# Patient Record
Sex: Male | Born: 1945
Health system: Southern US, Community
[De-identification: ages and names within clinical notes are randomized; demographics above are authoritative.]

## PROBLEM LIST (undated history)

## (undated) DIAGNOSIS — M1990 Unspecified osteoarthritis, unspecified site: Secondary | ICD-10-CM

## (undated) DIAGNOSIS — D869 Sarcoidosis, unspecified: Secondary | ICD-10-CM

## (undated) DIAGNOSIS — H9190 Unspecified hearing loss, unspecified ear: Secondary | ICD-10-CM

## (undated) DIAGNOSIS — G4730 Sleep apnea, unspecified: Secondary | ICD-10-CM

## (undated) DIAGNOSIS — N183 Chronic kidney disease, stage 3 unspecified: Secondary | ICD-10-CM

## (undated) DIAGNOSIS — I219 Acute myocardial infarction, unspecified: Secondary | ICD-10-CM

## (undated) DIAGNOSIS — D649 Anemia, unspecified: Secondary | ICD-10-CM

## (undated) DIAGNOSIS — I10 Essential (primary) hypertension: Secondary | ICD-10-CM

## (undated) DIAGNOSIS — I2510 Atherosclerotic heart disease of native coronary artery without angina pectoris: Secondary | ICD-10-CM

## (undated) DIAGNOSIS — I739 Peripheral vascular disease, unspecified: Secondary | ICD-10-CM

## (undated) DIAGNOSIS — G4733 Obstructive sleep apnea (adult) (pediatric): Secondary | ICD-10-CM

## (undated) DIAGNOSIS — E079 Disorder of thyroid, unspecified: Secondary | ICD-10-CM

## (undated) DIAGNOSIS — E2740 Unspecified adrenocortical insufficiency: Secondary | ICD-10-CM

## (undated) DIAGNOSIS — T7840XA Allergy, unspecified, initial encounter: Secondary | ICD-10-CM

## (undated) DIAGNOSIS — Z9289 Personal history of other medical treatment: Secondary | ICD-10-CM

## (undated) DIAGNOSIS — E119 Type 2 diabetes mellitus without complications: Secondary | ICD-10-CM

## (undated) DIAGNOSIS — E785 Hyperlipidemia, unspecified: Secondary | ICD-10-CM

## (undated) DIAGNOSIS — J189 Pneumonia, unspecified organism: Secondary | ICD-10-CM

## (undated) HISTORY — DX: Obstructive sleep apnea (adult) (pediatric): G47.33

## (undated) HISTORY — PX: INTERCOSTAL NERVE BLOCK: SHX5021

## (undated) HISTORY — DX: Sarcoidosis, unspecified: D86.9

## (undated) HISTORY — DX: Atherosclerotic heart disease of native coronary artery without angina pectoris: I25.10

## (undated) HISTORY — DX: Sleep apnea, unspecified: G47.30

## (undated) HISTORY — DX: Personal history of other medical treatment: Z92.89

## (undated) HISTORY — DX: Disorder of thyroid, unspecified: E07.9

## (undated) HISTORY — DX: Unspecified adrenocortical insufficiency: E27.40

## (undated) HISTORY — DX: Hyperlipidemia, unspecified: E78.5

## (undated) HISTORY — DX: Unspecified hearing loss, unspecified ear: H91.90

## (undated) HISTORY — PX: CORONARY ANGIOPLASTY: SHX604

## (undated) HISTORY — DX: Type 2 diabetes mellitus without complications: E11.9

## (undated) HISTORY — DX: Anemia, unspecified: D64.9

## (undated) HISTORY — PX: COLONOSCOPY: SHX174

## (undated) HISTORY — DX: Unspecified osteoarthritis, unspecified site: M19.90

## (undated) HISTORY — DX: Acute myocardial infarction, unspecified: I21.9

## (undated) HISTORY — DX: Peripheral vascular disease, unspecified: I73.9

## (undated) HISTORY — DX: Allergy, unspecified, initial encounter: T78.40XA

## (undated) HISTORY — PX: APPENDECTOMY: SHX54

---

## 2000-01-19 ENCOUNTER — Ambulatory Visit (HOSPITAL_COMMUNITY): Admission: RE | Admit: 2000-01-19 | Discharge: 2000-01-19 | Payer: Self-pay | Admitting: Gastroenterology

## 2000-01-19 ENCOUNTER — Encounter: Payer: Self-pay | Admitting: Gastroenterology

## 2002-01-09 ENCOUNTER — Ambulatory Visit (HOSPITAL_COMMUNITY): Admission: RE | Admit: 2002-01-09 | Discharge: 2002-01-09 | Payer: Self-pay | Admitting: Family Medicine

## 2002-01-09 ENCOUNTER — Encounter: Payer: Self-pay | Admitting: Family Medicine

## 2004-07-28 ENCOUNTER — Ambulatory Visit: Payer: Self-pay | Admitting: Gastroenterology

## 2004-08-17 ENCOUNTER — Ambulatory Visit: Payer: Self-pay | Admitting: Gastroenterology

## 2004-09-14 ENCOUNTER — Ambulatory Visit: Payer: Self-pay | Admitting: *Deleted

## 2004-09-14 IMAGING — CT CT ABDOMEN WO/W CM
2 of 5 series · 13 of 32 positions shown, 18 images · IV contrast (omnipaque)
Comparison: none

CLINICAL DATA: Abdominal pain.  Some weight loss.
 ABDOMEN CT WITHOUT AND WITH CONTRAST:
TECHNIQUE: Multidetector CT imaging of the abdomen was performed both before and during bolus administration of intravenous contrast.
 Contrast:  125 cc of Omnipaque 300.
 Initially, scans were performed in the unenhanced state.  No renal calculi are seen, and no calcified gallstones are noted.  
 After oral and IV contrast media were given, arterial and venous phase imaging was performed.  The small portion of the lung bases that are visualized do show some chronic change and atelectasis or scarring.  However, there is suspicion of several small nodules at both lung bases, and CT of the chest is recommended to assess for possible metastatic involvement.  The liver enhances normally with no focal abnormality, and no ductal dilatation is seen.  There are a few small nodes within the upper abdomen primarily within the epicardial fat to the right of midline, none of which are pathologically enlarged.  Also, there is a rounded soft tissue mass just above the right adrenal gland.  I do not believe this emanates from a rim of the adrenal gland and would favor this to representing a slightly prominent right upper quadrant node near the adrenal gland of approximately 11 x 14 mm on arterial image #19.  There are no calcified gallstones seen.  The pancreas is normal in size and configuration with normal peripancreatic fat planes.  The adrenal glands both appear normal size.  There also is slightly prominent node just medial and inferior to the left adrenal gland on image #31 of 11 x 18 mm.  Prominent node is just medial to the IVC near the junction with the right atrium on image #10 of 21 x 12 mm with smaller adjacent nodes present as well.  There are nodes extending from the porta hepatis toward the head of the pancreas.  On image #30, there is a node of 15 x 23 mm.  Although these nodes may be hyperplastic, a neoplastic process is a consideration.  The spleen is normal in size.  The kidneys enhance normally and, on delayed images, the pelvocaliceal systems appear normal.  Abdominal aorta is normal in caliber.  On the portal venous phase, there does appear to be some fatty infiltration of the liver without focal hepatic abnormality noted.  A right mid anterior renal cyst is noted.

[Series 2: non_contrast 5.0 b30f st · axial · 0.83mm/px · z∈[-304,-79]mm · 7 of 61 slices shown]
[im 8/61  soft-tissue]
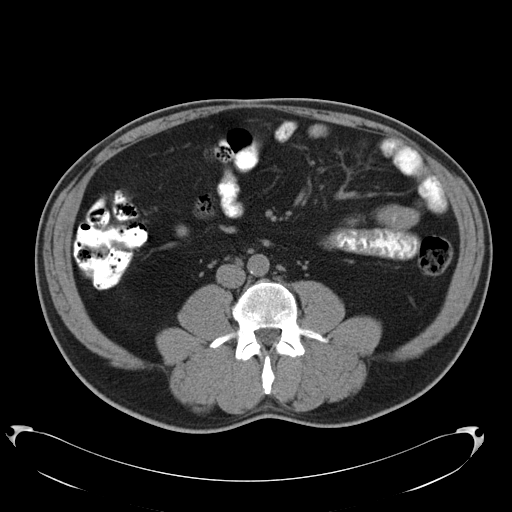
[im 16/61  soft-tissue]
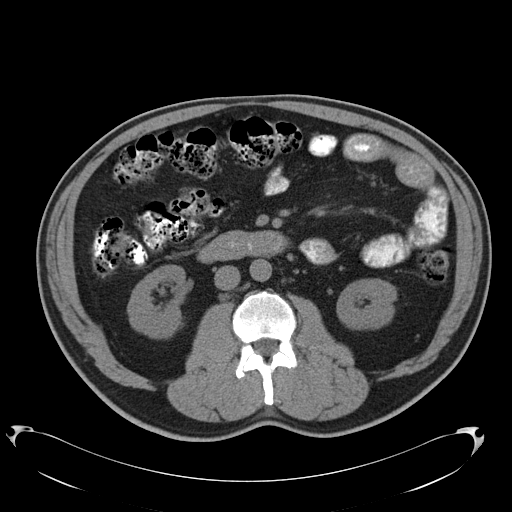
[im 23/61  soft-tissue]
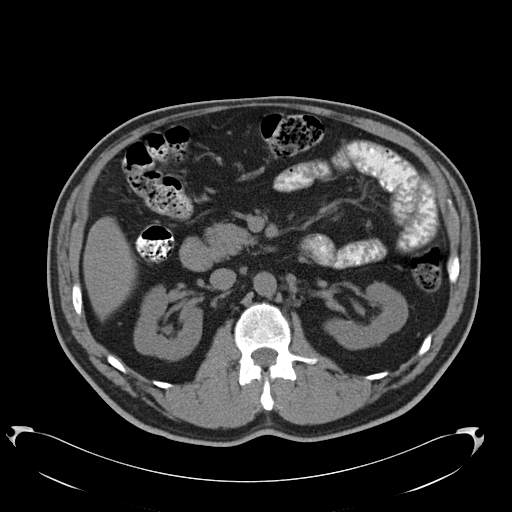
[im 31/61  soft-tissue]
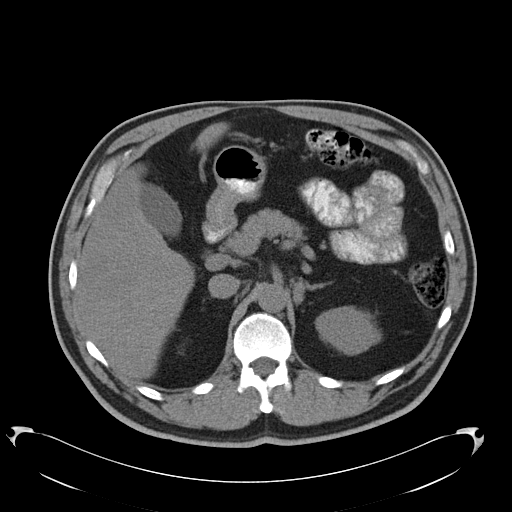
[im 38/61  soft-tissue]
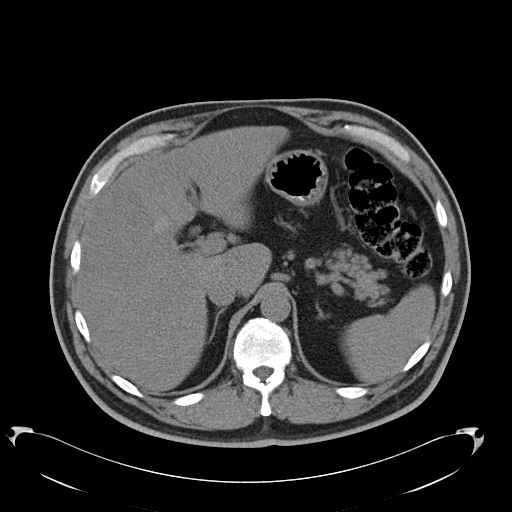
[im 46/61  soft-tissue]
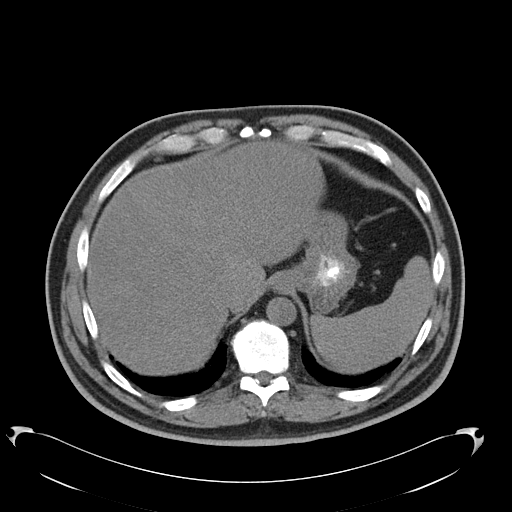
[im 53/61  soft-tissue]
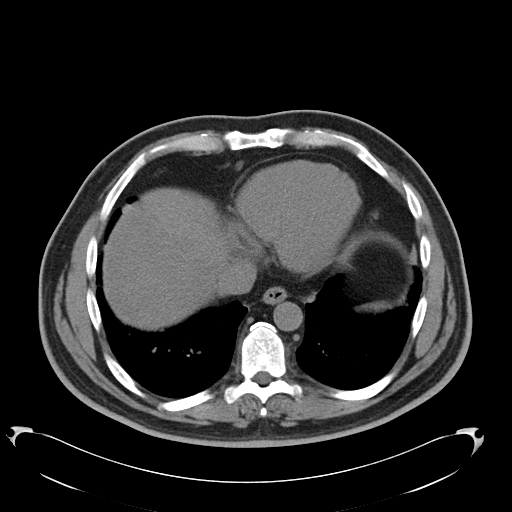

[Series 6: ap_venous 5.0 b30f · axial · 0.82mm/px · z∈[-296,-86]mm · 6 of 60 slices shown, 11 images]
[im 9/60  soft-tissue]
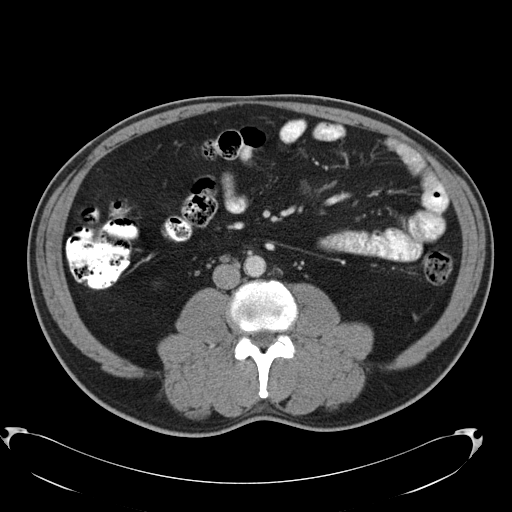
[im 9/60  bone]
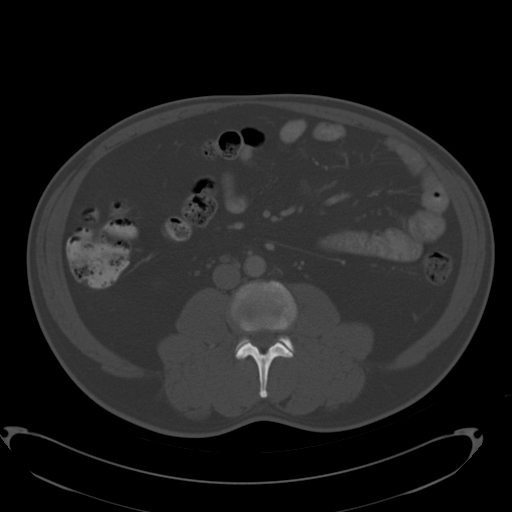
[im 17/60  soft-tissue]
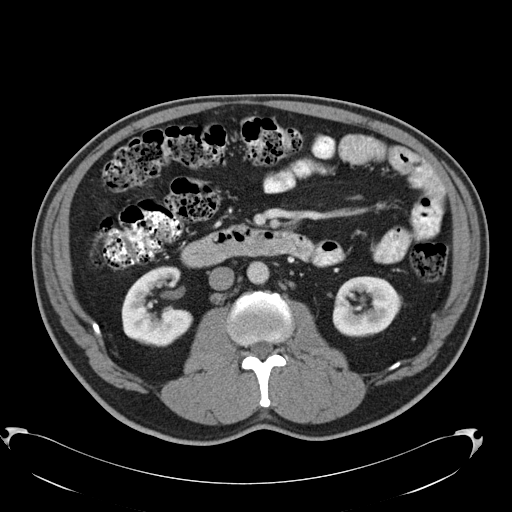
[im 26/60  soft-tissue]
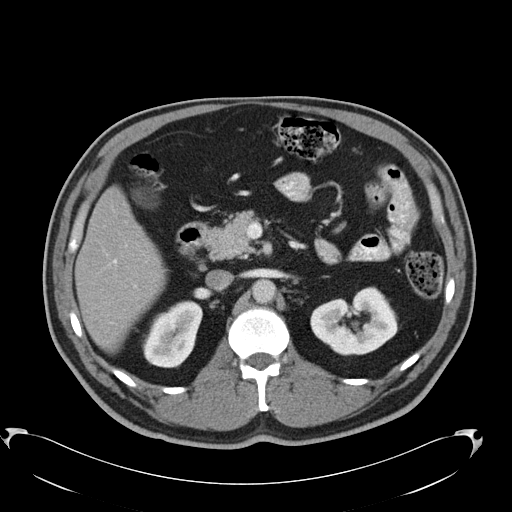
[im 26/60  lung]
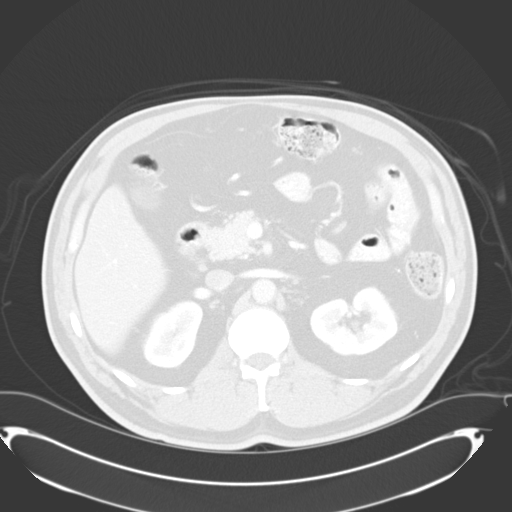
[im 34/60  soft-tissue]
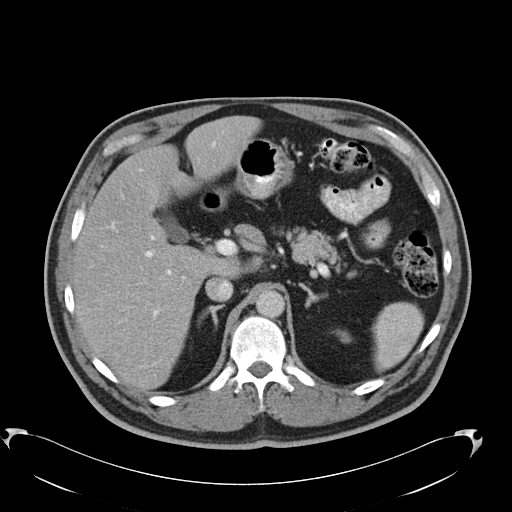
[im 34/60  lung]
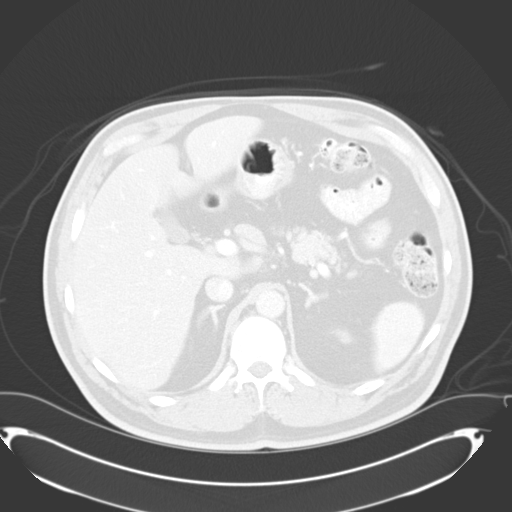
[im 43/60  soft-tissue]
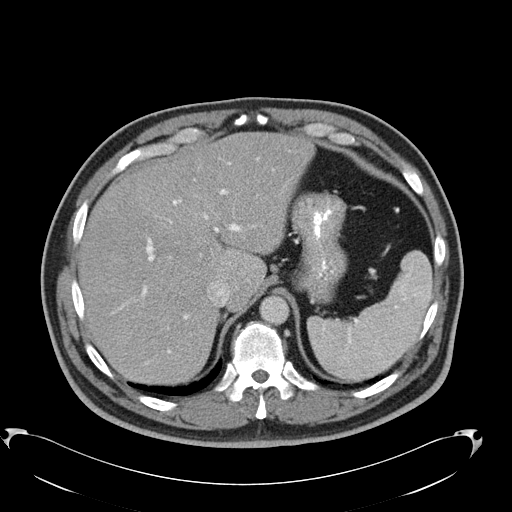
[im 43/60  lung]
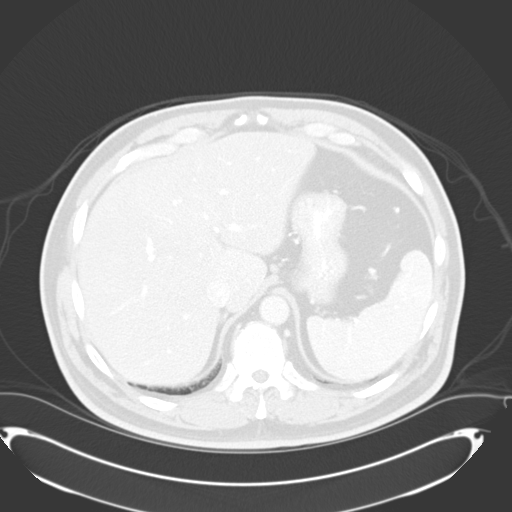
[im 51/60  soft-tissue]
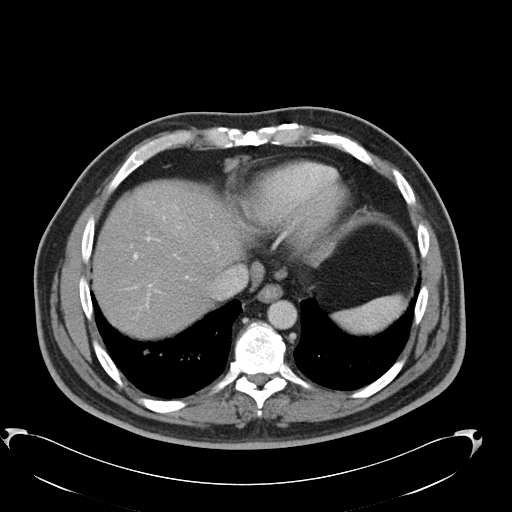
[im 51/60  lung]
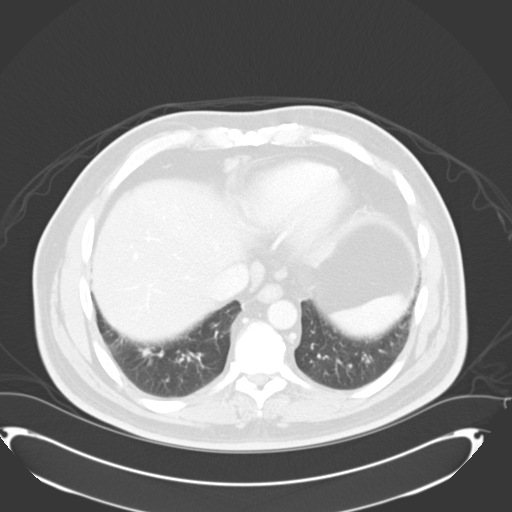

[13 of 32 positions shown; findings below may reference images not displayed]

IMPRESSION: 1.  There are somewhat prominent nodes, particularly in the upper abdomen, the largest of which are posterior to the portal vein.   These could be hyperplastic, but neoplastic process is a definite consideration.  No other intraabdominal mass is seen.
 2.  Chronic changes are present at the bases but there is suspicion of small nodules present as well which could be metastatic.  Suggest CT of the chest.  
 3.  Mild fatty infiltration of the liver.

## 2004-09-22 ENCOUNTER — Ambulatory Visit: Payer: Self-pay | Admitting: Cardiology

## 2004-09-22 IMAGING — CT CT CHEST W/ CM
1 of 2 series · 14 of 30 positions shown, 18 images · IV contrast (omnipaque)
Comparison: Abdomen CT dated [DATE].

CLINICAL DATA: prominent nodules seen on abdominal ct from [DATE]
CHEST CT WITH CONTRAST:
TECHNIQUE: Multidetector CT imaging of the chest was performed following the standard protocol during bolus administration of intravenous contrast.
Contrast:  80 cc Omnipaque 300.

[Series 2: chest_routine 5.0 b40f st · axial · 0.65mm/px · z∈[-242,+48]mm · 14 of 64 slices shown, 18 images]
[im 3/64  mediastinal]
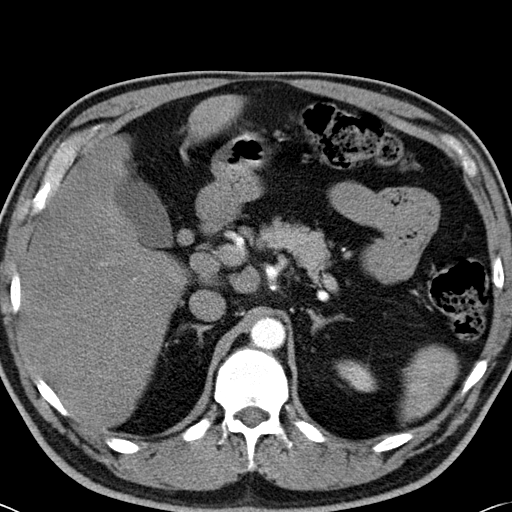
[im 3/64  lung]
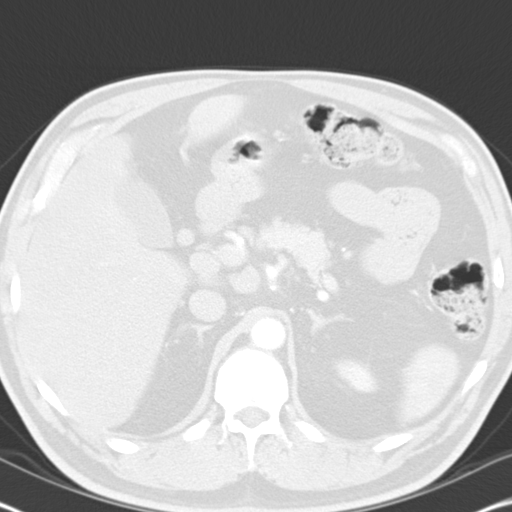
[im 8/64  lung]
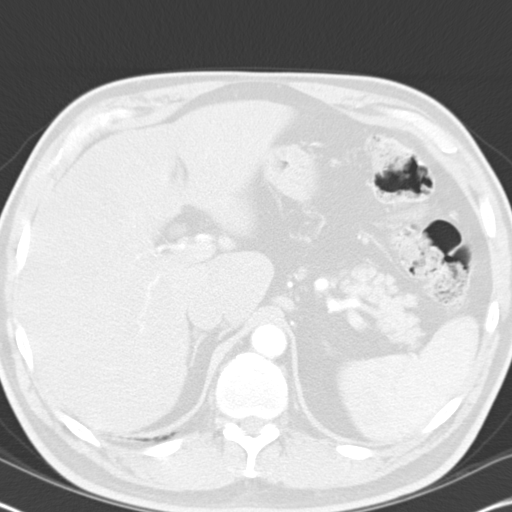
[im 13/64  lung]
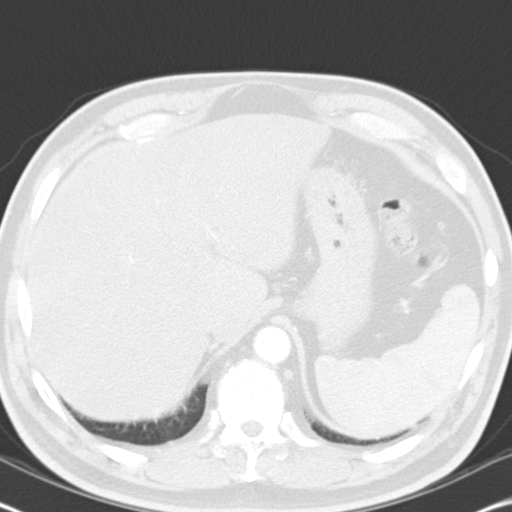
[im 18/64  lung]
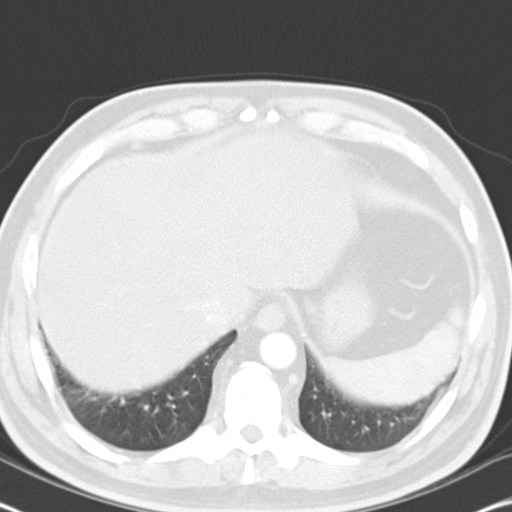
[im 23/64  mediastinal]
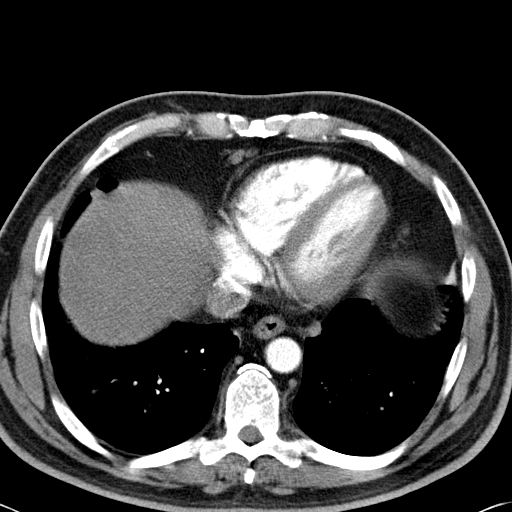
[im 23/64  lung]
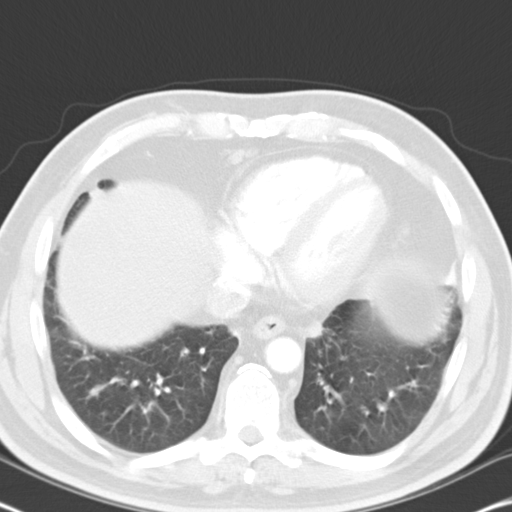
[im 28/64  lung]
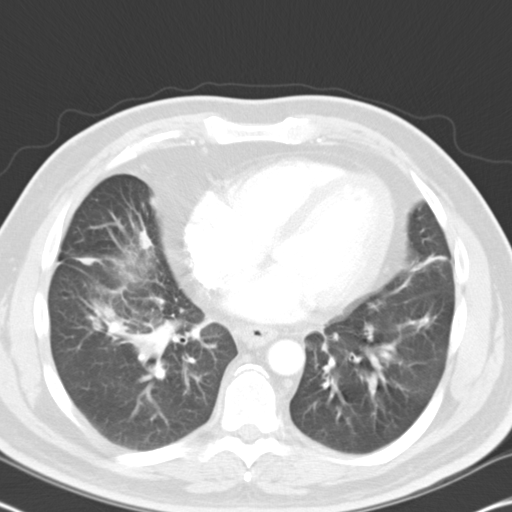
[im 31/64  lung]
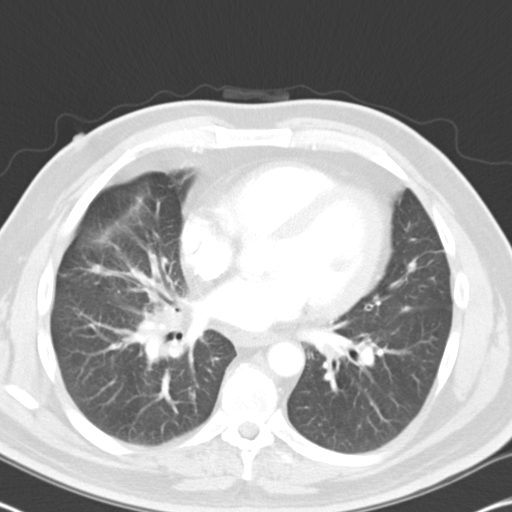
[im 32/64  lung]
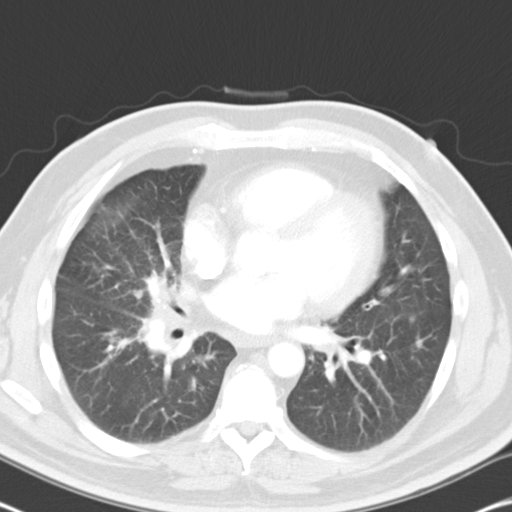
[im 36/64  mediastinal]
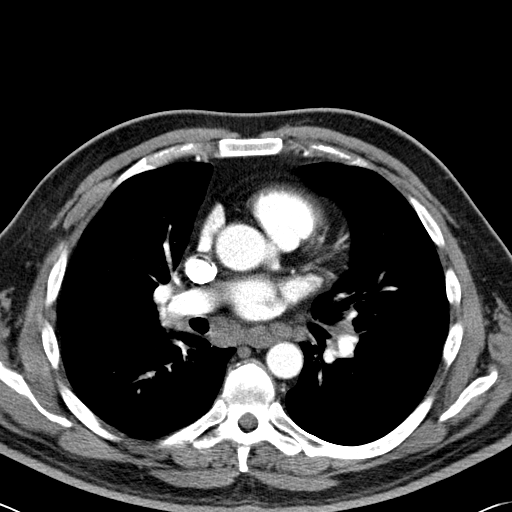
[im 36/64  lung]
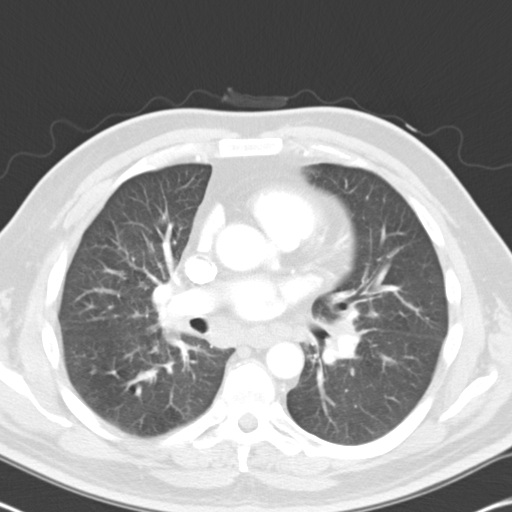
[im 41/64  lung]
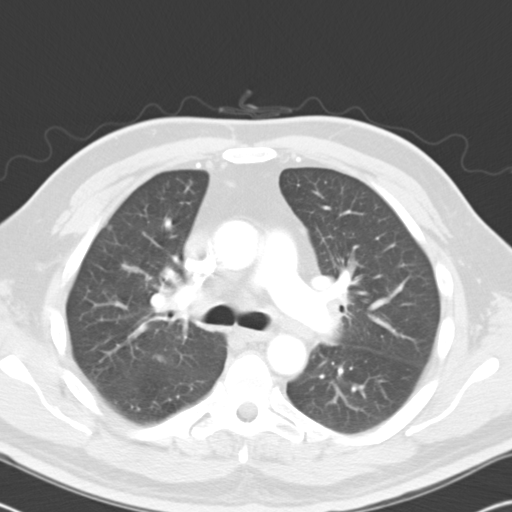
[im 46/64  lung]
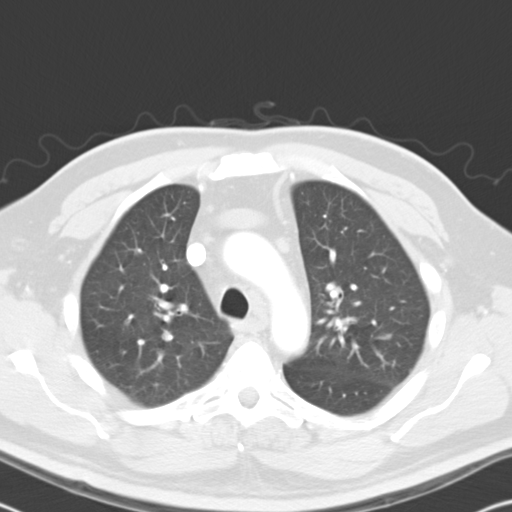
[im 51/64  lung]
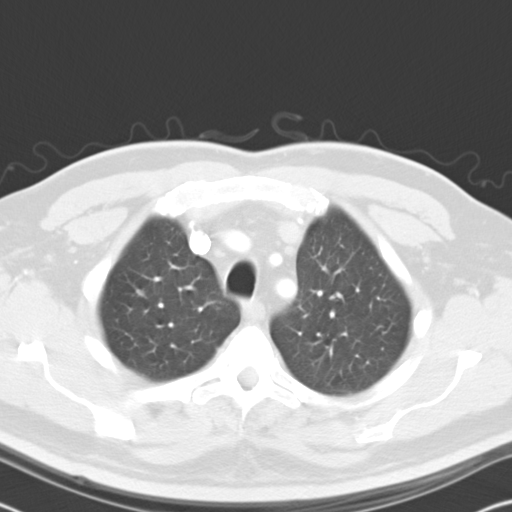
[im 56/64  mediastinal]
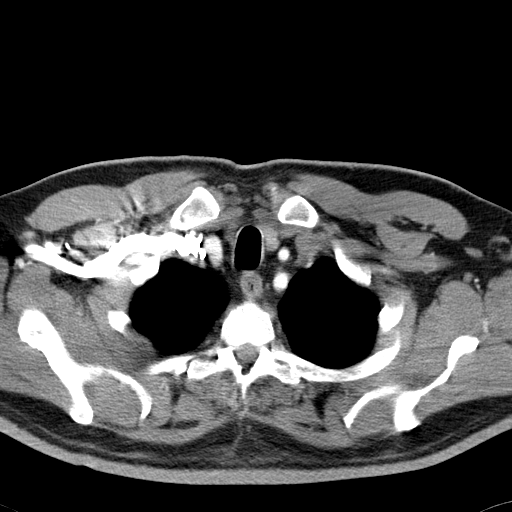
[im 56/64  lung]
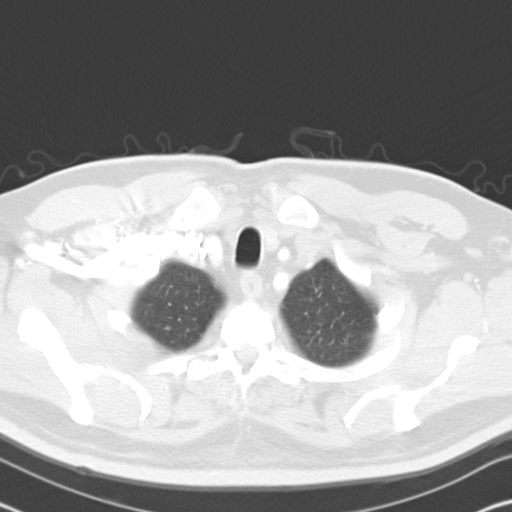
[im 61/64  lung]
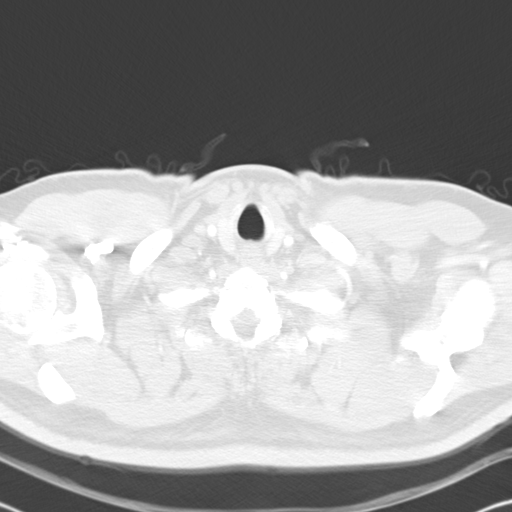

[14 of 30 positions shown; findings below may reference images not displayed]

FINDINGS: No pathologically enlarged axillary lymphadenopathy.  The thyroid gland enhances homogeneously without focal nodularity.  
Multiple prevascular lymph nodes are subcentimeter. There are also multiple superior mediastinal and right paratracheal lymph nodes which are also subcentimeter. Pre-carinal lymph node (image 22) measures 13.4 x 14.0 mm (image 22). 
Bilateral hilar lymphadenopathy is noted.  For reference purposes, the right hilar lymph node measures 20.2 x 12.5 mm (image 34).  Left hilar lymph node measures 15.1 x 13.7 (image 25). Subcarinal lymph node measures 1.8 x 2.3 cm (image 27). Several subcentimeter cardiophrenic lymph nodes are noted (image 43).  
There is no pericardial effusion.  No pleural effusions. 
Right middle lobe nodule (image 33) measures 5.9 x 5.9 mm.  Faint subpleural nodule within the right upper lobe (image 24) measures less than 5 mm.  Right lower lobe nodule (image 38) measures 6.8 x 4.7 mm.  Within the right lower lobe lung base anteriorly, there is subsegmental atelectasis with several nodular opacities, the largest of which measures 10 x 7.9 mm (image 37).  
  Subsegmental atelectasis and scarring are also noted within the left lung base where several irregular nodular opacities are also noted.  
Limited images of the upper abdomen again demonstrate multiple lymph nodes within the peripancreatic, aortocaval region and periaortic region.
IMPRESSION: 1.  Multiple lymph nodes within the mediastinum and bilateral hilar regions. The majority of these are nonpathologic by strict size criteria however; several of these, including precarinal and right hilar lymph nodes are enlarged. These may be inflammatory or post infectious in etiology, but neoplastic process is still a definite consideration.  
2. Subsegmental atelectasis and inflammatory type changes most prominent in the lung bases. There are scattered nodular opacities within the right middle lobe and right lower lobe. Findings are nonspecific but could be either infectious or inflammatory in etiology; however, metastatic disease is of primary concern given the lymphadenopathy noted in the abdomen. 
3.  A PET CT scan of the chest, abdomen and pelvis may be of value in evaluating the degree of metabolic activity within the lymph nodes and to assess the likelihood of malignancy.

## 2004-10-05 ENCOUNTER — Ambulatory Visit: Payer: Self-pay | Admitting: Gastroenterology

## 2004-10-06 ENCOUNTER — Ambulatory Visit: Payer: Self-pay | Admitting: Gastroenterology

## 2004-10-08 ENCOUNTER — Ambulatory Visit (HOSPITAL_COMMUNITY): Admission: RE | Admit: 2004-10-08 | Discharge: 2004-10-08 | Payer: Self-pay | Admitting: Gastroenterology

## 2004-10-08 IMAGING — PT NM PET TUM IMG SKULL BASE T - THIGH
4 series · 25 of 25 positions shown · non-contrast
Comparison: CT scan [DATE].

CLINICAL DATA: Lymphoma.  Pulmonary nodules on recent CT scan along with mediastinal adenopathy.
 FDG PET-CT TUMOR IMAGING (SKULL BASE TO THIGHS) ? [DATE]: 
 Fasting Blood Glucose:  198.
TECHNIQUE: 18.1 mCi F18-FDG were administered via the right antecubital vein.  Full ring PET imaging was performed from the skull base through the mid-thighs 76 minutes after injection.  CT data was obtained and used for attenuation correction and anatomic localization only.  (This was not acquired as a diagnostic CT examination).

[Series 1: pet ac · axial · 3.3mm · 4.69mm/px · z∈[-1022,-8]mm · 8 of 311 slices shown]
[im 1/311]
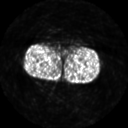
[im 45/311]
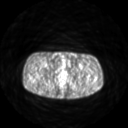
[im 89/311]
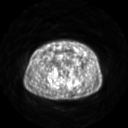
[im 133/311]
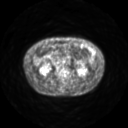
[im 178/311]
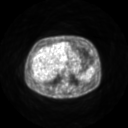
[im 222/311]
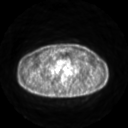
[im 266/311]
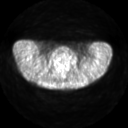
[im 311/311]
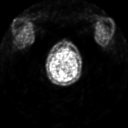

[Series 2: pet nac · axial · 3.3mm · 4.69mm/px · z∈[-1022,-8]mm · 8 of 311 slices shown]
[im 1/311]
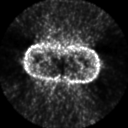
[im 45/311]
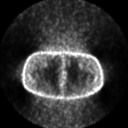
[im 89/311]
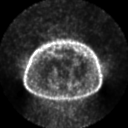
[im 133/311]
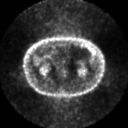
[im 178/311]
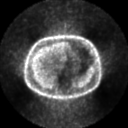
[im 222/311]
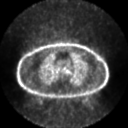
[im 266/311]
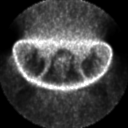
[im 311/311]
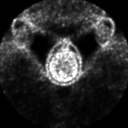

[Series 2: ct images · axial · 3.8mm · 0.98mm/px · z∈[-1022,-8]mm · 8 of 311 slices shown]
[im 1/311]
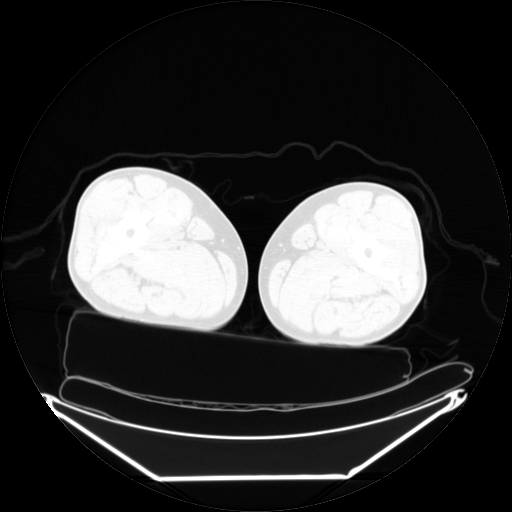
[im 45/311]
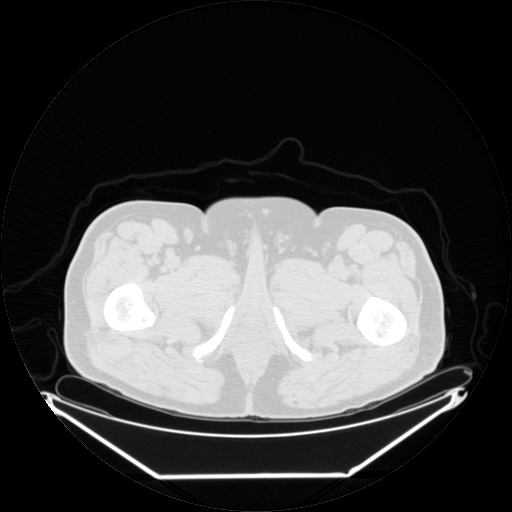
[im 89/311]
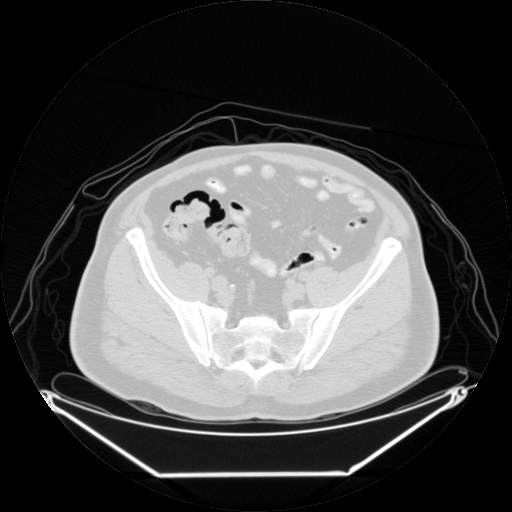
[im 133/311]
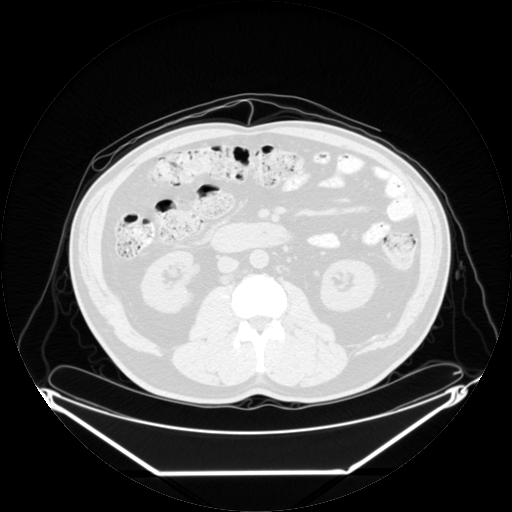
[im 178/311]
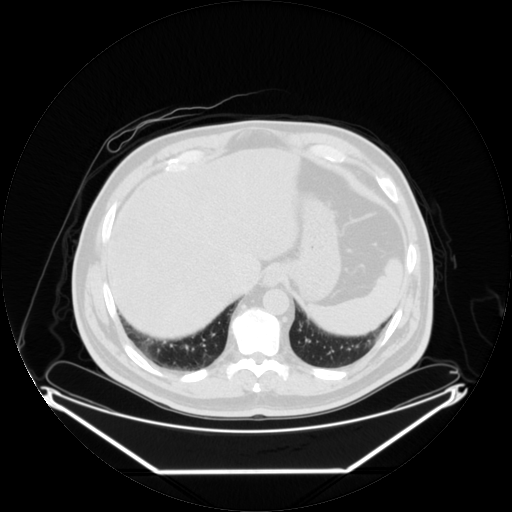
[im 222/311]
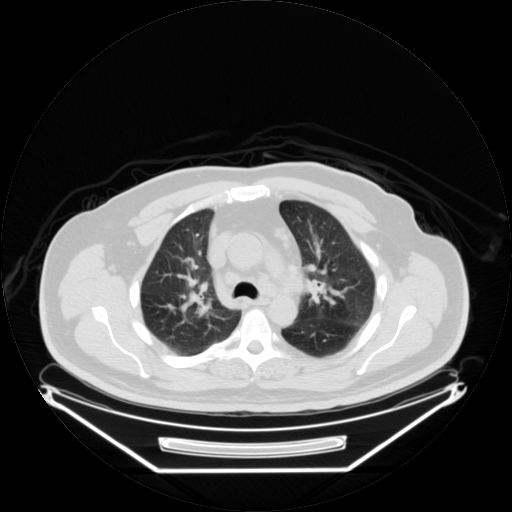
[im 266/311]
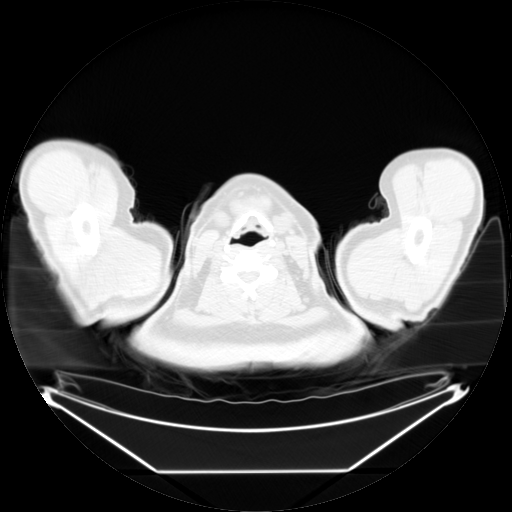
[im 311/311  brain]
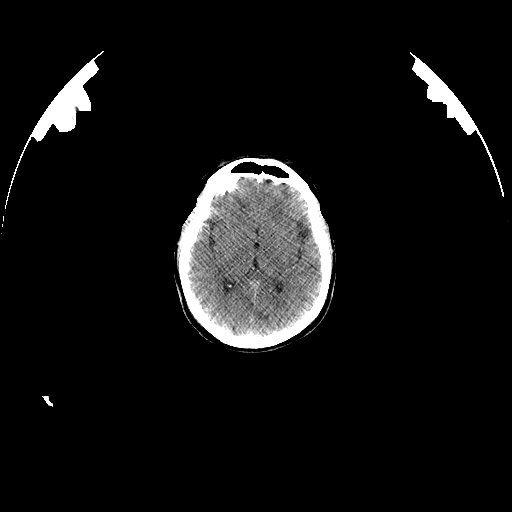

[Series 123: mip · coronal · 3.3mm · 4.69mm/px · 1 of 30 slices shown]
[im 1/30]
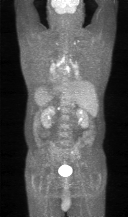

[25 of 25 positions shown; findings below may reference images not displayed]

FINDINGS: CT data reveals ethmoid and maxillary sinusitis as well as scattered mediastinal and hilar nodes and retroperitoneal adenopathy.
 A right-sided supraclavicular node measures 1.4 cm in maximum diameter, and has a maximum standard uptake value of 5.8 g/ml.  A left supraclavicular node has a standard uptake value of 4.2 g/ml.  
 A lymph node just to the left of the left mainstem bronchus has a maximum standard uptake value of 9.1 g/ml.  A subcarinal node has a maximum standard uptake value of 8.0 g/ml.  There is increased activity in the left hilar nodes, one of which has a maximum standard uptake value of 7.3 g/ml, as well as some increased right hilar activity.  
 There are scattered small retroperitoneal lymph nodes.  A left para-aortic node measures 2.0 x 1.2 cm in size and has a maximum standard uptake value of 6.7 g/ml.  Precaval nodes are enlarged and have only mildly increased activity.  Celiac trunk nodes are mildly prominent but not high in activity.  Right basilar pulmonary nodularity appears stable when compared to the prior examination, although none of these nodules have demonstrably increased FDG avidity and thus they may well simply be postinflammatory in nature.
 No abnormal activity is identified in the neck.
IMPRESSION: 1.  Abnormal activity in mediastinal lymph nodes and in several of the retroperitoneal lymph nodes.  In this patient with a history of lymphoma, these are most likely to represent residual/recurrent lymphoma.  There are also small but metabolically active supraclavicular lymph nodes.
 2.  Nodules at the right lung base do not demonstrate increased activity and thus may simply be postinflammatory in nature.  They appear stable compared to the prior examination.

## 2004-10-25 ENCOUNTER — Ambulatory Visit: Payer: Self-pay | Admitting: Gastroenterology

## 2004-11-01 ENCOUNTER — Ambulatory Visit: Payer: Self-pay | Admitting: Internal Medicine

## 2004-11-01 ENCOUNTER — Encounter (INDEPENDENT_AMBULATORY_CARE_PROVIDER_SITE_OTHER): Payer: Self-pay | Admitting: *Deleted

## 2004-11-01 ENCOUNTER — Ambulatory Visit (HOSPITAL_COMMUNITY): Admission: RE | Admit: 2004-11-01 | Discharge: 2004-11-01 | Payer: Self-pay | Admitting: Gastroenterology

## 2004-11-04 ENCOUNTER — Ambulatory Visit: Payer: Self-pay | Admitting: Gastroenterology

## 2004-11-16 ENCOUNTER — Ambulatory Visit (HOSPITAL_COMMUNITY): Admission: RE | Admit: 2004-11-16 | Discharge: 2004-11-16 | Payer: Self-pay | Admitting: Gastroenterology

## 2004-11-16 ENCOUNTER — Encounter (INDEPENDENT_AMBULATORY_CARE_PROVIDER_SITE_OTHER): Payer: Self-pay | Admitting: *Deleted

## 2004-11-16 ENCOUNTER — Encounter: Payer: Self-pay | Admitting: Gastroenterology

## 2004-11-23 ENCOUNTER — Ambulatory Visit: Payer: Self-pay | Admitting: Critical Care Medicine

## 2004-12-15 ENCOUNTER — Ambulatory Visit: Payer: Self-pay | Admitting: Critical Care Medicine

## 2004-12-16 ENCOUNTER — Ambulatory Visit: Payer: Self-pay | Admitting: Critical Care Medicine

## 2005-01-13 ENCOUNTER — Ambulatory Visit: Payer: Self-pay | Admitting: Critical Care Medicine

## 2005-02-23 ENCOUNTER — Ambulatory Visit: Payer: Self-pay | Admitting: Critical Care Medicine

## 2005-03-14 ENCOUNTER — Ambulatory Visit: Payer: Self-pay | Admitting: Critical Care Medicine

## 2005-05-13 ENCOUNTER — Ambulatory Visit: Payer: Self-pay | Admitting: Critical Care Medicine

## 2005-07-06 ENCOUNTER — Ambulatory Visit: Payer: Self-pay | Admitting: Critical Care Medicine

## 2005-09-21 ENCOUNTER — Ambulatory Visit: Payer: Self-pay | Admitting: Critical Care Medicine

## 2006-02-10 IMAGING — CR ABDOMEN
3 series · 3 of 3 positions shown · non-contrast
Comparison: none

EXAM: ABDOMEN LIMITED SURVEY STUDY
ORD.PHYS: THACKER

BILIARY DIL Comment to Radiology:.... RIGHT UPPER QUADRANT ABDOMEN
ULTRASOUND R/O BILIARY DILATION,OBTRUCTION
The gallbladder is larger than on the prior exam of [DATE] but still
does not appear significantly distended. There is noted thickening of
the gallbladder wall which appears edematous. This type edematous
appearance of the gallbladder wall can be secondary to acute
cholecystitis which would be the most likely etiology in view of the
multiple gallstones that are visualized. Other etiologies for
thickening of the gallbladder wall include congestive failure, low serum
protein and adjacent inflammation such as pancreatitis. The common bile
duct measures 5.5 mm at greatest diameter which is within normal limits.
 Trace ascites is present.

[view not recorded (1 of 3)]
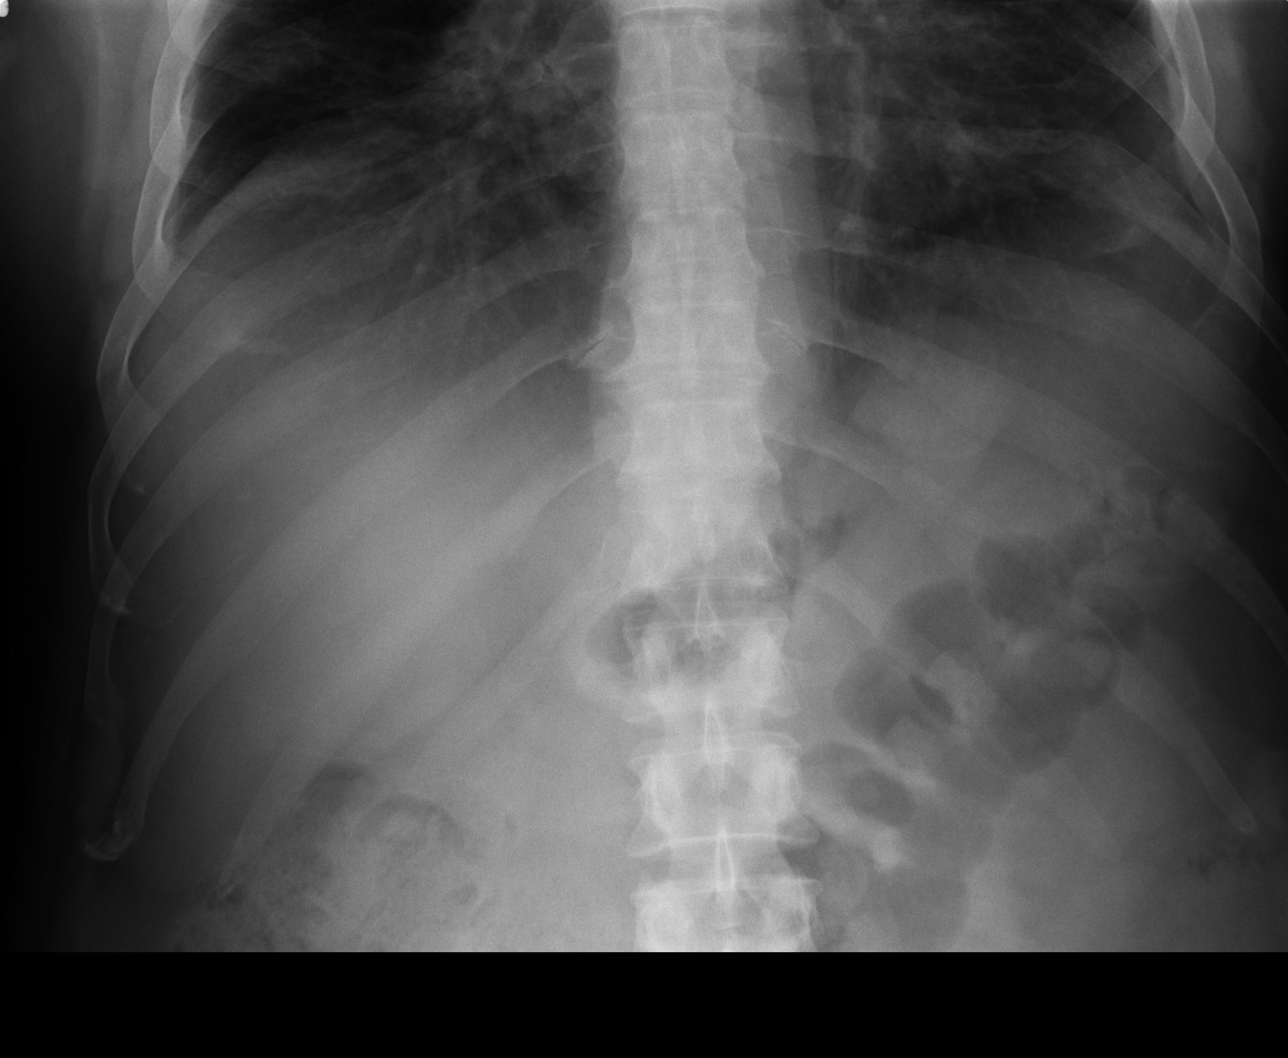

[view not recorded (2 of 3)]
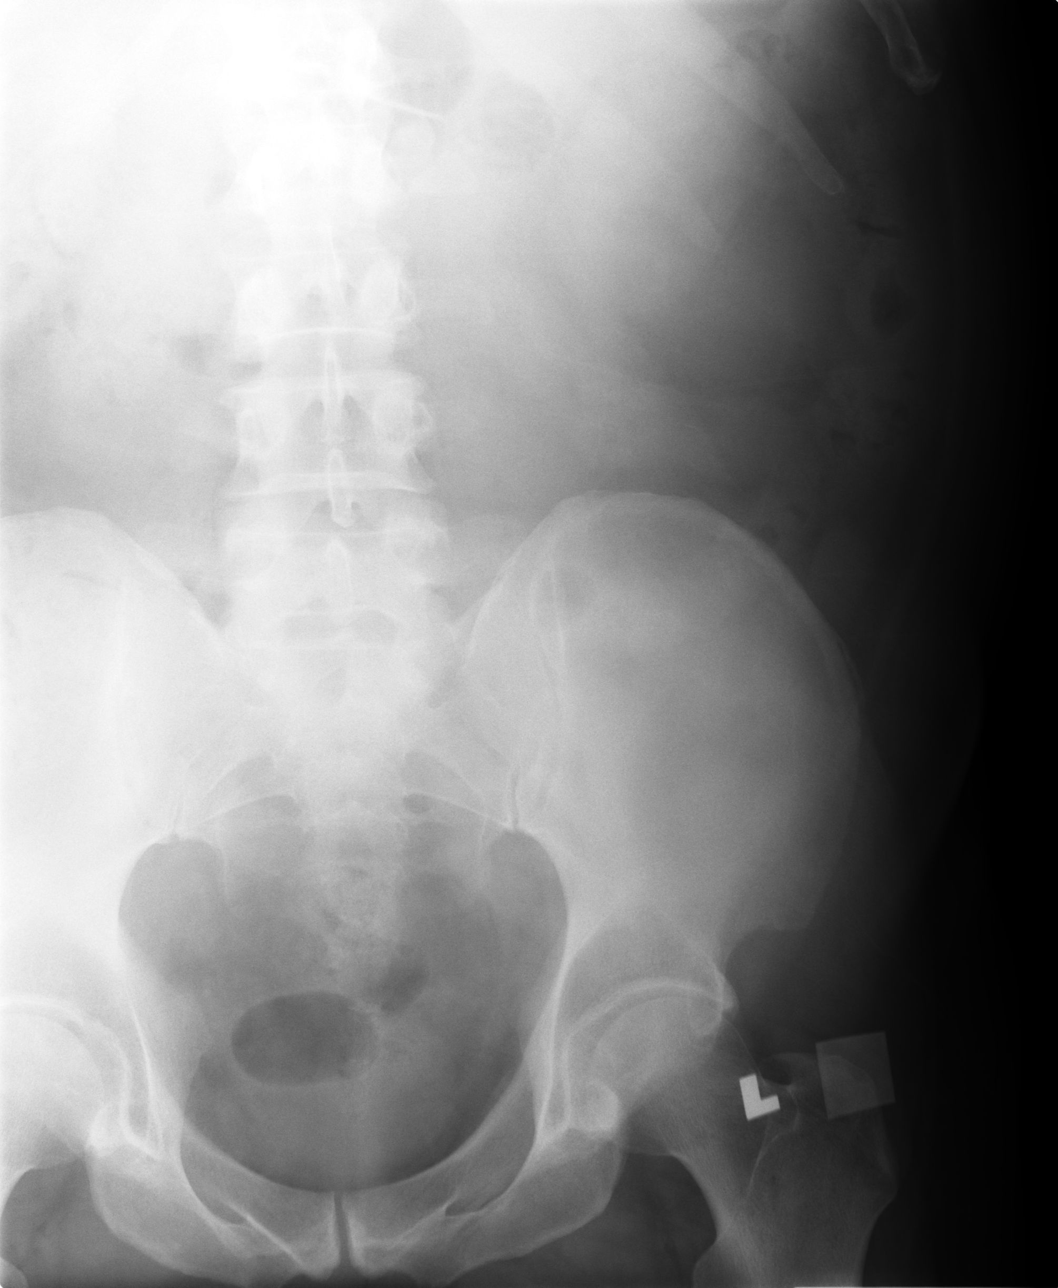

[view not recorded (3 of 3)]
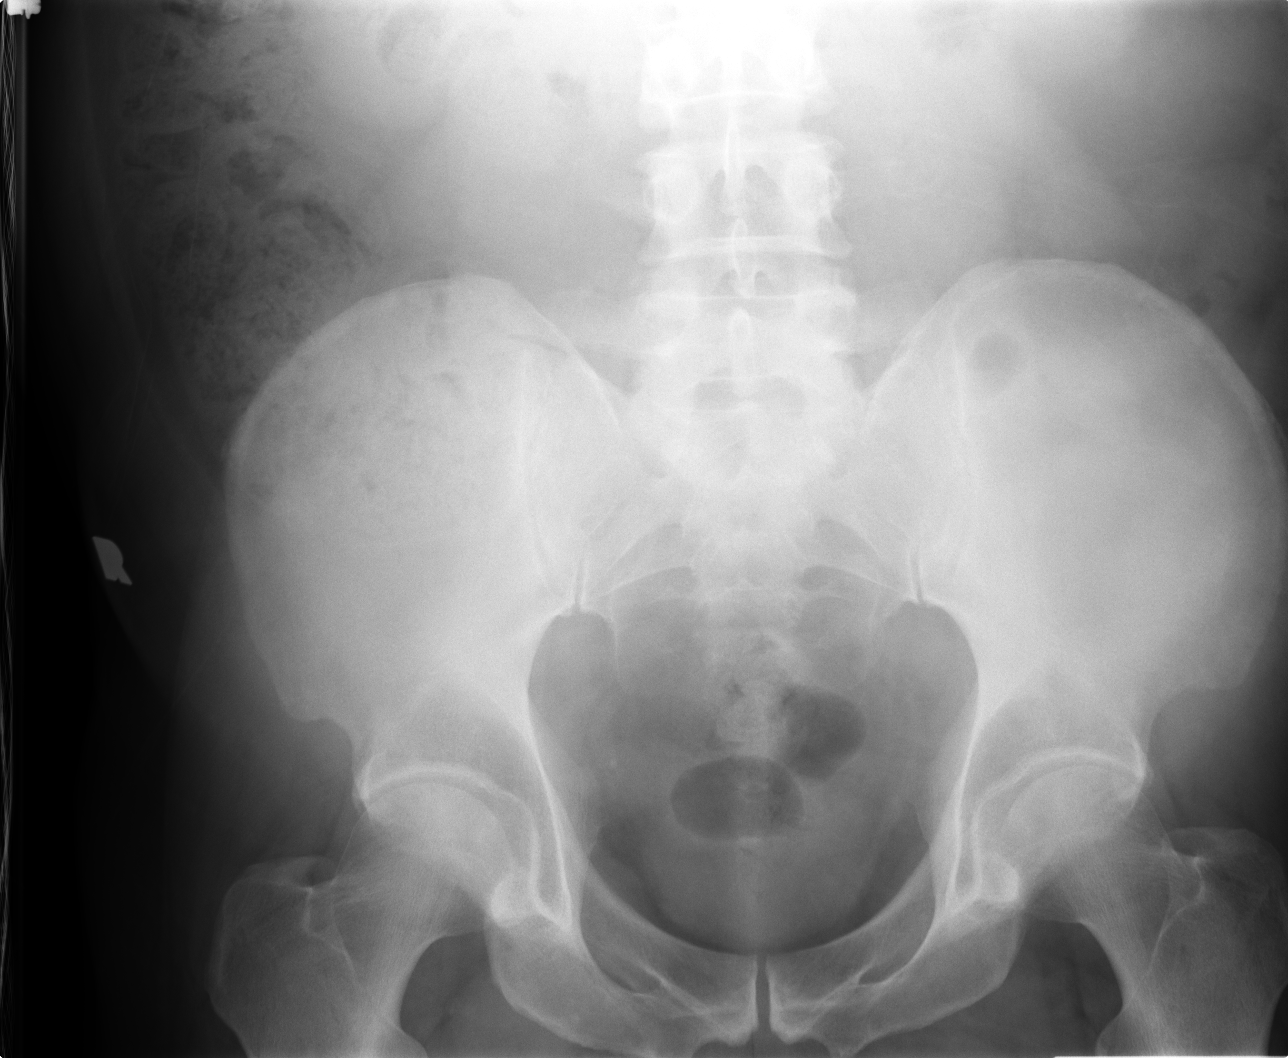

[3 of 3 positions shown; findings below may reference images not displayed]

CONCLUSION: p>RADIOLOGY REPORT

1. There are again noted gallstones and thickening of the gallbladder
 wall. The changes are suspicious for acute cholecystitis.

p>

## 2006-03-01 ENCOUNTER — Ambulatory Visit: Payer: Self-pay | Admitting: Critical Care Medicine

## 2006-03-03 ENCOUNTER — Ambulatory Visit: Payer: Self-pay | Admitting: Cardiology

## 2006-03-03 IMAGING — CT CT CHEST W/ CM
2 of 5 series · 12 of 36 positions shown, 15 images · IV contrast (omnipaque)
Comparison: Comparison is made with CT from [DATE].

CLINICAL DATA: Sarcoidosis. Evaluate mediastinal adenopathy.
 CHEST CT WITH CONTRAST:
TECHNIQUE: Multidetector CT imaging of the chest was performed following the standard protocol during bolus administration of intravenous contrast.
 Contrast:    80 mL Omnipaque 300 IV.

[Series 2: chest_rout_xxl 5.0 b31f st · axial · 0.77mm/px · z∈[-313,-73]mm · 9 of 62 slices shown, 12 images]
[im 7/62  mediastinal]
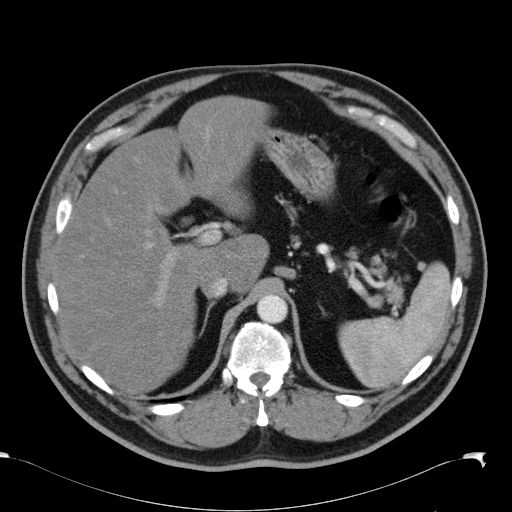
[im 7/62  lung]
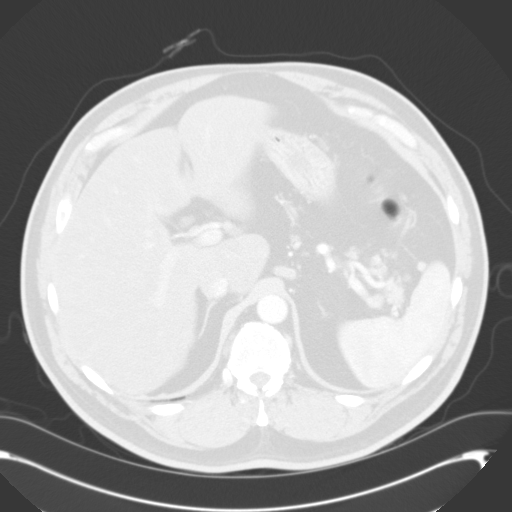
[im 13/62  lung]
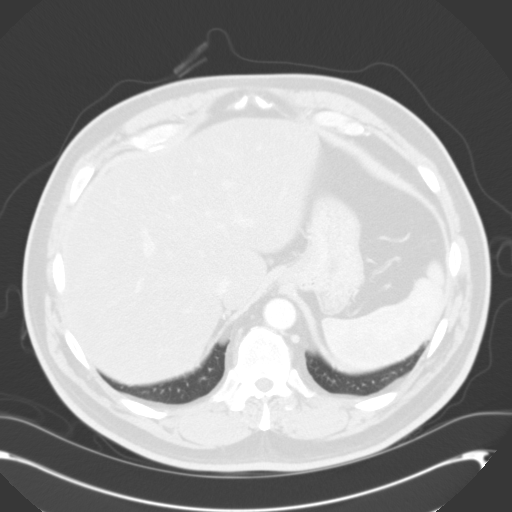
[im 19/62  lung]
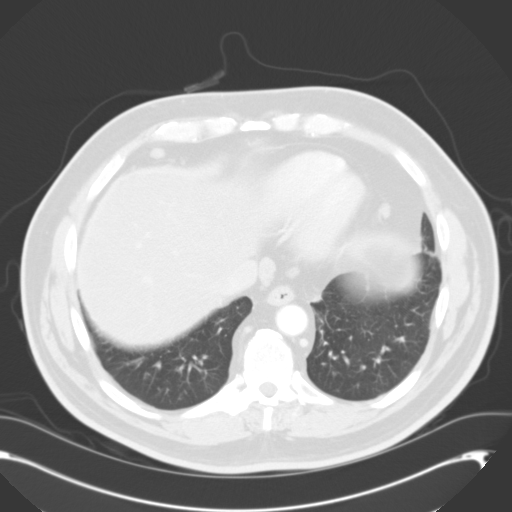
[im 25/62  lung]
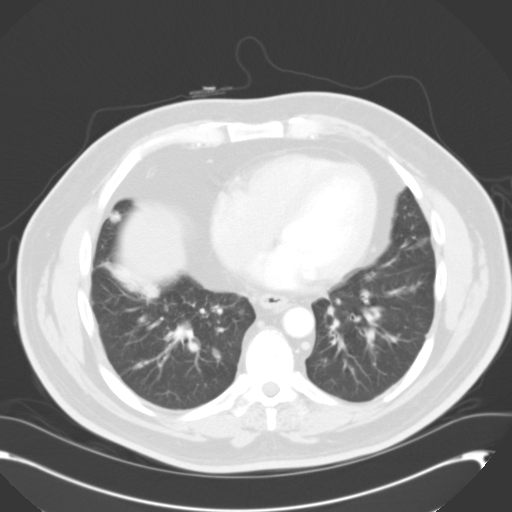
[im 31/62  mediastinal]
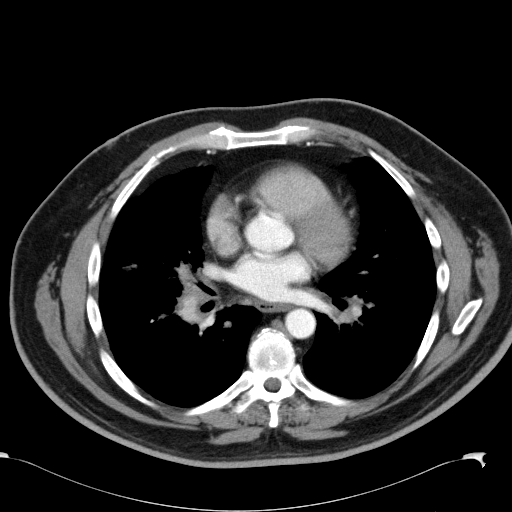
[im 31/62  lung]
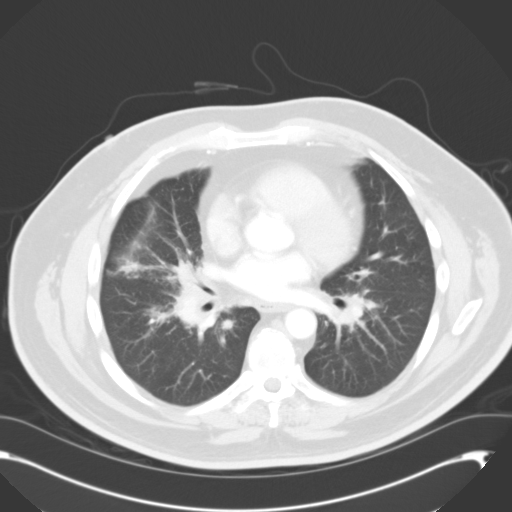
[im 37/62  lung]
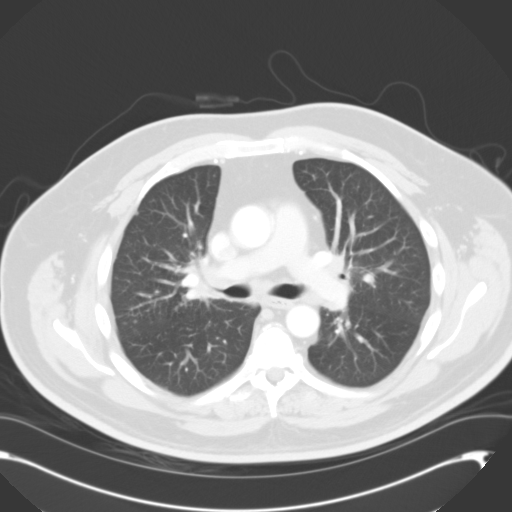
[im 43/62  lung]
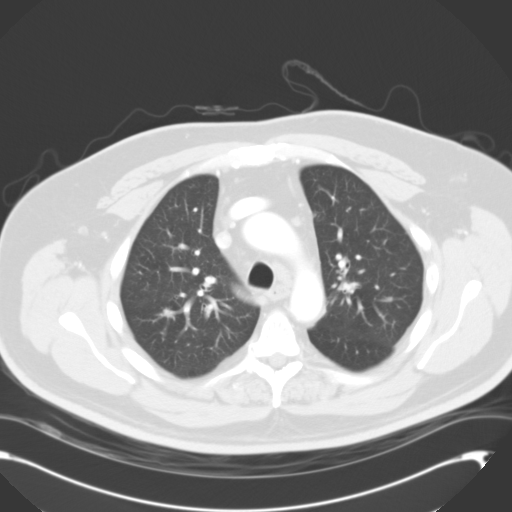
[im 49/62  lung]
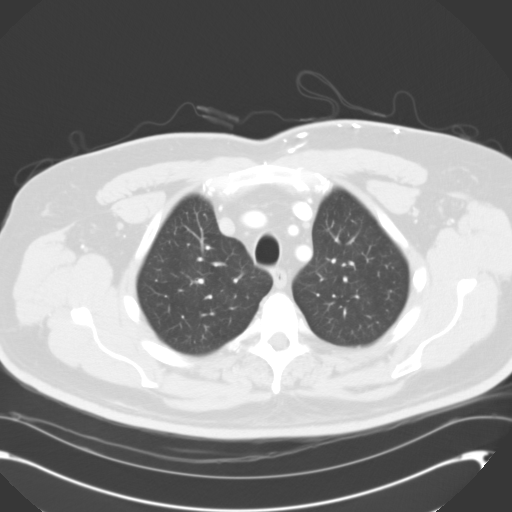
[im 55/62  mediastinal]
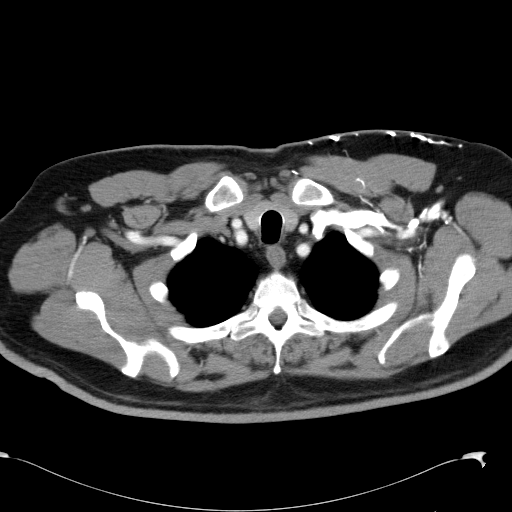
[im 55/62  lung]
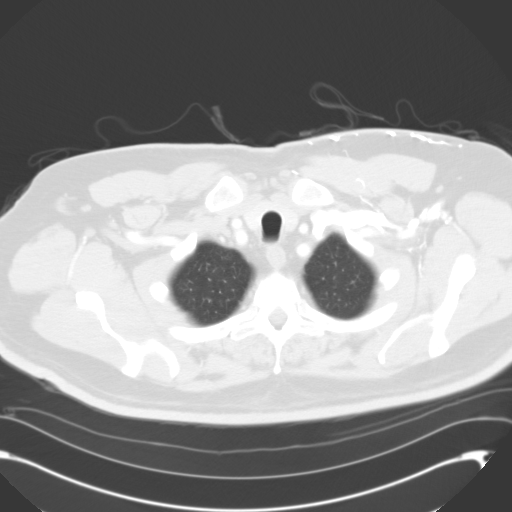

[Series 602: <mpr range> · coronal · 0.77mm/px · 3 of 82 slices shown]
[im 17/82  lung]
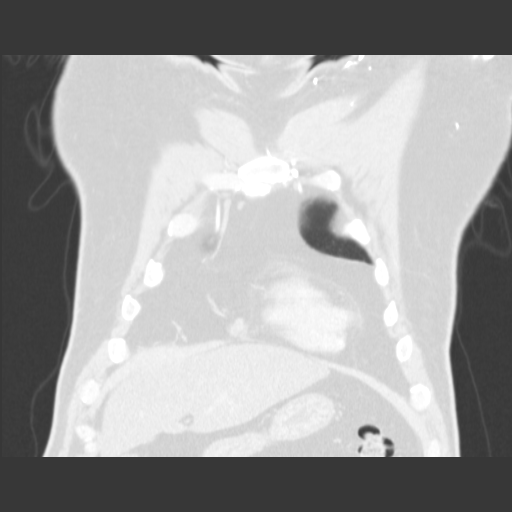
[im 33/82  lung]
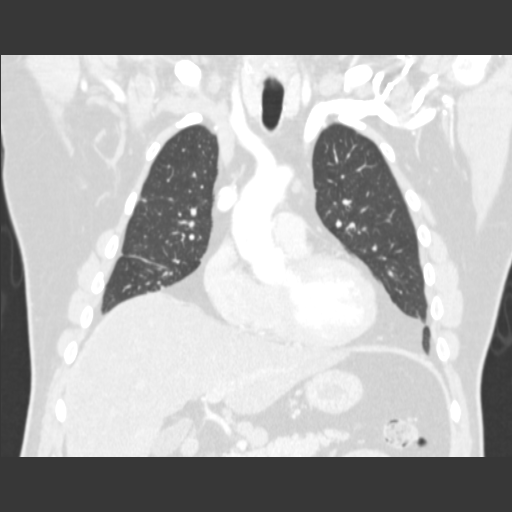
[im 49/82  lung]
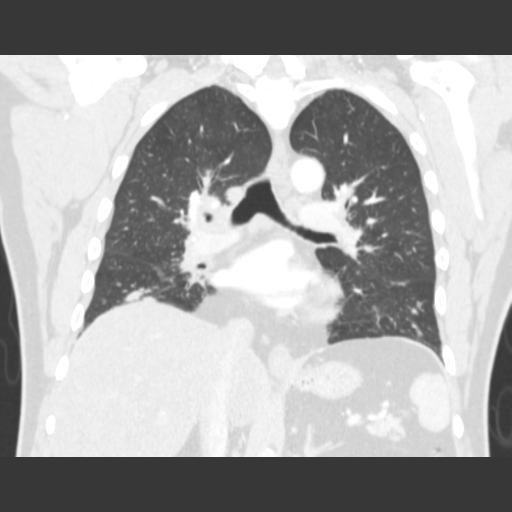

[12 of 36 positions shown; findings below may reference images not displayed]

FINDINGS: There is mild improvement in mediastinal and hilar adenopathy. For example, a left paratracheal node now measures 13 x 16 mm and previously measured 13 x 21 mm.  A right paratracheal node now measures 11 mm and previously measured 13 x 14 mm.  Subcarinal adenopathy is slightly improved.  There is mild hilar adenopathy bilaterally, which also is slightly improved. 
 Streaky markings are present in the perihilar lung bilaterally, especially in the right lower lobe. There are some fibronodular changes in the lungs, which are similar to the prior study. There is an 8 mm nodule in the right middle lobe, which is unchanged and a 6 mm nodule in the right upper lobe on image 24, which is unchanged. There is no effusion. High-resolution thin section imaging was performed confirming the above findings with some interstitial lung disease in the bases as well as peribronchial thickening in the bases.
IMPRESSION: Hilar and mediastinal adenopathy is mildly improved. Streaky parenchymal changes in the lung bases are similar to the prior study and are compatible with sarcoid. There are some small lung nodules present bilaterally, which are stable and are felt to be due to sarcoid. No new findings.

## 2006-03-16 ENCOUNTER — Ambulatory Visit: Payer: Self-pay | Admitting: Critical Care Medicine

## 2006-05-01 ENCOUNTER — Ambulatory Visit: Payer: Self-pay | Admitting: Critical Care Medicine

## 2006-05-31 ENCOUNTER — Ambulatory Visit: Payer: Self-pay | Admitting: Critical Care Medicine

## 2006-06-20 ENCOUNTER — Ambulatory Visit: Payer: Self-pay | Admitting: Pulmonary Disease

## 2006-06-27 ENCOUNTER — Ambulatory Visit: Payer: Self-pay | Admitting: Critical Care Medicine

## 2006-10-24 ENCOUNTER — Ambulatory Visit: Payer: Self-pay | Admitting: Critical Care Medicine

## 2006-10-24 LAB — CONVERTED CEMR LAB
CO2: 29 meq/L (ref 19–32)
Calcium: 10 mg/dL (ref 8.4–10.5)
Chloride: 101 meq/L (ref 96–112)
Creatinine, Ser: 1 mg/dL (ref 0.4–1.5)
Glucose, Bld: 222 mg/dL — ABNORMAL HIGH (ref 70–99)
Sodium: 137 meq/L (ref 135–145)

## 2006-10-26 ENCOUNTER — Ambulatory Visit: Payer: Self-pay | Admitting: Cardiology

## 2006-10-26 ENCOUNTER — Encounter: Payer: Self-pay | Admitting: Critical Care Medicine

## 2006-10-26 IMAGING — CT CT CHEST W/ CM
2 of 3 series · 15 of 36 positions shown, 18 images · IV contrast (omnipaque)
Comparison: none

CLINICAL DATA: History of sarcoidosis diagnosed [DATE].  Followup exam.  
CHEST CT WITH CONTRAST:
TECHNIQUE: Multidetector CT imaging of the chest was performed following the standard protocol during bolus administration of intravenous contrast.
Contrast:  80 cc Omnipaque 300

[Series 2: chest_rout_xxl 5.0 b31f st · axial · 0.84mm/px · z∈[-336,-66]mm · 12 of 64 slices shown, 15 images]
[im 5/64  mediastinal]
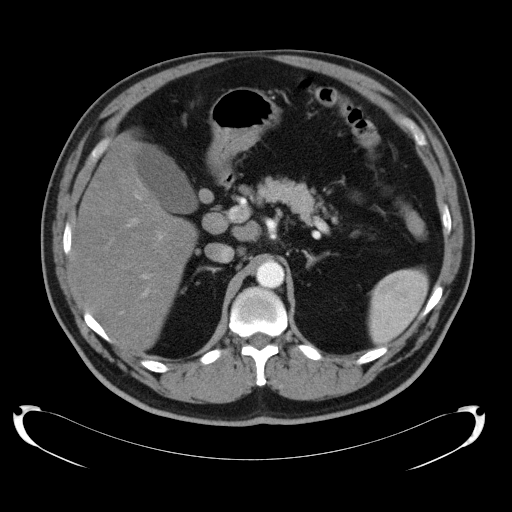
[im 5/64  lung]
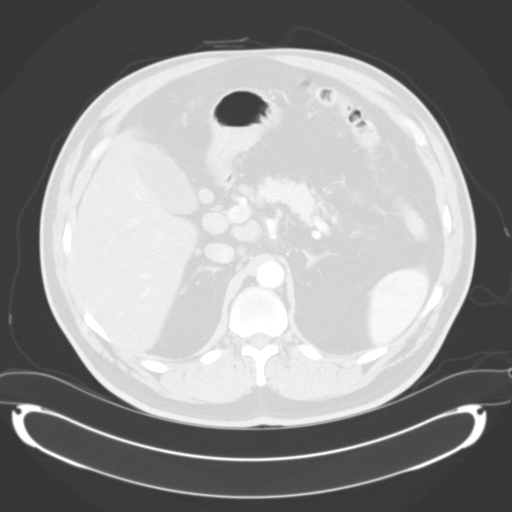
[im 10/64  lung]
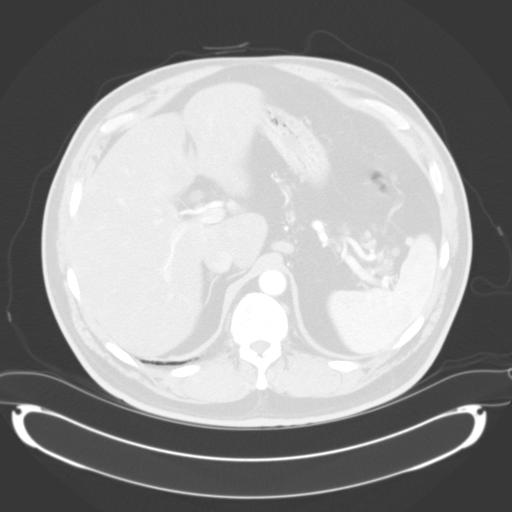
[im 15/64  lung]
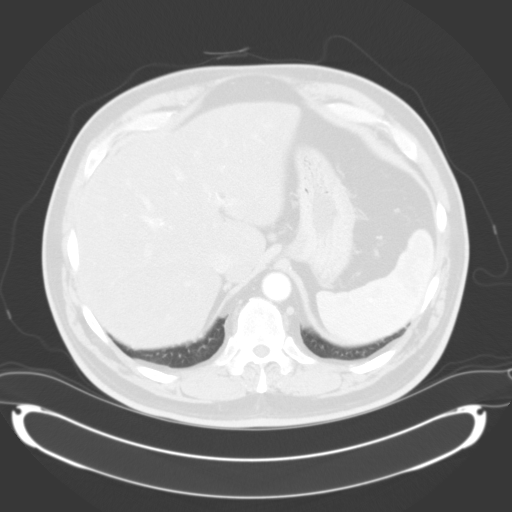
[im 19/64  lung]
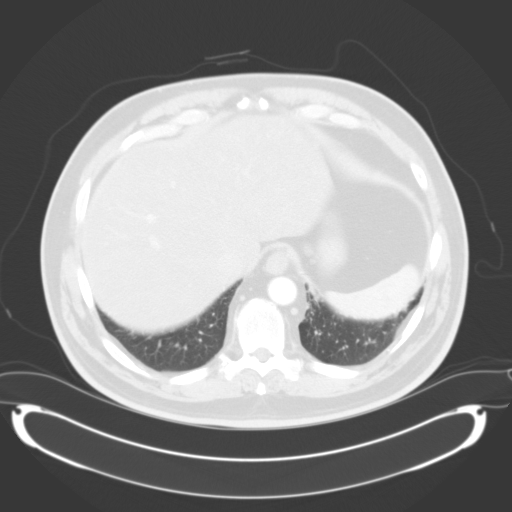
[im 24/64  mediastinal]
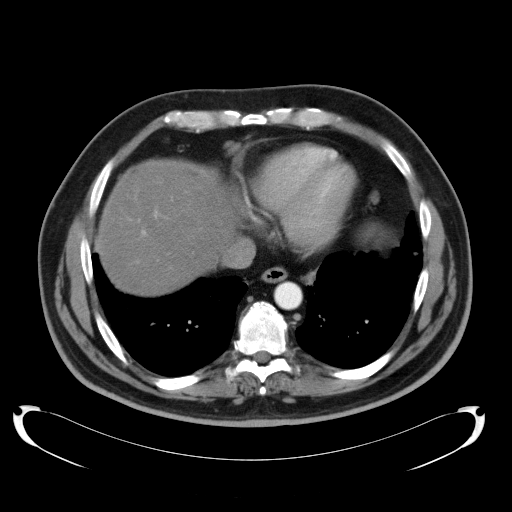
[im 24/64  lung]
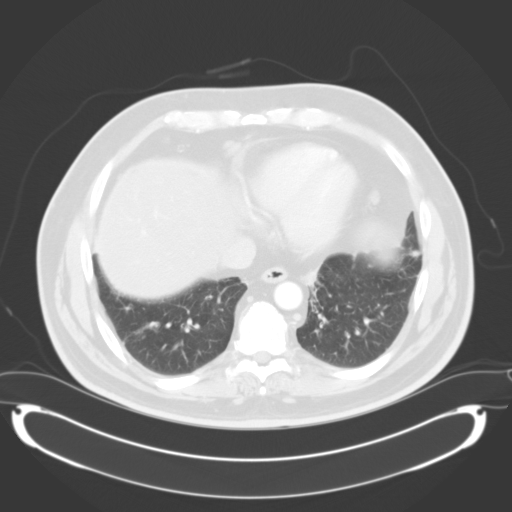
[im 29/64  lung]
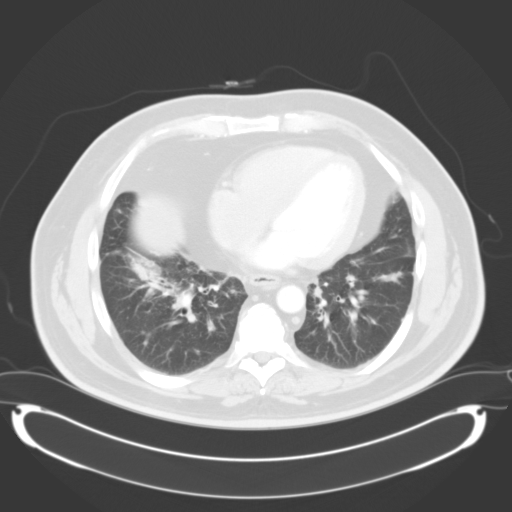
[im 36/64  lung]
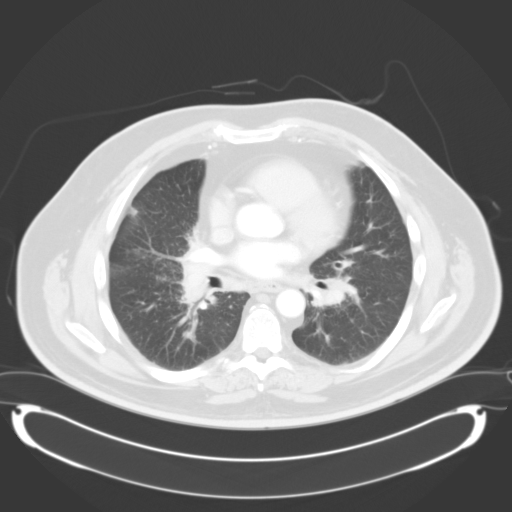
[im 40/64  lung]
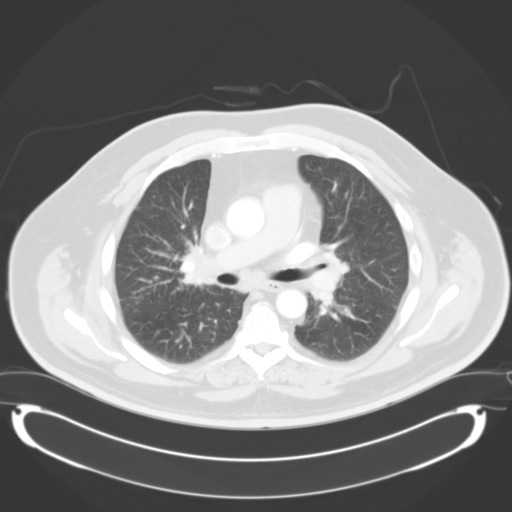
[im 45/64  mediastinal]
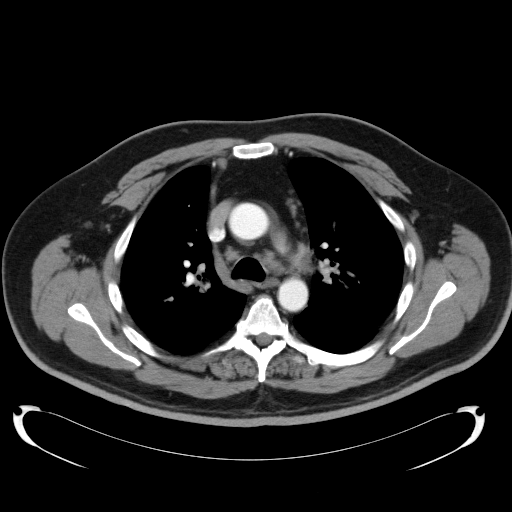
[im 45/64  lung]
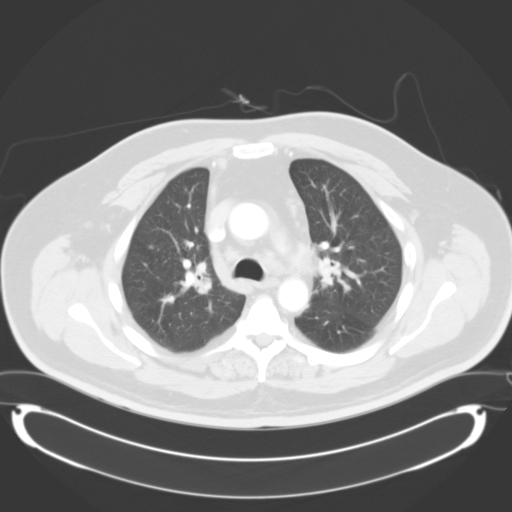
[im 50/64  lung]
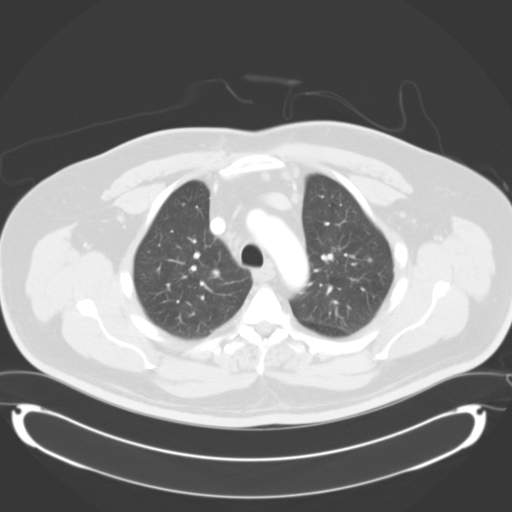
[im 54/64  lung]
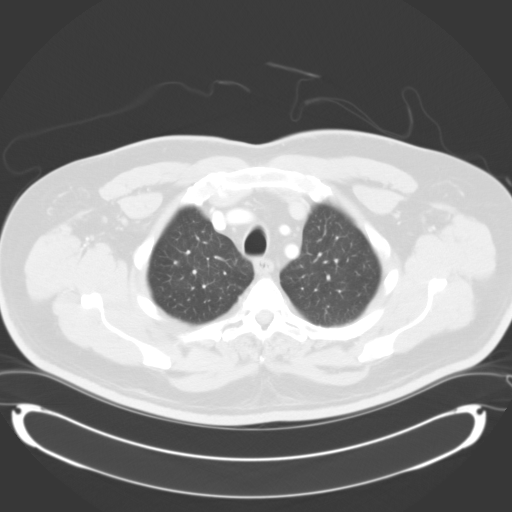
[im 59/64  lung]
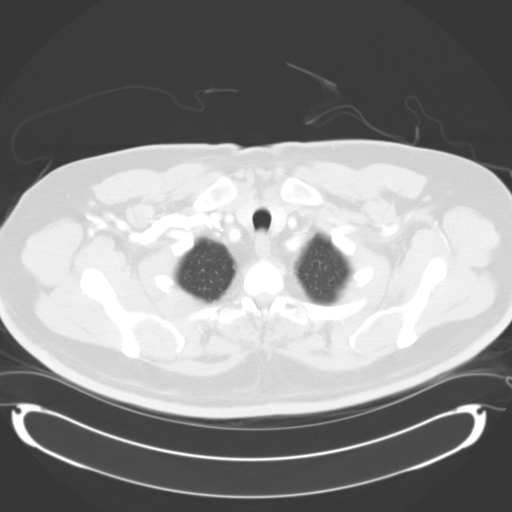

[Series 602: <mpr thick range> · coronal · 0.84mm/px · 3 of 97 slices shown]
[im 20/97  lung]
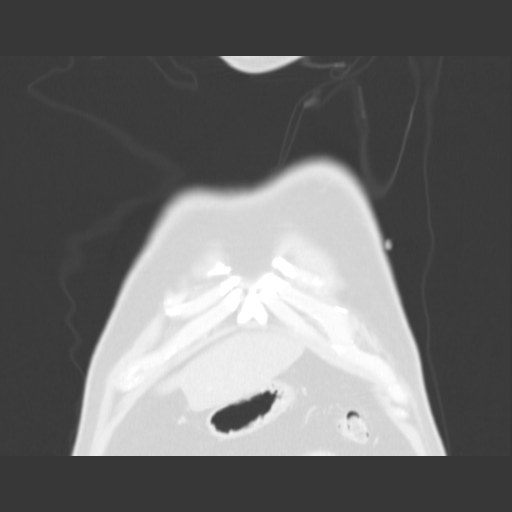
[im 39/97  lung]
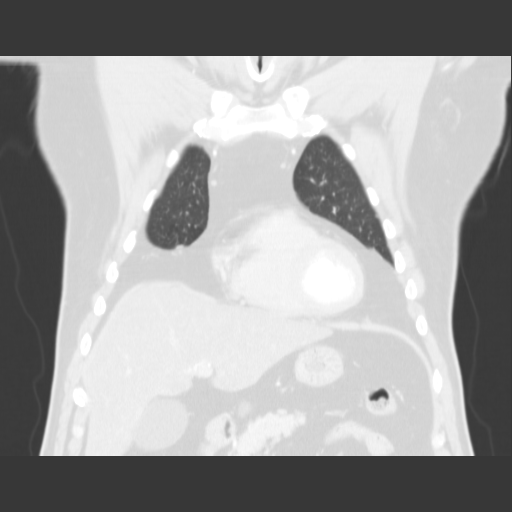
[im 58/97  lung]
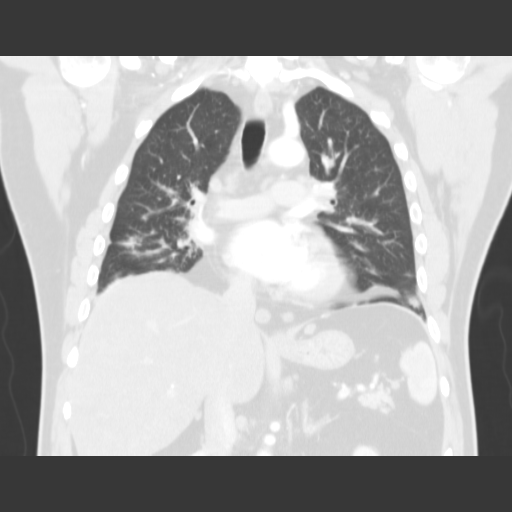

[15 of 36 positions shown; findings below may reference images not displayed]

FINDINGS: Mediastinal adenopathy is again appreciated.  Previously measured left paratracheal lymph node of 13 x 16 mm has not changed in size.  11 mm right lower paratracheal node is unchanged.  There is a cluster of lymph nodes in the subcarinal space that has not changed.  The overall measurement is 2.0 x 2.7 cm.  No appreciable change in other mediastinal nodes.  Bilateral hilar adenopathy is unchanged.  Inferior extension of intersegmental perihilar nodes inferiorly on the right is unchanged.  Other prominent sized nodes in the paraesophageal region and anterior epiphrenic and right cardiophrenic angle regions appear unchanged.  No change in 1.5 x 2.5 paraesophageal node, just right anterolateral to the distal esophagus.  Again appreciated and essentially unchanged is upper abdominal adenopathy.  On image 59 there is an enlarged right celiac node with a short axis diameter of 1.9 cm compared to 1.6 cm on the prior exam.   Prominent periportal nodes.  Stable appearance of node just anterior to the right common hepatic artery measuring 1.6 x 3.2 cm.  Spleen normal in size.  Diffuse fatty infiltration of the liver.  Right middle and lower lobar perihilar streaky soft tissue densities are again appreciated compatible with chronic atelectatic changes.  Innumerable small lung nodules.  9-10 mm right middle lobe nodule is unchanged.  8 mm nodule in the lateral aspect of the right middle lobe is also unchanged.  Again there are findings compatible with small subpleural granuloma in the right upper lobe (see image 28).  There is some nodularity along the pleural fissures.  There is mainly a bronchovascular/perilymphatic distribution of nodules.  Overall the interstitial pattern and nodularity of the lungs has increased slightly.
IMPRESSION: Stable appearance of mediastinal, bihilar, and upper abdominal adenopathy.  Overall slight interval increase in extensive nodularity and interstitial accentuation of the lungs.

## 2006-11-29 ENCOUNTER — Ambulatory Visit: Payer: Self-pay | Admitting: Critical Care Medicine

## 2006-11-29 LAB — CONVERTED CEMR LAB
Albumin: 3.4 g/dL — ABNORMAL LOW (ref 3.5–5.2)
Alkaline Phosphatase: 52 units/L (ref 39–117)
Basophils Relative: 0.4 % (ref 0.0–1.0)
Eosinophils Absolute: 0 10*3/uL (ref 0.0–0.6)
Hemoglobin: 13.5 g/dL (ref 13.0–17.0)
Lymphocytes Relative: 15.7 % (ref 12.0–46.0)
MCHC: 34.4 g/dL (ref 30.0–36.0)
MCV: 90.2 fL (ref 78.0–100.0)
Monocytes Absolute: 0.3 10*3/uL (ref 0.2–0.7)
Monocytes Relative: 4.1 % (ref 3.0–11.0)
Neutro Abs: 6.1 10*3/uL (ref 1.4–7.7)
Phosphorus: 2.8 mg/dL (ref 2.3–4.6)
Platelets: 284 10*3/uL (ref 150–400)
Total Bilirubin: 0.8 mg/dL (ref 0.3–1.2)
Total Protein: 7 g/dL (ref 6.0–8.3)

## 2006-12-20 DIAGNOSIS — E119 Type 2 diabetes mellitus without complications: Secondary | ICD-10-CM | POA: Insufficient documentation

## 2006-12-20 DIAGNOSIS — K219 Gastro-esophageal reflux disease without esophagitis: Secondary | ICD-10-CM | POA: Insufficient documentation

## 2007-01-11 ENCOUNTER — Ambulatory Visit: Payer: Self-pay | Admitting: Critical Care Medicine

## 2007-02-20 ENCOUNTER — Ambulatory Visit: Payer: Self-pay | Admitting: Critical Care Medicine

## 2007-02-20 DIAGNOSIS — D869 Sarcoidosis, unspecified: Secondary | ICD-10-CM | POA: Insufficient documentation

## 2007-03-08 HISTORY — PX: CORONARY STENT PLACEMENT: SHX1402

## 2007-03-09 ENCOUNTER — Ambulatory Visit: Payer: Self-pay | Admitting: Internal Medicine

## 2007-04-05 ENCOUNTER — Ambulatory Visit: Payer: Self-pay | Admitting: Critical Care Medicine

## 2007-04-30 IMAGING — CT CT HEAD W/O CM - CT MAXILLOFACIAL W/O CM
3 series · 17 of 47 positions shown, 20 images · non-contrast
Comparison: NONE

CLINICAL DATA: Recurrent sinusitis. 

CT PARANASAL SINUSES
TECHNIQUE: Axial, sagittal, and coronal images were obtained.

[Series 3: 3x3 · axial · 0.35mm/px · z∈[+231,+345]mm · 11 of 44 slices shown, 14 images]
[im 3/44  brain]
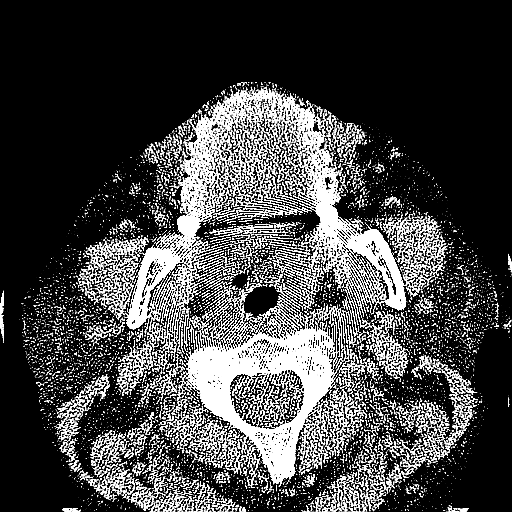
[im 3/44  bone]
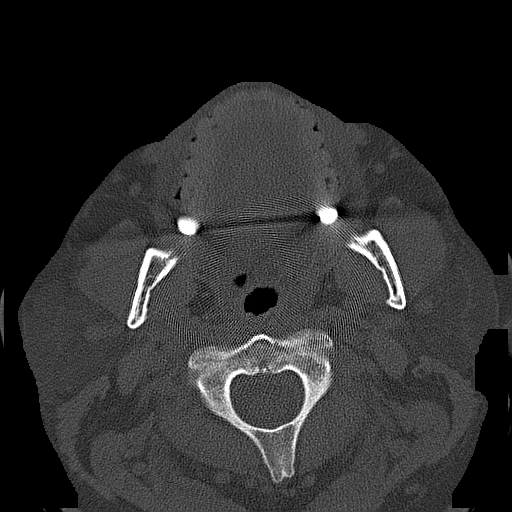
[im 6/44  bone]
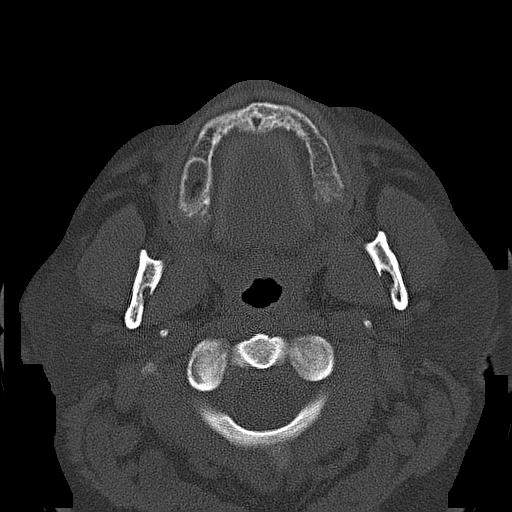
[im 11/44  bone]
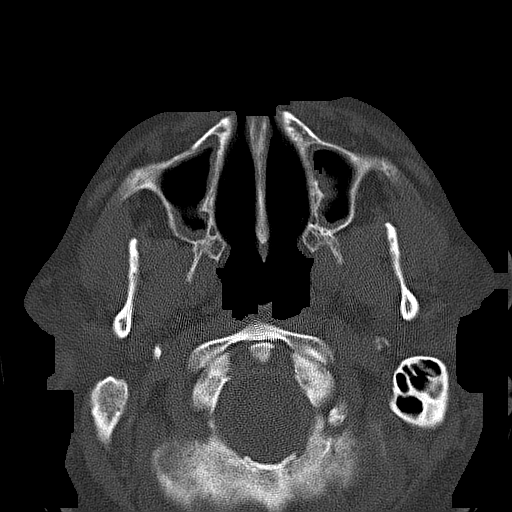
[im 14/44  bone]
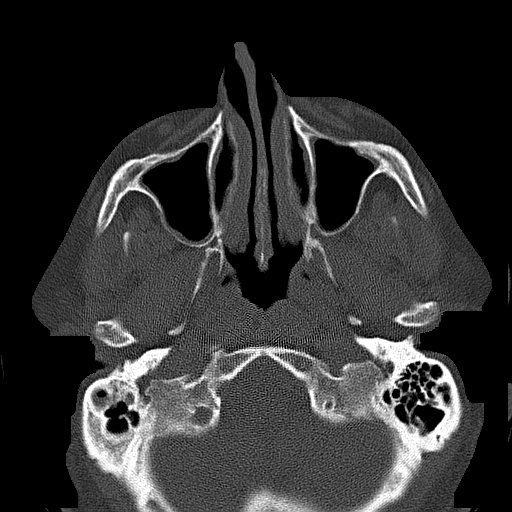
[im 18/44  brain]
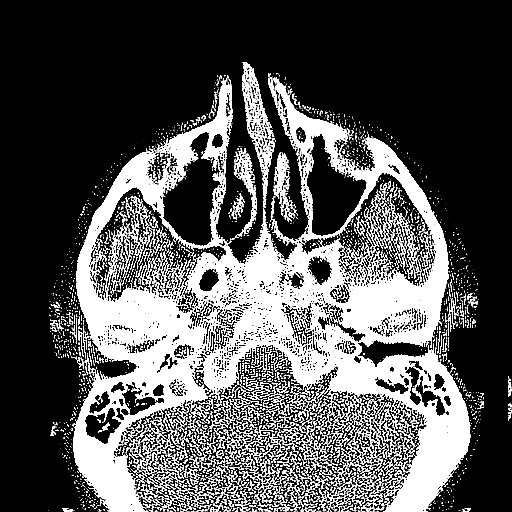
[im 18/44  bone]
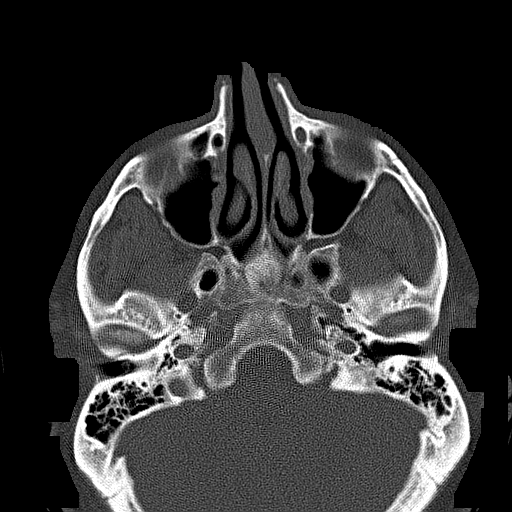
[im 23/44  bone]
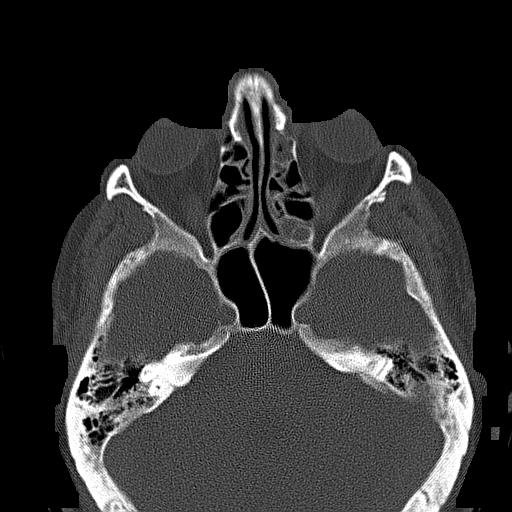
[im 26/44  bone]
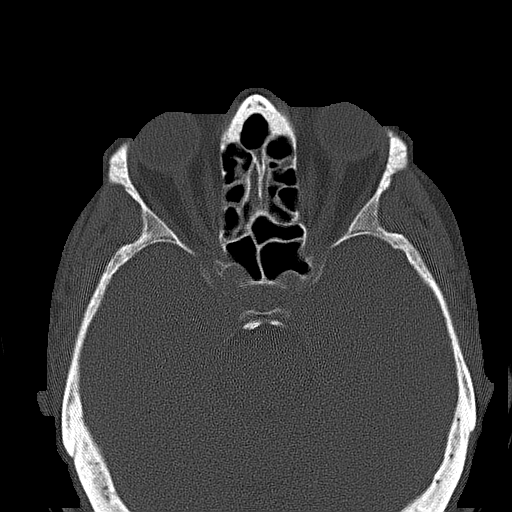
[im 30/44  bone]
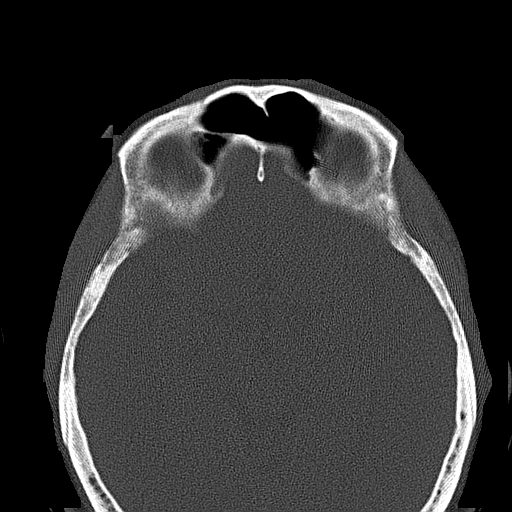
[im 33/44  brain]
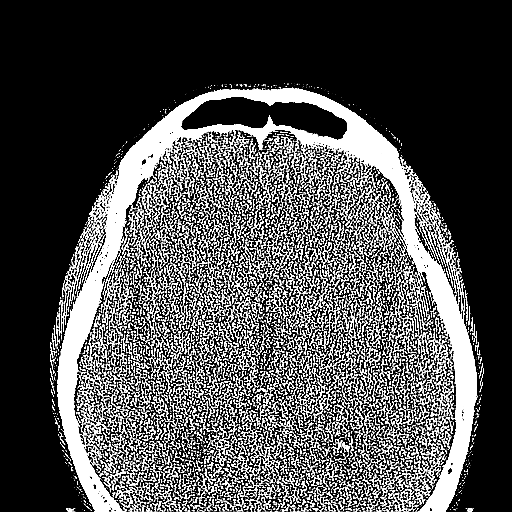
[im 33/44  bone]
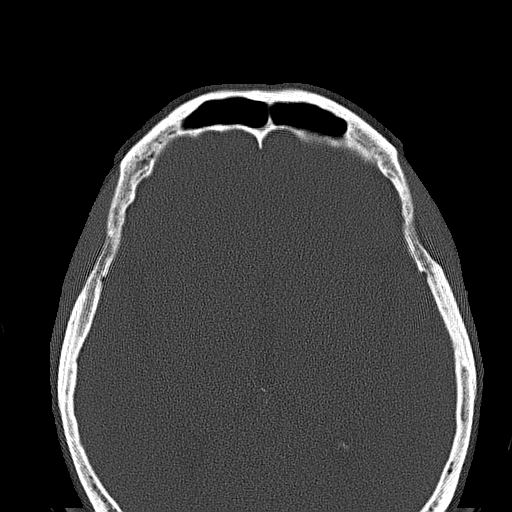
[im 38/44  bone]
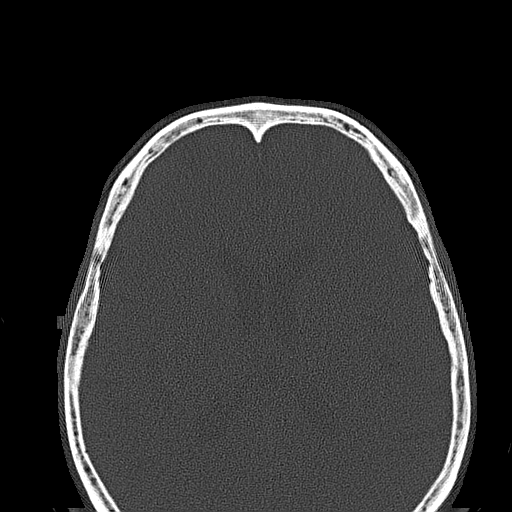
[im 41/44  bone]
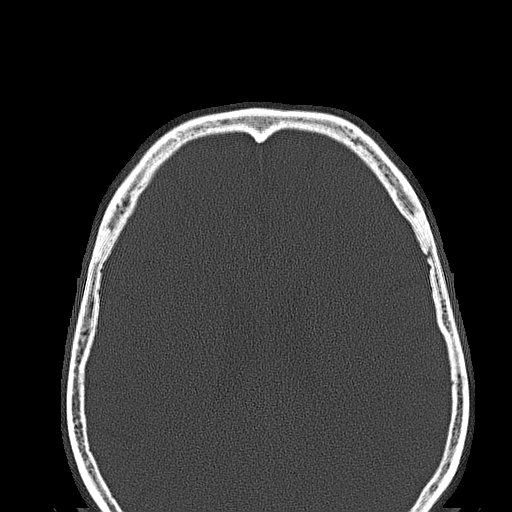

[coronals · coronal · 0.35mm/px · 3 of 29 slices shown]
[im 10/29  bone]
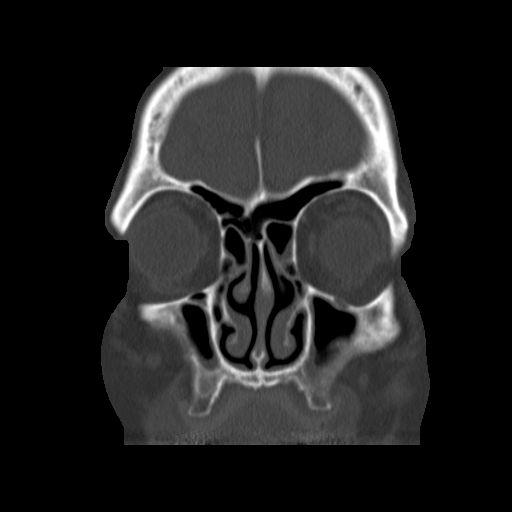
[im 13/29  bone]
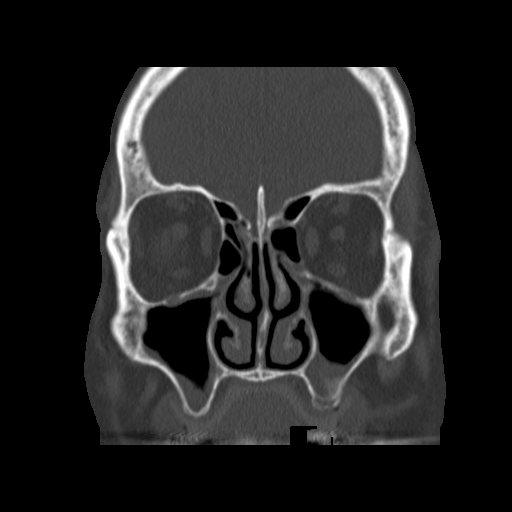
[im 16/29  bone]
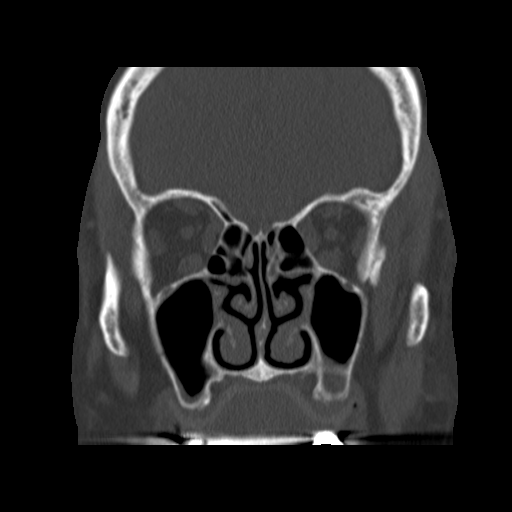

[sagittals · sagittal · 0.35mm/px · 3 of 31 slices shown]
[im 11/31  bone]
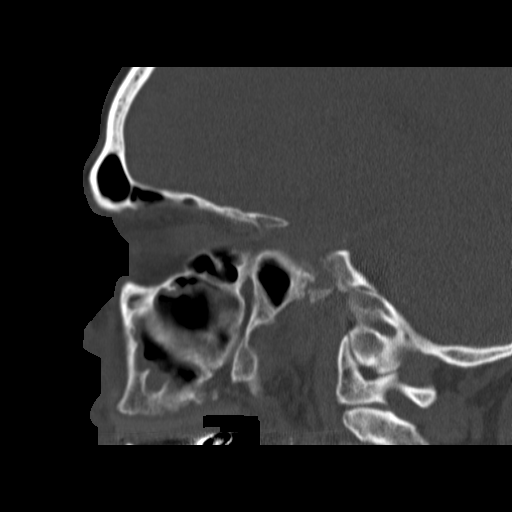
[im 16/31  bone]
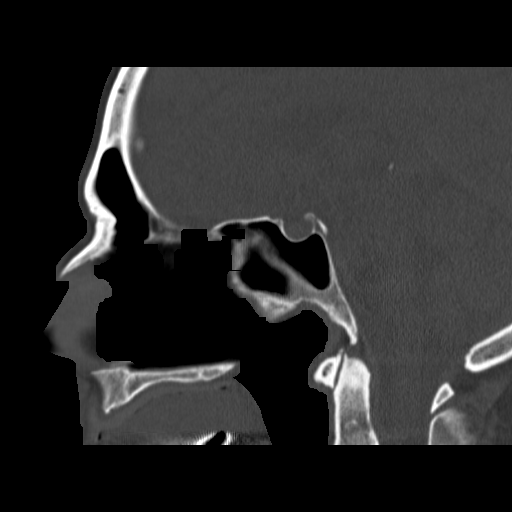
[im 21/31  bone]
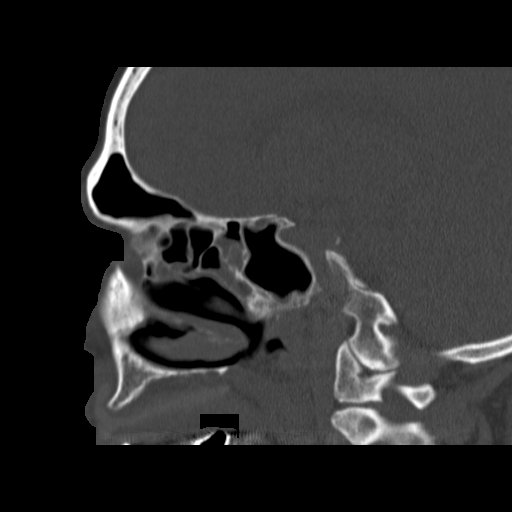

[17 of 47 positions shown; findings below may reference images not displayed]

FINDINGS: Mild mucosal thickening in the maxillary, sphenoid, and 
ethmoid sinuses.  Frontal sinus is clear. Orbits and mastoids are 
unremarkable.  No evidence of nasopharyngeal mass. No fractures, 
lytic or blastic lesions.  No evidence of nasal passage occlusion 
or polyposis.
IMPRESSION: Minimal-mild chronic changes in the paranasal 
electronically reviewed on [DATE] Dict Date: [DATE]  Tran 
Date:  [DATE] DAS  JLM

## 2007-06-29 ENCOUNTER — Ambulatory Visit: Payer: Self-pay | Admitting: Critical Care Medicine

## 2007-08-06 ENCOUNTER — Ambulatory Visit: Payer: Self-pay | Admitting: Critical Care Medicine

## 2007-11-15 ENCOUNTER — Inpatient Hospital Stay (HOSPITAL_COMMUNITY): Admission: RE | Admit: 2007-11-15 | Discharge: 2007-11-16 | Payer: Self-pay | Admitting: Cardiology

## 2007-11-15 IMAGING — CT CT ABDOMEN W/O CM
2 of 4 series · 16 of 46 positions shown, 18 images · non-contrast
Comparison: [DATE]

CT ABDOMEN

CLINICAL DATA: Cardiac catheterization.  Short of breath.
Abdominal pain.  Assess for retroperitoneal hemorrhage.

CT ABDOMEN AND PELVIS WITHOUT CONTRAST
TECHNIQUE: Multidetector CT imaging of the abdomen and pelvis was
performed following the standard
protocol without intravenous contrast.

[Series 2: abd/pelv w/o 5.0 b31f st · axial · non-contrast · 0.89mm/px · z∈[-536,-31]mm · 13 of 111 slices shown, 15 images]
[im 5/111  soft-tissue]
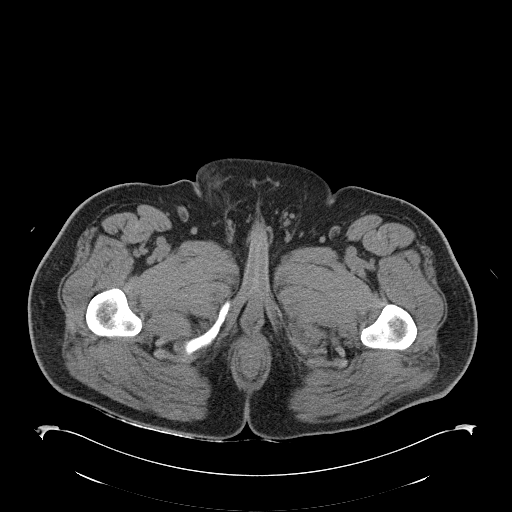
[im 5/111  bone]
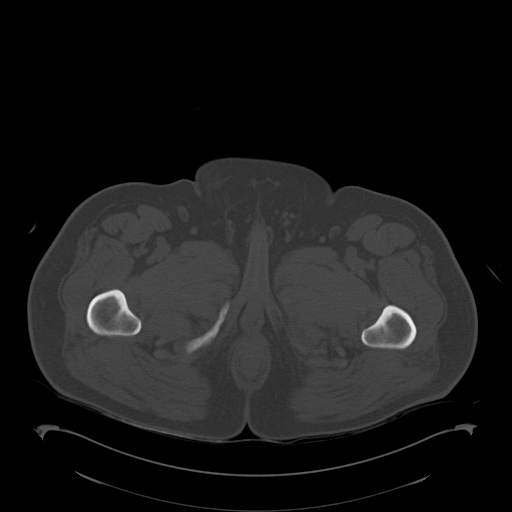
[im 14/111  soft-tissue]
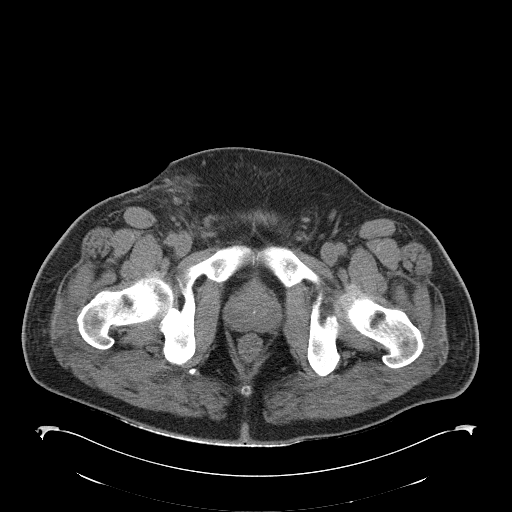
[im 23/111  soft-tissue]
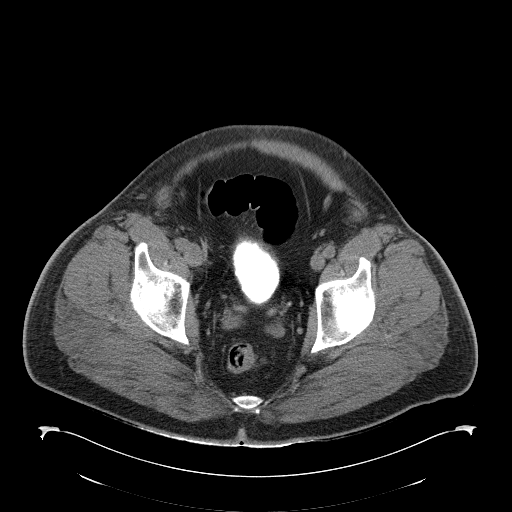
[im 33/111  soft-tissue]
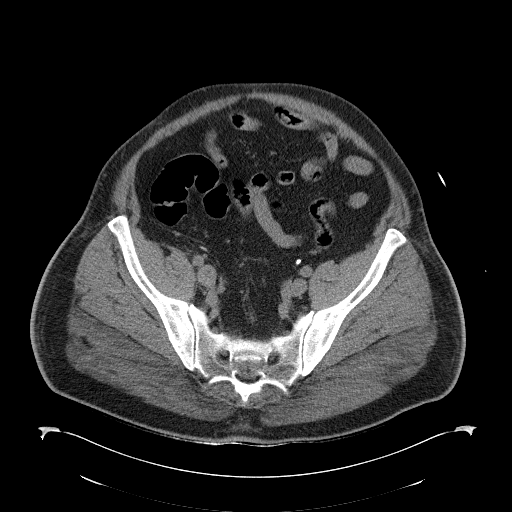
[im 37/111  soft-tissue]
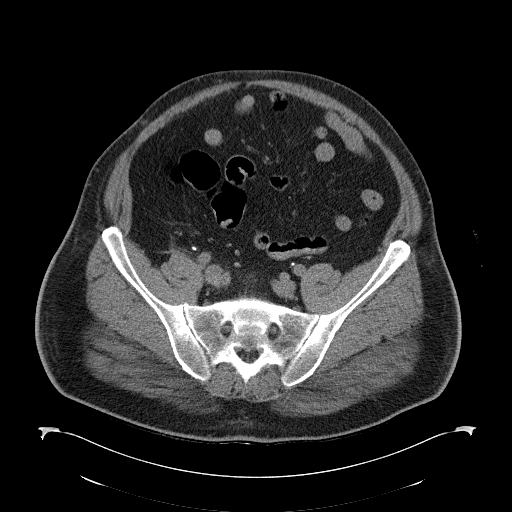
[im 46/111  soft-tissue]
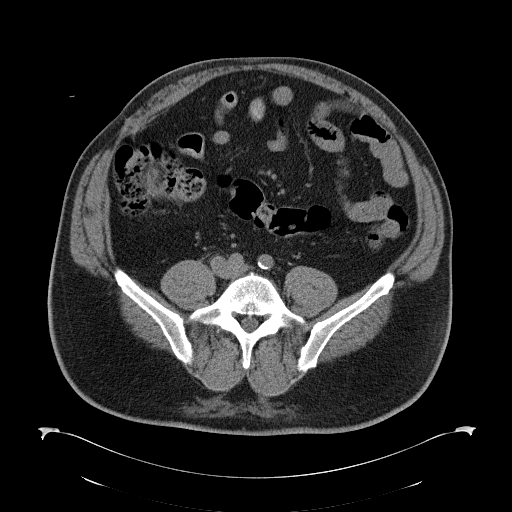
[im 56/111  soft-tissue]
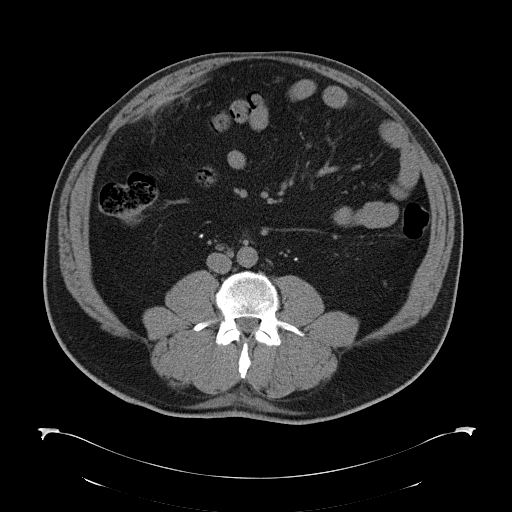
[im 65/111  soft-tissue]
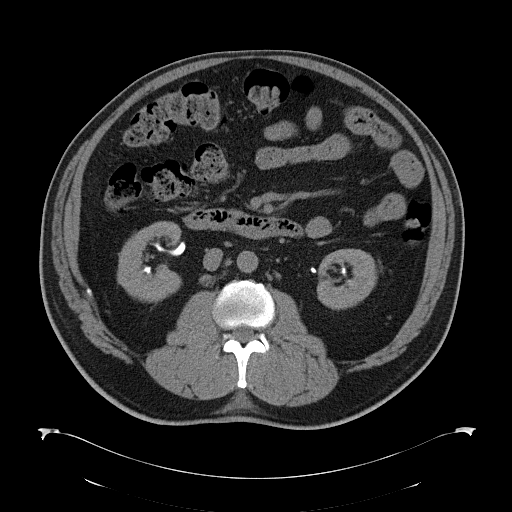
[im 74/111  soft-tissue]
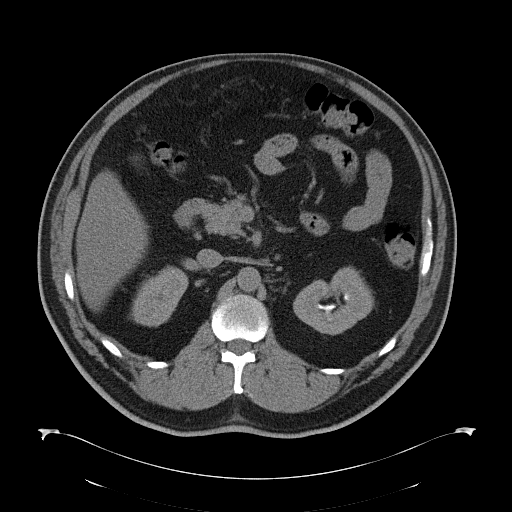
[im 74/111  bone]
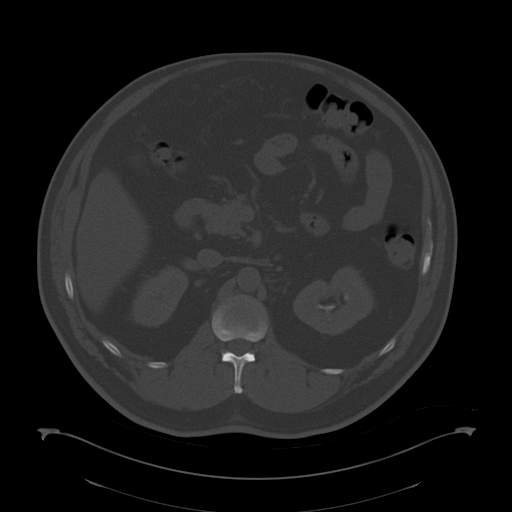
[im 78/111  soft-tissue]
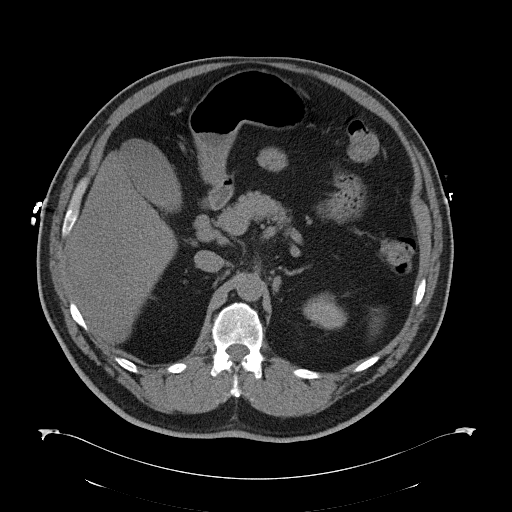
[im 88/111  soft-tissue]
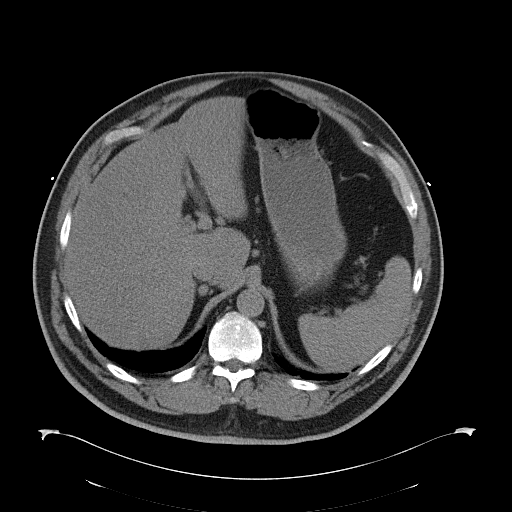
[im 97/111  soft-tissue]
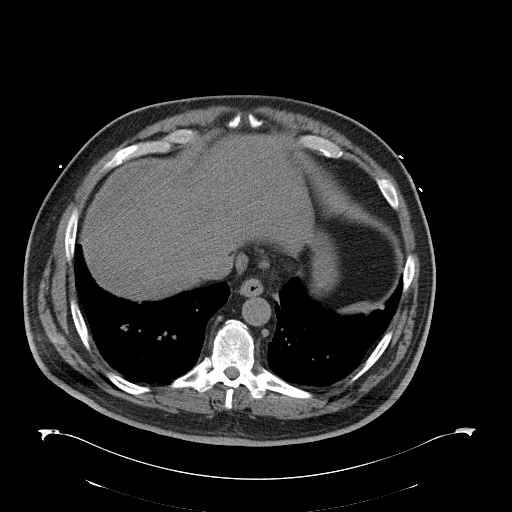
[im 106/111  soft-tissue]
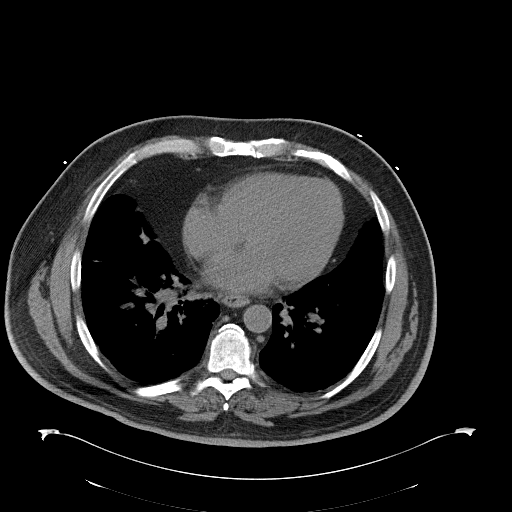

[Series 5: abd/pelv w/o 2.0 spo thins · coronal · non-contrast · 1.08mm/px · 3 of 145 slices shown]
[im 49/145  soft-tissue]
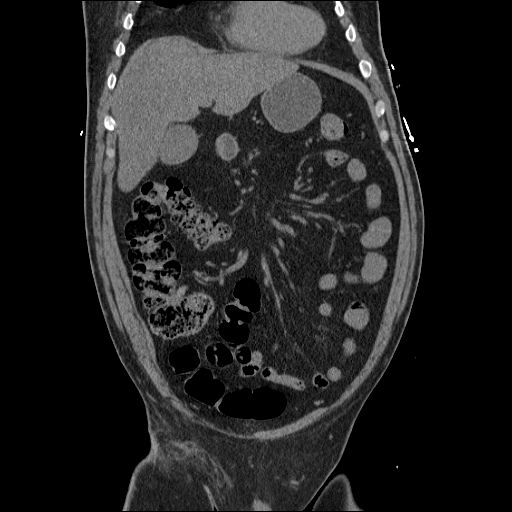
[im 65/145  soft-tissue]
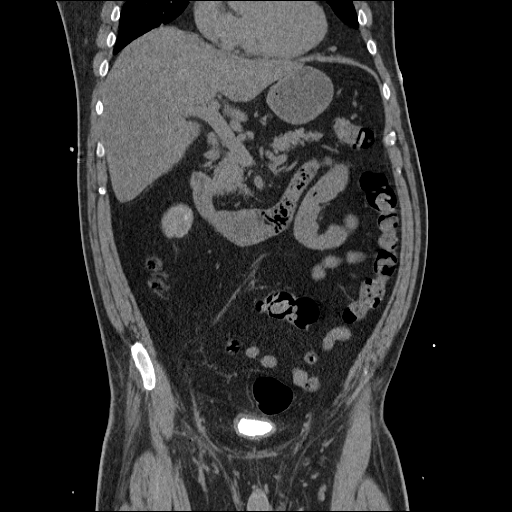
[im 81/145  soft-tissue]
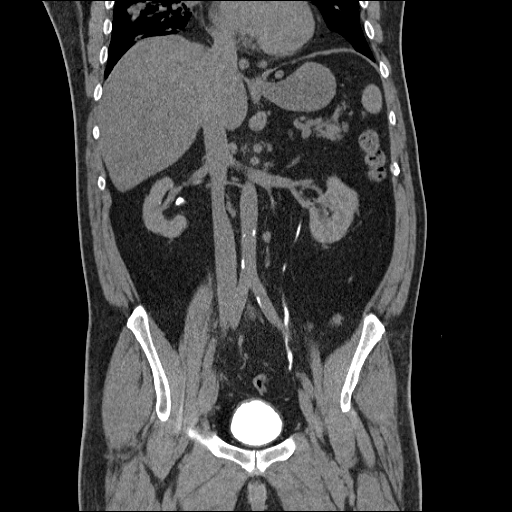

[16 of 46 positions shown; findings below may reference images not displayed]

FINDINGS: Lung bases show extensive hilar adenopathy with abnormal
interstitial markings, right worse than left.  The patient has a
history of sarcoid.  The disease appears progressive when compared
to a chest CT of [DATE].  One could not exclude neoplastic
disease given this appearance, but clinical diagnosis is that of
sarcoid.

The liver, gallbladder, spleen, pancreas and adrenal glands appear
unremarkable.  The kidneys contain contrast subsequent to the
previous catheterization procedure.  There is a 1 cm cyst laterally
in the right kidney.  No obstruction.  No aortic aneurysm.  The IVC
is unremarkable.  No retroperitoneal hemorrhage is present in the
abdominal portion of the scan.  There is an appearance of stranding
in the peripheral mesentery and/or the internal oblique
musculature.  If the patient is anticoagulated, it is possible that
there is a spontaneous abdominal wall hemorrhages in this region.
The process does not appear to relate primarily to bowel.  No acute
bowel pathology is evident.  No free fluid or air.  There are some
lymph nodes in the upper abdomen probably related to the patient's
sarcoid.  These were present on previous years exams.
IMPRESSION: No retroperitoneal hemorrhage.  Possible right-sided abdominal wall
and or mesenteric hemorrhage.  This is not a large volume
abnormality but could certainly be symptomatic.]

CT PELVIS
FINDINGS: Mild edematous changes are noted in the right groin
region presumably following catheterization from this approach.
There is a very small amount of stranding along the iliac vessels,
but no measurable hematoma.  There is no free fluid.  No bowel
pathology.  No mass or adenopathy.  Contrast fills the bladder.
The prostate gland is prominent.
IMPRESSION: Small amount of stranding in the right groin and an along the right
iliac vessels, very minimal and one would presume subclinical.

## 2007-11-20 ENCOUNTER — Ambulatory Visit: Payer: Self-pay | Admitting: Critical Care Medicine

## 2007-11-20 DIAGNOSIS — I2510 Atherosclerotic heart disease of native coronary artery without angina pectoris: Secondary | ICD-10-CM

## 2007-11-20 DIAGNOSIS — I25118 Atherosclerotic heart disease of native coronary artery with other forms of angina pectoris: Secondary | ICD-10-CM | POA: Insufficient documentation

## 2007-11-26 ENCOUNTER — Inpatient Hospital Stay (HOSPITAL_COMMUNITY): Admission: EM | Admit: 2007-11-26 | Discharge: 2007-11-27 | Payer: Self-pay | Admitting: Emergency Medicine

## 2007-11-26 IMAGING — CT CT ABDOMEN W/O CM
2 of 4 series · 16 of 46 positions shown, 18 images · non-contrast
Comparison: [DATE]

CT ABDOMEN

CLINICAL DATA: Worsening abdominal and pelvic pain, history of
abdominal/abdominal wall hemorrhage, past history sarcoidosis

CT ABDOMEN AND PELVIS WITHOUT CONTRAST
TECHNIQUE: Multidetector CT imaging of the abdomen and pelvis was
performed following the standard
protocol without intravenous contrast. Sagittal and coronal MPR
images reconstructed from axial data set.

[Series 2: abd/pelv w/o 5.0 b31f st · axial · non-contrast · 0.92mm/px · z∈[-538,-38]mm · 13 of 110 slices shown, 15 images]
[im 5/110  soft-tissue]
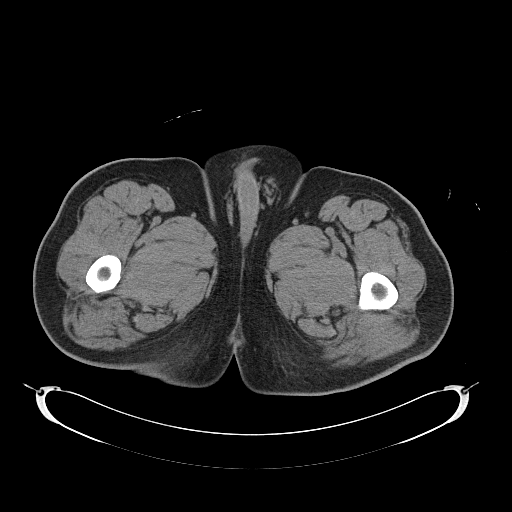
[im 5/110  bone]
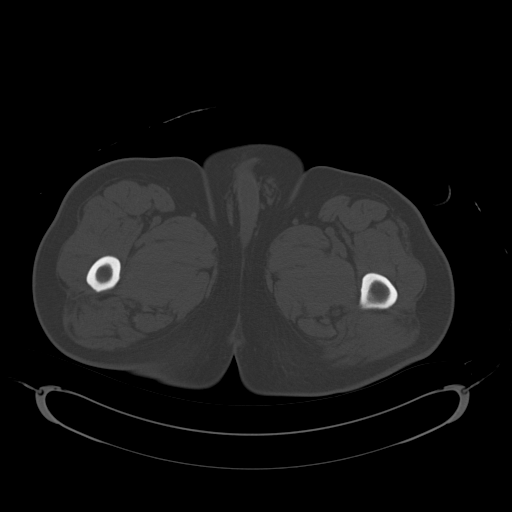
[im 14/110  soft-tissue]
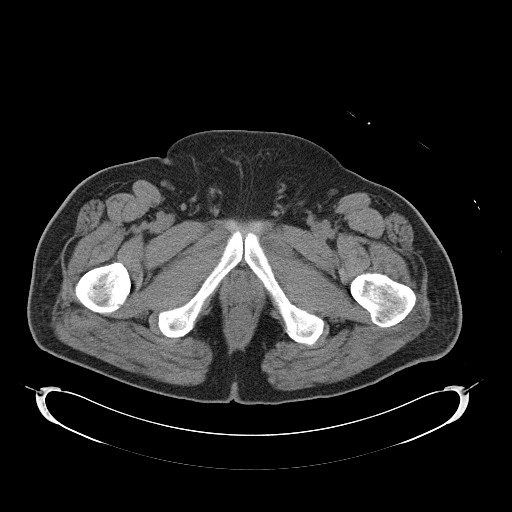
[im 23/110  soft-tissue]
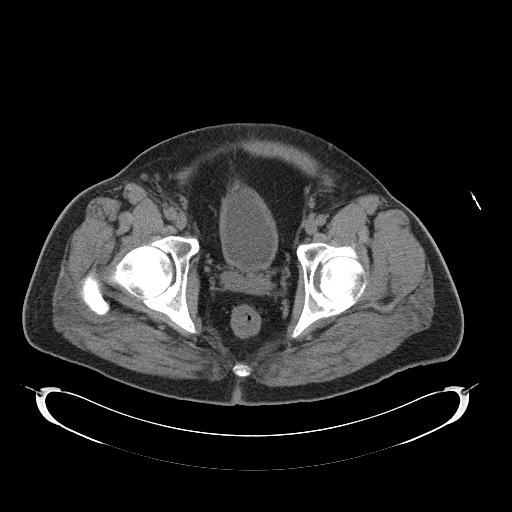
[im 32/110  soft-tissue]
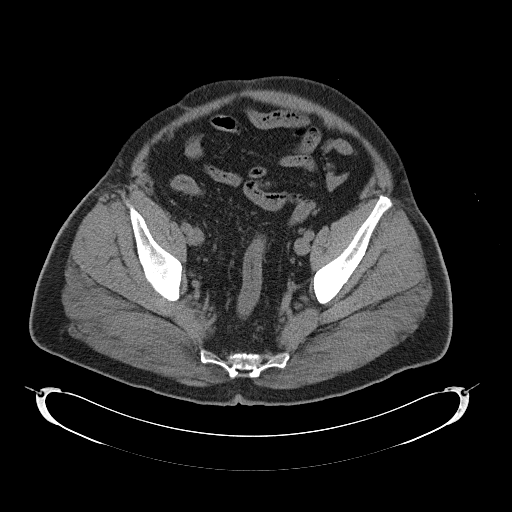
[im 37/110  soft-tissue]
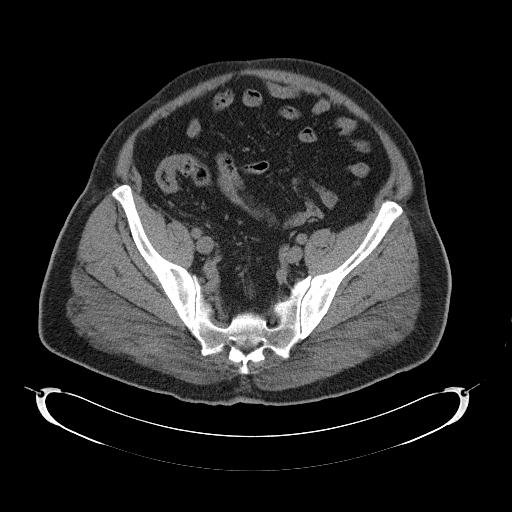
[im 46/110  soft-tissue]
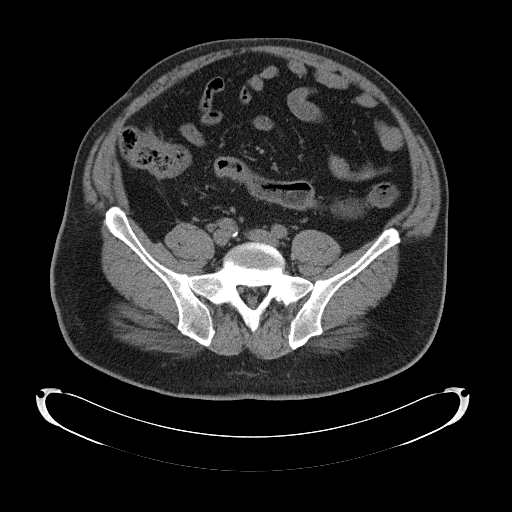
[im 55/110  soft-tissue]
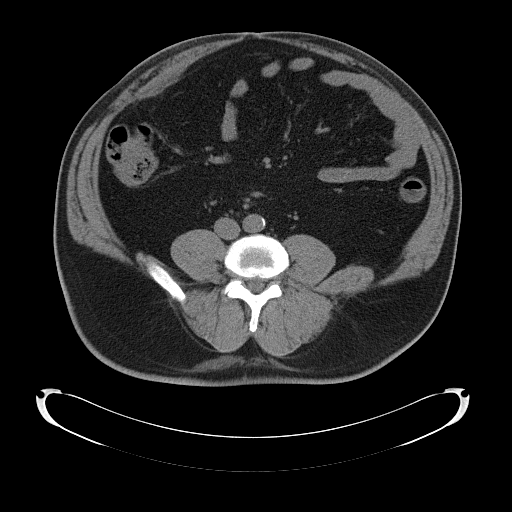
[im 64/110  soft-tissue]
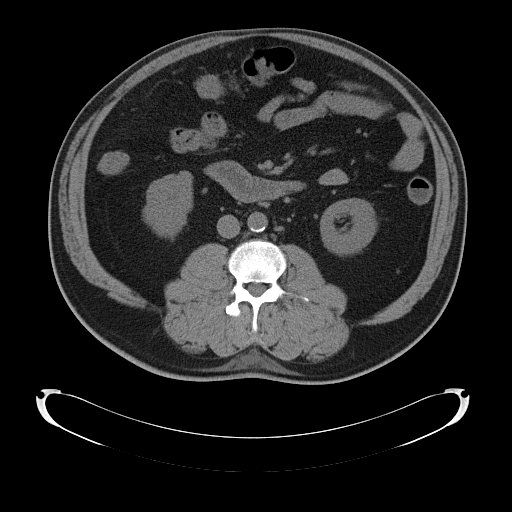
[im 73/110  soft-tissue]
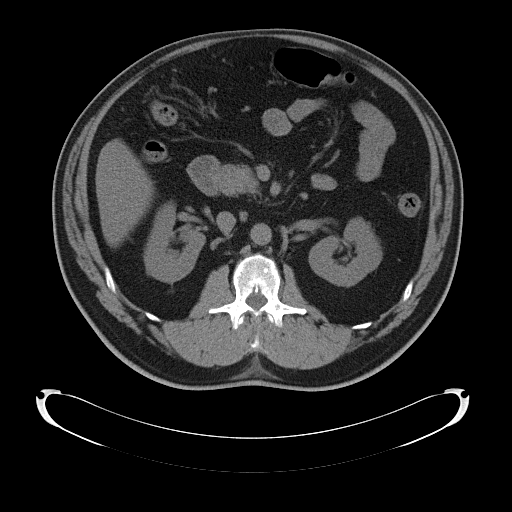
[im 73/110  bone]
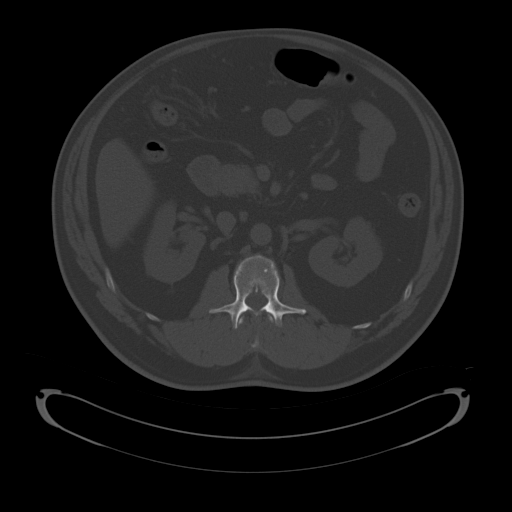
[im 78/110  soft-tissue]
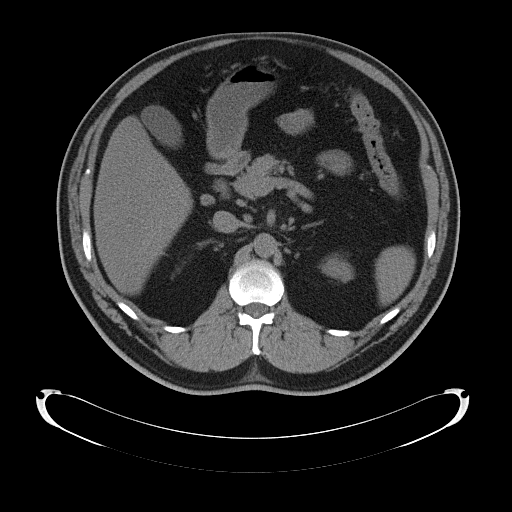
[im 87/110  soft-tissue]
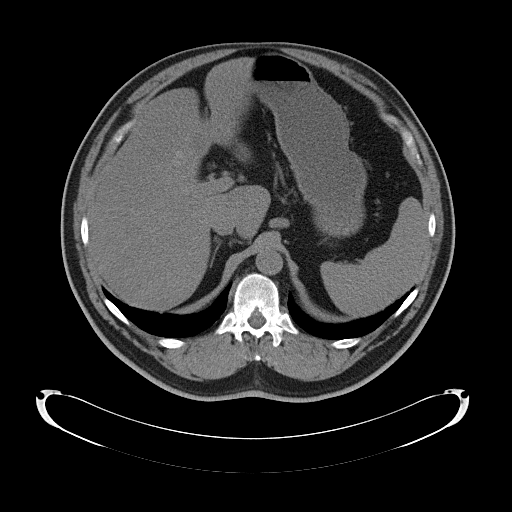
[im 96/110  soft-tissue]
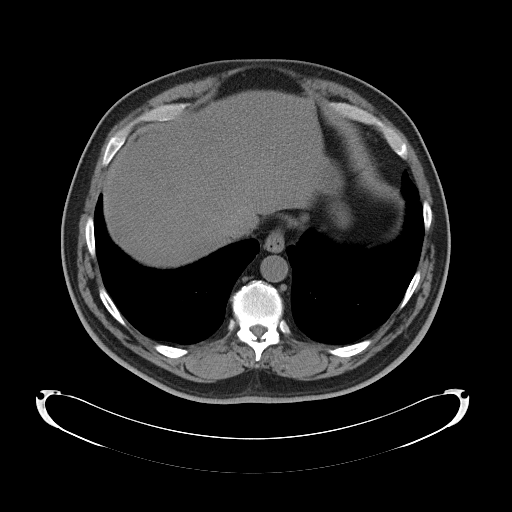
[im 105/110  soft-tissue]
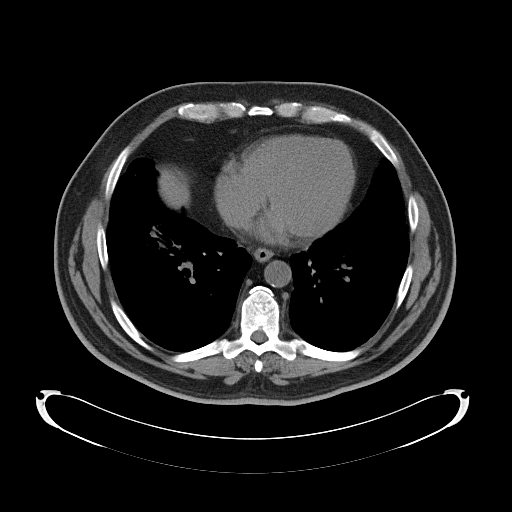

[Series 602: cor · coronal · 1.07mm/px · 3 of 108 slices shown]
[im 36/108  soft-tissue]
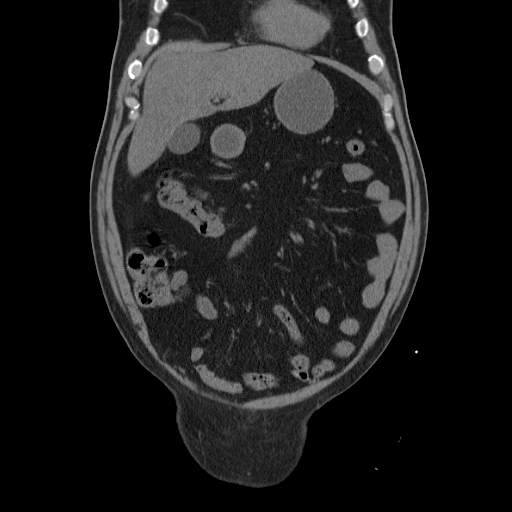
[im 48/108  soft-tissue]
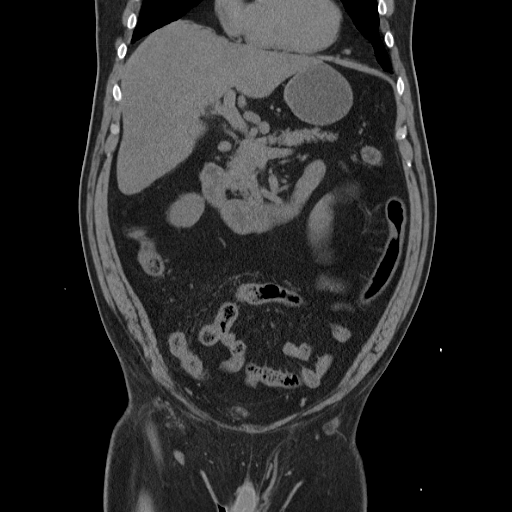
[im 60/108  soft-tissue]
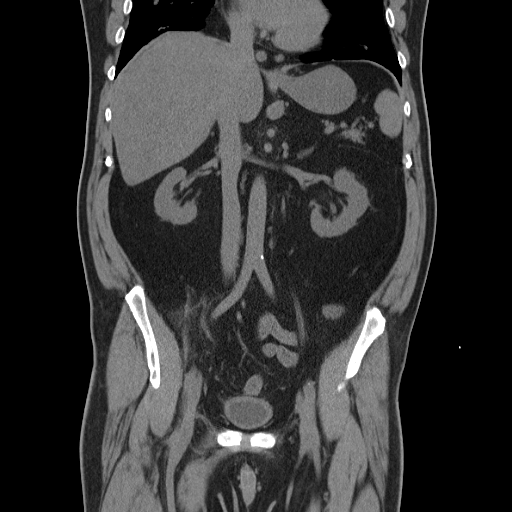

[16 of 46 positions shown; findings below may reference images not displayed]

FINDINGS: Scarring identified at lung bases, greatest at right lower lobe,
compatible with history of sarcoidosis.
Areas of nodular increased density seen at right lower lobe and
right middle lobe bases, up to 13 mm in size, overall appearance
not significantly changed since prior chest CT of [DATE].
Upper abdominal adenopathy noted, as well as right cardiophrenic
angle nodes, appearance similar to prior study, also potentially
related to sarcoidosis.
Index node portocaval 2.1 x 1.5 cm image 31 stable.
Tiny right renal cyst, 1.7 x 1.4 cm image 40.
Remaining solid organs and bowel loops in upper abdomen normal.
Mild stranding of properitoneal fat adjacent to enter abdominal
wall fascia, images 51-57, decreased since previous study.
Pattern is compatible with improving hemorrhage.
No upper abdominal mass, free fluid, or developing inflammatory
process.
No additional hemorrhage or abdominal wall abnormalities seen.
IMPRESSION: Improving hemorrhage in right anterolateral abdomen adjacent to
internal oblique fascia.
Changes in lower chest and upper abdomen compatible with history of
sarcoidosis, stable.
No new upper abdominal abnormalities.

CT PELVIS
FINDINGS: Improved infiltration in right inguinal region at site of prior
catheterization.
Minimal bladder wall thickening.
Large and small bowel loops in pelvis grossly normal.
No pelvic mass, adenopathy, free fluid, or inflammatory process
definitely visualized.
Appendix not identified.
Bones unremarkable.
Slight hyperemia of sigmoid mesocolon is seen in the right pelvis,
images 77-80 but this is a subtle and nonspecific finding, with no
gross sigmoid abnormality identified at the nondistended sigmoid
colon.
IMPRESSION: Improving infiltration at site of catheterization access at right
inguinal region.
Question minimal hyperemia of the sigmoid mesocolon, nonspecific
finding of uncertain clinical significance.
No other intrapelvic abnormalities.

## 2007-11-26 IMAGING — CT CT ANGIO CHEST
2 of 9 series · 17 of 36 positions shown · IV contrast (APPLIED)
Comparison: CT chest to [DATE].

CLINICAL DATA: Cold sweats and weakness.  Shortness of breath.
Recent cardiac Q angioplasty.

CT ANGIOGRAPHY CHEST
TECHNIQUE: Multidetector CT imaging of the chest using the
standard protocol during bolus administration of intravenous
contrast. Multiplanar reconstructed images obtained and reviewed to
evaluate the vascular anatomy.
Contrast: 100 ml Omnipaque 300.

[Series 8: pulm embolism 1.0 b25f thins · axial · 0.72mm/px · z∈[-342,-94]mm · 16 of 283 slices shown]
[im 17/283  lung]
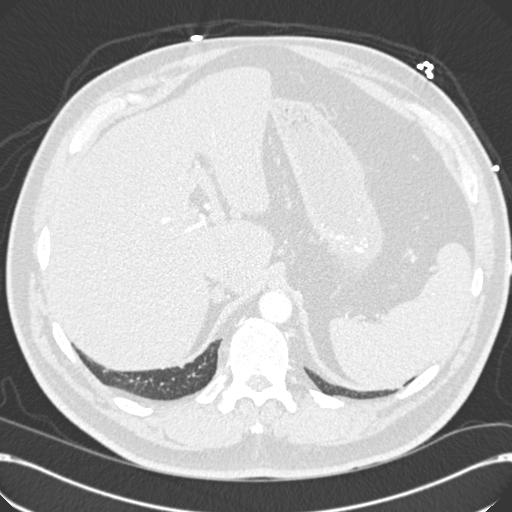
[im 34/283  mediastinal]
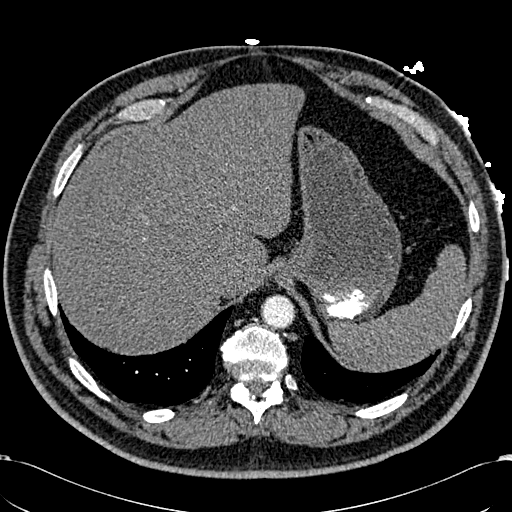
[im 50/283  lung]
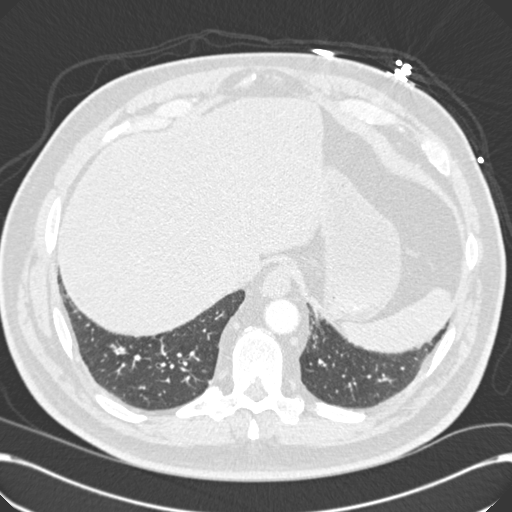
[im 67/283  mediastinal]
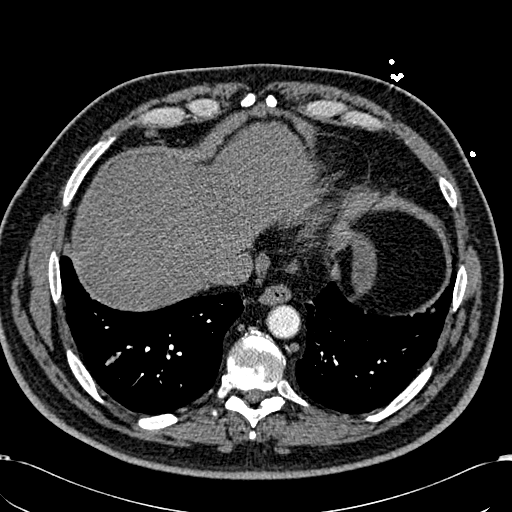
[im 83/283  lung]
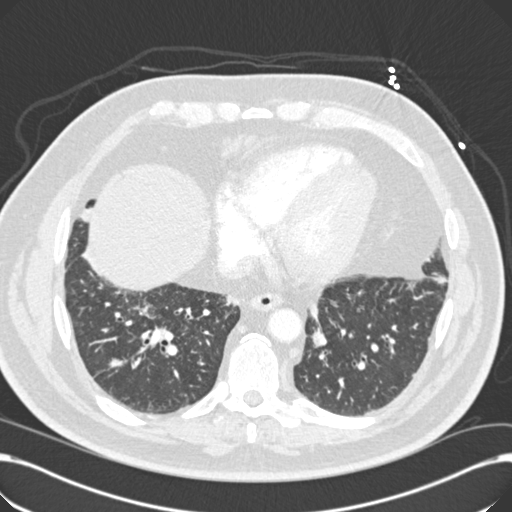
[im 100/283  mediastinal]
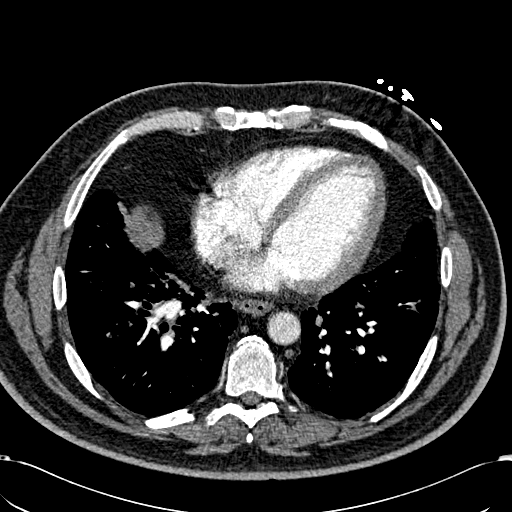
[im 117/283  lung]
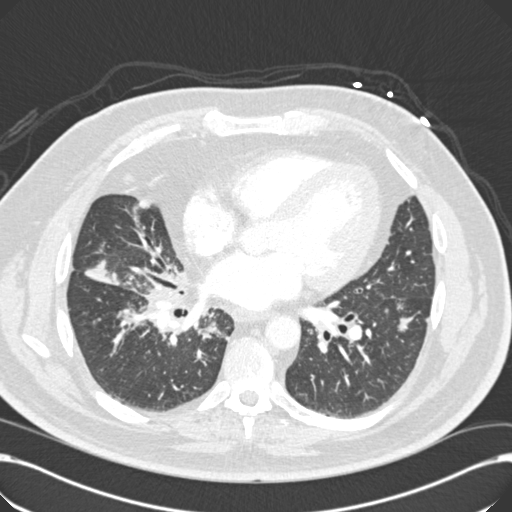
[im 133/283  mediastinal]
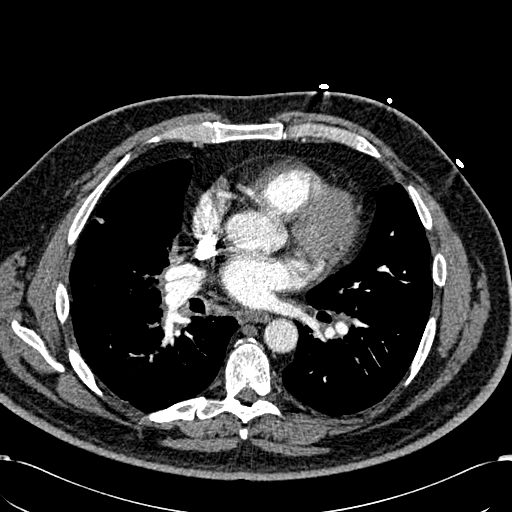
[im 150/283  lung]
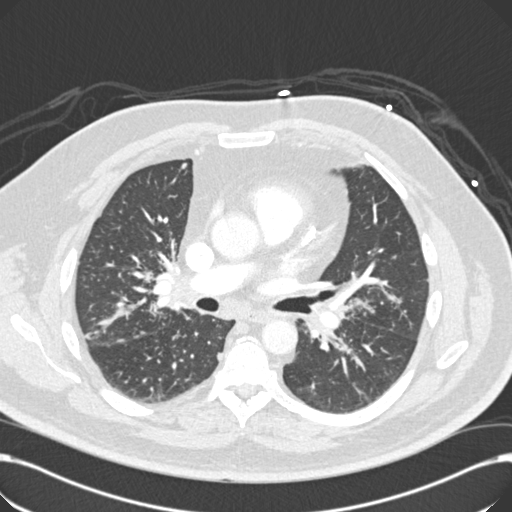
[im 166/283  mediastinal]
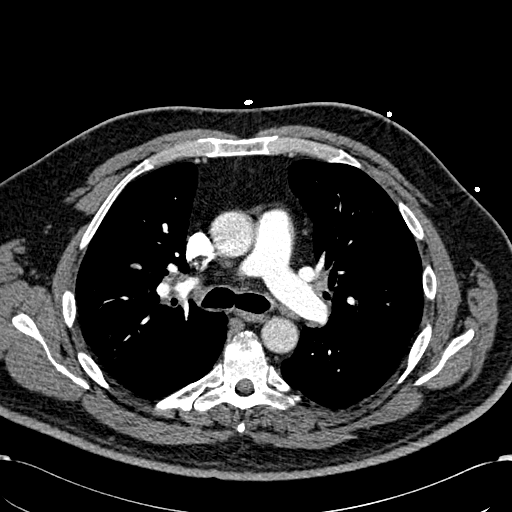
[im 183/283  lung]
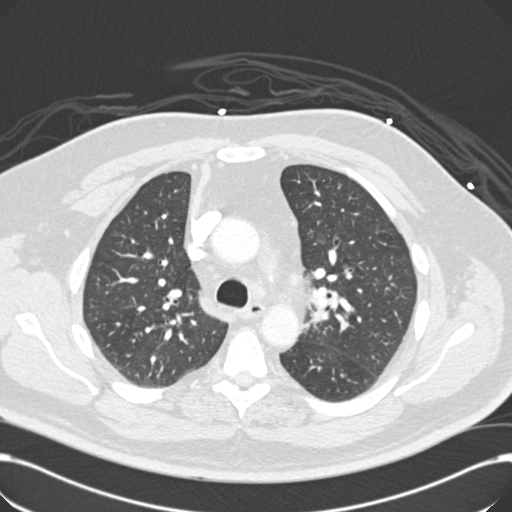
[im 200/283  mediastinal]
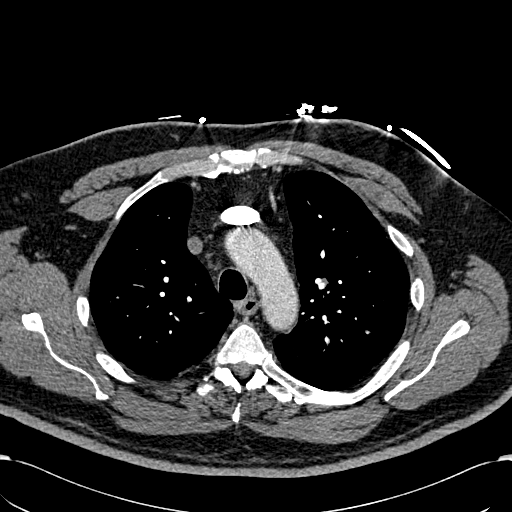
[im 216/283  lung]
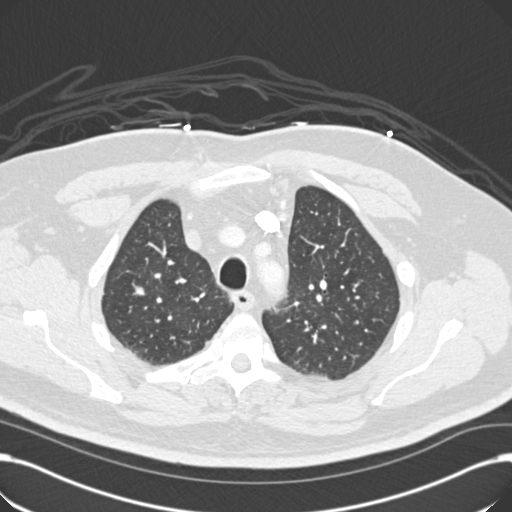
[im 233/283  mediastinal]
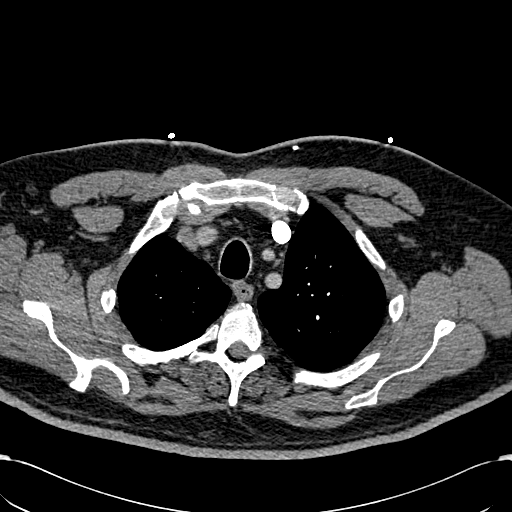
[im 249/283  lung]
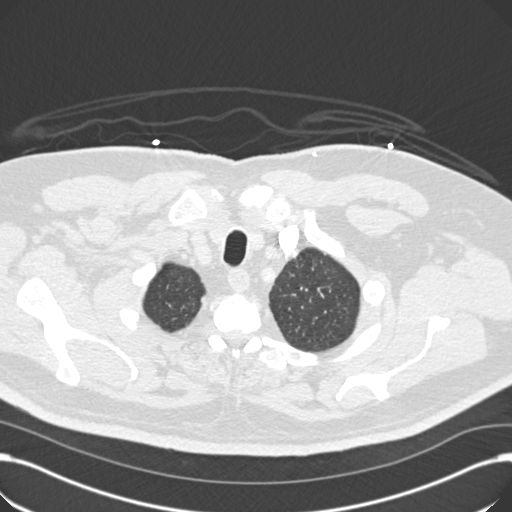
[im 266/283  mediastinal]
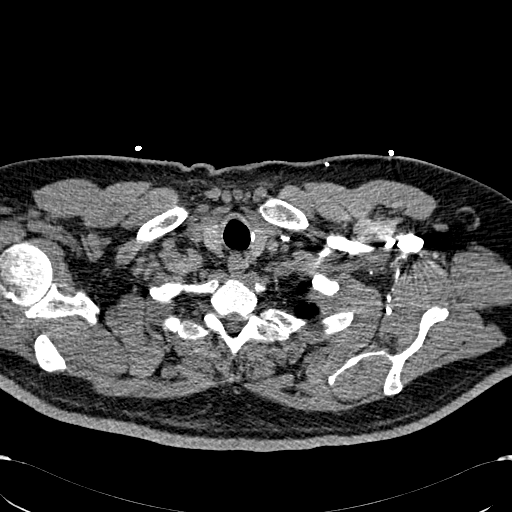

[Series 602: coronal chest · coronal · 0.72mm/px · 1 of 76 slices shown]
[im 38/76  mediastinal]
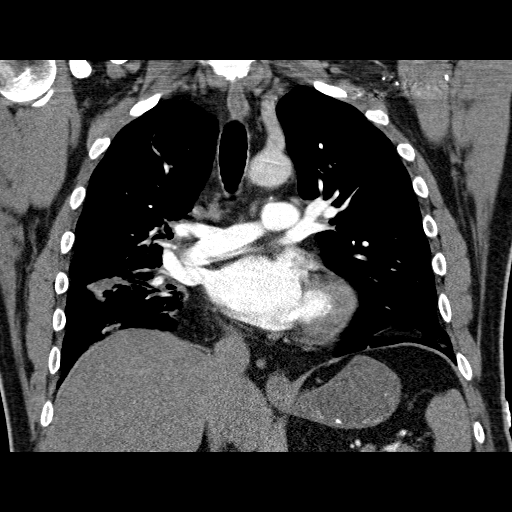

[17 of 36 positions shown; findings below may reference images not displayed]

FINDINGS: There is excellent opacification of the pulmonary
arterial system.  No focal filling defects are present to suggest
emboli.  Mediastinal windows otherwise demonstrates stable
appearance of paratracheal lymph nodes.  A right paratracheal node
just above the carina measures seven mm.  The second slightly
smaller than the prior exam.  AP window nodes are unchanged.
Bilateral hilar nodal tissue is stable.  The chronic airspace
disease of the right middle lobe is slightly progressed.
Superimposed infection is not excluded.  A micronodular pattern
within the right middle lobe and lower lobe is otherwise unchanged.
Left lung nodules are stable.  Upper abdominal lymph nodes are
stable as well.  The previously measured porta hepatis node is not
imaged today.  Bone windows reveal no focal lytic or blastic
lesions.
IMPRESSION: 1.  No evidence of pulmonary emboli.
2.  Stable nodal changes of sarcoidosis.
3.  Diffuse lung changes, particularly in the right middle lobe.
There is further collapse and consolidation on today's study.  It
is unclear if this represents progression of sarcoid or
superimposed infection.

## 2007-12-14 ENCOUNTER — Emergency Department (HOSPITAL_COMMUNITY): Admission: EM | Admit: 2007-12-14 | Discharge: 2007-12-14 | Payer: Self-pay | Admitting: Emergency Medicine

## 2007-12-15 ENCOUNTER — Ambulatory Visit: Payer: Self-pay | Admitting: Surgery

## 2007-12-15 ENCOUNTER — Encounter (INDEPENDENT_AMBULATORY_CARE_PROVIDER_SITE_OTHER): Payer: Self-pay | Admitting: Emergency Medicine

## 2007-12-15 ENCOUNTER — Ambulatory Visit (HOSPITAL_COMMUNITY): Admission: RE | Admit: 2007-12-15 | Discharge: 2007-12-15 | Payer: Self-pay | Admitting: Emergency Medicine

## 2008-01-04 ENCOUNTER — Ambulatory Visit: Payer: Self-pay | Admitting: Critical Care Medicine

## 2008-02-21 ENCOUNTER — Ambulatory Visit: Payer: Self-pay | Admitting: Critical Care Medicine

## 2008-02-21 DIAGNOSIS — Z87898 Personal history of other specified conditions: Secondary | ICD-10-CM

## 2008-02-21 DIAGNOSIS — Z8639 Personal history of other endocrine, nutritional and metabolic disease: Secondary | ICD-10-CM | POA: Insufficient documentation

## 2008-02-22 LAB — CONVERTED CEMR LAB: Cortisol, Plasma: 5.4 ug/dL

## 2008-03-07 DIAGNOSIS — I219 Acute myocardial infarction, unspecified: Secondary | ICD-10-CM

## 2008-03-07 HISTORY — DX: Acute myocardial infarction, unspecified: I21.9

## 2008-03-28 ENCOUNTER — Ambulatory Visit: Payer: Self-pay | Admitting: Critical Care Medicine

## 2008-05-22 ENCOUNTER — Ambulatory Visit: Payer: Self-pay | Admitting: Critical Care Medicine

## 2008-08-07 ENCOUNTER — Ambulatory Visit: Payer: Self-pay | Admitting: Critical Care Medicine

## 2008-08-08 LAB — CONVERTED CEMR LAB: Cortisol, Plasma: 3.8 ug/dL

## 2008-08-13 ENCOUNTER — Encounter: Payer: Self-pay | Admitting: Critical Care Medicine

## 2008-08-14 IMAGING — CR DG CHEST 2V
2 series · 2 of 2 positions shown · non-contrast
Comparison: None

CLINICAL DATA: Fever, shortness of breath, cough, chest pain

CHEST - 2 VIEW

[w chest pa]
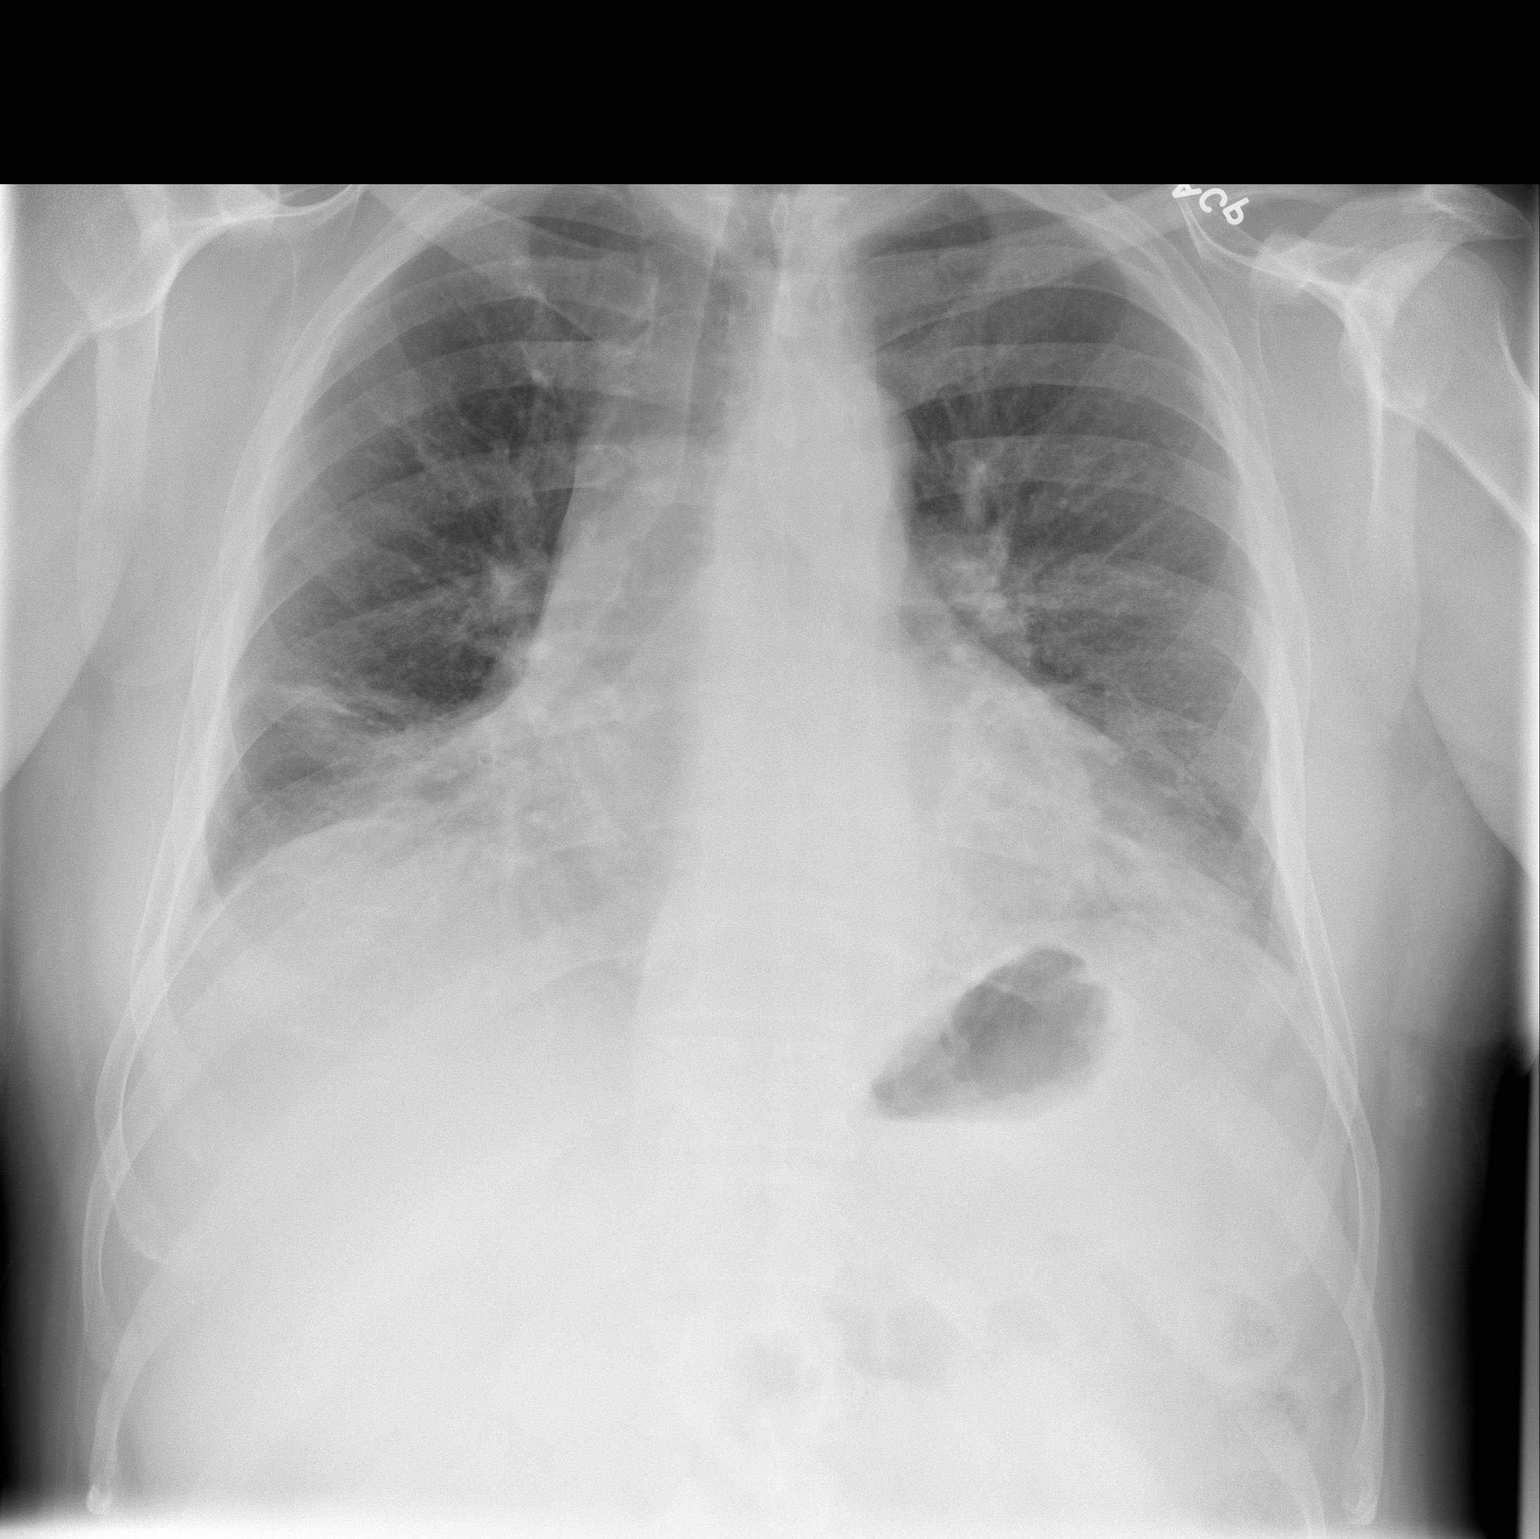

[w chest lat]
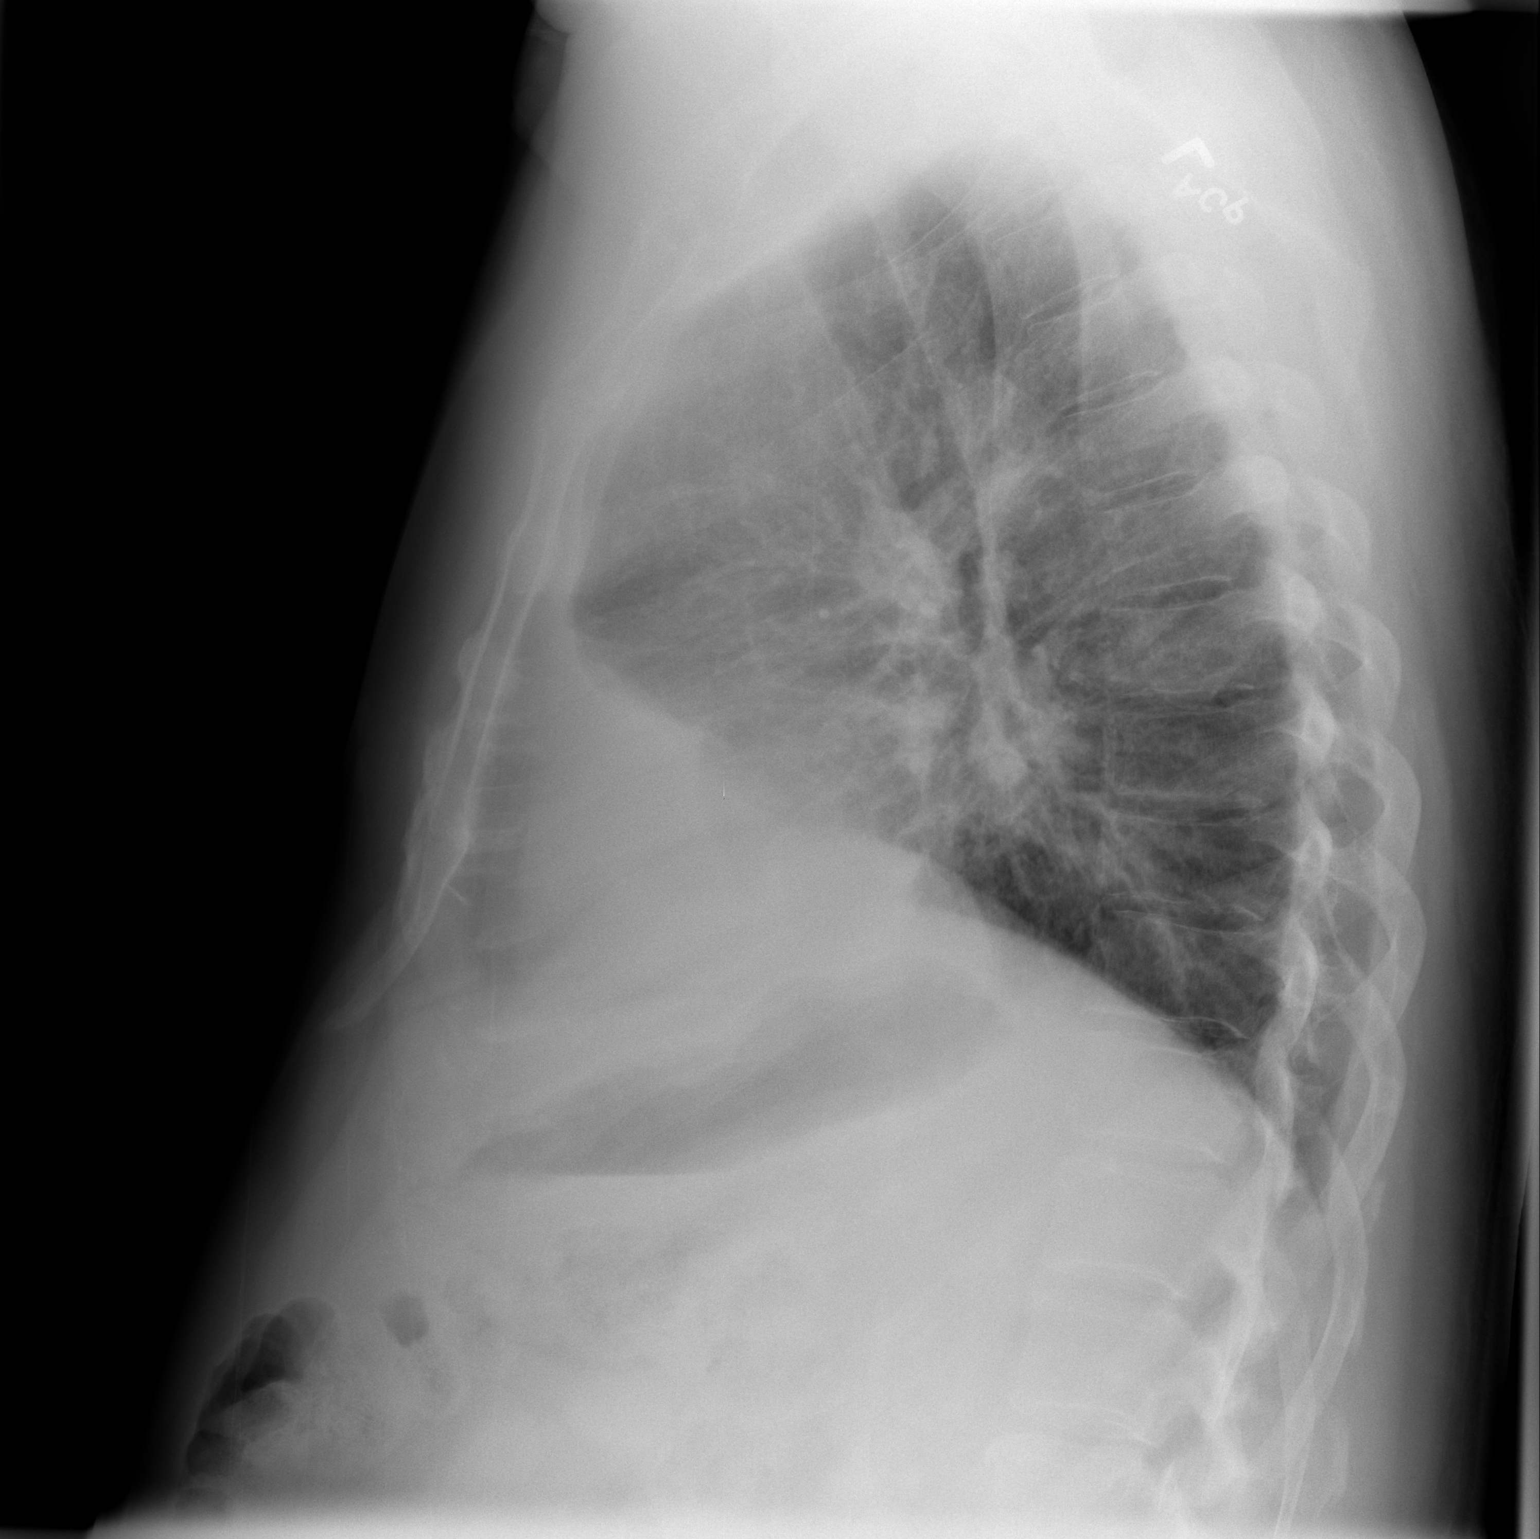

[2 of 2 positions shown; findings below may reference images not displayed]

FINDINGS: Cardiac enlargement.
Stable mediastinal contours.
Pulmonary vascular congestion.
Right basilar atelectasis with question early bibasilar pulmonary
infiltrates, greater on right.
No pleural effusion or pneumothorax.
IMPRESSION: Cardiomegaly with pulmonary venous hypertension.
Right basilar atelectasis with question early bibasilar infiltrates
right greater than left.

## 2008-08-15 ENCOUNTER — Inpatient Hospital Stay (HOSPITAL_COMMUNITY): Admission: EM | Admit: 2008-08-15 | Discharge: 2008-08-22 | Payer: Self-pay | Admitting: Emergency Medicine

## 2008-08-15 ENCOUNTER — Ambulatory Visit: Payer: Self-pay | Admitting: Pulmonary Disease

## 2008-08-15 IMAGING — CT CT ANGIO CHEST
2 of 6 series · 19 of 36 positions shown · IV contrast (APPLIED)
Comparison: [DATE]

CLINICAL DATA: Shortness of breath, chest pain, fever, elevated D-
dimer, history myocardial infarction, sarcoidosis

CT ANGIOGRAPHY CHEST WITH CONTRAST
TECHNIQUE: Multidetector CT imaging of the chest was performed
using the standard protocol during bolus administration of
intravenous contrast. Multiplanar CT image reconstructions
including MIPs were obtained to evaluate the vascular anatomy.
Breast shield utilized.
Contrast: 100 ml [PC] IV

[Series 4: pulm embolism 1.0 b25f thins · axial · 0.70mm/px · z∈[+1018,+1252]mm · 18 of 262 slices shown]
[im 14/262  lung]
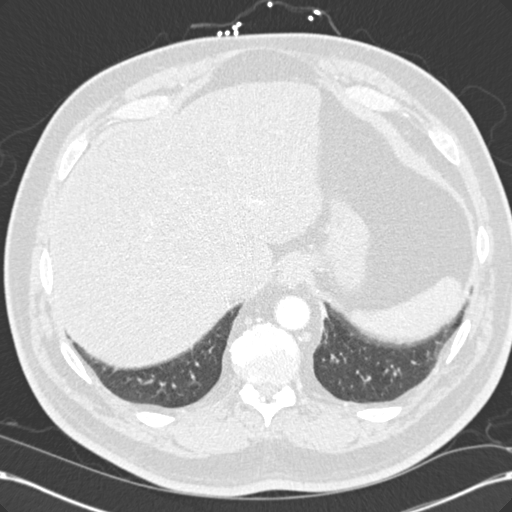
[im 27/262  mediastinal]
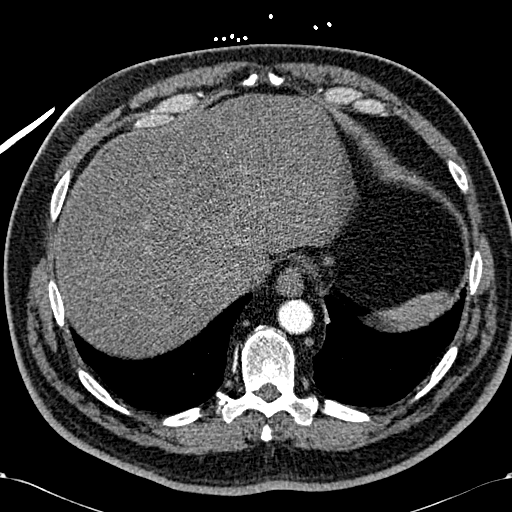
[im 40/262  lung]
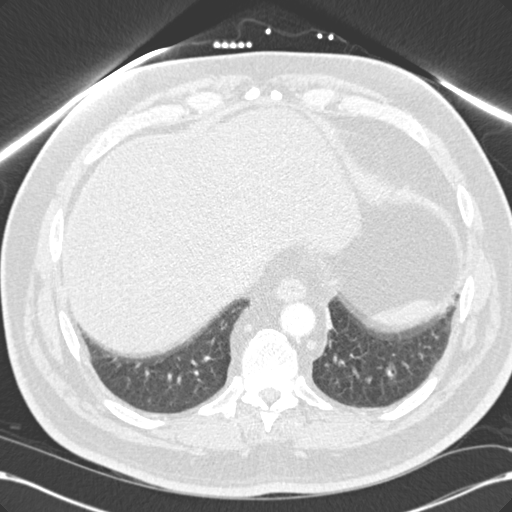
[im 53/262  mediastinal]
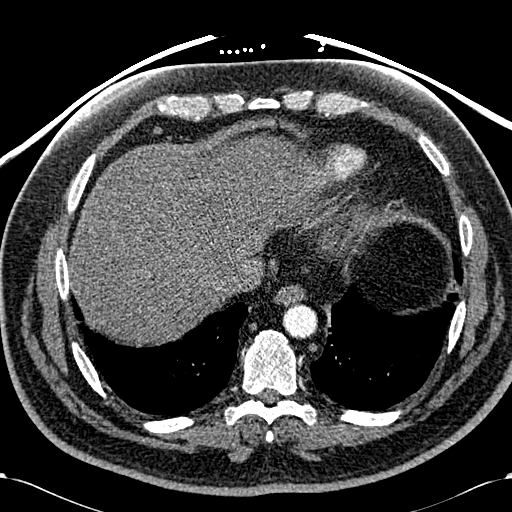
[im 66/262  lung]
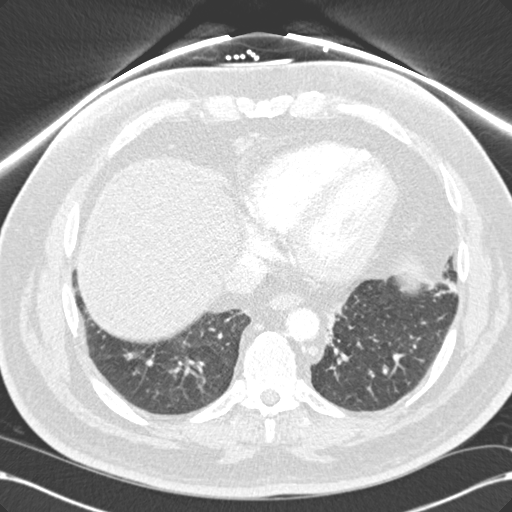
[im 79/262  mediastinal]
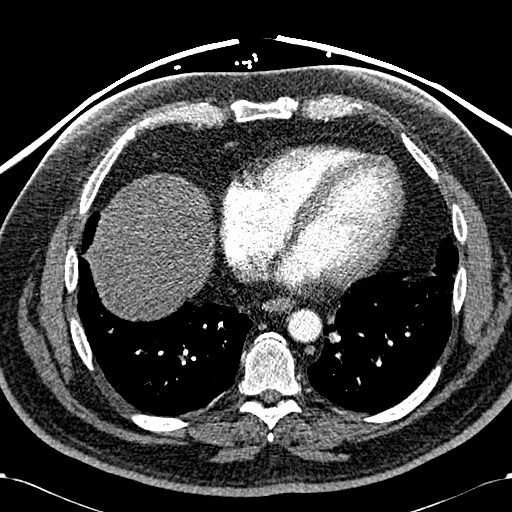
[im 92/262  lung]
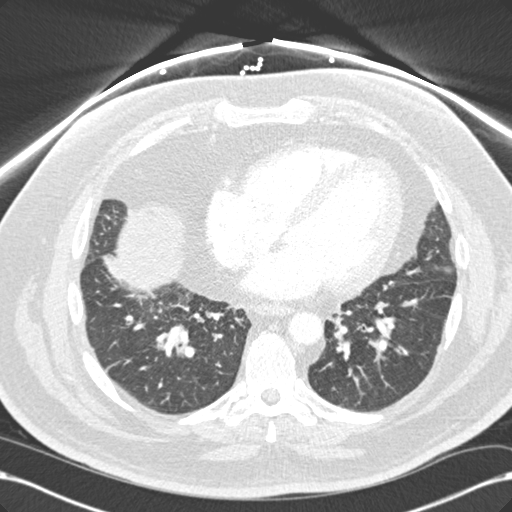
[im 105/262  mediastinal]
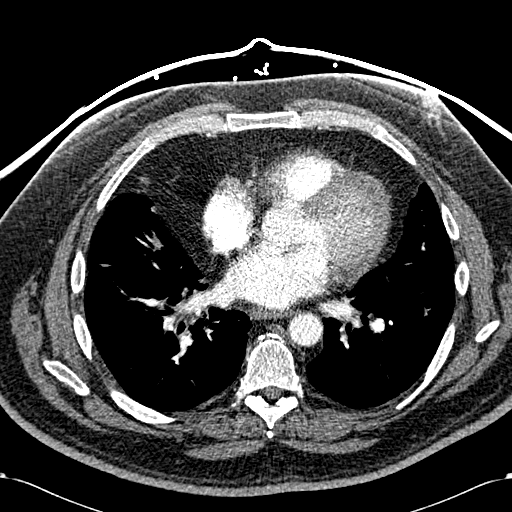
[im 118/262  lung]
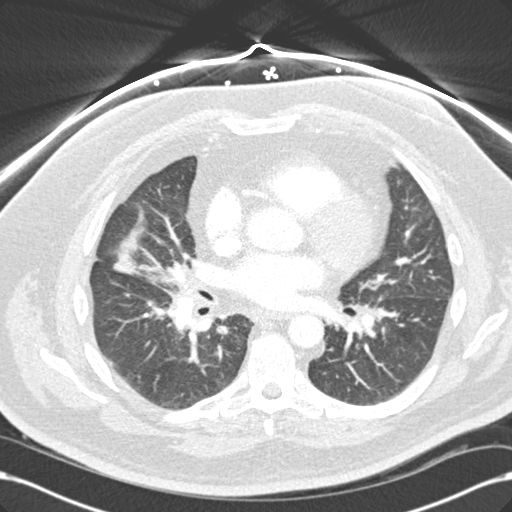
[im 144/262  mediastinal]
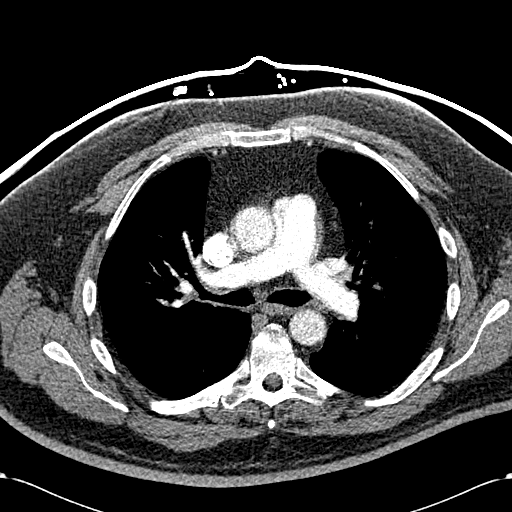
[im 157/262  lung]
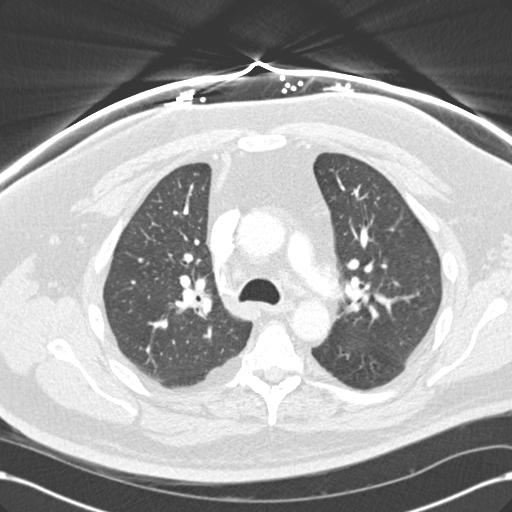
[im 170/262  mediastinal]
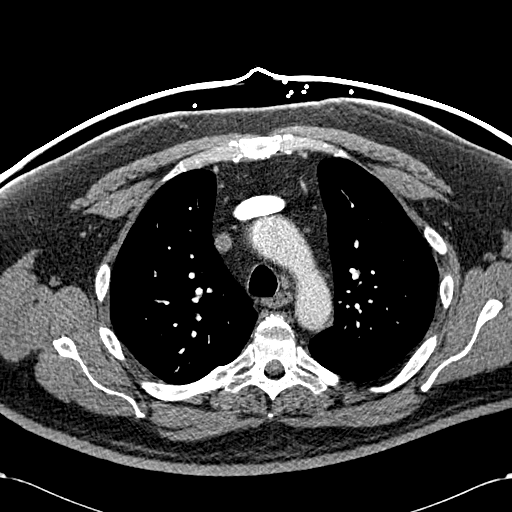
[im 183/262  lung]
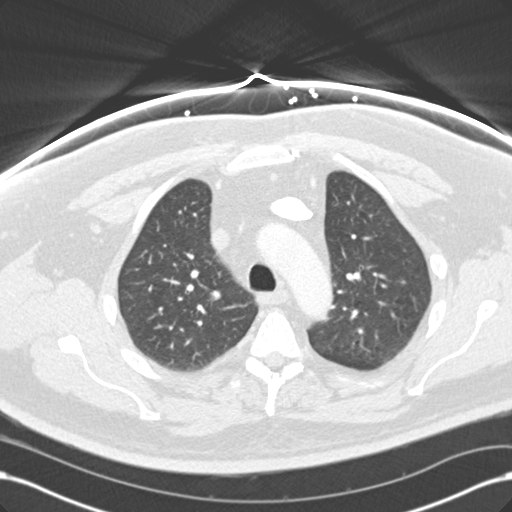
[im 196/262  mediastinal]
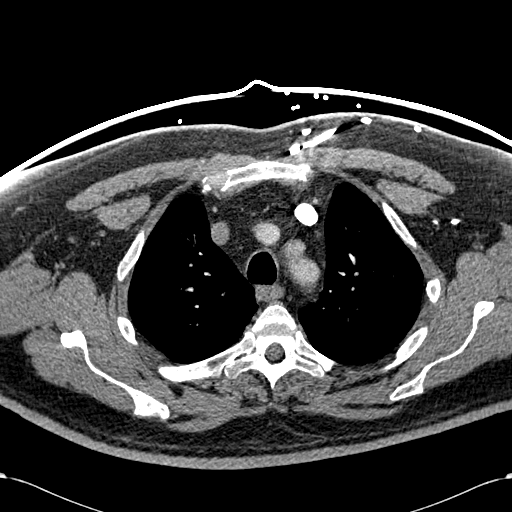
[im 209/262  lung]
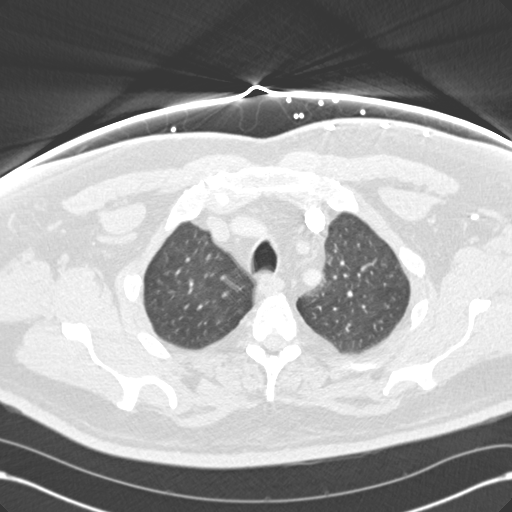
[im 222/262  mediastinal]
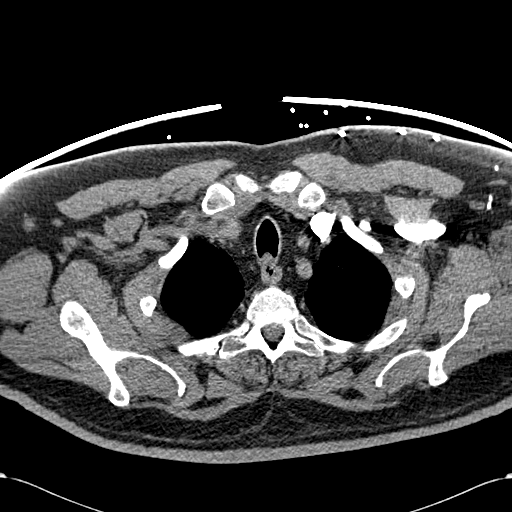
[im 235/262  lung]
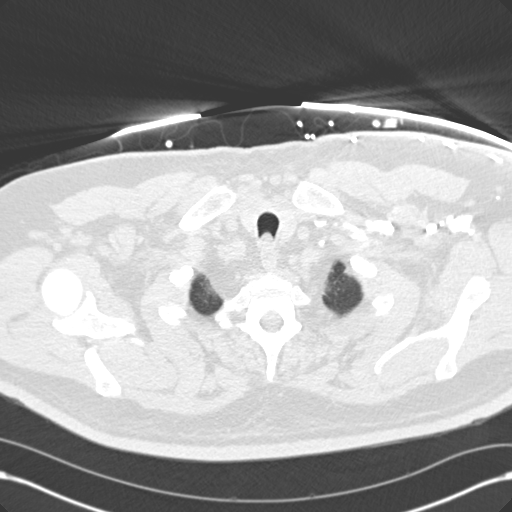
[im 248/262  mediastinal]
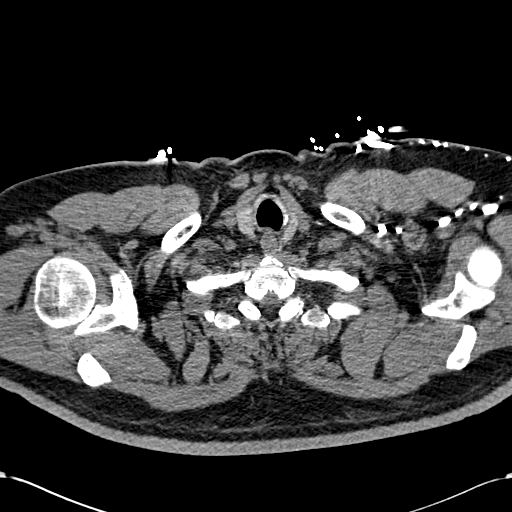

[Series 602: coronal · coronal · 0.70mm/px · 1 of 121 slices shown]
[im 61/121  mediastinal]
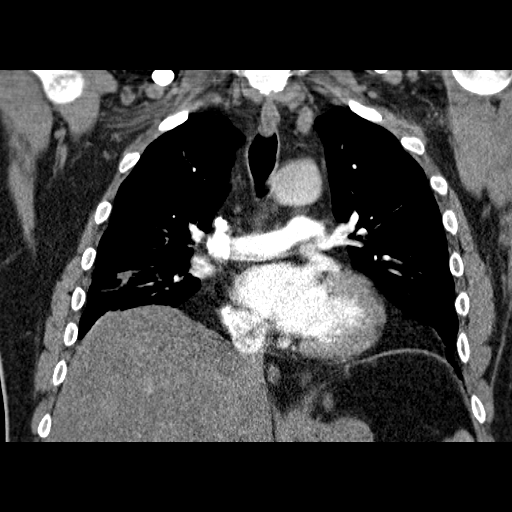

[19 of 36 positions shown; findings below may reference images not displayed]

FINDINGS: Aorta normal caliber without aneurysm or dissection.
Pulmonary arteries patent.
No evidence pulmonary embolism.
Prominent epicardial fat pads.
Visualized portion of upper abdomen normal.
Atelectasis versus infiltrate at right lung base.
Remaining lungs clear.
No pleural effusion or pneumothorax.
No thoracic adenopathy.
Bones unremarkable.

Review of the MIP images confirms the above findings.
IMPRESSION: No evidence of pulmonary embolism.
Right basilar atelectasis versus infiltrate.
No other intrathoracic abnormalities.

## 2008-08-16 IMAGING — CR DG CHEST 2V
2 series · 2 of 2 positions shown · non-contrast
Comparison: [DATE]

CLINICAL DATA: Shortness of breath, fever

CHEST - 2 VIEW

[w chest pa]
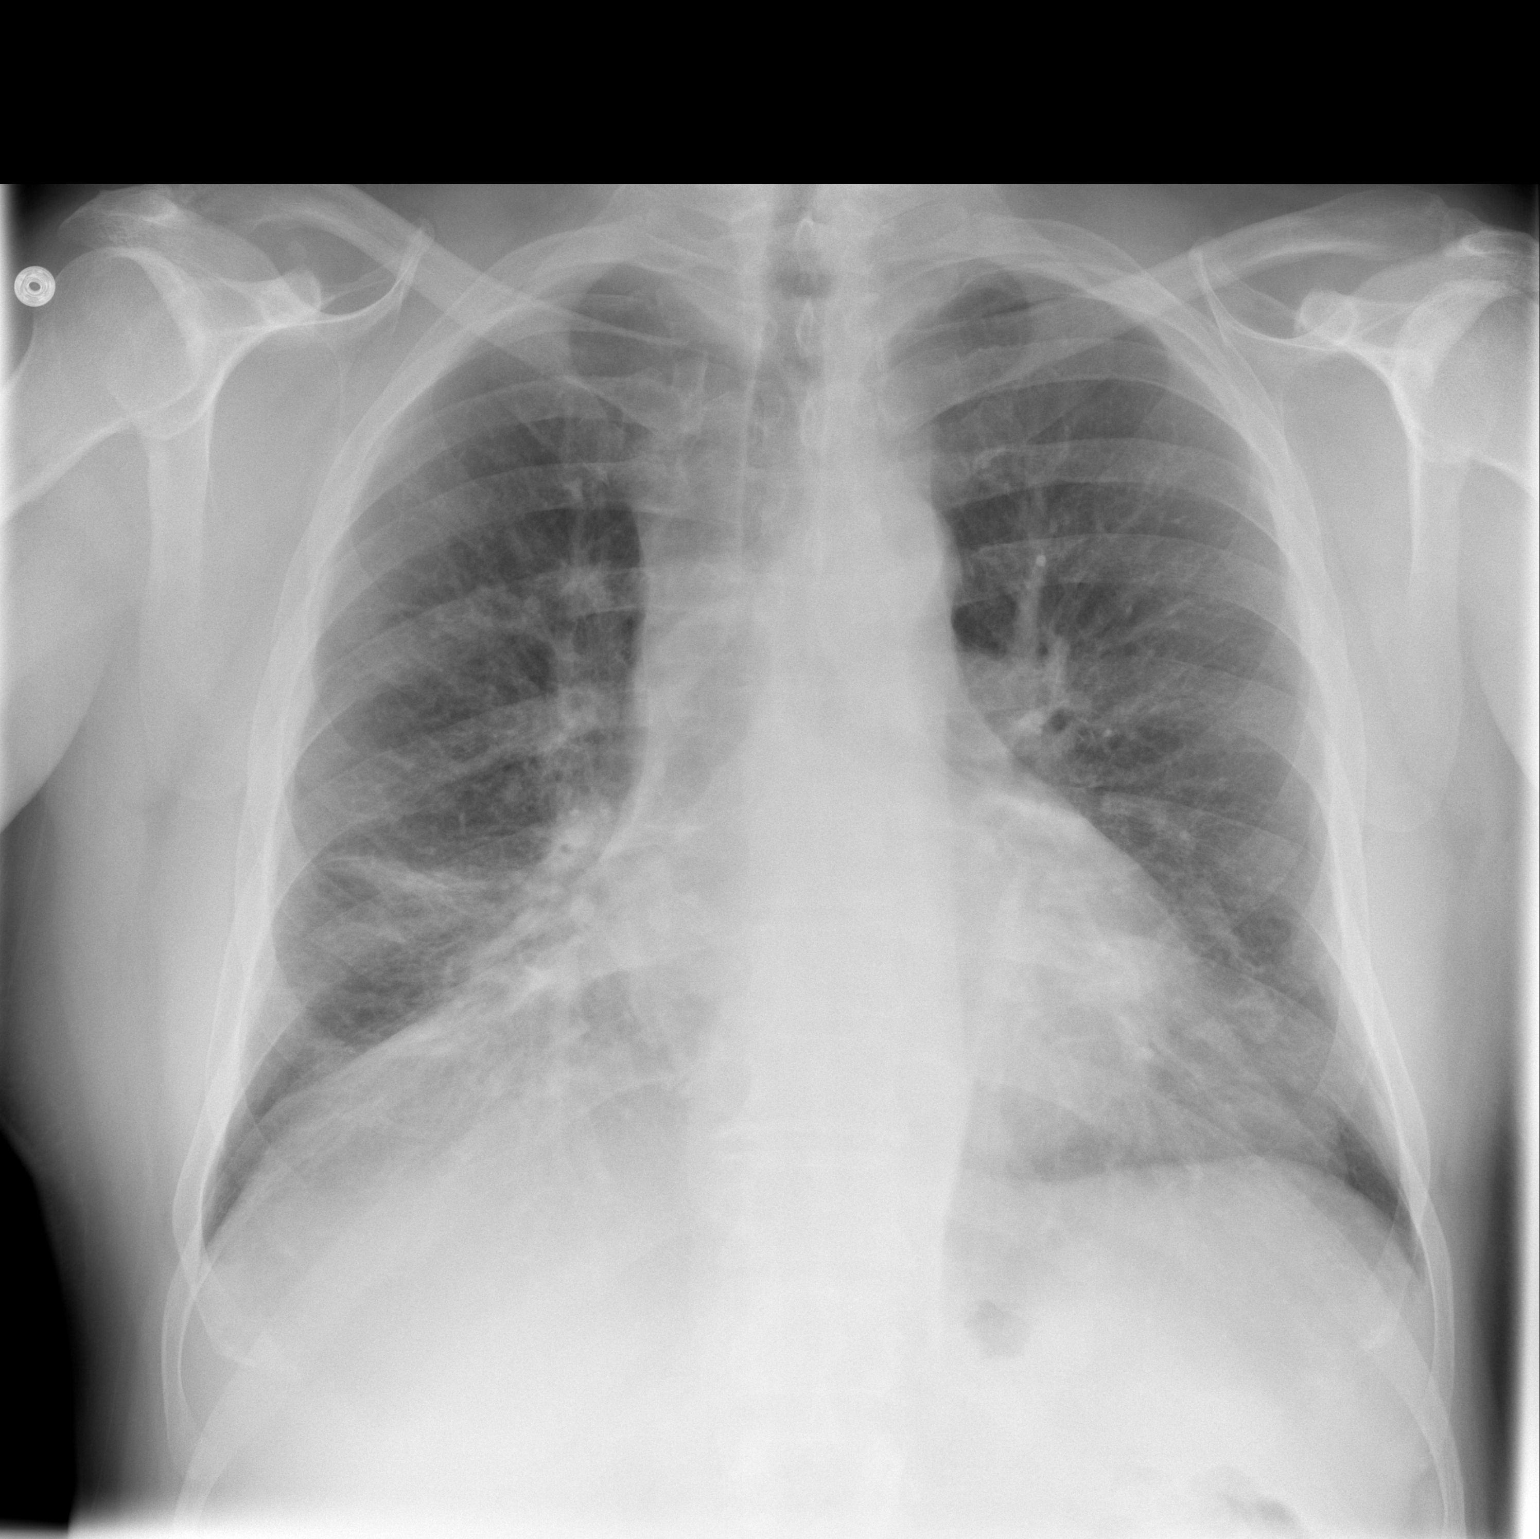

[w chest lat]
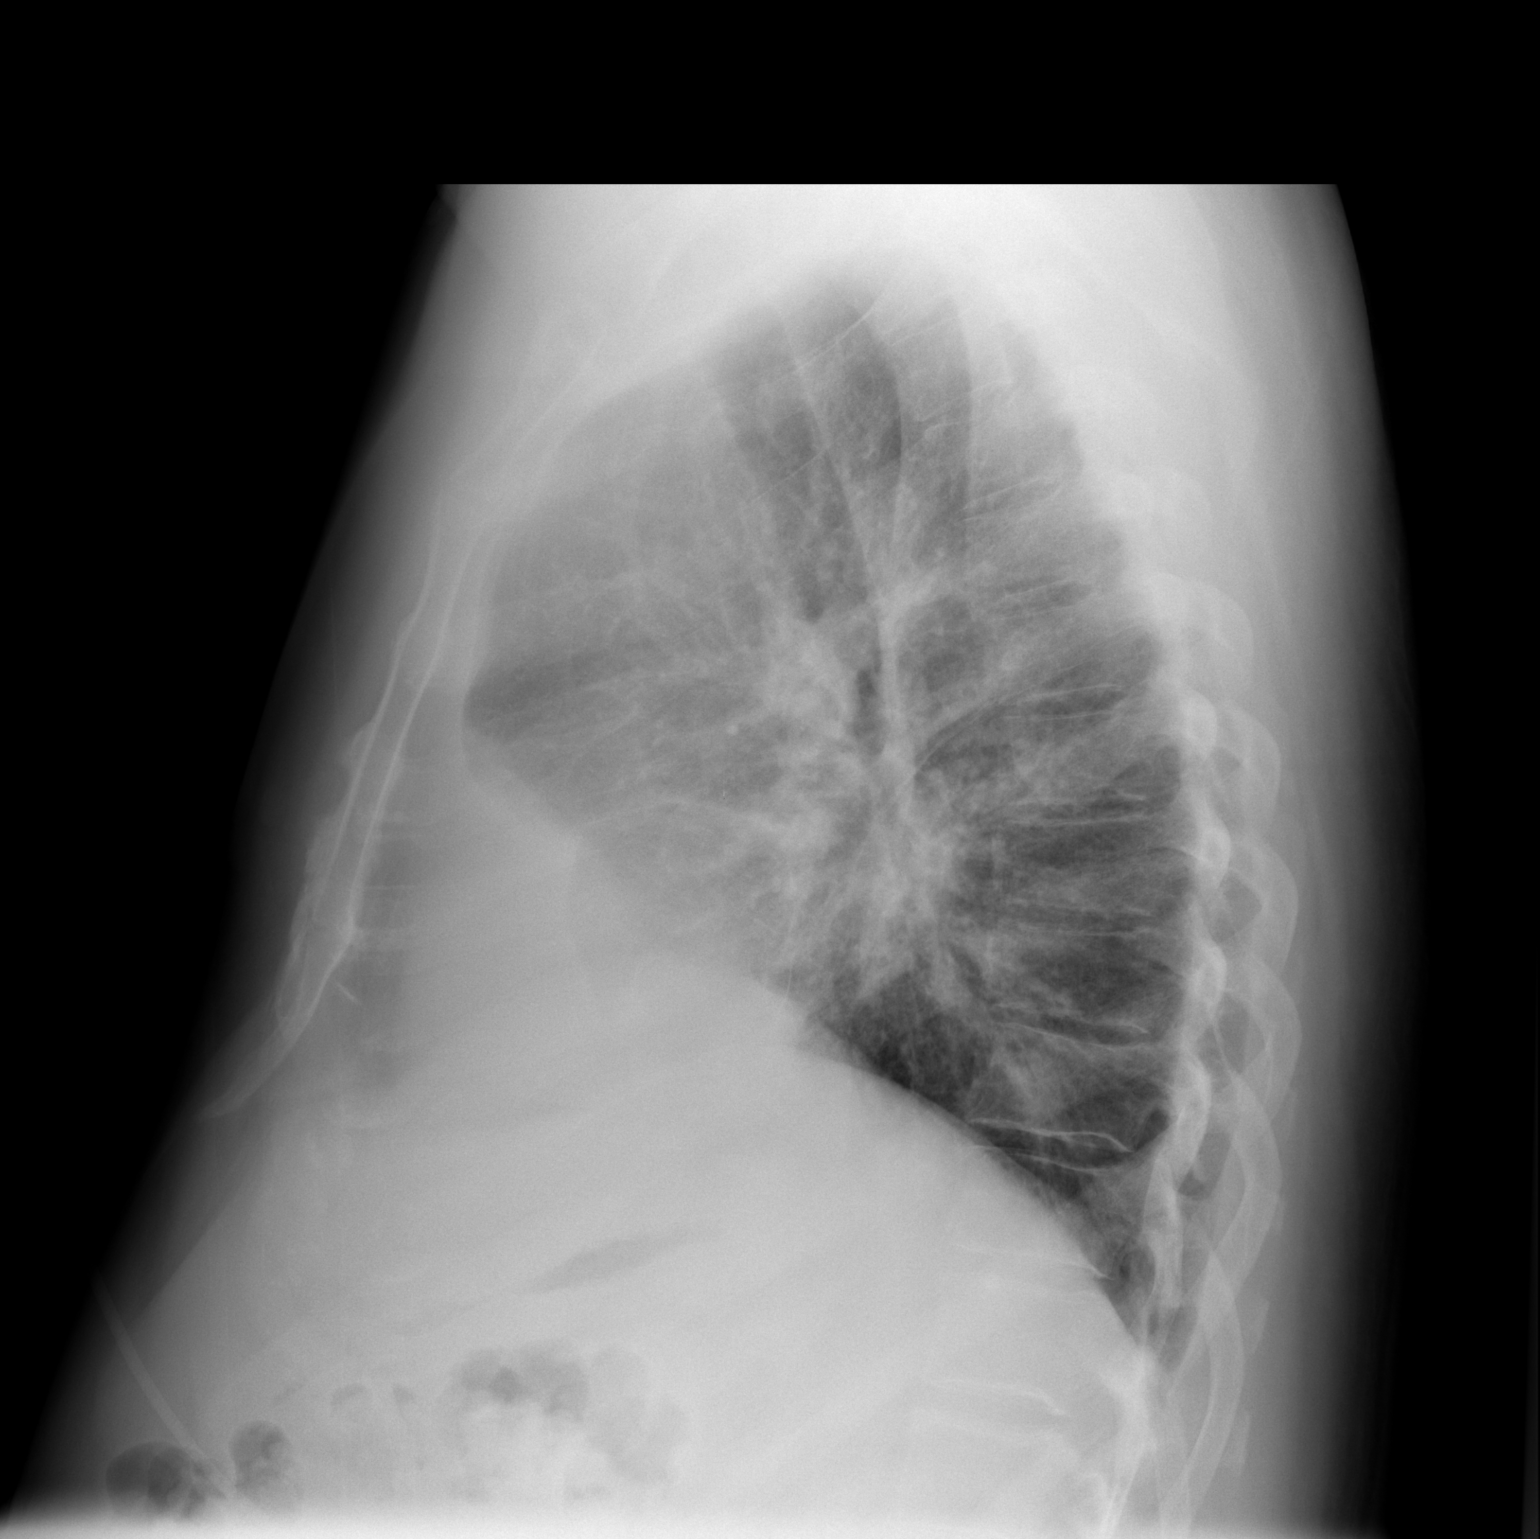

[2 of 2 positions shown; findings below may reference images not displayed]

FINDINGS: Patchy right middle lobe infiltrate or atelectasis.
Coarse perihilar interstitial opacities as before.  No effusion.
Heart size upper limits normal.  Visualized bones unremarkable.
IMPRESSION: 1.  Stable right middle lobe patchy infiltrate or atelectasis.
2.  Stable coarse perihilar interstitial opacities.

## 2008-09-01 ENCOUNTER — Ambulatory Visit: Payer: Self-pay | Admitting: Critical Care Medicine

## 2008-09-04 ENCOUNTER — Ambulatory Visit: Payer: Self-pay | Admitting: Critical Care Medicine

## 2008-10-03 ENCOUNTER — Encounter: Payer: Self-pay | Admitting: Critical Care Medicine

## 2008-10-09 ENCOUNTER — Ambulatory Visit: Payer: Self-pay | Admitting: Critical Care Medicine

## 2008-10-09 DIAGNOSIS — G4733 Obstructive sleep apnea (adult) (pediatric): Secondary | ICD-10-CM | POA: Insufficient documentation

## 2008-10-09 DIAGNOSIS — G4730 Sleep apnea, unspecified: Secondary | ICD-10-CM

## 2008-11-13 ENCOUNTER — Ambulatory Visit: Payer: Self-pay | Admitting: Critical Care Medicine

## 2008-11-25 ENCOUNTER — Encounter: Admission: RE | Admit: 2008-11-25 | Discharge: 2009-01-01 | Payer: Self-pay | Admitting: Orthopedic Surgery

## 2008-11-29 ENCOUNTER — Encounter: Payer: Self-pay | Admitting: Critical Care Medicine

## 2009-01-01 ENCOUNTER — Ambulatory Visit (HOSPITAL_BASED_OUTPATIENT_CLINIC_OR_DEPARTMENT_OTHER): Admission: RE | Admit: 2009-01-01 | Discharge: 2009-01-01 | Payer: Self-pay | Admitting: Critical Care Medicine

## 2009-01-01 ENCOUNTER — Ambulatory Visit: Payer: Self-pay | Admitting: Critical Care Medicine

## 2009-01-01 ENCOUNTER — Ambulatory Visit: Payer: Self-pay | Admitting: Diagnostic Radiology

## 2009-01-01 IMAGING — CR DG CHEST 2V
2 series · 2 of 2 positions shown · non-contrast
Comparison: [DATE] study.

CLINICAL DATA: History given of sarcoidosis and dyspnea.

CHEST - 2 VIEW

[w chest pa]
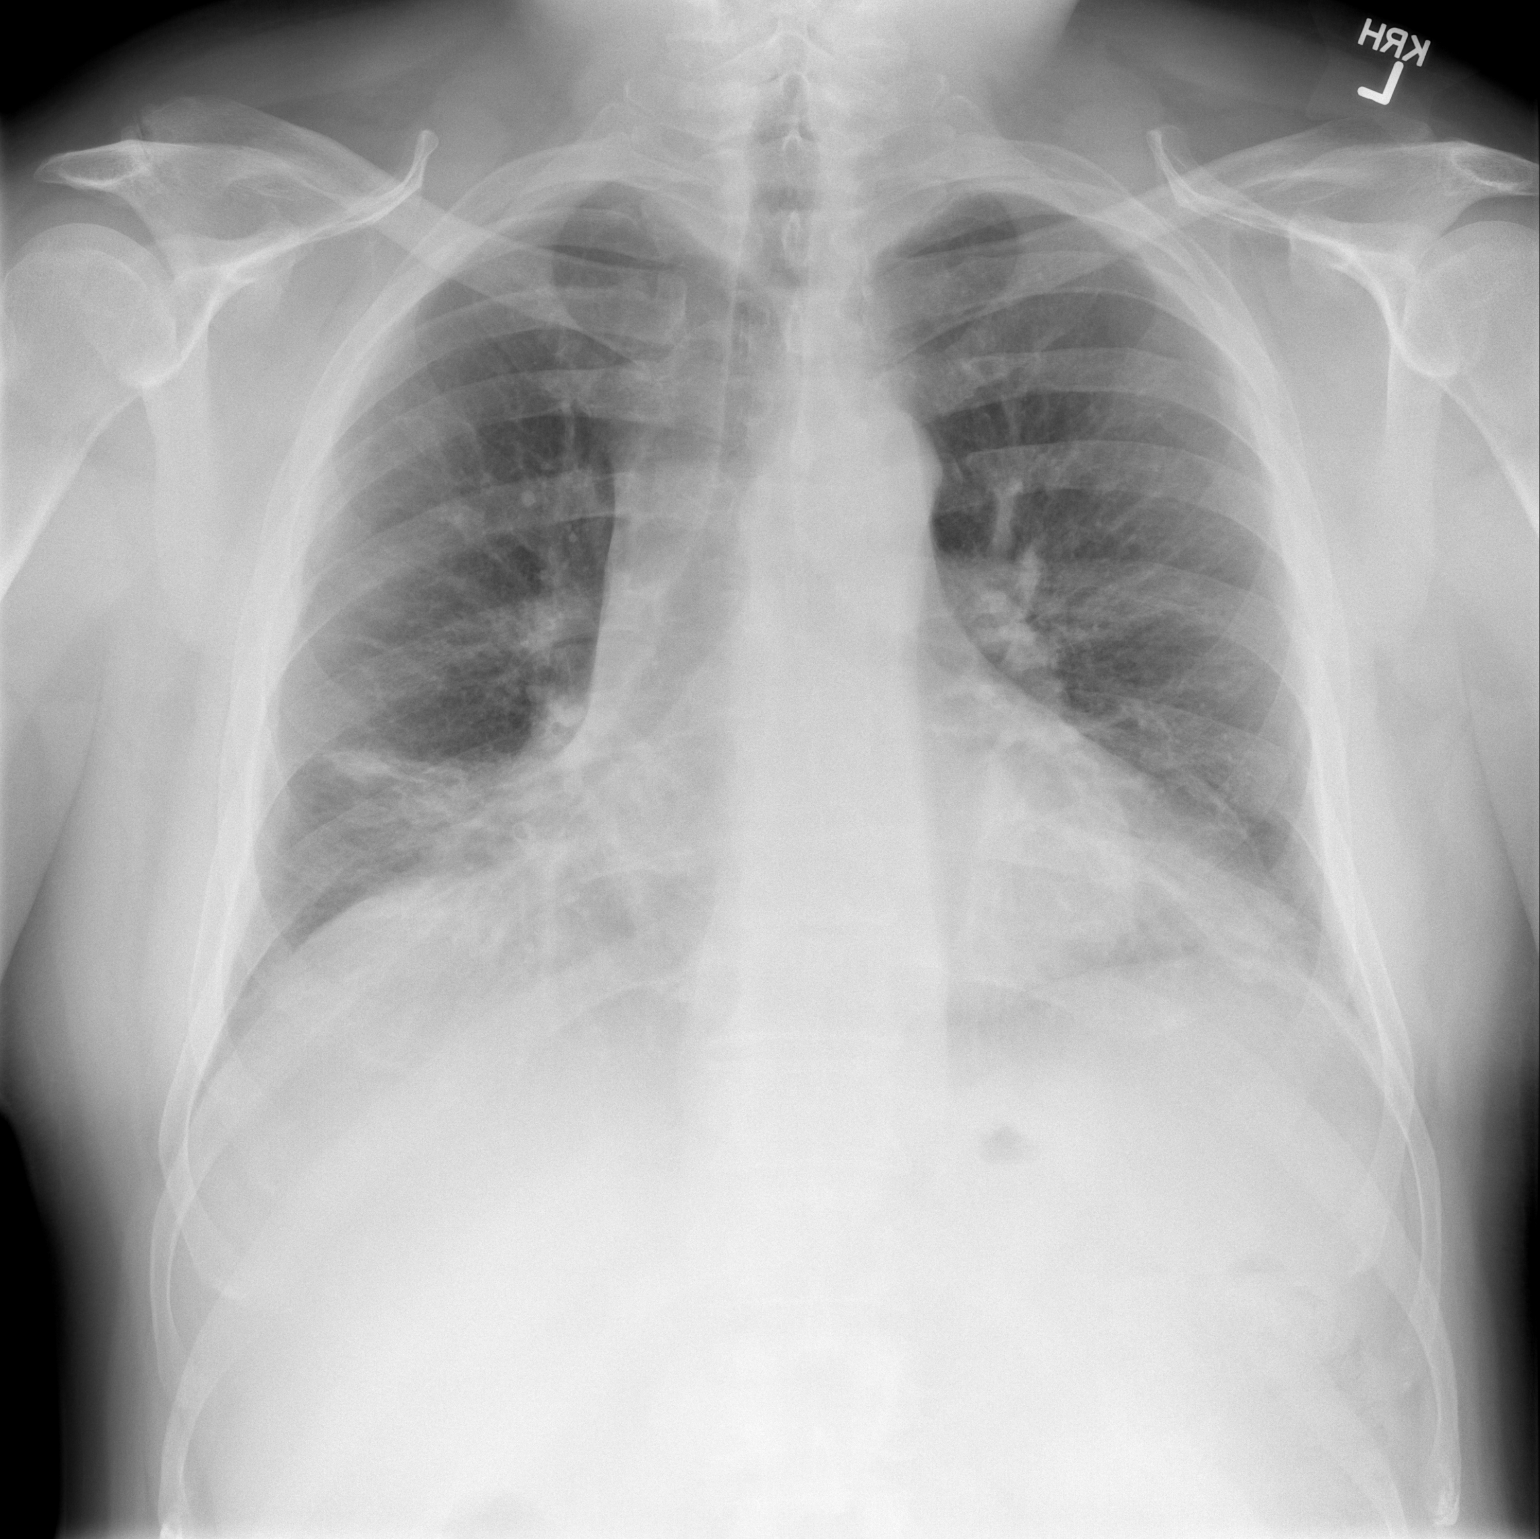

[w chest lat]
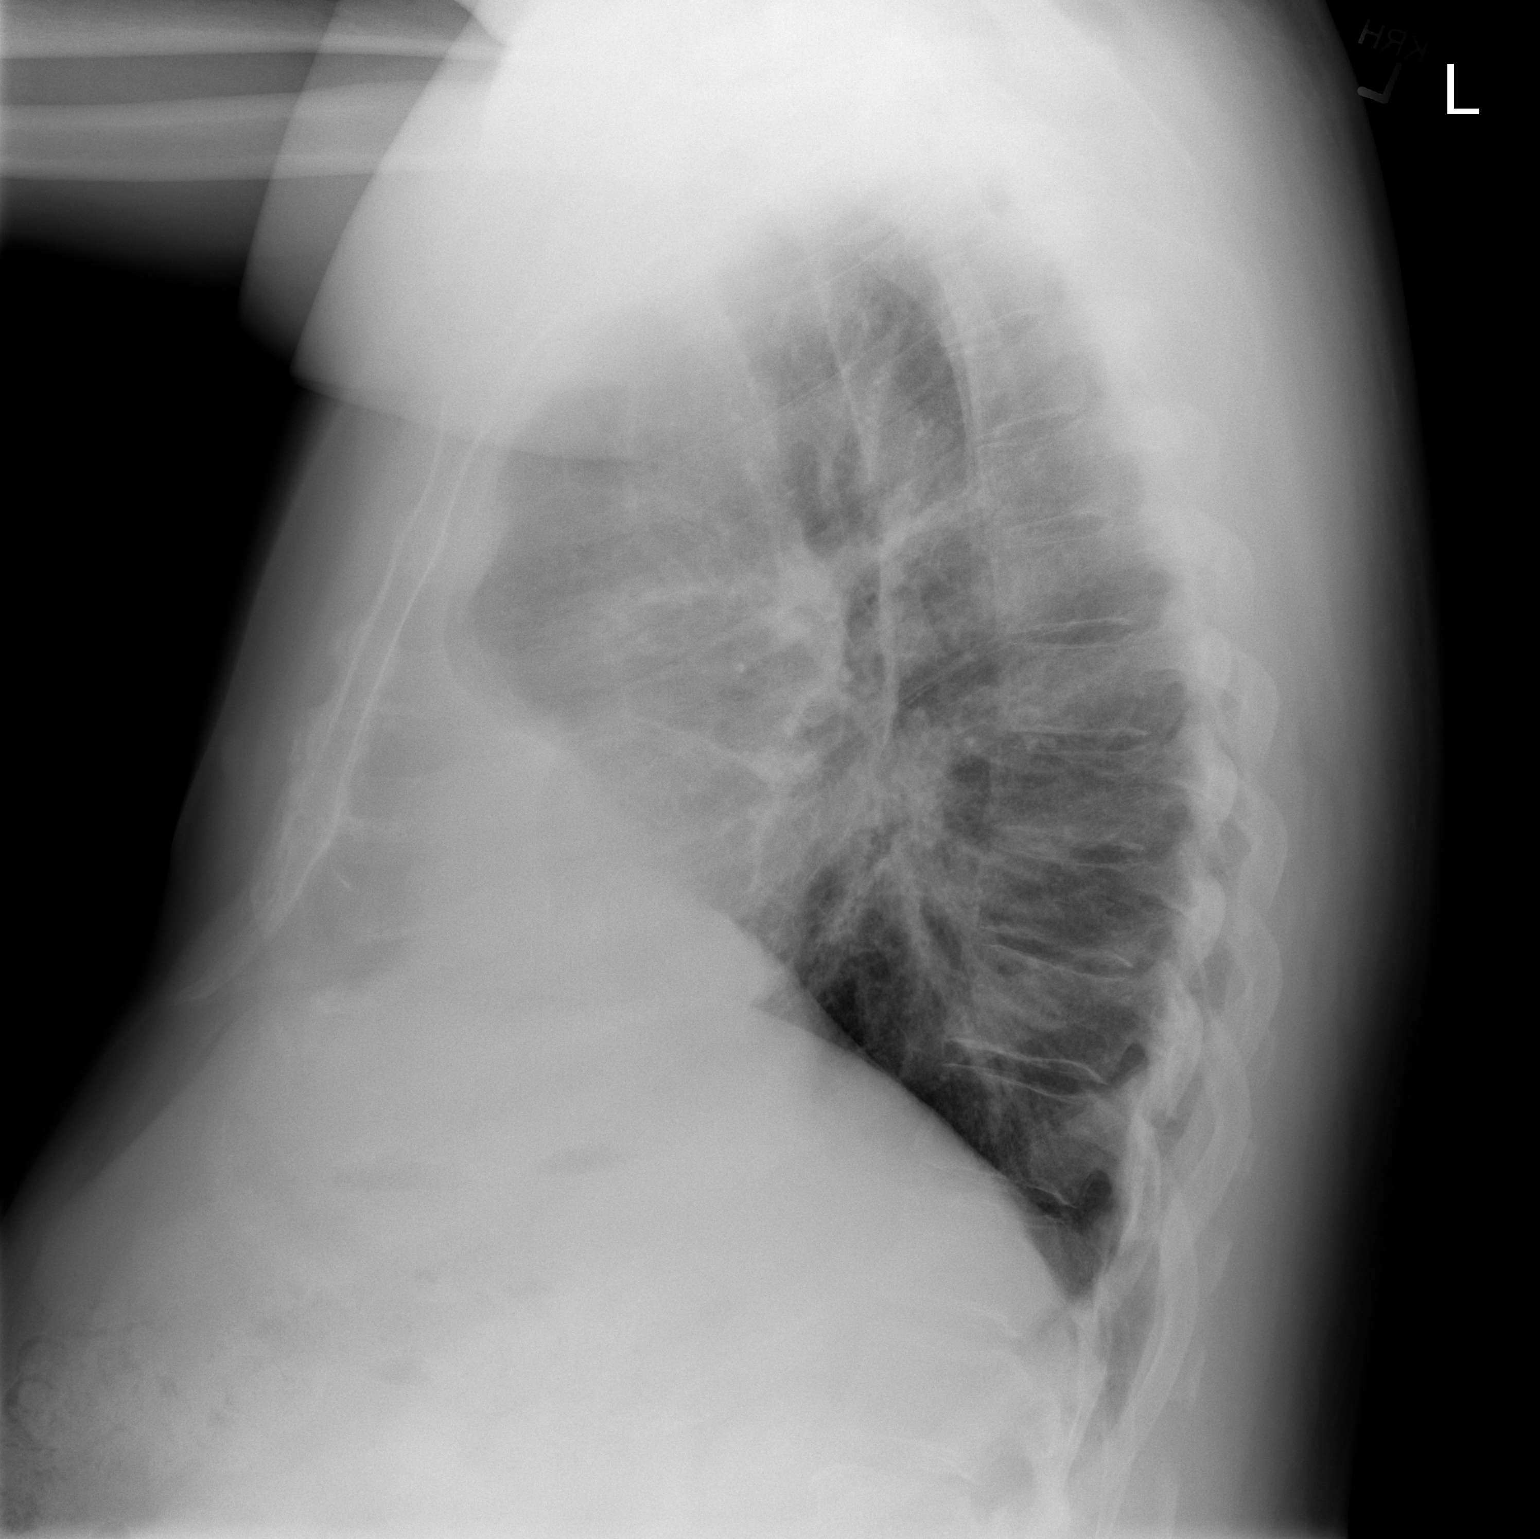

[2 of 2 positions shown; findings below may reference images not displayed]

FINDINGS: There is evidence of chronic atelectasis and infiltrate
and fibrosis involving the right middle lobe.  This is stable.  No
pleural effusion or adenopathy is evident. The cardiac silhouette
is normal size and shape. Bones appear average for age.
IMPRESSION: There is stable chronic atelectasis, fibrosis, and infiltrative
density involving the right middle lobe.  No new infiltrates are
evident.  No adenopathy is seen.  No acute process is evident.

## 2009-01-04 ENCOUNTER — Encounter: Payer: Self-pay | Admitting: Critical Care Medicine

## 2009-01-22 ENCOUNTER — Ambulatory Visit: Payer: Self-pay | Admitting: Critical Care Medicine

## 2009-01-23 LAB — CONVERTED CEMR LAB
BUN: 28 mg/dL — ABNORMAL HIGH (ref 6–23)
Chloride: 99 meq/L (ref 96–112)
Creatinine, Ser: 1.35 mg/dL (ref 0.40–1.50)
Potassium: 4.6 meq/L (ref 3.5–5.3)

## 2009-02-04 ENCOUNTER — Telehealth: Payer: Self-pay | Admitting: Critical Care Medicine

## 2009-02-09 ENCOUNTER — Ambulatory Visit: Payer: Self-pay | Admitting: Diagnostic Radiology

## 2009-02-09 ENCOUNTER — Ambulatory Visit (HOSPITAL_BASED_OUTPATIENT_CLINIC_OR_DEPARTMENT_OTHER): Admission: RE | Admit: 2009-02-09 | Discharge: 2009-02-09 | Payer: Self-pay | Admitting: Critical Care Medicine

## 2009-02-09 IMAGING — CT CT CHEST W/O CM
2 of 3 series · 15 of 36 positions shown, 18 images · non-contrast
Comparison: [DATE].

CLINICAL DATA: Sarcoidosis.  Mediastinal lymphadenopathy.  Renal
insufficiency.

CT CHEST WITHOUT CONTRAST
TECHNIQUE: Multidetector CT imaging of the chest was performed
following the standard protocol without IV contrast.

[Series 2: chest 5.0 b31f · axial · 0.76mm/px · z∈[+1102,+1392]mm · 12 of 70 slices shown, 15 images]
[im 6/70  mediastinal]
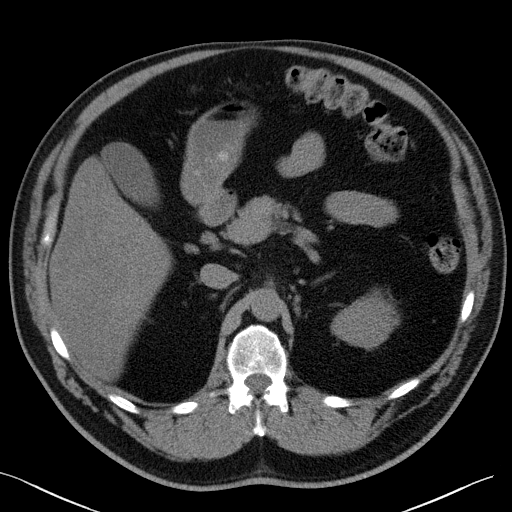
[im 6/70  lung]
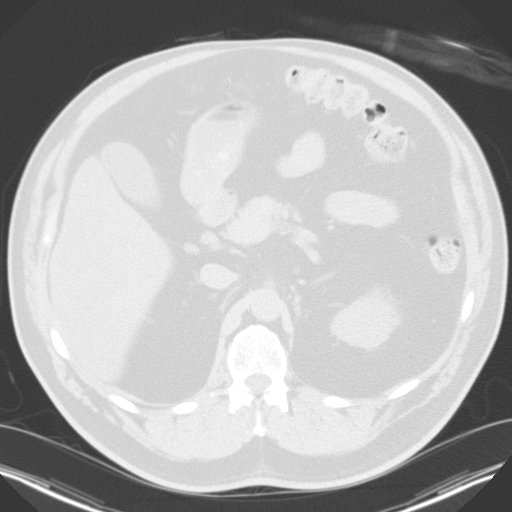
[im 11/70  lung]
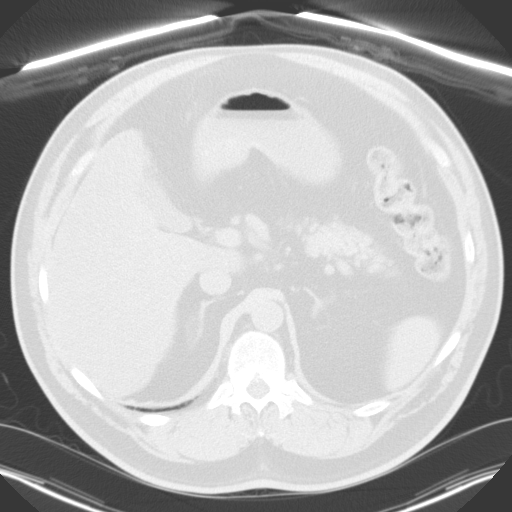
[im 16/70  lung]
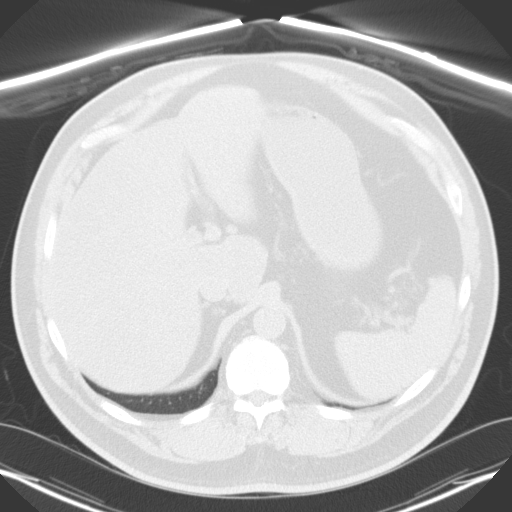
[im 21/70  lung]
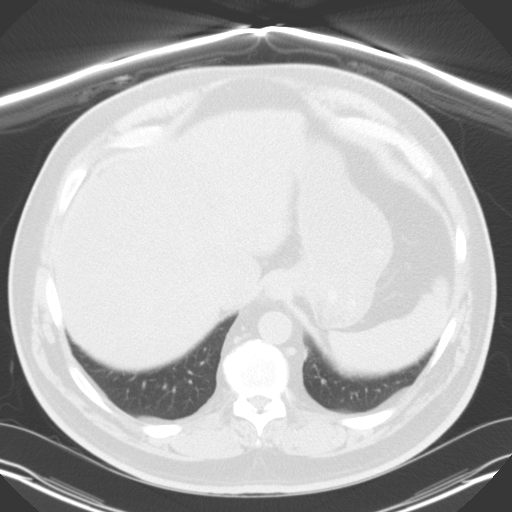
[im 26/70  mediastinal]
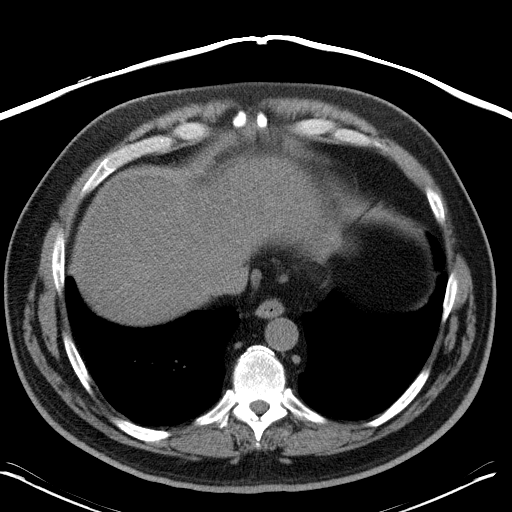
[im 26/70  lung]
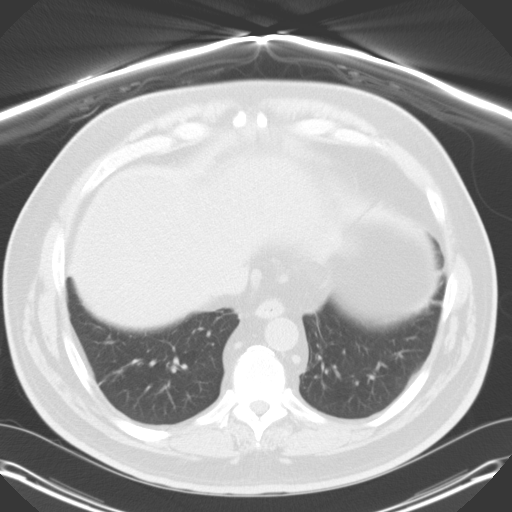
[im 31/70  lung]
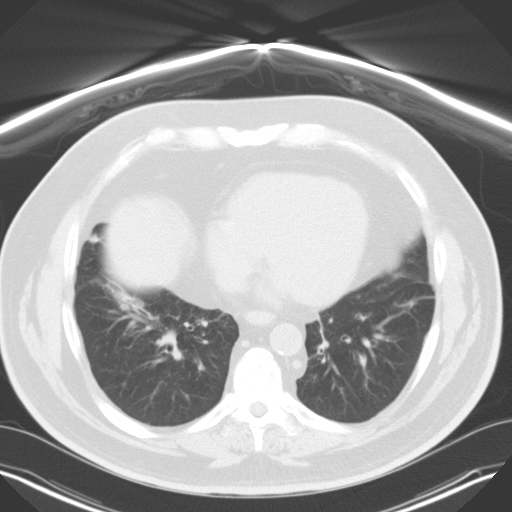
[im 39/70  lung]
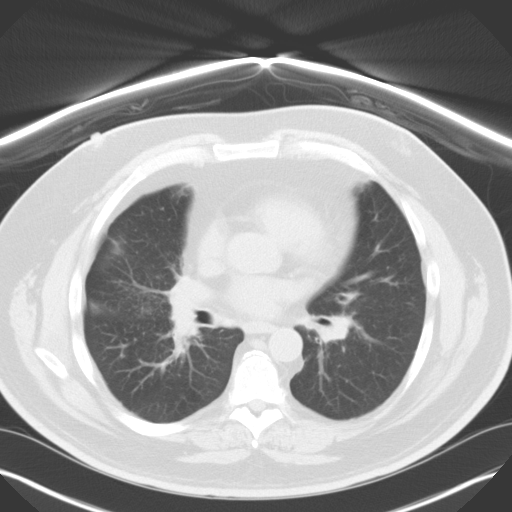
[im 44/70  lung]
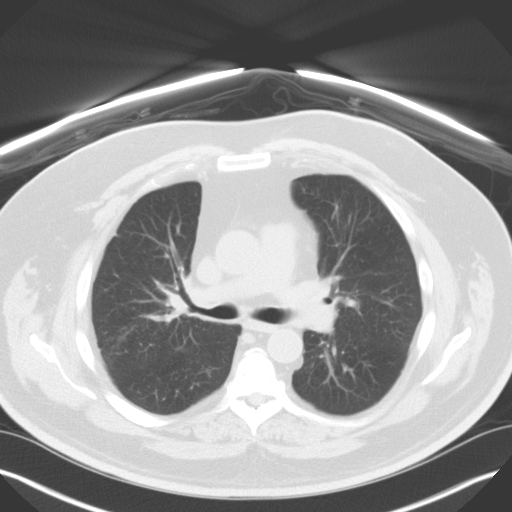
[im 49/70  mediastinal]
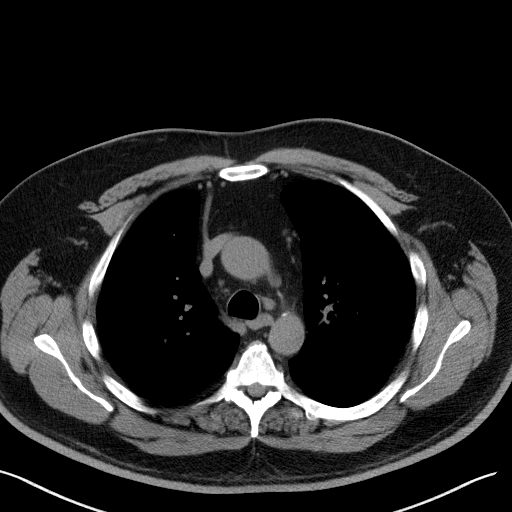
[im 49/70  lung]
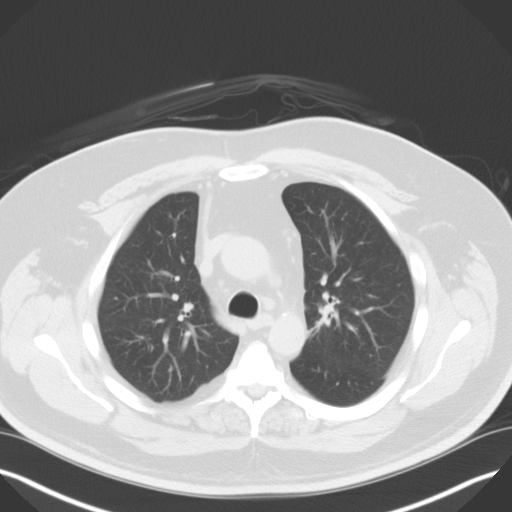
[im 54/70  lung]
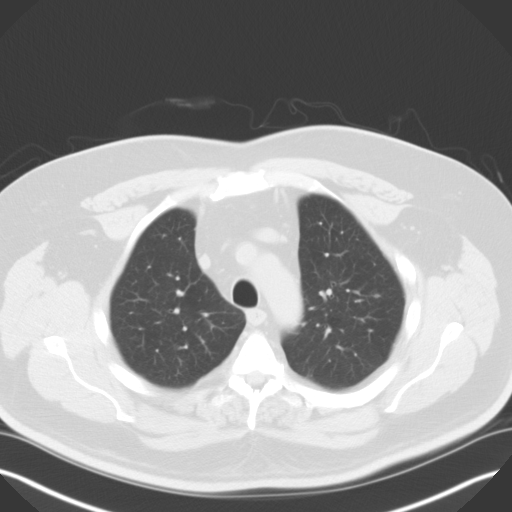
[im 59/70  lung]
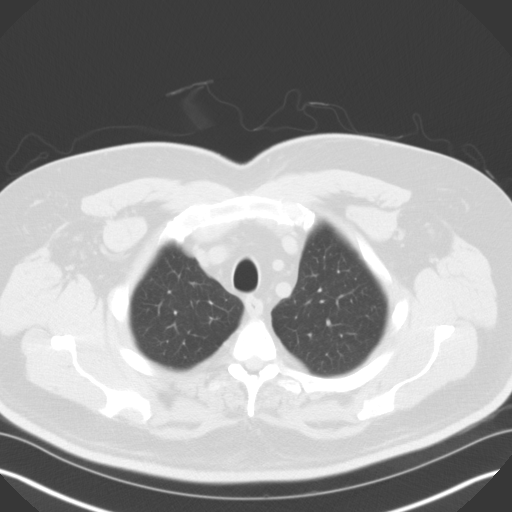
[im 64/70  lung]
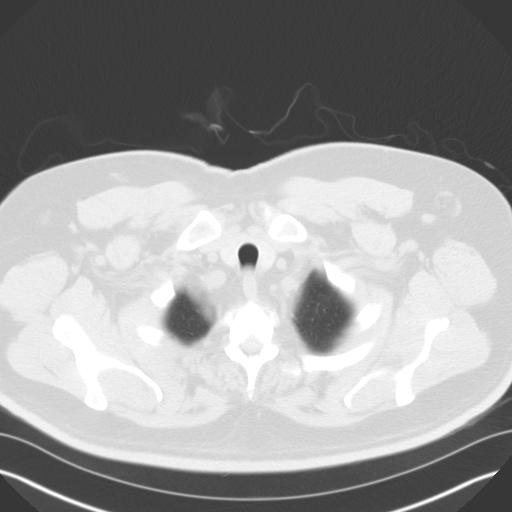

[Series 6: chest 3.0 coronal · coronal · 0.70mm/px · 3 of 111 slices shown]
[im 23/111  lung]
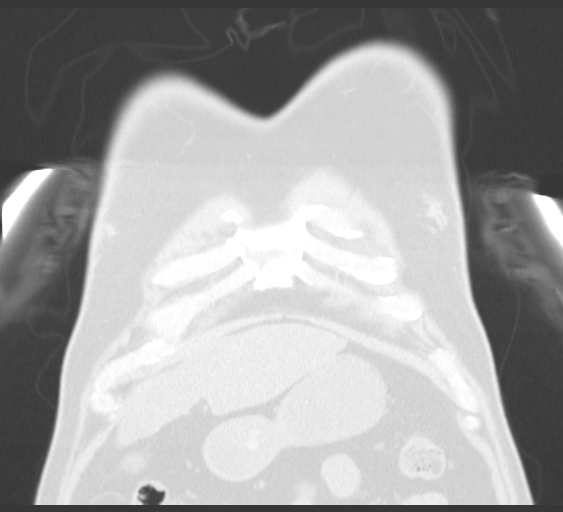
[im 45/111  lung]
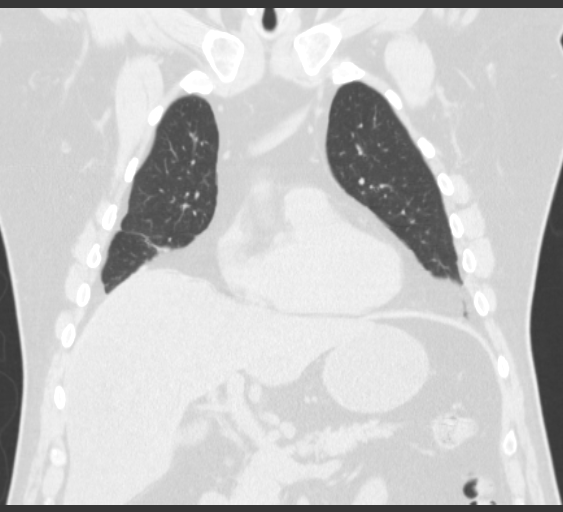
[im 67/111  lung]
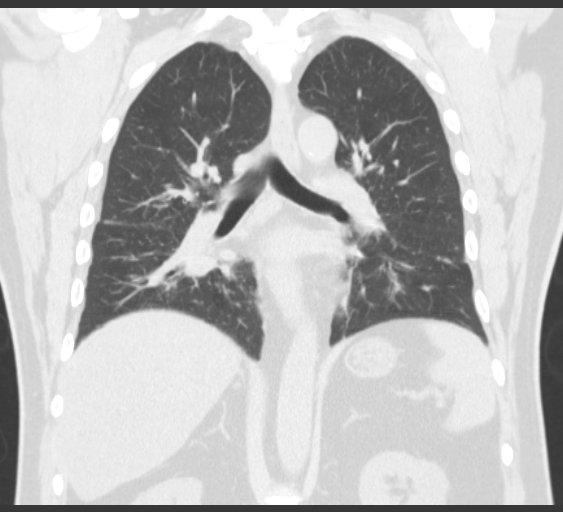

[15 of 36 positions shown; findings below may reference images not displayed]

FINDINGS: No axillary lymphadenopathy.  The scattered small lymph
nodes are seen in the mediastinum but no individual node meets
criteria for pathologic enlargement.  The abnormal soft tissue
attenuation seen in the right hilum on the previous study,
extending out the bronchovascular bundles into the right middle and
lower lobes persist without substantial interval change.  Tiny
nodule in the anteromedial right middle lobe is stable.

Bone windows reveal no worrisome lytic or sclerotic osseous
lesions. There is diffuse fatty infiltration of the liver.  Small
lymph nodes are seen in the hepatoduodenal ligament and
gastrohepatic ligament. No focal airspace disease in the left lung.
IMPRESSION: No substantial change exam.  The soft tissue attenuation in the
right infrahilar region with irregularity of the bronchovascular
bundles extending into the right middle lobe and right lower lobe
has not progressed in the interval and probably represents
scarring.  Follow-up imaging in 6-12 months could be used to ensure
stability.

## 2009-02-10 ENCOUNTER — Encounter: Payer: Self-pay | Admitting: Critical Care Medicine

## 2009-02-26 ENCOUNTER — Encounter: Payer: Self-pay | Admitting: Critical Care Medicine

## 2009-03-07 HISTORY — PX: CATARACT EXTRACTION: SUR2

## 2009-03-19 ENCOUNTER — Ambulatory Visit: Payer: Self-pay | Admitting: Critical Care Medicine

## 2009-04-17 ENCOUNTER — Encounter: Payer: Self-pay | Admitting: Critical Care Medicine

## 2009-09-03 ENCOUNTER — Ambulatory Visit: Payer: Self-pay | Admitting: Critical Care Medicine

## 2009-10-15 ENCOUNTER — Ambulatory Visit (HOSPITAL_COMMUNITY): Admission: RE | Admit: 2009-10-15 | Discharge: 2009-10-15 | Payer: Self-pay | Admitting: Cardiology

## 2009-11-11 ENCOUNTER — Ambulatory Visit (HOSPITAL_COMMUNITY): Admission: RE | Admit: 2009-11-11 | Discharge: 2009-11-12 | Payer: Self-pay | Admitting: Interventional Cardiology

## 2009-11-25 ENCOUNTER — Encounter: Payer: Self-pay | Admitting: Gastroenterology

## 2009-12-01 ENCOUNTER — Telehealth (INDEPENDENT_AMBULATORY_CARE_PROVIDER_SITE_OTHER): Payer: Self-pay | Admitting: *Deleted

## 2010-02-11 ENCOUNTER — Ambulatory Visit: Payer: Self-pay | Admitting: Critical Care Medicine

## 2010-03-28 ENCOUNTER — Encounter: Payer: Self-pay | Admitting: Gastroenterology

## 2010-03-28 ENCOUNTER — Encounter: Payer: Self-pay | Admitting: Family Medicine

## 2010-04-06 NOTE — Assessment & Plan Note (Signed)
Summary: Pulmonary OV   Primary Provider/Referring Provider:  Dr. Holley Bouche  CC:  6 month follow up.  Pt states he had stent placed in Sept.  States he still has SOB with exertion but overall breathing has improved since surgery.  Denies wheezing, chest tightness, and cough..  History of Present Illness:   Pulmonary OV  This is a 65 -year-old, white male with a history of severe pulmonary sarcoidosis with primary mediastinal and hilar lymph node involvement.  The patient also has elements of parenchymal involvement.  Also severe OSA.  The patient had failed Arava   therapy.  The patient also had adverse side effects Imuran.    Failed Arava and Imuran due to side effects.  September 03, 2009 11:45 AM This pt had two cataracts removed and two nerve blocks  in lower back.  Now there is no real cough.   There is no real wheeze,  no pndrip,  stays fatigued,  not as dyspneic with exertion.   No other issues. Doing well with cpap that has helped .   February 11, 2010 2:34 PM Pt notes since 09/03/09 had more dyspnea , cardiology saw and had to put in another stent in back of LV. also went to cardiac rehab and this has helped.  Sats are checked. No cough or mucus.  Dyspnea is at baseline.  Pt denies any significant sore throat, nasal congestion or excess secretions, fever, chills, sweats, unintended weight loss, pleurtic or exertional chest pain, orthopnea PND, or leg swelling Pt denies any increase in rescue therapy over baseline, denies waking up needing it or having any early am or nocturnal exacerbations of coughing/wheezing/or dyspnea.   Current Medications (verified): 1)  Glucotrol 10 Mg  Tabs (Glipizide) .... Two By Mouth Two Times A Day 2)  Losartan Potassium 100 Mg Tabs (Losartan Potassium) .... One By Mouth Daily 3)  Novolog Mix 70/30 70-30 %  Susp (Insulin Aspart Prot & Aspart) .... 40 Units Three Times A Day 4)  Aspirin 81 Mg Tbec (Aspirin) .... Take One Tablet By Mouth Once  Daily 5)  Plavix 75 Mg Tabs (Clopidogrel Bisulfate) .Marland Kitchen.. 1 By Mouth Daily 6)  Fish Oil 1000 Mg Caps (Omega-3 Fatty Acids) .... Take 3 Capsule By Mouth Once A Day 7)  Furosemide 40 Mg Tabs (Furosemide) .... 2 Tabs By Mouth  Every Morning 8)  Klor-Con M10 10 Meq Cr-Tabs (Potassium Chloride Crys Cr) .... Take 2 Tabs Twice Daily 9)  Prednisone 10 Mg  Tabs (Prednisone) .... Take 1 Tablet By Mouth Once A Day 10)  Zetia 10 Mg Tabs (Ezetimibe) .... Once Daily 11)  Centrum Cardio  Tabs (Multiple Vitamins-Minerals) .... Take 1 Tablet By Mouth Two Times A Day 12)  Cpap .... Use At Bedtime  Allergies (verified): 1)  ! * Hydrocortisone  Past History:  Past medical, surgical, family and social histories (including risk factors) reviewed, and no changes noted (except as noted below).  Past Medical History: Reviewed history from 03/19/2009 and no changes required. Diabetes mellitus, type II GERD Sarcoidosis   Stage IV        -Stable CT Chest 12/10 CAD with Stent in LAD and side branch PTCA 2009 with AMI       Dr Katrinka Blazing Adrenal insufficiency    -cortisol 5.4 2009 Sleep Apnea severe    -CPAP rx per Deboraha Sprang PCP  Past Surgical History: Reviewed history from 09/03/2009 and no changes required. Coronary Stent in LAD and side branch PTCA  2009  bilateral cataract surgery 2011 Lumbar spine nerve blocks 2011 x 2  Past Pulmonary History:  Pulmonary History: CT OF CHEST: IMPRESSION: No substantial change exam.  The soft tissue attenuation in the right infrahilar region with irregularity of the bronchovascular bundles extending into the right middle lobe and right lower lobe has not progressed in the interval and probably represents scarring.  Follow-up imaging in 6-12 months could be used to ensure stability. (02/09/2009 9:09)  Family History: Reviewed history from 04/05/2007 and no changes required. Asthma Kidney Disease:  MI/Heart Attack Sarcoidosis  Social History: Reviewed history from  11/20/2007 and no changes required. Never smoked Distribution manager  desk job  [employer]  Review of Systems       The patient complains of shortness of breath with activity.  The patient denies shortness of breath at rest, productive cough, non-productive cough, coughing up blood, chest pain, irregular heartbeats, acid heartburn, indigestion, loss of appetite, weight change, abdominal pain, difficulty swallowing, sore throat, tooth/dental problems, headaches, nasal congestion/difficulty breathing through nose, sneezing, itching, ear ache, anxiety, depression, hand/feet swelling, joint stiffness or pain, rash, change in color of mucus, and fever.    Vital Signs:  Patient profile:   65 year old male Height:      70 inches Weight:      244 pounds BMI:     35.14 O2 Sat:      96 % on Room air Temp:     98.0 degrees F oral Pulse rate:   64 / minute BP sitting:   106 / 58  (right arm) Cuff size:   large  Vitals Entered By: Gweneth Dimitri RN (February 11, 2010 2:30 PM)  Nutrition Counseling: Patient's BMI is greater than 25 and therefore counseled on weight management options.  O2 Flow:  Room air CC: 6 month follow up.  Pt states he had stent placed in Sept.  States he still has SOB with exertion but overall breathing has improved since surgery.  Denies wheezing, chest tightness, cough. Comments Medications reviewed with patient Daytime contact number verified with patient. Gweneth Dimitri RN  February 11, 2010 2:30 PM    Physical Exam  Additional Exam:  Gen: Pleasant, well nourished, in no distress ENT: clear with resolution of purulence Neck: No JVD, no TMG, no carotid bruits Lungs: no use of accessory muscles, no dullness to percussion, clear without rales or rhonchi Cardiovascular: RRR, heart sounds normal, no murmurs or gallops, 1+  peripheral edema, less than prior exam Musculoskeletal: No deformities, no cyanosis or clubbing     Impression & Recommendations:  Problem #  1:  SARCOIDOSIS (ICD-135) Assessment Unchanged  stable sarcoidosis plan cont low dose steroids  Problem # 2:  SLEEP APNEA (ICD-780.57) Assessment: Unchanged  Orders: Est. Patient Level III (57846)  OSA improved with more aggressive therapy plan per Dr Mayford Knife  Medications Added to Medication List This Visit: 1)  Novolog Mix 70/30 70-30 % Susp (Insulin aspart prot & aspart) .... 40 units three times a day 2)  Prednisone 10 Mg Tabs (Prednisone) .... Take 1 tablet by mouth once a day  Complete Medication List: 1)  Glucotrol 10 Mg Tabs (Glipizide) .... Two by mouth two times a day 2)  Losartan Potassium 100 Mg Tabs (Losartan potassium) .... One by mouth daily 3)  Novolog Mix 70/30 70-30 % Susp (Insulin aspart prot & aspart) .... 40 units three times a day 4)  Aspirin 81 Mg Tbec (Aspirin) .... Take one tablet by mouth once daily  5)  Plavix 75 Mg Tabs (Clopidogrel bisulfate) .Marland Kitchen.. 1 by mouth daily 6)  Fish Oil 1000 Mg Caps (Omega-3 fatty acids) .... Take 3 capsule by mouth once a day 7)  Furosemide 40 Mg Tabs (Furosemide) .... 2 tabs by mouth  every morning 8)  Klor-con M10 10 Meq Cr-tabs (Potassium chloride crys cr) .... Take 2 tabs twice daily 9)  Prednisone 10 Mg Tabs (Prednisone) .... Take 1 tablet by mouth once a day 10)  Zetia 10 Mg Tabs (Ezetimibe) .... Once daily 11)  Centrum Cardio Tabs (Multiple vitamins-minerals) .... Take 1 tablet by mouth two times a day 12)  Cpap  .... Use at bedtime  Patient Instructions: 1)  No change in medications 2)  Return in    5      months   Immunization History:  Influenza Immunization History:    Influenza:  historical (11/03/2009)   Appended Document: Pulmonary OV fax randall [facility]

## 2010-04-06 NOTE — Letter (Signed)
Summary: [facility]  [facility]   Imported By: Lester  04/28/2009 09:13:37  _____________________________________________________________________  External Attachment:    Type:   Image     Comment:   External Document

## 2010-04-06 NOTE — Progress Notes (Signed)
Summary: clarrification on furosemide > refills sent to pharmacy  Phone Note Outgoing Call   Call placed by: Gweneth Dimitri RN,  December 01, 2009 10:50 AM Call placed to: Patient Summary of Call: Re[facility] from [facility] Furosemide 40 mg 1 1/2 tabs two times a day.  This rx was last sent on 7.13.11 #180 x 1.  Pt should still have rx left.  Called [facility] to clarrify this.  Spoke wtih Selena Batten.  She states pt also has a current rx for Furosemide 80mg  two times a day by Dr. Anne Fu and was asking for clarrification on which rx pt should be on.  LMOMTCB to clarrify this with pt.  What is he actually taking?   Initial call taken by: Gweneth Dimitri RN,  December 01, 2009 10:53 AM  Follow-up for Phone Call        [facility] Yetta Barre RN  December 01, 2009 4:20 PM   Pt returned call.  He states Dr. Anne Fu put him on furosemide 80mg  two times a day approx 3 month ago d/t "a lot of fluid build up."  States he is currently taking Furosemide 40mg  2 tablets in the morning.  Dr. Delford Field, pls advise what pt should actually be taking and if you are ok with refilling this.  Thanks! Follow-up by: Gweneth Dimitri RN,  December 01, 2009 4:46 PM  Additional Follow-up for Phone Call Additional follow up Details #1::        refill on what he is actually taking now and change med list  Additional Follow-up by: Storm Frisk MD,  December 01, 2009 6:14 PM    Additional Follow-up for Phone Call Additional follow up Details #2::    [id number].  Aundra Millet Reynolds LPN  December 02, 2009 9:10 AM   pt returned call.  verified again the lasix dosing and directions and informed pt that PEW okay for refills.  changed on med list and [facility]ills [facility].  pt verbalized his understanding. Boone Master CNA/MA  December 02, 2009 4:50 PM   New/Updated Medications: FUROSEMIDE 40 MG TABS (FUROSEMIDE) 2 tabs by mouth  every morning Prescriptions: FUROSEMIDE 40 MG TABS (FUROSEMIDE) 2 tabs by mouth  every  morning  #60 x 6   Entered by:   Boone Master CNA/MA   Authorized by:   Storm Frisk MD   Signed by:   Boone Master CNA/MA on 12/02/2009   Method used:   [facility] to        [facility] [phone] (retail)       [address]       [place], [place]  [address]       Ph: [phone]       Fax: [phone]   RxID:   [id number]

## 2010-04-06 NOTE — Letter (Signed)
Summary: Colonoscopy Date Change Letter  [facility]  [address] [place], [place] [address]   Phone: [phone]  Fax: [phone]      November 25, 2009 MRN: [id number]   [name] [address] [place], [place]  [address]   Dear [name],   Previously you were recommended to have a repeat colonoscopy around this time. Your chart was recently reviewed by Dr. Arlyce Dice of [facility]. Follow up colonoscopy is now recommended in September 2016. This revised recommendation is based on current, nationally recognized guidelines for colorectal cancer screening and polyp surveillance. These guidelines are endorsed by the American Cancer Society, [employer] on Colorectal Cancer as well as numerous other major medical organizations.  Please understand that our recommendation assumes that you do not have any new symptoms such as bleeding, a change in bowel habits, anemia, or significant abdominal discomfort. If you do have any concerning GI symptoms or want to discuss the guideline recommendations, please call to arrange an office visit at your earliest convenience. Otherwise we will keep you in our reminder system and contact you 1-2 months prior to the date listed above to schedule your next colonoscopy.  Thank you,   Barbette Hair. Arlyce Dice, M.D.  [facility] [phone]

## 2010-04-06 NOTE — Assessment & Plan Note (Signed)
Summary: Pulmonary OV   Primary Provider/Referring Provider:  Dr. Holley Bouche  CC:  8 wk follow up. states breathing is doing well overall.  having chest tightness and a dry cough x2days.Marland Kitchen  History of Present Illness: Pulmonary OV  This is a 65 -year-old, white male with a history of severe pulmonary sarcoidosis with primary mediastinal and hilar lymph node involvement.  The patient also has elements of parenchymal involvement.  Also severe OSA.  The patient had failed Arava   therapy.  The patient also had adverse side effects Imuran.    Failed Arava and Imuran due to side effects.  January 01, 2009 10:11 AM Pt notes 3 weeks of  fatigue,  more dyspneic,  no cough,  no energy. No chest pain.  Just tight in the chest.  The pt feels full of fluid The pt has had studies per Cardiology that show severe sleep apnea.  [facility] sees the pts. NoCPAP  machine set up as of yet.  Pt with progressive edema and weight gain.  Pt with ongoing dyspnea.  January 22, 2009 11:55 AM Is better.  Had no energy and fatigue.  But overall is better Sleep study 55% at night and AHI>65 Had another cpap titration and three different masks  now is better on cpap now.  and nasal mask at last ov Increase Lasix to 60mg  twice daily Increase Potassium to two twice daily  March 19, 2009 10:39 AM [name] on the ice and hurt L knee.  Stretched ligament on L knee. Pt notes some tightness in chest and dry cough with cold air. Pt notes ongoing fatigue. Pt had recent  cortisone injectinons in back so CBG high.  HgbA1c recently 9.9 There is no mucous production. The pt has low testosterone 142 noted on recent lab. The swelling is better in the lower extremities. The pt is now on cpap with new machine and mask.     Preventive Screening-Counseling & Management  Alcohol-Tobacco     Smoking Status: never  Current Medications (verified): 1)  Glucotrol 10 Mg  Tabs (Glipizide) .... Two By Mouth Two Times A  Day 2)  Benicar 40 Mg Tabs (Olmesartan Medoxomil) .... Take 1 Tablet By Mouth Once A Day 3)  Novolog Mix 70/30 70-30 %  Susp (Insulin Aspart Prot & Aspart) .... 50 Units Three Times A Day 4)  Aspirin 81 Mg Tbec (Aspirin) .... Take One Tablet By Mouth Once Daily 5)  Plavix 75 Mg Tabs (Clopidogrel Bisulfate) .Marland Kitchen.. 1 By Mouth Daily 6)  Fish Oil 1000 Mg Caps (Omega-3 Fatty Acids) .... Take 3 Capsule By Mouth Once A Day 7)  Furosemide 40 Mg Tabs (Furosemide) .Marland Kitchen.. 1 1/2 Tabs Two Times A Day 8)  Klor-Con M10 10 Meq Cr-Tabs (Potassium Chloride Crys Cr) .... Take 2 Tabs Twice Daily 9)  Prednisone 10 Mg  Tabs (Prednisone) .... Take As Directed One Daily 10)  Zetia 10 Mg Tabs (Ezetimibe) .... Once Daily  Allergies (verified): 1)  ! * Hydrocortisone  Past History:  Past medical, surgical, family and social histories (including risk factors) reviewed, and no changes noted (except as noted below).  Past Medical History: Diabetes mellitus, type II GERD Sarcoidosis   Stage IV        -Stable CT Chest 12/10 CAD with Stent in LAD and side branch PTCA 2009 with AMI       Dr Katrinka Blazing Adrenal insufficiency    -cortisol 5.4 2009 Sleep Apnea severe    -CPAP rx per  [facility]  Past Surgical History: Reviewed history from 11/20/2007 and no changes required. Coronary Stent in LAD and side branch PTCA  2009  Past Pulmonary History:  Pulmonary History: CT OF CHEST: IMPRESSION: No substantial change exam.  The soft tissue attenuation in the right infrahilar region with irregularity of the bronchovascular bundles extending into the right middle lobe and right lower lobe has not progressed in the interval and probably represents scarring.  Follow-up imaging in 6-12 months could be used to ensure stability. (02/09/2009 9:09)  Family History: Reviewed history from 04/05/2007 and no changes required. Asthma Kidney Disease:  MI/Heart Attack Sarcoidosis  Social History: Reviewed history from 11/20/2007  and no changes required. 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  The patient denies shortness of breath at rest, productive cough, coughing up blood, chest pain, irregular heartbeats, loss of appetite, weight change, abdominal pain, difficulty swallowing, sore throat, tooth/dental problems, headaches, nasal congestion/difficulty breathing through nose, sneezing, itching, ear ache, anxiety, depression, hand/feet swelling, joint stiffness or pain, rash, change in color of mucus, and fever.    Vital Signs:  Patient profile:   65 year old male Height:      70 inches Weight:      244 pounds BMI:     35.14 O2 Sat:      96 % on Room air Temp:     98.0 degrees F oral Pulse rate:   86 / minute BP sitting:   120 / 72  (left arm) Cuff size:   regular  Vitals Entered By: Gweneth Dimitri RN (March 19, 2009 10:35 AM)  O2 Flow:  Room air CC: 8 wk follow up. states breathing is doing well overall.  having chest tightness and a dry cough x2days. Comments Medications reviewed with patient Gweneth Dimitri RN  March 19, 2009 10:35 AM    Physical Exam  Additional Exam:  Gen: Pleasant, well nourished, in no distress ENT: clear with resolution of purulence Neck: No JVD, no TMG, no carotid bruits Lungs: no use of accessory muscles, no dullness to percussion, clear without rales or rhonchi Cardiovascular: RRR, heart sounds normal, no murmurs or gallops, 1+  peripheral edema, less than prior exam Musculoskeletal: No deformities, no cyanosis or clubbing     Impression & Recommendations:  Problem # 1:  OTHER ACUTE SINUSITIS (ICD-461.8) Assessment Improved Recent acute sinusitis  now resolved plan cont nasal hygiene  Problem # 2:  SLEEP APNEA (ICD-780.57) Assessment: Improved OSA improved with more  aggressive therapy plan per Dr Mayford Knife  Problem # 3:  ASTHMA (ICD-V17.5) Assessment: Unchanged Moderate persistent asthma, stable plan no change in plan of care   Problem # 4:  SARCOIDOSIS (ICD-135) Assessment: Unchanged  stable sarcoidosis, recent CT chest stable plan cont low dose steroids  Medications Added to Medication List This Visit: 1)  Novolog Mix 70/30 70-30 % Susp (Insulin aspart prot & aspart) .... 50 units three times a day 2)  Furosemide 40 Mg Tabs (Furosemide) .Marland Kitchen.. 1 1/2 tabs two times a day 3)  Klor-con M10 10 Meq Cr-tabs (Potassium chloride crys cr) .... Take 2 tabs twice daily 4)  Zetia 10 Mg Tabs (Ezetimibe) .... Once daily  Complete Medication List: 1)  Glucotrol 10 Mg Tabs (Glipizide) .... Two by mouth two times a day 2)  Benicar 40  Mg Tabs (Olmesartan medoxomil) .... Take 1 tablet by mouth once a day 3)  Novolog Mix 70/30 70-30 % Susp (Insulin aspart prot & aspart) .... 50 units three times a day 4)  Aspirin 81 Mg Tbec (Aspirin) .... Take one tablet by mouth once daily 5)  Plavix 75 Mg Tabs (Clopidogrel bisulfate) .Marland Kitchen.. 1 by mouth daily 6)  Fish Oil 1000 Mg Caps (Omega-3 fatty acids) .... Take 3 capsule by mouth once a day 7)  Furosemide 40 Mg Tabs (Furosemide) .Marland Kitchen.. 1 1/2 tabs two times a day 8)  Klor-con M10 10 Meq Cr-tabs (Potassium chloride crys cr) .... Take 2 tabs twice daily 9)  Prednisone 10 Mg Tabs (Prednisone) .... Take as directed one daily 10)  Zetia 10 Mg Tabs (Ezetimibe) .... Once daily  Other Orders: Est. Patient Level III (16109)  Patient Instructions: 1)  No medication changes 2)  Return  3 months [place]  Appended Document: Pulmonary OV fax Holley Bouche

## 2010-04-06 NOTE — Assessment & Plan Note (Signed)
Summary: Pulmonary OV   Primary Provider/Referring Provider:  Dr. Holley Bouche  CC:  Follow up.  States breathing is worse x3-4 wk-relates this to being hot outside.  Having increased SOB when outside and also c/o swelling in BLL x3-4wks.  Denies wheezing, chest tightness, and cough.  .  History of Present Illness: Pulmonary OV  This is a 65 -year-old, white male with a history of severe pulmonary sarcoidosis with primary mediastinal and hilar lymph node involvement.  The patient also has elements of parenchymal involvement.  Also severe OSA.  The patient had failed Arava   therapy.  The patient also had adverse side effects Imuran.    Failed Arava and Imuran due to side effects.  September 03, 2009 11:45 AM This pt had two cataracts removed and two nerve blocks  in lower back.  Now there is no real cough.   There is no real wheeze,  no pndrip,  stays fatigued,  not as dyspneic with exertion.   No other issues. Doing well with cpap that has helped .   Preventive Screening-Counseling & Management  Alcohol-Tobacco     Smoking Status: never     Year Quit: age 54  Current Medications (verified): 1)  Glucotrol 10 Mg  Tabs (Glipizide) .... Two By Mouth Two Times A Day 2)  Benicar 40 Mg Tabs (Olmesartan Medoxomil) .... Take 1 Tablet By Mouth Once A Day 3)  Novolog Mix 70/30 70-30 %  Susp (Insulin Aspart Prot & Aspart) .... 50 Units Three Times A Day 4)  Aspirin 81 Mg Tbec (Aspirin) .... Take One Tablet By Mouth Once Daily 5)  Plavix 75 Mg Tabs (Clopidogrel Bisulfate) .Marland Kitchen.. 1 By Mouth Daily 6)  Fish Oil 1000 Mg Caps (Omega-3 Fatty Acids) .... Take 3 Capsule By Mouth Once A Day 7)  Furosemide 40 Mg Tabs (Furosemide) .Marland Kitchen.. 1 1/2 Tabs Two Times A Day 8)  Klor-Con M10 10 Meq Cr-Tabs (Potassium Chloride Crys Cr) .... Take 2 Tabs Twice Daily 9)  Prednisone 10 Mg  Tabs (Prednisone) .... Take As Directed One Daily 10)  Zetia 10 Mg Tabs (Ezetimibe) .... Once Daily 11)  Centrum Cardio  Tabs (Multiple  Vitamins-Minerals) .... Take 1 Tablet By Mouth Two Times A Day  Allergies (verified): 1)  ! * Hydrocortisone  Past History:  Past medical, surgical, family and social histories (including risk factors) reviewed, and no changes noted (except as noted below).  Past Medical History: Reviewed history from 03/19/2009 and no changes required. Diabetes mellitus, type II GERD Sarcoidosis   Stage IV        -Stable CT Chest 12/10 CAD with Stent in LAD and side branch PTCA 2009 with AMI       Dr Katrinka Blazing Adrenal insufficiency    -cortisol 5.4 2009 Sleep Apnea severe    -CPAP rx per [facility] PCP  Past Surgical History: Coronary Stent in LAD and side branch PTCA  2009 bilateral cataract surgery 2011 Lumbar spine nerve blocks 2011 x 2  Past Pulmonary History:  Pulmonary History: CT OF CHEST: IMPRESSION: No substantial change exam.  The soft tissue attenuation in the right infrahilar region with irregularity of the bronchovascular bundles extending into the right middle lobe and right lower lobe has not progressed in the interval and probably represents scarring.  Follow-up imaging in 6-12 months could be used to ensure stability. (02/09/2009 9:09)  Family History: Reviewed history from 04/05/2007 and no changes required. Asthma Kidney Disease:  MI/Heart Attack Sarcoidosis  Social History: Reviewed  history from 11/20/2007 and no changes required. 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  Distribution manager  desk job  [employer]  Review of Systems       The patient complains of shortness of breath with activity.  The patient denies shortness of breath at rest, productive cough, non-productive cough, coughing up blood, chest pain, irregular heartbeats, acid heartburn, indigestion, loss of appetite, weight change, abdominal pain, difficulty swallowing, sore throat, tooth/dental problems, headaches, nasal congestion/difficulty breathing through nose, sneezing, itching, ear ache, anxiety,  depression, hand/feet swelling, joint stiffness or pain, rash, change in color of mucus, and fever.    Vital Signs:  Patient profile:   65 year old male Height:      70 inches Weight:      249 pounds BMI:     35.86 O2 Sat:      94 % on Room air Temp:     98.2 degrees F oral Pulse rate:   79 / minute BP sitting:   106 / 56  (left arm) Cuff size:   large  Vitals Entered By: Gweneth Dimitri RN (September 03, 2009 11:40 AM)  O2 Flow:  Room air CC: Follow up.  States breathing is worse x3-4 wk-relates this to being hot outside.  Having increased SOB when outside and also c/o swelling in BLL x3-4wks.  Denies wheezing, chest tightness, cough.   Comments Medications reviewed with patient Daytime contact number verified with patient. Gweneth Dimitri RN  September 03, 2009 11:41 AM    Physical Exam  Additional Exam:  Gen: Pleasant, well nourished, in no distress ENT: clear with resolution of purulence Neck: No JVD, no TMG, no carotid bruits Lungs: no use of accessory muscles, no dullness to percussion, clear without rales or rhonchi Cardiovascular: RRR, heart sounds normal, no murmurs or gallops, 1+  peripheral edema, less than prior exam Musculoskeletal: No deformities, no cyanosis or clubbing     Impression & Recommendations:  Problem # 1:  SARCOIDOSIS (ICD-135) Assessment Unchanged  stable sarcoidosis plan cont low dose steroids  Problem # 2:  ASTHMA (ICD-V17.5) Assessment: Unchanged  Moderate persistent asthma, stable plan no change in plan of care   Medications Added to Medication List This Visit: 1)  Losartan Potassium 100 Mg Tabs (Losartan potassium) .... One by mouth daily 2)  Centrum Cardio Tabs (Multiple vitamins-minerals) .... Take 1 tablet by mouth two times a day 3)  Cpap  .... Use at bedtime  Complete Medication List: 1)  Glucotrol 10 Mg Tabs (Glipizide) .... Two by mouth two times a day 2)  Losartan Potassium 100 Mg Tabs (Losartan potassium) .... One by mouth  daily 3)  Novolog Mix 70/30 70-30 % Susp (Insulin aspart prot & aspart) .... 50 units three times a day 4)  Aspirin 81 Mg Tbec (Aspirin) .... Take one tablet by mouth once daily 5)  Plavix 75 Mg Tabs (Clopidogrel bisulfate) .Marland Kitchen.. 1 by mouth daily 6)  Fish Oil 1000 Mg Caps (Omega-3 fatty acids) .... Take 3 capsule by mouth once a day 7)  Furosemide 40 Mg Tabs (Furosemide) .Marland Kitchen.. 1 1/2 tabs two times a day 8)  Klor-con M10 10 Meq Cr-tabs (Potassium chloride crys cr) .... Take 2 tabs twice daily 9)  Prednisone 10 Mg Tabs (Prednisone) .... Take as directed one daily 10)  Zetia 10 Mg Tabs (Ezetimibe) .... Once daily 11)  Centrum Cardio Tabs (Multiple vitamins-minerals) .... Take 1 tablet by mouth two times a day 12)  Cpap  .... Use at bedtime  Other Orders:  Est. Patient Level III (03474)  Patient Instructions: 1)  You can switch to Losartan 100mg  daily and stop Benicar 2)  No change in prednisone 3)  Return 6 months [place]  Prescriptions: LOSARTAN POTASSIUM 100 MG TABS (LOSARTAN POTASSIUM) one by mouth daily  #30 x 6   Entered and Authorized by:   Storm Frisk MD   Signed by:   Storm Frisk MD on 09/03/2009   Method used:   Electronically to        [facility] [phone]* (retail)       [address]       [place], [place]  [address]       Ph: [phone]       Fax: [phone]   RxID:   [id number] PREDNISONE 10 MG  TABS (PREDNISONE) Take as directed one daily  #30 x 6   Entered and Authorized by:   Storm Frisk MD   Signed by:   Storm Frisk MD on 09/03/2009   Method used:   Electronically to        [facility] [phone]* (retail)       [address]       [place], [place]  [address]       Ph: [phone]       Fax: [phone]   RxID:   [id number]

## 2010-05-20 LAB — CBC
HCT: 46.6 % (ref 39.0–52.0)
Hemoglobin: 16.4 g/dL (ref 13.0–17.0)
MCH: 34.7 pg — ABNORMAL HIGH (ref 26.0–34.0)
MCHC: 35.2 g/dL (ref 30.0–36.0)
MCV: 98.7 fL (ref 78.0–100.0)
RBC: 4.72 MIL/uL (ref 4.22–5.81)

## 2010-05-20 LAB — BASIC METABOLIC PANEL
BUN: 17 mg/dL (ref 6–23)
CO2: 33 mEq/L — ABNORMAL HIGH (ref 19–32)
Glucose, Bld: 79 mg/dL (ref 70–99)
Potassium: 3.7 mEq/L (ref 3.5–5.1)
Sodium: 140 mEq/L (ref 135–145)

## 2010-05-20 LAB — GLUCOSE, CAPILLARY
Glucose-Capillary: 171 mg/dL — ABNORMAL HIGH (ref 70–99)
Glucose-Capillary: 174 mg/dL — ABNORMAL HIGH (ref 70–99)
Glucose-Capillary: 78 mg/dL (ref 70–99)

## 2010-05-21 LAB — POCT I-STAT, CHEM 8
Calcium, Ion: 1.11 mmol/L — ABNORMAL LOW (ref 1.12–1.32)
Creatinine, Ser: 1 mg/dL (ref 0.4–1.5)
Glucose, Bld: 124 mg/dL — ABNORMAL HIGH (ref 70–99)
HCT: 43 % (ref 39.0–52.0)
Hemoglobin: 14.6 g/dL (ref 13.0–17.0)
Potassium: 3.3 mEq/L — ABNORMAL LOW (ref 3.5–5.1)
TCO2: 23 mmol/L (ref 0–100)

## 2010-06-14 LAB — GLUCOSE, CAPILLARY
Glucose-Capillary: 111 mg/dL — ABNORMAL HIGH (ref 70–99)
Glucose-Capillary: 177 mg/dL — ABNORMAL HIGH (ref 70–99)
Glucose-Capillary: 217 mg/dL — ABNORMAL HIGH (ref 70–99)
Glucose-Capillary: 252 mg/dL — ABNORMAL HIGH (ref 70–99)
Glucose-Capillary: 260 mg/dL — ABNORMAL HIGH (ref 70–99)
Glucose-Capillary: 319 mg/dL — ABNORMAL HIGH (ref 70–99)
Glucose-Capillary: 432 mg/dL — ABNORMAL HIGH (ref 70–99)
Glucose-Capillary: 112 mg/dL — ABNORMAL HIGH (ref 70–99)
Glucose-Capillary: 198 mg/dL — ABNORMAL HIGH (ref 70–99)
Glucose-Capillary: 260 mg/dL — ABNORMAL HIGH (ref 70–99)
Glucose-Capillary: 266 mg/dL — ABNORMAL HIGH (ref 70–99)
Glucose-Capillary: 278 mg/dL — ABNORMAL HIGH (ref 70–99)
Glucose-Capillary: 312 mg/dL — ABNORMAL HIGH (ref 70–99)
Glucose-Capillary: 345 mg/dL — ABNORMAL HIGH (ref 70–99)
Glucose-Capillary: 385 mg/dL — ABNORMAL HIGH (ref 70–99)
Glucose-Capillary: 462 mg/dL — ABNORMAL HIGH (ref 70–99)

## 2010-06-14 LAB — URINALYSIS, ROUTINE W REFLEX MICROSCOPIC
Glucose, UA: >1000 mg/dL — AB
Hgb urine dipstick: NEGATIVE
Specific Gravity, Urine: 1.029 (ref 1.005–1.030)
pH: 6 (ref 5.0–8.0)

## 2010-06-14 LAB — BASIC METABOLIC PANEL
CO2: 27 mEq/L (ref 19–32)
Calcium: 8.2 mg/dL — ABNORMAL LOW (ref 8.4–10.5)
Calcium: 8.8 mg/dL (ref 8.4–10.5)
Calcium: 9.3 mg/dL (ref 8.4–10.5)
Chloride: 104 mEq/L (ref 96–112)
Creatinine, Ser: 1.02 mg/dL (ref 0.4–1.5)
Creatinine, Ser: 1.04 mg/dL (ref 0.4–1.5)
Creatinine, Ser: 1.11 mg/dL (ref 0.4–1.5)
GFR calc Af Amer: >60 mL/min (ref >60)
GFR calc Af Amer: >60 mL/min (ref >60)
GFR calc Af Amer: >60 mL/min (ref >60)
GFR calc non Af Amer: >60 mL/min (ref >60)
GFR calc non Af Amer: >60 mL/min (ref >60)
GFR calc non Af Amer: >60 mL/min (ref >60)
Glucose, Bld: 153 mg/dL — ABNORMAL HIGH (ref 70–99)
Glucose, Bld: 186 mg/dL — ABNORMAL HIGH (ref 70–99)
Potassium: 5.7 mEq/L — ABNORMAL HIGH (ref 3.5–5.1)
Sodium: 139 mEq/L (ref 135–145)
Sodium: 140 mEq/L (ref 135–145)

## 2010-06-14 LAB — CBC
HCT: 40.3 % (ref 39.0–52.0)
MCHC: 33.5 g/dL (ref 30.0–36.0)
Platelets: 251 10*3/uL (ref 150–400)
Platelets: 262 10*3/uL (ref 150–400)
RBC: 4.17 MIL/uL — ABNORMAL LOW (ref 4.22–5.81)
RDW: 13.1 % (ref 11.5–15.5)
RDW: 13.2 % (ref 11.5–15.5)
WBC: 13.3 10*3/uL — ABNORMAL HIGH (ref 4.0–10.5)

## 2010-06-14 LAB — POTASSIUM: Potassium: 5 mEq/L (ref 3.5–5.1)

## 2010-06-14 LAB — COMPREHENSIVE METABOLIC PANEL
ALT: 22 U/L (ref 0–53)
AST: 24 U/L (ref 0–37)
Albumin: 3 g/dL — ABNORMAL LOW (ref 3.5–5.2)
Alkaline Phosphatase: 44 U/L (ref 39–117)
GFR calc Af Amer: >60 mL/min (ref >60)
Potassium: 4.3 mEq/L (ref 3.5–5.1)
Sodium: 132 mEq/L — ABNORMAL LOW (ref 135–145)
Total Protein: 6.1 g/dL (ref 6.0–8.3)

## 2010-06-14 LAB — APTT: aPTT: 28 seconds (ref 24–37)

## 2010-06-14 LAB — CARDIAC PANEL(CRET KIN+CKTOT+MB+TROPI)
CK, MB: 1.4 ng/mL (ref 0.3–4.0)
CK, MB: 1.5 ng/mL (ref 0.3–4.0)
CK, MB: 1.5 ng/mL (ref 0.3–4.0)
Total CK: 113 U/L (ref 7–232)
Total CK: 116 U/L (ref 7–232)

## 2010-06-14 LAB — POCT I-STAT, CHEM 8
BUN: 22 mg/dL (ref 6–23)
Chloride: 98 mEq/L (ref 96–112)
Creatinine, Ser: 1.2 mg/dL (ref 0.4–1.5)
Potassium: 4.8 mEq/L (ref 3.5–5.1)
Sodium: 132 mEq/L — ABNORMAL LOW (ref 135–145)

## 2010-06-14 LAB — URINE CULTURE
Colony Count: NO GROWTH
Special Requests: NEGATIVE

## 2010-06-14 LAB — TSH: TSH: 0.871 u[IU]/mL (ref 0.350–4.500)

## 2010-06-14 LAB — POCT CARDIAC MARKERS: Myoglobin, poc: 148 ng/mL (ref 12–200)

## 2010-06-14 LAB — URINE MICROSCOPIC-ADD ON

## 2010-06-14 LAB — CK TOTAL AND CKMB (NOT AT ARMC)
CK, MB: 1 ng/mL (ref 0.3–4.0)
Relative Index: INVALID (ref 0.0–2.5)
Total CK: 94 U/L (ref 7–232)

## 2010-06-14 LAB — CULTURE, BLOOD (ROUTINE X 2): Culture: NO GROWTH

## 2010-06-14 LAB — DIFFERENTIAL
Lymphocytes Relative: 3 % — ABNORMAL LOW (ref 12–46)
Lymphs Abs: 0.4 10*3/uL — ABNORMAL LOW (ref 0.7–4.0)
Monocytes Relative: 1 % — ABNORMAL LOW (ref 3–12)
Neutrophils Relative %: 95 % — ABNORMAL HIGH (ref 43–77)

## 2010-06-14 LAB — TROPONIN I: Troponin I: 0.02 ng/mL (ref 0.00–0.06)

## 2010-06-14 LAB — LIPID PANEL: Triglycerides: 205 mg/dL — ABNORMAL HIGH (ref <150)

## 2010-06-14 LAB — HEMOGLOBIN A1C: Mean Plasma Glucose: 303 mg/dL

## 2010-06-14 LAB — D-DIMER, QUANTITATIVE: D-Dimer, Quant: 0.75 ug/mL-FEU — ABNORMAL HIGH (ref 0.00–0.48)

## 2010-07-20 NOTE — Consult Note (Signed)
[name], [name] NO.:  [id number]   MEDICAL RECORD NO.:  [id number]          PATIENT TYPE:  INP   LOCATION:  4711                         FACILITY:  [facility]   PHYSICIAN:  Craig Evener, MD  DATE OF BIRTH:  Mar 15, 1945   DATE OF CONSULTATION:  DATE OF DISCHARGE:                                 CONSULTATION   CONSULTING MD:  Dr. Felipa [name] of [facility].   PRIMARY CARE MD:  Craig Bouche, MD   CARDIOLOGY:  Craig Bathe, MD   CHIEF COMPLAINT:  Fever, malaise, cough, and shortness of breath.   HISTORY OF PRESENT ILLNESS:  [name] is a pleasant 65 year old white  male with known history of sarcoidosis who presented to the [facility]  Emergency Room on August 14, 2008.  He presented on June 10, with a 2-day  of malaise, cough, shortness of breath, fever, and pleuritic chest pain  with nausea and vomiting.  He was admitted by [facility] with  pneumonia and dehydration, and therapy was initiated with Rocephin and  azithromycin and Pulmonary was consulted on June 11, for pneumonia in  the setting of sarcoidosis.  The patient had recently seen Dr. Shan [name] in the pulmonary office one week ago as an outpatient, at that  time, his prednisone was increased to 10 mg p.o. daily.   PAST MEDICAL/SURGICAL HISTORY:  1. Coronary artery disease.  2. Diabetes mellitus.  3. Hypertension.  4. Sarcoidosis.  5. Hyperlipidemia.  6. Sleep apnea.  7. Statin intolerance.  8. Drug-eluting stents in his left anterior descending that were      placed in October 2009.   SOCIAL HISTORY:  [name] lives in [place] with his wife.  He has 3  adult children.  He works as a Merchandiser, retail.  Reports never smoking.  No  alcohol, and no illicit drug use.   FAMILY HISTORY:  He reports his mother and father are both living and  his mother has diabetes mellitus and his father has coronary artery  disease.   MEDICATIONS:  1. Benicar 40 mg p.o. daily.  2. Glipizide 20 mg  p.o. b.i.d.  3. Novolin 70/30, 40 units b.i.d.  4. Prednisone 10 mg p.o. daily.  5. Plavix 75 mg p.o. daily.  6. Aspirin 81 mg p.o. daily.  7. Bystolic 10 mg p.o. daily.  8. Lasix 40 mg p.o. daily.  9. Trilipix 185 mg p.o. daily.   REVIEW OF SYSTEMS:  Code status at this time, the patient requested to  be a full code.  GENERAL:  Reports fevers and malaise.  HEENT:  Denies  headache, nasal discharge, nosebleeds, voice changes, and vertigo.  SKIN:  He denies rashes or lesions.  CV/PULM:  The patient reports 1  month history of breathing difficulties to include shortness of breath  and pleuritic chest pain and overall sense of feeling bad.  He had seen  Dr. Delford [name] approximately 1 week prior to admission.  At that time, his  prednisone was increased.  He reports shortness of breath and pleuritic  chest pain with  a dry nonproductive cough.  Reports occasional wheezing  and edema at the bilateral lower extremities.  Denied palpitations or  syncope.  GU:  Denies frequency, urgency, dysuria, and hematuria.  GI:  Reports nausea and vomiting for 2 days prior to admission and loose  stool.  Denies abdominal pain.  Denies heat or cold intolerance or skin  or hair changes.  All other systems reviewed and are negative.   PHYSICAL EXAMINATION:  VITAL SIGNS:  Temperature 98.2, blood pressure  122/62, heart rate 101, respirations 20, and sat 95% on room air.  GENERAL:  [name] is a well-developed adult male in no acute distress.  NEUROLOGIC:  He is awake, alert, and oriented.  Cranial nerves II  through XII are grossly intact.  Strength is equal in all extremities.  Speech is clear.  HEENT:  Normocephalic and atraumatic.  Pupils are equal, round, and  reactive.  EOMs are intact.  Mucous membranes are moist and pink.  NECK:  Supple without lymphadenopathy and no JVD.  CARDIOVASCULAR:  S1 and S2, regular rate and rhythm with no murmurs,  rubs, or gallops.  No carotid bruits.  Pulses are +2 and equal   bilaterally.  PULMONARY:  Respirations are even and nonlabored.  Lungs bilaterally,  diminished to posterior bases without wheezing.  GI:  Abdomen is soft and nontender.  Bowel sounds x4, active, no  organomegaly noted.  MUSCULOSKELETAL:  Moving all extremities well.  No bone or joint  deformities noted.  No spine or CVA tenderness.  SKIN:  No rashes or lesions identified.  Skin is pink, warm, and dry.  Bilateral lower extremities are dry and flaky.   RADIOLOGIC DATA:  CT of the chest on June 11, was negative for pulmonary  embolus that reveals right basilar atelectasis versus infiltrate.   EKG; on June 11, EKG reveals normal sinus rhythm.   LABORATORY DATA:  Sodium of 132, potassium 4.3, chloride 100,  bicarbonate 25, BUN of 21, creatinine 1.22, and a glucose of 390.  CBC  reveals WBC of 11.3, platelets 251, hemoglobin 12.7, and hematocrit  37.5.   MICROBIOLOGY DATA:  Blood cultures x2 are pending as well as sputum  cultures are pending.   ASSESSMENT AND PLAN:  Pneumonia in the setting of sarcoidosis, may also  be component of worsening sarcoid given recent history of increased  shortness of breath and need for increase in steroid maintenance dose.  No obvious pseudomonal risk at this time when considering antibiotics.  Recommendations to continue with azithromycin and  Rocephin for cap coverage.  We will wait culture data and narrow  antibiotics as appropriate.  Pulmonary toileting will be encouraged via  flutter valve.  At this time, we will continue steroids at 50 mg daily  and consider increasing the dose and taper.  Followup chest x-ray in the  a.m.      Canary Brim, NP      Marius Ditch Jefm Miles, MD  Electronically Signed    BO/MEDQ  D:  08/15/2008  T:  08/16/2008  Job:  [id number]

## 2010-07-20 NOTE — Discharge Summary (Signed)
Craig, [name] NO.:  [id number]   MEDICAL RECORD NO.:  [id number]          PATIENT TYPE:  OUT   LOCATION:  VASC                         FACILITY:  [facility]   PHYSICIAN:  Jake Bathe, MD      DATE OF BIRTH:  [birth year]   DATE OF ADMISSION:  12/15/2007  DATE OF DISCHARGE:  12/15/2007                               DISCHARGE SUMMARY   DISCHARGE DIAGNOSES:  1. Coronary artery disease status post drug-eluting stent to the left      anterior descending/first diagonal bifurcation.  2. Sarcoidosis.  3. Dyspnea.  4. Diabetes.  5. STATIN INTOLERANCE.  6. Hyperlipidemia.  7. Sleep apnea.  8. Family history of coronary artery disease.   HOSPITAL COURSE:  [name] is a 65 year old male patient who had been  experiencing exertional chest discomfort, lasted about 10 minutes in  duration along with shortness of breath and weakness.  His story was  concerning for angina given his history of diabetes and family history  of coronary artery disease.  Dr. Anne Fu felt it was necessary to go  ahead and take him to the Catheterization Lab without performing a  stress test first.  He was found to have 70% LAD stenosis at the first  diagonal, 90% stenosis.  A drug-eluting stent was implanted to this area  without difficulty by Dr. Katrinka Blazing on November 15, 2007.  The patient  remained in the hospital overnight and was ready for discharge to home  the following day.   Lab studies showed sodium 135, potassium 3.7, BUN 8, creatinine 0.88,  hemoglobin 12.1, hematocrit 35.2, white count 9.3, platelets 254.   DISCHARGE MEDICATIONS:  1. Plavix 75 mg a day.  2. Norvasc 5 mg a day.  3. Lopressor 25 mg twice a day.  4. Benicar 20 mg a day.  5. Enteric-coated aspirin 325 mg a day.  6. Glipizide twice a day as before.  7. Prednisone 5 mg a day.  8. Novolin insulin as prior to admission.  9. Fish oil daily.  10.Aleve only as needed.  11.Nitroglycerin p.r.n. chest pain.   Clean cath  site gently with soap and water.  No scrubbing.  Follow up  with Dr. Anne Fu on December 30, 2007, at 1:30 p.m.  Increase activity  slowly.  No driving for 2 days.  No lifting over 10 pounds for 1 week.      Guy Franco, P.A.      Jake Bathe, MD  Electronically Signed    LB/MEDQ  D:  12/18/2007  T:  12/19/2007  Job:  [id number]

## 2010-07-20 NOTE — H&P (Signed)
[name], [name] NO.:  [id number]   MEDICAL RECORD NO.:  [id number]          PATIENT TYPE:  INP   LOCATION:  2038                         FACILITY:  [facility]   PHYSICIAN:  Jake Bathe, MD      DATE OF BIRTH:  1945/04/27   DATE OF ADMISSION:  11/26/2007  DATE OF DISCHARGE:                              HISTORY & PHYSICAL   PRIMARY CARE PHYSICIAN:  Holley Bouche, MD   PULMONOLOGIST:  Charlcie Cradle. Delford Field, MD, FCCP   CHIEF COMPLAINT:  Sweating, feeling poorly, and right lower quadrant  discomfort.   HISTORY OF PRESENT ILLNESS:  A 65 year old male with coronary artery  disease, status post recent LAD, PCI at a bifurcation site with  subsequent ballooning of his large first diagonal branch with diabetes,  obesity, pulmonary sarcoidosis, here with diaphoresis, feeling poorly,  and the right lower quadrant discomfort.  After his stent placement, he  subsequently had right lower quadrant discomfort and was scanned to rule  out retroperitoneal bleed.  He did not have any evidence of  retroperitoneal bleed; however, he did have some hemorrhage, small  amount stranding in the internal oblique muscle adjacent to the  catheterization site, which could have been causing his discomfort.  He  continues to have sharp discomfort with motion that is periodic.  It is  not constant.  He does have a small amount of ecchymosis around the cath  site.   In regards to his diaphoresis, his blood sugars have been fluctuating  from the 40s to 170s and he had just decreased his prednisone dosage to  5 mg every other day from 5 mg every day.  He has not decreased his  insulin dosage nor his glipizide, which he takes 4 tablets a day of 10  mg.  His insulin dosage at home was 45 units of NovoLog 70/30 twice a  day.   He denies any chest pain although he did have some questionable  palpitations surrounding this incident at 1:45 a.m. where he awakened,  very diaphoretic, just feeling  poorly.  He did not check his blood sugar  at that time.   He denies any evidence of hematochezia cough, fevers, chills, nausea,  chest pain, or syncope.   Currently while lying in bed here in the emergency department, he is  feeling better and has just received a CT of his chest, abdomen, and  pelvis.  No evidence of PE, dissection, or worsening bleeding in his  right lower quadrant.  His hemorrhages resolving.  He does, however,  have a right middle lobe consolidation, which may represent infection or  changes with his pulmonary sarcoid.  His pulmonary sarcoid nodules are  stable.   PAST MEDICAL HISTORY:  1. Coronary artery disease - status post percutaneous intervention to      LAD at bifurcation of first diagonal branch with subsequent PTCA of      this first diagonal branch by Dr. Katrinka Blazing approximately 10 days ago.  2. Obesity.  3. Diabetes mellitus with fluctuating blood sugars.  4. Pulmonary sarcoid - followed  by Dr. Delford Field recently decreased his      prednisone to 5 mg every other day.  5. Right lower quadrant pain - status post appendectomy several years      ago - felt to be secondary to small amount of hemorrhage around the      cath site.  6. Weakness.   ALLERGIES:  No known drug allergies.   MEDICATIONS AT HOME:  1. Benicar 20 mg once a day.  2. Glipizide 10 mg 4 tablets a day, which I have decreased to twice      daily.  3. Metoprolol 25 mg twice a day.  4. Novolin 70/30 45 units twice a day which I have decreased to 25      units twice a day.  5. Prednisone 5 mg every other day.  6. Plavix 75 mg a day.  7. Aspirin 325 mg a day.  8. Simvastatin 40 mg at night.  9. Amlodipine 5 mg once a day.   FAMILY HISTORY:  Currently noncontributory.   SOCIAL HISTORY:  Denies any smoking, significant alcohol use.  He is  currently married.   REVIEW OF SYSTEMS:  Unless specified above, all other 12 review of  systems negative.   PHYSICAL EXAM:  VITAL SIGNS:  Blood  pressure 135/75, pulse 76,  respirations 18, temperature 96.7.  GENERAL:  Alert and oriented x3 in no acute distress, pleasant with his  wife at bedside.  HEENT:  Eyes, well-perfused conjunctivae, EOMI, no scleral icterus.  NECK:  Thick.  No carotid bruits.  No JVD.  CARDIOVASCULAR:  Regular rate and rhythm with no murmurs, rubs, or  gallops appreciated.  LUNGS:  Clear to auscultation bilaterally.  I do not appreciate any  crackles or wheezes especially in the right middle lobe.  ABDOMEN:  Obese.  Positive bowel sounds.  Mild ecchymosis noted at the  right lower quadrant at cath site in his right inguinal area.  Nontender.  No masses noted.  EXTREMITIES:  Trace edema bilateral lower extremities with normal distal  pulses.  SKIN:  Warm, dry, and intact.  No rashes noted, ecchymosis at cath site  noted, small.  NEUROLOGIC:  Nonfocal.  Did not assess gait.  No tremors noted.   LABS:  CT of chest showing right middle lobe consolidation, possible  infection, no evidence of PE.  CT of abdomen showing resolving  hemorrhage in the right inguinal region, otherwise normal.  D-dimer was  mildly elevated at 0.6 prompting CT of chest.  White count mildly  elevated at 11.4 with a hemoglobin of 13.6, hematocrit 40, platelets  330, neutrophil count slightly elevated at 8.9.  Sodium 136, potassium  3.3, BUN 11, creatinine 0.9, CO2 31, glucose 176.  LFTs were all within  normal limits.  EKG personally viewed showed sinus rhythm, left axis  deviation, no significant change from prior ECG.   ASSESSMENT AND PLAN:  1. Right lower quadrant discomfort - he is status post appendectomy.      Likely still secondary to small amount of hemorrhage/stranding in      the right inguinal region.  It is periodic, it is sharp and      stabbing in quality.  We will continue to follow.  We will give no      anticoagulation at this time.  CT of pelvis did not show any      infectious etiology.  No evidence of bleeding  from lower valve      region.  2. Weakness/diaphoresis -  likely secondary to hypoglycemia.  His blood      sugars have been fluctuating as low as 40s to 170s.  He has still      been taking the same amount of insulin/glipizide despite his      decrease in prednisone to 5 mg every other day.  He has felt poor      in the past similar to this when he has low blood sugar.      Nonetheless, I will make sure that there is no cardiac etiology and      we will check cardiac biomarkers every 8 hours x3.  Now that his      blood sugar is back into the normal range, he does feel better.  3. Right middle lobe consolidation - possible pneumonia.  I will place      him on Avelox x7 days.  Certainly if he has a developing pneumonia,      this could cause him to feel fatigued and worsened.  However, he      does not complain of increasing cough or fever, chills.  He does      have underlying pulmonary sarcoidosis.  4. Pulmonary sarcoidosis - we will continue prednisone every other      day.  5. Obesity - encourage weight loss.  6. Secondary prevention - we will continue his statin, Plavix,      aspirin, and metoprolol.  We will monitor for any signs of any      changes.      Jake Bathe, MD  Electronically Signed     MCS/MEDQ  D:  11/26/2007  T:  11/26/2007  Job:  [id number]   cc:   Holley Bouche, M.D.

## 2010-07-20 NOTE — Assessment & Plan Note (Signed)
[facility]                             PULMONARY OFFICE NOTE   LAMONE, FERRELLI                         MRN:          [id number]  DATE:11/29/2006                            DOB:          Aug 06, 1945    This is a 65 year old white male history of diabetes type 2,  sarcoidosis.  His shortness of breath is better with his recent pulse of  prednisone but he did develop increased degrees of hyperglycemia while  on higher dose prednisone.  He is now back down to 15 mg a day on  prednisone.   EXAM:  Temp 98, blood pressure 160/80, pulse 82, saturation 95% on room  air.   IMPRESSION:  Sarcoidosis refractory to corticosteroids.   PLAN:  Begin leflunomide, which is Arava, at 100 mg x3 days and 20 mg a  day thereafter as a steroid-sparing agent.  We will obtain baseline  phosphorus, CBC and liver function profile today and monthly.  We will  see the patient back in 1 month's time to assess response.  In the  interim, the patient's prednisone will be tapered down to 5 mg a day and  hold at that level.     Charlcie Cradle Delford Field, MD, [facility]  Electronically Signed    PEW/MedQ  DD: 11/29/2006  DT: 11/30/2006  Job #: [id number]   cc:   Holley Bouche, M.D.

## 2010-07-20 NOTE — Consult Note (Signed)
[name], [name] NO.:  [id number]   MEDICAL RECORD NO.:  [id number]          PATIENT TYPE:  INP   LOCATION:  4711                         FACILITY:  [facility]   PHYSICIAN:  Jake Bathe, MD      DATE OF BIRTH:  09/22/1945   DATE OF CONSULTATION:  08/20/2008  DATE OF DISCHARGE:                                 CONSULTATION   REFERRING PHYSICIAN:  Charlcie Cradle. Delford Field, MD, FCCP, of Pulmonary.   REASON FOR CONSULTATION:  Evaluation of bradycardia.   HISTORY OF PRESENT ILLNESS:  A 65 year old male with coronary artery  disease, diabetes, hypertension, prior pulmonary sarcoidosis, sleep  apnea, status post drug-eluting stent to the LAD in October 2009 who was  admitted with a chief complaint of fevers.  Just prior to his admission  on August 13, 2008, he was seen by me in my office and was noted to have  significant bradycardia with heart rates in the 40s, pulse of 48 was  noted during vital signs and ECG showed sinus bradycardia, rate 56.  It  was noted at that time that he was taking both metoprolol succinate 100  mg once a day as well as Bystolic 10 mg once a day inadvertently.  My  instructions to him several months ago were to discontinue the  metoprolol; however, he was taking both of them at the same time.  This  was thought to be the major inciting factor to the bradycardia.  The  metoprolol was stopped on August 13, 2008.  During this hospitalization,  however, it is noteworthy he has had sinus bradycardia in the early  morning hours from 6:00 a.m. to 7:00 a.m. with heart rates as low as the  upper 30s.  He was on Bystolic 10 mg during this hospitalization.  The  reason for hospitalization as stated above was fevers quite significant  and treatment of pneumonia.  He is relatively asymptomatic during these  bradycardic episodes and denies any chest pain, nausea, shortness of  breath, and presyncope.   PAST MEDICAL HISTORY:  As stated above.   ALLERGIES:  No known  drug allergies.   MEDICATIONS:  Medications on H and P have been reviewed, most notably:  1. Bystolic 10 mg once a day.  2. Plavix 75 mg once a day.  3. Aspirin 325 mg once a day.  4. Trilipix 135 mg once a day.  5. Lasix 40 mg once a day.  6. Benicar 40 mg once a day.  7. Glipizide.  8. Novolin 40 units twice a day.  9. Fish oil 1000 mg once a day.   SOCIAL HISTORY:  Denies any smoking or alcohol use.   FAMILY HISTORY:  Currently noncontributory.   REVIEW OF SYSTEMS:  Unless specified above all other review of systems  negative.  No bleeding.  No syncope.   PHYSICAL EXAMINATION:  VITAL SIGNS:  Telemetry noted to have sinus  bradycardia in the 40s, otherwise afebrile.  Blood pressures in the 130  systolic, respirations 16, and saturating 99% on room air.  GENERAL:  Alert and oriented x3 in no acute distress, pleasant,  comfortable, sitting up in bed.  HEENT:  Eyes, well-perfused conjunctivae.  EOMI.  No scleral icterus.  CARDIOVASCULAR:  Regular rate and rhythm without any appreciable  murmurs, rubs, or gallops.  Normal PMI.  LUNGS:  Clear to auscultation bilaterally with mild left lower lobe  rales.  Normal respiratory effort noted.  ABDOMEN:  Soft and nontender.  Normoactive bowel sounds.  Obese.  No  hepatosplenomegaly.  No bruits.  EXTREMITIES:  No clubbing, cyanosis, or edema.  SKIN:  Warm, dry, and intact with ruddiness noted over chest wall.  NEUROLOGIC:  Nonfocal.  Normal gait.  PSYCHIATRIC:  Normal affect.   LABORATORY DATA:  Sodium 139, potassium 5.7, BUN 14, and creatinine 1.4.  Blood cultures, no growth to date.  White count 8.5, hemoglobin 11.9,  hematocrit 35.7, and platelets 227.  Total cholesterol 205, HDL 18, and  LDL 147.  INR 1.1.  D-dimer 0.75 elevated.  TSH was normal at 0.871.  Chest x-ray from June 12 demonstrates stable right middle lobe patchy  opacity, stable coarse perihilar interstitial opacities.  CT angiogram  of chest was performed which  showed no evidence of pulmonary embolism,  right basilar atelectasis versus infiltrate.  Prior office notes  reviewed.   ASSESSMENT AND PLAN:  A 65 year old male with sarcoidosis, hypertension,  hyperlipidemia, obesity, coronary artery disease status post drug-  eluting stent to left anterior descending with mildly symptomatic  bradycardia in the early morning hours.  1. Bradycardia - originally, I thought this was secondary to      administration of both metoprolol and Bystolic concomitantly in      that he failed to discontinue his metoprolol as directed.  Despite      the discontinuation of metoprolol over the last few days, he still      has remaining bradycardia which is sinus bradycardia in the early      morning hours.  I agree with Dr. Delford Field in that decreasing the      Bystolic from 10 mg to 5 mg once a day is reasonable.  If this does      not resolve the bradycardia, I would discontinue Bystolic      altogether.  One must also entertain the thought of possible      cardiac sarcoid in that infiltration of the AV node or the SA node      can possibly cause sinus bradycardia to occur, although this may      not be likely.  The discussion of pacemaker has briefly been had      with the patient and hopefully we will able to avoid this by      discontinuation of sinoatrial/atrioventricular nodal blocking      agents.  Watch blood pressure given the discontinuation of these      medications.  2. Coronary artery disease - stable.  Continue Plavix and aspirin.  No      symptoms.  I doubt that this is related to any signs of ischemia.      He is having no chest discomfort.  3. Hyperlipidemia - noted low HDL.  Continue with Trilipix.  LDL not      at goal of 70.  He is being seen currently by Alfonse Ras, PharmD      at [facility].  His triglycerides in April were 698.  Much      improved on Trilipix.  He may also need addition of  statin therapy,      although he had had history  of statin intolerance in the past.  4. Obesity - continue with with and exercise.  5. Pneumonia - currently being treated by both Dr. Rito Ehrlich and Dr.      Delford Field.   Please call with any questions.  I will be happy to follow along with  you during this hospitalization.      Jake Bathe, MD  Electronically Signed     MCS/MEDQ  D:  08/20/2008  T:  08/21/2008  Job:  [id number]

## 2010-07-20 NOTE — H&P (Signed)
NAMEGEORGE, [name] NO.:  [id number]   MEDICAL RECORD NO.:  [id number]          PATIENT TYPE:  EMS   LOCATION:  MAJO                         FACILITY:  [facility]   PHYSICIAN:  Michiel Cowboy, MDDATE OF BIRTH:  [birth year]   DATE OF ADMISSION:  08/14/2008  DATE OF DISCHARGE:                              HISTORY & PHYSICAL   PRIMARY CARE [place]:  Holley Bouche, M.D.   CARDIOLOGIST:  Jake Bathe, M.D.   PULMONOLOGIST:  Charlaine Dalton. Sherene Sires, MD, FCCP.   Patient is a 65 year old gentleman with coronary artery disease and  sarcoidosis who presents with 2 days of malaise, fever, cough, shortness  of breath, intermittent chest pain, nausea, and vomiting.  The fever was  up to 104 at home.  When he continued to have fever and was nauseous,  his wife finally brought him into the emergency department.  He denies  any abdominal pain, rather just some shortness of breath and fleeting  chest pain.  He states that he never really had angina, so he does not  know what to compare it to.  Otherwise, he has not been around anyone  sick lately.  He is worried this is possibly related to sarcoidosis.  Otherwise, review of systems is unremarkable.  No diarrhea.  No  constipation.  No wheezing.  No orthopnea or PND.   PAST MEDICAL HISTORY:  1. Coronary artery disease.  2. Diabetes.  3. Hypertension.  4. Sarcoidosis.  5. Status post stenting with drug-eluting stent of the left anterior      descending  bifurcation in October, 2009.  6. History of hyperlipidemia.  7. Sleep apnea.  8. STATIN intolerance.   SOCIAL HISTORY:  Patient lives with spouse.  Never smoked.  Does not  drink.  Does not use drugs.   FAMILY HISTORY:  Significant for father with coronary artery disease at  advanced age.  Mother with diabetes.   ALLERGIES:  No known drug allergies.   CURRENT MEDICATIONS:  1. Benicar 40 mg p.o. daily.  2. Glipizide 20 mg p.o. b.i.d.  3. Novolin 70/30 40 units p.o.  b.i.d.  4. Prednisone 50 mg daily.  5. Plavix 75 mg daily.  6. Aspirin 81 mg daily.  7. Bystolic 10 mg daily.  8. Lasix 40 mg daily.  9. Trilipix 185 mg daily.   PHYSICAL EXAMINATION:  VITALS:  Temperature 100.1, blood pressure  141/73, pulse 80, respirations 18, satting 93% on 2 liters.  Patient appears to be in no acute distress.  Head nontraumatic.  Somewhat dryish mucous membranes and slightly  decreased skin turgor.  LUNGS:  Occasional mild crackles but no wheezes.  Good air movement.  HEART:  Regular rate and rhythm.  No murmurs appreciated.  ABDOMEN:  Soft, nontender, nondistended.  LOWER EXTREMITIES:  No clubbing, cyanosis or edema.  SKIN:  Fairly dry with scales, otherwise unremarkable.  NEUROLOGIC:  Patient is intact.  NECK:  Supple.   LABS:  White blood cell count 15.3, hemoglobin 14.6.  Sodium 132,  potassium 4.8, creatinine 1.2.  Cardiac enzymes negative.  EKG showing no ischemic changes.  Left anterior fascicular block.   Chest x-ray showing sarcoidosis versus pneumonia.   UA not obtained.   ASSESSMENT/PLAN:  1. This is a 65 year old gentleman with a history of sarcoidosis who      presents with fever, etiology possible pneumonia, although      sarcoidosis also may appear similarly and especially compared to      the last CT scan the patient had, could be possibly related to this      finding.  2. Fever:  Will treat for now as the patient has pneumonia.  Give      Rocephin as well as azithromycin.  Will obtain UA and urine      culture.  Obtain blood cultures, sputum culture.  3. History of coronary artery disease with fleeting chest pain.  Will      cycle cardiac markers.  Do serial EKG.  Check fasting lipid panel.      Since patient is complaining of shortness of breath and chest pain,      will check a D-dimer, although less likely.  4. Dehydration, clinically.  Will give IV fluids and for orthostatics.  5. Diabetes:  While in-house, will change to Lantus  60 units and      sliding scale.  Hold glipizide.  6. History of hypertension:  Continue Benicar plus Bystolic, if      holding parameters.  7. History of sarcoidosis.  Continue prednisone for now.  Consider      pulmonology consult to see if these symptoms could be potentially      consistent, I doubt it.  Not sure about presentation or fever with      sarcoid exacerbation.  8. Prophylaxis:  Protonix plus Lovenox.      Michiel Cowboy, MD  Electronically Signed    AVD/MEDQ  D:  08/15/2008  T:  08/15/2008  Job:  [id number]   cc:   Holley Bouche, M.D.  Jake Bathe, MD  Charlaine Dalton. Sherene Sires, MD, FCCP

## 2010-07-20 NOTE — Assessment & Plan Note (Signed)
[facility]                             PULMONARY OFFICE NOTE   KAISER, BELLUOMINI                         MRN:          [id number]  DATE:01/11/2007                            DOB:          1945-03-10    Mr. Trejos returns in follow up with underlying severe sarcoidosis. We  attempted to begin Arava on this patient at 20 mg daily. In September,  he did well for two weeks and then developed blisters in the mouth,  nausea, and tongue blisters. He was no longer able to tolerate the Arava  and it was stopped. He maintains prednisone 10 mg daily   PHYSICAL EXAMINATION:  VITAL SIGNS:  Temperature 98, blood pressure  140/68, pulse 81, saturation 95% on room air.  CHEST:  Clear without evidence of wheeze, rale, or rhonchi.  CARDIAC:  Regular rate and rhythm without S3, normal S1 and S2. No S3 or  S4.  ABDOMEN:  Soft and nontender.  EXTREMITIES:  No edema or clubbing.  SKIN:  Clear.   IMPRESSION:  Sarcoidosis with adverse reaction to the [place]. Ranae Plumber has  been discontinued and we will maintain prednisone, but taper slow to 5  mg daily and hold. He will return in two months.     Charlcie Cradle Delford Field, MD, [facility]  Electronically Signed    PEW/MedQ  DD: 01/11/2007  DT: 01/12/2007  Job #: [id number]   cc:   Holley Bouche, M.D.

## 2010-07-20 NOTE — Discharge Summary (Signed)
[name], [name] NO.:  [id number]   MEDICAL RECORD NO.:  [id number]          PATIENT TYPE:  INP   LOCATION:  2038                         FACILITY:  [facility]   PHYSICIAN:  Jake Bathe, MD      DATE OF BIRTH:  November 11, 1945   DATE OF ADMISSION:  11/26/2007  DATE OF DISCHARGE:  11/27/2007                               DISCHARGE SUMMARY   DISCHARGE DIAGNOSES:  1. Right lower quadrant sharp pain, resolved.  2. Diaphoresis.  3. Known coronary artery disease, recent drug-eluting stent to the      left anterior descending (coronary artery) and angioplasty to the      first diagonal.  4. Diabetes mellitus.  5. Hypercholesterolemia, treated (intolerance to statins).  6. Sarcoidosis.  7. Shortness of breath.  8. Carotid bruit with negative Doppler.  9. Sleep apnea.   HOSPITAL COURSE:  [name] is a 65 year old male patient who recently had  coronary interventions as described above.  He, after his intervention  in early September 2009, had right lower quadrant pain that showed no  retroperitoneal hemorrhage, but there was a possible right-sided  abdominal wall or mesenteric hemorrhage.  This did show some stranding  and was felt to be secondary to his percutaneous intervention to the  right groin cath site.   On the day of admission along with right lower quadrant pain, he did  have some diaphoresis.  A repeat CT of the abdomen showed an improving  area of stranding, improved hemorrhage.  Because of his recent chest  discomfort, a D-dimer was performed and was elevated.  CT of his chest  showed no pulmonary embolism.   LABORATORY STUDIES:  Point-of-care markers negative x2.  Cardiac enzymes  normal.  Hemoglobin 13.4, hematocrit 39.7, white count 8.3, and  platelets 310.  Sodium 137, potassium 4.4, BUN 10, and creatinine 0.99.   The patient remained in the hospital overnight and felt better.  It was  noted that his blood sugars ranged anywhere from 40 to around  160, and  it was thought that his diaphoresis episodes were secondary to  hypoglycemic attacks.   At this time, we are referring him back to Dr. Tiburcio Pea for medical  management of his diabetes.  He has an appointment with Dr. Anne Fu this  Thursday  and we will keep this appointment.   He is to remain on a moderate carbohydrate diabetic diet, heart healthy.  He is to increase his activity slowly.   DISCHARGE MEDICATIONS:  1. Plavix 75 mg a day.  2. Enteric-coated aspirin 325 mg a day.  3. Simvastatin 40 mg p.o. nightly, the patient has agreed to try this      even though he has had statin intolerance in the past.  4. Norvasc 5 mg a day.  5. Metoprolol 25 mg twice a day.  6. Novolin insulin decreased to 30 units twice a day.  7. Glipizide decreased to 10 mg twice a day.  8. Prednisone 5 mg every other day.  9. Benicar 20 mg a day.  10.Avelox 400 mg  a day for 6 days.   Please note on his chest CT, there was finding of diffuse lung changes  particularly in the right middle lobe, and there was a concern that this  could be early pneumonia; therefore, we went ahead and prophylactically  treated him for this.   The patient is discharged to home in stable and improved condition.      Guy Franco, P.A.      Jake Bathe, MD  Electronically Signed    LB/MEDQ  D:  11/27/2007  T:  11/28/2007  Job:  [id number]   cc:   Holley Bouche, M.D.  Jake Bathe, MD

## 2010-07-20 NOTE — Assessment & Plan Note (Signed)
[facility]                             PULMONARY OFFICE NOTE   YUVIN, BUSSIERE                         MRN:          [id number]  DATE:10/24/2006                            DOB:          [birth year]    This is a 65 year old male with sarcoidosis, mediastinal involvement,  noting increased fatigue, no cough.  Blood sugars are up and down.  Maintains prednisone 5 mg daily.   Other maintenance medicines are listed in the chart, correct as  reviewed.  Benicar is at 20 mg daily.  AcipHex is now off.   EXAM:  Temperature 97, blood pressure 138/80, pulse 54, saturation 95%  on room air.  CHEST:  Showed diminished breath sounds without evidence of wheeze or  rhonchi.  CARDIAC EXAM:  Showed a regular rate and rhythm without S3.   LABORATORY DATA:  Spirometry showed moderate restrictive defect,  unchanged from previous values.   IMPRESSION:  Sarcoidosis, mediastinal involvement.   PLAN:  To repeat CT scanning of the chest to re-evaluate lung parenchyma  and mediastinal adenopathy.     Charlcie Cradle Delford Field, MD, [facility]  Electronically Signed    PEW/MedQ  DD: 10/24/2006  DT: 10/25/2006  Job #: [id number]   cc:   Holley Bouche, M.D.

## 2010-07-20 NOTE — Discharge Summary (Signed)
[name], [name] NO.:  [id number]   MEDICAL RECORD NO.:  [id number]          PATIENT TYPE:  INP   LOCATION:  4711                         FACILITY:  [facility]   PHYSICIAN:  Hollice Espy, M.D.DATE OF BIRTH:  [birth year]   DATE OF ADMISSION:  08/14/2008  DATE OF DISCHARGE:  08/22/2008                               DISCHARGE SUMMARY   PRIMARY CARE PHYSICIAN:  Holley Bouche, M.D.   CONSULTANTS:  1. Charlcie Cradle Delford Field, MD, [facility], Pulmonary Critical Care Medicine.  2. Jake Bathe, M.D., [facility].   DISCHARGE DIAGNOSES:  1. Community-acquired pneumonia.  2. History of sarcoidosis.  3. Bradycardia secondary to medications.  4. Elevated blood pressure.  5. Uncontrolled diabetes mellitus.  6. Obesity.  7. Hyperlipidemia.  8. History of coronary artery disease.   DISCHARGE MEDICATIONS:  New medicines:  1. Levaquin 750 mg p.o. daily x4 days.  2. Norvasc 5 mg p.o. daily.   Changes in medicines:  The patient had previously recently stopped his  metoprolol two days prior to admission secondary to bradycardia.  He  will continue to hold metoprolol.  He will also stop Bystolic as well.  He will also increase his 70/30 insulin from 40 units to 50 units q.12  h.   The patient will continue on the following previous medications.  1. Aspirin 81 p.o. daily.  2. Glipizide 20 mg p.o. b.i.d.  3. Fish oil 1000 p.o. daily.  4. Benicar 40 p.o. daily.  5. Plavix 75 p.o. daily.  6. Lasix 40 p.o. daily.  7. Prednisone 15 mg p.o. daily.  8. K-Dur 10 mg p.o. daily.  9. Triplex 185 p.o. daily.   HOSPITAL COURSE:  The patient is a 65 year old white male with past  medical history of CAD, sarcoidosis, diabetes mellitus who presented  with increasing cough and shortness of breath.  Initially was felt that  perhaps he had sarcoid flare-up.  Pulmonary Critical Care Medicine was  consulted.  In regards to his community-acquired pneumonia, the patient  was put on  increasing dose of steroids.  He was evaluated and felt more  based on his x-rays and his clinical presentation that this was just a  straightforward community-acquired pneumonia.  He tolerated IV  antibiotics.  These were changed over to p.o. Levaquin after his  breathing was improved.  He is being discharged to home, and the plan  will be for the patient to continue 4 more days for a total of 10 days  of therapy.  Follow up with Dr. Danise Mina.  The patient's steroids were  initially started at an elevated dose as high as 40, and this was  tapered down.  Currently he is on 15 p.o. daily.  At normal baseline, he  is on anywhere from 5-10.  He had been tolerating 15, and he will  continue on 15 until he follows up with Dr. Delford Field.  In regards to his  diabetes mellitus, this was poorly controlled.  An A1c reflected at  13.3.  He normally is on 40 units of insulin 70/30 twice a  day plus 20  of the glipizide twice a day.  He was put on Lantus and required  increasing doses.  Sugars climbed as high as 400 but with the high  steroids, additional doses were required.  The patient is being  discharged.  His baseline 70/30 is going to be increased to 50 units  twice a day.  He will continue on glipizide.  He will follow up with his  PCP, Dr. Leonides Sake, in 2 weeks for further adjustments.  In regards  to his bradycardia, his metoprolol had already been stopped.  He  continued to remain bradycardic, with a heart rate anywhere from the 30s  to the 50s.  Dr. Anne Fu from cardiology was consulted.  The patient was  discontinued on his Bystolic.  He was continued on his other CAD  medications.  He tolerated this well.  His heart rate climbed up into  the 60s.  He had one brief episode of heart rate at 40 on the night  prior to discharge.  However, his overall heart rate improved up into  the 60s and he is felt to be medically stable.  Norvasc was added  because of the patient's elevated blood pressure  by stopping his  Bystolic and metoprolol.  The patient's overall disposition is improved.   DISCHARGE DIET:  Heart-healthy, carb modified diet.   He is being discharged to home.   He will see Dr. Anne Fu of [facility] in 2 weeks, Dr. Leonides Sake  in 2 weeks and Dr. Danise Mina next week.      Hollice Espy, M.D.  Electronically Signed     SKK/MEDQ  D:  08/22/2008  T:  08/22/2008  Job:  [id number]   cc:   Jake Bathe, MD  Charlcie Cradle Delford Field, MD, [facility]  Holley Bouche, M.D.

## 2010-07-20 NOTE — Cardiovascular Report (Signed)
NAMEMENG, WINTERTON NO.:  [id number]   MEDICAL RECORD NO.:  [id number]          PATIENT TYPE:  INP   LOCATION:  6527                         FACILITY:  [facility]   PHYSICIAN:  Lyn Records, M.D.   DATE OF BIRTH:  25-Mar-1945   DATE OF PROCEDURE:  11/15/2007  DATE OF DISCHARGE:                            CARDIAC CATHETERIZATION   INDICATIONS FOR PROCEDURE:  Class II angina, history of diabetes, and  catheter today performed by Dr. Anne Fu demonstrating a bifurcation  LAD/diagonal lesion with 90% ostial diagonal stenosis and 60% LAD.   PROCEDURE PERFORMED:  Drug-eluting stent in LAD, cutting balloon  angioplasty followed by kissing balloon on the LAD/diagonal bifurcation.   DESCRIPTION:  Reviewed the pictures with Dr. Anne Fu.  Spoke with the  patient and family concerning PCI and attendant risks particularly as it  pertains to side branch loss and restenosis.   We proceeded using Integrilin and heparin as antithrombotic therapy.  ACT was documented to be greater than 250 seconds.  We upgraded to a 7-  [place] CLS left coronary guide system.  We used a [employer] and Public relations account executive in the diagonal and LAD respectively.  We performed cutting balloon  angioplasty on the ostium of the diagonal using a 3.0 x 10 mm long  cutting balloon to 8 atmospheres for 90 seconds.  We then noticed that  there was shift of plaque into the LAD, but not greater than 80%  stenosis in the LAD just beyond the bifurcation.  We then stented the  LAD across the diagonal using a 2.75 x 15 mm Promus balloon.  Stent  deployment pressure was 8 atmospheres.  Two balloon inflations were  performed.  We then using a third wire crossed through the stent back  into the diagonal and retrieved the initial diagonal wire which was a  Forensic psychologist as well.  We then placed a 3.5 x 8 mm [place] Voyager balloon and  dilated the proximal half of the stent over the [employer] wire to 12  atmospheres for 20 seconds.  We got a  good stent expansion.  We then  performed a final kissing balloon angioplasty with an 8-mm long x 3.0-mm  apex balloon in the diagonal and the same balloon specifications in the  LAD with both balloons being inflated to 6 atmospheres for a total of 20  seconds.  The patient tolerated the procedure without complications.  The procedure was then terminated with a good final result.   Angio-Seal was used for hemostasis with good result.   CONCLUSION:  Successful LAD/diagonal bifurcation lesion treatment  reducing a 60% and 90% stenosis in the LAD/diagonal to 0% and less than  40% with TIMI grade III flow.   PLAN:  Aspirin and Plavix x1 year.  Further management per Dr. Anne Fu.      Lyn Records, M.D.  Electronically Signed     HWS/MEDQ  D:  11/15/2007  T:  11/16/2007  Job:  [id number]   cc:   Holley Bouche, M.D.  Jake Bathe, MD

## 2010-07-23 NOTE — Assessment & Plan Note (Signed)
[facility]                             PULMONARY OFFICE NOTE   DICK, HARK                         MRN:          [id number]  DATE:10/27/2006                            DOB:          08-Jan-1946    Mr. Belter is a 65 year old white male. He has a history of sarcoidosis and  a CT scan today is showing increased nodular infiltrates. On this basis,  prednisone will be adjusted to 40 mg daily tapering down by 10 mg every  5 days until he gets to 15 mg daily and holds. We will see him in  September for follow up.     Charlcie Cradle Delford Field, MD, [facility]  Electronically Signed    PEW/MedQ  DD: 10/27/2006  DT: 10/29/2006  Job #: [id number]   cc:   Holley Bouche, M.D.

## 2010-07-23 NOTE — Assessment & Plan Note (Signed)
[facility]                             PULMONARY OFFICE NOTE   BUFFORD, HELMS                         MRN:          [id number]  DATE:06/20/2006                            DOB:          [birth year]    HISTORY OF PRESENT ILLNESS:  The patient is a 65 year old white male  patient of Dr. Lynelle Doctor who has a known history of sarcoidosis and with  mediastinal adenopathy and cyclical cough.  The patient presents today  for an acute office visit complaining of a one week history of increased  productive with thick yellow-green sputum, shortness of breath, wheezing  and fever, chills and sweats.  The patient reports he was seen by his  primary care physician one week ago and prescribed Levaquin for 7 days.  Symptoms improved mildly and the patient was able to leave for a planned  trip to [facility].  However, shortly after arriving at the beach,  symptoms returned and worsened.  The patient was seen at [facility]  clinic 3 days ago.  At that time, a chest x-ray was reported to be  without any acute infiltrates.  The patient was informed to finish his  Levaquin, which he has now finished a 7 day course 2 days ago.  The  patient complains he still has productive cough with thick green sputum,  shortness of breath and wheezing.  The patient denies any hemoptysis,  orthopnea, paroxysmal nocturnal dyspnea or leg swelling.  The patient  has recently had a cyclical cough, which was felt to be exacerbated by  ACE inhibitor.  He had been changed off his ACE inhibitor over to  Benicar.  The patient was much improved and was actually down to 5 mg of  Prednisone daily and doing quite well up until this past week.   PAST MEDICAL HISTORY:  Reviewed.   CURRENT MEDICATIONS:  Reviewed.   PHYSICAL EXAMINATION:  The patient is a pleasant male in no acute  distress.  He is afebrile with stable vital signs.  O2 saturations 94% on room air.  HEENT:  Posterior pharynx is  clear.  NECK:  Supple without cervical adenopathy.  No jugular venous  distension.  LUNGS:  Sounds reveal coarse rhonchi bilaterally with a few expiratory  wheezes.  CARDIAC:  Regular rate and rhythm.  ABDOMEN:  Soft and nontender.  EXTREMITIES:  Warm without any calf tenderness, cyanosis, clubbing or  edema.   IMPRESSION AND PLAN:  A slow to resolve asthmatic bronchitic  exacerbation in a patient who has underlying sarcoidosis and tendencies  towards cyclical cough.  The patient will begin a 7 day course of  Avelox.  Add in Mucinex DM twice daily.  The patient will also increase  his Prednisone up to 20 mg, decreasing by 5 mg every 2 days until he  reaches his baseline of 5 mg daily.  The patient is a diabetic and has  been recommended to increase his insulin up 10 units if blood sugars  become greater than 200.  The patient is to call the office if blood  sugar  is greater than 300.  The patient also will use AcipHex 20 mg  daily for any residual reflux that could be  irritating the airways over the next week and will return back here in  one week with Dr. Delford Field or sooner if needed.      Rubye Oaks, NP  Electronically Signed      Charlcie Cradle Delford Field, MD, [facility]  Electronically Signed   TP/MedQ  DD: 06/21/2006  DT: 06/21/2006  Job #: [id number]

## 2010-07-23 NOTE — Assessment & Plan Note (Signed)
[facility]                             PULMONARY OFFICE NOTE   Craig, [name]                         MRN:          [id number]  DATE:05/31/2006                            DOB:          04/08/1945    [name] is a 65 year old white male, history of sarcoidosis, mediastinal  adenopathy.  He recently had a cyclic cough with upper respiratory tract  infection, hoarseness exacerbated by Lisinopril use.  He is now on  Benicar 20 mg a day, noting some dizziness with this, and is off  Lisinopril.  He is down to 5 mg a day on the prednisone.  Overall, his  cough is markedly better off Lisinopril.   EXAMINATION:  Temperature 98.7.  Blood pressure 134/82.  Pulse 87.  Saturation 96% room air.  CHEST:  Showed to be clear without evidence of wheeze or rhonchi.  CARDIAC EXAM:  Showed a regular rate and rhythm without S3.  Normal S1  and S2.  ABDOMEN:  Soft and non-tender.  EXTREMITIES:  Showed no edema or clubbing.  SKIN:  Was clear.   IMPRESSION:  Cyclic cough with associated sarcoidosis, mediastinal  adenopathy, and dizziness due to Benicar.   PLAN:  Reduce Benicar to 10 mg a day, which is 1/2 of a 20-mg tablet  daily.  Maintain prednisone 5 mg a day.  We will see the patient back in  followup in 3 months.     Charlcie Cradle Delford Field, MD, [facility]  Electronically Signed    PEW/MedQ  DD: 05/31/2006  DT: 05/31/2006  Job #: [id number]   cc:   Holley Bouche, M.D.

## 2010-07-23 NOTE — Assessment & Plan Note (Signed)
[facility]                             PULMONARY OFFICE NOTE   [name], [name]                         MRN:          [id number]  DATE:06/27/2006                            DOB:          [birth year]    HISTORY OF PRESENT ILLNESS:  The patient is a 65 year old, white male  patient of Dr. Lynelle Doctor with a known history of sarcoidosis with  mediastinal adenopathy and a cyclical cough who returns to the office  for followup of a recent asthmatic bronchitic exacerbation. The patient  was seen last visit with persistent cough and congestion. He was given a  7-day course of Avelox and prednisone was increased up to 20 mg and  AcipHex was added in to his current regimen for any possible reflux that  could be irritating the upper airways. The patient returns today  reporting that he has substantially improved with decreased cough and  congestion. He is currently on prednisone 10 mg and has finished Avelox.  The patient denies any hemoptysis, orthopnea, PND or leg swelling.   PAST MEDICAL HISTORY:  Reviewed.   CURRENT MEDICATIONS:  Reviewed.   PHYSICAL EXAMINATION:  GENERAL:  The patient is a pleasant male in no  acute distress.  VITAL SIGNS:  He is afebrile with stable vital signs. O2 saturation  is  95% on room air  HEENT:  He has nasal mucosa with some mild erythema. Nontender sinuses.  Posterior pharynx is clear.  NECK:  Supple without adenopathy. No JVD.  LUNGS:  Lung sounds a few rhonchi. Substantially improved since last  visit.  CARDIAC:  Regular rate.  ABDOMEN:  Soft and nontender.  EXTREMITIES:  Warm without any edema.   IMPRESSION/PLAN:  Recent asthmatic bronchitic exacerbation in a patent  with underlying sarcoidosis and tendencies towards cyclical cough. The  patient has much improved after antibiotics and prednisone bursts. He  will taper back down to 5 mg of prednisone daily which is his baseline.  The patient will also continue on  AcipHex daily for probable reflux that  could be  exacerbating his airways during flareups. The patient will follow back  up with Dr. Delford [name] in 1 month as scheduled or sooner if needed.     Rubye Oaks, NP  Electronically Signed      Craig Cradle Delford Field, MD, [facility]  Electronically Signed   TP/MedQ  DD: 06/27/2006  DT: 06/27/2006  Job #: [id number]

## 2010-07-23 NOTE — Assessment & Plan Note (Signed)
[facility]                               PULMONARY OFFICE NOTE   TREVAUN, RENDLEMAN                         MRN:          [id number]  DATE:09/21/2005                            DOB:          04/13/1945    This is a 65 year old white male with history of sarcoidosis with  mediastinal involvement and airway involvement. However he stopped the  Advair because of nausea back in June.  Nausea has now resolved.  He is down  to Prednisone 5 mg a day and is having no significant dyspnea or cough.   EXAM:  Temperature is 97, blood pressure 124/80, pulse 72, saturation 99% on  room air.  CHEST:  Completely clear without evidence of adventitious breath sounds.  There was no evidence of any wheeze, rhonchi or rales.  CARDIAC EXAM:  Showed a regular rate and rhythm without S3, normal S1, S2.  ABDOMEN:  Soft, nontender.  EXTREMITIES:  Showed no edema or clubbing.  SKIN:  Clear.  NEUROLOGIC EXAM:  Intact.  HEENT EXAM:  Showed no jugular venous distension. No lymphadenopathy.  Oropharynx clear. Neck supple.   IMPRESSION:  Sarcoidosis with airway inflammation, stable at this time.   PLAN:  Maintain Prednisone 5 mg daily and discontinue further Advair. Will  see the patient back in return follow-up.                                   Charlcie Cradle Delford Field, MD, [facility]P   PEW/MedQ  DD:  09/21/2005  DT:  09/21/2005  Job #:  [id number]   cc:   Holley Bouche, MD

## 2010-07-23 NOTE — Assessment & Plan Note (Signed)
[facility]                             PULMONARY OFFICE NOTE   Craig, [name]                         MRN:          [id number]  DATE:05/01/2006                            DOB:          September 04, 1945    [name] returns today in followup.  This is a 65 year old white male  with sarcoidosis, type 2 diabetes.  Had upper respiratory tract  infection earlier in the month and this took a  2 week course of  Augmentin which he just completed on April 24, 2006.  He is still  having a dry cough with coughing paroxysms, significant hoarseness.  The  Symbicort caused upper airway irritation, therefore it was stopped.   MEDICATIONS:  1. Prednisone at 5 mg daily.  2. Lantus 50 units nightly.  3. Tylenol 1 b.i.d.  4. Lisinopril 10 mg daily.  5  Glucotrol 20 mg b.i.d.   EXAMINATION:  VITAL SIGNS:  Temperature 97.8, blood pressure 148/88,  pulse 69, saturation 95% on room air.  CHEST:  Showed diminished breath sounds, prolonged expiratory phase.  No  wheeze or rhonchi were noted.  CARDIAC:  Showed a regular rate and rhythm without S3.  Normal S1 and  S2.  ABDOMEN:  Soft, nontender.  EXTREMITIES:  Showed no edema or clubbing.  SKIN:  Clear.   IMPRESSION:  Cyclic cough, aggravated by recent upper respiratory tract  infection and aggravated by ACE inhibitor use. Underlying stable  sarcoidosis.   PLAN:  For the patient to maintain prednisone 5 mg daily.  Discontinue  further Symbicort.  Discontinue Lisinopril and begin Benicar 20 mg daily  and utilize Tussionex 5 cc b.i.d. p.r.n. cough and administer  doxycycline 100 mg b.i.d. for 7 days.  We will see the patient back in  follow up in a month's time.     Charlcie Cradle Delford Field, MD, [facility]  Electronically Signed    PEW/MedQ  DD: 05/01/2006  DT: 05/01/2006  Job #: [id number]   cc:   Holley Bouche, M.D.

## 2010-07-23 NOTE — Assessment & Plan Note (Signed)
 [facility]                             PULMONARY OFFICE NOTE   TYLOR, COURTWRIGHT                         MRN:          [id number]  DATE:03/01/2006                            DOB:          May 03, 1945    [name] is 65 year old white male.  History of diabetes type 2,  sarcoidosis.  Patient notes increased shortness of breath, wheezing and  cough over the last several months after having done fairly well over  the summer.   He is on:  1. Prednisone now 5 mg daily.  2. Tylenol 2 twice daily.  3. Lisinopril 10 mg daily.  4. Glucotrol 10 mg 2 b.i.d.  5. Lantus 50 units nightly.   EXAMINATION:  Temperature 97.  Blood pressure 134/82.  Pulse 69.  Saturation 97% on room air.  CHEST:  Showed distant breath sounds with a few expiratory wheezes.  CARDIAC EXAM:  Showed a regular rate and rhythm without S3.  Normal S1  and S2.  ABDOMEN:  Soft and non-tender.  EXTREMITIES:  Showed no edema, clubbing or venous disease.  SKIN:  Was clear.   IMPRESSION:  Sarcoidosis with lower airway inflammation.   PLAN:  The patient is to obtain a CT scan of the chest and full  pulmonary function studies to reassess the patient's airways.  We will  maintain prednisone in the interim at 5 mg daily.     Craig Cradle Delford Field, MD, [facility]  Electronically Signed    PEW/MedQ  DD: 03/01/2006  DT: 03/01/2006  Job #: [id number]   cc:   Holley Bouche, M.D.

## 2010-09-15 ENCOUNTER — Other Ambulatory Visit: Payer: Self-pay | Admitting: Critical Care Medicine

## 2010-09-28 ENCOUNTER — Encounter: Payer: Self-pay | Admitting: Critical Care Medicine

## 2010-09-30 ENCOUNTER — Encounter: Payer: Self-pay | Admitting: Critical Care Medicine

## 2010-09-30 ENCOUNTER — Ambulatory Visit (INDEPENDENT_AMBULATORY_CARE_PROVIDER_SITE_OTHER): Payer: BC Managed Care – PPO | Admitting: Critical Care Medicine

## 2010-09-30 DIAGNOSIS — D869 Sarcoidosis, unspecified: Secondary | ICD-10-CM

## 2010-09-30 NOTE — Patient Instructions (Signed)
No change in medications. Return in        6 months        

## 2010-09-30 NOTE — Progress Notes (Signed)
Subjective:    Patient ID: [name], male    DOB: [birth year], 65 y.o.   MRN: [id number]  HPI  This is a 64 -year-old, white male with a history of severe pulmonary sarcoidosis with primary mediastinal and hilar lymph node involvement. The patient also has elements of parenchymal involvement. Also severe OSA. The patient had failed Arava therapy. The patient also had adverse side effects Imuran.  Failed Arava and Imuran due to side effects.  September 03, 2009 11:45 AM  This pt had two cataracts removed and two nerve blocks in lower back.  Now there is no real cough. There is no real wheeze, no pndrip, stays fatigued, not as dyspneic with exertion. No other issues. Doing well with cpap that has helped .  February 11, 2010 2:34 PM  Pt notes since 09/03/09 had more dyspnea , cardiology saw and had to put in another stent in back of LV.  also went to cardiac rehab and this has helped. Sats are checked. No cough or mucus. Dyspnea is at baseline.  Pt denies any significant sore throat, nasal congestion or excess secretions, fever, chills, sweats, unintended weight loss, pleurtic or exertional chest pain, orthopnea PND, or leg swelling  Pt denies any increase in rescue therapy over baseline, denies waking up needing it or having any early am or nocturnal exacerbations of coughing/wheezing/or dyspnea.   09/30/2010  Since last ov doing better.  Cardiac rehab has helped.  No real cough.  No wheezing.  No chest pain. Pt denies any significant sore throat, nasal congestion or excess secretions, fever, chills, sweats, unintended weight loss, pleurtic or exertional chest pain, orthopnea PND, or leg swelling Pt denies any increase in rescue therapy over baseline, denies waking up needing it or having any early am or nocturnal exacerbations of coughing/wheezing/or dyspnea. Pt also denies any obvious fluctuation in symptoms with  weather or environmental change or other alleviating or aggravating factors   Past  Medical History  Diagnosis Date  . Type 2 diabetes mellitus   . GERD (gastroesophageal reflux disease)   . Sarcoid   . Coronary artery disease   . Adrenal insufficiency   . OSA (obstructive sleep apnea)      Family History  Problem Relation Age of Onset  . Asthma    . Kidney disease    . Heart attack    . Sarcoidosis       History   Social History  . Marital Status: Married    Spouse Name: N/A    Number of Children: N/A  . Years of Education: N/A   Occupational History  . 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 Smoking status: Never Smoker   . Smokeless tobacco: Never Used  . Alcohol Use: Not on file  . Drug Use: Not on file  . Sexually Active: Not on file   Other Topics Concern  . Not on file   Social History Narrative  . No narrative on file     Allergies  Allergen Reactions  . Hydrocortisone     REACTION: Increased appetite and leg pain     Outpatient Prescriptions Prior to Visit  Medication Sig Dispense Refill  . aspirin 81 MG tablet Take 81 mg by mouth daily.        . clopidogrel (PLAVIX) 75 MG tablet Take 75 mg by mouth daily.        Marland Kitchen ezetimibe (ZETIA) 10 MG tablet Take 10 mg  by mouth daily.        . fish oil-omega-3 fatty acids 1000 MG capsule Take 3 capsules by mouth daily.        . furosemide (LASIX) 40 MG tablet Take 80 mg by mouth daily.        Marland Kitchen glipiZIDE (GLUCOTROL) 10 MG tablet Take 20 mg by mouth 2 (two) times daily.        . insulin aspart protamine-insulin aspart (NOVOLOG 70/30) (70-30) 100 UNIT/ML injection Inject 40 Units into the skin 3 (three) times daily.        Marland Kitchen losartan (COZAAR) 100 MG tablet TAKE 1 TABLET BY MOUTH EVERY DAY  30 tablet  0  . Multiple Vitamin (MULTIVITAMIN) capsule Take 1 capsule by mouth daily.        . potassium chloride (KLOR-CON) 10 MEQ CR tablet Take 20 mEq by mouth 2 (two) times daily.        . predniSONE (DELTASONE) 10 MG tablet Take 10 mg by mouth daily.           Review  of Systems Constitutional:   No  weight loss, night sweats,  Fevers, chills, fatigue, lassitude. HEENT:   No headaches,  Difficulty swallowing,  Tooth/dental problems,  Sore throat,                No sneezing, itching, ear ache, nasal congestion, post nasal drip,   CV:  No chest pain,  Orthopnea, PND, swelling in lower extremities, anasarca, dizziness, palpitations  GI  No heartburn, indigestion, abdominal pain, nausea, vomiting, diarrhea, change in bowel habits, loss of appetite  Resp: No shortness of breath with exertion or at rest.  No excess mucus, no productive cough,  No non-productive cough,  No coughing up of blood.  No change in color of mucus.  No wheezing.  No chest wall deformity  Skin: no rash or lesions.  GU: no dysuria, change in color of urine, no urgency or frequency.  No flank pain.  MS:  No joint pain or swelling.  No decreased range of motion.  No back pain.  Psych:  No change in mood or affect. No depression or anxiety.  No memory loss.      Objective:   Physical Exam     Filed Vitals:   09/30/10 0904  BP: 120/60  Pulse: 55  Temp: 97.9 F (36.6 C)  TempSrc: Oral  Height: 5' 10.5" (1.791 m)  Weight: 249 lb (112.946 kg)  SpO2: 95%    Gen: Pleasant, cushingoid facies , in no distress,  normal affect  ENT: No lesions,  mouth clear,  oropharynx clear, no postnasal drip  Neck: No JVD, no TMG, no carotid bruits  Lungs: No use of accessory muscles, no dullness to percussion, distant BS Cardiovascular: RRR, heart sounds normal, no murmur or gallops, no peripheral edema  Abdomen: soft and NT, no HSM,  BS normal  Musculoskeletal: No deformities, no cyanosis or clubbing  Neuro: alert, non focal  Skin: Warm, no lesions or rashes CT Chest 2010 IMPRESSION:  No substantial change exam. The soft tissue attenuation in the  right infrahilar region with irregularity of the bronchovascular  bundles extending into the right middle lobe and right lower lobe    has not progressed in the interval and probably represents  scarring. Follow-up imaging in 6-12 months could be used to ensure  stability.  PFTs 2010 PFT Conversion 01/04/2009  FVC 2.42  FVC PREDICT 4.54  FVC  % Predicted 53  FEV1  2.03  FEV1 PREDICT 3.16  FEV % Predicted 64  FEV1/FVC 83.9  FEV1/FVC PRE 120  FeF 25-75 2.82  FeF 25-75 % Predicted 2.97  FEF % EXPEC 94   Assessment & Plan:   Sarcoidosis Stable sarcoidosis Plan Cont pred @10mg /d No other changes     Updated Medication List Outpatient Encounter Prescriptions as of 09/30/2010  Medication Sig Dispense Refill  . aspirin 81 MG tablet Take 81 mg by mouth daily.        . clopidogrel (PLAVIX) 75 MG tablet Take 75 mg by mouth daily.        Marland Kitchen ezetimibe (ZETIA) 10 MG tablet Take 10 mg by mouth daily.        . fish oil-omega-3 fatty acids 1000 MG capsule Take 3 capsules by mouth daily.        . furosemide (LASIX) 40 MG tablet Take 80 mg by mouth daily.        Marland Kitchen glipiZIDE (GLUCOTROL) 10 MG tablet Take 20 mg by mouth 2 (two) times daily.        . insulin aspart protamine-insulin aspart (NOVOLOG 70/30) (70-30) 100 UNIT/ML injection Inject 40 Units into the skin 3 (three) times daily.        Marland Kitchen losartan (COZAAR) 100 MG tablet TAKE 1 TABLET BY MOUTH EVERY DAY  30 tablet  0  . Multiple Vitamin (MULTIVITAMIN) capsule Take 1 capsule by mouth daily.        . potassium chloride (KLOR-CON) 10 MEQ CR tablet Take 20 mEq by mouth 2 (two) times daily.        . predniSONE (DELTASONE) 10 MG tablet Take 10 mg by mouth daily.

## 2010-10-02 NOTE — Assessment & Plan Note (Signed)
Stable sarcoidosis Plan Cont pred @10mg /d No other changes

## 2010-10-04 ENCOUNTER — Other Ambulatory Visit: Payer: Self-pay | Admitting: Critical Care Medicine

## 2010-10-28 ENCOUNTER — Other Ambulatory Visit: Payer: Self-pay | Admitting: Critical Care Medicine

## 2010-11-08 ENCOUNTER — Other Ambulatory Visit: Payer: Self-pay

## 2010-11-08 ENCOUNTER — Emergency Department (INDEPENDENT_AMBULATORY_CARE_PROVIDER_SITE_OTHER): Payer: BC Managed Care – PPO

## 2010-11-08 ENCOUNTER — Encounter (HOSPITAL_BASED_OUTPATIENT_CLINIC_OR_DEPARTMENT_OTHER): Payer: Self-pay | Admitting: *Deleted

## 2010-11-08 ENCOUNTER — Emergency Department (HOSPITAL_BASED_OUTPATIENT_CLINIC_OR_DEPARTMENT_OTHER)
Admission: EM | Admit: 2010-11-08 | Discharge: 2010-11-09 | Disposition: A | Discharge status: Other Acute Inpatient Hospital | Payer: BC Managed Care – PPO | Source: Home / Self Care | Attending: Emergency Medicine | Admitting: Emergency Medicine

## 2010-11-08 DIAGNOSIS — R079 Chest pain, unspecified: Secondary | ICD-10-CM | POA: Insufficient documentation

## 2010-11-08 DIAGNOSIS — R0609 Other forms of dyspnea: Secondary | ICD-10-CM | POA: Insufficient documentation

## 2010-11-08 DIAGNOSIS — K219 Gastro-esophageal reflux disease without esophagitis: Secondary | ICD-10-CM | POA: Insufficient documentation

## 2010-11-08 DIAGNOSIS — I10 Essential (primary) hypertension: Secondary | ICD-10-CM | POA: Insufficient documentation

## 2010-11-08 DIAGNOSIS — R0989 Other specified symptoms and signs involving the circulatory and respiratory systems: Secondary | ICD-10-CM | POA: Insufficient documentation

## 2010-11-08 DIAGNOSIS — G4733 Obstructive sleep apnea (adult) (pediatric): Secondary | ICD-10-CM | POA: Insufficient documentation

## 2010-11-08 DIAGNOSIS — Z9861 Coronary angioplasty status: Secondary | ICD-10-CM | POA: Insufficient documentation

## 2010-11-08 DIAGNOSIS — E785 Hyperlipidemia, unspecified: Secondary | ICD-10-CM | POA: Insufficient documentation

## 2010-11-08 DIAGNOSIS — I5030 Unspecified diastolic (congestive) heart failure: Secondary | ICD-10-CM | POA: Insufficient documentation

## 2010-11-08 DIAGNOSIS — E119 Type 2 diabetes mellitus without complications: Secondary | ICD-10-CM | POA: Insufficient documentation

## 2010-11-08 DIAGNOSIS — R0602 Shortness of breath: Secondary | ICD-10-CM | POA: Insufficient documentation

## 2010-11-08 DIAGNOSIS — Z79899 Other long term (current) drug therapy: Secondary | ICD-10-CM | POA: Insufficient documentation

## 2010-11-08 DIAGNOSIS — R109 Unspecified abdominal pain: Secondary | ICD-10-CM | POA: Insufficient documentation

## 2010-11-08 DIAGNOSIS — D869 Sarcoidosis, unspecified: Secondary | ICD-10-CM

## 2010-11-08 DIAGNOSIS — E669 Obesity, unspecified: Secondary | ICD-10-CM | POA: Insufficient documentation

## 2010-11-08 DIAGNOSIS — I2510 Atherosclerotic heart disease of native coronary artery without angina pectoris: Secondary | ICD-10-CM | POA: Insufficient documentation

## 2010-11-08 LAB — CK TOTAL AND CKMB (NOT AT ARMC)
CK, MB: 3.1 ng/mL (ref 0.3–4.0)
Relative Index: 3 — ABNORMAL HIGH (ref 0.0–2.5)
Relative Index: INVALID (ref 0.0–2.5)
Total CK: 104 U/L (ref 7–232)

## 2010-11-08 LAB — CBC
HCT: 43.3 % (ref 39.0–52.0)
MCH: 32.6 pg (ref 26.0–34.0)
MCHC: 34.6 g/dL (ref 30.0–36.0)
RDW: 13.3 % (ref 11.5–15.5)

## 2010-11-08 LAB — TROPONIN I
Troponin I: <0.3 ng/mL (ref <0.30)
Troponin I: <0.3 ng/mL (ref <0.30)

## 2010-11-08 LAB — COMPREHENSIVE METABOLIC PANEL
AST: 31 U/L (ref 0–37)
Albumin: 3.6 g/dL (ref 3.5–5.2)
BUN: 16 mg/dL (ref 6–23)
Calcium: 8.9 mg/dL (ref 8.4–10.5)
Chloride: 100 mEq/L (ref 96–112)
Creatinine, Ser: 1 mg/dL (ref 0.50–1.35)
Total Protein: 7 g/dL (ref 6.0–8.3)

## 2010-11-08 LAB — DIFFERENTIAL
Basophils Absolute: 0 10*3/uL (ref 0.0–0.1)
Basophils Relative: 0 % (ref 0–1)
Eosinophils Absolute: 0.4 10*3/uL (ref 0.0–0.7)
Lymphocytes Relative: 12 % (ref 12–46)
Lymphs Abs: 1.7 10*3/uL (ref 0.7–4.0)
Monocytes Absolute: 1 10*3/uL (ref 0.1–1.0)
Neutro Abs: 10.9 10*3/uL — ABNORMAL HIGH (ref 1.7–7.7)

## 2010-11-08 LAB — LIPASE, BLOOD: Lipase: 22 U/L (ref 11–59)

## 2010-11-08 IMAGING — CR DG CHEST 1V PORT
1 series · 1 of 1 positions shown · non-contrast
Comparison: Chest x-ray [DATE].

CLINICAL DATA: Chest pain and shortness of breath.  History of
sarcoidosis.

PORTABLE CHEST - 1 VIEW

[view not recorded]
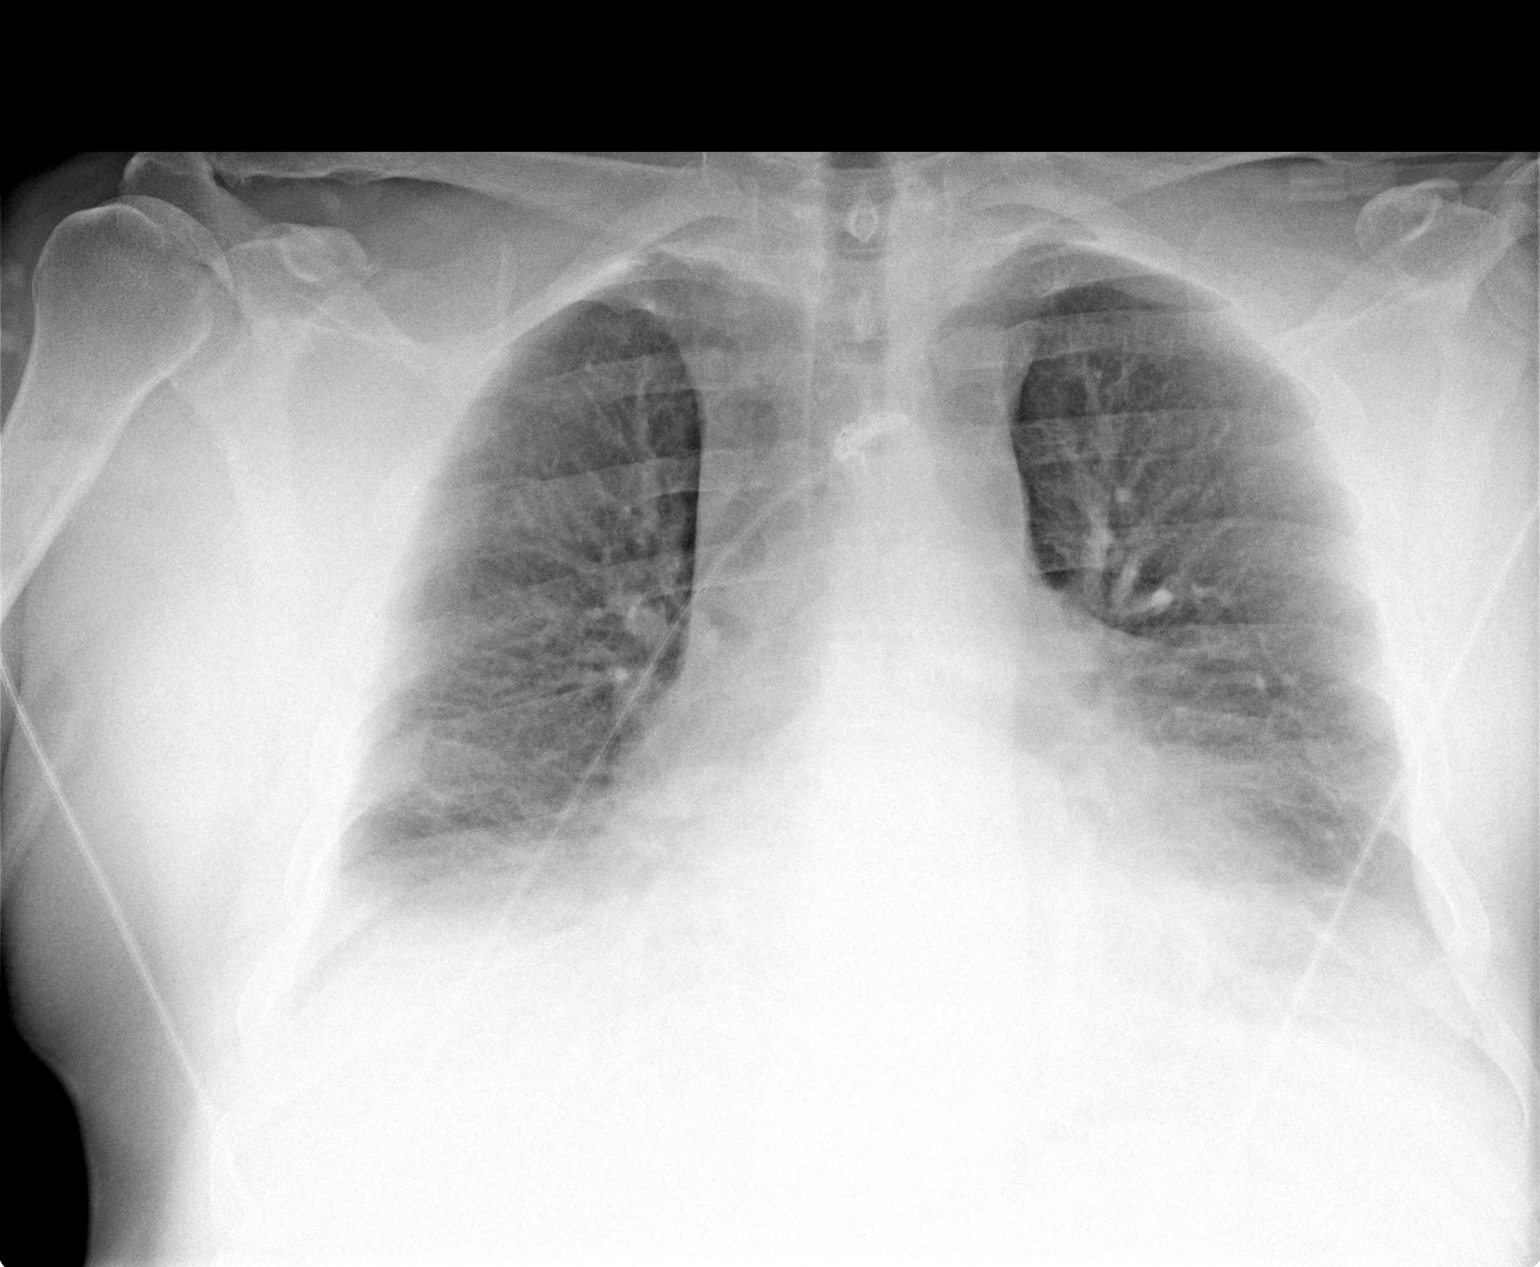

[1 of 1 positions shown; findings below may reference images not displayed]

FINDINGS: The heart is mildly enlarged but stable.  Prominent
epicardial fat is noted.  There are chronic interstitial changes
with thickening the lung bases.  Superimposed vascular congestion
is suspected.  No definite pleural effusions.
IMPRESSION: 1.  Chronic lung changes from sarcoidosis.
2.  Possible superimposed vascular congestion.

## 2010-11-08 MED ORDER — MORPHINE SULFATE 2 MG/ML IJ SOLN
2.0000 mg | Freq: Once | INTRAMUSCULAR | Status: AC
  Administered 2010-11-08: 2 mg via INTRAVENOUS
  Filled 2010-11-08: qty 1

## 2010-11-08 MED ORDER — NITROGLYCERIN 0.4 MG SL SUBL
0.4000 mg | SUBLINGUAL_TABLET | SUBLINGUAL | Status: AC | PRN
Start: 2010-11-08 — End: 2010-11-08
  Administered 2010-11-08 (×3): 0.4 mg via SUBLINGUAL
  Filled 2010-11-08: qty 25

## 2010-11-08 MED ORDER — ASPIRIN 81 MG PO CHEW
324.0000 mg | CHEWABLE_TABLET | Freq: Once | ORAL | Status: AC
  Administered 2010-11-08: 324 mg via ORAL
  Filled 2010-11-08: qty 1
  Filled 2010-11-08: qty 3

## 2010-11-08 NOTE — ED Notes (Signed)
Pt aware of plans for admission, and wait for an available transport team to transport. Rates pain 1/10 at this time. Morphine given and will reassess shortly.

## 2010-11-08 NOTE — ED Notes (Signed)
Report given to [facility] who are approx away.

## 2010-11-08 NOTE — ED Notes (Signed)
IV charted "removed" for documentation purposes only. IV does remain intact to RAC-20g and flushes well.

## 2010-11-08 NOTE — ED Notes (Signed)
Pt continues to wait on available transport truck. Verbalized understanding.

## 2010-11-08 NOTE — ED Provider Notes (Signed)
History     CSN: [id number] Arrival date & time: 11/08/2010  5:45 PM  Chief Complaint  Patient presents with  . Abdominal Pain  . Chest Pain  . Shortness of Breath   HPI Comments: Patient states that he started with discomfort in the chest yesterday while [place] the beach.  Was with the grandkids getting out of the pool when it started.  Has been persistent since that time.  Came home today and presents here shortly after arriving home.  Patient is a 65 y.o. male presenting with abdominal pain, chest pain, and shortness of breath. The history is provided by the patient.  Abdominal Pain The primary symptoms of the illness include abdominal pain and shortness of breath. The primary symptoms of the illness do not include fever. The current episode started yesterday. The onset of the illness was sudden. The problem has been gradually worsening.  Symptoms associated with the illness do not include chills.  Chest Pain Primary symptoms include shortness of breath and abdominal pain. Pertinent negatives for primary symptoms include no fever and no cough.    Shortness of Breath  Associated symptoms include chest pain and shortness of breath. Pertinent negatives include no fever and no cough.    Past Medical History  Diagnosis Date  . Type 2 diabetes mellitus   . GERD (gastroesophageal reflux disease)   . Sarcoid   . Coronary artery disease   . Adrenal insufficiency   . OSA (obstructive sleep apnea)     Past Surgical History  Procedure Date  . Coronary stent placement 2009    in LAD and side branch PTCA  . Cataract extraction 2011    bilat  . Intercostal nerve block 2011    x2. lumbar spine    Family History  Problem Relation Age of Onset  . Asthma    . Kidney disease    . Heart attack    . Sarcoidosis      History  Substance Use Topics  . Smoking status: Never Smoker   . Smokeless tobacco: Never Used  . Alcohol Use: Not on file      Review of Systems  Constitutional:  Negative for fever and chills.  HENT: Negative for neck pain and neck stiffness.   Respiratory: Positive for shortness of breath. Negative for cough.   Cardiovascular: Positive for chest pain. Negative for leg swelling.  Gastrointestinal: Positive for abdominal pain.  All other systems reviewed and are negative.    Physical Exam  BP 165/73  Pulse 89  Resp 20  SpO2 95%  Physical Exam  Constitutional: He is oriented to person, place, and time. He appears well-developed and well-nourished. No distress.  HENT:  Head: Normocephalic and atraumatic.  Neck: Normal range of motion. Neck supple.  Cardiovascular: Normal rate and regular rhythm.  Exam reveals no gallop and no friction rub.   No murmur heard. Pulmonary/Chest: No respiratory distress. He has no wheezes. He has no rales.  Abdominal: Soft. He exhibits no distension. There is tenderness.       ttp in ruq.  No rebound or guarding.  Musculoskeletal: Normal range of motion.  Lymphadenopathy:    He has no cervical adenopathy.  Neurological: He is alert and oriented to person, place, and time.  Skin: Skin is warm and dry. He is not diaphoretic.    ED Course  Procedures  MDM EKG shows nsr @ 96 bpm.  No change from 11/12/09.  Spoke with Dr. Freida Busman from Cardiology.  Agrees to  accept patient in transfer.        Geoffery Lyons, MD 11/08/10 2040

## 2010-11-08 NOTE — ED Notes (Signed)
Pt states started having pain lower chest and upper epigastric pain states he [place] with grandchild started having the pain and shortness of breath states he couldn't sleep well last night is now able to lie flat. Reports indigestion and excessive belching pt also vomiting during the night . Pt denies any radiation of the pain into the arm or jaw.

## 2010-11-08 NOTE — ED Notes (Signed)
Pt reports he is chest pain free at this time. Resting with eyes closed. Wife signed transfer consent form. Waiting for an available transfer truck.

## 2010-11-08 NOTE — ED Notes (Signed)
Report given to [facility] RN on 2035

## 2010-11-09 ENCOUNTER — Encounter (HOSPITAL_COMMUNITY): Payer: Self-pay

## 2010-11-09 ENCOUNTER — Observation Stay (HOSPITAL_COMMUNITY)
Admission: AD | Admit: 2010-11-09 | Discharge: 2010-11-10 | Disposition: A | Discharge status: Home / Self Care | Payer: BC Managed Care – PPO | Source: Other Acute Inpatient Hospital | Attending: Cardiology | Admitting: Cardiology

## 2010-11-09 ENCOUNTER — Inpatient Hospital Stay (HOSPITAL_COMMUNITY): Payer: BC Managed Care – PPO

## 2010-11-09 DIAGNOSIS — R079 Chest pain, unspecified: Secondary | ICD-10-CM

## 2010-11-09 HISTORY — DX: Essential (primary) hypertension: I10

## 2010-11-09 LAB — URINALYSIS, ROUTINE W REFLEX MICROSCOPIC
Glucose, UA: >1000 mg/dL — AB
Hgb urine dipstick: NEGATIVE
Specific Gravity, Urine: 1.01 (ref 1.005–1.030)

## 2010-11-09 LAB — BASIC METABOLIC PANEL
BUN: 17 mg/dL (ref 6–23)
CO2: 28 mEq/L (ref 19–32)
Calcium: 9.3 mg/dL (ref 8.4–10.5)
Chloride: 100 mEq/L (ref 96–112)
Creatinine, Ser: 1.06 mg/dL (ref 0.50–1.35)
GFR calc Af Amer: >60 mL/min (ref >60)

## 2010-11-09 LAB — GLUCOSE, CAPILLARY
Glucose-Capillary: 224 mg/dL — ABNORMAL HIGH (ref 70–99)
Glucose-Capillary: 186 mg/dL — ABNORMAL HIGH (ref 70–99)
Glucose-Capillary: 381 mg/dL — ABNORMAL HIGH (ref 70–99)

## 2010-11-09 LAB — PROTIME-INR: INR: 1.06 (ref 0.00–1.49)

## 2010-11-09 LAB — D-DIMER, QUANTITATIVE: D-Dimer, Quant: 1.08 ug/mL-FEU — ABNORMAL HIGH (ref 0.00–0.48)

## 2010-11-09 LAB — CARDIAC PANEL(CRET KIN+CKTOT+MB+TROPI)
Relative Index: INVALID (ref 0.0–2.5)
Relative Index: INVALID (ref 0.0–2.5)
Relative Index: INVALID (ref 0.0–2.5)
Total CK: 90 U/L (ref 7–232)
Troponin I: <0.3 ng/mL (ref <0.30)
Troponin I: <0.3 ng/mL (ref <0.30)

## 2010-11-09 LAB — URINE MICROSCOPIC-ADD ON

## 2010-11-09 LAB — PRO B NATRIURETIC PEPTIDE: Pro B Natriuretic peptide (BNP): 84.5 pg/mL (ref 0–125)

## 2010-11-09 IMAGING — US US ABDOMEN COMPLETE
1 series · 7 of 7 positions shown · non-contrast
Comparison: CT dated [DATE]

CLINICAL DATA: Vomiting

COMPLETE ABDOMINAL ULTRASOUND

[Series 1: us abdomen complete · 0.27mm/px · 7 of 7 slices shown]
[im 1/7]
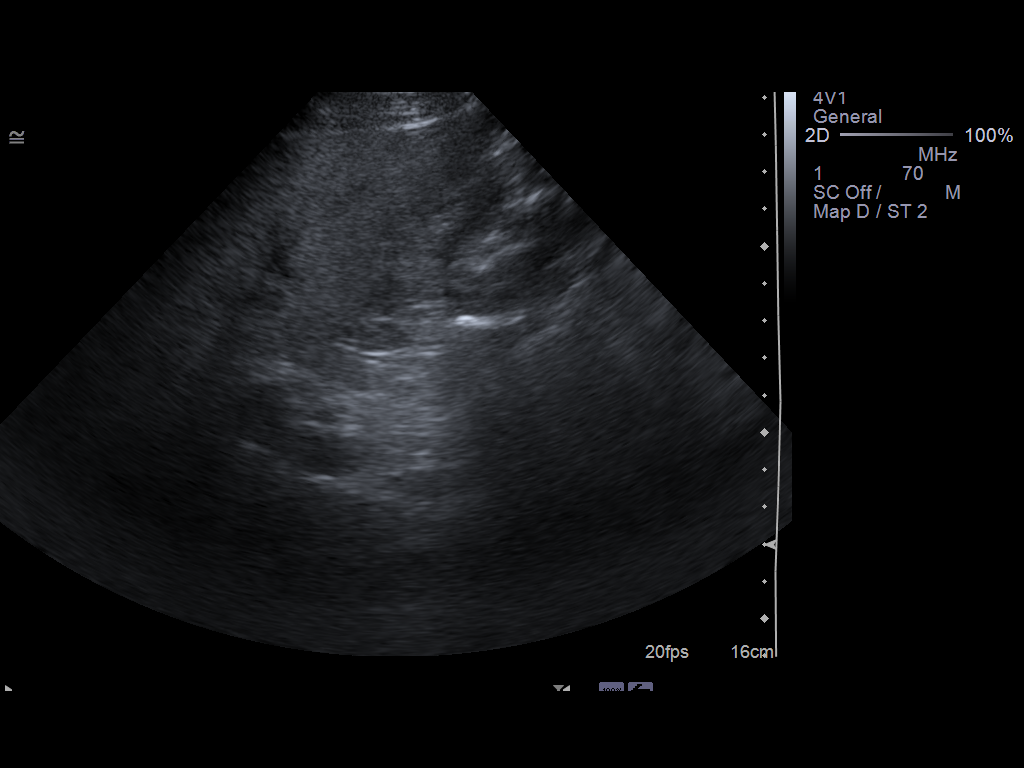
[im 2/7]
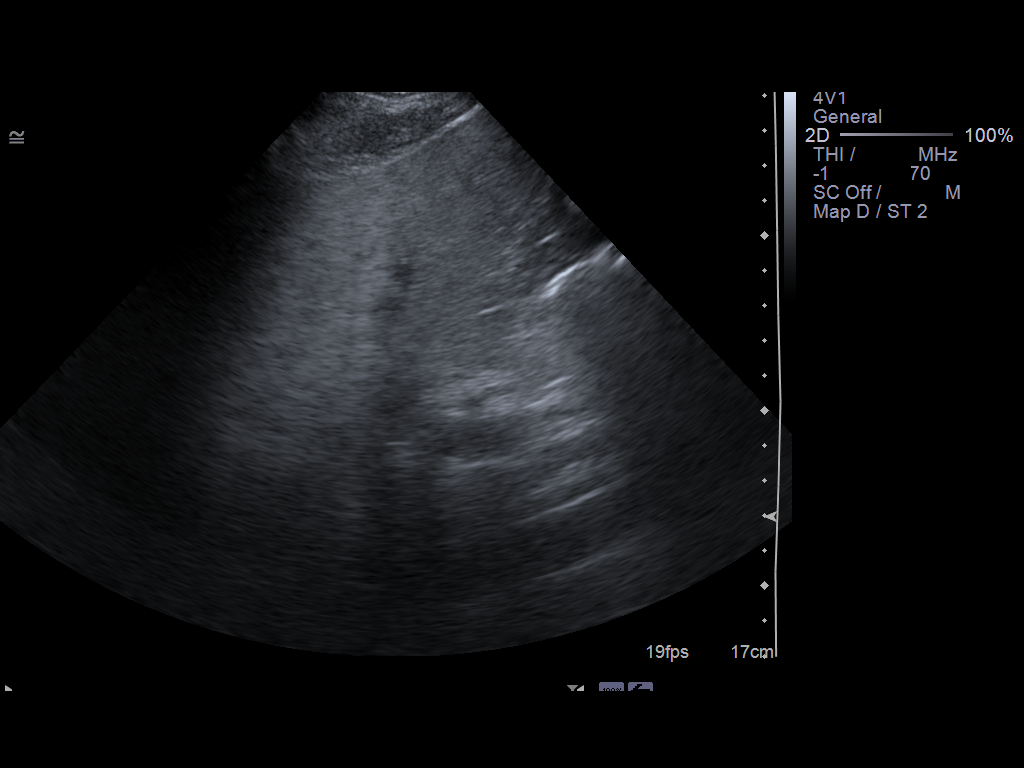
[im 3/7]
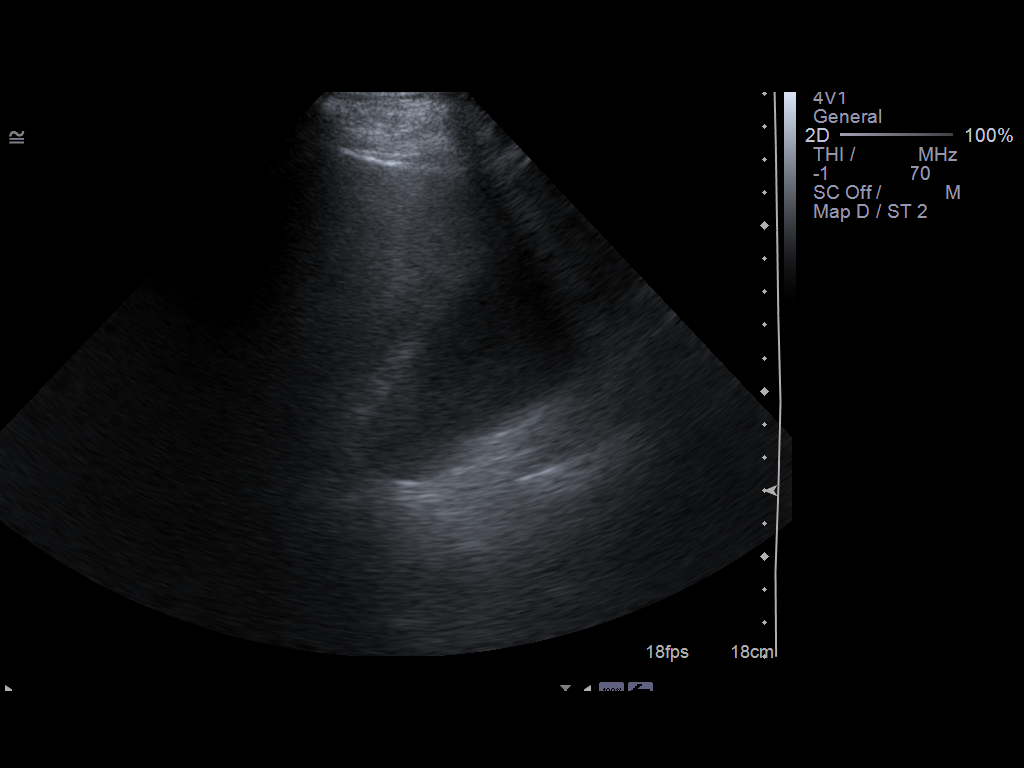
[im 4/7]
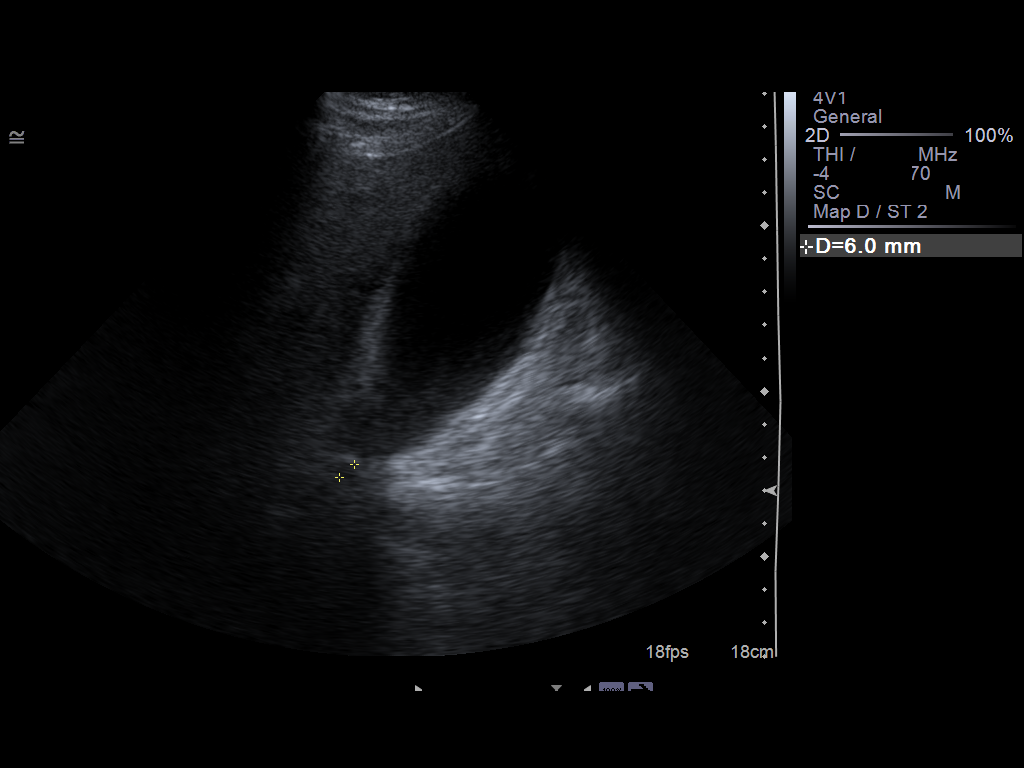
[im 5/7]
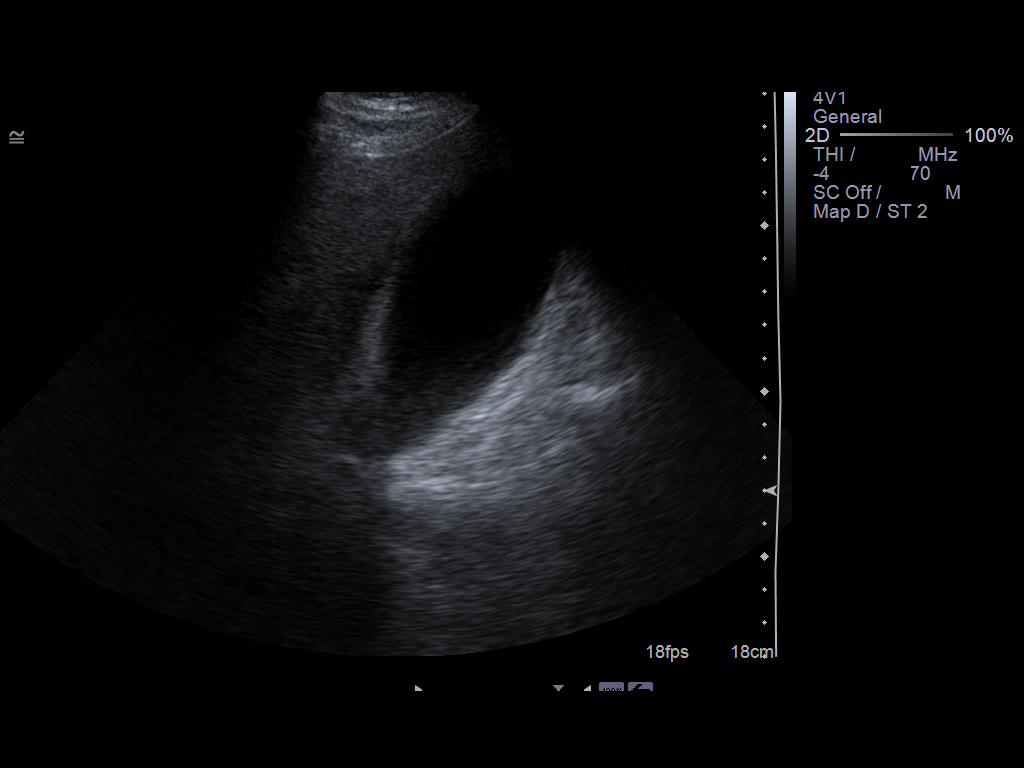
[im 6/7]
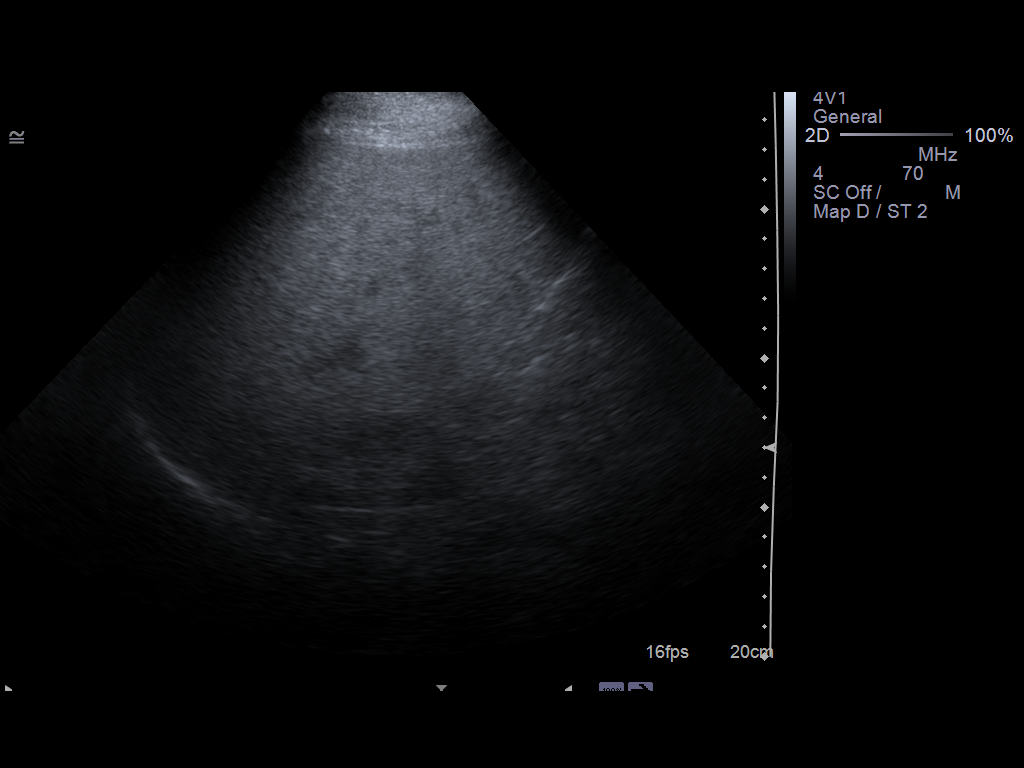
[im 7/7]
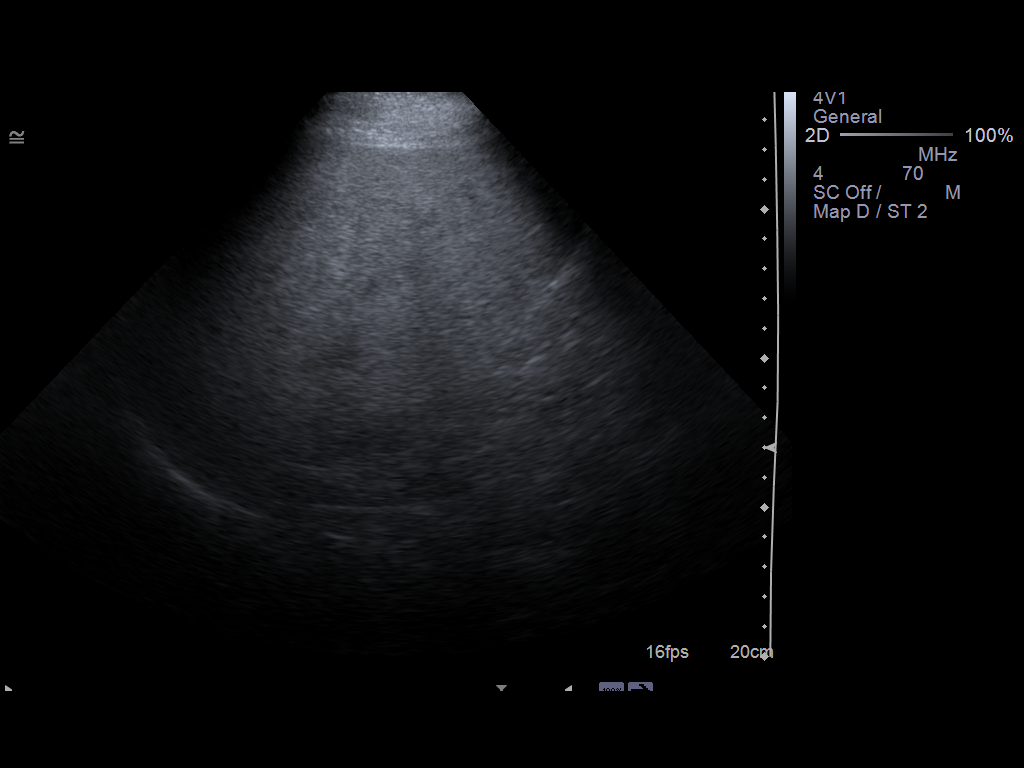

[7 of 7 positions shown; findings below may reference images not displayed]

FINDINGS: Gallbladder:  Gallbladder distension with layering small
gallstones.  No pericholecystic fluid, gallbladder wall thickening,
or sonographic Murphy's sign.

Common bile duct:  Measures 6.4 mm.

Liver:  Hyperechoic, heterogeneous hepatic echotexture, likely
reflecting hepatic steatosis.  No focal hepatic lesions.  Trace
perihepatic ascites.

IVC:  Poorly visualized due to overlying bowel gas.

Pancreas:  Poorly visualized due to overlying bowel gas.

Spleen:  Measures 7.8 cm.

Right Kidney:  Measures 12.0 cm.  No mass or hydronephrosis.

Left Kidney:  Measures 12.5 cm.  No mass or hydronephrosis.

Abdominal aorta:  No aneurysm.
IMPRESSION: Gallbladder distension with cholelithiasis.  No associated
inflammatory changes to suggest acute cholecystitis.

Hepatic steatosis.  Trace perihepatic ascites.

## 2010-11-09 IMAGING — US US ABDOMEN COMPLETE
1 series · 14 of 25 positions shown · non-contrast
Comparison: CT dated [DATE]

CLINICAL DATA: Vomiting

COMPLETE ABDOMINAL ULTRASOUND

[Series 1: us abdomen complete · 0.31mm/px · 14 of 72 slices shown]
[im 1/72]
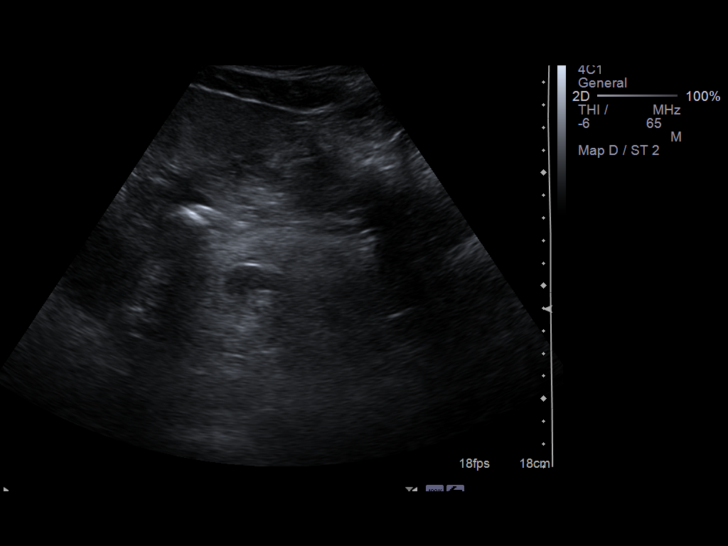
[im 6/72]
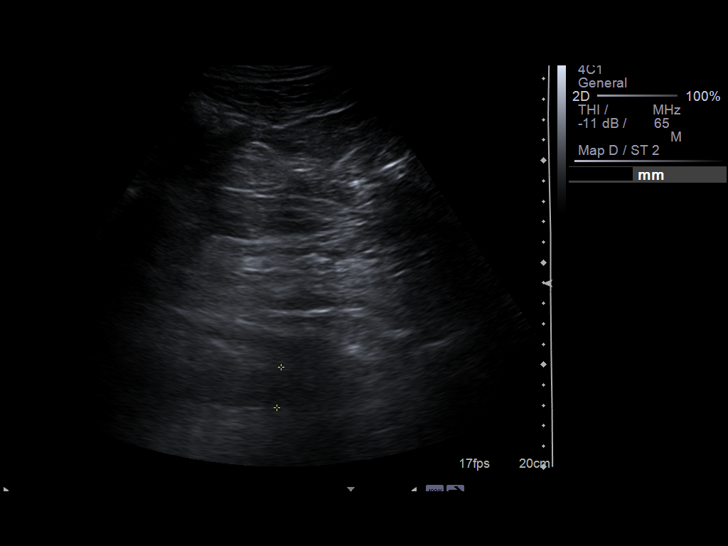
[im 12/72]
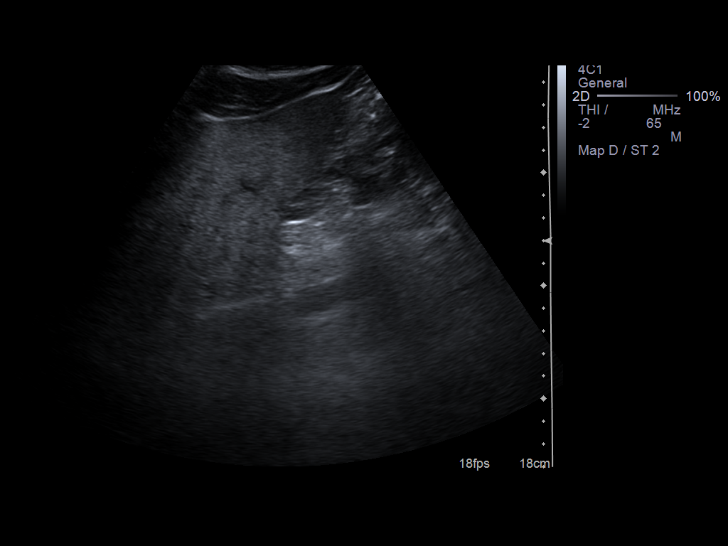
[im 18/72]
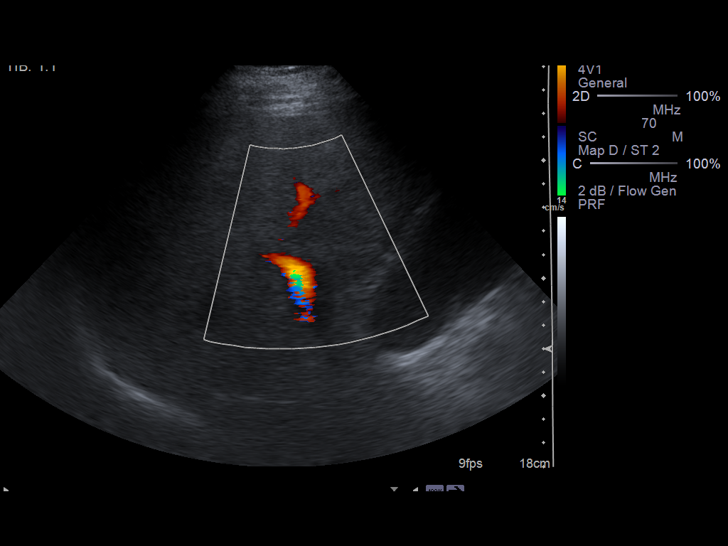
[im 24/72]
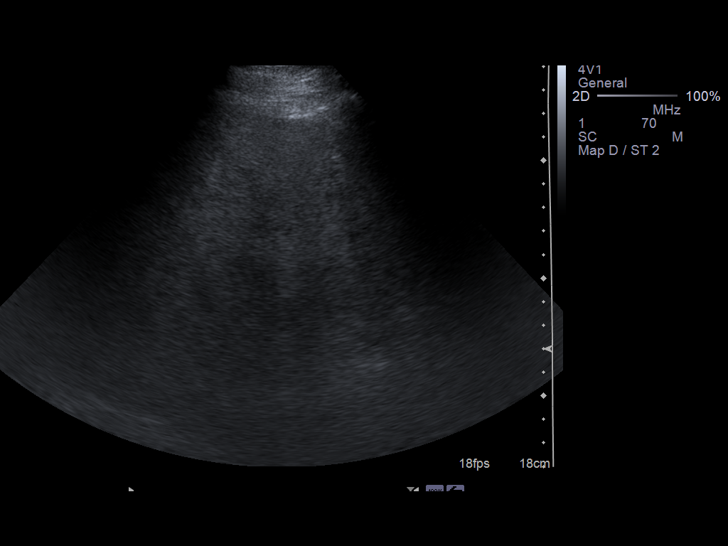
[im 27/72]
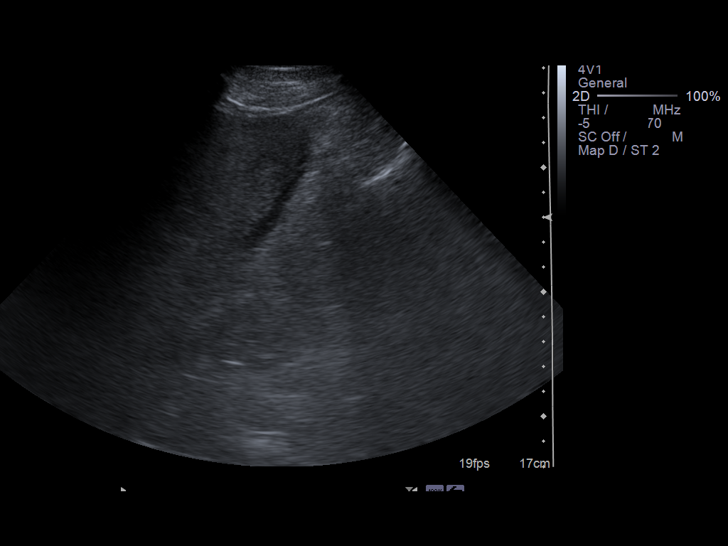
[im 33/72]
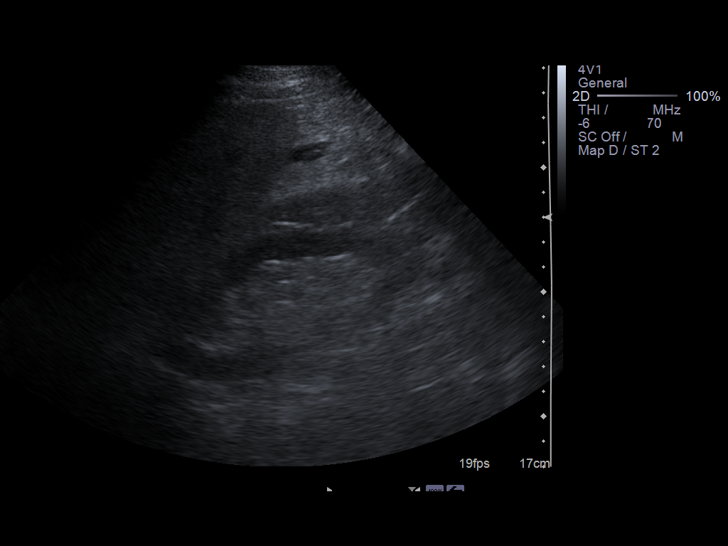
[im 39/72]
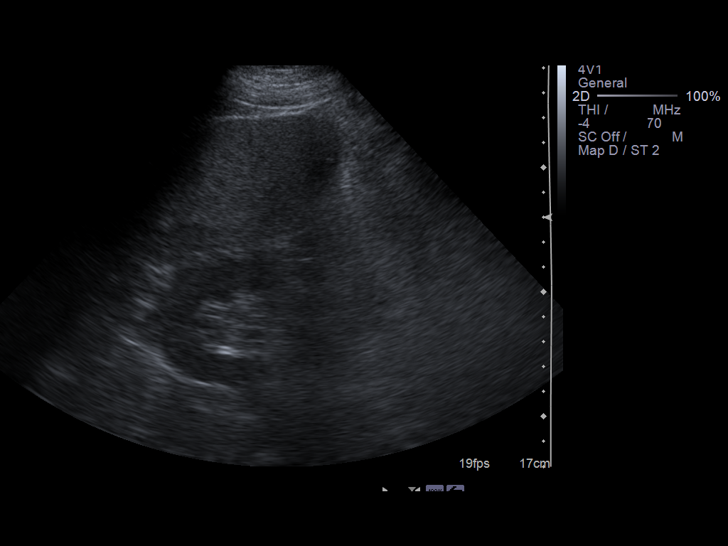
[im 45/72]
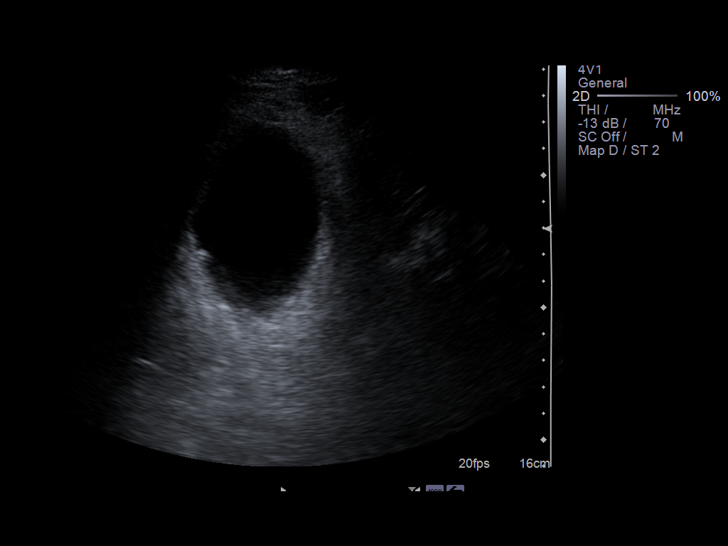
[im 48/72]
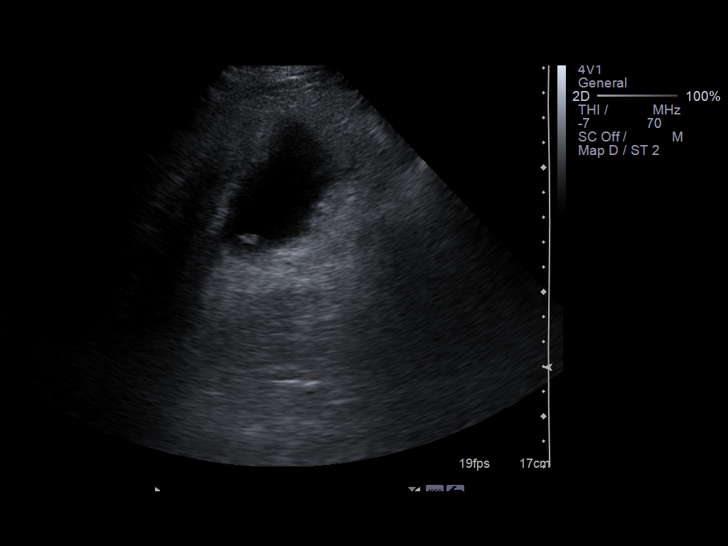
[im 54/72]
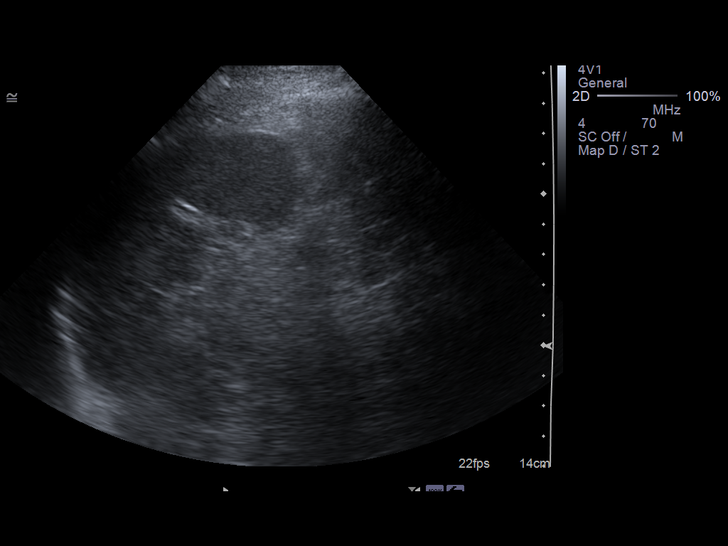
[im 60/72]
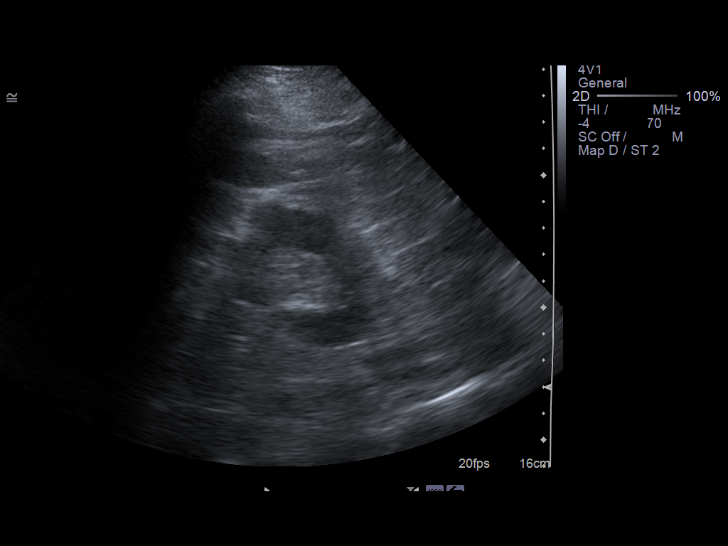
[im 66/72]
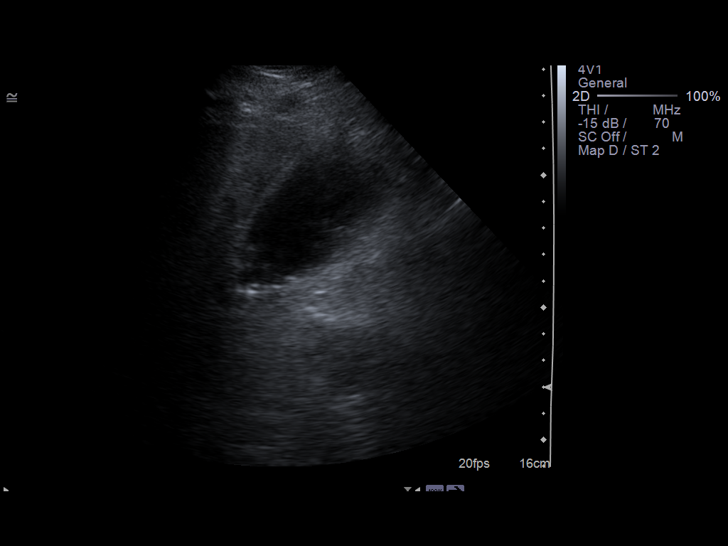
[im 72/72]
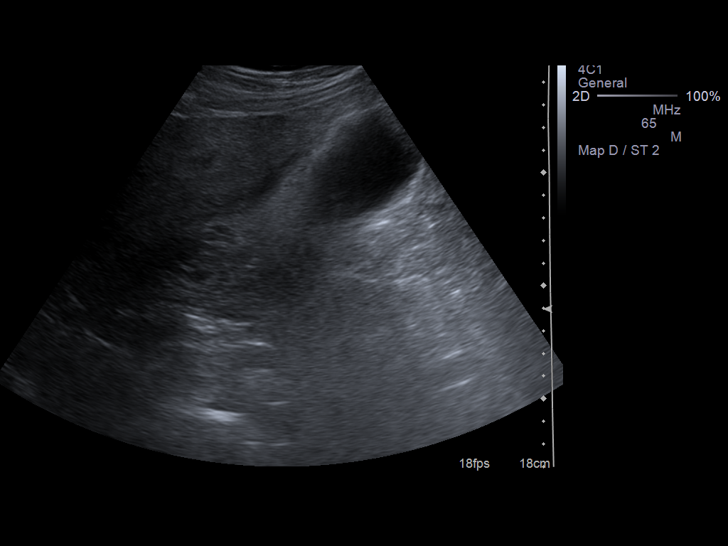

[14 of 25 positions shown; findings below may reference images not displayed]

FINDINGS: Gallbladder:  Gallbladder distension with layering small
gallstones.  No pericholecystic fluid, gallbladder wall thickening,
or sonographic Murphy's sign.

Common bile duct:  Measures 6.4 mm.

Liver:  Hyperechoic, heterogeneous hepatic echotexture, likely
reflecting hepatic steatosis.  No focal hepatic lesions.  Trace
perihepatic ascites.

IVC:  Poorly visualized due to overlying bowel gas.

Pancreas:  Poorly visualized due to overlying bowel gas.

Spleen:  Measures 7.8 cm.

Right Kidney:  Measures 12.0 cm.  No mass or hydronephrosis.

Left Kidney:  Measures 12.5 cm.  No mass or hydronephrosis.

Abdominal aorta:  No aneurysm.
IMPRESSION: Gallbladder distension with cholelithiasis.  No associated
inflammatory changes to suggest acute cholecystitis.

Hepatic steatosis.  Trace perihepatic ascites.

## 2010-11-09 IMAGING — CT CT ANGIO CHEST
2 of 7 series · 19 of 46 positions shown · IV contrast (APPLIED)
Comparison: [DATE]

CLINICAL DATA: Elevated D-dimer.  Chest pain and shortness of
breath for 2 days.

CT ANGIOGRAPHY CHEST WITH CONTRAST
TECHNIQUE: Multidetector CT imaging of the chest was performed
using the standard protocol during bolus administration of
intravenous contrast.  Multiplanar CT image reconstructions
including MIPs were obtained to evaluate the vascular anatomy.
Contrast:  100 ml [TB]

[Series 8: pulm embolism 1.0 b25f thin · axial · 0.74mm/px · z∈[+1220,+1462]mm · 16 of 268 slices shown]
[im 13/268  lung]
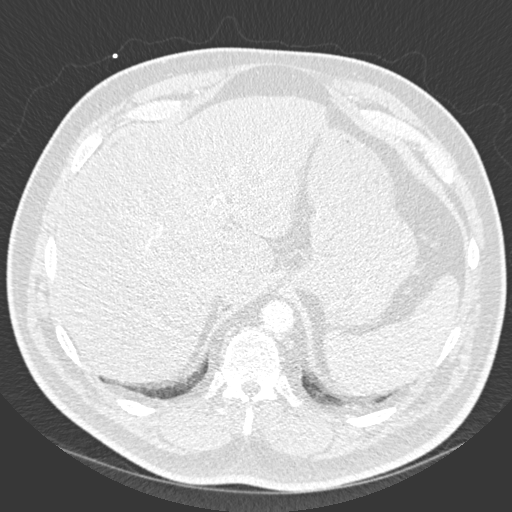
[im 25/268  soft-tissue]
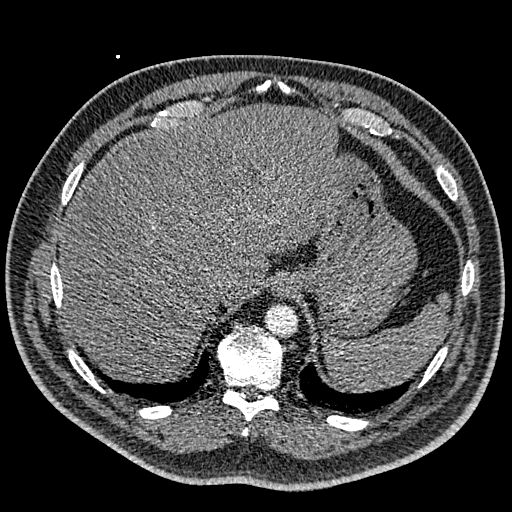
[im 49/268  lung]
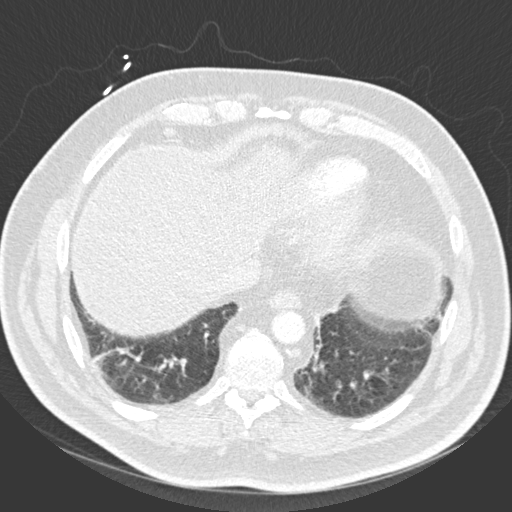
[im 61/268  soft-tissue]
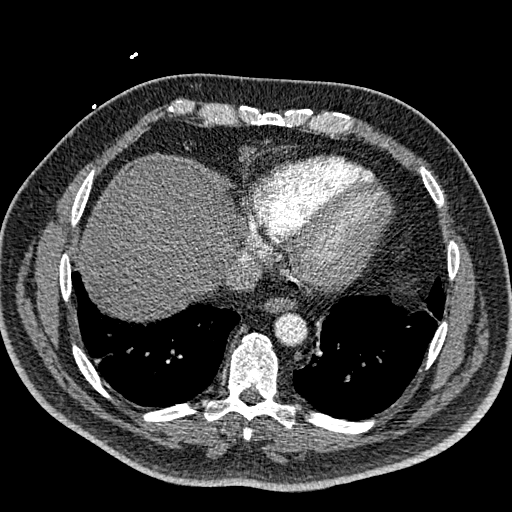
[im 73/268  lung]
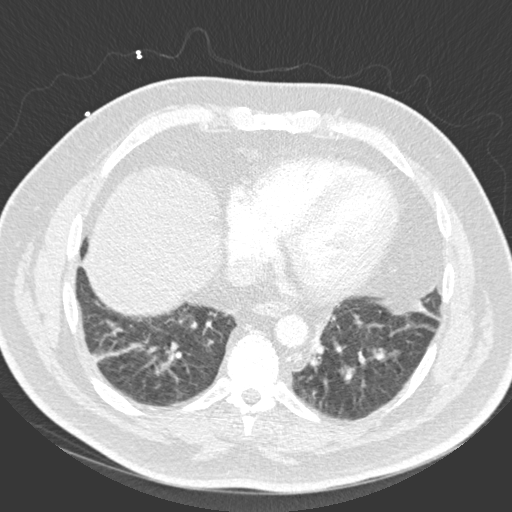
[im 98/268  soft-tissue]
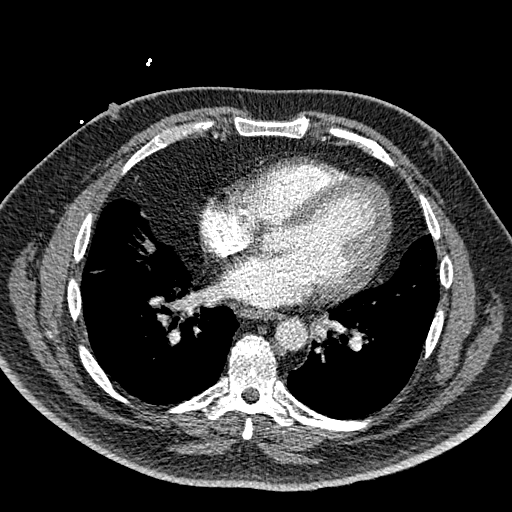
[im 110/268  lung]
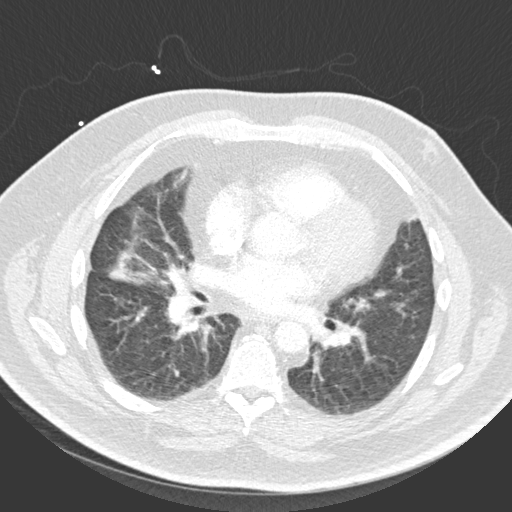
[im 122/268  soft-tissue]
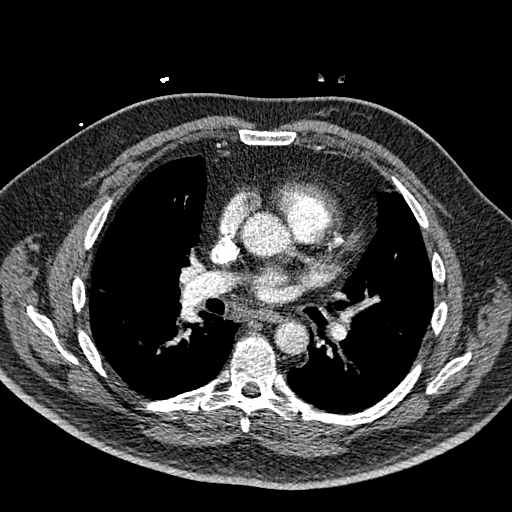
[im 146/268  lung]
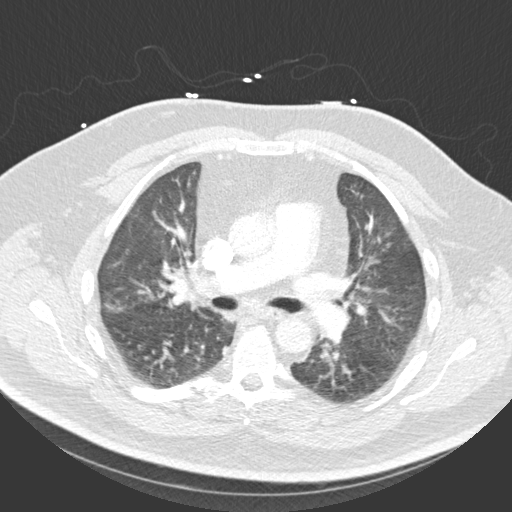
[im 158/268  soft-tissue]
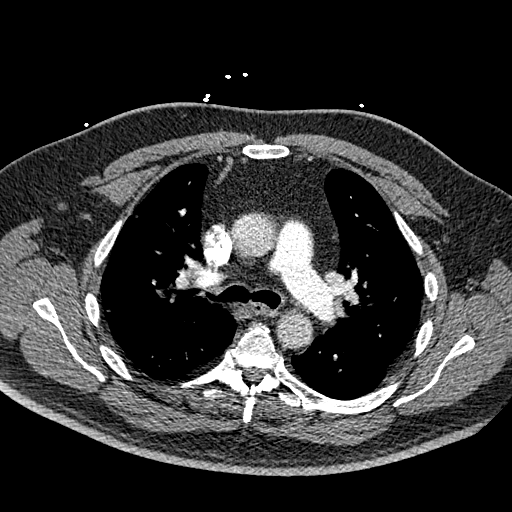
[im 170/268  lung]
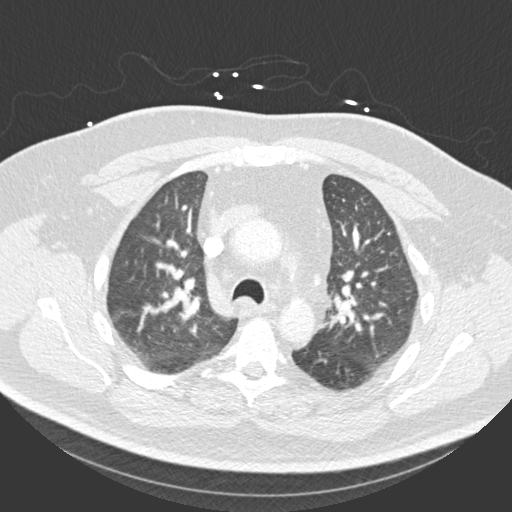
[im 195/268  soft-tissue]
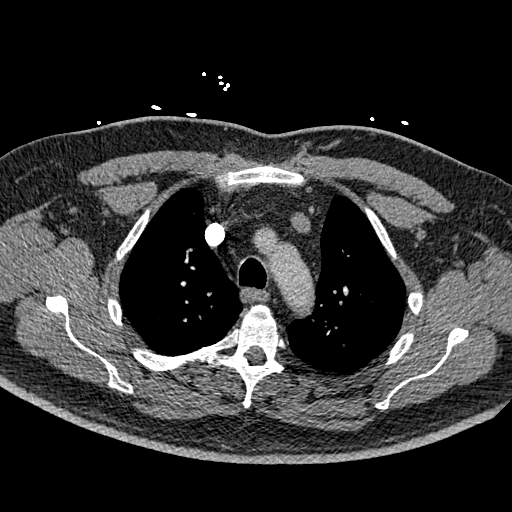
[im 207/268  lung]
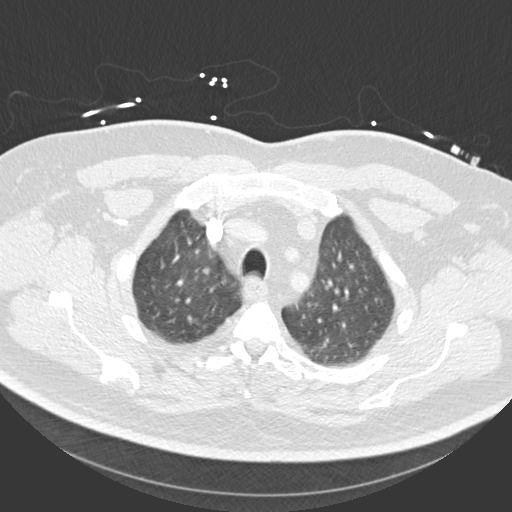
[im 219/268  soft-tissue]
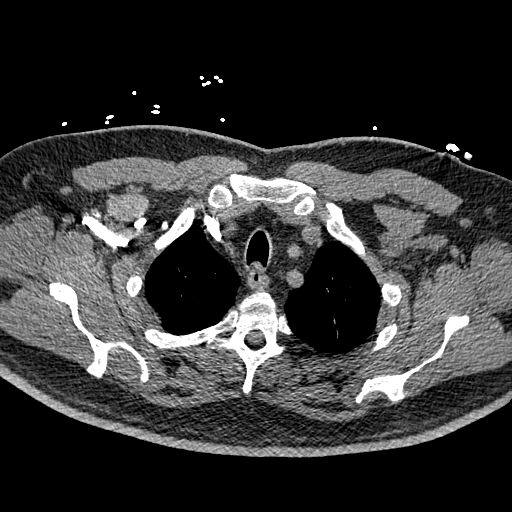
[im 243/268  lung]
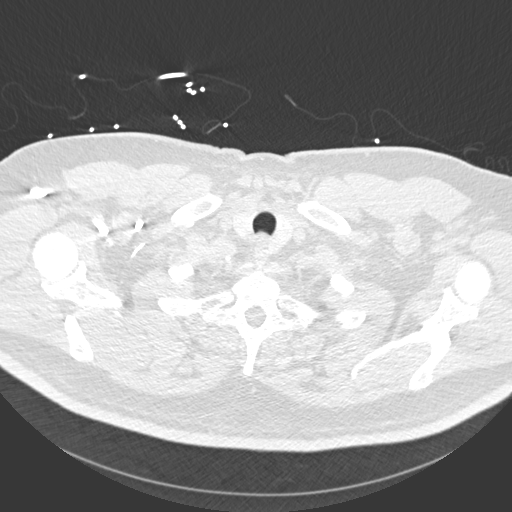
[im 255/268  soft-tissue]
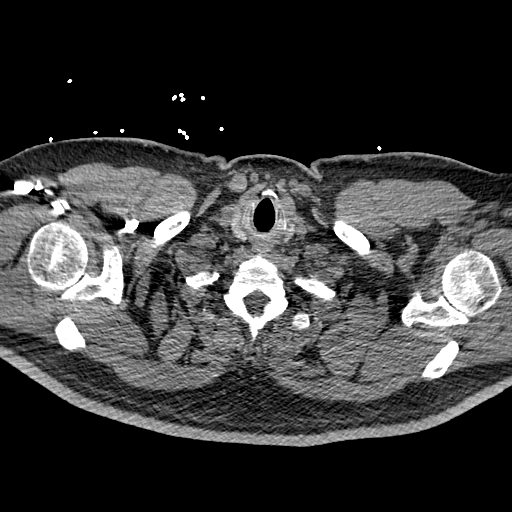

[Series 604: coronal · coronal · 0.74mm/px · 3 of 97 slices shown]
[im 25/97  soft-tissue]
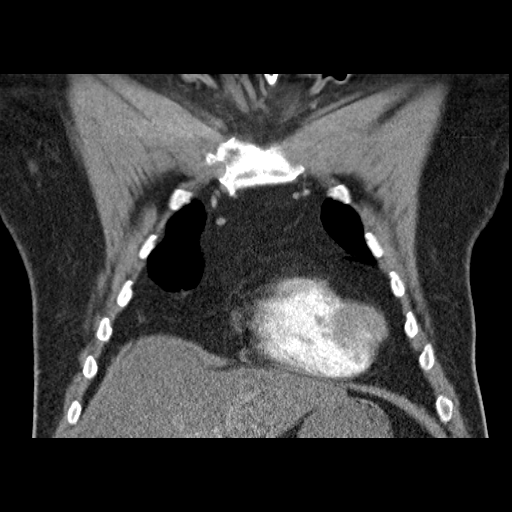
[im 49/97  soft-tissue]
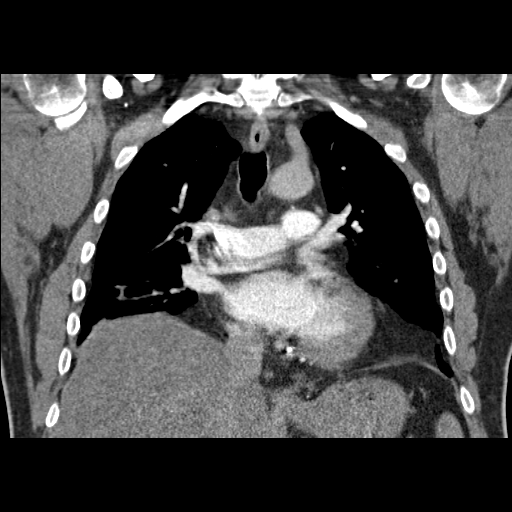
[im 73/97  soft-tissue]
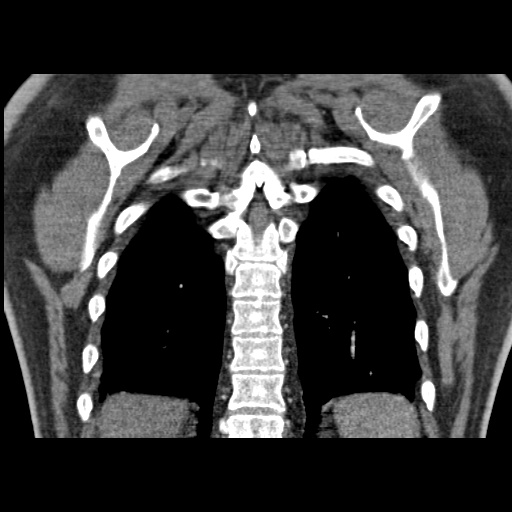

[19 of 46 positions shown; findings below may reference images not displayed]

FINDINGS: The pulmonary arteries are well opacified.  There is no
evidence for acute pulmonary embolus.  Heart size is normal.  No
pleural or pericardial effusion. The visualized portion of the
thyroid gland has a normal appearance.  No mediastinal, hilar, or
axillary adenopathy.  There is bibasilar atelectasis.  No focal
consolidations identified.

Images of the upper abdomen are unremarkable.  The mild
degenerative changes are seen in the spine.

Review of the MIP images confirms the above findings.
IMPRESSION: 1.  Technically adequate exam showing no evidence for acute
pulmonary embolus.
2. No evidence for acute cardiopulmonary abnormality.

## 2010-11-09 MED ORDER — TECHNETIUM TC 99M TETROFOSMIN IV KIT
10.0000 | PACK | Freq: Once | INTRAVENOUS | Status: AC | PRN
  Administered 2010-11-09: 10 via INTRAVENOUS

## 2010-11-09 MED ORDER — TECHNETIUM TC 99M TETROFOSMIN IV KIT
30.0000 | PACK | Freq: Once | INTRAVENOUS | Status: AC | PRN
  Administered 2010-11-09: 30 via INTRAVENOUS

## 2010-11-09 MED ORDER — IOHEXOL 300 MG/ML  SOLN
100.0000 mL | Freq: Once | INTRAMUSCULAR | Status: AC | PRN
  Administered 2010-11-09: 100 mL via INTRAVENOUS

## 2010-11-10 LAB — BASIC METABOLIC PANEL
GFR calc Af Amer: >60 mL/min (ref >60)
GFR calc non Af Amer: >60 mL/min (ref >60)
Potassium: 3.4 mEq/L — ABNORMAL LOW (ref 3.5–5.1)
Sodium: 136 mEq/L (ref 135–145)

## 2010-11-10 LAB — CBC
Platelets: 256 10*3/uL (ref 150–400)
RDW: 13.5 % (ref 11.5–15.5)
WBC: 13.9 10*3/uL — ABNORMAL HIGH (ref 4.0–10.5)

## 2010-11-10 LAB — GLUCOSE, CAPILLARY: Glucose-Capillary: 228 mg/dL — ABNORMAL HIGH (ref 70–99)

## 2010-11-10 LAB — URINE CULTURE
Culture  Setup Time: 201209042254
Special Requests: NEGATIVE

## 2010-11-11 NOTE — Discharge Summary (Signed)
NAMEAITHAN, FARRELLY NO.:  [id number]  MEDICAL RECORD NO.:  [id number]  LOCATION:  2035                         FACILITY:  [facility]  PHYSICIAN:  Jake Bathe, MD      DATE OF BIRTH:  Mar 20, 1945  DATE OF ADMISSION:  11/09/2010 DATE OF DISCHARGE:  11/10/2010                              DISCHARGE SUMMARY   CARDIOLOGIST:  Jake Bathe, MD  GASTROENTEROLOGIST:  Barbette Hair. Arlyce Dice, MD, Clementeen Graham  PRIMARY PHYSICIAN:  Holley Bouche, MD  FINAL DIAGNOSES: 1. Chest pain. 2. Possible viral gastroenteritis. 3. Diastolic heart failure. 4. Abdominal discomfort. 5. Dyspnea. 6. Obesity. 7. Diabetes. 8. Coronary artery disease, status post drug-eluting stent to left     anterior descending and drug-eluting stent to posterolateral branch     on prior occasion. 9. Hyperlipidemia with statin intolerances. 10.Obstructive sleep apnea.  CONSULTATIONS:  None.  PROCEDURES: 1. CT angiogram of chest showed no evidence of pulmonary emboli.  No     mediastinal, hilar, or axillary lymphadenopathy. 2. Ultrasound of the abdomen demonstrated a common bile duct of 6.4 mm     with gallbladder distention, layering of small gall stones.  No     pericholecystic fluid.  No Murphy sign.  No gallbladder wall     thickening.  He had trace perihepatic ascites. 3. Nuclear stress test showing ejection fraction of 56% with no     evidence of ischemia.  LABORATORY DATA UPON DISCHARGE:  Sodium 136, potassium 3.4 (repleted), BUN 24, creatinine 1.1, and glucose 228.  White count 13.9 (on chronic prednisone), hemoglobin 14.9, platelet count 256.  Urinalysis other than glucose in urine normal.  Cardiac markers were all normal.  D-dimer was elevated at 1.08 prompting CT scan of chest to rule out PE which was negative.  Pro-BNP was 84, normal.  Lipase was 22, normal.  LFTs were normal except for mildly elevated ALT of 57.  EKG showed sinus rhythm with left axis deviation/left anterior fascicular block,  no ST-segment changes.  HOSPITAL COURSE:  Mr. Fadden is a 65 year old man with coronary artery disease, hypertension, diabetes, and hyperlipidemia who was at [facility] and began to have quite severe chest discomfort lasting several hours in duration.  He has been experiencing as well for the past 3-4 weeks increased work of breathing with exertion.  He had one episode of nausea and vomiting.  Nitroglycerin in the emergency room did help subsided chest discomfort.  EKGs were unremarkable as well as cardiac markers.  The next morning, he underwent nuclear stress test which showed no evidence of ischemia and was low risk with normal ejection fraction.  On exam, he did have some mild right upper quadrant tenderness, therefore an abdominal ultrasound was performed which showed gallbladder distention, but no evidence of cholecystitis.  He will be following up with Dr. Arlyce Dice, his GI physician regarding this.  He also had an elevated D-dimer and with his symptoms underwent a CT angiogram of chest which was negative for pulmonary embolism.  Interestingly, he did feel better with Lasix infusion and normally is taking Lasix 80 mg once a day at home and I have prescribed 80 mg  a.m. and 40 mg p.m.  My hypothesis is that he does have degree of diastolic heart failure as well.  He described orthopnea at home.  No coughing, no joint pain, no melena, no signs of anemia.  No signs of wheezing or sarcoid flare.  He did have one isolated temperature of 100.3 early yesterday morning, but all subsequent temperatures have been normal.  Urinalysis was normal. No blood cultures were drawn at the time of temperature spike.  He does not have another temperature spike, therefore no blood cultures were drawn.  He is improved, feels mild decreased appetite.  Ambulating well.  Wife was present throughout hospitalization.  We have ruled out all major cardiovascular risks such as myocardial infarction, pulmonary  embolism, and aortic dissection.  I feel comfortable allowing him to be discharged.  Perhaps he had a viral gastroenteritis.  I have asked him to make a followup appointment with Dr. Arlyce Dice to discuss his gallbladder.  DISCHARGE MEDICATIONS:  Same as admission except for additional Lasix 40 mg p.m. with his 80 mg a.m.  Continue Plavix 75 mg a day, aspirin 81 mg a day, Imdur 30 mg a day, prednisone 10 mg a day, losartan 100 mg a day, Novolin 70/30, 45-55 units 3 times a day, potassium chloride 10 mEq 2 tablets twice a day.  The pharmacy reconciliation notes Lasix, however, he was telling me he was just taking 80 mg once a day.  I will reconcile with the patient to clarify dosing.  My intention is to give him slightly elevated dose.  FOLLOWUP:  He will follow up with me in approximately 1 week, appointment to be scheduled.  DICTATION TIME:  35 minutes spent with med reconciliation, discharge instructions, patient instructions, and review of medical records.     Jake Bathe, MD     MCS/MEDQ  D:  11/10/2010  T:  11/10/2010  Job:  [id number]  Electronically Signed by Donato Schultz MD on 11/11/2010 09:04:01 PM

## 2010-11-15 ENCOUNTER — Other Ambulatory Visit: Payer: Self-pay | Admitting: Critical Care Medicine

## 2010-11-28 NOTE — H&P (Signed)
NAMETHOMSON, HERBERS NO.:  [id number]  MEDICAL RECORD NO.:  [id number]  LOCATION:  2035                         FACILITY:  [facility]  PHYSICIAN:  Brayton El, MD    DATE OF BIRTH:  Feb 10, 1946  DATE OF ADMISSION:  11/09/2010 DATE OF DISCHARGE:                             HISTORY & PHYSICAL   CHIEF COMPLAINT:  Chest pain.  HISTORY OF PRESENT ILLNESS:  The patient is a 65 year old white male with past medical history significant for coronary artery disease, status post PCI with 2.25 x 16 mm Ion stent to the posterior lateral branch of the right coronary artery in September 2011 and status post PCI of the LAD and diagonal in September 2009, hypertension, sarcoidosis, diabetes, obstructive sleep apnea, hyperlipidemia, who is presenting a 1-day history of substernal chest discomfort.  The patient states the pain has been present for over 24 hours.  It has not remitted.  It is sometimes worse with deep inspiration.  It does not radiate.  He has had increased shortness of breath over the past week, but he is attributed that to increased heat.  In the emergency room, the pain was improved with nitroglycerin and morphine.  The patient has been compliant with his medications.  PAST MEDICAL HISTORY:  As above in HPI.  SOCIAL HISTORY:  No tobacco, no alcohol.  He works as a Investment banker, corporate.  FAMILY HISTORY:  Noncontributory.  ALLERGIES:  No known drug allergies.  MEDICATIONS: 1. Aspirin 325 daily. 2. Plavix 75 mg daily. 3. Losartan 100 mg daily. 4. Lasix 80 mg b.i.d. 5. Prednisone 10 mg daily. 6. Glipizide 10 mg daily. 7. Imdur 30 mg daily.  REVIEW OF SYSTEMS:  The patient endorses irritating bug bite that caused bleeding on his chest yesterday.  He also endorses some chronic mild lower extremity edema and chronic mild dyspnea.  He uses CPAP at home. Other systems as in HPI, otherwise negative.  PHYSICAL EXAMINATION:  VITAL SIGNS:  Temperature 98.8,  heart rate 84, respiratory rate 20, blood pressure 144/64, and satting 93% on room air. GENERAL:  No acute distress. HEENT:  Normocephalic and atraumatic. NECK:  Supple. HEART:  Regular rate and rhythm without murmur, rub, or gallop. LUNGS:  Crackles at the right base. ABDOMEN:  Soft, mild epigastric tenderness.  No rebound or guarding. EXTREMITIES:  1+ bilateral lower extremity edema. SKIN:  Warm and dry. NEURO:  Grossly nonfocal. PSYCHIATRIC:  The patient is appropriate.  LABORATORY DATA:  Sodium 136, potassium 3.9, chloride 102, CO2 of 16, creatinine 1.0, glucose 221.  White count 14, hemoglobin 15, hematocrit 42, platelet count 233, CK 104, CK-MB 3.1, troponin less than 0.30 x2. EKG normal sinus rhythm with poor R-wave progression.  Chest x-ray shows interstitial thickening and mild pulmonary edema.  ASSESSMENT:  A 65 year old gentleman with a known history of coronary artery disease, presenting with some typical and some atypical features of angina.  In fact the pain is present for approximately 24 hours with normal cardiac markers decreases the chance of this is truly acute coronary syndrome.  However, on nitroglycerin did help pain in the emergency room.  PLAN:  The patient is  admitted to telemetry and will be ruled out for myocardial infarction.  His home medications will be continued with the exception of his diabetic medications.  In the setting, we will place on sliding scale insulin.  He does appear mildly volume overloaded.  I will admit dose of 80 mg IV Lasix and then put him on 40 mg IV twice a day. He may need additional dosing of the 40 mg IV twice a day is probably proven to his home dose.  He will be made n.p.o. for an ischemia evaluation.  If he rules out myocardial infarction, a stress test would likely be a reasonable next step.  He will be given sliding scale insulin, CPAP machine, and Lovenox for DVT prophylaxis.     Brayton El,  MD     SGA/MEDQ  D:  11/09/2010  T:  11/09/2010  Job:  [id number]  Electronically Signed by Raynelle Bring MD on 11/28/2010 09:59:50 AM

## 2010-12-06 LAB — COMPREHENSIVE METABOLIC PANEL
Albumin: 3.4 — ABNORMAL LOW
BUN: 11
Chloride: 96
Creatinine, Ser: 0.95
GFR calc non Af Amer: >60
Total Bilirubin: 0.7

## 2010-12-06 LAB — DIFFERENTIAL
Basophils Absolute: 0.1
Eosinophils Absolute: 0.5
Lymphocytes Relative: 10 — ABNORMAL LOW
Lymphs Abs: 1.9
Monocytes Absolute: 1.1 — ABNORMAL HIGH
Monocytes Relative: 11
Neutro Abs: 8.9 — ABNORMAL HIGH
Neutrophils Relative %: 59

## 2010-12-06 LAB — POCT CARDIAC MARKERS
CKMB, poc: 1.3
CKMB, poc: 1.5
Myoglobin, poc: 76.6
Myoglobin, poc: 81.5
Troponin i, poc: <0.05

## 2010-12-06 LAB — CBC
HCT: 40
MCV: 93.7
MCV: 94.5
Platelets: 330
RBC: 4.21 — ABNORMAL LOW
WBC: 11.4 — ABNORMAL HIGH
WBC: 8.3

## 2010-12-06 LAB — GLUCOSE, CAPILLARY
Glucose-Capillary: 138 — ABNORMAL HIGH
Glucose-Capillary: 143 — ABNORMAL HIGH
Glucose-Capillary: 251 — ABNORMAL HIGH

## 2010-12-06 LAB — SAMPLE TO BLOOD BANK

## 2010-12-06 LAB — BASIC METABOLIC PANEL
Chloride: 104
Creatinine, Ser: 0.99
GFR calc Af Amer: >60
Potassium: 4.4

## 2010-12-06 LAB — HEMOGLOBIN A1C: Mean Plasma Glucose: 226

## 2010-12-08 LAB — CBC
Hemoglobin: 12.6 — ABNORMAL LOW
Platelets: 254
Platelets: 261
RDW: 13.7
RDW: 13.8
WBC: 9.3

## 2010-12-08 LAB — BASIC METABOLIC PANEL
BUN: 8
Calcium: 8.4
Creatinine, Ser: 0.88
GFR calc non Af Amer: >60

## 2010-12-08 LAB — GLUCOSE, CAPILLARY
Glucose-Capillary: 180 — ABNORMAL HIGH
Glucose-Capillary: 181 — ABNORMAL HIGH
Glucose-Capillary: 197 — ABNORMAL HIGH
Glucose-Capillary: 221 — ABNORMAL HIGH

## 2010-12-14 ENCOUNTER — Encounter: Payer: Self-pay | Admitting: Gastroenterology

## 2010-12-14 ENCOUNTER — Ambulatory Visit (INDEPENDENT_AMBULATORY_CARE_PROVIDER_SITE_OTHER): Payer: BC Managed Care – PPO | Admitting: Gastroenterology

## 2010-12-14 DIAGNOSIS — R1013 Epigastric pain: Secondary | ICD-10-CM

## 2010-12-14 DIAGNOSIS — I2510 Atherosclerotic heart disease of native coronary artery without angina pectoris: Secondary | ICD-10-CM

## 2010-12-14 NOTE — Assessment & Plan Note (Signed)
Stable. Patient is on Plavix

## 2010-12-14 NOTE — Patient Instructions (Signed)
Your appointment is scheduled with [facility] to see Dr Lodema Pilot on 12/30/2010 at 1pm

## 2010-12-14 NOTE — Progress Notes (Signed)
[name] is a 65 year old white male with diabetes, sarcoidosis, coronary artery disease and obstructive sleep apnea referred at the request of Dr. Tiburcio Pea for evaluation of abdominal pain. In early September he was admitted with severe upper abdominal and chest pain. CT angiogram of the chest was negative. Nuclear stress test showed an ejection fraction of 56% but no evidence for ischemia. Abdominal ultrasound, which I reviewed, demonstrated multiple gallbladder stones and a distended gallbladder. Since that time he has complained of postprandial upper abdominal discomfort although not of the intensity that brought him to the hospital.  The patient takes prednisone 10 mg every other day and is on Plavix.      Past Medical History  Diagnosis Date  . Type 2 diabetes mellitus   . GERD (gastroesophageal reflux disease)   . Sarcoid   . Coronary artery disease   . Adrenal insufficiency   . OSA (obstructive sleep apnea)   . Hypertension    Past Surgical History  Procedure Date  . Coronary stent placement 2009    in LAD and side branch PTCA  . Cataract extraction 2011    bilat  . Intercostal nerve block 2011    x2. lumbar spine  . Appendectomy     reports that he has never smoked. He has never used smokeless tobacco. He reports that he does not drink alcohol or use illicit drugs. family history includes Asthma in his sister; Diabetes in his mother; Heart attack in his father; Kidney cancer in his mother; and Kidney disease in his mother.  Current medications and social history were reviewed in [employer] [facility] electronic medical record  Review of Systems: Pertinent positive and negative review of systems were noted in the above HPI section. All other review of systems were otherwise negative.  Vital signs were reviewed in today's medical record Physical Exam: General: Well developed , well nourished, no acute distress Head: Normocephalic and atraumatic Eyes:  sclerae anicteric,  EOMI Ears: Normal auditory acuity Mouth: No deformity or lesions Neck: Supple, no masses or thyromegaly Lungs: Clear throughout to auscultation Heart: Regular rate and rhythm; no murmurs, rubs or bruits Abdomen: Soft, non tender and non distended. No masses, hepatosplenomegaly or hernias noted. Normal Bowel sounds Rectal:no rectal masses; stool heme negative Musculoskeletal: Symmetrical with no gross deformities  Skin: No lesions on visible extremities Pulses:  Normal pulses noted Extremities: No clubbing, cyanosis, edema or deformities noted Neurological: Alert oriented x 4, grossly nonfocal Cervical Nodes:  No significant cervical adenopathy Inguinal Nodes: No significant inguinal adenopathy Psychological:  Alert and cooperative. Normal mood and affect

## 2010-12-14 NOTE — Assessment & Plan Note (Addendum)
I strongly suspect that the patient suffers from chronic cholecystitis.  Normal LFTs (ALT 57)  mitigate against choledocholithiasis.  Recommendations #1 referred to surgery for consideration of cholecystectomy

## 2010-12-15 ENCOUNTER — Encounter: Payer: Self-pay | Admitting: Gastroenterology

## 2010-12-29 ENCOUNTER — Encounter (INDEPENDENT_AMBULATORY_CARE_PROVIDER_SITE_OTHER): Payer: BC Managed Care – PPO | Admitting: Ophthalmology

## 2010-12-29 DIAGNOSIS — H43819 Vitreous degeneration, unspecified eye: Secondary | ICD-10-CM

## 2010-12-29 DIAGNOSIS — E11319 Type 2 diabetes mellitus with unspecified diabetic retinopathy without macular edema: Secondary | ICD-10-CM

## 2010-12-30 ENCOUNTER — Ambulatory Visit (INDEPENDENT_AMBULATORY_CARE_PROVIDER_SITE_OTHER): Payer: BC Managed Care – PPO | Admitting: General Surgery

## 2010-12-30 ENCOUNTER — Encounter (INDEPENDENT_AMBULATORY_CARE_PROVIDER_SITE_OTHER): Payer: Self-pay

## 2010-12-30 ENCOUNTER — Encounter (INDEPENDENT_AMBULATORY_CARE_PROVIDER_SITE_OTHER): Payer: Self-pay | Admitting: General Surgery

## 2010-12-30 VITALS — BP 140/80 | HR 68 | Temp 97.6°F | Resp 16 | Ht 70.5 in | Wt 241.2 lb

## 2010-12-30 DIAGNOSIS — K8020 Calculus of gallbladder without cholecystitis without obstruction: Secondary | ICD-10-CM

## 2010-12-30 NOTE — Progress Notes (Signed)
Chief Complaint  Patient presents with  . Other    new pt- eval GB    HPI [name] is a 65 y.o. male.  This patient is referred by Dr. Arlyce Dice for evaluation of possible symptomatic cholelithiasis. He has a consultative medical history with diabetes, myocardial infarction, sarcoidosis and pulmonary problems requiring daily prednisone. He presents today for evaluation of right upper quadrant abdominal pain and nausea which has been present since September. He was at the beach and after eating doughnuts he began having epigastric, upper abdominal, and chest pains with associated nausea and vomiting. He felt like he was having a repeat heart attack and he presented to the emergency room for evaluation. He was admitted to the hospital for approximately 48 hours and had multiple cardiac tests which were negative. He has a history of heartburn as well as if he has symptoms approximately 2-3 times a week but does not take any antacid routinely but on a p.r.n. basis. He takes TUMS a couple times per week. This abdominal pain that he is having also occurs approximately 2-3 times per week and is located mainly in the right upper quadrant and radiates to the back. He has occasional nausea and food intolerance associated with this. He states his symptoms are worse with bacon, Bar-B-Q, and doughnuts but denies any fevers or chills. He has occasional loose stools and move his bowels approximately 2-3 times per day but denies any blood in the stools or melena. He states that his current of his colonoscopy had a recent one a few years ago. He is currently on Plavix and is followed by Dr. Anne Fu at [facility] cardiology.He had an ultrasound of the abdomen which demonstrated layering gallstones and a common bile duct 6 mm. HPI  Past Medical History  Diagnosis Date  . Type 2 diabetes mellitus   . GERD (gastroesophageal reflux disease)   . Sarcoid   . Coronary artery disease   . Adrenal insufficiency   . OSA (obstructive  sleep apnea)   . Hypertension   . Diabetes mellitus   . Heart attack 2010    Past Surgical History  Procedure Date  . Coronary stent placement 2009    in LAD and side branch PTCA  . Cataract extraction 2011    bilat  . Intercostal nerve block 2011    x2. lumbar spine  . Appendectomy     Family History  Problem Relation Age of Onset  . Asthma Sister   . Kidney disease Mother   . Diabetes Mother   . Kidney cancer Mother   . Cancer Mother     kidney  . Heart attack Father     Social History History  Substance Use Topics  . Smoking status: Never Smoker   . Smokeless tobacco: Never Used  . Alcohol Use: No    Allergies  Allergen Reactions  . Hydrocortisone Nausea Only  . Statins Other (See Comments)    Pt says they make him "mean"    Current Outpatient Prescriptions  Medication Sig Dispense Refill  . aspirin 81 MG tablet Take 81 mg by mouth daily.        . clopidogrel (PLAVIX) 75 MG tablet Take 75 mg by mouth daily.        Marland Kitchen COENZYME Q-10 PO Take 1 tablet by mouth daily.        Marland Kitchen ezetimibe (ZETIA) 10 MG tablet Take 10 mg by mouth daily.        . fish oil-omega-3  fatty acids 1000 MG capsule Take 1 g by mouth daily.       . furosemide (LASIX) 80 MG tablet Take 80 mg by mouth 2 (two) times daily.        Marland Kitchen glipiZIDE (GLUCOTROL) 10 MG tablet Take 20 mg by mouth 2 (two) times daily.        . insulin aspart protamine-insulin aspart (NOVOLOG 70/30) (70-30) 100 UNIT/ML injection Inject 40 Units into the skin 3 (three) times daily.        . isosorbide mononitrate (IMDUR) 30 MG 24 hr tablet Take 30 mg by mouth daily.        Marland Kitchen KLOR-CON M10 10 MEQ tablet TAKE 2 TABLETS BY MOUTH TWICE A DAY  360 tablet  0  . losartan (COZAAR) 100 MG tablet TAKE 1 TABLET BY MOUTH EVERY DAY  30 tablet  2  . Multiple Vitamin (MULTIVITAMIN) capsule Take 1 capsule by mouth daily.        . predniSONE (DELTASONE) 10 MG tablet Take 10 mg by mouth daily. As directed         Review of Systems Review of  Systems  Constitutional: Negative.   HENT: Negative.   Eyes: Negative.   Respiratory: Positive for chest tightness and shortness of breath.   Cardiovascular: Positive for leg swelling.  Gastrointestinal: Positive for nausea, vomiting, abdominal pain, diarrhea and abdominal distention.  Genitourinary: Negative.   Musculoskeletal: Negative.   Skin: Negative.   Neurological: Negative.   Hematological: Negative.   Psychiatric/Behavioral: Negative.   All other systems reviewed and are negative.    Blood pressure 140/80, pulse 68, temperature 97.6 F (36.4 C), temperature source Temporal, resp. rate 16, height 5' 10.5" (1.791 m), weight 241 lb 3.2 oz (109.408 kg).  Physical Exam Physical Exam  Vitals reviewed. Constitutional: He is oriented to person, place, and time. He appears well-developed and well-nourished. No distress.  HENT:  Head: Normocephalic and atraumatic.  Mouth/Throat: No oropharyngeal exudate.  Eyes: Conjunctivae and EOM are normal. Pupils are equal, round, and reactive to light. Right eye exhibits no discharge. Left eye exhibits no discharge. No scleral icterus.  Neck: Normal range of motion. Neck supple. No tracheal deviation present.  Cardiovascular: Normal rate, regular rhythm and normal heart sounds.   Pulmonary/Chest: Effort normal and breath sounds normal. No stridor. No respiratory distress. He has no wheezes.  Abdominal: Soft. Bowel sounds are normal. He exhibits no distension and no mass. There is no tenderness. There is no rebound and no guarding.  Musculoskeletal: Normal range of motion. He exhibits no edema.  Neurological: He is alert and oriented to person, place, and time.  Skin: Skin is warm and dry. No rash noted. He is not diaphoretic. No erythema. No pallor.  Psychiatric: He has a normal mood and affect. His behavior is normal. Judgment and thought content normal.    Data Reviewed [place], labs, and cardiac eval  Assessment    Abdominal pain and  cholelithiasis  I certainly think that his symptoms could be related to his cholelithiasis although I did discuss with him other possibilities such as peptic ulcer disease and reflux as well. Given his ultrasound in his symptoms and his food intolerance, had offered him cholecystectomy for possible relief. I discussed with him the options for continued observation versus surgical removal of his gallbladder with the risks and benefits of each. He would like to have surgery given his frequent symptoms. I discussed with him the risks of infection, bleeding, pain, scarring, persistent symptoms,  diarrhea, injury to bowel or bile ducts, pulmonary or cardiac problems associated with the surgery especially given his other comorbidities. He expressed understanding and desires to proceed with cholecystectomy.  Coronary artery disease  I recommended that he visit with Dr. Anne Fu to receive cardiac clearance prior to his procedure and to ensure that he can come off of his Plavix for an elective procedure. We will set him up to see Dr. Anne Fu and then schedule his surgery.  Sarcoidosis and adrenal insufficiency  We will also set him up for anesthesia consultation to evaluate for his pulmonary status and steroid dependency. He will likely need stress dose steroids.    Plan    Cardiac and anesthesia evaluation prior to surgery. We will schedule his surgery after these consultations.       Lodema Pilot DAVID 12/30/2010, 1:58 PM

## 2010-12-31 ENCOUNTER — Other Ambulatory Visit: Payer: Self-pay | Admitting: Critical Care Medicine

## 2011-01-10 ENCOUNTER — Other Ambulatory Visit (INDEPENDENT_AMBULATORY_CARE_PROVIDER_SITE_OTHER): Payer: Self-pay | Admitting: General Surgery

## 2011-01-12 ENCOUNTER — Other Ambulatory Visit (INDEPENDENT_AMBULATORY_CARE_PROVIDER_SITE_OTHER): Payer: Self-pay | Admitting: General Surgery

## 2011-01-17 ENCOUNTER — Encounter (HOSPITAL_COMMUNITY): Payer: Self-pay | Admitting: Pharmacy Technician

## 2011-01-18 ENCOUNTER — Encounter (HOSPITAL_COMMUNITY)
Admission: RE | Admit: 2011-01-18 | Discharge: 2011-01-18 | Disposition: A | Discharge status: Home / Self Care | Payer: BC Managed Care – PPO | Source: Ambulatory Visit | Attending: Anesthesiology | Admitting: Anesthesiology

## 2011-01-18 ENCOUNTER — Encounter (HOSPITAL_COMMUNITY)
Admission: RE | Admit: 2011-01-18 | Discharge: 2011-01-18 | Disposition: A | Discharge status: Home / Self Care | Payer: BC Managed Care – PPO | Source: Ambulatory Visit | Attending: General Surgery | Admitting: General Surgery

## 2011-01-18 ENCOUNTER — Encounter (HOSPITAL_COMMUNITY): Payer: Self-pay

## 2011-01-18 HISTORY — DX: Pneumonia, unspecified organism: J18.9

## 2011-01-18 LAB — CBC
HCT: 42.2 % (ref 39.0–52.0)
Platelets: 254 10*3/uL (ref 150–400)
RBC: 4.36 MIL/uL (ref 4.22–5.81)
RDW: 13.2 % (ref 11.5–15.5)
WBC: 13.1 10*3/uL — ABNORMAL HIGH (ref 4.0–10.5)

## 2011-01-18 LAB — BASIC METABOLIC PANEL
Calcium: 10 mg/dL (ref 8.4–10.5)
Chloride: 100 mEq/L (ref 96–112)
Creatinine, Ser: 1.25 mg/dL (ref 0.50–1.35)
GFR calc Af Amer: 68 mL/min — ABNORMAL LOW (ref >90)
Sodium: 140 mEq/L (ref 135–145)

## 2011-01-18 LAB — SURGICAL PCR SCREEN: Staphylococcus aureus: POSITIVE — AB

## 2011-01-18 IMAGING — CR DG CHEST 2V
2 series · 2 of 2 positions shown · non-contrast
Comparison: CT chest [DATE] and chest radiograph [DATE]

CLINICAL DATA: Hypertension and sarcoidosis.  Cholecystectomy.
Coronary artery disease and sleep apnea.

CHEST - 2 VIEW

[view not recorded (1 of 2)]
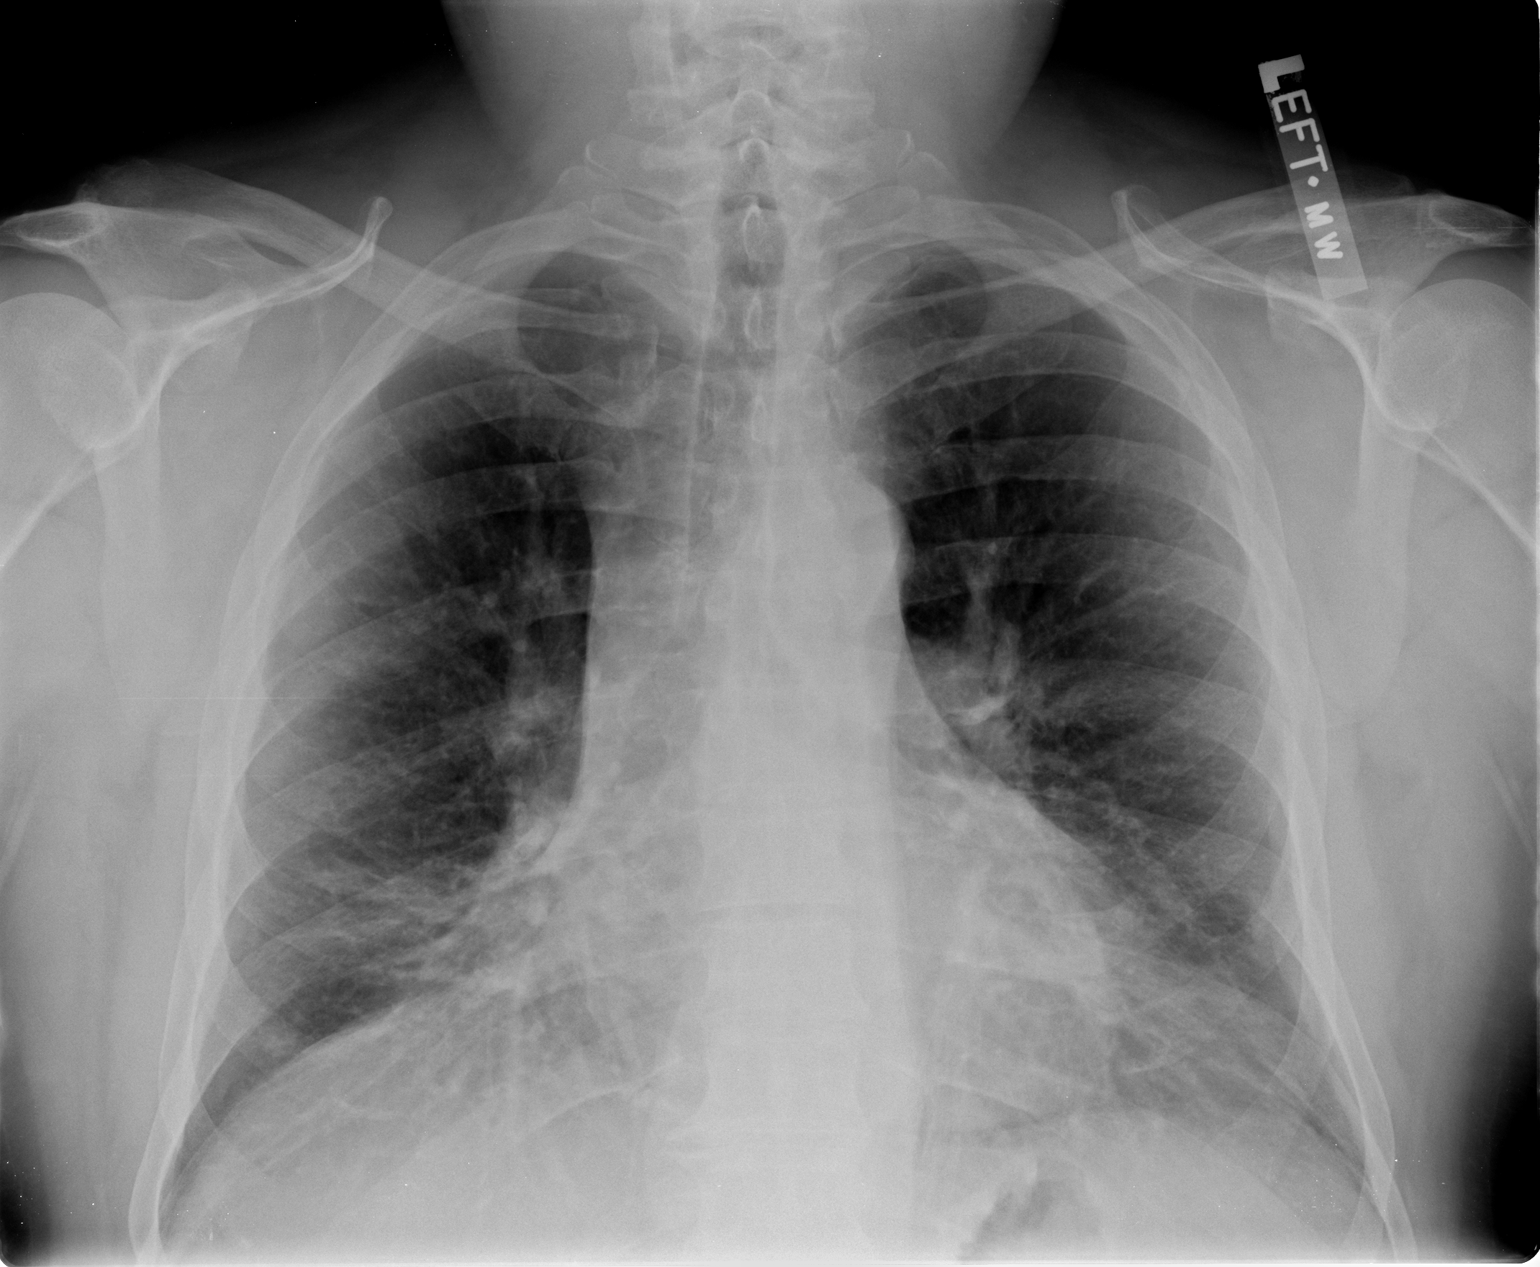

[view not recorded (2 of 2)]
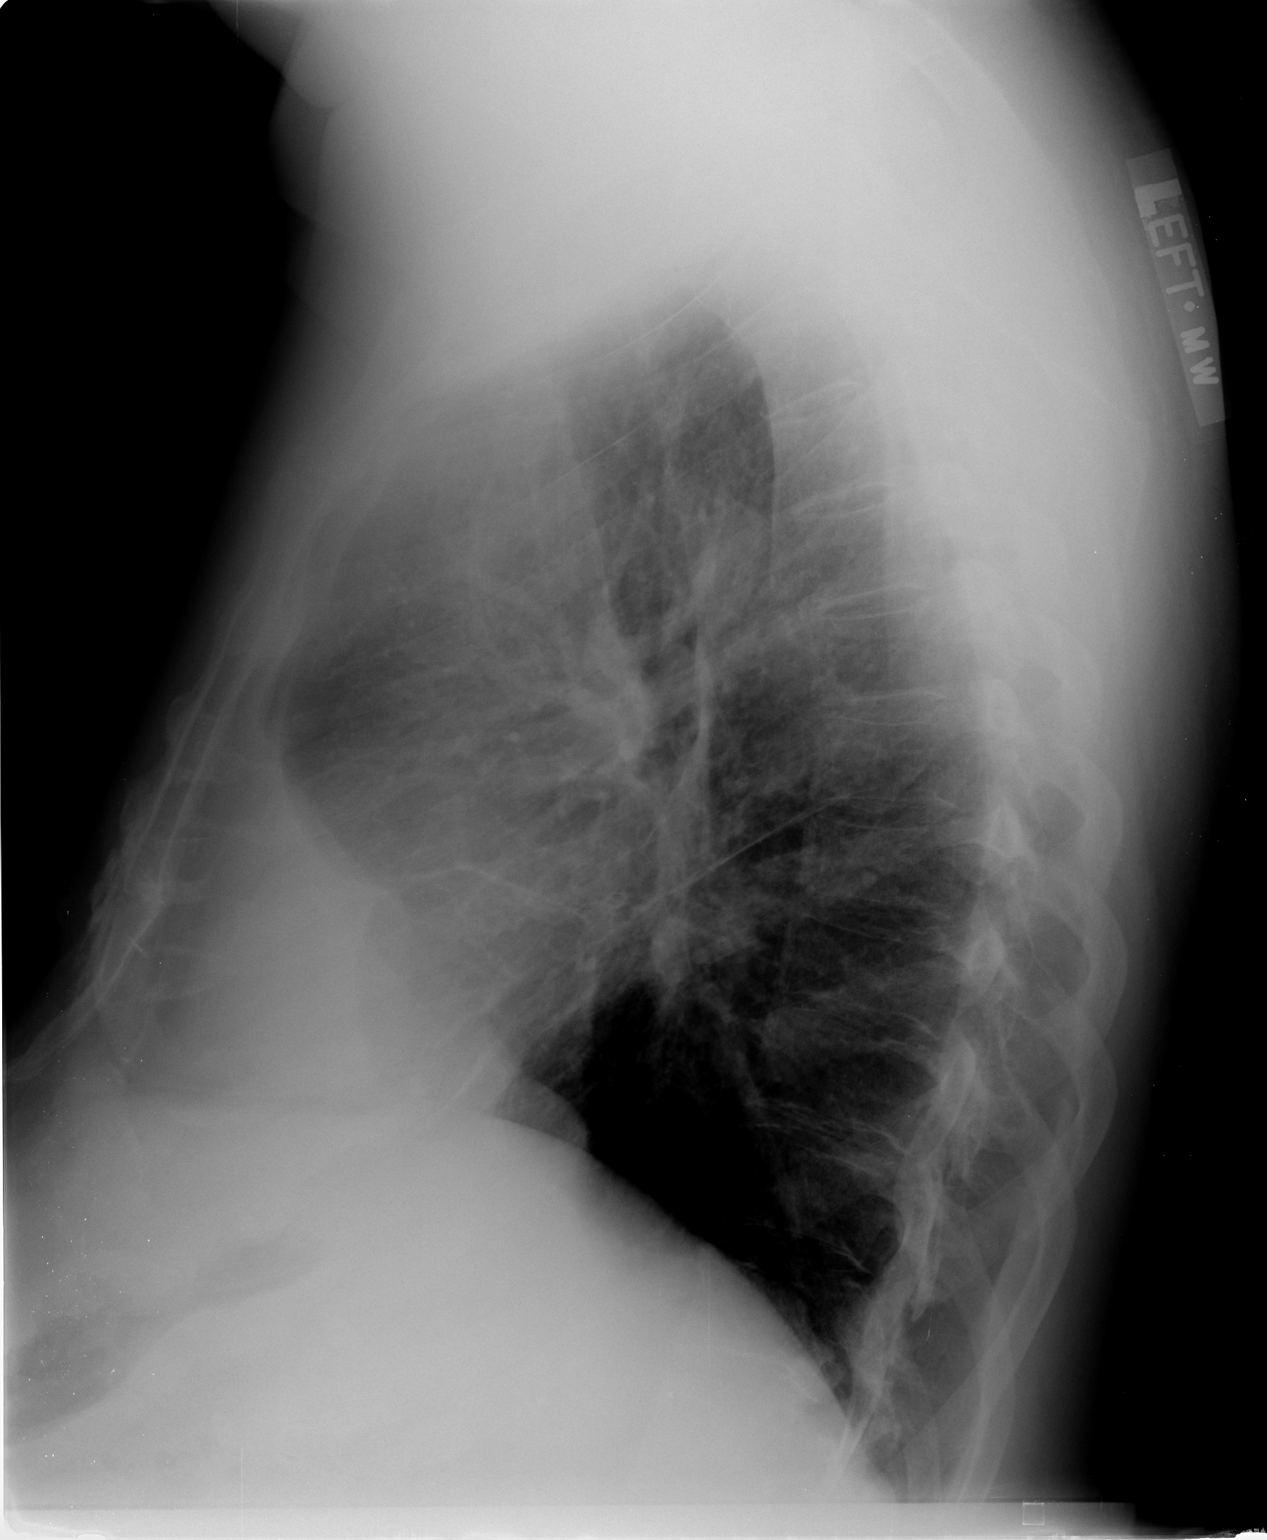

[2 of 2 positions shown; findings below may reference images not displayed]

FINDINGS: The heart is mildly enlarged and stable.  Prominent
epicardial fat is stable.  Stable chronic prominence of the
interstitial lung markings at the lung bases is compatible with
provided history of sarcoidosis. Vascular congestion appears
similar to prior exam.  No evidence of lymphadenopathy. No focal
airspace disease or pleural effusion.  No acute or suspicious bony
abnormality identified.
IMPRESSION: 1.  Stable exam.  Chronic interstitial prominence likely related to
a history of sarcoidosis.
2.  Probable vascular congestion.  Negative for edema.

## 2011-01-18 NOTE — Progress Notes (Signed)
Requested sleep study and all cardiac records from Dr. Anne Fu office. Pt reports most recent cath Sept. Last year. Pt to see [facility] for anes. consult prior to leaving PAT visit per Dr. Biagio Quint.

## 2011-01-18 NOTE — Pre-Procedure Instructions (Signed)
20 [name]  01/18/2011   Your procedure is scheduled on: January 25, 2011  Report to [facility] at 0530 AM.  Call this number if you have problems the morning of surgery: [phone]   Remember:   Do not eat food:After Midnight.  Do not drink clear liquids Soda, tea, black coffee (0130): 4 Hours before arrival.  Take these medicines the morning of surgery with A SIP OF WATER: Imdur   Do not wear jewelry, make-up or nail polish.  Do not wear lotions, powders, or perfumes. You may wear deodorant.  Do not shave 48 hours prior to surgery.  Do not bring valuables to the hospital.  Contacts, dentures or bridgework may not be worn into surgery.  Leave suitcase in the car. After surgery it may be brought to your room.  For patients admitted to the hospital, checkout time is 11:00 AM the day of discharge.   Patients discharged the day of surgery will not be allowed to drive home.  Name and phone number of your driver: [name]  Special Instructions: CHG Shower Use Special Wash: 1/2 bottle night before surgery and 1/2 bottle morning of surgery.   Please read over the following fact sheets that you were given: Pain Booklet and Coughing and Deep Breathing

## 2011-01-19 NOTE — Anesthesia Preprocedure Evaluation (Addendum)
Anesthesia Evaluation  Patient identified by MRN, date of birth, ID band Patient awake    Reviewed: Allergy & Precautions, H&P , NPO status , Patient's Chart, lab work & pertinent test results  Airway Mallampati: III TM Distance: <3 FB Neck ROM: Full    Dental  (+) Edentulous Upper and Teeth Intact   Pulmonary sleep apnea and Continuous Positive Airway Pressure Ventilation , pneumonia ,  Severe pulmonary Sarcoidosis, steroid dependent Severe OSA clear to auscultation        Cardiovascular hypertension, Pt. on medications + CAD, + Past MI and + Cardiac Stents regular Normal Stents to LAD, posterolateral Neg stress 11/09/10 with EF 56%   Neuro/Psych    GI/Hepatic GERD-  Controlled,  Endo/Other  Diabetes mellitus-, Well Controlled, Type 1, Insulin Dependent and Oral Hypoglycemic Agents  Renal/GU      Musculoskeletal   Abdominal   Peds  Hematology   Anesthesia Other Findings   Reproductive/Obstetrics                        Anesthesia Physical Anesthesia Plan  ASA: III  Anesthesia Plan: General   Post-op Pain Management:    Induction: Intravenous  Airway Management Planned: Oral ETT  Additional Equipment:   Intra-op Plan:   Post-operative Plan: Extubation in OR  Informed Consent: I have reviewed the patients History and Physical, chart, labs and discussed the procedure including the risks, benefits and alternatives for the proposed anesthesia with the patient or authorized representative who has indicated his/her understanding and acceptance.     Plan Discussed with: CRNA  Anesthesia Plan Comments:         Anesthesia Quick Evaluation

## 2011-01-19 NOTE — Consult Note (Signed)
Anesthesia:  65 year old male for lap chole on 01/25/11.  I saw him on 01/18/11 during his PAT visit.  Hx + for Sarcoidosis, DM type 2, GERD, OSA, adrenal insufficiency, HTN, MI 3-4 years ago, CAD s/p LAD stent then DES to posterolateral artery 11/2009.  His Cardiologist is Dr. Anne Fu.  He was evaluated recently for CP and N/V and found to have a negative cardiac w/u.  Stress test was done on 11/09/10 showing no ischemia with EF 56%.  It was felt his symptoms may be GB related.  His last echo from [facility] in August 2009 showed mild asymmetric septal hypertrophy, EF 55-60%, Grade 1 diastolic dysfunction, and normal RV size and function.  His last cath was 10/15/09 followed by percutaneous intervention to RCA on 11/11/09.  He has had no further chest pain.  I have reviewed his EKG from 11/10/10 showing NSR with LAFB.  Labs also noted.  In regards to his pulmonary status, he is on chronic Prednisone for his severe pulmonary sarcoidosis.  He sees Dr. Delford Field with last visit 09/30/10 which also lists last PFT results from 2010.  He has had no recent exacerbations.  He also has severe OSA and uses "VPAP" at night.  His sleep study from [facility] in on his chart.  He denies any known difficulty with intubations in the past.  I would say his Mallampati classification is a III. He has an upper plate.  His lungs sounds were clear.  He feels he is breathing at his baseline.  Denies SOB at rest, but has +DOE after approximately 15 minutes of moderate activity which he feels is his baseline.   The 01/18/11 CXR findings show interstitial prominence likely r/t his hx of Sarcoidosis and probably vascular congestion without edema.  The Anesthesiologist will evaluate him on the day of surgery to discuss the definitive anesthesia plan.  He may need stress dose steroids.

## 2011-01-20 ENCOUNTER — Other Ambulatory Visit: Payer: Self-pay | Admitting: Critical Care Medicine

## 2011-01-24 MED ORDER — CEFAZOLIN SODIUM-DEXTROSE 2-3 GM-% IV SOLR
2.0000 g | Freq: Once | INTRAVENOUS | Status: AC
  Administered 2011-01-25: 2 g via INTRAVENOUS
  Filled 2011-01-24: qty 50

## 2011-01-25 ENCOUNTER — Encounter (HOSPITAL_COMMUNITY): Payer: Self-pay | Admitting: *Deleted

## 2011-01-25 ENCOUNTER — Encounter (HOSPITAL_COMMUNITY): Payer: Self-pay | Admitting: Vascular Surgery

## 2011-01-25 ENCOUNTER — Other Ambulatory Visit (INDEPENDENT_AMBULATORY_CARE_PROVIDER_SITE_OTHER): Payer: Self-pay | Admitting: General Surgery

## 2011-01-25 ENCOUNTER — Ambulatory Visit (HOSPITAL_COMMUNITY): Payer: BC Managed Care – PPO | Admitting: Vascular Surgery

## 2011-01-25 ENCOUNTER — Ambulatory Visit (HOSPITAL_COMMUNITY): Payer: BC Managed Care – PPO

## 2011-01-25 ENCOUNTER — Inpatient Hospital Stay (HOSPITAL_COMMUNITY)
Admission: RE | Admit: 2011-01-25 | Discharge: 2011-01-26 | DRG: 493 | Disposition: A | Discharge status: Home / Self Care | Payer: BC Managed Care – PPO | Source: Ambulatory Visit | Attending: General Surgery | Admitting: General Surgery

## 2011-01-25 ENCOUNTER — Encounter (HOSPITAL_COMMUNITY)
Admission: RE | Disposition: A | Discharge status: Home / Self Care | Payer: Self-pay | Source: Ambulatory Visit | Attending: General Surgery

## 2011-01-25 DIAGNOSIS — Z9849 Cataract extraction status, unspecified eye: Secondary | ICD-10-CM

## 2011-01-25 DIAGNOSIS — E119 Type 2 diabetes mellitus without complications: Secondary | ICD-10-CM | POA: Diagnosis present

## 2011-01-25 DIAGNOSIS — Z79899 Other long term (current) drug therapy: Secondary | ICD-10-CM

## 2011-01-25 DIAGNOSIS — Z9861 Coronary angioplasty status: Secondary | ICD-10-CM

## 2011-01-25 DIAGNOSIS — K828 Other specified diseases of gallbladder: Secondary | ICD-10-CM | POA: Diagnosis present

## 2011-01-25 DIAGNOSIS — R1013 Epigastric pain: Secondary | ICD-10-CM

## 2011-01-25 DIAGNOSIS — IMO0002 Reserved for concepts with insufficient information to code with codable children: Secondary | ICD-10-CM

## 2011-01-25 DIAGNOSIS — G4733 Obstructive sleep apnea (adult) (pediatric): Secondary | ICD-10-CM | POA: Diagnosis present

## 2011-01-25 DIAGNOSIS — I10 Essential (primary) hypertension: Secondary | ICD-10-CM | POA: Diagnosis present

## 2011-01-25 DIAGNOSIS — K8000 Calculus of gallbladder with acute cholecystitis without obstruction: Secondary | ICD-10-CM

## 2011-01-25 DIAGNOSIS — Z794 Long term (current) use of insulin: Secondary | ICD-10-CM

## 2011-01-25 DIAGNOSIS — I2510 Atherosclerotic heart disease of native coronary artery without angina pectoris: Secondary | ICD-10-CM | POA: Diagnosis present

## 2011-01-25 DIAGNOSIS — I252 Old myocardial infarction: Secondary | ICD-10-CM

## 2011-01-25 DIAGNOSIS — R209 Unspecified disturbances of skin sensation: Secondary | ICD-10-CM | POA: Diagnosis not present

## 2011-01-25 DIAGNOSIS — Z7982 Long term (current) use of aspirin: Secondary | ICD-10-CM

## 2011-01-25 DIAGNOSIS — E2749 Other adrenocortical insufficiency: Secondary | ICD-10-CM | POA: Diagnosis present

## 2011-01-25 HISTORY — PX: CHOLECYSTECTOMY: SHX55

## 2011-01-25 LAB — CBC
HCT: 39.2 % (ref 39.0–52.0)
Hemoglobin: 13.6 g/dL (ref 13.0–17.0)
RBC: 4.1 MIL/uL — ABNORMAL LOW (ref 4.22–5.81)
WBC: 16 10*3/uL — ABNORMAL HIGH (ref 4.0–10.5)

## 2011-01-25 LAB — GLUCOSE, CAPILLARY
Glucose-Capillary: 126 mg/dL — ABNORMAL HIGH (ref 70–99)
Glucose-Capillary: 225 mg/dL — ABNORMAL HIGH (ref 70–99)
Glucose-Capillary: 255 mg/dL — ABNORMAL HIGH (ref 70–99)

## 2011-01-25 IMAGING — RF DG CHOLANGIOGRAM OPERATIVE
1 series · 10 of 10 positions shown · non-contrast
Comparison: none

CLINICAL DATA: cholelithiasis

INTRAOPERATIVE CHOLANGIOGRAM
TECHNIQUE: Cholangiographic images from the C-arm fluoroscopic
device were submitted for interpretation post-operatively.  Please
see the procedural report for the amount of contrast and the
fluoroscopy time utilized.

[Series 1: run · 4 acquisitions, 10 frames shown]
[im 1/4]
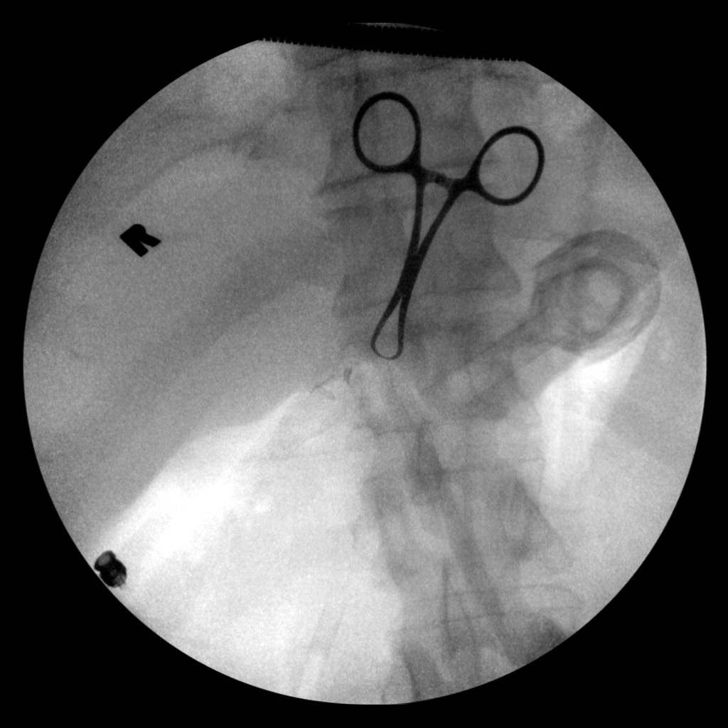
[im 1/4]
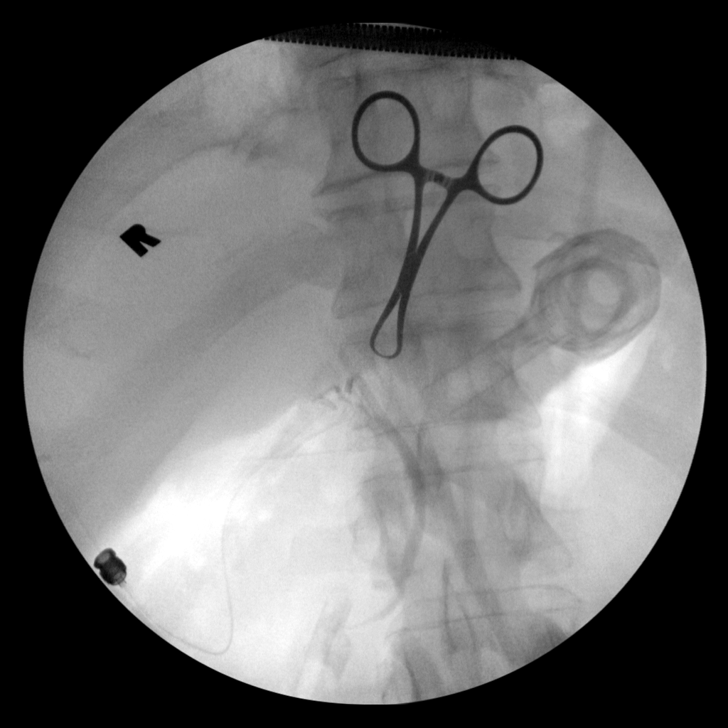
[im 1/4]
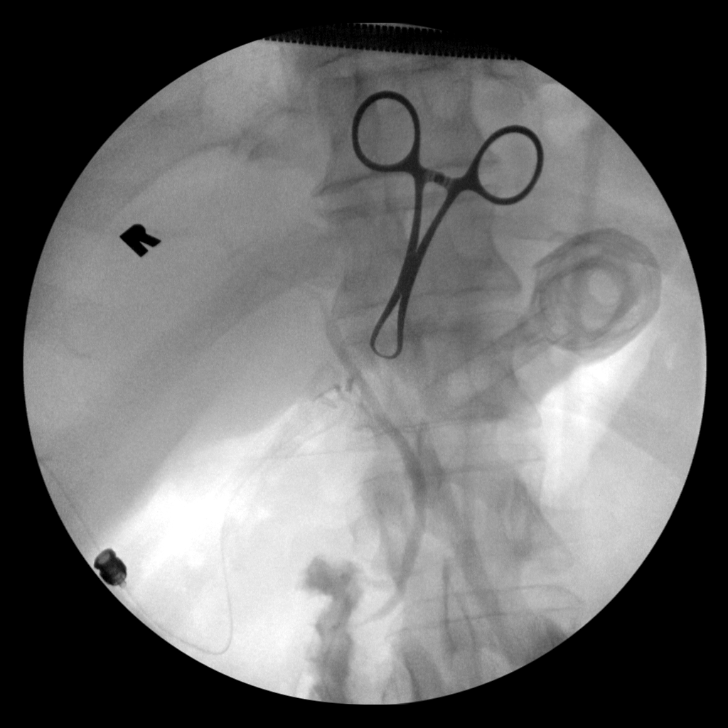
[im 1/4]
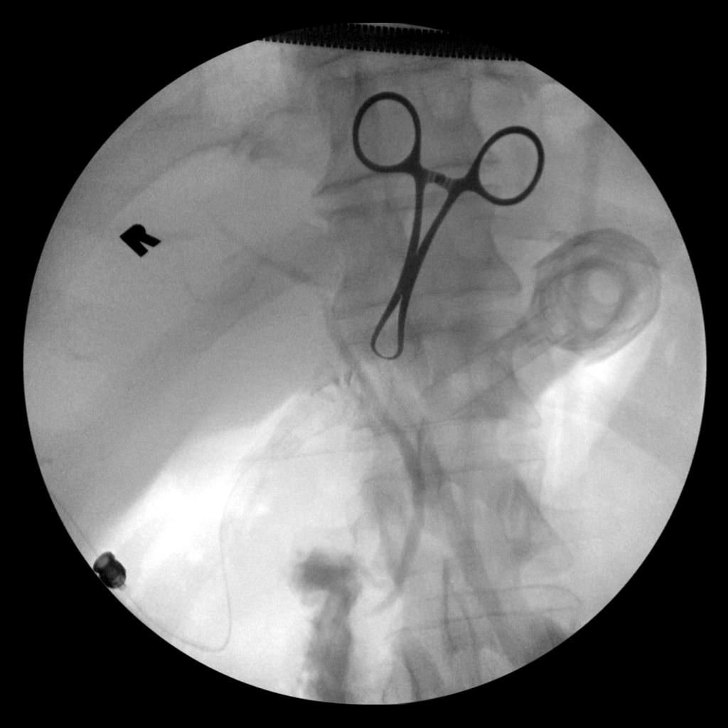
[im 2/4]
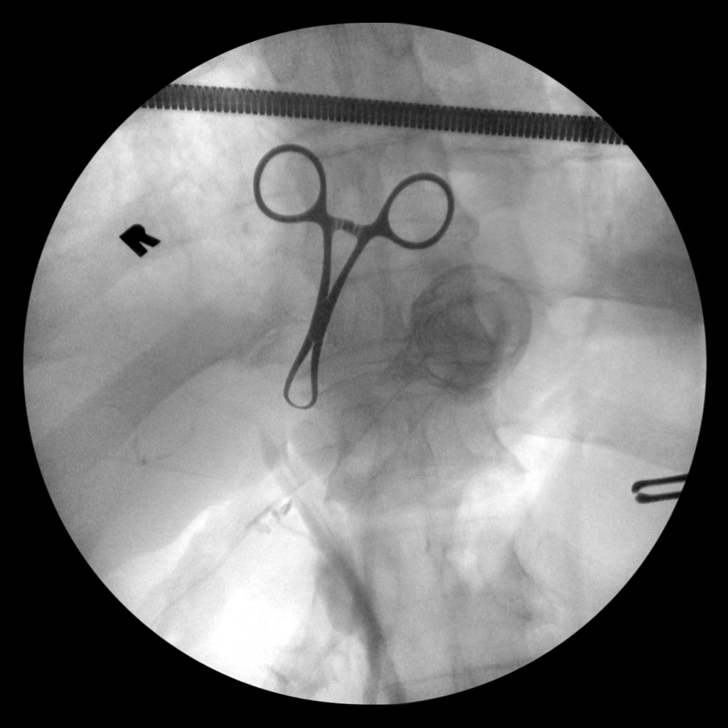
[im 2/4]
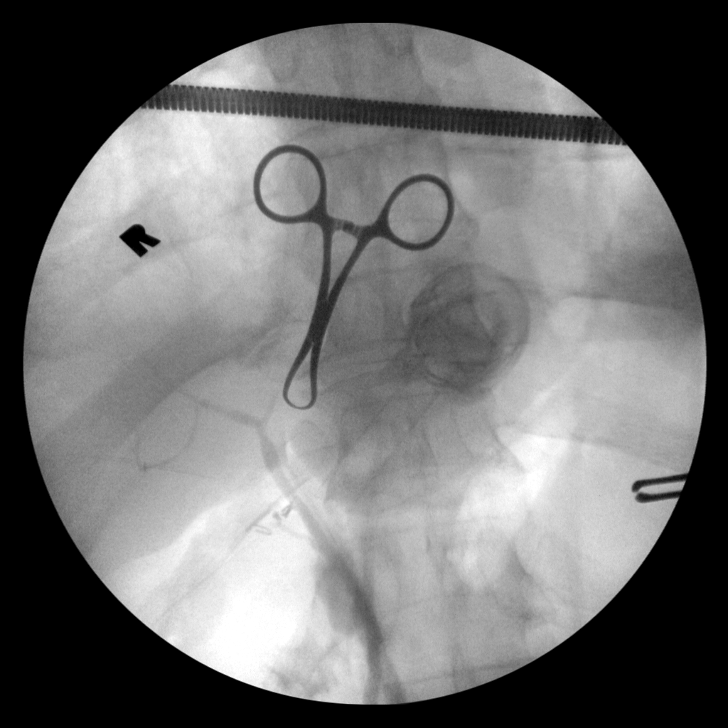
[im 2/4]
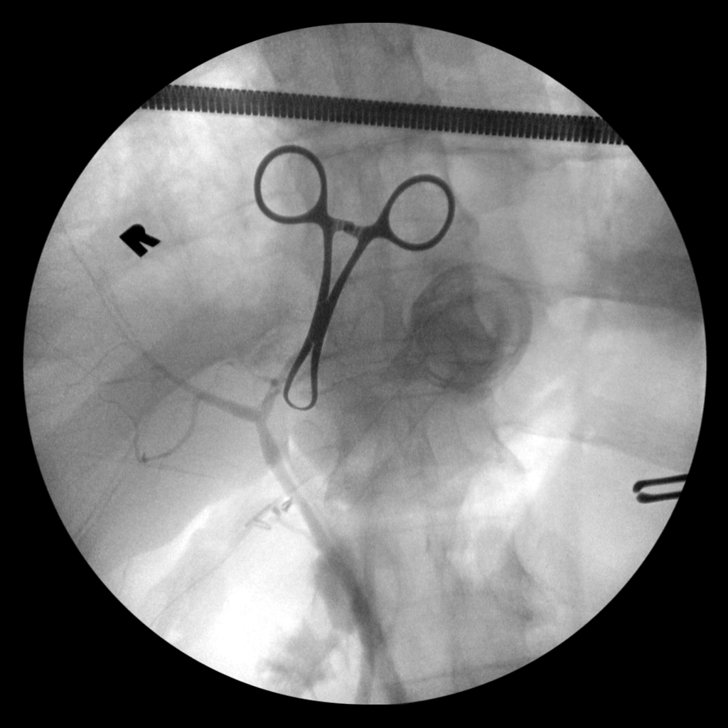
[im 2/4]
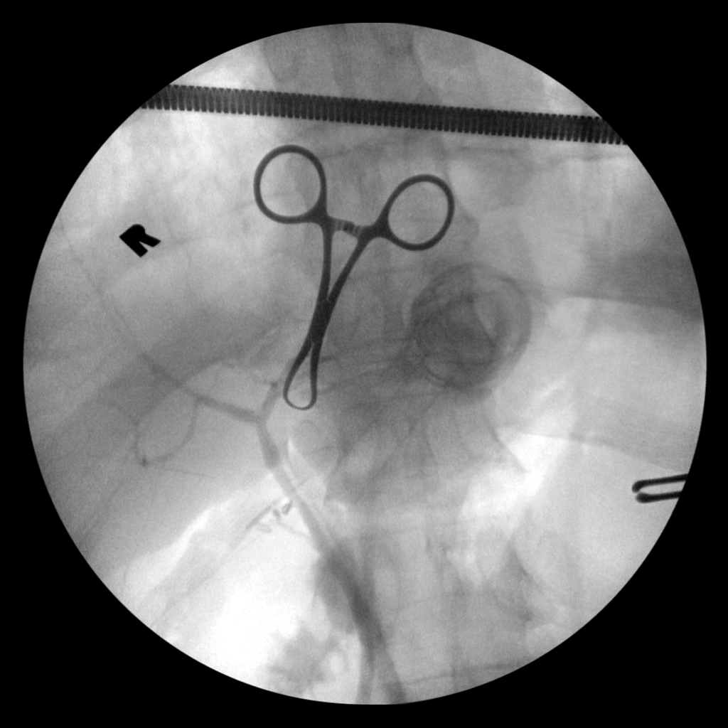
[im 3/4]
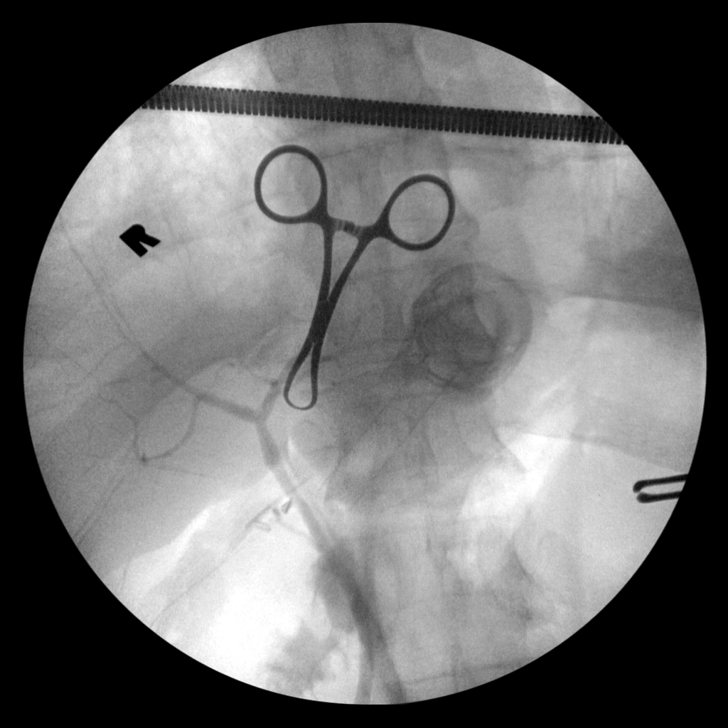
[im 4/4]
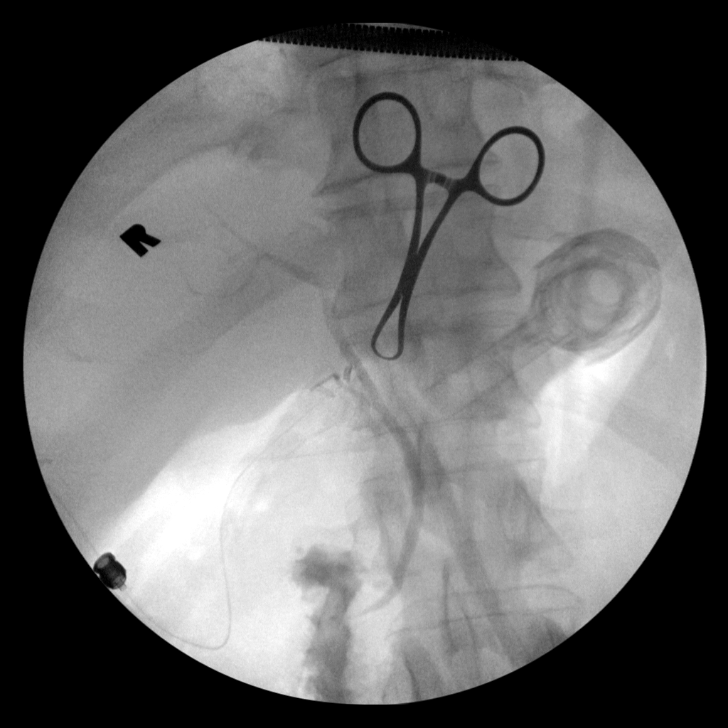

[10 of 10 positions shown; findings below may reference images not displayed]

FINDINGS: No persistent filling defects in the common duct.
Intrahepatic ducts are incompletely visualized, appearing
decompressed centrally. Contrast passes into the duodenum.

IMPRESSION

Negative for retained common duct stone.

## 2011-01-25 SURGERY — LAPAROSCOPIC CHOLECYSTECTOMY WITH INTRAOPERATIVE CHOLANGIOGRAM
Anesthesia: General | Site: Abdomen | Wound class: Contaminated

## 2011-01-25 MED ORDER — MEPERIDINE HCL 25 MG/ML IJ SOLN: 6.2500 mg | INTRAMUSCULAR | Status: DC | PRN

## 2011-01-25 MED ORDER — FUROSEMIDE 80 MG PO TABS
80.0000 mg | ORAL_TABLET | Freq: Two times a day (BID) | ORAL | Status: DC
  Administered 2011-01-25 – 2011-01-26 (×2): 80 mg via ORAL
  Filled 2011-01-25 (×4): qty 1

## 2011-01-25 MED ORDER — INSULIN ASPART 100 UNIT/ML ~~LOC~~ SOLN
0.0000 [IU] | SUBCUTANEOUS | Status: DC
  Administered 2011-01-26: 15 [IU] via SUBCUTANEOUS
  Administered 2011-01-26: 7 [IU] via SUBCUTANEOUS
  Filled 2011-01-25: qty 3

## 2011-01-25 MED ORDER — SODIUM CHLORIDE 0.9 % IR SOLN
Status: DC | PRN
  Administered 2011-01-25: 3000 mL

## 2011-01-25 MED ORDER — VECURONIUM BROMIDE 10 MG IV SOLR
INTRAVENOUS | Status: DC | PRN
  Administered 2011-01-25 (×3): 2 mg via INTRAVENOUS

## 2011-01-25 MED ORDER — BUPIVACAINE HCL (PF) 0.25 % IJ SOLN: INTRAMUSCULAR | Status: DC | PRN

## 2011-01-25 MED ORDER — INSULIN ASPART PROT & ASPART (70-30 MIX) 100 UNIT/ML ~~LOC~~ SUSP: 40.0000 [IU] | Freq: Three times a day (TID) | SUBCUTANEOUS | Status: DC | PRN

## 2011-01-25 MED ORDER — SODIUM CHLORIDE 0.9 % IR SOLN
Status: DC | PRN
  Administered 2011-01-25: 1000 mL

## 2011-01-25 MED ORDER — INSULIN ASPART 100 UNIT/ML ~~LOC~~ SOLN
0.0000 [IU] | Freq: Three times a day (TID) | SUBCUTANEOUS | Status: DC
  Administered 2011-01-25: 15 [IU] via SUBCUTANEOUS
  Filled 2011-01-25: qty 3

## 2011-01-25 MED ORDER — LACTATED RINGERS IV SOLN
INTRAVENOUS | Status: DC
  Administered 2011-01-25 – 2011-01-26 (×2): via INTRAVENOUS

## 2011-01-25 MED ORDER — HEMOSTATIC AGENTS (NO CHARGE) OPTIME
TOPICAL | Status: DC | PRN
  Administered 2011-01-25: 1 via TOPICAL

## 2011-01-25 MED ORDER — MIDAZOLAM HCL 5 MG/5ML IJ SOLN
INTRAMUSCULAR | Status: DC | PRN
  Administered 2011-01-25: 2 mg via INTRAVENOUS

## 2011-01-25 MED ORDER — LOSARTAN POTASSIUM 25 MG PO TABS
25.0000 mg | ORAL_TABLET | Freq: Every day | ORAL | Status: DC
  Administered 2011-01-26: 25 mg via ORAL
  Filled 2011-01-25: qty 1

## 2011-01-25 MED ORDER — PROMETHAZINE HCL 25 MG/ML IJ SOLN: 6.2500 mg | INTRAMUSCULAR | Status: DC | PRN

## 2011-01-25 MED ORDER — DEXAMETHASONE SODIUM PHOSPHATE 10 MG/ML IJ SOLN
INTRAMUSCULAR | Status: DC | PRN
  Administered 2011-01-25: 10 mg via INTRAVENOUS

## 2011-01-25 MED ORDER — ENOXAPARIN SODIUM 40 MG/0.4ML ~~LOC~~ SOLN
40.0000 mg | SUBCUTANEOUS | Status: DC
  Administered 2011-01-26: 40 mg via SUBCUTANEOUS
  Filled 2011-01-25 (×2): qty 0.4

## 2011-01-25 MED ORDER — MULTIVITAMINS PO CAPS: 1.0000 | ORAL_CAPSULE | Freq: Every day | ORAL | Status: DC

## 2011-01-25 MED ORDER — LACTATED RINGERS IV SOLN
INTRAVENOUS | Status: DC | PRN
  Administered 2011-01-25: 07:00:00 via INTRAVENOUS

## 2011-01-25 MED ORDER — INSULIN ASPART 100 UNIT/ML ~~LOC~~ SOLN: 0.0000 [IU] | SUBCUTANEOUS | Status: DC

## 2011-01-25 MED ORDER — MORPHINE SULFATE 2 MG/ML IJ SOLN
2.0000 mg | INTRAMUSCULAR | Status: DC | PRN
  Administered 2011-01-25: 2 mg via INTRAVENOUS
  Filled 2011-01-25: qty 1

## 2011-01-25 MED ORDER — PHENYLEPHRINE HCL 10 MG/ML IJ SOLN
INTRAMUSCULAR | Status: DC | PRN
  Administered 2011-01-25 (×3): 40 ug via INTRAVENOUS

## 2011-01-25 MED ORDER — ISOSORBIDE MONONITRATE ER 30 MG PO TB24
30.0000 mg | ORAL_TABLET | Freq: Every day | ORAL | Status: DC
  Administered 2011-01-26: 30 mg via ORAL
  Filled 2011-01-25 (×2): qty 1

## 2011-01-25 MED ORDER — GLIPIZIDE 10 MG PO TABS
20.0000 mg | ORAL_TABLET | Freq: Two times a day (BID) | ORAL | Status: DC
  Administered 2011-01-25 – 2011-01-26 (×2): 20 mg via ORAL
  Filled 2011-01-25 (×4): qty 2

## 2011-01-25 MED ORDER — HYDROMORPHONE HCL PF 1 MG/ML IJ SOLN
0.2500 mg | INTRAMUSCULAR | Status: DC | PRN
  Administered 2011-01-25: 0.25 mg via INTRAVENOUS

## 2011-01-25 MED ORDER — INSULIN ASPART 100 UNIT/ML ~~LOC~~ SOLN
20.0000 [IU] | Freq: Once | SUBCUTANEOUS | Status: AC
  Administered 2011-01-25: 20 [IU] via SUBCUTANEOUS
  Filled 2011-01-25: qty 3

## 2011-01-25 MED ORDER — ROCURONIUM BROMIDE 100 MG/10ML IV SOLN
INTRAVENOUS | Status: DC | PRN
  Administered 2011-01-25: 50 mg via INTRAVENOUS

## 2011-01-25 MED ORDER — EZETIMIBE 10 MG PO TABS
10.0000 mg | ORAL_TABLET | Freq: Every day | ORAL | Status: DC
  Administered 2011-01-25 – 2011-01-26 (×2): 10 mg via ORAL
  Filled 2011-01-25 (×2): qty 1

## 2011-01-25 MED ORDER — LOSARTAN POTASSIUM 25 MG PO TABS: 25.0000 mg | ORAL_TABLET | Freq: Every day | ORAL | Status: DC

## 2011-01-25 MED ORDER — THERA M PLUS PO TABS
1.0000 | ORAL_TABLET | Freq: Every day | ORAL | Status: DC
  Administered 2011-01-26: 1 via ORAL
  Filled 2011-01-25 (×2): qty 1

## 2011-01-25 MED ORDER — LIDOCAINE HCL 1 % IJ SOLN
INTRAMUSCULAR | Status: DC | PRN
  Administered 2011-01-25: 10:00:00 via INTRAMUSCULAR

## 2011-01-25 MED ORDER — LIDOCAINE-EPINEPHRINE 1 %-1:100000 IJ SOLN: INTRAMUSCULAR | Status: DC | PRN

## 2011-01-25 MED ORDER — INSULIN ASPART 100 UNIT/ML ~~LOC~~ SOLN
0.0000 [IU] | SUBCUTANEOUS | Status: DC
  Filled 2011-01-25: qty 3

## 2011-01-25 MED ORDER — IOHEXOL 300 MG/ML  SOLN
INTRAMUSCULAR | Status: DC | PRN
  Administered 2011-01-25: 15 mL

## 2011-01-25 MED ORDER — PREDNISONE 10 MG PO TABS
10.0000 mg | ORAL_TABLET | Freq: Every day | ORAL | Status: DC
  Filled 2011-01-25 (×2): qty 1

## 2011-01-25 MED ORDER — HYDROCODONE-ACETAMINOPHEN 5-325 MG PO TABS
1.0000 | ORAL_TABLET | ORAL | Status: DC | PRN
  Administered 2011-01-25 – 2011-01-26 (×3): 1 via ORAL
  Filled 2011-01-25 (×3): qty 1

## 2011-01-25 MED ORDER — PROPOFOL 10 MG/ML IV EMUL
INTRAVENOUS | Status: DC | PRN
  Administered 2011-01-25: 160 mg via INTRAVENOUS

## 2011-01-25 MED ORDER — INSULIN ASPART PROT & ASPART (70-30 MIX) 100 UNIT/ML ~~LOC~~ SUSP
45.0000 [IU] | Freq: Three times a day (TID) | SUBCUTANEOUS | Status: DC
  Filled 2011-01-25: qty 3

## 2011-01-25 MED ORDER — ENOXAPARIN SODIUM 40 MG/0.4ML ~~LOC~~ SOLN
40.0000 mg | SUBCUTANEOUS | Status: DC
  Filled 2011-01-25: qty 0.4

## 2011-01-25 MED ORDER — FENTANYL CITRATE 0.05 MG/ML IJ SOLN
INTRAMUSCULAR | Status: DC | PRN
  Administered 2011-01-25 (×2): 50 ug via INTRAVENOUS
  Administered 2011-01-25: 150 ug via INTRAVENOUS
  Administered 2011-01-25: 50 ug via INTRAVENOUS

## 2011-01-25 MED ORDER — ONDANSETRON HCL 4 MG/2ML IJ SOLN
INTRAMUSCULAR | Status: DC | PRN
  Administered 2011-01-25: 4 mg via INTRAVENOUS

## 2011-01-25 MED ORDER — GLYCOPYRROLATE 0.2 MG/ML IJ SOLN
INTRAMUSCULAR | Status: DC | PRN
  Administered 2011-01-25: .8 mg via INTRAVENOUS

## 2011-01-25 MED ORDER — LIDOCAINE HCL (CARDIAC) 20 MG/ML IV SOLN
INTRAVENOUS | Status: DC | PRN
  Administered 2011-01-25: 100 mg via INTRAVENOUS

## 2011-01-25 MED ORDER — POTASSIUM CHLORIDE CRYS ER 20 MEQ PO TBCR
20.0000 meq | EXTENDED_RELEASE_TABLET | Freq: Every day | ORAL | Status: DC
  Administered 2011-01-25 – 2011-01-26 (×2): 20 meq via ORAL
  Filled 2011-01-25 (×2): qty 1

## 2011-01-25 MED ORDER — NEOSTIGMINE METHYLSULFATE 1 MG/ML IJ SOLN
INTRAMUSCULAR | Status: DC | PRN
  Administered 2011-01-25: 5 mg via INTRAVENOUS

## 2011-01-25 SURGICAL SUPPLY — 51 items
APPLIER CLIP ROT 10 11.4 M/L (STAPLE) ×2
BLADE SURG ROTATE 9660 (MISCELLANEOUS) ×2 IMPLANT
CANISTER SUCTION 2500CC (MISCELLANEOUS) ×2 IMPLANT
CATH REDDICK CHOLANGI 4FR 50CM (CATHETERS) ×2 IMPLANT
CHLORAPREP W/TINT 26ML (MISCELLANEOUS) ×2 IMPLANT
CLIP APPLIE ROT 10 11.4 M/L (STAPLE) ×1 IMPLANT
CLOTH BEACON ORANGE TIMEOUT ST (SAFETY) ×2 IMPLANT
COVER SURGICAL LIGHT HANDLE (MISCELLANEOUS) ×2 IMPLANT
DECANTER SPIKE VIAL GLASS SM (MISCELLANEOUS) IMPLANT
DERMABOND ADVANCED (GAUZE/BANDAGES/DRESSINGS)
DERMABOND ADVANCED .7 DNX12 (GAUZE/BANDAGES/DRESSINGS) IMPLANT
DISSECTOR BLUNT TIP ENDO 5MM (MISCELLANEOUS) ×2 IMPLANT
DRAPE C-ARM 42X72 X-RAY (DRAPES) ×2 IMPLANT
ELECT CAUTERY BLADE 6.4 (BLADE) ×2 IMPLANT
ELECT REM PT RETURN 9FT ADLT (ELECTROSURGICAL) ×2
ELECTRODE REM PT RTRN 9FT ADLT (ELECTROSURGICAL) ×1 IMPLANT
GAUZE SPONGE 4X4 12PLY STRL LF (GAUZE/BANDAGES/DRESSINGS) ×2 IMPLANT
GLOVE BIO SURGEON STRL SZ7.5 (GLOVE) ×6 IMPLANT
GLOVE BIO SURGEON STRL SZ8 (GLOVE) ×4 IMPLANT
GLOVE BIOGEL PI IND STRL 7.0 (GLOVE) ×1 IMPLANT
GLOVE BIOGEL PI IND STRL 7.5 (GLOVE) ×3 IMPLANT
GLOVE BIOGEL PI INDICATOR 7.0 (GLOVE) ×1
GLOVE BIOGEL PI INDICATOR 7.5 (GLOVE) ×3
GLOVE ECLIPSE 6.5 STRL STRAW (GLOVE) ×4 IMPLANT
GLOVE SURG SS PI 7.5 STRL IVOR (GLOVE) ×6 IMPLANT
GOWN PREVENTION PLUS XLARGE (GOWN DISPOSABLE) ×2 IMPLANT
GOWN STRL NON-REIN LRG LVL3 (GOWN DISPOSABLE) ×16 IMPLANT
HEMOSTAT SURGICEL 2X14 (HEMOSTASIS) ×2 IMPLANT
IV CATH 14GX2 1/4 (CATHETERS) IMPLANT
KIT BASIN OR (CUSTOM PROCEDURE TRAY) ×2 IMPLANT
KIT ROOM TURNOVER OR (KITS) ×2 IMPLANT
NS IRRIG 1000ML POUR BTL (IV SOLUTION) ×2 IMPLANT
PAD ARMBOARD 7.5X6 YLW CONV (MISCELLANEOUS) ×2 IMPLANT
PENCIL BUTTON HOLSTER BLD 10FT (ELECTRODE) ×2 IMPLANT
POUCH SPECIMEN RETRIEVAL 10MM (ENDOMECHANICALS) ×2 IMPLANT
SCISSORS LAP 5X35 DISP (ENDOMECHANICALS) IMPLANT
SET IRRIG TUBING LAPAROSCOPIC (IRRIGATION / IRRIGATOR) ×2 IMPLANT
SLEEVE ENDOPATH XCEL 5M (ENDOMECHANICALS) ×4 IMPLANT
SPECIMEN JAR SMALL (MISCELLANEOUS) ×2 IMPLANT
SUT ETHILON 2 0 FS 18 (SUTURE) ×2 IMPLANT
SUT MNCRL AB 4-0 PS2 18 (SUTURE) ×4 IMPLANT
SUT VICRYL 0 UR6 27IN ABS (SUTURE) ×4 IMPLANT
TAPE CLOTH SURG 4X10 WHT LF (GAUZE/BANDAGES/DRESSINGS) ×2 IMPLANT
TOWEL OR 17X24 6PK STRL BLUE (TOWEL DISPOSABLE) ×2 IMPLANT
TOWEL OR 17X26 10 PK STRL BLUE (TOWEL DISPOSABLE) ×2 IMPLANT
TRAY FOLEY CATH 14FR (SET/KITS/TRAYS/PACK) IMPLANT
TRAY LAPAROSCOPIC (CUSTOM PROCEDURE TRAY) ×2 IMPLANT
TROCAR HASSON GELL 12X100 (TROCAR) ×2 IMPLANT
TROCAR XCEL NON-BLD 11X100MML (ENDOMECHANICALS) ×2 IMPLANT
TROCAR XCEL NON-BLD 5MMX100MML (ENDOMECHANICALS) ×2 IMPLANT
WATER STERILE IRR 1000ML POUR (IV SOLUTION) IMPLANT

## 2011-01-25 NOTE — H&P (Signed)
Date of Initial H&P: 12/30/10  History reviewed, patient examined, no change in status, stable for surgery.  He is still having RUQ abdominal pain and has seen his cardiologist and primary physician and has been cleared for surgery.  Procedure and its risks again discussed and he desires to proceed with lap chole/IOC.  The risks of infection, bleeding, pain, persistent symptoms, scarring, injury to bowel or bile ducts, retained stone, diarrhea, need for additional procedures, and need for open surgery discussed with the patient.   Lodema Pilot DAVID 01/25/2011 7:24 AM

## 2011-01-25 NOTE — Transfer of Care (Signed)
Immediate Anesthesia Transfer of Care Note  Patient: [name]  Procedure(s) Performed:  LAPAROSCOPIC CHOLECYSTECTOMY WITH INTRAOPERATIVE CHOLANGIOGRAM  Patient Location: PACU  Anesthesia Type: General  Level of Consciousness: awake and alert   Airway & Oxygen Therapy: Patient Spontanous Breathing and Patient connected to nasal cannula oxygen  Post-op Assessment: Report given to PACU RN, Post -op Vital signs reviewed and stable and Patient moving all extremities X 4  Post vital signs: stable  Complications: No apparent anesthesia complications

## 2011-01-25 NOTE — Anesthesia Postprocedure Evaluation (Signed)
  Anesthesia Post-op Note  Patient: [name]  Procedure(s) Performed:  LAPAROSCOPIC CHOLECYSTECTOMY WITH INTRAOPERATIVE CHOLANGIOGRAM  Patient Location: PACU  Anesthesia Type: General  Level of Consciousness: sedated  Airway and Oxygen Therapy: Patient Spontanous Breathing and Patient connected to nasal cannula oxygen  Post-op Pain: none  Post-op Assessment: Post-op Vital signs reviewed, Patient's Cardiovascular Status Stable, Respiratory Function Stable and Patent Airway  Post-op Vital Signs: stable  Complications: No apparent anesthesia complications

## 2011-01-25 NOTE — Brief Op Note (Addendum)
01/25/2011  10:34 AM  PATIENT:  [name]  65 y.o. male  PRE-OPERATIVE DIAGNOSIS:  cholelithiasis  POST-OPERATIVE DIAGNOSIS:  cholelithiasis  PROCEDURE:  Procedure(s): LAPAROSCOPIC CHOLECYSTECTOMY WITH INTRAOPERATIVE CHOLANGIOGRAM  SURGEON:  Surgeon(s): Rulon Abide, DO Iona Coach, MD  PHYSICIAN ASSISTANT:   ASSISTANTSZachery Dakins   ANESTHESIA:   general  EBL:  Total I/O In: -  Out: 175 [Urine:125; Blood:50]  BLOOD ADMINISTERED:none  DRAINS: (80f) Jackson-Pratt drain(s) with closed bulb suction in the gallbladder fossa   LOCAL MEDICATIONS USED:  MARCAINE 15CC and LIDOCAINE 15 CC  SPECIMEN:  Source of Specimen:  gallbladder  DISPOSITION OF SPECIMEN:  PATHOLOGY  COUNTS:  YES  TOURNIQUET:  * No tourniquets in log *  DICTATION: .Other Dictation: Dictation Number   PLAN OF CARE: Admit to inpatient   PATIENT DISPOSITION:  PACU - hemodynamically stable.   Delay start of Pharmacological VTE agent (>24hrs) due to surgical blood loss or risk of bleeding:  {YES/NO/NOT APPLICABLE:20182  Dictated job [id number]

## 2011-01-25 NOTE — Op Note (Signed)
[name], [name] NO.:  [id number]  MEDICAL RECORD NO.:  [id number]  LOCATION:  [facility]                         FACILITY:  [facility]  PHYSICIAN:  Lodema Pilot, MD       DATE OF BIRTH:  May 14, 1945  DATE OF PROCEDURE:  01/25/2011 DATE OF DISCHARGE:                              OPERATIVE REPORT   PROCEDURE:  Laparoscopic cholecystectomy with intraoperative cholangiogram and lysis of adhesions.  PREOPERATIVE DIAGNOSIS:  Symptomatic cholelithiasis.  POSTOPERATIVE DIAGNOSIS:  Symptomatic cholelithiasis.  SURGEON:  Lodema Pilot, MD  ASSISTANT:  Anselm Pancoast. Zachery Dakins, MD  ANESTHESIA:  General endotracheal tube anesthesia with 30 mL of 1% lidocaine with epinephrine and 0.25% Marcaine in a 50:50 mixture.  FLUIDS:  1100 mL of crystalloid.  ESTIMATED BLOOD LOSS:  100 mL.  DRAINS:  19-French Harrison Mons drain placed in the gallbladder fossa and exited through the right lateral abdominal trocar site.  SPECIMEN:  Gallbladder and contents sent to pathology for permanent sectioning.  COMPLICATIONS:  None apparent.  FINDINGS:  Significant amount of intra-abdominal adhesions throughout the whole right side of the abdomen and evidence of prior cholecystitis with multiple omental adhesions to the gallbladder.  Normal cholangiogram.  INDICATION FOR PROCEDURE:  [name] is a 65 year old male with many-year history of right upper quadrant abdominal pain, some food association and has had a significant cardiac workup and finally had an ultrasound which consistent with symptomatic cholelithiasis.  Given his history and his ultrasound findings, decided to proceed with cholecystectomy.  OPERATIVE DETAILS:  [name] was seen and evaluated in the preoperative area and risks and benefits of procedure were again discussed in lay terms.  Informed consent was obtained.  He had already received some cardiac clearance from his cardiologist and prophylactic antibiotics and stress dose  steroids were administered.  He was taken to the operating room, placed on table in a supine position.  General endotracheal anesthesia was obtained and Foley catheter was placed.  His abdomen was then prepped and draped in the standard surgical fashion and a supraumbilical midline incision was made in the skin and dissection carried down to the subcutaneous tissue using blunt dissection and  the abdominal wall fascia was elevated and sharply incised.  A 0 Vicryl suture was placed for a Hasson port and the peritoneum was entered under direct visualization.  Insert my finger under the abdomen, there was some omental adhesions, I placed trocar and pneumoperitoneum was obtained and laparoscope was introduced.  He has had multiple intra- abdominal adhesions of the omentum to the abdominal wall.  There was a clear plane to the left side of the abdomen and so I placed a 5 mm trocar in the left mid abdomen.  This allowed me to takedown the adhesions with sharp dissection and blunt dissection and allowed [place] to place the right upper quadrant abdominal trocar site.  Two 5 mm right upper quadrant trocars were placed under direct visualization, an 11 mm trocar was placed under direct visualization.  The omentum was densely adhered to the gallbladder and the gallbladder fossa.  The gallbladder was difficult to even visualize until approximately 45 minutes of dissection, taking down the  omentum from the gallbladder fossa and the gallbladder itself.  Minimal amount of cautery was used during this part of the dissection and mostly sharp dissection was performed.  Then, we are able to elevate the gallbladder and then using blunt dissection, fatty attachments were taken down from the gallbladder, we are finally able to visualize the gallbladder wall.  Then, I continue to takedown the fat from the gallbladder and the lymph node of [place] was identified. The artery was identified on the gallbladder and this  was dissected and clipped and then this allowed [place] to visualize the cystic duct.  The cystic duct was skeletonized and a clip was placed on the gallbladder side.  A small cystic ductotomy was made and the cholangiogram catheter was placed through the abdominal wall through angiocatheter.  The catheter was placed in the cystic duct and clipped into place and a cholangiogram was performed which demonstrated free flow of bile into the duodenum and no filling defects in the common bile duct, and normal right and left hepatic ducts, and then the catheter was removed and the cystic duct was clipped with 3 hemoclips on the stay side, and the duct was divided.  Then, the gallbladder was removed from the gallbladder fossa using Bovie with electrocautery.  The gallbladder was entered during the dissection, and there was spillage of thick sludge type bile, act almost semi-formed bile, very thick sludge.  This was suctioned. The wall of the gallbladder was elevated and then Bovie with electrocautery was used to completely remove the gallbladder from the gallbladder fossa.  The gallbladder was placed in an EndoCatch bag and removed from the umbilicus in an EndoCatch bag and passed off the table and sent to pathology for permanent section.  The right upper quadrant was then irrigated with many liters of sterile saline solution until the irrigation returned clear and the sludge had been suctioned out.  The gallbladder fossa was inspected for hemostasis which was noted be adequate but given the difficult dissection and the multiple omental adhesions and the fact that would be restarting his Plavix ,as I decided to place some Surgicel gauze in the gallbladder fossa and placed a 19- [place] Blake drain in the gallbladder fossa as well, and exited through the 5 mm right upper quadrant trocar site and sutured in place with a 2- 0 nylon drain stitch.  The remainder of the fluid was suctioned and the right  upper quadrant trocars were removed.  The left lateral abdominal trocar was removed under direct visualization.  Abdominal wall was noted to be hemostatic.  Then, the umbilical fascia was approximated with interrupted 0 Vicryl sutures.  The sutures were secured and the abdomen was reinsufflated through the epigastric trocar site, then camera introduced in the right upper quadrant was noted to be hemostatic and the abdominal wall closure was noted be adequate with no evidence of bowel injury.  There was no evidence of bowel injury throughout the abdomen and again the abdomen was noted to be hemostatic.  The epigastric trocar was removed, and the skin was injected with a total of 30 mL of 1% lidocaine with epinephrine, and 0.25% Marcaine in a 50:50 mixture, and the skin edges were approximated with 4-0 Monocryl subcuticular suture.  Skin was washed and dried and Dermabond was applied.  All sponge, needle, and instrument counts were correct at the end of the case.  The patient tolerated procedure well without apparent complications.          ______________________________ Arlys John  Biagio Quint, MD    BL/MEDQ  D:  01/25/2011  T:  01/25/2011  Job:  [id number]

## 2011-01-25 NOTE — Anesthesia Procedure Notes (Signed)
Performed by: Crissie Sickles

## 2011-01-26 ENCOUNTER — Encounter (HOSPITAL_COMMUNITY): Payer: Self-pay | Admitting: General Surgery

## 2011-01-26 LAB — CBC
HCT: 38.8 % — ABNORMAL LOW (ref 39.0–52.0)
MCV: 95.8 fL (ref 78.0–100.0)
RBC: 4.05 MIL/uL — ABNORMAL LOW (ref 4.22–5.81)
WBC: 16.5 10*3/uL — ABNORMAL HIGH (ref 4.0–10.5)

## 2011-01-26 LAB — GLUCOSE, CAPILLARY
Glucose-Capillary: 215 mg/dL — ABNORMAL HIGH (ref 70–99)
Glucose-Capillary: 219 mg/dL — ABNORMAL HIGH (ref 70–99)
Glucose-Capillary: 252 mg/dL — ABNORMAL HIGH (ref 70–99)

## 2011-01-26 MED ORDER — CHLORHEXIDINE GLUCONATE CLOTH 2 % EX PADS: 6.0000 | MEDICATED_PAD | Freq: Every day | CUTANEOUS | Status: DC

## 2011-01-26 MED ORDER — HYDROCODONE-ACETAMINOPHEN 5-325 MG PO TABS: 1.0000 | ORAL_TABLET | ORAL | Status: DC | PRN

## 2011-01-26 MED ORDER — HYDROCODONE-ACETAMINOPHEN 5-325 MG PO TABS: 1.0000 | ORAL_TABLET | ORAL | Status: AC | PRN

## 2011-01-26 MED ORDER — INSULIN ASPART PROT & ASPART (70-30 MIX) 100 UNIT/ML ~~LOC~~ SUSP
45.0000 [IU] | Freq: Three times a day (TID) | SUBCUTANEOUS | Status: DC
  Administered 2011-01-26 (×2): 45 [IU] via SUBCUTANEOUS

## 2011-01-26 MED ORDER — MUPIROCIN 2 % EX OINT
1.0000 "application " | TOPICAL_OINTMENT | Freq: Two times a day (BID) | CUTANEOUS | Status: DC
  Administered 2011-01-26: 1 via NASAL
  Filled 2011-01-26: qty 22

## 2011-01-26 MED ORDER — INSULIN ASPART 100 UNIT/ML ~~LOC~~ SOLN
0.0000 [IU] | Freq: Three times a day (TID) | SUBCUTANEOUS | Status: DC
  Administered 2011-01-26: 11 [IU] via SUBCUTANEOUS
  Administered 2011-01-26: 7 [IU] via SUBCUTANEOUS
  Filled 2011-01-26: qty 3

## 2011-01-26 MED ORDER — INSULIN ASPART 100 UNIT/ML ~~LOC~~ SOLN
0.0000 [IU] | Freq: Every day | SUBCUTANEOUS | Status: DC
  Filled 2011-01-26: qty 3

## 2011-01-26 NOTE — Consult Note (Signed)
Referring Physician: Trude Mcburney    Chief Complaint: right hand weakness HPI: [name] is an 65 y.o. right handed male who presented to the hospital with Symptomatic cholelithiasis and RUQ pain. On 01-25-11 he underwent a Laparoscopic cholecystectomy with intraoperative cholangiogram and lysis of adhesions. He states after waking up form anesthesia (around 11 am) both of his hands felt numb.  Over time his left hand returned to normal but his right thumb, pointer finger and middle finger remained tingling and decreased sensation.  He does state his symptoms have improved slightly.  His strength feels good but due to lack of sensation he feels his grip is decreased.   LSN: morning of 01-25-11 tPA Given: No: postoperative Past Medical History  Diagnosis Date  . Type 2 diabetes mellitus   . Sarcoid   . Adrenal insufficiency   . Hypertension   . OSA (obstructive sleep apnea)     uses VPAC sleep study 2 years done through [place]. Dr. Anne Fu arranged study  . Coronary artery disease     has stents  . Heart attack 2010  . Pneumonia     3-4 years ago  . Diabetes mellitus     insulin and pills    Past Surgical History  Procedure Date  . Coronary stent placement 2009    in LAD and side branch PTCA  . Cataract extraction 2011    bilat  . Intercostal nerve block 2011    x2. lumbar spine  . Appendectomy   . Coronary angioplasty     most recent 11/2009    Family History  Problem Relation Age of Onset  . Asthma Sister   . Anesthesia problems Sister     "Kidney's did not wake up"  . Kidney disease Mother   . Diabetes Mother   . Kidney cancer Mother   . Cancer Mother     kidney  . Heart attack Father    Social History:  reports that he has never smoked. He has never used smokeless tobacco. He reports that he does not drink alcohol or use illicit drugs.  Allergies:  Allergies  Allergen Reactions  . Hydrocortisone Nausea Only  . Statins Other (See Comments)    Pt says they make him  "mean"    Medications:   Prior to Admission medications   Medication Sig Start Date End Date Taking? Authorizing Provider  aspirin 81 MG tablet Take 81 mg by mouth daily.     Yes Historical Provider, MD  clopidogrel (PLAVIX) 75 MG tablet Take 75 mg by mouth daily.     Yes Historical Provider, MD  COENZYME Q-10 PO Take 1 tablet by mouth daily.     Yes Historical Provider, MD  ezetimibe (ZETIA) 10 MG tablet Take 10 mg by mouth daily.     Yes Historical Provider, MD  fish oil-omega-3 fatty acids 1000 MG capsule Take 1 g by mouth daily.    Yes Historical Provider, MD  furosemide (LASIX) 80 MG tablet Take 80 mg by mouth 2 (two) times daily.     Yes Historical Provider, MD  glipiZIDE (GLUCOTROL) 10 MG tablet Take 20 mg by mouth 2 (two) times daily.     Yes Historical Provider, MD  insulin aspart protamine-insulin aspart (NOVOLOG 70/30) (70-30) 100 UNIT/ML injection Inject 45-55 Units into the skin 3 (three) times daily with meals. Takes 3 times a day with a meal.  If blood sugar <150, then uses 45 units.  If blood sugar greater than or  equal 200 uses 55 units    Yes Historical Provider, MD  isosorbide mononitrate (IMDUR) 30 MG 24 hr tablet Take 30 mg by mouth daily.     Yes Historical Provider, MD  KLOR-CON M10 10 MEQ tablet TAKE 2 TABLETS BY MOUTH TWICE A DAY 12/31/10  Yes Shan Levans, MD  losartan (COZAAR) 100 MG tablet TAKE 1 TABLET BY MOUTH EVERY DAY 01/20/11  Yes Shan Levans, MD  Multiple Vitamin (MULTIVITAMIN) capsule Take 1 capsule by mouth daily.     Yes Historical Provider, MD  predniSONE (DELTASONE) 10 MG tablet Take 10 mg by mouth daily. As directed    Yes Historical Provider, MD  HYDROcodone-acetaminophen (NORCO) 5-325 MG per tablet Take 1 tablet by mouth every 4 (four) hours as needed (pain). 01/26/11 02/05/11  Rulon Abide, DO    Scheduled:   . Chlorhexidine Gluconate Cloth  6 each Topical Q0600  . enoxaparin  40 mg Subcutaneous Q24H  . ezetimibe  10 mg Oral Daily  .  furosemide  80 mg Oral BID  . glipiZIDE  20 mg Oral BID WC  . insulin aspart  0-20 Units Subcutaneous TID WC  . insulin aspart  0-5 Units Subcutaneous QHS  . insulin aspart  20 Units Subcutaneous Once  . insulin aspart protamine-insulin aspart  45 Units Subcutaneous TID AC  . isosorbide mononitrate  30 mg Oral Daily  . losartan  25 mg Oral Daily  . multivitamins ther. w/minerals  1 tablet Oral Daily  . mupirocin ointment  1 application Nasal BID  . potassium chloride  20 mEq Oral Daily  . predniSONE  10 mg Oral Q breakfast  . DISCONTD: enoxaparin  40 mg Subcutaneous Q24H  . DISCONTD: insulin aspart  0-15 Units Subcutaneous TID WC  . DISCONTD: insulin aspart  0-15 Units Subcutaneous Q4H  . DISCONTD: insulin aspart  0-20 Units Subcutaneous Q4H  . DISCONTD: insulin aspart  0-24 Units Subcutaneous Q2H  . DISCONTD: insulin aspart  0-24 Units Subcutaneous Q4H  . DISCONTD: insulin aspart protamine-insulin aspart  45-55 Units Subcutaneous TID WC  . DISCONTD: losartan  25 mg Oral Daily  . DISCONTD: multivitamin  1 capsule Oral Daily    ROS: History obtained from the patient  General ROS: negative for - chills, fatigue, fever, night sweats, weight gain or weight loss Psychological ROS: negative for - behavioral disorder, hallucinations, memory difficulties, mood swings or suicidal ideation Ophthalmic ROS: negative for - blurry vision, double vision, eye pain or loss of vision ENT ROS: negative for - epistaxis, nasal discharge, oral lesions, sore throat, tinnitus or vertigo Allergy and Immunology ROS: negative for - hives or itchy/watery eyes Hematological and Lymphatic ROS: negative for - bleeding problems, bruising or swollen lymph nodes Endocrine ROS: negative for - galactorrhea, hair pattern changes, polydipsia/polyuria or temperature intolerance Respiratory ROS: negative for - cough, hemoptysis, shortness of breath or wheezing Cardiovascular ROS: negative for - chest pain, dyspnea on  exertion, edema or irregular heartbeat Gastrointestinal ROS: negative for - abdominal pain, diarrhea, hematemesis, nausea/vomiting or stool incontinence Genito-Urinary ROS: negative for - dysuria, hematuria, incontinence or urinary frequency/urgency Musculoskeletal ROS: negative for - joint swelling or muscular weakness Neurological ROS: as noted in HPI Dermatological ROS: negative for rash and skin lesion changes   Physical Examination: Blood pressure 122/56, pulse 67, temperature 97.9 F (36.6 C), temperature source Oral, resp. rate 18, height 5\' 10"  (1.778 m), weight 112.1 kg (247 lb 2.2 oz), SpO2 96.00%.  Neurologic Examination: Mental Status: Alert, oriented, thought  content appropriate.  Speech fluent without evidence of aphasia. Able to follow 3 step commands without difficulty. Cranial Nerves: II- Visual fields grossly intact. III/IV/VI-Extraocular movements intact.  Pupils reactive bilaterally. V/VII-Smile symmetric VIII-grossly intact IX/X-normal gag XI-bilateral shoulder shrug XII-midline tongue extension Motor: 5/5 bilaterally with normal tone and bulk --I do note a slight weakness in his right wrist flex/ext compared to his left but it is very slight.  Finger flexion, thumb opposition and finger extensions appear equal. Sensory: normal EXCEPT he has decreased sensation on the left hand along his mid-to lateral hand encompassing his middle, pointer finger and thumb.  He also notes he has more sensation on the medial aspect of his middle finger than lateral aspect. Negative phalens and tinnels.  Deep Tendon Reflexes: 2+ UE / 1+ patella and negative at achilles. Symmetric throughout Plantars: mute bilaterally Cerebellar: Normal finger-to-nose, normal rapid alternating movements and normal heel-to-shin test.     Results for orders placed during the hospital encounter of 01/25/11 (from the past 48 hour(s))  GLUCOSE, CAPILLARY     Status: Abnormal   Collection Time   01/25/11   6:21 AM      Component Value Range Comment   Glucose-Capillary 126 (*) 70 - 99 (mg/dL)   GLUCOSE, CAPILLARY     Status: Abnormal   Collection Time   01/25/11 10:51 AM      Component Value Range Comment   Glucose-Capillary 225 (*) 70 - 99 (mg/dL)   GLUCOSE, CAPILLARY     Status: Abnormal   Collection Time   01/25/11 12:56 PM      Component Value Range Comment   Glucose-Capillary 255 (*) 70 - 99 (mg/dL)   CBC     Status: Abnormal   Collection Time   01/25/11  4:02 PM      Component Value Range Comment   WBC 16.0 (*) 4.0 - 10.5 (K/uL)    RBC 4.10 (*) 4.22 - 5.81 (MIL/uL)    Hemoglobin 13.6  13.0 - 17.0 (g/dL)    HCT 04.5  40.9 - 81.1 (%)    MCV 95.6  78.0 - 100.0 (fL)    MCH 33.2  26.0 - 34.0 (pg)    MCHC 34.7  30.0 - 36.0 (g/dL)    RDW 91.4  78.2 - 95.6 (%)    Platelets 264  150 - 400 (K/uL)   GLUCOSE, CAPILLARY     Status: Abnormal   Collection Time   01/25/11  8:35 PM      Component Value Range Comment   Glucose-Capillary 413 (*) 70 - 99 (mg/dL)    Comment 1 Notify RN     GLUCOSE, RANDOM     Status: Abnormal   Collection Time   01/25/11  9:38 PM      Component Value Range Comment   Glucose, Bld 388 (*) 70 - 99 (mg/dL)   GLUCOSE, CAPILLARY     Status: Abnormal   Collection Time   01/25/11 11:59 PM      Component Value Range Comment   Glucose-Capillary 302 (*) 70 - 99 (mg/dL)   GLUCOSE, CAPILLARY     Status: Abnormal   Collection Time   01/26/11  3:51 AM      Component Value Range Comment   Glucose-Capillary 215 (*) 70 - 99 (mg/dL)   CBC     Status: Abnormal   Collection Time   01/26/11  5:45 AM      Component Value Range Comment   WBC 16.5 (*) 4.0 -  10.5 (K/uL)    RBC 4.05 (*) 4.22 - 5.81 (MIL/uL)    Hemoglobin 13.4  13.0 - 17.0 (g/dL)    HCT 16.1 (*) 09.6 - 52.0 (%)    MCV 95.8  78.0 - 100.0 (fL)    MCH 33.1  26.0 - 34.0 (pg)    MCHC 34.5  30.0 - 36.0 (g/dL)    RDW 04.5  40.9 - 81.1 (%)    Platelets 267  150 - 400 (K/uL)   GLUCOSE, CAPILLARY     Status:  Abnormal   Collection Time   01/26/11  7:54 AM      Component Value Range Comment   Glucose-Capillary 219 (*) 70 - 99 (mg/dL)    Dg Cholangiogram Operative  01/25/2011  *RADIOLOGY REPORT*  Clinical Data:   cholelithiasis  INTRAOPERATIVE CHOLANGIOGRAM  Technique:  Cholangiographic images from the C-arm fluoroscopic device were submitted for interpretation post-operatively.  Please see the procedural report for the amount of contrast and the fluoroscopy time utilized.  Comparison:  none  Findings:  No persistent filling defects in the common duct. Intrahepatic ducts are incompletely visualized, appearing decompressed centrally. Contrast passes into the duodenum.  IMPRESSION  Negative for retained common duct stone.  Original Report Authenticated By: Osa Craver, M.D.    Assessment: 65 y.o. male who 01-25-11 underwent a laparoscopic cholecystectomy. After waking form anesthesia bilateral hands showed paresthesia however the left hand returned to normal.  Patient continues to have decreased sensation in th median nerve distribution of his right hand.   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  Would recommend patient F/U with out patient neurology in 1-2 months if symptoms have not resolved.  Do not recommend any further diagnostic workup in the hospital.  Patient stable for discharge from a neurologic standpoint.    Felicie Morn PA-C [facility] [phone]  01/26/2011, 9:06 AM    Patient seen and examined. I agree with the above.  Thana Farr, MD [facility] [phone]  01/26/2011  9:48 AM

## 2011-01-26 NOTE — Progress Notes (Signed)
1 Day Post-Op  Subjective: Feels fine and pain controlled.  He has had some elevated blood sugars overnight despite sliding scale insulin.  His only complaint is right hand numbness and weakness.  Objective: Vital signs in last 24 hours: Temp:  [97.8 F (36.6 C)-98.9 F (37.2 C)] 97.9 F (36.6 C) (11/21 0600) Pulse Rate:  [67-95] 67  (11/21 0600) Resp:  [13-21] 18  (11/21 0600) BP: (102-134)/(33-67) 122/56 mmHg (11/21 0600) SpO2:  [87 %-100 %] 96 % (11/21 0600) Weight:  [247 lb 2.2 oz (112.1 kg)] 247 lb 2.2 oz (112.1 kg) (11/20 1214) Last BM Date: 01/24/11  Intake/Output from previous day: 11/20 0701 - 11/21 0700 In: 2491.3 [I.V.:2491.3] Out: 450 [Urine:300; Drains:100; Blood:50] Intake/Output this shift:    General appearance: alert, cooperative and no distress GI: soft, minimal incisional tenderness, appropriate, JP with ss output 50ml, no infection, no peritonitis Neurologic: Mental status: Alert, oriented, thought content appropriate Sensory: normal, no obvious defecit Motor: he has pretty normal grip in both hands, slightly decreased in the right, but otherwise full ROM and function of hand  Lab Results:  @LABLAST2 (wbc:2,hgb:2,hct:2,plt:2) BMET  Basename 01/25/11 2138  NA --  K --  CL --  CO2 --  GLUCOSE 388*  BUN --  CREATININE --  CALCIUM --   PT/INR No results found for this basename: LABPROT:2,INR:2 in the last 72 hours ABG No results found for this basename: PHART:2,PCO2:2,PO2:2,HCO3:2 in the last 72 hours  Studies/Results: Dg Cholangiogram Operative  01/25/2011  *RADIOLOGY REPORT*  Clinical Data:   cholelithiasis  INTRAOPERATIVE CHOLANGIOGRAM  Technique:  Cholangiographic images from the C-arm fluoroscopic device were submitted for interpretation post-operatively.  Please see the procedural report for the amount of contrast and the fluoroscopy time utilized.  Comparison:  none  Findings:  No persistent filling defects in the common duct. Intrahepatic  ducts are incompletely visualized, appearing decompressed centrally. Contrast passes into the duodenum.  IMPRESSION  Negative for retained common duct stone.  Original Report Authenticated By: Osa Craver, M.D.    Anti-infectives: Anti-infectives     Start     Dose/Rate Route Frequency Ordered Stop   01/24/11 1500   ceFAZolin (ANCEF) IVPB 2 g/50 mL premix        2 g 100 mL/hr over 30 Minutes Intravenous  Once 01/24/11 1457 01/25/11 0743          Assessment/Plan: s/p Procedure(s): LAPAROSCOPIC CHOLECYSTECTOMY WITH INTRAOPERATIVE CHOLANGIOGRAM Advance diet Will ask for neurology recommendations for evaluation and treatment If OK with neurology and continues to well, we will plan for discharge and resume all outpatient meds and treatments.  Will restart his home dose 70/30 and cover with SSI.  His elevated sugars may be exacerbated by stress dose steroids and postop state.  LOS: 1 day    [name] 01/26/2011

## 2011-01-26 NOTE — Progress Notes (Signed)
Pt. Had a blood sugar of 413 at about 2030. Notified Dr. Biagio Quint. Received new orders for a one time dose of insulin and increased sliding scale insulin. Also notified Dr. Biagio Quint of pt. Complaints of numbness and inability to grip with R hand. Did not note any right sided weakness. Will continue to monitor.

## 2011-01-26 NOTE — Progress Notes (Signed)
Discharge home. Home discharge instruction given , no questions verbalized. Patient is alert and oriented, denies any pain, not in any distress, incision intact.JP intact.

## 2011-01-26 NOTE — Progress Notes (Signed)
Spoke with patient about using CPAP. Pt. States he uses at home but did not bring it with him. Asked pt. If he wanted me to call Respiratory. Pt. Refused. He stated he will bring his in today if he has to stay another night.

## 2011-01-31 ENCOUNTER — Ambulatory Visit (INDEPENDENT_AMBULATORY_CARE_PROVIDER_SITE_OTHER): Payer: BC Managed Care – PPO | Admitting: General Surgery

## 2011-01-31 ENCOUNTER — Other Ambulatory Visit (INDEPENDENT_AMBULATORY_CARE_PROVIDER_SITE_OTHER): Payer: Self-pay

## 2011-01-31 ENCOUNTER — Encounter (INDEPENDENT_AMBULATORY_CARE_PROVIDER_SITE_OTHER): Payer: BC Managed Care – PPO | Admitting: General Surgery

## 2011-01-31 DIAGNOSIS — Z9049 Acquired absence of other specified parts of digestive tract: Secondary | ICD-10-CM

## 2011-01-31 DIAGNOSIS — Z9889 Other specified postprocedural states: Secondary | ICD-10-CM

## 2011-01-31 DIAGNOSIS — K912 Postsurgical malabsorption, not elsewhere classified: Secondary | ICD-10-CM

## 2011-01-31 NOTE — Discharge Summary (Signed)
Physician Discharge Summary  Patient ID: [name] MRN: [id number] DOB/AGE: 65-Mar-1947 65 y.o.  Admit date: 01/25/2011 Discharge date: 01/26/2011  Admission Diagnoses: cholelithiasis  Discharge Diagnoses: acute cholecystitis Active Problems: Diabetes mellitus Coronary artery disease cholelithiasis and cholecystitis  Discharged Condition: stable  Hospital Course: To OR for lap cholecystectomy 01/25/2011.  Admitted overnight for observation due to OSA, age, and difficulty of procedure.  No issues overnight and diet advanced.  Discharged home on POD 1.  Consults: neurology, for right hand numbness and weakness.  Significant Diagnostic Studies: radiology: Intraoperative cholangiogram  Treatments: surgery: lap cholecystectomy with [facility] 01/25/2011  Disposition: Home or Self Care  Discharge Orders    Future Appointments: Provider: Department: Dept Phone: Center:   01/31/2011 9:00 AM [facility] [phone] None   01/31/2011 3:00 PM Rulon Abide, DO [facility] [phone] None   05/02/2011 8:00 AM Sherrie George, MD [facility] [phone] None     Future Orders Please Complete By Expires   Diet - low sodium heart healthy      Diet - low sodium heart healthy      Increase activity slowly      Discharge instructions      Comments:   Follow up in general surgery clinic with Dr. Biagio Quint on Monday afternoon for possible drain removal. Continue to monitor blood sugars frequently to maintain tight glucose control.   Driving Restrictions      Comments:   No driving while on pain medications.   Discharge wound care:      Comments:   Strip and record your drain output at least twice daily. Bring record of the drain output with you at follow up visit.   Call MD for:  temperature >100.4      Call MD for:  persistant nausea and vomiting      Call MD for:  severe uncontrolled pain      Call MD for:  redness, tenderness, or signs of  infection (pain, swelling, redness, odor or green/yellow discharge around incision site)      Increase activity slowly      Discharge instructions      Comments:   Resume your home medications.  Follow up with me on Monday afternoon. [phone] Record drain output and bring with you to your follow up appointment.   Driving Restrictions      Comments:   No driving while taking pain medication.   Call MD for:  temperature >100.4      Call MD for:  persistant nausea and vomiting      Call MD for:  severe uncontrolled pain      Call MD for:  redness, tenderness, or signs of infection (pain, swelling, redness, odor or green/yellow discharge around incision site)        Discharge Medication List as of 01/26/2011  1:51 PM    CONTINUE these medications which have CHANGED   Details  HYDROcodone-acetaminophen (NORCO) 5-325 MG per tablet Take 1 tablet by mouth every 4 (four) hours as needed (pain)., Starting 01/26/2011, Until Sat 02/05/11, Print      CONTINUE these medications which have NOT CHANGED   Details  aspirin 81 MG tablet Take 81 mg by mouth daily.  , Until Discontinued, Historical Med    clopidogrel (PLAVIX) 75 MG tablet Take 75 mg by mouth daily.  , Until Discontinued, Historical Med    COENZYME Q-10 PO Take 1 tablet by mouth daily.  , Until Discontinued, Historical  Med    ezetimibe (ZETIA) 10 MG tablet Take 10 mg by mouth daily.  , Until Discontinued, Historical Med    fish oil-omega-3 fatty acids 1000 MG capsule Take 1 g by mouth daily. , Until Discontinued, Historical Med    furosemide (LASIX) 80 MG tablet Take 80 mg by mouth 2 (two) times daily.  , Until Discontinued, Historical Med    glipiZIDE (GLUCOTROL) 10 MG tablet Take 20 mg by mouth 2 (two) times daily.  , Until Discontinued, Historical Med    insulin aspart protamine-insulin aspart (NOVOLOG 70/30) (70-30) 100 UNIT/ML injection Inject 45-55 Units into the skin 3 (three) times daily with meals. Takes 3 times a day with a  meal.  If blood sugar <150, then uses 45 units.  If blood sugar greater than or equal 200 uses 55 units , Until Discontinued, Historical Med    isosorbide mononitrate (IMDUR) 30 MG 24 hr tablet Take 30 mg by mouth daily.  , Until Discontinued, Historical Med    KLOR-CON M10 10 MEQ tablet TAKE 2 TABLETS BY MOUTH TWICE A DAY, Normal    losartan (COZAAR) 100 MG tablet TAKE 1 TABLET BY MOUTH EVERY DAY, Normal    Multiple Vitamin (MULTIVITAMIN) capsule Take 1 capsule by mouth daily.  , Until Discontinued, Historical Med    predniSONE (DELTASONE) 10 MG tablet Take 10 mg by mouth daily. As directed , Until Discontinued, Historical Med         Signed: Lodema Pilot DAVID 01/31/2011, 7:14 AM

## 2011-01-31 NOTE — Progress Notes (Signed)
2 amber vials filled with body fluid taken from drain to evaluate Bilirubin levels, vials taken to [facility].  Patient will return to office today at 2:30 for follow up with Dr. Biagio Quint (s/p laparoscopic cholecystectomy) [facility] orders forms handwritten

## 2011-02-02 ENCOUNTER — Ambulatory Visit (INDEPENDENT_AMBULATORY_CARE_PROVIDER_SITE_OTHER): Payer: BC Managed Care – PPO | Admitting: General Surgery

## 2011-02-02 ENCOUNTER — Encounter (INDEPENDENT_AMBULATORY_CARE_PROVIDER_SITE_OTHER): Payer: Self-pay | Admitting: General Surgery

## 2011-02-02 VITALS — BP 122/84 | HR 68 | Temp 97.8°F | Resp 20 | Ht 70.5 in | Wt 239.6 lb

## 2011-02-02 DIAGNOSIS — Z4889 Encounter for other specified surgical aftercare: Secondary | ICD-10-CM

## 2011-02-02 DIAGNOSIS — Z5189 Encounter for other specified aftercare: Secondary | ICD-10-CM

## 2011-02-02 NOTE — Progress Notes (Signed)
Patient ID: [name], male   DOB: [birth year], 65 y.o.   MRN: [id number] HPI  This patient follows up approximately one week status post laparoscopic cholecystectomy. We left a drain in place due to the significant amount of inflammation in the area and he has been feeling much better since his procedure. He is now able to eat and is not having the nausea and discomfort in the mornings. He still has a slight amount of postoperative discomfort but he is only taking approximately one pain pill per day. His appetite is down but his bowels are functional and otherwise feels very well.  Review of Systems   Objective:   Physical Exam  No acute distress and nontoxic-appearing  His abdomen is soft and nontender on exam his incisions are healing well without sign of infection. He has a JP drain to his lateral trocar site with serosanguineous output and total bilirubin level was normal on this at 0.9   Assessment:    Status post laparoscopic cholecystectomy-doing well  His pathology was benign and he is doing well his symptoms have improved since his surgery. He seems to be doing fairly well and appropriate postoperative state. No evidence of postoperative complications.   Plan:    Status post laparoscopic cholecystectomy-doing well  His pathology was benign and he is doing well his symptoms have improved since his surgery. He seems to be doing fairly well and appropriate postoperative state. No evidence of postoperative complications.

## 2011-03-16 ENCOUNTER — Other Ambulatory Visit: Payer: Self-pay | Admitting: Critical Care Medicine

## 2011-03-28 ENCOUNTER — Other Ambulatory Visit: Payer: Self-pay | Admitting: Critical Care Medicine

## 2011-05-02 ENCOUNTER — Ambulatory Visit (INDEPENDENT_AMBULATORY_CARE_PROVIDER_SITE_OTHER): Payer: BC Managed Care – PPO | Admitting: Ophthalmology

## 2011-05-18 ENCOUNTER — Ambulatory Visit (INDEPENDENT_AMBULATORY_CARE_PROVIDER_SITE_OTHER): Payer: BC Managed Care – PPO | Admitting: Ophthalmology

## 2011-05-30 ENCOUNTER — Ambulatory Visit (INDEPENDENT_AMBULATORY_CARE_PROVIDER_SITE_OTHER): Payer: BC Managed Care – PPO | Admitting: Critical Care Medicine

## 2011-05-30 ENCOUNTER — Encounter: Payer: Self-pay | Admitting: Critical Care Medicine

## 2011-05-30 VITALS — BP 110/72 | HR 60 | Temp 98.0°F | Ht 70.0 in | Wt 247.0 lb

## 2011-05-30 DIAGNOSIS — J329 Chronic sinusitis, unspecified: Secondary | ICD-10-CM | POA: Insufficient documentation

## 2011-05-30 DIAGNOSIS — D869 Sarcoidosis, unspecified: Secondary | ICD-10-CM

## 2011-05-30 MED ORDER — PREDNISONE 10 MG PO TABS: 10.0000 mg | ORAL_TABLET | Freq: Every day | ORAL | Status: DC

## 2011-05-30 MED ORDER — AMOXICILLIN-POT CLAVULANATE 875-125 MG PO TABS: 1.0000 | ORAL_TABLET | Freq: Two times a day (BID) | ORAL | Status: AC

## 2011-05-30 MED ORDER — FLUTICASONE PROPIONATE 50 MCG/ACT NA SUSP: 2.0000 | Freq: Every day | NASAL | Status: DC

## 2011-05-30 NOTE — Assessment & Plan Note (Signed)
Stable pulmonary sarcoidosis stage II Plan Maintain prednisone 10 mg daily The patient has not been able to titrate off prednisone entirely due to airway inflammation

## 2011-05-30 NOTE — Progress Notes (Signed)
Subjective:    Patient ID: [name], male    DOB: [birth year], 66 y.o.   MRN: [id number]  HPI   This is a 66 y.o.   white male with a history of severe pulmonary sarcoidosis with primary mediastinal and hilar lymph node involvement. The patient also has elements of parenchymal involvement. Also severe OSA. The patient had failed Arava therapy. The patient also had adverse side effects Imuran.  Failed Arava and Imuran due to side effects.   05/30/2011 Pt has GB resected, was infected.  11/12.  Since this time has done better. Now no cough, notes some sneezing. Dyspnea is at baseline.  Pt notes sinus congestion and mucus. Mucus out of nose is bloody and dark.  Notes eye sinus pressure for 3-4 days. Pt denies any significant sore throat, nasal congestion or excess secretions, fever, chills, sweats, unintended weight loss, pleurtic or exertional chest pain, orthopnea PND, or leg swelling Pt denies any increase in rescue therapy over baseline, denies waking up needing it or having any early am or nocturnal exacerbations of coughing/wheezing/or dyspnea. Pt also denies any obvious fluctuation in symptoms with  weather or environmental change or other alleviating or aggravating factors     Past Medical History  Diagnosis Date  . Type 2 diabetes mellitus   . Sarcoid   . Adrenal insufficiency   . Hypertension   . OSA (obstructive sleep apnea)     uses VPAC sleep study 2 years done through [place]. Dr. Anne Fu arranged study  . Coronary artery disease     has stents  . Heart attack 2010  . Pneumonia     3-4 years ago  . Diabetes mellitus     insulin and pills     Family History  Problem Relation Age of Onset  . Asthma Sister   . Anesthesia problems Sister     "Kidney's did not wake up"  . Kidney disease Mother   . Diabetes Mother   . Kidney cancer Mother   . Cancer Mother     kidney  . Heart attack Father      History   Social History  . Marital Status: Married    Spouse  Name: N/A    Number of Children: 3  . Years of Education: N/A   Occupational History  . 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 Smoking status: Never Smoker   . Smokeless tobacco: Never Used  . Alcohol Use: No  . Drug Use: No  . Sexually Active: Not on file   Other Topics Concern  . Not on file   Social History Narrative  . No narrative on file     Allergies  Allergen Reactions  . Hydrocortisone Nausea Only  . Statins Other (See Comments)    Pt says they make him "mean"     Outpatient Prescriptions Prior to Visit  Medication Sig Dispense Refill  . aspirin 81 MG tablet Take 81 mg by mouth daily.        . clopidogrel (PLAVIX) 75 MG tablet Take 75 mg by mouth daily.        Marland Kitchen COENZYME Q-10 PO Take 1 tablet by mouth daily.        Marland Kitchen ezetimibe (ZETIA) 10 MG tablet Take 10 mg by mouth daily.        . fish oil-omega-3 fatty acids 1000 MG capsule Take 1 g by mouth daily.       Marland Kitchen  furosemide (LASIX) 80 MG tablet Take 80 mg by mouth 2 (two) times daily.        Marland Kitchen glipiZIDE (GLUCOTROL) 10 MG tablet Take 20 mg by mouth 2 (two) times daily.        . insulin aspart protamine-insulin aspart (NOVOLOG 70/30) (70-30) 100 UNIT/ML injection Inject 45-55 Units into the skin 3 (three) times daily with meals. Takes 3 times a day with a meal.  If blood sugar <150, then uses 45 units.  If blood sugar greater than or equal 200 uses 55 units       . isosorbide mononitrate (IMDUR) 30 MG 24 hr tablet Take 30 mg by mouth daily.        Marland Kitchen KLOR-CON M10 10 MEQ tablet TAKE 2 TABLETS BY MOUTH TWICE A DAY  360 tablet  0  . losartan (COZAAR) 100 MG tablet TAKE 1 TABLET BY MOUTH EVERY DAY  30 tablet  2  . Multiple Vitamin (MULTIVITAMIN) capsule Take 1 capsule by mouth daily.        Marland Kitchen KLOR-CON M10 10 MEQ tablet TAKE 2 TABLETS BY MOUTH TWICE A DAY  360 tablet  0  . predniSONE (DELTASONE) 10 MG tablet Take 10 mg by mouth daily. As directed          Review of  Systems  Constitutional:   No  weight loss, night sweats,  Fevers, chills, fatigue, lassitude. HEENT:   No headaches,  Difficulty swallowing,  Tooth/dental problems,  Sore throat,                No sneezing, itching, ear ache, nasal congestion, post nasal drip,   CV:  No chest pain,  Orthopnea, PND, swelling in lower extremities, anasarca, dizziness, palpitations  GI  No heartburn, indigestion, abdominal pain, nausea, vomiting, diarrhea, change in bowel habits, loss of appetite  Resp: Notes shortness of breath with exertion or at rest.  No excess mucus, no productive cough,  No non-productive cough,  No coughing up of blood.  Notes  change in color of mucus.  No wheezing.  No chest wall deformity  Skin: no rash or lesions.  GU: no dysuria, change in color of urine, no urgency or frequency.  No flank pain.  MS:  No joint pain or swelling.  No decreased range of motion.  No back pain.  Psych:  No change in mood or affect. No depression or anxiety.  No memory loss.      Objective:   Physical Exam      Filed Vitals:   05/30/11 1021  BP: 110/72  Pulse: 60  Temp: 98 F (36.7 C)  TempSrc: Oral  Height: 5\' 10"  (1.778 m)  Weight: 247 lb (112.038 kg)  SpO2: 97%    Gen: Pleasant, cushingoid facies , in no distress,  normal affect  ENT: No lesions,  mouth clear,  oropharynx clear, +++  postnasal drip  Neck: No JVD, no TMG, no carotid bruits  Lungs: No use of accessory muscles, no dullness to percussion, distant BS Cardiovascular: RRR, heart sounds normal, no murmur or gallops, no peripheral edema  Abdomen: soft and NT, no HSM,  BS normal  Musculoskeletal: No deformities, no cyanosis or clubbing  Neuro: alert, non focal  Skin: Warm, no lesions or rashes PFTs 2010 PFT Conversion 01/04/2009  FVC 2.42  FVC PREDICT 4.54  FVC  % Predicted 53  FEV1 2.03  FEV1 PREDICT 3.16  FEV % Predicted 64  FEV1/FVC 83.9  FEV1/FVC PRE 120  FeF  25-75 2.82  FeF 25-75 % Predicted 2.97   FEF % EXPEC 94   Assessment & Plan:   Sarcoidosis Stable pulmonary sarcoidosis stage II Plan Maintain prednisone 10 mg daily The patient has not been able to titrate off prednisone entirely due to airway inflammation  Sinusitis, chronic Chronic sinusitis with acute component Plan Begin Augmentin for 10 days Continue nasal hygiene     Updated Medication List Outpatient Encounter Prescriptions as of 05/30/2011  Medication Sig Dispense Refill  . aspirin 81 MG tablet Take 81 mg by mouth daily.        . clopidogrel (PLAVIX) 75 MG tablet Take 75 mg by mouth daily.        Marland Kitchen COENZYME Q-10 PO Take 1 tablet by mouth daily.        Marland Kitchen ezetimibe (ZETIA) 10 MG tablet Take 10 mg by mouth daily.        . fish oil-omega-3 fatty acids 1000 MG capsule Take 1 g by mouth daily.       . furosemide (LASIX) 80 MG tablet Take 80 mg by mouth 2 (two) times daily.        Marland Kitchen glipiZIDE (GLUCOTROL) 10 MG tablet Take 20 mg by mouth 2 (two) times daily.        . insulin aspart protamine-insulin aspart (NOVOLOG 70/30) (70-30) 100 UNIT/ML injection Inject 45-55 Units into the skin 3 (three) times daily with meals. Takes 3 times a day with a meal.  If blood sugar <150, then uses 45 units.  If blood sugar greater than or equal 200 uses 55 units       . isosorbide mononitrate (IMDUR) 30 MG 24 hr tablet Take 30 mg by mouth daily.        Marland Kitchen KLOR-CON M10 10 MEQ tablet TAKE 2 TABLETS BY MOUTH TWICE A DAY  360 tablet  0  . losartan (COZAAR) 100 MG tablet TAKE 1 TABLET BY MOUTH EVERY DAY  30 tablet  2  . Multiple Vitamin (MULTIVITAMIN) capsule Take 1 capsule by mouth daily.        . predniSONE (DELTASONE) 10 MG tablet Take 1 tablet (10 mg total) by mouth daily. As directed  90 tablet  4  . DISCONTD: KLOR-CON M10 10 MEQ tablet TAKE 2 TABLETS BY MOUTH TWICE A DAY  360 tablet  0  . DISCONTD: predniSONE (DELTASONE) 10 MG tablet Take 10 mg by mouth daily. As directed       . amoxicillin-clavulanate (AUGMENTIN) 875-125 MG per  tablet Take 1 tablet by mouth 2 (two) times daily.  20 tablet  0  . fluticasone (FLONASE) 50 MCG/ACT nasal spray Place 2 sprays into the nose daily.  16 g  6

## 2011-05-30 NOTE — Patient Instructions (Signed)
Start fluticasone two puff twice daily Start augmentin twice daily Rinse sinuses daily with salt water spray Prednisone daily  Return 6 months

## 2011-05-30 NOTE — Assessment & Plan Note (Signed)
Chronic sinusitis with acute component Plan Begin Augmentin for 10 days Continue nasal hygiene

## 2011-06-01 ENCOUNTER — Other Ambulatory Visit: Payer: Self-pay | Admitting: *Deleted

## 2011-06-01 MED ORDER — LOSARTAN POTASSIUM 100 MG PO TABS: ORAL_TABLET | ORAL | Status: DC

## 2011-06-02 ENCOUNTER — Other Ambulatory Visit: Payer: Self-pay | Admitting: *Deleted

## 2011-06-02 MED ORDER — LOSARTAN POTASSIUM 100 MG PO TABS: ORAL_TABLET | ORAL | Status: DC

## 2011-06-02 NOTE — Telephone Encounter (Signed)
Rx was sent yesterday on losartan to [facility] but for only 30 days.  I cancelled this with [facility] yesterday as they were requesting a 90 day rx.

## 2011-08-16 ENCOUNTER — Other Ambulatory Visit: Payer: Self-pay | Admitting: Critical Care Medicine

## 2011-10-05 ENCOUNTER — Encounter (HOSPITAL_COMMUNITY): Payer: Self-pay | Admitting: Pharmacy Technician

## 2011-10-06 ENCOUNTER — Other Ambulatory Visit: Payer: Self-pay | Admitting: Cardiology

## 2011-10-10 NOTE — H&P (Signed)
Office Visit     Patient: [name], [name] Provider: Davidson Palmieri, MD  DOB: 10/04/1945 Age: 65 Y Sex: Male Date: 10/05/2011  Phone: [phone]   Address: [address], [place], [place]  Pcp: WILLIAM R HARRIS, IV        Subjective:     CC:    1. 2WK FOLLOW UP.        HPI:  General:  65-year-old with coronary artery disease, DES to LAD and PL (8/11). He ended up having his gallbladder removed in November of 2012. Difficult surgery. Infected gallbladder. his wife states that he is not in the same since. Increased shortness of breath, fatigue. He has pulmonary sarcoidosis. Multiple lymph node involvement from PET scan. Continuing with prednisone, no change in dosing. He is also on furosemide 80 mg twice a day. He has not had any significant weight gain. He gets quite fatigued/winded with minimal activity. He does not describe any chest pain however. She is concerned because some of the symptoms are similar to that prior to his stent placements. He is having very complicated medical course..        ROS:  Positive for dyspnea on exertion, increased fatigue, diabetes. No bleeding, no strokelike symptoms. Compliant with medications. All others negative.       Medical History: CAD- DES LAD 11/15/07 with PTCA D1 at bifurcation, CATH 10/15/09 - 95% second PL branch distal RCA - med mgt. (started Imdur), if symptoms not relieved, consider PCI, when compared to prior catheterization, territory involved is moderate in sized- discussed with Dr. Varanasi, Type 2 diabetes mellitus, Hyperlipidemia/mixed - h/o statin intolerance, Hypertension, Obesity, Abdominal pain , Hernia-surgeon requesting repair., Echocardiogram 11/07/07-mild asymmetric septal hypertrophy, normal ejection fraction, grade 1 diastolic dysfunction., Sarcoidosis - Dr. Wright -on chronic prednisone therapy, when he stops he has extreme lower extremity discomfort, Bradycardia - stopped Bystolic, metop, Sleep apnea - Dr. Turner/ Dr. Wright,  Hypogonadism.        Family History: Father: deceased MI Mother: deceased Diabetes Mellitus, Kidney Cancer Paternal Grand Father: deceased [place] Paternal Grand Mother: deceased Unknown Maternal Grand Father: deceased Diabetes Mellitus Maternal Grand Mother: deceased Unknown Sister 1: alive Thyroid Problems, stomach and bowel problems Sister 2: alive Unknown 2 sister(s) .        Social History:  General:  History of smoking cigarettes: Never smoked.  no Smoking.  no Tobacco Exposure.  no Alcohol.  Caffeine: yes, 2-3 servings per day.  no Recreational drug use.  no Exercise.  Occupation: employed, [employer] in [place].  Marital Status: married.  Children: 3.        Medications: PredniSONE 10 MG Tablet 1 tablet Once a day, Nitrostat 0.4 MG Tablet Sublingual PLACE 1 TABLET UNDER THE TONGUE EVERY 5 MINUTES AS NEEDED (UP TO 3 DOSES) , BD Insulin Syringe 30G X 1/2" 0.5 ML Miscellaneous USE AS DIRECTED Dx:250.02, Aspirin 81mg Tablet Dispersible 1 tablet Once a day, Centrum Cardio Tablet 1 tablet twice a day, Fish Oil 1000 MG Capsule 1 tablet 5 per day, Losartan Potassium 100 MG Tablet 1 tablet once a day, Potassium Chloride 10 MEQ Tablet Extended Release 2 caps twice a day, Imdur 30 MG Tablet Extended Release 24 Hour 1 tablet Once a day, Plavix 75 MG Tablet 1 tablet Once a day, Axiron 30 mg/1.5 ml (1 pump) Liquid (alcohol based Apply one pump every morning after shower, Fenofibrate Micronized 67 MG Capsule 1 capsule with a meal Once a day with food, Zetia 10 MG Tablet   1 tablet Once a day, Levofloxacin 500 MG Tablet 1 tablet Once a day, Omeprazole 40 MG Capsule Delayed Release 1 capsule Once a day, Furosemide 80 MG Tablet 1 tablet twice a day, GlipiZIDE 10 MG Tablet 1 tablet twice a day, Novolin 70/30 70-30 % Suspension INJECT 45 TO 55 UNITS 3 TIMES A DAY , Medication List reviewed and reconciled with the patient       Allergies: N.K.D.A.      Objective:     Vitals: Wt 245, Wt change -2  lb, Ht 69.25, BMI 35.92, Pulse sitting 88, BP sitting 134/80.       Examination:  Cardiology, General:  GENERAL APPEARANCE: pleasant, NAD.  HEENT: unremarkable.  CAROTID UPSTROKE: normal, no bruit.  JVD: flat.  HEART SOUNDS: regular, normal S1, S2, no S3 or S4.  MURMUR: absent.  LUNGS: no rales or wheezes.  ABDOMEN: soft, non tender, positive bowel sounds, no masses felt, obese.  EXTREMITIES: Trace edema, 2+ radial pulses.  PERIPHERAL PULSES: 2 plus bilateral.        Assessment:     Assessment:  1. Coronary atherosclerosis of unspecified type of vessel, native or graft - 414.00 (Primary)  2. Obesity, unspecified - 278.00  3. Essential hypertension, benign - 401.1  4. Dyspnea - 786.05  5. Angina Pectoris - 413.9    Plan:     1. Coronary atherosclerosis of unspecified type of vessel, native or graft  LAB: PT (Prothrombin Time) (005199) (Ordered for 10/05/2011) Normal    Prothrombin Time 10.4 9.1-12.0 - SEC    INR 1.0 0.8-1.2 -     Takhia Spoon 10/06/2011 07:28:20 AM > ok for cath, pre and post hydration. Hold lasix day prior to procedure. Plummer,Wanda 10/06/2011 10:45:16 AM > Pt. notified.    LAB: Basic Metabolic (Ordered for 10/05/2011) Creat 1.39    GLUCOSE 365 70-99 - mg/dL H   BUN 22 6-26 - mg/dL    CREATININE 1.39 0.60-1.30 - mg/dl H   eGFR (NON-AFRICAN AMERICAN) 51 >60 - calc L   eGFR (AFRICAN AMERICAN) 62 >60 - calc    SODIUM 135 136-145 - mmol/L L   POTASSIUM 3.7 3.5-5.5 - mmol/L    CHLORIDE 98 98-107 - mmol/L    C02 31 22-32 - mg/dL    ANION GAP 9.6 6.0-20.0 - mmol/L    CALCIUM 9.5 8.6-10.3 - mg/dL     Johntay Doolen 10/06/2011 07:28:20 AM > ok for cath, pre and post hydration. Hold lasix day prior to procedure. Plummer,Wanda 10/06/2011 10:36:58 AM > Pt. notified.    LAB: CBC with Diff (Ordered for 10/05/2011) Hg 13.5    WBC 11.0 4.0-11.0 - K/ul    RBC 4.17 4.20-5.80 - M/uL L   HGB 13.5 13.0-17.0 - g/dL    HCT 40.9 39.0-52.0 - %    MCH 32.4 27.0-33.0 - pg      MPV 8.1 7.5-10.7 - fL    MCV 98.2 80.0-94.0 - fL H   MCHC 33.0 32.0-36.0 - g/dL    RDW 13.3 11.5-15.5 - %    NRBC# 0.00 -    PLT 273 150-400 - K/uL    NEUT % 76.4 43.3-71.9 - % H   NRBC% 0.00 - %    LYMPH% 14.7 16.8-43.5 - % L   MONO % 7.3 4.6-12.4 - %    EOS % 0.8 0.0-7.8 - %    BASO % 0.8 0.0-1.0 - %    NEUT # 8.4 1.9-7.2 - K/uL H   LYMPH# 1.60 1.10-2.70 -   K/uL    MONO # 0.8 0.3-0.8 - K/uL    EOS # 0.1 0.0-0.6 - K/uL    BASO # 0.1 0.0-0.1 - K/uL     Kaylob Wallen 10/06/2011 07:28:20 AM > ok for cath, pre and post hydration. Hold lasix day prior to procedure. Plummer,Wanda 10/06/2011 10:36:46 AM > Pt. notified.    LAB: Basic Metabolic (Ordered for 10/05/2011) Creat 1.39    GLUCOSE 365 70-99 - mg/dL H   BUN 22 6-26 - mg/dL    CREATININE 1.39 0.60-1.30 - mg/dl H   eGFR (NON-AFRICAN AMERICAN) 51 >60 - calc L   eGFR (AFRICAN AMERICAN) 62 >60 - calc    SODIUM 135 136-145 - mmol/L L   POTASSIUM 3.7 3.5-5.5 - mmol/L    CHLORIDE 98 98-107 - mmol/L    C02 31 22-32 - mg/dL    ANION GAP 9.6 6.0-20.0 - mmol/L    CALCIUM 9.5 8.6-10.3 - mg/dL     Uriah Philipson 10/06/2011 07:28:20 AM > ok for cath, pre and post hydration. Hold lasix day prior to procedure. Plummer,Wanda 10/06/2011 10:36:58 AM > Pt. notified.    LAB: PT (Prothrombin Time) (005199) (Ordered for 10/05/2011) Normal    Prothrombin Time 10.4 9.1-12.0 - SEC    INR 1.0 0.8-1.2 -     Zeenat Jeanbaptiste 10/06/2011 07:28:20 AM > ok for cath, pre and post hydration. Hold lasix day prior to procedure. Plummer,Wanda 10/06/2011 10:45:16 AM > Pt. notified.    LAB: CBC with Diff (Ordered for 10/05/2011) Hg 13.5    WBC 11.0 4.0-11.0 - K/ul    RBC 4.17 4.20-5.80 - M/uL L   HGB 13.5 13.0-17.0 - g/dL    HCT 40.9 39.0-52.0 - %    MCH 32.4 27.0-33.0 - pg    MPV 8.1 7.5-10.7 - fL    MCV 98.2 80.0-94.0 - fL H   MCHC 33.0 32.0-36.0 - g/dL    RDW 13.3 11.5-15.5 - %    NRBC# 0.00 -    PLT 273 150-400 - K/uL    NEUT % 76.4 43.3-71.9 - % H   NRBC%  0.00 - %    LYMPH% 14.7 16.8-43.5 - % L   MONO % 7.3 4.6-12.4 - %    EOS % 0.8 0.0-7.8 - %    BASO % 0.8 0.0-1.0 - %    NEUT # 8.4 1.9-7.2 - K/uL H   LYMPH# 1.60 1.10-2.70 - K/uL    MONO # 0.8 0.3-0.8 - K/uL    EOS # 0.1 0.0-0.6 - K/uL    BASO # 0.1 0.0-0.1 - K/uL     Legaci Tarman 10/06/2011 07:28:20 AM > ok for cath, pre and post hydration. Hold lasix day prior to procedure. Plummer,Wanda 10/06/2011 10:36:46 AM > Pt. notified.    Since his symptoms are somewhat similar to his prior coronary artery disease symptoms, I will go ahead and pursue cardiac catheterization. It has been 2 years since last evaluation. He states that after the last stent was placed, posterior lateral branch, he felt the best he felt in 5 years. So far, echocardiogram demonstrates normal ejection fraction with mildly enlarged right ventricle, diastolic dysfunction. His BNP was 15, excellent. Creatinine is normal. CBC is unremarkable. Recent lung evaluation reassuring. Risks and benefits of cardiac catheterization have been reviewed including risk of stroke, heart attack, death, bleeding, renal impariment and arterial damage. There was ample oppurtuny to answer questions. Alternatives were discussed. Patient understands and wishes to proceed.       2. Obesity, unspecified    Continue to encourage weight loss.       3. Essential hypertension, benign  Currently well controlled.       4. Dyspnea  May be angina. May be in part from diastolic dysfunction. BMP reassuring. Continue with furosemide 80 mg twice a day.        Immunizations:        Labs:        Procedure Codes: 36415 BLOOD COLLECTION ROUTINE VENIPUNCTURE, 85025 ECL CBC PLATELET DIFF, 80048 ECL BMP       Preventive:         Follow Up: post cath      Provider: Yasuko Lapage, MD  Patient: [name], [name] DOB: 10/07/1945 Date: 10/05/2011    

## 2011-10-12 ENCOUNTER — Encounter (HOSPITAL_COMMUNITY)
Admission: RE | Disposition: A | Discharge status: Home / Self Care | Payer: Self-pay | Source: Ambulatory Visit | Attending: Cardiology

## 2011-10-12 ENCOUNTER — Encounter (HOSPITAL_COMMUNITY): Payer: Self-pay | Admitting: Cardiology

## 2011-10-12 ENCOUNTER — Ambulatory Visit (HOSPITAL_COMMUNITY)
Admission: RE | Admit: 2011-10-12 | Discharge: 2011-10-12 | Disposition: A | Discharge status: Home / Self Care | Payer: BC Managed Care – PPO | Source: Ambulatory Visit | Attending: Cardiology | Admitting: Cardiology

## 2011-10-12 DIAGNOSIS — E119 Type 2 diabetes mellitus without complications: Secondary | ICD-10-CM | POA: Insufficient documentation

## 2011-10-12 DIAGNOSIS — R0609 Other forms of dyspnea: Secondary | ICD-10-CM | POA: Insufficient documentation

## 2011-10-12 DIAGNOSIS — R0989 Other specified symptoms and signs involving the circulatory and respiratory systems: Secondary | ICD-10-CM | POA: Insufficient documentation

## 2011-10-12 DIAGNOSIS — G4730 Sleep apnea, unspecified: Secondary | ICD-10-CM | POA: Insufficient documentation

## 2011-10-12 DIAGNOSIS — R5381 Other malaise: Secondary | ICD-10-CM | POA: Insufficient documentation

## 2011-10-12 DIAGNOSIS — Z9861 Coronary angioplasty status: Secondary | ICD-10-CM | POA: Insufficient documentation

## 2011-10-12 DIAGNOSIS — I2510 Atherosclerotic heart disease of native coronary artery without angina pectoris: Secondary | ICD-10-CM | POA: Insufficient documentation

## 2011-10-12 HISTORY — DX: Atherosclerotic heart disease of native coronary artery without angina pectoris: I25.10

## 2011-10-12 HISTORY — PX: LEFT HEART CATHETERIZATION WITH CORONARY ANGIOGRAM: SHX5451

## 2011-10-12 LAB — GLUCOSE, CAPILLARY
Glucose-Capillary: 102 mg/dL — ABNORMAL HIGH (ref 70–99)
Glucose-Capillary: 94 mg/dL (ref 70–99)

## 2011-10-12 SURGERY — LEFT HEART CATHETERIZATION WITH CORONARY ANGIOGRAM
Anesthesia: LOCAL | Laterality: Bilateral

## 2011-10-12 MED ORDER — DIAZEPAM 5 MG PO TABS
5.0000 mg | ORAL_TABLET | ORAL | Status: AC
  Administered 2011-10-12: 5 mg via ORAL

## 2011-10-12 MED ORDER — ASPIRIN 81 MG PO CHEW
CHEWABLE_TABLET | ORAL | Status: AC
  Administered 2011-10-12: 324 mg via ORAL
  Filled 2011-10-12: qty 4

## 2011-10-12 MED ORDER — SODIUM CHLORIDE 0.9 % IJ SOLN: 3.0000 mL | INTRAMUSCULAR | Status: DC | PRN

## 2011-10-12 MED ORDER — ASPIRIN 81 MG PO CHEW
324.0000 mg | CHEWABLE_TABLET | ORAL | Status: AC
  Administered 2011-10-12: 324 mg via ORAL

## 2011-10-12 MED ORDER — SODIUM CHLORIDE 0.9 % IJ SOLN: 3.0000 mL | Freq: Two times a day (BID) | INTRAMUSCULAR | Status: DC

## 2011-10-12 MED ORDER — SODIUM CHLORIDE 0.9 % IV SOLN: 250.0000 mL | INTRAVENOUS | Status: DC | PRN

## 2011-10-12 MED ORDER — FENTANYL CITRATE 0.05 MG/ML IJ SOLN
INTRAMUSCULAR | Status: AC
  Filled 2011-10-12: qty 2

## 2011-10-12 MED ORDER — DIAZEPAM 5 MG PO TABS
ORAL_TABLET | ORAL | Status: AC
  Administered 2011-10-12: 5 mg via ORAL
  Filled 2011-10-12: qty 1

## 2011-10-12 MED ORDER — HEPARIN (PORCINE) IN NACL 2-0.9 UNIT/ML-% IJ SOLN
INTRAMUSCULAR | Status: AC
  Filled 2011-10-12: qty 2000

## 2011-10-12 MED ORDER — NITROGLYCERIN 0.2 MG/ML ON CALL CATH LAB
INTRAVENOUS | Status: AC
  Filled 2011-10-12: qty 1

## 2011-10-12 MED ORDER — VERAPAMIL HCL 2.5 MG/ML IV SOLN
INTRAVENOUS | Status: AC
  Filled 2011-10-12: qty 2

## 2011-10-12 MED ORDER — LIDOCAINE HCL (PF) 1 % IJ SOLN
INTRAMUSCULAR | Status: AC
  Filled 2011-10-12: qty 30

## 2011-10-12 MED ORDER — MIDAZOLAM HCL 2 MG/2ML IJ SOLN
INTRAMUSCULAR | Status: AC
  Filled 2011-10-12: qty 2

## 2011-10-12 MED ORDER — SODIUM CHLORIDE 0.9 % IV SOLN
INTRAVENOUS | Status: DC
  Administered 2011-10-12: 09:00:00 via INTRAVENOUS

## 2011-10-12 NOTE — CV Procedure (Addendum)
PROCEDURE:  Left heart catheterization with selective coronary angiography, left ventriculogram via the radial artery approach.  INDICATIONS:  66 year old with diabetes, pulmonary circumflex stenosis, statin intolerance, adrenal insufficiency, sleep apnea who has been experiencing symptoms very similar to that prior to his stent placement in his proximal LAD. Dyspnea, fatigue. He has not felt well since his gallbladder surgery.  The risks, benefits, and details of the procedure were explained to the patient, including possibilities of stroke, heart attack, death, renal impairment, arterial damage, bleeding.  The patient verbalized understanding and wanted to proceed.  Informed written consent was obtained.  PROCEDURE TECHNIQUE:  Allen's test was performed pre-and post procedure and was normal. The right radial artery site was prepped and draped in a sterile fashion. One percent lidocaine was used for local anesthesia. Using the modified Seldinger technique a 5 French hydrophilic sheath was inserted into the radial artery without difficulty. 3 mg of verapamil was administered via the sheath. A Judkins right #4 catheter with the guidance of a Versicore wire was placed in the right coronary cusp and selectively cannulated the right coronary artery. After traversing the aortic arch, 5000 units of heparin IV was administered. A Judkins left #3.5 catheter was used to selectively cannulate the left main artery. Multiple views with hand injection of Omnipaque were obtained. Intracoronary nitroglycerin administered. Catheter a pigtail catheter was used to cross into the left ventricle, hemodynamics were obtained, and a left ventriculogram was performed in the RAO position with power injection. Following the procedure, sheath was removed, patient was hemodynamically stable, hemostasis was maintained with a Terumo T band.   CONTRAST:  Total of 60 ml.    FLOUROSCOPY TIME: 3.8 min.  COMPLICATIONS:  None.     HEMODYNAMICS:  Aortic pressure was 128/64 mmHg; LV systolic pressure was ; LVEDP .  There was no gradient between the left ventricle and aorta.    ANGIOGRAPHIC DATA:    Left main: No angiographically significant disease  Left anterior descending (LAD): Proximal LAD stent widely patent  Circumflex artery (CIRC): Small caliber AV groove circumflex with moderate stenosis in the mid vessel, otherwise OM's with minor luminal irregularities  Right coronary artery (RCA): Dominant vessel giving rise to the posterior descending artery, minor luminal irregularities especially distally. PL stent widely patent.  LEFT VENTRICULOGRAM:  Left ventricular angiogram was done in the 30 RAO projection and revealed normal left ventricular wall motion and systolic function with an estimated ejection fraction of 55 %.   IMPRESSIONS:  Widely patent LAD and PL stent with otherwise minor luminal irregularities. Vessels are small in caliber, diabetic appearance. Normal left ventricular systolic function.  LVEDP 17 mmHg.  Ejection fraction 55%.  RECOMMENDATION:  Reassurance. Continue with medical management. Left ventricular end-diastolic pressure mildly elevated. Continue to treat diastolic dysfunction.

## 2011-10-12 NOTE — H&P (View-Only) (Signed)
Office Visit     Patient: [name], [name] Provider: Donato Schultz, MD  DOB: Oct 15, 1945 Age: 66 Y Sex: Male Date: 10/05/2011  Phone: [phone]   Address: [address], [place], [place]  Pcp: Tawni Pummel HARRIS, IV        Subjective:     CC:    1. 2WK FOLLOW UP.        HPI:  General:  66 year old with coronary artery disease, DES to LAD and PL (8/11). He ended up having his gallbladder removed in November of 2012. Difficult surgery. Infected gallbladder. his wife states that he is not in the same since. Increased shortness of breath, fatigue. He has pulmonary sarcoidosis. Multiple lymph node involvement from PET scan. Continuing with prednisone, no change in dosing. He is also on furosemide 80 mg twice a day. He has not had any significant weight gain. He gets quite fatigued/winded with minimal activity. He does not describe any chest pain however. She is concerned because some of the symptoms are similar to that prior to his stent placements. He is having very complicated medical course..        ROS:  Positive for dyspnea on exertion, increased fatigue, diabetes. No bleeding, no strokelike symptoms. Compliant with medications. All others negative.       Medical History: CAD- DES LAD 11/15/07 with PTCA D1 at bifurcation, CATH 10/15/09 - 95% second PL branch distal RCA - med mgt. (started Imdur), if symptoms not relieved, consider PCI, when compared to prior catheterization, territory involved is moderate in sized- discussed with Dr. Eldridge Dace, Type 2 diabetes mellitus, Hyperlipidemia/mixed - h/o statin intolerance, Hypertension, Obesity, Abdominal pain , Hernia-surgeon requesting repair., Echocardiogram 11/07/07-mild asymmetric septal hypertrophy, normal ejection fraction, grade 1 diastolic dysfunction., Sarcoidosis - Dr. Delford Field -on chronic prednisone therapy, when he stops he has extreme lower extremity discomfort, Bradycardia - stopped Bystolic, metop, Sleep apnea - Dr. Mayford Knife Dr. Delford Field,  Hypogonadism.        Family History: Father: deceased MI Mother: deceased Diabetes Mellitus, Kidney Cancer Paternal Grand Father: deceased [place] Paternal Grand Mother: deceased Unknown Maternal Grand Father: deceased Diabetes Mellitus Maternal Grand Mother: deceased Unknown Sister 1: alive Thyroid Problems, stomach and bowel problems Sister 2: alive Unknown 2 sister(s) .        Social History:  General:  History of smoking cigarettes: Never smoked.  no Smoking.  no Tobacco Exposure.  no Alcohol.  Caffeine: yes, 2-3 servings per day.  no Recreational drug use.  no Exercise.  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  Marital Status: married.  Children: 3.        Medications: PredniSONE 10 MG Tablet 1 tablet Once a day, Nitrostat 0.4 MG Tablet Sublingual PLACE 1 TABLET UNDER THE TONGUE EVERY 5 MINUTES AS NEEDED (UP TO 3 DOSES) , BD Insulin Syringe 30G X 1/2" 0.5 ML Miscellaneous USE AS DIRECTED Dx:250.02, Aspirin 81mg  Tablet Dispersible 1 tablet Once a day, Centrum Cardio Tablet 1 tablet twice a day, Fish Oil 1000 MG Capsule 1 tablet 5 per day, Losartan Potassium 100 MG Tablet 1 tablet once a day, Potassium Chloride 10 MEQ Tablet Extended Release 2 caps twice a day, Imdur 30 MG Tablet Extended Release 24 Hour 1 tablet Once a day, Plavix 75 MG Tablet 1 tablet Once a day, Axiron 30 mg/1.5 ml (1 pump) Liquid (alcohol based Apply one pump every morning after shower, Fenofibrate Micronized 67 MG Capsule 1 capsule with a meal Once a day with food, Zetia 10 MG Tablet  1 tablet Once a day, Levofloxacin 500 MG Tablet 1 tablet Once a day, Omeprazole 40 MG Capsule Delayed Release 1 capsule Once a day, Furosemide 80 MG Tablet 1 tablet twice a day, GlipiZIDE 10 MG Tablet 1 tablet twice a day, Novolin 70/30 70-30 % Suspension INJECT 45 TO 55 UNITS 3 TIMES A DAY , Medication List reviewed and reconciled with the patient       Allergies: N.K.D.A.      Objective:     Vitals: Wt 245, Wt change -2  lb, Ht 69.25, BMI 35.92, Pulse sitting 88, BP sitting 134/80.       Examination:  Cardiology, General:  GENERAL APPEARANCE: pleasant, NAD.  HEENT: unremarkable.  CAROTID UPSTROKE: normal, no bruit.  JVD: flat.  HEART SOUNDS: regular, normal S1, S2, no S3 or S4.  MURMUR: absent.  LUNGS: no rales or wheezes.  ABDOMEN: soft, non tender, positive bowel sounds, no masses felt, obese.  EXTREMITIES: Trace edema, 2+ radial pulses.  PERIPHERAL PULSES: 2 plus bilateral.        Assessment:     Assessment:  1. Coronary atherosclerosis of unspecified type of vessel, native or graft - 414.00 (Primary)  2. Obesity, unspecified - 278.00  3. Essential hypertension, benign - 401.1  4. Dyspnea - 786.05  5. Angina Pectoris - 413.9    Plan:     1. Coronary atherosclerosis of unspecified type of vessel, native or graft  LAB: PT (Prothrombin Time) (409811) (Ordered for 10/05/2011) Normal    Prothrombin Time 10.4 9.1-12.0 - SEC    INR 1.0 0.8-1.2 -     Nike Southers 10/06/2011 07:28:20 AM > ok for cath, pre and post hydration. Hold lasix day prior to procedure. Plummer,Wanda 10/06/2011 10:45:16 AM > Pt. notified.    LAB: Basic Metabolic (Ordered for 10/05/2011) Creat 1.39    GLUCOSE 365 70-99 - mg/dL H   BUN 22 9-14 - mg/dL    CREATININE 7.82 9.56-2.13 - mg/dl H   eGFR (NON-AFRICAN AMERICAN) 51 >60 - calc L   eGFR (AFRICAN AMERICAN) 62 >60 - calc    SODIUM 135 136-145 - mmol/L L   POTASSIUM 3.7 3.5-5.5 - mmol/L    CHLORIDE 98 98-107 - mmol/L    C02 31 22-32 - mg/dL    ANION GAP 9.6 0.8-65.7 - mmol/L    CALCIUM 9.5 8.6-10.3 - mg/dL     Sinclaire Artiga 84/69/6295 07:28:20 AM > ok for cath, pre and post hydration. Hold lasix day prior to procedure. Plummer,Wanda 10/06/2011 10:36:58 AM > Pt. notified.    LAB: CBC with Diff (Ordered for 10/05/2011) Hg 13.5    WBC 11.0 4.0-11.0 - K/ul    RBC 4.17 4.20-5.80 - M/uL L   HGB 13.5 13.0-17.0 - g/dL    HCT 28.4 13.2-44.0 - %    MCH 32.4 27.0-33.0 - pg      MPV 8.1 7.5-10.7 - fL    MCV 98.2 80.0-94.0 - fL H   MCHC 33.0 32.0-36.0 - g/dL    RDW 10.2 72.5-36.6 - %    NRBC# 0.00 -    PLT 273 150-400 - K/uL    NEUT % 76.4 43.3-71.9 - % H   NRBC% 0.00 - %    LYMPH% 14.7 16.8-43.5 - % L   MONO % 7.3 4.6-12.4 - %    EOS % 0.8 0.0-7.8 - %    BASO % 0.8 0.0-1.0 - %    NEUT # 8.4 1.9-7.2 - K/uL H   LYMPH# 1.60 1.10-2.70 -  K/uL    MONO # 0.8 0.3-0.8 - K/uL    EOS # 0.1 0.0-0.6 - K/uL    BASO # 0.1 0.0-0.1 - K/uL     Shakara Tweedy 10/06/2011 07:28:20 AM > ok for cath, pre and post hydration. Hold lasix day prior to procedure. Plummer,Wanda 10/06/2011 10:36:46 AM > Pt. notified.    LAB: Basic Metabolic (Ordered for 10/05/2011) Creat 1.39    GLUCOSE 365 70-99 - mg/dL H   BUN 22 9-60 - mg/dL    CREATININE 4.54 0.98-1.19 - mg/dl H   eGFR (NON-AFRICAN AMERICAN) 51 >60 - calc L   eGFR (AFRICAN AMERICAN) 62 >60 - calc    SODIUM 135 136-145 - mmol/L L   POTASSIUM 3.7 3.5-5.5 - mmol/L    CHLORIDE 98 98-107 - mmol/L    C02 31 22-32 - mg/dL    ANION GAP 9.6 1.4-78.2 - mmol/L    CALCIUM 9.5 8.6-10.3 - mg/dL     Reco Shonk 95/62/1308 07:28:20 AM > ok for cath, pre and post hydration. Hold lasix day prior to procedure. Plummer,Wanda 10/06/2011 10:36:58 AM > Pt. notified.    LAB: PT (Prothrombin Time) (657846) (Ordered for 10/05/2011) Normal    Prothrombin Time 10.4 9.1-12.0 - SEC    INR 1.0 0.8-1.2 -     Prabhjot Maddux 10/06/2011 07:28:20 AM > ok for cath, pre and post hydration. Hold lasix day prior to procedure. Plummer,Wanda 10/06/2011 10:45:16 AM > Pt. notified.    LAB: CBC with Diff (Ordered for 10/05/2011) Hg 13.5    WBC 11.0 4.0-11.0 - K/ul    RBC 4.17 4.20-5.80 - M/uL L   HGB 13.5 13.0-17.0 - g/dL    HCT 96.2 95.2-84.1 - %    MCH 32.4 27.0-33.0 - pg    MPV 8.1 7.5-10.7 - fL    MCV 98.2 80.0-94.0 - fL H   MCHC 33.0 32.0-36.0 - g/dL    RDW 32.4 40.1-02.7 - %    NRBC# 0.00 -    PLT 273 150-400 - K/uL    NEUT % 76.4 43.3-71.9 - % H   NRBC%  0.00 - %    LYMPH% 14.7 16.8-43.5 - % L   MONO % 7.3 4.6-12.4 - %    EOS % 0.8 0.0-7.8 - %    BASO % 0.8 0.0-1.0 - %    NEUT # 8.4 1.9-7.2 - K/uL H   LYMPH# 1.60 1.10-2.70 - K/uL    MONO # 0.8 0.3-0.8 - K/uL    EOS # 0.1 0.0-0.6 - K/uL    BASO # 0.1 0.0-0.1 - K/uL     Felecity Lemaster 10/06/2011 07:28:20 AM > ok for cath, pre and post hydration. Hold lasix day prior to procedure. Plummer,Wanda 10/06/2011 10:36:46 AM > Pt. notified.    Since his symptoms are somewhat similar to his prior coronary artery disease symptoms, I will go ahead and pursue cardiac catheterization. It has been 2 years since last evaluation. He states that after the last stent was placed, posterior lateral branch, he felt the best he felt in 5 years. So far, echocardiogram demonstrates normal ejection fraction with mildly enlarged right ventricle, diastolic dysfunction. His BNP was 15, excellent. Creatinine is normal. CBC is unremarkable. Recent lung evaluation reassuring. Risks and benefits of cardiac catheterization have been reviewed including risk of stroke, heart attack, death, bleeding, renal impariment and arterial damage. There was ample oppurtuny to answer questions. Alternatives were discussed. Patient understands and wishes to proceed.       2. Obesity, unspecified  Continue to encourage weight loss.       3. Essential hypertension, benign  Currently well controlled.       4. Dyspnea  May be angina. May be in part from diastolic dysfunction. BMP reassuring. Continue with furosemide 80 mg twice a day.        Immunizations:        Labs:        Procedure Codes: 16109 BLOOD COLLECTION ROUTINE VENIPUNCTURE, 85025 ECL CBC PLATELET DIFF, 80048 ECL BMP       Preventive:         Follow Up: post cath      Provider: Donato Schultz, MD  Patient: Kelvis, Berger DOB: 1946/03/04 Date: 10/05/2011

## 2011-10-12 NOTE — Interval H&P Note (Signed)
History and Physical Interval Note:  10/12/2011 9:45 AM  [name]  has presented today for surgery, with the diagnosis of cp sob [place]  The various methods of treatment have been discussed with the patient and family. After consideration of risks, benefits and other options for treatment, the patient has consented to  Procedure(s) (LRB): LEFT HEART CATHETERIZATION WITH CORONARY ANGIOGRAM (Bilateral) as a surgical intervention .  The patient's history has been reviewed, patient examined, no change in status, stable for surgery.  I have reviewed the patient's chart and labs.  Questions were answered to the patient's satisfaction.     [name]

## 2011-11-10 ENCOUNTER — Encounter: Payer: Self-pay | Admitting: Critical Care Medicine

## 2011-11-10 ENCOUNTER — Ambulatory Visit (INDEPENDENT_AMBULATORY_CARE_PROVIDER_SITE_OTHER): Payer: BC Managed Care – PPO | Admitting: Critical Care Medicine

## 2011-11-10 VITALS — BP 122/60 | HR 71 | Temp 98.2°F | Ht 70.0 in | Wt 245.0 lb

## 2011-11-10 DIAGNOSIS — Z23 Encounter for immunization: Secondary | ICD-10-CM

## 2011-11-10 DIAGNOSIS — D869 Sarcoidosis, unspecified: Secondary | ICD-10-CM

## 2011-11-10 MED ORDER — PREDNISONE 10 MG PO TABS: 10.0000 mg | ORAL_TABLET | Freq: Every day | ORAL | Status: DC

## 2011-11-10 MED ORDER — MOMETASONE FUROATE 50 MCG/ACT NA SUSP: 2.0000 | Freq: Every day | NASAL | Status: DC

## 2011-11-10 NOTE — Assessment & Plan Note (Signed)
Stable sarcoidosis with mediastinal adenopathy steroid dependent Plan Maintain prednisone at current prescribed dosage Return 6 months Flu vaccine

## 2011-11-10 NOTE — Progress Notes (Signed)
Subjective:    Patient ID: [name], male    DOB: [birth year], 66 y.o.   MRN: [id number]  HPI   This is a 66 y.o.   white male with a history of severe pulmonary sarcoidosis with primary mediastinal and hilar lymph node involvement. The patient also has elements of parenchymal involvement. Also severe OSA. The patient had failed Arava therapy. The patient also had adverse side effects Imuran.  Failed Arava and Imuran due to side effects.   11/10/2011 Pt had heart cath but no change on anatomy.  Pt had chest tightness and dyspnea.  Pt has mild cough and nasal congestion noted.  Tightness comes and goes. Pt denies any significant sore throat, nasal congestion or excess secretions, fever, chills, sweats, unintended weight loss, pleurtic or exertional chest pain, orthopnea PND, or leg swelling Pt denies any increase in rescue therapy over baseline, denies waking up needing it or having any early am or nocturnal exacerbations of coughing/wheezing/or dyspnea. Pt also denies any obvious fluctuation in symptoms with  weather or environmental change or other alleviating or aggravating factors                  Past Medical History  Diagnosis Date  . Type 2 diabetes mellitus   . Sarcoid   . Adrenal insufficiency   . Hypertension   . OSA (obstructive sleep apnea)     uses VPAC sleep study 2 years done through [facility]. Dr. Anne Fu arranged study  . Coronary artery disease     has stents  . Heart attack 2010  . Pneumonia     3-4 years ago  . Diabetes mellitus     insulin and pills  . Coronary atherosclerosis of native coronary artery     Proximal LAD, posterior lateral stent widely patent-10/12/11     Family History  Problem Relation Age of Onset  . Asthma Sister   . Anesthesia problems Sister     "Kidney's did not wake up"  . Kidney disease Mother   . Diabetes Mother   . Kidney cancer Mother   . Cancer Mother     kidney  . Heart attack Father      History   Social  History  . Marital Status: Married    Spouse Name: N/A    Number of Children: 3  . Years of Education: N/A   Occupational History  . 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 Smoking status: Never Smoker   . Smokeless tobacco: Never Used  . Alcohol Use: No  . Drug Use: No  . Sexually Active: Not on file   Other Topics Concern  . Not on file   Social History Narrative  . No narrative on file     Allergies  Allergen Reactions  . Hydrocortisone Nausea Only  . Statins Other (See Comments)    Pt says they make him "mean"     Outpatient Prescriptions Prior to Visit  Medication Sig Dispense Refill  . aspirin 81 MG tablet Take 81 mg by mouth daily.        . clopidogrel (PLAVIX) 75 MG tablet Take 75 mg by mouth daily.        Marland Kitchen COENZYME Q-10 PO Take 1 tablet by mouth daily.        Marland Kitchen ezetimibe (ZETIA) 10 MG tablet Take 10 mg by mouth daily.        . fish oil-omega-3  fatty acids 1000 MG capsule Take 1 g by mouth daily.       . furosemide (LASIX) 80 MG tablet Take 80 mg by mouth 2 (two) times daily.        Marland Kitchen glipiZIDE (GLUCOTROL) 10 MG tablet Take 20 mg by mouth 2 (two) times daily.        . insulin aspart protamine-insulin aspart (NOVOLOG 70/30) (70-30) 100 UNIT/ML injection Inject 45-55 Units into the skin 3 (three) times daily with meals. Takes 3 times a day with a meal.  If blood sugar <150, then uses 45 units.  If blood sugar greater than or equal 200 uses 55 units       . isosorbide mononitrate (IMDUR) 30 MG 24 hr tablet Take 30 mg by mouth daily.        Marland Kitchen KLOR-CON M10 10 MEQ tablet TAKE 2 TABLETS BY MOUTH TWICE A DAY  360 tablet  0  . losartan (COZAAR) 100 MG tablet TAKE 1 TABLET BY MOUTH EVERY DAY  90 tablet  1  . Multiple Vitamin (MULTIVITAMIN) capsule Take 1 capsule by mouth daily.        . predniSONE (DELTASONE) 10 MG tablet Take 10 mg by mouth daily.         Review of Systems  Constitutional:   No  weight loss, night sweats,   Fevers, chills, fatigue, lassitude. HEENT:   No headaches,  Difficulty swallowing,  Tooth/dental problems,  Sore throat,                No sneezing, itching, ear ache, nasal congestion, post nasal drip,   CV:  No chest pain,  Orthopnea, PND, swelling in lower extremities, anasarca, dizziness, palpitations  GI  No heartburn, indigestion, abdominal pain, nausea, vomiting, diarrhea, change in bowel habits, loss of appetite  Resp: Notes shortness of breath with exertion or at rest.  No excess mucus, no productive cough,  No non-productive cough,  No coughing up of blood.  Notes  change in color of mucus.  No wheezing.  No chest wall deformity  Skin: no rash or lesions.  GU: no dysuria, change in color of urine, no urgency or frequency.  No flank pain.  MS:  No joint pain or swelling.  No decreased range of motion.  No back pain.  Psych:  No change in mood or affect. No depression or anxiety.  No memory loss.      Objective:   Physical Exam      Filed Vitals:   11/10/11 0914  BP: 122/60  Pulse: 71  Temp: 98.2 F (36.8 C)  TempSrc: Oral  Height: 5\' 10"  (1.778 m)  Weight: 245 lb (111.131 kg)  SpO2: 95%    Gen: Pleasant, cushingoid facies , in no distress,  normal affect  ENT: No lesions,  mouth clear,  oropharynx clear,   +  postnasal drip  Neck: No JVD, no TMG, no carotid bruits  Lungs: No use of accessory muscles, no dullness to percussion, distant BS Cardiovascular: RRR, heart sounds normal, no murmur or gallops, no peripheral edema  Abdomen: soft and NT, no HSM,  BS normal  Musculoskeletal: No deformities, no cyanosis or clubbing  Neuro: alert, non focal  Skin: Warm, no lesions or rashes  PFTs 2010 PFT Conversion 01/04/2009  FVC 2.42  FVC PREDICT 4.54  FVC  % Predicted 53  FEV1 2.03  FEV1 PREDICT 3.16  FEV % Predicted 64  FEV1/FVC 83.9  FEV1/FVC PRE 120  FeF 25-75 2.82  FeF 25-75 % Predicted 2.97  FEF % EXPEC 94   Assessment & Plan:    Sarcoidosis Stable sarcoidosis with mediastinal adenopathy steroid dependent Plan Maintain prednisone at current prescribed dosage Return 6 months Flu vaccine    Updated Medication List Outpatient Encounter Prescriptions as of 11/10/2011  Medication Sig Dispense Refill  . aspirin 81 MG tablet Take 81 mg by mouth daily.        . clopidogrel (PLAVIX) 75 MG tablet Take 75 mg by mouth daily.        Marland Kitchen COENZYME Q-10 PO Take 1 tablet by mouth daily.        Marland Kitchen ezetimibe (ZETIA) 10 MG tablet Take 10 mg by mouth daily.        . fish oil-omega-3 fatty acids 1000 MG capsule Take 1 g by mouth daily.       . furosemide (LASIX) 80 MG tablet Take 80 mg by mouth 2 (two) times daily.        Marland Kitchen glipiZIDE (GLUCOTROL) 10 MG tablet Take 20 mg by mouth 2 (two) times daily.        . insulin aspart protamine-insulin aspart (NOVOLOG 70/30) (70-30) 100 UNIT/ML injection Inject 45-55 Units into the skin 3 (three) times daily with meals. Takes 3 times a day with a meal.  If blood sugar <150, then uses 45 units.  If blood sugar greater than or equal 200 uses 55 units       . isosorbide mononitrate (IMDUR) 30 MG 24 hr tablet Take 30 mg by mouth daily.        Marland Kitchen KLOR-CON M10 10 MEQ tablet TAKE 2 TABLETS BY MOUTH TWICE A DAY  360 tablet  0  . losartan (COZAAR) 100 MG tablet TAKE 1 TABLET BY MOUTH EVERY DAY  90 tablet  1  . Multiple Vitamin (MULTIVITAMIN) capsule Take 1 capsule by mouth daily.        . predniSONE (DELTASONE) 10 MG tablet Take 1 tablet (10 mg total) by mouth daily.  60 tablet  11  . DISCONTD: predniSONE (DELTASONE) 10 MG tablet Take 10 mg by mouth daily.      . mometasone (NASONEX) 50 MCG/ACT nasal spray Place 2 sprays into the nose daily.  6 g  0

## 2011-11-10 NOTE — Patient Instructions (Addendum)
Use nasonex two puff ea nostril daily until sample gone No other medication change, refills on prednisone sent to pharmacy Flu vaccine today Return 6 months

## 2011-12-15 ENCOUNTER — Encounter (INDEPENDENT_AMBULATORY_CARE_PROVIDER_SITE_OTHER): Payer: BC Managed Care – PPO | Admitting: Ophthalmology

## 2012-01-05 ENCOUNTER — Encounter (INDEPENDENT_AMBULATORY_CARE_PROVIDER_SITE_OTHER): Payer: BC Managed Care – PPO | Admitting: Ophthalmology

## 2012-01-05 DIAGNOSIS — I10 Essential (primary) hypertension: Secondary | ICD-10-CM

## 2012-01-05 DIAGNOSIS — E1165 Type 2 diabetes mellitus with hyperglycemia: Secondary | ICD-10-CM

## 2012-01-05 DIAGNOSIS — E11319 Type 2 diabetes mellitus with unspecified diabetic retinopathy without macular edema: Secondary | ICD-10-CM

## 2012-01-05 DIAGNOSIS — H35039 Hypertensive retinopathy, unspecified eye: Secondary | ICD-10-CM

## 2012-01-05 DIAGNOSIS — H43819 Vitreous degeneration, unspecified eye: Secondary | ICD-10-CM

## 2012-01-10 ENCOUNTER — Ambulatory Visit: Payer: BC Managed Care – PPO | Attending: Family Medicine | Admitting: Physical Therapy

## 2012-01-10 DIAGNOSIS — M542 Cervicalgia: Secondary | ICD-10-CM | POA: Insufficient documentation

## 2012-01-10 DIAGNOSIS — IMO0001 Reserved for inherently not codable concepts without codable children: Secondary | ICD-10-CM | POA: Insufficient documentation

## 2012-01-10 DIAGNOSIS — M545 Low back pain, unspecified: Secondary | ICD-10-CM | POA: Insufficient documentation

## 2012-01-11 ENCOUNTER — Ambulatory Visit: Payer: BC Managed Care – PPO | Admitting: Physical Therapy

## 2012-01-17 ENCOUNTER — Ambulatory Visit: Payer: BC Managed Care – PPO | Admitting: Physical Therapy

## 2012-01-19 ENCOUNTER — Ambulatory Visit: Payer: BC Managed Care – PPO | Admitting: Physical Therapy

## 2012-01-24 ENCOUNTER — Ambulatory Visit: Payer: BC Managed Care – PPO | Admitting: Physical Therapy

## 2012-01-25 ENCOUNTER — Other Ambulatory Visit: Payer: Self-pay | Admitting: Critical Care Medicine

## 2012-01-26 ENCOUNTER — Ambulatory Visit: Payer: BC Managed Care – PPO | Admitting: Physical Therapy

## 2012-02-01 ENCOUNTER — Ambulatory Visit: Payer: BC Managed Care – PPO | Admitting: Physical Therapy

## 2012-04-08 ENCOUNTER — Other Ambulatory Visit: Payer: Self-pay | Admitting: Critical Care Medicine

## 2012-05-02 ENCOUNTER — Ambulatory Visit (INDEPENDENT_AMBULATORY_CARE_PROVIDER_SITE_OTHER): Payer: BC Managed Care – PPO | Admitting: Ophthalmology

## 2012-05-17 ENCOUNTER — Encounter: Payer: Self-pay | Admitting: Critical Care Medicine

## 2012-05-17 ENCOUNTER — Ambulatory Visit (INDEPENDENT_AMBULATORY_CARE_PROVIDER_SITE_OTHER): Payer: BC Managed Care – PPO | Admitting: Critical Care Medicine

## 2012-05-17 VITALS — BP 126/64 | HR 83 | Ht 70.0 in | Wt 248.0 lb

## 2012-05-17 DIAGNOSIS — E119 Type 2 diabetes mellitus without complications: Secondary | ICD-10-CM

## 2012-05-17 DIAGNOSIS — D869 Sarcoidosis, unspecified: Secondary | ICD-10-CM

## 2012-05-17 MED ORDER — PREDNISONE 10 MG PO TABS: 10.0000 mg | ORAL_TABLET | Freq: Every day | ORAL | Status: DC

## 2012-05-17 MED ORDER — MOMETASONE FUROATE 50 MCG/ACT NA SUSP: 2.0000 | Freq: Every day | NASAL | Status: DC

## 2012-05-17 NOTE — Progress Notes (Signed)
Subjective:    Patient ID: [name], male    DOB: 1945/07/14, 67 y.o.   MRN: [id number]  HPI   This is a 67 y.o.   white male with a history of severe pulmonary sarcoidosis with primary mediastinal and hilar lymph node involvement. The patient also has elements of parenchymal involvement. Also severe OSA. The patient had failed Arava therapy. The patient also had adverse side effects Imuran.  Failed Arava and Imuran due to side effects.   05/17/2012 Pt notes more leg fatigue.  Now taking pred 10mg  /d .   Pt denies any significant sore throat, nasal congestion or excess secretions, fever, chills, sweats, unintended weight loss, pleurtic or exertional chest pain, orthopnea PND, or leg swelling Pt denies any increase in rescue therapy over baseline, denies waking up needing it or having any early am or nocturnal exacerbations of coughing/wheezing/or dyspnea. Pt also denies any obvious fluctuation in symptoms with  weather or environmental change or other alleviating or aggravating factors    Past Medical History  Diagnosis Date  . Type 2 diabetes mellitus   . Sarcoid   . Adrenal insufficiency   . Hypertension   . OSA (obstructive sleep apnea)     uses VPAC sleep study 2 years done through [facility]. Dr. Anne Fu arranged study  . Coronary artery disease     has stents  . Heart attack 2010  . Pneumonia     3-4 years ago  . Diabetes mellitus     insulin and pills  . Coronary atherosclerosis of native coronary artery     Proximal LAD, posterior lateral stent widely patent-10/12/11     Family History  Problem Relation Age of Onset  . Asthma Sister   . Anesthesia problems Sister     "Kidney's did not wake up"  . Kidney disease Mother   . Diabetes Mother   . Kidney cancer Mother   . Cancer Mother     kidney  . Heart attack Father      History   Social History  . Marital Status: Married    Spouse Name: N/A    Number of Children: 3  . Years of Education: N/A   Occupational  History  . 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 Smoking status: Never Smoker   . Smokeless tobacco: Never Used  . Alcohol Use: No  . Drug Use: No  . Sexually Active: Not on file   Other Topics Concern  . Not on file   Social History Narrative  . No narrative on file     Allergies  Allergen Reactions  . Hydrocortisone Nausea Only  . Statins Other (See Comments)    Pt says they make him "mean"     Outpatient Prescriptions Prior to Visit  Medication Sig Dispense Refill  . aspirin 81 MG tablet Take 81 mg by mouth daily.        . clopidogrel (PLAVIX) 75 MG tablet Take 75 mg by mouth daily.        Marland Kitchen COENZYME Q-10 PO Take 1 tablet by mouth daily.        Marland Kitchen ezetimibe (ZETIA) 10 MG tablet Take 10 mg by mouth daily.        . fish oil-omega-3 fatty acids 1000 MG capsule Take 1 g by mouth daily.       . furosemide (LASIX) 80 MG tablet Take 80 mg by mouth daily.       Marland Kitchen  glipiZIDE (GLUCOTROL) 10 MG tablet Take 20 mg by mouth 2 (two) times daily.        . insulin aspart protamine-insulin aspart (NOVOLOG 70/30) (70-30) 100 UNIT/ML injection Inject 45-55 Units into the skin 3 (three) times daily with meals. Takes 3 times a day with a meal.  If blood sugar <150, then uses 45 units.  If blood sugar greater than or equal 200 uses 55 units       . isosorbide mononitrate (IMDUR) 30 MG 24 hr tablet Take 30 mg by mouth daily.        Marland Kitchen KLOR-CON M10 10 MEQ tablet TAKE 2 TABLETS BY MOUTH TWICE A DAY  360 tablet  0  . losartan (COZAAR) 100 MG tablet TAKE 1 TABLET BY MOUTH EVERY DAY  90 tablet  1  . Multiple Vitamin (MULTIVITAMIN) capsule Take 1 capsule by mouth daily.        . mometasone (NASONEX) 50 MCG/ACT nasal spray Place 2 sprays into the nose daily.  6 g  0  . predniSONE (DELTASONE) 10 MG tablet Take 1 tablet (10 mg total) by mouth daily.  60 tablet  11   No facility-administered medications prior to visit.     Review of  Systems  Constitutional:   No  weight loss, night sweats,  Fevers, chills, fatigue, lassitude. HEENT:   No headaches,  Difficulty swallowing,  Tooth/dental problems,  Sore throat,                No sneezing, itching, ear ache, nasal congestion, post nasal drip,   CV:  No chest pain,  Orthopnea, PND, swelling in lower extremities, anasarca, dizziness, palpitations  GI  No heartburn, indigestion, abdominal pain, nausea, vomiting, diarrhea, change in bowel habits, loss of appetite  Resp: Notes shortness of breath with exertion or at rest.  No excess mucus, no productive cough,  No non-productive cough,  No coughing up of blood.  Notes  change in color of mucus.  No wheezing.  No chest wall deformity  Skin: no rash or lesions.  GU: no dysuria, change in color of urine, no urgency or frequency.  No flank pain.  MS:  No joint pain or swelling.  No decreased range of motion.  No back pain.  Psych:  No change in mood or affect. No depression or anxiety.  No memory loss.      Objective:   Physical Exam      Filed Vitals:   05/17/12 1152  BP: 126/64  Pulse: 83  Height: 5\' 10"  (1.778 m)  Weight: 248 lb (112.492 kg)  SpO2: 96%    Gen: Pleasant, cushingoid facies , in no distress,  normal affect  ENT: No lesions,  mouth clear,  oropharynx clear,   +  postnasal drip  Neck: No JVD, no TMG, no carotid bruits  Lungs: No use of accessory muscles, no dullness to percussion, distant BS Cardiovascular: RRR, heart sounds normal, no murmur or gallops, no peripheral edema  Abdomen: soft and NT, no HSM,  BS normal  Musculoskeletal: No deformities, no cyanosis or clubbing  Neuro: alert, non focal  Skin: Warm, no lesions or rashes  PFTs 2010 PFT Conversion 01/04/2009  FVC 2.42  FVC PREDICT 4.54  FVC  % Predicted 53  FEV1 2.03  FEV1 PREDICT 3.16  FEV % Predicted 64  FEV1/FVC 83.9  FEV1/FVC PRE 120  FeF 25-75 2.82  FeF 25-75 % Predicted 2.97  FEF % EXPEC 94   Assessment & Plan:  Sarcoidosis Pulmonary sarcoidosis with primary involvement and mediastinal lymph nodes stable at this time Steroid dependent Plan Maintain prednisone 10 mg daily    Updated Medication List Outpatient Encounter Prescriptions as of 05/17/2012  Medication Sig Dispense Refill  . aspirin 81 MG tablet Take 81 mg by mouth daily.        . clopidogrel (PLAVIX) 75 MG tablet Take 75 mg by mouth daily.        Marland Kitchen COENZYME Q-10 PO Take 1 tablet by mouth daily.        Marland Kitchen ezetimibe (ZETIA) 10 MG tablet Take 10 mg by mouth daily.        . fish oil-omega-3 fatty acids 1000 MG capsule Take 1 g by mouth daily.       . furosemide (LASIX) 80 MG tablet Take 80 mg by mouth daily.       Marland Kitchen glipiZIDE (GLUCOTROL) 10 MG tablet Take 20 mg by mouth 2 (two) times daily.        . insulin aspart protamine-insulin aspart (NOVOLOG 70/30) (70-30) 100 UNIT/ML injection Inject 45-55 Units into the skin 3 (three) times daily with meals. Takes 3 times a day with a meal.  If blood sugar <150, then uses 45 units.  If blood sugar greater than or equal 200 uses 55 units       . isosorbide mononitrate (IMDUR) 30 MG 24 hr tablet Take 30 mg by mouth daily.        Marland Kitchen KLOR-CON M10 10 MEQ tablet TAKE 2 TABLETS BY MOUTH TWICE A DAY  360 tablet  0  . losartan (COZAAR) 100 MG tablet TAKE 1 TABLET BY MOUTH EVERY DAY  90 tablet  1  . mometasone (NASONEX) 50 MCG/ACT nasal spray Place 2 sprays into the nose daily.  51 g  4  . Multiple Vitamin (MULTIVITAMIN) capsule Take 1 capsule by mouth daily.        . predniSONE (DELTASONE) 10 MG tablet Take 1 tablet (10 mg total) by mouth daily.  90 tablet  11  . [DISCONTINUED] mometasone (NASONEX) 50 MCG/ACT nasal spray Place 2 sprays into the nose daily.  6 g  0  . [DISCONTINUED] predniSONE (DELTASONE) 10 MG tablet Take 1 tablet (10 mg total) by mouth daily.  60 tablet  11   No facility-administered encounter medications on file as of 05/17/2012.

## 2012-05-17 NOTE — Patient Instructions (Addendum)
No changes in medications  Return 6 months

## 2012-05-18 NOTE — Assessment & Plan Note (Signed)
Pulmonary sarcoidosis with primary involvement and mediastinal lymph nodes stable at this time Steroid dependent Plan Maintain prednisone 10 mg daily

## 2012-06-06 ENCOUNTER — Ambulatory Visit (INDEPENDENT_AMBULATORY_CARE_PROVIDER_SITE_OTHER): Payer: BC Managed Care – PPO | Admitting: Ophthalmology

## 2012-06-10 ENCOUNTER — Other Ambulatory Visit: Payer: Self-pay | Admitting: Critical Care Medicine

## 2012-06-11 NOTE — Telephone Encounter (Signed)
Prednisone 10 mg rx sent on 05/17/12 # 90 x 11. Spoke with Gunnar Fusi with [facility].  She verified rx from March 2014 was received. Nothing further needed at this time.

## 2012-06-14 ENCOUNTER — Ambulatory Visit (INDEPENDENT_AMBULATORY_CARE_PROVIDER_SITE_OTHER): Payer: BC Managed Care – PPO | Admitting: Ophthalmology

## 2012-06-14 DIAGNOSIS — H35039 Hypertensive retinopathy, unspecified eye: Secondary | ICD-10-CM

## 2012-06-14 DIAGNOSIS — H43819 Vitreous degeneration, unspecified eye: Secondary | ICD-10-CM

## 2012-06-14 DIAGNOSIS — E1139 Type 2 diabetes mellitus with other diabetic ophthalmic complication: Secondary | ICD-10-CM

## 2012-06-14 DIAGNOSIS — E11319 Type 2 diabetes mellitus with unspecified diabetic retinopathy without macular edema: Secondary | ICD-10-CM

## 2012-06-14 DIAGNOSIS — I10 Essential (primary) hypertension: Secondary | ICD-10-CM

## 2012-07-12 ENCOUNTER — Other Ambulatory Visit: Payer: Self-pay | Admitting: Critical Care Medicine

## 2012-07-17 ENCOUNTER — Other Ambulatory Visit: Payer: Self-pay | Admitting: Critical Care Medicine

## 2012-10-16 ENCOUNTER — Other Ambulatory Visit: Payer: Self-pay | Admitting: Critical Care Medicine

## 2012-11-22 ENCOUNTER — Encounter: Payer: Self-pay | Admitting: Critical Care Medicine

## 2012-11-22 ENCOUNTER — Ambulatory Visit (INDEPENDENT_AMBULATORY_CARE_PROVIDER_SITE_OTHER): Payer: BC Managed Care – PPO | Admitting: Critical Care Medicine

## 2012-11-22 VITALS — BP 130/70 | HR 107 | Temp 98.2°F | Ht 70.5 in | Wt 257.0 lb

## 2012-11-22 DIAGNOSIS — G4733 Obstructive sleep apnea (adult) (pediatric): Secondary | ICD-10-CM

## 2012-11-22 DIAGNOSIS — D869 Sarcoidosis, unspecified: Secondary | ICD-10-CM

## 2012-11-22 DIAGNOSIS — Z23 Encounter for immunization: Secondary | ICD-10-CM

## 2012-11-22 NOTE — Assessment & Plan Note (Signed)
Sarcoidosis stable at this time Steroid dependent Plan Maintain low-dose prednisone

## 2012-11-22 NOTE — Patient Instructions (Addendum)
No change in medications. Return in    6 months Flu vaccine today A new cpap machine will be obtained

## 2012-11-22 NOTE — Progress Notes (Signed)
Subjective:    Patient ID: [name], male    DOB: 09/11/1945, 67 y.o.   MRN: [id number]  HPI   This is a 67 y.o.   white male with a history of severe pulmonary sarcoidosis with primary mediastinal and hilar lymph node involvement. The patient also has elements of parenchymal involvement. Also severe OSA. The patient had failed Arava therapy. The patient also had adverse side effects Imuran.  Failed Arava and Imuran due to side effects.   11/22/2012 Chief Complaint  Patient presents with  . 6 month follow up    has good days and bad days.  Notices DOE, wheezing at times, and chest tightness with SOB.  No cough.  No new complaints at this time. The patient has good and bad days. There is shortness of breath with exertion. There is minimal wheezing. There is no cough. The patient maintains low-dose prednisone. The patient states his current CPAP device needs to be replaced   Past Medical History  Diagnosis Date  . Type 2 diabetes mellitus   . Sarcoid   . Adrenal insufficiency   . Hypertension   . OSA (obstructive sleep apnea)     uses VPAC sleep study 2 years done through [place]. Dr. Anne Fu arranged study  . Coronary artery disease     has stents  . Heart attack 2010  . Pneumonia     3-4 years ago  . Diabetes mellitus     insulin and pills  . Coronary atherosclerosis of native coronary artery     Proximal LAD, posterior lateral stent widely patent-10/12/11     Family History  Problem Relation Age of Onset  . Asthma Sister   . Anesthesia problems Sister     "Kidney's did not wake up"  . Kidney disease Mother   . Diabetes Mother   . Kidney cancer Mother   . Cancer Mother     kidney  . Heart attack Father      History   Social History  . Marital Status: Married    Spouse Name: N/A    Number of Children: 3  . Years of Education: N/A   Occupational History  . 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 Smoking status:  Never Smoker   . Smokeless tobacco: Never Used  . Alcohol Use: No  . Drug Use: No  . Sexual Activity: Not on file   Other Topics Concern  . Not on file   Social History Narrative  . No narrative on file     Allergies  Allergen Reactions  . Hydrocortisone Nausea Only  . Statins Other (See Comments)    Pt says they make him "mean"     Outpatient Prescriptions Prior to Visit  Medication Sig Dispense Refill  . aspirin 81 MG tablet Take 81 mg by mouth daily.        . clopidogrel (PLAVIX) 75 MG tablet Take 75 mg by mouth daily.        Marland Kitchen COENZYME Q-10 PO Take 1 tablet by mouth daily.        Marland Kitchen ezetimibe (ZETIA) 10 MG tablet Take 10 mg by mouth daily.        . fish oil-omega-3 fatty acids 1000 MG capsule Take 1 g by mouth daily.       . furosemide (LASIX) 80 MG tablet Take 80 mg by mouth daily.       Marland Kitchen glipiZIDE (  GLUCOTROL) 10 MG tablet Take 20 mg by mouth 2 (two) times daily.        . insulin aspart protamine-insulin aspart (NOVOLOG 70/30) (70-30) 100 UNIT/ML injection Inject 45-55 Units into the skin 3 (three) times daily with meals. Takes 3 times a day with a meal.  If blood sugar <150, then uses 45 units.  If blood sugar greater than or equal 200 uses 55 units       . isosorbide mononitrate (IMDUR) 30 MG 24 hr tablet Take 30 mg by mouth daily.        Marland Kitchen KLOR-CON M10 10 MEQ tablet TAKE 2 TABLETS BY MOUTH TWICE A DAY  360 tablet  0  . losartan (COZAAR) 100 MG tablet TAKE 1 TABLET BY MOUTH EVERY DAY  90 tablet  1  . Multiple Vitamin (MULTIVITAMIN) capsule Take 1 capsule by mouth daily.        . predniSONE (DELTASONE) 10 MG tablet TAKE 1 TABLET BY MOUTH EVERY DAY AS DIRECTED  90 tablet  3  . mometasone (NASONEX) 50 MCG/ACT nasal spray Place 2 sprays into the nose daily.  51 g  4  . predniSONE (DELTASONE) 10 MG tablet Take 1 tablet (10 mg total) by mouth daily.  90 tablet  11   No facility-administered medications prior to visit.     Review of Systems  Constitutional:   No  weight  loss, night sweats,  Fevers, chills, fatigue, lassitude. HEENT:   No headaches,  Difficulty swallowing,  Tooth/dental problems,  Sore throat,                No sneezing, itching, ear ache, nasal congestion, post nasal drip,   CV:  No chest pain,  Orthopnea, PND, swelling in lower extremities, anasarca, dizziness, palpitations  GI  No heartburn, indigestion, abdominal pain, nausea, vomiting, diarrhea, change in bowel habits, loss of appetite  Resp: Notes shortness of breath with exertion or at rest.  No excess mucus, no productive cough,  No non-productive cough,  No coughing up of blood.  Notes  change in color of mucus.  No wheezing.  No chest wall deformity  Skin: no rash or lesions.  GU: no dysuria, change in color of urine, no urgency or frequency.  No flank pain.  MS:  No joint pain or swelling.  No decreased range of motion.  No back pain.  Psych:  No change in mood or affect. No depression or anxiety.  No memory loss.      Objective:   Physical Exam      Filed Vitals:   11/22/12 0921  BP: 130/70  Pulse: 107  Temp: 98.2 F (36.8 C)  TempSrc: Oral  Height: 5' 10.5" (1.791 m)  Weight: 257 lb (116.574 kg)  SpO2: 95%    Gen: Pleasant, cushingoid facies , in no distress,  normal affect  ENT: No lesions,  mouth clear,  oropharynx clear,   +  postnasal drip  Neck: No JVD, no TMG, no carotid bruits  Lungs: No use of accessory muscles, no dullness to percussion, distant BS  Cardiovascular: RRR, heart sounds normal, no murmur or gallops, no peripheral edema  Abdomen: soft and NT, no HSM,  BS normal  Musculoskeletal: No deformities, no cyanosis or clubbing  Neuro: alert, non focal  Skin: Warm, no lesions or rashes    Assessment & Plan:   Sarcoidosis Sarcoidosis stable at this time Steroid dependent Plan Maintain low-dose prednisone    Updated Medication List Outpatient Encounter Prescriptions  as of 11/22/2012  Medication Sig Dispense Refill  . aspirin  81 MG tablet Take 81 mg by mouth daily.        . clopidogrel (PLAVIX) 75 MG tablet Take 75 mg by mouth daily.        Marland Kitchen COENZYME Q-10 PO Take 1 tablet by mouth daily.        Marland Kitchen ezetimibe (ZETIA) 10 MG tablet Take 10 mg by mouth daily.        . fish oil-omega-3 fatty acids 1000 MG capsule Take 1 g by mouth daily.       . furosemide (LASIX) 80 MG tablet Take 80 mg by mouth daily.       Marland Kitchen glipiZIDE (GLUCOTROL) 10 MG tablet Take 20 mg by mouth 2 (two) times daily.        . insulin aspart protamine-insulin aspart (NOVOLOG 70/30) (70-30) 100 UNIT/ML injection Inject 45-55 Units into the skin 3 (three) times daily with meals. Takes 3 times a day with a meal.  If blood sugar <150, then uses 45 units.  If blood sugar greater than or equal 200 uses 55 units       . isosorbide mononitrate (IMDUR) 30 MG 24 hr tablet Take 30 mg by mouth daily.        Marland Kitchen KLOR-CON M10 10 MEQ tablet TAKE 2 TABLETS BY MOUTH TWICE A DAY  360 tablet  0  . losartan (COZAAR) 100 MG tablet TAKE 1 TABLET BY MOUTH EVERY DAY  90 tablet  1  . mometasone (NASONEX) 50 MCG/ACT nasal spray Place 2 sprays into the nose daily as needed.      . Multiple Vitamin (MULTIVITAMIN) capsule Take 1 capsule by mouth daily.        . predniSONE (DELTASONE) 10 MG tablet TAKE 1 TABLET BY MOUTH EVERY DAY AS DIRECTED  90 tablet  3  . [DISCONTINUED] mometasone (NASONEX) 50 MCG/ACT nasal spray Place 2 sprays into the nose daily.  51 g  4  . [DISCONTINUED] predniSONE (DELTASONE) 10 MG tablet Take 1 tablet (10 mg total) by mouth daily.  90 tablet  11   No facility-administered encounter medications on file as of 11/22/2012.

## 2012-11-23 ENCOUNTER — Telehealth: Payer: Self-pay | Admitting: *Deleted

## 2012-11-23 DIAGNOSIS — G4730 Sleep apnea, unspecified: Secondary | ICD-10-CM

## 2012-11-23 NOTE — Telephone Encounter (Signed)
Message copied by Valentino Hue on Fri Nov 23, 2012  2:36 PM ------      Message from: Shan Levans E      Created: Fri Nov 23, 2012  2:24 PM      Regarding: RE: new cpap       He has had an autoset Cpap for years that is 5-20            i suggest getting an autoset with two week download then we can determine pressure from their      ----- Message -----         From: Raford Pitcher, RN         Sent: 11/23/2012  11:34 AM           To: Storm Frisk, MD      Subject: RE: new cpap                                             Dr. Delford Field, Can you please provide further information on below request for [facility]?              Thank you,      Emerita Berkemeier            ----- Message -----         From: Henderson Newcomer         Sent: 11/23/2012  10:32 AM           To: Modesto Charon, CMA, Agapito Hanway Jyl Heinz, RN      Subject: RE: new cpap                                             Hey Alida and Sherald Balbuena.       According to our records, this patient has an ASV unit that he received from [facility].  We need a revised RX that is for that particular unit and must include pressure settings.  There is no indication in our notes as to what his pressure is as we have just provided supplies for him in the past.      Thanks for your help!      ----- Message -----         From: Modesto Charon, CMA         Sent: 11/22/2012  10:41 AM           To: Melissa Stenson      Subject: new cpap                                                 Order for new cpap per Dr Delford Field .Kandice Hams                         ------

## 2012-11-23 NOTE — Telephone Encounter (Signed)
I have placed order for [facility] to do a 2 wk auto download.   Called, spoke with pt to inform him of this.  Pt states he has an "old fashion" machine called a vcpap.  Pt states his machine can only do an auto setting and isn't equipped to do a fixed pressure.  Pt states he can take his machine into [facility] if needed to get the information needed. Called, spoke with Melissa with [facility].  She will check further on this to see exactly what we need to do here and will call back.

## 2012-11-29 NOTE — Telephone Encounter (Signed)
Order placed. Sent staff msg to [facility] to inform her of this.

## 2012-11-29 NOTE — Telephone Encounter (Signed)
At this point lets take care of the patient and place the order

## 2012-11-29 NOTE — Telephone Encounter (Signed)
Received the following staff msg from Melissa:  Henderson Newcomer More Detail >>     Henderson Newcomer     Sent: Wed November 28, 2012 12:45 PM    To: Raford Pitcher, RN    Cc: Modesto Charon, CMA    Message    Jac Canavan.          We were able to get this pt's PAP information that Dr. Delford Field needs to write his order with.     Pt's pressure on his Vpap [employer] is  EEP = 9cmh20, auto min. pressure support of 6cmh20 and max pressure support is 15cmh20.         Can you please ask him to put in a new order for the replacement machine including current settings?  Just let me know when it's ready and I'll pull everything.         Thanks!!   --------  Dr. Delford Field, this is the pt we discussed about Dr. Mayford Knife was actually the ordering physican for the sleep study.  Therefore, would you this this order to be deferred to her or no?  Please advise. Thank you.

## 2012-11-30 ENCOUNTER — Telehealth: Payer: Self-pay | Admitting: Critical Care Medicine

## 2012-11-30 NOTE — Telephone Encounter (Signed)
Called, spoke with Melissa.  She is calling as FYI update only regarding pt's new machine.  States pt's current machine was provided to him by [facility] 4 years ago.  [employer] is no longer repairing the machine he currently has, so pt cannot get a replacement machine and is not eligible for a new machine given it hasn't been 5 yrs yet.  Melissa states they are waiting from a letter from [facility] stating they are no longer repairing the machine this pt has.  Once they receive this letter, they will call [employer] to get a Prior Auth to assist with pt getting a new machine.  This may delay the process approx 1 wk.  As this is FYI only, will sign off and route to PW.

## 2013-01-10 ENCOUNTER — Other Ambulatory Visit: Payer: Self-pay | Admitting: Cardiology

## 2013-01-11 ENCOUNTER — Other Ambulatory Visit: Payer: Self-pay | Admitting: Cardiology

## 2013-01-11 MED ORDER — ISOSORBIDE MONONITRATE ER 60 MG PO TB24: 60.0000 mg | ORAL_TABLET | Freq: Every day | ORAL | Status: DC

## 2013-01-21 ENCOUNTER — Other Ambulatory Visit: Payer: Self-pay | Admitting: Critical Care Medicine

## 2013-01-22 NOTE — Telephone Encounter (Signed)
I spoke with Dr. Delford Field regarding refilling pt's K.   Per PW:  This should really be managed by pt's PCP. I have deferred rx asking pharm to please send request to pt's PCP.

## 2013-01-23 ENCOUNTER — Other Ambulatory Visit: Payer: Self-pay | Admitting: Cardiology

## 2013-01-29 ENCOUNTER — Telehealth: Payer: Self-pay | Admitting: Critical Care Medicine

## 2013-01-29 MED ORDER — POTASSIUM CHLORIDE CRYS ER 10 MEQ PO TBCR: EXTENDED_RELEASE_TABLET | ORAL | Status: DC

## 2013-01-29 NOTE — Telephone Encounter (Signed)
Spoke with pt, he is aware that klor-con refill has been sent to [facility].  No further action needed. Caulfield,Ashley L.

## 2013-02-20 ENCOUNTER — Encounter: Payer: Self-pay | Admitting: Cardiology

## 2013-02-21 ENCOUNTER — Ambulatory Visit: Payer: BC Managed Care – PPO | Admitting: Cardiology

## 2013-03-04 ENCOUNTER — Ambulatory Visit (INDEPENDENT_AMBULATORY_CARE_PROVIDER_SITE_OTHER): Payer: BC Managed Care – PPO | Admitting: Ophthalmology

## 2013-07-11 ENCOUNTER — Ambulatory Visit: Payer: BC Managed Care – PPO | Admitting: Critical Care Medicine

## 2013-08-01 ENCOUNTER — Encounter: Payer: Self-pay | Admitting: Critical Care Medicine

## 2013-08-01 ENCOUNTER — Ambulatory Visit (INDEPENDENT_AMBULATORY_CARE_PROVIDER_SITE_OTHER): Payer: BC Managed Care – PPO | Admitting: Critical Care Medicine

## 2013-08-01 VITALS — BP 124/70 | HR 65 | Temp 98.2°F | Ht 70.5 in | Wt 251.0 lb

## 2013-08-01 DIAGNOSIS — G4730 Sleep apnea, unspecified: Secondary | ICD-10-CM

## 2013-08-01 DIAGNOSIS — D869 Sarcoidosis, unspecified: Secondary | ICD-10-CM

## 2013-08-01 MED ORDER — PREDNISONE 10 MG PO TABS: ORAL_TABLET | ORAL | Status: DC

## 2013-08-01 MED ORDER — POTASSIUM CHLORIDE CRYS ER 10 MEQ PO TBCR: EXTENDED_RELEASE_TABLET | ORAL | Status: DC

## 2013-08-01 NOTE — Assessment & Plan Note (Signed)
Sarcoid with mediastinal involvement Steroid dependent d/t adrenal insufficiency Plan Cont pred 10mg  /d

## 2013-08-01 NOTE — Patient Instructions (Signed)
No change in medications. Return in        6 months        

## 2013-08-01 NOTE — Progress Notes (Signed)
Subjective:    Patient ID: [name], male    DOB: 11-17-1945, 68 y.o.   MRN: [id number]  HPI   This is a 68 y.o.   white male with a history of severe pulmonary sarcoidosis with primary mediastinal and hilar lymph node involvement. The patient also has elements of parenchymal involvement. Also severe OSA. The patient had failed [place] therapy. The patient also had adverse side effects Imuran.  Failed Arava and Imuran due to side effects.   08/01/2013 Chief Complaint  Patient presents with  . 8 month follow up    SOB is at baseline.  No wheezing, chest tightness/pain, or cough.  Doing well . No new issues. Dealing with spouse health issues. Pt denies any significant sore throat, nasal congestion or excess secretions, fever, chills, sweats, unintended weight loss, pleurtic or exertional chest pain, orthopnea PND, or leg swelling Pt denies any increase in rescue therapy over baseline, denies waking up needing it or having any early am or nocturnal exacerbations of coughing/wheezing/or dyspnea. Pt also denies any obvious fluctuation in symptoms with  weather or environmental change or other alleviating or aggravating factors     Review of Systems  Constitutional:   No  weight loss, night sweats,  Fevers, chills, fatigue, lassitude. HEENT:   No headaches,  Difficulty swallowing,  Tooth/dental problems,  Sore throat,                No sneezing, itching, ear ache, nasal congestion, post nasal drip,   CV:  No chest pain,  Orthopnea, PND, swelling in lower extremities, anasarca, dizziness, palpitations  GI  No heartburn, indigestion, abdominal pain, nausea, vomiting, diarrhea, change in bowel habits, loss of appetite  Resp: Notes shortness of breath with exertion or at rest.  No excess mucus, no productive cough,  No non-productive cough,  No coughing up of blood.  Notes  change in color of mucus.  No wheezing.  No chest wall deformity  Skin: no rash or lesions.  GU: no dysuria, change in  color of urine, no urgency or frequency.  No flank pain.  MS:  No joint pain or swelling.  No decreased range of motion.  No back pain.  Psych:  No change in mood or affect. No depression or anxiety.  No memory loss.      Objective:   Physical Exam     Filed Vitals:   08/01/13 0925  BP: 124/70  Pulse: 65  Temp: 98.2 F (36.8 C)  TempSrc: Oral  Height: 5' 10.5" (1.791 m)  Weight: 251 lb (113.853 kg)  SpO2: 93%    Gen: Pleasant, cushingoid facies , in no distress,  normal affect  ENT: No lesions,  mouth clear,  oropharynx clear,   No  postnasal drip  Neck: No JVD, no TMG, no carotid bruits  Lungs: No use of accessory muscles, no dullness to percussion, distant BS  Cardiovascular: RRR, heart sounds normal, no murmur or gallops, no peripheral edema  Abdomen: soft and NT, no HSM,  BS normal  Musculoskeletal: No deformities, no cyanosis or clubbing  Neuro: alert, non focal  Skin: Warm, no lesions or rashes    Assessment & Plan:   SLEEP APNEA OSA stable on new cpap Plan Cont cpap  Sarcoidosis Sarcoid with mediastinal involvement Steroid dependent d/t adrenal insufficiency Plan Cont pred 10mg  /d    Updated Medication List Outpatient Encounter Prescriptions as of 08/01/2013  Medication Sig  . aspirin 81 MG tablet Take 81 mg by mouth  daily.    . clopidogrel (PLAVIX) 75 MG tablet TAKE 1 TABLET EVERY DAY  . COENZYME Q-10 PO Take 1 tablet by mouth daily.    Marland Kitchen ezetimibe (ZETIA) 10 MG tablet Take 10 mg by mouth daily.    . fish oil-omega-3 fatty acids 1000 MG capsule Take 1 g by mouth daily.   . furosemide (LASIX) 80 MG tablet Take 80 mg by mouth daily.   Marland Kitchen glipiZIDE (GLUCOTROL) 10 MG tablet Take 20 mg by mouth 2 (two) times daily.    . insulin aspart protamine-insulin aspart (NOVOLOG 70/30) (70-30) 100 UNIT/ML injection Inject 45-55 Units into the skin 3 (three) times daily with meals. Takes 3 times a day with a meal.  If blood sugar <150, then uses 45 units.  If  blood sugar greater than or equal 200 uses 55 units   . isosorbide mononitrate (IMDUR) 60 MG 24 hr tablet Take 1 tablet (60 mg total) by mouth daily.  Marland Kitchen losartan (COZAAR) 100 MG tablet TAKE 1 TABLET BY MOUTH EVERY DAY  . mometasone (NASONEX) 50 MCG/ACT nasal spray Place 2 sprays into the nose daily as needed.  . Multiple Vitamin (MULTIVITAMIN) capsule Take 1 capsule by mouth daily.    . potassium chloride (KLOR-CON M10) 10 MEQ tablet 2 tablets daily  . predniSONE (DELTASONE) 10 MG tablet TAKE 1 TABLET BY MOUTH EVERY DAY AS DIRECTED  . [DISCONTINUED] potassium chloride (KLOR-CON M10) 10 MEQ tablet 2 tablets daily  . [DISCONTINUED] predniSONE (DELTASONE) 10 MG tablet TAKE 1 TABLET BY MOUTH EVERY DAY AS DIRECTED

## 2013-08-01 NOTE — Assessment & Plan Note (Signed)
OSA stable on new cpap Plan Cont cpap

## 2014-01-14 ENCOUNTER — Encounter: Payer: Self-pay | Admitting: Cardiology

## 2014-01-14 ENCOUNTER — Ambulatory Visit (INDEPENDENT_AMBULATORY_CARE_PROVIDER_SITE_OTHER): Payer: BC Managed Care – PPO | Admitting: Cardiology

## 2014-01-14 VITALS — BP 110/72 | HR 103 | Ht 70.0 in | Wt 253.0 lb

## 2014-01-14 DIAGNOSIS — E1165 Type 2 diabetes mellitus with hyperglycemia: Secondary | ICD-10-CM | POA: Insufficient documentation

## 2014-01-14 DIAGNOSIS — D860 Sarcoidosis of lung: Secondary | ICD-10-CM | POA: Insufficient documentation

## 2014-01-14 DIAGNOSIS — E669 Obesity, unspecified: Secondary | ICD-10-CM

## 2014-01-14 DIAGNOSIS — I208 Other forms of angina pectoris: Secondary | ICD-10-CM

## 2014-01-14 DIAGNOSIS — I5032 Chronic diastolic (congestive) heart failure: Secondary | ICD-10-CM | POA: Insufficient documentation

## 2014-01-14 DIAGNOSIS — IMO0002 Reserved for concepts with insufficient information to code with codable children: Secondary | ICD-10-CM | POA: Insufficient documentation

## 2014-01-14 DIAGNOSIS — E785 Hyperlipidemia, unspecified: Secondary | ICD-10-CM | POA: Insufficient documentation

## 2014-01-14 MED ORDER — BISOPROLOL FUMARATE 5 MG PO TABS: 5.0000 mg | ORAL_TABLET | Freq: Every day | ORAL | Status: DC

## 2014-01-14 MED ORDER — ISOSORBIDE MONONITRATE ER 60 MG PO TB24: 60.0000 mg | ORAL_TABLET | Freq: Every day | ORAL | Status: DC

## 2014-01-14 NOTE — Patient Instructions (Addendum)
Please start Bisoprolol 5 mg a day. Continue all other medications as listed.  Follow up in 6 months with Dr. Marlou Porch.  You will receive a letter in the mail 2 months before you are due.  Please call us when you receive this letter to schedule your follow up appointment.  Thank you for choosing [facility] [place]!!

## 2014-01-14 NOTE — Progress Notes (Signed)
[place]. [address], [address], [place]  [address] Phone: [phone] Fax:  [phone]  Date:  01/14/2014   ID:  Craig, [name] Apr 14, 1945, MRN [id number]  PCP:  Craig Frees, MD   History of Present Illness: [name] is a 68 y.o. male with coronary artery disease, drug eluding stent to LAD and posterior lateral branch 10/2009, cholecystectomy November 2012, history of pulmonary sarcoid. Cardiac catheterization 10/12/11 that revealed patent LAD, small vessels with normal left ventricular function of 55%, increased left ventricular end-diastolic pressure. Short of breath, decreased endurance. Difficult diabetes. Dr. Joya [name] on chronic prednisone therapy which seemed to help with lower extremity discomfort.  Wife surgery stroke after neck surgery. Daughter killed in tractor accident.He is going to try to retire in one year. He is having to drive home to give his wife lunch. She now has disability.  Having chest discomfort, shortness of breath with mild to moderate activity. He has been out of isosorbide. In the past, not able to tolerate beta blockers. We will try again.   Wt Readings from Last 3 Encounters:  01/14/14 253 lb (114.76 kg)  08/01/13 251 lb (113.853 kg)  11/22/12 257 lb (116.574 kg)     Past Medical History  Diagnosis Date  . Type 2 diabetes mellitus   . Sarcoid   . Adrenal insufficiency   . Hypertension   . OSA (obstructive sleep apnea)     uses VPAC sleep study 2 years done through [facility]. Dr. Marlou [name] arranged study  . Coronary artery disease     has stents  . Heart attack 2010  . Pneumonia     3-4 years ago  . Diabetes mellitus     insulin and pills  . Coronary atherosclerosis of native coronary artery     Proximal LAD, posterior lateral stent widely patent-10/12/11    Past Surgical History  Procedure Laterality Date  . Coronary stent placement  2009    in LAD and side branch PTCA  . Cataract extraction  2011    bilat  . Intercostal nerve  block  2011    x2. lumbar spine  . Appendectomy    . Coronary angioplasty      most recent 11/2009  . Cholecystectomy  01/25/2011    Procedure: LAPAROSCOPIC CHOLECYSTECTOMY WITH INTRAOPERATIVE CHOLANGIOGRAM;  Surgeon: Craig Keens, DO;  Location: [place];  Service: General;  Laterality: N/A;    Current Outpatient Prescriptions  Medication Sig Dispense Refill  . aspirin 81 MG tablet Take 81 mg by mouth daily.      . clopidogrel (PLAVIX) 75 MG tablet TAKE 1 TABLET EVERY DAY 90 tablet 4  . COENZYME Q-10 PO Take 1 tablet by mouth daily.      . fish oil-omega-3 fatty acids 1000 MG capsule Take 1 g by mouth daily.     . furosemide (LASIX) 80 MG tablet Take 80 mg by mouth daily.     Marland Kitchen glipiZIDE (GLUCOTROL) 10 MG tablet Take 20 mg by mouth 2 (two) times daily.      . insulin aspart protamine-insulin aspart (NOVOLOG 70/30) (70-30) 100 UNIT/ML injection Inject 45-55 Units into the skin 3 (three) times daily with meals. Takes 3 times a day with a meal.  If blood sugar <150, then uses 45 units.  If blood sugar greater than or equal 200 uses 55 units     . isosorbide mononitrate (IMDUR) 60 MG 24 hr tablet Take 1 tablet (60 mg  total) by mouth daily. 90 tablet 3  . losartan (COZAAR) 100 MG tablet TAKE 1 TABLET BY MOUTH EVERY DAY 90 tablet 1  . metaxalone (SKELAXIN) 800 MG tablet   2  . mometasone (NASONEX) 50 MCG/ACT nasal spray Place 2 sprays into the nose daily as needed.    . Multiple Vitamin (MULTIVITAMIN) capsule Take 1 capsule by mouth daily.      . potassium chloride (KLOR-CON M10) 10 MEQ tablet 2 tablets daily (Patient taking differently: 4 tablets daily) 180 tablet 4  . predniSONE (DELTASONE) 10 MG tablet TAKE 1 TABLET BY MOUTH EVERY DAY AS DIRECTED 90 tablet 4  . Vitamin D, Ergocalciferol, (DRISDOL) 50000 UNITS CAPS capsule   1   No current facility-administered medications for this visit.    Allergies:    Allergies  Allergen Reactions  . Hydrocortisone Nausea Only  . Statins Other  (See Comments)    Pt says they make him "mean"    Social History:  The patient  reports that he has never smoked. He has never used smokeless tobacco. He reports that he does not drink alcohol or use illicit drugs.   Family History  Problem Relation Age of Onset  . Asthma Sister   . Anesthesia problems Sister     "Kidney's did not wake up"  . Kidney disease Mother   . Diabetes Mother   . Kidney cancer Mother   . Cancer Mother     kidney  . Heart attack Father     ROS:  Please see the history of present illness.   Denies any syncope, bleeding, orthopnea, PND.   All other systems reviewed and negative.   PHYSICAL EXAM: VS:  BP 110/72 mmHg  Pulse 103  Ht 5\' 10"  (1.778 m)  Wt 253 lb (114.76 kg)  BMI 36.30 kg/m2 Well nourished, well developed, in no acute distress HEENT: normal, [place]/AT, EOMI Neck: no JVD, normal carotid upstroke, no bruit Cardiac:  normal S1, S2; RRR; no murmur Lungs:  clear to auscultation bilaterally, no wheezing, rhonchi or rales Abd: soft, nontender, no hepatomegaly, no bruits Ext: no edema, 2+ distal pulses Skin: warm and dry GU: deferred Neuro: no focal abnormalities noted, AAO x 3  EKG:  None today  ASSESSMENT AND PLAN:  1. Chronic diastolic heart failure-in the past, he has not tolerated beta blockers. I will try bisoprolol 5 mg once a day. Continue with other medications. 2. Chronic stable angina-chest discomfort when exerting himself, rests, goes away. Refilling isosorbide. Prior catheterization repeat as above. 3. Obesity-encourage weight loss. 4. Diabetes-uncontrolled-include an A1c in the 9 range. Continue to work with [name]. 5. Pulmonary sarcoid-Dr. Joya [name]. Prednisone. 6. Hyperlipidemia-hypertriglyceridemia in the setting of uncontrolled diabetes. This will be challenging control as long as his lipids are elevated. 7. We will see back in 6 months.  Signed, Craig Furbish, MD [facility]  01/14/2014 4:38 PM

## 2014-02-06 ENCOUNTER — Ambulatory Visit (INDEPENDENT_AMBULATORY_CARE_PROVIDER_SITE_OTHER): Payer: BC Managed Care – PPO | Admitting: Pulmonary Disease

## 2014-02-06 ENCOUNTER — Encounter: Payer: Self-pay | Admitting: Pulmonary Disease

## 2014-02-06 VITALS — BP 108/64 | HR 85 | Temp 98.1°F | Ht 70.0 in | Wt 266.0 lb

## 2014-02-06 DIAGNOSIS — I5032 Chronic diastolic (congestive) heart failure: Secondary | ICD-10-CM

## 2014-02-06 DIAGNOSIS — D860 Sarcoidosis of lung: Secondary | ICD-10-CM

## 2014-02-06 MED ORDER — FUROSEMIDE 40 MG PO TABS: ORAL_TABLET | ORAL | Status: DC

## 2014-02-06 MED ORDER — PREDNISONE 20 MG PO TABS: 20.0000 mg | ORAL_TABLET | Freq: Every day | ORAL | Status: AC

## 2014-02-06 NOTE — Progress Notes (Signed)
   Subjective:    Patient ID: [name], male    DOB: August 30, 1945, 68 y.o.   MRN: [id number]  HPI  68 y.o. white male with a history of severe pulmonary sarcoidosis with mediastinal and hilar lymph node & parenchymal involvement. Also severe OSA.   Failed Arava and Imuran due to side effects.   08/01/2013 02/06/2014 Chief Complaint  Patient presents with  . Acute Visit    CXR abnormal, lungs full of "sarcoidosis";  hard to breathe, chest tightness   He developed increased dyspnea & pedal edema  - CXR 11/22 BL chronic infx Given by PCP -levaquin x 5ds, pred increased to 60 mg x 3ds, 40 x 3ds , now back down to baseline 10 mg Still feels dyspneic, wonders if sarcoid is acting up Takes 80 mg lasix daily No sputum, no clear URI at onset  09/2011 - echo gr 3 diast dysfn, elevated RVSP  Review of Systems neg for any significant sore throat, dysphagia, itching, sneezing, nasal congestion or excess/ purulent secretions, fever, chills, sweats, unintended wt loss, pleuritic or exertional cp, hempoptysis, orthopnea pnd  Also denies presyncope, palpitations, heartburn, abdominal pain, nausea, vomiting, diarrhea or change in bowel or urinary habits, dysuria,hematuria, rash, arthralgias, visual complaints, headache, numbness weakness or ataxia.     Objective:   Physical Exam  Gen. Pleasant, obese, in no distress ENT - no lesions, no post nasal drip Neck: No JVD, no thyromegaly, no carotid bruits Lungs: no use of accessory muscles, no dullness to percussion, decreased without rales or rhonchi  Cardiovascular: Rhythm regular, heart sounds  normal, no murmurs or gallops, no peripheral edema Musculoskeletal: No deformities, no cyanosis or clubbing , no tremors       Assessment & Plan:

## 2014-02-06 NOTE — Patient Instructions (Signed)
Increase lasix to 80 mg in am& additional 40 mg around 3pm x 1 week Monitor weight daily Check blood work - BMET - in 1 week for kidney function Increase prednisone to 20 mg x 1 week, then back down to 10 mg

## 2014-02-07 NOTE — Assessment & Plan Note (Signed)
More likely worsening diastolic chf Increase lasix to 80 mg in am& additional 40 mg around 3pm x 1 week Monitor weight daily Check blood work - BMET - in 1 week for kidney function -he will get this with pcp

## 2014-02-07 NOTE — Assessment & Plan Note (Signed)
Not typical for sarcoid flare Increase prednisone to 20 mg x 1 week, then back down to 10 mg

## 2014-02-13 ENCOUNTER — Encounter (HOSPITAL_COMMUNITY): Payer: Self-pay | Admitting: Cardiology

## 2014-02-20 ENCOUNTER — Ambulatory Visit (HOSPITAL_BASED_OUTPATIENT_CLINIC_OR_DEPARTMENT_OTHER)
Admission: RE | Admit: 2014-02-20 | Discharge: 2014-02-20 | Disposition: A | Discharge status: Home / Self Care | Payer: BC Managed Care – PPO | Source: Ambulatory Visit | Attending: Critical Care Medicine | Admitting: Critical Care Medicine

## 2014-02-20 ENCOUNTER — Telehealth: Payer: Self-pay | Admitting: *Deleted

## 2014-02-20 ENCOUNTER — Ambulatory Visit (INDEPENDENT_AMBULATORY_CARE_PROVIDER_SITE_OTHER): Payer: BC Managed Care – PPO | Admitting: Critical Care Medicine

## 2014-02-20 ENCOUNTER — Encounter: Payer: Self-pay | Admitting: Critical Care Medicine

## 2014-02-20 VITALS — BP 100/62 | HR 88 | Temp 98.0°F | Ht 70.5 in | Wt 264.0 lb

## 2014-02-20 DIAGNOSIS — I7090 Unspecified atherosclerosis: Secondary | ICD-10-CM | POA: Insufficient documentation

## 2014-02-20 DIAGNOSIS — J984 Other disorders of lung: Secondary | ICD-10-CM | POA: Insufficient documentation

## 2014-02-20 DIAGNOSIS — K760 Fatty (change of) liver, not elsewhere classified: Secondary | ICD-10-CM | POA: Diagnosis not present

## 2014-02-20 DIAGNOSIS — D869 Sarcoidosis, unspecified: Secondary | ICD-10-CM | POA: Diagnosis present

## 2014-02-20 DIAGNOSIS — R918 Other nonspecific abnormal finding of lung field: Secondary | ICD-10-CM | POA: Diagnosis not present

## 2014-02-20 DIAGNOSIS — I509 Heart failure, unspecified: Secondary | ICD-10-CM | POA: Diagnosis not present

## 2014-02-20 DIAGNOSIS — N62 Hypertrophy of breast: Secondary | ICD-10-CM | POA: Diagnosis not present

## 2014-02-20 DIAGNOSIS — R0602 Shortness of breath: Secondary | ICD-10-CM | POA: Diagnosis present

## 2014-02-20 DIAGNOSIS — I10 Essential (primary) hypertension: Secondary | ICD-10-CM | POA: Insufficient documentation

## 2014-02-20 IMAGING — CT CT CHEST W/O CM
2 of 3 series · 15 of 36 positions shown, 18 images · non-contrast
Comparison: Prior CTs [DATE] and [DATE].

CLINICAL DATA: Sarcoidosis flare up. Shortness of breath. History
of congestive heart failure and hypertension. Subsequent encounter.

EXAM:
CT CHEST WITHOUT CONTRAST
TECHNIQUE: Multidetector CT imaging of the chest was performed following the
standard protocol without IV contrast..

[Series 2: chest 5.0 b31f · axial · 0.71mm/px · z∈[+22,+287]mm · 12 of 63 slices shown, 15 images]
[im 5/63  mediastinal]
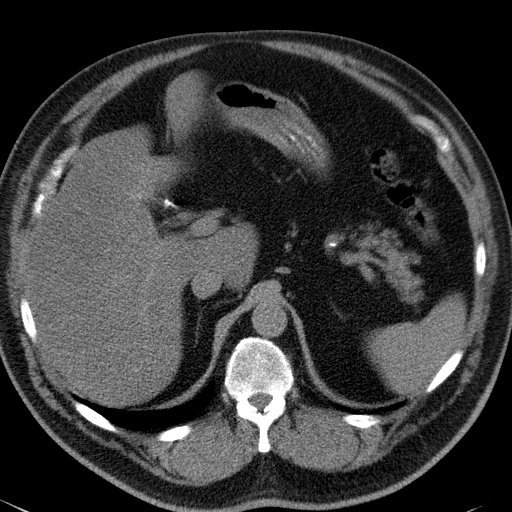
[im 5/63  lung]
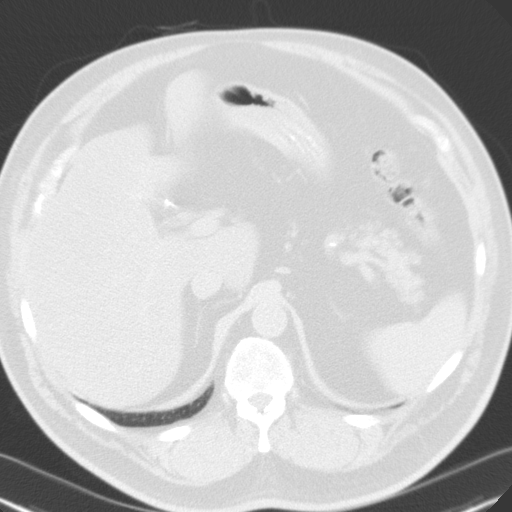
[im 10/63  lung]
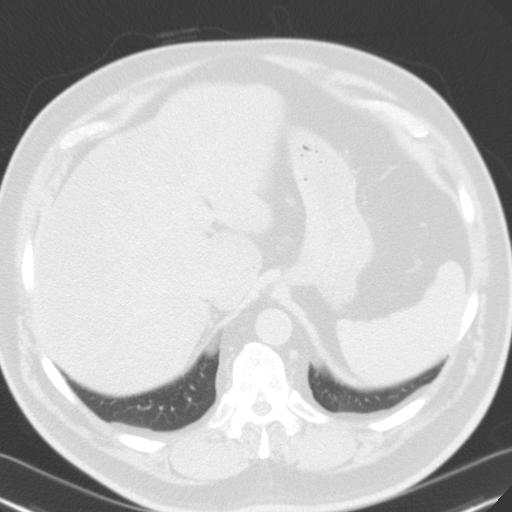
[im 14/63  lung]
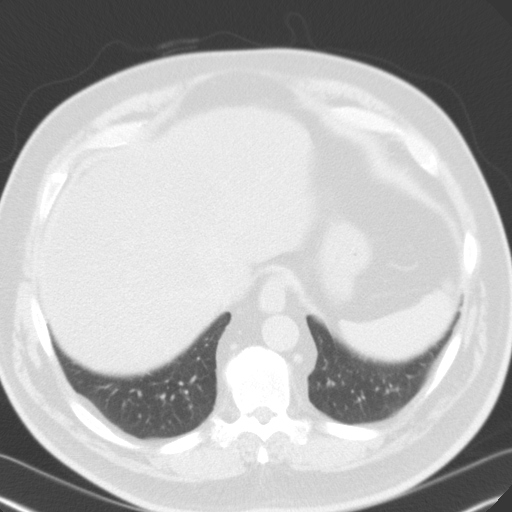
[im 19/63  lung]
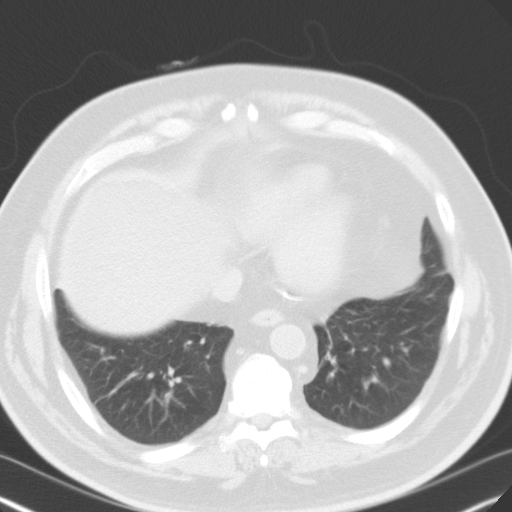
[im 23/63  mediastinal]
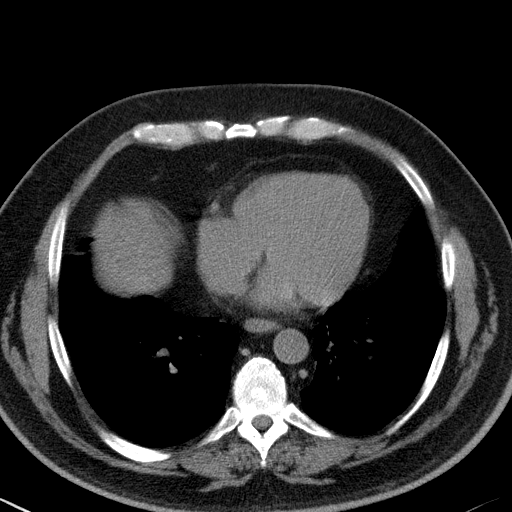
[im 23/63  lung]
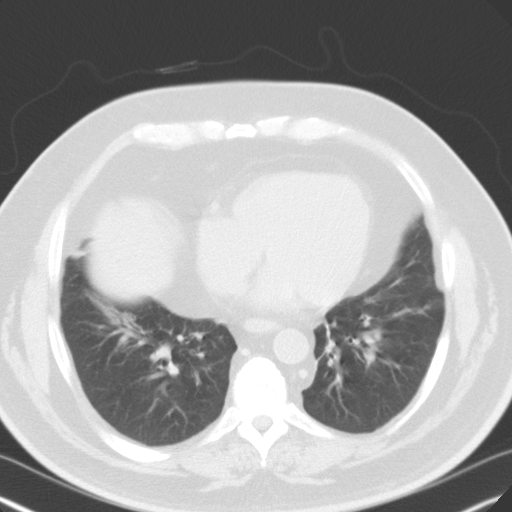
[im 28/63  lung]
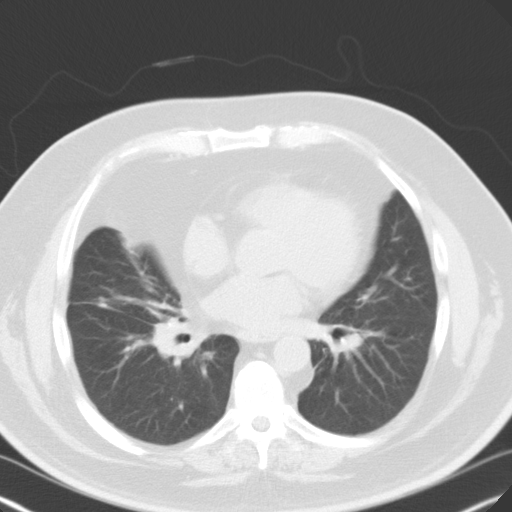
[im 35/63  lung]
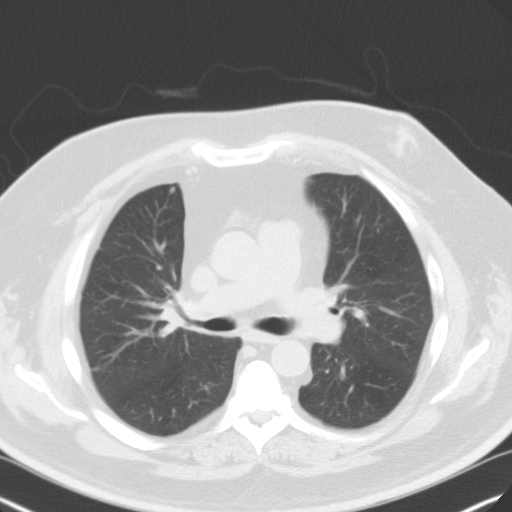
[im 40/63  lung]
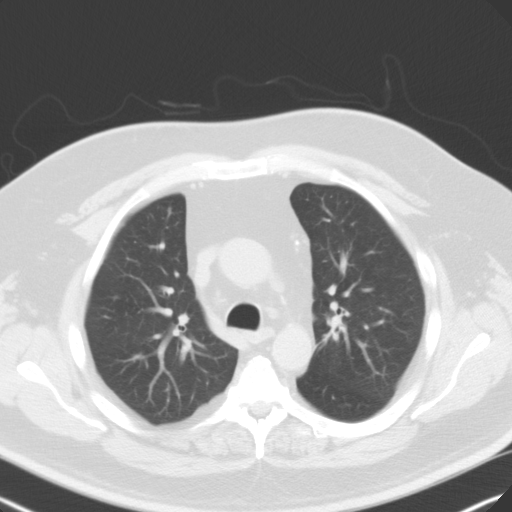
[im 44/63  mediastinal]
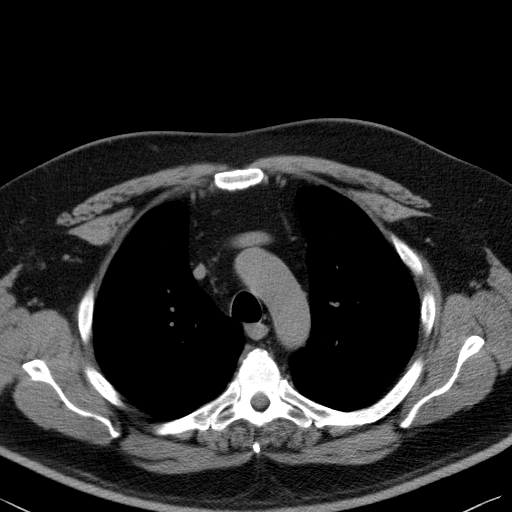
[im 44/63  lung]
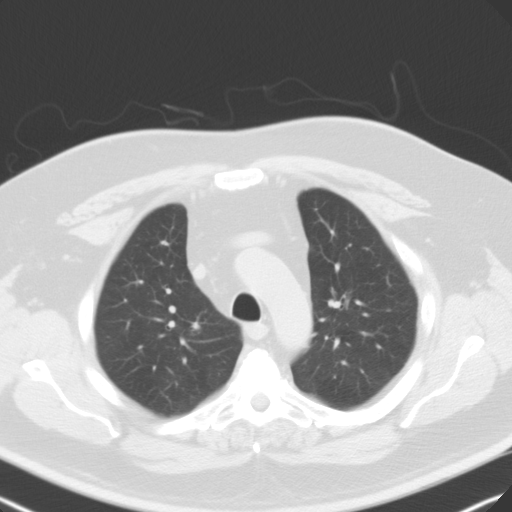
[im 49/63  lung]
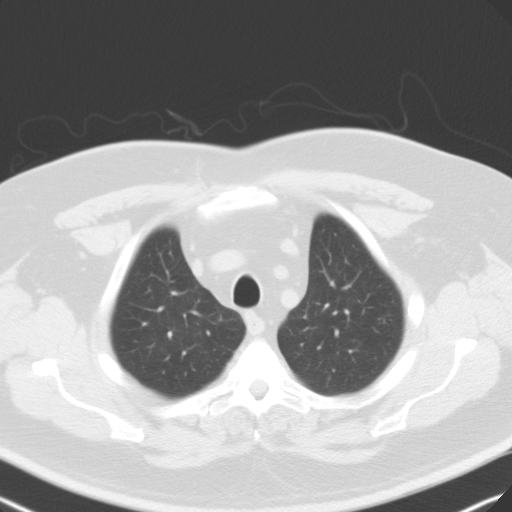
[im 53/63  lung]
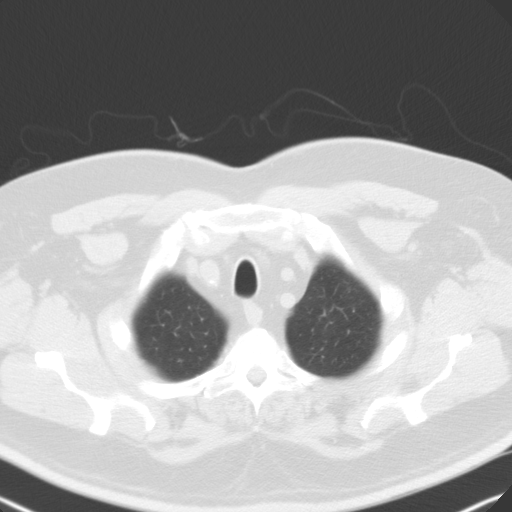
[im 58/63  lung]
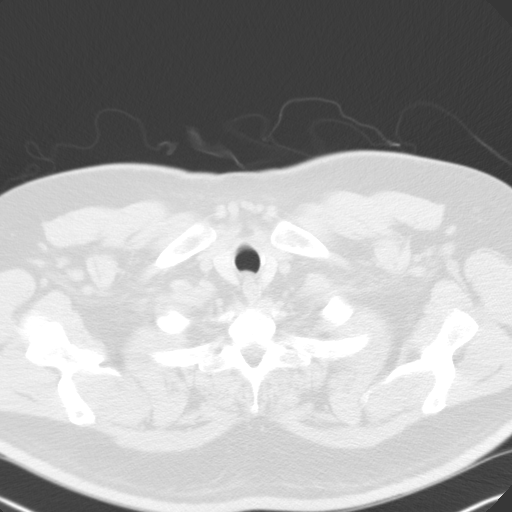

[Series 6: chest 3.0 coronal · coronal · 0.64mm/px · 3 of 103 slices shown]
[im 21/103  lung]
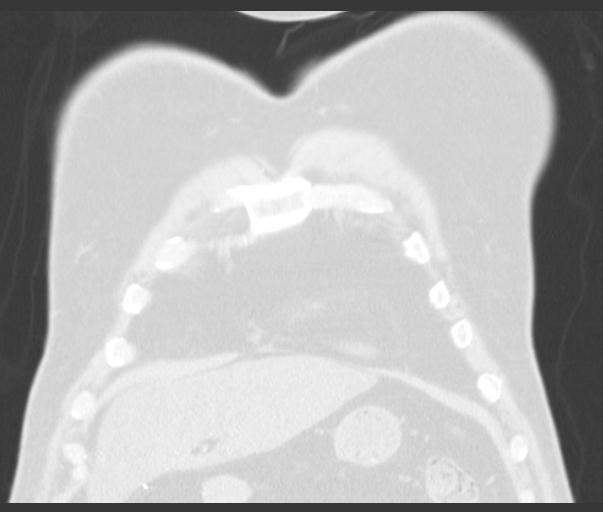
[im 41/103  lung]
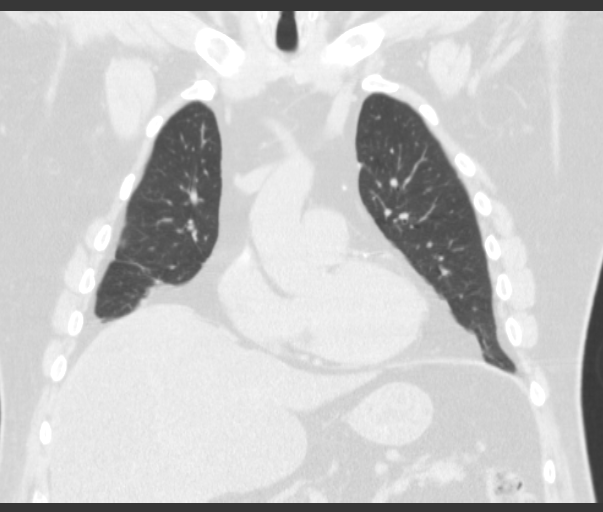
[im 62/103  lung]
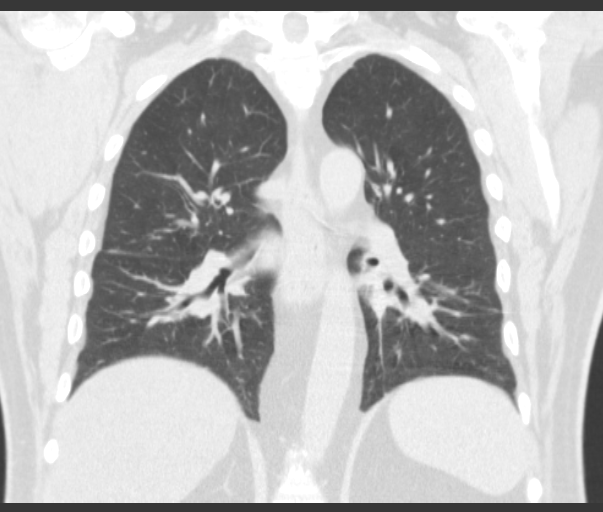

[15 of 36 positions shown; findings below may reference images not displayed]

FINDINGS: Mediastinum: As evaluated in the noncontrast state, there is no
evidence of mediastinal or hilar adenopathy. Hilar evaluation is
limited without contrast, although the hilar contours are grossly
stable. The thyroid gland, trachea and esophagus demonstrate no
significant findings. The heart size is normal. There is no
pericardial effusion.There is stable mild atherosclerosis. There is
a probable coronary artery stent.

Lungs/Pleura: There is no pleural effusion.There is stable mild
chronic linear scarring in both lung bases and mild thickening of
the bronchovascular bundles. 6 mm subpleural nodule medially in the
right lower lobe on image 31 appears stable. There is a stable 4 mm
nodule anteriorly in the right upper lobe on image number 29. No new
or enlarging nodules are demonstrated

Upper abdomen: There is severe slightly heterogeneous hepatic
steatosis. No adrenal mass.

Musculoskeletal/Chest wall: There is no axillary adenopathy or chest
wall mass. Mild bilateral gynecomastia noted. There are no worrisome
osseous findings.
IMPRESSION: 1. No acute chest findings demonstrated. There is stable linear
scarring in both lung bases and mild right lung nodularity.
2. No evidence of adenopathy or pleural effusion.
3. Hepatic steatosis.

## 2014-02-20 MED ORDER — POTASSIUM CHLORIDE CRYS ER 10 MEQ PO TBCR: EXTENDED_RELEASE_TABLET | ORAL | Status: DC

## 2014-02-20 MED ORDER — PREDNISONE 10 MG PO TABS: ORAL_TABLET | ORAL | Status: DC

## 2014-02-20 MED ORDER — FUROSEMIDE 40 MG PO TABS: ORAL_TABLET | ORAL | Status: DC

## 2014-02-20 NOTE — Progress Notes (Signed)
Subjective:    Patient ID: [name], male    DOB: 14-Jan-1946, 68 y.o.   MRN: [id number]  HPI  This is a 68 y.o.   white male with a history of severe pulmonary sarcoidosis with primary mediastinal and hilar lymph node involvement. The patient also has elements of parenchymal involvement. Also severe OSA. The patient had failed [place] therapy. The patient also had adverse side effects Imuran.  Failed Arava and Imuran due to side effects.   02/20/2014 Chief Complaint  Patient presents with  . Follow-up    f/u sarcoidosis; not feeling any better, still sob, congested, wheezing  Pt seen by RA 12/3 and now no better . Pt given more lasix and brief pred pulse Pt worse 1 month ago with acute dyspnea.  Pt went to urgent care: ??[facility].  Pt had pred 60mg /d x 4 day then back to 10mg  /d  And levaquin x 5 days. Pt had yellow green mucus , this cleared with ABX.  No fever at that time.  Noted chest tightness.  Severe dyspnea. Neb med rx helped, now has at home. Rx inhaler as well albuterol and helped.  Fluid removed but dyspnea is the same.   Worse with walking    Review of Systems Constitutional:   No  weight loss, night sweats,  Fevers, chills, fatigue, lassitude. HEENT:   No headaches,  Difficulty swallowing,  Tooth/dental problems,  Sore throat,                No sneezing, itching, ear ache, nasal congestion, post nasal drip,   CV:  No chest pain,  Orthopnea, PND, swelling in lower extremities, anasarca, dizziness, palpitations  GI  No heartburn, indigestion, abdominal pain, nausea, vomiting, diarrhea, change in bowel habits, loss of appetite  Resp: Notes shortness of breath with exertion or at rest.  No excess mucus, no productive cough,  No non-productive cough,  No coughing up of blood.  Notes  change in color of mucus.  No wheezing.  No chest wall deformity  Skin: no rash or lesions.  GU: no dysuria, change in color of urine, no urgency or frequency.  No flank pain.  MS:  No joint pain  or swelling.  No decreased range of motion.  No back pain.  Psych:  No change in mood or affect. No depression or anxiety.  No memory loss.      Objective:   Physical Exam    Filed Vitals:   02/20/14 0903  BP: 100/62  Pulse: 88  Temp: 98 F (36.7 C)  TempSrc: Oral  Height: 5' 10.5" (1.791 m)  Weight: 264 lb (119.75 kg)  SpO2: 95%    Gen: Pleasant, cushingoid facies , in no distress,  normal affect  ENT: No lesions,  mouth clear,  oropharynx clear,   No  postnasal drip  Neck: No JVD, no TMG, no carotid bruits  Lungs: No use of accessory muscles, no dullness to percussion, distant BS  Cardiovascular: RRR, heart sounds normal, no murmur or gallops, no peripheral edema  Abdomen: soft and NT, no HSM,  BS normal  Musculoskeletal: No deformities, no cyanosis or clubbing  Neuro: alert, non focal  Skin: Warm, no lesions or rashes  CT Chest 02/20/2014: no progression of sarcoidosis , stable mediastinal LAN   Assessment & Plan:   Sarcoidosis Sarcoidosis with airway component and flare, also volume excess Note CT Chest done today does NOT show any sarcoid progression Plan Increase prednisone 30mg  per day for 4  days then 20mg  a day and STAY Increase lasix 80mg  twice daily Increase potassium 2 two times daily No oxygen needed Return 2 months     Updated Medication List Outpatient Encounter Prescriptions as of 02/20/2014  Medication Sig  . aspirin 81 MG tablet Take 81 mg by mouth daily.    . bisoprolol (ZEBETA) 5 MG tablet Take 1 tablet (5 mg total) by mouth daily.  . clopidogrel (PLAVIX) 75 MG tablet TAKE 1 TABLET EVERY DAY  . COENZYME Q-10 PO Take 1 tablet by mouth daily.    . fish oil-omega-3 fatty acids 1000 MG capsule Take 1 g by mouth daily.   . furosemide (LASIX) 40 MG tablet Take 2 tablets in AM and 2 PM daily  . glipiZIDE (GLUCOTROL) 10 MG tablet Take 20 mg by mouth 2 (two) times daily.    . insulin aspart protamine-insulin aspart (NOVOLOG 70/30) (70-30)  100 UNIT/ML injection Inject 45-55 Units into the skin 3 (three) times daily with meals. Takes 3 times a day with a meal.  If blood sugar <150, then uses 45 units.  If blood sugar greater than or equal 200 uses 55 units   . isosorbide mononitrate (IMDUR) 60 MG 24 hr tablet Take 1 tablet (60 mg total) by mouth daily.  Marland Kitchen losartan (COZAAR) 100 MG tablet TAKE 1 TABLET BY MOUTH EVERY DAY  . mometasone (NASONEX) 50 MCG/ACT nasal spray Place 2 sprays into the nose daily as needed.  . Multiple Vitamin (MULTIVITAMIN) capsule Take 1 capsule by mouth daily.    . potassium chloride (KLOR-CON M10) 10 MEQ tablet 2 AM and 2 PM  . Vitamin D, Ergocalciferol, (DRISDOL) 50000 UNITS CAPS capsule   . [DISCONTINUED] furosemide (LASIX) 40 MG tablet Take 2 tablets in AM and 1 tablet around 3 pm x 1 week  . [DISCONTINUED] potassium chloride (KLOR-CON M10) 10 MEQ tablet 2 tablets daily (Patient taking differently: 3 (three) times daily. 4 tablets daily)  . [DISCONTINUED] potassium chloride (KLOR-CON M10) 10 MEQ tablet 2 AM and 1 PM  . [DISCONTINUED] predniSONE (DELTASONE) 10 MG tablet TAKE 1 TABLET BY MOUTH EVERY DAY AS DIRECTED  . [DISCONTINUED] predniSONE (DELTASONE) 10 MG tablet Take 3 per day x 4 days then 2 a day and stay  . [DISCONTINUED] furosemide (LASIX) 80 MG tablet Take 80 mg by mouth daily.

## 2014-02-20 NOTE — Patient Instructions (Addendum)
Increase prednisone 30mg  per day for 4 days then 20mg  a day and STAY Increase lasix 80mg  twice daily Increase potassium 2 two times daily CT Chest today no contrast No oxygen needed Return 2 months

## 2014-02-20 NOTE — Telephone Encounter (Signed)
Received failed rx transmission notice on pred rx sent during OV today. Called [facility], spoke with [place].  Verified they did not receive rx and gave VO.  Kent verbalized understanding and voiced no further questions or concerns at this time.

## 2014-02-20 NOTE — Assessment & Plan Note (Signed)
Sarcoidosis with airway component and flare, also volume excess Note CT Chest done today does NOT show any sarcoid progression Plan Increase prednisone 30mg  per day for 4 days then 20mg  a day and STAY Increase lasix 80mg  twice daily Increase potassium 2 two times daily No oxygen needed Return 2 months

## 2014-03-03 ENCOUNTER — Encounter: Payer: Self-pay | Admitting: Cardiology

## 2014-03-03 ENCOUNTER — Other Ambulatory Visit: Payer: Self-pay | Admitting: *Deleted

## 2014-03-03 ENCOUNTER — Telehealth: Payer: Self-pay | Admitting: Cardiology

## 2014-03-03 ENCOUNTER — Ambulatory Visit (INDEPENDENT_AMBULATORY_CARE_PROVIDER_SITE_OTHER): Payer: BC Managed Care – PPO | Admitting: Cardiology

## 2014-03-03 ENCOUNTER — Other Ambulatory Visit (INDEPENDENT_AMBULATORY_CARE_PROVIDER_SITE_OTHER): Payer: BC Managed Care – PPO

## 2014-03-03 VITALS — BP 128/66 | HR 84 | Ht 70.0 in | Wt 265.0 lb

## 2014-03-03 DIAGNOSIS — R0600 Dyspnea, unspecified: Secondary | ICD-10-CM

## 2014-03-03 DIAGNOSIS — R079 Chest pain, unspecified: Secondary | ICD-10-CM

## 2014-03-03 DIAGNOSIS — E785 Hyperlipidemia, unspecified: Secondary | ICD-10-CM

## 2014-03-03 DIAGNOSIS — R0602 Shortness of breath: Secondary | ICD-10-CM

## 2014-03-03 LAB — BASIC METABOLIC PANEL
BUN: 27 mg/dL — ABNORMAL HIGH (ref 6–23)
CO2: 27 meq/L (ref 19–32)
Calcium: 8.3 mg/dL — ABNORMAL LOW (ref 8.4–10.5)
Chloride: 101 mEq/L (ref 96–112)
Creatinine, Ser: 1.5 mg/dL (ref 0.4–1.5)
GFR: 47.95 mL/min — ABNORMAL LOW (ref >60.00)
Glucose, Bld: 290 mg/dL — ABNORMAL HIGH (ref 70–99)
Potassium: 4.2 mEq/L (ref 3.5–5.1)
SODIUM: 138 meq/L (ref 135–145)

## 2014-03-03 NOTE — Telephone Encounter (Signed)
New Msg        Pt believes he has another blocked artery and may need another stress test. Please call [phone].

## 2014-03-03 NOTE — Telephone Encounter (Signed)
Called pt - states he started having CP and SOB about 3 weeks after seeing Dr Marlou Porch last (November)  Has been treated for his "lung condition" but s/s are not improving.  States he feels like he did before having stents placed.  Appt scheduled for eval by DrSkains today at 3:15.  Pt states understanding.

## 2014-03-03 NOTE — Patient Instructions (Signed)
The current medical regimen is effective;  continue present plan and medications.  Please have blood work today.  (BMP)  Your physician has requested that you have a lexiscan myoview. For further information please visit [email or web address]. Please follow instruction sheet, as given.  Your physician has requested that you have an echocardiogram. Echocardiography is a painless test that uses sound waves to create images of your heart. It provides your doctor with information about the size and shape of your heart and how well your heart's chambers and valves are working. This procedure takes approximately one hour. There are no restrictions for this procedure.  Follow up in 2 months with Dr Marlou Porch.

## 2014-03-03 NOTE — Progress Notes (Signed)
[facility]. [address], [address] [place], [place]  [address] Phone: [phone] Fax:  [phone]  Date:  03/03/2014   ID:  Dasani, Crear 12-19-1945, MRN [id number]  PCP:  Shirline Frees, MD   History of Present Illness: [name] is a 68 y.o. male with coronary artery disease, drug eluting stent to LAD and posterior lateral branch 10/2009, cholecystectomy November 2012, history of pulmonary sarcoid. Cardiac catheterization 10/12/11 that revealed patent LAD, small vessels with normal left ventricular function of 55%, increased left ventricular end-diastolic pressure. He has had chronic symptoms of shortness of breath,  decreased endurance which has recently worsened. He is also been feelings diaphoresis. Checked blood sugars at the time which were normal. Recently had his prednisone increased. No evidence of increase sarcoidosis on CT scan. Difficult diabetes. Dr. Joya Gaskins on chronic prednisone therapy which seemed to help with lower extremity discomfort.  Wife surgery stroke after neck surgery. Daughter killed in tractor accident.  He has been out of isosorbide. In the past, not able to tolerate beta blockers. We will try again.   Wt Readings from Last 3 Encounters:  03/03/14 265 lb (120.203 kg)  02/20/14 264 lb (119.75 kg)  02/06/14 266 lb (120.657 kg)     Past Medical History  Diagnosis Date  . Type 2 diabetes mellitus   . Sarcoid   . Adrenal insufficiency   . Hypertension   . OSA (obstructive sleep apnea)     uses VPAC sleep study 2 years done through [facility]. Dr. Marlou Porch arranged study  . Coronary artery disease     has stents  . Heart attack 2010  . Pneumonia     3-4 years ago  . Diabetes mellitus     insulin and pills  . Coronary atherosclerosis of native coronary artery     Proximal LAD, posterior lateral stent widely patent-10/12/11    Past Surgical History  Procedure Laterality Date  . Coronary stent placement  2009    in LAD and side branch PTCA  . Cataract  extraction  2011    bilat  . Intercostal nerve block  2011    x2. lumbar spine  . Appendectomy    . Coronary angioplasty      most recent 11/2009  . Cholecystectomy  01/25/2011    Procedure: LAPAROSCOPIC CHOLECYSTECTOMY WITH INTRAOPERATIVE CHOLANGIOGRAM;  Surgeon: Judieth Keens, DO;  Location: [facility];  Service: General;  Laterality: N/A;  . Left heart catheterization with coronary angiogram Bilateral 10/12/2011    Procedure: LEFT HEART CATHETERIZATION WITH CORONARY ANGIOGRAM;  Surgeon: Candee Furbish, MD;  Location: [facility] CATH LAB;  Service: Cardiovascular;  Laterality: Bilateral;    Current Outpatient Prescriptions  Medication Sig Dispense Refill  . aspirin 81 MG tablet Take 81 mg by mouth daily.      . bisoprolol (ZEBETA) 5 MG tablet Take 1 tablet (5 mg total) by mouth daily. 90 tablet 3  . clopidogrel (PLAVIX) 75 MG tablet TAKE 1 TABLET EVERY DAY 90 tablet 4  . COENZYME Q-10 PO Take 1 tablet by mouth daily.      . fish oil-omega-3 fatty acids 1000 MG capsule Take 1 g by mouth daily.     . furosemide (LASIX) 40 MG tablet Take 2 tablets in AM and 2 PM daily 100 tablet 0  . glipiZIDE (GLUCOTROL) 10 MG tablet Take 20 mg by mouth 2 (two) times daily.      . insulin aspart protamine-insulin aspart (NOVOLOG 70/30) (70-30)  100 UNIT/ML injection Inject 45-55 Units into the skin 3 (three) times daily with meals. Takes 3 times a day with a meal.  If blood sugar <150, then uses 45 units.  If blood sugar greater than or equal 200 uses 55 units     . isosorbide mononitrate (IMDUR) 60 MG 24 hr tablet Take 1 tablet (60 mg total) by mouth daily. 90 tablet 3  . losartan (COZAAR) 100 MG tablet TAKE 1 TABLET BY MOUTH EVERY DAY 90 tablet 1  . mometasone (NASONEX) 50 MCG/ACT nasal spray Place 2 sprays into the nose daily as needed.    . Multiple Vitamin (MULTIVITAMIN) capsule Take 1 capsule by mouth daily.      . potassium chloride (KLOR-CON M10) 10 MEQ tablet 2 AM and 2 PM 180 tablet 4  . predniSONE (DELTASONE)  10 MG tablet Take 3 per day x 4 days then 2 a day and stay 90 tablet 4  . Vitamin D, Ergocalciferol, (DRISDOL) 50000 UNITS CAPS capsule   1   No current facility-administered medications for this visit.    Allergies:    Allergies  Allergen Reactions  . Hydrocortisone Nausea Only  . Statins Other (See Comments)    Pt says they make him "mean"    Social History:  The patient  reports that he has never smoked. He has never used smokeless tobacco. He reports that he does not drink alcohol or use illicit drugs.   Family History  Problem Relation Age of Onset  . Asthma Sister   . Anesthesia problems Sister     "Kidney's did not wake up"  . Kidney disease Mother   . Diabetes Mother   . Kidney cancer Mother   . Cancer Mother     kidney  . Heart attack Father     ROS:  Please see the history of present illness.   Denies any syncope, bleeding, orthopnea, PND.   All other systems reviewed and negative.   PHYSICAL EXAM: VS:  BP 128/66 mmHg  Pulse 84  Ht 5\' 10"  (1.778 m)  Wt 265 lb (120.203 kg)  BMI 38.02 kg/m2 Well nourished, well developed, in no acute distress HEENT: normal, Time/AT, EOMI Neck: no JVD, normal carotid upstroke, no bruit Cardiac:  normal S1, S2; RRR; no murmur Lungs:  clear to auscultation bilaterally, no wheezing, rhonchi or rales Abd: soft, nontender, no hepatomegaly, no bruitsobese Ext: no edema, 2+ distal pulses Skin: warm and drythickened GU: deferred Neuro: no focal abnormalities noted, AAO x 3  EKG:  03/03/14-sinus rhythm, LAFB, septal infarct, no ST segment changes, heart rate 84 bpm.  ASSESSMENT AND PLAN:  1. Chronic diastolic heart failure-in the past, he has not tolerated beta blockers. I will try bisoprolol 5 mg once a day. Continue with other medications. 2. Chronic stable angina/dyspnea-chest discomfort when exerting himself, rests, goes away. Feeling diaphoresis-and if this is side effect of increased prednisone, hard to tell. Last evaluation  2013 showed patent stents. I'm going to proceed with echocardiogram to ensure that he has normal EF and check nuclear stress test given his prior PCI's. Prior catheterization repeat as above. 3. Fluid overload-Dr. Joya Gaskins recently has increased his Lasix. I will check basic metabolic profile. I agree with this especially in light of his increased prednisone. 4. Obesity-encourage weight loss. 5. Diabetes-uncontrolled-previously A1c in the 9 range. Continue to work with Dr. Kenton Kingfisher. 6. Pulmonary sarcoid-Dr. Joya Gaskins. Prednisone. No recent increase on CT. 7. Hyperlipidemia-hypertriglyceridemia in the setting of uncontrolled diabetes. This will be  challenging control as long as his lipids are elevated. 8. We will see back in 2 months.  Signed, Candee Furbish, MD [facility]  03/03/2014 3:33 PM    .

## 2014-03-04 ENCOUNTER — Telehealth (HOSPITAL_COMMUNITY): Payer: Self-pay

## 2014-03-04 ENCOUNTER — Ambulatory Visit (HOSPITAL_COMMUNITY)
Admission: RE | Admit: 2014-03-04 | Discharge: 2014-03-04 | Disposition: A | Discharge status: Home / Self Care | Payer: BC Managed Care – PPO | Source: Ambulatory Visit | Attending: Cardiology | Admitting: Cardiology

## 2014-03-04 DIAGNOSIS — R0600 Dyspnea, unspecified: Secondary | ICD-10-CM | POA: Insufficient documentation

## 2014-03-04 DIAGNOSIS — R0602 Shortness of breath: Secondary | ICD-10-CM

## 2014-03-04 DIAGNOSIS — R079 Chest pain, unspecified: Secondary | ICD-10-CM

## 2014-03-04 NOTE — Telephone Encounter (Signed)
Encounter complete. 

## 2014-03-04 NOTE — Progress Notes (Signed)
2D Echocardiogram Complete.  03/04/2014   Deliah Boston, RDCS   Preliminary Technician Findings:  This was a technically difficult study due to large patient body habitus and rib spacing.

## 2014-03-05 ENCOUNTER — Ambulatory Visit (HOSPITAL_COMMUNITY)
Admission: RE | Admit: 2014-03-05 | Discharge: 2014-03-05 | Disposition: A | Discharge status: Home / Self Care | Payer: BC Managed Care – PPO | Source: Ambulatory Visit | Attending: Cardiology | Admitting: Cardiology

## 2014-03-05 DIAGNOSIS — E785 Hyperlipidemia, unspecified: Secondary | ICD-10-CM | POA: Diagnosis not present

## 2014-03-05 DIAGNOSIS — Z8249 Family history of ischemic heart disease and other diseases of the circulatory system: Secondary | ICD-10-CM | POA: Diagnosis not present

## 2014-03-05 DIAGNOSIS — I10 Essential (primary) hypertension: Secondary | ICD-10-CM | POA: Diagnosis not present

## 2014-03-05 DIAGNOSIS — E669 Obesity, unspecified: Secondary | ICD-10-CM | POA: Insufficient documentation

## 2014-03-05 DIAGNOSIS — R42 Dizziness and giddiness: Secondary | ICD-10-CM | POA: Diagnosis not present

## 2014-03-05 DIAGNOSIS — R110 Nausea: Secondary | ICD-10-CM | POA: Insufficient documentation

## 2014-03-05 DIAGNOSIS — R5383 Other fatigue: Secondary | ICD-10-CM | POA: Diagnosis not present

## 2014-03-05 DIAGNOSIS — I2510 Atherosclerotic heart disease of native coronary artery without angina pectoris: Secondary | ICD-10-CM | POA: Insufficient documentation

## 2014-03-05 DIAGNOSIS — R079 Chest pain, unspecified: Secondary | ICD-10-CM | POA: Diagnosis not present

## 2014-03-05 DIAGNOSIS — R0602 Shortness of breath: Secondary | ICD-10-CM | POA: Diagnosis not present

## 2014-03-05 DIAGNOSIS — R0609 Other forms of dyspnea: Secondary | ICD-10-CM | POA: Insufficient documentation

## 2014-03-05 MED ORDER — REGADENOSON 0.4 MG/5ML IV SOLN
0.4000 mg | Freq: Once | INTRAVENOUS | Status: AC
  Administered 2014-03-05: 0.4 mg via INTRAVENOUS

## 2014-03-05 MED ORDER — TECHNETIUM TC 99M SESTAMIBI GENERIC - CARDIOLITE
32.0000 | Freq: Once | INTRAVENOUS | Status: AC | PRN
  Administered 2014-03-05: 32 via INTRAVENOUS

## 2014-03-05 MED ORDER — TECHNETIUM TC 99M SESTAMIBI GENERIC - CARDIOLITE
10.6000 | Freq: Once | INTRAVENOUS | Status: AC | PRN
  Administered 2014-03-05: 11 via INTRAVENOUS

## 2014-03-05 NOTE — Procedures (Addendum)
[facility] [address] [place] [address] [phone]  Cardiology Nuclear Med Study  [name] is a 68 y.o. male     MRN : [id number]     DOB: 12/08/1945  Procedure Date: 03/05/2014  Nuclear Med Background Indication for Stress Test:  Evaluation for Ischemia and Follow up CAD History:  CAD; Last NUC MPI on 11/08/2010;Pulminary sarcoidosis;CHF;MI Cardiac Risk Factors: Family History - CAD, Hypertension, Lipids and Obesity  Symptoms:  Chest Pain, Dizziness, DOE, Fatigue, Light-Headedness, Nausea and SOB   Nuclear Pre-Procedure Caffeine/Decaff Intake:  7:00pm NPO After: 5:00am   IV Site: R Forearm  IV 0.9% NS with Angio Cath:  22g  Chest Size (in):  52"  IV Started by: Rolene Course, RN  Height: 5\' 10"  (1.778 m)  Cup Size: n/a  BMI:  Body mass index is 38.02 kg/(m^2). Weight:  265 lb (120.203 kg)   Tech Comments:  n/a    Nuclear Med Study 1 or 2 day study: 1 day  Stress Test Type:  Gleason Provider:  M. Skains, MD   Resting Radionuclide: Technetium 77m Sestamibi  Resting Radionuclide Dose: 10.6 mCi   Stress Radionuclide:  Technetium 68m Sestamibi  Stress Radionuclide Dose: 32.0 mCi           Stress Protocol Rest HR: 65 Stress HR: 75  Rest BP: 129/60 Stress BP: 120/60  Exercise Time (min): n/a METS: n/a   Predicted Max HR: 152 bpm % Max HR: 49.34 bpm Rate Pressure Product: 10725  Dose of Adenosine (mg):  n/a Dose of Lexiscan: 0.4 mg  Dose of Atropine (mg): n/a Dose of Dobutamine: n/a mcg/kg/min (at max HR)  Stress Test Technologist: Leane Para, CCT Nuclear Technologist: Imagene Riches, CNMT   Rest Procedure:  Myocardial perfusion imaging was performed at rest 45 minutes following the intravenous administration of Technetium 8m Sestamibi. Stress Procedure:  The patient received IV Lexiscan 0.4 mg over 15-seconds.  Technetium 68m Sestamibi injected IV at 30-seconds.  There were no  significant changes with Lexiscan.  Quantitative spect images were obtained after a 45 minute delay.  Transient Ischemic Dilatation (Normal <1.22):  1.21  QGS EDV:  102 ml QGS ESV:  42 ml LV Ejection Fraction: 59%       Rest ECG: NSR - Normal EKG  Stress ECG: No significant change from baseline ECG  QPS Raw Data Images:  There is interference from nuclear activity from structures below the diaphragm. This does not affect the ability to read the study. Stress Images:  There is decreased uptake in the anterior wall. Rest Images:  Normal homogeneous uptake in all areas of the myocardium. Subtraction (SDS):  These findings are consistent with ischemia.  Impression Exercise Capacity:  Lexiscan with no exercise. BP Response:  Normal blood pressure response. Clinical Symptoms:  No significant symptoms noted. ECG Impression:  No significant ST segment change suggestive of ischemia. Comparison with Prior Nuclear Study: Since previous study there is new anteroseptal and inferior ischemia  Overall Impression:  Intermediate risk stress nuclear study Mild anteroseptal and inferior ischemia. (2 vascular distributions).  LV Wall Motion:  NL LV Function; NL Wall Motion   Lorretta Harp, MD  03/05/2014 12:55 PM

## 2014-03-11 ENCOUNTER — Encounter (HOSPITAL_COMMUNITY): Payer: BC Managed Care – PPO

## 2014-03-11 ENCOUNTER — Other Ambulatory Visit (HOSPITAL_COMMUNITY): Payer: BC Managed Care – PPO

## 2014-03-12 ENCOUNTER — Telehealth: Payer: Self-pay | Admitting: Cardiology

## 2014-03-12 NOTE — Telephone Encounter (Signed)
Let's give the bisoprolol some time. If symptoms do not improve, we can always move forward with cardiac catheterization.  Craig Furbish, MD

## 2014-03-12 NOTE — Telephone Encounter (Signed)
New message     Want stress test and echo results

## 2014-03-12 NOTE — Telephone Encounter (Signed)
Reviewed results with pt who states his s/s are unchanged and he is still having tightness and SOB.  Aware I will forward information to Dr Marlou Porch for further orders.

## 2014-03-14 ENCOUNTER — Telehealth: Payer: Self-pay | Admitting: Cardiology

## 2014-03-14 NOTE — Telephone Encounter (Signed)
Left message for pt to call back to discuss how he is feeling.  Talked to Dr Marlou Porch - if pt is still having chest tightness and/or SOB he can be scheduled for a diagnostic cardiac cath.

## 2014-03-14 NOTE — Telephone Encounter (Signed)
Discussed s/s with pt who states he is not feeling any better at this point but does not feel like his s/s are coming from his lungs.  Advised pt I did discuss with Dr Marlou Porch who states we can go ahead an schedule him for a cath.  Pt states he would like to wait a week or two to see if any improvements.  He will call back to follow up.

## 2014-03-14 NOTE — Telephone Encounter (Signed)
New Msg         Pt returning call.  Pt requesting to be called about 3:15pm today.

## 2014-03-14 NOTE — Telephone Encounter (Signed)
Please see prior phone note for documentation

## 2014-03-16 ENCOUNTER — Other Ambulatory Visit: Payer: Self-pay | Admitting: Critical Care Medicine

## 2014-03-18 ENCOUNTER — Telehealth: Payer: Self-pay | Admitting: Cardiology

## 2014-03-18 DIAGNOSIS — Z01810 Encounter for preprocedural cardiovascular examination: Secondary | ICD-10-CM

## 2014-03-18 DIAGNOSIS — R079 Chest pain, unspecified: Secondary | ICD-10-CM

## 2014-03-18 NOTE — Telephone Encounter (Signed)
New Message        Pt calling stating that he needs to schedule a cath, please call pt back and advise.

## 2014-03-18 NOTE — Telephone Encounter (Signed)
Spoke with pt who states "I feel like crap".  He complaints that he becomes SOB and his heart rate speeds up with any activity and he feels like he did before when he had stenting.  Informed pt I will ask Dr Marlou Porch when he would like to schedule pt and call him back.  Pt states Thursdays are best for him but at this point he doesn't feel like he can be choosy.

## 2014-03-19 NOTE — Telephone Encounter (Signed)
Left message to call back to discuss scheduling.

## 2014-03-19 NOTE — Telephone Encounter (Signed)
Set up for cath. Wed 26th, 9:30 or after (have [place] rounds) Craig Furbish, MD

## 2014-03-21 ENCOUNTER — Encounter: Payer: Self-pay | Admitting: *Deleted

## 2014-03-21 NOTE — Telephone Encounter (Signed)
F/U ° ° ° ° ° ° ° ° ° °Pt returning call. Please call back.  °

## 2014-03-21 NOTE — Telephone Encounter (Signed)
Pt has been scheduled for a cardiac cath on 1/26 with Dr Marlou Porch.  Reviewed instructions and time.  Pt will come back for blood work 1/19 and will pick up his instruction sheet at the front desk.

## 2014-03-25 ENCOUNTER — Other Ambulatory Visit (INDEPENDENT_AMBULATORY_CARE_PROVIDER_SITE_OTHER): Payer: BLUE CROSS/BLUE SHIELD | Admitting: *Deleted

## 2014-03-25 DIAGNOSIS — Z01810 Encounter for preprocedural cardiovascular examination: Secondary | ICD-10-CM

## 2014-03-25 DIAGNOSIS — R079 Chest pain, unspecified: Secondary | ICD-10-CM

## 2014-03-25 LAB — BASIC METABOLIC PANEL
BUN: 21 mg/dL (ref 6–23)
CALCIUM: 8.8 mg/dL (ref 8.4–10.5)
CO2: 29 meq/L (ref 19–32)
Chloride: 102 mEq/L (ref 96–112)
Creatinine, Ser: 1.25 mg/dL (ref 0.40–1.50)
GFR: 60.99 mL/min (ref >60.00)
Glucose, Bld: 309 mg/dL — ABNORMAL HIGH (ref 70–99)
Potassium: 4 mEq/L (ref 3.5–5.1)
Sodium: 136 mEq/L (ref 135–145)

## 2014-03-25 LAB — CBC
HCT: 38.8 % — ABNORMAL LOW (ref 39.0–52.0)
Hemoglobin: 13.1 g/dL (ref 13.0–17.0)
MCHC: 33.7 g/dL (ref 30.0–36.0)
MCV: 100.1 fl — ABNORMAL HIGH (ref 78.0–100.0)
Platelets: 283 10*3/uL (ref 150.0–400.0)
RBC: 3.88 Mil/uL — ABNORMAL LOW (ref 4.22–5.81)
RDW: 14.6 % (ref 11.5–15.5)
WBC: 12.3 10*3/uL — ABNORMAL HIGH (ref 4.0–10.5)

## 2014-03-25 LAB — PROTIME-INR
INR: 1 ratio (ref 0.8–1.0)
PROTHROMBIN TIME: 10.7 s (ref 9.6–13.1)

## 2014-03-26 ENCOUNTER — Other Ambulatory Visit: Payer: Self-pay | Admitting: Pulmonary Disease

## 2014-03-26 ENCOUNTER — Other Ambulatory Visit: Payer: Self-pay | Admitting: Critical Care Medicine

## 2014-04-01 ENCOUNTER — Encounter (HOSPITAL_COMMUNITY)
Admission: RE | Disposition: A | Discharge status: Home / Self Care | Payer: Self-pay | Source: Ambulatory Visit | Attending: Cardiology

## 2014-04-01 ENCOUNTER — Encounter (HOSPITAL_COMMUNITY): Payer: Self-pay | Admitting: *Deleted

## 2014-04-01 ENCOUNTER — Ambulatory Visit (HOSPITAL_COMMUNITY)
Admission: RE | Admit: 2014-04-01 | Discharge: 2014-04-01 | Disposition: A | Discharge status: Home / Self Care | Payer: BLUE CROSS/BLUE SHIELD | Source: Ambulatory Visit | Attending: Cardiology | Admitting: Cardiology

## 2014-04-01 DIAGNOSIS — Z833 Family history of diabetes mellitus: Secondary | ICD-10-CM | POA: Diagnosis not present

## 2014-04-01 DIAGNOSIS — I25110 Atherosclerotic heart disease of native coronary artery with unstable angina pectoris: Secondary | ICD-10-CM

## 2014-04-01 DIAGNOSIS — Z794 Long term (current) use of insulin: Secondary | ICD-10-CM | POA: Diagnosis not present

## 2014-04-01 DIAGNOSIS — Z79899 Other long term (current) drug therapy: Secondary | ICD-10-CM | POA: Insufficient documentation

## 2014-04-01 DIAGNOSIS — Z8701 Personal history of pneumonia (recurrent): Secondary | ICD-10-CM | POA: Insufficient documentation

## 2014-04-01 DIAGNOSIS — Z7952 Long term (current) use of systemic steroids: Secondary | ICD-10-CM | POA: Insufficient documentation

## 2014-04-01 DIAGNOSIS — E785 Hyperlipidemia, unspecified: Secondary | ICD-10-CM | POA: Insufficient documentation

## 2014-04-01 DIAGNOSIS — I252 Old myocardial infarction: Secondary | ICD-10-CM | POA: Insufficient documentation

## 2014-04-01 DIAGNOSIS — G4733 Obstructive sleep apnea (adult) (pediatric): Secondary | ICD-10-CM | POA: Insufficient documentation

## 2014-04-01 DIAGNOSIS — I25119 Atherosclerotic heart disease of native coronary artery with unspecified angina pectoris: Secondary | ICD-10-CM | POA: Insufficient documentation

## 2014-04-01 DIAGNOSIS — E669 Obesity, unspecified: Secondary | ICD-10-CM | POA: Insufficient documentation

## 2014-04-01 DIAGNOSIS — I5032 Chronic diastolic (congestive) heart failure: Secondary | ICD-10-CM | POA: Insufficient documentation

## 2014-04-01 DIAGNOSIS — Z6837 Body mass index (BMI) 37.0-37.9, adult: Secondary | ICD-10-CM | POA: Diagnosis not present

## 2014-04-01 DIAGNOSIS — E1165 Type 2 diabetes mellitus with hyperglycemia: Secondary | ICD-10-CM | POA: Insufficient documentation

## 2014-04-01 DIAGNOSIS — D869 Sarcoidosis, unspecified: Secondary | ICD-10-CM | POA: Insufficient documentation

## 2014-04-01 DIAGNOSIS — Z955 Presence of coronary angioplasty implant and graft: Secondary | ICD-10-CM | POA: Insufficient documentation

## 2014-04-01 DIAGNOSIS — I10 Essential (primary) hypertension: Secondary | ICD-10-CM | POA: Diagnosis not present

## 2014-04-01 DIAGNOSIS — R079 Chest pain, unspecified: Secondary | ICD-10-CM | POA: Diagnosis present

## 2014-04-01 DIAGNOSIS — I2510 Atherosclerotic heart disease of native coronary artery without angina pectoris: Secondary | ICD-10-CM | POA: Diagnosis present

## 2014-04-01 DIAGNOSIS — Z7982 Long term (current) use of aspirin: Secondary | ICD-10-CM | POA: Insufficient documentation

## 2014-04-01 DIAGNOSIS — E8770 Fluid overload, unspecified: Secondary | ICD-10-CM | POA: Insufficient documentation

## 2014-04-01 HISTORY — PX: LEFT HEART CATHETERIZATION WITH CORONARY ANGIOGRAM: SHX5451

## 2014-04-01 LAB — GLUCOSE, CAPILLARY
GLUCOSE-CAPILLARY: 91 mg/dL (ref 70–99)
GLUCOSE-CAPILLARY: 69 mg/dL — AB (ref 70–99)
GLUCOSE-CAPILLARY: 113 mg/dL — AB (ref 70–99)

## 2014-04-01 SURGERY — LEFT HEART CATHETERIZATION WITH CORONARY ANGIOGRAM
Anesthesia: LOCAL

## 2014-04-01 MED ORDER — ASPIRIN 81 MG PO CHEW
81.0000 mg | CHEWABLE_TABLET | ORAL | Status: DC
Start: 2014-04-01 — End: 2014-04-01

## 2014-04-01 MED ORDER — SODIUM CHLORIDE 0.9 % IV SOLN: 250.0000 mL | INTRAVENOUS | Status: DC | PRN

## 2014-04-01 MED ORDER — FENTANYL CITRATE 0.05 MG/ML IJ SOLN
INTRAMUSCULAR | Status: AC
  Filled 2014-04-01: qty 2

## 2014-04-01 MED ORDER — VERAPAMIL HCL 2.5 MG/ML IV SOLN
INTRAVENOUS | Status: AC
  Filled 2014-04-01: qty 2

## 2014-04-01 MED ORDER — MIDAZOLAM HCL 2 MG/2ML IJ SOLN
INTRAMUSCULAR | Status: AC
  Filled 2014-04-01: qty 2

## 2014-04-01 MED ORDER — HEPARIN SODIUM (PORCINE) 1000 UNIT/ML IJ SOLN
INTRAMUSCULAR | Status: AC
  Filled 2014-04-01: qty 1

## 2014-04-01 MED ORDER — SODIUM CHLORIDE 0.9 % IJ SOLN: 3.0000 mL | INTRAMUSCULAR | Status: DC | PRN

## 2014-04-01 MED ORDER — HEPARIN (PORCINE) IN NACL 2-0.9 UNIT/ML-% IJ SOLN
INTRAMUSCULAR | Status: AC
  Filled 2014-04-01: qty 1000

## 2014-04-01 MED ORDER — NITROGLYCERIN 1 MG/10 ML FOR IR/CATH LAB
INTRA_ARTERIAL | Status: AC
  Filled 2014-04-01: qty 10

## 2014-04-01 MED ORDER — SODIUM CHLORIDE 0.9 % IV SOLN: 1.0000 mL/kg/h | INTRAVENOUS | Status: DC

## 2014-04-01 MED ORDER — SODIUM CHLORIDE 0.9 % IV SOLN
INTRAVENOUS | Status: DC
  Administered 2014-04-01: 10:00:00 via INTRAVENOUS

## 2014-04-01 MED ORDER — SODIUM CHLORIDE 0.9 % IJ SOLN: 3.0000 mL | Freq: Two times a day (BID) | INTRAMUSCULAR | Status: DC

## 2014-04-01 MED ORDER — LIDOCAINE HCL (PF) 1 % IJ SOLN
INTRAMUSCULAR | Status: AC
  Filled 2014-04-01: qty 30

## 2014-04-01 NOTE — CV Procedure (Signed)
    CARDIAC CATHETERIZATION  PROCEDURE:  Left heart catheterization with selective coronary angiography, left ventriculogram via the radial artery approach.  INDICATIONS:  Persistent angina, shortness of breath despite low risk nuclear stress test. Uncontrolled diabetes. Obesity. Chronic prednisone use.  The risks, benefits, and details of the procedure were explained to the patient, including possibilities of stroke, heart attack, death, renal impairment, arterial damage, bleeding.  The patient verbalized understanding and wanted to proceed.  Informed written consent was obtained.  PROCEDURE TECHNIQUE:  Allen's test was performed pre-and post procedure and was normal. The right radial artery site was prepped and draped in a sterile fashion. One percent lidocaine was used for local anesthesia. Using the modified Seldinger technique a 5 French hydrophilic sheath was inserted into the radial artery without difficulty. 3 mg of verapamil was administered via the sheath. A Judkins right #4 catheter with the guidance of a Versicore wire was placed in the right coronary cusp and selectively cannulated the right coronary artery. After traversing the aortic arch, 5000 units of heparin IV was administered. A Judkins left #3.5 catheter was used to selectively cannulate the left main artery. Multiple views with hand injection of Omnipaque were obtained. Catheter a pigtail catheter was used to cross into the left ventricle, hemodynamics were obtained, and a left ventriculogram was performed in the RAO position with power injection. Following the procedure, sheath was removed, patient was hemodynamically stable, hemostasis was maintained with a Terumo T band.   CONTRAST:  Total of 70 ml.    COMPLICATIONS:  None.    HEMODYNAMICS:  Aortic pressure was 856/31SHFW; LV systolic pressure was 263ZCHY; LVEDP 51mmHg.  There was no gradient between the left ventricle and aorta.    ANGIOGRAPHIC DATA:    Left main: Widely  patent.  Left anterior descending (LAD): Previously placed LAD stent is widely patent. There is mild post stent stenosis just beyond the first septal branch. The distal LAD diffusely narrowed, diabetic in appearance. No flow-limiting disease.  Circumflex artery (CIRC):  native AV groove circumflex is moderately diffusely diseased. Small caliber vessel. No area amenable to stenting.  Right coronary artery (RCA):  previous he placed posterior lateral stent is widely patent. Mild luminal irregularities throughout this dominant vessel.  LEFT VENTRICULOGRAM:  Left ventricular angiogram was done in the 30 RAO projection and revealed normal left ventricular wall motion and systolic function with an estimated ejection fraction of 60%.   IMPRESSIONS:   previously placed LAD and posterior lateral stents are widely patent. There is mild stenosis distal to LAD stent with moderate diffuse disease in the small caliber distal LAD segment. There is also moderate disease in the AV groove circumflex of 50%. Does not appear to be flow-limiting. Regardless, circumflex artery is small in caliber.  Normal left ventricular systolic function.  LVEDP 15 mmHg.  Ejection fraction 60%.  RECOMMENDATION:   Continue with medical management. His main complaint is continued shortness of breath. This is multifactorial from underlying pulmonary disease, morbid obesity, diabetes, chronic prednisone use leading to fluid overload. He is also complaining of fluid in his legs. Also secondary to chronic prednisone and morbid obesity. Continue with medical management. Films were reviewed with interventional cardiology.  Candee Furbish, MD

## 2014-04-01 NOTE — Progress Notes (Signed)
Glucose finger stick 69, pt is now eating and drinking, denies any symptoms.

## 2014-04-01 NOTE — H&P (View-Only) (Signed)
[facility]. [address], [place]  [address] Phone: [phone] Fax:  [phone]  Date:  03/03/2014   ID:  Craig, [name] February 17, 1946, MRN [id number]  PCP:  Shirline Frees, MD   History of Present Illness: [name] is a 69 y.o. male with coronary artery disease, drug eluting stent to LAD and posterior lateral branch 10/2009, cholecystectomy November 2012, history of pulmonary sarcoid. Cardiac catheterization 10/12/11 that revealed patent LAD, small vessels with normal left ventricular function of 55%, increased left ventricular end-diastolic pressure. He has had chronic symptoms of shortness of breath,  decreased endurance which has recently worsened. He is also been feelings diaphoresis. Checked blood sugars at the time which were normal. Recently had his prednisone increased. No evidence of increase sarcoidosis on CT scan. Difficult diabetes. Dr. Joya Gaskins on chronic prednisone therapy which seemed to help with lower extremity discomfort.  Wife surgery stroke after neck surgery. Daughter killed in tractor accident.  He has been out of isosorbide. In the past, not able to tolerate beta blockers. We will try again.   Wt Readings from Last 3 Encounters:  03/03/14 265 lb (120.203 kg)  02/20/14 264 lb (119.75 kg)  02/06/14 266 lb (120.657 kg)     Past Medical History  Diagnosis Date  . Type 2 diabetes mellitus   . Sarcoid   . Adrenal insufficiency   . Hypertension   . OSA (obstructive sleep apnea)     uses VPAC sleep study 2 years done through [facility]. Dr. Marlou Porch arranged study  . Coronary artery disease     has stents  . Heart attack 2010  . Pneumonia     3-4 years ago  . Diabetes mellitus     insulin and pills  . Coronary atherosclerosis of native coronary artery     Proximal LAD, posterior lateral stent widely patent-10/12/11    Past Surgical History  Procedure Laterality Date  . Coronary stent placement  2009    in LAD and side branch PTCA  . Cataract  extraction  2011    bilat  . Intercostal nerve block  2011    x2. lumbar spine  . Appendectomy    . Coronary angioplasty      most recent 11/2009  . Cholecystectomy  01/25/2011    Procedure: LAPAROSCOPIC CHOLECYSTECTOMY WITH INTRAOPERATIVE CHOLANGIOGRAM;  Surgeon: Judieth Keens, DO;  Location: [facility];  Service: General;  Laterality: N/A;  . Left heart catheterization with coronary angiogram Bilateral 10/12/2011    Procedure: LEFT HEART CATHETERIZATION WITH CORONARY ANGIOGRAM;  Surgeon: Candee Furbish, MD;  Location: [facility] CATH LAB;  Service: Cardiovascular;  Laterality: Bilateral;    Current Outpatient Prescriptions  Medication Sig Dispense Refill  . aspirin 81 MG tablet Take 81 mg by mouth daily.      . bisoprolol (ZEBETA) 5 MG tablet Take 1 tablet (5 mg total) by mouth daily. 90 tablet 3  . clopidogrel (PLAVIX) 75 MG tablet TAKE 1 TABLET EVERY DAY 90 tablet 4  . COENZYME Q-10 PO Take 1 tablet by mouth daily.      . fish oil-omega-3 fatty acids 1000 MG capsule Take 1 g by mouth daily.     . furosemide (LASIX) 40 MG tablet Take 2 tablets in AM and 2 PM daily 100 tablet 0  . glipiZIDE (GLUCOTROL) 10 MG tablet Take 20 mg by mouth 2 (two) times daily.      . insulin aspart protamine-insulin aspart (NOVOLOG 70/30) (70-30)  100 UNIT/ML injection Inject 45-55 Units into the skin 3 (three) times daily with meals. Takes 3 times a day with a meal.  If blood sugar <150, then uses 45 units.  If blood sugar greater than or equal 200 uses 55 units     . isosorbide mononitrate (IMDUR) 60 MG 24 hr tablet Take 1 tablet (60 mg total) by mouth daily. 90 tablet 3  . losartan (COZAAR) 100 MG tablet TAKE 1 TABLET BY MOUTH EVERY DAY 90 tablet 1  . mometasone (NASONEX) 50 MCG/ACT nasal spray Place 2 sprays into the nose daily as needed.    . Multiple Vitamin (MULTIVITAMIN) capsule Take 1 capsule by mouth daily.      . potassium chloride (KLOR-CON M10) 10 MEQ tablet 2 AM and 2 PM 180 tablet 4  . predniSONE (DELTASONE)  10 MG tablet Take 3 per day x 4 days then 2 a day and stay 90 tablet 4  . Vitamin D, Ergocalciferol, (DRISDOL) 50000 UNITS CAPS capsule   1   No current facility-administered medications for this visit.    Allergies:    Allergies  Allergen Reactions  . Hydrocortisone Nausea Only  . Statins Other (See Comments)    Pt says they make him "mean"    Social History:  The patient  reports that he has never smoked. He has never used smokeless tobacco. He reports that he does not drink alcohol or use illicit drugs.   Family History  Problem Relation Age of Onset  . Asthma Sister   . Anesthesia problems Sister     "Kidney's did not wake up"  . Kidney disease Mother   . Diabetes Mother   . Kidney cancer Mother   . Cancer Mother     kidney  . Heart attack Father     ROS:  Please see the history of present illness.   Denies any syncope, bleeding, orthopnea, PND.   All other systems reviewed and negative.   PHYSICAL EXAM: VS:  BP 128/66 mmHg  Pulse 84  Ht 5\' 10"  (1.778 m)  Wt 265 lb (120.203 kg)  BMI 38.02 kg/m2 Well nourished, well developed, in no acute distress HEENT: normal, [place]/AT, EOMI Neck: no JVD, normal carotid upstroke, no bruit Cardiac:  normal S1, S2; RRR; no murmur Lungs:  clear to auscultation bilaterally, no wheezing, rhonchi or rales Abd: soft, nontender, no hepatomegaly, no bruitsobese Ext: no edema, 2+ distal pulses Skin: warm and drythickened GU: deferred Neuro: no focal abnormalities noted, AAO x 3  EKG:  03/03/14-sinus rhythm, LAFB, septal infarct, no ST segment changes, heart rate 84 bpm.  ASSESSMENT AND PLAN:  1. Chronic diastolic heart failure-in the past, he has not tolerated beta blockers. I will try bisoprolol 5 mg once a day. Continue with other medications. 2. Chronic stable angina/dyspnea-chest discomfort when exerting himself, rests, goes away. Feeling diaphoresis-and if this is side effect of increased prednisone, hard to tell. Last evaluation  2013 showed patent stents. I'm going to proceed with echocardiogram to ensure that he has normal EF and check nuclear stress test given his prior PCI's. Prior catheterization repeat as above. 3. Fluid overload-Dr. Joya Gaskins recently has increased his Lasix. I will check basic metabolic profile. I agree with this especially in light of his increased prednisone. 4. Obesity-encourage weight loss. 5. Diabetes-uncontrolled-previously A1c in the 9 range. Continue to work with Dr. Kenton Kingfisher. 6. Pulmonary sarcoid-Dr. Joya Gaskins. Prednisone. No recent increase on CT. 7. Hyperlipidemia-hypertriglyceridemia in the setting of uncontrolled diabetes. This will be  challenging control as long as his lipids are elevated. 8. We will see back in 2 months.  Signed, Candee Furbish, MD [facility]  03/03/2014 3:33 PM    .

## 2014-04-01 NOTE — Progress Notes (Signed)
Sodium reduction pamphlet given, discussed fluid reduction to 1 liter daily, reviewed hidden sodium foods. Pt and family verbalized understanding and was accepting of teaching materials.

## 2014-04-01 NOTE — Interval H&P Note (Signed)
History and Physical Interval Note:  04/01/2014 10:20 AM  [name]  has presented today for surgery, with the diagnosis of cp  The various methods of treatment have been discussed with the patient and family. After consideration of risks, benefits and other options for treatment, the patient has consented to  Procedure(s): LEFT HEART CATHETERIZATION WITH CORONARY ANGIOGRAM (N/A) as a surgical intervention .  The patient's history has been reviewed, patient examined, no change in status, stable for surgery.  I have reviewed the patient's chart and labs.  Questions were answered to the patient's satisfaction.     SKAINS, MARK

## 2014-04-01 NOTE — Discharge Instructions (Signed)
INCREASE LASIX TO 160mg  in the morning and 80 mg in evening   Refer to this sheet in the next few weeks. These instructions provide you with information on caring for yourself after your procedure. Your caregiver may also give you more specific instructions. Your treatment has been planned according to current medical practices, but problems sometimes occur. Call your caregiver if you have any problems or questions after your procedure. HOME CARE INSTRUCTIONS  You may shower the day after the procedure.Remove the bandage (dressing) and gently wash the site with plain soap and water.Gently pat the site dry.  Do not apply powder or lotion to the site.  Do not submerge the affected site in water for 3 to 5 days.  Inspect the site at least twice daily.  Do not flex or bend the affected arm for 24 hours.  No lifting over 5 pounds (2.3 kg) for 5 days after your procedure.  Do not drive home if you are discharged the same day of the procedure. Have someone else drive you.  You may drive 24 hours after the procedure unless otherwise instructed by your caregiver.  Do not operate machinery or power tools for 24 hours.  A responsible adult should be with you for the first 24 hours after you arrive home. What to expect:  Any bruising will usually fade within 1 to 2 weeks.  Blood that collects in the tissue (hematoma) may be painful to the touch. It should usually decrease in size and tenderness within 1 to 2 weeks. SEEK IMMEDIATE MEDICAL CARE IF:  You have unusual pain at the radial site.  You have redness, warmth, swelling, or pain at the radial site.  You have drainage (other than a small amount of blood on the dressing).  You have chills.  You have a fever or persistent symptoms for more than 72 hours.  You have a fever and your symptoms suddenly get worse.  Your arm becomes pale, cool, tingly, or numb.  You have heavy bleeding from the site. Hold pressure on the site. Call  911 Document Released: 03/26/2010 Document Revised: 05/16/2011 Document Reviewed: 03/26/2010 [facility] Patient Information 2015 [place], [place]. This information is not intended to replace advice given to you by your health care provider. Make sure you discuss any questions you have with your health care provider.

## 2014-04-14 ENCOUNTER — Other Ambulatory Visit: Payer: Self-pay | Admitting: Critical Care Medicine

## 2014-04-14 ENCOUNTER — Other Ambulatory Visit: Payer: Self-pay | Admitting: Pulmonary Disease

## 2014-04-17 ENCOUNTER — Other Ambulatory Visit: Payer: Self-pay

## 2014-04-17 MED ORDER — CLOPIDOGREL BISULFATE 75 MG PO TABS: 75.0000 mg | ORAL_TABLET | Freq: Every day | ORAL | Status: DC

## 2014-04-25 ENCOUNTER — Other Ambulatory Visit: Payer: Self-pay | Admitting: Critical Care Medicine

## 2014-05-12 ENCOUNTER — Ambulatory Visit: Payer: BC Managed Care – PPO | Admitting: Cardiology

## 2014-05-20 ENCOUNTER — Encounter: Payer: Self-pay | Admitting: Cardiology

## 2014-05-24 ENCOUNTER — Other Ambulatory Visit: Payer: Self-pay | Admitting: Critical Care Medicine

## 2014-05-27 ENCOUNTER — Telehealth: Payer: Self-pay | Admitting: Critical Care Medicine

## 2014-05-27 MED ORDER — LEVOFLOXACIN 750 MG PO TABS: 750.0000 mg | ORAL_TABLET | Freq: Every day | ORAL | Status: DC

## 2014-05-27 NOTE — Telephone Encounter (Signed)
When did she get leavquin ?

## 2014-05-27 NOTE — Telephone Encounter (Signed)
Will forward to MW as RA is unavailable.   MW please advise.

## 2014-05-27 NOTE — Telephone Encounter (Signed)
Ok rx with levaquin 750 x 5 days

## 2014-05-27 NOTE — Telephone Encounter (Signed)
Received refill request for Levaquin 750 mg qd x 5 days from pharmacy. Called, spoke with pt who c/o HA, sinus pressure, feels swollen around eyes, nasal congestion and cough with yellowish green mucus, night sweats, wheezing qhs, and chest tightness x 4 days.  Using Allegra and Mucinex for symptoms.  Requesting abx -- offered OV; pt declined at this time d/t work schedule.  Requesting rx be sent in if possible.  Dr. Joya Gaskins off - Dr. Elsworth Soho, please advise.  Thank you.   [facility] [place]

## 2014-05-27 NOTE — Telephone Encounter (Signed)
Pt aware RX has been called in. Nothing further needed 

## 2014-05-27 NOTE — Telephone Encounter (Signed)
I called spoke with pt. He had levaquin back in November 2015. Please advise thanks

## 2014-05-28 NOTE — Telephone Encounter (Signed)
See phone msg from 05/28/14 regarding levaquin rx

## 2014-07-08 ENCOUNTER — Other Ambulatory Visit: Payer: Self-pay | Admitting: Critical Care Medicine

## 2014-07-24 ENCOUNTER — Ambulatory Visit (INDEPENDENT_AMBULATORY_CARE_PROVIDER_SITE_OTHER): Payer: BLUE CROSS/BLUE SHIELD | Admitting: Critical Care Medicine

## 2014-07-24 ENCOUNTER — Encounter: Payer: Self-pay | Admitting: Critical Care Medicine

## 2014-07-24 VITALS — BP 118/62 | HR 65 | Temp 97.1°F | Ht 70.0 in | Wt 238.0 lb

## 2014-07-24 DIAGNOSIS — D860 Sarcoidosis of lung: Secondary | ICD-10-CM

## 2014-07-24 MED ORDER — RABEPRAZOLE SODIUM 20 MG PO TBEC: 20.0000 mg | DELAYED_RELEASE_TABLET | Freq: Two times a day (BID) | ORAL | Status: DC | PRN

## 2014-07-24 MED ORDER — ALBUTEROL SULFATE 108 (90 BASE) MCG/ACT IN AEPB: 2.0000 | INHALATION_SPRAY | Freq: Four times a day (QID) | RESPIRATORY_TRACT | Status: DC | PRN

## 2014-07-24 MED ORDER — ALBUTEROL SULFATE (2.5 MG/3ML) 0.083% IN NEBU: 2.5000 mg | INHALATION_SOLUTION | Freq: Four times a day (QID) | RESPIRATORY_TRACT | Status: DC | PRN

## 2014-07-24 MED ORDER — PREDNISONE 10 MG PO TABS: 10.0000 mg | ORAL_TABLET | Freq: Every day | ORAL | Status: DC

## 2014-07-24 MED ORDER — MOMETASONE FUROATE 50 MCG/ACT NA SUSP: 2.0000 | Freq: Every day | NASAL | Status: DC | PRN

## 2014-07-24 NOTE — Assessment & Plan Note (Addendum)
Steroid dep sarcoidosis, stable at present Plan Cont prednisone as rx No other changes

## 2014-07-24 NOTE — Progress Notes (Signed)
Subjective:    Patient ID: [name], male    DOB: [birth year], 69 y.o.   MRN: [id number]  HPI 07/24/2014 Chief Complaint  Patient presents with  . Follow-up    breathing doing okay, just constantly fighting sinus congestion, tightness in chest, coughing up greenish phlegm sometimes has blood in it, woke up last friday (5/13) with nose bleed.     Still with sinus issues, tightness in chest, some green in mucus mixed with blood.  Episode of epistaxis 07/18/14, stopped on its own Using cpap.  HgbA1C 7.0 now weight is down Pt denies any significant sore throat, fever, chills, sweats, unintended weight loss, pleurtic or exertional chest pain, orthopnea PND,   ++ leg swelling Pt denies any increase in rescue therapy over baseline, denies waking up needing it or having any early am or nocturnal exacerbations of coughing/wheezing/or dyspnea. Pt also denies any obvious fluctuation in symptoms with  weather or environmental change or other alleviating or aggravating factors   Current Medications, Allergies, Complete Past Medical History, Past Surgical History, Family History, and Social History were reviewed in [employer] record.  Past Medical History  Diagnosis Date  . Type 2 diabetes mellitus   . Sarcoid   . Adrenal insufficiency   . Hypertension   . OSA (obstructive sleep apnea)     uses VPAC sleep study 2 years done through [facility]. Dr. Marlou Porch arranged study  . Coronary artery disease     has stents  . Heart attack 2010  . Pneumonia     3-4 years ago  . Diabetes mellitus     insulin and pills  . Coronary atherosclerosis of native coronary artery     Proximal LAD, posterior lateral stent widely patent-10/12/11     Family History  Problem Relation Age of Onset  . Asthma Sister   . Anesthesia problems Sister     "Kidney's did not wake up"  . Kidney disease Mother   . Diabetes Mother   . Kidney cancer Mother   . Cancer Mother     kidney  . Heart attack  Father      History   Social History  . Marital Status: Married    Spouse Name: N/A  . Number of Children: 3  . Years of Education: N/A   Occupational History  . 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 MANAGER    Social History Main Topics  . Smoking status: Never Smoker   . Smokeless tobacco: Never Used  . Alcohol Use: No  . Drug Use: No  . Sexual Activity: Not on file   Other Topics Concern  . Not on file   Social History Narrative     Allergies  Allergen Reactions  . Hydrocortisone Nausea Only  . Statins Other (See Comments)    Pt says they make him "mean"     Outpatient Prescriptions Prior to Visit  Medication Sig Dispense Refill  . acetaminophen (TYLENOL) 500 MG tablet Take 500 mg by mouth daily.    Marland Kitchen albuterol (ACCUNEB) 1.25 MG/3ML nebulizer solution Inhale 1 ampule into the lungs every 4 (four) hours as needed for wheezing or shortness of breath.   3  . albuterol (PROVENTIL HFA;VENTOLIN HFA) 108 (90 BASE) MCG/ACT inhaler Inhale 2 puffs into the lungs every 6 (six) hours as needed for wheezing or shortness of breath.    Marland Kitchen aspirin EC 81 MG tablet Take 81 mg by mouth daily.    . Cholecalciferol (  VITAMIN D3) 3000 UNITS TABS Take 3,000 Units by mouth daily.    . clopidogrel (PLAVIX) 75 MG tablet Take 1 tablet (75 mg total) by mouth daily. 90 tablet 4  . Coenzyme Q10 (COQ10 MAXIMUM STRENGTH PO) Take 1 tablet by mouth daily with lunch.    . fish oil-omega-3 fatty acids 1000 MG capsule Take 3 g by mouth daily with lunch.     . furosemide (LASIX) 40 MG tablet TAKE 2 TABLETS BY MOUTH EVERY MORNING AND 2 TABLETS EVERY EVENING 100 tablet 0  . glipiZIDE (GLUCOTROL) 10 MG tablet Take 20 mg by mouth 2 (two) times daily.      . insulin aspart protamine-insulin aspart (NOVOLOG 70/30) (70-30) 100 UNIT/ML injection Inject 40-50 Units into the skin 3 (three) times daily as needed (CBG >100). If blood sugar <100 0 units, 100-149 40 units, >150 units 50 units    . isosorbide  mononitrate (IMDUR) 60 MG 24 hr tablet Take 1 tablet (60 mg total) by mouth daily. 90 tablet 3  . losartan (COZAAR) 100 MG tablet TAKE 1 TABLET BY MOUTH EVERY DAY (Patient taking differently: Take 100 mg by mouth daily. ) 90 tablet 1  . mometasone (NASONEX) 50 MCG/ACT nasal spray Place 2 sprays into the nose daily as needed (congestion).     . potassium chloride (KLOR-CON M10) 10 MEQ tablet Take 2 tablets (20 mEq total) by mouth 2 (two) times daily. 360 tablet 1  . predniSONE (DELTASONE) 10 MG tablet Take 3 per day x 4 days then 2 a day and stay 90 tablet 4  . Vitamin D, Ergocalciferol, (DRISDOL) 50000 UNITS CAPS capsule Take 50,000 Units by mouth every 7 (seven) days. On Sundays  1  . bisoprolol (ZEBETA) 5 MG tablet Take 1 tablet (5 mg total) by mouth daily. (Patient not taking: Reported on 07/24/2014) 90 tablet 3  . furosemide (LASIX) 80 MG tablet Take 160 mg by mouth daily.   1  . levofloxacin (LEVAQUIN) 750 MG tablet Take 1 tablet (750 mg total) by mouth daily. (Patient not taking: Reported on 07/24/2014) 5 tablet 0   No facility-administered medications prior to visit.   Review of Systems  Constitutional: Negative for fever, chills, diaphoresis, appetite change, fatigue and unexpected weight change.  HENT: Negative for congestion, ear discharge, ear pain, hearing loss, nosebleeds, postnasal drip, rhinorrhea, sinus pressure, sneezing, sore throat, trouble swallowing and voice change.   Eyes: Negative for discharge and itching.  Respiratory: Positive for shortness of breath. Negative for apnea, cough, choking, chest tightness, wheezing and stridor.   Cardiovascular: Negative for chest pain, palpitations and leg swelling.  Gastrointestinal: Negative for nausea, vomiting, abdominal pain and abdominal distention.  Endocrine: Negative.   Genitourinary: Negative.   Musculoskeletal: Negative for myalgias, joint swelling and arthralgias.  Skin: Negative for rash.  Allergic/Immunologic: Negative for  environmental allergies.  Neurological: Negative for dizziness, syncope, weakness and headaches.  Hematological: Negative for adenopathy. Does not bruise/bleed easily.  Psychiatric/Behavioral: Negative for sleep disturbance and agitation. The patient is not nervous/anxious.        Objective:   Physical Exam  Constitutional: He is oriented to person, place, and time. He appears well-developed and well-nourished. He is active.  HENT:  Head: Normocephalic and atraumatic.  Nose: No mucosal edema, rhinorrhea, sinus tenderness, nasal deformity or septal deviation. No epistaxis. Right sinus exhibits no maxillary sinus tenderness and no frontal sinus tenderness. Left sinus exhibits no maxillary sinus tenderness and no frontal sinus tenderness.  Mouth/Throat: Oropharynx is clear  and moist. No oropharyngeal exudate.  Eyes: Conjunctivae and EOM are normal. Pupils are equal, round, and reactive to light. No scleral icterus.  Neck: Trachea normal and normal range of motion. Neck supple. No JVD present. No tracheal tenderness and no muscular tenderness present. Carotid bruit is not present. No rigidity. No tracheal deviation, no edema, no erythema and normal range of motion present. No thyromegaly present.  Cardiovascular: Normal rate, regular rhythm, S1 normal, S2 normal, normal heart sounds, intact distal pulses and normal pulses.  PMI is not displaced.  Exam reveals no gallop, no S3, no S4, no distant heart sounds and no friction rub.   No murmur heard.  No systolic murmur is present   No diastolic murmur is present  Pulmonary/Chest: No accessory muscle usage or stridor. No apnea and no tachypnea. No respiratory distress. He has no decreased breath sounds. He has no wheezes. He has no rhonchi. He has no rales. Chest wall is not dull to percussion. He exhibits no mass, no tenderness, no bony tenderness and no deformity.  Abdominal: Soft. Normal appearance and bowel sounds are normal. He exhibits no  distension and no ascites. There is no hepatosplenomegaly. There is no tenderness. There is no rigidity, no rebound and no guarding.  Musculoskeletal: Normal range of motion.  Lymphadenopathy:       Head (right side): No submental and no submandibular adenopathy present.       Head (left side): No submental and no submandibular adenopathy present.    He has no cervical adenopathy.  Neurological: He is alert and oriented to person, place, and time. He has normal strength. No sensory deficit.  Skin: Skin is warm and dry. No rash noted. He is not diaphoretic. No pallor. Nails show no clubbing.  Psychiatric: He has a normal mood and affect. His speech is normal and behavior is normal.  Vitals reviewed.         Assessment & Plan:  I personally reviewed all images and lab data in the [facility] system as well as any outside material available during this office visit and agree with the  radiology impressions.  I also have reviewed any data /notes/records if available in care everywhere.  Pulmonary sarcoidosis Steroid dep sarcoidosis, stable at present Plan Cont prednisone as rx No other changes  GERD symptoms PPI  Jadarian was seen today for follow-up.  Diagnoses and all orders for this visit:  Pulmonary sarcoidosis  Other orders -     predniSONE (DELTASONE) 10 MG tablet; Take 1 tablet (10 mg total) by mouth daily with breakfast. T -     mometasone (NASONEX) 50 MCG/ACT nasal spray; Place 2 sprays into the nose daily as needed (congestion). -     RABEprazole (ACIPHEX) 20 MG tablet; Take 1 tablet (20 mg total) by mouth 2 (two) times daily as needed. -     Albuterol Sulfate (PROAIR RESPICLICK) 355 (90 BASE) MCG/ACT AEPB; Inhale 2 puffs into the lungs every 6 (six) hours as needed. -     albuterol (PROVENTIL) (2.5 MG/3ML) 0.083% nebulizer solution; Take 3 mLs (2.5 mg total) by nebulization every 6 (six) hours as needed for wheezing or shortness of breath. Dx Code D86.0    I h

## 2014-07-24 NOTE — Patient Instructions (Signed)
Refills send for medications Albuterol HFA changed to Albuterol Respiclick Follow up with Tammy, NP in 3 months

## 2014-07-31 ENCOUNTER — Other Ambulatory Visit: Payer: Self-pay | Admitting: Critical Care Medicine

## 2014-08-19 ENCOUNTER — Other Ambulatory Visit: Payer: Self-pay | Admitting: Critical Care Medicine

## 2014-09-11 ENCOUNTER — Other Ambulatory Visit: Payer: Self-pay | Admitting: Critical Care Medicine

## 2014-09-11 ENCOUNTER — Other Ambulatory Visit: Payer: Self-pay | Admitting: Physician Assistant

## 2014-09-18 ENCOUNTER — Encounter: Payer: Self-pay | Admitting: Gastroenterology

## 2014-09-30 ENCOUNTER — Other Ambulatory Visit: Payer: Self-pay | Admitting: Critical Care Medicine

## 2014-10-09 ENCOUNTER — Encounter (HOSPITAL_BASED_OUTPATIENT_CLINIC_OR_DEPARTMENT_OTHER): Payer: BLUE CROSS/BLUE SHIELD | Attending: Internal Medicine

## 2014-10-09 DIAGNOSIS — Z794 Long term (current) use of insulin: Secondary | ICD-10-CM | POA: Insufficient documentation

## 2014-10-09 DIAGNOSIS — L97521 Non-pressure chronic ulcer of other part of left foot limited to breakdown of skin: Secondary | ICD-10-CM | POA: Insufficient documentation

## 2014-10-09 DIAGNOSIS — I5032 Chronic diastolic (congestive) heart failure: Secondary | ICD-10-CM | POA: Insufficient documentation

## 2014-10-09 DIAGNOSIS — I110 Hypertensive heart disease with heart failure: Secondary | ICD-10-CM | POA: Insufficient documentation

## 2014-10-09 DIAGNOSIS — E11621 Type 2 diabetes mellitus with foot ulcer: Secondary | ICD-10-CM | POA: Insufficient documentation

## 2014-10-09 DIAGNOSIS — G4733 Obstructive sleep apnea (adult) (pediatric): Secondary | ICD-10-CM | POA: Insufficient documentation

## 2014-10-12 ENCOUNTER — Other Ambulatory Visit: Payer: Self-pay | Admitting: Critical Care Medicine

## 2014-10-16 ENCOUNTER — Ambulatory Visit (HOSPITAL_COMMUNITY)
Admission: RE | Admit: 2014-10-16 | Discharge: 2014-10-16 | Disposition: A | Discharge status: Home / Self Care | Payer: BLUE CROSS/BLUE SHIELD | Source: Ambulatory Visit | Attending: General Surgery | Admitting: General Surgery

## 2014-10-16 ENCOUNTER — Other Ambulatory Visit: Payer: Self-pay | Admitting: General Surgery

## 2014-10-16 DIAGNOSIS — G4733 Obstructive sleep apnea (adult) (pediatric): Secondary | ICD-10-CM | POA: Diagnosis not present

## 2014-10-16 DIAGNOSIS — M869 Osteomyelitis, unspecified: Secondary | ICD-10-CM

## 2014-10-16 DIAGNOSIS — L97529 Non-pressure chronic ulcer of other part of left foot with unspecified severity: Secondary | ICD-10-CM | POA: Insufficient documentation

## 2014-10-16 DIAGNOSIS — M898X7 Other specified disorders of bone, ankle and foot: Secondary | ICD-10-CM | POA: Diagnosis not present

## 2014-10-16 DIAGNOSIS — E11621 Type 2 diabetes mellitus with foot ulcer: Secondary | ICD-10-CM | POA: Diagnosis not present

## 2014-10-16 DIAGNOSIS — L97521 Non-pressure chronic ulcer of other part of left foot limited to breakdown of skin: Secondary | ICD-10-CM | POA: Diagnosis not present

## 2014-10-16 DIAGNOSIS — Z794 Long term (current) use of insulin: Secondary | ICD-10-CM | POA: Diagnosis not present

## 2014-10-16 IMAGING — CR DG FOOT COMPLETE 3+V*L*
3 series · 3 of 3 positions shown · non-contrast
Comparison: None.

CLINICAL DATA: Ulcer on second toe for 4 weeks.

EXAM:
LEFT FOOT - COMPLETE 3+ VIEW

[t foot ap left]
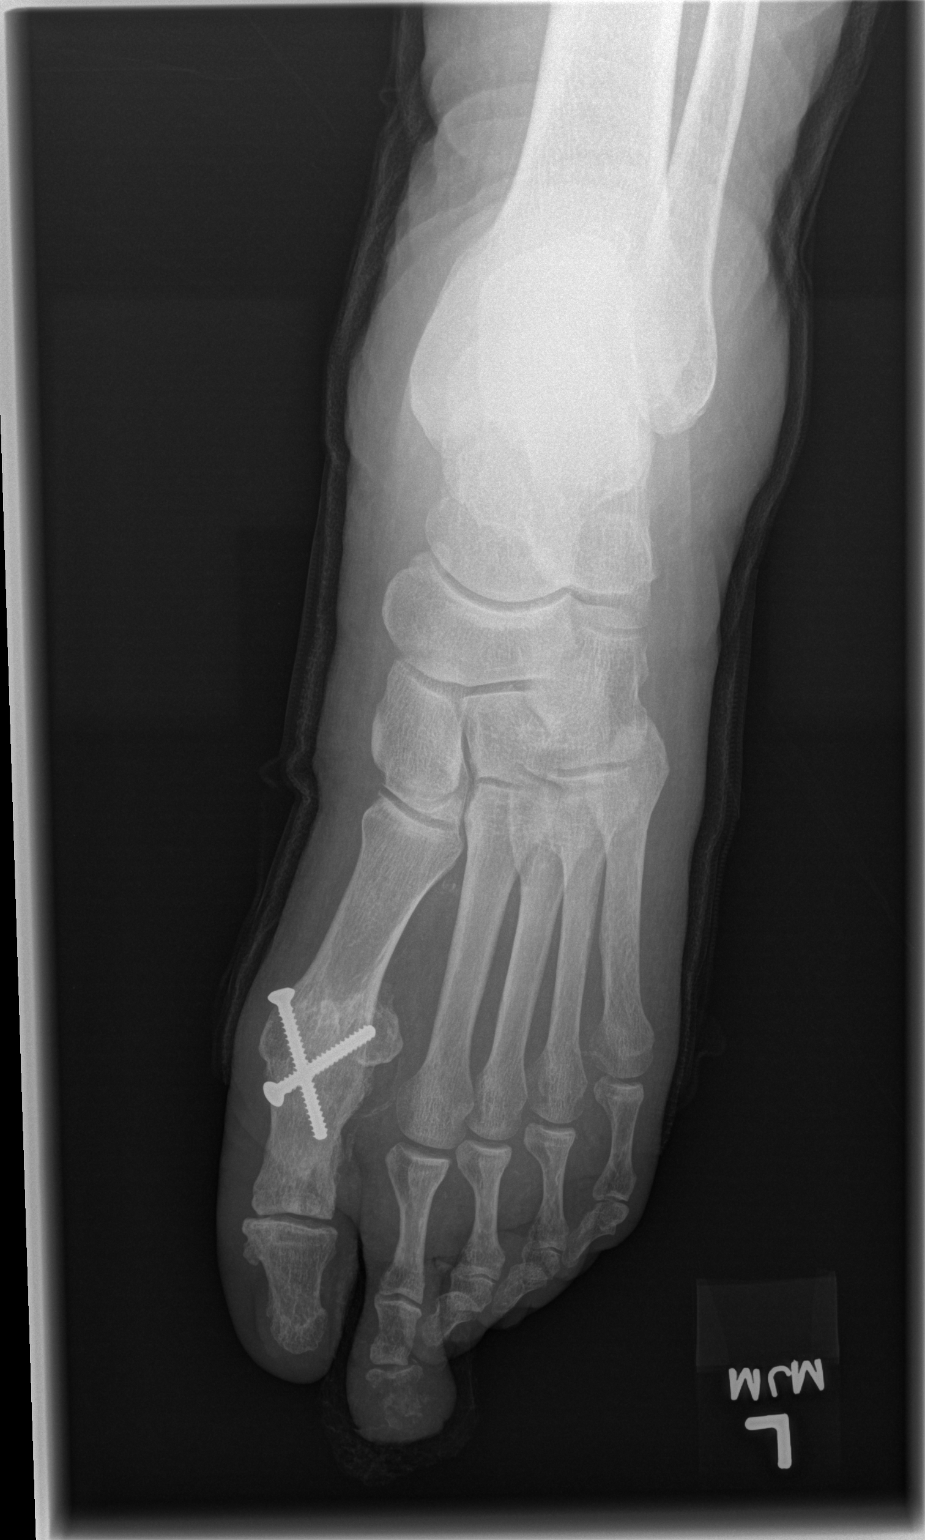

[t foot oblique left]
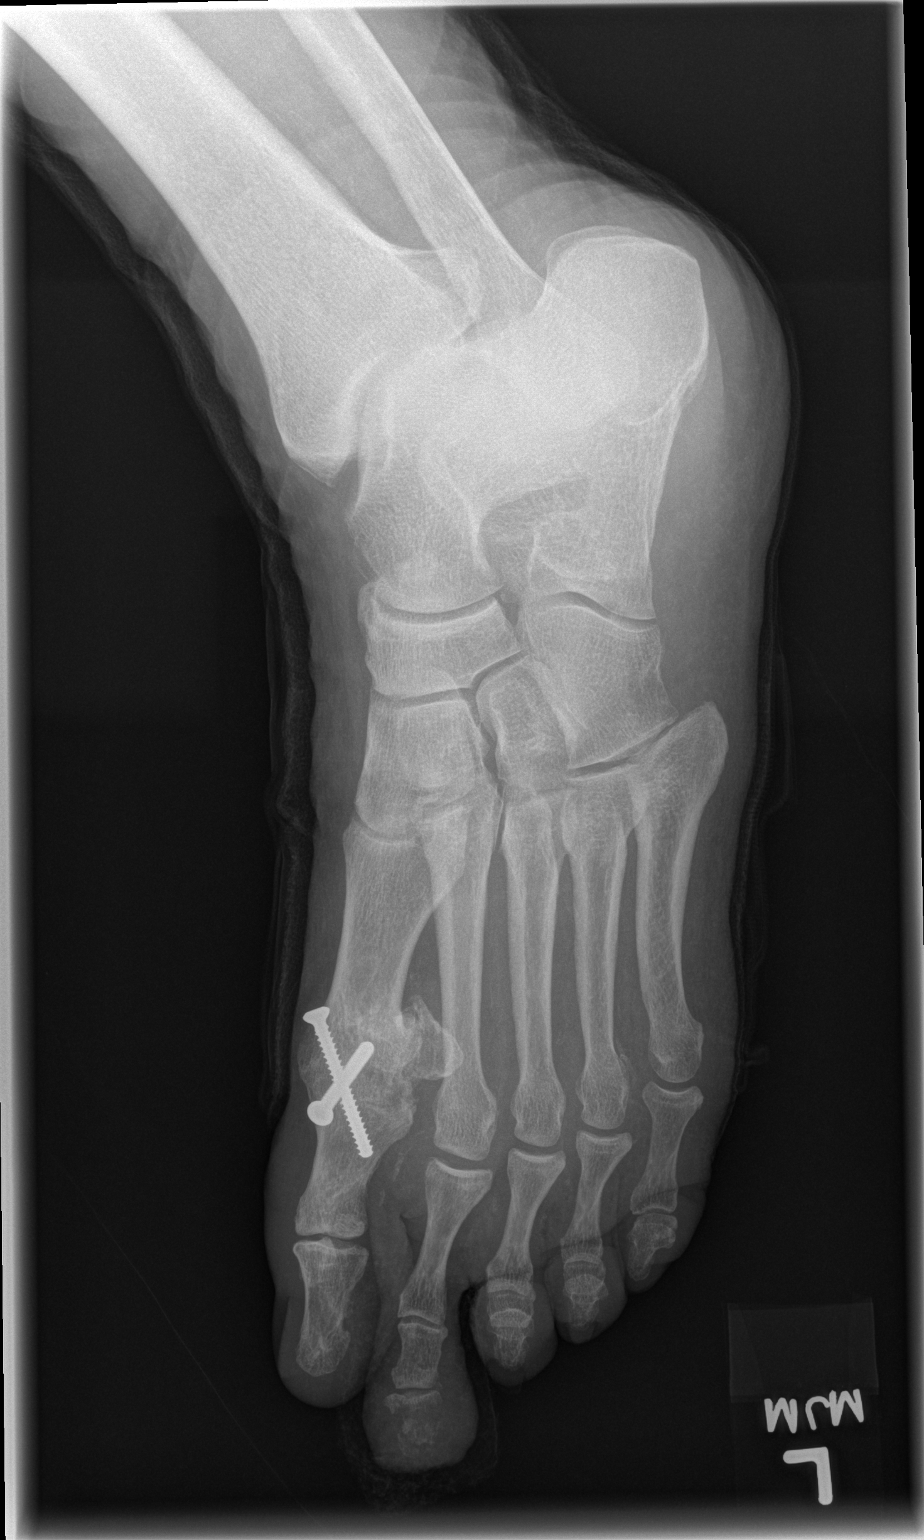

[t foot lat left]
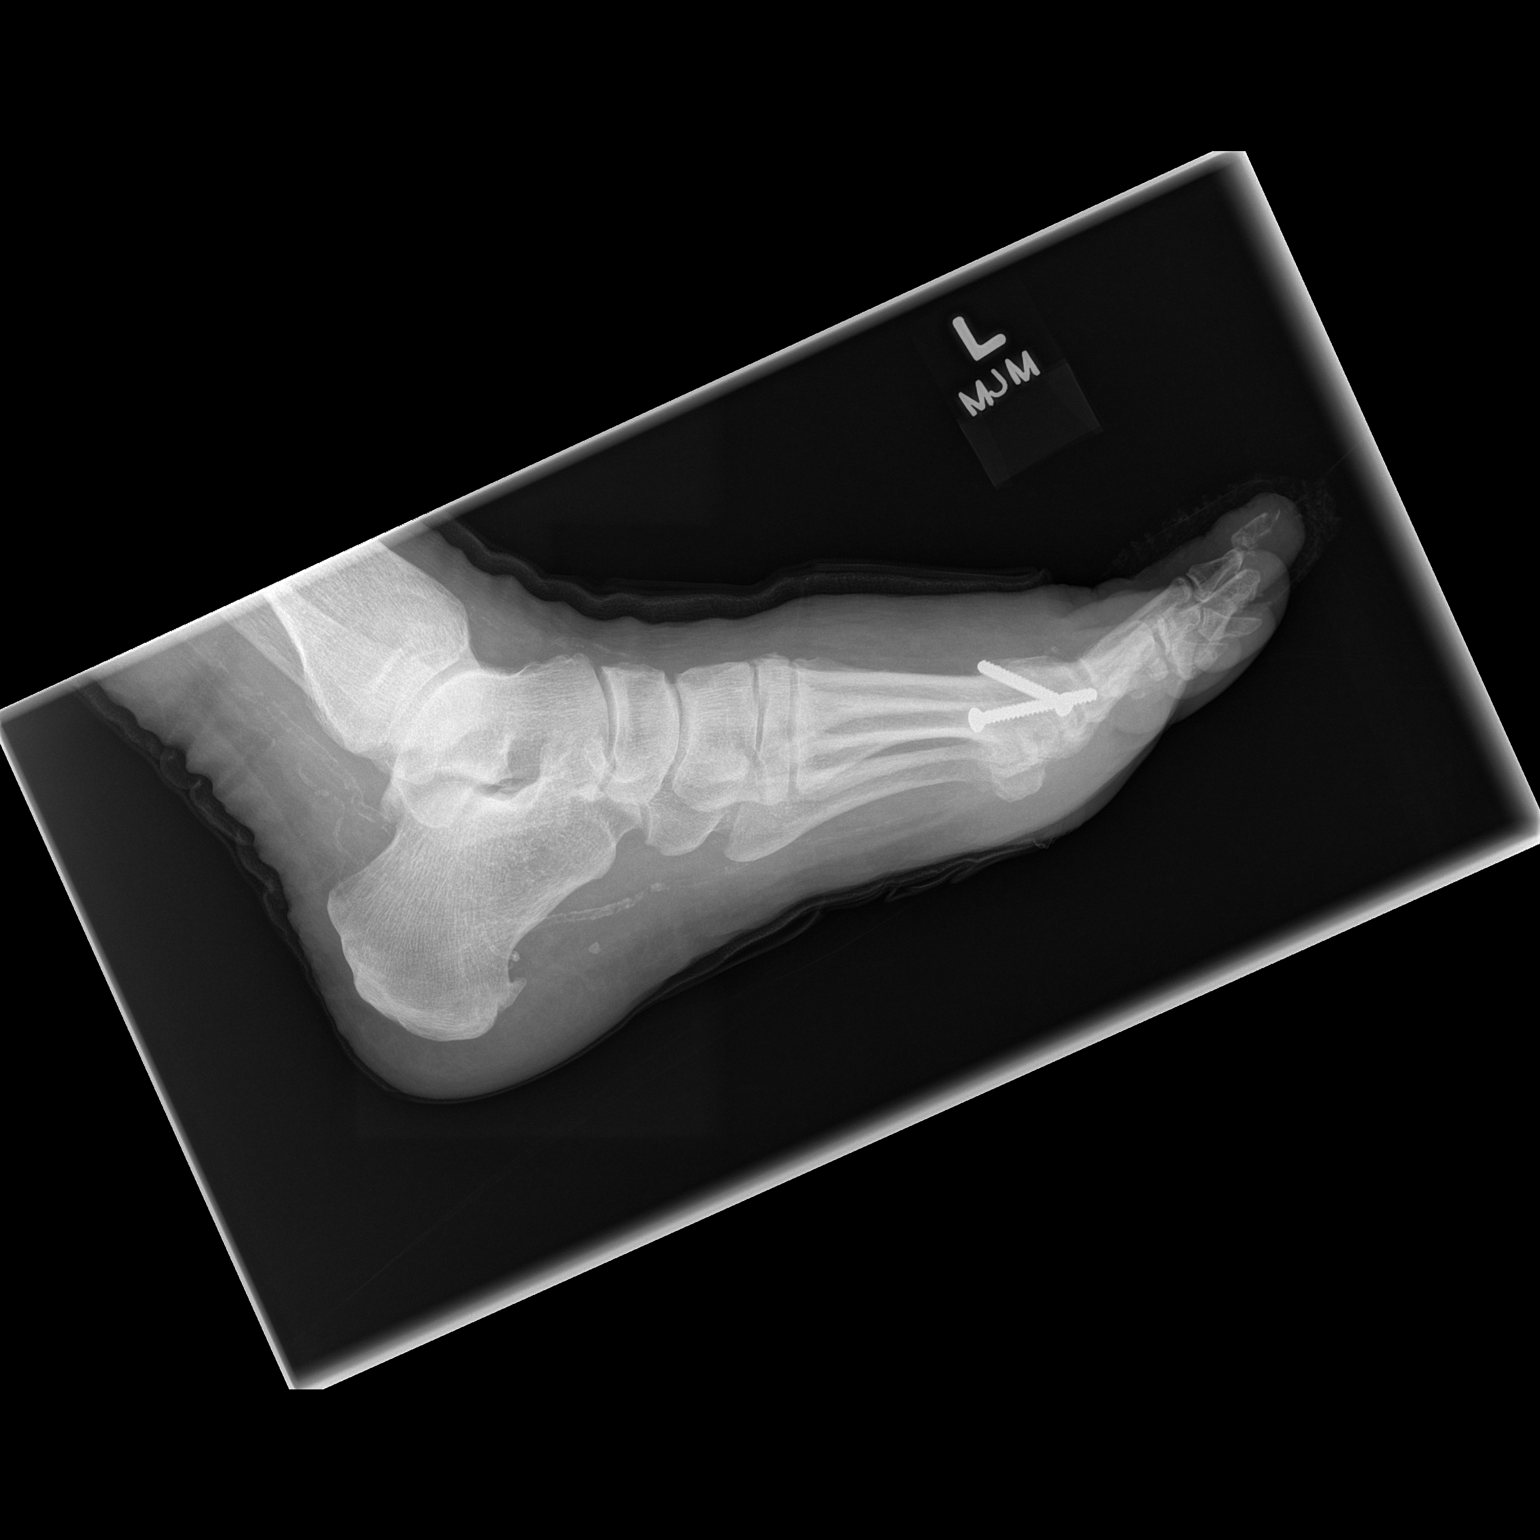

[3 of 3 positions shown; findings below may reference images not displayed]

FINDINGS: There is prior arthrodesis of the first MTP joint without hardware
failure or complication.

There is soft tissue ulceration of the distal aspect of the left
second digit. There is bone destruction and comminution of the
second distal phalanx.

There is no other acute fracture, dislocation or areas of bone
destruction. There is no soft tissue emphysema. There is a plantar
calcaneal spur. There is peripheral vascular atherosclerotic
disease.
IMPRESSION: 1. Soft tissue ulceration of the distal aspect the left second digit
with underlying bone destruction of the second distal phalanx most
consistent with osteomyelitis.

## 2014-10-23 DIAGNOSIS — Z794 Long term (current) use of insulin: Secondary | ICD-10-CM | POA: Diagnosis not present

## 2014-10-23 DIAGNOSIS — G4733 Obstructive sleep apnea (adult) (pediatric): Secondary | ICD-10-CM | POA: Diagnosis not present

## 2014-10-23 DIAGNOSIS — L97521 Non-pressure chronic ulcer of other part of left foot limited to breakdown of skin: Secondary | ICD-10-CM | POA: Diagnosis not present

## 2014-10-23 DIAGNOSIS — E11621 Type 2 diabetes mellitus with foot ulcer: Secondary | ICD-10-CM | POA: Diagnosis not present

## 2014-10-30 ENCOUNTER — Ambulatory Visit: Payer: BLUE CROSS/BLUE SHIELD | Admitting: Adult Health

## 2014-10-30 ENCOUNTER — Ambulatory Visit (INDEPENDENT_AMBULATORY_CARE_PROVIDER_SITE_OTHER): Payer: BLUE CROSS/BLUE SHIELD | Admitting: Adult Health

## 2014-10-30 ENCOUNTER — Other Ambulatory Visit: Payer: Self-pay | Admitting: Adult Health

## 2014-10-30 ENCOUNTER — Encounter: Payer: Self-pay | Admitting: Adult Health

## 2014-10-30 VITALS — BP 122/67 | HR 67 | Ht 70.0 in | Wt 228.0 lb

## 2014-10-30 DIAGNOSIS — G4730 Sleep apnea, unspecified: Secondary | ICD-10-CM

## 2014-10-30 DIAGNOSIS — G4733 Obstructive sleep apnea (adult) (pediatric): Secondary | ICD-10-CM

## 2014-10-30 DIAGNOSIS — Z23 Encounter for immunization: Secondary | ICD-10-CM

## 2014-10-30 DIAGNOSIS — D860 Sarcoidosis of lung: Secondary | ICD-10-CM | POA: Diagnosis not present

## 2014-10-30 DIAGNOSIS — I5032 Chronic diastolic (congestive) heart failure: Secondary | ICD-10-CM

## 2014-10-30 DIAGNOSIS — I5043 Acute on chronic combined systolic (congestive) and diastolic (congestive) heart failure: Secondary | ICD-10-CM | POA: Diagnosis not present

## 2014-10-30 LAB — BASIC METABOLIC PANEL
BUN: 33 mg/dL — AB (ref 7–25)
CHLORIDE: 102 mmol/L (ref 98–110)
CO2: 25 mmol/L (ref 20–31)
CREATININE: 1.36 mg/dL — AB (ref 0.70–1.25)
Calcium: 9.4 mg/dL (ref 8.6–10.3)
Glucose, Bld: 90 mg/dL (ref 65–99)
Potassium: 3.8 mmol/L (ref 3.5–5.3)
Sodium: 142 mmol/L (ref 135–146)

## 2014-10-30 MED ORDER — RABEPRAZOLE SODIUM 20 MG PO TBEC: 20.0000 mg | DELAYED_RELEASE_TABLET | Freq: Two times a day (BID) | ORAL | Status: DC | PRN

## 2014-10-30 MED ORDER — FUROSEMIDE 40 MG PO TABS: ORAL_TABLET | ORAL | Status: DC

## 2014-10-30 NOTE — Assessment & Plan Note (Signed)
Gr 2 D-CHF doing very well however may be too dry on high dose lasix  Check bmet , caution with renal fxn as underlying DM  Decrease Lasix 80mg  Twice daily   Decrease fluid intake -taking in high amounts of fluids Keep on low salt diet   Plan  Decrease Lasix to 40mg  2 tabs Twice daily  .  Low salt diet  Decrease Water intake some -no more than 5 glasses a day.  Avoid meds that work kidneys -such ibuprofen, advil , aleve,  NSAIDS Labs today .  Follow up in 3 months and As needed

## 2014-10-30 NOTE — Assessment & Plan Note (Signed)
Compensated on present regimen   Plan  Remain on prednisone 10mg  daily .  Prevnar vaccine today .  Follow up in 3 months and As needed

## 2014-10-30 NOTE — Progress Notes (Signed)
   Subjective:    Patient ID: [name], male    DOB: Nov 12, 1945, 69 y.o.   MRN: [id number]  HPI 69 yo male with pulmonary sarcoid on chronic steroids , Diastolic Dysfunction -GR 2 and OSA on nocturnal CPAP   10/30/2014 Follow up : Sarcoid /OSA /Diastolic Dysfunction  Pt returns for 3 month follow up .  He says overall he is doing well.  Is having trouble with poor healing toe , has been told bone has infection and has referral to  Surgeon for possible amputation. Has DM  Remains on prednisone 10mg  daily for Sarcoid .  No flare of cough or wheezing .  Discussed yearly eye exams.   On CPAP At bedtime  . Wears all night for at least 6hrs  Discussed not driving if sleepy and wt loss.   He has Diastolic CHF , gr 2 on last echo .  Takes lasix 80mg  in am and 120 mg in pm .  Says his wt has been great down 32lbs over last 6 mon  Says he has been drinking 8 water bottles daily b/c he was told he  Was dehydrated on blood work .  We discussed decreasing water intake .    PVX is utd . Not had prevnar.   Review of Systems Constitutional:   No  weight loss, night sweats,  Fevers, chills,  +fatigue, or  lassitude.  HEENT:   No headaches,  Difficulty swallowing,  Tooth/dental problems, or  Sore throat,                No sneezing, itching, ear ache,  +nasal congestion, post nasal drip,   CV:  No chest pain,  Orthopnea, PND,   anasarca, dizziness, palpitations, syncope.   GI  No heartburn, indigestion, abdominal pain, nausea, vomiting, diarrhea, change in bowel habits, loss of appetite, bloody stools.   Resp:    No wheezing.  No chest wall deformity  Skin: no rash or lesions.  GU: no dysuria, change in color of urine, no urgency or frequency.  No flank pain, no hematuria   MS:  No joint pain or swelling.  No decreased range of motion.  No back pain.  Psych:  No change in mood or affect. No depression or anxiety.  No memory loss.         Objective:   Physical Exam GEN: A/Ox3;  pleasant , NAD, overweight ,   Vital sign reviewed   HEENT:  [place]/AT,  EACs-clear, TMs-wnl, NOSE-clear, THROAT-clear, no lesions, no postnasal drip or exudate noted.   NECK:  Supple w/ fair ROM; no JVD; normal carotid impulses w/o bruits; no thyromegaly or nodules palpated; no lymphadenopathy.  RESP  Clear  P & A; w/o, wheezes/ rales/ or rhonchi.no accessory muscle use, no dullness to percussion  CARD:  RRR, no m/r/g  , tr  peripheral edema, pulses intact, no cyanosis or clubbing.  GI:   Soft & nt; nml bowel sounds; no organomegaly or masses detected.  Musco: Warm bil, no deformities or joint swelling noted.   Neuro: alert, no focal deficits noted.    Skin: Warm, no lesions or rashes         Assessment & Plan:

## 2014-10-30 NOTE — Assessment & Plan Note (Signed)
OSA doing well on CPAP   Plan  CPAP download  Do not drive if sleepy.  Wear CPAP at least 6 hrs each night  Work on weight loss.  Follow up in 3 months and As needed

## 2014-10-30 NOTE — Patient Instructions (Addendum)
Decrease Lasix to 40mg  2 tabs Twice daily  .  Low salt diet  Decrease Water intake some -no more than 5 glasses a day.  Avoid meds that work kidneys -such ibuprofen, advil , aleve,  NSAIDS Remain on prednisone 10mg  daily .  Prevnar vaccine today .  Labs today .  CPAP download  Do not drive if sleepy.  Wear CPAP at least 6 hrs each night  Work on weight loss.  Follow up in 3 months and As needed

## 2014-10-31 DIAGNOSIS — E11621 Type 2 diabetes mellitus with foot ulcer: Secondary | ICD-10-CM | POA: Diagnosis not present

## 2014-10-31 DIAGNOSIS — L97521 Non-pressure chronic ulcer of other part of left foot limited to breakdown of skin: Secondary | ICD-10-CM | POA: Diagnosis not present

## 2014-10-31 DIAGNOSIS — Z794 Long term (current) use of insulin: Secondary | ICD-10-CM | POA: Diagnosis not present

## 2014-10-31 DIAGNOSIS — G4733 Obstructive sleep apnea (adult) (pediatric): Secondary | ICD-10-CM | POA: Diagnosis not present

## 2014-11-03 NOTE — Progress Notes (Signed)
Quick Note:  Called and spoke with pt. Reviewed results and recs. Pt voiced understanding and had no further questions. Results faxed to PCP ______

## 2014-11-11 ENCOUNTER — Other Ambulatory Visit: Payer: Self-pay | Admitting: Critical Care Medicine

## 2014-11-13 ENCOUNTER — Ambulatory Visit (INDEPENDENT_AMBULATORY_CARE_PROVIDER_SITE_OTHER): Payer: BLUE CROSS/BLUE SHIELD | Admitting: Cardiology

## 2014-11-13 ENCOUNTER — Encounter: Payer: Self-pay | Admitting: Cardiology

## 2014-11-13 VITALS — BP 128/60 | HR 91 | Ht 70.0 in | Wt 226.8 lb

## 2014-11-13 DIAGNOSIS — IMO0002 Reserved for concepts with insufficient information to code with codable children: Secondary | ICD-10-CM

## 2014-11-13 DIAGNOSIS — I2510 Atherosclerotic heart disease of native coronary artery without angina pectoris: Secondary | ICD-10-CM

## 2014-11-13 DIAGNOSIS — I5032 Chronic diastolic (congestive) heart failure: Secondary | ICD-10-CM

## 2014-11-13 DIAGNOSIS — E1165 Type 2 diabetes mellitus with hyperglycemia: Secondary | ICD-10-CM | POA: Diagnosis not present

## 2014-11-13 NOTE — Progress Notes (Signed)
[facility] [address] [place], [place]  [address] Phone: [phone] Fax:  [phone]  Date:  11/13/2014   ID:  [name], [name] 1945/11/07, MRN [id number]  PCP:  Shirline Frees, MD   History of Present Illness: [name] is a 69 y.o. male with coronary artery disease, drug eluting stent to LAD and posterior lateral branch 10/2009, cholecystectomy November 2012, history of pulmonary sarcoid. Cardiac catheterization 10/12/11 that revealed patent LAD, small vessels with normal left ventricular function of 55%, increased left ventricular end-diastolic pressure. Subsequent catheterization was performed on 04/01/14 which resulted in continued medical management. There was moderate diffuse disease in the small caliber distal LAD segment. He has had chronic symptoms of shortness of breath,  decreased endurance which has recently improved with weight loss.   No evidence of increase sarcoidosis on CT scan. Difficult diabetes. Dr. Joya Gaskins on chronic prednisone therapy which seemed to help with lower extremity discomfort.  Wife surgery stroke after neck surgery. Daughter killed in tractor accident.  He has been out of isosorbide. In the past, not able to tolerate beta blockers.   He was decreased Lasix 40 mg twice a day from 80 mg twice a day. Creatinine 1.36   Wt Readings from Last 3 Encounters:  11/13/14 226 lb 12.8 oz (102.876 kg)  10/30/14 228 lb (103.42 kg)  07/24/14 238 lb (107.956 kg)     Past Medical History  Diagnosis Date  . Type 2 diabetes mellitus   . Sarcoid   . Adrenal insufficiency   . Hypertension   . OSA (obstructive sleep apnea)     uses VPAC sleep study 2 years done through [facility]. Dr. Marlou Porch arranged study  . Coronary artery disease     has stents  . Heart attack 2010  . Pneumonia     3-4 years ago  . Diabetes mellitus     insulin and pills  . Coronary atherosclerosis of native coronary artery     Proximal LAD, posterior lateral stent widely patent-10/12/11     Past Surgical History  Procedure Laterality Date  . Coronary stent placement  2009    in LAD and side branch PTCA  . Cataract extraction  2011    bilat  . Intercostal nerve block  2011    x2. lumbar spine  . Appendectomy    . Coronary angioplasty      most recent 11/2009  . Cholecystectomy  01/25/2011    Procedure: LAPAROSCOPIC CHOLECYSTECTOMY WITH INTRAOPERATIVE CHOLANGIOGRAM;  Surgeon: Judieth Keens, DO;  Location: [facility];  Service: General;  Laterality: N/A;  . Left heart catheterization with coronary angiogram Bilateral 10/12/2011    Procedure: LEFT HEART CATHETERIZATION WITH CORONARY ANGIOGRAM;  Surgeon: Candee Furbish, MD;  Location: [facility] CATH LAB;  Service: Cardiovascular;  Laterality: Bilateral;  . Left heart catheterization with coronary angiogram N/A 04/01/2014    Procedure: LEFT HEART CATHETERIZATION WITH CORONARY ANGIOGRAM;  Surgeon: Candee Furbish, MD;  Location: [facility] CATH LAB;  Service: Cardiovascular;  Laterality: N/A;    Current Outpatient Prescriptions  Medication Sig Dispense Refill  . acetaminophen (TYLENOL) 500 MG tablet Take 500 mg by mouth daily.    Marland Kitchen albuterol (PROVENTIL) (2.5 MG/3ML) 0.083% nebulizer solution Take 3 mLs (2.5 mg total) by nebulization every 6 (six) hours as needed for wheezing or shortness of breath. Dx Code D86.0 360 mL 5  . Albuterol Sulfate (PROAIR RESPICLICK) 891 (90 BASE) MCG/ACT AEPB Inhale 2 puffs into the lungs every 6 (  six) hours as needed. 1 each 6  . aspirin EC 81 MG tablet Take 81 mg by mouth daily.    . Cholecalciferol (VITAMIN D3) 3000 UNITS TABS Take 3,000 Units by mouth daily.    . clopidogrel (PLAVIX) 75 MG tablet Take 1 tablet (75 mg total) by mouth daily. 90 tablet 4  . Coenzyme Q10 (COQ10 MAXIMUM STRENGTH PO) Take 1 tablet by mouth daily with lunch.    . fish oil-omega-3 fatty acids 1000 MG capsule Take 3 g by mouth daily with lunch. Omega X - 3    . furosemide (LASIX) 40 MG tablet Take 40 mg by mouth. Take 1-2 Tablets in pm  depending on swelling    . furosemide (LASIX) 80 MG tablet Take 80 mg by mouth daily. In the am    . glipiZIDE (GLUCOTROL) 10 MG tablet Take 20 mg by mouth 2 (two) times daily.      . insulin aspart protamine-insulin aspart (NOVOLOG 70/30) (70-30) 100 UNIT/ML injection Inject 40-50 Units into the skin 3 (three) times daily as needed (CBG >100). If blood sugar <100 0 units, 100-149 40 units, >150 units 50 units    . isosorbide mononitrate (IMDUR) 60 MG 24 hr tablet Take 1 tablet (60 mg total) by mouth daily. 90 tablet 3  . losartan (COZAAR) 100 MG tablet TAKE 1 TABLET BY MOUTH EVERY DAY (Patient taking differently: Take 100 mg by mouth daily. ) 90 tablet 1  . mometasone (NASONEX) 50 MCG/ACT nasal spray Place 2 sprays into the nose daily as needed (congestion). 17 g 6  . potassium chloride (KLOR-CON M10) 10 MEQ tablet Take 2 tablets (20 mEq total) by mouth 2 (two) times daily. 360 tablet 1  . predniSONE (DELTASONE) 10 MG tablet Take 1 tablet (10 mg total) by mouth daily with breakfast. T 30 tablet 6  . RABEprazole (ACIPHEX) 20 MG tablet Take 1 tablet (20 mg total) by mouth 2 (two) times daily as needed. 60 tablet 5  . Vitamin D, Ergocalciferol, (DRISDOL) 50000 UNITS CAPS capsule Take 50,000 Units by mouth every 7 (seven) days. On Sundays  1   No current facility-administered medications for this visit.    Allergies:    Allergies  Allergen Reactions  . Hydrocortisone Nausea Only  . Statins Other (See Comments)    Pt says they make him "mean"    Social History:  The patient  reports that he has never smoked. He has never used smokeless tobacco. He reports that he does not drink alcohol or use illicit drugs.   Family History  Problem Relation Age of Onset  . Asthma Sister   . Anesthesia problems Sister     "Kidney's did not wake up"  . Kidney disease Mother   . Diabetes Mother   . Kidney cancer Mother   . Cancer Mother     kidney  . Heart attack Father     ROS:  Please see the  history of present illness.   Denies any syncope, bleeding, orthopnea, PND.   All other systems reviewed and negative.   PHYSICAL EXAM: VS:  BP 128/60 mmHg  Pulse 91  Ht 5\' 10"  (1.778 m)  Wt 226 lb 12.8 oz (102.876 kg)  BMI 32.54 kg/m2  SpO2 94% Well nourished, well developed, in no acute distress HEENT: normal, [place]/AT, EOMI Neck: no JVD, normal carotid upstroke, no bruit Cardiac:  normal S1, S2; RRR; no murmur Lungs:  clear to auscultation bilaterally, no wheezing, rhonchi or rales Abd: soft,  nontender, no hepatomegaly, no bruitsobese Ext: no edema, 2+ distal pulses Skin: warm and drythickened GU: deferred Neuro: no focal abnormalities noted, AAO x 3  EKG:  03/03/14-sinus rhythm, LAFB, septal infarct, no ST segment changes, heart rate 84 bpm.  ASSESSMENT AND PLAN:  1. Chronic diastolic heart failure-in the past, he has not tolerated beta blockers. Continue with other medications. His Lasix has been decreased to 80 mg morning 40 mg evening 2. Chronic stable angina/dyspnea- Prior catheterization repeat as above. Reassurance. He is feeling much better after weight loss. 3. Fluid overload-much improved. 4. Obesity-encourage weight loss. 5. Diabetes-uncontrolled-previously A1c in the 9 range. Continue to work with Dr. Kenton Kingfisher. 6. Pulmonary sarcoid-Dr. Joya Gaskins. Tammy Parrett.  Prednisone. No recent increase on CT. 7. Hyperlipidemia-hypertriglyceridemia in the setting of uncontrolled diabetes. This will be challenging control as long as his lipids are elevated. 8. Left foot wound-contemplating hyperbaric wound treatment. I would be fine with him proceeding with this if necessary. 9. We will see back in 6 months.  Signed, Candee Furbish, MD [facility]  11/13/2014 3:53 PM    .

## 2014-11-13 NOTE — Patient Instructions (Signed)
Medication Instructions:  The current medical regimen is effective;  continue present plan and medications.  Follow-Up: Follow up in 6 months with Dr. Skains.  You will receive a letter in the mail 2 months before you are due.  Please call us when you receive this letter to schedule your follow up appointment.  Thank you for choosing [facility]!!     

## 2014-11-14 ENCOUNTER — Encounter (HOSPITAL_BASED_OUTPATIENT_CLINIC_OR_DEPARTMENT_OTHER): Payer: BLUE CROSS/BLUE SHIELD | Attending: Internal Medicine

## 2014-11-14 DIAGNOSIS — I10 Essential (primary) hypertension: Secondary | ICD-10-CM | POA: Diagnosis not present

## 2014-11-14 DIAGNOSIS — E11621 Type 2 diabetes mellitus with foot ulcer: Secondary | ICD-10-CM | POA: Diagnosis not present

## 2014-11-14 DIAGNOSIS — I509 Heart failure, unspecified: Secondary | ICD-10-CM | POA: Diagnosis not present

## 2014-11-14 DIAGNOSIS — B9562 Methicillin resistant Staphylococcus aureus infection as the cause of diseases classified elsewhere: Secondary | ICD-10-CM | POA: Insufficient documentation

## 2014-11-14 DIAGNOSIS — I2510 Atherosclerotic heart disease of native coronary artery without angina pectoris: Secondary | ICD-10-CM | POA: Diagnosis not present

## 2014-11-14 DIAGNOSIS — L97524 Non-pressure chronic ulcer of other part of left foot with necrosis of bone: Secondary | ICD-10-CM | POA: Diagnosis not present

## 2014-11-14 DIAGNOSIS — G4730 Sleep apnea, unspecified: Secondary | ICD-10-CM | POA: Insufficient documentation

## 2014-11-21 DIAGNOSIS — E11621 Type 2 diabetes mellitus with foot ulcer: Secondary | ICD-10-CM | POA: Diagnosis not present

## 2014-11-21 DIAGNOSIS — G4730 Sleep apnea, unspecified: Secondary | ICD-10-CM | POA: Diagnosis not present

## 2014-11-21 DIAGNOSIS — L97524 Non-pressure chronic ulcer of other part of left foot with necrosis of bone: Secondary | ICD-10-CM | POA: Diagnosis not present

## 2014-11-21 DIAGNOSIS — I509 Heart failure, unspecified: Secondary | ICD-10-CM | POA: Diagnosis not present

## 2014-11-25 DIAGNOSIS — E11621 Type 2 diabetes mellitus with foot ulcer: Secondary | ICD-10-CM | POA: Diagnosis not present

## 2014-11-25 DIAGNOSIS — I509 Heart failure, unspecified: Secondary | ICD-10-CM | POA: Diagnosis not present

## 2014-11-25 DIAGNOSIS — G4730 Sleep apnea, unspecified: Secondary | ICD-10-CM | POA: Diagnosis not present

## 2014-11-25 DIAGNOSIS — L97524 Non-pressure chronic ulcer of other part of left foot with necrosis of bone: Secondary | ICD-10-CM | POA: Diagnosis not present

## 2014-11-25 LAB — GLUCOSE, CAPILLARY
Glucose-Capillary: 170 mg/dL — ABNORMAL HIGH (ref 65–99)
Glucose-Capillary: 160 mg/dL — ABNORMAL HIGH (ref 65–99)

## 2014-11-26 DIAGNOSIS — E11621 Type 2 diabetes mellitus with foot ulcer: Secondary | ICD-10-CM | POA: Diagnosis not present

## 2014-11-26 LAB — GLUCOSE, CAPILLARY
GLUCOSE-CAPILLARY: 222 mg/dL — AB (ref 65–99)
Glucose-Capillary: 184 mg/dL — ABNORMAL HIGH (ref 65–99)

## 2014-11-27 DIAGNOSIS — L97524 Non-pressure chronic ulcer of other part of left foot with necrosis of bone: Secondary | ICD-10-CM | POA: Diagnosis not present

## 2014-11-27 DIAGNOSIS — G4730 Sleep apnea, unspecified: Secondary | ICD-10-CM | POA: Diagnosis not present

## 2014-11-27 DIAGNOSIS — E11621 Type 2 diabetes mellitus with foot ulcer: Secondary | ICD-10-CM | POA: Diagnosis not present

## 2014-11-27 DIAGNOSIS — I509 Heart failure, unspecified: Secondary | ICD-10-CM | POA: Diagnosis not present

## 2014-11-27 LAB — GLUCOSE, CAPILLARY
GLUCOSE-CAPILLARY: 156 mg/dL — AB (ref 65–99)
GLUCOSE-CAPILLARY: 132 mg/dL — AB (ref 65–99)

## 2014-11-28 DIAGNOSIS — I509 Heart failure, unspecified: Secondary | ICD-10-CM | POA: Diagnosis not present

## 2014-11-28 DIAGNOSIS — L97524 Non-pressure chronic ulcer of other part of left foot with necrosis of bone: Secondary | ICD-10-CM | POA: Diagnosis not present

## 2014-11-28 DIAGNOSIS — E11621 Type 2 diabetes mellitus with foot ulcer: Secondary | ICD-10-CM | POA: Diagnosis not present

## 2014-11-28 DIAGNOSIS — G4730 Sleep apnea, unspecified: Secondary | ICD-10-CM | POA: Diagnosis not present

## 2014-11-28 LAB — GLUCOSE, CAPILLARY
GLUCOSE-CAPILLARY: 337 mg/dL — AB (ref 65–99)
Glucose-Capillary: 209 mg/dL — ABNORMAL HIGH (ref 65–99)

## 2014-12-01 DIAGNOSIS — I509 Heart failure, unspecified: Secondary | ICD-10-CM | POA: Diagnosis not present

## 2014-12-01 DIAGNOSIS — E11621 Type 2 diabetes mellitus with foot ulcer: Secondary | ICD-10-CM | POA: Diagnosis not present

## 2014-12-01 DIAGNOSIS — G4730 Sleep apnea, unspecified: Secondary | ICD-10-CM | POA: Diagnosis not present

## 2014-12-01 DIAGNOSIS — L97524 Non-pressure chronic ulcer of other part of left foot with necrosis of bone: Secondary | ICD-10-CM | POA: Diagnosis not present

## 2014-12-01 LAB — GLUCOSE, CAPILLARY
GLUCOSE-CAPILLARY: 192 mg/dL — AB (ref 65–99)
Glucose-Capillary: 178 mg/dL — ABNORMAL HIGH (ref 65–99)

## 2014-12-02 DIAGNOSIS — L97524 Non-pressure chronic ulcer of other part of left foot with necrosis of bone: Secondary | ICD-10-CM | POA: Diagnosis not present

## 2014-12-02 DIAGNOSIS — I509 Heart failure, unspecified: Secondary | ICD-10-CM | POA: Diagnosis not present

## 2014-12-02 DIAGNOSIS — G4730 Sleep apnea, unspecified: Secondary | ICD-10-CM | POA: Diagnosis not present

## 2014-12-02 DIAGNOSIS — E11621 Type 2 diabetes mellitus with foot ulcer: Secondary | ICD-10-CM | POA: Diagnosis not present

## 2014-12-02 LAB — GLUCOSE, CAPILLARY
Glucose-Capillary: 132 mg/dL — ABNORMAL HIGH (ref 65–99)
Glucose-Capillary: 105 mg/dL — ABNORMAL HIGH (ref 65–99)

## 2014-12-03 DIAGNOSIS — E11621 Type 2 diabetes mellitus with foot ulcer: Secondary | ICD-10-CM | POA: Diagnosis not present

## 2014-12-03 LAB — GLUCOSE, CAPILLARY
GLUCOSE-CAPILLARY: 105 mg/dL — AB (ref 65–99)
GLUCOSE-CAPILLARY: 98 mg/dL (ref 65–99)
Glucose-Capillary: 146 mg/dL — ABNORMAL HIGH (ref 65–99)

## 2014-12-04 DIAGNOSIS — G4730 Sleep apnea, unspecified: Secondary | ICD-10-CM | POA: Diagnosis not present

## 2014-12-04 DIAGNOSIS — E11621 Type 2 diabetes mellitus with foot ulcer: Secondary | ICD-10-CM | POA: Diagnosis not present

## 2014-12-04 DIAGNOSIS — I509 Heart failure, unspecified: Secondary | ICD-10-CM | POA: Diagnosis not present

## 2014-12-04 DIAGNOSIS — L97524 Non-pressure chronic ulcer of other part of left foot with necrosis of bone: Secondary | ICD-10-CM | POA: Diagnosis not present

## 2014-12-04 LAB — GLUCOSE, CAPILLARY
Glucose-Capillary: 165 mg/dL — ABNORMAL HIGH (ref 65–99)
Glucose-Capillary: 129 mg/dL — ABNORMAL HIGH (ref 65–99)

## 2014-12-05 DIAGNOSIS — I509 Heart failure, unspecified: Secondary | ICD-10-CM | POA: Diagnosis not present

## 2014-12-05 DIAGNOSIS — L97524 Non-pressure chronic ulcer of other part of left foot with necrosis of bone: Secondary | ICD-10-CM | POA: Diagnosis not present

## 2014-12-05 DIAGNOSIS — E11621 Type 2 diabetes mellitus with foot ulcer: Secondary | ICD-10-CM | POA: Diagnosis not present

## 2014-12-05 DIAGNOSIS — G4730 Sleep apnea, unspecified: Secondary | ICD-10-CM | POA: Diagnosis not present

## 2014-12-05 LAB — GLUCOSE, CAPILLARY
GLUCOSE-CAPILLARY: 118 mg/dL — AB (ref 65–99)
Glucose-Capillary: 252 mg/dL — ABNORMAL HIGH (ref 65–99)

## 2014-12-07 ENCOUNTER — Other Ambulatory Visit: Payer: Self-pay | Admitting: Critical Care Medicine

## 2014-12-08 ENCOUNTER — Encounter (HOSPITAL_BASED_OUTPATIENT_CLINIC_OR_DEPARTMENT_OTHER): Payer: BLUE CROSS/BLUE SHIELD | Attending: Internal Medicine

## 2014-12-08 DIAGNOSIS — I2510 Atherosclerotic heart disease of native coronary artery without angina pectoris: Secondary | ICD-10-CM | POA: Diagnosis not present

## 2014-12-08 DIAGNOSIS — I509 Heart failure, unspecified: Secondary | ICD-10-CM | POA: Insufficient documentation

## 2014-12-08 DIAGNOSIS — E11621 Type 2 diabetes mellitus with foot ulcer: Secondary | ICD-10-CM | POA: Diagnosis present

## 2014-12-08 DIAGNOSIS — L97521 Non-pressure chronic ulcer of other part of left foot limited to breakdown of skin: Secondary | ICD-10-CM | POA: Insufficient documentation

## 2014-12-08 DIAGNOSIS — M869 Osteomyelitis, unspecified: Secondary | ICD-10-CM | POA: Diagnosis not present

## 2014-12-08 DIAGNOSIS — I110 Hypertensive heart disease with heart failure: Secondary | ICD-10-CM | POA: Insufficient documentation

## 2014-12-08 DIAGNOSIS — G4730 Sleep apnea, unspecified: Secondary | ICD-10-CM | POA: Diagnosis not present

## 2014-12-08 LAB — GLUCOSE, CAPILLARY
GLUCOSE-CAPILLARY: 155 mg/dL — AB (ref 65–99)
GLUCOSE-CAPILLARY: 232 mg/dL — AB (ref 65–99)

## 2014-12-09 DIAGNOSIS — M869 Osteomyelitis, unspecified: Secondary | ICD-10-CM | POA: Diagnosis not present

## 2014-12-09 DIAGNOSIS — E11621 Type 2 diabetes mellitus with foot ulcer: Secondary | ICD-10-CM | POA: Diagnosis not present

## 2014-12-09 DIAGNOSIS — L97521 Non-pressure chronic ulcer of other part of left foot limited to breakdown of skin: Secondary | ICD-10-CM | POA: Diagnosis not present

## 2014-12-09 DIAGNOSIS — G4730 Sleep apnea, unspecified: Secondary | ICD-10-CM | POA: Diagnosis not present

## 2014-12-09 LAB — GLUCOSE, CAPILLARY
GLUCOSE-CAPILLARY: 160 mg/dL — AB (ref 65–99)
GLUCOSE-CAPILLARY: 269 mg/dL — AB (ref 65–99)

## 2014-12-10 DIAGNOSIS — E11621 Type 2 diabetes mellitus with foot ulcer: Secondary | ICD-10-CM | POA: Diagnosis not present

## 2014-12-10 LAB — GLUCOSE, CAPILLARY
GLUCOSE-CAPILLARY: 174 mg/dL — AB (ref 65–99)
Glucose-Capillary: 135 mg/dL — ABNORMAL HIGH (ref 65–99)

## 2014-12-11 DIAGNOSIS — E11621 Type 2 diabetes mellitus with foot ulcer: Secondary | ICD-10-CM | POA: Diagnosis not present

## 2014-12-11 DIAGNOSIS — G4730 Sleep apnea, unspecified: Secondary | ICD-10-CM | POA: Diagnosis not present

## 2014-12-11 DIAGNOSIS — L97521 Non-pressure chronic ulcer of other part of left foot limited to breakdown of skin: Secondary | ICD-10-CM | POA: Diagnosis not present

## 2014-12-11 DIAGNOSIS — M869 Osteomyelitis, unspecified: Secondary | ICD-10-CM | POA: Diagnosis not present

## 2014-12-11 LAB — GLUCOSE, CAPILLARY
GLUCOSE-CAPILLARY: 270 mg/dL — AB (ref 65–99)
Glucose-Capillary: 167 mg/dL — ABNORMAL HIGH (ref 65–99)

## 2014-12-12 DIAGNOSIS — E11621 Type 2 diabetes mellitus with foot ulcer: Secondary | ICD-10-CM | POA: Diagnosis not present

## 2014-12-12 DIAGNOSIS — L97521 Non-pressure chronic ulcer of other part of left foot limited to breakdown of skin: Secondary | ICD-10-CM | POA: Diagnosis not present

## 2014-12-12 DIAGNOSIS — G4730 Sleep apnea, unspecified: Secondary | ICD-10-CM | POA: Diagnosis not present

## 2014-12-12 DIAGNOSIS — M869 Osteomyelitis, unspecified: Secondary | ICD-10-CM | POA: Diagnosis not present

## 2014-12-12 LAB — GLUCOSE, CAPILLARY
GLUCOSE-CAPILLARY: 225 mg/dL — AB (ref 65–99)
Glucose-Capillary: 114 mg/dL — ABNORMAL HIGH (ref 65–99)

## 2014-12-15 DIAGNOSIS — L97521 Non-pressure chronic ulcer of other part of left foot limited to breakdown of skin: Secondary | ICD-10-CM | POA: Diagnosis not present

## 2014-12-15 DIAGNOSIS — G4730 Sleep apnea, unspecified: Secondary | ICD-10-CM | POA: Diagnosis not present

## 2014-12-15 DIAGNOSIS — M869 Osteomyelitis, unspecified: Secondary | ICD-10-CM | POA: Diagnosis not present

## 2014-12-15 DIAGNOSIS — E11621 Type 2 diabetes mellitus with foot ulcer: Secondary | ICD-10-CM | POA: Diagnosis not present

## 2014-12-15 LAB — GLUCOSE, CAPILLARY
Glucose-Capillary: 138 mg/dL — ABNORMAL HIGH (ref 65–99)
Glucose-Capillary: 98 mg/dL (ref 65–99)
Glucose-Capillary: 254 mg/dL — ABNORMAL HIGH (ref 65–99)

## 2014-12-16 DIAGNOSIS — E11621 Type 2 diabetes mellitus with foot ulcer: Secondary | ICD-10-CM | POA: Diagnosis not present

## 2014-12-16 DIAGNOSIS — L97521 Non-pressure chronic ulcer of other part of left foot limited to breakdown of skin: Secondary | ICD-10-CM | POA: Diagnosis not present

## 2014-12-16 DIAGNOSIS — M869 Osteomyelitis, unspecified: Secondary | ICD-10-CM | POA: Diagnosis not present

## 2014-12-16 DIAGNOSIS — G4730 Sleep apnea, unspecified: Secondary | ICD-10-CM | POA: Diagnosis not present

## 2014-12-16 LAB — GLUCOSE, CAPILLARY
GLUCOSE-CAPILLARY: 126 mg/dL — AB (ref 65–99)
GLUCOSE-CAPILLARY: 89 mg/dL (ref 65–99)
Glucose-Capillary: 321 mg/dL — ABNORMAL HIGH (ref 65–99)

## 2014-12-17 DIAGNOSIS — E11621 Type 2 diabetes mellitus with foot ulcer: Secondary | ICD-10-CM | POA: Diagnosis not present

## 2014-12-17 LAB — GLUCOSE, CAPILLARY
GLUCOSE-CAPILLARY: 270 mg/dL — AB (ref 65–99)
GLUCOSE-CAPILLARY: 232 mg/dL — AB (ref 65–99)

## 2014-12-18 DIAGNOSIS — G4730 Sleep apnea, unspecified: Secondary | ICD-10-CM | POA: Diagnosis not present

## 2014-12-18 DIAGNOSIS — E11621 Type 2 diabetes mellitus with foot ulcer: Secondary | ICD-10-CM | POA: Diagnosis not present

## 2014-12-18 DIAGNOSIS — M869 Osteomyelitis, unspecified: Secondary | ICD-10-CM | POA: Diagnosis not present

## 2014-12-18 DIAGNOSIS — L97521 Non-pressure chronic ulcer of other part of left foot limited to breakdown of skin: Secondary | ICD-10-CM | POA: Diagnosis not present

## 2014-12-18 LAB — GLUCOSE, CAPILLARY
GLUCOSE-CAPILLARY: 220 mg/dL — AB (ref 65–99)
Glucose-Capillary: 109 mg/dL — ABNORMAL HIGH (ref 65–99)

## 2014-12-19 DIAGNOSIS — M869 Osteomyelitis, unspecified: Secondary | ICD-10-CM | POA: Diagnosis not present

## 2014-12-19 DIAGNOSIS — E11621 Type 2 diabetes mellitus with foot ulcer: Secondary | ICD-10-CM | POA: Diagnosis not present

## 2014-12-19 DIAGNOSIS — L97521 Non-pressure chronic ulcer of other part of left foot limited to breakdown of skin: Secondary | ICD-10-CM | POA: Diagnosis not present

## 2014-12-19 DIAGNOSIS — G4730 Sleep apnea, unspecified: Secondary | ICD-10-CM | POA: Diagnosis not present

## 2014-12-19 LAB — GLUCOSE, CAPILLARY
Glucose-Capillary: 251 mg/dL — ABNORMAL HIGH (ref 65–99)
Glucose-Capillary: 327 mg/dL — ABNORMAL HIGH (ref 65–99)

## 2014-12-22 DIAGNOSIS — L97521 Non-pressure chronic ulcer of other part of left foot limited to breakdown of skin: Secondary | ICD-10-CM | POA: Diagnosis not present

## 2014-12-22 DIAGNOSIS — G4730 Sleep apnea, unspecified: Secondary | ICD-10-CM | POA: Diagnosis not present

## 2014-12-22 DIAGNOSIS — M869 Osteomyelitis, unspecified: Secondary | ICD-10-CM | POA: Diagnosis not present

## 2014-12-22 DIAGNOSIS — E11621 Type 2 diabetes mellitus with foot ulcer: Secondary | ICD-10-CM | POA: Diagnosis not present

## 2014-12-22 LAB — GLUCOSE, CAPILLARY
Glucose-Capillary: 137 mg/dL — ABNORMAL HIGH (ref 65–99)
Glucose-Capillary: 190 mg/dL — ABNORMAL HIGH (ref 65–99)

## 2014-12-23 DIAGNOSIS — L97521 Non-pressure chronic ulcer of other part of left foot limited to breakdown of skin: Secondary | ICD-10-CM | POA: Diagnosis not present

## 2014-12-23 DIAGNOSIS — G4730 Sleep apnea, unspecified: Secondary | ICD-10-CM | POA: Diagnosis not present

## 2014-12-23 DIAGNOSIS — E11621 Type 2 diabetes mellitus with foot ulcer: Secondary | ICD-10-CM | POA: Diagnosis not present

## 2014-12-23 DIAGNOSIS — M869 Osteomyelitis, unspecified: Secondary | ICD-10-CM | POA: Diagnosis not present

## 2014-12-23 LAB — GLUCOSE, CAPILLARY
GLUCOSE-CAPILLARY: 230 mg/dL — AB (ref 65–99)
Glucose-Capillary: 161 mg/dL — ABNORMAL HIGH (ref 65–99)

## 2014-12-24 DIAGNOSIS — E11621 Type 2 diabetes mellitus with foot ulcer: Secondary | ICD-10-CM | POA: Diagnosis not present

## 2014-12-24 LAB — GLUCOSE, CAPILLARY
GLUCOSE-CAPILLARY: 228 mg/dL — AB (ref 65–99)
GLUCOSE-CAPILLARY: 264 mg/dL — AB (ref 65–99)

## 2014-12-25 DIAGNOSIS — M869 Osteomyelitis, unspecified: Secondary | ICD-10-CM | POA: Diagnosis not present

## 2014-12-25 DIAGNOSIS — G4730 Sleep apnea, unspecified: Secondary | ICD-10-CM | POA: Diagnosis not present

## 2014-12-25 DIAGNOSIS — E11621 Type 2 diabetes mellitus with foot ulcer: Secondary | ICD-10-CM | POA: Diagnosis not present

## 2014-12-25 DIAGNOSIS — L97521 Non-pressure chronic ulcer of other part of left foot limited to breakdown of skin: Secondary | ICD-10-CM | POA: Diagnosis not present

## 2014-12-25 LAB — GLUCOSE, CAPILLARY
GLUCOSE-CAPILLARY: 200 mg/dL — AB (ref 65–99)
Glucose-Capillary: 246 mg/dL — ABNORMAL HIGH (ref 65–99)

## 2014-12-26 DIAGNOSIS — E11621 Type 2 diabetes mellitus with foot ulcer: Secondary | ICD-10-CM | POA: Diagnosis not present

## 2014-12-26 DIAGNOSIS — G4730 Sleep apnea, unspecified: Secondary | ICD-10-CM | POA: Diagnosis not present

## 2014-12-26 DIAGNOSIS — L97521 Non-pressure chronic ulcer of other part of left foot limited to breakdown of skin: Secondary | ICD-10-CM | POA: Diagnosis not present

## 2014-12-26 DIAGNOSIS — M869 Osteomyelitis, unspecified: Secondary | ICD-10-CM | POA: Diagnosis not present

## 2014-12-26 LAB — GLUCOSE, CAPILLARY
GLUCOSE-CAPILLARY: 270 mg/dL — AB (ref 65–99)
Glucose-Capillary: 232 mg/dL — ABNORMAL HIGH (ref 65–99)

## 2014-12-29 DIAGNOSIS — L97521 Non-pressure chronic ulcer of other part of left foot limited to breakdown of skin: Secondary | ICD-10-CM | POA: Diagnosis not present

## 2014-12-29 DIAGNOSIS — M869 Osteomyelitis, unspecified: Secondary | ICD-10-CM | POA: Diagnosis not present

## 2014-12-29 DIAGNOSIS — G4730 Sleep apnea, unspecified: Secondary | ICD-10-CM | POA: Diagnosis not present

## 2014-12-29 DIAGNOSIS — E11621 Type 2 diabetes mellitus with foot ulcer: Secondary | ICD-10-CM | POA: Diagnosis not present

## 2014-12-29 LAB — GLUCOSE, CAPILLARY
GLUCOSE-CAPILLARY: 328 mg/dL — AB (ref 65–99)
Glucose-Capillary: 334 mg/dL — ABNORMAL HIGH (ref 65–99)

## 2015-01-05 DIAGNOSIS — L97521 Non-pressure chronic ulcer of other part of left foot limited to breakdown of skin: Secondary | ICD-10-CM | POA: Diagnosis not present

## 2015-01-05 DIAGNOSIS — M869 Osteomyelitis, unspecified: Secondary | ICD-10-CM | POA: Diagnosis not present

## 2015-01-05 DIAGNOSIS — E11621 Type 2 diabetes mellitus with foot ulcer: Secondary | ICD-10-CM | POA: Diagnosis not present

## 2015-01-05 DIAGNOSIS — G4730 Sleep apnea, unspecified: Secondary | ICD-10-CM | POA: Diagnosis not present

## 2015-01-05 LAB — GLUCOSE, CAPILLARY
GLUCOSE-CAPILLARY: 206 mg/dL — AB (ref 65–99)
GLUCOSE-CAPILLARY: 249 mg/dL — AB (ref 65–99)

## 2015-01-06 ENCOUNTER — Encounter (HOSPITAL_BASED_OUTPATIENT_CLINIC_OR_DEPARTMENT_OTHER): Payer: BLUE CROSS/BLUE SHIELD | Attending: General Surgery

## 2015-01-06 DIAGNOSIS — I2510 Atherosclerotic heart disease of native coronary artery without angina pectoris: Secondary | ICD-10-CM | POA: Diagnosis not present

## 2015-01-06 DIAGNOSIS — E11622 Type 2 diabetes mellitus with other skin ulcer: Secondary | ICD-10-CM | POA: Diagnosis present

## 2015-01-06 DIAGNOSIS — I509 Heart failure, unspecified: Secondary | ICD-10-CM | POA: Insufficient documentation

## 2015-01-06 DIAGNOSIS — G4730 Sleep apnea, unspecified: Secondary | ICD-10-CM | POA: Insufficient documentation

## 2015-01-06 DIAGNOSIS — L97521 Non-pressure chronic ulcer of other part of left foot limited to breakdown of skin: Secondary | ICD-10-CM | POA: Diagnosis not present

## 2015-01-06 DIAGNOSIS — I110 Hypertensive heart disease with heart failure: Secondary | ICD-10-CM | POA: Insufficient documentation

## 2015-01-06 LAB — GLUCOSE, CAPILLARY
Glucose-Capillary: 153 mg/dL — ABNORMAL HIGH (ref 65–99)
Glucose-Capillary: 168 mg/dL — ABNORMAL HIGH (ref 65–99)

## 2015-01-07 DIAGNOSIS — E11622 Type 2 diabetes mellitus with other skin ulcer: Secondary | ICD-10-CM | POA: Diagnosis not present

## 2015-01-07 LAB — GLUCOSE, CAPILLARY
GLUCOSE-CAPILLARY: 216 mg/dL — AB (ref 65–99)
Glucose-Capillary: 184 mg/dL — ABNORMAL HIGH (ref 65–99)

## 2015-01-08 DIAGNOSIS — E11622 Type 2 diabetes mellitus with other skin ulcer: Secondary | ICD-10-CM | POA: Diagnosis not present

## 2015-01-08 DIAGNOSIS — I110 Hypertensive heart disease with heart failure: Secondary | ICD-10-CM | POA: Diagnosis not present

## 2015-01-08 DIAGNOSIS — L97521 Non-pressure chronic ulcer of other part of left foot limited to breakdown of skin: Secondary | ICD-10-CM | POA: Diagnosis not present

## 2015-01-08 DIAGNOSIS — G4730 Sleep apnea, unspecified: Secondary | ICD-10-CM | POA: Diagnosis not present

## 2015-01-08 LAB — GLUCOSE, CAPILLARY
GLUCOSE-CAPILLARY: 161 mg/dL — AB (ref 65–99)
Glucose-Capillary: 193 mg/dL — ABNORMAL HIGH (ref 65–99)

## 2015-01-09 DIAGNOSIS — L97521 Non-pressure chronic ulcer of other part of left foot limited to breakdown of skin: Secondary | ICD-10-CM | POA: Diagnosis not present

## 2015-01-09 DIAGNOSIS — G4730 Sleep apnea, unspecified: Secondary | ICD-10-CM | POA: Diagnosis not present

## 2015-01-09 DIAGNOSIS — I110 Hypertensive heart disease with heart failure: Secondary | ICD-10-CM | POA: Diagnosis not present

## 2015-01-09 DIAGNOSIS — E11622 Type 2 diabetes mellitus with other skin ulcer: Secondary | ICD-10-CM | POA: Diagnosis not present

## 2015-01-09 LAB — GLUCOSE, CAPILLARY
GLUCOSE-CAPILLARY: 190 mg/dL — AB (ref 65–99)
Glucose-Capillary: 189 mg/dL — ABNORMAL HIGH (ref 65–99)

## 2015-01-27 ENCOUNTER — Other Ambulatory Visit: Payer: Self-pay | Admitting: *Deleted

## 2015-01-27 MED ORDER — RABEPRAZOLE SODIUM 20 MG PO TBEC: 20.0000 mg | DELAYED_RELEASE_TABLET | Freq: Two times a day (BID) | ORAL | Status: DC | PRN

## 2015-01-28 ENCOUNTER — Other Ambulatory Visit: Payer: Self-pay | Admitting: *Deleted

## 2015-01-28 MED ORDER — FUROSEMIDE 40 MG PO TABS: ORAL_TABLET | ORAL | Status: DC

## 2015-02-12 ENCOUNTER — Encounter: Payer: Self-pay | Admitting: Adult Health

## 2015-02-12 ENCOUNTER — Ambulatory Visit (INDEPENDENT_AMBULATORY_CARE_PROVIDER_SITE_OTHER): Payer: BLUE CROSS/BLUE SHIELD | Admitting: Adult Health

## 2015-02-12 ENCOUNTER — Ambulatory Visit: Payer: BLUE CROSS/BLUE SHIELD | Admitting: Adult Health

## 2015-02-12 VITALS — BP 112/71 | HR 66 | Temp 97.7°F | Ht 70.5 in | Wt 226.0 lb

## 2015-02-12 DIAGNOSIS — I5032 Chronic diastolic (congestive) heart failure: Secondary | ICD-10-CM

## 2015-02-12 DIAGNOSIS — G4730 Sleep apnea, unspecified: Secondary | ICD-10-CM

## 2015-02-12 DIAGNOSIS — D869 Sarcoidosis, unspecified: Secondary | ICD-10-CM | POA: Diagnosis not present

## 2015-02-12 DIAGNOSIS — I2510 Atherosclerotic heart disease of native coronary artery without angina pectoris: Secondary | ICD-10-CM

## 2015-02-12 NOTE — Assessment & Plan Note (Signed)
Controlled on CPAP  Download requested

## 2015-02-12 NOTE — Assessment & Plan Note (Signed)
Stable without flare  Cont on prednisone 10mg  daily  Steroid discussion

## 2015-02-12 NOTE — Patient Instructions (Addendum)
Low salt diet  Remain on prednisone 10mg  daily .  CPAP download requested .  Do not drive if sleepy.  Wear CPAP at least 6 hrs each night  Work on weight loss.  Follow up in 3-4  months with Dr. Elsworth Soho  And As needed

## 2015-02-12 NOTE — Progress Notes (Signed)
   Subjective:    Patient ID: [name], male    DOB: 05/18/1945, 69 y.o.   MRN: [id number] HPI  69 yo male with pulmonary sarcoid on chronic steroids , Diastolic Dysfunction -GR 2 and OSA on nocturnal CPAP   02/12/2015 Follow up : Sarcoid /OSA /Diastolic Dysfunction  Pt returns for 3 month follow up .  He says overall he is doing well.   Is having trouble with poor healing toe ,finished hyperbaracic therapy.  Says it is finally healed.   Remains on prednisone 10mg  daily for Sarcoid .  Has tried to decrease dose several times in past without success in lower doses.  No flare of cough or wheezing .  Discussed yearly eye exams.   On CPAP At bedtime  . Wears all night for at least 6hrs  Feels rested , no sign daytime sleepiness.  Discussed not driving if sleepy and wt loss.  Download requested.   He has Diastolic CHF , gr 2 on last echo .  Takes lasix 80mg  Twice daily   Wt remains lower. He is working hard on diet  Down 35-40lbs over last year.   PVX and Prevnar are utd.     Review of Systems  Constitutional:   No  weight loss, night sweats,  Fevers, chills, fatigue, or  lassitude.  HEENT:   No headaches,  Difficulty swallowing,  Tooth/dental problems, or  Sore throat,                No sneezing, itching, ear ache, nasal congestion, post nasal drip,   CV:  No chest pain,  Orthopnea, PND,   anasarca, dizziness, palpitations, syncope.   GI  No heartburn, indigestion, abdominal pain, nausea, vomiting, diarrhea, change in bowel habits, loss of appetite, bloody stools.   Resp:    No wheezing.  No chest wall deformity  Skin: no rash or lesions.  GU: no dysuria, change in color of urine, no urgency or frequency.  No flank pain, no hematuria   MS:  No joint pain or swelling.  No decreased range of motion.  No back pain.  Psych:  No change in mood or affect. No depression or anxiety.  No memory loss.         Objective:   Physical Exam  GEN: A/Ox3; pleasant , NAD,  overweight ,   Vital sign reviewed   HEENT:  [place]/AT,  EACs-clear, TMs-wnl, NOSE-clear, THROAT-clear, no lesions, no postnasal drip or exudate noted.   NECK:  Supple w/ fair ROM; no JVD; normal carotid impulses w/o bruits; no thyromegaly or nodules palpated; no lymphadenopathy.  RESP  Clear  P & A; w/o, wheezes/ rales/ or rhonchi.no accessory muscle use, no dullness to percussion  CARD:  RRR, no m/r/g  , tr  peripheral edema, pulses intact, no cyanosis or clubbing.  GI:   Soft & nt; nml bowel sounds; no organomegaly or masses detected.  Musco: Warm bil, no deformities or joint swelling noted.   Neuro: alert, no focal deficits noted.    Skin: Warm, no lesions or rashes         Assessment & Plan:

## 2015-02-12 NOTE — Assessment & Plan Note (Signed)
Compensated on present regimen.   

## 2015-03-10 NOTE — Progress Notes (Signed)
Reviewed & agree with plan  

## 2015-04-03 ENCOUNTER — Other Ambulatory Visit: Payer: Self-pay | Admitting: Adult Health

## 2015-04-26 ENCOUNTER — Other Ambulatory Visit: Payer: Self-pay | Admitting: Adult Health

## 2015-05-08 ENCOUNTER — Other Ambulatory Visit: Payer: Self-pay | Admitting: Adult Health

## 2015-05-13 ENCOUNTER — Telehealth: Payer: Self-pay | Admitting: Adult Health

## 2015-05-13 NOTE — Telephone Encounter (Signed)
RX was refilled today by nurse. [facility] and confirmed this was received. Nothing further needed

## 2015-05-15 ENCOUNTER — Other Ambulatory Visit: Payer: Self-pay | Admitting: Adult Health

## 2015-05-15 ENCOUNTER — Other Ambulatory Visit: Payer: Self-pay | Admitting: *Deleted

## 2015-05-15 MED ORDER — PREDNISONE 10 MG PO TABS: 10.0000 mg | ORAL_TABLET | Freq: Every day | ORAL | Status: DC

## 2015-06-08 ENCOUNTER — Other Ambulatory Visit: Payer: Self-pay | Admitting: Adult Health

## 2015-06-25 ENCOUNTER — Ambulatory Visit (INDEPENDENT_AMBULATORY_CARE_PROVIDER_SITE_OTHER): Payer: BLUE CROSS/BLUE SHIELD | Admitting: Pulmonary Disease

## 2015-06-25 ENCOUNTER — Encounter: Payer: Self-pay | Admitting: Pulmonary Disease

## 2015-06-25 ENCOUNTER — Ambulatory Visit (HOSPITAL_BASED_OUTPATIENT_CLINIC_OR_DEPARTMENT_OTHER)
Admission: RE | Admit: 2015-06-25 | Discharge: 2015-06-25 | Disposition: A | Discharge status: Home / Self Care | Payer: BLUE CROSS/BLUE SHIELD | Source: Ambulatory Visit | Attending: Pulmonary Disease | Admitting: Pulmonary Disease

## 2015-06-25 VITALS — BP 108/64 | HR 62 | Ht 70.5 in | Wt 230.0 lb

## 2015-06-25 DIAGNOSIS — G4730 Sleep apnea, unspecified: Secondary | ICD-10-CM | POA: Diagnosis not present

## 2015-06-25 DIAGNOSIS — D860 Sarcoidosis of lung: Secondary | ICD-10-CM | POA: Diagnosis present

## 2015-06-25 DIAGNOSIS — I5032 Chronic diastolic (congestive) heart failure: Secondary | ICD-10-CM | POA: Diagnosis not present

## 2015-06-25 IMAGING — DX DG CHEST 2V
2 series · 2 of 2 positions shown · non-contrast
Comparison: [DATE]

CLINICAL DATA: Pulmonary sarcoid.  No current symptoms.

EXAM:
CHEST  2 VIEW

[chest pa]
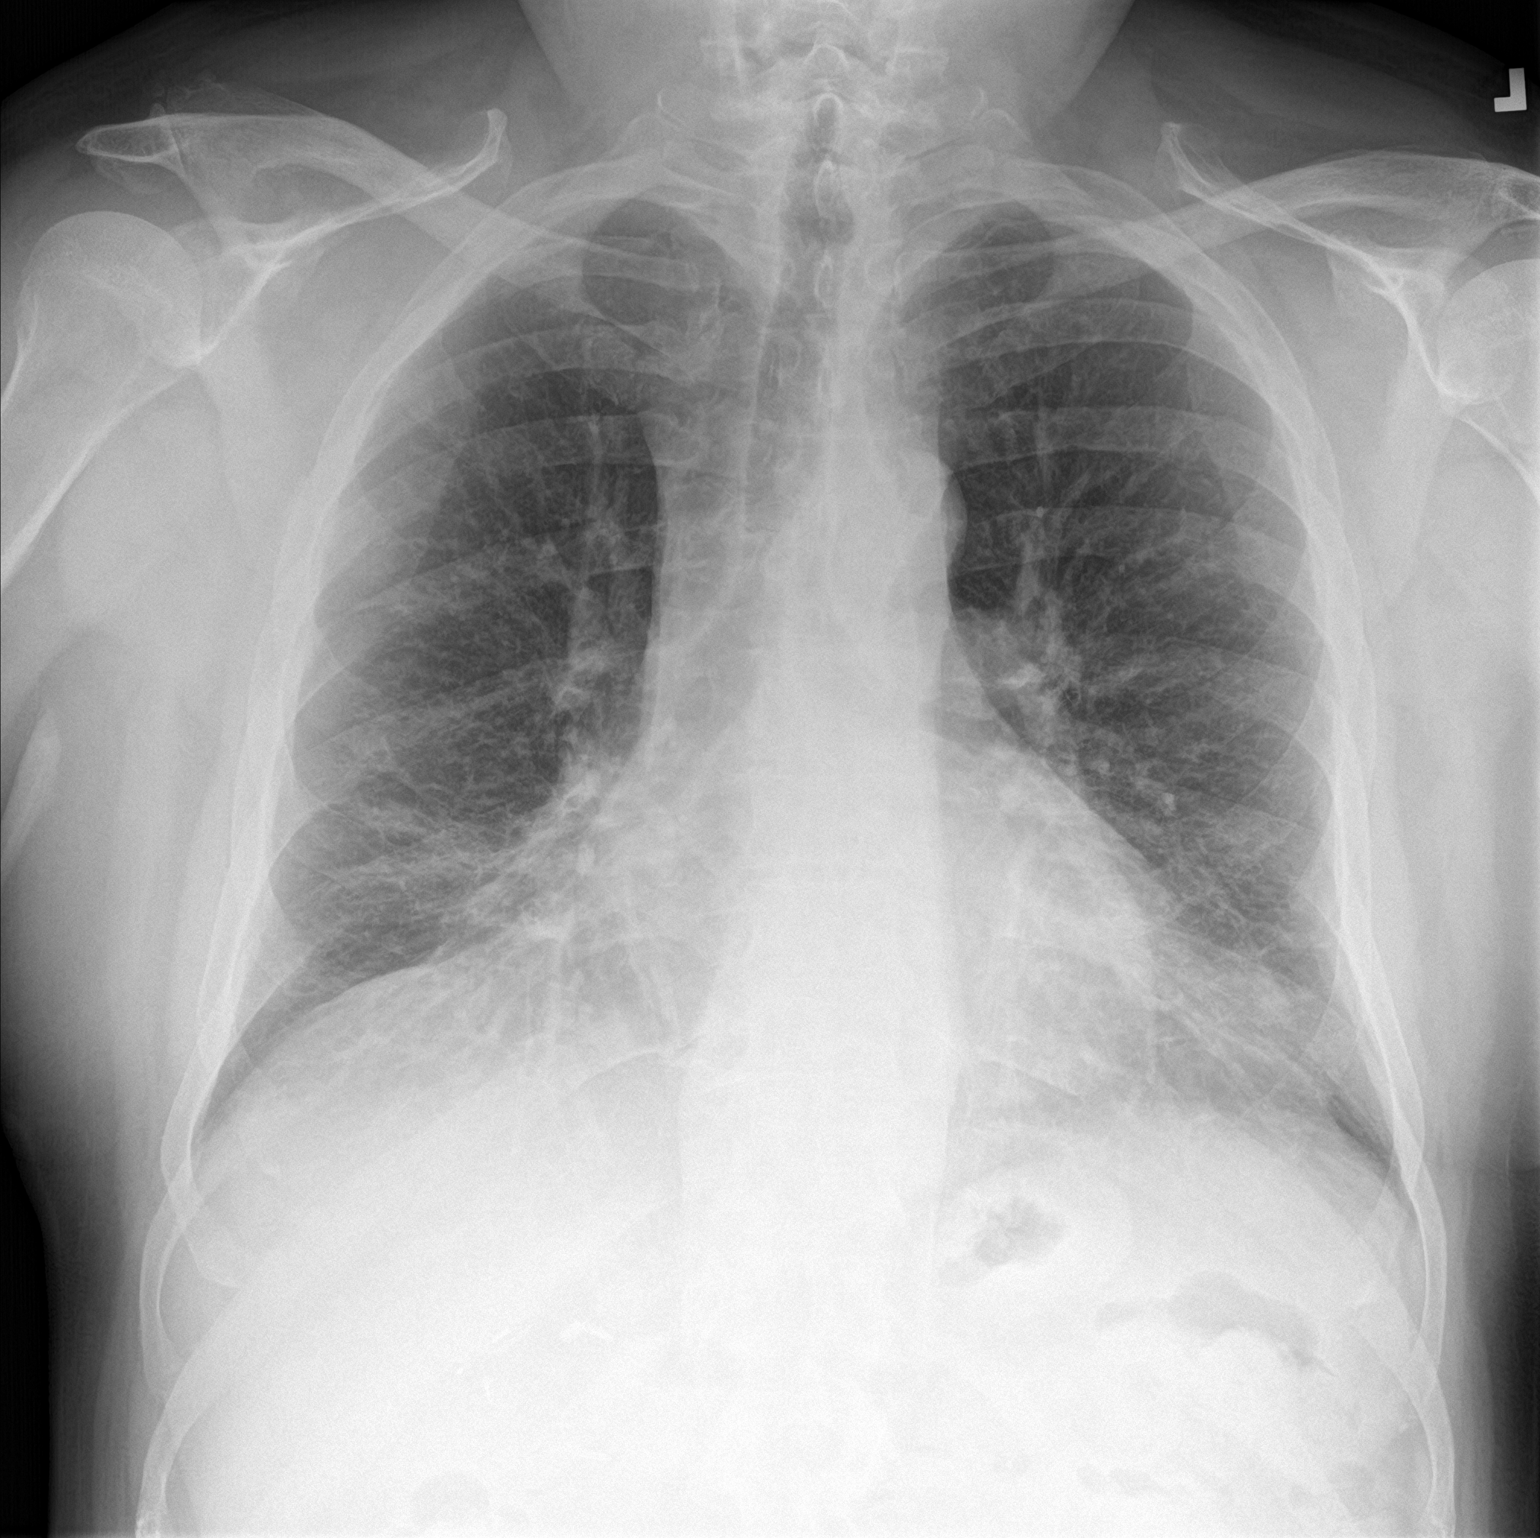

[chest lat]
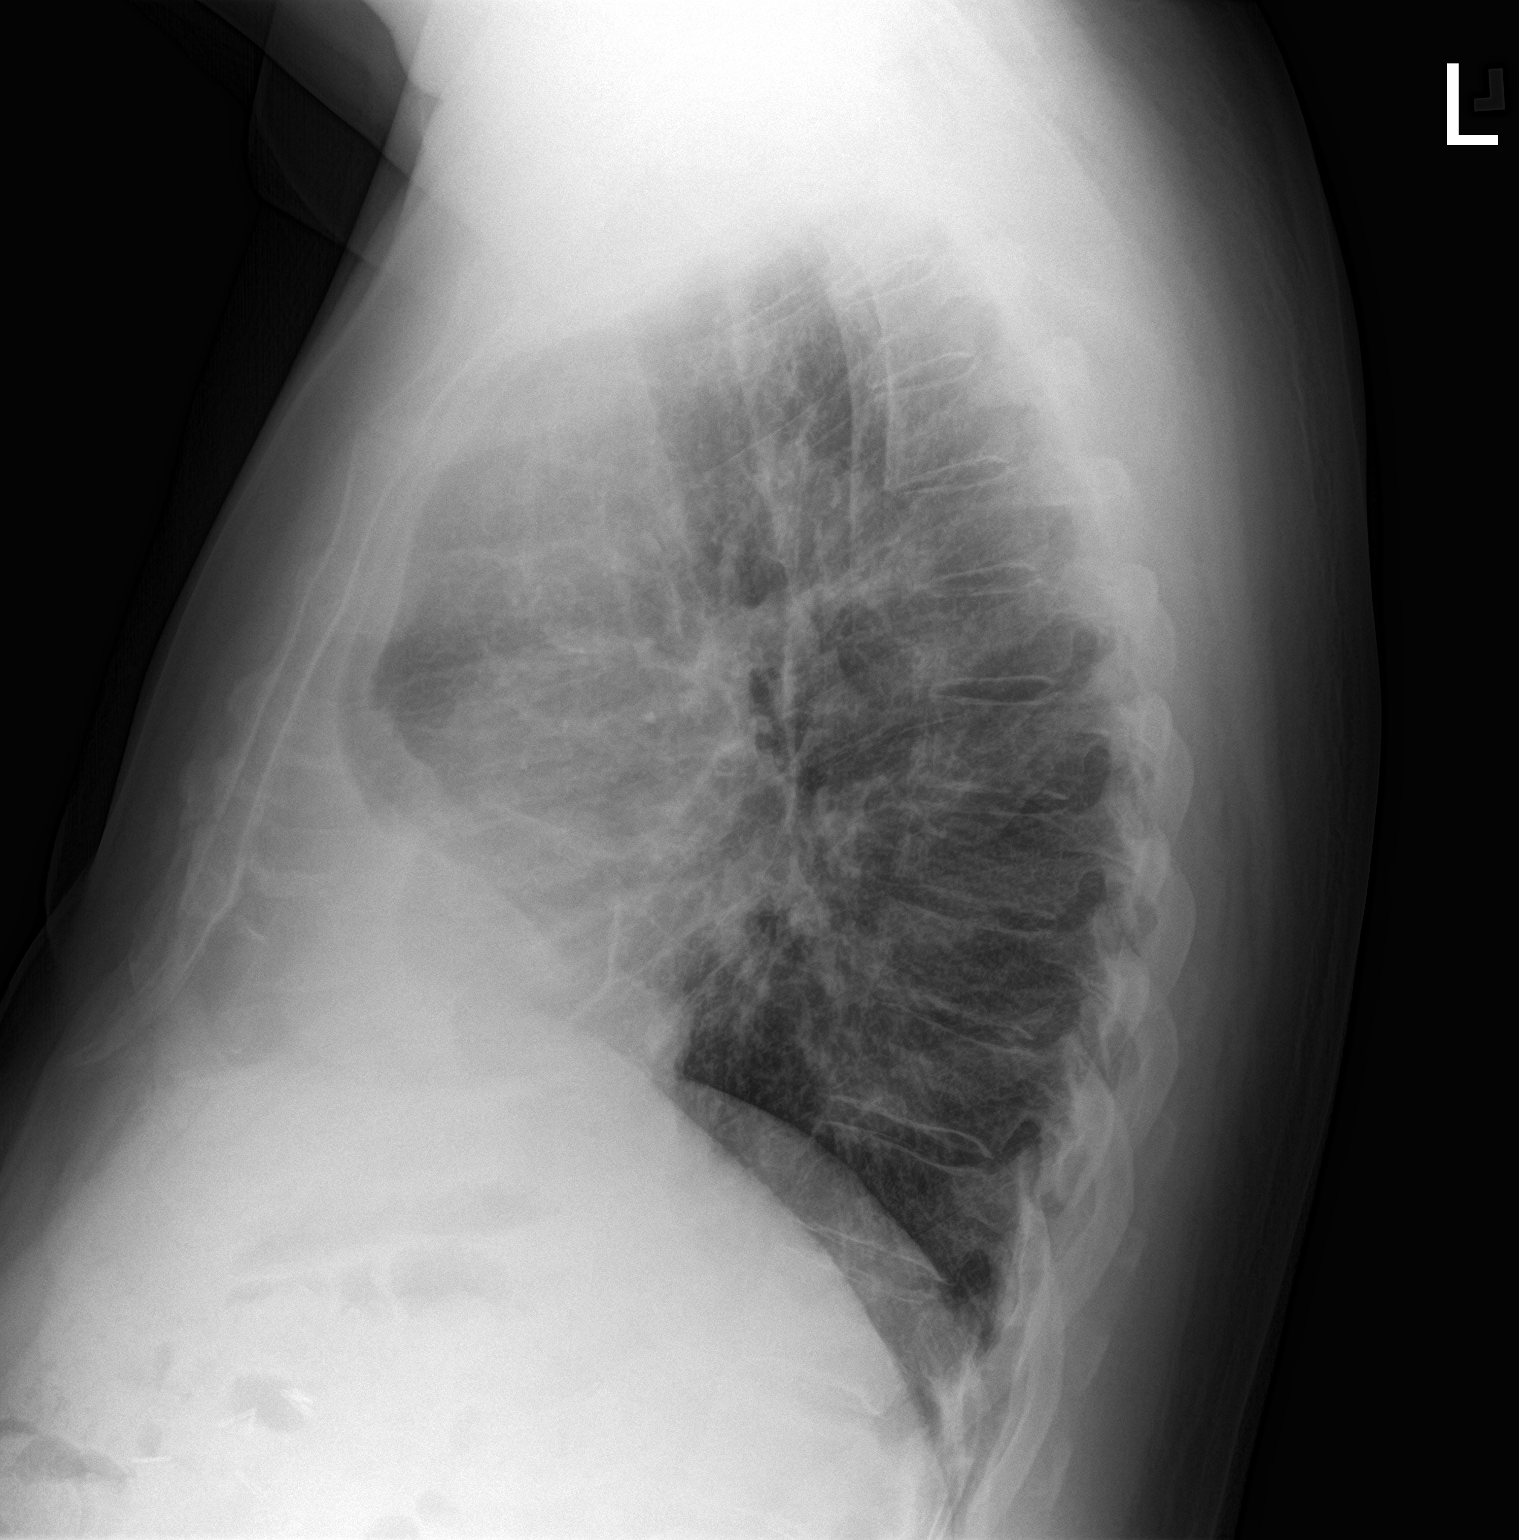

[2 of 2 positions shown; findings below may reference images not displayed]

FINDINGS: Stable fine reticular opacities greatest at the right base and
consistent with scarring. The right cardiophrenic sulcus is blunted
by fat pad seen on [Q6] CT. No apical fibrotic changes or volume
loss. There is no edema, consolidation, effusion, or pneumothorax.
Normal heart size and aortic contours. No indication of adenopathy.
IMPRESSION: No active disease or change since [DATE].

## 2015-06-25 MED ORDER — RABEPRAZOLE SODIUM 20 MG PO TBEC: 20.0000 mg | DELAYED_RELEASE_TABLET | Freq: Two times a day (BID) | ORAL | Status: DC | PRN

## 2015-06-25 MED ORDER — FUROSEMIDE 40 MG PO TABS: ORAL_TABLET | ORAL | Status: DC

## 2015-06-25 NOTE — Patient Instructions (Signed)
Chest x-ray today Obtain full PFTs   April -Drop prednisone to 5 mg on Monday, 10 mg on other days  May- drop prednisone to 5 mg on Monday and Friday  June-drop to 5 mg on Monday/Wednesday/Friday  CPAP download will be obtained

## 2015-06-25 NOTE — Addendum Note (Signed)
Addended by: Mathis Dad on: 06/25/2015 04:24 PM   Modules accepted: Orders

## 2015-06-25 NOTE — Assessment & Plan Note (Signed)
Refills on Lasix weigh himself at least twice a week

## 2015-06-25 NOTE — Progress Notes (Signed)
   Subjective:    Patient ID: [name], male    DOB: [birth year], 70 y.o.   MRN: [id number]  HPI 70 y.o. white male with a history of severe pulmonary sarcoidosis with mediastinal and hilar lymph node & parenchymal involvement & severe OSA.   Failed Arava and Imuran due to side effects.    06/25/2015  Chief Complaint  Patient presents with  . Follow-up    doing well on CPAP, wears every night, approx6-8 hours nightly.  Having sinus issues, head stuffy.    Has been maintained on steroids since 2006 since diagnosis Takes Lasix 80 twice a day for chronic diastolic heart failure -Sometimes only once daily His breathing has been stable  He is compliant with his CPAP, no problems with pressure or dryness, he is getting his supplies on time, denies daytime somnolence, wife has not noted any snoring  He is thinking of retiring in 2 months  Significant tests/ events  2006 EUS subcarinal LN FNA >> Granulomas  02/2014 - echo gr 2 diast dysfn CT chest 02/2014 >> stable linear scarring in both lung bases and mild right lung nodularity  08/2008 FVC 74%, DLCO 75%  PSG 09/2008 AHI 37/h   Review of Systems Patient denies significant dyspnea,cough, hemoptysis,  chest pain, palpitations, pedal edema, orthopnea, paroxysmal nocturnal dyspnea, lightheadedness, nausea, vomiting, abdominal or  leg pains      Objective:   Physical Exam  Gen. Pleasant, obese, in no distress ENT - no lesions, no post nasal drip Neck: No JVD, no thyromegaly, no carotid bruits Lungs: no use of accessory muscles, no dullness to percussion, decreased without rales or rhonchi  Cardiovascular: Rhythm regular, heart sounds  normal, no murmurs or gallops, no peripheral edema Musculoskeletal: No deformities, no cyanosis or clubbing , no tremors       Assessment & Plan:

## 2015-06-25 NOTE — Assessment & Plan Note (Signed)
CPAP download will be obtained  Weight loss encouraged, compliance with goal of at least 4-6 hrs every night is the expectation. Advised against medications with sedative side effects Cautioned against driving when sleepy - understanding that sleepiness will vary on a day to day basis

## 2015-06-25 NOTE — Assessment & Plan Note (Addendum)
Not sure why he has been steroid dependent for some many years-lung function does not seem bad,   Chest x-ray today Obtain full PFTs   April -Drop prednisone to 5 mg on Monday, 10 mg on other days  May- drop prednisone to 5 mg on Monday and Friday  June-drop to 5 mg on Monday/Wednesday/Friday

## 2015-06-29 ENCOUNTER — Ambulatory Visit (INDEPENDENT_AMBULATORY_CARE_PROVIDER_SITE_OTHER): Payer: BLUE CROSS/BLUE SHIELD | Admitting: Cardiology

## 2015-06-29 ENCOUNTER — Encounter: Payer: Self-pay | Admitting: Cardiology

## 2015-06-29 VITALS — BP 138/70 | HR 57 | Ht 70.5 in | Wt 229.4 lb

## 2015-06-29 DIAGNOSIS — I5032 Chronic diastolic (congestive) heart failure: Secondary | ICD-10-CM

## 2015-06-29 DIAGNOSIS — E669 Obesity, unspecified: Secondary | ICD-10-CM | POA: Diagnosis not present

## 2015-06-29 DIAGNOSIS — E785 Hyperlipidemia, unspecified: Secondary | ICD-10-CM | POA: Diagnosis not present

## 2015-06-29 DIAGNOSIS — I2510 Atherosclerotic heart disease of native coronary artery without angina pectoris: Secondary | ICD-10-CM | POA: Diagnosis not present

## 2015-06-29 DIAGNOSIS — D860 Sarcoidosis of lung: Secondary | ICD-10-CM

## 2015-06-29 NOTE — Progress Notes (Signed)
[facility]. [address] [place], [place]  [address] Phone: [phone] Fax:  [phone]  Date:  06/29/2015   ID:  [name], [name] 09-22-1945, MRN [id number]  PCP:  Shirline Frees, MD   History of Present Illness: [name] is a 70 y.o. male with coronary artery disease, drug eluting stent to LAD and posterior lateral branch 10/2009, cholecystectomy November 2012, history of pulmonary sarcoid. Cardiac catheterization 10/12/11 that revealed patent LAD, small vessels with normal left ventricular function of 55%, increased left ventricular end-diastolic pressure. Subsequent catheterization was performed on 04/01/14 which resulted in continued medical management. There was moderate diffuse disease in the small caliber distal LAD segment. He has had chronic symptoms of shortness of breath,  decreased endurance which has recently improved with weight loss.   No evidence of increase sarcoidosis on CT scan. Difficult diabetes. Dr. Joya Gaskins on chronic prednisone therapy which seemed to help with lower extremity discomfort.  Wife surgery stroke after neck surgery. Daughter killed in tractor accident.  In the past, not able to tolerate beta blockers or statins.   We discussed his hearing aids.   Wt Readings from Last 3 Encounters:  06/29/15 229 lb 6.4 oz (104.055 kg)  06/25/15 230 lb (104.327 kg)  02/12/15 226 lb (102.513 kg)     Past Medical History  Diagnosis Date  . Type 2 diabetes mellitus ([place])   . Sarcoid ([place])   . Adrenal insufficiency ([place])   . Hypertension   . OSA (obstructive sleep apnea)     uses VPAC sleep study 2 years done through [facility]. Dr. Marlou Porch arranged study  . Coronary artery disease     has stents  . Heart attack ([place]) 2010  . Pneumonia     3-4 years ago  . Diabetes mellitus     insulin and pills  . Coronary atherosclerosis of native coronary artery     Proximal LAD, posterior lateral stent widely patent-10/12/11    Past Surgical History  Procedure  Laterality Date  . Coronary stent placement  2009    in LAD and side branch PTCA  . Cataract extraction  2011    bilat  . Intercostal nerve block  2011    x2. lumbar spine  . Appendectomy    . Coronary angioplasty      most recent 11/2009  . Cholecystectomy  01/25/2011    Procedure: LAPAROSCOPIC CHOLECYSTECTOMY WITH INTRAOPERATIVE CHOLANGIOGRAM;  Surgeon: Judieth Keens, DO;  Location: [facility];  Service: General;  Laterality: N/A;  . Left heart catheterization with coronary angiogram Bilateral 10/12/2011    Procedure: LEFT HEART CATHETERIZATION WITH CORONARY ANGIOGRAM;  Surgeon: Candee Furbish, MD;  Location: [facility] CATH LAB;  Service: Cardiovascular;  Laterality: Bilateral;  . Left heart catheterization with coronary angiogram N/A 04/01/2014    Procedure: LEFT HEART CATHETERIZATION WITH CORONARY ANGIOGRAM;  Surgeon: Candee Furbish, MD;  Location: [facility] CATH LAB;  Service: Cardiovascular;  Laterality: N/A;    Current Outpatient Prescriptions  Medication Sig Dispense Refill  . acetaminophen (TYLENOL) 500 MG tablet Take 500 mg by mouth daily.    Marland Kitchen albuterol (PROVENTIL) (2.5 MG/3ML) 0.083% nebulizer solution Take 3 mLs (2.5 mg total) by nebulization every 6 (six) hours as needed for wheezing or shortness of breath. Dx Code D86.0 360 mL 5  . Albuterol Sulfate (PROAIR RESPICLICK) 123XX123 (90 BASE) MCG/ACT AEPB Inhale 2 puffs into the lungs every 6 (six) hours as needed. 1 each 6  . aspirin EC 81  MG tablet Take 81 mg by mouth daily.    . Cholecalciferol (VITAMIN D3) 3000 UNITS TABS Take 3,000 Units by mouth daily.    . clopidogrel (PLAVIX) 75 MG tablet Take 1 tablet (75 mg total) by mouth daily. 90 tablet 4  . Coenzyme Q10 (COQ10 MAXIMUM STRENGTH PO) Take 1 tablet by mouth daily with lunch.    . fish oil-omega-3 fatty acids 1000 MG capsule Take 3 g by mouth daily with lunch. Omega X - 3    . furosemide (LASIX) 40 MG tablet TAKE 2 TABLETS BY MOUTH EVERY MORNING AND 2 TABLETS EVERY EVENING 360 tablet 0  .  glipiZIDE (GLUCOTROL) 10 MG tablet Take 20 mg by mouth 2 (two) times daily.      . insulin aspart protamine-insulin aspart (NOVOLOG 70/30) (70-30) 100 UNIT/ML injection Inject 40-50 Units into the skin 3 (three) times daily as needed (CBG >100). If blood sugar <100 0 units, 100-149 40 units, >150 units 50 units    . losartan (COZAAR) 100 MG tablet Take 100 mg by mouth daily.    . mometasone (NASONEX) 50 MCG/ACT nasal spray Place 2 sprays into the nose daily as needed (congestion). 17 g 6  . potassium chloride (KLOR-CON M10) 10 MEQ tablet Take 2 tablets (20 mEq total) by mouth 2 (two) times daily. 360 tablet 1  . predniSONE (DELTASONE) 10 MG tablet Take 1 tablet (10 mg total) by mouth daily with breakfast. T 30 tablet 6  . RABEprazole (ACIPHEX) 20 MG tablet Take 1 tablet (20 mg total) by mouth 2 (two) times daily as needed. 120 tablet 0  . Vitamin D, Ergocalciferol, (DRISDOL) 50000 UNITS CAPS capsule Take 50,000 Units by mouth every 7 (seven) days. On Sundays  1   No current facility-administered medications for this visit.    Allergies:    Allergies  Allergen Reactions  . Hydrocortisone Nausea Only  . Statins Other (See Comments)    Pt says they make him "mean"    Social History:  The patient  reports that he has never smoked. He has never used smokeless tobacco. He reports that he does not drink alcohol or use illicit drugs.   Family History  Problem Relation Age of Onset  . Asthma Sister   . Anesthesia problems Sister     "Kidney's did not wake up"  . Kidney disease Mother   . Diabetes Mother   . Kidney cancer Mother   . Cancer Mother     kidney  . Heart attack Father     ROS:  Please see the history of present illness.   Denies any syncope, bleeding, orthopnea, PND.   All other systems reviewed and negative.   PHYSICAL EXAM: VS:  BP 138/70 mmHg  Pulse 57  Ht 5' 10.5" (1.791 m)  Wt 229 lb 6.4 oz (104.055 kg)  BMI 32.44 kg/m2 Well nourished, well developed, in no acute  distress HEENT: normal, [place]/AT, EOMI, ruddy Neck: no JVD, normal carotid upstroke, no bruit Cardiac:  normal S1, S2; RRR; no murmur Lungs:  clear to auscultation bilaterally, no wheezing, rhonchi or rales Abd: soft, nontender, no hepatomegaly, no bruitsobese Ext: no edema, 2+ distal pulses Skin: warm and drythickened GU: deferred Neuro: no focal abnormalities noted, AAO x 3  EKG:  EKG is ordered today. 06/29/15-sinus bradycardia with sinus arrhythmia rate 57, left anterior fascicular block personally viewed-prior 03/03/14-sinus rhythm, LAFB, septal infarct, no ST segment changes, heart rate 84 bpm.  ASSESSMENT AND PLAN:  1. Chronic diastolic  heart failure-in the past, he has not tolerated beta blockers. Continue with other medications. Lasix has been 80 mg BID 2. Chronic stable angina/dyspnea- Prior catheterization repeat as above. Reassurance. He is feeling much better after weight loss, 50 pounds. 3. Fluid overload-much improved with salt restriction, weight loss. 4. Obesity-good job with overall weight loss. 5. Diabetes-uncontrolled-previously A1c in the 9 range. Now 6.8. Excellent. Continue to work with Dr. Kenton Kingfisher. 6. Pulmonary sarcoid-Dr. Elsworth Soho, former Dr. Joya Gaskins. Tammy Parrett.  Prednisone. Remember, he did have difficulty with leg movement when his prednisone was decreased previously. No recent increase on CT. 7. Hyperlipidemia-hypertriglyceridemia in the setting of uncontrolled diabetes.  8. We will see back in 12 months.  Signed, Candee Furbish, MD [facility]  06/29/2015 3:55 PM    .

## 2015-06-29 NOTE — Patient Instructions (Signed)

## 2015-07-01 ENCOUNTER — Telehealth: Payer: Self-pay | Admitting: Pulmonary Disease

## 2015-07-01 NOTE — Telephone Encounter (Signed)
Notes Recorded by Rigoberto Noel, MD on 06/25/2015 at 4:41 PM Mild scarring as noted earlier  LVM for pt to return call.

## 2015-07-02 NOTE — Telephone Encounter (Signed)
Spoke with pt and advised of cxr results per Dr Elsworth Soho

## 2015-07-21 ENCOUNTER — Encounter (INDEPENDENT_AMBULATORY_CARE_PROVIDER_SITE_OTHER): Payer: Self-pay | Admitting: Ophthalmology

## 2015-08-17 ENCOUNTER — Encounter (INDEPENDENT_AMBULATORY_CARE_PROVIDER_SITE_OTHER): Payer: Commercial Managed Care - HMO | Admitting: Ophthalmology

## 2015-08-17 DIAGNOSIS — H43813 Vitreous degeneration, bilateral: Secondary | ICD-10-CM

## 2015-08-17 DIAGNOSIS — I10 Essential (primary) hypertension: Secondary | ICD-10-CM | POA: Diagnosis not present

## 2015-08-17 DIAGNOSIS — E11311 Type 2 diabetes mellitus with unspecified diabetic retinopathy with macular edema: Secondary | ICD-10-CM | POA: Diagnosis not present

## 2015-08-17 DIAGNOSIS — H35033 Hypertensive retinopathy, bilateral: Secondary | ICD-10-CM

## 2015-08-17 DIAGNOSIS — E113512 Type 2 diabetes mellitus with proliferative diabetic retinopathy with macular edema, left eye: Secondary | ICD-10-CM

## 2015-08-17 DIAGNOSIS — E113591 Type 2 diabetes mellitus with proliferative diabetic retinopathy without macular edema, right eye: Secondary | ICD-10-CM

## 2015-09-28 ENCOUNTER — Other Ambulatory Visit: Payer: Self-pay | Admitting: Pulmonary Disease

## 2015-09-29 DIAGNOSIS — Z7984 Long term (current) use of oral hypoglycemic drugs: Secondary | ICD-10-CM | POA: Diagnosis not present

## 2015-09-29 DIAGNOSIS — D869 Sarcoidosis, unspecified: Secondary | ICD-10-CM | POA: Diagnosis not present

## 2015-09-29 DIAGNOSIS — D538 Other specified nutritional anemias: Secondary | ICD-10-CM | POA: Diagnosis not present

## 2015-09-29 DIAGNOSIS — E1165 Type 2 diabetes mellitus with hyperglycemia: Secondary | ICD-10-CM | POA: Diagnosis not present

## 2015-09-29 DIAGNOSIS — E559 Vitamin D deficiency, unspecified: Secondary | ICD-10-CM | POA: Diagnosis not present

## 2015-09-29 DIAGNOSIS — E039 Hypothyroidism, unspecified: Secondary | ICD-10-CM | POA: Diagnosis not present

## 2015-09-29 DIAGNOSIS — I10 Essential (primary) hypertension: Secondary | ICD-10-CM | POA: Diagnosis not present

## 2015-09-29 DIAGNOSIS — I2510 Atherosclerotic heart disease of native coronary artery without angina pectoris: Secondary | ICD-10-CM | POA: Diagnosis not present

## 2015-09-29 DIAGNOSIS — E7800 Pure hypercholesterolemia, unspecified: Secondary | ICD-10-CM | POA: Diagnosis not present

## 2015-10-03 ENCOUNTER — Other Ambulatory Visit: Payer: Self-pay | Admitting: Pulmonary Disease

## 2015-12-07 ENCOUNTER — Other Ambulatory Visit: Payer: Self-pay | Admitting: *Deleted

## 2015-12-07 MED ORDER — RABEPRAZOLE SODIUM 20 MG PO TBEC: 20.0000 mg | DELAYED_RELEASE_TABLET | Freq: Two times a day (BID) | ORAL | 0 refills | Status: DC | PRN

## 2015-12-21 ENCOUNTER — Ambulatory Visit (INDEPENDENT_AMBULATORY_CARE_PROVIDER_SITE_OTHER): Payer: Commercial Managed Care - HMO | Admitting: Ophthalmology

## 2015-12-21 DIAGNOSIS — H43813 Vitreous degeneration, bilateral: Secondary | ICD-10-CM

## 2015-12-21 DIAGNOSIS — E113391 Type 2 diabetes mellitus with moderate nonproliferative diabetic retinopathy without macular edema, right eye: Secondary | ICD-10-CM | POA: Diagnosis not present

## 2015-12-21 DIAGNOSIS — I10 Essential (primary) hypertension: Secondary | ICD-10-CM | POA: Diagnosis not present

## 2015-12-21 DIAGNOSIS — H35033 Hypertensive retinopathy, bilateral: Secondary | ICD-10-CM

## 2015-12-21 DIAGNOSIS — E11319 Type 2 diabetes mellitus with unspecified diabetic retinopathy without macular edema: Secondary | ICD-10-CM | POA: Diagnosis not present

## 2015-12-21 DIAGNOSIS — E113592 Type 2 diabetes mellitus with proliferative diabetic retinopathy without macular edema, left eye: Secondary | ICD-10-CM | POA: Diagnosis not present

## 2015-12-30 DIAGNOSIS — Z23 Encounter for immunization: Secondary | ICD-10-CM | POA: Diagnosis not present

## 2015-12-30 DIAGNOSIS — E785 Hyperlipidemia, unspecified: Secondary | ICD-10-CM | POA: Diagnosis not present

## 2015-12-30 DIAGNOSIS — I10 Essential (primary) hypertension: Secondary | ICD-10-CM | POA: Diagnosis not present

## 2015-12-30 DIAGNOSIS — E1165 Type 2 diabetes mellitus with hyperglycemia: Secondary | ICD-10-CM | POA: Diagnosis not present

## 2015-12-30 DIAGNOSIS — J0101 Acute recurrent maxillary sinusitis: Secondary | ICD-10-CM | POA: Diagnosis not present

## 2016-02-15 DIAGNOSIS — J209 Acute bronchitis, unspecified: Secondary | ICD-10-CM | POA: Diagnosis not present

## 2016-03-28 DIAGNOSIS — M791 Myalgia: Secondary | ICD-10-CM | POA: Diagnosis not present

## 2016-03-28 DIAGNOSIS — M5416 Radiculopathy, lumbar region: Secondary | ICD-10-CM | POA: Diagnosis not present

## 2016-03-31 DIAGNOSIS — I10 Essential (primary) hypertension: Secondary | ICD-10-CM | POA: Diagnosis not present

## 2016-03-31 DIAGNOSIS — E1165 Type 2 diabetes mellitus with hyperglycemia: Secondary | ICD-10-CM | POA: Diagnosis not present

## 2016-03-31 DIAGNOSIS — Z125 Encounter for screening for malignant neoplasm of prostate: Secondary | ICD-10-CM | POA: Diagnosis not present

## 2016-03-31 DIAGNOSIS — J0101 Acute recurrent maxillary sinusitis: Secondary | ICD-10-CM | POA: Diagnosis not present

## 2016-03-31 DIAGNOSIS — E7800 Pure hypercholesterolemia, unspecified: Secondary | ICD-10-CM | POA: Diagnosis not present

## 2016-03-31 DIAGNOSIS — E039 Hypothyroidism, unspecified: Secondary | ICD-10-CM | POA: Diagnosis not present

## 2016-03-31 DIAGNOSIS — M5416 Radiculopathy, lumbar region: Secondary | ICD-10-CM | POA: Diagnosis not present

## 2016-04-16 DIAGNOSIS — M545 Low back pain: Secondary | ICD-10-CM | POA: Diagnosis not present

## 2016-04-18 DIAGNOSIS — M47816 Spondylosis without myelopathy or radiculopathy, lumbar region: Secondary | ICD-10-CM | POA: Diagnosis not present

## 2016-05-12 DIAGNOSIS — M47816 Spondylosis without myelopathy or radiculopathy, lumbar region: Secondary | ICD-10-CM | POA: Diagnosis not present

## 2016-05-30 DIAGNOSIS — M47816 Spondylosis without myelopathy or radiculopathy, lumbar region: Secondary | ICD-10-CM | POA: Diagnosis not present

## 2016-06-07 ENCOUNTER — Other Ambulatory Visit: Payer: Medicare HMO | Admitting: *Deleted

## 2016-06-07 ENCOUNTER — Telehealth: Payer: Self-pay | Admitting: Cardiology

## 2016-06-07 DIAGNOSIS — I2510 Atherosclerotic heart disease of native coronary artery without angina pectoris: Secondary | ICD-10-CM | POA: Diagnosis not present

## 2016-06-07 DIAGNOSIS — R0602 Shortness of breath: Secondary | ICD-10-CM

## 2016-06-07 DIAGNOSIS — Z79899 Other long term (current) drug therapy: Secondary | ICD-10-CM

## 2016-06-07 DIAGNOSIS — R609 Edema, unspecified: Secondary | ICD-10-CM

## 2016-06-07 DIAGNOSIS — E0800 Diabetes mellitus due to underlying condition with hyperosmolarity without nonketotic hyperglycemic-hyperosmolar coma (NKHHC): Secondary | ICD-10-CM

## 2016-06-07 NOTE — Telephone Encounter (Signed)
New Message    Pt c/o Shortness Of Breath: STAT if SOB developed within the last 24 hours or pt is noticeably SOB on the phone  1. Are you currently SOB (can you hear that pt is SOB on the phone)? No  2. How long have you been experiencing SOB? About a week  3. Are you SOB when sitting or when up moving around? Moving around  4. Are you currently experiencing any other symptoms? Some dizziness

## 2016-06-07 NOTE — Telephone Encounter (Signed)
SPOKE WITH PT the patient  HAS NOTED  SOB  WITH ACTIVITY ,LEGS AND FEET ARE  SWOLLEN   AND  INCREASE IN HEART RATE  WITH VERY LITTLE ACTIVITY  ALSO  NOTES  IF  BENDS   OVER  AND STANDS UP  GETS DIZZY  PER PT   B/P  AND HEART RATE NORMAL  CURRENTLY TAKING  LASIX  40 MG  2 TABS TWICE DAILY  DISCUSSED WITH   WITH  LAURA INGOLD NP  HAVE  PT TAKE  AN EXTRA  80 MG OF LASIX TODAY AND  TOMORROW   AND  CHECK  BMET  AND BNP TODAY PT  AWARE OF  LAB ORDERS AND  MED CHANGES  WILL FOLLOW UP  WITH  PT'S CONDITION WHEN  CALL WITH LAB RESULTS .Adonis Housekeeper

## 2016-06-07 NOTE — Addendum Note (Signed)
Addended by: Eulis Foster on: 06/07/2016 01:30 PM   Modules accepted: Orders

## 2016-06-08 LAB — BASIC METABOLIC PANEL
BUN / CREAT RATIO: 20 (ref 10–24)
BUN: 28 mg/dL — AB (ref 8–27)
CO2: 29 mmol/L (ref 18–29)
CREATININE: 1.38 mg/dL — AB (ref 0.76–1.27)
Calcium: 9.1 mg/dL (ref 8.6–10.2)
Chloride: 101 mmol/L (ref 96–106)
GFR, EST AFRICAN AMERICAN: 59 mL/min/{1.73_m2} — AB (ref >59)
GFR, EST NON AFRICAN AMERICAN: 51 mL/min/{1.73_m2} — AB (ref >59)
GLUCOSE: 127 mg/dL — AB (ref 65–99)
Potassium: 3.8 mmol/L (ref 3.5–5.2)
SODIUM: 146 mmol/L — AB (ref 134–144)

## 2016-06-08 LAB — PRO B NATRIURETIC PEPTIDE: NT-PRO BNP: 220 pg/mL (ref 0–376)

## 2016-06-10 ENCOUNTER — Encounter: Payer: Self-pay | Admitting: *Deleted

## 2016-06-10 DIAGNOSIS — J069 Acute upper respiratory infection, unspecified: Secondary | ICD-10-CM | POA: Diagnosis not present

## 2016-06-10 DIAGNOSIS — R509 Fever, unspecified: Secondary | ICD-10-CM | POA: Diagnosis not present

## 2016-06-15 ENCOUNTER — Other Ambulatory Visit: Payer: Self-pay | Admitting: Family Medicine

## 2016-06-15 ENCOUNTER — Ambulatory Visit
Admission: RE | Admit: 2016-06-15 | Discharge: 2016-06-15 | Disposition: A | Discharge status: Home / Self Care | Payer: Medicare HMO | Source: Ambulatory Visit | Attending: Family Medicine | Admitting: Family Medicine

## 2016-06-15 DIAGNOSIS — R0600 Dyspnea, unspecified: Secondary | ICD-10-CM | POA: Diagnosis not present

## 2016-06-15 DIAGNOSIS — R062 Wheezing: Secondary | ICD-10-CM

## 2016-06-15 DIAGNOSIS — D869 Sarcoidosis, unspecified: Secondary | ICD-10-CM | POA: Diagnosis not present

## 2016-06-15 DIAGNOSIS — J069 Acute upper respiratory infection, unspecified: Secondary | ICD-10-CM | POA: Diagnosis not present

## 2016-06-15 DIAGNOSIS — R05 Cough: Secondary | ICD-10-CM | POA: Diagnosis not present

## 2016-06-15 IMAGING — CR DG CHEST 2V
2 series · 2 of 2 positions shown · non-contrast
Comparison: [DATE]

CLINICAL DATA: Coughing and wheezing for 1 week question pneumonia,
rare genetic lung disease per patient, type II diabetes mellitus,
hypertension, sarcoidosis, coronary artery disease

EXAM:
CHEST  2 VIEW

[w chest pa]
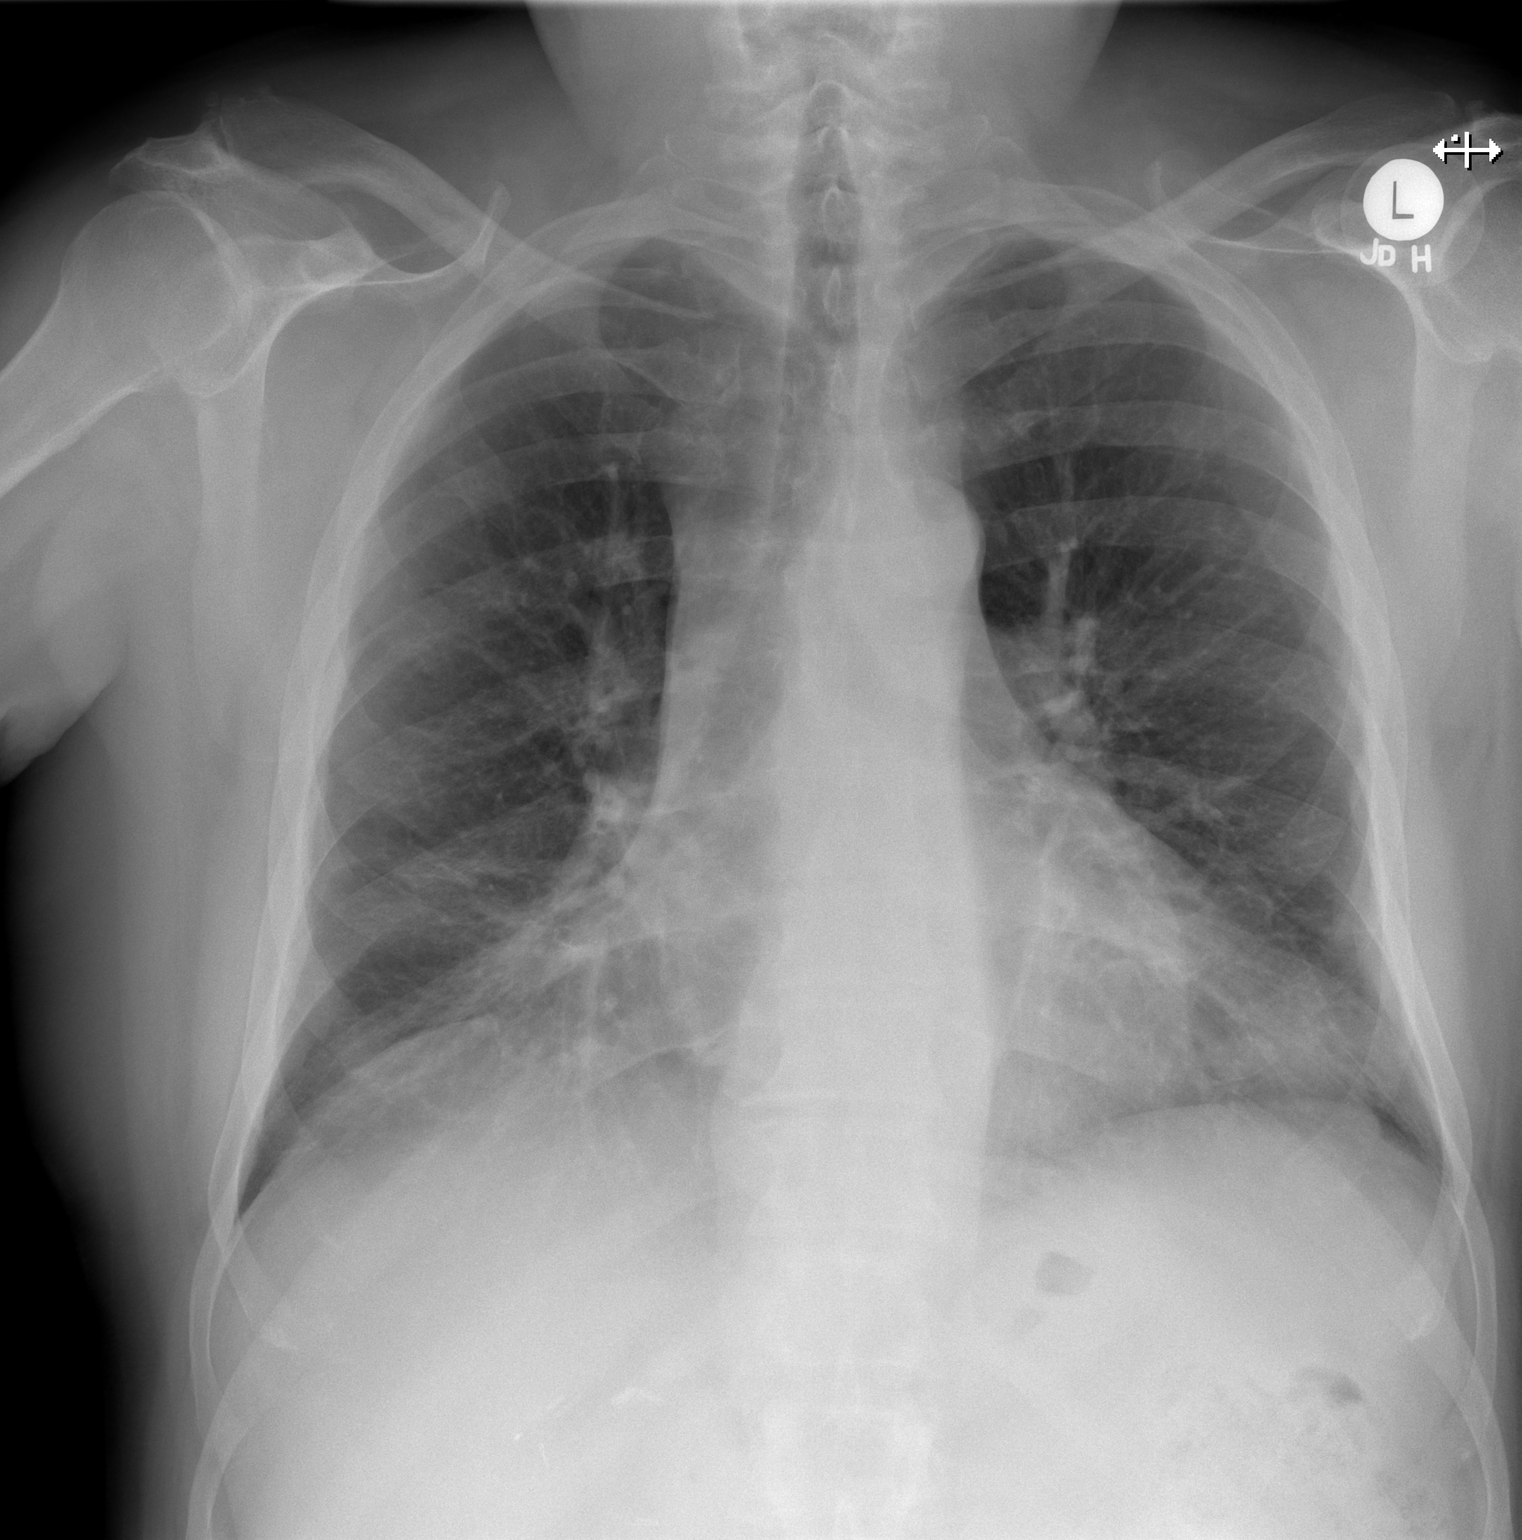

[w chest lat]
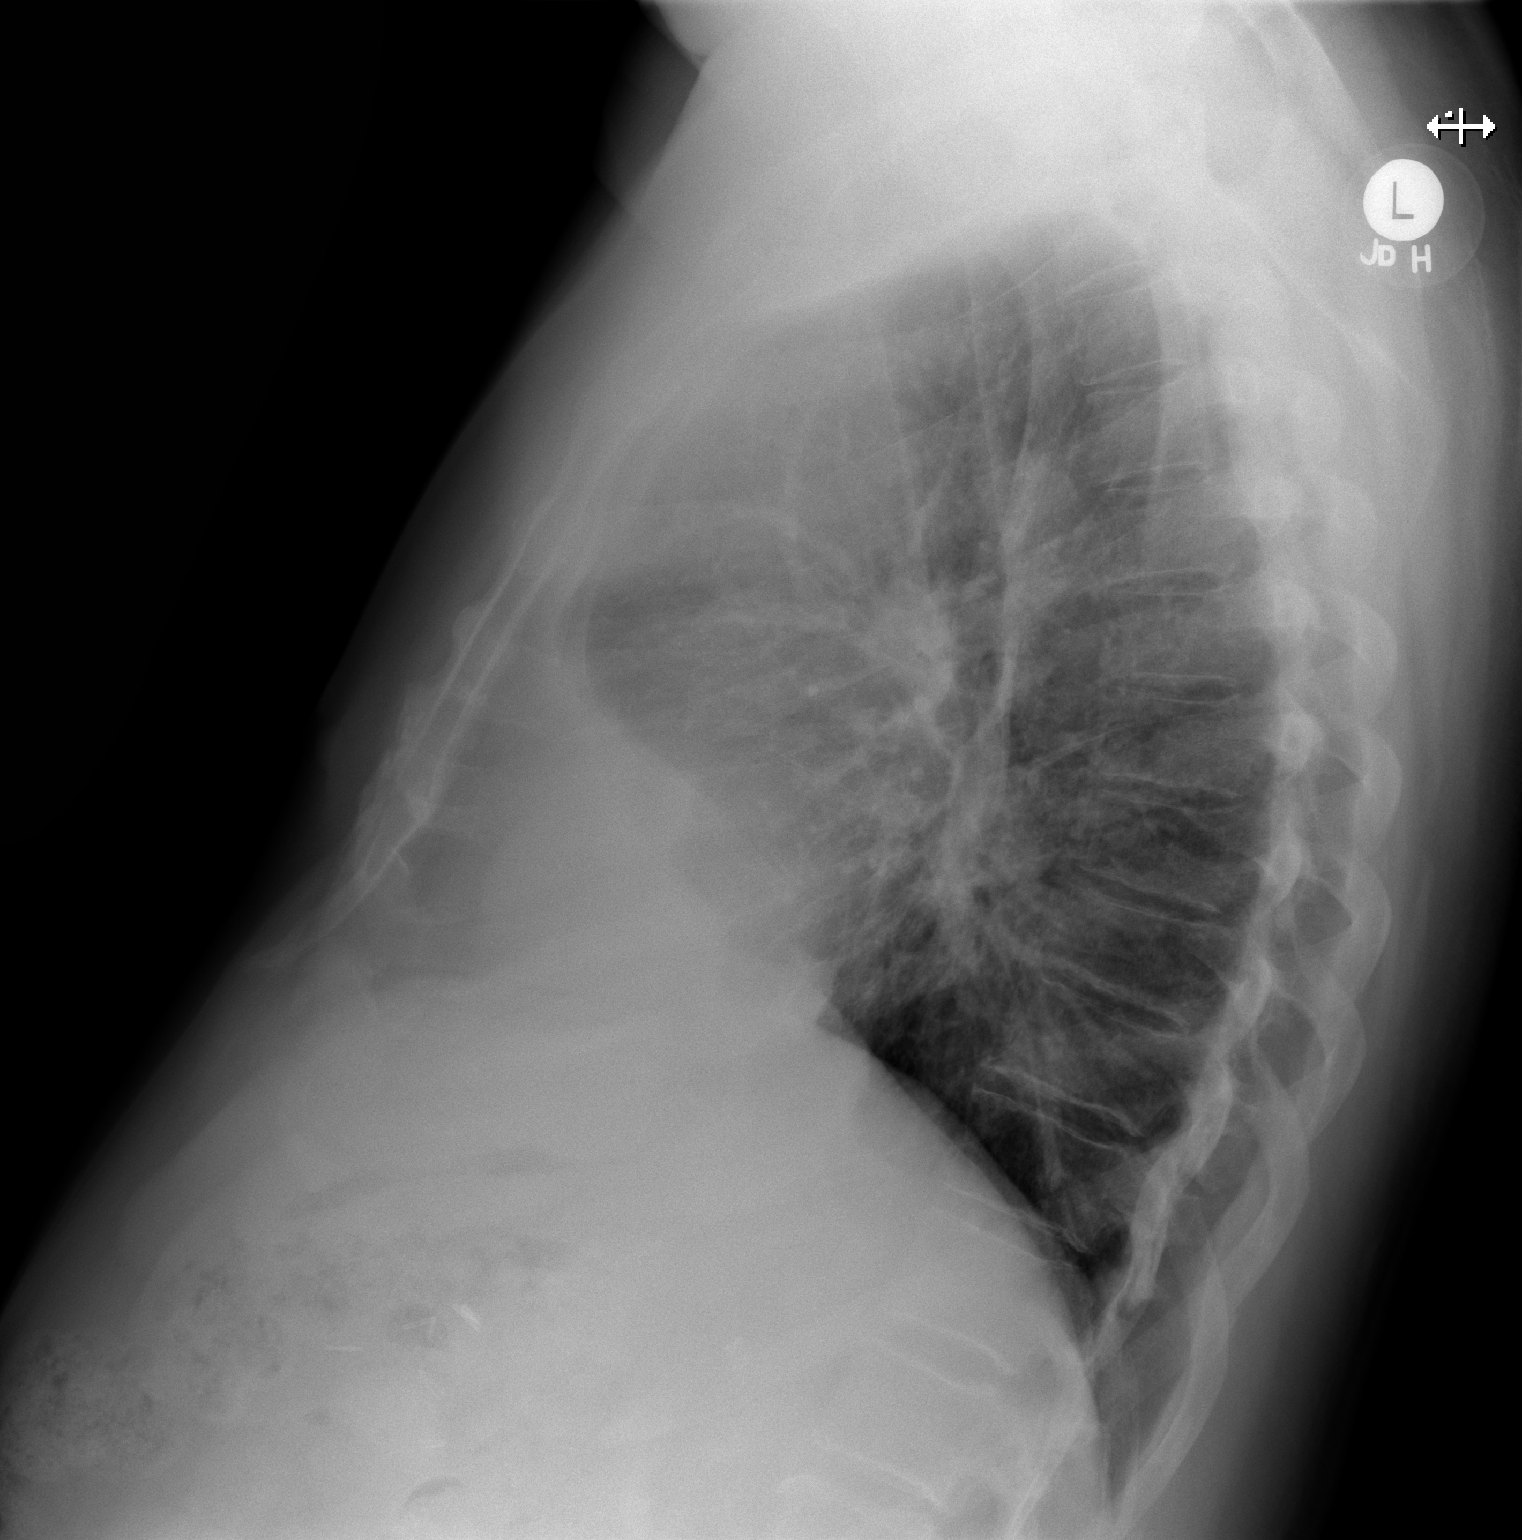

[2 of 2 positions shown; findings below may reference images not displayed]

FINDINGS: Enlargement of cardiac silhouette.

Slight pulmonary vascular congestion.

Mediastinal contours normal.

Minimal chronic central peribronchial thickening and RIGHT basilar
atelectasis.

Lungs otherwise clear.

No definite infiltrate, pleural effusion or pneumothorax.

Bones unremarkable.
IMPRESSION: Enlargement of cardiac silhouette with pulmonary vascular
congestion.

Minimal chronic bronchitic changes and RIGHT basilar atelectasis
without acute infiltrate.

## 2016-06-17 ENCOUNTER — Encounter: Payer: Self-pay | Admitting: Cardiology

## 2016-06-17 ENCOUNTER — Ambulatory Visit (INDEPENDENT_AMBULATORY_CARE_PROVIDER_SITE_OTHER): Payer: Medicare HMO | Admitting: Cardiology

## 2016-06-17 VITALS — BP 138/76 | HR 85 | Ht 70.0 in | Wt 239.0 lb

## 2016-06-17 DIAGNOSIS — I2510 Atherosclerotic heart disease of native coronary artery without angina pectoris: Secondary | ICD-10-CM | POA: Diagnosis not present

## 2016-06-17 DIAGNOSIS — R0602 Shortness of breath: Secondary | ICD-10-CM | POA: Diagnosis not present

## 2016-06-17 DIAGNOSIS — I5032 Chronic diastolic (congestive) heart failure: Secondary | ICD-10-CM

## 2016-06-17 DIAGNOSIS — D860 Sarcoidosis of lung: Secondary | ICD-10-CM | POA: Diagnosis not present

## 2016-06-17 NOTE — Progress Notes (Signed)
[facility]. [address], [address] [place], [place]  [address] Phone: [phone] Fax:  [phone]  Date:  06/17/2016   ID:  Craig, [name] December 12, 1945, MRN [id number]  PCP:  Shirline Frees, MD   History of Present Illness: [name] is a 71 y.o. male with coronary artery disease, drug eluting stent to LAD and posterior lateral branch 10/2009, cholecystectomy November 2012, history of pulmonary sarcoid. Cardiac catheterization 10/12/11 that revealed patent LAD, small vessels with normal left ventricular function of 55%, increased left ventricular end-diastolic pressure. Subsequent catheterization was performed on 04/01/14 which resulted in continued medical management. There was moderate diffuse disease in the small caliber distal LAD segment. He has had chronic symptoms of shortness of breath,  decreased endurance which has recently improved with weight loss.   No evidence of increase sarcoidosis on CT scan. Difficult diabetes. Dr. Joya Gaskins on chronic prednisone therapy which seemed to help with lower extremity discomfort.  Wife surgery stroke after neck surgery. Daughter killed in tractor accident.  In the past, not able to tolerate beta blockers or statins.   Chest pain, leg swelling, shortness of breath with activity with lying down. Back pain, dizziness.  He retired in June 2017 and was doing very well, traveling the country. Quite active. He was putting out decorations for the holidays and his back went out. He was laid up in bed for several weeks. Finally had an MRI and had injections and laser ablation of nerves. He is doing better. Unfortunate had a bout of bronchitis. Had some wheezing, shortness of breath. Had gained back about 10 pounds.    Wt Readings from Last 3 Encounters:  06/17/16 239 lb (108.4 kg)  06/29/15 229 lb 6.4 oz (104.1 kg)  06/25/15 230 lb (104.3 kg)     Past Medical History:  Diagnosis Date  . Adrenal insufficiency ([place])   . Coronary artery disease    has  stents  . Coronary atherosclerosis of native coronary artery    Proximal LAD, posterior lateral stent widely patent-10/12/11  . Diabetes mellitus    insulin and pills  . Heart attack 2010  . Hypertension   . OSA (obstructive sleep apnea)    uses VPAC sleep study 2 years done through [facility]. Dr. Marlou Porch arranged study  . Pneumonia    3-4 years ago  . Sarcoid ([place])   . Type 2 diabetes mellitus ([place])     Past Surgical History:  Procedure Laterality Date  . APPENDECTOMY    . CATARACT EXTRACTION  2011   bilat  . CHOLECYSTECTOMY  01/25/2011   Procedure: LAPAROSCOPIC CHOLECYSTECTOMY WITH INTRAOPERATIVE CHOLANGIOGRAM;  Surgeon: Judieth Keens, DO;  Location: [facility];  Service: General;  Laterality: N/A;  . CORONARY ANGIOPLASTY     most recent 11/2009  . CORONARY STENT PLACEMENT  2009   in LAD and side branch PTCA  . INTERCOSTAL NERVE BLOCK  2011   x2. lumbar spine  . LEFT HEART CATHETERIZATION WITH CORONARY ANGIOGRAM Bilateral 10/12/2011   Procedure: LEFT HEART CATHETERIZATION WITH CORONARY ANGIOGRAM;  Surgeon: Candee Furbish, MD;  Location: [facility] CATH LAB;  Service: Cardiovascular;  Laterality: Bilateral;  . LEFT HEART CATHETERIZATION WITH CORONARY ANGIOGRAM N/A 04/01/2014   Procedure: LEFT HEART CATHETERIZATION WITH CORONARY ANGIOGRAM;  Surgeon: Candee Furbish, MD;  Location: [facility] CATH LAB;  Service: Cardiovascular;  Laterality: N/A;    Current Outpatient Prescriptions  Medication Sig Dispense Refill  . acetaminophen (TYLENOL) 500 MG tablet Take 500 mg by  mouth daily.    Marland Kitchen albuterol (PROVENTIL) (2.5 MG/3ML) 0.083% nebulizer solution Take 3 mLs (2.5 mg total) by nebulization every 6 (six) hours as needed for wheezing or shortness of breath. Dx Code D86.0 360 mL 5  . Albuterol Sulfate (PROAIR RESPICLICK) 258 (90 BASE) MCG/ACT AEPB Inhale 2 puffs into the lungs every 6 (six) hours as needed. 1 each 6  . aspirin EC 81 MG tablet Take 81 mg by mouth daily.    . Cholecalciferol (VITAMIN D3) 3000 UNITS TABS  Take 3,000 Units by mouth daily.    . clopidogrel (PLAVIX) 75 MG tablet Take 1 tablet (75 mg total) by mouth daily. 90 tablet 4  . Coenzyme Q10 (COQ10 MAXIMUM STRENGTH PO) Take 1 tablet by mouth daily with lunch.    . fish oil-omega-3 fatty acids 1000 MG capsule Take 3 g by mouth daily with lunch. Omega X - 3    . furosemide (LASIX) 40 MG tablet TAKE 2 TABLETS BY MOUTH EVERY MORNING AND 2 TABLETS EVERY EVENING 360 tablet 0  . glipiZIDE (GLUCOTROL) 10 MG tablet Take 20 mg by mouth 2 (two) times daily.      . insulin aspart protamine-insulin aspart (NOVOLOG 70/30) (70-30) 100 UNIT/ML injection Inject 40-50 Units into the skin 3 (three) times daily as needed (CBG >100). If blood sugar <100 0 units, 100-149 40 units, >150 units 50 units    . losartan (COZAAR) 100 MG tablet Take 100 mg by mouth daily.    . mometasone (NASONEX) 50 MCG/ACT nasal spray Place 2 sprays into the nose daily as needed (congestion). 17 g 6  . potassium chloride (KLOR-CON M10) 10 MEQ tablet Take 2 tablets (20 mEq total) by mouth 2 (two) times daily. 360 tablet 1  . predniSONE (DELTASONE) 10 MG tablet Take 1 tablet (10 mg total) by mouth daily with breakfast. T 30 tablet 6  . RABEprazole (ACIPHEX) 20 MG tablet Take 1 tablet (20 mg total) by mouth 2 (two) times daily as needed. 120 tablet 0  . Vitamin D, Ergocalciferol, (DRISDOL) 50000 UNITS CAPS capsule Take 50,000 Units by mouth every 7 (seven) days. On Sundays  1   No current facility-administered medications for this visit.     Allergies:    Allergies  Allergen Reactions  . Hydrocortisone Nausea Only  . Statins Other (See Comments)    Pt says they make him "mean"    Social History:  The patient  reports that he has never smoked. He has never used smokeless tobacco. He reports that he does not drink alcohol or use drugs.   Family History  Problem Relation Age of Onset  . Kidney disease Mother   . Diabetes Mother   . Kidney cancer Mother   . Heart attack Father   .  Asthma Sister   . Anesthesia problems Sister     "Kidney's did not wake up"    ROS:  See history of present illness. All other review of systems negative.  PHYSICAL EXAM: VS:  BP 138/76   Pulse 85   Ht 5\' 10"  (1.778 m)   Wt 239 lb (108.4 kg)   SpO2 96%   BMI 34.29 kg/m  Well nourished, well developed, in no acute distress  HEENT: normal, [place]/AT, EOMI, ruddy Neck: no JVD, normal carotid upstroke, no bruit Cardiac:  normal S1, S2; RRR; no murmur  Lungs:  clear to auscultation bilaterally, subtle wheeze right lower lobe, rhonchi or rales  Abd: soft, nontender, no hepatomegaly, no bruits obese  Ext: 1+ pitting edema bilateral lower extremities, 2+ distal pulses Skin: warm and dry thickened GU: deferred Neuro: no focal abnormalities noted, AAO x 3  EKG:  EKG is ordered today. 06/17/16-sinus rhythm, left axis deviation, no other abnormalities. Artifact in V2. Personally viewed-prior 06/29/15-sinus bradycardia with sinus arrhythmia rate 57, left anterior fascicular block personally viewed-prior 03/03/14-sinus rhythm, LAFB, septal infarct, no ST segment changes, heart rate 84 bpm.  ASSESSMENT AND PLAN:  1. Chronic diastolic heart failure-in the past, he has not tolerated beta blockers. Continue with other medications. Lasix has been 80 mg BID. I asked him to take an extra 80 mg of Lasix for the next 2 or 3 days to see we can help jump start diuresis. He was drinking lots of fluids with his antibiotics recently. 2. Chronic stable angina/dyspnea- Prior catheterization repeat as above. Reassurance. He was feeling much better after weight loss, 50 pounds. Some of that weight has come back after his back injury where he was laid in bed for several weeks. Continue to promote exercise. 3. Fluid overload-much improved with salt restriction, weight loss. No changes. 4. Obesity-good job with overall weight loss. Continue to encourage his weight loss. 5. Diabetes-uncontrolled-previously A1c in the 9  range. Now 6.8. Excellent. Continue to work with Dr. Kenton Kingfisher. No changes made, per primary team 6. Pulmonary sarcoid-Dr. Elsworth Soho, former Dr. Joya Gaskins. Tammy Parrett.  Prednisone. Remember, he did have difficulty with leg movement when his prednisone was decreased previously. No recent increase on CT. recent on antibiotics for bronchitis. 7. Hyperlipidemia-hypertriglyceridemia in the setting of uncontrolled diabetes. Continue to monitor. 8. We will see back in 6 months.  Signed, Candee Furbish, MD [facility]  06/17/2016 5:38 PM    .

## 2016-06-17 NOTE — Patient Instructions (Signed)
Medication Instructions:  Please take an extra 80 mg of Furosemide a day for 2 to 3 days. Continue all other medications as listed.  Please limit fluid intake to 1 to 1.5 liters a day.  Follow-Up: Follow up in 6 months with Dr. Marlou Porch.  You will receive a letter in the mail 2 months before you are due.  Please call us when you receive this letter to schedule your follow up appointment.   If you need a refill on your cardiac medications before your next appointment, please call your pharmacy.  Thank you for choosing [facility]!!

## 2016-06-23 ENCOUNTER — Ambulatory Visit (INDEPENDENT_AMBULATORY_CARE_PROVIDER_SITE_OTHER): Payer: Medicare HMO | Admitting: Ophthalmology

## 2016-06-23 DIAGNOSIS — H43813 Vitreous degeneration, bilateral: Secondary | ICD-10-CM | POA: Diagnosis not present

## 2016-06-23 DIAGNOSIS — E113391 Type 2 diabetes mellitus with moderate nonproliferative diabetic retinopathy without macular edema, right eye: Secondary | ICD-10-CM | POA: Diagnosis not present

## 2016-06-23 DIAGNOSIS — E113512 Type 2 diabetes mellitus with proliferative diabetic retinopathy with macular edema, left eye: Secondary | ICD-10-CM

## 2016-06-23 DIAGNOSIS — I10 Essential (primary) hypertension: Secondary | ICD-10-CM | POA: Diagnosis not present

## 2016-06-23 DIAGNOSIS — H35033 Hypertensive retinopathy, bilateral: Secondary | ICD-10-CM

## 2016-06-23 DIAGNOSIS — E11311 Type 2 diabetes mellitus with unspecified diabetic retinopathy with macular edema: Secondary | ICD-10-CM | POA: Diagnosis not present

## 2016-06-28 ENCOUNTER — Ambulatory Visit: Payer: BLUE CROSS/BLUE SHIELD | Admitting: Cardiology

## 2016-06-29 ENCOUNTER — Encounter (HOSPITAL_COMMUNITY): Payer: Self-pay | Admitting: Emergency Medicine

## 2016-06-29 ENCOUNTER — Observation Stay (HOSPITAL_COMMUNITY)
Admission: EM | Admit: 2016-06-29 | Discharge: 2016-07-01 | Disposition: A | Discharge status: Home / Self Care | Payer: Medicare HMO | Attending: Internal Medicine | Admitting: Internal Medicine

## 2016-06-29 DIAGNOSIS — Z9841 Cataract extraction status, right eye: Secondary | ICD-10-CM | POA: Insufficient documentation

## 2016-06-29 DIAGNOSIS — Z7902 Long term (current) use of antithrombotics/antiplatelets: Secondary | ICD-10-CM | POA: Diagnosis not present

## 2016-06-29 DIAGNOSIS — I130 Hypertensive heart and chronic kidney disease with heart failure and stage 1 through stage 4 chronic kidney disease, or unspecified chronic kidney disease: Secondary | ICD-10-CM | POA: Insufficient documentation

## 2016-06-29 DIAGNOSIS — D649 Anemia, unspecified: Secondary | ICD-10-CM | POA: Diagnosis not present

## 2016-06-29 DIAGNOSIS — N183 Chronic kidney disease, stage 3 unspecified: Secondary | ICD-10-CM | POA: Diagnosis present

## 2016-06-29 DIAGNOSIS — Z9842 Cataract extraction status, left eye: Secondary | ICD-10-CM | POA: Diagnosis not present

## 2016-06-29 DIAGNOSIS — Z7984 Long term (current) use of oral hypoglycemic drugs: Secondary | ICD-10-CM | POA: Diagnosis not present

## 2016-06-29 DIAGNOSIS — Z7982 Long term (current) use of aspirin: Secondary | ICD-10-CM | POA: Insufficient documentation

## 2016-06-29 DIAGNOSIS — Z888 Allergy status to other drugs, medicaments and biological substances status: Secondary | ICD-10-CM | POA: Insufficient documentation

## 2016-06-29 DIAGNOSIS — Z7951 Long term (current) use of inhaled steroids: Secondary | ICD-10-CM | POA: Insufficient documentation

## 2016-06-29 DIAGNOSIS — D508 Other iron deficiency anemias: Secondary | ICD-10-CM

## 2016-06-29 DIAGNOSIS — Z825 Family history of asthma and other chronic lower respiratory diseases: Secondary | ICD-10-CM | POA: Insufficient documentation

## 2016-06-29 DIAGNOSIS — E1122 Type 2 diabetes mellitus with diabetic chronic kidney disease: Secondary | ICD-10-CM | POA: Insufficient documentation

## 2016-06-29 DIAGNOSIS — E119 Type 2 diabetes mellitus without complications: Secondary | ICD-10-CM | POA: Diagnosis not present

## 2016-06-29 DIAGNOSIS — R600 Localized edema: Secondary | ICD-10-CM | POA: Diagnosis not present

## 2016-06-29 DIAGNOSIS — I252 Old myocardial infarction: Secondary | ICD-10-CM | POA: Insufficient documentation

## 2016-06-29 DIAGNOSIS — Z8051 Family history of malignant neoplasm of kidney: Secondary | ICD-10-CM | POA: Insufficient documentation

## 2016-06-29 DIAGNOSIS — I25118 Atherosclerotic heart disease of native coronary artery with other forms of angina pectoris: Secondary | ICD-10-CM | POA: Diagnosis present

## 2016-06-29 DIAGNOSIS — E7800 Pure hypercholesterolemia, unspecified: Secondary | ICD-10-CM | POA: Diagnosis not present

## 2016-06-29 DIAGNOSIS — G4730 Sleep apnea, unspecified: Secondary | ICD-10-CM | POA: Diagnosis present

## 2016-06-29 DIAGNOSIS — Z7952 Long term (current) use of systemic steroids: Secondary | ICD-10-CM | POA: Insufficient documentation

## 2016-06-29 DIAGNOSIS — Z9049 Acquired absence of other specified parts of digestive tract: Secondary | ICD-10-CM | POA: Diagnosis not present

## 2016-06-29 DIAGNOSIS — Z794 Long term (current) use of insulin: Secondary | ICD-10-CM | POA: Insufficient documentation

## 2016-06-29 DIAGNOSIS — E039 Hypothyroidism, unspecified: Secondary | ICD-10-CM | POA: Diagnosis not present

## 2016-06-29 DIAGNOSIS — Z885 Allergy status to narcotic agent status: Secondary | ICD-10-CM | POA: Insufficient documentation

## 2016-06-29 DIAGNOSIS — I10 Essential (primary) hypertension: Secondary | ICD-10-CM | POA: Diagnosis not present

## 2016-06-29 DIAGNOSIS — K219 Gastro-esophageal reflux disease without esophagitis: Secondary | ICD-10-CM | POA: Diagnosis not present

## 2016-06-29 DIAGNOSIS — N1831 Chronic kidney disease, stage 3a: Secondary | ICD-10-CM | POA: Diagnosis present

## 2016-06-29 DIAGNOSIS — I2510 Atherosclerotic heart disease of native coronary artery without angina pectoris: Secondary | ICD-10-CM | POA: Insufficient documentation

## 2016-06-29 DIAGNOSIS — E2740 Unspecified adrenocortical insufficiency: Secondary | ICD-10-CM | POA: Insufficient documentation

## 2016-06-29 DIAGNOSIS — G4733 Obstructive sleep apnea (adult) (pediatric): Secondary | ICD-10-CM | POA: Diagnosis present

## 2016-06-29 DIAGNOSIS — D860 Sarcoidosis of lung: Secondary | ICD-10-CM

## 2016-06-29 DIAGNOSIS — Z9889 Other specified postprocedural states: Secondary | ICD-10-CM | POA: Insufficient documentation

## 2016-06-29 DIAGNOSIS — E1165 Type 2 diabetes mellitus with hyperglycemia: Secondary | ICD-10-CM | POA: Diagnosis not present

## 2016-06-29 DIAGNOSIS — Z955 Presence of coronary angioplasty implant and graft: Secondary | ICD-10-CM | POA: Insufficient documentation

## 2016-06-29 DIAGNOSIS — D509 Iron deficiency anemia, unspecified: Secondary | ICD-10-CM | POA: Diagnosis not present

## 2016-06-29 DIAGNOSIS — Z833 Family history of diabetes mellitus: Secondary | ICD-10-CM | POA: Insufficient documentation

## 2016-06-29 DIAGNOSIS — D869 Sarcoidosis, unspecified: Secondary | ICD-10-CM | POA: Diagnosis not present

## 2016-06-29 DIAGNOSIS — Z841 Family history of disorders of kidney and ureter: Secondary | ICD-10-CM | POA: Insufficient documentation

## 2016-06-29 DIAGNOSIS — J209 Acute bronchitis, unspecified: Secondary | ICD-10-CM | POA: Diagnosis not present

## 2016-06-29 DIAGNOSIS — I5032 Chronic diastolic (congestive) heart failure: Secondary | ICD-10-CM | POA: Diagnosis not present

## 2016-06-29 HISTORY — DX: Chronic kidney disease, stage 3 unspecified: N18.30

## 2016-06-29 HISTORY — DX: Chronic kidney disease, stage 3 (moderate): N18.3

## 2016-06-29 LAB — CBC WITH DIFFERENTIAL/PLATELET
BASOS PCT: 0 %
Basophils Absolute: 0 10*3/uL (ref 0.0–0.1)
EOS ABS: 0.1 10*3/uL (ref 0.0–0.7)
EOS PCT: 1 %
HEMATOCRIT: 27.1 % — AB (ref 39.0–52.0)
HEMOGLOBIN: 7.1 g/dL — AB (ref 13.0–17.0)
LYMPHS PCT: 5 %
Lymphs Abs: 0.7 10*3/uL (ref 0.7–4.0)
MCH: 19 pg — ABNORMAL LOW (ref 26.0–34.0)
MCHC: 26.2 g/dL — AB (ref 30.0–36.0)
MCV: 72.7 fL — AB (ref 78.0–100.0)
Monocytes Absolute: 0.7 10*3/uL (ref 0.1–1.0)
Monocytes Relative: 5 %
Neutro Abs: 12.9 10*3/uL — ABNORMAL HIGH (ref 1.7–7.7)
Neutrophils Relative %: 89 %
Platelets: 393 10*3/uL (ref 150–400)
RBC: 3.73 MIL/uL — ABNORMAL LOW (ref 4.22–5.81)
RDW: 19.8 % — ABNORMAL HIGH (ref 11.5–15.5)
WBC: 14.4 10*3/uL — ABNORMAL HIGH (ref 4.0–10.5)

## 2016-06-29 LAB — POC OCCULT BLOOD, ED: Fecal Occult Bld: NEGATIVE

## 2016-06-29 MED ORDER — SODIUM CHLORIDE 0.9% FLUSH
3.0000 mL | Freq: Two times a day (BID) | INTRAVENOUS | Status: DC
  Administered 2016-06-30: 3 mL via INTRAVENOUS

## 2016-06-29 MED ORDER — PANTOPRAZOLE SODIUM 40 MG IV SOLR
40.0000 mg | Freq: Two times a day (BID) | INTRAVENOUS | Status: DC
  Administered 2016-06-30 (×2): 40 mg via INTRAVENOUS
  Filled 2016-06-29 (×2): qty 40

## 2016-06-29 MED ORDER — COENZYME Q10 400 MG PO CAPS: 1.0000 | ORAL_CAPSULE | Freq: Every day | ORAL | Status: DC

## 2016-06-29 MED ORDER — FLUTICASONE PROPIONATE 50 MCG/ACT NA SUSP
1.0000 | Freq: Every day | NASAL | Status: DC
  Administered 2016-06-30 – 2016-07-01 (×2): 1 via NASAL
  Filled 2016-06-29: qty 16

## 2016-06-29 MED ORDER — ONDANSETRON HCL 4 MG/2ML IJ SOLN: 4.0000 mg | Freq: Four times a day (QID) | INTRAMUSCULAR | Status: DC | PRN

## 2016-06-29 MED ORDER — OMEGA-3-ACID ETHYL ESTERS 1 G PO CAPS
3.0000 g | ORAL_CAPSULE | Freq: Every day | ORAL | Status: DC
  Administered 2016-06-30 – 2016-07-01 (×2): 3 g via ORAL
  Filled 2016-06-29 (×2): qty 3

## 2016-06-29 MED ORDER — FUROSEMIDE 10 MG/ML IJ SOLN
60.0000 mg | Freq: Once | INTRAMUSCULAR | Status: AC
  Administered 2016-06-30: 60 mg via INTRAVENOUS
  Filled 2016-06-29: qty 6

## 2016-06-29 MED ORDER — ONDANSETRON HCL 4 MG PO TABS: 4.0000 mg | ORAL_TABLET | Freq: Four times a day (QID) | ORAL | Status: DC | PRN

## 2016-06-29 MED ORDER — ACETAMINOPHEN 325 MG PO TABS: 650.0000 mg | ORAL_TABLET | Freq: Four times a day (QID) | ORAL | Status: DC | PRN

## 2016-06-29 MED ORDER — FUROSEMIDE 10 MG/ML IJ SOLN
40.0000 mg | Freq: Once | INTRAMUSCULAR | Status: DC
Start: 2016-06-30 — End: 2016-06-29

## 2016-06-29 MED ORDER — LEVOTHYROXINE SODIUM 50 MCG PO TABS
50.0000 ug | ORAL_TABLET | Freq: Every day | ORAL | Status: DC
  Administered 2016-06-30 – 2016-07-01 (×2): 50 ug via ORAL
  Filled 2016-06-29 (×2): qty 1

## 2016-06-29 MED ORDER — LOSARTAN POTASSIUM 50 MG PO TABS
100.0000 mg | ORAL_TABLET | Freq: Every day | ORAL | Status: DC
  Administered 2016-06-30 – 2016-07-01 (×2): 100 mg via ORAL
  Filled 2016-06-29 (×2): qty 2

## 2016-06-29 MED ORDER — FUROSEMIDE 40 MG PO TABS
80.0000 mg | ORAL_TABLET | Freq: Two times a day (BID) | ORAL | Status: DC
  Filled 2016-06-29: qty 2

## 2016-06-29 MED ORDER — SODIUM CHLORIDE 0.9 % IV SOLN
Freq: Once | INTRAVENOUS | Status: AC
  Administered 2016-06-30: 02:00:00 via INTRAVENOUS

## 2016-06-29 MED ORDER — INSULIN ASPART 100 UNIT/ML ~~LOC~~ SOLN
0.0000 [IU] | Freq: Every day | SUBCUTANEOUS | Status: DC
Start: 2016-06-30 — End: 2016-07-01
  Administered 2016-06-30: 4 [IU] via SUBCUTANEOUS

## 2016-06-29 MED ORDER — ALBUTEROL SULFATE (2.5 MG/3ML) 0.083% IN NEBU: 2.5000 mg | INHALATION_SOLUTION | Freq: Four times a day (QID) | RESPIRATORY_TRACT | Status: DC | PRN

## 2016-06-29 MED ORDER — ZOLPIDEM TARTRATE 5 MG PO TABS: 5.0000 mg | ORAL_TABLET | Freq: Every evening | ORAL | Status: DC | PRN

## 2016-06-29 MED ORDER — INSULIN ASPART 100 UNIT/ML ~~LOC~~ SOLN
0.0000 [IU] | Freq: Three times a day (TID) | SUBCUTANEOUS | Status: DC
  Administered 2016-06-30: 1 [IU] via SUBCUTANEOUS
  Administered 2016-06-30: 3 [IU] via SUBCUTANEOUS
  Administered 2016-07-01: 7 [IU] via SUBCUTANEOUS
  Administered 2016-07-01: 2 [IU] via SUBCUTANEOUS

## 2016-06-29 MED ORDER — VITAMIN D 1000 UNITS PO TABS
3000.0000 [IU] | ORAL_TABLET | Freq: Every day | ORAL | Status: DC
  Administered 2016-06-30 – 2016-07-01 (×2): 3000 [IU] via ORAL
  Filled 2016-06-29 (×2): qty 3

## 2016-06-29 NOTE — ED Triage Notes (Signed)
Patient had visit at PCP due to bronchitis and SOB over past few months. Pt had blood work done today and was called due to hemoglobin of 7.4

## 2016-06-29 NOTE — H&P (Signed)
History and Physical    [name]:[id number] DOB: 01/09/1946 DOA: 06/29/2016  Referring MD/NP/PA:   PCP: Shirline Frees, MD   Patient coming from:  The patient is coming from home.  At baseline, pt is independent for most of ADL. S  Chief Complaint: SOB, palpitation and generalized weakness  HPI: [name] is a 71 y.o. male with medical history significant of dCHF, hypertension, diabetes mellitus, GERD, hypothyroidism, pulmonary sarcoidosis on chronic prednisone,, adrenal insufficiency, CAD, heart attack, s/p of stent placement, OSA on CPAP, who presents with shortness breath, palpitation and generalized weakness  Patient states that he has been having shortness of breath for several months which has been progressively getting worse. It is aggravated by walking. He also has palpitation and generalized weakness on exertion. Patient denies any chest pain, cough, fever or chills. Patient states that he was evaluated by his cardiologist last week, and was told that his congestive heart failure is stable. Patient denies nausea, vomiting, diarrhea, abdominal pain, symptoms of UTI or unilateral weakness. No tenderness in calf areas. He states that he took some Aleve in January, but did not take any NSAIDs since then. Patient denies hematuria, hematochezia or hematemesis. Of note, patient had negative EGD and colonoscopy 2006 by Dr. Deatra Ina.  ED Course: pt was found to have hemoglobin dropped from 13.1 on 03/25/14 to 7.1, negative FOBT, WBC 14.4, temperature normal, no tachycardia, oxygen saturation 95% on room air. Pending CMP. Patient is placed on telemetry bed for observation.  Review of Systems:   General: no fevers, chills, no changes in body weight, has fatigue HEENT: no blurry vision, hearing changes or sore throat Respiratory: has dyspnea,  no coughing, wheezing CV: no chest pain, no palpitations GI: no nausea, vomiting, abdominal pain, diarrhea, constipation GU: no dysuria, burning on  urination, increased urinary frequency, hematuria  Ext: has leg edema Neuro: no unilateral weakness, numbness, or tingling, no vision change or hearing loss Skin: no rash, no skin tear. MSK: No muscle spasm, no deformity, no limitation of range of movement in spin Heme: No easy bruising.  Travel history: No recent long distant travel.  Allergy:  Allergies  Allergen Reactions  . Hydrocortisone Nausea Only  . Statins Other (See Comments)    Pt says they make him "mean"    Past Medical History:  Diagnosis Date  . Adrenal insufficiency ([place])   . CKD (chronic kidney disease), stage III   . Coronary artery disease    has stents  . Coronary atherosclerosis of native coronary artery    Proximal LAD, posterior lateral stent widely patent-10/12/11  . Diabetes mellitus    insulin and pills  . Heart attack ([place]) 2010  . Hypertension   . OSA (obstructive sleep apnea)    uses VPAC sleep study 2 years done through [facility]. Dr. Marlou Porch arranged study  . Pneumonia    3-4 years ago  . Sarcoid   . Type 2 diabetes mellitus ([place])     Past Surgical History:  Procedure Laterality Date  . APPENDECTOMY    . CATARACT EXTRACTION  2011   bilat  . CHOLECYSTECTOMY  01/25/2011   Procedure: LAPAROSCOPIC CHOLECYSTECTOMY WITH INTRAOPERATIVE CHOLANGIOGRAM;  Surgeon: Judieth Keens, DO;  Location: [place];  Service: General;  Laterality: N/A;  . CORONARY ANGIOPLASTY     most recent 11/2009  . CORONARY STENT PLACEMENT  2009   in LAD and side branch PTCA  . INTERCOSTAL NERVE BLOCK  2011   x2. lumbar spine  .  LEFT HEART CATHETERIZATION WITH CORONARY ANGIOGRAM Bilateral 10/12/2011   Procedure: LEFT HEART CATHETERIZATION WITH CORONARY ANGIOGRAM;  Surgeon: Candee Furbish, MD;  Location: [facility] CATH LAB;  Service: Cardiovascular;  Laterality: Bilateral;  . LEFT HEART CATHETERIZATION WITH CORONARY ANGIOGRAM N/A 04/01/2014   Procedure: LEFT HEART CATHETERIZATION WITH CORONARY ANGIOGRAM;  Surgeon: Candee Furbish, MD;   Location: [facility] CATH LAB;  Service: Cardiovascular;  Laterality: N/A;    Social History:  reports that he has never smoked. He has never used smokeless tobacco. He reports that he does not drink alcohol or use drugs.  Family History:  Family History  Problem Relation Age of Onset  . Kidney disease Mother   . Diabetes Mother   . Kidney cancer Mother   . Heart attack Father   . Asthma Sister   . Anesthesia problems Sister     "Kidney's did not wake up"     Prior to Admission medications   Medication Sig Start Date End Date Taking? Authorizing Provider  acetaminophen (TYLENOL) 500 MG tablet Take 500 mg by mouth daily.   Yes Historical Provider, MD  albuterol (PROVENTIL) (2.5 MG/3ML) 0.083% nebulizer solution Take 3 mLs (2.5 mg total) by nebulization every 6 (six) hours as needed for wheezing or shortness of breath. Dx Code D86.0 07/24/14  Yes Elsie Stain, MD  Albuterol Sulfate (PROAIR RESPICLICK) 623 (90 BASE) MCG/ACT AEPB Inhale 2 puffs into the lungs every 6 (six) hours as needed. Patient taking differently: Inhale 2 puffs into the lungs every 6 (six) hours as needed (sob).  07/24/14  Yes Elsie Stain, MD  aspirin EC 81 MG tablet Take 81 mg by mouth daily.   Yes Historical Provider, MD  Cholecalciferol (VITAMIN D3) 3000 UNITS TABS Take 3,000 Units by mouth daily.   Yes Historical Provider, MD  clopidogrel (PLAVIX) 75 MG tablet Take 1 tablet (75 mg total) by mouth daily. 04/17/14  Yes Jerline Pain, MD  Coenzyme Q10 (COQ10 MAXIMUM STRENGTH PO) Take 1 tablet by mouth daily with lunch.   Yes Historical Provider, MD  fish oil-omega-3 fatty acids 1000 MG capsule Take 3 g by mouth daily with lunch. Omega X - 3   Yes Historical Provider, MD  furosemide (LASIX) 40 MG tablet TAKE 2 TABLETS BY MOUTH EVERY MORNING AND 2 TABLETS EVERY EVENING 09/29/15  Yes Rigoberto Noel, MD  glipiZIDE (GLUCOTROL) 10 MG tablet Take 20 mg by mouth 2 (two) times daily.     Yes Historical Provider, MD  insulin aspart  protamine-insulin aspart (NOVOLOG 70/30) (70-30) 100 UNIT/ML injection Inject 40-50 Units into the skin 3 (three) times daily as needed (CBG >100). If blood sugar <100 0 units, 100-149 40 units, >150 units 50 units   Yes Historical Provider, MD  levothyroxine (SYNTHROID, LEVOTHROID) 50 MCG tablet Take 1 tablet by mouth daily. 06/21/16  Yes Historical Provider, MD  losartan (COZAAR) 100 MG tablet Take 100 mg by mouth daily.   Yes Historical Provider, MD  mometasone (NASONEX) 50 MCG/ACT nasal spray Place 2 sprays into the nose daily as needed (congestion). 07/24/14  Yes Elsie Stain, MD  potassium chloride (KLOR-CON M10) 10 MEQ tablet Take 2 tablets (20 mEq total) by mouth 2 (two) times daily. 03/26/14  Yes Elsie Stain, MD  predniSONE (DELTASONE) 10 MG tablet Take 1 tablet (10 mg total) by mouth daily with breakfast. T 05/15/15  Yes Tammy S Parrett, NP  RABEprazole (ACIPHEX) 20 MG tablet Take 1 tablet (20 mg total) by mouth 2 (  two) times daily as needed. Patient taking differently: Take 20 mg by mouth 2 (two) times daily as needed (acid reflux).  12/07/15  Yes Rigoberto Noel, MD  Vitamin D, Ergocalciferol, (DRISDOL) 50000 UNITS CAPS capsule Take 50,000 Units by mouth every 7 (seven) days. On Sundays 12/21/13  Yes Historical Provider, MD    Physical Exam: Vitals:   06/29/16 2345 06/30/16 0000 06/30/16 0053 06/30/16 0136  BP: 131/66 124/67 140/62 (!) 142/67  Pulse: 72 70 79 76  Resp: 15 (!) 21 18 18   Temp:   98 F (36.7 C) 98.2 F (36.8 C)  TempSrc:   Oral Oral  SpO2: 96% 95% 98% 98%  Weight:   106.3 kg (234 lb 5.6 oz)   Height:   5\' 10"  (1.778 m)    General: Not in acute distress HEENT:       Eyes: PERRL, EOMI, no scleral icterus.       ENT: No discharge from the ears and nose, no pharynx injection, no tonsillar enlargement.        Neck: No JVD, no bruit, no mass felt. Heme: No neck lymph node enlargement. Cardiac: S1/S2, RRR, No murmurs, No gallops or rubs. Respiratory: No rales,  wheezing, rhonchi or rubs. GI: Soft, nondistended, nontender, no rebound pain, no organomegaly, BS present. GU: No hematuria Ext: has trace pitting leg edema bilaterally. 2+DP/PT pulse bilaterally. Musculoskeletal: No joint deformities, No joint redness or warmth, no limitation of ROM in spin. Skin: No rashes.  Neuro: Alert, oriented X3, cranial nerves II-XII grossly intact, moves all extremities normally. Psych: Patient is not psychotic, no suicidal or hemocidal ideation.  Labs on Admission: I have personally reviewed following labs and imaging studies  CBC:  Recent Labs Lab 06/29/16 2043 06/30/16 0009  WBC 14.4* 14.4*  NEUTROABS 12.9*  --   HGB 7.1* 7.6*  HCT 27.1* 28.1*  MCV 72.7* 72.4*  PLT 393 630   Basic Metabolic Panel:  Recent Labs Lab 06/30/16 0007  NA 136  K 4.4  CL 101  CO2 27  GLUCOSE 204*  BUN 40*  CREATININE 1.41*  CALCIUM 8.8*   GFR: Estimated Creatinine Clearance: 59.5 mL/min (A) (by C-G formula based on SCr of 1.41 mg/dL (H)). Liver Function Tests:  Recent Labs Lab 06/30/16 0007  AST 26  ALT 19  ALKPHOS 49  BILITOT 0.5  PROT 6.1*  ALBUMIN 3.3*   No results for input(s): LIPASE, AMYLASE in the last 168 hours. No results for input(s): AMMONIA in the last 168 hours. Coagulation Profile:  Recent Labs Lab 06/30/16 0006  INR 1.00   Cardiac Enzymes: No results for input(s): CKTOTAL, CKMB, CKMBINDEX, TROPONINI in the last 168 hours. BNP (last 3 results)  Recent Labs  06/07/16 1331  PROBNP 220   HbA1C: No results for input(s): HGBA1C in the last 72 hours. CBG: No results for input(s): GLUCAP in the last 168 hours. Lipid Profile: No results for input(s): CHOL, HDL, LDLCALC, TRIG, CHOLHDL, LDLDIRECT in the last 72 hours. Thyroid Function Tests: No results for input(s): TSH, T4TOTAL, FREET4, T3FREE, THYROIDAB in the last 72 hours. Anemia Panel:  Recent Labs  06/30/16 0007  RETICCTPCT 3.4*   Urine analysis:    Component Value  Date/Time   COLORURINE YELLOW 11/09/2010 2146   APPEARANCEUR CLEAR 11/09/2010 2146   LABSPEC 1.010 11/09/2010 2146   PHURINE 6.0 11/09/2010 2146   GLUCOSEU >1000 (A) 11/09/2010 2146   HGBUR NEGATIVE 11/09/2010 2146   BILIRUBINUR NEGATIVE 11/09/2010 2146   KETONESUR NEGATIVE  11/09/2010 2146   PROTEINUR NEGATIVE 11/09/2010 2146   UROBILINOGEN 0.2 11/09/2010 2146   NITRITE NEGATIVE 11/09/2010 2146   LEUKOCYTESUR NEGATIVE 11/09/2010 2146   Sepsis Labs: @LABRCNTIP (procalcitonin:4,lacticidven:4) )No results found for this or any previous visit (from the past 240 hour(s)).   Radiological Exams on Admission: No results found.   EKG: Not done in ED, will get one.   Assessment/Plan Principal Problem:   Symptomatic anemia Active Problems:   Diabetes mellitus without complication (HCC)   Coronary atherosclerosis   GERD   Sleep apnea   Chronic diastolic heart failure (HCC)   Pulmonary sarcoidosis (HCC)   CKD (chronic kidney disease), stage III   Symptomatic anemia: Patient's shortness breath, palpitation and generalized weakness mostly caused by anemia. He has hx of dCHF, but C-reactive seems to be compensated. Patient was seen by his cardiologist last week, and was told that his CHF is stable. The etiology for hemoglobin dropped is not clear. Patient is on aspirin, Plavix and chronic prednisone, putting pt at risk of gastric mucosal erosion or ulcer.   -will place on tele bed -will transfuse 1 units of blood -will check LDH, peripheral smear, haptoglobin, anemia panel -2 large born IV -cbc q6h, transfuse if Hgb<7.0 -hold ASA and plavix -start IV Protonix 40 mg twice a day  Chronic diastolic heart failure: 2-D echo on 03/04/14 showed EF of 50-55 percent with grade 2 diastolic dysfunction. Patient has trace leg edema, but no JVD. CHF seems to be compensated. -Will give one dose of Lasix 60 mg by IV before Starting Blood Transfusion -Continue oral Lasix, 80 mg twice a day -Check  BNP  DM-II: Last A1c 12.2 on 08/15/08, poorly controled. Patient is taking mixed insulin 70/30 as sliding scale at home -SSI  CKD-III: stable. Baseline cre 1.2-1.5. Patient's creatinine 1.41, BUN 40. -Follow up renal function by the rapid  CAD: s/p of stent. No chest pain -Hold ASA and plavix since GI bleeding cannot be completely ruled out.  GERD: -Protonix IV as above  OSA: -CPAP  Pulmonary sarcoidosis:  -continue prednisone.   Hx of Acute renal insufficiency: Blood pressure normal. No sense of acute adrenal insufficiency. -check cortisol level in AM   DVT ppx: SCD Code Status: Full code Family Communication: Yes, patient's  Wife at bed side Disposition Plan:  Anticipate discharge back to previous home environment Consults called:  none Admission status: Obs / tele    Date of Service 06/30/2016    Ivor Costa [facility] Pager [phone]  If 7PM-7AM, please contact night-coverage [email or web address] Password [email or web address] 06/30/2016, 1:48 AM

## 2016-06-29 NOTE — ED Notes (Signed)
Attempted to start an IV x 2 with no success.

## 2016-06-29 NOTE — ED Provider Notes (Signed)
[facility] [facility] DEPT Provider Note   CSN: [id number] Arrival date & time: 06/29/16  2004   By signing my name below, I, Eunice Blase, attest that this documentation has been prepared under the direction and in the presence of [employer], PA-C. Electronically Signed: Eunice Blase, Scribe. 06/29/16. 10:39 PM.   History   Chief Complaint Chief Complaint  Patient presents with  . Low hemoglobin  . Shortness of Breath   The history is provided by the patient and medical records. No language interpreter was used.    [name] is a 71 y.o. male with h/o Diabetes mellitus, hypertension, CAD status post stent placement. He presents to the emergency department for worsening fatigue and dyspnea on exertion. He notes waxing and waning symptoms over the past 4 months, though has noticed gradual worsening lately. He was seen by his cardiologist this past week did not believe his symptoms to be cardiac in nature. The patient subsequently saw his primary care doctor today who performed blood work. The patient was notified later this afternoon about low hemoglobin levels. Patient does report being told that he was developing mild anemia. He has not noted any fevers, abdominal pain, hematemesis, melena, or hematochezia. He denies frequent NSAID use.   Past Medical History:  Diagnosis Date  . Adrenal insufficiency ([place])   . Coronary artery disease    has stents  . Coronary atherosclerosis of native coronary artery    Proximal LAD, posterior lateral stent widely patent-10/12/11  . Diabetes mellitus    insulin and pills  . Heart attack ([place]) 2010  . Hypertension   . OSA (obstructive sleep apnea)    uses VPAC sleep study 2 years done through [facility]. Dr. Marlou Porch arranged study  . Pneumonia    3-4 years ago  . Sarcoid   . Type 2 diabetes mellitus [facility])     Patient Active Problem List   Diagnosis Date Noted  . Symptomatic anemia 06/29/2016  . Chronic diastolic heart failure ([place]) 01/14/2014  .  Stable angina ([place]) 01/14/2014  . Obesity 01/14/2014  . Pulmonary sarcoidosis ([place]) 01/14/2014  . Uncontrolled diabetes mellitus ([place]) 01/14/2014  . Hyperlipidemia 01/14/2014  . Coronary atherosclerosis of native coronary artery   . Sinusitis, chronic 05/30/2011  . Sleep apnea 10/09/2008  . ADRENAL INSUFFICIENCY, HX OF 02/21/2008  . Coronary atherosclerosis 11/20/2007  . Sarcoidosis 02/20/2007  . Diabetes mellitus without complication ([place]) 64/40/3474  . GERD 12/20/2006    Past Surgical History:  Procedure Laterality Date  . APPENDECTOMY    . CATARACT EXTRACTION  2011   bilat  . CHOLECYSTECTOMY  01/25/2011   Procedure: LAPAROSCOPIC CHOLECYSTECTOMY WITH INTRAOPERATIVE CHOLANGIOGRAM;  Surgeon: Judieth Keens, DO;  Location: [place];  Service: General;  Laterality: N/A;  . CORONARY ANGIOPLASTY     most recent 11/2009  . CORONARY STENT PLACEMENT  2009   in LAD and side branch PTCA  . INTERCOSTAL NERVE BLOCK  2011   x2. lumbar spine  . LEFT HEART CATHETERIZATION WITH CORONARY ANGIOGRAM Bilateral 10/12/2011   Procedure: LEFT HEART CATHETERIZATION WITH CORONARY ANGIOGRAM;  Surgeon: Candee Furbish, MD;  Location: [facility] CATH LAB;  Service: Cardiovascular;  Laterality: Bilateral;  . LEFT HEART CATHETERIZATION WITH CORONARY ANGIOGRAM N/A 04/01/2014   Procedure: LEFT HEART CATHETERIZATION WITH CORONARY ANGIOGRAM;  Surgeon: Candee Furbish, MD;  Location[facility] CATH LAB;  Service: Cardiovascular;  Laterality: N/A;       Home Medications    Prior to Admission medications   Medication Sig Start Date End  Date Taking? Authorizing Provider  acetaminophen (TYLENOL) 500 MG tablet Take 500 mg by mouth daily.   Yes Historical Provider, MD  albuterol (PROVENTIL) (2.5 MG/3ML) 0.083% nebulizer solution Take 3 mLs (2.5 mg total) by nebulization every 6 (six) hours as needed for wheezing or shortness of breath. Dx Code D86.0 07/24/14  Yes Elsie Stain, MD  Albuterol Sulfate (PROAIR RESPICLICK) 027 (90 BASE)  MCG/ACT AEPB Inhale 2 puffs into the lungs every 6 (six) hours as needed. Patient taking differently: Inhale 2 puffs into the lungs every 6 (six) hours as needed (sob).  07/24/14  Yes Elsie Stain, MD  aspirin EC 81 MG tablet Take 81 mg by mouth daily.   Yes Historical Provider, MD  Cholecalciferol (VITAMIN D3) 3000 UNITS TABS Take 3,000 Units by mouth daily.   Yes Historical Provider, MD  clopidogrel (PLAVIX) 75 MG tablet Take 1 tablet (75 mg total) by mouth daily. 04/17/14  Yes Jerline Pain, MD  Coenzyme Q10 (COQ10 MAXIMUM STRENGTH PO) Take 1 tablet by mouth daily with lunch.   Yes Historical Provider, MD  fish oil-omega-3 fatty acids 1000 MG capsule Take 3 g by mouth daily with lunch. Omega X - 3   Yes Historical Provider, MD  furosemide (LASIX) 40 MG tablet TAKE 2 TABLETS BY MOUTH EVERY MORNING AND 2 TABLETS EVERY EVENING 09/29/15  Yes Rigoberto Noel, MD  glipiZIDE (GLUCOTROL) 10 MG tablet Take 20 mg by mouth 2 (two) times daily.     Yes Historical Provider, MD  insulin aspart protamine-insulin aspart (NOVOLOG 70/30) (70-30) 100 UNIT/ML injection Inject 40-50 Units into the skin 3 (three) times daily as needed (CBG >100). If blood sugar <100 0 units, 100-149 40 units, >150 units 50 units   Yes Historical Provider, MD  levothyroxine (SYNTHROID, LEVOTHROID) 50 MCG tablet Take 1 tablet by mouth daily. 06/21/16  Yes Historical Provider, MD  losartan (COZAAR) 100 MG tablet Take 100 mg by mouth daily.   Yes Historical Provider, MD  mometasone (NASONEX) 50 MCG/ACT nasal spray Place 2 sprays into the nose daily as needed (congestion). 07/24/14  Yes Elsie Stain, MD  potassium chloride (KLOR-CON M10) 10 MEQ tablet Take 2 tablets (20 mEq total) by mouth 2 (two) times daily. 03/26/14  Yes Elsie Stain, MD  predniSONE (DELTASONE) 10 MG tablet Take 1 tablet (10 mg total) by mouth daily with breakfast. T 05/15/15  Yes Tammy S Parrett, NP  RABEprazole (ACIPHEX) 20 MG tablet Take 1 tablet (20 mg total) by  mouth 2 (two) times daily as needed. Patient taking differently: Take 20 mg by mouth 2 (two) times daily as needed (acid reflux).  12/07/15  Yes Rigoberto Noel, MD  Vitamin D, Ergocalciferol, (DRISDOL) 50000 UNITS CAPS capsule Take 50,000 Units by mouth every 7 (seven) days. On Sundays 12/21/13  Yes Historical Provider, MD    Family History Family History  Problem Relation Age of Onset  . Kidney disease Mother   . Diabetes Mother   . Kidney cancer Mother   . Heart attack Father   . Asthma Sister   . Anesthesia problems Sister     "Kidney's did not wake up"    Social History Social History  Substance Use Topics  . Smoking status: Never Smoker  . Smokeless tobacco: Never Used  . Alcohol use No     Allergies   Hydrocortisone and Statins   Review of Systems Review of Systems All other systems reviewed and all systems are negative for  acute changes except as noted in the HPI and PMH.     Physical Exam Updated Vital Signs BP 110/68 (BP Location: Left Arm)   Pulse 71   Temp 98.5 F (36.9 C) (Oral)   Resp 19   Ht 5\' 10"  (1.778 m)   Wt 239 lb (108.4 kg)   SpO2 96%   BMI 34.29 kg/m   Physical Exam  Constitutional: He is oriented to person, place, and time. He appears well-developed and well-nourished. No distress.  Alert and in no acute distress  HENT:  Head: Normocephalic and atraumatic.  Eyes: Conjunctivae and EOM are normal. No scleral icterus.  Neck: Normal range of motion.  Cardiovascular: Normal rate, regular rhythm and intact distal pulses.   Pulmonary/Chest: Effort normal. No respiratory distress. He has no wheezes. He has no rales.  Abdominal: Soft. He exhibits no mass. There is no tenderness. There is no guarding.  Soft, obese, nontender abdomen  Genitourinary:  Genitourinary Comments: No melena or hematochezia on chaperoned DRE. Normal rectal tone.  Musculoskeletal: Normal range of motion.  Neurological: He is alert and oriented to person, place, and time.  He exhibits normal muscle tone. Coordination normal.  Skin: Skin is warm and dry. No rash noted. He is not diaphoretic. No erythema. No pallor.  Psychiatric: He has a normal mood and affect. His behavior is normal.  Nursing note and vitals reviewed.    ED Treatments / Results  DIAGNOSTIC STUDIES: Oxygen Saturation is 96% on RA, adequate by my interpretation.    COORDINATION OF CARE: 10:15 PM-Discussed next steps with pt. Pt verbalized understanding and is agreeable with the plan. Will order blood work, then prepare for transfusion and admission.   Labs (all labs ordered are listed, but only abnormal results are displayed) Labs Reviewed  CBC WITH DIFFERENTIAL/PLATELET - Abnormal; Notable for the following:       Result Value   WBC 14.4 (*)    RBC 3.73 (*)    Hemoglobin 7.1 (*)    HCT 27.1 (*)    MCV 72.7 (*)    MCH 19.0 (*)    MCHC 26.2 (*)    RDW 19.8 (*)    Neutro Abs 12.9 (*)    All other components within normal limits  FIBRINOGEN - Abnormal; Notable for the following:    Fibrinogen 502 (*)    All other components within normal limits  COMPREHENSIVE METABOLIC PANEL - Abnormal; Notable for the following:    Glucose, Bld 204 (*)    BUN 40 (*)    Creatinine, Ser 1.41 (*)    Calcium 8.8 (*)    Total Protein 6.1 (*)    Albumin 3.3 (*)    GFR calc non Af Amer 49 (*)    GFR calc Af Amer 57 (*)    All other components within normal limits  LACTATE DEHYDROGENASE - Abnormal; Notable for the following:    LDH 235 (*)    All other components within normal limits  RETICULOCYTES - Abnormal; Notable for the following:    Retic Ct Pct 3.4 (*)    RBC. 3.86 (*)    All other components within normal limits  PROTIME-INR  APTT  PATHOLOGIST SMEAR REVIEW  HAPTOGLOBIN  VITAMIN B12  IRON AND TIBC  FERRITIN  BRAIN NATRIURETIC PEPTIDE  CORTISOL-AM, BLOOD  CBC  CBC  CBC  CBC  BASIC METABOLIC PANEL  FOLATE  POC OCCULT BLOOD, ED  TYPE AND SCREEN  ABO/RH  PREPARE RBC  (CROSSMATCH)  EKG  EKG Interpretation None       Radiology No results found.  Procedures Procedures (including critical care time)  Medications Ordered in ED Medications  0.9 %  sodium chloride infusion (not administered)  levothyroxine (SYNTHROID, LEVOTHROID) tablet 50 mcg (not administered)  furosemide (LASIX) tablet 80 mg (not administered)  losartan (COZAAR) tablet 100 mg (not administered)  albuterol (PROVENTIL) (2.5 MG/3ML) 0.083% nebulizer solution 2.5 mg (not administered)  fluticasone (FLONASE) 50 MCG/ACT nasal spray 1 spray (not administered)  acetaminophen (TYLENOL) tablet 650 mg (not administered)  Vitamin D3 TABS 3,000 Units (not administered)  Coenzyme Q10 CAPS 400 mg (not administered)  fish oil-omega-3 fatty acids capsule 3 g (not administered)  pantoprazole (PROTONIX) injection 40 mg (not administered)  insulin aspart (novoLOG) injection 0-9 Units (not administered)  insulin aspart (novoLOG) injection 0-5 Units (not administered)  furosemide (LASIX) injection 60 mg (not administered)  sodium chloride flush (NS) 0.9 % injection 3 mL (not administered)  ondansetron (ZOFRAN) tablet 4 mg (not administered)    Or  ondansetron (ZOFRAN) injection 4 mg (not administered)  zolpidem (AMBIEN) tablet 5 mg (not administered)    CRITICAL CARE Performed by: Antonietta Breach   Total critical care time: 35 minutes  Critical care time was exclusive of separately billable procedures and treating other patients.  Critical care was necessary to treat or prevent imminent or life-threatening deterioration.  Critical care was time spent personally by me on the following activities: development of treatment plan with patient and/or surrogate as well as nursing, discussions with consultants, evaluation of patient's response to treatment, examination of patient, obtaining history from patient or surrogate, ordering and performing treatments and interventions, ordering and review  of laboratory studies, ordering and review of radiographic studies, pulse oximetry and re-evaluation of patient's condition.   Initial Impression / Assessment and Plan / ED Course  I have reviewed the triage vital signs and the nursing notes.  Pertinent labs & imaging results that were available during my care of the patient were reviewed by me and considered in my medical decision making (see chart for details).     Patient presenting for worsening fatigue and dyspnea on exertion. He was advised to come to the emergency department by his primary care doctor for worsening anemia. Anemia is of unclear etiology. Plan for admission for further workup and transfusion. Case discussed with Dr. Blaine Hamper of [facility] who will admit.   Final Clinical Impressions(s) / ED Diagnoses   Final diagnoses:  Symptomatic anemia    New Prescriptions New Prescriptions   No medications on file   I personally performed the services described in this documentation, which was scribed in my presence. The recorded information has been reviewed and is accurate.      Antonietta Breach, PA-C 06/30/16 0113    Courteney Julio Alm, MD 07/05/16 1356

## 2016-06-30 ENCOUNTER — Encounter (HOSPITAL_COMMUNITY): Payer: Self-pay

## 2016-06-30 DIAGNOSIS — N1831 Chronic kidney disease, stage 3a: Secondary | ICD-10-CM | POA: Diagnosis present

## 2016-06-30 DIAGNOSIS — N183 Chronic kidney disease, stage 3 unspecified: Secondary | ICD-10-CM | POA: Diagnosis present

## 2016-06-30 DIAGNOSIS — D649 Anemia, unspecified: Secondary | ICD-10-CM

## 2016-06-30 DIAGNOSIS — Z9289 Personal history of other medical treatment: Secondary | ICD-10-CM

## 2016-06-30 DIAGNOSIS — D508 Other iron deficiency anemias: Secondary | ICD-10-CM

## 2016-06-30 DIAGNOSIS — D509 Iron deficiency anemia, unspecified: Secondary | ICD-10-CM | POA: Diagnosis not present

## 2016-06-30 HISTORY — DX: Personal history of other medical treatment: Z92.89

## 2016-06-30 LAB — COMPREHENSIVE METABOLIC PANEL
ALT: 19 U/L (ref 17–63)
AST: 26 U/L (ref 15–41)
Albumin: 3.3 g/dL — ABNORMAL LOW (ref 3.5–5.0)
Alkaline Phosphatase: 49 U/L (ref 38–126)
Anion gap: 8 (ref 5–15)
BILIRUBIN TOTAL: 0.5 mg/dL (ref 0.3–1.2)
BUN: 40 mg/dL — ABNORMAL HIGH (ref 6–20)
CHLORIDE: 101 mmol/L (ref 101–111)
CO2: 27 mmol/L (ref 22–32)
Calcium: 8.8 mg/dL — ABNORMAL LOW (ref 8.9–10.3)
Creatinine, Ser: 1.41 mg/dL — ABNORMAL HIGH (ref 0.61–1.24)
GFR, EST AFRICAN AMERICAN: 57 mL/min — AB (ref >60)
GFR, EST NON AFRICAN AMERICAN: 49 mL/min — AB (ref >60)
GLUCOSE: 204 mg/dL — AB (ref 65–99)
POTASSIUM: 4.4 mmol/L (ref 3.5–5.1)
Sodium: 136 mmol/L (ref 135–145)
TOTAL PROTEIN: 6.1 g/dL — AB (ref 6.5–8.1)

## 2016-06-30 LAB — BASIC METABOLIC PANEL
Anion gap: 9 (ref 5–15)
BUN: 40 mg/dL — AB (ref 6–20)
CHLORIDE: 103 mmol/L (ref 101–111)
CO2: 28 mmol/L (ref 22–32)
Calcium: 8.9 mg/dL (ref 8.9–10.3)
Creatinine, Ser: 1.34 mg/dL — ABNORMAL HIGH (ref 0.61–1.24)
GFR calc Af Amer: >60 mL/min (ref >60)
GFR calc non Af Amer: 52 mL/min — ABNORMAL LOW (ref >60)
Glucose, Bld: 126 mg/dL — ABNORMAL HIGH (ref 65–99)
Potassium: 3.5 mmol/L (ref 3.5–5.1)
SODIUM: 140 mmol/L (ref 135–145)

## 2016-06-30 LAB — CBC
HCT: 28.1 % — ABNORMAL LOW (ref 39.0–52.0)
HCT: 28.1 % — ABNORMAL LOW (ref 39.0–52.0)
HEMATOCRIT: 30 % — AB (ref 39.0–52.0)
HEMOGLOBIN: 8.2 g/dL — AB (ref 13.0–17.0)
Hemoglobin: 7.6 g/dL — ABNORMAL LOW (ref 13.0–17.0)
Hemoglobin: 7.8 g/dL — ABNORMAL LOW (ref 13.0–17.0)
MCH: 19.6 pg — ABNORMAL LOW (ref 26.0–34.0)
MCH: 20.3 pg — AB (ref 26.0–34.0)
MCH: 20.7 pg — AB (ref 26.0–34.0)
MCHC: 27 g/dL — AB (ref 30.0–36.0)
MCHC: 27.3 g/dL — AB (ref 30.0–36.0)
MCHC: 27.8 g/dL — ABNORMAL LOW (ref 30.0–36.0)
MCV: 72.4 fL — ABNORMAL LOW (ref 78.0–100.0)
MCV: 73 fL — ABNORMAL LOW (ref 78.0–100.0)
MCV: 75.6 fL — ABNORMAL LOW (ref 78.0–100.0)
PLATELETS: 381 10*3/uL (ref 150–400)
Platelets: 391 10*3/uL (ref 150–400)
Platelets: 363 10*3/uL (ref 150–400)
RBC: 3.85 MIL/uL — AB (ref 4.22–5.81)
RBC: 3.88 MIL/uL — ABNORMAL LOW (ref 4.22–5.81)
RBC: 3.97 MIL/uL — ABNORMAL LOW (ref 4.22–5.81)
RDW: 19.5 % — AB (ref 11.5–15.5)
RDW: 19.6 % — AB (ref 11.5–15.5)
RDW: 19.8 % — AB (ref 11.5–15.5)
WBC: 13.5 10*3/uL — AB (ref 4.0–10.5)
WBC: 14.2 10*3/uL — AB (ref 4.0–10.5)
WBC: 14.4 10*3/uL — ABNORMAL HIGH (ref 4.0–10.5)

## 2016-06-30 LAB — VITAMIN B12: Vitamin B-12: 1162 pg/mL — ABNORMAL HIGH (ref 180–914)

## 2016-06-30 LAB — FERRITIN: Ferritin: 7 ng/mL — ABNORMAL LOW (ref 24–336)

## 2016-06-30 LAB — GLUCOSE, CAPILLARY
Glucose-Capillary: 108 mg/dL — ABNORMAL HIGH (ref 65–99)
Glucose-Capillary: 134 mg/dL — ABNORMAL HIGH (ref 65–99)
Glucose-Capillary: 245 mg/dL — ABNORMAL HIGH (ref 65–99)
Glucose-Capillary: 306 mg/dL — ABNORMAL HIGH (ref 65–99)

## 2016-06-30 LAB — PROTIME-INR
INR: 1
PROTHROMBIN TIME: 13.2 s (ref 11.4–15.2)

## 2016-06-30 LAB — PREPARE RBC (CROSSMATCH)

## 2016-06-30 LAB — IRON AND TIBC
Iron: 20 ug/dL — ABNORMAL LOW (ref 45–182)
Saturation Ratios: 5 % — ABNORMAL LOW (ref 17.9–39.5)
TIBC: 377 ug/dL (ref 250–450)
UIBC: 357 ug/dL

## 2016-06-30 LAB — RETICULOCYTES
RBC.: 3.86 MIL/uL — ABNORMAL LOW (ref 4.22–5.81)
Retic Count, Absolute: 131.2 10*3/uL (ref 19.0–186.0)
Retic Ct Pct: 3.4 % — ABNORMAL HIGH (ref 0.4–3.1)

## 2016-06-30 LAB — CORTISOL-AM, BLOOD: CORTISOL - AM: 2 ug/dL — AB (ref 6.7–22.6)

## 2016-06-30 LAB — BRAIN NATRIURETIC PEPTIDE: B NATRIURETIC PEPTIDE 5: 38.9 pg/mL (ref 0.0–100.0)

## 2016-06-30 LAB — LACTATE DEHYDROGENASE: LDH: 235 U/L — ABNORMAL HIGH (ref 98–192)

## 2016-06-30 LAB — APTT: APTT: 27 s (ref 24–36)

## 2016-06-30 LAB — ABO/RH: ABO/RH(D): A POS

## 2016-06-30 LAB — FOLATE: Folate: 20.7 ng/mL (ref >5.9)

## 2016-06-30 LAB — FIBRINOGEN: FIBRINOGEN: 502 mg/dL — AB (ref 210–475)

## 2016-06-30 MED ORDER — SODIUM CHLORIDE 0.9 % IV SOLN
Freq: Once | INTRAVENOUS | Status: AC
  Administered 2016-06-30: 14:00:00 via INTRAVENOUS

## 2016-06-30 MED ORDER — ACETAMINOPHEN 325 MG PO TABS: 650.0000 mg | ORAL_TABLET | Freq: Once | ORAL | Status: DC

## 2016-06-30 MED ORDER — PANTOPRAZOLE SODIUM 40 MG PO TBEC
40.0000 mg | DELAYED_RELEASE_TABLET | Freq: Two times a day (BID) | ORAL | Status: DC
  Administered 2016-06-30 – 2016-07-01 (×2): 40 mg via ORAL
  Filled 2016-06-30 (×2): qty 1

## 2016-06-30 MED ORDER — SODIUM CHLORIDE 0.9 % IV SOLN
510.0000 mg | Freq: Once | INTRAVENOUS | Status: AC
  Administered 2016-06-30: 510 mg via INTRAVENOUS
  Filled 2016-06-30: qty 17

## 2016-06-30 MED ORDER — POTASSIUM CHLORIDE CRYS ER 10 MEQ PO TBCR
20.0000 meq | EXTENDED_RELEASE_TABLET | Freq: Two times a day (BID) | ORAL | Status: DC
  Administered 2016-06-30 – 2016-07-01 (×3): 20 meq via ORAL
  Filled 2016-06-30 (×3): qty 2

## 2016-06-30 MED ORDER — PREDNISONE 5 MG PO TABS
10.0000 mg | ORAL_TABLET | Freq: Every day | ORAL | Status: DC
  Administered 2016-07-01: 10 mg via ORAL
  Filled 2016-06-30: qty 2

## 2016-06-30 MED ORDER — FUROSEMIDE 40 MG PO TABS
80.0000 mg | ORAL_TABLET | Freq: Two times a day (BID) | ORAL | Status: DC
  Administered 2016-06-30 – 2016-07-01 (×3): 80 mg via ORAL
  Filled 2016-06-30 (×2): qty 2

## 2016-06-30 NOTE — ED Notes (Signed)
Will give bedside report for assigned room 1423.

## 2016-06-30 NOTE — Progress Notes (Signed)
Pt state that he will be going home tomorrow and doesn't want to wear the CPAP this evening.

## 2016-06-30 NOTE — Consult Note (Signed)
Consultation  Referring Provider: [facility]/Dr Maylene Roes Primary Care Physician:  Shirline Frees, MD Primary Gastroenterologist:  remote Dr Deatra Ina 2006  Reason for Consultation:  Iron deficiency anemia-symptomatic  HPI: [name] is a 71 y.o. male with multiple medical problems who was admitted through the emergency room last night after presenting with progressive shortness of breath weakness and palpitations. He had been having gradually progressive shortness of breath with walking over the past few months. He has history of coronary artery disease status post drug-eluting stent to the LAD in August 2011, and has been on aspirin and Plavix chronically. Also with congestive heart failure EF of 55% and had just been seen by his cardiologist within the past 2 weeks. Patient also with pulmonary sarcoidosis for which she is on chronic steroids, sleep apnea, adult onset diabetes mellitus and chronic kidney disease stage III. Patient noted very remotely to Dr. Deatra Ina and had EGD and colonoscopy in 2006 both of which were normal. Patient had a normal hemoglobin of 13.1 in January 2016. I do not see any intramural CBCs, had hemoglobin 7.1, hematocrit of 27.1 on  admission last evening MCV of 72. Ferritin 7 serum iron 20 TIBC 377 and iron saturation of 5. He was documented Hemoccult negative on admission. Patient denies any GI symptoms. He specifically denies any problems with heartburn indigestion, dysphagia or odynophagia, nausea, abdominal pain and weight loss changes in bowel habits melena or hematochezia. Family history is negative for colon cancer as far as he is aware. He says he feels a little bit better after having 1 unit of blood. His wife says he has been absolutely exhausted over the past 6 weeks and having significant shortness of breath and weakness with ambulation even from the house to the car. He has denied chest pain. He says he pays attention to his stools and feels that he would  have noticed if he had had any dark or black stools.   Past Medical History:  Diagnosis Date  . Adrenal insufficiency ([place])   . CKD (chronic kidney disease), stage III   . Coronary artery disease    has stents  . Coronary atherosclerosis of native coronary artery    Proximal LAD, posterior lateral stent widely patent-10/12/11  . Diabetes mellitus    insulin and pills  . Heart attack ([place]) 2010  . Hypertension   . OSA (obstructive sleep apnea)    uses VPAC sleep study 2 years done through [place]. Dr. Marlou Porch arranged study  . Pneumonia    3-4 years ago  . Sarcoid   . Type 2 diabetes mellitus ([place])     Past Surgical History:  Procedure Laterality Date  . APPENDECTOMY    . CATARACT EXTRACTION  2011   bilat  . CHOLECYSTECTOMY  01/25/2011   Procedure: LAPAROSCOPIC CHOLECYSTECTOMY WITH INTRAOPERATIVE CHOLANGIOGRAM;  Surgeon: Judieth Keens, DO;  Location: [facility];  Service: General;  Laterality: N/A;  . CORONARY ANGIOPLASTY     most recent 11/2009  . CORONARY STENT PLACEMENT  2009   in LAD and side branch PTCA  . INTERCOSTAL NERVE BLOCK  2011   x2. lumbar spine  . LEFT HEART CATHETERIZATION WITH CORONARY ANGIOGRAM Bilateral 10/12/2011   Procedure: LEFT HEART CATHETERIZATION WITH CORONARY ANGIOGRAM;  Surgeon: Candee Furbish, MD;  Location[facility] C[facility] LAB;  Service: Cardiovascular;  Laterality: Bilateral;  . LEFT HEART CATHETERIZATION WITH CORONARY ANGIOGRAM N/A 04/01/2014   Procedure: LEFT HEART CATHETERIZATION WITH CORONARY ANGIOGRAM;  Surgeon: Candee Furbish, MD;  Location: [facility] CATH LAB;  Service: Cardiovascular;  Laterality: N/A;    Prior to Admission medications   Medication Sig Start Date End Date Taking? Authorizing Provider  acetaminophen (TYLENOL) 500 MG tablet Take 500 mg by mouth daily.   Yes Historical Provider, MD  albuterol (PROVENTIL) (2.5 MG/3ML) 0.083% nebulizer solution Take 3 mLs (2.5 mg total) by nebulization every 6 (six) hours as needed for wheezing or shortness of breath.  Dx Code D86.0 07/24/14  Yes Elsie Stain, MD  Albuterol Sulfate (PROAIR RESPICLICK) 213 (90 BASE) MCG/ACT AEPB Inhale 2 puffs into the lungs every 6 (six) hours as needed. Patient taking differently: Inhale 2 puffs into the lungs every 6 (six) hours as needed (sob).  07/24/14  Yes Elsie Stain, MD  aspirin EC 81 MG tablet Take 81 mg by mouth daily.   Yes Historical Provider, MD  Cholecalciferol (VITAMIN D3) 3000 UNITS TABS Take 3,000 Units by mouth daily.   Yes Historical Provider, MD  clopidogrel (PLAVIX) 75 MG tablet Take 1 tablet (75 mg total) by mouth daily. 04/17/14  Yes Jerline Pain, MD  Coenzyme Q10 (COQ10 MAXIMUM STRENGTH PO) Take 1 tablet by mouth daily with lunch.   Yes Historical Provider, MD  fish oil-omega-3 fatty acids 1000 MG capsule Take 3 g by mouth daily with lunch. Omega X - 3   Yes Historical Provider, MD  furosemide (LASIX) 40 MG tablet TAKE 2 TABLETS BY MOUTH EVERY MORNING AND 2 TABLETS EVERY EVENING 09/29/15  Yes Rigoberto Noel, MD  glipiZIDE (GLUCOTROL) 10 MG tablet Take 20 mg by mouth 2 (two) times daily.     Yes Historical Provider, MD  insulin aspart protamine-insulin aspart (NOVOLOG 70/30) (70-30) 100 UNIT/ML injection Inject 40-50 Units into the skin 3 (three) times daily as needed (CBG >100). If blood sugar <100 0 units, 100-149 40 units, >150 units 50 units   Yes Historical Provider, MD  levothyroxine (SYNTHROID, LEVOTHROID) 50 MCG tablet Take 1 tablet by mouth daily. 06/21/16  Yes Historical Provider, MD  losartan (COZAAR) 100 MG tablet Take 100 mg by mouth daily.   Yes Historical Provider, MD  mometasone (NASONEX) 50 MCG/ACT nasal spray Place 2 sprays into the nose daily as needed (congestion). 07/24/14  Yes Elsie Stain, MD  potassium chloride (KLOR-CON M10) 10 MEQ tablet Take 2 tablets (20 mEq total) by mouth 2 (two) times daily. 03/26/14  Yes Elsie Stain, MD  predniSONE (DELTASONE) 10 MG tablet Take 1 tablet (10 mg total) by mouth daily with breakfast. T  05/15/15  Yes Tammy S Parrett, NP  RABEprazole (ACIPHEX) 20 MG tablet Take 1 tablet (20 mg total) by mouth 2 (two) times daily as needed. Patient taking differently: Take 20 mg by mouth 2 (two) times daily as needed (acid reflux).  12/07/15  Yes Rigoberto Noel, MD  Vitamin D, Ergocalciferol, (DRISDOL) 50000 UNITS CAPS capsule Take 50,000 Units by mouth every 7 (seven) days. On Sundays 12/21/13  Yes Historical Provider, MD    Current Facility-Administered Medications  Medication Dose Route Frequency Provider Last Rate Last Dose  . acetaminophen (TYLENOL) tablet 650 mg  650 mg Oral Q6H PRN Ivor Costa, MD      . albuterol (PROVENTIL) (2.5 MG/3ML) 0.083% nebulizer solution 2.5 mg  2.5 mg Nebulization Q6H PRN Ivor Costa, MD      . cholecalciferol (VITAMIN D) tablet 3,000 Units  3,000 Units Oral Daily Ivor Costa, MD   3,000 Units at 06/30/16 1100  . fluticasone (FLONASE)  50 MCG/ACT nasal spray 1 spray  1 spray Each Nare Daily Ivor Costa, MD   1 spray at 06/30/16 1100  . furosemide (LASIX) tablet 80 mg  80 mg Oral BID Shon Millet, DO   80 mg at 06/30/16 2993  . insulin aspart (novoLOG) injection 0-5 Units  0-5 Units Subcutaneous QHS Ivor Costa, MD      . insulin aspart (novoLOG) injection 0-9 Units  0-9 Units Subcutaneous TID WC Ivor Costa, MD      . levothyroxine (SYNTHROID, LEVOTHROID) tablet 50 mcg  50 mcg Oral QAC breakfast Ivor Costa, MD   50 mcg at 06/30/16 0817  . losartan (COZAAR) tablet 100 mg  100 mg Oral Daily Ivor Costa, MD      . omega-3 acid ethyl esters (LOVAZA) capsule 3 g  3 g Oral Q lunch Ivor Costa, MD      . ondansetron [facility]) tablet 4 mg  4 mg Oral Q6H PRN Ivor Costa, MD       Or  . ondansetron [facility]) injection 4 mg  4 mg Intravenous Q6H PRN Ivor Costa, MD      . pantoprazole (PROTONIX) injection 40 mg  40 mg Intravenous Q12H Ivor Costa, MD   40 mg at 06/30/16 1100  . potassium chloride (K-DUR,KLOR-CON) CR tablet 20 mEq  20 mEq Oral BID Rich Fuchs Choi, DO   20 mEq at  06/30/16 1100  . sodium chloride flush (NS) 0.9 % injection 3 mL  3 mL Intravenous Q12H Ivor Costa, MD      . zolpidem (AMBIEN) tablet 5 mg  5 mg Oral QHS PRN Ivor Costa, MD        Allergies as of 06/29/2016 - Review Complete 06/29/2016  Allergen Reaction Noted  . Hydrocortisone Nausea Only 05/22/2008  . Statins Other (See Comments) 11/08/2010    Family History  Problem Relation Age of Onset  . Kidney disease Mother   . Diabetes Mother   . Kidney cancer Mother   . Heart attack Father   . Asthma Sister   . Anesthesia problems Sister     "Kidney's did not wake up"    Social History   Social History  . Marital status: Married    Spouse name: N/A  . Number of children: 3  . Years of education: N/A   Occupational History  . 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 MANAGER [employer]   Social History Main Topics  . Smoking status: Never Smoker  . Smokeless tobacco: Never Used  . Alcohol use No  . Drug use: No  . Sexual activity: Not on file   Other Topics Concern  . Not on file   Social History Narrative  . No narrative on file    Review of Systems: Pertinent positive and negative review of systems were noted in the above HPI section.  All other review of systems was otherwise negative.  Physical Exam: Vital signs in last 24 hours: Temp:  [98 F (36.7 C)-98.5 F (36.9 C)] 98.1 F (36.7 C) (04/26 0500) Pulse Rate:  [64-98] 65 (04/26 0500) Resp:  [15-22] 18 (04/26 0500) BP: (110-142)/(49-68) 119/59 (04/26 0500) SpO2:  [95 %-100 %] 97 % (04/26 0500) Weight:  [234 lb 5.6 oz (106.3 kg)-239 lb (108.4 kg)] 234 lb 5.6 oz (106.3 kg) (04/26 0500) Last BM Date: 06/29/16 General:   Alert,  Well-developed, well-nourished,Older white male pleasant and cooperative in NAD Head:  Normocephalic and atraumatic. Eyes:  Sclera clear,  no icterus.   Conjunctiva pale. Ears:  Normal auditory acuity. Nose:  No deformity, discharge,  or lesions. Mouth:  No deformity or  lesions.   Neck:  Supple; no masses or thyromegaly. Lungs:  Clear throughout to auscultation.   No wheezes, crackles, or rhonchi. Heart:  Regular rate and rhythm; no murmurs, clicks, rubs,  or gallops. Abdomen:  Soft,nontender, BS active,nonpalp mass or hsm.   Rectal:  Deferred -documented Hemoccult negative on admission Msk:  Symmetrical without gross deformities. . Pulses:  Normal pulses noted. Extremities:  Without clubbing or edema. Neurologic:  Alert and  oriented x4;  grossly normal neurologically. Skin:  Intact without significant lesions or rashes.. Psych:  Alert and cooperative. Normal mood and affect.  Lab Results:  Recent Labs  06/29/16 2043 06/30/16 0009 06/30/16 0604  WBC 14.4* 14.4* 14.2*  HGB 7.1* 7.6* 7.8*  HCT 27.1* 28.1* 28.1*  PLT 393 391 381   BMET  Recent Labs  06/30/16 0007 06/30/16 0604  NA 136 140  K 4.4 3.5  CL 101 103  CO2 27 28  GLUCOSE 204* 126*  BUN 40* 40*  CREATININE 1.41* 1.34*  CALCIUM 8.8* 8.9   LFT  Recent Labs  06/30/16 0007  PROT 6.1*  ALBUMIN 3.3*  AST 26  ALT 19  ALKPHOS 49  BILITOT 0.5   PT/INR  Recent Labs  06/30/16 0006  LABPROT 13.2  INR 1.00     IMPRESSION:  #9 71 year old white male with several month history of progressive fatigue and exertional dyspnea, worse over the past 6 weeks, presenting to the hospital with complaints of shortness of breath out notations and weakness and found to have profound anemia with hemoglobin of 7 and iron deficiency. Hemoccult negative currently. Rule out intermittent low-grade chronic GI blood loss-occult colon neoplasm, AVMs chronic gastropathy. #2 chronic kidney disease-may have a component of anemia of chronic disease #3 sarcoidosis-on chronic steroids #4 obstructive sleep apnea #5 coronary artery disease status post stents-last 2012 #6 chronic antiplatelet therapy on aspirin and Plavix #7 adult-onset diabetes mellitus #8 congestive heart failure with EF 55%  Plan;  Transfuse 1 more unit of packed RBCs given significantly symptomatic on admission, and underlying cardiopulmonary disease We'll give one dose of IV Fereheme Advance diet . Repeat hemoglobin in a.m. As patient is Hemoccult negative and has had no evidence of recent active bleeding, best to do endoscopic evaluation with patient off Plavix to perform polypectomies/biopsy etc. with lower risk of bleeding. Patient last took Plavix and aspirin yesterday We have scheduled for colonoscopy and EGD with Dr. Carlean Purl on May 16 as an outpatient. We will see him in the office for follow-up on May 7 at 10 AM with Amy Esterwood PA-C-3 rd  floor [place]-At that time we will go over prep, repeat labs, and instruct regarding timing of holding Plavix for 5 days prior to procedure. Discussed in detail with patient and his wife and they are agreeable with this plan. Also advised should he have any problems in the interim that we will be happy to see him .    Amy Esterwood  06/30/2016, 11:32 AM    [facility] Attending   I have taken an interval history, reviewed the chart and examined the patient. I agree with the Advanced Practitioner's note, impression and recommendations.    Gatha Mayer, MD, [facility] [phone] (pager) [phone] after 5 PM, weekends and holidays  06/30/2016 3:25 PM

## 2016-06-30 NOTE — Progress Notes (Signed)
PROGRESS NOTE    [name]  YHC:[id number] DOB: 1945/07/23 DOA: 06/29/2016 PCP: Shirline Frees, MD     Brief Narrative:  [name] is a 71 y.o. male with medical history significant of dCHF, hypertension, diabetes mellitus, GERD, hypothyroidism, pulmonary sarcoidosis on chronic prednisone,, adrenal insufficiency, CAD, heart attack, s/p of stent placement, OSA on CPAP, who presents with shortness breath, palpitation, and generalized weakness. Patient states that he has been having shortness of breath for several months which has been progressively getting worse. It is aggravated by walking. He also has palpitation and generalized weakness on exertion. His PCP completed blood work and revealed Hgb 7.4 and was instructed to be evaluated in the ED.   Assessment & Plan:   Principal Problem:   Symptomatic anemia Active Problems:   Diabetes mellitus without complication (HCC)   Coronary atherosclerosis   GERD   Sleep apnea   Chronic diastolic heart failure (HCC)   Pulmonary sarcoidosis (HCC)   CKD (chronic kidney disease), stage III  Symptomatic anemia with iron deficiency  -Hemolysis labs unremarkable, INR and bilirubin wnl -Ferritin 7 -S/p 1u pRBC -GI consulted. Previously evaluated in 2006 by Dr. Deatra Ina, [facility]. Transfuse additional unit pRBC this afternoon and IV fereheme. Repeat Hgb in AM. Patient to follow up for colonoscopy and EGD with Dr. Carlean Purl on May 16 as an outpatient  Chronic diastolic heart failure -2-D echo on 03/04/14 showed EF of 50-55 percent with grade 2 diastolic dysfunction -Stable, continue home lasix 80mg  BID  DM type 2 -SSI  CKD stage 3 -Baseline Cr 1.2-1.5 -Monitor   CAD s/p stent -Hold ASA and plavix for now. Will resume at time of discharge with instructions to discontinue prior to endoscopic eval   GERD -Protonix   OSA -CPAP  Pulmonary sarcoidosis -Continue prednisone   DVT prophylaxis: SCD Code Status: Full Family Communication:  at bedside Disposition Plan: dc when Hgb stable    Consultants:   GI  Procedures:   None  Antimicrobials:   None    Subjective: Patient without new complaints. States he has been short of breath with excessive fatigue since December. Denies chest pain. He was evaluated by PCP due to SOB and was found to have Hgb 7.4. He denies any loss of blood in his stool.    Objective: Vitals:   06/30/16 0136 06/30/16 0151 06/30/16 0350 06/30/16 0500  BP: (!) 142/67 (!) 113/49 (!) 118/55 (!) 119/59  Pulse: 76 64 66 65  Resp: 18 18 18 18   Temp: 98.2 F (36.8 C) 98 F (36.7 C) 98.1 F (36.7 C) 98.1 F (36.7 C)  TempSrc: Oral Oral Oral Oral  SpO2: 98% 100% 100% 97%  Weight:    106.3 kg (234 lb 5.6 oz)  Height:        Intake/Output Summary (Last 24 hours) at 06/30/16 1349 Last data filed at 06/30/16 0904  Gross per 24 hour  Intake              335 ml  Output             1550 ml  Net            -1215 ml   Filed Weights   06/29/16 2031 06/30/16 0053 06/30/16 0500  Weight: 108.4 kg (239 lb) 106.3 kg (234 lb 5.6 oz) 106.3 kg (234 lb 5.6 oz)    Examination:  General exam: Appears calm and comfortable  Respiratory system: Clear to auscultation. Respiratory effort normal. Cardiovascular system: S1 &  S2 heard, RRR. No JVD, murmurs, rubs, gallops or clicks. No pedal edema. Gastrointestinal system: Abdomen is nondistended, soft and nontender. No organomegaly or masses felt. Normal bowel sounds heard. Central nervous system: Alert and oriented. No focal neurological deficits. Extremities: Symmetric 5 x 5 power. Skin: No rashes, lesions or ulcers Psychiatry: Judgement and insight appear normal. Mood & affect appropriate.   Data Reviewed: I have personally reviewed following labs and imaging studies  CBC:  Recent Labs Lab 06/29/16 2043 06/30/16 0009 06/30/16 0604 06/30/16 1146  WBC 14.4* 14.4* 14.2* 13.5*  NEUTROABS 12.9*  --   --   --   HGB 7.1* 7.6* 7.8* 8.2*  HCT 27.1*  28.1* 28.1* 30.0*  MCV 72.7* 72.4* 73.0* 75.6*  PLT 393 391 381 130   Basic Metabolic Panel:  Recent Labs Lab 06/30/16 0007 06/30/16 0604  NA 136 140  K 4.4 3.5  CL 101 103  CO2 27 28  GLUCOSE 204* 126*  BUN 40* 40*  CREATININE 1.41* 1.34*  CALCIUM 8.8* 8.9   GFR: Estimated Creatinine Clearance: 62.6 mL/min (A) (by C-G formula based on SCr of 1.34 mg/dL (H)). Liver Function Tests:  Recent Labs Lab 06/30/16 0007  AST 26  ALT 19  ALKPHOS 49  BILITOT 0.5  PROT 6.1*  ALBUMIN 3.3*   No results for input(s): LIPASE, AMYLASE in the last 168 hours. No results for input(s): AMMONIA in the last 168 hours. Coagulation Profile:  Recent Labs Lab 06/30/16 0006  INR 1.00   Cardiac Enzymes: No results for input(s): CKTOTAL, CKMB, CKMBINDEX, TROPONINI in the last 168 hours. BNP (last 3 results)  Recent Labs  06/07/16 1331  PROBNP 220   HbA1C: No results for input(s): HGBA1C in the last 72 hours. CBG:  Recent Labs Lab 06/30/16 0746 06/30/16 1131  GLUCAP 108* 134*   Lipid Profile: No results for input(s): CHOL, HDL, LDLCALC, TRIG, CHOLHDL, LDLDIRECT in the last 72 hours. Thyroid Function Tests: No results for input(s): TSH, T4TOTAL, FREET4, T3FREE, THYROIDAB in the last 72 hours. Anemia Panel:  Recent Labs  06/30/16 0007 06/30/16 0045  VITAMINB12  --  1,162*  FOLATE  --  20.7  FERRITIN  --  7*  TIBC  --  377  IRON  --  20*  RETICCTPCT 3.4*  --    Sepsis Labs: No results for input(s): PROCALCITON, LATICACIDVEN in the last 168 hours.  No results found for this or any previous visit (from the past 240 hour(s)).     Radiology Studies: No results found.    Scheduled Meds: . cholecalciferol  3,000 Units Oral Daily  . fluticasone  1 spray Each Nare Daily  . furosemide  80 mg Oral BID  . insulin aspart  0-5 Units Subcutaneous QHS  . insulin aspart  0-9 Units Subcutaneous TID WC  . levothyroxine  50 mcg Oral QAC breakfast  . losartan  100 mg Oral  Daily  . omega-3 acid ethyl esters  3 g Oral Q lunch  . pantoprazole  40 mg Oral BID  . potassium chloride  20 mEq Oral BID  . sodium chloride flush  3 mL Intravenous Q12H   Continuous Infusions: . sodium chloride    . ferumoxytol       LOS: 0 days    Time spent: 45 minutes   Dessa Phi, DO [facility] [email or web address] Password [email or web address] 06/30/2016, 1:49 PM

## 2016-06-30 NOTE — Plan of Care (Signed)
Problem: Safety: Goal: Ability to remain free from injury will improve Outcome: Completed/Met Date Met: 06/30/16 Safety prevention plan discussed, pt is aware to call when needing to get out of bed.

## 2016-06-30 NOTE — ED Notes (Signed)
Terri RN at bedside attempting an IV.

## 2016-06-30 NOTE — ED Notes (Signed)
Terri RN was unable to obtain an IV. Will place an IV team consult.

## 2016-07-01 DIAGNOSIS — D649 Anemia, unspecified: Secondary | ICD-10-CM | POA: Diagnosis not present

## 2016-07-01 LAB — TYPE AND SCREEN
ABO/RH(D): A POS
Antibody Screen: NEGATIVE
UNIT DIVISION: 0
Unit division: 0

## 2016-07-01 LAB — HEMOGLOBIN AND HEMATOCRIT, BLOOD
HEMATOCRIT: 33.4 % — AB (ref 39.0–52.0)
HEMATOCRIT: 31.6 % — AB (ref 39.0–52.0)
HEMOGLOBIN: 9.8 g/dL — AB (ref 13.0–17.0)
Hemoglobin: 9.3 g/dL — ABNORMAL LOW (ref 13.0–17.0)

## 2016-07-01 LAB — PATHOLOGIST SMEAR REVIEW

## 2016-07-01 LAB — BPAM RBC
BLOOD PRODUCT EXPIRATION DATE: 201805112359
Blood Product Expiration Date: 201805152359
ISSUE DATE / TIME: 201804260128
ISSUE DATE / TIME: 201804261653
UNIT TYPE AND RH: 6200
Unit Type and Rh: 6200

## 2016-07-01 LAB — BASIC METABOLIC PANEL
ANION GAP: 10 (ref 5–15)
BUN: 37 mg/dL — ABNORMAL HIGH (ref 6–20)
CALCIUM: 8.9 mg/dL (ref 8.9–10.3)
CHLORIDE: 100 mmol/L — AB (ref 101–111)
CO2: 30 mmol/L (ref 22–32)
Creatinine, Ser: 1.51 mg/dL — ABNORMAL HIGH (ref 0.61–1.24)
GFR calc non Af Amer: 45 mL/min — ABNORMAL LOW (ref >60)
GFR, EST AFRICAN AMERICAN: 52 mL/min — AB (ref >60)
GLUCOSE: 233 mg/dL — AB (ref 65–99)
POTASSIUM: 3.7 mmol/L (ref 3.5–5.1)
Sodium: 140 mmol/L (ref 135–145)

## 2016-07-01 LAB — GLUCOSE, CAPILLARY
GLUCOSE-CAPILLARY: 308 mg/dL — AB (ref 65–99)
Glucose-Capillary: 199 mg/dL — ABNORMAL HIGH (ref 65–99)

## 2016-07-01 LAB — HAPTOGLOBIN: Haptoglobin: 240 mg/dL — ABNORMAL HIGH (ref 34–200)

## 2016-07-01 MED ORDER — FERROUS SULFATE 325 (65 FE) MG PO TBEC: 325.0000 mg | DELAYED_RELEASE_TABLET | Freq: Three times a day (TID) | ORAL | 0 refills | Status: DC

## 2016-07-01 NOTE — Discharge Summary (Addendum)
Physician Discharge Summary  [name] LNL:[id number] DOB: [birth year] DOA: 06/29/2016  PCP: Craig Frees, MD  Admit date: 06/29/2016 Discharge date: 07/01/2016  Admitted From: Home Disposition:  Home  Recommendations for Outpatient Follow-up:  1. Follow up with PCP in 1 week 2. Please obtain BMP/CBC in 1 week  3. Follow up with GI Craig Ba PA on 5/7 at 10am. EGD and colonoscopy scheduled with Dr. Carlean [name] on 5/16.   Home Health: None  Equipment/Devices: None   Discharge Condition: Stable CODE STATUS: Full  Diet recommendation: Heart healthy   Brief/Interim Summary: [name] a 71 y.o.malewith medical history significant of dCHF, hypertension, diabetes mellitus, GERD, hypothyroidism, pulmonary sarcoidosis on chronic prednisone,, adrenal insufficiency, CAD, heart attack, s/p of stent placement, OSA on CPAP, who presents with shortness breath, palpitation, and generalized weakness. Patient states that he has been having shortness of breath for several months which has been progressively getting worse. It is aggravated by walking. He also has palpitation and generalized weakness on exertion. His PCP completed blood work and revealed Hgb 7.4 and was instructed to be evaluated in the ED.   Interim: Patient underwent workup which included hemolysis labs, which were unremarkable. Ferritin level was low at 7. Fecal occult blood was negative. GI was consulted and they recommended outpatient colonoscopy and EGD with Dr. Carlean [name] on May 16. Patient received 2 units of packed red blood cells in the hospital as well as IV ferreheme with improvement in his symptoms and stabilization of hemoglobin level.  Discharge Diagnoses:  Principal Problem:   Symptomatic anemia Active Problems:   Diabetes mellitus without complication (HCC)   Coronary atherosclerosis   GERD   Sleep apnea   Chronic diastolic heart failure (HCC)   Pulmonary sarcoidosis (HCC)   CKD (chronic kidney disease), stage  III   Iron deficiency anemia   Symptomatic anemia with iron deficiency  -Hemolysis labs unremarkable, INR and bilirubin wnl -Ferritin 7 -S/p 2u pRBC -GI consulted. Previously evaluated in 2006 by Dr. Deatra [name], [facility]. Transfuse additional unit pRBC this afternoon and IV fereheme. Repeat Hgb in AM. Patient to follow up for colonoscopy and EGD with Dr. Carlean [name] on May 16 as an outpatient -Ferrous sulfate prescribed   Chronic diastolic heart failure -2-D echo on 03/04/14 showed EF of 50-55 percent with grade 2 diastolic dysfunction -Stable, continue home lasix 80mg  BID  DM type 2 -SSI  CKD stage 3 -Baseline Cr 1.2-1.5 -Monitor   CAD s/p stent -Resume aspirin and plavix at time of discharge with instructions to discontinue prior to endoscopic eval, follow up with Dr. Celesta [name] office May 7   GERD -Protonix   OSA -CPAP  Pulmonary sarcoidosis -Continue prednisone   Discharge Instructions  Discharge Instructions    Call MD for:  difficulty breathing, headache or visual disturbances    Complete by:  As directed    Call MD for:  extreme fatigue    Complete by:  As directed    Call MD for:  persistant dizziness or light-headedness    Complete by:  As directed    Call MD for:  persistant nausea and vomiting    Complete by:  As directed    Call MD for:  severe uncontrolled pain    Complete by:  As directed    Diet - low sodium heart healthy    Complete by:  As directed    Discharge instructions    Complete by:  As directed    You were cared for by a  hospitalist during your hospital stay. If you have any questions about your discharge medications or the care you received while you were in the hospital after you are discharged, you can call the unit and asked to speak with the hospitalist on call if the hospitalist that took care of you is not available. Once you are discharged, your primary care physician will handle any further medical issues. Please note that NO  REFILLS for any discharge medications will be authorized once you are discharged, as it is imperative that you return to your primary care physician (or establish a relationship with a primary care physician if you do not have one) for your aftercare needs so that they can reassess your need for medications and monitor your lab values.   Discharge instructions    Complete by:  As directed    Avoid NSAIDs, ibuprofen   Increase activity slowly    Complete by:  As directed      Allergies as of 07/01/2016      Reactions   Hydrocortisone Nausea Only   Statins Other (See Comments)   Pt says they make him "mean"      Medication List    TAKE these medications   acetaminophen 500 MG tablet Commonly known as:  TYLENOL Take 500 mg by mouth daily.   Albuterol Sulfate 108 (90 Base) MCG/ACT Aepb Commonly known as:  PROAIR RESPICLICK Inhale 2 puffs into the lungs every 6 (six) hours as needed. What changed:  reasons to take this   albuterol (2.5 MG/3ML) 0.083% nebulizer solution Commonly known as:  PROVENTIL Take 3 mLs (2.5 mg total) by nebulization every 6 (six) hours as needed for wheezing or shortness of breath. Dx Code D86.0 What changed:  Another medication with the same name was changed. Make sure you understand how and when to take each.   aspirin EC 81 MG tablet Take 81 mg by mouth daily. Notes to patient:  Restart today; follow up with your GI dr.   clopidogrel 75 MG tablet Commonly known as:  PLAVIX Take 1 tablet (75 mg total) by mouth daily. Notes to patient:  Restart today; follow up with your GI dr.   Rory [name] MAXIMUM STRENGTH PO Take 1 tablet by mouth daily with lunch.   ferrous sulfate 325 (65 FE) MG EC tablet Take 1 tablet (325 mg total) by mouth 3 (three) times daily with meals.   fish oil-omega-3 fatty acids 1000 MG capsule Take 3 g by mouth daily with lunch. Omega X - 3   furosemide 40 MG tablet Commonly known as:  LASIX TAKE 2 TABLETS BY MOUTH EVERY MORNING AND 2  TABLETS EVERY EVENING   glipiZIDE 10 MG tablet Commonly known as:  GLUCOTROL Take 20 mg by mouth 2 (two) times daily.   insulin aspart protamine- aspart (70-30) 100 UNIT/ML injection Commonly known as:  NOVOLOG MIX 70/30 Inject 40-50 Units into the skin 3 (three) times daily as needed (CBG >100). If blood sugar <100 0 units, 100-149 40 units, >150 units 50 units   levothyroxine 50 MCG tablet Commonly known as:  SYNTHROID, LEVOTHROID Take 1 tablet by mouth daily.   losartan 100 MG tablet Commonly known as:  COZAAR Take 100 mg by mouth daily.   mometasone 50 MCG/ACT nasal spray Commonly known as:  NASONEX Place 2 sprays into the nose daily as needed (congestion).   potassium chloride 10 MEQ tablet Commonly known as:  KLOR-CON M10 Take 2 tablets (20 mEq total) by mouth 2 (two)  times daily.   predniSONE 10 MG tablet Commonly known as:  DELTASONE Take 1 tablet (10 mg total) by mouth daily with breakfast. T   RABEprazole 20 MG tablet Commonly known as:  ACIPHEX Take 1 tablet (20 mg total) by mouth 2 (two) times daily as needed. What changed:  reasons to take this   Vitamin D (Ergocalciferol) 50000 units Caps capsule Commonly known as:  DRISDOL Take 50,000 Units by mouth every 7 (seven) days. On Sundays   Vitamin D3 3000 units Tabs Take 3,000 Units by mouth daily.      Follow-up Information    Craig Frees, MD. Schedule an appointment as soon as possible for a visit in 1 week(s).   Specialty:  Family Medicine Why:  Repeat labs, CBC, BMP Contact information: [place] [address] [phone]          Allergies  Allergen Reactions  . Hydrocortisone Nausea Only  . Statins Other (See Comments)    Pt says they make him "mean"    Consultations:  GI   Procedures/Studies: Dg Chest 2 View  Result Date: 06/15/2016 CLINICAL DATA:  Coughing and wheezing for 1 week question pneumonia, rare genetic lung disease per patient, type II  diabetes mellitus, hypertension, sarcoidosis, coronary artery disease EXAM: CHEST  2 VIEW COMPARISON:  06/25/2015 FINDINGS: Enlargement of cardiac silhouette. Slight pulmonary vascular congestion. Mediastinal contours normal. Minimal chronic central peribronchial thickening and RIGHT basilar atelectasis. Lungs otherwise clear. No definite infiltrate, pleural effusion or pneumothorax. Bones unremarkable. IMPRESSION: Enlargement of cardiac silhouette with pulmonary vascular congestion. Minimal chronic bronchitic changes and RIGHT basilar atelectasis without acute infiltrate. Electronically Signed   By: Lavonia Dana M.D.   On: 06/15/2016 14:03     Discharge Exam: Vitals:   06/30/16 2207 07/01/16 0635  BP: (!) 124/57 (!) 138/53  Pulse: 60 62  Resp: 18 20  Temp: 98 F (36.7 C) 98.2 F (36.8 C)   Vitals:   06/30/16 1717 06/30/16 2020 06/30/16 2207 07/01/16 0635  BP: (!) 138/56 134/67 (!) 124/57 (!) 138/53  Pulse: 62 63 60 62  Resp:  18 18 20   Temp: 98 F (36.7 C) 98.2 F (36.8 C) 98 F (36.7 C) 98.2 F (36.8 C)  TempSrc: Oral Oral Oral Oral  SpO2: 100% 100% 97% 97%  Weight:    105.2 kg (231 lb 14.8 oz)  Height:       General: Pt is alert, awake, not in acute distress Cardiovascular: RRR, S1/S2 +, no rubs, no gallops Respiratory: CTA bilaterally, no wheezing, no rhonchi Abdominal: Soft, NT, ND, bowel sounds + Extremities: no edema, no cyanosis    The results of significant diagnostics from this hospitalization (including imaging, microbiology, ancillary and laboratory) are listed below for reference.     Microbiology: No results found for this or any previous visit (from the past 240 hour(s)).   Labs: BNP (last 3 results)  Recent Labs  06/30/16 0009  BNP 20.2   Basic Metabolic Panel:  Recent Labs Lab 06/30/16 0007 06/30/16 0604 07/01/16 0519  NA 136 140 140  K 4.4 3.5 3.7  CL 101 103 100*  CO2 27 28 30   GLUCOSE 204* 126* 233*  BUN 40* 40* 37*  CREATININE 1.41*  1.34* 1.51*  CALCIUM 8.8* 8.9 8.9   Liver Function Tests:  Recent Labs Lab 06/30/16 0007  AST 26  ALT 19  ALKPHOS 49  BILITOT 0.5  PROT 6.1*  ALBUMIN 3.3*   No results for  input(s): LIPASE, AMYLASE in the last 168 hours. No results for input(s): AMMONIA in the last 168 hours. CBC:  Recent Labs Lab 06/29/16 2043 06/30/16 0009 06/30/16 0604 06/30/16 1146 06/30/16 2220 07/01/16 0519  WBC 14.4* 14.4* 14.2* 13.5*  --   --   NEUTROABS 12.9*  --   --   --   --   --   HGB 7.1* 7.6* 7.8* 8.2* 9.8* 9.3*  HCT 27.1* 28.1* 28.1* 30.0* 33.4* 31.6*  MCV 72.7* 72.4* 73.0* 75.6*  --   --   PLT 393 391 381 363  --   --    Cardiac Enzymes: No results for input(s): CKTOTAL, CKMB, CKMBINDEX, TROPONINI in the last 168 hours. BNP: Invalid input(s): POCBNP CBG:  Recent Labs Lab 06/30/16 1131 06/30/16 1655 06/30/16 2206 07/01/16 0720 07/01/16 1125  GLUCAP 134* 245* 306* 199* 308*   D-Dimer No results for input(s): DDIMER in the last 72 hours. Hgb A1c No results for input(s): HGBA1C in the last 72 hours. Lipid Profile No results for input(s): CHOL, HDL, LDLCALC, TRIG, CHOLHDL, LDLDIRECT in the last 72 hours. Thyroid function studies No results for input(s): TSH, T4TOTAL, T3FREE, THYROIDAB in the last 72 hours.  Invalid input(s): FREET3 Anemia work up  Recent Labs  06/30/16 0007 06/30/16 0045  VITAMINB12  --  1,162*  FOLATE  --  20.7  FERRITIN  --  7*  TIBC  --  377  IRON  --  20*  RETICCTPCT 3.4*  --    Urinalysis    Component Value Date/Time   COLORURINE YELLOW 11/09/2010 2146   APPEARANCEUR CLEAR 11/09/2010 2146   LABSPEC 1.010 11/09/2010 2146   PHURINE 6.0 11/09/2010 2146   GLUCOSEU >1000 (A) 11/09/2010 2146   HGBUR NEGATIVE 11/09/2010 2146   BILIRUBINUR NEGATIVE 11/09/2010 2146   KETONESUR NEGATIVE 11/09/2010 2146   PROTEINUR NEGATIVE 11/09/2010 2146   UROBILINOGEN 0.2 11/09/2010 2146   NITRITE NEGATIVE 11/09/2010 2146   LEUKOCYTESUR NEGATIVE 11/09/2010  2146   Sepsis Labs Invalid input(s): PROCALCITONIN,  WBC,  LACTICIDVEN Microbiology No results found for this or any previous visit (from the past 240 hour(s)).   Time coordinating discharge: 40 minutes  SIGNED:  Dessa Phi, DO [facility] Pager [phone]  If 7PM-7AM, please contact night-coverage [email or web address] Password [email or web address] 07/01/2016, 1:26 PM

## 2016-07-01 NOTE — Progress Notes (Signed)
Verbal and written discharge instructions given to patient and wife. Removed IV intact.

## 2016-07-11 ENCOUNTER — Telehealth: Payer: Self-pay | Admitting: *Deleted

## 2016-07-11 ENCOUNTER — Encounter: Payer: Self-pay | Admitting: Physician Assistant

## 2016-07-11 ENCOUNTER — Other Ambulatory Visit (INDEPENDENT_AMBULATORY_CARE_PROVIDER_SITE_OTHER): Payer: Medicare HMO

## 2016-07-11 ENCOUNTER — Ambulatory Visit (INDEPENDENT_AMBULATORY_CARE_PROVIDER_SITE_OTHER): Payer: Medicare HMO | Admitting: Physician Assistant

## 2016-07-11 ENCOUNTER — Encounter: Payer: Self-pay | Admitting: Internal Medicine

## 2016-07-11 VITALS — BP 130/60 | HR 64 | Ht 69.19 in | Wt 237.1 lb

## 2016-07-11 DIAGNOSIS — R5383 Other fatigue: Secondary | ICD-10-CM | POA: Diagnosis not present

## 2016-07-11 DIAGNOSIS — D508 Other iron deficiency anemias: Secondary | ICD-10-CM

## 2016-07-11 DIAGNOSIS — Z7901 Long term (current) use of anticoagulants: Secondary | ICD-10-CM

## 2016-07-11 LAB — CBC WITH DIFFERENTIAL/PLATELET
Basophils Absolute: 0.1 10*3/uL (ref 0.0–0.1)
Basophils Relative: 0.6 % (ref 0.0–3.0)
EOS ABS: 0.1 10*3/uL (ref 0.0–0.7)
EOS PCT: 1.1 % (ref 0.0–5.0)
HEMATOCRIT: 34.7 % — AB (ref 39.0–52.0)
HEMOGLOBIN: 10.8 g/dL — AB (ref 13.0–17.0)
LYMPHS ABS: 0.9 10*3/uL (ref 0.7–4.0)
Lymphocytes Relative: 7 % — ABNORMAL LOW (ref 12.0–46.0)
MCHC: 31 g/dL (ref 30.0–36.0)
MCV: 78.3 fl (ref 78.0–100.0)
MONO ABS: 0.7 10*3/uL (ref 0.1–1.0)
Monocytes Relative: 5 % (ref 3.0–12.0)
Neutro Abs: 11.5 10*3/uL — ABNORMAL HIGH (ref 1.4–7.7)
Neutrophils Relative %: 86.3 % — ABNORMAL HIGH (ref 43.0–77.0)
Platelets: 366 10*3/uL (ref 150.0–400.0)
RBC: 4.43 Mil/uL (ref 4.22–5.81)
RDW: 29.1 % — AB (ref 11.5–15.5)
WBC: 13.3 10*3/uL — ABNORMAL HIGH (ref 4.0–10.5)

## 2016-07-11 NOTE — Telephone Encounter (Signed)
07/11/2016   RE: GENTRY PILSON DOB: 1945-12-14 MRN: [id number]   Dear Dr. Candee Furbish,    We have scheduled the above patient for an endoscopic procedure. Our records show that he is on anticoagulation therapy.   Please advise as to how long the patient may come off his therapy of Plavix prior to the procedure, which is scheduled for 07-20-2016.  Please  route the Plavix clearance instructions to [place].   Sincerely,    Amy Esterwood PA-C

## 2016-07-11 NOTE — Patient Instructions (Signed)
Please go to the basement level to have your labs drawn.  Continue oral iron medication 3 times daily.  Continue Aciphex 20 mg, take 1 tab by mouth every morning.   We will call you when we get the Plavix instructions from Dr. Candee Furbish.   You have been scheduled for an endoscopy and colonoscopy. Please follow written instructions given to you at your visit today. If you use inhalers (even only as needed), please bring them with you on the day of your procedure. Your physician has requested that you go to www.[email or web address] and enter the access code given to you at your visit today. This web site gives a general overview about your procedure. However, you should still follow specific instructions given to you by our office regarding your preparation for the procedure.

## 2016-07-11 NOTE — Progress Notes (Addendum)
Subjective:    Patient ID: [name], male    DOB: Aug 23, 1945, 71 y.o.   MRN: [id number]  HPI [name] is a pleasant 71 year old white male known remotely to Dr. Deatra [name] who was recently seen by Dr. Carlean [name] during hospitalization. Patient comes back in today for follow-up and scheduling of procedures. Patient has history of coronary artery disease status post DES/LAD 2011. He is maintained on aspirin and Plavix. Also with congestive heart failure with EF of 50-55%, GERD, pulmonary sarcoidosis for which he is on chronic low-dose steroids, adult-onset diabetes mellitus and chronic kidney disease. Patient had EGD and colonoscopy in 2006 per Dr. Deatra [name] both of which were normal. He had hospital admission 4/ 25 through 07/02/2015 after complaining of progressive shortness of breath and fatigue over the past several months. He had labs drawn outpatient showing hemoglobin of 7.4 and was admitted for transfusions and further workup. He was noted to be significantly iron deficient with a ferritin of 7, Hemoccult negative during admission. Pt  has no GI symptoms. He was transfused 2 units of packed RBCs during his admission and also had a ferriheme infusion 1. He says he has felt much better over the past week or so and has been able to be more active. Last hemoglobin was 9.3 at time of discharge.   Patient is scheduled for endoscopy and colonoscopy on May 16 with Dr. Carlean [name].  Review of Systems Pertinent positive and negative review of systems were noted in the above HPI section.  All other review of systems was otherwise negative.   Outpatient Encounter Prescriptions as of 07/11/2016  Medication Sig  . acetaminophen (TYLENOL) 500 MG tablet Take 500 mg by mouth daily.  Marland Kitchen albuterol (PROVENTIL) (2.5 MG/3ML) 0.083% nebulizer solution Take 3 mLs (2.5 mg total) by nebulization every 6 (six) hours as needed for wheezing or shortness of breath. Dx Code D86.0  . Albuterol Sulfate (PROAIR RESPICLICK) 622 (90 BASE)  MCG/ACT AEPB Inhale 2 puffs into the lungs every 6 (six) hours as needed. (Patient taking differently: Inhale 2 puffs into the lungs every 6 (six) hours as needed (sob). )  . aspirin EC 81 MG tablet Take 81 mg by mouth daily.  . Cholecalciferol (VITAMIN D3) 3000 UNITS TABS Take 3,000 Units by mouth daily.  . clopidogrel (PLAVIX) 75 MG tablet Take 1 tablet (75 mg total) by mouth daily.  . Coenzyme Q10 (COQ10 MAXIMUM STRENGTH PO) Take 1 tablet by mouth daily with lunch.  . ferrous sulfate 325 (65 FE) MG EC tablet Take 1 tablet (325 mg total) by mouth 3 (three) times daily with meals.  . fish oil-omega-3 fatty acids 1000 MG capsule Take 3 g by mouth daily with lunch. Omega X - 3  . furosemide (LASIX) 40 MG tablet TAKE 2 TABLETS BY MOUTH EVERY MORNING AND 2 TABLETS EVERY EVENING  . glipiZIDE (GLUCOTROL) 10 MG tablet Take 20 mg by mouth 2 (two) times daily.    . insulin aspart protamine-insulin aspart (NOVOLOG 70/30) (70-30) 100 UNIT/ML injection Inject 40-50 Units into the skin 3 (three) times daily as needed (CBG >100). If blood sugar <100 0 units, 100-149 40 units, >150 units 50 units  . levothyroxine (SYNTHROID, LEVOTHROID) 50 MCG tablet Take 1 tablet by mouth daily.  Marland Kitchen losartan (COZAAR) 100 MG tablet Take 100 mg by mouth daily.  . mometasone (NASONEX) 50 MCG/ACT nasal spray Place 2 sprays into the nose daily as needed (congestion).  . potassium chloride (KLOR-CON M10) 10 MEQ tablet Take 2  tablets (20 mEq total) by mouth 2 (two) times daily.  . predniSONE (DELTASONE) 10 MG tablet Take 1 tablet (10 mg total) by mouth daily with breakfast. T  . RABEprazole (ACIPHEX) 20 MG tablet Take 1 tablet (20 mg total) by mouth 2 (two) times daily as needed. (Patient taking differently: Take 20 mg by mouth 2 (two) times daily as needed (acid reflux). )  . Vitamin D, Ergocalciferol, (DRISDOL) 50000 UNITS CAPS capsule Take 50,000 Units by mouth every 7 (seven) days. On Sundays   No facility-administered encounter  medications on file as of 07/11/2016.    Allergies  Allergen Reactions  . Hydrocortisone Nausea Only  . Statins Other (See Comments)    Pt says they make him "mean"   Patient Active Problem List   Diagnosis Date Noted  . CKD (chronic kidney disease), stage III   . Iron deficiency anemia   . Symptomatic anemia 06/29/2016  . Chronic diastolic heart failure ([name]) 01/14/2014  . Stable angina ([name]) 01/14/2014  . Obesity 01/14/2014  . Pulmonary sarcoidosis ([place]) 01/14/2014  . Uncontrolled diabetes mellitus ([name]) 01/14/2014  . Hyperlipidemia 01/14/2014  . Coronary atherosclerosis of native coronary artery   . Sinusitis, chronic 05/30/2011  . Sleep apnea 10/09/2008  . ADRENAL INSUFFICIENCY, HX OF 02/21/2008  . Coronary atherosclerosis 11/20/2007  . Sarcoidosis 02/20/2007  . Diabetes mellitus without complication ([place]) 19/41/7408  . GERD 12/20/2006   Social History   Social History  . Marital status: Married    Spouse name: N/A  . Number of children: 3  . Years of education: N/A   Occupational History  . 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 MANAGER [employer]   Social History Main Topics  . Smoking status: Never Smoker  . Smokeless tobacco: Never Used  . Alcohol use No  . Drug use: No  . Sexual activity: Not on file   Other Topics Concern  . Not on file   Social History Narrative  . No narrative on file    [name]'s family history includes Anesthesia problems in his sister; Asthma in his sister; Diabetes in his mother; Heart attack in his father; Kidney cancer in his mother; Kidney disease in his mother.      Objective:    Vitals:   07/11/16 1000  BP: 130/60  Pulse: 64    Physical Exam  well-developed older white male in no acute distress, very pleasant accompanied by his wife blood pressure 130/60 pulse 64, height 5 foot 9 weight 237 BMI 34.8. HEENT ;nontraumatic, cephalic EOMI PERRLA sclerae anicteric, Cardiovascular ;regular rate and rhythm with  S1-S2 no murmur or gallop, Pulmonary; clear bilaterally, Abdomen; obese soft nontender nondistended bowel sounds are active there is no palpable mass or hepatosplenomegaly, Extremities; no clubbing cyanosis or edema skin warm and dry, Neuropsych; mood and affect appropriate       Assessment & Plan:   #15 71 year old white male with new diagnosis of marked iron deficiency anemia. Patient had recent admission symptomatic with dyspnea, required 2 units of packed RBCs and IV iron infusion. He has not had any evidence of GI bleeding stool was heme negative at the time of admission. He is on chronic aspirin and Plavix. Need to rule out intermittent occult GI blood loss, colonic versus upper gut. #2 chronic antiplatelet therapy-on aspirin and Plavix #3 coronary artery disease status post drug-eluting stent 2011  #4 congestive heart failure EF 50-55% #5 status post cholecystectomy #6 chronic kidney disease stage III #7  pulmonary sarcoidosis on chronic low-dose steroids #7 adult-onset diabetes mellitus  Plan; repeat CBC today Patient was started on ferrous sulfate 325 one by mouth 3 times a day at time of discharge from the hospital, will continue He is scheduled for colonoscopy and EGD with Dr. Carlean [name] on 07/20/2016. Both procedures were reviewed in detail with patient including risks and benefits and he is agreeable to proceed. We will communicate with his cardiologist Dr. Vanice Sarah  regarding holding Plavix for 5 days prior to procedures and sure that this is acceptable for this patient.  If EGD and colonoscopy are unrevealing he will need capsule endoscopy.  Merick Kelleher S Seba Madole PA-C 07/11/2016   Cc: Shirline Frees, MD  Agree with Ms. Genia Harold assessment and plan. Gatha Mayer, MD, [facility]

## 2016-07-12 NOTE — Telephone Encounter (Signed)
Okay to come off of Plavix 5 days prior to procedure. Candee Furbish, MD

## 2016-07-13 ENCOUNTER — Ambulatory Visit: Payer: Medicare HMO | Admitting: Pulmonary Disease

## 2016-07-13 NOTE — Telephone Encounter (Signed)
Routing to [employer], CMA and Nicoletta Ba, PA-C for their knowledge.

## 2016-07-14 DIAGNOSIS — E1165 Type 2 diabetes mellitus with hyperglycemia: Secondary | ICD-10-CM | POA: Diagnosis not present

## 2016-07-14 DIAGNOSIS — E559 Vitamin D deficiency, unspecified: Secondary | ICD-10-CM | POA: Diagnosis not present

## 2016-07-14 DIAGNOSIS — D508 Other iron deficiency anemias: Secondary | ICD-10-CM | POA: Diagnosis not present

## 2016-07-19 NOTE — Telephone Encounter (Signed)
Spoke with patient today and did confirm that he stopped his Plavix on Friday 07-15-2016.

## 2016-07-20 ENCOUNTER — Ambulatory Visit (AMBULATORY_SURGERY_CENTER): Payer: Medicare HMO | Admitting: Internal Medicine

## 2016-07-20 ENCOUNTER — Encounter: Payer: Self-pay | Admitting: Internal Medicine

## 2016-07-20 ENCOUNTER — Encounter (INDEPENDENT_AMBULATORY_CARE_PROVIDER_SITE_OTHER): Payer: Medicare HMO | Admitting: Ophthalmology

## 2016-07-20 VITALS — BP 122/62 | HR 63 | Temp 97.7°F | Resp 11 | Ht 69.0 in | Wt 237.0 lb

## 2016-07-20 DIAGNOSIS — Z1211 Encounter for screening for malignant neoplasm of colon: Secondary | ICD-10-CM | POA: Diagnosis not present

## 2016-07-20 DIAGNOSIS — K635 Polyp of colon: Secondary | ICD-10-CM | POA: Diagnosis not present

## 2016-07-20 DIAGNOSIS — D509 Iron deficiency anemia, unspecified: Secondary | ICD-10-CM

## 2016-07-20 DIAGNOSIS — D123 Benign neoplasm of transverse colon: Secondary | ICD-10-CM

## 2016-07-20 DIAGNOSIS — K5140 Inflammatory polyps of colon without complications: Secondary | ICD-10-CM | POA: Diagnosis not present

## 2016-07-20 DIAGNOSIS — K921 Melena: Secondary | ICD-10-CM | POA: Diagnosis not present

## 2016-07-20 MED ORDER — SODIUM CHLORIDE 0.9 % IV SOLN: 500.0000 mL | INTRAVENOUS | Status: DC

## 2016-07-20 NOTE — Progress Notes (Signed)
A and O x3. Report to RN. Tolerated MAC anesthesia well.

## 2016-07-20 NOTE — Op Note (Signed)
[place] Patient Name: [name] Procedure Date: 07/20/2016 4:28 PM MRN: [id number] Endoscopist: Gatha Mayer , MD Age: 71 Referring MD:  Date of Birth: 04-24-1945 Gender: Male Account #: [id number] Procedure:                Colonoscopy Indications:              Iron deficiency anemia Medicines:                Propofol per Anesthesia, Monitored Anesthesia Care Procedure:                Pre-Anesthesia Assessment:                           - Prior to the procedure, a History and Physical                            was performed, and patient medications and                            allergies were reviewed. The patient's tolerance of                            previous anesthesia was also reviewed. The risks                            and benefits of the procedure and the sedation                            options and risks were discussed with the patient.                            All questions were answered, and informed consent                            was obtained. Prior Anticoagulants: The patient                            last took aspirin 5 days and Plavix (clopidogrel) 5                            days prior to the procedure. ASA Grade Assessment:                            III - A patient with severe systemic disease. After                            reviewing the risks and benefits, the patient was                            deemed in satisfactory condition to undergo the                            procedure.                           -  Prior to the procedure, a History and Physical                            was performed, and patient medications and                            allergies were reviewed. The patient's tolerance of                            previous anesthesia was also reviewed. The risks                            and benefits of the procedure and the sedation                            options and risks were discussed with the patient.                         All questions were answered, and informed consent                            was obtained. Prior Anticoagulants: The patient                            last took aspirin 5 days and Plavix (clopidogrel) 5                            days prior to the procedure. ASA Grade Assessment:                            III - A patient with severe systemic disease. After                            reviewing the risks and benefits, the patient was                            deemed in satisfactory condition to undergo the                            procedure.                           After obtaining informed consent, the colonoscope                            was passed under direct vision. Throughout the                            procedure, the patient's blood pressure, pulse, and                            oxygen saturations were monitored continuously. The  Colonoscope was introduced through the anus and                            advanced to the the transverse colon. The                            colonoscopy was performed with difficulty due to                            unsatisfactory bowel prep and significant looping.                            Successful completion of the procedure was aided by                            using manual pressure. The patient tolerated the                            procedure well. The quality of the bowel                            preparation was unsatisfactory. No anatomical                            landmarks were photographed. The bowel preparation                            used was Miralax. Scope In: 4:29:48 PM Scope Out: 4:46:49 PM Total Procedure Duration: 0 hours 17 minutes 1 second  Findings:                 The perianal and digital rectal examinations were                            normal.                           A 15 mm polyp was found in the transverse colon.                            The polyp  was semi-pedunculated. The polyp was                            removed with a hot snare. Resection and retrieval                            were complete. Verification of patient                            identification for the specimen was done. Estimated                            blood loss: none. Area was tattooed with an  injection of 3 mL of Spot (carbon black).                           A moderate amount of semi-solid solid stool was                            found in the rectum, in the sigmoid colon, in the                            descending colon and in the transverse colon,                            interfering with visualization. Complications:            No immediate complications. Estimated Blood Loss:     Estimated blood loss: none. Impression:               - Preparation of the colon was unsatisfactory.                           - One 15 mm polyp in the transverse colon, removed                            with a hot snare. Resected and retrieved. Tattooed.                            It was soft but ulcerated - ? inflammatory vs                            neoplastic. Could account for chronic blood loss                            and his anemia                           - Stool in the rectum, in the sigmoid colon, in the                            descending colon and in the transverse colon. Recommendation:           - Patient has a contact number available for                            emergencies. The signs and symptoms of potential                            delayed complications were discussed with the                            patient. Return to normal activities tomorrow.                            Written discharge instructions were provided to the  patient.                           - Resume previous diet.                           - Continue present medications.                           - Resume aspirin  at prior dose tomorrow.                           - Resume Plavix (clopidogrel) at prior dose in 5                            days.                           - Await pathology results.                           - Repeat colonoscopy date to be determined after                            pending pathology results are reviewed because the                            examination was incomplete and because the bowel                            preparation was poor.                           - Also with looping (low) would use abdominal                            binder x large and consider doing supine Gatha Mayer, MD 07/20/2016 5:02:14 PM This report has been signed electronically.

## 2016-07-20 NOTE — Progress Notes (Signed)
Called to room to assist during endoscopic procedure.  Patient ID and intended procedure confirmed with present staff. Received instructions for my participation in the procedure from the performing physician.  

## 2016-07-20 NOTE — Op Note (Signed)
[place] Patient Name: [name] Procedure Date: 07/20/2016 4:17 PM MRN: [id number] Endoscopist: Gatha Mayer , MD Age: 71 Referring MD:  Date of Birth: 07-07-1945 Gender: Male Account #: [id number] Procedure:                Upper GI endoscopy Indications:              Iron deficiency anemia Medicines:                Propofol per Anesthesia, Monitored Anesthesia Care Procedure:                Pre-Anesthesia Assessment:                           - Prior to the procedure, a History and Physical                            was performed, and patient medications and                            allergies were reviewed. The patient's tolerance of                            previous anesthesia was also reviewed. The risks                            and benefits of the procedure and the sedation                            options and risks were discussed with the patient.                            All questions were answered, and informed consent                            was obtained. Prior Anticoagulants: The patient                            last took aspirin 5 days and Plavix (clopidogrel) 5                            days prior to the procedure. ASA Grade Assessment:                            III - A patient with severe systemic disease. After                            reviewing the risks and benefits, the patient was                            deemed in satisfactory condition to undergo the                            procedure.  After obtaining informed consent, the endoscope was                            passed under direct vision. Throughout the                            procedure, the patient's blood pressure, pulse, and                            oxygen saturations were monitored continuously. The                            Model GIF-HQ190 [id number] scope was introduced                            through the mouth, and advanced to the  second part                            of duodenum. The upper GI endoscopy was                            accomplished without difficulty. The patient                            tolerated the procedure fairly well. Scope In: Scope Out: Findings:                 The esophagus was normal.                           The stomach was normal.                           The examined duodenum was normal.                           The cardia and gastric fundus were normal on                            retroflexion. Complications:            No immediate complications. Estimated Blood Loss:     Estimated blood loss: none. Impression:               - Normal esophagus.                           - Normal stomach.                           - Normal examined duodenum.                           - No specimens collected. Recommendation:           - Patient has a contact number available for  emergencies. The signs and symptoms of potential                            delayed complications were discussed with the                            patient. Return to normal activities tomorrow.                            Written discharge instructions were provided to the                            patient.                           - Resume previous diet.                           - Continue present medications.                           - See the other procedure note for documentation of                            additional recommendations. Gatha Mayer, MD 07/20/2016 4:55:42 PM This report has been signed electronically.

## 2016-07-20 NOTE — Patient Instructions (Addendum)
I might have found the source of bleeding - an ulcerated polyp was removed from the colon. Unfortunately the prep did not cleanse your bowels well so I need to recommend that you repeat a colonoscopy with different/extra prep. Will wait until the colon polyp pathology is reviewed and will discuss with you.  The upper endoscopy was fine.  Wait 5 more days to start the Plavix please.  I appreciate the opportunity to care for you. Gatha Mayer, MD, FACG   YOU HAD AN ENDOSCOPIC PROCEDURE TODAY AT [facility]:   Refer to the procedure report that was given to you for any specific questions about what was found during the examination.  If the procedure report does not answer your questions, please call your gastroenterologist to clarify.  If you requested that your care partner not be given the details of your procedure findings, then the procedure report has been included in a sealed envelope for you to review at your convenience later.  YOU SHOULD EXPECT: Some feelings of bloating in the abdomen. Passage of more gas than usual.  Walking can help get rid of the air that was put into your GI tract during the procedure and reduce the bloating. If you had a lower endoscopy (such as a colonoscopy or flexible sigmoidoscopy) you may notice spotting of blood in your stool or on the toilet paper. If you underwent a bowel prep for your procedure, you may not have a normal bowel movement for a few days.  Please Note:  You might notice some irritation and congestion in your nose or some drainage.  This is from the oxygen used during your procedure.  There is no need for concern and it should clear up in a day or so.  SYMPTOMS TO REPORT IMMEDIATELY:   Following lower endoscopy (colonoscopy or flexible sigmoidoscopy):  Excessive amounts of blood in the stool  Significant tenderness or worsening of abdominal pains  Swelling of the abdomen that is new, acute  Fever of 100F or  higher   Following upper endoscopy (EGD)  Vomiting of blood or coffee ground material  New chest pain or pain under the shoulder blades  Painful or persistently difficult swallowing  New shortness of breath  Fever of 100F or higher  Black, tarry-looking stools  For urgent or emergent issues, a gastroenterologist can be reached at any hour by calling [phone].   DIET:  We do recommend a small meal at first, but then you may proceed to your regular diet.  Drink plenty of fluids but you should avoid alcoholic beverages for 24 hours.  ACTIVITY:  You should plan to take it easy for the rest of today and you should NOT DRIVE or use heavy machinery until tomorrow (because of the sedation medicines used during the test).    FOLLOW UP: Our staff will call the number listed on your records the next business day following your procedure to check on you and address any questions or concerns that you may have regarding the information given to you following your procedure. If we do not reach you, we will leave a message.  However, if you are feeling well and you are not experiencing any problems, there is no need to return our call.  We will assume that you have returned to your regular daily activities without incident.  If any biopsies were taken you will be contacted by phone or by letter within the next 1-3 weeks.  Please call us  at [phone] if you have not heard about the biopsies in 3 weeks.    SIGNATURES/CONFIDENTIALITY: You and/or your care partner have signed paperwork which will be entered into your electronic medical record.  These signatures attest to the fact that that the information above on your After Visit Summary has been reviewed and is understood.  Full responsibility of the confidentiality of this discharge information lies with you and/or your care-partner.   Normal upper endoscopy.   Polyp information given.  Resume aspirin tomorrow.    Resume Plavix at prior  dose in 5 days- Monday.  J

## 2016-07-21 ENCOUNTER — Telehealth: Payer: Self-pay

## 2016-07-21 NOTE — Telephone Encounter (Signed)
  Follow up Call-  Call back number 07/20/2016  Post procedure Call Back phone  # [phone]  Permission to leave phone message Yes  Some recent data might be hidden     Patient questions:  Do you have a fever, pain , or abdominal swelling? No. Pain Score  0 *  Have you tolerated food without any problems? Yes.    Have you been able to return to your normal activities? Yes.    Do you have any questions about your discharge instructions: Diet   No. Medications  No. Follow up visit  No.  Do you have questions or concerns about your Care? No.  Actions: * If pain score is 4 or above: No action needed, pain <4.

## 2016-07-28 ENCOUNTER — Encounter (INDEPENDENT_AMBULATORY_CARE_PROVIDER_SITE_OTHER): Payer: Medicare HMO | Admitting: Ophthalmology

## 2016-07-28 NOTE — Progress Notes (Signed)
Call patient - polyp was inflammatory - no cancer potential but may have been source of blood loss He had poor prep Needs to reschedule and have a double prep and hold clopidogrel x 5 d  Needs to have extralarge abdominal binder available in [place] day we do colonoscopy Dx iron-deficiency anemia

## 2016-08-02 ENCOUNTER — Encounter (INDEPENDENT_AMBULATORY_CARE_PROVIDER_SITE_OTHER): Payer: Medicare HMO | Admitting: Ophthalmology

## 2016-08-02 DIAGNOSIS — I10 Essential (primary) hypertension: Secondary | ICD-10-CM

## 2016-08-02 DIAGNOSIS — E113512 Type 2 diabetes mellitus with proliferative diabetic retinopathy with macular edema, left eye: Secondary | ICD-10-CM

## 2016-08-02 DIAGNOSIS — H35033 Hypertensive retinopathy, bilateral: Secondary | ICD-10-CM | POA: Diagnosis not present

## 2016-08-02 DIAGNOSIS — E11311 Type 2 diabetes mellitus with unspecified diabetic retinopathy with macular edema: Secondary | ICD-10-CM | POA: Diagnosis not present

## 2016-08-02 DIAGNOSIS — E113391 Type 2 diabetes mellitus with moderate nonproliferative diabetic retinopathy without macular edema, right eye: Secondary | ICD-10-CM

## 2016-08-02 DIAGNOSIS — H43813 Vitreous degeneration, bilateral: Secondary | ICD-10-CM | POA: Diagnosis not present

## 2016-08-11 ENCOUNTER — Ambulatory Visit: Payer: Medicare HMO | Admitting: *Deleted

## 2016-08-11 ENCOUNTER — Encounter: Payer: Self-pay | Admitting: Internal Medicine

## 2016-08-11 VITALS — Ht 69.5 in | Wt 236.0 lb

## 2016-08-11 DIAGNOSIS — D509 Iron deficiency anemia, unspecified: Secondary | ICD-10-CM

## 2016-08-11 MED ORDER — SUPREP BOWEL PREP KIT 17.5-3.13-1.6 GM/177ML PO SOLN: 1.0000 | Freq: Once | ORAL | 0 refills | Status: AC

## 2016-08-11 NOTE — Progress Notes (Signed)
Patient denies any allergies to egg or soy products. Patient denies complications with anesthesia/sedation.  Patient denies oxygen use at home and denies diet medications.   

## 2016-08-30 ENCOUNTER — Encounter (INDEPENDENT_AMBULATORY_CARE_PROVIDER_SITE_OTHER): Payer: Medicare HMO | Admitting: Ophthalmology

## 2016-08-31 ENCOUNTER — Other Ambulatory Visit (INDEPENDENT_AMBULATORY_CARE_PROVIDER_SITE_OTHER): Payer: Medicare HMO

## 2016-08-31 ENCOUNTER — Ambulatory Visit (AMBULATORY_SURGERY_CENTER): Payer: Medicare HMO | Admitting: Internal Medicine

## 2016-08-31 ENCOUNTER — Encounter: Payer: Self-pay | Admitting: Internal Medicine

## 2016-08-31 VITALS — BP 104/61 | HR 55 | Temp 97.5°F | Resp 13 | Ht 70.0 in | Wt 237.0 lb

## 2016-08-31 DIAGNOSIS — D508 Other iron deficiency anemias: Secondary | ICD-10-CM

## 2016-08-31 DIAGNOSIS — E119 Type 2 diabetes mellitus without complications: Secondary | ICD-10-CM

## 2016-08-31 LAB — CBC WITH DIFFERENTIAL/PLATELET
BASOS ABS: 0.1 10*3/uL (ref 0.0–0.1)
Basophils Relative: 1.1 % (ref 0.0–3.0)
EOS ABS: 0.3 10*3/uL (ref 0.0–0.7)
Eosinophils Relative: 2.6 % (ref 0.0–5.0)
HCT: 38.2 % — ABNORMAL LOW (ref 39.0–52.0)
Hemoglobin: 12.5 g/dL — ABNORMAL LOW (ref 13.0–17.0)
LYMPHS PCT: 20.8 % (ref 12.0–46.0)
Lymphs Abs: 2.1 10*3/uL (ref 0.7–4.0)
MCHC: 32.8 g/dL (ref 30.0–36.0)
MCV: 89.7 fl (ref 78.0–100.0)
Monocytes Absolute: 1 10*3/uL (ref 0.1–1.0)
Monocytes Relative: 9.5 % (ref 3.0–12.0)
NEUTROS ABS: 6.8 10*3/uL (ref 1.4–7.7)
Neutrophils Relative %: 66 % (ref 43.0–77.0)
PLATELETS: 285 10*3/uL (ref 150.0–400.0)
RBC: 4.26 Mil/uL (ref 4.22–5.81)
RDW: 25.4 % — AB (ref 11.5–15.5)
WBC: 10.2 10*3/uL (ref 4.0–10.5)

## 2016-08-31 MED ORDER — SODIUM CHLORIDE 0.9 % IV SOLN: 500.0000 mL | INTRAVENOUS | Status: DC

## 2016-08-31 NOTE — Op Note (Signed)
[place] Patient Name: [name] Procedure Date: 08/31/2016 2:34 PM MRN: [id number] Endoscopist: Gatha Mayer , MD Age: 71 Referring MD:  Date of Birth: Nov 13, 1945 Gender: Male Account #: [id number] Procedure:                Colonoscopy Indications:              Iron deficiency anemia Medicines:                Propofol per Anesthesia, Monitored Anesthesia Care Procedure:                Pre-Anesthesia Assessment:                           - Prior to the procedure, a History and Physical                            was performed, and patient medications and                            allergies were reviewed. The patient's tolerance of                            previous anesthesia was also reviewed. The risks                            and benefits of the procedure and the sedation                            options and risks were discussed with the patient.                            All questions were answered, and informed consent                            was obtained. Prior Anticoagulants: The patient                            last took Plavix (clopidogrel) 5 days prior to the                            procedure. ASA Grade Assessment: III - A patient                            with severe systemic disease. After reviewing the                            risks and benefits, the patient was deemed in                            satisfactory condition to undergo the procedure. An                            abdominal binder was placed on his abdomen and he  had a double prep used as last time could not                            complete colonoscopy due to poor prep and looping.                           After obtaining informed consent, the colonoscope                            was passed under direct vision. Throughout the                            procedure, the patient's blood pressure, pulse, and                            oxygen  saturations were monitored continuously. The                            Colonoscope was introduced through the anus and                            advanced to the the cecum, identified by                            appendiceal orifice and ileocecal valve. The                            colonoscopy was performed without difficulty. The                            patient tolerated the procedure well. The quality                            of the bowel preparation was excellent. The                            ileocecal valve, appendiceal orifice, and rectum                            were photographed. Scope In: 2:48:19 PM Scope Out: 3:00:34 PM Scope Withdrawal Time: 0 hours 8 minutes 35 seconds  Total Procedure Duration: 0 hours 12 minutes 15 seconds  Findings:                 The perianal and digital rectal examinations were                            normal. Pertinent negatives include normal prostate                            (size, shape, and consistency).                           A tattoo was seen in the transverse colon. A  post-polypectomy scar was found at the tattoo site.                            There was no evidence of residual polyp tissue.                           The exam was otherwise without abnormality on                            direct and retroflexion views. Complications:            No immediate complications. Estimated Blood Loss:     Estimated blood loss: none. Impression:               - A tattoo was seen in the transverse colon. A                            post-polypectomy scar was found at the tattoo site.                            There was no evidence of residual polyp tissue.                           - The examination was otherwise normal on direct                            and retroflexion views.                           - No specimens collected. Recommendation:           - Patient has a contact number available for                             emergencies. The signs and symptoms of potential                            delayed complications were discussed with the                            patient. Return to normal activities tomorrow.                            Written discharge instructions were provided to the                            patient.                           - Resume previous diet.                           - Continue present medications.                           - Resume Plavix (clopidogrel) at prior dose today.                           -  No repeat colonoscopy due to age and the absence                            of advanced adenomas.                           - CBC today                           Stay on ferrous sulfate                           If ever needs a repeat colonsopy would probably                            repeat double prep and use an XXL abdominal binder                            to overcome looping problems (it worked today) Gatha Mayer, MD 08/31/2016 3:14:34 PM This report has been signed electronically.

## 2016-08-31 NOTE — Progress Notes (Signed)
Alert and oriented x3, pleased with MAC, report to RN Santiago Glad

## 2016-08-31 NOTE — Progress Notes (Signed)
Hgb better - stay on ferrous sulfate and f/u PCP re: anemia and iron-def anemia

## 2016-08-31 NOTE — Patient Instructions (Addendum)
No problems seen today - I think you were leaking blood from the inflammatory polyp.  Resume Plavix. Stay on ferrous sulfate. I am rechecking blood count today.  I do not recommend a routine repeat colonoscopy in your case.  I appreciate the opportunity to care for you. Gatha Mayer, MD, FACG   YOU HAD AN ENDOSCOPIC PROCEDURE TODAY AT [facility]:   Refer to the procedure report that was given to you for any specific questions about what was found during the examination.  If the procedure report does not answer your questions, please call your gastroenterologist to clarify.  If you requested that your care partner not be given the details of your procedure findings, then the procedure report has been included in a sealed envelope for you to review at your convenience later.  YOU SHOULD EXPECT: Some feelings of bloating in the abdomen. Passage of more gas than usual.  Walking can help get rid of the air that was put into your GI tract during the procedure and reduce the bloating. If you had a lower endoscopy (such as a colonoscopy or flexible sigmoidoscopy) you may notice spotting of blood in your stool or on the toilet paper. If you underwent a bowel prep for your procedure, you may not have a normal bowel movement for a few days.  Please Note:  You might notice some irritation and congestion in your nose or some drainage.  This is from the oxygen used during your procedure.  There is no need for concern and it should clear up in a day or so.  SYMPTOMS TO REPORT IMMEDIATELY:   Following lower endoscopy (colonoscopy or flexible sigmoidoscopy):  Excessive amounts of blood in the stool  Significant tenderness or worsening of abdominal pains  Swelling of the abdomen that is new, acute  Fever of 100F or higher  For urgent or emergent issues, a gastroenterologist can be reached at any hour by calling [phone].   DIET:  We do recommend a small meal at first,  but then you may proceed to your regular diet.  Drink plenty of fluids but you should avoid alcoholic beverages for 24 hours.  ACTIVITY:  You should plan to take it easy for the rest of today and you should NOT DRIVE or use heavy machinery until tomorrow (because of the sedation medicines used during the test).    FOLLOW UP: Our staff will call the number listed on your records the next business day following your procedure to check on you and address any questions or concerns that you may have regarding the information given to you following your procedure. If we do not reach you, we will leave a message.  However, if you are feeling well and you are not experiencing any problems, there is no need to return our call.  We will assume that you have returned to your regular daily activities without incident.  If any biopsies were taken you will be contacted by phone or by letter within the next 1-3 weeks.  Please call us at [phone] if you have not heard about the biopsies in 3 weeks.    SIGNATURES/CONFIDENTIALITY: You and/or your care partner have signed paperwork which will be entered into your electronic medical record.  These signatures attest to the fact that that the information above on your After Visit Summary has been reviewed and is understood.  Full responsibility of the confidentiality of this discharge information lies with you and/or your care-partner.

## 2016-08-31 NOTE — Progress Notes (Signed)
Pt's states no medical or surgical changes since previsit or office visit. 

## 2016-09-01 ENCOUNTER — Telehealth: Payer: Self-pay

## 2016-09-01 NOTE — Telephone Encounter (Signed)
  Follow up Call-  Call back number 08/31/2016 07/20/2016  Post procedure Call Back phone  # [phone]  Permission to leave phone message Yes Yes  Some recent data might be hidden     Patient questions:  Do you have a fever, pain , or abdominal swelling? No. Pain Score  0 *  Have you tolerated food without any problems? Yes.    Have you been able to return to your normal activities? Yes.    Do you have any questions about your discharge instructions: Diet   No. Medications  No. Follow up visit  No.  Do you have questions or concerns about your Care? No.  Actions: * If pain score is 4 or above: No action needed, pain <4.

## 2016-09-14 DIAGNOSIS — Z794 Long term (current) use of insulin: Secondary | ICD-10-CM | POA: Diagnosis not present

## 2016-09-14 DIAGNOSIS — E11621 Type 2 diabetes mellitus with foot ulcer: Secondary | ICD-10-CM | POA: Diagnosis not present

## 2016-09-14 DIAGNOSIS — L97422 Non-pressure chronic ulcer of left heel and midfoot with fat layer exposed: Secondary | ICD-10-CM | POA: Diagnosis not present

## 2016-09-15 ENCOUNTER — Encounter (INDEPENDENT_AMBULATORY_CARE_PROVIDER_SITE_OTHER): Payer: Medicare HMO | Admitting: Ophthalmology

## 2016-09-28 DIAGNOSIS — L97422 Non-pressure chronic ulcer of left heel and midfoot with fat layer exposed: Secondary | ICD-10-CM | POA: Diagnosis not present

## 2016-09-28 DIAGNOSIS — D508 Other iron deficiency anemias: Secondary | ICD-10-CM | POA: Diagnosis not present

## 2016-09-28 DIAGNOSIS — E1142 Type 2 diabetes mellitus with diabetic polyneuropathy: Secondary | ICD-10-CM | POA: Diagnosis not present

## 2016-09-28 DIAGNOSIS — E559 Vitamin D deficiency, unspecified: Secondary | ICD-10-CM | POA: Diagnosis not present

## 2016-09-28 DIAGNOSIS — Z794 Long term (current) use of insulin: Secondary | ICD-10-CM | POA: Diagnosis not present

## 2016-09-28 DIAGNOSIS — J0100 Acute maxillary sinusitis, unspecified: Secondary | ICD-10-CM | POA: Diagnosis not present

## 2016-09-28 DIAGNOSIS — H6123 Impacted cerumen, bilateral: Secondary | ICD-10-CM | POA: Diagnosis not present

## 2016-09-29 ENCOUNTER — Encounter (HOSPITAL_BASED_OUTPATIENT_CLINIC_OR_DEPARTMENT_OTHER): Payer: Medicare HMO | Attending: Internal Medicine

## 2016-09-29 DIAGNOSIS — D860 Sarcoidosis of lung: Secondary | ICD-10-CM | POA: Insufficient documentation

## 2016-09-29 DIAGNOSIS — I2510 Atherosclerotic heart disease of native coronary artery without angina pectoris: Secondary | ICD-10-CM | POA: Insufficient documentation

## 2016-09-29 DIAGNOSIS — I129 Hypertensive chronic kidney disease with stage 1 through stage 4 chronic kidney disease, or unspecified chronic kidney disease: Secondary | ICD-10-CM | POA: Diagnosis not present

## 2016-09-29 DIAGNOSIS — Z794 Long term (current) use of insulin: Secondary | ICD-10-CM | POA: Diagnosis not present

## 2016-09-29 DIAGNOSIS — L97522 Non-pressure chronic ulcer of other part of left foot with fat layer exposed: Secondary | ICD-10-CM | POA: Diagnosis not present

## 2016-09-29 DIAGNOSIS — E1122 Type 2 diabetes mellitus with diabetic chronic kidney disease: Secondary | ICD-10-CM | POA: Insufficient documentation

## 2016-09-29 DIAGNOSIS — L97429 Non-pressure chronic ulcer of left heel and midfoot with unspecified severity: Secondary | ICD-10-CM | POA: Diagnosis not present

## 2016-09-29 DIAGNOSIS — G4730 Sleep apnea, unspecified: Secondary | ICD-10-CM | POA: Diagnosis not present

## 2016-09-29 DIAGNOSIS — E11621 Type 2 diabetes mellitus with foot ulcer: Secondary | ICD-10-CM | POA: Insufficient documentation

## 2016-09-29 DIAGNOSIS — E1140 Type 2 diabetes mellitus with diabetic neuropathy, unspecified: Secondary | ICD-10-CM | POA: Diagnosis not present

## 2016-09-29 DIAGNOSIS — Z7952 Long term (current) use of systemic steroids: Secondary | ICD-10-CM | POA: Insufficient documentation

## 2016-09-29 DIAGNOSIS — N183 Chronic kidney disease, stage 3 (moderate): Secondary | ICD-10-CM | POA: Diagnosis not present

## 2016-09-30 DIAGNOSIS — M25571 Pain in right ankle and joints of right foot: Secondary | ICD-10-CM | POA: Diagnosis not present

## 2016-10-05 ENCOUNTER — Encounter (HOSPITAL_BASED_OUTPATIENT_CLINIC_OR_DEPARTMENT_OTHER): Payer: Medicare HMO | Attending: Surgery

## 2016-10-05 DIAGNOSIS — G4730 Sleep apnea, unspecified: Secondary | ICD-10-CM | POA: Insufficient documentation

## 2016-10-05 DIAGNOSIS — I10 Essential (primary) hypertension: Secondary | ICD-10-CM | POA: Diagnosis not present

## 2016-10-05 DIAGNOSIS — E1142 Type 2 diabetes mellitus with diabetic polyneuropathy: Secondary | ICD-10-CM | POA: Diagnosis not present

## 2016-10-05 DIAGNOSIS — L97522 Non-pressure chronic ulcer of other part of left foot with fat layer exposed: Secondary | ICD-10-CM | POA: Insufficient documentation

## 2016-10-05 DIAGNOSIS — I2510 Atherosclerotic heart disease of native coronary artery without angina pectoris: Secondary | ICD-10-CM | POA: Diagnosis not present

## 2016-10-05 DIAGNOSIS — E11621 Type 2 diabetes mellitus with foot ulcer: Secondary | ICD-10-CM | POA: Insufficient documentation

## 2016-10-06 ENCOUNTER — Ambulatory Visit: Payer: Medicare HMO | Admitting: Pulmonary Disease

## 2016-10-13 DIAGNOSIS — I2510 Atherosclerotic heart disease of native coronary artery without angina pectoris: Secondary | ICD-10-CM | POA: Diagnosis not present

## 2016-10-13 DIAGNOSIS — E11621 Type 2 diabetes mellitus with foot ulcer: Secondary | ICD-10-CM | POA: Diagnosis not present

## 2016-10-13 DIAGNOSIS — L97522 Non-pressure chronic ulcer of other part of left foot with fat layer exposed: Secondary | ICD-10-CM | POA: Diagnosis not present

## 2016-10-13 DIAGNOSIS — E1142 Type 2 diabetes mellitus with diabetic polyneuropathy: Secondary | ICD-10-CM | POA: Diagnosis not present

## 2016-10-13 DIAGNOSIS — G4730 Sleep apnea, unspecified: Secondary | ICD-10-CM | POA: Diagnosis not present

## 2016-10-13 DIAGNOSIS — I10 Essential (primary) hypertension: Secondary | ICD-10-CM | POA: Diagnosis not present

## 2016-10-20 DIAGNOSIS — G4730 Sleep apnea, unspecified: Secondary | ICD-10-CM | POA: Diagnosis not present

## 2016-10-20 DIAGNOSIS — E1142 Type 2 diabetes mellitus with diabetic polyneuropathy: Secondary | ICD-10-CM | POA: Diagnosis not present

## 2016-10-20 DIAGNOSIS — I2510 Atherosclerotic heart disease of native coronary artery without angina pectoris: Secondary | ICD-10-CM | POA: Diagnosis not present

## 2016-10-20 DIAGNOSIS — E11621 Type 2 diabetes mellitus with foot ulcer: Secondary | ICD-10-CM | POA: Diagnosis not present

## 2016-10-20 DIAGNOSIS — I10 Essential (primary) hypertension: Secondary | ICD-10-CM | POA: Diagnosis not present

## 2016-10-20 DIAGNOSIS — L97522 Non-pressure chronic ulcer of other part of left foot with fat layer exposed: Secondary | ICD-10-CM | POA: Diagnosis not present

## 2016-10-24 ENCOUNTER — Encounter (INDEPENDENT_AMBULATORY_CARE_PROVIDER_SITE_OTHER): Payer: Medicare HMO | Admitting: Ophthalmology

## 2016-10-24 DIAGNOSIS — H43813 Vitreous degeneration, bilateral: Secondary | ICD-10-CM

## 2016-10-24 DIAGNOSIS — E113311 Type 2 diabetes mellitus with moderate nonproliferative diabetic retinopathy with macular edema, right eye: Secondary | ICD-10-CM

## 2016-10-24 DIAGNOSIS — I10 Essential (primary) hypertension: Secondary | ICD-10-CM | POA: Diagnosis not present

## 2016-10-24 DIAGNOSIS — E11311 Type 2 diabetes mellitus with unspecified diabetic retinopathy with macular edema: Secondary | ICD-10-CM | POA: Diagnosis not present

## 2016-10-24 DIAGNOSIS — H35033 Hypertensive retinopathy, bilateral: Secondary | ICD-10-CM | POA: Diagnosis not present

## 2016-10-24 DIAGNOSIS — E113512 Type 2 diabetes mellitus with proliferative diabetic retinopathy with macular edema, left eye: Secondary | ICD-10-CM | POA: Diagnosis not present

## 2016-10-27 DIAGNOSIS — G4730 Sleep apnea, unspecified: Secondary | ICD-10-CM | POA: Diagnosis not present

## 2016-10-27 DIAGNOSIS — I2510 Atherosclerotic heart disease of native coronary artery without angina pectoris: Secondary | ICD-10-CM | POA: Diagnosis not present

## 2016-10-27 DIAGNOSIS — E1142 Type 2 diabetes mellitus with diabetic polyneuropathy: Secondary | ICD-10-CM | POA: Diagnosis not present

## 2016-10-27 DIAGNOSIS — I10 Essential (primary) hypertension: Secondary | ICD-10-CM | POA: Diagnosis not present

## 2016-10-27 DIAGNOSIS — E11621 Type 2 diabetes mellitus with foot ulcer: Secondary | ICD-10-CM | POA: Diagnosis not present

## 2016-10-27 DIAGNOSIS — L97522 Non-pressure chronic ulcer of other part of left foot with fat layer exposed: Secondary | ICD-10-CM | POA: Diagnosis not present

## 2016-11-03 DIAGNOSIS — L97522 Non-pressure chronic ulcer of other part of left foot with fat layer exposed: Secondary | ICD-10-CM | POA: Diagnosis not present

## 2016-11-03 DIAGNOSIS — E11621 Type 2 diabetes mellitus with foot ulcer: Secondary | ICD-10-CM | POA: Diagnosis not present

## 2016-11-03 DIAGNOSIS — I10 Essential (primary) hypertension: Secondary | ICD-10-CM | POA: Diagnosis not present

## 2016-11-03 DIAGNOSIS — E1142 Type 2 diabetes mellitus with diabetic polyneuropathy: Secondary | ICD-10-CM | POA: Diagnosis not present

## 2016-11-03 DIAGNOSIS — I2510 Atherosclerotic heart disease of native coronary artery without angina pectoris: Secondary | ICD-10-CM | POA: Diagnosis not present

## 2016-11-03 DIAGNOSIS — G4730 Sleep apnea, unspecified: Secondary | ICD-10-CM | POA: Diagnosis not present

## 2016-11-04 ENCOUNTER — Ambulatory Visit (HOSPITAL_COMMUNITY)
Admission: RE | Admit: 2016-11-04 | Discharge: 2016-11-04 | Disposition: A | Discharge status: Home / Self Care | Payer: Medicare HMO | Source: Ambulatory Visit | Attending: Internal Medicine | Admitting: Internal Medicine

## 2016-11-04 ENCOUNTER — Other Ambulatory Visit: Payer: Self-pay | Admitting: Internal Medicine

## 2016-11-04 DIAGNOSIS — E11621 Type 2 diabetes mellitus with foot ulcer: Secondary | ICD-10-CM

## 2016-11-04 DIAGNOSIS — L97509 Non-pressure chronic ulcer of other part of unspecified foot with unspecified severity: Principal | ICD-10-CM

## 2016-11-04 DIAGNOSIS — E1169 Type 2 diabetes mellitus with other specified complication: Principal | ICD-10-CM

## 2016-11-04 DIAGNOSIS — M869 Osteomyelitis, unspecified: Principal | ICD-10-CM

## 2016-11-04 DIAGNOSIS — L97428 Non-pressure chronic ulcer of left heel and midfoot with other specified severity: Secondary | ICD-10-CM | POA: Diagnosis not present

## 2016-11-04 IMAGING — DX DG TOE 3RD 2+V*L*
3 series · 3 of 3 positions shown · non-contrast
Comparison: [DATE] left foot radiographs.

CLINICAL DATA: Diabetic foot ulcer with left third toe
osteomyelitis.

EXAM:
LEFT THIRD TOE

[toe ap]
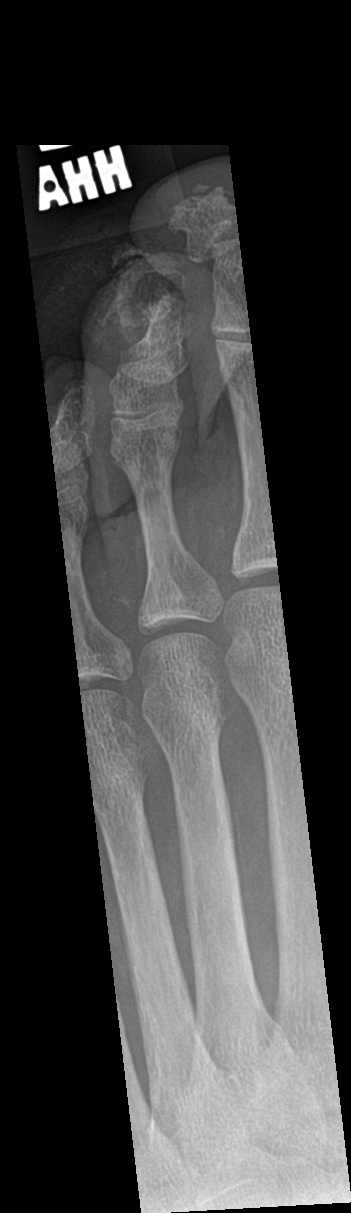

[toe obl]
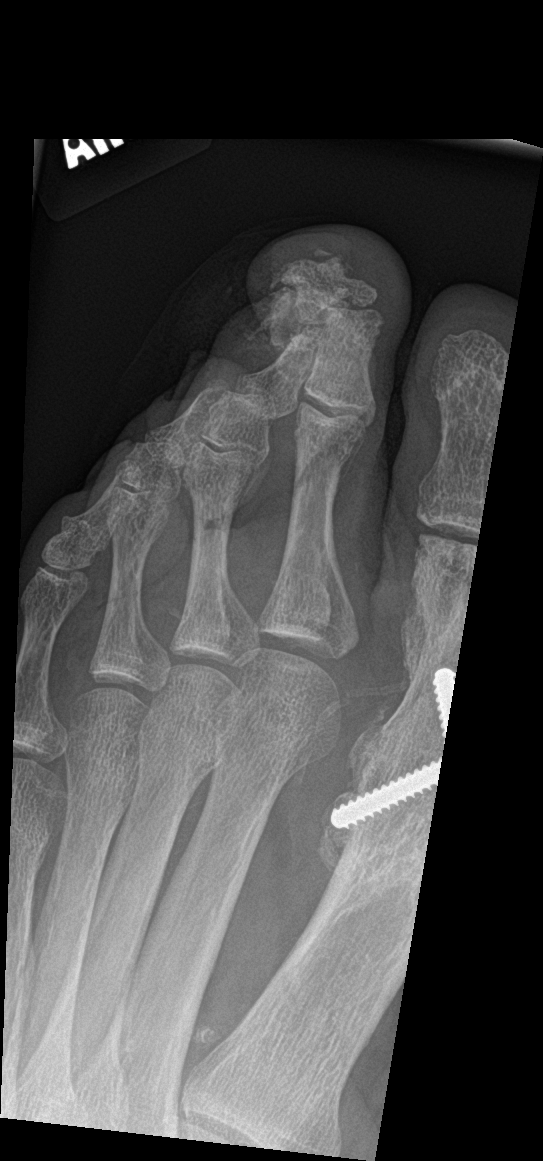

[toe lat]
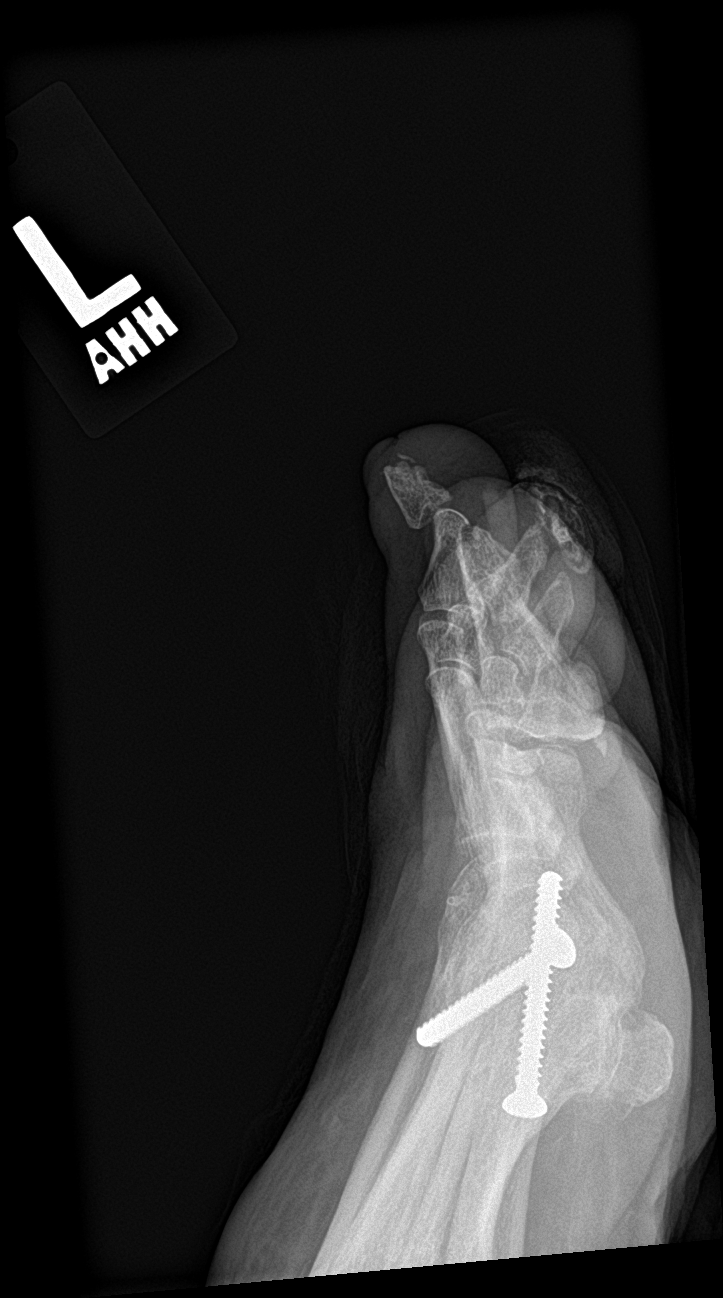

[3 of 3 positions shown; findings below may reference images not displayed]

FINDINGS: Fine osseous detail is obscured by overlying bandage. There is soft
tissue swelling in the dorsal distal left foot and in the distal
left third toe. There is a small cortical erosion in the lateral
distal tuft of the distal phalanx in the left third toe. There is
healed deformity in the distal tuft of the distal phalanx in the
left second toe with stable chronic mild dorsal subluxation of the
distal left second toe at the DIP joint. No fracture. No acute
malalignment. No suspicious focal osseous lesion. Partially
visualized intact appearing screws traversing the first
metatarsophalangeal joint. Vascular calcifications throughout the
soft tissues. No appreciable soft tissue gas.
IMPRESSION: 1. Small cortical erosion in the lateral distal tuft of the distal
phalanx in the left third toe with surrounding soft tissue swelling,
compatible with acute osteomyelitis.
2. Chronic sequela of prior osteomyelitis in the distal phalanx of
the left second toe.

## 2016-11-10 ENCOUNTER — Encounter (HOSPITAL_BASED_OUTPATIENT_CLINIC_OR_DEPARTMENT_OTHER): Payer: Medicare HMO | Attending: Internal Medicine

## 2016-11-10 DIAGNOSIS — G4730 Sleep apnea, unspecified: Secondary | ICD-10-CM | POA: Diagnosis not present

## 2016-11-10 DIAGNOSIS — L97522 Non-pressure chronic ulcer of other part of left foot with fat layer exposed: Secondary | ICD-10-CM | POA: Diagnosis not present

## 2016-11-10 DIAGNOSIS — I10 Essential (primary) hypertension: Secondary | ICD-10-CM | POA: Diagnosis not present

## 2016-11-10 DIAGNOSIS — I2510 Atherosclerotic heart disease of native coronary artery without angina pectoris: Secondary | ICD-10-CM | POA: Diagnosis not present

## 2016-11-10 DIAGNOSIS — E11621 Type 2 diabetes mellitus with foot ulcer: Secondary | ICD-10-CM | POA: Diagnosis not present

## 2016-11-10 DIAGNOSIS — E1142 Type 2 diabetes mellitus with diabetic polyneuropathy: Secondary | ICD-10-CM | POA: Diagnosis not present

## 2016-11-10 DIAGNOSIS — M869 Osteomyelitis, unspecified: Secondary | ICD-10-CM | POA: Insufficient documentation

## 2016-11-10 DIAGNOSIS — E1169 Type 2 diabetes mellitus with other specified complication: Secondary | ICD-10-CM | POA: Diagnosis not present

## 2016-11-10 DIAGNOSIS — M8618 Other acute osteomyelitis, other site: Secondary | ICD-10-CM | POA: Diagnosis not present

## 2016-11-17 DIAGNOSIS — L97522 Non-pressure chronic ulcer of other part of left foot with fat layer exposed: Secondary | ICD-10-CM | POA: Diagnosis not present

## 2016-11-17 DIAGNOSIS — M869 Osteomyelitis, unspecified: Secondary | ICD-10-CM | POA: Diagnosis not present

## 2016-11-17 DIAGNOSIS — S91105A Unspecified open wound of left lesser toe(s) without damage to nail, initial encounter: Secondary | ICD-10-CM | POA: Diagnosis not present

## 2016-11-17 DIAGNOSIS — I2510 Atherosclerotic heart disease of native coronary artery without angina pectoris: Secondary | ICD-10-CM | POA: Diagnosis not present

## 2016-11-17 DIAGNOSIS — M8668 Other chronic osteomyelitis, other site: Secondary | ICD-10-CM | POA: Diagnosis not present

## 2016-11-17 DIAGNOSIS — G4730 Sleep apnea, unspecified: Secondary | ICD-10-CM | POA: Diagnosis not present

## 2016-11-17 DIAGNOSIS — E1142 Type 2 diabetes mellitus with diabetic polyneuropathy: Secondary | ICD-10-CM | POA: Diagnosis not present

## 2016-11-17 DIAGNOSIS — E11621 Type 2 diabetes mellitus with foot ulcer: Secondary | ICD-10-CM | POA: Diagnosis not present

## 2016-11-17 DIAGNOSIS — I10 Essential (primary) hypertension: Secondary | ICD-10-CM | POA: Diagnosis not present

## 2016-11-17 DIAGNOSIS — E1169 Type 2 diabetes mellitus with other specified complication: Secondary | ICD-10-CM | POA: Diagnosis not present

## 2016-11-21 ENCOUNTER — Encounter (INDEPENDENT_AMBULATORY_CARE_PROVIDER_SITE_OTHER): Payer: Medicare HMO | Admitting: Ophthalmology

## 2016-11-21 DIAGNOSIS — H43813 Vitreous degeneration, bilateral: Secondary | ICD-10-CM | POA: Diagnosis not present

## 2016-11-21 DIAGNOSIS — E113512 Type 2 diabetes mellitus with proliferative diabetic retinopathy with macular edema, left eye: Secondary | ICD-10-CM | POA: Diagnosis not present

## 2016-11-21 DIAGNOSIS — E113311 Type 2 diabetes mellitus with moderate nonproliferative diabetic retinopathy with macular edema, right eye: Secondary | ICD-10-CM

## 2016-11-21 DIAGNOSIS — H35033 Hypertensive retinopathy, bilateral: Secondary | ICD-10-CM

## 2016-11-21 DIAGNOSIS — E11311 Type 2 diabetes mellitus with unspecified diabetic retinopathy with macular edema: Secondary | ICD-10-CM

## 2016-11-21 DIAGNOSIS — I10 Essential (primary) hypertension: Secondary | ICD-10-CM | POA: Diagnosis not present

## 2016-11-24 DIAGNOSIS — E11621 Type 2 diabetes mellitus with foot ulcer: Secondary | ICD-10-CM | POA: Diagnosis not present

## 2016-11-24 DIAGNOSIS — G4730 Sleep apnea, unspecified: Secondary | ICD-10-CM | POA: Diagnosis not present

## 2016-11-24 DIAGNOSIS — E1169 Type 2 diabetes mellitus with other specified complication: Secondary | ICD-10-CM | POA: Diagnosis not present

## 2016-11-24 DIAGNOSIS — E1142 Type 2 diabetes mellitus with diabetic polyneuropathy: Secondary | ICD-10-CM | POA: Diagnosis not present

## 2016-11-24 DIAGNOSIS — L97522 Non-pressure chronic ulcer of other part of left foot with fat layer exposed: Secondary | ICD-10-CM | POA: Diagnosis not present

## 2016-11-24 DIAGNOSIS — M869 Osteomyelitis, unspecified: Secondary | ICD-10-CM | POA: Diagnosis not present

## 2016-11-24 DIAGNOSIS — I10 Essential (primary) hypertension: Secondary | ICD-10-CM | POA: Diagnosis not present

## 2016-11-24 DIAGNOSIS — M8618 Other acute osteomyelitis, other site: Secondary | ICD-10-CM | POA: Diagnosis not present

## 2016-11-24 DIAGNOSIS — I2510 Atherosclerotic heart disease of native coronary artery without angina pectoris: Secondary | ICD-10-CM | POA: Diagnosis not present

## 2016-11-24 DIAGNOSIS — S91105A Unspecified open wound of left lesser toe(s) without damage to nail, initial encounter: Secondary | ICD-10-CM | POA: Diagnosis not present

## 2016-12-01 DIAGNOSIS — G4730 Sleep apnea, unspecified: Secondary | ICD-10-CM | POA: Diagnosis not present

## 2016-12-01 DIAGNOSIS — M869 Osteomyelitis, unspecified: Secondary | ICD-10-CM | POA: Diagnosis not present

## 2016-12-01 DIAGNOSIS — I10 Essential (primary) hypertension: Secondary | ICD-10-CM | POA: Diagnosis not present

## 2016-12-01 DIAGNOSIS — E11621 Type 2 diabetes mellitus with foot ulcer: Secondary | ICD-10-CM | POA: Diagnosis not present

## 2016-12-01 DIAGNOSIS — E1169 Type 2 diabetes mellitus with other specified complication: Secondary | ICD-10-CM | POA: Diagnosis not present

## 2016-12-01 DIAGNOSIS — L97522 Non-pressure chronic ulcer of other part of left foot with fat layer exposed: Secondary | ICD-10-CM | POA: Diagnosis not present

## 2016-12-01 DIAGNOSIS — I2510 Atherosclerotic heart disease of native coronary artery without angina pectoris: Secondary | ICD-10-CM | POA: Diagnosis not present

## 2016-12-01 DIAGNOSIS — E1142 Type 2 diabetes mellitus with diabetic polyneuropathy: Secondary | ICD-10-CM | POA: Diagnosis not present

## 2016-12-06 DIAGNOSIS — J069 Acute upper respiratory infection, unspecified: Secondary | ICD-10-CM | POA: Diagnosis not present

## 2016-12-06 DIAGNOSIS — Z6834 Body mass index (BMI) 34.0-34.9, adult: Secondary | ICD-10-CM | POA: Diagnosis not present

## 2016-12-09 ENCOUNTER — Encounter (INDEPENDENT_AMBULATORY_CARE_PROVIDER_SITE_OTHER): Payer: Medicare HMO | Admitting: Ophthalmology

## 2016-12-09 DIAGNOSIS — E113211 Type 2 diabetes mellitus with mild nonproliferative diabetic retinopathy with macular edema, right eye: Secondary | ICD-10-CM | POA: Diagnosis not present

## 2016-12-09 DIAGNOSIS — E11311 Type 2 diabetes mellitus with unspecified diabetic retinopathy with macular edema: Secondary | ICD-10-CM

## 2016-12-16 ENCOUNTER — Encounter (HOSPITAL_BASED_OUTPATIENT_CLINIC_OR_DEPARTMENT_OTHER): Payer: Medicare HMO | Attending: Internal Medicine

## 2016-12-16 DIAGNOSIS — I10 Essential (primary) hypertension: Secondary | ICD-10-CM | POA: Diagnosis not present

## 2016-12-16 DIAGNOSIS — I2510 Atherosclerotic heart disease of native coronary artery without angina pectoris: Secondary | ICD-10-CM | POA: Insufficient documentation

## 2016-12-16 DIAGNOSIS — G4730 Sleep apnea, unspecified: Secondary | ICD-10-CM | POA: Insufficient documentation

## 2016-12-16 DIAGNOSIS — E11621 Type 2 diabetes mellitus with foot ulcer: Secondary | ICD-10-CM | POA: Diagnosis not present

## 2016-12-16 DIAGNOSIS — L84 Corns and callosities: Secondary | ICD-10-CM | POA: Diagnosis not present

## 2016-12-16 DIAGNOSIS — L97522 Non-pressure chronic ulcer of other part of left foot with fat layer exposed: Secondary | ICD-10-CM | POA: Insufficient documentation

## 2016-12-16 DIAGNOSIS — E1142 Type 2 diabetes mellitus with diabetic polyneuropathy: Secondary | ICD-10-CM | POA: Diagnosis not present

## 2016-12-23 DIAGNOSIS — I10 Essential (primary) hypertension: Secondary | ICD-10-CM | POA: Diagnosis not present

## 2016-12-23 DIAGNOSIS — I2510 Atherosclerotic heart disease of native coronary artery without angina pectoris: Secondary | ICD-10-CM | POA: Diagnosis not present

## 2016-12-23 DIAGNOSIS — E1142 Type 2 diabetes mellitus with diabetic polyneuropathy: Secondary | ICD-10-CM | POA: Diagnosis not present

## 2016-12-23 DIAGNOSIS — G4730 Sleep apnea, unspecified: Secondary | ICD-10-CM | POA: Diagnosis not present

## 2016-12-23 DIAGNOSIS — L84 Corns and callosities: Secondary | ICD-10-CM | POA: Diagnosis not present

## 2016-12-23 DIAGNOSIS — E11621 Type 2 diabetes mellitus with foot ulcer: Secondary | ICD-10-CM | POA: Diagnosis not present

## 2016-12-23 DIAGNOSIS — L97522 Non-pressure chronic ulcer of other part of left foot with fat layer exposed: Secondary | ICD-10-CM | POA: Diagnosis not present

## 2016-12-29 DIAGNOSIS — Z23 Encounter for immunization: Secondary | ICD-10-CM | POA: Diagnosis not present

## 2016-12-29 DIAGNOSIS — G4733 Obstructive sleep apnea (adult) (pediatric): Secondary | ICD-10-CM | POA: Diagnosis not present

## 2016-12-29 DIAGNOSIS — J0100 Acute maxillary sinusitis, unspecified: Secondary | ICD-10-CM | POA: Diagnosis not present

## 2016-12-29 DIAGNOSIS — I10 Essential (primary) hypertension: Secondary | ICD-10-CM | POA: Diagnosis not present

## 2016-12-29 DIAGNOSIS — D508 Other iron deficiency anemias: Secondary | ICD-10-CM | POA: Diagnosis not present

## 2016-12-29 DIAGNOSIS — E039 Hypothyroidism, unspecified: Secondary | ICD-10-CM | POA: Diagnosis not present

## 2016-12-29 DIAGNOSIS — N183 Chronic kidney disease, stage 3 (moderate): Secondary | ICD-10-CM | POA: Diagnosis not present

## 2016-12-29 DIAGNOSIS — E7800 Pure hypercholesterolemia, unspecified: Secondary | ICD-10-CM | POA: Diagnosis not present

## 2016-12-29 DIAGNOSIS — E1165 Type 2 diabetes mellitus with hyperglycemia: Secondary | ICD-10-CM | POA: Diagnosis not present

## 2016-12-30 DIAGNOSIS — I2510 Atherosclerotic heart disease of native coronary artery without angina pectoris: Secondary | ICD-10-CM | POA: Diagnosis not present

## 2016-12-30 DIAGNOSIS — E11621 Type 2 diabetes mellitus with foot ulcer: Secondary | ICD-10-CM | POA: Diagnosis not present

## 2016-12-30 DIAGNOSIS — L84 Corns and callosities: Secondary | ICD-10-CM | POA: Diagnosis not present

## 2016-12-30 DIAGNOSIS — E1142 Type 2 diabetes mellitus with diabetic polyneuropathy: Secondary | ICD-10-CM | POA: Diagnosis not present

## 2016-12-30 DIAGNOSIS — L97522 Non-pressure chronic ulcer of other part of left foot with fat layer exposed: Secondary | ICD-10-CM | POA: Diagnosis not present

## 2016-12-30 DIAGNOSIS — G4730 Sleep apnea, unspecified: Secondary | ICD-10-CM | POA: Diagnosis not present

## 2016-12-30 DIAGNOSIS — I10 Essential (primary) hypertension: Secondary | ICD-10-CM | POA: Diagnosis not present

## 2017-01-02 ENCOUNTER — Encounter (INDEPENDENT_AMBULATORY_CARE_PROVIDER_SITE_OTHER): Payer: Medicare HMO | Admitting: Ophthalmology

## 2017-01-02 DIAGNOSIS — E113512 Type 2 diabetes mellitus with proliferative diabetic retinopathy with macular edema, left eye: Secondary | ICD-10-CM | POA: Diagnosis not present

## 2017-01-02 DIAGNOSIS — E113311 Type 2 diabetes mellitus with moderate nonproliferative diabetic retinopathy with macular edema, right eye: Secondary | ICD-10-CM | POA: Diagnosis not present

## 2017-01-02 DIAGNOSIS — I10 Essential (primary) hypertension: Secondary | ICD-10-CM | POA: Diagnosis not present

## 2017-01-02 DIAGNOSIS — E11311 Type 2 diabetes mellitus with unspecified diabetic retinopathy with macular edema: Secondary | ICD-10-CM

## 2017-01-02 DIAGNOSIS — H43813 Vitreous degeneration, bilateral: Secondary | ICD-10-CM

## 2017-01-02 DIAGNOSIS — H35033 Hypertensive retinopathy, bilateral: Secondary | ICD-10-CM

## 2017-01-06 ENCOUNTER — Encounter (HOSPITAL_BASED_OUTPATIENT_CLINIC_OR_DEPARTMENT_OTHER): Payer: Medicare HMO | Attending: Internal Medicine

## 2017-01-06 DIAGNOSIS — Z8631 Personal history of diabetic foot ulcer: Secondary | ICD-10-CM | POA: Insufficient documentation

## 2017-01-06 DIAGNOSIS — I2510 Atherosclerotic heart disease of native coronary artery without angina pectoris: Secondary | ICD-10-CM | POA: Insufficient documentation

## 2017-01-06 DIAGNOSIS — G4730 Sleep apnea, unspecified: Secondary | ICD-10-CM | POA: Diagnosis not present

## 2017-01-06 DIAGNOSIS — I10 Essential (primary) hypertension: Secondary | ICD-10-CM | POA: Insufficient documentation

## 2017-01-06 DIAGNOSIS — Z09 Encounter for follow-up examination after completed treatment for conditions other than malignant neoplasm: Secondary | ICD-10-CM | POA: Diagnosis not present

## 2017-01-06 DIAGNOSIS — S91105A Unspecified open wound of left lesser toe(s) without damage to nail, initial encounter: Secondary | ICD-10-CM | POA: Diagnosis not present

## 2017-01-06 DIAGNOSIS — E1140 Type 2 diabetes mellitus with diabetic neuropathy, unspecified: Secondary | ICD-10-CM | POA: Insufficient documentation

## 2017-01-17 DIAGNOSIS — L821 Other seborrheic keratosis: Secondary | ICD-10-CM | POA: Diagnosis not present

## 2017-01-17 DIAGNOSIS — D229 Melanocytic nevi, unspecified: Secondary | ICD-10-CM | POA: Diagnosis not present

## 2017-01-17 DIAGNOSIS — L570 Actinic keratosis: Secondary | ICD-10-CM | POA: Diagnosis not present

## 2017-01-17 DIAGNOSIS — L814 Other melanin hyperpigmentation: Secondary | ICD-10-CM | POA: Diagnosis not present

## 2017-02-13 ENCOUNTER — Encounter (INDEPENDENT_AMBULATORY_CARE_PROVIDER_SITE_OTHER): Payer: Medicare HMO | Admitting: Ophthalmology

## 2017-02-15 ENCOUNTER — Encounter (INDEPENDENT_AMBULATORY_CARE_PROVIDER_SITE_OTHER): Payer: Medicare HMO | Admitting: Ophthalmology

## 2017-03-14 ENCOUNTER — Ambulatory Visit (INDEPENDENT_AMBULATORY_CARE_PROVIDER_SITE_OTHER): Payer: Medicare HMO

## 2017-03-14 ENCOUNTER — Ambulatory Visit (INDEPENDENT_AMBULATORY_CARE_PROVIDER_SITE_OTHER): Payer: Medicare HMO | Admitting: Orthopedic Surgery

## 2017-03-14 ENCOUNTER — Encounter (INDEPENDENT_AMBULATORY_CARE_PROVIDER_SITE_OTHER): Payer: Self-pay | Admitting: Orthopedic Surgery

## 2017-03-14 DIAGNOSIS — M86172 Other acute osteomyelitis, left ankle and foot: Secondary | ICD-10-CM

## 2017-03-14 DIAGNOSIS — I739 Peripheral vascular disease, unspecified: Secondary | ICD-10-CM | POA: Diagnosis not present

## 2017-03-14 MED ORDER — SULFAMETHOXAZOLE-TRIMETHOPRIM 800-160 MG PO TABS: 1.0000 | ORAL_TABLET | Freq: Two times a day (BID) | ORAL | 0 refills | Status: DC

## 2017-03-14 NOTE — Progress Notes (Signed)
Office Visit Note   Patient: [name]           Date of Birth: November 17, 1945           MRN: [id number] Visit Date: 03/14/2017              Requested by: Shirline Frees, MD [place], [place] [address] PCP: Shirline Frees, MD  Chief Complaint  Patient presents with  . Left 3rd Toe - Open Wound      HPI: Patient is a 72 year old gentleman who is seen for initial evaluation for chronic ulcer left foot third toe.  Patient states he initially obtained the ulcer when walking on hot pavement in July.  Patient states that July and August he was treated with oral antibiotics he thinks this was Augmentin he has been using in the form dressings he recently has not been on antibiotics he said he has been treated with hyperbaric oxygen at the wound center and is seen today for initial evaluation.  He states the ulcer continues to breakdown and open.  Patient states his hemoglobin A1c was about 7.1.  Assessment & Plan: Visit Diagnoses:  1. Acute osteomyelitis of toe of left foot ([place])     Plan: We will start him on sulfamethoxazole trimethoprim twice daily for 4 weeks follow-up in 4 weeks for repeat evaluation.  Discussed that if there is not any improvement in the redness swelling and ulceration that we may need to consider surgical intervention.  Advance to soft soled sneakers and discontinue the hard postoperative shoe.  Recommended probiotics to minimize risk of GI symptoms with the antibiotics.  Follow-Up Instructions: Return in about 4 weeks (around 04/11/2017).   Ortho Exam  Patient is alert, oriented, no adenopathy, well-dressed, normal affect, normal respiratory effort. Examination patient has a good dorsalis pedis pulse.  He has good ankle and subtalar motion there is redness and swelling of the third toe there is a little bit of swelling of the second toe.  The ulcer probes down to bone.  Of note patient's last recorded hemoglobin A1c was 12.2.  Imaging: Xr  Toe 3rd Left  Result Date: 03/14/2017 3 view radiographs of the left forefoot shows a fusion of the great toe MTP joint radiographs of the third toe shows swelling the silver nitrate extends down to the tip of the tuft of the toe and there is some destructive bony changes of the tuft of the third toe.  Patient also has significant peripheral vascular disease with calcification of the vessels out to the toes.  No images are attached to the encounter.  Labs: Lab Results  Component Value Date   HGBA1C (H) 08/15/2008    12.2 (NOTE) The ADA recommends the following therapeutic goal for glycemic control related to Hgb A1c measurement: Goal of therapy: <6.5 Hgb A1c  Reference: American Diabetes Association: Clinical Practice Recommendations 2010, Diabetes Care, 2010, 33: (Suppl  1).   HGBA1C (H) 11/27/2007    9.5 (NOTE)   The ADA recommends the following therapeutic goal for glycemic   control related to Hgb A1C measurement:   Goal of Therapy:   < 7.0% Hgb A1C   Reference: American Diabetes Association: Clinical Practice   Recommendations 2008, Diabetes Care,  2008, 31:(Suppl 1).   REPTSTATUS 11/10/2010 FINAL 11/09/2010   CULT NO GROWTH 11/09/2010    @LABSALLVALUES (HGBA1)@  There is no height or weight on file to calculate BMI.  Orders:  Orders Placed This Encounter  Procedures  .  XR Toe 3rd Left   Meds ordered this encounter  Medications  . DISCONTD: sulfamethoxazole-trimethoprim (BACTRIM DS,SEPTRA DS) 800-160 MG tablet    Sig: Take 1 tablet by mouth 2 (two) times daily.    Dispense:  60 tablet    Refill:  0  . sulfamethoxazole-trimethoprim (BACTRIM DS,SEPTRA DS) 800-160 MG tablet    Sig: Take 1 tablet by mouth 2 (two) times daily.    Dispense:  60 tablet    Refill:  0     Procedures: No procedures performed  Clinical Data: No additional findings.  ROS:  All other systems negative, except as noted in the HPI. Review of Systems  Objective: Vital Signs: There were no  vitals taken for this visit.  Specialty Comments:  No specialty comments available.  PMFS History: Patient Active Problem List   Diagnosis Date Noted  . CKD (chronic kidney disease), stage III ([place])   . Iron deficiency anemia   . Symptomatic anemia 06/29/2016  . Chronic diastolic heart failure ([place]) 01/14/2014  . Stable angina ([place]) 01/14/2014  . Obesity 01/14/2014  . Pulmonary sarcoidosis ([place]) 01/14/2014  . Uncontrolled diabetes mellitus ([place]) 01/14/2014  . Hyperlipidemia 01/14/2014  . Coronary atherosclerosis of native coronary artery   . Sinusitis, chronic 05/30/2011  . Sleep apnea 10/09/2008  . ADRENAL INSUFFICIENCY, HX OF 02/21/2008  . Coronary atherosclerosis 11/20/2007  . Sarcoidosis 02/20/2007  . Diabetes mellitus without complication ([place]) 23/53/6144  . GERD 12/20/2006   Past Medical History:  Diagnosis Date  . Adrenal insufficiency ([place])   . Allergy   . Anemia   . Arthritis    feet   . CKD (chronic kidney disease), stage III ([place])   . Coronary artery disease    has stents  . Coronary atherosclerosis of native coronary artery    Proximal LAD, posterior lateral stent widely patent-10/12/11  . Diabetes mellitus    insulin and pills  . Hearing loss    wears hearing aids  . Heart attack ([place]) 2010  . History of blood transfusion 06/30/2016   [facility] - 2 units transfused  . Hypertension   . OSA (obstructive sleep apnea)    uses VPAC sleep study 2 years done through [facility]. Dr. Marlou Porch arranged study  . Pneumonia    3-4 years ago  . Sarcoid   . Sleep apnea    uses CPAP nightly  . Thyroid disease   . Type 2 diabetes mellitus (HCC)     Family History  Problem Relation Age of Onset  . Kidney disease Mother   . Diabetes Mother   . Kidney cancer Mother   . Heart attack Father   . Asthma Sister   . Anesthesia problems Sister        "Kidney's did not wake up"  . Colon cancer Neg Hx   . Colon polyps Neg Hx   . Rectal cancer Neg Hx   . Stomach cancer Neg  Hx     Past Surgical History:  Procedure Laterality Date  . APPENDECTOMY    . CATARACT EXTRACTION  2011   bilat  . CHOLECYSTECTOMY  01/25/2011   Procedure: LAPAROSCOPIC CHOLECYSTECTOMY WITH INTRAOPERATIVE CHOLANGIOGRAM;  Surgeon: Judieth Keens, DO;  Location: [facility];  Service: General;  Laterality: N/A;  . COLONOSCOPY     several  . CORONARY ANGIOPLASTY     most recent 11/2009  . CORONARY STENT PLACEMENT  2009   in LAD and side branch PTCA  . INTERCOSTAL NERVE BLOCK  2011, 06/2016  x2. lumbar spine  . LEFT HEART CATHETERIZATION WITH CORONARY ANGIOGRAM Bilateral 10/12/2011   Procedure: LEFT HEART CATHETERIZATION WITH CORONARY ANGIOGRAM;  Surgeon: Candee Furbish, MD;  Location: [facility] CATH LAB;  Service: Cardiovascular;  Laterality: Bilateral;  . LEFT HEART CATHETERIZATION WITH CORONARY ANGIOGRAM N/A 04/01/2014   Procedure: LEFT HEART CATHETERIZATION WITH CORONARY ANGIOGRAM;  Surgeon: Candee Furbish, MD;  Location: [facility] C[facility] LAB;  Service: Cardiovascular;  Laterality: N/A;   Social History   Occupational History  . 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 Smoking status: Never Smoker  . Smokeless tobacco: Never Used  Substance and Sexual Activity  . Alcohol use: No  . Drug use: No  . Sexual activity: Not on file

## 2017-03-21 DIAGNOSIS — E7800 Pure hypercholesterolemia, unspecified: Secondary | ICD-10-CM | POA: Diagnosis not present

## 2017-03-21 DIAGNOSIS — E1122 Type 2 diabetes mellitus with diabetic chronic kidney disease: Secondary | ICD-10-CM | POA: Diagnosis not present

## 2017-03-21 DIAGNOSIS — I10 Essential (primary) hypertension: Secondary | ICD-10-CM | POA: Diagnosis not present

## 2017-03-21 DIAGNOSIS — E039 Hypothyroidism, unspecified: Secondary | ICD-10-CM | POA: Diagnosis not present

## 2017-03-21 DIAGNOSIS — N183 Chronic kidney disease, stage 3 (moderate): Secondary | ICD-10-CM | POA: Diagnosis not present

## 2017-03-21 DIAGNOSIS — E1165 Type 2 diabetes mellitus with hyperglycemia: Secondary | ICD-10-CM | POA: Diagnosis not present

## 2017-03-22 ENCOUNTER — Encounter (INDEPENDENT_AMBULATORY_CARE_PROVIDER_SITE_OTHER): Payer: Medicare HMO | Admitting: Ophthalmology

## 2017-03-22 DIAGNOSIS — E11311 Type 2 diabetes mellitus with unspecified diabetic retinopathy with macular edema: Secondary | ICD-10-CM

## 2017-03-22 DIAGNOSIS — H35033 Hypertensive retinopathy, bilateral: Secondary | ICD-10-CM | POA: Diagnosis not present

## 2017-03-22 DIAGNOSIS — I10 Essential (primary) hypertension: Secondary | ICD-10-CM

## 2017-03-22 DIAGNOSIS — H43813 Vitreous degeneration, bilateral: Secondary | ICD-10-CM | POA: Diagnosis not present

## 2017-03-22 DIAGNOSIS — E113512 Type 2 diabetes mellitus with proliferative diabetic retinopathy with macular edema, left eye: Secondary | ICD-10-CM

## 2017-03-22 DIAGNOSIS — E113311 Type 2 diabetes mellitus with moderate nonproliferative diabetic retinopathy with macular edema, right eye: Secondary | ICD-10-CM

## 2017-04-12 ENCOUNTER — Ambulatory Visit (INDEPENDENT_AMBULATORY_CARE_PROVIDER_SITE_OTHER): Payer: Medicare HMO | Admitting: Orthopedic Surgery

## 2017-04-14 ENCOUNTER — Ambulatory Visit (INDEPENDENT_AMBULATORY_CARE_PROVIDER_SITE_OTHER): Payer: Medicare HMO | Admitting: Family

## 2017-04-14 ENCOUNTER — Encounter (INDEPENDENT_AMBULATORY_CARE_PROVIDER_SITE_OTHER): Payer: Self-pay | Admitting: Family

## 2017-04-14 VITALS — Ht 70.0 in | Wt 237.0 lb

## 2017-04-14 DIAGNOSIS — L97529 Non-pressure chronic ulcer of other part of left foot with unspecified severity: Secondary | ICD-10-CM

## 2017-04-14 DIAGNOSIS — E11621 Type 2 diabetes mellitus with foot ulcer: Secondary | ICD-10-CM

## 2017-04-14 NOTE — Progress Notes (Signed)
Office Visit Note   Patient: [name]           Date of Birth: 1945-07-13           MRN: [id number] Visit Date: 04/14/2017              Requested by: Shirline Frees, MD [address] [place], [place] [address] PCP: Shirline Frees, MD  Chief Complaint  Patient presents with  . Left Foot - Follow-up    3rd toe amputation       HPI: Patient is a 72 year old gentleman who is seen in follow up for chronic ulcer left foot third toe. Has just completed a 30 day course of Bactrim. Is pleased the ulcer has healed. His wife has been doing his wound care. They are pleased with healing. They would like to continue with conservative measures.  Patient states he initially obtained the ulcer when walking on hot pavement in July.  Patient states that July and August he was treated with oral antibiotics he thinks this was Augmentin he has been using in the form dressings he recently has not been on antibiotics he said he has been treated with hyperbaric oxygen at the wound center.  He states the ulcer continues to breakdown and open.  Patient states his hemoglobin A1c was about 7.1.  Assessment & Plan: Visit Diagnoses:  1. Diabetic ulcer of toe of left foot associated with type 2 diabetes mellitus, unspecified ulcer stage ([place])     Plan: Patient would like to continue with current care. Will follow up in office with Dr. Sharol Given in several weeks for repeat evaluation. Did discuss return precautions and likely eventual need to consider surgical intervention for 3rd toe.  Advance to soft soled sneakers and discontinue the hard postoperative shoe.   Offered MRI of left foot to evaluate for osteomyelitis of 3rd toe, patient and wife would to hold off at this time.   Follow-Up Instructions: Return in about 4 weeks (around 05/12/2017).   Ortho Exam  Patient is alert, oriented, no adenopathy, well-dressed, normal affect, normal respiratory effort. Examination patient has a good dorsalis  pedis pulse.  He has good ankle and subtalar motion there is redness and swelling of the third toe there is a little bit of swelling of the second toe.  The ulcer probes down to bone.  Of note patient's last recorded hemoglobin A1c was 12.2.  Imaging: No results found. No images are attached to the encounter.  Labs: Lab Results  Component Value Date   HGBA1C (H) 08/15/2008    12.2 (NOTE) The ADA recommends the following therapeutic goal for glycemic control related to Hgb A1c measurement: Goal of therapy: <6.5 Hgb A1c  Reference: American Diabetes Association: Clinical Practice Recommendations 2010, Diabetes Care, 2010, 33: (Suppl  1).   HGBA1C (H) 11/27/2007    9.5 (NOTE)   The ADA recommends the following therapeutic goal for glycemic   control related to Hgb A1C measurement:   Goal of Therapy:   < 7.0% Hgb A1C   Reference: American Diabetes Association: Clinical Practice   Recommendations 2008, Diabetes Care,  2008, 31:(Suppl 1).   REPTSTATUS 11/10/2010 FINAL 11/09/2010   CULT NO GROWTH 11/09/2010    @LABSALLVALUES (HGBA1)@  Body mass index is 34.01 kg/m.  Orders:  No orders of the defined types were placed in this encounter.  No orders of the defined types were placed in this encounter.    Procedures: No procedures performed  Clinical Data: No  additional findings.  ROS:  All other systems negative, except as noted in the HPI. Review of Systems  Constitutional: Negative for chills and fever.  Skin: Positive for wound. Negative for color change.    Objective: Vital Signs: Ht 5\' 10"  (1.778 m)   Wt 237 lb (107.5 kg)   BMI 34.01 kg/m   Specialty Comments:  No specialty comments available.  PMFS History: Patient Active Problem List   Diagnosis Date Noted  . CKD (chronic kidney disease), stage III ([place])   . Iron deficiency anemia   . Symptomatic anemia 06/29/2016  . Chronic diastolic heart failure ([place]) 01/14/2014  . Stable angina ([place]) 01/14/2014  . Obesity  01/14/2014  . Pulmonary sarcoidosis ([place]) 01/14/2014  . Uncontrolled diabetes mellitus ([place]) 01/14/2014  . Hyperlipidemia 01/14/2014  . Coronary atherosclerosis of native coronary artery   . Sinusitis, chronic 05/30/2011  . Sleep apnea 10/09/2008  . ADRENAL INSUFFICIENCY, HX OF 02/21/2008  . Coronary atherosclerosis 11/20/2007  . Sarcoidosis 02/20/2007  . Diabetes mellitus without complication ([place]) 60/63/0160  . GERD 12/20/2006   Past Medical History:  Diagnosis Date  . Adrenal insufficiency ([place])   . Allergy   . Anemia   . Arthritis    feet   . CKD (chronic kidney disease), stage III ([place])   . Coronary artery disease    has stents  . Coronary atherosclerosis of native coronary artery    Proximal LAD, posterior lateral stent widely patent-10/12/11  . Diabetes mellitus    insulin and pills  . Hearing loss    wears hearing aids  . Heart attack ([place]) 2010  . History of blood transfusion 06/30/2016   [facility] - 2 units transfused  . Hypertension   . OSA (obstructive sleep apnea)    uses VPAC sleep study 2 years done through [facility]. Dr. Marlou Porch arranged study  . Pneumonia    3-4 years ago  . Sarcoid   . Sleep apnea    uses CPAP nightly  . Thyroid disease   . Type 2 diabetes mellitus (HCC)     Family History  Problem Relation Age of Onset  . Kidney disease Mother   . Diabetes Mother   . Kidney cancer Mother   . Heart attack Father   . Asthma Sister   . Anesthesia problems Sister        "Kidney's did not wake up"  . Colon cancer Neg Hx   . Colon polyps Neg Hx   . Rectal cancer Neg Hx   . Stomach cancer Neg Hx     Past Surgical History:  Procedure Laterality Date  . APPENDECTOMY    . CATARACT EXTRACTION  2011   bilat  . CHOLECYSTECTOMY  01/25/2011   Procedure: LAPAROSCOPIC CHOLECYSTECTOMY WITH INTRAOPERATIVE CHOLANGIOGRAM;  Surgeon: Judieth Keens, DO;  Location: [facility];  Service: General;  Laterality: N/A;  . COLONOSCOPY     several  . CORONARY  ANGIOPLASTY     most recent 11/2009  . CORONARY STENT PLACEMENT  2009   in LAD and side branch PTCA  . INTERCOSTAL NERVE BLOCK  2011, 06/2016   x2. lumbar spine  . LEFT HEART CATHETERIZATION WITH CORONARY ANGIOGRAM Bilateral 10/12/2011   Procedure: LEFT HEART CATHETERIZATION WITH CORONARY ANGIOGRAM;  Surgeon: Candee Furbish, MD;  Location: [facility] CATH LAB;  Service: Cardiovascular;  Laterality: Bilateral;  . LEFT HEART CATHETERIZATION WITH CORONARY ANGIOGRAM N/A 04/01/2014   Procedure: LEFT HEART CATHETERIZATION WITH CORONARY ANGIOGRAM;  Surgeon: Candee Furbish, MD;  Location: [facility]  CATH LAB;  Service: Cardiovascular;  Laterality: N/A;   Social History   Occupational History  . 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 Smoking status: Never Smoker  . Smokeless tobacco: Never Used  Substance and Sexual Activity  . Alcohol use: No  . Drug use: No  . Sexual activity: Not on file

## 2017-05-01 ENCOUNTER — Ambulatory Visit (INDEPENDENT_AMBULATORY_CARE_PROVIDER_SITE_OTHER): Payer: Medicare HMO | Admitting: Orthopedic Surgery

## 2017-05-01 ENCOUNTER — Encounter (INDEPENDENT_AMBULATORY_CARE_PROVIDER_SITE_OTHER): Payer: Self-pay | Admitting: Orthopedic Surgery

## 2017-05-01 VITALS — Ht 70.0 in | Wt 237.0 lb

## 2017-05-01 DIAGNOSIS — L97529 Non-pressure chronic ulcer of other part of left foot with unspecified severity: Secondary | ICD-10-CM

## 2017-05-01 DIAGNOSIS — E11621 Type 2 diabetes mellitus with foot ulcer: Secondary | ICD-10-CM

## 2017-05-01 NOTE — Progress Notes (Signed)
Office Visit Note   Patient: [name]           Date of Birth: 1945-08-05           MRN: [id number] Visit Date: 05/01/2017              Requested by: Shirline Frees, MD [place] [place], [place] [address] PCP: Shirline Frees, MD  Chief Complaint  Patient presents with  . Left Foot - Follow-up    3rd toe ulcer      HPI: Patient presents in follow-up for her left foot third toe ulcer.  Patient states that the ulcer has stopped draining at this time he has no redness no cellulitis.  Assessment & Plan: Visit Diagnoses:  1. Diabetic ulcer of toe of left foot associated with type 2 diabetes mellitus, unspecified ulcer stage ([place])     Plan: Discussed that this time we can observe his foot.  There is no signs of active infection at this time.  Follow-up if he develops recurrent ulceration.  Follow-Up Instructions: Return if symptoms worsen or fail to improve.   Ortho Exam  Patient is alert, oriented, no adenopathy, well-dressed, normal affect, normal respiratory effort. Examination patient has a palpable pulse there is no sausage digit swelling of his toes there is no ulcer no cellulitis no tenderness to touch no signs of infection.  Imaging: No results found. No images are attached to the encounter.  Labs: Lab Results  Component Value Date   HGBA1C (H) 08/15/2008    12.2 (NOTE) The ADA recommends the following therapeutic goal for glycemic control related to Hgb A1c measurement: Goal of therapy: <6.5 Hgb A1c  Reference: American Diabetes Association: Clinical Practice Recommendations 2010, Diabetes Care, 2010, 33: (Suppl  1).   HGBA1C (H) 11/27/2007    9.5 (NOTE)   The ADA recommends the following therapeutic goal for glycemic   control related to Hgb A1C measurement:   Goal of Therapy:   < 7.0% Hgb A1C   Reference: American Diabetes Association: Clinical Practice   Recommendations 2008, Diabetes Care,  2008, 31:(Suppl 1).   REPTSTATUS 11/10/2010  FINAL 11/09/2010   CULT NO GROWTH 11/09/2010    @LABSALLVALUES (HGBA1)@  Body mass index is 34.01 kg/m.  Orders:  No orders of the defined types were placed in this encounter.  No orders of the defined types were placed in this encounter.    Procedures: No procedures performed  Clinical Data: No additional findings.  ROS:  All other systems negative, except as noted in the HPI. Review of Systems  Objective: Vital Signs: Ht 5\' 10"  (1.778 m)   Wt 237 lb (107.5 kg)   BMI 34.01 kg/m   Specialty Comments:  No specialty comments available.  PMFS History: Patient Active Problem List   Diagnosis Date Noted  . CKD (chronic kidney disease), stage III ([place])   . Iron deficiency anemia   . Symptomatic anemia 06/29/2016  . Chronic diastolic heart failure ([place]) 01/14/2014  . Stable angina ([place]) 01/14/2014  . Obesity 01/14/2014  . Pulmonary sarcoidosis ([place]) 01/14/2014  . Uncontrolled diabetes mellitus ([place]) 01/14/2014  . Hyperlipidemia 01/14/2014  . Coronary atherosclerosis of native coronary artery   . Sinusitis, chronic 05/30/2011  . Sleep apnea 10/09/2008  . ADRENAL INSUFFICIENCY, HX OF 02/21/2008  . Coronary atherosclerosis 11/20/2007  . Sarcoidosis 02/20/2007  . Diabetes mellitus without complication ([place]) 81/82/9937  . GERD 12/20/2006   Past Medical History:  Diagnosis Date  . Adrenal insufficiency ([place])   .  Allergy   . Anemia   . Arthritis    feet   . CKD (chronic kidney disease), stage III ([place])   . Coronary artery disease    has stents  . Coronary atherosclerosis of native coronary artery    Proximal LAD, posterior lateral stent widely patent-10/12/11  . Diabetes mellitus    insulin and pills  . Hearing loss    wears hearing aids  . Heart attack ([place]) 2010  . History of blood transfusion 06/30/2016   [facility] - 2 units transfused  . Hypertension   . OSA (obstructive sleep apnea)    uses VPAC sleep study 2 years done through [facility]. Dr. Marlou Porch  arranged study  . Pneumonia    3-4 years ago  . Sarcoid   . Sleep apnea    uses CPAP nightly  . Thyroid disease   . Type 2 diabetes mellitus (HCC)     Family History  Problem Relation Age of Onset  . Kidney disease Mother   . Diabetes Mother   . Kidney cancer Mother   . Heart attack Father   . Asthma Sister   . Anesthesia problems Sister        "Kidney's did not wake up"  . Colon cancer Neg Hx   . Colon polyps Neg Hx   . Rectal cancer Neg Hx   . Stomach cancer Neg Hx     Past Surgical History:  Procedure Laterality Date  . APPENDECTOMY    . CATARACT EXTRACTION  2011   bilat  . CHOLECYSTECTOMY  01/25/2011   Procedure: LAPAROSCOPIC CHOLECYSTECTOMY WITH INTRAOPERATIVE CHOLANGIOGRAM;  Surgeon: Judieth Keens, DO;  Location: [facility];  Service: General;  Laterality: N/A;  . COLONOSCOPY     several  . CORONARY ANGIOPLASTY     most recent 11/2009  . CORONARY STENT PLACEMENT  2009   in LAD and side branch PTCA  . INTERCOSTAL NERVE BLOCK  2011, 06/2016   x2. lumbar spine  . LEFT HEART CATHETERIZATION WITH CORONARY ANGIOGRAM Bilateral 10/12/2011   Procedure: LEFT HEART CATHETERIZATION WITH CORONARY ANGIOGRAM;  Surgeon: Candee Furbish, MD;  Location: [facility] CATH LAB;  Service: Cardiovascular;  Laterality: Bilateral;  . LEFT HEART CATHETERIZATION WITH CORONARY ANGIOGRAM N/A 04/01/2014   Procedure: LEFT HEART CATHETERIZATION WITH CORONARY ANGIOGRAM;  Surgeon: Candee Furbish, MD;  Location: [facility] CATH LAB;  Service: Cardiovascular;  Laterality: N/A;   Social History   Occupational History  . 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 Smoking status: Never Smoker  . Smokeless tobacco: Never Used  Substance and Sexual Activity  . Alcohol use: No  . Drug use: No  . Sexual activity: Not on file

## 2017-05-04 ENCOUNTER — Encounter (INDEPENDENT_AMBULATORY_CARE_PROVIDER_SITE_OTHER): Payer: Self-pay | Admitting: Orthopedic Surgery

## 2017-05-04 ENCOUNTER — Ambulatory Visit (INDEPENDENT_AMBULATORY_CARE_PROVIDER_SITE_OTHER): Payer: Medicare HMO | Admitting: Orthopedic Surgery

## 2017-05-04 ENCOUNTER — Encounter (INDEPENDENT_AMBULATORY_CARE_PROVIDER_SITE_OTHER): Payer: Medicare HMO | Admitting: Ophthalmology

## 2017-05-04 ENCOUNTER — Ambulatory Visit (INDEPENDENT_AMBULATORY_CARE_PROVIDER_SITE_OTHER): Payer: Medicare HMO

## 2017-05-04 VITALS — Ht 70.0 in | Wt 237.0 lb

## 2017-05-04 DIAGNOSIS — M79671 Pain in right foot: Secondary | ICD-10-CM

## 2017-05-04 DIAGNOSIS — E11311 Type 2 diabetes mellitus with unspecified diabetic retinopathy with macular edema: Secondary | ICD-10-CM

## 2017-05-04 DIAGNOSIS — E113391 Type 2 diabetes mellitus with moderate nonproliferative diabetic retinopathy without macular edema, right eye: Secondary | ICD-10-CM

## 2017-05-04 DIAGNOSIS — E113512 Type 2 diabetes mellitus with proliferative diabetic retinopathy with macular edema, left eye: Secondary | ICD-10-CM

## 2017-05-04 DIAGNOSIS — M86272 Subacute osteomyelitis, left ankle and foot: Secondary | ICD-10-CM

## 2017-05-04 DIAGNOSIS — I10 Essential (primary) hypertension: Secondary | ICD-10-CM

## 2017-05-04 DIAGNOSIS — L97521 Non-pressure chronic ulcer of other part of left foot limited to breakdown of skin: Secondary | ICD-10-CM | POA: Diagnosis not present

## 2017-05-04 DIAGNOSIS — H35033 Hypertensive retinopathy, bilateral: Secondary | ICD-10-CM | POA: Diagnosis not present

## 2017-05-04 DIAGNOSIS — H43813 Vitreous degeneration, bilateral: Secondary | ICD-10-CM

## 2017-05-04 NOTE — Progress Notes (Signed)
Office Visit Note   Patient: [name]           Date of Birth: 28-Mar-1945           MRN: [id number] Visit Date: 05/04/2017              Requested by: Shirline Frees, MD [place], [place] [address] PCP: Shirline Frees, MD  Chief Complaint  Patient presents with  . Left Foot - Open Wound      HPI: Patient is a 72 year old gentleman who presents with a acute ulcer beneath the left first metatarsal head.  Patient states he noticed the wound yesterday he was wearing a narrow  Shoes.  Assessment & Plan: Visit Diagnoses:  1. Pain in right foot   2. Subacute osteomyelitis, left ankle and foot ([place])   3. Ulcer of left foot, limited to breakdown of skin ([place])     Plan: Patient will wear his 4E with new balance sneakers use a Band-Aid and antibiotic ointment if the ulcer gets deeper bigger or red or he will follow-up immediately.  Follow-Up Instructions: Return if symptoms worsen or fail to improve.   Ortho Exam  Patient is alert, oriented, no adenopathy, well-dressed, normal affect, normal respiratory effort. Examination patient has a good dorsalis pedis pulse.  He has chronic swelling of the second toe but no cellulitis no drainage radiographs shows chronic osteomyelitis of the tuft.  Patient has a new blister over the first metatarsal head left foot.  This is about 10 mm in diameter 0.1 mm deep this is superficial.  Iodosorb and a Band-Aid was applied.  There is no signs of exposed bone tendon or deep soft tissue.  Imaging: Xr Foot 2 Views Left  Result Date: 05/04/2017 Grafts of the left foot shows stable MTP fusion of the great toe.  Patient has peripheral vascular disease with calcification L up to the metatarsal heads.  Patient has chronic osteomyelitis of the second toe tuft.  No images are attached to the encounter.  Labs: Lab Results  Component Value Date   HGBA1C (H) 08/15/2008    12.2 (NOTE) The ADA recommends the following therapeutic  goal for glycemic control related to Hgb A1c measurement: Goal of therapy: <6.5 Hgb A1c  Reference: American Diabetes Association: Clinical Practice Recommendations 2010, Diabetes Care, 2010, 33: (Suppl  1).   HGBA1C (H) 11/27/2007    9.5 (NOTE)   The ADA recommends the following therapeutic goal for glycemic   control related to Hgb A1C measurement:   Goal of Therapy:   < 7.0% Hgb A1C   Reference: American Diabetes Association: Clinical Practice   Recommendations 2008, Diabetes Care,  2008, 31:(Suppl 1).   REPTSTATUS 11/10/2010 FINAL 11/09/2010   CULT NO GROWTH 11/09/2010    @LABSALLVALUES (HGBA1)@  Body mass index is 34.01 kg/m.  Orders:  Orders Placed This Encounter  Procedures  . XR Foot 2 Views Left   No orders of the defined types were placed in this encounter.    Procedures: No procedures performed  Clinical Data: No additional findings.  ROS:  All other systems negative, except as noted in the HPI. Review of Systems  Objective: Vital Signs: Ht 5\' 10"  (1.778 m)   Wt 237 lb (107.5 kg)   BMI 34.01 kg/m   Specialty Comments:  No specialty comments available.  PMFS History: Patient Active Problem List   Diagnosis Date Noted  . CKD (chronic kidney disease), stage III ([place])   . Iron  deficiency anemia   . Symptomatic anemia 06/29/2016  . Chronic diastolic heart failure ([place]) 01/14/2014  . Stable angina ([place]) 01/14/2014  . Obesity 01/14/2014  . Pulmonary sarcoidosis ([place]) 01/14/2014  . Uncontrolled diabetes mellitus ([place]) 01/14/2014  . Hyperlipidemia 01/14/2014  . Coronary atherosclerosis of native coronary artery   . Sinusitis, chronic 05/30/2011  . Sleep apnea 10/09/2008  . ADRENAL INSUFFICIENCY, HX OF 02/21/2008  . Coronary atherosclerosis 11/20/2007  . Sarcoidosis 02/20/2007  . Diabetes mellitus without complication ([place]) 53/61/4431  . GERD 12/20/2006   Past Medical History:  Diagnosis Date  . Adrenal insufficiency ([place])   . Allergy   . Anemia     . Arthritis    feet   . CKD (chronic kidney disease), stage III ([place])   . Coronary artery disease    has stents  . Coronary atherosclerosis of native coronary artery    Proximal LAD, posterior lateral stent widely patent-10/12/11  . Diabetes mellitus    insulin and pills  . Hearing loss    wears hearing aids  . Heart attack ([place]) 2010  . History of blood transfusion 06/30/2016   [facility] - 2 units transfused  . Hypertension   . OSA (obstructive sleep apnea)    uses VPAC sleep study 2 years done through [facility]. Dr. Marlou Porch arranged study  . Pneumonia    3-4 years ago  . Sarcoid   . Sleep apnea    uses CPAP nightly  . Thyroid disease   . Type 2 diabetes mellitus ([facility])     Family History  Problem Relation Age of Onset  . Kidney disease Mother   . Diabetes Mother   . Kidney cancer Mother   . Heart attack Father   . Asthma Sister   . Anesthesia problems Sister        "Kidney's did not wake up"  . Colon cancer Neg Hx   . Colon polyps Neg Hx   . Rectal cancer Neg Hx   . Stomach cancer Neg Hx     Past Surgical History:  Procedure Laterality Date  . APPENDECTOMY    . CATARACT EXTRACTION  2011   bilat  . CHOLECYSTECTOMY  01/25/2011   Procedure: LAPAROSCOPIC CHOLECYSTECTOMY WITH INTRAOPERATIVE CHOLANGIOGRAM;  Surgeon: Judieth Keens, DO;  Location: [facility];  Service: General;  Laterality: N/A;  . COLONOSCOPY     several  . CORONARY ANGIOPLASTY     most recent 11/2009  . CORONARY STENT PLACEMENT  2009   in LAD and side branch PTCA  . INTERCOSTAL NERVE BLOCK  2011, 06/2016   x2. lumbar spine  . LEFT HEART CATHETERIZATION WITH CORONARY ANGIOGRAM Bilateral 10/12/2011   Procedure: LEFT HEART CATHETERIZATION WITH CORONARY ANGIOGRAM;  Surgeon: Candee Furbish, MD;  Location: [facility] C[facility] LAB;  Service: Cardiovascular;  Laterality: Bilateral;  . LEFT HEART CATHETERIZATION WITH CORONARY ANGIOGRAM N/A 04/01/2014   Procedure: LEFT HEART CATHETERIZATION WITH CORONARY ANGIOGRAM;  Surgeon: Candee Furbish, MD;  Location: [facility] CATH LAB;  Service: Cardiovascular;  Laterality: N/A;   Social History   Occupational History  . 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 Smoking status: Never Smoker  . Smokeless tobacco: Never Used  Substance and Sexual Activity  . Alcohol use: No  . Drug use: No  . Sexual activity: Not on file

## 2017-05-29 DIAGNOSIS — J0100 Acute maxillary sinusitis, unspecified: Secondary | ICD-10-CM | POA: Diagnosis not present

## 2017-05-29 DIAGNOSIS — R05 Cough: Secondary | ICD-10-CM | POA: Diagnosis not present

## 2017-05-29 DIAGNOSIS — H9222 Otorrhagia, left ear: Secondary | ICD-10-CM | POA: Diagnosis not present

## 2017-06-15 ENCOUNTER — Encounter (INDEPENDENT_AMBULATORY_CARE_PROVIDER_SITE_OTHER): Payer: Medicare HMO | Admitting: Ophthalmology

## 2017-06-15 DIAGNOSIS — H35033 Hypertensive retinopathy, bilateral: Secondary | ICD-10-CM

## 2017-06-15 DIAGNOSIS — H43813 Vitreous degeneration, bilateral: Secondary | ICD-10-CM | POA: Diagnosis not present

## 2017-06-15 DIAGNOSIS — E113311 Type 2 diabetes mellitus with moderate nonproliferative diabetic retinopathy with macular edema, right eye: Secondary | ICD-10-CM | POA: Diagnosis not present

## 2017-06-15 DIAGNOSIS — I10 Essential (primary) hypertension: Secondary | ICD-10-CM

## 2017-06-15 DIAGNOSIS — E11311 Type 2 diabetes mellitus with unspecified diabetic retinopathy with macular edema: Secondary | ICD-10-CM | POA: Diagnosis not present

## 2017-06-15 DIAGNOSIS — E113512 Type 2 diabetes mellitus with proliferative diabetic retinopathy with macular edema, left eye: Secondary | ICD-10-CM | POA: Diagnosis not present

## 2017-06-22 DIAGNOSIS — E7800 Pure hypercholesterolemia, unspecified: Secondary | ICD-10-CM | POA: Diagnosis not present

## 2017-06-22 DIAGNOSIS — J0101 Acute recurrent maxillary sinusitis: Secondary | ICD-10-CM | POA: Diagnosis not present

## 2017-06-22 DIAGNOSIS — E039 Hypothyroidism, unspecified: Secondary | ICD-10-CM | POA: Diagnosis not present

## 2017-06-22 DIAGNOSIS — I10 Essential (primary) hypertension: Secondary | ICD-10-CM | POA: Diagnosis not present

## 2017-06-22 DIAGNOSIS — E1165 Type 2 diabetes mellitus with hyperglycemia: Secondary | ICD-10-CM | POA: Diagnosis not present

## 2017-06-22 DIAGNOSIS — N183 Chronic kidney disease, stage 3 (moderate): Secondary | ICD-10-CM | POA: Diagnosis not present

## 2017-07-17 DIAGNOSIS — L814 Other melanin hyperpigmentation: Secondary | ICD-10-CM | POA: Diagnosis not present

## 2017-07-17 DIAGNOSIS — L570 Actinic keratosis: Secondary | ICD-10-CM | POA: Diagnosis not present

## 2017-07-17 DIAGNOSIS — L821 Other seborrheic keratosis: Secondary | ICD-10-CM | POA: Diagnosis not present

## 2017-07-17 DIAGNOSIS — Z85828 Personal history of other malignant neoplasm of skin: Secondary | ICD-10-CM | POA: Diagnosis not present

## 2017-07-21 DIAGNOSIS — M791 Myalgia, unspecified site: Secondary | ICD-10-CM | POA: Diagnosis not present

## 2017-07-21 DIAGNOSIS — M47816 Spondylosis without myelopathy or radiculopathy, lumbar region: Secondary | ICD-10-CM | POA: Diagnosis not present

## 2017-08-03 ENCOUNTER — Encounter (INDEPENDENT_AMBULATORY_CARE_PROVIDER_SITE_OTHER): Payer: Medicare HMO | Admitting: Ophthalmology

## 2017-08-03 DIAGNOSIS — E113391 Type 2 diabetes mellitus with moderate nonproliferative diabetic retinopathy without macular edema, right eye: Secondary | ICD-10-CM | POA: Diagnosis not present

## 2017-08-03 DIAGNOSIS — I10 Essential (primary) hypertension: Secondary | ICD-10-CM

## 2017-08-03 DIAGNOSIS — H35033 Hypertensive retinopathy, bilateral: Secondary | ICD-10-CM | POA: Diagnosis not present

## 2017-08-03 DIAGNOSIS — H43813 Vitreous degeneration, bilateral: Secondary | ICD-10-CM

## 2017-08-03 DIAGNOSIS — E11311 Type 2 diabetes mellitus with unspecified diabetic retinopathy with macular edema: Secondary | ICD-10-CM

## 2017-08-03 DIAGNOSIS — E113512 Type 2 diabetes mellitus with proliferative diabetic retinopathy with macular edema, left eye: Secondary | ICD-10-CM

## 2017-08-10 DIAGNOSIS — M47816 Spondylosis without myelopathy or radiculopathy, lumbar region: Secondary | ICD-10-CM | POA: Diagnosis not present

## 2017-08-17 DIAGNOSIS — M545 Low back pain: Secondary | ICD-10-CM | POA: Diagnosis not present

## 2017-08-21 DIAGNOSIS — M5416 Radiculopathy, lumbar region: Secondary | ICD-10-CM | POA: Diagnosis not present

## 2017-09-14 ENCOUNTER — Encounter (INDEPENDENT_AMBULATORY_CARE_PROVIDER_SITE_OTHER): Payer: Medicare HMO | Admitting: Ophthalmology

## 2017-09-21 DIAGNOSIS — M5416 Radiculopathy, lumbar region: Secondary | ICD-10-CM | POA: Diagnosis not present

## 2017-10-03 DIAGNOSIS — E785 Hyperlipidemia, unspecified: Secondary | ICD-10-CM | POA: Diagnosis not present

## 2017-10-03 DIAGNOSIS — E1165 Type 2 diabetes mellitus with hyperglycemia: Secondary | ICD-10-CM | POA: Diagnosis not present

## 2017-10-03 DIAGNOSIS — I700 Atherosclerosis of aorta: Secondary | ICD-10-CM | POA: Diagnosis not present

## 2017-10-03 DIAGNOSIS — N179 Acute kidney failure, unspecified: Secondary | ICD-10-CM | POA: Diagnosis not present

## 2017-10-03 DIAGNOSIS — D509 Iron deficiency anemia, unspecified: Secondary | ICD-10-CM | POA: Diagnosis not present

## 2017-10-03 DIAGNOSIS — E876 Hypokalemia: Secondary | ICD-10-CM | POA: Diagnosis not present

## 2017-10-03 DIAGNOSIS — M79601 Pain in right arm: Secondary | ICD-10-CM | POA: Diagnosis not present

## 2017-10-03 DIAGNOSIS — I5032 Chronic diastolic (congestive) heart failure: Secondary | ICD-10-CM | POA: Diagnosis not present

## 2017-10-03 DIAGNOSIS — N189 Chronic kidney disease, unspecified: Secondary | ICD-10-CM | POA: Diagnosis not present

## 2017-10-04 DIAGNOSIS — M5416 Radiculopathy, lumbar region: Secondary | ICD-10-CM | POA: Diagnosis not present

## 2017-10-06 DIAGNOSIS — L989 Disorder of the skin and subcutaneous tissue, unspecified: Secondary | ICD-10-CM | POA: Diagnosis not present

## 2017-10-06 DIAGNOSIS — Z1389 Encounter for screening for other disorder: Secondary | ICD-10-CM | POA: Diagnosis not present

## 2017-10-12 ENCOUNTER — Encounter (INDEPENDENT_AMBULATORY_CARE_PROVIDER_SITE_OTHER): Payer: Medicare HMO | Admitting: Ophthalmology

## 2017-10-12 DIAGNOSIS — E113391 Type 2 diabetes mellitus with moderate nonproliferative diabetic retinopathy without macular edema, right eye: Secondary | ICD-10-CM | POA: Diagnosis not present

## 2017-10-12 DIAGNOSIS — E11311 Type 2 diabetes mellitus with unspecified diabetic retinopathy with macular edema: Secondary | ICD-10-CM | POA: Diagnosis not present

## 2017-10-12 DIAGNOSIS — H35033 Hypertensive retinopathy, bilateral: Secondary | ICD-10-CM | POA: Diagnosis not present

## 2017-10-12 DIAGNOSIS — E113512 Type 2 diabetes mellitus with proliferative diabetic retinopathy with macular edema, left eye: Secondary | ICD-10-CM

## 2017-10-12 DIAGNOSIS — I10 Essential (primary) hypertension: Secondary | ICD-10-CM | POA: Diagnosis not present

## 2017-10-12 DIAGNOSIS — H43813 Vitreous degeneration, bilateral: Secondary | ICD-10-CM | POA: Diagnosis not present

## 2017-10-24 ENCOUNTER — Encounter: Payer: Self-pay | Admitting: Cardiology

## 2017-10-24 ENCOUNTER — Ambulatory Visit (INDEPENDENT_AMBULATORY_CARE_PROVIDER_SITE_OTHER): Payer: Medicare HMO | Admitting: Cardiology

## 2017-10-24 VITALS — BP 120/60 | HR 78 | Ht 70.0 in | Wt 228.4 lb

## 2017-10-24 DIAGNOSIS — E119 Type 2 diabetes mellitus without complications: Secondary | ICD-10-CM | POA: Diagnosis not present

## 2017-10-24 DIAGNOSIS — D860 Sarcoidosis of lung: Secondary | ICD-10-CM | POA: Diagnosis not present

## 2017-10-24 DIAGNOSIS — I2510 Atherosclerotic heart disease of native coronary artery without angina pectoris: Secondary | ICD-10-CM

## 2017-10-24 DIAGNOSIS — I5032 Chronic diastolic (congestive) heart failure: Secondary | ICD-10-CM | POA: Diagnosis not present

## 2017-10-24 DIAGNOSIS — I25119 Atherosclerotic heart disease of native coronary artery with unspecified angina pectoris: Secondary | ICD-10-CM | POA: Diagnosis not present

## 2017-10-24 DIAGNOSIS — I209 Angina pectoris, unspecified: Secondary | ICD-10-CM | POA: Diagnosis not present

## 2017-10-24 NOTE — Progress Notes (Signed)
[facility]. [address], [address], [place]  [address] Phone: [phone] Fax:  [phone]  Date:  10/24/2017   ID:  [name], [name] [birth year], MRN [id number]  PCP:  Shirline Frees, MD   History of Present Illness: [name] is a 72 y.o. male with coronary artery disease, drug eluting stent to LAD and posterior lateral branch 10/2009, cholecystectomy November 2012, history of pulmonary sarcoid. Cardiac catheterization 10/12/11 that revealed patent LAD, small vessels with normal left ventricular function of 55%, increased left ventricular end-diastolic pressure. Subsequent catheterization was performed on 04/01/14 which resulted in continued medical management. There was moderate diffuse disease in the small caliber distal LAD segment. He has had chronic symptoms of shortness of breath,  decreased endurance which has recently improved with weight loss.   No evidence of increase sarcoidosis on CT scan. Difficult diabetes. Dr. Joya Gaskins on chronic prednisone therapy which seemed to help with lower extremity discomfort.  Wife surgery stroke after neck surgery. Daughter killed in tractor accident.  In the past, not able to tolerate beta blockers or statins.   Chest pain, leg swelling, shortness of breath with activity with lying down. Back pain, dizziness.  He retired in June 2017 and was doing very well, traveling the country. Quite active. He was putting out decorations for the holidays and his back went out. He was laid up in bed for several weeks. Finally had an MRI and had injections and laser ablation of nerves. He is doing better. Unfortunate had a bout of bronchitis. Had some wheezing, shortness of breath. Had gained back about 10 pounds.  10/24/2017- since his last visit he had a GI bleed, colonic polyp.  Overall has been feeling quite well except for his back, sciatic nerve.  This is been troubling him.  He has been getting injections to help with this.  No chest pain fevers chills  nausea vomiting syncope. Wt Readings from Last 3 Encounters:  10/24/17 228 lb 6.4 oz (103.6 kg)  05/04/17 237 lb (107.5 kg)  05/01/17 237 lb (107.5 kg)     Past Medical History:  Diagnosis Date  . Adrenal insufficiency ([place])   . Allergy   . Anemia   . Arthritis    feet   . CKD (chronic kidney disease), stage III ([place])   . Coronary artery disease    has stents  . Coronary atherosclerosis of native coronary artery    Proximal LAD, posterior lateral stent widely patent-10/12/11  . Diabetes mellitus    insulin and pills  . Hearing loss    wears hearing aids  . Heart attack ([place]) 2010  . History of blood transfusion 06/30/2016   [facility] - 2 units transfused  . Hypertension   . OSA (obstructive sleep apnea)    uses VPAC sleep study 2 years done through [facility]. Dr. Marlou Porch arranged study  . Pneumonia    3-4 years ago  . Sarcoid   . Sleep apnea    uses CPAP nightly  . Thyroid disease   . Type 2 diabetes mellitus ([place])     Past Surgical History:  Procedure Laterality Date  . APPENDECTOMY    . CATARACT EXTRACTION  2011   bilat  . CHOLECYSTECTOMY  01/25/2011   Procedure: LAPAROSCOPIC CHOLECYSTECTOMY WITH INTRAOPERATIVE CHOLANGIOGRAM;  Surgeon: Judieth Keens, DO;  Location: [facility];  Service: General;  Laterality: N/A;  . COLONOSCOPY     several  . CORONARY ANGIOPLASTY  most recent 11/2009  . CORONARY STENT PLACEMENT  2009   in LAD and side branch PTCA  . INTERCOSTAL NERVE BLOCK  2011, 06/2016   x2. lumbar spine  . LEFT HEART CATHETERIZATION WITH CORONARY ANGIOGRAM Bilateral 10/12/2011   Procedure: LEFT HEART CATHETERIZATION WITH CORONARY ANGIOGRAM;  Surgeon: Candee Furbish, MD;  Location: [facility] CATH LAB;  Service: Cardiovascular;  Laterality: Bilateral;  . LEFT HEART CATHETERIZATION WITH CORONARY ANGIOGRAM N/A 04/01/2014   Procedure: LEFT HEART CATHETERIZATION WITH CORONARY ANGIOGRAM;  Surgeon: Candee Furbish, MD;  Location: [facility] CATH LAB;  Service: Cardiovascular;  Laterality:  N/A;    Current Outpatient Medications  Medication Sig Dispense Refill  . acetaminophen (TYLENOL) 500 MG tablet Take 500 mg by mouth daily.    Marland Kitchen albuterol (PROVENTIL) (2.5 MG/3ML) 0.083% nebulizer solution Take 3 mLs (2.5 mg total) by nebulization every 6 (six) hours as needed for wheezing or shortness of breath. Dx Code D86.0 360 mL 5  . Albuterol Sulfate (PROAIR RESPICLICK) 578 (90 BASE) MCG/ACT AEPB Inhale 2 puffs into the lungs every 6 (six) hours as needed. 1 each 6  . aspirin EC 81 MG tablet Take 81 mg by mouth daily.    . Cholecalciferol (VITAMIN D3) 3000 UNITS TABS Take 3,000 Units by mouth daily.    . clopidogrel (PLAVIX) 75 MG tablet Take 1 tablet (75 mg total) by mouth daily. 90 tablet 4  . Coenzyme Q10 (COQ10 MAXIMUM STRENGTH PO) Take 1 tablet by mouth daily with lunch.    . ferrous sulfate 325 (65 FE) MG EC tablet Take 1 tablet (325 mg total) by mouth 3 (three) times daily with meals. 90 tablet 0  . fish oil-omega-3 fatty acids 1000 MG capsule Take 3 g by mouth daily with lunch. Omega X - 3    . furosemide (LASIX) 40 MG tablet TAKE 2 TABLETS BY MOUTH EVERY MORNING AND 2 TABLETS EVERY EVENING 360 tablet 0  . glipiZIDE (GLUCOTROL) 10 MG tablet Take 20 mg by mouth 2 (two) times daily.      . insulin aspart protamine-insulin aspart (NOVOLOG 70/30) (70-30) 100 UNIT/ML injection Inject 40-50 Units into the skin 3 (three) times daily as needed (CBG >100). If blood sugar <100 0 units, 100-149 40 units, >150 units 50 units    . levothyroxine (SYNTHROID, LEVOTHROID) 50 MCG tablet Take 1 tablet by mouth daily.    Marland Kitchen losartan (COZAAR) 100 MG tablet Take 100 mg by mouth daily.    . mometasone (NASONEX) 50 MCG/ACT nasal spray Place 2 sprays into the nose daily as needed (congestion). 17 g 6  . potassium chloride (KLOR-CON M10) 10 MEQ tablet Take 2 tablets (20 mEq total) by mouth 2 (two) times daily. 360 tablet 1  . predniSONE (DELTASONE) 10 MG tablet Take 1 tablet (10 mg total) by mouth daily with  breakfast. T 30 tablet 6  . RABEprazole (ACIPHEX) 20 MG tablet Take 1 tablet (20 mg total) by mouth 2 (two) times daily as needed. (Patient taking differently: Take 20 mg by mouth 2 (two) times daily as needed (acid reflux). ) 120 tablet 0  . sulfamethoxazole-trimethoprim (BACTRIM DS,SEPTRA DS) 800-160 MG tablet Take 1 tablet by mouth 2 (two) times daily. 60 tablet 0  . Vitamin D, Ergocalciferol, (DRISDOL) 50000 UNITS CAPS capsule Take 50,000 Units by mouth every 7 (seven) days. On Sundays  1   No current facility-administered medications for this visit.     Allergies:    Allergies  Allergen Reactions  . Hydrocortisone Nausea Only  .  Statins Other (See Comments)    Pt says they make him "mean"    Social History:  The patient  reports that he has never smoked. He has never used smokeless tobacco. He reports that he does not drink alcohol or use drugs.   Family History  Problem Relation Age of Onset  . Kidney disease Mother   . Diabetes Mother   . Kidney cancer Mother   . Heart attack Father   . Asthma Sister   . Anesthesia problems Sister        "Kidney's did not wake up"  . Colon cancer Neg Hx   . Colon polyps Neg Hx   . Rectal cancer Neg Hx   . Stomach cancer Neg Hx     ROS:  See history of present illness. All other review of systems negative.  PHYSICAL EXAM: VS:  BP 120/60   Pulse 78   Ht 5\' 10"  (1.778 m)   Wt 228 lb 6.4 oz (103.6 kg)   BMI 32.77 kg/m  GEN: Well nourished, well developed, in no acute distress  HEENT: normal  Neck: no JVD, carotid bruits, or masses Cardiac: RRR; no murmurs, rubs, or gallops,no edema  Respiratory:  clear to auscultation bilaterally, normal work of breathing GI: soft, nontender, nondistended, + BS MS: no deformity or atrophy  Skin: warm and dry, no rash Neuro:  Alert and Oriented x 3, Strength and sensation are intact Psych: euthymic mood, full affect   EKG:  EKG is ordered today.  10/24/2017-sinus rhythm 78 left anterior  fascicular block personally viewed-prior 06/17/16-sinus rhythm, left axis deviation, no other abnormalities. Artifact in V2. Personally viewed-prior 06/29/15-sinus bradycardia with sinus arrhythmia rate 57, left anterior fascicular block personally viewed-prior 03/03/14-sinus rhythm, LAFB, septal infarct, no ST segment changes, heart rate 84 bpm.  ASSESSMENT AND PLAN:  1. Chronic diastolic heart failure- overall stable.  Euvolemic.   2. Chronic stable angina/dyspnea/coronary artery disease- Prior catheterization repeat as above. Reassurance. He was feeling much better after weight loss, 50 pounds. Some of that weight has come back after his back injury where he was laid in bed for several weeks. Continue to promote exercise.  He has done a good job at losing approximately 10 of those pounds.  We will continue with antiplatelet therapy.  Watch for any signs of further bleeding.  He has been stable. 3. Fluid overload-much improved with salt restriction, weight loss. No changes.  Continue with conservative management. 4. Obesity-good job with overall weight loss. Continue to encourage his weight loss.  Good job. 5. Diabetes-uncontrolled-previously A1c in the 9 range. Now 7.2. Excellent. Continue to work with Dr. Kenton Kingfisher. No changes made, per primary team 6. Pulmonary sarcoid-Dr. Elsworth Soho, former Dr. Joya Gaskins. Tammy Parrett.  Prednisone. Remember, he did have difficulty with leg movement when his prednisone was decreased previously.  7. Hyperlipidemia-hypertriglyceridemia in the setting of uncontrolled diabetes. Continue to monitor.  Last triglycerides 201.  Much better controlled. 8. We will see back in 6 months.  Signed, Candee Furbish, MD [facility]  10/24/2017 4:03 PM    .

## 2017-10-24 NOTE — Patient Instructions (Signed)

## 2017-10-26 DIAGNOSIS — M5416 Radiculopathy, lumbar region: Secondary | ICD-10-CM | POA: Diagnosis not present

## 2017-10-30 DIAGNOSIS — L821 Other seborrheic keratosis: Secondary | ICD-10-CM | POA: Diagnosis not present

## 2017-10-30 DIAGNOSIS — C44622 Squamous cell carcinoma of skin of right upper limb, including shoulder: Secondary | ICD-10-CM | POA: Diagnosis not present

## 2017-10-30 DIAGNOSIS — J0110 Acute frontal sinusitis, unspecified: Secondary | ICD-10-CM | POA: Diagnosis not present

## 2017-10-30 DIAGNOSIS — H9202 Otalgia, left ear: Secondary | ICD-10-CM | POA: Diagnosis not present

## 2017-10-30 DIAGNOSIS — D225 Melanocytic nevi of trunk: Secondary | ICD-10-CM | POA: Diagnosis not present

## 2017-10-30 DIAGNOSIS — L820 Inflamed seborrheic keratosis: Secondary | ICD-10-CM | POA: Diagnosis not present

## 2017-11-27 DIAGNOSIS — Z85828 Personal history of other malignant neoplasm of skin: Secondary | ICD-10-CM | POA: Diagnosis not present

## 2017-11-27 DIAGNOSIS — Z08 Encounter for follow-up examination after completed treatment for malignant neoplasm: Secondary | ICD-10-CM | POA: Diagnosis not present

## 2017-11-28 DIAGNOSIS — D869 Sarcoidosis, unspecified: Secondary | ICD-10-CM | POA: Diagnosis not present

## 2017-11-28 DIAGNOSIS — J209 Acute bronchitis, unspecified: Secondary | ICD-10-CM | POA: Diagnosis not present

## 2017-12-12 DIAGNOSIS — S61219A Laceration without foreign body of unspecified finger without damage to nail, initial encounter: Secondary | ICD-10-CM | POA: Diagnosis not present

## 2017-12-21 ENCOUNTER — Encounter (INDEPENDENT_AMBULATORY_CARE_PROVIDER_SITE_OTHER): Payer: Medicare HMO | Admitting: Ophthalmology

## 2017-12-22 DIAGNOSIS — D860 Sarcoidosis of lung: Secondary | ICD-10-CM | POA: Diagnosis not present

## 2017-12-22 DIAGNOSIS — I10 Essential (primary) hypertension: Secondary | ICD-10-CM | POA: Diagnosis not present

## 2017-12-22 DIAGNOSIS — I2510 Atherosclerotic heart disease of native coronary artery without angina pectoris: Secondary | ICD-10-CM | POA: Diagnosis not present

## 2017-12-22 DIAGNOSIS — E1122 Type 2 diabetes mellitus with diabetic chronic kidney disease: Secondary | ICD-10-CM | POA: Diagnosis not present

## 2017-12-22 DIAGNOSIS — J40 Bronchitis, not specified as acute or chronic: Secondary | ICD-10-CM | POA: Diagnosis not present

## 2017-12-22 DIAGNOSIS — E7800 Pure hypercholesterolemia, unspecified: Secondary | ICD-10-CM | POA: Diagnosis not present

## 2017-12-22 DIAGNOSIS — N183 Chronic kidney disease, stage 3 (moderate): Secondary | ICD-10-CM | POA: Diagnosis not present

## 2017-12-22 DIAGNOSIS — Z23 Encounter for immunization: Secondary | ICD-10-CM | POA: Diagnosis not present

## 2017-12-22 DIAGNOSIS — Z794 Long term (current) use of insulin: Secondary | ICD-10-CM | POA: Diagnosis not present

## 2017-12-22 DIAGNOSIS — E1165 Type 2 diabetes mellitus with hyperglycemia: Secondary | ICD-10-CM | POA: Diagnosis not present

## 2017-12-22 DIAGNOSIS — Z789 Other specified health status: Secondary | ICD-10-CM | POA: Diagnosis not present

## 2017-12-22 DIAGNOSIS — E1142 Type 2 diabetes mellitus with diabetic polyneuropathy: Secondary | ICD-10-CM | POA: Diagnosis not present

## 2017-12-22 DIAGNOSIS — E039 Hypothyroidism, unspecified: Secondary | ICD-10-CM | POA: Diagnosis not present

## 2018-02-12 ENCOUNTER — Telehealth: Payer: Self-pay | Admitting: Cardiology

## 2018-02-12 DIAGNOSIS — Z79899 Other long term (current) drug therapy: Secondary | ICD-10-CM

## 2018-02-12 DIAGNOSIS — I5032 Chronic diastolic (congestive) heart failure: Secondary | ICD-10-CM

## 2018-02-12 NOTE — Telephone Encounter (Signed)
Spoke with the patient, he has been feeling more SOB symptoms during walking, in the past 2 weeks. He has had a lot of stress the past 2 weeks. He denies any other changes and has been taking his medications. He can walk about "the distance of a grocery store front to back", then he needs to sit and rest. He denies any other symptoms during the SOB. Advised if symptoms worsen or change to contact our office. Sending to Dr. Marlou Porch for recommendations.

## 2018-02-12 NOTE — Telephone Encounter (Signed)
Pt c/o Shortness Of Breath: STAT if SOB developed within the last 24 hours or pt is noticeably SOB on the phone  1. Are you currently SOB (can you hear that pt is SOB on the phone)? No- he wants   2. How long have you been experiencing SOB? About 2 weeks- a lot of stress  3. Are you SOB when sitting or when up moving around? Most when he walks  4. Are you currently experiencing any other symptoms? no

## 2018-02-13 NOTE — Telephone Encounter (Signed)
Have him take 120mg  of lasix PO BID. Was on 80mg  PO BID.  Check BMET in three days.  Candee Furbish, MD

## 2018-02-13 NOTE — Telephone Encounter (Signed)
Pt aware to increase Furosemide to 120 mg BID and have BMP in 3 days after the increase.  Pt will follow these orders and will have blood work on Monday.

## 2018-02-13 NOTE — Telephone Encounter (Signed)
Left message to cb to discuss medication change and need for lab work.

## 2018-02-19 ENCOUNTER — Other Ambulatory Visit: Payer: Medicare HMO | Admitting: *Deleted

## 2018-02-19 DIAGNOSIS — Z79899 Other long term (current) drug therapy: Secondary | ICD-10-CM | POA: Diagnosis not present

## 2018-02-19 DIAGNOSIS — B078 Other viral warts: Secondary | ICD-10-CM | POA: Diagnosis not present

## 2018-02-19 DIAGNOSIS — Z85828 Personal history of other malignant neoplasm of skin: Secondary | ICD-10-CM | POA: Diagnosis not present

## 2018-02-19 DIAGNOSIS — L570 Actinic keratosis: Secondary | ICD-10-CM | POA: Diagnosis not present

## 2018-02-19 DIAGNOSIS — Z08 Encounter for follow-up examination after completed treatment for malignant neoplasm: Secondary | ICD-10-CM | POA: Diagnosis not present

## 2018-02-19 DIAGNOSIS — I5032 Chronic diastolic (congestive) heart failure: Secondary | ICD-10-CM

## 2018-02-19 DIAGNOSIS — X32XXXD Exposure to sunlight, subsequent encounter: Secondary | ICD-10-CM | POA: Diagnosis not present

## 2018-02-20 DIAGNOSIS — M5416 Radiculopathy, lumbar region: Secondary | ICD-10-CM | POA: Diagnosis not present

## 2018-02-20 LAB — BASIC METABOLIC PANEL
BUN / CREAT RATIO: 18 (ref 10–24)
BUN: 28 mg/dL — AB (ref 8–27)
CHLORIDE: 100 mmol/L (ref 96–106)
CO2: 27 mmol/L (ref 20–29)
Calcium: 9.6 mg/dL (ref 8.6–10.2)
Creatinine, Ser: 1.56 mg/dL — ABNORMAL HIGH (ref 0.76–1.27)
GFR calc non Af Amer: 44 mL/min/{1.73_m2} — ABNORMAL LOW (ref >59)
GFR, EST AFRICAN AMERICAN: 51 mL/min/{1.73_m2} — AB (ref >59)
Glucose: 81 mg/dL (ref 65–99)
POTASSIUM: 3.7 mmol/L (ref 3.5–5.2)
Sodium: 143 mmol/L (ref 134–144)

## 2018-02-22 ENCOUNTER — Encounter (INDEPENDENT_AMBULATORY_CARE_PROVIDER_SITE_OTHER): Payer: Medicare HMO | Admitting: Ophthalmology

## 2018-02-22 DIAGNOSIS — I10 Essential (primary) hypertension: Secondary | ICD-10-CM

## 2018-02-22 DIAGNOSIS — E11311 Type 2 diabetes mellitus with unspecified diabetic retinopathy with macular edema: Secondary | ICD-10-CM | POA: Diagnosis not present

## 2018-02-22 DIAGNOSIS — H43813 Vitreous degeneration, bilateral: Secondary | ICD-10-CM

## 2018-02-22 DIAGNOSIS — H35033 Hypertensive retinopathy, bilateral: Secondary | ICD-10-CM

## 2018-02-22 DIAGNOSIS — E113391 Type 2 diabetes mellitus with moderate nonproliferative diabetic retinopathy without macular edema, right eye: Secondary | ICD-10-CM

## 2018-02-22 DIAGNOSIS — E113512 Type 2 diabetes mellitus with proliferative diabetic retinopathy with macular edema, left eye: Secondary | ICD-10-CM

## 2018-04-09 DIAGNOSIS — E7800 Pure hypercholesterolemia, unspecified: Secondary | ICD-10-CM | POA: Diagnosis not present

## 2018-04-09 DIAGNOSIS — E1142 Type 2 diabetes mellitus with diabetic polyneuropathy: Secondary | ICD-10-CM | POA: Diagnosis not present

## 2018-04-09 DIAGNOSIS — E113299 Type 2 diabetes mellitus with mild nonproliferative diabetic retinopathy without macular edema, unspecified eye: Secondary | ICD-10-CM | POA: Diagnosis not present

## 2018-04-09 DIAGNOSIS — K219 Gastro-esophageal reflux disease without esophagitis: Secondary | ICD-10-CM | POA: Diagnosis not present

## 2018-04-09 DIAGNOSIS — N183 Chronic kidney disease, stage 3 (moderate): Secondary | ICD-10-CM | POA: Diagnosis not present

## 2018-04-09 DIAGNOSIS — E1122 Type 2 diabetes mellitus with diabetic chronic kidney disease: Secondary | ICD-10-CM | POA: Diagnosis not present

## 2018-04-09 DIAGNOSIS — I10 Essential (primary) hypertension: Secondary | ICD-10-CM | POA: Diagnosis not present

## 2018-04-09 DIAGNOSIS — J0111 Acute recurrent frontal sinusitis: Secondary | ICD-10-CM | POA: Diagnosis not present

## 2018-04-09 DIAGNOSIS — Z794 Long term (current) use of insulin: Secondary | ICD-10-CM | POA: Diagnosis not present

## 2018-04-09 DIAGNOSIS — R1031 Right lower quadrant pain: Secondary | ICD-10-CM | POA: Diagnosis not present

## 2018-04-09 DIAGNOSIS — T466X5A Adverse effect of antihyperlipidemic and antiarteriosclerotic drugs, initial encounter: Secondary | ICD-10-CM | POA: Diagnosis not present

## 2018-04-09 DIAGNOSIS — E1165 Type 2 diabetes mellitus with hyperglycemia: Secondary | ICD-10-CM | POA: Diagnosis not present

## 2018-04-10 ENCOUNTER — Other Ambulatory Visit: Payer: Self-pay | Admitting: Family Medicine

## 2018-04-10 DIAGNOSIS — R1031 Right lower quadrant pain: Secondary | ICD-10-CM

## 2018-04-17 ENCOUNTER — Other Ambulatory Visit: Payer: Self-pay | Admitting: Family Medicine

## 2018-04-17 DIAGNOSIS — R1031 Right lower quadrant pain: Secondary | ICD-10-CM

## 2018-04-19 ENCOUNTER — Encounter (INDEPENDENT_AMBULATORY_CARE_PROVIDER_SITE_OTHER): Payer: Medicare HMO | Admitting: Ophthalmology

## 2018-04-19 DIAGNOSIS — E113311 Type 2 diabetes mellitus with moderate nonproliferative diabetic retinopathy with macular edema, right eye: Secondary | ICD-10-CM

## 2018-04-19 DIAGNOSIS — H35033 Hypertensive retinopathy, bilateral: Secondary | ICD-10-CM

## 2018-04-19 DIAGNOSIS — E113512 Type 2 diabetes mellitus with proliferative diabetic retinopathy with macular edema, left eye: Secondary | ICD-10-CM

## 2018-04-19 DIAGNOSIS — E11311 Type 2 diabetes mellitus with unspecified diabetic retinopathy with macular edema: Secondary | ICD-10-CM

## 2018-04-19 DIAGNOSIS — I10 Essential (primary) hypertension: Secondary | ICD-10-CM | POA: Diagnosis not present

## 2018-04-19 DIAGNOSIS — H43813 Vitreous degeneration, bilateral: Secondary | ICD-10-CM

## 2018-04-20 ENCOUNTER — Ambulatory Visit
Admission: RE | Admit: 2018-04-20 | Discharge: 2018-04-20 | Disposition: A | Discharge status: Home / Self Care | Payer: Medicare HMO | Source: Ambulatory Visit | Attending: Family Medicine | Admitting: Family Medicine

## 2018-04-20 DIAGNOSIS — K4090 Unilateral inguinal hernia, without obstruction or gangrene, not specified as recurrent: Secondary | ICD-10-CM | POA: Diagnosis not present

## 2018-04-20 DIAGNOSIS — R1031 Right lower quadrant pain: Secondary | ICD-10-CM

## 2018-04-20 IMAGING — CT CT ABD-PELV W/ CM
2 of 5 series · 16 of 46 positions shown, 18 images · IV contrast (iopamidol)
Comparison: CT scan [DATE]

CLINICAL DATA: Right lower quadrant pain since [DATE].
Previous cholecystectomy and appendectomy.

EXAM:
CT ABDOMEN AND PELVIS WITH CONTRAST
TECHNIQUE: Multidetector CT imaging of the abdomen and pelvis was performed
using the standard protocol following bolus administration of
intravenous contrast.
CONTRAST:  100mL [JF] IOPAMIDOL ([JF]) INJECTION 61%

[Series 2: abd pelvis 5.00 br40 s3 ax · axial · 0.89mm/px · z∈[+1221,+1701]mm · 13 of 108 slices shown, 15 images]
[im 6/108  soft-tissue]
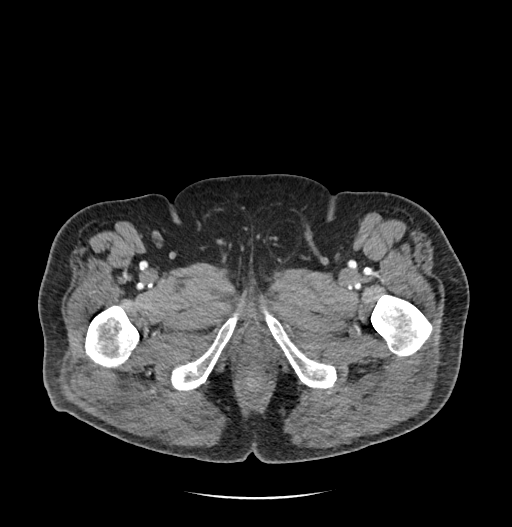
[im 6/108  bone]
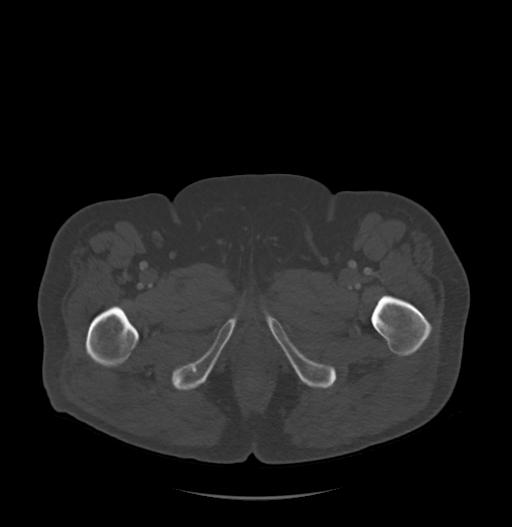
[im 12/108  soft-tissue]
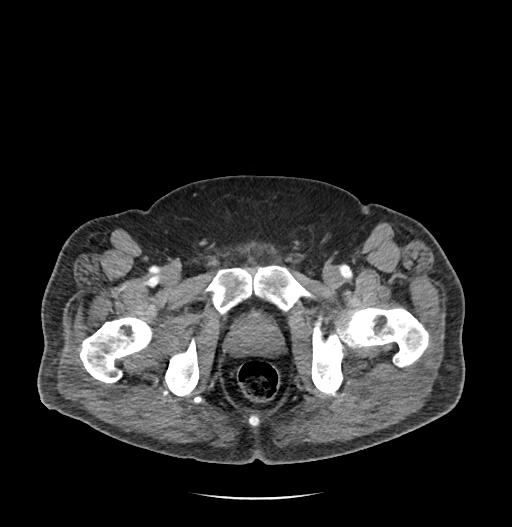
[im 24/108  soft-tissue]
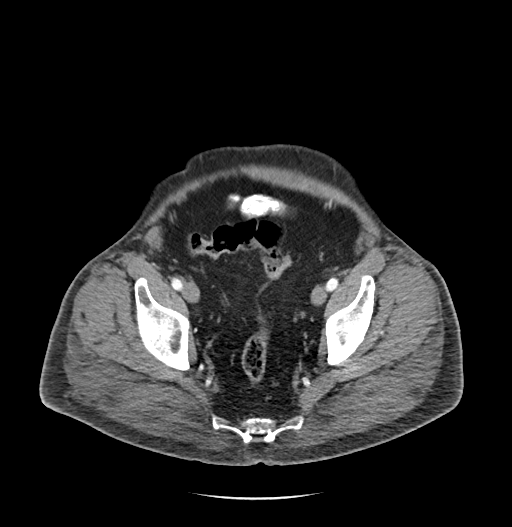
[im 30/108  soft-tissue]
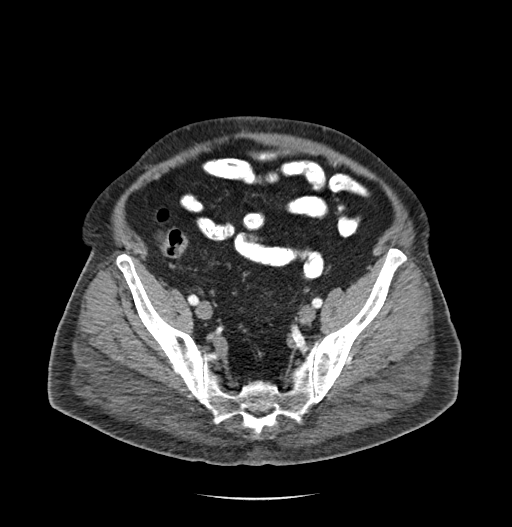
[im 36/108  soft-tissue]
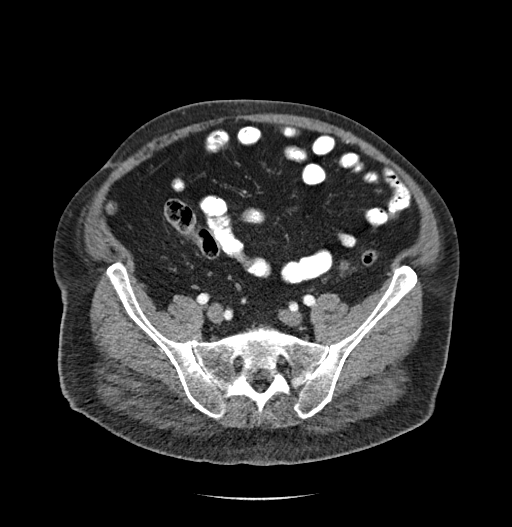
[im 48/108  soft-tissue]
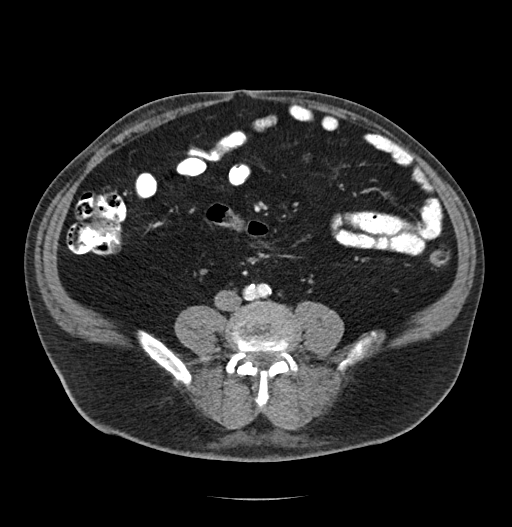
[im 54/108  soft-tissue]
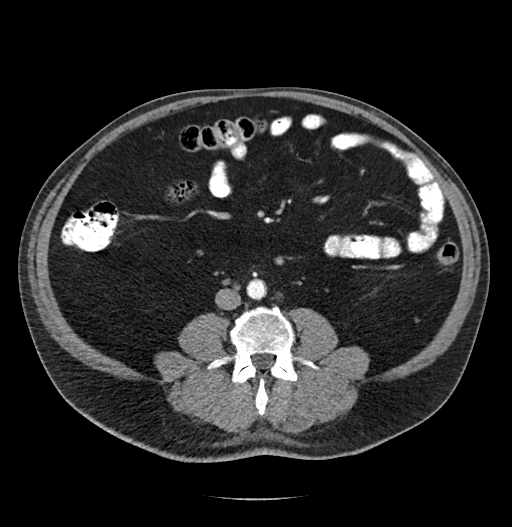
[im 60/108  soft-tissue]
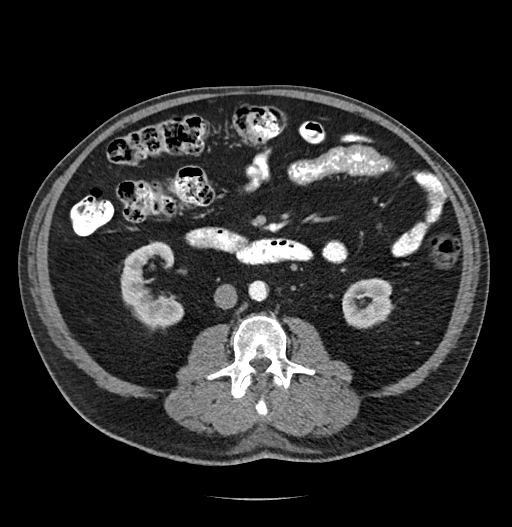
[im 72/108  soft-tissue]
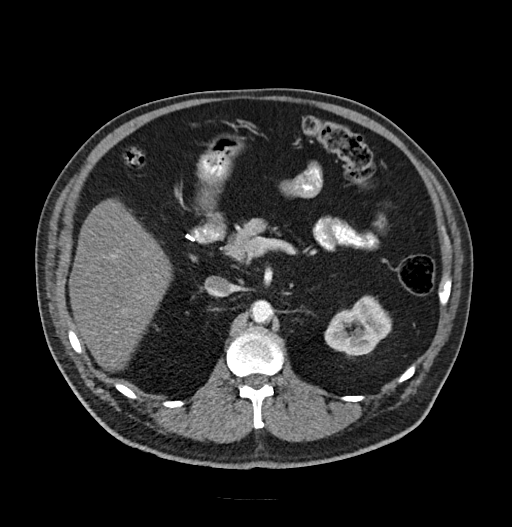
[im 72/108  bone]
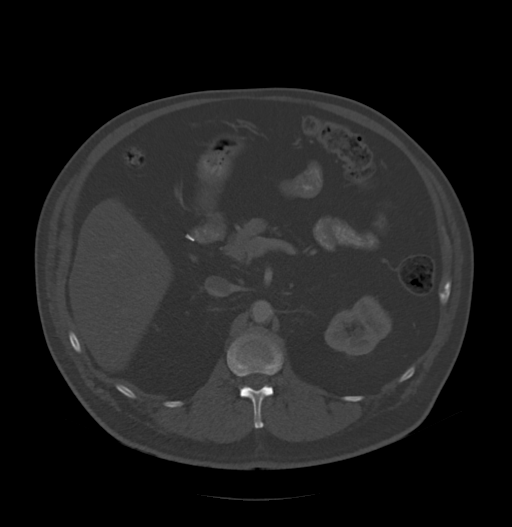
[im 78/108  soft-tissue]
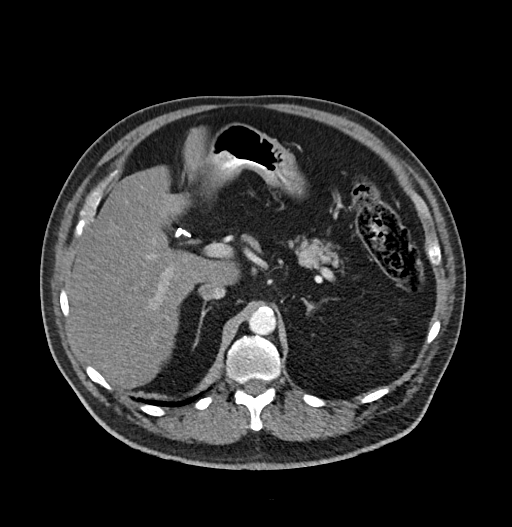
[im 84/108  soft-tissue]
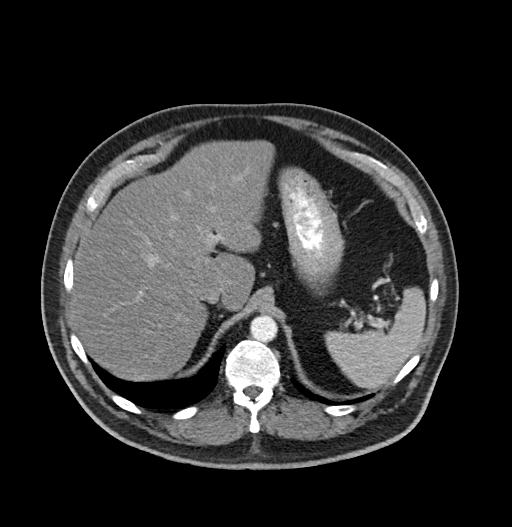
[im 96/108  soft-tissue]
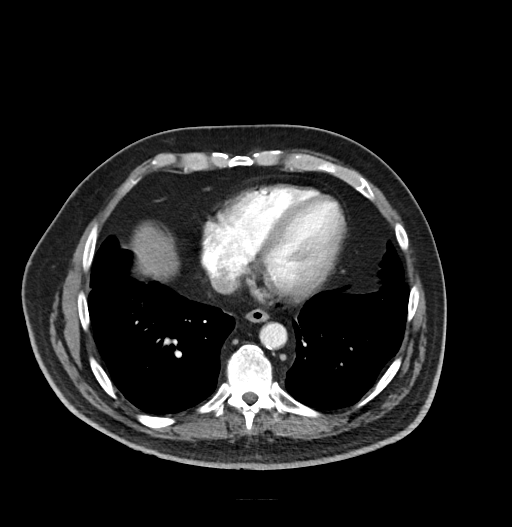
[im 102/108  soft-tissue]
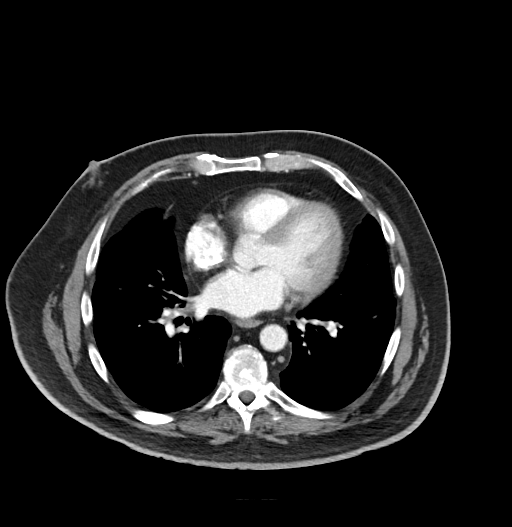

[Series 6: abd pelvis 2.00 br40 s3 cor · coronal · 0.85mm/px · 3 of 205 slices shown]
[im 69/205  soft-tissue]
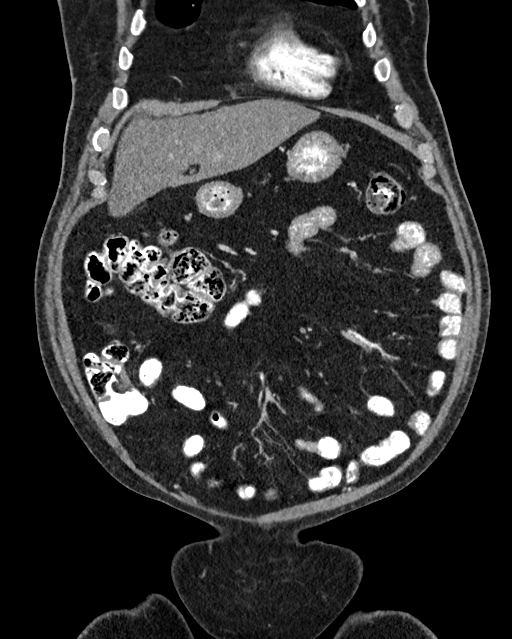
[im 91/205  soft-tissue]
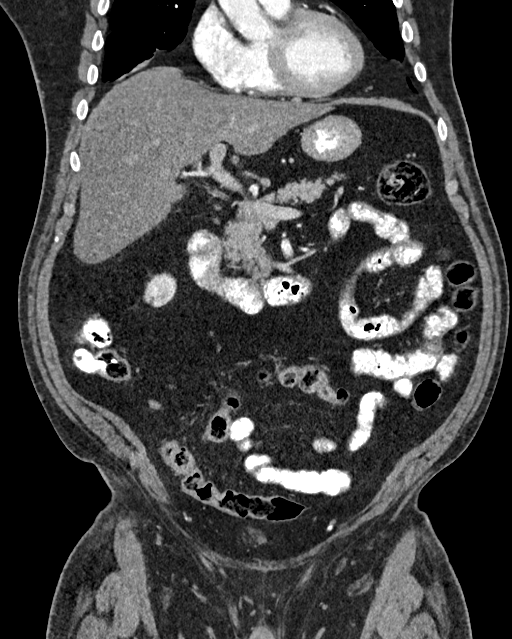
[im 114/205  soft-tissue]
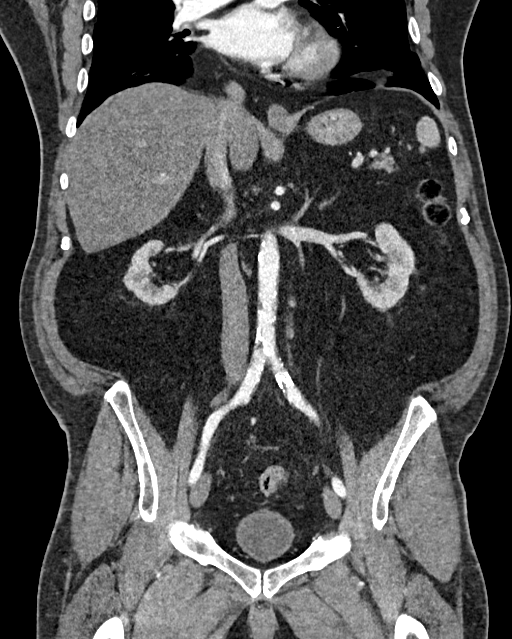

[16 of 46 positions shown; findings below may reference images not displayed]

FINDINGS: Lower chest: There is a 5 mm nodule in the anterior right lung on
series 4, image 1. There is scarring or atelectasis in the right
base. There is mild atelectasis in the left base. Coronary artery
stent is noted. Mild gynecomastia. No other abnormalities in the
lower chest.

Hepatobiliary: Hepatic steatosis. Portal vein is patent.
Cholecystectomy.

Pancreas: Unremarkable. No pancreatic ductal dilatation or
surrounding inflammatory changes.

Spleen: Normal in size without focal abnormality.

Adrenals/Urinary Tract: Adrenal glands are normal. Small exophytic
cyst off the upper right kidney on series 2, image 42. No suspicious
renal masses. No hydronephrosis or perinephric stranding. The
ureters are normal in caliber with no ureteral stones. The bladder
is unremarkable.

Stomach/Bowel: The stomach and small bowel are normal. The colon is
normal. The patient is status post appendectomy.

Vascular/Lymphatic: Atherosclerotic changes are seen in the
nonaneurysmal aorta. No adenopathy.

Reproductive: Prostate is unremarkable.

Other: Fat containing left inguinal hernia. No other acute
abnormalities. No free air or free fluid.

Musculoskeletal: No acute or significant osseous findings.
IMPRESSION: 1. No acute abnormality to explain the patient's symptoms.
2. 5 mm nodule in the anterior right lung. No follow-up needed if
patient is low-risk. Non-contrast chest CT can be considered in 12
months if patient is high-risk. This recommendation follows the
consensus statement: Guidelines for Management of Incidental
Pulmonary Nodules Detected on CT Images: From the [HOSPITAL]
3. Hepatic steatosis.
4. Atherosclerotic changes in the nonaneurysmal aorta.
5. Fat containing left inguinal hernia.

## 2018-04-20 MED ORDER — IOPAMIDOL (ISOVUE-300) INJECTION 61%
100.0000 mL | Freq: Once | INTRAVENOUS | Status: AC | PRN
  Administered 2018-04-20: 100 mL via INTRAVENOUS

## 2018-05-02 DIAGNOSIS — E11319 Type 2 diabetes mellitus with unspecified diabetic retinopathy without macular edema: Secondary | ICD-10-CM | POA: Diagnosis not present

## 2018-05-02 DIAGNOSIS — I10 Essential (primary) hypertension: Secondary | ICD-10-CM | POA: Diagnosis not present

## 2018-05-02 DIAGNOSIS — I2510 Atherosclerotic heart disease of native coronary artery without angina pectoris: Secondary | ICD-10-CM | POA: Diagnosis not present

## 2018-05-02 DIAGNOSIS — D508 Other iron deficiency anemias: Secondary | ICD-10-CM | POA: Diagnosis not present

## 2018-05-02 DIAGNOSIS — E1165 Type 2 diabetes mellitus with hyperglycemia: Secondary | ICD-10-CM | POA: Diagnosis not present

## 2018-05-02 DIAGNOSIS — E11621 Type 2 diabetes mellitus with foot ulcer: Secondary | ICD-10-CM | POA: Diagnosis not present

## 2018-05-02 DIAGNOSIS — E785 Hyperlipidemia, unspecified: Secondary | ICD-10-CM | POA: Diagnosis not present

## 2018-05-02 DIAGNOSIS — E039 Hypothyroidism, unspecified: Secondary | ICD-10-CM | POA: Diagnosis not present

## 2018-05-02 DIAGNOSIS — E1142 Type 2 diabetes mellitus with diabetic polyneuropathy: Secondary | ICD-10-CM | POA: Diagnosis not present

## 2018-05-31 ENCOUNTER — Encounter (INDEPENDENT_AMBULATORY_CARE_PROVIDER_SITE_OTHER): Payer: Medicare HMO | Admitting: Ophthalmology

## 2018-06-07 DIAGNOSIS — J019 Acute sinusitis, unspecified: Secondary | ICD-10-CM | POA: Diagnosis not present

## 2018-06-22 ENCOUNTER — Telehealth: Payer: Self-pay

## 2018-06-22 NOTE — Telephone Encounter (Signed)
Virtual Visit Pre-Appointment Phone Call  Steps For Call:  1. Confirm consent - "In the setting of the current Covid19 crisis, you are scheduled for a (phone or video) visit with your provider on (date) at (time).  Just as we do with many in-office visits, in order for you to participate in this visit, we must obtain consent.  If you'd like, I can send this to your mychart (if signed up) or email for you to review.  Otherwise, I can obtain your verbal consent now.  All virtual visits are billed to your insurance company just like a normal visit would be.  By agreeing to a virtual visit, we'd like you to understand that the technology does not allow for your provider to perform an examination, and thus may limit your provider's ability to fully assess your condition. If your provider identifies any concerns that need to be evaluated in person, we will make arrangements to do so.  Finally, though the technology is pretty good, we cannot assure that it will always work on either your or our end, and in the setting of a video visit, we may have to convert it to a phone-only visit.  In either situation, we cannot ensure that we have a secure connection.  Are you willing to proceed?" STAFF: Did the patient verbally acknowledge consent to telehealth visit? YES  2. Confirm the BEST phone number to call the day of the visit by including in appointment notes  3. Give patient instructions for WebEx/MyChart download to smartphone as below or Doximity/Doxy.me if video visit (depending on what platform provider is using)  4. Advise patient to be prepared with their blood pressure, heart rate, weight, any heart rhythm information, their current medicines, and a piece of paper and pen handy for any instructions they may receive the day of their visit  5. Inform patient they will receive a phone call 15 minutes prior to their appointment time (may be from unknown caller ID) so they should be prepared to answer   6. Confirm that appointment type is correct in Epic appointment notes (VIDEO vs PHONE)     TELEPHONE CALL NOTE  [name] has been deemed a candidate for a follow-up tele-health visit to limit community exposure during the Covid-19 pandemic. I spoke with the patient via phone to ensure availability of phone/video source, confirm preferred email & phone number, and discuss instructions and expectations.  I reminded [name] to be prepared with any vital sign and/or heart rhythm information that could potentially be obtained via home monitoring, at the time of his visit. I reminded [name] to expect a phone call at the time of his visit if his visit.  [name], [place] 06/22/2018 5:39 PM   INSTRUCTIONS FOR DOWNLOADING THE [place] APP TO SMARTPHONE  - If Apple, ask patient to go to [employer] and type in WebEx in the search bar. [employer], the blue/green circle. If Android, go to [employer] and type in [employer] in the search bar. The app is free but as with any other app downloads, their phone may require them to verify saved payment information or Apple/Android password.  - The patient does NOT have to create an account. - On the day of the visit, the assist will walk the patient through joining the meeting with the meeting number/password.  INSTRUCTIONS FOR DOWNLOADING THE MYCHART APP TO SMARTPHONE  - The patient must first make sure  to have activated MyChart and know their login information - If Apple, go to [employer] and type in MyChart in the search bar and download the app. If Android, ask patient to go to [employer] and type in Clifford in the search bar and download the app. The app is free but as with any other app downloads, their phone may require them to verify saved payment information or Apple/Android password.  - The patient will need to then log into the app with their MyChart username and password, and select [facility] as their  healthcare provider to link the account. When it is time for your visit, go to the MyChart app, find appointments, and click Begin Video Visit. Be sure to Select Allow for your device to access the Microphone and Camera for your visit. You will then be connected, and your provider will be with you shortly.  **If they have any issues connecting, or need assistance please contact MyChart service desk [phone])**  **If using a computer, in order to ensure the best quality for their visit they will need to use either of the following Internet Browsers: [employer], or Google Chrome**  IF USING DOXIMITY or DOXY.ME - The patient will receive a link just prior to their visit, either by text or email (to be determined day of appointment depending on if it's doxy.me or Doximity).     FULL LENGTH CONSENT FOR TELE-HEALTH VISIT   I hereby voluntarily request, consent and authorize [facility] and its employed or contracted physicians, physician assistants, nurse practitioners or other licensed health care professionals (the Practitioner), to provide me with telemedicine health care services (the "Services") as deemed necessary by the treating Practitioner. I acknowledge and consent to receive the Services by the Practitioner via telemedicine. I understand that the telemedicine visit will involve communicating with the Practitioner through live audiovisual communication technology and the disclosure of certain medical information by electronic transmission. I acknowledge that I have been given the opportunity to request an in-person assessment or other available alternative prior to the telemedicine visit and am voluntarily participating in the telemedicine visit.  I understand that I have the right to withhold or withdraw my consent to the use of telemedicine in the course of my care at any time, without affecting my right to future care or treatment, and that the Practitioner or I may  terminate the telemedicine visit at any time. I understand that I have the right to inspect all information obtained and/or recorded in the course of the telemedicine visit and may receive copies of available information for a reasonable fee.  I understand that some of the potential risks of receiving the Services via telemedicine include:  Marland Kitchen Delay or interruption in medical evaluation due to technological equipment failure or disruption; . Information transmitted may not be sufficient (e.g. poor resolution of images) to allow for appropriate medical decision making by the Practitioner; and/or  . In rare instances, security protocols could fail, causing a breach of personal health information.  Furthermore, I acknowledge that it is my responsibility to provide information about my medical history, conditions and care that is complete and accurate to the best of my ability. I acknowledge that Practitioner's advice, recommendations, and/or decision may be based on factors not within their control, such as incomplete or inaccurate data provided by me or distortions of diagnostic images or specimens that may result from electronic transmissions. I understand that the practice of medicine is not an exact science  and that Practitioner makes no warranties or guarantees regarding treatment outcomes. I acknowledge that I will receive a copy of this consent concurrently upon execution via email to the email address I last provided but may also request a printed copy by calling the office of [facility].    I understand that my insurance will be billed for this visit.   I have read or had this consent read to me. . I understand the contents of this consent, which adequately explains the benefits and risks of the Services being provided via telemedicine.  . I have been provided ample opportunity to ask questions regarding this consent and the Services and have had my questions answered to my satisfaction. . I  give my informed consent for the services to be provided through the use of telemedicine in my medical care  By participating in this telemedicine visit I agree to the above.

## 2018-06-28 ENCOUNTER — Other Ambulatory Visit: Payer: Self-pay

## 2018-06-28 ENCOUNTER — Telehealth (INDEPENDENT_AMBULATORY_CARE_PROVIDER_SITE_OTHER): Payer: Medicare HMO | Admitting: Cardiology

## 2018-06-28 ENCOUNTER — Encounter: Payer: Self-pay | Admitting: Cardiology

## 2018-06-28 VITALS — BP 136/92 | Ht 70.0 in | Wt 234.0 lb

## 2018-06-28 DIAGNOSIS — Z79899 Other long term (current) drug therapy: Secondary | ICD-10-CM

## 2018-06-28 DIAGNOSIS — R0602 Shortness of breath: Secondary | ICD-10-CM

## 2018-06-28 DIAGNOSIS — I5032 Chronic diastolic (congestive) heart failure: Secondary | ICD-10-CM | POA: Diagnosis not present

## 2018-06-28 DIAGNOSIS — I5033 Acute on chronic diastolic (congestive) heart failure: Secondary | ICD-10-CM | POA: Diagnosis not present

## 2018-06-28 DIAGNOSIS — I2510 Atherosclerotic heart disease of native coronary artery without angina pectoris: Secondary | ICD-10-CM

## 2018-06-28 MED ORDER — METOLAZONE 5 MG PO TABS: 5.0000 mg | ORAL_TABLET | ORAL | 1 refills | Status: DC

## 2018-06-28 NOTE — Progress Notes (Signed)
Virtual Visit via Video Note   This visit type was conducted due to national recommendations for restrictions regarding the COVID-19 Pandemic (e.g. social distancing) in an effort to limit this patient's exposure and mitigate transmission in our community.  Due to his co-morbid illnesses, this patient is at least at moderate risk for complications without adequate follow up.  This format is felt to be most appropriate for this patient at this time.  All issues noted in this document were discussed and addressed.  A limited physical exam was performed with this format.  Please refer to the patient's chart for his consent to telehealth for [facility]   Evaluation Performed:  Follow-up visit  Date:  06/28/2018   ID:  [name], [name] [birth year], MRN [id number]  Patient Location: Home Provider Location: Home  PCP:  Shirline Frees, MD  Cardiologist:  Candee Furbish, MD  Electrophysiologist:  None   Chief Complaint: More shortness of breath, more ankle edema  History of Present Illness:    JOURDAIN GUAY is a 73 y.o. male with coronary artery disease, mixed hyperlipidemia follow-up.  Prior back pain.  Had prior GI bleed colonic polyp.  240 of lasix. 80 TID. Ankles still swell quickly.  Quite severe. SOB cough dry sinus Levaquin, back.  Please see below for details.  He has not been back to pulmonary.   The patient does not have symptoms concerning for COVID-19 infection (fever, chills, cough, or new shortness of breath).    Past Medical History:  Diagnosis Date  . Adrenal insufficiency ([place])   . Allergy   . Anemia   . Arthritis    feet   . CKD (chronic kidney disease), stage III ([place])   . Coronary artery disease    has stents  . Coronary atherosclerosis of native coronary artery    Proximal LAD, posterior lateral stent widely patent-10/12/11  . Diabetes mellitus    insulin and pills  . Hearing loss    wears hearing aids  . Heart attack ([place]) 2010  . History of blood  transfusion 06/30/2016   [facility] - 2 units transfused  . Hypertension   . OSA (obstructive sleep apnea)    uses VPAC sleep study 2 years done through [facility]. Dr. Marlou Porch arranged study  . Pneumonia    3-4 years ago  . Sarcoid   . Sleep apnea    uses CPAP nightly  . Thyroid disease   . Type 2 diabetes mellitus ([place])    Past Surgical History:  Procedure Laterality Date  . APPENDECTOMY    . CATARACT EXTRACTION  2011   bilat  . CHOLECYSTECTOMY  01/25/2011   Procedure: LAPAROSCOPIC CHOLECYSTECTOMY WITH INTRAOPERATIVE CHOLANGIOGRAM;  Surgeon: Judieth Keens, DO;  Location: [facility];  Service: General;  Laterality: N/A;  . COLONOSCOPY     several  . CORONARY ANGIOPLASTY     most recent 11/2009  . CORONARY STENT PLACEMENT  2009   in LAD and side branch PTCA  . INTERCOSTAL NERVE BLOCK  2011, 06/2016   x2. lumbar spine  . LEFT HEART CATHETERIZATION WITH CORONARY ANGIOGRAM Bilateral 10/12/2011   Procedure: LEFT HEART CATHETERIZATION WITH CORONARY ANGIOGRAM;  Surgeon: Candee Furbish, MD;  Location: [facility] CATH LAB;  Service: Cardiovascular;  Laterality: Bilateral;  . LEFT HEART CATHETERIZATION WITH CORONARY ANGIOGRAM N/A 04/01/2014   Procedure: LEFT HEART CATHETERIZATION WITH CORONARY ANGIOGRAM;  Surgeon: Candee Furbish, MD;  Location: [facility] CATH LAB;  Service: Cardiovascular;  Laterality: N/A;  Current Meds  Medication Sig  . acetaminophen (TYLENOL) 500 MG tablet Take 500 mg by mouth daily as needed.   Marland Kitchen albuterol (PROVENTIL) (2.5 MG/3ML) 0.083% nebulizer solution Take 3 mLs (2.5 mg total) by nebulization every 6 (six) hours as needed for wheezing or shortness of breath. Dx Code D86.0  . Albuterol Sulfate (PROAIR RESPICLICK) 166 (90 BASE) MCG/ACT AEPB Inhale 2 puffs into the lungs every 6 (six) hours as needed.  Marland Kitchen aspirin EC 81 MG tablet Take 81 mg by mouth daily.  . Cholecalciferol (VITAMIN D3) 3000 UNITS TABS Take 3,000 Units by mouth daily.  . clopidogrel (PLAVIX) 75 MG tablet Take 1 tablet (75  mg total) by mouth daily.  . Coenzyme Q10 (COQ10 MAXIMUM STRENGTH PO) Take 1 tablet by mouth daily with lunch.  . ferrous sulfate 325 (65 FE) MG EC tablet Take 1 tablet (325 mg total) by mouth 3 (three) times daily with meals.  . fish oil-omega-3 fatty acids 1000 MG capsule Take 3 g by mouth daily with lunch. Omega X - 3  . furosemide (LASIX) 80 MG tablet Take 80 mg by mouth 3 (three) times daily.  Marland Kitchen glipiZIDE (GLUCOTROL) 10 MG tablet Take 20 mg by mouth 2 (two) times daily.    . insulin aspart protamine-insulin aspart (NOVOLOG 70/30) (70-30) 100 UNIT/ML injection Inject 40-50 Units into the skin 3 (three) times daily as needed (CBG >100). If blood sugar <100 0 units, 100-149 40 units, >150 units 50 units  . levothyroxine (SYNTHROID, LEVOTHROID) 50 MCG tablet Take 1 tablet by mouth daily.  Marland Kitchen losartan (COZAAR) 100 MG tablet Take 100 mg by mouth daily.  . mometasone (NASONEX) 50 MCG/ACT nasal spray Place 2 sprays into the nose daily as needed (congestion).  . pantoprazole (PROTONIX) 40 MG tablet Take 40 mg by mouth daily.  . potassium chloride (KLOR-CON M10) 10 MEQ tablet Take 2 tablets (20 mEq total) by mouth 2 (two) times daily.  . predniSONE (DELTASONE) 10 MG tablet Take 1 tablet (10 mg total) by mouth daily with breakfast. T  . RABEprazole (ACIPHEX) 20 MG tablet Take 1 tablet (20 mg total) by mouth 2 (two) times daily as needed.  . sulfamethoxazole-trimethoprim (BACTRIM DS,SEPTRA DS) 800-160 MG tablet Take 1 tablet by mouth 2 (two) times daily.  . Vitamin D, Ergocalciferol, (DRISDOL) 50000 UNITS CAPS capsule Take 50,000 Units by mouth every 7 (seven) days. On Sundays     Allergies:   Hydrocortisone and Statins   Social History   Tobacco Use  . Smoking status: Never Smoker  . Smokeless tobacco: Never Used  Substance Use Topics  . Alcohol use: No  . Drug use: No     Family Hx: The patient's family history includes Anesthesia problems in his sister; Asthma in his sister; Diabetes in his  mother; Heart attack in his father; Kidney cancer in his mother; Kidney disease in his mother. There is no history of Colon cancer, Colon polyps, Rectal cancer, or Stomach cancer.  ROS:   Please see the history of present illness.    Positive for worsening lower extremity edema despite increasing Lasix, increasing shortness of breath with activity, no chest pain, no bleeding.  Positive cough chronic. All other systems reviewed and are negative.   Prior CV studies:   The following studies were reviewed today:  Prior echocardiogram normal EF.  Prior cardiac catheterization 2016 as described below.  Labs/Other Tests and Data Reviewed:    EKG:  An ECG dated 10/24/17 was personally reviewed today  and demonstrated:  Sinus rhythm with left anterior fascicular block  Recent Labs: 02/19/2018: BUN 28; Creatinine, Ser 1.56; Potassium 3.7; Sodium 143   Recent Lipid Panel Lab Results  Component Value Date/Time   CHOL (H) 08/15/2008 04:08 AM    206        ATP III CLASSIFICATION:  <200     mg/dL   Desirable  200-239  mg/dL   Borderline High  >=240    mg/dL   High          TRIG 205 (H) 08/15/2008 04:08 AM   HDL 18 (L) 08/15/2008 04:08 AM   CHOLHDL 11.4 08/15/2008 04:08 AM   LDLCALC (H) 08/15/2008 04:08 AM    147        Total Cholesterol/HDL:CHD Risk Coronary Heart Disease Risk Table                     Men   Women  1/2 Average Risk   3.4   3.3  Average Risk       5.0   4.4  2 X Average Risk   9.6   7.1  3 X Average Risk  23.4   11.0        Use the calculated Patient Ratio above and the CHD Risk Table to determine the patient's CHD Risk.        ATP III CLASSIFICATION (LDL):  <100     mg/dL   Optimal  100-129  mg/dL   Near or Above                    Optimal  130-159  mg/dL   Borderline  160-189  mg/dL   High  >190     mg/dL   Very High    Wt Readings from Last 3 Encounters:  06/28/18 234 lb (106.1 kg)  10/24/17 228 lb 6.4 oz (103.6 kg)  05/04/17 237 lb (107.5 kg)      Objective:    Vital Signs:  BP (!) 136/92   Ht 5\' 10"  (1.778 m)   Wt 234 lb (106.1 kg)   BMI 33.58 kg/m    VITAL SIGNS:  reviewed GEN:  no acute distress EYES:  sclerae anicteric, EOMI - Extraocular Movements Intact RESPIRATORY:  normal respiratory effort, symmetric expansion CARDIOVASCULAR:  increased edema SKIN:  no rash, lesions or ulcers. MUSCULOSKELETAL:  no obvious deformities. NEURO:  alert and oriented x 3, no obvious focal deficit PSYCH:  normal affect  ASSESSMENT & PLAN:    Shortness of breath with significant ankle edema/acute on chronic diastolic heart failure in combination with underlying restrictive lung disease/obesity/coronary artery disease - Prior EF normal.  I will repeat echocardiogram to ensure that there is not been any significant changes.  Certainly his ongoing daily prednisone use is not helping his fluid situation but this is necessary for him to function. - He is on Lasix 80 mg 3 times a day. - I would like to give him metolazone 5 mg once a week to help with further diuresis. -He understands fluid management intake, salt intake. -I will also check some lab work, BNP, basic metabolic profile, CBC, TSH, free T4 and lipids.  Last heart catheterization was on 04/01/2014-previously placed LAD and posterior lateral stents were widely patent and there was mild stenosis in the distal LAD with a small caliber distal LAD segment.  Moderate disease in the AV groove circumflex of 50%.  Did not appear to be flow-limiting.  Circumflex was  small in caliber.  His EF was 60% at the time with LVEDP of 15.  That catheterization was done because of continued shortness of breath.  He also complained of fluid in his legs at that time.   COVID-19 Education: The signs and symptoms of COVID-19 were discussed with the patient and how to seek care for testing (follow up with PCP or arrange E-visit).  The importance of social distancing was discussed today.  Time:   Today, I have  spent 20 minutes with the patient with telehealth technology discussing the above problems.     Medication Adjustments/Labs and Tests Ordered: Current medicines are reviewed at length with the patient today.  Concerns regarding medicines are outlined above.   Tests Ordered: Orders Placed This Encounter  Procedures  . CBC  . Basic metabolic panel  . Lipid panel  . T4, free  . TSH  . Pro b natriuretic peptide  . ECHOCARDIOGRAM COMPLETE    Medication Changes: Meds ordered this encounter  Medications  . metolazone (ZAROXOLYN) 5 MG tablet    Sig: Take 1 tablet (5 mg total) by mouth as directed.    Dispense:  30 tablet    Refill:  1    Disposition:  Follow up in 1 month(s)  Signed, Candee Furbish, MD  06/28/2018 4:08 PM    [facility]

## 2018-06-28 NOTE — Patient Instructions (Signed)
Medication Instructions:  Please take Metolazone 5 mg once a week. Continue all other medications as listed.  If you need a refill on your cardiac medications before your next appointment, please call your pharmacy.   Lab work: Please have blood work the same day as your echocardiogram.(CBC, BMP, TSH, Free T4, Pro-BNP and Lipid) If you have labs (blood work) drawn today and your tests are completely normal, you will receive your results only by: Marland Kitchen MyChart Message (if you have MyChart) OR . A paper copy in the mail If you have any lab test that is abnormal or we need to change your treatment, we will call you to review the results.  Testing/Procedures: Your physician has requested that you have an echocardiogram within 1 week. Echocardiography is a painless test that uses sound waves to create images of your heart. It provides your doctor with information about the size and shape of your heart and how well your heart's chambers and valves are working. This procedure takes approximately one hour. There are no restrictions for this procedure.  Follow-Up: Follow up with Dr Marlou Porch in 1 month. (Virtual visit)  Thank you for choosing [facility]!!

## 2018-07-18 DIAGNOSIS — N183 Chronic kidney disease, stage 3 (moderate): Secondary | ICD-10-CM | POA: Diagnosis not present

## 2018-07-18 DIAGNOSIS — J301 Allergic rhinitis due to pollen: Secondary | ICD-10-CM | POA: Diagnosis not present

## 2018-07-18 DIAGNOSIS — R5383 Other fatigue: Secondary | ICD-10-CM | POA: Diagnosis not present

## 2018-07-18 DIAGNOSIS — D869 Sarcoidosis, unspecified: Secondary | ICD-10-CM | POA: Diagnosis not present

## 2018-07-18 DIAGNOSIS — I10 Essential (primary) hypertension: Secondary | ICD-10-CM | POA: Diagnosis not present

## 2018-07-18 DIAGNOSIS — E113299 Type 2 diabetes mellitus with mild nonproliferative diabetic retinopathy without macular edema, unspecified eye: Secondary | ICD-10-CM | POA: Diagnosis not present

## 2018-07-18 DIAGNOSIS — Z794 Long term (current) use of insulin: Secondary | ICD-10-CM | POA: Diagnosis not present

## 2018-07-18 DIAGNOSIS — E1142 Type 2 diabetes mellitus with diabetic polyneuropathy: Secondary | ICD-10-CM | POA: Diagnosis not present

## 2018-07-18 DIAGNOSIS — E1122 Type 2 diabetes mellitus with diabetic chronic kidney disease: Secondary | ICD-10-CM | POA: Diagnosis not present

## 2018-07-18 DIAGNOSIS — E1165 Type 2 diabetes mellitus with hyperglycemia: Secondary | ICD-10-CM | POA: Diagnosis not present

## 2018-07-18 DIAGNOSIS — E039 Hypothyroidism, unspecified: Secondary | ICD-10-CM | POA: Diagnosis not present

## 2018-07-18 DIAGNOSIS — D860 Sarcoidosis of lung: Secondary | ICD-10-CM | POA: Diagnosis not present

## 2018-07-18 DIAGNOSIS — D508 Other iron deficiency anemias: Secondary | ICD-10-CM | POA: Diagnosis not present

## 2018-07-18 DIAGNOSIS — E559 Vitamin D deficiency, unspecified: Secondary | ICD-10-CM | POA: Diagnosis not present

## 2018-07-21 ENCOUNTER — Other Ambulatory Visit: Payer: Self-pay | Admitting: Cardiology

## 2018-07-25 ENCOUNTER — Telehealth (HOSPITAL_COMMUNITY): Payer: Self-pay | Admitting: Cardiology

## 2018-07-25 NOTE — Telephone Encounter (Signed)

## 2018-07-27 ENCOUNTER — Ambulatory Visit (HOSPITAL_COMMUNITY): Payer: Medicare HMO | Attending: Cardiology

## 2018-07-27 ENCOUNTER — Other Ambulatory Visit: Payer: Self-pay

## 2018-07-27 ENCOUNTER — Other Ambulatory Visit: Payer: Medicare HMO | Admitting: *Deleted

## 2018-07-27 DIAGNOSIS — I5032 Chronic diastolic (congestive) heart failure: Secondary | ICD-10-CM

## 2018-07-27 DIAGNOSIS — I2510 Atherosclerotic heart disease of native coronary artery without angina pectoris: Secondary | ICD-10-CM

## 2018-07-27 DIAGNOSIS — Z79899 Other long term (current) drug therapy: Secondary | ICD-10-CM

## 2018-07-27 DIAGNOSIS — R0602 Shortness of breath: Secondary | ICD-10-CM

## 2018-07-28 LAB — BASIC METABOLIC PANEL
BUN/Creatinine Ratio: 14 (ref 10–24)
BUN: 22 mg/dL (ref 8–27)
CO2: 26 mmol/L (ref 20–29)
Calcium: 9.4 mg/dL (ref 8.6–10.2)
Chloride: 101 mmol/L (ref 96–106)
Creatinine, Ser: 1.53 mg/dL — ABNORMAL HIGH (ref 0.76–1.27)
GFR calc Af Amer: 52 mL/min/{1.73_m2} — ABNORMAL LOW (ref >59)
GFR calc non Af Amer: 45 mL/min/{1.73_m2} — ABNORMAL LOW (ref >59)
Glucose: 150 mg/dL — ABNORMAL HIGH (ref 65–99)
Potassium: 3.5 mmol/L (ref 3.5–5.2)
Sodium: 144 mmol/L (ref 134–144)

## 2018-07-28 LAB — CBC
Hematocrit: 37.1 % — ABNORMAL LOW (ref 37.5–51.0)
Hemoglobin: 13 g/dL (ref 13.0–17.7)
MCH: 32.3 pg (ref 26.6–33.0)
MCHC: 35 g/dL (ref 31.5–35.7)
MCV: 92 fL (ref 79–97)
Platelets: 309 10*3/uL (ref 150–450)
RBC: 4.03 x10E6/uL — ABNORMAL LOW (ref 4.14–5.80)
RDW: 13.8 % (ref 11.6–15.4)
WBC: 12.2 10*3/uL — ABNORMAL HIGH (ref 3.4–10.8)

## 2018-07-28 LAB — LIPID PANEL
Chol/HDL Ratio: 6.8 ratio — ABNORMAL HIGH (ref 0.0–5.0)
Cholesterol, Total: 219 mg/dL — ABNORMAL HIGH (ref 100–199)
HDL: 32 mg/dL — ABNORMAL LOW (ref >39)
LDL Calculated: 133 mg/dL — ABNORMAL HIGH (ref 0–99)
Triglycerides: 269 mg/dL — ABNORMAL HIGH (ref 0–149)
VLDL Cholesterol Cal: 54 mg/dL — ABNORMAL HIGH (ref 5–40)

## 2018-07-28 LAB — T4, FREE: Free T4: 1.22 ng/dL (ref 0.82–1.77)

## 2018-07-28 LAB — PRO B NATRIURETIC PEPTIDE: NT-Pro BNP: 124 pg/mL (ref 0–376)

## 2018-07-28 LAB — TSH: TSH: 6.2 u[IU]/mL — ABNORMAL HIGH (ref 0.450–4.500)

## 2018-08-01 ENCOUNTER — Encounter: Payer: Self-pay | Admitting: Cardiology

## 2018-08-01 ENCOUNTER — Telehealth (INDEPENDENT_AMBULATORY_CARE_PROVIDER_SITE_OTHER): Payer: Medicare HMO | Admitting: Cardiology

## 2018-08-01 ENCOUNTER — Telehealth: Payer: Self-pay

## 2018-08-01 VITALS — BP 127/68 | HR 86 | Ht 70.0 in | Wt 234.0 lb

## 2018-08-01 DIAGNOSIS — R0789 Other chest pain: Secondary | ICD-10-CM

## 2018-08-01 DIAGNOSIS — E669 Obesity, unspecified: Secondary | ICD-10-CM | POA: Diagnosis not present

## 2018-08-01 DIAGNOSIS — I2510 Atherosclerotic heart disease of native coronary artery without angina pectoris: Secondary | ICD-10-CM | POA: Diagnosis not present

## 2018-08-01 NOTE — Patient Instructions (Addendum)
Medication Instructions:  Your physician has recommended you make the following change in your medication:   STOP: metolazone  If you need a refill on your cardiac medications before your next appointment, please call your pharmacy.   Lab work: None ordered If you have labs (blood work) drawn today and your tests are completely normal, you will receive your results only by: Marland Kitchen MyChart Message (if you have MyChart) OR . A paper copy in the mail If you have any lab test that is abnormal or we need to change your treatment, we will call you to review the results.  Testing/Procedures: Your physician has requested that you have a cardiac catheterization on 08/07/18. Cardiac catheterization is used to diagnose and/or treat various heart conditions. Doctors may recommend this procedure for a number of different reasons. The most common reason is to evaluate chest pain. Chest pain can be a symptom of coronary artery disease (CAD), and cardiac catheterization can show whether plaque is narrowing or blocking your heart's arteries. This procedure is also used to evaluate the valves, as well as measure the blood flow and oxygen levels in different parts of your heart. For further information please visit [email or web address]. Please follow instruction sheet, as given.   Follow-Up: . Follow up with Dr. Marlou Porch via VIDEO Visit on 08/24/18 at 2:40 PM.  Any Other Special Instructions Will Be Listed Below (If Applicable).      [facility] OFFICE [place], [place] [address] Dept: [phone] Loc: [facility]  08/01/2018  You are scheduled for a Cardiac Catheterization on Tuesday, June 2 with Dr. Larae Grooms.  1. Please arrive at the [facility] (Main Entrance A) at [facility]: [address] [place], [place] [address] at 7:00 AM (This time is two hours before your procedure to  ensure your preparation). Free valet parking service is available.   Special note: Every effort is made to have your procedure done on time. Please understand that emergencies sometimes delay scheduled procedures.  2. Diet: Do not eat solid foods after midnight.  The patient may have clear liquids until 5am upon the day of the procedure.  3. Labs: Already done  Your Pre-procedure COVID-19 Testing will be done on 08/03/18 at 2:10 PM at [facility] at [address] [place],  [address] After your swab you will be given a mask to wear and instructed to go home and quarantine/no visitors until after your procedure. If you test positive you will be notified and your procedure will be cancelled.    4. Medication instructions in preparation for your procedure:   Contrast Allergy: No  DO NOT take losartan the day before and the morning of your procedure  DO NOT take furosemide (lasix) the day before and the morning of your procedure  DO NOT take glipizide the morning of your procedure  DO NOT use insulin the morning of your procedure  On the morning of your procedure, take your Aspirin and Plavix/Clopidogrel and any morning medicines NOT listed above.  You may use sips of water.  5. Plan for one night stay--bring personal belongings. 6. Bring a current list of your medications and current insurance cards. 7. You MUST have a responsible person to drive you home. 8. Someone MUST be with you the first 24 hours after you arrive home or your discharge will be delayed. 9. Please wear  clothes that are easy to get on and off and wear slip-on shoes.  Thank you for allowing [place] to care for you!   -- [facility]

## 2018-08-01 NOTE — H&P (View-Only) (Signed)
Virtual Visit via Video Note   This visit type was conducted due to national recommendations for restrictions regarding the COVID-19 Pandemic (e.g. social distancing) in an effort to limit this patient's exposure and mitigate transmission in our community.  Due to his co-morbid illnesses, this patient is at least at moderate risk for complications without adequate follow up.  This format is felt to be most appropriate for this patient at this time.  All issues noted in this document were discussed and addressed.  A limited physical exam was performed with this format.  Please refer to the patient's chart for his consent to telehealth for [facility].   Date:  08/01/2018   ID:  [name] 05/03/1945, MRN [id number]  Patient Location: Home Provider Location: Home  PCP:  Craig Frees, MD  Cardiologist:  Craig Furbish, MD  Electrophysiologist:  None   Evaluation Performed:  Follow-Up Visit  Chief Complaint: Shortness of breath edema chest pain  History of Present Illness:    [name] is a 73 y.o. male with coronary artery disease, obesity, diabetes with chronic kidney disease stage III and adrenal insufficiency who has been having ongoing lower extremity edema, chest pain, shortness of breath.  He states that he feels like 1 of his stents may be closed.  We tried metolazone once a week and this did not help him urinate anymore, he states he felt terrible for the next day or 2 after that medication.  We will stop.  We talked about heart catheterization.  He states that his edema increases dramatically early in the morning.  Compression hose do not seem to help.  The patient does not have symptoms concerning for COVID-19 infection (fever, chills, cough, or new shortness of breath).    Past Medical History:  Diagnosis Date   Adrenal insufficiency (HCC)    Allergy    Anemia    Arthritis    feet    CKD (chronic kidney disease), stage III ([place])    Coronary artery  disease    has stents   Coronary atherosclerosis of native coronary artery    Proximal LAD, posterior lateral stent widely patent-10/12/11   Diabetes mellitus    insulin and pills   Hearing loss    wears hearing aids   Heart attack ([place]) 2010   History of blood transfusion 06/30/2016   [name] - 2 units transfused   Hypertension    OSA (obstructive sleep apnea)    uses VPAC sleep study 2 years done through [facility] [facility]. Dr. Marlou [name] arranged study   Pneumonia    3-4 years ago   Sarcoid    Sleep apnea    uses CPAP nightly   Thyroid disease    Type 2 diabetes mellitus [facility])    Past Surgical History:  Procedure Laterality Date   APPENDECTOMY     CATARACT EXTRACTION  2011   bilat   CHOLECYSTECTOMY  01/25/2011   Procedure: LAPAROSCOPIC CHOLECYSTECTOMY WITH INTRAOPERATIVE CHOLANGIOGRAM;  Surgeon: Craig Keens, DO;  Location: [place];  Service: General;  Laterality: N/A;   COLONOSCOPY     several   CORONARY ANGIOPLASTY     most recent 11/2009   CORONARY STENT PLACEMENT  2009   in LAD and side branch PTCA   INTERCOSTAL NERVE BLOCK  2011, 06/2016   x2. lumbar spine   LEFT HEART CATHETERIZATION WITH CORONARY ANGIOGRAM Bilateral 10/12/2011   Procedure: LEFT HEART CATHETERIZATION WITH CORONARY ANGIOGRAM;  Surgeon: Craig Furbish, MD;  Location:  [place] CATH LAB;  Service: Cardiovascular;  Laterality: Bilateral;   LEFT HEART CATHETERIZATION WITH CORONARY ANGIOGRAM N/A 04/01/2014   Procedure: LEFT HEART CATHETERIZATION WITH CORONARY ANGIOGRAM;  Surgeon: Craig Furbish, MD;  Location: [facility] CATH LAB;  Service: Cardiovascular;  Laterality: N/A;     Current Meds  Medication Sig   acetaminophen (TYLENOL) 500 MG tablet Take 500 mg by mouth daily as needed.    albuterol (PROVENTIL) (2.5 MG/3ML) 0.083% nebulizer solution Take 3 mLs (2.5 mg total) by nebulization every 6 (six) hours as needed for wheezing or shortness of breath. Dx Code D86.0   Albuterol Sulfate (PROAIR RESPICLICK) 409 (90  BASE) MCG/ACT AEPB Inhale 2 puffs into the lungs every 6 (six) hours as needed.   aspirin EC 81 MG tablet Take 81 mg by mouth daily.   Cholecalciferol (VITAMIN D3) 3000 UNITS TABS Take 3,000 Units by mouth daily.   clopidogrel (PLAVIX) 75 MG tablet Take 1 tablet (75 mg total) by mouth daily.   Coenzyme Q10 (COQ10 MAXIMUM STRENGTH PO) Take 1 tablet by mouth daily with lunch.   ferrous sulfate 325 (65 FE) MG EC tablet Take 1 tablet (325 mg total) by mouth 3 (three) times daily with meals.   fish oil-omega-3 fatty acids 1000 MG capsule Take 3 g by mouth daily with lunch. Omega X - 3   furosemide (LASIX) 80 MG tablet Take 80 mg by mouth 3 (three) times daily.   glipiZIDE (GLUCOTROL) 10 MG tablet Take 20 mg by mouth 2 (two) times daily.     insulin aspart protamine-insulin aspart (NOVOLOG 70/30) (70-30) 100 UNIT/ML injection Inject 40-50 Units into the skin 3 (three) times daily as needed (CBG >100). If blood sugar <100 0 units, 100-149 40 units, >150 units 50 units   levothyroxine (SYNTHROID, LEVOTHROID) 50 MCG tablet Take 1 tablet by mouth daily.   losartan (COZAAR) 100 MG tablet Take 100 mg by mouth daily.   mometasone (NASONEX) 50 MCG/ACT nasal spray Place 2 sprays into the nose daily as needed (congestion).   montelukast (SINGULAIR) 10 MG tablet Take 10 mg by mouth daily.   pantoprazole (PROTONIX) 40 MG tablet Take 40 mg by mouth daily.   potassium chloride (KLOR-CON M10) 10 MEQ tablet Take 2 tablets (20 mEq total) by mouth 2 (two) times daily.   predniSONE (DELTASONE) 10 MG tablet Take 1 tablet (10 mg total) by mouth daily with breakfast. T   RABEprazole (ACIPHEX) 20 MG tablet Take 1 tablet (20 mg total) by mouth 2 (two) times daily as needed.   sulfamethoxazole-trimethoprim (BACTRIM DS,SEPTRA DS) 800-160 MG tablet Take 1 tablet by mouth 2 (two) times daily.   Vitamin D, Ergocalciferol, (DRISDOL) 50000 UNITS CAPS capsule Take 50,000 Units by mouth every 7 (seven) days. On  Sundays   [DISCONTINUED] metolazone (ZAROXOLYN) 5 MG tablet TAKE 1 TABLET (5 MG TOTAL) BY MOUTH AS DIRECTED.     Allergies:   Hydrocortisone and Statins   Social History   Tobacco Use   Smoking status: Never Smoker   Smokeless tobacco: Never Used  Substance Use Topics   Alcohol use: No   Drug use: No     Family Hx: The patient's family history includes Anesthesia problems in his sister; Asthma in his sister; Diabetes in his mother; Heart attack in his father; Kidney cancer in his mother; Kidney disease in his mother. There is no history of Colon cancer, Colon polyps, Rectal cancer, or Stomach cancer.  ROS:   Please see the history of present illness.  Positive chest pain shortness of breath.  No bleeding no fevers no chills no cough All other systems reviewed and are negative.   Prior CV studies:   The following studies were reviewed today:  ECHO 07/27/18:  1. The left ventricle has normal systolic function with an ejection fraction of 60-65%. The cavity size was normal. There is mildly increased left ventricular wall thickness. Left ventricular diastolic Doppler parameters are consistent with impaired  relaxation.  2. The right ventricle has normal systolic function. The cavity was normal. There is no increase in right ventricular wall thickness.  3. The aortic valve is tricuspid. Mild thickening of the aortic valve. Mild calcification of the aortic valve.  Labs/Other Tests and Data Reviewed:    EKG:   10/24/17 was personally reviewed today and demonstrated:  Sinus rhythm with left anterior fascicular block  Recent Labs: 07/27/2018: BUN 22; Creatinine, Ser 1.53; Hemoglobin 13.0; NT-Pro BNP 124; Platelets 309; Potassium 3.5; Sodium 144; TSH 6.200   Recent Lipid Panel Lab Results  Component Value Date/Time   CHOL 219 (H) 07/27/2018 12:39 PM   TRIG 269 (H) 07/27/2018 12:39 PM   HDL 32 (L) 07/27/2018 12:39 PM   CHOLHDL 6.8 (H) 07/27/2018 12:39 PM   CHOLHDL 11.4  08/15/2008 04:08 AM   LDLCALC 133 (H) 07/27/2018 12:39 PM    Wt Readings from Last 3 Encounters:  08/01/18 234 lb (106.1 kg)  06/28/18 234 lb (106.1 kg)  10/24/17 228 lb 6.4 oz (103.6 kg)     Objective:    Vital Signs:  BP 127/68    Pulse 86    Ht 5\' 10"  (1.778 m)    Wt 234 lb (106.1 kg)    BMI 33.58 kg/m    VITAL SIGNS:  reviewed GEN:  no acute distress EYES:  sclerae anicteric, EOMI - Extraocular Movements Intact RESPIRATORY:  normal respiratory effort, symmetric expansion SKIN:  no rash, lesions or ulcers. MUSCULOSKELETAL:  no obvious deformities. NEURO:  alert and oriented x 3, no obvious focal deficit PSYCH:  normal affect  ASSESSMENT & PLAN:    Ongoing chest pain, edema, shortness of breath, coronary artery disease, obesity  -We will go ahead and proceed with right and left heart catheterization.  We will hold his Lasix and losartan day prior to procedure.  Last creatinine 1.5. -Stop metolazone altogether.  It did not help him with his diuresis and it made him feel poorly.    COVID-19 Education: The signs and symptoms of COVID-19 were discussed with the patient and how to seek care for testing (follow up with PCP or arrange E-visit).  The importance of social distancing was discussed today.  Time:   Today, I have spent 15 minutes with the patient with telehealth technology discussing the above problems.     Medication Adjustments/Labs and Tests Ordered: Current medicines are reviewed at length with the patient today.  Concerns regarding medicines are outlined above.   Tests Ordered: No orders of the defined types were placed in this encounter.   Medication Changes: No orders of the defined types were placed in this encounter.   Disposition:  Follow up in 1 month(s)  Signed, Craig Furbish, MD  08/01/2018 4:17 PM    [facility]

## 2018-08-01 NOTE — Telephone Encounter (Signed)
Virtual Visit Pre-Appointment Phone Call  TELEPHONE CALL NOTE  [name] has been deemed a candidate for a follow-up tele-health visit to limit community exposure during the Covid-19 pandemic. I spoke with the patient via phone to ensure availability of phone/video source, confirm preferred email & phone number, and discuss instructions and expectations.  I reminded [name] to be prepared with any vital sign and/or heart rhythm information that could potentially be obtained via home monitoring, at the time of his visit. I reminded [name] to expect a phone call prior to his visit.  Patient agrees to consent below.  Craig Gustin, RN 08/01/2018 11:05 AM    FULL LENGTH CONSENT FOR TELE-HEALTH VISIT   I hereby voluntarily request, consent and authorize [facility] and its employed or contracted physicians, physician assistants, nurse practitioners or other licensed health care professionals (the Practitioner), to provide me with telemedicine health care services (the "Services") as deemed necessary by the treating Practitioner. I acknowledge and consent to receive the Services by the Practitioner via telemedicine. I understand that the telemedicine visit will involve communicating with the Practitioner through live audiovisual communication technology and the disclosure of certain medical information by electronic transmission. I acknowledge that I have been given the opportunity to request an in-person assessment or other available alternative prior to the telemedicine visit and am voluntarily participating in the telemedicine visit.  I understand that I have the right to withhold or withdraw my consent to the use of telemedicine in the course of my care at any time, without affecting my right to future care or treatment, and that the Practitioner or I may terminate the telemedicine visit at any time. I understand that I have the right to inspect all information obtained and/or  recorded in the course of the telemedicine visit and may receive copies of available information for a reasonable fee.  I understand that some of the potential risks of receiving the Services via telemedicine include:  Marland Kitchen Delay or interruption in medical evaluation due to technological equipment failure or disruption; . Information transmitted may not be sufficient (e.g. poor resolution of images) to allow for appropriate medical decision making by the Practitioner; and/or  . In rare instances, security protocols could fail, causing a breach of personal health information.  Furthermore, I acknowledge that it is my responsibility to provide information about my medical history, conditions and care that is complete and accurate to the best of my ability. I acknowledge that Practitioner's advice, recommendations, and/or decision may be based on factors not within their control, such as incomplete or inaccurate data provided by me or distortions of diagnostic images or specimens that may result from electronic transmissions. I understand that the practice of medicine is not an exact science and that Practitioner makes no warranties or guarantees regarding treatment outcomes. I acknowledge that I will receive a copy of this consent concurrently upon execution via email to the email address I last provided but may also request a printed copy by calling the office of [place].    I understand that my insurance will be billed for this visit.   I have read or had this consent read to me. . I understand the contents of this consent, which adequately explains the benefits and risks of the Services being provided via telemedicine.  . I have been provided ample opportunity to ask questions regarding this consent and the Services and have had my questions answered to my satisfaction. Marland Kitchen  I give my informed consent for the services to be provided through the use of telemedicine in my medical care  By participating  in this telemedicine visit I agree to the above.

## 2018-08-01 NOTE — Progress Notes (Signed)
Virtual Visit via Video Note   This visit type was conducted due to national recommendations for restrictions regarding the COVID-19 Pandemic (e.g. social distancing) in an effort to limit this patient's exposure and mitigate transmission in our community.  Due to his co-morbid illnesses, this patient is at least at moderate risk for complications without adequate follow up.  This format is felt to be most appropriate for this patient at this time.  All issues noted in this document were discussed and addressed.  A limited physical exam was performed with this format.  Please refer to the patient's chart for his consent to telehealth for [facility].   Date:  08/01/2018   ID:  [name], [name] December 09, 1945, MRN [id number]  Patient Location: Home Provider Location: Home  PCP:  Shirline Frees, MD  Cardiologist:  Candee Furbish, MD  Electrophysiologist:  None   Evaluation Performed:  Follow-Up Visit  Chief Complaint: Shortness of breath edema chest pain  History of Present Illness:    [name] is a 73 y.o. male with coronary artery disease, obesity, diabetes with chronic kidney disease stage III and adrenal insufficiency who has been having ongoing lower extremity edema, chest pain, shortness of breath.  He states that he feels like 1 of his stents may be closed.  We tried metolazone once a week and this did not help him urinate anymore, he states he felt terrible for the next day or 2 after that medication.  We will stop.  We talked about heart catheterization.  He states that his edema increases dramatically early in the morning.  Compression hose do not seem to help.  The patient does not have symptoms concerning for COVID-19 infection (fever, chills, cough, or new shortness of breath).    Past Medical History:  Diagnosis Date   Adrenal insufficiency (HCC)    Allergy    Anemia    Arthritis    feet    CKD (chronic kidney disease), stage III ([place])    Coronary artery  disease    has stents   Coronary atherosclerosis of native coronary artery    Proximal LAD, posterior lateral stent widely patent-10/12/11   Diabetes mellitus    insulin and pills   Hearing loss    wears hearing aids   Heart attack ([place]) 2010   History of blood transfusion 06/30/2016   [facility] - 2 units transfused   Hypertension    OSA (obstructive sleep apnea)    uses VPAC sleep study 2 years done through [place]. Dr. Marlou Porch arranged study   Pneumonia    3-4 years ago   Sarcoid    Sleep apnea    uses CPAP nightly   Thyroid disease    Type 2 diabetes mellitus [facility])    Past Surgical History:  Procedure Laterality Date   APPENDECTOMY     CATARACT EXTRACTION  2011   bilat   CHOLECYSTECTOMY  01/25/2011   Procedure: LAPAROSCOPIC CHOLECYSTECTOMY WITH INTRAOPERATIVE CHOLANGIOGRAM;  Surgeon: Judieth Keens, DO;  Location: [place];  Service: General;  Laterality: N/A;   COLONOSCOPY     several   CORONARY ANGIOPLASTY     most recent 11/2009   CORONARY STENT PLACEMENT  2009   in LAD and side branch PTCA   INTERCOSTAL NERVE BLOCK  2011, 06/2016   x2. lumbar spine   LEFT HEART CATHETERIZATION WITH CORONARY ANGIOGRAM Bilateral 10/12/2011   Procedure: LEFT HEART CATHETERIZATION WITH CORONARY ANGIOGRAM;  Surgeon: Candee Furbish, MD;  Location:  [place] CATH LAB;  Service: Cardiovascular;  Laterality: Bilateral;   LEFT HEART CATHETERIZATION WITH CORONARY ANGIOGRAM N/A 04/01/2014   Procedure: LEFT HEART CATHETERIZATION WITH CORONARY ANGIOGRAM;  Surgeon: Candee Furbish, MD;  [facility];  Service: Cardiovascular;  Laterality: N/A;     Current Meds  Medication Sig   acetaminophen (TYLENOL) 500 MG tablet Take 500 mg by mouth daily as needed.    albuterol (PROVENTIL) (2.5 MG/3ML) 0.083% nebulizer solution Take 3 mLs (2.5 mg total) by nebulization every 6 (six) hours as needed for wheezing or shortness of breath. Dx Code D86.0   Albuterol Sulfate (PROAIR RESPICLICK) 161 (90  BASE) MCG/ACT AEPB Inhale 2 puffs into the lungs every 6 (six) hours as needed.   aspirin EC 81 MG tablet Take 81 mg by mouth daily.   Cholecalciferol (VITAMIN D3) 3000 UNITS TABS Take 3,000 Units by mouth daily.   clopidogrel (PLAVIX) 75 MG tablet Take 1 tablet (75 mg total) by mouth daily.   Coenzyme Q10 (COQ10 MAXIMUM STRENGTH PO) Take 1 tablet by mouth daily with lunch.   ferrous sulfate 325 (65 FE) MG EC tablet Take 1 tablet (325 mg total) by mouth 3 (three) times daily with meals.   fish oil-omega-3 fatty acids 1000 MG capsule Take 3 g by mouth daily with lunch. Omega X - 3   furosemide (LASIX) 80 MG tablet Take 80 mg by mouth 3 (three) times daily.   glipiZIDE (GLUCOTROL) 10 MG tablet Take 20 mg by mouth 2 (two) times daily.     insulin aspart protamine-insulin aspart (NOVOLOG 70/30) (70-30) 100 UNIT/ML injection Inject 40-50 Units into the skin 3 (three) times daily as needed (CBG >100). If blood sugar <100 0 units, 100-149 40 units, >150 units 50 units   levothyroxine (SYNTHROID, LEVOTHROID) 50 MCG tablet Take 1 tablet by mouth daily.   losartan (COZAAR) 100 MG tablet Take 100 mg by mouth daily.   mometasone (NASONEX) 50 MCG/ACT nasal spray Place 2 sprays into the nose daily as needed (congestion).   montelukast (SINGULAIR) 10 MG tablet Take 10 mg by mouth daily.   pantoprazole (PROTONIX) 40 MG tablet Take 40 mg by mouth daily.   potassium chloride (KLOR-CON M10) 10 MEQ tablet Take 2 tablets (20 mEq total) by mouth 2 (two) times daily.   predniSONE (DELTASONE) 10 MG tablet Take 1 tablet (10 mg total) by mouth daily with breakfast. T   RABEprazole (ACIPHEX) 20 MG tablet Take 1 tablet (20 mg total) by mouth 2 (two) times daily as needed.   sulfamethoxazole-trimethoprim (BACTRIM DS,SEPTRA DS) 800-160 MG tablet Take 1 tablet by mouth 2 (two) times daily.   Vitamin D, Ergocalciferol, (DRISDOL) 50000 UNITS CAPS capsule Take 50,000 Units by mouth every 7 (seven) days. On  Sundays   [DISCONTINUED] metolazone (ZAROXOLYN) 5 MG tablet TAKE 1 TABLET (5 MG TOTAL) BY MOUTH AS DIRECTED.     Allergies:   Hydrocortisone and Statins   Social History   Tobacco Use   Smoking status: Never Smoker   Smokeless tobacco: Never Used  Substance Use Topics   Alcohol use: No   Drug use: No     Family Hx: The patient's family history includes Anesthesia problems in his sister; Asthma in his sister; Diabetes in his mother; Heart attack in his father; Kidney cancer in his mother; Kidney disease in his mother. There is no history of Colon cancer, Colon polyps, Rectal cancer, or Stomach cancer.  ROS:   Please see the history of present illness.  Positive chest pain shortness of breath.  No bleeding no fevers no chills no cough All other systems reviewed and are negative.   Prior CV studies:   The following studies were reviewed today:  ECHO 07/27/18:  1. The left ventricle has normal systolic function with an ejection fraction of 60-65%. The cavity size was normal. There is mildly increased left ventricular wall thickness. Left ventricular diastolic Doppler parameters are consistent with impaired  relaxation.  2. The right ventricle has normal systolic function. The cavity was normal. There is no increase in right ventricular wall thickness.  3. The aortic valve is tricuspid. Mild thickening of the aortic valve. Mild calcification of the aortic valve.  Labs/Other Tests and Data Reviewed:    EKG:   10/24/17 was personally reviewed today and demonstrated:  Sinus rhythm with left anterior fascicular block  Recent Labs: 07/27/2018: BUN 22; Creatinine, Ser 1.53; Hemoglobin 13.0; NT-Pro BNP 124; Platelets 309; Potassium 3.5; Sodium 144; TSH 6.200   Recent Lipid Panel Lab Results  Component Value Date/Time   CHOL 219 (H) 07/27/2018 12:39 PM   TRIG 269 (H) 07/27/2018 12:39 PM   HDL 32 (L) 07/27/2018 12:39 PM   CHOLHDL 6.8 (H) 07/27/2018 12:39 PM   CHOLHDL 11.4  08/15/2008 04:08 AM   LDLCALC 133 (H) 07/27/2018 12:39 PM    Wt Readings from Last 3 Encounters:  08/01/18 234 lb (106.1 kg)  06/28/18 234 lb (106.1 kg)  10/24/17 228 lb 6.4 oz (103.6 kg)     Objective:    Vital Signs:  BP 127/68    Pulse 86    Ht 5\' 10"  (1.778 m)    Wt 234 lb (106.1 kg)    BMI 33.58 kg/m    VITAL SIGNS:  reviewed GEN:  no acute distress EYES:  sclerae anicteric, EOMI - Extraocular Movements Intact RESPIRATORY:  normal respiratory effort, symmetric expansion SKIN:  no rash, lesions or ulcers. MUSCULOSKELETAL:  no obvious deformities. NEURO:  alert and oriented x 3, no obvious focal deficit PSYCH:  normal affect  ASSESSMENT & PLAN:    Ongoing chest pain, edema, shortness of breath, coronary artery disease, obesity  -We will go ahead and proceed with right and left heart catheterization.  We will hold his Lasix and losartan day prior to procedure.  Last creatinine 1.5. -Stop metolazone altogether.  It did not help him with his diuresis and it made him feel poorly.    COVID-19 Education: The signs and symptoms of COVID-19 were discussed with the patient and how to seek care for testing (follow up with PCP or arrange E-visit).  The importance of social distancing was discussed today.  Time:   Today, I have spent 15 minutes with the patient with telehealth technology discussing the above problems.     Medication Adjustments/Labs and Tests Ordered: Current medicines are reviewed at length with the patient today.  Concerns regarding medicines are outlined above.   Tests Ordered: No orders of the defined types were placed in this encounter.   Medication Changes: No orders of the defined types were placed in this encounter.   Disposition:  Follow up in 1 month(s)  Signed, Candee Furbish, MD  08/01/2018 4:17 PM     [facility]

## 2018-08-03 ENCOUNTER — Other Ambulatory Visit (HOSPITAL_COMMUNITY)
Admission: RE | Admit: 2018-08-03 | Discharge: 2018-08-03 | Disposition: A | Discharge status: Home / Self Care | Payer: Medicare HMO | Source: Ambulatory Visit | Attending: Interventional Cardiology | Admitting: Interventional Cardiology

## 2018-08-03 DIAGNOSIS — Z1159 Encounter for screening for other viral diseases: Secondary | ICD-10-CM | POA: Diagnosis not present

## 2018-08-04 LAB — NOVEL CORONAVIRUS, NAA (HOSP ORDER, SEND-OUT TO REF LAB; TAT 18-24 HRS): SARS-CoV-2, NAA: NOT DETECTED

## 2018-08-06 ENCOUNTER — Telehealth: Payer: Self-pay | Admitting: *Deleted

## 2018-08-06 NOTE — Telephone Encounter (Signed)
Pt contacted pre-catheterization scheduled at [facility] for: Tuesday August 07, 2018 9 AM Verified arrival time and place: [place] Entrance A at: 7 AM  Covid-19 test date: 08/03/18-not detected  No solid food after midnight prior to cath, clear liquids until 5 AM day of procedure. Contrast allergy: no  Hold: Losartan-day before and day of procedure. Furosemide-day before and day of procedure. KCl-day before and day of procedure. Glipizide-AM of procedure. Insulin-AM of procedure  Except hold medications AM meds can be  taken pre-cath with sip of water including: ASA 81 mg Clopidogrel 75 mg  Pt advised due to Covid-19 pandemic no visitors are allowed in the hospital. Their designated party will be called when their procedure is over for an update and to arrange pick up.  Per Dr Leanne Chang Daily Covid Update 07/17/18: Universal masks: Effective immediately, we are requiring all ED patients and all ambulatory care patients to wear a universal mask, which we will provide. In-patients will have a mask at their bedside and will only be asked to put it on when a caregiver comes into their room.      COVID-19 Pre-Screening Questions:  . In the past 7 to 10 days have you had a cough,  shortness of breath, headache, congestion, fever (100 or greater) body aches, chills, sore throat, or sudden loss of taste or sense of smell? no . Have you been around anyone with known Covid 19? no . Have you been around anyone who is awaiting Covid 19 test results in the past 7 to 10 days? no . Have you been around anyone who has been exposed to Covid 19, or has mentioned symptoms of Covid 19 within the past 7 to 10 days? no   I reviewed procedure visitor/mask instructions, Covid 19 screening questions with patient, he verbalized understanding.

## 2018-08-07 ENCOUNTER — Other Ambulatory Visit: Payer: Self-pay

## 2018-08-07 ENCOUNTER — Ambulatory Visit (HOSPITAL_COMMUNITY)
Admission: RE | Admit: 2018-08-07 | Discharge: 2018-08-07 | Disposition: A | Discharge status: Home / Self Care | Payer: Medicare HMO | Source: Intra-hospital | Attending: Interventional Cardiology | Admitting: Interventional Cardiology

## 2018-08-07 ENCOUNTER — Encounter (HOSPITAL_COMMUNITY)
Admission: RE | Disposition: A | Discharge status: Home / Self Care | Payer: Medicare HMO | Source: Intra-hospital | Attending: Interventional Cardiology

## 2018-08-07 DIAGNOSIS — E2740 Unspecified adrenocortical insufficiency: Secondary | ICD-10-CM | POA: Insufficient documentation

## 2018-08-07 DIAGNOSIS — Z6833 Body mass index (BMI) 33.0-33.9, adult: Secondary | ICD-10-CM | POA: Insufficient documentation

## 2018-08-07 DIAGNOSIS — Z7951 Long term (current) use of inhaled steroids: Secondary | ICD-10-CM | POA: Insufficient documentation

## 2018-08-07 DIAGNOSIS — Z7982 Long term (current) use of aspirin: Secondary | ICD-10-CM | POA: Diagnosis not present

## 2018-08-07 DIAGNOSIS — Z888 Allergy status to other drugs, medicaments and biological substances status: Secondary | ICD-10-CM | POA: Insufficient documentation

## 2018-08-07 DIAGNOSIS — Z79899 Other long term (current) drug therapy: Secondary | ICD-10-CM | POA: Insufficient documentation

## 2018-08-07 DIAGNOSIS — Z95820 Peripheral vascular angioplasty status with implants and grafts: Secondary | ICD-10-CM

## 2018-08-07 DIAGNOSIS — N183 Chronic kidney disease, stage 3 (moderate): Secondary | ICD-10-CM | POA: Insufficient documentation

## 2018-08-07 DIAGNOSIS — G4733 Obstructive sleep apnea (adult) (pediatric): Secondary | ICD-10-CM | POA: Insufficient documentation

## 2018-08-07 DIAGNOSIS — I129 Hypertensive chronic kidney disease with stage 1 through stage 4 chronic kidney disease, or unspecified chronic kidney disease: Secondary | ICD-10-CM | POA: Diagnosis not present

## 2018-08-07 DIAGNOSIS — Z8249 Family history of ischemic heart disease and other diseases of the circulatory system: Secondary | ICD-10-CM | POA: Insufficient documentation

## 2018-08-07 DIAGNOSIS — Z955 Presence of coronary angioplasty implant and graft: Secondary | ICD-10-CM | POA: Insufficient documentation

## 2018-08-07 DIAGNOSIS — Z794 Long term (current) use of insulin: Secondary | ICD-10-CM | POA: Diagnosis not present

## 2018-08-07 DIAGNOSIS — IMO0002 Reserved for concepts with insufficient information to code with codable children: Secondary | ICD-10-CM | POA: Diagnosis present

## 2018-08-07 DIAGNOSIS — I2510 Atherosclerotic heart disease of native coronary artery without angina pectoris: Secondary | ICD-10-CM

## 2018-08-07 DIAGNOSIS — I25118 Atherosclerotic heart disease of native coronary artery with other forms of angina pectoris: Secondary | ICD-10-CM

## 2018-08-07 DIAGNOSIS — E669 Obesity, unspecified: Secondary | ICD-10-CM | POA: Diagnosis present

## 2018-08-07 DIAGNOSIS — E785 Hyperlipidemia, unspecified: Secondary | ICD-10-CM | POA: Diagnosis present

## 2018-08-07 DIAGNOSIS — E119 Type 2 diabetes mellitus without complications: Secondary | ICD-10-CM

## 2018-08-07 DIAGNOSIS — E079 Disorder of thyroid, unspecified: Secondary | ICD-10-CM | POA: Diagnosis not present

## 2018-08-07 DIAGNOSIS — E1165 Type 2 diabetes mellitus with hyperglycemia: Secondary | ICD-10-CM | POA: Diagnosis present

## 2018-08-07 DIAGNOSIS — E1122 Type 2 diabetes mellitus with diabetic chronic kidney disease: Secondary | ICD-10-CM | POA: Diagnosis not present

## 2018-08-07 DIAGNOSIS — I252 Old myocardial infarction: Secondary | ICD-10-CM | POA: Diagnosis not present

## 2018-08-07 HISTORY — PX: RIGHT/LEFT HEART CATH AND CORONARY ANGIOGRAPHY: CATH118266

## 2018-08-07 HISTORY — PX: CORONARY STENT INTERVENTION: CATH118234

## 2018-08-07 LAB — POCT I-STAT 7, (LYTES, BLD GAS, ICA,H+H)
Acid-Base Excess: 2 mmol/L (ref 0.0–2.0)
Bicarbonate: 27.9 mmol/L (ref 20.0–28.0)
Calcium, Ion: 1.23 mmol/L (ref 1.15–1.40)
HCT: 34 % — ABNORMAL LOW (ref 39.0–52.0)
Hemoglobin: 11.6 g/dL — ABNORMAL LOW (ref 13.0–17.0)
O2 Saturation: 99 %
Potassium: 3.3 mmol/L — ABNORMAL LOW (ref 3.5–5.1)
Sodium: 141 mmol/L (ref 135–145)
TCO2: 29 mmol/L (ref 22–32)
pCO2 arterial: 48.8 mmHg — ABNORMAL HIGH (ref 32.0–48.0)
pH, Arterial: 7.365 (ref 7.350–7.450)
pO2, Arterial: 164 mmHg — ABNORMAL HIGH (ref 83.0–108.0)

## 2018-08-07 LAB — BASIC METABOLIC PANEL
Anion gap: 13 (ref 5–15)
BUN: 25 mg/dL — ABNORMAL HIGH (ref 8–23)
CO2: 25 mmol/L (ref 22–32)
Calcium: 9.3 mg/dL (ref 8.9–10.3)
Chloride: 102 mmol/L (ref 98–111)
Creatinine, Ser: 1.41 mg/dL — ABNORMAL HIGH (ref 0.61–1.24)
GFR calc Af Amer: 57 mL/min — ABNORMAL LOW (ref >60)
GFR calc non Af Amer: 49 mL/min — ABNORMAL LOW (ref >60)
Glucose, Bld: 204 mg/dL — ABNORMAL HIGH (ref 70–99)
Potassium: 3.3 mmol/L — ABNORMAL LOW (ref 3.5–5.1)
Sodium: 140 mmol/L (ref 135–145)

## 2018-08-07 LAB — POCT I-STAT EG7
Acid-Base Excess: 1 mmol/L (ref 0.0–2.0)
Acid-Base Excess: 2 mmol/L (ref 0.0–2.0)
Bicarbonate: 27.4 mmol/L (ref 20.0–28.0)
Bicarbonate: 28.4 mmol/L — ABNORMAL HIGH (ref 20.0–28.0)
Calcium, Ion: 1.25 mmol/L (ref 1.15–1.40)
Calcium, Ion: 1.24 mmol/L (ref 1.15–1.40)
HCT: 35 % — ABNORMAL LOW (ref 39.0–52.0)
HCT: 34 % — ABNORMAL LOW (ref 39.0–52.0)
Hemoglobin: 11.9 g/dL — ABNORMAL LOW (ref 13.0–17.0)
Hemoglobin: 11.6 g/dL — ABNORMAL LOW (ref 13.0–17.0)
O2 Saturation: 74 %
O2 Saturation: 77 %
Potassium: 3.3 mmol/L — ABNORMAL LOW (ref 3.5–5.1)
Potassium: 3.2 mmol/L — ABNORMAL LOW (ref 3.5–5.1)
Sodium: 141 mmol/L (ref 135–145)
Sodium: 142 mmol/L (ref 135–145)
TCO2: 29 mmol/L (ref 22–32)
TCO2: 30 mmol/L (ref 22–32)
pCO2, Ven: 51 mmHg (ref 44.0–60.0)
pCO2, Ven: 53 mmHg (ref 44.0–60.0)
pH, Ven: 7.337 (ref 7.250–7.430)
pH, Ven: 7.337 (ref 7.250–7.430)
pO2, Ven: 42 mmHg (ref 32.0–45.0)
pO2, Ven: 45 mmHg (ref 32.0–45.0)

## 2018-08-07 LAB — GLUCOSE, CAPILLARY
Glucose-Capillary: 190 mg/dL — ABNORMAL HIGH (ref 70–99)
Glucose-Capillary: 189 mg/dL — ABNORMAL HIGH (ref 70–99)

## 2018-08-07 LAB — POCT ACTIVATED CLOTTING TIME
Activated Clotting Time: 263 seconds
Activated Clotting Time: 279 seconds

## 2018-08-07 SURGERY — RIGHT/LEFT HEART CATH AND CORONARY ANGIOGRAPHY
Anesthesia: LOCAL

## 2018-08-07 MED ORDER — HEPARIN SODIUM (PORCINE) 1000 UNIT/ML IJ SOLN
INTRAMUSCULAR | Status: AC
  Filled 2018-08-07: qty 2

## 2018-08-07 MED ORDER — FENTANYL CITRATE (PF) 100 MCG/2ML IJ SOLN
INTRAMUSCULAR | Status: AC
  Filled 2018-08-07: qty 2

## 2018-08-07 MED ORDER — FUROSEMIDE 80 MG PO TABS: 80.0000 mg | ORAL_TABLET | ORAL | Status: DC

## 2018-08-07 MED ORDER — VITAMIN D3 75 MCG (3000 UT) PO TABS: 3000.0000 [IU] | ORAL_TABLET | Freq: Every day | ORAL | Status: DC

## 2018-08-07 MED ORDER — POTASSIUM CHLORIDE CRYS ER 10 MEQ PO TBCR
EXTENDED_RELEASE_TABLET | ORAL | Status: DC | PRN
  Administered 2018-08-07: 40 meq via ORAL

## 2018-08-07 MED ORDER — SODIUM CHLORIDE 0.9 % WEIGHT BASED INFUSION: 1.0000 mL/kg/h | INTRAVENOUS | Status: DC

## 2018-08-07 MED ORDER — CLOPIDOGREL BISULFATE 75 MG PO TABS: 75.0000 mg | ORAL_TABLET | Freq: Every day | ORAL | Status: DC

## 2018-08-07 MED ORDER — CLOPIDOGREL BISULFATE 75 MG PO TABS: 75.0000 mg | ORAL_TABLET | ORAL | Status: DC

## 2018-08-07 MED ORDER — NITROGLYCERIN 1 MG/10 ML FOR IR/CATH LAB
INTRA_ARTERIAL | Status: DC | PRN
  Administered 2018-08-07: 200 ug via INTRACORONARY
  Administered 2018-08-07: 400 ug via INTRA_ARTERIAL

## 2018-08-07 MED ORDER — PREDNISONE 10 MG PO TABS: 10.0000 mg | ORAL_TABLET | Freq: Every evening | ORAL | Status: DC

## 2018-08-07 MED ORDER — PANTOPRAZOLE SODIUM 40 MG PO TBEC
40.0000 mg | DELAYED_RELEASE_TABLET | Freq: Every day | ORAL | Status: DC
  Filled 2018-08-07: qty 1

## 2018-08-07 MED ORDER — HEPARIN (PORCINE) IN NACL 1000-0.9 UT/500ML-% IV SOLN
INTRAVENOUS | Status: DC | PRN
  Administered 2018-08-07 (×2): 500 mL

## 2018-08-07 MED ORDER — ALBUTEROL SULFATE (2.5 MG/3ML) 0.083% IN NEBU: 2.5000 mg | INHALATION_SOLUTION | Freq: Four times a day (QID) | RESPIRATORY_TRACT | Status: DC | PRN

## 2018-08-07 MED ORDER — SODIUM CHLORIDE 0.9% FLUSH: 3.0000 mL | Freq: Two times a day (BID) | INTRAVENOUS | Status: DC

## 2018-08-07 MED ORDER — ACETAMINOPHEN 500 MG PO TABS: 500.0000 mg | ORAL_TABLET | Freq: Every day | ORAL | Status: DC | PRN

## 2018-08-07 MED ORDER — ASPIRIN 81 MG PO CHEW: 81.0000 mg | CHEWABLE_TABLET | ORAL | Status: DC

## 2018-08-07 MED ORDER — ASPIRIN EC 81 MG PO TBEC: 81.0000 mg | DELAYED_RELEASE_TABLET | Freq: Every evening | ORAL | Status: DC

## 2018-08-07 MED ORDER — MIDAZOLAM HCL 2 MG/2ML IJ SOLN
INTRAMUSCULAR | Status: DC | PRN
  Administered 2018-08-07: 2 mg via INTRAVENOUS

## 2018-08-07 MED ORDER — IOHEXOL 350 MG/ML SOLN
INTRAVENOUS | Status: DC | PRN
  Administered 2018-08-07: 100 mL via INTRACARDIAC

## 2018-08-07 MED ORDER — LEVOTHYROXINE SODIUM 50 MCG PO TABS
50.0000 ug | ORAL_TABLET | Freq: Every day | ORAL | Status: DC
  Filled 2018-08-07: qty 1

## 2018-08-07 MED ORDER — POTASSIUM CHLORIDE CRYS ER 20 MEQ PO TBCR: 20.0000 meq | EXTENDED_RELEASE_TABLET | Freq: Two times a day (BID) | ORAL | Status: DC

## 2018-08-07 MED ORDER — SODIUM CHLORIDE 0.9 % WEIGHT BASED INFUSION
3.0000 mL/kg/h | INTRAVENOUS | Status: DC
  Administered 2018-08-07: 3 mL/kg/h via INTRAVENOUS

## 2018-08-07 MED ORDER — CLOPIDOGREL BISULFATE 75 MG PO TABS: 75.0000 mg | ORAL_TABLET | Freq: Every evening | ORAL | Status: DC

## 2018-08-07 MED ORDER — FERROUS SULFATE 325 (65 FE) MG PO TBEC: 325.0000 mg | DELAYED_RELEASE_TABLET | Freq: Three times a day (TID) | ORAL | Status: DC

## 2018-08-07 MED ORDER — POTASSIUM CHLORIDE CRYS ER 20 MEQ PO TBCR
EXTENDED_RELEASE_TABLET | ORAL | Status: AC
  Filled 2018-08-07: qty 2

## 2018-08-07 MED ORDER — LIDOCAINE HCL (PF) 1 % IJ SOLN
INTRAMUSCULAR | Status: DC | PRN
  Administered 2018-08-07 (×2): 2 mL

## 2018-08-07 MED ORDER — SODIUM CHLORIDE 0.9 % IV SOLN: 250.0000 mL | INTRAVENOUS | Status: DC | PRN

## 2018-08-07 MED ORDER — LIDOCAINE HCL (PF) 1 % IJ SOLN
INTRAMUSCULAR | Status: AC
  Filled 2018-08-07: qty 30

## 2018-08-07 MED ORDER — HEPARIN SODIUM (PORCINE) 1000 UNIT/ML IJ SOLN
INTRAMUSCULAR | Status: DC | PRN
  Administered 2018-08-07: 6000 [IU] via INTRAVENOUS
  Administered 2018-08-07: 5000 [IU] via INTRAVENOUS
  Administered 2018-08-07: 3000 [IU] via INTRAVENOUS

## 2018-08-07 MED ORDER — POTASSIUM CHLORIDE CRYS ER 20 MEQ PO TBCR
40.0000 meq | EXTENDED_RELEASE_TABLET | ORAL | Status: DC
  Filled 2018-08-07: qty 2

## 2018-08-07 MED ORDER — SALINE SPRAY 0.65 % NA SOLN: 1.0000 | NASAL | Status: DC | PRN

## 2018-08-07 MED ORDER — ACETAMINOPHEN 325 MG PO TABS: 650.0000 mg | ORAL_TABLET | ORAL | Status: DC | PRN

## 2018-08-07 MED ORDER — ONDANSETRON HCL 4 MG/2ML IJ SOLN: 4.0000 mg | Freq: Four times a day (QID) | INTRAMUSCULAR | Status: DC | PRN

## 2018-08-07 MED ORDER — LOSARTAN POTASSIUM 50 MG PO TABS
100.0000 mg | ORAL_TABLET | Freq: Every day | ORAL | Status: DC
  Filled 2018-08-07: qty 2

## 2018-08-07 MED ORDER — SODIUM CHLORIDE 0.9 % IV SOLN: INTRAVENOUS | Status: DC

## 2018-08-07 MED ORDER — MIDAZOLAM HCL 2 MG/2ML IJ SOLN
INTRAMUSCULAR | Status: AC
  Filled 2018-08-07: qty 2

## 2018-08-07 MED ORDER — HYDRALAZINE HCL 20 MG/ML IJ SOLN: 10.0000 mg | INTRAMUSCULAR | Status: DC | PRN

## 2018-08-07 MED ORDER — GLIPIZIDE 10 MG PO TABS: 10.0000 mg | ORAL_TABLET | Freq: Every evening | ORAL | Status: DC

## 2018-08-07 MED ORDER — LABETALOL HCL 5 MG/ML IV SOLN: 10.0000 mg | INTRAVENOUS | Status: DC | PRN

## 2018-08-07 MED ORDER — HEPARIN (PORCINE) IN NACL 1000-0.9 UT/500ML-% IV SOLN
INTRAVENOUS | Status: AC
  Filled 2018-08-07: qty 1500

## 2018-08-07 MED ORDER — SODIUM CHLORIDE 0.9% FLUSH: 3.0000 mL | INTRAVENOUS | Status: DC | PRN

## 2018-08-07 MED ORDER — FENTANYL CITRATE (PF) 100 MCG/2ML IJ SOLN
INTRAMUSCULAR | Status: DC | PRN
  Administered 2018-08-07: 25 ug via INTRAVENOUS

## 2018-08-07 MED ORDER — NITROGLYCERIN 1 MG/10 ML FOR IR/CATH LAB
INTRA_ARTERIAL | Status: AC
  Filled 2018-08-07: qty 10

## 2018-08-07 MED ORDER — ALBUTEROL SULFATE 108 (90 BASE) MCG/ACT IN AEPB: 2.0000 | INHALATION_SPRAY | Freq: Four times a day (QID) | RESPIRATORY_TRACT | Status: DC | PRN

## 2018-08-07 MED ORDER — VERAPAMIL HCL 2.5 MG/ML IV SOLN
INTRAVENOUS | Status: DC | PRN
  Administered 2018-08-07: 10:00:00 10 mL via INTRA_ARTERIAL

## 2018-08-07 MED ORDER — INSULIN ASPART PROT & ASPART (70-30 MIX) 100 UNIT/ML ~~LOC~~ SUSP: 40.0000 [IU] | Freq: Three times a day (TID) | SUBCUTANEOUS | Status: DC | PRN

## 2018-08-07 MED ORDER — VERAPAMIL HCL 2.5 MG/ML IV SOLN
INTRAVENOUS | Status: AC
  Filled 2018-08-07: qty 2

## 2018-08-07 MED ORDER — MONTELUKAST SODIUM 10 MG PO TABS: 10.0000 mg | ORAL_TABLET | Freq: Every day | ORAL | Status: DC | PRN

## 2018-08-07 MED ORDER — PANTOPRAZOLE SODIUM 40 MG PO TBEC: 40.0000 mg | DELAYED_RELEASE_TABLET | Freq: Every day | ORAL | Status: DC

## 2018-08-07 MED ORDER — ASPIRIN 81 MG PO CHEW: 81.0000 mg | CHEWABLE_TABLET | Freq: Every day | ORAL | Status: DC

## 2018-08-07 SURGICAL SUPPLY — 19 items
BALLN SAPPHIRE 2.0X20 (BALLOONS) ×2
BALLOON SAPPHIRE 2.0X20 (BALLOONS) ×1 IMPLANT
CATH 5FR JL3.5 JR4 ANG PIG MP (CATHETERS) ×2 IMPLANT
CATH BALLN WEDGE 5F 110CM (CATHETERS) ×2 IMPLANT
CATH LAUNCHER 6FR JR4 (CATHETERS) ×2 IMPLANT
DEVICE RAD COMP TR BAND LRG (VASCULAR PRODUCTS) ×2 IMPLANT
GLIDESHEATH SLEND SS 6F .021 (SHEATH) ×2 IMPLANT
GUIDEWIRE INQWIRE 1.5J.035X260 (WIRE) ×1 IMPLANT
INQWIRE 1.5J .035X260CM (WIRE) ×2
KIT ENCORE 26 ADVANTAGE (KITS) ×2 IMPLANT
KIT HEART LEFT (KITS) ×2 IMPLANT
KIT HEMO VALVE WATCHDOG (MISCELLANEOUS) ×2 IMPLANT
PACK CARDIAC CATHETERIZATION (CUSTOM PROCEDURE TRAY) ×2 IMPLANT
SHEATH GLIDE SLENDER 4/5FR (SHEATH) ×2 IMPLANT
STENT RESOLUTE ONYX 2.0X30 (Permanent Stent) ×2 IMPLANT
STENT RESOLUTE ONYX 2.25X8 (Permanent Stent) ×2 IMPLANT
TRANSDUCER W/STOPCOCK (MISCELLANEOUS) ×2 IMPLANT
TUBING CIL FLEX 10 FLL-RA (TUBING) ×2 IMPLANT
WIRE ASAHI PROWATER 180CM (WIRE) ×2 IMPLANT

## 2018-08-07 NOTE — Interval H&P Note (Signed)
Cath Lab Visit (complete for each Cath Lab visit)  Clinical Evaluation Leading to the Procedure:   ACS: No.  Non-ACS:    Anginal Classification: CCS III  Anti-ischemic medical therapy: Minimal Therapy (1 class of medications)  Non-Invasive Test Results: No non-invasive testing performed  Prior CABG: No previous CABG      History and Physical Interval Note:  08/07/2018 9:25 AM  [name]  has presented today for surgery, with the diagnosis of CAD.  The various methods of treatment have been discussed with the patient and family. After consideration of risks, benefits and other options for treatment, the patient has consented to  Procedure(s): RIGHT/LEFT HEART CATH AND CORONARY ANGIOGRAPHY (N/A) as a surgical intervention.  The patient's history has been reviewed, patient examined, no change in status, stable for surgery.  I have reviewed the patient's chart and labs.  Questions were answered to the patient's satisfaction.     Larae Grooms

## 2018-08-07 NOTE — Research (Signed)
[place] Informed Consent   Subject Name: [name]  Subject met inclusion and exclusion criteria.  The informed consent form, study requirements and expectations were reviewed with the subject and questions and concerns were addressed prior to the signing of the consent form.  The subject verbalized understanding of the trail requirements.  The subject agreed to participate in the [facility] trial and signed the informed consent.  The informed consent was obtained prior to performance of any protocol-specific procedures for the subject.  A copy of the signed informed consent was given to the subject and a copy was placed in the subject's medical record.  [name] 08/07/2018, 8:30 AM

## 2018-08-07 NOTE — Progress Notes (Signed)
Order for potassium noted. Ordered from pharmacy, not here yet . Cath lab called and informed not given yet

## 2018-08-07 NOTE — Progress Notes (Signed)
[id number] Education completed with pt who voiced understanding. Reinforced importance of plavix with stent. Has been on plavix since last stent. Reviewed NTG use, carb counting and heart healthy diets, ex ed and CRP 2. Pt has attended [place] CRP 2 before. Wants to attend [place]. Has smart phone and email for virtual program. Graylon Good RN BSN 08/07/2018 1:58 PM

## 2018-08-07 NOTE — Discharge Summary (Addendum)
Discharge Summary    Patient ID: [name] MRN: [id number]; DOB: [birth year]  Admit date: 08/07/2018 Discharge date: 08/07/2018  Primary Care Provider: Shirline Frees, MD  Primary Cardiologist: Candee Furbish, MD   Discharge Diagnoses    Principal Problem:   Coronary artery disease Active Problems:   Diabetes mellitus without complication ([place])   Obesity   Uncontrolled diabetes mellitus ([place])   Hyperlipidemia  Allergies Allergies  Allergen Reactions  . Hydrocortisone Nausea Only  . Metolazone     Drained, no energy  . Statins Other (See Comments)    Pt says they make him "mean"   Diagnostic Studies/Procedures    Right and left cardiac catheterization 08/07/2018:   Prox RCA to Mid RCA lesion is 25% stenosed.  Ost RPDA lesion is 80% stenosed. The PDA is a long vessel that feeds the apex.  A drug-eluting stent was successfully placed using a STENT RESOLUTE ONYX 2.25X8.  Post intervention, there is a 0% residual stenosis.  Ost RPDA to RPDA lesion is 70% stenosed.  A drug-eluting stent was successfully placed using a STENT RESOLUTE ONYX 2.0X30.  Post intervention, there is a 0% residual stenosis.  Previously placed Post Atrio stent (unknown type) is widely patent.  Prox LAD stent has a focal 40% restenosis.  LV end diastolic pressure is normal.  There is no aortic valve stenosis.  Mid Cx lesion is 50% stenosed.  Ao sat: 99%, PA sat 75%, mean PA 18 mm Hg; mean PCWP 12 mm Hg; CO 6.9 L/min; CI 3.1   Continue dual antiplatelet therapy and aggressive secondary prevention.  Hydrate post procedure with plan for same day discharge.   History of Present Illness     [name] is a 73 y.o. male with coronary artery disease, obesity, diabetes with chronic kidney disease stage III and adrenal insufficiency who has been having ongoing lower extremity edema, chest [name], shortness of breath.   He was seen via virtual visit per Dr. Marlou Porch on 08/01/2018 for shortness of breath  and chest [name] for which he stated he states that he "feels like one of his stents may be closed". He had previously been tried on metolazone once a week and this did not help him urinate anymore and therefore this was stopped. Given his more current symptoms, a cardiac catheterization was discussed and planned for 08/07/2018.  His Lasix and losartan was held 1 day prior to procedure given his last creatinine was 1.5.  His metolazone was stopped altogether as this did not help his diuresis  Hospital Course    Cardiac catheterization revealed an ostial RPDA lesion at 80% stenosis in which a DES was successfully placed. Ost RPDA to RPDA with a 70% stenosis with a DES was placed. A previously placed Post Atrio stent (unknown type) is widely patent. pLAD stent has a focal 40% restenosis. LV end diastolic pressure is normal. There is no aortic valve stenosis. Mid Cx lesion is 50% stenosed. Ao sat: 99%, PA sat 75%, mean PA 18 mm Hg; mean PCWP 12 mm Hg; CO 6.9 L/min; CI 3.1. Recommendations are for DAPT with ASA and Plavix  As well as aggressive secondary prevention. BP moderately elevated after procedure, however home losartan was held prior to procedure. Will restart pre-procedure medications and follow closely in the OP setting.   K+ noted to be 3.3, creatinine 1.41 on 08/07/2018 with a previous creatinine of 1.53.  He will need repeat be met at follow-up visit.    Consultants: None  The patient was seen and examined by Dr. Illene Bolus who feels that he is stable and ready for discharge home 08/07/2018.  He has a follow-up visit already scheduled.  He is ambulated with cardiac rehabilitation without complication.  Cath site is stable. _____________  Discharge Vitals Blood pressure (!) 159/74, pulse 61, temperature 97.8 F (36.6 C), temperature source Oral, resp. rate 13, height 5' 10"  (1.778 m), weight 106.1 kg, SpO2 98 %.  Filed Weights   08/07/18 0712  Weight: 106.1 kg   Labs & Radiologic Studies     CBC Recent Labs    08/07/18 1000 08/07/18 1003  HGB 11.9* 11.6*  HCT 35.0* 50.9*   Basic Metabolic Panel Recent Labs    08/07/18 0704  08/07/18 1000 08/07/18 1003  NA 140   < > 141 142  K 3.3*   < > 3.3* 3.2*  CL 102  --   --   --   CO2 25  --   --   --   GLUCOSE 204*  --   --   --   BUN 25*  --   --   --   CREATININE 1.41*  --   --   --   CALCIUM 9.3  --   --   --    < > = values in this interval not displayed.  _____________   Disposition   Pt is being discharged home today in good condition.  Follow-up Plans & Appointments    Follow-up Information    Craig Pain, MD Follow up on 08/24/2018.   Specialty:  Cardiology Why:  Your follow-up appointment will be a virtual visit with Dr. Marlou Porch on 08/24/2018 at 2:40 PM Contact information: [address] [place] [address] [phone]          Discharge Instructions    Amb Referral to Cardiac Rehabilitation   Complete by:  As directed    Diagnosis:  Coronary Stents   After initial evaluation and assessments completed: Virtual Based Care may be provided alone or in conjunction with Phase 2 Cardiac Rehab based on patient barriers.:  Yes   Call MD for:  difficulty breathing, headache or visual disturbances   Complete by:  As directed    Call MD for:  extreme fatigue   Complete by:  As directed    Call MD for:  hives   Complete by:  As directed    Call MD for:  persistant dizziness or light-headedness   Complete by:  As directed    Call MD for:  persistant nausea and vomiting   Complete by:  As directed    Call MD for:  redness, tenderness, or signs of infection ([name], swelling, redness, odor or green/yellow discharge around incision site)   Complete by:  As directed    Call MD for:  severe uncontrolled [name]   Complete by:  As directed    Call MD for:  temperature >100.4   Complete by:  As directed    Diet - low sodium heart healthy   Complete by:  As directed    Discharge instructions    Complete by:  As directed    PLEASE DO NOT [place]!!!!! Also keep a log of you blood pressures and bring back to your follow up appt. Please call the office with any questions.   Patients taking blood thinners should generally stay away from medicines like ibuprofen, Advil, Motrin, naproxen, and Aleve due to  risk of stomach bleeding. You may take Tylenol as directed or talk to your primary doctor about alternatives.  Some studies suggest Prilosec/Omeprazole interacts with Plavix. If you have reflux symptoms, please use Protonix for less chance of interaction.   No driving for 3 days. No lifting over 5 lbs for 1 week. No sexual activity for 1 week. Keep procedure site clean & dry. If you notice increased [name], swelling, bleeding or pus, call/return!  You may shower, but no soaking baths/hot tubs/pools for 1 week.   Increase activity slowly   Complete by:  As directed       Discharge Medications   Allergies as of 08/07/2018      Reactions   Hydrocortisone Nausea Only   Metolazone    Drained, no energy   Statins Other (See Comments)   Pt says they make him "mean"      Medication List    TAKE these medications   acetaminophen 500 MG tablet Commonly known as:  TYLENOL Take 500-1,000 mg by mouth daily as needed for moderate [name] or headache.   Albuterol Sulfate 108 (90 Base) MCG/ACT Aepb Commonly known as:  ProAir RespiClick Inhale 2 puffs into the lungs every 6 (six) hours as needed. What changed:  reasons to take this   albuterol (2.5 MG/3ML) 0.083% nebulizer solution Commonly known as:  PROVENTIL Take 3 mLs (2.5 mg total) by nebulization every 6 (six) hours as needed for wheezing or shortness of breath. Dx Code D86.0 What changed:  Another medication with the same name was changed. Make sure you understand how and when to take each.   aspirin EC 81 MG tablet Take 81 mg by mouth every evening. Notes to patient:  08/08/2018   clopidogrel 75 MG tablet  Commonly known as:  PLAVIX Take 1 tablet (75 mg total) by mouth daily. What changed:  when to take this Notes to patient:  08/08/2018   CoQ-10 400 MG Caps Take 400 mg by mouth every evening.   ferrous sulfate 325 (65 FE) MG EC tablet Take 1 tablet (325 mg total) by mouth 3 (three) times daily with meals.   fish oil-omega-3 fatty acids 1000 MG capsule Take 1 g by mouth daily.   furosemide 80 MG tablet Commonly known as:  LASIX Take 80 mg by mouth See admin instructions. Take 80 mg twice daily, may take a third 80 mg dose as needed for swelling   glipiZIDE 10 MG tablet Commonly known as:  GLUCOTROL Take 10 mg by mouth every evening.   insulin aspart protamine- aspart (70-30) 100 UNIT/ML injection Commonly known as:  NOVOLOG MIX 70/30 Inject 40-45 Units into the skin 3 (three) times daily as needed (CBG >100).   levothyroxine 50 MCG tablet Commonly known as:  SYNTHROID Take 50 mcg by mouth daily.   losartan 100 MG tablet Commonly known as:  COZAAR Take 100 mg by mouth daily.   montelukast 10 MG tablet Commonly known as:  SINGULAIR Take 10 mg by mouth daily as needed (allergies).   pantoprazole 40 MG tablet Commonly known as:  PROTONIX Take 40 mg by mouth daily.   potassium chloride 10 MEQ tablet Commonly known as:  Klor-Con M10 Take 2 tablets (20 mEq total) by mouth 2 (two) times daily.   predniSONE 10 MG tablet Commonly known as:  DELTASONE Take 1 tablet (10 mg total) by mouth daily with breakfast. T What changed:    when to take this  additional instructions   RABEprazole 20 MG  tablet Commonly known as:  ACIPHEX Take 1 tablet (20 mg total) by mouth 2 (two) times daily as needed. What changed:    when to take this  reasons to take this   sodium chloride 0.65 % Soln nasal spray Commonly known as:  OCEAN Place 1 spray into both nostrils as needed for congestion.   Vitamin D (Ergocalciferol) 1.25 MG (50000 UT) Caps capsule Commonly known as:  DRISDOL  Take 50,000 Units by mouth every Sunday.   Vitamin D3 75 MCG (3000 UT) Tabs Take 3,000 Units by mouth daily.        Acute coronary syndrome (MI, NSTEMI, STEMI, etc) this admission?: Yes.     AHA/ACC Clinical Performance & Quality Measures: 1. Aspirin prescribed? - Yes 2. ADP Receptor Inhibitor (Plavix/Clopidogrel, Brilinta/Ticagrelor or Effient/Prasugrel) prescribed (includes medically managed patients)? - Yes 3. Beta Blocker prescribed? - Yes 4. High Intensity Statin (Lipitor 40-44m or Crestor 20-483m prescribed? - Yes 5. EF assessed during THIS hospitalization? - Yes 6. For EF <40%, was ACEI/ARB prescribed? - Not Applicable (EF >/= 4089%7. For EF <40%, Aldosterone Antagonist (Spironolactone or Eplerenone) prescribed? - Not Applicable (EF >/= 4033%8. Cardiac Rehab Phase II ordered (Included Medically managed Patients)? - Yes     Outstanding Labs/Studies   BMET   Duration of Discharge Encounter   Greater than 30 minutes including physician time.  Signed, [name] 08/07/2018, 5:08 PM   I have examined the patient and reviewed assessment and plan and discussed with patient.  Agree with above as stated.  Patient with PCI to PDA. MOderate LAD stent restenosis  Aggressive hydration post procedure.  Resume normal BP meds and continue DAPT with clopidogrel.   Same day PCI.  F/u with Dr. SkMarlou Porch  [name]

## 2018-08-07 NOTE — Discharge Instructions (Signed)
Take home meds tonight as prescribed  Radial Site Care  This sheet gives you information about how to care for yourself after your procedure. Your health care provider may also give you more specific instructions. If you have problems or questions, contact your health care provider. What can I expect after the procedure? After the procedure, it is common to have:  Bruising and tenderness at the catheter insertion area. Follow these instructions at home: Medicines  Take over-the-counter and prescription medicines only as told by your health care provider. Insertion site care  Follow instructions from your health care provider about how to take care of your insertion site. Make sure you: ? Wash your hands with soap and water before you change your bandage (dressing). If soap and water are not available, use hand sanitizer. ? Change your dressing as told by your health care provider. ? Leave stitches (sutures), skin glue, or adhesive strips in place. These skin closures may need to stay in place for 2 weeks or longer. If adhesive strip edges start to loosen and curl up, you may trim the loose edges. Do not remove adhesive strips completely unless your health care provider tells you to do that.  Check your insertion site every day for signs of infection. Check for: ? Redness, swelling, or pain. ? Fluid or blood. ? Pus or a bad smell. ? Warmth.  Do not take baths, swim, or use a hot tub until your health care provider approves.  You may shower 24-48 hours after the procedure, or as directed by your health care provider. ? Remove the dressing and gently wash the site with plain soap and water. ? Pat the area dry with a clean towel. ? Do not rub the site. That could cause bleeding.  Do not apply powder or lotion to the site. Activity   For 24 hours after the procedure, or as directed by your health care provider: ? Do not flex or bend the affected arm. ? Do not push or pull heavy  objects with the affected arm. ? Do not drive yourself home from the hospital or clinic. You may drive 24 hours after the procedure unless your health care provider tells you not to. ? Do not operate machinery or power tools.  Do not lift anything that is heavier than 10 lb (4.5 kg), or the limit that you are told, until your health care provider says that it is safe.  Ask your health care provider when it is okay to: ? Return to work or school. ? Resume usual physical activities or sports. ? Resume sexual activity. General instructions  If the catheter site starts to bleed, raise your arm and put firm pressure on the site. If the bleeding does not stop, get help right away. This is a medical emergency.  If you went home on the same day as your procedure, a responsible adult should be with you for the first 24 hours after you arrive home.  Keep all follow-up visits as told by your health care provider. This is important. Contact a health care provider if:  You have a fever.  You have redness, swelling, or yellow drainage around your insertion site. Get help right away if:  You have unusual pain at the radial site.  The catheter insertion area swells very fast.  The insertion area is bleeding, and the bleeding does not stop when you hold steady pressure on the area.  Your arm or hand becomes pale, cool, tingly, or numb.  These symptoms may represent a serious problem that is an emergency. Do not wait to see if the symptoms will go away. Get medical help right away. Call your local emergency services (911 in the [place] Do not drive yourself to the hospital. Summary  After the procedure, it is common to have bruising and tenderness at the site.  Follow instructions from your health care provider about how to take care of your radial site wound. Check the wound every day for signs of infection.  Do not lift anything that is heavier than 10 lb (4.5 kg), or the limit that you are told,  until your health care provider says that it is safe. This information is not intended to replace advice given to you by your health care provider. Make sure you discuss any questions you have with your health care provider. Document Released: 03/26/2010 Document Revised: 03/29/2017 Document Reviewed: 03/29/2017 Elsevier Interactive Patient Education  2019 [employer].

## 2018-08-08 ENCOUNTER — Encounter (HOSPITAL_COMMUNITY): Payer: Self-pay | Admitting: Interventional Cardiology

## 2018-08-23 ENCOUNTER — Telehealth: Payer: Self-pay

## 2018-08-23 NOTE — Telephone Encounter (Signed)
YOUR CARDIOLOGY TEAM HAS ARRANGED FOR AN E-VISIT FOR YOUR APPOINTMENT - PLEASE REVIEW IMPORTANT INFORMATION BELOW SEVERAL DAYS PRIOR TO YOUR APPOINTMENT  Due to the recent COVID-19 pandemic, we are transitioning in-person office visits to tele-medicine visits in an effort to decrease unnecessary exposure to our patients, their families, and staff. These visits are billed to your insurance just like a normal visit is. We also encourage you to sign up for MyChart if you have not already done so. You will need a smartphone if possible. For patients that do not have this, we can still complete the visit using a regular telephone but do prefer a smartphone to enable video when possible. You may have a family member that lives with you that can help. If possible, we also ask that you have a blood pressure cuff and scale at home to measure your blood pressure, heart rate and weight prior to your scheduled appointment. Patients with clinical needs that need an in-person evaluation and testing will still be able to come to the office if absolutely necessary. If you have any questions, feel free to call our office.     YOUR PROVIDER WILL BE USING THE FOLLOWING PLATFORM TO COMPLETE YOUR VISIT: [email or web address]  . IF USING MYCHART - How to Download the MyChart App to Your SmartPhone   - If Apple, go to App Store and type in MyChart in the search bar and download the app. If Android, ask patient to go to Google Play Store and type in MyChart in the search bar and download the app. The app is free but as with any other app downloads, your phone may require you to verify saved payment information or Apple/Android password.  - You will need to then log into the app with your MyChart username and password, and select [facility] as your healthcare provider to link the account.  - When it is time for your visit, go to the MyChart app, find appointments, and click Begin Video Visit. Be sure to Select Allow for your device to  access the Microphone and Camera for your visit. You will then be connected, and your provider will be with you shortly.  **If you have any issues connecting or need assistance, please contact MyChart service desk [phone] ([phone])**  **If using a computer, in order to ensure the best quality for your visit, you will need to use either of the following Internet Browsers: Google Chrome or Microsoft Edge**  . IF USING DOXIMITY or [email or web address].ME - The staff will give you instructions on receiving your link to join the meeting the day of your visit.      2-3 DAYS BEFORE YOUR APPOINTMENT  You will receive a telephone call from one of our HeartCare team members - your caller ID may say "Unknown caller." If this is a video visit, we will walk you through how to get the video launched on your phone. We will remind you check your blood pressure, heart rate and weight prior to your scheduled appointment. If you have an Apple Watch or Kardia, please upload any pertinent ECG strips the day before or morning of your appointment to MyChart. Our staff will also make sure you have reviewed the consent and agree to move forward with your scheduled tele-health visit.     THE DAY OF YOUR APPOINTMENT  Approximately 15 minutes prior to your scheduled appointment, you will receive a telephone call from one of HeartCare team - your caller ID may say "Unknown caller."    Our staff will confirm medications, vital signs for the day and any symptoms you may be experiencing. Please have this information available prior to the time of visit start. It may also be helpful for you to have a pad of paper and pen handy for any instructions given during your visit. They will also walk you through joining the smartphone meeting if this is a video visit.    CONSENT FOR TELE-HEALTH VISIT - PLEASE REVIEW  I hereby voluntarily request, consent and authorize [facility] and its employed or contracted physicians, physician  assistants, nurse practitioners or other licensed health care professionals (the Practitioner), to provide me with telemedicine health care services (the "Services") as deemed necessary by the treating Practitioner. I acknowledge and consent to receive the Services by the Practitioner via telemedicine. I understand that the telemedicine visit will involve communicating with the Practitioner through live audiovisual communication technology and the disclosure of certain medical information by electronic transmission. I acknowledge that I have been given the opportunity to request an in-person assessment or other available alternative prior to the telemedicine visit and am voluntarily participating in the telemedicine visit.  I understand that I have the right to withhold or withdraw my consent to the use of telemedicine in the course of my care at any time, without affecting my right to future care or treatment, and that the Practitioner or I may terminate the telemedicine visit at any time. I understand that I have the right to inspect all information obtained and/or recorded in the course of the telemedicine visit and may receive copies of available information for a reasonable fee.  I understand that some of the potential risks of receiving the Services via telemedicine include:  . Delay or interruption in medical evaluation due to technological equipment failure or disruption; . Information transmitted may not be sufficient (e.g. poor resolution of images) to allow for appropriate medical decision making by the Practitioner; and/or  . In rare instances, security protocols could fail, causing a breach of personal health information.  Furthermore, I acknowledge that it is my responsibility to provide information about my medical history, conditions and care that is complete and accurate to the best of my ability. I acknowledge that Practitioner's advice, recommendations, and/or decision may be based on  factors not within their control, such as incomplete or inaccurate data provided by me or distortions of diagnostic images or specimens that may result from electronic transmissions. I understand that the practice of medicine is not an exact science and that Practitioner makes no warranties or guarantees regarding treatment outcomes. I acknowledge that I will receive a copy of this consent concurrently upon execution via email to the email address I last provided but may also request a printed copy by calling the office of [facility].    I understand that my insurance will be billed for this visit.   I have read or had this consent read to me. . I understand the contents of this consent, which adequately explains the benefits and risks of the Services being provided via telemedicine.  . I have been provided ample opportunity to ask questions regarding this consent and the Services and have had my questions answered to my satisfaction. . I give my informed consent for the services to be provided through the use of telemedicine in my medical care  By participating in this telemedicine visit I agree to the above.  

## 2018-08-24 ENCOUNTER — Other Ambulatory Visit: Payer: Self-pay

## 2018-08-24 ENCOUNTER — Telehealth (INDEPENDENT_AMBULATORY_CARE_PROVIDER_SITE_OTHER): Payer: Medicare HMO | Admitting: Cardiology

## 2018-08-24 ENCOUNTER — Encounter: Payer: Self-pay | Admitting: Cardiology

## 2018-08-24 VITALS — BP 129/79 | HR 87 | Ht 70.0 in | Wt 231.0 lb

## 2018-08-24 DIAGNOSIS — Z95820 Peripheral vascular angioplasty status with implants and grafts: Secondary | ICD-10-CM | POA: Diagnosis not present

## 2018-08-24 DIAGNOSIS — R079 Chest pain, unspecified: Secondary | ICD-10-CM

## 2018-08-24 DIAGNOSIS — I2510 Atherosclerotic heart disease of native coronary artery without angina pectoris: Secondary | ICD-10-CM

## 2018-08-24 DIAGNOSIS — E7800 Pure hypercholesterolemia, unspecified: Secondary | ICD-10-CM | POA: Diagnosis not present

## 2018-08-24 DIAGNOSIS — R0789 Other chest pain: Secondary | ICD-10-CM

## 2018-08-24 MED ORDER — ROSUVASTATIN CALCIUM 10 MG PO TABS: 10.0000 mg | ORAL_TABLET | Freq: Every day | ORAL | 3 refills | Status: DC

## 2018-08-24 NOTE — Patient Instructions (Signed)
Medication Instructions:  Please start Crestor 10 mg 1/2 tablet daily for 1 week then increase to 1 tablet daily. Continue all other medications as listed.  If you need a refill on your cardiac medications before your next appointment, please call your pharmacy.   Follow-Up: At [facility], you and your health needs are our priority.  As part of our continuing mission to provide you with exceptional heart care, we have created designated Provider Care Teams.  These Care Teams include your primary Cardiologist (physician) and Advanced Practice Providers (APPs -  Physician Assistants and Nurse Practitioners) who all work together to provide you with the care you need, when you need it. You will need a follow up appointment in 3 months with Kathyrn Drown, NP and Dr Marlou Porch in 6 months.  Please call our office 2 months in advance to schedule this appointment.  You may see Candee Furbish, MD or one of the following Advanced Practice Providers on your designated Care Team:   Truitt Merle, NP Cecilie Kicks, NP . Kathyrn Drown, NP  Thank you for choosing [facility]!!

## 2018-08-24 NOTE — Progress Notes (Signed)
Virtual Visit via Video Note   This visit type was conducted due to national recommendations for restrictions regarding the COVID-19 Pandemic (e.g. social distancing) in an effort to limit this patient's exposure and mitigate transmission in our community.  Due to his co-morbid illnesses, this patient is at least at moderate risk for complications without adequate follow up.  This format is felt to be most appropriate for this patient at this time.  All issues noted in this document were discussed and addressed.  A limited physical exam was performed with this format.  Please refer to the patient's chart for his consent to telehealth for [facility].   Date:  08/24/2018   ID:  [name], DOB 1945-07-25, MRN [id number]  Patient Location: Home Provider Location: Home  PCP:  Shirline Frees, MD  Cardiologist:  Candee Furbish, MD  Electrophysiologist:  None   Evaluation Performed:  Follow-Up Visit  Chief Complaint: Post cardiac catheterization follow-up  History of Present Illness:    [name] is a 73 y.o. male with coronary disease obesity diabetes CKD 3 adrenal insufficiency on chronic steroids who had chest pain shortness of breath underwent left and right heart catheterization on 08/07/2018.  Stents were placed to the distal right coronary artery PDA region.  Right heart cath thankfully was normal.  Overall he states that he is feeling better, he is not having to stop for instance when walking.  He is still feeling somewhat fatigued and tired, has to take a nap for instance.  Overall he is pleased with his progress.  Denies any fevers chills nausea vomiting syncope bleeding  The patient does not have symptoms concerning for COVID-19 infection (fever, chills, cough, or new shortness of breath).    Past Medical History:  Diagnosis Date  . Adrenal insufficiency ([place])   . Allergy   . Anemia   . Arthritis    feet   . CKD (chronic kidney disease), stage III ([place])   . Coronary artery  disease    has stents  . Coronary atherosclerosis of native coronary artery    Proximal LAD, posterior lateral stent widely patent-10/12/11  . Diabetes mellitus    insulin and pills  . Hearing loss    wears hearing aids  . Heart attack ([place]) 2010  . History of blood transfusion 06/30/2016   [facility] - 2 units transfused  . Hypertension   . OSA (obstructive sleep apnea)    uses VPAC sleep study 2 years done through [facility]. Dr. Marlou Porch arranged study  . Pneumonia    3-4 years ago  . Sarcoid   . Sleep apnea    uses CPAP nightly  . Thyroid disease   . Type 2 diabetes mellitus ([place])    Past Surgical History:  Procedure Laterality Date  . APPENDECTOMY    . CATARACT EXTRACTION  2011   bilat  . CHOLECYSTECTOMY  01/25/2011   Procedure: LAPAROSCOPIC CHOLECYSTECTOMY WITH INTRAOPERATIVE CHOLANGIOGRAM;  Surgeon: Judieth Keens, DO;  Location: [place];  Service: General;  Laterality: N/A;  . COLONOSCOPY     several  . CORONARY ANGIOPLASTY     most recent 11/2009  . CORONARY STENT INTERVENTION N/A 08/07/2018   Procedure: CORONARY STENT INTERVENTION;  Surgeon: Jettie Booze, MD;  Location: [place] CV LAB;  Service: Cardiovascular;  Laterality: N/A;  . CORONARY STENT PLACEMENT  2009   in LAD and side branch PTCA  . INTERCOSTAL NERVE BLOCK  2011, 06/2016   x2. lumbar spine  .  LEFT HEART CATHETERIZATION WITH CORONARY ANGIOGRAM Bilateral 10/12/2011   Procedure: LEFT HEART CATHETERIZATION WITH CORONARY ANGIOGRAM;  Surgeon: Candee Furbish, MD;  Location: [facility] CATH LAB;  Service: Cardiovascular;  Laterality: Bilateral;  . LEFT HEART CATHETERIZATION WITH CORONARY ANGIOGRAM N/A 04/01/2014   Procedure: LEFT HEART CATHETERIZATION WITH CORONARY ANGIOGRAM;  Surgeon: Candee Furbish, MD;  Location: [facility] CATH LAB;  Service: Cardiovascular;  Laterality: N/A;  . RIGHT/LEFT HEART CATH AND CORONARY ANGIOGRAPHY N/A 08/07/2018   Procedure: RIGHT/LEFT HEART CATH AND CORONARY ANGIOGRAPHY;  Surgeon: Jettie Booze, MD;  Location: [place] CV LAB;  Service: Cardiovascular;  Laterality: N/A;     Current Meds  Medication Sig  . acetaminophen (TYLENOL) 500 MG tablet Take 500-1,000 mg by mouth daily as needed for moderate pain or headache.   . albuterol (PROVENTIL) (2.5 MG/3ML) 0.083% nebulizer solution Take 3 mLs (2.5 mg total) by nebulization every 6 (six) hours as needed for wheezing or shortness of breath. Dx Code D86.0  . Albuterol Sulfate (PROAIR RESPICLICK) 881 (90 BASE) MCG/ACT AEPB Inhale 2 puffs into the lungs every 6 (six) hours as needed.  Marland Kitchen aspirin EC 81 MG tablet Take 81 mg by mouth every evening.   . Cholecalciferol (VITAMIN D3) 3000 UNITS TABS Take 3,000 Units by mouth daily.  . clopidogrel (PLAVIX) 75 MG tablet Take 1 tablet (75 mg total) by mouth daily.  . Coenzyme Q10 (COQ-10) 400 MG CAPS Take 400 mg by mouth every evening.  . fish oil-omega-3 fatty acids 1000 MG capsule Take 1 g by mouth daily.   . furosemide (LASIX) 80 MG tablet Take 80 mg by mouth See admin instructions. Take 80 mg twice daily, may take a third 80 mg dose as needed for swelling  . glipiZIDE (GLUCOTROL) 10 MG tablet Take 10 mg by mouth every evening.   . insulin aspart protamine-insulin aspart (NOVOLOG 70/30) (70-30) 100 UNIT/ML injection Inject 40-45 Units into the skin 3 (three) times daily as needed (CBG >100).   Marland Kitchen levothyroxine (SYNTHROID, LEVOTHROID) 50 MCG tablet Take 50 mcg by mouth daily.   Marland Kitchen losartan (COZAAR) 100 MG tablet Take 100 mg by mouth daily.  . montelukast (SINGULAIR) 10 MG tablet Take 10 mg by mouth daily as needed (allergies).   . pantoprazole (PROTONIX) 40 MG tablet Take 40 mg by mouth daily.  . potassium chloride (KLOR-CON M10) 10 MEQ tablet Take 2 tablets (20 mEq total) by mouth 2 (two) times daily.  . predniSONE (DELTASONE) 10 MG tablet Take 1 tablet (10 mg total) by mouth daily with breakfast. T  . RABEprazole (ACIPHEX) 20 MG tablet Take 1 tablet (20 mg total) by mouth 2 (two) times daily as  needed.  . sodium chloride (OCEAN) 0.65 % SOLN nasal spray Place 1 spray into both nostrils as needed for congestion.  . Vitamin D, Ergocalciferol, (DRISDOL) 50000 UNITS CAPS capsule Take 50,000 Units by mouth every Sunday.      Allergies:   Hydrocortisone, Metolazone, and Statins   Social History   Tobacco Use  . Smoking status: Never Smoker  . Smokeless tobacco: Never Used  Substance Use Topics  . Alcohol use: No  . Drug use: No     Family Hx: The patient's family history includes Anesthesia problems in his sister; Asthma in his sister; Diabetes in his mother; Heart attack in his father; Kidney cancer in his mother; Kidney disease in his mother. There is no history of Colon cancer, Colon polyps, Rectal cancer, or Stomach cancer.  ROS:  Please see the history of present illness.     All other systems reviewed and are negative.   Prior CV studies:   The following studies were reviewed today:  Cath 08/07/18:  Prox RCA to Mid RCA lesion is 25% stenosed.  Ost RPDA lesion is 80% stenosed. The PDA is a long vessel that feeds the apex.  A drug-eluting stent was successfully placed using a STENT RESOLUTE ONYX 2.25X8.  Post intervention, there is a 0% residual stenosis.  Ost RPDA to RPDA lesion is 70% stenosed.  A drug-eluting stent was successfully placed using a STENT RESOLUTE ONYX 2.0X30.  Post intervention, there is a 0% residual stenosis.  Previously placed Post Atrio stent (unknown type) is widely patent.  Prox LAD stent has a focal 40% restenosis.  LV end diastolic pressure is normal.  There is no aortic valve stenosis.  Mid Cx lesion is 50% stenosed.  Ao sat: 99%, PA sat 75%, mean PA 18 mm Hg; mean PCWP 12 mm Hg; CO 6.9 L/min; CI 3.1   Continue dual antiplatelet therapy and aggressive secondary prevention.  Hydrate post procedure with plan for same day discharge.   Diagnostic Dominance: Right  Intervention      Labs/Other Tests and Data Reviewed:     EKG:  No new  Recent Labs: 07/27/2018: NT-Pro BNP 124; Platelets 309; TSH 6.200 08/07/2018: BUN 25; Creatinine, Ser 1.41; Hemoglobin 11.6; Potassium 3.2; Sodium 142   Recent Lipid Panel Lab Results  Component Value Date/Time   CHOL 219 (H) 07/27/2018 12:39 PM   TRIG 269 (H) 07/27/2018 12:39 PM   HDL 32 (L) 07/27/2018 12:39 PM   CHOLHDL 6.8 (H) 07/27/2018 12:39 PM   CHOLHDL 11.4 08/15/2008 04:08 AM   LDLCALC 133 (H) 07/27/2018 12:39 PM    Wt Readings from Last 3 Encounters:  08/24/18 231 lb (104.8 kg)  08/07/18 234 lb (106.1 kg)  08/01/18 234 lb (106.1 kg)     Objective:    Vital Signs:  BP 129/79   Pulse 87   Ht 5\' 10"  (1.778 m)   Wt 231 lb (104.8 kg)   BMI 33.15 kg/m    VITAL SIGNS:  reviewed GEN:  no acute distress EYES:  sclerae anicteric, EOMI - Extraocular Movements Intact RESPIRATORY:  normal respiratory effort, symmetric expansion SKIN:  no rash, lesions or ulcers. MUSCULOSKELETAL:  no obvious deformities. NEURO:  alert and oriented x 3, no obvious focal deficit PSYCH:  normal affect  ASSESSMENT & PLAN:    Coronary artery disease - Distal RCA/PDA stents, 2, placed.  Overall feels much better.  Continue with current plan.  Consider virtual cardiac rehab.  Dual antiplatelet therapy for a year.  Hyperlipidemia - We will go ahead and try to start Crestor 10 mg once a day.  I have asked him to cut the pill in half to 5 take that for a week and then if that does well then increase to 10.  His wife takes Crestor.  CKD 3 -Creatinine was 1.4 range surrounding the catheterization.  Avoid NSAIDs.  COVID-19 Education: The signs and symptoms of COVID-19 were discussed with the patient and how to seek care for testing (follow up with PCP or arrange E-visit).  The importance of social distancing was discussed today.  Time:   Today, I have spent 12 minutes with the patient with telehealth technology discussing the above problems.     Medication Adjustments/Labs and  Tests Ordered: Current medicines are reviewed at length with the patient today.  Concerns regarding medicines are outlined above.   Tests Ordered: No orders of the defined types were placed in this encounter.   Medication Changes: Meds ordered this encounter  Medications  . rosuvastatin (CRESTOR) 10 MG tablet    Sig: Take 1 tablet (10 mg total) by mouth at bedtime.    Dispense:  90 tablet    Refill:  3    Follow Up:  Virtual Visit or In Person in 3 month(s)  Signed, Candee Furbish, MD  08/24/2018 9:14 AM    [place]

## 2018-08-27 ENCOUNTER — Encounter (HOSPITAL_COMMUNITY): Payer: Self-pay

## 2018-08-27 ENCOUNTER — Telehealth (HOSPITAL_COMMUNITY): Payer: Self-pay

## 2018-08-27 NOTE — Telephone Encounter (Signed)
No response from pt, message left on voicemail regarding Virtual Cardiac Rehab for the continuation of his participation.    Please contact letter sent to address on file in epic. Await response, if no response by September 10, 2018 will discharge pt from the program.

## 2018-09-17 DIAGNOSIS — J019 Acute sinusitis, unspecified: Secondary | ICD-10-CM | POA: Diagnosis not present

## 2018-10-03 ENCOUNTER — Observation Stay (HOSPITAL_COMMUNITY): Payer: Medicare HMO

## 2018-10-03 ENCOUNTER — Encounter (HOSPITAL_COMMUNITY): Payer: Self-pay

## 2018-10-03 ENCOUNTER — Other Ambulatory Visit: Payer: Self-pay

## 2018-10-03 ENCOUNTER — Emergency Department (HOSPITAL_COMMUNITY): Payer: Medicare HMO

## 2018-10-03 ENCOUNTER — Telehealth: Payer: Self-pay | Admitting: Cardiology

## 2018-10-03 ENCOUNTER — Inpatient Hospital Stay (HOSPITAL_COMMUNITY)
Admission: EM | Admit: 2018-10-03 | Discharge: 2018-10-07 | DRG: 683 | Disposition: A | Discharge status: Home / Self Care | Payer: Medicare HMO | Attending: Internal Medicine | Admitting: Internal Medicine

## 2018-10-03 DIAGNOSIS — I444 Left anterior fascicular block: Secondary | ICD-10-CM | POA: Diagnosis present

## 2018-10-03 DIAGNOSIS — E785 Hyperlipidemia, unspecified: Secondary | ICD-10-CM | POA: Diagnosis not present

## 2018-10-03 DIAGNOSIS — E876 Hypokalemia: Secondary | ICD-10-CM | POA: Diagnosis not present

## 2018-10-03 DIAGNOSIS — M19011 Primary osteoarthritis, right shoulder: Secondary | ICD-10-CM | POA: Diagnosis not present

## 2018-10-03 DIAGNOSIS — G4733 Obstructive sleep apnea (adult) (pediatric): Secondary | ICD-10-CM | POA: Diagnosis present

## 2018-10-03 DIAGNOSIS — Z7902 Long term (current) use of antithrombotics/antiplatelets: Secondary | ICD-10-CM

## 2018-10-03 DIAGNOSIS — N179 Acute kidney failure, unspecified: Secondary | ICD-10-CM | POA: Diagnosis not present

## 2018-10-03 DIAGNOSIS — Z9111 Patient's noncompliance with dietary regimen: Secondary | ICD-10-CM

## 2018-10-03 DIAGNOSIS — R0600 Dyspnea, unspecified: Secondary | ICD-10-CM | POA: Diagnosis present

## 2018-10-03 DIAGNOSIS — J329 Chronic sinusitis, unspecified: Secondary | ICD-10-CM | POA: Diagnosis present

## 2018-10-03 DIAGNOSIS — D860 Sarcoidosis of lung: Secondary | ICD-10-CM | POA: Diagnosis present

## 2018-10-03 DIAGNOSIS — I130 Hypertensive heart and chronic kidney disease with heart failure and stage 1 through stage 4 chronic kidney disease, or unspecified chronic kidney disease: Secondary | ICD-10-CM | POA: Diagnosis not present

## 2018-10-03 DIAGNOSIS — D869 Sarcoidosis, unspecified: Secondary | ICD-10-CM | POA: Diagnosis present

## 2018-10-03 DIAGNOSIS — N183 Chronic kidney disease, stage 3 unspecified: Secondary | ICD-10-CM | POA: Diagnosis present

## 2018-10-03 DIAGNOSIS — Z8249 Family history of ischemic heart disease and other diseases of the circulatory system: Secondary | ICD-10-CM

## 2018-10-03 DIAGNOSIS — D509 Iron deficiency anemia, unspecified: Secondary | ICD-10-CM | POA: Diagnosis not present

## 2018-10-03 DIAGNOSIS — R0789 Other chest pain: Secondary | ICD-10-CM | POA: Diagnosis not present

## 2018-10-03 DIAGNOSIS — G8929 Other chronic pain: Secondary | ICD-10-CM | POA: Diagnosis present

## 2018-10-03 DIAGNOSIS — T380X5A Adverse effect of glucocorticoids and synthetic analogues, initial encounter: Secondary | ICD-10-CM | POA: Diagnosis present

## 2018-10-03 DIAGNOSIS — Z7952 Long term (current) use of systemic steroids: Secondary | ICD-10-CM

## 2018-10-03 DIAGNOSIS — N189 Chronic kidney disease, unspecified: Secondary | ICD-10-CM | POA: Diagnosis not present

## 2018-10-03 DIAGNOSIS — E039 Hypothyroidism, unspecified: Secondary | ICD-10-CM

## 2018-10-03 DIAGNOSIS — Z8051 Family history of malignant neoplasm of kidney: Secondary | ICD-10-CM

## 2018-10-03 DIAGNOSIS — E860 Dehydration: Secondary | ICD-10-CM | POA: Diagnosis present

## 2018-10-03 DIAGNOSIS — Z20828 Contact with and (suspected) exposure to other viral communicable diseases: Secondary | ICD-10-CM | POA: Diagnosis not present

## 2018-10-03 DIAGNOSIS — E669 Obesity, unspecified: Secondary | ICD-10-CM | POA: Diagnosis present

## 2018-10-03 DIAGNOSIS — Z79899 Other long term (current) drug therapy: Secondary | ICD-10-CM

## 2018-10-03 DIAGNOSIS — I2510 Atherosclerotic heart disease of native coronary artery without angina pectoris: Secondary | ICD-10-CM

## 2018-10-03 DIAGNOSIS — I700 Atherosclerosis of aorta: Secondary | ICD-10-CM | POA: Diagnosis not present

## 2018-10-03 DIAGNOSIS — E2740 Unspecified adrenocortical insufficiency: Secondary | ICD-10-CM | POA: Diagnosis not present

## 2018-10-03 DIAGNOSIS — Z6833 Body mass index (BMI) 33.0-33.9, adult: Secondary | ICD-10-CM

## 2018-10-03 DIAGNOSIS — M7989 Other specified soft tissue disorders: Secondary | ICD-10-CM | POA: Diagnosis not present

## 2018-10-03 DIAGNOSIS — D508 Other iron deficiency anemias: Secondary | ICD-10-CM | POA: Diagnosis present

## 2018-10-03 DIAGNOSIS — R52 Pain, unspecified: Secondary | ICD-10-CM | POA: Diagnosis not present

## 2018-10-03 DIAGNOSIS — I25118 Atherosclerotic heart disease of native coronary artery with other forms of angina pectoris: Secondary | ICD-10-CM

## 2018-10-03 DIAGNOSIS — D861 Sarcoidosis of lymph nodes: Secondary | ICD-10-CM | POA: Diagnosis not present

## 2018-10-03 DIAGNOSIS — J984 Other disorders of lung: Secondary | ICD-10-CM | POA: Diagnosis not present

## 2018-10-03 DIAGNOSIS — I5032 Chronic diastolic (congestive) heart failure: Secondary | ICD-10-CM | POA: Diagnosis not present

## 2018-10-03 DIAGNOSIS — Z825 Family history of asthma and other chronic lower respiratory diseases: Secondary | ICD-10-CM

## 2018-10-03 DIAGNOSIS — K219 Gastro-esophageal reflux disease without esophagitis: Secondary | ICD-10-CM

## 2018-10-03 DIAGNOSIS — Z794 Long term (current) use of insulin: Secondary | ICD-10-CM | POA: Diagnosis not present

## 2018-10-03 DIAGNOSIS — R079 Chest pain, unspecified: Secondary | ICD-10-CM | POA: Diagnosis not present

## 2018-10-03 DIAGNOSIS — Z833 Family history of diabetes mellitus: Secondary | ICD-10-CM

## 2018-10-03 DIAGNOSIS — R000 Tachycardia, unspecified: Secondary | ICD-10-CM | POA: Diagnosis not present

## 2018-10-03 DIAGNOSIS — N1831 Chronic kidney disease, stage 3a: Secondary | ICD-10-CM | POA: Diagnosis present

## 2018-10-03 DIAGNOSIS — E1122 Type 2 diabetes mellitus with diabetic chronic kidney disease: Secondary | ICD-10-CM | POA: Diagnosis present

## 2018-10-03 DIAGNOSIS — D631 Anemia in chronic kidney disease: Secondary | ICD-10-CM | POA: Diagnosis not present

## 2018-10-03 DIAGNOSIS — M25521 Pain in right elbow: Secondary | ICD-10-CM | POA: Diagnosis not present

## 2018-10-03 DIAGNOSIS — E119 Type 2 diabetes mellitus without complications: Secondary | ICD-10-CM | POA: Diagnosis not present

## 2018-10-03 DIAGNOSIS — Z955 Presence of coronary angioplasty implant and graft: Secondary | ICD-10-CM

## 2018-10-03 DIAGNOSIS — M79603 Pain in arm, unspecified: Secondary | ICD-10-CM

## 2018-10-03 DIAGNOSIS — K0889 Other specified disorders of teeth and supporting structures: Secondary | ICD-10-CM | POA: Diagnosis present

## 2018-10-03 DIAGNOSIS — I252 Old myocardial infarction: Secondary | ICD-10-CM

## 2018-10-03 DIAGNOSIS — E8809 Other disorders of plasma-protein metabolism, not elsewhere classified: Secondary | ICD-10-CM

## 2018-10-03 DIAGNOSIS — E1165 Type 2 diabetes mellitus with hyperglycemia: Secondary | ICD-10-CM | POA: Diagnosis not present

## 2018-10-03 DIAGNOSIS — IMO0002 Reserved for concepts with insufficient information to code with codable children: Secondary | ICD-10-CM | POA: Diagnosis present

## 2018-10-03 DIAGNOSIS — Z7982 Long term (current) use of aspirin: Secondary | ICD-10-CM

## 2018-10-03 DIAGNOSIS — M549 Dorsalgia, unspecified: Secondary | ICD-10-CM | POA: Diagnosis present

## 2018-10-03 DIAGNOSIS — Z841 Family history of disorders of kidney and ureter: Secondary | ICD-10-CM

## 2018-10-03 DIAGNOSIS — M79601 Pain in right arm: Secondary | ICD-10-CM | POA: Diagnosis not present

## 2018-10-03 DIAGNOSIS — Z79890 Hormone replacement therapy (postmenopausal): Secondary | ICD-10-CM

## 2018-10-03 DIAGNOSIS — R2231 Localized swelling, mass and lump, right upper limb: Secondary | ICD-10-CM | POA: Diagnosis present

## 2018-10-03 DIAGNOSIS — I5022 Chronic systolic (congestive) heart failure: Secondary | ICD-10-CM | POA: Diagnosis not present

## 2018-10-03 DIAGNOSIS — Z888 Allergy status to other drugs, medicaments and biological substances status: Secondary | ICD-10-CM

## 2018-10-03 DIAGNOSIS — Z9114 Patient's other noncompliance with medication regimen: Secondary | ICD-10-CM

## 2018-10-03 DIAGNOSIS — N19 Unspecified kidney failure: Secondary | ICD-10-CM | POA: Diagnosis not present

## 2018-10-03 DIAGNOSIS — G4730 Sleep apnea, unspecified: Secondary | ICD-10-CM

## 2018-10-03 LAB — CBC WITH DIFFERENTIAL/PLATELET
Abs Immature Granulocytes: 0.24 10*3/uL — ABNORMAL HIGH (ref 0.00–0.07)
Basophils Absolute: 0.1 10*3/uL (ref 0.0–0.1)
Basophils Relative: 1 %
Eosinophils Absolute: 0.2 10*3/uL (ref 0.0–0.5)
Eosinophils Relative: 2 %
HCT: 34.7 % — ABNORMAL LOW (ref 39.0–52.0)
Hemoglobin: 11.2 g/dL — ABNORMAL LOW (ref 13.0–17.0)
Immature Granulocytes: 2 %
Lymphocytes Relative: 12 %
Lymphs Abs: 1.6 10*3/uL (ref 0.7–4.0)
MCH: 31.4 pg (ref 26.0–34.0)
MCHC: 32.3 g/dL (ref 30.0–36.0)
MCV: 97.2 fL (ref 80.0–100.0)
Monocytes Absolute: 1.2 10*3/uL — ABNORMAL HIGH (ref 0.1–1.0)
Monocytes Relative: 9 %
Neutro Abs: 10.4 10*3/uL — ABNORMAL HIGH (ref 1.7–7.7)
Neutrophils Relative %: 74 %
Platelets: 278 10*3/uL (ref 150–400)
RBC: 3.57 MIL/uL — ABNORMAL LOW (ref 4.22–5.81)
RDW: 13.2 % (ref 11.5–15.5)
WBC: 13.6 10*3/uL — ABNORMAL HIGH (ref 4.0–10.5)
nRBC: 0 % (ref 0.0–0.2)

## 2018-10-03 LAB — CBC
HCT: 36.4 % — ABNORMAL LOW (ref 39.0–52.0)
Hemoglobin: 11.6 g/dL — ABNORMAL LOW (ref 13.0–17.0)
MCH: 31 pg (ref 26.0–34.0)
MCHC: 31.9 g/dL (ref 30.0–36.0)
MCV: 97.3 fL (ref 80.0–100.0)
Platelets: 339 10*3/uL (ref 150–400)
RBC: 3.74 MIL/uL — ABNORMAL LOW (ref 4.22–5.81)
RDW: 13.2 % (ref 11.5–15.5)
WBC: 14.5 10*3/uL — ABNORMAL HIGH (ref 4.0–10.5)
nRBC: 0 % (ref 0.0–0.2)

## 2018-10-03 LAB — HEPATIC FUNCTION PANEL
ALT: 16 U/L (ref 0–44)
AST: 14 U/L — ABNORMAL LOW (ref 15–41)
Albumin: 3.2 g/dL — ABNORMAL LOW (ref 3.5–5.0)
Alkaline Phosphatase: 52 U/L (ref 38–126)
Bilirubin, Direct: 0.2 mg/dL (ref 0.0–0.2)
Indirect Bilirubin: 0.7 mg/dL (ref 0.3–0.9)
Total Bilirubin: 0.9 mg/dL (ref 0.3–1.2)
Total Protein: 7 g/dL (ref 6.5–8.1)

## 2018-10-03 LAB — BASIC METABOLIC PANEL
Anion gap: 18 — ABNORMAL HIGH (ref 5–15)
BUN: 41 mg/dL — ABNORMAL HIGH (ref 8–23)
CO2: 21 mmol/L — ABNORMAL LOW (ref 22–32)
Calcium: 8.8 mg/dL — ABNORMAL LOW (ref 8.9–10.3)
Chloride: 99 mmol/L (ref 98–111)
Creatinine, Ser: 3.24 mg/dL — ABNORMAL HIGH (ref 0.61–1.24)
GFR calc Af Amer: 21 mL/min — ABNORMAL LOW (ref >60)
GFR calc non Af Amer: 18 mL/min — ABNORMAL LOW (ref >60)
Glucose, Bld: 241 mg/dL — ABNORMAL HIGH (ref 70–99)
Potassium: 3.4 mmol/L — ABNORMAL LOW (ref 3.5–5.1)
Sodium: 138 mmol/L (ref 135–145)

## 2018-10-03 LAB — T4, FREE: Free T4: 0.98 ng/dL (ref 0.61–1.12)

## 2018-10-03 LAB — TROPONIN I (HIGH SENSITIVITY)
Troponin I (High Sensitivity): 13 ng/L (ref <18)
Troponin I (High Sensitivity): 15 ng/L (ref <18)

## 2018-10-03 LAB — URINALYSIS, ROUTINE W REFLEX MICROSCOPIC
Bilirubin Urine: NEGATIVE
Glucose, UA: NEGATIVE mg/dL
Hgb urine dipstick: NEGATIVE
Ketones, ur: NEGATIVE mg/dL
Leukocytes,Ua: NEGATIVE
Nitrite: NEGATIVE
Protein, ur: 30 mg/dL — AB
Specific Gravity, Urine: 1.015 (ref 1.005–1.030)
pH: 5 (ref 5.0–8.0)

## 2018-10-03 LAB — PHOSPHORUS: Phosphorus: 4.8 mg/dL — ABNORMAL HIGH (ref 2.5–4.6)

## 2018-10-03 LAB — SODIUM, URINE, RANDOM: Sodium, Ur: 16 mmol/L

## 2018-10-03 LAB — CREATININE, URINE, RANDOM: Creatinine, Urine: 229.1 mg/dL

## 2018-10-03 LAB — SARS CORONAVIRUS 2 BY RT PCR (HOSPITAL ORDER, PERFORMED IN ~~LOC~~ HOSPITAL LAB): SARS Coronavirus 2: NEGATIVE

## 2018-10-03 LAB — MAGNESIUM: Magnesium: 1.8 mg/dL (ref 1.7–2.4)

## 2018-10-03 LAB — CORTISOL: Cortisol, Plasma: 6.6 ug/dL

## 2018-10-03 LAB — TSH: TSH: 2.678 u[IU]/mL (ref 0.350–4.500)

## 2018-10-03 LAB — BRAIN NATRIURETIC PEPTIDE: B Natriuretic Peptide: 42.4 pg/mL (ref 0.0–100.0)

## 2018-10-03 IMAGING — CT CT CHEST WITHOUT CONTRAST
2 of 4 series · 15 of 36 positions shown, 18 images · non-contrast
Comparison: Chest radiographs dated [DATE]. CT chest dated
[DATE].

CLINICAL DATA: Sarcoidosis

EXAM:
CT CHEST WITHOUT CONTRAST
TECHNIQUE: Multidetector CT imaging of the chest was performed following the
standard protocol without IV contrast.

[Series 4: thorax 2.0 · axial · 0.95mm/px · z∈[+1138,+1476]mm · 12 of 189 slices shown, 15 images]
[im 10/189  mediastinal]
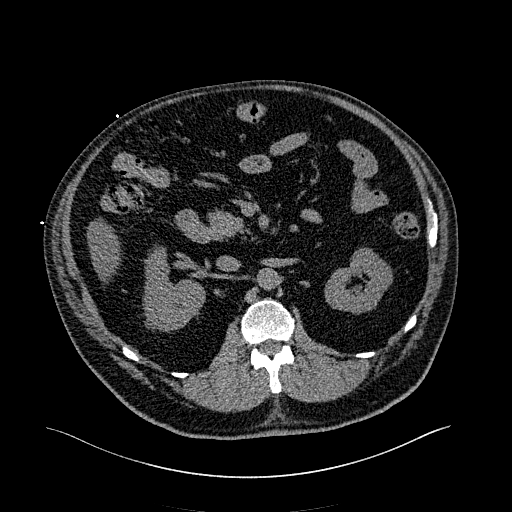
[im 10/189  lung]
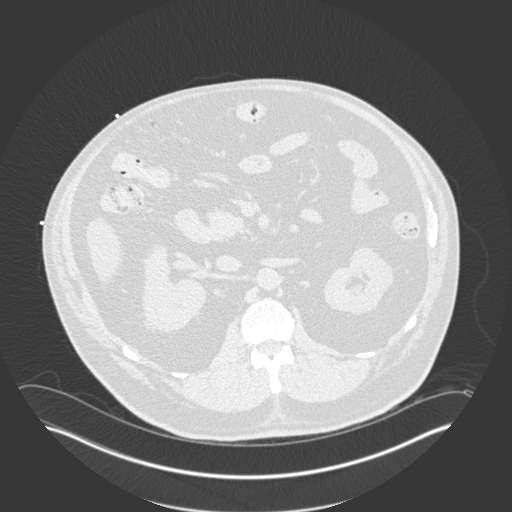
[im 30/189  lung]
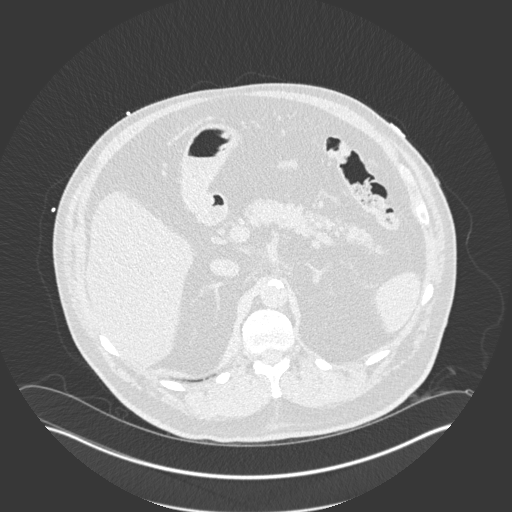
[im 40/189  lung]
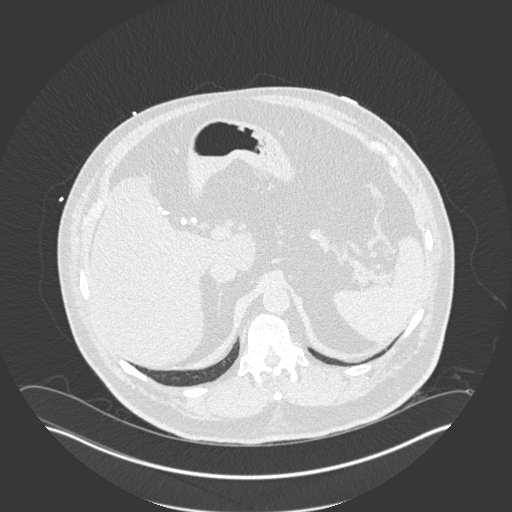
[im 60/189  lung]
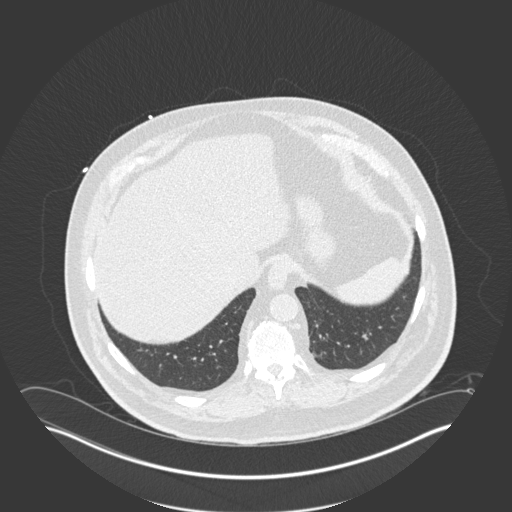
[im 70/189  mediastinal]
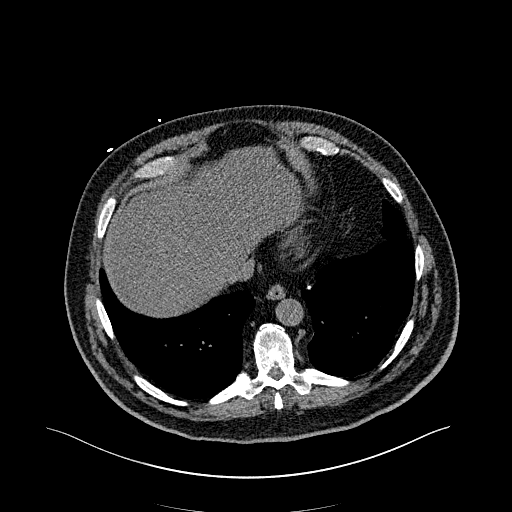
[im 70/189  lung]
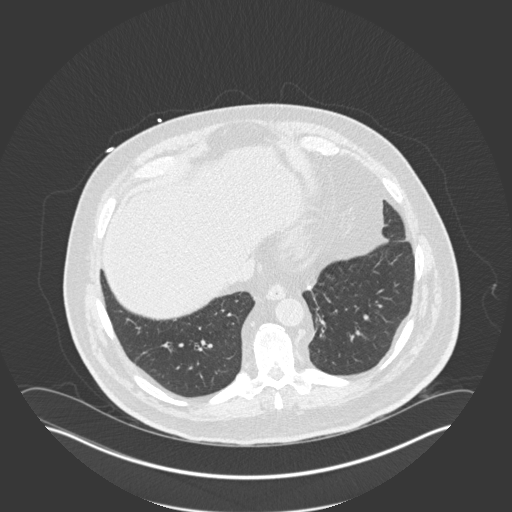
[im 90/189  lung]
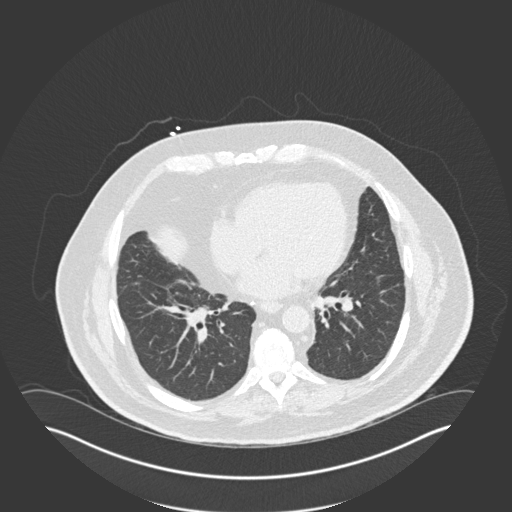
[im 99/189  lung]
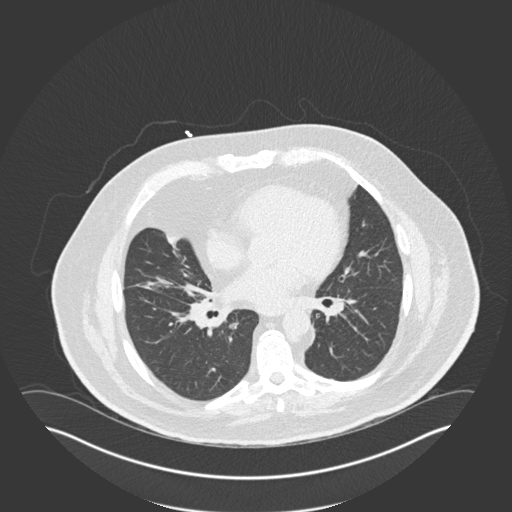
[im 119/189  lung]
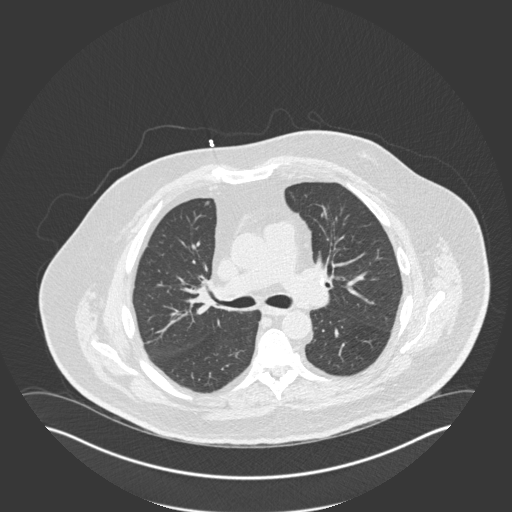
[im 129/189  mediastinal]
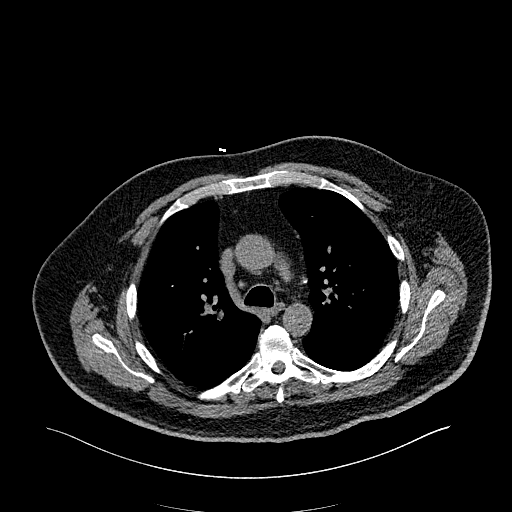
[im 129/189  lung]
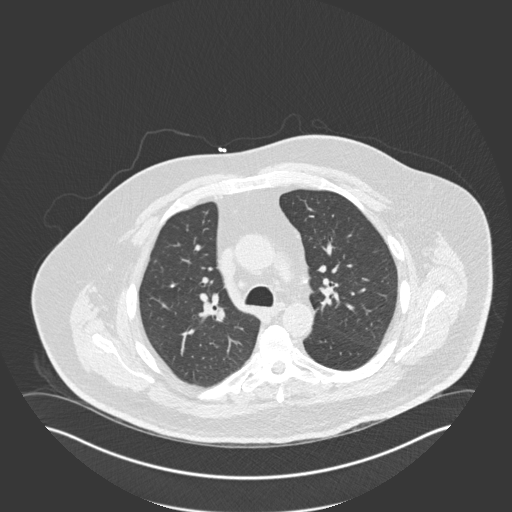
[im 149/189  lung]
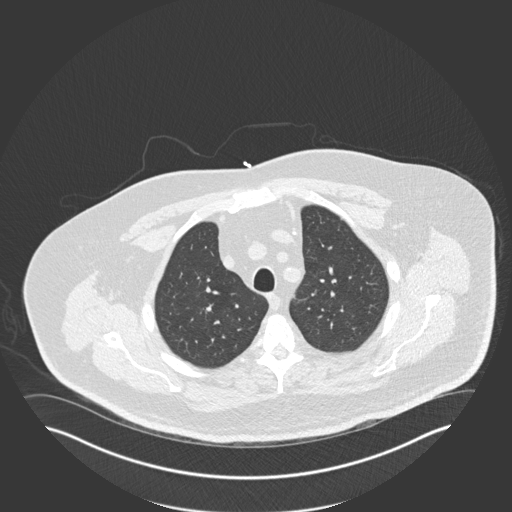
[im 159/189  lung]
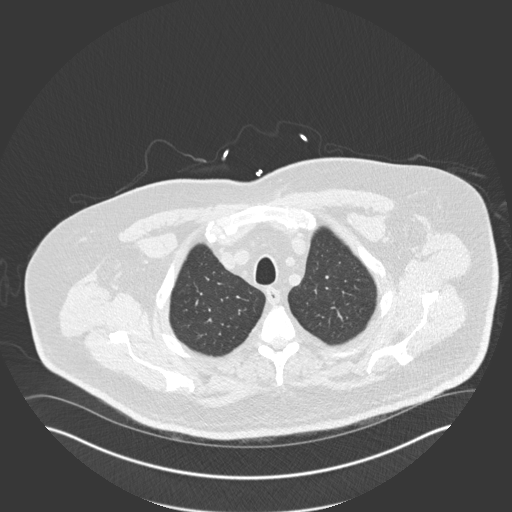
[im 179/189  lung]
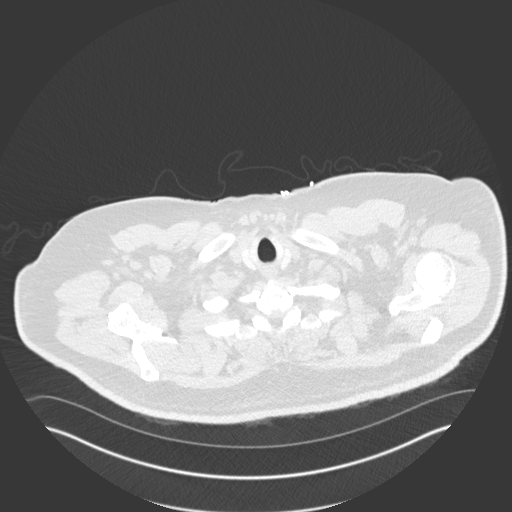

[Series 6: coronal · coronal · 0.74mm/px · 3 of 120 slices shown]
[im 24/120  lung]
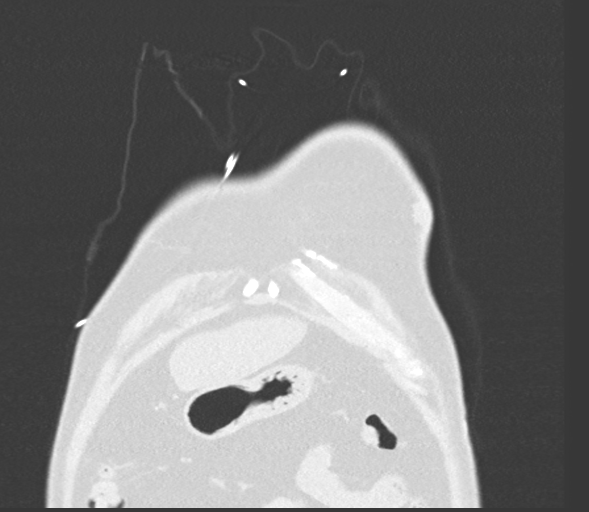
[im 48/120  lung]
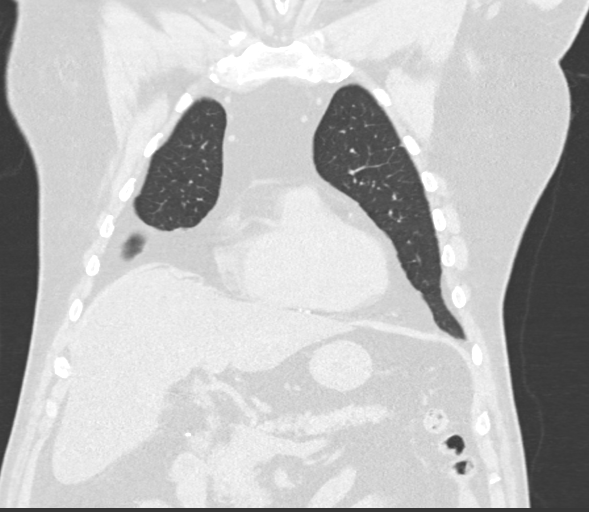
[im 72/120  lung]
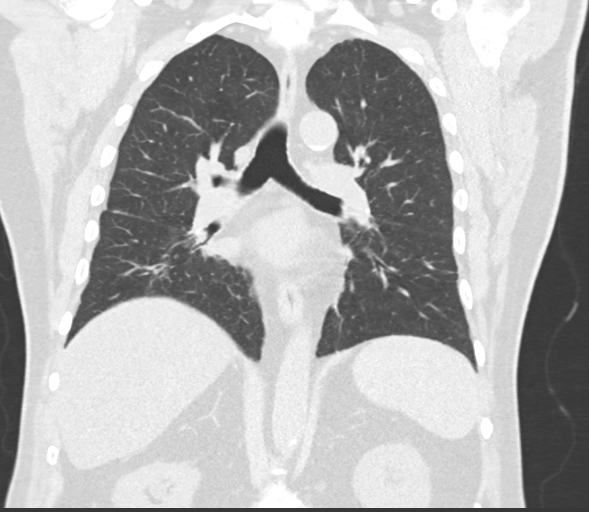

[15 of 36 positions shown; findings below may reference images not displayed]

FINDINGS: Cardiovascular: The heart is normal in size. No pericardial
effusion.

No evidence of thoracic aortic aneurysm. Mild atherosclerotic
calcifications of the aortic arch.

Coronary atherosclerosis with LAD stent.

Mediastinum/Nodes: No suspicious mediastinal lymphadenopathy.

Visualized thyroid is unremarkable.

Lungs/Pleura: 7 mm subpleural nodule in the medial right lower lobe
(series 8/image 37), unchanged from [PP], benign. Additional
triangular subpleural nodule in the right lower lobe (series 8/image
96), unchanged, benign. Additional subpleural nodule in the anterior
right upper lobe (series 8/image 72), unchanged, benign.

Scarring in the right middle lobe, chronic, possibly related to the
patient's known sarcoidosis.

No focal consolidation.

No pleural effusion or pneumothorax.

Upper Abdomen: Visualized upper abdomen is notable for hepatic
steatosis and prior cholecystectomy.

Musculoskeletal: Visualized osseous structures are within normal
limits.
IMPRESSION: Right middle lobe scarring, possibly related to the patient's known
sarcoidosis.

Stable small right lung nodules, unchanged from [PP], benign.
Dedicated follow-up imaging is not required per [HOSPITAL]
guidelines. This recommendation follows the consensus statement:
Guidelines for Management of Small Pulmonary Nodules Detected on CT
Images: From the [HOSPITAL] [PP]; Radiology [PP];
[DATE].

No evidence of acute cardiopulmonary disease.

Aortic Atherosclerosis ([PP]-[PP]).

## 2018-10-03 IMAGING — US US RENAL
1 series · 14 of 25 positions shown · non-contrast
Comparison: CT [DATE]

CLINICAL DATA: Renal failure.  Chronicity not specified.

EXAM:
RENAL / URINARY TRACT ULTRASOUND COMPLETE

[Series 1: us renal · 14 of 39 slices shown]
[im 1/39]
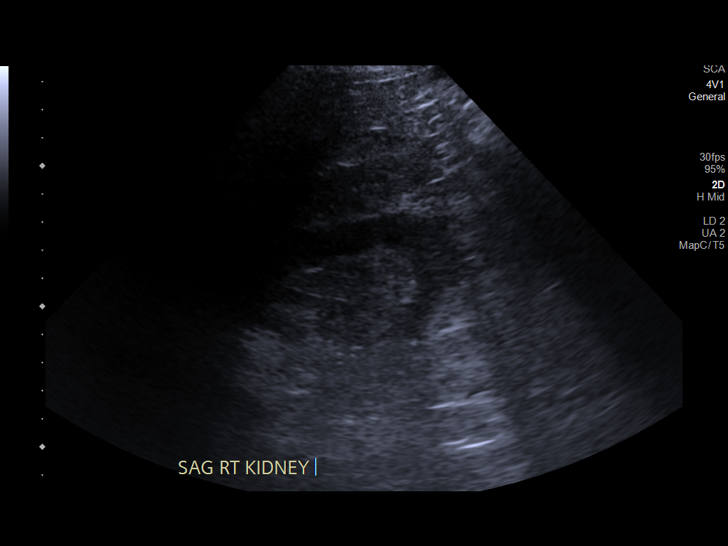
[im 4/39]
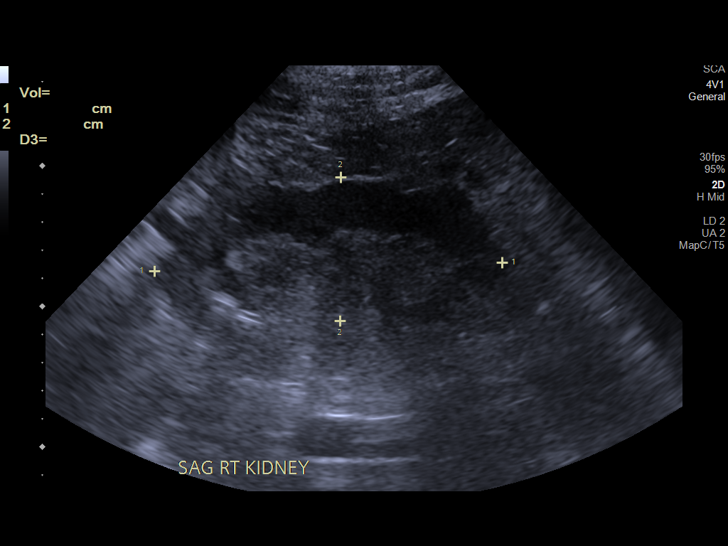
[im 7/39]
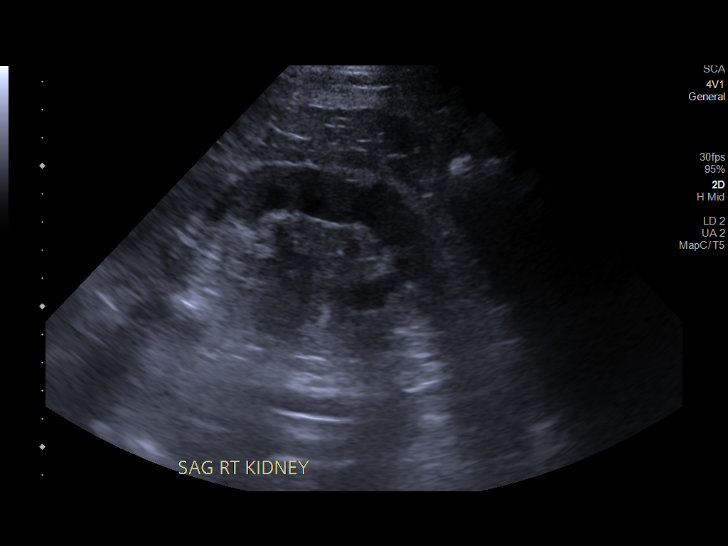
[im 10/39]
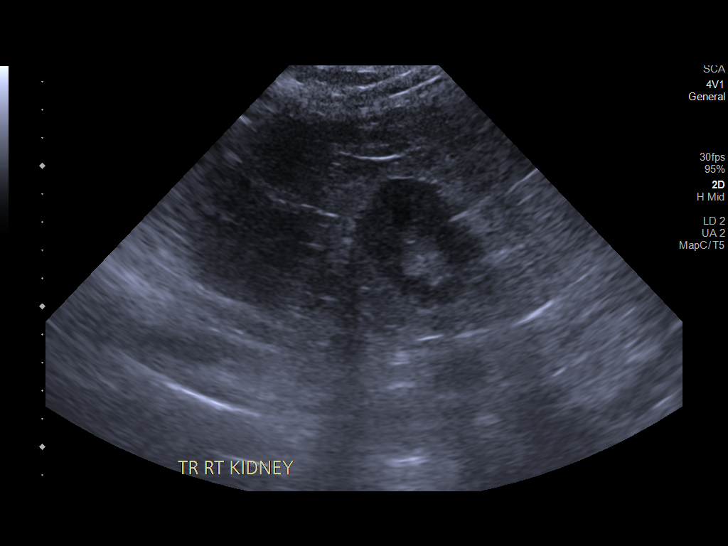
[im 13/39]
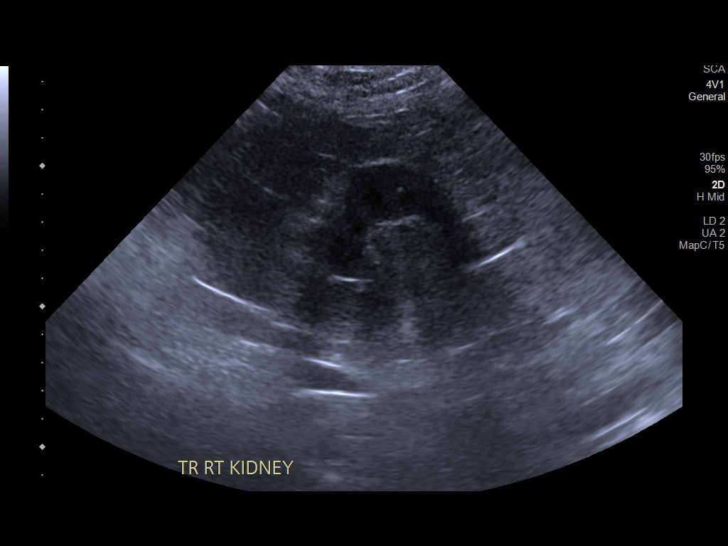
[im 15/39]
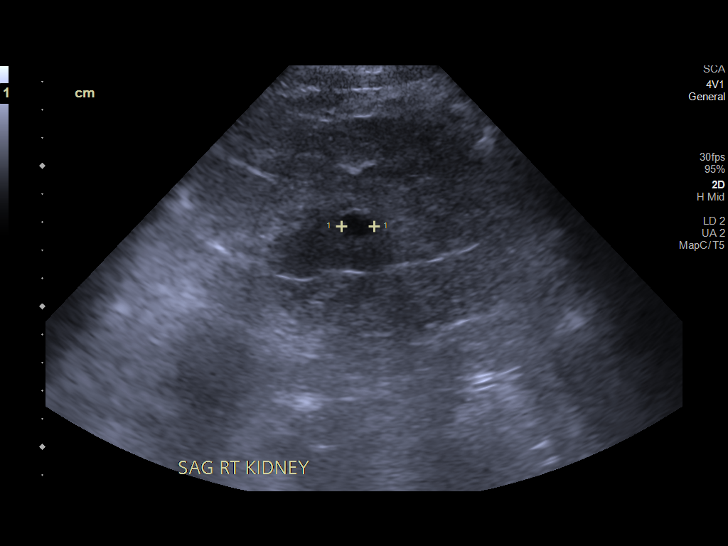
[im 18/39]
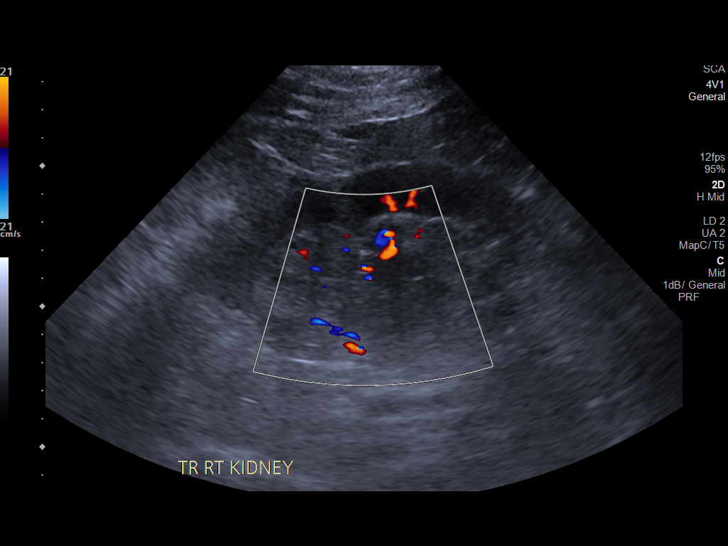
[im 21/39]
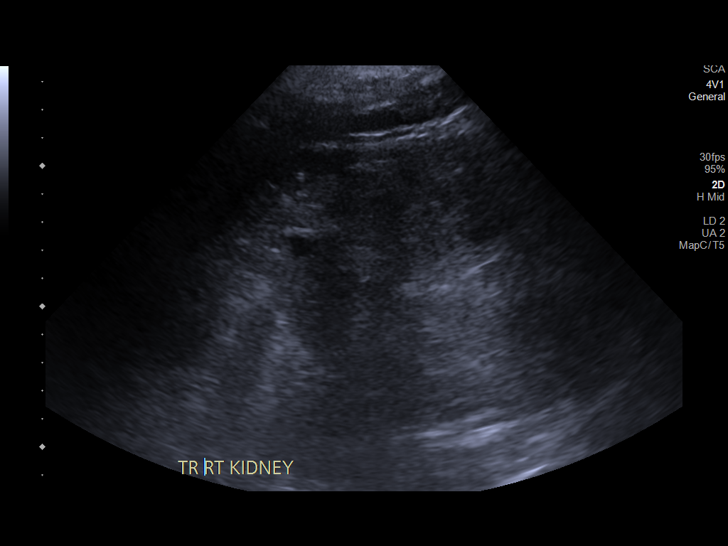
[im 24/39]
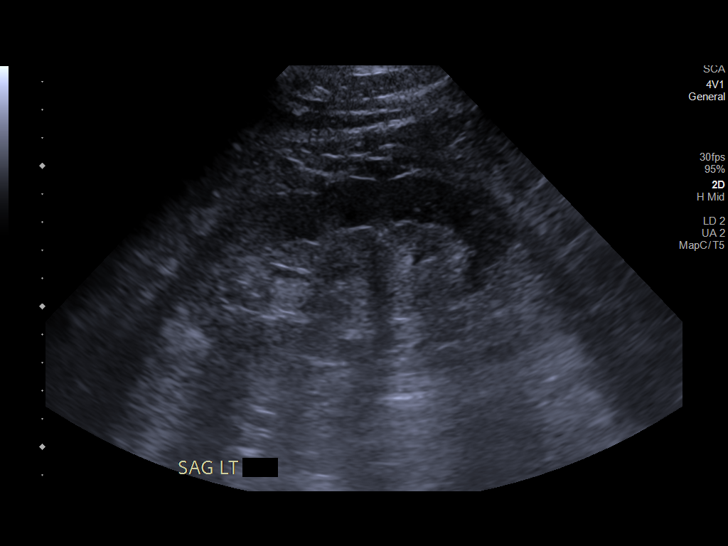
[im 26/39]
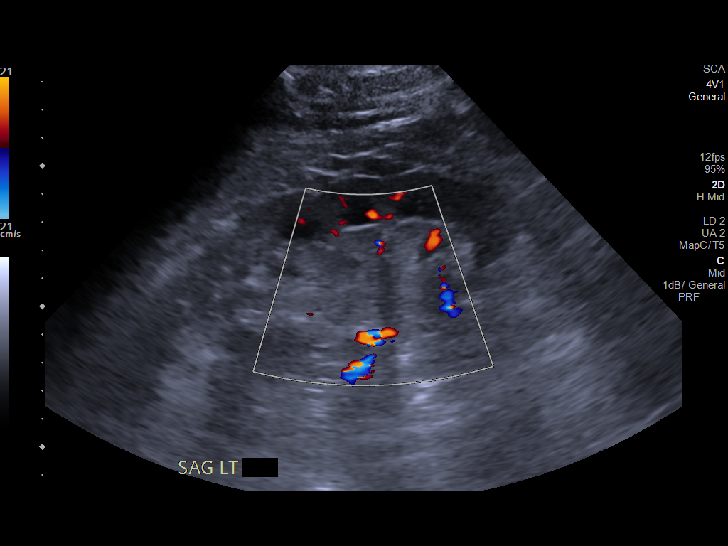
[im 29/39]
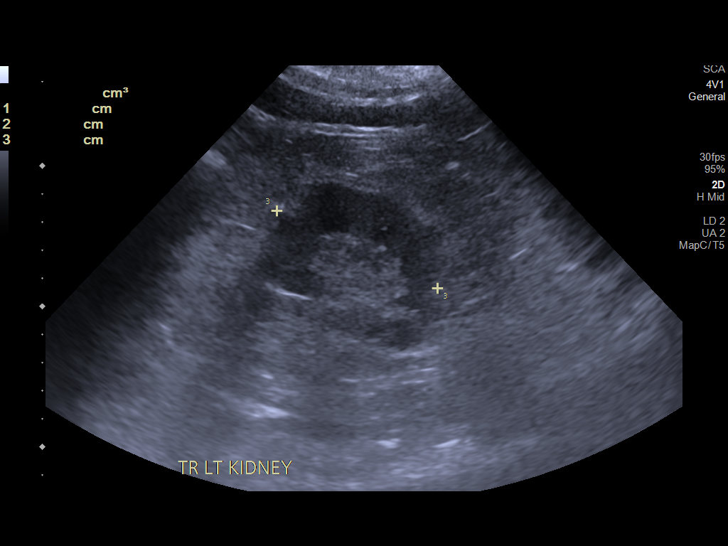
[im 32/39]
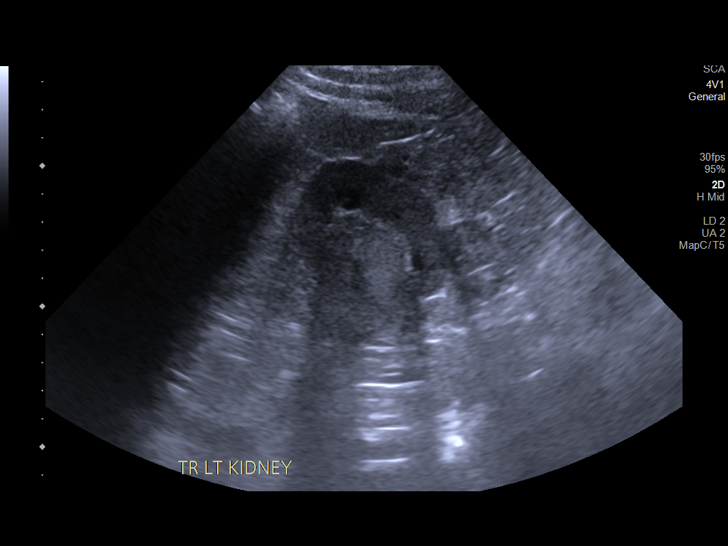
[im 35/39]
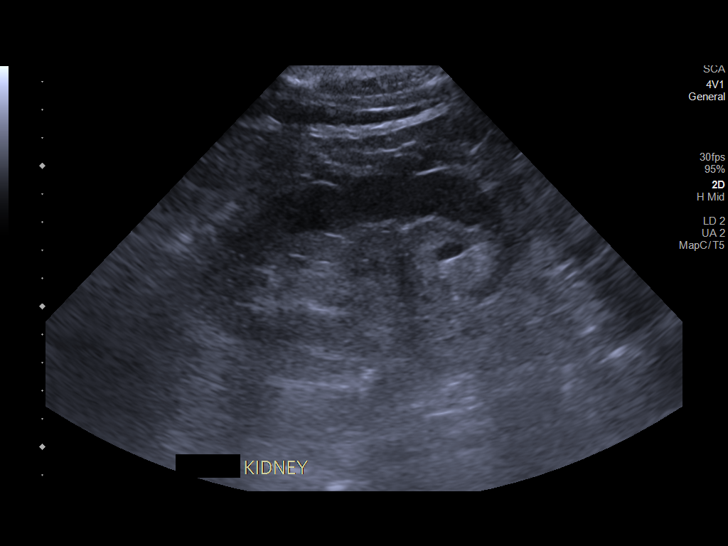
[im 39/39]
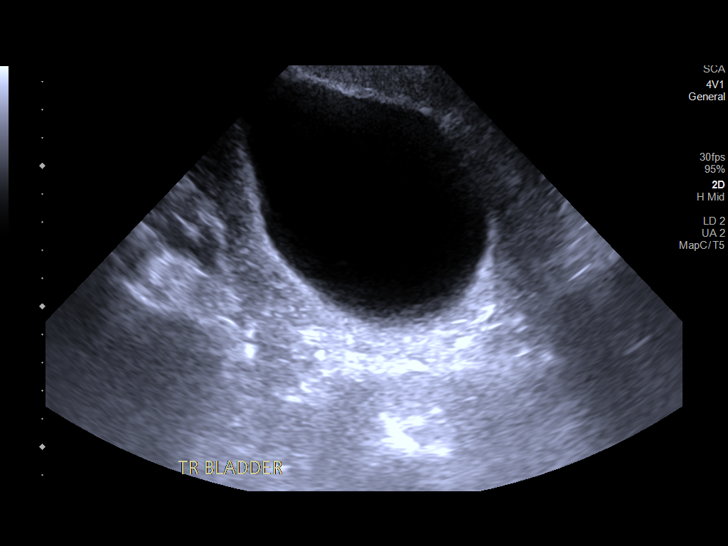

[14 of 25 positions shown; findings below may reference images not displayed]

FINDINGS: Right Kidney:

Renal measurements: 12.4 x 5.1 x 5.7 cm = volume: 188.7 mL.
Echogenicity within normal limits. Two small cyst in the right
kidney measuring 1.2 and 1.3 cm respectively. No solid mass or
hydronephrosis visualized.

Left Kidney:

Renal measurements: 11.8 x 5.8 x 6.4 cm = volume: 227 mL.
Echogenicity within normal limits. No mass or hydronephrosis
visualized.

Bladder:

Appears normal for degree of bladder distention.
IMPRESSION: Unremarkable renal ultrasound.  No obstructive uropathy.

Incidental small right renal cysts.

## 2018-10-03 IMAGING — DX CHEST - 2 VIEW
2 series · 2 of 2 positions shown · non-contrast
Comparison: [DATE], [DATE]

Correlation: CT chest [DATE]

CLINICAL DATA: Chest pain for 4 weeks, coronary stent put in 4
weeks ago, diabetes mellitus, coronary artery disease post MI,
hypertension, sarcoid

EXAM:
CHEST - 2 VIEW

[chest pa]
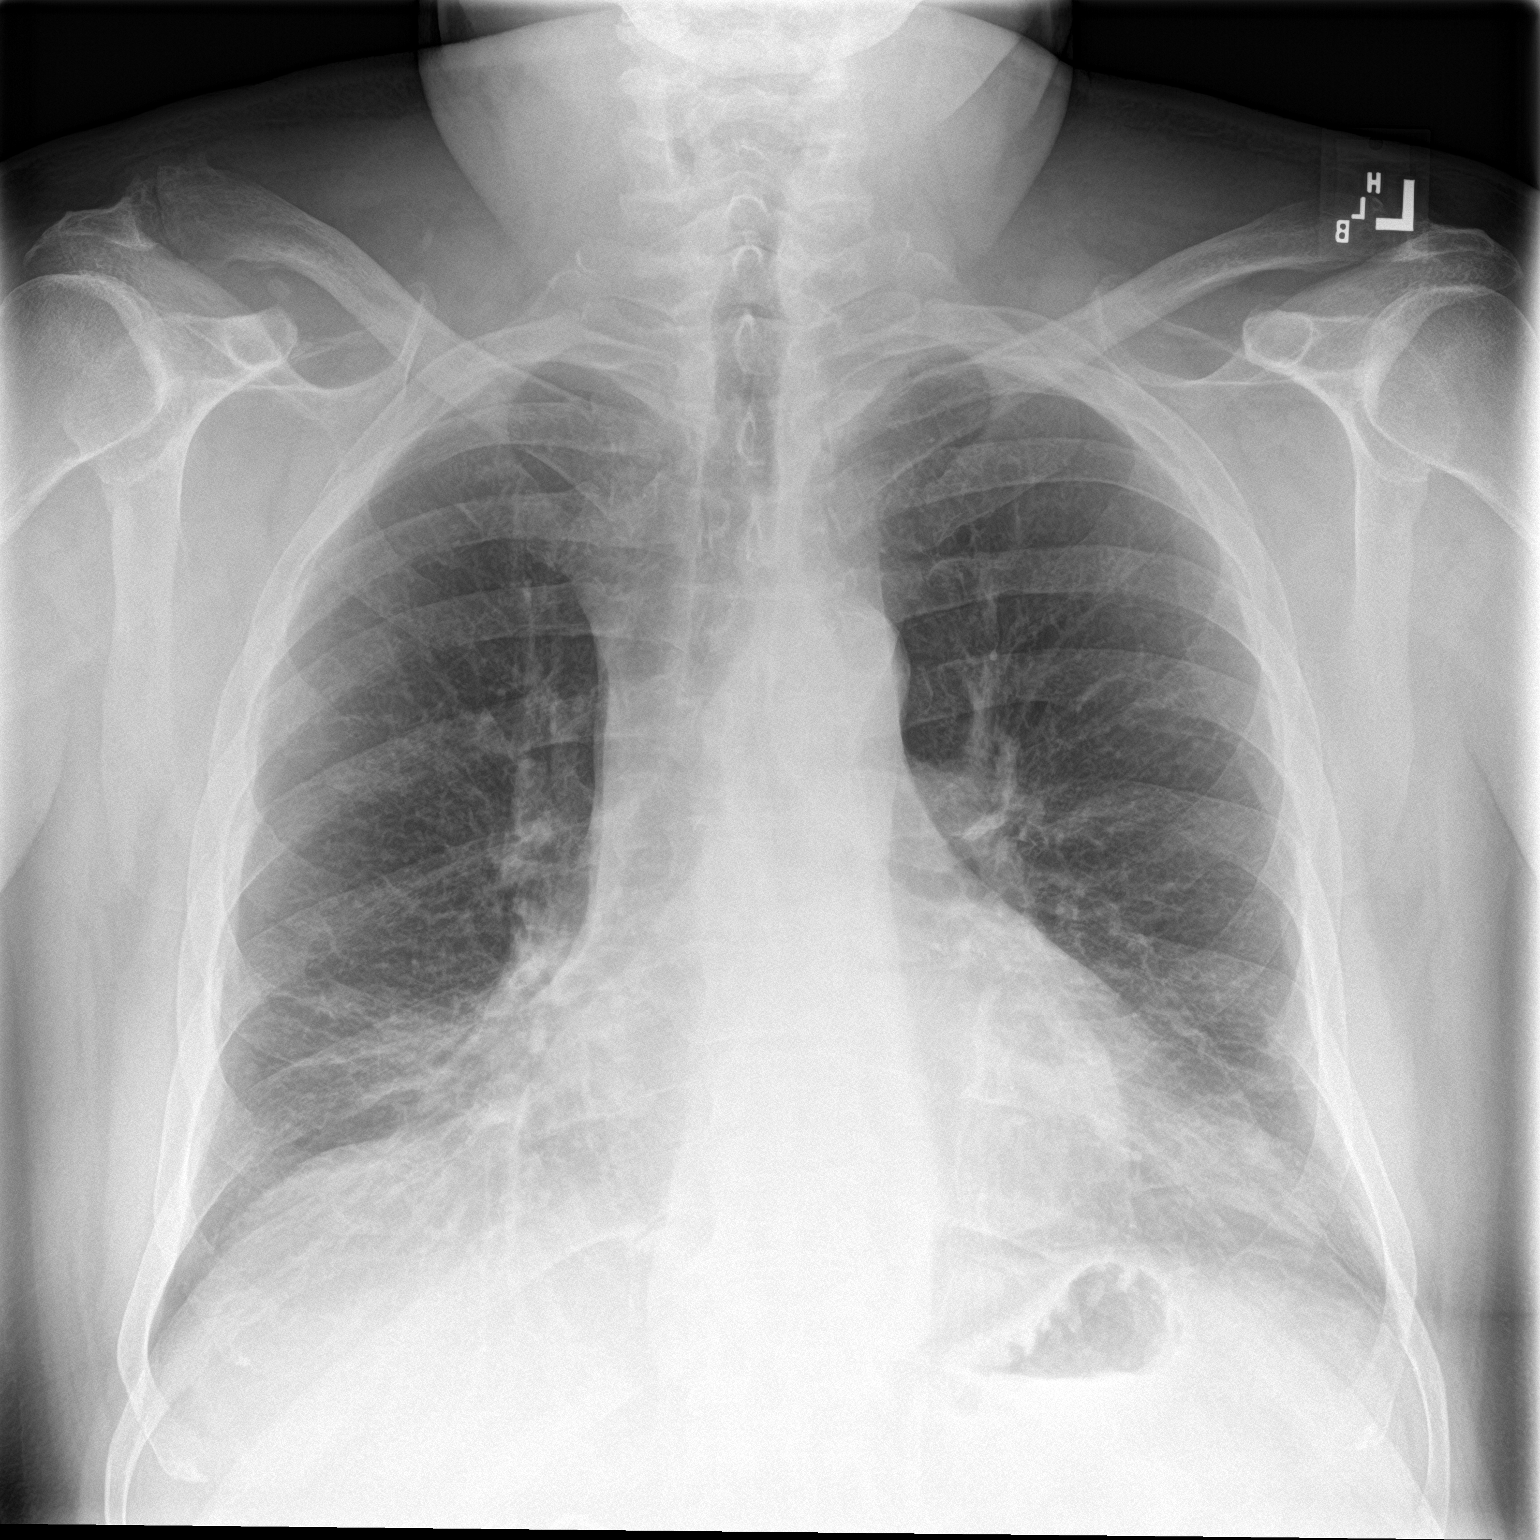

[chest lat]
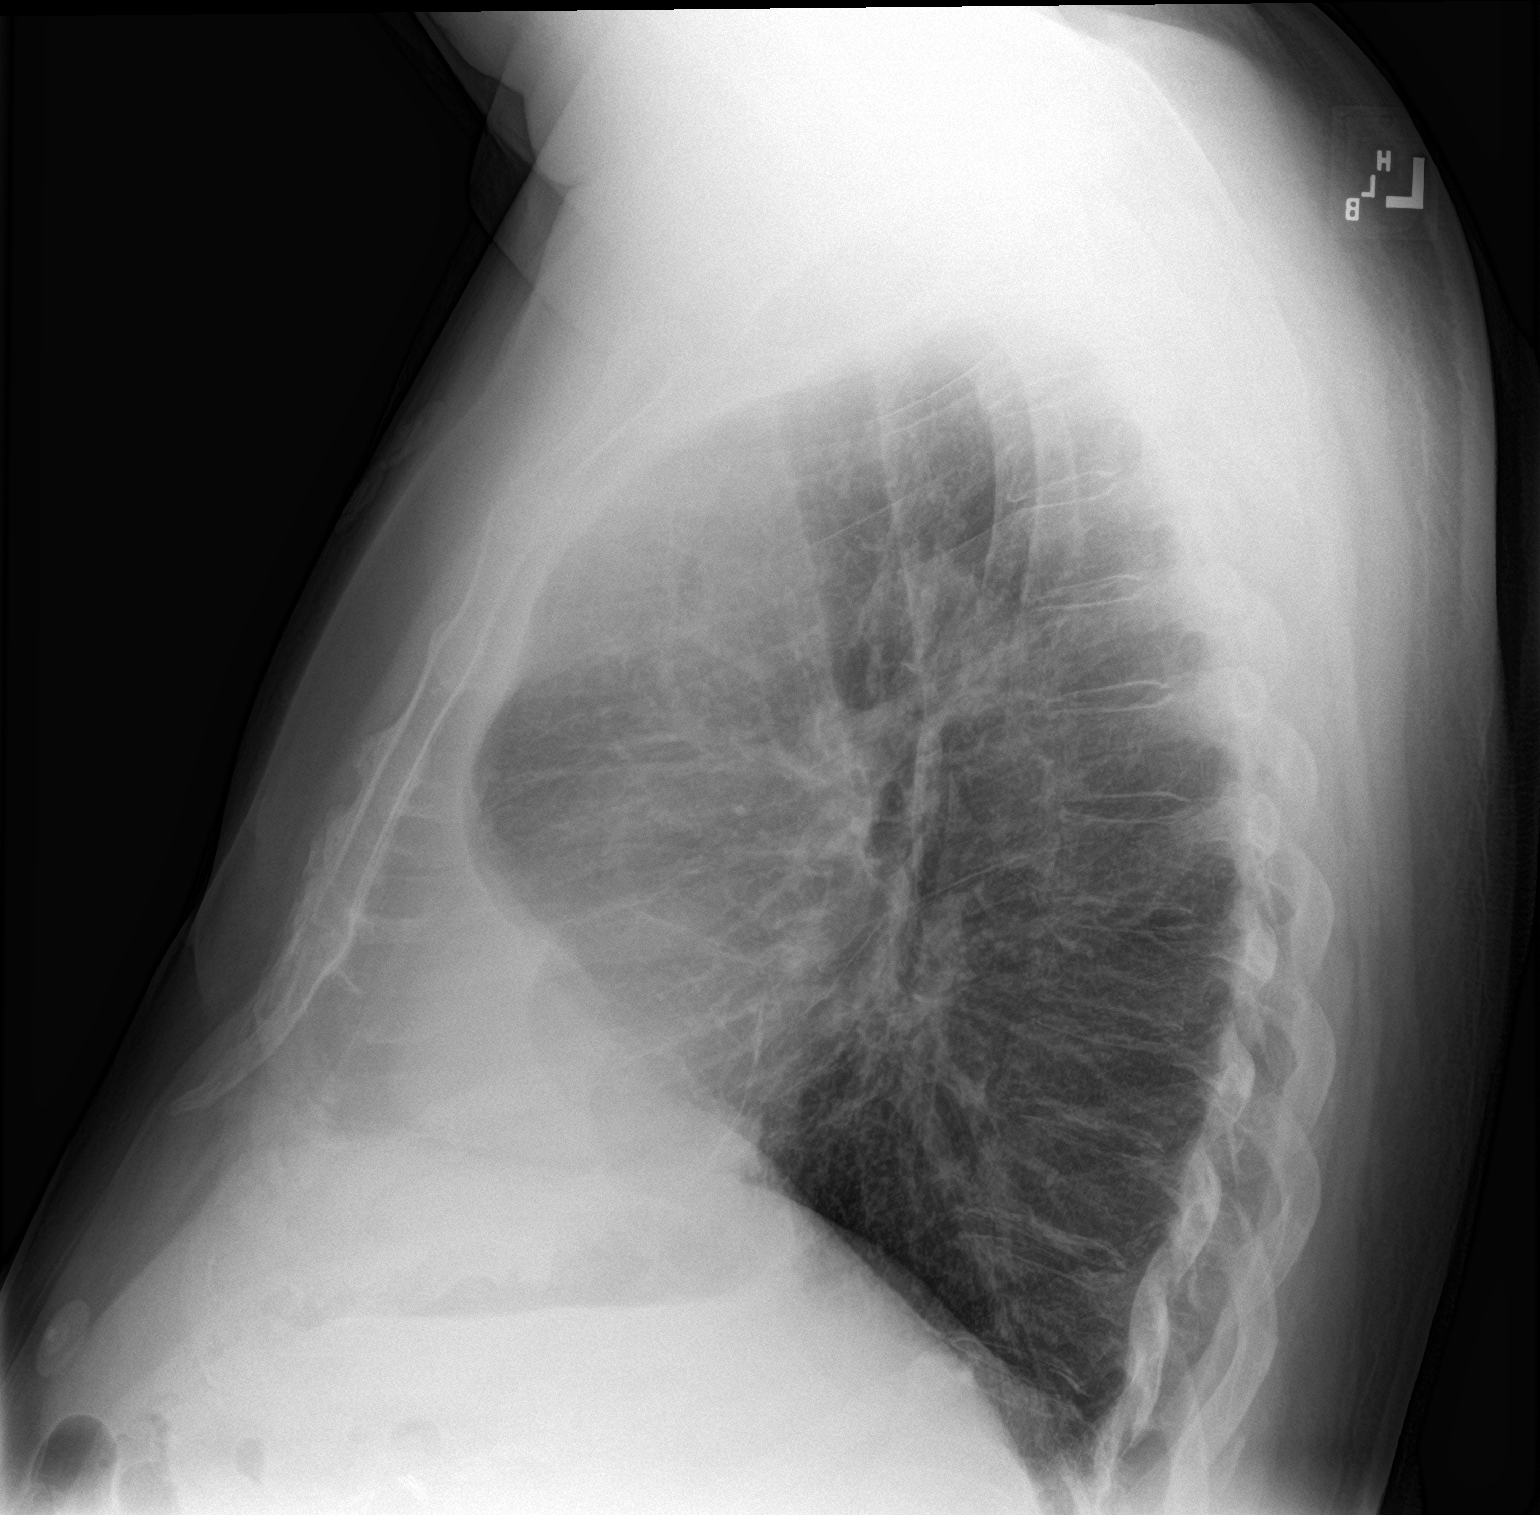

[2 of 2 positions shown; findings below may reference images not displayed]

FINDINGS: Enlargement of cardiac silhouette.

Blunting of the RIGHT costophrenic angle by large fat pad on prior
CT.

Mediastinal contours and pulmonary vascularity otherwise normal.

Chronic interstitial prominence in the lower lungs bilaterally,
greater on RIGHT, question related to chronic interstitial lung
disease from sarcoidosis, unchanged since [DATE].

No definite acute infiltrate, pleural effusion or pneumothorax.

Osseous structures unremarkable.
IMPRESSION: Chronic basilar interstitial disease greater on RIGHT.

Mild enlargement of cardiac silhouette with prominent RIGHT
epicardial fat pad.

No acute abnormalities.

## 2018-10-03 MED ORDER — MONTELUKAST SODIUM 10 MG PO TABS: 10.0000 mg | ORAL_TABLET | Freq: Every day | ORAL | Status: DC | PRN

## 2018-10-03 MED ORDER — POTASSIUM CHLORIDE CRYS ER 20 MEQ PO TBCR
20.0000 meq | EXTENDED_RELEASE_TABLET | Freq: Once | ORAL | Status: AC
  Administered 2018-10-03: 20 meq via ORAL
  Filled 2018-10-03: qty 1

## 2018-10-03 MED ORDER — INSULIN ASPART 100 UNIT/ML ~~LOC~~ SOLN
0.0000 [IU] | SUBCUTANEOUS | Status: DC
  Administered 2018-10-04 (×3): 3 [IU] via SUBCUTANEOUS
  Administered 2018-10-04: 1 [IU] via SUBCUTANEOUS

## 2018-10-03 MED ORDER — ENOXAPARIN SODIUM 30 MG/0.3ML ~~LOC~~ SOLN
30.0000 mg | SUBCUTANEOUS | Status: DC
  Administered 2018-10-03 – 2018-10-05 (×3): 30 mg via SUBCUTANEOUS
  Filled 2018-10-03 (×3): qty 0.3

## 2018-10-03 MED ORDER — SODIUM CHLORIDE 0.9 % IV SOLN
INTRAVENOUS | Status: AC
  Administered 2018-10-03: 23:00:00 via INTRAVENOUS

## 2018-10-03 MED ORDER — ACETAMINOPHEN 650 MG RE SUPP
650.0000 mg | Freq: Four times a day (QID) | RECTAL | Status: DC | PRN
  Filled 2018-10-03: qty 1

## 2018-10-03 MED ORDER — LEVOTHYROXINE SODIUM 50 MCG PO TABS
50.0000 ug | ORAL_TABLET | Freq: Every day | ORAL | Status: DC
  Administered 2018-10-04 – 2018-10-07 (×4): 50 ug via ORAL
  Filled 2018-10-03 (×4): qty 1

## 2018-10-03 MED ORDER — INSULIN GLARGINE 100 UNIT/ML ~~LOC~~ SOLN
20.0000 [IU] | Freq: Every day | SUBCUTANEOUS | Status: DC
  Administered 2018-10-04 – 2018-10-05 (×3): 20 [IU] via SUBCUTANEOUS
  Filled 2018-10-03 (×4): qty 0.2

## 2018-10-03 MED ORDER — ROSUVASTATIN CALCIUM 5 MG PO TABS
10.0000 mg | ORAL_TABLET | Freq: Every day | ORAL | Status: DC
  Administered 2018-10-03 – 2018-10-06 (×4): 10 mg via ORAL
  Filled 2018-10-03 (×4): qty 2

## 2018-10-03 MED ORDER — CLOPIDOGREL BISULFATE 75 MG PO TABS
75.0000 mg | ORAL_TABLET | Freq: Every day | ORAL | Status: DC
  Administered 2018-10-04 – 2018-10-07 (×4): 75 mg via ORAL
  Filled 2018-10-03 (×4): qty 1

## 2018-10-03 MED ORDER — ALBUTEROL SULFATE (2.5 MG/3ML) 0.083% IN NEBU: 2.5000 mg | INHALATION_SOLUTION | Freq: Four times a day (QID) | RESPIRATORY_TRACT | Status: DC | PRN

## 2018-10-03 MED ORDER — PANTOPRAZOLE SODIUM 40 MG PO TBEC
40.0000 mg | DELAYED_RELEASE_TABLET | Freq: Every day | ORAL | Status: DC
  Administered 2018-10-04 – 2018-10-06 (×3): 40 mg via ORAL
  Filled 2018-10-03 (×3): qty 1

## 2018-10-03 MED ORDER — PREDNISONE 10 MG PO TABS
10.0000 mg | ORAL_TABLET | Freq: Every day | ORAL | Status: DC
  Administered 2018-10-04 – 2018-10-07 (×4): 10 mg via ORAL
  Filled 2018-10-03 (×4): qty 1

## 2018-10-03 MED ORDER — ASPIRIN EC 81 MG PO TBEC
81.0000 mg | DELAYED_RELEASE_TABLET | Freq: Every evening | ORAL | Status: DC
  Administered 2018-10-03 – 2018-10-06 (×4): 81 mg via ORAL
  Filled 2018-10-03 (×4): qty 1

## 2018-10-03 MED ORDER — ONDANSETRON HCL 4 MG/2ML IJ SOLN
4.0000 mg | Freq: Four times a day (QID) | INTRAMUSCULAR | Status: DC | PRN
  Administered 2018-10-04 – 2018-10-06 (×2): 4 mg via INTRAVENOUS
  Filled 2018-10-03 (×2): qty 2

## 2018-10-03 MED ORDER — ONDANSETRON HCL 4 MG PO TABS: 4.0000 mg | ORAL_TABLET | Freq: Four times a day (QID) | ORAL | Status: DC | PRN

## 2018-10-03 MED ORDER — HYDROCODONE-ACETAMINOPHEN 5-325 MG PO TABS: 1.0000 | ORAL_TABLET | ORAL | Status: DC | PRN

## 2018-10-03 MED ORDER — ACETAMINOPHEN 325 MG PO TABS
650.0000 mg | ORAL_TABLET | Freq: Four times a day (QID) | ORAL | Status: DC | PRN
  Administered 2018-10-04: 650 mg via ORAL
  Filled 2018-10-03 (×2): qty 2

## 2018-10-03 NOTE — ED Notes (Signed)
This RN sent req to Lab to add on urine Creatinine and Sodium-Monique,RN

## 2018-10-03 NOTE — Telephone Encounter (Signed)
Spoke with the pt and advised him the recommendations and he verbalized understanding and agreed and is having his wife drive him to the [facility] ED.

## 2018-10-03 NOTE — Progress Notes (Signed)
Patient son called about concerns related to visitor policy and why the patient's wife cannot stay at bedside.  Explained the current visitor policy to pt and the only visitors currently allowed are for pts that are very confused and needs someone to sit with them and for pts who are at the end of life/comfort care. Patient's son was not happy with this answer and stated 'what do they have to do to get his mother to be at the bedside'. The patient's son then explained to me how the pt can become confused/ difficult and how it was just a hassle to get pt to the ED. I rounded on the pt and spoke with the RN taking care of the pt. Patient was currently alert and oriented. I then transferred the pt's son to the [facility]. The [facility] called back and told me that he felt it was okay for the wife to be at beside after the son told him that the pt was difficult to get to the hospital and the family believes that the pt will not listen and walk out of the hospital.

## 2018-10-03 NOTE — Telephone Encounter (Addendum)
Reviewed note/hx in Dr. Marlou Porch absence. Given ongoing chest pain, recent stenting, and potential infective symptoms as well, would recommend he proceed to ED for evaluation and also be clear in that he may be having chills/dry cough as well. He would not be a good candidate for in-office visit due to need for troponin levels and infective symptoms, and virtual visit would be insufficient for evaluation of chest pain. From a chest pain standpoint this sounds a little atypical but without an EKG and labs, unable to determine - the ER would be able to evaluate this with a high sensitivity troponin level to either rule in or rule out problem with the stent, and likely needs Covid testing as well. He should proceed ASAP and not drive himself. Dayna Dunn PA-C

## 2018-10-03 NOTE — Telephone Encounter (Signed)
Pt called to report that two weeks after his stent was placed 08/07/18, he started developing a "soreness" on the left side of his chest with SOB associated with it. He says the soreness is constant but does not worsen with movement and he sometimes has a  "sharp pain" across his chest that lasts a few seconds relieved with rest. He says the soreness is worsening over the past week.   Pt says he has had a dry cough no sputum production, he says he was in bed all day yesterday because he felt like somebody has hit him with a bat in his chest. He says he has been shivering with the pain but no fever, although he thought he had a fever yesterday but kept monitoring.   I asked the pt if he has been exposed to anyone that has been sick or known COVID, he says he has not that he knows of.. he has been social distancing.   I will forward his message to the APP for review... not sure of need if person appt vs. Virtual... or any other recommendations.   Pt has not seen his PCP Dr. Kenton Kingfisher for this problem.

## 2018-10-03 NOTE — Progress Notes (Signed)
Patient states wife is coming up to stay with patient. Pt called to left wife and son know that he was ok by himself, but states his son is stubborn and wants the wife here anyway. Charge nurse talked to family member and the hospital [facility].

## 2018-10-03 NOTE — ED Notes (Signed)
Patient transported to Ultrasound 

## 2018-10-03 NOTE — ED Notes (Signed)
ED TO INPATIENT HANDOFF REPORT  ED Nurse Name and Phone #: [name] [phone]  S Name/Age/Gender [name] 73 y.o. male Room/Bed: 053C/053C  Code Status   Code Status: Prior  Home/SNF/Other Home Patient oriented to: self, place, time and situation Is this baseline? Yes   Triage Complete: Triage complete  Chief Complaint CP  Triage Note Patient came in from home with complaints of chest discomfort for about 3 weeks. Patient has had weakness and some nausea as well. Tried to reach out to primary and they referred him to the ED. Patient had two heart stents placed 4 weeks ago.   Allergies Allergies  Allergen Reactions  . Hydrocortisone Nausea Only  . Metolazone     Drained, no energy  . Statins Other (See Comments)    Pt says they make him "mean"    Level of Care/Admitting Diagnosis ED Disposition    ED Disposition Condition Baker: [place] [[id number]]  Level of Care: Telemetry Medical [104]  I expect the patient will be discharged within 24 hours: Yes  LOW acuity---Tx typically complete <24 hrs---ACUTE conditions typically can be evaluated <24 hours---LABS likely to return to acceptable levels <24 hours---IS near functional baseline---EXPECTED to return to current living arrangement---NOT newly hypoxic: Meets criteria for 5C-Observation unit  Covid Evaluation: Confirmed COVID Negative  Diagnosis: AKI (acute kidney injury) [facility]) [[id number]]  Admitting Physician: Toy Baker [[id number]]  Attending Physician: Toy Baker [[id number]]  PT Class (Do Not Modify): Observation [104]  PT Acc Code (Do Not Modify): Observation [10022]       B Medical/Surgery History Past Medical History:  Diagnosis Date  . Adrenal insufficiency ([place])   . Allergy   . Anemia   . Arthritis    feet   . CKD (chronic kidney disease), stage III ([place])   . Coronary artery disease    has stents  . Coronary atherosclerosis of native coronary  artery    Proximal LAD, posterior lateral stent widely patent-10/12/11  . Diabetes mellitus    insulin and pills  . Hearing loss    wears hearing aids  . Heart attack ([place]) 2010  . History of blood transfusion 06/30/2016   [facility] - 2 units transfused  . Hypertension   . OSA (obstructive sleep apnea)    uses VPAC sleep study 2 years done through [place]. Dr. Marlou Porch arranged study  . Pneumonia    3-4 years ago  . Sarcoid   . Sleep apnea    uses CPAP nightly  . Thyroid disease   . Type 2 diabetes mellitus ([place])    Past Surgical History:  Procedure Laterality Date  . APPENDECTOMY    . CATARACT EXTRACTION  2011   bilat  . CHOLECYSTECTOMY  01/25/2011   Procedure: LAPAROSCOPIC CHOLECYSTECTOMY WITH INTRAOPERATIVE CHOLANGIOGRAM;  Surgeon: Judieth Keens, DO;  Location: [place];  Service: General;  Laterality: N/A;  . COLONOSCOPY     several  . CORONARY ANGIOPLASTY     most recent 11/2009  . CORONARY STENT INTERVENTION N/A 08/07/2018   Procedure: CORONARY STENT INTERVENTION;  Surgeon: Jettie Booze, MD;  Location: [place] CV LAB;  Service: Cardiovascular;  Laterality: N/A;  . CORONARY STENT PLACEMENT  2009   in LAD and side branch PTCA  . INTERCOSTAL NERVE BLOCK  2011, 06/2016   x2. lumbar spine  . LEFT HEART CATHETERIZATION WITH CORONARY ANGIOGRAM Bilateral 10/12/2011   Procedure: LEFT HEART CATHETERIZATION WITH CORONARY ANGIOGRAM;  Surgeon:  Candee Furbish, MD;  Location: [facility] CATH LAB;  Service: Cardiovascular;  Laterality: Bilateral;  . LEFT HEART CATHETERIZATION WITH CORONARY ANGIOGRAM N/A 04/01/2014   Procedure: LEFT HEART CATHETERIZATION WITH CORONARY ANGIOGRAM;  Surgeon: Candee Furbish, MD;  Location: [facility] CATH LAB;  Service: Cardiovascular;  Laterality: N/A;  . RIGHT/LEFT HEART CATH AND CORONARY ANGIOGRAPHY N/A 08/07/2018   Procedure: RIGHT/LEFT HEART CATH AND CORONARY ANGIOGRAPHY;  Surgeon: Jettie Booze, MD;  Location: [place] CV LAB;  Service: Cardiovascular;   Laterality: N/A;     A IV Location/Drains/Wounds Patient Lines/Drains/Airways Status   Active Line/Drains/Airways    Name:   Placement date:   Placement time:   Site:   Days:   Peripheral IV 08/07/18 Right Antecubital   08/07/18    0750    Antecubital   57   Peripheral IV 10/03/18 Right Wrist   10/03/18    1902    Wrist   less than 1   Closed/Suction Drain  Right;Lateral Abdomen Bulb (JP) 19 Fr.   01/25/11    1016    Abdomen   2808   Incision 01/25/11 Abdomen Other (Comment)   01/25/11    1000     2808   Incision - 5 Ports Abdomen Mid Umbilicus Right;Lateral Left;Lateral Right;Medial   01/25/11    0808     2808          Intake/Output Last 24 hours  Intake/Output Summary (Last 24 hours) at 10/03/2018 2145 Last data filed at 10/03/2018 1640 Gross per 24 hour  Intake 0 ml  Output 0 ml  Net 0 ml    Labs/Imaging Results for orders placed or performed during the hospital encounter of 10/03/18 (from the past 48 hour(s))  Basic metabolic panel     Status: Abnormal   Collection Time: 10/03/18  2:30 PM  Result Value Ref Range   Sodium 138 135 - 145 mmol/L   Potassium 3.4 (L) 3.5 - 5.1 mmol/L   Chloride 99 98 - 111 mmol/L   CO2 21 (L) 22 - 32 mmol/L   Glucose, Bld 241 (H) 70 - 99 mg/dL   BUN 41 (H) 8 - 23 mg/dL   Creatinine, Ser 3.24 (H) 0.61 - 1.24 mg/dL   Calcium 8.8 (L) 8.9 - 10.3 mg/dL   GFR calc non Af Amer 18 (L) >60 mL/min   GFR calc Af Amer 21 (L) >60 mL/min   Anion gap 18 (H) 5 - 15    Comment: Performed at [facility], [address] [place], [place] [address]  CBC     Status: Abnormal   Collection Time: 10/03/18  2:30 PM  Result Value Ref Range   WBC 14.5 (H) 4.0 - 10.5 K/uL   RBC 3.74 (L) 4.22 - 5.81 MIL/uL   Hemoglobin 11.6 (L) 13.0 - 17.0 g/dL   HCT 36.4 (L) 39.0 - 52.0 %   MCV 97.3 80.0 - 100.0 fL   MCH 31.0 26.0 - 34.0 pg   MCHC 31.9 30.0 - 36.0 g/dL   RDW 13.2 11.5 - 15.5 %   Platelets 339 150 - 400 K/uL   nRBC 0.0 0.0 - 0.2 %    Comment:  Performed at [facility], [place] [address] [place], [place] [address]  Troponin I (High Sensitivity)     Status: None   Collection Time: 10/03/18  2:30 PM  Result Value Ref Range   Troponin I (High Sensitivity) 13 <18 ng/L    Comment: (NOTE) Elevated  high sensitivity troponin I (hsTnI) values and significant  changes across serial measurements may suggest ACS but many other  chronic and acute conditions are known to elevate hsTnI results.  Refer to the "Links" section for chest pain algorithms and additional  guidance. Performed at [facility], [place] [address] [place], [place] [address]   Troponin I (High Sensitivity)     Status: None   Collection Time: 10/03/18  4:28 PM  Result Value Ref Range   Troponin I (High Sensitivity) 15 <18 ng/L    Comment: (NOTE) Elevated high sensitivity troponin I (hsTnI) values and significant  changes across serial measurements may suggest ACS but many other  chronic and acute conditions are known to elevate hsTnI results.  Refer to the "Links" section for chest pain algorithms and additional  guidance. Performed at [facility], [place] [address] [place], [place] [address]   Brain natriuretic peptide     Status: None   Collection Time: 10/03/18  4:43 PM  Result Value Ref Range   B Natriuretic Peptide 42.4 0.0 - 100.0 pg/mL    Comment: Performed at [facility] [address], [place], [place] [address]  SARS Coronavirus 2 (CEPHEID - Performed in [place] hospital lab), Hosp Order     Status: None   Collection Time: 10/03/18  4:50 PM   Specimen: Urine, Clean Catch; Nasopharyngeal  Result Value Ref Range   SARS Coronavirus 2 NEGATIVE NEGATIVE    Comment: (NOTE) If result is NEGATIVE SARS-CoV-2 target nucleic acids are NOT DETECTED. The SARS-CoV-2 RNA is generally detectable in upper and lower  respiratory specimens during the acute phase of infection. The lowest  concentration of SARS-CoV-2 viral copies this assay can  detect is 250  copies / mL. A negative result does not preclude SARS-CoV-2 infection  and should not be used as the sole basis for treatment or other  patient management decisions.  A negative result may occur with  improper specimen collection / handling, submission of specimen other  than nasopharyngeal swab, presence of viral mutation(s) within the  areas targeted by this assay, and inadequate number of viral copies  (<250 copies / mL). A negative result must be combined with clinical  observations, patient history, and epidemiological information. If result is POSITIVE SARS-CoV-2 target nucleic acids are DETECTED. The SARS-CoV-2 RNA is generally detectable in upper and lower  respiratory specimens dur ing the acute phase of infection.  Positive  results are indicative of active infection with SARS-CoV-2.  Clinical  correlation with patient history and other diagnostic information is  necessary to determine patient infection status.  Positive results do  not rule out bacterial infection or co-infection with other viruses. If result is PRESUMPTIVE POSTIVE SARS-CoV-2 nucleic acids MAY BE PRESENT.   A presumptive positive result was obtained on the submitted specimen  and confirmed on repeat testing.  While 2019 novel coronavirus  (SARS-CoV-2) nucleic acids may be present in the submitted sample  additional confirmatory testing may be necessary for epidemiological  and / or clinical management purposes  to differentiate between  SARS-CoV-2 and other Sarbecovirus currently known to infect humans.  If clinically indicated additional testing with an alternate test  methodology [phone]) is advised. The SARS-CoV-2 RNA is generally  detectable in upper and lower respiratory sp ecimens during the acute  phase of infection. The expected result is Negative. Fact Sheet for Patients:  StrictlyIdeas.no Fact Sheet for Healthcare  Providers: BankingDealers.co.za This test is not yet  approved or cleared by the [place] and has been authorized for detection and/or diagnosis of SARS-CoV-2 by FDA under an Emergency Use Authorization (EUA).  This EUA will remain in effect (meaning this test can be used) for the duration of the COVID-19 declaration under Section 564(b)(1) of the Act, 21 U.S.C. section 360bbb-3(b)(1), unless the authorization is terminated or revoked sooner. Performed at [facility], [place] [address] [place], [place] [address]   Urinalysis, Routine w reflex microscopic     Status: Abnormal   Collection Time: 10/03/18  5:20 PM  Result Value Ref Range   Color, Urine YELLOW YELLOW   APPearance CLEAR CLEAR   Specific Gravity, Urine 1.015 1.005 - 1.030   pH 5.0 5.0 - 8.0   Glucose, UA NEGATIVE NEGATIVE mg/dL   Hgb urine dipstick NEGATIVE NEGATIVE   Bilirubin Urine NEGATIVE NEGATIVE   Ketones, ur NEGATIVE NEGATIVE mg/dL   Protein, ur 30 (A) NEGATIVE mg/dL   Nitrite NEGATIVE NEGATIVE   Leukocytes,Ua NEGATIVE NEGATIVE   WBC, UA 0-5 0 - 5 WBC/hpf   Bacteria, UA RARE (A) NONE SEEN   Squamous Epithelial / LPF 0-5 0 - 5   Mucus PRESENT    Hyaline Casts, UA PRESENT     Comment: Performed at [facility], [place] [address], [place], [place] [address]  Hepatic function panel     Status: Abnormal   Collection Time: 10/03/18  7:23 PM  Result Value Ref Range   Total Protein 7.0 6.5 - 8.1 g/dL   Albumin 3.2 (L) 3.5 - 5.0 g/dL   AST 14 (L) 15 - 41 U/L   ALT 16 0 - 44 U/L   Alkaline Phosphatase 52 38 - 126 U/L   Total Bilirubin 0.9 0.3 - 1.2 mg/dL   Bilirubin, Direct 0.2 0.0 - 0.2 mg/dL   Indirect Bilirubin 0.7 0.3 - 0.9 mg/dL    Comment: Performed at [facility] [address], [place], [place] [address]  T4, free     Status: None   Collection Time: 10/03/18  7:23 PM  Result Value Ref Range   Free T4 0.98 0.61 - 1.12 ng/dL    Comment: (NOTE) Biotin ingestion  may interfere with free T4 tests. If the results are inconsistent with the TSH level, previous test results, or the clinical presentation, then consider biotin interference. If needed, order repeat testing after stopping biotin. Performed at [facility], [place] [address] [place],  [address]   CBC with Differential/Platelet     Status: Abnormal   Collection Time: 10/03/18  7:23 PM  Result Value Ref Range   WBC 13.6 (H) 4.0 - 10.5 K/uL   RBC 3.57 (L) 4.22 - 5.81 MIL/uL   Hemoglobin 11.2 (L) 13.0 - 17.0 g/dL   HCT 34.7 (L) 39.0 - 52.0 %   MCV 97.2 80.0 - 100.0 fL   MCH 31.4 26.0 - 34.0 pg   MCHC 32.3 30.0 - 36.0 g/dL   RDW 13.2 11.5 - 15.5 %   Platelets 278 150 - 400 K/uL   nRBC 0.0 0.0 - 0.2 %   Neutrophils Relative % 74 %   Neutro Abs 10.4 (H) 1.7 - 7.7 K/uL   Lymphocytes Relative 12 %   Lymphs Abs 1.6 0.7 - 4.0 K/uL   Monocytes Relative 9 %   Monocytes Absolute 1.2 (H) 0.1 - 1.0 K/uL   Eosinophils Relative 2 %   Eosinophils Absolute 0.2 0.0 - 0.5 K/uL  Basophils Relative 1 %   Basophils Absolute 0.1 0.0 - 0.1 K/uL   Immature Granulocytes 2 %   Abs Immature Granulocytes 0.24 (H) 0.00 - 0.07 K/uL    Comment: Performed at [facility], [place] [address], [place], [place] [address]  Magnesium     Status: None   Collection Time: 10/03/18  7:23 PM  Result Value Ref Range   Magnesium 1.8 1.7 - 2.4 mg/dL    Comment: Performed at [facility], [place] [address] [place], [place] [address]  Phosphorus     Status: Abnormal   Collection Time: 10/03/18  7:23 PM  Result Value Ref Range   Phosphorus 4.8 (H) 2.5 - 4.6 mg/dL    Comment: Performed at [facility] [address] [place], [place] [address]  TSH     Status: None   Collection Time: 10/03/18  7:23 PM  Result Value Ref Range   TSH 2.678 0.350 - 4.500 uIU/mL    Comment: Performed by a 3rd Generation assay with a functional sensitivity of <=0.01 uIU/mL. Performed at [facility], [place]  [address], [place], [place] [address]   Cortisol     Status: None   Collection Time: 10/03/18  7:29 PM  Result Value Ref Range   Cortisol, Plasma 6.6 ug/dL    Comment: (NOTE) AM    6.7 - 22.6 ug/dL PM   <10.0       ug/dL Performed at [facility] [address], [place],  [address]    Dg Chest 2 View  Result Date: 10/03/2018 CLINICAL DATA:  Chest pain for 4 weeks, coronary stent put in 4 weeks ago, diabetes mellitus, coronary artery disease post MI, hypertension, sarcoid EXAM: CHEST - 2 VIEW COMPARISON:  06/15/2016, 06/25/2015 Correlation: CT chest 02/20/2014 FINDINGS: Enlargement of cardiac silhouette. Blunting of the RIGHT costophrenic angle by large fat pad on prior CT. Mediastinal contours and pulmonary vascularity otherwise normal. Chronic interstitial prominence in the lower lungs bilaterally, greater on RIGHT, question related to chronic interstitial lung disease from sarcoidosis, unchanged since 06/25/2015. No definite acute infiltrate, pleural effusion or pneumothorax. Osseous structures unremarkable. IMPRESSION: Chronic basilar interstitial disease greater on RIGHT. Mild enlargement of cardiac silhouette with prominent RIGHT epicardial fat pad. No acute abnormalities. Electronically Signed   By: Lavonia Dana M.D.   On: 10/03/2018 15:14   US Renal  Result Date: 10/03/2018 CLINICAL DATA:  Renal failure.  Chronicity not specified. EXAM: RENAL / URINARY TRACT ULTRASOUND COMPLETE COMPARISON:  CT 04/20/2018 FINDINGS: Right Kidney: Renal measurements: 12.4 x 5.1 x 5.7 cm = volume: 188.7 mL. Echogenicity within normal limits. Two small cyst in the right kidney measuring 1.2 and 1.3 cm respectively. No solid mass or hydronephrosis visualized. Left Kidney: Renal measurements: 11.8 x 5.8 x 6.4 cm = volume: 227 mL. Echogenicity within normal limits. No mass or hydronephrosis visualized. Bladder: Appears normal for degree of bladder distention. IMPRESSION: Unremarkable renal ultrasound.  No  obstructive uropathy. Incidental small right renal cysts. Electronically Signed   By: Keith Rake M.D.   On: 10/03/2018 21:23    Pending Labs Unresulted Labs (From admission, onward)    Start     Ordered   10/03/18 1931  Sodium, urine, random  Once,   AD     10/03/18 1930   10/03/18 1931  Creatinine, urine, random  Once,   AD     10/03/18 1930   10/03/18 1929  T3  Once,  STAT     10/03/18 1928   10/03/18 1928  Angiotensin converting enzyme  Once,   STAT     10/03/18 1927   Signed and Held  Hemoglobin A1c  Tomorrow morning,   R    Comments: To assess prior glycemic control    Signed and Held   Signed and Held  Vitamin B12  (Anemia Panel (PNL))  Tomorrow morning,   R     Signed and Held   Signed and Held  Folate  (Anemia Panel (PNL))  Tomorrow morning,   R     Signed and Held   Signed and Held  Iron and TIBC  (Anemia Panel (PNL))  Tomorrow morning,   R     Signed and Held   Signed and Held  Ferritin  (Anemia Panel (PNL))  Tomorrow morning,   R     Signed and Held   Signed and Held  Reticulocytes  (Anemia Panel (PNL))  Tomorrow morning,   R     Signed and Held   Signed and Held  Magnesium  Tomorrow morning,   R    Comments: Call MD if <1.5    Signed and Held   Signed and Held  Phosphorus  Tomorrow morning,   R     Signed and Held   Signed and Held  Comprehensive metabolic panel  Once,   R    Comments: Cal MD for K<3.5 or >5.0    Signed and Held   Signed and Held  CBC  Once,   R    Comments: Call for hg <8.0    Signed and Held   Signed and Held  Prealbumin  Tomorrow morning,   R     Signed and Held          Vitals/Pain Today's Vitals   10/03/18 1901 10/03/18 2022 10/03/18 2030 10/03/18 2130  BP: (!) 122/53 140/66 (!) 141/70   Pulse: 65 71    Resp: 14 18 20    Temp:  98 F (36.7 C)    TempSrc:  Oral    SpO2: 100% 100%    Weight:      Height:      PainSc:    0-No pain    Isolation Precautions No active isolations  Medications Medications - No data to  display  Mobility walks Low fall risk   Focused Assessments Cardiac Assessment Handoff:  Cardiac Rhythm: Normal sinus rhythm Lab Results  Component Value Date   CKTOTAL 82 11/09/2010   CKMB 2.2 11/09/2010   TROPONINI <0.30 11/09/2010   Lab Results  Component Value Date   DDIMER 1.08 (H) 11/09/2010   Does the Patient currently have chest pain? No     R Recommendations: See Admitting Provider Note  Report given to:   Additional Notes: Patient has been on 2 L/M 02 in ED but denies use at home only was "winded" trying to ambulate into ED on arrival; Pt A&O x 4 w/ no complaints at this time-Monique,RN

## 2018-10-03 NOTE — ED Provider Notes (Signed)
[facility] EMERGENCY DEPARTMENT Provider Note   CSN: [id number] Arrival date & time: 10/03/18  1407    History   Chief Complaint Chief Complaint  Patient presents with  . Chest Pain    HPI [name] is a 73 y.o. male hx of CKD, CAD, hypertension, recent cardiac stents, here presenting with chest pressure, shortness of breath.  Patient had a cardiac stent placed about 2 months ago.  Initially he felt better but then he states that his shortness of breath returned and persistent leg swelling.  Patient states that he is taking his Lasix 80 mg twice daily still.  He called his cardiologist and was sent here for evaluation.  Patient states that he stays at home and has no known COVID contacts.     The history is provided by the patient.    Past Medical History:  Diagnosis Date  . Adrenal insufficiency ([place])   . Allergy   . Anemia   . Arthritis    feet   . CKD (chronic kidney disease), stage III ([place])   . Coronary artery disease    has stents  . Coronary atherosclerosis of native coronary artery    Proximal LAD, posterior lateral stent widely patent-10/12/11  . Diabetes mellitus    insulin and pills  . Hearing loss    wears hearing aids  . Heart attack ([place]) 2010  . History of blood transfusion 06/30/2016   [facility] - 2 units transfused  . Hypertension   . OSA (obstructive sleep apnea)    uses VPAC sleep study 2 years done through [facility]. Dr. Marlou Porch arranged study  . Pneumonia    3-4 years ago  . Sarcoid   . Sleep apnea    uses CPAP nightly  . Thyroid disease   . Type 2 diabetes mellitus [facility])     Patient Active Problem List   Diagnosis Date Noted  . CKD (chronic kidney disease), stage III ([place])   . Iron deficiency anemia   . Symptomatic anemia 06/29/2016  . Chronic diastolic heart failure ([place]) 01/14/2014  . Stable angina ([place]) 01/14/2014  . Obesity 01/14/2014  . Pulmonary sarcoidosis ([place]) 01/14/2014  . Uncontrolled diabetes mellitus  ([place]) 01/14/2014  . Hyperlipidemia 01/14/2014  . Coronary atherosclerosis of native coronary artery   . Sinusitis, chronic 05/30/2011  . Sleep apnea 10/09/2008  . ADRENAL INSUFFICIENCY, HX OF 02/21/2008  . Coronary artery disease 11/20/2007  . Sarcoidosis 02/20/2007  . Diabetes mellitus without complication ([place]) 46/96/2952  . GERD 12/20/2006    Past Surgical History:  Procedure Laterality Date  . APPENDECTOMY    . CATARACT EXTRACTION  2011   bilat  . CHOLECYSTECTOMY  01/25/2011   Procedure: LAPAROSCOPIC CHOLECYSTECTOMY WITH INTRAOPERATIVE CHOLANGIOGRAM;  Surgeon: Judieth Keens, DO;  Location: [facility];  Service: General;  Laterality: N/A;  . COLONOSCOPY     several  . CORONARY ANGIOPLASTY     most recent 11/2009  . CORONARY STENT INTERVENTION N/A 08/07/2018   Procedure: CORONARY STENT INTERVENTION;  Surgeon: Jettie Booze, MD;  Location: [facility] CV LAB;  Service: Cardiovascular;  Laterality: N/A;  . CORONARY STENT PLACEMENT  2009   in LAD and side branch PTCA  . INTERCOSTAL NERVE BLOCK  2011, 06/2016   x2. lumbar spine  . LEFT HEART CATHETERIZATION WITH CORONARY ANGIOGRAM Bilateral 10/12/2011   Procedure: LEFT HEART CATHETERIZATION WITH CORONARY ANGIOGRAM;  Surgeon: Candee Furbish, MD;  Location: [facility] CATH LAB;  Service: Cardiovascular;  Laterality: Bilateral;  .  LEFT HEART CATHETERIZATION WITH CORONARY ANGIOGRAM N/A 04/01/2014   Procedure: LEFT HEART CATHETERIZATION WITH CORONARY ANGIOGRAM;  Surgeon: Candee Furbish, MD;  Location: [facility] CATH LAB;  Service: Cardiovascular;  Laterality: N/A;  . RIGHT/LEFT HEART CATH AND CORONARY ANGIOGRAPHY N/A 08/07/2018   Procedure: RIGHT/LEFT HEART CATH AND CORONARY ANGIOGRAPHY;  Surgeon: Jettie Booze, MD;  Location: [facility] CV LAB;  Service: Cardiovascular;  Laterality: N/A;        Home Medications    Prior to Admission medications   Medication Sig Start Date End Date Taking? Authorizing Provider  acetaminophen (TYLENOL) 500 MG  tablet Take 500-1,000 mg by mouth daily as needed for moderate pain or headache.     [provider]  albuterol (PROVENTIL) (2.5 MG/3ML) 0.083% nebulizer solution Take 3 mLs (2.5 mg total) by nebulization every 6 (six) hours as needed for wheezing or shortness of breath. Dx Code D86.0 07/24/14   Elsie Stain, MD  Albuterol Sulfate (PROAIR RESPICLICK) 989 (90 BASE) MCG/ACT AEPB Inhale 2 puffs into the lungs every 6 (six) hours as needed. 07/24/14   Elsie Stain, MD  aspirin EC 81 MG tablet Take 81 mg by mouth every evening.     [provider]  Cholecalciferol (VITAMIN D3) 3000 UNITS TABS Take 3,000 Units by mouth daily.    [provider]  clopidogrel (PLAVIX) 75 MG tablet Take 1 tablet (75 mg total) by mouth daily. 04/17/14   Jerline Pain, MD  Coenzyme Q10 (COQ-10) 400 MG CAPS Take 400 mg by mouth every evening.    [provider]  fish oil-omega-3 fatty acids 1000 MG capsule Take 1 g by mouth daily.     [provider]  furosemide (LASIX) 80 MG tablet Take 80 mg by mouth See admin instructions. Take 80 mg twice daily, may take a third 80 mg dose as needed for swelling    [provider]  glipiZIDE (GLUCOTROL) 10 MG tablet Take 10 mg by mouth every evening.     [provider]  insulin aspart protamine-insulin aspart (NOVOLOG 70/30) (70-30) 100 UNIT/ML injection Inject 40-45 Units into the skin 3 (three) times daily as needed (CBG >100).     [provider]  levothyroxine (SYNTHROID, LEVOTHROID) 50 MCG tablet Take 50 mcg by mouth daily.  06/21/16   [provider]  losartan (COZAAR) 100 MG tablet Take 100 mg by mouth daily.    [provider]  montelukast (SINGULAIR) 10 MG tablet Take 10 mg by mouth daily as needed (allergies).  07/18/18   [provider]  pantoprazole (PROTONIX) 40 MG tablet Take 40 mg by mouth daily. 03/21/18   [provider]  potassium chloride (KLOR-CON M10) 10 MEQ  tablet Take 2 tablets (20 mEq total) by mouth 2 (two) times daily. 03/26/14   Elsie Stain, MD  predniSONE (DELTASONE) 10 MG tablet Take 1 tablet (10 mg total) by mouth daily with breakfast. T 05/15/15   Parrett, Tammy S, NP  RABEprazole (ACIPHEX) 20 MG tablet Take 1 tablet (20 mg total) by mouth 2 (two) times daily as needed. 12/07/15   Rigoberto Noel, MD  rosuvastatin (CRESTOR) 10 MG tablet Take 1 tablet (10 mg total) by mouth at bedtime. 08/24/18   Jerline Pain, MD  sodium chloride (OCEAN) 0.65 % SOLN nasal spray Place 1 spray into both nostrils as needed for congestion.    [provider]  Vitamin D, Ergocalciferol, (DRISDOL) 50000 UNITS CAPS capsule Take 50,000 Units by  mouth every Sunday.  12/21/13   [provider]    Family History Family History  Problem Relation Age of Onset  . Kidney disease Mother   . Diabetes Mother   . Kidney cancer Mother   . Heart attack Father   . Asthma Sister   . Anesthesia problems Sister        "Kidney's did not wake up"  . Colon cancer Neg Hx   . Colon polyps Neg Hx   . Rectal cancer Neg Hx   . Stomach cancer Neg Hx     Social History Social History   Tobacco Use  . Smoking status: Never Smoker  . Smokeless tobacco: Never Used  Substance Use Topics  . Alcohol use: No  . Drug use: No     Allergies   Hydrocortisone, Metolazone, and Statins   Review of Systems Review of Systems  Cardiovascular: Positive for chest pain.     Physical Exam Updated Vital Signs BP (!) 122/53 (BP Location: Left Arm)   Pulse 65   Temp 99.3 F (37.4 C) (Oral)   Resp 14   Ht 5\' 10"  (1.778 m)   Wt 108 kg   SpO2 100%   BMI 34.15 kg/m   Physical Exam Vitals signs reviewed.  HENT:     Head: Normocephalic.  Eyes:     Pupils: Pupils are equal, round, and reactive to light.  Neck:     Musculoskeletal: Normal range of motion and neck supple.  Cardiovascular:     Rate and Rhythm: Normal rate and regular rhythm.     Heart  sounds: Normal heart sounds.  Pulmonary:     Effort: Pulmonary effort is normal.     Breath sounds: Normal breath sounds.  Abdominal:     General: Bowel sounds are normal.     Palpations: Abdomen is soft.  Musculoskeletal: Normal range of motion.     Comments: 1+ edema bilaterally   Skin:    General: Skin is warm.     Capillary Refill: Capillary refill takes less than 2 seconds.  Neurological:     General: No focal deficit present.     Mental Status: He is alert and oriented to person, place, and time.  Psychiatric:        Mood and Affect: Mood normal.        Behavior: Behavior normal.      ED Treatments / Results  Labs (all labs ordered are listed, but only abnormal results are displayed) Labs Reviewed  BASIC METABOLIC PANEL - Abnormal; Notable for the following components:      Result Value   Potassium 3.4 (*)    CO2 21 (*)    Glucose, Bld 241 (*)    BUN 41 (*)    Creatinine, Ser 3.24 (*)    Calcium 8.8 (*)    GFR calc non Af Amer 18 (*)    GFR calc Af Amer 21 (*)    Anion gap 18 (*)    All other components within normal limits  CBC - Abnormal; Notable for the following components:   WBC 14.5 (*)    RBC 3.74 (*)    Hemoglobin 11.6 (*)    HCT 36.4 (*)    All other components within normal limits  URINALYSIS, ROUTINE W REFLEX MICROSCOPIC - Abnormal; Notable for the following components:   Protein, ur 30 (*)    Bacteria, UA RARE (*)    All other components within normal limits  SARS CORONAVIRUS 2 (  HOSPITAL ORDER, [facility])  BRAIN NATRIURETIC PEPTIDE  TROPONIN I (HIGH SENSITIVITY)  TROPONIN I (HIGH SENSITIVITY)    EKG EKG Interpretation  Date/Time:  Wednesday October 03 2018 14:17:40 EDT Ventricular Rate:  101 PR Interval:  160 QRS Duration: 100 QT Interval:  350 QTC Calculation: 453 R Axis:   -61 Text Interpretation:  Sinus tachycardia Left anterior fascicular block Anterior infarct , age undetermined Abnormal ECG No significant  change since last tracing Confirmed by Wandra Arthurs [phone]) on 10/03/2018 4:31:34 PM   Radiology Dg Chest 2 View  Result Date: 10/03/2018 CLINICAL DATA:  Chest pain for 4 weeks, coronary stent put in 4 weeks ago, diabetes mellitus, coronary artery disease post MI, hypertension, sarcoid EXAM: CHEST - 2 VIEW COMPARISON:  06/15/2016, 06/25/2015 Correlation: CT chest 02/20/2014 FINDINGS: Enlargement of cardiac silhouette. Blunting of the RIGHT costophrenic angle by large fat pad on prior CT. Mediastinal contours and pulmonary vascularity otherwise normal. Chronic interstitial prominence in the lower lungs bilaterally, greater on RIGHT, question related to chronic interstitial lung disease from sarcoidosis, unchanged since 06/25/2015. No definite acute infiltrate, pleural effusion or pneumothorax. Osseous structures unremarkable. IMPRESSION: Chronic basilar interstitial disease greater on RIGHT. Mild enlargement of cardiac silhouette with prominent RIGHT epicardial fat pad. No acute abnormalities. Electronically Signed   By: Lavonia Dana M.D.   On: 10/03/2018 15:14    Procedures Procedures (including critical care time)    EMERGENCY DEPARTMENT [place] CARDIAC EXAM "Study: Limited Ultrasound of the Heart and Pericardium"  INDICATIONS:Chest pain Multiple views of the heart and pericardium were obtained in real-time with a multi-frequency probe.  PERFORMED LN:[id number] IMAGES ARCHIVED?: Yes LIMITATIONS:  Body habitus VIEWS USED: Subcostal 4 chamber and Parasternal long axis INTERPRETATION: Cardiac activity present and Pericardial effusioin absent    Medications Ordered in ED Medications - No data to display   Initial Impression / Assessment and Plan / ED Course  I have reviewed the triage vital signs and the nursing notes.  Pertinent labs & imaging results that were available during my care of the patient were reviewed by me and considered in my medical decision making (see chart for details).        RAWN QUIROA is a 73 y.o. male here with SOB. Had recent cardiac stent. Consider ACS vs CHF. Appears volume overloaded so consider renal failure as well. Will get labs, BNP, UA, CXR.   7:20 PM CXR clear. Cr elevated 3.5, which is double compared to previous. Will admit for AKI. Still has peripheral edema so will not give IVF. Will likely need to hold lasix and trend kidney function in the hospital. Hospitalist recommend Renal US which was ordered and pending     Final Clinical Impressions(s) / ED Diagnoses   Final diagnoses:  None    ED Discharge Orders    None       Drenda Freeze, MD 10/03/18 [id number]

## 2018-10-03 NOTE — ED Notes (Signed)
Patient transported to CT 

## 2018-10-03 NOTE — ED Triage Notes (Signed)
Patient came in from home with complaints of chest discomfort for about 3 weeks. Patient has had weakness and some nausea as well. Tried to reach out to primary and they referred him to the ED. Patient had two heart stents placed 4 weeks ago.

## 2018-10-03 NOTE — H&P (Addendum)
[name]:[id number] DOB: August 10, 1945 DOA: 10/03/2018     PCP: Shirline Frees, MD   Outpatient Specialists:   CARDS:   Dr. Irish Lack   Pulmonary      Patient arrived to ER on 10/03/18 at 1407  Patient coming from: home Lives  With family    Chief Complaint:  Chief Complaint  Patient presents with   Chest Pain    HPI: [name] is a 73 y.o. male with medical history significant of CAD sp stents in begining of June 2020, CKD,HTN, DM2,  Hypothyroidism, history of adrenal insufficiency, anemia OSA, sarcoidosis  Presented with  Chest heaviness and overall fatigue He has been on Lasix 80 mg twice daily but still feels somewhat short of breath and has,he had some leg swelling. Patient states that initially after having his stents placed he felt much better prior to stenting he he could barely walk across the room without chest pain or shortness of breath.  For some time he was feeling improved and was able to ambulate well but lately for the past few weeks started to get progressively more short of breath with exertion he has dry cough.  Noticed that he has chronic lower extremity edema it is usually mild but for the past few weeks every time he stands up for too long he has significant lower extremity edema despite taking Lasix. Denies any constipation he has occasional loose stools which is chronic for him. Denies sick contacts no fevers or chills Describes numbness or chest pain but no chest heaviness is persistent. Yesterday he felt significant fatigue with any ambulation resulting in significant chest heaviness and even nausea he could hardly got up at all all day and spend majority of his time in bed he has been sleeping a lot more lately. Has underlying sarcoidosis for which he is taking prednisone.  He reports his sarcoid has been under better control but sometimes he has flareups. Patient states that throughout his medical history is been always difficult to determine what  causes his symptoms pulmonary versus cardiac in nature As he has been having good fluid intake he has been trying to drink plenty of fluids and has had good urination  Ports he has been taking his Plavix religiously never skipped   Infectious risk factors:  Reports none In  ER RAPID COVID TEST NEGATIVE    Regarding pertinent Chronic problems:     Hyperlipidemia -  on Crestor   HTN on    CHF diastolic  - last echo 10/15/9145  EF 60-65%.  Evidence of diastolic CHF Lasix 80mg  BID   CAD  - On Aspirin, statin,  Plavix                 -  followed by cardiology                - last cardiac cath 08/07/2018  Prox RCA to Mid RCA lesion is 25% stenosed.  Ost RPDA lesion is 80% stenosed. The PDA is a long vessel that feeds the apex.  A drug-eluting stent was successfully placed using a STENT RESOLUTE ONYX 2.25X8.  Post intervention, there is a 0% residual stenosis.  Ost RPDA to RPDA lesion is 70% stenosed.  A drug-eluting stent was successfully placed using a STENT RESOLUTE ONYX 2.0X30.  Post intervention, there is a 0% residual stenosis.  Previously placed Post Atrio stent (unknown type) is widely patent.  Prox LAD stent has a focal 40% restenosis.  LV  end diastolic pressure is normal.  There is no aortic valve stenosis.  Mid Cx lesion is 50% stenosed.  Ao sat: 99%, PA sat 75%, mean PA 18 mm Hg; mean PCWP 12 mm Hg; CO 6.9 L/min; CI 3.1    DM 2 -  Lab Results  Component Value Date   HGBA1C (H) 08/15/2008    12.2 (NOTE) The ADA recommends the following therapeutic goal for glycemic control related to Hgb A1c measurement: Goal of therapy: <6.5 Hgb A1c  Reference: American Diabetes Association: Clinical Practice Recommendations 2010, Diabetes Care, 2010, 33: (Suppl  1).   on  PO meds, on 70/30   Hypothyroidism:  Lab Results  Component Value Date   TSH 2.678 10/03/2018   on synthroid    obesity-   BMI Readings from Last 1 Encounters:  10/03/18 34.15 kg/m   OSA  -on CPAP,       CKD stage III - baseline Cr 1.5   While in ER: Cr up to 3.2 Trop are negative  The following Work up has been ordered so far:  Orders Placed This Encounter  Procedures   SARS Coronavirus 2 (CEPHEID - Performed in [facility] hospital lab), [employer]   DG Chest 2 View   US Renal   CT CHEST WO CONTRAST   Basic metabolic panel   CBC   Brain natriuretic peptide   Urinalysis, Routine w reflex microscopic   Hepatic function panel   Angiotensin converting enzyme   Cortisol   T3   T4, free   CBC with Differential/Platelet   Sodium, urine, random   Creatinine, urine, random   Magnesium   Phosphorus   TSH   Cardiac monitoring   Saline Lock IV, Maintain IV access   Orthostatic vital signs   Cardiac monitoring   Consult to hospitalist  ALL PATIENTS BEING ADMITTED/HAVING PROCEDURES NEED COVID-19 SCREENING   Pulse oximetry, continuous   EKG 12-Lead   ED EKG   Place in observation (patient's expected length of stay will be less than 2 midnights)     Following Medications were ordered in ER: Medications - No data to display      Consult Orders  (From admission, onward)         Start     Ordered   10/03/18 1750  Consult to hospitalist  ALL PATIENTS BEING ADMITTED/HAVING PROCEDURES NEED COVID-19 SCREENING Pg sent by dee  Once    Comments: ALL PATIENTS BEING ADMITTED/HAVING PROCEDURES NEED COVID-19 SCREENING  Provider:  (Not yet assigned)  Question Answer Comment  Place call to: [facility]   Reason for Consult Admit      10/03/18 1749           Significant initial  Findings: Abnormal Labs Reviewed  BASIC METABOLIC PANEL - Abnormal; Notable for the following components:      Result Value   Potassium 3.4 (*)    CO2 21 (*)    Glucose, Bld 241 (*)    BUN 41 (*)    Creatinine, Ser 3.24 (*)    Calcium 8.8 (*)    GFR calc non Af Amer 18 (*)    GFR calc Af Amer 21 (*)    Anion gap 18 (*)    All other components  within normal limits  CBC - Abnormal; Notable for the following components:   WBC 14.5 (*)    RBC 3.74 (*)    Hemoglobin 11.6 (*)    HCT 36.4 (*)    All  other components within normal limits  URINALYSIS, ROUTINE W REFLEX MICROSCOPIC - Abnormal; Notable for the following components:   Protein, ur 30 (*)    Bacteria, UA RARE (*)    All other components within normal limits  HEPATIC FUNCTION PANEL - Abnormal; Notable for the following components:   Albumin 3.2 (*)    AST 14 (*)    All other components within normal limits  CBC WITH DIFFERENTIAL/PLATELET - Abnormal; Notable for the following components:   WBC 13.6 (*)    RBC 3.57 (*)    Hemoglobin 11.2 (*)    HCT 34.7 (*)    Neutro Abs 10.4 (*)    Monocytes Absolute 1.2 (*)    Abs Immature Granulocytes 0.24 (*)    All other components within normal limits  PHOSPHORUS - Abnormal; Notable for the following components:   Phosphorus 4.8 (*)    All other components within normal limits     Otherwise labs showing:    Recent Labs  Lab 10/03/18 1430 10/03/18 1923  NA 138  --   K 3.4*  --   CO2 21*  --   GLUCOSE 241*  --   BUN 41*  --   CREATININE 3.24*  --   CALCIUM 8.8*  --   MG  --  1.8  PHOS  --  4.8*    Cr   Up from   Lab Results  Component Value Date   CREATININE 3.24 (H) 10/03/2018   CREATININE 1.41 (H) 08/07/2018   CREATININE 1.53 (H) 07/27/2018    Recent Labs  Lab 10/03/18 1923  AST 14*  ALT 16  ALKPHOS 52  BILITOT 0.9  PROT 7.0  ALBUMIN 3.2*   Lab Results  Component Value Date   CALCIUM 8.8 (L) 10/03/2018   PHOS 4.8 (H) 10/03/2018       WBC        Component Value Date/Time   WBC 13.6 (H) 10/03/2018 1923   ANC    Component Value Date/Time   NEUTROABS 10.4 (H) 10/03/2018 1923   ALC No results found for: LYMPHOABS    Plt: Lab Results  Component Value Date   PLT 278 10/03/2018     COVID-19 Labs    Lab Results  Component Value Date   SARSCOV2NAA NEGATIVE 10/03/2018   SARSCOV2NAA  NOT DETECTED 08/03/2018    HG/HCT   Down  from baseline see below    Component Value Date/Time   HGB 11.2 (L) 10/03/2018 1923   HGB 13.0 07/27/2018 1239   HCT 34.7 (L) 10/03/2018 1923   HCT 37.1 (L) 07/27/2018 1239     Troponin 15, 13   BNP (last 3 results) Recent Labs    10/03/18 1643  BNP 42.4    ProBNP (last 3 results) Recent Labs    07/27/18 1239  PROBNP 124    DM  labs:  HbA1C: No results for input(s): HGBA1C in the last 8760 hours.       UA   no evidence of UTI     Urine analysis:    Component Value Date/Time   COLORURINE YELLOW 10/03/2018 1720   APPEARANCEUR CLEAR 10/03/2018 1720   LABSPEC 1.015 10/03/2018 1720   PHURINE 5.0 10/03/2018 1720   GLUCOSEU NEGATIVE 10/03/2018 1720   HGBUR NEGATIVE 10/03/2018 1720   BILIRUBINUR NEGATIVE 10/03/2018 1720   KETONESUR NEGATIVE 10/03/2018 1720   PROTEINUR 30 (A) 10/03/2018 1720   UROBILINOGEN 0.2 11/09/2010 2146   NITRITE NEGATIVE 10/03/2018 1720   LEUKOCYTESUR  NEGATIVE 10/03/2018 1720     CXR -  NON acute    ECG:  Personally reviewed by me showing: HR : 101 Rhythm:  Sinus tachycardia   no evidence of ischemic changes QTC 453      ED Triage Vitals  Enc Vitals Group     BP 10/03/18 1416 (!) 111/55     Pulse Rate 10/03/18 1416 (!) 103     Resp 10/03/18 1416 (!) 22     Temp 10/03/18 1416 99.3 F (37.4 C)     Temp Source 10/03/18 1416 Oral     SpO2 10/03/18 1416 93 %     Weight 10/03/18 1426 238 lb (108 kg)     Height 10/03/18 1426 5\' 10"  (1.778 m)     Head Circumference --      Peak Flow --      Pain Score 10/03/18 1425 6     Pain Loc --      Pain Edu? --      Excl. in [place]? --   TMAX(24)@       Latest  Blood pressure (!) 122/53, pulse 65, temperature 99.3 F (37.4 C), temperature source Oral, resp. rate 14, height 5\' 10"  (1.778 m), weight 108 kg, SpO2 100 %.    Hospitalist was called for admission for AKI   Review of Systems:    Pertinent positives include:  fatigue, chest pain   Bilateral lower extremity swelling  shortness of breath at rest.  dyspnea on exertion, non-productive cough Constitutional:  No weight loss, night sweats, Fevers, chills,weight loss  HEENT:  No headaches, Difficulty swallowing,Tooth/dental problems,Sore throat,  No sneezing, itching, ear ache, nasal congestion, post nasal drip,  Cardio-vascular:  No , Orthopnea, PND, anasarca, dizziness, palpitations.noGI:  No heartburn, indigestion, abdominal pain, nausea, vomiting, diarrhea, change in bowel habits, loss of appetite, melena, blood in stool, hematemesis Resp:   No excess mucus, no productive cough,   No coughing up of blood. no change in color of mucus. No wheezing. Skin:  no rash or lesions. No jaundice GU:  no dysuria, change in color of urine, no urgency or frequency. No straining to urinate.  No flank pain.  Musculoskeletal:  No joint pain or no joint swelling. No decreased range of motion. No back pain.  Psych:  No change in mood or affect. No depression or anxiety. No memory loss.  Neuro: no localizing neurological complaints, no tingling, no weakness, no double vision, no gait abnormality, no slurred speech, no confusion  All systems reviewed and apart from [place] all are negative  Past Medical History:   Past Medical History:  Diagnosis Date   Adrenal insufficiency (HCC)    Allergy    Anemia    Arthritis    feet    CKD (chronic kidney disease), stage III (HCC)    Coronary artery disease    has stents   Coronary atherosclerosis of native coronary artery    Proximal LAD, posterior lateral stent widely patent-10/12/11   Diabetes mellitus    insulin and pills   Hearing loss    wears hearing aids   Heart attack ([place]) 2010   History of blood transfusion 06/30/2016   [facility] - 2 units transfused   Hypertension    OSA (obstructive sleep apnea)    uses VPAC sleep study 2 years done through [facility]. Dr. Marlou Porch arranged study   Pneumonia    3-4 years ago    Sarcoid    Sleep apnea  uses CPAP nightly   Thyroid disease    Type 2 diabetes mellitus ([place])        Past Surgical History:  Procedure Laterality Date   APPENDECTOMY     CATARACT EXTRACTION  2011   bilat   CHOLECYSTECTOMY  01/25/2011   Procedure: LAPAROSCOPIC CHOLECYSTECTOMY WITH INTRAOPERATIVE CHOLANGIOGRAM;  Surgeon: Judieth Keens, DO;  Location: [facility];  Service: General;  Laterality: N/A;   COLONOSCOPY     several   CORONARY ANGIOPLASTY     most recent 11/2009   CORONARY STENT INTERVENTION N/A 08/07/2018   Procedure: CORONARY STENT INTERVENTION;  Surgeon: Jettie Booze, MD;  Location: [facility];  Service: Cardiovascular;  Laterality: N/A;   CORONARY STENT PLACEMENT  2009   in LAD and side branch PTCA   INTERCOSTAL NERVE BLOCK  2011, 06/2016   x2. lumbar spine   LEFT HEART CATHETERIZATION WITH CORONARY ANGIOGRAM Bilateral 10/12/2011   Procedure: LEFT HEART CATHETERIZATION WITH CORONARY ANGIOGRAM;  Surgeon: Candee Furbish, MD;  Location: [facility] C[facility] LAB;  Service: Cardiovascular;  Laterality: Bilateral;   LEFT HEART CATHETERIZATION WITH CORONARY ANGIOGRAM N/A 04/01/2014   Procedure: LEFT HEART CATHETERIZATION WITH CORONARY ANGIOGRAM;  Surgeon: Candee Furbish, MD;  Location: [facility] C[facility] LAB;  Service: Cardiovascular;  Laterality: N/A;   RIGHT/LEFT HEART CATH AND CORONARY ANGIOGRAPHY N/A 08/07/2018   Procedure: RIGHT/LEFT HEART CATH AND CORONARY ANGIOGRAPHY;  Surgeon: Jettie Booze, MD;  Location: [facility];  Service: Cardiovascular;  Laterality: N/A;    Social History:  Ambulatory   independently      reports that he has never smoked. He has never used smokeless tobacco. He reports that he does not drink alcohol or use drugs.     Family History:   Family History  Problem Relation Age of Onset   Kidney disease Mother    Diabetes Mother    Kidney cancer Mother    Heart attack Father    Asthma Sister    Anesthesia problems Sister         "Kidney's did not wake up"   Colon cancer Neg Hx    Colon polyps Neg Hx    Rectal cancer Neg Hx    Stomach cancer Neg Hx     Allergies: Allergies  Allergen Reactions   Hydrocortisone Nausea Only   Metolazone     Drained, no energy   Statins Other (See Comments)    Pt says they make him "mean"     Prior to Admission medications   Medication Sig Start Date End Date Taking? Authorizing Provider  acetaminophen (TYLENOL) 500 MG tablet Take 500-1,000 mg by mouth daily as needed for moderate pain or headache.     [provider]  albuterol (PROVENTIL) (2.5 MG/3ML) 0.083% nebulizer solution Take 3 mLs (2.5 mg total) by nebulization every 6 (six) hours as needed for wheezing or shortness of breath. Dx Code D86.0 07/24/14   Elsie Stain, MD  Albuterol Sulfate (PROAIR RESPICLICK) 809 (90 BASE) MCG/ACT AEPB Inhale 2 puffs into the lungs every 6 (six) hours as needed. 07/24/14   Elsie Stain, MD  aspirin EC 81 MG tablet Take 81 mg by mouth every evening.     [provider]  Cholecalciferol (VITAMIN D3) 3000 UNITS TABS Take 3,000 Units by mouth daily.    [provider]  clopidogrel (PLAVIX) 75 MG tablet Take 1 tablet (75 mg total) by mouth daily. 04/17/14   Jerline Pain, MD  Coenzyme Q10 (COQ-10)  400 MG CAPS Take 400 mg by mouth every evening.    [provider]  fish oil-omega-3 fatty acids 1000 MG capsule Take 1 g by mouth daily.     [provider]  furosemide (LASIX) 80 MG tablet Take 80 mg by mouth See admin instructions. Take 80 mg twice daily, may take a third 80 mg dose as needed for swelling    [provider]  glipiZIDE (GLUCOTROL) 10 MG tablet Take 10 mg by mouth every evening.     [provider]  insulin aspart protamine-insulin aspart (NOVOLOG 70/30) (70-30) 100 UNIT/ML injection Inject 40-45 Units into the skin 3 (three) times daily as needed (CBG >100).     [provider]  levothyroxine  (SYNTHROID, LEVOTHROID) 50 MCG tablet Take 50 mcg by mouth daily.  06/21/16   [provider]  losartan (COZAAR) 100 MG tablet Take 100 mg by mouth daily.    [provider]  montelukast (SINGULAIR) 10 MG tablet Take 10 mg by mouth daily as needed (allergies).  07/18/18   [provider]  pantoprazole (PROTONIX) 40 MG tablet Take 40 mg by mouth daily. 03/21/18   [provider]  potassium chloride (KLOR-CON M10) 10 MEQ tablet Take 2 tablets (20 mEq total) by mouth 2 (two) times daily. 03/26/14   Elsie Stain, MD  predniSONE (DELTASONE) 10 MG tablet Take 1 tablet (10 mg total) by mouth daily with breakfast. T 05/15/15   Parrett, Tammy S, NP  RABEprazole (ACIPHEX) 20 MG tablet Take 1 tablet (20 mg total) by mouth 2 (two) times daily as needed. 12/07/15   Rigoberto Noel, MD  rosuvastatin (CRESTOR) 10 MG tablet Take 1 tablet (10 mg total) by mouth at bedtime. 08/24/18   Jerline Pain, MD  sodium chloride (OCEAN) 0.65 % SOLN nasal spray Place 1 spray into both nostrils as needed for congestion.    [provider]  Vitamin D, Ergocalciferol, (DRISDOL) 50000 UNITS CAPS capsule Take 50,000 Units by mouth every Sunday.  12/21/13   [provider]   Physical Exam: Blood pressure (!) 122/53, pulse 65, temperature 99.3 F (37.4 C), temperature source Oral, resp. rate 14, height 5\' 10"  (1.778 m), weight 108 kg, SpO2 100 %. 1. General:  in No Acute distress    Chronically ill  -appearing 2. Psychological: Alert and  Oriented 3. Head/ENT:    Dry Mucous Membranes                          Head Non traumatic, neck supple                            Poor Dentition 4. SKIN:   decreased Skin turgor,  Skin clean Dry and intact no rash 5. Heart: Regular rate and rhythm no Murmur, no Rub or gallop 6. Lungs , no wheezes or crackles   7. Abdomen: Soft,  non-tender,   Distended  obese  bowel sounds present 8. Lower extremities: no clubbing, cyanosis, no  edema 9.  Neurologically Grossly intact, moving all 4 extremities equally  10. MSK: Normal range of motion   All other LABS:     Recent Labs  Lab 10/03/18 1430 10/03/18 1923  WBC 14.5* 13.6*  NEUTROABS  --  10.4*  HGB 11.6* 11.2*  HCT 36.4* 34.7*  MCV 97.3 97.2  PLT 339 278     Recent Labs  Lab  10/03/18 1430 10/03/18 1923  NA 138  --   K 3.4*  --   CL 99  --   CO2 21*  --   GLUCOSE 241*  --   BUN 41*  --   CREATININE 3.24*  --   CALCIUM 8.8*  --   MG  --  1.8  PHOS  --  4.8*     Recent Labs  Lab 10/03/18 1923  AST 14*  ALT 16  ALKPHOS 52  BILITOT 0.9  PROT 7.0  ALBUMIN 3.2*       Cultures:    Component Value Date/Time   SDES URINE, CLEAN CATCH 11/09/2010 2146   SPECREQUEST IMMUNE:NORM UT SYMPT:NEG 11/09/2010 2146   CULT NO GROWTH 11/09/2010 2146   REPTSTATUS 11/10/2010 FINAL 11/09/2010 2146     Radiological Exams on Admission: Dg Chest 2 View  Result Date: 10/03/2018 CLINICAL DATA:  Chest pain for 4 weeks, coronary stent put in 4 weeks ago, diabetes mellitus, coronary artery disease post MI, hypertension, sarcoid EXAM: CHEST - 2 VIEW COMPARISON:  06/15/2016, 06/25/2015 Correlation: CT chest 02/20/2014 FINDINGS: Enlargement of cardiac silhouette. Blunting of the RIGHT costophrenic angle by large fat pad on prior CT. Mediastinal contours and pulmonary vascularity otherwise normal. Chronic interstitial prominence in the lower lungs bilaterally, greater on RIGHT, question related to chronic interstitial lung disease from sarcoidosis, unchanged since 06/25/2015. No definite acute infiltrate, pleural effusion or pneumothorax. Osseous structures unremarkable. IMPRESSION: Chronic basilar interstitial disease greater on RIGHT. Mild enlargement of cardiac silhouette with prominent RIGHT epicardial fat pad. No acute abnormalities. Electronically Signed   By: Lavonia Dana M.D.   On: 10/03/2018 15:14    Chart has been reviewed    Assessment/Plan   73 y.o. male with medical  history significant of CAD sp stents in End of June 2020, CKD,HTN, DM2,  Hypothyroidism, history of adrenal insufficiency, anemia OSA, sarcoidosis  Admitted for AKI , possible recurrent angina  Present on Admission:  Coronary atherosclerosis of native coronary artery -tinea aspirin and Plavix email cardiology is not sure if recurrent shortness of breath and dyspnea on exertion is cardiac in nature patient with extensive known coronary artery disease So far troponin given normal limits continue to monitor on telemetry obtain echogram to evaluate for any wall motion abnormality   Sarcoidosis -unclear if this is currently contributing to the dyspnea.  Obtain ACE level, obtain CT of the chest noncontrasted given AKI to evaluate for any progression if worrisome may benefit from pulmonology consult, Continue steroids   GERD -   stable continue home medications    Sleep apnea continue CPAP   Chronic diastolic heart failure (HCC) -clinically appears to be somewhat on the dry side although reports occasional lower extremity edema worse with standing.  Will hold off on Lasix for tonight may need to gently restart when able to tolerate appreciate cardiology input Lower extremity edema could be multifactorial including low albumin and chronic use of prednisone currently no evidence of pulmonary edema to explain dyspnea   Hyperlipidemia -  -   stable continue home medications   CKD (chronic kidney disease), stage III (HCC) chronic avoid nephrotoxic medications currently AKI  Iron deficiency anemia chronic but somewhat worse from baseline check anemia panel, Anemia could be also contributing factor to dyspnea  AKI (acute kidney injury) ([place]) -hold Lasix for tonight gently rehydrate and follow obtain urine electrolytes obtain renal US Hold ARB  Hypoalbuminemia -check prealbumin nutritional consult ordered  Hypokalemia-  will replace, check Mag and  Phosphate level  DM 2-  - Order Sensitive   SSI    -  check TSH and HgA1C  - Hold by mouth medications  Need to clarify insulin usage, change to low dose Lantus for now  Hypothyroidism -continue home medications check TSH Other plan as per orders.  DVT prophylaxis:   Lovenox     Code Status:  FULL CODE  as per patient   I had personally discussed CODE STATUS with patient    Family Communication:   Family   at  Bedside  plan of care was discussed   with   Wife,  Disposition Plan:    To home once workup is complete and patient is stable                      Would benefit from PT/OT eval prior to DC  Ordered                                     Consults called: emailed cardiology     Admission status:  ED Disposition    ED Disposition Condition [place]: [place] [[id number]]  Level of Care: Telemetry Medical [104]  I expect the patient will be discharged within 24 hours: Yes  LOW acuity---Tx typically complete <24 hrs---ACUTE conditions typically can be evaluated <24 hours---LABS likely to return to acceptable levels <24 hours---IS near functional baseline---EXPECTED to return to current living arrangement---NOT newly hypoxic: Meets criteria for 5C-Observation unit  Covid Evaluation: Confirmed COVID Negative  Diagnosis: AKI (acute kidney injury) ([place]) [[id number]]  Admitting Physician: Toy Baker [[id number]]  Attending Physician: Toy Baker [[id number]]  PT Class (Do Not Modify): Observation [104]  PT Acc Code (Do Not Modify): Observation [10022]         Obs    Level of care     tele  For  24H     Precautions:  NONE  No active isolations  PPE: Used by the provider:   P100  eye Goggles,  Gloves        Latrelle Bazar 10/03/2018, 9:03 PM    [facility]     after 2 AM please page floor coverage PA If 7AM-7PM, please contact the day team taking care of the patient using [email or web address]

## 2018-10-03 NOTE — ED Notes (Addendum)
ED TO INPATIENT HANDOFF REPORT  ED Nurse Name and Phone #: Arnaldo Natal Name/Age/Gender [name] 72 y.o. male Room/Bed: 025C/025C  Code Status   Code Status: Prior  Home/SNF/Other Home Patient oriented to: situation Is this baseline? No   Triage Complete: Triage complete  Chief Complaint CP  Triage Note Patient came in from home with complaints of chest discomfort for about 3 weeks. Patient has had weakness and some nausea as well. Tried to reach out to primary and they referred him to the ED. Patient had two heart stents placed 4 weeks ago.   Allergies Allergies  Allergen Reactions  . Hydrocortisone Nausea Only  . Metolazone     Drained, no energy  . Statins Other (See Comments)    Pt says they make him "mean"    Level of Care/Admitting Diagnosis ED Disposition    None      B Medical/Surgery History Past Medical History:  Diagnosis Date  . Adrenal insufficiency ([place])   . Allergy   . Anemia   . Arthritis    feet   . CKD (chronic kidney disease), stage III ([place])   . Coronary artery disease    has stents  . Coronary atherosclerosis of native coronary artery    Proximal LAD, posterior lateral stent widely patent-10/12/11  . Diabetes mellitus    insulin and pills  . Hearing loss    wears hearing aids  . Heart attack ([place]) 2010  . History of blood transfusion 06/30/2016   [facility] - 2 units transfused  . Hypertension   . OSA (obstructive sleep apnea)    uses VPAC sleep study 2 years done through [facility]. Dr. Marlou Porch arranged study  . Pneumonia    3-4 years ago  . Sarcoid   . Sleep apnea    uses CPAP nightly  . Thyroid disease   . Type 2 diabetes mellitus ([place])    Past Surgical History:  Procedure Laterality Date  . APPENDECTOMY    . CATARACT EXTRACTION  2011   bilat  . CHOLECYSTECTOMY  01/25/2011   Procedure: LAPAROSCOPIC CHOLECYSTECTOMY WITH INTRAOPERATIVE CHOLANGIOGRAM;  Surgeon: Judieth Keens, DO;  Location: [facility];  Service:  General;  Laterality: N/A;  . COLONOSCOPY     several  . CORONARY ANGIOPLASTY     most recent 11/2009  . CORONARY STENT INTERVENTION N/A 08/07/2018   Procedure: CORONARY STENT INTERVENTION;  Surgeon: Jettie Booze, MD;  Location: [facility];  Service: Cardiovascular;  Laterality: N/A;  . CORONARY STENT PLACEMENT  2009   in LAD and side branch PTCA  . INTERCOSTAL NERVE BLOCK  2011, 06/2016   x2. lumbar spine  . LEFT HEART CATHETERIZATION WITH CORONARY ANGIOGRAM Bilateral 10/12/2011   Procedure: LEFT HEART CATHETERIZATION WITH CORONARY ANGIOGRAM;  Surgeon: Candee Furbish, MD;  Location: [facility] CATH LAB;  Service: Cardiovascular;  Laterality: Bilateral;  . LEFT HEART CATHETERIZATION WITH CORONARY ANGIOGRAM N/A 04/01/2014   Procedure: LEFT HEART CATHETERIZATION WITH CORONARY ANGIOGRAM;  Surgeon: Candee Furbish, MD;  Location: [facility] CATH LAB;  Service: Cardiovascular;  Laterality: N/A;  . RIGHT/LEFT HEART CATH AND CORONARY ANGIOGRAPHY N/A 08/07/2018   Procedure: RIGHT/LEFT HEART CATH AND CORONARY ANGIOGRAPHY;  Surgeon: Jettie Booze, MD;  Location: [facility] CV LAB;  Service: Cardiovascular;  Laterality: N/A;     A IV Location/Drains/Wounds Patient Lines/Drains/Airways Status   Active Line/Drains/Airways    Name:   Placement date:   Placement time:   Site:  Days:   Peripheral IV 08/07/18 Right Antecubital   08/07/18    0750    Antecubital   57   Closed/Suction Drain  Right;Lateral Abdomen Bulb (JP) 19 Fr.   01/25/11    1016    Abdomen   2808   Incision 01/25/11 Abdomen Other (Comment)   01/25/11    1000     2808   Incision - 5 Ports Abdomen Mid Umbilicus Right;Lateral Left;Lateral Right;Medial   01/25/11    0808     2808          Intake/Output Last 24 hours  Intake/Output Summary (Last 24 hours) at 10/03/2018 1853 Last data filed at 10/03/2018 1640 Gross per 24 hour  Intake 0 ml  Output 0 ml  Net 0 ml    Labs/Imaging Results for orders placed or performed during the hospital  encounter of 10/03/18 (from the past 48 hour(s))  Basic metabolic panel     Status: Abnormal   Collection Time: 10/03/18  2:30 PM  Result Value Ref Range   Sodium 138 135 - 145 mmol/L   Potassium 3.4 (L) 3.5 - 5.1 mmol/L   Chloride 99 98 - 111 mmol/L   CO2 21 (L) 22 - 32 mmol/L   Glucose, Bld 241 (H) 70 - 99 mg/dL   BUN 41 (H) 8 - 23 mg/dL   Creatinine, Ser 3.24 (H) 0.61 - 1.24 mg/dL   Calcium 8.8 (L) 8.9 - 10.3 mg/dL   GFR calc non Af Amer 18 (L) >60 mL/min   GFR calc Af Amer 21 (L) >60 mL/min   Anion gap 18 (H) 5 - 15    Comment: Performed at [facility], [address], [place], [place] [address]  CBC     Status: Abnormal   Collection Time: 10/03/18  2:30 PM  Result Value Ref Range   WBC 14.5 (H) 4.0 - 10.5 K/uL   RBC 3.74 (L) 4.22 - 5.81 MIL/uL   Hemoglobin 11.6 (L) 13.0 - 17.0 g/dL   HCT 36.4 (L) 39.0 - 52.0 %   MCV 97.3 80.0 - 100.0 fL   MCH 31.0 26.0 - 34.0 pg   MCHC 31.9 30.0 - 36.0 g/dL   RDW 13.2 11.5 - 15.5 %   Platelets 339 150 - 400 K/uL   nRBC 0.0 0.0 - 0.2 %    Comment: Performed at [facility], [place] [address] [place], [place] [address]  Troponin I (High Sensitivity)     Status: None   Collection Time: 10/03/18  2:30 PM  Result Value Ref Range   Troponin I (High Sensitivity) 13 <18 ng/L    Comment: (NOTE) Elevated high sensitivity troponin I (hsTnI) values and significant  changes across serial measurements may suggest ACS but many other  chronic and acute conditions are known to elevate hsTnI results.  Refer to the "Links" section for chest pain algorithms and additional  guidance. Performed at [facility], [place] [address], [place], [place] [address]   Troponin I (High Sensitivity)     Status: None   Collection Time: 10/03/18  4:28 PM  Result Value Ref Range   Troponin I (High Sensitivity) 15 <18 ng/L    Comment: (NOTE) Elevated high sensitivity troponin I (hsTnI) values and significant  changes across serial measurements may  suggest ACS but many other  chronic and acute conditions are known to elevate hsTnI results.  Refer to the "Links" section for chest pain algorithms and additional  guidance. Performed at [facility], [place] [address] [place], [place] [address]   Brain natriuretic peptide     Status: None   Collection Time: 10/03/18  4:43 PM  Result Value Ref Range   B Natriuretic Peptide 42.4 0.0 - 100.0 pg/mL    Comment: Performed at [facility] [address] [place], [place] [address]  SARS Coronavirus 2 (CEPHEID - Performed in [facility] hospital lab), Hosp Order     Status: None   Collection Time: 10/03/18  4:50 PM   Specimen: Urine, Clean Catch; Nasopharyngeal  Result Value Ref Range   SARS Coronavirus 2 NEGATIVE NEGATIVE    Comment: (NOTE) If result is NEGATIVE SARS-CoV-2 target nucleic acids are NOT DETECTED. The SARS-CoV-2 RNA is generally detectable in upper and lower  respiratory specimens during the acute phase of infection. The lowest  concentration of SARS-CoV-2 viral copies this assay can detect is 250  copies / mL. A negative result does not preclude SARS-CoV-2 infection  and should not be used as the sole basis for treatment or other  patient management decisions.  A negative result may occur with  improper specimen collection / handling, submission of specimen other  than nasopharyngeal swab, presence of viral mutation(s) within the  areas targeted by this assay, and inadequate number of viral copies  (<250 copies / mL). A negative result must be combined with clinical  observations, patient history, and epidemiological information. If result is POSITIVE SARS-CoV-2 target nucleic acids are DETECTED. The SARS-CoV-2 RNA is generally detectable in upper and lower  respiratory specimens dur ing the acute phase of infection.  Positive  results are indicative of active infection with SARS-CoV-2.  Clinical  correlation with patient history and other diagnostic  information is  necessary to determine patient infection status.  Positive results do  not rule out bacterial infection or co-infection with other viruses. If result is PRESUMPTIVE POSTIVE SARS-CoV-2 nucleic acids MAY BE PRESENT.   A presumptive positive result was obtained on the submitted specimen  and confirmed on repeat testing.  While 2019 novel coronavirus  (SARS-CoV-2) nucleic acids may be present in the submitted sample  additional confirmatory testing may be necessary for epidemiological  and / or clinical management purposes  to differentiate between  SARS-CoV-2 and other Sarbecovirus currently known to infect humans.  If clinically indicated additional testing with an alternate test  methodology [phone]) is advised. The SARS-CoV-2 RNA is generally  detectable in upper and lower respiratory sp ecimens during the acute  phase of infection. The expected result is Negative. Fact Sheet for Patients:  StrictlyIdeas.no Fact Sheet for Healthcare Providers: BankingDealers.co.za This test is not yet approved or cleared by the [place] FDA and has been authorized for detection and/or diagnosis of SARS-CoV-2 by FDA under an Emergency Use Authorization (EUA).  This EUA will remain in effect (meaning this test can be used) for the duration of the COVID-19 declaration under Section 564(b)(1) of the Act, 21 U.S.C. section 360bbb-3(b)(1), unless the authorization is terminated or revoked sooner. Performed at [facility], [place] [address] [place], [place] [address]   Urinalysis, Routine w reflex microscopic     Status: Abnormal   Collection Time: 10/03/18  5:20 PM  Result Value Ref Range   Color, Urine YELLOW YELLOW   APPearance CLEAR CLEAR   Specific Gravity, Urine 1.015 1.005 - 1.030   pH 5.0 5.0 - 8.0   Glucose, UA NEGATIVE NEGATIVE mg/dL   Hgb urine  dipstick NEGATIVE NEGATIVE   Bilirubin Urine NEGATIVE NEGATIVE   Ketones,  ur NEGATIVE NEGATIVE mg/dL   Protein, ur 30 (A) NEGATIVE mg/dL   Nitrite NEGATIVE NEGATIVE   Leukocytes,Ua NEGATIVE NEGATIVE   WBC, UA 0-5 0 - 5 WBC/hpf   Bacteria, UA RARE (A) NONE SEEN   Squamous Epithelial / LPF 0-5 0 - 5   Mucus PRESENT    Hyaline Casts, UA PRESENT     Comment: Performed at [facility] [address], [place], [place] [address]   Dg Chest 2 View  Result Date: 10/03/2018 CLINICAL DATA:  Chest pain for 4 weeks, coronary stent put in 4 weeks ago, diabetes mellitus, coronary artery disease post MI, hypertension, sarcoid EXAM: CHEST - 2 VIEW COMPARISON:  06/15/2016, 06/25/2015 Correlation: CT chest 02/20/2014 FINDINGS: Enlargement of cardiac silhouette. Blunting of the RIGHT costophrenic angle by large fat pad on prior CT. Mediastinal contours and pulmonary vascularity otherwise normal. Chronic interstitial prominence in the lower lungs bilaterally, greater on RIGHT, question related to chronic interstitial lung disease from sarcoidosis, unchanged since 06/25/2015. No definite acute infiltrate, pleural effusion or pneumothorax. Osseous structures unremarkable. IMPRESSION: Chronic basilar interstitial disease greater on RIGHT. Mild enlargement of cardiac silhouette with prominent RIGHT epicardial fat pad. No acute abnormalities. Electronically Signed   By: Lavonia Dana M.D.   On: 10/03/2018 15:14    Pending Labs Unresulted Labs (From admission, onward)   None      Vitals/Pain Today's Vitals   10/03/18 1416 10/03/18 1425 10/03/18 1426 10/03/18 1638  BP: (!) 111/55   (!) 111/42  Pulse: (!) 103   82  Resp: (!) 22   18  Temp: 99.3 F (37.4 C)     TempSrc: Oral     SpO2: 93% 93%  97%  Weight:   108 kg   Height:   5\' 10"  (1.778 m)   PainSc:  6   6     Isolation Precautions No active isolations  Medications Medications - No data to display  Mobility walks Low fall risk   Focused Assessments Cardiac Assessment Handoff:  Cardiac Rhythm: Normal sinus  rhythm Lab Results  Component Value Date   CKTOTAL 82 11/09/2010   CKMB 2.2 11/09/2010   TROPONINI <0.30 11/09/2010   Lab Results  Component Value Date   DDIMER 1.08 (H) 11/09/2010   Does the Patient currently have chest pain? No     R Recommendations: See Admitting Provider Note  Report given to:   Additional Notes: Recent heart stent; c/o CP; a&ox4

## 2018-10-03 NOTE — Telephone Encounter (Signed)
New Message:    Pt said he had stents put in the end of June. He says he have episodes where his chest is real sore and tight. He says when it hurts it make him nauseated.

## 2018-10-04 ENCOUNTER — Encounter (HOSPITAL_COMMUNITY): Payer: Self-pay

## 2018-10-04 DIAGNOSIS — I5032 Chronic diastolic (congestive) heart failure: Secondary | ICD-10-CM | POA: Diagnosis present

## 2018-10-04 DIAGNOSIS — R0600 Dyspnea, unspecified: Secondary | ICD-10-CM | POA: Diagnosis present

## 2018-10-04 DIAGNOSIS — E785 Hyperlipidemia, unspecified: Secondary | ICD-10-CM

## 2018-10-04 DIAGNOSIS — I130 Hypertensive heart and chronic kidney disease with heart failure and stage 1 through stage 4 chronic kidney disease, or unspecified chronic kidney disease: Secondary | ICD-10-CM | POA: Diagnosis present

## 2018-10-04 DIAGNOSIS — N179 Acute kidney failure, unspecified: Secondary | ICD-10-CM | POA: Diagnosis present

## 2018-10-04 DIAGNOSIS — D860 Sarcoidosis of lung: Secondary | ICD-10-CM | POA: Diagnosis present

## 2018-10-04 DIAGNOSIS — E2740 Unspecified adrenocortical insufficiency: Secondary | ICD-10-CM | POA: Diagnosis present

## 2018-10-04 DIAGNOSIS — M7989 Other specified soft tissue disorders: Secondary | ICD-10-CM | POA: Diagnosis not present

## 2018-10-04 DIAGNOSIS — E039 Hypothyroidism, unspecified: Secondary | ICD-10-CM | POA: Diagnosis present

## 2018-10-04 DIAGNOSIS — E876 Hypokalemia: Secondary | ICD-10-CM

## 2018-10-04 DIAGNOSIS — I252 Old myocardial infarction: Secondary | ICD-10-CM | POA: Diagnosis not present

## 2018-10-04 DIAGNOSIS — D509 Iron deficiency anemia, unspecified: Secondary | ICD-10-CM | POA: Diagnosis present

## 2018-10-04 DIAGNOSIS — E1122 Type 2 diabetes mellitus with diabetic chronic kidney disease: Secondary | ICD-10-CM | POA: Diagnosis present

## 2018-10-04 DIAGNOSIS — T380X5A Adverse effect of glucocorticoids and synthetic analogues, initial encounter: Secondary | ICD-10-CM | POA: Diagnosis present

## 2018-10-04 DIAGNOSIS — G4733 Obstructive sleep apnea (adult) (pediatric): Secondary | ICD-10-CM | POA: Diagnosis present

## 2018-10-04 DIAGNOSIS — E860 Dehydration: Secondary | ICD-10-CM | POA: Diagnosis present

## 2018-10-04 DIAGNOSIS — D631 Anemia in chronic kidney disease: Secondary | ICD-10-CM | POA: Diagnosis present

## 2018-10-04 DIAGNOSIS — Z20828 Contact with and (suspected) exposure to other viral communicable diseases: Secondary | ICD-10-CM | POA: Diagnosis present

## 2018-10-04 DIAGNOSIS — N189 Chronic kidney disease, unspecified: Secondary | ICD-10-CM

## 2018-10-04 DIAGNOSIS — N183 Chronic kidney disease, stage 3 (moderate): Secondary | ICD-10-CM | POA: Diagnosis present

## 2018-10-04 DIAGNOSIS — E1165 Type 2 diabetes mellitus with hyperglycemia: Secondary | ICD-10-CM

## 2018-10-04 DIAGNOSIS — I2510 Atherosclerotic heart disease of native coronary artery without angina pectoris: Secondary | ICD-10-CM | POA: Diagnosis present

## 2018-10-04 DIAGNOSIS — K219 Gastro-esophageal reflux disease without esophagitis: Secondary | ICD-10-CM | POA: Diagnosis present

## 2018-10-04 DIAGNOSIS — M549 Dorsalgia, unspecified: Secondary | ICD-10-CM | POA: Diagnosis present

## 2018-10-04 DIAGNOSIS — M79601 Pain in right arm: Secondary | ICD-10-CM | POA: Diagnosis not present

## 2018-10-04 DIAGNOSIS — J329 Chronic sinusitis, unspecified: Secondary | ICD-10-CM | POA: Diagnosis present

## 2018-10-04 DIAGNOSIS — I444 Left anterior fascicular block: Secondary | ICD-10-CM | POA: Diagnosis present

## 2018-10-04 DIAGNOSIS — R52 Pain, unspecified: Secondary | ICD-10-CM | POA: Diagnosis not present

## 2018-10-04 LAB — CBC
HCT: 30.4 % — ABNORMAL LOW (ref 39.0–52.0)
Hemoglobin: 9.9 g/dL — ABNORMAL LOW (ref 13.0–17.0)
MCH: 31.3 pg (ref 26.0–34.0)
MCHC: 32.6 g/dL (ref 30.0–36.0)
MCV: 96.2 fL (ref 80.0–100.0)
Platelets: 259 10*3/uL (ref 150–400)
RBC: 3.16 MIL/uL — ABNORMAL LOW (ref 4.22–5.81)
RDW: 13.1 % (ref 11.5–15.5)
WBC: 12.9 10*3/uL — ABNORMAL HIGH (ref 4.0–10.5)
nRBC: 0 % (ref 0.0–0.2)

## 2018-10-04 LAB — PHOSPHORUS: Phosphorus: 3.8 mg/dL (ref 2.5–4.6)

## 2018-10-04 LAB — HEMOGLOBIN A1C
Hgb A1c MFr Bld: 10.2 % — ABNORMAL HIGH (ref 4.8–5.6)
Mean Plasma Glucose: 246.04 mg/dL

## 2018-10-04 LAB — IRON AND TIBC
Iron: 22 ug/dL — ABNORMAL LOW (ref 45–182)
Saturation Ratios: 8 % — ABNORMAL LOW (ref 17.9–39.5)
TIBC: 270 ug/dL (ref 250–450)
UIBC: 248 ug/dL

## 2018-10-04 LAB — GLUCOSE, CAPILLARY
Glucose-Capillary: 150 mg/dL — ABNORMAL HIGH (ref 70–99)
Glucose-Capillary: 203 mg/dL — ABNORMAL HIGH (ref 70–99)
Glucose-Capillary: 213 mg/dL — ABNORMAL HIGH (ref 70–99)
Glucose-Capillary: 231 mg/dL — ABNORMAL HIGH (ref 70–99)
Glucose-Capillary: 248 mg/dL — ABNORMAL HIGH (ref 70–99)
Glucose-Capillary: 348 mg/dL — ABNORMAL HIGH (ref 70–99)

## 2018-10-04 LAB — COMPREHENSIVE METABOLIC PANEL
ALT: 14 U/L (ref 0–44)
AST: 13 U/L — ABNORMAL LOW (ref 15–41)
Albumin: 2.9 g/dL — ABNORMAL LOW (ref 3.5–5.0)
Alkaline Phosphatase: 46 U/L (ref 38–126)
Anion gap: 10 (ref 5–15)
BUN: 40 mg/dL — ABNORMAL HIGH (ref 8–23)
CO2: 26 mmol/L (ref 22–32)
Calcium: 8.5 mg/dL — ABNORMAL LOW (ref 8.9–10.3)
Chloride: 101 mmol/L (ref 98–111)
Creatinine, Ser: 3.05 mg/dL — ABNORMAL HIGH (ref 0.61–1.24)
GFR calc Af Amer: 23 mL/min — ABNORMAL LOW (ref >60)
GFR calc non Af Amer: 19 mL/min — ABNORMAL LOW (ref >60)
Glucose, Bld: 144 mg/dL — ABNORMAL HIGH (ref 70–99)
Potassium: 3 mmol/L — ABNORMAL LOW (ref 3.5–5.1)
Sodium: 137 mmol/L (ref 135–145)
Total Bilirubin: 0.6 mg/dL (ref 0.3–1.2)
Total Protein: 6.1 g/dL — ABNORMAL LOW (ref 6.5–8.1)

## 2018-10-04 LAB — RETICULOCYTES
Immature Retic Fract: 13.4 % (ref 2.3–15.9)
RBC.: 3.16 MIL/uL — ABNORMAL LOW (ref 4.22–5.81)
Retic Count, Absolute: 43.3 10*3/uL (ref 19.0–186.0)
Retic Ct Pct: 1.4 % (ref 0.4–3.1)

## 2018-10-04 LAB — FERRITIN: Ferritin: 80 ng/mL (ref 24–336)

## 2018-10-04 LAB — PREALBUMIN: Prealbumin: 21.9 mg/dL (ref 18–38)

## 2018-10-04 LAB — MAGNESIUM: Magnesium: 1.6 mg/dL — ABNORMAL LOW (ref 1.7–2.4)

## 2018-10-04 LAB — VITAMIN B12: Vitamin B-12: 1392 pg/mL — ABNORMAL HIGH (ref 180–914)

## 2018-10-04 LAB — FOLATE: Folate: 10.3 ng/mL (ref >5.9)

## 2018-10-04 MED ORDER — POTASSIUM CHLORIDE CRYS ER 20 MEQ PO TBCR
40.0000 meq | EXTENDED_RELEASE_TABLET | Freq: Once | ORAL | Status: AC
  Administered 2018-10-04: 14:00:00 40 meq via ORAL
  Filled 2018-10-04: qty 2

## 2018-10-04 MED ORDER — SODIUM CHLORIDE 0.9 % IV SOLN
INTRAVENOUS | Status: AC
  Administered 2018-10-04: 16:00:00 via INTRAVENOUS

## 2018-10-04 MED ORDER — MAGNESIUM SULFATE IN D5W 1-5 GM/100ML-% IV SOLN
1.0000 g | Freq: Once | INTRAVENOUS | Status: AC
  Administered 2018-10-04: 16:00:00 1 g via INTRAVENOUS
  Filled 2018-10-04: qty 100

## 2018-10-04 MED ORDER — INSULIN ASPART 100 UNIT/ML ~~LOC~~ SOLN
0.0000 [IU] | Freq: Three times a day (TID) | SUBCUTANEOUS | Status: DC
  Administered 2018-10-04: 7 [IU] via SUBCUTANEOUS
  Administered 2018-10-05: 3 [IU] via SUBCUTANEOUS
  Administered 2018-10-05: 2 [IU] via SUBCUTANEOUS
  Administered 2018-10-05: 7 [IU] via SUBCUTANEOUS
  Administered 2018-10-06 (×2): 3 [IU] via SUBCUTANEOUS
  Administered 2018-10-06 – 2018-10-07 (×2): 2 [IU] via SUBCUTANEOUS
  Administered 2018-10-07: 5 [IU] via SUBCUTANEOUS

## 2018-10-04 MED ORDER — ENSURE ENLIVE PO LIQD: 237.0000 mL | Freq: Two times a day (BID) | ORAL | Status: DC

## 2018-10-04 MED ORDER — ADULT MULTIVITAMIN W/MINERALS CH
1.0000 | ORAL_TABLET | Freq: Every day | ORAL | Status: DC
  Administered 2018-10-04 – 2018-10-07 (×4): 1 via ORAL
  Filled 2018-10-04 (×4): qty 1

## 2018-10-04 MED ORDER — INSULIN ASPART 100 UNIT/ML ~~LOC~~ SOLN
0.0000 [IU] | Freq: Every day | SUBCUTANEOUS | Status: DC
  Administered 2018-10-04: 2 [IU] via SUBCUTANEOUS
  Administered 2018-10-05: 3 [IU] via SUBCUTANEOUS
  Administered 2018-10-06: 2 [IU] via SUBCUTANEOUS

## 2018-10-04 MED ORDER — BOOST / RESOURCE BREEZE PO LIQD CUSTOM
1.0000 | Freq: Three times a day (TID) | ORAL | Status: DC
  Administered 2018-10-04: 13:00:00 1 via ORAL

## 2018-10-04 NOTE — Progress Notes (Signed)
Initial Nutrition Assessment  DOCUMENTATION CODES:   Obesity unspecified  INTERVENTION:  -Discontinue Ensure BID  -Update pt dislikes in [facility] -Boost Breeze po TID, each supplement provides 250 kcal and 9 grams of protein (orange) MVI with minerals   NUTRITION DIAGNOSIS:   Inadequate oral intake related to nausea as evidenced by meal completion < 50%, energy intake < 75% for > 7 days, per patient/family report.   GOAL:   Patient will meet greater than or equal to 90% of their needs   MONITOR:   PO intake, Supplement acceptance, I & O's, Labs, Weight trends  REASON FOR ASSESSMENT:   Malnutrition Screening Tool, Consult Assessment of nutrition requirement/status  ASSESSMENT:  73 year old male with medical history significant of CAD s/p stents 6/20, CKDIII, HTN, T2DM, GERD, CHF, hypothyroidism, anemia, OSA, sarcoidosis. Patient presented to ED with progressive SOB with exertion, dry cough, and LE edema despite taking Lasix. Patient admitted for AKI and possible recurrent angina  RD consulted for assessment of nutritional status. Per chart review, consult placed while patient in ED for hypoalbuminemia. Albumin is not an indicator of nutritional status, but rather an indicator of morbidity and mortality, and recovery from acute and chronic illness. Albumin has a half-life of 21 days and is strongly affected by stress response and inflammatory process, therefore, do not expect to see an improvement in this lab value during acute hospitalization.  Spoke with patient via phone this morning who reports feeling slightly better today. Pt reports eating <50% of breakfast this morning (oatmeal, sausage, pancakes, eggs, toast, and coffee) He stated that he did not care for breakfast too much because he dislikes oatmeal and sausage. He is looking forward to lunch and dinner because he has ordered what he likes. RD to report pt dislikes in [facility] system.   Patient endorses  intentional 10lb wt loss over the past few months with an overall goal of losing 20 lbs. He reports cutting out snacks and clearing his pantry of "sweets and junk" and "eating healthy" Patient endorses experiencing periods of nausea s/p stents in June and eats whole grain toast, sometimes with cheese when not feeling well. He reports nausea has been more frequent over the past couple of weeks and along with feeling tired all the time; he just hasn't been eating much lately. Pt does not like Ensure - stated that he has tried them in the past and "they come up as fast as they go down" Patient agreeable to trying Boost Breeze to assist with calorie and protein needs.   Weight history reviewed;  4.72 lb wt loss in the past month, insignificant for time frame. Suspect losses related to fluid status and home Lasix 80mg  BID  Current wt 103.7 kg (+1 mild pitting BUE, +2 moderate pitting LLE)  Medications reviewed and include: Plavix, SSI, Lantus 20 units @ bedtime, synthroid, prednisone PRN zofran - taken this morning  Labs: Glucose 248  K  3  WBC 12.9 Hgb 9.9 Corrected Ca for low albumin 10.2 Lab Results  Component Value Date   HGBA1C 10.2 (H) 10/04/2018     NUTRITION - FOCUSED PHYSICAL EXAM: Unable to complete at this time   Diet Order:   Diet Order            Diet Carb Modified Fluid consistency: Thin; Room service appropriate? Yes  Diet effective now              EDUCATION NEEDS:   No education needs have been  identified at this time  Skin:  Skin Assessment: Reviewed RN Assessment  Last BM:  7/28  Height:   Ht Readings from Last 1 Encounters:  10/03/18 5\' 10"  (1.778 m)    Weight:   Wt Readings from Last 1 Encounters:  10/04/18 103.7 kg    Ideal Body Weight:  75.5 kg  BMI:  Body mass index is 32.82 kg/m.  Estimated Nutritional Needs:   Kcal:  2150-2350  Protein:  98-113 grams  Fluid:  >2.1L/day   Lajuan Lines, RD, LDN  After Hours/Weekend Pager:  [phone]

## 2018-10-04 NOTE — Evaluation (Addendum)
Physical Therapy Evaluation/ Discharge Patient Details Name: [name] MRN: [id number] DOB: 07/17/1945 Today's Date: 10/04/2018   History of Present Illness  73 y.o. male admitted with fatigue. PMHx: CAD, CKD,HTN, DM2,  Hypothyroidism, history of adrenal insufficiency, anemia, OSA, sarcoidosis  Clinical Impression  Pt pleasant with wife in room and able to perform all mobility without assist. Pt reports going shopping and traveling frequently with wife and that fatigue has become his greatest limiting factor. PT able to perform transfers, gait and stairs without assist or SOB and educated for walking program at home to increase endurance. Pt and wife verbalize understanding and are agreeable to no further therapy needs at this time. Pt able to don/doff socks and perform ADLs without assist as well.  HR 74-90 SpO2 95-96% on RA    Follow Up Recommendations No PT follow up    Equipment Recommendations  None recommended by PT    Recommendations for Other Services       Precautions / Restrictions Precautions Precautions: None      Mobility  Bed Mobility Overal bed mobility: Independent                Transfers Overall transfer level: Independent                  Ambulation/Gait Ambulation/Gait assistance: Independent Gait Distance (Feet): 450 Feet Assistive device: None Gait Pattern/deviations: WFL(Within Functional Limits)   Gait velocity interpretation: >4.37 ft/sec, indicative of normal walking speed General Gait Details: pt with steady controlled gait without SOB with gait  Stairs Stairs: Yes Stairs assistance: Modified independent (Device/Increase time) Stair Management: One rail Right;Alternating pattern;Forwards Number of Stairs: 3    Wheelchair Mobility    Modified Rankin (Stroke Patients Only)       Balance Overall balance assessment: No apparent balance deficits (not formally assessed)                                            Pertinent Vitals/Pain Pain Assessment: No/denies pain    Home Living Family/patient expects to be discharged to:: Private residence Living Arrangements: Spouse/significant other;Children Available Help at Discharge: Family;Available 24 hours/day Type of Home: House Home Access: Stairs to enter   [employer] of Steps: 4 Home Layout: One level Home Equipment: Walker - 2 wheels;Cane - single point;Bedside commode;Shower seat;Wheelchair - [employer] - 4 wheels      Prior Function Level of Independence: Independent               Hand Dominance        Extremity/Trunk Assessment   Upper Extremity Assessment Upper Extremity Assessment: Overall WFL for tasks assessed    Lower Extremity Assessment Lower Extremity Assessment: Overall WFL for tasks assessed    Cervical / Trunk Assessment Cervical / Trunk Assessment: Normal  Communication   Communication: No difficulties  Cognition Arousal/Alertness: Awake/alert Behavior During Therapy: WFL for tasks assessed/performed Overall Cognitive Status: Within Functional Limits for tasks assessed                                        General Comments      Exercises     Assessment/Plan    PT Assessment Patent does not need any further PT services  PT Problem List  PT Treatment Interventions      PT Goals (Current goals can be found in the Care Plan section)  Acute Rehab PT Goals PT Goal Formulation: All assessment and education complete, DC therapy    Frequency     Barriers to discharge        Co-evaluation               AM-PAC PT "6 Clicks" Mobility  Outcome Measure Help needed turning from your back to your side while in a flat bed without using bedrails?: None Help needed moving from lying on your back to sitting on the side of a flat bed without using bedrails?: None Help needed moving to and from a bed to a chair (including a  wheelchair)?: None Help needed standing up from a chair using your arms (e.g., wheelchair or bedside chair)?: None Help needed to walk in hospital room?: None Help needed climbing 3-5 steps with a railing? : None 6 Click Score: 24    End of Session Equipment Utilized During Treatment: Gait belt Activity Tolerance: Patient tolerated treatment well Patient left: in chair;with chair alarm set;with family/visitor present Nurse Communication: Mobility status PT Visit Diagnosis: Other abnormalities of gait and mobility (R26.89)    Time: 5176-1607 PT Time Calculation (min) (ACUTE ONLY): 19 min   Charges:   PT Evaluation $PT Eval Low Complexity: [place], PT Acute Rehabilitation Services Pager: [phone] Office: [phone]   Sandy Salaam Bodey Frizell 10/04/2018, 12:39 PM

## 2018-10-04 NOTE — Consult Note (Addendum)
Cardiology Consultation:   Patient ID: CHRISTY FRIEDE MRN: [id number]; DOB: [birth year]  Admit date: 10/03/2018 Date of Consult: 10/04/2018  Primary Care Provider: Shirline Frees, MD Primary Cardiologist: Candee Furbish, MD  Primary Electrophysiologist:  None    Patient Profile:   JABRI BLANCETT is a 73 y.o. male with a hx of CKD 3, OSA on CPAP, Chronic Diastolic Heart Failure, chronic lower extremity edema, h/o Sarcoidosis adrenal insufficiency on chronic steroids, CAD s/pDES x1 in Ost RPDA and DES to Ost RPDA to RPDA , Hypertension, Hyperlipidemia who is being seen today for the evaluation of chest pain at the request of Dr. Algis Liming.   History of Present Illness:  Patient was followed by Dr. Marlou Porch for the above cardiac problems. Patient had drug eluting stent to LAD and posterior lateral branch 10/2009. Repeat cath in 2013 showing patent LAD stent, normal LV systolic function, LVEDP 17, and EF 55%. Patient was having chronic sob at that time. Patient was taking Lasix for lower extremity edema. Patient was not tolerating BB or statins. Subsequent catheterization was performed on 04/01/14 which resulted in continued medical management. There was moderate diffuse disease in the small caliber distal LAD segment (no report found in EHR).   Earlier this year patient was seeing Dr. Marlou Porch in the office for worsening lower extremity edema despite being on Lasix 80mg  BID. Metolazone was stopped due to urinary retention. Echo was ordered showing 60-65% with impaired relaxation, tricuspid aortic valve with mild thickening. Follow up in the office 4/23 and R/L heart cath was set up.   Patient was admitted 5/27-6/2 for ongoing chest pain, sob, and edema and underwent cardiac cath 08/07/18.  2 stents were placed. DES x1 in [place] and DES to [place] to RPDA; previously placed Post Atrio stent was widely patent, prox LAD 40% stenosis, LV end was normal, mid Cx 50% stenosed. Right heart cath was normal. DAPT with ASA and  Plavix was continued. Patient was stable at discharge and had felt much better. He followed up with Dr. Marlou Porch 6/19 and felt somewhat fatigued. Crestor 10mg  was started. Patient was otherwise stable.   Patient called the office for chest soreness on the left side with associated sob. He also reported a dry cough and chills, but no fever. It was recommended patient go to the ER for further evaluation given ongoing chest pain, recent stenting, and potential infective symptoms.   Mr. Drost presented to the ER 7/29 for worsening chest pain/sob for the last 3 weeks. Patient would have daily chest tightness along with nausea that would last for multiple hours. Nausea has been a chronic problem with no known precipitating events. Patient complained of  weakness as well. Patient also had lower leg edema despite being on Lasix 80mg  BID. B/P 122/53, 65 pulse, Temp 99.3, Resp 14. HS troponin 13>>15. BNP 42. Potassium 3.0, Mag 1.6, Cr 3.05, Hgb 11.2>9.9. CT chest showed right middle lobe scarring, possible related to patient's known sarcoidosis. Stable right lung nodules. Patients lasix was held and he was admitted for further work-up.   Heart Pathway Score:     Past Medical History:  Diagnosis Date  . Adrenal insufficiency ([place])   . Allergy   . Anemia   . Arthritis    feet   . CKD (chronic kidney disease), stage III ([place])   . Coronary artery disease    has stents  . Coronary atherosclerosis of native coronary artery    Proximal LAD, posterior lateral stent widely patent-10/12/11  .  Diabetes mellitus    insulin and pills  . Hearing loss    wears hearing aids  . Heart attack ([place]) 2010  . History of blood transfusion 06/30/2016   [facility] - 2 units transfused  . Hypertension   . OSA (obstructive sleep apnea)    uses VPAC sleep study 2 years done through [facility]. Dr. Marlou Porch arranged study  . Pneumonia    3-4 years ago  . Sarcoid   . Sleep apnea    uses CPAP nightly  . Thyroid disease   . Type 2  diabetes mellitus ([place])     Past Surgical History:  Procedure Laterality Date  . APPENDECTOMY    . CATARACT EXTRACTION  2011   bilat  . CHOLECYSTECTOMY  01/25/2011   Procedure: LAPAROSCOPIC CHOLECYSTECTOMY WITH INTRAOPERATIVE CHOLANGIOGRAM;  Surgeon: Judieth Keens, DO;  Location: [facility];  Service: General;  Laterality: N/A;  . COLONOSCOPY     several  . CORONARY ANGIOPLASTY     most recent 11/2009  . CORONARY STENT INTERVENTION N/A 08/07/2018   Procedure: CORONARY STENT INTERVENTION;  Surgeon: Jettie Booze, MD;  Location: [facility];  Service: Cardiovascular;  Laterality: N/A;  . CORONARY STENT PLACEMENT  2009   in LAD and side branch PTCA  . INTERCOSTAL NERVE BLOCK  2011, 06/2016   x2. lumbar spine  . LEFT HEART CATHETERIZATION WITH CORONARY ANGIOGRAM Bilateral 10/12/2011   Procedure: LEFT HEART CATHETERIZATION WITH CORONARY ANGIOGRAM;  Surgeon: Candee Furbish, MD;  Location: [facility] CATH LAB;  Service: Cardiovascular;  Laterality: Bilateral;  . LEFT HEART CATHETERIZATION WITH CORONARY ANGIOGRAM N/A 04/01/2014   Procedure: LEFT HEART CATHETERIZATION WITH CORONARY ANGIOGRAM;  Surgeon: Candee Furbish, MD;  Location: [facility] CATH LAB;  Service: Cardiovascular;  Laterality: N/A;  . RIGHT/LEFT HEART CATH AND CORONARY ANGIOGRAPHY N/A 08/07/2018   Procedure: RIGHT/LEFT HEART CATH AND CORONARY ANGIOGRAPHY;  Surgeon: Jettie Booze, MD;  Location: [facility] [facility] CV LAB;  Service: Cardiovascular;  Laterality: N/A;     Home Medications:  Prior to Admission medications   Medication Sig Start Date End Date Taking? Authorizing Provider  acetaminophen (TYLENOL) 500 MG tablet Take 500 mg by mouth daily as needed for moderate pain or headache.    Yes [provider]  albuterol (PROVENTIL) (2.5 MG/3ML) 0.083% nebulizer solution Take 3 mLs (2.5 mg total) by nebulization every 6 (six) hours as needed for wheezing or shortness of breath. Dx Code D86.0 07/24/14  Yes Elsie Stain, MD  Albuterol  Sulfate (PROAIR RESPICLICK) 193 (90 BASE) MCG/ACT AEPB Inhale 2 puffs into the lungs every 6 (six) hours as needed. 07/24/14  Yes Elsie Stain, MD  aspirin EC 81 MG tablet Take 81 mg by mouth every evening.    Yes [provider]  Cholecalciferol (VITAMIN D3) 3000 UNITS TABS Take 3,000 Units by mouth daily.   Yes [provider]  Coenzyme Q10 (COQ-10) 400 MG CAPS Take 400 mg by mouth every evening.   Yes [provider]  fish oil-omega-3 fatty acids 1000 MG capsule Take 1 g by mouth daily.    Yes [provider]  furosemide (LASIX) 80 MG tablet Take 80 mg by mouth See admin instructions. Take 80 mg twice daily, may take a third 80 mg dose as needed for swelling   Yes [provider]  insulin aspart protamine-insulin aspart (NOVOLOG 70/30) (70-30) 100 UNIT/ML injection Inject 40-45 Units into the skin 3 (three) times daily as needed (CBG >100).  Yes [provider]  Vitamin D, Ergocalciferol, (DRISDOL) 50000 UNITS CAPS capsule Take 50,000 Units by mouth every Sunday.  12/21/13  Yes [provider]  clopidogrel (PLAVIX) 75 MG tablet Take 1 tablet (75 mg total) by mouth daily. 04/17/14   Jerline Pain, MD  glipiZIDE (GLUCOTROL) 10 MG tablet Take 10 mg by mouth every evening.     [provider]  levothyroxine (SYNTHROID, LEVOTHROID) 50 MCG tablet Take 50 mcg by mouth daily.  06/21/16   [provider]  losartan (COZAAR) 100 MG tablet Take 100 mg by mouth daily.    [provider]  montelukast (SINGULAIR) 10 MG tablet Take 10 mg by mouth daily as needed (allergies).  07/18/18   [provider]  pantoprazole (PROTONIX) 40 MG tablet Take 40 mg by mouth daily. 03/21/18   [provider]  potassium chloride (KLOR-CON M10) 10 MEQ tablet Take 2 tablets (20 mEq total) by mouth 2 (two) times daily. 03/26/14   Elsie Stain, MD  predniSONE (DELTASONE) 10 MG tablet Take 1 tablet (10 mg total) by mouth  daily with breakfast. T 05/15/15   Parrett, Tammy S, NP  RABEprazole (ACIPHEX) 20 MG tablet Take 1 tablet (20 mg total) by mouth 2 (two) times daily as needed. 12/07/15   Rigoberto Noel, MD  rosuvastatin (CRESTOR) 10 MG tablet Take 1 tablet (10 mg total) by mouth at bedtime. 08/24/18   Jerline Pain, MD  sodium chloride (OCEAN) 0.65 % SOLN nasal spray Place 1 spray into both nostrils as needed for congestion.    [provider]    Inpatient Medications: Scheduled Meds: . aspirin EC  81 mg Oral QPM  . clopidogrel  75 mg Oral Daily  . enoxaparin (LOVENOX) injection  30 mg Subcutaneous Q24H  . feeding supplement  1 Container Oral TID BM  . insulin aspart  0-9 Units Subcutaneous Q4H  . insulin glargine  20 Units Subcutaneous QHS  . levothyroxine  50 mcg Oral Q0600  . multivitamin with minerals  1 tablet Oral Daily  . pantoprazole  40 mg Oral Daily  . predniSONE  10 mg Oral Q breakfast  . rosuvastatin  10 mg Oral QHS   Continuous Infusions:  PRN Meds: acetaminophen **OR** acetaminophen, albuterol, HYDROcodone-acetaminophen, montelukast, ondansetron **OR** ondansetron (ZOFRAN) IV  Allergies:    Allergies  Allergen Reactions  . Hydrocortisone Nausea Only  . Metolazone     Drained, no energy  . Statins Other (See Comments)    Pt says they make him "mean"    Social History:   Social History   Socioeconomic History  . Marital status: Married    Spouse name: Not on file  . Number of children: 3  . Years of education: Not on file  . Highest education level: Not on file  Occupational History  . 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 Occupation: Best boy: [employer]  . Financial resource strain: Not on file  . Food insecurity    Worry: Not on file    Inability: Not on file  . Transportation needs    Medical: Not on file    Non-medical: Not on file  Tobacco Use  . Smoking status: Never Smoker  . Smokeless tobacco: Never  Used  Substance and Sexual Activity  . Alcohol use: No  . Drug use: No  . Sexual activity: Not on file  Lifestyle  . Physical activity  Days per week: Not on file    Minutes per session: Not on file  . Stress: Not on file  Relationships  . Social Herbalist on phone: Not on file    Gets together: Not on file    Attends religious service: Not on file    Active member of club or organization: Not on file    Attends meetings of clubs or organizations: Not on file    Relationship status: Not on file  . Intimate partner violence    Fear of current or ex partner: Not on file    Emotionally abused: Not on file    Physically abused: Not on file    Forced sexual activity: Not on file  Other Topics Concern  . Not on file  Social History Narrative  . Not on file    Family History:   Family History  Problem Relation Age of Onset  . Kidney disease Mother   . Diabetes Mother   . Kidney cancer Mother   . Heart attack Father   . Asthma Sister   . Anesthesia problems Sister        "Kidney's did not wake up"  . Colon cancer Neg Hx   . Colon polyps Neg Hx   . Rectal cancer Neg Hx   . Stomach cancer Neg Hx      ROS:  Please see the history of present illness.  All other ROS reviewed and negative.     Physical Exam/Data:   Vitals:   10/03/18 2207 10/04/18 0441 10/04/18 0617 10/04/18 0746  BP: 140/65 115/61  130/61  Pulse: 73 80  81  Resp: 18 18  18   Temp: 98.2 F (36.8 C) 100.3 F (37.9 C) 99.4 F (37.4 C) 99.2 F (37.3 C)  TempSrc: Oral Oral  Oral  SpO2: 100% 100%    Weight: 103.9 kg 103.7 kg    Height: 5\' 10"  (1.778 m)       Intake/Output Summary (Last 24 hours) at 10/04/2018 1126 Last data filed at 10/04/2018 0755 Gross per 24 hour  Intake 1299.05 ml  Output 850 ml  Net 449.05 ml   Last 3 Weights 10/04/2018 10/03/2018 10/03/2018  Weight (lbs) 228 lb 11.2 oz 229 lb 238 lb  Weight (kg) 103.738 kg 103.874 kg 107.956 kg     Body mass index is 32.82 kg/m.   General:  Well nourished, well developed WM, in no acute distress HEENT: normal Lymph: no adenopathy Neck: mild JVD Endocrine:  No thryomegaly Vascular: No carotid bruits; FA pulses 2+ bilaterally without bruits  Cardiac:  normal S1, S2; RRR; no murmur  Lungs:  clear to auscultation bilaterally, no wheezing, rhonchi or rales  Abd: soft, nontender, no hepatomegaly  Ext: no significant edema  Musculoskeletal:  No deformities, BUE and BLE strength normal and equal Skin: warm and dry  Neuro:  CNs 2-12 intact, no focal abnormalities noted Psych:  Normal affect   EKG:  The EKG was personally reviewed and demonstrates:  Sinus tachycardia, HR 101, LAD, possible LAFB, poor r wave progression, TWI aVL Telemetry:  Telemetry was personally reviewed and demonstrates:  NSR, Heart rates 70s; possible accelerated junctional rhythm (event 10:00pm 2/19 and 5:40 am 7/30)  Relevant CV Studies:  Left Heart Cath 08/07/18  Prox RCA to Mid RCA lesion is 25% stenosed.  Ost RPDA lesion is 80% stenosed. The PDA is a long vessel that feeds the apex.  A drug-eluting stent was successfully placed using a STENT  RESOLUTE ONYX 2.25X8.  Post intervention, there is a 0% residual stenosis.  Ost RPDA to RPDA lesion is 70% stenosed.  A drug-eluting stent was successfully placed using a STENT RESOLUTE ONYX 2.0X30.  Post intervention, there is a 0% residual stenosis.  Previously placed Post Atrio stent (unknown type) is widely patent.  Prox LAD stent has a focal 40% restenosis.  LV end diastolic pressure is normal.  There is no aortic valve stenosis.  Mid Cx lesion is 50% stenosed.  Ao sat: 99%, PA sat 75%, mean PA 18 mm Hg; mean PCWP 12 mm Hg; CO 6.9 L/min; CI 3.1  Echo 07/27/18 1. The left ventricle has normal systolic function with an ejection fraction of 60-65%. The cavity size was normal. There is mildly increased left ventricular wall thickness. Left ventricular diastolic Doppler parameters are  consistent with impaired  relaxation.  2. The right ventricle has normal systolic function. The cavity was normal. There is no increase in right ventricular wall thickness.  3. The aortic valve is tricuspid. Mild thickening of the aortic valve. Mild calcification of the aortic valve.   Laboratory Data:  High Sensitivity Troponin:   Recent Labs  Lab 10/03/18 1430 10/03/18 1628  TROPONINIHS 13 15     Cardiac EnzymesNo results for input(s): TROPONINI in the last 168 hours. No results for input(s): TROPIPOC in the last 168 hours.  Chemistry Recent Labs  Lab 10/03/18 1430 10/04/18 0643  NA 138 137  K 3.4* 3.0*  CL 99 101  CO2 21* 26  GLUCOSE 241* 144*  BUN 41* 40*  CREATININE 3.24* 3.05*  CALCIUM 8.8* 8.5*  GFRNONAA 18* 19*  GFRAA 21* 23*  ANIONGAP 18* 10    Recent Labs  Lab 10/03/18 1923 10/04/18 0643  PROT 7.0 6.1*  ALBUMIN 3.2* 2.9*  AST 14* 13*  ALT 16 14  ALKPHOS 52 46  BILITOT 0.9 0.6   Hematology Recent Labs  Lab 10/03/18 1430 10/03/18 1923 10/04/18 0643  WBC 14.5* 13.6* 12.9*  RBC 3.74* 3.57* 3.16*  3.16*  HGB 11.6* 11.2* 9.9*  HCT 36.4* 34.7* 30.4*  MCV 97.3 97.2 96.2  MCH 31.0 31.4 31.3  MCHC 31.9 32.3 32.6  RDW 13.2 13.2 13.1  PLT 339 278 259   BNP Recent Labs  Lab 10/03/18 1643  BNP 42.4    DDimer No results for input(s): DDIMER in the last 168 hours.   Radiology/Studies:  Dg Chest 2 View  Result Date: 10/03/2018 CLINICAL DATA:  Chest pain for 4 weeks, coronary stent put in 4 weeks ago, diabetes mellitus, coronary artery disease post MI, hypertension, sarcoid EXAM: CHEST - 2 VIEW COMPARISON:  06/15/2016, 06/25/2015 Correlation: CT chest 02/20/2014 FINDINGS: Enlargement of cardiac silhouette. Blunting of the RIGHT costophrenic angle by large fat pad on prior CT. Mediastinal contours and pulmonary vascularity otherwise normal. Chronic interstitial prominence in the lower lungs bilaterally, greater on RIGHT, question related to chronic  interstitial lung disease from sarcoidosis, unchanged since 06/25/2015. No definite acute infiltrate, pleural effusion or pneumothorax. Osseous structures unremarkable. IMPRESSION: Chronic basilar interstitial disease greater on RIGHT. Mild enlargement of cardiac silhouette with prominent RIGHT epicardial fat pad. No acute abnormalities. Electronically Signed   By: Lavonia Dana M.D.   On: 10/03/2018 15:14   Ct Chest Wo Contrast  Result Date: 10/03/2018 CLINICAL DATA:  Sarcoidosis EXAM: CT CHEST WITHOUT CONTRAST TECHNIQUE: Multidetector CT imaging of the chest was performed following the standard protocol without IV contrast. COMPARISON:  Chest radiographs dated 10/03/2018. CT chest dated  02/20/2014. FINDINGS: Cardiovascular: The heart is normal in size. No pericardial effusion. No evidence of thoracic aortic aneurysm. Mild atherosclerotic calcifications of the aortic arch. Coronary atherosclerosis with LAD stent. Mediastinum/Nodes: No suspicious mediastinal lymphadenopathy. Visualized thyroid is unremarkable. Lungs/Pleura: 7 mm subpleural nodule in the medial right lower lobe (series 8/image 37), unchanged from 2015, benign. Additional triangular subpleural nodule in the right lower lobe (series 8/image 96), unchanged, benign. Additional subpleural nodule in the anterior right upper lobe (series 8/image 72), unchanged, benign. Scarring in the right middle lobe, chronic, possibly related to the patient's known sarcoidosis. No focal consolidation. No pleural effusion or pneumothorax. Upper Abdomen: Visualized upper abdomen is notable for hepatic steatosis and prior cholecystectomy. Musculoskeletal: Visualized osseous structures are within normal limits. IMPRESSION: Right middle lobe scarring, possibly related to the patient's known sarcoidosis. Stable small right lung nodules, unchanged from 2015, benign. Dedicated follow-up imaging is not required per Fleischner Society guidelines. This recommendation follows the  consensus statement: Guidelines for Management of Small Pulmonary Nodules Detected on CT Images: From the Fleischner Society 2017; Radiology 2017; 284:228-243. No evidence of acute cardiopulmonary disease. Aortic Atherosclerosis (ICD10-I70.0). Electronically Signed   By: Julian Hy M.D.   On: 10/03/2018 22:17   US Renal  Result Date: 10/03/2018 CLINICAL DATA:  Renal failure.  Chronicity not specified. EXAM: RENAL / URINARY TRACT ULTRASOUND COMPLETE COMPARISON:  CT 04/20/2018 FINDINGS: Right Kidney: Renal measurements: 12.4 x 5.1 x 5.7 cm = volume: 188.7 mL. Echogenicity within normal limits. Two small cyst in the right kidney measuring 1.2 and 1.3 cm respectively. No solid mass or hydronephrosis visualized. Left Kidney: Renal measurements: 11.8 x 5.8 x 6.4 cm = volume: 227 mL. Echogenicity within normal limits. No mass or hydronephrosis visualized. Bladder: Appears normal for degree of bladder distention. IMPRESSION: Unremarkable renal ultrasound.  No obstructive uropathy. Incidental small right renal cysts. Electronically Signed   By: Keith Rake M.D.   On: 10/03/2018 21:23    Assessment and Plan:   1. Chest pain/CAD s/p DES x2  Patient had admission in June for ongoing cp/sob and underwent cardiac cath. He received DES x1 in [place] and DES x 1 to Ost RPDA to RPDA; previously placed Post Atrio stent was widely patent, prox LAD 40% stenosis, LV end was normal, mid Cx 50% stenosed. Patient was feeling better at OP f/u. 6/29 patient called about intermittent chest pain/sob/edema and was told to go to the ER.  -HS troponin 13>15. BNP 42. Potassium 3.0, Mag 1.6. EKG with no ischemic changes -Cr 3.24>3.05  (baseline 1.4-1.5) lasix was held -Hgb 11.2>9.9  -ASA 81, Plavix 75mg , Crestor 10mg ,  -Appears Chest tightness is a chronic issue. Will not likely pursue further ischemic work-up at this time   2. Diastolic CHF -Echo 60-45% 07/27/18 -Patient has history of chronic severe lower leg edema  was taking Lasix 80mg  BID at home.  -Lasix was held due to AKI  -Pedal edema on exam. Possibly due to low albumin -Continue to hold Lasix, follow creatinine  3. AKI -Cr 3.24>3.05   -Baseline 1.4-1.5 -Lasix held -Avoid nephrotoxins  4.Anemia -Hgb drop 11.2>9.9  -Iron 22 -Work-up per IM -daily CBC  5. Hypokalemia -3.0 on admission -will order 40mg  one time dose  -repeat BMET  6.Hyperlipidemia -LDL 133, HDL 32, TG 269 -Recently started on Crestor 10mg  6/19  7. DM2 -Poorly uncontrolled A1C 10.2 7/30 -per IM  8. Obesity -BMI 32.8 -Lifestyle changes   For questions or updates, please contact [place] Please consult [email or web address]  for contact info under     Signed, Cadence Ninfa Meeker, PA-C  10/04/2018 11:26 AM   Attending Note:   The patient was seen and examined.  Agree with assessment and plan as noted above.  Changes made to the above note as needed.  Patient seen and independently examined with Cadence Kathlen Mody, PA .   We discussed all aspects of the encounter. I agree with the assessment and plan as stated above.  1.   Dyspnea with exertion :  Has a hx of chronic diastolic CHF  but  By my evaluation, he is volume depleted.   His creatinine is acutely higher.     By my exam, he has no leg edema ( he had reported that has has had progressive edema recently )   He denies any nausea, vomitting , diarrhea Admits that he does not drink enough water - seems to drink soft drinks regularly .   His symptoms are not c/w his previous angina . I suspect the symptoms may be due to acute renal insufficiency and the metabolic changes associated with that.    Ive discussed with Dr. Algis Liming who will start gentle hydration.   2.  Diabetes mellitus:   Poorly controlled.   HbA1C is > 10  Needs better glucose regulation   3.  Hyperlipidemia:   Cont. crestor     I have spent a total of 40 minutes with patient reviewing hospital  notes , telemetry, EKGs, labs and examining  patient as well as establishing an assessment and plan that was discussed with the patient. > 50% of time was spent in direct patient care.    Thayer Headings, Brooke Bonito., MD, [facility] 10/04/2018, 2:28 PM [address],  [place] Pager [phone]

## 2018-10-04 NOTE — Progress Notes (Signed)
PROGRESS NOTE   [name]  NWG:[id number]    DOB: 07-27-1945    DOA: 10/03/2018  PCP: Shirline Frees, MD   I have briefly reviewed patients previous medical records in [facility].  Chief Complaint  Patient presents with  . Chest Pain    Brief Narrative:  73 year old married male with PMH of CAD s/p stents x2 early July 5784, chronic diastolic CHF, DM 2, hypothyroid, HTN, HLD, CKD 3, OSA on CPAP, sarcoidosis and adrenal insufficiency on chronic prednisone, chronic leg edema, did relatively well for a week post stenting and then noted progressive dyspnea on exertion, fatigue, chest discomfort, worsening leg edema, nausea and right upper extremity pain.  Admitted for acute on chronic kidney disease.  Cardiology consulted.   Assessment & Plan:   Active Problems:   Sarcoidosis   Diabetes mellitus without complication ([place])   Coronary artery disease   GERD   Sleep apnea   Coronary atherosclerosis of native coronary artery   Chronic diastolic heart failure (HCC)   Pulmonary sarcoidosis (HCC)   Uncontrolled diabetes mellitus (HCC)   Hyperlipidemia   CKD (chronic kidney disease), stage III (HCC)   Iron deficiency anemia   AKI (acute kidney injury) (HCC)   Hypoalbuminemia   Hypokalemia   Hypothyroidism   Acute kidney injury superimposed on chronic kidney disease ([place])   Acute kidney injury complicating stage III chronic kidney disease  Baseline creatinine probably in the 1.4-1.5 range.  Creatinine on 6/2: 1.41.  Admitted with creatinine of 3.24.  Acute kidney injury likely due to dehydration from poor oral intake, ongoing high-dose diuretic use and ARB.  Held Lasix and ARB.  Renal ultrasound without obstructive uropathy.  Gently hydrated overnight and creatinine has improved to 3.05.  Continue gentle IV fluids overnight and follow BMP in a.m.  His symptoms of generalized weakness and nausea may be related to this.  Dyspnea on exertion Unclear etiology.  Clinically dehydrated and not in florid CHF. CT chest without contrast shows no acute cardiopulmonary disease. May be related to some uremic symptoms from acute kidney injury, obesity/OSA and generalized deconditioning. Treat above and monitor closely.  Uncontrolled DM2 with hyperglycemia and renal complications  O9G >29  Likely complicated by chronic prednisone use.  Continue Lantus 20 units nightly and changed SSI to sensitive with bedtime scale.  Adjust insulins as needed.  Hyperlipidemia Continue statins.  CAD s/p DES x2 Cardiology input appreciated.  Discussed with Dr. Acie Fredrickson. No typical angina symptoms and clinical picture not consistent with ACS. Continue aspirin, Plavix, Crestor States that he has been having right upper extremity pain since cardiac cath but no acute findings on exam  Chronic diastolic CHF TTE 08/02/4130: LVEF 60-65%. Currently clinically dehydrated.  Lasix on hold.  Anemia in CKD Hemoglobin stable.  Marginal drop may be due to hemodilution.  No bleeding reported.  Follow CBC in a.m.  May have element of iron deficiency anemia as well.  Hypokalemia/hypomagnesemia Replace and follow.  Obesity Estimated body mass index is 32.82 kg/m as calculated from the following:   Height as of this encounter: 5\' 10"  (1.778 m).   Weight as of this encounter: 103.7 kg.   .malnutritionnote Nutrition Problem: Inadequate oral intake Etiology: nausea Signs/Symptoms: meal completion < 50%, energy intake < 75% for > 7 days, per patient/family report Interventions: Boost Breeze, MVI   Sarcoidosis  CT chest without contrast without acute cardiopulmonary findings.  Continue chronic prednisone 10 mg daily.  GERD Continue PPI  OSA on CPAP Continue CPAP.  Hypothyroid Continue Synthroid.   DVT prophylaxis: Lovenox Code Status: Full Family Communication: Discussed in detail with patient spouse at bedside, updated care and answered questions. Disposition: DC  home pending clinical improvement.  Patient was admitted as observation status.  However he still has significant acute on chronic kidney disease, dehydration, hypokalemia, hypomagnesemia and ongoing dyspnea on exertion that requires further evaluation and management.  He is appropriate for inpatient status, changed.   Consultants:  Cardiology  Procedures:  None  Antimicrobials:  None   Subjective: Ongoing dyspnea with minimal exertion.  No chest pain.  Overall feels poorly.  Lack of energy and weak.  Not significantly improved compared to admission.  Objective:  Vitals:   10/04/18 0441 10/04/18 0617 10/04/18 0746 10/04/18 1133  BP: 115/61  130/61 128/64  Pulse: 80  81 65  Resp: 18  18 18   Temp: 100.3 F (37.9 C) 99.4 F (37.4 C) 99.2 F (37.3 C) 98.6 F (37 C)  TempSrc: Oral  Oral Oral  SpO2: 100%   99%  Weight: 103.7 kg     Height:        Examination:  General exam: Pleasant elderly male, moderately built and obese lying comfortably propped up in bed.  Oral mucosa dry. Respiratory system: Clear to auscultation. Respiratory effort normal. Cardiovascular system: S1 & S2 heard, RRR. No JVD, murmurs, rubs, gallops or clicks. No pedal edema.  Telemetry personally reviewed: Sinus rhythm. Gastrointestinal system: Abdomen is nondistended, soft and nontender. No organomegaly or masses felt. Normal bowel sounds heard. Central nervous system: Alert and oriented. No focal neurological deficits. Extremities: Symmetric 5 x 5 power. Skin: No rashes, lesions or ulcers Psychiatry: Judgement and insight appear normal. Mood & affect appropriate.     Data Reviewed: I have personally reviewed following labs and imaging studies  CBC: Recent Labs  Lab 10/03/18 1430 10/03/18 1923 10/04/18 0643  WBC 14.5* 13.6* 12.9*  NEUTROABS  --  10.4*  --   HGB 11.6* 11.2* 9.9*  HCT 36.4* 34.7* 30.4*  MCV 97.3 97.2 96.2  PLT 339 278 542   Basic Metabolic Panel: Recent Labs  Lab 10/03/18  1430 10/03/18 1923 10/04/18 0643  NA 138  --  137  K 3.4*  --  3.0*  CL 99  --  101  CO2 21*  --  26  GLUCOSE 241*  --  144*  BUN 41*  --  40*  CREATININE 3.24*  --  3.05*  CALCIUM 8.8*  --  8.5*  MG  --  1.8 1.6*  PHOS  --  4.8* 3.8   Liver Function Tests: Recent Labs  Lab 10/03/18 1923 10/04/18 0643  AST 14* 13*  ALT 16 14  ALKPHOS 52 46  BILITOT 0.9 0.6  PROT 7.0 6.1*  ALBUMIN 3.2* 2.9*    Cardiac Enzymes: No results for input(s): CKTOTAL, CKMB, CKMBINDEX, TROPONINI in the last 168 hours.  CBG: Recent Labs  Lab 10/04/18 0002 10/04/18 0438 10/04/18 0920 10/04/18 1153  GLUCAP 203* 150* 248* 213*    Recent Results (from the past 240 hour(s))  SARS Coronavirus 2 (CEPHEID - Performed in [place] hospital lab), Hosp Order     Status: None   Collection Time: 10/03/18  4:50 PM   Specimen: Urine, Clean Catch; Nasopharyngeal  Result Value Ref Range Status   SARS Coronavirus 2 NEGATIVE NEGATIVE Final    Comment: (NOTE) If result is NEGATIVE SARS-CoV-2 target nucleic acids are NOT DETECTED. The SARS-CoV-2 RNA is generally detectable in upper and  lower  respiratory specimens during the acute phase of infection. The lowest  concentration of SARS-CoV-2 viral copies this assay can detect is 250  copies / mL. A negative result does not preclude SARS-CoV-2 infection  and should not be used as the sole basis for treatment or other  patient management decisions.  A negative result may occur with  improper specimen collection / handling, submission of specimen other  than nasopharyngeal swab, presence of viral mutation(s) within the  areas targeted by this assay, and inadequate number of viral copies  (<250 copies / mL). A negative result must be combined with clinical  observations, patient history, and epidemiological information. If result is POSITIVE SARS-CoV-2 target nucleic acids are DETECTED. The SARS-CoV-2 RNA is generally detectable in upper and lower   respiratory specimens dur ing the acute phase of infection.  Positive  results are indicative of active infection with SARS-CoV-2.  Clinical  correlation with patient history and other diagnostic information is  necessary to determine patient infection status.  Positive results do  not rule out bacterial infection or co-infection with other viruses. If result is PRESUMPTIVE POSTIVE SARS-CoV-2 nucleic acids MAY BE PRESENT.   A presumptive positive result was obtained on the submitted specimen  and confirmed on repeat testing.  While 2019 novel coronavirus  (SARS-CoV-2) nucleic acids may be present in the submitted sample  additional confirmatory testing may be necessary for epidemiological  and / or clinical management purposes  to differentiate between  SARS-CoV-2 and other Sarbecovirus currently known to infect humans.  If clinically indicated additional testing with an alternate test  methodology [id number]) is advised. The SARS-CoV-2 RNA is generally  detectable in upper and lower respiratory sp ecimens during the acute  phase of infection. The expected result is Negative. Fact Sheet for Patients:  StrictlyIdeas.no Fact Sheet for Healthcare Providers: BankingDealers.co.za This test is not yet approved or cleared by the [place] FDA and has been authorized for detection and/or diagnosis of SARS-CoV-2 by FDA under an Emergency Use Authorization (EUA).  This EUA will remain in effect (meaning this test can be used) for the duration of the COVID-19 declaration under Section 564(b)(1) of the Act, 21 U.S.C. section 360bbb-3(b)(1), unless the authorization is terminated or revoked sooner. Performed at [facility], [place] [address], [place], [place] [address]          Radiology Studies: Dg Chest 2 View  Result Date: 10/03/2018 CLINICAL DATA:  Chest pain for 4 weeks, coronary stent put in 4 weeks ago, diabetes mellitus,  coronary artery disease post MI, hypertension, sarcoid EXAM: CHEST - 2 VIEW COMPARISON:  06/15/2016, 06/25/2015 Correlation: CT chest 02/20/2014 FINDINGS: Enlargement of cardiac silhouette. Blunting of the RIGHT costophrenic angle by large fat pad on prior CT. Mediastinal contours and pulmonary vascularity otherwise normal. Chronic interstitial prominence in the lower lungs bilaterally, greater on RIGHT, question related to chronic interstitial lung disease from sarcoidosis, unchanged since 06/25/2015. No definite acute infiltrate, pleural effusion or pneumothorax. Osseous structures unremarkable. IMPRESSION: Chronic basilar interstitial disease greater on RIGHT. Mild enlargement of cardiac silhouette with prominent RIGHT epicardial fat pad. No acute abnormalities. Electronically Signed   By: Lavonia Dana M.D.   On: 10/03/2018 15:14   Ct Chest Wo Contrast  Result Date: 10/03/2018 CLINICAL DATA:  Sarcoidosis EXAM: CT CHEST WITHOUT CONTRAST TECHNIQUE: Multidetector CT imaging of the chest was performed following the standard protocol without IV contrast. COMPARISON:  Chest radiographs dated 10/03/2018. CT chest dated 02/20/2014. FINDINGS: Cardiovascular: The heart  is normal in size. No pericardial effusion. No evidence of thoracic aortic aneurysm. Mild atherosclerotic calcifications of the aortic arch. Coronary atherosclerosis with LAD stent. Mediastinum/Nodes: No suspicious mediastinal lymphadenopathy. Visualized thyroid is unremarkable. Lungs/Pleura: 7 mm subpleural nodule in the medial right lower lobe (series 8/image 37), unchanged from 2015, benign. Additional triangular subpleural nodule in the right lower lobe (series 8/image 96), unchanged, benign. Additional subpleural nodule in the anterior right upper lobe (series 8/image 72), unchanged, benign. Scarring in the right middle lobe, chronic, possibly related to the patient's known sarcoidosis. No focal consolidation. No pleural effusion or pneumothorax.  Upper Abdomen: Visualized upper abdomen is notable for hepatic steatosis and prior cholecystectomy. Musculoskeletal: Visualized osseous structures are within normal limits. IMPRESSION: Right middle lobe scarring, possibly related to the patient's known sarcoidosis. Stable small right lung nodules, unchanged from 2015, benign. Dedicated follow-up imaging is not required per Fleischner Society guidelines. This recommendation follows the consensus statement: Guidelines for Management of Small Pulmonary Nodules Detected on CT Images: From the Fleischner Society 2017; Radiology 2017; 284:228-243. No evidence of acute cardiopulmonary disease. Aortic Atherosclerosis (ICD10-I70.0). Electronically Signed   By: Julian Hy M.D.   On: 10/03/2018 22:17   US Renal  Result Date: 10/03/2018 CLINICAL DATA:  Renal failure.  Chronicity not specified. EXAM: RENAL / URINARY TRACT ULTRASOUND COMPLETE COMPARISON:  CT 04/20/2018 FINDINGS: Right Kidney: Renal measurements: 12.4 x 5.1 x 5.7 cm = volume: 188.7 mL. Echogenicity within normal limits. Two small cyst in the right kidney measuring 1.2 and 1.3 cm respectively. No solid mass or hydronephrosis visualized. Left Kidney: Renal measurements: 11.8 x 5.8 x 6.4 cm = volume: 227 mL. Echogenicity within normal limits. No mass or hydronephrosis visualized. Bladder: Appears normal for degree of bladder distention. IMPRESSION: Unremarkable renal ultrasound.  No obstructive uropathy. Incidental small right renal cysts. Electronically Signed   By: Keith Rake M.D.   On: 10/03/2018 21:23        Scheduled Meds: . aspirin EC  81 mg Oral QPM  . clopidogrel  75 mg Oral Daily  . enoxaparin (LOVENOX) injection  30 mg Subcutaneous Q24H  . feeding supplement  1 Container Oral TID BM  . insulin aspart  0-5 Units Subcutaneous QHS  . insulin aspart  0-9 Units Subcutaneous TID WC  . insulin glargine  20 Units Subcutaneous QHS  . levothyroxine  50 mcg Oral Q0600  . multivitamin  with minerals  1 tablet Oral Daily  . pantoprazole  40 mg Oral Daily  . predniSONE  10 mg Oral Q breakfast  . rosuvastatin  10 mg Oral QHS   Continuous Infusions: . sodium chloride       LOS: 0 days     Vernell Leep, MD, FACP, [facility]. [facility]  To contact the attending provider between 7A-7P or the covering provider during after hours 7P-7A, please log into the web site [email or web address] and access using universal [place] password for that web site. If you do not have the password, please call the hospital operator.  10/04/2018, 3:03 PM

## 2018-10-04 NOTE — Progress Notes (Signed)
OT Cancellation Note  Patient Details Name: JEVAN GAUNT MRN: [id number] DOB: [birth year]   Cancelled Treatment:    Reason Eval/Treat Not Completed: OT screened, no needs identified, will sign off.  Merri Ray Shalanda Brogden 10/04/2018, 9:22 AM   Hulda Humphrey OTR/L Acute Rehabilitation Services Pager: [phone] Office: [phone]

## 2018-10-04 NOTE — Progress Notes (Signed)
Inpatient Diabetes Program Recommendations  AACE/ADA: New Consensus Statement on Inpatient Glycemic Control (2015)  Target Ranges:  Prepandial:   less than 140 mg/dL      Peak postprandial:   less than 180 mg/dL (1-2 hours)      Critically ill patients:  140 - 180 mg/dL   Lab Results  Component Value Date   GLUCAP 213 (H) 10/04/2018   HGBA1C 10.2 (H) 10/04/2018    Review of Glycemic Control  Diabetes history: type 2 Outpatient Diabetes medications: Novolin 70/30 insulin 30-40 units 2-3 times per day if CBG is >200 mg/dl., takes Glucotrol Current orders for Inpatient glycemic control: Lantus 20 units every HS, Novolog SENSITIVE correction scale every 4 hours.  Inpatient Diabetes Program Recommendations:   CBGs elevated today 248-213 mg/dl.  If blood sugars continue to be greater than 180 mg/dl, recommend changing Novolog correction scale to MODERATE TID & HS scale.   Did speak with patient on the phone to verify home insulin dosages. States that his HgbA1C is is elevated due to the multiple doses of steroids that he has been taking over the last few years. He recently has had steroid injections in leg and drops in the eye.   Will continue to monitor blood sugars while in the hospital.   Harvel Ricks RN BSN CDE Diabetes Coordinator Pager: [phone]  8am-5pm

## 2018-10-04 NOTE — Progress Notes (Signed)
Admit pt to room, v/s stable, alert and oriented time 4, refused any pain, orieted to unit and equipment , disscussed with plan of care, verbalized understanding.pt stated use c pap at night at home but refused to have on tonight, will continue monitor, rest in bed.

## 2018-10-04 NOTE — Plan of Care (Signed)

## 2018-10-04 NOTE — Progress Notes (Signed)
Pt had wide QRS 24 beats, on call provider notified

## 2018-10-05 ENCOUNTER — Inpatient Hospital Stay (HOSPITAL_COMMUNITY): Payer: Medicare HMO

## 2018-10-05 DIAGNOSIS — M7989 Other specified soft tissue disorders: Secondary | ICD-10-CM

## 2018-10-05 DIAGNOSIS — R52 Pain, unspecified: Secondary | ICD-10-CM

## 2018-10-05 LAB — BASIC METABOLIC PANEL
Anion gap: 7 (ref 5–15)
BUN: 38 mg/dL — ABNORMAL HIGH (ref 8–23)
CO2: 26 mmol/L (ref 22–32)
Calcium: 8.4 mg/dL — ABNORMAL LOW (ref 8.9–10.3)
Chloride: 104 mmol/L (ref 98–111)
Creatinine, Ser: 2.96 mg/dL — ABNORMAL HIGH (ref 0.61–1.24)
GFR calc Af Amer: 23 mL/min — ABNORMAL LOW (ref >60)
GFR calc non Af Amer: 20 mL/min — ABNORMAL LOW (ref >60)
Glucose, Bld: 202 mg/dL — ABNORMAL HIGH (ref 70–99)
Potassium: 4.3 mmol/L (ref 3.5–5.1)
Sodium: 137 mmol/L (ref 135–145)

## 2018-10-05 LAB — CBC
HCT: 29.5 % — ABNORMAL LOW (ref 39.0–52.0)
Hemoglobin: 9.5 g/dL — ABNORMAL LOW (ref 13.0–17.0)
MCH: 31 pg (ref 26.0–34.0)
MCHC: 32.2 g/dL (ref 30.0–36.0)
MCV: 96.4 fL (ref 80.0–100.0)
Platelets: 240 10*3/uL (ref 150–400)
RBC: 3.06 MIL/uL — ABNORMAL LOW (ref 4.22–5.81)
RDW: 13.1 % (ref 11.5–15.5)
WBC: 11.4 10*3/uL — ABNORMAL HIGH (ref 4.0–10.5)
nRBC: 0 % (ref 0.0–0.2)

## 2018-10-05 LAB — T3: T3, Total: 60 ng/dL — ABNORMAL LOW (ref 71–180)

## 2018-10-05 LAB — GLUCOSE, CAPILLARY
Glucose-Capillary: 184 mg/dL — ABNORMAL HIGH (ref 70–99)
Glucose-Capillary: 243 mg/dL — ABNORMAL HIGH (ref 70–99)
Glucose-Capillary: 319 mg/dL — ABNORMAL HIGH (ref 70–99)
Glucose-Capillary: 294 mg/dL — ABNORMAL HIGH (ref 70–99)

## 2018-10-05 LAB — ANGIOTENSIN CONVERTING ENZYME: Angiotensin-Converting Enzyme: 82 U/L (ref 14–82)

## 2018-10-05 MED ORDER — SODIUM CHLORIDE 0.9 % IV SOLN
INTRAVENOUS | Status: DC
  Administered 2018-10-05 – 2018-10-06 (×3): via INTRAVENOUS

## 2018-10-05 MED ORDER — INSULIN ASPART 100 UNIT/ML ~~LOC~~ SOLN
4.0000 [IU] | Freq: Three times a day (TID) | SUBCUTANEOUS | Status: DC
  Administered 2018-10-05: 4 [IU] via SUBCUTANEOUS

## 2018-10-05 NOTE — Progress Notes (Signed)
Pt refuses CPAP for tonight.   

## 2018-10-05 NOTE — Progress Notes (Signed)
Nutrition Brief Note  RD consulted to assess oral nutrition supplement regimen. Pt refusing Boost Breeze and Diabetes Coordinator has d/c this order.  PO intake 75-100%. Pt does not like milk-like supplements like Ensure Enlive. Will monitor PO intake and reevaluate if PO intake decreases.   Gaynell Face, MS, RD, LDN Inpatient Clinical Dietitian Pager: [phone] Weekend/After Hours: [phone]

## 2018-10-05 NOTE — Progress Notes (Signed)
Progress Note  Patient Name: [name] Date of Encounter: 10/05/2018  Primary Cardiologist: Candee Furbish, MD    Subjective    73 year old with history of coronary artery disease-status post stenting in July, 2020.  He has a history of chronic diastolic congestive heart failure, type 2 diabetes mellitus, hypertension, hypothyroidism, chronic kidney disease.  He has obstructive sleep apnea.  He was admitted with weakness and shortness of breath and was found to be in acute renal failure. He is received hydration overnight.  His creatinine is only minimally improved.  He is not complaining of any worsening shortness of breath.  Inpatient Medications    Scheduled Meds:  aspirin EC  81 mg Oral QPM   clopidogrel  75 mg Oral Daily   enoxaparin (LOVENOX) injection  30 mg Subcutaneous Q24H   feeding supplement  1 Container Oral TID BM   insulin aspart  0-5 Units Subcutaneous QHS   insulin aspart  0-9 Units Subcutaneous TID WC   insulin glargine  20 Units Subcutaneous QHS   levothyroxine  50 mcg Oral Q0600   multivitamin with minerals  1 tablet Oral Daily   pantoprazole  40 mg Oral Daily   predniSONE  10 mg Oral Q breakfast   rosuvastatin  10 mg Oral QHS   Continuous Infusions:  PRN Meds: acetaminophen **OR** acetaminophen, albuterol, HYDROcodone-acetaminophen, montelukast, ondansetron **OR** ondansetron (ZOFRAN) IV   Vital Signs    Vitals:   10/04/18 2000 10/05/18 0205 10/05/18 0538 10/05/18 0800  BP: 125/69  (!) 102/44 123/63  Pulse: (!) 58  (!) 55 (!) 58  Resp: 18  18 18   Temp: 98.4 F (36.9 C)  98.6 F (37 C)   TempSrc: Oral  Oral   SpO2: 98%  95%   Weight:  104.8 kg    Height:        Intake/Output Summary (Last 24 hours) at 10/05/2018 0906 Last data filed at 10/05/2018 5993 Gross per 24 hour  Intake 600 ml  Output 1460 ml  Net -860 ml   Last 3 Weights 10/05/2018 10/04/2018 10/03/2018  Weight (lbs) 231 lb 228 lb 11.2 oz 229 lb  Weight (kg) 104.781 kg  103.738 kg 103.874 kg      Telemetry     NSR - Personally Reviewed  ECG     NSR  - Personally Reviewed  Physical Exam   GEN:  middle age male ,  NAD  Neck: No JVD Cardiac:  RR  Respiratory: Clear to auscultation bilaterally. GI: Soft, nontender, non-distended  MS: No edema; No deformity. Neuro:  Nonfocal  Psych: Normal affect   Labs    High Sensitivity Troponin:   Recent Labs  Lab 10/03/18 1430 10/03/18 1628  TROPONINIHS 13 15      Cardiac EnzymesNo results for input(s): TROPONINI in the last 168 hours. No results for input(s): TROPIPOC in the last 168 hours.   Chemistry Recent Labs  Lab 10/03/18 1430 10/03/18 1923 10/04/18 0643 10/05/18 0557  NA 138  --  137 137  K 3.4*  --  3.0* 4.3  CL 99  --  101 104  CO2 21*  --  26 26  GLUCOSE 241*  --  144* 202*  BUN 41*  --  40* 38*  CREATININE 3.24*  --  3.05* 2.96*  CALCIUM 8.8*  --  8.5* 8.4*  PROT  --  7.0 6.1*  --   ALBUMIN  --  3.2* 2.9*  --   AST  --  14*  13*  --   ALT  --  16 14  --   ALKPHOS  --  52 46  --   BILITOT  --  0.9 0.6  --   GFRNONAA 18*  --  19* 20*  GFRAA 21*  --  23* 23*  ANIONGAP 18*  --  10 7     Hematology Recent Labs  Lab 10/03/18 1923 10/04/18 0643 10/05/18 0557  WBC 13.6* 12.9* 11.4*  RBC 3.57* 3.16*   3.16* 3.06*  HGB 11.2* 9.9* 9.5*  HCT 34.7* 30.4* 29.5*  MCV 97.2 96.2 96.4  MCH 31.4 31.3 31.0  MCHC 32.3 32.6 32.2  RDW 13.2 13.1 13.1  PLT 278 259 240    BNP Recent Labs  Lab 10/03/18 1643  BNP 42.4     DDimer No results for input(s): DDIMER in the last 168 hours.   Radiology    Dg Chest 2 View  Result Date: 10/03/2018 CLINICAL DATA:  Chest pain for 4 weeks, coronary stent put in 4 weeks ago, diabetes mellitus, coronary artery disease post MI, hypertension, sarcoid EXAM: CHEST - 2 VIEW COMPARISON:  06/15/2016, 06/25/2015 Correlation: CT chest 02/20/2014 FINDINGS: Enlargement of cardiac silhouette. Blunting of the RIGHT costophrenic angle by large fat pad on  prior CT. Mediastinal contours and pulmonary vascularity otherwise normal. Chronic interstitial prominence in the lower lungs bilaterally, greater on RIGHT, question related to chronic interstitial lung disease from sarcoidosis, unchanged since 06/25/2015. No definite acute infiltrate, pleural effusion or pneumothorax. Osseous structures unremarkable. IMPRESSION: Chronic basilar interstitial disease greater on RIGHT. Mild enlargement of cardiac silhouette with prominent RIGHT epicardial fat pad. No acute abnormalities. Electronically Signed   By: Lavonia Dana M.D.   On: 10/03/2018 15:14   Ct Chest Wo Contrast  Result Date: 10/03/2018 CLINICAL DATA:  Sarcoidosis EXAM: CT CHEST WITHOUT CONTRAST TECHNIQUE: Multidetector CT imaging of the chest was performed following the standard protocol without IV contrast. COMPARISON:  Chest radiographs dated 10/03/2018. CT chest dated 02/20/2014. FINDINGS: Cardiovascular: The heart is normal in size. No pericardial effusion. No evidence of thoracic aortic aneurysm. Mild atherosclerotic calcifications of the aortic arch. Coronary atherosclerosis with LAD stent. Mediastinum/Nodes: No suspicious mediastinal lymphadenopathy. Visualized thyroid is unremarkable. Lungs/Pleura: 7 mm subpleural nodule in the medial right lower lobe (series 8/image 37), unchanged from 2015, benign. Additional triangular subpleural nodule in the right lower lobe (series 8/image 96), unchanged, benign. Additional subpleural nodule in the anterior right upper lobe (series 8/image 72), unchanged, benign. Scarring in the right middle lobe, chronic, possibly related to the patient's known sarcoidosis. No focal consolidation. No pleural effusion or pneumothorax. Upper Abdomen: Visualized upper abdomen is notable for hepatic steatosis and prior cholecystectomy. Musculoskeletal: Visualized osseous structures are within normal limits. IMPRESSION: Right middle lobe scarring, possibly related to the patient's known  sarcoidosis. Stable small right lung nodules, unchanged from 2015, benign. Dedicated follow-up imaging is not required per Fleischner Society guidelines. This recommendation follows the consensus statement: Guidelines for Management of Small Pulmonary Nodules Detected on CT Images: From the Fleischner Society 2017; Radiology 2017; 284:228-243. No evidence of acute cardiopulmonary disease. Aortic Atherosclerosis (ICD10-I70.0). Electronically Signed   By: Julian Hy M.D.   On: 10/03/2018 22:17   US Renal  Result Date: 10/03/2018 CLINICAL DATA:  Renal failure.  Chronicity not specified. EXAM: RENAL / URINARY TRACT ULTRASOUND COMPLETE COMPARISON:  CT 04/20/2018 FINDINGS: Right Kidney: Renal measurements: 12.4 x 5.1 x 5.7 cm = volume: 188.7 mL. Echogenicity within normal limits. Two small cyst in the  right kidney measuring 1.2 and 1.3 cm respectively. No solid mass or hydronephrosis visualized. Left Kidney: Renal measurements: 11.8 x 5.8 x 6.4 cm = volume: 227 mL. Echogenicity within normal limits. No mass or hydronephrosis visualized. Bladder: Appears normal for degree of bladder distention. IMPRESSION: Unremarkable renal ultrasound.  No obstructive uropathy. Incidental small right renal cysts. Electronically Signed   By: Keith Rake M.D.   On: 10/03/2018 21:23    Cardiac Studies     Patient Profile     73 y.o. male admitted with acute renal insufficiency   Assessment & Plan    1.  Chronic diastolic CHF:   Presented with fatigue and acute renal insufficiency.    No signs of symptoms of CHF Agree with holding lasix and gentle hydration for now.  No additional cardiac recs.       For questions or updates, please contact [place] Please consult [email or web address] for contact info under        Signed, Mertie Moores, MD  10/05/2018, 9:06 AM

## 2018-10-05 NOTE — Progress Notes (Signed)
Inpatient Diabetes Program Recommendations  AACE/ADA: New Consensus Statement on Inpatient Glycemic Control (2015)  Target Ranges:  Prepandial:   less than 140 mg/dL      Peak postprandial:   less than 180 mg/dL (1-2 hours)      Critically ill patients:  140 - 180 mg/dL   Lab Results  Component Value Date   GLUCAP 243 (H) 10/05/2018   HGBA1C 10.2 (H) 10/04/2018    Review of Glycemic Control Results for ROALD, LUKACS (MRN [id number]) as of 10/05/2018 12:23  Ref. Range 10/04/2018 11:53 10/04/2018 16:04 10/04/2018 20:58 10/05/2018 06:25 10/05/2018 12:00  Glucose-Capillary Latest Ref Range: 70 - 99 mg/dL 213 (H) 348 (H) 231 (H) 184 (H) 243 (H)   Diabetes history: Type 2 DM Outpatient Diabetes medications: Novolin 70/30 insulin 30-40 units 2-3 times per day if CBG is >200 mg/dl., takes Glucotrol Current orders for Inpatient glycemic control: Lantus 20 units every HS, Novolog SENSITIVE correction scale every 4 hours.  Inpatient Diabetes Program Recommendations:    Consider adding Novolog 4 units TID (Assuming patient is consuming >50% of meal). Also of note, patient has [employer] ordered. This supplement contains 54 g of carbohydrates, thus contributing to increases in blood sugars following consumption. Appears that patient has refused several, however curious if this needs further adjustment.   Thanks, Bronson Curb, MSN, RNC-OB Diabetes Coordinator [phone] (8a-5p)

## 2018-10-05 NOTE — Progress Notes (Signed)
PROGRESS NOTE   [name]  LKG:[id number]    DOB: 1945-12-07    DOA: 10/03/2018  PCP: Shirline Frees, MD   I have briefly reviewed patients previous medical records in [facility].  Chief Complaint  Patient presents with   Chest Pain    Brief Narrative:  73 year old married male with PMH of CAD s/p stents x2 early July 6644, chronic diastolic CHF, DM 2, hypothyroid, HTN, HLD, CKD 3, OSA on CPAP, sarcoidosis and adrenal insufficiency on chronic prednisone, chronic leg edema, did relatively well for a week post stenting and then noted progressive dyspnea on exertion, fatigue, chest discomfort, worsening leg edema, nausea and right upper extremity pain.  Admitted for acute on chronic kidney disease.  Cardiology consulted.   Assessment & Plan:   Active Problems:   Sarcoidosis   Diabetes mellitus without complication ([place])   Coronary artery disease   GERD   Sleep apnea   Coronary atherosclerosis of native coronary artery   Chronic diastolic heart failure (HCC)   Pulmonary sarcoidosis (HCC)   Uncontrolled diabetes mellitus (HCC)   Hyperlipidemia   CKD (chronic kidney disease), stage III (HCC)   Iron deficiency anemia   AKI (acute kidney injury) (HCC)   Hypoalbuminemia   Hypokalemia   Hypothyroidism   Acute kidney injury superimposed on chronic kidney disease ([place])   Acute kidney injury complicating stage III chronic kidney disease  Baseline creatinine probably in the 1.4-1.5 range.  Creatinine on 6/2: 1.41.  Admitted with creatinine of 3.24.  Acute kidney injury likely due to dehydration from poor oral intake, ongoing high-dose diuretic use and ARB.  Held Lasix and ARB.  Renal ultrasound without obstructive uropathy.  Gently hydrated overnight and creatinine has only slightly improved to 2.96.  Suspect remains volume depleted.  Increased IV fluids 200 mL/h for the next 24 hours.  His symptoms of generalized weakness and nausea may be related to this and are much  better.  Follow BMP in a.m. and if no significant improvement in creatinine, consider nephrology consultation.  Dyspnea on exertion  Unclear etiology.  Clinically dehydrated and not in florid CHF.  CT chest without contrast shows no acute cardiopulmonary disease.  May be related to some uremic symptoms from acute kidney injury, obesity/OSA and generalized deconditioning.  Patient states that his dyspnea has significantly improved even with activity such as walking around him his hospital room.  Uncontrolled DM2 with hyperglycemia and renal complications  I3K >74  Likely complicated by chronic prednisone use.  Continue Lantus 20 units nightly and changed SSI to sensitive with bedtime scale.  DM coordinator input appreciated.  Added NovoLog mealtime 4 units.  Discontinued boost and?  Glucerna.  Hyperlipidemia  Continue statins.  CAD s/p DES x2  Cardiology input appreciated.  Discussed with Dr. Acie Fredrickson today.  No typical angina symptoms and clinical picture not consistent with ACS.  Continue aspirin, Plavix, Crestor  States that he has been having right upper extremity pain since cardiac cath but no acute findings on exam  Chronic diastolic CHF  TTE 2/59/5638: LVEF 60-65%.  Currently clinically dehydrated.  Lasix on hold.  Anemia in CKD  Stable.  No bleeding reported.  May have element of iron deficiency anemia as well.  Hypokalemia/hypomagnesemia Replaced.  Obesity Estimated body mass index is 33.15 kg/m as calculated from the following:   Height as of this encounter: 5\' 10"  (1.778 m).   Weight as of this encounter: 104.8 kg.   Nutritional status Nutrition Problem: Inadequate oral intake  Etiology: nausea Signs/Symptoms: meal completion < 50%, energy intake < 75% for > 7 days, per patient/family report Interventions: Boost Breeze, MVI   Sarcoidosis  CT chest without contrast without acute cardiopulmonary findings.  Continue chronic prednisone 10 mg  daily.  GERD Continue PPI  OSA on CPAP Continue CPAP.  Hypothyroid Continue Synthroid.  Right upper extremity swelling  Reports since cardiac cath.  RUE venous Doppler without DVT.  No clinical features suggestive of cellulitis or other acute findings.  Elevate limb and monitor.   DVT prophylaxis: Lovenox Code Status: Full Family Communication: Discussed in detail with patient spouse at bedside today, updated care and answered questions. Disposition: DC home pending clinical improvement, may be the next 1 to 2 days.   Consultants:  Cardiology  Procedures:  None  Antimicrobials:  None   Subjective: Overall feels much better.  Was actually hopeful of going home today.  Nausea resolved.  Dyspnea significantly improved and breathing probably close to baseline.  States that he ambulated several times in the room without discomfort.  No leg swelling.  Ongoing right upper extremity swelling and intermittent pain.  Objective:  Vitals:   10/05/18 0205 10/05/18 0538 10/05/18 0800 10/05/18 1307  BP:  (!) 102/44 123/63 137/63  Pulse:  (!) 55 (!) 58 71  Resp:  18 18 20   Temp:  98.6 F (37 C)  98.3 F (36.8 C)  TempSrc:  Oral  Oral  SpO2:  95%  96%  Weight: 104.8 kg     Height:        Examination:  General exam: Pleasant elderly male, moderately built and obese sitting up comfortably at edge of bed.  Oral mucosa with borderline hydration. Respiratory system: Clear to auscultation.  No increased work of breathing. Cardiovascular system: S1 and S2 heard, RRR.  No JVD, murmurs.  Trace bilateral ankle edema.  Telemetry personally reviewed: SB in the 50s-SR with BBB morphology. Gastrointestinal system: Abdomen is nondistended, soft and nontender. No organomegaly or masses felt. Normal bowel sounds heard. Central nervous system: Alert and oriented. No focal neurological deficits. Extremities: Symmetric 5 x 5 power.  Right upper extremity with minimal diffuse swelling  compared to left, no redness, increased warmth or tenderness.  Good radial pulses felt. Skin: No rashes, lesions or ulcers Psychiatry: Judgement and insight appear normal. Mood & affect appropriate.     Data Reviewed: I have personally reviewed following labs and imaging studies  CBC: Recent Labs  Lab 10/03/18 1430 10/03/18 1923 10/04/18 0643 10/05/18 0557  WBC 14.5* 13.6* 12.9* 11.4*  NEUTROABS  --  10.4*  --   --   HGB 11.6* 11.2* 9.9* 9.5*  HCT 36.4* 34.7* 30.4* 29.5*  MCV 97.3 97.2 96.2 96.4  PLT 339 278 259 431   Basic Metabolic Panel: Recent Labs  Lab 10/03/18 1430 10/03/18 1923 10/04/18 0643 10/05/18 0557  NA 138  --  137 137  K 3.4*  --  3.0* 4.3  CL 99  --  101 104  CO2 21*  --  26 26  GLUCOSE 241*  --  144* 202*  BUN 41*  --  40* 38*  CREATININE 3.24*  --  3.05* 2.96*  CALCIUM 8.8*  --  8.5* 8.4*  MG  --  1.8 1.6*  --   PHOS  --  4.8* 3.8  --    Liver Function Tests: Recent Labs  Lab 10/03/18 1923 10/04/18 0643  AST 14* 13*  ALT 16 14  ALKPHOS 52 46  BILITOT  0.9 0.6  PROT 7.0 6.1*  ALBUMIN 3.2* 2.9*    Cardiac Enzymes: No results for input(s): CKTOTAL, CKMB, CKMBINDEX, TROPONINI in the last 168 hours.  CBG: Recent Labs  Lab 10/04/18 1153 10/04/18 1604 10/04/18 2058 10/05/18 0625 10/05/18 1200  GLUCAP 213* 348* 231* 184* 243*    Recent Results (from the past 240 hour(s))  SARS Coronavirus 2 (CEPHEID - Performed in [place] hospital lab), Hosp Order     Status: None   Collection Time: 10/03/18  4:50 PM   Specimen: Urine, Clean Catch; Nasopharyngeal  Result Value Ref Range Status   SARS Coronavirus 2 NEGATIVE NEGATIVE Final    Comment: (NOTE) If result is NEGATIVE SARS-CoV-2 target nucleic acids are NOT DETECTED. The SARS-CoV-2 RNA is generally detectable in upper and lower  respiratory specimens during the acute phase of infection. The lowest  concentration of SARS-CoV-2 viral copies this assay can detect is 250  copies / mL. A  negative result does not preclude SARS-CoV-2 infection  and should not be used as the sole basis for treatment or other  patient management decisions.  A negative result may occur with  improper specimen collection / handling, submission of specimen other  than nasopharyngeal swab, presence of viral mutation(s) within the  areas targeted by this assay, and inadequate number of viral copies  (<250 copies / mL). A negative result must be combined with clinical  observations, patient history, and epidemiological information. If result is POSITIVE SARS-CoV-2 target nucleic acids are DETECTED. The SARS-CoV-2 RNA is generally detectable in upper and lower  respiratory specimens dur ing the acute phase of infection.  Positive  results are indicative of active infection with SARS-CoV-2.  Clinical  correlation with patient history and other diagnostic information is  necessary to determine patient infection status.  Positive results do  not rule out bacterial infection or co-infection with other viruses. If result is PRESUMPTIVE POSTIVE SARS-CoV-2 nucleic acids MAY BE PRESENT.   A presumptive positive result was obtained on the submitted specimen  and confirmed on repeat testing.  While 2019 novel coronavirus  (SARS-CoV-2) nucleic acids may be present in the submitted sample  additional confirmatory testing may be necessary for epidemiological  and / or clinical management purposes  to differentiate between  SARS-CoV-2 and other Sarbecovirus currently known to infect humans.  If clinically indicated additional testing with an alternate test  methodology [phone]) is advised. The SARS-CoV-2 RNA is generally  detectable in upper and lower respiratory sp ecimens during the acute  phase of infection. The expected result is Negative. Fact Sheet for Patients:  StrictlyIdeas.no Fact Sheet for Healthcare Providers: BankingDealers.co.za This test is not  yet approved or cleared by the [place] FDA and has been authorized for detection and/or diagnosis of SARS-CoV-2 by FDA under an Emergency Use Authorization (EUA).  This EUA will remain in effect (meaning this test can be used) for the duration of the COVID-19 declaration under Section 564(b)(1) of the Act, 21 U.S.C. section 360bbb-3(b)(1), unless the authorization is terminated or revoked sooner. Performed at [facility], [place] [address], [place], [place] [address]          Radiology Studies: Dg Chest 2 View  Result Date: 10/03/2018 CLINICAL DATA:  Chest pain for 4 weeks, coronary stent put in 4 weeks ago, diabetes mellitus, coronary artery disease post MI, hypertension, sarcoid EXAM: CHEST - 2 VIEW COMPARISON:  06/15/2016, 06/25/2015 Correlation: CT chest 02/20/2014 FINDINGS: Enlargement of cardiac silhouette. Blunting of the RIGHT  costophrenic angle by large fat pad on prior CT. Mediastinal contours and pulmonary vascularity otherwise normal. Chronic interstitial prominence in the lower lungs bilaterally, greater on RIGHT, question related to chronic interstitial lung disease from sarcoidosis, unchanged since 06/25/2015. No definite acute infiltrate, pleural effusion or pneumothorax. Osseous structures unremarkable. IMPRESSION: Chronic basilar interstitial disease greater on RIGHT. Mild enlargement of cardiac silhouette with prominent RIGHT epicardial fat pad. No acute abnormalities. Electronically Signed   By: Lavonia Dana M.D.   On: 10/03/2018 15:14   Ct Chest Wo Contrast  Result Date: 10/03/2018 CLINICAL DATA:  Sarcoidosis EXAM: CT CHEST WITHOUT CONTRAST TECHNIQUE: Multidetector CT imaging of the chest was performed following the standard protocol without IV contrast. COMPARISON:  Chest radiographs dated 10/03/2018. CT chest dated 02/20/2014. FINDINGS: Cardiovascular: The heart is normal in size. No pericardial effusion. No evidence of thoracic aortic aneurysm. Mild  atherosclerotic calcifications of the aortic arch. Coronary atherosclerosis with LAD stent. Mediastinum/Nodes: No suspicious mediastinal lymphadenopathy. Visualized thyroid is unremarkable. Lungs/Pleura: 7 mm subpleural nodule in the medial right lower lobe (series 8/image 37), unchanged from 2015, benign. Additional triangular subpleural nodule in the right lower lobe (series 8/image 96), unchanged, benign. Additional subpleural nodule in the anterior right upper lobe (series 8/image 72), unchanged, benign. Scarring in the right middle lobe, chronic, possibly related to the patient's known sarcoidosis. No focal consolidation. No pleural effusion or pneumothorax. Upper Abdomen: Visualized upper abdomen is notable for hepatic steatosis and prior cholecystectomy. Musculoskeletal: Visualized osseous structures are within normal limits. IMPRESSION: Right middle lobe scarring, possibly related to the patient's known sarcoidosis. Stable small right lung nodules, unchanged from 2015, benign. Dedicated follow-up imaging is not required per Fleischner Society guidelines. This recommendation follows the consensus statement: Guidelines for Management of Small Pulmonary Nodules Detected on CT Images: From the Fleischner Society 2017; Radiology 2017; 284:228-243. No evidence of acute cardiopulmonary disease. Aortic Atherosclerosis (ICD10-I70.0). Electronically Signed   By: Julian Hy M.D.   On: 10/03/2018 22:17   US Renal  Result Date: 10/03/2018 CLINICAL DATA:  Renal failure.  Chronicity not specified. EXAM: RENAL / URINARY TRACT ULTRASOUND COMPLETE COMPARISON:  CT 04/20/2018 FINDINGS: Right Kidney: Renal measurements: 12.4 x 5.1 x 5.7 cm = volume: 188.7 mL. Echogenicity within normal limits. Two small cyst in the right kidney measuring 1.2 and 1.3 cm respectively. No solid mass or hydronephrosis visualized. Left Kidney: Renal measurements: 11.8 x 5.8 x 6.4 cm = volume: 227 mL. Echogenicity within normal limits. No  mass or hydronephrosis visualized. Bladder: Appears normal for degree of bladder distention. IMPRESSION: Unremarkable renal ultrasound.  No obstructive uropathy. Incidental small right renal cysts. Electronically Signed   By: Keith Rake M.D.   On: 10/03/2018 21:23   Vas [place] Upper Extremity Venous Duplex  Result Date: 10/05/2018 UPPER VENOUS STUDY  Indications: Swelling, and right radial artery catheterization 08/07/2018 Limitations: Body habitus and poor ultrasound/tissue interface. Performing Technologist: Maudry Mayhew MHA, RDMS, RVT, RDCS  Examination Guidelines: A complete evaluation includes B-mode imaging, spectral Doppler, color Doppler, and power Doppler as needed of all accessible portions of each vessel. Bilateral testing is considered an integral part of a complete examination. Limited examinations for reoccurring indications may be performed as noted.  Right Findings: +----------+------------+---------+-----------+----------+-------+  RIGHT      Compressible Phasicity Spontaneous Properties Summary  +----------+------------+---------+-----------+----------+-------+  IJV            Full        Yes        Yes                         +----------+------------+---------+-----------+----------+-------+  Subclavian     Full        Yes        Yes                         +----------+------------+---------+-----------+----------+-------+  Axillary       Full        Yes        Yes                         +----------+------------+---------+-----------+----------+-------+  Brachial       Full        Yes        Yes                         +----------+------------+---------+-----------+----------+-------+  Radial         Full                                               +----------+------------+---------+-----------+----------+-------+  Ulnar          Full                                               +----------+------------+---------+-----------+----------+-------+  Cephalic       Full                                                +----------+------------+---------+-----------+----------+-------+  Basilic        Full                                               +----------+------------+---------+-----------+----------+-------+  Left Findings: +----------+------------+---------+-----------+----------+-------+  LEFT       Compressible Phasicity Spontaneous Properties Summary  +----------+------------+---------+-----------+----------+-------+  Subclavian                 Yes        Yes                         +----------+------------+---------+-----------+----------+-------+  Summary:  Right: No evidence of deep vein thrombosis in the upper extremity. No evidence of superficial vein thrombosis in the upper extremity.  Left: No evidence of thrombosis in the subclavian.  *See table(s) above for measurements and observations.  Diagnosing physician: Curt Jews MD Electronically signed by Curt Jews MD on 10/05/2018 at 1:48:20 PM.    Final         Scheduled Meds:  aspirin EC  81 mg Oral QPM   clopidogrel  75 mg Oral Daily   enoxaparin (LOVENOX) injection  30 mg Subcutaneous Q24H   insulin aspart  0-5 Units Subcutaneous QHS   insulin aspart  0-9 Units Subcutaneous TID WC   insulin aspart  4 Units Subcutaneous TID WC   insulin glargine  20 Units Subcutaneous QHS   levothyroxine  50 mcg Oral Q0600   multivitamin with minerals  1  tablet Oral Daily   pantoprazole  40 mg Oral Daily   predniSONE  10 mg Oral Q breakfast   rosuvastatin  10 mg Oral QHS   Continuous Infusions:  sodium chloride 100 mL/hr at 10/05/18 1016     LOS: 1 day     Vernell Leep, MD, [place], [facility]. [facility]  To contact the attending provider between 7A-7P or the covering provider during after hours 7P-7A, please log into the web site [email or web address] and access using universal [place] password for that web site. If you do not have the password, please call the hospital operator.  10/05/2018, 1:55 PM

## 2018-10-05 NOTE — Progress Notes (Addendum)
Right upper extremity venous duplex completed. Preliminary results pending, will be under CV Proc in chart review.  10/05/2018 10:04 AM Maudry Mayhew, MHA, RVT, RDCS, RDMS

## 2018-10-06 ENCOUNTER — Inpatient Hospital Stay (HOSPITAL_COMMUNITY): Payer: Medicare HMO

## 2018-10-06 DIAGNOSIS — M79601 Pain in right arm: Secondary | ICD-10-CM

## 2018-10-06 LAB — CBC
HCT: 30.6 % — ABNORMAL LOW (ref 39.0–52.0)
Hemoglobin: 9.6 g/dL — ABNORMAL LOW (ref 13.0–17.0)
MCH: 31 pg (ref 26.0–34.0)
MCHC: 31.4 g/dL (ref 30.0–36.0)
MCV: 98.7 fL (ref 80.0–100.0)
Platelets: 237 10*3/uL (ref 150–400)
RBC: 3.1 MIL/uL — ABNORMAL LOW (ref 4.22–5.81)
RDW: 12.8 % (ref 11.5–15.5)
WBC: 11.3 10*3/uL — ABNORMAL HIGH (ref 4.0–10.5)
nRBC: 0 % (ref 0.0–0.2)

## 2018-10-06 LAB — URINALYSIS, ROUTINE W REFLEX MICROSCOPIC
Bacteria, UA: NONE SEEN
Bilirubin Urine: NEGATIVE
Glucose, UA: >=500 mg/dL — AB
Hgb urine dipstick: NEGATIVE
Ketones, ur: NEGATIVE mg/dL
Leukocytes,Ua: NEGATIVE
Nitrite: NEGATIVE
Protein, ur: NEGATIVE mg/dL
Specific Gravity, Urine: 1.01 (ref 1.005–1.030)
pH: 6 (ref 5.0–8.0)

## 2018-10-06 LAB — BASIC METABOLIC PANEL
Anion gap: 7 (ref 5–15)
BUN: 30 mg/dL — ABNORMAL HIGH (ref 8–23)
CO2: 26 mmol/L (ref 22–32)
Calcium: 8.2 mg/dL — ABNORMAL LOW (ref 8.9–10.3)
Chloride: 106 mmol/L (ref 98–111)
Creatinine, Ser: 2.51 mg/dL — ABNORMAL HIGH (ref 0.61–1.24)
GFR calc Af Amer: 29 mL/min — ABNORMAL LOW (ref >60)
GFR calc non Af Amer: 25 mL/min — ABNORMAL LOW (ref >60)
Glucose, Bld: 213 mg/dL — ABNORMAL HIGH (ref 70–99)
Potassium: 3.8 mmol/L (ref 3.5–5.1)
Sodium: 139 mmol/L (ref 135–145)

## 2018-10-06 LAB — PROTEIN / CREATININE RATIO, URINE
Creatinine, Urine: 61.14 mg/dL
Protein Creatinine Ratio: 0.54 mg/mg{Cre} — ABNORMAL HIGH (ref 0.00–0.15)
Total Protein, Urine: 33 mg/dL

## 2018-10-06 LAB — MAGNESIUM: Magnesium: 1.7 mg/dL (ref 1.7–2.4)

## 2018-10-06 LAB — GLUCOSE, CAPILLARY
Glucose-Capillary: 193 mg/dL — ABNORMAL HIGH (ref 70–99)
Glucose-Capillary: 212 mg/dL — ABNORMAL HIGH (ref 70–99)
Glucose-Capillary: 246 mg/dL — ABNORMAL HIGH (ref 70–99)
Glucose-Capillary: 219 mg/dL — ABNORMAL HIGH (ref 70–99)

## 2018-10-06 LAB — CREATININE, URINE, RANDOM: Creatinine, Urine: 61.63 mg/dL

## 2018-10-06 LAB — SODIUM, URINE, RANDOM: Sodium, Ur: 84 mmol/L

## 2018-10-06 IMAGING — US US RENAL
1 series · 14 of 25 positions shown · non-contrast
Comparison: CT abdomen and pelvis [DATE]. Renal ultrasound
[DATE].

CLINICAL DATA: Acute kidney injury.

EXAM:
RENAL / URINARY TRACT ULTRASOUND COMPLETE

[Series 1: us renal · 14 of 33 slices shown]
[im 1/33]
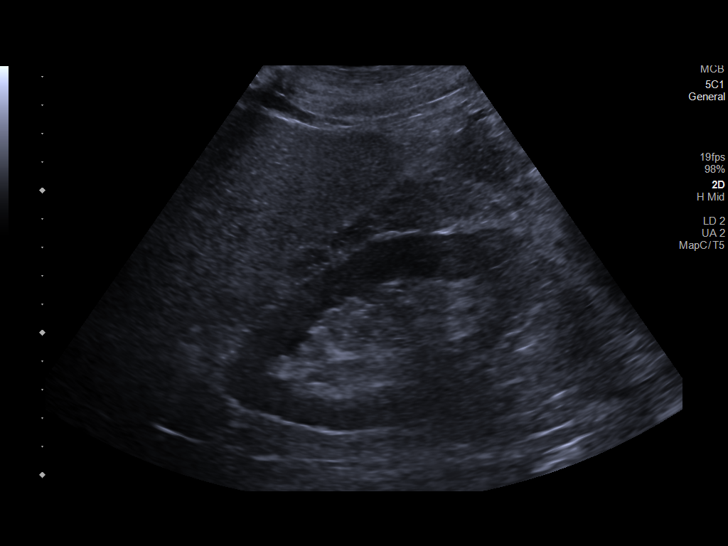
[im 3/33]
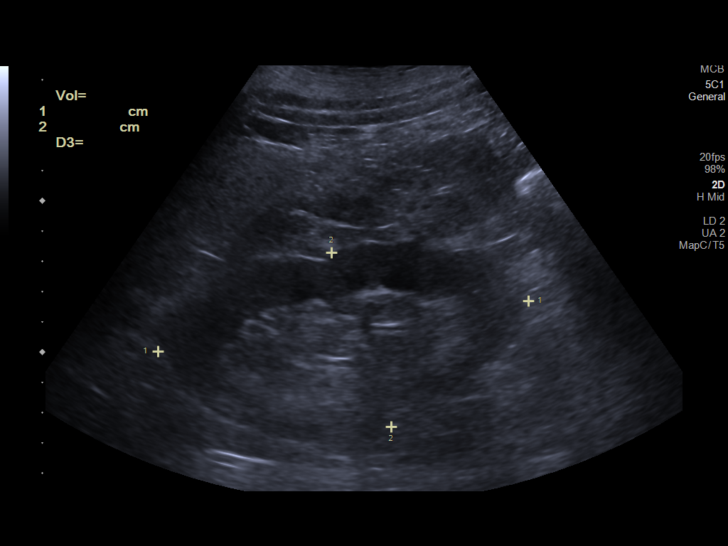
[im 6/33]
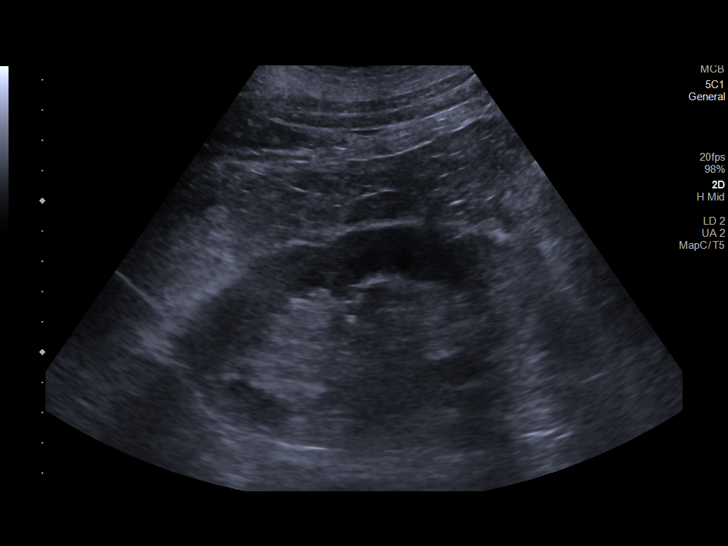
[im 9/33]
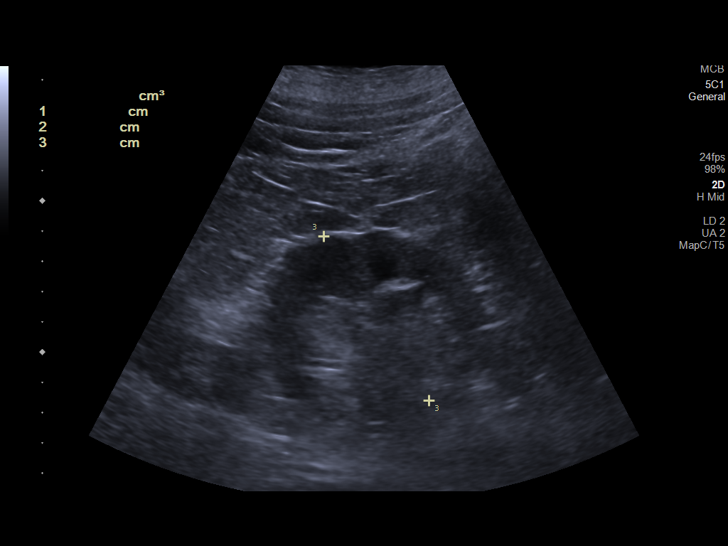
[im 11/33]
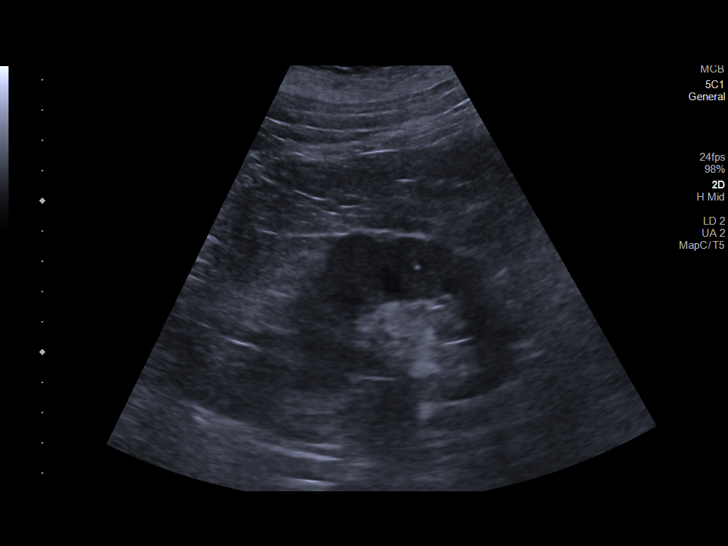
[im 13/33]
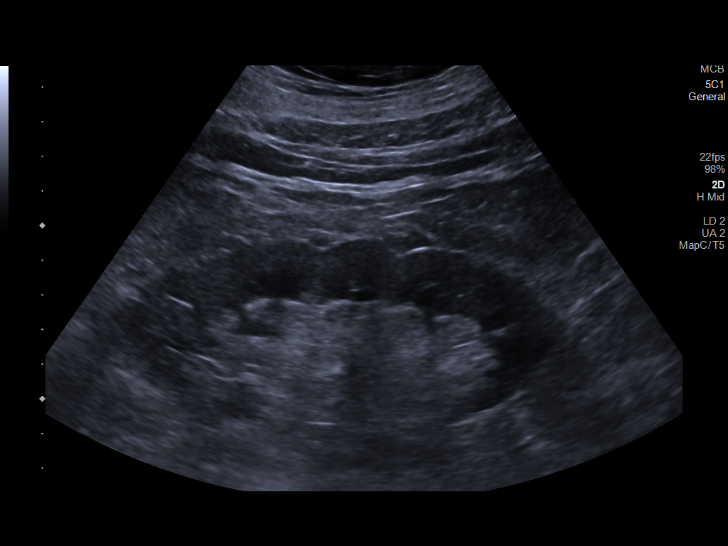
[im 15/33]
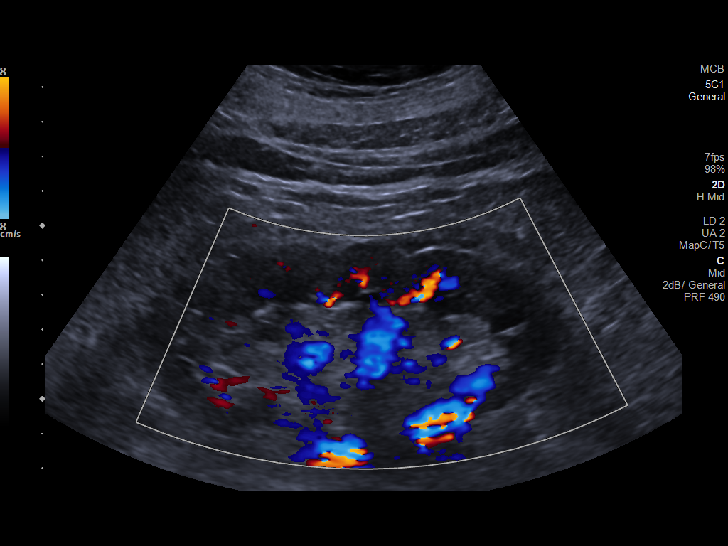
[im 18/33]
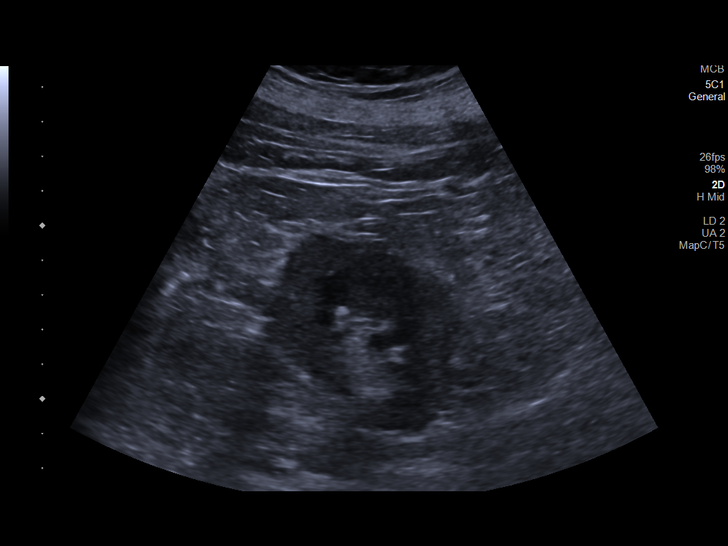
[im 21/33]
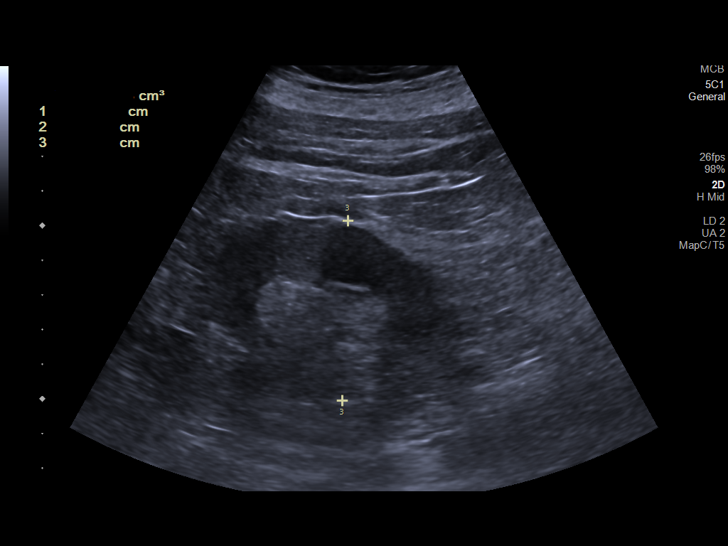
[im 22/33]
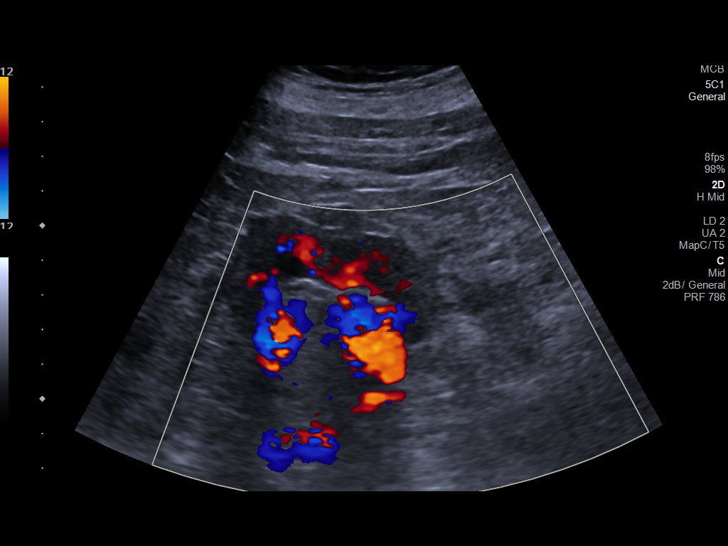
[im 25/33]
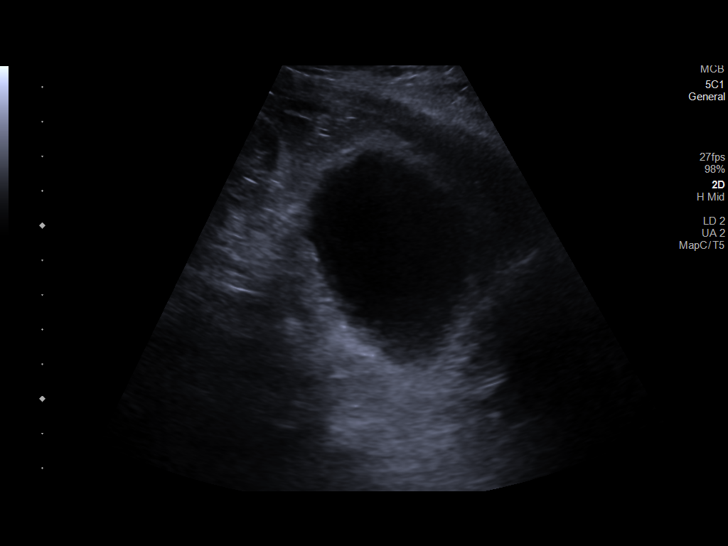
[im 27/33]
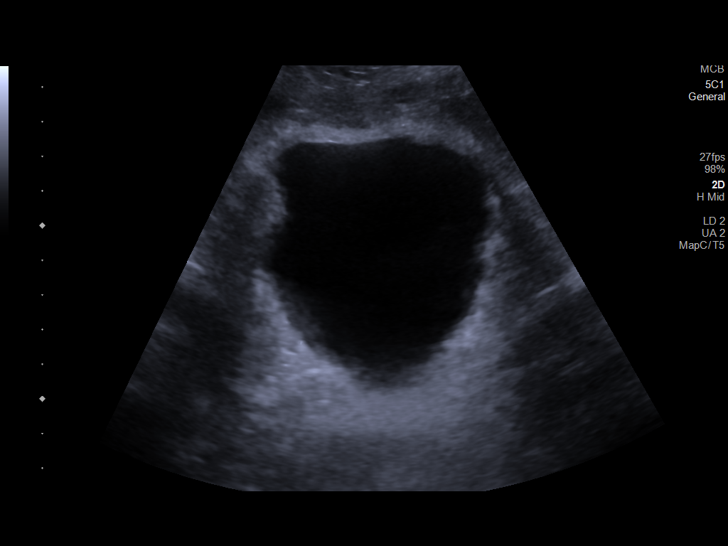
[im 30/33]
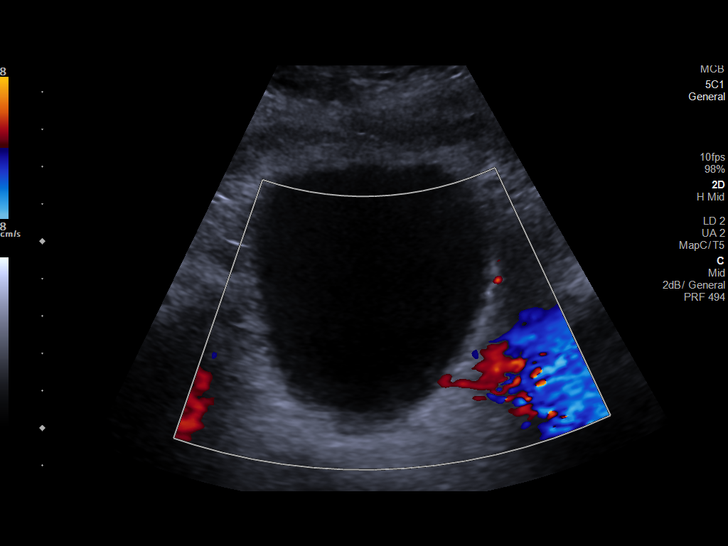
[im 33/33]
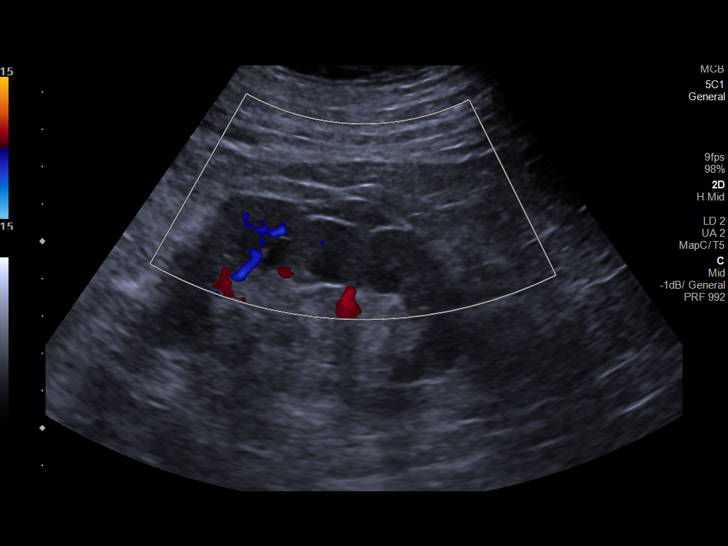

[14 of 25 positions shown; findings below may reference images not displayed]

FINDINGS: Right Kidney:

Renal measurements: 12.4 x 6.1 x 6.5 cm = volume: 254.2 mL .
Echogenicity within normal limits. No solid mass or hydronephrosis
visualized. Two small renal cysts are again seen.

Left Kidney:

Renal measurements: 13.1 x 5.8 x 5.2 cm = volume: 207.0 mL.
Echogenicity within normal limits. No mass or hydronephrosis
visualized.

Bladder:

Appears normal for degree of bladder distention.
IMPRESSION: Negative for hydronephrosis.  Negative exam.

## 2018-10-06 IMAGING — DX RIGHT SHOULDER - 2+ VIEW
4 series · 4 of 4 positions shown · non-contrast
Comparison: None.

CLINICAL DATA: Right arm pain and swelling.

EXAM:
RIGHT SHOULDER - 2+ VIEW

[shoulder grashey (1 of 2)]
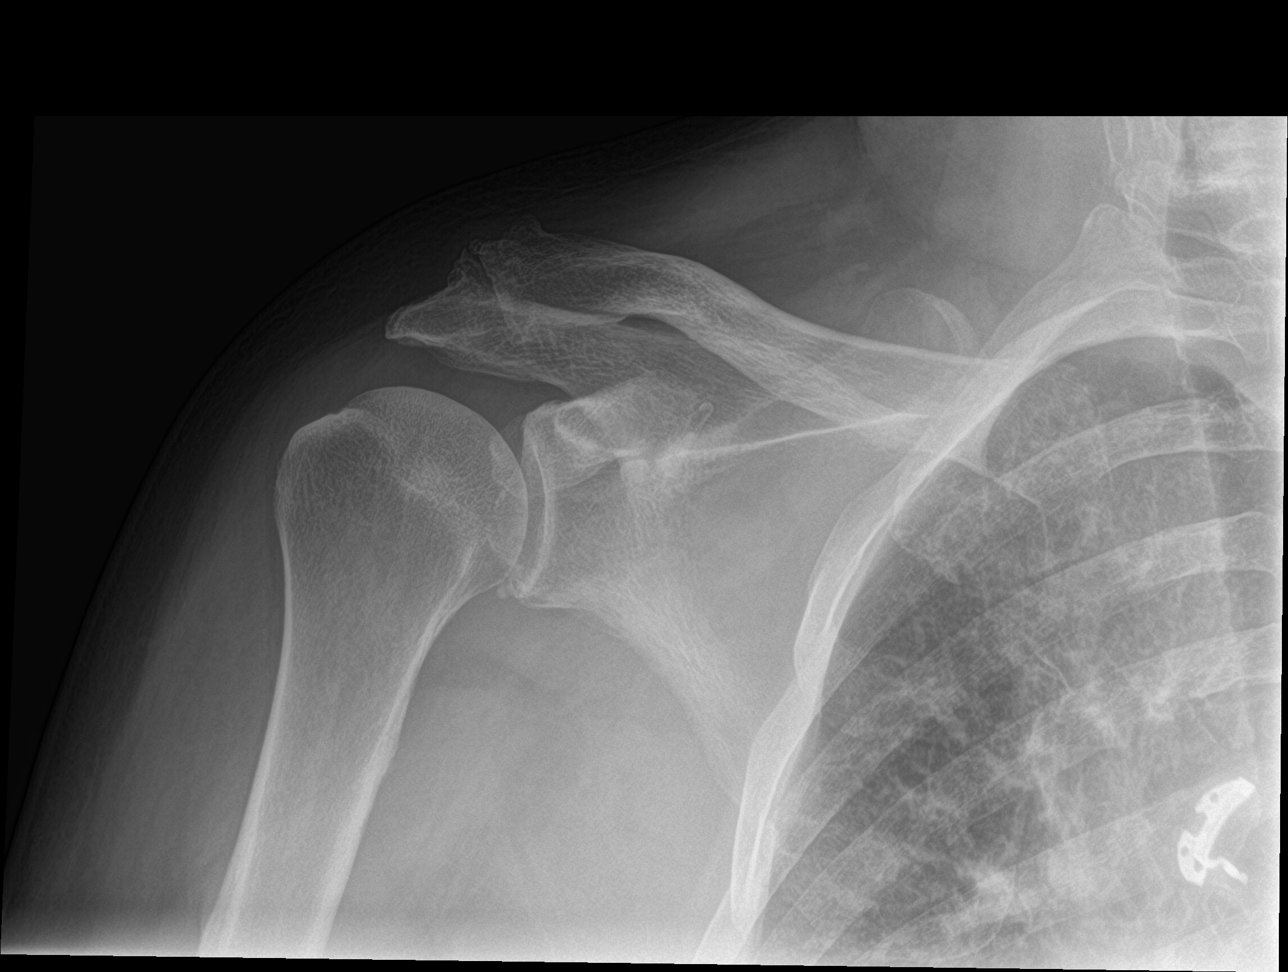

[shoulder y view]
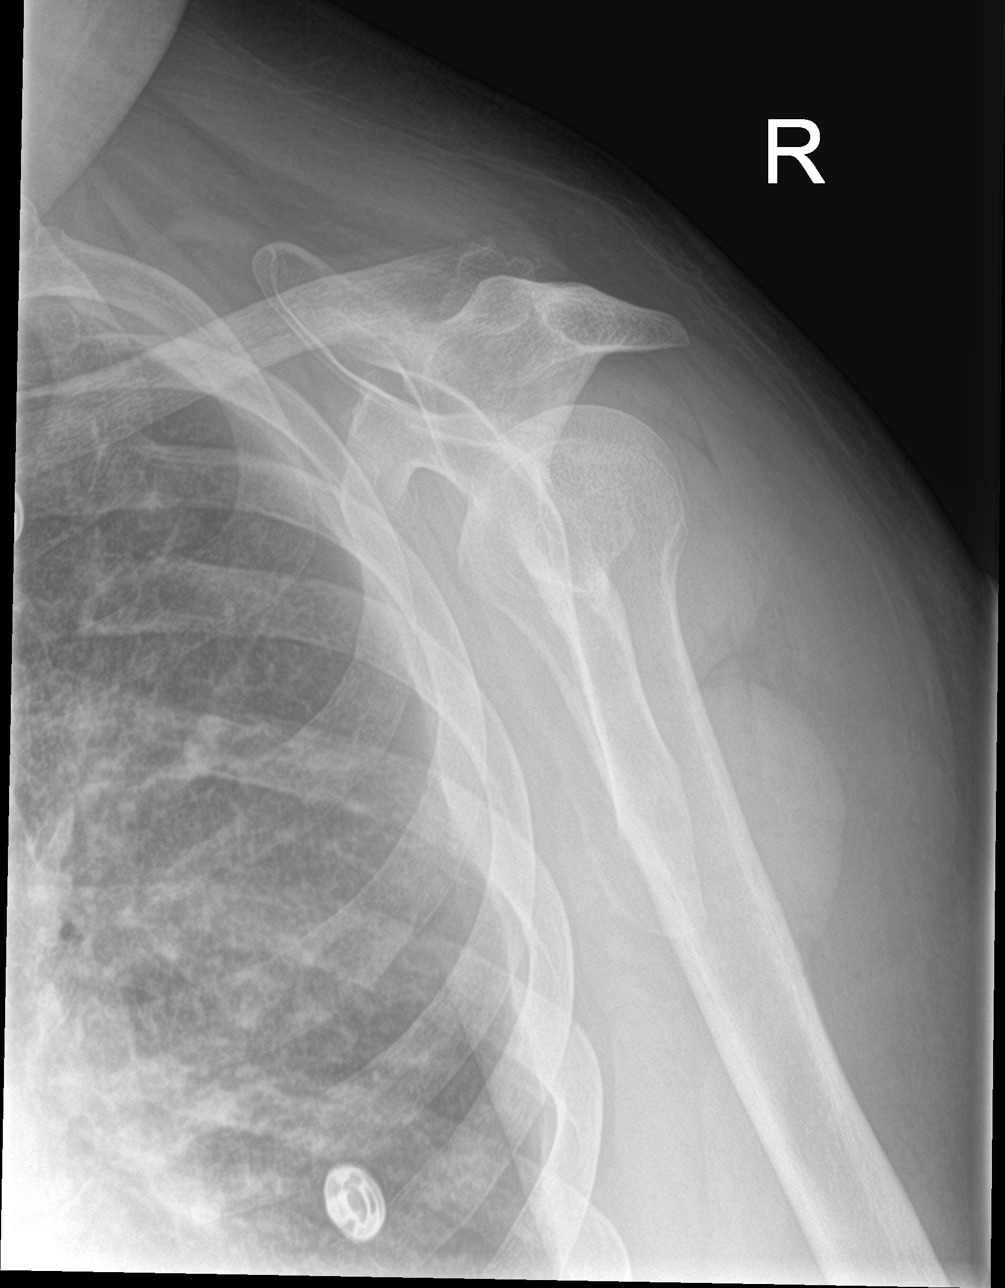

[shoulder axillary]
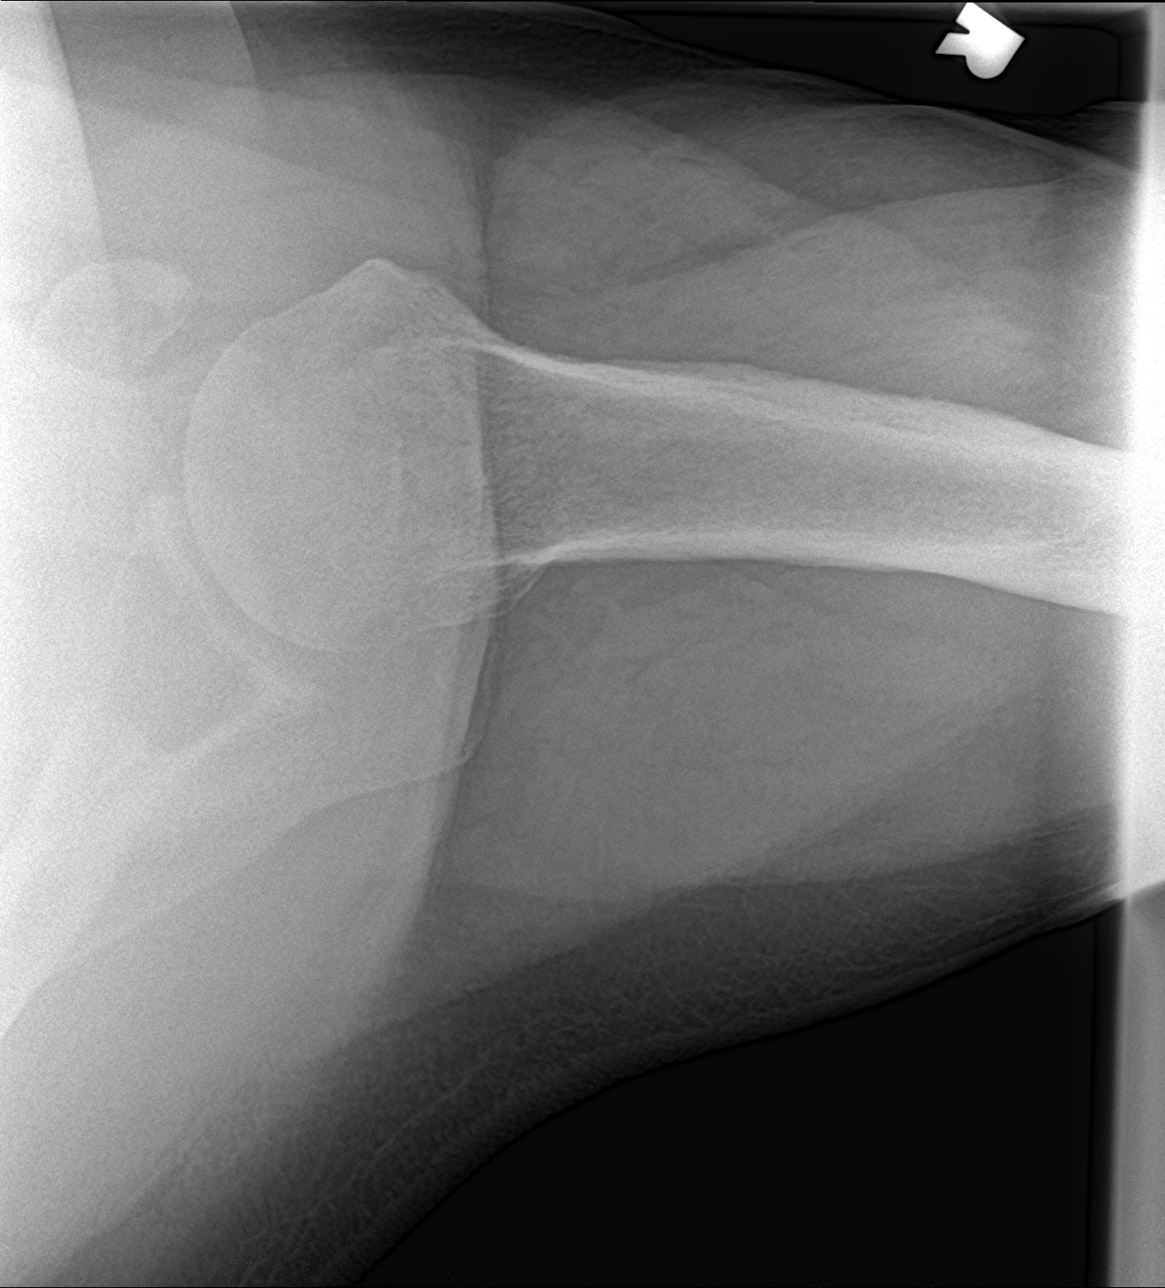

[shoulder grashey (2 of 2)]
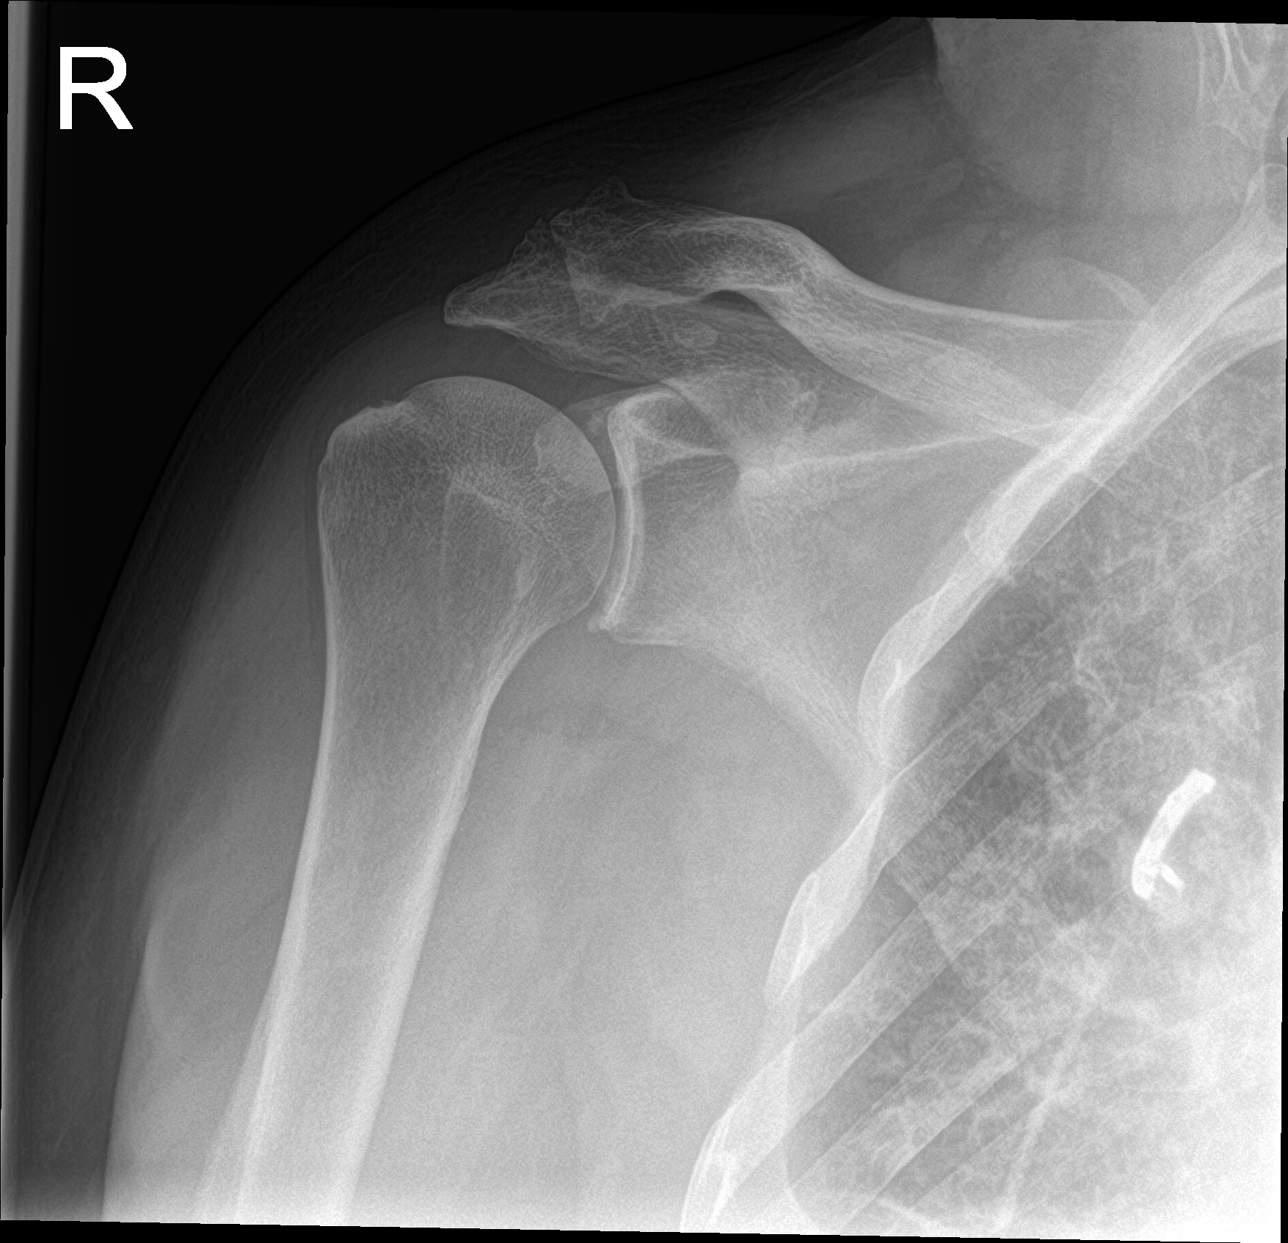

[4 of 4 positions shown; findings below may reference images not displayed]

FINDINGS: There is advanced acromioclavicular osteoarthritis. Mild
glenohumeral joint osteoarthritis noted. No fractures identified. No
radiopaque foreign bodies.
IMPRESSION: 1. No acute abnormality.
2. AC joint and glenohumeral joint osteoarthritis.

## 2018-10-06 IMAGING — DX RIGHT HUMERUS - 2+ VIEW
2 series · 2 of 2 positions shown · non-contrast
Comparison: None

CLINICAL DATA: Right arm pain.

EXAM:
RIGHT HUMERUS - 2+ VIEW

[humerus ap]
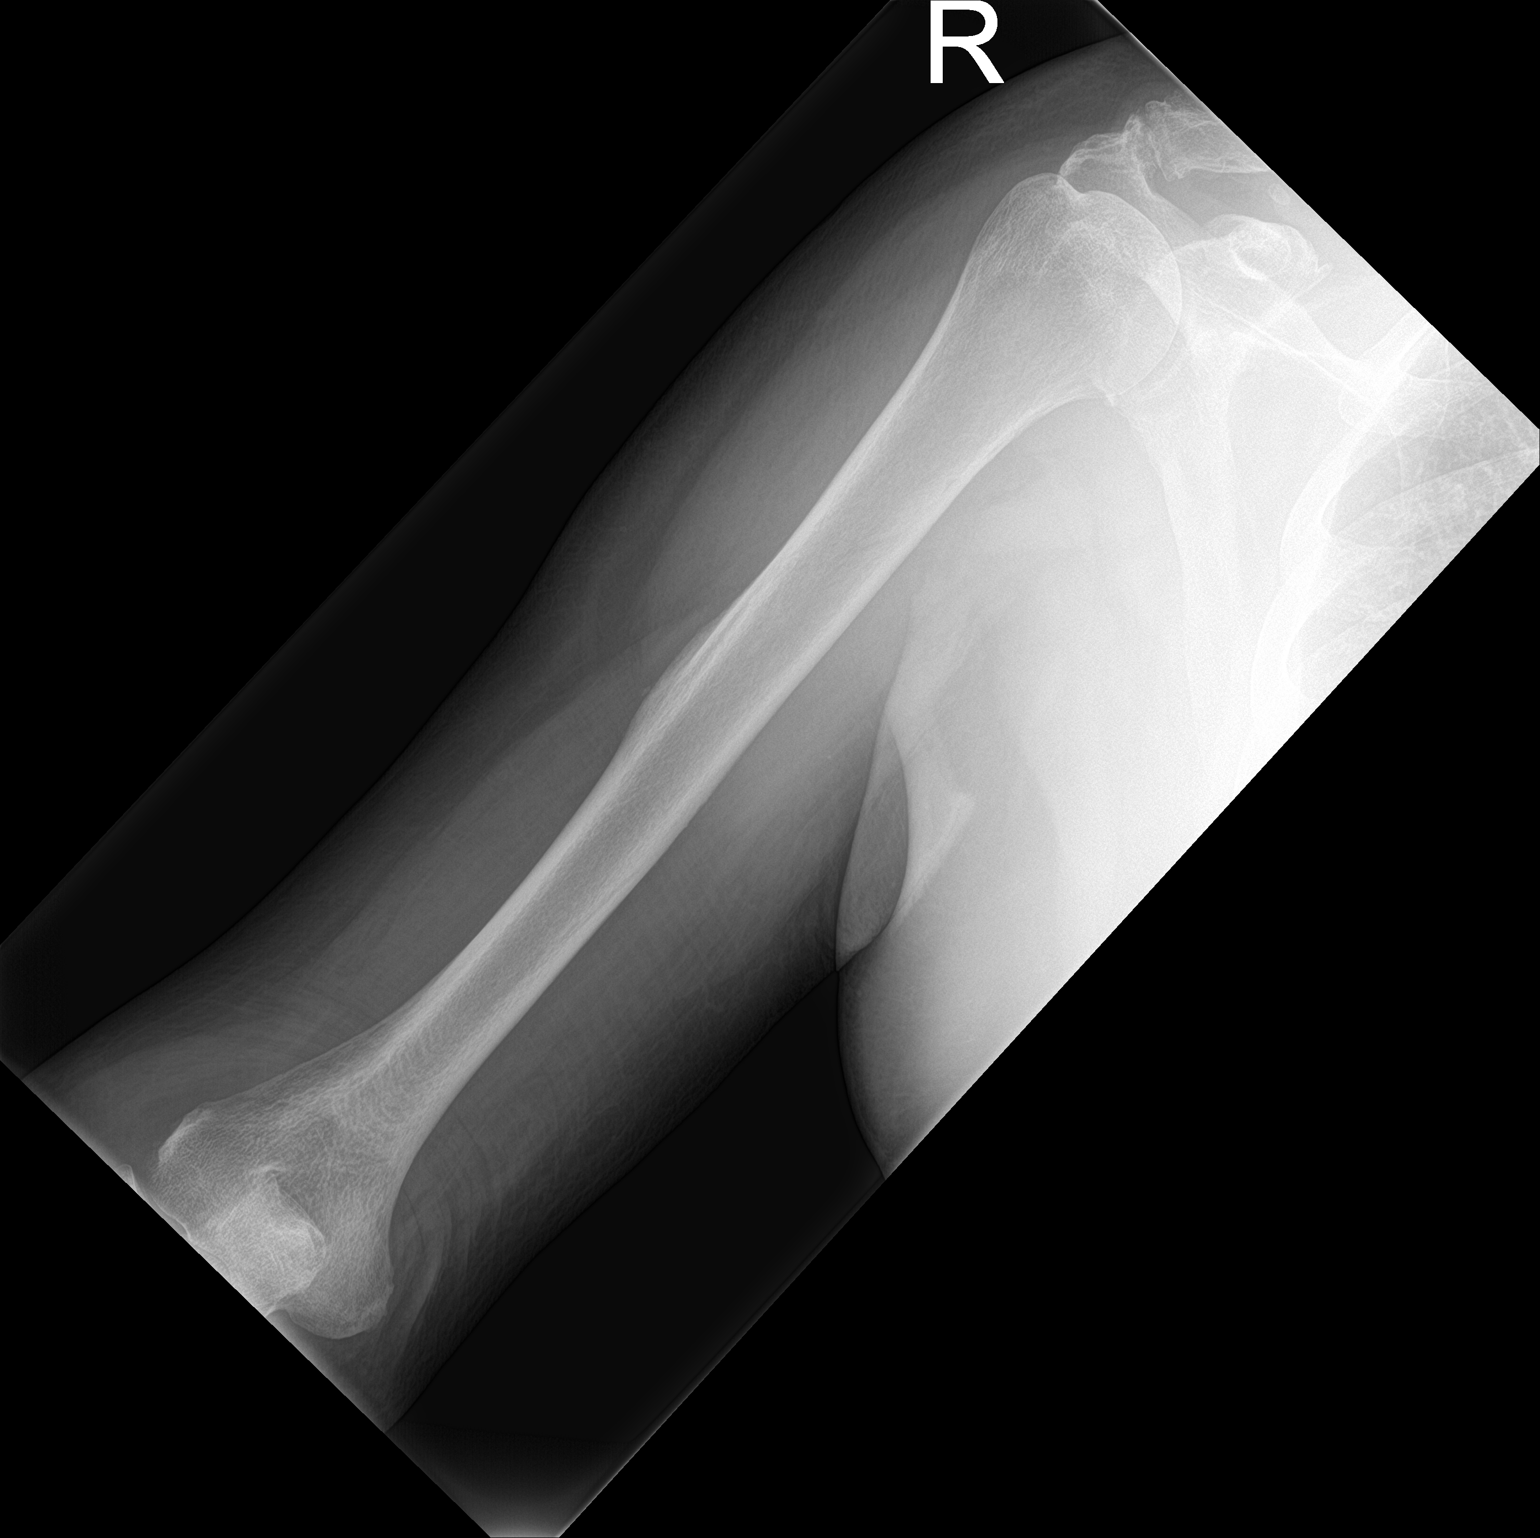

[humerus lat]
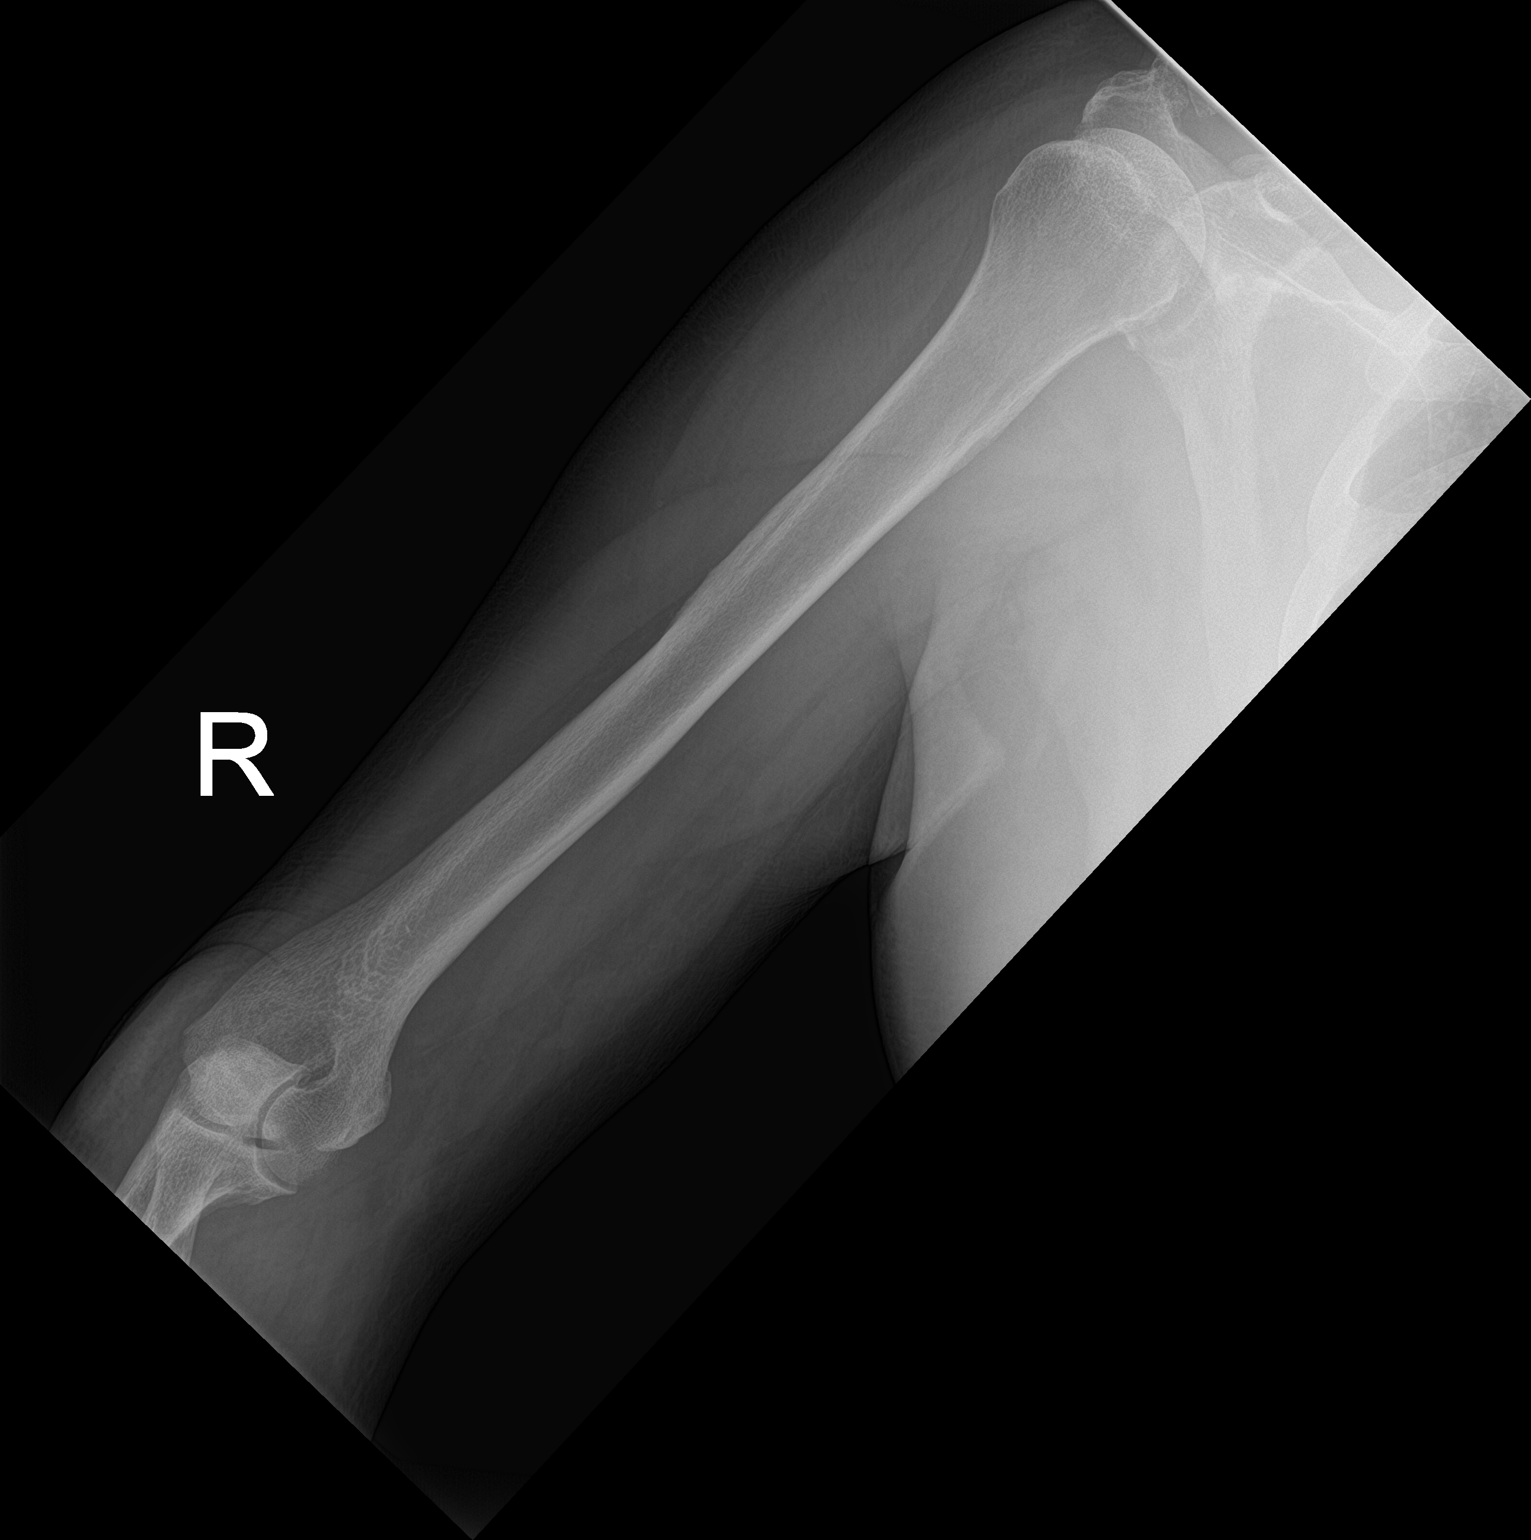

[2 of 2 positions shown; findings below may reference images not displayed]

FINDINGS: There is no evidence of fracture or other focal bone lesions.
Advanced acromioclavicular osteoarthritis noted. Mild glenohumeral
joint osteoarthritis also noted. Soft tissues are unremarkable.
IMPRESSION: 1. No acute findings.
2. Right AC joint and glenohumeral joint osteoarthritis.

## 2018-10-06 IMAGING — DX RIGHT ELBOW - COMPLETE 3+ VIEW
4 series · 4 of 4 positions shown · non-contrast
Comparison: None.

CLINICAL DATA: Right arm pain.  Swelling to distal elbow.

EXAM:
RIGHT ELBOW - COMPLETE 3+ VIEW

[elbow ap]
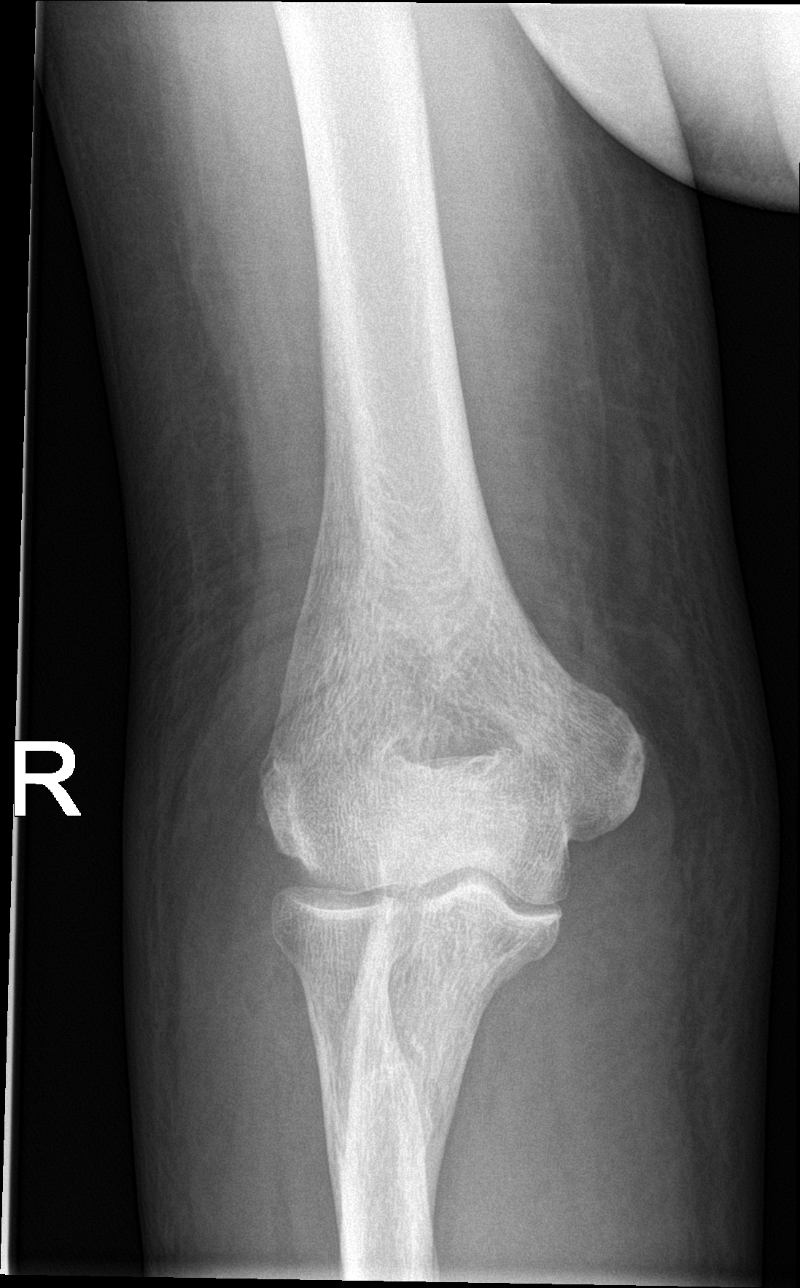

[elbow obl (1 of 2)]
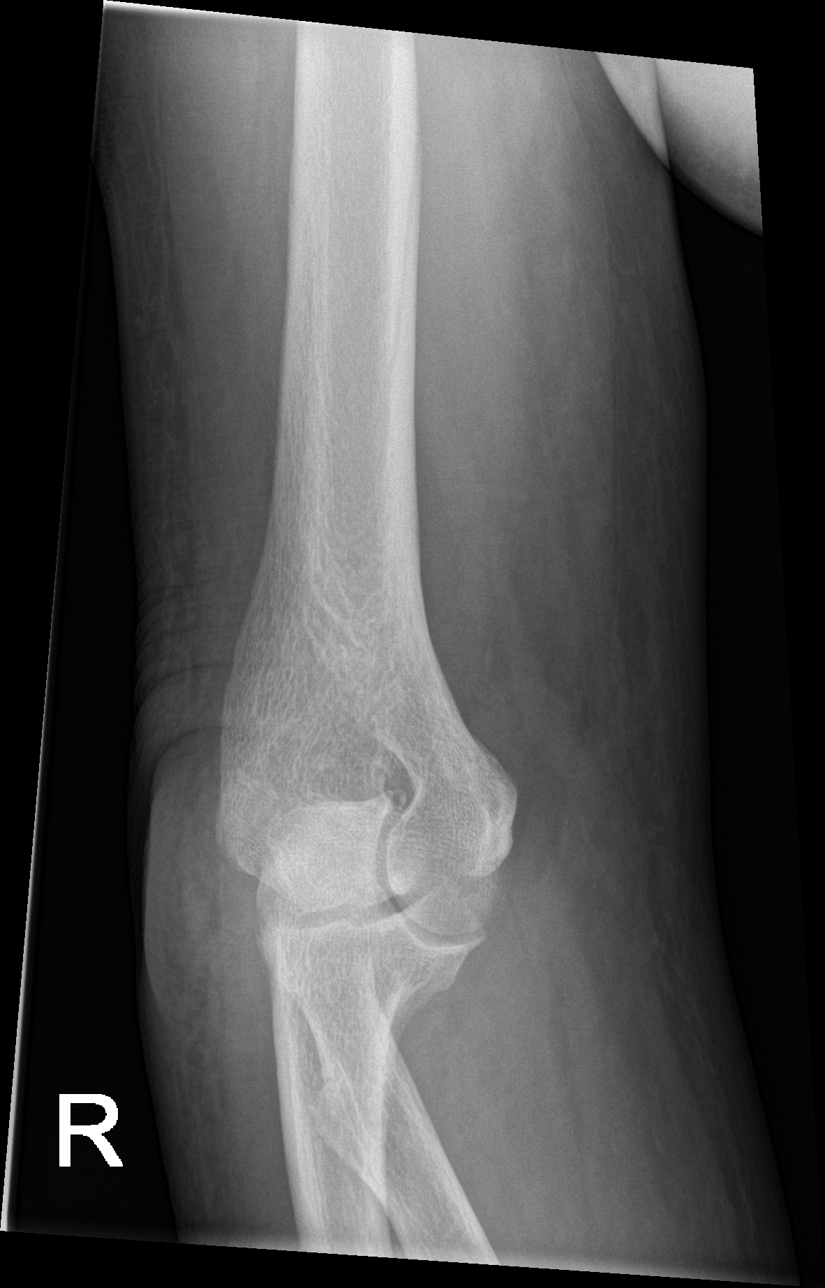

[elbow obl (2 of 2)]
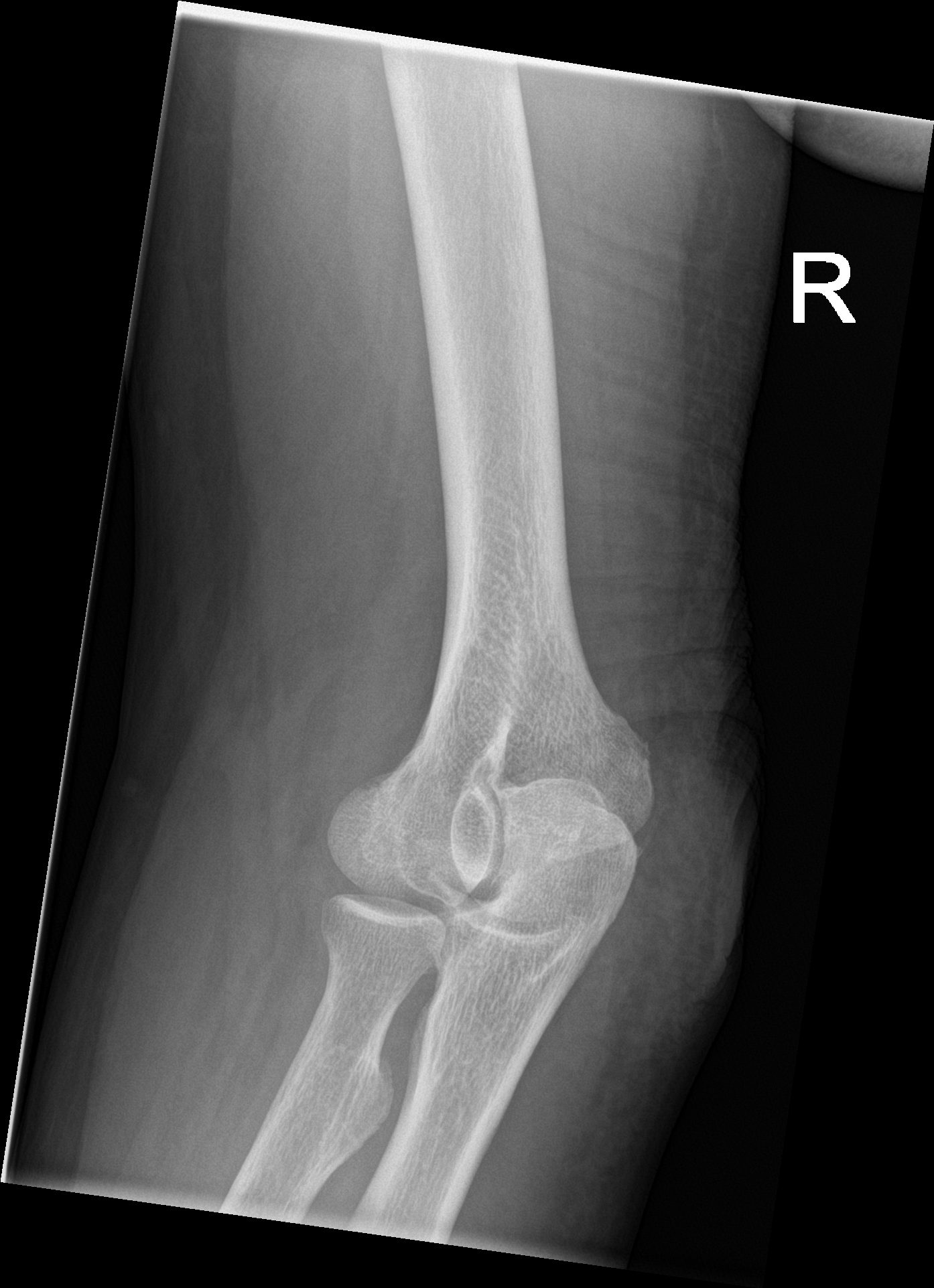

[elbow lat]
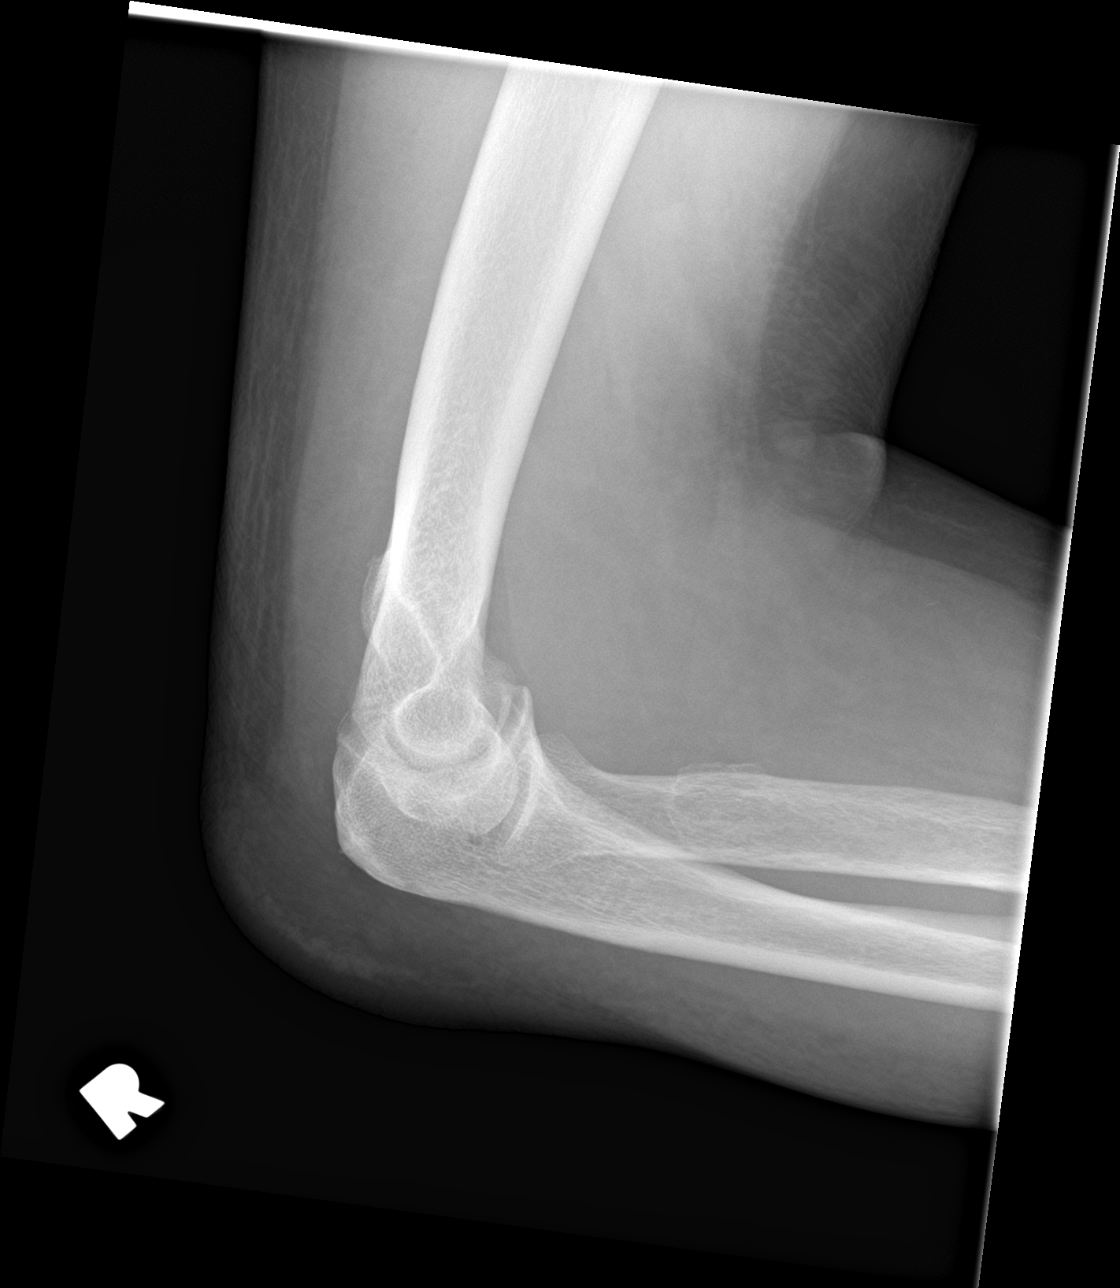

[4 of 4 positions shown; findings below may reference images not displayed]

FINDINGS: There is no evidence of fracture, dislocation, or joint effusion.
There is no evidence of arthropathy or other focal bone abnormality.
Diffuse soft tissue swelling/edema is identified.
IMPRESSION: 1. No acute bone abnormality.
2. Soft tissue swelling.

## 2018-10-06 MED ORDER — SODIUM CHLORIDE 0.9 % IV SOLN
INTRAVENOUS | Status: DC
  Administered 2018-10-06 (×2): via INTRAVENOUS

## 2018-10-06 MED ORDER — ENOXAPARIN SODIUM 40 MG/0.4ML ~~LOC~~ SOLN
40.0000 mg | SUBCUTANEOUS | Status: DC
  Administered 2018-10-06: 40 mg via SUBCUTANEOUS
  Filled 2018-10-06: qty 0.4

## 2018-10-06 MED ORDER — INSULIN GLARGINE 100 UNIT/ML ~~LOC~~ SOLN
25.0000 [IU] | Freq: Every day | SUBCUTANEOUS | Status: DC
  Administered 2018-10-06: 25 [IU] via SUBCUTANEOUS
  Filled 2018-10-06 (×3): qty 0.25

## 2018-10-06 MED ORDER — INSULIN ASPART 100 UNIT/ML ~~LOC~~ SOLN
6.0000 [IU] | Freq: Three times a day (TID) | SUBCUTANEOUS | Status: DC
  Administered 2018-10-06 – 2018-10-07 (×4): 6 [IU] via SUBCUTANEOUS

## 2018-10-06 MED ORDER — SODIUM CHLORIDE 0.9 % IV SOLN
510.0000 mg | Freq: Once | INTRAVENOUS | Status: AC
  Administered 2018-10-06: 510 mg via INTRAVENOUS
  Filled 2018-10-06: qty 17

## 2018-10-06 MED ORDER — PANTOPRAZOLE SODIUM 40 MG PO TBEC
40.0000 mg | DELAYED_RELEASE_TABLET | Freq: Two times a day (BID) | ORAL | Status: DC
  Administered 2018-10-06 – 2018-10-07 (×2): 40 mg via ORAL
  Filled 2018-10-06 (×2): qty 1

## 2018-10-06 MED ORDER — FUROSEMIDE 80 MG PO TABS
80.0000 mg | ORAL_TABLET | Freq: Every day | ORAL | Status: DC
  Administered 2018-10-06 – 2018-10-07 (×2): 80 mg via ORAL
  Filled 2018-10-06 (×2): qty 1

## 2018-10-06 NOTE — Progress Notes (Signed)
Per MD will adjust Protonix medication to BID. Will continue to monitor, if needed will consult GI.Wife and patient informed and agreeable.

## 2018-10-06 NOTE — Progress Notes (Signed)
Patient reported to me now that he has been feeling sick all day. He complains of abdominal pain/discomfor (refuses pain medications) and nausea. Zofran given and effective but not back to baseline yet. MD notified.

## 2018-10-06 NOTE — Progress Notes (Signed)
PROGRESS NOTE   [name]  CVE:[id number]    DOB: 04-29-1945    DOA: 10/03/2018  PCP: Shirline Frees, MD   I have briefly reviewed patients previous medical records in [facility].  Chief Complaint  Patient presents with   Chest Pain    Brief Narrative:  73 year old married male with PMH of CAD s/p stents x2 early July 0258, chronic diastolic CHF, DM 2, hypothyroid, HTN, HLD, CKD 3, OSA on CPAP, sarcoidosis and adrenal insufficiency on chronic prednisone, chronic leg edema, did relatively well for a week post stenting and then noted progressive dyspnea on exertion, fatigue, chest discomfort, worsening leg edema, nausea and right upper extremity pain.  Admitted for acute on chronic kidney disease.  Cardiology consulted.   Assessment & Plan:   Active Problems:   Sarcoidosis   Diabetes mellitus without complication ([place])   Coronary artery disease   GERD   Sleep apnea   Coronary atherosclerosis of native coronary artery   Chronic diastolic heart failure (HCC)   Pulmonary sarcoidosis (HCC)   Uncontrolled diabetes mellitus (HCC)   Hyperlipidemia   CKD (chronic kidney disease), stage III (HCC)   Iron deficiency anemia   AKI (acute kidney injury) (HCC)   Hypoalbuminemia   Hypokalemia   Hypothyroidism   Acute kidney injury superimposed on chronic kidney disease ([place])   Acute kidney injury complicating stage III chronic kidney disease  Baseline creatinine probably in the 1.4-1.5 range.  Creatinine on 6/2: 1.41.  Admitted with creatinine of 3.24.  Acute kidney injury likely due to dehydration from poor oral intake, ongoing high-dose diuretic use and ARB.  Held Lasix and ARB.  Renal ultrasound 7/29 and 8/1 without hydronephrosis.  His symptoms of generalized weakness and nausea may be related to this and are much better.  Despite almost 48 hours of IV fluid hydration, reports urine output lower than normal and creatinine has only slowly improved from 3.24 to 2.5 and  developing features of volume overload/lower extremity edema.  Nephrology thereby consulted, input appreciated, suspect AKI hemodynamically mediated as noted above complicating chronic kidney disease from DM and HTN.  Starting Lasix 80 mg daily.  Follow BMP.  Dyspnea on exertion, on admission  Unclear etiology.  CT chest without contrast shows no acute cardiopulmonary disease.  May be related to some uremic symptoms from acute kidney injury, obesity/OSA and generalized deconditioning.  Dyspnea significantly improved.  Lasix now restarted 8/1.  Monitor.  Uncontrolled DM2 with hyperglycemia and renal complications  N2D >78  Likely complicated by chronic prednisone use which she has been on for several years.  Mildly uncontrolled.  Adjusting insulins.  Increased Lantus to 25 units at bedtime, continue SSI  Increase NovoLog mealtime to 6 units 3 times daily.  Discontinued boost.  Patient reports that at home he takes Glucotrol, metformin and 70/30 insulin up to 30 units twice daily-all currently on hold.  Reportedly checks his blood sugars multiple times a day and says recently have been in the 24s.  Hyperlipidemia  Continue statins.  CAD s/p DES x2  Cardiology input appreciated.  Discussed with Dr. Acie Fredrickson today.  No typical angina symptoms and clinical picture not consistent with ACS.  Continue aspirin, Plavix, Crestor  States that he has been having right upper extremity pain since cardiac cath but no acute findings on exam  Chronic diastolic CHF  TTE 2/42/3536: LVEF 60-65%.  Lasix resumed today.  Anemia in CKD/iron deficiency  Stable.  No bleeding reported.  Nephrology giving IV iron.  Follow CBC.  Hypokalemia/hypomagnesemia Replaced.  Obesity Estimated body mass index is 33.91 kg/m as calculated from the following:   Height as of this encounter: 5\' 10"  (1.778 m).   Weight as of this encounter: 107.2 kg.   Nutritional status Nutrition Problem: Inadequate  oral intake Etiology: nausea Signs/Symptoms: meal completion < 50%, energy intake < 75% for > 7 days, per patient/family report Interventions: Boost Breeze, MVI   Sarcoidosis  CT chest without contrast without acute cardiopulmonary findings.  Continue chronic prednisone 10 mg daily.  GERD Continue PPI  OSA on CPAP Continue CPAP.  Hypothyroid Continue Synthroid.  Right upper extremity swelling  Reports since cardiac cath.  RUE venous Doppler without DVT.  No clinical features suggestive of cellulitis or other acute findings.  Elevate limb and monitor.  If persists, consider outpatient orthopedic consultation.  We will get x-ray of his right upper arm.  Low yield however.   DVT prophylaxis: Lovenox Code Status: Full Family Communication: Discussed in detail with patient spouse at bedside, updated care and answered questions.  She is reluctant to go home because she will not be able to return due to pandemic hospital visitor restrictions. Disposition: DC home pending clinical improvement   Consultants:  Cardiology Nephrology  Procedures:  None  Antimicrobials:  None   Subjective: Dyspnea much improved but not yet at baseline.  Developing some leg edema.  Anxious to go home.  States that he is not making as much urine as he used to.  Ongoing right upper extremity pain from elbow to shoulder.  Objective:  Vitals:   10/05/18 1943 10/06/18 0521 10/06/18 0822 10/06/18 1246  BP: (!) 150/75 (!) 142/81 (!) 141/67 (!) 124/47  Pulse: 64 72 67 70  Resp: 18 18 18 18   Temp: 98.6 F (37 C) 99.2 F (37.3 C)    TempSrc: Oral Oral    SpO2: 98% 98%  97%  Weight:  107.2 kg    Height:        Examination:  General exam: Pleasant elderly male, moderately built and obese lying comfortably supine in bed.  Oral mucosa moist. Respiratory system: Clear to auscultation.  No increased work of breathing. Cardiovascular system: S1 and S2 heard, RRR.  No JVD, murmurs.  Trace  bilateral leg edema.  Telemetry personally reviewed: SB in the 50s-SR. Gastrointestinal system: Abdomen is nondistended, soft and nontender. No organomegaly or masses felt. Normal bowel sounds heard. Central nervous system: Alert and oriented. No focal neurological deficits. Extremities: Symmetric 5 x 5 power.  Right upper extremity with very minimal diffuse swelling compared to left, no redness, increased warmth or tenderness.  Good radial pulses felt.  No acute findings to explain etiology of his right upper extremity pain. Skin: No rashes, lesions or ulcers Psychiatry: Judgement and insight appear normal. Mood & affect appropriate.     Data Reviewed: I have personally reviewed following labs and imaging studies  CBC: Recent Labs  Lab 10/03/18 1430 10/03/18 1923 10/04/18 0643 10/05/18 0557 10/06/18 0537  WBC 14.5* 13.6* 12.9* 11.4* 11.3*  NEUTROABS  --  10.4*  --   --   --   HGB 11.6* 11.2* 9.9* 9.5* 9.6*  HCT 36.4* 34.7* 30.4* 29.5* 30.6*  MCV 97.3 97.2 96.2 96.4 98.7  PLT 339 278 259 240 443   Basic Metabolic Panel: Recent Labs  Lab 10/03/18 1430 10/03/18 1923 10/04/18 0643 10/05/18 0557 10/06/18 0537  NA 138  --  137 137 139  K 3.4*  --  3.0* 4.3  3.8  CL 99  --  101 104 106  CO2 21*  --  26 26 26   GLUCOSE 241*  --  144* 202* 213*  BUN 41*  --  40* 38* 30*  CREATININE 3.24*  --  3.05* 2.96* 2.51*  CALCIUM 8.8*  --  8.5* 8.4* 8.2*  MG  --  1.8 1.6*  --  1.7  PHOS  --  4.8* 3.8  --   --    Liver Function Tests: Recent Labs  Lab 10/03/18 1923 10/04/18 0643  AST 14* 13*  ALT 16 14  ALKPHOS 52 46  BILITOT 0.9 0.6  PROT 7.0 6.1*  ALBUMIN 3.2* 2.9*    Cardiac Enzymes: No results for input(s): CKTOTAL, CKMB, CKMBINDEX, TROPONINI in the last 168 hours.  CBG: Recent Labs  Lab 10/05/18 1200 10/05/18 1616 10/05/18 2102 10/06/18 0608 10/06/18 1204  GLUCAP 243* 319* 294* 193* 212*    Recent Results (from the past 240 hour(s))  SARS Coronavirus 2 (CEPHEID  - Performed in [place] hospital lab), Hosp Order     Status: None   Collection Time: 10/03/18  4:50 PM   Specimen: Urine, Clean Catch; Nasopharyngeal  Result Value Ref Range Status   SARS Coronavirus 2 NEGATIVE NEGATIVE Final    Comment: (NOTE) If result is NEGATIVE SARS-CoV-2 target nucleic acids are NOT DETECTED. The SARS-CoV-2 RNA is generally detectable in upper and lower  respiratory specimens during the acute phase of infection. The lowest  concentration of SARS-CoV-2 viral copies this assay can detect is 250  copies / mL. A negative result does not preclude SARS-CoV-2 infection  and should not be used as the sole basis for treatment or other  patient management decisions.  A negative result may occur with  improper specimen collection / handling, submission of specimen other  than nasopharyngeal swab, presence of viral mutation(s) within the  areas targeted by this assay, and inadequate number of viral copies  (<250 copies / mL). A negative result must be combined with clinical  observations, patient history, and epidemiological information. If result is POSITIVE SARS-CoV-2 target nucleic acids are DETECTED. The SARS-CoV-2 RNA is generally detectable in upper and lower  respiratory specimens dur ing the acute phase of infection.  Positive  results are indicative of active infection with SARS-CoV-2.  Clinical  correlation with patient history and other diagnostic information is  necessary to determine patient infection status.  Positive results do  not rule out bacterial infection or co-infection with other viruses. If result is PRESUMPTIVE POSTIVE SARS-CoV-2 nucleic acids MAY BE PRESENT.   A presumptive positive result was obtained on the submitted specimen  and confirmed on repeat testing.  While 2019 novel coronavirus  (SARS-CoV-2) nucleic acids may be present in the submitted sample  additional confirmatory testing may be necessary for epidemiological  and / or  clinical management purposes  to differentiate between  SARS-CoV-2 and other Sarbecovirus currently known to infect humans.  If clinically indicated additional testing with an alternate test  methodology [id number]) is advised. The SARS-CoV-2 RNA is generally  detectable in upper and lower respiratory sp ecimens during the acute  phase of infection. The expected result is Negative. Fact Sheet for Patients:  StrictlyIdeas.no Fact Sheet for Healthcare Providers: BankingDealers.co.za This test is not yet approved or cleared by the [place] FDA and has been authorized for detection and/or diagnosis of SARS-CoV-2 by FDA under an Emergency Use Authorization (EUA).  This EUA will remain in effect (meaning this test  can be used) for the duration of the COVID-19 declaration under Section 564(b)(1) of the Act, 21 U.S.C. section 360bbb-3(b)(1), unless the authorization is terminated or revoked sooner. Performed at [facility], [place] [address] [place], [place] [address]          Radiology Studies: US Renal  Result Date: 10/06/2018 CLINICAL DATA:  Acute kidney injury. EXAM: RENAL / URINARY TRACT ULTRASOUND COMPLETE COMPARISON:  CT abdomen and pelvis 04/20/2018. Renal ultrasound 10/03/2018. FINDINGS: Right Kidney: Renal measurements: 12.4 x 6.1 x 6.5 cm = volume: 254.2 mL . Echogenicity within normal limits. No solid mass or hydronephrosis visualized. Two small renal cysts are again seen. Left Kidney: Renal measurements: 13.1 x 5.8 x 5.2 cm = volume: 207.0 mL. Echogenicity within normal limits. No mass or hydronephrosis visualized. Bladder: Appears normal for degree of bladder distention. IMPRESSION: Negative for hydronephrosis.  Negative exam. Electronically Signed   By: Inge Rise M.D.   On: 10/06/2018 12:08   Vas [place] Upper Extremity Venous Duplex  Result Date: 10/05/2018 UPPER VENOUS STUDY  Indications: Swelling, and right radial  artery catheterization 08/07/2018 Limitations: Body habitus and poor ultrasound/tissue interface. Performing Technologist: Maudry Mayhew MHA, RDMS, RVT, RDCS  Examination Guidelines: A complete evaluation includes B-mode imaging, spectral Doppler, color Doppler, and power Doppler as needed of all accessible portions of each vessel. Bilateral testing is considered an integral part of a complete examination. Limited examinations for reoccurring indications may be performed as noted.  Right Findings: +----------+------------+---------+-----------+----------+-------+  RIGHT      Compressible Phasicity Spontaneous Properties Summary  +----------+------------+---------+-----------+----------+-------+  IJV            Full        Yes        Yes                         +----------+------------+---------+-----------+----------+-------+  Subclavian     Full        Yes        Yes                         +----------+------------+---------+-----------+----------+-------+  Axillary       Full        Yes        Yes                         +----------+------------+---------+-----------+----------+-------+  Brachial       Full        Yes        Yes                         +----------+------------+---------+-----------+----------+-------+  Radial         Full                                               +----------+------------+---------+-----------+----------+-------+  Ulnar          Full                                               +----------+------------+---------+-----------+----------+-------+  Cephalic       Full                                               +----------+------------+---------+-----------+----------+-------+  Basilic        Full                                               +----------+------------+---------+-----------+----------+-------+  Left Findings: +----------+------------+---------+-----------+----------+-------+  LEFT       Compressible Phasicity Spontaneous Properties Summary   +----------+------------+---------+-----------+----------+-------+  Subclavian                 Yes        Yes                         +----------+------------+---------+-----------+----------+-------+  Summary:  Right: No evidence of deep vein thrombosis in the upper extremity. No evidence of superficial vein thrombosis in the upper extremity.  Left: No evidence of thrombosis in the subclavian.  *See table(s) above for measurements and observations.  Diagnosing physician: Curt Jews MD Electronically signed by Curt Jews MD on 10/05/2018 at 1:48:20 PM.    Final         Scheduled Meds:  aspirin EC  81 mg Oral QPM   clopidogrel  75 mg Oral Daily   enoxaparin (LOVENOX) injection  40 mg Subcutaneous Q24H   furosemide  80 mg Oral Daily   insulin aspart  0-5 Units Subcutaneous QHS   insulin aspart  0-9 Units Subcutaneous TID WC   insulin aspart  6 Units Subcutaneous TID WC   insulin glargine  20 Units Subcutaneous QHS   levothyroxine  50 mcg Oral Q0600   multivitamin with minerals  1 tablet Oral Daily   pantoprazole  40 mg Oral Daily   predniSONE  10 mg Oral Q breakfast   rosuvastatin  10 mg Oral QHS   Continuous Infusions:  sodium chloride 100 mL/hr at 10/06/18 0825     LOS: 2 days     Vernell Leep, MD, FACP, [facility]. [facility]  To contact the attending provider between 7A-7P or the covering provider during after hours 7P-7A, please log into the web site [email or web address] and access using universal [place] password for that web site. If you do not have the password, please call the hospital operator.  10/06/2018, 4:05 PM

## 2018-10-06 NOTE — Consult Note (Signed)
Reason for Consult: Acute kidney injury on chronic kidney disease stage III Referring Physician: Vernell Leep MD [facility])  HPI:  73 year old man with past medical history significant for hypertension, insulin-dependent diabetes mellitus, obstructive sleep apnea, sarcoidosis on chronic prednisone therapy with relative adrenal insufficiency, coronary artery disease status post stent placement in June, 1610, diastolic heart failure (EF 60-65%) and chronic kidney disease stage III at baseline (creatinine 1.4-1.5).  Presented to the hospital 3 days ago with chest tightness, shortness of breath and fatigue along with nausea that limited his oral intake with continued use of diuretics (furosemide 80 mg twice daily) and ARB (losartan 100 mg daily).  On admission, noted to have an elevated creatinine of 3.2 that has gradually improved to 2.5 with intravenous fluids while holding diuretics/ARB.  His fractional excretion of sodium was 0.2% in spite of diuretic use.  His weight is up by 8 pounds since admission but paradoxically input/output showed net -1.4 L.  He denies any prior history of acute kidney injury, recurrent nephrolithiasis or recurrent urinary tract infections.  Reports that he has had at least 2 or 3 weeks of progressively worsening nausea/decreased oral intake with intermittent vomiting.  He has been having chronic back pain but denies the use of nonsteroidal anti-inflammatory drugs.  He denies any hematuria, foamy urine, skin rash, joint symptoms, epistaxis/hemoptysis or history of asthma.  He denies any dysuria, urgency, frequency, flank pain but has some nocturia.   Past Medical History:  Diagnosis Date  . Adrenal insufficiency ([place])   . Allergy   . Anemia   . Arthritis    feet   . CKD (chronic kidney disease), stage III ([place])   . Coronary artery disease    has stents  . Coronary atherosclerosis of native coronary artery    Proximal LAD, posterior lateral stent widely patent-10/12/11  .  Diabetes mellitus    insulin and pills  . Hearing loss    wears hearing aids  . Heart attack ([place]) 2010  . History of blood transfusion 06/30/2016   [facility] - 2 units transfused  . Hypertension   . OSA (obstructive sleep apnea)    uses VPAC sleep study 2 years done through [facility]. Dr. Marlou Porch arranged study  . Pneumonia    3-4 years ago  . Sarcoid   . Sleep apnea    uses CPAP nightly  . Thyroid disease   . Type 2 diabetes mellitus ([place])     Past Surgical History:  Procedure Laterality Date  . APPENDECTOMY    . CATARACT EXTRACTION  2011   bilat  . CHOLECYSTECTOMY  01/25/2011   Procedure: LAPAROSCOPIC CHOLECYSTECTOMY WITH INTRAOPERATIVE CHOLANGIOGRAM;  Surgeon: Judieth Keens, DO;  Location: [facility];  Service: General;  Laterality: N/A;  . COLONOSCOPY     several  . CORONARY ANGIOPLASTY     most recent 11/2009  . CORONARY STENT INTERVENTION N/A 08/07/2018   Procedure: CORONARY STENT INTERVENTION;  Surgeon: Jettie Booze, MD;  Location: [facility];  Service: Cardiovascular;  Laterality: N/A;  . CORONARY STENT PLACEMENT  2009   in LAD and side branch PTCA  . INTERCOSTAL NERVE BLOCK  2011, 06/2016   x2. lumbar spine  . LEFT HEART CATHETERIZATION WITH CORONARY ANGIOGRAM Bilateral 10/12/2011   Procedure: LEFT HEART CATHETERIZATION WITH CORONARY ANGIOGRAM;  Surgeon: Candee Furbish, MD;  Location: [facility] C[facility] LAB;  Service: Cardiovascular;  Laterality: Bilateral;  . LEFT HEART CATHETERIZATION WITH CORONARY ANGIOGRAM N/A 04/01/2014   Procedure: LEFT HEART CATHETERIZATION  WITH CORONARY ANGIOGRAM;  Surgeon: Candee Furbish, MD;  Location: [facility] C[facility] LAB;  Service: Cardiovascular;  Laterality: N/A;  . RIGHT/LEFT HEART CATH AND CORONARY ANGIOGRAPHY N/A 08/07/2018   Procedure: RIGHT/LEFT HEART CATH AND CORONARY ANGIOGRAPHY;  Surgeon: Jettie Booze, MD;  Location: [place] [facility];  Service: Cardiovascular;  Laterality: N/A;    Family History  Problem Relation Age of Onset  .  Kidney disease Mother   . Diabetes Mother   . Kidney cancer Mother   . Heart attack Father   . Asthma Sister   . Anesthesia problems Sister        "Kidney's did not wake up"  . Colon cancer Neg Hx   . Colon polyps Neg Hx   . Rectal cancer Neg Hx   . Stomach cancer Neg Hx     Social History:  reports that he has never smoked. He has never used smokeless tobacco. He reports that he does not drink alcohol or use drugs.  Allergies:  Allergies  Allergen Reactions  . Hydrocortisone Nausea Only  . Metolazone     Drained, no energy  . Statins Other (See Comments)    Pt says they make him "mean"    Medications:  Scheduled: . aspirin EC  81 mg Oral QPM  . clopidogrel  75 mg Oral Daily  . enoxaparin (LOVENOX) injection  40 mg Subcutaneous Q24H  . insulin aspart  0-5 Units Subcutaneous QHS  . insulin aspart  0-9 Units Subcutaneous TID WC  . insulin aspart  6 Units Subcutaneous TID WC  . insulin glargine  20 Units Subcutaneous QHS  . levothyroxine  50 mcg Oral Q0600  . multivitamin with minerals  1 tablet Oral Daily  . pantoprazole  40 mg Oral Daily  . predniSONE  10 mg Oral Q breakfast  . rosuvastatin  10 mg Oral QHS    BMP Latest Ref Rng & Units 10/06/2018 10/05/2018 10/04/2018  Glucose 70 - 99 mg/dL 213(H) 202(H) 144(H)  BUN 8 - 23 mg/dL 30(H) 38(H) 40(H)  Creatinine 0.61 - 1.24 mg/dL 2.51(H) 2.96(H) 3.05(H)  BUN/Creat Ratio 10 - 24 - - -  Sodium 135 - 145 mmol/L 139 137 137  Potassium 3.5 - 5.1 mmol/L 3.8 4.3 3.0(L)  Chloride 98 - 111 mmol/L 106 104 101  CO2 22 - 32 mmol/L 26 26 26   Calcium 8.9 - 10.3 mg/dL 8.2(L) 8.4(L) 8.5(L)   CBC Latest Ref Rng & Units 10/06/2018 10/05/2018 10/04/2018  WBC 4.0 - 10.5 K/uL 11.3(H) 11.4(H) 12.9(H)  Hemoglobin 13.0 - 17.0 g/dL 9.6(L) 9.5(L) 9.9(L)  Hematocrit 39.0 - 52.0 % 30.6(L) 29.5(L) 30.4(L)  Platelets 150 - 400 K/uL 237 240 259   Urinalysis    Component Value Date/Time   COLORURINE YELLOW 10/03/2018 1720   APPEARANCEUR CLEAR  10/03/2018 1720   LABSPEC 1.015 10/03/2018 1720   PHURINE 5.0 10/03/2018 1720   GLUCOSEU NEGATIVE 10/03/2018 1720   HGBUR NEGATIVE 10/03/2018 1720   BILIRUBINUR NEGATIVE 10/03/2018 1720   KETONESUR NEGATIVE 10/03/2018 1720   PROTEINUR 30 (A) 10/03/2018 1720   UROBILINOGEN 0.2 11/09/2010 2146   NITRITE NEGATIVE 10/03/2018 1720   LEUKOCYTESUR NEGATIVE 10/03/2018 1720    Vas [place] Upper Extremity Venous Duplex  Result Date: 10/05/2018 UPPER VENOUS STUDY  Indications: Swelling, and right radial artery catheterization 08/07/2018 Limitations: Body habitus and poor ultrasound/tissue interface. Performing Technologist: Maudry Mayhew MHA, RDMS, RVT, RDCS  Examination Guidelines: A complete evaluation includes B-mode imaging, spectral Doppler, color Doppler, and power Doppler as  needed of all accessible portions of each vessel. Bilateral testing is considered an integral part of a complete examination. Limited examinations for reoccurring indications may be performed as noted.  Right Findings: +----------+------------+---------+-----------+----------+-------+ RIGHT     CompressiblePhasicitySpontaneousPropertiesSummary +----------+------------+---------+-----------+----------+-------+ IJV           Full       Yes       Yes                      +----------+------------+---------+-----------+----------+-------+ Subclavian    Full       Yes       Yes                      +----------+------------+---------+-----------+----------+-------+ Axillary      Full       Yes       Yes                      +----------+------------+---------+-----------+----------+-------+ Brachial      Full       Yes       Yes                      +----------+------------+---------+-----------+----------+-------+ Radial        Full                                          +----------+------------+---------+-----------+----------+-------+ Ulnar         Full                                           +----------+------------+---------+-----------+----------+-------+ Cephalic      Full                                          +----------+------------+---------+-----------+----------+-------+ Basilic       Full                                          +----------+------------+---------+-----------+----------+-------+  Left Findings: +----------+------------+---------+-----------+----------+-------+ LEFT      CompressiblePhasicitySpontaneousPropertiesSummary +----------+------------+---------+-----------+----------+-------+ Subclavian               Yes       Yes                      +----------+------------+---------+-----------+----------+-------+  Summary:  Right: No evidence of deep vein thrombosis in the upper extremity. No evidence of superficial vein thrombosis in the upper extremity.  Left: No evidence of thrombosis in the subclavian.  *See table(s) above for measurements and observations.  Diagnosing physician: Curt Jews MD Electronically signed by Curt Jews MD on 10/05/2018 at 1:48:20 PM.    Final     Review of Systems  Constitutional: Positive for malaise/fatigue. Negative for chills and fever.  HENT: Negative.   Eyes: Negative.   Respiratory: Negative for cough, hemoptysis and shortness of breath.   Cardiovascular: Positive for leg swelling. Negative for chest pain, palpitations, orthopnea and claudication.  Gastrointestinal: Negative for abdominal pain, diarrhea, nausea and vomiting.  Genitourinary: Positive  for flank pain. Negative for dysuria, frequency and urgency.       Bilateral flank pain  Musculoskeletal: Positive for back pain and myalgias. Negative for neck pain.  Skin: Negative.   Neurological: Negative.    Blood pressure (!) 141/67, pulse 67, temperature 99.2 F (37.3 C), temperature source Oral, resp. rate 18, height 5\' 10"  (1.778 m), weight 107.2 kg, SpO2 98 %. Physical Exam  Nursing note and vitals reviewed. Constitutional: He is oriented  to person, place, and time. He appears well-developed and well-nourished. No distress.  HENT:  Head: Normocephalic and atraumatic.  Mouth/Throat: Oropharynx is clear and moist. No oropharyngeal exudate.  Eyes: Pupils are equal, round, and reactive to light. Conjunctivae are normal. No scleral icterus.  Neck: Normal range of motion. Neck supple. No JVD present. No tracheal deviation present.  Cardiovascular: Normal rate, regular rhythm and normal heart sounds.  No murmur heard. Respiratory: Effort normal and breath sounds normal. He has no wheezes. He has no rales.  GI: Soft. Bowel sounds are normal. He exhibits distension. There is no abdominal tenderness. There is no rebound and no guarding.  Musculoskeletal: Normal range of motion.        General: Edema present.     Comments: 1-2+ left ankle edema, trace right ankle edema.  Neurological: He is alert and oriented to person, place, and time.  Skin: Skin is warm and dry. No rash noted. No erythema.    Assessment/Plan: 1.  Acute kidney injury on chronic kidney disease stage III: History, timeline of events and available database pointing to hemodynamically mediated acute kidney injury likely prerenal azotemia with decreased oral intake/increased GI losses in the setting of ongoing ARB/diuretic.  Initial urinalysis/urine sediment did not support the presence of a GN and I will repeat this today along with repeat urine electrolytes.  Renal ultrasound will be requested to evaluate for possible obstruction and given history of chronic back pain, will send off SPEP/free light chains.  He does have some asymmetric lower extremity edema afflicting the left leg and I will restart him back on (lower dose) furosemide 80 mg daily.  Recommend holding his ARB until creatinine is back to baseline.  No acute electrolyte abnormality or uremic type symptoms/signs to prompt need for dialysis.  Continue to avoid nonsteroidal anti-inflammatory drugs, iodinated  intravenous contrast and hypotension.  I suspect his underlying chronic kidney disease is primarily from hypertension and diabetes with the latter likely playing a larger role-we will quantify urine protein/creatinine ratio. 2.  Anemia: Significantly iron deficiency noted on labs with iron saturation of 8% and ferritin of 80.  Will give intravenous iron. 3.  Hypertension: Blood pressure marginally elevated, monitor with reinitiation of diuretic. 4.  Chronic sarcoidosis with relative adrenal insufficiency: Continue on current outpatient prednisone dose. 5.  Diabetes mellitus: Suboptimally controlled with a hemoglobin A1c of 10.2% possibly exacerbated by ongoing prednisone use.  Husain Costabile K. 10/06/2018, 10:40 AM

## 2018-10-06 NOTE — Progress Notes (Signed)
Progress Note  Patient Name: [name] Date of Encounter: 10/06/2018  Primary Cardiologist: Candee Furbish, MD    Subjective    73 year old with history of coronary artery disease-status post stenting in July, 2020.  He has a history of chronic diastolic congestive heart failure, type 2 diabetes mellitus, hypertension, hypothyroidism, chronic kidney disease.  He has obstructive sleep apnea.  He was admitted with weakness and shortness of breath and was found to be in acute renal failure.   Still getting IV NS.   Creatinine is improving slowly .   Still not to baseline despite IV hydration   Inpatient Medications    Scheduled Meds: . aspirin EC  81 mg Oral QPM  . clopidogrel  75 mg Oral Daily  . enoxaparin (LOVENOX) injection  40 mg Subcutaneous Q24H  . insulin aspart  0-5 Units Subcutaneous QHS  . insulin aspart  0-9 Units Subcutaneous TID WC  . insulin aspart  6 Units Subcutaneous TID WC  . insulin glargine  20 Units Subcutaneous QHS  . levothyroxine  50 mcg Oral Q0600  . multivitamin with minerals  1 tablet Oral Daily  . pantoprazole  40 mg Oral Daily  . predniSONE  10 mg Oral Q breakfast  . rosuvastatin  10 mg Oral QHS   Continuous Infusions: . sodium chloride 100 mL/hr at 10/06/18 0825   PRN Meds: acetaminophen **OR** acetaminophen, albuterol, HYDROcodone-acetaminophen, montelukast, ondansetron **OR** ondansetron (ZOFRAN) IV   Vital Signs    Vitals:   10/05/18 1307 10/05/18 1943 10/06/18 0521 10/06/18 0822  BP: 137/63 (!) 150/75 (!) 142/81 (!) 141/67  Pulse: 71 64 72 67  Resp: 20 18 18 18   Temp: 98.3 F (36.8 C) 98.6 F (37 C) 99.2 F (37.3 C)   TempSrc: Oral Oral Oral   SpO2: 96% 98% 98%   Weight:   107.2 kg   Height:        Intake/Output Summary (Last 24 hours) at 10/06/2018 1054 Last data filed at 10/06/2018 2706 Gross per 24 hour  Intake 2397.11 ml  Output 3125 ml  Net -727.89 ml   Last 3 Weights 10/06/2018 10/05/2018 10/04/2018  Weight (lbs) 236 lb 4.8 oz  231 lb 228 lb 11.2 oz  Weight (kg) 107.185 kg 104.781 kg 103.738 kg      Telemetry     NSR - Personally Reviewed  ECG     NSR  - Personally Reviewed  Physical Exam   GEN:  middle age male ,  NAD  Neck: No JVD Cardiac:  RR  Respiratory: Clear to auscultation bilaterally. GI: Soft, nontender, non-distended  MS: No edema; No deformity. Neuro:  Nonfocal  Psych: Normal affect   Labs    High Sensitivity Troponin:   Recent Labs  Lab 10/03/18 1430 10/03/18 1628  TROPONINIHS 13 15      Cardiac EnzymesNo results for input(s): TROPONINI in the last 168 hours. No results for input(s): TROPIPOC in the last 168 hours.   Chemistry Recent Labs  Lab 10/03/18 1923 10/04/18 0643 10/05/18 0557 10/06/18 0537  NA  --  137 137 139  K  --  3.0* 4.3 3.8  CL  --  101 104 106  CO2  --  26 26 26   GLUCOSE  --  144* 202* 213*  BUN  --  40* 38* 30*  CREATININE  --  3.05* 2.96* 2.51*  CALCIUM  --  8.5* 8.4* 8.2*  PROT 7.0 6.1*  --   --   ALBUMIN 3.2*  2.9*  --   --   AST 14* 13*  --   --   ALT 16 14  --   --   ALKPHOS 52 46  --   --   BILITOT 0.9 0.6  --   --   GFRNONAA  --  19* 20* 25*  GFRAA  --  23* 23* 29*  ANIONGAP  --  10 7 7      Hematology Recent Labs  Lab 10/04/18 0643 10/05/18 0557 10/06/18 0537  WBC 12.9* 11.4* 11.3*  RBC 3.16*  3.16* 3.06* 3.10*  HGB 9.9* 9.5* 9.6*  HCT 30.4* 29.5* 30.6*  MCV 96.2 96.4 98.7  MCH 31.3 31.0 31.0  MCHC 32.6 32.2 31.4  RDW 13.1 13.1 12.8  PLT 259 240 237    BNP Recent Labs  Lab 10/03/18 1643  BNP 42.4     DDimer No results for input(s): DDIMER in the last 168 hours.   Radiology    Vas [place] Upper Extremity Venous Duplex  Result Date: 10/05/2018 UPPER VENOUS STUDY  Indications: Swelling, and right radial artery catheterization 08/07/2018 Limitations: Body habitus and poor ultrasound/tissue interface. Performing Technologist: Maudry Mayhew MHA, RDMS, RVT, RDCS  Examination Guidelines: A complete evaluation includes  B-mode imaging, spectral Doppler, color Doppler, and power Doppler as needed of all accessible portions of each vessel. Bilateral testing is considered an integral part of a complete examination. Limited examinations for reoccurring indications may be performed as noted.  Right Findings: +----------+------------+---------+-----------+----------+-------+ RIGHT     CompressiblePhasicitySpontaneousPropertiesSummary +----------+------------+---------+-----------+----------+-------+ IJV           Full       Yes       Yes                      +----------+------------+---------+-----------+----------+-------+ Subclavian    Full       Yes       Yes                      +----------+------------+---------+-----------+----------+-------+ Axillary      Full       Yes       Yes                      +----------+------------+---------+-----------+----------+-------+ Brachial      Full       Yes       Yes                      +----------+------------+---------+-----------+----------+-------+ Radial        Full                                          +----------+------------+---------+-----------+----------+-------+ Ulnar         Full                                          +----------+------------+---------+-----------+----------+-------+ Cephalic      Full                                          +----------+------------+---------+-----------+----------+-------+ Basilic       Full                                          +----------+------------+---------+-----------+----------+-------+  Left Findings: +----------+------------+---------+-----------+----------+-------+ LEFT      CompressiblePhasicitySpontaneousPropertiesSummary +----------+------------+---------+-----------+----------+-------+ Subclavian               Yes       Yes                      +----------+------------+---------+-----------+----------+-------+  Summary:  Right: No evidence of  deep vein thrombosis in the upper extremity. No evidence of superficial vein thrombosis in the upper extremity.  Left: No evidence of thrombosis in the subclavian.  *See table(s) above for measurements and observations.  Diagnosing physician: Curt Jews MD Electronically signed by Curt Jews MD on 10/05/2018 at 1:48:20 PM.    Final     Cardiac Studies     Patient Profile     73 y.o. male admitted with acute renal insufficiency   Assessment & Plan    1.  Chronic diastolic CHF:    His symptoms were actually due to acute renal insufficiency .  No signs or symptoms of CHF  2.   Acute renal insufficiency:   Nephrology to see today   No additional cardiac recs.      For questions or updates, please contact [place] Please consult [email or web address] for contact info under        Signed, Mertie Moores, MD  10/06/2018, 10:54 AM

## 2018-10-07 LAB — CBC
HCT: 31.5 % — ABNORMAL LOW (ref 39.0–52.0)
Hemoglobin: 10.2 g/dL — ABNORMAL LOW (ref 13.0–17.0)
MCH: 31.1 pg (ref 26.0–34.0)
MCHC: 32.4 g/dL (ref 30.0–36.0)
MCV: 96 fL (ref 80.0–100.0)
Platelets: 244 10*3/uL (ref 150–400)
RBC: 3.28 MIL/uL — ABNORMAL LOW (ref 4.22–5.81)
RDW: 12.8 % (ref 11.5–15.5)
WBC: 10.9 10*3/uL — ABNORMAL HIGH (ref 4.0–10.5)
nRBC: 0 % (ref 0.0–0.2)

## 2018-10-07 LAB — RENAL FUNCTION PANEL
Albumin: 2.9 g/dL — ABNORMAL LOW (ref 3.5–5.0)
Anion gap: 11 (ref 5–15)
BUN: 27 mg/dL — ABNORMAL HIGH (ref 8–23)
CO2: 26 mmol/L (ref 22–32)
Calcium: 8.6 mg/dL — ABNORMAL LOW (ref 8.9–10.3)
Chloride: 104 mmol/L (ref 98–111)
Creatinine, Ser: 2.35 mg/dL — ABNORMAL HIGH (ref 0.61–1.24)
GFR calc Af Amer: 31 mL/min — ABNORMAL LOW (ref >60)
GFR calc non Af Amer: 27 mL/min — ABNORMAL LOW (ref >60)
Glucose, Bld: 197 mg/dL — ABNORMAL HIGH (ref 70–99)
Phosphorus: 2.6 mg/dL (ref 2.5–4.6)
Potassium: 3.5 mmol/L (ref 3.5–5.1)
Sodium: 141 mmol/L (ref 135–145)

## 2018-10-07 LAB — GLUCOSE, CAPILLARY
Glucose-Capillary: 156 mg/dL — ABNORMAL HIGH (ref 70–99)
Glucose-Capillary: 271 mg/dL — ABNORMAL HIGH (ref 70–99)

## 2018-10-07 MED ORDER — FUROSEMIDE 80 MG PO TABS: 80.0000 mg | ORAL_TABLET | Freq: Every day | ORAL | 0 refills | Status: DC

## 2018-10-07 MED ORDER — ADULT MULTIVITAMIN W/MINERALS CH: 1.0000 | ORAL_TABLET | Freq: Every day | ORAL | Status: DC

## 2018-10-07 MED ORDER — INSULIN ASPART PROT & ASPART (70-30 MIX) 100 UNIT/ML ~~LOC~~ SUSP: 30.0000 [IU] | Freq: Two times a day (BID) | SUBCUTANEOUS | Status: DC

## 2018-10-07 NOTE — Discharge Summary (Signed)
Physician Discharge Summary  [name]:[id number] DOB: 1945/04/17  PCP: Shirline Frees, MD  Admitted from: Home Discharged to: Home  Admit date: 10/03/2018 Discharge date: 10/07/2018  Recommendations for Outpatient Follow-up:    PCP to please follow outstanding labs (serum protein electrophoresis and kappa/lambda light chain work-up) sent from the hospital on 10/06/2018.  Follow-up Information    Shirline Frees, MD. Schedule an appointment as soon as possible for a visit in 5 day(s).   Specialty: Family Medicine Why: To be seen with repeat labs (CBC & BMP).  Please take all your medications for review by MD during this visit. Contact information: [place] [address] [phone]        Jerline Pain, MD. Schedule an appointment as soon as possible for a visit.   Specialty: Cardiology Contact information: [address] [place] [address] [phone]            Home Health: None Equipment/Devices: None  Discharge Condition: Improved and stable CODE STATUS: Full Diet recommendation: Heart healthy & diabetic diet.  Discharge Diagnoses:  Active Problems:   Sarcoidosis   Diabetes mellitus without complication (HCC)   Coronary artery disease   GERD   Sleep apnea   Coronary atherosclerosis of native coronary artery   Chronic diastolic heart failure (HCC)   Pulmonary sarcoidosis (HCC)   Uncontrolled diabetes mellitus (HCC)   Hyperlipidemia   CKD (chronic kidney disease), stage III (HCC)   Iron deficiency anemia   AKI (acute kidney injury) (HCC)   Hypoalbuminemia   Hypokalemia   Hypothyroidism   Acute kidney injury superimposed on chronic kidney disease [facility])   Brief Summary: 73 year old married male with PMH of CAD s/p stents x2 early July 5974, chronic diastolic CHF, DM 2, hypothyroid, HTN, HLD, CKD 3, OSA on CPAP, sarcoidosis and adrenal insufficiency on chronic prednisone, chronic leg edema, did relatively  well for a week post stenting and then noted progressive dyspnea on exertion, fatigue, chest discomfort, worsening leg edema, nausea and right upper extremity pain.  Admitted for acute on chronic kidney disease.  Cardiology and nephrology consulted.   Assessment & Plan:  Acute kidney injury complicating stage III chronic kidney disease  Baseline creatinine probably in the 1.4-1.5 range.  Creatinine on 6/2: 1.41.  Admitted with creatinine of 3.24.  Acute kidney injury likely due to dehydration from poor oral intake, ongoing high-dose diuretic use and ARB.  ARB discontinued for now.  Lasix temporarily held and reinitiated.  Renal ultrasound 7/29 and 8/1 without hydronephrosis.  His symptoms of generalized weakness and nausea may be related to this and are much better.  Despite almost 48 hours of IV fluid hydration, reports urine output lower than normal and creatinine has only slowly improved from 3.24 to 2.5 and developing features of volume overload/lower extremity edema.  Nephrology thereby consulted, input appreciated, suspect AKI hemodynamically mediated as noted above complicating chronic kidney disease from DM and HTN.  Started Lasix 80 mg daily yesterday.    Nephrology follow-up on day of discharge appreciated.  Suspect acute kidney injury hemodynamically mediated, has gradual improvement of renal function with creatinine down to 2.35 on day of discharge and excellent urine output.  Dr. Posey Pronto has cleared him for discharge on furosemide 80 mg daily, will need repeat labs at PCPs office next week, restart ARB when creatinine back to his baseline 1.4-1.5 as outpatient and refer back to nephrology if needed.  Dyspnea on exertion, on admission  Unclear  etiology.  CT chest without contrast shows no acute cardiopulmonary disease.  May be related to some uremic symptoms from acute kidney injury, obesity/OSA and generalized deconditioning.  Resolved.  Uncontrolled DM2 with  hyperglycemia and renal complications  I7O >67  Multifactorial related to chronic prednisone, dietary noncompliance and suspect poor compliance with medications as well.  In the hospital patient was treated with Lantus, mealtime and sliding scale NovoLog with adjusting doses.  He states that Lantus does not usually work for him him from past experience.  At home he uses 70/30 insulin which most times he takes at 30 units twice daily.  He however seems to adjust this based on some sliding scale.  He was advised to take it scheduled at 30 units twice daily, maintain strict CBG logs and follow-up with PCP for adjustment of doses as needed.  Also counseled him and his wife at bedside extensively regarding importance of compliance with all aspects of medical care and they verbalized understanding.  Hyperlipidemia  Continue statins.  CAD s/p DES x2  Cardiology input appreciated.    No typical angina symptoms and clinical picture not consistent with ACS.  Continue aspirin, Plavix, Crestor  States that he has been having right upper extremity pain since cardiac cath but no acute findings on exam  Chronic diastolic CHF  TTE 6/72/0947: LVEF 60-65%.  Continue Lasix 80 mg daily.  Anemia in CKD/iron deficiency  Stable.  No bleeding reported. Received IV iron by nephrology yesterday..  Follow CBC as outpatient.  Hypokalemia/hypomagnesemia Replaced.  Obesity Estimated body mass index is 33.91 kg/m as calculated from the following:   Height as of this encounter: 5\' 10"  (1.778 m).   Weight as of this encounter: 107.2 kg.   Nutritional status Nutrition Problem: Inadequate oral intake Etiology: nausea Signs/Symptoms: meal completion < 50%, energy intake < 75% for > 7 days, per patient/family report Interventions: Boost Breeze, MVI   Sarcoidosis  CT chest without contrast without acute cardiopulmonary findings.  Continue chronic prednisone 10 mg daily.  GERD Continue PPI.   Patient reports that he was mostly taking Protonix at home although he has rabeprazole as well.  Patient has some duplication of medications including PPI, vitamin D etc.  Advised him to take all his medications to PCP visit for review.  Discontinued rabeprazole.  OSA on CPAP Continue CPAP.  Hypothyroid Continue Synthroid.  Right upper extremity swelling  Reports since cardiac cath.  RUE venous Doppler without DVT.  No clinical features suggestive of cellulitis or other acute findings.  Elevate limb and monitor.  If persists, consider outpatient orthopedic consultation.  Patient states that he usually follows up with [facility].  X-rays of right upper extremity without acute findings.   Consultants:  Cardiology Nephrology  Procedures:  None  Discharge Instructions  Discharge Instructions    (HEART FAILURE PATIENTS) Call MD:  Anytime you have any of the following symptoms: 1) 3 pound weight gain in 24 hours or 5 pounds in 1 week 2) shortness of breath, with or without a dry hacking cough 3) swelling in the hands, feet or stomach 4) if you have to sleep on extra pillows at night in order to breathe.   Complete by: As directed    Call MD for:  difficulty breathing, headache or visual disturbances   Complete by: As directed    Call MD for:  extreme fatigue   Complete by: As directed    Call MD for:  persistant dizziness or light-headedness   Complete  by: As directed    Call MD for:  persistant nausea and vomiting   Complete by: As directed    Call MD for:  severe uncontrolled pain   Complete by: As directed    Call MD for:  temperature >100.4   Complete by: As directed    Diet - low sodium heart healthy   Complete by: As directed    Diet Carb Modified   Complete by: As directed    Increase activity slowly   Complete by: As directed        Medication List    STOP taking these medications   losartan 100 MG tablet Commonly known as: COZAAR    potassium chloride 10 MEQ tablet Commonly known as: Klor-Con M10   RABEprazole 20 MG tablet Commonly known as: ACIPHEX     TAKE these medications   acetaminophen 500 MG tablet Commonly known as: TYLENOL Take 500 mg by mouth daily as needed for moderate pain or headache.   Albuterol Sulfate 108 (90 Base) MCG/ACT Aepb Commonly known as: ProAir RespiClick Inhale 2 puffs into the lungs every 6 (six) hours as needed.   albuterol (2.5 MG/3ML) 0.083% nebulizer solution Commonly known as: PROVENTIL Take 3 mLs (2.5 mg total) by nebulization every 6 (six) hours as needed for wheezing or shortness of breath. Dx Code D86.0   aspirin EC 81 MG tablet Take 81 mg by mouth every evening.   clopidogrel 75 MG tablet Commonly known as: PLAVIX Take 1 tablet (75 mg total) by mouth daily.   CoQ-10 400 MG Caps Take 400 mg by mouth every evening.   fish oil-omega-3 fatty acids 1000 MG capsule Take 1 g by mouth daily.   furosemide 80 MG tablet Commonly known as: LASIX Take 1 tablet (80 mg total) by mouth daily. What changed:   when to take this  additional instructions   glipiZIDE 10 MG tablet Commonly known as: GLUCOTROL Take 10 mg by mouth every evening.   insulin aspart protamine- aspart (70-30) 100 UNIT/ML injection Commonly known as: NOVOLOG MIX 70/30 Inject 0.3 mLs (30 Units total) into the skin 2 (two) times daily with a meal. What changed:   how much to take  when to take this  reasons to take this   levothyroxine 50 MCG tablet Commonly known as: SYNTHROID Take 50 mcg by mouth daily.   montelukast 10 MG tablet Commonly known as: SINGULAIR Take 10 mg by mouth daily as needed (allergies).   multivitamin with minerals Tabs tablet Take 1 tablet by mouth daily. Start taking on: October 08, 2018   pantoprazole 40 MG tablet Commonly known as: PROTONIX Take 40 mg by mouth daily.   predniSONE 10 MG tablet Commonly known as: DELTASONE Take 1 tablet (10 mg total) by  mouth daily with breakfast. T   rosuvastatin 10 MG tablet Commonly known as: Crestor Take 1 tablet (10 mg total) by mouth at bedtime.   sodium chloride 0.65 % Soln nasal spray Commonly known as: OCEAN Place 1 spray into both nostrils as needed for congestion.   Vitamin D (Ergocalciferol) 1.25 MG (50000 UT) Caps capsule Commonly known as: DRISDOL Take 50,000 Units by mouth every Sunday.   Vitamin D3 75 MCG (3000 UT) Tabs Take 3,000 Units by mouth daily.      Allergies  Allergen Reactions  . Hydrocortisone Nausea Only  . Metolazone     Drained, no energy  . Statins Other (See Comments)    Pt says they make him "mean"  Procedures/Studies: Dg Chest 2 View  Result Date: 10/03/2018 CLINICAL DATA:  Chest pain for 4 weeks, coronary stent put in 4 weeks ago, diabetes mellitus, coronary artery disease post MI, hypertension, sarcoid EXAM: CHEST - 2 VIEW COMPARISON:  06/15/2016, 06/25/2015 Correlation: CT chest 02/20/2014 FINDINGS: Enlargement of cardiac silhouette. Blunting of the RIGHT costophrenic angle by large fat pad on prior CT. Mediastinal contours and pulmonary vascularity otherwise normal. Chronic interstitial prominence in the lower lungs bilaterally, greater on RIGHT, question related to chronic interstitial lung disease from sarcoidosis, unchanged since 06/25/2015. No definite acute infiltrate, pleural effusion or pneumothorax. Osseous structures unremarkable. IMPRESSION: Chronic basilar interstitial disease greater on RIGHT. Mild enlargement of cardiac silhouette with prominent RIGHT epicardial fat pad. No acute abnormalities. Electronically Signed   By: Lavonia Dana M.D.   On: 10/03/2018 15:14   Dg Shoulder Right  Result Date: 10/06/2018 CLINICAL DATA:  Right arm pain and swelling. EXAM: RIGHT SHOULDER - 2+ VIEW COMPARISON:  None. FINDINGS: There is advanced acromioclavicular osteoarthritis. Mild glenohumeral joint osteoarthritis noted. No fractures identified. No radiopaque  foreign bodies. IMPRESSION: 1. No acute abnormality. 2. AC joint and glenohumeral joint osteoarthritis. Electronically Signed   By: Kerby Moors M.D.   On: 10/06/2018 17:15   Dg Elbow Complete Right (3+view)  Result Date: 10/06/2018 CLINICAL DATA:  Right arm pain.  Swelling to distal elbow. EXAM: RIGHT ELBOW - COMPLETE 3+ VIEW COMPARISON:  None. FINDINGS: There is no evidence of fracture, dislocation, or joint effusion. There is no evidence of arthropathy or other focal bone abnormality. Diffuse soft tissue swelling/edema is identified. IMPRESSION: 1. No acute bone abnormality. 2. Soft tissue swelling. Electronically Signed   By: Kerby Moors M.D.   On: 10/06/2018 17:14   Ct Chest Wo Contrast  Result Date: 10/03/2018 CLINICAL DATA:  Sarcoidosis EXAM: CT CHEST WITHOUT CONTRAST TECHNIQUE: Multidetector CT imaging of the chest was performed following the standard protocol without IV contrast. COMPARISON:  Chest radiographs dated 10/03/2018. CT chest dated 02/20/2014. FINDINGS: Cardiovascular: The heart is normal in size. No pericardial effusion. No evidence of thoracic aortic aneurysm. Mild atherosclerotic calcifications of the aortic arch. Coronary atherosclerosis with LAD stent. Mediastinum/Nodes: No suspicious mediastinal lymphadenopathy. Visualized thyroid is unremarkable. Lungs/Pleura: 7 mm subpleural nodule in the medial right lower lobe (series 8/image 37), unchanged from 2015, benign. Additional triangular subpleural nodule in the right lower lobe (series 8/image 96), unchanged, benign. Additional subpleural nodule in the anterior right upper lobe (series 8/image 72), unchanged, benign. Scarring in the right middle lobe, chronic, possibly related to the patient's known sarcoidosis. No focal consolidation. No pleural effusion or pneumothorax. Upper Abdomen: Visualized upper abdomen is notable for hepatic steatosis and prior cholecystectomy. Musculoskeletal: Visualized osseous structures are within  normal limits. IMPRESSION: Right middle lobe scarring, possibly related to the patient's known sarcoidosis. Stable small right lung nodules, unchanged from 2015, benign. Dedicated follow-up imaging is not required per Fleischner Society guidelines. This recommendation follows the consensus statement: Guidelines for Management of Small Pulmonary Nodules Detected on CT Images: From the Fleischner Society 2017; Radiology 2017; 284:228-243. No evidence of acute cardiopulmonary disease. Aortic Atherosclerosis (ICD10-I70.0). Electronically Signed   By: Julian Hy M.D.   On: 10/03/2018 22:17   US Renal  Result Date: 10/06/2018 CLINICAL DATA:  Acute kidney injury. EXAM: RENAL / URINARY TRACT ULTRASOUND COMPLETE COMPARISON:  CT abdomen and pelvis 04/20/2018. Renal ultrasound 10/03/2018. FINDINGS: Right Kidney: Renal measurements: 12.4 x 6.1 x 6.5 cm = volume: 254.2 mL . Echogenicity within normal limits. No  solid mass or hydronephrosis visualized. Two small renal cysts are again seen. Left Kidney: Renal measurements: 13.1 x 5.8 x 5.2 cm = volume: 207.0 mL. Echogenicity within normal limits. No mass or hydronephrosis visualized. Bladder: Appears normal for degree of bladder distention. IMPRESSION: Negative for hydronephrosis.  Negative exam. Electronically Signed   By: Inge Rise M.D.   On: 10/06/2018 12:08   US Renal  Result Date: 10/03/2018 CLINICAL DATA:  Renal failure.  Chronicity not specified. EXAM: RENAL / URINARY TRACT ULTRASOUND COMPLETE COMPARISON:  CT 04/20/2018 FINDINGS: Right Kidney: Renal measurements: 12.4 x 5.1 x 5.7 cm = volume: 188.7 mL. Echogenicity within normal limits. Two small cyst in the right kidney measuring 1.2 and 1.3 cm respectively. No solid mass or hydronephrosis visualized. Left Kidney: Renal measurements: 11.8 x 5.8 x 6.4 cm = volume: 227 mL. Echogenicity within normal limits. No mass or hydronephrosis visualized. Bladder: Appears normal for degree of bladder distention.  IMPRESSION: Unremarkable renal ultrasound.  No obstructive uropathy. Incidental small right renal cysts. Electronically Signed   By: Keith Rake M.D.   On: 10/03/2018 21:23   Dg Humerus Right  Result Date: 10/06/2018 CLINICAL DATA:  Right arm pain. EXAM: RIGHT HUMERUS - 2+ VIEW COMPARISON:  None FINDINGS: There is no evidence of fracture or other focal bone lesions. Advanced acromioclavicular osteoarthritis noted. Mild glenohumeral joint osteoarthritis also noted. Soft tissues are unremarkable. IMPRESSION: 1. No acute findings. 2. Right AC joint and glenohumeral joint osteoarthritis. Electronically Signed   By: Kerby Moors M.D.   On: 10/06/2018 17:12   Vas [place] Upper Extremity Venous Duplex  Result Date: 10/05/2018 UPPER VENOUS STUDY  Indications: Swelling, and right radial artery catheterization 08/07/2018 Limitations: Body habitus and poor ultrasound/tissue interface. Performing Technologist: Maudry Mayhew MHA, RDMS, RVT, RDCS  Examination Guidelines: A complete evaluation includes B-mode imaging, spectral Doppler, color Doppler, and power Doppler as needed of all accessible portions of each vessel. Bilateral testing is considered an integral part of a complete examination. Limited examinations for reoccurring indications may be performed as noted.  Right Findings: +----------+------------+---------+-----------+----------+-------+ RIGHT     CompressiblePhasicitySpontaneousPropertiesSummary +----------+------------+---------+-----------+----------+-------+ IJV           Full       Yes       Yes                      +----------+------------+---------+-----------+----------+-------+ Subclavian    Full       Yes       Yes                      +----------+------------+---------+-----------+----------+-------+ Axillary      Full       Yes       Yes                      +----------+------------+---------+-----------+----------+-------+ Brachial      Full       Yes       Yes                       +----------+------------+---------+-----------+----------+-------+ Radial        Full                                          +----------+------------+---------+-----------+----------+-------+ Ulnar         Full                                          +----------+------------+---------+-----------+----------+-------+  Cephalic      Full                                          +----------+------------+---------+-----------+----------+-------+ Basilic       Full                                          +----------+------------+---------+-----------+----------+-------+  Left Findings: +----------+------------+---------+-----------+----------+-------+ LEFT      CompressiblePhasicitySpontaneousPropertiesSummary +----------+------------+---------+-----------+----------+-------+ Subclavian               Yes       Yes                      +----------+------------+---------+-----------+----------+-------+  Summary:  Right: No evidence of deep vein thrombosis in the upper extremity. No evidence of superficial vein thrombosis in the upper extremity.  Left: No evidence of thrombosis in the subclavian.  *See table(s) above for measurements and observations.  Diagnosing physician: Curt Jews MD Electronically signed by Curt Jews MD on 10/05/2018 at 1:48:20 PM.    Final       Subjective: Patient reports that he feels "fine" and is anxious to go home.  States that he and his wife took a couple of laps on the floor yesterday without dyspnea on exertion, chest pain, dizziness or lightheadedness.  Had mild nausea yesterday which has since resolved without abdominal pain or vomiting.  Denies any other complaints.  Nephrology has been by and told him that he can go home today.  Discharge Exam:  Vitals:   10/06/18 1742 10/06/18 1931 10/07/18 0411 10/07/18 0926  BP: (!) 149/73 (!) 150/73 (!) 145/68 (!) 142/70  Pulse: 60 (!) 55 (!) 58 67  Resp: 18 18 18     Temp: 98.4 F (36.9 C) 98.8 F (37.1 C) 98.7 F (37.1 C)   TempSrc: Oral Oral Oral   SpO2: 97% 97% 99%   Weight:   105.5 kg   Height:        General exam: Pleasant elderly male, moderately built and obese  sitting up comfortably in chair this morning.  Oral mucosa moist. Respiratory system: Clear to auscultation.  No increased work of breathing. Cardiovascular system: S1 and S2 heard, RRR.  No JVD, murmurs.  Trace bilateral leg edema, better.  Telemetry personally reviewed: SB in the 50s-SR. Gastrointestinal system: Abdomen is nondistended, soft and nontender. No organomegaly or masses felt. Normal bowel sounds heard. Central nervous system: Alert and oriented. No focal neurological deficits. Extremities: Symmetric 5 x 5 power.  Right upper extremity with very minimal diffuse swelling compared to left but improved compared to yesterday and may be almost resolved, no redness, increased warmth or tenderness.  Good radial pulses felt.  No acute findings to explain etiology of his right upper extremity pain. Skin: No rashes, lesions or ulcers Psychiatry: Judgement and insight appear normal. Mood & affect appropriate.     The results of significant diagnostics from this hospitalization (including imaging, microbiology, ancillary and laboratory) are listed below for reference.     Microbiology: Recent Results (from the past 240 hour(s))  SARS Coronavirus 2 (CEPHEID - Performed in [place] hospital lab), Hosp Order     Status: None   Collection Time: 10/03/18  4:50 PM   Specimen: Urine, Clean Catch; Nasopharyngeal  Result Value Ref Range Status   SARS Coronavirus 2 NEGATIVE NEGATIVE Final    Comment: (NOTE) If result is NEGATIVE SARS-CoV-2 target nucleic acids are NOT DETECTED. The SARS-CoV-2 RNA is generally detectable in upper and lower  respiratory specimens during the acute phase of infection. The lowest  concentration of SARS-CoV-2 viral copies this assay can detect is 250   copies / mL. A negative result does not preclude SARS-CoV-2 infection  and should not be used as the sole basis for treatment or other  patient management decisions.  A negative result may occur with  improper specimen collection / handling, submission of specimen other  than nasopharyngeal swab, presence of viral mutation(s) within the  areas targeted by this assay, and inadequate number of viral copies  (<250 copies / mL). A negative result must be combined with clinical  observations, patient history, and epidemiological information. If result is POSITIVE SARS-CoV-2 target nucleic acids are DETECTED. The SARS-CoV-2 RNA is generally detectable in upper and lower  respiratory specimens dur ing the acute phase of infection.  Positive  results are indicative of active infection with SARS-CoV-2.  Clinical  correlation with patient history and other diagnostic information is  necessary to determine patient infection status.  Positive results do  not rule out bacterial infection or co-infection with other viruses. If result is PRESUMPTIVE POSTIVE SARS-CoV-2 nucleic acids MAY BE PRESENT.   A presumptive positive result was obtained on the submitted specimen  and confirmed on repeat testing.  While 2019 novel coronavirus  (SARS-CoV-2) nucleic acids may be present in the submitted sample  additional confirmatory testing may be necessary for epidemiological  and / or clinical management purposes  to differentiate between  SARS-CoV-2 and other Sarbecovirus currently known to infect humans.  If clinically indicated additional testing with an alternate test  methodology [phone]) is advised. The SARS-CoV-2 RNA is generally  detectable in upper and lower respiratory sp ecimens during the acute  phase of infection. The expected result is Negative. Fact Sheet for Patients:  StrictlyIdeas.no Fact Sheet for Healthcare  Providers: BankingDealers.co.za This test is not yet approved or cleared by the [place] FDA and has been authorized for detection and/or diagnosis of SARS-CoV-2 by FDA under an Emergency Use Authorization (EUA).  This EUA will remain in effect (meaning this test can be used) for the duration of the COVID-19 declaration under Section 564(b)(1) of the Act, 21 U.S.C. section 360bbb-3(b)(1), unless the authorization is terminated or revoked sooner. Performed at [facility], [place] [address] [place], [place] [address]      Labs: CBC: Recent Labs  Lab 10/03/18 1923 10/04/18 1856 10/05/18 0557 10/06/18 0537 10/07/18 0418  WBC 13.6* 12.9* 11.4* 11.3* 10.9*  NEUTROABS 10.4*  --   --   --   --   HGB 11.2* 9.9* 9.5* 9.6* 10.2*  HCT 34.7* 30.4* 29.5* 30.6* 31.5*  MCV 97.2 96.2 96.4 98.7 96.0  PLT 278 259 240 237 314   Basic Metabolic Panel: Recent Labs  Lab 10/03/18 1430 10/03/18 1923 10/04/18 0643 10/05/18 0557 10/06/18 0537 10/07/18 0418  NA 138  --  137 137 139 141  K 3.4*  --  3.0* 4.3 3.8 3.5  CL 99  --  101 104 106 104  CO2 21*  --  26 26 26 26   GLUCOSE 241*  --  144* 202* 213* 197*  BUN 41*  --  40* 38* 30* 27*  CREATININE 3.24*  --  3.05* 2.96* 2.51* 2.35*  CALCIUM 8.8*  --  8.5* 8.4* 8.2* 8.6*  MG  --  1.8 1.6*  --  1.7  --   PHOS  --  4.8* 3.8  --   --  2.6   Liver Function Tests: Recent Labs  Lab 10/03/18 1923 10/04/18 0643 10/07/18 0418  AST 14* 13*  --   ALT 16 14  --   ALKPHOS 52 46  --   BILITOT 0.9 0.6  --   PROT 7.0 6.1*  --   ALBUMIN 3.2* 2.9* 2.9*   BNP (last 3 results) Recent Labs    10/03/18 1643  BNP 42.4   Cardiac Enzymes: No results for input(s): CKTOTAL, CKMB, CKMBINDEX, TROPONINI in the last 168 hours. CBG: Recent Labs  Lab 10/06/18 0608 10/06/18 1204 10/06/18 1736 10/06/18 2121 10/07/18 0624  GLUCAP 193* 212* 246* 219* 156*   Urinalysis    Component Value Date/Time   COLORURINE YELLOW  10/06/2018 1324   APPEARANCEUR CLEAR 10/06/2018 1324   LABSPEC 1.010 10/06/2018 1324   PHURINE 6.0 10/06/2018 1324   GLUCOSEU >=500 (A) 10/06/2018 1324   HGBUR NEGATIVE 10/06/2018 1324   BILIRUBINUR NEGATIVE 10/06/2018 1324   KETONESUR NEGATIVE 10/06/2018 1324   PROTEINUR NEGATIVE 10/06/2018 1324   UROBILINOGEN 0.2 11/09/2010 2146   NITRITE NEGATIVE 10/06/2018 1324   LEUKOCYTESUR NEGATIVE 10/06/2018 1324    Discussed in detail with patient spouse at bedside, updated care and answered questions.  Time coordinating discharge: 50 minutes  SIGNED:  Vernell Leep, MD, FACP, [facility]. [facility]  To contact the attending provider between 7A-7P or the covering provider during after hours 7P-7A, please log into the web site [email or web address] and access using universal [place] password for that web site. If you do not have the password, please call the hospital operator.

## 2018-10-07 NOTE — Progress Notes (Addendum)
Patient ID: [name], male   DOB: [birth year], 73 y.o.   MRN: [id number] [facility] Progress Note   Assessment/ Plan:   1.  Acute kidney injury on chronic kidney disease stage III: Likely hemodynamically mediated acute kidney injury with gradual improvement of renal function noted and excellent urine output.  He appears to be stable enough to discharge home on furosemide 80 mg daily and will need labs at PCP office within the next week.  Restart ARB when creatinine is back to his baseline 1.4-1.5 as an outpatient.  Refer him back to nephrology if indicated. 2.  Anemia: With significant iron deficiency noted on labs, on intravenous iron. 3.  Hypertension: Blood pressure marginally elevated, diuretic restarted yesterday.. 4.  Chronic sarcoidosis with relative adrenal insufficiency: Continue on current outpatient prednisone dose. 5.  Diabetes mellitus: Suboptimally controlled with a hemoglobin A1c of 10.2% possibly exacerbated by ongoing prednisone use.  Encourage close follow-up with PCP.  Subjective:   Reports some abdominal pain and nausea overnight-PPI dose adjusted.   Objective:   BP (!) 145/68 (BP Location: Right Arm)   Pulse (!) 58   Temp 98.7 F (37.1 C) (Oral)   Resp 18   Ht 5\' 10"  (1.778 m)   Wt 105.5 kg   SpO2 99%   BMI 33.37 kg/m   Intake/Output Summary (Last 24 hours) at 10/07/2018 0810 Last data filed at 10/07/2018 0600 Gross per 24 hour  Intake 1273.31 ml  Output 3425 ml  Net -2151.69 ml   Weight change: -1.678 kg  Physical Exam: Gen: Comfortably sleeping in bed, awakens to calling out his name CVS: Pulse regular rhythm, normal rate, S1 and S2 normal Resp: Diminished breath sounds over bases-poor inspiratory effort.  No rales/rhonchi Abd: Soft, obese, distended, nontender Ext: 1-2+ left lower extremity edema, trace right lower extremity edema  Imaging: Dg Shoulder Right  Result Date: 10/06/2018 CLINICAL DATA:  Right arm pain and swelling. EXAM: RIGHT  SHOULDER - 2+ VIEW COMPARISON:  None. FINDINGS: There is advanced acromioclavicular osteoarthritis. Mild glenohumeral joint osteoarthritis noted. No fractures identified. No radiopaque foreign bodies. IMPRESSION: 1. No acute abnormality. 2. AC joint and glenohumeral joint osteoarthritis. Electronically Signed   By: Kerby Moors M.D.   On: 10/06/2018 17:15   Dg Elbow Complete Right (3+view)  Result Date: 10/06/2018 CLINICAL DATA:  Right arm pain.  Swelling to distal elbow. EXAM: RIGHT ELBOW - COMPLETE 3+ VIEW COMPARISON:  None. FINDINGS: There is no evidence of fracture, dislocation, or joint effusion. There is no evidence of arthropathy or other focal bone abnormality. Diffuse soft tissue swelling/edema is identified. IMPRESSION: 1. No acute bone abnormality. 2. Soft tissue swelling. Electronically Signed   By: Kerby Moors M.D.   On: 10/06/2018 17:14   US Renal  Result Date: 10/06/2018 CLINICAL DATA:  Acute kidney injury. EXAM: RENAL / URINARY TRACT ULTRASOUND COMPLETE COMPARISON:  CT abdomen and pelvis 04/20/2018. Renal ultrasound 10/03/2018. FINDINGS: Right Kidney: Renal measurements: 12.4 x 6.1 x 6.5 cm = volume: 254.2 mL . Echogenicity within normal limits. No solid mass or hydronephrosis visualized. Two small renal cysts are again seen. Left Kidney: Renal measurements: 13.1 x 5.8 x 5.2 cm = volume: 207.0 mL. Echogenicity within normal limits. No mass or hydronephrosis visualized. Bladder: Appears normal for degree of bladder distention. IMPRESSION: Negative for hydronephrosis.  Negative exam. Electronically Signed   By: Inge Rise M.D.   On: 10/06/2018 12:08   Dg Humerus Right  Result Date: 10/06/2018 CLINICAL DATA:  Right  arm pain. EXAM: RIGHT HUMERUS - 2+ VIEW COMPARISON:  None FINDINGS: There is no evidence of fracture or other focal bone lesions. Advanced acromioclavicular osteoarthritis noted. Mild glenohumeral joint osteoarthritis also noted. Soft tissues are unremarkable. IMPRESSION:  1. No acute findings. 2. Right AC joint and glenohumeral joint osteoarthritis. Electronically Signed   By: Kerby Moors M.D.   On: 10/06/2018 17:12   Vas [place] Upper Extremity Venous Duplex  Result Date: 10/05/2018 UPPER VENOUS STUDY  Indications: Swelling, and right radial artery catheterization 08/07/2018 Limitations: Body habitus and poor ultrasound/tissue interface. Performing Technologist: Maudry Mayhew MHA, RDMS, RVT, RDCS  Examination Guidelines: A complete evaluation includes B-mode imaging, spectral Doppler, color Doppler, and power Doppler as needed of all accessible portions of each vessel. Bilateral testing is considered an integral part of a complete examination. Limited examinations for reoccurring indications may be performed as noted.  Right Findings: +----------+------------+---------+-----------+----------+-------+ RIGHT     CompressiblePhasicitySpontaneousPropertiesSummary +----------+------------+---------+-----------+----------+-------+ IJV           Full       Yes       Yes                      +----------+------------+---------+-----------+----------+-------+ Subclavian    Full       Yes       Yes                      +----------+------------+---------+-----------+----------+-------+ Axillary      Full       Yes       Yes                      +----------+------------+---------+-----------+----------+-------+ Brachial      Full       Yes       Yes                      +----------+------------+---------+-----------+----------+-------+ Radial        Full                                          +----------+------------+---------+-----------+----------+-------+ Ulnar         Full                                          +----------+------------+---------+-----------+----------+-------+ Cephalic      Full                                          +----------+------------+---------+-----------+----------+-------+ Basilic       Full                                           +----------+------------+---------+-----------+----------+-------+  Left Findings: +----------+------------+---------+-----------+----------+-------+ LEFT      CompressiblePhasicitySpontaneousPropertiesSummary +----------+------------+---------+-----------+----------+-------+ Subclavian               Yes       Yes                      +----------+------------+---------+-----------+----------+-------+  Summary:  Right:  No evidence of deep vein thrombosis in the upper extremity. No evidence of superficial vein thrombosis in the upper extremity.  Left: No evidence of thrombosis in the subclavian.  *See table(s) above for measurements and observations.  Diagnosing physician: Curt Jews MD Electronically signed by Curt Jews MD on 10/05/2018 at 1:48:20 PM.    Final     Labs: BMET Recent Labs  Lab 10/03/18 1430 10/03/18 1923 10/04/18 2725 10/05/18 0557 10/06/18 0537 10/07/18 0418  NA 138  --  137 137 139 141  K 3.4*  --  3.0* 4.3 3.8 3.5  CL 99  --  101 104 106 104  CO2 21*  --  26 26 26 26   GLUCOSE 241*  --  144* 202* 213* 197*  BUN 41*  --  40* 38* 30* 27*  CREATININE 3.24*  --  3.05* 2.96* 2.51* 2.35*  CALCIUM 8.8*  --  8.5* 8.4* 8.2* 8.6*  PHOS  --  4.8* 3.8  --   --  2.6   CBC Recent Labs  Lab 10/03/18 1923 10/04/18 0643 10/05/18 0557 10/06/18 0537 10/07/18 0418  WBC 13.6* 12.9* 11.4* 11.3* 10.9*  NEUTROABS 10.4*  --   --   --   --   HGB 11.2* 9.9* 9.5* 9.6* 10.2*  HCT 34.7* 30.4* 29.5* 30.6* 31.5*  MCV 97.2 96.2 96.4 98.7 96.0  PLT 278 259 240 237 244    Medications:    . aspirin EC  81 mg Oral QPM  . clopidogrel  75 mg Oral Daily  . enoxaparin (LOVENOX) injection  40 mg Subcutaneous Q24H  . furosemide  80 mg Oral Daily  . insulin aspart  0-5 Units Subcutaneous QHS  . insulin aspart  0-9 Units Subcutaneous TID WC  . insulin aspart  6 Units Subcutaneous TID WC  . insulin glargine  25 Units Subcutaneous QHS  .  levothyroxine  50 mcg Oral Q0600  . multivitamin with minerals  1 tablet Oral Daily  . pantoprazole  40 mg Oral BID  . predniSONE  10 mg Oral Q breakfast  . rosuvastatin  10 mg Oral QHS   Elmarie Shiley, MD 10/07/2018, 8:10 AM

## 2018-10-07 NOTE — Discharge Instructions (Signed)

## 2018-10-07 NOTE — Progress Notes (Signed)
Pt IV removed, catheter intact and telemetry removed  Pt has all belongings Pt discharged education provided at bedside with pt wife  Pt refused wheelchair and ambulated off unit with staff

## 2018-10-08 LAB — KAPPA/LAMBDA LIGHT CHAINS
Kappa free light chain: 64.9 mg/L — ABNORMAL HIGH (ref 3.3–19.4)
Kappa, lambda light chain ratio: 1.31 (ref 0.26–1.65)
Lambda free light chains: 49.4 mg/L — ABNORMAL HIGH (ref 5.7–26.3)

## 2018-10-09 LAB — PROTEIN ELECTROPHORESIS, SERUM
A/G Ratio: 1 (ref 0.7–1.7)
Albumin ELP: 3.1 g/dL (ref 2.9–4.4)
Alpha-1-Globulin: 0.3 g/dL (ref 0.0–0.4)
Alpha-2-Globulin: 1.1 g/dL — ABNORMAL HIGH (ref 0.4–1.0)
Beta Globulin: 1 g/dL (ref 0.7–1.3)
Gamma Globulin: 0.9 g/dL (ref 0.4–1.8)
Globulin, Total: 3.2 g/dL (ref 2.2–3.9)
Total Protein ELP: 6.3 g/dL (ref 6.0–8.5)

## 2018-10-10 ENCOUNTER — Encounter: Payer: Self-pay | Admitting: Family

## 2018-10-10 ENCOUNTER — Ambulatory Visit (INDEPENDENT_AMBULATORY_CARE_PROVIDER_SITE_OTHER): Payer: Medicare HMO | Admitting: Family

## 2018-10-10 VITALS — Ht 70.0 in | Wt 232.6 lb

## 2018-10-10 DIAGNOSIS — M25521 Pain in right elbow: Secondary | ICD-10-CM | POA: Diagnosis not present

## 2018-10-10 DIAGNOSIS — M25421 Effusion, right elbow: Secondary | ICD-10-CM | POA: Diagnosis not present

## 2018-10-10 MED ORDER — DOXYCYCLINE HYCLATE 100 MG PO TABS: 100.0000 mg | ORAL_TABLET | Freq: Two times a day (BID) | ORAL | 0 refills | Status: DC

## 2018-10-10 NOTE — Progress Notes (Signed)
Office Visit Note   Patient: [name]           Date of Birth: April 19, 1945           MRN: [id number] Visit Date: 10/10/2018              Requested by: Shirline Frees, MD [place],   [id number] PCP: Shirline Frees, MD  Chief Complaint  Patient presents with  . Right Shoulder - Pain, Edema  . Right Elbow - Pain, Follow-up      HPI: The patient is a 73 year old gentleman seen today for evaluation of pain and swelling to right elbow. This has been ongoing for many weeks now. Patient relates noticed after his cardiac cath which was on 08/07/18. Had a hospital stay for CKD on 7/29 during which a doppler was performed - neg for DVT in RUE. Did have radiographs of shoulder, elbow and humerus - these were negative. No effusion. During the hospitalization it was felt he did not show signs of cellulitis.   Has continued to have painful swelling, mild erythema and warmth. Denies fevers or chills.  No history of gout. Does not eat red meat.  Is status post stents x 2 for CAD on 08/07/18  Assessment & Plan: Visit Diagnoses:  1. Pain in right elbow   2. Swelling of joint of upper arm, right     Plan: will call in doxycycline. Will have him follow up in office Friday for clinical re evaluation. Consider aspiration at that time if no improvement.   Follow-Up Instructions: Return in about 2 days (around 10/12/2018).   Right Elbow Exam   Range of Motion  The patient has normal right elbow ROM.  Muscle Strength  The patient has normal right elbow strength.  Other  Erythema: present Sensation: normal Pulse: present  Comments:  Pitting edema to from forearm to upper arm. Most edema over olecranon, this is not fluctuant. Induration to elbow      Patient is alert, oriented, no adenopathy, well-dressed, normal affect, normal respiratory effort.   Imaging: No results found.   Labs: Lab Results  Component Value Date   HGBA1C 10.2 (H) 10/04/2018   HGBA1C (H) 08/15/2008    12.2 (NOTE) The ADA recommends the following therapeutic goal for glycemic control related to Hgb A1c measurement: Goal of therapy: <6.5 Hgb A1c  Reference: American Diabetes Association: Clinical Practice Recommendations 2010, Diabetes Care, 2010, 33: (Suppl  1).   HGBA1C (H) 11/27/2007    9.5 (NOTE)   The ADA recommends the following therapeutic goal for glycemic   control related to Hgb A1C measurement:   Goal of Therapy:   < 7.0% Hgb A1C   Reference: American Diabetes Association: Clinical Practice   Recommendations 2008, Diabetes Care,  2008, 31:(Suppl 1).   REPTSTATUS 11/10/2010 FINAL 11/09/2010   CULT NO GROWTH 11/09/2010     Lab Results  Component Value Date   ALBUMIN 2.9 (L) 10/07/2018   ALBUMIN 2.9 (L) 10/04/2018   ALBUMIN 3.2 (L) 10/03/2018   PREALBUMIN 21.9 10/04/2018    Lab Results  Component Value Date   MG 1.7 10/06/2018   MG 1.6 (L) 10/04/2018   MG 1.8 10/03/2018   No results found for: VD25OH  Lab Results  Component Value Date   PREALBUMIN 21.9 10/04/2018   CBC EXTENDED Latest Ref Rng & Units 10/07/2018 10/06/2018 10/05/2018  WBC 4.0 - 10.5 K/uL 10.9(H) 11.3(H) 11.4(H)  RBC 4.22 - 5.81  MIL/uL 3.28(L) 3.10(L) 3.06(L)  HGB 13.0 - 17.0 g/dL 10.2(L) 9.6(L) 9.5(L)  HCT 39.0 - 52.0 % 31.5(L) 30.6(L) 29.5(L)  PLT 150 - 400 K/uL 244 237 240  NEUTROABS 1.7 - 7.7 K/uL - - -  LYMPHSABS 0.7 - 4.0 K/uL - - -     Body mass index is 33.37 kg/m.  Orders:  No orders of the defined types were placed in this encounter.  Meds ordered this encounter  Medications  . doxycycline (VIBRA-TABS) 100 MG tablet    Sig: Take 1 tablet (100 mg total) by mouth 2 (two) times daily.    Dispense:  28 tablet    Refill:  0     Procedures: No procedures performed  Clinical Data: No additional findings.  ROS:  All other systems negative, except as noted in the HPI. Review of Systems  Objective: Vital Signs: Ht 5\' 10"  (1.778 m)   Wt 232 lb 9.6 oz  (105.5 kg)   BMI 33.37 kg/m   Specialty Comments:  No specialty comments available.  PMFS History: Patient Active Problem List   Diagnosis Date Noted  . Acute kidney injury superimposed on chronic kidney disease ([place]) 10/04/2018  . AKI (acute kidney injury) ([place]) 10/03/2018  . Hypoalbuminemia 10/03/2018  . Hypokalemia 10/03/2018  . Hypothyroidism 10/03/2018  . CKD (chronic kidney disease), stage III ([place])   . Iron deficiency anemia   . Symptomatic anemia 06/29/2016  . Chronic diastolic heart failure ([place]) 01/14/2014  . Stable angina ([place]) 01/14/2014  . Obesity 01/14/2014  . Pulmonary sarcoidosis ([place]) 01/14/2014  . Uncontrolled diabetes mellitus ([place]) 01/14/2014  . Hyperlipidemia 01/14/2014  . Coronary atherosclerosis of native coronary artery   . Sinusitis, chronic 05/30/2011  . Sleep apnea 10/09/2008  . ADRENAL INSUFFICIENCY, HX OF 02/21/2008  . Coronary artery disease 11/20/2007  . Sarcoidosis 02/20/2007  . Diabetes mellitus without complication ([place]) 23/53/6144  . GERD 12/20/2006   Past Medical History:  Diagnosis Date  . Adrenal insufficiency ([place])   . Allergy   . Anemia   . Arthritis    feet   . CKD (chronic kidney disease), stage III ([place])   . Coronary artery disease    has stents  . Coronary atherosclerosis of native coronary artery    Proximal LAD, posterior lateral stent widely patent-10/12/11  . Diabetes mellitus    insulin and pills  . Hearing loss    wears hearing aids  . Heart attack ([place]) 2010  . History of blood transfusion 06/30/2016   [facility] - 2 units transfused  . Hypertension   . OSA (obstructive sleep apnea)    uses VPAC sleep study 2 years done through [facility]. Dr. Marlou Porch arranged study  . Pneumonia    3-4 years ago  . Sarcoid   . Sleep apnea    uses CPAP nightly  . Thyroid disease   . Type 2 diabetes mellitus (HCC)     Family History  Problem Relation Age of Onset  . Kidney disease Mother   . Diabetes Mother   . Kidney cancer  Mother   . Heart attack Father   . Asthma Sister   . Anesthesia problems Sister        "Kidney's did not wake up"  . Colon cancer Neg Hx   . Colon polyps Neg Hx   . Rectal cancer Neg Hx   . Stomach cancer Neg Hx     Past Surgical History:  Procedure Laterality Date  . APPENDECTOMY    .  CATARACT EXTRACTION  2011   bilat  . CHOLECYSTECTOMY  01/25/2011   Procedure: LAPAROSCOPIC CHOLECYSTECTOMY WITH INTRAOPERATIVE CHOLANGIOGRAM;  Surgeon: Judieth Keens, DO;  Location: [facility];  Service: General;  Laterality: N/A;  . COLONOSCOPY     several  . CORONARY ANGIOPLASTY     most recent 11/2009  . CORONARY STENT INTERVENTION N/A 08/07/2018   Procedure: CORONARY STENT INTERVENTION;  Surgeon: Jettie Booze, MD;  Location: [facility];  Service: Cardiovascular;  Laterality: N/A;  . CORONARY STENT PLACEMENT  2009   in LAD and side branch PTCA  . INTERCOSTAL NERVE BLOCK  2011, 06/2016   x2. lumbar spine  . LEFT HEART CATHETERIZATION WITH CORONARY ANGIOGRAM Bilateral 10/12/2011   Procedure: LEFT HEART CATHETERIZATION WITH CORONARY ANGIOGRAM;  Surgeon: Candee Furbish, MD;  Location: [facility] LAB;  Service: Cardiovascular;  Laterality: Bilateral;  . LEFT HEART CATHETERIZATION WITH CORONARY ANGIOGRAM N/A 04/01/2014   Procedure: LEFT HEART CATHETERIZATION WITH CORONARY ANGIOGRAM;  Surgeon: Candee Furbish, MD;  Location: [facility] C[facility] LAB;  Service: Cardiovascular;  Laterality: N/A;  . RIGHT/LEFT HEART CATH AND CORONARY ANGIOGRAPHY N/A 08/07/2018   Procedure: RIGHT/LEFT HEART CATH AND CORONARY ANGIOGRAPHY;  Surgeon: Jettie Booze, MD;  Location: [facility];  Service: Cardiovascular;  Laterality: N/A;   Social History   Occupational History  . 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 Smoking status: Never Smoker  . Smokeless tobacco: Never Used  Substance and Sexual Activity  . Alcohol use: No  . Drug use:  No  . Sexual activity: Not on file

## 2018-10-12 ENCOUNTER — Ambulatory Visit (INDEPENDENT_AMBULATORY_CARE_PROVIDER_SITE_OTHER): Payer: Medicare HMO | Admitting: Family Medicine

## 2018-10-12 ENCOUNTER — Encounter: Payer: Self-pay | Admitting: Family Medicine

## 2018-10-12 DIAGNOSIS — M25521 Pain in right elbow: Secondary | ICD-10-CM

## 2018-10-12 MED ORDER — COLCHICINE 0.6 MG PO TABS: 0.6000 mg | ORAL_TABLET | Freq: Two times a day (BID) | ORAL | 1 refills | Status: DC | PRN

## 2018-10-12 MED ORDER — COLCHICINE 0.6 MG PO CAPS: 1.0000 | ORAL_CAPSULE | Freq: Two times a day (BID) | ORAL | 1 refills | Status: DC | PRN

## 2018-10-12 NOTE — Progress Notes (Signed)
Office Visit Note   Patient: [name]           Date of Birth: 04/30/1945           MRN: [id number] Visit Date: 10/12/2018 Requested by: Shirline Frees, MD [place],  [place] [address] PCP: Shirline Frees, MD  Subjective: Chief Complaint  Patient presents with  . Right Elbow - Follow-up    Still very sore, swollen and red. Swelling worsens throughout the day. Today is day 3 of antibiotics.    HPI: He is here for follow-up right elbow pain.  He has had redness and swelling and pain for about a month now.  He was placed on doxycycline 3 days ago and has not noticed any difference.  When he was hospitalized recently, he had chills and a low-grade fever for 1 day but has not had fever since then.  Again, no personal history of gout.               ROS:   All other systems were reviewed and are negative.  Objective: Vital Signs: There were no vitals taken for this visit.  Physical Exam:  General:  Alert and oriented, in no acute distress. Pulm:  Breathing unlabored. Psy:  Normal mood, congruent affect. Skin: There is erythema around the elbow and extending distally into the forearm on the ulnar side. Right elbow: Induration and bogginess around the olecranon, no distinct area of pain.  Full range of motion of the elbow.  Imaging: Limited diagnostic ultrasound right elbow: There is substantial subcutaneous edema and soft tissue swelling with hyperemia on power Doppler imaging.  I do not see a distinct bursal effusion or abscess.  Assessment & Plan: 1.  Persistent right elbow pain and swelling, possible cellulitis but cannot rule out first episode of gout.  Osteomyelitis would be another concern. -We will order an MRI scan and meanwhile, try a short treatment with colchicine to see how he responds.     Procedures: No procedures performed  No notes on file     PMFS History: Patient Active Problem List   Diagnosis Date Noted  . Acute kidney  injury superimposed on chronic kidney disease ([place]) 10/04/2018  . AKI (acute kidney injury) ([place]) 10/03/2018  . Hypoalbuminemia 10/03/2018  . Hypokalemia 10/03/2018  . Hypothyroidism 10/03/2018  . CKD (chronic kidney disease), stage III ([place])   . Iron deficiency anemia   . Symptomatic anemia 06/29/2016  . Chronic diastolic heart failure ([place]) 01/14/2014  . Stable angina ([place]) 01/14/2014  . Obesity 01/14/2014  . Pulmonary sarcoidosis ([place]) 01/14/2014  . Uncontrolled diabetes mellitus ([place]) 01/14/2014  . Hyperlipidemia 01/14/2014  . Coronary atherosclerosis of native coronary artery   . Sinusitis, chronic 05/30/2011  . Sleep apnea 10/09/2008  . ADRENAL INSUFFICIENCY, HX OF 02/21/2008  . Coronary artery disease 11/20/2007  . Sarcoidosis 02/20/2007  . Diabetes mellitus without complication ([place]) 64/15/8309  . GERD 12/20/2006   Past Medical History:  Diagnosis Date  . Adrenal insufficiency ([place])   . Allergy   . Anemia   . Arthritis    feet   . CKD (chronic kidney disease), stage III ([place])   . Coronary artery disease    has stents  . Coronary atherosclerosis of native coronary artery    Proximal LAD, posterior lateral stent widely patent-10/12/11  . Diabetes mellitus    insulin and pills  . Hearing loss    wears hearing aids  . Heart attack ([place]) 2010  .  History of blood transfusion 06/30/2016   [facility] - 2 units transfused  . Hypertension   . OSA (obstructive sleep apnea)    uses VPAC sleep study 2 years done through [facility]. Dr. Marlou Porch arranged study  . Pneumonia    3-4 years ago  . Sarcoid   . Sleep apnea    uses CPAP nightly  . Thyroid disease   . Type 2 diabetes mellitus (HCC)     Family History  Problem Relation Age of Onset  . Kidney disease Mother   . Diabetes Mother   . Kidney cancer Mother   . Heart attack Father   . Asthma Sister   . Anesthesia problems Sister        "Kidney's did not wake up"  . Colon cancer Neg Hx   . Colon polyps Neg Hx   .  Rectal cancer Neg Hx   . Stomach cancer Neg Hx     Past Surgical History:  Procedure Laterality Date  . APPENDECTOMY    . CATARACT EXTRACTION  2011   bilat  . CHOLECYSTECTOMY  01/25/2011   Procedure: LAPAROSCOPIC CHOLECYSTECTOMY WITH INTRAOPERATIVE CHOLANGIOGRAM;  Surgeon: Judieth Keens, DO;  Location: [facility];  Service: General;  Laterality: N/A;  . COLONOSCOPY     several  . CORONARY ANGIOPLASTY     most recent 11/2009  . CORONARY STENT INTERVENTION N/A 08/07/2018   Procedure: CORONARY STENT INTERVENTION;  Surgeon: Jettie Booze, MD;  Location: [facility];  Service: Cardiovascular;  Laterality: N/A;  . CORONARY STENT PLACEMENT  2009   in LAD and side branch PTCA  . INTERCOSTAL NERVE BLOCK  2011, 06/2016   x2. lumbar spine  . LEFT HEART CATHETERIZATION WITH CORONARY ANGIOGRAM Bilateral 10/12/2011   Procedure: LEFT HEART CATHETERIZATION WITH CORONARY ANGIOGRAM;  Surgeon: Candee Furbish, MD;  Location: [facility] CATH LAB;  Service: Cardiovascular;  Laterality: Bilateral;  . LEFT HEART CATHETERIZATION WITH CORONARY ANGIOGRAM N/A 04/01/2014   Procedure: LEFT HEART CATHETERIZATION WITH CORONARY ANGIOGRAM;  Surgeon: Candee Furbish, MD;  Location: [facility] CATH LAB;  Service: Cardiovascular;  Laterality: N/A;  . RIGHT/LEFT HEART CATH AND CORONARY ANGIOGRAPHY N/A 08/07/2018   Procedure: RIGHT/LEFT HEART CATH AND CORONARY ANGIOGRAPHY;  Surgeon: Jettie Booze, MD;  Location: [facility];  Service: Cardiovascular;  Laterality: N/A;   Social History   Occupational History  . 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 Smoking status: Never Smoker  . Smokeless tobacco: Never Used  Substance and Sexual Activity  . Alcohol use: No  . Drug use: No  . Sexual activity: Not on file

## 2018-10-16 ENCOUNTER — Telehealth: Payer: Self-pay

## 2018-10-16 DIAGNOSIS — N179 Acute kidney failure, unspecified: Secondary | ICD-10-CM | POA: Diagnosis not present

## 2018-10-16 DIAGNOSIS — Z794 Long term (current) use of insulin: Secondary | ICD-10-CM | POA: Diagnosis not present

## 2018-10-16 DIAGNOSIS — N183 Chronic kidney disease, stage 3 (moderate): Secondary | ICD-10-CM | POA: Diagnosis not present

## 2018-10-16 DIAGNOSIS — E1142 Type 2 diabetes mellitus with diabetic polyneuropathy: Secondary | ICD-10-CM | POA: Diagnosis not present

## 2018-10-16 NOTE — Telephone Encounter (Signed)
I called the patient to check on how his elbow is doing. He said it is still quite painful. The swelling and redness is not as bad during the day, but it worsens at night. Still taking his medications as prescribed. The MRI is still pending [name] has contacted [facility] to see if he can get a sooner appointment than the end of the month).

## 2018-10-16 NOTE — Telephone Encounter (Signed)
Ok, thanks for checking.  If he gets worse before the MRI, he should come back in right away (or go to the ER if after hours).

## 2018-10-16 NOTE — Telephone Encounter (Signed)
I left this information on the patient's voice mail. Hopefully, he will hear soon from [facility] or our referral coordinator about the MRI.

## 2018-10-20 DIAGNOSIS — M25521 Pain in right elbow: Secondary | ICD-10-CM | POA: Diagnosis not present

## 2018-10-29 DIAGNOSIS — E039 Hypothyroidism, unspecified: Secondary | ICD-10-CM | POA: Diagnosis not present

## 2018-10-29 DIAGNOSIS — N183 Chronic kidney disease, stage 3 (moderate): Secondary | ICD-10-CM | POA: Diagnosis not present

## 2018-10-29 DIAGNOSIS — E1142 Type 2 diabetes mellitus with diabetic polyneuropathy: Secondary | ICD-10-CM | POA: Diagnosis not present

## 2018-10-29 DIAGNOSIS — Z794 Long term (current) use of insulin: Secondary | ICD-10-CM | POA: Diagnosis not present

## 2018-10-29 DIAGNOSIS — E1165 Type 2 diabetes mellitus with hyperglycemia: Secondary | ICD-10-CM | POA: Diagnosis not present

## 2018-10-29 DIAGNOSIS — I10 Essential (primary) hypertension: Secondary | ICD-10-CM | POA: Diagnosis not present

## 2018-10-29 DIAGNOSIS — Z23 Encounter for immunization: Secondary | ICD-10-CM | POA: Diagnosis not present

## 2018-10-29 DIAGNOSIS — E7800 Pure hypercholesterolemia, unspecified: Secondary | ICD-10-CM | POA: Diagnosis not present

## 2018-10-30 ENCOUNTER — Ambulatory Visit (INDEPENDENT_AMBULATORY_CARE_PROVIDER_SITE_OTHER): Payer: Medicare HMO | Admitting: Orthopedic Surgery

## 2018-10-30 ENCOUNTER — Encounter: Payer: Self-pay | Admitting: Orthopedic Surgery

## 2018-10-30 ENCOUNTER — Other Ambulatory Visit: Payer: Self-pay

## 2018-10-30 DIAGNOSIS — M25521 Pain in right elbow: Secondary | ICD-10-CM

## 2018-10-30 NOTE — Progress Notes (Addendum)
Office Visit Note   Patient: [name]           Date of Birth: 12-31-1945           MRN: [id number] Visit Date: 10/30/2018              Requested by: Shirline Frees, MD [facility] [place],  [place] [address] PCP: Shirline Frees, MD  Chief Complaint  Patient presents with  . Right Elbow - Follow-up      HPI: The patient is a 74 year old gentleman seen today in follow-up for right elbow pain.  He is recently had an MRI of his right elbow.  He has been having ongoing issues with pain swelling and redness of his right elbow for about 6 weeks now.  He has completed doxycycline course.  Has had no history of gout.  Was placed on colchicine at last visit and has felt significantly better following this.  States he is currently out of colchicine.  Swelling and redness to his arm has come down considerably.  Assessment & Plan: Visit Diagnoses:  1. Pain in right elbow     Plan: We will refill the colchicine today.  Send off blood work for liver function as well as uric acid.  Anticipate sending in allopurinol tomorrow after we receive his lab work.  Patient is in agreement with plan.  Follow-Up Instructions: Return in about 2 weeks (around 11/13/2018).   Right Elbow Exam   Range of Motion  The patient has normal right elbow ROM.  Muscle Strength  The patient has normal right elbow strength.  Comments:   bogginess around the olecranon, no distinct area of tenderness      Patient is alert, oriented, no adenopathy, well-dressed, normal affect, normal respiratory effort. Visible tophi over olecranon  imaging: No results found. No images are attached to the encounter  MRI impression: #1 moderate olecranon bursitis with surrounding soft tissue swelling.  The bursal fluid is hyperintense on T1 imaging, suggesting complex proteinaceous fluid).  Differential diagnosis includes hemorrhagic, infectious or inflammatory etiologies.  #2 mild common forearm  extensor tendinosis.  MRI available on canopy.  Labs: Lab Results  Component Value Date   HGBA1C 10.2 (H) 10/04/2018   HGBA1C (H) 08/15/2008    12.2 (NOTE) The ADA recommends the following therapeutic goal for glycemic control related to Hgb A1c measurement: Goal of therapy: <6.5 Hgb A1c  Reference: American Diabetes Association: Clinical Practice Recommendations 2010, Diabetes Care, 2010, 33: (Suppl  1).   HGBA1C (H) 11/27/2007    9.5 (NOTE)   The ADA recommends the following therapeutic goal for glycemic   control related to Hgb A1C measurement:   Goal of Therapy:   < 7.0% Hgb A1C   Reference: American Diabetes Association: Clinical Practice   Recommendations 2008, Diabetes Care,  2008, 31:(Suppl 1).   REPTSTATUS 11/10/2010 FINAL 11/09/2010   CULT NO GROWTH 11/09/2010     Lab Results  Component Value Date   ALBUMIN 2.9 (L) 10/07/2018   ALBUMIN 2.9 (L) 10/04/2018   ALBUMIN 3.2 (L) 10/03/2018   PREALBUMIN 21.9 10/04/2018    Lab Results  Component Value Date   MG 1.7 10/06/2018   MG 1.6 (L) 10/04/2018   MG 1.8 10/03/2018   No results found for: VD25OH  Lab Results  Component Value Date   PREALBUMIN 21.9 10/04/2018   CBC EXTENDED Latest Ref Rng & Units 10/07/2018 10/06/2018 10/05/2018  WBC 4.0 - 10.5 K/uL 10.9(H) 11.3(H) 11.4(H)  RBC 4.22 - 5.81 MIL/uL 3.28(L) 3.10(L) 3.06(L)  HGB 13.0 - 17.0 g/dL 10.2(L) 9.6(L) 9.5(L)  HCT 39.0 - 52.0 % 31.5(L) 30.6(L) 29.5(L)  PLT 150 - 400 K/uL 244 237 240  NEUTROABS 1.7 - 7.7 K/uL - - -  LYMPHSABS 0.7 - 4.0 K/uL - - -     There is no height or weight on file to calculate BMI.  Orders:  Orders Placed This Encounter  Procedures  . Uric acid  . Hepatic function panel   No orders of the defined types were placed in this encounter.    Procedures: No procedures performed  Clinical Data: No additional findings.  ROS:  All other systems negative, except as noted in the HPI. Review of Systems  Constitutional: Negative for  chills and fever.  Musculoskeletal: Positive for arthralgias and joint swelling.    Objective: Vital Signs: There were no vitals taken for this visit.  Specialty Comments:  No specialty comments available.  PMFS History: Patient Active Problem List   Diagnosis Date Noted  . Pain in right elbow 10/30/2018  . Acute kidney injury superimposed on chronic kidney disease ([place]) 10/04/2018  . AKI (acute kidney injury) ([place]) 10/03/2018  . Hypoalbuminemia 10/03/2018  . Hypokalemia 10/03/2018  . Hypothyroidism 10/03/2018  . CKD (chronic kidney disease), stage III ([place])   . Iron deficiency anemia   . Symptomatic anemia 06/29/2016  . Chronic diastolic heart failure ([place]) 01/14/2014  . Stable angina ([place]) 01/14/2014  . Obesity 01/14/2014  . Pulmonary sarcoidosis ([place]) 01/14/2014  . Uncontrolled diabetes mellitus ([place]) 01/14/2014  . Hyperlipidemia 01/14/2014  . Coronary atherosclerosis of native coronary artery   . Sinusitis, chronic 05/30/2011  . Sleep apnea 10/09/2008  . ADRENAL INSUFFICIENCY, HX OF 02/21/2008  . Coronary artery disease 11/20/2007  . Sarcoidosis 02/20/2007  . Diabetes mellitus without complication ([place]) 123XX123  . GERD 12/20/2006   Past Medical History:  Diagnosis Date  . Adrenal insufficiency ([place])   . Allergy   . Anemia   . Arthritis    feet   . CKD (chronic kidney disease), stage III ([place])   . Coronary artery disease    has stents  . Coronary atherosclerosis of native coronary artery    Proximal LAD, posterior lateral stent widely patent-10/12/11  . Diabetes mellitus    insulin and pills  . Hearing loss    wears hearing aids  . Heart attack ([place]) 2010  . History of blood transfusion 06/30/2016   [facility] - 2 units transfused  . Hypertension   . OSA (obstructive sleep apnea)    uses VPAC sleep study 2 years done through [facility]. Dr. Marlou Porch arranged study  . Pneumonia    3-4 years ago  . Sarcoid   . Sleep apnea    uses CPAP nightly  . Thyroid  disease   . Type 2 diabetes mellitus (H[facility])     Family History  Problem Relation Age of Onset  . Kidney disease Mother   . Diabetes Mother   . Kidney cancer Mother   . Heart attack Father   . Asthma Sister   . Anesthesia problems Sister        "Kidney's did not wake up"  . Colon cancer Neg Hx   . Colon polyps Neg Hx   . Rectal cancer Neg Hx   . Stomach cancer Neg Hx     Past Surgical History:  Procedure Laterality Date  . APPENDECTOMY    . CATARACT EXTRACTION  2011  bilat  . CHOLECYSTECTOMY  01/25/2011   Procedure: LAPAROSCOPIC CHOLECYSTECTOMY WITH INTRAOPERATIVE CHOLANGIOGRAM;  Surgeon: Judieth Keens, DO;  Location: [facility];  Service: General;  Laterality: N/A;  . COLONOSCOPY     several  . CORONARY ANGIOPLASTY     most recent 11/2009  . CORONARY STENT INTERVENTION N/A 08/07/2018   Procedure: CORONARY STENT INTERVENTION;  Surgeon: Jettie Booze, MD;  Location: [facility];  Service: Cardiovascular;  Laterality: N/A;  . CORONARY STENT PLACEMENT  2009   in LAD and side branch PTCA  . INTERCOSTAL NERVE BLOCK  2011, 06/2016   x2. lumbar spine  . LEFT HEART CATHETERIZATION WITH CORONARY ANGIOGRAM Bilateral 10/12/2011   Procedure: LEFT HEART CATHETERIZATION WITH CORONARY ANGIOGRAM;  Surgeon: Candee Furbish, MD;  [facility] CATH LAB;  Service: Cardiovascular;  Laterality: Bilateral;  . LEFT HEART CATHETERIZATION WITH CORONARY ANGIOGRAM N/A 04/01/2014   Procedure: LEFT HEART CATHETERIZATION WITH CORONARY ANGIOGRAM;  Surgeon: Candee Furbish, MD;  Location[facility] CATH LAB;  Service: Cardiovascular;  Laterality: N/A;  . RIGHT/LEFT HEART CATH AND CORONARY ANGIOGRAPHY N/A 08/07/2018   Procedure: RIGHT/LEFT HEART CATH AND CORONARY ANGIOGRAPHY;  Surgeon: Jettie Booze, MD;  Location: [facility];  Service: Cardiovascular;  Laterality: N/A;   Social History   Occupational History  . 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 Occupation: Best boy:  [employer]  Tobacco Use  . Smoking status: Never Smoker  . Smokeless tobacco: Never Used  Substance and Sexual Activity  . Alcohol use: No  . Drug use: No  . Sexual activity: Not on file

## 2018-10-31 ENCOUNTER — Telehealth: Payer: Self-pay

## 2018-10-31 LAB — URIC ACID: Uric Acid, Serum: 7.2 mg/dL (ref 4.0–8.0)

## 2018-10-31 MED ORDER — ALLOPURINOL 100 MG PO TABS: 100.0000 mg | ORAL_TABLET | Freq: Two times a day (BID) | ORAL | 3 refills | Status: DC

## 2018-10-31 NOTE — Telephone Encounter (Signed)
-----   Message from Suzan Slick, NP sent at 10/31/2018  9:21 AM EDT ----- Will you call and let him know, we will send in allopurinol and see him back to reevaluate in 3 weeks

## 2018-10-31 NOTE — Progress Notes (Signed)
Will you call and let him know, we will send in allopurinol and see him back to reevaluate in 3 weeks

## 2018-10-31 NOTE — Telephone Encounter (Signed)
Called pt to advise of lab results and rx called into pharm. To call with any questions.

## 2018-10-31 NOTE — Addendum Note (Signed)
Addended by: Dondra Prader R on: 10/31/2018 10:29 AM   Modules accepted: Orders

## 2018-11-08 ENCOUNTER — Other Ambulatory Visit: Payer: Self-pay

## 2018-11-08 ENCOUNTER — Encounter (INDEPENDENT_AMBULATORY_CARE_PROVIDER_SITE_OTHER): Payer: Medicare HMO | Admitting: Ophthalmology

## 2018-11-08 DIAGNOSIS — I10 Essential (primary) hypertension: Secondary | ICD-10-CM | POA: Diagnosis not present

## 2018-11-08 DIAGNOSIS — E11311 Type 2 diabetes mellitus with unspecified diabetic retinopathy with macular edema: Secondary | ICD-10-CM

## 2018-11-08 DIAGNOSIS — H35033 Hypertensive retinopathy, bilateral: Secondary | ICD-10-CM | POA: Diagnosis not present

## 2018-11-08 DIAGNOSIS — E113512 Type 2 diabetes mellitus with proliferative diabetic retinopathy with macular edema, left eye: Secondary | ICD-10-CM | POA: Diagnosis not present

## 2018-11-08 DIAGNOSIS — E113311 Type 2 diabetes mellitus with moderate nonproliferative diabetic retinopathy with macular edema, right eye: Secondary | ICD-10-CM | POA: Diagnosis not present

## 2018-11-08 DIAGNOSIS — H43813 Vitreous degeneration, bilateral: Secondary | ICD-10-CM | POA: Diagnosis not present

## 2018-11-30 DIAGNOSIS — I10 Essential (primary) hypertension: Secondary | ICD-10-CM | POA: Diagnosis not present

## 2018-11-30 DIAGNOSIS — E785 Hyperlipidemia, unspecified: Secondary | ICD-10-CM | POA: Diagnosis not present

## 2018-11-30 DIAGNOSIS — E039 Hypothyroidism, unspecified: Secondary | ICD-10-CM | POA: Diagnosis not present

## 2018-11-30 DIAGNOSIS — I2510 Atherosclerotic heart disease of native coronary artery without angina pectoris: Secondary | ICD-10-CM | POA: Diagnosis not present

## 2018-11-30 DIAGNOSIS — E1165 Type 2 diabetes mellitus with hyperglycemia: Secondary | ICD-10-CM | POA: Diagnosis not present

## 2018-11-30 DIAGNOSIS — E11319 Type 2 diabetes mellitus with unspecified diabetic retinopathy without macular edema: Secondary | ICD-10-CM | POA: Diagnosis not present

## 2018-11-30 DIAGNOSIS — N183 Chronic kidney disease, stage 3 (moderate): Secondary | ICD-10-CM | POA: Diagnosis not present

## 2018-11-30 DIAGNOSIS — E1142 Type 2 diabetes mellitus with diabetic polyneuropathy: Secondary | ICD-10-CM | POA: Diagnosis not present

## 2018-12-11 ENCOUNTER — Telehealth: Payer: Self-pay

## 2018-12-11 NOTE — Telephone Encounter (Signed)

## 2018-12-12 ENCOUNTER — Other Ambulatory Visit: Payer: Self-pay

## 2018-12-12 ENCOUNTER — Telehealth (INDEPENDENT_AMBULATORY_CARE_PROVIDER_SITE_OTHER): Payer: Medicare HMO | Admitting: Cardiology

## 2018-12-12 VITALS — BP 136/64 | HR 78 | Ht 70.0 in | Wt 228.0 lb

## 2018-12-12 DIAGNOSIS — N179 Acute kidney failure, unspecified: Secondary | ICD-10-CM

## 2018-12-12 DIAGNOSIS — Z95820 Peripheral vascular angioplasty status with implants and grafts: Secondary | ICD-10-CM | POA: Diagnosis not present

## 2018-12-12 DIAGNOSIS — I2510 Atherosclerotic heart disease of native coronary artery without angina pectoris: Secondary | ICD-10-CM | POA: Diagnosis not present

## 2018-12-12 DIAGNOSIS — I5032 Chronic diastolic (congestive) heart failure: Secondary | ICD-10-CM | POA: Diagnosis not present

## 2018-12-12 DIAGNOSIS — E7800 Pure hypercholesterolemia, unspecified: Secondary | ICD-10-CM

## 2018-12-12 NOTE — Progress Notes (Signed)
Virtual Visit via Video Note   This visit type was conducted due to national recommendations for restrictions regarding the COVID-19 Pandemic (e.g. social distancing) in an effort to limit this patient's exposure and mitigate transmission in our community.  Due to his co-morbid illnesses, this patient is at least at moderate risk for complications without adequate follow up.  This format is felt to be most appropriate for this patient at this time.  All issues noted in this document were discussed and addressed.  A limited physical exam was performed with this format.  Please refer to the patient's chart for his consent to telehealth for [facility]  Telehealth consent was provided by [name], CMA  Date:  12/12/2018   ID:  [name], DOB 08/30/1945, MRN [id number]  Patient Location: Home Provider Location: Home  PCP:  Shirline Frees, MD  Cardiologist:  Candee Furbish, MD  Electrophysiologist:  None   Evaluation Performed:  Follow-Up Visit  Chief Complaint: Post cardiac catheterization follow-up  History of Present Illness:    [name] is a 73 y.o. male with coronary disease obesity diabetes CKD 3 adrenal insufficiency on chronic steroids who had chest pain shortness of breath underwent left and right heart catheterization on 08/07/2018 here for follow-up of coronary artery disease and review of AKI hospitalization in late July.  Stents were placed to the distal right coronary artery PDA region.  Right heart cath thankfully was normal.  Overall he states that he is feeling better, he is not having to stop for instance when walking.  Overall he is pleased with his progress.  In hospital 10/03/18 - AKI, renal function. Only taking lasix 80 QD. Was on three times a day previously.   Blood work done 3 weeks ago.  Renal function is stabilized.  Dr. Kenton [name] has been monitoring.  We discussed and reviewed his hospitalization.  He also had a bout of gout.  Reviewed with him that he should  not take NSAIDs in this setting.  Denies any fevers chills nausea vomiting syncope bleeding  The patient does not have symptoms concerning for COVID-19 infection (fever, chills, cough, or new shortness of breath).    Past Medical History:  Diagnosis Date  . Adrenal insufficiency ([place])   . Allergy   . Anemia   . Arthritis    feet   . CKD (chronic kidney disease), stage III ([place])   . Coronary artery disease    has stents  . Coronary atherosclerosis of native coronary artery    Proximal LAD, posterior lateral stent widely patent-10/12/11  . Diabetes mellitus    insulin and pills  . Hearing loss    wears hearing aids  . Heart attack ([place]) 2010  . History of blood transfusion 06/30/2016   [name] - 2 units transfused  . Hypertension   . OSA (obstructive sleep apnea)    uses VPAC sleep study 2 years done through [facility]. Dr. Marlou Porch arranged study  . Pneumonia    3-4 years ago  . Sarcoid   . Sleep apnea    uses CPAP nightly  . Thyroid disease   . Type 2 diabetes mellitus ([place])    Past Surgical History:  Procedure Laterality Date  . APPENDECTOMY    . CATARACT EXTRACTION  2011   bilat  . CHOLECYSTECTOMY  01/25/2011   Procedure: LAPAROSCOPIC CHOLECYSTECTOMY WITH INTRAOPERATIVE CHOLANGIOGRAM;  Surgeon: Craig Keens, DO;  Location: [facility];  Service: General;  Laterality: N/A;  . COLONOSCOPY  several  . CORONARY ANGIOPLASTY     most recent 11/2009  . CORONARY STENT INTERVENTION N/A 08/07/2018   Procedure: CORONARY STENT INTERVENTION;  Surgeon: Craig Booze, MD;  Location: [facility];  Service: Cardiovascular;  Laterality: N/A;  . CORONARY STENT PLACEMENT  2009   in LAD and side branch PTCA  . INTERCOSTAL NERVE BLOCK  2011, 06/2016   x2. lumbar spine  . LEFT HEART CATHETERIZATION WITH CORONARY ANGIOGRAM Bilateral 10/12/2011   Procedure: LEFT HEART CATHETERIZATION WITH CORONARY ANGIOGRAM;  Surgeon: Candee Furbish, MD;  Location: [facility] CATH LAB;  Service:  Cardiovascular;  Laterality: Bilateral;  . LEFT HEART CATHETERIZATION WITH CORONARY ANGIOGRAM N/A 04/01/2014   Procedure: LEFT HEART CATHETERIZATION WITH CORONARY ANGIOGRAM;  Surgeon: Candee Furbish, MD;  Location: [facility] CATH LAB;  Service: Cardiovascular;  Laterality: N/A;  . RIGHT/LEFT HEART CATH AND CORONARY ANGIOGRAPHY N/A 08/07/2018   Procedure: RIGHT/LEFT HEART CATH AND CORONARY ANGIOGRAPHY;  Surgeon: Craig Booze, MD;  Location: [facility];  Service: Cardiovascular;  Laterality: N/A;     Current Meds  Medication Sig  . acetaminophen (TYLENOL) 500 MG tablet Take 500 mg by mouth daily as needed for moderate pain or headache.   . albuterol (PROVENTIL) (2.5 MG/3ML) 0.083% nebulizer solution Take 3 mLs (2.5 mg total) by nebulization every 6 (six) hours as needed for wheezing or shortness of breath. Dx Code D86.0  . Albuterol Sulfate (PROAIR RESPICLICK) 123XX123 (90 BASE) MCG/ACT AEPB Inhale 2 puffs into the lungs every 6 (six) hours as needed.  Marland Kitchen allopurinol (ZYLOPRIM) 100 MG tablet Take 1 tablet (100 mg total) by mouth 2 (two) times daily.  Marland Kitchen aspirin EC 81 MG tablet Take 81 mg by mouth every evening.   . Cholecalciferol (VITAMIN D3) 3000 UNITS TABS Take 3,000 Units by mouth daily.  . clopidogrel (PLAVIX) 75 MG tablet Take 1 tablet (75 mg total) by mouth daily.  . Coenzyme Q10 (COQ-10) 400 MG CAPS Take 400 mg by mouth every evening.  . Colchicine 0.6 MG CAPS Take 1 tablet by mouth 2 (two) times daily as needed.  . DROPLET INSULIN SYRINGE 31G X 5/16" 1 ML MISC   . fish oil-omega-3 fatty acids 1000 MG capsule Take 1 g by mouth daily.   . furosemide (LASIX) 80 MG tablet Take 1 tablet (80 mg total) by mouth daily.  Marland Kitchen glipiZIDE (GLUCOTROL) 10 MG tablet Take 10 mg by mouth every evening.   . insulin aspart protamine- aspart (NOVOLOG MIX 70/30) (70-30) 100 UNIT/ML injection Inject 0.3 mLs (30 Units total) into the skin 2 (two) times daily with a meal.  . levothyroxine (SYNTHROID, LEVOTHROID) 50  MCG tablet Take 50 mcg by mouth daily.   . montelukast (SINGULAIR) 10 MG tablet Take 10 mg by mouth daily as needed (allergies).   . Multiple Vitamin (MULTIVITAMIN WITH MINERALS) TABS tablet Take 1 tablet by mouth daily.  . pantoprazole (PROTONIX) 40 MG tablet Take 40 mg by mouth daily.  . predniSONE (DELTASONE) 10 MG tablet Take 1 tablet (10 mg total) by mouth daily with breakfast. T  . rosuvastatin (CRESTOR) 10 MG tablet Take 1 tablet (10 mg total) by mouth at bedtime.  . sodium chloride (OCEAN) 0.65 % SOLN nasal spray Place 1 spray into both nostrils as needed for congestion.  . Vitamin D, Ergocalciferol, (DRISDOL) 50000 UNITS CAPS capsule Take 50,000 Units by mouth every Sunday.      Allergies:   Hydrocortisone, Metolazone, and Statins   Social History  Tobacco Use  . Smoking status: Never Smoker  . Smokeless tobacco: Never Used  Substance Use Topics  . Alcohol use: No  . Drug use: No     Family Hx: The patient's family history includes Anesthesia problems in his sister; Asthma in his sister; Diabetes in his mother; Heart attack in his father; Kidney cancer in his mother; Kidney disease in his mother. There is no history of Colon cancer, Colon polyps, Rectal cancer, or Stomach cancer.  ROS:   Please see the history of present illness.     All other systems reviewed and are negative.   Prior CV studies:   The following studies were reviewed today:  Cath 08/07/18:  Prox RCA to Mid RCA lesion is 25% stenosed.  Ost RPDA lesion is 80% stenosed. The PDA is a long vessel that feeds the apex.  A drug-eluting stent was successfully placed using a STENT RESOLUTE ONYX 2.25X8.  Post intervention, there is a 0% residual stenosis.  Ost RPDA to RPDA lesion is 70% stenosed.  A drug-eluting stent was successfully placed using a STENT RESOLUTE ONYX 2.0X30.  Post intervention, there is a 0% residual stenosis.  Previously placed Post Atrio stent (unknown type) is widely patent.   Prox LAD stent has a focal 40% restenosis.  LV end diastolic pressure is normal.  There is no aortic valve stenosis.  Mid Cx lesion is 50% stenosed.  Ao sat: 99%, PA sat 75%, mean PA 18 mm Hg; mean PCWP 12 mm Hg; CO 6.9 L/min; CI 3.1   Continue dual antiplatelet therapy and aggressive secondary prevention.  Hydrate post procedure with plan for same day discharge.   Diagnostic Dominance: Right  Intervention      Labs/Other Tests and Data Reviewed:    EKG:  No new  Recent Labs: 07/27/2018: NT-Pro BNP 124 10/03/2018: B Natriuretic Peptide 42.4; TSH 2.678 10/04/2018: ALT 14 10/06/2018: Magnesium 1.7 10/07/2018: BUN 27; Creatinine, Ser 2.35; Hemoglobin 10.2; Platelets 244; Potassium 3.5; Sodium 141   Recent Lipid Panel Lab Results  Component Value Date/Time   CHOL 219 (H) 07/27/2018 12:39 PM   TRIG 269 (H) 07/27/2018 12:39 PM   HDL 32 (L) 07/27/2018 12:39 PM   CHOLHDL 6.8 (H) 07/27/2018 12:39 PM   CHOLHDL 11.4 08/15/2008 04:08 AM   LDLCALC 133 (H) 07/27/2018 12:39 PM    Wt Readings from Last 3 Encounters:  12/12/18 228 lb (103.4 kg)  10/10/18 232 lb 9.6 oz (105.5 kg)  10/07/18 232 lb 9.6 oz (105.5 kg)     Objective:    Vital Signs:  BP 136/64   Pulse 78   Ht 5\' 10"  (1.778 m)   Wt 228 lb (103.4 kg)   BMI 32.71 kg/m    VITAL SIGNS:  reviewed GEN:  no acute distress EYES:  sclerae anicteric, EOMI - Extraocular Movements Intact RESPIRATORY:  normal respiratory effort, symmetric expansion SKIN:  no rash, lesions or ulcers. MUSCULOSKELETAL:  no obvious deformities. NEURO:  alert and oriented x 3, no obvious focal deficit PSYCH:  normal affect  ASSESSMENT & PLAN:    Coronary artery disease - Distal RCA/PDA stents, 2, placed.  Overall feels much better.  Continue with current plan.    Dual antiplatelet therapy for a year.  May consider Plavix monotherapy after that.  Hyperlipidemia - He is continuing with his Crestor 10 mg once a day.  His wife takes Crestor. Has  not bothered him.   CKD 3 with recent AKI hospitalization -Creatinine was 1.4 range surrounding  the catheterization.  Avoid NSAIDs.  -He was hospitalized in late July with acute kidney injury, significant creatinine elevation approximately 3.4.  This resolved.  He is off of his angiotensin receptor blocker.  He is also decreased his furosemide to 80 mg once a day from 3 times a day.  No changes made.  Dr. Kenton [name] is watching his renal function.  COVID-19 Education: The signs and symptoms of COVID-19 were discussed with the patient and how to seek care for testing (follow up with PCP or arrange E-visit).  The importance of social distancing was discussed today.  Time:   Today, I have spent 20 minutes with the patient with telehealth technology discussing the above problems and and chart review.     Medication Adjustments/Labs and Tests Ordered: Current medicines are reviewed at length with the patient today.  Concerns regarding medicines are outlined above.   Tests Ordered: No orders of the defined types were placed in this encounter.   Medication Changes: No orders of the defined types were placed in this encounter.   Follow Up:  Virtual Visit or In Person in 6 month(s)  Signed, Candee Furbish, MD  12/12/2018 4:52 PM    [place]

## 2018-12-12 NOTE — Patient Instructions (Signed)
Medication Instructions:  The current medical regimen is effective;  continue present plan and medications.  If you need a refill on your cardiac medications before your next appointment, please call your pharmacy.   Follow-Up: At [facility], you and your health needs are our priority.  As part of our continuing mission to provide you with exceptional heart care, we have created designated Provider Care Teams.  These Care Teams include your primary Cardiologist (physician) and Advanced Practice Providers (APPs -  Physician Assistants and Nurse Practitioners) who all work together to provide you with the care you need, when you need it. You will need a follow up appointment in 6 months.  Please call our office 2 months in advance to schedule this appointment.  You may see Mark Skains, MD or one of the following Advanced Practice Providers on your designated Care Team:   Lori Gerhardt, NP Laura Ingold, NP . Jill McDaniel, NP  Thank you for choosing [facility]!!       

## 2018-12-20 ENCOUNTER — Encounter (INDEPENDENT_AMBULATORY_CARE_PROVIDER_SITE_OTHER): Payer: Medicare HMO | Admitting: Ophthalmology

## 2018-12-26 ENCOUNTER — Encounter (INDEPENDENT_AMBULATORY_CARE_PROVIDER_SITE_OTHER): Payer: Medicare HMO | Admitting: Ophthalmology

## 2018-12-26 DIAGNOSIS — E113311 Type 2 diabetes mellitus with moderate nonproliferative diabetic retinopathy with macular edema, right eye: Secondary | ICD-10-CM

## 2018-12-26 DIAGNOSIS — E11311 Type 2 diabetes mellitus with unspecified diabetic retinopathy with macular edema: Secondary | ICD-10-CM

## 2018-12-26 DIAGNOSIS — H35033 Hypertensive retinopathy, bilateral: Secondary | ICD-10-CM | POA: Diagnosis not present

## 2018-12-26 DIAGNOSIS — H43813 Vitreous degeneration, bilateral: Secondary | ICD-10-CM

## 2018-12-26 DIAGNOSIS — I10 Essential (primary) hypertension: Secondary | ICD-10-CM

## 2018-12-26 DIAGNOSIS — E113512 Type 2 diabetes mellitus with proliferative diabetic retinopathy with macular edema, left eye: Secondary | ICD-10-CM | POA: Diagnosis not present

## 2019-01-22 ENCOUNTER — Other Ambulatory Visit: Payer: Self-pay | Admitting: Family

## 2019-01-29 DIAGNOSIS — N183 Chronic kidney disease, stage 3 unspecified: Secondary | ICD-10-CM | POA: Diagnosis not present

## 2019-01-29 DIAGNOSIS — K219 Gastro-esophageal reflux disease without esophagitis: Secondary | ICD-10-CM | POA: Diagnosis not present

## 2019-01-29 DIAGNOSIS — I2510 Atherosclerotic heart disease of native coronary artery without angina pectoris: Secondary | ICD-10-CM | POA: Diagnosis not present

## 2019-01-29 DIAGNOSIS — E039 Hypothyroidism, unspecified: Secondary | ICD-10-CM | POA: Diagnosis not present

## 2019-01-29 DIAGNOSIS — J0100 Acute maxillary sinusitis, unspecified: Secondary | ICD-10-CM | POA: Diagnosis not present

## 2019-01-29 DIAGNOSIS — E1165 Type 2 diabetes mellitus with hyperglycemia: Secondary | ICD-10-CM | POA: Diagnosis not present

## 2019-01-29 DIAGNOSIS — I10 Essential (primary) hypertension: Secondary | ICD-10-CM | POA: Diagnosis not present

## 2019-01-29 DIAGNOSIS — E7800 Pure hypercholesterolemia, unspecified: Secondary | ICD-10-CM | POA: Diagnosis not present

## 2019-02-04 ENCOUNTER — Other Ambulatory Visit: Payer: Self-pay

## 2019-02-04 ENCOUNTER — Ambulatory Visit (INDEPENDENT_AMBULATORY_CARE_PROVIDER_SITE_OTHER): Payer: Medicare HMO | Admitting: Orthopedic Surgery

## 2019-02-04 ENCOUNTER — Encounter: Payer: Self-pay | Admitting: Orthopedic Surgery

## 2019-02-04 VITALS — Ht 70.0 in | Wt 228.0 lb

## 2019-02-04 DIAGNOSIS — E1165 Type 2 diabetes mellitus with hyperglycemia: Secondary | ICD-10-CM

## 2019-02-04 MED ORDER — DOXYCYCLINE HYCLATE 100 MG PO TABS: 100.0000 mg | ORAL_TABLET | Freq: Two times a day (BID) | ORAL | 0 refills | Status: DC

## 2019-02-04 NOTE — Progress Notes (Signed)
Office Visit Note   Patient: [name]           Date of Birth: 10-May-1945           MRN: [id number] Visit Date: 02/04/2019              Requested by: Shirline Frees, MD [address] [place],  [place] [address] PCP: Shirline Frees, MD  Chief Complaint  Patient presents with   Left Foot - Follow-up    3rd toe ulceration       HPI: This is a pleasant gentleman who has been seen in the past for of the left lower third toe infection most recently about a year ago.  He was putting up Christmas ornaments over the weekend and climbing on a ladder and developed a blister on the same toe this was 4 days ago.  He and his wife expressed some purulent drainage out of the toe at that time.  He is a diabetic with a hemoglobin A1c of 9 and is also on chronic prednisone.  The toe has been improving.  He denies any fever or chills  Assessment & Plan: Visit Diagnoses: No diagnosis found.  Plan: Clinically I think his toe looks okay I do not see any acute signs of osteomyelitis.  However given his history and his uncontrolled diabetics and prednisone therapy I think it would be prudent to place him on a 14-day course of doxycycline.Marland Kitchen  He will come back in 1 week for reevaluation  Follow-Up Instructions: No follow-ups on file.   Ortho Exam  Patient is alert, oriented, no adenopathy, well-dressed, normal affect, normal respiratory effort. Left foot: Pulses are palpable.  No induration or fluctuance in the foot third toe has a healing blister I cannot express any fluid today.  Imaging: No results found. No images are attached to the encounter.  Labs: Lab Results  Component Value Date   HGBA1C 10.2 (H) 10/04/2018   HGBA1C (H) 08/15/2008    12.2 (NOTE) The ADA recommends the following therapeutic goal for glycemic control related to Hgb A1c measurement: Goal of therapy: <6.5 Hgb A1c  Reference: American Diabetes Association: Clinical Practice Recommendations 2010, Diabetes  Care, 2010, 33: (Suppl  1).   HGBA1C (H) 11/27/2007    9.5 (NOTE)   The ADA recommends the following therapeutic goal for glycemic   control related to Hgb A1C measurement:   Goal of Therapy:   < 7.0% Hgb A1C   Reference: American Diabetes Association: Clinical Practice   Recommendations 2008, Diabetes Care,  2008, 31:(Suppl 1).   LABURIC 7.2 10/30/2018   REPTSTATUS 11/10/2010 FINAL 11/09/2010   CULT NO GROWTH 11/09/2010     Lab Results  Component Value Date   ALBUMIN 2.9 (L) 10/07/2018   ALBUMIN 2.9 (L) 10/04/2018   ALBUMIN 3.2 (L) 10/03/2018   PREALBUMIN 21.9 10/04/2018   LABURIC 7.2 10/30/2018    Lab Results  Component Value Date   MG 1.7 10/06/2018   MG 1.6 (L) 10/04/2018   MG 1.8 10/03/2018   No results found for: VD25OH  Lab Results  Component Value Date   PREALBUMIN 21.9 10/04/2018   CBC EXTENDED Latest Ref Rng & Units 10/07/2018 10/06/2018 10/05/2018  WBC 4.0 - 10.5 K/uL 10.9(H) 11.3(H) 11.4(H)  RBC 4.22 - 5.81 MIL/uL 3.28(L) 3.10(L) 3.06(L)  HGB 13.0 - 17.0 g/dL 10.2(L) 9.6(L) 9.5(L)  HCT 39.0 - 52.0 % 31.5(L) 30.6(L) 29.5(L)  PLT 150 - 400 K/uL 244 237 240  NEUTROABS  1.7 - 7.7 K/uL - - -  LYMPHSABS 0.7 - 4.0 K/uL - - -     Body mass index is 32.71 kg/m.  Orders:  No orders of the defined types were placed in this encounter.  Meds ordered this encounter  Medications   doxycycline (VIBRA-TABS) 100 MG tablet    Sig: Take 1 tablet (100 mg total) by mouth 2 (two) times daily.    Dispense:  28 tablet    Refill:  0     Procedures: No procedures performed  Clinical Data: No additional findings.  ROS:  All other systems negative, except as noted in the HPI. Review of Systems  Objective: Vital Signs: Ht 5\' 10"  (1.778 m)    Wt 228 lb (103.4 kg)    BMI 32.71 kg/m   Specialty Comments:  No specialty comments available.  PMFS History: Patient Active Problem List   Diagnosis Date Noted   Pain in right elbow 10/30/2018   Acute kidney injury  superimposed on chronic kidney disease ([place]) 10/04/2018   AKI (acute kidney injury) ([place]) 10/03/2018   Hypoalbuminemia 10/03/2018   Hypokalemia 10/03/2018   Hypothyroidism 10/03/2018   CKD (chronic kidney disease), stage III (HCC)    Iron deficiency anemia    Symptomatic anemia 06/29/2016   Chronic diastolic heart failure ([place]) 01/14/2014   Stable angina ([place]) 01/14/2014   Obesity 01/14/2014   Pulmonary sarcoidosis ([place]) 01/14/2014   Uncontrolled diabetes mellitus ([place]) 01/14/2014   Hyperlipidemia 01/14/2014   Coronary atherosclerosis of native coronary artery    Sinusitis, chronic 05/30/2011   Sleep apnea 10/09/2008   ADRENAL INSUFFICIENCY, HX OF 02/21/2008   Coronary artery disease 11/20/2007   Sarcoidosis 02/20/2007   Diabetes mellitus without complication ([facility]) 123XX123   GERD 12/20/2006   Past Medical History:  Diagnosis Date   Adrenal insufficiency ([place])    Allergy    Anemia    Arthritis    feet    CKD (chronic kidney disease), stage III    Coronary artery disease    has stents   Coronary atherosclerosis of native coronary artery    Proximal LAD, posterior lateral stent widely patent-10/12/11   Diabetes mellitus    insulin and pills   Hearing loss    wears hearing aids   Heart attack ([facility]) 2010   History of blood transfusion 06/30/2016   [facility] - 2 units transfused   Hypertension    OSA (obstructive sleep apnea)    uses VPAC sleep study 2 years done through [facility]. Dr. Marlou Porch arranged study   Pneumonia    3-4 years ago   Sarcoid    Sleep apnea    uses CPAP nightly   Thyroid disease    Type 2 diabetes mellitus ([place])     Family History  Problem Relation Age of Onset   Kidney disease Mother    Diabetes Mother    Kidney cancer Mother    Heart attack Father    Asthma Sister    Anesthesia problems Sister        "Kidney's did not wake up"   Colon cancer Neg Hx    Colon polyps Neg Hx    Rectal cancer Neg  Hx    Stomach cancer Neg Hx     Past Surgical History:  Procedure Laterality Date   APPENDECTOMY     CATARACT EXTRACTION  2011   bilat   CHOLECYSTECTOMY  01/25/2011   Procedure: LAPAROSCOPIC CHOLECYSTECTOMY WITH INTRAOPERATIVE CHOLANGIOGRAM;  Surgeon: Judieth Keens, DO;  Location: [facility];  Service: General;  Laterality: N/A;   COLONOSCOPY     several   CORONARY ANGIOPLASTY     most recent 11/2009   CORONARY STENT INTERVENTION N/A 08/07/2018   Procedure: CORONARY STENT INTERVENTION;  Surgeon: Jettie Booze, MD;  Location: [facility];  Service: Cardiovascular;  Laterality: N/A;   CORONARY STENT PLACEMENT  2009   in LAD and side branch PTCA   INTERCOSTAL NERVE BLOCK  2011, 06/2016   x2. lumbar spine   LEFT HEART CATHETERIZATION WITH CORONARY ANGIOGRAM Bilateral 10/12/2011   Procedure: LEFT HEART CATHETERIZATION WITH CORONARY ANGIOGRAM;  Surgeon: Candee Furbish, MD;  Location: [facility];  Service: Cardiovascular;  Laterality: Bilateral;   LEFT HEART CATHETERIZATION WITH CORONARY ANGIOGRAM N/A 04/01/2014   Procedure: LEFT HEART CATHETERIZATION WITH CORONARY ANGIOGRAM;  Surgeon: Candee Furbish, MD;  Location: [facility] CATH LAB;  Service: Cardiovascular;  Laterality: N/A;   RIGHT/LEFT HEART CATH AND CORONARY ANGIOGRAPHY N/A 08/07/2018   Procedure: RIGHT/LEFT HEART CATH AND CORONARY ANGIOGRAPHY;  Surgeon: Jettie Booze, MD;  Location: [facility];  Service: Cardiovascular;  Laterality: N/A;   Social History   Occupational History   Occupation: Journalist, 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

## 2019-02-11 ENCOUNTER — Encounter: Payer: Self-pay | Admitting: Orthopedic Surgery

## 2019-02-11 ENCOUNTER — Other Ambulatory Visit: Payer: Self-pay

## 2019-02-11 ENCOUNTER — Ambulatory Visit (INDEPENDENT_AMBULATORY_CARE_PROVIDER_SITE_OTHER): Payer: Medicare HMO | Admitting: Orthopedic Surgery

## 2019-02-11 VITALS — Ht 70.0 in | Wt 228.0 lb

## 2019-02-11 DIAGNOSIS — E1165 Type 2 diabetes mellitus with hyperglycemia: Secondary | ICD-10-CM | POA: Diagnosis not present

## 2019-02-11 NOTE — Progress Notes (Signed)
Office Visit Note   Patient: Craig ROUSSELLE           Date of Birth: 07/15/1945           MRN: [id number] Visit Date: 02/11/2019              Requested by: Shirline Frees, MD [place],  [place] [id number] PCP: Shirline Frees, MD  Chief Complaint  Patient presents with  . Left Foot - Follow-up      HPI: Follow up for blister on plantar wart not what for what know what 3rd toe. This had some drainage when I last saw him last week.  As a precaution I placed him on a short course of doxycycline.  He said it is doing much better he has not had any drainage Assessment & Plan: Visit Diagnoses: No diagnosis found.  Plan: He will finish the doxycycline in about 1 week and I had like to see him in 3 weeks to make sure he has no recurrence of infection  Follow-Up Instructions: No follow-ups on file.   Ortho Exam  Patient is alert, oriented, no adenopathy, well-dressed, normal affect, normal respiratory effort. Left third toe: No erythema no induration blister has all but healed over  Imaging: No results found. No images are attached to the encounter.  Labs: Lab Results  Component Value Date   HGBA1C 10.2 (H) 10/04/2018   HGBA1C (H) 08/15/2008    12.2 (NOTE) The ADA recommends the following therapeutic goal for glycemic control related to Hgb A1c measurement: Goal of therapy: <6.5 Hgb A1c  Reference: American Diabetes Association: Clinical Practice Recommendations 2010, Diabetes Care, 2010, 33: (Suppl  1).   HGBA1C (H) 11/27/2007    9.5 (NOTE)   The ADA recommends the following therapeutic goal for glycemic   control related to Hgb A1C measurement:   Goal of Therapy:   < 7.0% Hgb A1C   Reference: American Diabetes Association: Clinical Practice   Recommendations 2008, Diabetes Care,  2008, 31:(Suppl 1).   LABURIC 7.2 10/30/2018   REPTSTATUS 11/10/2010 FINAL 11/09/2010   CULT NO GROWTH 11/09/2010     Lab Results  Component Value Date   ALBUMIN 2.9  (L) 10/07/2018   ALBUMIN 2.9 (L) 10/04/2018   ALBUMIN 3.2 (L) 10/03/2018   PREALBUMIN 21.9 10/04/2018   LABURIC 7.2 10/30/2018    Lab Results  Component Value Date   MG 1.7 10/06/2018   MG 1.6 (L) 10/04/2018   MG 1.8 10/03/2018   No results found for: VD25OH  Lab Results  Component Value Date   PREALBUMIN 21.9 10/04/2018   CBC EXTENDED Latest Ref Rng & Units 10/07/2018 10/06/2018 10/05/2018  WBC 4.0 - 10.5 K/uL 10.9(H) 11.3(H) 11.4(H)  RBC 4.22 - 5.81 MIL/uL 3.28(L) 3.10(L) 3.06(L)  HGB 13.0 - 17.0 g/dL 10.2(L) 9.6(L) 9.5(L)  HCT 39.0 - 52.0 % 31.5(L) 30.6(L) 29.5(L)  PLT 150 - 400 K/uL 244 237 240  NEUTROABS 1.7 - 7.7 K/uL - - -  LYMPHSABS 0.7 - 4.0 K/uL - - -     Body mass index is 32.71 kg/m.  Orders:  No orders of the defined types were placed in this encounter.  No orders of the defined types were placed in this encounter.    Procedures: No procedures performed  Clinical Data: No additional findings.  ROS:  All other systems negative, except as noted in the HPI. Review of Systems  Objective: Vital Signs: Ht 5\' 10"  (1.778 m)  Wt 228 lb (103.4 kg)   BMI 32.71 kg/m   Specialty Comments:  No specialty comments available.  PMFS History: Patient Active Problem List   Diagnosis Date Noted  . Pain in right elbow 10/30/2018  . Acute kidney injury superimposed on chronic kidney disease ([place]) 10/04/2018  . AKI (acute kidney injury) ([place]) 10/03/2018  . Hypoalbuminemia 10/03/2018  . Hypokalemia 10/03/2018  . Hypothyroidism 10/03/2018  . CKD (chronic kidney disease), stage III ([place])   . Iron deficiency anemia   . Symptomatic anemia 06/29/2016  . Chronic diastolic heart failure ([place]) 01/14/2014  . Stable angina ([place]) 01/14/2014  . Obesity 01/14/2014  . Pulmonary sarcoidosis ([place]) 01/14/2014  . Uncontrolled diabetes mellitus ([place]) 01/14/2014  . Hyperlipidemia 01/14/2014  . Coronary atherosclerosis of native coronary artery   . Sinusitis, chronic  05/30/2011  . Sleep apnea 10/09/2008  . ADRENAL INSUFFICIENCY, HX OF 02/21/2008  . Coronary artery disease 11/20/2007  . Sarcoidosis 02/20/2007  . Diabetes mellitus without complication ([place]) 123XX123  . GERD 12/20/2006   Past Medical History:  Diagnosis Date  . Adrenal insufficiency ([place])   . Allergy   . Anemia   . Arthritis    feet   . CKD (chronic kidney disease), stage III   . Coronary artery disease    has stents  . Coronary atherosclerosis of native coronary artery    Proximal LAD, posterior lateral stent widely patent-10/12/11  . Diabetes mellitus    insulin and pills  . Hearing loss    wears hearing aids  . Heart attack ([place]) 2010  . History of blood transfusion 06/30/2016   [facility] - 2 units transfused  . Hypertension   . OSA (obstructive sleep apnea)    uses VPAC sleep study 2 years done through [facility]. Dr. Marlou Porch arranged study  . Pneumonia    3-4 years ago  . Sarcoid   . Sleep apnea    uses CPAP nightly  . Thyroid disease   . Type 2 diabetes mellitus (HCC)     Family History  Problem Relation Age of Onset  . Kidney disease Mother   . Diabetes Mother   . Kidney cancer Mother   . Heart attack Father   . Asthma Sister   . Anesthesia problems Sister        "Kidney's did not wake up"  . Colon cancer Neg Hx   . Colon polyps Neg Hx   . Rectal cancer Neg Hx   . Stomach cancer Neg Hx     Past Surgical History:  Procedure Laterality Date  . APPENDECTOMY    . CATARACT EXTRACTION  2011   bilat  . CHOLECYSTECTOMY  01/25/2011   Procedure: LAPAROSCOPIC CHOLECYSTECTOMY WITH INTRAOPERATIVE CHOLANGIOGRAM;  Surgeon: Judieth Keens, DO;  Location: [facility];  Service: General;  Laterality: N/A;  . COLONOSCOPY     several  . CORONARY ANGIOPLASTY     most recent 11/2009  . CORONARY STENT INTERVENTION N/A 08/07/2018   Procedure: CORONARY STENT INTERVENTION;  Surgeon: Jettie Booze, MD;  Location: [facility];  Service: Cardiovascular;  Laterality:  N/A;  . CORONARY STENT PLACEMENT  2009   in LAD and side branch PTCA  . INTERCOSTAL NERVE BLOCK  2011, 06/2016   x2. lumbar spine  . LEFT HEART CATHETERIZATION WITH CORONARY ANGIOGRAM Bilateral 10/12/2011   Procedure: LEFT HEART CATHETERIZATION WITH CORONARY ANGIOGRAM;  Surgeon: Candee Furbish, MD;  Location[facility] C[facility] LAB;  Service: Cardiovascular;  Laterality: Bilateral;  . LEFT HEART  CATHETERIZATION WITH CORONARY ANGIOGRAM N/A 04/01/2014   Procedure: LEFT HEART CATHETERIZATION WITH CORONARY ANGIOGRAM;  Surgeon: Candee Furbish, MD;  Location: [facility] CATH LAB;  Service: Cardiovascular;  Laterality: N/A;  . RIGHT/LEFT HEART CATH AND CORONARY ANGIOGRAPHY N/A 08/07/2018   Procedure: RIGHT/LEFT HEART CATH AND CORONARY ANGIOGRAPHY;  Surgeon: Jettie Booze, MD;  Location: [facility];  Service: Cardiovascular;  Laterality: N/A;   Social History   Occupational History  . 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 Smoking status: Never Smoker  . Smokeless tobacco: Never Used  Substance and Sexual Activity  . Alcohol use: No  . Drug use: No  . Sexual activity: Not on file

## 2019-02-13 ENCOUNTER — Encounter (INDEPENDENT_AMBULATORY_CARE_PROVIDER_SITE_OTHER): Payer: Medicare HMO | Admitting: Ophthalmology

## 2019-02-13 DIAGNOSIS — N183 Chronic kidney disease, stage 3 unspecified: Secondary | ICD-10-CM | POA: Diagnosis not present

## 2019-02-28 DIAGNOSIS — Z794 Long term (current) use of insulin: Secondary | ICD-10-CM | POA: Diagnosis not present

## 2019-02-28 DIAGNOSIS — B349 Viral infection, unspecified: Secondary | ICD-10-CM | POA: Diagnosis not present

## 2019-02-28 DIAGNOSIS — E1165 Type 2 diabetes mellitus with hyperglycemia: Secondary | ICD-10-CM | POA: Diagnosis not present

## 2019-02-28 DIAGNOSIS — Z20828 Contact with and (suspected) exposure to other viral communicable diseases: Secondary | ICD-10-CM | POA: Diagnosis not present

## 2019-02-28 DIAGNOSIS — D869 Sarcoidosis, unspecified: Secondary | ICD-10-CM | POA: Diagnosis not present

## 2019-03-11 ENCOUNTER — Ambulatory Visit: Payer: Medicare HMO | Admitting: Orthopedic Surgery

## 2019-03-13 ENCOUNTER — Encounter (INDEPENDENT_AMBULATORY_CARE_PROVIDER_SITE_OTHER): Payer: Medicare HMO | Admitting: Ophthalmology

## 2019-03-13 ENCOUNTER — Other Ambulatory Visit: Payer: Self-pay

## 2019-03-13 DIAGNOSIS — E113311 Type 2 diabetes mellitus with moderate nonproliferative diabetic retinopathy with macular edema, right eye: Secondary | ICD-10-CM

## 2019-03-13 DIAGNOSIS — H35033 Hypertensive retinopathy, bilateral: Secondary | ICD-10-CM

## 2019-03-13 DIAGNOSIS — E11311 Type 2 diabetes mellitus with unspecified diabetic retinopathy with macular edema: Secondary | ICD-10-CM

## 2019-03-13 DIAGNOSIS — I10 Essential (primary) hypertension: Secondary | ICD-10-CM

## 2019-03-13 DIAGNOSIS — H43813 Vitreous degeneration, bilateral: Secondary | ICD-10-CM

## 2019-03-13 DIAGNOSIS — E113512 Type 2 diabetes mellitus with proliferative diabetic retinopathy with macular edema, left eye: Secondary | ICD-10-CM | POA: Diagnosis not present

## 2019-03-20 DIAGNOSIS — E785 Hyperlipidemia, unspecified: Secondary | ICD-10-CM | POA: Diagnosis not present

## 2019-03-20 DIAGNOSIS — E11319 Type 2 diabetes mellitus with unspecified diabetic retinopathy without macular edema: Secondary | ICD-10-CM | POA: Diagnosis not present

## 2019-03-20 DIAGNOSIS — E11621 Type 2 diabetes mellitus with foot ulcer: Secondary | ICD-10-CM | POA: Diagnosis not present

## 2019-03-20 DIAGNOSIS — I2510 Atherosclerotic heart disease of native coronary artery without angina pectoris: Secondary | ICD-10-CM | POA: Diagnosis not present

## 2019-03-20 DIAGNOSIS — E1142 Type 2 diabetes mellitus with diabetic polyneuropathy: Secondary | ICD-10-CM | POA: Diagnosis not present

## 2019-03-20 DIAGNOSIS — D508 Other iron deficiency anemias: Secondary | ICD-10-CM | POA: Diagnosis not present

## 2019-03-20 DIAGNOSIS — E039 Hypothyroidism, unspecified: Secondary | ICD-10-CM | POA: Diagnosis not present

## 2019-03-20 DIAGNOSIS — I10 Essential (primary) hypertension: Secondary | ICD-10-CM | POA: Diagnosis not present

## 2019-03-20 DIAGNOSIS — E1165 Type 2 diabetes mellitus with hyperglycemia: Secondary | ICD-10-CM | POA: Diagnosis not present

## 2019-03-21 DIAGNOSIS — J40 Bronchitis, not specified as acute or chronic: Secondary | ICD-10-CM | POA: Diagnosis not present

## 2019-04-24 ENCOUNTER — Encounter (INDEPENDENT_AMBULATORY_CARE_PROVIDER_SITE_OTHER): Payer: Medicare HMO | Admitting: Ophthalmology

## 2019-04-24 ENCOUNTER — Other Ambulatory Visit: Payer: Self-pay | Admitting: Family Medicine

## 2019-04-24 DIAGNOSIS — E113512 Type 2 diabetes mellitus with proliferative diabetic retinopathy with macular edema, left eye: Secondary | ICD-10-CM

## 2019-04-24 DIAGNOSIS — E113311 Type 2 diabetes mellitus with moderate nonproliferative diabetic retinopathy with macular edema, right eye: Secondary | ICD-10-CM | POA: Diagnosis not present

## 2019-04-24 DIAGNOSIS — H35033 Hypertensive retinopathy, bilateral: Secondary | ICD-10-CM

## 2019-04-24 DIAGNOSIS — E11311 Type 2 diabetes mellitus with unspecified diabetic retinopathy with macular edema: Secondary | ICD-10-CM | POA: Diagnosis not present

## 2019-04-24 DIAGNOSIS — I10 Essential (primary) hypertension: Secondary | ICD-10-CM

## 2019-04-24 DIAGNOSIS — H43813 Vitreous degeneration, bilateral: Secondary | ICD-10-CM | POA: Diagnosis not present

## 2019-04-24 DIAGNOSIS — R911 Solitary pulmonary nodule: Secondary | ICD-10-CM

## 2019-05-03 DIAGNOSIS — R911 Solitary pulmonary nodule: Secondary | ICD-10-CM | POA: Diagnosis not present

## 2019-05-03 DIAGNOSIS — N183 Chronic kidney disease, stage 3 unspecified: Secondary | ICD-10-CM | POA: Diagnosis not present

## 2019-05-03 DIAGNOSIS — E039 Hypothyroidism, unspecified: Secondary | ICD-10-CM | POA: Diagnosis not present

## 2019-05-03 DIAGNOSIS — E1122 Type 2 diabetes mellitus with diabetic chronic kidney disease: Secondary | ICD-10-CM | POA: Diagnosis not present

## 2019-05-03 DIAGNOSIS — Z794 Long term (current) use of insulin: Secondary | ICD-10-CM | POA: Diagnosis not present

## 2019-05-03 DIAGNOSIS — E1142 Type 2 diabetes mellitus with diabetic polyneuropathy: Secondary | ICD-10-CM | POA: Diagnosis not present

## 2019-05-03 DIAGNOSIS — D860 Sarcoidosis of lung: Secondary | ICD-10-CM | POA: Diagnosis not present

## 2019-05-03 DIAGNOSIS — I2510 Atherosclerotic heart disease of native coronary artery without angina pectoris: Secondary | ICD-10-CM | POA: Diagnosis not present

## 2019-05-03 DIAGNOSIS — D869 Sarcoidosis, unspecified: Secondary | ICD-10-CM | POA: Diagnosis not present

## 2019-05-03 DIAGNOSIS — E7800 Pure hypercholesterolemia, unspecified: Secondary | ICD-10-CM | POA: Diagnosis not present

## 2019-05-03 DIAGNOSIS — I10 Essential (primary) hypertension: Secondary | ICD-10-CM | POA: Diagnosis not present

## 2019-05-06 ENCOUNTER — Ambulatory Visit
Admission: RE | Admit: 2019-05-06 | Discharge: 2019-05-06 | Disposition: A | Discharge status: Home / Self Care | Payer: Medicare HMO | Source: Ambulatory Visit | Attending: Family Medicine | Admitting: Family Medicine

## 2019-05-06 DIAGNOSIS — R911 Solitary pulmonary nodule: Secondary | ICD-10-CM

## 2019-05-06 DIAGNOSIS — R918 Other nonspecific abnormal finding of lung field: Secondary | ICD-10-CM | POA: Diagnosis not present

## 2019-05-06 IMAGING — CT CT CHEST W/O CM
2 of 4 series · 11 of 36 positions shown, 13 images · non-contrast
Comparison: [DATE]

CLINICAL DATA: Follow-up lung nodules, history of sarcoidosis.

EXAM:
CT CHEST WITHOUT CONTRAST
TECHNIQUE: Multidetector CT imaging of the chest was performed following the
standard protocol without IV contrast.

[Series 2: chest 2.00 br40 s3 · axial · 0.65mm/px · z∈[+1698,+1970]mm · 8 of 162 slices shown, 10 images (1 of 2)]
[im 13/162  mediastinal]
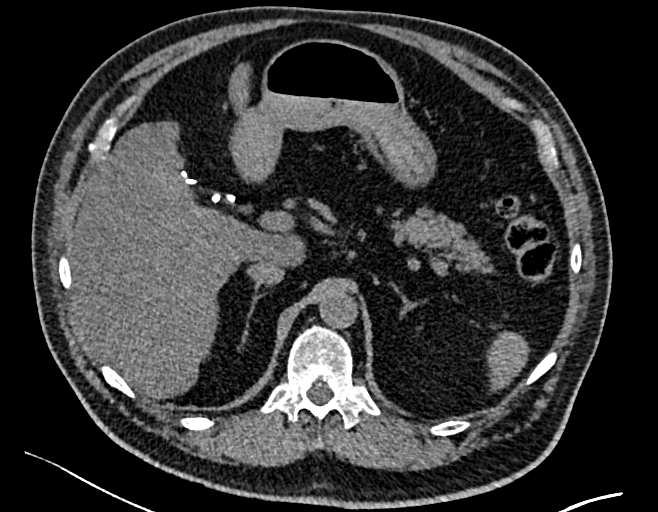
[im 13/162  lung]
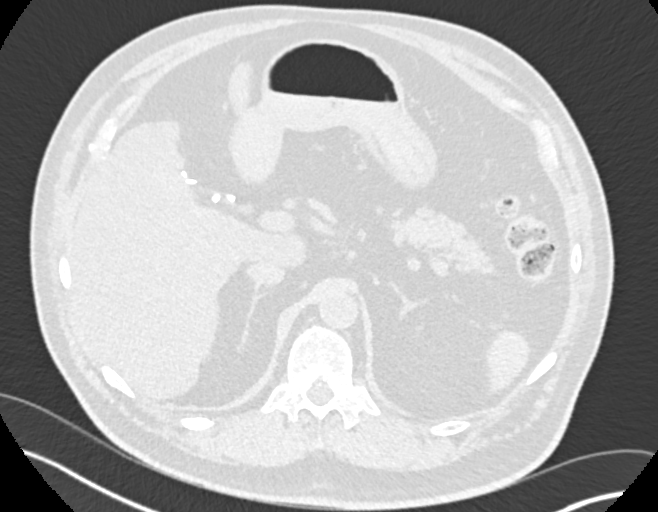
[im 38/162  lung]
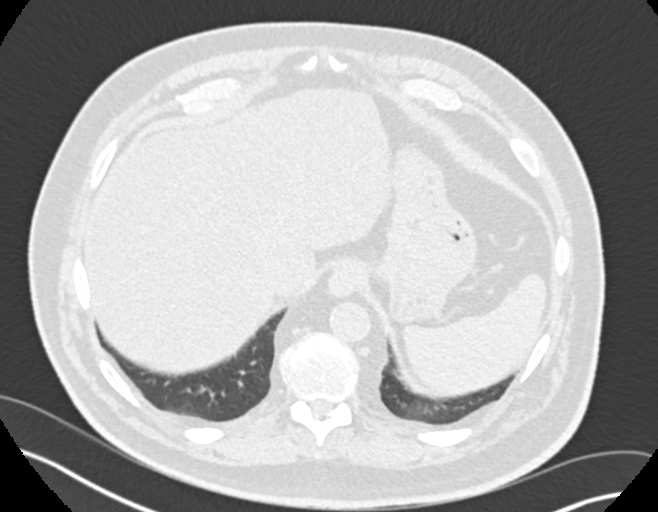
[im 50/162  lung]
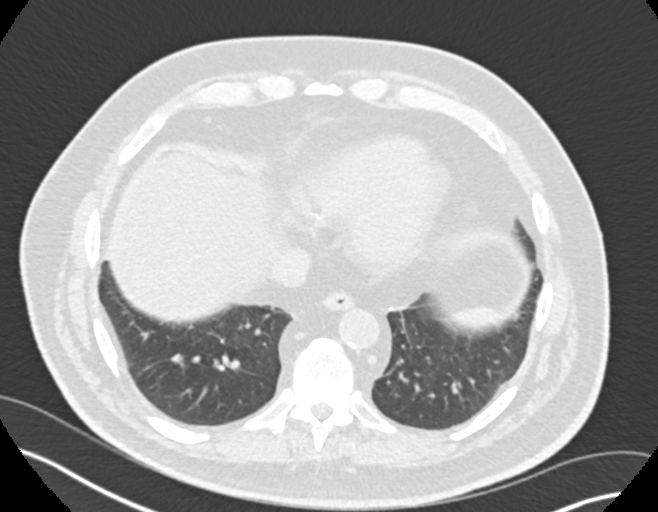
[im 75/162  lung]
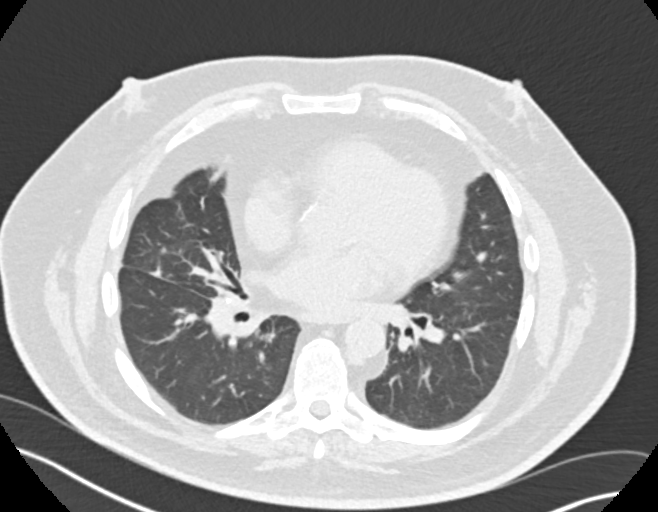
[im 87/162  mediastinal]
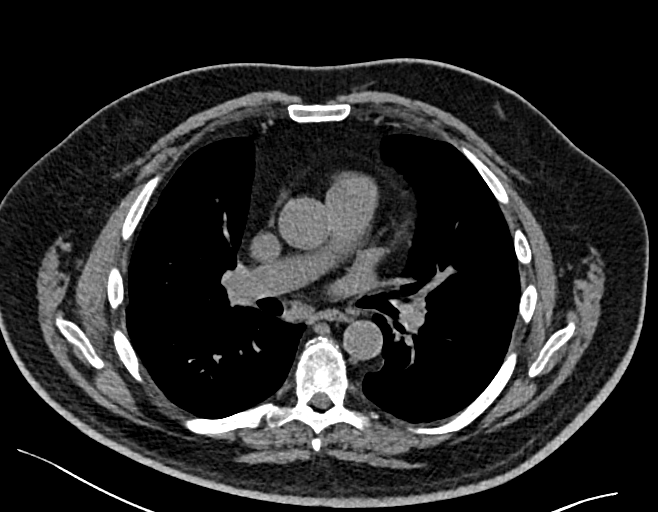
[im 87/162  lung]
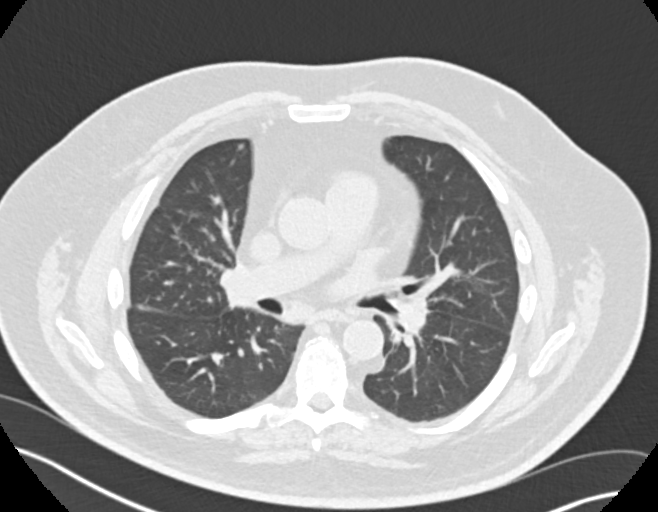
[im 112/162  lung]
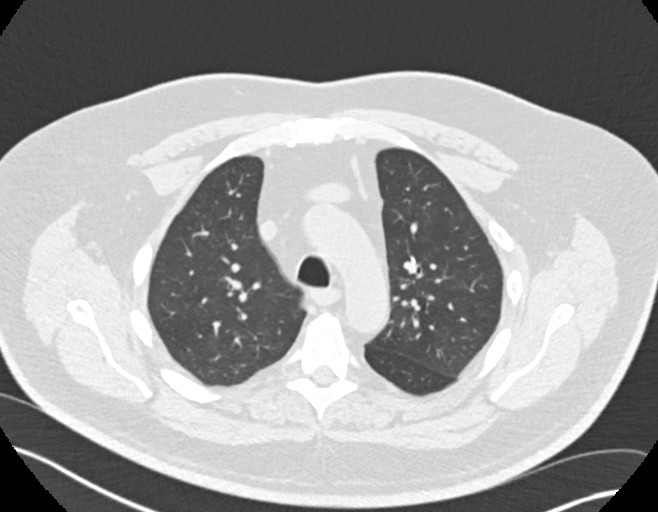
[im 124/162  lung]
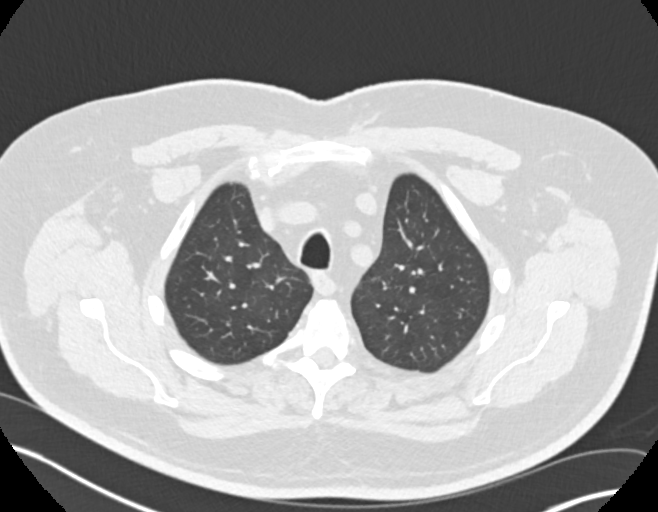
[im 149/162  lung]
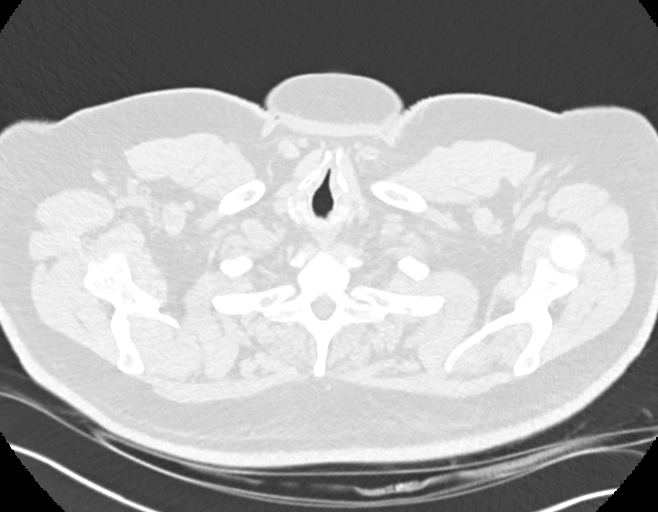

[Series 4: chest 2.00 br40 s3 · coronal · 0.63mm/px · 3 of 166 slices shown (2 of 2)]
[im 34/166  lung]
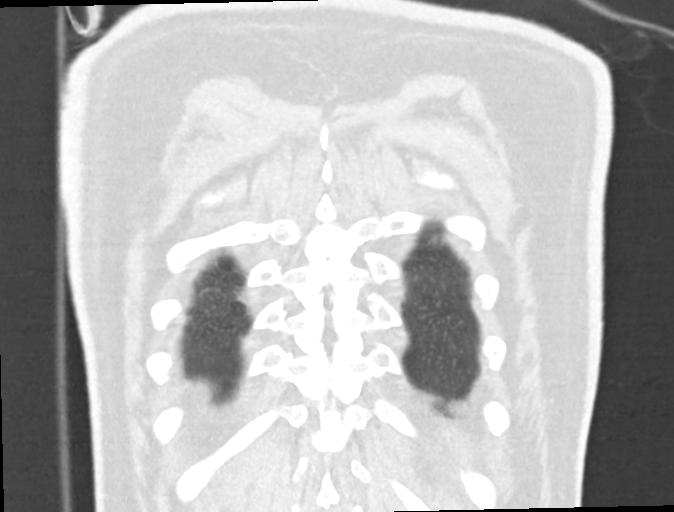
[im 67/166  lung]
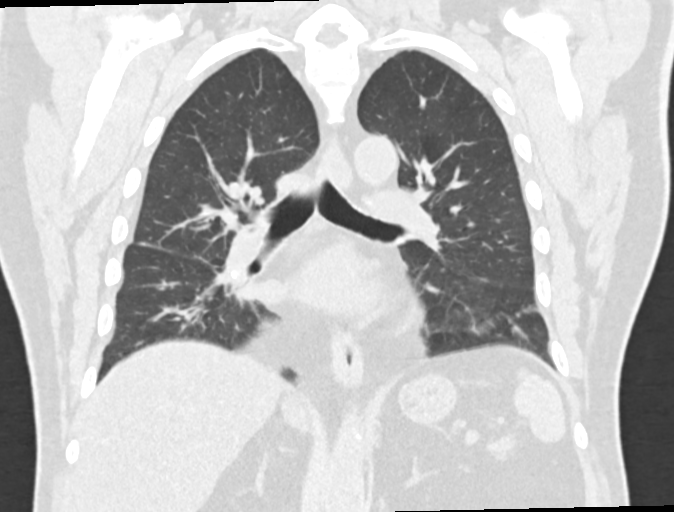
[im 100/166  lung]
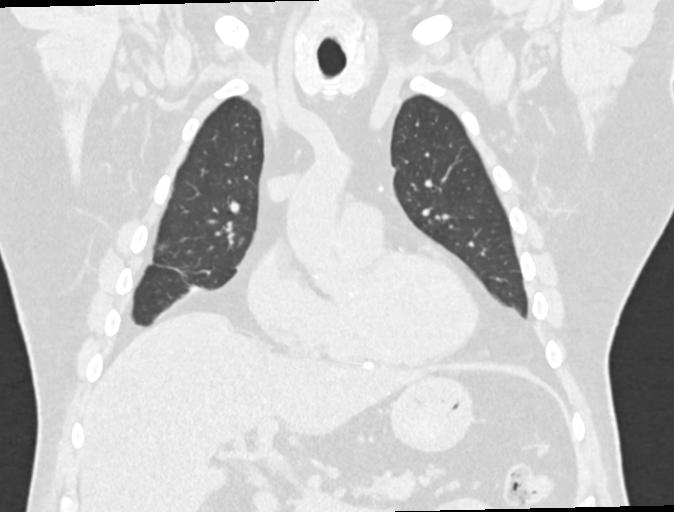

[11 of 36 positions shown; findings below may reference images not displayed]

FINDINGS: Cardiovascular: Somewhat limited due to lack of IV contrast.
Scattered atherosclerotic calcifications are noted within the aorta
and its branches without aneurysmal dilatation. No cardiac
enlargement is seen. Coronary calcifications are seen. The pulmonary
artery as visualized is within normal limits.

Mediastinum/Nodes: Thoracic inlet is unremarkable. No sizable hilar
or mediastinal adenopathy is noted. A few scattered lymph nodes are
noted primarily within the hila consistent with the known history of
sarcoidosis. The esophagus as visualized is within normal limits.

Lungs/Pleura: Lungs are again well aerated. Stable right middle lobe
scarring is noted consistent with the given clinical history of
sarcoidosis. Scattered small nodules are noted primarily within the
right lobe but to a lesser degree within the left lobe similar to
that seen on the prior exam. Some of these are at least partially
calcified and are consistent with the given clinical history of
sarcoidosis. No new pulmonary nodules are identified. No sizable
effusion is noted.

Upper Abdomen: Visualized upper abdomen shows changes of prior
cholecystectomy. Mild fatty infiltration of the liver is seen
without focal mass.

Musculoskeletal: Bony structures show degenerative change of the
thoracic spine. No acute abnormality is noted. No compression
deformities are seen.
IMPRESSION: Chronic changes consistent with the given clinical history of
sarcoidosis. Persistent right little lobe scarring is seen.

Multiple small lung nodule stable from the prior exam as well as a
prior exam dating back to [UI]. Given this long-term stability they
are felt to be benign in etiology and no further follow-up is
recommended.

No acute abnormality is noted.

Aortic Atherosclerosis ([UI]-[UI]).

## 2019-05-29 ENCOUNTER — Other Ambulatory Visit: Payer: Self-pay

## 2019-05-29 ENCOUNTER — Encounter (INDEPENDENT_AMBULATORY_CARE_PROVIDER_SITE_OTHER): Payer: Medicare HMO | Admitting: Ophthalmology

## 2019-05-29 DIAGNOSIS — E113311 Type 2 diabetes mellitus with moderate nonproliferative diabetic retinopathy with macular edema, right eye: Secondary | ICD-10-CM | POA: Diagnosis not present

## 2019-05-29 DIAGNOSIS — H35033 Hypertensive retinopathy, bilateral: Secondary | ICD-10-CM

## 2019-05-29 DIAGNOSIS — E113512 Type 2 diabetes mellitus with proliferative diabetic retinopathy with macular edema, left eye: Secondary | ICD-10-CM

## 2019-05-29 DIAGNOSIS — I10 Essential (primary) hypertension: Secondary | ICD-10-CM | POA: Diagnosis not present

## 2019-05-29 DIAGNOSIS — H43813 Vitreous degeneration, bilateral: Secondary | ICD-10-CM

## 2019-05-29 DIAGNOSIS — E11311 Type 2 diabetes mellitus with unspecified diabetic retinopathy with macular edema: Secondary | ICD-10-CM | POA: Diagnosis not present

## 2019-06-04 DIAGNOSIS — E039 Hypothyroidism, unspecified: Secondary | ICD-10-CM | POA: Diagnosis not present

## 2019-06-04 DIAGNOSIS — D508 Other iron deficiency anemias: Secondary | ICD-10-CM | POA: Diagnosis not present

## 2019-06-04 DIAGNOSIS — E11621 Type 2 diabetes mellitus with foot ulcer: Secondary | ICD-10-CM | POA: Diagnosis not present

## 2019-06-04 DIAGNOSIS — E785 Hyperlipidemia, unspecified: Secondary | ICD-10-CM | POA: Diagnosis not present

## 2019-06-04 DIAGNOSIS — I10 Essential (primary) hypertension: Secondary | ICD-10-CM | POA: Diagnosis not present

## 2019-06-04 DIAGNOSIS — I2510 Atherosclerotic heart disease of native coronary artery without angina pectoris: Secondary | ICD-10-CM | POA: Diagnosis not present

## 2019-06-04 DIAGNOSIS — E1142 Type 2 diabetes mellitus with diabetic polyneuropathy: Secondary | ICD-10-CM | POA: Diagnosis not present

## 2019-06-04 DIAGNOSIS — E11319 Type 2 diabetes mellitus with unspecified diabetic retinopathy without macular edema: Secondary | ICD-10-CM | POA: Diagnosis not present

## 2019-06-04 DIAGNOSIS — E1165 Type 2 diabetes mellitus with hyperglycemia: Secondary | ICD-10-CM | POA: Diagnosis not present

## 2019-06-20 ENCOUNTER — Other Ambulatory Visit: Payer: Self-pay

## 2019-06-20 ENCOUNTER — Ambulatory Visit: Payer: Medicare HMO | Admitting: Cardiology

## 2019-06-20 VITALS — BP 136/64 | HR 79 | Ht 70.5 in | Wt 236.0 lb

## 2019-06-20 DIAGNOSIS — I5032 Chronic diastolic (congestive) heart failure: Secondary | ICD-10-CM | POA: Diagnosis not present

## 2019-06-20 DIAGNOSIS — N179 Acute kidney failure, unspecified: Secondary | ICD-10-CM

## 2019-06-20 DIAGNOSIS — M79606 Pain in leg, unspecified: Secondary | ICD-10-CM | POA: Diagnosis not present

## 2019-06-20 DIAGNOSIS — I2510 Atherosclerotic heart disease of native coronary artery without angina pectoris: Secondary | ICD-10-CM

## 2019-06-20 NOTE — Progress Notes (Signed)
Cardiology Office Note:    Date:  06/20/2019   ID:  Noey, Steines 1945/08/01, MRN [id number]  PCP:  Shirline Frees, MD  Cardiologist:  Candee Furbish, MD  Electrophysiologist:  None   Referring MD: Shirline Frees, MD     History of Present Illness:    [name] is a 74 y.o. male here for the follow-up of coronary artery disease.  Has chronic kidney disease stage III of adrenal insufficiency on chronic steroids.  On 08/07/2018 had left and right heart catheterization.  Stents were placed in the distal right coronary artery PDA region.  Right heart cath was normal.  He has had AKI in the past.  Bend over dizzy. Lower leg pain especially when walking.   Recently went [place] enjoyed with his grandkids.  Snow tubing.  Indoor   Past Medical History:  Diagnosis Date  . Adrenal insufficiency ([place])   . Allergy   . Anemia   . Arthritis    feet   . CKD (chronic kidney disease), stage III   . Coronary artery disease    has stents  . Coronary atherosclerosis of native coronary artery    Proximal LAD, posterior lateral stent widely patent-10/12/11  . Diabetes mellitus    insulin and pills  . Hearing loss    wears hearing aids  . Heart attack ([place]) 2010  . History of blood transfusion 06/30/2016   [facility] - 2 units transfused  . Hypertension   . OSA (obstructive sleep apnea)    uses VPAC sleep study 2 years done through [facility]. Dr. Marlou Porch arranged study  . Pneumonia    3-4 years ago  . Sarcoid   . Sleep apnea    uses CPAP nightly  . Thyroid disease   . Type 2 diabetes mellitus ([place])     Past Surgical History:  Procedure Laterality Date  . APPENDECTOMY    . CATARACT EXTRACTION  2011   bilat  . CHOLECYSTECTOMY  01/25/2011   Procedure: LAPAROSCOPIC CHOLECYSTECTOMY WITH INTRAOPERATIVE CHOLANGIOGRAM;  Surgeon: Judieth Keens, DO;  Location: [facility];  Service: General;  Laterality: N/A;  . COLONOSCOPY     several  . CORONARY ANGIOPLASTY     most recent  11/2009  . CORONARY STENT INTERVENTION N/A 08/07/2018   Procedure: CORONARY STENT INTERVENTION;  Surgeon: Jettie Booze, MD;  Location: [facility];  Service: Cardiovascular;  Laterality: N/A;  . CORONARY STENT PLACEMENT  2009   in LAD and side branch PTCA  . INTERCOSTAL NERVE BLOCK  2011, 06/2016   x2. lumbar spine  . LEFT HEART CATHETERIZATION WITH CORONARY ANGIOGRAM Bilateral 10/12/2011   Procedure: LEFT HEART CATHETERIZATION WITH CORONARY ANGIOGRAM;  Surgeon: Candee Furbish, MD;  Location: [facility] CATH LAB;  Service: Cardiovascular;  Laterality: Bilateral;  . LEFT HEART CATHETERIZATION WITH CORONARY ANGIOGRAM N/A 04/01/2014   Procedure: LEFT HEART CATHETERIZATION WITH CORONARY ANGIOGRAM;  Surgeon: Candee Furbish, MD;  Location: [facility] CATH LAB;  Service: Cardiovascular;  Laterality: N/A;  . RIGHT/LEFT HEART CATH AND CORONARY ANGIOGRAPHY N/A 08/07/2018   Procedure: RIGHT/LEFT HEART CATH AND CORONARY ANGIOGRAPHY;  Surgeon: Jettie Booze, MD;  Location: [facility];  Service: Cardiovascular;  Laterality: N/A;    Current Medications: Current Meds  Medication Sig  . acetaminophen (TYLENOL) 500 MG tablet Take 500 mg by mouth daily as needed for moderate pain or headache.   . albuterol (PROVENTIL) (2.5 MG/3ML) 0.083% nebulizer solution Take 3 mLs (2.5 mg total) by  nebulization every 6 (six) hours as needed for wheezing or shortness of breath. Dx Code D86.0  . Albuterol Sulfate (PROAIR RESPICLICK) 123XX123 (90 BASE) MCG/ACT AEPB Inhale 2 puffs into the lungs every 6 (six) hours as needed.  Marland Kitchen allopurinol (ZYLOPRIM) 100 MG tablet TAKE 1 TABLET BY MOUTH TWICE A DAY  . aspirin EC 81 MG tablet Take 81 mg by mouth every evening.   . Cholecalciferol (VITAMIN D3) 3000 UNITS TABS Take 3,000 Units by mouth daily.  . clopidogrel (PLAVIX) 75 MG tablet Take 1 tablet (75 mg total) by mouth daily.  . Coenzyme Q10 (COQ-10) 400 MG CAPS Take 400 mg by mouth every evening.  . Colchicine 0.6 MG CAPS Take 1 tablet  by mouth 2 (two) times daily as needed.  . doxycycline (VIBRA-TABS) 100 MG tablet Take 1 tablet (100 mg total) by mouth 2 (two) times daily.  . DROPLET INSULIN SYRINGE 31G X 5/16" 1 ML MISC   . fish oil-omega-3 fatty acids 1000 MG capsule Take 1 g by mouth daily.   . furosemide (LASIX) 80 MG tablet Take 1 tablet (80 mg total) by mouth daily.  Marland Kitchen glipiZIDE (GLUCOTROL) 10 MG tablet Take 10 mg by mouth every evening.   . insulin aspart protamine- aspart (NOVOLOG MIX 70/30) (70-30) 100 UNIT/ML injection Inject 0.3 mLs (30 Units total) into the skin 2 (two) times daily with a meal.  . levothyroxine (SYNTHROID, LEVOTHROID) 50 MCG tablet Take 50 mcg by mouth daily.   . montelukast (SINGULAIR) 10 MG tablet Take 10 mg by mouth daily as needed (allergies).   . Multiple Vitamin (MULTIVITAMIN WITH MINERALS) TABS tablet Take 1 tablet by mouth daily.  . pantoprazole (PROTONIX) 40 MG tablet Take 40 mg by mouth daily.  . predniSONE (DELTASONE) 10 MG tablet Take 1 tablet (10 mg total) by mouth daily with breakfast. T  . rosuvastatin (CRESTOR) 10 MG tablet Take 1 tablet (10 mg total) by mouth at bedtime.  . sodium chloride (OCEAN) 0.65 % SOLN nasal spray Place 1 spray into both nostrils as needed for congestion.  . Vitamin D, Ergocalciferol, (DRISDOL) 50000 UNITS CAPS capsule Take 50,000 Units by mouth every Sunday.      Allergies:   Hydrocortisone, Metolazone, and Statins   Social History   Socioeconomic History  . Marital status: Married    Spouse name: Not on file  . Number of children: 3  . Years of education: Not on file  . Highest education level: Not on file  Occupational History  . 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 Smoking status: Never Smoker  . Smokeless tobacco: Never Used  Substance and Sexual Activity  . Alcohol use: No  . Drug use: No  . Sexual activity: Not on file  Other Topics Concern  . Not on  file  Social History Narrative  . Not on file   Social Determinants of Health   Financial Resource Strain:   . Difficulty of Paying Living Expenses:   Food Insecurity:   . Worried About Charity fundraiser in the Last Year:   . Arboriculturist in the Last Year:   Transportation Needs:   . Film/video editor (Medical):   Marland Kitchen Lack of Transportation (Non-Medical):   Physical Activity:   . Days of Exercise per Week:   . Minutes of Exercise per Session:   Stress:   . Feeling of Stress :  Social Connections:   . Frequency of Communication with Friends and Family:   . Frequency of Social Gatherings with Friends and Family:   . Attends Religious Services:   . Active Member of Clubs or Organizations:   . Attends Archivist Meetings:   Marland Kitchen Marital Status:    Recent Labs: 07/27/2018: NT-Pro BNP 124 10/03/2018: B Natriuretic Peptide 42.4; TSH 2.678 10/04/2018: ALT 14 10/06/2018: Magnesium 1.7 10/07/2018: BUN 27; Creatinine, Ser 2.35; Hemoglobin 10.2; Platelets 244; Potassium 3.5; Sodium 141  Recent Lipid Panel    Component Value Date/Time   CHOL 219 (H) 07/27/2018 1239   TRIG 269 (H) 07/27/2018 1239   HDL 32 (L) 07/27/2018 1239   CHOLHDL 6.8 (H) 07/27/2018 1239   CHOLHDL 11.4 08/15/2008 0408   VLDL 41 (H) 08/15/2008 0408   LDLCALC 133 (H) 07/27/2018 1239    Physical Exam:    VS:  BP 136/64   Pulse 79   Ht 5' 10.5" (1.791 m)   Wt 236 lb (107 kg)   SpO2 98%   BMI 33.38 kg/m     Wt Readings from Last 3 Encounters:  06/20/19 236 lb (107 kg)  02/11/19 228 lb (103.4 kg)  02/04/19 228 lb (103.4 kg)     GEN:  Well nourished, well developed in no acute distress HEENT: Normal NECK: No JVD; No carotid bruits LYMPHATICS: No lymphadenopathy CARDIAC: RRR, no murmurs, rubs, gallops RESPIRATORY:  Clear to auscultation without rales, wheezing or rhonchi  ABDOMEN: Soft, non-tender, non-distended MUSCULOSKELETAL:  1-2+ BLE edema; No deformity  SKIN: Warm and dry NEUROLOGIC:   Alert and oriented x 3 PSYCHIATRIC:  Normal affect   ASSESSMENT:    1. Pain of lower extremity, unspecified laterality   2. Atherosclerosis of native coronary artery of native heart without angina pectoris   3. Chronic diastolic heart failure ([place])   4. AKI (acute kidney injury) ([place])    PLAN:    In order of problems listed above:  Lower leg pain with activity/claudication -We will check lower extremity ABI/vascular arterial studies to make sure that there is no evidence of lower extremity stenosis.  Coronary artery disease -Currently stable no anginal symptoms. -Stents placed in the distal RCA distribution PDA in June 2020. -Continue with dual antiplatelet therapy for now.  CKD 3 -Prior AKI likely secondary to overuse of diuretics.  Careful with Lasix.  Currently taking 80 once a day.  Last creatinine 1.4, hemoglobin 10.2  Hyperlipidemia -LDL 174 on 04/09/2018 continue to try to take Crestor 10 mg once a day.  He has had trouble with statins in the past.  At some point we need to discuss the possibility of PCSK9 inhibitor.   Medication Adjustments/Labs and Tests Ordered: Current medicines are reviewed at length with the patient today.  Concerns regarding medicines are outlined above.  Orders Placed This Encounter  Procedures  . VAS [place] LOWER EXTREMITY ARTERIAL DUPLEX   No orders of the defined types were placed in this encounter.   Patient Instructions  Medication Instructions:  The current medical regimen is effective;  continue present plan and medications.  *If you need a refill on your cardiac medications before your next appointment, please call your pharmacy*  Testing/Procedures: Your physician has requested that you have a lower extremity arterial exercise duplex. During this test, exercise and ultrasound are used to evaluate arterial blood flow in the legs. Allow one hour for this exam. There are no restrictions or special instructions. This test is completed at  our  [facility] office - 2nd floor.  Follow-Up: At [facility], you and your health needs are our priority.  As part of our continuing mission to provide you with exceptional heart care, we have created designated Provider Care Teams.  These Care Teams include your primary Cardiologist (physician) and Advanced Practice Providers (APPs -  Physician Assistants and Nurse Practitioners) who all work together to provide you with the care you need, when you need it.  We recommend signing up for the patient portal called "MyChart".  Sign up information is provided on this After Visit Summary.  MyChart is used to connect with patients for Virtual Visits (Telemedicine).  Patients are able to view lab/test results, encounter notes, upcoming appointments, etc.  Non-urgent messages can be sent to your provider as well.   To learn more about what you can do with MyChart, go to [email or web address].    Your next appointment:   6 month(s)  The format for your next appointment:   In Person  Provider:   Candee Furbish, MD   Thank you for choosing [facility]!!        Signed, Candee Furbish, MD  06/20/2019 5:23 PM    [place]

## 2019-06-20 NOTE — Patient Instructions (Addendum)
Medication Instructions:  The current medical regimen is effective;  continue present plan and medications.  *If you need a refill on your cardiac medications before your next appointment, please call your pharmacy*  Testing/Procedures: Your physician has requested that you have a lower extremity arterial exercise duplex. During this test, exercise and ultrasound are used to evaluate arterial blood flow in the legs. Allow one hour for this exam. There are no restrictions or special instructions. This test is completed at our [facility] office - 2nd floor.  Follow-Up: At [facility], you and your health needs are our priority.  As part of our continuing mission to provide you with exceptional heart care, we have created designated Provider Care Teams.  These Care Teams include your primary Cardiologist (physician) and Advanced Practice Providers (APPs -  Physician Assistants and Nurse Practitioners) who all work together to provide you with the care you need, when you need it.  We recommend signing up for the patient portal called "MyChart".  Sign up information is provided on this After Visit Summary.  MyChart is used to connect with patients for Virtual Visits (Telemedicine).  Patients are able to view lab/test results, encounter notes, upcoming appointments, etc.  Non-urgent messages can be sent to your provider as well.   To learn more about what you can do with MyChart, go to [email or web address].    Your next appointment:   6 month(s)  The format for your next appointment:   In Person  Provider:   Candee Furbish, MD   Thank you for choosing [facility]!!

## 2019-06-26 ENCOUNTER — Other Ambulatory Visit: Payer: Self-pay | Admitting: Cardiology

## 2019-06-26 DIAGNOSIS — I739 Peripheral vascular disease, unspecified: Secondary | ICD-10-CM

## 2019-07-03 ENCOUNTER — Other Ambulatory Visit: Payer: Self-pay

## 2019-07-03 ENCOUNTER — Encounter (INDEPENDENT_AMBULATORY_CARE_PROVIDER_SITE_OTHER): Payer: Medicare HMO | Admitting: Ophthalmology

## 2019-07-03 DIAGNOSIS — H35033 Hypertensive retinopathy, bilateral: Secondary | ICD-10-CM | POA: Diagnosis not present

## 2019-07-03 DIAGNOSIS — H43813 Vitreous degeneration, bilateral: Secondary | ICD-10-CM | POA: Diagnosis not present

## 2019-07-03 DIAGNOSIS — I10 Essential (primary) hypertension: Secondary | ICD-10-CM | POA: Diagnosis not present

## 2019-07-03 DIAGNOSIS — E113311 Type 2 diabetes mellitus with moderate nonproliferative diabetic retinopathy with macular edema, right eye: Secondary | ICD-10-CM | POA: Diagnosis not present

## 2019-07-03 DIAGNOSIS — E113512 Type 2 diabetes mellitus with proliferative diabetic retinopathy with macular edema, left eye: Secondary | ICD-10-CM

## 2019-07-03 DIAGNOSIS — E11311 Type 2 diabetes mellitus with unspecified diabetic retinopathy with macular edema: Secondary | ICD-10-CM | POA: Diagnosis not present

## 2019-07-04 ENCOUNTER — Ambulatory Visit (HOSPITAL_COMMUNITY)
Admission: RE | Admit: 2019-07-04 | Discharge: 2019-07-04 | Disposition: A | Discharge status: Home / Self Care | Payer: Medicare HMO | Source: Ambulatory Visit | Attending: Cardiology | Admitting: Cardiology

## 2019-07-04 DIAGNOSIS — I739 Peripheral vascular disease, unspecified: Secondary | ICD-10-CM | POA: Diagnosis not present

## 2019-07-08 ENCOUNTER — Telehealth: Payer: Self-pay | Admitting: *Deleted

## 2019-07-08 ENCOUNTER — Other Ambulatory Visit: Payer: Self-pay | Admitting: Cardiology

## 2019-07-08 DIAGNOSIS — I739 Peripheral vascular disease, unspecified: Secondary | ICD-10-CM

## 2019-07-08 DIAGNOSIS — M79606 Pain in leg, unspecified: Secondary | ICD-10-CM

## 2019-07-08 NOTE — Telephone Encounter (Signed)
Left leg shows evidence of arterial disease. Please have him see Dr. Gwenlyn Found in consult.  Candee Furbish, MD  Pt is aware of results and that he will receive a call to be scheduled.  All questions, if any were answered at the time of the call.

## 2019-07-12 ENCOUNTER — Ambulatory Visit: Payer: Medicare HMO | Admitting: Cardiovascular Disease

## 2019-07-12 ENCOUNTER — Other Ambulatory Visit: Payer: Self-pay

## 2019-07-12 ENCOUNTER — Encounter: Payer: Self-pay | Admitting: Cardiovascular Disease

## 2019-07-12 VITALS — BP 120/54 | HR 60 | Ht 70.0 in | Wt 237.0 lb

## 2019-07-12 DIAGNOSIS — I739 Peripheral vascular disease, unspecified: Secondary | ICD-10-CM

## 2019-07-12 NOTE — Patient Instructions (Signed)
Medication Instructions:  NO CHANGE *If you need a refill on your cardiac medications before your next appointment, please call your pharmacy*   Lab Work: If you have labs (blood work) drawn today and your tests are completely normal, you will receive your results only by: . MyChart Message (if you have MyChart) OR . A paper copy in the mail If you have any lab test that is abnormal or we need to change your treatment, we will call you to review the results.   Follow-Up: At [facility], you and your health needs are our priority.  As part of our continuing mission to provide you with exceptional heart care, we have created designated Provider Care Teams.  These Care Teams include your primary Cardiologist (physician) and Advanced Practice Providers (APPs -  Physician Assistants and Nurse Practitioners) who all work together to provide you with the care you need, when you need it.  We recommend signing up for the patient portal called "MyChart".  Sign up information is provided on this After Visit Summary.  MyChart is used to connect with patients for Virtual Visits (Telemedicine).  Patients are able to view lab/test results, encounter notes, upcoming appointments, etc.  Non-urgent messages can be sent to your provider as well.   To learn more about what you can do with MyChart, go to [email or web address].    Your next appointment:    AS NEEDED 

## 2019-07-12 NOTE — Progress Notes (Signed)
07/12/2019 [name]   [birth year]  [id number]  Primary Physician Shirline Frees, MD Primary Cardiologist: Lorretta Harp MD Lupe Carney, [place]  HPI:  [name] is a 74 y.o. moderately overweight married Caucasian male father 2, grandfather for grandchildren who is retired from being a Lexicographer over trucks in a warehouse.  He was referred by Dr. Marlou Porch for peripheral arterial disease evaluation and management.  He has a history of treated hypertension, diabetes and hyperlipidemia.  There is a family history of heart disease in the father who died of a myocardial infarction at age 41 and his sisters had bypass surgery.  He has had a remote heart attack but never had a stroke.  Does complain of dyspnea.  He has had several stents in his heart most recently by Dr. Irish Lack 08/07/2018 where he had 2 stents placed in his PDA.  He had normal LV function.  He also has sleep apnea on CPAP.  He complains of some heaviness in his legs when he is standing for long periods but denies claudication.   Current Meds  Medication Sig  . acetaminophen (TYLENOL) 500 MG tablet Take 500 mg by mouth daily as needed for moderate pain or headache.   . albuterol (PROVENTIL) (2.5 MG/3ML) 0.083% nebulizer solution Take 3 mLs (2.5 mg total) by nebulization every 6 (six) hours as needed for wheezing or shortness of breath. Dx Code D86.0  . Albuterol Sulfate (PROAIR RESPICLICK) 123XX123 (90 BASE) MCG/ACT AEPB Inhale 2 puffs into the lungs every 6 (six) hours as needed.  Marland Kitchen allopurinol (ZYLOPRIM) 100 MG tablet TAKE 1 TABLET BY MOUTH TWICE A DAY  . aspirin EC 81 MG tablet Take 81 mg by mouth every evening.   . Cholecalciferol (VITAMIN D3) 3000 UNITS TABS Take 3,000 Units by mouth daily.  . clopidogrel (PLAVIX) 75 MG tablet Take 1 tablet (75 mg total) by mouth daily.  . Coenzyme Q10 (COQ-10) 400 MG CAPS Take 400 mg by mouth every evening.  . Colchicine 0.6 MG CAPS Take 1 tablet by mouth 2 (two) times daily as  needed.  . doxycycline (VIBRA-TABS) 100 MG tablet Take 1 tablet (100 mg total) by mouth 2 (two) times daily.  . DROPLET INSULIN SYRINGE 31G X 5/16" 1 ML MISC   . fish oil-omega-3 fatty acids 1000 MG capsule Take 1 g by mouth daily.   . furosemide (LASIX) 80 MG tablet Take 1 tablet (80 mg total) by mouth daily.  Marland Kitchen glipiZIDE (GLUCOTROL) 10 MG tablet Take 10 mg by mouth every evening.   . insulin aspart protamine- aspart (NOVOLOG MIX 70/30) (70-30) 100 UNIT/ML injection Inject 0.3 mLs (30 Units total) into the skin 2 (two) times daily with a meal.  . levothyroxine (SYNTHROID, LEVOTHROID) 50 MCG tablet Take 50 mcg by mouth daily.   . montelukast (SINGULAIR) 10 MG tablet Take 10 mg by mouth daily as needed (allergies).   . Multiple Vitamin (MULTIVITAMIN WITH MINERALS) TABS tablet Take 1 tablet by mouth daily.  . pantoprazole (PROTONIX) 40 MG tablet Take 40 mg by mouth daily.  . predniSONE (DELTASONE) 10 MG tablet Take 1 tablet (10 mg total) by mouth daily with breakfast. T  . rosuvastatin (CRESTOR) 10 MG tablet TAKE 1 TABLET AT BEDTIME  . sodium chloride (OCEAN) 0.65 % SOLN nasal spray Place 1 spray into both nostrils as needed for congestion.  . Vitamin D, Ergocalciferol, (DRISDOL) 50000 UNITS CAPS capsule Take 50,000 Units by mouth every Sunday.  Allergies  Allergen Reactions  . Hydrocortisone Nausea Only  . Metolazone     Drained, no energy  . Statins Other (See Comments)    Pt says they make him "mean"    Social History   Socioeconomic History  . Marital status: Married    Spouse name: Not on file  . Number of children: 3  . Years of education: Not on file  . Highest education level: Not on file  Occupational History  . 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 Smoking status: Never Smoker  . Smokeless tobacco: Never Used  Substance and Sexual Activity  . Alcohol use: No  . Drug use: No  .  Sexual activity: Not on file  Other Topics Concern  . Not on file  Social History Narrative  . Not on file   Social Determinants of Health   Financial Resource Strain:   . Difficulty of Paying Living Expenses:   Food Insecurity:   . Worried About Charity fundraiser in the Last Year:   . Arboriculturist in the Last Year:   Transportation Needs:   . Film/video editor (Medical):   Marland Kitchen Lack of Transportation (Non-Medical):   Physical Activity:   . Days of Exercise per Week:   . Minutes of Exercise per Session:   Stress:   . Feeling of Stress :   Social Connections:   . Frequency of Communication with Friends and Family:   . Frequency of Social Gatherings with Friends and Family:   . Attends Religious Services:   . Active Member of Clubs or Organizations:   . Attends Archivist Meetings:   Marland Kitchen Marital Status:   Intimate Partner Violence:   . Fear of Current or Ex-Partner:   . Emotionally Abused:   Marland Kitchen Physically Abused:   . Sexually Abused:      Review of Systems: General: negative for chills, fever, night sweats or weight changes.  Cardiovascular: negative for chest pain, dyspnea on exertion, edema, orthopnea, palpitations, paroxysmal nocturnal dyspnea or shortness of breath Dermatological: negative for rash Respiratory: negative for cough or wheezing Urologic: negative for hematuria Abdominal: negative for nausea, vomiting, diarrhea, bright red blood per rectum, melena, or hematemesis Neurologic: negative for visual changes, syncope, or dizziness All other systems reviewed and are otherwise negative except as noted above.    Blood pressure (!) 120/54, pulse 60, height 5\' 10"  (1.778 m), weight 237 lb (107.5 kg), SpO2 96 %.  General appearance: alert and no distress Neck: no adenopathy, no carotid bruit, no JVD, supple, symmetrical, trachea midline and thyroid not enlarged, symmetric, no tenderness/mass/nodules Lungs: clear to auscultation bilaterally Heart:  regular rate and rhythm, S1, S2 normal, no murmur, click, rub or gallop Extremities: extremities normal, atraumatic, no cyanosis or edema Pulses: 2+ and symmetric Skin: Skin color, texture, turgor normal. No rashes or lesions Neurologic: Alert and oriented X 3, normal strength and tone. Normal symmetric reflexes. Normal coordination and gait  EKG sinus rhythm at 60 with left anterior fascicular block.  I personally reviewed this EKG.  ASSESSMENT AND PLAN:   Peripheral arterial disease [facility]) Mr. Shatz was referred to me by Dr. Marlou Porch for PAD.  He does have a history of CAD and multiple cardiac risk factors.  He complains of some heaviness in his legs when he stands up but really denies symptoms of claudication.  He had lower extremity  arterial Doppler studies performed in our office 07/04/2019 that showed his right ABI was noncompressible on the left is 1.12.  He had no obstructive disease on the right and did have some tibial disease on the left.  I do not think these are contributory to any of his symptoms.  No further work-up is necessary at this time.      Lorretta Harp MD FACP,FACC,FAHA, [facility] 07/12/2019 2:09 PM

## 2019-07-12 NOTE — Assessment & Plan Note (Signed)
Mr. Berendt was referred to me by Dr. Marlou Porch for PAD.  He does have a history of CAD and multiple cardiac risk factors.  He complains of some heaviness in his legs when he stands up but really denies symptoms of claudication.  He had lower extremity arterial Doppler studies performed in our office 07/04/2019 that showed his right ABI was noncompressible on the left is 1.12.  He had no obstructive disease on the right and did have some tibial disease on the left.  I do not think these are contributory to any of his symptoms.  No further work-up is necessary at this time.

## 2019-07-31 DIAGNOSIS — K219 Gastro-esophageal reflux disease without esophagitis: Secondary | ICD-10-CM | POA: Diagnosis not present

## 2019-07-31 DIAGNOSIS — N183 Chronic kidney disease, stage 3 unspecified: Secondary | ICD-10-CM | POA: Diagnosis not present

## 2019-07-31 DIAGNOSIS — E11319 Type 2 diabetes mellitus with unspecified diabetic retinopathy without macular edema: Secondary | ICD-10-CM | POA: Diagnosis not present

## 2019-07-31 DIAGNOSIS — E1142 Type 2 diabetes mellitus with diabetic polyneuropathy: Secondary | ICD-10-CM | POA: Diagnosis not present

## 2019-07-31 DIAGNOSIS — E7800 Pure hypercholesterolemia, unspecified: Secondary | ICD-10-CM | POA: Diagnosis not present

## 2019-07-31 DIAGNOSIS — I2510 Atherosclerotic heart disease of native coronary artery without angina pectoris: Secondary | ICD-10-CM | POA: Diagnosis not present

## 2019-07-31 DIAGNOSIS — I10 Essential (primary) hypertension: Secondary | ICD-10-CM | POA: Diagnosis not present

## 2019-07-31 DIAGNOSIS — E1165 Type 2 diabetes mellitus with hyperglycemia: Secondary | ICD-10-CM | POA: Diagnosis not present

## 2019-07-31 DIAGNOSIS — N1831 Chronic kidney disease, stage 3a: Secondary | ICD-10-CM | POA: Diagnosis not present

## 2019-07-31 DIAGNOSIS — E039 Hypothyroidism, unspecified: Secondary | ICD-10-CM | POA: Diagnosis not present

## 2019-08-07 ENCOUNTER — Encounter (INDEPENDENT_AMBULATORY_CARE_PROVIDER_SITE_OTHER): Payer: Medicare HMO | Admitting: Ophthalmology

## 2019-08-07 ENCOUNTER — Other Ambulatory Visit: Payer: Self-pay

## 2019-08-07 DIAGNOSIS — H35033 Hypertensive retinopathy, bilateral: Secondary | ICD-10-CM

## 2019-08-07 DIAGNOSIS — H43813 Vitreous degeneration, bilateral: Secondary | ICD-10-CM

## 2019-08-07 DIAGNOSIS — I10 Essential (primary) hypertension: Secondary | ICD-10-CM

## 2019-08-07 DIAGNOSIS — E113512 Type 2 diabetes mellitus with proliferative diabetic retinopathy with macular edema, left eye: Secondary | ICD-10-CM

## 2019-08-07 DIAGNOSIS — E11311 Type 2 diabetes mellitus with unspecified diabetic retinopathy with macular edema: Secondary | ICD-10-CM

## 2019-08-07 DIAGNOSIS — E113311 Type 2 diabetes mellitus with moderate nonproliferative diabetic retinopathy with macular edema, right eye: Secondary | ICD-10-CM | POA: Diagnosis not present

## 2019-08-26 DIAGNOSIS — M7989 Other specified soft tissue disorders: Secondary | ICD-10-CM | POA: Diagnosis not present

## 2019-08-26 DIAGNOSIS — R05 Cough: Secondary | ICD-10-CM | POA: Diagnosis not present

## 2019-08-27 DIAGNOSIS — R05 Cough: Secondary | ICD-10-CM | POA: Diagnosis not present

## 2019-08-28 ENCOUNTER — Other Ambulatory Visit: Payer: Self-pay

## 2019-08-28 ENCOUNTER — Other Ambulatory Visit: Payer: Self-pay | Admitting: Physician Assistant

## 2019-08-28 ENCOUNTER — Ambulatory Visit
Admission: RE | Admit: 2019-08-28 | Discharge: 2019-08-28 | Disposition: A | Discharge status: Home / Self Care | Payer: Medicare HMO | Source: Ambulatory Visit | Attending: Physician Assistant | Admitting: Physician Assistant

## 2019-08-28 DIAGNOSIS — R05 Cough: Secondary | ICD-10-CM | POA: Diagnosis not present

## 2019-08-28 DIAGNOSIS — R059 Cough, unspecified: Secondary | ICD-10-CM

## 2019-08-28 DIAGNOSIS — R600 Localized edema: Secondary | ICD-10-CM | POA: Diagnosis not present

## 2019-08-28 DIAGNOSIS — Z794 Long term (current) use of insulin: Secondary | ICD-10-CM | POA: Diagnosis not present

## 2019-08-28 DIAGNOSIS — M7989 Other specified soft tissue disorders: Secondary | ICD-10-CM | POA: Diagnosis not present

## 2019-08-28 DIAGNOSIS — H6123 Impacted cerumen, bilateral: Secondary | ICD-10-CM | POA: Diagnosis not present

## 2019-08-28 DIAGNOSIS — Z79899 Other long term (current) drug therapy: Secondary | ICD-10-CM | POA: Diagnosis not present

## 2019-08-28 DIAGNOSIS — D649 Anemia, unspecified: Secondary | ICD-10-CM | POA: Diagnosis not present

## 2019-08-28 IMAGING — CR DG CHEST 2V
2 series · 2 of 2 positions shown · non-contrast
Comparison: None.

CLINICAL DATA: Cough.

EXAM:
CHEST - 2 VIEW

[w chest pa]
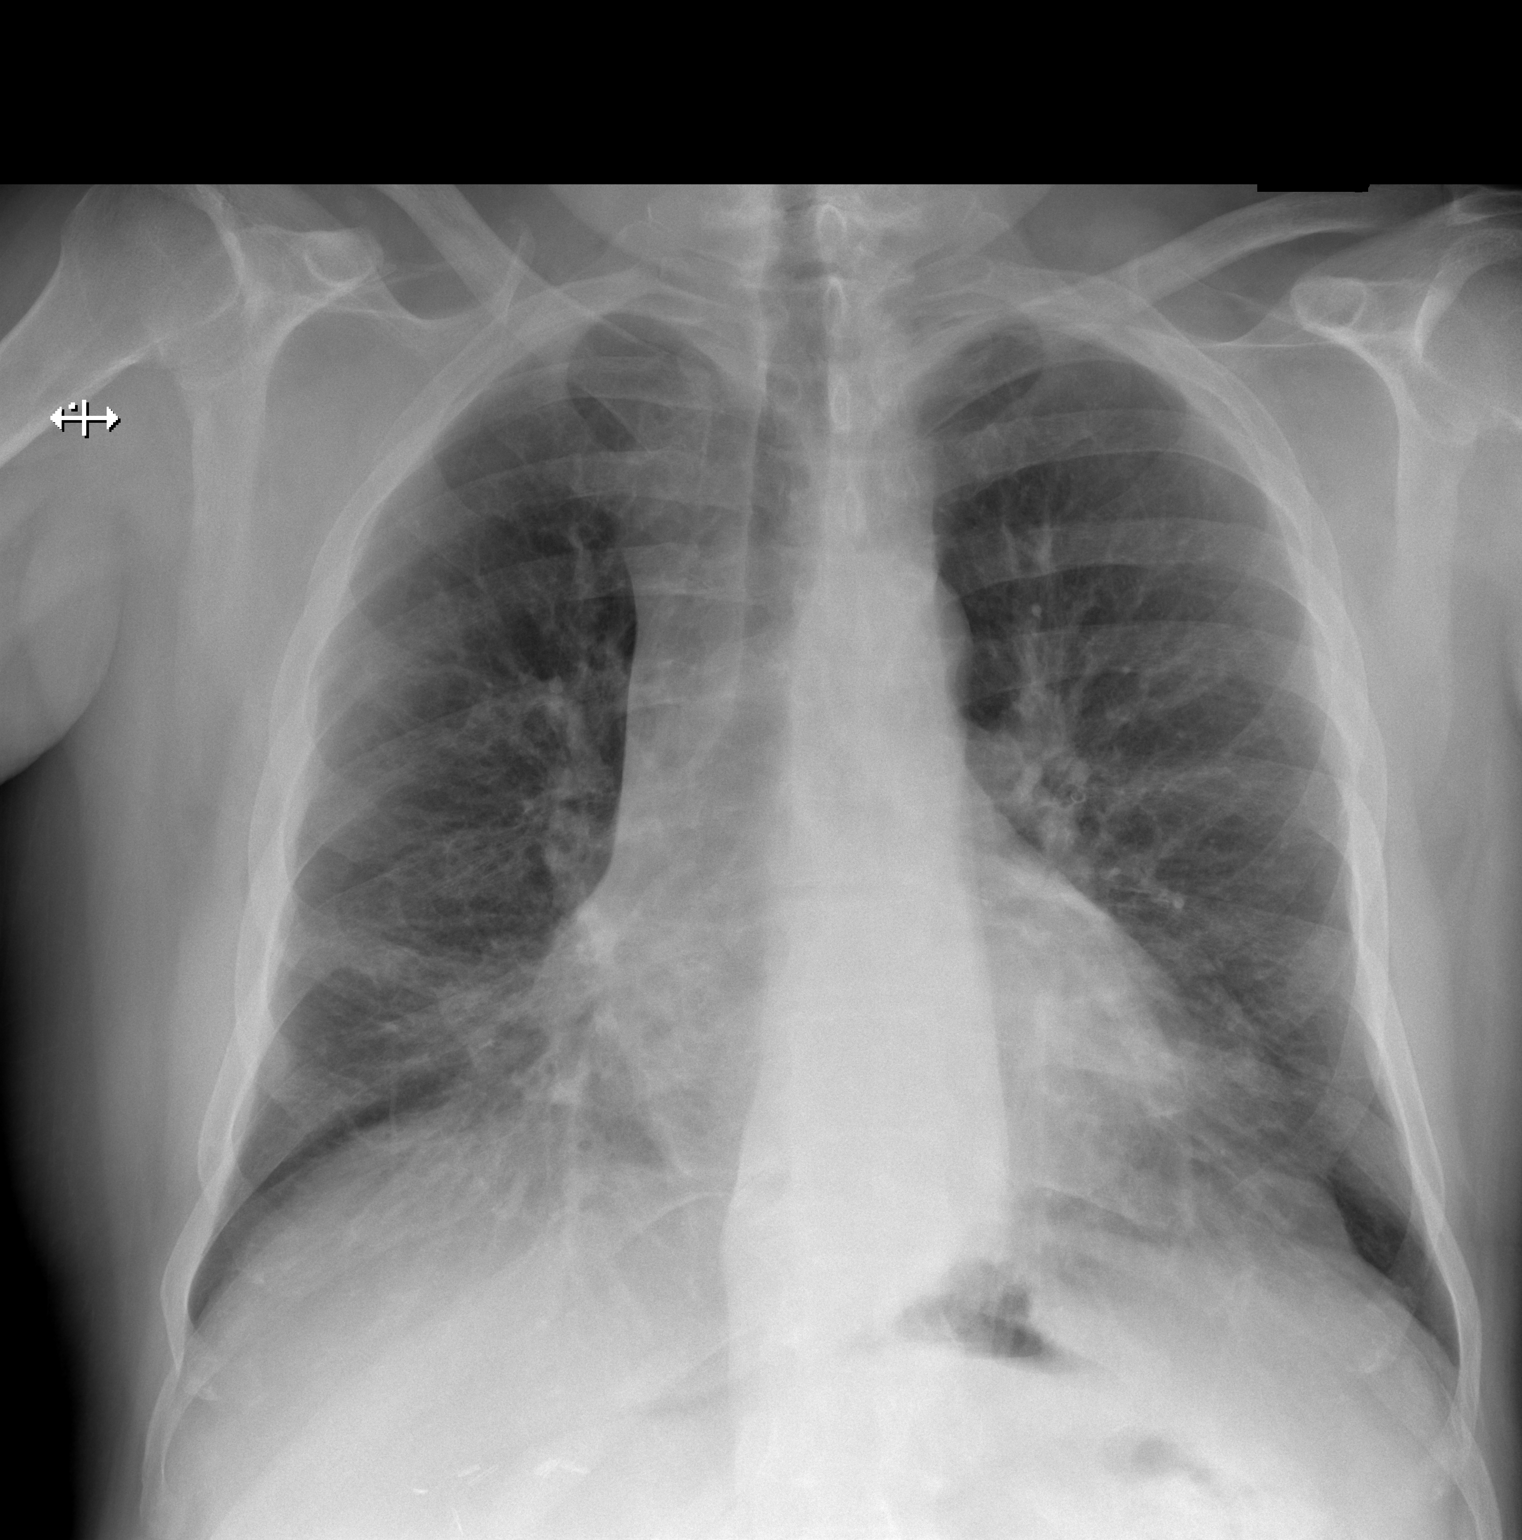

[w chest lat]
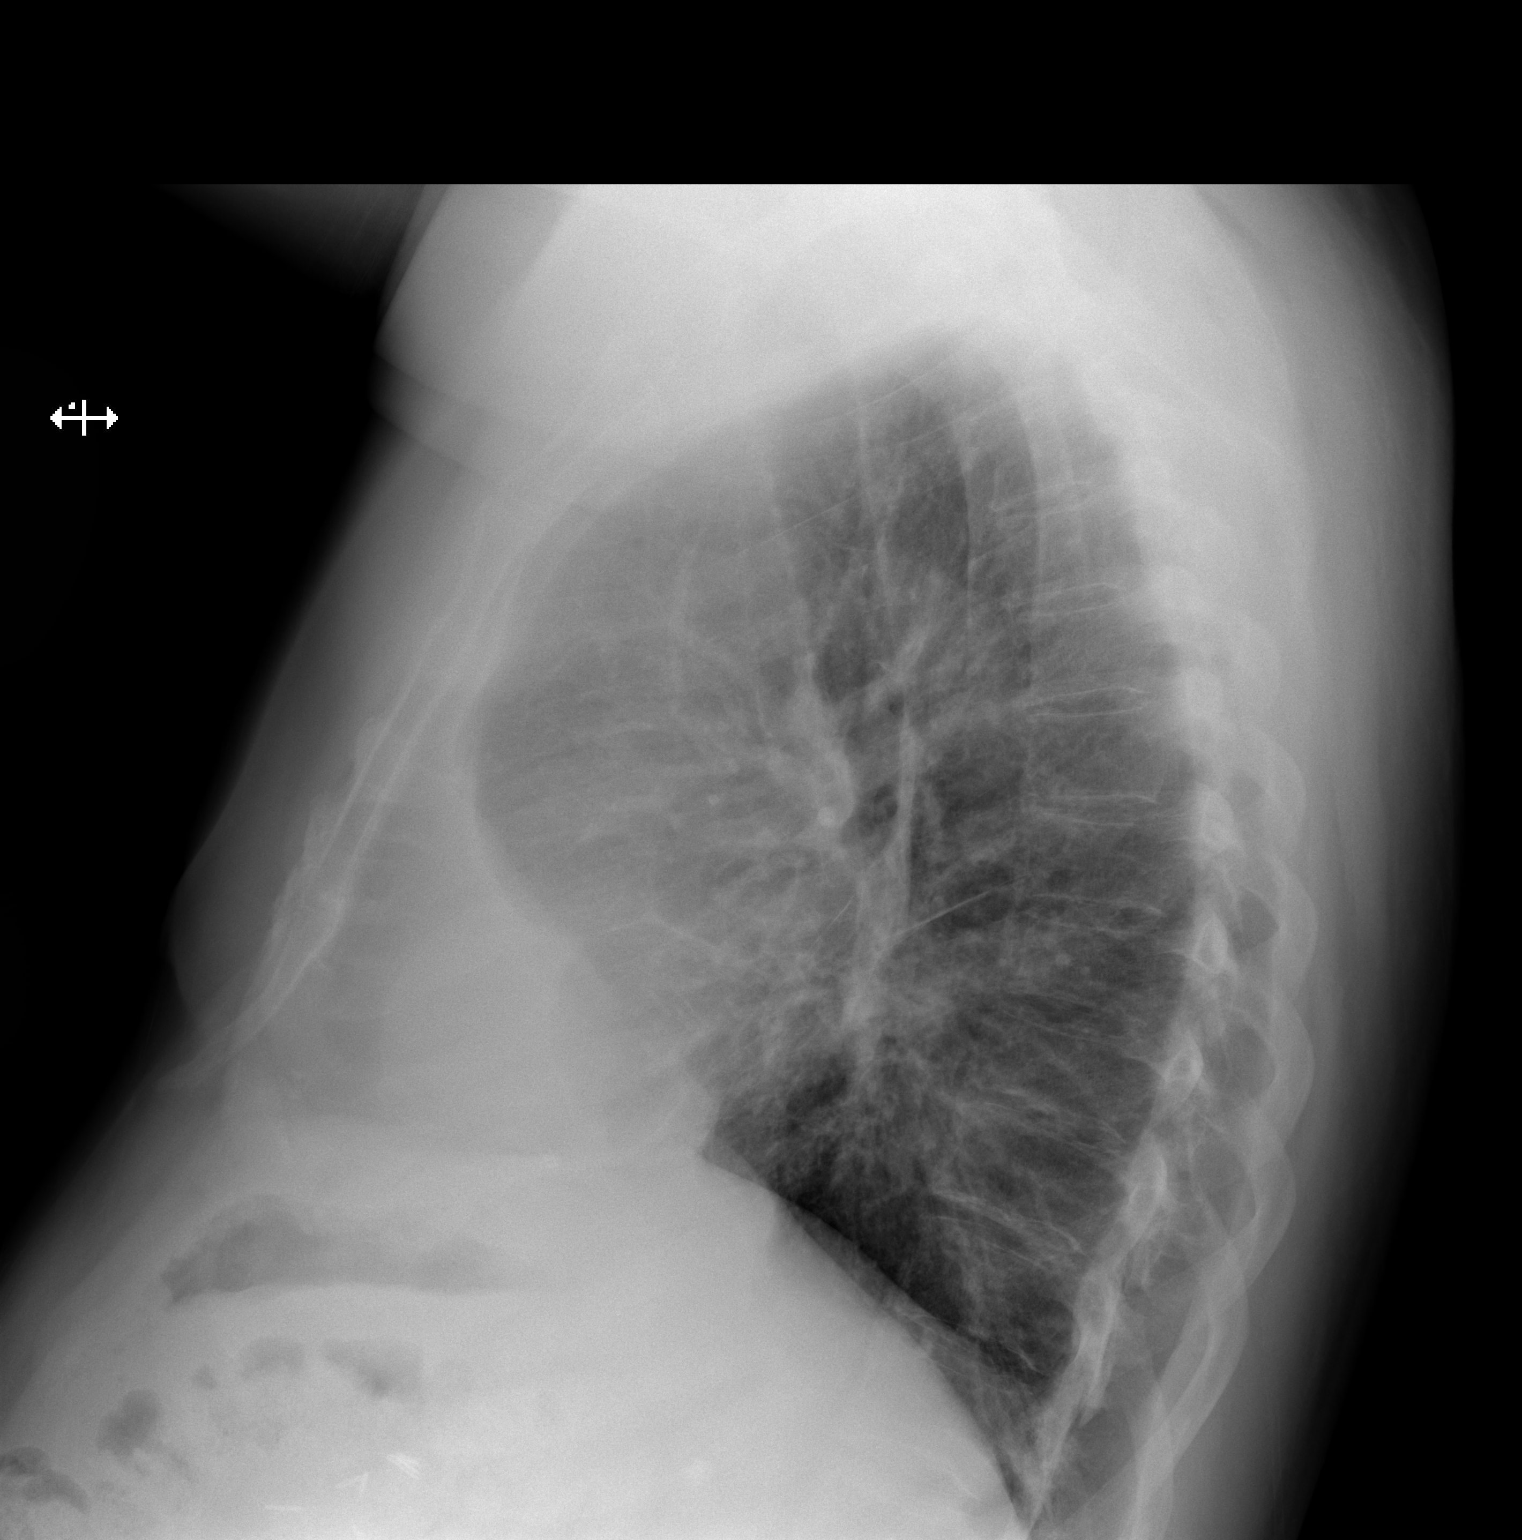

[2 of 2 positions shown; findings below may reference images not displayed]

FINDINGS: The heart size and mediastinal contours are within normal limits. No
acute airspace opacity. Unchanged bibasilar scarring. The visualized
skeletal structures are unremarkable.
IMPRESSION: No acute abnormality of the lungs. Unchanged bibasilar scarring.

## 2019-09-05 DIAGNOSIS — S92909A Unspecified fracture of unspecified foot, initial encounter for closed fracture: Secondary | ICD-10-CM

## 2019-09-05 HISTORY — DX: Unspecified fracture of unspecified foot, initial encounter for closed fracture: S92.909A

## 2019-09-11 ENCOUNTER — Other Ambulatory Visit: Payer: Self-pay

## 2019-09-11 ENCOUNTER — Encounter (INDEPENDENT_AMBULATORY_CARE_PROVIDER_SITE_OTHER): Payer: Medicare HMO | Admitting: Ophthalmology

## 2019-09-11 DIAGNOSIS — E11311 Type 2 diabetes mellitus with unspecified diabetic retinopathy with macular edema: Secondary | ICD-10-CM | POA: Diagnosis not present

## 2019-09-11 DIAGNOSIS — I10 Essential (primary) hypertension: Secondary | ICD-10-CM | POA: Diagnosis not present

## 2019-09-11 DIAGNOSIS — H35033 Hypertensive retinopathy, bilateral: Secondary | ICD-10-CM

## 2019-09-11 DIAGNOSIS — E113512 Type 2 diabetes mellitus with proliferative diabetic retinopathy with macular edema, left eye: Secondary | ICD-10-CM | POA: Diagnosis not present

## 2019-09-11 DIAGNOSIS — E113391 Type 2 diabetes mellitus with moderate nonproliferative diabetic retinopathy without macular edema, right eye: Secondary | ICD-10-CM

## 2019-09-11 DIAGNOSIS — H43813 Vitreous degeneration, bilateral: Secondary | ICD-10-CM | POA: Diagnosis not present

## 2019-09-12 ENCOUNTER — Ambulatory Visit (INDEPENDENT_AMBULATORY_CARE_PROVIDER_SITE_OTHER): Payer: Medicare HMO | Admitting: Otolaryngology

## 2019-09-12 ENCOUNTER — Encounter (INDEPENDENT_AMBULATORY_CARE_PROVIDER_SITE_OTHER): Payer: Self-pay | Admitting: Otolaryngology

## 2019-09-12 VITALS — Temp 97.5°F

## 2019-09-12 DIAGNOSIS — H6123 Impacted cerumen, bilateral: Secondary | ICD-10-CM

## 2019-09-12 NOTE — Progress Notes (Signed)
HPI: [name] is a 74 y.o. male who presents for evaluation of wax buildup in his ears.  He wears bilateral hearing aids and is looking to have a new hearing test and possibly get new hearing aids.  He was noted to have a of wax in both ear canals that were partially obstructing the TMs.  Past Medical History:  Diagnosis Date  . Adrenal insufficiency ([place])   . Allergy   . Anemia   . Arthritis    feet   . CKD (chronic kidney disease), stage III   . Coronary artery disease    has stents  . Coronary atherosclerosis of native coronary artery    Proximal LAD, posterior lateral stent widely patent-10/12/11  . Diabetes mellitus    insulin and pills  . Hearing loss    wears hearing aids  . Heart attack ([place]) 2010  . History of blood transfusion 06/30/2016   [facility] - 2 units transfused  . Hypertension   . OSA (obstructive sleep apnea)    uses VPAC sleep study 2 years done through [place]. Dr. Marlou Porch arranged study  . Pneumonia    3-4 years ago  . Sarcoid   . Sleep apnea    uses CPAP nightly  . Thyroid disease   . Type 2 diabetes mellitus ([place])    Past Surgical History:  Procedure Laterality Date  . APPENDECTOMY    . CATARACT EXTRACTION  2011   bilat  . CHOLECYSTECTOMY  01/25/2011   Procedure: LAPAROSCOPIC CHOLECYSTECTOMY WITH INTRAOPERATIVE CHOLANGIOGRAM;  Surgeon: Judieth Keens, DO;  Location: [facility];  Service: General;  Laterality: N/A;  . COLONOSCOPY     several  . CORONARY ANGIOPLASTY     most recent 11/2009  . CORONARY STENT INTERVENTION N/A 08/07/2018   Procedure: CORONARY STENT INTERVENTION;  Surgeon: Jettie Booze, MD;  Location: [place] [facility];  Service: Cardiovascular;  Laterality: N/A;  . CORONARY STENT PLACEMENT  2009   in LAD and side branch PTCA  . INTERCOSTAL NERVE BLOCK  2011, 06/2016   x2. lumbar spine  . LEFT HEART CATHETERIZATION WITH CORONARY ANGIOGRAM Bilateral 10/12/2011   Procedure: LEFT HEART CATHETERIZATION WITH CORONARY ANGIOGRAM;   Surgeon: Candee Furbish, MD;  Location: [facility] C[facility] LAB;  Service: Cardiovascular;  Laterality: Bilateral;  . LEFT HEART CATHETERIZATION WITH CORONARY ANGIOGRAM N/A 04/01/2014   Procedure: LEFT HEART CATHETERIZATION WITH CORONARY ANGIOGRAM;  Surgeon: Candee Furbish, MD;  Location: [facility] C[facility] LAB;  Service: Cardiovascular;  Laterality: N/A;  . RIGHT/LEFT HEART CATH AND CORONARY ANGIOGRAPHY N/A 08/07/2018   Procedure: RIGHT/LEFT HEART CATH AND CORONARY ANGIOGRAPHY;  Surgeon: Jettie Booze, MD;  Location: [place] [facility];  Service: Cardiovascular;  Laterality: N/A;   Social History   Socioeconomic History  . Marital status: Married    Spouse name: Not on file  . Number of children: 3  . Years of education: Not on file  . Highest education level: Not on file  Occupational History  . 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 Smoking status: Never Smoker  . Smokeless tobacco: Never Used  Vaping Use  . Vaping Use: Never used  Substance and Sexual Activity  . Alcohol use: No  . Drug use: No  . Sexual activity: Not on file  Other Topics Concern  . Not on file  Social History Narrative  . Not on file   Social Determinants  of Health   Financial Resource Strain:   . Difficulty of Paying Living Expenses:   Food Insecurity:   . Worried About Charity fundraiser in the Last Year:   . Arboriculturist in the Last Year:   Transportation Needs:   . Film/video editor (Medical):   Marland Kitchen Lack of Transportation (Non-Medical):   Physical Activity:   . Days of Exercise per Week:   . Minutes of Exercise per Session:   Stress:   . Feeling of Stress :   Social Connections:   . Frequency of Communication with Friends and Family:   . Frequency of Social Gatherings with Friends and Family:   . Attends Religious Services:   . Active Member of Clubs or Organizations:   . Attends Archivist Meetings:   Marland Kitchen Marital  Status:    Family History  Problem Relation Age of Onset  . Kidney disease Mother   . Diabetes Mother   . Kidney cancer Mother   . Heart attack Father   . Asthma Sister   . Anesthesia problems Sister        "Kidney's did not wake up"  . Colon cancer Neg Hx   . Colon polyps Neg Hx   . Rectal cancer Neg Hx   . Stomach cancer Neg Hx    Allergies  Allergen Reactions  . Hydrocortisone Nausea Only  . Metolazone     Drained, no energy  . Statins Other (See Comments)    Pt says they make him "mean"   Prior to Admission medications   Medication Sig Start Date End Date Taking? Authorizing Provider  acetaminophen (TYLENOL) 500 MG tablet Take 500 mg by mouth daily as needed for moderate pain or headache.    Yes [provider]  albuterol (PROVENTIL) (2.5 MG/3ML) 0.083% nebulizer solution Take 3 mLs (2.5 mg total) by nebulization every 6 (six) hours as needed for wheezing or shortness of breath. Dx Code D86.0 07/24/14  Yes Elsie Stain, MD  Albuterol Sulfate (PROAIR RESPICLICK) 604 (90 BASE) MCG/ACT AEPB Inhale 2 puffs into the lungs every 6 (six) hours as needed. 07/24/14  Yes Elsie Stain, MD  allopurinol (ZYLOPRIM) 100 MG tablet TAKE 1 TABLET BY MOUTH TWICE A DAY 01/22/19  Yes Dondra Prader R, NP  aspirin EC 81 MG tablet Take 81 mg by mouth every evening.    Yes [provider]  Cholecalciferol (VITAMIN D3) 3000 UNITS TABS Take 3,000 Units by mouth daily.   Yes [provider]  clopidogrel (PLAVIX) 75 MG tablet Take 1 tablet (75 mg total) by mouth daily. 04/17/14  Yes Jerline Pain, MD  Coenzyme Q10 (COQ-10) 400 MG CAPS Take 400 mg by mouth every evening.   Yes [provider]  Colchicine 0.6 MG CAPS Take 1 tablet by mouth 2 (two) times daily as needed. 10/12/18  Yes Hilts, Legrand Como, MD  doxycycline (VIBRA-TABS) 100 MG tablet Take 1 tablet (100 mg total) by mouth 2 (two) times daily. 02/04/19  Yes Persons, Bevely Palmer, [place]  DROPLET INSULIN SYRINGE 31G X  5/16" 1 ML MISC  08/14/18  Yes [provider]  fish oil-omega-3 fatty acids 1000 MG capsule Take 1 g by mouth daily.    Yes [provider]  furosemide (LASIX) 80 MG tablet Take 1 tablet (80 mg total) by mouth daily. 10/07/18  Yes Hongalgi, Lenis Dickinson, MD  glipiZIDE (GLUCOTROL) 10 MG tablet Take 10 mg by mouth every evening.  Yes [provider]  insulin aspart protamine- aspart (NOVOLOG MIX 70/30) (70-30) 100 UNIT/ML injection Inject 0.3 mLs (30 Units total) into the skin 2 (two) times daily with a meal. 10/07/18  Yes Hongalgi, Lenis Dickinson, MD  levothyroxine (SYNTHROID, LEVOTHROID) 50 MCG tablet Take 50 mcg by mouth daily.  06/21/16  Yes [provider]  montelukast (SINGULAIR) 10 MG tablet Take 10 mg by mouth daily as needed (allergies).  07/18/18  Yes [provider]  Multiple Vitamin (MULTIVITAMIN WITH MINERALS) TABS tablet Take 1 tablet by mouth daily. 10/08/18  Yes Hongalgi, Lenis Dickinson, MD  pantoprazole (PROTONIX) 40 MG tablet Take 40 mg by mouth daily. 03/21/18  Yes [provider]  predniSONE (DELTASONE) 10 MG tablet Take 1 tablet (10 mg total) by mouth daily with breakfast. T 05/15/15  Yes Parrett, Tammy S, NP  rosuvastatin (CRESTOR) 10 MG tablet TAKE 1 TABLET AT BEDTIME 07/10/19  Yes Skains, Thana Farr, MD  sodium chloride (OCEAN) 0.65 % SOLN nasal spray Place 1 spray into both nostrils as needed for congestion.   Yes [provider]  Vitamin D, Ergocalciferol, (DRISDOL) 50000 UNITS CAPS capsule Take 50,000 Units by mouth every Sunday.  12/21/13  Yes [provider]     Positive ROS: Otherwise negative  All other systems have been reviewed and were otherwise negative with the exception of those mentioned in the HPI and as above.  Physical Exam: Constitutional: Alert, well-appearing, no acute distress Ears: External ears without lesions or tenderness. Ear canals were cleaned with hydroperoxide suction and forceps.  Of note he had a little  bit of bleeding on the right side deep within the ear canal that was controlled with silver nitrate slight pressure and hydroperoxide irrigation.  TMs were clear bilaterally.. Nasal: External nose without lesions. Clear nasal passages Oral: Oropharynx clear. Neck: No palpable adenopathy or masses Respiratory: Breathing comfortably  Skin: No facial/neck lesions or rash noted.  Cerumen impaction removal  Date/Time: 09/12/2019 6:01 PM Performed by: Rozetta Nunnery, MD Authorized by: Rozetta Nunnery, MD   Consent:    Consent obtained:  Verbal   Consent given by:  Patient   Risks discussed:  Pain and bleeding Procedure details:    Location:  L ear and R ear   Procedure type: curette, suction and forceps   Post-procedure details:    Inspection:  TM intact and canal normal   Hearing quality:  Improved   Patient tolerance of procedure:  Tolerated well, no immediate complications Comments:     TMs are clear bilaterally.  He had slight bleeding from the right ear canal that was controlled with silver nitrate hydroperoxide and suction.    Assessment: Cerumen buildup bilaterally Underlying SNHL and wears hearing aids  Plan: Ear canals were cleaned in the office.  He will follow-up as needed.  Radene Journey, MD

## 2019-09-19 ENCOUNTER — Ambulatory Visit (INDEPENDENT_AMBULATORY_CARE_PROVIDER_SITE_OTHER): Payer: Medicare HMO | Admitting: Otolaryngology

## 2019-09-21 DIAGNOSIS — S93492A Sprain of other ligament of left ankle, initial encounter: Secondary | ICD-10-CM | POA: Diagnosis not present

## 2019-09-24 ENCOUNTER — Ambulatory Visit: Payer: Medicare HMO | Admitting: Orthopedic Surgery

## 2019-09-24 ENCOUNTER — Ambulatory Visit (INDEPENDENT_AMBULATORY_CARE_PROVIDER_SITE_OTHER): Payer: Medicare HMO

## 2019-09-24 ENCOUNTER — Encounter: Payer: Self-pay | Admitting: Orthopedic Surgery

## 2019-09-24 ENCOUNTER — Other Ambulatory Visit: Payer: Self-pay

## 2019-09-24 VITALS — Ht 70.0 in | Wt 237.0 lb

## 2019-09-24 DIAGNOSIS — M14672 Charcot's joint, left ankle and foot: Secondary | ICD-10-CM

## 2019-09-24 DIAGNOSIS — M79672 Pain in left foot: Secondary | ICD-10-CM

## 2019-09-24 NOTE — Progress Notes (Signed)
Office Visit Note   Patient: [name]           Date of Birth: [birth year]           MRN: [id number] Visit Date: 09/24/2019              Requested by: Shirline Frees, MD [address],  [place] [id number] PCP: Shirline Frees, MD  Chief Complaint  Patient presents with  . Left Foot - Pain, Follow-up      HPI: Patient is a 74 year old gentleman with multiple medical problems including diabetes peripheral vascular disease who states he stepped in a hole recently and has had swelling and deformity to his left foot since that time.  He was seen at an urgent care was placed in a fracture boot and is seen today for initial evaluation.  Patient has significant peripheral vascular disease he states he was told he had blocked arteries in the left leg he states he is status post multiple stent placements for his heart and the initial stent placement caused a cardiac arrest.  Patient states that 1 of these stents in his heart is only open 60%.  Patient states he recently has had kidney failure secondary to his diuretics.  Assessment & Plan: Visit Diagnoses:  1. Left foot pain   2. Charcot's joint, left ankle and foot     Plan: Discussed with patient with his peripheral vascular disease and a stable Charcot collapse of the left foot we will follow this conservatively.  Discussed that if there is further progression of the collapse and patient is at risk of the bone ulcerating the skin that we could consider surgical intervention however with the patient's calcified peripheral vascular disease he has an increased risk of the wound not healing and surgical complications.  Patient will continue with his fracture boot and minimize weightbearing.  Follow-Up Instructions: No follow-ups on file.   Ortho Exam  Patient is alert, oriented, no adenopathy, well-dressed, normal affect, normal respiratory effort. Examination patient does not have palpable pulses there is massive  swelling in the left foot there is no redness no cellulitis no open ulcers there is no rocker-bottom deformity his foot is flat there is no swelling in the right foot.  Radiographs show the Charcot collapse across the Lisfranc complex without rocker-bottom deformity.  Patient's last hemoglobin A1c 1 year ago was 10.2.  Imaging: XR Foot Complete Left  Result Date: 09/24/2019 Three-view radiographs of the left foot shows previous fusion of the MTP joint of the left great to with significant peripheral vascular disease with calcification of the arteries through the midfoot.  There is no rocker-bottom deformity.  E which is stable patient has a Charcot collapse through the Lisfranc joint of the left foot  No images are attached to the encounter.  Labs: Lab Results  Component Value Date   HGBA1C 10.2 (H) 10/04/2018   HGBA1C (H) 08/15/2008    12.2 (NOTE) The ADA recommends the following therapeutic goal for glycemic control related to Hgb A1c measurement: Goal of therapy: <6.5 Hgb A1c  Reference: American Diabetes Association: Clinical Practice Recommendations 2010, Diabetes Care, 2010, 33: (Suppl  1).   HGBA1C (H) 11/27/2007    9.5 (NOTE)   The ADA recommends the following therapeutic goal for glycemic   control related to Hgb A1C measurement:   Goal of Therapy:   < 7.0% Hgb A1C   Reference: American Diabetes Association: Clinical Practice   Recommendations 2008, Diabetes Care,  2008, 31:(Suppl 1).   LABURIC 7.2 10/30/2018   REPTSTATUS 11/10/2010 FINAL 11/09/2010   CULT NO GROWTH 11/09/2010     Lab Results  Component Value Date   ALBUMIN 2.9 (L) 10/07/2018   ALBUMIN 2.9 (L) 10/04/2018   ALBUMIN 3.2 (L) 10/03/2018   PREALBUMIN 21.9 10/04/2018   LABURIC 7.2 10/30/2018    Lab Results  Component Value Date   MG 1.7 10/06/2018   MG 1.6 (L) 10/04/2018   MG 1.8 10/03/2018   No results found for: VD25OH  Lab Results  Component Value Date   PREALBUMIN 21.9 10/04/2018   CBC  EXTENDED Latest Ref Rng & Units 10/07/2018 10/06/2018 10/05/2018  WBC 4.0 - 10.5 K/uL 10.9(H) 11.3(H) 11.4(H)  RBC 4.22 - 5.81 MIL/uL 3.28(L) 3.10(L) 3.06(L)  HGB 13.0 - 17.0 g/dL 10.2(L) 9.6(L) 9.5(L)  HCT 39 - 52 % 31.5(L) 30.6(L) 29.5(L)  PLT 150 - 400 K/uL 244 237 240  NEUTROABS 1.7 - 7.7 K/uL - - -  LYMPHSABS 0.7 - 4.0 K/uL - - -     Body mass index is 34.01 kg/m.  Orders:  Orders Placed This Encounter  Procedures  . XR Foot Complete Left   No orders of the defined types were placed in this encounter.    Procedures: No procedures performed  Clinical Data: No additional findings.  ROS:  All other systems negative, except as noted in the HPI. Review of Systems  Objective: Vital Signs: Ht 5\' 10"  (1.778 m)   Wt 237 lb (107.5 kg)   BMI 34.01 kg/m   Specialty Comments:  No specialty comments available.  PMFS History: Patient Active Problem List   Diagnosis Date Noted  . Peripheral arterial disease ([place]) 07/12/2019  . Pain in right elbow 10/30/2018  . Acute kidney injury superimposed on chronic kidney disease ([place]) 10/04/2018  . AKI (acute kidney injury) ([place]) 10/03/2018  . Hypoalbuminemia 10/03/2018  . Hypokalemia 10/03/2018  . Hypothyroidism 10/03/2018  . CKD (chronic kidney disease), stage III ([place])   . Iron deficiency anemia   . Symptomatic anemia 06/29/2016  . Chronic diastolic heart failure ([place]) 01/14/2014  . Stable angina ([place]) 01/14/2014  . Obesity 01/14/2014  . Pulmonary sarcoidosis ([place]) 01/14/2014  . Uncontrolled diabetes mellitus ([place]) 01/14/2014  . Hyperlipidemia 01/14/2014  . Coronary atherosclerosis of native coronary artery   . Sinusitis, chronic 05/30/2011  . Sleep apnea 10/09/2008  . ADRENAL INSUFFICIENCY, HX OF 02/21/2008  . Coronary artery disease 11/20/2007  . Sarcoidosis 02/20/2007  . Diabetes mellitus without complication ([place]) 53/66/4403  . GERD 12/20/2006   Past Medical History:  Diagnosis Date  . Adrenal insufficiency ([place])     . Allergy   . Anemia   . Arthritis    feet   . CKD (chronic kidney disease), stage III   . Coronary artery disease    has stents  . Coronary atherosclerosis of native coronary artery    Proximal LAD, posterior lateral stent widely patent-10/12/11  . Diabetes mellitus    insulin and pills  . Hearing loss    wears hearing aids  . Heart attack ([place]) 2010  . History of blood transfusion 06/30/2016   [facility] - 2 units transfused  . Hypertension   . OSA (obstructive sleep apnea)    uses VPAC sleep study 2 years done through [place]. Dr. Marlou Porch arranged study  . Pneumonia    3-4 years ago  . Sarcoid   . Sleep apnea    uses CPAP nightly  . Thyroid disease   .  Type 2 diabetes mellitus ([facility])     Family History  Problem Relation Age of Onset  . Kidney disease Mother   . Diabetes Mother   . Kidney cancer Mother   . Heart attack Father   . Asthma Sister   . Anesthesia problems Sister        "Kidney's did not wake up"  . Colon cancer Neg Hx   . Colon polyps Neg Hx   . Rectal cancer Neg Hx   . Stomach cancer Neg Hx     Past Surgical History:  Procedure Laterality Date  . APPENDECTOMY    . CATARACT EXTRACTION  2011   bilat  . CHOLECYSTECTOMY  01/25/2011   Procedure: LAPAROSCOPIC CHOLECYSTECTOMY WITH INTRAOPERATIVE CHOLANGIOGRAM;  Surgeon: Judieth Keens, DO;  Location: [facility];  Service: General;  Laterality: N/A;  . COLONOSCOPY     several  . CORONARY ANGIOPLASTY     most recent 11/2009  . CORONARY STENT INTERVENTION N/A 08/07/2018   Procedure: CORONARY STENT INTERVENTION;  Surgeon: Jettie Booze, MD;  Location: [place] [facility] LAB;  Service: Cardiovascular;  Laterality: N/A;  . CORONARY STENT PLACEMENT  2009   in LAD and side branch PTCA  . INTERCOSTAL NERVE BLOCK  2011, 06/2016   x2. lumbar spine  . LEFT HEART CATHETERIZATION WITH CORONARY ANGIOGRAM Bilateral 10/12/2011   Procedure: LEFT HEART CATHETERIZATION WITH CORONARY ANGIOGRAM;  Surgeon: Candee Furbish, MD;   Location[facility] CATH LAB;  Service: Cardiovascular;  Laterality: Bilateral;  . LEFT HEART CATHETERIZATION WITH CORONARY ANGIOGRAM N/A 04/01/2014   Procedure: LEFT HEART CATHETERIZATION WITH CORONARY ANGIOGRAM;  Surgeon: Candee Furbish, MD;  Location[facility] LAB;  Service: Cardiovascular;  Laterality: N/A;  . RIGHT/LEFT HEART CATH AND CORONARY ANGIOGRAPHY N/A 08/07/2018   Procedure: RIGHT/LEFT HEART CATH AND CORONARY ANGIOGRAPHY;  Surgeon: Jettie Booze, MD;  Location: [place] [facility] LAB;  Service: Cardiovascular;  Laterality: N/A;   Social History   Occupational History  . 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 Smoking status: Never Smoker  . Smokeless tobacco: Never Used  Vaping Use  . Vaping Use: Never used  Substance and Sexual Activity  . Alcohol use: No  . Drug use: No  . Sexual activity: Not on file

## 2019-09-26 ENCOUNTER — Ambulatory Visit: Payer: Medicare HMO | Admitting: Orthopedic Surgery

## 2019-10-03 DIAGNOSIS — D649 Anemia, unspecified: Secondary | ICD-10-CM | POA: Diagnosis not present

## 2019-10-07 ENCOUNTER — Telehealth: Payer: Self-pay | Admitting: Cardiology

## 2019-10-07 NOTE — Telephone Encounter (Signed)
Agree with plan Taia Bramlett, MD  

## 2019-10-07 NOTE — Telephone Encounter (Signed)
Pt states always gets SOB but worse and different over last 2 weeks. Have not occurred at rest.  On walk to mailbox and back (100 feet) Carrying groceries, one bag at a time.  SOB, nauseated, chest hurts.  All eases up after rest approx 15 min.  Swelling in left leg. Left foot is broken currently.  There are blocked arteries in this leg so wearing a boot and no surgery to repair due to this.  Only partial weight bearing.  He feels like one of his arteries could be blocked.  Had these symptoms in past when needed intervention. He has follow up w Nicki Reaper on 10/11/19. Adv him to minimize activities doing nothing that causes the symptoms.  Adv that if symptoms come on at rest or if during activity they become worse or are accompanied by other symptoms he needs to go to ER to be eval'd urgently.   He is in agreement w this plan.  Pt aware that I am forwarding to [employer], PA-C and if there are any new recommendations I will call him back. Will also send to Dr. Marlou Porch.

## 2019-10-07 NOTE — Telephone Encounter (Signed)
Pt c/o of Chest Pain: STAT if CP now or developed within 24 hours  1. Are you having CP right now? no  2. Are you experiencing any other symptoms (ex. SOB, nausea, vomiting, sweating)? SOB  3. How long have you been experiencing CP? Few weeks  4. Is your CP continuous or coming and going? comes and goes  5. Have you taken Nitroglycerin? No  Patient states for a few weeks he has had chest pain and SOB when he goes out and walks. He states he is not having symptoms now. He also states it feels the same as when he had a blocked artery. He is scheduled 10/11/2019 with [name]. Please advise.  ?

## 2019-10-07 NOTE — Telephone Encounter (Signed)
I also agree with the plan. [name], PA-C    10/07/2019 4:27 PM

## 2019-10-08 ENCOUNTER — Ambulatory Visit: Payer: Medicare HMO | Admitting: Orthopedic Surgery

## 2019-10-09 ENCOUNTER — Encounter: Payer: Self-pay | Admitting: Family

## 2019-10-09 ENCOUNTER — Ambulatory Visit (INDEPENDENT_AMBULATORY_CARE_PROVIDER_SITE_OTHER): Payer: Medicare HMO

## 2019-10-09 ENCOUNTER — Ambulatory Visit: Payer: Medicare HMO | Admitting: Family

## 2019-10-09 VITALS — Ht 70.0 in | Wt 237.0 lb

## 2019-10-09 DIAGNOSIS — M14672 Charcot's joint, left ankle and foot: Secondary | ICD-10-CM

## 2019-10-09 DIAGNOSIS — E1165 Type 2 diabetes mellitus with hyperglycemia: Secondary | ICD-10-CM

## 2019-10-10 DIAGNOSIS — R195 Other fecal abnormalities: Secondary | ICD-10-CM | POA: Diagnosis not present

## 2019-10-10 NOTE — Progress Notes (Signed)
Cardiology Office Note:    Date:  10/11/2019   ID:  [name], DOB Jun 03, 1945, MRN [id number]  PCP:  Craig Frees, MD  Cardiologist:  Craig Furbish, MD  Electrophysiologist:  None   Referring MD: Craig Frees, MD   Chief Complaint:  Chest Pain    Patient Profile:    [name] is a 74 y.o. male with:   Coronary artery disease   S/p DES to LAD and PL branch in 8/11  S/p DES x 2 to RPDA 08/2018  Echocardiogram 07/2018: EF 60-65, Gr 1 DD  Pulmonary sarcoidosis  Hx of GI bleed   Chronic kidney disease  Diastolic CHF  Adrenal insufficiency, chronic steroids    Diabetes mellitus   Hypertension   OSA  Hyperlipidemia   Hypothyroidism   Peripheral arterial disease   [place] 5/21: L tibial disease >> seen by [name] >> med Rx   Prior CV studies: R/L cardiac catheterization 08/07/2018 LAD proximal stent patent with 40 ISR LCx mid 50 RCA proximal 25; RPDA ostial 80, 70; RP AV stent patent Mean PA 18, mean PCWP 12 PCI: 2.25 x 8 mm Resolute Onyx DES and 2 x 3 mm Resolute Onyx DES to the RPDA  Echocardiogram 07/27/2018 EF 60-65, mild LVH, GR 1 DD, normal RVSF  History of Present Illness:    [name] was last seen by [name] in 06/2019.  He called in recently with exertional shortness of breath and chest pain.  He is seen for further evaluation.       He notes progressively worsening exertional chest heaviness.  His symptoms improved with rest.  He notes exertional shortness of breath.  He has not had any radiating symptoms or diaphoresis.  However, he does have nausea.  He has not had orthopnea or PND.  He has a fallen arch on his right foot and is currently wearing a boot.  He has had some swelling in his legs.  Right leg is worse than the left.  He has not had syncope but has been lightheaded at times.  He has a history of colon polyps.  His hemoglobin has been low in the last few months.  Hemoccult with primary care was recently positive.  He has referral to GI  pending.  He had a repeat CBC yesterday but does not know the results yet.  Past Medical History:  Diagnosis Date  . Adrenal insufficiency ([place])   . Allergy   . Anemia   . Arthritis    feet   . CKD (chronic kidney disease), stage III   . Coronary artery disease    has stents  . Coronary atherosclerosis of native coronary artery    Proximal LAD, posterior lateral stent widely patent-10/12/11  . Diabetes mellitus    insulin and pills  . Hearing loss    wears hearing aids  . Heart attack ([place]) 2010  . History of blood transfusion 06/30/2016   [name] - 2 units transfused  . Hypertension   . OSA (obstructive sleep apnea)    uses VPAC sleep study 2 years done through [place]. [name] arranged study  . Pneumonia    3-4 years ago  . Sarcoid   . Sleep apnea    uses CPAP nightly  . Thyroid disease   . Type 2 diabetes mellitus (HCC)     Current Medications: Current Meds  Medication Sig  . acetaminophen (TYLENOL) 500 MG tablet Take 500 mg by mouth daily as needed for  moderate pain or headache.   . albuterol (PROVENTIL) (2.5 MG/3ML) 0.083% nebulizer solution Take 3 mLs (2.5 mg total) by nebulization every 6 (six) hours as needed for wheezing or shortness of breath. Dx Code D86.0  . Albuterol Sulfate (PROAIR RESPICLICK) 161 (90 BASE) MCG/ACT AEPB Inhale 2 puffs into the lungs every 6 (six) hours as needed.  Marland Kitchen allopurinol (ZYLOPRIM) 100 MG tablet TAKE 1 TABLET BY MOUTH TWICE A DAY  . aspirin EC 81 MG tablet Take 81 mg by mouth every evening.   . Cholecalciferol (VITAMIN D3) 3000 UNITS TABS Take 3,000 Units by mouth daily.  . clopidogrel (PLAVIX) 75 MG tablet Take 1 tablet (75 mg total) by mouth daily.  . Coenzyme Q10 (COQ-10) 400 MG CAPS Take 400 mg by mouth every evening.  . Colchicine 0.6 MG CAPS Take 1 tablet by mouth 2 (two) times daily as needed.  . doxycycline (VIBRA-TABS) 100 MG tablet Take 1 tablet (100 mg total) by mouth 2 (two) times daily.  . DROPLET INSULIN SYRINGE 31G  X 5/16" 1 ML MISC   . fish oil-omega-3 fatty acids 1000 MG capsule Take 1 g by mouth daily.   . furosemide (LASIX) 80 MG tablet Take 1 tablet (80 mg total) by mouth daily.  Marland Kitchen glipiZIDE (GLUCOTROL) 10 MG tablet Take 10 mg by mouth every evening.   . insulin aspart protamine- aspart (NOVOLOG MIX 70/30) (70-30) 100 UNIT/ML injection Inject 0.3 mLs (30 Units total) into the skin 2 (two) times daily with a meal.  . levothyroxine (SYNTHROID, LEVOTHROID) 50 MCG tablet Take 50 mcg by mouth daily.   . montelukast (SINGULAIR) 10 MG tablet Take 10 mg by mouth daily as needed (allergies).   . Multiple Vitamin (MULTIVITAMIN WITH MINERALS) TABS tablet Take 1 tablet by mouth daily.  . pantoprazole (PROTONIX) 40 MG tablet Take 40 mg by mouth daily.  . predniSONE (DELTASONE) 10 MG tablet Take 1 tablet (10 mg total) by mouth daily with breakfast. T  . rosuvastatin (CRESTOR) 10 MG tablet TAKE 1 TABLET AT BEDTIME  . sodium chloride (OCEAN) 0.65 % SOLN nasal spray Place 1 spray into both nostrils as needed for congestion.  . Vitamin D, Ergocalciferol, (DRISDOL) 50000 UNITS CAPS capsule Take 50,000 Units by mouth every Sunday.      Allergies:   Hydrocortisone, Metolazone, and Statins   Social History   Tobacco Use  . Smoking status: Never Smoker  . Smokeless tobacco: Never Used  Vaping Use  . Vaping Use: Never used  Substance Use Topics  . Alcohol use: No  . Drug use: No     Family Hx: The patient's family history includes Anesthesia problems in his sister; Asthma in his sister; Diabetes in his mother; Heart attack in his father; Kidney cancer in his mother; Kidney disease in his mother. There is no history of Colon cancer, Colon polyps, Rectal cancer, or Stomach cancer.  Review of Systems  Constitutional: Positive for fever (3 weeks ago, 99 degrees, resolved).  Respiratory: Positive for cough (ACE inhib changed to ARB).   Gastrointestinal: Negative for hematochezia (but Hemoccult + at PCP office) and  melena.  Genitourinary: Negative for hematuria.     EKGs/Labs/Other Test Reviewed:    EKG:  EKG is  ordered today.  The ekg ordered today demonstrates normal sinus rhythm, heart rate 79, left axis deviation, PVC, PAC, poor R wave progression, QTC 442, no change since prior tracing  Recent Labs: No results [name] for requested labs within last 8760 hours.  Recent Lipid Panel Lab Results  Component Value Date/Time   CHOL 219 (H) 07/27/2018 12:39 PM   TRIG 269 (H) 07/27/2018 12:39 PM   HDL 32 (L) 07/27/2018 12:39 PM   CHOLHDL 6.8 (H) 07/27/2018 12:39 PM   CHOLHDL 11.4 08/15/2008 04:08 AM   LDLCALC 133 (H) 07/27/2018 12:39 PM    Physical Exam:    VS:  BP (!) 110/52   Pulse 79   Ht 5\' 10"  (1.778 m)   Wt 231 lb 3.2 oz (104.9 kg)   SpO2 97%   BMI 33.17 kg/m     Wt Readings from Last 3 Encounters:  10/11/19 231 lb 3.2 oz (104.9 kg)  10/09/19 237 lb (107.5 kg)  09/24/19 237 lb (107.5 kg)     Constitutional:      Appearance: Healthy appearance. Not in distress.  Neck:     Thyroid: No thyromegaly.     Vascular: JVD normal.  Pulmonary:     Effort: Pulmonary effort is normal.     Breath sounds: No wheezing. No rales.  Cardiovascular:     Normal rate. Regular rhythm. Normal S1. Normal S2.     Murmurs: There is no murmur.  Edema:    Pretibial: bilateral trace edema of the pretibial area. Abdominal:     Palpations: Abdomen is soft.  Musculoskeletal:     Comments: Boot on L foot Skin:    General: Skin is warm and dry.  Neurological:     General: No focal deficit present.     Mental Status: Alert and oriented to person, place and time.     Cranial Nerves: Cranial nerves are intact.      ASSESSMENT & PLAN:    1. Coronary artery disease involving native coronary artery of native heart with angina pectoris [facility]) History of DES to the LAD and posterior lateral branch in August 2011.  He last underwent PCI with drug-eluting stent x2 to the RPDA in June 2020.  Over the last  2 months, he has developed progressively worsening anginal symptoms.  These are similar to the symptoms he had last year prior to his PCI.  We discussed management including cardiac catheterization.  However, he does have chronic kidney disease which would put him at higher risk for contrast-induced nephropathy.  Also, he is being evaluated currently for Hemoccult positive anemia.  Hemoglobin in June 2021 was 9.4.  He had a repeat CBC yesterday with primary care.  Given the ongoing work-up for possible GI bleeding, it would not be advisable to proceed directly to cardiac catheterization at this time.  He will need to have his anemia evaluated first.  I reviewed this with the patient.  I also discussed his case with his cardiologist, [name].  I will reduce his dose of losartan to 50 mg daily.  He does not take PDE-5 inhibitors.  I will place him on isosorbide.  We will obtain recent labs from primary care and see him back in 2 weeks.  He knows to go to the emergency room if his symptoms should worsen.  -Decrease losartan to 50 mg daily  -Start isosorbide 30 mg daily  -Continue aspirin, clopidogrel, rosuvastatin  -Follow-up 2 weeks  -Go to ED if symptoms worsen  2. Stage 3a chronic kidney disease Recent creatinine 1.6.  3. Essential hypertension Blood pressure well controlled.  However, I am not certain he would be able to tolerate isosorbide without reducing his dose of losartan.  Losartan will be reduced to 50 mg daily  as outlined above.  4. Chronic diastolic heart failure (HCC) NYHA III.  I suspect his shortness of breath is mainly impacted by his anemia and ischemic heart disease.  Volume status appears stable.  Continue current dose of furosemide.  5. Anemia, unspecified type As noted, he has Hemoccult-positive anemia.  He is being followed by primary care and is being referred to gastroenterology.  We will obtain his CBC from yesterday from his PCP.  At his follow-up in 2 weeks, we can  determine if it is safe to proceed with cardiac catheterization based upon his GI work-up.    Dispo:  Return in about 2 weeks (around 10/25/2019) for Close Follow Up with [name], [name], PA-C or APP on [name]' team.   Medication Adjustments/Labs and Tests Ordered: Current medicines are reviewed at length with the patient today.  Concerns regarding medicines are outlined above.  Tests Ordered: Orders Placed This Encounter  Procedures  . EKG 12-Lead   Medication Changes: Meds ordered this encounter  Medications  . losartan (COZAAR) 50 MG tablet    Sig: Take 1 tablet (50 mg total) by mouth daily.    Dispense:  90 tablet    Refill:  3  . isosorbide mononitrate (IMDUR) 30 MG 24 hr tablet    Sig: Take 1 tablet (30 mg total) by mouth daily.    Dispense:  90 tablet    Refill:  3    Signed, [name], PA-C  10/11/2019 11:20 AM    [facility] [place], [place], [place]  [address] Phone: [phone]; Fax: [phone]

## 2019-10-11 ENCOUNTER — Encounter: Payer: Self-pay | Admitting: Physician Assistant

## 2019-10-11 ENCOUNTER — Other Ambulatory Visit: Payer: Self-pay

## 2019-10-11 ENCOUNTER — Ambulatory Visit: Payer: Medicare HMO | Admitting: Physician Assistant

## 2019-10-11 VITALS — BP 110/52 | HR 79 | Ht 70.0 in | Wt 231.2 lb

## 2019-10-11 DIAGNOSIS — I5032 Chronic diastolic (congestive) heart failure: Secondary | ICD-10-CM | POA: Diagnosis not present

## 2019-10-11 DIAGNOSIS — I10 Essential (primary) hypertension: Secondary | ICD-10-CM | POA: Diagnosis not present

## 2019-10-11 DIAGNOSIS — I130 Hypertensive heart and chronic kidney disease with heart failure and stage 1 through stage 4 chronic kidney disease, or unspecified chronic kidney disease: Secondary | ICD-10-CM | POA: Diagnosis not present

## 2019-10-11 DIAGNOSIS — N1831 Chronic kidney disease, stage 3a: Secondary | ICD-10-CM

## 2019-10-11 DIAGNOSIS — D649 Anemia, unspecified: Secondary | ICD-10-CM

## 2019-10-11 DIAGNOSIS — I25119 Atherosclerotic heart disease of native coronary artery with unspecified angina pectoris: Secondary | ICD-10-CM

## 2019-10-11 MED ORDER — LOSARTAN POTASSIUM 50 MG PO TABS
50.0000 mg | ORAL_TABLET | Freq: Every day | ORAL | 3 refills | Status: DC
Start: 2019-10-11 — End: 2020-01-03

## 2019-10-11 MED ORDER — ISOSORBIDE MONONITRATE ER 30 MG PO TB24
30.0000 mg | ORAL_TABLET | Freq: Every day | ORAL | 3 refills | Status: DC
Start: 2019-10-11 — End: 2020-01-03

## 2019-10-11 NOTE — Patient Instructions (Addendum)
Medication Instructions:  Your physician has recommended you make the following change in your medication:   1) Decrease Losartan to 50 mg, 1 tablet by mouth once a day 2) Start Imdur 30 mg, 1 tablet by mouth once a day  *If you need a refill on your cardiac medications before your next appointment, please call your pharmacy*  Lab Work: None ordered today  Testing/Procedures: None ordered today  Follow-Up: On 10/07/19 at 2:15PM with [name], PA-C

## 2019-10-16 ENCOUNTER — Telehealth: Payer: Self-pay | Admitting: Internal Medicine

## 2019-10-16 NOTE — Telephone Encounter (Signed)
Spoke with patient, offered next available with Dr. Carlean Purl, pt stated that he would be okay with seeing a PA sooner to be evaluated. Pt scheduled for follow up to see Alonza Bogus, PA on 10/29/19 at 1:30 pm

## 2019-10-21 ENCOUNTER — Other Ambulatory Visit: Payer: Self-pay

## 2019-10-21 ENCOUNTER — Encounter (INDEPENDENT_AMBULATORY_CARE_PROVIDER_SITE_OTHER): Payer: Medicare HMO | Admitting: Ophthalmology

## 2019-10-21 DIAGNOSIS — E11311 Type 2 diabetes mellitus with unspecified diabetic retinopathy with macular edema: Secondary | ICD-10-CM

## 2019-10-21 DIAGNOSIS — H35033 Hypertensive retinopathy, bilateral: Secondary | ICD-10-CM

## 2019-10-21 DIAGNOSIS — E113391 Type 2 diabetes mellitus with moderate nonproliferative diabetic retinopathy without macular edema, right eye: Secondary | ICD-10-CM

## 2019-10-21 DIAGNOSIS — E113512 Type 2 diabetes mellitus with proliferative diabetic retinopathy with macular edema, left eye: Secondary | ICD-10-CM | POA: Diagnosis not present

## 2019-10-21 DIAGNOSIS — I10 Essential (primary) hypertension: Secondary | ICD-10-CM | POA: Diagnosis not present

## 2019-10-21 DIAGNOSIS — H43813 Vitreous degeneration, bilateral: Secondary | ICD-10-CM

## 2019-10-29 ENCOUNTER — Ambulatory Visit: Payer: Medicare HMO | Admitting: Gastroenterology

## 2019-10-30 ENCOUNTER — Ambulatory Visit: Payer: Medicare HMO | Admitting: Physician Assistant

## 2019-10-31 DIAGNOSIS — E11610 Type 2 diabetes mellitus with diabetic neuropathic arthropathy: Secondary | ICD-10-CM | POA: Diagnosis not present

## 2019-10-31 DIAGNOSIS — I2510 Atherosclerotic heart disease of native coronary artery without angina pectoris: Secondary | ICD-10-CM | POA: Diagnosis not present

## 2019-10-31 DIAGNOSIS — E11319 Type 2 diabetes mellitus with unspecified diabetic retinopathy without macular edema: Secondary | ICD-10-CM | POA: Diagnosis not present

## 2019-10-31 DIAGNOSIS — E7800 Pure hypercholesterolemia, unspecified: Secondary | ICD-10-CM | POA: Diagnosis not present

## 2019-10-31 DIAGNOSIS — E1165 Type 2 diabetes mellitus with hyperglycemia: Secondary | ICD-10-CM | POA: Diagnosis not present

## 2019-10-31 DIAGNOSIS — Z0000 Encounter for general adult medical examination without abnormal findings: Secondary | ICD-10-CM | POA: Diagnosis not present

## 2019-10-31 DIAGNOSIS — D860 Sarcoidosis of lung: Secondary | ICD-10-CM | POA: Diagnosis not present

## 2019-10-31 DIAGNOSIS — E039 Hypothyroidism, unspecified: Secondary | ICD-10-CM | POA: Diagnosis not present

## 2019-10-31 DIAGNOSIS — Z794 Long term (current) use of insulin: Secondary | ICD-10-CM | POA: Diagnosis not present

## 2019-10-31 DIAGNOSIS — I10 Essential (primary) hypertension: Secondary | ICD-10-CM | POA: Diagnosis not present

## 2019-10-31 DIAGNOSIS — N183 Chronic kidney disease, stage 3 unspecified: Secondary | ICD-10-CM | POA: Diagnosis not present

## 2019-10-31 DIAGNOSIS — E1122 Type 2 diabetes mellitus with diabetic chronic kidney disease: Secondary | ICD-10-CM | POA: Diagnosis not present

## 2019-11-01 ENCOUNTER — Ambulatory Visit: Payer: Medicare HMO | Admitting: Physician Assistant

## 2019-11-06 ENCOUNTER — Ambulatory Visit: Payer: Medicare HMO | Admitting: Family

## 2019-11-06 ENCOUNTER — Encounter: Payer: Self-pay | Admitting: Family

## 2019-11-06 ENCOUNTER — Other Ambulatory Visit: Payer: Self-pay

## 2019-11-06 VITALS — Ht 70.0 in | Wt 231.0 lb

## 2019-11-06 DIAGNOSIS — M14672 Charcot's joint, left ankle and foot: Secondary | ICD-10-CM | POA: Diagnosis not present

## 2019-11-06 NOTE — Progress Notes (Signed)
Office Visit Note   Patient: [name]           Date of Birth: 03/05/1946           MRN: [id number] Visit Date: 11/06/2019              Requested by: Shirline Frees, MD [address] [place],  [place] [address] PCP: Shirline Frees, MD  Chief Complaint  Patient presents with  . Left Foot - Follow-up      HPI: Patient is a 74 year old gentleman who presents in follow-up for Charcot arthropathy of the left foot he has been in a cam walker for the last 7 weeks feels well he is not having any pain with ambulation in the cam walker no new symptoms.  No erythema or tenderness he does have some pain if he attempts to walk without the cam walker   Assessment & Plan: Visit Diagnoses:  1. Charcot's joint, left ankle and foot     Plan: Provided an order for custom orthotics he would like to use regular shoewear for his orthotics.  He will advance his weightbearing in regular shoewear once his orthotics are obtained.  Follow-Up Instructions: No follow-ups on file.   Ortho Exam  Patient is alert, oriented, no adenopathy, well-dressed, normal affect, normal respiratory effort. On examination of the left foot he does have a Charcot rocker-bottom deformity.  There is no erythema there is very very mild warmth to the plantar aspect of his foot there is no sign of infection active Charcot process no tenderness  Imaging: No results found. No images are attached to the encounter.  Labs: Lab Results  Component Value Date   HGBA1C 10.2 (H) 10/04/2018   HGBA1C (H) 08/15/2008    12.2 (NOTE) The ADA recommends the following therapeutic goal for glycemic control related to Hgb A1c measurement: Goal of therapy: <6.5 Hgb A1c  Reference: American Diabetes Association: Clinical Practice Recommendations 2010, Diabetes Care, 2010, 33: (Suppl  1).   HGBA1C (H) 11/27/2007    9.5 (NOTE)   The ADA recommends the following therapeutic goal for glycemic   control related to Hgb A1C  measurement:   Goal of Therapy:   < 7.0% Hgb A1C   Reference: American Diabetes Association: Clinical Practice   Recommendations 2008, Diabetes Care,  2008, 31:(Suppl 1).   LABURIC 7.2 10/30/2018   REPTSTATUS 11/10/2010 FINAL 11/09/2010   CULT NO GROWTH 11/09/2010     Lab Results  Component Value Date   ALBUMIN 2.9 (L) 10/07/2018   ALBUMIN 2.9 (L) 10/04/2018   ALBUMIN 3.2 (L) 10/03/2018   PREALBUMIN 21.9 10/04/2018   LABURIC 7.2 10/30/2018    Lab Results  Component Value Date   MG 1.7 10/06/2018   MG 1.6 (L) 10/04/2018   MG 1.8 10/03/2018   No results found for: VD25OH  Lab Results  Component Value Date   PREALBUMIN 21.9 10/04/2018   CBC EXTENDED Latest Ref Rng & Units 10/07/2018 10/06/2018 10/05/2018  WBC 4.0 - 10.5 K/uL 10.9(H) 11.3(H) 11.4(H)  RBC 4.22 - 5.81 MIL/uL 3.28(L) 3.10(L) 3.06(L)  HGB 13.0 - 17.0 g/dL 10.2(L) 9.6(L) 9.5(L)  HCT 39 - 52 % 31.5(L) 30.6(L) 29.5(L)  PLT 150 - 400 K/uL 244 237 240  NEUTROABS 1.7 - 7.7 K/uL - - -  LYMPHSABS 0.7 - 4.0 K/uL - - -     Body mass index is 33.15 kg/m.  Orders:  No orders of the defined types were placed in this encounter.  No orders of the defined types were placed in this encounter.    Procedures: No procedures performed  Clinical Data: No additional findings.  ROS:  All other systems negative, except as noted in the HPI. Review of Systems  Objective: Vital Signs: Ht 5\' 10"  (1.778 m)   Wt 231 lb (104.8 kg)   BMI 33.15 kg/m   Specialty Comments:  No specialty comments available.  PMFS History: Patient Active Problem List   Diagnosis Date Noted  . Charcot's joint, left ankle and foot 11/06/2019  . Peripheral arterial disease ([place]) 07/12/2019  . Pain in right elbow 10/30/2018  . Acute kidney injury superimposed on chronic kidney disease ([place]) 10/04/2018  . AKI (acute kidney injury) ([place]) 10/03/2018  . Hypoalbuminemia 10/03/2018  . Hypokalemia 10/03/2018  . Hypothyroidism 10/03/2018  . CKD  (chronic kidney disease), stage III ([place])   . Iron deficiency anemia   . Symptomatic anemia 06/29/2016  . Chronic diastolic heart failure ([place]) 01/14/2014  . Stable angina ([place]) 01/14/2014  . Obesity 01/14/2014  . Pulmonary sarcoidosis ([place]) 01/14/2014  . Uncontrolled diabetes mellitus ([place]) 01/14/2014  . Hyperlipidemia 01/14/2014  . Coronary atherosclerosis of native coronary artery   . Sinusitis, chronic 05/30/2011  . Sleep apnea 10/09/2008  . ADRENAL INSUFFICIENCY, HX OF 02/21/2008  . Coronary artery disease 11/20/2007  . Sarcoidosis 02/20/2007  . Diabetes mellitus without complication ([place]) 27/08/2374  . GERD 12/20/2006   Past Medical History:  Diagnosis Date  . Adrenal insufficiency ([place])   . Allergy   . Anemia   . Arthritis    feet   . CKD (chronic kidney disease), stage III   . Coronary artery disease    has stents  . Coronary atherosclerosis of native coronary artery    Proximal LAD, posterior lateral stent widely patent-10/12/11  . Diabetes mellitus    insulin and pills  . Hearing loss    wears hearing aids  . Heart attack ([place]) 2010  . History of blood transfusion 06/30/2016   [facility] - 2 units transfused  . Hypertension   . OSA (obstructive sleep apnea)    uses VPAC sleep study 2 years done through [place]. Dr. Marlou Porch arranged study  . Pneumonia    3-4 years ago  . Sarcoid   . Sleep apnea    uses CPAP nightly  . Thyroid disease   . Type 2 diabetes mellitus (HCC)     Family History  Problem Relation Age of Onset  . Kidney disease Mother   . Diabetes Mother   . Kidney cancer Mother   . Heart attack Father   . Asthma Sister   . Anesthesia problems Sister        "Kidney's did not wake up"  . Colon cancer Neg Hx   . Colon polyps Neg Hx   . Rectal cancer Neg Hx   . Stomach cancer Neg Hx     Past Surgical History:  Procedure Laterality Date  . APPENDECTOMY    . CATARACT EXTRACTION  2011   bilat  . CHOLECYSTECTOMY  01/25/2011   Procedure:  LAPAROSCOPIC CHOLECYSTECTOMY WITH INTRAOPERATIVE CHOLANGIOGRAM;  Surgeon: Judieth Keens, DO;  Location: [facility];  Service: General;  Laterality: N/A;  . COLONOSCOPY     several  . CORONARY ANGIOPLASTY     most recent 11/2009  . CORONARY STENT INTERVENTION N/A 08/07/2018   Procedure: CORONARY STENT INTERVENTION;  Surgeon: Jettie Booze, MD;  Location: [facility];  Service: Cardiovascular;  Laterality: N/A;  .  CORONARY STENT PLACEMENT  2009   in LAD and side branch PTCA  . INTERCOSTAL NERVE BLOCK  2011, 06/2016   x2. lumbar spine  . LEFT HEART CATHETERIZATION WITH CORONARY ANGIOGRAM Bilateral 10/12/2011   Procedure: LEFT HEART CATHETERIZATION WITH CORONARY ANGIOGRAM;  Surgeon: Candee Furbish, MD;  Location[facility];  Service: Cardiovascular;  Laterality: Bilateral;  . LEFT HEART CATHETERIZATION WITH CORONARY ANGIOGRAM N/A 04/01/2014   Procedure: LEFT HEART CATHETERIZATION WITH CORONARY ANGIOGRAM;  Surgeon: Candee Furbish, MD;  Location: [facility] C[facility] LAB;  Service: Cardiovascular;  Laterality: N/A;  . RIGHT/LEFT HEART CATH AND CORONARY ANGIOGRAPHY N/A 08/07/2018   Procedure: RIGHT/LEFT HEART CATH AND CORONARY ANGIOGRAPHY;  Surgeon: Jettie Booze, MD;  Location: [facility];  Service: Cardiovascular;  Laterality: N/A;   Social History   Occupational History  . 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 Smoking status: Never Smoker  . Smokeless tobacco: Never Used  Vaping Use  . Vaping Use: Never used  Substance and Sexual Activity  . Alcohol use: No  . Drug use: No  . Sexual activity: Not on file

## 2019-11-07 NOTE — Telephone Encounter (Signed)
Pt Is requesting a call back from a nurse in regards to scheduling an appt ASAP to be seen, pt states he is losing a lot of blood, Appt for 8/24 had to be canceled with Alonza Bogus

## 2019-11-07 NOTE — Telephone Encounter (Signed)
Patient has been scheduled with Carl Best, RNP at 11/28/19 10:00 Left message for patient to call back

## 2019-11-08 NOTE — Telephone Encounter (Signed)
Patient has been notified of appt date and time.

## 2019-11-13 ENCOUNTER — Other Ambulatory Visit: Payer: Self-pay | Admitting: Family

## 2019-11-13 ENCOUNTER — Inpatient Hospital Stay: Payer: Medicare HMO | Attending: Family | Admitting: Family

## 2019-11-13 ENCOUNTER — Other Ambulatory Visit: Payer: Self-pay

## 2019-11-13 ENCOUNTER — Inpatient Hospital Stay: Payer: Medicare HMO

## 2019-11-13 ENCOUNTER — Encounter: Payer: Self-pay | Admitting: Family

## 2019-11-13 ENCOUNTER — Telehealth: Payer: Self-pay | Admitting: Family

## 2019-11-13 VITALS — BP 117/52 | HR 55 | Temp 98.2°F | Resp 18 | Ht 70.0 in | Wt 231.4 lb

## 2019-11-13 DIAGNOSIS — Z9049 Acquired absence of other specified parts of digestive tract: Secondary | ICD-10-CM | POA: Diagnosis not present

## 2019-11-13 DIAGNOSIS — D649 Anemia, unspecified: Secondary | ICD-10-CM

## 2019-11-13 DIAGNOSIS — E1122 Type 2 diabetes mellitus with diabetic chronic kidney disease: Secondary | ICD-10-CM | POA: Diagnosis not present

## 2019-11-13 DIAGNOSIS — Z888 Allergy status to other drugs, medicaments and biological substances status: Secondary | ICD-10-CM | POA: Insufficient documentation

## 2019-11-13 DIAGNOSIS — Z8051 Family history of malignant neoplasm of kidney: Secondary | ICD-10-CM | POA: Insufficient documentation

## 2019-11-13 DIAGNOSIS — E1151 Type 2 diabetes mellitus with diabetic peripheral angiopathy without gangrene: Secondary | ICD-10-CM | POA: Insufficient documentation

## 2019-11-13 DIAGNOSIS — Z794 Long term (current) use of insulin: Secondary | ICD-10-CM | POA: Diagnosis not present

## 2019-11-13 DIAGNOSIS — Z7952 Long term (current) use of systemic steroids: Secondary | ICD-10-CM | POA: Diagnosis not present

## 2019-11-13 DIAGNOSIS — K922 Gastrointestinal hemorrhage, unspecified: Secondary | ICD-10-CM | POA: Diagnosis not present

## 2019-11-13 DIAGNOSIS — I2510 Atherosclerotic heart disease of native coronary artery without angina pectoris: Secondary | ICD-10-CM | POA: Insufficient documentation

## 2019-11-13 DIAGNOSIS — D500 Iron deficiency anemia secondary to blood loss (chronic): Secondary | ICD-10-CM | POA: Diagnosis not present

## 2019-11-13 DIAGNOSIS — D860 Sarcoidosis of lung: Secondary | ICD-10-CM | POA: Insufficient documentation

## 2019-11-13 DIAGNOSIS — Z841 Family history of disorders of kidney and ureter: Secondary | ICD-10-CM | POA: Insufficient documentation

## 2019-11-13 DIAGNOSIS — Z833 Family history of diabetes mellitus: Secondary | ICD-10-CM | POA: Insufficient documentation

## 2019-11-13 DIAGNOSIS — Z7982 Long term (current) use of aspirin: Secondary | ICD-10-CM | POA: Diagnosis not present

## 2019-11-13 DIAGNOSIS — N183 Chronic kidney disease, stage 3 unspecified: Secondary | ICD-10-CM | POA: Insufficient documentation

## 2019-11-13 DIAGNOSIS — M7989 Other specified soft tissue disorders: Secondary | ICD-10-CM | POA: Insufficient documentation

## 2019-11-13 DIAGNOSIS — R609 Edema, unspecified: Secondary | ICD-10-CM | POA: Diagnosis not present

## 2019-11-13 DIAGNOSIS — Z8249 Family history of ischemic heart disease and other diseases of the circulatory system: Secondary | ICD-10-CM | POA: Insufficient documentation

## 2019-11-13 DIAGNOSIS — Z836 Family history of other diseases of the respiratory system: Secondary | ICD-10-CM | POA: Diagnosis not present

## 2019-11-13 DIAGNOSIS — Z79899 Other long term (current) drug therapy: Secondary | ICD-10-CM | POA: Diagnosis not present

## 2019-11-13 DIAGNOSIS — Z7902 Long term (current) use of antithrombotics/antiplatelets: Secondary | ICD-10-CM | POA: Insufficient documentation

## 2019-11-13 DIAGNOSIS — I739 Peripheral vascular disease, unspecified: Secondary | ICD-10-CM | POA: Diagnosis not present

## 2019-11-13 LAB — RETICULOCYTES
Immature Retic Fract: 26.9 % — ABNORMAL HIGH (ref 2.3–15.9)
RBC.: 3.47 MIL/uL — ABNORMAL LOW (ref 4.22–5.81)
Retic Count, Absolute: 73.2 10*3/uL (ref 19.0–186.0)
Retic Ct Pct: 2.1 % (ref 0.4–3.1)

## 2019-11-13 LAB — CBC WITH DIFFERENTIAL (CANCER CENTER ONLY)
Abs Immature Granulocytes: 0.2 10*3/uL — ABNORMAL HIGH (ref 0.00–0.07)
Basophils Absolute: 0.1 10*3/uL (ref 0.0–0.1)
Basophils Relative: 1 %
Eosinophils Absolute: 0.1 10*3/uL (ref 0.0–0.5)
Eosinophils Relative: 1 %
HCT: 30.9 % — ABNORMAL LOW (ref 39.0–52.0)
Hemoglobin: 9.3 g/dL — ABNORMAL LOW (ref 13.0–17.0)
Immature Granulocytes: 2 %
Lymphocytes Relative: 15 %
Lymphs Abs: 1.5 10*3/uL (ref 0.7–4.0)
MCH: 26.9 pg (ref 26.0–34.0)
MCHC: 30.1 g/dL (ref 30.0–36.0)
MCV: 89.3 fL (ref 80.0–100.0)
Monocytes Absolute: 0.9 10*3/uL (ref 0.1–1.0)
Monocytes Relative: 9 %
Neutro Abs: 7.4 10*3/uL (ref 1.7–7.7)
Neutrophils Relative %: 72 %
Platelet Count: 314 10*3/uL (ref 150–400)
RBC: 3.46 MIL/uL — ABNORMAL LOW (ref 4.22–5.81)
RDW: 16.3 % — ABNORMAL HIGH (ref 11.5–15.5)
WBC Count: 10.1 10*3/uL (ref 4.0–10.5)
nRBC: 0 % (ref 0.0–0.2)

## 2019-11-13 LAB — CMP (CANCER CENTER ONLY)
ALT: 23 U/L (ref 0–44)
AST: 16 U/L (ref 15–41)
Albumin: 3.5 g/dL (ref 3.5–5.0)
Alkaline Phosphatase: 61 U/L (ref 38–126)
Anion gap: 8 (ref 5–15)
BUN: 28 mg/dL — ABNORMAL HIGH (ref 8–23)
CO2: 30 mmol/L (ref 22–32)
Calcium: 9.1 mg/dL (ref 8.9–10.3)
Chloride: 103 mmol/L (ref 98–111)
Creatinine: 1.67 mg/dL — ABNORMAL HIGH (ref 0.61–1.24)
GFR, Est AFR Am: 46 mL/min — ABNORMAL LOW (ref >60)
GFR, Estimated: 40 mL/min — ABNORMAL LOW (ref >60)
Glucose, Bld: 233 mg/dL — ABNORMAL HIGH (ref 70–99)
Potassium: 3.8 mmol/L (ref 3.5–5.1)
Sodium: 141 mmol/L (ref 135–145)
Total Bilirubin: 0.4 mg/dL (ref 0.3–1.2)
Total Protein: 6.4 g/dL — ABNORMAL LOW (ref 6.5–8.1)

## 2019-11-13 LAB — SAVE SMEAR(SSMR), FOR PROVIDER SLIDE REVIEW

## 2019-11-13 LAB — SAMPLE TO BLOOD BANK

## 2019-11-13 NOTE — Telephone Encounter (Signed)
Appointments scheduled calendar printed & mailed per 9/8 los

## 2019-11-13 NOTE — Progress Notes (Signed)
Hematology/Oncology Consultation   Name: [name]      MRN: [id number]    Location: Room/bed info not found  Date: 11/13/2019 Time:1:17 PM   REFERRING PHYSICIAN: Kiera A. Leonides Sake, PA  REASON FOR CONSULT: Anemia, unspecified    DIAGNOSIS: Anemia secondary to GI blood loss and mild reactive leukocytosis (Sarcoidosis/steroids)  HISTORY OF PRESENT ILLNESS: [name] is a very pleasant 74 yo caucasian gentleman with history of iron deficiency anemia secondary to intermittent GI blood loss.  His last colonoscopy was in June 2018 with Dr. Carlean Purl and had a benign polyp removed.  FOBT with PCP in June of this year was positive for blood. He has been referred back to GI (Dr. Carlean Purl) for further work up as well as here for treatment of the anemia.  He has been transfused in the past (most recently 2 years ago) but has never received IV iron.  Hgb today is 9.3, MCV 89, retic count 2.1, WBC count 10.1 and platelets 314.  He has multiple stents placed in the heart and states that one of the stents is 60% blocked which will eventually need to be addressed. This can cause him to have SOB with over exertion at times. His cardiologist is Dr. Adora Fridge.  He is on Plavix and 1 baby aspirin daily. He does bruise easily.  He has had skin cancers removed from his left arm and will be having 3 removed from the top of his head soon. No history of melanoma.  His mother had kidney cancer.  He has pulmonary sarcoidosis and is on steroids (prednisone 10 mg PO daily) long term. He has associated reactive leukocytosis.  He has Charcot collapse of the left foot and is still wearing a support boot. He is followed by Dr. Sharol Given This was secondary to significant peripheral vascular disease and blocked arteries in the left leg.  He states that he had a mold of his foot taken recently and is waiting for custom arch supports.  He is diabetic and his blood sugars are not well controlled. He is currently on insulin and Glipizide. Hgb A1c  in July was 10.2.  He has chronic kidney disease secondary to the diabetes.  He goes monthly for steroid injections in the eyes.  No fever, chills, n/v, cough, rash, dizziness, chest pain, palpitations, abdominal pain or changes in bowel or bladder habits.  He has swelling in the feet and ankles that waxes and wanes. He states that taking the lasix daily helps reduce the fluid retention.  No falls or syncopal episodes to report.  He has maintained a good appetite and is staying well hydrated. His weight is stable at 231 lbs.  No smoking, ETOH or recreational drug use.   ROS: All other 10 point review of systems is negative.   PAST MEDICAL HISTORY:   Past Medical History:  Diagnosis Date   Adrenal insufficiency ([place])    Allergy    Anemia    Arthritis    feet    CKD (chronic kidney disease), stage III    Coronary artery disease    has stents   Coronary atherosclerosis of native coronary artery    Proximal LAD, posterior lateral stent widely patent-10/12/11   Diabetes mellitus    insulin and pills   Hearing loss    wears hearing aids   Heart attack ([place]) 2010   History of blood transfusion 06/30/2016   [facility] - 2 units transfused   Hypertension    OSA (obstructive  sleep apnea)    uses VPAC sleep study 2 years done through [facility]. Dr. Marlou Porch arranged study   Pneumonia    3-4 years ago   Sarcoid    Sleep apnea    uses CPAP nightly   Thyroid disease    Type 2 diabetes mellitus ([facility])     ALLERGIES: Allergies  Allergen Reactions   Hydrocortisone Nausea Only   Metolazone     Drained, no energy   Statins Other (See Comments)    Pt says they make him "mean"      MEDICATIONS:  Current Outpatient Medications on File Prior to Visit  Medication Sig Dispense Refill   acetaminophen (TYLENOL) 500 MG tablet Take 500 mg by mouth daily as needed for moderate pain or headache.      albuterol (PROVENTIL) (2.5 MG/3ML) 0.083% nebulizer solution Take 3 mLs (2.5  mg total) by nebulization every 6 (six) hours as needed for wheezing or shortness of breath. Dx Code D86.0 360 mL 5   Albuterol Sulfate (PROAIR RESPICLICK) 607 (90 BASE) MCG/ACT AEPB Inhale 2 puffs into the lungs every 6 (six) hours as needed. 1 each 6   allopurinol (ZYLOPRIM) 100 MG tablet TAKE 1 TABLET BY MOUTH TWICE A DAY 180 tablet 1   aspirin EC 81 MG tablet Take 81 mg by mouth every evening.      Cholecalciferol (VITAMIN D3) 3000 UNITS TABS Take 3,000 Units by mouth daily.     clopidogrel (PLAVIX) 75 MG tablet Take 1 tablet (75 mg total) by mouth daily. 90 tablet 4   Coenzyme Q10 (COQ-10) 400 MG CAPS Take 400 mg by mouth every evening.     Colchicine 0.6 MG CAPS Take 1 tablet by mouth 2 (two) times daily as needed. 14 capsule 1   doxycycline (VIBRA-TABS) 100 MG tablet Take 1 tablet (100 mg total) by mouth 2 (two) times daily. 28 tablet 0   DROPLET INSULIN SYRINGE 31G X 5/16" 1 ML MISC      fish oil-omega-3 fatty acids 1000 MG capsule Take 1 g by mouth daily.      furosemide (LASIX) 80 MG tablet Take 1 tablet (80 mg total) by mouth daily. 30 tablet 0   glipiZIDE (GLUCOTROL) 10 MG tablet Take 10 mg by mouth every evening.      insulin aspart protamine- aspart (NOVOLOG MIX 70/30) (70-30) 100 UNIT/ML injection Inject 0.3 mLs (30 Units total) into the skin 2 (two) times daily with a meal.     isosorbide mononitrate (IMDUR) 30 MG 24 hr tablet Take 1 tablet (30 mg total) by mouth daily. 90 tablet 3   levothyroxine (SYNTHROID, LEVOTHROID) 50 MCG tablet Take 50 mcg by mouth daily.      losartan (COZAAR) 50 MG tablet Take 1 tablet (50 mg total) by mouth daily. 90 tablet 3   montelukast (SINGULAIR) 10 MG tablet Take 10 mg by mouth daily as needed (allergies).      Multiple Vitamin (MULTIVITAMIN WITH MINERALS) TABS tablet Take 1 tablet by mouth daily.     pantoprazole (PROTONIX) 40 MG tablet Take 40 mg by mouth daily.     predniSONE (DELTASONE) 10 MG tablet Take 1 tablet (10 mg  total) by mouth daily with breakfast. T 30 tablet 6   rosuvastatin (CRESTOR) 10 MG tablet TAKE 1 TABLET AT BEDTIME 90 tablet 3   sodium chloride (OCEAN) 0.65 % SOLN nasal spray Place 1 spray into both nostrils as needed for congestion.     Vitamin D, Ergocalciferol, (DRISDOL)  50000 UNITS CAPS capsule Take 50,000 Units by mouth every Sunday.   1   No current facility-administered medications on file prior to visit.     PAST SURGICAL HISTORY Past Surgical History:  Procedure Laterality Date   APPENDECTOMY     CATARACT EXTRACTION  2011   bilat   CHOLECYSTECTOMY  01/25/2011   Procedure: LAPAROSCOPIC CHOLECYSTECTOMY WITH INTRAOPERATIVE CHOLANGIOGRAM;  Surgeon: Judieth Keens, DO;  Location: [facility];  Service: General;  Laterality: N/A;   COLONOSCOPY     several   CORONARY ANGIOPLASTY     most recent 11/2009   CORONARY STENT INTERVENTION N/A 08/07/2018   Procedure: CORONARY STENT INTERVENTION;  Surgeon: Jettie Booze, MD;  Location: [facility];  Service: Cardiovascular;  Laterality: N/A;   CORONARY STENT PLACEMENT  2009   in LAD and side branch PTCA   INTERCOSTAL NERVE BLOCK  2011, 06/2016   x2. lumbar spine   LEFT HEART CATHETERIZATION WITH CORONARY ANGIOGRAM Bilateral 10/12/2011   Procedure: LEFT HEART CATHETERIZATION WITH CORONARY ANGIOGRAM;  Surgeon: Candee Furbish, MD;  Location: [facility];  Service: Cardiovascular;  Laterality: Bilateral;   LEFT HEART CATHETERIZATION WITH CORONARY ANGIOGRAM N/A 04/01/2014   Procedure: LEFT HEART CATHETERIZATION WITH CORONARY ANGIOGRAM;  Surgeon: Candee Furbish, MD;  Location: [facility] CATH LAB;  Service: Cardiovascular;  Laterality: N/A;   RIGHT/LEFT HEART CATH AND CORONARY ANGIOGRAPHY N/A 08/07/2018   Procedure: RIGHT/LEFT HEART CATH AND CORONARY ANGIOGRAPHY;  Surgeon: Jettie Booze, MD;  Location: [facility];  Service: Cardiovascular;  Laterality: N/A;    FAMILY HISTORY: Family History  Problem Relation Age of Onset    Kidney disease Mother    Diabetes Mother    Kidney cancer Mother    Heart attack Father    Asthma Sister    Anesthesia problems Sister        "Kidney's did not wake up"   Colon cancer Neg Hx    Colon polyps Neg Hx    Rectal cancer Neg Hx    Stomach cancer Neg Hx     SOCIAL HISTORY:  reports that he has never smoked. He has never used smokeless tobacco. He reports that he does not drink alcohol and does not use drugs.  PERFORMANCE STATUS: The patient's performance status is 1 - Symptomatic but completely ambulatory  PHYSICAL EXAM: Most Recent Vital Signs: There were no vitals taken for this visit. BP (!) 117/52 (BP Location: Left Arm, Patient Position: Sitting)    Pulse (!) 55    Temp 98.2 F (36.8 C) (Oral)    Resp 18    Ht 5\' 10"  (1.778 m)    Wt 231 lb 6.4 oz (105 kg)    SpO2 99%    BMI 33.20 kg/m   General Appearance:    Alert, cooperative, no distress, appears stated age  Head:    Normocephalic, without obvious abnormality, atraumatic  Eyes:    PERRL, conjunctiva/corneas clear, EOM's intact, fundi    benign, both eyes             Throat:   Lips, mucosa, and tongue normal; teeth and gums normal  Neck:   Supple, symmetrical, trachea midline, no adenopathy;       thyroid:  No enlargement/tenderness/nodules; no carotid   bruit or JVD  Back:     Symmetric, no curvature, ROM normal, no CVA tenderness  Lungs:     Clear to auscultation bilaterally, respirations unlabored  Chest wall:    No tenderness or  deformity  Heart:    Regular rate and rhythm, S1 and S2 normal, no murmur, rub   or gallop  Abdomen:     Soft, non-tender, bowel sounds active all four quadrants,    no masses, no organomegaly        Extremities:   Extremities normal, atraumatic, no cyanosis or edema  Pulses:   2+ and symmetric all extremities  Skin:   Skin color, texture, turgor normal, no rashes or lesions  Lymph nodes:   Cervical, supraclavicular, and axillary nodes normal  Neurologic:    CNII-XII intact. Normal strength, sensation and reflexes      throughout    LABORATORY DATA:  Results for orders placed or performed in visit on 11/13/19 (from the past 48 hour(s))  CBC with Differential ([facility] Only)     Status: Abnormal   Collection Time: 11/13/19 12:51 PM  Result Value Ref Range   WBC Count 10.1 4.0 - 10.5 K/uL   RBC 3.46 (L) 4.22 - 5.81 MIL/uL   Hemoglobin 9.3 (L) 13.0 - 17.0 g/dL   HCT 30.9 (L) 39 - 52 %   MCV 89.3 80.0 - 100.0 fL   MCH 26.9 26.0 - 34.0 pg   MCHC 30.1 30.0 - 36.0 g/dL   RDW 16.3 (H) 11.5 - 15.5 %   Platelet Count 314 150 - 400 K/uL   nRBC 0.0 0.0 - 0.2 %   Neutrophils Relative % 72 %   Neutro Abs 7.4 1.7 - 7.7 K/uL   Lymphocytes Relative 15 %   Lymphs Abs 1.5 0.7 - 4.0 K/uL   Monocytes Relative 9 %   Monocytes Absolute 0.9 0 - 1 K/uL   Eosinophils Relative 1 %   Eosinophils Absolute 0.1 0 - 0 K/uL   Basophils Relative 1 %   Basophils Absolute 0.1 0 - 0 K/uL   Immature Granulocytes 2 %   Abs Immature Granulocytes 0.20 (H) 0.00 - 0.07 K/uL    Comment: Performed at [facility] at [facility], [address] [place], [place] [address]  Save [facility])     Status: None   Collection Time: 11/13/19 12:51 PM  Result Value Ref Range   Smear Review SMEAR STAINED AND AVAILABLE FOR REVIEW     Comment: Performed at [facility] at [facility], [address], [place], [place] [address]  Reticulocytes     Status: Abnormal   Collection Time: 11/13/19 12:52 PM  Result Value Ref Range   Retic Ct Pct 2.1 0.4 - 3.1 %   RBC. 3.47 (L) 4.22 - 5.81 MIL/uL   Retic Count, Absolute 73.2 19.0 - 186.0 K/uL   Immature Retic Fract 26.9 (H) 2.3 - 15.9 %    Comment: Performed at [facility] at [facility], [address], [place], [place] [address]      RADIOGRAPHY: No results found.     PATHOLOGY: None  ASSESSMENT/PLAN: Mr. Huseby is a very pleasant 74 yo caucasian  gentleman with history of iron deficiency anemia secondary to intermittent GI blood loss.  He has an appointment with GI on 11/27/2019.  We will see what his iron levels and epo level look and replace if needed.  We will go ahead and plan to see him again in a month.   All questions were answered and he is in agreement with the plan. He can contact our office with any questions or concerns.  We can certainly see him sooner if necessary.   Laverna Peace, NP

## 2019-11-14 LAB — ERYTHROPOIETIN: Erythropoietin: 66 m[IU]/mL — ABNORMAL HIGH (ref 2.6–18.5)

## 2019-11-14 LAB — FERRITIN: Ferritin: 26 ng/mL (ref 24–336)

## 2019-11-14 LAB — LACTATE DEHYDROGENASE: LDH: 181 U/L (ref 98–192)

## 2019-11-14 LAB — IRON AND TIBC
Iron: 25 ug/dL — ABNORMAL LOW (ref 42–163)
Saturation Ratios: 8 % — ABNORMAL LOW (ref 20–55)
TIBC: 328 ug/dL (ref 202–409)
UIBC: 302 ug/dL (ref 117–376)

## 2019-11-15 ENCOUNTER — Other Ambulatory Visit: Payer: Self-pay | Admitting: Family

## 2019-11-18 ENCOUNTER — Telehealth: Payer: Self-pay | Admitting: Family

## 2019-11-18 NOTE — Telephone Encounter (Signed)
I called and LMVM for patient regarding appointments scheduled for iron infusion.  I asked that he call back to confirms dates/times Per 9/10 sch msg

## 2019-11-25 ENCOUNTER — Encounter (INDEPENDENT_AMBULATORY_CARE_PROVIDER_SITE_OTHER): Payer: Medicare HMO | Admitting: Ophthalmology

## 2019-11-25 ENCOUNTER — Inpatient Hospital Stay: Payer: Medicare HMO

## 2019-11-27 ENCOUNTER — Encounter (INDEPENDENT_AMBULATORY_CARE_PROVIDER_SITE_OTHER): Payer: Medicare HMO | Admitting: Ophthalmology

## 2019-11-28 ENCOUNTER — Encounter: Payer: Self-pay | Admitting: Nurse Practitioner

## 2019-11-28 ENCOUNTER — Telehealth: Payer: Self-pay | Admitting: General Surgery

## 2019-11-28 ENCOUNTER — Other Ambulatory Visit (INDEPENDENT_AMBULATORY_CARE_PROVIDER_SITE_OTHER): Payer: Medicare HMO

## 2019-11-28 ENCOUNTER — Ambulatory Visit: Payer: Medicare HMO | Admitting: Nurse Practitioner

## 2019-11-28 VITALS — BP 128/54 | HR 62 | Ht 70.0 in | Wt 231.5 lb

## 2019-11-28 DIAGNOSIS — D509 Iron deficiency anemia, unspecified: Secondary | ICD-10-CM | POA: Diagnosis not present

## 2019-11-28 DIAGNOSIS — R195 Other fecal abnormalities: Secondary | ICD-10-CM | POA: Diagnosis not present

## 2019-11-28 LAB — CBC
HCT: 30.5 % — ABNORMAL LOW (ref 39.0–52.0)
Hemoglobin: 9.8 g/dL — ABNORMAL LOW (ref 13.0–17.0)
MCHC: 32 g/dL (ref 30.0–36.0)
MCV: 84.6 fl (ref 78.0–100.0)
Platelets: 469 10*3/uL — ABNORMAL HIGH (ref 150.0–400.0)
RBC: 3.6 Mil/uL — ABNORMAL LOW (ref 4.22–5.81)
RDW: 18.3 % — ABNORMAL HIGH (ref 11.5–15.5)
WBC: 13.4 10*3/uL — ABNORMAL HIGH (ref 4.0–10.5)

## 2019-11-28 NOTE — Patient Instructions (Signed)
If you are age 75 or older, your body mass index should be between 23-30. Your Body mass index is 33.22 kg/m. If this is out of the aforementioned range listed, please consider follow up with your Primary Care Provider.  If you are age 44 or younger, your body mass index should be between 19-25. Your Body mass index is 33.22 kg/m. If this is out of the aformentioned range listed, please consider follow up with your Primary Care Provider.   You will be contaced by our office prior to your procedure for directions on holding your Plavix  If you do not hear from our office 1 week prior to your scheduled procedure, please call [phone] to discuss.   Your provider has requested that you go to the basement level for lab work before leaving today. Press "B" on the elevator. The lab is located at the first door on the left as you exit the elevator.  Please request a copy of your FOBT from your Primary Dr.  Due to recent changes in healthcare laws, you may see the results of your imaging and laboratory studies on MyChart before your provider has had a chance to review them.  We understand that in some cases there may be results that are confusing or concerning to you. Not all laboratory results come back in the same time frame and the provider may be waiting for multiple results in order to interpret others.  Please give [place] 48 hours in order for your provider to thoroughly review all the results before contacting the office for clarification of your results.    Marland Kitchen

## 2019-11-28 NOTE — Telephone Encounter (Signed)
[facility] Pre-operative Risk Assessment and Cardiac Clearance    Request for Cardiac clearance:     Endoscopy Procedure  What type of surgery is being performed?     Colonoscopy/Endoscopy  When is this surgery scheduled?     01/19/2020  What type of clearance is required ?  Cardiac clearance and  Pharmacy clearance.  Are there any medications that need to be held prior to surgery and how long? Plavix 3-5 days  Practice name and name of physician performing surgery?      [facility]  What is your office phone and fax number?      Phone- [phone]  [phone]  Anesthesia type (None, local, MAC, general) ?       MAC

## 2019-11-28 NOTE — Progress Notes (Signed)
11/28/2019 STEELE STRACENER [id number] 1946/01/04   CHIEF COMPLAINT: anemia, blood in stool  HISTORY OF PRESENT ILLNESS:  [name] is a 74 year old male with a past medical history of arthritis, coronary artery disease MI 2010 with a total of 5 coronary stents placed in 2010, 2013 and 2020 on Plavix and ASA, DM II, CKD stage III, OSA uses cpap, sarcoidosis, chronic adrenal insufficiency on chronic Prednisone and iron deficiency anemia. Past cholecystectomy and appendectomy.  He presents to our office today for further evaluation regarding iron deficiency anemia with a positive FOBT as reported by Dr. Kenton Kingfisher.  Laboratory studies 11/13/2019 showed a WBC 10.1.  Hemoglobin 9.3.  Hematocrit 30.9.  Platelet 314.  MCV 89.3.  Iron 25.  Iron saturation 8.  Ferritin 26.  He was referred to hematology, evaluated by Laverna Peace NP for IV iron infusions. He stated 5 iron infusions were ordered  but have not yet been scheduled as the patient is waiting to hear back from the hematology office regarding insurance coverage and cost of the IV iron.  He has a prior history of iron deficiency anemia as evaluated by Dr. Carlean Purl in 2018.  An EGD done 07/20/2016 was normal.  A colonoscopy on the same date showed a poor prep and a 15 mm polyp was removed from the transverse colon.  Biopsy showed inflammatory polyp without notes of dysplasia or malignancy.  A repeat colonoscopy was done 08/31/2016 with a 2-day prep, the prior polypectomy site tattoo was intact without evidence of residual polyp no other abnormalities were identified.  He was prescribed oral iron which he continues to take daily.  He denies having any dysphagia, heartburn or upper abdominal pain.  No lower abdominal pain.  He is passing a normal formed brown bowel movement most days.  No rectal bleeding or melena.  History of CAD with past placement of 5 coronary stents.  He remains on Plavix an ASA 81mg  daily. No other NSAIDS. He developed SOB in August 2021. He  was evaluated by his [name] cardiology PA-C on 10/11/2019. A 12 lead EKG was stable, no changes since his prior EKG.  He was advised to proceed with his GI evaluation for anemia and + FOBT. No further cardiac evaluation/cardiac catheterization was recommended at that time.  He takes Pantoprazole 40mg  daily for past GERD.  He remains on Prednisone 10mg  daily for Sarcoidosis which he reports is in  remission.    CBC Latest Ref Rng & Units 11/13/2019 10/07/2018 10/06/2018  WBC 4.0 - 10.5 K/uL 10.1 10.9(H) 11.3(H)  Hemoglobin 13.0 - 17.0 g/dL 9.3(L) 10.2(L) 9.6(L)  Hematocrit 39 - 52 % 30.9(L) 31.5(L) 30.6(L)  Platelets 150 - 400 K/uL 314 244 237  MCV 89.3  CMP Latest Ref Rng & Units 11/13/2019 10/07/2018 10/06/2018  Glucose 70 - 99 mg/dL 233(H) 197(H) 213(H)  BUN 8 - 23 mg/dL 28(H) 27(H) 30(H)  Creatinine 0.61 - 1.24 mg/dL 1.67(H) 2.35(H) 2.51(H)  Sodium 135 - 145 mmol/L 141 141 139  Potassium 3.5 - 5.1 mmol/L 3.8 3.5 3.8  Chloride 98 - 111 mmol/L 103 104 106  CO2 22 - 32 mmol/L 30 26 26   Calcium 8.9 - 10.3 mg/dL 9.1 8.6(L) 8.2(L)  Total Protein 6.5 - 8.1 g/dL 6.4(L) - -  Total Bilirubin 0.3 - 1.2 mg/dL 0.4 - -  Alkaline Phos 38 - 126 U/L 61 - -  AST 15 - 41 U/L 16 - -  ALT 0 - 44 U/L 23 - -  Iron/TIBC/Ferritin/ %Sat    Component Value Date/Time   IRON 25 (L) 11/13/2019 1252   TIBC 328 11/13/2019 1252   FERRITIN 26 11/13/2019 1252   IRONPCTSAT 8 (L) 11/13/2019 1252    Colonoscopy 08/31/2016 by Dr. Carlean Purl: - A tattoo was seen in the transverse colon. A post-polypectomy scar was found at the tattoo site. There was no evidence of residual polyp tissue. - The examination was otherwise normal on direct and retroflexion views. - No specimens collected. Note: An abdominal binder was placed on his abdomen and he had a double prep used as last time could not complete colonoscopy due to poor prep and looping.  Colonoscopy 07/20/2016: - Preparation of the colon was unsatisfactory. - The  colonoscopy was performed with difficulty due to unsatisfactory   bowel prep and significant looping. - One 15 mm polyp in the transverse colon, removed with a hot snare. Resected and   retrieved. Tattooed. It was soft but ulcerated - ? inflammatory vs neoplastic. Could account   for chronic blood loss and his anemia - Stool in the rectum, in the sigmoid colon, in the descending colon and in the transverse  colon. - Repeat colonoscopy with 2 day prep - Needs to have extralarge abdominal binder available in [facility] day we do colonoscopy Surgical [P], transverse, polyp - INFLAMMATORY POLYP WITH EXUDATE AND EROSION. - NO GRANULOMAS, ADENOMATOUS CHANGE OR MALIGNANCY.   EGD 07/20/2016: Normal esophagus. - Normal stomach. - Normal examined duodenum. - No specimens collected  Past Medical History:  Diagnosis Date  . Adrenal insufficiency ([place])   . Allergy   . Anemia   . Arthritis    feet   . CKD (chronic kidney disease), stage III   . Coronary artery disease    has stents  . Coronary atherosclerosis of native coronary artery    Proximal LAD, posterior lateral stent widely patent-10/12/11  . Diabetes mellitus    insulin and pills  . Hearing loss    wears hearing aids  . Heart attack ([place]) 2010  . History of blood transfusion 06/30/2016   [facility] - 2 units transfused  . Hypertension   . OSA (obstructive sleep apnea)    uses VPAC sleep study 2 years done through [facility]. Dr. Marlou Porch arranged study  . Pneumonia    3-4 years ago  . Sarcoid   . Sleep apnea    uses CPAP nightly  . Thyroid disease   . Type 2 diabetes mellitus ([place])    Past Surgical History:  Procedure Laterality Date  . APPENDECTOMY    . CATARACT EXTRACTION  2011   bilat  . CHOLECYSTECTOMY  01/25/2011   Procedure: LAPAROSCOPIC CHOLECYSTECTOMY WITH INTRAOPERATIVE CHOLANGIOGRAM;  Surgeon: Judieth Keens, DO;  Location: [facility];  Service: General;  Laterality: N/A;  . COLONOSCOPY     several  . CORONARY  ANGIOPLASTY     most recent 11/2009  . CORONARY STENT INTERVENTION N/A 08/07/2018   Procedure: CORONARY STENT INTERVENTION;  Surgeon: Jettie Booze, MD;  Location: [facility];  Service: Cardiovascular;  Laterality: N/A;  . CORONARY STENT PLACEMENT  2009   in LAD and side branch PTCA  . INTERCOSTAL NERVE BLOCK  2011, 06/2016   x2. lumbar spine  . LEFT HEART CATHETERIZATION WITH CORONARY ANGIOGRAM Bilateral 10/12/2011   Procedure: LEFT HEART CATHETERIZATION WITH CORONARY ANGIOGRAM;  Surgeon: Candee Furbish, MD;  Location: [facility] CATH LAB;  Service: Cardiovascular;  Laterality: Bilateral;  . LEFT HEART CATHETERIZATION WITH CORONARY ANGIOGRAM N/A  04/01/2014   Procedure: LEFT HEART CATHETERIZATION WITH CORONARY ANGIOGRAM;  Surgeon: Candee Furbish, MD;  Location: [facility] CATH LAB;  Service: Cardiovascular;  Laterality: N/A;  . RIGHT/LEFT HEART CATH AND CORONARY ANGIOGRAPHY N/A 08/07/2018   Procedure: RIGHT/LEFT HEART CATH AND CORONARY ANGIOGRAPHY;  Surgeon: Jettie Booze, MD;  Location: [facility];  Service: Cardiovascular;  Laterality: N/A;    Social History: He is married.  He is retired.  He has 2 sons.  He is a non-smoker.  No alcohol.  No drug use.  Family History: Father died 35 MI. Mother died age 32 kidney cancer, history of DM.  Sister died kidney failure. Other sister deceaed, detail unknown.   Allergies  Allergen Reactions  . Statins Other (See Comments)    Pt says they make him "mean"  . Hydrocortisone Nausea Only  . Metolazone Other (See Comments)    Drained, no energy      Outpatient Encounter Medications as of 11/28/2019  Medication Sig  . acetaminophen (TYLENOL) 500 MG tablet Take 500 mg by mouth daily as needed for moderate pain or headache.   . albuterol (PROVENTIL) (2.5 MG/3ML) 0.083% nebulizer solution Take 3 mLs (2.5 mg total) by nebulization every 6 (six) hours as needed for wheezing or shortness of breath. Dx Code D86.0  . Albuterol Sulfate (PROAIR RESPICLICK)  782 (90 BASE) MCG/ACT AEPB Inhale 2 puffs into the lungs every 6 (six) hours as needed.  Marland Kitchen allopurinol (ZYLOPRIM) 100 MG tablet TAKE 1 TABLET BY MOUTH TWICE A DAY  . aspirin EC 81 MG tablet Take 81 mg by mouth every evening.   . Cholecalciferol (VITAMIN D3) 3000 UNITS TABS Take 3,000 Units by mouth daily.  . clopidogrel (PLAVIX) 75 MG tablet Take 1 tablet (75 mg total) by mouth daily.  . Coenzyme Q10 (COQ-10) 400 MG CAPS Take 400 mg by mouth every evening.  . Colchicine 0.6 MG CAPS Take 1 tablet by mouth 2 (two) times daily as needed.  . DROPLET INSULIN SYRINGE 31G X 5/16" 1 ML MISC   . fish oil-omega-3 fatty acids 1000 MG capsule Take 1 g by mouth daily.   . furosemide (LASIX) 80 MG tablet Take 1 tablet (80 mg total) by mouth daily.  Marland Kitchen glipiZIDE (GLUCOTROL) 10 MG tablet Take 10 mg by mouth every evening.   . insulin aspart protamine- aspart (NOVOLOG MIX 70/30) (70-30) 100 UNIT/ML injection Inject 0.3 mLs (30 Units total) into the skin 2 (two) times daily with a meal.  . isosorbide mononitrate (IMDUR) 30 MG 24 hr tablet Take 1 tablet (30 mg total) by mouth daily.  Marland Kitchen levothyroxine (SYNTHROID, LEVOTHROID) 50 MCG tablet Take 50 mcg by mouth daily.   Marland Kitchen losartan (COZAAR) 50 MG tablet Take 1 tablet (50 mg total) by mouth daily.  . montelukast (SINGULAIR) 10 MG tablet Take 10 mg by mouth daily as needed (allergies).   . Multiple Vitamin (MULTIVITAMIN WITH MINERALS) TABS tablet Take 1 tablet by mouth daily.  . pantoprazole (PROTONIX) 40 MG tablet Take 40 mg by mouth daily.  . predniSONE (DELTASONE) 10 MG tablet Take 1 tablet (10 mg total) by mouth daily with breakfast. T  . rosuvastatin (CRESTOR) 10 MG tablet TAKE 1 TABLET AT BEDTIME  . sodium chloride (OCEAN) 0.65 % SOLN nasal spray Place 1 spray into both nostrils as needed for congestion.  . Vitamin D, Ergocalciferol, (DRISDOL) 50000 UNITS CAPS capsule Take 50,000 Units by mouth every Sunday.    No facility-administered encounter medications on  file  as of 11/28/2019.     REVIEW OF SYSTEMS: Gen: Denies fever, sweats or chills. No weight loss.  CV: Denies chest pain, palpitations.  Resp: See HPI.  GI: See HPI. GU : Denies urinary burning, blood in urine, increased urinary frequency or incontinence. MS: Denies joint pain, muscles aches or weakness. Derm: Denies rash, itchiness, skin lesions or unhealing ulcers. Psych: Denies depression, anxiety, memory loss, suicidal ideation and confusion. Heme: Denies bruising, bleeding. Neuro:  Denies headaches, dizziness or paresthesias. Endo:  + DM II, adrenal insufficiency secondary to chronic Prednisone use for Sarcoidosis.     PHYSICAL EXAM: BP (!) 128/54   Pulse 62   Ht 5\' 10"  (1.778 m)   Wt 231 lb 8 oz (105 kg)   BMI 33.22 kg/m   General: Well developed  74 year old male in no acute distress. Head: Normocephalic and atraumatic. Eyes:  Sclerae non-icteric, conjunctive pink. Ears: Normal auditory acuity. Mouth: Upper dentures. No ulcers or lesions.  Neck: Supple, no lymphadenopathy or thyromegaly.  Lungs: Clear bilaterally to auscultation without wheezes, crackles or rhonchi. Heart: Regular rate and rhythm. No murmur, rub or gallop appreciated.  Abdomen: Soft, nontender, non distended. No masses. No hepatosplenomegaly. Normoactive bowel sounds x 4 quadrants. Large RLQ scar. Umbilical hernia.  Rectal: Deferred.  Musculoskeletal: Symmetrical with no gross deformities. Skin: Warm and dry. No rash or lesions on visible extremities. Extremities: No edema. Neurological: Alert oriented x 4, no focal deficits.  Psychological:  Alert and cooperative. Normal mood and affect.  ASSESSMENT AND PLAN:  88.  74 year old male with persistent iron deficiency anemia. + FOBT.   EGD 07/2016 was normal and a colonoscopy identified a 15 mm inflammatory polyp, no etiology of his IDA was identified. Hg 9.3 on 11/13/2019  (Hg 10.2 on 10/07/2018). Retic ct 2.1. EPO 66. Patient denies GERD sx, abdominal  pain or obvious signs of GI bleeding. -CBC -Proceed with iron infusions per hematology -EGD and colonoscopy benefits and risks discussed including risk with sedation, risk of bleeding, perforation and infection.  Dr. Carlean Purl to verify if he agrees with repeat EGD and colonoscopy.  -Discussed scheduling a small bowel capsule endoscopy if EGD and colonoscopy negative -Continue Pantoprazole 40mg  QD  2.  Coronary artery disease with a history of 5 coronary stents, the last 2 stents were placed 08/2018.  He remains on Plavix and aspirin.  3. OSA uses Cpap  4. Sarcoid on Chronic Prednisone with adrenal insufficiency   5. CKD          CC:  Shirline Frees, MD

## 2019-11-29 ENCOUNTER — Ambulatory Visit: Payer: Medicare HMO | Admitting: Physician Assistant

## 2019-11-29 ENCOUNTER — Inpatient Hospital Stay: Payer: Medicare HMO

## 2019-11-29 NOTE — Telephone Encounter (Signed)
OK to hold plavix for 5 days prior to endoscopy Last stent 08/2018 Stable disease  Candee Furbish, MD

## 2019-11-29 NOTE — Telephone Encounter (Signed)
Primary Cardiologist: Candee Furbish, MD  Chart reviewed as part of pre-operative protocol coverage. Patient was contacted 11/29/2019 in reference to pre-operative risk assessment for pending surgery as outlined below.  [name] was last seen on 10/31/2019 by Craig Dopp, PA.  Since that day, [name] has been stable. Upon review of note by [name], it is acceptable for him to hold Plavix for 5 days prior to endoscopy, but will start back ASAP after procedure.   Therefore, based on ACC/AHA guidelines, the patient would be at acceptable risk for the planned procedure without further cardiovascular testing.   I will route this recommendation to the requesting party via Epic fax function and remove from pre-op pool.  Please call with questions.  Craig Sims, NP 11/29/2019, 8:57 AM

## 2019-11-30 DIAGNOSIS — S2241XA Multiple fractures of ribs, right side, initial encounter for closed fracture: Secondary | ICD-10-CM | POA: Diagnosis not present

## 2019-12-03 ENCOUNTER — Inpatient Hospital Stay: Payer: Medicare HMO

## 2019-12-03 DIAGNOSIS — X32XXXD Exposure to sunlight, subsequent encounter: Secondary | ICD-10-CM | POA: Diagnosis not present

## 2019-12-03 DIAGNOSIS — L570 Actinic keratosis: Secondary | ICD-10-CM | POA: Diagnosis not present

## 2019-12-03 DIAGNOSIS — L820 Inflamed seborrheic keratosis: Secondary | ICD-10-CM | POA: Diagnosis not present

## 2019-12-04 ENCOUNTER — Encounter (INDEPENDENT_AMBULATORY_CARE_PROVIDER_SITE_OTHER): Payer: Medicare HMO | Admitting: Ophthalmology

## 2019-12-04 ENCOUNTER — Other Ambulatory Visit: Payer: Self-pay

## 2019-12-04 DIAGNOSIS — D649 Anemia, unspecified: Secondary | ICD-10-CM

## 2019-12-04 MED ORDER — NA SULFATE-K SULFATE-MG SULF 17.5-3.13-1.6 GM/177ML PO SOLN: 2.0000 | Freq: Once | ORAL | 0 refills | Status: AC

## 2019-12-04 MED ORDER — POLYETHYLENE GLYCOL 3350 17 GM/SCOOP PO POWD: ORAL | 3 refills | Status: DC

## 2019-12-06 ENCOUNTER — Telehealth: Payer: Self-pay

## 2019-12-06 ENCOUNTER — Encounter: Payer: Self-pay | Admitting: General Surgery

## 2019-12-06 ENCOUNTER — Inpatient Hospital Stay: Payer: Medicare HMO

## 2019-12-06 NOTE — Telephone Encounter (Signed)
I contacted the patient regarding recommendation that he take Miralax the day before the procedure perhaps 4 doses. The patient reminded me that the day he was in he emphasized he could not take miralax because if gives him nausea and vomitng. That is the reason his 2 day prep was given with just ducolax. I should have placed this in his chart.  Patient instructed to continue with directions given unless we call him back. Patient verbalized understanding.

## 2019-12-06 NOTE — Telephone Encounter (Signed)
-----   Message from Craig Copas., [name] sent at 12/06/2019  1:42 AM EDT ----- CG,Thanks for the input.Farron Lafond please see Dr. Celesta Aver recommendations below and ensure we have all of these things set up.Thanks.GM ----- Message ----- From: Craig Mayer, [name] Sent: 12/05/2019   9:09 AM EDT To: Craig Copas., [name], #  Gabe  Thanks for doing the procedures.  Note that I had recommended that we have an extra-large abdominal binder available to wrap on him to facilitate colonoscopy insertion in the future.  Looks like you are doing his procedure on November 4 and it would be something we want to make sure the [facility] has available for him.  I had also recommended a 2-day prep and it looks like he has been given some extra Dulcolax 2 days before.  Hopefully that will be enough with the Suprep.  I would consider adding some MiraLAX on the day before prep day perhaps 4 doses with that Dulcolax  Thanks again  Glendell Docker  ----- Message ----- From: Craig Copas., [name] Sent: 11/29/2019   8:22 AM EDT To: Craig Mayer, [name], #  Got it. Thanks. If unremarkable, then VCE likely next step in setting of persistent IDA + Heme positive stool. GM ----- Message ----- From: Craig Pick, [name] Sent: 11/28/2019   1:14 PM EDT To: Craig Mayer, [name], Craig Copas., [name]  [name], patient scheduled EGD and colonoscopy with you as you are supervising physician today and he was informed Dr. Carlean Purl is not doing procedures at this time.

## 2019-12-06 NOTE — Telephone Encounter (Signed)
Left a voicemail for the [facility] nurse manager, Raelyn Ensign. Per recommendation of Dr Carlean Purl we need to have an extra large abdominal binder available for the patients colonoscopy.

## 2019-12-06 NOTE — Telephone Encounter (Signed)
Note that I had recommended that we have an extra-large abdominal binder available to wrap on him to facilitate colonoscopy insertion in the future.   Looks like you are doing his procedure on November 4 and it would be something we want to make sure the [facility] has available for him.   I had also recommended a 2-day prep and it looks like he has been given some extra Dulcolax 2 days before. Hopefully that will be enough with the Suprep. I would consider adding some MiraLAX on the day before prep day perhaps 4 doses with that Dulcolax   Thanks again   Glendell Docker

## 2019-12-10 ENCOUNTER — Ambulatory Visit: Payer: Medicare HMO

## 2019-12-18 ENCOUNTER — Other Ambulatory Visit: Payer: Medicare HMO

## 2019-12-18 ENCOUNTER — Encounter (INDEPENDENT_AMBULATORY_CARE_PROVIDER_SITE_OTHER): Payer: Medicare HMO | Admitting: Ophthalmology

## 2019-12-18 ENCOUNTER — Ambulatory Visit: Payer: Medicare HMO | Admitting: Family

## 2019-12-24 ENCOUNTER — Ambulatory Visit: Payer: Self-pay

## 2019-12-24 ENCOUNTER — Encounter: Payer: Self-pay | Admitting: Physician Assistant

## 2019-12-24 ENCOUNTER — Ambulatory Visit: Payer: Medicare HMO | Admitting: Physician Assistant

## 2019-12-24 DIAGNOSIS — M14672 Charcot's joint, left ankle and foot: Secondary | ICD-10-CM | POA: Diagnosis not present

## 2019-12-24 MED ORDER — SULFAMETHOXAZOLE-TRIMETHOPRIM 800-160 MG PO TABS: 1.0000 | ORAL_TABLET | Freq: Two times a day (BID) | ORAL | 0 refills | Status: DC

## 2019-12-24 NOTE — Progress Notes (Signed)
Office Visit Note   Patient: [name]           Date of Birth: [birth year]           MRN: [id number] Visit Date: 12/24/2019              Requested by: Shirline Frees, MD [place],   [id number] PCP: Shirline Frees, MD  Chief Complaint  Patient presents with  . Left Foot - Open Wound      HPI: Patient presents today for follow-up on his left foot.  He has a history of Charcot arthropathy.  He was using a boot and then switched to orthotics.  The orthotics did not have pressure relief between the bottom of his rocker deformity.  He has developed 2 blisters that opened up over the course of the last 2 weeks.  This was at first quite painful.  His wife has been treating this with topical antibiotics and soaks.  It does feel slightly better and he can put weight on it.  He denies any fever or chills.  She comes in today because he has had some clear serous drainage  Assessment & Plan: Visit Diagnoses:  1. Charcot's joint, left ankle and foot     Plan: I will start him on a course of Bactrim as doxycycline causes him quite a bit of GI upset is very clear with he and his wife that he he has increasing pain redness purulence malaise or fever he is to contact us immediately or go to emergency room.  They will follow-up in 1 week.  He should offload this foot and keep it covered.  Cleanse daily with soap and water antibacterial.  Follow-Up Instructions: No follow-ups on file.   Ortho Exam  Patient is alert, oriented, no adenopathy, well-dressed, normal affect, normal respiratory effort. Focused examination of his left foot.  No cellulitis no erythema.  Mild to moderate soft tissue swelling.  He does have a 1 cm area on the bottom of his foot.  I cannot appreciate any fluctuance this probes into the fascia.  Does not probe to bone.  I cannot express any purulence and just a small amount of serous drainage.  Patient tolerates this well.  No ascending  cellulitis  Imaging: No results found. No images are attached to the encounter.  Labs: Lab Results  Component Value Date   HGBA1C 10.2 (H) 10/04/2018   HGBA1C (H) 08/15/2008    12.2 (NOTE) The ADA recommends the following therapeutic goal for glycemic control related to Hgb A1c measurement: Goal of therapy: <6.5 Hgb A1c  Reference: American Diabetes Association: Clinical Practice Recommendations 2010, Diabetes Care, 2010, 33: (Suppl  1).   HGBA1C (H) 11/27/2007    9.5 (NOTE)   The ADA recommends the following therapeutic goal for glycemic   control related to Hgb A1C measurement:   Goal of Therapy:   < 7.0% Hgb A1C   Reference: American Diabetes Association: Clinical Practice   Recommendations 2008, Diabetes Care,  2008, 31:(Suppl 1).   LABURIC 7.2 10/30/2018   REPTSTATUS 11/10/2010 FINAL 11/09/2010   CULT NO GROWTH 11/09/2010     Lab Results  Component Value Date   ALBUMIN 3.5 11/13/2019   ALBUMIN 2.9 (L) 10/07/2018   ALBUMIN 2.9 (L) 10/04/2018   PREALBUMIN 21.9 10/04/2018   LABURIC 7.2 10/30/2018    Lab Results  Component Value Date   MG 1.7 10/06/2018   MG 1.6 (L) 10/04/2018  MG 1.8 10/03/2018   No results found for: VD25OH  Lab Results  Component Value Date   PREALBUMIN 21.9 10/04/2018   CBC EXTENDED Latest Ref Rng & Units 11/28/2019 11/13/2019 11/13/2019  WBC 4.0 - 10.5 K/uL 13.4(H) 10.1 -  RBC 4.22 - 5.81 Mil/uL 3.60(L) 3.47(L) 3.46(L)  HGB 13.0 - 17.0 g/dL 9.8(L) 9.3(L) -  HCT 39 - 52 % 30.5(L) 30.9(L) -  PLT 150 - 400 K/uL 469.0(H) 314 -  NEUTROABS 1.7 - 7.7 K/uL - 7.4 -  LYMPHSABS 0.7 - 4.0 K/uL - 1.5 -     There is no height or weight on file to calculate BMI.  Orders:  Orders Placed This Encounter  Procedures  . XR Foot 2 Views Left  . XR Ankle 2 Views Left   No orders of the defined types were placed in this encounter.    Procedures: No procedures performed  Clinical Data: No additional findings.  ROS:  All other systems negative,  except as noted in the HPI. Review of Systems  Objective: Vital Signs: There were no vitals taken for this visit.  Specialty Comments:  No specialty comments available.  PMFS History: Patient Active Problem List   Diagnosis Date Noted  . Charcot's joint, left ankle and foot 11/06/2019  . Peripheral arterial disease ([place]) 07/12/2019  . Pain in right elbow 10/30/2018  . Acute kidney injury superimposed on chronic kidney disease ([place]) 10/04/2018  . AKI (acute kidney injury) ([place]) 10/03/2018  . Hypoalbuminemia 10/03/2018  . Hypokalemia 10/03/2018  . Hypothyroidism 10/03/2018  . CKD (chronic kidney disease), stage III ([place])   . Iron deficiency anemia   . Symptomatic anemia 06/29/2016  . Chronic diastolic heart failure ([place]) 01/14/2014  . Stable angina ([place]) 01/14/2014  . Obesity 01/14/2014  . Pulmonary sarcoidosis ([place]) 01/14/2014  . Uncontrolled diabetes mellitus ([place]) 01/14/2014  . Hyperlipidemia 01/14/2014  . Coronary atherosclerosis of native coronary artery   . Sinusitis, chronic 05/30/2011  . Sleep apnea 10/09/2008  . ADRENAL INSUFFICIENCY, HX OF 02/21/2008  . Coronary artery disease 11/20/2007  . Sarcoidosis 02/20/2007  . Diabetes mellitus without complication ([place]) 16/38/4665  . GERD 12/20/2006   Past Medical History:  Diagnosis Date  . Adrenal insufficiency ([place])   . Allergy   . Anemia   . Arthritis    feet   . Broken foot 09/2019   had to wore a boot. Left   . CKD (chronic kidney disease), stage III   . Coronary artery disease    has stents  . Coronary atherosclerosis of native coronary artery    Proximal LAD, posterior lateral stent widely patent-10/12/11  . Diabetes mellitus    insulin and pills  . Hearing loss    wears hearing aids  . Heart attack ([place]) 2010  . History of blood transfusion 06/30/2016   [facility] - 2 units transfused  . Hypertension   . OSA (obstructive sleep apnea)    uses VPAC sleep study 2 years done through [facility]. Dr. Marlou Porch  arranged study  . Pneumonia    3-4 years ago  . Sarcoid   . Sarcoidosis   . Sleep apnea    uses CPAP nightly  . Thyroid disease   . Type 2 diabetes mellitus ([facility])     Family History  Problem Relation Age of Onset  . Kidney disease Mother   . Diabetes Mother   . Kidney cancer Mother   . Heart attack Father   . Asthma Sister   . Anesthesia  problems Sister        "Kidney's did not wake up"  . Sarcoidosis Sister   . Sarcoidosis Niece   . Colon cancer Neg Hx   . Colon polyps Neg Hx   . Rectal cancer Neg Hx   . Stomach cancer Neg Hx     Past Surgical History:  Procedure Laterality Date  . APPENDECTOMY    . CATARACT EXTRACTION  2011   bilat  . CHOLECYSTECTOMY  01/25/2011   Procedure: LAPAROSCOPIC CHOLECYSTECTOMY WITH INTRAOPERATIVE CHOLANGIOGRAM;  Surgeon: Judieth Keens, DO;  Location: [facility];  Service: General;  Laterality: N/A;  . COLONOSCOPY     several  . CORONARY ANGIOPLASTY     most recent 11/2009  . CORONARY STENT INTERVENTION N/A 08/07/2018   Procedure: CORONARY STENT INTERVENTION;  Surgeon: Jettie Booze, MD;  Location: [facility];  Service: Cardiovascular;  Laterality: N/A;  . CORONARY STENT PLACEMENT  2009   in LAD and side branch PTCA  . INTERCOSTAL NERVE BLOCK  2011, 06/2016   x2. lumbar spine  . LEFT HEART CATHETERIZATION WITH CORONARY ANGIOGRAM Bilateral 10/12/2011   Procedure: LEFT HEART CATHETERIZATION WITH CORONARY ANGIOGRAM;  Surgeon: Candee Furbish, MD;  Location: [facility] C[facility] LAB;  Service: Cardiovascular;  Laterality: Bilateral;  . LEFT HEART CATHETERIZATION WITH CORONARY ANGIOGRAM N/A 04/01/2014   Procedure: LEFT HEART CATHETERIZATION WITH CORONARY ANGIOGRAM;  Surgeon: Candee Furbish, MD;  Location: [facility] CATH LAB;  Service: Cardiovascular;  Laterality: N/A;  . RIGHT/LEFT HEART CATH AND CORONARY ANGIOGRAPHY N/A 08/07/2018   Procedure: RIGHT/LEFT HEART CATH AND CORONARY ANGIOGRAPHY;  Surgeon: Jettie Booze, MD;  Location: [facility];  Service:  Cardiovascular;  Laterality: N/A;   Social History   Occupational History  . 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 Smoking status: Never Smoker  . Smokeless tobacco: Never Used  Vaping Use  . Vaping Use: Never used  Substance and Sexual Activity  . Alcohol use: No  . Drug use: No  . Sexual activity: Not on file

## 2019-12-25 ENCOUNTER — Encounter (INDEPENDENT_AMBULATORY_CARE_PROVIDER_SITE_OTHER): Payer: Medicare HMO | Admitting: Ophthalmology

## 2019-12-25 ENCOUNTER — Other Ambulatory Visit: Payer: Self-pay

## 2019-12-25 DIAGNOSIS — E113311 Type 2 diabetes mellitus with moderate nonproliferative diabetic retinopathy with macular edema, right eye: Secondary | ICD-10-CM

## 2019-12-25 DIAGNOSIS — E11311 Type 2 diabetes mellitus with unspecified diabetic retinopathy with macular edema: Secondary | ICD-10-CM

## 2019-12-25 DIAGNOSIS — H35033 Hypertensive retinopathy, bilateral: Secondary | ICD-10-CM | POA: Diagnosis not present

## 2019-12-25 DIAGNOSIS — E113512 Type 2 diabetes mellitus with proliferative diabetic retinopathy with macular edema, left eye: Secondary | ICD-10-CM | POA: Diagnosis not present

## 2019-12-25 DIAGNOSIS — H43813 Vitreous degeneration, bilateral: Secondary | ICD-10-CM | POA: Diagnosis not present

## 2019-12-25 DIAGNOSIS — I10 Essential (primary) hypertension: Secondary | ICD-10-CM | POA: Diagnosis not present

## 2019-12-30 ENCOUNTER — Other Ambulatory Visit: Payer: Self-pay | Admitting: Internal Medicine

## 2019-12-30 ENCOUNTER — Telehealth: Payer: Self-pay

## 2019-12-30 ENCOUNTER — Other Ambulatory Visit (INDEPENDENT_AMBULATORY_CARE_PROVIDER_SITE_OTHER): Payer: Medicare HMO

## 2019-12-30 DIAGNOSIS — D649 Anemia, unspecified: Secondary | ICD-10-CM | POA: Diagnosis not present

## 2019-12-30 DIAGNOSIS — D509 Iron deficiency anemia, unspecified: Secondary | ICD-10-CM

## 2019-12-30 LAB — CBC WITH DIFFERENTIAL/PLATELET
Basophils Absolute: 0 10*3/uL (ref 0.0–0.1)
Basophils Relative: 0.2 % (ref 0.0–3.0)
Eosinophils Absolute: 0.2 10*3/uL (ref 0.0–0.7)
Eosinophils Relative: 1.7 % (ref 0.0–5.0)
HCT: 24.2 % — ABNORMAL LOW (ref 39.0–52.0)
Hemoglobin: 7.5 g/dL — CL (ref 13.0–17.0)
Lymphocytes Relative: 14.4 % (ref 12.0–46.0)
Lymphs Abs: 1.9 10*3/uL (ref 0.7–4.0)
MCHC: 31 g/dL (ref 30.0–36.0)
MCV: 81.5 fl (ref 78.0–100.0)
Monocytes Absolute: 1 10*3/uL (ref 0.1–1.0)
Monocytes Relative: 7.5 % (ref 3.0–12.0)
Neutro Abs: 10 10*3/uL — ABNORMAL HIGH (ref 1.4–7.7)
Neutrophils Relative %: 76.2 % (ref 43.0–77.0)
Platelets: 457 10*3/uL — ABNORMAL HIGH (ref 150.0–400.0)
RBC: 2.97 Mil/uL — ABNORMAL LOW (ref 4.22–5.81)
RDW: 18.5 % — ABNORMAL HIGH (ref 11.5–15.5)
WBC: 13.1 10*3/uL — ABNORMAL HIGH (ref 4.0–10.5)

## 2019-12-30 LAB — IBC PANEL
Iron: 17 ug/dL — ABNORMAL LOW (ref 42–165)
Saturation Ratios: 4.9 % — ABNORMAL LOW (ref 20.0–50.0)
Transferrin: 246 mg/dL (ref 212.0–360.0)

## 2019-12-30 NOTE — Telephone Encounter (Signed)
Spoke with pt and he states his stools are dark and he does have some shortness of breath.

## 2019-12-30 NOTE — Telephone Encounter (Signed)
I called the patient and his stools are dark brown not melenic.  He is having some shortness of breath.  He says he is on iron pills.  I told him not to take his Plavix in the morning and he is going to come by for another CBC so we get 2 data points.  He also has an orthopedics appointment tomorrow for a Charcot joint and infection issue.  He has not had iron infusions due to questions about facility fee cost that have not yet been answered.  He may need to go ahead and get at least one iron infusion.  He was under the impression then needed 5 of those which could be quite expensive from a facility fee standpoint.  I will reach out to hematology.  We will regroup after I see the second CBC.

## 2019-12-30 NOTE — Progress Notes (Signed)
cbc

## 2019-12-30 NOTE — Telephone Encounter (Signed)
Received call from Lab re: critical lab results. Provided the following information:  Hgb 7.5 Hct 24.2  Routing this message per protocol to RN triage for further review.

## 2019-12-30 NOTE — Telephone Encounter (Signed)
I will defer final decision to Dr. Carlean Purl as to whether the patient will end up needing an outpatient procedure versus an inpatient procedure. GM

## 2019-12-30 NOTE — Telephone Encounter (Signed)
Pt is scheduled for ECL with Dr. Jannifer Rodney on 01/09/20. Pt had labs today and had critical Hgb. Please advise.

## 2019-12-30 NOTE — Telephone Encounter (Signed)
[name],  Please ask if he is seeing any signs of bleeding and we will get back   Anemia is worse but chronic

## 2019-12-31 ENCOUNTER — Ambulatory Visit: Payer: Medicare HMO | Admitting: Cardiology

## 2019-12-31 ENCOUNTER — Ambulatory Visit: Payer: Medicare HMO | Admitting: Orthopedic Surgery

## 2019-12-31 ENCOUNTER — Other Ambulatory Visit (INDEPENDENT_AMBULATORY_CARE_PROVIDER_SITE_OTHER): Payer: Medicare HMO

## 2019-12-31 ENCOUNTER — Other Ambulatory Visit: Payer: Self-pay | Admitting: Family

## 2019-12-31 ENCOUNTER — Telehealth: Payer: Self-pay | Admitting: *Deleted

## 2019-12-31 ENCOUNTER — Encounter: Payer: Self-pay | Admitting: Orthopedic Surgery

## 2019-12-31 DIAGNOSIS — M14672 Charcot's joint, left ankle and foot: Secondary | ICD-10-CM

## 2019-12-31 DIAGNOSIS — M86272 Subacute osteomyelitis, left ankle and foot: Secondary | ICD-10-CM

## 2019-12-31 DIAGNOSIS — D509 Iron deficiency anemia, unspecified: Secondary | ICD-10-CM

## 2019-12-31 LAB — IRON,TIBC AND FERRITIN PANEL
%SAT: 6 % (calc) — ABNORMAL LOW (ref 20–48)
Ferritin: 16 ng/mL — ABNORMAL LOW (ref 24–380)
Iron: 18 ug/dL — ABNORMAL LOW (ref 50–180)
TIBC: 326 mcg/dL (calc) (ref 250–425)

## 2019-12-31 LAB — CBC
HCT: 22.2 % — CL (ref 39.0–52.0)
Hemoglobin: 7 g/dL — CL (ref 13.0–17.0)
MCHC: 31.6 g/dL (ref 30.0–36.0)
MCV: 81.5 fl (ref 78.0–100.0)
Platelets: 456 10*3/uL — ABNORMAL HIGH (ref 150.0–400.0)
RBC: 2.72 Mil/uL — ABNORMAL LOW (ref 4.22–5.81)
RDW: 18.9 % — ABNORMAL HIGH (ref 11.5–15.5)
WBC: 11.3 10*3/uL — ABNORMAL HIGH (ref 4.0–10.5)

## 2019-12-31 NOTE — Telephone Encounter (Signed)
Dr Carlean Purl I received a call from the lab on this patient  hgb 7.0, looks like lower than yesterday

## 2019-12-31 NOTE — Telephone Encounter (Signed)
Hgb now 7.0 was 7.5 yesterday  He is sob but no cp and limiting activity  I think we have lost ability to handle as outpatient and he would benefit from blood and likely inpatient evalution for blood loss.  He will go to ED by tomorrow AM

## 2020-01-01 ENCOUNTER — Encounter (HOSPITAL_COMMUNITY): Payer: Self-pay

## 2020-01-01 ENCOUNTER — Telehealth: Payer: Self-pay | Admitting: *Deleted

## 2020-01-01 ENCOUNTER — Inpatient Hospital Stay (HOSPITAL_COMMUNITY)
Admission: EM | Admit: 2020-01-01 | Discharge: 2020-01-03 | DRG: 812 | Disposition: A | Discharge status: Home / Self Care | Payer: Medicare HMO | Attending: Internal Medicine | Admitting: Internal Medicine

## 2020-01-01 ENCOUNTER — Other Ambulatory Visit: Payer: Self-pay

## 2020-01-01 ENCOUNTER — Encounter: Payer: Self-pay | Admitting: Orthopedic Surgery

## 2020-01-01 ENCOUNTER — Encounter (INDEPENDENT_AMBULATORY_CARE_PROVIDER_SITE_OTHER): Payer: Medicare HMO | Admitting: Ophthalmology

## 2020-01-01 DIAGNOSIS — Z7982 Long term (current) use of aspirin: Secondary | ICD-10-CM

## 2020-01-01 DIAGNOSIS — Z23 Encounter for immunization: Secondary | ICD-10-CM

## 2020-01-01 DIAGNOSIS — Z955 Presence of coronary angioplasty implant and graft: Secondary | ICD-10-CM | POA: Diagnosis not present

## 2020-01-01 DIAGNOSIS — N183 Chronic kidney disease, stage 3 unspecified: Secondary | ICD-10-CM | POA: Diagnosis present

## 2020-01-01 DIAGNOSIS — Z79890 Hormone replacement therapy (postmenopausal): Secondary | ICD-10-CM

## 2020-01-01 DIAGNOSIS — Z8249 Family history of ischemic heart disease and other diseases of the circulatory system: Secondary | ICD-10-CM

## 2020-01-01 DIAGNOSIS — D649 Anemia, unspecified: Secondary | ICD-10-CM | POA: Diagnosis not present

## 2020-01-01 DIAGNOSIS — K219 Gastro-esophageal reflux disease without esophagitis: Secondary | ICD-10-CM | POA: Diagnosis present

## 2020-01-01 DIAGNOSIS — N1832 Chronic kidney disease, stage 3b: Secondary | ICD-10-CM | POA: Diagnosis not present

## 2020-01-01 DIAGNOSIS — M86272 Subacute osteomyelitis, left ankle and foot: Secondary | ICD-10-CM | POA: Diagnosis not present

## 2020-01-01 DIAGNOSIS — E039 Hypothyroidism, unspecified: Secondary | ICD-10-CM | POA: Diagnosis present

## 2020-01-01 DIAGNOSIS — D62 Acute posthemorrhagic anemia: Secondary | ICD-10-CM | POA: Diagnosis not present

## 2020-01-01 DIAGNOSIS — Z841 Family history of disorders of kidney and ureter: Secondary | ICD-10-CM | POA: Diagnosis not present

## 2020-01-01 DIAGNOSIS — H9190 Unspecified hearing loss, unspecified ear: Secondary | ICD-10-CM | POA: Diagnosis present

## 2020-01-01 DIAGNOSIS — N1831 Chronic kidney disease, stage 3a: Secondary | ICD-10-CM | POA: Diagnosis present

## 2020-01-01 DIAGNOSIS — Z833 Family history of diabetes mellitus: Secondary | ICD-10-CM | POA: Diagnosis not present

## 2020-01-01 DIAGNOSIS — K317 Polyp of stomach and duodenum: Secondary | ICD-10-CM

## 2020-01-01 DIAGNOSIS — E119 Type 2 diabetes mellitus without complications: Secondary | ICD-10-CM | POA: Diagnosis not present

## 2020-01-01 DIAGNOSIS — D509 Iron deficiency anemia, unspecified: Secondary | ICD-10-CM

## 2020-01-01 DIAGNOSIS — Z20822 Contact with and (suspected) exposure to covid-19: Secondary | ICD-10-CM | POA: Diagnosis not present

## 2020-01-01 DIAGNOSIS — I25119 Atherosclerotic heart disease of native coronary artery with unspecified angina pectoris: Secondary | ICD-10-CM | POA: Diagnosis not present

## 2020-01-01 DIAGNOSIS — Z794 Long term (current) use of insulin: Secondary | ICD-10-CM

## 2020-01-01 DIAGNOSIS — M869 Osteomyelitis, unspecified: Secondary | ICD-10-CM

## 2020-01-01 DIAGNOSIS — E785 Hyperlipidemia, unspecified: Secondary | ICD-10-CM | POA: Diagnosis present

## 2020-01-01 DIAGNOSIS — K922 Gastrointestinal hemorrhage, unspecified: Secondary | ICD-10-CM | POA: Diagnosis present

## 2020-01-01 DIAGNOSIS — Z8719 Personal history of other diseases of the digestive system: Secondary | ICD-10-CM

## 2020-01-01 DIAGNOSIS — D860 Sarcoidosis of lung: Secondary | ICD-10-CM | POA: Diagnosis present

## 2020-01-01 DIAGNOSIS — G4733 Obstructive sleep apnea (adult) (pediatric): Secondary | ICD-10-CM | POA: Diagnosis present

## 2020-01-01 DIAGNOSIS — E1122 Type 2 diabetes mellitus with diabetic chronic kidney disease: Secondary | ICD-10-CM | POA: Diagnosis present

## 2020-01-01 DIAGNOSIS — Z888 Allergy status to other drugs, medicaments and biological substances status: Secondary | ICD-10-CM

## 2020-01-01 DIAGNOSIS — E1169 Type 2 diabetes mellitus with other specified complication: Secondary | ICD-10-CM | POA: Diagnosis present

## 2020-01-01 DIAGNOSIS — Z974 Presence of external hearing-aid: Secondary | ICD-10-CM

## 2020-01-01 DIAGNOSIS — Z7952 Long term (current) use of systemic steroids: Secondary | ICD-10-CM

## 2020-01-01 DIAGNOSIS — Z8051 Family history of malignant neoplasm of kidney: Secondary | ICD-10-CM | POA: Diagnosis not present

## 2020-01-01 DIAGNOSIS — I5032 Chronic diastolic (congestive) heart failure: Secondary | ICD-10-CM | POA: Diagnosis not present

## 2020-01-01 DIAGNOSIS — I2510 Atherosclerotic heart disease of native coronary artery without angina pectoris: Secondary | ICD-10-CM | POA: Diagnosis not present

## 2020-01-01 DIAGNOSIS — I129 Hypertensive chronic kidney disease with stage 1 through stage 4 chronic kidney disease, or unspecified chronic kidney disease: Secondary | ICD-10-CM | POA: Diagnosis not present

## 2020-01-01 DIAGNOSIS — I25118 Atherosclerotic heart disease of native coronary artery with other forms of angina pectoris: Secondary | ICD-10-CM | POA: Diagnosis not present

## 2020-01-01 DIAGNOSIS — I130 Hypertensive heart and chronic kidney disease with heart failure and stage 1 through stage 4 chronic kidney disease, or unspecified chronic kidney disease: Secondary | ICD-10-CM | POA: Diagnosis present

## 2020-01-01 DIAGNOSIS — Z79899 Other long term (current) drug therapy: Secondary | ICD-10-CM

## 2020-01-01 DIAGNOSIS — Z7902 Long term (current) use of antithrombotics/antiplatelets: Secondary | ICD-10-CM

## 2020-01-01 DIAGNOSIS — Z825 Family history of asthma and other chronic lower respiratory diseases: Secondary | ICD-10-CM

## 2020-01-01 LAB — CBC WITH DIFFERENTIAL/PLATELET
Abs Immature Granulocytes: 0.3 10*3/uL — ABNORMAL HIGH (ref 0.00–0.07)
Basophils Absolute: 0.1 10*3/uL (ref 0.0–0.1)
Basophils Relative: 1 %
Eosinophils Absolute: 0.2 10*3/uL (ref 0.0–0.5)
Eosinophils Relative: 1 %
HCT: 24.6 % — ABNORMAL LOW (ref 39.0–52.0)
Hemoglobin: 7.1 g/dL — ABNORMAL LOW (ref 13.0–17.0)
Immature Granulocytes: 2 %
Lymphocytes Relative: 18 %
Lymphs Abs: 2.3 10*3/uL (ref 0.7–4.0)
MCH: 25.6 pg — ABNORMAL LOW (ref 26.0–34.0)
MCHC: 28.9 g/dL — ABNORMAL LOW (ref 30.0–36.0)
MCV: 88.8 fL (ref 80.0–100.0)
Monocytes Absolute: 1.1 10*3/uL — ABNORMAL HIGH (ref 0.1–1.0)
Monocytes Relative: 8 %
Neutro Abs: 9.4 10*3/uL — ABNORMAL HIGH (ref 1.7–7.7)
Neutrophils Relative %: 70 %
Platelets: 459 10*3/uL — ABNORMAL HIGH (ref 150–400)
RBC: 2.77 MIL/uL — ABNORMAL LOW (ref 4.22–5.81)
RDW: 17.2 % — ABNORMAL HIGH (ref 11.5–15.5)
WBC: 13.3 10*3/uL — ABNORMAL HIGH (ref 4.0–10.5)
nRBC: 0.2 % (ref 0.0–0.2)

## 2020-01-01 LAB — COMPREHENSIVE METABOLIC PANEL
ALT: 36 U/L (ref 0–44)
AST: 22 U/L (ref 15–41)
Albumin: 3.2 g/dL — ABNORMAL LOW (ref 3.5–5.0)
Alkaline Phosphatase: 65 U/L (ref 38–126)
Anion gap: 11 (ref 5–15)
BUN: 29 mg/dL — ABNORMAL HIGH (ref 8–23)
CO2: 28 mmol/L (ref 22–32)
Calcium: 8.7 mg/dL — ABNORMAL LOW (ref 8.9–10.3)
Chloride: 103 mmol/L (ref 98–111)
Creatinine, Ser: 1.75 mg/dL — ABNORMAL HIGH (ref 0.61–1.24)
GFR, Estimated: 40 mL/min — ABNORMAL LOW (ref >60)
Glucose, Bld: 134 mg/dL — ABNORMAL HIGH (ref 70–99)
Potassium: 3.6 mmol/L (ref 3.5–5.1)
Sodium: 142 mmol/L (ref 135–145)
Total Bilirubin: 0.5 mg/dL (ref 0.3–1.2)
Total Protein: 6.9 g/dL (ref 6.5–8.1)

## 2020-01-01 LAB — RESPIRATORY PANEL BY RT PCR (FLU A&B, COVID)
Influenza A by PCR: NEGATIVE
Influenza B by PCR: NEGATIVE
SARS Coronavirus 2 by RT PCR: NEGATIVE

## 2020-01-01 LAB — GLUCOSE, CAPILLARY
Glucose-Capillary: 66 mg/dL — ABNORMAL LOW (ref 70–99)
Glucose-Capillary: 110 mg/dL — ABNORMAL HIGH (ref 70–99)

## 2020-01-01 LAB — PREPARE RBC (CROSSMATCH)

## 2020-01-01 LAB — HEMOGLOBIN AND HEMATOCRIT, BLOOD
HCT: 28.2 % — ABNORMAL LOW (ref 39.0–52.0)
Hemoglobin: 8.6 g/dL — ABNORMAL LOW (ref 13.0–17.0)

## 2020-01-01 MED ORDER — SODIUM CHLORIDE 0.9 % IV SOLN
10.0000 mL/h | Freq: Once | INTRAVENOUS | Status: AC
  Administered 2020-01-01: 10 mL/h via INTRAVENOUS

## 2020-01-01 MED ORDER — ALLOPURINOL 100 MG PO TABS
100.0000 mg | ORAL_TABLET | Freq: Two times a day (BID) | ORAL | Status: DC
  Administered 2020-01-01 – 2020-01-02 (×2): 100 mg via ORAL
  Filled 2020-01-01 (×4): qty 1

## 2020-01-01 MED ORDER — ACETAMINOPHEN 500 MG PO TABS: 500.0000 mg | ORAL_TABLET | Freq: Every day | ORAL | Status: DC | PRN

## 2020-01-01 MED ORDER — PANTOPRAZOLE SODIUM 40 MG PO TBEC
40.0000 mg | DELAYED_RELEASE_TABLET | Freq: Every day | ORAL | Status: DC
  Administered 2020-01-02 – 2020-01-03 (×2): 40 mg via ORAL
  Filled 2020-01-01 (×2): qty 1

## 2020-01-01 MED ORDER — SULFAMETHOXAZOLE-TRIMETHOPRIM 800-160 MG PO TABS
1.0000 | ORAL_TABLET | Freq: Two times a day (BID) | ORAL | Status: DC
  Administered 2020-01-01 – 2020-01-03 (×4): 1 via ORAL
  Filled 2020-01-01 (×4): qty 1

## 2020-01-01 MED ORDER — ALBUTEROL SULFATE HFA 108 (90 BASE) MCG/ACT IN AERS: 2.0000 | INHALATION_SPRAY | Freq: Four times a day (QID) | RESPIRATORY_TRACT | Status: DC | PRN

## 2020-01-01 MED ORDER — SODIUM CHLORIDE 0.9 % IV SOLN
25.0000 mg | Freq: Once | INTRAVENOUS | Status: AC
  Administered 2020-01-01: 25 mg via INTRAVENOUS
  Filled 2020-01-01: qty 0.5

## 2020-01-01 MED ORDER — FUROSEMIDE 40 MG PO TABS
80.0000 mg | ORAL_TABLET | Freq: Every day | ORAL | Status: DC
  Administered 2020-01-02 – 2020-01-03 (×2): 80 mg via ORAL
  Filled 2020-01-01 (×2): qty 2

## 2020-01-01 MED ORDER — BISACODYL 5 MG PO TBEC
20.0000 mg | DELAYED_RELEASE_TABLET | Freq: Once | ORAL | Status: AC
  Administered 2020-01-02: 20 mg via ORAL
  Filled 2020-01-01: qty 4

## 2020-01-01 MED ORDER — BRIMONIDINE TARTRATE 0.2 % OP SOLN
1.0000 [drp] | Freq: Every day | OPHTHALMIC | Status: DC
  Filled 2020-01-01: qty 5

## 2020-01-01 MED ORDER — LOSARTAN POTASSIUM 50 MG PO TABS
50.0000 mg | ORAL_TABLET | Freq: Every day | ORAL | Status: DC
  Administered 2020-01-02 – 2020-01-03 (×2): 50 mg via ORAL
  Filled 2020-01-01 (×2): qty 1

## 2020-01-01 MED ORDER — ROSUVASTATIN CALCIUM 10 MG PO TABS
10.0000 mg | ORAL_TABLET | Freq: Every day | ORAL | Status: DC
  Administered 2020-01-01 – 2020-01-02 (×2): 10 mg via ORAL
  Filled 2020-01-01 (×2): qty 1

## 2020-01-01 MED ORDER — MAGNESIUM CITRATE PO SOLN
0.5000 | Freq: Once | ORAL | Status: AC
  Administered 2020-01-02: 0.5 via ORAL
  Filled 2020-01-01: qty 296

## 2020-01-01 MED ORDER — MONTELUKAST SODIUM 10 MG PO TABS: 10.0000 mg | ORAL_TABLET | Freq: Every day | ORAL | Status: DC | PRN

## 2020-01-01 MED ORDER — METOCLOPRAMIDE HCL 5 MG/ML IJ SOLN
10.0000 mg | Freq: Once | INTRAMUSCULAR | Status: AC
  Administered 2020-01-02: 10 mg via INTRAVENOUS
  Filled 2020-01-01: qty 2

## 2020-01-01 MED ORDER — ASPIRIN EC 81 MG PO TBEC
81.0000 mg | DELAYED_RELEASE_TABLET | Freq: Every evening | ORAL | Status: DC
  Administered 2020-01-02: 81 mg via ORAL
  Filled 2020-01-01: qty 1

## 2020-01-01 MED ORDER — SODIUM CHLORIDE 0.9 % IV SOLN
500.0000 mg | Freq: Once | INTRAVENOUS | Status: AC
  Administered 2020-01-01: 500 mg via INTRAVENOUS
  Filled 2020-01-01: qty 10

## 2020-01-01 MED ORDER — INSULIN ASPART 100 UNIT/ML ~~LOC~~ SOLN
0.0000 [IU] | Freq: Three times a day (TID) | SUBCUTANEOUS | Status: DC
  Administered 2020-01-02: 5 [IU] via SUBCUTANEOUS
  Administered 2020-01-03: 1 [IU] via SUBCUTANEOUS

## 2020-01-01 MED ORDER — PREDNISONE 10 MG PO TABS
10.0000 mg | ORAL_TABLET | Freq: Every day | ORAL | Status: DC
  Administered 2020-01-02 – 2020-01-03 (×2): 10 mg via ORAL
  Filled 2020-01-01 (×2): qty 1

## 2020-01-01 MED ORDER — ISOSORBIDE MONONITRATE ER 30 MG PO TB24
30.0000 mg | ORAL_TABLET | Freq: Every day | ORAL | Status: DC
  Filled 2020-01-01 (×2): qty 1

## 2020-01-01 MED ORDER — METOPROLOL TARTRATE 5 MG/5ML IV SOLN: 5.0000 mg | Freq: Four times a day (QID) | INTRAVENOUS | Status: DC | PRN

## 2020-01-01 MED ORDER — ENSURE ENLIVE PO LIQD: 237.0000 mL | Freq: Two times a day (BID) | ORAL | Status: DC

## 2020-01-01 MED ORDER — INFLUENZA VAC A&B SA ADJ QUAD 0.5 ML IM PRSY
0.5000 mL | PREFILLED_SYRINGE | INTRAMUSCULAR | Status: DC
  Filled 2020-01-01: qty 0.5

## 2020-01-01 MED ORDER — LEVOTHYROXINE SODIUM 50 MCG PO TABS: 50.0000 ug | ORAL_TABLET | Freq: Every day | ORAL | Status: DC

## 2020-01-01 NOTE — Consult Note (Signed)
Consultation  Referring Provider:     ED/[facility] Primary Care Physician:  Shirline Frees, MD Primary Gastroenterologist:        Carlean Purl Reason for Consultation:     Iron deficiency anemia     Impression / Plan:   Recurrent iron deficiency anemia with prior heme positive stool in the setting of Plavix and aspirin use for coronary artery disease status post drug-eluting stents.  The anemia is now symptomatic with dyspnea on exertion and a hemoglobin of seven down from nine about 6 weeks ago.  Previous work-up in twenty eighteen showed an inflammatory colon polyp only.  Plan for transfusion as outlined by the ED.  If he is admitted as an inpatient which I think is reasonable as he needs inpatient work-up at this point regarding EGD and colonoscopy, would try to give a dose of Feraheme or some other type of parenteral iron while he is here.  He needs extra prep which should start today when he gets to the floor and will need an abdominal binder for his colonoscopy.  If his EGD and colonoscopy are unrevealing a capsule endoscopy as an inpatient versus outpatient would be appropriate.  Continue to hold Plavix.  Aspirin is acceptable and appropriate in the setting of his stents.  Continue antibiotics for left foot infection defer to hospitalist.  I appreciate the help of [facility] hospitalist in the care of this patient.        HPI:   [name] is a 74 y.o. male with a history of iron deficiency anemia who was seen in our office on November 28, 2019 with recurrent iron deficiency anemia problems and set up for a colonoscopy on November 4 as an outpatient.  In the interim he had a hemoglobin recheck this week and it was 7.5 and yesterday it was seven.  He has been having increasing dyspnea on exertion but no chest pain.  I had him hold his Plavix about 2 days ago before I recheck the hemoglobin yesterday.  He has had dark brown stools but no melena.  Details of EGD and colonoscopy in twenty  eighteen when he presented in a similar fashion to the hospital demonstrated an inflammatory transverse colon polyp and normal EGD.  He had to have a repeat colonoscopy as an outpatient with a 2-day prep due to poor prep on initial colonoscopy and abdominal binder necessary for passage of the scope.  He had seen cardiology in August of this year and had heme positive stool and anemia.  He has also seen hematology with Dr. Marin Olp and [facility] and the plan was for iron infusions but he has been reluctant to do that because of concerned about costs.  That is being worked on.  Five benefit her infusions were anticipated.  His ferritin was twenty-six on September 8.  Hemoglobin 9.3 that day with an MCV eighty-nine and RDW sixteen.  Dr. Alvino Chapel of the emergency department has kindly evaluated him today and plans for 2 units of packed red cells with an admission to the hospital for this and other care that is necessary.     Colonoscopy 08/31/2016 by Dr. Carlean Purl: - A tattoo was seen in the transverse colon. A post-polypectomy scar was found at the tattoo site. There was no evidence of residual polyp tissue. - The examination was otherwise normal on direct and retroflexion views. - No specimens collected. Note: An abdominal binder was placed on his abdomen and he had a double prep used  as last time could not complete colonoscopy due to poor prep and looping.  Colonoscopy 07/20/2016: - Preparation of the colon was unsatisfactory. - The colonoscopy was performed with difficulty due to unsatisfactory   bowel prep and significant looping. - One 15 mm polyp in the transverse colon, removed with a hot snare. Resected and   retrieved. Tattooed. It was soft but ulcerated - ? inflammatory vs neoplastic. Could account   for chronic blood loss and his anemia - Stool in the rectum, in the sigmoid colon, in the descending colon and in the transverse  colon. - Repeat colonoscopy with 2 day prep - Needs  to have extralarge abdominal binder available in [place] day we do colonoscopy Surgical [P], transverse, polyp - INFLAMMATORY POLYP WITH EXUDATE AND EROSION. - NO GRANULOMAS, ADENOMATOUS CHANGE OR MALIGNANCY.   Past Medical History:  Diagnosis Date  . Adrenal insufficiency ([place])   . Allergy   . Anemia   . Arthritis    feet   . Broken foot 09/2019   had to wore a boot. Left   . CKD (chronic kidney disease), stage III ([place])   . Coronary artery disease    has stents  . Coronary atherosclerosis of native coronary artery    Proximal LAD, posterior lateral stent widely patent-10/12/11  . Diabetes mellitus    insulin and pills  . Hearing loss    wears hearing aids  . Heart attack ([place]) 2010  . History of blood transfusion 06/30/2016   [facility] - 2 units transfused  . Hypertension   . OSA (obstructive sleep apnea)    uses VPAC sleep study 2 years done through [place]. Dr. Marlou Porch arranged study  . Pneumonia    3-4 years ago  . Sarcoid   . Sarcoidosis   . Sleep apnea    uses CPAP nightly  . Thyroid disease   . Type 2 diabetes mellitus ([place])     Past Surgical History:  Procedure Laterality Date  . APPENDECTOMY    . CATARACT EXTRACTION  2011   bilat  . CHOLECYSTECTOMY  01/25/2011   Procedure: LAPAROSCOPIC CHOLECYSTECTOMY WITH INTRAOPERATIVE CHOLANGIOGRAM;  Surgeon: Judieth Keens, DO;  Location: [facility];  Service: General;  Laterality: N/A;  . COLONOSCOPY     several  . CORONARY ANGIOPLASTY     most recent 11/2009  . CORONARY STENT INTERVENTION N/A 08/07/2018   Procedure: CORONARY STENT INTERVENTION;  Surgeon: Jettie Booze, MD;  Location: [facility];  Service: Cardiovascular;  Laterality: N/A;  . CORONARY STENT PLACEMENT  2009   in LAD and side branch PTCA  . INTERCOSTAL NERVE BLOCK  2011, 06/2016   x2. lumbar spine  . LEFT HEART CATHETERIZATION WITH CORONARY ANGIOGRAM Bilateral 10/12/2011   Procedure: LEFT HEART CATHETERIZATION WITH CORONARY ANGIOGRAM;  Surgeon:  Candee Furbish, MD;  Location: [facility] CATH LAB;  Service: Cardiovascular;  Laterality: Bilateral;  . LEFT HEART CATHETERIZATION WITH CORONARY ANGIOGRAM N/A 04/01/2014   Procedure: LEFT HEART CATHETERIZATION WITH CORONARY ANGIOGRAM;  Surgeon: Candee Furbish, MD;  Location: [facility] CATH LAB;  Service: Cardiovascular;  Laterality: N/A;  . RIGHT/LEFT HEART CATH AND CORONARY ANGIOGRAPHY N/A 08/07/2018   Procedure: RIGHT/LEFT HEART CATH AND CORONARY ANGIOGRAPHY;  Surgeon: Jettie Booze, MD;  Location: [facility];  Service: Cardiovascular;  Laterality: N/A;    Family History  Problem Relation Age of Onset  . Kidney disease Mother   . Diabetes Mother   . Kidney cancer Mother   . Heart attack  Father   . Asthma Sister   . Anesthesia problems Sister        "Kidney's did not wake up"  . Sarcoidosis Sister   . Sarcoidosis Niece   . Colon cancer Neg Hx   . Colon polyps Neg Hx   . Rectal cancer Neg Hx   . Stomach cancer Neg Hx     Social History   Tobacco Use  . Smoking status: Never Smoker  . Smokeless tobacco: Never Used  Vaping Use  . Vaping Use: Never used  Substance Use Topics  . Alcohol use: No  . Drug use: No     Prior to Admission medications   Medication Sig Start Date End Date Taking? Authorizing Provider  acetaminophen (TYLENOL) 500 MG tablet Take 500 mg by mouth daily as needed for moderate pain or headache.     [provider]  albuterol (PROVENTIL) (2.5 MG/3ML) 0.083% nebulizer solution Take 3 mLs (2.5 mg total) by nebulization every 6 (six) hours as needed for wheezing or shortness of breath. Dx Code D86.0 07/24/14   Elsie Stain, MD  Albuterol Sulfate (PROAIR RESPICLICK) 979 (90 BASE) MCG/ACT AEPB Inhale 2 puffs into the lungs every 6 (six) hours as needed. 07/24/14   Elsie Stain, MD  allopurinol (ZYLOPRIM) 100 MG tablet TAKE 1 TABLET BY MOUTH TWICE A DAY 01/22/19   Suzan Slick, NP  aspirin EC 81 MG tablet Take 81 mg by mouth every evening.     [provider]  brimonidine (ALPHAGAN) 0.2 % ophthalmic solution 1 drop daily. 10/21/19   [provider]  Cholecalciferol (VITAMIN D3) 3000 UNITS TABS Take 3,000 Units by mouth daily.    [provider]  clopidogrel (PLAVIX) 75 MG tablet Take 1 tablet (75 mg total) by mouth daily. 04/17/14   Jerline Pain, MD  Coenzyme Q10 (COQ-10) 400 MG CAPS Take 400 mg by mouth every evening.    [provider]  Colchicine 0.6 MG CAPS Take 1 tablet by mouth 2 (two) times daily as needed. 10/12/18   Hilts, Legrand Como, MD  DROPLET INSULIN SYRINGE 31G X 5/16" 1 ML MISC  08/14/18   [provider]  fish oil-omega-3 fatty acids 1000 MG capsule Take 1 g by mouth daily.     [provider]  furosemide (LASIX) 80 MG tablet Take 1 tablet (80 mg total) by mouth daily. 10/07/18   Hongalgi, Lenis Dickinson, MD  glipiZIDE (GLUCOTROL) 10 MG tablet Take 10 mg by mouth every evening.     [provider]  insulin aspart protamine- aspart (NOVOLOG MIX 70/30) (70-30) 100 UNIT/ML injection Inject 0.3 mLs (30 Units total) into the skin 2 (two) times daily with a meal. 10/07/18   Hongalgi, Lenis Dickinson, MD  isosorbide mononitrate (IMDUR) 30 MG 24 hr tablet Take 1 tablet (30 mg total) by mouth daily. 10/11/19 01/09/20  [name] T, PA-C  levothyroxine (SYNTHROID, LEVOTHROID) 50 MCG tablet Take 50 mcg by mouth daily.  06/21/16   [provider]  losartan (COZAAR) 50 MG tablet Take 1 tablet (50 mg total) by mouth daily. 10/11/19 01/09/20  [name] T, PA-C  montelukast (SINGULAIR) 10 MG tablet Take 10 mg by mouth daily as needed (allergies).  07/18/18   [provider]  Multiple Vitamin (MULTIVITAMIN WITH MINERALS) TABS tablet Take 1 tablet by mouth daily. 10/08/18   Hongalgi, Lenis Dickinson, MD  pantoprazole (PROTONIX) 40 MG tablet Take 40 mg by mouth daily. 03/21/18   [provider]  polyethylene glycol powder (GLYCOLAX/MIRALAX) 17 GM/SCOOP powder Take as directed by physician for  colonoscopy prep 12/04/19   Noralyn Pick, NP  predniSONE (DELTASONE) 10 MG tablet Take 1 tablet (10 mg total) by mouth daily with breakfast. T 05/15/15   Parrett, Tammy S, NP  rosuvastatin (CRESTOR) 10 MG tablet TAKE 1 TABLET AT BEDTIME 07/10/19   Jerline Pain, MD  sodium chloride (OCEAN) 0.65 % SOLN nasal spray Place 1 spray into both nostrils as needed for congestion.    [provider]  sulfamethoxazole-trimethoprim (BACTRIM DS) 800-160 MG tablet Take 1 tablet by mouth 2 (two) times daily. 12/24/19   Persons, Bevely Palmer, PA  Vitamin D, Ergocalciferol, (DRISDOL) 50000 UNITS CAPS capsule Take 50,000 Units by mouth every Sunday.  12/21/13   [provider]    No current facility-administered medications for this encounter.   Current Outpatient Medications  Medication Sig Dispense Refill  . acetaminophen (TYLENOL) 500 MG tablet Take 500 mg by mouth daily as needed for moderate pain or headache.     . albuterol (PROVENTIL) (2.5 MG/3ML) 0.083% nebulizer solution Take 3 mLs (2.5 mg total) by nebulization every 6 (six) hours as needed for wheezing or shortness of breath. Dx Code D86.0 360 mL 5  . Albuterol Sulfate (PROAIR RESPICLICK) 767 (90 BASE) MCG/ACT AEPB Inhale 2 puffs into the lungs every 6 (six) hours as needed. 1 each 6  . allopurinol (ZYLOPRIM) 100 MG tablet TAKE 1 TABLET BY MOUTH TWICE A DAY 180 tablet 1  . aspirin EC 81 MG tablet Take 81 mg by mouth every evening.     . brimonidine (ALPHAGAN) 0.2 % ophthalmic solution 1 drop daily.    . Cholecalciferol (VITAMIN D3) 3000 UNITS TABS Take 3,000 Units by mouth daily.    . clopidogrel (PLAVIX) 75 MG tablet Take 1 tablet (75 mg total) by mouth daily. 90 tablet 4  . Coenzyme Q10 (COQ-10) 400 MG CAPS Take 400 mg by mouth every evening.    . Colchicine 0.6 MG CAPS Take 1 tablet by mouth 2 (two) times daily as needed. 14 capsule 1  . DROPLET INSULIN SYRINGE 31G X 5/16" 1 ML MISC     . fish oil-omega-3 fatty acids 1000  MG capsule Take 1 g by mouth daily.     . furosemide (LASIX) 80 MG tablet Take 1 tablet (80 mg total) by mouth daily. 30 tablet 0  . glipiZIDE (GLUCOTROL) 10 MG tablet Take 10 mg by mouth every evening.     . insulin aspart protamine- aspart (NOVOLOG MIX 70/30) (70-30) 100 UNIT/ML injection Inject 0.3 mLs (30 Units total) into the skin 2 (two) times daily with a meal.    . isosorbide mononitrate (IMDUR) 30 MG 24 hr tablet Take 1 tablet (30 mg total) by mouth daily. 90 tablet 3  . levothyroxine (SYNTHROID, LEVOTHROID) 50 MCG tablet Take 50 mcg by mouth daily.     Marland Kitchen losartan (COZAAR) 50 MG tablet Take 1 tablet (50 mg total) by mouth daily. 90 tablet 3  . montelukast (SINGULAIR) 10 MG tablet Take 10 mg by mouth daily as needed (allergies).     . Multiple Vitamin (MULTIVITAMIN WITH MINERALS) TABS tablet Take 1 tablet by mouth daily.    . pantoprazole (PROTONIX) 40 MG tablet Take 40 mg by mouth daily.    . polyethylene glycol powder (GLYCOLAX/MIRALAX) 17 GM/SCOOP powder Take as directed by physician for colonoscopy prep 255 g 3  . predniSONE (DELTASONE) 10 MG tablet  Take 1 tablet (10 mg total) by mouth daily with breakfast. T 30 tablet 6  . rosuvastatin (CRESTOR) 10 MG tablet TAKE 1 TABLET AT BEDTIME 90 tablet 3  . sodium chloride (OCEAN) 0.65 % SOLN nasal spray Place 1 spray into both nostrils as needed for congestion.    . sulfamethoxazole-trimethoprim (BACTRIM DS) 800-160 MG tablet Take 1 tablet by mouth 2 (two) times daily. 60 tablet 0  . Vitamin D, Ergocalciferol, (DRISDOL) 50000 UNITS CAPS capsule Take 50,000 Units by mouth every Sunday.   1    Allergies as of 01/01/2020 - Review Complete 01/01/2020  Allergen Reaction Noted  . Statins Other (See Comments) 11/08/2010  . Hydrocortisone Nausea Only 05/22/2008  . Miralax [polyethylene glycol] Nausea And Vomiting 12/06/2019  . Metolazone Other (See Comments) 08/02/2018     Review of Systems:    This is positive for those things mentioned in  the HPI, also positive for rib fractures on the right in September on his birthday [birth year].  He also has an ongoing left foot infection that may be osteomyelitis followed by Dr. Sharol Given.. All other review of systems are negative.       Physical Exam:  Vital signs in last 24 hours: Temp:  [97.9 F (36.6 C)] 97.9 F (36.6 C) (10/27 1147) Pulse Rate:  [69] 69 (10/27 1147) Resp:  [18] 18 (10/27 1147) BP: (127)/(61) 127/61 (10/27 1147) SpO2:  [99 %] 99 % (10/27 1147) Weight:  [105.2 kg] 105.2 kg (10/27 1154)    General:  Well-developed, well-nourished and in no acute distress Eyes:  anicteric. Lungs: Clear to auscultation bilaterally. Heart:  S1S2, no rubs, murmurs, gallops. Abdomen: Obese soft, non-tender, no hepatosplenomegaly, hernia, or mass and BS+.  Neuro:  A&O x 3.  Psych:  appropriate mood and  Affect.   Data Reviewed:   LAB RESULTS: Recent Labs    12/30/19 1502 12/31/19 1515  WBC 13.1* 11.3*  HGB 7.5 Repeated and verified X2.* 7.0 Repeated and verified X2.*  HCT 24.2* 22.2 Repeated and verified X2.*  PLT 457.0* 456.0*   BMET No results for input(s): NA, K, CL, CO2, GLUCOSE, BUN, CREATININE, CALCIUM in the last 72 hours. LFT No results for input(s): PROT, ALBUMIN, AST, ALT, ALKPHOS, BILITOT, BILIDIR, IBILI in the last 72 hours. PT/INR No results for input(s): LABPROT, INR in the last 72 hours.  STUDIES: No results found.   PREVIOUS ENDOSCOPIES:            See HPI   Thanks   LOS: 0 days   @Torie Priebe  Simonne Maffucci, MD, [facility] @  01/01/2020, 12:53 PM

## 2020-01-01 NOTE — ED Notes (Signed)
Attempted x2 to place PIV. Consulting with another RN to utilize [place] machine.

## 2020-01-01 NOTE — Telephone Encounter (Signed)
Was asked to followup on patient needing iron due to Hgb dropping to below 7.  Discussed what the patients insurance covers which is Venofer 5 doses.  Patient informed me he is on the way to the ER since his Hgb has dropped so severely and will possibly be admitted.  Asked patient to call us once he is out of the hospital if he still needs IV iron.  Patient in agreement.

## 2020-01-01 NOTE — ED Notes (Signed)
Report given to 4W RN.

## 2020-01-01 NOTE — ED Provider Notes (Signed)
[facility] DEPT Provider Note   CSN: [id number] Arrival date & time: 01/01/20  1126     History Chief Complaint  Patient presents with  . Abnormal Lab    [name] is a 74 y.o. male.  HPI Patient sent in for admission with anemia due to GI bleed.  Has been seen by Dr. Carlean Purl from gastroenterology.  Hemoglobin is gone down from about 10 a month ago down to 7.5 2 days ago and 7 yesterday.  States he does feel short of breath.  Some fatigue.  No chest pain.  States has been having black stool and reviewing records has been guaiac positive.  Had been planned for colonoscopy but reportedly unable to wait.  No abdominal pain.  He is iron deficient and iron infusions had been planned but not able to get them done yet.      Past Medical History:  Diagnosis Date  . Adrenal insufficiency ([place])   . Allergy   . Anemia   . Arthritis    feet   . Broken foot 09/2019   had to wore a boot. Left   . CKD (chronic kidney disease), stage III ([place])   . Coronary artery disease    has stents  . Coronary atherosclerosis of native coronary artery    Proximal LAD, posterior lateral stent widely patent-10/12/11  . Diabetes mellitus    insulin and pills  . Hearing loss    wears hearing aids  . Heart attack ([place]) 2010  . History of blood transfusion 06/30/2016   [facility] - 2 units transfused  . Hypertension   . OSA (obstructive sleep apnea)    uses VPAC sleep study 2 years done through [facility]. Dr. Marlou Porch arranged study  . Pneumonia    3-4 years ago  . Sarcoid   . Sarcoidosis   . Sleep apnea    uses CPAP nightly  . Thyroid disease   . Type 2 diabetes mellitus [facility])     Patient Active Problem List   Diagnosis Date Noted  . Charcot's joint, left ankle and foot 11/06/2019  . Peripheral arterial disease ([place]) 07/12/2019  . Pain in right elbow 10/30/2018  . Acute kidney injury superimposed on chronic kidney disease ([place]) 10/04/2018  . AKI (acute kidney  injury) ([place]) 10/03/2018  . Hypoalbuminemia 10/03/2018  . Hypokalemia 10/03/2018  . Hypothyroidism 10/03/2018  . CKD (chronic kidney disease), stage III ([place])   . Iron deficiency anemia   . Symptomatic anemia 06/29/2016  . Chronic diastolic heart failure ([place]) 01/14/2014  . Stable angina ([place]) 01/14/2014  . Obesity 01/14/2014  . Pulmonary sarcoidosis ([place]) 01/14/2014  . Uncontrolled diabetes mellitus ([place]) 01/14/2014  . Hyperlipidemia 01/14/2014  . Coronary atherosclerosis of native coronary artery   . Sinusitis, chronic 05/30/2011  . Sleep apnea 10/09/2008  . ADRENAL INSUFFICIENCY, HX OF 02/21/2008  . Coronary artery disease 11/20/2007  . Sarcoidosis 02/20/2007  . Diabetes mellitus without complication ([place]) 40/98/1191  . GERD 12/20/2006    Past Surgical History:  Procedure Laterality Date  . APPENDECTOMY    . CATARACT EXTRACTION  2011   bilat  . CHOLECYSTECTOMY  01/25/2011   Procedure: LAPAROSCOPIC CHOLECYSTECTOMY WITH INTRAOPERATIVE CHOLANGIOGRAM;  Surgeon: Judieth Keens, DO;  Location: [facility];  Service: General;  Laterality: N/A;  . COLONOSCOPY     several  . CORONARY ANGIOPLASTY     most recent 11/2009  . CORONARY STENT INTERVENTION N/A 08/07/2018   Procedure: CORONARY STENT INTERVENTION;  Surgeon:  Jettie Booze, MD;  Location: [facility] CV LAB;  Service: Cardiovascular;  Laterality: N/A;  . CORONARY STENT PLACEMENT  2009   in LAD and side branch PTCA  . INTERCOSTAL NERVE BLOCK  2011, 06/2016   x2. lumbar spine  . LEFT HEART CATHETERIZATION WITH CORONARY ANGIOGRAM Bilateral 10/12/2011   Procedure: LEFT HEART CATHETERIZATION WITH CORONARY ANGIOGRAM;  Surgeon: Candee Furbish, MD;  Location[facility] CATH LAB;  Service: Cardiovascular;  Laterality: Bilateral;  . LEFT HEART CATHETERIZATION WITH CORONARY ANGIOGRAM N/A 04/01/2014   Procedure: LEFT HEART CATHETERIZATION WITH CORONARY ANGIOGRAM;  Surgeon: Candee Furbish, MD;  Location: [facility] CATH LAB;  Service: Cardiovascular;   Laterality: N/A;  . RIGHT/LEFT HEART CATH AND CORONARY ANGIOGRAPHY N/A 08/07/2018   Procedure: RIGHT/LEFT HEART CATH AND CORONARY ANGIOGRAPHY;  Surgeon: Jettie Booze, MD;  Location: [facility] CV LAB;  Service: Cardiovascular;  Laterality: N/A;       Family History  Problem Relation Age of Onset  . Kidney disease Mother   . Diabetes Mother   . Kidney cancer Mother   . Heart attack Father   . Asthma Sister   . Anesthesia problems Sister        "Kidney's did not wake up"  . Sarcoidosis Sister   . Sarcoidosis Niece   . Colon cancer Neg Hx   . Colon polyps Neg Hx   . Rectal cancer Neg Hx   . Stomach cancer Neg Hx     Social History   Tobacco Use  . Smoking status: Never Smoker  . Smokeless tobacco: Never Used  Vaping Use  . Vaping Use: Never used  Substance Use Topics  . Alcohol use: No  . Drug use: No    Home Medications Prior to Admission medications   Medication Sig Start Date End Date Taking? Authorizing Provider  acetaminophen (TYLENOL) 500 MG tablet Take 500 mg by mouth daily as needed for moderate pain or headache.     [provider]  albuterol (PROVENTIL) (2.5 MG/3ML) 0.083% nebulizer solution Take 3 mLs (2.5 mg total) by nebulization every 6 (six) hours as needed for wheezing or shortness of breath. Dx Code D86.0 07/24/14   Elsie Stain, MD  Albuterol Sulfate (PROAIR RESPICLICK) 683 (90 BASE) MCG/ACT AEPB Inhale 2 puffs into the lungs every 6 (six) hours as needed. 07/24/14   Elsie Stain, MD  allopurinol (ZYLOPRIM) 100 MG tablet TAKE 1 TABLET BY MOUTH TWICE A DAY 01/22/19   Suzan Slick, NP  aspirin EC 81 MG tablet Take 81 mg by mouth every evening.     [provider]  brimonidine (ALPHAGAN) 0.2 % ophthalmic solution 1 drop daily. 10/21/19   [provider]  Cholecalciferol (VITAMIN D3) 3000 UNITS TABS Take 3,000 Units by mouth daily.    [provider]  clopidogrel (PLAVIX) 75 MG tablet Take 1 tablet (75 mg  total) by mouth daily. 04/17/14   Jerline Pain, MD  Coenzyme Q10 (COQ-10) 400 MG CAPS Take 400 mg by mouth every evening.    [provider]  Colchicine 0.6 MG CAPS Take 1 tablet by mouth 2 (two) times daily as needed. 10/12/18   Hilts, Legrand Como, MD  DROPLET INSULIN SYRINGE 31G X 5/16" 1 ML MISC  08/14/18   [provider]  fish oil-omega-3 fatty acids 1000 MG capsule Take 1 g by mouth daily.     [provider]  furosemide (LASIX) 80 MG tablet Take 1 tablet (80 mg total) by mouth daily.  10/07/18   Hongalgi, Lenis Dickinson, MD  glipiZIDE (GLUCOTROL) 10 MG tablet Take 10 mg by mouth every evening.     [provider]  insulin aspart protamine- aspart (NOVOLOG MIX 70/30) (70-30) 100 UNIT/ML injection Inject 0.3 mLs (30 Units total) into the skin 2 (two) times daily with a meal. 10/07/18   Hongalgi, Lenis Dickinson, MD  isosorbide mononitrate (IMDUR) 30 MG 24 hr tablet Take 1 tablet (30 mg total) by mouth daily. 10/11/19 01/09/20  [name] T, PA-C  levothyroxine (SYNTHROID, LEVOTHROID) 50 MCG tablet Take 50 mcg by mouth daily.  06/21/16   [provider]  losartan (COZAAR) 50 MG tablet Take 1 tablet (50 mg total) by mouth daily. 10/11/19 01/09/20  [name] T, PA-C  montelukast (SINGULAIR) 10 MG tablet Take 10 mg by mouth daily as needed (allergies).  07/18/18   [provider]  Multiple Vitamin (MULTIVITAMIN WITH MINERALS) TABS tablet Take 1 tablet by mouth daily. 10/08/18   Hongalgi, Lenis Dickinson, MD  pantoprazole (PROTONIX) 40 MG tablet Take 40 mg by mouth daily. 03/21/18   [provider]  polyethylene glycol powder (GLYCOLAX/MIRALAX) 17 GM/SCOOP powder Take as directed by physician for colonoscopy prep 12/04/19   Noralyn Pick, NP  predniSONE (DELTASONE) 10 MG tablet Take 1 tablet (10 mg total) by mouth daily with breakfast. T 05/15/15   Parrett, Tammy S, NP  rosuvastatin (CRESTOR) 10 MG tablet TAKE 1 TABLET AT BEDTIME 07/10/19   Jerline Pain, MD  sodium  chloride (OCEAN) 0.65 % SOLN nasal spray Place 1 spray into both nostrils as needed for congestion.    [provider]  sulfamethoxazole-trimethoprim (BACTRIM DS) 800-160 MG tablet Take 1 tablet by mouth 2 (two) times daily. 12/24/19   Persons, Bevely Palmer, PA  Vitamin D, Ergocalciferol, (DRISDOL) 50000 UNITS CAPS capsule Take 50,000 Units by mouth every Sunday.  12/21/13   [provider]    Allergies    Statins, Hydrocortisone, Miralax [polyethylene glycol], and Metolazone  Review of Systems   Review of Systems  Constitutional: Negative for appetite change.  HENT: Negative for congestion.   Respiratory: Positive for shortness of breath.   Cardiovascular: Negative for chest pain.  Gastrointestinal: Positive for blood in stool. Negative for abdominal pain.  Genitourinary: Negative for flank pain.  Musculoskeletal: Negative for back pain.  Skin: Negative for rash.  Neurological: Negative for weakness.  Psychiatric/Behavioral: Negative for confusion.    Physical Exam Updated Vital Signs BP 127/61 (BP Location: Right Arm)   Pulse 69   Temp 97.9 F (36.6 C) (Oral)   Resp 18   Ht 5\' 10"  (1.778 m)   Wt 105.2 kg   SpO2 99%   BMI 33.29 kg/m   Physical Exam Vitals and nursing note reviewed.  HENT:     Head: Normocephalic.  Eyes:     Pupils: Pupils are equal, round, and reactive to light.  Cardiovascular:     Rate and Rhythm: Normal rate.  Pulmonary:     Breath sounds: No wheezing or rhonchi.  Abdominal:     Tenderness: There is no abdominal tenderness.  Musculoskeletal:        General: No tenderness.  Skin:    Capillary Refill: Capillary refill takes less than 2 seconds.     Coloration: Skin is not pale.  Neurological:     Mental Status: He is alert and oriented to person, place, and time.  Psychiatric:        Mood and Affect: Mood normal.  ED Results / Procedures / Treatments   Labs (all labs ordered are listed, but only abnormal results are  displayed) Labs Reviewed  CBC WITH DIFFERENTIAL/PLATELET - Abnormal; Notable for the following components:      Result Value   WBC 13.3 (*)    RBC 2.77 (*)    Hemoglobin 7.1 (*)    HCT 24.6 (*)    MCH 25.6 (*)    MCHC 28.9 (*)    RDW 17.2 (*)    Platelets 459 (*)    Neutro Abs 9.4 (*)    Monocytes Absolute 1.1 (*)    Abs Immature Granulocytes 0.30 (*)    All other components within normal limits  COMPREHENSIVE METABOLIC PANEL - Abnormal; Notable for the following components:   Glucose, Bld 134 (*)    BUN 29 (*)    Creatinine, Ser 1.75 (*)    Calcium 8.7 (*)    Albumin 3.2 (*)    GFR, Estimated 40 (*)    All other components within normal limits  PREPARE RBC (CROSSMATCH)  TYPE AND SCREEN    EKG None  Radiology No results found.  Procedures Procedures (including critical care time)  Medications Ordered in ED Medications  0.9 %  sodium chloride infusion (10 mL/hr Intravenous New Bag/Given 01/01/20 1251)    ED Course  I have reviewed the triage vital signs and the nursing notes.  Pertinent labs & imaging results that were available during my care of the patient were reviewed by me and considered in my medical decision making (see chart for details).    MDM Rules/Calculators/A&P                          Patient presents with anemia.  Worsening.  Vitals reassuring however.  However has been guaiac positive and presumed still bleeding at this time.  [facility] sent in for admission.  Discussed with them and they will see in consult.  Will admit to hospitalist.  We will also transfuse 2 units of PRBCs.  Discussed with Dr. Carlean Purl.  Likely will do scopes on Friday as an inpatient.  CRITICAL CARE Performed by: Davonna Belling Total critical care time: 30 minutes Critical care time was exclusive of separately billable procedures and treating other patients. Critical care was necessary to treat or prevent imminent or life-threatening deterioration. Critical care was  time spent personally by me on the following activities: development of treatment plan with patient and/or surrogate as well as nursing, discussions with consultants, evaluation of patient's response to treatment, examination of patient, obtaining history from patient or surrogate, ordering and performing treatments and interventions, ordering and review of laboratory studies, ordering and review of radiographic studies, pulse oximetry and re-evaluation of patient's condition.  Lab works reviewed.  Differential diagnosis is GI bleed either of upper or lower source.  Has had melanotic stool which would go with more of an upper source.    Final Clinical Impression(s) / ED Diagnoses Final diagnoses:  Gastrointestinal hemorrhage, unspecified gastrointestinal hemorrhage type  Acute blood loss anemia  Iron deficiency anemia, unspecified iron deficiency anemia type    Rx / DC Orders ED Discharge Orders    None       Davonna Belling, MD 01/01/20 1357

## 2020-01-01 NOTE — H&P (Signed)
History and Physical        Hospital Admission Note Date: 01/01/2020  Patient name: [name] Medical record number: [id number] Date of birth: 07-15-1945 Age: 74 y.o. Gender: male  PCP: Shirline Frees, MD  Patient coming from: home   Chief Complaint    Chief Complaint  Patient presents with  . Abnormal Lab      HPI:   This is a 74 year old male with past medical history of iron deficiency anemia, CAD s/p stent, OSA, sarcoidosis, left ankle subacute osteomyelitis (currently on Bactrim) type 2 diabetes who follows regularly with GI for his iron deficiency and presented to the ED for abnormal lab work for anemia by Dr. Carlean Purl, GI.  He was set up for colonoscopy on November 4 as an outpatient and had a Hb check this week which showed Hb 7.5.  Dr. Carlean Purl, GI, recommended holding Plavix for 2 days then recheck Hb at which point it returned at 7.  Has had dark brown stools without melena.  Regarding his iron deficiency anemia he was seen by Dr. Marin Olp and the plan was for iron infusions but the patient has been reluctant to do this due to cost.  Per Dr. Celesta Aver note, his ferritin was 26 on September 8 and Hb 9.3 at the time.  ED Course: Afebrile and hemodynamically stable on room air.  Notable labs: BUN 29, creatinine 1.75 (at baseline), WBC 13 (chronically elevated), Hb 7.1 (7.0 yesterday), MCV 88, platelets 459.  He was seen by GI in the ED and had 2 units PRBCs were ordered by the ED.  Vitals:   01/01/20 1712 01/01/20 1758  BP: 132/65 (!) 149/69  Pulse: 63 64  Resp: 19 16  Temp: 97.9 F (36.6 C) 97.9 F (36.6 C)  SpO2: 99% 100%     Review of Systems:  Review of Systems  Constitutional: Negative for chills and fever.  Respiratory: Negative for cough and wheezing.   Cardiovascular: Negative for leg swelling.       Dyspnea on exertion with associated chest pain on  exertion  Gastrointestinal: Negative for abdominal pain, nausea and vomiting.  All other systems reviewed and are negative.   Medical/Social/Family History   Past Medical History: Past Medical History:  Diagnosis Date  . Adrenal insufficiency ([place])   . Allergy   . Anemia   . Arthritis    feet   . Broken foot 09/2019   had to wore a boot. Left   . CKD (chronic kidney disease), stage III ([place])   . Coronary artery disease    has stents  . Coronary atherosclerosis of native coronary artery    Proximal LAD, posterior lateral stent widely patent-10/12/11  . Diabetes mellitus    insulin and pills  . Hearing loss    wears hearing aids  . Heart attack ([place]) 2010  . History of blood transfusion 06/30/2016   [facility] - 2 units transfused  . Hypertension   . OSA (obstructive sleep apnea)    uses VPAC sleep study 2 years done through [facility]. Dr. Marlou Porch arranged study  . Pneumonia    3-4 years ago  . Sarcoid   . Sarcoidosis   . Sleep apnea  uses CPAP nightly  . Thyroid disease   . Type 2 diabetes mellitus ([place])     Past Surgical History:  Procedure Laterality Date  . APPENDECTOMY    . CATARACT EXTRACTION  2011   bilat  . CHOLECYSTECTOMY  01/25/2011   Procedure: LAPAROSCOPIC CHOLECYSTECTOMY WITH INTRAOPERATIVE CHOLANGIOGRAM;  Surgeon: Judieth Keens, DO;  Location: [facility];  Service: General;  Laterality: N/A;  . COLONOSCOPY     several  . CORONARY ANGIOPLASTY     most recent 11/2009  . CORONARY STENT INTERVENTION N/A 08/07/2018   Procedure: CORONARY STENT INTERVENTION;  Surgeon: Jettie Booze, MD;  Location: [facility] [place] CV LAB;  Service: Cardiovascular;  Laterality: N/A;  . CORONARY STENT PLACEMENT  2009   in LAD and side branch PTCA  . INTERCOSTAL NERVE BLOCK  2011, 06/2016   x2. lumbar spine  . LEFT HEART CATHETERIZATION WITH CORONARY ANGIOGRAM Bilateral 10/12/2011   Procedure: LEFT HEART CATHETERIZATION WITH CORONARY ANGIOGRAM;  Surgeon: Candee Furbish, MD;   [facility] LAB;  Service: Cardiovascular;  Laterality: Bilateral;  . LEFT HEART CATHETERIZATION WITH CORONARY ANGIOGRAM N/A 04/01/2014   Procedure: LEFT HEART CATHETERIZATION WITH CORONARY ANGIOGRAM;  Surgeon: Candee Furbish, MD;  Location: [facility] CATH LAB;  Service: Cardiovascular;  Laterality: N/A;  . RIGHT/LEFT HEART CATH AND CORONARY ANGIOGRAPHY N/A 08/07/2018   Procedure: RIGHT/LEFT HEART CATH AND CORONARY ANGIOGRAPHY;  Surgeon: Jettie Booze, MD;  Location: [place] CV LAB;  Service: Cardiovascular;  Laterality: N/A;    Medications: Prior to Admission medications   Medication Sig Start Date End Date Taking? Authorizing Provider  acetaminophen (TYLENOL) 500 MG tablet Take 500 mg by mouth daily as needed for moderate pain or headache.     [provider]  albuterol (PROVENTIL) (2.5 MG/3ML) 0.083% nebulizer solution Take 3 mLs (2.5 mg total) by nebulization every 6 (six) hours as needed for wheezing or shortness of breath. Dx Code D86.0 07/24/14   Elsie Stain, MD  Albuterol Sulfate (PROAIR RESPICLICK) 443 (90 BASE) MCG/ACT AEPB Inhale 2 puffs into the lungs every 6 (six) hours as needed. 07/24/14   Elsie Stain, MD  allopurinol (ZYLOPRIM) 100 MG tablet TAKE 1 TABLET BY MOUTH TWICE A DAY 01/22/19   Suzan Slick, NP  aspirin EC 81 MG tablet Take 81 mg by mouth every evening.     [provider]  brimonidine (ALPHAGAN) 0.2 % ophthalmic solution 1 drop daily. 10/21/19   [provider]  Cholecalciferol (VITAMIN D3) 3000 UNITS TABS Take 3,000 Units by mouth daily.    [provider]  clopidogrel (PLAVIX) 75 MG tablet Take 1 tablet (75 mg total) by mouth daily. 04/17/14   Jerline Pain, MD  Coenzyme Q10 (COQ-10) 400 MG CAPS Take 400 mg by mouth every evening.    [provider]  Colchicine 0.6 MG CAPS Take 1 tablet by mouth 2 (two) times daily as needed. 10/12/18   Hilts, Legrand Como, MD  DROPLET INSULIN SYRINGE 31G X 5/16" 1 ML MISC  08/14/18    [provider]  fish oil-omega-3 fatty acids 1000 MG capsule Take 1 g by mouth daily.     [provider]  furosemide (LASIX) 80 MG tablet Take 1 tablet (80 mg total) by mouth daily. 10/07/18   Hongalgi, Lenis Dickinson, MD  glipiZIDE (GLUCOTROL) 10 MG tablet Take 10 mg by mouth every evening.     [provider]  insulin aspart protamine- aspart (NOVOLOG MIX 70/30) (70-30) 100 UNIT/ML injection  Inject 0.3 mLs (30 Units total) into the skin 2 (two) times daily with a meal. 10/07/18   Hongalgi, Lenis Dickinson, MD  isosorbide mononitrate (IMDUR) 30 MG 24 hr tablet Take 1 tablet (30 mg total) by mouth daily. 10/11/19 01/09/20  [name] T, PA-C  levothyroxine (SYNTHROID, LEVOTHROID) 50 MCG tablet Take 50 mcg by mouth daily.  06/21/16   [provider]  losartan (COZAAR) 50 MG tablet Take 1 tablet (50 mg total) by mouth daily. 10/11/19 01/09/20  [name] T, PA-C  montelukast (SINGULAIR) 10 MG tablet Take 10 mg by mouth daily as needed (allergies).  07/18/18   [provider]  Multiple Vitamin (MULTIVITAMIN WITH MINERALS) TABS tablet Take 1 tablet by mouth daily. 10/08/18   Hongalgi, Lenis Dickinson, MD  pantoprazole (PROTONIX) 40 MG tablet Take 40 mg by mouth daily. 03/21/18   [provider]  polyethylene glycol powder (GLYCOLAX/MIRALAX) 17 GM/SCOOP powder Take as directed by physician for colonoscopy prep 12/04/19   Noralyn Pick, NP  predniSONE (DELTASONE) 10 MG tablet Take 1 tablet (10 mg total) by mouth daily with breakfast. T 05/15/15   Parrett, Tammy S, NP  rosuvastatin (CRESTOR) 10 MG tablet TAKE 1 TABLET AT BEDTIME 07/10/19   Jerline Pain, MD  sodium chloride (OCEAN) 0.65 % SOLN nasal spray Place 1 spray into both nostrils as needed for congestion.    [provider]  sulfamethoxazole-trimethoprim (BACTRIM DS) 800-160 MG tablet Take 1 tablet by mouth 2 (two) times daily. 12/24/19   Persons, Bevely Palmer, PA  Vitamin D, Ergocalciferol, (DRISDOL) 50000  UNITS CAPS capsule Take 50,000 Units by mouth every Sunday.  12/21/13   [provider]    Allergies:   Allergies  Allergen Reactions  . Statins Other (See Comments)    Pt says they make him "mean"  . Hydrocortisone Nausea Only  . Miralax [Polyethylene Glycol] Nausea And Vomiting    Patient refuses to take- reminded me today of this. ta  . Metolazone Other (See Comments)    Drained, no energy    Social History:  reports that he has never smoked. He has never used smokeless tobacco. He reports that he does not drink alcohol and does not use drugs.  Family History: Family History  Problem Relation Age of Onset  . Kidney disease Mother   . Diabetes Mother   . Kidney cancer Mother   . Heart attack Father   . Asthma Sister   . Anesthesia problems Sister        "Kidney's did not wake up"  . Sarcoidosis Sister   . Sarcoidosis Niece   . Colon cancer Neg Hx   . Colon polyps Neg Hx   . Rectal cancer Neg Hx   . Stomach cancer Neg Hx      Objective   Physical Exam: Blood pressure (!) 149/69, pulse 64, temperature 97.9 F (36.6 C), temperature source Oral, resp. rate 16, height 5\' 10"  (1.778 m), weight 105.2 kg, SpO2 100 %.  Physical Exam Vitals and nursing note reviewed.  Constitutional:      Appearance: Normal appearance.  HENT:     Head: Normocephalic and atraumatic.  Eyes:     Conjunctiva/sclera: Conjunctivae normal.  Cardiovascular:     Rate and Rhythm: Normal rate and regular rhythm.  Pulmonary:     Effort: Pulmonary effort is normal.     Breath sounds: Normal breath sounds.  Abdominal:     General: Abdomen is flat.     Palpations:  Abdomen is soft.  Musculoskeletal:        General: No swelling or tenderness.  Skin:    Coloration: Skin is not jaundiced or pale.  Neurological:     Mental Status: He is alert. Mental status is at baseline.  Psychiatric:        Mood and Affect: Mood normal.        Behavior: Behavior normal.     LABS on Admission: I  have personally reviewed all the labs and imaging below    Basic Metabolic Panel: Recent Labs  Lab 01/01/20 1158  NA 142  K 3.6  CL 103  CO2 28  GLUCOSE 134*  BUN 29*  CREATININE 1.75*  CALCIUM 8.7*   Liver Function Tests: Recent Labs  Lab 01/01/20 1158  AST 22  ALT 36  ALKPHOS 65  BILITOT 0.5  PROT 6.9  ALBUMIN 3.2*   No results for input(s): LIPASE, AMYLASE in the last 168 hours. No results for input(s): AMMONIA in the last 168 hours. CBC: Recent Labs  Lab 12/31/19 1515 12/31/19 1515 01/01/20 1158  WBC 11.3*  --  13.3*  NEUTROABS  --   --  9.4*  HGB 7.0 Repeated and verified X2.*  --  7.1*  HCT 22.2 Repeated and verified X2.*  --  24.6*  MCV 81.5   < > 88.8  PLT 456.0*  --  459*   < > = values in this interval not displayed.   Cardiac Enzymes: No results for input(s): CKTOTAL, CKMB, CKMBINDEX, TROPONINI in the last 168 hours. BNP: Invalid input(s): POCBNP CBG: No results for input(s): GLUCAP in the last 168 hours.  Radiological Exams on Admission:  No results found.    EKG: Not done in the ED, will check   A & P   Principal Problem:   Symptomatic anemia Active Problems:   Diabetes mellitus without complication (HCC)   Coronary artery disease   CKD (chronic kidney disease), stage III (HCC)   GI bleed   Osteomyelitis ([place])   1. Symptomatic anemia likely secondary to GI bleed and iron deficiency anemia in the setting of aspirin and Plavix for CAD a. Hb 7.1 today with dyspnea on exertion and chest pain on exertion b. GI on board and recommending IV iron, holding Plavix but continue aspirin, EGD and colonoscopy and continue with 2 unit PRBC transfusion c. Follow-up Hb after blood transfusion  2. Left ankle subacute osteomyelitis  a. Follows with Dr. Sharol Given, Ortho b. Continue home Bactrim c. WOCN  3. CAD s/p DES (08/07/2018) a. Currently on DAPT b. Check an EKG c. Hold Plavix and continue aspirin as above d. Can likely just continue  antiplatelet monotherapy at discharge as we are over a year since stenting  4. CKD 3b, below baseline  5. Diabetes a. Check HbA1c b. Sliding scale  6. Pulmonary sarcoidosis a. Continue home prednisone   DVT prophylaxis: SCDs   Code Status: Full Code  Diet:  n.p.o. after midnight Family Communication: Admission, patients condition and plan of care including tests being ordered have been discussed with the patient who indicates understanding and agrees with the plan and Code Status.  Disposition Plan: The appropriate patient status for this patient is INPATIENT. Inpatient status is judged to be reasonable and necessary in order to provide the required intensity of service to ensure the patient's safety. The patient's presenting symptoms, physical exam findings, and initial radiographic and laboratory data in the context of their chronic comorbidities is felt to place them  at high risk for further clinical deterioration. Furthermore, it is not anticipated that the patient will be medically stable for discharge from the hospital within 2 midnights of admission. The following factors support the patient status of inpatient.   " The patient's presenting symptoms include dyspnea on exertion and chest pain with exertion. " The worrisome physical exam findings include none. " The initial radiographic and laboratory data are worrisome because of anemia. " The chronic co-morbidities include anemia, chronic GI bleeding.   * I certify that at the point of admission it is my clinical judgment that the patient will require inpatient hospital care spanning beyond 2 midnights from the point of admission due to high intensity of service, high risk for further deterioration and high frequency of surveillance required.*   Status is: Inpatient  Remains inpatient appropriate because:IV treatments appropriate due to intensity of illness or inability to take PO and Inpatient level of care appropriate due to  severity of illness   Dispo: The patient is from: Home              Anticipated d/c is to: Home              Anticipated d/c date is: 3 days              Patient currently is not medically stable to d/c.        Consultants  . GI  Procedures  . 2 u PRBCs  Time Spent on Admission: 65 minutes    Harold Hedge, DO [facility]  01/01/2020, 6:12 PM

## 2020-01-01 NOTE — ED Triage Notes (Signed)
Pt ambulates to triage following concerns for low Hgb. Had hgb of 7 yesterday, 10/26. States it was 7.5 the day before. Endorses SOB and fatigue. Speaks in full sentences in triage, but breathing is labored. Endorses foot ulcer and "cracked ribs" from 9/20. Scheduled for a colonoscopy next week to r/o GI bleed.

## 2020-01-01 NOTE — Progress Notes (Signed)
Office Visit Note   Patient: [name]           Date of Birth: [birth year]           MRN: [id number] Visit Date: 12/31/2019              Requested by: Shirline Frees, MD [address],  [place] [address] PCP: Shirline Frees, MD  Chief Complaint  Patient presents with  . Left Foot - Follow-up, Wound Check      HPI: Patient is a 74 year old gentleman who presents in follow-up for ulceration plantar aspect left foot with a Charcot rocker-bottom deformity.  Patient feels like he is doing better still has drainage.  Patient has had his orthotic modified to unload pressure from the rocker-bottom deformity patient is on sulfamethoxazole trimethoprim.  Assessment & Plan: Visit Diagnoses:  1. Charcot's joint, left ankle and foot   2. Subacute osteomyelitis, left ankle and foot ([place])     Plan: Discussed with the patient that the ulcer does probe to bone we will continue with the oral antibiotics continue with dressing changes.  Discussed that surgical intervention is most likely with the exposed bone.  Follow-Up Instructions: Return in about 1 week (around 01/07/2020).   Ortho Exam  Patient is alert, oriented, no adenopathy, well-dressed, normal affect, normal respiratory effort. Examination patient has a good pulse the orthotic has been cut out to unload pressure from the midfoot.  The rocker-bottom deformity has no cellulitis there is serosanguineous drainage the ulcer is 5 mm in diameter and with a Q-tip probes 2 cm deep to bone.  No recent hemoglobin A1c is this year.  Hemoglobin A1c last year was 10.2.  Imaging: No results found. No images are attached to the encounter.  Labs: Lab Results  Component Value Date   HGBA1C 10.2 (H) 10/04/2018   HGBA1C (H) 08/15/2008    12.2 (NOTE) The ADA recommends the following therapeutic goal for glycemic control related to Hgb A1c measurement: Goal of therapy: <6.5 Hgb A1c  Reference: American Diabetes Association:  Clinical Practice Recommendations 2010, Diabetes Care, 2010, 33: (Suppl  1).   HGBA1C (H) 11/27/2007    9.5 (NOTE)   The ADA recommends the following therapeutic goal for glycemic   control related to Hgb A1C measurement:   Goal of Therapy:   < 7.0% Hgb A1C   Reference: American Diabetes Association: Clinical Practice   Recommendations 2008, Diabetes Care,  2008, 31:(Suppl 1).   LABURIC 7.2 10/30/2018   REPTSTATUS 11/10/2010 FINAL 11/09/2010   CULT NO GROWTH 11/09/2010     Lab Results  Component Value Date   ALBUMIN 3.2 (L) 01/01/2020   ALBUMIN 3.5 11/13/2019   ALBUMIN 2.9 (L) 10/07/2018   PREALBUMIN 21.9 10/04/2018   LABURIC 7.2 10/30/2018    Lab Results  Component Value Date   MG 1.7 10/06/2018   MG 1.6 (L) 10/04/2018   MG 1.8 10/03/2018   No results found for: VD25OH  Lab Results  Component Value Date   PREALBUMIN 21.9 10/04/2018   CBC EXTENDED Latest Ref Rng & Units 01/01/2020 12/31/2019 12/30/2019  WBC 4.0 - 10.5 K/uL 13.3(H) 11.3(H) 13.1(H)  RBC 4.22 - 5.81 MIL/uL 2.77(L) 2.72(L) 2.97(L)  HGB 13.0 - 17.0 g/dL 7.1(L) 7.0 Repeated and verified X2.(LL) 7.5 Repeated and verified X2.(LL)  HCT 39 - 52 % 24.6(L) 22.2 Repeated and verified X2.(LL) 24.2(L)  PLT 150 - 400 K/uL 459(H) 456.0(H) 457.0(H)  NEUTROABS 1.7 - 7.7 K/uL 9.4(H) -  10.0(H)  LYMPHSABS 0.7 - 4.0 K/uL 2.3 - 1.9     There is no height or weight on file to calculate BMI.  Orders:  No orders of the defined types were placed in this encounter.  No orders of the defined types were placed in this encounter.    Procedures: No procedures performed  Clinical Data: No additional findings.  ROS:  All other systems negative, except as noted in the HPI. Review of Systems  Objective: Vital Signs: There were no vitals taken for this visit.  Specialty Comments:  No specialty comments available.  PMFS History: Patient Active Problem List   Diagnosis Date Noted  . GI bleed 01/01/2020  . Charcot's  joint, left ankle and foot 11/06/2019  . Peripheral arterial disease ([place]) 07/12/2019  . Pain in right elbow 10/30/2018  . Acute kidney injury superimposed on chronic kidney disease ([place]) 10/04/2018  . AKI (acute kidney injury) ([place]) 10/03/2018  . Hypoalbuminemia 10/03/2018  . Hypokalemia 10/03/2018  . Hypothyroidism 10/03/2018  . CKD (chronic kidney disease), stage III ([place])   . Iron deficiency anemia   . Symptomatic anemia 06/29/2016  . Chronic diastolic heart failure ([place]) 01/14/2014  . Stable angina ([place]) 01/14/2014  . Obesity 01/14/2014  . Pulmonary sarcoidosis ([place]) 01/14/2014  . Uncontrolled diabetes mellitus ([place]) 01/14/2014  . Hyperlipidemia 01/14/2014  . Coronary atherosclerosis of native coronary artery   . Sinusitis, chronic 05/30/2011  . Sleep apnea 10/09/2008  . ADRENAL INSUFFICIENCY, HX OF 02/21/2008  . Coronary artery disease 11/20/2007  . Sarcoidosis 02/20/2007  . Diabetes mellitus without complication ([place]) 54/62/7035  . GERD 12/20/2006   Past Medical History:  Diagnosis Date  . Adrenal insufficiency ([place])   . Allergy   . Anemia   . Arthritis    feet   . Broken foot 09/2019   had to wore a boot. Left   . CKD (chronic kidney disease), stage III ([place])   . Coronary artery disease    has stents  . Coronary atherosclerosis of native coronary artery    Proximal LAD, posterior lateral stent widely patent-10/12/11  . Diabetes mellitus    insulin and pills  . Hearing loss    wears hearing aids  . Heart attack ([place]) 2010  . History of blood transfusion 06/30/2016   [facility] - 2 units transfused  . Hypertension   . OSA (obstructive sleep apnea)    uses VPAC sleep study 2 years done through [facility]. Dr. Marlou Porch arranged study  . Pneumonia    3-4 years ago  . Sarcoid   . Sarcoidosis   . Sleep apnea    uses CPAP nightly  . Thyroid disease   . Type 2 diabetes mellitus (HCC)     Family History  Problem Relation Age of Onset  . Kidney disease Mother   .  Diabetes Mother   . Kidney cancer Mother   . Heart attack Father   . Asthma Sister   . Anesthesia problems Sister        "Kidney's did not wake up"  . Sarcoidosis Sister   . Sarcoidosis Niece   . Colon cancer Neg Hx   . Colon polyps Neg Hx   . Rectal cancer Neg Hx   . Stomach cancer Neg Hx     Past Surgical History:  Procedure Laterality Date  . APPENDECTOMY    . CATARACT EXTRACTION  2011   bilat  . CHOLECYSTECTOMY  01/25/2011   Procedure: LAPAROSCOPIC CHOLECYSTECTOMY WITH INTRAOPERATIVE CHOLANGIOGRAM;  Surgeon:  Judieth Keens, DO;  Location: [facility];  Service: General;  Laterality: N/A;  . COLONOSCOPY     several  . CORONARY ANGIOPLASTY     most recent 11/2009  . CORONARY STENT INTERVENTION N/A 08/07/2018   Procedure: CORONARY STENT INTERVENTION;  Surgeon: Jettie Booze, MD;  Location: [place] [facility];  Service: Cardiovascular;  Laterality: N/A;  . CORONARY STENT PLACEMENT  2009   in LAD and side branch PTCA  . INTERCOSTAL NERVE BLOCK  2011, 06/2016   x2. lumbar spine  . LEFT HEART CATHETERIZATION WITH CORONARY ANGIOGRAM Bilateral 10/12/2011   Procedure: LEFT HEART CATHETERIZATION WITH CORONARY ANGIOGRAM;  Surgeon: Candee Furbish, MD;  Location: [facility] CATH LAB;  Service: Cardiovascular;  Laterality: Bilateral;  . LEFT HEART CATHETERIZATION WITH CORONARY ANGIOGRAM N/A 04/01/2014   Procedure: LEFT HEART CATHETERIZATION WITH CORONARY ANGIOGRAM;  Surgeon: Candee Furbish, MD;  Location: [facility] CATH LAB;  Service: Cardiovascular;  Laterality: N/A;  . RIGHT/LEFT HEART CATH AND CORONARY ANGIOGRAPHY N/A 08/07/2018   Procedure: RIGHT/LEFT HEART CATH AND CORONARY ANGIOGRAPHY;  Surgeon: Jettie Booze, MD;  Location: [place] CV LAB;  Service: Cardiovascular;  Laterality: N/A;   Social History   Occupational History  . 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 Smoking status: Never Smoker  . Smokeless  tobacco: Never Used  Vaping Use  . Vaping Use: Never used  Substance and Sexual Activity  . Alcohol use: No  . Drug use: No  . Sexual activity: Not on file

## 2020-01-01 NOTE — Plan of Care (Signed)
GI BLEED PLAN

## 2020-01-02 DIAGNOSIS — D509 Iron deficiency anemia, unspecified: Secondary | ICD-10-CM

## 2020-01-02 LAB — TYPE AND SCREEN
ABO/RH(D): A POS
Antibody Screen: NEGATIVE
Unit division: 0
Unit division: 0

## 2020-01-02 LAB — CBC
HCT: 29.4 % — ABNORMAL LOW (ref 39.0–52.0)
Hemoglobin: 8.7 g/dL — ABNORMAL LOW (ref 13.0–17.0)
MCH: 26.4 pg (ref 26.0–34.0)
MCHC: 29.6 g/dL — ABNORMAL LOW (ref 30.0–36.0)
MCV: 89.1 fL (ref 80.0–100.0)
Platelets: 404 10*3/uL — ABNORMAL HIGH (ref 150–400)
RBC: 3.3 MIL/uL — ABNORMAL LOW (ref 4.22–5.81)
RDW: 16.9 % — ABNORMAL HIGH (ref 11.5–15.5)
WBC: 11.2 10*3/uL — ABNORMAL HIGH (ref 4.0–10.5)
nRBC: 0 % (ref 0.0–0.2)

## 2020-01-02 LAB — BASIC METABOLIC PANEL
Anion gap: 9 (ref 5–15)
BUN: 21 mg/dL (ref 8–23)
CO2: 25 mmol/L (ref 22–32)
Calcium: 8.3 mg/dL — ABNORMAL LOW (ref 8.9–10.3)
Chloride: 105 mmol/L (ref 98–111)
Creatinine, Ser: 1.71 mg/dL — ABNORMAL HIGH (ref 0.61–1.24)
GFR, Estimated: 41 mL/min — ABNORMAL LOW (ref >60)
Glucose, Bld: 98 mg/dL (ref 70–99)
Potassium: 4 mmol/L (ref 3.5–5.1)
Sodium: 139 mmol/L (ref 135–145)

## 2020-01-02 LAB — BPAM RBC
Blood Product Expiration Date: 202111192359
Blood Product Expiration Date: 202111192359
ISSUE DATE / TIME: 202110271357
ISSUE DATE / TIME: 202110271654
Unit Type and Rh: 6200
Unit Type and Rh: 6200

## 2020-01-02 LAB — GLUCOSE, CAPILLARY
Glucose-Capillary: 110 mg/dL — ABNORMAL HIGH (ref 70–99)
Glucose-Capillary: 114 mg/dL — ABNORMAL HIGH (ref 70–99)
Glucose-Capillary: 252 mg/dL — ABNORMAL HIGH (ref 70–99)
Glucose-Capillary: 310 mg/dL — ABNORMAL HIGH (ref 70–99)

## 2020-01-02 LAB — HEMOGLOBIN AND HEMATOCRIT, BLOOD
HCT: 28.9 % — ABNORMAL LOW (ref 39.0–52.0)
Hemoglobin: 8.8 g/dL — ABNORMAL LOW (ref 13.0–17.0)

## 2020-01-02 MED ORDER — PEG-KCL-NACL-NASULF-NA ASC-C 100 G PO SOLR: 1.0000 | Freq: Once | ORAL | Status: DC

## 2020-01-02 MED ORDER — PROSOURCE PLUS PO LIQD
30.0000 mL | Freq: Two times a day (BID) | ORAL | Status: DC
  Administered 2020-01-02: 30 mL via ORAL

## 2020-01-02 MED ORDER — ENSURE ENLIVE PO LIQD: 237.0000 mL | ORAL | Status: DC

## 2020-01-02 MED ORDER — PEG-KCL-NACL-NASULF-NA ASC-C 100 G PO SOLR
0.5000 | Freq: Once | ORAL | Status: AC
  Administered 2020-01-02: 100 g via ORAL
  Filled 2020-01-02: qty 1

## 2020-01-02 MED ORDER — DEXTROSE-NACL 5-0.9 % IV SOLN: INTRAVENOUS | Status: AC

## 2020-01-02 MED ORDER — ONDANSETRON HCL 4 MG/2ML IJ SOLN: 4.0000 mg | Freq: Four times a day (QID) | INTRAMUSCULAR | Status: DC | PRN

## 2020-01-02 NOTE — Plan of Care (Signed)

## 2020-01-02 NOTE — Progress Notes (Signed)
PROGRESS NOTE    [name]  GEX:[id number] DOB: 07-08-1945 DOA: 01/01/2020 PCP: Shirline Frees, MD    Brief Narrative:  74 year old male with history of Addison's anemia, coronary artery disease status post stents, sarcoidosis on chronic prednisone therapy, left ankle subacute osteomyelitis currently on Bactrim, type 2 diabetes admitted with abnormal lab work and inpatient procedure from GI office.  Baseline hemoglobin about 9.  Repeat hemoglobins found to be 7.  Patient also complaining of being weak and deconditioned.   Assessment & Plan:   Principal Problem:   Symptomatic anemia Active Problems:   Diabetes mellitus without complication (HCC)   Coronary artery disease   CKD (chronic kidney disease), stage III (HCC)   GI bleed   Osteomyelitis (HCC)  Symptomatic anemia: Chronic multifactorial pneumonia. Hemoglobin 7-2 units PRBC transfusion-8.7.  No active bleeding.  Blood pressure stable. 1 unit of iron dextran today, will repeat tomorrow. EGD and colonoscopy planned for tomorrow, patient will need extended prep because of difficulty preparation outpatient. We will continue to monitor.  Recheck hemoglobin tomorrow morning.  Left ankle subacute osteomyelitis: Wound care.  Continue Bactrim.  Coronary artery disease status post stent, 08/2018.  On dual antiplatelet therapy.  Plavix on hold for procedure.  Continue aspirin.  Currently without any chest pain.  CKD stage IIIb: At about his baseline.  Will monitor.  Pulmonary sarcoidosis: On chronic prednisone therapy will continue.   DVT prophylaxis: SCDs Start: 01/01/20 1625   Code Status: Full code Family Communication: Wife at the bedside Disposition Plan: Status is: Inpatient  Remains inpatient appropriate because:Ongoing diagnostic testing needed not appropriate for outpatient work up   Dispo: The patient is from: Home              Anticipated d/c is to: Home              Anticipated d/c date is: 2 days               Patient currently is not medically stable to d/c.         Consultants:   Gastroenterology  Procedures:   None  Antimicrobials:   Patient on chronic Bactrim therapy   Subjective: Patient seen and examined.  Wife was at the bedside.  Patient himself denies any complaints today.  He has not mobilized. He has been taking bowel prep and already started having some loose stools.  He is looking forward for procedure tomorrow morning.  Objective: Vitals:   01/01/20 1712 01/01/20 1758 01/01/20 2026 01/02/20 0553  BP: 132/65 (!) 149/69 131/70 131/64  Pulse: 63 64 62 64  Resp: _0 (!) 22  Temp: 97.9 F (36.6 C) 97.9 F (36.6 C) 98.2 F (36.8 C) 98.5 F (36.9 C)  TempSrc: Oral Oral Oral Oral  SpO2: 99% 100% 99% 95%  Weight:      Height:        Intake/Output Summary (Last 24 hours) at 01/02/2020 1114 Last data filed at 01/02/2020 0600 Gross per 24 hour  Intake 1549.49 ml  Output 150 ml  Net 1399.49 ml   Filed Weights   01/01/20 1154  Weight: 105.2 kg    Examination:  General exam: Appears calm and comfortable, not in any distress. Respiratory system: Clear to auscultation. Respiratory effort normal. Cardiovascular system: S1 & S2 heard, RRR. No JVD, murmurs, rubs, gallops or clicks. No pedal edema. Gastrointestinal system: Abdomen is nondistended, soft and nontender. No organomegaly or masses felt. Normal bowel sounds heard. Central nervous system: Alert  and oriented. No focal neurological deficits. Extremities: Symmetric 5 x 5 power. Patient has a chronic wound on his left leg, will be seen by wound care. Skin: No rashes, lesions or ulcers Psychiatry: Judgement and insight appear normal. Mood & affect appropriate.  Left leg with generalized edema, chronic full-thickness wound on the plantar aspect.   Data Reviewed: I have personally reviewed following labs and imaging studies  CBC: Recent Labs  Lab 12/30/19 1502 12/31/19 1515 01/01/20 1158  01/01/20 2213 01/02/20 0336  WBC 13.1* 11.3* 13.3*  --  11.2*  NEUTROABS 10.0*  --  9.4*  --   --   HGB 7.5 Repeated and verified X2.* 7.0 Repeated and verified X2.* 7.1* 8.6* 8.7*  8.8*  HCT 24.2* 22.2 Repeated and verified X2.* 24.6* 28.2* 29.4*  28.9*  MCV 81.5 81.5 88.8  --  89.1  PLT 457.0* 456.0* 459*  --  322*   Basic Metabolic Panel: Recent Labs  Lab 01/01/20 1158 01/02/20 0336  NA 142 139  K 3.6 4.0  CL 103 105  CO2 28 25  GLUCOSE 134* 98  BUN 29* 21  CREATININE 1.75* 1.71*  CALCIUM 8.7* 8.3*   GFR: Estimated Creatinine Clearance: 46 mL/min (A) (by C-G formula based on SCr of 1.71 mg/dL (H)). Liver Function Tests: Recent Labs  Lab 01/01/20 1158  AST 22  ALT 36  ALKPHOS 65  BILITOT 0.5  PROT 6.9  ALBUMIN 3.2*   No results for input(s): LIPASE, AMYLASE in the last 168 hours. No results for input(s): AMMONIA in the last 168 hours. Coagulation Profile: No results for input(s): INR, PROTIME in the last 168 hours. Cardiac Enzymes: No results for input(s): CKTOTAL, CKMB, CKMBINDEX, TROPONINI in the last 168 hours. BNP (last 3 results) No results for input(s): PROBNP in the last 8760 hours. HbA1C: No results for input(s): HGBA1C in the last 72 hours. CBG: Recent Labs  Lab 01/01/20 2212 01/01/20 2258 01/02/20 0752  GLUCAP 66* 110* 110*   Lipid Profile: No results for input(s): CHOL, HDL, LDLCALC, TRIG, CHOLHDL, LDLDIRECT in the last 72 hours. Thyroid Function Tests: No results for input(s): TSH, T4TOTAL, FREET4, T3FREE, THYROIDAB in the last 72 hours. Anemia Panel: Recent Labs    12/30/19 1502  FERRITIN 16*  TIBC 326  IRON 18*  17*   Sepsis Labs: No results for input(s): PROCALCITON, LATICACIDVEN in the last 168 hours.  Recent Results (from the past 240 hour(s))  Respiratory Panel by RT PCR (Flu A&B, Covid) - Nasopharyngeal Swab     Status: None   Collection Time: 01/01/20  4:09 PM   Specimen: Nasopharyngeal Swab  Result Value Ref Range  Status   SARS Coronavirus 2 by RT PCR NEGATIVE NEGATIVE Final    Comment: (NOTE) SARS-CoV-2 target nucleic acids are NOT DETECTED.  The SARS-CoV-2 RNA is generally detectable in upper respiratoy specimens during the acute phase of infection. The lowest concentration of SARS-CoV-2 viral copies this assay can detect is 131 copies/mL. A negative result does not preclude SARS-Cov-2 infection and should not be used as the sole basis for treatment or other patient management decisions. A negative result may occur with  improper specimen collection/handling, submission of specimen other than nasopharyngeal swab, presence of viral mutation(s) within the areas targeted by this assay, and inadequate number of viral copies (<131 copies/mL). A negative result must be combined with clinical observations, patient history, and epidemiological information. The expected result is Negative.  Fact Sheet for Patients:  PinkCheek.be  Fact Sheet for  Healthcare Providers:  GravelBags.it  This test is no t yet approved or cleared by the [place] and  has been authorized for detection and/or diagnosis of SARS-CoV-2 by FDA under an Emergency Use Authorization (EUA). This EUA will remain  in effect (meaning this test can be used) for the duration of the COVID-19 declaration under Section 564(b)(1) of the Act, 21 U.S.C. section 360bbb-3(b)(1), unless the authorization is terminated or revoked sooner.     Influenza A by PCR NEGATIVE NEGATIVE Final   Influenza B by PCR NEGATIVE NEGATIVE Final    Comment: (NOTE) The Xpert Xpress SARS-CoV-2/FLU/RSV assay is intended as an aid in  the diagnosis of influenza from Nasopharyngeal swab specimens and  should not be used as a sole basis for treatment. Nasal washings and  aspirates are unacceptable for Xpert Xpress SARS-CoV-2/FLU/RSV  testing.  Fact Sheet for  Patients: PinkCheek.be  Fact Sheet for Healthcare Providers: GravelBags.it  This test is not yet approved or cleared by the [place] FDA and  has been authorized for detection and/or diagnosis of SARS-CoV-2 by  FDA under an Emergency Use Authorization (EUA). This EUA will remain  in effect (meaning this test can be used) for the duration of the  Covid-19 declaration under Section 564(b)(1) of the Act, 21  U.S.C. section 360bbb-3(b)(1), unless the authorization is  terminated or revoked. Performed at [facility], [place] [address], [place], [place] [address]          Radiology Studies: No results found.      Scheduled Meds: . (feeding supplement) PROSource Plus  30 mL Oral BID BM  . allopurinol  100 mg Oral BID  . aspirin EC  81 mg Oral QPM  . brimonidine  1 drop Both Eyes Daily  . [START ON 01/03/2020] feeding supplement  237 mL Oral Q24H  . furosemide  80 mg Oral Daily  . influenza vaccine adjuvanted  0.5 mL Intramuscular Tomorrow-1000  . insulin aspart  0-9 Units Subcutaneous TID WC  . isosorbide mononitrate  30 mg Oral Daily  . levothyroxine  50 mcg Oral Daily  . losartan  50 mg Oral Daily  . pantoprazole  40 mg Oral Daily  . peg 3350 powder  0.5 kit Oral Once   And  . peg 3350 powder  0.5 kit Oral Once  . predniSONE  10 mg Oral Q breakfast  . rosuvastatin  10 mg Oral QHS  . sulfamethoxazole-trimethoprim  1 tablet Oral BID   Continuous Infusions:   LOS: 1 day    Time spent: 30 minutes    Barb Merino, MD [facility] Pager [phone]

## 2020-01-02 NOTE — Telephone Encounter (Signed)
Appt has been cancelled.  

## 2020-01-02 NOTE — Progress Notes (Addendum)
Patient ID: [name], male   DOB: 08-26-1945, 74 y.o.   MRN: [id number]    Progress Note   Subjective  Day # 2   CC; recurrent iron deficiency anemia, heme positive stool in setting of Plavix and aspirin  BUN 21/creatinine 1.71 Hemoglobin 8.7 stable post transfusions  Received iron dextran yesterday Feels okay, patient and wife both napping in room. Patient was able to take the Dulcolax and mag citrate and says he had multiple liquid bowel movements throughout the night, still "dirty water"    Objective   Vital signs in last 24 hours: Temp:  [97.9 F (36.6 C)-98.5 F (36.9 C)] 98.5 F (36.9 C) (10/28 0553) Pulse Rate:  [58-69] 64 (10/28 0553) Resp:  [13-24] 22 (10/28 0553) BP: (125-149)/(56-75) 131/64 (10/28 0553) SpO2:  [95 %-100 %] 95 % (10/28 0553) Weight:  [105.2 kg] 105.2 kg (10/27 1154) Last BM Date: 12/31/19 General:    white male in NAD Heart:  Regular rate and rhythm; no murmurs Lungs: Respirations even and unlabored, lungs CTA bilaterally Abdomen:  Soft, nontender and nondistended. Normal bowel sounds. Extremities:  Without edema. Neurologic:  Alert and oriented,  grossly normal neurologically. Psych:  Cooperative. Normal mood and affect.   Lab Results: Recent Labs    12/31/19 1515 12/31/19 1515 01/01/20 1158 01/01/20 2213 01/02/20 0336  WBC 11.3*  --  13.3*  --  11.2*  HGB 7.0 Repeated and verified X2.*   < > 7.1* 8.6* 8.7*  8.8*  HCT 22.2 Repeated and verified X2.*   < > 24.6* 28.2* 29.4*  28.9*  PLT 456.0*  --  459*  --  404*   < > = values in this interval not displayed.   BMET Recent Labs    01/01/20 1158 01/02/20 0336  NA 142 139  K 3.6 4.0  CL 103 105  CO2 28 25  GLUCOSE 134* 98  BUN 29* 21  CREATININE 1.75* 1.71*  CALCIUM 8.7* 8.3*   LFT Recent Labs    01/01/20 1158  PROT 6.9  ALBUMIN 3.2*  AST 22  ALT 36  ALKPHOS 65  BILITOT 0.5        Assessment / Plan:    #45  73 year old white male with recurrent iron  deficiency anemia , and heme positive stool in setting of chronic aspirin and Plavix  Patient has been off Plavix over the past 4 days Received iron dextran yesterday Hemoglobin stable post transfusions  Patient started prep last evening with Dulcolax and mag citrate and had good results.  He is agreeable to [facility] prep today. Patient scheduled for EGD and colonoscopy tomorrow with Dr. Carlean Purl at noon.  #2 coronary artery disease status post stents-we will need to resume Plavix post procedures  #3 history of colon polyps-last colonoscopy May 2018, 115 mm polyp removed, ulcerated.  Biopsies consistent with inflammatory polyp no adenomatous change or malignancy. #4 sarcoidosis 5.  Sleep apnea with CPAP use 6.  Adult onset diabetes mellitus   Plan; continue to trend hemoglobin Continue to hold Plavix until post endoscopic procedures EGD and colonoscopy 01/03/2020, Movi prep today Capsule endoscopy if EGD and colonoscopy unrevealing     LOS: 1 day   Amy Esterwood PA-C 01/02/2020, 9:02 AM  I have also seen the patient.  I agree with the assessment and plan evaluation above.  Colonoscopy and upper endoscopy tomorrow.  Gatha Mayer, MD, [facility] 01/02/2020 6:06 PM

## 2020-01-02 NOTE — Progress Notes (Signed)
Initial Nutrition Assessment  RD working remotely.  DOCUMENTATION CODES:   Obesity unspecified  INTERVENTION:  - continue Ensure Enlive but will decrease from BID to once/day, each supplement provides 350 kcal and 20 grams of protein. - will order 30 ml Prosource Plus BID, each supplement provides 100 kcal and 15 grams protein.  - will complete NFPE at follow-up.   NUTRITION DIAGNOSIS:   Inadequate protein intake related to other (see comment) (current diet order) as evidenced by other (comment) (CLD does not meet estimated protein needs.).  GOAL:   Patient will meet greater than or equal to 90% of their needs  MONITOR:   PO intake, Supplement acceptance, Diet advancement, Labs, Weight trends  REASON FOR ASSESSMENT:   Malnutrition Screening Tool  ASSESSMENT:   74 year old male with medical history of iron deficiency anemia, CAD s/p stent, OSA, sarcoidosis, L ankle subacute osteomyelitis (currently on Bactrim), and type 2 DM. He presented to the ED for abnormal lab work for anemia, low Hgb. He has a colonoscopy scheduled on 11/4. It had been recommended in the past that he receive iron infusions but he has been hesitant due to cost.  Diet advanced from CLD to Carb Modified yesterday at 1625, changed to [place] at 1805, made NPO at midnight, and then re-advanced to CLD today at 0500. No intakes documented from yesterday or earlier today.   MST score of 2.0 indicating no issues with appetite PTA and that he is unsure about any recent weight loss.   Weight yesterday was 232 lb and weight on 07/12/19 was 236 lb. This indicates 4 lb weight loss (2% body weight) in the past 5.5 months.  Per notes: - symptomatic anemia thought to be 2/2 GIB and iron deficiency anemia in the setting of aspirin and plavix use - L ankle subacute osteomyelitis  - patient is from home and likely plan at the time of d/c is for him to return home   Labs reviewed; CBG: 110 mg/dl, creatinine: 1.71 mg/dl, Ca: 8.3  mg/dl, GFR: 41 ml/min.  Medications reviewed; 80 mg oral lasix/day, sliding scale novolog, 50 mcg oral synthroid/day, 10 mg IV reglan x1 dose 10/27, 40 mg oral protonix/day, 10 mg deltasone/day.     NUTRITION - FOCUSED PHYSICAL EXAM:  unable to complete at this time.   Diet Order:   Diet Order            Diet clear liquid Room service appropriate? Yes; Fluid consistency: Thin  Diet effective 0500                 EDUCATION NEEDS:   No education needs have been identified at this time  Skin:  Skin Assessment: Skin Integrity Issues: Skin Integrity Issues:: Other (Comment) Other: non-pressure injury to L foot  Last BM:  PTA/unknown  Height:   Ht Readings from Last 1 Encounters:  01/01/20 5\' 10"  (1.778 m)    Weight:   Wt Readings from Last 1 Encounters:  01/01/20 105.2 kg     Estimated Nutritional Needs:  Kcal:  1700-1900 kcal Protein:  80-90 grams Fluid:  >/= 2 L/day      Jarome Matin, MS, RD, LDN, CNSC Inpatient Clinical Dietitian RD pager # available in [facility]  After hours/weekend pager # available in [facility]

## 2020-01-02 NOTE — Consult Note (Addendum)
[facility] Nurse Consult Note: Reason for Consult: Consult requested for left foot wound.  Pt has a chronic full thickness wound with osteomyelitis and is followed as an outpatient by Dr Sharol Given of the ortho service.  He recently had an appointment on 10/26.   Wound type: Left foot with generalized edema. Left plantar foot with chronic full thickness wound; 2X1X.5cm; 50% red, 50% yellow, mod amt yellow drainage, no odor, erythremia surrounding to 2 cm. Dressing procedure/placement/frequency: Topical treatment orders provided for bedside nurses to perform as follows: Continue present plan of care as ordered by Dr Sharol Given- Cleanse left foot wound Q day with NS, then apply 4X4s and kerlex and acewrap.  Float heels to reduce pressure.  Pt should resume follow-up with the ortho service after discharge.  Please re-consult if further assistance is needed.  Thank-you,  Julien Girt MSN, [place], [place], [place], [place]

## 2020-01-02 NOTE — H&P (View-Only) (Signed)
Patient ID: [name], male   DOB: 10/30/1945, 74 y.o.   MRN: [id number]    Progress Note   Subjective  Day # 2   CC; recurrent iron deficiency anemia, heme positive stool in setting of Plavix and aspirin  BUN 21/creatinine 1.71 Hemoglobin 8.7 stable post transfusions  Received iron dextran yesterday Feels okay, patient and wife both napping in room. Patient was able to take the Dulcolax and mag citrate and says he had multiple liquid bowel movements throughout the night, still "dirty water"    Objective   Vital signs in last 24 hours: Temp:  [97.9 F (36.6 C)-98.5 F (36.9 C)] 98.5 F (36.9 C) (10/28 0553) Pulse Rate:  [58-69] 64 (10/28 0553) Resp:  [13-24] 22 (10/28 0553) BP: (125-149)/(56-75) 131/64 (10/28 0553) SpO2:  [95 %-100 %] 95 % (10/28 0553) Weight:  [105.2 kg] 105.2 kg (10/27 1154) Last BM Date: 12/31/19 General:    white male in NAD Heart:  Regular rate and rhythm; no murmurs Lungs: Respirations even and unlabored, lungs CTA bilaterally Abdomen:  Soft, nontender and nondistended. Normal bowel sounds. Extremities:  Without edema. Neurologic:  Alert and oriented,  grossly normal neurologically. Psych:  Cooperative. Normal mood and affect.   Lab Results: Recent Labs    12/31/19 1515 12/31/19 1515 01/01/20 1158 01/01/20 2213 01/02/20 0336  WBC 11.3*  --  13.3*  --  11.2*  HGB 7.0 Repeated and verified X2.*   < > 7.1* 8.6* 8.7*  8.8*  HCT 22.2 Repeated and verified X2.*   < > 24.6* 28.2* 29.4*  28.9*  PLT 456.0*  --  459*  --  404*   < > = values in this interval not displayed.   BMET Recent Labs    01/01/20 1158 01/02/20 0336  NA 142 139  K 3.6 4.0  CL 103 105  CO2 28 25  GLUCOSE 134* 98  BUN 29* 21  CREATININE 1.75* 1.71*  CALCIUM 8.7* 8.3*   LFT Recent Labs    01/01/20 1158  PROT 6.9  ALBUMIN 3.2*  AST 22  ALT 36  ALKPHOS 65  BILITOT 0.5        Assessment / Plan:    #60  74 year old white male with recurrent iron  deficiency anemia , and heme positive stool in setting of chronic aspirin and Plavix  Patient has been off Plavix over the past 4 days Received iron dextran yesterday Hemoglobin stable post transfusions  Patient started prep last evening with Dulcolax and mag citrate and had good results.  He is agreeable to [facility] prep today. Patient scheduled for EGD and colonoscopy tomorrow with Dr. Carlean Purl at noon.  #2 coronary artery disease status post stents-we will need to resume Plavix post procedures  #3 history of colon polyps-last colonoscopy May 2018, 115 mm polyp removed, ulcerated.  Biopsies consistent with inflammatory polyp no adenomatous change or malignancy. #4 sarcoidosis 5.  Sleep apnea with CPAP use 6.  Adult onset diabetes mellitus   Plan; continue to trend hemoglobin Continue to hold Plavix until post endoscopic procedures EGD and colonoscopy 01/03/2020, Movi prep today Capsule endoscopy if EGD and colonoscopy unrevealing     LOS: 1 day   Amy Esterwood PA-C 01/02/2020, 9:02 AM  I have also seen the patient.  I agree with the assessment and plan evaluation above.  Colonoscopy and upper endoscopy tomorrow.  Gatha Mayer, MD, [facility] 01/02/2020 6:06 PM

## 2020-01-02 NOTE — TOC Progression Note (Signed)
Transition of Care [facility]) - Progression Note    Patient Details  Name: [name] MRN: [id number] Date of Birth: 09/14/1945  [facility] of [facility] CM/SW Contact  Purcell Mouton, RN Phone Number: 01/02/2020, 12:22 PM  Clinical Narrative:    Pt from home with spouse. TOC will continue to follow.    Expected Discharge Plan: Home/Self Care Barriers to Discharge: No Barriers Identified  Expected Discharge Plan and Services Expected Discharge Plan: Home/Self Care       Living arrangements for the past 2 months: Single Family Home                                       Social Determinants of Health (SDOH) Interventions    Readmission Risk Interventions No flowsheet data found.

## 2020-01-02 NOTE — Progress Notes (Signed)
On-call practitioner M. Sharlet Salina was notified of pt CBG and treatment. She was also notified of the pt post transfusion HGB.

## 2020-01-02 NOTE — Telephone Encounter (Signed)
CG, thank you for update.  I see that procedures are being worked on as an inpatient currently. Patty, please move forward with canceling the procedure scheduled for 11/4. Thanks. GM

## 2020-01-03 ENCOUNTER — Encounter (HOSPITAL_COMMUNITY)
Admission: EM | Disposition: A | Discharge status: Home / Self Care | Payer: Self-pay | Source: Home / Self Care | Attending: Internal Medicine

## 2020-01-03 ENCOUNTER — Encounter (HOSPITAL_COMMUNITY): Payer: Self-pay | Admitting: Internal Medicine

## 2020-01-03 ENCOUNTER — Inpatient Hospital Stay (HOSPITAL_COMMUNITY): Payer: Medicare HMO | Admitting: Anesthesiology

## 2020-01-03 DIAGNOSIS — K922 Gastrointestinal hemorrhage, unspecified: Secondary | ICD-10-CM

## 2020-01-03 DIAGNOSIS — K317 Polyp of stomach and duodenum: Secondary | ICD-10-CM

## 2020-01-03 HISTORY — PX: POLYPECTOMY: SHX5525

## 2020-01-03 HISTORY — PX: COLONOSCOPY WITH PROPOFOL: SHX5780

## 2020-01-03 HISTORY — PX: ESOPHAGOGASTRODUODENOSCOPY (EGD) WITH PROPOFOL: SHX5813

## 2020-01-03 HISTORY — PX: HEMOSTASIS CLIP PLACEMENT: SHX6857

## 2020-01-03 LAB — GLUCOSE, CAPILLARY
Glucose-Capillary: 146 mg/dL — ABNORMAL HIGH (ref 70–99)
Glucose-Capillary: 158 mg/dL — ABNORMAL HIGH (ref 70–99)
Glucose-Capillary: 196 mg/dL — ABNORMAL HIGH (ref 70–99)

## 2020-01-03 LAB — HEMOGLOBIN AND HEMATOCRIT, BLOOD
HCT: 30.8 % — ABNORMAL LOW (ref 39.0–52.0)
Hemoglobin: 9.2 g/dL — ABNORMAL LOW (ref 13.0–17.0)

## 2020-01-03 LAB — HEMOGLOBIN A1C
Hgb A1c MFr Bld: 9.5 % — ABNORMAL HIGH (ref 4.8–5.6)
Mean Plasma Glucose: 226 mg/dL

## 2020-01-03 SURGERY — ESOPHAGOGASTRODUODENOSCOPY (EGD) WITH PROPOFOL
Anesthesia: Monitor Anesthesia Care

## 2020-01-03 MED ORDER — INFLUENZA VAC A&B SA ADJ QUAD 0.5 ML IM PRSY
0.5000 mL | PREFILLED_SYRINGE | Freq: Once | INTRAMUSCULAR | Status: AC
  Administered 2020-01-03: 0.5 mL via INTRAMUSCULAR
  Filled 2020-01-03: qty 0.5

## 2020-01-03 MED ORDER — INSULIN ASPART 100 UNIT/ML ~~LOC~~ SOLN
0.0000 [IU] | SUBCUTANEOUS | Status: DC
  Administered 2020-01-03: 2 [IU] via SUBCUTANEOUS

## 2020-01-03 MED ORDER — LACTATED RINGERS IV SOLN: INTRAVENOUS | Status: DC

## 2020-01-03 MED ORDER — LIDOCAINE HCL (CARDIAC) PF 100 MG/5ML IV SOSY
PREFILLED_SYRINGE | INTRAVENOUS | Status: DC | PRN
  Administered 2020-01-03: 40 mg via INTRAVENOUS
  Administered 2020-01-03: 60 mg via INTRAVENOUS

## 2020-01-03 MED ORDER — PROPOFOL 10 MG/ML IV BOLUS
INTRAVENOUS | Status: DC | PRN
  Administered 2020-01-03 (×2): 30 mg via INTRAVENOUS
  Administered 2020-01-03: 40 mg via INTRAVENOUS

## 2020-01-03 MED ORDER — PROPOFOL 10 MG/ML IV BOLUS
INTRAVENOUS | Status: AC
  Filled 2020-01-03: qty 20

## 2020-01-03 MED ORDER — SODIUM CHLORIDE 0.9 % IV SOLN: INTRAVENOUS | Status: DC

## 2020-01-03 MED ORDER — PROPOFOL 500 MG/50ML IV EMUL
INTRAVENOUS | Status: DC | PRN
  Administered 2020-01-03: 150 ug/kg/min via INTRAVENOUS

## 2020-01-03 SURGICAL SUPPLY — 25 items

## 2020-01-03 NOTE — Anesthesia Procedure Notes (Signed)
Procedure Name: MAC Date/Time: 01/03/2020 11:30 AM Performed by: Lieutenant Diego, CRNA Pre-anesthesia Checklist: Patient identified, Emergency Drugs available, Suction available, Patient being monitored and Timeout performed Patient Re-evaluated:Patient Re-evaluated prior to induction Oxygen Delivery Method: Simple face mask Preoxygenation: Pre-oxygenation with 100% oxygen Induction Type: IV induction

## 2020-01-03 NOTE — Care Management Important Message (Signed)
Important Message  Patient Details IM Letter given to the Patient Name: [name] MRN: [id number] Date of Birth: 18-Jan-1946   Medicare Important Message Given:  Yes     Kerin Salen 01/03/2020, 12:14 PM

## 2020-01-03 NOTE — Brief Op Note (Addendum)
01/01/2020 - 01/03/2020  12:15 PM  PATIENT:  [name]  74 y.o. male  PRE-OPERATIVE DIAGNOSIS:  Iron deficiency anemia  POST-OPERATIVE DIAGNOSIS:  gastric polyps /  PROCEDURE:  Procedure(s): ESOPHAGOGASTRODUODENOSCOPY (EGD) WITH PROPOFOL (N/A) COLONOSCOPY WITH PROPOFOL (N/A) POLYPECTOMY HEMOSTASIS CLIP PLACEMENT  SURGEON:  Surgeon(s) and Role:    * Gatha Mayer, MD - Primary   ANESTHESIA:   MAC  EGD  Small amount of fresh blood in duodenal bulb but no bleeding site  2 small prepyloric polyps snared and removed - about 6-7 mm and suspect have been bleeding and think maybe blood in bulb from these sites clipped x 2 each  Otherwise NL   COLON   Difficult insertion - aided by abdominal binder and manual pressure Excellent prep  Tattoo in transverse colon - prior polypectomy scar  Otherwise NL  At this point suspect he has been losing blood from polyps  Full reports to follow  Plan  1) DC today 2) resume clopidogral on 01/06/2020 - other meds same including stay on daily PPI 3) I will follow-up path and Hgb, iron levels, etc 4) I called and reviewed the plan with his wife

## 2020-01-03 NOTE — Anesthesia Postprocedure Evaluation (Signed)
Anesthesia Post Note  Patient: [name]  Procedure(s) Performed: ESOPHAGOGASTRODUODENOSCOPY (EGD) WITH PROPOFOL (N/A ) COLONOSCOPY WITH PROPOFOL (N/A ) POLYPECTOMY HEMOSTASIS CLIP PLACEMENT     Patient location during evaluation: Endoscopy Anesthesia Type: MAC Level of consciousness: awake and alert Pain management: pain level controlled Vital Signs Assessment: post-procedure vital signs reviewed and stable Respiratory status: spontaneous breathing, nonlabored ventilation, respiratory function stable and patient connected to nasal cannula oxygen Cardiovascular status: stable and blood pressure returned to baseline Postop Assessment: no apparent nausea or vomiting Anesthetic complications: no   No complications documented.  Last Vitals:  Vitals:   01/03/20 1230 01/03/20 1310  BP: (!) 147/60 137/64  Pulse: 70 61  Resp: 16 18  Temp:  36.6 C  SpO2: 98% 94%    Last Pain:  Vitals:   01/03/20 1310  TempSrc: Oral  PainSc:                  March Rummage Samentha Perham

## 2020-01-03 NOTE — Transfer of Care (Signed)
Immediate Anesthesia Transfer of Care Note  Patient: [name]  Procedure(s) Performed: ESOPHAGOGASTRODUODENOSCOPY (EGD) WITH PROPOFOL (N/A ) COLONOSCOPY WITH PROPOFOL (N/A ) POLYPECTOMY HEMOSTASIS CLIP PLACEMENT  Patient Location: Endoscopy Unit  Anesthesia Type:MAC  Level of Consciousness: awake  Airway & Oxygen Therapy: Patient Spontanous Breathing and Patient connected to face mask oxygen  Post-op Assessment: Report given to RN and Post -op Vital signs reviewed and stable  Post vital signs: Reviewed and stable  Last Vitals:  Vitals Value Taken Time  BP 129/50 01/03/20 1220  Temp 36.9 C 01/03/20 1220  Pulse 66 01/03/20 1228  Resp 22 01/03/20 1228  SpO2 97 % 01/03/20 1228  Vitals shown include unvalidated device data.  Last Pain:  Vitals:   01/03/20 1220  TempSrc: Axillary  PainSc: 0-No pain      Patients Stated Pain Goal: 3 (12/45/80 9983)  Complications: No complications documented.

## 2020-01-03 NOTE — Discharge Summary (Signed)
Physician Discharge Summary  [name] VHQ:[id number] DOB: 1945-07-07 DOA: 01/01/2020  PCP: Shirline Frees, MD  Admit date: 01/01/2020 Discharge date: 01/03/2020  Admitted From: Home Disposition: Home  Recommendations for Outpatient Follow-up:  1. Follow up with PCP in 1-2 weeks 2. Please obtain BMP/CBC in one week 3. Follow-up with GI as a scheduled  Home Health: Not applicable Equipment/Devices: Not needed  Discharge Condition: Stable CODE STATUS: Full code Diet recommendation: Low-carb low-salt diet  Discharge summary: 74 year old gentleman with history of chronic anemia, coronary artery disease, sarcoidosis on chronic prednisone therapy, left ankle subacute osteomyelitis on Bactrim, type 2 diabetes admitted to the hospital with abnormal lab work for inpatient gi procedure. His baseline Hb is 9, found to have Hb of 7. Received 2 unit of prbc and 1 unit of iv iron. On aspirin and plavix at home . Colon prep after stopping plavix .  Patient underwent EGD and colonoscopy after appropriate blood transfusion and hemoglobin responded to 9. EGD showed a small amount of fresh blood in the duodenal bulb no bleeding site.  2 small prepyloric polyps snared and removed.  Otherwise normal. Colonoscopy showed no active bleeding.  Plan: Appropriately responded to blood transfusion.  May benefit with intermittent iron transfusion. Going home.  On maintenance PPI. We will continue aspirin.  He will resume Plavix on 01/06/2020. Stable for discharge with outpatient follow-up. Resumed all other longstanding medications.     Discharge Diagnoses:  Principal Problem:   Symptomatic anemia Active Problems:   Diabetes mellitus without complication ([place])   Coronary artery disease   CKD (chronic kidney disease), stage III ([place])   GI bleed   Osteomyelitis ([place])   Gastric polyps    Discharge Instructions  Discharge Instructions    Diet - low sodium heart healthy   Complete by: As  directed    Diet Carb Modified   Complete by: As directed    Discharge instructions   Complete by: As directed    Resume taking Plavix on 01/06/2020   Discharge wound care:   Complete by: As directed    Cleanse left foot wound Q day with NS, then apply 4X4s and kerlex and acewrap   Increase activity slowly   Complete by: As directed      Allergies as of 01/03/2020      Reactions   Statins Other (See Comments)   Pt says they make him "mean"   Hydrocortisone Nausea Only   Miralax [polyethylene Glycol] Nausea And Vomiting   Patient refuses to take- reminded me today of this. ta   Metolazone Other (See Comments)   Drained, no energy      Medication List    STOP taking these medications   allopurinol 100 MG tablet Commonly known as: ZYLOPRIM   Colchicine 0.6 MG Caps   isosorbide mononitrate 30 MG 24 hr tablet Commonly known as: IMDUR   polyethylene glycol powder 17 GM/SCOOP powder Commonly known as: GLYCOLAX/MIRALAX     TAKE these medications   acetaminophen 500 MG tablet Commonly known as: TYLENOL Take 500 mg by mouth daily as needed for moderate pain or headache.   Albuterol Sulfate 108 (90 Base) MCG/ACT Aepb Commonly known as: ProAir RespiClick Inhale 2 puffs into the lungs every 6 (six) hours as needed. What changed: reasons to take this   albuterol (2.5 MG/3ML) 0.083% nebulizer solution Commonly known as: PROVENTIL Take 3 mLs (2.5 mg total) by nebulization every 6 (six) hours as needed for wheezing or shortness of breath. Dx Code  D86.0 What changed: Another medication with the same name was changed. Make sure you understand how and when to take each.   aspirin EC 81 MG tablet Take 81 mg by mouth every evening.   brimonidine 0.2 % ophthalmic solution Commonly known as: ALPHAGAN Place 1 drop into the left eye daily.   clopidogrel 75 MG tablet Commonly known as: PLAVIX Take 1 tablet (75 mg total) by mouth daily.   CoQ-10 400 MG Caps Take 400 mg by mouth  every evening.   Droplet Insulin Syringe 31G X 5/16" 1 ML Misc Generic drug: Insulin Syringe-Needle U-100   fish oil-omega-3 fatty acids 1000 MG capsule Take 1 g by mouth daily.   furosemide 80 MG tablet Commonly known as: LASIX Take 1 tablet (80 mg total) by mouth daily.   glipiZIDE 10 MG tablet Commonly known as: GLUCOTROL Take 10 mg by mouth every evening.   insulin aspart protamine- aspart (70-30) 100 UNIT/ML injection Commonly known as: NOVOLOG MIX 70/30 Inject 0.3 mLs (30 Units total) into the skin 2 (two) times daily with a meal.   levothyroxine 50 MCG tablet Commonly known as: SYNTHROID Take 50 mcg by mouth daily.   losartan 100 MG tablet Commonly known as: COZAAR Take 100 mg by mouth daily. What changed: Another medication with the same name was removed. Continue taking this medication, and follow the directions you see here.   montelukast 10 MG tablet Commonly known as: SINGULAIR Take 10 mg by mouth daily as needed (allergies).   multivitamin with minerals Tabs tablet Take 1 tablet by mouth daily.   pantoprazole 40 MG tablet Commonly known as: PROTONIX Take 40 mg by mouth daily.   predniSONE 10 MG tablet Commonly known as: DELTASONE Take 1 tablet (10 mg total) by mouth daily with breakfast. T What changed: additional instructions   RABEprazole 20 MG tablet Commonly known as: ACIPHEX Take 20 mg by mouth daily as needed (acid reflux).   rosuvastatin 10 MG tablet Commonly known as: CRESTOR TAKE 1 TABLET AT BEDTIME What changed:   when to take this  additional instructions   sulfamethoxazole-trimethoprim 800-160 MG tablet Commonly known as: Bactrim DS Take 1 tablet by mouth 2 (two) times daily. What changed: additional instructions   traMADol 50 MG tablet Commonly known as: ULTRAM Take 50 mg by mouth every 6 (six) hours as needed for moderate pain.   Vitamin D (Ergocalciferol) 1.25 MG (50000 UNIT) Caps capsule Commonly known as: DRISDOL Take  50,000 Units by mouth every Sunday.   Vitamin D3 75 MCG (3000 UT) Tabs Take 3,000 Units by mouth daily.            Discharge Care Instructions  (From admission, onward)         Start     Ordered   01/03/20 0000  Discharge wound care:       Comments: Cleanse left foot wound Q day with NS, then apply 4X4s and kerlex and acewrap   01/03/20 1321          Allergies  Allergen Reactions  . Statins Other (See Comments)    Pt says they make him "mean"  . Hydrocortisone Nausea Only  . Miralax [Polyethylene Glycol] Nausea And Vomiting    Patient refuses to take- reminded me today of this. ta  . Metolazone Other (See Comments)    Drained, no energy    Consultations:  Gastroenterology   Procedures/Studies: No results found. (Echo, Carotid, EGD, Colonoscopy, ERCP)    Subjective: Patient seen  and examined in the morning rounds.  Came back from procedure and eager to go home.  Denies any complaints.  Wife at the bedside.   Discharge Exam: Vitals:   01/03/20 1230 01/03/20 1310  BP: (!) 147/60 137/64  Pulse: 70 61  Resp: 16 18  Temp:  97.8 F (36.6 C)  SpO2: 98% 94%   Vitals:   01/03/20 1039 01/03/20 1220 01/03/20 1230 01/03/20 1310  BP: (!) 144/49 (!) 129/50 (!) 147/60 137/64  Pulse: 63 (!) 58 70 61  Resp: 10 17 16 18   Temp: 98.3 F (36.8 C) 98.5 F (36.9 C)  97.8 F (36.6 C)  TempSrc: Oral Axillary  Oral  SpO2: 93% 100% 98% 94%  Weight: 105 kg     Height: 5\' 10"  (1.778 m)       General: Pt is alert, awake, not in acute distress Cardiovascular: RRR, S1/S2 +, no rubs, no gallops Respiratory: CTA bilaterally, no wheezing, no rhonchi Abdominal: Soft, NT, ND, bowel sounds + Extremities: no edema, no cyanosis Chronic wound left planter aspect , dressing on    The results of significant diagnostics from this hospitalization (including imaging, microbiology, ancillary and laboratory) are listed below for reference.     Microbiology: Recent Results (from  the past 240 hour(s))  Respiratory Panel by RT PCR (Flu A&B, Covid) - Nasopharyngeal Swab     Status: None   Collection Time: 01/01/20  4:09 PM   Specimen: Nasopharyngeal Swab  Result Value Ref Range Status   SARS Coronavirus 2 by RT PCR NEGATIVE NEGATIVE Final    Comment: (NOTE) SARS-CoV-2 target nucleic acids are NOT DETECTED.  The SARS-CoV-2 RNA is generally detectable in upper respiratoy specimens during the acute phase of infection. The lowest concentration of SARS-CoV-2 viral copies this assay can detect is 131 copies/mL. A negative result does not preclude SARS-Cov-2 infection and should not be used as the sole basis for treatment or other patient management decisions. A negative result may occur with  improper specimen collection/handling, submission of specimen other than nasopharyngeal swab, presence of viral mutation(s) within the areas targeted by this assay, and inadequate number of viral copies (<131 copies/mL). A negative result must be combined with clinical observations, patient history, and epidemiological information. The expected result is Negative.  Fact Sheet for Patients:  PinkCheek.be  Fact Sheet for Healthcare Providers:  GravelBags.it  This test is no t yet approved or cleared by the [place] FDA and  has been authorized for detection and/or diagnosis of SARS-CoV-2 by FDA under an Emergency Use Authorization (EUA). This EUA will remain  in effect (meaning this test can be used) for the duration of the COVID-19 declaration under Section 564(b)(1) of the Act, 21 U.S.C. section 360bbb-3(b)(1), unless the authorization is terminated or revoked sooner.     Influenza A by PCR NEGATIVE NEGATIVE Final   Influenza B by PCR NEGATIVE NEGATIVE Final    Comment: (NOTE) The Xpert Xpress SARS-CoV-2/FLU/RSV assay is intended as an aid in  the diagnosis of influenza from Nasopharyngeal swab specimens and   should not be used as a sole basis for treatment. Nasal washings and  aspirates are unacceptable for Xpert Xpress SARS-CoV-2/FLU/RSV  testing.  Fact Sheet for Patients: PinkCheek.be  Fact Sheet for Healthcare Providers: GravelBags.it  This test is not yet approved or cleared by the [place] FDA and  has been authorized for detection and/or diagnosis of SARS-CoV-2 by  FDA under an Emergency Use Authorization (EUA). This EUA will remain  in effect (meaning this test can be used) for the duration of the  Covid-19 declaration under Section 564(b)(1) of the Act, 21  U.S.C. section 360bbb-3(b)(1), unless the authorization is  terminated or revoked. Performed at [facility], [place] [address] [place], [place] [address]      Labs: BNP (last 3 results) No results for input(s): BNP in the last 8760 hours. Basic Metabolic Panel: Recent Labs  Lab 01/01/20 1158 01/02/20 0336  NA 142 139  K 3.6 4.0  CL 103 105  CO2 28 25  GLUCOSE 134* 98  BUN 29* 21  CREATININE 1.75* 1.71*  CALCIUM 8.7* 8.3*   Liver Function Tests: Recent Labs  Lab 01/01/20 1158  AST 22  ALT 36  ALKPHOS 65  BILITOT 0.5  PROT 6.9  ALBUMIN 3.2*   No results for input(s): LIPASE, AMYLASE in the last 168 hours. No results for input(s): AMMONIA in the last 168 hours. CBC: Recent Labs  Lab 12/30/19 1502 12/30/19 1502 12/31/19 1515 01/01/20 1158 01/01/20 2213 01/02/20 0336 01/03/20 0402  WBC 13.1*  --  11.3* 13.3*  --  11.2*  --   NEUTROABS 10.0*  --   --  9.4*  --   --   --   HGB 7.5 Repeated and verified X2.*   < > 7.0 Repeated and verified X2.* 7.1* 8.6* 8.7*  8.8* 9.2*  HCT 24.2*   < > 22.2 Repeated and verified X2.* 24.6* 28.2* 29.4*  28.9* 30.8*  MCV 81.5  --  81.5 88.8  --  89.1  --   PLT 457.0*  --  456.0* 459*  --  404*  --    < > = values in this interval not displayed.   Cardiac Enzymes: No results for  input(s): CKTOTAL, CKMB, CKMBINDEX, TROPONINI in the last 168 hours. BNP: Invalid input(s): POCBNP CBG: Recent Labs  Lab 01/02/20 1702 01/02/20 2155 01/03/20 0745 01/03/20 1051 01/03/20 1305  GLUCAP 252* 310* 146* 158* 196*   D-Dimer No results for input(s): DDIMER in the last 72 hours. Hgb A1c Recent Labs    01/01/20 1158  HGBA1C 9.5*   Lipid Profile No results for input(s): CHOL, HDL, LDLCALC, TRIG, CHOLHDL, LDLDIRECT in the last 72 hours. Thyroid function studies No results for input(s): TSH, T4TOTAL, T3FREE, THYROIDAB in the last 72 hours.  Invalid input(s): FREET3 Anemia work up No results for input(s): VITAMINB12, FOLATE, FERRITIN, TIBC, IRON, RETICCTPCT in the last 72 hours. Urinalysis    Component Value Date/Time   COLORURINE YELLOW 10/06/2018 1324   APPEARANCEUR CLEAR 10/06/2018 1324   LABSPEC 1.010 10/06/2018 1324   PHURINE 6.0 10/06/2018 1324   GLUCOSEU >=500 (A) 10/06/2018 1324   HGBUR NEGATIVE 10/06/2018 1324   BILIRUBINUR NEGATIVE 10/06/2018 1324   KETONESUR NEGATIVE 10/06/2018 1324   PROTEINUR NEGATIVE 10/06/2018 1324   UROBILINOGEN 0.2 11/09/2010 2146   NITRITE NEGATIVE 10/06/2018 1324   LEUKOCYTESUR NEGATIVE 10/06/2018 1324   Sepsis Labs Invalid input(s): PROCALCITONIN,  WBC,  LACTICIDVEN Microbiology Recent Results (from the past 240 hour(s))  Respiratory Panel by RT PCR (Flu A&B, Covid) - Nasopharyngeal Swab     Status: None   Collection Time: 01/01/20  4:09 PM   Specimen: Nasopharyngeal Swab  Result Value Ref Range Status   SARS Coronavirus 2 by RT PCR NEGATIVE NEGATIVE Final    Comment: (NOTE) SARS-CoV-2 target nucleic acids are NOT DETECTED.  The SARS-CoV-2 RNA is generally detectable in upper respiratoy specimens during the acute phase of infection. The  lowest concentration of SARS-CoV-2 viral copies this assay can detect is 131 copies/mL. A negative result does not preclude SARS-Cov-2 infection and should not be used as the sole  basis for treatment or other patient management decisions. A negative result may occur with  improper specimen collection/handling, submission of specimen other than nasopharyngeal swab, presence of viral mutation(s) within the areas targeted by this assay, and inadequate number of viral copies (<131 copies/mL). A negative result must be combined with clinical observations, patient history, and epidemiological information. The expected result is Negative.  Fact Sheet for Patients:  PinkCheek.be  Fact Sheet for Healthcare Providers:  GravelBags.it  This test is no t yet approved or cleared by the [place] FDA and  has been authorized for detection and/or diagnosis of SARS-CoV-2 by FDA under an Emergency Use Authorization (EUA). This EUA will remain  in effect (meaning this test can be used) for the duration of the COVID-19 declaration under Section 564(b)(1) of the Act, 21 U.S.C. section 360bbb-3(b)(1), unless the authorization is terminated or revoked sooner.     Influenza A by PCR NEGATIVE NEGATIVE Final   Influenza B by PCR NEGATIVE NEGATIVE Final    Comment: (NOTE) The Xpert Xpress SARS-CoV-2/FLU/RSV assay is intended as an aid in  the diagnosis of influenza from Nasopharyngeal swab specimens and  should not be used as a sole basis for treatment. Nasal washings and  aspirates are unacceptable for Xpert Xpress SARS-CoV-2/FLU/RSV  testing.  Fact Sheet for Patients: PinkCheek.be  Fact Sheet for Healthcare Providers: GravelBags.it  This test is not yet approved or cleared by the [place] FDA and  has been authorized for detection and/or diagnosis of SARS-CoV-2 by  FDA under an Emergency Use Authorization (EUA). This EUA will remain  in effect (meaning this test can be used) for the duration of the  Covid-19 declaration under Section 564(b)(1) of the  Act, 21  U.S.C. section 360bbb-3(b)(1), unless the authorization is  terminated or revoked. Performed at [facility], [place] [address] [place], [place] [address]      Time coordinating discharge:  32 minutes  SIGNED:   Barb Merino, MD  [facility] 01/03/2020, 4:04 PM

## 2020-01-03 NOTE — Progress Notes (Signed)
Inpatient Diabetes Program Recommendations  AACE/ADA: New Consensus Statement on Inpatient Glycemic Control (2015)  Target Ranges:  Prepandial:   less than 140 mg/dL      Peak postprandial:   less than 180 mg/dL (1-2 hours)      Critically ill patients:  140 - 180 mg/dL   Lab Results  Component Value Date   GLUCAP 146 (H) 01/03/2020   HGBA1C 9.5 (H) 01/01/2020    Review of Glycemic Control Results for [name], [name] (MRN [id number]) as of 01/03/2020 10:37  Ref. Range 01/02/2020 07:52 01/02/2020 12:30 01/02/2020 17:02 01/02/2020 21:55 01/03/2020 07:45  Glucose-Capillary Latest Ref Range: 70 - 99 mg/dL 110 (H) 114 (H) 252 (H) 310 (H) 146 (H)   Diabetes history: DM2 Outpatient Diabetes medications: 70/30 30 units bid, Glipizide 10 mg qhs Current orders for Inpatient glycemic control: Novolog correction 0-9 units tid   Inpatient Diabetes Program Recommendations:   Received order from Dr. Sloan Leiter to change Novolog correction to q 4 hrs while patient is NPO.  Thank you, Nani Gasser. Cathyann Kilfoyle, RN, MSN, CDE  Diabetes Coordinator Inpatient Glycemic Control Team Team Pager [phone] (8am-5pm) 01/03/2020 10:41 AM

## 2020-01-03 NOTE — Interval H&P Note (Signed)
History and Physical Interval Note:  01/03/2020 11:01 AM  [name]  has presented today for surgery, with the diagnosis of Iron deficiency anemia.  The various methods of treatment have been discussed with the patient and family. After consideration of risks, benefits and other options for treatment, the patient has consented to  Procedure(s): ESOPHAGOGASTRODUODENOSCOPY (EGD) WITH PROPOFOL (N/A) COLONOSCOPY WITH PROPOFOL (N/A) as a surgical intervention.  The patient's history has been reviewed, patient examined, no change in status, stable for surgery.  I have reviewed the patient's chart and labs.  Questions were answered to the patient's satisfaction.     Silvano Rusk

## 2020-01-03 NOTE — Anesthesia Preprocedure Evaluation (Addendum)
Anesthesia Evaluation  Patient identified by MRN, date of birth, ID band Patient awake    Reviewed: Patient's Chart, lab work & pertinent test results  Airway Mallampati: II  TM Distance: >3 FB Neck ROM: Full    Dental  (+) Teeth Intact   Pulmonary sleep apnea ,    Pulmonary exam normal        Cardiovascular hypertension, + CAD, + Past MI and + Peripheral Vascular Disease   Rhythm:Regular Rate:Normal     Neuro/Psych    GI/Hepatic Neg liver ROS, GERD  Medicated,  Endo/Other  diabetes, Well Controlled, Type 2Hypothyroidism   Renal/GU CRFRenal disease     Musculoskeletal  (+) Arthritis ,   Abdominal (+)  Abdomen: soft. Bowel sounds: normal.  Peds  Hematology  (+) anemia ,   Anesthesia Other Findings   Reproductive/Obstetrics                           Anesthesia Physical Anesthesia Plan  ASA: III  Anesthesia Plan: MAC   Post-op Pain Management:    Induction:   PONV Risk Score and Plan: 1 and Propofol infusion and Treatment may vary due to age or medical condition  Airway Management Planned: Simple Face Mask and Nasal Cannula  Additional Equipment: None  Intra-op Plan:   Post-operative Plan:   Informed Consent: I have reviewed the patients History and Physical, chart, labs and discussed the procedure including the risks, benefits and alternatives for the proposed anesthesia with the patient or authorized representative who has indicated his/her understanding and acceptance.     Dental advisory given  Plan Discussed with: CRNA  Anesthesia Plan Comments:         Anesthesia Quick Evaluation

## 2020-01-03 NOTE — Op Note (Signed)
[facility] Patient Name: [name] Procedure Date: 01/03/2020 MRN: [id number] Attending MD: Gatha Mayer , MD Date of Birth: 1945/07/06 CSN: [id number] Age: 74 Admit Type: Inpatient Procedure:                Upper GI endoscopy Indications:              Iron deficiency anemia Providers:                Gatha Mayer, MD, Erenest Rasher, RN, Cleda Daub, RN, Gean Quint RN, Cletis Athens,                            Technician Referring MD:              Medicines:                Propofol per Anesthesia, Monitored Anesthesia Care Complications:            No immediate complications. Estimated Blood Loss:     Estimated blood loss was minimal. Procedure:                Pre-Anesthesia Assessment:                           - Prior to the procedure, a History and Physical                            was performed, and patient medications and                            allergies were reviewed. The patient's tolerance of                            previous anesthesia was also reviewed. The risks                            and benefits of the procedure and the sedation                            options and risks were discussed with the patient.                            All questions were answered, and informed consent                            was obtained. Prior Anticoagulants: The patient                            last took Plavix (clopidogrel) 5 days prior to the                            procedure. ASA Grade Assessment: III - A patient  with severe systemic disease. After reviewing the                            risks and benefits, the patient was deemed in                            satisfactory condition to undergo the procedure.                           After obtaining informed consent, the endoscope was                            passed under direct vision. Throughout the                             procedure, the patient's blood pressure, pulse, and                            oxygen saturations were monitored continuously. The                            [id number]-H190 ([id number]) was introduced through the                            mouth, and advanced to the second part of duodenum.                            The upper GI endoscopy was accomplished without                            difficulty. The patient tolerated the procedure                            well. Scope In: Scope Out: Findings:      Two 6 to 7 mm sessile polyps with stigmata of recent bleeding were found       in the prepyloric region of the stomach. The polyp was removed with a       hot snare. Resection and retrieval were complete. Verification of       patient identification for the specimen was done. Estimated blood loss       was minimal. To prevent bleeding after the polypectomy, four hemostatic       clips were successfully placed (MR conditional).      Red blood was found in the duodenal bulb.      The exam was otherwise without abnormality.      The cardia and gastric fundus were normal on retroflexion. Impression:               - Two gastric polyps. Resected and retrieved. Clips                            (MR conditional) were placed.                           - Blood in the duodenal bulb.                           -  The examination was otherwise normal. Moderate Sedation:      Not Applicable - Patient had care per Anesthesia. Recommendation:           - See the other procedure note for documentation of                            additional recommendations. Will not resume                            clopidogrel until 11/1 can go home today I will                            contact with path and plans Procedure Code(s):        --- Professional ---                           [id number], Esophagogastroduodenoscopy, flexible,                            transoral; with removal of tumor(s), polyp(s), or                             other lesion(s) by snare technique Diagnosis Code(s):        --- Professional ---                           K31.7, Polyp of stomach and duodenum                           K92.2, Gastrointestinal hemorrhage, unspecified                           D50.9, Iron deficiency anemia, unspecified CPT copyright 2019 American Medical Association. All rights reserved. The codes documented in this report are preliminary and upon coder review may  be revised to meet current compliance requirements. Gatha Mayer, MD 01/03/2020 12:39:05 PM This report has been signed electronically. Number of Addenda: 0

## 2020-01-03 NOTE — Op Note (Signed)
[facility] Patient Name: [name] Procedure Date: 01/03/2020 MRN: [id number] Attending MD: Gatha Mayer , MD Date of Birth: [birth year] CSN: [id number] Age: 74 Admit Type: Inpatient Procedure:                Colonoscopy Indications:              Iron deficiency anemia Providers:                Gatha Mayer, MD, Erenest Rasher, RN, Cleda Daub, RN, Delena Serve RN, Cletis Athens, Technician Referring MD:              Medicines:                Propofol per Anesthesia, Monitored Anesthesia Care Complications:            No immediate complications. Estimated Blood Loss:     Estimated blood loss: none. Procedure:                Pre-Anesthesia Assessment:                           - Prior to the procedure, a History and Physical                            was performed, and patient medications and                            allergies were reviewed. The patient's tolerance of                            previous anesthesia was also reviewed. The risks                            and benefits of the procedure and the sedation                            options and risks were discussed with the patient.                            All questions were answered, and informed consent                            was obtained. Prior Anticoagulants: The patient                            last took Plavix (clopidogrel) 5 days prior to the                            procedure. ASA Grade Assessment: III - A patient                            with severe systemic disease. After reviewing the  risks and benefits, the patient was deemed in                            satisfactory condition to undergo the procedure.                           - Prior to the procedure, a History and Physical                            was performed, and patient medications and                            allergies were reviewed. The patient's tolerance of                             previous anesthesia was also reviewed. The risks                            and benefits of the procedure and the sedation                            options and risks were discussed with the patient.                            All questions were answered, and informed consent                            was obtained. Prior Anticoagulants: The patient                            last took Plavix (clopidogrel) 5 days prior to the                            procedure. ASA Grade Assessment: III - A patient                            with severe systemic disease. After reviewing the                            risks and benefits, the patient was deemed in                            satisfactory condition to undergo the procedure.                           After obtaining informed consent, the colonoscope                            was passed under direct vision. Throughout the                            procedure, the patient's blood pressure, pulse, and  oxygen saturations were monitored continuously. The                            [id number] ([id number]) Olympus colonoscope was                            introduced through the anus and advanced to the the                            cecum, identified by appendiceal orifice and                            ileocecal valve. The colonoscopy was somewhat                            difficult due to a redundant colon. Successful                            completion of the procedure was aided by applying                            abdominal pressure and abdominal binder XL. The                            patient tolerated the procedure well. The quality                            of the bowel preparation was excellent. The bowel                            preparation used was MoviPrep via split dose                            instruction. Dulcolax + Mg citrat day before also Scope In: 11:51:56 AM Scope Out:  12:11:16 PM Total Procedure Duration: 0 hours 19 minutes 20 seconds  Findings:      The perianal and digital rectal examinations were normal.      A tattoo was seen in the transverse colon. A post-polypectomy scar was       found at the tattoo site.      The exam was otherwise without abnormality on direct and retroflexion       views. Impression:               - A tattoo was seen in the transverse colon. A                            post-polypectomy scar was found at the tattoo site.                           - The examination was otherwise normal on direct                            and retroflexion views.                           -  No specimens collected. Moderate Sedation:      Not Applicable - Patient had care per Anesthesia. Recommendation:           - Return patient to hospital ward and home today -                            see EGD note.                           - No repeat colonoscopy. Procedure Code(s):        --- Professional ---                           [phone], Colonoscopy, flexible; diagnostic, including                            collection of specimen(s) by brushing or washing,                            when performed (separate procedure) Diagnosis Code(s):        --- Professional ---                           D50.9, Iron deficiency anemia, unspecified CPT copyright 2019 American Medical Association. All rights reserved. The codes documented in this report are preliminary and upon coder review may  be revised to meet current compliance requirements. Gatha Mayer, MD 01/03/2020 12:42:40 PM This report has been signed electronically. Number of Addenda: 0

## 2020-01-03 NOTE — Progress Notes (Signed)
AVS given to patient and explained at the bedside. Medications and follow up appointments have been explained with pt verbalizing understanding.  

## 2020-01-03 NOTE — Progress Notes (Signed)
Patient ID: [name], male   DOB: 03-27-1945, 74 y.o.   MRN: [id number]    Progress Note   Subjective   day # 2  CC;  Fe deficiency anemia/ heme+- on Plavix/ASA  hgb 9.2 post transfusions- stable  Patient and wife resting.  He was able to complete the Movi prep and says stool is completely clear liquid.  Hoping to go home this afternoon   Objective   Vital signs in last 24 hours: Temp:  [98 F (36.7 C)-98.3 F (36.8 C)] 98.3 F (36.8 C) (10/29 0601) Pulse Rate:  [61-69] 68 (10/29 0601) Resp:  [20] 20 (10/29 0601) BP: (131-144)/(57-67) 144/63 (10/29 0601) SpO2:  [94 %-97 %] 97 % (10/29 0601) Last BM Date: 12/31/19 General:   Older WM in NAD Heart:  Regular rate and rhythm; no murmurs Lungs: Respirations even and unlabored, lungs CTA bilaterally Abdomen:  Soft, nontender and nondistended. Normal bowel sounds. Extremities:  Without edema. Neurologic:  Alert and oriented,  grossly normal neurologically. Psych:  Cooperative. Normal mood and affect.  Intake/Output from previous day: 10/28 0701 - 10/29 0700 In: 960 [P.O.:960] Out: 1500 [Urine:1500] Intake/Output this shift: No intake/output data recorded.  Lab Results: Recent Labs    12/31/19 1515 12/31/19 1515 01/01/20 1158 01/01/20 1158 01/01/20 2213 01/02/20 0336 01/03/20 0402  WBC 11.3*  --  13.3*  --   --  11.2*  --   HGB 7.0 Repeated and verified X2.*   < > 7.1*   < > 8.6* 8.7*   8.8* 9.2*  HCT 22.2 Repeated and verified X2.*   < > 24.6*   < > 28.2* 29.4*   28.9* 30.8*  PLT 456.0*  --  459*  --   --  404*  --    < > = values in this interval not displayed.   BMET Recent Labs    01/01/20 1158 01/02/20 0336  NA 142 139  K 3.6 4.0  CL 103 105  CO2 28 25  GLUCOSE 134* 98  BUN 29* 21  CREATININE 1.75* 1.71*  CALCIUM 8.7* 8.3*   LFT Recent Labs    01/01/20 1158  PROT 6.9  ALBUMIN 3.2*  AST 22  ALT 36  ALKPHOS 65  BILITOT 0.5   PT/INR No results for input(s): LABPROT, INR in the last 72  hours.      Assessment / Plan:    #69 74 year old white male with recurrent iron deficiency anemia, and heme positive stool in setting of chronic aspirin and Plavix.  Hemoglobin stable since transfusion Status post iron dextran infusion  Etiology of chronic GI blood loss is not clear, rule out upper versus lower GI source, chronic gastropathy, AVMs, doubt neoplasm  #2 coronary artery disease status post stents-we will need to resume aspirin and Plavix post procedures #3 history of colon polyps-last colonoscopy 2018 #4 sarcoidosis 5.  Sleep apnea with CPAP #6 adult onset diabetes mellitus  Plan; EGD and colonoscopy today Hopefully patient can be discharged later today.  Discussed possible capsule endoscopy with him and he would prefer to do this outpatient if possible. Further recommendations pending findings at endoscopic evaluation. He will need repeat CBC next week.        Principal Problem:   Symptomatic anemia Active Problems:   Diabetes mellitus without complication (HCC)   Coronary artery disease   CKD (chronic kidney disease), stage III ([place])   GI bleed   Osteomyelitis ([place])     LOS: 2 days   [place]  [place] PA-C 01/03/2020, 8:52 AM

## 2020-01-05 ENCOUNTER — Encounter (HOSPITAL_COMMUNITY): Payer: Self-pay | Admitting: Internal Medicine

## 2020-01-06 LAB — SURGICAL PATHOLOGY

## 2020-01-07 ENCOUNTER — Encounter: Payer: Self-pay | Admitting: Orthopedic Surgery

## 2020-01-07 ENCOUNTER — Ambulatory Visit: Payer: Medicare HMO | Admitting: Orthopedic Surgery

## 2020-01-07 ENCOUNTER — Ambulatory Visit: Payer: Medicare HMO | Admitting: Physician Assistant

## 2020-01-07 DIAGNOSIS — E1165 Type 2 diabetes mellitus with hyperglycemia: Secondary | ICD-10-CM | POA: Diagnosis not present

## 2020-01-07 DIAGNOSIS — M14672 Charcot's joint, left ankle and foot: Secondary | ICD-10-CM

## 2020-01-07 DIAGNOSIS — M86272 Subacute osteomyelitis, left ankle and foot: Secondary | ICD-10-CM | POA: Diagnosis not present

## 2020-01-07 NOTE — Progress Notes (Signed)
Office Visit Note   Patient: [name]           Date of Birth: 08-Feb-1946           MRN: [id number] Visit Date: 01/07/2020              Requested by: Shirline Frees, MD [address],  [place] [address] PCP: Shirline Frees, MD  Chief Complaint  Patient presents with  . Left Ankle - Follow-up      HPI: Patient is a 74 year old gentleman with uncontrolled type 2 diabetes who presents in follow-up for his Charcot foot with draining ulcer exposed bone and osteomyelitis.  Patient states he has a history of sleep apnea uses CPAP has a history of sarcoidosis that he takes prednisone for.  Patient states that his hemoglobin recently was 7 he underwent endoscopy for a bleeding polyp he states that his hemoglobin is now 9.5 after transfusion and iron transfusion.  Patient states his hemoglobin A1c is greater than 10.  Patient states that he has been in hyperbaric oxygen therapy before for 2 months for infected toes.  Assessment & Plan: Visit Diagnoses:  1. Charcot's joint, left ankle and foot   2. Subacute osteomyelitis, left ankle and foot ([place])   3. Uncontrolled type 2 diabetes mellitus with hyperglycemia ([place])     Plan: Discussed with the patient and his wife the options at length the possibility of attempted foot salvage with excision of the ulcer excision of the infected bone placement of antibiotic beads wound closure and initiation of 6 weeks of IV antibiotic with a PICC line after bone cultures are obtained.  Discussed that foot salvage is a long shot.  Discussed that more predictable option would be a transtibial amputation.  All questions were encouraged and answered and patient states he will call us when he wants to decide on surgery.  Follow-Up Instructions: Return in about 1 week (around 01/14/2020).   Ortho Exam  Patient is alert, oriented, no adenopathy, well-dressed, normal affect, normal respiratory effort. Examination patient has a good dorsalis  pedis and posterior tibial pulse he has a stable Charcot foot there is no redness no cellulitis his wife states that the cellulitis has resolved there is drainage from the plantar ulcer which is 1 cm in diameter this probes 4 cm deep to an extensive area of infected bone.  Imaging: No results found. No images are attached to the encounter.  Labs: Lab Results  Component Value Date   HGBA1C 9.5 (H) 01/01/2020   HGBA1C 10.2 (H) 10/04/2018   HGBA1C (H) 08/15/2008    12.2 (NOTE) The ADA recommends the following therapeutic goal for glycemic control related to Hgb A1c measurement: Goal of therapy: <6.5 Hgb A1c  Reference: American Diabetes Association: Clinical Practice Recommendations 2010, Diabetes Care, 2010, 33: (Suppl  1).   LABURIC 7.2 10/30/2018   REPTSTATUS 11/10/2010 FINAL 11/09/2010   CULT NO GROWTH 11/09/2010     Lab Results  Component Value Date   ALBUMIN 3.2 (L) 01/01/2020   ALBUMIN 3.5 11/13/2019   ALBUMIN 2.9 (L) 10/07/2018   PREALBUMIN 21.9 10/04/2018   LABURIC 7.2 10/30/2018    Lab Results  Component Value Date   MG 1.7 10/06/2018   MG 1.6 (L) 10/04/2018   MG 1.8 10/03/2018   No results found for: VD25OH  Lab Results  Component Value Date   PREALBUMIN 21.9 10/04/2018   CBC EXTENDED Latest Ref Rng & Units 01/03/2020 01/02/2020 01/02/2020  WBC  4.0 - 10.5 K/uL - 11.2(H) -  RBC 4.22 - 5.81 MIL/uL - 3.30(L) -  HGB 13.0 - 17.0 g/dL 9.2(L) 8.7(L) 8.8(L)  HCT 39 - 52 % 30.8(L) 29.4(L) 28.9(L)  PLT 150 - 400 K/uL - 404(H) -  NEUTROABS 1.7 - 7.7 K/uL - - -  LYMPHSABS 0.7 - 4.0 K/uL - - -     There is no height or weight on file to calculate BMI.  Orders:  No orders of the defined types were placed in this encounter.  No orders of the defined types were placed in this encounter.    Procedures: No procedures performed  Clinical Data: No additional findings.  ROS:  All other systems negative, except as noted in the HPI. Review of  Systems  Objective: Vital Signs: There were no vitals taken for this visit.  Specialty Comments:  No specialty comments available.  PMFS History: Patient Active Problem List   Diagnosis Date Noted  . Gastric polyps   . GI bleed 01/01/2020  . Osteomyelitis ([place]) 01/01/2020  . Charcot's joint, left ankle and foot 11/06/2019  . Peripheral arterial disease ([place]) 07/12/2019  . Pain in right elbow 10/30/2018  . Acute kidney injury superimposed on chronic kidney disease ([place]) 10/04/2018  . AKI (acute kidney injury) ([place]) 10/03/2018  . Hypoalbuminemia 10/03/2018  . Hypokalemia 10/03/2018  . Hypothyroidism 10/03/2018  . CKD (chronic kidney disease), stage III ([place])   . Iron deficiency anemia   . Symptomatic anemia 06/29/2016  . Chronic diastolic heart failure ([place]) 01/14/2014  . Stable angina ([place]) 01/14/2014  . Obesity 01/14/2014  . Pulmonary sarcoidosis ([place]) 01/14/2014  . Uncontrolled diabetes mellitus ([place]) 01/14/2014  . Hyperlipidemia 01/14/2014  . Coronary atherosclerosis of native coronary artery   . Sinusitis, chronic 05/30/2011  . Sleep apnea 10/09/2008  . ADRENAL INSUFFICIENCY, HX OF 02/21/2008  . Coronary artery disease 11/20/2007  . Sarcoidosis 02/20/2007  . Diabetes mellitus without complication ([place]) 94/50/3888  . GERD 12/20/2006   Past Medical History:  Diagnosis Date  . Adrenal insufficiency ([place])   . Allergy   . Anemia   . Arthritis    feet   . Broken foot 09/2019   had to wore a boot. Left   . CKD (chronic kidney disease), stage III ([place])   . Coronary artery disease    has stents  . Coronary atherosclerosis of native coronary artery    Proximal LAD, posterior lateral stent widely patent-10/12/11  . Diabetes mellitus    insulin and pills  . Hearing loss    wears hearing aids  . Heart attack ([place]) 2010  . History of blood transfusion 06/30/2016   [facility] - 2 units transfused  . Hypertension   . OSA (obstructive sleep apnea)    uses VPAC sleep  study 2 years done through [facility]. Dr. Marlou Porch arranged study  . Pneumonia    3-4 years ago  . Sarcoid   . Sarcoidosis   . Sleep apnea    uses CPAP nightly  . Thyroid disease   . Type 2 diabetes mellitus (HCC)     Family History  Problem Relation Age of Onset  . Kidney disease Mother   . Diabetes Mother   . Kidney cancer Mother   . Heart attack Father   . Asthma Sister   . Anesthesia problems Sister        "Kidney's did not wake up"  . Sarcoidosis Sister   . Sarcoidosis Niece   . Colon cancer Neg Hx   .  Colon polyps Neg Hx   . Rectal cancer Neg Hx   . Stomach cancer Neg Hx     Past Surgical History:  Procedure Laterality Date  . APPENDECTOMY    . CATARACT EXTRACTION  2011   bilat  . CHOLECYSTECTOMY  01/25/2011   Procedure: LAPAROSCOPIC CHOLECYSTECTOMY WITH INTRAOPERATIVE CHOLANGIOGRAM;  Surgeon: Judieth Keens, DO;  Location: [facility];  Service: General;  Laterality: N/A;  . COLONOSCOPY     several  . COLONOSCOPY WITH PROPOFOL N/A 01/03/2020   Procedure: COLONOSCOPY WITH PROPOFOL;  Surgeon: Gatha Mayer, MD;  Location: [facility];  Service: Endoscopy;  Laterality: N/A;  . CORONARY ANGIOPLASTY     most recent 11/2009  . CORONARY STENT INTERVENTION N/A 08/07/2018   Procedure: CORONARY STENT INTERVENTION;  Surgeon: Jettie Booze, MD;  Location: [facility];  Service: Cardiovascular;  Laterality: N/A;  . CORONARY STENT PLACEMENT  2009   in LAD and side branch PTCA  . ESOPHAGOGASTRODUODENOSCOPY (EGD) WITH PROPOFOL N/A 01/03/2020   Procedure: ESOPHAGOGASTRODUODENOSCOPY (EGD) WITH PROPOFOL;  Surgeon: Gatha Mayer, MD;  Location: [facility];  Service: Endoscopy;  Laterality: N/A;  . HEMOSTASIS CLIP PLACEMENT  01/03/2020   Procedure: HEMOSTASIS CLIP PLACEMENT;  Surgeon: Gatha Mayer, MD;  Location: [facility];  Service: Endoscopy;;  . INTERCOSTAL NERVE BLOCK  2011, 06/2016   x2. lumbar spine  . LEFT HEART CATHETERIZATION WITH CORONARY ANGIOGRAM  Bilateral 10/12/2011   Procedure: LEFT HEART CATHETERIZATION WITH CORONARY ANGIOGRAM;  Surgeon: Candee Furbish, MD;  Location: [facility] C[facility] LAB;  Service: Cardiovascular;  Laterality: Bilateral;  . LEFT HEART CATHETERIZATION WITH CORONARY ANGIOGRAM N/A 04/01/2014   Procedure: LEFT HEART CATHETERIZATION WITH CORONARY ANGIOGRAM;  Surgeon: Candee Furbish, MD;  Location: [facility] CATH LAB;  Service: Cardiovascular;  Laterality: N/A;  . POLYPECTOMY  01/03/2020   Procedure: POLYPECTOMY;  Surgeon: Gatha Mayer, MD;  Location: [facility];  Service: Endoscopy;;  . RIGHT/LEFT HEART CATH AND CORONARY ANGIOGRAPHY N/A 08/07/2018   Procedure: RIGHT/LEFT HEART CATH AND CORONARY ANGIOGRAPHY;  Surgeon: Jettie Booze, MD;  Location: [facility];  Service: Cardiovascular;  Laterality: N/A;   Social History   Occupational History  . 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 Smoking status: Never Smoker  . Smokeless tobacco: Never Used  Vaping Use  . Vaping Use: Never used  Substance and Sexual Activity  . Alcohol use: No  . Drug use: No  . Sexual activity: Not on file

## 2020-01-08 DIAGNOSIS — E1169 Type 2 diabetes mellitus with other specified complication: Secondary | ICD-10-CM | POA: Diagnosis not present

## 2020-01-08 DIAGNOSIS — E11621 Type 2 diabetes mellitus with foot ulcer: Secondary | ICD-10-CM | POA: Diagnosis not present

## 2020-01-08 DIAGNOSIS — L97529 Non-pressure chronic ulcer of other part of left foot with unspecified severity: Secondary | ICD-10-CM | POA: Diagnosis not present

## 2020-01-08 DIAGNOSIS — D500 Iron deficiency anemia secondary to blood loss (chronic): Secondary | ICD-10-CM | POA: Diagnosis not present

## 2020-01-08 DIAGNOSIS — M86472 Chronic osteomyelitis with draining sinus, left ankle and foot: Secondary | ICD-10-CM | POA: Diagnosis not present

## 2020-01-08 DIAGNOSIS — Z794 Long term (current) use of insulin: Secondary | ICD-10-CM | POA: Diagnosis not present

## 2020-01-09 ENCOUNTER — Ambulatory Visit: Payer: Medicare HMO | Admitting: Infectious Disease

## 2020-01-09 ENCOUNTER — Encounter: Payer: Self-pay | Admitting: Infectious Disease

## 2020-01-09 ENCOUNTER — Other Ambulatory Visit: Payer: Self-pay

## 2020-01-09 ENCOUNTER — Encounter: Payer: Medicare HMO | Admitting: Gastroenterology

## 2020-01-09 VITALS — BP 179/64 | HR 85 | Temp 98.6°F | Wt 227.0 lb

## 2020-01-09 DIAGNOSIS — N1832 Chronic kidney disease, stage 3b: Secondary | ICD-10-CM

## 2020-01-09 DIAGNOSIS — E1165 Type 2 diabetes mellitus with hyperglycemia: Secondary | ICD-10-CM

## 2020-01-09 DIAGNOSIS — M14672 Charcot's joint, left ankle and foot: Secondary | ICD-10-CM

## 2020-01-09 DIAGNOSIS — I2510 Atherosclerotic heart disease of native coronary artery without angina pectoris: Secondary | ICD-10-CM | POA: Diagnosis not present

## 2020-01-09 DIAGNOSIS — M86272 Subacute osteomyelitis, left ankle and foot: Secondary | ICD-10-CM | POA: Diagnosis not present

## 2020-01-09 DIAGNOSIS — D869 Sarcoidosis, unspecified: Secondary | ICD-10-CM

## 2020-01-09 DIAGNOSIS — I739 Peripheral vascular disease, unspecified: Secondary | ICD-10-CM | POA: Diagnosis not present

## 2020-01-09 NOTE — Progress Notes (Signed)
Subjective:  Reason for Consult: DFU with osteomyelitis in mid foot  Requesting Physician: Dr. Shirline Frees   Patient ID: [name], male    DOB: 1945/10/24, 74 y.o.   MRN: [id number]  HPI   Very pleasant 74 year old Caucasian man with multiple medical problems including coronary artery disease, peripheral vascular disease diabetes mellitus and sarcoidosis with Charcot arthropathy who happened a hole and sustained a fracture of his ankle.  He was fitted for a actual boot.  He is as mentioned and noted to have significant peripheral vascular disease particular in the left side where he was evaluated by Dr. Alvester Chou but found not to have any suitable interventions that could be performed.  He began seeing Dr. Sharol Given on September 24, 2019.  Dr. Sharol Given was concerned that the patient might progress to having infection of the bone and that doing surgery at the site would be difficult to achieve a desirable outcome given his extensive peripheral vascular disease.  In the interim the patient developed blisters that opened up in the midfoot and were quite painful and draining fluid from them.  He was seen by [facility] persons with orthopedic surgery who placed him on Bactrim.  Films were obtained of the foot as well which show evidence of osteomyelitis.    Patient sepsis seen by Dr. Sharol Given and found that probe goes all the way to bone.  Is gone over various surgical options including option of excision of the ulcer and infected bone, placement antibiotic beads with 6 weeks of targeted parenteral antibiotics.  He did mention this was not likely to be an option that would be successful.  This is certainly something I agree with given the location of the infection given the extensive peripheral vascular disease both microscopically and undoubtedly microscopically.  A more reliable and curable option would be a below the knee amputation.     Past Medical History:  Diagnosis Date  . Adrenal insufficiency ([place])     . Allergy   . Anemia   . Arthritis    feet   . Broken foot 09/2019   had to wore a boot. Left   . CKD (chronic kidney disease), stage III ([place])   . Coronary artery disease    has stents  . Coronary atherosclerosis of native coronary artery    Proximal LAD, posterior lateral stent widely patent-10/12/11  . Diabetes mellitus    insulin and pills  . Hearing loss    wears hearing aids  . Heart attack ([place]) 2010  . History of blood transfusion 06/30/2016   [facility] - 2 units transfused  . Hypertension   . OSA (obstructive sleep apnea)    uses VPAC sleep study 2 years done through [facility]. Dr. Marlou Porch arranged study  . Pneumonia    3-4 years ago  . Sarcoid   . Sarcoidosis   . Sleep apnea    uses CPAP nightly  . Thyroid disease   . Type 2 diabetes mellitus ([place])     Past Surgical History:  Procedure Laterality Date  . APPENDECTOMY    . CATARACT EXTRACTION  2011   bilat  . CHOLECYSTECTOMY  01/25/2011   Procedure: LAPAROSCOPIC CHOLECYSTECTOMY WITH INTRAOPERATIVE CHOLANGIOGRAM;  Surgeon: Judieth Keens, DO;  Location: [facility];  Service: General;  Laterality: N/A;  . COLONOSCOPY     several  . COLONOSCOPY WITH PROPOFOL N/A 01/03/2020   Procedure: COLONOSCOPY WITH PROPOFOL;  Surgeon: Gatha Mayer, MD;  Location: [facility];  Service: Endoscopy;  Laterality: N/A;  . CORONARY ANGIOPLASTY     most recent 11/2009  . CORONARY STENT INTERVENTION N/A 08/07/2018   Procedure: CORONARY STENT INTERVENTION;  Surgeon: Jettie Booze, MD;  Location: [facility];  Service: Cardiovascular;  Laterality: N/A;  . CORONARY STENT PLACEMENT  2009   in LAD and side branch PTCA  . ESOPHAGOGASTRODUODENOSCOPY (EGD) WITH PROPOFOL N/A 01/03/2020   Procedure: ESOPHAGOGASTRODUODENOSCOPY (EGD) WITH PROPOFOL;  Surgeon: Gatha Mayer, MD;  [facility]: [facility];  Service: Endoscopy;  Laterality: N/A;  . HEMOSTASIS CLIP PLACEMENT  01/03/2020   Procedure: HEMOSTASIS CLIP PLACEMENT;  Surgeon:  Gatha Mayer, MD;  Location: [facility];  Service: Endoscopy;;  . INTERCOSTAL NERVE BLOCK  2011, 06/2016   x2. lumbar spine  . LEFT HEART CATHETERIZATION WITH CORONARY ANGIOGRAM Bilateral 10/12/2011   Procedure: LEFT HEART CATHETERIZATION WITH CORONARY ANGIOGRAM;  Surgeon: Candee Furbish, MD;  Location: [facility];  Service: Cardiovascular;  Laterality: Bilateral;  . LEFT HEART CATHETERIZATION WITH CORONARY ANGIOGRAM N/A 04/01/2014   Procedure: LEFT HEART CATHETERIZATION WITH CORONARY ANGIOGRAM;  Surgeon: Candee Furbish, MD;  Location: [facility] LAB;  Service: Cardiovascular;  Laterality: N/A;  . POLYPECTOMY  01/03/2020   Procedure: POLYPECTOMY;  Surgeon: Gatha Mayer, MD;  [facility]: [facility];  Service: Endoscopy;;  . RIGHT/LEFT HEART CATH AND CORONARY ANGIOGRAPHY N/A 08/07/2018   Procedure: RIGHT/LEFT HEART CATH AND CORONARY ANGIOGRAPHY;  Surgeon: Jettie Booze, MD;  Location: [facility];  Service: Cardiovascular;  Laterality: N/A;    Family History  Problem Relation Age of Onset  . Kidney disease Mother   . Diabetes Mother   . Kidney cancer Mother   . Heart attack Father   . Asthma Sister   . Anesthesia problems Sister        "Kidney's did not wake up"  . Sarcoidosis Sister   . Sarcoidosis Niece   . Colon cancer Neg Hx   . Colon polyps Neg Hx   . Rectal cancer Neg Hx   . Stomach cancer Neg Hx       Social History   Socioeconomic History  . Marital status: Married    Spouse name: Not on file  . Number of children: 3  . Years of education: Not on file  . Highest education level: Not on file  Occupational History  . 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 Smoking status: Never Smoker  . Smokeless tobacco: Never Used  Vaping Use  . Vaping Use: Never used  Substance and Sexual Activity  . Alcohol use: No  . Drug use: No  . Sexual activity: Not on file  Other Topics Concern    . Not on file  Social History Narrative  . Not on file   Social Determinants of Health   Financial Resource Strain:   . Difficulty of Paying Living Expenses: Not on file  Food Insecurity:   . Worried About Charity fundraiser in the Last Year: Not on file  . Ran Out of Food in the Last Year: Not on file  Transportation Needs:   . Lack of Transportation (Medical): Not on file  . Lack of Transportation (Non-Medical): Not on file  Physical Activity:   . Days of Exercise per Week: Not on file  . Minutes of Exercise per Session: Not on file  Stress:   . Feeling of Stress : Not on file  Social Connections:   . Frequency of Communication with Friends and Family: Not on file  . Frequency of Social Gatherings with Friends and Family: Not on file  . Attends Religious Services: Not on file  . Active Member of Clubs or Organizations: Not on file  . Attends Archivist Meetings: Not on file  . Marital Status: Not on file    Allergies  Allergen Reactions  . Statins Other (See Comments)    Pt says they make him "mean"  . Hydrocortisone Nausea Only  . Miralax [Polyethylene Glycol] Nausea And Vomiting    Patient refuses to take- reminded me today of this. ta  . Metolazone Other (See Comments)    Drained, no energy     Current Outpatient Medications:  .  acetaminophen (TYLENOL) 500 MG tablet, Take 500 mg by mouth daily as needed for moderate pain or headache. , Disp: , Rfl:  .  albuterol (PROVENTIL) (2.5 MG/3ML) 0.083% nebulizer solution, Take 3 mLs (2.5 mg total) by nebulization every 6 (six) hours as needed for wheezing or shortness of breath. Dx Code D86.0, Disp: 360 mL, Rfl: 5 .  Albuterol Sulfate (PROAIR RESPICLICK) 470 (90 BASE) MCG/ACT AEPB, Inhale 2 puffs into the lungs every 6 (six) hours as needed. (Patient taking differently: Inhale 2 puffs into the lungs every 6 (six) hours as needed (wheezing & shortness of breath). ), Disp: 1 each, Rfl: 6 .  Cholecalciferol (VITAMIN  D3) 3000 UNITS TABS, Take 3,000 Units by mouth daily., Disp: , Rfl:  .  Coenzyme Q10 (COQ-10) 400 MG CAPS, Take 400 mg by mouth every evening., Disp: , Rfl:  .  DROPLET INSULIN SYRINGE 31G X 5/16" 1 ML MISC, , Disp: , Rfl:  .  fish oil-omega-3 fatty acids 1000 MG capsule, Take 1 g by mouth daily. , Disp: , Rfl:  .  furosemide (LASIX) 80 MG tablet, Take 1 tablet (80 mg total) by mouth daily., Disp: 30 tablet, Rfl: 0 .  glipiZIDE (GLUCOTROL) 10 MG tablet, Take 10 mg by mouth every evening. , Disp: , Rfl:  .  insulin aspart protamine- aspart (NOVOLOG MIX 70/30) (70-30) 100 UNIT/ML injection, Inject 0.3 mLs (30 Units total) into the skin 2 (two) times daily with a meal., Disp: , Rfl:  .  levothyroxine (SYNTHROID, LEVOTHROID) 50 MCG tablet, Take 50 mcg by mouth daily. , Disp: , Rfl:  .  losartan (COZAAR) 100 MG tablet, Take 100 mg by mouth daily., Disp: , Rfl:  .  montelukast (SINGULAIR) 10 MG tablet, Take 10 mg by mouth daily as needed (allergies). , Disp: , Rfl:  .  Multiple Vitamin (MULTIVITAMIN WITH MINERALS) TABS tablet, Take 1 tablet by mouth daily., Disp:  , Rfl:  .  pantoprazole (PROTONIX) 40 MG tablet, Take 40 mg by mouth daily., Disp: , Rfl:  .  predniSONE (DELTASONE) 10 MG tablet, Take 1 tablet (10 mg total) by mouth daily with breakfast. T (Patient taking differently: Take 10 mg by mouth daily with breakfast. ), Disp: 30 tablet, Rfl: 6 .  rosuvastatin (CRESTOR) 10 MG tablet, TAKE 1 TABLET AT BEDTIME (Patient taking differently: Take 10 mg by mouth See admin instructions. Takes 1 tablet 2 times a week), Disp: 90 tablet, Rfl: 3 .  sulfamethoxazole-trimethoprim (BACTRIM DS) 800-160 MG tablet, Take 1 tablet by mouth 2 (two) times daily. (Patient taking differently: Take 1 tablet by mouth 2 (two) times daily. Start date : 63 /19/21), Disp: 60 tablet, Rfl: 0 .  Vitamin D, Ergocalciferol, (DRISDOL)  50000 UNITS CAPS capsule, Take 50,000 Units by mouth every Sunday. , Disp: , Rfl: 1 .  aspirin EC 81 MG  tablet, Take 81 mg by mouth every evening.  (Patient not taking: Reported on 01/09/2020), Disp: , Rfl:  .  brimonidine (ALPHAGAN) 0.2 % ophthalmic solution, Place 1 drop into the left eye daily.  (Patient not taking: Reported on 01/09/2020), Disp: , Rfl:  .  clopidogrel (PLAVIX) 75 MG tablet, Take 1 tablet (75 mg total) by mouth daily. (Patient not taking: Reported on 01/09/2020), Disp: 90 tablet, Rfl: 4 .  RABEprazole (ACIPHEX) 20 MG tablet, Take 20 mg by mouth daily as needed (acid reflux).  (Patient not taking: Reported on 01/09/2020), Disp: , Rfl:  .  traMADol (ULTRAM) 50 MG tablet, Take 50 mg by mouth every 6 (six) hours as needed for moderate pain. (Patient not taking: Reported on 01/09/2020), Disp: , Rfl:    Review of Systems  Constitutional: Negative for chills and fever.  HENT: Negative for congestion and sore throat.   Eyes: Negative for photophobia.  Respiratory: Negative for cough, shortness of breath and wheezing.   Cardiovascular: Negative for chest pain, palpitations and leg swelling.  Gastrointestinal: Negative for abdominal pain, blood in stool, constipation, diarrhea, nausea and vomiting.  Genitourinary: Negative for dysuria, flank pain and hematuria.  Musculoskeletal: Positive for gait problem. Negative for back pain and myalgias.  Skin: Positive for wound. Negative for rash.  Neurological: Negative for dizziness, weakness and headaches.  Hematological: Does not bruise/bleed easily.  Psychiatric/Behavioral: Negative for agitation, behavioral problems, confusion, decreased concentration, dysphoric mood, hallucinations, self-injury and suicidal ideas.       Objective:   Physical Exam Constitutional:      General: He is not in acute distress.    Appearance: Normal appearance. He is well-developed. He is not ill-appearing or diaphoretic.  HENT:     Head: Normocephalic and atraumatic.     Right Ear: Hearing and external ear normal.     Left Ear: Hearing and external ear  normal.     Nose: No nasal deformity or rhinorrhea.  Eyes:     General: No scleral icterus.    Conjunctiva/sclera: Conjunctivae normal.     Right eye: Right conjunctiva is not injected.     Left eye: Left conjunctiva is not injected.     Pupils: Pupils are equal, round, and reactive to light.  Neck:     Vascular: No JVD.  Cardiovascular:     Rate and Rhythm: Normal rate and regular rhythm.     Heart sounds: S1 normal and S2 normal. No murmur heard.   Pulmonary:     Effort: Pulmonary effort is normal. No respiratory distress.     Breath sounds: No wheezing.  Abdominal:     General: There is no distension.     Palpations: Abdomen is soft.  Musculoskeletal:        General: Normal range of motion.     Right shoulder: Normal.     Left shoulder: Normal.     Cervical back: Normal range of motion and neck supple.     Right hip: Normal.     Left hip: Normal.     Right knee: Normal.     Left knee: Normal.  Lymphadenopathy:     Head:     Right side of head: No submandibular, preauricular or posterior auricular adenopathy.     Left side of head: No submandibular, preauricular or posterior auricular adenopathy.     Cervical: No  cervical adenopathy.     Right cervical: No superficial or deep cervical adenopathy.    Left cervical: No superficial or deep cervical adenopathy.  Skin:    General: Skin is warm and dry.     Coloration: Skin is not pale.     Findings: No abrasion, bruising, ecchymosis, erythema, lesion or rash.     Nails: There is no clubbing.  Neurological:     General: No focal deficit present.     Mental Status: He is alert and oriented to person, place, and time.     Sensory: No sensory deficit.     Coordination: Coordination normal.     Gait: Gait normal.  Psychiatric:        Attention and Perception: He is attentive.        Mood and Affect: Mood normal.        Speech: Speech normal.        Behavior: Behavior normal. Behavior is cooperative.        Thought Content:  Thought content normal.        Judgment: Judgment normal.     Left foot wound 01/09/2020:          Assessment & Plan:   Mid foot osteomyelitis and patient with sarcoidosis, poorly controlled diabetes mellitus peripheral vascular disease and neuropathy:  I agree with Dr. Sharol Given that the patient needs a transtibial amputation to affect cure.  I share his skepticism of an approach of removing bone and treating the patient with 6 weeks of antibiotics to try to cure it.  I worry that he will have great deal of difficulty healing this site if he can even eradicate the infection and I would be more prone to have him on lifelong oral antibiotics.  I will recheck his labs today I am a bit concerned about him being on Bactrim with chronic kidney disease may be he is tolerating it without elevation in his potassium or creatinine.  If he has no abnormalities we can continue on the Bactrim.  I have told the patient if he undergoes a BKA he can continue the antibiotics through the date of amputation.  If instead he wants removal of bone to inform cultures I would like him to stop antibiotics 1 month prior to surgery.  I will get MRI of the foot and ankle per their request.  The patient is having more swelling on the dorsum of his foot so I think this is not an unreasonable thing to do to look for additional pathology we do not know about.  I spent greater than 55 minutes with the patient including greater than 50% of time in face to face counsel of the patient and his wife regarding the nature of osteomyelitis in particular in diabetic foot infections with peripheral vascular disease means needs for surgical and surgical medical management and in coordination of their care.

## 2020-01-10 ENCOUNTER — Other Ambulatory Visit: Payer: Self-pay | Admitting: Internal Medicine

## 2020-01-10 DIAGNOSIS — D509 Iron deficiency anemia, unspecified: Secondary | ICD-10-CM

## 2020-01-10 LAB — BASIC METABOLIC PANEL WITH GFR
BUN/Creatinine Ratio: 15 (calc) (ref 6–22)
BUN: 32 mg/dL — ABNORMAL HIGH (ref 7–25)
CO2: 24 mmol/L (ref 20–32)
Calcium: 9.1 mg/dL (ref 8.6–10.3)
Chloride: 102 mmol/L (ref 98–110)
Creat: 2.17 mg/dL — ABNORMAL HIGH (ref 0.70–1.18)
GFR, Est African American: 34 mL/min/{1.73_m2} — ABNORMAL LOW (ref >=60)
GFR, Est Non African American: 29 mL/min/{1.73_m2} — ABNORMAL LOW (ref >=60)
Glucose, Bld: 255 mg/dL — ABNORMAL HIGH (ref 65–99)
Potassium: 4.5 mmol/L (ref 3.5–5.3)
Sodium: 137 mmol/L (ref 135–146)

## 2020-01-10 LAB — CBC WITH DIFFERENTIAL/PLATELET
Absolute Monocytes: 972 cells/uL — ABNORMAL HIGH (ref 200–950)
Basophils Absolute: 108 cells/uL (ref 0–200)
Basophils Relative: 0.8 %
Eosinophils Absolute: 81 cells/uL (ref 15–500)
Eosinophils Relative: 0.6 %
HCT: 32.8 % — ABNORMAL LOW (ref 38.5–50.0)
Hemoglobin: 10 g/dL — ABNORMAL LOW (ref 13.2–17.1)
Lymphs Abs: 1931 cells/uL (ref 850–3900)
MCH: 26.8 pg — ABNORMAL LOW (ref 27.0–33.0)
MCHC: 30.5 g/dL — ABNORMAL LOW (ref 32.0–36.0)
MCV: 87.9 fL (ref 80.0–100.0)
MPV: 9.4 fL (ref 7.5–12.5)
Monocytes Relative: 7.2 %
Neutro Abs: 10409 cells/uL — ABNORMAL HIGH (ref 1500–7800)
Neutrophils Relative %: 77.1 %
Platelets: 387 10*3/uL (ref 140–400)
RBC: 3.73 10*6/uL — ABNORMAL LOW (ref 4.20–5.80)
RDW: 17.1 % — ABNORMAL HIGH (ref 11.0–15.0)
Total Lymphocyte: 14.3 %
WBC: 13.5 10*3/uL — ABNORMAL HIGH (ref 3.8–10.8)

## 2020-01-10 LAB — SEDIMENTATION RATE: Sed Rate: 65 mm/h — ABNORMAL HIGH (ref 0–20)

## 2020-01-10 LAB — C-REACTIVE PROTEIN: CRP: 16.3 mg/L — ABNORMAL HIGH (ref <8.0)

## 2020-01-14 ENCOUNTER — Telehealth: Payer: Self-pay

## 2020-01-14 NOTE — Telephone Encounter (Signed)
I called pt and lm on vm to follow up from last weeks visit. Advised that if he wanted to come back into the office or had any other questions/ concerns for Dr. Sharol Given that he would be happy to discuss that with him. Advised to call back

## 2020-01-20 ENCOUNTER — Ambulatory Visit (HOSPITAL_COMMUNITY)
Admission: RE | Admit: 2020-01-20 | Discharge: 2020-01-20 | Disposition: A | Discharge status: Home / Self Care | Payer: Medicare HMO | Source: Ambulatory Visit | Attending: Infectious Disease | Admitting: Infectious Disease

## 2020-01-20 ENCOUNTER — Other Ambulatory Visit: Payer: Self-pay

## 2020-01-20 ENCOUNTER — Other Ambulatory Visit (INDEPENDENT_AMBULATORY_CARE_PROVIDER_SITE_OTHER): Payer: Medicare HMO

## 2020-01-20 DIAGNOSIS — M86272 Subacute osteomyelitis, left ankle and foot: Secondary | ICD-10-CM | POA: Diagnosis not present

## 2020-01-20 DIAGNOSIS — M7989 Other specified soft tissue disorders: Secondary | ICD-10-CM | POA: Diagnosis not present

## 2020-01-20 DIAGNOSIS — D509 Iron deficiency anemia, unspecified: Secondary | ICD-10-CM | POA: Diagnosis not present

## 2020-01-20 DIAGNOSIS — L97529 Non-pressure chronic ulcer of other part of left foot with unspecified severity: Secondary | ICD-10-CM | POA: Diagnosis not present

## 2020-01-20 LAB — CBC
HCT: 31.7 % — ABNORMAL LOW (ref 39.0–52.0)
Hemoglobin: 10.2 g/dL — ABNORMAL LOW (ref 13.0–17.0)
MCHC: 32.1 g/dL (ref 30.0–36.0)
MCV: 85.5 fl (ref 78.0–100.0)
Platelets: 357 10*3/uL (ref 150.0–400.0)
RBC: 3.71 Mil/uL — ABNORMAL LOW (ref 4.22–5.81)
RDW: 21.2 % — ABNORMAL HIGH (ref 11.5–15.5)
WBC: 13 10*3/uL — ABNORMAL HIGH (ref 4.0–10.5)

## 2020-01-20 IMAGING — MR MR ANKLE*L* W/O CM
5 series · 40 of 40 positions shown · non-contrast
Comparison: Radiographs [DATE] and [DATE].

CLINICAL DATA: Plantar foot ulcer with pain, erythema and swelling
for 2 months. History of diabetes. Concern for osteomyelitis.

EXAM:
MRI OF THE LEFT ANKLE WITHOUT CONTRAST
TECHNIQUE: Multiplanar, multisequence MR imaging of the ankle was performed. No
intravenous contrast was administered.

[Series 12: T1 · coronal · left · 3.0mm · 0.55mm/px · 8 of 34 slices shown (1 of 2)]
[im 1/34]
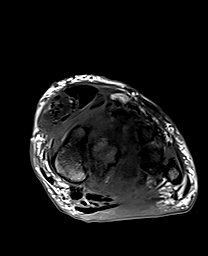
[im 5/34]
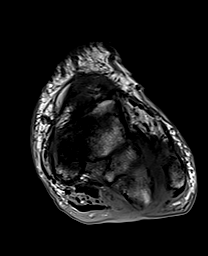
[im 10/34]
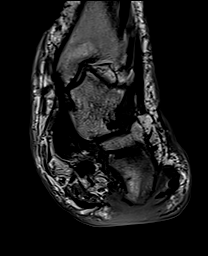
[im 15/34]
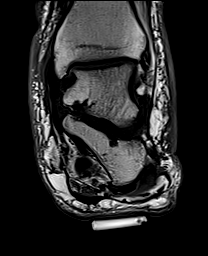
[im 19/34]
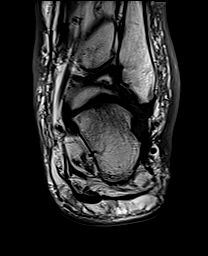
[im 24/34]
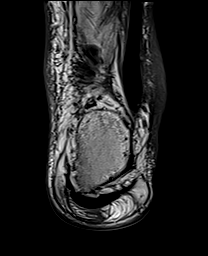
[im 29/34]
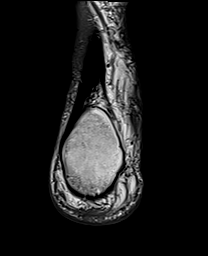
[im 34/34]
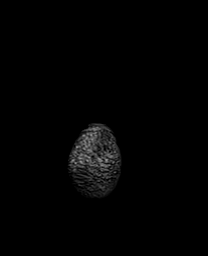

[Series 14: T1 · axial · left · 3.0mm · 0.55mm/px · z∈[-29,+90]mm · 9 of 35 slices shown (2 of 2)]
[im 1/35]
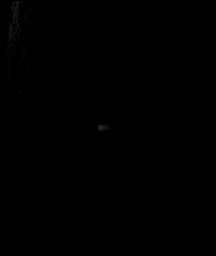
[im 5/35]
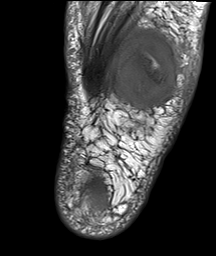
[im 9/35]
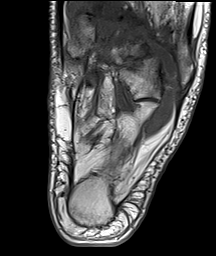
[im 13/35]
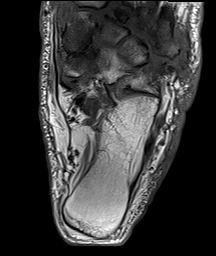
[im 18/35]
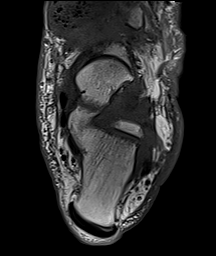
[im 22/35]
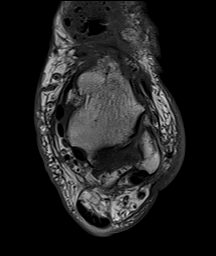
[im 26/35]
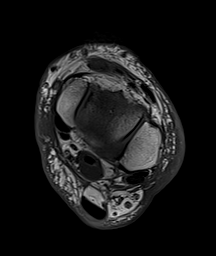
[im 30/35]
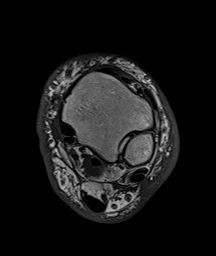
[im 35/35]
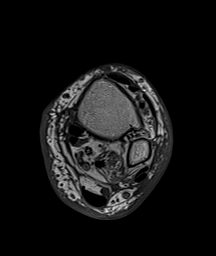

[Series 15: T2 fat-sat · coronal · left · 3.0mm · 0.55mm/px · 8 of 34 slices shown (1 of 2)]
[im 1/34]
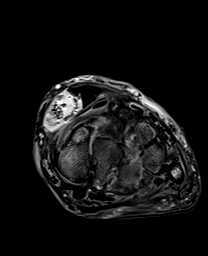
[im 5/34]
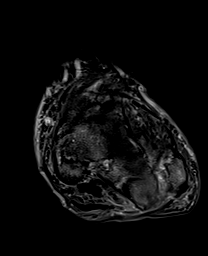
[im 10/34]
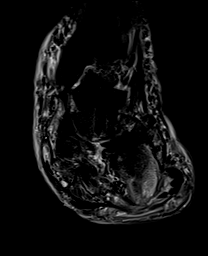
[im 15/34]
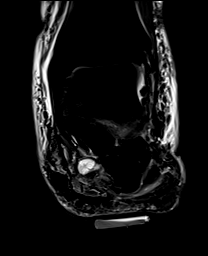
[im 19/34]
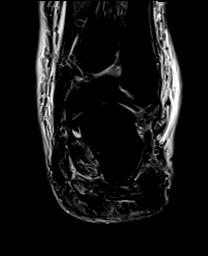
[im 24/34]
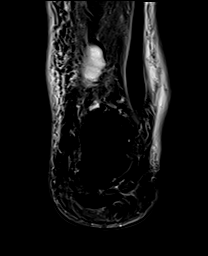
[im 29/34]
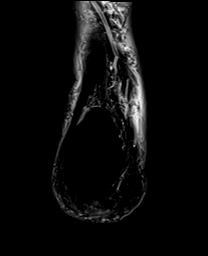
[im 34/34]
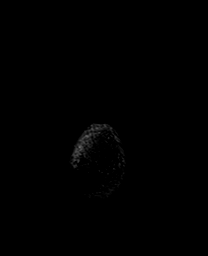

[Series 16: T2 fat-sat · axial · left · 3.0mm · 0.44mm/px · z∈[-29,+90]mm · 9 of 35 slices shown (2 of 2)]
[im 1/35]
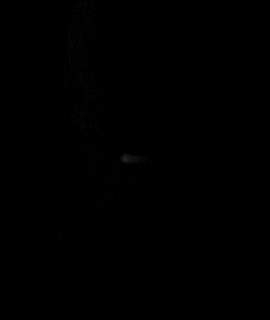
[im 5/35]
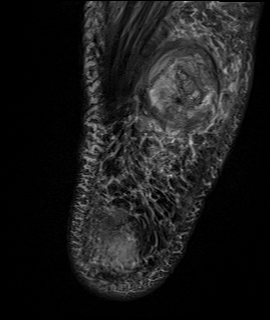
[im 9/35]
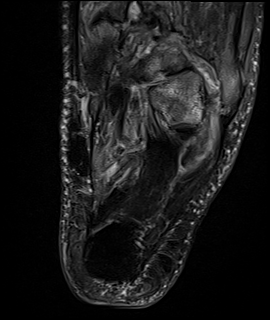
[im 13/35]
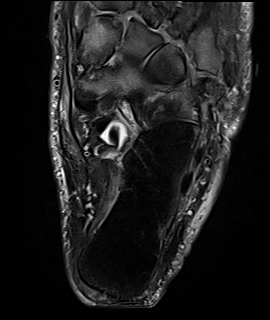
[im 18/35]
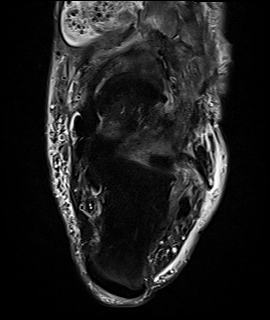
[im 22/35]
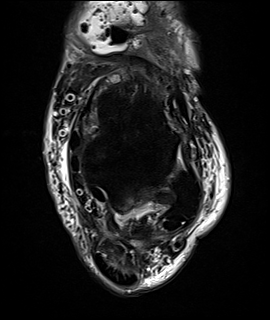
[im 26/35]
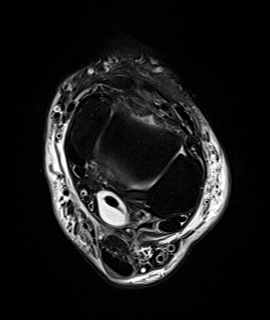
[im 30/35]
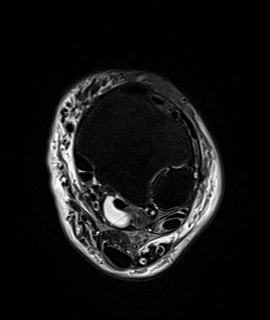
[im 35/35]
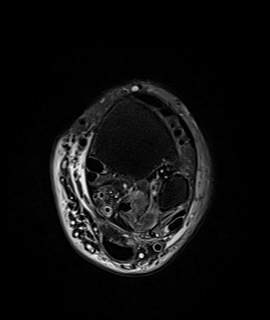

[Series 17: STIR · sagittal · left · 3.0mm · 0.27mm/px · 6 of 26 slices shown]
[im 1/26]
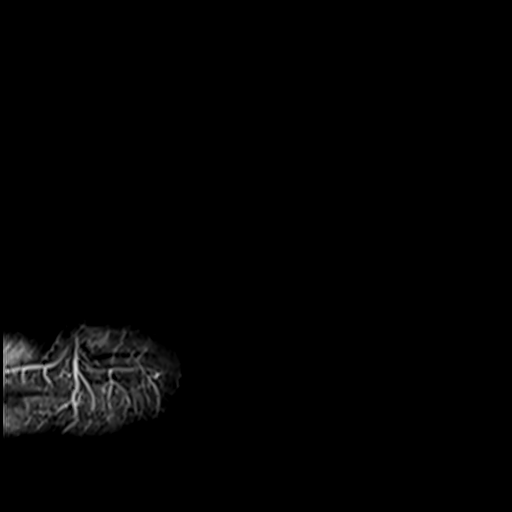
[im 6/26]
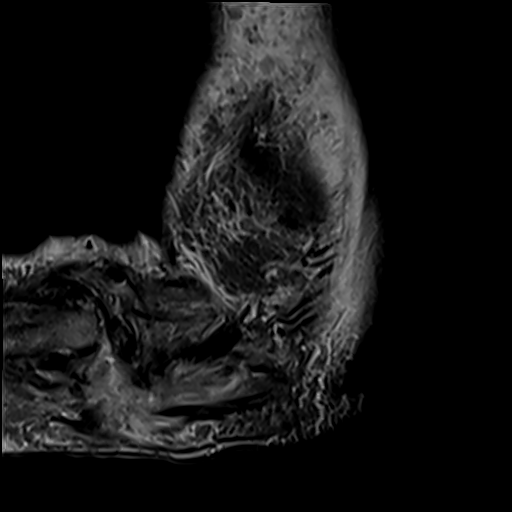
[im 11/26]
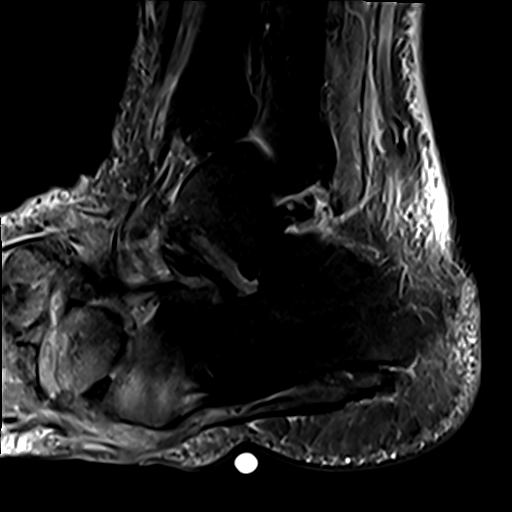
[im 16/26]
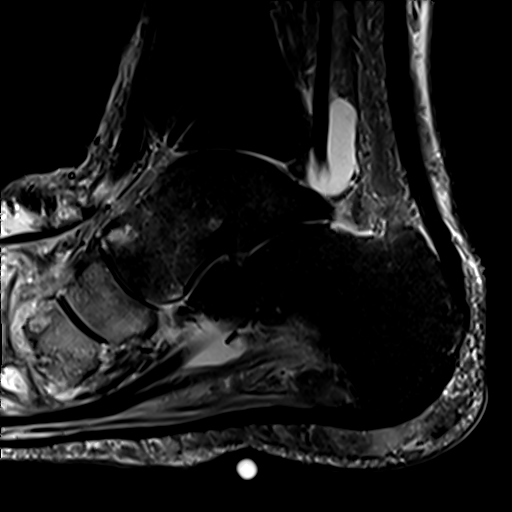
[im 21/26]
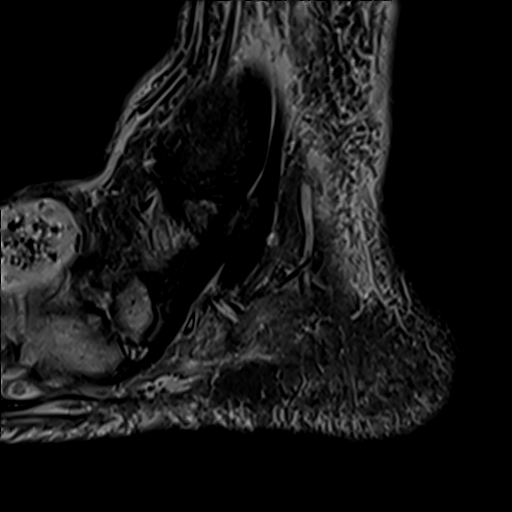
[im 26/26]
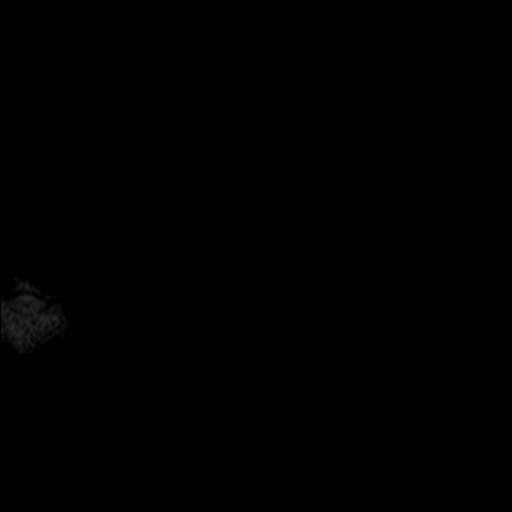

[40 of 40 positions shown; findings below may reference images not displayed]

FINDINGS: Forefoot findings are dictated separately.

TENDONS

Peroneal: The peroneus longus tendon is poorly defined inferolateral
to the cuboid and may be torn. The peroneal tendons appear
unremarkable at the level of the lateral malleolus. No significant
tenosynovitis.

Posteromedial: Intact and normally positioned. Prominent fluid in
the flexor hallucis longus tendon sheath.

Anterior: Intact and normally positioned at the ankle. Complex
air-fluid collection surrounds the extensor tendons of the great
toe, further described on separate report of the forefoot.

Achilles: Intact.

Plantar Fascia: Intact.

LIGAMENTS

Lateral: The anterior and posterior talofibular and calcaneofibular
ligaments are intact.

Medial: The deltoid and visualized portions of the spring ligament
appear intact.

CARTILAGE AND BONES

Ankle Joint: No significant ankle joint effusion. The talar dome and
tibial plafond are intact.

Subtalar Joints/Sinus Tarsi: Unremarkable.

Bones: As described on separate report of the forefoot, there are
severe chronic changes of Charcot arthropathy involving the midfoot.
There is chronic sclerosis and fragmentation of the metatarsal bases
and cuneiform. The metatarsals are subluxed dorsally and laterally.
The cuboid is inferiorly displaced and approximates a plantar
midfoot ulcer. There are signal changes inferiorly in the cuboid
which could indicate early osteomyelitis. No evidence of
osteomyelitis within the talus or calcaneus.

Other: As above, plantar soft tissue ulceration inferior to the
cuboid with surrounding soft tissue inflammatory changes. This ulcer
is in close proximity to the cuboid, and signal changes in the
cuboid could indicate osteomyelitis. As above, the peroneus longus
tendon may be torn in this area.
IMPRESSION: 1. Soft tissue ulceration inferior to the cuboid with surrounding
soft tissue inflammatory changes. This ulcer is in close proximity
to the cuboid which demonstrates signal changes that could indicate
osteomyelitis.
2. No other evidence of hindfoot osteomyelitis.
3. Severe chronic changes of Charcot arthropathy involving the
midfoot. See separate forefoot report.
4. Possible tear of the peroneus longus tendon inferolateral to the
cuboid.
5. These results will be called to the ordering clinician or
representative by the Radiologist Assistant, and communication
documented in the PACS or [REDACTED].

## 2020-01-20 IMAGING — MR MR FOOT*L* W/O CM
5 series · 40 of 40 positions shown · non-contrast
Comparison: Radiographs [DATE] and [DATE].

CLINICAL DATA: Plantar foot ulcer with pain, erythema and swelling
for 2 months. History of diabetes. Concern for osteomyelitis.

EXAM:
MRI OF THE LEFT FOOT WITHOUT CONTRAST
TECHNIQUE: Multiplanar, multisequence MR imaging of the left forefoot was
performed. No intravenous contrast was administered.

[Series 4: T2 fat-sat · axial · left · 3.0mm · 0.55mm/px · z∈[-39,+35]mm · 6 of 22 slices shown (1 of 2)]
[im 1/22]
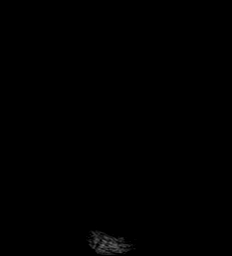
[im 5/22]
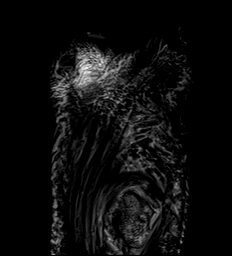
[im 9/22]
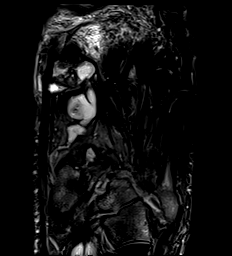
[im 13/22]
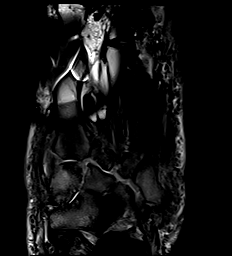
[im 17/22]
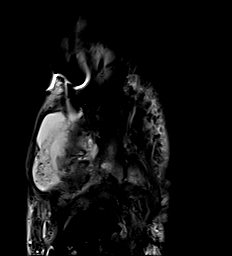
[im 22/22]
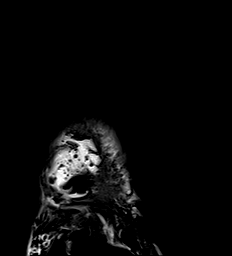

[Series 5: T1 · axial · left · 3.0mm · 0.55mm/px · z∈[-38,+36]mm · 6 of 22 slices shown (1 of 2)]
[im 1/22]
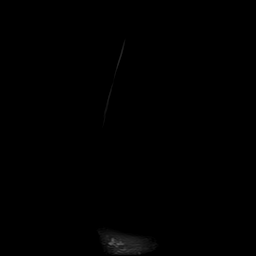
[im 5/22]
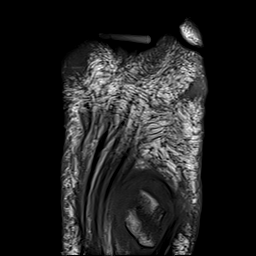
[im 9/22]
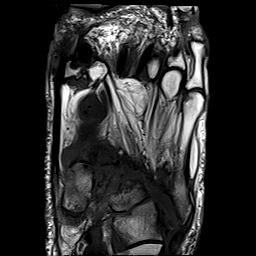
[im 13/22]
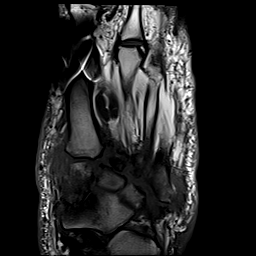
[im 17/22]
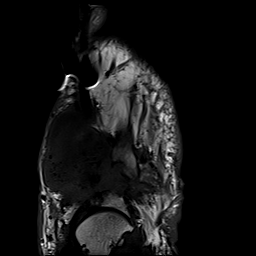
[im 22/22]
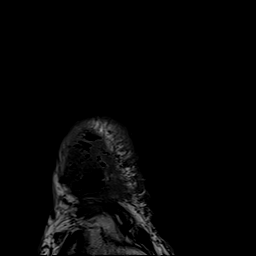

[Series 7: T2 fat-sat · coronal · left · 3.0mm · 0.38mm/px · 10 of 35 slices shown (2 of 2)]
[im 1/35]
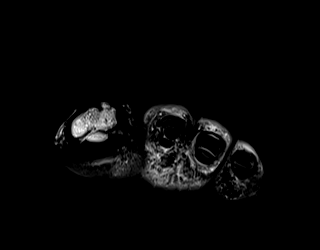
[im 4/35]
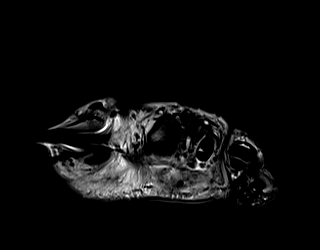
[im 8/35]
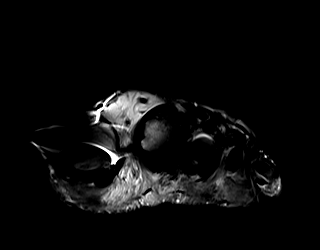
[im 12/35]
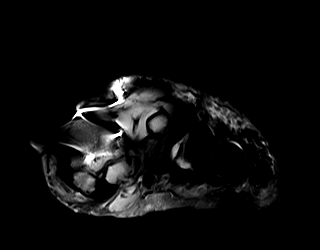
[im 16/35]
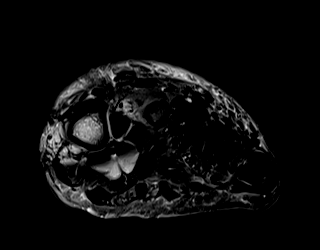
[im 19/35]
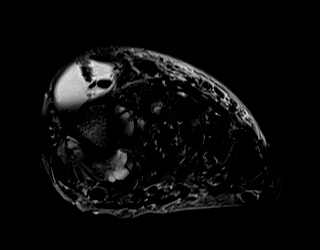
[im 23/35]
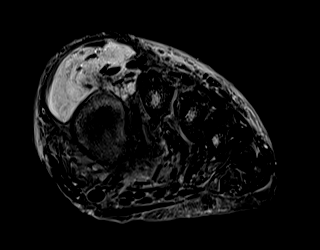
[im 27/35]
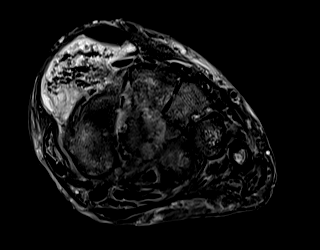
[im 31/35]
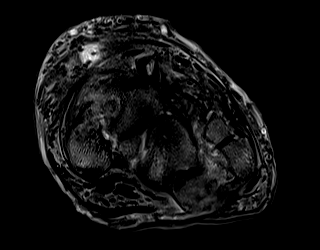
[im 35/35]
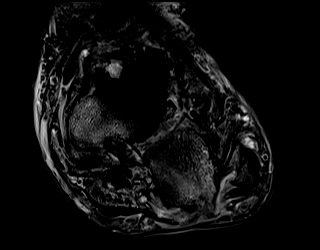

[Series 8: STIR · sagittal · left · 3.0mm · 0.27mm/px · 8 of 28 slices shown]
[im 1/28]
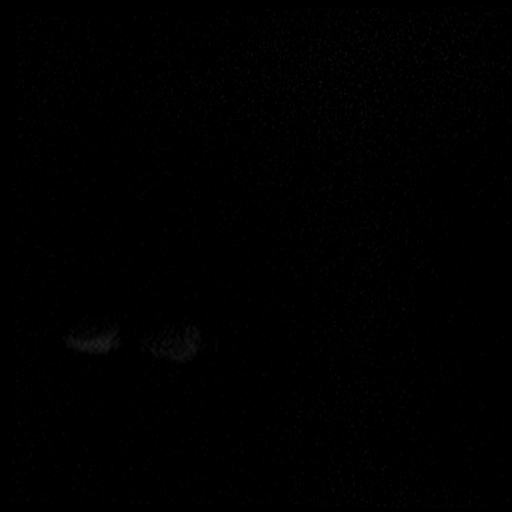
[im 4/28]
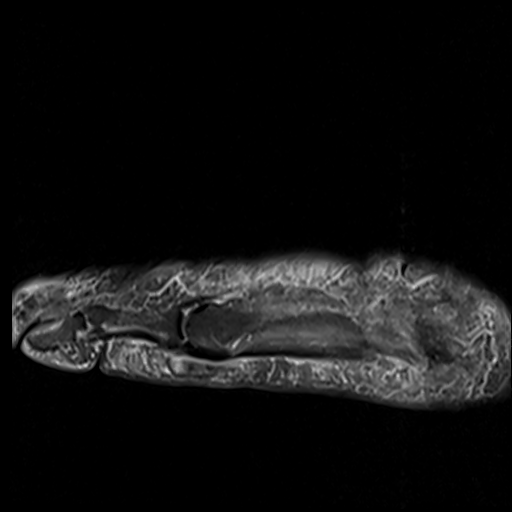
[im 8/28]
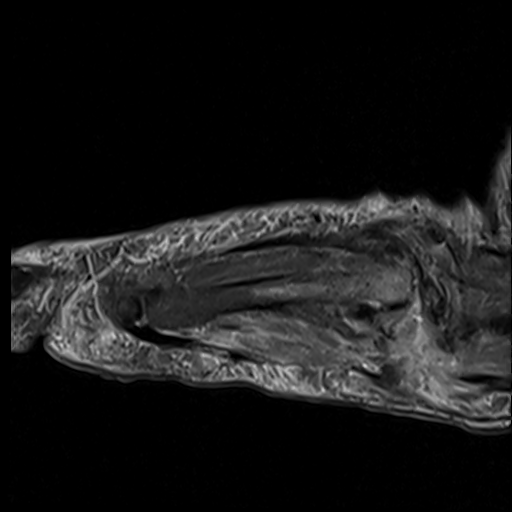
[im 12/28]
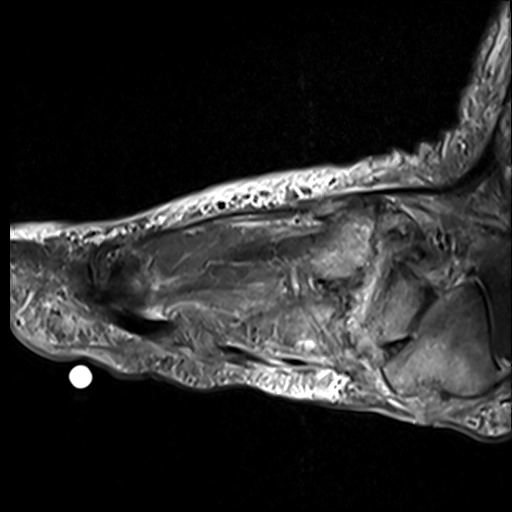
[im 16/28]
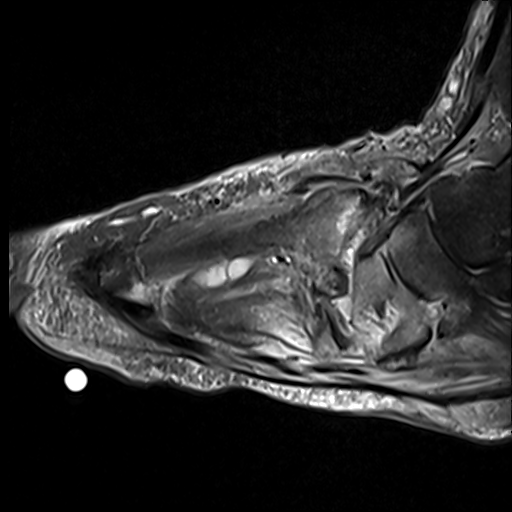
[im 20/28]
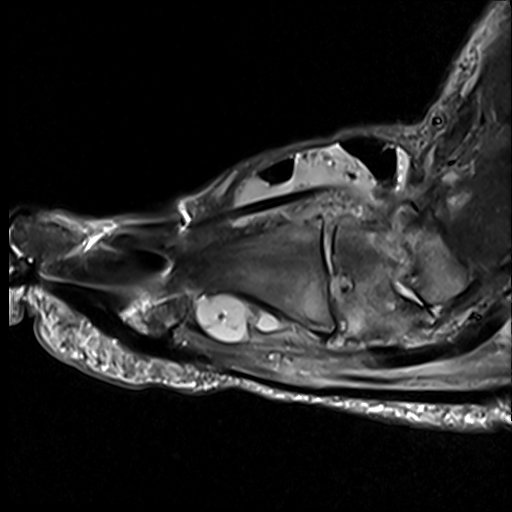
[im 24/28]
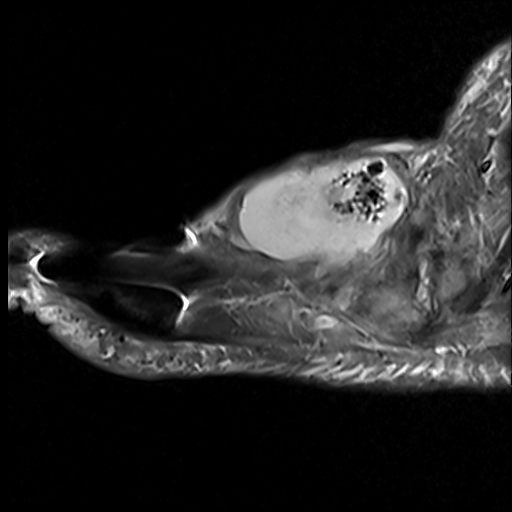
[im 28/28]
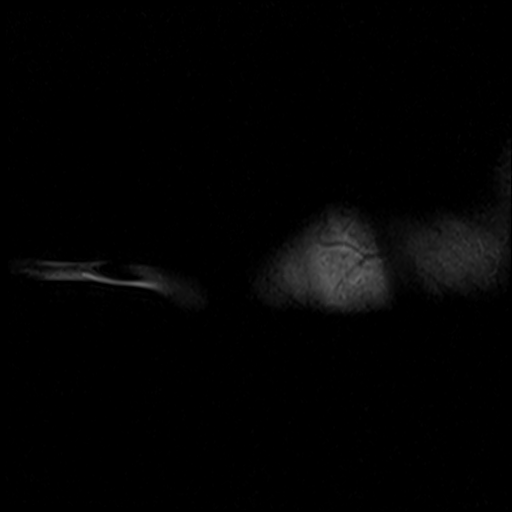

[Series 9: T1 · coronal · left · 3.0mm · 0.47mm/px · 10 of 35 slices shown (2 of 2)]
[im 1/35]
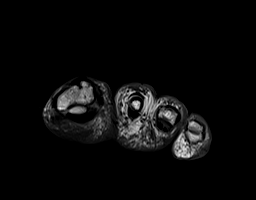
[im 4/35]
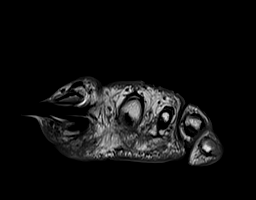
[im 8/35]
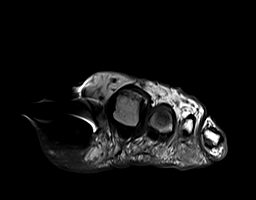
[im 12/35]
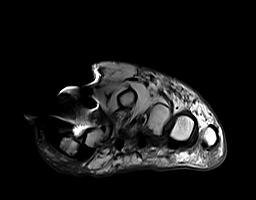
[im 16/35]
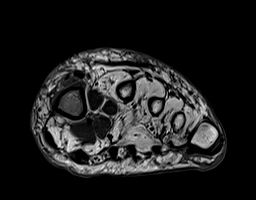
[im 19/35]
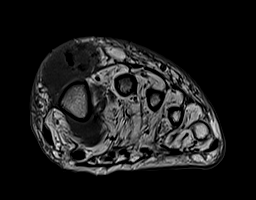
[im 23/35]
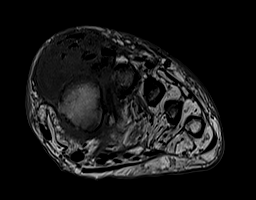
[im 27/35]
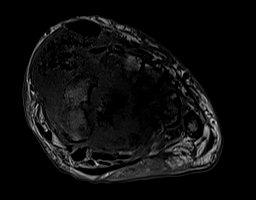
[im 31/35]
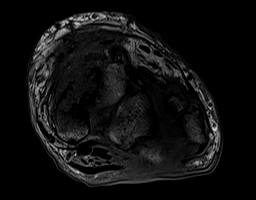
[im 35/35]
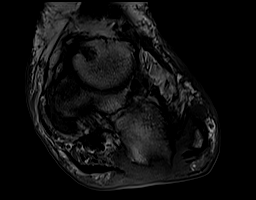

[40 of 40 positions shown; findings below may reference images not displayed]

FINDINGS: Ankle and hindfoot findings are dictated separately.

Bones/Joint/Cartilage

As correlated with prior radiographs, there are chronic advanced
Charcot changes within the midfoot which limit assessment for
superimposed osteomyelitis. In addition, there is artifact related
to previous arthrodesis of the 1st metatarsophalangeal joint.
Sclerosis and fragmentation of the metatarsal bases and cuneiform
bones appear grossly stable, attributed to the Charcot arthropathy.
There is dorsal and lateral subluxation of the metatarsals relative
to the cuneiform bones. There is plantar displacement of the cuboid
which closely approximates a plantar midfoot ulcer. There are
underlying signal changes within the plantar aspect of the cuboid
which could indicate early osteomyelitis.

Ligaments

Chronic Charcot changes without osseous fragmentation and
subluxation at the Lisfranc joint.

Muscles and Tendons

Diffuse fatty atrophy of the forefoot musculature. There is a
complex air and fluid collection dorsal to the medial cuneiform and
1st metatarsal, measuring 4.7 x 4.1 x 2.8 cm, suspicious for an
abscess. This encompasses the extensor tendons of the 1st toe. The
other forefoot tendons appear unremarkable.

Soft tissues

As above, large complex air fluid collection dorsal to the medial
cuneiform and 1st metatarsal. In addition, there are smaller fluid
collections along the plantar and medial aspects of the 1st
metatarsal. There is a plantar midfoot soft tissue ulcer adjacent to
the cuboid with surrounding inflammatory changes, but no focal fluid
collection in this region. See separate report of the ankle.
IMPRESSION: 1. Large complex air and fluid collection dorsal to the medial
cuneiform and 1st metatarsal, partially encompassing the extensor
tendons, suspicious for an abscess/infectious tenosynovitis.
2. Plantar midfoot soft tissue ulceration with underlying signal
changes within the plantar aspect of the cuboid which could indicate
early osteomyelitis.
3. Chronic advanced Charcot changes within the midfoot which limit
assessment for superimposed osteomyelitis. No other findings highly
suspicious for superimposed osteomyelitis.
4. These results will be called to the ordering clinician or
representative by the Radiologist Assistant, and communication
documented in the PACS or [REDACTED].

## 2020-01-21 ENCOUNTER — Other Ambulatory Visit: Payer: Self-pay

## 2020-01-21 ENCOUNTER — Telehealth: Payer: Self-pay

## 2020-01-21 DIAGNOSIS — D509 Iron deficiency anemia, unspecified: Secondary | ICD-10-CM

## 2020-01-21 NOTE — Telephone Encounter (Signed)
Received call from [facility] with STAT reports. Page sent to Dr. Tommy Medal

## 2020-01-21 NOTE — Telephone Encounter (Signed)
See result note.  

## 2020-01-21 NOTE — Telephone Encounter (Signed)
Left message to please call back. °

## 2020-01-21 NOTE — Telephone Encounter (Signed)
Spoke with Dr. Tommy Medal he will updates notes and send to Dr Sharol Given to get patient seen soon.  [name]

## 2020-01-22 ENCOUNTER — Other Ambulatory Visit: Payer: Self-pay | Admitting: Physician Assistant

## 2020-01-22 ENCOUNTER — Telehealth: Payer: Self-pay

## 2020-01-22 NOTE — Telephone Encounter (Signed)
He has evidence of an abscess and osteomyelitis.  I have discussed with Dr Sharol Given today who will try to get him into clinic with him as soon as possible. Patient continues to likely need BKA for cure  He does have a target now for I and D and cultures If pt wants to go for less aggressive surgical route.  IN case that is pursued and cultures to be obtained I would like him to stop  his antibiotics. I would prefer Dr Sharol Given go over the films with him when he presents with surgical options but I can also call the patient in the am as well and will do so

## 2020-01-22 NOTE — Telephone Encounter (Signed)
Patient calling for results of MRI completed on Monday.   Please advise.    Laverle Patter, RN

## 2020-01-23 ENCOUNTER — Ambulatory Visit: Payer: Medicare HMO | Admitting: Orthopedic Surgery

## 2020-01-23 ENCOUNTER — Emergency Department (HOSPITAL_BASED_OUTPATIENT_CLINIC_OR_DEPARTMENT_OTHER)
Admission: EM | Admit: 2020-01-23 | Discharge: 2020-01-23 | Disposition: A | Discharge status: Home / Self Care | Payer: Medicare HMO | Attending: Emergency Medicine | Admitting: Emergency Medicine

## 2020-01-23 ENCOUNTER — Encounter: Payer: Self-pay | Admitting: Orthopedic Surgery

## 2020-01-23 ENCOUNTER — Other Ambulatory Visit: Payer: Self-pay

## 2020-01-23 ENCOUNTER — Encounter (HOSPITAL_BASED_OUTPATIENT_CLINIC_OR_DEPARTMENT_OTHER): Payer: Self-pay | Admitting: *Deleted

## 2020-01-23 VITALS — Ht 70.0 in | Wt 227.0 lb

## 2020-01-23 DIAGNOSIS — S0502XA Injury of conjunctiva and corneal abrasion without foreign body, left eye, initial encounter: Secondary | ICD-10-CM

## 2020-01-23 DIAGNOSIS — X58XXXA Exposure to other specified factors, initial encounter: Secondary | ICD-10-CM | POA: Diagnosis not present

## 2020-01-23 DIAGNOSIS — Z955 Presence of coronary angioplasty implant and graft: Secondary | ICD-10-CM | POA: Diagnosis not present

## 2020-01-23 DIAGNOSIS — I5032 Chronic diastolic (congestive) heart failure: Secondary | ICD-10-CM | POA: Diagnosis not present

## 2020-01-23 DIAGNOSIS — M86272 Subacute osteomyelitis, left ankle and foot: Secondary | ICD-10-CM

## 2020-01-23 DIAGNOSIS — I2510 Atherosclerotic heart disease of native coronary artery without angina pectoris: Secondary | ICD-10-CM | POA: Insufficient documentation

## 2020-01-23 DIAGNOSIS — H1132 Conjunctival hemorrhage, left eye: Secondary | ICD-10-CM | POA: Insufficient documentation

## 2020-01-23 DIAGNOSIS — Z79899 Other long term (current) drug therapy: Secondary | ICD-10-CM | POA: Diagnosis not present

## 2020-01-23 DIAGNOSIS — Z794 Long term (current) use of insulin: Secondary | ICD-10-CM | POA: Diagnosis not present

## 2020-01-23 DIAGNOSIS — E119 Type 2 diabetes mellitus without complications: Secondary | ICD-10-CM | POA: Insufficient documentation

## 2020-01-23 DIAGNOSIS — E039 Hypothyroidism, unspecified: Secondary | ICD-10-CM | POA: Diagnosis not present

## 2020-01-23 DIAGNOSIS — Z7982 Long term (current) use of aspirin: Secondary | ICD-10-CM | POA: Diagnosis not present

## 2020-01-23 DIAGNOSIS — Z7984 Long term (current) use of oral hypoglycemic drugs: Secondary | ICD-10-CM | POA: Insufficient documentation

## 2020-01-23 DIAGNOSIS — M14672 Charcot's joint, left ankle and foot: Secondary | ICD-10-CM

## 2020-01-23 DIAGNOSIS — E1165 Type 2 diabetes mellitus with hyperglycemia: Secondary | ICD-10-CM | POA: Diagnosis not present

## 2020-01-23 DIAGNOSIS — I130 Hypertensive heart and chronic kidney disease with heart failure and stage 1 through stage 4 chronic kidney disease, or unspecified chronic kidney disease: Secondary | ICD-10-CM | POA: Diagnosis not present

## 2020-01-23 DIAGNOSIS — N183 Chronic kidney disease, stage 3 unspecified: Secondary | ICD-10-CM | POA: Diagnosis not present

## 2020-01-23 MED ORDER — FLUORESCEIN SODIUM 1 MG OP STRP
1.0000 | ORAL_STRIP | Freq: Once | OPHTHALMIC | Status: AC
  Administered 2020-01-23: 1 via OPHTHALMIC
  Filled 2020-01-23: qty 1

## 2020-01-23 MED ORDER — POLYMYXIN B-TRIMETHOPRIM 10000-0.1 UNIT/ML-% OP SOLN
2.0000 [drp] | OPHTHALMIC | Status: DC
  Administered 2020-01-23: 2 [drp] via OPHTHALMIC
  Filled 2020-01-23: qty 10

## 2020-01-23 MED ORDER — SULFAMETHOXAZOLE-TRIMETHOPRIM 800-160 MG PO TABS: 1.0000 | ORAL_TABLET | Freq: Two times a day (BID) | ORAL | 0 refills | Status: DC

## 2020-01-23 MED ORDER — TETRACAINE HCL 0.5 % OP SOLN
2.0000 [drp] | Freq: Once | OPHTHALMIC | Status: AC
  Administered 2020-01-23: 2 [drp] via OPHTHALMIC
  Filled 2020-01-23: qty 4

## 2020-01-23 NOTE — Telephone Encounter (Signed)
Patient seen by Dr Sharol Given today.

## 2020-01-23 NOTE — Progress Notes (Signed)
Office Visit Note   Patient: [name]           Date of Birth: [birth year]           MRN: [id number] Visit Date: 01/23/2020              Requested by: Shirline Frees, MD [place],  [place] [address] PCP: Shirline Frees, MD  Chief Complaint  Patient presents with  . Left Foot - Follow-up    MRI      HPI: Patient is a 74 year old gentleman with a diabetic Charcot collapse of the left foot with a draining ulcer beneath the cuboid.  Patient states that since he started the antibiotics his foot is looking much better the swelling has resolved the draining has decreased and he denies any symptoms at this time.  Assessment & Plan: Visit Diagnoses:  1. Charcot's joint, left ankle and foot   2. Subacute osteomyelitis, left ankle and foot ([place])   3. Uncontrolled type 2 diabetes mellitus with hyperglycemia ([place])     Plan: Discussed with the patient that at a minimum we should proceed with partial cuboid excision send this off for cultures.  Patient states that he has a trip planned and would like to wait until after December 10.  We will continue his sulfamethoxazole trimethoprim and the antibiotic was called then continue with wound care.  Plan for surgery after December 10.  Follow-Up Instructions: Return if symptoms worsen or fail to improve.   Ortho Exam  Patient is alert, oriented, no adenopathy, well-dressed, normal affect, normal respiratory effort. Examination patient has a good dorsalis pedis pulse the swelling and redness over the dorsum of his foot has completely resolved.  He does have a little bit of swelling of the base of the first metatarsal but this is nontender to palpation there is no redness no cellulitis no ulcer no drainage.  He does have an ulcer beneath the cuboid and this does probe to bone it is approximately 5 mm in diameter 2 cm deep there is no purulent drainage.  Review of the MRI scan shows a large cystic mass over the base of  the first metatarsal there is also an ulcer over the collapse at the cuboid with increased edema of the cuboid consistent with early osteomyelitis.  The remainder of the Charcot collapse of his foot appears stable.  Imaging: No results found. No images are attached to the encounter.  Labs: Lab Results  Component Value Date   HGBA1C 9.5 (H) 01/01/2020   HGBA1C 10.2 (H) 10/04/2018   HGBA1C (H) 08/15/2008    12.2 (NOTE) The ADA recommends the following therapeutic goal for glycemic control related to Hgb A1c measurement: Goal of therapy: <6.5 Hgb A1c  Reference: American Diabetes Association: Clinical Practice Recommendations 2010, Diabetes Care, 2010, 33: (Suppl  1).   ESRSEDRATE 65 (H) 01/09/2020   CRP 16.3 (H) 01/09/2020   LABURIC 7.2 10/30/2018   REPTSTATUS 11/10/2010 FINAL 11/09/2010   CULT NO GROWTH 11/09/2010     Lab Results  Component Value Date   ALBUMIN 3.2 (L) 01/01/2020   ALBUMIN 3.5 11/13/2019   ALBUMIN 2.9 (L) 10/07/2018   PREALBUMIN 21.9 10/04/2018   LABURIC 7.2 10/30/2018    Lab Results  Component Value Date   MG 1.7 10/06/2018   MG 1.6 (L) 10/04/2018   MG 1.8 10/03/2018   No results found for: [facility]  Lab Results  Component Value Date   PREALBUMIN 21.9 10/04/2018  CBC EXTENDED Latest Ref Rng & Units 01/20/2020 01/09/2020 01/03/2020  WBC 4.0 - 10.5 K/uL 13.0(H) 13.5(H) -  RBC 4.22 - 5.81 Mil/uL 3.71(L) 3.73(L) -  HGB 13.0 - 17.0 g/dL 10.2(L) 10.0(L) 9.2(L)  HCT 39 - 52 % 31.7(L) 32.8(L) 30.8(L)  PLT 150 - 400 K/uL 357.0 387 -  NEUTROABS 1,500 - 7,800 cells/uL - 10,409(H) -  LYMPHSABS 850 - 3,900 cells/uL - 1,931 -     Body mass index is 32.57 kg/m.  Orders:  No orders of the defined types were placed in this encounter.  Meds ordered this encounter  Medications  . sulfamethoxazole-trimethoprim (BACTRIM DS) 800-160 MG tablet    Sig: Take 1 tablet by mouth 2 (two) times daily.    Dispense:  60 tablet    Refill:  0     Procedures: No  procedures performed  Clinical Data: No additional findings.  ROS:  All other systems negative, except as noted in the HPI. Review of Systems  Objective: Vital Signs: Ht 5\' 10"  (1.778 m)   Wt 227 lb (103 kg)   BMI 32.57 kg/m   Specialty Comments:  No specialty comments available.  PMFS History: Patient Active Problem List   Diagnosis Date Noted  . Gastric polyps   . GI bleed 01/01/2020  . Osteomyelitis ([place]) 01/01/2020  . Charcot's joint, left ankle and foot 11/06/2019  . Peripheral arterial disease ([place]) 07/12/2019  . Pain in right elbow 10/30/2018  . Acute kidney injury superimposed on chronic kidney disease ([place]) 10/04/2018  . AKI (acute kidney injury) ([place]) 10/03/2018  . Hypoalbuminemia 10/03/2018  . Hypokalemia 10/03/2018  . Hypothyroidism 10/03/2018  . CKD (chronic kidney disease), stage III ([place])   . Iron deficiency anemia   . Symptomatic anemia 06/29/2016  . Chronic diastolic heart failure ([place]) 01/14/2014  . Stable angina ([place]) 01/14/2014  . Obesity 01/14/2014  . Pulmonary sarcoidosis ([place]) 01/14/2014  . Uncontrolled diabetes mellitus ([place]) 01/14/2014  . Hyperlipidemia 01/14/2014  . Coronary atherosclerosis of native coronary artery   . Sinusitis, chronic 05/30/2011  . Sleep apnea 10/09/2008  . ADRENAL INSUFFICIENCY, HX OF 02/21/2008  . Coronary artery disease 11/20/2007  . Sarcoidosis 02/20/2007  . Diabetes mellitus without complication ([place]) 41/28/7867  . GERD 12/20/2006   Past Medical History:  Diagnosis Date  . Adrenal insufficiency ([place])   . Allergy   . Anemia   . Arthritis    feet   . Broken foot 09/2019   had to wore a boot. Left   . CKD (chronic kidney disease), stage III ([place])   . Coronary artery disease    has stents  . Coronary atherosclerosis of native coronary artery    Proximal LAD, posterior lateral stent widely patent-10/12/11  . Diabetes mellitus    insulin and pills  . Hearing loss    wears hearing aids  . Heart attack  ([place]) 2010  . History of blood transfusion 06/30/2016   [facility] - 2 units transfused  . Hypertension   . OSA (obstructive sleep apnea)    uses VPAC sleep study 2 years done through [facility]. Dr. Marlou Porch arranged study  . Pneumonia    3-4 years ago  . Sarcoid   . Sarcoidosis   . Sleep apnea    uses CPAP nightly  . Thyroid disease   . Type 2 diabetes mellitus (H[facility])     Family History  Problem Relation Age of Onset  . Kidney disease Mother   . Diabetes Mother   . Kidney  cancer Mother   . Heart attack Father   . Asthma Sister   . Anesthesia problems Sister        "Kidney's did not wake up"  . Sarcoidosis Sister   . Sarcoidosis Niece   . Colon cancer Neg Hx   . Colon polyps Neg Hx   . Rectal cancer Neg Hx   . Stomach cancer Neg Hx     Past Surgical History:  Procedure Laterality Date  . APPENDECTOMY    . CATARACT EXTRACTION  2011   bilat  . CHOLECYSTECTOMY  01/25/2011   Procedure: LAPAROSCOPIC CHOLECYSTECTOMY WITH INTRAOPERATIVE CHOLANGIOGRAM;  Surgeon: Judieth Keens, DO;  Location: [facility];  Service: General;  Laterality: N/A;  . COLONOSCOPY     several  . COLONOSCOPY WITH PROPOFOL N/A 01/03/2020   Procedure: COLONOSCOPY WITH PROPOFOL;  Surgeon: Gatha Mayer, MD;  [facility];  Service: Endoscopy;  Laterality: N/A;  . CORONARY ANGIOPLASTY     most recent 11/2009  . CORONARY STENT INTERVENTION N/A 08/07/2018   Procedure: CORONARY STENT INTERVENTION;  Surgeon: Jettie Booze, MD;  Location: [facility];  Service: Cardiovascular;  Laterality: N/A;  . CORONARY STENT PLACEMENT  2009   in LAD and side branch PTCA  . ESOPHAGOGASTRODUODENOSCOPY (EGD) WITH PROPOFOL N/A 01/03/2020   Procedure: ESOPHAGOGASTRODUODENOSCOPY (EGD) WITH PROPOFOL;  Surgeon: Gatha Mayer, MD;  [facility];  Service: Endoscopy;  Laterality: N/A;  . HEMOSTASIS CLIP PLACEMENT  01/03/2020   Procedure: HEMOSTASIS CLIP PLACEMENT;  Surgeon: Gatha Mayer, MD;   [facility];  Service: Endoscopy;;  . INTERCOSTAL NERVE BLOCK  2011, 06/2016   x2. lumbar spine  . LEFT HEART CATHETERIZATION WITH CORONARY ANGIOGRAM Bilateral 10/12/2011   Procedure: LEFT HEART CATHETERIZATION WITH CORONARY ANGIOGRAM;  Surgeon: Candee Furbish, MD;  Location: [facility] CATH [facility];  Service: Cardiovascular;  Laterality: Bilateral;  . LEFT HEART CATHETERIZATION WITH CORONARY ANGIOGRAM N/A 04/01/2014   Procedure: LEFT HEART CATHETERIZATION WITH CORONARY ANGIOGRAM;  Surgeon: Candee Furbish, MD;  Location: [facility] CATH LAB;  Service: Cardiovascular;  Laterality: N/A;  . POLYPECTOMY  01/03/2020   Procedure: POLYPECTOMY;  Surgeon: Gatha Mayer, MD;  [facility];  Service: Endoscopy;;  . RIGHT/LEFT HEART CATH AND CORONARY ANGIOGRAPHY N/A 08/07/2018   Procedure: RIGHT/LEFT HEART CATH AND CORONARY ANGIOGRAPHY;  Surgeon: Jettie Booze, MD;  Location: [facility];  Service: Cardiovascular;  Laterality: N/A;   Social History   Occupational History  . 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 Smoking status: Never Smoker  . Smokeless tobacco: Never Used  Vaping Use  . Vaping Use: Never used  Substance and Sexual Activity  . Alcohol use: No  . Drug use: No  . Sexual activity: Not on file

## 2020-01-23 NOTE — ED Provider Notes (Signed)
[name] EMERGENCY DEPARTMENT Provider Note   CSN: [id number] Arrival date & time: 01/23/20  2245     History Chief Complaint  Patient presents with  . Eye Problem    [name] is a 74 y.o. male history of CKD, CAD status post stents on Plavix, diabetes, here presenting with left arm redness.  Patient states that he was at his first doctor's office today.  He states that there is a lot of wind and he did rub his left eye.  He states that he had some redness of the left eye this afternoon and he took a nap this evening and wife noticed that his left eye is bloodshot red.  Patient denies any blurry vision or double vision.  He states that he got injection in his left eye about a month ago by Dr. Zigmund Daniel.  The history is provided by the patient.       Past Medical History:  Diagnosis Date  . Adrenal insufficiency ([place])   . Allergy   . Anemia   . Arthritis    feet   . Broken foot 09/2019   had to wore a boot. Left   . CKD (chronic kidney disease), stage III ([place])   . Coronary artery disease    has stents  . Coronary atherosclerosis of native coronary artery    Proximal LAD, posterior lateral stent widely patent-10/12/11  . Diabetes mellitus    insulin and pills  . Hearing loss    wears hearing aids  . Heart attack ([place]) 2010  . History of blood transfusion 06/30/2016   [facility] - 2 units transfused  . Hypertension   . OSA (obstructive sleep apnea)    uses VPAC sleep study 2 years done through [facility]. Dr. Marlou Porch arranged study  . Pneumonia    3-4 years ago  . Sarcoid   . Sarcoidosis   . Sleep apnea    uses CPAP nightly  . Thyroid disease   . Type 2 diabetes mellitus [facility])     Patient Active Problem List   Diagnosis Date Noted  . Gastric polyps   . GI bleed 01/01/2020  . Osteomyelitis ([place]) 01/01/2020  . Charcot's joint, left ankle and foot 11/06/2019  . Peripheral arterial disease ([place]) 07/12/2019  . Pain in right elbow 10/30/2018  . Acute  kidney injury superimposed on chronic kidney disease ([place]) 10/04/2018  . AKI (acute kidney injury) ([place]) 10/03/2018  . Hypoalbuminemia 10/03/2018  . Hypokalemia 10/03/2018  . Hypothyroidism 10/03/2018  . CKD (chronic kidney disease), stage III ([place])   . Iron deficiency anemia   . Symptomatic anemia 06/29/2016  . Chronic diastolic heart failure ([place]) 01/14/2014  . Stable angina ([place]) 01/14/2014  . Obesity 01/14/2014  . Pulmonary sarcoidosis ([place]) 01/14/2014  . Uncontrolled diabetes mellitus ([place]) 01/14/2014  . Hyperlipidemia 01/14/2014  . Coronary atherosclerosis of native coronary artery   . Sinusitis, chronic 05/30/2011  . Sleep apnea 10/09/2008  . ADRENAL INSUFFICIENCY, HX OF 02/21/2008  . Coronary artery disease 11/20/2007  . Sarcoidosis 02/20/2007  . Diabetes mellitus without complication ([place]) 78/29/5621  . GERD 12/20/2006    Past Surgical History:  Procedure Laterality Date  . APPENDECTOMY    . CATARACT EXTRACTION  2011   bilat  . CHOLECYSTECTOMY  01/25/2011   Procedure: LAPAROSCOPIC CHOLECYSTECTOMY WITH INTRAOPERATIVE CHOLANGIOGRAM;  Surgeon: Judieth Keens, DO;  Location: [facility];  Service: General;  Laterality: N/A;  . COLONOSCOPY     several  . COLONOSCOPY WITH  PROPOFOL N/A 01/03/2020   Procedure: COLONOSCOPY WITH PROPOFOL;  Surgeon: Gatha Mayer, MD;  [facility];  Service: Endoscopy;  Laterality: N/A;  . CORONARY ANGIOPLASTY     most recent 11/2009  . CORONARY STENT INTERVENTION N/A 08/07/2018   Procedure: CORONARY STENT INTERVENTION;  Surgeon: Jettie Booze, MD;  Location: [facility];  Service: Cardiovascular;  Laterality: N/A;  . CORONARY STENT PLACEMENT  2009   in LAD and side branch PTCA  . ESOPHAGOGASTRODUODENOSCOPY (EGD) WITH PROPOFOL N/A 01/03/2020   Procedure: ESOPHAGOGASTRODUODENOSCOPY (EGD) WITH PROPOFOL;  Surgeon: Gatha Mayer, MD;  [facility];  Service: Endoscopy;  Laterality: N/A;  . HEMOSTASIS CLIP  PLACEMENT  01/03/2020   Procedure: HEMOSTASIS CLIP PLACEMENT;  Surgeon: Gatha Mayer, MD;  [facility];  Service: Endoscopy;;  . INTERCOSTAL NERVE BLOCK  2011, 06/2016   x2. lumbar spine  . LEFT HEART CATHETERIZATION WITH CORONARY ANGIOGRAM Bilateral 10/12/2011   Procedure: LEFT HEART CATHETERIZATION WITH CORONARY ANGIOGRAM;  Surgeon: Candee Furbish, MD;  Location: [facility] CATH LAB;  Service: Cardiovascular;  Laterality: Bilateral;  . LEFT HEART CATHETERIZATION WITH CORONARY ANGIOGRAM N/A 04/01/2014   Procedure: LEFT HEART CATHETERIZATION WITH CORONARY ANGIOGRAM;  Surgeon: Candee Furbish, MD;  Location: [facility] CATH LAB;  Service: Cardiovascular;  Laterality: N/A;  . POLYPECTOMY  01/03/2020   Procedure: POLYPECTOMY;  Surgeon: Gatha Mayer, MD;  [facility];  Service: Endoscopy;;  . RIGHT/LEFT HEART CATH AND CORONARY ANGIOGRAPHY N/A 08/07/2018   Procedure: RIGHT/LEFT HEART CATH AND CORONARY ANGIOGRAPHY;  Surgeon: Jettie Booze, MD;  Location: [facility];  Service: Cardiovascular;  Laterality: N/A;       Family History  Problem Relation Age of Onset  . Kidney disease Mother   . Diabetes Mother   . Kidney cancer Mother   . Heart attack Father   . Asthma Sister   . Anesthesia problems Sister        "Kidney's did not wake up"  . Sarcoidosis Sister   . Sarcoidosis Niece   . Colon cancer Neg Hx   . Colon polyps Neg Hx   . Rectal cancer Neg Hx   . Stomach cancer Neg Hx     Social History   Tobacco Use  . Smoking status: Never Smoker  . Smokeless tobacco: Never Used  Vaping Use  . Vaping Use: Never used  Substance Use Topics  . Alcohol use: No  . Drug use: No    Home Medications Prior to Admission medications   Medication Sig Start Date End Date Taking? Authorizing Provider  acetaminophen (TYLENOL) 500 MG tablet Take 500 mg by mouth daily as needed for moderate pain or headache.     [provider]  albuterol (PROVENTIL) (2.5 MG/3ML) 0.083%  nebulizer solution Take 3 mLs (2.5 mg total) by nebulization every 6 (six) hours as needed for wheezing or shortness of breath. Dx Code D86.0 07/24/14   Elsie Stain, MD  Albuterol Sulfate (PROAIR RESPICLICK) 174 (90 BASE) MCG/ACT AEPB Inhale 2 puffs into the lungs every 6 (six) hours as needed. Patient taking differently: Inhale 2 puffs into the lungs every 6 (six) hours as needed (wheezing & shortness of breath).  07/24/14   Elsie Stain, MD  aspirin EC 81 MG tablet Take 81 mg by mouth every evening.     [provider]  brimonidine (ALPHAGAN) 0.2 % ophthalmic solution Place 1 drop into the left eye daily.  10/21/19   [provider]  Cholecalciferol (VITAMIN D3) 3000 UNITS TABS Take 3,000 Units by mouth daily.    [provider]  clopidogrel (PLAVIX) 75 MG tablet Take 1 tablet (75 mg total) by mouth daily. 04/17/14   Jerline Pain, MD  Coenzyme Q10 (COQ-10) 400 MG CAPS Take 400 mg by mouth every evening.    [provider]  DROPLET INSULIN SYRINGE 31G X 5/16" 1 ML [place]  08/14/18   [provider]  fish oil-omega-3 fatty acids 1000 MG capsule Take 1 g by mouth daily.     [provider]  furosemide (LASIX) 80 MG tablet Take 1 tablet (80 mg total) by mouth daily. 10/07/18   Hongalgi, Lenis Dickinson, MD  glipiZIDE (GLUCOTROL) 10 MG tablet Take 10 mg by mouth every evening.     [provider]  insulin aspart protamine- aspart (NOVOLOG MIX 70/30) (70-30) 100 UNIT/ML injection Inject 0.3 mLs (30 Units total) into the skin 2 (two) times daily with a meal. 10/07/18   Hongalgi, Lenis Dickinson, MD  levothyroxine (SYNTHROID, LEVOTHROID) 50 MCG tablet Take 50 mcg by mouth daily.  06/21/16   [provider]  losartan (COZAAR) 100 MG tablet Take 100 mg by mouth daily.    [provider]  montelukast (SINGULAIR) 10 MG tablet Take 10 mg by mouth daily as needed (allergies).  07/18/18   [provider]  Multiple Vitamin (MULTIVITAMIN WITH  MINERALS) TABS tablet Take 1 tablet by mouth daily. 10/08/18   Hongalgi, Lenis Dickinson, MD  pantoprazole (PROTONIX) 40 MG tablet Take 40 mg by mouth daily. 03/21/18   [provider]  predniSONE (DELTASONE) 10 MG tablet Take 1 tablet (10 mg total) by mouth daily with breakfast. T Patient taking differently: Take 10 mg by mouth daily with breakfast.  05/15/15   Parrett, Fonnie Mu, NP  RABEprazole (ACIPHEX) 20 MG tablet Take 20 mg by mouth daily as needed (acid reflux).  11/06/19   [provider]  rosuvastatin (CRESTOR) 10 MG tablet TAKE 1 TABLET AT BEDTIME Patient taking differently: Take 10 mg by mouth See admin instructions. Takes 1 tablet 2 times a week 07/10/19   Jerline Pain, MD  sulfamethoxazole-trimethoprim (BACTRIM DS) 800-160 MG tablet Take 1 tablet by mouth 2 (two) times daily. 01/23/20   Newt Minion, MD  traMADol (ULTRAM) 50 MG tablet Take 50 mg by mouth every 6 (six) hours as needed for moderate pain.     [provider]  Vitamin D, Ergocalciferol, (DRISDOL) 50000 UNITS CAPS capsule Take 50,000 Units by mouth every Sunday.  12/21/13   [provider]    Allergies    Statins, Hydrocortisone, Miralax [polyethylene glycol], and Metolazone  Review of Systems   Review of Systems  Eyes: Positive for redness.  All other systems reviewed and are negative.   Physical Exam Updated Vital Signs BP (!) 150/60 (BP Location: Right Arm)   Pulse 80   Temp 98.3 F (36.8 C) (Oral)   Resp 18   Ht 5\' 10"  (1.778 m)   Wt 101.6 kg   SpO2 98%   BMI 32.14 kg/m   Physical Exam Vitals and nursing note reviewed.  Constitutional:      Appearance: Normal appearance.  HENT:     Head: Normocephalic.     Nose: Nose normal.     Mouth/Throat:     Mouth: Mucous membranes are moist.  Eyes:     Comments: L eye subconjunctival hemorrhage.  extraocular movements intact.  There is no hyphema.  Patient's left eye pressure is 11.  Under fluorescein stain, there appears to be  corneal abrasion at the 9 o'clock position.  There is no foreign body under the lids.  Cardiovascular:     Rate and Rhythm: Normal rate.  Pulmonary:     Effort: Pulmonary effort is normal.  Musculoskeletal:        General: Normal range of motion.     Cervical back: Normal range of motion.  Skin:    General: Skin is warm.  Neurological:     General: No focal deficit present.     Mental Status: He is alert and oriented to person, place, and time.  Psychiatric:        Mood and Affect: Mood normal.        Behavior: Behavior normal.     ED Results / Procedures / Treatments   Labs (all labs ordered are listed, but only abnormal results are displayed) Labs Reviewed - No data to display  EKG None  Radiology No results found.  Procedures Procedures (including critical care time)  Medications Ordered in ED Medications  trimethoprim-polymyxin b (POLYTRIM) ophthalmic solution 2 drop (has no administration in time range)  tetracaine (PONTOCAINE) 0.5 % ophthalmic solution 2 drop (2 drops Right Eye Given by Other 01/23/20 2256)  fluorescein ophthalmic strip 1 strip (1 strip Right Eye Given 01/23/20 2259)    ED Course  I have reviewed the triage vital signs and the nursing notes.  Pertinent labs & imaging results that were available during my care of the patient were reviewed by me and considered in my medical decision making (see chart for details).    MDM Rules/Calculators/A&P                         [name] is a 74 y.o. male here presenting with subconjunctival hemorrhage.  Patient is on Plavix and did rub his eye which likely caused the hemorrhage.  Patient also may have a corneal abrasion on the lateral aspect.  Patient has no hyphema and no blurry vision.  Patient also has normal eye pressure on the left side.  Will discharge home with Polytrim.  We will have him follow-up with his eye doctor tomorrow for detailed exam.   Final Clinical Impression(s) / ED Diagnoses Final  diagnoses:  Abrasion of left cornea, initial encounter  Subconjunctival bleed, left    Rx / DC Orders ED Discharge Orders    None       Drenda Freeze, MD 01/23/20 2315

## 2020-01-23 NOTE — ED Triage Notes (Signed)
C/o left eye redness and irritation x 4 hrs

## 2020-01-23 NOTE — Discharge Instructions (Signed)
You have a scratch on the cornea and then some bleeding afterwards.   Please use Polytrim 2 drops to the left eye every 8 hrs for the next 5 days.  You need to see your eye doctor tomorrow in the office.  Hold Plavix until you see your eye doctor.  Return to ER if you have worse eye swelling and pain, blurry vision, double vision.

## 2020-01-24 DIAGNOSIS — H1132 Conjunctival hemorrhage, left eye: Secondary | ICD-10-CM | POA: Diagnosis not present

## 2020-01-27 ENCOUNTER — Ambulatory Visit: Payer: Medicare HMO | Admitting: Orthopedic Surgery

## 2020-02-04 DIAGNOSIS — E1142 Type 2 diabetes mellitus with diabetic polyneuropathy: Secondary | ICD-10-CM | POA: Diagnosis not present

## 2020-02-04 DIAGNOSIS — E1165 Type 2 diabetes mellitus with hyperglycemia: Secondary | ICD-10-CM | POA: Diagnosis not present

## 2020-02-04 DIAGNOSIS — M14672 Charcot's joint, left ankle and foot: Secondary | ICD-10-CM | POA: Diagnosis not present

## 2020-02-04 DIAGNOSIS — I739 Peripheral vascular disease, unspecified: Secondary | ICD-10-CM | POA: Diagnosis not present

## 2020-02-07 DIAGNOSIS — I5032 Chronic diastolic (congestive) heart failure: Secondary | ICD-10-CM | POA: Diagnosis not present

## 2020-02-07 DIAGNOSIS — I10 Essential (primary) hypertension: Secondary | ICD-10-CM | POA: Diagnosis not present

## 2020-02-07 DIAGNOSIS — E119 Type 2 diabetes mellitus without complications: Secondary | ICD-10-CM | POA: Diagnosis not present

## 2020-02-07 DIAGNOSIS — E11621 Type 2 diabetes mellitus with foot ulcer: Secondary | ICD-10-CM | POA: Diagnosis not present

## 2020-02-07 DIAGNOSIS — E039 Hypothyroidism, unspecified: Secondary | ICD-10-CM | POA: Diagnosis not present

## 2020-02-07 DIAGNOSIS — E1165 Type 2 diabetes mellitus with hyperglycemia: Secondary | ICD-10-CM | POA: Diagnosis not present

## 2020-02-07 DIAGNOSIS — S91302A Unspecified open wound, left foot, initial encounter: Secondary | ICD-10-CM | POA: Diagnosis not present

## 2020-02-07 DIAGNOSIS — I509 Heart failure, unspecified: Secondary | ICD-10-CM | POA: Diagnosis not present

## 2020-02-07 DIAGNOSIS — T8189XA Other complications of procedures, not elsewhere classified, initial encounter: Secondary | ICD-10-CM | POA: Diagnosis not present

## 2020-02-07 DIAGNOSIS — L97426 Non-pressure chronic ulcer of left heel and midfoot with bone involvement without evidence of necrosis: Secondary | ICD-10-CM | POA: Diagnosis not present

## 2020-02-07 DIAGNOSIS — G4733 Obstructive sleep apnea (adult) (pediatric): Secondary | ICD-10-CM | POA: Diagnosis not present

## 2020-02-07 DIAGNOSIS — E11610 Type 2 diabetes mellitus with diabetic neuropathic arthropathy: Secondary | ICD-10-CM | POA: Diagnosis not present

## 2020-02-07 DIAGNOSIS — E669 Obesity, unspecified: Secondary | ICD-10-CM | POA: Diagnosis not present

## 2020-02-07 DIAGNOSIS — M86172 Other acute osteomyelitis, left ankle and foot: Secondary | ICD-10-CM | POA: Diagnosis not present

## 2020-02-07 DIAGNOSIS — Z794 Long term (current) use of insulin: Secondary | ICD-10-CM | POA: Diagnosis not present

## 2020-02-07 DIAGNOSIS — I110 Hypertensive heart disease with heart failure: Secondary | ICD-10-CM | POA: Diagnosis not present

## 2020-02-07 DIAGNOSIS — I130 Hypertensive heart and chronic kidney disease with heart failure and stage 1 through stage 4 chronic kidney disease, or unspecified chronic kidney disease: Secondary | ICD-10-CM | POA: Diagnosis not present

## 2020-02-07 DIAGNOSIS — I2510 Atherosclerotic heart disease of native coronary artery without angina pectoris: Secondary | ICD-10-CM | POA: Diagnosis not present

## 2020-02-07 DIAGNOSIS — M86272 Subacute osteomyelitis, left ankle and foot: Secondary | ICD-10-CM | POA: Diagnosis not present

## 2020-02-07 DIAGNOSIS — E1169 Type 2 diabetes mellitus with other specified complication: Secondary | ICD-10-CM | POA: Diagnosis not present

## 2020-02-07 DIAGNOSIS — D860 Sarcoidosis of lung: Secondary | ICD-10-CM | POA: Diagnosis not present

## 2020-02-07 DIAGNOSIS — L02612 Cutaneous abscess of left foot: Secondary | ICD-10-CM | POA: Diagnosis not present

## 2020-02-07 DIAGNOSIS — Z7902 Long term (current) use of antithrombotics/antiplatelets: Secondary | ICD-10-CM | POA: Diagnosis not present

## 2020-02-07 DIAGNOSIS — I739 Peripheral vascular disease, unspecified: Secondary | ICD-10-CM | POA: Diagnosis not present

## 2020-02-07 DIAGNOSIS — D869 Sarcoidosis, unspecified: Secondary | ICD-10-CM | POA: Diagnosis not present

## 2020-02-07 DIAGNOSIS — K219 Gastro-esophageal reflux disease without esophagitis: Secondary | ICD-10-CM | POA: Diagnosis not present

## 2020-02-07 DIAGNOSIS — L089 Local infection of the skin and subcutaneous tissue, unspecified: Secondary | ICD-10-CM | POA: Diagnosis not present

## 2020-02-07 DIAGNOSIS — G4730 Sleep apnea, unspecified: Secondary | ICD-10-CM | POA: Diagnosis not present

## 2020-02-07 DIAGNOSIS — E11628 Type 2 diabetes mellitus with other skin complications: Secondary | ICD-10-CM | POA: Diagnosis not present

## 2020-02-07 DIAGNOSIS — Z6832 Body mass index (BMI) 32.0-32.9, adult: Secondary | ICD-10-CM | POA: Diagnosis not present

## 2020-02-08 DIAGNOSIS — D869 Sarcoidosis, unspecified: Secondary | ICD-10-CM | POA: Diagnosis not present

## 2020-02-08 DIAGNOSIS — G4733 Obstructive sleep apnea (adult) (pediatric): Secondary | ICD-10-CM | POA: Diagnosis not present

## 2020-02-08 DIAGNOSIS — E039 Hypothyroidism, unspecified: Secondary | ICD-10-CM | POA: Diagnosis not present

## 2020-02-08 DIAGNOSIS — K219 Gastro-esophageal reflux disease without esophagitis: Secondary | ICD-10-CM | POA: Diagnosis not present

## 2020-02-08 DIAGNOSIS — E11628 Type 2 diabetes mellitus with other skin complications: Secondary | ICD-10-CM | POA: Diagnosis not present

## 2020-02-08 DIAGNOSIS — L089 Local infection of the skin and subcutaneous tissue, unspecified: Secondary | ICD-10-CM | POA: Diagnosis not present

## 2020-02-08 DIAGNOSIS — I10 Essential (primary) hypertension: Secondary | ICD-10-CM | POA: Diagnosis not present

## 2020-02-08 DIAGNOSIS — I2510 Atherosclerotic heart disease of native coronary artery without angina pectoris: Secondary | ICD-10-CM | POA: Diagnosis not present

## 2020-02-09 DIAGNOSIS — E039 Hypothyroidism, unspecified: Secondary | ICD-10-CM | POA: Diagnosis not present

## 2020-02-09 DIAGNOSIS — D869 Sarcoidosis, unspecified: Secondary | ICD-10-CM | POA: Diagnosis not present

## 2020-02-09 DIAGNOSIS — L089 Local infection of the skin and subcutaneous tissue, unspecified: Secondary | ICD-10-CM | POA: Diagnosis not present

## 2020-02-09 DIAGNOSIS — E11628 Type 2 diabetes mellitus with other skin complications: Secondary | ICD-10-CM | POA: Diagnosis not present

## 2020-02-09 DIAGNOSIS — I10 Essential (primary) hypertension: Secondary | ICD-10-CM | POA: Diagnosis not present

## 2020-02-09 DIAGNOSIS — I2510 Atherosclerotic heart disease of native coronary artery without angina pectoris: Secondary | ICD-10-CM | POA: Diagnosis not present

## 2020-02-09 DIAGNOSIS — K219 Gastro-esophageal reflux disease without esophagitis: Secondary | ICD-10-CM | POA: Diagnosis not present

## 2020-02-09 DIAGNOSIS — G4733 Obstructive sleep apnea (adult) (pediatric): Secondary | ICD-10-CM | POA: Diagnosis not present

## 2020-02-10 DIAGNOSIS — G4733 Obstructive sleep apnea (adult) (pediatric): Secondary | ICD-10-CM | POA: Diagnosis not present

## 2020-02-10 DIAGNOSIS — I10 Essential (primary) hypertension: Secondary | ICD-10-CM | POA: Diagnosis not present

## 2020-02-10 DIAGNOSIS — D869 Sarcoidosis, unspecified: Secondary | ICD-10-CM | POA: Diagnosis not present

## 2020-02-10 DIAGNOSIS — E039 Hypothyroidism, unspecified: Secondary | ICD-10-CM | POA: Diagnosis not present

## 2020-02-10 DIAGNOSIS — E11628 Type 2 diabetes mellitus with other skin complications: Secondary | ICD-10-CM | POA: Diagnosis not present

## 2020-02-10 DIAGNOSIS — K219 Gastro-esophageal reflux disease without esophagitis: Secondary | ICD-10-CM | POA: Diagnosis not present

## 2020-02-10 DIAGNOSIS — L089 Local infection of the skin and subcutaneous tissue, unspecified: Secondary | ICD-10-CM | POA: Diagnosis not present

## 2020-02-10 DIAGNOSIS — I2510 Atherosclerotic heart disease of native coronary artery without angina pectoris: Secondary | ICD-10-CM | POA: Diagnosis not present

## 2020-02-11 DIAGNOSIS — L089 Local infection of the skin and subcutaneous tissue, unspecified: Secondary | ICD-10-CM | POA: Diagnosis not present

## 2020-02-11 DIAGNOSIS — D869 Sarcoidosis, unspecified: Secondary | ICD-10-CM | POA: Diagnosis not present

## 2020-02-11 DIAGNOSIS — E11628 Type 2 diabetes mellitus with other skin complications: Secondary | ICD-10-CM | POA: Diagnosis not present

## 2020-02-14 ENCOUNTER — Ambulatory Visit: Payer: Medicare HMO | Admitting: Cardiology

## 2020-02-19 DIAGNOSIS — N1832 Chronic kidney disease, stage 3b: Secondary | ICD-10-CM | POA: Diagnosis not present

## 2020-02-19 DIAGNOSIS — I130 Hypertensive heart and chronic kidney disease with heart failure and stage 1 through stage 4 chronic kidney disease, or unspecified chronic kidney disease: Secondary | ICD-10-CM | POA: Diagnosis not present

## 2020-02-19 DIAGNOSIS — E1142 Type 2 diabetes mellitus with diabetic polyneuropathy: Secondary | ICD-10-CM | POA: Diagnosis not present

## 2020-02-19 DIAGNOSIS — E11610 Type 2 diabetes mellitus with diabetic neuropathic arthropathy: Secondary | ICD-10-CM | POA: Diagnosis not present

## 2020-02-19 DIAGNOSIS — E1169 Type 2 diabetes mellitus with other specified complication: Secondary | ICD-10-CM | POA: Diagnosis not present

## 2020-02-19 DIAGNOSIS — I739 Peripheral vascular disease, unspecified: Secondary | ICD-10-CM | POA: Diagnosis not present

## 2020-02-19 DIAGNOSIS — E1122 Type 2 diabetes mellitus with diabetic chronic kidney disease: Secondary | ICD-10-CM | POA: Diagnosis not present

## 2020-02-19 DIAGNOSIS — I2510 Atherosclerotic heart disease of native coronary artery without angina pectoris: Secondary | ICD-10-CM | POA: Diagnosis not present

## 2020-02-19 DIAGNOSIS — E1151 Type 2 diabetes mellitus with diabetic peripheral angiopathy without gangrene: Secondary | ICD-10-CM | POA: Diagnosis not present

## 2020-02-19 DIAGNOSIS — E11621 Type 2 diabetes mellitus with foot ulcer: Secondary | ICD-10-CM | POA: Diagnosis not present

## 2020-02-19 DIAGNOSIS — L02619 Cutaneous abscess of unspecified foot: Secondary | ICD-10-CM | POA: Diagnosis not present

## 2020-02-19 DIAGNOSIS — I10 Essential (primary) hypertension: Secondary | ICD-10-CM | POA: Diagnosis not present

## 2020-02-19 DIAGNOSIS — M869 Osteomyelitis, unspecified: Secondary | ICD-10-CM | POA: Diagnosis not present

## 2020-02-19 DIAGNOSIS — I5032 Chronic diastolic (congestive) heart failure: Secondary | ICD-10-CM | POA: Diagnosis not present

## 2020-02-19 DIAGNOSIS — Z794 Long term (current) use of insulin: Secondary | ICD-10-CM | POA: Diagnosis not present

## 2020-02-19 DIAGNOSIS — Z48817 Encounter for surgical aftercare following surgery on the skin and subcutaneous tissue: Secondary | ICD-10-CM | POA: Diagnosis not present

## 2020-02-20 ENCOUNTER — Encounter (INDEPENDENT_AMBULATORY_CARE_PROVIDER_SITE_OTHER): Payer: Medicare HMO | Admitting: Ophthalmology

## 2020-02-21 DIAGNOSIS — I5032 Chronic diastolic (congestive) heart failure: Secondary | ICD-10-CM | POA: Diagnosis not present

## 2020-02-21 DIAGNOSIS — I130 Hypertensive heart and chronic kidney disease with heart failure and stage 1 through stage 4 chronic kidney disease, or unspecified chronic kidney disease: Secondary | ICD-10-CM | POA: Diagnosis not present

## 2020-02-21 DIAGNOSIS — N1832 Chronic kidney disease, stage 3b: Secondary | ICD-10-CM | POA: Diagnosis not present

## 2020-02-21 DIAGNOSIS — E1122 Type 2 diabetes mellitus with diabetic chronic kidney disease: Secondary | ICD-10-CM | POA: Diagnosis not present

## 2020-02-21 DIAGNOSIS — I2510 Atherosclerotic heart disease of native coronary artery without angina pectoris: Secondary | ICD-10-CM | POA: Diagnosis not present

## 2020-02-21 DIAGNOSIS — Z48817 Encounter for surgical aftercare following surgery on the skin and subcutaneous tissue: Secondary | ICD-10-CM | POA: Diagnosis not present

## 2020-02-21 DIAGNOSIS — E1169 Type 2 diabetes mellitus with other specified complication: Secondary | ICD-10-CM | POA: Diagnosis not present

## 2020-02-21 DIAGNOSIS — M869 Osteomyelitis, unspecified: Secondary | ICD-10-CM | POA: Diagnosis not present

## 2020-02-21 DIAGNOSIS — E11610 Type 2 diabetes mellitus with diabetic neuropathic arthropathy: Secondary | ICD-10-CM | POA: Diagnosis not present

## 2020-03-03 DIAGNOSIS — M869 Osteomyelitis, unspecified: Secondary | ICD-10-CM | POA: Diagnosis not present

## 2020-03-03 DIAGNOSIS — E11610 Type 2 diabetes mellitus with diabetic neuropathic arthropathy: Secondary | ICD-10-CM | POA: Diagnosis not present

## 2020-03-03 DIAGNOSIS — Z48817 Encounter for surgical aftercare following surgery on the skin and subcutaneous tissue: Secondary | ICD-10-CM | POA: Diagnosis not present

## 2020-03-03 DIAGNOSIS — E1122 Type 2 diabetes mellitus with diabetic chronic kidney disease: Secondary | ICD-10-CM | POA: Diagnosis not present

## 2020-03-03 DIAGNOSIS — I5032 Chronic diastolic (congestive) heart failure: Secondary | ICD-10-CM | POA: Diagnosis not present

## 2020-03-03 DIAGNOSIS — I2510 Atherosclerotic heart disease of native coronary artery without angina pectoris: Secondary | ICD-10-CM | POA: Diagnosis not present

## 2020-03-03 DIAGNOSIS — I130 Hypertensive heart and chronic kidney disease with heart failure and stage 1 through stage 4 chronic kidney disease, or unspecified chronic kidney disease: Secondary | ICD-10-CM | POA: Diagnosis not present

## 2020-03-03 DIAGNOSIS — E1169 Type 2 diabetes mellitus with other specified complication: Secondary | ICD-10-CM | POA: Diagnosis not present

## 2020-03-03 DIAGNOSIS — N1832 Chronic kidney disease, stage 3b: Secondary | ICD-10-CM | POA: Diagnosis not present

## 2020-03-10 DIAGNOSIS — E11610 Type 2 diabetes mellitus with diabetic neuropathic arthropathy: Secondary | ICD-10-CM | POA: Diagnosis not present

## 2020-03-10 DIAGNOSIS — E1122 Type 2 diabetes mellitus with diabetic chronic kidney disease: Secondary | ICD-10-CM | POA: Diagnosis not present

## 2020-03-10 DIAGNOSIS — I130 Hypertensive heart and chronic kidney disease with heart failure and stage 1 through stage 4 chronic kidney disease, or unspecified chronic kidney disease: Secondary | ICD-10-CM | POA: Diagnosis not present

## 2020-03-10 DIAGNOSIS — N1832 Chronic kidney disease, stage 3b: Secondary | ICD-10-CM | POA: Diagnosis not present

## 2020-03-10 DIAGNOSIS — M869 Osteomyelitis, unspecified: Secondary | ICD-10-CM | POA: Diagnosis not present

## 2020-03-10 DIAGNOSIS — E1169 Type 2 diabetes mellitus with other specified complication: Secondary | ICD-10-CM | POA: Diagnosis not present

## 2020-03-10 DIAGNOSIS — I5032 Chronic diastolic (congestive) heart failure: Secondary | ICD-10-CM | POA: Diagnosis not present

## 2020-03-10 DIAGNOSIS — Z48817 Encounter for surgical aftercare following surgery on the skin and subcutaneous tissue: Secondary | ICD-10-CM | POA: Diagnosis not present

## 2020-03-10 DIAGNOSIS — I2510 Atherosclerotic heart disease of native coronary artery without angina pectoris: Secondary | ICD-10-CM | POA: Diagnosis not present

## 2020-03-11 ENCOUNTER — Ambulatory Visit: Payer: Medicare HMO | Admitting: Infectious Disease

## 2020-03-17 DIAGNOSIS — I130 Hypertensive heart and chronic kidney disease with heart failure and stage 1 through stage 4 chronic kidney disease, or unspecified chronic kidney disease: Secondary | ICD-10-CM | POA: Diagnosis not present

## 2020-03-17 DIAGNOSIS — N1832 Chronic kidney disease, stage 3b: Secondary | ICD-10-CM | POA: Diagnosis not present

## 2020-03-17 DIAGNOSIS — I2510 Atherosclerotic heart disease of native coronary artery without angina pectoris: Secondary | ICD-10-CM | POA: Diagnosis not present

## 2020-03-17 DIAGNOSIS — E11610 Type 2 diabetes mellitus with diabetic neuropathic arthropathy: Secondary | ICD-10-CM | POA: Diagnosis not present

## 2020-03-17 DIAGNOSIS — Z48817 Encounter for surgical aftercare following surgery on the skin and subcutaneous tissue: Secondary | ICD-10-CM | POA: Diagnosis not present

## 2020-03-17 DIAGNOSIS — I5032 Chronic diastolic (congestive) heart failure: Secondary | ICD-10-CM | POA: Diagnosis not present

## 2020-03-17 DIAGNOSIS — M869 Osteomyelitis, unspecified: Secondary | ICD-10-CM | POA: Diagnosis not present

## 2020-03-17 DIAGNOSIS — E1169 Type 2 diabetes mellitus with other specified complication: Secondary | ICD-10-CM | POA: Diagnosis not present

## 2020-03-17 DIAGNOSIS — E1122 Type 2 diabetes mellitus with diabetic chronic kidney disease: Secondary | ICD-10-CM | POA: Diagnosis not present

## 2020-03-19 DIAGNOSIS — N1832 Chronic kidney disease, stage 3b: Secondary | ICD-10-CM | POA: Diagnosis not present

## 2020-03-19 DIAGNOSIS — D508 Other iron deficiency anemias: Secondary | ICD-10-CM | POA: Diagnosis not present

## 2020-03-20 DIAGNOSIS — D500 Iron deficiency anemia secondary to blood loss (chronic): Secondary | ICD-10-CM | POA: Diagnosis not present

## 2020-03-20 DIAGNOSIS — M869 Osteomyelitis, unspecified: Secondary | ICD-10-CM | POA: Diagnosis not present

## 2020-03-20 DIAGNOSIS — D860 Sarcoidosis of lung: Secondary | ICD-10-CM | POA: Diagnosis not present

## 2020-03-20 DIAGNOSIS — I739 Peripheral vascular disease, unspecified: Secondary | ICD-10-CM | POA: Diagnosis not present

## 2020-03-20 DIAGNOSIS — I130 Hypertensive heart and chronic kidney disease with heart failure and stage 1 through stage 4 chronic kidney disease, or unspecified chronic kidney disease: Secondary | ICD-10-CM | POA: Diagnosis not present

## 2020-03-20 DIAGNOSIS — E1169 Type 2 diabetes mellitus with other specified complication: Secondary | ICD-10-CM | POA: Diagnosis not present

## 2020-03-20 DIAGNOSIS — E039 Hypothyroidism, unspecified: Secondary | ICD-10-CM | POA: Diagnosis not present

## 2020-03-20 DIAGNOSIS — I5032 Chronic diastolic (congestive) heart failure: Secondary | ICD-10-CM | POA: Diagnosis not present

## 2020-03-20 DIAGNOSIS — L97529 Non-pressure chronic ulcer of other part of left foot with unspecified severity: Secondary | ICD-10-CM | POA: Diagnosis not present

## 2020-03-21 NOTE — Progress Notes (Deleted)
Cardiology Office Note   Date:  03/21/2020   ID:  [name], [name] 08-28-1945, MRN [id number]  PCP:  Shirline Frees, MD  Cardiologist:  Dr. Candee Furbish,    No chief complaint on file.     History of Present Illness: [name] is a 75 y.o. male who presents for 2 week follow up, seen for Dr. Marlou Porch.   [name] has a hx of CAD s/p DES/PCI to LAD and PL branch 2011; DES/PCIx2 to RPDA 08/2018, pulmonary sarcoidosis, hx of GI bleed, CKD, adrenal insufficiency, diastolic CHF, DM2, HTN, OSA, HLD, hypothyroidism, anemia and PAD with left tibial disease followed by Dr. Gwenlyn Found with recs for medical tx   He was most recently seen 10/11/19 at which time he had c/o DOE and exertional chest pain similar to prior anginal symptoms. Given his cardiac disease, initial plan was to pursue LHC however given his renal disease along with recent work up for anemia with concern for GI bleed, plan was to have him fully evaluated for anemia prior to pursing any invasive cardiac testing. Plan discussed with Dr. Marlou Porch who agreed. He was prescribed Imdur with plans to see him back in 2 weeks. He has not been seen by our service since that time.    Today     He knows to go to the emergency room if his symptoms should worsen.             -Decrease losartan to 50 mg daily             -Start isosorbide 30 mg daily             -Continue aspirin, clopidogrel, rosuvastatin             -Follow-up 2 weeks             -Go to ED if symptoms worsen  2. Stage 3a chronic kidney disease Recent creatinine 1.6.  3. Essential hypertension Blood pressure well controlled.  However, I am not certain he would be able to tolerate isosorbide without reducing his dose of losartan.  Losartan will be reduced to 50 mg daily as outlined above.  4. Chronic diastolic heart failure (HCC) NYHA III.  I suspect his shortness of breath is mainly impacted by his anemia and ischemic heart disease.  Volume status appears stable.  Continue  current dose of furosemide.  5. Anemia, unspecified type As noted, he has Hemoccult-positive anemia.  He is being followed by primary care and is being referred to gastroenterology.  We will obtain his CBC from yesterday from his PCP.  At his follow-up in 2 weeks, we can determine if it is safe to proceed with cardiac catheterization based upon his GI work-up. He has not been seen by our service since that time.              Past Medical History:  Diagnosis Date  . Adrenal insufficiency ([place])   . Allergy   . Anemia   . Arthritis    feet   . Broken foot 09/2019   had to wore a boot. Left   . CKD (chronic kidney disease), stage III ([place])   . Coronary artery disease    has stents  . Coronary atherosclerosis of native coronary artery    Proximal LAD, posterior lateral stent widely patent-10/12/11  . Diabetes mellitus    insulin and pills  . Hearing loss    wears hearing aids  . Heart attack (  [name]) 2010  . History of blood transfusion 06/30/2016   [facility] - 2 units transfused  . Hypertension   . OSA (obstructive sleep apnea)    uses VPAC sleep study 2 years done through [facility]. Dr. Marlou Porch arranged study  . Pneumonia    3-4 years ago  . Sarcoid   . Sarcoidosis   . Sleep apnea    uses CPAP nightly  . Thyroid disease   . Type 2 diabetes mellitus ([place])     Past Surgical History:  Procedure Laterality Date  . APPENDECTOMY    . CATARACT EXTRACTION  2011   bilat  . CHOLECYSTECTOMY  01/25/2011   Procedure: LAPAROSCOPIC CHOLECYSTECTOMY WITH INTRAOPERATIVE CHOLANGIOGRAM;  Surgeon: Judieth Keens, DO;  Location: [facility];  Service: General;  Laterality: N/A;  . COLONOSCOPY     several  . COLONOSCOPY WITH PROPOFOL N/A 01/03/2020   Procedure: COLONOSCOPY WITH PROPOFOL;  Surgeon: Gatha Mayer, MD;  Location: [facility];  Service: Endoscopy;  Laterality: N/A;  . CORONARY ANGIOPLASTY     most recent 11/2009  . CORONARY STENT INTERVENTION N/A 08/07/2018   Procedure:  CORONARY STENT INTERVENTION;  Surgeon: Jettie Booze, MD;  Location: [facility];  Service: Cardiovascular;  Laterality: N/A;  . CORONARY STENT PLACEMENT  2009   in LAD and side branch PTCA  . ESOPHAGOGASTRODUODENOSCOPY (EGD) WITH PROPOFOL N/A 01/03/2020   Procedure: ESOPHAGOGASTRODUODENOSCOPY (EGD) WITH PROPOFOL;  Surgeon: Gatha Mayer, MD;  Location: [facility];  Service: Endoscopy;  Laterality: N/A;  . HEMOSTASIS CLIP PLACEMENT  01/03/2020   Procedure: HEMOSTASIS CLIP PLACEMENT;  Surgeon: Gatha Mayer, MD;  Location: [facility];  Service: Endoscopy;;  . INTERCOSTAL NERVE BLOCK  2011, 06/2016   x2. lumbar spine  . LEFT HEART CATHETERIZATION WITH CORONARY ANGIOGRAM Bilateral 10/12/2011   Procedure: LEFT HEART CATHETERIZATION WITH CORONARY ANGIOGRAM;  Surgeon: Candee Furbish, MD;  Location[facility];  Service: Cardiovascular;  Laterality: Bilateral;  . LEFT HEART CATHETERIZATION WITH CORONARY ANGIOGRAM N/A 04/01/2014   Procedure: LEFT HEART CATHETERIZATION WITH CORONARY ANGIOGRAM;  Surgeon: Candee Furbish, MD;  Location: [facility];  Service: Cardiovascular;  Laterality: N/A;  . POLYPECTOMY  01/03/2020   Procedure: POLYPECTOMY;  Surgeon: Gatha Mayer, MD;  Location: [facility];  Service: Endoscopy;;  . RIGHT/LEFT HEART CATH AND CORONARY ANGIOGRAPHY N/A 08/07/2018   Procedure: RIGHT/LEFT HEART CATH AND CORONARY ANGIOGRAPHY;  Surgeon: Jettie Booze, MD;  Location: [facility];  Service: Cardiovascular;  Laterality: N/A;     Current Outpatient Medications  Medication Sig Dispense Refill  . acetaminophen (TYLENOL) 500 MG tablet Take 500 mg by mouth daily as needed for moderate pain or headache.     . albuterol (PROVENTIL) (2.5 MG/3ML) 0.083% nebulizer solution Take 3 mLs (2.5 mg total) by nebulization every 6 (six) hours as needed for wheezing or shortness of breath. Dx Code D86.0 360 mL 5  . Albuterol Sulfate (PROAIR RESPICLICK) 123XX123 (90 BASE) MCG/ACT AEPB  Inhale 2 puffs into the lungs every 6 (six) hours as needed. (Patient taking differently: Inhale 2 puffs into the lungs every 6 (six) hours as needed (wheezing & shortness of breath). ) 1 each 6  . aspirin EC 81 MG tablet Take 81 mg by mouth every evening.     . brimonidine (ALPHAGAN) 0.2 % ophthalmic solution Place 1 drop into the left eye daily.     . Cholecalciferol (VITAMIN D3) 3000 UNITS TABS Take 3,000 Units by mouth daily.    Marland Kitchen  clopidogrel (PLAVIX) 75 MG tablet Take 1 tablet (75 mg total) by mouth daily. 90 tablet 4  . Coenzyme Q10 (COQ-10) 400 MG CAPS Take 400 mg by mouth every evening.    . DROPLET INSULIN SYRINGE 31G X 5/16" 1 ML MISC     . fish oil-omega-3 fatty acids 1000 MG capsule Take 1 g by mouth daily.     . furosemide (LASIX) 80 MG tablet Take 1 tablet (80 mg total) by mouth daily. 30 tablet 0  . glipiZIDE (GLUCOTROL) 10 MG tablet Take 10 mg by mouth every evening.     . insulin aspart protamine- aspart (NOVOLOG MIX 70/30) (70-30) 100 UNIT/ML injection Inject 0.3 mLs (30 Units total) into the skin 2 (two) times daily with a meal.    . levothyroxine (SYNTHROID, LEVOTHROID) 50 MCG tablet Take 50 mcg by mouth daily.     Marland Kitchen losartan (COZAAR) 100 MG tablet Take 100 mg by mouth daily.    . montelukast (SINGULAIR) 10 MG tablet Take 10 mg by mouth daily as needed (allergies).     . Multiple Vitamin (MULTIVITAMIN WITH MINERALS) TABS tablet Take 1 tablet by mouth daily.    . pantoprazole (PROTONIX) 40 MG tablet Take 40 mg by mouth daily.    . predniSONE (DELTASONE) 10 MG tablet Take 1 tablet (10 mg total) by mouth daily with breakfast. T (Patient taking differently: Take 10 mg by mouth daily with breakfast. ) 30 tablet 6  . RABEprazole (ACIPHEX) 20 MG tablet Take 20 mg by mouth daily as needed (acid reflux).     . rosuvastatin (CRESTOR) 10 MG tablet TAKE 1 TABLET AT BEDTIME (Patient taking differently: Take 10 mg by mouth See admin instructions. Takes 1 tablet 2 times a week) 90 tablet 3  .  sulfamethoxazole-trimethoprim (BACTRIM DS) 800-160 MG tablet Take 1 tablet by mouth 2 (two) times daily. 60 tablet 0  . traMADol (ULTRAM) 50 MG tablet Take 50 mg by mouth every 6 (six) hours as needed for moderate pain.     . Vitamin D, Ergocalciferol, (DRISDOL) 50000 UNITS CAPS capsule Take 50,000 Units by mouth every Sunday.   1   No current facility-administered medications for this visit.    Allergies:   Statins, Hydrocortisone, Miralax [polyethylene glycol], and Metolazone    Social History:  The patient  reports that he has never smoked. He has never used smokeless tobacco. He reports that he does not drink alcohol and does not use drugs.   Family History:  The patient's ***family history includes Anesthesia problems in his sister; Asthma in his sister; Diabetes in his mother; Heart attack in his father; Kidney cancer in his mother; Kidney disease in his mother; Sarcoidosis in his niece and sister.    ROS:  Please see the history of present illness.   Otherwise, review of systems are positive for {NONE DEFAULTED:18576::"none"}.   All other systems are reviewed and negative.    PHYSICAL EXAM: VS:  There were no vitals taken for this visit. , BMI There is no height or weight on file to calculate BMI. GEN: Well nourished, well developed, in no acute distress HEENT: normal Neck: no JVD, carotid bruits, or masses Cardiac: ***RRR; no murmurs, rubs, or gallops,no edema  Respiratory:  clear to auscultation bilaterally, normal work of breathing GI: soft, nontender, nondistended, + BS MS: no deformity or atrophy Skin: warm and dry, no rash Neuro:  Strength and sensation are intact Psych: euthymic mood, full affect   EKG:  EKG {ACTION;  IS/IS XKG:[id number]} ordered today. The ekg ordered today demonstrates ***   Recent Labs: 01/01/2020: ALT 36 01/09/2020: BUN 32; Creat 2.17; Potassium 4.5; Sodium 137 01/20/2020: Hemoglobin 10.2; Platelets 357.0    Lipid Panel    Component Value  Date/Time   CHOL 219 (H) 07/27/2018 1239   TRIG 269 (H) 07/27/2018 1239   HDL 32 (L) 07/27/2018 1239   CHOLHDL 6.8 (H) 07/27/2018 1239   CHOLHDL 11.4 08/15/2008 0408   VLDL 41 (H) 08/15/2008 0408   LDLCALC 133 (H) 07/27/2018 1239      Wt Readings from Last 3 Encounters:  01/23/20 224 lb (101.6 kg)  01/23/20 227 lb (103 kg)  01/09/20 227 lb (103 kg)      Other studies Reviewed: Additional studies/ records that were reviewed today include: ***. Review of the above records demonstrates: ***  R/L cardiac catheterization 08/07/2018 LAD proximal stent patent with 40 ISR LCx mid 50 RCA proximal 25; RPDA ostial 80, 70; RP AV stent patent Mean PA 18, mean PCWP 12 PCI: 2.25 x 8 mm Resolute Onyx DES and 2 x 3 mm Resolute Onyx DES to the RPDA  Echocardiogram 07/27/2018 EF 60-65, mild LVH, GR 1 DD, normal RVSF    ASSESSMENT AND PLAN:  1.  ***   Current medicines are reviewed at length with the patient today.  The patient {ACTIONS; HAS/DOES NOT HAVE:19233} concerns regarding medicines.  The following changes have been made:  {PLAN; NO CHANGE:13088:s}  Labs/ tests ordered today include: *** No orders of the defined types were placed in this encounter.    Disposition:   FU with *** in {gen number 9-70:[id number]} {Days to years:10300}  Signed, Kathyrn Drown, NP  03/21/2020 8:48 AM    [facility] [address], [place], [place]  [address] Phone: [phone]; Fax: [phone]

## 2020-03-25 ENCOUNTER — Ambulatory Visit: Payer: Medicare HMO | Admitting: Cardiology

## 2020-03-30 ENCOUNTER — Other Ambulatory Visit: Payer: Self-pay | Admitting: Orthopedic Surgery

## 2020-03-31 DIAGNOSIS — Z48817 Encounter for surgical aftercare following surgery on the skin and subcutaneous tissue: Secondary | ICD-10-CM | POA: Diagnosis not present

## 2020-03-31 DIAGNOSIS — M869 Osteomyelitis, unspecified: Secondary | ICD-10-CM | POA: Diagnosis not present

## 2020-03-31 DIAGNOSIS — E11610 Type 2 diabetes mellitus with diabetic neuropathic arthropathy: Secondary | ICD-10-CM | POA: Diagnosis not present

## 2020-03-31 DIAGNOSIS — N1832 Chronic kidney disease, stage 3b: Secondary | ICD-10-CM | POA: Diagnosis not present

## 2020-03-31 DIAGNOSIS — E1169 Type 2 diabetes mellitus with other specified complication: Secondary | ICD-10-CM | POA: Diagnosis not present

## 2020-03-31 DIAGNOSIS — I5032 Chronic diastolic (congestive) heart failure: Secondary | ICD-10-CM | POA: Diagnosis not present

## 2020-03-31 DIAGNOSIS — I130 Hypertensive heart and chronic kidney disease with heart failure and stage 1 through stage 4 chronic kidney disease, or unspecified chronic kidney disease: Secondary | ICD-10-CM | POA: Diagnosis not present

## 2020-03-31 DIAGNOSIS — I2510 Atherosclerotic heart disease of native coronary artery without angina pectoris: Secondary | ICD-10-CM | POA: Diagnosis not present

## 2020-03-31 DIAGNOSIS — E1122 Type 2 diabetes mellitus with diabetic chronic kidney disease: Secondary | ICD-10-CM | POA: Diagnosis not present

## 2020-04-16 DIAGNOSIS — M1990 Unspecified osteoarthritis, unspecified site: Secondary | ICD-10-CM | POA: Diagnosis not present

## 2020-04-24 DIAGNOSIS — M25561 Pain in right knee: Secondary | ICD-10-CM | POA: Diagnosis not present

## 2020-04-24 DIAGNOSIS — M2351 Chronic instability of knee, right knee: Secondary | ICD-10-CM | POA: Diagnosis not present

## 2020-04-29 DIAGNOSIS — C4442 Squamous cell carcinoma of skin of scalp and neck: Secondary | ICD-10-CM | POA: Diagnosis not present

## 2020-05-13 DIAGNOSIS — I10 Essential (primary) hypertension: Secondary | ICD-10-CM | POA: Diagnosis not present

## 2020-05-14 DIAGNOSIS — M1990 Unspecified osteoarthritis, unspecified site: Secondary | ICD-10-CM | POA: Diagnosis not present

## 2020-05-19 DIAGNOSIS — M1711 Unilateral primary osteoarthritis, right knee: Secondary | ICD-10-CM | POA: Diagnosis not present

## 2020-05-21 DIAGNOSIS — Z4789 Encounter for other orthopedic aftercare: Secondary | ICD-10-CM | POA: Diagnosis not present

## 2020-05-25 DIAGNOSIS — L820 Inflamed seborrheic keratosis: Secondary | ICD-10-CM | POA: Diagnosis not present

## 2020-05-25 DIAGNOSIS — X32XXXD Exposure to sunlight, subsequent encounter: Secondary | ICD-10-CM | POA: Diagnosis not present

## 2020-05-25 DIAGNOSIS — Z08 Encounter for follow-up examination after completed treatment for malignant neoplasm: Secondary | ICD-10-CM | POA: Diagnosis not present

## 2020-05-25 DIAGNOSIS — L570 Actinic keratosis: Secondary | ICD-10-CM | POA: Diagnosis not present

## 2020-05-25 DIAGNOSIS — Z85828 Personal history of other malignant neoplasm of skin: Secondary | ICD-10-CM | POA: Diagnosis not present

## 2020-05-28 DIAGNOSIS — H903 Sensorineural hearing loss, bilateral: Secondary | ICD-10-CM | POA: Diagnosis not present

## 2020-05-29 DIAGNOSIS — E11610 Type 2 diabetes mellitus with diabetic neuropathic arthropathy: Secondary | ICD-10-CM | POA: Diagnosis not present

## 2020-05-29 DIAGNOSIS — S90822A Blister (nonthermal), left foot, initial encounter: Secondary | ICD-10-CM | POA: Diagnosis not present

## 2020-06-01 DIAGNOSIS — M14672 Charcot's joint, left ankle and foot: Secondary | ICD-10-CM | POA: Diagnosis not present

## 2020-06-01 DIAGNOSIS — Z8631 Personal history of diabetic foot ulcer: Secondary | ICD-10-CM | POA: Diagnosis not present

## 2020-06-01 DIAGNOSIS — E1142 Type 2 diabetes mellitus with diabetic polyneuropathy: Secondary | ICD-10-CM | POA: Diagnosis not present

## 2020-06-10 DIAGNOSIS — M1711 Unilateral primary osteoarthritis, right knee: Secondary | ICD-10-CM | POA: Diagnosis not present

## 2020-06-14 DIAGNOSIS — M1990 Unspecified osteoarthritis, unspecified site: Secondary | ICD-10-CM | POA: Diagnosis not present

## 2020-06-18 DIAGNOSIS — D508 Other iron deficiency anemias: Secondary | ICD-10-CM | POA: Diagnosis not present

## 2020-06-18 DIAGNOSIS — E1142 Type 2 diabetes mellitus with diabetic polyneuropathy: Secondary | ICD-10-CM | POA: Diagnosis not present

## 2020-06-18 DIAGNOSIS — E7800 Pure hypercholesterolemia, unspecified: Secondary | ICD-10-CM | POA: Diagnosis not present

## 2020-06-18 DIAGNOSIS — E039 Hypothyroidism, unspecified: Secondary | ICD-10-CM | POA: Diagnosis not present

## 2020-06-18 DIAGNOSIS — M1711 Unilateral primary osteoarthritis, right knee: Secondary | ICD-10-CM | POA: Diagnosis not present

## 2020-06-18 DIAGNOSIS — I10 Essential (primary) hypertension: Secondary | ICD-10-CM | POA: Diagnosis not present

## 2020-06-18 DIAGNOSIS — N183 Chronic kidney disease, stage 3 unspecified: Secondary | ICD-10-CM | POA: Diagnosis not present

## 2020-06-18 DIAGNOSIS — D860 Sarcoidosis of lung: Secondary | ICD-10-CM | POA: Diagnosis not present

## 2020-06-18 DIAGNOSIS — I5032 Chronic diastolic (congestive) heart failure: Secondary | ICD-10-CM | POA: Diagnosis not present

## 2020-06-29 NOTE — Progress Notes (Signed)
Cardiology Office Note    Date:  07/01/2020   ID:  [name], [name] 1945/09/26, MRN [id number]   PCP:  Shirline Frees, [place]  Cardiologist:  Candee Furbish, MD  Advanced Practice Provider:  No care team member to display Electrophysiologist:  None   [id number]   Chief Complaint  Patient presents with  . Shortness of Breath  . Leg Swelling    History of Present Illness:  [name] is a 75 y.o. male with history of CAD status post DES to the LAD and PLA 10/2009, DES x2 to the [place] 08/2018, echo 07/2018 LVEF 60 to 65% with grade 1 DD, diastolic CHF, pulmonary sarcoidosis, history of GI bleed, adrenal insufficiency on chronic steroids, hypertension, HLD, PAD with left tibial disease treated medically, OSA, CKD.  Patient was last seen by [name], PA-C 10/11/2019 having symptoms of dyspnea on exertion and chest pain but was also Hemoccult positive and having a GI work-up.  He decrease losartan to 50 mg daily and started isosorbide 30 mg daily.  Patient was admitted with a GI bleed 12/2019 requiring transfusion.  Endoscopy showed small amount of fresh blood in the duodenal bulb but no bleeding site and 2 small polyps were removed.  Colonoscopy no active bleeding.  They resumed his Plavix and aspirin.   Has since had 2 surgeries on his left foot at [facility] in December for charcot's and osteomyelitis. HR was up and down during the second foot surgery and was in ICU for a couple days. Didn't change his meds. Now with right knee problems.  Complains of chest pain and DOE with little activity for about 2 months. Hgb dropped from 12.9 to 9.3 on report he got yesterday. Iron is low and may need Iron infusion. He didn't tolerate Imdur-says it made his chest hurt worse. Was at beach last week and ate out. Swollen ankles/legs since. A1C over 10. On chronic steroids for his lungs. Using walker and knee brace.    Past Medical History:  Diagnosis Date  . Adrenal  insufficiency ([place])   . Allergy   . Anemia   . Arthritis    feet   . Broken foot 09/2019   had to wore a boot. Left   . CKD (chronic kidney disease), stage III ([place])   . Coronary artery disease    has stents  . Coronary atherosclerosis of native coronary artery    Proximal LAD, posterior lateral stent widely patent-10/12/11  . Diabetes mellitus    insulin and pills  . Hearing loss    wears hearing aids  . Heart attack ([place]) 2010  . History of blood transfusion 06/30/2016   [facility] - 2 units transfused  . Hypertension   . OSA (obstructive sleep apnea)    uses VPAC sleep study 2 years done through [facility]. Dr. Marlou Porch arranged study  . Pneumonia    3-4 years ago  . Sarcoid   . Sarcoidosis   . Sleep apnea    uses CPAP nightly  . Thyroid disease   . Type 2 diabetes mellitus ([place])     Past Surgical History:  Procedure Laterality Date  . APPENDECTOMY    . CATARACT EXTRACTION  2011   bilat  . CHOLECYSTECTOMY  01/25/2011   Procedure: LAPAROSCOPIC CHOLECYSTECTOMY WITH INTRAOPERATIVE CHOLANGIOGRAM;  Surgeon: Judieth Keens, DO;  Location: [facility];  Service: General;  Laterality: N/A;  . COLONOSCOPY     several  .  COLONOSCOPY WITH PROPOFOL N/A 01/03/2020   Procedure: COLONOSCOPY WITH PROPOFOL;  Surgeon: Gatha Mayer, MD;  Location: [facility];  Service: Endoscopy;  Laterality: N/A;  . CORONARY ANGIOPLASTY     most recent 11/2009  . CORONARY STENT INTERVENTION N/A 08/07/2018   Procedure: CORONARY STENT INTERVENTION;  Surgeon: Jettie Booze, MD;  Location: [facility];  Service: Cardiovascular;  Laterality: N/A;  . CORONARY STENT PLACEMENT  2009   in LAD and side branch PTCA  . ESOPHAGOGASTRODUODENOSCOPY (EGD) WITH PROPOFOL N/A 01/03/2020   Procedure: ESOPHAGOGASTRODUODENOSCOPY (EGD) WITH PROPOFOL;  Surgeon: Gatha Mayer, MD;  Location: [facility];  Service: Endoscopy;  Laterality: N/A;  . HEMOSTASIS CLIP PLACEMENT  01/03/2020   Procedure: HEMOSTASIS CLIP  PLACEMENT;  Surgeon: Gatha Mayer, MD;  Location: [facility];  Service: Endoscopy;;  . INTERCOSTAL NERVE BLOCK  2011, 06/2016   x2. lumbar spine  . LEFT HEART CATHETERIZATION WITH CORONARY ANGIOGRAM Bilateral 10/12/2011   Procedure: LEFT HEART CATHETERIZATION WITH CORONARY ANGIOGRAM;  Surgeon: Candee Furbish, MD;  Location: [facility] CATH LAB;  Service: Cardiovascular;  Laterality: Bilateral;  . LEFT HEART CATHETERIZATION WITH CORONARY ANGIOGRAM N/A 04/01/2014   Procedure: LEFT HEART CATHETERIZATION WITH CORONARY ANGIOGRAM;  Surgeon: Candee Furbish, MD;  Location: [facility] LAB;  Service: Cardiovascular;  Laterality: N/A;  . POLYPECTOMY  01/03/2020   Procedure: POLYPECTOMY;  Surgeon: Gatha Mayer, MD;  Location: [facility];  Service: Endoscopy;;  . RIGHT/LEFT HEART CATH AND CORONARY ANGIOGRAPHY N/A 08/07/2018   Procedure: RIGHT/LEFT HEART CATH AND CORONARY ANGIOGRAPHY;  Surgeon: Jettie Booze, MD;  Location: [facility];  Service: Cardiovascular;  Laterality: N/A;    Current Medications: Current Meds  Medication Sig  . acetaminophen (TYLENOL) 500 MG tablet Take 500 mg by mouth daily as needed for moderate pain or headache.   . albuterol (PROVENTIL) (2.5 MG/3ML) 0.083% nebulizer solution Take 3 mLs (2.5 mg total) by nebulization every 6 (six) hours as needed for wheezing or shortness of breath. Dx Code D86.0  . Albuterol Sulfate (PROAIR RESPICLICK) 123XX123 (90 BASE) MCG/ACT AEPB Inhale 2 puffs into the lungs every 6 (six) hours as needed. (Patient taking differently: Inhale 2 puffs into the lungs every 6 (six) hours as needed (wheezing & shortness of breath).)  . amLODipine (NORVASC) 5 MG tablet Take 1 tablet (5 mg total) by mouth daily.  Marland Kitchen aspirin EC 81 MG tablet Take 81 mg by mouth every evening.   . brimonidine (ALPHAGAN) 0.2 % ophthalmic solution Place 1 drop into the left eye daily.   . Cholecalciferol (VITAMIN D3) 3000 UNITS TABS Take 3,000 Units by mouth daily.  . clopidogrel (PLAVIX) 75  MG tablet Take 1 tablet (75 mg total) by mouth daily.  . Coenzyme Q10 (COQ-10) 400 MG CAPS Take 400 mg by mouth every evening.  . DROPLET INSULIN SYRINGE 31G X 5/16" 1 ML MISC   . fish oil-omega-3 fatty acids 1000 MG capsule Take 1 g by mouth daily.   . furosemide (LASIX) 80 MG tablet Take 1 tablet (80 mg total) by mouth daily.  Marland Kitchen glipiZIDE (GLUCOTROL) 10 MG tablet Take 10 mg by mouth every evening.  . insulin aspart protamine- aspart (NOVOLOG MIX 70/30) (70-30) 100 UNIT/ML injection Inject 0.3 mLs (30 Units total) into the skin 2 (two) times daily with a meal.  . levothyroxine (SYNTHROID, LEVOTHROID) 50 MCG tablet Take 50 mcg by mouth daily.   Marland Kitchen losartan (COZAAR) 100 MG tablet Take 100 mg by mouth daily.  Marland Kitchen  montelukast (SINGULAIR) 10 MG tablet Take 10 mg by mouth daily as needed (allergies).   . Multiple Vitamin (MULTIVITAMIN WITH MINERALS) TABS tablet Take 1 tablet by mouth daily.  . nitroGLYCERIN (NITROSTAT) 0.4 MG SL tablet Place 1 tablet (0.4 mg total) under the tongue every 5 (five) minutes as needed for chest pain.  . pantoprazole (PROTONIX) 40 MG tablet Take 40 mg by mouth daily.  . predniSONE (DELTASONE) 10 MG tablet Take 1 tablet (10 mg total) by mouth daily with breakfast. T (Patient taking differently: Take 10 mg by mouth daily with breakfast.)  . RABEprazole (ACIPHEX) 20 MG tablet Take 20 mg by mouth daily as needed (acid reflux).   . rosuvastatin (CRESTOR) 10 MG tablet TAKE 1 TABLET AT BEDTIME (Patient taking differently: Take 10 mg by mouth See admin instructions. Takes 1 tablet 2 times a week)  . sulfamethoxazole-trimethoprim (BACTRIM DS) 800-160 MG tablet Take 1 tablet by mouth 2 (two) times daily.  . traMADol (ULTRAM) 50 MG tablet Take 50 mg by mouth every 6 (six) hours as needed for moderate pain.   . Vitamin D, Ergocalciferol, (DRISDOL) 50000 UNITS CAPS capsule Take 50,000 Units by mouth every Sunday.      Allergies:   Statins, Hydrocortisone, Miralax [polyethylene glycol],  and Metolazone   Social History   Socioeconomic History  . Marital status: Married    Spouse name: Not on file  . Number of children: 3  . Years of education: Not on file  . Highest education level: Not on file  Occupational History  . 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 Smoking status: Never Smoker  . Smokeless tobacco: Never Used  Vaping Use  . Vaping Use: Never used  Substance and Sexual Activity  . Alcohol use: No  . Drug use: No  . Sexual activity: Not on file  Other Topics Concern  . Not on file  Social History Narrative  . Not on file   Social Determinants of Health   Financial Resource Strain: Not on file  Food Insecurity: Not on file  Transportation Needs: Not on file  Physical Activity: Not on file  Stress: Not on file  Social Connections: Not on file     Family History:  The patient's family history includes Anesthesia problems in his sister; Asthma in his sister; Diabetes in his mother; Heart attack in his father; Kidney cancer in his mother; Kidney disease in his mother; Sarcoidosis in his niece and sister.   ROS:   Please see the history of present illness.    ROS All other systems reviewed and are negative.   PHYSICAL EXAM:   VS:  BP (!) 142/68   Pulse 72   Ht 5\' 10"  (1.778 m)   Wt 234 lb (106.1 kg)   SpO2 96%   BMI 33.58 kg/m   Physical Exam  GEN: Obese, in no acute distress  Neck: no JVD, carotid bruits, or masses Cardiac:RRR; no murmurs, rubs, or gallops  Respiratory:  clear to auscultation bilaterally, normal work of breathing GI: soft, nontender, nondistended, + BS Ext: plus 2 edema bilaterally otherwise without cyanosis, clubbing,  Good distal pulses bilaterally Neuro:  Alert and Oriented x 3 Psych: euthymic mood, full affect  Wt Readings from Last 3 Encounters:  07/01/20 234 lb (106.1 kg)  01/23/20 224 lb (101.6 kg)  01/23/20 227 lb (103 kg)       Studies/Labs Reviewed:  EKG:  EKG is  ordered today.  The ekg ordered today demonstrates NSR LAFB no acute change  Recent Labs: 01/01/2020: ALT 36 01/09/2020: BUN 32; Creat 2.17; Potassium 4.5; Sodium 137 01/20/2020: Hemoglobin 10.2; Platelets 357.0   Lipid Panel    Component Value Date/Time   CHOL 219 (H) 07/27/2018 1239   TRIG 269 (H) 07/27/2018 1239   HDL 32 (L) 07/27/2018 1239   CHOLHDL 6.8 (H) 07/27/2018 1239   CHOLHDL 11.4 08/15/2008 0408   VLDL 41 (H) 08/15/2008 0408   LDLCALC 133 (H) 07/27/2018 1239    Additional studies/ records that were reviewed today include:  R/L cardiac catheterization 08/07/2018 LAD proximal stent patent with 40 ISR LCx mid 50 RCA proximal 25; RPDA ostial 80, 70; RP AV stent patent Mean PA 18, mean PCWP 12 PCI: 2.25 x 8 mm Resolute Onyx DES and 2 x 3 mm Resolute Onyx DES to the RPDA   Echocardiogram 07/27/2018 EF 60-65, mild LVH, GR 1 DD, normal RVSF      Risk Assessment/Calculations:         ASSESSMENT:    1. Coronary artery disease involving native coronary artery of native heart without angina pectoris   2. Acute on chronic diastolic CHF (congestive heart failure) ([place])   3. Essential hypertension   4. Stage 3a chronic kidney disease ([place])   5. Gastrointestinal hemorrhage associated with duodenal ulcer      PLAN:  In order of problems listed above:  CAD status post DES to the LAD and PLA 10/2009, DES x2 to the RPDA 08/2018 normal LVEF 60 to 65% with grade 1 DD on echo 07/2018. Increase chest pain and DOE with little activity the past 2 months. Hgb down 9.3 from 12.9 on report yest and fluid overload today. Will increase lasix for 2 days, refer back to GI and add Norvasc 5 mg daily. F/u Dr. Marlou Porch to see cath needed. Increased risk with CKD.  Acute on chronic Diastolic CHF-ate out at the beach last week. Increase lasix 120 mg for 2 days then back to 80 mg daily.  HTN BP up today. Add amlodipine  CKD  Crt 1.45 06/18/20  GI  bleed 12/2019 underwent endoscopy and colonoscopy as well as blood transfusion.  No definitive source of bleed found and Plavix and aspirin resumed. Hgb dropped recently to 9.3 and increased chest pain and DOE. Will refer back to GI    Shared Decision Making/Informed Consent        Medication Adjustments/Labs and Tests Ordered: Current medicines are reviewed at length with the patient today.  Concerns regarding medicines are outlined above.  Medication changes, Labs and Tests ordered today are listed in the Patient Instructions below. Patient Instructions  Medication Instructions:  1) Start Amlodipine 5 mg daily   2) Take a extra dose of Lasix today and tomorrow   3) A new prescription for Nitroglycerin has been sent to your pharmacy, you may use 1 tablet every 5 minutes as needed for chest pain  *If you need a refill on your cardiac medications before your next appointment, please call your pharmacy*   Lab Work: None ordered   If you have labs (blood work) drawn today and your tests are completely normal, you will receive your results only by: Marland Kitchen MyChart Message (if you have MyChart) OR . A paper copy in the mail If you have any lab test that is abnormal or we need to change your treatment, we will call you to review the results.  Testing/Procedures: None ordered    Follow-Up: Follow up with Dr. Candee Furbish on 07/27/2020 @ 2:20 PM   Follow up with Dr. Carlean Purl for your Anemia   Other Instructions None     Signed, Ermalinda Barrios, PA-C  07/01/2020 9:47 AM    [address], [place], [place]  [address] Phone: [phone]; Fax: [phone]

## 2020-06-30 DIAGNOSIS — M1711 Unilateral primary osteoarthritis, right knee: Secondary | ICD-10-CM | POA: Diagnosis not present

## 2020-07-01 ENCOUNTER — Encounter: Payer: Self-pay | Admitting: Physician Assistant

## 2020-07-01 ENCOUNTER — Ambulatory Visit: Payer: Medicare HMO | Admitting: Physician Assistant

## 2020-07-01 ENCOUNTER — Other Ambulatory Visit: Payer: Self-pay

## 2020-07-01 VITALS — BP 142/68 | HR 72 | Ht 70.0 in | Wt 234.0 lb

## 2020-07-01 DIAGNOSIS — I5033 Acute on chronic diastolic (congestive) heart failure: Secondary | ICD-10-CM

## 2020-07-01 DIAGNOSIS — K264 Chronic or unspecified duodenal ulcer with hemorrhage: Secondary | ICD-10-CM | POA: Diagnosis not present

## 2020-07-01 DIAGNOSIS — H903 Sensorineural hearing loss, bilateral: Secondary | ICD-10-CM | POA: Diagnosis not present

## 2020-07-01 DIAGNOSIS — I10 Essential (primary) hypertension: Secondary | ICD-10-CM

## 2020-07-01 DIAGNOSIS — I2510 Atherosclerotic heart disease of native coronary artery without angina pectoris: Secondary | ICD-10-CM

## 2020-07-01 DIAGNOSIS — N1831 Chronic kidney disease, stage 3a: Secondary | ICD-10-CM | POA: Diagnosis not present

## 2020-07-01 MED ORDER — NITROGLYCERIN 0.4 MG SL SUBL: 0.4000 mg | SUBLINGUAL_TABLET | SUBLINGUAL | 3 refills | Status: DC | PRN

## 2020-07-01 MED ORDER — AMLODIPINE BESYLATE 5 MG PO TABS: 5.0000 mg | ORAL_TABLET | Freq: Every day | ORAL | 3 refills | Status: DC

## 2020-07-01 NOTE — Patient Instructions (Addendum)
Medication Instructions:  1) Start Amlodipine 5 mg daily   2) Take a extra dose of Lasix today and tomorrow   3) A new prescription for Nitroglycerin has been sent to your pharmacy, you may use 1 tablet every 5 minutes as needed for chest pain  *If you need a refill on your cardiac medications before your next appointment, please call your pharmacy*   Lab Work: None ordered   If you have labs (blood work) drawn today and your tests are completely normal, you will receive your results only by: Marland Kitchen MyChart Message (if you have MyChart) OR . A paper copy in the mail If you have any lab test that is abnormal or we need to change your treatment, we will call you to review the results.   Testing/Procedures: None ordered    Follow-Up: Follow up with Dr. Candee Furbish on 07/27/2020 @ 2:20 PM   Follow up with Dr. Carlean Purl for your Anemia   Other Instructions None

## 2020-07-02 DIAGNOSIS — E11621 Type 2 diabetes mellitus with foot ulcer: Secondary | ICD-10-CM | POA: Diagnosis not present

## 2020-07-02 DIAGNOSIS — E1142 Type 2 diabetes mellitus with diabetic polyneuropathy: Secondary | ICD-10-CM | POA: Diagnosis not present

## 2020-07-02 DIAGNOSIS — I739 Peripheral vascular disease, unspecified: Secondary | ICD-10-CM | POA: Diagnosis not present

## 2020-07-02 DIAGNOSIS — L02619 Cutaneous abscess of unspecified foot: Secondary | ICD-10-CM | POA: Diagnosis not present

## 2020-07-02 DIAGNOSIS — E11610 Type 2 diabetes mellitus with diabetic neuropathic arthropathy: Secondary | ICD-10-CM | POA: Diagnosis not present

## 2020-07-13 DIAGNOSIS — E11610 Type 2 diabetes mellitus with diabetic neuropathic arthropathy: Secondary | ICD-10-CM | POA: Diagnosis not present

## 2020-07-13 DIAGNOSIS — S90822A Blister (nonthermal), left foot, initial encounter: Secondary | ICD-10-CM | POA: Diagnosis not present

## 2020-07-14 DIAGNOSIS — M1990 Unspecified osteoarthritis, unspecified site: Secondary | ICD-10-CM | POA: Diagnosis not present

## 2020-07-21 ENCOUNTER — Ambulatory Visit: Payer: Medicare HMO | Admitting: Gastroenterology

## 2020-07-21 ENCOUNTER — Encounter: Payer: Self-pay | Admitting: Gastroenterology

## 2020-07-21 VITALS — BP 130/72 | HR 62 | Ht 70.0 in | Wt 229.1 lb

## 2020-07-21 DIAGNOSIS — K317 Polyp of stomach and duodenum: Secondary | ICD-10-CM

## 2020-07-21 DIAGNOSIS — D509 Iron deficiency anemia, unspecified: Secondary | ICD-10-CM | POA: Diagnosis not present

## 2020-07-21 NOTE — Progress Notes (Signed)
07/21/2020 GERLAD BIGELOW [id number] 02-27-1946   HISTORY OF PRESENT ILLNESS:  This is a 75 year old male who is a patient of Dr. Celesta Aver  He has PMH of CAD status post DES to the LAD and PLA 10/2009, DES x2 to the [place] 08/2018, echo 07/2018 LVEF 60 to 65% with grade 1 DD, diastolic CHF, pulmonary sarcoidosis, history of GI bleed, adrenal insufficiency on chronic steroids, hypertension, HLD, PAD with left tibial disease treated medically, OSA, CKD, and recurrent IDA.  He was evaluated by our service for IDA and GI bleed in 12/2019.  He underwent colonoscopy and EGD as follows:  Colonoscopy: - A tattoo was seen in the transverse colon. A post-polypectomy scar was found at the tattoo site. - The examination was otherwise normal on direct and retroflexion views. - No specimens collected.  EGD: - Two gastric polyps. Resected and retrieved. Clips (MR conditional) were placed. - Blood in the duodenal bulb. - The examination was otherwise normal.  Pathology: A. STOMACH, POLYPECTOMY:  - Hyperplastic polyp(s).  - No intestinal metaplasia, dysplasia, or malignancy.   Since then he had 2 surgeries on his left foot at [facility] in December for charcot's and osteomyelitis.  He is still recovering from those.  He returns here again today for anemia.  Hgb in April was 9.3 grams.  December it was 8.9 grams.  Iron is 22 and ferritin is 11.1.  He was not on iron supplements but just started ferrous sulfate 325 mg daily about 3 weeks ago and is scheduled for IV iron infusion next week.  He remains on ASA 81 mg daily and plavix.  He denies any sign of GI bleeding, no black or bloody stools.  Is on pantoprazole 40 mg daily at home.  Past Medical History:  Diagnosis Date  . Adrenal insufficiency ([place])   . Allergy   . Anemia   . Arthritis    feet   . Broken foot 09/2019   had to wore a boot. Left   . CKD (chronic kidney disease), stage III ([place])   . Coronary artery disease    has stents  .  Coronary atherosclerosis of native coronary artery    Proximal LAD, posterior lateral stent widely patent-10/12/11  . Diabetes mellitus    insulin and pills  . Hearing loss    wears hearing aids  . Heart attack ([place]) 2010  . History of blood transfusion 06/30/2016   [facility] - 2 units transfused  . Hypertension   . OSA (obstructive sleep apnea)    uses VPAC sleep study 2 years done through [facility]. Dr. Marlou Porch arranged study  . Pneumonia    3-4 years ago  . Sarcoid   . Sarcoidosis   . Sleep apnea    uses CPAP nightly  . Thyroid disease   . Type 2 diabetes mellitus ([place])    Past Surgical History:  Procedure Laterality Date  . APPENDECTOMY    . CATARACT EXTRACTION  2011   bilat  . CHOLECYSTECTOMY  01/25/2011   Procedure: LAPAROSCOPIC CHOLECYSTECTOMY WITH INTRAOPERATIVE CHOLANGIOGRAM;  Surgeon: Judieth Keens, DO;  Location: [facility];  Service: General;  Laterality: N/A;  . COLONOSCOPY     several  . COLONOSCOPY WITH PROPOFOL N/A 01/03/2020   Procedure: COLONOSCOPY WITH PROPOFOL;  Surgeon: Gatha Mayer, MD;  Location: [facility];  Service: Endoscopy;  Laterality: N/A;  . CORONARY ANGIOPLASTY     most recent 11/2009  . CORONARY STENT INTERVENTION N/A 08/07/2018  Procedure: CORONARY STENT INTERVENTION;  Surgeon: Jettie Booze, MD;  Location: [facility];  Service: Cardiovascular;  Laterality: N/A;  . CORONARY STENT PLACEMENT  2009   in LAD and side branch PTCA  . ESOPHAGOGASTRODUODENOSCOPY (EGD) WITH PROPOFOL N/A 01/03/2020   Procedure: ESOPHAGOGASTRODUODENOSCOPY (EGD) WITH PROPOFOL;  Surgeon: Gatha Mayer, MD;  Location: [facility];  Service: Endoscopy;  Laterality: N/A;  . HEMOSTASIS CLIP PLACEMENT  01/03/2020   Procedure: HEMOSTASIS CLIP PLACEMENT;  Surgeon: Gatha Mayer, MD;  Location: [facility];  Service: Endoscopy;;  . INTERCOSTAL NERVE BLOCK  2011, 06/2016   x2. lumbar spine  . LEFT HEART CATHETERIZATION WITH CORONARY ANGIOGRAM Bilateral  10/12/2011   Procedure: LEFT HEART CATHETERIZATION WITH CORONARY ANGIOGRAM;  Surgeon: Candee Furbish, MD;  Location: [facility] CATH LAB;  Service: Cardiovascular;  Laterality: Bilateral;  . LEFT HEART CATHETERIZATION WITH CORONARY ANGIOGRAM N/A 04/01/2014   Procedure: LEFT HEART CATHETERIZATION WITH CORONARY ANGIOGRAM;  Surgeon: Candee Furbish, MD;  Location: [facility] CATH LAB;  Service: Cardiovascular;  Laterality: N/A;  . POLYPECTOMY  01/03/2020   Procedure: POLYPECTOMY;  Surgeon: Gatha Mayer, MD;  Location: [facility];  Service: Endoscopy;;  . RIGHT/LEFT HEART CATH AND CORONARY ANGIOGRAPHY N/A 08/07/2018   Procedure: RIGHT/LEFT HEART CATH AND CORONARY ANGIOGRAPHY;  Surgeon: Jettie Booze, MD;  Location: [facility];  Service: Cardiovascular;  Laterality: N/A;    reports that he has never smoked. He has never used smokeless tobacco. He reports that he does not drink alcohol and does not use drugs. family history includes Anesthesia problems in his sister; Asthma in his sister; Diabetes in his mother; Heart attack in his father; Kidney cancer in his mother; Kidney disease in his mother; Sarcoidosis in his niece and sister. Allergies  Allergen Reactions  . Statins Other (See Comments)    Pt says they make him "mean"  . Hydrocortisone Nausea Only  . Miralax [Polyethylene Glycol] Nausea And Vomiting    Patient refuses to take- reminded me today of this. ta  . Metolazone Other (See Comments)    Drained, no energy      Outpatient Encounter Medications as of 07/21/2020  Medication Sig  . acetaminophen (TYLENOL) 500 MG tablet Take 500 mg by mouth daily as needed for moderate pain or headache.   . albuterol (PROVENTIL) (2.5 MG/3ML) 0.083% nebulizer solution Take 3 mLs (2.5 mg total) by nebulization every 6 (six) hours as needed for wheezing or shortness of breath. Dx Code D86.0  . Albuterol Sulfate (PROAIR RESPICLICK) 621 (90 BASE) MCG/ACT AEPB Inhale 2 puffs into the lungs every 6 (six) hours as  needed. (Patient taking differently: Inhale 2 puffs into the lungs every 6 (six) hours as needed (wheezing & shortness of breath).)  . amLODipine (NORVASC) 5 MG tablet Take 1 tablet (5 mg total) by mouth daily.  Marland Kitchen aspirin EC 81 MG tablet Take 81 mg by mouth every evening.   . brimonidine (ALPHAGAN) 0.2 % ophthalmic solution Place 1 drop into the left eye daily.   . Cholecalciferol (VITAMIN D3) 3000 UNITS TABS Take 3,000 Units by mouth daily.  . clopidogrel (PLAVIX) 75 MG tablet Take 1 tablet (75 mg total) by mouth daily.  . Coenzyme Q10 (COQ-10) 400 MG CAPS Take 400 mg by mouth every evening.  . DROPLET INSULIN SYRINGE 31G X 5/16" 1 ML MISC   . fish oil-omega-3 fatty acids 1000 MG capsule Take 1 g by mouth daily.   . furosemide (LASIX) 80 MG tablet Take 1  tablet (80 mg total) by mouth daily.  Marland Kitchen glipiZIDE (GLUCOTROL) 10 MG tablet Take 10 mg by mouth every evening.  . insulin aspart protamine- aspart (NOVOLOG MIX 70/30) (70-30) 100 UNIT/ML injection Inject 0.3 mLs (30 Units total) into the skin 2 (two) times daily with a meal.  . levothyroxine (SYNTHROID, LEVOTHROID) 50 MCG tablet Take 50 mcg by mouth daily.   Marland Kitchen losartan (COZAAR) 100 MG tablet Take 100 mg by mouth daily.  . montelukast (SINGULAIR) 10 MG tablet Take 10 mg by mouth daily as needed (allergies).   . Multiple Vitamin (MULTIVITAMIN WITH MINERALS) TABS tablet Take 1 tablet by mouth daily.  . nitroGLYCERIN (NITROSTAT) 0.4 MG SL tablet Place 1 tablet (0.4 mg total) under the tongue every 5 (five) minutes as needed for chest pain.  . pantoprazole (PROTONIX) 40 MG tablet Take 40 mg by mouth daily.  . predniSONE (DELTASONE) 10 MG tablet Take 1 tablet (10 mg total) by mouth daily with breakfast. T (Patient taking differently: Take 10 mg by mouth daily with breakfast.)  . RABEprazole (ACIPHEX) 20 MG tablet Take 20 mg by mouth daily as needed (acid reflux).   . rosuvastatin (CRESTOR) 10 MG tablet TAKE 1 TABLET AT BEDTIME (Patient taking  differently: Take 10 mg by mouth See admin instructions. Takes 1 tablet 2 times a week)  . sulfamethoxazole-trimethoprim (BACTRIM DS) 800-160 MG tablet Take 1 tablet by mouth 2 (two) times daily.  . traMADol (ULTRAM) 50 MG tablet Take 50 mg by mouth every 6 (six) hours as needed for moderate pain.   . Vitamin D, Ergocalciferol, (DRISDOL) 50000 UNITS CAPS capsule Take 50,000 Units by mouth every Sunday.    No facility-administered encounter medications on file as of 07/21/2020.     REVIEW OF SYSTEMS  : All other systems reviewed and negative except where noted in the History of Present Illness.   PHYSICAL EXAM: BP 130/72   Pulse 62   Ht 5\' 10"  (1.778 m)   Wt 229 lb 2 oz (103.9 kg)   BMI 32.88 kg/m  General: Well developed white male in no acute distress Head: Normocephalic and atraumatic Eyes:  Sclerae anicteric, conjunctiva pink. Ears: Normal auditory acuity Lungs: Clear throughout to auscultation; no W/R/R. Heart: Regular rate and rhythm; no M/R/G. Abdomen: Soft, non-distended.  BS present.  Non-tender. Musculoskeletal: Symmetrical with no gross deformities  Skin: No lesions on visible extremities Extremities: No edema  Neurological: Alert oriented x 4, grossly non-focal Psychological:  Alert and cooperative. Normal mood and affect  ASSESSMENT AND PLAN: *IDA:  This has been a recurrent issue in the setting of ASA and Plavix use for history of CAD with DES.  Just had EGD and colonoscopy in 12/2019 for evaluation of this issue with only two hyperplastic gastric polyps and blood noted in the duodenum on those studies.  His Hgb is 9.3 grams from last month and iron studies are low.  He had not been on iron supplementation, but was just placed on ferrous sulfate 325 mg daily about 3 weeks ago and is getting IV iron infusion next week.  He had two surgeries on his foot in December and is still recovering from those.  He has no overt signs of GI bleeding.  We discussed observation for now  and monitoring of Hgb/response to iron supplementation.  He is agreeable with that.  PCP can certainly monitor and we can see him back if he fails to respond, continues to have down-trending Hgb, develops overt GI bleeding, etc.  CC:  Shirline Frees, MD

## 2020-07-21 NOTE — Patient Instructions (Addendum)
If you are age 75 or older, your body mass index should be between 23-30. Your Body mass index is 32.88 kg/m. If this is out of the aforementioned range listed, please consider follow up with your Primary Care Provider.  If you are age 10 or younger, your body mass index should be between 19-25. Your Body mass index is 32.88 kg/m. If this is out of the aformentioned range listed, please consider follow up with your Primary Care Provider.   Follow up if ongoing anemia continues despite iron supplementation.  Thank you for choosing me and [facility].  Alonza Bogus, PA-C

## 2020-07-27 ENCOUNTER — Ambulatory Visit: Payer: Medicare HMO | Admitting: Cardiology

## 2020-07-27 DIAGNOSIS — E11610 Type 2 diabetes mellitus with diabetic neuropathic arthropathy: Secondary | ICD-10-CM | POA: Diagnosis not present

## 2020-07-28 ENCOUNTER — Encounter (HOSPITAL_COMMUNITY): Payer: Medicare HMO

## 2020-07-28 ENCOUNTER — Other Ambulatory Visit (HOSPITAL_COMMUNITY): Payer: Self-pay

## 2020-07-28 NOTE — Discharge Instructions (Signed)
Ferumoxytol injection What is this medicine? FERUMOXYTOL is an iron complex. Iron is used to make healthy red blood cells, which carry oxygen and nutrients throughout the body. This medicine is used to treat iron deficiency anemia. This medicine may be used for other purposes; ask your health care provider or pharmacist if you have questions. COMMON BRAND NAME(S): Feraheme What should I tell my health care provider before I take this medicine? They need to know if you have any of these conditions:  anemia not caused by low iron levels  high levels of iron in the blood  magnetic resonance imaging (MRI) test scheduled  an unusual or allergic reaction to iron, other medicines, foods, dyes, or preservatives  pregnant or trying to get pregnant  breast-feeding How should I use this medicine? This medicine is for injection into a vein. It is given by a health care professional in a hospital or clinic setting. Talk to your pediatrician regarding the use of this medicine in children. Special care may be needed. Overdosage: If you think you have taken too much of this medicine contact a poison control center or emergency room at once. NOTE: This medicine is only for you. Do not share this medicine with others. What if I miss a dose? It is important not to miss your dose. Call your doctor or health care professional if you are unable to keep an appointment. What may interact with this medicine? This medicine may interact with the following medications:  other iron products This list may not describe all possible interactions. Give your health care provider a list of all the medicines, herbs, non-prescription drugs, or dietary supplements you use. Also tell them if you smoke, drink alcohol, or use illegal drugs. Some items may interact with your medicine. What should I watch for while using this medicine? Visit your doctor or healthcare professional regularly. Tell your doctor or healthcare  professional if your symptoms do not start to get better or if they get worse. You may need blood work done while you are taking this medicine. You may need to follow a special diet. Talk to your doctor. Foods that contain iron include: whole grains/cereals, dried fruits, beans, or peas, leafy green vegetables, and organ meats (liver, kidney). What side effects may I notice from receiving this medicine? Side effects that you should report to your doctor or health care professional as soon as possible:  allergic reactions like skin rash, itching or hives, swelling of the face, lips, or tongue  breathing problems  changes in blood pressure  feeling faint or lightheaded, falls  fever or chills  flushing, sweating, or hot feelings  swelling of the ankles or feet Side effects that usually do not require medical attention (report to your doctor or health care professional if they continue or are bothersome):  diarrhea  headache  nausea, vomiting  stomach pain This list may not describe all possible side effects. Call your doctor for medical advice about side effects. You may report side effects to FDA at 1-800-FDA-1088. Where should I keep my medicine? This drug is given in a hospital or clinic and will not be stored at home. NOTE: This sheet is a summary. It may not cover all possible information. If you have questions about this medicine, talk to your doctor, pharmacist, or health care provider.  2021 Elsevier/Gold Standard (2016-04-11 20:21:10)  

## 2020-07-29 ENCOUNTER — Other Ambulatory Visit: Payer: Self-pay

## 2020-07-29 ENCOUNTER — Ambulatory Visit (HOSPITAL_COMMUNITY)
Admission: RE | Admit: 2020-07-29 | Discharge: 2020-07-29 | Disposition: A | Discharge status: Home / Self Care | Payer: Medicare HMO | Source: Ambulatory Visit | Attending: Family Medicine | Admitting: Family Medicine

## 2020-07-29 DIAGNOSIS — D509 Iron deficiency anemia, unspecified: Secondary | ICD-10-CM | POA: Diagnosis not present

## 2020-07-29 MED ORDER — SODIUM CHLORIDE 0.9 % IV SOLN
510.0000 mg | INTRAVENOUS | Status: DC
  Administered 2020-07-29: 510 mg via INTRAVENOUS
  Filled 2020-07-29: qty 510

## 2020-08-05 ENCOUNTER — Encounter (HOSPITAL_COMMUNITY)
Admission: RE | Admit: 2020-08-05 | Discharge: 2020-08-05 | Disposition: A | Discharge status: Home / Self Care | Payer: Medicare HMO | Source: Ambulatory Visit | Attending: Internal Medicine | Admitting: Internal Medicine

## 2020-08-05 ENCOUNTER — Other Ambulatory Visit: Payer: Self-pay

## 2020-08-05 DIAGNOSIS — D508 Other iron deficiency anemias: Secondary | ICD-10-CM | POA: Diagnosis not present

## 2020-08-05 MED ORDER — SODIUM CHLORIDE 0.9 % IV SOLN
510.0000 mg | INTRAVENOUS | Status: DC
  Administered 2020-08-05: 510 mg via INTRAVENOUS
  Filled 2020-08-05: qty 510

## 2020-08-06 ENCOUNTER — Encounter: Payer: Self-pay | Admitting: Gastroenterology

## 2020-08-11 DIAGNOSIS — Z8631 Personal history of diabetic foot ulcer: Secondary | ICD-10-CM | POA: Diagnosis not present

## 2020-08-11 DIAGNOSIS — E11610 Type 2 diabetes mellitus with diabetic neuropathic arthropathy: Secondary | ICD-10-CM | POA: Diagnosis not present

## 2020-08-14 DIAGNOSIS — M1990 Unspecified osteoarthritis, unspecified site: Secondary | ICD-10-CM | POA: Diagnosis not present

## 2020-08-27 ENCOUNTER — Ambulatory Visit: Payer: Medicare HMO | Admitting: Endocrinology

## 2020-09-01 DIAGNOSIS — M791 Myalgia, unspecified site: Secondary | ICD-10-CM | POA: Diagnosis not present

## 2020-09-01 DIAGNOSIS — M5416 Radiculopathy, lumbar region: Secondary | ICD-10-CM | POA: Diagnosis not present

## 2020-09-13 DIAGNOSIS — M1990 Unspecified osteoarthritis, unspecified site: Secondary | ICD-10-CM | POA: Diagnosis not present

## 2020-09-14 DIAGNOSIS — E11610 Type 2 diabetes mellitus with diabetic neuropathic arthropathy: Secondary | ICD-10-CM | POA: Diagnosis not present

## 2020-09-14 DIAGNOSIS — E11621 Type 2 diabetes mellitus with foot ulcer: Secondary | ICD-10-CM | POA: Diagnosis not present

## 2020-09-14 DIAGNOSIS — L97402 Non-pressure chronic ulcer of unspecified heel and midfoot with fat layer exposed: Secondary | ICD-10-CM | POA: Diagnosis not present

## 2020-09-21 DIAGNOSIS — E11610 Type 2 diabetes mellitus with diabetic neuropathic arthropathy: Secondary | ICD-10-CM | POA: Diagnosis not present

## 2020-09-22 DIAGNOSIS — D500 Iron deficiency anemia secondary to blood loss (chronic): Secondary | ICD-10-CM | POA: Diagnosis not present

## 2020-09-22 DIAGNOSIS — E1142 Type 2 diabetes mellitus with diabetic polyneuropathy: Secondary | ICD-10-CM | POA: Diagnosis not present

## 2020-09-22 DIAGNOSIS — E1122 Type 2 diabetes mellitus with diabetic chronic kidney disease: Secondary | ICD-10-CM | POA: Diagnosis not present

## 2020-09-30 DIAGNOSIS — L97409 Non-pressure chronic ulcer of unspecified heel and midfoot with unspecified severity: Secondary | ICD-10-CM | POA: Diagnosis not present

## 2020-09-30 DIAGNOSIS — E11621 Type 2 diabetes mellitus with foot ulcer: Secondary | ICD-10-CM | POA: Diagnosis not present

## 2020-10-14 DIAGNOSIS — E11621 Type 2 diabetes mellitus with foot ulcer: Secondary | ICD-10-CM | POA: Diagnosis not present

## 2020-10-14 DIAGNOSIS — E11610 Type 2 diabetes mellitus with diabetic neuropathic arthropathy: Secondary | ICD-10-CM | POA: Diagnosis not present

## 2020-10-14 DIAGNOSIS — M1990 Unspecified osteoarthritis, unspecified site: Secondary | ICD-10-CM | POA: Diagnosis not present

## 2020-10-14 DIAGNOSIS — L97409 Non-pressure chronic ulcer of unspecified heel and midfoot with unspecified severity: Secondary | ICD-10-CM | POA: Diagnosis not present

## 2020-10-15 DIAGNOSIS — R0981 Nasal congestion: Secondary | ICD-10-CM | POA: Diagnosis not present

## 2020-10-15 DIAGNOSIS — U071 COVID-19: Secondary | ICD-10-CM | POA: Diagnosis not present

## 2020-10-15 DIAGNOSIS — R5383 Other fatigue: Secondary | ICD-10-CM | POA: Diagnosis not present

## 2020-10-15 DIAGNOSIS — R051 Acute cough: Secondary | ICD-10-CM | POA: Diagnosis not present

## 2020-10-21 DIAGNOSIS — L97429 Non-pressure chronic ulcer of left heel and midfoot with unspecified severity: Secondary | ICD-10-CM | POA: Diagnosis not present

## 2020-10-21 DIAGNOSIS — E11610 Type 2 diabetes mellitus with diabetic neuropathic arthropathy: Secondary | ICD-10-CM | POA: Diagnosis not present

## 2020-10-21 DIAGNOSIS — E11621 Type 2 diabetes mellitus with foot ulcer: Secondary | ICD-10-CM | POA: Diagnosis not present

## 2020-10-21 DIAGNOSIS — L97409 Non-pressure chronic ulcer of unspecified heel and midfoot with unspecified severity: Secondary | ICD-10-CM | POA: Diagnosis not present

## 2020-10-26 DIAGNOSIS — M5416 Radiculopathy, lumbar region: Secondary | ICD-10-CM | POA: Diagnosis not present

## 2020-11-05 DIAGNOSIS — L97429 Non-pressure chronic ulcer of left heel and midfoot with unspecified severity: Secondary | ICD-10-CM | POA: Diagnosis not present

## 2020-11-05 DIAGNOSIS — L97409 Non-pressure chronic ulcer of unspecified heel and midfoot with unspecified severity: Secondary | ICD-10-CM | POA: Diagnosis not present

## 2020-11-05 DIAGNOSIS — E11610 Type 2 diabetes mellitus with diabetic neuropathic arthropathy: Secondary | ICD-10-CM | POA: Diagnosis not present

## 2020-11-05 DIAGNOSIS — E11621 Type 2 diabetes mellitus with foot ulcer: Secondary | ICD-10-CM | POA: Diagnosis not present

## 2020-11-11 DIAGNOSIS — L97409 Non-pressure chronic ulcer of unspecified heel and midfoot with unspecified severity: Secondary | ICD-10-CM | POA: Diagnosis not present

## 2020-11-11 DIAGNOSIS — E11621 Type 2 diabetes mellitus with foot ulcer: Secondary | ICD-10-CM | POA: Diagnosis not present

## 2020-11-14 DIAGNOSIS — M1990 Unspecified osteoarthritis, unspecified site: Secondary | ICD-10-CM | POA: Diagnosis not present

## 2020-11-18 DIAGNOSIS — E11621 Type 2 diabetes mellitus with foot ulcer: Secondary | ICD-10-CM | POA: Diagnosis not present

## 2020-11-18 DIAGNOSIS — L97409 Non-pressure chronic ulcer of unspecified heel and midfoot with unspecified severity: Secondary | ICD-10-CM | POA: Diagnosis not present

## 2020-11-18 DIAGNOSIS — E11610 Type 2 diabetes mellitus with diabetic neuropathic arthropathy: Secondary | ICD-10-CM | POA: Diagnosis not present

## 2020-11-19 DIAGNOSIS — Z23 Encounter for immunization: Secondary | ICD-10-CM | POA: Diagnosis not present

## 2020-11-19 DIAGNOSIS — D869 Sarcoidosis, unspecified: Secondary | ICD-10-CM | POA: Diagnosis not present

## 2020-11-19 DIAGNOSIS — D508 Other iron deficiency anemias: Secondary | ICD-10-CM | POA: Diagnosis not present

## 2020-11-19 DIAGNOSIS — R0602 Shortness of breath: Secondary | ICD-10-CM | POA: Diagnosis not present

## 2020-11-19 DIAGNOSIS — E1142 Type 2 diabetes mellitus with diabetic polyneuropathy: Secondary | ICD-10-CM | POA: Diagnosis not present

## 2020-11-19 DIAGNOSIS — Z0000 Encounter for general adult medical examination without abnormal findings: Secondary | ICD-10-CM | POA: Diagnosis not present

## 2020-11-19 DIAGNOSIS — E7800 Pure hypercholesterolemia, unspecified: Secondary | ICD-10-CM | POA: Diagnosis not present

## 2020-11-19 DIAGNOSIS — G4733 Obstructive sleep apnea (adult) (pediatric): Secondary | ICD-10-CM | POA: Diagnosis not present

## 2020-11-19 DIAGNOSIS — I10 Essential (primary) hypertension: Secondary | ICD-10-CM | POA: Diagnosis not present

## 2020-11-19 DIAGNOSIS — E039 Hypothyroidism, unspecified: Secondary | ICD-10-CM | POA: Diagnosis not present

## 2020-11-23 DIAGNOSIS — L97409 Non-pressure chronic ulcer of unspecified heel and midfoot with unspecified severity: Secondary | ICD-10-CM | POA: Diagnosis not present

## 2020-11-23 DIAGNOSIS — E11621 Type 2 diabetes mellitus with foot ulcer: Secondary | ICD-10-CM | POA: Diagnosis not present

## 2020-12-02 DIAGNOSIS — E11610 Type 2 diabetes mellitus with diabetic neuropathic arthropathy: Secondary | ICD-10-CM | POA: Diagnosis not present

## 2020-12-07 DIAGNOSIS — M7989 Other specified soft tissue disorders: Secondary | ICD-10-CM | POA: Diagnosis not present

## 2020-12-07 DIAGNOSIS — E11610 Type 2 diabetes mellitus with diabetic neuropathic arthropathy: Secondary | ICD-10-CM | POA: Diagnosis not present

## 2020-12-14 DIAGNOSIS — M1990 Unspecified osteoarthritis, unspecified site: Secondary | ICD-10-CM | POA: Diagnosis not present

## 2020-12-17 DIAGNOSIS — L97409 Non-pressure chronic ulcer of unspecified heel and midfoot with unspecified severity: Secondary | ICD-10-CM | POA: Diagnosis not present

## 2020-12-17 DIAGNOSIS — E11610 Type 2 diabetes mellitus with diabetic neuropathic arthropathy: Secondary | ICD-10-CM | POA: Diagnosis not present

## 2020-12-17 DIAGNOSIS — E11621 Type 2 diabetes mellitus with foot ulcer: Secondary | ICD-10-CM | POA: Diagnosis not present

## 2020-12-21 ENCOUNTER — Telehealth: Payer: Self-pay | Admitting: Cardiology

## 2020-12-21 NOTE — Telephone Encounter (Signed)
Pt c/o Shortness Of Breath: STAT if SOB developed within the last 24 hours or pt is noticeably SOB on the phone  1. Are you currently SOB (can you hear that pt is SOB on the phone)? No   2. How long have you been experiencing SOB? Started about 2 weeks ago and progressively got worse  3. Are you SOB when sitting or when up moving around? Moving around   4. Are you currently experiencing any other symptoms? Fatigue and occasional CP upon exertion that stops after resting a few moments   Pt c/o of Chest Pain: STAT if CP now or developed within 24 hours  1. Are you having CP right now? No   2. Are you experiencing any other symptoms (ex. SOB, nausea, vomiting, sweating)? See above dot phrase   3. How long have you been experiencing CP? For about two weeks progressively getting worse  4. Is your CP continuous or coming and going? Coming and going   5. Have you taken Nitroglycerin? No    Requesting to be worked in for an appt in regards to this.  ?

## 2020-12-21 NOTE — Telephone Encounter (Signed)
Spoke with pt who reports he has been SOB and fatigue since August when he had Covid.  His s/s have gotten worse since then.  He states the last time he felt like this he had to have a stent. Pt has been treated for a GI bleed and has received Fe infusions.  Feels this has been corrected but SOB and fatigue has only worsened in the past 2 months since having Covid.  Appt scheduled for pt for further evaluation with Dr Marlou Porch.  Pt is aware if s/s get worse before then he should contact the office or call 911 and report to closest ED.

## 2020-12-24 ENCOUNTER — Ambulatory Visit: Payer: Medicare HMO | Admitting: Cardiology

## 2020-12-24 ENCOUNTER — Other Ambulatory Visit: Payer: Self-pay

## 2020-12-24 ENCOUNTER — Encounter: Payer: Self-pay | Admitting: Cardiology

## 2020-12-24 VITALS — BP 140/60 | HR 74 | Ht 70.0 in | Wt 225.0 lb

## 2020-12-24 DIAGNOSIS — R0602 Shortness of breath: Secondary | ICD-10-CM

## 2020-12-24 DIAGNOSIS — I2510 Atherosclerotic heart disease of native coronary artery without angina pectoris: Secondary | ICD-10-CM

## 2020-12-24 DIAGNOSIS — Z01818 Encounter for other preprocedural examination: Secondary | ICD-10-CM

## 2020-12-24 DIAGNOSIS — Z01812 Encounter for preprocedural laboratory examination: Secondary | ICD-10-CM | POA: Diagnosis not present

## 2020-12-24 DIAGNOSIS — I25119 Atherosclerotic heart disease of native coronary artery with unspecified angina pectoris: Secondary | ICD-10-CM

## 2020-12-24 LAB — CBC
Hematocrit: 31.6 % — ABNORMAL LOW (ref 37.5–51.0)
Hemoglobin: 10.2 g/dL — ABNORMAL LOW (ref 13.0–17.7)
MCH: 30.4 pg (ref 26.6–33.0)
MCHC: 32.3 g/dL (ref 31.5–35.7)
MCV: 94 fL (ref 79–97)
Platelets: 343 10*3/uL (ref 150–450)
RBC: 3.35 x10E6/uL — ABNORMAL LOW (ref 4.14–5.80)
RDW: 15.3 % (ref 11.6–15.4)
WBC: 11.4 10*3/uL — ABNORMAL HIGH (ref 3.4–10.8)

## 2020-12-24 LAB — BASIC METABOLIC PANEL
BUN/Creatinine Ratio: 19 (ref 10–24)
BUN: 28 mg/dL — ABNORMAL HIGH (ref 8–27)
CO2: 27 mmol/L (ref 20–29)
Calcium: 9.1 mg/dL (ref 8.6–10.2)
Chloride: 103 mmol/L (ref 96–106)
Creatinine, Ser: 1.49 mg/dL — ABNORMAL HIGH (ref 0.76–1.27)
Glucose: 271 mg/dL — ABNORMAL HIGH (ref 70–99)
Potassium: 4.5 mmol/L (ref 3.5–5.2)
Sodium: 140 mmol/L (ref 134–144)
eGFR: 49 mL/min/{1.73_m2} — ABNORMAL LOW (ref >59)

## 2020-12-24 NOTE — H&P (Signed)
CP, SOB, CKD, poor risk factor control, bleeding continues, recent iron infusion, on Plavix and aspirin.

## 2020-12-24 NOTE — H&P (View-Only) (Signed)
Cardiology Office Note:    Date:  12/24/2020   ID:  [name], [name] 1945/10/27, MRN [id number]  PCP:  Shirline Frees, MD   [facility] Providers Cardiologist:  Candee Furbish, MD     Referring MD: Shirline Frees, MD    History of Present Illness:    [name] is a 75 y.o. male here for evaluation of more shortness of breath and fatigue since August when he had [place].  His symptoms have gotten worse.  He states that the last time he felt like this was when he had to have percutaneous intervention/stent.  He has been treated for GI bleed and has received iron infusion.  Since his hemoglobin has been corrected his shortness of breath and fatigue is only worsened.  He had an upper GI bleed, clipping.  Dr. Carlean Purl.  01/03/2020.  From last visit: CAD status post DES to the LAD and PLA 10/2009, DES x2 to the Elsa 08/2018, echo 07/2018 LVEF 60 to 65% with grade 1 DD, diastolic CHF, pulmonary sarcoidosis, history of GI bleed, adrenal insufficiency on chronic steroids, hypertension, HLD, PAD with left tibial disease treated medically, OSA, CKD.   Patient was last seen by [name], PA-C 10/11/2019 having symptoms of dyspnea on exertion and chest pain but was also Hemoccult positive and having a GI work-up.  He decrease losartan to 50 mg daily and started isosorbide 30 mg daily.  Patient was admitted with a GI bleed 12/2019 requiring transfusion.  Endoscopy showed small amount of fresh blood in the duodenal bulb but no bleeding site and 2 small polyps were removed.  Colonoscopy no active bleeding.  They resumed his Plavix and aspirin.    Has since had 2 surgeries on his left foot at [facility] in December for charcot's and osteomyelitis. HR was up and down during the second foot surgery and was in ICU for a couple days. Didn't change his meds. Now with right knee problems.   Complains of chest pain and DOE with little activity for about 2 months. Hgb dropped from 12.9 to 9.3 on report he got yesterday.  Iron is low and may need Iron infusion. He didn't tolerate Imdur-says it made his chest hurt worse. Was at beach last week and ate out. Swollen ankles/legs since. A1C over 10. On chronic steroids for his lungs. Using walker and knee brace.   He has had his left foot operated on at [facility], limb preservation.  He was told previously here that he would need to lose his left leg below the knee.  Overall after his COVID infection in August of this year, he continues to have significant shortness of breath.  This is worrying him.  Excessive fatigue.  Hemoglobin has been stable 12.3 he states.  No active GI bleeding.  Past Medical History:  Diagnosis Date   Adrenal insufficiency ([place])    Allergy    Anemia    Arthritis    feet    Broken foot 09/2019   had to wore a boot. Left    CKD (chronic kidney disease), stage III (HCC)    Coronary artery disease    has stents   Coronary atherosclerosis of native coronary artery    Proximal LAD, posterior lateral stent widely patent-10/12/11   Diabetes mellitus    insulin and pills   Hearing loss    wears hearing aids   Heart attack [facility]) 2010   History of blood transfusion 06/30/2016   [facility] - 2 units  transfused   Hypertension    OSA (obstructive sleep apnea)    uses VPAC sleep study 2 years done through [facility]. Dr. Marlou Porch arranged study   Pneumonia    3-4 years ago   Sarcoid    Sarcoidosis    Sleep apnea    uses CPAP nightly   Thyroid disease    Type 2 diabetes mellitus ([place])     Past Surgical History:  Procedure Laterality Date   APPENDECTOMY     CATARACT EXTRACTION  2011   bilat   CHOLECYSTECTOMY  01/25/2011   Procedure: LAPAROSCOPIC CHOLECYSTECTOMY WITH INTRAOPERATIVE CHOLANGIOGRAM;  Surgeon: Judieth Keens, DO;  Location: [place];  Service: General;  Laterality: N/A;   COLONOSCOPY     several   COLONOSCOPY WITH PROPOFOL N/A 01/03/2020   Procedure: COLONOSCOPY WITH PROPOFOL;  Surgeon: Gatha Mayer, MD;  Location: [facility]  ENDOSCOPY;  Service: Endoscopy;  Laterality: N/A;   CORONARY ANGIOPLASTY     most recent 11/2009   CORONARY STENT INTERVENTION N/A 08/07/2018   Procedure: CORONARY STENT INTERVENTION;  Surgeon: Jettie Booze, MD;  Location: [place] CV LAB;  Service: Cardiovascular;  Laterality: N/A;   CORONARY STENT PLACEMENT  2009   in LAD and side branch PTCA   ESOPHAGOGASTRODUODENOSCOPY (EGD) WITH PROPOFOL N/A 01/03/2020   Procedure: ESOPHAGOGASTRODUODENOSCOPY (EGD) WITH PROPOFOL;  Surgeon: Gatha Mayer, MD;  Location: [facility] ENDOSCOPY;  Service: Endoscopy;  Laterality: N/A;   HEMOSTASIS CLIP PLACEMENT  01/03/2020   Procedure: HEMOSTASIS CLIP PLACEMENT;  Surgeon: Gatha Mayer, MD;  Location: [facility] ENDOSCOPY;  Service: Endoscopy;;   INTERCOSTAL NERVE BLOCK  2011, 06/2016   x2. lumbar spine   LEFT HEART CATHETERIZATION WITH CORONARY ANGIOGRAM Bilateral 10/12/2011   Procedure: LEFT HEART CATHETERIZATION WITH CORONARY ANGIOGRAM;  Surgeon: Candee Furbish, MD;  Location: [facility] CATH LAB;  Service: Cardiovascular;  Laterality: Bilateral;   LEFT HEART CATHETERIZATION WITH CORONARY ANGIOGRAM N/A 04/01/2014   Procedure: LEFT HEART CATHETERIZATION WITH CORONARY ANGIOGRAM;  Surgeon: Candee Furbish, MD;  Location: [facility] CATH LAB;  Service: Cardiovascular;  Laterality: N/A;   POLYPECTOMY  01/03/2020   Procedure: POLYPECTOMY;  Surgeon: Gatha Mayer, MD;  Location: [facility] ENDOSCOPY;  Service: Endoscopy;;   RIGHT/LEFT HEART CATH AND CORONARY ANGIOGRAPHY N/A 08/07/2018   Procedure: RIGHT/LEFT HEART CATH AND CORONARY ANGIOGRAPHY;  Surgeon: Jettie Booze, MD;  Location: [place] CV [facility];  Service: Cardiovascular;  Laterality: N/A;    Current Medications: Current Meds  Medication Sig   acetaminophen (TYLENOL) 500 MG tablet Take 500 mg by mouth daily as needed for moderate pain or headache.    albuterol (PROVENTIL) (2.5 MG/3ML) 0.083% nebulizer solution Take 3 mLs (2.5 mg total) by nebulization every 6 (six) hours as needed for  wheezing or shortness of breath. Dx Code D86.0   Albuterol Sulfate (PROAIR RESPICLICK) 343 (90 BASE) MCG/ACT AEPB Inhale 2 puffs into the lungs every 6 (six) hours as needed. (Patient taking differently: Inhale 2 puffs into the lungs every 6 (six) hours as needed (sinus).)   amLODipine (NORVASC) 5 MG tablet Take 1 tablet (5 mg total) by mouth daily. (Patient not taking: No sig reported)   aspirin EC 81 MG tablet Take 81 mg by mouth every evening.    Cholecalciferol (VITAMIN D3) 3000 UNITS TABS Take 3,000 Units by mouth once a week.   clopidogrel (PLAVIX) 75 MG tablet Take 1 tablet (75 mg total) by mouth daily.   Coenzyme Q10 (COQ-10) 400 MG CAPS Take 400 mg by mouth 3 (  three) times a week.   DROPLET INSULIN SYRINGE 31G X 5/16" 1 ML MISC    furosemide (LASIX) 80 MG tablet Take 1 tablet (80 mg total) by mouth daily.   glipiZIDE (GLUCOTROL) 10 MG tablet Take 10 mg by mouth every evening.   insulin aspart protamine- aspart (NOVOLOG MIX 70/30) (70-30) 100 UNIT/ML injection Inject 0.3 mLs (30 Units total) into the skin 2 (two) times daily with a meal. (Patient taking differently: Inject 35 Units into the skin 2 (two) times daily with a meal.)   levothyroxine (SYNTHROID, LEVOTHROID) 50 MCG tablet Take 50 mcg by mouth daily.    losartan (COZAAR) 100 MG tablet Take 100 mg by mouth daily.   montelukast (SINGULAIR) 10 MG tablet Take 10 mg by mouth daily as needed (allergies).    Multiple Vitamin (MULTIVITAMIN WITH MINERALS) TABS tablet Take 1 tablet by mouth daily.   nitroGLYCERIN (NITROSTAT) 0.4 MG SL tablet Place 1 tablet (0.4 mg total) under the tongue every 5 (five) minutes as needed for chest pain.   pantoprazole (PROTONIX) 40 MG tablet Take 40 mg by mouth daily.   predniSONE (DELTASONE) 10 MG tablet Take 1 tablet (10 mg total) by mouth daily with breakfast. T   RABEprazole (ACIPHEX) 20 MG tablet Take 20 mg by mouth daily.   rosuvastatin (CRESTOR) 10 MG tablet TAKE 1 TABLET AT BEDTIME (Patient taking  differently: Take 10 mg by mouth once a week.)   [DISCONTINUED] brimonidine (ALPHAGAN) 0.2 % ophthalmic solution Place 1 drop into the left eye daily.    [DISCONTINUED] fish oil-omega-3 fatty acids 1000 MG capsule Take 1 g by mouth daily.    [DISCONTINUED] Vitamin D, Ergocalciferol, (DRISDOL) 50000 UNITS CAPS capsule Take 50,000 Units by mouth every Sunday.      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   Occupation: MANAGER    Employer: [employer]  Tobacco Use   Smoking status: Never   Smokeless tobacco: Never  Vaping Use   Vaping Use: Never used  Substance and Sexual Activity   Alcohol use: No   Drug use: No   Sexual activity: Not on file  Other Topics Concern   Not on file  Social History Narrative   Not on file   Social Determinants of Health   Financial Resource Strain: Not on file  Food Insecurity: Not on file  Transportation Needs: Not on file  Physical Activity: Not on file  Stress: Not on file  Social Connections: Not on file     Family History: The patient's family history includes Anesthesia problems in his sister; Asthma in his sister; Diabetes in his mother; Heart attack in his father; Kidney cancer in his mother; Kidney disease in his mother; Sarcoidosis in his niece and sister. There is no history of Colon cancer, Colon polyps, Rectal cancer, or Stomach cancer.  ROS:   Please see the history of present illness.     All other systems reviewed and are negative.  EKGs/Labs/Other Studies Reviewed:    The following studies were reviewed today:  R/L cardiac catheterization 08/07/2018 LAD proximal stent patent with 40 ISR LCx mid 50 RCA  proximal 25; RPDA ostial  80, 70; RP AV stent patent Mean PA 18, mean PCWP 12 PCI: 2.25 x 8 mm Resolute Onyx DES and 2 x 3 mm Resolute Onyx DES to the RPDA   Echocardiogram 07/27/2018 EF 60-65, mild LVH, GR 1 DD, normal RVSF  ECHO 07/2018:    1. The left ventricle has normal systolic function with an ejection  fraction of 60-65%. The cavity size was normal. There is mildly increased  left ventricular wall thickness. Left ventricular diastolic Doppler  parameters are consistent with impaired  relaxation.   2. The right ventricle has normal systolic function. The cavity was  normal. There is no increase in right ventricular wall thickness.   3. The aortic valve is tricuspid. Mild thickening of the aortic valve.  Mild calcification of the aortic valve.   Cath 08/07/2018: Diagnostic Dominance: Right Intervention    Chest CT 2021: Chronic changes consistent with the given clinical history of sarcoidosis  EKG:  EKG is  ordered today.  The ekg ordered today demonstrates sinus rhythm 74 left axis deviation.  No significant change from prior EKG personally reviewed.  Recent Labs: 01/01/2020: ALT 36 12/24/2020: BUN 28; Creatinine, Ser 1.49; Hemoglobin 10.2; Platelets 343; Potassium 4.5; Sodium 140  Recent Lipid Panel    Component Value Date/Time   CHOL 219 (H) 07/27/2018 1239   TRIG 269 (H) 07/27/2018 1239   HDL 32 (L) 07/27/2018 1239   CHOLHDL 6.8 (H) 07/27/2018 1239   CHOLHDL 11.4 08/15/2008 0408   VLDL 41 (H) 08/15/2008 0408   LDLCALC 133 (H) 07/27/2018 1239     Risk Assessment/Calculations:          Physical Exam:    VS:  BP 140/60 (BP Location: Left Arm, Patient Position: Sitting, Cuff Size: Normal)   Pulse 74   Ht 5\' 10"  (1.778 m)   Wt 225 lb (102.1 kg)   SpO2 97%   BMI 32.28 kg/m     Wt Readings from Last 3 Encounters:  12/24/20 225 lb (102.1 kg)  07/21/20 229 lb 2 oz (103.9 kg)  07/01/20 234 lb (106.1 kg)     GEN:  Well nourished, well developed in no  acute distress HEENT: Normal NECK: No JVD; No carotid bruits LYMPHATICS: No lymphadenopathy CARDIAC: RRR, no murmurs, rubs, gallops RESPIRATORY:  Clear to auscultation without rales, wheezing or rhonchi  ABDOMEN: Soft, non-tender, non-distended MUSCULOSKELETAL:  No edema; No deformity  SKIN: Warm and dry NEUROLOGIC:  Alert and oriented x 3 PSYCHIATRIC:  Normal affect   ASSESSMENT:    1. Coronary artery disease involving native coronary artery of native heart without angina pectoris   2. Atherosclerosis of native coronary artery of native heart, unspecified whether angina present   3. Coronary artery disease involving native coronary artery of native heart with angina pectoris ([place])   4. Pre-procedure lab exam   5. Shortness of breath    PLAN:    In order of problems listed above:  Coronary atherosclerosis of native coronary artery Feeling worse, more anginal type symptoms.  He feels as though he may have a possible stenosis.  We will go ahead and check a right and left heart catheterization given his excessive shortness of breath.   We had lengthy discussion about potential risks and benefits of this procedure especially given his ongoing comorbidities.  Last creatinine in the 1.3 range.  He is willing to proceed.  We will check an echocardiogram as well.  Shared Decision Making/Informed Consent The risks [stroke (1 in 1000), death (1  in 1000), kidney failure [usually temporary] (1 in 500), bleeding (1 in 200), allergic reaction [possibly serious] (1 in 200)], benefits (diagnostic support and management of coronary artery disease) and alternatives of a cardiac catheterization were discussed in detail with Mr. Boylen and he is willing to proceed.    Medication Adjustments/Labs and Tests Ordered: Current medicines are reviewed at length with the patient today.  Concerns regarding medicines are outlined above.  Orders Placed This Encounter  Procedures   Basic metabolic panel   CBC    EKG 12-Lead   ECHOCARDIOGRAM COMPLETE    No orders of the defined types were placed in this encounter.   Patient Instructions  Medication Instructions:  The current medical regimen is effective;  continue present plan and medications.  *If you need a refill on your cardiac medications before your next appointment, please call your pharmacy*   Lab Work: Please have blood work today (BMP, CBC)  If you have labs (blood work) drawn today and your tests are completely normal, you will receive your results only by: MyChart Message (if you have MyChart) OR A paper copy in the mail If you have any lab test that is abnormal or we need to change your treatment, we will call you to review the results.   Testing/Procedures: Your physician has requested that you have an echocardiogram. Echocardiography is a painless test that uses sound waves to create images of your heart. It provides your doctor with information about the size and shape of your heart and how well your heart's chambers and valves are working. This procedure takes approximately one hour. There are no restrictions for this procedure.    [place] OFFICE [place], [place] [address] Dept: [phone] Loc: [place]  12/24/2020  You are scheduled for a Cardiac Catheterization on Friday, October 21 with Dr. Daneen Schick.  1. Please arrive at the [facility] (Main Entrance A) at [facility]: [address] [place], [place] [address] at 5:30 AM (two hours before your procedure to ensure your preparation). Free valet parking service is available.   Special note: Every effort is made to have your procedure done on time. Please understand that emergencies sometimes delay scheduled procedures.  2. Diet: Do not eat or drink anything after midnight prior to your procedure except sips of water to take  medications.  3. Labs:  today (BMP,CBC)  4. Medication instructions in preparation for your procedure:  Please hold Insulin and Glipizide the morning of. Hold Furosemide the morning of your cath.  On the morning of your procedure, take your  Plavix and any morning medicines NOT listed above.  You may use sips of water.  5. Plan for one night stay--bring personal belongings. 6. Bring a current list of your medications and current insurance cards. 7. You MUST have a responsible person to drive you home. 8. Someone MUST be with you the first 24 hours after you arrive home or your discharge will be delayed. 9. Please wear clothes that are easy to get on and off and wear slip-on shoes.  Thank you for allowing [place] to care for you!   -- [place] [facility]  Follow-Up: At [facility], you and your health needs are our priority.  As part of our continuing mission to provide you with exceptional heart care, we have created designated Provider Care Teams.  These Care Teams include  your primary Cardiologist (physician) and Advanced Practice Providers (APPs -  Physician Assistants and Nurse Practitioners) who all work together to provide you with the care you need, when you need it.  We recommend signing up for the patient portal called "MyChart".  Sign up information is provided on this After Visit Summary.  MyChart is used to connect with patients for Virtual Visits (Telemedicine).  Patients are able to view lab/test results, encounter notes, upcoming appointments, etc.  Non-urgent messages can be sent to your provider as well.   To learn more about what you can do with MyChart, go to [email or web address].    Your next appointment:   1 month(s)  The format for your next appointment:   In Person  Provider:   You will see one of the following Advanced Practice Providers on your designated Care Team:    Any PA or NP  Thank you for choosing [facility]!!     Signed, Candee Furbish, MD  12/24/2020 5:11 PM    [place]

## 2020-12-24 NOTE — Assessment & Plan Note (Signed)
Feeling worse, more anginal type symptoms.  He feels as though he may have a possible stenosis.  We will go ahead and check a right and left heart catheterization given his excessive shortness of breath.

## 2020-12-24 NOTE — Patient Instructions (Signed)
Medication Instructions:  The current medical regimen is effective;  continue present plan and medications.  *If you need a refill on your cardiac medications before your next appointment, please call your pharmacy*   Lab Work: Please have blood work today (BMP, CBC)  If you have labs (blood work) drawn today and your tests are completely normal, you will receive your results only by: MyChart Message (if you have MyChart) OR A paper copy in the mail If you have any lab test that is abnormal or we need to change your treatment, we will call you to review the results.   Testing/Procedures: Your physician has requested that you have an echocardiogram. Echocardiography is a painless test that uses sound waves to create images of your heart. It provides your doctor with information about the size and shape of your heart and how well your heart's chambers and valves are working. This procedure takes approximately one hour. There are no restrictions for this procedure.    [facility] OFFICE [place], [place]  [place] [address] Dept: [phone] Loc: [place]  12/24/2020  You are scheduled for a Cardiac Catheterization on Friday, October 21 with Dr. Daneen Schick.  1. Please arrive at the [facility] [address] (Main Entrance A) at [facility]: [address] [place], [place] [address] at 5:30 AM (two hours before your procedure to ensure your preparation). Free valet parking service is available.   Special note: Every effort is made to have your procedure done on time. Please understand that emergencies sometimes delay scheduled procedures.  2. Diet: Do not eat or drink anything after midnight prior to your procedure except sips of water to take medications.  3. Labs:  today (BMP,CBC)  4. Medication instructions in preparation for your procedure:  Please hold Insulin and Glipizide the  morning of. Hold Furosemide the morning of your cath.  On the morning of your procedure, take your  Plavix and any morning medicines NOT listed above.  You may use sips of water.  5. Plan for one night stay--bring personal belongings. 6. Bring a current list of your medications and current insurance cards. 7. You MUST have a responsible person to drive you home. 8. Someone MUST be with you the first 24 hours after you arrive home or your discharge will be delayed. 9. Please wear clothes that are easy to get on and off and wear slip-on shoes.  Thank you for allowing [place] to care for you!   -- [place] [facility]  Follow-Up: At [facility], you and your health needs are our priority.  As part of our continuing mission to provide you with exceptional heart care, we have created designated Provider Care Teams.  These Care Teams include your primary Cardiologist (physician) and Advanced Practice Providers (APPs -  Physician Assistants and Nurse Practitioners) who all work together to provide you with the care you need, when you need it.  We recommend signing up for the patient portal called "MyChart".  Sign up information is provided on this After Visit Summary.  MyChart is used to connect with patients for Virtual Visits (Telemedicine).  Patients are able to view lab/test results, encounter notes, upcoming appointments, etc.  Non-urgent messages can be sent to your provider as well.   To learn more about what you can do with MyChart, go to [email or web address].    Your next appointment:   1 month(s)  The format for your next appointment:   In Person  Provider:   You will see one of the following Advanced Practice Providers on your designated Care Team:    Any PA or NP  Thank you for choosing [facility]!!

## 2020-12-24 NOTE — Progress Notes (Signed)
Cardiology Office Note:    Date:  12/24/2020   ID:  Craig, [name] 15-Jul-1945, MRN [id number]  PCP:  Shirline Frees, MD   [facility] Providers Cardiologist:  Candee Furbish, MD     Referring MD: Shirline Frees, MD    History of Present Illness:    [name] is a 75 y.o. male here for evaluation of more shortness of breath and fatigue since August when he had [place].  His symptoms have gotten worse.  He states that the last time he felt like this was when he had to have percutaneous intervention/stent.  He has been treated for GI bleed and has received iron infusion.  Since his hemoglobin has been corrected his shortness of breath and fatigue is only worsened.  He had an upper GI bleed, clipping.  Dr. Carlean Purl.  01/03/2020.  From last visit: CAD status post DES to the LAD and PLA 10/2009, DES x2 to the [place] 08/2018, echo 07/2018 LVEF 60 to 65% with grade 1 DD, diastolic CHF, pulmonary sarcoidosis, history of GI bleed, adrenal insufficiency on chronic steroids, hypertension, HLD, PAD with left tibial disease treated medically, OSA, CKD.   Patient was last seen by [name], PA-C 10/11/2019 having symptoms of dyspnea on exertion and chest pain but was also Hemoccult positive and having a GI work-up.  He decrease losartan to 50 mg daily and started isosorbide 30 mg daily.  Patient was admitted with a GI bleed 12/2019 requiring transfusion.  Endoscopy showed small amount of fresh blood in the duodenal bulb but no bleeding site and 2 small polyps were removed.  Colonoscopy no active bleeding.  They resumed his Plavix and aspirin.    Has since had 2 surgeries on his left foot at [facility] in December for charcot's and osteomyelitis. HR was up and down during the second foot surgery and was in ICU for a couple days. Didn't change his meds. Now with right knee problems.   Complains of chest pain and DOE with little activity for about 2 months. Hgb dropped from 12.9 to 9.3 on report he got yesterday.  Iron is low and may need Iron infusion. He didn't tolerate Imdur-says it made his chest hurt worse. Was at beach last week and ate out. Swollen ankles/legs since. A1C over 10. On chronic steroids for his lungs. Using walker and knee brace.   He has had his left foot operated on at [facility], limb preservation.  He was told previously here that he would need to lose his left leg below the knee.  Overall after his COVID infection in August of this year, he continues to have significant shortness of breath.  This is worrying him.  Excessive fatigue.  Hemoglobin has been stable 12.3 he states.  No active GI bleeding.  Past Medical History:  Diagnosis Date   Adrenal insufficiency ([place])    Allergy    Anemia    Arthritis    feet    Broken foot 09/2019   had to wore a boot. Left    CKD (chronic kidney disease), stage III (HCC)    Coronary artery disease    has stents   Coronary atherosclerosis of native coronary artery    Proximal LAD, posterior lateral stent widely patent-10/12/11   Diabetes mellitus    insulin and pills   Hearing loss    wears hearing aids   Heart attack [facility]) 2010   History of blood transfusion 06/30/2016   [facility] - 2 units  transfused   Hypertension    OSA (obstructive sleep apnea)    uses VPAC sleep study 2 years done through [facility]. Dr. Marlou Porch arranged study   Pneumonia    3-4 years ago   Sarcoid    Sarcoidosis    Sleep apnea    uses CPAP nightly   Thyroid disease    Type 2 diabetes mellitus ([place])     Past Surgical History:  Procedure Laterality Date   APPENDECTOMY     CATARACT EXTRACTION  2011   bilat   CHOLECYSTECTOMY  01/25/2011   Procedure: LAPAROSCOPIC CHOLECYSTECTOMY WITH INTRAOPERATIVE CHOLANGIOGRAM;  Surgeon: Judieth Keens, DO;  Location: [facility];  Service: General;  Laterality: N/A;   COLONOSCOPY     several   COLONOSCOPY WITH PROPOFOL N/A 01/03/2020   Procedure: COLONOSCOPY WITH PROPOFOL;  Surgeon: Gatha Mayer, MD;  Location: [facility]  ENDOSCOPY;  Service: Endoscopy;  Laterality: N/A;   CORONARY ANGIOPLASTY     most recent 11/2009   CORONARY STENT INTERVENTION N/A 08/07/2018   Procedure: CORONARY STENT INTERVENTION;  Surgeon: Jettie Booze, MD;  Location: [place] CV LAB;  Service: Cardiovascular;  Laterality: N/A;   CORONARY STENT PLACEMENT  2009   in LAD and side branch PTCA   ESOPHAGOGASTRODUODENOSCOPY (EGD) WITH PROPOFOL N/A 01/03/2020   Procedure: ESOPHAGOGASTRODUODENOSCOPY (EGD) WITH PROPOFOL;  Surgeon: Gatha Mayer, MD;  Location: [facility] ENDOSCOPY;  Service: Endoscopy;  Laterality: N/A;   HEMOSTASIS CLIP PLACEMENT  01/03/2020   Procedure: HEMOSTASIS CLIP PLACEMENT;  Surgeon: Gatha Mayer, MD;  Location: [facility] ENDOSCOPY;  Service: Endoscopy;;   INTERCOSTAL NERVE BLOCK  2011, 06/2016   x2. lumbar spine   LEFT HEART CATHETERIZATION WITH CORONARY ANGIOGRAM Bilateral 10/12/2011   Procedure: LEFT HEART CATHETERIZATION WITH CORONARY ANGIOGRAM;  Surgeon: Candee Furbish, MD;  Location: [facility] CATH LAB;  Service: Cardiovascular;  Laterality: Bilateral;   LEFT HEART CATHETERIZATION WITH CORONARY ANGIOGRAM N/A 04/01/2014   Procedure: LEFT HEART CATHETERIZATION WITH CORONARY ANGIOGRAM;  Surgeon: Candee Furbish, MD;  Location: [facility] CATH LAB;  Service: Cardiovascular;  Laterality: N/A;   POLYPECTOMY  01/03/2020   Procedure: POLYPECTOMY;  Surgeon: Gatha Mayer, MD;  [facility] ENDOSCOPY;  Service: Endoscopy;;   RIGHT/LEFT HEART CATH AND CORONARY ANGIOGRAPHY N/A 08/07/2018   Procedure: RIGHT/LEFT HEART CATH AND CORONARY ANGIOGRAPHY;  Surgeon: Jettie Booze, MD;  Location: [place] CV LAB;  Service: Cardiovascular;  Laterality: N/A;    Current Medications: Current Meds  Medication Sig   acetaminophen (TYLENOL) 500 MG tablet Take 500 mg by mouth daily as needed for moderate pain or headache.    albuterol (PROVENTIL) (2.5 MG/3ML) 0.083% nebulizer solution Take 3 mLs (2.5 mg total) by nebulization every 6 (six) hours as needed for  wheezing or shortness of breath. Dx Code D86.0   Albuterol Sulfate (PROAIR RESPICLICK) 409 (90 BASE) MCG/ACT AEPB Inhale 2 puffs into the lungs every 6 (six) hours as needed. (Patient taking differently: Inhale 2 puffs into the lungs every 6 (six) hours as needed (sinus).)   amLODipine (NORVASC) 5 MG tablet Take 1 tablet (5 mg total) by mouth daily. (Patient not taking: No sig reported)   aspirin EC 81 MG tablet Take 81 mg by mouth every evening.    Cholecalciferol (VITAMIN D3) 3000 UNITS TABS Take 3,000 Units by mouth once a week.   clopidogrel (PLAVIX) 75 MG tablet Take 1 tablet (75 mg total) by mouth daily.   Coenzyme Q10 (COQ-10) 400 MG CAPS Take 400 mg by mouth 3 (  three) times a week.   DROPLET INSULIN SYRINGE 31G X 5/16" 1 ML MISC    furosemide (LASIX) 80 MG tablet Take 1 tablet (80 mg total) by mouth daily.   glipiZIDE (GLUCOTROL) 10 MG tablet Take 10 mg by mouth every evening.   insulin aspart protamine- aspart (NOVOLOG MIX 70/30) (70-30) 100 UNIT/ML injection Inject 0.3 mLs (30 Units total) into the skin 2 (two) times daily with a meal. (Patient taking differently: Inject 35 Units into the skin 2 (two) times daily with a meal.)   levothyroxine (SYNTHROID, LEVOTHROID) 50 MCG tablet Take 50 mcg by mouth daily.    losartan (COZAAR) 100 MG tablet Take 100 mg by mouth daily.   montelukast (SINGULAIR) 10 MG tablet Take 10 mg by mouth daily as needed (allergies).    Multiple Vitamin (MULTIVITAMIN WITH MINERALS) TABS tablet Take 1 tablet by mouth daily.   nitroGLYCERIN (NITROSTAT) 0.4 MG SL tablet Place 1 tablet (0.4 mg total) under the tongue every 5 (five) minutes as needed for chest pain.   pantoprazole (PROTONIX) 40 MG tablet Take 40 mg by mouth daily.   predniSONE (DELTASONE) 10 MG tablet Take 1 tablet (10 mg total) by mouth daily with breakfast. T   RABEprazole (ACIPHEX) 20 MG tablet Take 20 mg by mouth daily.   rosuvastatin (CRESTOR) 10 MG tablet TAKE 1 TABLET AT BEDTIME (Patient taking  differently: Take 10 mg by mouth once a week.)   [DISCONTINUED] brimonidine (ALPHAGAN) 0.2 % ophthalmic solution Place 1 drop into the left eye daily.    [DISCONTINUED] fish oil-omega-3 fatty acids 1000 MG capsule Take 1 g by mouth daily.    [DISCONTINUED] Vitamin D, Ergocalciferol, (DRISDOL) 50000 UNITS CAPS capsule Take 50,000 Units by mouth every Sunday.      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   Occupation: MANAGER    Employer: [employer]  Tobacco Use   Smoking status: Never   Smokeless tobacco: Never  Vaping Use   Vaping Use: Never used  Substance and Sexual Activity   Alcohol use: No   Drug use: No   Sexual activity: Not on file  Other Topics Concern   Not on file  Social History Narrative   Not on file   Social Determinants of Health   Financial Resource Strain: Not on file  Food Insecurity: Not on file  Transportation Needs: Not on file  Physical Activity: Not on file  Stress: Not on file  Social Connections: Not on file     Family History: The patient's family history includes Anesthesia problems in his sister; Asthma in his sister; Diabetes in his mother; Heart attack in his father; Kidney cancer in his mother; Kidney disease in his mother; Sarcoidosis in his niece and sister. There is no history of Colon cancer, Colon polyps, Rectal cancer, or Stomach cancer.  ROS:   Please see the history of present illness.     All other systems reviewed and are negative.  EKGs/Labs/Other Studies Reviewed:    The following studies were reviewed today:  R/L cardiac catheterization 08/07/2018 LAD proximal stent patent with 40 ISR LCx mid 50 RCA  proximal 25; RPDA ostial  80, 70; RP AV stent patent Mean PA 18, mean PCWP 12 PCI: 2.25 x 8 mm Resolute Onyx DES and 2 x 3 mm Resolute Onyx DES to the RPDA   Echocardiogram 07/27/2018 EF 60-65, mild LVH, GR 1 DD, normal RVSF  ECHO 07/2018:    1. The left ventricle has normal systolic function with an ejection  fraction of 60-65%. The cavity size was normal. There is mildly increased  left ventricular wall thickness. Left ventricular diastolic Doppler  parameters are consistent with impaired  relaxation.   2. The right ventricle has normal systolic function. The cavity was  normal. There is no increase in right ventricular wall thickness.   3. The aortic valve is tricuspid. Mild thickening of the aortic valve.  Mild calcification of the aortic valve.   Cath 08/07/2018: Diagnostic Dominance: Right Intervention    Chest CT 2021: Chronic changes consistent with the given clinical history of sarcoidosis  EKG:  EKG is  ordered today.  The ekg ordered today demonstrates sinus rhythm 74 left axis deviation.  No significant change from prior EKG personally reviewed.  Recent Labs: 01/01/2020: ALT 36 12/24/2020: BUN 28; Creatinine, Ser 1.49; Hemoglobin 10.2; Platelets 343; Potassium 4.5; Sodium 140  Recent Lipid Panel    Component Value Date/Time   CHOL 219 (H) 07/27/2018 1239   TRIG 269 (H) 07/27/2018 1239   HDL 32 (L) 07/27/2018 1239   CHOLHDL 6.8 (H) 07/27/2018 1239   CHOLHDL 11.4 08/15/2008 0408   VLDL 41 (H) 08/15/2008 0408   LDLCALC 133 (H) 07/27/2018 1239     Risk Assessment/Calculations:          Physical Exam:    VS:  BP 140/60 (BP Location: Left Arm, Patient Position: Sitting, Cuff Size: Normal)   Pulse 74   Ht 5\' 10"  (1.778 m)   Wt 225 lb (102.1 kg)   SpO2 97%   BMI 32.28 kg/m     Wt Readings from Last 3 Encounters:  12/24/20 225 lb (102.1 kg)  07/21/20 229 lb 2 oz (103.9 kg)  07/01/20 234 lb (106.1 kg)     GEN:  Well nourished, well developed in no  acute distress HEENT: Normal NECK: No JVD; No carotid bruits LYMPHATICS: No lymphadenopathy CARDIAC: RRR, no murmurs, rubs, gallops RESPIRATORY:  Clear to auscultation without rales, wheezing or rhonchi  ABDOMEN: Soft, non-tender, non-distended MUSCULOSKELETAL:  No edema; No deformity  SKIN: Warm and dry NEUROLOGIC:  Alert and oriented x 3 PSYCHIATRIC:  Normal affect   ASSESSMENT:    1. Coronary artery disease involving native coronary artery of native heart without angina pectoris   2. Atherosclerosis of native coronary artery of native heart, unspecified whether angina present   3. Coronary artery disease involving native coronary artery of native heart with angina pectoris ([place])   4. Pre-procedure lab exam   5. Shortness of breath    PLAN:    In order of problems listed above:  Coronary atherosclerosis of native coronary artery Feeling worse, more anginal type symptoms.  He feels as though he may have a possible stenosis.  We will go ahead and check a right and left heart catheterization given his excessive shortness of breath.   We had lengthy discussion about potential risks and benefits of this procedure especially given his ongoing comorbidities.  Last creatinine in the 1.3 range.  He is willing to proceed.  We will check an echocardiogram as well.  Shared Decision Making/Informed Consent The risks [stroke (1 in 1000), death (1  in 1000), kidney failure [usually temporary] (1 in 500), bleeding (1 in 200), allergic reaction [possibly serious] (1 in 200)], benefits (diagnostic support and management of coronary artery disease) and alternatives of a cardiac catheterization were discussed in detail with Mr. Azzarello and he is willing to proceed.    Medication Adjustments/Labs and Tests Ordered: Current medicines are reviewed at length with the patient today.  Concerns regarding medicines are outlined above.  Orders Placed This Encounter  Procedures   Basic metabolic panel   CBC    EKG 12-Lead   ECHOCARDIOGRAM COMPLETE    No orders of the defined types were placed in this encounter.   Patient Instructions  Medication Instructions:  The current medical regimen is effective;  continue present plan and medications.  *If you need a refill on your cardiac medications before your next appointment, please call your pharmacy*   Lab Work: Please have blood work today (BMP, CBC)  If you have labs (blood work) drawn today and your tests are completely normal, you will receive your results only by: MyChart Message (if you have MyChart) OR A paper copy in the mail If you have any lab test that is abnormal or we need to change your treatment, we will call you to review the results.   Testing/Procedures: Your physician has requested that you have an echocardiogram. Echocardiography is a painless test that uses sound waves to create images of your heart. It provides your doctor with information about the size and shape of your heart and how well your heart's chambers and valves are working. This procedure takes approximately one hour. There are no restrictions for this procedure.    [facility] OFFICE [place], [place] [address] Dept: [phone] Loc: [place]  12/24/2020  You are scheduled for a Cardiac Catheterization on Friday, October 21 with Dr. Daneen Schick.  1. Please arrive at the [facility] (Main Entrance A) at [facility]: [address] [place], [place] [address] at 5:30 AM (two hours before your procedure to ensure your preparation). Free valet parking service is available.   Special note: Every effort is made to have your procedure done on time. Please understand that emergencies sometimes delay scheduled procedures.  2. Diet: Do not eat or drink anything after midnight prior to your procedure except sips of water to take  medications.  3. Labs:  today (BMP,CBC)  4. Medication instructions in preparation for your procedure:  Please hold Insulin and Glipizide the morning of. Hold Furosemide the morning of your cath.  On the morning of your procedure, take your  Plavix and any morning medicines NOT listed above.  You may use sips of water.  5. Plan for one night stay--bring personal belongings. 6. Bring a current list of your medications and current insurance cards. 7. You MUST have a responsible person to drive you home. 8. Someone MUST be with you the first 24 hours after you arrive home or your discharge will be delayed. 9. Please wear clothes that are easy to get on and off and wear slip-on shoes.  Thank you for allowing [place] to care for you!   --  Invasive Cardiovascular services  Follow-Up: At [facility], you and your health needs are our priority.  As part of our continuing mission to provide you with exceptional heart care, we have created designated Provider Care Teams.  These Care Teams include  your primary Cardiologist (physician) and Advanced Practice Providers (APPs -  Physician Assistants and Nurse Practitioners) who all work together to provide you with the care you need, when you need it.  We recommend signing up for the patient portal called "MyChart".  Sign up information is provided on this After Visit Summary.  MyChart is used to connect with patients for Virtual Visits (Telemedicine).  Patients are able to view lab/test results, encounter notes, upcoming appointments, etc.  Non-urgent messages can be sent to your provider as well.   To learn more about what you can do with MyChart, go to [email or web address].    Your next appointment:   1 month(s)  The format for your next appointment:   In Person  Provider:   You will see one of the following Advanced Practice Providers on your designated Care Team:    Any PA or NP  Thank you for choosing [facility]!!     Signed, Candee Furbish, MD  12/24/2020 5:11 PM    [place]

## 2020-12-25 ENCOUNTER — Encounter (HOSPITAL_COMMUNITY): Payer: Self-pay | Admitting: Interventional Cardiology

## 2020-12-25 ENCOUNTER — Other Ambulatory Visit: Payer: Self-pay

## 2020-12-25 ENCOUNTER — Encounter (HOSPITAL_COMMUNITY)
Admission: RE | Disposition: A | Discharge status: Home / Self Care | Payer: Self-pay | Source: Home / Self Care | Attending: Interventional Cardiology

## 2020-12-25 ENCOUNTER — Ambulatory Visit (HOSPITAL_COMMUNITY)
Admission: RE | Admit: 2020-12-25 | Discharge: 2020-12-25 | Disposition: A | Discharge status: Home / Self Care | Payer: Medicare HMO | Attending: Interventional Cardiology | Admitting: Interventional Cardiology

## 2020-12-25 DIAGNOSIS — I2510 Atherosclerotic heart disease of native coronary artery without angina pectoris: Secondary | ICD-10-CM

## 2020-12-25 DIAGNOSIS — Z7902 Long term (current) use of antithrombotics/antiplatelets: Secondary | ICD-10-CM | POA: Insufficient documentation

## 2020-12-25 DIAGNOSIS — Z955 Presence of coronary angioplasty implant and graft: Secondary | ICD-10-CM | POA: Insufficient documentation

## 2020-12-25 DIAGNOSIS — R0609 Other forms of dyspnea: Secondary | ICD-10-CM

## 2020-12-25 DIAGNOSIS — Z7982 Long term (current) use of aspirin: Secondary | ICD-10-CM | POA: Diagnosis not present

## 2020-12-25 DIAGNOSIS — I5032 Chronic diastolic (congestive) heart failure: Secondary | ICD-10-CM | POA: Diagnosis not present

## 2020-12-25 DIAGNOSIS — R0602 Shortness of breath: Secondary | ICD-10-CM | POA: Insufficient documentation

## 2020-12-25 DIAGNOSIS — I272 Pulmonary hypertension, unspecified: Secondary | ICD-10-CM | POA: Diagnosis not present

## 2020-12-25 DIAGNOSIS — N189 Chronic kidney disease, unspecified: Secondary | ICD-10-CM | POA: Diagnosis not present

## 2020-12-25 DIAGNOSIS — N1831 Chronic kidney disease, stage 3a: Secondary | ICD-10-CM | POA: Diagnosis present

## 2020-12-25 DIAGNOSIS — D860 Sarcoidosis of lung: Secondary | ICD-10-CM | POA: Diagnosis present

## 2020-12-25 DIAGNOSIS — I739 Peripheral vascular disease, unspecified: Secondary | ICD-10-CM | POA: Diagnosis present

## 2020-12-25 DIAGNOSIS — N183 Chronic kidney disease, stage 3 unspecified: Secondary | ICD-10-CM | POA: Diagnosis present

## 2020-12-25 HISTORY — PX: RIGHT/LEFT HEART CATH AND CORONARY ANGIOGRAPHY: CATH118266

## 2020-12-25 LAB — POCT I-STAT 7, (LYTES, BLD GAS, ICA,H+H)
Acid-Base Excess: 1 mmol/L (ref 0.0–2.0)
Acid-Base Excess: 1 mmol/L (ref 0.0–2.0)
Acid-Base Excess: 1 mmol/L (ref 0.0–2.0)
Bicarbonate: 27.4 mmol/L (ref 20.0–28.0)
Bicarbonate: 27.5 mmol/L (ref 20.0–28.0)
Bicarbonate: 26.5 mmol/L (ref 20.0–28.0)
Calcium, Ion: 1.19 mmol/L (ref 1.15–1.40)
Calcium, Ion: 1.21 mmol/L (ref 1.15–1.40)
Calcium, Ion: 1.14 mmol/L — ABNORMAL LOW (ref 1.15–1.40)
HCT: 26 % — ABNORMAL LOW (ref 39.0–52.0)
HCT: 27 % — ABNORMAL LOW (ref 39.0–52.0)
HCT: 26 % — ABNORMAL LOW (ref 39.0–52.0)
Hemoglobin: 8.8 g/dL — ABNORMAL LOW (ref 13.0–17.0)
Hemoglobin: 9.2 g/dL — ABNORMAL LOW (ref 13.0–17.0)
Hemoglobin: 8.8 g/dL — ABNORMAL LOW (ref 13.0–17.0)
O2 Saturation: 71 %
O2 Saturation: 72 %
O2 Saturation: 100 %
Potassium: 4.1 mmol/L (ref 3.5–5.1)
Potassium: 4.2 mmol/L (ref 3.5–5.1)
Potassium: 4 mmol/L (ref 3.5–5.1)
Sodium: 141 mmol/L (ref 135–145)
Sodium: 141 mmol/L (ref 135–145)
Sodium: 139 mmol/L (ref 135–145)
TCO2: 29 mmol/L (ref 22–32)
TCO2: 29 mmol/L (ref 22–32)
TCO2: 28 mmol/L (ref 22–32)
pCO2 arterial: 52.4 mmHg — ABNORMAL HIGH (ref 32.0–48.0)
pCO2 arterial: 53.3 mmHg — ABNORMAL HIGH (ref 32.0–48.0)
pCO2 arterial: 48.6 mmHg — ABNORMAL HIGH (ref 32.0–48.0)
pH, Arterial: 7.319 — ABNORMAL LOW (ref 7.350–7.450)
pH, Arterial: 7.328 — ABNORMAL LOW (ref 7.350–7.450)
pH, Arterial: 7.345 — ABNORMAL LOW (ref 7.350–7.450)
pO2, Arterial: 40 mmHg — CL (ref 83.0–108.0)
pO2, Arterial: 42 mmHg — ABNORMAL LOW (ref 83.0–108.0)
pO2, Arterial: 203 mmHg — ABNORMAL HIGH (ref 83.0–108.0)

## 2020-12-25 LAB — GLUCOSE, CAPILLARY: Glucose-Capillary: 228 mg/dL — ABNORMAL HIGH (ref 70–99)

## 2020-12-25 LAB — POCT ACTIVATED CLOTTING TIME: Activated Clotting Time: 219 seconds

## 2020-12-25 SURGERY — RIGHT/LEFT HEART CATH AND CORONARY ANGIOGRAPHY
Anesthesia: LOCAL

## 2020-12-25 MED ORDER — LIDOCAINE HCL (PF) 1 % IJ SOLN
INTRAMUSCULAR | Status: AC
  Filled 2020-12-25: qty 30

## 2020-12-25 MED ORDER — LIDOCAINE HCL (PF) 1 % IJ SOLN
INTRAMUSCULAR | Status: DC | PRN
  Administered 2020-12-25 (×2): 2 mL

## 2020-12-25 MED ORDER — HEPARIN SODIUM (PORCINE) 1000 UNIT/ML IJ SOLN
INTRAMUSCULAR | Status: DC | PRN
  Administered 2020-12-25: 5000 [IU] via INTRAVENOUS
  Administered 2020-12-25: 4000 [IU] via INTRAVENOUS

## 2020-12-25 MED ORDER — FENTANYL CITRATE (PF) 100 MCG/2ML IJ SOLN
INTRAMUSCULAR | Status: AC
  Filled 2020-12-25: qty 2

## 2020-12-25 MED ORDER — IOHEXOL 350 MG/ML SOLN
INTRAVENOUS | Status: DC | PRN
  Administered 2020-12-25: 110 mL

## 2020-12-25 MED ORDER — HEPARIN SODIUM (PORCINE) 1000 UNIT/ML IJ SOLN
INTRAMUSCULAR | Status: AC
  Filled 2020-12-25: qty 1

## 2020-12-25 MED ORDER — SODIUM CHLORIDE 0.9% FLUSH: 3.0000 mL | Freq: Two times a day (BID) | INTRAVENOUS | Status: DC

## 2020-12-25 MED ORDER — HYDRALAZINE HCL 20 MG/ML IJ SOLN: 10.0000 mg | INTRAMUSCULAR | Status: DC | PRN

## 2020-12-25 MED ORDER — HEPARIN (PORCINE) IN NACL 1000-0.9 UT/500ML-% IV SOLN
INTRAVENOUS | Status: AC
  Filled 2020-12-25: qty 1000

## 2020-12-25 MED ORDER — SODIUM CHLORIDE 0.9 % WEIGHT BASED INFUSION
3.0000 mL/kg/h | INTRAVENOUS | Status: AC
  Administered 2020-12-25: 3 mL/kg/h via INTRAVENOUS

## 2020-12-25 MED ORDER — FENTANYL CITRATE (PF) 100 MCG/2ML IJ SOLN
INTRAMUSCULAR | Status: DC | PRN
  Administered 2020-12-25 (×2): 25 ug via INTRAVENOUS

## 2020-12-25 MED ORDER — ASPIRIN 81 MG PO CHEW
81.0000 mg | CHEWABLE_TABLET | ORAL | Status: AC
  Administered 2020-12-25: 81 mg via ORAL
  Filled 2020-12-25: qty 1

## 2020-12-25 MED ORDER — CLOPIDOGREL BISULFATE 75 MG PO TABS
75.0000 mg | ORAL_TABLET | ORAL | Status: AC
  Administered 2020-12-25: 75 mg via ORAL
  Filled 2020-12-25: qty 1

## 2020-12-25 MED ORDER — VERAPAMIL HCL 2.5 MG/ML IV SOLN
INTRAVENOUS | Status: AC
  Filled 2020-12-25: qty 2

## 2020-12-25 MED ORDER — SODIUM CHLORIDE 0.9 % IV SOLN: INTRAVENOUS | Status: DC

## 2020-12-25 MED ORDER — SODIUM CHLORIDE 0.9 % IV SOLN: 250.0000 mL | INTRAVENOUS | Status: DC | PRN

## 2020-12-25 MED ORDER — SODIUM CHLORIDE 0.9 % WEIGHT BASED INFUSION
1.0000 mL/kg/h | INTRAVENOUS | Status: DC
  Administered 2020-12-25: 1 mL/kg/h via INTRAVENOUS

## 2020-12-25 MED ORDER — NITROGLYCERIN 1 MG/10 ML FOR IR/CATH LAB
INTRA_ARTERIAL | Status: AC
  Filled 2020-12-25: qty 10

## 2020-12-25 MED ORDER — HEPARIN (PORCINE) IN NACL 1000-0.9 UT/500ML-% IV SOLN
INTRAVENOUS | Status: DC | PRN
  Administered 2020-12-25 (×2): 500 mL

## 2020-12-25 MED ORDER — OXYCODONE HCL 5 MG PO TABS: 5.0000 mg | ORAL_TABLET | ORAL | Status: DC | PRN

## 2020-12-25 MED ORDER — ONDANSETRON HCL 4 MG/2ML IJ SOLN: 4.0000 mg | Freq: Four times a day (QID) | INTRAMUSCULAR | Status: DC | PRN

## 2020-12-25 MED ORDER — ACETAMINOPHEN 325 MG PO TABS: 650.0000 mg | ORAL_TABLET | ORAL | Status: DC | PRN

## 2020-12-25 MED ORDER — LABETALOL HCL 5 MG/ML IV SOLN: 10.0000 mg | INTRAVENOUS | Status: DC | PRN

## 2020-12-25 MED ORDER — SODIUM CHLORIDE 0.9% FLUSH: 3.0000 mL | INTRAVENOUS | Status: DC | PRN

## 2020-12-25 MED ORDER — VERAPAMIL HCL 2.5 MG/ML IV SOLN
INTRAVENOUS | Status: DC | PRN
  Administered 2020-12-25: 10 mL via INTRA_ARTERIAL

## 2020-12-25 MED ORDER — MIDAZOLAM HCL 2 MG/2ML IJ SOLN
INTRAMUSCULAR | Status: AC
  Filled 2020-12-25: qty 2

## 2020-12-25 MED ORDER — MIDAZOLAM HCL 2 MG/2ML IJ SOLN
INTRAMUSCULAR | Status: DC | PRN
  Administered 2020-12-25: .5 mg via INTRAVENOUS
  Administered 2020-12-25: 1 mg via INTRAVENOUS

## 2020-12-25 SURGICAL SUPPLY — 14 items
CATH 5FR JL3.5 JR4 ANG PIG MP (CATHETERS) ×2 IMPLANT
CATH BALLN WEDGE 5F 110CM (CATHETERS) ×2 IMPLANT
CATH VISTA GUIDE 6FR JR4 (CATHETERS) ×2 IMPLANT
DEVICE RAD COMP TR BAND LRG (VASCULAR PRODUCTS) ×2 IMPLANT
GLIDESHEATH SLEND A-KIT 6F 22G (SHEATH) ×2 IMPLANT
GUIDEWIRE INQWIRE 1.5J.035X260 (WIRE) ×1 IMPLANT
GUIDEWIRE PRESSURE X 175 (WIRE) ×2 IMPLANT
INQWIRE 1.5J .035X260CM (WIRE) ×2
KIT ESSENTIALS PG (KITS) ×2 IMPLANT
KIT HEART LEFT (KITS) ×2 IMPLANT
PACK CARDIAC CATHETERIZATION (CUSTOM PROCEDURE TRAY) ×2 IMPLANT
SHEATH GLIDE SLENDER 4/5FR (SHEATH) ×2 IMPLANT
TRANSDUCER W/STOPCOCK (MISCELLANEOUS) ×2 IMPLANT
TUBING CIL FLEX 10 FLL-RA (TUBING) ×2 IMPLANT

## 2020-12-25 NOTE — CV Procedure (Addendum)
Diffuse three-vessel coronary disease. New tandem RCA stenoses 75% proximal and 60% mid but with negative physiology for ischemia. Mid anterior wall hypokinesis.  EF 45 to 50%.  EDP normal. Wedge upper normal. Without obvious culprit, and CKD with creatinine in the recent past > 2.1, did not embark upon any multilesion assessment.  RCA could be treated but without physiology suggesting ischemia, medical therapy is most appropriate for now.

## 2020-12-25 NOTE — Discharge Instructions (Signed)
4 hour hydration before DC.

## 2020-12-25 NOTE — Interval H&P Note (Signed)
Cath Lab Visit (complete for each Cath Lab visit)  Clinical Evaluation Leading to the Procedure:   ACS: No.  Non-ACS:    Anginal Classification: CCS III  Anti-ischemic medical therapy: Maximal Therapy (2 or more classes of medications)  Non-Invasive Test Results: No non-invasive testing performed  Prior CABG: No previous CABG      History and Physical Interval Note:  12/25/2020 7:22 AM  [name]  has presented today for surgery, with the diagnosis of shortness of breath - cad.  The various methods of treatment have been discussed with the patient and family. After consideration of risks, benefits and other options for treatment, the patient has consented to  Procedure(s): RIGHT/LEFT HEART CATH AND CORONARY ANGIOGRAPHY (N/A) as a surgical intervention.  The patient's history has been reviewed, patient examined, no change in status, stable for surgery.  I have reviewed the patient's chart and labs.  Questions were answered to the patient's satisfaction.     Belva Crome III

## 2020-12-30 ENCOUNTER — Telehealth: Payer: Self-pay | Admitting: Cardiology

## 2020-12-30 DIAGNOSIS — I25119 Atherosclerotic heart disease of native coronary artery with unspecified angina pectoris: Secondary | ICD-10-CM

## 2020-12-30 DIAGNOSIS — Z01812 Encounter for preprocedural laboratory examination: Secondary | ICD-10-CM

## 2020-12-30 NOTE — Telephone Encounter (Signed)
Left message for patient to call back  

## 2020-12-30 NOTE — Telephone Encounter (Signed)
Pt had a Cath on Friday 12/25/20 by Dr. Tamala Julian, pt was advised that Dr. Marlou Porch will give him a call early part of this week, pt have not received a phonecall

## 2020-12-31 NOTE — Telephone Encounter (Signed)
Called and spoke with pt.  Reviewed his cardiac cath results and the recommendation from both Dr Tamala Julian and Dr Marlou Porch to continue medication management and initiation of Jardiance 10 mg daily.  Reviewed this information in detail with pt.  He states he doesn't understand why he was sent home with "so many blockages" and that he can not do anything anymore because of his SOB.  He reports any medication he has tried has only caused him more problems and hasn't helped anything.  He would like to discuss this with Dr Marlou Porch.  Attempted to schedule pt to be seen in the office however he refused at this time and repeatedly said "just have Dr Marlou Porch to call me."    Will forward this information to Dr Marlou Porch for his review and knowledge.

## 2021-01-04 ENCOUNTER — Encounter: Payer: Self-pay | Admitting: *Deleted

## 2021-01-04 ENCOUNTER — Telehealth: Payer: Self-pay | Admitting: Cardiology

## 2021-01-04 NOTE — Telephone Encounter (Signed)
Patient wanted to speak to Dr. Marlou Porch about the procedure he had done 12/25/20.

## 2021-01-04 NOTE — Telephone Encounter (Signed)
See phone number from 10/26 for further documentation / orders.

## 2021-01-04 NOTE — Telephone Encounter (Signed)
Dr Marlou Porch has contacted the pt.  Results and plan reviewed.  Pt will be scheduled for l eft cath with Dr Ezzie Dural.

## 2021-01-04 NOTE — Telephone Encounter (Signed)
PATIENT HAS BEEN SCHEDULED FOR CARDIAC CATH WITH DR COOPER  Monday 11/14 - to be there at 7 am for 9 am case.  Having lab Wednesday 11/9 (CBC,BMP) Last EKG 10/24.  All instructions review but letter to instructions to be mailed to pt's home address.

## 2021-01-06 DIAGNOSIS — S90822A Blister (nonthermal), left foot, initial encounter: Secondary | ICD-10-CM | POA: Diagnosis not present

## 2021-01-06 DIAGNOSIS — E11621 Type 2 diabetes mellitus with foot ulcer: Secondary | ICD-10-CM | POA: Diagnosis not present

## 2021-01-06 DIAGNOSIS — L97409 Non-pressure chronic ulcer of unspecified heel and midfoot with unspecified severity: Secondary | ICD-10-CM | POA: Diagnosis not present

## 2021-01-06 DIAGNOSIS — Z8631 Personal history of diabetic foot ulcer: Secondary | ICD-10-CM | POA: Diagnosis not present

## 2021-01-06 DIAGNOSIS — E11610 Type 2 diabetes mellitus with diabetic neuropathic arthropathy: Secondary | ICD-10-CM | POA: Diagnosis not present

## 2021-01-06 NOTE — Telephone Encounter (Signed)
Letter of instructions mailed to pt's home address 01/04/2021.  Information sent to precert and note sent to Dr Marlou Porch requesting orders. Echo rescheduled from 11/14 to 11/18.

## 2021-01-13 ENCOUNTER — Other Ambulatory Visit: Payer: Self-pay

## 2021-01-13 ENCOUNTER — Other Ambulatory Visit: Payer: Medicare HMO

## 2021-01-13 DIAGNOSIS — I25119 Atherosclerotic heart disease of native coronary artery with unspecified angina pectoris: Secondary | ICD-10-CM

## 2021-01-13 DIAGNOSIS — Z01812 Encounter for preprocedural laboratory examination: Secondary | ICD-10-CM

## 2021-01-13 LAB — BASIC METABOLIC PANEL
BUN/Creatinine Ratio: 18 (ref 10–24)
BUN: 24 mg/dL (ref 8–27)
CO2: 27 mmol/L (ref 20–29)
Calcium: 9.2 mg/dL (ref 8.6–10.2)
Chloride: 99 mmol/L (ref 96–106)
Creatinine, Ser: 1.33 mg/dL — ABNORMAL HIGH (ref 0.76–1.27)
Glucose: 362 mg/dL — ABNORMAL HIGH (ref 70–99)
Potassium: 4 mmol/L (ref 3.5–5.2)
Sodium: 136 mmol/L (ref 134–144)
eGFR: 56 mL/min/{1.73_m2} — ABNORMAL LOW (ref >59)

## 2021-01-13 LAB — CBC
Hematocrit: 32.3 % — ABNORMAL LOW (ref 37.5–51.0)
Hemoglobin: 10.2 g/dL — ABNORMAL LOW (ref 13.0–17.7)
MCH: 29 pg (ref 26.6–33.0)
MCHC: 31.6 g/dL (ref 31.5–35.7)
MCV: 92 fL (ref 79–97)
Platelets: 371 10*3/uL (ref 150–450)
RBC: 3.52 x10E6/uL — ABNORMAL LOW (ref 4.14–5.80)
RDW: 16 % — ABNORMAL HIGH (ref 11.6–15.4)
WBC: 11.8 10*3/uL — ABNORMAL HIGH (ref 3.4–10.8)

## 2021-01-14 ENCOUNTER — Telehealth: Payer: Self-pay | Admitting: *Deleted

## 2021-01-14 DIAGNOSIS — M1990 Unspecified osteoarthritis, unspecified site: Secondary | ICD-10-CM | POA: Diagnosis not present

## 2021-01-14 NOTE — Telephone Encounter (Signed)
Reviewed procedure/mask/visitor instructions with patient. 

## 2021-01-14 NOTE — Telephone Encounter (Signed)
FFR possible PCI scheduled at [facility] for: Monday January 18, 2021 [facility] Main Entrance A [facility]) at: 7 AM   No solid food after midnight prior to cath, clear liquids until 5 AM day of procedure.  Medication instructions: Hold: Lasix-day before and day of procedure-GFR 56-per protocol Losartan-day before and day of procedure-GFR 56-per protocol Insulin-AM of procedure Glipizide-AM of procedure  Except hold medications usual morning medications can be taken pre-cath with sips of water including: -aspirin 81 mg -Plavix 75 mg    Confirmed patient has responsible adult to drive home post procedure and be with patient first 24 hours after arriving home.  [facility] does allow one visitor to accompany you and wait in the hospital waiting room while you are there for your procedure. You and your visitor will be asked to wear a mask once you enter the hospital.   Patient reports does not currently have any new symptoms concerning for COVID-19 and no household members with COVID-19 like illness.    Left message for patient to call back to review procedure instructions.

## 2021-01-14 NOTE — Telephone Encounter (Signed)
Patient is returning call to discuss procedure instructions.

## 2021-01-18 ENCOUNTER — Encounter (HOSPITAL_COMMUNITY)
Admission: RE | Disposition: A | Discharge status: Home / Self Care | Payer: Self-pay | Source: Home / Self Care | Attending: Cardiovascular Disease

## 2021-01-18 ENCOUNTER — Other Ambulatory Visit: Payer: Self-pay

## 2021-01-18 ENCOUNTER — Ambulatory Visit (HOSPITAL_COMMUNITY)
Admission: RE | Admit: 2021-01-18 | Discharge: 2021-01-18 | Disposition: A | Discharge status: Home / Self Care | Payer: Medicare HMO | Attending: Cardiovascular Disease | Admitting: Cardiovascular Disease

## 2021-01-18 ENCOUNTER — Other Ambulatory Visit (HOSPITAL_COMMUNITY): Payer: Medicare HMO

## 2021-01-18 DIAGNOSIS — G4733 Obstructive sleep apnea (adult) (pediatric): Secondary | ICD-10-CM | POA: Insufficient documentation

## 2021-01-18 DIAGNOSIS — I5032 Chronic diastolic (congestive) heart failure: Secondary | ICD-10-CM | POA: Insufficient documentation

## 2021-01-18 DIAGNOSIS — N183 Chronic kidney disease, stage 3 unspecified: Secondary | ICD-10-CM | POA: Diagnosis not present

## 2021-01-18 DIAGNOSIS — E1122 Type 2 diabetes mellitus with diabetic chronic kidney disease: Secondary | ICD-10-CM | POA: Insufficient documentation

## 2021-01-18 DIAGNOSIS — I130 Hypertensive heart and chronic kidney disease with heart failure and stage 1 through stage 4 chronic kidney disease, or unspecified chronic kidney disease: Secondary | ICD-10-CM | POA: Insufficient documentation

## 2021-01-18 DIAGNOSIS — E1151 Type 2 diabetes mellitus with diabetic peripheral angiopathy without gangrene: Secondary | ICD-10-CM | POA: Diagnosis not present

## 2021-01-18 DIAGNOSIS — Z955 Presence of coronary angioplasty implant and graft: Secondary | ICD-10-CM

## 2021-01-18 DIAGNOSIS — R0602 Shortness of breath: Secondary | ICD-10-CM | POA: Diagnosis not present

## 2021-01-18 DIAGNOSIS — I25119 Atherosclerotic heart disease of native coronary artery with unspecified angina pectoris: Secondary | ICD-10-CM | POA: Insufficient documentation

## 2021-01-18 DIAGNOSIS — Z8616 Personal history of COVID-19: Secondary | ICD-10-CM | POA: Diagnosis not present

## 2021-01-18 DIAGNOSIS — I25118 Atherosclerotic heart disease of native coronary artery with other forms of angina pectoris: Secondary | ICD-10-CM | POA: Diagnosis not present

## 2021-01-18 DIAGNOSIS — E785 Hyperlipidemia, unspecified: Secondary | ICD-10-CM | POA: Diagnosis not present

## 2021-01-18 DIAGNOSIS — R5383 Other fatigue: Secondary | ICD-10-CM | POA: Insufficient documentation

## 2021-01-18 HISTORY — PX: CORONARY STENT INTERVENTION: CATH118234

## 2021-01-18 HISTORY — PX: INTRAVASCULAR PRESSURE WIRE/FFR STUDY: CATH118243

## 2021-01-18 LAB — GLUCOSE, CAPILLARY
Glucose-Capillary: 125 mg/dL — ABNORMAL HIGH (ref 70–99)
Glucose-Capillary: 104 mg/dL — ABNORMAL HIGH (ref 70–99)

## 2021-01-18 LAB — POCT ACTIVATED CLOTTING TIME
Activated Clotting Time: 254 seconds
Activated Clotting Time: 266 seconds

## 2021-01-18 SURGERY — INTRAVASCULAR PRESSURE WIRE/FFR STUDY
Anesthesia: LOCAL

## 2021-01-18 MED ORDER — SODIUM CHLORIDE 0.9 % WEIGHT BASED INFUSION
3.0000 mL/kg/h | INTRAVENOUS | Status: AC
  Administered 2021-01-18: 3 mL/kg/h via INTRAVENOUS

## 2021-01-18 MED ORDER — NITROGLYCERIN 1 MG/10 ML FOR IR/CATH LAB
INTRA_ARTERIAL | Status: AC
  Filled 2021-01-18: qty 10

## 2021-01-18 MED ORDER — ACETAMINOPHEN 325 MG PO TABS: 650.0000 mg | ORAL_TABLET | ORAL | Status: DC | PRN

## 2021-01-18 MED ORDER — SODIUM CHLORIDE 0.9% FLUSH: 3.0000 mL | INTRAVENOUS | Status: DC | PRN

## 2021-01-18 MED ORDER — IOHEXOL 350 MG/ML SOLN
INTRAVENOUS | Status: DC | PRN
  Administered 2021-01-18: 110 mL

## 2021-01-18 MED ORDER — LIDOCAINE HCL (PF) 1 % IJ SOLN
INTRAMUSCULAR | Status: AC
  Filled 2021-01-18: qty 30

## 2021-01-18 MED ORDER — SODIUM CHLORIDE 0.9 % IV SOLN: 250.0000 mL | INTRAVENOUS | Status: DC | PRN

## 2021-01-18 MED ORDER — HEPARIN SODIUM (PORCINE) 1000 UNIT/ML IJ SOLN
INTRAMUSCULAR | Status: AC
  Filled 2021-01-18: qty 1

## 2021-01-18 MED ORDER — SODIUM CHLORIDE 0.9% FLUSH: 3.0000 mL | Freq: Two times a day (BID) | INTRAVENOUS | Status: DC

## 2021-01-18 MED ORDER — ASPIRIN 81 MG PO CHEW: 81.0000 mg | CHEWABLE_TABLET | ORAL | Status: DC

## 2021-01-18 MED ORDER — LIDOCAINE HCL (PF) 1 % IJ SOLN
INTRAMUSCULAR | Status: DC | PRN
  Administered 2021-01-18: 2 mL

## 2021-01-18 MED ORDER — HYDRALAZINE HCL 20 MG/ML IJ SOLN: 10.0000 mg | INTRAMUSCULAR | Status: DC | PRN

## 2021-01-18 MED ORDER — MIDAZOLAM HCL 2 MG/2ML IJ SOLN
INTRAMUSCULAR | Status: AC
  Filled 2021-01-18: qty 2

## 2021-01-18 MED ORDER — FENTANYL CITRATE (PF) 100 MCG/2ML IJ SOLN
INTRAMUSCULAR | Status: DC | PRN
  Administered 2021-01-18: 25 ug via INTRAVENOUS
  Administered 2021-01-18: 50 ug

## 2021-01-18 MED ORDER — ADENOSINE (DIAGNOSTIC) 140MCG/KG/MIN
INTRAVENOUS | Status: DC | PRN
  Administered 2021-01-18: 140 ug/kg/min via INTRAVENOUS

## 2021-01-18 MED ORDER — ADENOSINE 12 MG/4ML IV SOLN
INTRAVENOUS | Status: AC
  Filled 2021-01-18: qty 16

## 2021-01-18 MED ORDER — HEPARIN SODIUM (PORCINE) 1000 UNIT/ML IJ SOLN
INTRAMUSCULAR | Status: DC | PRN
  Administered 2021-01-18: 12000 [IU] via INTRAVENOUS
  Administered 2021-01-18: 3000 [IU] via INTRAVENOUS
  Administered 2021-01-18: 4000 [IU] via INTRAVENOUS

## 2021-01-18 MED ORDER — SODIUM CHLORIDE 0.9 % WEIGHT BASED INFUSION: 1.0000 mL/kg/h | INTRAVENOUS | Status: DC

## 2021-01-18 MED ORDER — HEPARIN (PORCINE) IN NACL 1000-0.9 UT/500ML-% IV SOLN
INTRAVENOUS | Status: AC
  Filled 2021-01-18: qty 1000

## 2021-01-18 MED ORDER — HEPARIN (PORCINE) IN NACL 1000-0.9 UT/500ML-% IV SOLN
INTRAVENOUS | Status: DC | PRN
  Administered 2021-01-18 (×2): 500 mL

## 2021-01-18 MED ORDER — NITROGLYCERIN 1 MG/10 ML FOR IR/CATH LAB
INTRA_ARTERIAL | Status: DC | PRN
  Administered 2021-01-18 (×5): 150 ug via INTRACORONARY

## 2021-01-18 MED ORDER — ONDANSETRON HCL 4 MG/2ML IJ SOLN: 4.0000 mg | Freq: Four times a day (QID) | INTRAMUSCULAR | Status: DC | PRN

## 2021-01-18 MED ORDER — CLOPIDOGREL BISULFATE 75 MG PO TABS: 75.0000 mg | ORAL_TABLET | ORAL | Status: DC

## 2021-01-18 MED ORDER — LABETALOL HCL 5 MG/ML IV SOLN: 10.0000 mg | INTRAVENOUS | Status: DC | PRN

## 2021-01-18 MED ORDER — VERAPAMIL HCL 2.5 MG/ML IV SOLN
INTRAVENOUS | Status: DC | PRN
  Administered 2021-01-18: 10 mL via INTRA_ARTERIAL

## 2021-01-18 MED ORDER — VERAPAMIL HCL 2.5 MG/ML IV SOLN
INTRAVENOUS | Status: AC
  Filled 2021-01-18: qty 2

## 2021-01-18 MED ORDER — MIDAZOLAM HCL 2 MG/2ML IJ SOLN
INTRAMUSCULAR | Status: DC | PRN
  Administered 2021-01-18: 2 mg via INTRAVENOUS

## 2021-01-18 MED ORDER — FENTANYL CITRATE (PF) 100 MCG/2ML IJ SOLN
INTRAMUSCULAR | Status: AC
  Filled 2021-01-18: qty 2

## 2021-01-18 MED FILL — Fentanyl Citrate Preservative Free (PF) Inj 100 MCG/2ML: INTRAMUSCULAR | Qty: 2 | Status: AC

## 2021-01-18 SURGICAL SUPPLY — 20 items
BALLN SAPPHIRE 2.5X12 (BALLOONS) ×2
BALLN SAPPHIRE ~~LOC~~ 4.0X12 (BALLOONS) ×1 IMPLANT
BALLOON SAPPHIRE 2.5X12 (BALLOONS) IMPLANT
CATH VISTA GUIDE 6FR JR4 (CATHETERS) ×1 IMPLANT
CATH VISTA GUIDE 6FR XBLAD3.5 (CATHETERS) ×1 IMPLANT
DEVICE RAD COMP TR BAND LRG (VASCULAR PRODUCTS) ×1 IMPLANT
GLIDESHEATH SLEND SS 6F .021 (SHEATH) ×1 IMPLANT
GUIDEWIRE INQWIRE 1.5J.035X260 (WIRE) IMPLANT
GUIDEWIRE PRESSURE X 175 (WIRE) ×1 IMPLANT
INQWIRE 1.5J .035X260CM (WIRE) ×2
KIT ENCORE 26 ADVANTAGE (KITS) ×1 IMPLANT
KIT ESSENTIALS PG (KITS) ×2 IMPLANT
KIT HEART LEFT (KITS) ×2 IMPLANT
KIT HEMO VALVE WATCHDOG (MISCELLANEOUS) ×1 IMPLANT
PACK CARDIAC CATHETERIZATION (CUSTOM PROCEDURE TRAY) ×2 IMPLANT
STENT ONYX FRONTIER 2.5X18 (Permanent Stent) ×1 IMPLANT
STENT ONYX FRONTIER 3.5X15 (Permanent Stent) ×1 IMPLANT
STENT ONYX FRONTIER 3.5X18 (Permanent Stent) ×1 IMPLANT
TRANSDUCER W/STOPCOCK (MISCELLANEOUS) ×2 IMPLANT
WIRE COUGAR XT STRL 190CM (WIRE) ×1 IMPLANT

## 2021-01-18 NOTE — Discharge Summary (Signed)
Discharge Summary for Same Day PCI   Patient ID: [name] MRN: [id number]; DOB: Oct 07, 1945  Admit date: 01/18/2021 Discharge date: 01/18/2021  Primary Care Provider: Shirline Frees, MD  Primary Cardiologist: Candee Furbish, MD  Primary Electrophysiologist:  None   Discharge Diagnoses    Principal Problem:   Coronary artery disease with exertional angina [facility]) Active Problems:   Hyperlipidemia  Diagnostic Studies/Procedures    Cardiac Catheterization 01/18/2021:   Prox LAD-1 lesion is 40% stenosed.   Prox LAD-2 lesion is 65% stenosed.   Mid Cx lesion is 50% stenosed.   Mid RCA lesion is 60% stenosed.   Prox RCA lesion is 75% stenosed.   Ost Ramus to Ramus lesion is 70% stenosed.   Ost 1st Diag lesion is 80% stenosed.   Non-stenotic Ost RPDA lesion was previously treated.   Non-stenotic Ost RPDA to RPDA lesion was previously treated.   Non-stenotic RPAV lesion was previously treated.   A drug-eluting stent was successfully placed using a STENT ONYX FRONTIER 3.5X15.   A drug-eluting stent was successfully placed using a STENT ONYX FRONTIER 3.5X18.   A drug-eluting stent was successfully placed using a STENT ONYX FRONTIER 2.5X18.   Post intervention, there is a 0% residual stenosis.   Post intervention, there is a 0% residual stenosis.   Post intervention, there is a 0% residual stenosis.   Successful two-vessel PCI guided by RFR/FFR with stenting of severe stenoses in the proximal and mid RCA and severe stenosis in the first diagonal branch of the LAD.  All lesions treated with Onyx frontier drug-eluting stents (total of 3 stents implanted).  Patient should be treated with dual antiplatelet therapy with aspirin and clopidogrel for minimum of 6 months, favor long-term therapy if well-tolerated considering his multivessel stenting.  Same-day PCI protocol.   Diagnostic Dominance: Right Intervention   _____________   History of Present Illness     [name] is a 75 y.o.  male with PMH of CAD with multiple stents, HTN, HLD, DM, anemia, CKD stage III who is followed by Dr. Marlou Porch as an outpatient.  He has complex prior history of CAD with stenting to the LAD/PLA '11, DES x2 to the RPDA '20, along with diastolic CHF, pulmonary sarcoidosis, GI bleed, adrenal insufficiency on chronic steroids, PAD with left tibial disease treated medically and obstructive sleep apnea.  He was seen in the office on 10/2019 with [name] with complaints of dyspnea on exertion and chest pain but was also noted to be Hemoccult positive and undergoing GI work-up.  He was admitted with a GI bleed 12/2019 and required transfusion.  Endoscopy showed small amount of blood in the duodenal bulb and no active bleeding with 2 small polyps removed.  Colonoscopy with no active bleeding.  He was resumed on his aspirin/Plavix.  Later had 2 surgeries on his left foot at [facility] for Charcot's foot and osteomyelitis.  Was last seen in the office on 12/2020 with Dr. Marlou Porch and complained of chest pain and dyspnea on exertion for 2 months prior to visit.  He was sent for outpatient cardiac catheterization which noted diffuse three-vessel CAD with moderate LAD disease beyond stented segment, moderate severe diagonal disease, 70% ramus and new 75 to 80% proximal (RFR 0.90)  and 60% mid distal RCA lesion (RFR 0.91).  Recommendations were conservative management with medical therapy along with initiation of SGLT2 therapy.  Notes indicate RCA could be treated although no physiologic evidence to support doing so the day of cath.  Patient called back to the office with recurrent chest pain and spoke with Dr. Marlou Porch regarding plan and he was set up for relook outpatient cardiac cath.  Hospital Course     The patient underwent cardiac cath as noted above with successful two-vessel PCI guided FFR/RFR with stenting of severe stenosis of the proximal to mid RCA along with severe stenosis of first diagonal branch of the LAD. DES  x2 to RCA along with DES x1 to diagonal branch.  Plan for DAPT with ASA/Plavix for at least 6 months but likely longer if patient tolerating well given multivessel stenting. The patient was seen by cardiac rehab while in short stay. There were no observed complications post cath. Radial cath site was re-evaluated prior to discharge and found to be stable without any complications. Instructions/precautions regarding cath site care were given prior to discharge.  [name] was seen by Dr. Burt Knack and determined stable for discharge home. Follow up with our office has been arranged. Medications are listed below. Pertinent changes include N/a.  _____________  Cath/PCI Registry Performance & Quality Measures: Aspirin prescribed? - Yes ADP Receptor Inhibitor (Plavix/Clopidogrel, Brilinta/Ticagrelor or Effient/Prasugrel) prescribed (includes medically managed patients)? - Yes High Intensity Statin (Lipitor 40-80mg  or Crestor 20-40mg ) prescribed? - No - intolerant For EF <40%, was ACEI/ARB prescribed? - Not Applicable (EF >/= 22%) For EF <40%, Aldosterone Antagonist (Spironolactone or Eplerenone) prescribed? - Not Applicable (EF >/= 97%) Cardiac Rehab Phase II ordered (Included Medically managed Patients)? - Yes  _____________   Discharge Vitals Blood pressure (!) 141/61, pulse 65, temperature 98.5 F (36.9 C), temperature source Oral, resp. rate (!) 6, height 5\' 10"  (1.778 m), weight 101.6 kg, SpO2 100 %.  Filed Weights   01/18/21 0708  Weight: 101.6 kg    Last Labs & Radiologic Studies    CBC No results for input(s): WBC, NEUTROABS, HGB, HCT, MCV, PLT in the last 72 hours. Basic Metabolic Panel No results for input(s): NA, K, CL, CO2, GLUCOSE, BUN, CREATININE, CALCIUM, MG, PHOS in the last 72 hours. Liver Function Tests No results for input(s): AST, ALT, ALKPHOS, BILITOT, PROT, ALBUMIN in the last 72 hours. No results for input(s): LIPASE, AMYLASE in the last 72 hours. High Sensitivity  Troponin:   No results for input(s): TROPONINIHS in the last 720 hours.  BNP Invalid input(s): POCBNP D-Dimer No results for input(s): DDIMER in the last 72 hours. Hemoglobin A1C No results for input(s): HGBA1C in the last 72 hours. Fasting Lipid Panel No results for input(s): CHOL, HDL, LDLCALC, TRIG, CHOLHDL, LDLDIRECT in the last 72 hours. Thyroid Function Tests No results for input(s): TSH, T4TOTAL, T3FREE, THYROIDAB in the last 72 hours.  Invalid input(s): FREET3 _____________  CARDIAC CATHETERIZATION  Result Date: 01/18/2021   Prox LAD-1 lesion is 40% stenosed.   Prox LAD-2 lesion is 65% stenosed.   Mid Cx lesion is 50% stenosed.   Mid RCA lesion is 60% stenosed.   Prox RCA lesion is 75% stenosed.   Ost Ramus to Ramus lesion is 70% stenosed.   Ost 1st Diag lesion is 80% stenosed.   Non-stenotic Ost RPDA lesion was previously treated.   Non-stenotic Ost RPDA to RPDA lesion was previously treated.   Non-stenotic RPAV lesion was previously treated.   A drug-eluting stent was successfully placed using a STENT ONYX FRONTIER 3.5X15.   A drug-eluting stent was successfully placed using a STENT ONYX FRONTIER 3.5X18.   A drug-eluting stent was successfully placed using a STENT ONYX FRONTIER 2.5X18.  Post intervention, there is a 0% residual stenosis.   Post intervention, there is a 0% residual stenosis.   Post intervention, there is a 0% residual stenosis. Successful two-vessel PCI guided by RFR/FFR with stenting of severe stenoses in the proximal and mid RCA and severe stenosis in the first diagonal branch of the LAD.  All lesions treated with Onyx frontier drug-eluting stents (total of 3 stents implanted).  Patient should be treated with dual antiplatelet therapy with aspirin and clopidogrel for minimum of 6 months, favor long-term therapy if well-tolerated considering his multivessel stenting.  Same-day PCI protocol.   CARDIAC CATHETERIZATION  Result Date: 12/25/2020 CONCLUSIONS: Diffuse  three-vessel coronary disease is noted on diagram with moderate LAD beyond the stented segment, moderately severe diagonal, moderate less than or equal to 70% ramus, new 75 to 80% proximal and 60% mid to distal RCA.  Left main is unremarkable. Physiology assessment across the tandem right coronary lesions did not demonstrate ischemia (RFR 0.91). Low normal to mildly depressed LV function, EF 45 to 50%.  EDP 17 mmHg. Mild pulmonary hypertension with mean pressure 71 mmHg.  Wedge pressure 10 mmHg. RECOMMENDATIONS: Given chronic kidney disease, multiple lesions, and no specific target that is clearly a culprit, my approach was more conservative. Recommend consideration of SGLT2 therapy for component of chronic midrange combined systolic and diastolic heart failure. RCA could be treated although no physiologic support noted today. Diagonal is largely unchanged.    Disposition   Pt is being discharged home today in good condition.  Follow-up Plans & Appointments     Follow-up Information     Liliane Shi, PA-C Follow up on 01/26/2021.   Specialties: Cardiology, Physician Assistant Why: at 8:50am for your follow up appt with Dr. Antionette Char PA Nicki Reaper Contact information: [address] [place] [address] [phone]                Discharge Instructions     Amb Referral to Cardiac Rehabilitation   Complete by: As directed    Diagnosis:  Coronary Stents PTCA     After initial evaluation and assessments completed: Virtual Based Care may be provided alone or in conjunction with Phase 2 Cardiac Rehab based on patient barriers.: Yes        Discharge Medications   Allergies as of 01/18/2021       Reactions   Statins Other (See Comments)   Pt says they make him "mean"   Hydrocortisone Nausea Only   Azithromycin    Raises blood sugar   Miralax [polyethylene Glycol] Nausea And Vomiting   Patient refuses to take- reminded me today of this. ta   Metolazone Other  (See Comments)   Drained, no energy        Medication List     TAKE these medications    acetaminophen 500 MG tablet Commonly known as: TYLENOL Take 500 mg by mouth daily as needed for moderate pain or headache.   Albuterol Sulfate 108 (90 Base) MCG/ACT Aepb Commonly known as: ProAir RespiClick Inhale 2 puffs into the lungs every 6 (six) hours as needed. What changed: reasons to take this   albuterol (2.5 MG/3ML) 0.083% nebulizer solution Commonly known as: PROVENTIL Take 3 mLs (2.5 mg total) by nebulization every 6 (six) hours as needed for wheezing or shortness of breath. Dx Code D86.0 What changed: Another medication with the same name was changed. Make sure you understand how and when to take each.   amLODipine 5 MG tablet  Commonly known as: NORVASC Take 1 tablet (5 mg total) by mouth daily.   aspirin EC 81 MG tablet Take 81 mg by mouth every evening.   clopidogrel 75 MG tablet Commonly known as: PLAVIX Take 1 tablet (75 mg total) by mouth daily.   CoQ-10 400 MG Caps Take 400 mg by mouth 3 (three) times a week.   Droplet Insulin Syringe 31G X 5/16" 1 ML Misc Generic drug: Insulin Syringe-Needle U-100   furosemide 80 MG tablet Commonly known as: LASIX Take 1 tablet (80 mg total) by mouth daily.   glipiZIDE 10 MG tablet Commonly known as: GLUCOTROL Take 10 mg by mouth every evening.   insulin aspart protamine- aspart (70-30) 100 UNIT/ML injection Commonly known as: NOVOLOG MIX 70/30 Inject 0.3 mLs (30 Units total) into the skin 2 (two) times daily with a meal. What changed: how much to take   levothyroxine 50 MCG tablet Commonly known as: SYNTHROID Take 50 mcg by mouth daily.   lidocaine 5 % Commonly known as: LIDODERM Place 1 patch onto the skin daily as needed for pain.   losartan 100 MG tablet Commonly known as: COZAAR Take 100 mg by mouth daily.   montelukast 10 MG tablet Commonly known as: SINGULAIR Take 10 mg by mouth daily as needed  (allergies).   multivitamin with minerals Tabs tablet Take 1 tablet by mouth daily.   nitroGLYCERIN 0.4 MG SL tablet Commonly known as: NITROSTAT Place 1 tablet (0.4 mg total) under the tongue every 5 (five) minutes as needed for chest pain.   OVER THE COUNTER MEDICATION Take 1 tablet by mouth daily. Omaga XL   pantoprazole 40 MG tablet Commonly known as: PROTONIX Take 40 mg by mouth daily.   predniSONE 10 MG tablet Commonly known as: DELTASONE Take 1 tablet (10 mg total) by mouth daily with breakfast. T   RABEprazole 20 MG tablet Commonly known as: ACIPHEX Take 20 mg by mouth daily.   rosuvastatin 10 MG tablet Commonly known as: CRESTOR TAKE 1 TABLET AT BEDTIME What changed: when to take this   Vitamin D3 75 MCG (3000 UT) Tabs Take 3,000 Units by mouth once a week.        Allergies Allergies  Allergen Reactions   Statins Other (See Comments)    Pt says they make him "mean"   Hydrocortisone Nausea Only   Azithromycin     Raises blood sugar   Miralax [Polyethylene Glycol] Nausea And Vomiting    Patient refuses to take- reminded me today of this. ta   Metolazone Other (See Comments)    Drained, no energy   Outstanding Labs/Studies   N/a   Duration of Discharge Encounter   Greater than 30 minutes including physician time.  Signed, Reino Bellis, NP 01/18/2021, 2:28 PM

## 2021-01-18 NOTE — Interval H&P Note (Signed)
History and Physical Interval Note:  01/18/2021 10:07 AM  [name]  has presented today for surgery, with the diagnosis of cad.  The various methods of treatment have been discussed with the patient and family. After consideration of risks, benefits and other options for treatment, the patient has consented to  Procedure(s): INTRAVASCULAR PRESSURE WIRE/FFR STUDY (N/A) as a surgical intervention.  The patient's history has been reviewed, patient examined, no change in status, stable for surgery.  I have reviewed the patient's chart and labs.  Questions were answered to the patient's satisfaction.    Patient has undergone diagnostic cardiac catheterization and RFR evaluation of the right coronary artery.  He continues to have CCS functional class III symptoms of exertional angina and is extremely limited on his medical therapy.  He presents today for FFR evaluation of the RCA and probable PCI of the LAD in order to relieve his angina that has been refractory to medical therapy.  Cath Lab Visit (complete for each Cath Lab visit)  Clinical Evaluation Leading to the Procedure:   ACS: No.  Non-ACS:    Anginal Classification: CCS III  Anti-ischemic medical therapy: Minimal Therapy (1 class of medications)  Non-Invasive Test Results: No non-invasive testing performed  Prior CABG: No previous CABG         Sherren Mocha

## 2021-01-18 NOTE — Progress Notes (Signed)
Discussed stents, restrictions, Plavix, diet, exercise, NTG, and CRPII with pt and wife. Receptive. Will refer to [place]. Pt has been limited to short distances walking due to foot/knee.Understands the importance of Plavix. [place], ACSM 1:52 PM 01/18/2021

## 2021-01-18 NOTE — Discharge Instructions (Signed)

## 2021-01-19 ENCOUNTER — Encounter (HOSPITAL_COMMUNITY): Payer: Self-pay | Admitting: Cardiovascular Disease

## 2021-01-19 ENCOUNTER — Telehealth (HOSPITAL_COMMUNITY): Payer: Self-pay | Admitting: Cardiology

## 2021-01-19 NOTE — Telephone Encounter (Signed)
Patient called and cancelled echocardiogram for the reason below:  01/19/2021 1:12 PM [id number], [name]  Cancel Rsn: Patient (had stints put in)   Order will be removed from the active Echo wq. IF patient needs in future we can reinstate the order.

## 2021-01-22 ENCOUNTER — Other Ambulatory Visit (HOSPITAL_COMMUNITY): Payer: Medicare HMO

## 2021-01-25 NOTE — Progress Notes (Signed)
Office Visit    Patient Name: [name] Date of Encounter: 01/26/2021  PCP:  Shirline Frees, MD   [facility]  Cardiologist:  Candee Furbish, MD  Advanced Practice Provider:  No care team member to display Electrophysiologist:  None    Chief Complaint    [name] is a 75 y.o. male with a hx of CAD status post DES to the LAD and PDA August 2011, DES x2 to the R PDA June 0258, grade 1 diastolic dysfunction, pulmonary sarcoidosis, history of GI bleed, adrenal insufficiency on chronic steroids, hypertension, hyperlipidemia, PAD with left tibial disease treated medically, OSA, and CKD presents today for a hospital follow-up appointment.  Past Medical History    Past Medical History:  Diagnosis Date   Adrenal insufficiency ([place])    Allergy    Anemia    Arthritis    feet    Broken foot 09/2019   had to wore a boot. Left    CKD (chronic kidney disease), stage III (HCC)    Coronary artery disease    has stents   Coronary atherosclerosis of native coronary artery    Proximal LAD, posterior lateral stent widely patent-10/12/11   Diabetes mellitus    insulin and pills   Hearing loss    wears hearing aids   Heart attack ([place]) 2010   History of blood transfusion 06/30/2016   [facility] - 2 units transfused   Hypertension    OSA (obstructive sleep apnea)    uses VPAC sleep study 2 years done through [place]. Dr. Marlou Porch arranged study   Pneumonia    3-4 years ago   Sarcoid    Sarcoidosis    Sleep apnea    uses CPAP nightly   Thyroid disease    Type 2 diabetes mellitus ([place])    Past Surgical History:  Procedure Laterality Date   APPENDECTOMY     CATARACT EXTRACTION  2011   bilat   CHOLECYSTECTOMY  01/25/2011   Procedure: LAPAROSCOPIC CHOLECYSTECTOMY WITH INTRAOPERATIVE CHOLANGIOGRAM;  Surgeon: Judieth Keens, DO;  Location: [facility];  Service: General;  Laterality: N/A;   COLONOSCOPY     several   COLONOSCOPY WITH PROPOFOL N/A 01/03/2020    Procedure: COLONOSCOPY WITH PROPOFOL;  Surgeon: Gatha Mayer, MD;  Location: [facility];  Service: Endoscopy;  Laterality: N/A;   CORONARY ANGIOPLASTY     most recent 11/2009   CORONARY STENT INTERVENTION N/A 08/07/2018   Procedure: CORONARY STENT INTERVENTION;  Surgeon: Jettie Booze, MD;  Location: [place] [facility];  Service: Cardiovascular;  Laterality: N/A;   CORONARY STENT INTERVENTION N/A 01/18/2021   Procedure: CORONARY STENT INTERVENTION;  Surgeon: Sherren Mocha, MD;  Location: [place] [facility];  Service: Cardiovascular;  Laterality: N/A;   CORONARY STENT PLACEMENT  2009   in LAD and side branch PTCA   ESOPHAGOGASTRODUODENOSCOPY (EGD) WITH PROPOFOL N/A 01/03/2020   Procedure: ESOPHAGOGASTRODUODENOSCOPY (EGD) WITH PROPOFOL;  Surgeon: Gatha Mayer, MD;  [facility];  Service: Endoscopy;  Laterality: N/A;   HEMOSTASIS CLIP PLACEMENT  01/03/2020   Procedure: HEMOSTASIS CLIP PLACEMENT;  Surgeon: Gatha Mayer, MD;  Location: [facility];  Service: Endoscopy;;   INTERCOSTAL NERVE BLOCK  2011, 06/2016   x2. lumbar spine   INTRAVASCULAR PRESSURE WIRE/FFR STUDY N/A 01/18/2021   Procedure: INTRAVASCULAR PRESSURE WIRE/FFR STUDY;  Surgeon: Sherren Mocha, MD;  Location: [place] [facility];  Service: Cardiovascular;  Laterality: N/A;   LEFT HEART CATHETERIZATION WITH  CORONARY ANGIOGRAM Bilateral 10/12/2011   Procedure: LEFT HEART CATHETERIZATION WITH CORONARY ANGIOGRAM;  Surgeon: Candee Furbish, MD;  Location: [facility];  Service: Cardiovascular;  Laterality: Bilateral;   LEFT HEART CATHETERIZATION WITH CORONARY ANGIOGRAM N/A 04/01/2014   Procedure: LEFT HEART CATHETERIZATION WITH CORONARY ANGIOGRAM;  Surgeon: Candee Furbish, MD;  Location: [facility] C[facility] LAB;  Service: Cardiovascular;  Laterality: N/A;   POLYPECTOMY  01/03/2020   Procedure: POLYPECTOMY;  Surgeon: Gatha Mayer, MD;  [facility];  Service: Endoscopy;;   RIGHT/LEFT HEART CATH AND CORONARY ANGIOGRAPHY  N/A 08/07/2018   Procedure: RIGHT/LEFT HEART CATH AND CORONARY ANGIOGRAPHY;  Surgeon: Jettie Booze, MD;  Location: [place] [facility];  Service: Cardiovascular;  Laterality: N/A;   RIGHT/LEFT HEART CATH AND CORONARY ANGIOGRAPHY N/A 12/25/2020   Procedure: RIGHT/LEFT HEART CATH AND CORONARY ANGIOGRAPHY;  Surgeon: Belva Crome, MD;  Location: [place] [facility];  Service: Cardiovascular;  Laterality: N/A;    Allergies  Allergies  Allergen Reactions   Statins Other (See Comments)    Pt says they make him "mean"   Hydrocortisone Nausea Only   Azithromycin     Raises blood sugar   Miralax [Polyethylene Glycol] Nausea And Vomiting    Patient refuses to take- reminded me today of this. ta   Metolazone Other (See Comments)    Drained, no energy    History of Present Illness    [name] is a 75 y.o. male with a hx of CAD status post DES to the LAD and PDA August 2011, DES x2 to the R PDA June 8921, grade 1 diastolic dysfunction, pulmonary sarcoidosis, history of GI bleed, adrenal insufficiency on chronic steroids, hypertension, hyperlipidemia, PAD with left tibial disease treated medically, OSA, and CKD presents today for a hospital follow-up appointment.   He was seen in the office on 10/2019 with [name] with complaints of dyspnea on exertion and chest pain but was also noted to be Hemoccult positive and undergoing GI work-up. He was admitted with a GI bleed 12/2019 and required transfusion. Endoscopy showed small amount of blood in the duodenal bulb and no active bleeding with 2 small polyps removed. Colonoscopy with no active bleeding. He was resumed on his aspirin/Plavix.   Later had 2 surgeries on his left foot at [facility] for Charcot's foot and osteomyelitis.  Was last seen in the office on 12/2020 with Dr. Marlou Porch and complained of chest pain and dyspnea on exertion for 2 months prior to visit.  He was sent for outpatient cardiac catheterization which noted diffuse three-vessel  CAD with moderate LAD disease beyond stented segment, moderate severe diagonal disease, 70% ramus and new 75 to 80% proximal (RFR 0.90)  and 60% mid distal RCA lesion (RFR 0.91).  Recommendations were conservative management with medical therapy along with initiation of SGLT2 therapy.  Notes indicate RCA could be treated although no physiologic evidence to support doing so the day of cath. Patient called back to the office with recurrent chest pain and spoke with Dr. Marlou Porch regarding plan and he was set up for relook outpatient cardiac cath.   Patient underwent cardiac catheterization and subsequently underwent successful two-vessel PCI guided FFR/R [place] with stenting of severe stenosis of the proximal to mid RCA along with severe stenosis of the first diagonal branch of the LAD.  DES x2 to RCA along with DES x1 to diagonal branch.  Plan for DAPT with ASA/Plavix for at least 6 months but likely longer if patient tolerates well.  We are seeing him today for follow-up after stent placement.  Today, he is having some positional dizziness which is also notices during activity. His breathing is better.  He is having some dizziness with activity and positional dizziness.  His blood pressure at home is running 671I to 458K systolic.  He does notice occasionally that his heart beats a little faster during these episodes.  He told me that he had an arrhythmia at the time of his last stent placement but has never worn a monitor.  He has no chest pain and overall feels well and is walking further than he ever has.  He has been breathing much better ever since his stent placement.  His activity has been somewhat limited due to his foot surgery and walking in a boot.  He stated that he has been dealing with his foot issue for over a year.  His appetite has been picking up and he has been eating a lot of salad and toast with cheese.  He was given a heart healthy diet education packet before he left the hospital.  He plans to  do cardiac rehab however [place] has a wait list of 3 months.  He expressed interest in doing his rehab over at [facility].  He states he has done his rehab over there in the past for his heart.  Our office is working on sending a referral there.  We will go ahead and order a fasting lipid panel as well as LFTs today since he has not had those labs in 2 years.  He has been taking his Crestor and tolerating it.  He will continue on dual antiplatelet therapy for at least 6 months.  Reports no shortness of breath nor dyspnea on exertion. Reports no chest pain, pressure, or tightness. No edema, orthopnea, PND.   EKGs/Labs/Other Studies Reviewed:   The following studies were reviewed today:  Cardic Cath with PCI 01/18/21    Prox LAD-1 lesion is 40% stenosed.   Prox LAD-2 lesion is 65% stenosed.   Mid Cx lesion is 50% stenosed.   Mid RCA lesion is 60% stenosed.   Prox RCA lesion is 75% stenosed.   Ost Ramus to Ramus lesion is 70% stenosed.   Ost 1st Diag lesion is 80% stenosed.   Non-stenotic Ost RPDA lesion was previously treated.   Non-stenotic Ost RPDA to RPDA lesion was previously treated.   Non-stenotic RPAV lesion was previously treated.   A drug-eluting stent was successfully placed using a STENT ONYX FRONTIER 3.5X15.   A drug-eluting stent was successfully placed using a STENT ONYX FRONTIER 3.5X18.   A drug-eluting stent was successfully placed using a STENT ONYX FRONTIER 2.5X18.   Post intervention, there is a 0% residual stenosis.   Post intervention, there is a 0% residual stenosis.   Post intervention, there is a 0% residual stenosis.   Successful two-vessel PCI guided by RFR/FFR with stenting of severe stenoses in the proximal and mid RCA and severe stenosis in the first diagonal branch of the LAD.  All lesions treated with Onyx frontier drug-eluting stents (total of 3 stents implanted).  Patient should be treated with dual antiplatelet therapy with aspirin and  clopidogrel for minimum of 6 months, favor long-term therapy if well-tolerated considering his multivessel stenting.  Same-day PCI protocol.    07/27/2018 Echocardiogram   1. The left ventricle has normal systolic function with an ejection  fraction of 60-65%. The cavity size was normal. There is mildly increased  left ventricular wall thickness. Left ventricular diastolic Doppler  parameters are consistent with impaired  relaxation.   2. The right ventricle has normal systolic function. The cavity was  normal. There is no increase in right ventricular wall thickness.   3. The aortic valve is tricuspid. Mild thickening of the aortic valve.  Mild calcification of the aortic valve.   FINDINGS   Left Ventricle: The left ventricle has normal systolic function, with an  ejection fraction of 60-65%. The cavity size was normal. There is mildly  increased left ventricular wall thickness. Left ventricular diastolic  Doppler parameters are consistent  with impaired relaxation. Normal left ventricular filling pressures   Right Ventricle: The right ventricle has normal systolic function. The  cavity was normal. There is no increase in right ventricular wall  thickness.   Left Atrium: Left atrial size was normal in size.   Right Atrium: Right atrial size was normal in size. Right atrial pressure  is estimated at 10 mmHg.   Interatrial Septum: No atrial level shunt detected by color flow Doppler.   Pericardium: There is no evidence of pericardial effusion. There is a  pericardial fat pad noted.   Mitral Valve: The mitral valve is normal in structure. Mitral valve  regurgitation is not visualized by color flow Doppler.   Tricuspid Valve: The tricuspid valve is normal in structure. Tricuspid  valve regurgitation is trivial by color flow Doppler.   Aortic Valve: The aortic valve is tricuspid Mild thickening of the aortic  valve. Mild calcification of the aortic valve. Aortic valve  regurgitation  was not visualized by color flow Doppler. There is no evidence of aortic  valve stenosis.   Pulmonic Valve: The pulmonic valve was not well visualized. Pulmonic valve  regurgitation is not visualized by color flow Doppler.   Venous: The inferior vena cava measures 1.10 cm, is normal in size with  greater than 50% respiratory variability.   EKG:  EKG was ordered in the hospital on 01/19/2021 and showed normal sinus rhythm with an old right bundle branch block.  Recent Labs: 01/13/2021: BUN 24; Creatinine, Ser 1.33; Hemoglobin 10.2; Platelets 371; Potassium 4.0; Sodium 136  Recent Lipid Panel    Component Value Date/Time   CHOL 219 (H) 07/27/2018 1239   TRIG 269 (H) 07/27/2018 1239   HDL 32 (L) 07/27/2018 1239   CHOLHDL 6.8 (H) 07/27/2018 1239   CHOLHDL 11.4 08/15/2008 0408   VLDL 41 (H) 08/15/2008 0408   LDLCALC 133 (H) 07/27/2018 1239    Home Medications   Current Meds  Medication Sig   acetaminophen (TYLENOL) 500 MG tablet Take 500 mg by mouth daily as needed for moderate pain or headache.    albuterol (PROVENTIL) (2.5 MG/3ML) 0.083% nebulizer solution Take 3 mLs (2.5 mg total) by nebulization every 6 (six) hours as needed for wheezing or shortness of breath. Dx Code D86.0   Albuterol Sulfate (PROAIR RESPICLICK) 671 (90 BASE) MCG/ACT AEPB Inhale 2 puffs into the lungs every 6 (six) hours as needed.   aspirin EC 81 MG tablet Take 81 mg by mouth every evening.    Cholecalciferol (VITAMIN D3) 3000 UNITS TABS Take 3,000 Units by mouth once a week.   clopidogrel (PLAVIX) 75 MG tablet Take 1 tablet (75 mg total) by mouth daily.   Coenzyme Q10 (COQ-10) 400 MG CAPS Take 400 mg by mouth 3 (three) times a week.   DROPLET INSULIN SYRINGE 31G X 5/16" 1 ML MISC    furosemide (LASIX) 80 MG  tablet Take 1 tablet (80 mg total) by mouth daily.   glipiZIDE (GLUCOTROL) 10 MG tablet Take 10 mg by mouth every evening.   insulin aspart protamine- aspart (NOVOLOG MIX 70/30) (70-30) 100  UNIT/ML injection Inject 0.3 mLs (30 Units total) into the skin 2 (two) times daily with a meal.   levothyroxine (SYNTHROID, LEVOTHROID) 50 MCG tablet Take 50 mcg by mouth daily.    lidocaine (LIDODERM) 5 % Place 1 patch onto the skin daily as needed for pain.   losartan (COZAAR) 100 MG tablet Take 100 mg by mouth daily.   montelukast (SINGULAIR) 10 MG tablet Take 10 mg by mouth daily as needed (allergies).    Multiple Vitamin (MULTIVITAMIN WITH MINERALS) TABS tablet Take 1 tablet by mouth daily.   nitroGLYCERIN (NITROSTAT) 0.4 MG SL tablet Place 1 tablet (0.4 mg total) under the tongue every 5 (five) minutes as needed for chest pain.   OVER THE COUNTER MEDICATION Take 1 tablet by mouth daily. [place] XL   predniSONE (DELTASONE) 10 MG tablet Take 1 tablet (10 mg total) by mouth daily with breakfast. T   RABEprazole (ACIPHEX) 20 MG tablet Take 20 mg by mouth daily.   rosuvastatin (CRESTOR) 10 MG tablet TAKE 1 TABLET AT BEDTIME       Physical Exam    VS:  BP (!) 118/50   Pulse 78   Ht 5\' 10"  (1.778 m)   Wt 227 lb 6.4 oz (103.1 kg)   SpO2 97%   BMI 32.63 kg/m  , BMI Body mass index is 32.63 kg/m.  Wt Readings from Last 3 Encounters:  01/26/21 227 lb 6.4 oz (103.1 kg)  01/18/21 224 lb (101.6 kg)  12/25/20 225 lb (102.1 kg)     GEN: Well nourished, well developed, in no acute distress. HEENT: normal. Neck: Supple, no JVD, carotid bruits, or masses. Cardiac: RRR, no murmurs, rubs, or gallops. No clubbing, cyanosis, edema.  Radials/PT 2+ and equal bilaterally.  Respiratory:  Respirations regular and unlabored, clear to auscultation bilaterally. GI: Soft, nontender, nondistended. MS: No deformity or atrophy. Skin: Warm and dry, no rash. Neuro:  Strength and sensation are intact. Psych: Normal affect.  Assessment & Plan    CAD status post successful two-vessel PCI with DES x2 to RCA along with DES x1 to diagonal branch-No recurrent chest pain. Continue with DAPT, ASA/Plavix for at  least 6 months but likely longer if the patient tolerates. GDMT: statin, asa/plavix. Is not taking a BB or ACEI.   Palpitations and positional dizziness-this occurred after his last stent placement but he has never worn a monitor. If this continues he will need to call our office. BP has been well controlled at home. HR is 78 today, NSR.   Hypertension-well-controlled on current medical regiment.  Continue Norvasc and Cozaar.  Continue to check blood pressure daily at home.  Blood pressures at home have been running 725D-664Q systolic. He has not had any episodes of hypotension.   Hyperlipidemia-continue Crestor.  Last lipid panel from May 2020 revealed LDL of 133, total cholesterol 219.  Would recommend follow-up lipid panel as well as LFTs.  History of GI bleed-Last year when he was evaluated for stents, found to be anemic, two bleeding polyps and one was taken out. Two iron infusions. No bloody stools or urine. Last hemoglobin was 11.8.  Chronic diastolic heart failure-echocardiogram ordered 12/24/2020 but he was told he would not need.  Continue current medication regimen. No fluid overloaded on exam and no SOB.  PAD-continue current medical treatment  Obesity-reviewed low-sodium, heart healthy diet.  Continue daily exercise with a goal of 30 minutes x 5 days a week. He is somewhat limited due to his foot and wearing a boot for over a year.   Disposition: Follow up in 3 month(s) with Candee Furbish, MD or APP.  Signed, Elgie Collard, PA-C 01/26/2021, 10:08 AM [place]

## 2021-01-26 ENCOUNTER — Ambulatory Visit: Payer: Medicare HMO | Admitting: Physician Assistant

## 2021-01-26 ENCOUNTER — Encounter: Payer: Self-pay | Admitting: Physician Assistant

## 2021-01-26 ENCOUNTER — Other Ambulatory Visit: Payer: Self-pay

## 2021-01-26 VITALS — BP 118/50 | HR 78 | Ht 70.0 in | Wt 227.4 lb

## 2021-01-26 DIAGNOSIS — K264 Chronic or unspecified duodenal ulcer with hemorrhage: Secondary | ICD-10-CM

## 2021-01-26 DIAGNOSIS — E785 Hyperlipidemia, unspecified: Secondary | ICD-10-CM | POA: Diagnosis not present

## 2021-01-26 DIAGNOSIS — I5032 Chronic diastolic (congestive) heart failure: Secondary | ICD-10-CM

## 2021-01-26 DIAGNOSIS — I739 Peripheral vascular disease, unspecified: Secondary | ICD-10-CM | POA: Diagnosis not present

## 2021-01-26 DIAGNOSIS — I10 Essential (primary) hypertension: Secondary | ICD-10-CM

## 2021-01-26 DIAGNOSIS — I2510 Atherosclerotic heart disease of native coronary artery without angina pectoris: Secondary | ICD-10-CM | POA: Diagnosis not present

## 2021-01-26 LAB — HEPATIC FUNCTION PANEL
ALT: 16 IU/L (ref 0–44)
AST: 16 IU/L (ref 0–40)
Albumin: 3.9 g/dL (ref 3.7–4.7)
Alkaline Phosphatase: 66 IU/L (ref 44–121)
Bilirubin Total: 0.3 mg/dL (ref 0.0–1.2)
Bilirubin, Direct: 0.13 mg/dL (ref 0.00–0.40)
Total Protein: 6.5 g/dL (ref 6.0–8.5)

## 2021-01-26 LAB — LIPID PANEL
Chol/HDL Ratio: 4.8 ratio (ref 0.0–5.0)
Cholesterol, Total: 134 mg/dL (ref 100–199)
HDL: 28 mg/dL — ABNORMAL LOW (ref >39)
LDL Chol Calc (NIH): 76 mg/dL (ref 0–99)
Triglycerides: 173 mg/dL — ABNORMAL HIGH (ref 0–149)
VLDL Cholesterol Cal: 30 mg/dL (ref 5–40)

## 2021-01-26 NOTE — Patient Instructions (Addendum)
Medication Instructions:   Your physician recommends that you continue on your current medications as directed. Please refer to the Current Medication list given to you today.  *If you need a refill on your cardiac medications before your next appointment, please call your pharmacy*   Lab Work:  TODAY!!!!!  LIPID/LFT   If you have labs (blood work) drawn today and your tests are completely normal, you will receive your results only by: [place] (if you have MyChart) OR A paper copy in the mail If you have any lab test that is abnormal or we need to change your treatment, we will call you to review the results.   Testing/Procedures:  Think about if you want to wear a monitor if your palpitations persist with dizziness.      Follow-Up: At [facility], you and your health needs are our priority.  As part of our continuing mission to provide you with exceptional heart care, we have created designated Provider Care Teams.  These Care Teams include your primary Cardiologist (physician) and Advanced Practice Providers (APPs -  Physician Assistants and Nurse Practitioners) who all work together to provide you with the care you need, when you need it.  We recommend signing up for the patient portal called "MyChart".  Sign up information is provided on this After Visit Summary.  MyChart is used to connect with patients for Virtual Visits (Telemedicine).  Patients are able to view lab/test results, encounter notes, upcoming appointments, etc.  Non-urgent messages can be sent to your provider as well.   To learn more about what you can do with MyChart, go to [email or web address].    Your next appointment:   3 month(s)  The format for your next appointment:   In Person  Provider:   Candee Furbish, MD     Other Instructions  Heart-Healthy Eating Plan Many factors influence your heart (coronary) health, including eating and exercise habits. Coronary risk increases with abnormal  blood fat (lipid) levels. Heart-healthy meal planning includes limiting unhealthy fats, increasing healthy fats, and making other diet and lifestyle changes. What is my plan? Your health care provider may recommend that you: Limit your fat intake to _________% or less of your total calories each day. Limit your saturated fat intake to _________% or less of your total calories each day. Limit the amount of cholesterol in your diet to less than _________ mg per day. What are tips for following this plan? Cooking Cook foods using methods other than frying. Baking, boiling, grilling, and broiling are all good options. Other ways to reduce fat include: Removing the skin from poultry. Removing all visible fats from meats. Steaming vegetables in water or broth. Meal planning  At meals, imagine dividing your plate into fourths: Fill one-half of your plate with vegetables and green salads. Fill one-fourth of your plate with whole grains. Fill one-fourth of your plate with lean protein foods. Eat 4-5 servings of vegetables per day. One serving equals 1 cup raw or cooked vegetable, or 2 cups raw leafy greens. Eat 4-5 servings of fruit per day. One serving equals 1 medium whole fruit,  cup dried fruit,  cup fresh, frozen, or canned fruit, or  cup 100% fruit juice. Eat more foods that contain soluble fiber. Examples include apples, broccoli, carrots, beans, peas, and barley. Aim to get 25-30 g of fiber per day. Increase your consumption of legumes, nuts, and seeds to 4-5 servings per week. One serving of dried beans or legumes equals  cup cooked,  1 serving of nuts is  cup, and 1 serving of seeds equals 1 tablespoon. Fats Choose healthy fats more often. Choose monounsaturated and polyunsaturated fats, such as olive and canola oils, flaxseeds, walnuts, almonds, and seeds. Eat more omega-3 fats. Choose salmon, mackerel, sardines, tuna, flaxseed oil, and ground flaxseeds. Aim to eat fish at least 2 times  each week. Check food labels carefully to identify foods with trans fats or high amounts of saturated fat. Limit saturated fats. These are found in animal products, such as meats, butter, and cream. Plant sources of saturated fats include palm oil, palm kernel oil, and coconut oil. Avoid foods with partially hydrogenated oils in them. These contain trans fats. Examples are stick margarine, some tub margarines, cookies, crackers, and other baked goods. Avoid fried foods. General information Eat more home-cooked food and less restaurant, buffet, and fast food. Limit or avoid alcohol. Limit foods that are high in starch and sugar. Lose weight if you are overweight. Losing just 5-10% of your body weight can help your overall health and prevent diseases such as diabetes and heart disease. Monitor your salt (sodium) intake, especially if you have high blood pressure. Talk with your health care provider about your sodium intake. Try to incorporate more vegetarian meals weekly. What foods can I eat? Fruits All fresh, canned (in natural juice), or frozen fruits. Vegetables Fresh or frozen vegetables (raw, steamed, roasted, or grilled). Green salads. Grains Most grains. Choose whole wheat and whole grains most of the time. Rice and pasta, including brown rice and pastas made with whole wheat. Meats and other proteins Lean, well-trimmed beef, veal, pork, and lamb. Chicken and [place] without skin. All fish and shellfish. Wild duck, rabbit, pheasant, and venison. Egg whites or low-cholesterol egg substitutes. Dried beans, peas, lentils, and tofu. Seeds and most nuts. Dairy Low-fat or nonfat cheeses, including ricotta and mozzarella. Skim or 1% milk (liquid, powdered, or evaporated). Buttermilk made with low-fat milk. Nonfat or low-fat yogurt. Fats and oils Non-hydrogenated (trans-free) margarines. Vegetable oils, including soybean, sesame, sunflower, olive, peanut, safflower, corn, canola, and cottonseed.  Salad dressings or mayonnaise made with a vegetable oil. Beverages Water (mineral or sparkling). Coffee and tea. Diet carbonated beverages. Sweets and desserts Sherbet, gelatin, and fruit ice. Small amounts of dark chocolate. Limit all sweets and desserts. Seasonings and condiments All seasonings and condiments. The items listed above may not be a complete list of foods and beverages you can eat. Contact a dietitian for more options. What foods are not recommended? Fruits Canned fruit in heavy syrup. Fruit in cream or butter sauce. Fried fruit. Limit coconut. Vegetables Vegetables cooked in cheese, cream, or butter sauce. Fried vegetables. Grains Breads made with saturated or trans fats, oils, or whole milk. Croissants. Sweet rolls. Donuts. High-fat crackers, such as cheese crackers. Meats and other proteins Fatty meats, such as hot dogs, ribs, sausage, bacon, rib-eye roast or steak. High-fat deli meats, such as salami and bologna. Caviar. Domestic duck and goose. Organ meats, such as liver. Dairy Cream, sour cream, cream cheese, and creamed cottage cheese. Whole-milk cheeses. Whole or 2% milk (liquid, evaporated, or condensed). Whole buttermilk. Cream sauce or high-fat cheese sauce. Whole-milk yogurt. Fats and oils Meat fat, or shortening. Cocoa butter, hydrogenated oils, palm oil, coconut oil, palm kernel oil. Solid fats and shortenings, including bacon fat, salt pork, lard, and butter. Nondairy cream substitutes. Salad dressings with cheese or sour cream. Beverages Regular sodas and any drinks with added sugar. Sweets and desserts Frosting. Pudding. Cookies. Cakes.  Pies. Milk chocolate or white chocolate. Buttered syrups. Full-fat ice cream or ice cream drinks. The items listed above may not be a complete list of foods and beverages to avoid. Contact a dietitian for more information. Summary Heart-healthy meal planning includes limiting unhealthy fats, increasing healthy fats, and  making other diet and lifestyle changes. Lose weight if you are overweight. Losing just 5-10% of your body weight can help your overall health and prevent diseases such as diabetes and heart disease. Focus on eating a balance of foods, including fruits and vegetables, low-fat or nonfat dairy, lean protein, nuts and legumes, whole grains, and heart-healthy oils and fats. This information is not intended to replace advice given to you by your health care provider. Make sure you discuss any questions you have with your health care provider. Document Revised: 07/02/2020 Document Reviewed: 07/02/2020 [employer] Patient Education  [place] daily call if wt is 3lbs or more in 1 day 2 [phone].

## 2021-01-27 ENCOUNTER — Other Ambulatory Visit: Payer: Self-pay | Admitting: *Deleted

## 2021-01-27 MED ORDER — ROSUVASTATIN CALCIUM 20 MG PO TABS: 20.0000 mg | ORAL_TABLET | Freq: Every day | ORAL | 3 refills | Status: DC

## 2021-02-15 DIAGNOSIS — X32XXXD Exposure to sunlight, subsequent encounter: Secondary | ICD-10-CM | POA: Diagnosis not present

## 2021-02-15 DIAGNOSIS — L570 Actinic keratosis: Secondary | ICD-10-CM | POA: Diagnosis not present

## 2021-02-23 DIAGNOSIS — I10 Essential (primary) hypertension: Secondary | ICD-10-CM | POA: Diagnosis not present

## 2021-02-23 DIAGNOSIS — E11319 Type 2 diabetes mellitus with unspecified diabetic retinopathy without macular edema: Secondary | ICD-10-CM | POA: Diagnosis not present

## 2021-02-23 DIAGNOSIS — I739 Peripheral vascular disease, unspecified: Secondary | ICD-10-CM | POA: Diagnosis not present

## 2021-02-23 DIAGNOSIS — I5032 Chronic diastolic (congestive) heart failure: Secondary | ICD-10-CM | POA: Diagnosis not present

## 2021-02-23 DIAGNOSIS — E1142 Type 2 diabetes mellitus with diabetic polyneuropathy: Secondary | ICD-10-CM | POA: Diagnosis not present

## 2021-02-23 DIAGNOSIS — I700 Atherosclerosis of aorta: Secondary | ICD-10-CM | POA: Diagnosis not present

## 2021-02-23 DIAGNOSIS — E039 Hypothyroidism, unspecified: Secondary | ICD-10-CM | POA: Diagnosis not present

## 2021-02-23 DIAGNOSIS — D500 Iron deficiency anemia secondary to blood loss (chronic): Secondary | ICD-10-CM | POA: Diagnosis not present

## 2021-02-23 DIAGNOSIS — E7800 Pure hypercholesterolemia, unspecified: Secondary | ICD-10-CM | POA: Diagnosis not present

## 2021-02-23 DIAGNOSIS — N183 Chronic kidney disease, stage 3 unspecified: Secondary | ICD-10-CM | POA: Diagnosis not present

## 2021-02-24 DIAGNOSIS — Z8631 Personal history of diabetic foot ulcer: Secondary | ICD-10-CM | POA: Diagnosis not present

## 2021-02-24 DIAGNOSIS — E11610 Type 2 diabetes mellitus with diabetic neuropathic arthropathy: Secondary | ICD-10-CM | POA: Diagnosis not present

## 2021-04-16 DIAGNOSIS — E11319 Type 2 diabetes mellitus with unspecified diabetic retinopathy without macular edema: Secondary | ICD-10-CM | POA: Diagnosis not present

## 2021-04-16 DIAGNOSIS — I700 Atherosclerosis of aorta: Secondary | ICD-10-CM | POA: Diagnosis not present

## 2021-04-16 DIAGNOSIS — E7800 Pure hypercholesterolemia, unspecified: Secondary | ICD-10-CM | POA: Diagnosis not present

## 2021-04-16 DIAGNOSIS — I5032 Chronic diastolic (congestive) heart failure: Secondary | ICD-10-CM | POA: Diagnosis not present

## 2021-04-16 DIAGNOSIS — I10 Essential (primary) hypertension: Secondary | ICD-10-CM | POA: Diagnosis not present

## 2021-04-16 DIAGNOSIS — I739 Peripheral vascular disease, unspecified: Secondary | ICD-10-CM | POA: Diagnosis not present

## 2021-04-16 DIAGNOSIS — R42 Dizziness and giddiness: Secondary | ICD-10-CM | POA: Diagnosis not present

## 2021-04-16 DIAGNOSIS — D500 Iron deficiency anemia secondary to blood loss (chronic): Secondary | ICD-10-CM | POA: Diagnosis not present

## 2021-04-16 DIAGNOSIS — E1142 Type 2 diabetes mellitus with diabetic polyneuropathy: Secondary | ICD-10-CM | POA: Diagnosis not present

## 2021-04-16 DIAGNOSIS — E039 Hypothyroidism, unspecified: Secondary | ICD-10-CM | POA: Diagnosis not present

## 2021-04-27 ENCOUNTER — Ambulatory Visit: Payer: Medicare HMO | Admitting: Cardiology

## 2021-04-27 ENCOUNTER — Other Ambulatory Visit: Payer: Self-pay

## 2021-04-27 ENCOUNTER — Encounter: Payer: Self-pay | Admitting: Cardiology

## 2021-04-27 VITALS — BP 105/52 | HR 67 | Ht 70.0 in | Wt 217.0 lb

## 2021-04-27 DIAGNOSIS — D860 Sarcoidosis of lung: Secondary | ICD-10-CM | POA: Diagnosis not present

## 2021-04-27 DIAGNOSIS — D649 Anemia, unspecified: Secondary | ICD-10-CM

## 2021-04-27 DIAGNOSIS — I5032 Chronic diastolic (congestive) heart failure: Secondary | ICD-10-CM

## 2021-04-27 DIAGNOSIS — I2510 Atherosclerotic heart disease of native coronary artery without angina pectoris: Secondary | ICD-10-CM

## 2021-04-27 NOTE — Progress Notes (Signed)
Cardiology Office Note:    Date:  04/27/2021   ID:  Jager, Koska [birth year], MRN [id number]  PCP:  Shirline Frees, MD   [facility] Providers Cardiologist:  Candee Furbish, MD     Referring MD: Shirline Frees, MD    History of Present Illness:    [name] is a 76 y.o. male here for follow-up. He was last seen for evaluation of more shortness of breath and fatigue since August when he had COVID.  His symptoms worsened.  He stated that the last time he felt like this was when he had to have percutaneous intervention/stent.  He has been treated for GI bleed and has received iron infusion.  Since his hemoglobin has been corrected his shortness of breath and fatigue is only worsened.  He had an upper GI bleed, clipping.  Dr. Carlean Purl.  01/03/2020.  From last visit: CAD status post DES to the LAD and PLA 10/2009, DES x2 to the [place] 08/2018, echo 07/2018 LVEF 60 to 65% with grade 1 DD, diastolic CHF, pulmonary sarcoidosis, history of GI bleed, adrenal insufficiency on chronic steroids, hypertension, HLD, PAD with left tibial disease treated medically, OSA, CKD.   Patient was seen by [name], PA-C 10/11/2019 having symptoms of dyspnea on exertion and chest pain but was also Hemoccult positive and having a GI work-up.  He decrease losartan to 50 mg daily and started isosorbide 30 mg daily.  Patient was admitted with a GI bleed 12/2019 requiring transfusion.  Endoscopy showed small amount of fresh blood in the duodenal bulb but no bleeding site and 2 small polyps were removed.  Colonoscopy no active bleeding.  They resumed his Plavix and aspirin.   Has since had 2 surgeries on his left foot at [facility] in December for charcot's and osteomyelitis. HR was up and down during the second foot surgery and was in ICU for a couple days. Didn't change his meds. Endorsed right knee problems.   Hgb dropped from 12.9 to 9.3 on report he got yesterday. Iron is low and may need Iron infusion. He didn't tolerate  Imdur-says it made his chest hurt worse. Was at beach last week and ate out. Swollen ankles/legs since. A1C over 10. On chronic steroids for his lungs. Using walker and knee brace.   He has had his left foot operated on at [facility], limb preservation.  He was told previously here that he would need to lose his left leg below the knee.  After his COVID infection in 10/2020, he reported significant shortness of breath. Excessive fatigue.  Hemoglobin was stable 12.3 he stated.  No active GI bleeding.  He saw Nicholes Rough, PA-C 01/26/21. Since his stent placement 01/18/21, his breathing had improved but he complained of positional dizziness and episodes of heart racing. He stated he had never worn a monitor. He was interested in attending cardiac rehab.   Today, he is accompanied by his wife. Since his stent placement, his breathing improved but his chest pain worsened. He can feel the pain when he bends in a certain direction. Yesterday, he had an episode of chest pain lasting 30 minutes. He describes the pain as a sharp poking sensation. He believes that he has another blockage.  Although his breathing improved, he still becomes winded after going up stair or coming back from his mailbox.   Occasionally, he has episodes of dizziness. The dizziness episodes occur primarily with rapid movements or positional changes.   He is wearing a  brace on his L foot due to various surgeries. He has custom shoes arriving tomorrow. The foot pain limits his physical activity.  Last November, he was found to have bleeding polyps. However, only 1 polyp was removed. He believes the remaining polyp is leaking again.   He helps take care of his 72-month old grandchild which keeps him active.   Of note, his echocardiogram appointment was taken off after his stent placement.   He denies any palpitations, headaches, syncope, orthopnea, PND, or lower extremity edema.  Past Medical History:  Diagnosis Date   Adrenal  insufficiency ([place])    Allergy    Anemia    Arthritis    feet    Broken foot 09/2019   had to wore a boot. Left    CKD (chronic kidney disease), stage III (HCC)    Coronary artery disease    has stents   Coronary atherosclerosis of native coronary artery    Proximal LAD, posterior lateral stent widely patent-10/12/11   Diabetes mellitus    insulin and pills   Hearing loss    wears hearing aids   Heart attack ([place]) 2010   History of blood transfusion 06/30/2016   [facility] - 2 units transfused   Hypertension    OSA (obstructive sleep apnea)    uses VPAC sleep study 2 years done through [place]. Dr. Marlou Porch arranged study   Pneumonia    3-4 years ago   Sarcoid    Sarcoidosis    Sleep apnea    uses CPAP nightly   Thyroid disease    Type 2 diabetes mellitus ([place])     Past Surgical History:  Procedure Laterality Date   APPENDECTOMY     CATARACT EXTRACTION  2011   bilat   CHOLECYSTECTOMY  01/25/2011   Procedure: LAPAROSCOPIC CHOLECYSTECTOMY WITH INTRAOPERATIVE CHOLANGIOGRAM;  Surgeon: Judieth Keens, DO;  Location: [facility];  Service: General;  Laterality: N/A;   COLONOSCOPY     several   COLONOSCOPY WITH PROPOFOL N/A 01/03/2020   Procedure: COLONOSCOPY WITH PROPOFOL;  Surgeon: Gatha Mayer, MD;  Location: [facility];  Service: Endoscopy;  Laterality: N/A;   CORONARY ANGIOPLASTY     most recent 11/2009   CORONARY STENT INTERVENTION N/A 08/07/2018   Procedure: CORONARY STENT INTERVENTION;  Surgeon: Jettie Booze, MD;  Location: [place] [facility];  Service: Cardiovascular;  Laterality: N/A;   CORONARY STENT INTERVENTION N/A 01/18/2021   Procedure: CORONARY STENT INTERVENTION;  Surgeon: Sherren Mocha, MD;  Location: [place] [facility];  Service: Cardiovascular;  Laterality: N/A;   CORONARY STENT PLACEMENT  2009   in LAD and side branch PTCA   ESOPHAGOGASTRODUODENOSCOPY (EGD) WITH PROPOFOL N/A 01/03/2020   Procedure: ESOPHAGOGASTRODUODENOSCOPY (EGD) WITH PROPOFOL;   Surgeon: Gatha Mayer, MD;  [facility]: [facility];  Service: Endoscopy;  Laterality: N/A;   HEMOSTASIS CLIP PLACEMENT  01/03/2020   Procedure: HEMOSTASIS CLIP PLACEMENT;  Surgeon: Gatha Mayer, MD;  Location: [facility];  Service: Endoscopy;;   INTERCOSTAL NERVE BLOCK  2011, 06/2016   x2. lumbar spine   INTRAVASCULAR PRESSURE WIRE/FFR STUDY N/A 01/18/2021   Procedure: INTRAVASCULAR PRESSURE WIRE/FFR STUDY;  Surgeon: Sherren Mocha, MD;  Location: [place] [facility];  Service: Cardiovascular;  Laterality: N/A;   LEFT HEART CATHETERIZATION WITH CORONARY ANGIOGRAM Bilateral 10/12/2011   Procedure: LEFT HEART CATHETERIZATION WITH CORONARY ANGIOGRAM;  Surgeon: Candee Furbish, MD;  Location: [facility] CATH LAB;  Service: Cardiovascular;  Laterality: Bilateral;   LEFT HEART CATHETERIZATION WITH CORONARY  ANGIOGRAM N/A 04/01/2014   Procedure: LEFT HEART CATHETERIZATION WITH CORONARY ANGIOGRAM;  Surgeon: Candee Furbish, MD;  Location: [facility] CATH LAB;  Service: Cardiovascular;  Laterality: N/A;   POLYPECTOMY  01/03/2020   Procedure: POLYPECTOMY;  Surgeon: Gatha Mayer, MD;  [facility];  Service: Endoscopy;;   RIGHT/LEFT HEART CATH AND CORONARY ANGIOGRAPHY N/A 08/07/2018   Procedure: RIGHT/LEFT HEART CATH AND CORONARY ANGIOGRAPHY;  Surgeon: Jettie Booze, MD;  Location: [place] CV [facility];  Service: Cardiovascular;  Laterality: N/A;   RIGHT/LEFT HEART CATH AND CORONARY ANGIOGRAPHY N/A 12/25/2020   Procedure: RIGHT/LEFT HEART CATH AND CORONARY ANGIOGRAPHY;  Surgeon: Belva Crome, MD;  Location: [place] CV [facility];  Service: Cardiovascular;  Laterality: N/A;    Current Medications: Current Meds  Medication Sig   acetaminophen (TYLENOL) 500 MG tablet Take 500 mg by mouth daily as needed for moderate pain or headache.    albuterol (PROVENTIL) (2.5 MG/3ML) 0.083% nebulizer solution Take 3 mLs (2.5 mg total) by nebulization every 6 (six) hours as needed for wheezing or shortness of breath. Dx Code  D86.0   Albuterol Sulfate (PROAIR RESPICLICK) 716 (90 BASE) MCG/ACT AEPB Inhale 2 puffs into the lungs every 6 (six) hours as needed.   aspirin EC 81 MG tablet Take 81 mg by mouth every evening.    Cholecalciferol (VITAMIN D3) 3000 UNITS TABS Take 3,000 Units by mouth once a week.   clopidogrel (PLAVIX) 75 MG tablet Take 1 tablet (75 mg total) by mouth daily.   Coenzyme Q10 (COQ-10) 400 MG CAPS Take 400 mg by mouth 3 (three) times a week.   DROPLET INSULIN SYRINGE 31G X 5/16" 1 ML MISC    furosemide (LASIX) 80 MG tablet Take 1 tablet (80 mg total) by mouth daily.   glipiZIDE (GLUCOTROL) 10 MG tablet Take 10 mg by mouth every evening.   insulin aspart protamine- aspart (NOVOLOG MIX 70/30) (70-30) 100 UNIT/ML injection Inject 0.3 mLs (30 Units total) into the skin 2 (two) times daily with a meal.   levothyroxine (SYNTHROID) 75 MCG tablet Take 75 mcg by mouth daily.   lidocaine (LIDODERM) 5 % Place 1 patch onto the skin daily as needed for pain.   losartan (COZAAR) 100 MG tablet Take 100 mg by mouth daily.   montelukast (SINGULAIR) 10 MG tablet Take 10 mg by mouth daily as needed (allergies).    Multiple Vitamin (MULTIVITAMIN WITH MINERALS) TABS tablet Take 1 tablet by mouth daily.   nitroGLYCERIN (NITROSTAT) 0.4 MG SL tablet Place 1 tablet (0.4 mg total) under the tongue every 5 (five) minutes as needed for chest pain.   OVER THE COUNTER MEDICATION Take 1 tablet by mouth daily. Omaga XL   predniSONE (DELTASONE) 10 MG tablet Take 1 tablet (10 mg total) by mouth daily with breakfast. T   RABEprazole (ACIPHEX) 20 MG tablet Take 20 mg by mouth daily.   rosuvastatin (CRESTOR) 20 MG tablet Take 1 tablet (20 mg total) by mouth at bedtime.   RYBELSUS 3 MG TABS Take 1 tablet by mouth every morning.     Allergies:   Statins, Hydrocortisone, Azithromycin, Miralax [polyethylene glycol], and Metolazone   Social History   Socioeconomic History   Marital status: Married    Spouse name: Not on file    Number of children: 3   Years of education: Not on file   Highest education level: Not on file  Occupational History   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   Occupation: MANAGER  Employer: [employer]  Tobacco Use   Smoking status: Never   Smokeless tobacco: Never  Vaping Use   Vaping Use: Never used  Substance and Sexual Activity   Alcohol use: No   Drug use: No   Sexual activity: Not on file  Other Topics Concern   Not on file  Social History Narrative   Not on file   Social Determinants of Health   Financial Resource Strain: Not on file  Food Insecurity: Not on file  Transportation Needs: Not on file  Physical Activity: Not on file  Stress: Not on file  Social Connections: Not on file     Family History: The patient's family history includes Anesthesia problems in his sister; Asthma in his sister; Diabetes in his mother; Heart attack in his father; Kidney cancer in his mother; Kidney disease in his mother; Sarcoidosis in his niece and sister. There is no history of Colon cancer, Colon polyps, Rectal cancer, or Stomach cancer.  ROS:   Please see the history of present illness.   (+) Chest pain  (+) Dyspnea on exertion (+) L foot pain (+) Dizziness   All other systems reviewed and are negative.  EKGs/Labs/Other Studies Reviewed:    The following studies were reviewed today: R/L Cath 12/25/20 CONCLUSIONS: Diffuse three-vessel coronary disease is noted on diagram with moderate LAD beyond the stented segment, moderately severe diagonal, moderate less than or equal to 70% ramus, new 75 to 80% proximal and 60% mid to distal RCA.  Left main is unremarkable. Physiology assessment across the tandem right coronary lesions did not demonstrate ischemia (RFR 0.91). Low normal to mildly depressed LV function, EF 45 to 50%.  EDP 17 mmHg. Mild pulmonary hypertension with mean pressure 71 mmHg.  Wedge pressure 10 mmHg.   RECOMMENDATIONS: Given chronic kidney  disease, multiple lesions, and no specific target that is clearly a culprit, my approach was more conservative. Recommend consideration of SGLT2 therapy for component of chronic midrange combined systolic and diastolic heart failure. RCA could be treated although no physiologic support noted today. Diagonal is largely unchanged.  R/L cardiac catheterization 08/07/2018 LAD proximal stent patent with 40 ISR LCx mid 50 RCA proximal 25; RPDA ostial 80, 70; RP AV stent patent Mean PA 18, mean PCWP 12 PCI: 2.25 x 8 mm Resolute Onyx DES and 2 x 3 mm Resolute Onyx DES to the RPDA   Echocardiogram 07/27/2018 EF 60-65, mild LVH, GR 1 DD, normal RVSF  ECHO 07/2018:  1. The left ventricle has normal systolic function with an ejection  fraction of 60-65%. The cavity size was normal. There is mildly increased  left ventricular wall thickness. Left ventricular diastolic Doppler  parameters are consistent with impaired  relaxation.   2. The right ventricle has normal systolic function. The cavity was  normal. There is no increase in right ventricular wall thickness.   3. The aortic valve is tricuspid. Mild thickening of the aortic valve.  Mild calcification of the aortic valve.   Cath 08/07/2018: Diagnostic Dominance: Right Intervention    Chest CT 2021: Chronic changes consistent with the given clinical history of sarcoidosis  EKG:  EKG was not ordered today 12/24/20: sinus rhythm 74 left axis deviation.  No significant change from prior EKG personally reviewed.  Recent Labs: 01/13/2021: BUN 24; Creatinine, Ser 1.33; Hemoglobin 10.2; Platelets 371; Potassium 4.0; Sodium 136 01/26/2021: ALT 16  Recent Lipid Panel    Component Value Date/Time   CHOL 134 01/26/2021 1012   TRIG  173 (H) 01/26/2021 1012   HDL 28 (L) 01/26/2021 1012   CHOLHDL 4.8 01/26/2021 1012   CHOLHDL 11.4 08/15/2008 0408   VLDL 41 (H) 08/15/2008 0408   LDLCALC 76 01/26/2021 1012     Risk Assessment/Calculations:           Physical Exam:    VS:  BP (!) 105/52 (BP Location: Left Arm, Patient Position: Sitting, Cuff Size: Normal)    Pulse 67    Ht 5\' 10"  (1.778 m)    Wt 217 lb (98.4 kg)    SpO2 94%    BMI 31.14 kg/m     Wt Readings from Last 3 Encounters:  04/27/21 217 lb (98.4 kg)  01/26/21 227 lb 6.4 oz (103.1 kg)  01/18/21 224 lb (101.6 kg)     GEN:  Well nourished, well developed in no acute distress HEENT: Normal NECK: No JVD; No carotid bruits LYMPHATICS: No lymphadenopathy CARDIAC: RRR, no murmurs, rubs, gallops RESPIRATORY:  Clear to auscultation without rales, wheezing or rhonchi  ABDOMEN: Soft, non-tender, non-distended MUSCULOSKELETAL:  No edema; No deformity  SKIN: Warm and dry NEUROLOGIC:  Alert and oriented x 3 PSYCHIATRIC:  Normal affect   ASSESSMENT:    1. Chronic diastolic heart failure ([place])   2. Atherosclerosis of native coronary artery of native heart, unspecified whether angina present   3. Pulmonary sarcoidosis ([place])   4. Symptomatic anemia     PLAN:    In order of problems listed above:  Coronary atherosclerosis of native coronary artery Most recent cardiac catheterization reviewed from November 2022.  3 separate stents were placed.  He did feel better.  His shortness of breath is improved.  He still sometimes has some twisting chest sharp pain that is sharp across the chest wall likely musculoskeletal at times.  Continue with exercise.  Rehabilitation efforts.  Goal-directed medical therapy.  Pulmonary sarcoidosis (HCC) On chronic low-dose prednisone  ADRENAL INSUFFICIENCY, HX OF On chronic low-dose prednisone  Symptomatic anemia GI continuing to monitor.  Hemoglobin in the 10 range most recently.  He is on both Plavix and aspirin.    Medication Adjustments/Labs and Tests Ordered: Current medicines are reviewed at length with the patient today.  Concerns regarding medicines are outlined above.  Orders Placed This Encounter  Procedures    ECHOCARDIOGRAM COMPLETE    No orders of the defined types were placed in this encounter.    Patient Instructions  Medication Instructions:  The current medical regimen is effective;  continue present plan and medications.  *If you need a refill on your cardiac medications before your next appointment, please call your pharmacy*  Testing/Procedures: Your physician has requested that you have an echocardiogram. Echocardiography is a painless test that uses sound waves to create images of your heart. It provides your doctor with information about the size and shape of your heart and how well your hearts chambers and valves are working. This procedure takes approximately one hour. There are no restrictions for this procedure.  Follow-Up: At [facility], you and your health needs are our priority.  As part of our continuing mission to provide you with exceptional heart care, we have created designated Provider Care Teams.  These Care Teams include your primary Cardiologist (physician) and Advanced Practice Providers (APPs -  Physician Assistants and Nurse Practitioners) who all work together to provide you with the care you need, when you need it.  We recommend signing up for the patient portal called "MyChart".  Sign up information is provided  on this After Visit Summary.  MyChart is used to connect with patients for Virtual Visits (Telemedicine).  Patients are able to view lab/test results, encounter notes, upcoming appointments, etc.  Non-urgent messages can be sent to your provider as well.   To learn more about what you can do with MyChart, go to [email or web address].    Your next appointment:   6 month(s)  The format for your next appointment:   In Person  Provider:   Candee Furbish, MD {    Thank you for choosing [facility]!!      Court Joy Javier,acting as a scribe for Candee Furbish, MD.,have documented all relevant documentation on the behalf of Candee Furbish,  MD,as directed by  Candee Furbish, MD while in the presence of Candee Furbish, MD.  I, Candee Furbish, MD, have reviewed all documentation for this visit. The documentation on 04/27/21 for the exam, diagnosis, procedures, and orders are all accurate and complete.   Signed, Candee Furbish, MD  04/27/2021 2:14 PM    [facility]

## 2021-04-27 NOTE — Assessment & Plan Note (Signed)
GI continuing to monitor.  Hemoglobin in the 10 range most recently.  He is on both Plavix and aspirin.

## 2021-04-27 NOTE — Patient Instructions (Signed)
Medication Instructions:  The current medical regimen is effective;  continue present plan and medications.  *If you need a refill on your cardiac medications before your next appointment, please call your pharmacy*  Testing/Procedures: Your physician has requested that you have an echocardiogram. Echocardiography is a painless test that uses sound waves to create images of your heart. It provides your doctor with information about the size and shape of your heart and how well your hearts chambers and valves are working. This procedure takes approximately one hour. There are no restrictions for this procedure.  Follow-Up: At [facility], you and your health needs are our priority.  As part of our continuing mission to provide you with exceptional heart care, we have created designated Provider Care Teams.  These Care Teams include your primary Cardiologist (physician) and Advanced Practice Providers (APPs -  Physician Assistants and Nurse Practitioners) who all work together to provide you with the care you need, when you need it.  We recommend signing up for the patient portal called "MyChart".  Sign up information is provided on this After Visit Summary.  MyChart is used to connect with patients for Virtual Visits (Telemedicine).  Patients are able to view lab/test results, encounter notes, upcoming appointments, etc.  Non-urgent messages can be sent to your provider as well.   To learn more about what you can do with MyChart, go to [email or web address].    Your next appointment:   6 month(s)  The format for your next appointment:   In Person  Provider:   Candee Furbish, MD {    Thank you for choosing [facility]!!

## 2021-04-27 NOTE — Assessment & Plan Note (Signed)
On chronic low-dose prednisone

## 2021-04-27 NOTE — Assessment & Plan Note (Signed)
Most recent cardiac catheterization reviewed from November 2022.  3 separate stents were placed.  He did feel better.  His shortness of breath is improved.  He still sometimes has some twisting chest sharp pain that is sharp across the chest wall likely musculoskeletal at times.  Continue with exercise.  Rehabilitation efforts.  Goal-directed medical therapy.

## 2021-04-28 DIAGNOSIS — Z8631 Personal history of diabetic foot ulcer: Secondary | ICD-10-CM | POA: Diagnosis not present

## 2021-04-28 DIAGNOSIS — E11610 Type 2 diabetes mellitus with diabetic neuropathic arthropathy: Secondary | ICD-10-CM | POA: Diagnosis not present

## 2021-04-28 DIAGNOSIS — M216X2 Other acquired deformities of left foot: Secondary | ICD-10-CM | POA: Diagnosis not present

## 2021-04-28 DIAGNOSIS — L84 Corns and callosities: Secondary | ICD-10-CM | POA: Diagnosis not present

## 2021-05-13 ENCOUNTER — Ambulatory Visit (HOSPITAL_COMMUNITY): Payer: Medicare HMO | Attending: Internal Medicine

## 2021-05-13 ENCOUNTER — Other Ambulatory Visit: Payer: Self-pay

## 2021-05-13 DIAGNOSIS — I5032 Chronic diastolic (congestive) heart failure: Secondary | ICD-10-CM | POA: Insufficient documentation

## 2021-05-13 LAB — ECHOCARDIOGRAM COMPLETE
Area-P 1/2: 4.52 cm2
S' Lateral: 3.7 cm

## 2021-06-23 DIAGNOSIS — E1142 Type 2 diabetes mellitus with diabetic polyneuropathy: Secondary | ICD-10-CM | POA: Diagnosis not present

## 2021-06-23 DIAGNOSIS — E782 Mixed hyperlipidemia: Secondary | ICD-10-CM | POA: Diagnosis not present

## 2021-06-24 DIAGNOSIS — N1831 Chronic kidney disease, stage 3a: Secondary | ICD-10-CM | POA: Diagnosis not present

## 2021-06-24 DIAGNOSIS — D869 Sarcoidosis, unspecified: Secondary | ICD-10-CM | POA: Diagnosis not present

## 2021-06-24 DIAGNOSIS — R0602 Shortness of breath: Secondary | ICD-10-CM | POA: Diagnosis not present

## 2021-06-24 DIAGNOSIS — I5032 Chronic diastolic (congestive) heart failure: Secondary | ICD-10-CM | POA: Diagnosis not present

## 2021-06-24 DIAGNOSIS — I25118 Atherosclerotic heart disease of native coronary artery with other forms of angina pectoris: Secondary | ICD-10-CM | POA: Diagnosis not present

## 2021-06-24 DIAGNOSIS — I10 Essential (primary) hypertension: Secondary | ICD-10-CM | POA: Diagnosis not present

## 2021-06-24 DIAGNOSIS — R001 Bradycardia, unspecified: Secondary | ICD-10-CM | POA: Diagnosis not present

## 2021-06-30 DIAGNOSIS — R001 Bradycardia, unspecified: Secondary | ICD-10-CM | POA: Diagnosis not present

## 2021-06-30 DIAGNOSIS — I25118 Atherosclerotic heart disease of native coronary artery with other forms of angina pectoris: Secondary | ICD-10-CM | POA: Diagnosis not present

## 2021-07-02 DIAGNOSIS — L570 Actinic keratosis: Secondary | ICD-10-CM | POA: Diagnosis not present

## 2021-07-02 DIAGNOSIS — D0461 Carcinoma in situ of skin of right upper limb, including shoulder: Secondary | ICD-10-CM | POA: Diagnosis not present

## 2021-07-02 DIAGNOSIS — X32XXXD Exposure to sunlight, subsequent encounter: Secondary | ICD-10-CM | POA: Diagnosis not present

## 2021-07-16 DIAGNOSIS — R001 Bradycardia, unspecified: Secondary | ICD-10-CM | POA: Diagnosis not present

## 2021-07-16 DIAGNOSIS — I25118 Atherosclerotic heart disease of native coronary artery with other forms of angina pectoris: Secondary | ICD-10-CM | POA: Diagnosis not present

## 2021-07-16 DIAGNOSIS — M7918 Myalgia, other site: Secondary | ICD-10-CM | POA: Diagnosis not present

## 2021-07-16 DIAGNOSIS — M5416 Radiculopathy, lumbar region: Secondary | ICD-10-CM | POA: Diagnosis not present

## 2021-07-29 DIAGNOSIS — M5416 Radiculopathy, lumbar region: Secondary | ICD-10-CM | POA: Diagnosis not present

## 2021-08-06 DIAGNOSIS — R001 Bradycardia, unspecified: Secondary | ICD-10-CM | POA: Diagnosis not present

## 2021-08-06 DIAGNOSIS — I5032 Chronic diastolic (congestive) heart failure: Secondary | ICD-10-CM | POA: Diagnosis not present

## 2021-08-06 DIAGNOSIS — I25118 Atherosclerotic heart disease of native coronary artery with other forms of angina pectoris: Secondary | ICD-10-CM | POA: Diagnosis not present

## 2021-08-06 DIAGNOSIS — N1831 Chronic kidney disease, stage 3a: Secondary | ICD-10-CM | POA: Diagnosis not present

## 2021-08-06 DIAGNOSIS — I493 Ventricular premature depolarization: Secondary | ICD-10-CM | POA: Diagnosis not present

## 2021-08-06 DIAGNOSIS — D869 Sarcoidosis, unspecified: Secondary | ICD-10-CM | POA: Diagnosis not present

## 2021-08-06 DIAGNOSIS — R0602 Shortness of breath: Secondary | ICD-10-CM | POA: Diagnosis not present

## 2021-08-06 DIAGNOSIS — I10 Essential (primary) hypertension: Secondary | ICD-10-CM | POA: Diagnosis not present

## 2021-08-09 DIAGNOSIS — M5416 Radiculopathy, lumbar region: Secondary | ICD-10-CM | POA: Diagnosis not present

## 2021-08-10 DIAGNOSIS — I2510 Atherosclerotic heart disease of native coronary artery without angina pectoris: Secondary | ICD-10-CM | POA: Diagnosis not present

## 2021-08-10 DIAGNOSIS — I130 Hypertensive heart and chronic kidney disease with heart failure and stage 1 through stage 4 chronic kidney disease, or unspecified chronic kidney disease: Secondary | ICD-10-CM | POA: Diagnosis not present

## 2021-08-10 DIAGNOSIS — Z955 Presence of coronary angioplasty implant and graft: Secondary | ICD-10-CM | POA: Diagnosis not present

## 2021-08-10 DIAGNOSIS — R0602 Shortness of breath: Secondary | ICD-10-CM | POA: Diagnosis not present

## 2021-08-10 DIAGNOSIS — D869 Sarcoidosis, unspecified: Secondary | ICD-10-CM | POA: Diagnosis not present

## 2021-08-10 DIAGNOSIS — D649 Anemia, unspecified: Secondary | ICD-10-CM | POA: Diagnosis not present

## 2021-08-10 DIAGNOSIS — I5032 Chronic diastolic (congestive) heart failure: Secondary | ICD-10-CM | POA: Diagnosis not present

## 2021-08-10 DIAGNOSIS — I25118 Atherosclerotic heart disease of native coronary artery with other forms of angina pectoris: Secondary | ICD-10-CM | POA: Diagnosis not present

## 2021-08-10 DIAGNOSIS — Z86010 Personal history of colonic polyps: Secondary | ICD-10-CM | POA: Diagnosis not present

## 2021-08-10 DIAGNOSIS — N1831 Chronic kidney disease, stage 3a: Secondary | ICD-10-CM | POA: Diagnosis not present

## 2021-08-17 DIAGNOSIS — Z1211 Encounter for screening for malignant neoplasm of colon: Secondary | ICD-10-CM | POA: Diagnosis not present

## 2021-08-17 DIAGNOSIS — D509 Iron deficiency anemia, unspecified: Secondary | ICD-10-CM | POA: Diagnosis not present

## 2021-08-18 DIAGNOSIS — M7061 Trochanteric bursitis, right hip: Secondary | ICD-10-CM | POA: Diagnosis not present

## 2021-08-24 ENCOUNTER — Other Ambulatory Visit: Payer: Self-pay | Admitting: Physician Assistant

## 2021-08-27 DIAGNOSIS — L570 Actinic keratosis: Secondary | ICD-10-CM | POA: Diagnosis not present

## 2021-08-27 DIAGNOSIS — C44622 Squamous cell carcinoma of skin of right upper limb, including shoulder: Secondary | ICD-10-CM | POA: Diagnosis not present

## 2021-08-27 DIAGNOSIS — X32XXXD Exposure to sunlight, subsequent encounter: Secondary | ICD-10-CM | POA: Diagnosis not present

## 2021-09-03 DIAGNOSIS — D649 Anemia, unspecified: Secondary | ICD-10-CM | POA: Diagnosis not present

## 2021-09-06 DIAGNOSIS — M25551 Pain in right hip: Secondary | ICD-10-CM | POA: Diagnosis not present

## 2021-09-12 ENCOUNTER — Encounter (HOSPITAL_COMMUNITY): Payer: Self-pay

## 2021-09-12 ENCOUNTER — Inpatient Hospital Stay (HOSPITAL_COMMUNITY)
Admission: EM | Admit: 2021-09-12 | Discharge: 2021-09-16 | DRG: 551 | Disposition: A | Discharge status: Home Health | Payer: Medicare HMO | Attending: Internal Medicine | Admitting: Internal Medicine

## 2021-09-12 ENCOUNTER — Emergency Department (HOSPITAL_COMMUNITY): Payer: Medicare HMO

## 2021-09-12 DIAGNOSIS — E1151 Type 2 diabetes mellitus with diabetic peripheral angiopathy without gangrene: Secondary | ICD-10-CM | POA: Diagnosis present

## 2021-09-12 DIAGNOSIS — M545 Low back pain, unspecified: Secondary | ICD-10-CM | POA: Diagnosis not present

## 2021-09-12 DIAGNOSIS — E663 Overweight: Secondary | ICD-10-CM | POA: Diagnosis present

## 2021-09-12 DIAGNOSIS — E860 Dehydration: Secondary | ICD-10-CM | POA: Diagnosis present

## 2021-09-12 DIAGNOSIS — J9601 Acute respiratory failure with hypoxia: Secondary | ICD-10-CM | POA: Diagnosis present

## 2021-09-12 DIAGNOSIS — M5459 Other low back pain: Secondary | ICD-10-CM | POA: Diagnosis present

## 2021-09-12 DIAGNOSIS — M5116 Intervertebral disc disorders with radiculopathy, lumbar region: Secondary | ICD-10-CM | POA: Diagnosis not present

## 2021-09-12 DIAGNOSIS — I5032 Chronic diastolic (congestive) heart failure: Secondary | ICD-10-CM | POA: Diagnosis present

## 2021-09-12 DIAGNOSIS — G9340 Encephalopathy, unspecified: Secondary | ICD-10-CM | POA: Diagnosis not present

## 2021-09-12 DIAGNOSIS — M5431 Sciatica, right side: Secondary | ICD-10-CM | POA: Diagnosis present

## 2021-09-12 DIAGNOSIS — I959 Hypotension, unspecified: Secondary | ICD-10-CM | POA: Diagnosis not present

## 2021-09-12 DIAGNOSIS — E2740 Unspecified adrenocortical insufficiency: Secondary | ICD-10-CM | POA: Diagnosis present

## 2021-09-12 DIAGNOSIS — G8929 Other chronic pain: Secondary | ICD-10-CM | POA: Diagnosis present

## 2021-09-12 DIAGNOSIS — M48061 Spinal stenosis, lumbar region without neurogenic claudication: Secondary | ICD-10-CM | POA: Diagnosis present

## 2021-09-12 DIAGNOSIS — G928 Other toxic encephalopathy: Secondary | ICD-10-CM | POA: Diagnosis not present

## 2021-09-12 DIAGNOSIS — R7989 Other specified abnormal findings of blood chemistry: Secondary | ICD-10-CM

## 2021-09-12 DIAGNOSIS — Z7902 Long term (current) use of antithrombotics/antiplatelets: Secondary | ICD-10-CM

## 2021-09-12 DIAGNOSIS — D508 Other iron deficiency anemias: Secondary | ICD-10-CM | POA: Diagnosis present

## 2021-09-12 DIAGNOSIS — N179 Acute kidney failure, unspecified: Secondary | ICD-10-CM

## 2021-09-12 DIAGNOSIS — I130 Hypertensive heart and chronic kidney disease with heart failure and stage 1 through stage 4 chronic kidney disease, or unspecified chronic kidney disease: Secondary | ICD-10-CM | POA: Diagnosis present

## 2021-09-12 DIAGNOSIS — Z888 Allergy status to other drugs, medicaments and biological substances status: Secondary | ICD-10-CM

## 2021-09-12 DIAGNOSIS — G4733 Obstructive sleep apnea (adult) (pediatric): Secondary | ICD-10-CM | POA: Diagnosis present

## 2021-09-12 DIAGNOSIS — E119 Type 2 diabetes mellitus without complications: Secondary | ICD-10-CM

## 2021-09-12 DIAGNOSIS — M4316 Spondylolisthesis, lumbar region: Secondary | ICD-10-CM | POA: Diagnosis present

## 2021-09-12 DIAGNOSIS — Z794 Long term (current) use of insulin: Secondary | ICD-10-CM

## 2021-09-12 DIAGNOSIS — D649 Anemia, unspecified: Secondary | ICD-10-CM

## 2021-09-12 DIAGNOSIS — M5416 Radiculopathy, lumbar region: Secondary | ICD-10-CM | POA: Diagnosis present

## 2021-09-12 DIAGNOSIS — I25119 Atherosclerotic heart disease of native coronary artery with unspecified angina pectoris: Secondary | ICD-10-CM | POA: Diagnosis not present

## 2021-09-12 DIAGNOSIS — D860 Sarcoidosis of lung: Secondary | ICD-10-CM | POA: Diagnosis present

## 2021-09-12 DIAGNOSIS — D509 Iron deficiency anemia, unspecified: Secondary | ICD-10-CM | POA: Diagnosis present

## 2021-09-12 DIAGNOSIS — M549 Dorsalgia, unspecified: Secondary | ICD-10-CM | POA: Diagnosis not present

## 2021-09-12 DIAGNOSIS — I25118 Atherosclerotic heart disease of native coronary artery with other forms of angina pectoris: Secondary | ICD-10-CM | POA: Diagnosis not present

## 2021-09-12 DIAGNOSIS — T380X5A Adverse effect of glucocorticoids and synthetic analogues, initial encounter: Secondary | ICD-10-CM | POA: Diagnosis present

## 2021-09-12 DIAGNOSIS — H9193 Unspecified hearing loss, bilateral: Secondary | ICD-10-CM | POA: Diagnosis present

## 2021-09-12 DIAGNOSIS — M5441 Lumbago with sciatica, right side: Secondary | ICD-10-CM | POA: Diagnosis not present

## 2021-09-12 DIAGNOSIS — E1122 Type 2 diabetes mellitus with diabetic chronic kidney disease: Secondary | ICD-10-CM | POA: Diagnosis present

## 2021-09-12 DIAGNOSIS — Z7952 Long term (current) use of systemic steroids: Secondary | ICD-10-CM

## 2021-09-12 DIAGNOSIS — Z833 Family history of diabetes mellitus: Secondary | ICD-10-CM

## 2021-09-12 DIAGNOSIS — N183 Chronic kidney disease, stage 3 unspecified: Secondary | ICD-10-CM | POA: Diagnosis not present

## 2021-09-12 DIAGNOSIS — E1165 Type 2 diabetes mellitus with hyperglycemia: Secondary | ICD-10-CM | POA: Diagnosis present

## 2021-09-12 DIAGNOSIS — Z8249 Family history of ischemic heart disease and other diseases of the circulatory system: Secondary | ICD-10-CM

## 2021-09-12 DIAGNOSIS — Z841 Family history of disorders of kidney and ureter: Secondary | ICD-10-CM

## 2021-09-12 DIAGNOSIS — E039 Hypothyroidism, unspecified: Secondary | ICD-10-CM | POA: Diagnosis present

## 2021-09-12 DIAGNOSIS — Z955 Presence of coronary angioplasty implant and graft: Secondary | ICD-10-CM

## 2021-09-12 DIAGNOSIS — M47816 Spondylosis without myelopathy or radiculopathy, lumbar region: Secondary | ICD-10-CM | POA: Diagnosis not present

## 2021-09-12 DIAGNOSIS — Z6829 Body mass index (BMI) 29.0-29.9, adult: Secondary | ICD-10-CM

## 2021-09-12 DIAGNOSIS — X501XXA Overexertion from prolonged static or awkward postures, initial encounter: Secondary | ICD-10-CM | POA: Diagnosis not present

## 2021-09-12 DIAGNOSIS — Z79890 Hormone replacement therapy (postmenopausal): Secondary | ICD-10-CM

## 2021-09-12 DIAGNOSIS — Z9842 Cataract extraction status, left eye: Secondary | ICD-10-CM

## 2021-09-12 DIAGNOSIS — D72825 Bandemia: Secondary | ICD-10-CM | POA: Diagnosis present

## 2021-09-12 DIAGNOSIS — Z825 Family history of asthma and other chronic lower respiratory diseases: Secondary | ICD-10-CM

## 2021-09-12 DIAGNOSIS — Z7982 Long term (current) use of aspirin: Secondary | ICD-10-CM

## 2021-09-12 DIAGNOSIS — Z885 Allergy status to narcotic agent status: Secondary | ICD-10-CM

## 2021-09-12 DIAGNOSIS — Z974 Presence of external hearing-aid: Secondary | ICD-10-CM

## 2021-09-12 DIAGNOSIS — I252 Old myocardial infarction: Secondary | ICD-10-CM

## 2021-09-12 DIAGNOSIS — M5489 Other dorsalgia: Secondary | ICD-10-CM | POA: Diagnosis not present

## 2021-09-12 DIAGNOSIS — Z9841 Cataract extraction status, right eye: Secondary | ICD-10-CM

## 2021-09-12 DIAGNOSIS — Y929 Unspecified place or not applicable: Secondary | ICD-10-CM | POA: Diagnosis not present

## 2021-09-12 LAB — BASIC METABOLIC PANEL
Anion gap: 9 (ref 5–15)
BUN: 18 mg/dL (ref 8–23)
CO2: 25 mmol/L (ref 22–32)
Calcium: 8.8 mg/dL — ABNORMAL LOW (ref 8.9–10.3)
Chloride: 102 mmol/L (ref 98–111)
Creatinine, Ser: 1.08 mg/dL (ref 0.61–1.24)
GFR, Estimated: >60 mL/min (ref >60)
Glucose, Bld: 181 mg/dL — ABNORMAL HIGH (ref 70–99)
Potassium: 4.3 mmol/L (ref 3.5–5.1)
Sodium: 136 mmol/L (ref 135–145)

## 2021-09-12 LAB — CBC WITH DIFFERENTIAL/PLATELET
Abs Immature Granulocytes: 0.2 10*3/uL — ABNORMAL HIGH (ref 0.00–0.07)
Basophils Absolute: 0.1 10*3/uL (ref 0.0–0.1)
Basophils Relative: 1 %
Eosinophils Absolute: 0.3 10*3/uL (ref 0.0–0.5)
Eosinophils Relative: 2 %
HCT: 40.3 % (ref 39.0–52.0)
Hemoglobin: 11.9 g/dL — ABNORMAL LOW (ref 13.0–17.0)
Immature Granulocytes: 2 %
Lymphocytes Relative: 16 %
Lymphs Abs: 2.2 10*3/uL (ref 0.7–4.0)
MCH: 25.6 pg — ABNORMAL LOW (ref 26.0–34.0)
MCHC: 29.5 g/dL — ABNORMAL LOW (ref 30.0–36.0)
MCV: 86.9 fL (ref 80.0–100.0)
Monocytes Absolute: 1 10*3/uL (ref 0.1–1.0)
Monocytes Relative: 8 %
Neutro Abs: 9.8 10*3/uL — ABNORMAL HIGH (ref 1.7–7.7)
Neutrophils Relative %: 71 %
Platelets: 292 10*3/uL (ref 150–400)
RBC: 4.64 MIL/uL (ref 4.22–5.81)
RDW: 17.8 % — ABNORMAL HIGH (ref 11.5–15.5)
WBC: 13.7 10*3/uL — ABNORMAL HIGH (ref 4.0–10.5)
nRBC: 0.1 % (ref 0.0–0.2)

## 2021-09-12 MED ORDER — MORPHINE SULFATE (PF) 4 MG/ML IV SOLN
4.0000 mg | Freq: Once | INTRAVENOUS | Status: AC
  Administered 2021-09-12: 4 mg via INTRAMUSCULAR
  Filled 2021-09-12: qty 1

## 2021-09-12 MED ORDER — LIDOCAINE 5 % EX PTCH
1.0000 | MEDICATED_PATCH | CUTANEOUS | Status: DC
  Administered 2021-09-12 – 2021-09-15 (×4): 1 via TRANSDERMAL
  Filled 2021-09-12 (×5): qty 1

## 2021-09-12 MED ORDER — HYDROMORPHONE HCL 1 MG/ML IJ SOLN
1.0000 mg | Freq: Once | INTRAMUSCULAR | Status: AC
  Administered 2021-09-12: 1 mg via INTRAMUSCULAR
  Filled 2021-09-12: qty 1

## 2021-09-12 MED ORDER — ACETAMINOPHEN 325 MG PO TABS
650.0000 mg | ORAL_TABLET | Freq: Once | ORAL | Status: AC
  Administered 2021-09-12: 650 mg via ORAL
  Filled 2021-09-12: qty 2

## 2021-09-12 NOTE — ED Triage Notes (Signed)
Pt presents via EMS from home with c/o back pain that radiates from right flank to right foot. Pt has a hx of sciatica. Pt barely ambulatory on scene per EMS.

## 2021-09-12 NOTE — ED Provider Notes (Signed)
Care transferred from [facility], PA-C at time of sign out. See their note for full assessment.   Briefly: Patient is 76 y.o. male with history of diabetes, PAD who presents to the ED with lower back pain.  Patient is followed by orthopedist and has received spinal injections in the past.  Patient with [name] twisting mechanism prior to the onset of his symptoms.  Denies recent fall.     Plan: Plan per previous PA-C: Patient pain is better, however, patient began to feel lightheaded when being ambulated by staff. Pt provided with food. If symptoms resolved with PO intake. Then patient can be discharged and follow up with his scheduled appointment on 09/13/2021. Pt walks with [name] walker at baseline. If patient can't walk after PO intake, then CT thoracic-lumbar study, admit for pain control and ortho consult.    Clinical Course as of 09/13/21 0322  Mon Sep 13, 2021  0015 Notified by staff that patient was nauseated in the room. Discussed with patient need for patient to eat saltines. Pt agreeable to saltines and ginger ale. Will reassess following PO intake.  [SB]  0052 Notified by paramedic, [name] that patient was able to keep down ginger ale and saltines.  [SB]  0141 Attempted to ambulate patient. Pt able to stand with minimal assistance. However, patient not able to ambulate at this time. Will order CT T and L spine.  [SB]  0148 Discussed with patient plans for CT thoracic and lumbar spine and admission for pain control. Pt notes that his orthopedist was going to order him an MRI during his appointment today.  Patient notes he would like an MRI, discussed with patient that we do not have MRI at this time however we can order [name] CT scan.  Patient agreeable at this time to CT scans. [SB]  0310 Discussed with patient regarding CT findings, will provide patient with antiemetic at this time. [SB]  0321 Consult with hospitalist, Dr. Myna Hidalgo who will evaluate the patient for admission.  [SB]    Clinical Course  User Index [SB] Craig Filippi A, PA-C    Imaging: CT thoracic and lumbar spine notable for:  1. No acute fracture or traumatic subluxation of the thoracic or  lumbar spine.  2. Grade 1 anterolisthesis at L4-5 with moderate bilateral neural  foraminal stenosis.  3. Aortic Atherosclerosis (ICD10-I70.0).   Due to patient with persistent pain despite pain control measures from previous provider team, feel that patient should be admitted at this time for pain control.  Discussed plan with patient and wife at bedside.  They are agreeable with admission at this time.  Patient appears safe for admission at this time.    This chart was dictated using voice recognition software, Dragon. Despite the best efforts of this provider to proofread and correct errors, errors may still occur which can change documentation meaning.   Craig Baksh A, PA-C 09/13/21 0503    [name], MD 09/13/21 [phone]

## 2021-09-12 NOTE — ED Provider Triage Note (Addendum)
Emergency Medicine Provider Triage Evaluation Note  [name] , a 76 y.o. male  was evaluated in triage.  Pt complains of back pain into right leg. Followed by Ortho multiple injections previously. Fall 1 week ago, twisted and felt pop in back and it diffuse pain into right leg.   Seen by ortho on Monday has imaging done wo acute findings  Taking tramadol  Review of Systems  Positive: Back pain, right hip pain Negative:   Physical Exam  There were no vitals taken for this visit. Gen:   Awake, no distress   Resp:  Normal effort  MSK:   Moves extremities without difficulty  Other:    Medical Decision Making  Medically screening exam initiated at 5:28 PM.  Appropriate orders placed.  [name] was informed that the remainder of the evaluation will be completed by another provider, this initial triage assessment does not replace that evaluation, and the importance of remaining in the ED until their evaluation is complete.  Back pain, right hip pain       Raeford Brandenburg A, PA-C 09/12/21 1730

## 2021-09-12 NOTE — ED Provider Notes (Signed)
[name] DEPT Provider Note   CSN: [id number] Arrival date & time: 09/12/21  1708     History  Chief Complaint  Patient presents with   Back Pain    [name] is a 76 y.o. male with past medical history significant for sarcoid, diabetes, PAD here for evaluation of lower back pain.  Patient states this is very present for months.  He is currently being followed by orthopedics for this.  He has gotten spinal injections which have significantly helped.  Last Sunday he went to fall subsequently twisting.  He did not fall to the ground however when this happened he felt a pulling sensation in his back and felt immediate pain to his right lower back into his right gluteus and medial aspect thigh.  He was seen by orthopedics the day after and had images of his lumbar region and right hip.  According to patient they did not find any significant abnormality and are subsequently scheduling him for an MRI.  He denies any bowel or bladder incontinence, saddle paresthesia.  Pain worse with movement.  Does have some tingling to his right great toe which has been present since the incident.  He was given tramadol by orthopedics which has helped some however he still having significant pain.  No pain, numbness to left lower extremity.  No history of AAA, dissection.  No abdominal pain, fever, headache, chest pain, shortness of breath.  No swelling, redness to extremities.  HPI     Home Medications Prior to Admission medications   Medication Sig Start Date End Date Taking? Authorizing Provider  acetaminophen (TYLENOL) 500 MG tablet Take 500 mg by mouth daily as needed for moderate pain or headache.     [provider]  albuterol (PROVENTIL) (2.5 MG/3ML) 0.083% nebulizer solution Take 3 mLs (2.5 mg total) by nebulization every 6 (six) hours as needed for wheezing or shortness of breath. Dx Code D86.0 07/24/14   Elsie Stain, MD  Albuterol Sulfate (PROAIR  RESPICLICK) 672 (90 BASE) MCG/ACT AEPB Inhale 2 puffs into the lungs every 6 (six) hours as needed. 07/24/14   Elsie Stain, MD  aspirin EC 81 MG tablet Take 81 mg by mouth every evening.     [provider]  Cholecalciferol (VITAMIN D3) 3000 UNITS TABS Take 3,000 Units by mouth once a week.    [provider]  clopidogrel (PLAVIX) 75 MG tablet Take 1 tablet (75 mg total) by mouth daily. 04/17/14   Jerline Pain, MD  Coenzyme Q10 (COQ-10) 400 MG CAPS Take 400 mg by mouth 3 (three) times a week.    [provider]  DROPLET INSULIN SYRINGE 31G X 5/16" 1 ML [place]  08/14/18   [provider]  furosemide (LASIX) 80 MG tablet Take 1 tablet (80 mg total) by mouth daily. 10/07/18   Hongalgi, Lenis Dickinson, MD  glipiZIDE (GLUCOTROL) 10 MG tablet Take 10 mg by mouth every evening.    [provider]  insulin aspart protamine- aspart (NOVOLOG MIX 70/30) (70-30) 100 UNIT/ML injection Inject 0.3 mLs (30 Units total) into the skin 2 (two) times daily with a meal. 10/07/18   Hongalgi, Lenis Dickinson, MD  levothyroxine (SYNTHROID) 75 MCG tablet Take 75 mcg by mouth daily. 02/24/21   [provider]  lidocaine (LIDODERM) 5 % Place 1 patch onto the skin daily as needed for pain. 10/21/20   [provider]  losartan (COZAAR) 100 MG tablet Take 100 mg by  mouth daily.    [provider]  montelukast (SINGULAIR) 10 MG tablet Take 10 mg by mouth daily as needed (allergies).  07/18/18   [provider]  Multiple Vitamin (MULTIVITAMIN WITH MINERALS) TABS tablet Take 1 tablet by mouth daily. 10/08/18   Hongalgi, Lenis Dickinson, MD  nitroGLYCERIN (NITROSTAT) 0.4 MG SL tablet Place 1 tablet (0.4 mg total) under the tongue every 5 (five) minutes as needed for chest pain. 07/01/20 04/27/21  Imogene Burn, PA-C  OVER THE COUNTER MEDICATION Take 1 tablet by mouth daily. [place] XL    [provider]  predniSONE (DELTASONE) 10 MG tablet Take 1 tablet (10 mg total) by  mouth daily with breakfast. T 05/15/15   Parrett, Tammy S, NP  RABEprazole (ACIPHEX) 20 MG tablet Take 20 mg by mouth daily. 11/06/19   [provider]  rosuvastatin (CRESTOR) 20 MG tablet Take 1 tablet (20 mg total) by mouth at bedtime. 01/27/21 01/27/22  [name] T, PA-C  RYBELSUS 3 MG TABS Take 1 tablet by mouth every morning. 02/24/21   [provider]      Allergies    Statins, Hydrocortisone, Azithromycin, Miralax [polyethylene glycol], and Metolazone    Review of Systems   Review of Systems  Constitutional: Negative.   HENT: Negative.    Respiratory: Negative.    Cardiovascular: Negative.   Gastrointestinal: Negative.   Genitourinary: Negative.   Musculoskeletal:  Positive for back pain.  Skin: Negative.   Neurological: Negative.   All other systems reviewed and are negative.   Physical Exam Updated Vital Signs BP (!) 100/47   Pulse 79   Temp 97.8 F (36.6 C) (Oral)   Resp 18   SpO2 95%  Physical Exam Physical Exam  Constitutional: Pt appears well-developed and well-nourished. No distress.  HENT:  Head: Normocephalic and atraumatic.  Mouth/Throat: Oropharynx is clear and moist. No oropharyngeal exudate.  Eyes: Conjunctivae are normal.  Neck: Normal range of motion. Neck supple.  Full ROM without pain  Cardiovascular: Normal rate, regular rhythm and intact distal pulses.   Pulmonary/Chest: Effort normal and breath sounds normal. No respiratory distress. Pt has no wheezes.  Abdominal: Soft. Pt exhibits no distension. There is no tenderness, rebound or guarding. No abd bruit or pulsatile mass Musculoskeletal:  Full range of motion of the T-spine and L-spine with flexion, hyperextension, and lateral flexion. No midline tenderness or stepoffs. No tenderness to palpation of the spinous processes of the T-spine or L-spine. Mild tenderness to palpation of the paraspinous muscles of the L-spine.positive straight leg raise on right Lymphadenopathy:     Pt has no cervical adenopathy.  Neurological: Pt is alert. Pt has normal reflexes.  Speech is clear and goal oriented, follows commands Normal 5/5 strength in upper and lower extremities bilaterally including dorsiflexion and plantar flexion, strong and equal grip strength Sensation normal to light and sharp touch Moves extremities without ataxia, coordination intact Normal gait Normal balance Skin: Skin is warm and dry. No rash noted or lesions noted. Pt is not diaphoretic. No erythema, ecchymosis,edema or warmth.  Psychiatric: Pt has a normal mood and affect. Behavior is normal.  Nursing note and vitals reviewed.  ED Results / Procedures / Treatments   Labs (all labs ordered are listed, but only abnormal results are displayed) Labs Reviewed  CBC WITH DIFFERENTIAL/PLATELET - Abnormal; Notable for the following components:      Result Value   WBC 13.7 (*)    Hemoglobin 11.9 (*)    MCH 25.6 (*)  MCHC 29.5 (*)    RDW 17.8 (*)    Neutro Abs 9.8 (*)    Abs Immature Granulocytes 0.20 (*)    All other components within normal limits  BASIC METABOLIC PANEL - Abnormal; Notable for the following components:   Glucose, Bld 181 (*)    Calcium 8.8 (*)    All other components within normal limits  URINALYSIS, ROUTINE W REFLEX MICROSCOPIC    EKG None  Radiology No results found.  Procedures Procedures    Medications Ordered in ED Medications  lidocaine (LIDODERM) 5 % 1 patch (1 patch Transdermal Patch Applied 09/12/21 2051)  morphine (PF) 4 MG/ML injection 4 mg (4 mg Intramuscular Given 09/12/21 2047)  acetaminophen (TYLENOL) tablet 650 mg (650 mg Oral Given 09/12/21 2050)  HYDROmorphone (DILAUDID) injection 1 mg (1 mg Intramuscular Given 09/12/21 2222)    ED Course/ Medical Decision Making/ A&P    76 year old here with chronic back pain currently followed by orthopedics here for evaluation acute on chronic back pain after 1 week ago twisting wrong feeling a pulling sensation to his  right lower back.  Patient without a bowel or bladder incontinence, saddle paresthesia, history of IV drug use or fever.  He has no weakness on exam however does have pain which seems consistent with sciatica.  We will plan on check labs, get his pain under control and go from there.  Given he just had imaging done at his orthopedist 1 week ago we will hold for now.  Do not feel he needs emergent MRI at this time.  Labs personally viewed and interpreted:  CBC leukocytosis 13.7, hemoglobin 0.9 prior to similar Metabolic panel glucose 383   Patient reassessed.  Stated the morphine made his pain worse.  Family and patient are upset that they did not get MRI when they first arrived to the ED.  I discussed with the patient that we typically do MRI when patient seen certain criteria otherwise to try and get her pain under control which seems reasonable given he has orthopedics appointment tomorrow afternoon.  Will give additional dose of pain medicine and reassess.  Attending Dr. Maryan Rued  has discussed plan with patient.  If pain more controlled patient can ambulate can DC home.  If not able to ambulate with walker at baseline will need CT scan thoracic lumbar and admission for pain control.  Went to reassess patient.  States he needs more time prior to attempting to ambulate.  Care assumed by [facility], PA- C who will FU on reassessment and dispo.                           Medical Decision Making Amount and/or Complexity of Data Reviewed Independent Historian: spouse External Data Reviewed: labs and notes. Labs: ordered. Decision-making details documented in ED Course.  Risk OTC drugs. Prescription drug management.          Final Clinical Impression(s) / ED Diagnoses Final diagnoses:  Acute right-sided low back pain with right-sided sciatica    Rx / DC Orders ED Discharge Orders     None         Burhan Barham A, PA-C 09/12/21 2329    Blanchie Dessert, MD 09/15/21  [phone]

## 2021-09-13 ENCOUNTER — Emergency Department (HOSPITAL_COMMUNITY): Payer: Medicare HMO

## 2021-09-13 ENCOUNTER — Observation Stay (HOSPITAL_COMMUNITY): Payer: Medicare HMO

## 2021-09-13 ENCOUNTER — Other Ambulatory Visit: Payer: Self-pay

## 2021-09-13 ENCOUNTER — Encounter (HOSPITAL_COMMUNITY): Payer: Self-pay | Admitting: Family Medicine

## 2021-09-13 DIAGNOSIS — D72825 Bandemia: Secondary | ICD-10-CM | POA: Diagnosis present

## 2021-09-13 DIAGNOSIS — G9340 Encephalopathy, unspecified: Secondary | ICD-10-CM | POA: Diagnosis not present

## 2021-09-13 DIAGNOSIS — M5116 Intervertebral disc disorders with radiculopathy, lumbar region: Secondary | ICD-10-CM | POA: Diagnosis not present

## 2021-09-13 DIAGNOSIS — M5459 Other low back pain: Secondary | ICD-10-CM | POA: Diagnosis present

## 2021-09-13 DIAGNOSIS — I5032 Chronic diastolic (congestive) heart failure: Secondary | ICD-10-CM

## 2021-09-13 DIAGNOSIS — J9601 Acute respiratory failure with hypoxia: Secondary | ICD-10-CM | POA: Diagnosis not present

## 2021-09-13 DIAGNOSIS — E039 Hypothyroidism, unspecified: Secondary | ICD-10-CM

## 2021-09-13 DIAGNOSIS — D860 Sarcoidosis of lung: Secondary | ICD-10-CM | POA: Diagnosis not present

## 2021-09-13 DIAGNOSIS — G4733 Obstructive sleep apnea (adult) (pediatric): Secondary | ICD-10-CM

## 2021-09-13 DIAGNOSIS — E119 Type 2 diabetes mellitus without complications: Secondary | ICD-10-CM

## 2021-09-13 DIAGNOSIS — M47816 Spondylosis without myelopathy or radiculopathy, lumbar region: Secondary | ICD-10-CM | POA: Diagnosis not present

## 2021-09-13 DIAGNOSIS — I25118 Atherosclerotic heart disease of native coronary artery with other forms of angina pectoris: Secondary | ICD-10-CM | POA: Diagnosis not present

## 2021-09-13 LAB — GLUCOSE, CAPILLARY
Glucose-Capillary: 320 mg/dL — ABNORMAL HIGH (ref 70–99)
Glucose-Capillary: 311 mg/dL — ABNORMAL HIGH (ref 70–99)
Glucose-Capillary: 232 mg/dL — ABNORMAL HIGH (ref 70–99)
Glucose-Capillary: 227 mg/dL — ABNORMAL HIGH (ref 70–99)

## 2021-09-13 LAB — URINALYSIS, ROUTINE W REFLEX MICROSCOPIC
Bacteria, UA: NONE SEEN
Bilirubin Urine: NEGATIVE
Glucose, UA: 50 mg/dL — AB
Hgb urine dipstick: NEGATIVE
Ketones, ur: NEGATIVE mg/dL
Leukocytes,Ua: NEGATIVE
Nitrite: NEGATIVE
Protein, ur: 30 mg/dL — AB
Specific Gravity, Urine: 1.018 (ref 1.005–1.030)
pH: 5 (ref 5.0–8.0)

## 2021-09-13 MED ORDER — LEVOTHYROXINE SODIUM 75 MCG PO TABS
75.0000 ug | ORAL_TABLET | Freq: Every day | ORAL | Status: DC
  Administered 2021-09-13 – 2021-09-16 (×4): 75 ug via ORAL
  Filled 2021-09-13 (×4): qty 1

## 2021-09-13 MED ORDER — ENOXAPARIN SODIUM 40 MG/0.4ML IJ SOSY
40.0000 mg | PREFILLED_SYRINGE | INTRAMUSCULAR | Status: DC
  Administered 2021-09-13 – 2021-09-15 (×3): 40 mg via SUBCUTANEOUS
  Filled 2021-09-13 (×3): qty 0.4

## 2021-09-13 MED ORDER — ONDANSETRON HCL 4 MG/2ML IJ SOLN
4.0000 mg | Freq: Four times a day (QID) | INTRAMUSCULAR | Status: DC | PRN
  Administered 2021-09-13: 4 mg via INTRAVENOUS
  Filled 2021-09-13: qty 2

## 2021-09-13 MED ORDER — GABAPENTIN 300 MG PO CAPS
300.0000 mg | ORAL_CAPSULE | Freq: Three times a day (TID) | ORAL | Status: DC
  Administered 2021-09-13 – 2021-09-14 (×4): 300 mg via ORAL
  Filled 2021-09-13 (×5): qty 1

## 2021-09-13 MED ORDER — ORAL CARE MOUTH RINSE: 15.0000 mL | OROMUCOSAL | Status: DC | PRN

## 2021-09-13 MED ORDER — ONDANSETRON 4 MG PO TBDP
4.0000 mg | ORAL_TABLET | Freq: Once | ORAL | Status: AC
Start: 2021-09-13 — End: 2021-09-13
  Administered 2021-09-13: 4 mg via ORAL
  Filled 2021-09-13: qty 1

## 2021-09-13 MED ORDER — OXYCODONE HCL 5 MG PO TABS: 5.0000 mg | ORAL_TABLET | ORAL | Status: DC | PRN

## 2021-09-13 MED ORDER — MONTELUKAST SODIUM 10 MG PO TABS: 10.0000 mg | ORAL_TABLET | Freq: Every day | ORAL | Status: DC | PRN

## 2021-09-13 MED ORDER — CLOPIDOGREL BISULFATE 75 MG PO TABS
75.0000 mg | ORAL_TABLET | Freq: Every day | ORAL | Status: DC
  Administered 2021-09-13 – 2021-09-16 (×4): 75 mg via ORAL
  Filled 2021-09-13 (×4): qty 1

## 2021-09-13 MED ORDER — SENNOSIDES-DOCUSATE SODIUM 8.6-50 MG PO TABS: 1.0000 | ORAL_TABLET | Freq: Two times a day (BID) | ORAL | Status: DC | PRN

## 2021-09-13 MED ORDER — PREDNISONE 5 MG PO TABS
10.0000 mg | ORAL_TABLET | Freq: Every day | ORAL | Status: DC
  Administered 2021-09-13: 10 mg via ORAL
  Filled 2021-09-13: qty 2

## 2021-09-13 MED ORDER — SODIUM CHLORIDE 0.9 % IV BOLUS
1000.0000 mL | Freq: Once | INTRAVENOUS | Status: AC
  Administered 2021-09-13: 1000 mL via INTRAVENOUS

## 2021-09-13 MED ORDER — ACETAMINOPHEN 325 MG PO TABS
650.0000 mg | ORAL_TABLET | Freq: Four times a day (QID) | ORAL | Status: DC
  Administered 2021-09-13 – 2021-09-16 (×11): 650 mg via ORAL
  Filled 2021-09-13 (×12): qty 2

## 2021-09-13 MED ORDER — ASPIRIN 81 MG PO TBEC
81.0000 mg | DELAYED_RELEASE_TABLET | Freq: Every evening | ORAL | Status: DC
  Administered 2021-09-13 – 2021-09-15 (×3): 81 mg via ORAL
  Filled 2021-09-13 (×3): qty 1

## 2021-09-13 MED ORDER — PANTOPRAZOLE SODIUM 40 MG PO TBEC
40.0000 mg | DELAYED_RELEASE_TABLET | Freq: Every day | ORAL | Status: DC
  Administered 2021-09-13 – 2021-09-16 (×4): 40 mg via ORAL
  Filled 2021-09-13 (×4): qty 1

## 2021-09-13 MED ORDER — METHYLPREDNISOLONE SODIUM SUCC 40 MG IJ SOLR
40.0000 mg | INTRAMUSCULAR | Status: DC
  Administered 2021-09-13: 40 mg via INTRAVENOUS
  Filled 2021-09-13: qty 1

## 2021-09-13 MED ORDER — INSULIN ASPART PROT & ASPART (70-30 MIX) 100 UNIT/ML ~~LOC~~ SUSP
30.0000 [IU] | Freq: Two times a day (BID) | SUBCUTANEOUS | Status: DC
  Administered 2021-09-13 – 2021-09-14 (×2): 30 [IU] via SUBCUTANEOUS
  Filled 2021-09-13: qty 10

## 2021-09-13 MED ORDER — INSULIN GLARGINE-YFGN 100 UNIT/ML ~~LOC~~ SOLN
15.0000 [IU] | Freq: Two times a day (BID) | SUBCUTANEOUS | Status: DC
Start: 2021-09-13 — End: 2021-09-13
  Filled 2021-09-13 (×2): qty 0.15

## 2021-09-13 MED ORDER — HYDROMORPHONE HCL 1 MG/ML IJ SOLN
0.5000 mg | INTRAMUSCULAR | Status: DC | PRN
  Administered 2021-09-13 – 2021-09-15 (×2): 0.5 mg via INTRAVENOUS
  Filled 2021-09-13 (×2): qty 0.5

## 2021-09-13 MED ORDER — INSULIN ASPART 100 UNIT/ML IJ SOLN: 0.0000 [IU] | Freq: Every day | INTRAMUSCULAR | Status: DC

## 2021-09-13 MED ORDER — INSULIN ASPART 100 UNIT/ML IJ SOLN
0.0000 [IU] | Freq: Three times a day (TID) | INTRAMUSCULAR | Status: DC
  Administered 2021-09-13: 3 [IU] via SUBCUTANEOUS
  Administered 2021-09-14: 7 [IU] via SUBCUTANEOUS

## 2021-09-13 MED ORDER — METHOCARBAMOL 500 MG PO TABS
500.0000 mg | ORAL_TABLET | Freq: Four times a day (QID) | ORAL | Status: DC | PRN
Start: 2021-09-13 — End: 2021-09-16
  Administered 2021-09-13 – 2021-09-14 (×2): 500 mg via ORAL
  Filled 2021-09-13 (×2): qty 1

## 2021-09-13 MED ORDER — METHYLPREDNISOLONE SODIUM SUCC 125 MG IJ SOLR: 125.0000 mg | Freq: Once | INTRAMUSCULAR | Status: DC

## 2021-09-13 MED ORDER — POLYETHYLENE GLYCOL 3350 17 G PO PACK: 17.0000 g | PACK | Freq: Two times a day (BID) | ORAL | Status: DC | PRN

## 2021-09-13 MED ORDER — OXYCODONE HCL 5 MG PO TABS
5.0000 mg | ORAL_TABLET | Freq: Four times a day (QID) | ORAL | Status: DC | PRN
  Administered 2021-09-15 – 2021-09-16 (×2): 5 mg via ORAL
  Filled 2021-09-13 (×3): qty 1

## 2021-09-13 MED ORDER — ALBUTEROL SULFATE (2.5 MG/3ML) 0.083% IN NEBU: 2.5000 mg | INHALATION_SOLUTION | Freq: Four times a day (QID) | RESPIRATORY_TRACT | Status: DC | PRN

## 2021-09-13 MED ORDER — INSULIN ASPART 100 UNIT/ML IJ SOLN
3.0000 [IU] | Freq: Three times a day (TID) | INTRAMUSCULAR | Status: DC
Start: 2021-09-13 — End: 2021-09-13

## 2021-09-13 MED ORDER — LOSARTAN POTASSIUM 50 MG PO TABS: 100.0000 mg | ORAL_TABLET | Freq: Every day | ORAL | Status: DC

## 2021-09-13 MED ORDER — METHYLPREDNISOLONE SODIUM SUCC 40 MG IJ SOLR
40.0000 mg | Freq: Once | INTRAMUSCULAR | Status: DC
Start: 2021-09-13 — End: 2021-09-13

## 2021-09-13 MED ORDER — INSULIN ASPART 100 UNIT/ML IJ SOLN
0.0000 [IU] | Freq: Every day | INTRAMUSCULAR | Status: DC
  Administered 2021-09-13: 2 [IU] via SUBCUTANEOUS

## 2021-09-13 MED ORDER — INSULIN ASPART 100 UNIT/ML IJ SOLN
0.0000 [IU] | Freq: Three times a day (TID) | INTRAMUSCULAR | Status: DC
  Administered 2021-09-13 (×2): 7 [IU] via SUBCUTANEOUS

## 2021-09-13 NOTE — Progress Notes (Signed)
   09/13/21 1501  Assess: MEWS Score  Temp 99.7 F (37.6 C)  BP (!) 98/53  MAP (mmHg) 66  Pulse Rate (!) 101  Resp 17  Level of Consciousness Alert  SpO2 94 %  O2 Device Nasal Cannula  Patient Activity (if Appropriate) In bed  O2 Flow Rate (L/min) 2 L/min  Assess: MEWS Score  MEWS Temp 0  MEWS Systolic 1  MEWS Pulse 1  MEWS RR 0  MEWS LOC 0  MEWS Score 2  MEWS Score Color Yellow  Assess: if the MEWS score is Yellow or Red  Were vital signs taken at a resting state? Yes  Focused Assessment No change from prior assessment  Does the patient meet 2 or more of the SIRS criteria? No  MEWS guidelines implemented *See Row Information* Yes  Treat  MEWS Interventions Administered prn meds/treatments  Pain Scale 0-10  Pain Score 6  Pain Type Acute pain  Pain Location Back  Pain Orientation Lower  Pain Descriptors / Indicators Aching  Pain Frequency Intermittent  Pain Onset On-going  Pain Intervention(s) Rest  Neuro symptoms relieved by Rest  Take Vital Signs  Increase Vital Sign Frequency  Yellow: Q 2hr X 2 then Q 4hr X 2, if remains yellow, continue Q 4hrs  Escalate  MEWS: Escalate Yellow: discuss with charge nurse/RN and consider discussing with provider and RRT  Notify: Charge Nurse/RN  Name of Charge Nurse/RN Notified Hinton Dyer, Rn  Date Charge Nurse/RN Notified 09/13/21  Time Charge Nurse/RN Notified 60  Notify: Provider  Provider Name/Title Dr. Bettina Gavia  Date Provider Notified 09/13/21  Time Provider Notified 1542  Method of Notification Page  Notification Reason Other (Comment) (low BP 82/42 taken manually)  Provider response See new orders  Notify: Rapid Response  Name of Rapid Response RN Notified n/a  Document  Patient Outcome Other (Comment) (received and carried out new order, Pt monitored)  Progress note created (see row info) Yes  Assess: SIRS CRITERIA  SIRS Temperature  0  SIRS Pulse 1  SIRS Respirations  0  SIRS WBC 0  SIRS Score Sum  1    Pt  alert and oriented x 4, complaining of dizziness and weakness. BP manually checked 82/42, MD paged, received and carried out new order. Yellow MEWS protocol implemented. Pt now resting comfortably in bed, wife at bedside updated.

## 2021-09-13 NOTE — Progress Notes (Signed)
Notified by patient's RN that patient is hypotensive to 80s/40s after her return from MRI.  He is slightly tachycardic and a little dizzy.  He did not receive his losartan this morning.  -IV NS bolus 1 L x 1 -Stress dose steroids with Solu-Medrol 40 mg daily -Continue monitoring

## 2021-09-13 NOTE — Plan of Care (Signed)
  Problem: Education: Goal: Knowledge of General Education information will improve Description: Including pain rating scale, medication(s)/side effects and non-pharmacologic comfort measures Outcome: Progressing   Problem: Clinical Measurements: Goal: Ability to maintain clinical measurements within normal limits will improve Outcome: Progressing   Problem: Nutrition: Goal: Adequate nutrition will be maintained Outcome: Progressing   Problem: Coping: Goal: Level of anxiety will decrease Outcome: Progressing   Problem: Pain Managment: Goal: General experience of comfort will improve Outcome: Progressing   Problem: Safety: Goal: Ability to remain free from injury will improve Outcome: Progressing   Problem: Skin Integrity: Goal: Risk for impaired skin integrity will decrease Outcome: Progressing   

## 2021-09-13 NOTE — H&P (Signed)
History and Physical    [name] UYQ:[id number] DOB: Jun 02, 1945 DOA: 09/12/2021  PCP: Shirline Frees, MD   Patient coming from: Home   Chief Complaint: Back pain   HPI: [name] is a pleasant 76 y.o. male with medical history significant for CAD with stents, OSA, insulin-dependent diabetes mellitus, sarcoidosis on prednisone, and chronic low back pain with sciatica who now presents with severe worsening in his chronic low back pain.  Patient reports that his low back pain and sciatica had improved after recent injection in the orthopedic clinic, but he stumbled on 09/05/2021, twisting awkwardly, and experienced immediate increase in low back pain with radiation down his right leg.  He was seen in the orthopedic clinic the following day where he reports that x-rays were obtained and he was prescribed a muscle relaxant.  He continued to have severe pain and went back to the clinic where he was given tramadol.  Despite this, he has been unable to get out of bed due to the severe pain with any movement.  He denies change in bowel or bladder function, numbness in his saddle area, or leg weakness.  He has increase in his chronic right leg numbness.  Denies fevers, chills, chest pain, cough, or shortness of breath.  ED Course: Upon arrival to the ED, patient is found to be afebrile and saturating low 90s on room air with stable blood pressure.  No acute fracture or traumatic subluxation noted on CT of the thoracic and lumbar spine.  Patient was treated with Tylenol, Dilaudid, morphine, and topical lidocaine in the ED with some improvement, but is still unable to ambulate at all due to severe pain.  Review of Systems:  All other systems reviewed and apart from HPI, are negative.  Past Medical History:  Diagnosis Date   Adrenal insufficiency ([place])    Allergy    Anemia    Arthritis    feet    Broken foot 09/2019   had to wore a boot. Left    CKD (chronic kidney disease), stage III (HCC)     Coronary artery disease    has stents   Coronary atherosclerosis of native coronary artery    Proximal LAD, posterior lateral stent widely patent-10/12/11   Diabetes mellitus    insulin and pills   Hearing loss    wears hearing aids   Heart attack ([place]) 2010   History of blood transfusion 06/30/2016   [facility] - 2 units transfused   Hypertension    OSA (obstructive sleep apnea)    uses VPAC sleep study 2 years done through [facility]. Dr. Marlou Porch arranged study   Pneumonia    3-4 years ago   Sarcoid    Sarcoidosis    Sleep apnea    uses CPAP nightly   Thyroid disease    Type 2 diabetes mellitus ([place])     Past Surgical History:  Procedure Laterality Date   APPENDECTOMY     CATARACT EXTRACTION  2011   bilat   CHOLECYSTECTOMY  01/25/2011   Procedure: LAPAROSCOPIC CHOLECYSTECTOMY WITH INTRAOPERATIVE CHOLANGIOGRAM;  Surgeon: Judieth Keens, DO;  Location: [facility];  Service: General;  Laterality: N/A;   COLONOSCOPY     several   COLONOSCOPY WITH PROPOFOL N/A 01/03/2020   Procedure: COLONOSCOPY WITH PROPOFOL;  Surgeon: Gatha Mayer, MD;  Location: [facility];  Service: Endoscopy;  Laterality: N/A;   CORONARY ANGIOPLASTY     most recent 11/2009   CORONARY STENT INTERVENTION N/A 08/07/2018  Procedure: CORONARY STENT INTERVENTION;  Surgeon: Jettie Booze, MD;  Location: [facility];  Service: Cardiovascular;  Laterality: N/A;   CORONARY STENT INTERVENTION N/A 01/18/2021   Procedure: CORONARY STENT INTERVENTION;  Surgeon: Sherren Mocha, MD;  Location: [place] [facility];  Service: Cardiovascular;  Laterality: N/A;   CORONARY STENT PLACEMENT  2009   in LAD and side branch PTCA   ESOPHAGOGASTRODUODENOSCOPY (EGD) WITH PROPOFOL N/A 01/03/2020   Procedure: ESOPHAGOGASTRODUODENOSCOPY (EGD) WITH PROPOFOL;  Surgeon: Gatha Mayer, MD;  Location: [facility];  Service: Endoscopy;  Laterality: N/A;   HEMOSTASIS CLIP PLACEMENT  01/03/2020   Procedure: HEMOSTASIS CLIP  PLACEMENT;  Surgeon: Gatha Mayer, MD;  Location: [facility];  Service: Endoscopy;;   INTERCOSTAL NERVE BLOCK  2011, 06/2016   x2. lumbar spine   INTRAVASCULAR PRESSURE WIRE/FFR STUDY N/A 01/18/2021   Procedure: INTRAVASCULAR PRESSURE WIRE/FFR STUDY;  Surgeon: Sherren Mocha, MD;  Location: [place];  Service: Cardiovascular;  Laterality: N/A;   LEFT HEART CATHETERIZATION WITH CORONARY ANGIOGRAM Bilateral 10/12/2011   Procedure: LEFT HEART CATHETERIZATION WITH CORONARY ANGIOGRAM;  Surgeon: Candee Furbish, MD;  Location: [facility] CATH LAB;  Service: Cardiovascular;  Laterality: Bilateral;   LEFT HEART CATHETERIZATION WITH CORONARY ANGIOGRAM N/A 04/01/2014   Procedure: LEFT HEART CATHETERIZATION WITH CORONARY ANGIOGRAM;  Surgeon: Candee Furbish, MD;  Location: [facility] CATH LAB;  Service: Cardiovascular;  Laterality: N/A;   POLYPECTOMY  01/03/2020   Procedure: POLYPECTOMY;  Surgeon: Gatha Mayer, MD;  Location: [facility];  Service: Endoscopy;;   RIGHT/LEFT HEART CATH AND CORONARY ANGIOGRAPHY N/A 08/07/2018   Procedure: RIGHT/LEFT HEART CATH AND CORONARY ANGIOGRAPHY;  Surgeon: Jettie Booze, MD;  Location: [place];  Service: Cardiovascular;  Laterality: N/A;   RIGHT/LEFT HEART CATH AND CORONARY ANGIOGRAPHY N/A 12/25/2020   Procedure: RIGHT/LEFT HEART CATH AND CORONARY ANGIOGRAPHY;  Surgeon: Belva Crome, MD;  Location: [facility];  Service: Cardiovascular;  Laterality: N/A;    Social History:   reports that he has never smoked. He has never used smokeless tobacco. He reports that he does not drink alcohol and does not use drugs.  Allergies  Allergen Reactions   Statins Other (See Comments)    Pt says they make him "mean"   Hydrocortisone Nausea Only   Ranexa [Ranolazine] Other (See Comments)    Severe weakness/near syncope after 1 dose hallucinations   Azithromycin     Raises blood sugar   Miralax [Polyethylene Glycol] Nausea And Vomiting    Patient refuses to  take- reminded me today of this. ta   Metolazone Other (See Comments)    Drained, no energy    Family History  Problem Relation Age of Onset   Kidney disease Mother    Diabetes Mother    Kidney cancer Mother    Heart attack Father    Asthma Sister    Anesthesia problems Sister        "Kidney's did not wake up"   Sarcoidosis Sister    Sarcoidosis Niece    Colon cancer Neg Hx    Colon polyps Neg Hx    Rectal cancer Neg Hx    Stomach cancer Neg Hx      Prior to Admission medications   Medication Sig Start Date End Date Taking? Authorizing Provider  acetaminophen (TYLENOL) 500 MG tablet Take 500 mg by mouth daily as needed for moderate pain or headache.    Yes [provider]  albuterol (PROVENTIL) (2.5 MG/3ML) 0.083% nebulizer  solution Take 3 mLs (2.5 mg total) by nebulization every 6 (six) hours as needed for wheezing or shortness of breath. Dx Code D86.0 07/24/14  Yes Elsie Stain, MD  Albuterol Sulfate (PROAIR RESPICLICK) 144 (90 BASE) MCG/ACT AEPB Inhale 2 puffs into the lungs every 6 (six) hours as needed. Patient taking differently: Inhale 2 puffs into the lungs every 6 (six) hours as needed (for shortness of breath). 07/24/14  Yes Elsie Stain, MD  aspirin EC 81 MG tablet Take 81 mg by mouth every evening.    Yes [provider]  Cholecalciferol (VITAMIN D3) 3000 UNITS TABS Take 3,000 Units by mouth once a week. Sunday   Yes [provider]  clopidogrel (PLAVIX) 75 MG tablet Take 1 tablet (75 mg total) by mouth daily. 04/17/14  Yes Jerline Pain, MD  Coenzyme Q10 (COQ-10) 400 MG CAPS Take 400 mg by mouth 3 (three) times a week.   Yes [provider]  ezetimibe (ZETIA) 10 MG tablet Take 10 mg by mouth once a week. Sunday 06/25/21  Yes [provider]  furosemide (LASIX) 80 MG tablet Take 1 tablet (80 mg total) by mouth daily. 10/07/18  Yes Hongalgi, Lenis Dickinson, MD  insulin aspart protamine- aspart (NOVOLOG MIX 70/30) (70-30) 100  UNIT/ML injection Inject 0.3 mLs (30 Units total) into the skin 2 (two) times daily with a meal. 10/07/18  Yes Hongalgi, Lenis Dickinson, MD  levothyroxine (SYNTHROID) 75 MCG tablet Take 75 mcg by mouth daily. 02/24/21  Yes [provider]  lidocaine (LIDODERM) 5 % Place 1 patch onto the skin daily as needed for pain. 10/21/20  Yes [provider]  losartan (COZAAR) 100 MG tablet Take 100 mg by mouth daily.   Yes [provider]  montelukast (SINGULAIR) 10 MG tablet Take 10 mg by mouth daily as needed (allergies).  07/18/18  Yes [provider]  Multiple Vitamin (MULTIVITAMIN WITH MINERALS) TABS tablet Take 1 tablet by mouth daily. 10/08/18  Yes Hongalgi, Lenis Dickinson, MD  OVER THE COUNTER MEDICATION Take 1 tablet by mouth daily. Omaga XL   Yes [provider]  potassium chloride (MICRO-K) 10 MEQ CR capsule Take 10 mEq by mouth daily. 06/02/21  Yes [provider]  predniSONE (DELTASONE) 10 MG tablet Take 1 tablet (10 mg total) by mouth daily with breakfast. T 05/15/15  Yes Parrett, Tammy S, NP  RABEprazole (ACIPHEX) 20 MG tablet Take 20 mg by mouth daily as needed (for acid reflux). 11/06/19  Yes [provider]  [place] X 5/16" 1 ML [place]  08/14/18   [provider]  glipiZIDE (GLUCOTROL) 10 MG tablet Take 10 mg by mouth every evening. Patient not taking: Reported on 09/13/2021    [provider]  nitroGLYCERIN (NITROSTAT) 0.4 MG SL tablet Place 1 tablet (0.4 mg total) under the tongue every 5 (five) minutes as needed for chest pain. 07/01/20 04/27/21  Imogene Burn, PA-C  rosuvastatin (CRESTOR) 20 MG tablet Take 1 tablet (20 mg total) by mouth at bedtime. Patient not taking: Reported on 09/13/2021 01/27/21 01/27/22  [name] T, PA-C    Physical Exam: Vitals:   09/13/21 0340 09/13/21 0345 09/13/21 0358 09/13/21 0522  BP:  133/66    Pulse:  86    Resp:      Temp:   97.7 F (36.5 C)   TempSrc:   Oral   SpO2: (!)  86% 93%    Weight:    92.4 kg  Height:    '5\' 10"'$  (1.778 m)    Constitutional: NAD, calm  Eyes: PERTLA, lids and conjunctivae normal ENMT: Mucous membranes are moist. Posterior pharynx clear of any exudate or lesions.   Neck: supple, no masses  Respiratory: no wheezing, no crackles. No accessory muscle use.  Cardiovascular: S1 & S2 heard, regular rate and rhythm. No significant JVD. Abdomen: No distension, no tenderness, soft. Bowel sounds active.  Musculoskeletal: no clubbing / cyanosis. No joint deformity upper and lower extremities.   Skin: no significant rashes, lesions, ulcers. Warm, dry, well-perfused. Neurologic: CN 2-12 grossly intact. Sensation to light touch diminished in RLE. LE strength testing limited by pain. Alert and oriented.  Psychiatric: Pleasant. Cooperative.    Labs and Imaging on Admission: I have personally reviewed following labs and imaging studies  CBC: Recent Labs  Lab 09/12/21 1820  WBC 13.7*  NEUTROABS 9.8*  HGB 11.9*  HCT 40.3  MCV 86.9  PLT 716   Basic Metabolic Panel: Recent Labs  Lab 09/12/21 1820  NA 136  K 4.3  CL 102  CO2 25  GLUCOSE 181*  BUN 18  CREATININE 1.08  CALCIUM 8.8*   GFR: Estimated Creatinine Clearance: 67.5 mL/min (by C-G formula based on SCr of 1.08 mg/dL). Liver Function Tests: No results for input(s): "AST", "ALT", "ALKPHOS", "BILITOT", "PROT", "ALBUMIN" in the last 168 hours. No results for input(s): "LIPASE", "AMYLASE" in the last 168 hours. No results for input(s): "AMMONIA" in the last 168 hours. Coagulation Profile: No results for input(s): "INR", "PROTIME" in the last 168 hours. Cardiac Enzymes: No results for input(s): "CKTOTAL", "CKMB", "CKMBINDEX", "TROPONINI" in the last 168 hours. BNP (last 3 results) No results for input(s): "PROBNP" in the last 8760 hours. HbA1C: No results for input(s): "HGBA1C" in the last 72 hours. CBG: No results for input(s): "GLUCAP" in the last 168 hours. Lipid  Profile: No results for input(s): "CHOL", "HDL", "LDLCALC", "TRIG", "CHOLHDL", "LDLDIRECT" in the last 72 hours. Thyroid Function Tests: No results for input(s): "TSH", "T4TOTAL", "FREET4", "T3FREE", "THYROIDAB" in the last 72 hours. Anemia Panel: No results for input(s): "VITAMINB12", "FOLATE", "FERRITIN", "TIBC", "IRON", "RETICCTPCT" in the last 72 hours. Urine analysis:    Component Value Date/Time   COLORURINE YELLOW 10/06/2018 1324   APPEARANCEUR CLEAR 10/06/2018 1324   LABSPEC 1.010 10/06/2018 1324   PHURINE 6.0 10/06/2018 1324   GLUCOSEU >=500 (A) 10/06/2018 1324   HGBUR NEGATIVE 10/06/2018 1324   BILIRUBINUR NEGATIVE 10/06/2018 1324   KETONESUR NEGATIVE 10/06/2018 1324   PROTEINUR NEGATIVE 10/06/2018 1324   UROBILINOGEN 0.2 11/09/2010 2146   NITRITE NEGATIVE 10/06/2018 1324   LEUKOCYTESUR NEGATIVE 10/06/2018 1324   Sepsis Labs: '@LABRCNTIP'$ (procalcitonin:4,lacticidven:4) )No results found for this or any previous visit (from the past 240 hour(s)).   Radiological Exams on Admission: CT Thoracic Spine Wo Contrast  Result Date: 09/13/2021 CLINICAL DATA:  Mid-back pain back pain status post twisting mechanism; back pain status post twisting mechanism. EXAM: CT THORACIC AND LUMBAR SPINE WITHOUT CONTRAST TECHNIQUE: Multidetector CT imaging of the thoracic and lumbar spine was performed without contrast. Multiplanar CT image reconstructions were also generated. RADIATION DOSE REDUCTION: This exam was performed according to the departmental dose-optimization program which includes automated exposure control, adjustment of the mA and/or kV according to patient size and/or use of iterative reconstruction technique. COMPARISON:  None Available. FINDINGS: CT THORACIC SPINE FINDINGS Alignment: Normal. Vertebrae: No acute fracture or focal pathologic process. Paraspinal and other soft tissues: Calcific aortic atherosclerosis. Disc levels: No spinal canal stenosis. CT  LUMBAR SPINE FINDINGS  Segmentation: 5 lumbar type vertebrae. Alignment: Grade 1 anterolisthesis at L4-5 Vertebrae: No acute fracture or focal pathologic process. Paraspinal and other soft tissues: Negative Disc levels: Moderate bilateral L4 neural foraminal stenosis. IMPRESSION: 1. No acute fracture or traumatic subluxation of the thoracic or lumbar spine. 2. Grade 1 anterolisthesis at L4-5 with moderate bilateral neural foraminal stenosis. 3. Aortic Atherosclerosis (ICD10-I70.0). Electronically Signed   By: Ulyses Jarred M.D.   On: 09/13/2021 02:49   CT Lumbar Spine Wo Contrast  Result Date: 09/13/2021 CLINICAL DATA:  Mid-back pain back pain status post twisting mechanism; back pain status post twisting mechanism. EXAM: CT THORACIC AND LUMBAR SPINE WITHOUT CONTRAST TECHNIQUE: Multidetector CT imaging of the thoracic and lumbar spine was performed without contrast. Multiplanar CT image reconstructions were also generated. RADIATION DOSE REDUCTION: This exam was performed according to the departmental dose-optimization program which includes automated exposure control, adjustment of the mA and/or kV according to patient size and/or use of iterative reconstruction technique. COMPARISON:  None Available. FINDINGS: CT THORACIC SPINE FINDINGS Alignment: Normal. Vertebrae: No acute fracture or focal pathologic process. Paraspinal and other soft tissues: Calcific aortic atherosclerosis. Disc levels: No spinal canal stenosis. CT LUMBAR SPINE FINDINGS Segmentation: 5 lumbar type vertebrae. Alignment: Grade 1 anterolisthesis at L4-5 Vertebrae: No acute fracture or focal pathologic process. Paraspinal and other soft tissues: Negative Disc levels: Moderate bilateral L4 neural foraminal stenosis. IMPRESSION: 1. No acute fracture or traumatic subluxation of the thoracic or lumbar spine. 2. Grade 1 anterolisthesis at L4-5 with moderate bilateral neural foraminal stenosis. 3. Aortic Atherosclerosis (ICD10-I70.0). Electronically Signed   By: Ulyses Jarred M.D.   On: 09/13/2021 02:49     Assessment/Plan   1. Intractable low back pain  - Pt with hx of LBP with sciatica on right presents with severe increase in chronic pain after stumbling on 09/05/21  - No s/s of cauda equina syndrome, no fevers  - Check MRI lumbar-spine, continue pain-control    2. Insulin-dependent DM  - A1c was 9.5% in Oct 2021  - Continue CBG checks and insulin    3. CAD - Had 3 DES placed in November 2022   - No anginal complaints  - Not taking statin, reports intolerance  - Continue DAPT    4. Sarcoidosis  - Stable, continue prednisone   5. OSA  - CPAP qHS    DVT prophylaxis: Lovenox  Code Status: Full  Level of Care: Level of care: Med-Surg Family Communication: Wife at bedside  Disposition Plan:  Patient is from: Home  Anticipated d/c is to: TBD Anticipated d/c date is: 09/14/21  Patient currently: Pending MRI lumbar spine, pain-control  Consults called: none  Admission status: Observation     Vianne Bulls, MD [facility]  09/13/2021, 6:07 AM

## 2021-09-13 NOTE — ED Notes (Signed)
Pt stood with walker and got into wheelchair without assistance. Pt stood and got into ED bed with no assistance

## 2021-09-13 NOTE — Progress Notes (Signed)
PT Cancellation Note  Patient Details Name: [name] MRN: [id number] DOB: Feb 22, 1946   Cancelled Treatment:    Reason Eval/Treat Not Completed: Other (comment) . MRI of lumbar spine pending. Will continue efforts to complete PT eval.   [facility] 09/13/2021, 1:50 PM

## 2021-09-13 NOTE — ED Notes (Signed)
Pt is aware of need for urine sample, urinal at bedside 

## 2021-09-13 NOTE — ED Notes (Signed)
Pt able to tolerate ginger ale and was given saltines

## 2021-09-13 NOTE — Progress Notes (Signed)
PROGRESS NOTE  [name] KCL:[id number] DOB: June 27, 1945   PCP: Shirline Frees, MD  Patient is from: Home.  Lives with wife.  Occasionally uses rolling walker  DOA: 09/12/2021 LOS: 0  Chief complaints Chief Complaint  Patient presents with   Back Pain     Brief Narrative / Interim history: 76 year old M with history of CAD/stents, OSA on CPAP, IDDM-2, chronic lumbar pain with sciatica and sarcoidosis on prednisone presenting with acute on chronic lower back pain with sciatica after he stumbled and twisted awkwardly on 09/05/2021.  He was seen by his orthopedic surgeon outpatient and started on muscle relaxer and tramadol without significant improvement.  He had no leg weakness, bowel or bladder habit change.  CT of thoracic and lumbar spine without fracture or traumatic subluxation.   Patient was started on multimodal pain medications and admitted for pain control and further evaluation. MRI lumbar spine ordered.   Subjective: Seen and examined earlier this morning.  No major events overnight of this morning.  Patient was sleepy and briefly wakes to voice and falls back asleep quickly.  He was not able to stay awake to answer my questions.  He follows some commands.  Objective: Vitals:   09/13/21 0522 09/13/21 0631 09/13/21 0940 09/13/21 1235  BP:  122/69 (!) 122/57 (!) 106/54  Pulse:  (!) 108 (!) 110 (!) 108  Resp:  19 18   Temp:  98.1 F (36.7 C) 98.1 F (36.7 C)   TempSrc:  Oral Oral   SpO2:  91% 96%   Weight: 92.4 kg     Height: '5\' 10"'$  (1.778 m)       Examination:  GENERAL: No apparent distress.  Nontoxic. HEENT: MMM.  Vision and hearing grossly intact.  NECK: Supple.  No apparent JVD.  RESP:  No IWOB.  Fair aeration bilaterally. CVS:  RRR. Heart sounds normal.  ABD/GI/GU: BS+. Abd soft, NTND.  MSK/EXT:  Moves extremities. No apparent deformity. No edema.  SKIN: Scar from previous abdominal surgery. NEURO: Sleepy but wakes to voice.  Not able to stay awake for long to  follow my questions or instructions during exam.  Does not appear to have focal neurodeficit but limited exam due to mental status.  PSYCH: Calm. Normal affect.   Procedures:  None  Microbiology summarized: None  Assessment and plan: Principal Problem:   Intractable low back pain Active Problems:   Diabetes mellitus without complication (HCC)   Coronary artery disease with exertional angina (HCC)   OSA (obstructive sleep apnea)   Chronic diastolic heart failure (HCC)   Pulmonary sarcoidosis (HCC)   Hypothyroidism   Acute encephalopathy   Bandemia  Intractable low back pain with sciatica: Reportedly stumbled and twisted on 09/05/2021.  No improvement with muscle relaxer and tramadol outpatient.  No signs and symptoms of cauda equina.  No signs of infection.  Leukocytosis likely demargination from chronic steroid use.  CT spine without acute finding. -Follow MRI lumbar spine -Pain control -PT/OT eval  Acute encephalopathy: Likely toxic from pain medication.  He is also on oxygen, at risk for CO2 retention.  No focal neurodeficit but limited exam.  Low suspicion for infectious process. -Minimize sedating medication -Basic encephalopathy labs if no improvement -Discontinue oxygen.  Use CPAP as needed and at night -OOB/PT/OT -Delirium precaution  Acute respiratory failure with hypoxia: Likely due to sleep apnea and narcotics.  No respiratory symptoms. -Wean off oxygen -Incentive spirometry -CPAP as needed/at bedtime   Uncontrolled IDDM-2 with hyperglycemia: A1c 9.5% in  12/2019 Recent Labs  Lab 09/13/21 0740 09/13/21 1133  GLUCAP 320* 311*  -Resume home 7030 at 30 units twice daily with SSI-sensitive -Recheck A1c -Not on statin due to intolerance.  History of CAD: had 3 DES placed in November 2022.  No anginal symptoms or cardiopulmonary symptoms. -Continue DAPT -Not on statin due to statin intolerance -Hold losartan due to low blood pressure  Essential hypertension:  Slightly low BP this afternoon. -Hold home losartan -Consider stress dose steroid if no improvement   Pulmonary sarcoidosis -Continue home prednisone-May consider stress dose if   OSA on CPAP -CPAP as needed and at bedtime. -Do not use oxygen unless necessary  Hypothyroidism: -Check TSH -Continue home Synthroid  Bandemia-likely demargination from chronic steroid  Body mass index is 29.21 kg/m.          DVT prophylaxis:  enoxaparin (LOVENOX) injection 40 mg Start: 09/13/21 2200  Code Status: Full code Family Communication: Updated patient's wife at bedside Level of care: Med-Surg Status is: Observation The patient will require care spanning > 2 midnights and should be moved to inpatient because: Acute encephalopathy and intractable lower back pain   Final disposition: TBD Consultants:  None  Sch Meds:  Scheduled Meds:  acetaminophen  650 mg Oral Q6H   aspirin EC  81 mg Oral QPM   clopidogrel  75 mg Oral Daily   enoxaparin (LOVENOX) injection  40 mg Subcutaneous Q24H   insulin aspart  0-5 Units Subcutaneous QHS   insulin aspart  0-9 Units Subcutaneous TID WC   insulin aspart protamine- aspart  30 Units Subcutaneous BID WC   levothyroxine  75 mcg Oral Daily   lidocaine  1 patch Transdermal Q24H   pantoprazole  40 mg Oral Daily   predniSONE  10 mg Oral Q breakfast   Continuous Infusions: PRN Meds:.albuterol, HYDROmorphone (DILAUDID) injection, methocarbamol, montelukast, ondansetron (ZOFRAN) IV, mouth rinse, oxyCODONE, polyethylene glycol, senna-docusate  Antimicrobials: Anti-infectives (From admission, onward)    None        I have personally reviewed the following labs and images: CBC: Recent Labs  Lab 09/12/21 1820  WBC 13.7*  NEUTROABS 9.8*  HGB 11.9*  HCT 40.3  MCV 86.9  PLT 292   BMP &GFR Recent Labs  Lab 09/12/21 1820  NA 136  K 4.3  CL 102  CO2 25  GLUCOSE 181*  BUN 18  CREATININE 1.08  CALCIUM 8.8*   Estimated Creatinine  Clearance: 67.5 mL/min (by C-G formula based on SCr of 1.08 mg/dL). Liver & Pancreas: No results for input(s): "AST", "ALT", "ALKPHOS", "BILITOT", "PROT", "ALBUMIN" in the last 168 hours. No results for input(s): "LIPASE", "AMYLASE" in the last 168 hours. No results for input(s): "AMMONIA" in the last 168 hours. Diabetic: No results for input(s): "HGBA1C" in the last 72 hours. Recent Labs  Lab 09/13/21 0740 09/13/21 1133  GLUCAP 320* 311*   Cardiac Enzymes: No results for input(s): "CKTOTAL", "CKMB", "CKMBINDEX", "TROPONINI" in the last 168 hours. No results for input(s): "PROBNP" in the last 8760 hours. Coagulation Profile: No results for input(s): "INR", "PROTIME" in the last 168 hours. Thyroid Function Tests: No results for input(s): "TSH", "T4TOTAL", "FREET4", "T3FREE", "THYROIDAB" in the last 72 hours. Lipid Profile: No results for input(s): "CHOL", "HDL", "LDLCALC", "TRIG", "CHOLHDL", "LDLDIRECT" in the last 72 hours. Anemia Panel: No results for input(s): "VITAMINB12", "FOLATE", "FERRITIN", "TIBC", "IRON", "RETICCTPCT" in the last 72 hours. Urine analysis:    Component Value Date/Time   COLORURINE YELLOW 10/06/2018 [place]  10/06/2018 1324   LABSPEC 1.010 10/06/2018 1324   PHURINE 6.0 10/06/2018 1324   GLUCOSEU >=500 (A) 10/06/2018 1324   HGBUR NEGATIVE 10/06/2018 [place] 10/06/2018 1324   KETONESUR NEGATIVE 10/06/2018 1324   PROTEINUR NEGATIVE 10/06/2018 1324   UROBILINOGEN 0.2 11/09/2010 2146   NITRITE NEGATIVE 10/06/2018 1324   LEUKOCYTESUR NEGATIVE 10/06/2018 1324   Sepsis Labs: Invalid input(s): "PROCALCITONIN", "LACTICIDVEN"  Microbiology: No results found for this or any previous visit (from the past 240 hour(s)).  Radiology Studies: CT Thoracic Spine Wo Contrast  Result Date: 09/13/2021 CLINICAL DATA:  Mid-back pain back pain status post twisting mechanism; back pain status post twisting mechanism. EXAM: CT THORACIC  AND LUMBAR SPINE WITHOUT CONTRAST TECHNIQUE: Multidetector CT imaging of the thoracic and lumbar spine was performed without contrast. Multiplanar CT image reconstructions were also generated. RADIATION DOSE REDUCTION: This exam was performed according to the departmental dose-optimization program which includes automated exposure control, adjustment of the mA and/or kV according to patient size and/or use of iterative reconstruction technique. COMPARISON:  None Available. FINDINGS: CT THORACIC SPINE FINDINGS Alignment: Normal. Vertebrae: No acute fracture or focal pathologic process. Paraspinal and other soft tissues: Calcific aortic atherosclerosis. Disc levels: No spinal canal stenosis. CT LUMBAR SPINE FINDINGS Segmentation: 5 lumbar type vertebrae. Alignment: Grade 1 anterolisthesis at L4-5 Vertebrae: No acute fracture or focal pathologic process. Paraspinal and other soft tissues: Negative Disc levels: Moderate bilateral L4 neural foraminal stenosis. IMPRESSION: 1. No acute fracture or traumatic subluxation of the thoracic or lumbar spine. 2. Grade 1 anterolisthesis at L4-5 with moderate bilateral neural foraminal stenosis. 3. Aortic Atherosclerosis (ICD10-I70.0). Electronically Signed   By: Ulyses Jarred M.D.   On: 09/13/2021 02:49   CT Lumbar Spine Wo Contrast  Result Date: 09/13/2021 CLINICAL DATA:  Mid-back pain back pain status post twisting mechanism; back pain status post twisting mechanism. EXAM: CT THORACIC AND LUMBAR SPINE WITHOUT CONTRAST TECHNIQUE: Multidetector CT imaging of the thoracic and lumbar spine was performed without contrast. Multiplanar CT image reconstructions were also generated. RADIATION DOSE REDUCTION: This exam was performed according to the departmental dose-optimization program which includes automated exposure control, adjustment of the mA and/or kV according to patient size and/or use of iterative reconstruction technique. COMPARISON:  None Available. FINDINGS: CT THORACIC  SPINE FINDINGS Alignment: Normal. Vertebrae: No acute fracture or focal pathologic process. Paraspinal and other soft tissues: Calcific aortic atherosclerosis. Disc levels: No spinal canal stenosis. CT LUMBAR SPINE FINDINGS Segmentation: 5 lumbar type vertebrae. Alignment: Grade 1 anterolisthesis at L4-5 Vertebrae: No acute fracture or focal pathologic process. Paraspinal and other soft tissues: Negative Disc levels: Moderate bilateral L4 neural foraminal stenosis. IMPRESSION: 1. No acute fracture or traumatic subluxation of the thoracic or lumbar spine. 2. Grade 1 anterolisthesis at L4-5 with moderate bilateral neural foraminal stenosis. 3. Aortic Atherosclerosis (ICD10-I70.0). Electronically Signed   By: Ulyses Jarred M.D.   On: 09/13/2021 02:49      Phares Zaccone T. Lajas  If 7PM-7AM, please contact night-coverage [email or web address] 09/13/2021, 2:32 PM

## 2021-09-13 NOTE — ED Notes (Signed)
Pt has known h/o sarcoidosis, does not normally wear oxygen at home, applied in ER for sats that fluctuate between 83 and 88% with good pleth while asleep. 93% while awake.

## 2021-09-14 DIAGNOSIS — T380X5A Adverse effect of glucocorticoids and synthetic analogues, initial encounter: Secondary | ICD-10-CM | POA: Diagnosis present

## 2021-09-14 DIAGNOSIS — M5459 Other low back pain: Secondary | ICD-10-CM | POA: Diagnosis present

## 2021-09-14 DIAGNOSIS — M4316 Spondylolisthesis, lumbar region: Secondary | ICD-10-CM | POA: Diagnosis present

## 2021-09-14 DIAGNOSIS — I25119 Atherosclerotic heart disease of native coronary artery with unspecified angina pectoris: Secondary | ICD-10-CM | POA: Diagnosis not present

## 2021-09-14 DIAGNOSIS — E1151 Type 2 diabetes mellitus with diabetic peripheral angiopathy without gangrene: Secondary | ICD-10-CM | POA: Diagnosis present

## 2021-09-14 DIAGNOSIS — E1122 Type 2 diabetes mellitus with diabetic chronic kidney disease: Secondary | ICD-10-CM | POA: Diagnosis present

## 2021-09-14 DIAGNOSIS — N179 Acute kidney failure, unspecified: Secondary | ICD-10-CM

## 2021-09-14 DIAGNOSIS — G4733 Obstructive sleep apnea (adult) (pediatric): Secondary | ICD-10-CM | POA: Diagnosis present

## 2021-09-14 DIAGNOSIS — M48061 Spinal stenosis, lumbar region without neurogenic claudication: Secondary | ICD-10-CM | POA: Diagnosis present

## 2021-09-14 DIAGNOSIS — I130 Hypertensive heart and chronic kidney disease with heart failure and stage 1 through stage 4 chronic kidney disease, or unspecified chronic kidney disease: Secondary | ICD-10-CM | POA: Diagnosis present

## 2021-09-14 DIAGNOSIS — D509 Iron deficiency anemia, unspecified: Secondary | ICD-10-CM

## 2021-09-14 DIAGNOSIS — G9340 Encephalopathy, unspecified: Secondary | ICD-10-CM | POA: Diagnosis not present

## 2021-09-14 DIAGNOSIS — X501XXA Overexertion from prolonged static or awkward postures, initial encounter: Secondary | ICD-10-CM | POA: Diagnosis not present

## 2021-09-14 DIAGNOSIS — Y929 Unspecified place or not applicable: Secondary | ICD-10-CM | POA: Diagnosis not present

## 2021-09-14 DIAGNOSIS — M5416 Radiculopathy, lumbar region: Secondary | ICD-10-CM | POA: Diagnosis present

## 2021-09-14 DIAGNOSIS — D860 Sarcoidosis of lung: Secondary | ICD-10-CM | POA: Diagnosis present

## 2021-09-14 DIAGNOSIS — E2740 Unspecified adrenocortical insufficiency: Secondary | ICD-10-CM | POA: Diagnosis present

## 2021-09-14 DIAGNOSIS — D72825 Bandemia: Secondary | ICD-10-CM | POA: Diagnosis not present

## 2021-09-14 DIAGNOSIS — R7989 Other specified abnormal findings of blood chemistry: Secondary | ICD-10-CM

## 2021-09-14 DIAGNOSIS — I5032 Chronic diastolic (congestive) heart failure: Secondary | ICD-10-CM | POA: Diagnosis present

## 2021-09-14 DIAGNOSIS — E860 Dehydration: Secondary | ICD-10-CM | POA: Diagnosis present

## 2021-09-14 DIAGNOSIS — G8929 Other chronic pain: Secondary | ICD-10-CM | POA: Diagnosis present

## 2021-09-14 DIAGNOSIS — E039 Hypothyroidism, unspecified: Secondary | ICD-10-CM | POA: Diagnosis present

## 2021-09-14 DIAGNOSIS — I25118 Atherosclerotic heart disease of native coronary artery with other forms of angina pectoris: Secondary | ICD-10-CM | POA: Diagnosis not present

## 2021-09-14 DIAGNOSIS — I959 Hypotension, unspecified: Secondary | ICD-10-CM | POA: Diagnosis not present

## 2021-09-14 DIAGNOSIS — J9601 Acute respiratory failure with hypoxia: Secondary | ICD-10-CM | POA: Diagnosis not present

## 2021-09-14 DIAGNOSIS — M5431 Sciatica, right side: Secondary | ICD-10-CM | POA: Diagnosis present

## 2021-09-14 DIAGNOSIS — E663 Overweight: Secondary | ICD-10-CM | POA: Diagnosis present

## 2021-09-14 DIAGNOSIS — E1165 Type 2 diabetes mellitus with hyperglycemia: Secondary | ICD-10-CM | POA: Diagnosis present

## 2021-09-14 DIAGNOSIS — G928 Other toxic encephalopathy: Secondary | ICD-10-CM | POA: Diagnosis not present

## 2021-09-14 DIAGNOSIS — E119 Type 2 diabetes mellitus without complications: Secondary | ICD-10-CM | POA: Diagnosis not present

## 2021-09-14 DIAGNOSIS — N183 Chronic kidney disease, stage 3 unspecified: Secondary | ICD-10-CM | POA: Diagnosis not present

## 2021-09-14 LAB — RETICULOCYTES
Immature Retic Fract: 30.1 % — ABNORMAL HIGH (ref 2.3–15.9)
RBC.: 4.4 MIL/uL (ref 4.22–5.81)
Retic Count, Absolute: 129.8 10*3/uL (ref 19.0–186.0)
Retic Ct Pct: 3 % (ref 0.4–3.1)

## 2021-09-14 LAB — CBC
HCT: 39.2 % (ref 39.0–52.0)
Hemoglobin: 11 g/dL — ABNORMAL LOW (ref 13.0–17.0)
MCH: 24.7 pg — ABNORMAL LOW (ref 26.0–34.0)
MCHC: 28.1 g/dL — ABNORMAL LOW (ref 30.0–36.0)
MCV: 87.9 fL (ref 80.0–100.0)
Platelets: 174 10*3/uL (ref 150–400)
RBC: 4.46 MIL/uL (ref 4.22–5.81)
RDW: 18 % — ABNORMAL HIGH (ref 11.5–15.5)
WBC: 19.3 10*3/uL — ABNORMAL HIGH (ref 4.0–10.5)
nRBC: 0 % (ref 0.0–0.2)

## 2021-09-14 LAB — GLUCOSE, CAPILLARY
Glucose-Capillary: 341 mg/dL — ABNORMAL HIGH (ref 70–99)
Glucose-Capillary: 421 mg/dL — ABNORMAL HIGH (ref 70–99)
Glucose-Capillary: 280 mg/dL — ABNORMAL HIGH (ref 70–99)
Glucose-Capillary: 246 mg/dL — ABNORMAL HIGH (ref 70–99)

## 2021-09-14 LAB — RENAL FUNCTION PANEL
Albumin: 3 g/dL — ABNORMAL LOW (ref 3.5–5.0)
Anion gap: 11 (ref 5–15)
BUN: 39 mg/dL — ABNORMAL HIGH (ref 8–23)
CO2: 27 mmol/L (ref 22–32)
Calcium: 8.3 mg/dL — ABNORMAL LOW (ref 8.9–10.3)
Chloride: 98 mmol/L (ref 98–111)
Creatinine, Ser: 2.39 mg/dL — ABNORMAL HIGH (ref 0.61–1.24)
GFR, Estimated: 28 mL/min — ABNORMAL LOW (ref >60)
Glucose, Bld: 278 mg/dL — ABNORMAL HIGH (ref 70–99)
Phosphorus: 4.1 mg/dL (ref 2.5–4.6)
Potassium: 5 mmol/L (ref 3.5–5.1)
Sodium: 136 mmol/L (ref 135–145)

## 2021-09-14 LAB — VITAMIN B12: Vitamin B-12: 727 pg/mL (ref 180–914)

## 2021-09-14 LAB — IRON AND TIBC
Iron: 15 ug/dL — ABNORMAL LOW (ref 45–182)
Saturation Ratios: 5 % — ABNORMAL LOW (ref 17.9–39.5)
TIBC: 294 ug/dL (ref 250–450)
UIBC: 279 ug/dL

## 2021-09-14 LAB — FERRITIN: Ferritin: 28 ng/mL (ref 24–336)

## 2021-09-14 LAB — AMMONIA: Ammonia: 24 umol/L (ref 9–35)

## 2021-09-14 LAB — MAGNESIUM: Magnesium: 2.1 mg/dL (ref 1.7–2.4)

## 2021-09-14 LAB — HEMOGLOBIN A1C
Hgb A1c MFr Bld: 11.9 % — ABNORMAL HIGH (ref 4.8–5.6)
Mean Plasma Glucose: 294.83 mg/dL

## 2021-09-14 LAB — FOLATE: Folate: 6.3 ng/mL (ref >5.9)

## 2021-09-14 LAB — GLUCOSE, RANDOM: Glucose, Bld: 392 mg/dL — ABNORMAL HIGH (ref 70–99)

## 2021-09-14 LAB — TSH: TSH: 0.829 u[IU]/mL (ref 0.350–4.500)

## 2021-09-14 MED ORDER — INSULIN ASPART 100 UNIT/ML IJ SOLN
0.0000 [IU] | Freq: Three times a day (TID) | INTRAMUSCULAR | Status: DC
  Administered 2021-09-14: 20 [IU] via SUBCUTANEOUS
  Administered 2021-09-14: 11 [IU] via SUBCUTANEOUS
  Administered 2021-09-15 (×3): 20 [IU] via SUBCUTANEOUS
  Administered 2021-09-16: 15 [IU] via SUBCUTANEOUS
  Administered 2021-09-16: 11 [IU] via SUBCUTANEOUS

## 2021-09-14 MED ORDER — INSULIN GLARGINE-YFGN 100 UNIT/ML ~~LOC~~ SOLN
30.0000 [IU] | Freq: Two times a day (BID) | SUBCUTANEOUS | Status: DC
  Administered 2021-09-14: 30 [IU] via SUBCUTANEOUS
  Filled 2021-09-14 (×2): qty 0.3

## 2021-09-14 MED ORDER — PREDNISONE 5 MG PO TABS
50.0000 mg | ORAL_TABLET | Freq: Every day | ORAL | Status: DC
  Administered 2021-09-14 – 2021-09-16 (×3): 50 mg via ORAL
  Filled 2021-09-14 (×4): qty 2

## 2021-09-14 MED ORDER — INSULIN ASPART 100 UNIT/ML IJ SOLN
8.0000 [IU] | Freq: Three times a day (TID) | INTRAMUSCULAR | Status: DC
  Administered 2021-09-14 – 2021-09-15 (×2): 8 [IU] via SUBCUTANEOUS

## 2021-09-14 MED ORDER — SODIUM CHLORIDE 0.9 % IV SOLN
250.0000 mg | Freq: Every day | INTRAVENOUS | Status: AC
  Administered 2021-09-14 – 2021-09-15 (×2): 250 mg via INTRAVENOUS
  Filled 2021-09-14: qty 20
  Filled 2021-09-14: qty 15

## 2021-09-14 MED ORDER — SODIUM CHLORIDE 0.9 % IV SOLN: INTRAVENOUS | Status: DC

## 2021-09-14 MED ORDER — POTASSIUM CHLORIDE IN NACL 20-0.9 MEQ/L-% IV SOLN
INTRAVENOUS | Status: DC
  Filled 2021-09-14: qty 1000

## 2021-09-14 MED ORDER — INSULIN ASPART 100 UNIT/ML IJ SOLN
0.0000 [IU] | Freq: Every day | INTRAMUSCULAR | Status: DC
  Administered 2021-09-14: 2 [IU] via SUBCUTANEOUS
  Administered 2021-09-15: 5 [IU] via SUBCUTANEOUS

## 2021-09-14 NOTE — Plan of Care (Signed)
  Problem: Coping: Goal: Level of anxiety will decrease Outcome: Progressing   Problem: Pain Managment: Goal: General experience of comfort will improve Outcome: Progressing   

## 2021-09-14 NOTE — Inpatient Diabetes Management (Signed)
Inpatient Diabetes Program Recommendations  AACE/ADA: New Consensus Statement on Inpatient Glycemic Control (2015)  Target Ranges:  Prepandial:   less than 140 mg/dL      Peak postprandial:   less than 180 mg/dL (1-2 hours)      Critically ill patients:  140 - 180 mg/dL   Lab Results  Component Value Date   GLUCAP 341 (H) 09/14/2021   HGBA1C 9.5 (H) 01/01/2020    Review of Glycemic Control  Latest Reference Range & Units 09/13/21 17:02 09/13/21 21:40 09/14/21 07:32  Glucose-Capillary 70 - 99 mg/dL 232 (H) 227 (H) 341 (H)  (H): Data is abnormally high Diabetes history: Type 2 DM Outpatient Diabetes medications: Glipizide 10 mg (NT), Novolog 70/30 30 units BID Current orders for Inpatient glycemic control: Novolog 70/30 30 units BID, Novolog 0-9 units TID & HS Prednisone 10 mg QD (discontinued) Solumedrol 40 mg QD  Inpatient Diabetes Program Recommendations:    A1C in process, following.   AM dose on 7/10 of Novolog 70/30 missed due to order change PM dose of 70/30 given although patient did not eat elevated this Am due to patient eating at 0400.  Solumedrol added  Renal function significantly increased from 7/9.   Given multiple variables consider converting and titrate as appropriate: -Levemir 24 units BID -Novolog 10 units TID (assuming patient is consuming >50% of meals) -Novolog 0-9 units TID & HS -Carb modified diet  Thanks, Bronson Curb, MSN, RNC-OB Diabetes Coordinator [phone] (8a-5p)

## 2021-09-14 NOTE — Evaluation (Signed)
Physical Therapy Evaluation Patient Details Name: [name] MRN: [id number] DOB: 12-19-1945 Today's Date: 09/14/2021  History of Present Illness  76 year old male admitted on 09/12/21 for pain control and further evaluation after he stumbled on 09/05/2021, twisting awkwardly, and experienced immediate increase in low back pain with radiation down his right leg. Despite beginning pain and muscle relaxers after going to an orthopedic clinic, he has been unable to get out of bed due to the severe pain with any movement. Past medical history significant for CAD with stents, OSA, insulin-dependent diabetes mellitus, sarcoidosis on prednisone, and chronic low back pain with sciatica  Clinical Impression  Pt admitted with above diagnosis.  Pt currently with functional limitations due to the deficits listed below (see PT Problem List). Pt will benefit from skilled PT to increase their independence and safety with mobility to allow discharge to the venue listed below.  Pt assisted with ambulating however only tolerated short distance in room.  Pt taking rest break after each transition and reports pain more tolerable today then previous days.  Pt would benefit from HHPT upon d/c.         Recommendations for follow up therapy are one component of a multi-disciplinary discharge planning process, led by the attending physician.  Recommendations may be updated based on patient status, additional functional criteria and insurance authorization.  Follow Up Recommendations Home health PT      Assistance Recommended at Discharge PRN  Patient can return home with the following  A little help with bathing/dressing/bathroom;A little help with walking and/or transfers;Help with stairs or ramp for entrance    Equipment Recommendations None recommended by PT  Recommendations for Other Services       Functional Status Assessment Patient has had a recent decline in their functional status and demonstrates the ability  to make significant improvements in function in a reasonable and predictable amount of time.     Precautions / Restrictions Precautions Precautions: Fall;Back Precaution Comments: Utilizing back precautions for pain management      Mobility  Bed Mobility Overal bed mobility: Needs Assistance Bed Mobility: Supine to Sit, Sit to Supine     Supine to sit: Min guard Sit to supine: Min guard   General bed mobility comments: pt cued for log roll technique yet he peforms with legs over EOB first then sitting upright; pt states the way he performs causes less pain    Transfers Overall transfer level: Needs assistance Equipment used: Rolling walker (2 wheels) Transfers: Sit to/from Stand Sit to Stand: Min guard           General transfer comment: cues for hand placement    Ambulation/Gait Ambulation/Gait assistance: Min guard Gait Distance (Feet): 10 Feet Assistive device: Rolling walker (2 wheels) Gait Pattern/deviations: Step-through pattern, Decreased stride length, Trunk flexed       General Gait Details: pt with small steps and ambulated in small circle in room, declined any further distance due to fear of increased pain (states today is the first day he has felt better)  Stairs            Wheelchair Mobility    Modified Rankin (Stroke Patients Only)       Balance Overall balance assessment: History of Falls, Needs assistance         Standing balance support: During functional activity, Reliant on assistive device for balance, Bilateral upper extremity supported Standing balance-Leahy Scale: Poor  Pertinent Vitals/Pain Pain Assessment Pain Assessment: 0-10 Pain Score: 4  Pain Location: back with mobilizing Pain Descriptors / Indicators: Sharp, Grimacing, Guarding Pain Intervention(s): Repositioned, Monitored during session, Premedicated before session    Home Living Family/patient expects to be  discharged to:: Private residence Living Arrangements: Spouse/significant other Available Help at Discharge: Available 24 hours/day Type of Home: House Home Access: Stairs to enter;Ramped entrance Entrance Stairs-Rails: Can reach both Entrance Stairs-Number of Steps: 6.  Pt uses stairs but has ramp with rails to 3rd entrace to house if needed.   Home Layout: One level Home Equipment: Conservation officer, nature (2 wheels);Rollator (4 wheels);Electric scooter;Wheelchair - manual;BSC/3in1;Grab bars - toilet;Grab bars - tub/shower;Hand held shower head;Shower seat - built in;Shower seat Additional Comments: Hearing Aids. 2 scooters: one for inside and one for car with lift.    Prior Function Prior Level of Function : Independent/Modified Independent;Driving;Needs assist;History of Falls (last six months)       Physical Assist : ADLs (physical)   ADLs (physical): IADLs Mobility Comments: Uses rollator "90% of the time and scooter the other 10%. ADLs Comments: Wife performs all cooking/cleaning/laundry at baseline.     Hand Dominance   Dominant Hand: Right    Extremity/Trunk Assessment        Lower Extremity Assessment Lower Extremity Assessment: Generalized weakness       Communication   Communication: HOH  Cognition Arousal/Alertness: Awake/alert Behavior During Therapy: WFL for tasks assessed/performed Overall Cognitive Status: Within Functional Limits for tasks assessed                                          General Comments      Exercises     Assessment/Plan    PT Assessment Patient needs continued PT services  PT Problem List Decreased mobility;Pain;Decreased strength       PT Treatment Interventions DME instruction;Gait training;Therapeutic exercise;Balance training;Functional mobility training;Therapeutic activities;Patient/family education    PT Goals (Current goals can be found in the Care Plan section)  Acute Rehab PT Goals PT Goal  Formulation: With patient Time For Goal Achievement: 09/21/21 Potential to Achieve Goals: Good    Frequency Min 3X/week     Co-evaluation               AM-PAC PT "6 Clicks" Mobility  Outcome Measure Help needed turning from your back to your side while in a flat bed without using bedrails?: A Little Help needed moving from lying on your back to sitting on the side of a flat bed without using bedrails?: A Little Help needed moving to and from a bed to a chair (including a wheelchair)?: A Little Help needed standing up from a chair using your arms (e.g., wheelchair or bedside chair)?: A Little Help needed to walk in hospital room?: A Little Help needed climbing 3-5 steps with a railing? : A Lot 6 Click Score: 17    End of Session Equipment Utilized During Treatment: Gait belt Activity Tolerance: Patient tolerated treatment well Patient left: in bed;with call bell/phone within reach;with bed alarm set;with family/visitor present   PT Visit Diagnosis: Difficulty in walking, not elsewhere classified (R26.2)    Time: 9798-9211 PT Time Calculation (min) (ACUTE ONLY): 25 min   Charges:   PT Evaluation $PT Eval Low Complexity: 1 Low        Kati PT, DPT Physical Therapist Acute Rehabilitation Services Preferred contact method: Secure  Chat Weekend Pager Only: [phone] Office: [place] 09/14/2021, 2:44 PM

## 2021-09-14 NOTE — TOC Initial Note (Signed)
Transition of Care [facility]) - Initial/Assessment Note   Patient Details  Name: [name] MRN: [id number] Date of Birth: 1946/01/23  Transition of Care [facility]) CM/SW Contact:    Sherie Don, LCSW Phone Number: 09/14/2021, 3:20 PM  Clinical Narrative: PT and OT evaluations recommended HH. Patient is agreeable to referral and requested an agency other than [facility]. CSW made [facility] referral to [facility] with [facility], which was accepted. HH orders have been placed. CSW updated patient.                 Expected Discharge Plan: [place] Barriers to Discharge: Continued Medical Work up  Patient Goals and CMS Choice Patient states their goals for this hospitalization and ongoing recovery are:: Return home with [facility] CMS Medicare.gov Compare Post Acute Care list provided to:: Patient Choice offered to / list presented to : Patient  Expected Discharge Plan and Services Expected Discharge Plan: [place] In-house Referral: Clinical Social Work Post Acute Care Choice: [place] arrangements for the past 2 months: Single Family Home           DME Arranged: N/A DME Agency: NA HH Arranged: PT, OT HH Agency: [facility] [place] Date [place]: 09/14/21 Time [place]: [place] Representative spoke with at [place]: Delsa Sale  Prior Living Arrangements/Services Living arrangements for the past 2 months: [place] Lives with:: Spouse Patient language and need for interpreter reviewed:: Yes Do you feel safe going back to the place where you live?: Yes      Need for Family Participation in Patient Care: No (Comment) Care giver support system in place?: Yes (comment) Criminal Activity/Legal Involvement Pertinent to Current Situation/Hospitalization: No - Comment as needed  Activities of Daily Living Home Assistive Devices/Equipment: Eyeglasses, Dentures (specify type), Walker (specify type) ADL Screening (condition at time of admission) Patient's  cognitive ability adequate to safely complete daily activities?: Yes Is the patient deaf or have difficulty hearing?: Yes Does the patient have difficulty seeing, even when wearing glasses/contacts?: No Does the patient have difficulty concentrating, remembering, or making decisions?: No Patient able to express need for assistance with ADLs?: Yes Does the patient have difficulty dressing or bathing?: No Independently performs ADLs?: Yes (appropriate for developmental age) Does the patient have difficulty walking or climbing stairs?: No Weakness of Legs: None Weakness of Arms/Hands: None  Permission Sought/Granted Permission sought to share information with : Other (comment) Permission granted to share information with : Yes, Verbal Permission Granted Permission granted to share info w AGENCY: [place] agencies  Emotional Assessment Affect (typically observed): Accepting, Appropriate Orientation: : Oriented to Self, Oriented to Place, Oriented to  Time, Oriented to Situation Alcohol / Substance Use: Not Applicable Psych Involvement: No (comment)  Admission diagnosis:  Intractable low back pain [M54.59] Acute right-sided low back pain with right-sided sciatica [M54.41] Lumbar radiculopathy, acute [M54.16] Patient Active Problem List   Diagnosis Date Noted   Lumbar radiculopathy, acute 09/14/2021   Azotemia 09/14/2021   Intractable low back pain 09/13/2021   Acute encephalopathy 09/13/2021   Bandemia 09/13/2021   Acute respiratory failure with hypoxia ([place]) 09/13/2021   Shortness of breath 12/25/2020   Gastric polyps    GI bleed 01/01/2020   Osteomyelitis ([place]) 01/01/2020   Charcot's joint, left ankle and foot 11/06/2019   Peripheral arterial disease ([place]) 07/12/2019   Pain in right elbow 10/30/2018   AKI (acute kidney injury) ([place]) 10/03/2018   Hypoalbuminemia 10/03/2018   Hypokalemia  10/03/2018   Hypothyroidism 10/03/2018   Iron deficiency anemia    Normocytic anemia 06/29/2016    Chronic diastolic heart failure ([place]) 01/14/2014   Stable angina ([place]) 01/14/2014   Obesity 01/14/2014   Pulmonary sarcoidosis ([place]) 01/14/2014   Uncontrolled diabetes mellitus 01/14/2014   Hyperlipidemia 01/14/2014   Coronary atherosclerosis of native coronary artery    Sinusitis, chronic 05/30/2011   OSA (obstructive sleep apnea) 10/09/2008   ADRENAL INSUFFICIENCY, HX OF 02/21/2008   Coronary artery disease with exertional angina ([place]) 11/20/2007   Sarcoidosis 02/20/2007   Diabetes mellitus without complication ([place]) 76/72/0947   GERD 12/20/2006   PCP:  Shirline Frees, MD Pharmacy:   [id number] [place] [place] [id number]: [phone]: [phone] [facility] - [address] [place]Phone: [phone]: [phone] Readmission Risk Interventions     No data to display

## 2021-09-14 NOTE — Evaluation (Signed)
Occupational Therapy Evaluation Patient Details Name: [name] MRN: [id number] DOB: Jul 08, 1945 Today's Date: 09/14/2021   History of Present Illness 76 year old male admitted on 09/12/21 for pain control and further evaluation after he stumbled on 09/05/2021, twisting awkwardly, and experienced immediate increase in low back pain with radiation down his right leg. Despite beginning pain and muscle relaxers after going to an orthopedic clinic, he has been unable to get out of bed due to the severe pain with any movement. Past medical history significant for CAD with stents, OSA, insulin-dependent diabetes mellitus, sarcoidosis on prednisone, and chronic low back pain with sciatica   Clinical Impression   Patient is currently requiring assistance with ADLs including minimal assist with Lower body ADLs, setup assist with seated Upper body ADLs,  as well as  Min-light moderate assist with bed mobility and minimal assist with functional transfers to bedside commode with pivot and RW.  Current level of function is below patient's typical baseline.  During this evaluation, patient was limited by generalized weakness, impaired activity tolerance, and back pain although pt reports pain is much better today and pt was premedicated prior to OT arrival.  Pt also emotional at times re: pt described unsatisfactory experience in ED and required frequent redirection to task at hand.  Current limitations have potential to impact patient's safety and independence during functional mobility, as well as performance for ADLs.  Patient lives his spouse who is able to provide 24/7 supervision and assistance.  Patient demonstrates good rehab potential, and should benefit from continued skilled occupational therapy services while in acute care to maximize safety, independence and quality of life at home.  Continued occupational therapy services in the home is recommended.  ?     Recommendations for follow up therapy are one  component of a multi-disciplinary discharge planning process, led by the attending physician.  Recommendations may be updated based on patient status, additional functional criteria and insurance authorization.   Follow Up Recommendations  Home health OT (Pt reports plan to purchase a lift recliner to eliminate need for spouse to assist with bed mobility.)    Assistance Recommended at Discharge Frequent or constant Supervision/Assistance  Patient can return home with the following A little help with walking and/or transfers;A little help with bathing/dressing/bathroom;Help with stairs or ramp for entrance;Assistance with cooking/housework;Assist for transportation    Functional Status Assessment  Patient has had a recent decline in their functional status and demonstrates the ability to make significant improvements in function in a reasonable and predictable amount of time.  Equipment Recommendations  Other (comment) (Long bath brush/sponge.)    Recommendations for Other Services PT consult     Precautions / Restrictions Precautions Precautions: Fall Precaution Comments: Utilizing back precautions for pain management Restrictions Weight Bearing Restrictions: No      Mobility Bed Mobility Overal bed mobility: Needs Assistance Bed Mobility: Sidelying to Sit, Rolling Rolling: Min assist Sidelying to sit: Min assist       General bed mobility comments: Pt log rolled to RT side with Min As and required Minimal assist at trunk to push upright, cues for sequencing    Transfers                          Balance Overall balance assessment: History of Falls, Needs assistance Sitting-balance support: Single extremity supported, Feet supported Sitting balance-Leahy Scale: Good     Standing balance support: During functional activity, Reliant on assistive device for  balance, Bilateral upper extremity supported Standing balance-Leahy Scale: Poor                              ADL either performed or assessed with clinical judgement   ADL Overall ADL's : Needs assistance/impaired Eating/Feeding: Independent   Grooming: Wash/dry face;Sitting;Set up   Upper Body Bathing: Sitting;Set up   Lower Body Bathing: Minimal assistance;Sitting/lateral leans;Sit to/from stand   Upper Body Dressing : Set up;Sitting   Lower Body Dressing: Minimal assistance Lower Body Dressing Details (indicate cue type and reason): Pt able to demonstrate modified figure 4 to each LE to demonstrate socks/shoes. Pt/wife educated on use of reacher for LE dressing and verbalized understanding. Toilet Transfer: Minimal assistance;Rolling walker (2 wheels);Stand-pivot Armed forces technical officer Details (indicate cue type and reason): Pt stood from elevated EOB with Min As to RW. Cues for hands and to avoid spinal flexion. Pt used RW to pivot to RW with Min As and cues for safety as pt became anxious once back pain reached 4/10. Min As to lower to chair due to decreased eccentric control Toileting- Clothing Manipulation and Hygiene: Minimal assistance       Functional mobility during ADLs: Minimal assistance;Rolling walker (2 wheels)       Vision Baseline Vision/History: 1 Wears glasses (just readers) Ability to See in Adequate Light: 0 Adequate       Perception Perception Perception: Within Functional Limits   Praxis Praxis Praxis: Intact    Pertinent Vitals/Pain Pain Assessment Pain Assessment: 0-10 Pain Score: 4  Pain Location: 4/10 highest when mobilizing to chair. 0/10 at rest. 2/10 at EOB. Pain Intervention(s): Limited activity within patient's tolerance, Premedicated before session, Repositioned, Relaxation, Utilized relaxation techniques, Monitored during session     Hand Dominance Right   Extremity/Trunk Assessment Upper Extremity Assessment Upper Extremity Assessment: Generalized weakness   Lower Extremity Assessment Lower Extremity Assessment: Defer to PT  evaluation       Communication Communication Communication: HOH   Cognition Arousal/Alertness: Awake/alert Behavior During Therapy: WFL for tasks assessed/performed Overall Cognitive Status: Within Functional Limits for tasks assessed                                 General Comments: Tearful when speaking about his ED experience. Pt asked to speak with Charge Nurse to lodge complaint. Pt's RN notified. Pt required frequent redirection to task at hand due to upset from ED.     General Comments       Exercises     Shoulder Instructions      Home Living Family/patient expects to be discharged to:: Private residence Living Arrangements: Spouse/significant other Available Help at Discharge: Available 24 hours/day Type of Home: House Home Access: Stairs to enter;Ramped entrance Entrance Stairs-Number of Steps: 6.  Pt uses stairs but has ramp with rails to 3rd entrace to house if needed. Entrance Stairs-Rails: Can reach both Home Layout: One level     Bathroom Shower/Tub: Occupational psychologist: Handicapped height     Home Equipment: Conservation officer, nature (2 wheels);Rollator (4 wheels);Electric scooter;Wheelchair - manual;BSC/3in1;Grab bars - toilet;Grab bars - tub/shower;Hand held shower head;Shower seat - built in;Shower seat;Adaptive equipment Adaptive Equipment: Reacher Additional Comments: Hearing Aids. t has 2 scooters: one for inside and one for car with lift.      Prior Functioning/Environment Prior Level of Function : Independent/Modified Independent;Driving;Needs assist;History of Falls (last  six months)       Physical Assist : ADLs (physical)   ADLs (physical): IADLs Mobility Comments: Uses rollator "90% of the time and scooter the other 10%. ADLs Comments: Wife performs all cooking/cleaning/laundry at baseline.        OT Problem List: Pain;Decreased activity tolerance;Decreased knowledge of use of DME or AE;Impaired balance (sitting  and/or standing);Decreased knowledge of precautions      OT Treatment/Interventions: Self-care/ADL training;Therapeutic activities;Energy conservation;Patient/family education;DME and/or AE instruction;Balance training    OT Goals(Current goals can be found in the care plan section) Acute Rehab OT Goals Patient Stated Goal: be able to move without wife straining to assist. OT Goal Formulation: With patient/family Time For Goal Achievement: 09/28/21 Potential to Achieve Goals: Good ADL Goals Pt Will Perform Grooming: standing;with supervision Pt Will Perform Lower Body Bathing: with supervision;sitting/lateral leans;sit to/from stand;with adaptive equipment Pt Will Perform Lower Body Dressing: with adaptive equipment;sitting/lateral leans;sit to/from stand;with set-up;with supervision Pt Will Transfer to Toilet: ambulating;with supervision Pt Will Perform Toileting - Clothing Manipulation and hygiene: with modified independence;sitting/lateral leans;sit to/from stand Additional ADL Goal #1: Pt will demonstrate proper body mechanics/back precautions during ADLs and functional mobility to prevent exacerbation of symptoms and rehospitalization.  OT Frequency: Min 2X/week    Co-evaluation              AM-PAC OT "6 Clicks" Daily Activity     Outcome Measure Help from another person eating meals?: None Help from another person taking care of personal grooming?: A Little Help from another person toileting, which includes using toliet, bedpan, or urinal?: A Little Help from another person bathing (including washing, rinsing, drying)?: A Little Help from another person to put on and taking off regular upper body clothing?: A Little Help from another person to put on and taking off regular lower body clothing?: A Little 6 Click Score: 19   End of Session Equipment Utilized During Treatment: Gait belt;Rolling walker (2 wheels) Nurse Communication: Other (comment) (Pt request to speak with  charge nurse re: ED experience.)  Activity Tolerance: Patient limited by pain Patient left: in chair;with call bell/phone within reach;with chair alarm set;with family/visitor present  OT Visit Diagnosis: History of falling (Z91.81);Muscle weakness (generalized) (M62.81);Pain;Unsteadiness on feet (R26.81) Pain - part of body:  (Back)                Time: 1610-9604 OT Time Calculation (min): 40 min Charges:  OT General Charges $OT Visit: 1 Visit OT Evaluation $OT Eval Low Complexity: 1 Low OT Treatments $Self Care/Home Management : 8-22 mins $Therapeutic Activity: 8-22 mins  Anderson [place], OT Acute Rehab Services Office: [phone] 09/14/2021  Julien Girt 09/14/2021, 10:11 AM

## 2021-09-14 NOTE — Progress Notes (Signed)
PROGRESS NOTE  [name] RCV:[id number] DOB: [birth year]   PCP: Shirline Frees, MD  Patient is from: Home.  Lives with wife.  Occasionally uses rolling walker  DOA: 09/12/2021 LOS: 0  Chief complaints Chief Complaint  Patient presents with   Back Pain     Brief Narrative / Interim history: 76 year old M with history of CAD/stents, OSA on CPAP, IDDM-2, chronic lumbar pain with sciatica and sarcoidosis on prednisone presenting with acute on chronic lower back pain with sciatica after he stumbled and twisted awkwardly on 09/05/2021.  He was seen by his orthopedic surgeon outpatient and started on muscle relaxer and tramadol without significant improvement.  He had no leg weakness, bowel or bladder habit change.  CT of thoracic and lumbar spine without fracture or traumatic subluxation.   Patient was started on multimodal pain medications and admitted for pain control and further evaluation. MRI lumbar spine ordered.   MRI lumbar spine showed anterolisthesis, disc bulge and right foraminal disc extrusion at L4-5 resulting in mild to moderate spinal canal stenosis and severe bilateral neural foraminal narrowing, greater on the right side, with impingement of the bilateral L4 nerve roots.  Neurosurgery consulted and recommended steroid taper and outpatient follow-up for surgical evaluation.   Hospital course complicated by encephalopathy, hypotension and AKI.  Started on IV Solu-Medrol and IV fluid.  Subjective: Seen and examined earlier this morning.  No major events overnight of this morning.  He was hypotensive yesterday.  Hypotension resolved after IV fluid and IV Solu-Medrol.  Continues to endorse pain radiating from his right lower back down right leg.  Describes the pain as someone hammering a nail in his right hip.  Reports tingling sensation down his right leg along the front.  Weakness in his right leg  Objective: Vitals:   09/13/21 2137 09/14/21 0215 09/14/21 0945 09/14/21 1328  BP:   (!) 119/56 129/62 (!) 115/57  Pulse:  72 (!) 101 91  Resp: '16 18 17 16  '$ Temp:  98 F (36.7 C) 98.3 F (36.8 C) 98.4 F (36.9 C)  TempSrc:  Oral Oral Oral  SpO2:  100% (!) 89% 90%  Weight:      Height:        Examination:  GENERAL: No apparent distress.  Nontoxic. HEENT: MMM.  Vision and hearing grossly intact.  NECK: Supple.  No apparent JVD.  RESP:  No IWOB.  Fair aeration bilaterally. CVS:  RRR. Heart sounds normal.  ABD/GI/GU: BS+. Abd soft, NTND.  MSK/EXT:  Moves extremities but weaker in right leg.. No apparent deformity. No edema.  SKIN: no apparent skin lesion or wound NEURO: Awake and alert. Oriented appropriately.  Motor 3/5 with hip flexion in the right and 4/5 in the left.  Light sensation intact.  Patellar reflex symmetric. PSYCH: Calm. Normal affect.   Procedures:  None  Microbiology summarized: None  Assessment and plan: Principal Problem:   Intractable low back pain Active Problems:   Diabetes mellitus without complication (HCC)   Coronary artery disease with exertional angina (HCC)   OSA (obstructive sleep apnea)   Chronic diastolic heart failure (HCC)   Pulmonary sarcoidosis (HCC)   Normocytic anemia   Hypothyroidism   Acute encephalopathy   Bandemia   Acute respiratory failure with hypoxia (HCC)   Lumbar radiculopathy, acute  Lumbar radiculopathy: Reportedly stumbled and twisted on 09/05/2021.  Evaluated by his orthopedic surgery at [place].  No improvement with muscle relaxer and tramadol outpatient.  Has RLE weakness and tingling.  No  signs and symptoms of cauda equina.  CT spine without acute finding.  MRI lumbar spine with anterolisthesis, disc bulge and right foraminal disc extrusion at L4-5 resulting in mild to moderate spinal canal stenosis and severe bilateral neural foraminal narrowing, greater on the right side, with impingement of the bilateral L4 nerve roots.  -Neurosurgery, Dr. Venetia Constable recommends steroid taper and outpatient  follow-up for surgical evaluation -Continue Tylenol, Robaxin, oxycodone and IV Dilaudid based on pain severity -Already on IV Solu-Medrol for possible adrenal insufficiency -Added gabapentin -PT/OT  Acute encephalopathy: Likely toxic from pain medication.  Resolved. -Minimize sedating medication -Use CPAP as needed and at at night.  Avoid oxygen unless necessary -OOB/PT/OT -Delirium precaution  Hypotension/history of hypertension: Hypotensive episode the afternoon of 7/10.  Likely due to adrenal insufficiency, dehydration and opiates.  Patient is on chronic prednisone for sarcoidosis.  Hypotension resolved. -Continue holding home losartan -Continue IV fluid -Transition to p.o. prednisone  AKI/azotemia: Likely prerenal from hypotension. Recent Labs    12/24/20 1052 01/13/21 1044 09/12/21 1820 09/14/21 0358  BUN 28* 24 18 39*  CREATININE 1.49* 1.33* 1.08 2.39*  -IV fluid -Monitor -Avoid nephrotoxins.   Uncontrolled IDDM-2 with hyperglycemia: A1c 11.9% (9.5% in 12/2019). Hyperglycemia likely due to steroid. Recent Labs  Lab 09/13/21 1133 09/13/21 1702 09/13/21 2140 09/14/21 0732 09/14/21 1109  GLUCAP 311* 232* 227* 341* 421*  -Discontinue home 70/30 insulin -Semglee 30 units twice daily starting tonight -SSI-resistant -NovoLog 8 units 3 times daily with meals -Further adjustment as appropriate.  Iron deficiency anemia: Anemia panel with severe iron deficiency. Recent Labs    12/24/20 1052 12/25/20 0809 12/25/20 0832 01/13/21 1044 09/12/21 1820 09/14/21 0358  HGB 10.2* 9.2*  8.8* 8.8* 10.2* 11.9* 11.0*  -IV ferric gluconate 250 mg daily x2 -Monitor H&H  Acute respiratory failure with hypoxia: Likely due to sleep apnea and narcotics.  No respiratory symptoms. -Incentive spirometry -CPAP as needed/at bedtime  History of CAD: had 3 DES placed in November 2022.  No anginal symptoms or cardiopulmonary symptoms. -Continue DAPT -Not on statin due to statin  intolerance -Hold losartan due to low blood pressure   Pulmonary sarcoidosis -IV Solu-Medrol >> p.o. prednisone 50 mg daily.  Discharged on taper -Hold home prednisone.   OSA on CPAP -CPAP as needed and at bedtime. -Do not use oxygen unless necessary  Hypothyroidism: TSH normal. -Continue home Synthroid  Bandemia-likely demargination from chronic steroid  Body mass index is 29.21 kg/m.          DVT prophylaxis:  enoxaparin (LOVENOX) injection 40 mg Start: 09/13/21 2200  Code Status: Full code Family Communication: Updated patient's wife at bedside Level of care: Med-Surg Status is: Observation The patient will require care spanning > 2 midnights and should be moved to inpatient because: Acute radicular pain, hypotension, AKI    Final disposition: Likely home in the next 24 to 48 hours Consultants:  Neurosurgery over the phone  Sch Meds:  Scheduled Meds:  acetaminophen  650 mg Oral Q6H   aspirin EC  81 mg Oral QPM   clopidogrel  75 mg Oral Daily   enoxaparin (LOVENOX) injection  40 mg Subcutaneous Q24H   gabapentin  300 mg Oral TID   insulin aspart  0-20 Units Subcutaneous TID WC   insulin aspart  0-5 Units Subcutaneous QHS   insulin aspart  8 Units Subcutaneous TID WC   insulin glargine-yfgn  30 Units Subcutaneous BID   levothyroxine  75 mcg Oral Daily   lidocaine  1 patch  Transdermal Q24H   pantoprazole  40 mg Oral Daily   predniSONE  50 mg Oral Q breakfast   Continuous Infusions:  sodium chloride 125 mL/hr at 09/14/21 1236   ferric gluconate (FERRLECIT) IVPB     PRN Meds:.albuterol, HYDROmorphone (DILAUDID) injection, methocarbamol, montelukast, ondansetron (ZOFRAN) IV, mouth rinse, oxyCODONE, polyethylene glycol, senna-docusate  Antimicrobials: Anti-infectives (From admission, onward)    None        I have personally reviewed the following labs and images: CBC: Recent Labs  Lab 09/12/21 1820 09/14/21 0358  WBC 13.7* 19.3*  NEUTROABS 9.8*   --   HGB 11.9* 11.0*  HCT 40.3 39.2  MCV 86.9 87.9  PLT 292 174   BMP &GFR Recent Labs  Lab 09/12/21 1820 09/14/21 0358 09/14/21 1324  NA 136 136  --   K 4.3 5.0  --   CL 102 98  --   CO2 25 27  --   GLUCOSE 181* 278* 392*  BUN 18 39*  --   CREATININE 1.08 2.39*  --   CALCIUM 8.8* 8.3*  --   MG  --  2.1  --   PHOS  --  4.1  --    Estimated Creatinine Clearance: 30.5 mL/min (A) (by C-G formula based on SCr of 2.39 mg/dL (H)). Liver & Pancreas: Recent Labs  Lab 09/14/21 0358  ALBUMIN 3.0*   No results for input(s): "LIPASE", "AMYLASE" in the last 168 hours. Recent Labs  Lab 09/14/21 0845  AMMONIA 24   Diabetic: Recent Labs    09/14/21 0845  HGBA1C 11.9*   Recent Labs  Lab 09/13/21 1133 09/13/21 1702 09/13/21 2140 09/14/21 0732 09/14/21 1109  GLUCAP 311* 232* 227* 341* 421*   Cardiac Enzymes: No results for input(s): "CKTOTAL", "CKMB", "CKMBINDEX", "TROPONINI" in the last 168 hours. No results for input(s): "PROBNP" in the last 8760 hours. Coagulation Profile: No results for input(s): "INR", "PROTIME" in the last 168 hours. Thyroid Function Tests: Recent Labs    09/14/21 0845  TSH 0.829   Lipid Profile: No results for input(s): "CHOL", "HDL", "LDLCALC", "TRIG", "CHOLHDL", "LDLDIRECT" in the last 72 hours. Anemia Panel: Recent Labs    09/14/21 0259 09/14/21 0845  VITAMINB12  --  727  FOLATE  --  6.3  FERRITIN  --  28  TIBC  --  294  IRON  --  15*  RETICCTPCT 3.0  --    Urine analysis:    Component Value Date/Time   COLORURINE AMBER (A) 09/12/2021 2047   APPEARANCEUR CLEAR 09/12/2021 2047   LABSPEC 1.018 09/12/2021 2047   PHURINE 5.0 09/12/2021 2047   GLUCOSEU 50 (A) 09/12/2021 2047   HGBUR NEGATIVE 09/12/2021 2047   [place] NEGATIVE 09/12/2021 2047   [place] NEGATIVE 09/12/2021 2047   PROTEINUR 30 (A) 09/12/2021 2047   UROBILINOGEN 0.2 11/09/2010 2146   NITRITE NEGATIVE 09/12/2021 2047   LEUKOCYTESUR NEGATIVE 09/12/2021 2047    Sepsis Labs: Invalid input(s): "PROCALCITONIN", "LACTICIDVEN"  Microbiology: No results found for this or any previous visit (from the past 240 hour(s)).  Radiology Studies: No results found.    Aritzel Krusemark T. [place]  If 7PM-7AM, please contact night-coverage [email or web address] 09/14/2021, 3:06 PM

## 2021-09-15 DIAGNOSIS — M5459 Other low back pain: Secondary | ICD-10-CM | POA: Diagnosis not present

## 2021-09-15 LAB — BASIC METABOLIC PANEL
Anion gap: 9 (ref 5–15)
BUN: 46 mg/dL — ABNORMAL HIGH (ref 8–23)
CO2: 22 mmol/L (ref 22–32)
Calcium: 7.9 mg/dL — ABNORMAL LOW (ref 8.9–10.3)
Chloride: 104 mmol/L (ref 98–111)
Creatinine, Ser: 1.83 mg/dL — ABNORMAL HIGH (ref 0.61–1.24)
GFR, Estimated: 38 mL/min — ABNORMAL LOW (ref >60)
Glucose, Bld: 464 mg/dL — ABNORMAL HIGH (ref 70–99)
Potassium: 5.2 mmol/L — ABNORMAL HIGH (ref 3.5–5.1)
Sodium: 135 mmol/L (ref 135–145)

## 2021-09-15 LAB — GLUCOSE, CAPILLARY
Glucose-Capillary: 421 mg/dL — ABNORMAL HIGH (ref 70–99)
Glucose-Capillary: 422 mg/dL — ABNORMAL HIGH (ref 70–99)
Glucose-Capillary: 413 mg/dL — ABNORMAL HIGH (ref 70–99)
Glucose-Capillary: 388 mg/dL — ABNORMAL HIGH (ref 70–99)

## 2021-09-15 MED ORDER — INSULIN ASPART 100 UNIT/ML IJ SOLN
10.0000 [IU] | Freq: Three times a day (TID) | INTRAMUSCULAR | Status: DC
Start: 2021-09-15 — End: 2021-09-15
  Administered 2021-09-15: 10 [IU] via SUBCUTANEOUS

## 2021-09-15 MED ORDER — INSULIN GLARGINE-YFGN 100 UNIT/ML ~~LOC~~ SOLN
40.0000 [IU] | Freq: Two times a day (BID) | SUBCUTANEOUS | Status: DC
  Administered 2021-09-15 – 2021-09-16 (×2): 40 [IU] via SUBCUTANEOUS
  Filled 2021-09-15 (×4): qty 0.4

## 2021-09-15 MED ORDER — INSULIN GLARGINE-YFGN 100 UNIT/ML ~~LOC~~ SOLN
35.0000 [IU] | Freq: Two times a day (BID) | SUBCUTANEOUS | Status: DC
Start: 2021-09-15 — End: 2021-09-15
  Administered 2021-09-15: 35 [IU] via SUBCUTANEOUS
  Filled 2021-09-15 (×2): qty 0.35

## 2021-09-15 MED ORDER — INSULIN GLARGINE-YFGN 100 UNIT/ML ~~LOC~~ SOLN
30.0000 [IU] | Freq: Two times a day (BID) | SUBCUTANEOUS | Status: DC
Start: 2021-09-15 — End: 2021-09-15
  Filled 2021-09-15: qty 0.3

## 2021-09-15 MED ORDER — INSULIN ASPART 100 UNIT/ML IJ SOLN
13.0000 [IU] | Freq: Three times a day (TID) | INTRAMUSCULAR | Status: DC
Start: 2021-09-15 — End: 2021-09-16
  Administered 2021-09-15 – 2021-09-16 (×2): 13 [IU] via SUBCUTANEOUS

## 2021-09-15 MED ORDER — INSULIN GLARGINE-YFGN 100 UNIT/ML ~~LOC~~ SOLN: 40.0000 [IU] | Freq: Two times a day (BID) | SUBCUTANEOUS | Status: DC

## 2021-09-15 MED ORDER — GABAPENTIN 100 MG PO CAPS
100.0000 mg | ORAL_CAPSULE | Freq: Three times a day (TID) | ORAL | Status: DC
  Administered 2021-09-15 – 2021-09-16 (×4): 100 mg via ORAL
  Filled 2021-09-15 (×4): qty 1

## 2021-09-15 MED ORDER — SODIUM ZIRCONIUM CYCLOSILICATE 5 G PO PACK
5.0000 g | PACK | Freq: Once | ORAL | Status: AC
  Administered 2021-09-15: 5 g via ORAL
  Filled 2021-09-15: qty 1

## 2021-09-15 NOTE — Inpatient Diabetes Management (Addendum)
Inpatient Diabetes Program Recommendations  AACE/ADA: New Consensus Statement on Inpatient Glycemic Control (2015)  Target Ranges:  Prepandial:   less than 140 mg/dL      Peak postprandial:   less than 180 mg/dL (1-2 hours)      Critically ill patients:  140 - 180 mg/dL   Lab Results  Component Value Date   GLUCAP 422 (H) 09/15/2021   HGBA1C 11.9 (H) 09/14/2021    Review of Glycemic Control  Latest Reference Range & Units 09/14/21 17:02 09/14/21 21:56 09/15/21 07:34 09/15/21 12:04  Glucose-Capillary 70 - 99 mg/dL 280 (H) 246 (H) 421 (H) 422 (H)  (H): Data is abnormally high Diabetes history: Type 2 DM Outpatient Diabetes medications: Glipizide 10 mg (NT), Novolog 70/30 30-45 units TID Current orders for Inpatient glycemic control: Semglee 35 units BID, Novolog 0-20 units TID & HS, Novolog 8 units TID  Prednisone 50 mg BID   Inpatient Diabetes Program Recommendations:    In the setting of steroids, consider: -Increasing Semglee 40 units BID -Novolog 15 units TID (assuming patient is consuming >50% of meals).  Will plan to see patient.   Addendum: Spoke with patient and wife at length regarding outpatient diabetes management. Patient has received steroid injections to back. He reports his blood sugars at home have been within range, however home dosages were different compared to previous report. Reviewed patient's current A1c of 11.9%. Explained what a A1c is and what it measures. Also reviewed goal A1c with patient, importance of good glucose control @ home, and blood sugar goals. Reviewed patho of DM, risk of elevated glucose, differences between 70/30 vs basal/bolus, reasons for adjusting insulin regimen, importance of food intake, impact of oral steroids and injections, current insulin needs, vascular changes and commorbidities.  Patient reports checking three times per day with a range of 130-200's mg/dL. Review when to call Md.  Admits to occasionally eating sweets at  holidays. Tries to be mindful. Encouraged to continue choosing foods low in CHO, reaching out to MD when elevated, discussed endocrinology to have plan for increasing insulin doses with steroids and insulin dose increases planned for today. Patient is requesting to be placed back on Novolog 70/30 30 units TID. At this time, discussed the importance of consistent food intake, action peaks of insulin and need for titration. Secure chat sent to MD.   Thanks, Bronson Curb, MSN, RNC-OB Diabetes Coordinator [phone] (8a-5p)

## 2021-09-15 NOTE — Plan of Care (Signed)
  Problem: Pain Managment: Goal: General experience of comfort will improve Outcome: Progressing   Problem: Safety: Goal: Ability to remain free from injury will improve Outcome: Progressing   

## 2021-09-15 NOTE — Progress Notes (Signed)
Physical Therapy Treatment Patient Details Name: [name] MRN: [id number] DOB: 01-18-1946 Today's Date: 09/15/2021   History of Present Illness 76 year old male admitted on 09/12/21 for pain control and further evaluation after he stumbled on 09/05/2021, twisting awkwardly, and experienced immediate increase in low back pain with radiation down his right leg. Despite beginning pain and muscle relaxers after going to an orthopedic clinic, he has been unable to get out of bed due to the severe pain with any movement. Past medical history significant for CAD with stents, OSA, insulin-dependent diabetes mellitus, sarcoidosis on prednisone, and chronic low back pain with sciatica    PT Comments    Pt already received pain meds this morning however was due for tylenol so premedicated with tylenol as other pain meds not yet due.  Pt reports 4/10 back pain at rest which increased to 8/10 during mobility however pt able to ambulate 60 feet with RW today.  Pt min/guard assist for safety however did not require physical assist today.  Pt agreeable to remain OOB in recliner for at least an hour and nursing aware.      Recommendations for follow up therapy are one component of a multi-disciplinary discharge planning process, led by the attending physician.  Recommendations may be updated based on patient status, additional functional criteria and insurance authorization.  Follow Up Recommendations  Home health PT     Assistance Recommended at Discharge PRN  Patient can return home with the following A little help with bathing/dressing/bathroom;A little help with walking and/or transfers;Help with stairs or ramp for entrance   Equipment Recommendations  None recommended by PT    Recommendations for Other Services       Precautions / Restrictions Precautions Precautions: Fall;Back Precaution Comments: Utilizing back precautions for pain management     Mobility  Bed Mobility Overal bed mobility:  Needs Assistance Bed Mobility: Supine to Sit     Supine to sit: Min guard     General bed mobility comments: pt utilizes his own technique with he states is less painful; requires time but able to perform without physical assist    Transfers Overall transfer level: Needs assistance Equipment used: Rolling walker (2 wheels) Transfers: Sit to/from Stand Sit to Stand: Min guard           General transfer comment: cues for hand placement    Ambulation/Gait Ambulation/Gait assistance: Min guard Gait Distance (Feet): 60 Feet Assistive device: Rolling walker (2 wheels) Gait Pattern/deviations: Step-through pattern, Decreased stride length, Trunk flexed       General Gait Details: cues for RW positioning and step length; pt reports pain increase to 8/10 however feels his distance and dysnpea are similiar to baseline; pt reports back pain eased with resting/sitting in recliner   Stairs             Wheelchair Mobility    Modified Rankin (Stroke Patients Only)       Balance                                            Cognition Arousal/Alertness: Awake/alert Behavior During Therapy: WFL for tasks assessed/performed Overall Cognitive Status: Within Functional Limits for tasks assessed  Exercises      General Comments        Pertinent Vitals/Pain Pain Assessment Pain Assessment: 0-10 Pain Score: 8  Pain Location: back with mobilizing Pain Descriptors / Indicators: Sharp, Grimacing, Guarding Pain Intervention(s): Repositioned, Monitored during session    Home Living                          Prior Function            PT Goals (current goals can now be found in the care plan section) Progress towards PT goals: Progressing toward goals    Frequency    Min 3X/week      PT Plan Current plan remains appropriate    Co-evaluation              AM-PAC PT  "6 Clicks" Mobility   Outcome Measure  Help needed turning from your back to your side while in a flat bed without using bedrails?: A Little Help needed moving from lying on your back to sitting on the side of a flat bed without using bedrails?: A Little Help needed moving to and from a bed to a chair (including a wheelchair)?: A Little Help needed standing up from a chair using your arms (e.g., wheelchair or bedside chair)?: A Little Help needed to walk in hospital room?: A Little Help needed climbing 3-5 steps with a railing? : A Lot 6 Click Score: 17    End of Session Equipment Utilized During Treatment: Gait belt Activity Tolerance: Patient tolerated treatment well Patient left: with call bell/phone within reach;in chair;with family/visitor present Nurse Communication: Mobility status PT Visit Diagnosis: Difficulty in walking, not elsewhere classified (R26.2)     Time: 4656-8127 PT Time Calculation (min) (ACUTE ONLY): 17 min  Charges:  $Gait Training: 8-22 mins                     Jannette Spanner PT, DPT Physical Therapist Acute Rehabilitation Services Preferred contact method: Secure Chat Weekend Pager Only: [phone] Office: [phone]    [name] [place] 09/15/2021, 3:24 PM

## 2021-09-15 NOTE — Progress Notes (Signed)
PROGRESS NOTE  [name] XTG:[id number] DOB: 08-May-1945   PCP: Shirline Frees, MD  Patient is from: Home.  Lives with wife.  Occasionally uses rolling walker  DOA: 09/12/2021 LOS: 1  Chief complaints Chief Complaint  Patient presents with   Back Pain     Brief Narrative / Interim history: 76 year old M with history of CAD/stents, OSA on CPAP, IDDM-2, chronic lumbar pain with sciatica and sarcoidosis on prednisone presenting with acute on chronic lower back pain with sciatica after he stumbled and twisted awkwardly on 09/05/2021.  He was seen by his orthopedic surgeon outpatient and started on muscle relaxer and tramadol without significant improvement.  He had no leg weakness, bowel or bladder habit change.  MRI lumbar spine showed anterolisthesis, disc bulge and right foraminal disc extrusion at L4-5 resulting in mild to moderate spinal canal stenosis and severe bilateral neural foraminal narrowing, greater on the right side, with impingement of the bilateral L4 nerve roots.  Neurosurgery consulted and recommended steroid taper and outpatient follow-up for surgical evaluation.   Hospital course complicated by encephalopathy, hypotension and AKI.   Subjective: Had severe pain when working with PT yesterday but only got tylenol  Objective: Vitals:   09/14/21 0945 09/14/21 1328 09/14/21 2154 09/15/21 0625  BP: 129/62 (!) 115/57 129/68 140/66  Pulse: (!) 101 91 76 83  Resp: '17 16 18 18  '$ Temp: 98.3 F (36.8 C) 98.4 F (36.9 C) 98.3 F (36.8 C) 98.3 F (36.8 C)  TempSrc: Oral Oral Oral Oral  SpO2: (!) 89% 90% 95% 91%  Weight:      Height:        Examination:   General: Appearance:     Overweight male in no acute distress     Lungs:     respirations unlabored  Heart:    Normal heart rate.  MS:   All extremities are intact.   Neurologic:   Awake, alert     Assessment and plan: Principal Problem:   Intractable low back pain Active Problems:   Diabetes mellitus without  complication (HCC)   Coronary artery disease with exertional angina (HCC)   OSA (obstructive sleep apnea)   Chronic diastolic heart failure (HCC)   Pulmonary sarcoidosis (HCC)   Normocytic anemia   Iron deficiency anemia   AKI (acute kidney injury) ([place])   Hypothyroidism   Acute encephalopathy   Bandemia   Acute respiratory failure with hypoxia (HCC)   Lumbar radiculopathy, acute   Azotemia   Lumbar radiculopathy: Reportedly stumbled and twisted on 09/05/2021.  Evaluated by his orthopedic surgery at [place].  No improvement with muscle relaxer and tramadol outpatient.  Has RLE weakness and tingling.  No signs and symptoms of cauda equina.  CT spine without acute finding.  MRI lumbar spine with anterolisthesis, disc bulge and right foraminal disc extrusion at L4-5 resulting in mild to moderate spinal canal stenosis and severe bilateral neural foraminal narrowing, greater on the right side, with impingement of the bilateral L4 nerve roots.  -Dr. Cyndia Skeeters discussed with Neurosurgery, Dr. Venetia Constable: recommends steroid taper and outpatient follow-up for surgical evaluation -Continue Tylenol, Robaxin, oxycodone -steroid taper -gabapentin was added by Dr. Cyndia Skeeters-- wean down as causing sedation -PT/OT  Acute encephalopathy: Likely toxic from pain medication.  Resolved. -Minimize sedating medication -Use CPAP as needed and at at night. -OOB/PT/OT -Delirium precautions  Hypotension/history of hypertension:  -Hypotensive episode the afternoon of 7/10.  Likely due to adrenal insufficiency, dehydration and opiates.  Patient is on chronic prednisone  for sarcoidosis.  Hypotension resolved. -Continue holding home losartan -Transition to p.o. prednisone  AKI/azotemia: Likely prerenal from hypotension. -IV fluid-held-- encourage PO intake -daily labs -Avoid nephrotoxins.   Uncontrolled IDDM-2 with hyperglycemia: A1c 11.9% (9.5% in 12/2019). Hyperglycemia likely due to steroid. -Semglee 35  units twice daily  -SSI-resistant -NovoLog 8 units 3 times daily with meals  Iron deficiency anemia: Anemia panel with severe iron deficiency. -IV ferric gluconate 250 mg daily x2  Acute respiratory failure with hypoxia: Likely due to sleep apnea and narcotics.  No respiratory symptoms. -Incentive spirometry -CPAP as needed/at bedtime  History of CAD: had 3 DES placed in November 2022.  No anginal symptoms or cardiopulmonary symptoms. -Continue DAPT -Not on statin due to statin intolerance -Hold losartan due to low blood pressure -if surgery needed, will need to discuss with cards prior: per d/c summary: Plan for DAPT with ASA/Plavix for at least 6 months but likely longer if patient tolerating well given multivessel stenting   Pulmonary sarcoidosis -IV Solu-Medrol >> p.o. prednisone 50 mg daily.  Plan to d/c on taper to home dose  OSA on CPAP -CPAP as needed and at bedtime. -Do not use oxygen unless necessary  Hypothyroidism: TSH normal. -Continue home Synthroid       DVT prophylaxis:  enoxaparin (LOVENOX) injection 40 mg Start: 09/13/21 2200  Code Status: Full code Family Communication: Updated patient's wife at bedside Level of care: Med-Surg    Final disposition: home in the AM   Consultants:  Neurosurgery over the phone- per Dr. Cyndia Skeeters  Sch Meds:  Scheduled Meds:  acetaminophen  650 mg Oral Q6H   aspirin EC  81 mg Oral QPM   clopidogrel  75 mg Oral Daily   enoxaparin (LOVENOX) injection  40 mg Subcutaneous Q24H   gabapentin  100 mg Oral TID   insulin aspart  0-20 Units Subcutaneous TID WC   insulin aspart  0-5 Units Subcutaneous QHS   insulin aspart  8 Units Subcutaneous TID WC   insulin glargine-yfgn  35 Units Subcutaneous BID   levothyroxine  75 mcg Oral Daily   lidocaine  1 patch Transdermal Q24H   pantoprazole  40 mg Oral Daily   predniSONE  50 mg Oral Q breakfast   Continuous Infusions:  ferric gluconate (FERRLECIT) IVPB 250 mg (09/15/21 0858)    PRN Meds:.albuterol, HYDROmorphone (DILAUDID) injection, methocarbamol, montelukast, ondansetron (ZOFRAN) IV, mouth rinse, oxyCODONE, polyethylene glycol, senna-docusate     I have personally reviewed the following labs and images: CBC: Recent Labs  Lab 09/12/21 1820 09/14/21 0358  WBC 13.7* 19.3*  NEUTROABS 9.8*  --   HGB 11.9* 11.0*  HCT 40.3 39.2  MCV 86.9 87.9  PLT 292 174   BMP &GFR Recent Labs  Lab 09/12/21 1820 09/14/21 0358 09/14/21 1324 09/15/21 0903  NA 136 136  --  135  K 4.3 5.0  --  5.2*  CL 102 98  --  104  CO2 25 27  --  22  GLUCOSE 181* 278* 392* 464*  BUN 18 39*  --  46*  CREATININE 1.08 2.39*  --  1.83*  CALCIUM 8.8* 8.3*  --  7.9*  MG  --  2.1  --   --   PHOS  --  4.1  --   --    Estimated Creatinine Clearance: 39.9 mL/min (A) (by C-G formula based on SCr of 1.83 mg/dL (H)). Liver & Pancreas: Recent Labs  Lab 09/14/21 0358  ALBUMIN 3.0*   No results for  input(s): "LIPASE", "AMYLASE" in the last 168 hours. Recent Labs  Lab 09/14/21 0845  AMMONIA 24   Diabetic: Recent Labs    09/14/21 0845  HGBA1C 11.9*   Recent Labs  Lab 09/14/21 0732 09/14/21 1109 09/14/21 1702 09/14/21 2156 09/15/21 0734  GLUCAP 341* 421* 280* 246* 421*   Cardiac Enzymes: No results for input(s): "CKTOTAL", "CKMB", "CKMBINDEX", "TROPONINI" in the last 168 hours. No results for input(s): "PROBNP" in the last 8760 hours. Coagulation Profile: No results for input(s): "INR", "PROTIME" in the last 168 hours. Thyroid Function Tests: Recent Labs    09/14/21 0845  TSH 0.829   Lipid Profile: No results for input(s): "CHOL", "HDL", "LDLCALC", "TRIG", "CHOLHDL", "LDLDIRECT" in the last 72 hours. Anemia Panel: Recent Labs    09/14/21 0259 09/14/21 0845  VITAMINB12  --  727  FOLATE  --  6.3  FERRITIN  --  28  TIBC  --  294  IRON  --  15*  RETICCTPCT 3.0  --    Urine analysis:    Component Value Date/Time   COLORURINE AMBER (A) 09/12/2021 2047    APPEARANCEUR CLEAR 09/12/2021 2047   LABSPEC 1.018 09/12/2021 2047   PHURINE 5.0 09/12/2021 2047   GLUCOSEU 50 (A) 09/12/2021 2047   HGBUR NEGATIVE 09/12/2021 2047   [place] NEGATIVE 09/12/2021 2047   [place] NEGATIVE 09/12/2021 2047   PROTEINUR 30 (A) 09/12/2021 2047   UROBILINOGEN 0.2 11/09/2010 2146   NITRITE NEGATIVE 09/12/2021 2047   LEUKOCYTESUR NEGATIVE 09/12/2021 2047     Eulogio Bear DO [facility]  If 7PM-7AM, please contact night-coverage [email or web address] 09/15/2021, 10:40 AM

## 2021-09-16 ENCOUNTER — Other Ambulatory Visit (HOSPITAL_COMMUNITY): Payer: Self-pay

## 2021-09-16 ENCOUNTER — Encounter: Payer: Self-pay | Admitting: Family

## 2021-09-16 DIAGNOSIS — M5459 Other low back pain: Secondary | ICD-10-CM | POA: Diagnosis not present

## 2021-09-16 LAB — BASIC METABOLIC PANEL
Anion gap: 8 (ref 5–15)
BUN: 37 mg/dL — ABNORMAL HIGH (ref 8–23)
CO2: 25 mmol/L (ref 22–32)
Calcium: 8.8 mg/dL — ABNORMAL LOW (ref 8.9–10.3)
Chloride: 107 mmol/L (ref 98–111)
Creatinine, Ser: 1.62 mg/dL — ABNORMAL HIGH (ref 0.61–1.24)
GFR, Estimated: 44 mL/min — ABNORMAL LOW (ref >60)
Glucose, Bld: 294 mg/dL — ABNORMAL HIGH (ref 70–99)
Potassium: 4.9 mmol/L (ref 3.5–5.1)
Sodium: 140 mmol/L (ref 135–145)

## 2021-09-16 LAB — GLUCOSE, CAPILLARY
Glucose-Capillary: 284 mg/dL — ABNORMAL HIGH (ref 70–99)
Glucose-Capillary: 314 mg/dL — ABNORMAL HIGH (ref 70–99)

## 2021-09-16 MED ORDER — FUROSEMIDE 80 MG PO TABS: 80.0000 mg | ORAL_TABLET | Freq: Every day | ORAL | 0 refills | Status: DC | PRN

## 2021-09-16 MED ORDER — SENNOSIDES-DOCUSATE SODIUM 8.6-50 MG PO TABS: 1.0000 | ORAL_TABLET | Freq: Two times a day (BID) | ORAL | Status: DC | PRN

## 2021-09-16 MED ORDER — PREDNISONE 10 MG PO TABS
ORAL_TABLET | ORAL | 0 refills | Status: DC
Start: 2021-09-17 — End: 2021-09-25
  Filled 2021-09-16: qty 30, 12d supply, fill #0

## 2021-09-16 MED ORDER — HYDRALAZINE HCL 10 MG PO TABS
10.0000 mg | ORAL_TABLET | Freq: Three times a day (TID) | ORAL | Status: DC
Start: 2021-09-16 — End: 2021-09-16
  Administered 2021-09-16: 10 mg via ORAL
  Filled 2021-09-16 (×2): qty 1

## 2021-09-16 MED ORDER — OXYCODONE HCL 5 MG PO TABS
5.0000 mg | ORAL_TABLET | Freq: Four times a day (QID) | ORAL | 0 refills | Status: DC | PRN
Start: 2021-09-16 — End: 2021-09-25
  Filled 2021-09-16: qty 10, 3d supply, fill #0

## 2021-09-16 MED ORDER — ACETAMINOPHEN 325 MG PO TABS: 650.0000 mg | ORAL_TABLET | Freq: Four times a day (QID) | ORAL | Status: DC

## 2021-09-16 MED ORDER — HYDRALAZINE HCL 10 MG PO TABS
10.0000 mg | ORAL_TABLET | Freq: Three times a day (TID) | ORAL | 0 refills | Status: DC
  Filled 2021-09-16: qty 30, 10d supply, fill #0

## 2021-09-16 MED ORDER — INSULIN ASPART 100 UNIT/ML IJ SOLN
15.0000 [IU] | Freq: Three times a day (TID) | INTRAMUSCULAR | Status: DC
  Administered 2021-09-16: 15 [IU] via SUBCUTANEOUS

## 2021-09-16 MED ORDER — GABAPENTIN 100 MG PO CAPS
100.0000 mg | ORAL_CAPSULE | Freq: Three times a day (TID) | ORAL | 0 refills | Status: DC
  Filled 2021-09-16: qty 90, 30d supply, fill #0

## 2021-09-16 MED ORDER — INSULIN ASPART PROT & ASPART (70-30 MIX) 100 UNIT/ML ~~LOC~~ SUSP
40.0000 [IU] | Freq: Three times a day (TID) | SUBCUTANEOUS | Status: DC
Start: 2021-09-16 — End: 2023-10-10

## 2021-09-16 NOTE — Progress Notes (Signed)
Physical Therapy Treatment Patient Details Name: [name] MRN: [id number] DOB: 31-Jul-1945 Today's Date: 09/16/2021   History of Present Illness 76 year old male admitted on 09/12/21 for pain control and further evaluation after he stumbled on 09/05/2021, twisting awkwardly, and experienced immediate increase in low back pain with radiation down his right leg. Despite beginning pain and muscle relaxers after going to an orthopedic clinic, he has been unable to get out of bed due to the severe pain with any movement. Past medical history significant for CAD with stents, OSA, insulin-dependent diabetes mellitus, sarcoidosis on prednisone, and chronic low back pain with sciatica    PT Comments    Pt sitting in recliner and dressed on arrival. Pt premedicated and to discharge home today.  Pt able to improve ambulation distance and only reports 4/10 back pain with ambulating today (compared to 8/10 yesterday).  Pt encouraged to continue ambulating with walker at home when pain is under control (to continue strength and function as possible back surgery pending follow up) and only use his scooter when needed for pain.  Pt feels ready to d/c home today.   Recommendations for follow up therapy are one component of a multi-disciplinary discharge planning process, led by the attending physician.  Recommendations may be updated based on patient status, additional functional criteria and insurance authorization.  Follow Up Recommendations  Home health PT     Assistance Recommended at Discharge PRN  Patient can return home with the following A little help with bathing/dressing/bathroom;A little help with walking and/or transfers;Help with stairs or ramp for entrance   Equipment Recommendations  None recommended by PT    Recommendations for Other Services       Precautions / Restrictions Precautions Precautions: Fall;Back Precaution Comments: Utilizing back precautions for pain management      Mobility  Bed Mobility               General bed mobility comments: pt in recliner    Transfers Overall transfer level: Needs assistance Equipment used: Rolling walker (2 wheels) Transfers: Sit to/from Stand Sit to Stand: Min guard, Supervision                Ambulation/Gait Ambulation/Gait assistance: Min guard, Supervision Gait Distance (Feet): 160 Feet Assistive device: Rolling walker (2 wheels) Gait Pattern/deviations: Step-through pattern, Decreased stride length, Trunk flexed       General Gait Details: pt only reports 4/10 back pain today and able to tolerate improved distance   Stairs             Wheelchair Mobility    Modified Rankin (Stroke Patients Only)       Balance                                            Cognition Arousal/Alertness: Awake/alert Behavior During Therapy: WFL for tasks assessed/performed Overall Cognitive Status: Within Functional Limits for tasks assessed                                          Exercises      General Comments        Pertinent Vitals/Pain Pain Assessment Pain Assessment: 0-10 Pain Score: 4  Pain Location: back with mobilizing Pain Descriptors / Indicators: Sharp, Grimacing, Guarding Pain  Intervention(s): Monitored during session, Premedicated before session, Repositioned    Home Living                          Prior Function            PT Goals (current goals can now be found in the care plan section) Progress towards PT goals: Progressing toward goals    Frequency    Min 3X/week      PT Plan Current plan remains appropriate    Co-evaluation              AM-PAC PT "6 Clicks" Mobility   Outcome Measure  Help needed turning from your back to your side while in a flat bed without using bedrails?: A Little Help needed moving from lying on your back to sitting on the side of a flat bed without using bedrails?: A  Little Help needed moving to and from a bed to a chair (including a wheelchair)?: A Little Help needed standing up from a chair using your arms (e.g., wheelchair or bedside chair)?: A Little Help needed to walk in hospital room?: A Little Help needed climbing 3-5 steps with a railing? : A Lot 6 Click Score: 17    End of Session   Activity Tolerance: Patient tolerated treatment well Patient left: in chair;with call bell/phone within reach;with family/visitor present   PT Visit Diagnosis: Difficulty in walking, not elsewhere classified (R26.2)     Time: 1251-1300 PT Time Calculation (min) (ACUTE ONLY): 9 min  Charges:  $Gait Training: 8-22 mins                     Jannette Spanner PT, DPT Physical Therapist Acute [facility] Services Preferred contact method: Secure Chat Weekend Pager Only: [phone] Office: [phone]    [name] 09/16/2021, 2:26 PM

## 2021-09-16 NOTE — Discharge Summary (Signed)
Physician Discharge Summary  [name] TKZ:[id number] DOB: [birth year] DOA: 09/12/2021  PCP: Shirline Frees, MD  Admit date: 09/12/2021 Discharge date: 09/16/2021  Admitted From: home Discharge disposition: home   Recommendations for Outpatient Follow-Up:   BMP 1 week re: Cr-- resume ARB if able Message regarding plavix sent to cardiology regarding holding if procedure needs to be done Needs adjustment of insulin while on steroid taper   Discharge Diagnosis:   Principal Problem:   Intractable low back pain Active Problems:   Diabetes mellitus without complication ([place])   Coronary artery disease with exertional angina (HCC)   OSA (obstructive sleep apnea)   Chronic diastolic heart failure (HCC)   Pulmonary sarcoidosis (HCC)   Normocytic anemia   Iron deficiency anemia   AKI (acute kidney injury) ([place])   Hypothyroidism   Acute encephalopathy   Bandemia   Acute respiratory failure with hypoxia (HCC)   Lumbar radiculopathy, acute   Azotemia    Discharge Condition: Improved.  Diet recommendation: Low sodium, heart healthy.  Carbohydrate-modified.   Wound care: None.  Code status: Full.   History of Present Illness:   [name] is a pleasant 76 y.o. male with medical history significant for CAD with stents, OSA, insulin-dependent diabetes mellitus, sarcoidosis on prednisone, and chronic low back pain with sciatica who now presents with severe worsening in his chronic low back pain.  Patient reports that his low back pain and sciatica had improved after recent injection in the orthopedic clinic, but he stumbled on 09/05/2021, twisting awkwardly, and experienced immediate increase in low back pain with radiation down his right leg.  He was seen in the orthopedic clinic the following day where he reports that x-rays were obtained and he was prescribed a muscle relaxant.  He continued to have severe pain and went back to the clinic where he was given tramadol.  Despite  this, he has been unable to get out of bed due to the severe pain with any movement.  He denies change in bowel or bladder function, numbness in his saddle area, or leg weakness.  He has increase in his chronic right leg numbness.  Denies fevers, chills, chest pain, cough, or shortness of breath   Hospital Course by Problem:   Lumbar radiculopathy: Reportedly stumbled and twisted on 09/05/2021.  Evaluated by his orthopedic surgery at [facility].  No improvement with muscle relaxer and tramadol outpatient.  Has RLE weakness and tingling.  No signs and symptoms of cauda equina.  CT spine without acute finding.  MRI lumbar spine with anterolisthesis, disc bulge and right foraminal disc extrusion at L4-5 resulting in mild to moderate spinal canal stenosis and severe bilateral neural foraminal narrowing, greater on the right side, with impingement of the bilateral L4 nerve roots.  -Dr. Cyndia Skeeters discussed with Neurosurgery, Dr. Venetia Constable: recommends steroid taper and outpatient follow-up for surgical evaluation -Continue Tylenol, Robaxin, oxycodone -steroid taper -gabapentin was added by Dr. Cyndia Skeeters-- weaned down as causing sedation and patient says good results -PT/OT   Acute encephalopathy: Likely toxic from pain medication.  Resolved. -Minimize sedating medication -Use CPAP as needed and at at night. -OOB/PT/OT -Delirium precautions   Hypotension/history of hypertension:  -Hypotensive episode the afternoon of 7/10.  Likely due to adrenal insufficiency, dehydration and opiates.  Patient is on chronic prednisone for sarcoidosis.  Hypotension resolved. -Continue holding home losartan -Transition to p.o. prednisone   AKI/azotemia: Likely prerenal from hypotension. -IV fluid-held-- encourage PO intake -daily labs -Avoid nephrotoxins.  Uncontrolled IDDM-2 with hyperglycemia: A1c 11.9% (9.5% in 12/2019). Hyperglycemia likely due to steroid. -resume home meds at a higher dose   Iron deficiency  anemia: Anemia panel with severe iron deficiency. -IV ferric gluconate 250 mg daily x2   Acute respiratory failure with hypoxia: Likely due to sleep apnea and narcotics.  No respiratory symptoms. -Incentive spirometry -CPAP as needed/at bedtime   History of CAD: had 3 DES placed in November 2022.  No anginal symptoms or cardiopulmonary symptoms. -Continue DAPT -Not on statin due to statin intolerance -Hold losartan due to low blood pressure -if surgery needed, will need to discuss with cards prior: per d/c summary: Plan for DAPT with ASA/Plavix for at least 6 months but likely longer if patient tolerating well given multivessel stenting-- message sent to cards   Pulmonary sarcoidosis -IV Solu-Medrol >> p.o. prednisone 50 mg daily.  Plan to d/c on taper to home dose   OSA on CPAP -CPAP as needed and at bedtime. -Do not use oxygen unless necessary   Hypothyroidism: TSH normal. -Continue home Synthroid        Medical Consultants:   NS (phone)    Discharge Exam:   Vitals:   09/16/21 0753 09/16/21 1147  BP: (!) 182/76 139/66  Pulse: 63 70  Resp: 17 18  Temp: 98 F (36.7 C) 98 F (36.7 C)  SpO2: 97% 93%   Vitals:   09/16/21 0012 09/16/21 0522 09/16/21 0753 09/16/21 1147  BP: (!) 158/74 (!) 179/87 (!) 182/76 139/66  Pulse: 74 61 63 70  Resp: '18 17 17 18  '$ Temp: 97.8 F (36.6 C) 98.2 F (36.8 C) 98 F (36.7 C) 98 F (36.7 C)  TempSrc: Oral  Oral Oral  SpO2: 97% 96% 97% 93%  Weight:      Height:        General exam: Appears calm and comfortable.  .    The results of significant diagnostics from this hospitalization (including imaging, microbiology, ancillary and laboratory) are listed below for reference.     Procedures and Diagnostic Studies:   MR LUMBAR SPINE WO CONTRAST  Result Date: 09/13/2021 CLINICAL DATA:  Lumbar radiculopathy, symptoms persist with > 6 wks treatment. EXAM: MRI LUMBAR SPINE WITHOUT CONTRAST TECHNIQUE: Multiplanar, multisequence MR  imaging of the lumbar spine was performed. No intravenous contrast was administered. COMPARISON:  CT lumbar spine September 13, 2021. FINDINGS: Segmentation:  Standard. Alignment: Grade 1 anterolisthesis of L4 over L5 related to facet arthropathy. Vertebrae:  No fracture, evidence of discitis, or bone lesion.  L1-2 Conus medullaris and cauda equina: Conus extends to the L1-2 level. Conus and cauda equina appear normal. Paraspinal and other soft tissues: Negative. Disc levels: T12-L1: No spinal canal or foraminal stenosis. L1-2: No spinal canal neural foraminal stenosis. L2-3: Mild facet degenerative changes. No spinal canal or neural foraminal stenosis. L3-4: Disc bulge asymmetric to the left and moderate facet degenerative changes with mild bilateral joint effusion resulting in mild-to-moderate left neural foraminal narrowing. L4-5: Disc bulge/disc uncovering with superimposed left foraminal disc extrusion, moderate facet degenerative changes with bilateral joint effusion and ligamentum flavum redundancy. Findings result in mild-to-moderate spinal canal stenosis with narrowing of the bilateral subarticular zones, severe bilateral neural foraminal narrowing, greater on the right side. L5-S1: Mild facet degenerative changes. No spinal canal or neural foraminal stenosis. IMPRESSION: Anterolisthesis, disc bulge and right foraminal disc extrusion at L4-5 resulting in mild to moderate spinal canal stenosis and severe bilateral neural foraminal narrowing, greater on the right side, with impingement  of the bilateral L4 nerve roots. Electronically Signed   By: Pedro Earls M.D.   On: 09/13/2021 15:04   CT Thoracic Spine Wo Contrast  Result Date: 09/13/2021 CLINICAL DATA:  Mid-back pain back pain status post twisting mechanism; back pain status post twisting mechanism. EXAM: CT THORACIC AND LUMBAR SPINE WITHOUT CONTRAST TECHNIQUE: Multidetector CT imaging of the thoracic and lumbar spine was performed without  contrast. Multiplanar CT image reconstructions were also generated. RADIATION DOSE REDUCTION: This exam was performed according to the departmental dose-optimization program which includes automated exposure control, adjustment of the mA and/or kV according to patient size and/or use of iterative reconstruction technique. COMPARISON:  None Available. FINDINGS: CT THORACIC SPINE FINDINGS Alignment: Normal. Vertebrae: No acute fracture or focal pathologic process. Paraspinal and other soft tissues: Calcific aortic atherosclerosis. Disc levels: No spinal canal stenosis. CT LUMBAR SPINE FINDINGS Segmentation: 5 lumbar type vertebrae. Alignment: Grade 1 anterolisthesis at L4-5 Vertebrae: No acute fracture or focal pathologic process. Paraspinal and other soft tissues: Negative Disc levels: Moderate bilateral L4 neural foraminal stenosis. IMPRESSION: 1. No acute fracture or traumatic subluxation of the thoracic or lumbar spine. 2. Grade 1 anterolisthesis at L4-5 with moderate bilateral neural foraminal stenosis. 3. Aortic Atherosclerosis (ICD10-I70.0). Electronically Signed   By: Ulyses Jarred M.D.   On: 09/13/2021 02:49   CT Lumbar Spine Wo Contrast  Result Date: 09/13/2021 CLINICAL DATA:  Mid-back pain back pain status post twisting mechanism; back pain status post twisting mechanism. EXAM: CT THORACIC AND LUMBAR SPINE WITHOUT CONTRAST TECHNIQUE: Multidetector CT imaging of the thoracic and lumbar spine was performed without contrast. Multiplanar CT image reconstructions were also generated. RADIATION DOSE REDUCTION: This exam was performed according to the departmental dose-optimization program which includes automated exposure control, adjustment of the mA and/or kV according to patient size and/or use of iterative reconstruction technique. COMPARISON:  None Available. FINDINGS: CT THORACIC SPINE FINDINGS Alignment: Normal. Vertebrae: No acute fracture or focal pathologic process. Paraspinal and other soft  tissues: Calcific aortic atherosclerosis. Disc levels: No spinal canal stenosis. CT LUMBAR SPINE FINDINGS Segmentation: 5 lumbar type vertebrae. Alignment: Grade 1 anterolisthesis at L4-5 Vertebrae: No acute fracture or focal pathologic process. Paraspinal and other soft tissues: Negative Disc levels: Moderate bilateral L4 neural foraminal stenosis. IMPRESSION: 1. No acute fracture or traumatic subluxation of the thoracic or lumbar spine. 2. Grade 1 anterolisthesis at L4-5 with moderate bilateral neural foraminal stenosis. 3. Aortic Atherosclerosis (ICD10-I70.0). Electronically Signed   By: Ulyses Jarred M.D.   On: 09/13/2021 02:49     Labs:   Basic Metabolic Panel: Recent Labs  Lab 09/12/21 1820 09/14/21 0358 09/14/21 1324 09/15/21 0903 09/16/21 0354  NA 136 136  --  135 140  K 4.3 5.0  --  5.2* 4.9  CL 102 98  --  104 107  CO2 25 27  --  22 25  GLUCOSE 181* 278* 392* 464* 294*  BUN 18 39*  --  46* 37*  CREATININE 1.08 2.39*  --  1.83* 1.62*  CALCIUM 8.8* 8.3*  --  7.9* 8.8*  MG  --  2.1  --   --   --   PHOS  --  4.1  --   --   --    GFR Estimated Creatinine Clearance: 45 mL/min (A) (by C-G formula based on SCr of 1.62 mg/dL (H)). Liver Function Tests: Recent Labs  Lab 09/14/21 0358  ALBUMIN 3.0*   No results for input(s): "LIPASE", "AMYLASE" in the last  168 hours. Recent Labs  Lab 09/14/21 0845  AMMONIA 24   Coagulation profile No results for input(s): "INR", "PROTIME" in the last 168 hours.  CBC: Recent Labs  Lab 09/12/21 1820 09/14/21 0358  WBC 13.7* 19.3*  NEUTROABS 9.8*  --   HGB 11.9* 11.0*  HCT 40.3 39.2  MCV 86.9 87.9  PLT 292 174   Cardiac Enzymes: No results for input(s): "CKTOTAL", "CKMB", "CKMBINDEX", "TROPONINI" in the last 168 hours. BNP: Invalid input(s): "POCBNP" CBG: Recent Labs  Lab 09/15/21 1204 09/15/21 1710 09/15/21 2138 09/16/21 0727 09/16/21 1150  GLUCAP 422* 413* 388* 284* 314*   D-Dimer No results for input(s): "DDIMER" in  the last 72 hours. Hgb A1c Recent Labs    09/14/21 0845  HGBA1C 11.9*   Lipid Profile No results for input(s): "CHOL", "HDL", "LDLCALC", "TRIG", "CHOLHDL", "LDLDIRECT" in the last 72 hours. Thyroid function studies Recent Labs    09/14/21 0845  TSH 0.829   Anemia work up Recent Labs    09/14/21 0259 09/14/21 0845  VITAMINB12  --  727  FOLATE  --  6.3  FERRITIN  --  28  TIBC  --  294  IRON  --  15*  RETICCTPCT 3.0  --    Microbiology No results found for this or any previous visit (from the past 240 hour(s)).   Discharge Instructions:   Discharge Instructions     Diet - low sodium heart healthy   Complete by: As directed    Diet Carb Modified   Complete by: As directed    Discharge instructions   Complete by: As directed    Hold your BP medications (cozaar) as your Cr is slight up still.  Suspect your PCP can resume once you Cr normalized.  In replacement of cozaar will be using hydralazine in the meantime (do not think this will be a long term medication) Have sent a message to your cardiologist regarding the plavix   Increase activity slowly   Complete by: As directed       Allergies as of 09/16/2021       Reactions   Statins Other (See Comments)   Pt says they make him "mean"   Hydrocortisone Nausea Only   Ranexa [ranolazine] Other (See Comments)   Severe weakness/near syncope after 1 dose hallucinations   Azithromycin    Raises blood sugar   Miralax [polyethylene Glycol] Nausea And Vomiting   Patient refuses to take- reminded me today of this. ta   Metolazone Other (See Comments)   Drained, no energy        Medication List     STOP taking these medications    glipiZIDE 10 MG tablet Commonly known as: GLUCOTROL   losartan 100 MG tablet Commonly known as: COZAAR   potassium chloride 10 MEQ CR capsule Commonly known as: MICRO-K   rosuvastatin 20 MG tablet Commonly known as: CRESTOR       TAKE these medications    acetaminophen  325 MG tablet Commonly known as: TYLENOL Take 2 tablets (650 mg total) by mouth every 6 (six) hours. What changed:  medication strength how much to take when to take this reasons to take this   Albuterol Sulfate 108 (90 Base) MCG/ACT Aepb Commonly known as: ProAir RespiClick Inhale 2 puffs into the lungs every 6 (six) hours as needed. What changed: reasons to take this   albuterol (2.5 MG/3ML) 0.083% nebulizer solution Commonly known as: PROVENTIL Take 3 mLs (2.5 mg total) by nebulization every  6 (six) hours as needed for wheezing or shortness of breath. Dx Code D86.0 What changed: Another medication with the same name was changed. Make sure you understand how and when to take each.   aspirin EC 81 MG tablet Take 81 mg by mouth every evening.   clopidogrel 75 MG tablet Commonly known as: PLAVIX Take 1 tablet (75 mg total) by mouth daily.   CoQ-10 400 MG Caps Take 400 mg by mouth 3 (three) times a week.   Droplet Insulin Syringe 31G X 5/16" 1 ML Misc Generic drug: Insulin Syringe-Needle U-100   ezetimibe 10 MG tablet Commonly known as: ZETIA Take 10 mg by mouth once a week. Sunday   furosemide 80 MG tablet Commonly known as: LASIX Take 1 tablet (80 mg total) by mouth daily as needed for edema. What changed:  when to take this reasons to take this   gabapentin 100 MG capsule Commonly known as: NEURONTIN Take 1 capsule (100 mg total) by mouth 3 (three) times daily.   hydrALAZINE 10 MG tablet Commonly known as: APRESOLINE Take 1 tablet (10 mg total) by mouth every 8 (eight) hours.   insulin aspart protamine- aspart (70-30) 100 UNIT/ML injection Commonly known as: NOVOLOG MIX 70/30 Inject 0.4 mLs (40 Units total) into the skin with breakfast, with lunch, and with evening meal. What changed:  how much to take when to take this   levothyroxine 75 MCG tablet Commonly known as: SYNTHROID Take 75 mcg by mouth daily.   lidocaine 5 % Commonly known as:  LIDODERM Place 1 patch onto the skin daily as needed for pain.   montelukast 10 MG tablet Commonly known as: SINGULAIR Take 10 mg by mouth daily as needed (allergies).   multivitamin with minerals Tabs tablet Take 1 tablet by mouth daily.   nitroGLYCERIN 0.4 MG SL tablet Commonly known as: NITROSTAT Place 1 tablet (0.4 mg total) under the tongue every 5 (five) minutes as needed for chest pain.   OVER THE COUNTER MEDICATION Take 1 tablet by mouth daily. Omaga XL   oxyCODONE 5 MG immediate release tablet Commonly known as: Oxy IR/ROXICODONE Take 1 tablet (5 mg total) by mouth every 6 (six) hours as needed for moderate pain.   predniSONE 10 MG tablet Commonly known as: DELTASONE '40mg'$  x 3 days then '30mg'$  x 3 days then 20 mg x 3 days then 10 mg daily Start taking on: September 17, 2021 What changed:  how much to take how to take this when to take this additional instructions   RABEprazole 20 MG tablet Commonly known as: ACIPHEX Take 20 mg by mouth daily as needed (for acid reflux).   senna-docusate 8.6-50 MG tablet Commonly known as: Senokot-S Take 1 tablet by mouth 2 (two) times daily as needed for moderate constipation.   Vitamin D3 75 MCG (3000 UT) Tabs Take 3,000 Units by mouth once a week. Sunday        Follow-up Information     [facility] Follow up.   Why: [facility] will provide PT and OT in the home.        Judith Part, MD. Schedule an appointment as soon as possible for a visit in 1 week(s).   Specialty: Neurosurgery Contact information: [place] [address] [phone]         Shirline Frees, MD Follow up in 1 week(s).   Specialty: Family Medicine Why: repeat BMP Contact information: [place]  [place]         Jerline Pain, MD .   Specialty: Cardiology Contact information: [phone] [address] [place] [address] [phone]                   Time coordinating discharge: 45 min  Signed:  Geradine Girt DO  [facility] 09/16/2021, 12:07 PM

## 2021-09-16 NOTE — Plan of Care (Signed)

## 2021-09-16 NOTE — Discharge Instructions (Signed)
While on the steroids, you will need more insulin-- adjust your 70/30

## 2021-09-16 NOTE — Progress Notes (Signed)
Provided discharge education/instructions, all questions and concerns addressed. Pt not ina cute distress, discharged home with belongings accompanied by wife. 

## 2021-09-17 DIAGNOSIS — M4316 Spondylolisthesis, lumbar region: Secondary | ICD-10-CM | POA: Diagnosis not present

## 2021-09-17 DIAGNOSIS — Z6830 Body mass index (BMI) 30.0-30.9, adult: Secondary | ICD-10-CM | POA: Diagnosis not present

## 2021-09-17 DIAGNOSIS — M5416 Radiculopathy, lumbar region: Secondary | ICD-10-CM | POA: Diagnosis not present

## 2021-09-21 ENCOUNTER — Ambulatory Visit: Payer: Self-pay

## 2021-09-21 ENCOUNTER — Other Ambulatory Visit: Payer: Self-pay

## 2021-09-21 NOTE — Patient Outreach (Addendum)
[facility]) Care Management  09/21/2021  HORRACE HANAK Feb 18, 1946 [id number]   Patient request call back from [facility] call.  Patient had questions about his gabapentin stating that he was taking more than 100 mg prior to hospitalization and needs more.  Asked patient has he talked with his doctor.  He states that he will see him tomorrow for follow up and discuss at that time.    Discussed [facility] care management. Patient declined services and states he has no more needs at this time.  He declined ongoing services.    Plan: RN CM will close case.  RN CM will send letter for future reference.    Jone Baseman, RN, MSN [facility] Care Management Care Management Coordinator Direct Line [phone] Toll Free: [phone]  Fax: [phone]

## 2021-09-22 ENCOUNTER — Encounter (HOSPITAL_COMMUNITY): Payer: Self-pay | Admitting: Emergency Medicine

## 2021-09-22 ENCOUNTER — Inpatient Hospital Stay (HOSPITAL_COMMUNITY)
Admission: EM | Admit: 2021-09-22 | Discharge: 2021-09-25 | DRG: 460 | Disposition: A | Discharge status: Home / Self Care | Payer: Medicare HMO | Attending: Internal Medicine | Admitting: Internal Medicine

## 2021-09-22 DIAGNOSIS — Z981 Arthrodesis status: Secondary | ICD-10-CM | POA: Diagnosis not present

## 2021-09-22 DIAGNOSIS — Z841 Family history of disorders of kidney and ureter: Secondary | ICD-10-CM | POA: Diagnosis not present

## 2021-09-22 DIAGNOSIS — Z7984 Long term (current) use of oral hypoglycemic drugs: Secondary | ICD-10-CM | POA: Diagnosis not present

## 2021-09-22 DIAGNOSIS — D860 Sarcoidosis of lung: Secondary | ICD-10-CM | POA: Diagnosis present

## 2021-09-22 DIAGNOSIS — D72829 Elevated white blood cell count, unspecified: Secondary | ICD-10-CM | POA: Diagnosis present

## 2021-09-22 DIAGNOSIS — M5126 Other intervertebral disc displacement, lumbar region: Secondary | ICD-10-CM | POA: Diagnosis present

## 2021-09-22 DIAGNOSIS — N1831 Chronic kidney disease, stage 3a: Secondary | ICD-10-CM | POA: Diagnosis present

## 2021-09-22 DIAGNOSIS — K219 Gastro-esophageal reflux disease without esophagitis: Secondary | ICD-10-CM | POA: Diagnosis present

## 2021-09-22 DIAGNOSIS — Z79890 Hormone replacement therapy (postmenopausal): Secondary | ICD-10-CM

## 2021-09-22 DIAGNOSIS — M4316 Spondylolisthesis, lumbar region: Secondary | ICD-10-CM | POA: Diagnosis present

## 2021-09-22 DIAGNOSIS — Z833 Family history of diabetes mellitus: Secondary | ICD-10-CM | POA: Diagnosis not present

## 2021-09-22 DIAGNOSIS — E039 Hypothyroidism, unspecified: Secondary | ICD-10-CM | POA: Diagnosis present

## 2021-09-22 DIAGNOSIS — D869 Sarcoidosis, unspecified: Secondary | ICD-10-CM | POA: Diagnosis not present

## 2021-09-22 DIAGNOSIS — Z888 Allergy status to other drugs, medicaments and biological substances status: Secondary | ICD-10-CM

## 2021-09-22 DIAGNOSIS — E1151 Type 2 diabetes mellitus with diabetic peripheral angiopathy without gangrene: Secondary | ICD-10-CM | POA: Diagnosis present

## 2021-09-22 DIAGNOSIS — I959 Hypotension, unspecified: Secondary | ICD-10-CM | POA: Diagnosis present

## 2021-09-22 DIAGNOSIS — I493 Ventricular premature depolarization: Secondary | ICD-10-CM | POA: Diagnosis present

## 2021-09-22 DIAGNOSIS — G4733 Obstructive sleep apnea (adult) (pediatric): Secondary | ICD-10-CM | POA: Diagnosis present

## 2021-09-22 DIAGNOSIS — G9340 Encephalopathy, unspecified: Secondary | ICD-10-CM | POA: Diagnosis not present

## 2021-09-22 DIAGNOSIS — E785 Hyperlipidemia, unspecified: Secondary | ICD-10-CM | POA: Diagnosis present

## 2021-09-22 DIAGNOSIS — E875 Hyperkalemia: Secondary | ICD-10-CM | POA: Diagnosis present

## 2021-09-22 DIAGNOSIS — E2740 Unspecified adrenocortical insufficiency: Secondary | ICD-10-CM | POA: Diagnosis present

## 2021-09-22 DIAGNOSIS — Z794 Long term (current) use of insulin: Secondary | ICD-10-CM

## 2021-09-22 DIAGNOSIS — Z955 Presence of coronary angioplasty implant and graft: Secondary | ICD-10-CM

## 2021-09-22 DIAGNOSIS — M549 Dorsalgia, unspecified: Secondary | ICD-10-CM

## 2021-09-22 DIAGNOSIS — N179 Acute kidney failure, unspecified: Secondary | ICD-10-CM | POA: Diagnosis not present

## 2021-09-22 DIAGNOSIS — Z09 Encounter for follow-up examination after completed treatment for conditions other than malignant neoplasm: Secondary | ICD-10-CM | POA: Diagnosis not present

## 2021-09-22 DIAGNOSIS — Z886 Allergy status to analgesic agent status: Secondary | ICD-10-CM

## 2021-09-22 DIAGNOSIS — Z7902 Long term (current) use of antithrombotics/antiplatelets: Secondary | ICD-10-CM

## 2021-09-22 DIAGNOSIS — E1122 Type 2 diabetes mellitus with diabetic chronic kidney disease: Secondary | ICD-10-CM | POA: Diagnosis present

## 2021-09-22 DIAGNOSIS — Z9989 Dependence on other enabling machines and devices: Secondary | ICD-10-CM | POA: Diagnosis not present

## 2021-09-22 DIAGNOSIS — M5441 Lumbago with sciatica, right side: Secondary | ICD-10-CM | POA: Diagnosis not present

## 2021-09-22 DIAGNOSIS — E860 Dehydration: Secondary | ICD-10-CM | POA: Diagnosis present

## 2021-09-22 DIAGNOSIS — I700 Atherosclerosis of aorta: Secondary | ICD-10-CM | POA: Diagnosis not present

## 2021-09-22 DIAGNOSIS — I2510 Atherosclerotic heart disease of native coronary artery without angina pectoris: Secondary | ICD-10-CM | POA: Diagnosis present

## 2021-09-22 DIAGNOSIS — Z8673 Personal history of transient ischemic attack (TIA), and cerebral infarction without residual deficits: Secondary | ICD-10-CM

## 2021-09-22 DIAGNOSIS — E1165 Type 2 diabetes mellitus with hyperglycemia: Secondary | ICD-10-CM

## 2021-09-22 DIAGNOSIS — M4800 Spinal stenosis, site unspecified: Secondary | ICD-10-CM | POA: Diagnosis present

## 2021-09-22 DIAGNOSIS — Z7952 Long term (current) use of systemic steroids: Secondary | ICD-10-CM

## 2021-09-22 DIAGNOSIS — R609 Edema, unspecified: Secondary | ICD-10-CM | POA: Diagnosis not present

## 2021-09-22 DIAGNOSIS — Z8051 Family history of malignant neoplasm of kidney: Secondary | ICD-10-CM

## 2021-09-22 DIAGNOSIS — M5116 Intervertebral disc disorders with radiculopathy, lumbar region: Secondary | ICD-10-CM | POA: Diagnosis present

## 2021-09-22 DIAGNOSIS — Z825 Family history of asthma and other chronic lower respiratory diseases: Secondary | ICD-10-CM | POA: Diagnosis not present

## 2021-09-22 DIAGNOSIS — Z7982 Long term (current) use of aspirin: Secondary | ICD-10-CM

## 2021-09-22 DIAGNOSIS — Z8616 Personal history of COVID-19: Secondary | ICD-10-CM

## 2021-09-22 DIAGNOSIS — Z79899 Other long term (current) drug therapy: Secondary | ICD-10-CM

## 2021-09-22 DIAGNOSIS — M545 Low back pain, unspecified: Secondary | ICD-10-CM | POA: Diagnosis not present

## 2021-09-22 DIAGNOSIS — G8929 Other chronic pain: Principal | ICD-10-CM

## 2021-09-22 DIAGNOSIS — I130 Hypertensive heart and chronic kidney disease with heart failure and stage 1 through stage 4 chronic kidney disease, or unspecified chronic kidney disease: Secondary | ICD-10-CM | POA: Diagnosis present

## 2021-09-22 DIAGNOSIS — I10 Essential (primary) hypertension: Secondary | ICD-10-CM | POA: Diagnosis not present

## 2021-09-22 DIAGNOSIS — Z8249 Family history of ischemic heart disease and other diseases of the circulatory system: Secondary | ICD-10-CM

## 2021-09-22 DIAGNOSIS — M4326 Fusion of spine, lumbar region: Secondary | ICD-10-CM | POA: Diagnosis not present

## 2021-09-22 DIAGNOSIS — D500 Iron deficiency anemia secondary to blood loss (chronic): Secondary | ICD-10-CM | POA: Diagnosis present

## 2021-09-22 DIAGNOSIS — K5900 Constipation, unspecified: Secondary | ICD-10-CM | POA: Diagnosis present

## 2021-09-22 DIAGNOSIS — Z881 Allergy status to other antibiotic agents status: Secondary | ICD-10-CM

## 2021-09-22 DIAGNOSIS — M5416 Radiculopathy, lumbar region: Secondary | ICD-10-CM | POA: Diagnosis not present

## 2021-09-22 DIAGNOSIS — E1142 Type 2 diabetes mellitus with diabetic polyneuropathy: Secondary | ICD-10-CM | POA: Diagnosis not present

## 2021-09-22 DIAGNOSIS — R000 Tachycardia, unspecified: Secondary | ICD-10-CM | POA: Diagnosis not present

## 2021-09-22 DIAGNOSIS — Z885 Allergy status to narcotic agent status: Secondary | ICD-10-CM

## 2021-09-22 DIAGNOSIS — Z01810 Encounter for preprocedural cardiovascular examination: Secondary | ICD-10-CM | POA: Diagnosis not present

## 2021-09-22 DIAGNOSIS — I5032 Chronic diastolic (congestive) heart failure: Secondary | ICD-10-CM | POA: Diagnosis present

## 2021-09-22 LAB — COMPREHENSIVE METABOLIC PANEL
ALT: 28 U/L (ref 0–44)
AST: 24 U/L (ref 15–41)
Albumin: 3.2 g/dL — ABNORMAL LOW (ref 3.5–5.0)
Alkaline Phosphatase: 52 U/L (ref 38–126)
Anion gap: 13 (ref 5–15)
BUN: 20 mg/dL (ref 8–23)
CO2: 26 mmol/L (ref 22–32)
Calcium: 9 mg/dL (ref 8.9–10.3)
Chloride: 99 mmol/L (ref 98–111)
Creatinine, Ser: 1.85 mg/dL — ABNORMAL HIGH (ref 0.61–1.24)
GFR, Estimated: 38 mL/min — ABNORMAL LOW (ref >60)
Glucose, Bld: 191 mg/dL — ABNORMAL HIGH (ref 70–99)
Potassium: 4 mmol/L (ref 3.5–5.1)
Sodium: 138 mmol/L (ref 135–145)
Total Bilirubin: 1 mg/dL (ref 0.3–1.2)
Total Protein: 6.1 g/dL — ABNORMAL LOW (ref 6.5–8.1)

## 2021-09-22 LAB — CBC WITH DIFFERENTIAL/PLATELET
Abs Immature Granulocytes: 0.3 10*3/uL — ABNORMAL HIGH (ref 0.00–0.07)
Basophils Absolute: 0 10*3/uL (ref 0.0–0.1)
Basophils Relative: 0 %
Eosinophils Absolute: 0.2 10*3/uL (ref 0.0–0.5)
Eosinophils Relative: 1 %
HCT: 43.6 % (ref 39.0–52.0)
Hemoglobin: 12.5 g/dL — ABNORMAL LOW (ref 13.0–17.0)
Lymphocytes Relative: 18 %
Lymphs Abs: 3.1 10*3/uL (ref 0.7–4.0)
MCH: 25.1 pg — ABNORMAL LOW (ref 26.0–34.0)
MCHC: 28.7 g/dL — ABNORMAL LOW (ref 30.0–36.0)
MCV: 87.6 fL (ref 80.0–100.0)
Monocytes Absolute: 0.5 10*3/uL (ref 0.1–1.0)
Monocytes Relative: 3 %
Myelocytes: 2 %
Neutro Abs: 13 10*3/uL — ABNORMAL HIGH (ref 1.7–7.7)
Neutrophils Relative %: 76 %
Platelets: 414 10*3/uL — ABNORMAL HIGH (ref 150–400)
RBC: 4.98 MIL/uL (ref 4.22–5.81)
RDW: 20.9 % — ABNORMAL HIGH (ref 11.5–15.5)
WBC: 17.1 10*3/uL — ABNORMAL HIGH (ref 4.0–10.5)
nRBC: 0.2 % (ref 0.0–0.2)
nRBC: 1 /100 WBC — ABNORMAL HIGH

## 2021-09-22 MED ORDER — LACTATED RINGERS IV BOLUS
1000.0000 mL | Freq: Once | INTRAVENOUS | Status: AC
  Administered 2021-09-23: 1000 mL via INTRAVENOUS

## 2021-09-22 NOTE — ED Provider Triage Note (Addendum)
Emergency Medicine Provider Triage Evaluation Note  [name] , a 76 y.o. male  was evaluated in triage.  Pt complains of low blood pressure, in dehydration.  He has a history of acute anterolisthesis after an incident earlier this month.  He was sent by his primary care as well as his neurosurgeon to the hospital after a low blood pressure reading.  He has a positive Hemoccult from last month.  He states he is unsure whether or not he is still bleeding from his rectum but denies any other forms of blood or fluid loss.  He states he felt dizzy and as if he was going to pass out today denies fever, chills, night sweats, chest pain, shortness of breath, abdominal pain, nausea/vomiting/diarrhea, urinary symptoms, change in bowel habits.  He denies any new symptoms related to this injury but states that his back pain has gotten worse.  He states he was sent by his PCP for admission for rehydration in order for him to be operated on on Friday.  Review of Systems  Positive: See above Negative:   Physical Exam  BP 93/63 (BP Location: Right Arm)   Pulse 100   Temp (!) 97.5 F (36.4 C) (Oral)   Resp 19   SpO2 99%  Gen:   Awake, no distress   Resp:  Normal effort  MSK:   Moves extremities without difficulty  Other:    Medical Decision Making  Medically screening exam initiated at 4:43 PM.  Appropriate orders placed.  [name] was informed that the remainder of the evaluation will be completed by another provider, this initial triage assessment does not replace that evaluation, and the importance of remaining in the ED until their evaluation is complete.     Wilnette Kales, [place] 09/22/21 [place], [place], [place] 09/22/21 [phone]

## 2021-09-22 NOTE — ED Triage Notes (Signed)
Patient sent to ED for evaluation of hypotension and dehydration, scheduled back surgery for Friday due to disc herniation. Patient is alert, oriented, and in no apparent distress.

## 2021-09-22 NOTE — ED Notes (Signed)
Unable to obtain labs 

## 2021-09-23 ENCOUNTER — Other Ambulatory Visit: Payer: Self-pay

## 2021-09-23 ENCOUNTER — Encounter (HOSPITAL_COMMUNITY): Payer: Self-pay | Admitting: Internal Medicine

## 2021-09-23 DIAGNOSIS — E785 Hyperlipidemia, unspecified: Secondary | ICD-10-CM | POA: Diagnosis present

## 2021-09-23 DIAGNOSIS — M5416 Radiculopathy, lumbar region: Secondary | ICD-10-CM | POA: Diagnosis not present

## 2021-09-23 DIAGNOSIS — D500 Iron deficiency anemia secondary to blood loss (chronic): Secondary | ICD-10-CM | POA: Diagnosis present

## 2021-09-23 DIAGNOSIS — M545 Low back pain, unspecified: Secondary | ICD-10-CM | POA: Diagnosis not present

## 2021-09-23 DIAGNOSIS — G4733 Obstructive sleep apnea (adult) (pediatric): Secondary | ICD-10-CM | POA: Diagnosis present

## 2021-09-23 DIAGNOSIS — E1122 Type 2 diabetes mellitus with diabetic chronic kidney disease: Secondary | ICD-10-CM | POA: Diagnosis present

## 2021-09-23 DIAGNOSIS — M5126 Other intervertebral disc displacement, lumbar region: Secondary | ICD-10-CM | POA: Diagnosis present

## 2021-09-23 DIAGNOSIS — I959 Hypotension, unspecified: Secondary | ICD-10-CM | POA: Diagnosis present

## 2021-09-23 DIAGNOSIS — D860 Sarcoidosis of lung: Secondary | ICD-10-CM | POA: Diagnosis present

## 2021-09-23 DIAGNOSIS — N1831 Chronic kidney disease, stage 3a: Secondary | ICD-10-CM | POA: Diagnosis present

## 2021-09-23 DIAGNOSIS — E860 Dehydration: Secondary | ICD-10-CM | POA: Diagnosis present

## 2021-09-23 DIAGNOSIS — Z833 Family history of diabetes mellitus: Secondary | ICD-10-CM | POA: Diagnosis not present

## 2021-09-23 DIAGNOSIS — I2510 Atherosclerotic heart disease of native coronary artery without angina pectoris: Secondary | ICD-10-CM | POA: Diagnosis present

## 2021-09-23 DIAGNOSIS — E875 Hyperkalemia: Secondary | ICD-10-CM | POA: Diagnosis present

## 2021-09-23 DIAGNOSIS — E039 Hypothyroidism, unspecified: Secondary | ICD-10-CM

## 2021-09-23 DIAGNOSIS — Z8616 Personal history of COVID-19: Secondary | ICD-10-CM | POA: Diagnosis not present

## 2021-09-23 DIAGNOSIS — Z825 Family history of asthma and other chronic lower respiratory diseases: Secondary | ICD-10-CM | POA: Diagnosis not present

## 2021-09-23 DIAGNOSIS — D869 Sarcoidosis, unspecified: Secondary | ICD-10-CM

## 2021-09-23 DIAGNOSIS — E2740 Unspecified adrenocortical insufficiency: Secondary | ICD-10-CM | POA: Diagnosis present

## 2021-09-23 DIAGNOSIS — D72829 Elevated white blood cell count, unspecified: Secondary | ICD-10-CM | POA: Diagnosis present

## 2021-09-23 DIAGNOSIS — I5032 Chronic diastolic (congestive) heart failure: Secondary | ICD-10-CM | POA: Diagnosis present

## 2021-09-23 DIAGNOSIS — Z7984 Long term (current) use of oral hypoglycemic drugs: Secondary | ICD-10-CM | POA: Diagnosis not present

## 2021-09-23 DIAGNOSIS — M549 Dorsalgia, unspecified: Secondary | ICD-10-CM | POA: Diagnosis present

## 2021-09-23 DIAGNOSIS — M5116 Intervertebral disc disorders with radiculopathy, lumbar region: Secondary | ICD-10-CM | POA: Diagnosis present

## 2021-09-23 DIAGNOSIS — M4800 Spinal stenosis, site unspecified: Secondary | ICD-10-CM | POA: Diagnosis present

## 2021-09-23 DIAGNOSIS — M4316 Spondylolisthesis, lumbar region: Secondary | ICD-10-CM | POA: Diagnosis present

## 2021-09-23 DIAGNOSIS — I130 Hypertensive heart and chronic kidney disease with heart failure and stage 1 through stage 4 chronic kidney disease, or unspecified chronic kidney disease: Secondary | ICD-10-CM | POA: Diagnosis present

## 2021-09-23 DIAGNOSIS — E1165 Type 2 diabetes mellitus with hyperglycemia: Secondary | ICD-10-CM

## 2021-09-23 DIAGNOSIS — I493 Ventricular premature depolarization: Secondary | ICD-10-CM | POA: Diagnosis present

## 2021-09-23 DIAGNOSIS — E1151 Type 2 diabetes mellitus with diabetic peripheral angiopathy without gangrene: Secondary | ICD-10-CM | POA: Diagnosis not present

## 2021-09-23 DIAGNOSIS — Z794 Long term (current) use of insulin: Secondary | ICD-10-CM

## 2021-09-23 DIAGNOSIS — Z01810 Encounter for preprocedural cardiovascular examination: Secondary | ICD-10-CM

## 2021-09-23 DIAGNOSIS — Z841 Family history of disorders of kidney and ureter: Secondary | ICD-10-CM | POA: Diagnosis not present

## 2021-09-23 LAB — CBG MONITORING, ED
Glucose-Capillary: 324 mg/dL — ABNORMAL HIGH (ref 70–99)
Glucose-Capillary: 306 mg/dL — ABNORMAL HIGH (ref 70–99)
Glucose-Capillary: 336 mg/dL — ABNORMAL HIGH (ref 70–99)
Glucose-Capillary: 397 mg/dL — ABNORMAL HIGH (ref 70–99)

## 2021-09-23 LAB — LACTIC ACID, PLASMA: Lactic Acid, Venous: 1.5 mmol/L (ref 0.5–1.9)

## 2021-09-23 LAB — TYPE AND SCREEN
ABO/RH(D): A POS
Antibody Screen: NEGATIVE

## 2021-09-23 MED ORDER — ONDANSETRON HCL 4 MG PO TABS: 4.0000 mg | ORAL_TABLET | Freq: Four times a day (QID) | ORAL | Status: DC | PRN

## 2021-09-23 MED ORDER — ACETAMINOPHEN 325 MG PO TABS
650.0000 mg | ORAL_TABLET | Freq: Four times a day (QID) | ORAL | Status: DC | PRN
  Administered 2021-09-23: 650 mg via ORAL
  Filled 2021-09-23: qty 2

## 2021-09-23 MED ORDER — DOCUSATE SODIUM 100 MG PO CAPS
100.0000 mg | ORAL_CAPSULE | Freq: Two times a day (BID) | ORAL | Status: DC
  Administered 2021-09-24 – 2021-09-25 (×3): 100 mg via ORAL
  Filled 2021-09-23 (×4): qty 1

## 2021-09-23 MED ORDER — LEVOTHYROXINE SODIUM 75 MCG PO TABS
75.0000 ug | ORAL_TABLET | Freq: Every day | ORAL | Status: DC
  Administered 2021-09-25: 75 ug via ORAL
  Filled 2021-09-23: qty 1

## 2021-09-23 MED ORDER — ROSUVASTATIN CALCIUM 20 MG PO TABS
20.0000 mg | ORAL_TABLET | Freq: Every day | ORAL | Status: DC
  Administered 2021-09-23 – 2021-09-25 (×3): 20 mg via ORAL
  Filled 2021-09-23 (×3): qty 1

## 2021-09-23 MED ORDER — HYDRALAZINE HCL 20 MG/ML IJ SOLN: 5.0000 mg | INTRAMUSCULAR | Status: DC | PRN

## 2021-09-23 MED ORDER — ONDANSETRON HCL 4 MG/2ML IJ SOLN: 4.0000 mg | Freq: Four times a day (QID) | INTRAMUSCULAR | Status: DC | PRN

## 2021-09-23 MED ORDER — PREDNISONE 20 MG PO TABS
40.0000 mg | ORAL_TABLET | Freq: Every day | ORAL | Status: AC
Start: 2021-09-23 — End: 2021-09-25
  Administered 2021-09-23 – 2021-09-25 (×3): 40 mg via ORAL
  Filled 2021-09-23 (×3): qty 2

## 2021-09-23 MED ORDER — LACTATED RINGERS IV SOLN: INTRAVENOUS | Status: DC

## 2021-09-23 MED ORDER — GABAPENTIN 100 MG PO CAPS
100.0000 mg | ORAL_CAPSULE | Freq: Three times a day (TID) | ORAL | Status: DC
  Administered 2021-09-23 – 2021-09-25 (×7): 100 mg via ORAL
  Filled 2021-09-23 (×7): qty 1

## 2021-09-23 MED ORDER — ALBUTEROL SULFATE (2.5 MG/3ML) 0.083% IN NEBU: 2.5000 mg | INHALATION_SOLUTION | Freq: Four times a day (QID) | RESPIRATORY_TRACT | Status: DC | PRN

## 2021-09-23 MED ORDER — METHOCARBAMOL 1000 MG/10ML IJ SOLN: 500.0000 mg | Freq: Four times a day (QID) | INTRAVENOUS | Status: DC | PRN

## 2021-09-23 MED ORDER — INSULIN ASPART 100 UNIT/ML IJ SOLN
0.0000 [IU] | Freq: Every day | INTRAMUSCULAR | Status: DC
  Administered 2021-09-23: 5 [IU] via SUBCUTANEOUS

## 2021-09-23 MED ORDER — PREDNISONE 5 MG PO TABS: 10.0000 mg | ORAL_TABLET | Freq: Every day | ORAL | Status: DC

## 2021-09-23 MED ORDER — INSULIN ASPART PROT & ASPART (70-30 MIX) 100 UNIT/ML ~~LOC~~ SUSP
40.0000 [IU] | Freq: Three times a day (TID) | SUBCUTANEOUS | Status: DC
  Administered 2021-09-23 – 2021-09-25 (×5): 40 [IU] via SUBCUTANEOUS
  Filled 2021-09-23 (×2): qty 10

## 2021-09-23 MED ORDER — PREDNISONE 5 MG PO TABS
10.0000 mg | ORAL_TABLET | Freq: Every day | ORAL | Status: DC
Start: 2021-10-02 — End: 2021-09-26

## 2021-09-23 MED ORDER — PREDNISONE 5 MG PO TABS: 30.0000 mg | ORAL_TABLET | Freq: Every day | ORAL | Status: DC

## 2021-09-23 MED ORDER — ASPIRIN 81 MG PO TBEC
81.0000 mg | DELAYED_RELEASE_TABLET | Freq: Every evening | ORAL | Status: DC
Start: 2021-09-23 — End: 2021-09-26
  Administered 2021-09-23 – 2021-09-25 (×3): 81 mg via ORAL
  Filled 2021-09-23 (×3): qty 1

## 2021-09-23 MED ORDER — PREDNISONE 20 MG PO TABS
20.0000 mg | ORAL_TABLET | Freq: Every day | ORAL | Status: DC
Start: 2021-09-29 — End: 2021-09-26

## 2021-09-23 MED ORDER — INSULIN ASPART 100 UNIT/ML IJ SOLN
0.0000 [IU] | Freq: Three times a day (TID) | INTRAMUSCULAR | Status: DC
  Administered 2021-09-23 (×2): 11 [IU] via SUBCUTANEOUS
  Administered 2021-09-24: 3 [IU] via SUBCUTANEOUS
  Administered 2021-09-24 (×2): 2 [IU] via SUBCUTANEOUS
  Administered 2021-09-25: 11 [IU] via SUBCUTANEOUS
  Administered 2021-09-25: 15 [IU] via SUBCUTANEOUS

## 2021-09-23 MED ORDER — HYDRALAZINE HCL 10 MG PO TABS
10.0000 mg | ORAL_TABLET | Freq: Three times a day (TID) | ORAL | Status: DC
  Administered 2021-09-23 (×2): 10 mg via ORAL
  Filled 2021-09-23 (×2): qty 1

## 2021-09-23 MED ORDER — MORPHINE SULFATE (PF) 2 MG/ML IV SOLN: 2.0000 mg | INTRAVENOUS | Status: DC | PRN

## 2021-09-23 MED ORDER — ACETAMINOPHEN 650 MG RE SUPP: 650.0000 mg | Freq: Four times a day (QID) | RECTAL | Status: DC | PRN

## 2021-09-23 MED ORDER — OXYCODONE HCL 5 MG PO TABS
5.0000 mg | ORAL_TABLET | ORAL | Status: DC | PRN
  Administered 2021-09-23 – 2021-09-25 (×5): 5 mg via ORAL
  Filled 2021-09-23 (×5): qty 1

## 2021-09-23 MED ORDER — FENTANYL CITRATE PF 50 MCG/ML IJ SOSY
50.0000 ug | PREFILLED_SYRINGE | Freq: Once | INTRAMUSCULAR | Status: AC
  Administered 2021-09-23: 50 ug via INTRAVENOUS
  Filled 2021-09-23: qty 1

## 2021-09-23 MED ORDER — BISACODYL 5 MG PO TBEC: 5.0000 mg | DELAYED_RELEASE_TABLET | Freq: Every day | ORAL | Status: DC | PRN

## 2021-09-23 NOTE — ED Notes (Signed)
Assisted pt to the bathroom with wheelchair to have a bowel movement, pt needed moderate assistance as he was slightly unsteady. Pt successful in BM, back in bed and resting comfortably at this time. Wife at bedside.

## 2021-09-23 NOTE — H&P (Signed)
History and Physical    Patient: [name] IFO:[id number] DOB: Nov 28, 1945 DOA: 09/22/2021 DOS: the patient was seen and examined on 09/23/2021 PCP: Shirline Frees, MD  Patient coming from: Home - lives with wife; NOK: Wife, Ali Mohl, [phone]   Chief Complaint: Back pain  HPI: [name] is a 76 y.o. male with medical history significant of adrenal insufficiency; stage 3 CKD; CAD s/p stents; DM; sarcoidosis; OSA on CPAP; and hypothyroidism presenting with back pain.  He was last admitted from 7/9-13 for intractable back pain from lumbar radiculopathy, provided with a steroid taper as per neurosurgery; he also had AMS from overmedication.   Patient reports that he has had chronic and progressive back pain for some time but it acutely worsened prior to his last hospitalization.  He was discharged on 7/13 and reports that he was seen in the neurosurgery office on 7/14 and was told he would be scheduled for surgery.  His pain and radiculopathy have worsened and he went to his PCP yesterday and was sent to the ER.  No clearly associated urinary or fecal symptoms.  Steroids have not helped.  He reports that he was told to come to the ER so that he could have surgery on 7/21.    ER Course:  Carryover, per Dr. Marlowe Sax:  31 history of CKD 3, CVA with stents, IDDM, OSA, sarcoidosis on prednisone, adrenal insufficiency, hypothyroidism, chronic low back pain with sciatica was recently admitted about 10 days ago for worsening back pain.  MRI done at that time showing    IMPRESSION:  Anterolisthesis, disc bulge and right foraminal disc extrusion at  L4-5 resulting in mild to moderate spinal canal stenosis and severe  bilateral neural foraminal narrowing, greater on the right side,  with impingement of the bilateral L4 nerve roots.  Neurosurgery was consulted and he was placed on a steroid taper.  He was not discharged on any opiates and his home blood pressure medication was held.  Discharged on  muscle relaxer and gabapentin.  Today he presents to the ED with uncontrolled back pain.  Hypotensive blood pressure 60/40 initially, now improved with IV fluids.  WBC 17.1 in the setting of steroid use, lactate normal.  No red flag symptoms.  Admitted for observation/pain control and neurosurgery will have to be consulted again.     Review of Systems: As mentioned in the history of present illness. All other systems reviewed and are negative. Past Medical History:  Diagnosis Date   Adrenal insufficiency ([place])    Allergy    Anemia    Arthritis    feet    Broken foot 09/2019   had to wore a boot. Left    CKD (chronic kidney disease), stage III (HCC)    Coronary artery disease    has stents   Coronary atherosclerosis of native coronary artery    Proximal LAD, posterior lateral stent widely patent-10/12/11   Diabetes mellitus    insulin and pills   Hearing loss    wears hearing aids   History of blood transfusion 06/30/2016   [facility] - 2 units transfused   Hypertension    OSA (obstructive sleep apnea)    uses VPAC sleep study 2 years done through [place]. Dr. Marlou Porch arranged study   Sarcoidosis    Sleep apnea    uses CPAP nightly   Thyroid disease    Type 2 diabetes mellitus [facility])    Past Surgical History:  Procedure Laterality Date   APPENDECTOMY  CATARACT EXTRACTION  2011   bilat   CHOLECYSTECTOMY  01/25/2011   Procedure: LAPAROSCOPIC CHOLECYSTECTOMY WITH INTRAOPERATIVE CHOLANGIOGRAM;  Surgeon: Judieth Keens, DO;  Location: [place];  Service: General;  Laterality: N/A;   COLONOSCOPY     several   COLONOSCOPY WITH PROPOFOL N/A 01/03/2020   Procedure: COLONOSCOPY WITH PROPOFOL;  Surgeon: Gatha Mayer, MD;  Location: [facility];  Service: Endoscopy;  Laterality: N/A;   CORONARY ANGIOPLASTY     most recent 11/2009   CORONARY STENT INTERVENTION N/A 08/07/2018   Procedure: CORONARY STENT INTERVENTION;  Surgeon: Jettie Booze, MD;  Location: [place] [facility];   Service: Cardiovascular;  Laterality: N/A;   CORONARY STENT INTERVENTION N/A 01/18/2021   Procedure: CORONARY STENT INTERVENTION;  Surgeon: Sherren Mocha, MD;  Location: [place] [facility];  Service: Cardiovascular;  Laterality: N/A;   CORONARY STENT PLACEMENT  2009   in LAD and side branch PTCA   ESOPHAGOGASTRODUODENOSCOPY (EGD) WITH PROPOFOL N/A 01/03/2020   Procedure: ESOPHAGOGASTRODUODENOSCOPY (EGD) WITH PROPOFOL;  Surgeon: Gatha Mayer, MD;  [facility]: [facility];  Service: Endoscopy;  Laterality: N/A;   HEMOSTASIS CLIP PLACEMENT  01/03/2020   Procedure: HEMOSTASIS CLIP PLACEMENT;  Surgeon: Gatha Mayer, MD;  Location: [facility];  Service: Endoscopy;;   INTERCOSTAL NERVE BLOCK  2011, 06/2016   x2. lumbar spine   INTRAVASCULAR PRESSURE WIRE/FFR STUDY N/A 01/18/2021   Procedure: INTRAVASCULAR PRESSURE WIRE/FFR STUDY;  Surgeon: Sherren Mocha, MD;  Location: [place] [facility];  Service: Cardiovascular;  Laterality: N/A;   LEFT HEART CATHETERIZATION WITH CORONARY ANGIOGRAM Bilateral 10/12/2011   Procedure: LEFT HEART CATHETERIZATION WITH CORONARY ANGIOGRAM;  Surgeon: Candee Furbish, MD;  Location: [facility] C[facility] LAB;  Service: Cardiovascular;  Laterality: Bilateral;   LEFT HEART CATHETERIZATION WITH CORONARY ANGIOGRAM N/A 04/01/2014   Procedure: LEFT HEART CATHETERIZATION WITH CORONARY ANGIOGRAM;  Surgeon: Candee Furbish, MD;  Location: [facility] C[facility] LAB;  Service: Cardiovascular;  Laterality: N/A;   POLYPECTOMY  01/03/2020   Procedure: POLYPECTOMY;  Surgeon: Gatha Mayer, MD;  [facility];  Service: Endoscopy;;   RIGHT/LEFT HEART CATH AND CORONARY ANGIOGRAPHY N/A 08/07/2018   Procedure: RIGHT/LEFT HEART CATH AND CORONARY ANGIOGRAPHY;  Surgeon: Jettie Booze, MD;  Location: [place] [facility];  Service: Cardiovascular;  Laterality: N/A;   RIGHT/LEFT HEART CATH AND CORONARY ANGIOGRAPHY N/A 12/25/2020   Procedure: RIGHT/LEFT HEART CATH AND CORONARY ANGIOGRAPHY;  Surgeon: Belva Crome, MD;  Location: [place] [facility];  Service: Cardiovascular;  Laterality: N/A;   Social History:  reports that he has never smoked. He has never used smokeless tobacco. He reports that he does not drink alcohol and does not use drugs.  Allergies  Allergen Reactions   Statins Other (See Comments)    Pt says they make him "mean"   Hydrocortisone Nausea Only   Ranexa [Ranolazine] Other (See Comments)    Severe weakness/near syncope after 1 dose hallucinations   Azithromycin     Raises blood sugar   Hydrocodone Nausea And Vomiting   Miralax [Polyethylene Glycol] Nausea And Vomiting   Metolazone Other (See Comments)    Drained, no energy    Family History  Problem Relation Age of Onset   Kidney disease Mother    Diabetes Mother    Kidney cancer Mother    Heart attack Father    Asthma Sister    Anesthesia problems Sister        "Kidney's did not wake up"   Sarcoidosis Sister  Sarcoidosis Niece    Colon cancer Neg Hx    Colon polyps Neg Hx    Rectal cancer Neg Hx    Stomach cancer Neg Hx     Prior to Admission medications   Medication Sig Start Date End Date Taking? Authorizing Provider  acetaminophen (TYLENOL) 325 MG tablet Take 2 tablets (650 mg total) by mouth every 6 (six) hours. 09/16/21   Geradine Girt, DO  albuterol (PROVENTIL) (2.5 MG/3ML) 0.083% nebulizer solution Take 3 mLs (2.5 mg total) by nebulization every 6 (six) hours as needed for wheezing or shortness of breath. Dx Code D86.0 07/24/14   Elsie Stain, MD  Albuterol Sulfate (PROAIR RESPICLICK) 409 (90 BASE) MCG/ACT AEPB Inhale 2 puffs into the lungs every 6 (six) hours as needed. Patient taking differently: Inhale 2 puffs into the lungs every 6 (six) hours as needed (for shortness of breath). 07/24/14   Elsie Stain, MD  aspirin EC 81 MG tablet Take 81 mg by mouth every evening.     [provider]  Cholecalciferol (VITAMIN D3) 3000 UNITS TABS Take 3,000 Units by mouth once a week. Sunday     [provider]  clopidogrel (PLAVIX) 75 MG tablet Take 1 tablet (75 mg total) by mouth daily. 04/17/14   Jerline Pain, MD  Coenzyme Q10 (COQ-10) 400 MG CAPS Take 400 mg by mouth 3 (three) times a week.    [provider]  DROPLET INSULIN SYRINGE 31G X 5/16" 1 ML [place]  08/14/18   [provider]  ezetimibe (ZETIA) 10 MG tablet Take 10 mg by mouth once a week. Sunday 06/25/21   [provider]  furosemide (LASIX) 80 MG tablet Take 1 tablet (80 mg total) by mouth daily as needed for edema. 09/16/21   Geradine Girt, DO  gabapentin (NEURONTIN) 100 MG capsule Take 1 capsule (100 mg total) by mouth 3 (three) times daily. 09/16/21   Geradine Girt, DO  hydrALAZINE (APRESOLINE) 10 MG tablet Take 1 tablet (10 mg total) by mouth every 8 (eight) hours. 09/16/21   Geradine Girt, DO  insulin aspart protamine- aspart (NOVOLOG MIX 70/30) (70-30) 100 UNIT/ML injection Inject 0.4 mLs (40 Units total) into the skin with breakfast, with lunch, and with evening meal. 09/16/21   Geradine Girt, DO  levothyroxine (SYNTHROID) 75 MCG tablet Take 75 mcg by mouth daily. 02/24/21   [provider]  lidocaine (LIDODERM) 5 % Place 1 patch onto the skin daily as needed for pain. 10/21/20   [provider]  montelukast (SINGULAIR) 10 MG tablet Take 10 mg by mouth daily as needed (allergies).  07/18/18   [provider]  Multiple Vitamin (MULTIVITAMIN WITH MINERALS) TABS tablet Take 1 tablet by mouth daily. 10/08/18   Hongalgi, Lenis Dickinson, MD  nitroGLYCERIN (NITROSTAT) 0.4 MG SL tablet Place 1 tablet (0.4 mg total) under the tongue every 5 (five) minutes as needed for chest pain. 07/01/20 04/27/21  Imogene Burn, PA-C  OVER THE COUNTER MEDICATION Take 1 tablet by mouth daily. [place] XL    [provider]  oxyCODONE (OXY IR/ROXICODONE) 5 MG immediate release tablet Take 1 tablet (5 mg total) by mouth every 6 (six) hours as needed for moderate pain. 09/16/21   Geradine Girt, DO  predniSONE (DELTASONE) 10 MG tablet Take 4 tablets by mouth daily for 3 days-- 3 tablets daily for 3 days-- 2 tablets daily for 3 days-- 1 tablet daily 09/17/21   Eliseo Squires,  Jessica U, DO  RABEprazole (ACIPHEX) 20 MG tablet Take 20 mg by mouth daily as needed (for acid reflux). 11/06/19   [provider]  senna-docusate (SENOKOT-S) 8.6-50 MG tablet Take 1 tablet by mouth 2 (two) times daily as needed for moderate constipation. 09/16/21   Geradine Girt, DO    Physical Exam: Vitals:   09/23/21 0640 09/23/21 0856 09/23/21 0920 09/23/21 1123  BP: 131/72 (!) 157/83 (!) 158/82 136/68  Pulse: 70 70 70 71  Resp: '15 16 18 18  '$ Temp:  98.2 F (36.8 C)    TempSrc:  Oral    SpO2: 98% 95% 95% 94%   General:  Appears calm and comfortable and is in NAD Eyes:   EOMI, normal lids; R conjunctival hemorrhage (reports that this developed after vomiting during last hospitalization, no vision change) ENT:  grossly normal hearing, lips & tongue, mmm Neck:  no LAD, masses or thyromegaly Cardiovascular:  RRR, no m/r/g. No LE edema.  Respiratory:   CTA bilaterally with no wheezes/rales/rhonchi.  Normal respiratory effort. Abdomen:  soft, NT, ND Skin:  no rash or induration seen on limited exam Musculoskeletal:  grossly normal tone BUE/BLE, RLE radicular pain Psychiatric:  blunted mood and affect, speech fluent and appropriate, AOx3 Neurologic:  CN 2-12 grossly intact, moves all extremities in coordinated fashion other than RLE due to pain   Radiological Exams on Admission: Independently reviewed - see discussion in A/P where applicable  No results found.  EKG: Independently reviewed.  Sinus tachycardia with rate 102; LAFB; no evidence of acute ischemia   Labs on Admission: I have personally reviewed the available labs and imaging studies at the time of the admission.  Pertinent labs:    Glucose 191 BUN 20/Creatinine 1.85/GFR 38; stage 3a CKD Albumin 3.2 Lactate 1.5 WBC 17.1 Hgb  12.5 Platelets 414 A1c 11.9 on 7/11   Assessment and Plan: Principal Problem:   Back pain Active Problems:   Sarcoidosis   OSA (obstructive sleep apnea)   Coronary atherosclerosis of native coronary artery   Uncontrolled type 2 diabetes mellitus with hyperglycemia, with long-term current use of insulin (HCC)   Hyperlipidemia   Hypothyroidism   Herniated lumbar intervertebral disc    Lumbar radiculopathy -Patient with recent admission for the same presenting with severe persistent LBP with radiculopathy despite steroids -MRI with anterolisthesis, disc bulge and right foraminal disc extrusion at L4-5 resulting in mild to moderate spinal canal stenosis and severe bilateral neural foraminal narrowing, greater on the right side, with impingement of the bilateral L4 nerve roots -Patient is followed by Dr. Zada Finders and upcoming need for surgery was previously anticipated -He has progressed and needs admission for pain control and possible surgical repair during this hospitalization -Will admit to med surg -Dr. Zada Finders is aware and will consult -Pain control with Tylenol, Robaxin, oxy, morphine -Of note, he says he has not taken Plavix since last hospitalization in anticipation of surgery -Continue Neurontin  DM -Last A1c was 11.9, indicating very poor control -Continue 70/30 -Cover with moderate-scale SSI   HTN -Continue hydralazine  CAD -DES x 3 placed in 01/2021 -Continue ASA -Hold Plavix (last took prior to last hospitalization) -From last hospitalization, it appears that he will need cardiac clearance prior to surgery - will request -Of note, he has completed at least 6 months of DAPT  Sarcoidosis -Continue albuterol, Singulair -It appears that he is taking daily steroids for sarcoidosis suppression, will continue 10 mg prednisone daily for now unless neurosurgery prefers other  HLD -Continue Zetia -Not actively taking Crestor since statins "make him mean" but needs  so will resume  Hypothyroidism -Continue Synthroid    OSA on CPAP -Continue CPAP      Advance Care Planning:   Code Status: Full Code   Consults: Neurosurgery; cardiology; TOC team  DVT Prophylaxis: SCDs  Family Communication: Wife was present at the bedside throughout evaluation  Severity of Illness: The appropriate patient status for this patient is INPATIENT. Inpatient status is judged to be reasonable and necessary in order to provide the required intensity of service to ensure the patient's safety. The patient's presenting symptoms, physical exam findings, and initial radiographic and laboratory data in the context of their chronic comorbidities is felt to place them at high risk for further clinical deterioration. Furthermore, it is not anticipated that the patient will be medically stable for discharge from the hospital within 2 midnights of admission.   * I certify that at the point of admission it is my clinical judgment that the patient will require inpatient hospital care spanning beyond 2 midnights from the point of admission due to high intensity of service, high risk for further deterioration and high frequency of surveillance required.*  Author: Karmen Bongo, MD 09/23/2021 1:43 PM  For on call review [email or web address].

## 2021-09-23 NOTE — Consult Note (Signed)
Neurosurgery Consultation  Reason for Consult: Low back / RLE pain Referring Physician: Lorin Mercy  CC: Low back / RLE pain  HPI: This is a 76 y.o. man that I saw previously in the office for severe LBP / RLE pain that presents with worsening pain. Same symptoms as when I last saw him except increased in severity, no new injury / falls, no new areas of pain, no new weakness except pain limitation that prevents him from ambulating, stable R foot numbness, no recent change in bowel or bladder function. Has been off of his clopidogrel for the past week, recently had opiates, a steroid pulse / taper, previously has done formal PT and had an ESI, symptoms still refractory and he's now non-ambulatory due to the pain, hence his presentation to the ED.    ROS: A 14 point ROS was performed and is negative except as noted in the HPI.   PMHx:  Past Medical History:  Diagnosis Date   Adrenal insufficiency ([place])    Allergy    Anemia    Arthritis    feet    Broken foot 09/2019   had to wore a boot. Left    CKD (chronic kidney disease), stage III (HCC)    Coronary artery disease    has stents   Coronary atherosclerosis of native coronary artery    Proximal LAD, posterior lateral stent widely patent-10/12/11   Diabetes mellitus    insulin and pills   Hearing loss    wears hearing aids   History of blood transfusion 06/30/2016   [facility] - 2 units transfused   Hypertension    OSA (obstructive sleep apnea)    uses VPAC sleep study 2 years done through [place]. Dr. Marlou Porch arranged study   Sarcoidosis    Sleep apnea    uses CPAP nightly   Thyroid disease    Type 2 diabetes mellitus ([place])    FamHx:  Family History  Problem Relation Age of Onset   Kidney disease Mother    Diabetes Mother    Kidney cancer Mother    Heart attack Father    Asthma Sister    Anesthesia problems Sister        "Kidney's did not wake up"   Sarcoidosis Sister    Sarcoidosis Niece    Colon cancer Neg Hx    Colon  polyps Neg Hx    Rectal cancer Neg Hx    Stomach cancer Neg Hx    SocHx:  reports that he has never smoked. He has never used smokeless tobacco. He reports that he does not drink alcohol and does not use drugs.  Exam: Vital signs in last 24 hours: Temp:  [97.5 F (36.4 C)-98.2 F (36.8 C)] 98.2 F (36.8 C) (07/20 1349) Pulse Rate:  [70-100] 70 (07/20 1349) Resp:  [15-19] 18 (07/20 1349) BP: (83-158)/(53-83) 147/73 (07/20 1349) SpO2:  [94 %-100 %] 97 % (07/20 1349) General: Awake, alert, cooperative, lying in hospital bed, appears uncomfortable Head: Normocephalic and atruamatic HEENT: Neck supple Pulmonary: breathing room air comfortably, no evidence of increased work of breathing Cardiac: RRR Abdomen: S NT ND Extremities: Warm and well perfused x4 Neuro: AOx3, PERRL, EOMI, FS Strength 5/5 x4, SILTx4 except numbness of the dorsum of the right foot that goes into the 1st webspace   Assessment and Plan: 76 y.o. man with lumbar spondylolisthesis and radiculopathy with severe back / RLE pain, now his second admission for refractory pain despite multi-modality therapy.   -discussed with  the patient, he would like to proceed with surgery as soon as possible. I'm on call tomorrow and explained to him that, barring emergent surgeries, should be able to do it tomorrow but again at the mercy of triage. No change in surgery, it would be what we discussed in clinic - an L4-5 MIS decompression and TLIF, reiterated that his multiple medical problems make him higher risk for perioperative complications but he was clear that he doesn't want to live like this. Will add on to the OR schedule for tomorrow, please keep NPO after midnight. -please call with any concerns or questions  Craig Part, MD 09/23/21 1:53 PM [facility]

## 2021-09-23 NOTE — Consult Note (Addendum)
Cardiology Consultation:   Patient ID: NIGIL BRAMAN MRN: [id number]; DOB: 1945-04-15  Admit date: 09/22/2021 Date of Consult: 09/23/2021  PCP:  Shirline Frees, MD   [facility] Providers Cardiologist:  Candee Furbish, MD        Patient Profile:   BALIN VANDEGRIFT is a 76 y.o. male with a hx of CAD, pulmonary sarcoidosis, history of diastolic CHF, recurrent anemia due to GI bleed, adrenal insufficiency on chronic steroid, hypertension, hyperlipidemia, PAD, CKD and COVID infection in August 2022 who is being seen 09/23/2021 for the evaluation of PREOPERATIVE CARDIOVASCULAR ASSESSMENT-for back surgery (tomorrow) at the request of Dr. Lorin Mercy.  History of Present Illness:   Mr. Daubert is a 76 year old male with past medical history of CAD, pulmonary sarcoidosis, history of diastolic CHF, recurrent anemia due to GI bleed, adrenal insufficiency on chronic steroid, hypertension, hyperlipidemia, PAD, CKD and COVID infection in August 2022.  He has had multiple PCI in the past.  Patient had DES to LAD and PLA in August 2011.  He received a DES x2 to [place] in June 2020.  Echocardiogram obtained in May 2020 showed EF 60 to 65%, grade 1 DD.  Patient was admitted with GI bleed in October 2021 requiring blood transfusion.  During significant anemia, he tend to have worsening chest pain.  Patient had repeat cardiac catheterization on 12/25/2020 which revealed diffuse three-vessel disease was 75 to 80% proximal RCA lesion, 60% mid to distal RCA lesion, 70% lesion in ramus, EF 45 to 50%.  He was managed medically.  However due to recurrent chest pain and worsening shortness of breath.  Patient was brought back to the Cath Lab on 01/18/2021 and underwent Onyx frontier DES x2 to proximal and mid RCA and DES to ostial D1.  Postprocedure patient was placed on aspirin and Plavix with recommendation to continue for at least 6 months, however favor long-term if well-tolerated consider multivessel stenting.  Patient was last seen by Dr.  Marlou Porch on 04/27/2021 at which time his shortness of breath has improved however chest discomfort has worsened.  His chest pain was felt to be more musculoskeletal in nature.  Repeat echocardiogram ordered on 05/13/2021 that showed EF 55 to 60%, no regional wall motion abnormality.  He has since established with Dr. Zeb Comfort of [facility] cardiology in [facility] (Per discussion with me, he indicated that he was "seeking out a second opinion", but now plans to return to [facility]- Dr. Marlou Porch).  He had a Holter monitor that showed 9.6% PVC burden, 3-4 beats run of nonsustained VT, minimal heart rate was 40 bpm at 9:43 AM.  Due to recurrent chest pain, patient ultimately underwent repeat cardiac catheterization at [place] on 08/10/2021 that revealed a 50% proximal to mid left circumflex lesion, 30% proximal LAD lesion, 40% D1 lesion, 20% proximal to mid RCA lesion.  No culprit lesion was identified medical therapy was recommended.  LVEDP was 20 mmHg, EF estimated to be 50%.  According to patient, he was eventually found to be anemic and received 3 rounds of iron infusion.  He has not had any further chest pain or worsening dyspnea since.    Patient has was admitted in early July 2023 after stumbled and twisted awkwardly.  He developed significant low back pain radiating down his right leg.  He has been treated with a course of steroids taper by Dr. Venetia Constable of Neurosurgery.  During the hospitalization, he had a hypotensive episode in the afternoon of 7/10, this was felt to be due  to adrenal insufficiency, dehydration and opioid.  Patient returned back to the emergency room on 09/22/2021 was severe back pain.  On initial arrival, he was hypertensive with blood pressure 60/40.  This improved with IV fluid.  White blood cell count has been elevated since he has been on steroid therapy.  Creatinine is high at 1.85.  EKG showed normal sinus rhythm, poor R wave progression in the anterior leads.   Past Medical History:   Diagnosis Date   Adrenal insufficiency ([place])    Allergy    Anemia    Arthritis    feet    Broken foot 09/2019   had to wore a boot. Left    CKD (chronic kidney disease), stage III (HCC)    Coronary artery disease    has stents   Coronary atherosclerosis of native coronary artery    Proximal LAD, posterior lateral stent widely patent-10/12/11   Diabetes mellitus    insulin and pills   Hearing loss    wears hearing aids   History of blood transfusion 06/30/2016   [facility] - 2 units transfused   Hypertension    OSA (obstructive sleep apnea)    uses VPAC sleep study 2 years done through [place] [facility]. Dr. Marlou Porch arranged study   Sarcoidosis    Sleep apnea    uses CPAP nightly   Thyroid disease    Type 2 diabetes mellitus [facility])     Past Surgical History:  Procedure Laterality Date   APPENDECTOMY     CATARACT EXTRACTION  2011   bilat   CHOLECYSTECTOMY  01/25/2011   Procedure: LAPAROSCOPIC CHOLECYSTECTOMY WITH INTRAOPERATIVE CHOLANGIOGRAM;  Surgeon: Judieth Keens, DO;  Location: [place];  Service: General;  Laterality: N/A;   COLONOSCOPY     several   COLONOSCOPY WITH PROPOFOL N/A 01/03/2020   Procedure: COLONOSCOPY WITH PROPOFOL;  Surgeon: Gatha Mayer, MD;  Location: [facility];  Service: Endoscopy;  Laterality: N/A;   CORONARY ANGIOPLASTY     most recent 11/2009   CORONARY STENT INTERVENTION N/A 08/07/2018   Procedure: CORONARY STENT INTERVENTION;  Surgeon: Jettie Booze, MD;  Location: [place] CV LAB;  Service: Cardiovascular;  Laterality: N/A;   CORONARY STENT INTERVENTION N/A 01/18/2021   Procedure: CORONARY STENT INTERVENTION;  Surgeon: Sherren Mocha, MD;  Location: [place] CV LAB;  Service: Cardiovascular;  Laterality: N/A;   CORONARY STENT PLACEMENT  2009   in LAD and side branch PTCA   ESOPHAGOGASTRODUODENOSCOPY (EGD) WITH PROPOFOL N/A 01/03/2020   Procedure: ESOPHAGOGASTRODUODENOSCOPY (EGD) WITH PROPOFOL;  Surgeon: Gatha Mayer, MD;  Location:  [facility];  Service: Endoscopy;  Laterality: N/A;   HEMOSTASIS CLIP PLACEMENT  01/03/2020   Procedure: HEMOSTASIS CLIP PLACEMENT;  Surgeon: Gatha Mayer, MD;  [facility]: [facility];  Service: Endoscopy;;   INTERCOSTAL NERVE BLOCK  2011, 06/2016   x2. lumbar spine   INTRAVASCULAR PRESSURE WIRE/FFR STUDY N/A 01/18/2021   Procedure: INTRAVASCULAR PRESSURE WIRE/FFR STUDY;  Surgeon: Sherren Mocha, MD;  Location: [place] CV LAB;  Service: Cardiovascular;  Laterality: N/A;   LEFT HEART CATHETERIZATION WITH CORONARY ANGIOGRAM Bilateral 10/12/2011   Procedure: LEFT HEART CATHETERIZATION WITH CORONARY ANGIOGRAM;  Surgeon: Candee Furbish, MD;  Location: [facility] CATH LAB;  Service: Cardiovascular;  Laterality: Bilateral;   LEFT HEART CATHETERIZATION WITH CORONARY ANGIOGRAM N/A 04/01/2014   Procedure: LEFT HEART CATHETERIZATION WITH CORONARY ANGIOGRAM;  Surgeon: Candee Furbish, MD;  Location: [facility] CATH LAB;  Service: Cardiovascular;  Laterality: N/A;   POLYPECTOMY  01/03/2020  Procedure: POLYPECTOMY;  Surgeon: Gatha Mayer, MD;  [facility];  Service: Endoscopy;;   RIGHT/LEFT HEART CATH AND CORONARY ANGIOGRAPHY N/A 08/07/2018   Procedure: RIGHT/LEFT HEART CATH AND CORONARY ANGIOGRAPHY;  Surgeon: Jettie Booze, MD;  Location: [place] CV LAB;  Service: Cardiovascular;  Laterality: N/A;   RIGHT/LEFT HEART CATH AND CORONARY ANGIOGRAPHY N/A 12/25/2020   Procedure: RIGHT/LEFT HEART CATH AND CORONARY ANGIOGRAPHY;  Surgeon: Belva Crome, MD;  Location: [place] CV LAB;  Service: Cardiovascular;  Laterality: N/A;     Home Medications:  Prior to Admission medications   Medication Sig Start Date End Date Taking? Authorizing Provider  acetaminophen (TYLENOL) 500 MG tablet Take 500-1,000 mg by mouth as needed (pain).   Yes [provider]  albuterol (PROVENTIL) (2.5 MG/3ML) 0.083% nebulizer solution Take 3 mLs (2.5 mg total) by nebulization every 6 (six) hours as needed for wheezing or  shortness of breath. Dx Code D86.0 07/24/14  Yes Elsie Stain, MD  Albuterol Sulfate (PROAIR RESPICLICK) 124 (90 BASE) MCG/ACT AEPB Inhale 2 puffs into the lungs every 6 (six) hours as needed. Patient taking differently: Inhale 2 puffs into the lungs as needed (for shortness of breath). 07/24/14  Yes Elsie Stain, MD  aspirin EC 81 MG tablet Take 81 mg by mouth every evening.    Yes [provider]  Cholecalciferol (VITAMIN D3) 3000 UNITS TABS Take 3,000 Units by mouth daily.   Yes [provider]  Coenzyme Q10 (COQ-10) 400 MG CAPS Take 400 mg by mouth daily.   Yes [provider]  ezetimibe (ZETIA) 10 MG tablet Take 10 mg by mouth once a week. 06/25/21  Yes [provider]  furosemide (LASIX) 80 MG tablet Take 1 tablet (80 mg total) by mouth daily as needed for edema. Patient taking differently: Take 80 mg by mouth daily. 09/16/21  Yes Vann, Jessica U, DO  gabapentin (NEURONTIN) 100 MG capsule Take 1 capsule (100 mg total) by mouth 3 (three) times daily. 09/16/21  Yes Eulogio Bear U, DO  hydrALAZINE (APRESOLINE) 10 MG tablet Take 1 tablet (10 mg total) by mouth every 8 (eight) hours. Patient taking differently: Take 10 mg by mouth 3 (three) times daily. 09/16/21  Yes Vann, Jessica U, DO  insulin aspart protamine- aspart (NOVOLOG MIX 70/30) (70-30) 100 UNIT/ML injection Inject 0.4 mLs (40 Units total) into the skin with breakfast, with lunch, and with evening meal. Patient taking differently: Inject 35 Units into the skin with breakfast, with lunch, and with evening meal. 09/16/21  Yes Vann, Jessica U, DO  levothyroxine (SYNTHROID) 75 MCG tablet Take 75 mcg by mouth daily. 02/24/21  Yes [provider]  lidocaine (LIDODERM) 5 % Place 1 patch onto the skin daily as needed for pain. 10/21/20  Yes [provider]  montelukast (SINGULAIR) 10 MG tablet Take 10 mg by mouth daily as needed (allergies).  07/18/18  Yes [provider]  Multiple  Vitamin (MULTIVITAMIN WITH MINERALS) TABS tablet Take 1 tablet by mouth daily. 10/08/18  Yes Hongalgi, Lenis Dickinson, MD  nitroGLYCERIN (NITROSTAT) 0.4 MG SL tablet Place 1 tablet (0.4 mg total) under the tongue every 5 (five) minutes as needed for chest pain. 07/01/20 09/23/21 Yes Imogene Burn, PA-C  OVER THE COUNTER MEDICATION Take 1 tablet by mouth daily. [place] XL   Yes [provider]  oxyCODONE (OXY IR/ROXICODONE) 5 MG immediate release tablet Take 1 tablet (5 mg total) by mouth every 6 (six) hours as needed  for moderate pain. Patient taking differently: Take 5 mg by mouth daily as needed for moderate pain. 09/16/21  Yes Vann, Jessica U, DO  predniSONE (DELTASONE) 10 MG tablet Take 4 tablets by mouth daily for 3 days-- 3 tablets daily for 3 days-- 2 tablets daily for 3 days-- 1 tablet daily Patient taking differently: Take 10 mg by mouth daily in the afternoon. 09/17/21  Yes Geradine Girt, DO  clopidogrel (PLAVIX) 75 MG tablet Take 1 tablet (75 mg total) by mouth daily. Patient not taking: Reported on 09/23/2021 04/17/14   Jerline Pain, MD  DROPLET INSULIN SYRINGE 31G X 5/16" 1 ML MISC  08/14/18   [provider]  rosuvastatin (CRESTOR) 20 MG tablet Take 20 mg by mouth daily. Patient not taking: Reported on 09/23/2021    [provider]    Inpatient Medications: Scheduled Meds:  aspirin EC  81 mg Oral QPM   docusate sodium  100 mg Oral BID   gabapentin  100 mg Oral TID   hydrALAZINE  10 mg Oral Q8H   insulin aspart  0-15 Units Subcutaneous TID WC   insulin aspart  0-5 Units Subcutaneous QHS   insulin aspart protamine- aspart  40 Units Subcutaneous TID with meals   levothyroxine  75 mcg Oral Q0600   predniSONE  40 mg Oral Q breakfast   Followed by   Derrill Memo ON 09/26/2021] predniSONE  30 mg Oral Q breakfast   Followed by   Derrill Memo ON 09/29/2021] predniSONE  20 mg Oral Q breakfast   Followed by   Derrill Memo ON 10/02/2021] predniSONE  10 mg Oral Q breakfast   rosuvastatin  20  mg Oral Daily   Continuous Infusions:  lactated ringers 75 mL/hr at 09/23/21 1129   methocarbamol (ROBAXIN) IV     PRN Meds: acetaminophen **OR** acetaminophen, albuterol, bisacodyl, hydrALAZINE, methocarbamol (ROBAXIN) IV, morphine injection, ondansetron **OR** ondansetron (ZOFRAN) IV, oxyCODONE  Allergies:    Allergies  Allergen Reactions   Statins Other (See Comments)    Pt says they make him "mean"   Hydrocortisone Nausea Only   Ranexa [Ranolazine] Other (See Comments)    Severe weakness/near syncope after 1 dose hallucinations   Azithromycin     Raises blood sugar   Hydrocodone Nausea And Vomiting   Miralax [Polyethylene Glycol] Nausea And Vomiting   Metolazone Other (See Comments)    Drained, no energy    Social History:   Social History   Socioeconomic History   Marital status: Married    Spouse name: Not on file   Number of children: 3   Years of education: Not on file   Highest education level: Not on file  Occupational History   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   Occupation: MANAGER    Employer: [employer]  Tobacco Use   Smoking status: Never   Smokeless tobacco: Never  Vaping Use   Vaping Use: Never used  Substance and Sexual Activity   Alcohol use: No   Drug use: No   Sexual activity: Not on file  Other Topics Concern   Not on file  Social History Narrative   Not on file   Social Determinants of Health   Financial Resource Strain: Not on file  Food Insecurity: Not on file  Transportation Needs: Not on file  Physical Activity: Not on file  Stress: Not on file  Social Connections: Not on file  Intimate Partner Violence: Not on file    Family History:  Family History  Problem Relation Age of Onset   Kidney disease Mother    Diabetes Mother    Kidney cancer Mother    Heart attack Father    Asthma Sister    Anesthesia problems Sister        "Kidney's did not wake up"   Sarcoidosis Sister    Sarcoidosis Niece     Colon cancer Neg Hx    Colon polyps Neg Hx    Rectal cancer Neg Hx    Stomach cancer Neg Hx      ROS:  Please see the history of present illness.   All other ROS reviewed and negative.     Physical Exam/Data:   Vitals:   09/23/21 0920 09/23/21 1123 09/23/21 1347 09/23/21 1349  BP: (!) 158/82 136/68 (!) 147/73 (!) 147/73  Pulse: 70 71  70  Resp: '18 18  18  '$ Temp:    98.2 F (36.8 C)  TempSrc:    Oral  SpO2: 95% 94%  97%    Intake/Output Summary (Last 24 hours) at 09/23/2021 1540 Last data filed at 09/23/2021 0320 Gross per 24 hour  Intake 1000 ml  Output --  Net 1000 ml      09/13/2021    5:22 AM 04/27/2021    1:36 PM 01/26/2021    8:40 AM  Last 3 Weights  Weight (lbs) 203 lb 9.6 oz 217 lb 227 lb 6.4 oz  Weight (kg) 92.352 kg 98.431 kg 103.148 kg     There is no height or weight on file to calculate BMI.  General:  Well nourished, well developed, in no acute distress HEENT: normal Neck: no JVD Vascular: No carotid bruits; Distal pulses 2+ bilaterally Cardiac:  normal S1, S2; RRR; no murmur  Lungs:  clear to auscultation bilaterally, no wheezing, rhonchi or rales  Abd: soft, nontender, no hepatomegaly  Ext: no edema Musculoskeletal:  No deformities, BUE and BLE strength normal and equal Skin: warm and dry  Neuro:  CNs 2-12 intact, no focal abnormalities noted Psych:  Normal affect   EKG:  The EKG was personally reviewed and demonstrates: Normal sinus rhythm, poor R wave progression in anterior leads. Telemetry:  Telemetry was personally reviewed and demonstrates: Normal sinus rhythm, no significant ventricular ectopy  Relevant CV Studies:  TTE 05/13/2021: Normal LV size and function.  EF 55 to 60%.  No RWMA.  Indeterminate D Fxn -> however evidence of elevated LVEDP.  No MR or MS.  AOV sclerosis with no stenosis.  Normal RV size and function.  Normal RAP.  Staged IVUS-Guided PCI 01/18/2021:  pLAD 40% U& 65%, ost D1 80%, Ost RI 70%, mlCx 50%. Prox RCA 75%, mRCA  60%, patent stents in rPDA Successful two-vessel PCI guided by RFR/FFR with stenting of severe stenoses in the proximal and mid RCA and severe stenosis in D1 branch of the LAD.   All lesions treated with Onyx Frontier Drug-Eluting stents (total of 3 stents implanted).  Ost-D1: Onyx Frontier 2.5 x 18 => 2.6 mm FFR of p&mRCA + => 2 Site PCI pRCA Onyx Frontier 3.5 x 15 => 4.0 mm mRCA Onyx Frontier 3.5 x 18 => 4.0 mm Intervention--images reviewed.  Cath 08/10/2021 [facility]) Prox Cx to Mid Cx lesion is 50% stenosed.    Prox LAD lesion is 50% stenosed.    Prox LAD to Mid LAD lesion is 15% stenosed.    1st Diag-1 lesion is 40% stenosed.    1st Diag-2 lesion is 20% stenosed.  RPAV lesion is 15% stenosed.    Prox RCA lesion is 20% stenosed.    Mid RCA lesion is 20% stenosed.   Laboratory Data:  High Sensitivity Troponin:  No results for input(s): "TROPONINIHS" in the last 720 hours.   Chemistry Recent Labs  Lab 09/22/21 1704  NA 138  K 4.0  CL 99  CO2 26  GLUCOSE 191*  BUN 20  CREATININE 1.85*  CALCIUM 9.0  GFRNONAA 38*  ANIONGAP 13    Recent Labs  Lab 09/22/21 1704  PROT 6.1*  ALBUMIN 3.2*  AST 24  ALT 28  ALKPHOS 52  BILITOT 1.0   Lipids No results for input(s): "CHOL", "TRIG", "HDL", "LABVLDL", "LDLCALC", "CHOLHDL" in the last 168 hours.  Hematology Recent Labs  Lab 09/22/21 1704  WBC 17.1*  RBC 4.98  HGB 12.5*  HCT 43.6  MCV 87.6  MCH 25.1*  MCHC 28.7*  RDW 20.9*  PLT 414*   Thyroid No results for input(s): "TSH", "FREET4" in the last 168 hours.  BNPNo results for input(s): "BNP", "PROBNP" in the last 168 hours.  DDimer No results for input(s): "DDIMER" in the last 168 hours.   Radiology/Studies:  No results found.   Assessment and Plan:   Preoperative clearance:  -Pending upcoming back surgery tomorrow.  Patient has been holding Plavix for over a week.  He has completed 6 months of dual antiplatelet therapy since last PCI on  01/18/2021  -Recent cardiac catheterization performed at [facility] showed nonobstructive disease.  -He has not had any further chest discomfort or worsening dyspnea, patient is at acceptable risk to proceed with upcoming surgery without further work-up.  Severe back pain: Plan for L4-5 transforaminal lumbar interbody fusion tomorrow with Dr. Venetia Constable  CAD: Multiple PCI in the past.  Plavix has been holding for the past week anticipation of upcoming back surgery.  On aspirin.  Last PCI in November 2020  Pulmonary sarcoidosis: Stable  Chronic diastolic heart failure: Euvolemic on exam  Recurrent anemia due to GI bleeding  Adrenal insufficiency on chronic steroid  Hypertension  Hyperlipidemia: on Crestor  Acute on chronic renal insufficiency: Creatinine elevated at 1.8.  Hold Lasix.  Patient has been taking Lasix daily although it was initially prescribed as needed.  He was dehydrated on arrival.  Blood pressure is improved after hydration.    For questions or updates, please contact [place] Please consult [email or web address] for contact info under    Signed, Almyra Deforest, [place]  09/23/2021 3:40 PM   ATTENDING ATTESTATION  I have seen, examined and evaluated the patient this Evening in the ER along with Almyra Deforest, PA.  After reviewing all the available data and chart, we discussed the patients laboratory, study & physical findings as well as symptoms in detail. I agree with his findings, examination as well as impression recommendations as per our discussion.    Attending adjustments noted in italics.   [name] has had a recent significant cardiovascular evaluation just about a month ago at [facility] cardiology.  He underwent a cardiac catheterization showing patent stents with no significant disease.  Echo done relatively recently last year was essentially normal.  His major risk factors diabetes and the presence of existing CAD, however from a cardiovascular risk assessment standpoint,  being revascularized within the last 5 years with no recurrent symptoms and does not portend an adverse outcome.  He has had a regular catheterization to reassess other atypical symptoms that were probably more consistent with symptomatic anemia than CAD.  He does certainly does not require any additional ischemic evaluation.  Ostensibly, he would benefit from a beta-blocker perioperatively for risk reduction, but not when started this close to surgery.   Recommend proceeding to the OR.  The patient was was actually low to intermediate risk for a relatively low to intermediate risk surgery.  No additional cardiovascular evaluation necessary.  His DOAC's been held.  [facility] heart care will be available for further assistance, but will otherwise sign off pending surgery.  No further cardiac evaluation needed for surgery.  If necessary for assistance in postop hospital management, please do not hesitate to contact us to reengage.    Glenetta Hew, M.D., M.S. Interventional Cardiologist   Pager # [phone] Phone # [phone] [address] [place], [place] [address]

## 2021-09-23 NOTE — ED Provider Notes (Signed)
Emergency Ultrasound Study:   Angiocath insertion Performed by: Merrily Pew  Consent: Verbal consent obtained. Risks and benefits: risks, benefits and alternatives were discussed Immediately prior to procedure the correct patient, procedure, equipment, support staff and site/side marked as needed.  Indication: difficult IV access Preparation: Patient was prepped and draped in the usual sterile fashion. Vein Location: Left basilic vein was visualized during assessment for potential access sites and was found to be patent/ easily compressed with linear ultrasound.  The needle was visualized with real-time ultrasound and guided into the vein. Gauge: 20  Normal blood return.  Patient tolerance: Patient tolerated the procedure well with no immediate complications.     Candido Flott, Corene Cornea, MD 09/23/21 [phone]

## 2021-09-23 NOTE — ED Provider Notes (Signed)
[facility] EMERGENCY DEPARTMENT Provider Note   CSN: [id number] Arrival date & time: 09/22/21  1523     History  Chief Complaint  Patient presents with   Hypotension    [name] is a 76 y.o. male.  HPI  Medical history including CAD with stents, OSA, insulin-dependent diabetes, sarcoidosis, on prednisone chronic lower back pain with sciatica presents with complaints of dehydration and hypotension.  Patient states that she is again discharged from possible 10 days ago he has not been eating or drinking very much, he states that the pain keeps him from eating or drinking.  He states that he is drinking fluids but is not eating very much.  He states that he has significant lower back pain, pain radiates into his right leg, has remained constant, has not gotten worse, denies any trauma to the area, denies saddle paresthesias urinary or bowel incontinence he is.  Last bowel movement was yesterday he does notice that he is urinating less frequently thinks is from being dehydrated.  Denies any URI-like symptoms.   Reviewed patient's chart was admitted on 7/09 and discharged on 07/13 admitted for worsening lower back pain, evaluated by neurosurgery Dr. Venetia Constable placed on a steroid taper and will bring him to surgery on Friday.  During his previous visit he was noted to be hypotensive, likely multifactorial adrenal insufficiency from chronic steroid use as well as dehydration opioid use.   Home Medications Prior to Admission medications   Medication Sig Start Date End Date Taking? Authorizing Provider  acetaminophen (TYLENOL) 325 MG tablet Take 2 tablets (650 mg total) by mouth every 6 (six) hours. 09/16/21   Geradine Girt, DO  albuterol (PROVENTIL) (2.5 MG/3ML) 0.083% nebulizer solution Take 3 mLs (2.5 mg total) by nebulization every 6 (six) hours as needed for wheezing or shortness of breath. Dx Code D86.0 07/24/14   Elsie Stain, MD  Albuterol Sulfate (PROAIR  RESPICLICK) 856 (90 BASE) MCG/ACT AEPB Inhale 2 puffs into the lungs every 6 (six) hours as needed. Patient taking differently: Inhale 2 puffs into the lungs every 6 (six) hours as needed (for shortness of breath). 07/24/14   Elsie Stain, MD  aspirin EC 81 MG tablet Take 81 mg by mouth every evening.     [provider]  Cholecalciferol (VITAMIN D3) 3000 UNITS TABS Take 3,000 Units by mouth once a week. Sunday    [provider]  clopidogrel (PLAVIX) 75 MG tablet Take 1 tablet (75 mg total) by mouth daily. 04/17/14   Jerline Pain, MD  Coenzyme Q10 (COQ-10) 400 MG CAPS Take 400 mg by mouth 3 (three) times a week.    [provider]  DROPLET INSULIN SYRINGE 31G X 5/16" 1 ML [place]  08/14/18   [provider]  ezetimibe (ZETIA) 10 MG tablet Take 10 mg by mouth once a week. Sunday 06/25/21   [provider]  furosemide (LASIX) 80 MG tablet Take 1 tablet (80 mg total) by mouth daily as needed for edema. 09/16/21   Geradine Girt, DO  gabapentin (NEURONTIN) 100 MG capsule Take 1 capsule (100 mg total) by mouth 3 (three) times daily. 09/16/21   Geradine Girt, DO  hydrALAZINE (APRESOLINE) 10 MG tablet Take 1 tablet (10 mg total) by mouth every 8 (eight) hours. 09/16/21   Geradine Girt, DO  insulin aspart protamine- aspart (NOVOLOG MIX 70/30) (70-30) 100 UNIT/ML injection Inject 0.4 mLs (40 Units total) into the skin with breakfast,  with lunch, and with evening meal. 09/16/21   Geradine Girt, DO  levothyroxine (SYNTHROID) 75 MCG tablet Take 75 mcg by mouth daily. 02/24/21   [provider]  lidocaine (LIDODERM) 5 % Place 1 patch onto the skin daily as needed for pain. 10/21/20   [provider]  montelukast (SINGULAIR) 10 MG tablet Take 10 mg by mouth daily as needed (allergies).  07/18/18   [provider]  Multiple Vitamin (MULTIVITAMIN WITH MINERALS) TABS tablet Take 1 tablet by mouth daily. 10/08/18   Hongalgi, Lenis Dickinson, MD   nitroGLYCERIN (NITROSTAT) 0.4 MG SL tablet Place 1 tablet (0.4 mg total) under the tongue every 5 (five) minutes as needed for chest pain. 07/01/20 04/27/21  Imogene Burn, PA-C  OVER THE COUNTER MEDICATION Take 1 tablet by mouth daily. [place] XL    [provider]  oxyCODONE (OXY IR/ROXICODONE) 5 MG immediate release tablet Take 1 tablet (5 mg total) by mouth every 6 (six) hours as needed for moderate pain. 09/16/21   Geradine Girt, DO  predniSONE (DELTASONE) 10 MG tablet Take 4 tablets by mouth daily for 3 days-- 3 tablets daily for 3 days-- 2 tablets daily for 3 days-- 1 tablet daily 09/17/21   Geradine Girt, DO  RABEprazole (ACIPHEX) 20 MG tablet Take 20 mg by mouth daily as needed (for acid reflux). 11/06/19   [provider]  senna-docusate (SENOKOT-S) 8.6-50 MG tablet Take 1 tablet by mouth 2 (two) times daily as needed for moderate constipation. 09/16/21   Geradine Girt, DO      Allergies    Statins, Hydrocortisone, Ranexa [ranolazine], Azithromycin, Miralax [polyethylene glycol], and Metolazone    Review of Systems   Review of Systems  Constitutional:  Positive for appetite change. Negative for chills and fever.  Respiratory:  Negative for shortness of breath.   Cardiovascular:  Negative for chest pain.  Gastrointestinal:  Negative for abdominal pain.  Musculoskeletal:  Positive for back pain.  Neurological:  Negative for headaches.    Physical Exam Updated Vital Signs BP 131/72 (BP Location: Right Arm)   Pulse 70   Temp 97.9 F (36.6 C) (Oral)   Resp 15   SpO2 98%  Physical Exam Vitals and nursing note reviewed.  Constitutional:      General: He is not in acute distress.    Appearance: He is not ill-appearing.  HENT:     Head: Normocephalic and atraumatic.     Nose: No congestion.  Eyes:     Conjunctiva/sclera: Conjunctivae normal.  Cardiovascular:     Rate and Rhythm: Normal rate and regular rhythm.     Pulses: Normal pulses.     Heart sounds:  No murmur heard.    No friction rub. No gallop.  Pulmonary:     Effort: No respiratory distress.     Breath sounds: No wheezing, rhonchi or rales.  Abdominal:     Palpations: Abdomen is soft.     Tenderness: There is no abdominal tenderness. There is no right CVA tenderness or left CVA tenderness.  Musculoskeletal:     Comments: Spine was palpated tenderness to palpation lower lumbar region without step-off deformities noted, he has full range of motion, 4/5 strength in lower extremities bilaterally secondary due to pain, neurovascular fully intact.    Skin:    General: Skin is warm and dry.  Neurological:     Mental Status: He is alert.     Comments: No facial asymmetry no difficulty with  word finding following two-step commands no unilateral weakness present.  Psychiatric:        Mood and Affect: Mood normal.     ED Results / Procedures / Treatments   Labs (all labs ordered are listed, but only abnormal results are displayed) Labs Reviewed  COMPREHENSIVE METABOLIC PANEL - Abnormal; Notable for the following components:      Result Value   Glucose, Bld 191 (*)    Creatinine, Ser 1.85 (*)    Total Protein 6.1 (*)    Albumin 3.2 (*)    GFR, Estimated 38 (*)    All other components within normal limits  CBC WITH DIFFERENTIAL/PLATELET - Abnormal; Notable for the following components:   WBC 17.1 (*)    Hemoglobin 12.5 (*)    MCH 25.1 (*)    MCHC 28.7 (*)    RDW 20.9 (*)    Platelets 414 (*)    Neutro Abs 13.0 (*)    nRBC 1 (*)    Abs Immature Granulocytes 0.30 (*)    All other components within normal limits  LACTIC ACID, PLASMA  LACTIC ACID, PLASMA  URINALYSIS, ROUTINE W REFLEX MICROSCOPIC    EKG None  Radiology No results found.  Procedures Procedures    Medications Ordered in ED Medications  lactated ringers bolus 1,000 mL (0 mLs Intravenous Stopped 09/23/21 0320)  fentaNYL (SUBLIMAZE) injection 50 mcg (50 mcg Intravenous Given 09/23/21 0423)    ED  Course/ Medical Decision Making/ A&P                           Medical Decision Making Amount and/or Complexity of Data Reviewed Labs: ordered.  Risk Prescription drug management. Decision regarding hospitalization.   This patient presents to the ED for concern of hypotension, this involves an extensive number of treatment options, and is a complaint that carries with it a high risk of complications and morbidity.  The differential diagnosis includes sepsis, spinal cord abnormality, spinal equina, bowel obstruction    Additional history obtained:  Additional history obtained from wife at bedside External records from outside source obtained and reviewed including previous hospitalization note   Co morbidities that complicate the patient evaluation  Diabetes, chronic back pain  Social Determinants of Health:  Geriatric    Lab Tests:  I Ordered, and personally interpreted labs.  The pertinent results include: CBC shows leukocytosis of 17.1, hemoglobin of 12.5, CMP shows glucose of 191 creatinine 1.85, GFR 38   Imaging Studies ordered:  I ordered imaging studies including N/A I independently visualized and interpreted imaging which showed N/A I agree with the radiologist interpretation   Cardiac Monitoring:  The patient was maintained on a cardiac monitor.  I personally viewed and interpreted the cardiac monitored which showed an underlying rhythm of: EKG without signs of ischemia   Medicines ordered and prescription drug management:  I ordered medication including fluids, pain medication I have reviewed the patients home medicines and have made adjustments as needed  Critical Interventions:  N/A   Reevaluation:  Presents with dehydration, on arrival patient was noted to be hypotensive, given a liter of fluids, and respond well to it, has noted back pain, he has no other complaints.  Triage obtain basic lab work-up which I personally reviewed slight increase  in his creatinine also is unremarkable.  will add on lactic present pain meds and reassess.   Patient is reassessed vital signs have improved, recommended admission for further observation of  hypotension as well as continue pain management.  Patient agreed this plan.    Consultations Obtained:  I requested consultation with the hospitalist Dr. Marlowe Sax,  and discussed lab and imaging findings as well as pertinent plan - they recommend:  will admit the patient.    Test Considered:  CT lumbar spine-deferred respiration for fracture is low at this time no traumatic injury, patient is chronic pain.    Rule out I have low suspicion for spinal fracture or spinal cord abnormality as patient denies urinary incontinency, retention, difficulty with bowel movements, denies saddle paresthesias.  Spine was palpated there is no step-off, crepitus or gross deformities felt, equal strength bilaterally, full range of motion, neurovascular fully intact in the lower extremities.  Low suspicion for septic arthritis as patient denies IV drug use, skin exam was performed no erythematous, edema or warm joints noted.  Low suspicion for AAA or dissection presentation is atypical, no pulsatile mass, pain is focalized reproducible more consistent with bulging disc.  Low suspicion for sepsis patient nontoxic-appearing vital signs are reassuring.  He was hypotensive on arrival does have a elevated white count but I suspect hypotension is likely from dehydration and white count likely elevated from chronic steroid use.     Dispostion and problem list  After consideration of the diagnostic results and the patients response to treatment, I feel that the patent would benefit from admission.  Hypotension-likely from dehydration from poor oral intake due to continuous back pain.  Patient received a liter of fluid, patient need further monitoring. Back pain-patient failed outpatient therapy, likely patient need to continue  pain management and possible consultation with neurosurgery for further recommendations to address bulging disc at L4-L5.            Final Clinical Impression(s) / ED Diagnoses Final diagnoses:  Chronic right-sided low back pain with left-sided sciatica    Rx / DC Orders ED Discharge Orders     None         Marcello Fennel, PA-C 09/23/21 0701    Mesner, Corene Cornea, MD 09/23/21 [phone]

## 2021-09-24 ENCOUNTER — Inpatient Hospital Stay (HOSPITAL_COMMUNITY): Payer: Medicare HMO | Admitting: Anesthesiology

## 2021-09-24 ENCOUNTER — Other Ambulatory Visit: Payer: Self-pay

## 2021-09-24 ENCOUNTER — Inpatient Hospital Stay (HOSPITAL_COMMUNITY): Payer: Medicare HMO

## 2021-09-24 ENCOUNTER — Encounter (HOSPITAL_COMMUNITY): Payer: Self-pay | Admitting: Internal Medicine

## 2021-09-24 ENCOUNTER — Encounter (HOSPITAL_COMMUNITY)
Admission: EM | Disposition: A | Discharge status: Home / Self Care | Payer: Self-pay | Source: Home / Self Care | Attending: Internal Medicine

## 2021-09-24 DIAGNOSIS — M545 Low back pain, unspecified: Secondary | ICD-10-CM | POA: Diagnosis not present

## 2021-09-24 DIAGNOSIS — M5416 Radiculopathy, lumbar region: Secondary | ICD-10-CM

## 2021-09-24 DIAGNOSIS — M4316 Spondylolisthesis, lumbar region: Secondary | ICD-10-CM

## 2021-09-24 DIAGNOSIS — Z7984 Long term (current) use of oral hypoglycemic drugs: Secondary | ICD-10-CM

## 2021-09-24 DIAGNOSIS — E1151 Type 2 diabetes mellitus with diabetic peripheral angiopathy without gangrene: Secondary | ICD-10-CM

## 2021-09-24 HISTORY — PX: TRANSFORAMINAL LUMBAR INTERBODY FUSION W/ MIS 1 LEVEL: SHX6145

## 2021-09-24 LAB — SURGICAL PCR SCREEN
MRSA, PCR: NEGATIVE
Staphylococcus aureus: NEGATIVE

## 2021-09-24 LAB — BASIC METABOLIC PANEL
Anion gap: 12 (ref 5–15)
BUN: 26 mg/dL — ABNORMAL HIGH (ref 8–23)
CO2: 21 mmol/L — ABNORMAL LOW (ref 22–32)
Calcium: 8.4 mg/dL — ABNORMAL LOW (ref 8.9–10.3)
Chloride: 105 mmol/L (ref 98–111)
Creatinine, Ser: 1.28 mg/dL — ABNORMAL HIGH (ref 0.61–1.24)
GFR, Estimated: 58 mL/min — ABNORMAL LOW (ref >60)
Glucose, Bld: 179 mg/dL — ABNORMAL HIGH (ref 70–99)
Potassium: 5.6 mmol/L — ABNORMAL HIGH (ref 3.5–5.1)
Sodium: 138 mmol/L (ref 135–145)

## 2021-09-24 LAB — CBG MONITORING, ED
Glucose-Capillary: 191 mg/dL — ABNORMAL HIGH (ref 70–99)
Glucose-Capillary: 132 mg/dL — ABNORMAL HIGH (ref 70–99)
Glucose-Capillary: 145 mg/dL — ABNORMAL HIGH (ref 70–99)

## 2021-09-24 LAB — GLUCOSE, CAPILLARY
Glucose-Capillary: 104 mg/dL — ABNORMAL HIGH (ref 70–99)
Glucose-Capillary: 70 mg/dL (ref 70–99)
Glucose-Capillary: 124 mg/dL — ABNORMAL HIGH (ref 70–99)

## 2021-09-24 LAB — POTASSIUM: Potassium: 4 mmol/L (ref 3.5–5.1)

## 2021-09-24 SURGERY — MINIMALLY INVASIVE (MIS) TRANSFORAMINAL LUMBAR INTERBODY FUSION (TLIF) 1 LEVEL
Anesthesia: General | Site: Spine Lumbar

## 2021-09-24 MED ORDER — CEFAZOLIN SODIUM-DEXTROSE 2-3 GM-%(50ML) IV SOLR
INTRAVENOUS | Status: DC | PRN
  Administered 2021-09-24: 2 g via INTRAVENOUS

## 2021-09-24 MED ORDER — AMISULPRIDE (ANTIEMETIC) 5 MG/2ML IV SOLN: 10.0000 mg | Freq: Once | INTRAVENOUS | Status: DC | PRN

## 2021-09-24 MED ORDER — ROCURONIUM BROMIDE 10 MG/ML (PF) SYRINGE
PREFILLED_SYRINGE | INTRAVENOUS | Status: DC | PRN
  Administered 2021-09-24: 20 mg via INTRAVENOUS
  Administered 2021-09-24: 30 mg via INTRAVENOUS
  Administered 2021-09-24: 70 mg via INTRAVENOUS

## 2021-09-24 MED ORDER — LIDOCAINE 2% (20 MG/ML) 5 ML SYRINGE
INTRAMUSCULAR | Status: DC | PRN
  Administered 2021-09-24: 3 mg via INTRAVENOUS

## 2021-09-24 MED ORDER — LIDOCAINE-EPINEPHRINE 1 %-1:100000 IJ SOLN
INTRAMUSCULAR | Status: DC | PRN
  Administered 2021-09-24: 13 mL

## 2021-09-24 MED ORDER — CEFAZOLIN SODIUM-DEXTROSE 2-4 GM/100ML-% IV SOLN
INTRAVENOUS | Status: AC
  Filled 2021-09-24: qty 100

## 2021-09-24 MED ORDER — ONDANSETRON HCL 4 MG/2ML IJ SOLN
INTRAMUSCULAR | Status: DC | PRN
  Administered 2021-09-24: 4 mg via INTRAVENOUS

## 2021-09-24 MED ORDER — PHENYLEPHRINE 80 MCG/ML (10ML) SYRINGE FOR IV PUSH (FOR BLOOD PRESSURE SUPPORT)
PREFILLED_SYRINGE | INTRAVENOUS | Status: DC | PRN
  Administered 2021-09-24: 80 ug via INTRAVENOUS

## 2021-09-24 MED ORDER — FENTANYL CITRATE (PF) 100 MCG/2ML IJ SOLN: 25.0000 ug | INTRAMUSCULAR | Status: DC | PRN

## 2021-09-24 MED ORDER — ACETAMINOPHEN 500 MG PO TABS
1000.0000 mg | ORAL_TABLET | Freq: Once | ORAL | Status: AC
Start: 2021-09-24 — End: 2021-09-24
  Administered 2021-09-24: 1000 mg via ORAL
  Filled 2021-09-24: qty 2

## 2021-09-24 MED ORDER — SUGAMMADEX SODIUM 200 MG/2ML IV SOLN
INTRAVENOUS | Status: DC | PRN
  Administered 2021-09-24: 300 mg via INTRAVENOUS

## 2021-09-24 MED ORDER — HYDRALAZINE HCL 25 MG PO TABS
25.0000 mg | ORAL_TABLET | Freq: Three times a day (TID) | ORAL | Status: DC
  Administered 2021-09-24 – 2021-09-25 (×4): 25 mg via ORAL
  Filled 2021-09-24 (×4): qty 1

## 2021-09-24 MED ORDER — PROPOFOL 10 MG/ML IV BOLUS
INTRAVENOUS | Status: DC | PRN
  Administered 2021-09-24: 120 mg via INTRAVENOUS

## 2021-09-24 MED ORDER — LIDOCAINE-EPINEPHRINE 1 %-1:100000 IJ SOLN
INTRAMUSCULAR | Status: AC
  Filled 2021-09-24: qty 1

## 2021-09-24 MED ORDER — DEXTROSE 50 % IV SOLN
25.0000 mL | Freq: Once | INTRAVENOUS | Status: AC
  Administered 2021-09-24: 25 mL via INTRAVENOUS

## 2021-09-24 MED ORDER — DEXAMETHASONE SODIUM PHOSPHATE 10 MG/ML IJ SOLN
INTRAMUSCULAR | Status: DC | PRN
  Administered 2021-09-24: 10 mg via INTRAVENOUS

## 2021-09-24 MED ORDER — THROMBIN 5000 UNITS EX SOLR
CUTANEOUS | Status: AC
  Filled 2021-09-24: qty 5000

## 2021-09-24 MED ORDER — 0.9 % SODIUM CHLORIDE (POUR BTL) OPTIME
TOPICAL | Status: DC | PRN
  Administered 2021-09-24: 1000 mL

## 2021-09-24 MED ORDER — FENTANYL CITRATE (PF) 100 MCG/2ML IJ SOLN
INTRAMUSCULAR | Status: AC
  Filled 2021-09-24: qty 2

## 2021-09-24 MED ORDER — PHENYLEPHRINE HCL-NACL 20-0.9 MG/250ML-% IV SOLN
INTRAVENOUS | Status: DC | PRN
  Administered 2021-09-24: 25 ug/min via INTRAVENOUS

## 2021-09-24 MED ORDER — FENTANYL CITRATE (PF) 250 MCG/5ML IJ SOLN
INTRAMUSCULAR | Status: DC | PRN
  Administered 2021-09-24: 50 ug via INTRAVENOUS
  Administered 2021-09-24: 100 ug via INTRAVENOUS

## 2021-09-24 MED ORDER — CHLORHEXIDINE GLUCONATE 0.12 % MT SOLN
OROMUCOSAL | Status: AC
  Filled 2021-09-24: qty 15

## 2021-09-24 MED ORDER — FENTANYL CITRATE (PF) 250 MCG/5ML IJ SOLN
INTRAMUSCULAR | Status: AC
  Filled 2021-09-24: qty 5

## 2021-09-24 MED ORDER — THROMBIN 5000 UNITS EX SOLR: OROMUCOSAL | Status: DC | PRN

## 2021-09-24 SURGICAL SUPPLY — 51 items
BAG COUNTER SPONGE SURGICOUNT (BAG) ×3 IMPLANT
BAND RUBBER #18 3X1/16 STRL (MISCELLANEOUS) ×6 IMPLANT
BASKET BONE COLLECTION (BASKET) ×3 IMPLANT
BLADE SURG 11 STRL SS (BLADE) ×3 IMPLANT
BUR MATCHSTICK NEURO 3.0 LAGG (BURR) IMPLANT
BUR ROUND FLUTED 4 SOFT TCH (BURR) ×3 IMPLANT
BUR ROUND PRECISION 4.0 (BURR) IMPLANT
CNTNR URN SCR LID CUP LEK RST (MISCELLANEOUS) ×2 IMPLANT
CONT SPEC 4OZ STRL OR WHT (MISCELLANEOUS) ×3
COVER BACK TABLE 60X90IN (DRAPES) ×3 IMPLANT
DERMABOND ADVANCED (GAUZE/BANDAGES/DRESSINGS) ×1
DERMABOND ADVANCED .7 DNX12 (GAUZE/BANDAGES/DRESSINGS) ×2 IMPLANT
DRAPE C-ARM 42X72 X-RAY (DRAPES) ×3 IMPLANT
DRAPE C-ARMOR (DRAPES) ×3 IMPLANT
DRAPE LAPAROTOMY 100X72X124 (DRAPES) ×3 IMPLANT
DRAPE MICROSCOPE LEICA (MISCELLANEOUS) ×3 IMPLANT
ELECT BLADE 6.5 EXT (BLADE) ×3 IMPLANT
ELECT REM PT RETURN 9FT ADLT (ELECTROSURGICAL) ×3
ELECTRODE REM PT RTRN 9FT ADLT (ELECTROSURGICAL) ×2 IMPLANT
EXTENDER TAB GUIDE SV 5.5/6.0 (INSTRUMENTS) ×8 IMPLANT
GLOVE BIOGEL PI IND STRL 7.5 (GLOVE) ×2 IMPLANT
GLOVE BIOGEL PI INDICATOR 7.5 (GLOVE) ×2
GLOVE ECLIPSE 7.5 STRL STRAW (GLOVE) ×4 IMPLANT
GOWN STRL REUS W/ TWL LRG LVL3 (GOWN DISPOSABLE) ×2 IMPLANT
GOWN STRL REUS W/ TWL XL LVL3 (GOWN DISPOSABLE) IMPLANT
GOWN STRL REUS W/TWL 2XL LVL3 (GOWN DISPOSABLE) IMPLANT
GOWN STRL REUS W/TWL LRG LVL3 (GOWN DISPOSABLE) ×6
GOWN STRL REUS W/TWL XL LVL3 (GOWN DISPOSABLE) ×6
HEMOSTAT POWDER KIT SURGIFOAM (HEMOSTASIS) ×3 IMPLANT
KIT BASIN OR (CUSTOM PROCEDURE TRAY) ×3 IMPLANT
KIT POSITION SURG JACKSON T1 (MISCELLANEOUS) ×3 IMPLANT
KIT TURNOVER KIT B (KITS) IMPLANT
MARKER SPHERE PSV REFLC NDI (MISCELLANEOUS) ×7 IMPLANT
NEEDLE HYPO 22GX1.5 SAFETY (NEEDLE) ×3 IMPLANT
NS IRRIG 1000ML POUR BTL (IV SOLUTION) ×3 IMPLANT
PACK LAMINECTOMY NEURO (CUSTOM PROCEDURE TRAY) ×3 IMPLANT
PAD ARMBOARD 7.5X6 YLW CONV (MISCELLANEOUS) ×6 IMPLANT
PIN BONE FIX 100 (PIN) ×1 IMPLANT
PUTTY GRAFTON DBF 6CC W/DELIVE (Putty) ×2 IMPLANT
ROD 5.5X45MM SOLERA VOYAGER (Rod) ×2 IMPLANT
SCREW CANN MA SOL 6.5.50 (Screw) ×2 IMPLANT
SCREW CANN MA SOL 6.5X45 (Screw) ×2 IMPLANT
SCREW CANN MA SOL 6.5X50 (Screw) IMPLANT
SCREW SET 5.5/6.0MM SOLERA (Screw) ×4 IMPLANT
SPACER PL CATALYFT LONG 11 (Spacer) ×1 IMPLANT
SUT MNCRL AB 3-0 PS2 18 (SUTURE) ×3 IMPLANT
SUT VIC AB 2-0 CP2 18 (SUTURE) ×3 IMPLANT
TOWEL GREEN STERILE (TOWEL DISPOSABLE) ×3 IMPLANT
TOWEL GREEN STERILE FF (TOWEL DISPOSABLE) ×3 IMPLANT
TRAY FOLEY MTR SLVR 16FR STAT (SET/KITS/TRAYS/PACK) ×1 IMPLANT
WATER STERILE IRR 1000ML POUR (IV SOLUTION) ×3 IMPLANT

## 2021-09-24 NOTE — Transfer of Care (Signed)
Immediate Anesthesia Transfer of Care Note  Patient: [name]  Procedure(s) Performed: LUMBAR FOUR-FIVE MINIMALLY INVASIVE (MIS) TRANSFORAMINAL LUMBAR INTERBODY FUSION (TLIF) WITH METRX (Spine Lumbar) Application of O-Arm  Patient Location: PACU  Anesthesia Type:General  Level of Consciousness: drowsy  Airway & Oxygen Therapy: Patient Spontanous Breathing and Patient connected to nasal cannula oxygen  Post-op Assessment: Report given to RN, Post -op Vital signs reviewed and stable and Patient moving all extremities  Post vital signs: Reviewed and stable  Last Vitals:  Vitals Value Taken Time  BP    Temp    Pulse 71 09/24/21 2147  Resp 14 09/24/21 2147  SpO2 96 % 09/24/21 2147  Vitals shown include unvalidated device data.  Last Pain:  Vitals:   09/24/21 1521  TempSrc:   PainSc: 7          Complications: No notable events documented.

## 2021-09-24 NOTE — Anesthesia Procedure Notes (Signed)
Procedure Name: Intubation Date/Time: 09/24/2021 6:33 PM  Performed by: Eligha Bridegroom, CRNAPre-anesthesia Checklist: Patient identified, Emergency Drugs available, Suction available and Patient being monitored Patient Re-evaluated:Patient Re-evaluated prior to induction Oxygen Delivery Method: Circle system utilized Induction Type: IV induction Ventilation: Mask ventilation without difficulty and Oral airway inserted - appropriate to patient size Laryngoscope Size: Mac and 4 Grade View: Grade II Tube type: Oral Tube size: 8.0 mm Number of attempts: 1 Airway Equipment and Method: Stylet Placement Confirmation: ETT inserted through vocal cords under direct vision, breath sounds checked- equal and bilateral and positive ETCO2 Secured at: 23 cm Tube secured with: Tape Dental Injury: Teeth and Oropharynx as per pre-operative assessment

## 2021-09-24 NOTE — Anesthesia Preprocedure Evaluation (Addendum)
Anesthesia Evaluation  Patient identified by MRN, date of birth, ID band Patient awake    Reviewed: Allergy & Precautions, NPO status , Patient's Chart, lab work & pertinent test results  Airway Mallampati: III  TM Distance: >3 FB Neck ROM: Full  Mouth opening: Limited Mouth Opening  Dental  (+) Dental Advisory Given, Upper Dentures   Pulmonary sleep apnea and Continuous Positive Airway Pressure Ventilation ,    breath sounds clear to auscultation       Cardiovascular hypertension, Pt. on medications + CAD, + Cardiac Stents and + Peripheral Vascular Disease   Rhythm:Regular Rate:Normal  Echo: 1. Left ventricular ejection fraction, by estimation, is 55 to 60%. The  left ventricle has normal function. The left ventricle has no regional  wall motion abnormalities. Left ventricular diastolic parameters are  indeterminate. Elevated left ventricular  end-diastolic pressure.  2. Right ventricular systolic function is normal. The right ventricular  size is normal.  3. The mitral valve is normal in structure. No evidence of mitral valve  regurgitation. No evidence of mitral stenosis.  4. The aortic valve is tricuspid. Aortic valve regurgitation is not  visualized. Aortic valve sclerosis/calcification is present, without any  evidence of aortic stenosis.  5. The inferior vena cava is normal in size with greater than 50%  respiratory variability, suggesting right atrial pressure of 3 mmHg.    Neuro/Psych  Neuromuscular disease    GI/Hepatic Neg liver ROS, GERD  ,  Endo/Other  diabetes, Type 2, Oral Hypoglycemic AgentsHypothyroidism Adrenal insufficiency   Renal/GU Renal disease     Musculoskeletal  (+) Arthritis ,   Abdominal   Peds  Hematology  (+) Blood dyscrasia, anemia ,   Anesthesia Other Findings   Reproductive/Obstetrics                          Lab Results  Component Value Date   WBC  17.1 (H) 09/22/2021   HGB 12.5 (L) 09/22/2021   HCT 43.6 09/22/2021   MCV 87.6 09/22/2021   PLT 414 (H) 09/22/2021   Lab Results  Component Value Date   CREATININE 1.28 (H) 09/24/2021   BUN 26 (H) 09/24/2021   NA 138 09/24/2021   K 4.0 09/24/2021   CL 105 09/24/2021   CO2 21 (L) 09/24/2021    Anesthesia Physical Anesthesia Plan  ASA: 3  Anesthesia Plan: General   Post-op Pain Management: Tylenol PO (pre-op)*, Gabapentin PO (pre-op)* and Minimal or no pain anticipated   Induction: Intravenous  PONV Risk Score and Plan: 2 and Dexamethasone, Ondansetron and Treatment may vary due to age or medical condition  Airway Management Planned: Oral ETT  Additional Equipment: None  Intra-op Plan:   Post-operative Plan: Extubation in OR  Informed Consent: I have reviewed the patients History and Physical, chart, labs and discussed the procedure including the risks, benefits and alternatives for the proposed anesthesia with the patient or authorized representative who has indicated his/her understanding and acceptance.     Dental advisory given  Plan Discussed with: CRNA  Anesthesia Plan Comments:         Anesthesia Quick Evaluation

## 2021-09-24 NOTE — Progress Notes (Signed)
Neurosurgery Service Progress Note  Subjective: No acute events overnight, still having severe back and RLE pain   Objective: Vitals:   09/24/21 1234 09/24/21 1400 09/24/21 1520 09/24/21 1600  BP:  (!) 145/58  138/63  Pulse:  66 67 65  Resp:  17    Temp: 97.8 F (36.6 C)     TempSrc:      SpO2:  98% 98% 97%    Physical Exam: Strength 5/5 x4 and SILTx4 except R L4 and L5 numbness  Assessment & Plan: 76 y.o. man with spondylolisthesis and radiculopathy.  -OR today for MIS decompression and TLIF  [name]  09/24/21 5:19 PM

## 2021-09-24 NOTE — Op Note (Signed)
PATIENT: [name]  DAY OF SURGERY: 09/24/21   PRE-OPERATIVE DIAGNOSIS:  Lumbar radiculopathy, lumbar spondylolisthesis   POST-OPERATIVE DIAGNOSIS:  Same   PROCEDURE:  L4-L5 minimally invasive laminectomy and transforaminal lumbar interbody fusion, use of frameless stereotactic navigation and intra-operative CT   SURGEON:  Surgeon(s) and Role:    Judith Part, MD - Primary   ANESTHESIA: ETGA   BRIEF HISTORY: This is a 76 year old man who presented with severe right lower extremity and low back pain. Workup showed impressive right greater than left foraminal stenosis with a spondylolisthesis at L4-5. His symptoms were severe and refractory, we discussed surgery in clinic after he was admitted for refractory pain. He then was readmitted for refractory pain and had been off his plavix, so I recommended that we proceed with surgery. This was discussed with the patient as well as risks, benefits, and alternatives and wished to proceed with surgery.   OPERATIVE DETAIL:  The patient was taken to the operating room and placed on the OR table in the prone position. A formal time out was performed with two patient identifiers and confirmed the operative site. Anesthesia was induced by the anesthesia team. The operative site was marked, hair was clipped with surgical clippers, the area was then prepped and draped in a sterile fashion.   A stab incision was used to place a percutaneous pin on the left and a reference array was attached. The O-arm was brought into the field and an intra-operative CT was obtained, which was then registered to the patient's anatomy with good fit. Incisions were created bilaterally using trajectories toward the L4 and L5 pedicles. After again checking landmarks to confirm good registration, a high speed drill was used to create a cortical defect and a navigated awl-tap was used to cannulate the pedicle. This was palpated then the screws were placed.  On the right side,  a navigated dilator was used through the previously created incision to dock a MetRx tube to the right L4-5 facet. The microscope was draped and brought into the field, soft tissues were cleared and anatomy was appreciated.   A right L4-5 facetectomy was performed and the right L4 nerve root was decompressed along its entire course. The tube was wanded medially for performance of the laminectomy. The lamina was removed ipsilaterally and then decompression was carried contralaterally until reaching the contralateral foramen. This was obviously more than what was needed for instrumentation alone. The tube was wanded back to the disc space ipsilaterally. The disc space was identified, incised, and a discectomy was performed in the standard fashion using navigated instruments with navigation. The endplates were prepped, bone graft was packed into the disc space, and an expandable cage ([employer]) was packed with autograft and placed into the disc space with fluoroscopic confirmation. The tube was removed and hemostasis was obtained during its removal.   Using navigation with the same technique as previously, pedicle screws were then placed on the right at L4 and L5. A rod was sized and introduced on both sides, confirmed with xray, then final tightened. Hemostasis was again confirmed for both incisions, they were copiously irrigated, and then closed in layers.    EBL:  61m   DRAINS: none   SPECIMENS: none   TJudith Part MD 09/24/21 5:20 PM

## 2021-09-24 NOTE — Progress Notes (Signed)
PROGRESS NOTE  [name]  OXB:[id number] DOB: June 16, 1945 DOA: 09/22/2021 PCP: Shirline Frees, MD   Brief Narrative:  Patient is a 76 year old male with history of renal insufficiency, stage III CKD, coronary disease status post stent, diabetes type 2, sarcoidosis, OSA on CPAP, hypothyroidism who presented with back pain.  Patient was recently admitted here from 7/9-13 for intractable back pain from lumbar radiculopathy and was treated with steroids.  He was scheduled for elective surgery by neurosurgery but his back pain worsened and he came to the emergency department.  Neurosurgery following.  Planning for L4-L5 MIS decompression.  Assessment & Plan:  Principal Problem:   Back pain Active Problems:   Sarcoidosis   OSA (obstructive sleep apnea)   Coronary atherosclerosis of native coronary artery   Uncontrolled type 2 diabetes mellitus with hyperglycemia, with long-term current use of insulin (HCC)   Hyperlipidemia   Hypothyroidism   Herniated lumbar intervertebral disc   Intractable back pain: Presented with worsening back pain.  Recently admitted here and was treated with the steroids.  MRI showed anterolisthesis, disc bulge and right foraminal disc extrusion at L4-5 resulting in mild to moderate spinal canal stenosis and severe bilateral neural foraminal narrowing, greater on the right side, with impingement of the bilateral L4 nerve roots.  Continue supportive care, pain management.  On Neurontin and tapering prednisone.  Neurosurgery planning for  L4-L5 MIS decompression. We will consult PT/OT when appropriate  Diabetes type 2: Very poorly controlled, recent hemoglobin A1c of 11.9.  On 70/30 insulin at home.  Also on sliding scale  Hypertension: Monitor blood pressure.  Continue hydralazine with increased dose  Coronary artery disease: Status post DES in 2022.  On aspirin and Plavix at home.  Cardiology was  consulted for clearance.  Recent echo was normal. No need for ischemic  evaluation as per cardiology.  AKI versus CKD stage II: Kidney function fluctuating.  Currently near baseline.  Lab work showed hyperkalemia but most likely associated with hemolysis, will recheck potassium level  History of sarcoidosis: On albuterol, Singulair.  Takes prednisone 10 mg daily at home.  Leukocytosis: Most likely this is from steroid.  Low suspicion for infectious etiology.  Continue to monitor  Hyperlipidemia: On Crestor  Hypothyroidism: On Synthyroid  OSA : On CPAP:        DVT prophylaxis:SCDs Start: 09/23/21 6834     Code Status: Full Code  Family Communication: wife at bedside  Patient status:Inpatient  Patient is from :Home  Anticipated discharge HD:QQIW  Estimated DC date:1-2 days, needs neurosurgery clearance   Consultants: Neurosurgery  Procedures: None  Antimicrobials:  Anti-infectives (From admission, onward)    None       Subjective: Patient seen and examined at the bedside this morning.  Hemodynamically stable.  Wife at bedside.  Complains of back pain rates as 6/10.  Overall looks comfortable  Objective: Vitals:   09/24/21 0216 09/24/21 0218 09/24/21 0229 09/24/21 0348  BP:    (!) 169/73  Pulse:  66  63  Resp: 18   18  Temp:   98.3 F (36.8 C)   TempSrc:    Oral  SpO2:  98%  97%   No intake or output data in the 24 hours ending 09/24/21 0724 There were no vitals filed for this visit.  Examination:  General exam: Overall comfortable, not in distress HEENT: PERRL Respiratory system:  no wheezes or crackles  Cardiovascular system: S1 & S2 heard, RRR.  Gastrointestinal system: Abdomen is nondistended, soft and  nontender. Central nervous system: Alert and oriented Extremities: No edema, no clubbing ,no cyanosis Skin: No rashes, no ulcers,no icterus     Data Reviewed: I have personally reviewed following labs and imaging studies  CBC: Recent Labs  Lab 09/22/21 1704  WBC 17.1*  NEUTROABS 13.0*  HGB 12.5*  HCT 43.6   MCV 87.6  PLT 449*   Basic Metabolic Panel: Recent Labs  Lab 09/22/21 1704  NA 138  K 4.0  CL 99  CO2 26  GLUCOSE 191*  BUN 20  CREATININE 1.85*  CALCIUM 9.0     No results found for this or any previous visit (from the past 240 hour(s)).   Radiology Studies: No results found.  Scheduled Meds:  aspirin EC  81 mg Oral QPM   docusate sodium  100 mg Oral BID   gabapentin  100 mg Oral TID   hydrALAZINE  10 mg Oral Q8H   insulin aspart  0-15 Units Subcutaneous TID WC   insulin aspart  0-5 Units Subcutaneous QHS   insulin aspart protamine- aspart  40 Units Subcutaneous TID with meals   levothyroxine  75 mcg Oral Q0600   predniSONE  40 mg Oral Q breakfast   Followed by   Derrill Memo ON 09/26/2021] predniSONE  30 mg Oral Q breakfast   Followed by   Derrill Memo ON 09/29/2021] predniSONE  20 mg Oral Q breakfast   Followed by   Derrill Memo ON 10/02/2021] predniSONE  10 mg Oral Q breakfast   rosuvastatin  20 mg Oral Daily   Continuous Infusions:  lactated ringers 75 mL/hr at 09/23/21 1129   methocarbamol (ROBAXIN) IV       LOS: 1 day   Shelly Coss, MD [facility] P7/21/2023, 7:24 AM

## 2021-09-24 NOTE — ED Notes (Signed)
  Assisted patient to the bathroom in wheelchair, states his pain was coming back, medication given . IV had come out IV consult made.

## 2021-09-24 NOTE — Anesthesia Postprocedure Evaluation (Signed)
Anesthesia Post Note  Patient: [name]  Procedure(s) Performed: LUMBAR FOUR-FIVE MINIMALLY INVASIVE (MIS) TRANSFORAMINAL LUMBAR INTERBODY FUSION (TLIF) WITH METRX (Spine Lumbar) Application of O-Arm     Patient location during evaluation: PACU Anesthesia Type: General Level of consciousness: awake and alert Pain management: pain level controlled Vital Signs Assessment: post-procedure vital signs reviewed and stable Respiratory status: spontaneous breathing, nonlabored ventilation and respiratory function stable Cardiovascular status: stable and blood pressure returned to baseline Anesthetic complications: no   No notable events documented.  Last Vitals:  Vitals:   09/24/21 2200 09/24/21 2215  BP: (!) 111/59 (!) 117/54  Pulse: 70 69  Resp: 14 12  Temp:    SpO2: 93% 95%    Last Pain:  Vitals:   09/24/21 2215  TempSrc:   PainSc: 0-No pain    LLE Motor Response: Purposeful movement;Responds to commands (09/24/21 2215) LLE Sensation: Full sensation (09/24/21 2215) RLE Motor Response: Purposeful movement;Responds to commands (09/24/21 2215) RLE Sensation: Full sensation (09/24/21 2215)      Audry Pili

## 2021-09-24 NOTE — Progress Notes (Signed)
Neurosurgery Service Post-operative progress note  Assessment & Plan: 76 y.o. man s/p L4-5 MIS lami / TLIF, seen in PACU, full strength and recovering well, preop radicular symptoms resolved.  -activity as tolerated, no restrictions -I've ordered AP/lateral L-spine xrays, can be done routine  Judith Part  09/24/21 9:32 PM

## 2021-09-25 ENCOUNTER — Inpatient Hospital Stay (HOSPITAL_COMMUNITY): Payer: Medicare HMO

## 2021-09-25 LAB — BASIC METABOLIC PANEL
Anion gap: 7 (ref 5–15)
BUN: 18 mg/dL (ref 8–23)
CO2: 21 mmol/L — ABNORMAL LOW (ref 22–32)
Calcium: 8.2 mg/dL — ABNORMAL LOW (ref 8.9–10.3)
Chloride: 105 mmol/L (ref 98–111)
Creatinine, Ser: 1.12 mg/dL (ref 0.61–1.24)
GFR, Estimated: >60 mL/min (ref >60)
Glucose, Bld: 92 mg/dL (ref 70–99)
Potassium: 4.8 mmol/L (ref 3.5–5.1)
Sodium: 133 mmol/L — ABNORMAL LOW (ref 135–145)

## 2021-09-25 LAB — GLUCOSE, CAPILLARY
Glucose-Capillary: 102 mg/dL — ABNORMAL HIGH (ref 70–99)
Glucose-Capillary: 96 mg/dL (ref 70–99)
Glucose-Capillary: 327 mg/dL — ABNORMAL HIGH (ref 70–99)
Glucose-Capillary: 364 mg/dL — ABNORMAL HIGH (ref 70–99)

## 2021-09-25 LAB — CBC
HCT: 37.7 % — ABNORMAL LOW (ref 39.0–52.0)
Hemoglobin: 11 g/dL — ABNORMAL LOW (ref 13.0–17.0)
MCH: 26 pg (ref 26.0–34.0)
MCHC: 29.2 g/dL — ABNORMAL LOW (ref 30.0–36.0)
MCV: 89.1 fL (ref 80.0–100.0)
Platelets: 282 10*3/uL (ref 150–400)
RBC: 4.23 MIL/uL (ref 4.22–5.81)
RDW: 21.1 % — ABNORMAL HIGH (ref 11.5–15.5)
WBC: 21.3 10*3/uL — ABNORMAL HIGH (ref 4.0–10.5)
nRBC: 0 % (ref 0.0–0.2)

## 2021-09-25 MED ORDER — CLOPIDOGREL BISULFATE 75 MG PO TABS: 75.0000 mg | ORAL_TABLET | Freq: Every day | ORAL | 4 refills | Status: AC

## 2021-09-25 MED ORDER — OXYCODONE HCL 5 MG PO TABS
5.0000 mg | ORAL_TABLET | ORAL | 0 refills | Status: DC | PRN
Start: 2021-09-25 — End: 2021-09-25

## 2021-09-25 MED ORDER — OXYCODONE HCL 5 MG PO TABS
5.0000 mg | ORAL_TABLET | ORAL | 0 refills | Status: DC | PRN
Start: 2021-09-25 — End: 2022-06-17

## 2021-09-25 NOTE — Discharge Instructions (Signed)
Discharge Instructions ° °No restriction in activities, slowly increase your activity back to normal.  ° °Your incision is closed with dermabond (purple glue). This will naturally fall off over the next 1-2 weeks.  ° °Okay to shower on the day of discharge. Use regular soap and water and try to be gentle when cleaning your incision.  ° °Follow up with Dr. Shafin Pollio in 2 weeks after discharge. If you do not already have a discharge appointment, please call his office at [phone] to schedule a follow up appointment. If you have any concerns or questions, please call the office and let us know. °

## 2021-09-25 NOTE — Plan of Care (Signed)
  Problem: Education: Goal: Knowledge of General Education information will improve Description: Including pain rating scale, medication(s)/side effects and non-pharmacologic comfort measures Outcome: Adequate for Discharge   Problem: Health Behavior/Discharge Planning: Goal: Ability to manage health-related needs will improve Outcome: Adequate for Discharge   Problem: Clinical Measurements: Goal: Ability to maintain clinical measurements within normal limits will improve Outcome: Adequate for Discharge Goal: Will remain free from infection Outcome: Adequate for Discharge Goal: Diagnostic test results will improve Outcome: Adequate for Discharge Goal: Respiratory complications will improve Outcome: Adequate for Discharge Goal: Cardiovascular complication will be avoided Outcome: Adequate for Discharge   Problem: Clinical Measurements: Goal: Diagnostic test results will improve Outcome: Adequate for Discharge   Problem: Clinical Measurements: Goal: Will remain free from infection Outcome: Adequate for Discharge   Problem: Coping: Goal: Level of anxiety will decrease Outcome: Adequate for Discharge

## 2021-09-25 NOTE — Discharge Summary (Signed)
Discharge Summary  Date of Admission: 09/22/2021  Date of Discharge: 09/25/21  Attending Physician: Heath Lark, MD  Hospital Course: Patient was admitted from the ED due to recurrent severe radicular pain. He was taken to the OR for an L4-5 MIS lami / TLIF, recovered in PACU and transferred to 4NP. His preop symptoms were completely resolved, his hospital course was uncomplicated and the patient was discharged home on 09/25/21. They will follow up in clinic with me in clinic in 2 weeks.  Neurologic exam at discharge:  Strength 5/5 x4 and SILTx4   Discharge diagnosis: Lumbar radiculopathy, lumbar spondylolisthesis  Judith Part, MD 09/25/21 9:58 AM

## 2021-09-25 NOTE — Progress Notes (Signed)
Neurosurgery Service Progress Note  Subjective: No acute events overnight, radicular pain resolved immediately post-op, he's very happy, wished he had done this years ago   Objective: Vitals:   09/24/21 2240 09/24/21 2303 09/25/21 0304 09/25/21 0746  BP:  138/67 (!) 119/58 (!) 158/66  Pulse: 63 68 60 69  Resp: '17 10 11 17  '$ Temp: 98.2 F (36.8 C) 97.7 F (36.5 C) 97.7 F (36.5 C)   TempSrc:  Oral Oral   SpO2: 94% 96% 91% 94%    Physical Exam: Strength 5/5 x4 and SILTx4, incision c/d/i  Assessment & Plan: 76 y.o. man w/ L4-5 spondy / severe foram stenosis w/ refractory pain s/p L4-5 MIS lami / TLIF, recovering well.  -xrays show hardware in good position -can discharge home later today after working with PT from my perspective, will put in his discharge instructions / summary and pend his orders in Epic. I will be in the OR most of the day today so if he's doing well then can discharge home, will put f/u info in Epic as well   Judith Part  09/25/21 9:56 AM

## 2021-09-25 NOTE — Progress Notes (Signed)
Pt admitted to the unit from pacu overnight. Pt resting with no complaints during shift. MAE x4, no skin rashes or wounds except for bilateral back surgical incision with skin glue, opened to air. Pt foley removed at 0657 with no discharge noted, peri-care done and pt due to void. 625 ml emptied from urinal. No issues during the night. Reported off to oncoming RN. Delia Heady RN

## 2021-09-25 NOTE — Evaluation (Signed)
Occupational Therapy Evaluation Patient Details Name: [name] MRN: [id number] DOB: January 19, 1946 Today's Date: 09/25/2021   History of Present Illness Pt is a 76 y/o male admitted secondary to HTN, dehydration and increased back pain on 7/19. Pt is s/p L4-5 minimally invasive TLIF. PMH includes DM, CKD, CAD s/p stent, OSA on CPAP.   Clinical Impression   Patient admitted for the procedure above.  Subjectively states he is feeling much better.  Patient needing Mod I for bed mobility, and generalized supervision with cues for ADL from a sit to stand level.  Patient is Min Guard for initial sit to stand, but transitions to supervision for in room mobility.  Patient has all needed DME and AE at home, and no post acute OT is anticipated.  If HH PT believes [name] OT is needed, they can contact the patient's PCP and request that order.  Spouse is able to provided needed support.        Recommendations for follow up therapy are one component of a multi-disciplinary discharge planning process, led by the attending physician.  Recommendations may be updated based on patient status, additional functional criteria and insurance authorization.   Follow Up Recommendations  No OT follow up    Assistance Recommended at Discharge Intermittent Supervision/Assistance  Patient can return home with the following A little help with walking and/or transfers;A little help with bathing/dressing/bathroom;Assistance with cooking/housework;Assist for transportation    Functional Status Assessment  Patient has had a recent decline in their functional status and demonstrates the ability to make significant improvements in function in a reasonable and predictable amount of time.  Equipment Recommendations  None recommended by OT    Recommendations for Other Services       Precautions / Restrictions Precautions Precautions: Fall;Back Precaution Booklet Issued: Yes (comment) Precaution Comments: Reviewed back  precautions Restrictions Weight Bearing Restrictions: No      Mobility Bed Mobility Overal bed mobility: Modified Independent                  Transfers Overall transfer level: Needs assistance Equipment used: Rolling walker (2 wheels) Transfers: Sit to/from Stand Sit to Stand: Min assist                  Balance Overall balance assessment: Needs assistance Sitting-balance support: No upper extremity supported, Feet supported Sitting balance-Leahy Scale: Good     Standing balance support: Reliant on assistive device for balance, Bilateral upper extremity supported Standing balance-Leahy Scale: Fair                             ADL either performed or assessed with clinical judgement   ADL                   Upper Body Dressing : Set up;Sitting   Lower Body Dressing: Supervision/safety;Sit to/from stand   Toilet Transfer: Supervision/safety;Rolling walker (2 wheels);Ambulation                   Vision Baseline Vision/History: 1 Wears glasses Patient Visual Report: No change from baseline       Perception Perception Perception: Within Functional Limits   Praxis Praxis Praxis: Intact    Pertinent Vitals/Pain Pain Assessment Pain Assessment: Faces Faces Pain Scale: Hurts little more Pain Location: back Pain Descriptors / Indicators: Grimacing, Guarding Pain Intervention(s): Monitored during session     Hand Dominance Right   Extremity/Trunk Assessment Upper Extremity Assessment Upper  Extremity Assessment: Overall WFL for tasks assessed   Lower Extremity Assessment Lower Extremity Assessment: Defer to PT evaluation RLE Deficits / Details: Reports RLE pain is better and he can feel his R foot.   Cervical / Trunk Assessment Cervical / Trunk Assessment: Back Surgery   Communication Communication Communication: No difficulties   Cognition Arousal/Alertness: Awake/alert Behavior During Therapy: WFL for tasks  assessed/performed Overall Cognitive Status: Within Functional Limits for tasks assessed                                       General Comments   VSS on RA    Exercises     Shoulder Instructions      Home Living Family/patient expects to be discharged to:: Private residence Living Arrangements: Spouse/significant other Available Help at Discharge: Family;Available 24 hours/day Type of Home: House Home Access: Ramped entrance Entrance Stairs-Number of Steps: 6.  Pt uses stairs but has ramp with rails to 3rd entrace to house if needed. Entrance Stairs-Rails: Can reach both Home Layout: One level     Bathroom Shower/Tub: Occupational psychologist: Handicapped height     Home Equipment: Conservation officer, nature (2 wheels);Rollator (4 wheels);Electric scooter;Wheelchair - manual;BSC/3in1;Grab bars - toilet;Grab bars - tub/shower;Hand held shower head;Shower seat - built in;Shower seat   Additional Comments: Hearing Aids. 2 scooters: one for inside and one for car with lift.      Prior Functioning/Environment Prior Level of Function : Needs assist             Mobility Comments: Uses electric scooter most of the time and uses rollator for very short distances secondary to back and LE pain. ADLs Comments: [name] assists with ADLs        OT Problem List: Pain;Decreased activity tolerance;Decreased knowledge of use of DME or AE;Impaired balance (sitting and/or standing);Decreased knowledge of precautions      OT Treatment/Interventions:      OT Goals(Current goals can be found in the care plan section) Acute Rehab OT Goals Patient Stated Goal: Hoping to discharge home today OT Goal Formulation: With patient Time For Goal Achievement: 09/27/21 Potential to Achieve Goals: Good  OT Frequency:      Co-evaluation              AM-PAC OT "6 Clicks" Daily Activity     Outcome Measure Help from another person eating meals?: None Help from another person  taking care of personal grooming?: None Help from another person toileting, which includes using toliet, bedpan, or urinal?: A Little Help from another person bathing (including washing, rinsing, drying)?: A Little Help from another person to put on and taking off regular upper body clothing?: None Help from another person to put on and taking off regular lower body clothing?: A Little 6 Click Score: 21   End of Session Equipment Utilized During Treatment: Rolling walker (2 wheels) Nurse Communication: Other (comment) (ready for d/c)  Activity Tolerance: Patient tolerated treatment well Patient left: in bed;with call bell/phone within reach;with family/visitor present  OT Visit Diagnosis: History of falling (Z91.81);Muscle weakness (generalized) (M62.81);Pain;Unsteadiness on feet (R26.81)                Time: 7782-4235 OT Time Calculation (min): 21 min Charges:  OT General Charges $OT Visit: 1 Visit OT Evaluation $OT Eval Moderate Complexity: 1 Mod  09/25/2021  RP, OTR/L  Acute Rehabilitation Services  Office:  [place] 09/25/2021, 4:32 PM

## 2021-09-25 NOTE — Progress Notes (Signed)
PT Cancellation Note  Patient Details Name: [name] MRN: [id number] DOB: October 03, 1945   Cancelled Treatment:    Reason Eval/Treat Not Completed: Pain limiting ability to participate Pt requesting to hold until he receives pain meds. Will follow up as schedule allows.   Lou Miner, DPT  Acute Rehabilitation Services  Office: [phone]  Rudean Hitt 09/25/2021, 1:23 PM

## 2021-09-25 NOTE — Evaluation (Signed)
Physical Therapy Evaluation Patient Details Name: [name] MRN: [id number] DOB: 30-Apr-1945 Today's Date: 09/25/2021  History of Present Illness  Pt is a 76 y/o male admitted secondary to HTN, dehydration and increased back pain on 7/19. Pt is s/p L4-5 minimally invasive TLIF. PMH includes DM, CKD, CAD s/p stent, OSA on CPAP.  Clinical Impression  Patient is s/p above surgery resulting in the deficits listed below (see PT Problem List). Pt reporting back soreness, but reports RLE pain and sensation much improved. Requiring min guard to min A for mobility tasks using RW. Reviewed back precautions and importance of mobility at home. Would benefit from HHPT at d/c to address deficits.  Patient will benefit from skilled PT to increase their independence and safety with mobility (while adhering to their precautions) to allow discharge to the venue listed below.        Recommendations for follow up therapy are one component of a multi-disciplinary discharge planning process, led by the attending physician.  Recommendations may be updated based on patient status, additional functional criteria and insurance authorization.  Follow Up Recommendations Home health PT      Assistance Recommended at Discharge Intermittent Supervision/Assistance  Patient can return home with the following  A little help with walking and/or transfers;A little help with bathing/dressing/bathroom;Assistance with cooking/housework;Assist for transportation;Help with stairs or ramp for entrance    Equipment Recommendations None recommended by PT  Recommendations for Other Services       Functional Status Assessment Patient has had a recent decline in their functional status and demonstrates the ability to make significant improvements in function in a reasonable and predictable amount of time.     Precautions / Restrictions Precautions Precautions: Fall;Back Precaution Booklet Issued: Yes (comment) Precaution Comments:  Reviewed back precautions Restrictions Weight Bearing Restrictions: No      Mobility  Bed Mobility Overal bed mobility: Needs Assistance Bed Mobility: Rolling, Sidelying to Sit, Sit to Sidelying Rolling: Supervision Sidelying to sit: Supervision, HOB elevated     Sit to sidelying: Min assist General bed mobility comments: Required assist for LE assist.    Transfers Overall transfer level: Needs assistance Equipment used: Rolling walker (2 wheels) Transfers: Sit to/from Stand Sit to Stand: Min assist           General transfer comment: Min A for lift assist and steadying to stand.    Ambulation/Gait Ambulation/Gait assistance: Min guard Gait Distance (Feet): 25 Feet Assistive device: Rolling walker (2 wheels) Gait Pattern/deviations: Step-through pattern, Decreased stride length Gait velocity: Decreased     General Gait Details: Unsteadiness noted, but no overt LOB. Pt reports LE pain is much improved and has improved tolerance for gait.  Stairs            Wheelchair Mobility    Modified Rankin (Stroke Patients Only)       Balance Overall balance assessment: Needs assistance Sitting-balance support: No upper extremity supported, Feet supported Sitting balance-Leahy Scale: Fair     Standing balance support: Reliant on assistive device for balance, Bilateral upper extremity supported Standing balance-Leahy Scale: Poor                               Pertinent Vitals/Pain Pain Assessment Pain Assessment: 0-10 Pain Score: 6  Pain Location: back Pain Descriptors / Indicators: Grimacing, Guarding Pain Intervention(s): Limited activity within patient's tolerance, Monitored during session, Repositioned    Home Living Family/patient expects to be discharged to::  Private residence Living Arrangements: Spouse/significant other Available Help at Discharge: Family;Available 24 hours/day Type of Home: House Home Access: Ramped entrance        Home Layout: One level Home Equipment: Conservation officer, nature (2 wheels);Rollator (4 wheels);Electric scooter;Wheelchair - manual;BSC/3in1;Grab bars - toilet;Grab bars - tub/shower;Hand held shower head;Shower seat - built in;Shower seat      Prior Function Prior Level of Function : Needs assist             Mobility Comments: Uses electric scooter most of the time and uses rollator for very short distances secondary to back and LE pain. ADLs Comments: Wife assists with ADLs     Hand Dominance        Extremity/Trunk Assessment   Upper Extremity Assessment Upper Extremity Assessment: Defer to OT evaluation    Lower Extremity Assessment Lower Extremity Assessment: RLE deficits/detail;Generalized weakness RLE Deficits / Details: Reports RLE pain is better and he can feel his R foot.    Cervical / Trunk Assessment Cervical / Trunk Assessment: Back Surgery  Communication   Communication: No difficulties  Cognition Arousal/Alertness: Awake/alert Behavior During Therapy: WFL for tasks assessed/performed Overall Cognitive Status: Within Functional Limits for tasks assessed                                          General Comments      Exercises     Assessment/Plan    PT Assessment Patient needs continued PT services  PT Problem List Decreased mobility;Pain;Decreased strength;Decreased activity tolerance;Decreased balance       PT Treatment Interventions DME instruction;Gait training;Therapeutic exercise;Balance training;Functional mobility training;Therapeutic activities;Patient/family education    PT Goals (Current goals can be found in the Care Plan section)  Acute Rehab PT Goals Patient Stated Goal: to be able to travel more PT Goal Formulation: With patient Time For Goal Achievement: 09/21/21 Potential to Achieve Goals: Good    Frequency Min 5X/week     Co-evaluation               AM-PAC PT "6 Clicks" Mobility  Outcome Measure Help  needed turning from your back to your side while in a flat bed without using bedrails?: A Little Help needed moving from lying on your back to sitting on the side of a flat bed without using bedrails?: A Little Help needed moving to and from a bed to a chair (including a wheelchair)?: A Little Help needed standing up from a chair using your arms (e.g., wheelchair or bedside chair)?: A Little Help needed to walk in hospital room?: A Little Help needed climbing 3-5 steps with a railing? : A Lot 6 Click Score: 17    End of Session Equipment Utilized During Treatment: Gait belt Activity Tolerance: Patient tolerated treatment well Patient left: in bed;with call bell/phone within reach;with family/visitor present Nurse Communication: Mobility status PT Visit Diagnosis: Difficulty in walking, not elsewhere classified (R26.2);Pain;Muscle weakness (generalized) (M62.81) Pain - part of body:  (back)    Time: 9935-7017 PT Time Calculation (min) (ACUTE ONLY): 28 min   Charges:   PT Evaluation $PT Eval Moderate Complexity: 1 Mod PT Treatments $Gait Training: 8-22 mins        Lou Miner, DPT  Acute Rehabilitation Services  Office: [phone]   Rudean Hitt 09/25/2021, 3:19 PM

## 2021-09-27 ENCOUNTER — Encounter (HOSPITAL_COMMUNITY): Payer: Self-pay | Admitting: Neurological Surgery

## 2021-09-27 DIAGNOSIS — I2510 Atherosclerotic heart disease of native coronary artery without angina pectoris: Secondary | ICD-10-CM | POA: Diagnosis not present

## 2021-09-27 DIAGNOSIS — G8929 Other chronic pain: Secondary | ICD-10-CM | POA: Diagnosis not present

## 2021-09-27 DIAGNOSIS — I1310 Hypertensive heart and chronic kidney disease without heart failure, with stage 1 through stage 4 chronic kidney disease, or unspecified chronic kidney disease: Secondary | ICD-10-CM | POA: Diagnosis not present

## 2021-09-27 DIAGNOSIS — E1122 Type 2 diabetes mellitus with diabetic chronic kidney disease: Secondary | ICD-10-CM | POA: Diagnosis not present

## 2021-09-27 DIAGNOSIS — M5441 Lumbago with sciatica, right side: Secondary | ICD-10-CM | POA: Diagnosis not present

## 2021-09-27 DIAGNOSIS — I5032 Chronic diastolic (congestive) heart failure: Secondary | ICD-10-CM | POA: Diagnosis not present

## 2021-09-27 DIAGNOSIS — M5416 Radiculopathy, lumbar region: Secondary | ICD-10-CM | POA: Diagnosis not present

## 2021-09-27 DIAGNOSIS — Z4781 Encounter for orthopedic aftercare following surgical amputation: Secondary | ICD-10-CM | POA: Diagnosis not present

## 2021-09-27 DIAGNOSIS — N183 Chronic kidney disease, stage 3 unspecified: Secondary | ICD-10-CM | POA: Diagnosis not present

## 2021-09-30 ENCOUNTER — Ambulatory Visit: Payer: Medicare HMO | Admitting: Physician Assistant

## 2021-09-30 DIAGNOSIS — D649 Anemia, unspecified: Secondary | ICD-10-CM | POA: Diagnosis not present

## 2021-09-30 DIAGNOSIS — Z09 Encounter for follow-up examination after completed treatment for conditions other than malignant neoplasm: Secondary | ICD-10-CM | POA: Diagnosis not present

## 2021-09-30 DIAGNOSIS — E871 Hypo-osmolality and hyponatremia: Secondary | ICD-10-CM | POA: Diagnosis not present

## 2021-09-30 DIAGNOSIS — M5416 Radiculopathy, lumbar region: Secondary | ICD-10-CM | POA: Diagnosis not present

## 2021-10-01 ENCOUNTER — Other Ambulatory Visit: Payer: Self-pay

## 2021-10-01 ENCOUNTER — Inpatient Hospital Stay (HOSPITAL_COMMUNITY)
Admission: EM | Admit: 2021-10-01 | Discharge: 2021-10-06 | DRG: 193 | Disposition: A | Discharge status: Home / Self Care | Payer: Medicare HMO | Attending: Family Medicine | Admitting: Family Medicine

## 2021-10-01 ENCOUNTER — Emergency Department (HOSPITAL_COMMUNITY): Payer: Medicare HMO

## 2021-10-01 ENCOUNTER — Encounter (HOSPITAL_COMMUNITY): Payer: Self-pay | Admitting: *Deleted

## 2021-10-01 ENCOUNTER — Inpatient Hospital Stay (HOSPITAL_COMMUNITY): Payer: Medicare HMO

## 2021-10-01 DIAGNOSIS — I5033 Acute on chronic diastolic (congestive) heart failure: Secondary | ICD-10-CM | POA: Diagnosis present

## 2021-10-01 DIAGNOSIS — A419 Sepsis, unspecified organism: Secondary | ICD-10-CM | POA: Diagnosis not present

## 2021-10-01 DIAGNOSIS — Z79890 Hormone replacement therapy (postmenopausal): Secondary | ICD-10-CM

## 2021-10-01 DIAGNOSIS — Z7982 Long term (current) use of aspirin: Secondary | ICD-10-CM

## 2021-10-01 DIAGNOSIS — E11649 Type 2 diabetes mellitus with hypoglycemia without coma: Secondary | ICD-10-CM | POA: Diagnosis present

## 2021-10-01 DIAGNOSIS — G4733 Obstructive sleep apnea (adult) (pediatric): Secondary | ICD-10-CM | POA: Diagnosis not present

## 2021-10-01 DIAGNOSIS — J189 Pneumonia, unspecified organism: Secondary | ICD-10-CM | POA: Diagnosis not present

## 2021-10-01 DIAGNOSIS — Z841 Family history of disorders of kidney and ureter: Secondary | ICD-10-CM

## 2021-10-01 DIAGNOSIS — Z794 Long term (current) use of insulin: Secondary | ICD-10-CM

## 2021-10-01 DIAGNOSIS — R509 Fever, unspecified: Secondary | ICD-10-CM | POA: Diagnosis not present

## 2021-10-01 DIAGNOSIS — Z9841 Cataract extraction status, right eye: Secondary | ICD-10-CM

## 2021-10-01 DIAGNOSIS — Z79899 Other long term (current) drug therapy: Secondary | ICD-10-CM

## 2021-10-01 DIAGNOSIS — Z6830 Body mass index (BMI) 30.0-30.9, adult: Secondary | ICD-10-CM | POA: Diagnosis not present

## 2021-10-01 DIAGNOSIS — E1165 Type 2 diabetes mellitus with hyperglycemia: Secondary | ICD-10-CM

## 2021-10-01 DIAGNOSIS — E7800 Pure hypercholesterolemia, unspecified: Secondary | ICD-10-CM

## 2021-10-01 DIAGNOSIS — N1831 Chronic kidney disease, stage 3a: Secondary | ICD-10-CM | POA: Diagnosis present

## 2021-10-01 DIAGNOSIS — Z888 Allergy status to other drugs, medicaments and biological substances status: Secondary | ICD-10-CM

## 2021-10-01 DIAGNOSIS — R609 Edema, unspecified: Secondary | ICD-10-CM

## 2021-10-01 DIAGNOSIS — Z825 Family history of asthma and other chronic lower respiratory diseases: Secondary | ICD-10-CM

## 2021-10-01 DIAGNOSIS — Z9842 Cataract extraction status, left eye: Secondary | ICD-10-CM

## 2021-10-01 DIAGNOSIS — D869 Sarcoidosis, unspecified: Secondary | ICD-10-CM | POA: Diagnosis not present

## 2021-10-01 DIAGNOSIS — Z833 Family history of diabetes mellitus: Secondary | ICD-10-CM

## 2021-10-01 DIAGNOSIS — R059 Cough, unspecified: Secondary | ICD-10-CM | POA: Diagnosis not present

## 2021-10-01 DIAGNOSIS — I4891 Unspecified atrial fibrillation: Secondary | ICD-10-CM | POA: Diagnosis not present

## 2021-10-01 DIAGNOSIS — T380X5A Adverse effect of glucocorticoids and synthetic analogues, initial encounter: Secondary | ICD-10-CM | POA: Diagnosis present

## 2021-10-01 DIAGNOSIS — Z20822 Contact with and (suspected) exposure to covid-19: Secondary | ICD-10-CM | POA: Diagnosis present

## 2021-10-01 DIAGNOSIS — M5116 Intervertebral disc disorders with radiculopathy, lumbar region: Secondary | ICD-10-CM | POA: Diagnosis present

## 2021-10-01 DIAGNOSIS — J9601 Acute respiratory failure with hypoxia: Secondary | ICD-10-CM | POA: Diagnosis not present

## 2021-10-01 DIAGNOSIS — E2740 Unspecified adrenocortical insufficiency: Secondary | ICD-10-CM | POA: Diagnosis not present

## 2021-10-01 DIAGNOSIS — M5126 Other intervertebral disc displacement, lumbar region: Secondary | ICD-10-CM | POA: Diagnosis present

## 2021-10-01 DIAGNOSIS — Z955 Presence of coronary angioplasty implant and graft: Secondary | ICD-10-CM

## 2021-10-01 DIAGNOSIS — I2510 Atherosclerotic heart disease of native coronary artery without angina pectoris: Secondary | ICD-10-CM | POA: Diagnosis not present

## 2021-10-01 DIAGNOSIS — E1122 Type 2 diabetes mellitus with diabetic chronic kidney disease: Secondary | ICD-10-CM | POA: Diagnosis present

## 2021-10-01 DIAGNOSIS — I700 Atherosclerosis of aorta: Secondary | ICD-10-CM | POA: Diagnosis present

## 2021-10-01 DIAGNOSIS — J40 Bronchitis, not specified as acute or chronic: Secondary | ICD-10-CM | POA: Diagnosis present

## 2021-10-01 DIAGNOSIS — E039 Hypothyroidism, unspecified: Secondary | ICD-10-CM | POA: Diagnosis present

## 2021-10-01 DIAGNOSIS — J9811 Atelectasis: Secondary | ICD-10-CM | POA: Diagnosis present

## 2021-10-01 DIAGNOSIS — Z981 Arthrodesis status: Secondary | ICD-10-CM | POA: Diagnosis not present

## 2021-10-01 DIAGNOSIS — E785 Hyperlipidemia, unspecified: Secondary | ICD-10-CM | POA: Diagnosis present

## 2021-10-01 DIAGNOSIS — E876 Hypokalemia: Secondary | ICD-10-CM | POA: Diagnosis not present

## 2021-10-01 DIAGNOSIS — Z7902 Long term (current) use of antithrombotics/antiplatelets: Secondary | ICD-10-CM

## 2021-10-01 DIAGNOSIS — Z8249 Family history of ischemic heart disease and other diseases of the circulatory system: Secondary | ICD-10-CM

## 2021-10-01 DIAGNOSIS — J181 Lobar pneumonia, unspecified organism: Secondary | ICD-10-CM | POA: Diagnosis not present

## 2021-10-01 DIAGNOSIS — J90 Pleural effusion, not elsewhere classified: Secondary | ICD-10-CM | POA: Diagnosis not present

## 2021-10-01 DIAGNOSIS — E669 Obesity, unspecified: Secondary | ICD-10-CM | POA: Diagnosis present

## 2021-10-01 DIAGNOSIS — I491 Atrial premature depolarization: Secondary | ICD-10-CM | POA: Diagnosis not present

## 2021-10-01 DIAGNOSIS — R0689 Other abnormalities of breathing: Secondary | ICD-10-CM | POA: Diagnosis not present

## 2021-10-01 DIAGNOSIS — R079 Chest pain, unspecified: Secondary | ICD-10-CM | POA: Diagnosis not present

## 2021-10-01 DIAGNOSIS — I130 Hypertensive heart and chronic kidney disease with heart failure and stage 1 through stage 4 chronic kidney disease, or unspecified chronic kidney disease: Secondary | ICD-10-CM | POA: Diagnosis present

## 2021-10-01 DIAGNOSIS — R6520 Severe sepsis without septic shock: Secondary | ICD-10-CM | POA: Diagnosis not present

## 2021-10-01 DIAGNOSIS — Z7952 Long term (current) use of systemic steroids: Secondary | ICD-10-CM

## 2021-10-01 LAB — COMPREHENSIVE METABOLIC PANEL
ALT: 19 U/L (ref 0–44)
AST: 21 U/L (ref 15–41)
Albumin: 2.3 g/dL — ABNORMAL LOW (ref 3.5–5.0)
Alkaline Phosphatase: 49 U/L (ref 38–126)
Anion gap: 6 (ref 5–15)
BUN: 12 mg/dL (ref 8–23)
CO2: 25 mmol/L (ref 22–32)
Calcium: 7.7 mg/dL — ABNORMAL LOW (ref 8.9–10.3)
Chloride: 103 mmol/L (ref 98–111)
Creatinine, Ser: 1.34 mg/dL — ABNORMAL HIGH (ref 0.61–1.24)
GFR, Estimated: 55 mL/min — ABNORMAL LOW (ref >60)
Glucose, Bld: 82 mg/dL (ref 70–99)
Potassium: 4 mmol/L (ref 3.5–5.1)
Sodium: 134 mmol/L — ABNORMAL LOW (ref 135–145)
Total Bilirubin: 0.8 mg/dL (ref 0.3–1.2)
Total Protein: 4.9 g/dL — ABNORMAL LOW (ref 6.5–8.1)

## 2021-10-01 LAB — CBC WITH DIFFERENTIAL/PLATELET
Abs Immature Granulocytes: 0.23 10*3/uL — ABNORMAL HIGH (ref 0.00–0.07)
Basophils Absolute: 0.1 10*3/uL (ref 0.0–0.1)
Basophils Relative: 0 %
Eosinophils Absolute: 0.1 10*3/uL (ref 0.0–0.5)
Eosinophils Relative: 0 %
HCT: 33.7 % — ABNORMAL LOW (ref 39.0–52.0)
Hemoglobin: 9.8 g/dL — ABNORMAL LOW (ref 13.0–17.0)
Immature Granulocytes: 1 %
Lymphocytes Relative: 5 %
Lymphs Abs: 0.9 10*3/uL (ref 0.7–4.0)
MCH: 25.9 pg — ABNORMAL LOW (ref 26.0–34.0)
MCHC: 29.1 g/dL — ABNORMAL LOW (ref 30.0–36.0)
MCV: 88.9 fL (ref 80.0–100.0)
Monocytes Absolute: 1.4 10*3/uL — ABNORMAL HIGH (ref 0.1–1.0)
Monocytes Relative: 8 %
Neutro Abs: 14.5 10*3/uL — ABNORMAL HIGH (ref 1.7–7.7)
Neutrophils Relative %: 86 %
Platelets: 250 10*3/uL (ref 150–400)
RBC: 3.79 MIL/uL — ABNORMAL LOW (ref 4.22–5.81)
RDW: 20.8 % — ABNORMAL HIGH (ref 11.5–15.5)
WBC: 17.2 10*3/uL — ABNORMAL HIGH (ref 4.0–10.5)
nRBC: 0 % (ref 0.0–0.2)

## 2021-10-01 LAB — BRAIN NATRIURETIC PEPTIDE: B Natriuretic Peptide: 141.2 pg/mL — ABNORMAL HIGH (ref 0.0–100.0)

## 2021-10-01 LAB — CBG MONITORING, ED: Glucose-Capillary: 157 mg/dL — ABNORMAL HIGH (ref 70–99)

## 2021-10-01 LAB — TROPONIN I (HIGH SENSITIVITY)
Troponin I (High Sensitivity): 64 ng/L — ABNORMAL HIGH (ref <18)
Troponin I (High Sensitivity): 40 ng/L — ABNORMAL HIGH (ref <18)

## 2021-10-01 LAB — URINALYSIS, ROUTINE W REFLEX MICROSCOPIC
Bilirubin Urine: NEGATIVE
Glucose, UA: NEGATIVE mg/dL
Ketones, ur: 5 mg/dL — AB
Leukocytes,Ua: NEGATIVE
Nitrite: NEGATIVE
Protein, ur: NEGATIVE mg/dL
Specific Gravity, Urine: 1.011 (ref 1.005–1.030)
pH: 5 (ref 5.0–8.0)

## 2021-10-01 LAB — LIPASE, BLOOD: Lipase: 20 U/L (ref 11–51)

## 2021-10-01 LAB — LACTIC ACID, PLASMA
Lactic Acid, Venous: 1.1 mmol/L (ref 0.5–1.9)
Lactic Acid, Venous: 0.8 mmol/L (ref 0.5–1.9)

## 2021-10-01 LAB — PROTIME-INR
INR: 1.1 (ref 0.8–1.2)
Prothrombin Time: 14.4 seconds (ref 11.4–15.2)

## 2021-10-01 LAB — APTT: aPTT: 30 seconds (ref 24–36)

## 2021-10-01 LAB — GLUCOSE, CAPILLARY
Glucose-Capillary: 196 mg/dL — ABNORMAL HIGH (ref 70–99)
Glucose-Capillary: 321 mg/dL — ABNORMAL HIGH (ref 70–99)

## 2021-10-01 MED ORDER — SODIUM CHLORIDE 0.9% FLUSH
3.0000 mL | Freq: Two times a day (BID) | INTRAVENOUS | Status: DC
  Administered 2021-10-02 – 2021-10-06 (×9): 3 mL via INTRAVENOUS

## 2021-10-01 MED ORDER — ACETAMINOPHEN 650 MG RE SUPP: 650.0000 mg | Freq: Four times a day (QID) | RECTAL | Status: DC | PRN

## 2021-10-01 MED ORDER — HYDROCORTISONE SOD SUC (PF) 100 MG IJ SOLR
100.0000 mg | INTRAMUSCULAR | Status: AC
  Administered 2021-10-01: 100 mg via INTRAVENOUS
  Filled 2021-10-01: qty 2

## 2021-10-01 MED ORDER — SODIUM CHLORIDE 0.9 % IV SOLN: INTRAVENOUS | Status: DC

## 2021-10-01 MED ORDER — FENTANYL CITRATE PF 50 MCG/ML IJ SOSY
PREFILLED_SYRINGE | INTRAMUSCULAR | Status: AC
  Filled 2021-10-01: qty 1

## 2021-10-01 MED ORDER — BISACODYL 5 MG PO TBEC: 5.0000 mg | DELAYED_RELEASE_TABLET | Freq: Every day | ORAL | Status: DC | PRN

## 2021-10-01 MED ORDER — ACETAMINOPHEN 325 MG PO TABS: 650.0000 mg | ORAL_TABLET | Freq: Four times a day (QID) | ORAL | Status: DC | PRN

## 2021-10-01 MED ORDER — OXYCODONE HCL 5 MG PO TABS: 5.0000 mg | ORAL_TABLET | ORAL | Status: DC | PRN

## 2021-10-01 MED ORDER — SODIUM CHLORIDE 0.9 % IV SOLN
2.0000 g | Freq: Once | INTRAVENOUS | Status: AC
  Administered 2021-10-01: 2 g via INTRAVENOUS
  Filled 2021-10-01: qty 12.5

## 2021-10-01 MED ORDER — SODIUM CHLORIDE 0.9 % IV BOLUS
500.0000 mL | Freq: Once | INTRAVENOUS | Status: AC
Start: 2021-10-01 — End: 2021-10-01
  Administered 2021-10-01: 500 mL via INTRAVENOUS

## 2021-10-01 MED ORDER — INSULIN ASPART 100 UNIT/ML IJ SOLN
0.0000 [IU] | Freq: Every day | INTRAMUSCULAR | Status: DC
  Administered 2021-10-01: 4 [IU] via SUBCUTANEOUS
  Administered 2021-10-02: 5 [IU] via SUBCUTANEOUS

## 2021-10-01 MED ORDER — INSULIN ASPART 100 UNIT/ML IJ SOLN
0.0000 [IU] | Freq: Three times a day (TID) | INTRAMUSCULAR | Status: DC
  Administered 2021-10-01 (×2): 3 [IU] via SUBCUTANEOUS
  Administered 2021-10-02: 15 [IU] via SUBCUTANEOUS
  Administered 2021-10-02: 5 [IU] via SUBCUTANEOUS

## 2021-10-01 MED ORDER — LEVOTHYROXINE SODIUM 75 MCG PO TABS
75.0000 ug | ORAL_TABLET | Freq: Every day | ORAL | Status: DC
  Administered 2021-10-01 – 2021-10-06 (×6): 75 ug via ORAL
  Filled 2021-10-01 (×6): qty 1

## 2021-10-01 MED ORDER — ORAL CARE MOUTH RINSE: 15.0000 mL | OROMUCOSAL | Status: DC | PRN

## 2021-10-01 MED ORDER — PANTOPRAZOLE SODIUM 40 MG PO TBEC
40.0000 mg | DELAYED_RELEASE_TABLET | Freq: Every day | ORAL | Status: DC
  Administered 2021-10-01 – 2021-10-06 (×6): 40 mg via ORAL
  Filled 2021-10-01 (×6): qty 1

## 2021-10-01 MED ORDER — HYDRALAZINE HCL 20 MG/ML IJ SOLN
5.0000 mg | INTRAMUSCULAR | Status: DC | PRN
  Administered 2021-10-04: 5 mg via INTRAVENOUS
  Filled 2021-10-01: qty 1

## 2021-10-01 MED ORDER — ENOXAPARIN SODIUM 40 MG/0.4ML IJ SOSY
40.0000 mg | PREFILLED_SYRINGE | INTRAMUSCULAR | Status: DC
  Administered 2021-10-01 – 2021-10-06 (×6): 40 mg via SUBCUTANEOUS
  Filled 2021-10-01 (×6): qty 0.4

## 2021-10-01 MED ORDER — ALBUTEROL SULFATE (2.5 MG/3ML) 0.083% IN NEBU
2.5000 mg | INHALATION_SOLUTION | Freq: Four times a day (QID) | RESPIRATORY_TRACT | Status: DC | PRN
  Administered 2021-10-02 – 2021-10-03 (×3): 2.5 mg via RESPIRATORY_TRACT
  Filled 2021-10-01 (×3): qty 3

## 2021-10-01 MED ORDER — DOCUSATE SODIUM 100 MG PO CAPS
100.0000 mg | ORAL_CAPSULE | Freq: Two times a day (BID) | ORAL | Status: DC
  Administered 2021-10-01 – 2021-10-05 (×10): 100 mg via ORAL
  Filled 2021-10-01 (×11): qty 1

## 2021-10-01 MED ORDER — IOHEXOL 350 MG/ML SOLN
50.0000 mL | Freq: Once | INTRAVENOUS | Status: AC | PRN
  Administered 2021-10-01: 50 mL via INTRAVENOUS

## 2021-10-01 MED ORDER — PREDNISONE 10 MG PO TABS
10.0000 mg | ORAL_TABLET | Freq: Every day | ORAL | Status: DC
  Administered 2021-10-02: 10 mg via ORAL
  Filled 2021-10-01: qty 1

## 2021-10-01 MED ORDER — SODIUM CHLORIDE 0.9 % IV SOLN
500.0000 mg | INTRAVENOUS | Status: DC
  Administered 2021-10-02: 500 mg via INTRAVENOUS
  Filled 2021-10-01: qty 5

## 2021-10-01 MED ORDER — HYDRALAZINE HCL 10 MG PO TABS
10.0000 mg | ORAL_TABLET | Freq: Three times a day (TID) | ORAL | Status: DC
  Administered 2021-10-01 – 2021-10-06 (×16): 10 mg via ORAL
  Filled 2021-10-01 (×16): qty 1

## 2021-10-01 MED ORDER — ASPIRIN 81 MG PO TBEC
81.0000 mg | DELAYED_RELEASE_TABLET | Freq: Every evening | ORAL | Status: DC
  Administered 2021-10-01 – 2021-10-05 (×5): 81 mg via ORAL
  Filled 2021-10-01 (×5): qty 1

## 2021-10-01 MED ORDER — VANCOMYCIN HCL 1500 MG/300ML IV SOLN: 1500.0000 mg | INTRAVENOUS | Status: DC

## 2021-10-01 MED ORDER — INSULIN ASPART PROT & ASPART (70-30 MIX) 100 UNIT/ML ~~LOC~~ SUSP
35.0000 [IU] | Freq: Three times a day (TID) | SUBCUTANEOUS | Status: DC
  Administered 2021-10-01 (×2): 35 [IU] via SUBCUTANEOUS
  Filled 2021-10-01 (×2): qty 10

## 2021-10-01 MED ORDER — GUAIFENESIN ER 600 MG PO TB12: 600.0000 mg | ORAL_TABLET | Freq: Two times a day (BID) | ORAL | Status: DC | PRN

## 2021-10-01 MED ORDER — ALBUTEROL SULFATE (2.5 MG/3ML) 0.083% IN NEBU
2.5000 mg | INHALATION_SOLUTION | RESPIRATORY_TRACT | Status: DC | PRN
Start: 2021-10-01 — End: 2021-10-01

## 2021-10-01 MED ORDER — ONDANSETRON HCL 4 MG PO TABS: 4.0000 mg | ORAL_TABLET | Freq: Four times a day (QID) | ORAL | Status: DC | PRN

## 2021-10-01 MED ORDER — FENTANYL CITRATE PF 50 MCG/ML IJ SOSY
50.0000 ug | PREFILLED_SYRINGE | Freq: Once | INTRAMUSCULAR | Status: AC
  Administered 2021-10-01: 50 ug via INTRAVENOUS
  Filled 2021-10-01: qty 1

## 2021-10-01 MED ORDER — VANCOMYCIN HCL 1500 MG/300ML IV SOLN
1500.0000 mg | Freq: Once | INTRAVENOUS | Status: AC
  Administered 2021-10-01: 1500 mg via INTRAVENOUS
  Filled 2021-10-01: qty 300

## 2021-10-01 MED ORDER — SODIUM CHLORIDE 0.9 % IV SOLN
2.0000 g | INTRAVENOUS | Status: AC
  Administered 2021-10-02 – 2021-10-05 (×4): 2 g via INTRAVENOUS
  Filled 2021-10-01 (×4): qty 20

## 2021-10-01 MED ORDER — ONDANSETRON HCL 4 MG/2ML IJ SOLN: 4.0000 mg | Freq: Four times a day (QID) | INTRAMUSCULAR | Status: DC | PRN

## 2021-10-01 MED ORDER — CLOPIDOGREL BISULFATE 75 MG PO TABS
75.0000 mg | ORAL_TABLET | Freq: Every day | ORAL | Status: DC
  Administered 2021-10-01 – 2021-10-06 (×6): 75 mg via ORAL
  Filled 2021-10-01 (×6): qty 1

## 2021-10-01 NOTE — Progress Notes (Signed)
Pharmacy Antibiotic Note  [name] is a 76 y.o. male admitted on 10/01/2021 with pneumonia.  Pharmacy has been consulted for Vancomycin dosing. WBC elevated. Mild bump in Scr.   Plan: -Vancomycin 1500 mg IV q24h >>>Estimated AUC: 512 -F/U additional gram negative coverage-Cefepime 2g IV x 1 in the ED -Trend WBC, temp, renal function  -F/U infectious work-up -Drug levels as indicated   Temp (24hrs), Avg:100.7 F (38.2 C), Min:100.7 F (38.2 C), Max:100.7 F (38.2 C)  Recent Labs  Lab 09/25/21 0410 09/25/21 0600 10/01/21 0455  WBC  --  21.3* 17.2*  CREATININE 1.12  --  1.34*  LATICACIDVEN  --   --  1.1    CrCl cannot be calculated (Unknown ideal weight.).    Allergies  Allergen Reactions   Statins Other (See Comments)    Pt says they make him "mean"   Hydrocortisone Nausea Only   Ranexa [Ranolazine] Other (See Comments)    Severe weakness/near syncope after 1 dose hallucinations   Azithromycin     Raises blood sugar   Hydrocodone Nausea And Vomiting   Miralax [Polyethylene Glycol] Nausea And Vomiting   Metolazone Other (See Comments)    Drained, no energy    Narda Bonds, PharmD, BCPS Clinical Pharmacist Phone: [phone]

## 2021-10-01 NOTE — ED Provider Notes (Signed)
  Physical Exam  BP (!) 134/57   Pulse 87   Temp (!) 100.7 F (38.2 C) (Oral)   Resp (!) 24   SpO2 97%   Physical Exam  Procedures  Procedures  ED Course / MDM    Medical Decision Making Amount and/or Complexity of Data Reviewed Labs: ordered. Radiology: ordered. ECG/medicine tests: ordered.  Risk Prescription drug management. Decision regarding hospitalization.   Patient with left lower lobe pneumonia, being treated for hospital-acquired pneumonia. Admitted to hospitalist Dr Norton Blizzard, Carola Rhine, MD 10/01/21 [phone]

## 2021-10-01 NOTE — ED Provider Notes (Signed)
[facility] EMERGENCY DEPARTMENT Provider Note   CSN: [id number] Arrival date & time: 10/01/21  0342     History  Chief Complaint  Patient presents with   Chest Pain    [name] is a 76 y.o. male.  The history is provided by the patient, the EMS personnel and medical records.  Chest Pain [name] is a 76 y.o. male who presents to the Emergency Department complaining of chest pain.  He presents to the emergency department by EMS for evaluation of chest pain.  He is status post L4-5 MIS laminectomy on July 21 here for evaluation of chest/epigastric discomfort that started this evening.  He states that he has not felt well for the last 2 days with diarrhea, severe nausea, cough.  This evening he developed epigastric and chest discomfort.  He does report associated lower extremity edema.  Unknown if he is having fevers at home.  He took aspirin prior to ED arrival.  He does report generalized weakness.  He states that his surgical site has soreness but no increase in pain or drainage.     Home Medications Prior to Admission medications   Medication Sig Start Date End Date Taking? Authorizing Provider  acetaminophen (TYLENOL) 500 MG tablet Take 500-1,000 mg by mouth as needed (pain).    [provider]  albuterol (PROVENTIL) (2.5 MG/3ML) 0.083% nebulizer solution Take 3 mLs (2.5 mg total) by nebulization every 6 (six) hours as needed for wheezing or shortness of breath. Dx Code D86.0 07/24/14   Elsie Stain, MD  Albuterol Sulfate (PROAIR RESPICLICK) 283 (90 BASE) MCG/ACT AEPB Inhale 2 puffs into the lungs every 6 (six) hours as needed. Patient taking differently: Inhale 2 puffs into the lungs as needed (for shortness of breath). 07/24/14   Elsie Stain, MD  aspirin EC 81 MG tablet Take 81 mg by mouth every evening.     [provider]  Cholecalciferol (VITAMIN D3) 3000 UNITS TABS Take 3,000 Units by mouth daily.    [provider]   clopidogrel (PLAVIX) 75 MG tablet Take 1 tablet (75 mg total) by mouth daily. Restart on 09/28/21 09/25/21   Heath Lark D, DO  Coenzyme Q10 (COQ-10) 400 MG CAPS Take 400 mg by mouth daily.    [provider]  DROPLET INSULIN SYRINGE 31G X 5/16" 1 ML [place]  08/14/18   [provider]  ezetimibe (ZETIA) 10 MG tablet Take 10 mg by mouth once a week. 06/25/21   [provider]  furosemide (LASIX) 80 MG tablet Take 1 tablet (80 mg total) by mouth daily as needed for edema. Patient taking differently: Take 80 mg by mouth daily. 09/16/21   Geradine Girt, DO  gabapentin (NEURONTIN) 100 MG capsule Take 1 capsule (100 mg total) by mouth 3 (three) times daily. 09/16/21   Geradine Girt, DO  hydrALAZINE (APRESOLINE) 10 MG tablet Take 1 tablet (10 mg total) by mouth every 8 (eight) hours. Patient taking differently: Take 10 mg by mouth 3 (three) times daily. 09/16/21   Geradine Girt, DO  insulin aspart protamine- aspart (NOVOLOG MIX 70/30) (70-30) 100 UNIT/ML injection Inject 0.4 mLs (40 Units total) into the skin with breakfast, with lunch, and with evening meal. Patient taking differently: Inject 35 Units into the skin with breakfast, with lunch, and with evening meal. 09/16/21   Geradine Girt, DO  levothyroxine (SYNTHROID) 75 MCG tablet Take 75 mcg by mouth daily. 02/24/21  [provider]  lidocaine (LIDODERM) 5 % Place 1 patch onto the skin daily as needed for pain. 10/21/20   [provider]  montelukast (SINGULAIR) 10 MG tablet Take 10 mg by mouth daily as needed (allergies).  07/18/18   [provider]  Multiple Vitamin (MULTIVITAMIN WITH MINERALS) TABS tablet Take 1 tablet by mouth daily. 10/08/18   Hongalgi, Lenis Dickinson, MD  nitroGLYCERIN (NITROSTAT) 0.4 MG SL tablet Place 1 tablet (0.4 mg total) under the tongue every 5 (five) minutes as needed for chest pain. 07/01/20 09/23/21  Imogene Burn, PA-C  OVER THE COUNTER MEDICATION Take 1 tablet by mouth  daily. [place] XL    [provider]  oxyCODONE (OXY IR/ROXICODONE) 5 MG immediate release tablet Take 1 tablet (5 mg total) by mouth every 4 (four) hours as needed for severe pain or breakthrough pain. 09/25/21   Nita Sells, MD  rosuvastatin (CRESTOR) 20 MG tablet Take 20 mg by mouth daily. Patient not taking: Reported on 09/23/2021    [provider]      Allergies    Statins, Hydrocortisone, Ranexa [ranolazine], Azithromycin, Hydrocodone, Miralax [polyethylene glycol], and Metolazone    Review of Systems   Review of Systems  Cardiovascular:  Positive for chest pain.  All other systems reviewed and are negative.   Physical Exam Updated Vital Signs BP (!) 134/57   Pulse 87   Temp (!) 100.7 F (38.2 C) (Oral)   Resp (!) 24   SpO2 97%  Physical Exam Vitals and nursing note reviewed.  Constitutional:      Appearance: He is well-developed. He is ill-appearing.  HENT:     Head: Normocephalic and atraumatic.  Cardiovascular:     Rate and Rhythm: Regular rhythm. Tachycardia present.     Heart sounds: No murmur heard. Pulmonary:     Effort: Pulmonary effort is normal. No respiratory distress.     Comments: Crackles in the left lung base Abdominal:     Palpations: Abdomen is soft.     Tenderness: There is no abdominal tenderness. There is no guarding or rebound.  Musculoskeletal:        General: No tenderness.     Comments: Nonpitting edema to bilateral lower extremities, left greater than right  Skin:    General: Skin is warm and dry.  Neurological:     Mental Status: He is alert and oriented to person, place, and time.  Psychiatric:        Behavior: Behavior normal.     ED Results / Procedures / Treatments   Labs (all labs ordered are listed, but only abnormal results are displayed) Labs Reviewed  COMPREHENSIVE METABOLIC PANEL - Abnormal; Notable for the following components:      Result Value   Sodium 134 (*)    Creatinine, Ser 1.34 (*)     Calcium 7.7 (*)    Total Protein 4.9 (*)    Albumin 2.3 (*)    GFR, Estimated 55 (*)    All other components within normal limits  CBC WITH DIFFERENTIAL/PLATELET - Abnormal; Notable for the following components:   WBC 17.2 (*)    RBC 3.79 (*)    Hemoglobin 9.8 (*)    HCT 33.7 (*)    MCH 25.9 (*)    MCHC 29.1 (*)    RDW 20.8 (*)    Neutro Abs 14.5 (*)    Monocytes Absolute 1.4 (*)    Abs Immature Granulocytes 0.23 (*)    All other components  within normal limits  BRAIN NATRIURETIC PEPTIDE - Abnormal; Notable for the following components:   B Natriuretic Peptide 141.2 (*)    All other components within normal limits  TROPONIN I (HIGH SENSITIVITY) - Abnormal; Notable for the following components:   Troponin I (High Sensitivity) 64 (*)    All other components within normal limits  CULTURE, BLOOD (ROUTINE X 2)  CULTURE, BLOOD (ROUTINE X 2)  URINE CULTURE  RESP PANEL BY RT-PCR (FLU A&B, COVID) ARPGX2  LACTIC ACID, PLASMA  PROTIME-INR  APTT  LIPASE, BLOOD  LACTIC ACID, PLASMA  URINALYSIS, ROUTINE W REFLEX MICROSCOPIC  TROPONIN I (HIGH SENSITIVITY)    EKG EKG Interpretation  Date/Time:  Friday October 01 2021 05:36:00 EDT Ventricular Rate:  96 PR Interval:  153 QRS Duration: 118 QT Interval:  369 QTC Calculation: 467 R Axis:   -64 Text Interpretation: Sinus or ectopic atrial rhythm Left anterior fascicular block Nonspecific T abnormalities, lateral leads Confirmed by Quintella Reichert [phone]) on 10/01/2021 5:55:38 AM  Radiology CT Angio Chest PE W/Cm &/Or Wo Cm  Result Date: 10/01/2021 CLINICAL DATA:  76 year old male with suspected pulmonary embolism. EXAM: CT ANGIOGRAPHY CHEST WITH CONTRAST TECHNIQUE: Multidetector CT imaging of the chest was performed using the standard protocol during bolus administration of intravenous contrast. Multiplanar CT image reconstructions and MIPs were obtained to evaluate the vascular anatomy. RADIATION DOSE REDUCTION: This exam was performed  according to the departmental dose-optimization program which includes automated exposure control, adjustment of the mA and/or kV according to patient size and/or use of iterative reconstruction technique. CONTRAST:  47m OMNIPAQUE IOHEXOL 350 MG/ML SOLN COMPARISON:  Chest CT 05/06/2019. FINDINGS: Cardiovascular: No filling defects within the pulmonary arterial tree to suggest pulmonary embolism. Heart size is normal. There is no significant pericardial fluid, thickening or pericardial calcification. There is aortic atherosclerosis, as well as atherosclerosis of the great vessels of the mediastinum and the coronary arteries, including calcified atherosclerotic plaque in the left anterior descending, left circumflex and right coronary arteries. Mediastinum/Nodes: No pathologically enlarged mediastinal or hilar lymph nodes. Esophagus is unremarkable in appearance. No axillary lymphadenopathy. Lungs/Pleura: Extensive bronchial wall thickening noted throughout the lungs bilaterally. There is also patchy thickening of the peribronchovascular interstitium and extensive peribronchovascular airspace consolidation most severe in the left lower lobe. Small left pleural effusion in the base of the left hemithorax. No right pleural effusion. Upper Abdomen: Aortic atherosclerosis. Status post cholecystectomy. Liver has a shrunken appearance and nodular contour, suggesting underlying cirrhosis. Musculoskeletal: There are no aggressive appearing lytic or blastic lesions noted in the visualized portions of the skeleton. Review of the MIP images confirms the above findings. IMPRESSION: 1. No evidence of pulmonary embolism. 2. Bronchitis with left lower lobe bronchopneumonia. 3. Small parapneumonic pleural effusion in the base of the left hemithorax. 4. Aortic atherosclerosis, in addition to three-vessel coronary artery disease. Please note that although the presence of coronary artery calcium documents the presence of coronary  artery disease, the severity of this disease and any potential stenosis cannot be assessed on this non-gated CT examination. Assessment for potential risk factor modification, dietary therapy or pharmacologic therapy may be warranted, if clinically indicated. Aortic Atherosclerosis (ICD10-I70.0). Electronically Signed   By: DVinnie LangtonM.D.   On: 10/01/2021 07:30   DG Chest Port 1 View  Result Date: 10/01/2021 CLINICAL DATA:  Chest pain and cough. EXAM: PORTABLE CHEST 1 VIEW COMPARISON:  August 28, 2019 FINDINGS: The heart size and mediastinal contours are within normal limits. Mild to moderate severity areas  of bibasilar atelectasis and/or infiltrate are noted. There is no evidence of a pleural effusion or pneumothorax. The visualized skeletal structures are unremarkable. IMPRESSION: Mild to moderate severity bibasilar atelectasis and/or infiltrate. Electronically Signed   By: Virgina Norfolk M.D.   On: 10/01/2021 04:27    Procedures Procedures   CRITICAL CARE Performed by: Quintella Reichert   Total critical care time: 40 minutes  Critical care time was exclusive of separately billable procedures and treating other patients.  Critical care was necessary to treat or prevent imminent or life-threatening deterioration.  Critical care was time spent personally by me on the following activities: development of treatment plan with patient and/or surrogate as well as nursing, discussions with consultants, evaluation of patient's response to treatment, examination of patient, obtaining history from patient or surrogate, ordering and performing treatments and interventions, ordering and review of laboratory studies, ordering and review of radiographic studies, pulse oximetry and re-evaluation of patient's condition.   Medications Ordered in ED Medications  vancomycin (VANCOREADY) IVPB 1500 mg/300 mL (1,500 mg Intravenous New Bag/Given 10/01/21 0552)  vancomycin (VANCOREADY) IVPB 1500 mg/300 mL (has  no administration in time range)  sodium chloride 0.9 % bolus 500 mL (0 mLs Intravenous Stopped 10/01/21 0535)  fentaNYL (SUBLIMAZE) injection 50 mcg (50 mcg Intravenous Given 10/01/21 0546)  ceFEPIme (MAXIPIME) 2 g in sodium chloride 0.9 % 100 mL IVPB (0 g Intravenous Stopped 10/01/21 0638)  fentaNYL (SUBLIMAZE) 50 MCG/ML injection (  Return to [facility] 10/01/21 0600)  iohexol (OMNIPAQUE) 350 MG/ML injection 50 mL (50 mLs Intravenous Contrast Given 10/01/21 0720)  hydrocortisone sodium succinate (SOLU-CORTEF) 100 MG injection 100 mg (100 mg Intravenous Given 10/01/21 4098)    ED Course/ Medical Decision Making/ A&P                           Medical Decision Making Amount and/or Complexity of Data Reviewed Labs: ordered. Radiology: ordered. ECG/medicine tests: ordered.  Risk Prescription drug management.   Patient with history of sarcoidosis, diabetes, adrenal insufficiency, CHF here for evaluation of chest pain, cough.  He had recent back surgery on July 21.  He has a new oxygen requirement of 2 L nasal cannula, satting in the mid to upper 80s without supplemental oxygen.  He is ill-appearing on evaluation, with crackles at the left lung base.  Chest x-ray with atelectasis versus infiltrate and he was started on broad-spectrum antibiotics for healthcare associated pneumonia.  Given his recent surgery, asymmetric lower extremity edema a CTA was obtained, which is negative for PE but is consistent with pneumonia.  CBC with leukocytosis, this is similar compared to his baseline.  BNP and troponin are minimally elevated.  On repeat evaluation patient does state that he stopped his prednisone after surgery due to concern of poor wound healing.  Given his history of adrenal insufficiency and prednisone dependence we will treat with one-time stress dose of hydrocortisone.  Patient and wife updated of findings of studies.  Plan to admit for ongoing treatment.  Medicine consulted for  admission.        Final Clinical Impression(s) / ED Diagnoses Final diagnoses:  Community acquired pneumonia of left lower lobe of lung    Rx / DC Orders ED Discharge Orders     None         Quintella Reichert, MD 10/01/21 [phone]

## 2021-10-01 NOTE — H&P (Signed)
History and Physical    Patient: [name] IWP:[id number] DOB: 1945-06-15 DOA: 10/01/2021 DOS: the patient was seen and examined on 10/01/2021 PCP: Shirline Frees, MD  Patient coming from: Home - lives with wife; NOK: Wife, Hodari Chuba, [phone]   Chief Complaint: chest pain, cough  HPI: DARRIS STAIGER is a 76 y.o. male with medical history significant of adrenal insufficiency; stage 3 CKD; CAD s/p stents; DM; sarcoidosis; OSA on CPAP; and hypothyroidism presenting with cough, chest pain.  He was admitted from 7/9-13 for intractable back pain from lumbar radiculopathy and treated unsuccessfully with steroids and so returned from 7/19-22 for lumbar fusion that was performed on 7/21 with improvement in symptoms.  He reports that he has been progressing well but he developed cough on 7/25.  Yesterday, he went to neurosurgery for f/u and his wife was concerned about PNA.  On the way home the patient became acutely ill and fatigued and remained that way overnight and so she called 911.    ER Course:  Bounceback with LLL PNA post-operatively.  Respiratory symptoms for a few days with CP.  Sepsis eval done, fever, tachycardia.  PE study negative other than PNA.  Chronic prednisone, stopped for wound healing reasons by surgery and given Solu-Cortef.  Also given Vanc, Cefepime.  Stable on 2L [place] O2.     Review of Systems: As mentioned in the history of present illness. All other systems reviewed and are negative. Past Medical History:  Diagnosis Date   Adrenal insufficiency ([place])    Allergy    Anemia    Arthritis    feet    Broken foot 09/2019   had to wore a boot. Left    CKD (chronic kidney disease), stage III (HCC)    Coronary artery disease    has stents   Coronary atherosclerosis of native coronary artery    Proximal LAD, posterior lateral stent widely patent-10/12/11   Diabetes mellitus    insulin and pills   Hearing loss    wears hearing aids   History of blood transfusion  06/30/2016   [facility] - 2 units transfused   Hypertension    OSA (obstructive sleep apnea)    uses VPAC sleep study 2 years done through [facility]. Dr. Marlou Porch arranged study   Sarcoidosis    Sleep apnea    uses CPAP nightly   Thyroid disease    Type 2 diabetes mellitus [facility])    Past Surgical History:  Procedure Laterality Date   APPENDECTOMY     CATARACT EXTRACTION  2011   bilat   CHOLECYSTECTOMY  01/25/2011   Procedure: LAPAROSCOPIC CHOLECYSTECTOMY WITH INTRAOPERATIVE CHOLANGIOGRAM;  Surgeon: Judieth Keens, DO;  Location: [place];  Service: General;  Laterality: N/A;   COLONOSCOPY     several   COLONOSCOPY WITH PROPOFOL N/A 01/03/2020   Procedure: COLONOSCOPY WITH PROPOFOL;  Surgeon: Gatha Mayer, MD;  [facility]: [facility];  Service: Endoscopy;  Laterality: N/A;   CORONARY ANGIOPLASTY     most recent 11/2009   CORONARY STENT INTERVENTION N/A 08/07/2018   Procedure: CORONARY STENT INTERVENTION;  Surgeon: Jettie Booze, MD;  Location: [place] CV LAB;  Service: Cardiovascular;  Laterality: N/A;   CORONARY STENT INTERVENTION N/A 01/18/2021   Procedure: CORONARY STENT INTERVENTION;  Surgeon: Sherren Mocha, MD;  Location: [place] CV LAB;  Service: Cardiovascular;  Laterality: N/A;   CORONARY STENT PLACEMENT  2009   in LAD and side branch PTCA   ESOPHAGOGASTRODUODENOSCOPY (EGD) WITH  PROPOFOL N/A 01/03/2020   Procedure: ESOPHAGOGASTRODUODENOSCOPY (EGD) WITH PROPOFOL;  Surgeon: Gatha Mayer, MD;  [facility];  Service: Endoscopy;  Laterality: N/A;   HEMOSTASIS CLIP PLACEMENT  01/03/2020   Procedure: HEMOSTASIS CLIP PLACEMENT;  Surgeon: Gatha Mayer, MD;  Location: [facility];  Service: Endoscopy;;   INTERCOSTAL NERVE BLOCK  2011, 06/2016   x2. lumbar spine   INTRAVASCULAR PRESSURE WIRE/FFR STUDY N/A 01/18/2021   Procedure: INTRAVASCULAR PRESSURE WIRE/FFR STUDY;  Surgeon: Sherren Mocha, MD;  Location: [place] CV LAB;  Service: Cardiovascular;   Laterality: N/A;   LEFT HEART CATHETERIZATION WITH CORONARY ANGIOGRAM Bilateral 10/12/2011   Procedure: LEFT HEART CATHETERIZATION WITH CORONARY ANGIOGRAM;  Surgeon: Candee Furbish, MD;  Location: [facility] CATH LAB;  Service: Cardiovascular;  Laterality: Bilateral;   LEFT HEART CATHETERIZATION WITH CORONARY ANGIOGRAM N/A 04/01/2014   Procedure: LEFT HEART CATHETERIZATION WITH CORONARY ANGIOGRAM;  Surgeon: Candee Furbish, MD;  Location: [facility] CATH LAB;  Service: Cardiovascular;  Laterality: N/A;   POLYPECTOMY  01/03/2020   Procedure: POLYPECTOMY;  Surgeon: Gatha Mayer, MD;  [facility];  Service: Endoscopy;;   RIGHT/LEFT HEART CATH AND CORONARY ANGIOGRAPHY N/A 08/07/2018   Procedure: RIGHT/LEFT HEART CATH AND CORONARY ANGIOGRAPHY;  Surgeon: Jettie Booze, MD;  Location: [place] CV LAB;  Service: Cardiovascular;  Laterality: N/A;   RIGHT/LEFT HEART CATH AND CORONARY ANGIOGRAPHY N/A 12/25/2020   Procedure: RIGHT/LEFT HEART CATH AND CORONARY ANGIOGRAPHY;  Surgeon: Belva Crome, MD;  Location: [place] CV LAB;  Service: Cardiovascular;  Laterality: N/A;   TRANSFORAMINAL LUMBAR INTERBODY FUSION W/ MIS 1 LEVEL N/A 09/24/2021   Procedure: LUMBAR FOUR-FIVE MINIMALLY INVASIVE (MIS) TRANSFORAMINAL LUMBAR INTERBODY FUSION (TLIF) WITH METRX;  Surgeon: Judith Part, MD;  Location: [place];  Service: Neurosurgery;  Laterality: N/A;   Social History:  reports that he has never smoked. He has never used smokeless tobacco. He reports that he does not drink alcohol and does not use drugs.  Allergies  Allergen Reactions   Statins Other (See Comments)    Pt says they make him "mean"   Hydrocortisone Nausea Only   Ranexa [Ranolazine] Other (See Comments)    Severe weakness/near syncope after 1 dose Hallucinations    Hydrocodone Nausea And Vomiting   Miralax [Polyethylene Glycol] Nausea And Vomiting   Z-Pak [Azithromycin] Other (See Comments)    Raises blood sugar   Zaroxolyn [Metolazone] Other  (See Comments)    Drained, no energy    Family History  Problem Relation Age of Onset   Kidney disease Mother    Diabetes Mother    Kidney cancer Mother    Heart attack Father    Asthma Sister    Anesthesia problems Sister        "Kidney's did not wake up"   Sarcoidosis Sister    Sarcoidosis Niece    Colon cancer Neg Hx    Colon polyps Neg Hx    Rectal cancer Neg Hx    Stomach cancer Neg Hx     Prior to Admission medications   Medication Sig Start Date End Date Taking? Authorizing Provider  acetaminophen (TYLENOL) 500 MG tablet Take 500-1,000 mg by mouth as needed (pain).    [provider]  albuterol (PROVENTIL) (2.5 MG/3ML) 0.083% nebulizer solution Take 3 mLs (2.5 mg total) by nebulization every 6 (six) hours as needed for wheezing or shortness of breath. Dx Code D86.0 07/24/14   Elsie Stain, MD  Albuterol Sulfate (PROAIR RESPICLICK) 409 (90 BASE) MCG/ACT  AEPB Inhale 2 puffs into the lungs every 6 (six) hours as needed. Patient taking differently: Inhale 2 puffs into the lungs as needed (for shortness of breath). 07/24/14   Elsie Stain, MD  aspirin EC 81 MG tablet Take 81 mg by mouth every evening.     [provider]  Cholecalciferol (VITAMIN D3) 3000 UNITS TABS Take 3,000 Units by mouth daily.    [provider]  clopidogrel (PLAVIX) 75 MG tablet Take 1 tablet (75 mg total) by mouth daily. Restart on 09/28/21 09/25/21   Heath Lark D, DO  Coenzyme Q10 (COQ-10) 400 MG CAPS Take 400 mg by mouth daily.    [provider]  DROPLET INSULIN SYRINGE 31G X 5/16" 1 ML [place]  08/14/18   [provider]  ezetimibe (ZETIA) 10 MG tablet Take 10 mg by mouth once a week. 06/25/21   [provider]  furosemide (LASIX) 80 MG tablet Take 1 tablet (80 mg total) by mouth daily as needed for edema. Patient taking differently: Take 80 mg by mouth daily. 09/16/21   Geradine Girt, DO  gabapentin (NEURONTIN) 100 MG capsule Take 1 capsule (100  mg total) by mouth 3 (three) times daily. 09/16/21   Geradine Girt, DO  hydrALAZINE (APRESOLINE) 10 MG tablet Take 1 tablet (10 mg total) by mouth every 8 (eight) hours. Patient taking differently: Take 10 mg by mouth 3 (three) times daily. 09/16/21   Geradine Girt, DO  insulin aspart protamine- aspart (NOVOLOG MIX 70/30) (70-30) 100 UNIT/ML injection Inject 0.4 mLs (40 Units total) into the skin with breakfast, with lunch, and with evening meal. Patient taking differently: Inject 35 Units into the skin with breakfast, with lunch, and with evening meal. 09/16/21   Geradine Girt, DO  levothyroxine (SYNTHROID) 75 MCG tablet Take 75 mcg by mouth daily. 02/24/21   [provider]  lidocaine (LIDODERM) 5 % Place 1 patch onto the skin daily as needed for pain. 10/21/20   [provider]  montelukast (SINGULAIR) 10 MG tablet Take 10 mg by mouth daily as needed (allergies).  07/18/18   [provider]  Multiple Vitamin (MULTIVITAMIN WITH MINERALS) TABS tablet Take 1 tablet by mouth daily. 10/08/18   Hongalgi, Lenis Dickinson, MD  nitroGLYCERIN (NITROSTAT) 0.4 MG SL tablet Place 1 tablet (0.4 mg total) under the tongue every 5 (five) minutes as needed for chest pain. 07/01/20 09/23/21  Imogene Burn, PA-C  OVER THE COUNTER MEDICATION Take 1 tablet by mouth daily. [place] XL    [provider]  oxyCODONE (OXY IR/ROXICODONE) 5 MG immediate release tablet Take 1 tablet (5 mg total) by mouth every 4 (four) hours as needed for severe pain or breakthrough pain. 09/25/21   Nita Sells, MD  rosuvastatin (CRESTOR) 20 MG tablet Take 20 mg by mouth daily. Patient not taking: Reported on 09/23/2021    [provider]    Physical Exam: Vitals:   10/01/21 1245 10/01/21 1441 10/01/21 1458 10/01/21 1748  BP:  (!) 127/58  (!) 148/64  Pulse:  80    Resp:      Temp: 99.4 F (37.4 C) 98.1 F (36.7 C)    TempSrc: Oral Oral    SpO2:  97%    Weight:   96.9 kg   Height:   '5\' 10"'$   (1.778 m)    General:  Appears calm and comfortable and is in NAD, on [place] O2 Eyes:   EOMI, normal lids, iris ENT:  grossly normal hearing, lips & tongue, mmm Neck:  no LAD, masses or thyromegaly Cardiovascular:  RRR, no m/r/g. 1-2+ LE edema.  Respiratory:   Diminished breath sounds and scattered rhonchi in LLL.  Mildly increased respiratory effort. Abdomen:  soft, NT, ND Skin:  no rash or induration seen on limited exam Musculoskeletal:  grossly normal tone BUE/BLE, good ROM, no bony abnormality Psychiatric:  grossly normal mood and affect, speech fluent and appropriate, AOx3 Neurologic:  CN 2-12 grossly intact, moves all extremities in coordinated fashion   Radiological Exams on Admission: Independently reviewed - see discussion in A/P where applicable  VAS [place] LOWER EXTREMITY VENOUS (DVT) (ONLY [facility] & [facility])  Result Date: 10/01/2021  Lower Venous DVT Study Patient Name:  CONN TROMBETTA  Date of Exam:   10/01/2021 Medical Rec #: [id number]   Accession #:    [id number] Date of Birth: 10-08-1945   Patient Gender: M Patient Age:   89 years Exam Location:  [facility] Procedure:      VAS [place] LOWER EXTREMITY VENOUS (DVT) Referring Phys: Benjamine Mola REES --------------------------------------------------------------------------------  Indications: Edema.  Comparison Study: No previous exam noted. Performing Technologist: Bobetta Lime BS, RVT  Examination Guidelines: A complete evaluation includes B-mode imaging, spectral Doppler, color Doppler, and power Doppler as needed of all accessible portions of each vessel. Bilateral testing is considered an integral part of a complete examination. Limited examinations for reoccurring indications may be performed as noted. The reflux portion of the exam is performed with the patient in reverse Trendelenburg.  +-----+---------------+---------+-----------+----------+--------------+ RIGHTCompressibilityPhasicitySpontaneityPropertiesThrombus Aging  +-----+---------------+---------+-----------+----------+--------------+ CFV  Full           Yes      Yes                                 +-----+---------------+---------+-----------+----------+--------------+   +---------+---------------+---------+-----------+----------+--------------+ LEFT     CompressibilityPhasicitySpontaneityPropertiesThrombus Aging +---------+---------------+---------+-----------+----------+--------------+ CFV      Full           Yes      Yes                                 +---------+---------------+---------+-----------+----------+--------------+ SFJ      Full                                                        +---------+---------------+---------+-----------+----------+--------------+ FV Prox  Full                                                        +---------+---------------+---------+-----------+----------+--------------+ FV Mid   Full                                                        +---------+---------------+---------+-----------+----------+--------------+ FV DistalFull                                                        +---------+---------------+---------+-----------+----------+--------------+  PFV      Full                                                        +---------+---------------+---------+-----------+----------+--------------+ POP      Full           Yes      Yes                                 +---------+---------------+---------+-----------+----------+--------------+ PTV      Full                                                        +---------+---------------+---------+-----------+----------+--------------+ PERO     Full                                                        +---------+---------------+---------+-----------+----------+--------------+     Summary: RIGHT: - No evidence of common femoral vein obstruction.  LEFT: - No evidence of deep vein thrombosis in the lower  extremity. No indirect evidence of obstruction proximal to the inguinal ligament. - No cystic structure found in the popliteal fossa.  *See table(s) above for measurements and observations. Electronically signed by Jamelle Haring on 10/01/2021 at 2:09:10 PM.    Final    CT Angio Chest PE W/Cm &/Or Wo Cm  Result Date: 10/01/2021 CLINICAL DATA:  76 year old male with suspected pulmonary embolism. EXAM: CT ANGIOGRAPHY CHEST WITH CONTRAST TECHNIQUE: Multidetector CT imaging of the chest was performed using the standard protocol during bolus administration of intravenous contrast. Multiplanar CT image reconstructions and MIPs were obtained to evaluate the vascular anatomy. RADIATION DOSE REDUCTION: This exam was performed according to the departmental dose-optimization program which includes automated exposure control, adjustment of the mA and/or kV according to patient size and/or use of iterative reconstruction technique. CONTRAST:  92m OMNIPAQUE IOHEXOL 350 MG/ML SOLN COMPARISON:  Chest CT 05/06/2019. FINDINGS: Cardiovascular: No filling defects within the pulmonary arterial tree to suggest pulmonary embolism. Heart size is normal. There is no significant pericardial fluid, thickening or pericardial calcification. There is aortic atherosclerosis, as well as atherosclerosis of the great vessels of the mediastinum and the coronary arteries, including calcified atherosclerotic plaque in the left anterior descending, left circumflex and right coronary arteries. Mediastinum/Nodes: No pathologically enlarged mediastinal or hilar lymph nodes. Esophagus is unremarkable in appearance. No axillary lymphadenopathy. Lungs/Pleura: Extensive bronchial wall thickening noted throughout the lungs bilaterally. There is also patchy thickening of the peribronchovascular interstitium and extensive peribronchovascular airspace consolidation most severe in the left lower lobe. Small left pleural effusion in the base of the left  hemithorax. No right pleural effusion. Upper Abdomen: Aortic atherosclerosis. Status post cholecystectomy. Liver has a shrunken appearance and nodular contour, suggesting underlying cirrhosis. Musculoskeletal: There are no aggressive appearing lytic or blastic lesions noted in the visualized portions of the skeleton. Review of the MIP images confirms the above findings. IMPRESSION: 1. No evidence of pulmonary  embolism. 2. Bronchitis with left lower lobe bronchopneumonia. 3. Small parapneumonic pleural effusion in the base of the left hemithorax. 4. Aortic atherosclerosis, in addition to three-vessel coronary artery disease. Please note that although the presence of coronary artery calcium documents the presence of coronary artery disease, the severity of this disease and any potential stenosis cannot be assessed on this non-gated CT examination. Assessment for potential risk factor modification, dietary therapy or pharmacologic therapy may be warranted, if clinically indicated. Aortic Atherosclerosis (ICD10-I70.0). Electronically Signed   By: Vinnie Langton M.D.   On: 10/01/2021 07:30   DG Chest Port 1 View  Result Date: 10/01/2021 CLINICAL DATA:  Chest pain and cough. EXAM: PORTABLE CHEST 1 VIEW COMPARISON:  August 28, 2019 FINDINGS: The heart size and mediastinal contours are within normal limits. Mild to moderate severity areas of bibasilar atelectasis and/or infiltrate are noted. There is no evidence of a pleural effusion or pneumothorax. The visualized skeletal structures are unremarkable. IMPRESSION: Mild to moderate severity bibasilar atelectasis and/or infiltrate. Electronically Signed   By: Virgina Norfolk M.D.   On: 10/01/2021 04:27    EKG: Independently reviewed.   41 - NSR with rate 101; LAFB; no evidence of acute ischemia 0536 - NSR with rate 96; LAFB; no evidence of acute ischemia   Labs on Admission: I have personally reviewed the available labs and imaging studies at the time of the  admission.  Pertinent labs:    BUN 12/Creatinine 1.34/GFR 55 - stable Albumin 2.3 BNP 141.2 HS troponin 64 Lactate 1.1, 0.8 WBC 17.2 Hgb 9.8 INR 1.1 UAL small Hgb, 5 ketones, rare bacteria   Assessment and Plan: Principal Problem:   CAP (community acquired pneumonia) Active Problems:   Sarcoidosis   OSA (obstructive sleep apnea)   Coronary atherosclerosis of native coronary artery   Uncontrolled type 2 diabetes mellitus with hyperglycemia, with long-term current use of insulin (HCC)   Hyperlipidemia   Stage 3a chronic kidney disease (CKD) (HCC)   Hypothyroidism   Herniated lumbar intervertebral disc    CAP -Patient presenting with productive cough, subjective fever, mildly decreased oxygen saturation, and infiltrate in left lower lobe on chest x-ray -He was recently possibly exposed to adenovirus; suspect bacterial superinfection -CURB-65 score is 0 - it is reasonable to discharge the patient to home. -CURB-65 score is 1 - will admit the patient to telemetry -Pneumonia Severity Index (PSI) is Class 4, 9% mortality. -Will treat with Rocephin plus Azithromycin  -Additional complicating factors include: recent hospitalization; associated pleural effusion; and immunocompromise/adrenal insufficiency. -NS @ 75cc/hr -Fever control -Repeat CBC in am -Will add albuterol PRN -Will add Mucinex for cough  Lumbar radiculopathy -Patient with recent admission for the severe persistent LBP with radiculopathy despite steroids, now s/p fusion -He is doing very well from this standpoint -Will order PT/OT consults   DM -Last A1c was 11.9, indicating very poor control -Continue 70/30 -Cover with moderate-scale SSI    HTN -Continue hydralazine   CAD -DES x 3 placed in 01/2021 -Continue ASA, Plavix  -Of note, he has completed at least 6 months of DAPT   Sarcoidosis -Continue albuterol, Singulair -10 mg prednisone daily was held after surgery but supposed to be resumed; he was  given a dose of Solucortef in the ER and will resume prednisone now   HLD -Continue Zetia -Not actively taking Crestor since statins "make him mean"    Hypothyroidism -Continue Synthroid    OSA on CPAP -Continue CPAP  Stage 3a CKD -Appears to be stable  at this time   Advance Care Planning:   Code Status: Full Code   Consults: PT/OT  DVT Prophylaxis: Lovenox  Family Communication: Wife was present throughout evaluation  Severity of Illness: The appropriate patient status for this patient is INPATIENT. Inpatient status is judged to be reasonable and necessary in order to provide the required intensity of service to ensure the patient's safety. The patient's presenting symptoms, physical exam findings, and initial radiographic and laboratory data in the context of their chronic comorbidities is felt to place them at high risk for further clinical deterioration. Furthermore, it is not anticipated that the patient will be medically stable for discharge from the hospital within 2 midnights of admission.   * I certify that at the point of admission it is my clinical judgment that the patient will require inpatient hospital care spanning beyond 2 midnights from the point of admission due to high intensity of service, high risk for further deterioration and high frequency of surveillance required.*  Author: Karmen Bongo, MD 10/01/2021 7:48 PM  For on call review [email or web address].

## 2021-10-01 NOTE — ED Triage Notes (Signed)
Pt arrived with EMS for centralized, nonradiating CP. Diminished lower lung sounds. +Cough. Recent back surgery on 7/21 for disc herniation. Room air sat 89-90%. Given '4mg'$  zofran and 511m NS enroute. Warm to touch, EMS reported 100.4 temp.

## 2021-10-01 NOTE — Progress Notes (Signed)
Left LE venous duplex study completed. Please see CV Proc for preliminary results.  Arrionna Serena BS, RVT 10/01/2021 9:06 AM

## 2021-10-01 NOTE — Progress Notes (Signed)
Elink following code sepsis °

## 2021-10-01 NOTE — ED Notes (Signed)
ED TO INPATIENT HANDOFF REPORT  ED Nurse Name and Phone #: Baxter Flattery, RN  S Name/Age/Gender [name] 76 y.o. male Room/Bed: 040C/040C  Code Status   Code Status: Full Code  Home/SNF/Other Home Patient oriented to: self, place, time, and situation Is this baseline? Yes   Triage Complete: Triage complete  Chief Complaint CAP (community acquired pneumonia) [J18.9]  Triage Note Pt arrived with EMS for centralized, nonradiating CP. Diminished lower lung sounds. +Cough. Recent back surgery on 7/21 for disc herniation. Room air sat 89-90%. Given '4mg'$  zofran and 528m NS enroute. Warm to touch, EMS reported 100.4 temp.    Allergies Allergies  Allergen Reactions   Statins Other (See Comments)    Pt says they make him "mean"   Hydrocortisone Nausea Only   Ranexa [Ranolazine] Other (See Comments)    Severe weakness/near syncope after 1 dose Hallucinations    Hydrocodone Nausea And Vomiting   Miralax [Polyethylene Glycol] Nausea And Vomiting   Z-Pak [Azithromycin] Other (See Comments)    Raises blood sugar   Zaroxolyn [Metolazone] Other (See Comments)    Drained, no energy    Level of Care/Admitting Diagnosis ED Disposition     ED Disposition  Admit   Condition  --   [id number]  Level of Care: Telemetry Medical [104]  [facility] level of care is available:: Yes  Covid Evaluation: Asymptomatic - no recent exposure (last 10 days) testing not required  Diagnosis: CAP (community acquired pneumonia)) [[id number]] Admitting Physician: YKarmen Bongo[[id number]]  Attending Physician: YKarmen Bongo[[[id number]] Certification:: I certify this patient will need inpatient services for at least 2 midnights  Estimated Length of Stay: 2          B Medical/Surgery History Past Medical History:  Diagnosis Date   Adrenal insufficiency ([place]    Allergy    Anemia    Arthritis    feet     Broken foot 09/2019   had to wore a boot. Left    CKD (chronic kidney disease), stage III (HCC)    Coronary artery disease    has stents   Coronary atherosclerosis of native coronary artery    Proximal LAD, posterior lateral stent widely patent-10/12/11   Diabetes mellitus    insulin and pills   Hearing loss    wears hearing aids   History of blood transfusion 06/30/2016   [facility]- 2 units transfused   Hypertension    OSA (obstructive sleep apnea)    uses VPAC sleep study 2 years done through [facility]. SMarlou Porcharranged study   Sarcoidosis    Sleep apnea    uses CPAP nightly   Thyroid disease    Type 2 diabetes mellitus ([facility]    Past Surgical History:  Procedure Laterality Date   APPENDECTOMY     CATARACT EXTRACTION  2011   bilat   CHOLECYSTECTOMY  01/25/2011   Procedure: LAPAROSCOPIC CHOLECYSTECTOMY WITH INTRAOPERATIVE CHOLANGIOGRAM;  Surgeon: BJudieth Keens DO;  [facility]  Service: General;  Laterality: N/A;   COLONOSCOPY     several   COLONOSCOPY WITH PROPOFOL N/A 01/03/2020   Procedure: COLONOSCOPY WITH PROPOFOL;  Surgeon: GGatha Mayer MD;  [facility];  Service: Endoscopy;  Laterality: N/A;   CORONARY ANGIOPLASTY     most recent 11/2009   CORONARY STENT INTERVENTION N/A 08/07/2018   Procedure: CORONARY STENT INTERVENTION;  Surgeon: VIrish Lack  Charlann Lange, MD;  Location: [place] CV LAB;  Service: Cardiovascular;  Laterality: N/A;   CORONARY STENT INTERVENTION N/A 01/18/2021   Procedure: CORONARY STENT INTERVENTION;  Surgeon: Sherren Mocha, MD;  Location: [place] [facility] LAB;  Service: Cardiovascular;  Laterality: N/A;   CORONARY STENT PLACEMENT  2009   in LAD and side branch PTCA   ESOPHAGOGASTRODUODENOSCOPY (EGD) WITH PROPOFOL N/A 01/03/2020   Procedure: ESOPHAGOGASTRODUODENOSCOPY (EGD) WITH PROPOFOL;  Surgeon: Gatha Mayer, MD;  [facility];  Service: Endoscopy;  Laterality: N/A;   HEMOSTASIS CLIP PLACEMENT  01/03/2020    Procedure: HEMOSTASIS CLIP PLACEMENT;  Surgeon: Gatha Mayer, MD;  [facility];  Service: Endoscopy;;   INTERCOSTAL NERVE BLOCK  2011, 06/2016   x2. lumbar spine   INTRAVASCULAR PRESSURE WIRE/FFR STUDY N/A 01/18/2021   Procedure: INTRAVASCULAR PRESSURE WIRE/FFR STUDY;  Surgeon: Sherren Mocha, MD;  Location: [place] CV LAB;  Service: Cardiovascular;  Laterality: N/A;   LEFT HEART CATHETERIZATION WITH CORONARY ANGIOGRAM Bilateral 10/12/2011   Procedure: LEFT HEART CATHETERIZATION WITH CORONARY ANGIOGRAM;  Surgeon: Candee Furbish, MD;  Location: [facility] CATH LAB;  Service: Cardiovascular;  Laterality: Bilateral;   LEFT HEART CATHETERIZATION WITH CORONARY ANGIOGRAM N/A 04/01/2014   Procedure: LEFT HEART CATHETERIZATION WITH CORONARY ANGIOGRAM;  Surgeon: Candee Furbish, MD;  Location: [facility] CATH LAB;  Service: Cardiovascular;  Laterality: N/A;   POLYPECTOMY  01/03/2020   Procedure: POLYPECTOMY;  Surgeon: Gatha Mayer, MD;  [facility];  Service: Endoscopy;;   RIGHT/LEFT HEART CATH AND CORONARY ANGIOGRAPHY N/A 08/07/2018   Procedure: RIGHT/LEFT HEART CATH AND CORONARY ANGIOGRAPHY;  Surgeon: Jettie Booze, MD;  Location: [place] CV LAB;  Service: Cardiovascular;  Laterality: N/A;   RIGHT/LEFT HEART CATH AND CORONARY ANGIOGRAPHY N/A 12/25/2020   Procedure: RIGHT/LEFT HEART CATH AND CORONARY ANGIOGRAPHY;  Surgeon: Belva Crome, MD;  Location: [place] CV LAB;  Service: Cardiovascular;  Laterality: N/A;   TRANSFORAMINAL LUMBAR INTERBODY FUSION W/ MIS 1 LEVEL N/A 09/24/2021   Procedure: LUMBAR FOUR-FIVE MINIMALLY INVASIVE (MIS) TRANSFORAMINAL LUMBAR INTERBODY FUSION (TLIF) WITH METRX;  Surgeon: Judith Part, MD;  Location: [place];  Service: Neurosurgery;  Laterality: N/A;     A IV Location/Drains/Wounds Patient Lines/Drains/Airways Status     Active Line/Drains/Airways     Name Placement date Placement time Site Days   Peripheral IV 10/01/21 20 G Left Antecubital  10/01/21  0343  Antecubital  less than 1   Closed/Suction Drain  Right;Lateral Abdomen Bulb (JP) 19 Fr. 01/25/11  1016  Abdomen  3902   Incision 01/25/11 Abdomen Other (Comment) 01/25/11  1000  -- 3902   Incision (Closed) 09/24/21 Back 09/24/21  2126  -- 7   Incision - 5 Ports Abdomen Mid Umbilicus Right;Lateral Left;Lateral Right;Medial 01/25/11  2202  -- 3902   Wound / Incision (Open or Dehisced) 01/02/20 Non-pressure wound Foot Left 01/02/20  --  Foot  638            Intake/Output Last 24 hours  Intake/Output Summary (Last 24 hours) at 10/01/2021 1224 Last data filed at 10/01/2021 0343 Gross per 24 hour  Intake 500 ml  Output --  Net 500 ml    Labs/Imaging Results for orders placed or performed during the hospital encounter of 10/01/21 (from the past 48 hour(s))  Blood Culture (routine x 2)     Status: None (Preliminary result)   Collection Time: 10/01/21  3:58 AM   Specimen: BLOOD  Result Value Ref Range   Specimen Description  BLOOD LEFT    Special Requests      BOTTLES DRAWN AEROBIC AND ANAEROBIC Blood Culture adequate volume   Culture      NO GROWTH < 12 HOURS Performed at [facility], [place] [address], [place], [place] [address]    Report Status PENDING   Blood Culture (routine x 2)     Status: None (Preliminary result)   Collection Time: 10/01/21  4:15 AM   Specimen: BLOOD  Result Value Ref Range   Specimen Description BLOOD SITE NOT SPECIFIED    Special Requests      BOTTLES DRAWN AEROBIC ONLY Blood Culture results may not be optimal due to an inadequate volume of blood received in culture bottles   Culture      NO GROWTH < 12 HOURS Performed at [facility], [place] [address], [place], [place] [address]    Report Status PENDING   Lactic acid, plasma     Status: None   Collection Time: 10/01/21  4:55 AM  Result Value Ref Range   Lactic Acid, Venous 1.1 0.5 - 1.9 mmol/L    Comment: Performed at [place] [facility] [address], [place], [place]  [address]  Comprehensive metabolic panel     Status: Abnormal   Collection Time: 10/01/21  4:55 AM  Result Value Ref Range   Sodium 134 (L) 135 - 145 mmol/L   Potassium 4.0 3.5 - 5.1 mmol/L   Chloride 103 98 - 111 mmol/L   CO2 25 22 - 32 mmol/L   Glucose, Bld 82 70 - 99 mg/dL    Comment: Glucose reference range applies only to samples taken after fasting for at least 8 hours.   BUN 12 8 - 23 mg/dL   Creatinine, Ser 1.34 (H) 0.61 - 1.24 mg/dL   Calcium 7.7 (L) 8.9 - 10.3 mg/dL   Total Protein 4.9 (L) 6.5 - 8.1 g/dL   Albumin 2.3 (L) 3.5 - 5.0 g/dL   AST 21 15 - 41 U/L   ALT 19 0 - 44 U/L   Alkaline Phosphatase 49 38 - 126 U/L   Total Bilirubin 0.8 0.3 - 1.2 mg/dL   GFR, Estimated 55 (L) >60 mL/min    Comment: (NOTE) Calculated using the CKD-EPI Creatinine Equation (2021)    Anion gap 6 5 - 15    Comment: Performed at [facility], [place] [address] [place], [place] [address]  CBC with Differential     Status: Abnormal   Collection Time: 10/01/21  4:55 AM  Result Value Ref Range   WBC 17.2 (H) 4.0 - 10.5 K/uL   RBC 3.79 (L) 4.22 - 5.81 MIL/uL   Hemoglobin 9.8 (L) 13.0 - 17.0 g/dL   HCT 33.7 (L) 39.0 - 52.0 %   MCV 88.9 80.0 - 100.0 fL   MCH 25.9 (L) 26.0 - 34.0 pg   MCHC 29.1 (L) 30.0 - 36.0 g/dL   RDW 20.8 (H) 11.5 - 15.5 %   Platelets 250 150 - 400 K/uL   nRBC 0.0 0.0 - 0.2 %   Neutrophils Relative % 86 %   Neutro Abs 14.5 (H) 1.7 - 7.7 K/uL   Lymphocytes Relative 5 %   Lymphs Abs 0.9 0.7 - 4.0 K/uL   Monocytes Relative 8 %   Monocytes Absolute 1.4 (H) 0.1 - 1.0 K/uL   Eosinophils Relative 0 %   Eosinophils Absolute 0.1 0.0 - 0.5 K/uL   Basophils Relative 0 %  Basophils Absolute 0.1 0.0 - 0.1 K/uL   Immature Granulocytes 1 %   Abs Immature Granulocytes 0.23 (H) 0.00 - 0.07 K/uL    Comment: Performed at [facility], [place] [address] [place], [place] [address]  Protime-INR     Status: None   Collection Time: 10/01/21  4:55 AM  Result Value Ref Range    Prothrombin Time 14.4 11.4 - 15.2 seconds   INR 1.1 0.8 - 1.2    Comment: (NOTE) INR goal varies based on device and disease states. Performed at [facility], [place] [address], [place], [place] [address]   APTT     Status: None   Collection Time: 10/01/21  4:55 AM  Result Value Ref Range   aPTT 30 24 - 36 seconds    Comment: Performed at [facility] [address] [place], [place] [address]  Brain natriuretic peptide     Status: Abnormal   Collection Time: 10/01/21  4:55 AM  Result Value Ref Range   B Natriuretic Peptide 141.2 (H) 0.0 - 100.0 pg/mL    Comment: Performed at [facility] [address], [place], [place] [address]  Troponin I (High Sensitivity)     Status: Abnormal   Collection Time: 10/01/21  4:55 AM  Result Value Ref Range   Troponin I (High Sensitivity) 64 (H) <18 ng/L    Comment: (NOTE) Elevated high sensitivity troponin I (hsTnI) values and significant  changes across serial measurements may suggest ACS but many other  chronic and acute conditions are known to elevate hsTnI results.  Refer to the "Links" section for chest pain algorithms and additional  guidance. Performed at [facility], [place] [address], [place], [place] [address]   Lipase, blood     Status: None   Collection Time: 10/01/21  4:55 AM  Result Value Ref Range   Lipase 20 11 - 51 U/L    Comment: Performed at [facility] [address], [place], [place] [address]  Lactic acid, plasma     Status: None   Collection Time: 10/01/21  6:50 AM  Result Value Ref Range   Lactic Acid, Venous 0.8 0.5 - 1.9 mmol/L    Comment: Performed at [place] [facility] [address] [place], [place] [address]  Urinalysis, Routine w reflex microscopic Urine, Clean Catch     Status: Abnormal   Collection Time: 10/01/21  6:50 AM  Result Value Ref Range   Color, Urine YELLOW YELLOW   APPearance CLEAR CLEAR   Specific Gravity, Urine 1.011 1.005 - 1.030   pH 5.0 5.0 - 8.0    Glucose, UA NEGATIVE NEGATIVE mg/dL   Hgb urine dipstick SMALL (A) NEGATIVE   Bilirubin Urine NEGATIVE NEGATIVE   Ketones, ur 5 (A) NEGATIVE mg/dL   Protein, ur NEGATIVE NEGATIVE mg/dL   Nitrite NEGATIVE NEGATIVE   Leukocytes,Ua NEGATIVE NEGATIVE   RBC / HPF 0-5 0 - 5 RBC/hpf   WBC, UA 0-5 0 - 5 WBC/hpf   Bacteria, UA RARE (A) NONE SEEN   Mucus PRESENT    Hyaline Casts, UA PRESENT     Comment: Performed at [facility], [address] [place], [place] [address]  Troponin I (High Sensitivity)     Status: Abnormal   Collection Time: 10/01/21  6:50 AM  Result Value Ref Range   Troponin I (High Sensitivity) 40 (H) <18 ng/L    Comment: (NOTE) Elevated high sensitivity troponin  I (hsTnI) values and significant  changes across serial measurements may suggest ACS but many other  chronic and acute conditions are known to elevate hsTnI results.  Refer to the "Links" section for chest pain algorithms and additional  guidance. Performed at [facility], [place] [address], [place], [place] [address]   CBG monitoring, ED     Status: Abnormal   Collection Time: 10/01/21 11:14 AM  Result Value Ref Range   Glucose-Capillary 157 (H) 70 - 99 mg/dL    Comment: Glucose reference range applies only to samples taken after fasting for at least 8 hours.   VAS [place] LOWER EXTREMITY VENOUS (DVT) (ONLY [facility] & [facility])  Result Date: 10/01/2021  Lower Venous DVT Study Patient Name:  ROCHELLE NEPHEW  Date of Exam:   10/01/2021 Medical Rec #: [id number]   Accession #:    [id number] Date of Birth: Dec 15, 1945   Patient Gender: M Patient Age:   50 years Exam Location:  [facility] Procedure:      VAS [place] LOWER EXTREMITY VENOUS (DVT) Referring Phys: Benjamine Mola REES --------------------------------------------------------------------------------  Indications: Edema.  Comparison Study: No previous exam noted. Performing Technologist: Bobetta Lime BS, RVT  Examination Guidelines: A complete evaluation includes B-mode  imaging, spectral Doppler, color Doppler, and power Doppler as needed of all accessible portions of each vessel. Bilateral testing is considered an integral part of a complete examination. Limited examinations for reoccurring indications may be performed as noted. The reflux portion of the exam is performed with the patient in reverse Trendelenburg.  +-----+---------------+---------+-----------+----------+--------------+ RIGHTCompressibilityPhasicitySpontaneityPropertiesThrombus Aging +-----+---------------+---------+-----------+----------+--------------+ CFV  Full           Yes      Yes                                 +-----+---------------+---------+-----------+----------+--------------+   +---------+---------------+---------+-----------+----------+--------------+ LEFT     CompressibilityPhasicitySpontaneityPropertiesThrombus Aging +---------+---------------+---------+-----------+----------+--------------+ CFV      Full           Yes      Yes                                 +---------+---------------+---------+-----------+----------+--------------+ SFJ      Full                                                        +---------+---------------+---------+-----------+----------+--------------+ FV Prox  Full                                                        +---------+---------------+---------+-----------+----------+--------------+ FV Mid   Full                                                        +---------+---------------+---------+-----------+----------+--------------+ FV DistalFull                                                        +---------+---------------+---------+-----------+----------+--------------+  PFV      Full                                                        +---------+---------------+---------+-----------+----------+--------------+ POP      Full           Yes      Yes                                  +---------+---------------+---------+-----------+----------+--------------+ PTV      Full                                                        +---------+---------------+---------+-----------+----------+--------------+ PERO     Full                                                        +---------+---------------+---------+-----------+----------+--------------+     Summary: RIGHT: - No evidence of common femoral vein obstruction.  LEFT: - No evidence of deep vein thrombosis in the lower extremity. No indirect evidence of obstruction proximal to the inguinal ligament. - No cystic structure found in the popliteal fossa.  *See table(s) above for measurements and observations.    Preliminary    CT Angio Chest PE W/Cm &/Or Wo Cm  Result Date: 10/01/2021 CLINICAL DATA:  76 year old male with suspected pulmonary embolism. EXAM: CT ANGIOGRAPHY CHEST WITH CONTRAST TECHNIQUE: Multidetector CT imaging of the chest was performed using the standard protocol during bolus administration of intravenous contrast. Multiplanar CT image reconstructions and MIPs were obtained to evaluate the vascular anatomy. RADIATION DOSE REDUCTION: This exam was performed according to the departmental dose-optimization program which includes automated exposure control, adjustment of the mA and/or kV according to patient size and/or use of iterative reconstruction technique. CONTRAST:  53m OMNIPAQUE IOHEXOL 350 MG/ML SOLN COMPARISON:  Chest CT 05/06/2019. FINDINGS: Cardiovascular: No filling defects within the pulmonary arterial tree to suggest pulmonary embolism. Heart size is normal. There is no significant pericardial fluid, thickening or pericardial calcification. There is aortic atherosclerosis, as well as atherosclerosis of the great vessels of the mediastinum and the coronary arteries, including calcified atherosclerotic plaque in the left anterior descending, left circumflex and right coronary arteries. Mediastinum/Nodes:  No pathologically enlarged mediastinal or hilar lymph nodes. Esophagus is unremarkable in appearance. No axillary lymphadenopathy. Lungs/Pleura: Extensive bronchial wall thickening noted throughout the lungs bilaterally. There is also patchy thickening of the peribronchovascular interstitium and extensive peribronchovascular airspace consolidation most severe in the left lower lobe. Small left pleural effusion in the base of the left hemithorax. No right pleural effusion. Upper Abdomen: Aortic atherosclerosis. Status post cholecystectomy. Liver has a shrunken appearance and nodular contour, suggesting underlying cirrhosis. Musculoskeletal: There are no aggressive appearing lytic or blastic lesions noted in the visualized portions of the skeleton. Review of the MIP images confirms the above findings. IMPRESSION: 1. No evidence of pulmonary embolism. 2. Bronchitis with left lower lobe bronchopneumonia. 3. Small  parapneumonic pleural effusion in the base of the left hemithorax. 4. Aortic atherosclerosis, in addition to three-vessel coronary artery disease. Please note that although the presence of coronary artery calcium documents the presence of coronary artery disease, the severity of this disease and any potential stenosis cannot be assessed on this non-gated CT examination. Assessment for potential risk factor modification, dietary therapy or pharmacologic therapy may be warranted, if clinically indicated. Aortic Atherosclerosis (ICD10-I70.0). Electronically Signed   By: Vinnie Langton M.D.   On: 10/01/2021 07:30   DG Chest Port 1 View  Result Date: 10/01/2021 CLINICAL DATA:  Chest pain and cough. EXAM: PORTABLE CHEST 1 VIEW COMPARISON:  August 28, 2019 FINDINGS: The heart size and mediastinal contours are within normal limits. Mild to moderate severity areas of bibasilar atelectasis and/or infiltrate are noted. There is no evidence of a pleural effusion or pneumothorax. The visualized skeletal structures are  unremarkable. IMPRESSION: Mild to moderate severity bibasilar atelectasis and/or infiltrate. Electronically Signed   By: Virgina Norfolk M.D.   On: 10/01/2021 04:27    Pending Labs Unresulted Labs (From admission, onward)     Start     Ordered   10/02/21 0960  Basic metabolic panel  Tomorrow morning,   R        10/01/21 0937   10/02/21 0500  CBC  Tomorrow morning,   R        10/01/21 0937   10/01/21 0936  Expectorated Sputum Assessment w Gram Stain, Rflx to Resp Cult  (COPD / Pneumonia / Cellulitis / Lower Extremity Wound)  Once,   R        10/01/21 0937   10/01/21 0859  MRSA Next Gen by PCR, Nasal  (MRSA Screening)  ONCE - URGENT,   URGENT        10/01/21 0858   10/01/21 0354  Resp Panel by RT-PCR (Flu A&B, Covid) Urine, Clean Catch  Once,   URGENT        10/01/21 0353   10/01/21 0353  Urine Culture  (Undifferentiated presentation (screening labs and basic nursing orders))  ONCE - URGENT,   URGENT       Question:  Indication  Answer:  Sepsis   10/01/21 0353            Vitals/Pain Today's Vitals   10/01/21 0832 10/01/21 0900 10/01/21 1019 10/01/21 1100  BP:  128/62 (!) 124/57 138/65  Pulse:  78  79  Resp:  20  19  Temp: 99.2 F (37.3 C)     TempSrc:      SpO2:  99%  98%  PainSc:        Isolation Precautions No active isolations  Medications Medications  aspirin EC tablet 81 mg (has no administration in time range)  hydrALAZINE (APRESOLINE) tablet 10 mg (10 mg Oral Given 10/01/21 1019)  insulin aspart protamine- aspart (NOVOLOG MIX 70/30) injection 35 Units (35 Units Subcutaneous Given 10/01/21 1136)  levothyroxine (SYNTHROID) tablet 75 mcg (75 mcg Oral Given 10/01/21 1019)  predniSONE (DELTASONE) tablet 10 mg (has no administration in time range)  pantoprazole (PROTONIX) EC tablet 40 mg (40 mg Oral Given 10/01/21 1019)  clopidogrel (PLAVIX) tablet 75 mg (75 mg Oral Given 10/01/21 1019)  cefTRIAXone (ROCEPHIN) 2 g in sodium chloride 0.9 % 100 mL IVPB (has no  administration in time range)  azithromycin (ZITHROMAX) 500 mg in sodium chloride 0.9 % 250 mL IVPB (has no administration in time range)  enoxaparin (LOVENOX) injection 40 mg (40 mg Subcutaneous  Given 10/01/21 1020)  0.9 %  sodium chloride infusion ( Intravenous New Bag/Given 10/01/21 1024)  acetaminophen (TYLENOL) tablet 650 mg (has no administration in time range)    Or  acetaminophen (TYLENOL) suppository 650 mg (has no administration in time range)  docusate sodium (COLACE) capsule 100 mg (100 mg Oral Given 10/01/21 1019)  bisacodyl (DULCOLAX) EC tablet 5 mg (has no administration in time range)  ondansetron (ZOFRAN) tablet 4 mg (has no administration in time range)    Or  ondansetron (ZOFRAN) injection 4 mg (has no administration in time range)  guaiFENesin (MUCINEX) 12 hr tablet 600 mg (has no administration in time range)  hydrALAZINE (APRESOLINE) injection 5 mg (has no administration in time range)  insulin aspart (novoLOG) injection 0-15 Units (3 Units Subcutaneous Given 10/01/21 1135)  insulin aspart (novoLOG) injection 0-5 Units (has no administration in time range)  sodium chloride flush (NS) 0.9 % injection 3 mL (3 mLs Intravenous Not Given 10/01/21 1021)  oxyCODONE (Oxy IR/ROXICODONE) immediate release tablet 5 mg (has no administration in time range)  albuterol (PROVENTIL) (2.5 MG/3ML) 0.083% nebulizer solution 2.5 mg (has no administration in time range)  sodium chloride 0.9 % bolus 500 mL (0 mLs Intravenous Stopped 10/01/21 0535)  fentaNYL (SUBLIMAZE) injection 50 mcg (50 mcg Intravenous Given 10/01/21 0546)  ceFEPIme (MAXIPIME) 2 g in sodium chloride 0.9 % 100 mL IVPB (0 g Intravenous Stopped 10/01/21 0638)  vancomycin (VANCOREADY) IVPB 1500 mg/300 mL (0 mg Intravenous Stopped 10/01/21 0830)  fentaNYL (SUBLIMAZE) 50 MCG/ML injection (  [facility] to [facility] 10/01/21 0600)  iohexol (OMNIPAQUE) 350 MG/ML injection 50 mL (50 mLs Intravenous Contrast Given 10/01/21 0720)  hydrocortisone  sodium succinate (SOLU-CORTEF) 100 MG injection 100 mg (100 mg Intravenous Given 10/01/21 0743)    Mobility walks Moderate fall risk   Focused Assessments Cardiac Assessment Handoff:  Cardiac Rhythm: Normal sinus rhythm Lab Results  Component Value Date   CKTOTAL 82 11/09/2010   CKMB 2.2 11/09/2010   TROPONINI <0.30 11/09/2010   Lab Results  Component Value Date   DDIMER 1.08 (H) 11/09/2010   Does the Patient currently have chest pain? No    R Recommendations: See Admitting Provider Note  Report given to:   Additional Notes:

## 2021-10-02 DIAGNOSIS — J189 Pneumonia, unspecified organism: Secondary | ICD-10-CM | POA: Diagnosis not present

## 2021-10-02 LAB — CBC
HCT: 30.4 % — ABNORMAL LOW (ref 39.0–52.0)
Hemoglobin: 9.3 g/dL — ABNORMAL LOW (ref 13.0–17.0)
MCH: 26.5 pg (ref 26.0–34.0)
MCHC: 30.6 g/dL (ref 30.0–36.0)
MCV: 86.6 fL (ref 80.0–100.0)
Platelets: 264 10*3/uL (ref 150–400)
RBC: 3.51 MIL/uL — ABNORMAL LOW (ref 4.22–5.81)
RDW: 21 % — ABNORMAL HIGH (ref 11.5–15.5)
WBC: 11.3 10*3/uL — ABNORMAL HIGH (ref 4.0–10.5)
nRBC: 0 % (ref 0.0–0.2)

## 2021-10-02 LAB — GLUCOSE, CAPILLARY
Glucose-Capillary: 49 mg/dL — ABNORMAL LOW (ref 70–99)
Glucose-Capillary: 71 mg/dL (ref 70–99)
Glucose-Capillary: 209 mg/dL — ABNORMAL HIGH (ref 70–99)
Glucose-Capillary: 403 mg/dL — ABNORMAL HIGH (ref 70–99)
Glucose-Capillary: 435 mg/dL — ABNORMAL HIGH (ref 70–99)

## 2021-10-02 LAB — BASIC METABOLIC PANEL
Anion gap: 8 (ref 5–15)
BUN: 23 mg/dL (ref 8–23)
CO2: 22 mmol/L (ref 22–32)
Calcium: 7.9 mg/dL — ABNORMAL LOW (ref 8.9–10.3)
Chloride: 106 mmol/L (ref 98–111)
Creatinine, Ser: 1.53 mg/dL — ABNORMAL HIGH (ref 0.61–1.24)
GFR, Estimated: 47 mL/min — ABNORMAL LOW (ref >60)
Glucose, Bld: 219 mg/dL — ABNORMAL HIGH (ref 70–99)
Potassium: 4.1 mmol/L (ref 3.5–5.1)
Sodium: 136 mmol/L (ref 135–145)

## 2021-10-02 LAB — URINE CULTURE

## 2021-10-02 LAB — SARS CORONAVIRUS 2 BY RT PCR: SARS Coronavirus 2 by RT PCR: NEGATIVE

## 2021-10-02 MED ORDER — DM-GUAIFENESIN ER 30-600 MG PO TB12
1.0000 | ORAL_TABLET | Freq: Two times a day (BID) | ORAL | Status: DC
  Administered 2021-10-02 – 2021-10-06 (×9): 1 via ORAL
  Filled 2021-10-02 (×9): qty 1

## 2021-10-02 MED ORDER — PREDNISONE 10 MG PO TABS: 10.0000 mg | ORAL_TABLET | Freq: Every day | ORAL | Status: DC

## 2021-10-02 MED ORDER — INSULIN ASPART PROT & ASPART (70-30 MIX) 100 UNIT/ML ~~LOC~~ SUSP
30.0000 [IU] | Freq: Two times a day (BID) | SUBCUTANEOUS | Status: DC
  Administered 2021-10-02 – 2021-10-03 (×2): 30 [IU] via SUBCUTANEOUS
  Filled 2021-10-02: qty 10

## 2021-10-02 MED ORDER — ARFORMOTEROL TARTRATE 15 MCG/2ML IN NEBU
15.0000 ug | INHALATION_SOLUTION | Freq: Two times a day (BID) | RESPIRATORY_TRACT | Status: DC
  Administered 2021-10-02 – 2021-10-04 (×4): 15 ug via RESPIRATORY_TRACT
  Filled 2021-10-02 (×4): qty 2

## 2021-10-02 MED ORDER — BUDESONIDE 0.25 MG/2ML IN SUSP
0.2500 mg | Freq: Two times a day (BID) | RESPIRATORY_TRACT | Status: DC
Start: 2021-10-02 — End: 2021-10-06
  Administered 2021-10-02 – 2021-10-06 (×8): 0.25 mg via RESPIRATORY_TRACT
  Filled 2021-10-02 (×8): qty 2

## 2021-10-02 MED ORDER — INSULIN ASPART PROT & ASPART (70-30 MIX) 100 UNIT/ML ~~LOC~~ SUSP
20.0000 [IU] | Freq: Two times a day (BID) | SUBCUTANEOUS | Status: DC
  Filled 2021-10-02: qty 10

## 2021-10-02 MED ORDER — PREDNISONE 20 MG PO TABS
20.0000 mg | ORAL_TABLET | Freq: Every day | ORAL | Status: DC
Start: 2021-10-04 — End: 2021-10-03

## 2021-10-02 MED ORDER — DOXYCYCLINE HYCLATE 100 MG PO TABS
100.0000 mg | ORAL_TABLET | Freq: Two times a day (BID) | ORAL | Status: DC
  Administered 2021-10-02 – 2021-10-06 (×9): 100 mg via ORAL
  Filled 2021-10-02 (×9): qty 1

## 2021-10-02 MED ORDER — METHYLPREDNISOLONE SODIUM SUCC 40 MG IJ SOLR
40.0000 mg | Freq: Every day | INTRAMUSCULAR | Status: DC
  Administered 2021-10-02: 40 mg via INTRAVENOUS
  Filled 2021-10-02: qty 1

## 2021-10-02 MED ORDER — BENZONATATE 100 MG PO CAPS
100.0000 mg | ORAL_CAPSULE | Freq: Three times a day (TID) | ORAL | Status: DC
  Administered 2021-10-02 – 2021-10-06 (×11): 100 mg via ORAL
  Filled 2021-10-02 (×11): qty 1

## 2021-10-02 NOTE — Hospital Course (Signed)
Past medical history of adrenal insufficiency, CAD SP stent, type II DM, sarcoidosis, OSA on CPAP, hypothyroidism, CKD 3a, presented to the hospital with complaints of cough and chest wall pain.  Found to have left lower lobe pneumonia.  Started on IV antibiotics.  There is also concern for sarcoidosis flareup and patient was given some steroids. Suspect suffering from acute on chronic diastolic CHF, now responding to aggressive diuresis.

## 2021-10-02 NOTE — Progress Notes (Signed)
Progress Note Patient: [name] ZDG:[id number] DOB: 1945-11-27 DOA: 10/01/2021  DOS: the patient was seen and examined on 10/02/2021  Brief hospital course: Past medical history of adrenal insufficiency, CAD SP stent, type II DM, sarcoidosis, OSA on CPAP, hypothyroidism, CKD 3a, presented to the hospital with complaints of cough and chest wall pain.  Found to have left lower lobe pneumonia.  Started on IV antibiotics.  On my examination also appears to have flareup of sarcoidosis versus acute bronchiolitis. Assessment and Plan: Left lower lobe pneumonia. Acute flareup of sarcoidosis. Possibility of acute bronchiolitis. Left parapneumonic pleural effusion Presents with complaints of cough and shortness of breath as well as fever at home. Patient actually required oxygen earlier but currently on room air at 93%. Chest x-ray showed bibasilar atelectasis. A CT PE protocol was performed due to patient's recent hospitalization which was negative for PE but did show bronchitis with left lower lobe pneumonia. There is also parapneumonic pleural effusion. Patient was started on IV ceftriaxone and azithromycin.  Currently we will switch him to IV ceftriaxone and doxycycline.  Continue to follow-up on cultures. Check COVID which was ordered but not done on admission. We will also add steroids.  Also adding Brovana and Pulmicort.  Continue albuterol as needed. Patient was recommended to stay off of the steroids as wound healing can be an issue although patient is 7 days out of his procedure for now.  Type diabetes mellitus, uncontrolled with hyperand hypoglycemia with long-term insulin use with nephropathy Hemoglobin A1c currently pending. Blood glucose initially were very low and therefore his 7030 was held in the morning but later on became 400 after receiving steroids. We will continue 7030 still at a lower dose and twice daily. Continue sliding scale, monitor.  Lumbar radiculopathy. Recently  hospitalized for lumbar fusion. Pain improving.  Continue PT OT consultation.  CAD. Currently no acute issues. Continue aspirin Plavix.  HTN. Blood pressure stable. Continue current regimen.  Hypothyroidism. Continue Synthroid. HLD. Continue statin.  OSA on CPAP. Class I obesity Continue CPAP nightly. Body mass index is 30.65 kg/m.  Placing the pt at higher risk of poor outcomes.  CKD 3A. Renal function currently stable.  Monitor.  Subjective: Continues to have some cough or congestion or shortness of breath.  No nausea or vomiting.  Also reports fatigue and tiredness.  Physical Exam: Vitals:   10/01/21 2044 10/02/21 0407 10/02/21 0731 10/02/21 1309  BP: (!) 160/70 (!) 123/53 (!) 156/48 (!) 143/69  Pulse:  70 69 (!) 102  Resp:  '18 18 18  '$ Temp:  98.5 F (36.9 C) 98 F (36.7 C) 98.9 F (37.2 C)  TempSrc:  Oral    SpO2:  99%  93%  Weight:      Height:       General: Appear in moderate distress; no visible Abnormal Neck Mass Or lumps, Conjunctiva normal Cardiovascular: S1 and S2 Present, no Murmur, Respiratory: increased respiratory effort, Bilateral Air entry present and bilateral  Crackles, bilateral  wheezes Abdomen: Bowel Sound present, Non tender  Extremities: bilateral Pedal edema Neurology: alert and oriented to time, place, and person  Gait not checked due to patient safety concerns   Data Reviewed: I have Reviewed nursing notes, Vitals, and Lab results since pt's last encounter. Pertinent lab results CBC and BMP I have ordered test including CBC and BMP    Family Communication: Multiple family members at bedside  Disposition: Status is: Inpatient Remains inpatient appropriate because: Continue IV antibiotics and monitor for improvement in  expiratory wheezes.  Author: Berle Mull, MD 10/02/2021 7:21 PM  Please look on [email or web address] to find out who is on call.

## 2021-10-02 NOTE — TOC Progression Note (Signed)
Transition of Care [facility]) - Progression Note    Patient Details  Name: [name] MRN: [id number] Date of Birth: 07/30/1945  [facility]) CM/SW Contact  Bartholomew Crews, RN Phone Number: [phone] 10/02/2021, 9:41 AM  Clinical Narrative:     Confirmed with Delsa Sale at [facility] that patient is active with PT. Will need HH PT orders at discharge for resumption of services.        Expected Discharge Plan and Services                                                 Social Determinants of Health (SDOH) Interventions    Readmission Risk Interventions     No data to display

## 2021-10-02 NOTE — Progress Notes (Signed)
Mobility Specialist Progress Note:   10/02/21 1300  Mobility  Activity Ambulated with assistance in hallway  Level of Assistance Contact guard assist, steadying assist  Assistive Device Front wheel walker  Distance Ambulated (ft) 150 ft  Activity Response Tolerated well  $Mobility charge 1 Mobility   Pt agreeable to mobility session. Required min guard assist throughout. SpO2 >93% on RA entirety of session. Pt back in chair with all needs met.   Nelta Numbers Acute Rehab Secure Chat or Office Phone: [phone]

## 2021-10-02 NOTE — Plan of Care (Signed)

## 2021-10-02 NOTE — Evaluation (Signed)
Physical Therapy Evaluation Patient Details Name: [name] MRN: [id number] DOB: March 07, 1946 Today's Date: 10/02/2021  History of Present Illness  Pt is a 76 y.o. male with medical history significant of adrenal insufficiency; stage 3 CKD; CAD s/p stents; DM; sarcoidosis; HTN; OSA on CPAP; and hypothyroidism presenting with cough, chest pain. He was admitted from 7/9-13 for intractable back pain from lumbar radiculopathy and treated unsuccessfully with steroids and so returned from 7/19-22 for lumbar fusion that was performed on 7/21 with improvement in symptoms.  He reports that he has been progressing well but he developed cough on 7/25. Yesterday, he went to neurosurgery for f/u and his wife was concerned about PNA. On the way home the patient became acutely ill and fatigued and remained that way overnight and so she called 911.  Clinical Impression  Pt agreeable to physical therapy evaluation/treatment session. Pt currently performing bed mobility at modified independent level while performing transfers and gait at mostly standby A level. Pt with shortness of breath during gait but oxygen saturations >/= 95% on room air. Pt currently presents with functional limitations secondary to impairments listed in PT problem list. Pt to benefit from skilled, acute care physical therapy interventions to maximize his independence level and quality of life.       Recommendations for follow up therapy are one component of a multi-disciplinary discharge planning process, led by the attending physician.  Recommendations may be updated based on patient status, additional functional criteria and insurance authorization.  Follow Up Recommendations Home health PT      Assistance Recommended at Discharge Intermittent Supervision/Assistance  Patient can return home with the following  A little help with walking and/or transfers;A little help with bathing/dressing/bathroom;Assistance with cooking/housework;Assist for  transportation    Equipment Recommendations None recommended by PT  Recommendations for Other Services       Functional Status Assessment Patient has had a recent decline in their functional status and demonstrates the ability to make significant improvements in function in a reasonable and predictable amount of time.     Precautions / Restrictions Precautions Precautions: Fall;Back Precaution Booklet Issued: No Precaution Comments: Reviewed back precautions (teach back) Restrictions Weight Bearing Restrictions: No      Mobility  Bed Mobility Overal bed mobility: Modified Independent Bed Mobility: Supine to Sit     Supine to sit: Modified independent (Device/Increase time), HOB elevated     General bed mobility comments: bed rail utilized    Transfers Overall transfer level: Needs assistance Equipment used: Rolling walker (2 wheels) Transfers: Sit to/from Stand Sit to Stand: Min guard, Supervision (standby A)           General transfer comment: CGA for safety only during initial sit to stand from bed. Pt required increased cues for safety awareness when sitting down in chair after gait due to moving RW away from him. Pt also performed 5 consecutive sit <> stands from chair with B UE use in 17.56 seconds for 5 times sit to stand test.    Ambulation/Gait Ambulation/Gait assistance: Min guard, Supervision (standby A) Gait Distance (Feet): 150 Feet Assistive device: Rolling walker (2 wheels) Gait Pattern/deviations: Step-through pattern, Trunk flexed Gait velocity: Decreased Gait velocity interpretation: <1.31 ft/sec, indicative of household ambulator   General Gait Details: Pt with fairly good stability and no LOB occurred. Pt with shortness of breath but HR and SpO2 remained stable. Min cues for pacing.  Stairs            Emergency planning/management officer  Modified Rankin (Stroke Patients Only)       Balance Overall balance assessment: Needs  assistance Sitting-balance support: No upper extremity supported Sitting balance-Leahy Scale: Good     Standing balance support: Reliant on assistive device for balance, No upper extremity supported, Bilateral upper extremity supported Standing balance-Leahy Scale: Fair Standing balance comment: Pt able to perform sit to stands from chair without RW                             Pertinent Vitals/Pain Pain Assessment Pain Assessment: 0-10 Pain Score: 0-No pain Pain Location: no back pain; denies chest pain except when coughing at 5/10 Pain Intervention(s): Limited activity within patient's tolerance, Monitored during session    Home Living Family/patient expects to be discharged to:: Private residence Living Arrangements: Spouse/significant other Available Help at Discharge: Family;Available 24 hours/day Type of Home: House Home Access: Ramped entrance Entrance Stairs-Rails: Can reach both Entrance Stairs-Number of Steps: 6.  Pt has been going up ramp at 3rd entrance since discharge.   Home Layout: One level Home Equipment: Conservation officer, nature (2 wheels);Rollator (4 wheels);Electric scooter;Wheelchair - manual;BSC/3in1;Grab bars - toilet;Grab bars - tub/shower;Hand held shower head;Shower seat - built in;Shower seat Additional Comments: Hearing Aids. 2 scooters: one for inside and one for car with lift.    Prior Function Prior Level of Function : Needs assist  Cognitive Assist :  (pt able to manage his medications and finances)     Physical Assist : ADLs (physical)   ADLs (physical): IADLs Mobility Comments: Using scooters to get into and out of house. Pt using both RW and rollator within house. Pt has been receiving home health physical therapy. Pt's wife helped pt with sit to stand transfers initially since last discharge but now pt able to do on his own. Pt was not able to walk four weeks ago prior to surgery. ADLs Comments: Pt independent with bathing and dressing (set  up only from wife for dressing). Pt not driving and is retired. Pt's wife taking care of housework and cooking etc.     Hand Dominance   Dominant Hand: Right    Extremity/Trunk Assessment   Upper Extremity Assessment Upper Extremity Assessment: Overall WFL for tasks assessed (biceps 4/5 B)    Lower Extremity Assessment Lower Extremity Assessment:  (Grossly 4-/5 in major muscle groups; limited L ankle 2/2 previous surgery; sensation intact to light touch)       Communication   Communication: No difficulties  Cognition Arousal/Alertness: Awake/alert Behavior During Therapy: WFL for tasks assessed/performed Overall Cognitive Status: Within Functional Limits for tasks assessed                                          General Comments General comments (skin integrity, edema, etc.): 69 HR, 97 SpO2, and 18 RR on 0.5 L O2. 98% SpO2 on RA    Exercises     Assessment/Plan    PT Assessment Patient needs continued PT services  PT Problem List Decreased strength;Decreased range of motion;Decreased activity tolerance;Cardiopulmonary status limiting activity;Decreased balance;Decreased mobility;Pain       PT Treatment Interventions Gait training;DME instruction;Functional mobility training;Therapeutic activities;Therapeutic exercise;Balance training;Patient/family education;Neuromuscular re-education    PT Goals (Current goals can be found in the Care Plan section)  Acute Rehab PT Goals Patient Stated Goal: to be able to travel more PT Goal  Formulation: With patient Time For Goal Achievement: 10/09/21 Potential to Achieve Goals: Good    Frequency Min 2X/week     Co-evaluation               AM-PAC PT "6 Clicks" Mobility  Outcome Measure Help needed turning from your back to your side while in a flat bed without using bedrails?: None Help needed moving from lying on your back to sitting on the side of a flat bed without using bedrails?: None Help needed  moving to and from a bed to a chair (including a wheelchair)?: A Little Help needed standing up from a chair using your arms (e.g., wheelchair or bedside chair)?: A Little Help needed to walk in hospital room?: A Little Help needed climbing 3-5 steps with a railing? : A Little 6 Click Score: 20    End of Session Equipment Utilized During Treatment: Gait belt Activity Tolerance: Patient tolerated treatment well;Patient limited by fatigue Patient left: in chair;with call bell/phone within reach;with family/visitor present Nurse Communication: Mobility status PT Visit Diagnosis: Difficulty in walking, not elsewhere classified (R26.2);Pain;Muscle weakness (generalized) (M62.81)    Time: 5102-5852 PT Time Calculation (min) (ACUTE ONLY): 26 min   Charges:   PT Evaluation $PT Eval Low Complexity: 1 Low PT Treatments $Therapeutic Activity: 8-22 mins        Donna Bernard, PT   [facility] 10/02/2021, 9:27 AM

## 2021-10-02 NOTE — Progress Notes (Signed)
OT Cancellation Note and Discharge  Patient Details Name: [name] MRN: [id number] DOB: 1946/02/04   Cancelled Treatment:    Reason Eval/Treat Not Completed: OT screened, no needs identified, will sign off. Pt and wife report they have basic ADLs covered from his recent back surgery standpoint and he has all the DME/AE he needs. \  Golden Circle, OTR/L Acute Rehab Services [facility] [phone] Office [phone]    Almon Register 10/02/2021, 10:07 AM

## 2021-10-03 DIAGNOSIS — J189 Pneumonia, unspecified organism: Secondary | ICD-10-CM | POA: Diagnosis not present

## 2021-10-03 LAB — CBC
HCT: 34.9 % — ABNORMAL LOW (ref 39.0–52.0)
Hemoglobin: 10.2 g/dL — ABNORMAL LOW (ref 13.0–17.0)
MCH: 26 pg (ref 26.0–34.0)
MCHC: 29.2 g/dL — ABNORMAL LOW (ref 30.0–36.0)
MCV: 89 fL (ref 80.0–100.0)
Platelets: 271 10*3/uL (ref 150–400)
RBC: 3.92 MIL/uL — ABNORMAL LOW (ref 4.22–5.81)
RDW: 20.9 % — ABNORMAL HIGH (ref 11.5–15.5)
WBC: 13.2 10*3/uL — ABNORMAL HIGH (ref 4.0–10.5)
nRBC: 0.2 % (ref 0.0–0.2)

## 2021-10-03 LAB — BASIC METABOLIC PANEL
Anion gap: 9 (ref 5–15)
BUN: 20 mg/dL (ref 8–23)
CO2: 21 mmol/L — ABNORMAL LOW (ref 22–32)
Calcium: 8.5 mg/dL — ABNORMAL LOW (ref 8.9–10.3)
Chloride: 106 mmol/L (ref 98–111)
Creatinine, Ser: 1.53 mg/dL — ABNORMAL HIGH (ref 0.61–1.24)
GFR, Estimated: 47 mL/min — ABNORMAL LOW (ref >60)
Glucose, Bld: 314 mg/dL — ABNORMAL HIGH (ref 70–99)
Potassium: 3.8 mmol/L (ref 3.5–5.1)
Sodium: 136 mmol/L (ref 135–145)

## 2021-10-03 LAB — MAGNESIUM: Magnesium: 1.7 mg/dL (ref 1.7–2.4)

## 2021-10-03 LAB — GLUCOSE, RANDOM: Glucose, Bld: 480 mg/dL — ABNORMAL HIGH (ref 70–99)

## 2021-10-03 LAB — GLUCOSE, CAPILLARY
Glucose-Capillary: 186 mg/dL — ABNORMAL HIGH (ref 70–99)
Glucose-Capillary: 223 mg/dL — ABNORMAL HIGH (ref 70–99)
Glucose-Capillary: 229 mg/dL — ABNORMAL HIGH (ref 70–99)
Glucose-Capillary: 308 mg/dL — ABNORMAL HIGH (ref 70–99)
Glucose-Capillary: 341 mg/dL — ABNORMAL HIGH (ref 70–99)
Glucose-Capillary: 430 mg/dL — ABNORMAL HIGH (ref 70–99)
Glucose-Capillary: 480 mg/dL — ABNORMAL HIGH (ref 70–99)

## 2021-10-03 LAB — RESP PANEL BY RT-PCR (FLU A&B, COVID) ARPGX2
Influenza A by PCR: NEGATIVE
Influenza B by PCR: NEGATIVE
SARS Coronavirus 2 by RT PCR: NEGATIVE

## 2021-10-03 MED ORDER — INSULIN ASPART PROT & ASPART (70-30 MIX) 100 UNIT/ML ~~LOC~~ SUSP
35.0000 [IU] | Freq: Three times a day (TID) | SUBCUTANEOUS | Status: DC
  Administered 2021-10-03 – 2021-10-04 (×5): 35 [IU] via SUBCUTANEOUS

## 2021-10-03 MED ORDER — FUROSEMIDE 10 MG/ML IJ SOLN
40.0000 mg | Freq: Once | INTRAMUSCULAR | Status: AC
  Administered 2021-10-03: 40 mg via INTRAVENOUS
  Filled 2021-10-03: qty 4

## 2021-10-03 MED ORDER — INSULIN ASPART 100 UNIT/ML IJ SOLN
0.0000 [IU] | Freq: Three times a day (TID) | INTRAMUSCULAR | Status: DC
  Administered 2021-10-03 (×2): 7 [IU] via SUBCUTANEOUS
  Administered 2021-10-03: 15 [IU] via SUBCUTANEOUS
  Administered 2021-10-04: 20 [IU] via SUBCUTANEOUS
  Administered 2021-10-04: 3 [IU] via SUBCUTANEOUS
  Administered 2021-10-04: 4 [IU] via SUBCUTANEOUS

## 2021-10-03 MED ORDER — PREDNISONE 20 MG PO TABS
20.0000 mg | ORAL_TABLET | Freq: Every day | ORAL | Status: AC
Start: 2021-10-03 — End: 2021-10-04
  Administered 2021-10-03 – 2021-10-04 (×2): 20 mg via ORAL
  Filled 2021-10-03 (×2): qty 1

## 2021-10-03 MED ORDER — PREDNISONE 20 MG PO TABS: 20.0000 mg | ORAL_TABLET | Freq: Every day | ORAL | Status: DC

## 2021-10-03 MED ORDER — INSULIN ASPART 100 UNIT/ML IJ SOLN: 0.0000 [IU] | Freq: Every day | INTRAMUSCULAR | Status: DC

## 2021-10-03 MED ORDER — PREDNISONE 10 MG PO TABS
10.0000 mg | ORAL_TABLET | Freq: Every day | ORAL | Status: DC
  Administered 2021-10-05 – 2021-10-06 (×2): 10 mg via ORAL
  Filled 2021-10-03 (×2): qty 1

## 2021-10-03 NOTE — Progress Notes (Signed)
On call MD aware of pts CBG. Stated to give morning dose of 70/30 early.

## 2021-10-03 NOTE — Progress Notes (Signed)
Progress Note Patient: [name] WUX:[id number] DOB: [birth year] DOA: 10/01/2021  DOS: the patient was seen and examined on 10/03/2021  Brief hospital course: Past medical history of adrenal insufficiency, CAD SP stent, type II DM, sarcoidosis, OSA on CPAP, hypothyroidism, CKD 3a, presented to the hospital with complaints of cough and chest wall pain.  Found to have left lower lobe pneumonia.  Started on IV antibiotics.  On my examination also appears to have flareup of sarcoidosis versus acute bronchiolitis.  Assessment and Plan: Left lower lobe pneumonia. Acute flareup of sarcoidosis. Possibility of acute bronchiolitis. Left parapneumonic pleural effusion Presents with complaints of cough and shortness of breath as well as fever at home. Patient actually required oxygen earlier but currently on room air at 93%. Chest x-ray showed bibasilar atelectasis. A CT PE protocol was performed due to patient's recent hospitalization which was negative for PE but did show bronchitis with left lower lobe pneumonia. There is also parapneumonic pleural effusion. Patient was started on IV ceftriaxone and azithromycin.  Currently on IV ceftriaxone and doxycycline.  Continue to follow-up on cultures. COVID-negative. We will also add steroids.  Also adding Brovana and Pulmicort.  Continue albuterol as needed. Patient was recommended to stay off of the steroids as wound healing can be an issue although patient is 7 days out of his procedure for now.  Type diabetes mellitus, uncontrolled with hyper and hypoglycemia with long-term insulin use with nephropathy Hemoglobin A1c currently pending. Blood glucose initially were very low and therefore his 70/30 was held in the morning but later on became 400 after receiving steroids. Currently on insulin 70/30 35 units 3 times daily along with sliding scale insulin Due to hyperglycemia we are tapering the prednisone rapidly.  Chronic HFpEF Echocardiogram in March  shows EF of 55 to 60%. Patient on 80 mg of furosemide at home. He recently stopped taking this medication few weeks ago as his leg swelling was significantly better. Currently we will give him IV Lasix 40 mg x 1 and monitor renal function and swelling in the leg as well as respiratory status.  Lumbar radiculopathy. Recently hospitalized for lumbar fusion. Pain improving.  Continue PT OT consultation.  CAD. Currently no acute issues. Continue aspirin Plavix.  HTN. Blood pressure stable. Continue current regimen.  Hypothyroidism. Continue Synthroid.  HLD. Continue statin.  OSA on CPAP. Class I obesity Continue CPAP nightly. Body mass index is 30.65 kg/m.  Placing the pt at higher risk of poor outcomes.  CKD 3A. Baseline serum creatinine around 1.4-1.8  Currently serum creatinine 1.5  Monitor while patient receiving diuresis.  Subjective: Continues to have shortness of breath. Required multiple nebulizer therapy last night.  No nausea no vomiting.  Has swelling in the legs right now.  Physical Exam: Vitals:   10/03/21 0300 10/03/21 0747 10/03/21 0805 10/03/21 1503  BP: (!) 155/79  (!) 151/66 (!) 154/69  Pulse: 82  99 81  Resp: 15     Temp: 98.6 F (37 C)  97.9 F (36.6 C) 98.1 F (36.7 C)  TempSrc: Oral  Oral Oral  SpO2: 99% 96% 94% 99%  Weight:      Height:       General: Appear in mild distress, no Rash; Oral Mucosa Clear, moist. no Abnormal Neck Mass Or lumps, Conjunctiva normal  Cardiovascular: S1 and S2 Present, no Murmur, Respiratory: increased respiratory effort, Bilateral Air entry present and bilateral  Crackles, no wheezes Abdomen: Bowel Sound present, Soft and no tenderness Extremities: bilateral Pedal edema Neurology:  alert and oriented to time, place, and person affect appropriate. no new focal deficit Gait not checked due to patient safety concerns    Data Reviewed: I have Reviewed nursing notes, Vitals, and Lab results since pt's last  encounter. Pertinent lab results CBC and BMP I have ordered test including CBC and BMP     Family Communication: Family at bedside.  Disposition: Status is: Inpatient Remains inpatient appropriate because: Need for IV antibiotics as well as IV diuresis.  Author: Berle Mull, MD 10/03/2021 8:11 PM  Please look on [email or web address] to find out who is on call.

## 2021-10-03 NOTE — Progress Notes (Signed)
Pt stated he does wear CPAP at home but doesn't want to wear CPAP for the night.

## 2021-10-03 NOTE — Plan of Care (Signed)
  Problem: Respiratory: Goal: Ability to maintain adequate ventilation will improve Outcome: Progressing Goal: Ability to maintain a clear airway will improve Outcome: Progressing   Problem: Clinical Measurements: Goal: Cardiovascular complication will be avoided Outcome: Progressing   Problem: Activity: Goal: Risk for activity intolerance will decrease Outcome: Progressing   Problem: Nutrition: Goal: Adequate nutrition will be maintained Outcome: Progressing   Problem: Coping: Goal: Level of anxiety will decrease Outcome: Progressing   Problem: Elimination: Goal: Will not experience complications related to urinary retention Outcome: Progressing   Problem: Pain Managment: Goal: General experience of comfort will improve Outcome: Progressing

## 2021-10-04 DIAGNOSIS — J189 Pneumonia, unspecified organism: Secondary | ICD-10-CM | POA: Diagnosis not present

## 2021-10-04 LAB — BASIC METABOLIC PANEL
Anion gap: 12 (ref 5–15)
BUN: 25 mg/dL — ABNORMAL HIGH (ref 8–23)
CO2: 21 mmol/L — ABNORMAL LOW (ref 22–32)
Calcium: 8.3 mg/dL — ABNORMAL LOW (ref 8.9–10.3)
Chloride: 105 mmol/L (ref 98–111)
Creatinine, Ser: 1.5 mg/dL — ABNORMAL HIGH (ref 0.61–1.24)
GFR, Estimated: 48 mL/min — ABNORMAL LOW (ref >60)
Glucose, Bld: 246 mg/dL — ABNORMAL HIGH (ref 70–99)
Potassium: 4.3 mmol/L (ref 3.5–5.1)
Sodium: 138 mmol/L (ref 135–145)

## 2021-10-04 LAB — CBC
HCT: 30.4 % — ABNORMAL LOW (ref 39.0–52.0)
Hemoglobin: 9.2 g/dL — ABNORMAL LOW (ref 13.0–17.0)
MCH: 25.9 pg — ABNORMAL LOW (ref 26.0–34.0)
MCHC: 30.3 g/dL (ref 30.0–36.0)
MCV: 85.6 fL (ref 80.0–100.0)
Platelets: 265 10*3/uL (ref 150–400)
RBC: 3.55 MIL/uL — ABNORMAL LOW (ref 4.22–5.81)
RDW: 21.1 % — ABNORMAL HIGH (ref 11.5–15.5)
WBC: 11.8 10*3/uL — ABNORMAL HIGH (ref 4.0–10.5)
nRBC: 0 % (ref 0.0–0.2)

## 2021-10-04 LAB — HEMOGLOBIN A1C
Hgb A1c MFr Bld: 10 % — ABNORMAL HIGH (ref 4.8–5.6)
Mean Plasma Glucose: 240.3 mg/dL

## 2021-10-04 LAB — GLUCOSE, CAPILLARY
Glucose-Capillary: 146 mg/dL — ABNORMAL HIGH (ref 70–99)
Glucose-Capillary: 195 mg/dL — ABNORMAL HIGH (ref 70–99)
Glucose-Capillary: 371 mg/dL — ABNORMAL HIGH (ref 70–99)
Glucose-Capillary: 169 mg/dL — ABNORMAL HIGH (ref 70–99)

## 2021-10-04 LAB — MAGNESIUM: Magnesium: 1.7 mg/dL (ref 1.7–2.4)

## 2021-10-04 MED ORDER — IPRATROPIUM-ALBUTEROL 0.5-2.5 (3) MG/3ML IN SOLN
3.0000 mL | Freq: Four times a day (QID) | RESPIRATORY_TRACT | Status: DC
  Administered 2021-10-04 – 2021-10-05 (×2): 3 mL via RESPIRATORY_TRACT
  Filled 2021-10-04 (×2): qty 3

## 2021-10-04 MED ORDER — FUROSEMIDE 10 MG/ML IJ SOLN
40.0000 mg | Freq: Once | INTRAMUSCULAR | Status: AC
  Administered 2021-10-04: 40 mg via INTRAVENOUS
  Filled 2021-10-04: qty 4

## 2021-10-04 NOTE — Progress Notes (Addendum)
Physical Therapy Treatment Patient Details Name: [name] MRN: [id number] DOB: April 10, 1945 Today's Date: 10/04/2021   History of Present Illness Pt is a 76 y.o. male with medical history significant of adrenal insufficiency; stage 3 CKD; CAD s/p stents; DM; sarcoidosis; HTN; OSA on CPAP; and hypothyroidism presenting with cough, chest pain. He was admitted from 7/9-13 for intractable back pain from lumbar radiculopathy and treated unsuccessfully with steroids and so returned from 7/19-22 for lumbar fusion that was performed on 7/21 with improvement in symptoms.  He reports that he has been progressing well but he developed cough on 7/25. Yesterday, he went to neurosurgery for f/u and his wife was concerned about PNA. On the way home the patient became acutely ill and fatigued and remained that way overnight and so she called 911.    PT Comments    Pt instructed in and performed gait training. Pt limited by fatigue, shortness of breath, and elevated HR. Pt ambulated 80 feet x 2 with rest break between trials. Progress as tolerated.   Recommendations for follow up therapy are one component of a multi-disciplinary discharge planning process, led by the attending physician.  Recommendations may be updated based on patient status, additional functional criteria and insurance authorization.  Follow Up Recommendations  Home health PT     Assistance Recommended at Discharge Intermittent Supervision/Assistance  Patient can return home with the following A little help with walking and/or transfers;A little help with bathing/dressing/bathroom;Assistance with cooking/housework;Assist for transportation   Equipment Recommendations  None recommended by PT    Recommendations for Other Services       Precautions / Restrictions Precautions Precautions: Fall;Back;Other (comment) (monitor HR and sats) Precaution Booklet Issued: No Precaution Comments: Reviewed back precautions (teach  back) Restrictions Weight Bearing Restrictions: No     Mobility  Bed Mobility Overal bed mobility: Modified Independent Bed Mobility: Supine to Sit, Sit to Supine     Supine to sit: Modified independent (Device/Increase time), HOB elevated Sit to supine: Modified independent (Device/Increase time)        Transfers Overall transfer level: Needs assistance Equipment used: Rolling walker (2 wheels) Transfers: Sit to/from Stand Sit to Stand:  (standby A)           General transfer comment: Pt required cues for safety awareness when turning to chair between gait trails due to medical lines.    Ambulation/Gait Ambulation/Gait assistance:  (standby A) Gait Distance (Feet): 160 Feet Assistive device: Rolling walker (2 wheels) Gait Pattern/deviations: Step-through pattern, Trunk flexed Gait velocity: Decreased Gait velocity interpretation: <1.31 ft/sec, indicative of household ambulator   General Gait Details: Pt ambulated 80 feet x 2 with fairly good stability and no LOB occurred. Pt with shortness of breath; SpO2 remained stable and HR increased to 140s towards end of gait. Min cues for pacing and also for safety awareness due to lines.   Stairs             Wheelchair Mobility    Modified Rankin (Stroke Patients Only)       Balance Overall balance assessment: Needs assistance Sitting-balance support: No upper extremity supported Sitting balance-Leahy Scale: Good     Standing balance support: Reliant on assistive device for balance, No upper extremity supported, Bilateral upper extremity supported Standing balance-Leahy Scale: Fair Standing balance comment: Pt able to perform sit to stands from chair without RW  Cognition Arousal/Alertness: Awake/alert Behavior During Therapy: WFL for tasks assessed/performed Overall Cognitive Status: Within Functional Limits for tasks assessed                                           Exercises      General Comments General comments (skin integrity, edema, etc.): 97% SpO2 and 98 HR at rest on RA; increased HR to 120s-140s bpm with activity      Pertinent Vitals/Pain Pain Assessment Pain Assessment: 0-10 Pain Score: 1  Pain Location: back Pain Descriptors / Indicators: Sore Pain Intervention(s): Monitored during session    Home Living                          Prior Function            PT Goals (current goals can now be found in the care plan section) Acute Rehab PT Goals Patient Stated Goal: to be able to travel more PT Goal Formulation: With patient Time For Goal Achievement: 10/09/21 Potential to Achieve Goals: Good Progress towards PT goals: Progressing toward goals    Frequency    Min 2X/week      PT Plan Current plan remains appropriate    Co-evaluation              AM-PAC PT "6 Clicks" Mobility   Outcome Measure  Help needed turning from your back to your side while in a flat bed without using bedrails?: None Help needed moving from lying on your back to sitting on the side of a flat bed without using bedrails?: None Help needed moving to and from a bed to a chair (including a wheelchair)?: A Little Help needed standing up from a chair using your arms (e.g., wheelchair or bedside chair)?: A Little Help needed to walk in hospital room?: A Little Help needed climbing 3-5 steps with a railing? : A Little 6 Click Score: 20    End of Session Equipment Utilized During Treatment:  (none) Activity Tolerance: Patient tolerated treatment well;Patient limited by fatigue Patient left: with call bell/phone within reach;with family/visitor present;in bed Nurse Communication: Mobility status (HR response) PT Visit Diagnosis: Difficulty in walking, not elsewhere classified (R26.2);Pain;Muscle weakness (generalized) (M62.81)     Time: 5093-2671 PT Time Calculation (min) (ACUTE ONLY): 18 min  Charges:  $Gait  Training: 8-22 mins                     Donna Bernard, PT   [facility] 10/04/2021, 10:35 AM

## 2021-10-04 NOTE — Progress Notes (Signed)
Progress Note Patient: [name] [id number]:[id number] DOB: 10-Mar-1945 DOA: 10/01/2021  DOS: the patient was seen and examined on 10/04/2021  Brief hospital course: Past medical history of adrenal insufficiency, CAD SP stent, type II DM, sarcoidosis, OSA on CPAP, hypothyroidism, CKD 3a, presented to the hospital with complaints of cough and chest wall pain.  Found to have left lower lobe pneumonia.  Started on IV antibiotics.  On my examination also appears to have flareup of sarcoidosis versus acute bronchiolitis.  Assessment and Plan: Left lower lobe pneumonia. Acute flareup of sarcoidosis. Possibility of acute bronchiolitis. Left parapneumonic pleural effusion Presents with complaints of cough and shortness of breath as well as fever at home. Patient actually required oxygen earlier but currently on room air at 93%. Chest x-ray showed bibasilar atelectasis. A CT PE protocol was performed due to patient's recent hospitalization which was negative for PE but did show bronchitis with left lower lobe pneumonia. There is also parapneumonic pleural effusion. Patient was started on IV ceftriaxone and azithromycin.  Currently on IV ceftriaxone and doxycycline.  Continue to follow-up on cultures. COVID-negative. We will also add steroids.  Also adding Brovana and Pulmicort.  Continue albuterol as needed. Patient was recommended to stay off of the steroids as wound healing can be an issue although patient is 7 days out of his procedure for now. Add DuoNebs  Type diabetes mellitus, uncontrolled with hyper and hypoglycemia with long-term insulin use with nephropathy Hemoglobin A1c currently pending. Blood glucose initially were very low and therefore his 70/30 was held in the morning but later on became 400 after receiving steroids. Currently on insulin 70/30 35 units 3 times daily along with sliding scale insulin Due to hyperglycemia we are tapering the prednisone rapidly.  Acute on chronic  HFpEF Echocardiogram in March shows EF of 55 to 60%. Patient on 80 mg of furosemide at home. He recently stopped taking this medication few weeks ago as his leg swelling was significantly better. After 140 mg Lasix ordered patient urinated 5 L.  We will give another 40 mg Lasix order and monitor urine output.  Lumbar radiculopathy. Recently hospitalized for lumbar fusion. Pain improving.  Continue PT OT consultation.  CAD. Currently no acute issues. Continue aspirin Plavix.  HTN. Blood pressure stable. Continue current regimen.  Hypothyroidism. Continue Synthroid.  HLD. Continue statin.  OSA on CPAP. Class I obesity Continue CPAP nightly. Body mass index is 30.65 kg/m.  Placing the pt at higher risk of poor outcomes.  CKD 3A. Baseline serum creatinine around 1.4-1.8  Currently serum creatinine 1.5  Monitor while patient receiving diuresis.  Hypokalemia. Hypomagnesemia. Replaced. Monitor.  Subjective: Breathing about the same.  Still has weakness.  No chest pain or chest tightness.  Ambulation still does not require oxygen.  Swelling in the leg is actually about the same as yesterday.  Physical Exam: Vitals:   10/03/21 2337 10/04/21 0606 10/04/21 0809 10/04/21 0850  BP: (!) 145/59 (!) 179/83 (!) 152/65   Pulse: 68 75 91 95  Resp: '16 18 18 17  '$ Temp: 98 F (36.7 C) 98.1 F (36.7 C) 98.5 F (36.9 C)   TempSrc: Oral Oral    SpO2: 97% 97% 94% 100%  Weight:      Height:       General: Appear in mild distress, no Rash; Oral Mucosa Clear, moist. no Abnormal Neck Mass Or lumps, Conjunctiva normal  Cardiovascular: S1 and S2 Present, no Murmur, Respiratory: Increased respiratory effort, Bilateral Air entry present and bilateral Crackles, bilateral  wheezes Abdomen: Bowel Sound present, Soft and no tenderness Extremities: bilateral Pedal edema Neurology: alert and oriented to time, place, and person affect appropriate. no new focal deficit Gait not checked due to  patient safety concerns   Data Reviewed: I have Reviewed nursing notes, Vitals, and Lab results since pt's last encounter. Pertinent lab results CBC and BMP I have ordered test including CBC and BMP     Family Communication: Family at bedside.  Disposition: Status is: Inpatient Remains inpatient appropriate because: Need for IV antibiotics as well as IV diuresis.  Author: Berle Mull, MD 10/04/2021 7:52 PM  Please look on [email or web address] to find out who is on call.

## 2021-10-04 NOTE — Care Management Important Message (Signed)
Important Message  Patient Details  Name: [name] MRN: [id number] Date of Birth: 06-14-1945   Medicare Important Message Given:  Yes     Orbie Pyo 10/04/2021, 2:33 PM

## 2021-10-05 DIAGNOSIS — J189 Pneumonia, unspecified organism: Secondary | ICD-10-CM | POA: Diagnosis not present

## 2021-10-05 LAB — BASIC METABOLIC PANEL
Anion gap: 7 (ref 5–15)
BUN: 22 mg/dL (ref 8–23)
CO2: 30 mmol/L (ref 22–32)
Calcium: 8.6 mg/dL — ABNORMAL LOW (ref 8.9–10.3)
Chloride: 103 mmol/L (ref 98–111)
Creatinine, Ser: 1.19 mg/dL (ref 0.61–1.24)
GFR, Estimated: >60 mL/min (ref >60)
Glucose, Bld: 62 mg/dL — ABNORMAL LOW (ref 70–99)
Potassium: 3.2 mmol/L — ABNORMAL LOW (ref 3.5–5.1)
Sodium: 140 mmol/L (ref 135–145)

## 2021-10-05 LAB — CBC
HCT: 33.2 % — ABNORMAL LOW (ref 39.0–52.0)
Hemoglobin: 10.1 g/dL — ABNORMAL LOW (ref 13.0–17.0)
MCH: 26.2 pg (ref 26.0–34.0)
MCHC: 30.4 g/dL (ref 30.0–36.0)
MCV: 86 fL (ref 80.0–100.0)
Platelets: 292 10*3/uL (ref 150–400)
RBC: 3.86 MIL/uL — ABNORMAL LOW (ref 4.22–5.81)
RDW: 21.4 % — ABNORMAL HIGH (ref 11.5–15.5)
WBC: 13.2 10*3/uL — ABNORMAL HIGH (ref 4.0–10.5)
nRBC: 0.2 % (ref 0.0–0.2)

## 2021-10-05 LAB — GLUCOSE, CAPILLARY
Glucose-Capillary: 136 mg/dL — ABNORMAL HIGH (ref 70–99)
Glucose-Capillary: 220 mg/dL — ABNORMAL HIGH (ref 70–99)
Glucose-Capillary: 160 mg/dL — ABNORMAL HIGH (ref 70–99)
Glucose-Capillary: 256 mg/dL — ABNORMAL HIGH (ref 70–99)

## 2021-10-05 LAB — MAGNESIUM: Magnesium: 1.7 mg/dL (ref 1.7–2.4)

## 2021-10-05 MED ORDER — INSULIN ASPART 100 UNIT/ML IJ SOLN
0.0000 [IU] | Freq: Every day | INTRAMUSCULAR | Status: DC
  Administered 2021-10-05: 3 [IU] via SUBCUTANEOUS

## 2021-10-05 MED ORDER — FUROSEMIDE 10 MG/ML IJ SOLN
20.0000 mg | Freq: Two times a day (BID) | INTRAMUSCULAR | Status: DC
  Administered 2021-10-05: 20 mg via INTRAVENOUS
  Filled 2021-10-05: qty 2

## 2021-10-05 MED ORDER — INSULIN ASPART PROT & ASPART (70-30 MIX) 100 UNIT/ML ~~LOC~~ SUSP
30.0000 [IU] | Freq: Three times a day (TID) | SUBCUTANEOUS | Status: DC
  Administered 2021-10-05 – 2021-10-06 (×2): 30 [IU] via SUBCUTANEOUS

## 2021-10-05 MED ORDER — INSULIN ASPART PROT & ASPART (70-30 MIX) 100 UNIT/ML ~~LOC~~ SUSP
15.0000 [IU] | Freq: Once | SUBCUTANEOUS | Status: AC
  Administered 2021-10-05: 15 [IU] via SUBCUTANEOUS
  Filled 2021-10-05: qty 10

## 2021-10-05 MED ORDER — INSULIN ASPART 100 UNIT/ML IJ SOLN
0.0000 [IU] | Freq: Three times a day (TID) | INTRAMUSCULAR | Status: DC
  Administered 2021-10-05: 3 [IU] via SUBCUTANEOUS
  Administered 2021-10-05: 5 [IU] via SUBCUTANEOUS
  Administered 2021-10-05 – 2021-10-06 (×2): 2 [IU] via SUBCUTANEOUS

## 2021-10-05 MED ORDER — IPRATROPIUM-ALBUTEROL 0.5-2.5 (3) MG/3ML IN SOLN
3.0000 mL | Freq: Two times a day (BID) | RESPIRATORY_TRACT | Status: DC
Start: 2021-10-05 — End: 2021-10-06
  Administered 2021-10-05 – 2021-10-06 (×2): 3 mL via RESPIRATORY_TRACT
  Filled 2021-10-05 (×2): qty 3

## 2021-10-05 MED ORDER — CEFDINIR 300 MG PO CAPS
300.0000 mg | ORAL_CAPSULE | Freq: Two times a day (BID) | ORAL | Status: DC
Start: 2021-10-05 — End: 2021-10-06
  Administered 2021-10-05 – 2021-10-06 (×3): 300 mg via ORAL
  Filled 2021-10-05 (×5): qty 1

## 2021-10-05 MED ORDER — INSULIN ASPART 100 UNIT/ML IJ SOLN: 0.0000 [IU] | Freq: Three times a day (TID) | INTRAMUSCULAR | Status: DC

## 2021-10-05 MED ORDER — POTASSIUM CHLORIDE CRYS ER 20 MEQ PO TBCR
40.0000 meq | EXTENDED_RELEASE_TABLET | ORAL | Status: AC
  Administered 2021-10-05 (×2): 40 meq via ORAL
  Filled 2021-10-05 (×2): qty 2

## 2021-10-05 MED ORDER — FUROSEMIDE 40 MG PO TABS
80.0000 mg | ORAL_TABLET | Freq: Every day | ORAL | Status: DC
  Administered 2021-10-06: 80 mg via ORAL
  Filled 2021-10-05: qty 2

## 2021-10-05 MED ORDER — INSULIN ASPART PROT & ASPART (70-30 MIX) 100 UNIT/ML ~~LOC~~ SUSP
25.0000 [IU] | Freq: Once | SUBCUTANEOUS | Status: AC
  Administered 2021-10-05: 25 [IU] via SUBCUTANEOUS
  Filled 2021-10-05: qty 10

## 2021-10-05 NOTE — Progress Notes (Signed)
Mobility Specialist Progress Note    10/05/21 1100  Mobility  Activity Ambulated independently in hallway  Level of Assistance Contact guard assist, steadying assist  Assistive Device Front wheel walker  Distance Ambulated (ft) 220 ft (110+110)  Activity Response Tolerated well  $Mobility charge 1 Mobility    Pre-Mobility: 93 HR, 140/68 BP, 96% SpO2 During Mobility: 135 HR Post-Mobility: 115 HR, 160/84 BP, 96% SpO2  Pt was in bed and agreeable but had c/o back soreness. Took X1 seated rest break halfway d/t fatigue + elevated HR. Returned to BR, successful BM. Left in chair with alarm on and call bell in reach.   Lucious Groves Mobility Specialist

## 2021-10-05 NOTE — Plan of Care (Signed)
  Problem: Activity: Goal: Ability to tolerate increased activity will improve Outcome: Progressing   Problem: Clinical Measurements: Goal: Ability to maintain a body temperature in the normal range will improve Outcome: Progressing   Problem: Respiratory: Goal: Ability to maintain adequate ventilation will improve Outcome: Progressing Goal: Ability to maintain a clear airway will improve Outcome: Progressing   Problem: Education: Goal: Knowledge of General Education information will improve Description: Including pain rating scale, medication(s)/side effects and non-pharmacologic comfort measures Outcome: Progressing   Problem: Health Behavior/Discharge Planning: Goal: Ability to manage health-related needs will improve Outcome: Progressing   Problem: Clinical Measurements: Goal: Ability to maintain clinical measurements within normal limits will improve Outcome: Progressing Goal: Will remain free from infection Outcome: Progressing Goal: Diagnostic test results will improve Outcome: Progressing Goal: Respiratory complications will improve Outcome: Progressing Goal: Cardiovascular complication will be avoided Outcome: Progressing   Problem: Activity: Goal: Risk for activity intolerance will decrease Outcome: Progressing   Problem: Nutrition: Goal: Adequate nutrition will be maintained Outcome: Progressing   Problem: Coping: Goal: Level of anxiety will decrease Outcome: Progressing   Problem: Elimination: Goal: Will not experience complications related to bowel motility Outcome: Progressing Goal: Will not experience complications related to urinary retention Outcome: Progressing   Problem: Pain Managment: Goal: General experience of comfort will improve Outcome: Progressing   Problem: Skin Integrity: Goal: Risk for impaired skin integrity will decrease Outcome: Progressing

## 2021-10-05 NOTE — Progress Notes (Signed)
Progress Note Patient: [name] [id number]:[id number] DOB: 01-09-1946 DOA: 10/01/2021  DOS: the patient was seen and examined on 10/05/2021  Brief hospital course: Past medical history of adrenal insufficiency, CAD SP stent, type II DM, sarcoidosis, OSA on CPAP, hypothyroidism, CKD 3a, presented to the hospital with complaints of cough and chest wall pain.  Found to have left lower lobe pneumonia.  Started on IV antibiotics.  There is also concern for sarcoidosis flareup and patient was given some steroids. Suspect suffering from acute on chronic diastolic CHF, now responding to aggressive diuresis. Assessment and Plan: Left lower lobe pneumonia. Acute flareup of sarcoidosis. Possibility of acute bronchiolitis. Left parapneumonic pleural effusion Presents with complaints of cough and shortness of breath as well as fever at home. Patient actually required oxygen earlier but currently on room air Chest x-ray showed bibasilar atelectasis. A CT PE protocol was performed due to patient's recent hospitalization which was negative for PE but did show bronchitis with left lower lobe pneumonia. There is also parapneumonic pleural effusion. Patient was started on IV ceftriaxone and azithromycin.  Later on IV ceftriaxone and doxycycline.  Cultures so far negative.  Continuing Omnicef and doxycycline. COVID-negative. Patient is on chronic steroids, patient was given higher dose of steroids but tapered off rapidly due to hyperglycemia. Also adding Brovana and Pulmicort.  And DuoNebs. Patient was recommended to stay off of the steroids as wound healing can be an issue although patient is 7 days out of his procedure for now.  Type 2 diabetes mellitus, uncontrolled with hyper and hypoglycemia with long-term insulin use with nephropathy Hemoglobin A1c 10.0. Blood glucose initially were very low but later on were high due to steroids. Currently on insulin 70/30 35 units 3 times daily along with sliding scale  insulin Due to hyperglycemia we are tapering the prednisone rapidly.   Acute on chronic HFpEF Echocardiogram in March shows EF of 55 to 60%. Patient on 80 mg of furosemide at home. He recently stopped taking this medication few weeks ago as his leg swelling was significantly better. Responding well to to IV diuresis.  We will continue for now and switch to p.o. Lasix tomorrow.  Lumbar radiculopathy. Recently hospitalized for lumbar fusion. Pain improving.  Continue PT OT consultation.  CAD. Currently no acute issues. Continue aspirin Plavix.  HTN. Blood pressure stable. Continue current regimen.  Hypothyroidism. Continue Synthroid.  HLD. Continue statin.  OSA on CPAP. Class I obesity Continue CPAP nightly. Body mass index is 30.65 kg/m.  Placing the pt at higher risk of poor outcomes.   CKD 3A. Baseline serum creatinine around 1.4-1.8  Currently serum creatinine 1.5  Possible cardiorenal hemodynamics as the patient is actually responding positively with normal creatinine after receiving IV diuresis. Monitor   Hypokalemia. Hypomagnesemia. Replaced. Monitor.  Subjective: Breathing improving better nausea no vomiting no fever no chills.  Still has swelling in the leg.  Physical Exam: Vitals:   10/05/21 0510 10/05/21 0720 10/05/21 0825 10/05/21 1331  BP: (!) 131/51  (!) 138/57 135/74  Pulse: 71 73 90 96  Resp: '18  19 18  '$ Temp:   98 F (36.7 C) 98.3 F (36.8 C)  TempSrc:   Oral Oral  SpO2: 98% 99% 95% 95%  Weight:      Height:       General: Appear in mild distress; no visible Abnormal Neck Mass Or lumps, Conjunctiva normal Cardiovascular: S1 and S2 Present, no Murmur, Respiratory: good respiratory effort, Bilateral Air entry present and bilateral  Crackles, no  wheezes Abdomen: Bowel Sound present, Non tender  Extremities: bilateral improving pedal edema Neurology: alert and oriented to time, place, and person  Gait not checked due to patient safety  concerns   Data Reviewed: I have Reviewed nursing notes, Vitals, and Lab results since pt's last encounter. Pertinent lab results CBC BMP and magnesium I have ordered test including CBC BMP and magnesium    Family Communication: Wife at bedside  Disposition: Status is: Inpatient Remains inpatient appropriate because: Needing IV diuresis.  Potentially can go home as long as renal function remained stable.  Author: Berle Mull, MD 10/05/2021 8:15 PM  Please look on [email or web address] to find out who is on call.

## 2021-10-06 DIAGNOSIS — J181 Lobar pneumonia, unspecified organism: Secondary | ICD-10-CM

## 2021-10-06 LAB — BASIC METABOLIC PANEL
Anion gap: 7 (ref 5–15)
BUN: 20 mg/dL (ref 8–23)
CO2: 29 mmol/L (ref 22–32)
Calcium: 8.5 mg/dL — ABNORMAL LOW (ref 8.9–10.3)
Chloride: 104 mmol/L (ref 98–111)
Creatinine, Ser: 1.16 mg/dL (ref 0.61–1.24)
GFR, Estimated: >60 mL/min (ref >60)
Glucose, Bld: 179 mg/dL — ABNORMAL HIGH (ref 70–99)
Potassium: 3.7 mmol/L (ref 3.5–5.1)
Sodium: 140 mmol/L (ref 135–145)

## 2021-10-06 LAB — CULTURE, BLOOD (ROUTINE X 2)
Culture: NO GROWTH
Culture: NO GROWTH
Special Requests: ADEQUATE

## 2021-10-06 LAB — CBC
HCT: 32.1 % — ABNORMAL LOW (ref 39.0–52.0)
Hemoglobin: 9.7 g/dL — ABNORMAL LOW (ref 13.0–17.0)
MCH: 26.4 pg (ref 26.0–34.0)
MCHC: 30.2 g/dL (ref 30.0–36.0)
MCV: 87.5 fL (ref 80.0–100.0)
Platelets: 292 10*3/uL (ref 150–400)
RBC: 3.67 MIL/uL — ABNORMAL LOW (ref 4.22–5.81)
RDW: 21.2 % — ABNORMAL HIGH (ref 11.5–15.5)
WBC: 13.4 10*3/uL — ABNORMAL HIGH (ref 4.0–10.5)
nRBC: 0 % (ref 0.0–0.2)

## 2021-10-06 LAB — GLUCOSE, CAPILLARY: Glucose-Capillary: 140 mg/dL — ABNORMAL HIGH (ref 70–99)

## 2021-10-06 LAB — MAGNESIUM: Magnesium: 1.7 mg/dL (ref 1.7–2.4)

## 2021-10-06 MED ORDER — DOXYCYCLINE HYCLATE 100 MG PO TABS: 100.0000 mg | ORAL_TABLET | Freq: Two times a day (BID) | ORAL | 0 refills | Status: AC

## 2021-10-06 MED ORDER — BENZONATATE 100 MG PO CAPS: 100.0000 mg | ORAL_CAPSULE | Freq: Three times a day (TID) | ORAL | 0 refills | Status: AC | PRN

## 2021-10-06 MED ORDER — CEFDINIR 300 MG PO CAPS: 300.0000 mg | ORAL_CAPSULE | Freq: Two times a day (BID) | ORAL | 0 refills | Status: AC

## 2021-10-06 NOTE — Discharge Instructions (Signed)
[name],  You are in the hospital with left-sided upper pneumonia.  This was treated with antibiotics.  Thankfully, you have improved significantly and will discharge to complete your antibiotic course.  Please follow-up with your primary care physician.  The therapist have recommended home health physical therapy for you.

## 2021-10-06 NOTE — Discharge Summary (Signed)
Physician Discharge Summary   Patient: [name] MRN: [id number] DOB: 01-13-1946  Admit date:     10/01/2021  Discharge date: {dischdate:26783}  Discharge Physician: Cordelia Poche   PCP: Shirline Frees, MD   Recommendations at discharge:  {Tip this will not be part of the note when signed- Example include specific recommendations for outpatient follow-up, pending tests to follow-up on. (Optional):26781}  ***  Discharge Diagnoses: Principal Problem:   CAP (community acquired pneumonia) Active Problems:   Sarcoidosis   OSA (obstructive sleep apnea)   Coronary atherosclerosis of native coronary artery   Uncontrolled type 2 diabetes mellitus with hyperglycemia, with long-term current use of insulin (HCC)   Hyperlipidemia   Stage 3a chronic kidney disease (CKD) (HCC)   Hypothyroidism   Herniated lumbar intervertebral disc  Resolved Problems:   * No resolved hospital problems. [facility] Course: Past medical history of adrenal insufficiency, CAD SP stent, type II DM, sarcoidosis, OSA on CPAP, hypothyroidism, CKD 3a, presented to the hospital with complaints of cough and chest wall pain.  Found to have left lower lobe pneumonia.  Started on IV antibiotics.  There is also concern for sarcoidosis flareup and patient was given some steroids. Suspect suffering from acute on chronic diastolic CHF, now responding to aggressive diuresis.  Assessment and Plan: No notes have been filed under this hospital service. Service: Hospitalist     {Tip this will not be part of the note when signed Body mass index is 30.65 kg/m. , ,  (Optional):26781}  {(NOTE) Pain control PDMP Statment (Optional):26782} Consultants: *** Procedures performed: ***  Disposition: {Plan; Disposition:26390} Diet recommendation:  {Diet_Plan:26776} DISCHARGE MEDICATION: Allergies as of 10/06/2021       Reactions   Statins Other (See Comments)   Pt says they make him "mean"   Hydrocortisone Nausea Only   Ranexa  [ranolazine] Other (See Comments)   Severe weakness/near syncope after 1 dose Hallucinations    Hydrocodone Nausea And Vomiting   Miralax [polyethylene Glycol] Nausea And Vomiting   Z-pak [azithromycin] Other (See Comments)   Raises blood sugar   Zaroxolyn [metolazone] Other (See Comments)   Drained, no energy        Medication List     TAKE these medications    acetaminophen 500 MG tablet Commonly known as: TYLENOL Take 1,000 mg by mouth daily as needed for mild pain (pain).   Albuterol Sulfate 108 (90 Base) MCG/ACT Aepb Commonly known as: ProAir RespiClick Inhale 2 puffs into the lungs every 6 (six) hours as needed. What changed:  when to take this reasons to take this   albuterol (2.5 MG/3ML) 0.083% nebulizer solution Commonly known as: PROVENTIL Take 3 mLs (2.5 mg total) by nebulization every 6 (six) hours as needed for wheezing or shortness of breath. Dx Code D86.0 What changed: Another medication with the same name was changed. Make sure you understand how and when to take each.   aspirin EC 81 MG tablet Take 81 mg by mouth every evening.   benzonatate 100 MG capsule Commonly known as: TESSALON Take 1 capsule (100 mg total) by mouth 3 (three) times daily as needed for up to 5 days for cough.   cefdinir 300 MG capsule Commonly known as: OMNICEF Take 1 capsule (300 mg total) by mouth 2 (two) times daily for 2 days.   clopidogrel 75 MG tablet Commonly known as: PLAVIX Take 1 tablet (75 mg total) by mouth daily. Restart on 09/28/21   CoQ-10 400 MG Caps Take 400 mg  by mouth 2 (two) times a week. 'Sunday, Wednesday   doxycycline 100 MG tablet Commonly known as: VIBRA-TABS Take 1 tablet (100 mg total) by mouth 2 (two) times daily for 2 days.   ezetimibe 10 MG tablet Commonly known as: ZETIA Take 10 mg by mouth once a week.   furosemide 80 MG tablet Commonly known as: LASIX Take 1 tablet (80 mg total) by mouth daily as needed for edema. What changed: when to  take this   gabapentin 100 MG capsule Commonly known as: NEURONTIN Take 1 capsule (100 mg total) by mouth 3 (three) times daily. What changed:  when to take this reasons to take this   hydrALAZINE 10 MG tablet Commonly known as: APRESOLINE Take 1 tablet (10 mg total) by mouth every 8 (eight) hours. What changed: when to take this   insulin aspart protamine- aspart (70-30) 100 UNIT/ML injection Commonly known as: NOVOLOG MIX 70/30 Inject 0.4 mLs (40 Units total) into the skin with breakfast, with lunch, and with evening meal. What changed: how much to take   levothyroxine 75 MCG tablet Commonly known as: SYNTHROID Take 75 mcg by mouth daily.   lidocaine 5 % Commonly known as: LIDODERM Place 1 patch onto the skin daily as needed for pain.   montelukast 10 MG tablet Commonly known as: SINGULAIR Take 10 mg by mouth daily as needed (allergies).   multivitamin with minerals Tabs tablet Take 1 tablet by mouth daily.   nitroGLYCERIN 0.4 MG SL tablet Commonly known as: NITROSTAT Place 1 tablet (0.4 mg total) under the tongue every 5 (five) minutes as needed for chest pain.   OVER THE COUNTER MEDICATION Take 1 tablet by mouth daily. Omega XL   oxyCODONE 5 MG immediate release tablet Commonly known as: Oxy IR/ROXICODONE Take 1 tablet (5 mg total) by mouth every 4 (four) hours as needed for severe pain or breakthrough pain.   potassium chloride 10 MEQ tablet Commonly known as: KLOR-CON M Take 10 mEq by mouth daily.   predniSONE 10 MG tablet Commonly known as: DELTASONE Take 10 mg by mouth daily.   RABEprazole 20 MG tablet Commonly known as: ACIPHEX Take 20 mg by mouth daily.   rosuvastatin 20 MG tablet Commonly known as: CRESTOR Take 20 mg by mouth daily.   Vitamin D3 75 MCG (3000 UT) Tabs Take 3,000 Units by mouth daily.               Durable Medical Equipment  (From admission, onward)           Start     Ordered   10/05/21 1005  For home use only  DME continuous positive airway pressure (CPAP)  Once       Question Answer Comment  Length of Need Lifetime   Patient has OSA or probable OSA Yes   Is the patient currently using CPAP in the home Yes   Settings Autotitration   CPAP supplies needed Mask, headgear, cushions, filters, heated tubing and water chamber      08'$ /01/23 1005            Follow-up Information     [facility] Follow up.   Specialty: [place] Why: someone from the office will call to schedule home health physical therapy visits Contact information: [address] [place] [address] [phone]         Shirline Frees, MD. Schedule an appointment as soon as possible for a visit in 1  week(s).   Specialty: Family Medicine Why: For hospital follow-up Contact information: [place] [address] [phone]                Discharge Exam: Danley Danker Weights   10/01/21 1458  Weight: 96.9 kg   ***  Condition at discharge: {DC Condition:26389}  The results of significant diagnostics from this hospitalization (including imaging, microbiology, ancillary and laboratory) are listed below for reference.   Imaging Studies: VAS [place] LOWER EXTREMITY VENOUS (DVT) (ONLY [facility] & [facility])  Result Date: 10/01/2021  Lower Venous DVT Study Patient Name:  ZAIM NITTA  Date of Exam:   10/01/2021 Medical Rec #: [id number]   Accession #:    [id number] Date of Birth: 04/17/1945   Patient Gender: M Patient Age:   37 years Exam Location:  [facility] Procedure:      VAS [place] LOWER EXTREMITY VENOUS (DVT) Referring Phys: Benjamine Mola REES --------------------------------------------------------------------------------  Indications: Edema.  Comparison Study: No previous exam noted. Performing Technologist: Bobetta Lime BS, RVT  Examination Guidelines: A complete evaluation includes B-mode imaging, spectral Doppler, color Doppler, and power Doppler as needed of all accessible portions  of each vessel. Bilateral testing is considered an integral part of a complete examination. Limited examinations for reoccurring indications may be performed as noted. The reflux portion of the exam is performed with the patient in reverse Trendelenburg.  +-----+---------------+---------+-----------+----------+--------------+ RIGHTCompressibilityPhasicitySpontaneityPropertiesThrombus Aging +-----+---------------+---------+-----------+----------+--------------+ CFV  Full           Yes      Yes                                 +-----+---------------+---------+-----------+----------+--------------+   +---------+---------------+---------+-----------+----------+--------------+ LEFT     CompressibilityPhasicitySpontaneityPropertiesThrombus Aging +---------+---------------+---------+-----------+----------+--------------+ CFV      Full           Yes      Yes                                 +---------+---------------+---------+-----------+----------+--------------+ SFJ      Full                                                        +---------+---------------+---------+-----------+----------+--------------+ FV Prox  Full                                                        +---------+---------------+---------+-----------+----------+--------------+ FV Mid   Full                                                        +---------+---------------+---------+-----------+----------+--------------+ FV DistalFull                                                        +---------+---------------+---------+-----------+----------+--------------+  PFV      Full                                                        +---------+---------------+---------+-----------+----------+--------------+ POP      Full           Yes      Yes                                 +---------+---------------+---------+-----------+----------+--------------+ PTV      Full                                                         +---------+---------------+---------+-----------+----------+--------------+ PERO     Full                                                        +---------+---------------+---------+-----------+----------+--------------+     Summary: RIGHT: - No evidence of common femoral vein obstruction.  LEFT: - No evidence of deep vein thrombosis in the lower extremity. No indirect evidence of obstruction proximal to the inguinal ligament. - No cystic structure found in the popliteal fossa.  *See table(s) above for measurements and observations. Electronically signed by Jamelle Haring on 10/01/2021 at 2:09:10 PM.    Final    CT Angio Chest PE W/Cm &/Or Wo Cm  Result Date: 10/01/2021 CLINICAL DATA:  76 year old male with suspected pulmonary embolism. EXAM: CT ANGIOGRAPHY CHEST WITH CONTRAST TECHNIQUE: Multidetector CT imaging of the chest was performed using the standard protocol during bolus administration of intravenous contrast. Multiplanar CT image reconstructions and MIPs were obtained to evaluate the vascular anatomy. RADIATION DOSE REDUCTION: This exam was performed according to the departmental dose-optimization program which includes automated exposure control, adjustment of the mA and/or kV according to patient size and/or use of iterative reconstruction technique. CONTRAST:  25m OMNIPAQUE IOHEXOL 350 MG/ML SOLN COMPARISON:  Chest CT 05/06/2019. FINDINGS: Cardiovascular: No filling defects within the pulmonary arterial tree to suggest pulmonary embolism. Heart size is normal. There is no significant pericardial fluid, thickening or pericardial calcification. There is aortic atherosclerosis, as well as atherosclerosis of the great vessels of the mediastinum and the coronary arteries, including calcified atherosclerotic plaque in the left anterior descending, left circumflex and right coronary arteries. Mediastinum/Nodes: No pathologically enlarged mediastinal or hilar lymph  nodes. Esophagus is unremarkable in appearance. No axillary lymphadenopathy. Lungs/Pleura: Extensive bronchial wall thickening noted throughout the lungs bilaterally. There is also patchy thickening of the peribronchovascular interstitium and extensive peribronchovascular airspace consolidation most severe in the left lower lobe. Small left pleural effusion in the base of the left hemithorax. No right pleural effusion. Upper Abdomen: Aortic atherosclerosis. Status post cholecystectomy. Liver has a shrunken appearance and nodular contour, suggesting underlying cirrhosis. Musculoskeletal: There are no aggressive appearing lytic or blastic lesions noted in the visualized portions of the skeleton. Review of the MIP images confirms the above findings. IMPRESSION: 1. No evidence of pulmonary  embolism. 2. Bronchitis with left lower lobe bronchopneumonia. 3. Small parapneumonic pleural effusion in the base of the left hemithorax. 4. Aortic atherosclerosis, in addition to three-vessel coronary artery disease. Please note that although the presence of coronary artery calcium documents the presence of coronary artery disease, the severity of this disease and any potential stenosis cannot be assessed on this non-gated CT examination. Assessment for potential risk factor modification, dietary therapy or pharmacologic therapy may be warranted, if clinically indicated. Aortic Atherosclerosis (ICD10-I70.0). Electronically Signed   By: Vinnie Langton M.D.   On: 10/01/2021 07:30   DG Chest Port 1 View  Result Date: 10/01/2021 CLINICAL DATA:  Chest pain and cough. EXAM: PORTABLE CHEST 1 VIEW COMPARISON:  August 28, 2019 FINDINGS: The heart size and mediastinal contours are within normal limits. Mild to moderate severity areas of bibasilar atelectasis and/or infiltrate are noted. There is no evidence of a pleural effusion or pneumothorax. The visualized skeletal structures are unremarkable. IMPRESSION: Mild to moderate severity  bibasilar atelectasis and/or infiltrate. Electronically Signed   By: Virgina Norfolk M.D.   On: 10/01/2021 04:27   DG Lumbar Spine 2-3 Views  Result Date: 09/25/2021 CLINICAL DATA:  Lumbar spine surgery yesterday. EXAM: LUMBAR SPINE - 2-3 VIEW COMPARISON:  MRI lumbar spine without contrast 09/13/2021 FINDINGS: Bilateral pedicle screw and rod fixation is noted. Discectomy and fusion noted. Atherosclerotic calcifications are present in the aorta. IMPRESSION: Status post lumbar fusion without radiographic evidence for complication. Electronically Signed   By: San Morelle M.D.   On: 09/25/2021 09:04   DG Spine Portable 1 View  Result Date: 09/24/2021 CLINICAL DATA:  Spine surgery EXAM: PORTABLE SPINE - 1 VIEW COMPARISON:  MRI 09/13/2021 FINDINGS: Single lateral view of the lumbar spine obtained intraoperatively. Posterior lumbar fusion hardware with interbody device at L4 and L5. Linear instrument overlies the S2 segment. IMPRESSION: Limited lateral intraoperative view of the lumbar spine. Electronically Signed   By: Donavan Foil M.D.   On: 09/24/2021 21:32   DG O-ARM IMAGE ONLY/NO REPORT  Result Date: 09/24/2021 There is no Radiologist interpretation  for this exam.  DG C-Arm 1-60 Min-No Report  Result Date: 09/24/2021 Fluoroscopy was utilized by the requesting physician.  No radiographic interpretation.   MR LUMBAR SPINE WO CONTRAST  Result Date: 09/13/2021 CLINICAL DATA:  Lumbar radiculopathy, symptoms persist with > 6 wks treatment. EXAM: MRI LUMBAR SPINE WITHOUT CONTRAST TECHNIQUE: Multiplanar, multisequence MR imaging of the lumbar spine was performed. No intravenous contrast was administered. COMPARISON:  CT lumbar spine September 13, 2021. FINDINGS: Segmentation:  Standard. Alignment: Grade 1 anterolisthesis of L4 over L5 related to facet arthropathy. Vertebrae:  No fracture, evidence of discitis, or bone lesion.  L1-2 Conus medullaris and cauda equina: Conus extends to the L1-2 level.  Conus and cauda equina appear normal. Paraspinal and other soft tissues: Negative. Disc levels: T12-L1: No spinal canal or foraminal stenosis. L1-2: No spinal canal neural foraminal stenosis. L2-3: Mild facet degenerative changes. No spinal canal or neural foraminal stenosis. L3-4: Disc bulge asymmetric to the left and moderate facet degenerative changes with mild bilateral joint effusion resulting in mild-to-moderate left neural foraminal narrowing. L4-5: Disc bulge/disc uncovering with superimposed left foraminal disc extrusion, moderate facet degenerative changes with bilateral joint effusion and ligamentum flavum redundancy. Findings result in mild-to-moderate spinal canal stenosis with narrowing of the bilateral subarticular zones, severe bilateral neural foraminal narrowing, greater on the right side. L5-S1: Mild facet degenerative changes. No spinal canal or neural foraminal stenosis. IMPRESSION: Anterolisthesis, disc  bulge and right foraminal disc extrusion at L4-5 resulting in mild to moderate spinal canal stenosis and severe bilateral neural foraminal narrowing, greater on the right side, with impingement of the bilateral L4 nerve roots. Electronically Signed   By: Pedro Earls M.D.   On: 09/13/2021 15:04   CT Thoracic Spine Wo Contrast  Result Date: 09/13/2021 CLINICAL DATA:  Mid-back pain back pain status post twisting mechanism; back pain status post twisting mechanism. EXAM: CT THORACIC AND LUMBAR SPINE WITHOUT CONTRAST TECHNIQUE: Multidetector CT imaging of the thoracic and lumbar spine was performed without contrast. Multiplanar CT image reconstructions were also generated. RADIATION DOSE REDUCTION: This exam was performed according to the departmental dose-optimization program which includes automated exposure control, adjustment of the mA and/or kV according to patient size and/or use of iterative reconstruction technique. COMPARISON:  None Available. FINDINGS: CT THORACIC  SPINE FINDINGS Alignment: Normal. Vertebrae: No acute fracture or focal pathologic process. Paraspinal and other soft tissues: Calcific aortic atherosclerosis. Disc levels: No spinal canal stenosis. CT LUMBAR SPINE FINDINGS Segmentation: 5 lumbar type vertebrae. Alignment: Grade 1 anterolisthesis at L4-5 Vertebrae: No acute fracture or focal pathologic process. Paraspinal and other soft tissues: Negative Disc levels: Moderate bilateral L4 neural foraminal stenosis. IMPRESSION: 1. No acute fracture or traumatic subluxation of the thoracic or lumbar spine. 2. Grade 1 anterolisthesis at L4-5 with moderate bilateral neural foraminal stenosis. 3. Aortic Atherosclerosis (ICD10-I70.0). Electronically Signed   By: Ulyses Jarred M.D.   On: 09/13/2021 02:49   CT Lumbar Spine Wo Contrast  Result Date: 09/13/2021 CLINICAL DATA:  Mid-back pain back pain status post twisting mechanism; back pain status post twisting mechanism. EXAM: CT THORACIC AND LUMBAR SPINE WITHOUT CONTRAST TECHNIQUE: Multidetector CT imaging of the thoracic and lumbar spine was performed without contrast. Multiplanar CT image reconstructions were also generated. RADIATION DOSE REDUCTION: This exam was performed according to the departmental dose-optimization program which includes automated exposure control, adjustment of the mA and/or kV according to patient size and/or use of iterative reconstruction technique. COMPARISON:  None Available. FINDINGS: CT THORACIC SPINE FINDINGS Alignment: Normal. Vertebrae: No acute fracture or focal pathologic process. Paraspinal and other soft tissues: Calcific aortic atherosclerosis. Disc levels: No spinal canal stenosis. CT LUMBAR SPINE FINDINGS Segmentation: 5 lumbar type vertebrae. Alignment: Grade 1 anterolisthesis at L4-5 Vertebrae: No acute fracture or focal pathologic process. Paraspinal and other soft tissues: Negative Disc levels: Moderate bilateral L4 neural foraminal stenosis. IMPRESSION: 1. No acute  fracture or traumatic subluxation of the thoracic or lumbar spine. 2. Grade 1 anterolisthesis at L4-5 with moderate bilateral neural foraminal stenosis. 3. Aortic Atherosclerosis (ICD10-I70.0). Electronically Signed   By: Ulyses Jarred M.D.   On: 09/13/2021 02:49    Microbiology: Results for orders placed or performed during the hospital encounter of 10/01/21  Blood Culture (routine x 2)     Status: None   Collection Time: 10/01/21  3:58 AM   Specimen: BLOOD  Result Value Ref Range Status   Specimen Description BLOOD LEFT  Final   Special Requests   Final    BOTTLES DRAWN AEROBIC AND ANAEROBIC Blood Culture adequate volume   Culture   Final    NO GROWTH 5 DAYS Performed at [facility], [address] [place], [place] [address]    Report Status 10/06/2021 FINAL  Final  Blood Culture (routine x 2)     Status: None   Collection Time: 10/01/21  4:15 AM   Specimen: BLOOD  Result Value Ref Range  Status   Specimen Description BLOOD SITE NOT SPECIFIED  Final   Special Requests   Final    BOTTLES DRAWN AEROBIC ONLY Blood Culture results may not be optimal due to an inadequate volume of blood received in culture bottles   Culture   Final    NO GROWTH 5 DAYS Performed at [facility], [place] [address], [place], [place] [address]    Report Status 10/06/2021 FINAL  Final  Urine Culture     Status: Abnormal   Collection Time: 10/01/21  6:50 AM   Specimen: In/Out Cath Urine  Result Value Ref Range Status   Specimen Description IN/OUT CATH URINE  Final   Special Requests   Final    NONE Performed at [facility], [place] [address] [place], [place] [address]    Culture MULTIPLE SPECIES PRESENT, SUGGEST RECOLLECTION (A)  Final   Report Status 10/02/2021 FINAL  Final  Resp Panel by RT-PCR (Flu A&B, Covid) Urine, Clean Catch     Status: None   Collection Time: 10/02/21  5:51 PM   Specimen: Urine, Clean Catch; Nasal Swab  Result Value Ref Range Status   SARS Coronavirus 2 by RT  PCR NEGATIVE NEGATIVE Final    Comment: (NOTE) SARS-CoV-2 target nucleic acids are NOT DETECTED.  The SARS-CoV-2 RNA is generally detectable in upper respiratory specimens during the acute phase of infection. The lowest concentration of SARS-CoV-2 viral copies this assay can detect is 138 copies/mL. A negative result does not preclude SARS-Cov-2 infection and should not be used as the sole basis for treatment or other patient management decisions. A negative result may occur with  improper specimen collection/handling, submission of specimen other than nasopharyngeal swab, presence of viral mutation(s) within the areas targeted by this assay, and inadequate number of viral copies(<138 copies/mL). A negative result must be combined with clinical observations, patient history, and epidemiological information. The expected result is Negative.  Fact Sheet for Patients:  EntrepreneurPulse.com.au  Fact Sheet for Healthcare Providers:  IncredibleEmployment.be  This test is no t yet approved or cleared by the [place] FDA and  has been authorized for detection and/or diagnosis of SARS-CoV-2 by FDA under an Emergency Use Authorization (EUA). This EUA will remain  in effect (meaning this test can be used) for the duration of the COVID-19 declaration under Section 564(b)(1) of the Act, 21 U.S.C.section 360bbb-3(b)(1), unless the authorization is terminated  or revoked sooner.       Influenza A by PCR NEGATIVE NEGATIVE Final   Influenza B by PCR NEGATIVE NEGATIVE Final    Comment: (NOTE) The Xpert Xpress SARS-CoV-2/FLU/RSV plus assay is intended as an aid in the diagnosis of influenza from Nasopharyngeal swab specimens and should not be used as a sole basis for treatment. Nasal washings and aspirates are unacceptable for Xpert Xpress SARS-CoV-2/FLU/RSV testing.  Fact Sheet for Patients: EntrepreneurPulse.com.au  Fact Sheet for  Healthcare Providers: IncredibleEmployment.be  This test is not yet approved or cleared by the [place] FDA and has been authorized for detection and/or diagnosis of SARS-CoV-2 by FDA under an Emergency Use Authorization (EUA). This EUA will remain in effect (meaning this test can be used) for the duration of the COVID-19 declaration under Section 564(b)(1) of the Act, 21 U.S.C. section 360bbb-3(b)(1), unless the authorization is terminated or revoked.  Performed at [facility], [place] [address], [place], [place] [address]   SARS Coronavirus 2 by RT PCR (hospital order, performed in [facility] hospital lab) *  cepheid single result test* Anterior Nasal Swab     Status: None   Collection Time: 10/02/21  5:51 PM   Specimen: Anterior Nasal Swab  Result Value Ref Range Status   SARS Coronavirus 2 by RT PCR NEGATIVE NEGATIVE Final    Comment: (NOTE) SARS-CoV-2 target nucleic acids are NOT DETECTED.  The SARS-CoV-2 RNA is generally detectable in upper and lower respiratory specimens during the acute phase of infection. The lowest concentration of SARS-CoV-2 viral copies this assay can detect is 250 copies / mL. A negative result does not preclude SARS-CoV-2 infection and should not be used as the sole basis for treatment or other patient management decisions.  A negative result may occur with improper specimen collection / handling, submission of specimen other than nasopharyngeal swab, presence of viral mutation(s) within the areas targeted by this assay, and inadequate number of viral copies (<250 copies / mL). A negative result must be combined with clinical observations, patient history, and epidemiological information.  Fact Sheet for Patients:   https://www.patel.info/  Fact Sheet for Healthcare Providers: https://hall.com/  This test is not yet approved or  cleared by the [place] FDA and has been  authorized for detection and/or diagnosis of SARS-CoV-2 by FDA under an Emergency Use Authorization (EUA).  This EUA will remain in effect (meaning this test can be used) for the duration of the COVID-19 declaration under Section 564(b)(1) of the Act, 21 U.S.C. section 360bbb-3(b)(1), unless the authorization is terminated or revoked sooner.  Performed at [facility], [place] [address], [place], [place] [address]     Labs: CBC: Recent Labs  Lab 10/01/21 0455 10/02/21 0140 10/03/21 0830 10/04/21 0640 10/05/21 0553 10/06/21 0522  WBC 17.2* 11.3* 13.2* 11.8* 13.2* 13.4*  NEUTROABS 14.5*  --   --   --   --   --   HGB 9.8* 9.3* 10.2* 9.2* 10.1* 9.7*  HCT 33.7* 30.4* 34.9* 30.4* 33.2* 32.1*  MCV 88.9 86.6 89.0 85.6 86.0 87.5  PLT 250 264 271 265 292 417   Basic Metabolic Panel: Recent Labs  Lab 10/02/21 0140 10/03/21 0204 10/03/21 0830 10/04/21 0141 10/05/21 0553 10/06/21 0522  NA 136  --  136 138 140 140  K 4.1  --  3.8 4.3 3.2* 3.7  CL 106  --  106 105 103 104  CO2 22  --  21* 21* 30 29  GLUCOSE 219* 480* 314* 246* 62* 179*  BUN 23  --  20 25* 22 20  CREATININE 1.53*  --  1.53* 1.50* 1.19 1.16  CALCIUM 7.9*  --  8.5* 8.3* 8.6* 8.5*  MG  --   --  1.7 1.7 1.7 1.7   Liver Function Tests: Recent Labs  Lab 10/01/21 0455  AST 21  ALT 19  ALKPHOS 49  BILITOT 0.8  PROT 4.9*  ALBUMIN 2.3*   CBG: Recent Labs  Lab 10/05/21 0810 10/05/21 1111 10/05/21 1607 10/05/21 2048 10/06/21 0739  GLUCAP 136* 220* 160* 256* 140*    Discharge time spent: {LESS THAN/GREATER THAN:26388} 30 minutes.  Signed: Cordelia Poche, MD [facility] 10/06/2021

## 2021-10-07 DIAGNOSIS — J181 Lobar pneumonia, unspecified organism: Secondary | ICD-10-CM

## 2021-10-13 DIAGNOSIS — D860 Sarcoidosis of lung: Secondary | ICD-10-CM | POA: Diagnosis not present

## 2021-10-13 DIAGNOSIS — J180 Bronchopneumonia, unspecified organism: Secondary | ICD-10-CM | POA: Diagnosis not present

## 2021-10-13 DIAGNOSIS — I5032 Chronic diastolic (congestive) heart failure: Secondary | ICD-10-CM | POA: Diagnosis not present

## 2021-10-13 DIAGNOSIS — Z09 Encounter for follow-up examination after completed treatment for conditions other than malignant neoplasm: Secondary | ICD-10-CM | POA: Diagnosis not present

## 2021-10-19 ENCOUNTER — Emergency Department (HOSPITAL_COMMUNITY): Payer: Medicare HMO

## 2021-10-19 ENCOUNTER — Other Ambulatory Visit: Payer: Self-pay

## 2021-10-19 ENCOUNTER — Encounter (HOSPITAL_COMMUNITY): Payer: Self-pay

## 2021-10-19 ENCOUNTER — Inpatient Hospital Stay (HOSPITAL_COMMUNITY)
Admission: EM | Admit: 2021-10-19 | Discharge: 2021-10-21 | DRG: 682 | Disposition: A | Discharge status: Home / Self Care | Payer: Medicare HMO | Attending: Internal Medicine | Admitting: Internal Medicine

## 2021-10-19 DIAGNOSIS — E669 Obesity, unspecified: Secondary | ICD-10-CM | POA: Diagnosis present

## 2021-10-19 DIAGNOSIS — Z6828 Body mass index (BMI) 28.0-28.9, adult: Secondary | ICD-10-CM | POA: Diagnosis not present

## 2021-10-19 DIAGNOSIS — I25118 Atherosclerotic heart disease of native coronary artery with other forms of angina pectoris: Secondary | ICD-10-CM | POA: Diagnosis not present

## 2021-10-19 DIAGNOSIS — R0609 Other forms of dyspnea: Secondary | ICD-10-CM

## 2021-10-19 DIAGNOSIS — E2740 Unspecified adrenocortical insufficiency: Secondary | ICD-10-CM | POA: Diagnosis not present

## 2021-10-19 DIAGNOSIS — I252 Old myocardial infarction: Secondary | ICD-10-CM | POA: Diagnosis not present

## 2021-10-19 DIAGNOSIS — I10 Essential (primary) hypertension: Secondary | ICD-10-CM

## 2021-10-19 DIAGNOSIS — N179 Acute kidney failure, unspecified: Principal | ICD-10-CM | POA: Diagnosis present

## 2021-10-19 DIAGNOSIS — Z833 Family history of diabetes mellitus: Secondary | ICD-10-CM

## 2021-10-19 DIAGNOSIS — I130 Hypertensive heart and chronic kidney disease with heart failure and stage 1 through stage 4 chronic kidney disease, or unspecified chronic kidney disease: Secondary | ICD-10-CM | POA: Diagnosis present

## 2021-10-19 DIAGNOSIS — M19071 Primary osteoarthritis, right ankle and foot: Secondary | ICD-10-CM | POA: Diagnosis present

## 2021-10-19 DIAGNOSIS — E876 Hypokalemia: Secondary | ICD-10-CM | POA: Diagnosis present

## 2021-10-19 DIAGNOSIS — N1831 Chronic kidney disease, stage 3a: Secondary | ICD-10-CM

## 2021-10-19 DIAGNOSIS — I5032 Chronic diastolic (congestive) heart failure: Secondary | ICD-10-CM | POA: Diagnosis not present

## 2021-10-19 DIAGNOSIS — E039 Hypothyroidism, unspecified: Secondary | ICD-10-CM | POA: Diagnosis not present

## 2021-10-19 DIAGNOSIS — D869 Sarcoidosis, unspecified: Secondary | ICD-10-CM | POA: Diagnosis present

## 2021-10-19 DIAGNOSIS — Z955 Presence of coronary angioplasty implant and graft: Secondary | ICD-10-CM

## 2021-10-19 DIAGNOSIS — E1165 Type 2 diabetes mellitus with hyperglycemia: Secondary | ICD-10-CM | POA: Diagnosis not present

## 2021-10-19 DIAGNOSIS — E860 Dehydration: Secondary | ICD-10-CM | POA: Diagnosis present

## 2021-10-19 DIAGNOSIS — Z794 Long term (current) use of insulin: Secondary | ICD-10-CM | POA: Diagnosis not present

## 2021-10-19 DIAGNOSIS — R0602 Shortness of breath: Secondary | ICD-10-CM | POA: Diagnosis not present

## 2021-10-19 DIAGNOSIS — Z7982 Long term (current) use of aspirin: Secondary | ICD-10-CM

## 2021-10-19 DIAGNOSIS — Z8249 Family history of ischemic heart disease and other diseases of the circulatory system: Secondary | ICD-10-CM

## 2021-10-19 DIAGNOSIS — R079 Chest pain, unspecified: Secondary | ICD-10-CM | POA: Diagnosis not present

## 2021-10-19 DIAGNOSIS — E785 Hyperlipidemia, unspecified: Secondary | ICD-10-CM | POA: Diagnosis present

## 2021-10-19 DIAGNOSIS — E1122 Type 2 diabetes mellitus with diabetic chronic kidney disease: Secondary | ICD-10-CM | POA: Diagnosis not present

## 2021-10-19 DIAGNOSIS — H9190 Unspecified hearing loss, unspecified ear: Secondary | ICD-10-CM | POA: Diagnosis present

## 2021-10-19 DIAGNOSIS — I493 Ventricular premature depolarization: Secondary | ICD-10-CM | POA: Diagnosis not present

## 2021-10-19 DIAGNOSIS — R109 Unspecified abdominal pain: Secondary | ICD-10-CM | POA: Diagnosis not present

## 2021-10-19 DIAGNOSIS — I2510 Atherosclerotic heart disease of native coronary artery without angina pectoris: Secondary | ICD-10-CM | POA: Diagnosis not present

## 2021-10-19 DIAGNOSIS — G4733 Obstructive sleep apnea (adult) (pediatric): Secondary | ICD-10-CM | POA: Diagnosis present

## 2021-10-19 DIAGNOSIS — I472 Ventricular tachycardia, unspecified: Secondary | ICD-10-CM | POA: Diagnosis present

## 2021-10-19 DIAGNOSIS — Z974 Presence of external hearing-aid: Secondary | ICD-10-CM

## 2021-10-19 DIAGNOSIS — I25119 Atherosclerotic heart disease of native coronary artery with unspecified angina pectoris: Secondary | ICD-10-CM | POA: Diagnosis present

## 2021-10-19 DIAGNOSIS — Z7952 Long term (current) use of systemic steroids: Secondary | ICD-10-CM

## 2021-10-19 DIAGNOSIS — R531 Weakness: Secondary | ICD-10-CM | POA: Diagnosis not present

## 2021-10-19 DIAGNOSIS — Z7902 Long term (current) use of antithrombotics/antiplatelets: Secondary | ICD-10-CM

## 2021-10-19 DIAGNOSIS — D860 Sarcoidosis of lung: Secondary | ICD-10-CM | POA: Diagnosis not present

## 2021-10-19 DIAGNOSIS — J180 Bronchopneumonia, unspecified organism: Secondary | ICD-10-CM | POA: Diagnosis present

## 2021-10-19 DIAGNOSIS — Z841 Family history of disorders of kidney and ureter: Secondary | ICD-10-CM

## 2021-10-19 DIAGNOSIS — Z888 Allergy status to other drugs, medicaments and biological substances status: Secondary | ICD-10-CM

## 2021-10-19 DIAGNOSIS — M19072 Primary osteoarthritis, left ankle and foot: Secondary | ICD-10-CM | POA: Diagnosis present

## 2021-10-19 DIAGNOSIS — Z885 Allergy status to narcotic agent status: Secondary | ICD-10-CM

## 2021-10-19 DIAGNOSIS — Z79890 Hormone replacement therapy (postmenopausal): Secondary | ICD-10-CM

## 2021-10-19 DIAGNOSIS — Z79899 Other long term (current) drug therapy: Secondary | ICD-10-CM

## 2021-10-19 DIAGNOSIS — J189 Pneumonia, unspecified organism: Secondary | ICD-10-CM | POA: Diagnosis not present

## 2021-10-19 DIAGNOSIS — Z981 Arthrodesis status: Secondary | ICD-10-CM

## 2021-10-19 LAB — URINALYSIS, ROUTINE W REFLEX MICROSCOPIC
Bacteria, UA: NONE SEEN
Bilirubin Urine: NEGATIVE
Glucose, UA: >=500 mg/dL — AB
Hgb urine dipstick: NEGATIVE
Ketones, ur: NEGATIVE mg/dL
Leukocytes,Ua: NEGATIVE
Nitrite: NEGATIVE
Protein, ur: NEGATIVE mg/dL
Specific Gravity, Urine: 1.017 (ref 1.005–1.030)
pH: 5 (ref 5.0–8.0)

## 2021-10-19 LAB — COMPREHENSIVE METABOLIC PANEL
ALT: 18 U/L (ref 0–44)
AST: 20 U/L (ref 15–41)
Albumin: 3 g/dL — ABNORMAL LOW (ref 3.5–5.0)
Alkaline Phosphatase: 84 U/L (ref 38–126)
Anion gap: 11 (ref 5–15)
BUN: 35 mg/dL — ABNORMAL HIGH (ref 8–23)
CO2: 23 mmol/L (ref 22–32)
Calcium: 8.4 mg/dL — ABNORMAL LOW (ref 8.9–10.3)
Chloride: 100 mmol/L (ref 98–111)
Creatinine, Ser: 1.6 mg/dL — ABNORMAL HIGH (ref 0.61–1.24)
GFR, Estimated: 45 mL/min — ABNORMAL LOW (ref >60)
Glucose, Bld: 527 mg/dL (ref 70–99)
Potassium: 4.1 mmol/L (ref 3.5–5.1)
Sodium: 134 mmol/L — ABNORMAL LOW (ref 135–145)
Total Bilirubin: 0.5 mg/dL (ref 0.3–1.2)
Total Protein: 5.9 g/dL — ABNORMAL LOW (ref 6.5–8.1)

## 2021-10-19 LAB — CBC WITH DIFFERENTIAL/PLATELET
Abs Immature Granulocytes: 0.47 10*3/uL — ABNORMAL HIGH (ref 0.00–0.07)
Basophils Absolute: 0.1 10*3/uL (ref 0.0–0.1)
Basophils Relative: 1 %
Eosinophils Absolute: 0 10*3/uL (ref 0.0–0.5)
Eosinophils Relative: 0 %
HCT: 36.7 % — ABNORMAL LOW (ref 39.0–52.0)
Hemoglobin: 11.1 g/dL — ABNORMAL LOW (ref 13.0–17.0)
Immature Granulocytes: 5 %
Lymphocytes Relative: 7 %
Lymphs Abs: 0.7 10*3/uL (ref 0.7–4.0)
MCH: 26.8 pg (ref 26.0–34.0)
MCHC: 30.2 g/dL (ref 30.0–36.0)
MCV: 88.6 fL (ref 80.0–100.0)
Monocytes Absolute: 0.5 10*3/uL (ref 0.1–1.0)
Monocytes Relative: 4 %
Neutro Abs: 8.7 10*3/uL — ABNORMAL HIGH (ref 1.7–7.7)
Neutrophils Relative %: 83 %
Platelets: 321 10*3/uL (ref 150–400)
RBC: 4.14 MIL/uL — ABNORMAL LOW (ref 4.22–5.81)
RDW: 20.9 % — ABNORMAL HIGH (ref 11.5–15.5)
WBC: 10.4 10*3/uL (ref 4.0–10.5)
nRBC: 0 % (ref 0.0–0.2)

## 2021-10-19 LAB — TROPONIN I (HIGH SENSITIVITY)
Troponin I (High Sensitivity): 11 ng/L (ref <18)
Troponin I (High Sensitivity): 11 ng/L (ref <18)

## 2021-10-19 LAB — BRAIN NATRIURETIC PEPTIDE: B Natriuretic Peptide: 118.7 pg/mL — ABNORMAL HIGH (ref 0.0–100.0)

## 2021-10-19 LAB — CBG MONITORING, ED: Glucose-Capillary: 360 mg/dL — ABNORMAL HIGH (ref 70–99)

## 2021-10-19 MED ORDER — IPRATROPIUM-ALBUTEROL 0.5-2.5 (3) MG/3ML IN SOLN
6.0000 mL | Freq: Once | RESPIRATORY_TRACT | Status: AC
  Administered 2021-10-19: 6 mL via RESPIRATORY_TRACT
  Filled 2021-10-19: qty 3

## 2021-10-19 MED ORDER — GUAIFENESIN ER 600 MG PO TB12
1200.0000 mg | ORAL_TABLET | Freq: Once | ORAL | Status: AC
  Administered 2021-10-19: 1200 mg via ORAL
  Filled 2021-10-19: qty 2

## 2021-10-19 MED ORDER — IOHEXOL 350 MG/ML SOLN
75.0000 mL | Freq: Once | INTRAVENOUS | Status: AC | PRN
  Administered 2021-10-19: 75 mL via INTRAVENOUS

## 2021-10-19 MED ORDER — LACTATED RINGERS IV BOLUS
500.0000 mL | Freq: Once | INTRAVENOUS | Status: AC
  Administered 2021-10-19: 500 mL via INTRAVENOUS

## 2021-10-19 MED ORDER — INSULIN ASPART 100 UNIT/ML IJ SOLN
0.0000 [IU] | Freq: Three times a day (TID) | INTRAMUSCULAR | Status: DC
  Administered 2021-10-20: 15 [IU] via SUBCUTANEOUS
  Administered 2021-10-20: 5 [IU] via SUBCUTANEOUS
  Administered 2021-10-20: 11 [IU] via SUBCUTANEOUS
  Administered 2021-10-20: 5 [IU] via SUBCUTANEOUS
  Administered 2021-10-21: 3 [IU] via SUBCUTANEOUS
  Filled 2021-10-19: qty 0.15

## 2021-10-19 MED ORDER — ENOXAPARIN SODIUM 40 MG/0.4ML IJ SOSY
40.0000 mg | PREFILLED_SYRINGE | INTRAMUSCULAR | Status: DC
  Administered 2021-10-19 – 2021-10-20 (×3): 40 mg via SUBCUTANEOUS
  Filled 2021-10-19 (×2): qty 0.4

## 2021-10-19 MED ORDER — LACTATED RINGERS IV BOLUS
1000.0000 mL | Freq: Once | INTRAVENOUS | Status: AC
  Administered 2021-10-19: 1000 mL via INTRAVENOUS

## 2021-10-19 NOTE — ED Provider Triage Note (Signed)
Emergency Medicine Provider Triage Evaluation Note  [name] , a 76 y.o. male  was evaluated in triage.  Pt complains of continuing pneumonia.  Patient reports that he was recently admitted and discharged from this hospital on 8/3.  The patient was admitted on 7/28.  The patient states that he is continuing to have pneumonia.  Patient reports that he has not had any follow-up imaging however he did have a follow-up appointment with his doctor's office today and they stated that he probably still had pneumonia.  The patient denies any fevers however continues to complain of cough.  Patient denies any body aches or chills, nausea or vomiting, shortness of breath.  Patient has history of sarcoidosis.  Review of Systems  Positive:  Negative:   Physical Exam  BP 123/86 (BP Location: Left Arm)   Pulse (!) 102   Temp 98.4 F (36.9 C) (Oral)   Resp 18   Ht '5\' 10"'$  (1.778 m)   Wt 91.6 kg   SpO2 95%   BMI 28.98 kg/m  Gen:   Awake, no distress   Resp:  Normal effort  MSK:   Moves extremities without difficulty  Other:    Medical Decision Making  Medically screening exam initiated at 3:56 PM.  Appropriate orders placed.  [name] was informed that the remainder of the evaluation will be completed by another provider, this initial triage assessment does not replace that evaluation, and the importance of remaining in the ED until their evaluation is complete.     [name], PA-C 10/19/21 1557

## 2021-10-19 NOTE — ED Provider Notes (Signed)
[facility] DEPT Provider Note   CSN: [id number] Arrival date & time: 10/19/21  1514     History  Chief Complaint  Patient presents with   Shortness of Breath    [name] is a 76 y.o. male.  76 year old male with past medical history significant for CAD s/p PCI, recent back surgery for herniated disc, sarcoidosis, hypothyroidism, diabetes presents today for evaluation of chest pain, shortness of breath persistent since recent discharge.  Patient was admitted for pneumonia, and volume overload.  Reports downtrending weight since discharge.  He reports he felt well for 2 to 3 days following discharge however since then he has had shortness of breath at rest, and chest pain.  He states that chest pain feels similar to his prior MI as well as the pain he gets with a sarcoidosis flareup.  Patient was evaluated at PCP office and was recommended to come into emergency room.  He also endorses fatigue and weakness that has been present since he left the hospital.  He also reports significant coughing spells.  He states he knows there is a lot of  chest congestion but he is unable to produce phlegm with his cough.  He states a few days ago he was weak to the point he could not hold himself up while he was using the walker and he laid himself down slowly.  He is concerned regarding dehydration.  Patient uses walker at baseline.  The history is provided by the patient. No language interpreter was used.       Home Medications Prior to Admission medications   Medication Sig Start Date End Date Taking? Authorizing Provider  acetaminophen (TYLENOL) 500 MG tablet Take 1,000 mg by mouth daily as needed for mild pain (pain).    [provider]  albuterol (PROVENTIL) (2.5 MG/3ML) 0.083% nebulizer solution Take 3 mLs (2.5 mg total) by nebulization every 6 (six) hours as needed for wheezing or shortness of breath. Dx Code D86.0 07/24/14   Elsie Stain, MD   Albuterol Sulfate (PROAIR RESPICLICK) 073 (90 BASE) MCG/ACT AEPB Inhale 2 puffs into the lungs every 6 (six) hours as needed. Patient taking differently: Inhale 2 puffs into the lungs as needed (for shortness of breath). 07/24/14   Elsie Stain, MD  aspirin EC 81 MG tablet Take 81 mg by mouth every evening.     [provider]  Cholecalciferol (VITAMIN D3) 3000 UNITS TABS Take 3,000 Units by mouth daily.    [provider]  clopidogrel (PLAVIX) 75 MG tablet Take 1 tablet (75 mg total) by mouth daily. Restart on 09/28/21 09/25/21   Heath Lark D, DO  Coenzyme Q10 (COQ-10) 400 MG CAPS Take 400 mg by mouth 2 (two) times a week. Sunday, Wednesday    [provider]  ezetimibe (ZETIA) 10 MG tablet Take 10 mg by mouth once a week. 06/25/21   [provider]  furosemide (LASIX) 80 MG tablet Take 1 tablet (80 mg total) by mouth daily as needed for edema. Patient taking differently: Take 80 mg by mouth daily. 09/16/21   Geradine Girt, DO  gabapentin (NEURONTIN) 100 MG capsule Take 1 capsule (100 mg total) by mouth 3 (three) times daily. Patient taking differently: Take 100 mg by mouth 3 (three) times daily as needed (pain). 09/16/21   Geradine Girt, DO  hydrALAZINE (APRESOLINE) 10 MG tablet Take 1 tablet (10 mg total) by mouth every 8 (eight) hours. Patient taking differently: Take 10  mg by mouth 3 (three) times daily. 09/16/21   Geradine Girt, DO  insulin aspart protamine- aspart (NOVOLOG MIX 70/30) (70-30) 100 UNIT/ML injection Inject 0.4 mLs (40 Units total) into the skin with breakfast, with lunch, and with evening meal. Patient taking differently: Inject 25-35 Units into the skin with breakfast, with lunch, and with evening meal. 09/16/21   Geradine Girt, DO  levothyroxine (SYNTHROID) 75 MCG tablet Take 75 mcg by mouth daily. 02/24/21   [provider]  lidocaine (LIDODERM) 5 % Place 1 patch onto the skin daily as needed for pain. 10/21/20   [provider]  montelukast (SINGULAIR) 10 MG tablet Take 10 mg by mouth daily as needed (allergies).  07/18/18   [provider]  Multiple Vitamin (MULTIVITAMIN WITH MINERALS) TABS tablet Take 1 tablet by mouth daily. 10/08/18   Hongalgi, Lenis Dickinson, MD  nitroGLYCERIN (NITROSTAT) 0.4 MG SL tablet Place 1 tablet (0.4 mg total) under the tongue every 5 (five) minutes as needed for chest pain. 07/01/20 11/13/21  Imogene Burn, PA-C  OVER THE COUNTER MEDICATION Take 1 tablet by mouth daily. Omega XL    [provider]  oxyCODONE (OXY IR/ROXICODONE) 5 MG immediate release tablet Take 1 tablet (5 mg total) by mouth every 4 (four) hours as needed for severe pain or breakthrough pain. 09/25/21   Nita Sells, MD  potassium chloride (KLOR-CON M) 10 MEQ tablet Take 10 mEq by mouth daily.    [provider]  predniSONE (DELTASONE) 10 MG tablet Take 10 mg by mouth daily. Patient not taking: Reported on 10/01/2021    [provider]  RABEprazole (ACIPHEX) 20 MG tablet Take 20 mg by mouth daily. 09/28/21   [provider]  rosuvastatin (CRESTOR) 20 MG tablet Take 20 mg by mouth daily. Patient not taking: Reported on 09/23/2021    [provider]      Allergies    Statins, Hydrocortisone, Ranexa [ranolazine], Hydrocodone, Miralax [polyethylene glycol], Z-pak [azithromycin], and Zaroxolyn [metolazone]    Review of Systems   Review of Systems  Constitutional:  Positive for fatigue. Negative for chills and fever.  Respiratory:  Positive for shortness of breath.   Cardiovascular:  Positive for chest pain and leg swelling. Negative for palpitations.  Gastrointestinal:  Positive for nausea. Negative for abdominal pain and vomiting.  Neurological:  Positive for weakness. Negative for syncope and light-headedness.  All other systems reviewed and are negative.   Physical Exam Updated Vital Signs BP (!) 146/75 (BP Location: Left Arm)   Pulse 88   Temp 97.8  F (36.6 C) (Oral)   Resp 19   Ht '5\' 10"'$  (1.778 m)   Wt 91.6 kg   SpO2 96%   BMI 28.98 kg/m  Physical Exam Vitals and nursing note reviewed.  Constitutional:      General: He is not in acute distress.    Appearance: Normal appearance. He is not ill-appearing.  HENT:     Head: Normocephalic and atraumatic.     Nose: Nose normal.  Eyes:     General: No scleral icterus.    Extraocular Movements: Extraocular movements intact.     Conjunctiva/sclera: Conjunctivae normal.  Cardiovascular:     Rate and Rhythm: Normal rate and regular rhythm.     Pulses: Normal pulses.  Pulmonary:     Effort: Pulmonary effort is normal. No respiratory distress.     Breath sounds: Normal breath sounds. No wheezing or rales.  Abdominal:  General: There is no distension.     Palpations: Abdomen is soft.     Tenderness: There is no abdominal tenderness. There is no guarding.  Musculoskeletal:        General: Normal range of motion.     Cervical back: Normal range of motion.     Right lower leg: Edema (1+ pitting edema) present.     Left lower leg: Edema (1+ pitting edema) present.  Skin:    General: Skin is warm and dry.  Neurological:     General: No focal deficit present.     Mental Status: He is alert. Mental status is at baseline.     ED Results / Procedures / Treatments   Labs (all labs ordered are listed, but only abnormal results are displayed) Labs Reviewed  CBC WITH DIFFERENTIAL/PLATELET  COMPREHENSIVE METABOLIC PANEL  BRAIN NATRIURETIC PEPTIDE  URINALYSIS, ROUTINE W REFLEX MICROSCOPIC  TROPONIN I (HIGH SENSITIVITY)    EKG None  Radiology DG Chest 2 View  Result Date: 10/19/2021 CLINICAL DATA:  Chest pain and shortness of breath with recent admission for pneumonia. History of sarcoidosis. EXAM: CHEST - 2 VIEW COMPARISON:  10/01/2021 plain film and CT. Remote chest radiograph of 08/28/2019 also reviewed. FINDINGS: Midline trachea. Mild cardiomegaly. No pleural effusion or  pneumothorax. Improved bibasilar aeration since 10/01/2021. Residual interstitial thickening and bibasilar scarring are relatively similar to 08/28/2019. IMPRESSION: Improved bibasilar aeration, with scarring, felt to be similar to 08/28/2019. Basilar predominant interstitial thickening, unchanged and nonspecific. Electronically Signed   By: Abigail Miyamoto M.D.   On: 10/19/2021 16:27    Procedures Procedures    Medications Ordered in ED Medications - No data to display  ED Course/ Medical Decision Making/ A&P                           Medical Decision Making Amount and/or Complexity of Data Reviewed Labs: ordered. Radiology: ordered.  Risk OTC drugs. Prescription drug management. Decision regarding hospitalization.   Medical Decision Making / ED Course   This patient presents to the ED for concern of chest pain, shortness of breath, weakness, this involves an extensive number of treatment options, and is a complaint that carries with it a high risk of complications and morbidity.  The differential diagnosis includes ACS, PE, pneumonia, UTI, dehydration  MDM: 76 year old male with past medical history noted above presents today for evaluation of chest pain, shortness of breath, weakness.  Referred from PCP office.  Will evaluate with basic blood work, with addition of BNP, troponin.  EKG without acute ischemic changes.  Chest x-ray similar to plain film from recent admission.  He does have bilateral pitting edema.  Reports downtrending weight since discharge.  Will evaluate with CTA chest PE study.  CBC without leukocytosis.  Hemoglobin 11.1 which is around his baseline.  CMP shows glucose of 527, creatinine of 1.60.  Creatinine is elevated from 1.16 during recent admission.  Troponin 11x2.  Doubt ACS.  BNP downtrending to 118 following recent admission.  UA without evidence of UTI but does show glucosuria.  Patient given 500 mL of fluid.  Glucose improved to 360.  CTA chest PE study is  negative for PE.  Shows resolving pneumonia.  Given patient is overall not feeling well with weakness, with AKI will discuss with hospitalist for admission.  Concern for dehydration secondary to overdiuresis.  Patient's discharge weight was about 96 kg.  Today is 91.6 kg. Patient would benefit from  gentle hydration given his history of CHF.  Discussed with hospitalist will evaluate patient for admission.   Lab Tests: -I ordered, reviewed, and interpreted labs.   The pertinent results include:   Labs Reviewed  CBC WITH DIFFERENTIAL/PLATELET  COMPREHENSIVE METABOLIC PANEL  BRAIN NATRIURETIC PEPTIDE  URINALYSIS, ROUTINE W REFLEX MICROSCOPIC  TROPONIN I (HIGH SENSITIVITY)      EKG  EKG Interpretation  Date/Time:    Ventricular Rate:    PR Interval:    QRS Duration:   QT Interval:    QTC Calculation:   R Axis:     Text Interpretation:           Imaging Studies ordered: I ordered imaging studies including CTA chest PE study, chest x-ray I independently visualized and interpreted imaging. I agree with the radiologist interpretation   Medicines ordered and prescription drug management: No orders of the defined types were placed in this encounter.   -I have reviewed the patients home medicines and have made adjustments as needed  Cardiac Monitoring: The patient was maintained on a cardiac monitor.  I personally viewed and interpreted the cardiac monitored which showed an underlying rhythm of: Normal sinus rhythm  Reevaluation: After the interventions noted above, I reevaluated the patient and found that they have :stayed the same  Co morbidities that complicate the patient evaluation  Past Medical History:  Diagnosis Date   Adrenal insufficiency (HCC)    Allergy    Anemia    Arthritis    feet    Broken foot 09/2019   had to wore a boot. Left    CKD (chronic kidney disease), stage III (HCC)    Coronary artery disease    has stents   Coronary atherosclerosis of  native coronary artery    Proximal LAD, posterior lateral stent widely patent-10/12/11   Diabetes mellitus    insulin and pills   Hearing loss    wears hearing aids   History of blood transfusion 06/30/2016   [facility] - 2 units transfused   Hypertension    OSA (obstructive sleep apnea)    uses VPAC sleep study 2 years done through [facility]. Dr. Marlou Porch arranged study   Sarcoidosis    Sleep apnea    uses CPAP nightly   Thyroid disease    Type 2 diabetes mellitus ([place])       Dispostion: Discussed with hospitalist will evaluate patient for admission.  Final Clinical Impression(s) / ED Diagnoses Final diagnoses:  AKI (acute kidney injury) [facility])  Dehydration    Rx / DC Orders ED Discharge Orders     None         Evlyn Courier, PA-C 10/19/21 2145    Godfrey Pick, MD 10/21/21 [id number]

## 2021-10-19 NOTE — H&P (Signed)
History and Physical    Patient: [name] HWE:[id number] DOB: Apr 23, 1945 DOA: 10/19/2021 DOS: the patient was seen and examined on 10/20/2021 PCP: Shirline Frees, MD  Patient coming from: Home  Chief Complaint:  Chief Complaint  Patient presents with   Shortness of Breath   HPI: [name] is a 76 y.o. male with medical history significant of CAD s/p stents, HFpEF, sarcoidosis on chronic prednisone, insulin dependent T2DM,, CKD 3a, hypothyroidism who presents with worsening shortness of breath and chest pain.   He was recently admitted from 7/28 to 10/07/2021 with left lower lobe pneumonia treated initially with IV vanc/cefepime/azithromycin. Then sent home with Cefdinir and doxycycline. Prednisone also resumed for possible sarcoidosis flare. He was also diuresed for acute on chronic diastolic HF.    He felt improved the day of discharge but since then has been increasing short of breath. Can barely walk across his house. Has felt chest pressure that is worse at night when he sits or sleeps. Has cough but cannot get anything up. No LE edema and thinks he actually lost 5lbs in the past week. Saw PCP last week and advised to go to ED but declined and took Z-pak instead which did not help. Followed up with PCP with persistent symptoms today and finally decide to come in.   In the ED, he was afebrile, normotensive on room. No leukocytosis.  Hemoglobin of 11.1 with baseline of 9-10.Creatinine of 1.6 up from 1.16.  CBG of 527 with normal CO2 and normal anion gap. BNP of 118.  Troponin at 11x2. CTA chest obtained showed no pulmonary embolism.  Previous left lower lobe pneumonia appears to be resolving.  He was given IV fluids, DuoNeb and hospitalist was then called for admission.  Review of Systems: As mentioned in the history of present illness. All other systems reviewed and are negative. Past Medical History:  Diagnosis Date   Adrenal insufficiency ([place])    Allergy    Anemia    Arthritis     feet    Broken foot 09/2019   had to wore a boot. Left    CKD (chronic kidney disease), stage III (HCC)    Coronary artery disease    has stents   Coronary atherosclerosis of native coronary artery    Proximal LAD, posterior lateral stent widely patent-10/12/11   Diabetes mellitus    insulin and pills   Hearing loss    wears hearing aids   History of blood transfusion 06/30/2016   [facility] - 2 units transfused   Hypertension    OSA (obstructive sleep apnea)    uses VPAC sleep study 2 years done through [facility]. Dr. Marlou Porch arranged study   Sarcoidosis    Sleep apnea    uses CPAP nightly   Thyroid disease    Type 2 diabetes mellitus [facility])    Past Surgical History:  Procedure Laterality Date   APPENDECTOMY     CATARACT EXTRACTION  2011   bilat   CHOLECYSTECTOMY  01/25/2011   Procedure: LAPAROSCOPIC CHOLECYSTECTOMY WITH INTRAOPERATIVE CHOLANGIOGRAM;  Surgeon: Judieth Keens, DO;  Location: [facility];  Service: General;  Laterality: N/A;   COLONOSCOPY     several   COLONOSCOPY WITH PROPOFOL N/A 01/03/2020   Procedure: COLONOSCOPY WITH PROPOFOL;  Surgeon: Gatha Mayer, MD;  Location: [facility];  Service: Endoscopy;  Laterality: N/A;   CORONARY ANGIOPLASTY     most recent 11/2009   CORONARY STENT INTERVENTION N/A 08/07/2018   Procedure: CORONARY STENT  INTERVENTION;  Surgeon: Jettie Booze, MD;  Location: [place] [facility];  Service: Cardiovascular;  Laterality: N/A;   CORONARY STENT INTERVENTION N/A 01/18/2021   Procedure: CORONARY STENT INTERVENTION;  Surgeon: Sherren Mocha, MD;  Location: [place] [facility];  Service: Cardiovascular;  Laterality: N/A;   CORONARY STENT PLACEMENT  2009   in LAD and side branch PTCA   ESOPHAGOGASTRODUODENOSCOPY (EGD) WITH PROPOFOL N/A 01/03/2020   Procedure: ESOPHAGOGASTRODUODENOSCOPY (EGD) WITH PROPOFOL;  Surgeon: Gatha Mayer, MD;  Location: [facility];  Service: Endoscopy;  Laterality: N/A;   HEMOSTASIS CLIP PLACEMENT   01/03/2020   Procedure: HEMOSTASIS CLIP PLACEMENT;  Surgeon: Gatha Mayer, MD;  Location: [facility];  Service: Endoscopy;;   INTERCOSTAL NERVE BLOCK  2011, 06/2016   x2. lumbar spine   INTRAVASCULAR PRESSURE WIRE/FFR STUDY N/A 01/18/2021   Procedure: INTRAVASCULAR PRESSURE WIRE/FFR STUDY;  Surgeon: Sherren Mocha, MD;  Location: [place] [facility];  Service: Cardiovascular;  Laterality: N/A;   LEFT HEART CATHETERIZATION WITH CORONARY ANGIOGRAM Bilateral 10/12/2011   Procedure: LEFT HEART CATHETERIZATION WITH CORONARY ANGIOGRAM;  Surgeon: Candee Furbish, MD;  Location: [facility] CATH LAB;  Service: Cardiovascular;  Laterality: Bilateral;   LEFT HEART CATHETERIZATION WITH CORONARY ANGIOGRAM N/A 04/01/2014   Procedure: LEFT HEART CATHETERIZATION WITH CORONARY ANGIOGRAM;  Surgeon: Candee Furbish, MD;  Location: [facility] CATH LAB;  Service: Cardiovascular;  Laterality: N/A;   POLYPECTOMY  01/03/2020   Procedure: POLYPECTOMY;  Surgeon: Gatha Mayer, MD;  Location: [facility];  Service: Endoscopy;;   RIGHT/LEFT HEART CATH AND CORONARY ANGIOGRAPHY N/A 08/07/2018   Procedure: RIGHT/LEFT HEART CATH AND CORONARY ANGIOGRAPHY;  Surgeon: Jettie Booze, MD;  Location: [place] [facility] LAB;  Service: Cardiovascular;  Laterality: N/A;   RIGHT/LEFT HEART CATH AND CORONARY ANGIOGRAPHY N/A 12/25/2020   Procedure: RIGHT/LEFT HEART CATH AND CORONARY ANGIOGRAPHY;  Surgeon: Belva Crome, MD;  Location: [place] [facility];  Service: Cardiovascular;  Laterality: N/A;   TRANSFORAMINAL LUMBAR INTERBODY FUSION W/ MIS 1 LEVEL N/A 09/24/2021   Procedure: LUMBAR FOUR-FIVE MINIMALLY INVASIVE (MIS) TRANSFORAMINAL LUMBAR INTERBODY FUSION (TLIF) WITH METRX;  Surgeon: Judith Part, MD;  Location: [facility];  Service: Neurosurgery;  Laterality: N/A;   Social History:  reports that he has never smoked. He has never used smokeless tobacco. He reports that he does not drink alcohol and does not use drugs.  Allergies  Allergen Reactions    Statins Other (See Comments)    Pt says they make him "mean"   Hydrocortisone Nausea Only   Ranexa [Ranolazine] Other (See Comments)    Severe weakness/near syncope after 1 dose Hallucinations    Hydrocodone Nausea And Vomiting   Miralax [Polyethylene Glycol] Nausea And Vomiting   Z-Pak [Azithromycin] Other (See Comments)    Raises blood sugar   Zaroxolyn [Metolazone] Other (See Comments)    Drained, no energy    Family History  Problem Relation Age of Onset   Kidney disease Mother    Diabetes Mother    Kidney cancer Mother    Heart attack Father    Asthma Sister    Anesthesia problems Sister        "Kidney's did not wake up"   Sarcoidosis Sister    Sarcoidosis Niece    Colon cancer Neg Hx    Colon polyps Neg Hx    Rectal cancer Neg Hx    Stomach cancer Neg Hx     Prior to Admission medications   Medication Sig Start Date End Date Taking?  Authorizing Provider  acetaminophen (TYLENOL) 500 MG tablet Take 1,000 mg by mouth daily as needed for mild pain (pain).    [provider]  albuterol (PROVENTIL) (2.5 MG/3ML) 0.083% nebulizer solution Take 3 mLs (2.5 mg total) by nebulization every 6 (six) hours as needed for wheezing or shortness of breath. Dx Code D86.0 07/24/14   Elsie Stain, MD  Albuterol Sulfate (PROAIR RESPICLICK) 440 (90 BASE) MCG/ACT AEPB Inhale 2 puffs into the lungs every 6 (six) hours as needed. Patient taking differently: Inhale 2 puffs into the lungs as needed (for shortness of breath). 07/24/14   Elsie Stain, MD  aspirin EC 81 MG tablet Take 81 mg by mouth every evening.     [provider]  Cholecalciferol (VITAMIN D3) 3000 UNITS TABS Take 3,000 Units by mouth daily.    [provider]  clopidogrel (PLAVIX) 75 MG tablet Take 1 tablet (75 mg total) by mouth daily. Restart on 09/28/21 09/25/21   Heath Lark D, DO  Coenzyme Q10 (COQ-10) 400 MG CAPS Take 400 mg by mouth 2 (two) times a week. Sunday, Wednesday    [provider]  ezetimibe (ZETIA) 10 MG tablet Take 10 mg by mouth once a week. 06/25/21   [provider]  furosemide (LASIX) 80 MG tablet Take 1 tablet (80 mg total) by mouth daily as needed for edema. Patient taking differently: Take 80 mg by mouth daily. 09/16/21   Geradine Girt, DO  gabapentin (NEURONTIN) 100 MG capsule Take 1 capsule (100 mg total) by mouth 3 (three) times daily. Patient taking differently: Take 100 mg by mouth 3 (three) times daily as needed (pain). 09/16/21   Geradine Girt, DO  hydrALAZINE (APRESOLINE) 10 MG tablet Take 1 tablet (10 mg total) by mouth every 8 (eight) hours. Patient taking differently: Take 10 mg by mouth 3 (three) times daily. 09/16/21   Geradine Girt, DO  insulin aspart protamine- aspart (NOVOLOG MIX 70/30) (70-30) 100 UNIT/ML injection Inject 0.4 mLs (40 Units total) into the skin with breakfast, with lunch, and with evening meal. Patient taking differently: Inject 25-35 Units into the skin with breakfast, with lunch, and with evening meal. 09/16/21   Geradine Girt, DO  levothyroxine (SYNTHROID) 75 MCG tablet Take 75 mcg by mouth daily. 02/24/21   [provider]  lidocaine (LIDODERM) 5 % Place 1 patch onto the skin daily as needed for pain. 10/21/20   [provider]  montelukast (SINGULAIR) 10 MG tablet Take 10 mg by mouth daily as needed (allergies).  07/18/18   [provider]  Multiple Vitamin (MULTIVITAMIN WITH MINERALS) TABS tablet Take 1 tablet by mouth daily. 10/08/18   Hongalgi, Lenis Dickinson, MD  nitroGLYCERIN (NITROSTAT) 0.4 MG SL tablet Place 1 tablet (0.4 mg total) under the tongue every 5 (five) minutes as needed for chest pain. 07/01/20 11/13/21  Imogene Burn, PA-C  OVER THE COUNTER MEDICATION Take 1 tablet by mouth daily. Omega XL    [provider]  oxyCODONE (OXY IR/ROXICODONE) 5 MG immediate release tablet Take 1 tablet (5 mg total) by mouth every 4 (four) hours as needed for severe pain or  breakthrough pain. 09/25/21   Nita Sells, MD  potassium chloride (KLOR-CON M) 10 MEQ tablet Take 10 mEq by mouth daily.    [provider]  predniSONE (DELTASONE) 10 MG tablet Take 10 mg by mouth daily. Patient not taking: Reported on 10/01/2021    [provider]  RABEprazole (ACIPHEX)  20 MG tablet Take 20 mg by mouth daily. 09/28/21   [provider]  rosuvastatin (CRESTOR) 20 MG tablet Take 20 mg by mouth daily. Patient not taking: Reported on 09/23/2021    [provider]    Physical Exam: Vitals:   10/19/21 2030 10/19/21 2100 10/19/21 2300 10/20/21 0024  BP: (!) 157/91 (!) 165/89 137/76   Pulse: 85 85 83   Resp: '16 13 15   '$ Temp:    97.8 F (36.6 C)  TempSrc:      SpO2: 99% 99% 95%   Weight:      Height:       Constitutional: NAD, calm, comfortable, nontoxic appearing elderly male laying at approximately 30 degree incline in bed Eyes: lids and conjunctivae normal ENMT: Mucous membranes are moist.  Neck: normal, supple Respiratory: clear to auscultation bilaterally, no wheezing, no crackles. Normal respiratory effort. No accessory muscle use.  Occasional productive sounding cough. Cardiovascular: Regular rate and rhythm, no murmurs / rubs / gallops. No extremity edema. Abdomen: Soft, nontender nondistended.  Bowel sounds positive.  Musculoskeletal: no clubbing / cyanosis. No joint deformity upper and lower extremities. Good ROM, no contractures. Normal muscle tone.  Skin: no rashes, lesions, ulcers. No induration Neurologic: CN 2-12 grossly intact. Strength 5/5 in all 4.  Psychiatric: Normal judgment and insight. Alert and oriented x 3. Normal mood. Data Reviewed:  See HPI  Assessment and Plan: * Coronary artery disease with exertional angina ([place]) Presented with symptoms of dyspnea and chest pressure.  He was recently admitted for left lower lobe pneumonia which on CTA chest today appears to be resolving.  There is concerned that  this could be cardiogenic since he has extensive cardiac history.   -Patient has had 4 or 5 PCI procedure with stent to the proximal LAD, balloon angioplasty of a diagonal, stenting to the PDA branch of the right coronary, stenting to the posterior lateral branch of the right coronary, stent to proximal right coronary -He has been following cardiology for persistent fatigue/dyspnea that could be his anginal equivalent.  Back in June, he was recommended to have a left heart cath with possible PCI.  Unfortunately, since then he has been hospitalized for numerous other issues. -Troponin currently reassuring at 11x2.  EKG with PVC and IVCD on my review which is similar to prior.  -need cardiology consult in the morning -continue aspirin and plavix  HTN (hypertension) Continue home antihypertensives  Acute renal failure superimposed on stage 3a chronic kidney disease (HCC) Creatinine of 1.6 up from 1.16. Has received 1.5L of IV fluid in ED.Will hold on any further fluid tonight given heart likelihood of him to be overloaded with his CHF -Follow creatinine in the morning  Hypothyroidism Continue levothyroxine  Uncontrolled type 2 diabetes mellitus with hyperglycemia, with long-term current use of insulin (HCC) CBG >500 with normal CO2 and anion gap. Normally on 70/30 Novolog 35units with meal. Forgot to take insulin with his lunch today. -moderate SSI ACHS  Chronic diastolic heart failure (HCC) Appears euvolemic on exam although has AKI so could be over diuresis. Hold Lasix for now.  Sarcoidosis Questionable whether sacrodosis flare up or if dyspnea more cardiogenic in nature. -duoneb PRN -Start IV steroid      Advance Care Planning:   Code Status: Full Code   Consults: none  Family Communication: none at bedside  Severity of Illness: The appropriate patient status for this patient is OBSERVATION. Observation status is judged to be reasonable and necessary in order to  provide the  required intensity of service to ensure the patient's safety. The patient's presenting symptoms, physical exam findings, and initial radiographic and laboratory data in the context of their medical condition is felt to place them at decreased risk for further clinical deterioration. Furthermore, it is anticipated that the patient will be medically stable for discharge from the hospital within 2 midnights of admission.   Author: Orene Desanctis, DO 10/20/2021 1:10 AM  For on call review [email or web address].

## 2021-10-19 NOTE — Assessment & Plan Note (Signed)
Presented with symptoms of dyspnea and chest pressure.  He was recently admitted for left lower lobe pneumonia which on CTA chest today appears to be resolving.  There is concerned that this could be cardiogenic since he has extensive cardiac history.   -Patient has had 4 or 5 PCI procedure with stent to the proximal LAD, balloon angioplasty of a diagonal, stenting to the PDA branch of the right coronary, stenting to the posterior lateral branch of the right coronary, stent to proximal right coronary -He has been following cardiology for persistent fatigue/dyspnea that could be his anginal equivalent.  Back in June, he was recommended to have a left heart cath with possible PCI.  Unfortunately, since then he has been hospitalized for numerous other issues. -Troponin currently reassuring at 11x2.  EKG with PVC and IVCD on my review which is similar to prior.  -need cardiology consult in the morning -continue aspirin and plavix

## 2021-10-19 NOTE — ED Triage Notes (Signed)
Pt endorses continued chest pain and SHOB since being discharged form admission for pneumonia.

## 2021-10-20 ENCOUNTER — Observation Stay (HOSPITAL_COMMUNITY): Payer: Medicare HMO

## 2021-10-20 ENCOUNTER — Ambulatory Visit: Payer: Medicare HMO | Admitting: Internal Medicine

## 2021-10-20 DIAGNOSIS — Z6828 Body mass index (BMI) 28.0-28.9, adult: Secondary | ICD-10-CM | POA: Diagnosis not present

## 2021-10-20 DIAGNOSIS — I10 Essential (primary) hypertension: Secondary | ICD-10-CM

## 2021-10-20 DIAGNOSIS — I472 Ventricular tachycardia, unspecified: Secondary | ICD-10-CM | POA: Diagnosis present

## 2021-10-20 DIAGNOSIS — Z794 Long term (current) use of insulin: Secondary | ICD-10-CM | POA: Diagnosis not present

## 2021-10-20 DIAGNOSIS — J180 Bronchopneumonia, unspecified organism: Secondary | ICD-10-CM | POA: Diagnosis present

## 2021-10-20 DIAGNOSIS — M19071 Primary osteoarthritis, right ankle and foot: Secondary | ICD-10-CM | POA: Diagnosis present

## 2021-10-20 DIAGNOSIS — I493 Ventricular premature depolarization: Secondary | ICD-10-CM

## 2021-10-20 DIAGNOSIS — E785 Hyperlipidemia, unspecified: Secondary | ICD-10-CM | POA: Diagnosis present

## 2021-10-20 DIAGNOSIS — R079 Chest pain, unspecified: Secondary | ICD-10-CM

## 2021-10-20 DIAGNOSIS — H9190 Unspecified hearing loss, unspecified ear: Secondary | ICD-10-CM | POA: Diagnosis present

## 2021-10-20 DIAGNOSIS — I252 Old myocardial infarction: Secondary | ICD-10-CM | POA: Diagnosis not present

## 2021-10-20 DIAGNOSIS — D860 Sarcoidosis of lung: Secondary | ICD-10-CM | POA: Diagnosis present

## 2021-10-20 DIAGNOSIS — I25118 Atherosclerotic heart disease of native coronary artery with other forms of angina pectoris: Secondary | ICD-10-CM | POA: Diagnosis not present

## 2021-10-20 DIAGNOSIS — M19072 Primary osteoarthritis, left ankle and foot: Secondary | ICD-10-CM | POA: Diagnosis present

## 2021-10-20 DIAGNOSIS — E860 Dehydration: Secondary | ICD-10-CM | POA: Diagnosis present

## 2021-10-20 DIAGNOSIS — I25119 Atherosclerotic heart disease of native coronary artery with unspecified angina pectoris: Secondary | ICD-10-CM | POA: Diagnosis present

## 2021-10-20 DIAGNOSIS — R0609 Other forms of dyspnea: Secondary | ICD-10-CM

## 2021-10-20 DIAGNOSIS — N1831 Chronic kidney disease, stage 3a: Secondary | ICD-10-CM | POA: Diagnosis present

## 2021-10-20 DIAGNOSIS — E1165 Type 2 diabetes mellitus with hyperglycemia: Secondary | ICD-10-CM | POA: Diagnosis present

## 2021-10-20 DIAGNOSIS — I5032 Chronic diastolic (congestive) heart failure: Secondary | ICD-10-CM | POA: Diagnosis present

## 2021-10-20 DIAGNOSIS — I130 Hypertensive heart and chronic kidney disease with heart failure and stage 1 through stage 4 chronic kidney disease, or unspecified chronic kidney disease: Secondary | ICD-10-CM | POA: Diagnosis present

## 2021-10-20 DIAGNOSIS — R109 Unspecified abdominal pain: Secondary | ICD-10-CM | POA: Diagnosis not present

## 2021-10-20 DIAGNOSIS — E2740 Unspecified adrenocortical insufficiency: Secondary | ICD-10-CM | POA: Diagnosis present

## 2021-10-20 DIAGNOSIS — G4733 Obstructive sleep apnea (adult) (pediatric): Secondary | ICD-10-CM | POA: Diagnosis present

## 2021-10-20 DIAGNOSIS — E876 Hypokalemia: Secondary | ICD-10-CM | POA: Diagnosis present

## 2021-10-20 DIAGNOSIS — E1122 Type 2 diabetes mellitus with diabetic chronic kidney disease: Secondary | ICD-10-CM | POA: Diagnosis present

## 2021-10-20 DIAGNOSIS — N179 Acute kidney failure, unspecified: Secondary | ICD-10-CM | POA: Diagnosis present

## 2021-10-20 DIAGNOSIS — E039 Hypothyroidism, unspecified: Secondary | ICD-10-CM | POA: Diagnosis present

## 2021-10-20 DIAGNOSIS — E669 Obesity, unspecified: Secondary | ICD-10-CM | POA: Diagnosis present

## 2021-10-20 LAB — CBC
HCT: 36 % — ABNORMAL LOW (ref 39.0–52.0)
Hemoglobin: 11 g/dL — ABNORMAL LOW (ref 13.0–17.0)
MCH: 27 pg (ref 26.0–34.0)
MCHC: 30.6 g/dL (ref 30.0–36.0)
MCV: 88.5 fL (ref 80.0–100.0)
Platelets: 281 10*3/uL (ref 150–400)
RBC: 4.07 MIL/uL — ABNORMAL LOW (ref 4.22–5.81)
RDW: 20.7 % — ABNORMAL HIGH (ref 11.5–15.5)
WBC: 10.8 10*3/uL — ABNORMAL HIGH (ref 4.0–10.5)
nRBC: 0 % (ref 0.0–0.2)

## 2021-10-20 LAB — BASIC METABOLIC PANEL
Anion gap: 9 (ref 5–15)
BUN: 25 mg/dL — ABNORMAL HIGH (ref 8–23)
CO2: 29 mmol/L (ref 22–32)
Calcium: 8.3 mg/dL — ABNORMAL LOW (ref 8.9–10.3)
Chloride: 101 mmol/L (ref 98–111)
Creatinine, Ser: 1.25 mg/dL — ABNORMAL HIGH (ref 0.61–1.24)
GFR, Estimated: >60 mL/min (ref >60)
Glucose, Bld: 279 mg/dL — ABNORMAL HIGH (ref 70–99)
Potassium: 3.2 mmol/L — ABNORMAL LOW (ref 3.5–5.1)
Sodium: 139 mmol/L (ref 135–145)

## 2021-10-20 LAB — GLUCOSE, CAPILLARY
Glucose-Capillary: 117 mg/dL — ABNORMAL HIGH (ref 70–99)
Glucose-Capillary: 348 mg/dL — ABNORMAL HIGH (ref 70–99)
Glucose-Capillary: 359 mg/dL — ABNORMAL HIGH (ref 70–99)

## 2021-10-20 LAB — MAGNESIUM: Magnesium: 1.9 mg/dL (ref 1.7–2.4)

## 2021-10-20 LAB — CBG MONITORING, ED
Glucose-Capillary: 212 mg/dL — ABNORMAL HIGH (ref 70–99)
Glucose-Capillary: 223 mg/dL — ABNORMAL HIGH (ref 70–99)

## 2021-10-20 MED ORDER — HYDRALAZINE HCL 10 MG PO TABS
10.0000 mg | ORAL_TABLET | Freq: Three times a day (TID) | ORAL | Status: DC
  Administered 2021-10-20 – 2021-10-21 (×4): 10 mg via ORAL
  Filled 2021-10-20 (×4): qty 1

## 2021-10-20 MED ORDER — POTASSIUM CHLORIDE CRYS ER 20 MEQ PO TBCR
40.0000 meq | EXTENDED_RELEASE_TABLET | Freq: Once | ORAL | Status: AC
  Administered 2021-10-20: 40 meq via ORAL
  Filled 2021-10-20: qty 2

## 2021-10-20 MED ORDER — LEVOTHYROXINE SODIUM 75 MCG PO TABS
75.0000 ug | ORAL_TABLET | Freq: Every day | ORAL | Status: DC
  Administered 2021-10-20 – 2021-10-21 (×2): 75 ug via ORAL
  Filled 2021-10-20 (×2): qty 1

## 2021-10-20 MED ORDER — IOHEXOL 9 MG/ML PO SOLN
500.0000 mL | ORAL | Status: AC
  Administered 2021-10-20 (×2): 500 mL via ORAL

## 2021-10-20 MED ORDER — GABAPENTIN 100 MG PO CAPS: 100.0000 mg | ORAL_CAPSULE | Freq: Three times a day (TID) | ORAL | Status: DC | PRN

## 2021-10-20 MED ORDER — ASPIRIN 81 MG PO TBEC
81.0000 mg | DELAYED_RELEASE_TABLET | Freq: Every evening | ORAL | Status: DC
  Administered 2021-10-20: 81 mg via ORAL
  Filled 2021-10-20: qty 1

## 2021-10-20 MED ORDER — INSULIN ASPART PROT & ASPART (70-30 MIX) 100 UNIT/ML ~~LOC~~ SUSP
25.0000 [IU] | Freq: Two times a day (BID) | SUBCUTANEOUS | Status: DC
  Administered 2021-10-20 – 2021-10-21 (×2): 25 [IU] via SUBCUTANEOUS
  Filled 2021-10-20: qty 10

## 2021-10-20 MED ORDER — IPRATROPIUM-ALBUTEROL 0.5-2.5 (3) MG/3ML IN SOLN: 3.0000 mL | Freq: Four times a day (QID) | RESPIRATORY_TRACT | Status: DC | PRN

## 2021-10-20 MED ORDER — PANTOPRAZOLE SODIUM 40 MG PO TBEC
40.0000 mg | DELAYED_RELEASE_TABLET | Freq: Every day | ORAL | Status: DC
  Administered 2021-10-20 – 2021-10-21 (×2): 40 mg via ORAL
  Filled 2021-10-20 (×2): qty 1

## 2021-10-20 MED ORDER — CLOPIDOGREL BISULFATE 75 MG PO TABS
75.0000 mg | ORAL_TABLET | Freq: Every day | ORAL | Status: DC
  Administered 2021-10-20 – 2021-10-21 (×2): 75 mg via ORAL
  Filled 2021-10-20 (×3): qty 1

## 2021-10-20 MED ORDER — METHYLPREDNISOLONE SODIUM SUCC 40 MG IJ SOLR
40.0000 mg | Freq: Every day | INTRAMUSCULAR | Status: DC
Start: 2021-10-20 — End: 2021-10-21
  Administered 2021-10-20 – 2021-10-21 (×2): 40 mg via INTRAVENOUS
  Filled 2021-10-20 (×2): qty 1

## 2021-10-20 MED ORDER — IOHEXOL 9 MG/ML PO SOLN
ORAL | Status: AC
  Filled 2021-10-20: qty 1000

## 2021-10-20 MED ORDER — METOPROLOL SUCCINATE ER 25 MG PO TB24
25.0000 mg | ORAL_TABLET | Freq: Every day | ORAL | Status: DC
  Administered 2021-10-20 – 2021-10-21 (×2): 25 mg via ORAL
  Filled 2021-10-20 (×2): qty 1

## 2021-10-20 NOTE — Assessment & Plan Note (Signed)
Creatinine of 1.6 up from 1.16. Has received 1.5L of IV fluid in ED.Will hold on any further fluid tonight given heart likelihood of him to be overloaded with his CHF -Follow creatinine in the morning

## 2021-10-20 NOTE — Assessment & Plan Note (Signed)
Appears euvolemic on exam although has AKI so could be over diuresis. Hold Lasix for now.

## 2021-10-20 NOTE — Assessment & Plan Note (Signed)
Questionable whether sacrodosis flare up or if dyspnea more cardiogenic in nature. -duoneb PRN -Start IV steroid

## 2021-10-20 NOTE — Progress Notes (Signed)
PROGRESS NOTE [name]  JJO:[id number] DOB: 09-Jan-1946 DOA: 10/19/2021 PCP: Shirline Frees, MD   Brief Narrative/Hospital Course: 76 y.o.m w/ medical history significant of CAD s/p stents, HFpEF, sarcoidosis on chronic prednisone, insulin dependent T2DM,, CKD 3a, hypothyroidism, recent admission 7/28-8/3 for LLL pneumonia/acute on chronic diastolic CHF who presents with worsening shortness of breath and chest pain.  Since discharge patient has been having increasing shortness of breath can barely walk across his house and also has left chest pressure worse at night.No LE edema and thinks he actually lost 5lbs in the past week. Saw PCP last week and advised to go to ED but declined and took Z-pak instead which did not help. Followed up with PCP with persistent symptoms and finally decide to come in.  In the ED,afebrile, normotensive on room. No leukocytosis.  Hemoglobin of 11.1 with baseline of 9-10.Creatinine of 1.6 up from 1.16.  CBG of 527 with normal CO2 and normal anion gap. BNP of 118.  Troponin at 11x2.CTA chest obtained showed no pulmonary embolism.  Previous left lower lobe pneumonia appears to be resolving. He was given IV fluids, DuoNeb and hospitalist was then called for admission.  Patient also endorsed that he was dizzy and fell on Saturday, has been having nausea abdominal discomfort since at times some cough but mostly has dyspnea on exertion and feeling extremely exhausted  Subjective: Seen and examined this morning, complains of some abdominal soreness on the mid abdomen, has been having dyspnea on exertion shortness of breath ever since his pneumonia Wife is at the bedside   Assessment and Plan: Principal Problem:   Coronary artery disease with exertional angina ([place]) Active Problems:   Sarcoidosis   Chronic diastolic heart failure ([place])   Uncontrolled type 2 diabetes mellitus with hyperglycemia, with long-term current use of insulin (HCC)   Hypokalemia   Hypothyroidism    Acute renal failure superimposed on stage 3a chronic kidney disease (HCC)   HTN (hypertension)   Shortness of breath Chest pain Coronary artery disease with exertional angina Recent admission for pneumonia and CHF, CT showed improving pneumonia, no PE.  Symptoms likely cardiogenic-has had 4 or 5 PCI procedure with stent to the proximal LAD, balloon angioplasty of a diagonal, stenting to the PDA branch of the right coronary, stenting to the posterior lateral branch of the right coronary, stent to proximal right coronary.He has been following cardiology for persistent fatigue/dyspnea that could be his anginal equivalent.  Back in June, he was recommended to have a left heart cath with possible PCI.  Unfortunately, since then he has been hospitalized for numerous other issues.  Troponin negative x2 , EKG with PVC and IVCD on my review which is similar to prior.  Cardiology consulted, continue aspirin, Plavix, hydralazine. Obtain a CT abdomen for completeness as below  Nausea/abdominal discomfort: Previous history of cholecystectomy.  Obtain CT abdomen with oral contrast  ZSW:FUXNATFTDD on hydralazine.  AKI on CKD3a:Creatinine of 1.6 up from 1.16.Has received 1.5L of IV fluid in ED.Will hold on any further fluid tonight given heart likelihood of him to be overloaded with his CHF.  Improving Recent Labs  Lab 10/19/21 1717 10/20/21 0411  BUN 35* 25*  CREATININE 1.60* 1.25*     Hypothyroidism:Continue levothyroxine  Uncontrolled type 2 diabetes mellitus with hyperglycemia, with long-term current use of insulin CBG >500 with normal CO2/anion gap. Normally on 70/30 Novolog 35units with meal. Forgot to take insulin with his lunch 8/15- cont moderate SSI ACHS-consult DM coordinator Recent Labs  Lab  10/19/21 2021 10/20/21 0749 10/20/21 1133  GLUCAP 360* 212* 223*     Chronic diastolic heart failure:Appears euvolemic on exam although has AKI so could be over diuresis. Hold Lasix for  now.  Sarcoidosis:Questionable whether sacrodosis flare up or if dyspnea more cardiogenic in nature. -duoneb PRN, trial of IV steroid  Hypokalemia replete  DVT prophylaxis: enoxaparin (LOVENOX) injection 40 mg Start: 10/19/21 2230 Code Status:   Code Status: Full Code Family Communication: plan of care discussed with patient/wife at bedside. Patient status is: Admitted as observation but remain hospitalized because of ongoing evaluation of shortness of breath abdominal pain Level of care: Telemetry   Dispo: The patient is from: home            Anticipated disposition: home in 1-2 days  Mobility Assessment (last 72 hours)     Mobility Assessment   No documentation.            Objective: Vitals last 24 hrs: Vitals:   10/20/21 0755 10/20/21 0817 10/20/21 0934 10/20/21 1030  BP: (!) 145/77  139/80 120/72  Pulse: 78   69  Resp: (!) 23   15  Temp:  98.1 F (36.7 C)    TempSrc:      SpO2: 92%   93%  Weight:      Height:       Weight change:   Physical Examination:  General exam: alert awake,older than stated age, weak appearing. HEENT:Oral mucosa moist, Ear/Nose WNL grossly, dentition normal. Respiratory system: bilaterally diminished BS, no use of accessory muscle Cardiovascular system: S1 & S2 +, No JVD. Gastrointestinal system: Abdomen soft, mildly tender mid abdomen,ND, BS+ Nervous System:Alert, awake, moving extremities and grossly nonfocal Extremities: LE edema neg,distal peripheral pulses palpable.  Skin: No rashes,no icterus. MSK: Normal muscle bulk,tone, power  Medications reviewed:  Scheduled Meds:  aspirin EC  81 mg Oral QPM   clopidogrel  75 mg Oral Daily   enoxaparin (LOVENOX) injection  40 mg Subcutaneous Q24H   hydrALAZINE  10 mg Oral TID   insulin aspart  0-15 Units Subcutaneous TID PC & HS   levothyroxine  75 mcg Oral Daily   methylPREDNISolone (SOLU-MEDROL) injection  40 mg Intravenous Daily   pantoprazole  40 mg Oral Daily   Continuous  Infusions:    Diet Order             Diet Heart Room service appropriate? Yes; Fluid consistency: Thin  Diet effective now                            Intake/Output Summary (Last 24 hours) at 10/20/2021 1156 Last data filed at 10/20/2021 0418 Gross per 24 hour  Intake 1500 ml  Output --  Net 1500 ml   Net IO Since Admission: 1,500 mL [10/20/21 1156]  Wt Readings from Last 3 Encounters:  10/19/21 91.6 kg  10/01/21 96.9 kg  09/13/21 92.4 kg     Unresulted Labs (From admission, onward)    None     Data Reviewed: I have personally reviewed following labs and imaging studies CBC: Recent Labs  Lab 10/19/21 1716 10/20/21 0411  WBC 10.4 10.8*  NEUTROABS 8.7*  --   HGB 11.1* 11.0*  HCT 36.7* 36.0*  MCV 88.6 88.5  PLT 321 387   Basic Metabolic Panel: Recent Labs  Lab 10/19/21 1717 10/20/21 0411  NA 134* 139  K 4.1 3.2*  CL 100 101  CO2 23 29  GLUCOSE  527* 279*  BUN 35* 25*  CREATININE 1.60* 1.25*  CALCIUM 8.4* 8.3*   GFR: Estimated Creatinine Clearance: 58.1 mL/min (A) (by C-G formula based on SCr of 1.25 mg/dL (H)). Liver Function Tests: Recent Labs  Lab 10/19/21 1717  AST 20  ALT 18  ALKPHOS 84  BILITOT 0.5  PROT 5.9*  ALBUMIN 3.0*   No results for input(s): "LIPASE", "AMYLASE" in the last 168 hours. No results for input(s): "AMMONIA" in the last 168 hours. Coagulation Profile: No results for input(s): "INR", "PROTIME" in the last 168 hours. BNP (last 3 results) No results for input(s): "PROBNP" in the last 8760 hours. HbA1C: No results for input(s): "HGBA1C" in the last 72 hours. CBG: Recent Labs  Lab 10/19/21 2021 10/20/21 0749 10/20/21 1133  GLUCAP 360* 212* 223*   Lipid Profile: No results for input(s): "CHOL", "HDL", "LDLCALC", "TRIG", "CHOLHDL", "LDLDIRECT" in the last 72 hours. Thyroid Function Tests: No results for input(s): "TSH", "T4TOTAL", "FREET4", "T3FREE", "THYROIDAB" in the last 72 hours. Sepsis Labs: No results  for input(s): "PROCALCITON", "LATICACIDVEN" in the last 168 hours.  No results found for this or any previous visit (from the past 240 hour(s)).  Antimicrobials: Anti-infectives (From admission, onward)    None      Culture/Microbiology    Component Value Date/Time   SDES IN/OUT CATH URINE 10/01/2021 0650   SPECREQUEST  10/01/2021 0650    NONE Performed at [facility] [address], [place], [place] [address]    CULT MULTIPLE SPECIES PRESENT, SUGGEST RECOLLECTION (A) 10/01/2021 0650   REPTSTATUS 10/02/2021 FINAL 10/01/2021 0650    Other culture-see note  Radiology Studies: CT Angio Chest PE W and/or Wo Contrast  Result Date: 10/19/2021 CLINICAL DATA:  Pulmonary embolus suspected EXAM: CT ANGIOGRAPHY CHEST WITH CONTRAST TECHNIQUE: Multidetector CT imaging of the chest was performed using the standard protocol during bolus administration of intravenous contrast. Multiplanar CT image reconstructions and MIPs were obtained to evaluate the vascular anatomy. RADIATION DOSE REDUCTION: This exam was performed according to the departmental dose-optimization program which includes automated exposure control, adjustment of the mA and/or kV according to patient size and/or use of iterative reconstruction technique. CONTRAST:  51m OMNIPAQUE IOHEXOL 350 MG/ML SOLN COMPARISON:  Chest CTA dated October 01, 2021; chest CT dated May 06, 2019 FINDINGS: Cardiovascular: No evidence of pulmonary embolus, . Normal heart size. No pericardial effusion. Moderate coronary artery calcifications. Normal caliber thoracic aorta with moderate atherosclerotic disease. Mediastinum/Nodes: Esophagus and thyroid are unremarkable. Calcified right hilar lymph nodes, likely sequela of prior granulomatous infection. No pathologically enlarged lymph nodes seen in the chest. Lungs/Pleura: Central airways are patent. Interval decreased bilateral bronchial wall thickening and near-complete resolution of left lower lobe  consolidation with mild residual ground-glass opacities seen in the left lower lobe on today's exam. Subpleural solid pulmonary nodule of the right lower lobe measuring 5 mm in mean diameter on series 6, image 66 and solid pulmonary nodule of the right upper lobe measuring 5 mm in mean diameter on image 76, nodules are stable when compared with 2021 prior exam, no further follow-up is needed given greater than 1 year stability. Upper Abdomen: No acute abnormality. Musculoskeletal: No chest wall abnormality. No acute or significant osseous findings. Review of the MIP images confirms the above findings. IMPRESSION: 1. No evidence of pulmonary embolus. 2. Interval decreased bilateral bronchial wall thickening and near-complete resolution of left lower lobe consolidation with mild residual ground-glass opacities seen in the left lower lobe on today's exam  compatible with resolving bronchopneumonia. 3. Coronary artery calcifications and aortic Atherosclerosis (ICD10-I70.0). Electronically Signed   By: Yetta Glassman M.D.   On: 10/19/2021 20:12   DG Chest 2 View  Result Date: 10/19/2021 CLINICAL DATA:  Chest pain and shortness of breath with recent admission for pneumonia. History of sarcoidosis. EXAM: CHEST - 2 VIEW COMPARISON:  10/01/2021 plain film and CT. Remote chest radiograph of 08/28/2019 also reviewed. FINDINGS: Midline trachea. Mild cardiomegaly. No pleural effusion or pneumothorax. Improved bibasilar aeration since 10/01/2021. Residual interstitial thickening and bibasilar scarring are relatively similar to 08/28/2019. IMPRESSION: Improved bibasilar aeration, with scarring, felt to be similar to 08/28/2019. Basilar predominant interstitial thickening, unchanged and nonspecific. Electronically Signed   By: Abigail Miyamoto M.D.   On: 10/19/2021 16:27     LOS: 0 days   Antonieta Pert, MD [facility]  10/20/2021, 11:56 AM

## 2021-10-20 NOTE — ED Notes (Signed)
Pt received lunch tray 

## 2021-10-20 NOTE — Inpatient Diabetes Management (Signed)
Inpatient Diabetes Program Recommendations  AACE/ADA: New Consensus Statement on Inpatient Glycemic Control (2015)  Target Ranges:  Prepandial:   less than 140 mg/dL      Peak postprandial:   less than 180 mg/dL (1-2 hours)      Critically ill patients:  140 - 180 mg/dL   Lab Results  Component Value Date   GLUCAP 223 (H) 10/20/2021   HGBA1C 10.0 (H) 10/04/2021    Review of Glycemic Control  Diabetes history: DM2 Outpatient Diabetes medications: 70/30 35 units BID or TID (whenever pt eats meal) Current orders for Inpatient glycemic control: Novolog 0-15 tiD with meals and 0-5 hs On Solumedrol 40 QD  Needs basal insulin. Spoke with pt at bedside. Pt prefers to be on 70/30 insulin. Said lanus/semglee doesn't work for him.  Inpatient Diabetes Program Recommendations:    70/30 25 units TID  Increase Novolog to 0-20 units TID with meals and 0-5 HS  Add CHO mod to heart healthy diet  Will secure text to MD.  Thank you. Lorenda Peck, RD, LDN, [facility] Inpatient Diabetes Coordinator [phone]

## 2021-10-20 NOTE — Assessment & Plan Note (Signed)
Continue levothyroxine 

## 2021-10-20 NOTE — Consult Note (Addendum)
Cardiology Consultation:   Patient ID: [name] MRN: [id number]; DOB: 28-Apr-1945  Admit date: 10/19/2021 Date of Consult: 10/20/2021  PCP:  Shirline Frees, MD   [facility] Providers Cardiologist:  Candee Furbish, MD   {   Patient Profile:   [name] is a 76 y.o. male with a history of multivessel CAD s/p multiple PCIs (DES to LAD and PLA in 10/2009, DES x2 to the Clio in 08/2018, and most recently DES to proximal RCA, DES to mid RCA, and DES to 1st Diag), chronic diastolic CHF, pulmonary sarcoidosis, PAD with left tibial disease (treated medically), hypertension, hyperlipidemia, adrenal insufficiency on chronic prednisone, anemia with prior GI bleed, obstructive sleep apnea, CKD stage III, and low back pain s/p recent lumbar spine laminectomy and fusion who is being seen 10/20/2021 for the evaluation of anemia at the request of Kc  History of Present Illness:   [name] is a 76 year old male with the above history who is followed by Dr. Marlou Porch.  He has a history of multivessel CAD s/p DES to LAD and PLA in 10/2009 and DES x2 to the R PDA in 08/2018.  Last heart cath in 12/2020 showed diffuse three-vessel disease with moderate LAD disease beyond the stented segment, moderately severe diagonal disease, moderate disease less than or equal to 70% of the ramus, and new 75 to 80% stenosis of the proximal RCA as well as 60% stenosis of the mid to distal RCA.  RFR of the RCA was 0.91 suggesting of ischemia.  Continue medical therapy was recommended at that time.  However, he had recurrent chest pain following this and ended up having repeat cardiac catheterization in 11/22 at which time he underwent successful PCI with DES to the proximal RCA, DES to the mid RCA, and DES to first diagonal.  Patient was last seen by Dr. Marlou Porch in 04/2021 at which time he did report he was still having some twisting sharp chest pain at times which was felt to possibly be musculoskeletal in nature.  He did note some  improvement in her shortness of breath following most recent PCI.  Continue medical therapy was recommended.  Since this visit, it looks like he has been seeing cardiology at [facility].  He was last seen Per in 08/2021 at which time he denied any chest pain but reported getting tired and out of breath easily.  He felt like his dyspnea was exactly the same as his angina in the past.  Therefore, he underwent another cardiac catheterization which showed nonobstructive CAD with diffuse small vessel disease but no targets for PCI.  Patient was admitted [facility] from 09/12/2021 to 09/16/2021 for intractable low back pain.  MRI lumbar spine showed showed  anterolisthesis, disc bulge and right foraminal disc extrusion at L4-5 resulting in mild to moderate spinal canal stenosis and severe bilateral neural foraminal narrowing, greater on the right side, with impingement of the bilateral L4 nerve roots.  Neurosurgery recommended steroid taper and outpatient surgical evaluation.  Hospitalization was complicated by acute encephalopathy felt to be from pain medications and hypotension felt to be due to adrenal insufficiency, dehydration, and opiates as well as AKI which was also felt to be from hypotension.  Patient was readmitted from 09/22/2021 to 09/25/2021 for recurrent low back pain and was ultimately taken to the OR on 09/24/2021 and underwent L4-L5 only invasive laminectomy and transforaminal lumbar interbody fusion.  He tolerated this procedure well and was able to be discharged the following day.  He  was then readmitted from 10/01/2021 to 10/07/2021 for community-acquired pneumonia after presenting with cough and chest wall pain.  There was also concern for a sarcoidosis flare and a CHF component.  He was treated with IV antibiotics, steroids, and IV Lasix with improvement.  He returned to the ED on 10/19/2021 for chest pain and shortness of breath. EKG normal sinus rhythm, rate 88 bpm, with PVC and nonspecific IVCD and no acute  ST/T changes.  High-sensitivity troponin negative x2. BNP minimally elevated at 118 (improved from 141 during last admission earlier this month). Chest x-ray showed improved bibasilar aeration with scarring felt to be similar to prior imaging in 08/2019 as well as unchanged basilar predominant interstitial thickening.  CTA was negative for PE and showed interval decrease bilateral bronchial wall thickening and near complete resolution of left lower lobe consolidation with mild residual groundglass opacity seen in the left lower lobe compatible with resolving bronchopneumonia.  WBC 10.4, Hgb 11.1, Plts 321. Na 134, Glucose 527, BUN 35, Cr 1.60. Albumin 5.9, AST 20, ALT 18, Alk Phos 84, Total Bili 0.5.  He is given IV fluids and DuoNeb in the ED and was admitted for further evaluation.  Cardiology consulted for assistance.  At the time of this evaluation, patient is resting comfortably in no acute distress.  When asked him what brought him back to the hospital, he states I just cannot get over this pneumonia.  He reports persistent coughing and shortness of breath as well as some chest tightness.  Feels like the biggest issue is that he just cannot cough up any phlegm and it is stuck in his chest.  He states he thinks he would feel better if he could cough this up.  He has chronic shortness of breath with very minimal exertion as well as intermittent chest tightness with exertion but it sounds like this is unchanged from his most recent heart cath in 08/24/2021.  He is also concerned that some of his chest tightness may be due to his significant coughing episodes.  He reports coughing at night when he is laying down flat and also reports occasionally waking up feeling he cannot breathe.  He has obstructive sleep apnea but states his CPAP machine is very old and he thinks he needs a new one.  He has some mild lower extremity edema but states this is actually been better the past couple of days.  He denies any  palpitations.  He did have an episode of lightheadedness on Saturday 10/16/2021 that occurred while he was ambulating with a walker.  He states he got lightheaded, nauseous, and a weak and fell to the ground because his legs gave out on him.  He denies any syncope.  He reports some shortness of breath and chest pain after he fell when he was working to get himself back up but none prior to the event.  He also reports significant fatigue but this is not new.  Sounds like deconditioning is playing a role as well as he was essentially bedbound for 3 weeks due to his back.  He denies any fevers last hospitalization.  States he has felt nauseous since Saturday but denies any vomiting.  No abnormal bleeding in urine or stools.  Past Medical History:  Diagnosis Date   Adrenal insufficiency ([place])    Allergy    Anemia    Arthritis    feet    Broken foot 09/2019   had to wore a boot. Left    CKD (chronic  kidney disease), stage III ([place])    Coronary artery disease    has stents   Coronary atherosclerosis of native coronary artery    Proximal LAD, posterior lateral stent widely patent-10/12/11   Diabetes mellitus    insulin and pills   Hearing loss    wears hearing aids   History of blood transfusion 06/30/2016   [facility] - 2 units transfused   Hypertension    OSA (obstructive sleep apnea)    uses VPAC sleep study 2 years done through [facility]. Dr. Marlou Porch arranged study   Sarcoidosis    Sleep apnea    uses CPAP nightly   Thyroid disease    Type 2 diabetes mellitus [facility])     Past Surgical History:  Procedure Laterality Date   APPENDECTOMY     CATARACT EXTRACTION  2011   bilat   CHOLECYSTECTOMY  01/25/2011   Procedure: LAPAROSCOPIC CHOLECYSTECTOMY WITH INTRAOPERATIVE CHOLANGIOGRAM;  Surgeon: Judieth Keens, DO;  Location: [place];  Service: General;  Laterality: N/A;   COLONOSCOPY     several   COLONOSCOPY WITH PROPOFOL N/A 01/03/2020   Procedure: COLONOSCOPY WITH PROPOFOL;  Surgeon:  Gatha Mayer, MD;  Location: [facility];  Service: Endoscopy;  Laterality: N/A;   CORONARY ANGIOPLASTY     most recent 11/2009   CORONARY STENT INTERVENTION N/A 08/07/2018   Procedure: CORONARY STENT INTERVENTION;  Surgeon: Jettie Booze, MD;  Location: [place] CV [facility];  Service: Cardiovascular;  Laterality: N/A;   CORONARY STENT INTERVENTION N/A 01/18/2021   Procedure: CORONARY STENT INTERVENTION;  Surgeon: Sherren Mocha, MD;  Location: [place] [facility];  Service: Cardiovascular;  Laterality: N/A;   CORONARY STENT PLACEMENT  2009   in LAD and side branch PTCA   ESOPHAGOGASTRODUODENOSCOPY (EGD) WITH PROPOFOL N/A 01/03/2020   Procedure: ESOPHAGOGASTRODUODENOSCOPY (EGD) WITH PROPOFOL;  Surgeon: Gatha Mayer, MD;  Location: [facility];  Service: Endoscopy;  Laterality: N/A;   HEMOSTASIS CLIP PLACEMENT  01/03/2020   Procedure: HEMOSTASIS CLIP PLACEMENT;  Surgeon: Gatha Mayer, MD;  Location: [facility];  Service: Endoscopy;;   INTERCOSTAL NERVE BLOCK  2011, 06/2016   x2. lumbar spine   INTRAVASCULAR PRESSURE WIRE/FFR STUDY N/A 01/18/2021   Procedure: INTRAVASCULAR PRESSURE WIRE/FFR STUDY;  Surgeon: Sherren Mocha, MD;  Location: [place] [facility];  Service: Cardiovascular;  Laterality: N/A;   LEFT HEART CATHETERIZATION WITH CORONARY ANGIOGRAM Bilateral 10/12/2011   Procedure: LEFT HEART CATHETERIZATION WITH CORONARY ANGIOGRAM;  Surgeon: Candee Furbish, MD;  Location: [facility];  Service: Cardiovascular;  Laterality: Bilateral;   LEFT HEART CATHETERIZATION WITH CORONARY ANGIOGRAM N/A 04/01/2014   Procedure: LEFT HEART CATHETERIZATION WITH CORONARY ANGIOGRAM;  Surgeon: Candee Furbish, MD;  Location: [facility] C[facility] LAB;  Service: Cardiovascular;  Laterality: N/A;   POLYPECTOMY  01/03/2020   Procedure: POLYPECTOMY;  Surgeon: Gatha Mayer, MD;  Location: [facility];  Service: Endoscopy;;   RIGHT/LEFT HEART CATH AND CORONARY ANGIOGRAPHY N/A 08/07/2018   Procedure: RIGHT/LEFT HEART CATH  AND CORONARY ANGIOGRAPHY;  Surgeon: Jettie Booze, MD;  Location: [place] [facility];  Service: Cardiovascular;  Laterality: N/A;   RIGHT/LEFT HEART CATH AND CORONARY ANGIOGRAPHY N/A 12/25/2020   Procedure: RIGHT/LEFT HEART CATH AND CORONARY ANGIOGRAPHY;  Surgeon: Belva Crome, MD;  Location: [place] [facility];  Service: Cardiovascular;  Laterality: N/A;   TRANSFORAMINAL LUMBAR INTERBODY FUSION W/ MIS 1 LEVEL N/A 09/24/2021   Procedure: LUMBAR FOUR-FIVE MINIMALLY INVASIVE (MIS) TRANSFORAMINAL LUMBAR INTERBODY FUSION (TLIF) WITH METRX;  Surgeon: Emelda Brothers  A, MD;  Location: [place];  Service: Neurosurgery;  Laterality: N/A;     Home Medications:  Prior to Admission medications   Medication Sig Start Date End Date Taking? Authorizing Provider  acetaminophen (TYLENOL) 500 MG tablet Take 1,000 mg by mouth daily as needed for mild pain (pain).   Yes [provider]  aspirin EC 81 MG tablet Take 81 mg by mouth every evening.    Yes [provider]  Cholecalciferol (VITAMIN D3) 3000 UNITS TABS Take 3,000 Units by mouth daily.   Yes [provider]  clopidogrel (PLAVIX) 75 MG tablet Take 1 tablet (75 mg total) by mouth daily. Restart on 09/28/21 09/25/21  Yes Shah, Pratik D, DO  Coenzyme Q10 (COQ-10) 400 MG CAPS Take 400 mg by mouth 2 (two) times a week. Sunday, Wednesday   Yes [provider]  furosemide (LASIX) 80 MG tablet Take 1 tablet (80 mg total) by mouth daily as needed for edema. Patient taking differently: Take 80 mg by mouth daily. 09/16/21  Yes Vann, Jessica U, DO  gabapentin (NEURONTIN) 100 MG capsule Take 1 capsule (100 mg total) by mouth 3 (three) times daily. Patient taking differently: Take 100 mg by mouth 3 (three) times daily as needed (pain). 09/16/21  Yes Eulogio Bear U, DO  hydrALAZINE (APRESOLINE) 10 MG tablet Take 1 tablet (10 mg total) by mouth every 8 (eight) hours. Patient taking differently: Take 10 mg by mouth 3 (three) times  daily. 09/16/21  Yes Vann, Jessica U, DO  insulin aspart protamine- aspart (NOVOLOG MIX 70/30) (70-30) 100 UNIT/ML injection Inject 0.4 mLs (40 Units total) into the skin with breakfast, with lunch, and with evening meal. Patient taking differently: Inject 25-35 Units into the skin with breakfast, with lunch, and with evening meal. 09/16/21  Yes Vann, Jessica U, DO  levothyroxine (SYNTHROID) 75 MCG tablet Take 75 mcg by mouth daily. 02/24/21  Yes [provider]  montelukast (SINGULAIR) 10 MG tablet Take 10 mg by mouth daily as needed (allergies).  07/18/18  Yes [provider]  Multiple Vitamin (MULTIVITAMIN WITH MINERALS) TABS tablet Take 1 tablet by mouth daily. 10/08/18  Yes Hongalgi, Lenis Dickinson, MD  nitroGLYCERIN (NITROSTAT) 0.4 MG SL tablet Place 1 tablet (0.4 mg total) under the tongue every 5 (five) minutes as needed for chest pain. 07/01/20 11/13/21 Yes Imogene Burn, PA-C  OVER THE COUNTER MEDICATION Take 1 tablet by mouth daily. Omega XL   Yes [provider]  potassium chloride (KLOR-CON M) 10 MEQ tablet Take 10 mEq by mouth daily.   Yes [provider]  predniSONE (DELTASONE) 10 MG tablet Take 10 mg by mouth daily.   Yes [provider]  RABEprazole (ACIPHEX) 20 MG tablet Take 20 mg by mouth daily. 09/28/21  Yes [provider]  albuterol (PROVENTIL) (2.5 MG/3ML) 0.083% nebulizer solution Take 3 mLs (2.5 mg total) by nebulization every 6 (six) hours as needed for wheezing or shortness of breath. Dx Code D86.0 07/24/14   Elsie Stain, MD  Albuterol Sulfate (PROAIR RESPICLICK) 937 (90 BASE) MCG/ACT AEPB Inhale 2 puffs into the lungs every 6 (six) hours as needed. Patient taking differently: Inhale 2 puffs into the lungs as needed (for shortness of breath). 07/24/14   Elsie Stain, MD  ezetimibe (ZETIA) 10 MG tablet Take 10 mg by mouth once a week. Patient not taking: Reported on 10/19/2021 06/25/21   [provider]  lidocaine  (LIDODERM) 5 % Place 1 patch onto the  skin daily as needed for pain. 10/21/20   [provider]  oxyCODONE (OXY IR/ROXICODONE) 5 MG immediate release tablet Take 1 tablet (5 mg total) by mouth every 4 (four) hours as needed for severe pain or breakthrough pain. 09/25/21   Nita Sells, MD  rosuvastatin (CRESTOR) 20 MG tablet Take 20 mg by mouth daily. Patient not taking: Reported on 09/23/2021    [provider]    Inpatient Medications: Scheduled Meds:  aspirin EC  81 mg Oral QPM   clopidogrel  75 mg Oral Daily   enoxaparin (LOVENOX) injection  40 mg Subcutaneous Q24H   hydrALAZINE  10 mg Oral TID   insulin aspart  0-15 Units Subcutaneous TID PC & HS   levothyroxine  75 mcg Oral Daily   methylPREDNISolone (SOLU-MEDROL) injection  40 mg Intravenous Daily   pantoprazole  40 mg Oral Daily   Continuous Infusions:  PRN Meds: gabapentin, ipratropium-albuterol  Allergies:    Allergies  Allergen Reactions   Statins Other (See Comments)    Pt says they make him "mean"   Hydrocortisone Nausea Only   Ranexa [Ranolazine] Other (See Comments)    Severe weakness/near syncope after 1 dose Hallucinations    Hydrocodone Nausea And Vomiting   Miralax [Polyethylene Glycol] Nausea And Vomiting   Z-Pak [Azithromycin] Other (See Comments)    Raises blood sugar   Zaroxolyn [Metolazone] Other (See Comments)    Drained, no energy    Social History:   Social History   Socioeconomic History   Marital status: Married    Spouse name: Not on file   Number of children: 3   Years of education: Not on file   Highest education level: Not on file  Occupational History   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   Occupation: MANAGER    Employer: [employer]  Tobacco Use   Smoking status: Never   Smokeless tobacco: Never  Vaping Use   Vaping Use: Never used  Substance and Sexual Activity   Alcohol use: No   Drug use: No   Sexual activity: Not on file   Other Topics Concern   Not on file  Social History Narrative   Not on file   Social Determinants of Health   Financial Resource Strain: Not on file  Food Insecurity: Not on file  Transportation Needs: Not on file  Physical Activity: Not on file  Stress: Not on file  Social Connections: Not on file  Intimate Partner Violence: Not on file    Family History:   Family History  Problem Relation Age of Onset   Kidney disease Mother    Diabetes Mother    Kidney cancer Mother    Heart attack Father    Asthma Sister    Anesthesia problems Sister        "Kidney's did not wake up"   Sarcoidosis Sister    Sarcoidosis Niece    Colon cancer Neg Hx    Colon polyps Neg Hx    Rectal cancer Neg Hx    Stomach cancer Neg Hx      ROS:  Please see the history of present illness.  Review of Systems  Constitutional:  Negative for chills and fever.  HENT:  Negative for nosebleeds.   Respiratory:  Positive for cough and shortness of breath.   Cardiovascular:  Positive for chest pain, leg swelling and PND. Negative for palpitations and orthopnea.  Gastrointestinal:  Positive for nausea. Negative for blood in stool, melena and  vomiting.  Genitourinary:  Negative for hematuria.  Musculoskeletal:  Positive for back pain and falls.  Neurological:  Negative for loss of consciousness. Dizziness: lightheadedness. Endo/Heme/Allergies:  Does not bruise/bleed easily.  Psychiatric/Behavioral:  Negative for substance abuse.     Physical Exam/Data:   Vitals:   10/20/21 0600 10/20/21 0730 10/20/21 0755 10/20/21 0817  BP: (!) 148/87 (!) 153/76 (!) 145/77   Pulse: 79 74 78   Resp: 20 18 (!) 23   Temp:    98.1 F (36.7 C)  TempSrc:      SpO2: 96% 96% 92%   Weight:      Height:        Intake/Output Summary (Last 24 hours) at 10/20/2021 0923 Last data filed at 10/20/2021 0418 Gross per 24 hour  Intake 1500 ml  Output --  Net 1500 ml      10/19/2021    3:38 PM 10/01/2021    2:58 PM 09/13/2021     5:22 AM  Last 3 Weights  Weight (lbs) 202 lb 213 lb 10 oz 203 lb 9.6 oz  Weight (kg) 91.627 kg 96.9 kg 92.352 kg     Body mass index is 28.98 kg/m.  General: 76 y.o. Caucasian male resting comfortably in no acute distress. HEENT: Normocephalic and atraumatic. Sclera clear.  Neck: Supple. JVD difficult to assess due to neck size but none appreciated.  Heart: Irregular rhythm with normal rate. Distinct S1 and S2. No murmurs, gallops, or rubs. Radial pulses 2+ and equal bilaterally. Lungs: No increased work of breathing. Clear to ausculation bilaterally. No wheezes, rhonchi, or rales.  Abdomen: Soft, non-distended, and non-tender to palpation. Bowel sounds present. Extremities: Mild edema around ankles bilaterally.   Skin: Warm and dry. Neuro: Alert and oriented x3. No focal deficits. Psych: Normal affect. Responds appropriately.   EKG:  The EKG was personally reviewed and demonstrates:  Normal sinus rhythm, rate 88 bpm, with PVC and nonspecific IVCD.  Some underlying artifact but no acute ST/T changes.  LVH.  Left axis deviation.  QTc 457 ms. Telemetry:  Telemetry was personally reviewed and demonstrates: Normal sinus rhythm with frequent PVCs and ventricular couplets as well as short runs of NSVT around 3 beats.  Also has some runs of an accelerated idioventricular rhythm with rates in the 80s.  Relevant CV Studies:  Cardiac Catheterization 01/18/2021:   Prox LAD-1 lesion is 40% stenosed.   Prox LAD-2 lesion is 65% stenosed.   Mid Cx lesion is 50% stenosed.   Mid RCA lesion is 60% stenosed.   Prox RCA lesion is 75% stenosed.   Ost Ramus to Ramus lesion is 70% stenosed.   Ost 1st Diag lesion is 80% stenosed.   Non-stenotic Ost RPDA lesion was previously treated.   Non-stenotic Ost RPDA to RPDA lesion was previously treated.   Non-stenotic RPAV lesion was previously treated.   A drug-eluting stent was successfully placed using a STENT ONYX FRONTIER 3.5X15.   A drug-eluting stent  was successfully placed using a STENT ONYX FRONTIER 3.5X18.   A drug-eluting stent was successfully placed using a STENT ONYX FRONTIER 2.5X18.   Post intervention, there is a 0% residual stenosis.   Post intervention, there is a 0% residual stenosis.   Post intervention, there is a 0% residual stenosis.   Successful two-vessel PCI guided by RFR/FFR with stenting of severe stenoses in the proximal and mid RCA and severe stenosis in the first diagonal branch of the LAD.  All lesions treated with Onyx frontier drug-eluting  stents (total of 3 stents implanted).  Patient should be treated with dual antiplatelet therapy with aspirin and clopidogrel for minimum of 6 months, favor long-term therapy if well-tolerated considering his multivessel stenting.  Same-day PCI protocol.  Diagnostic Dominance: Right  Intervention   _______________  Echocardiogram 05/13/2021: Impressions: 1. Left ventricular ejection fraction, by estimation, is 55 to 60%. The  left ventricle has normal function. The left ventricle has no regional  wall motion abnormalities. Left ventricular diastolic parameters are  indeterminate. Elevated left ventricular  end-diastolic pressure.   2. Right ventricular systolic function is normal. The right ventricular  size is normal.   3. The mitral valve is normal in structure. No evidence of mitral valve  regurgitation. No evidence of mitral stenosis.   4. The aortic valve is tricuspid. Aortic valve regurgitation is not  visualized. Aortic valve sclerosis/calcification is present, without any  evidence of aortic stenosis.   5. The inferior vena cava is normal in size with greater than 50%  respiratory variability, suggesting right atrial pressure of 3 mmHg. _______________  Cardiac Catheterization 08/10/2021 ([facility]):  Prox Cx to Mid Cx lesion is 50% stenosed.    Prox LAD lesion is 50% stenosed.    Prox LAD to Mid LAD lesion is 15% stenosed.    1st Diag-1 lesion is 40% stenosed.     1st Diag-2 lesion is 20% stenosed.    RPAV lesion is 15% stenosed.    Prox RCA lesion is 20% stenosed.    Mid RCA lesion is 20% stenosed.   The imaging is on file and stored in a permanent location.   Laboratory Data:  High Sensitivity Troponin:   Recent Labs  Lab 10/01/21 0455 10/01/21 0650 10/19/21 1717 10/19/21 1855  TROPONINIHS 64* 40* 11 11     Chemistry Recent Labs  Lab 10/19/21 1717 10/20/21 0411  NA 134* 139  K 4.1 3.2*  CL 100 101  CO2 23 29  GLUCOSE 527* 279*  BUN 35* 25*  CREATININE 1.60* 1.25*  CALCIUM 8.4* 8.3*  GFRNONAA 45* >60  ANIONGAP 11 9    Recent Labs  Lab 10/19/21 1717  PROT 5.9*  ALBUMIN 3.0*  AST 20  ALT 18  ALKPHOS 84  BILITOT 0.5   Lipids No results for input(s): "CHOL", "TRIG", "HDL", "LABVLDL", "LDLCALC", "CHOLHDL" in the last 168 hours.  Hematology Recent Labs  Lab 10/19/21 1716 10/20/21 0411  WBC 10.4 10.8*  RBC 4.14* 4.07*  HGB 11.1* 11.0*  HCT 36.7* 36.0*  MCV 88.6 88.5  MCH 26.8 27.0  MCHC 30.2 30.6  RDW 20.9* 20.7*  PLT 321 281   Thyroid No results for input(s): "TSH", "FREET4" in the last 168 hours.  BNP Recent Labs  Lab 10/19/21 1717  BNP 118.7*    DDimer No results for input(s): "DDIMER" in the last 168 hours.   Radiology/Studies:  CT Angio Chest PE W and/or Wo Contrast  Result Date: 10/19/2021 CLINICAL DATA:  Pulmonary embolus suspected EXAM: CT ANGIOGRAPHY CHEST WITH CONTRAST TECHNIQUE: Multidetector CT imaging of the chest was performed using the standard protocol during bolus administration of intravenous contrast. Multiplanar CT image reconstructions and MIPs were obtained to evaluate the vascular anatomy. RADIATION DOSE REDUCTION: This exam was performed according to the departmental dose-optimization program which includes automated exposure control, adjustment of the mA and/or kV according to patient size and/or use of iterative reconstruction technique. CONTRAST:  78m OMNIPAQUE IOHEXOL 350 MG/ML  SOLN COMPARISON:  Chest CTA dated October 01, 2021; chest CT  dated May 06, 2019 FINDINGS: Cardiovascular: No evidence of pulmonary embolus, . Normal heart size. No pericardial effusion. Moderate coronary artery calcifications. Normal caliber thoracic aorta with moderate atherosclerotic disease. Mediastinum/Nodes: Esophagus and thyroid are unremarkable. Calcified right hilar lymph nodes, likely sequela of prior granulomatous infection. No pathologically enlarged lymph nodes seen in the chest. Lungs/Pleura: Central airways are patent. Interval decreased bilateral bronchial wall thickening and near-complete resolution of left lower lobe consolidation with mild residual ground-glass opacities seen in the left lower lobe on today's exam. Subpleural solid pulmonary nodule of the right lower lobe measuring 5 mm in mean diameter on series 6, image 66 and solid pulmonary nodule of the right upper lobe measuring 5 mm in mean diameter on image 76, nodules are stable when compared with 2021 prior exam, no further follow-up is needed given greater than 1 year stability. Upper Abdomen: No acute abnormality. Musculoskeletal: No chest wall abnormality. No acute or significant osseous findings. Review of the MIP images confirms the above findings. IMPRESSION: 1. No evidence of pulmonary embolus. 2. Interval decreased bilateral bronchial wall thickening and near-complete resolution of left lower lobe consolidation with mild residual ground-glass opacities seen in the left lower lobe on today's exam compatible with resolving bronchopneumonia. 3. Coronary artery calcifications and aortic Atherosclerosis (ICD10-I70.0). Electronically Signed   By: Yetta Glassman M.D.   On: 10/19/2021 20:12   DG Chest 2 View  Result Date: 10/19/2021 CLINICAL DATA:  Chest pain and shortness of breath with recent admission for pneumonia. History of sarcoidosis. EXAM: CHEST - 2 VIEW COMPARISON:  10/01/2021 plain film and CT. Remote chest radiograph of  08/28/2019 also reviewed. FINDINGS: Midline trachea. Mild cardiomegaly. No pleural effusion or pneumothorax. Improved bibasilar aeration since 10/01/2021. Residual interstitial thickening and bibasilar scarring are relatively similar to 08/28/2019. IMPRESSION: Improved bibasilar aeration, with scarring, felt to be similar to 08/28/2019. Basilar predominant interstitial thickening, unchanged and nonspecific. Electronically Signed   By: Abigail Miyamoto M.D.   On: 10/19/2021 16:27     Assessment and Plan:   Chest Pain and Dyspnea on Exertion CAD Patient has a long history of multivessel CAD with multiple PCIs most recently in 01/2021. Most recent cardiac catheterization at [facility] in 08/2021 showed nonobstructive CAD with diffuse small vessel disease but no targets for PCI. He now presents with chest pain and dyspnea after recent admission for CAP, pulmonary sarcoidosis flare, and acute on chronic CHF. EKG shows no acute ischemic changes. High-sensitivity troponin negative x2.   -Currently chest pain-free.  He does describe some exertional chest tightness and dyspnea but this is largely stable and unchanged from his last cardiac catheterization in 08/2021.  His biggest complaint is that he feels like he cannot get over this pneumonia persistent cough.  I do not think his symptoms are due to his CAD or CHF.  We deferred treatment of recent CAP and pulmonary sarcoidosis to primary team. - Continue DAPT with Aspirin and Plavix. - He has Crestor 88m daily and Zetia 174mdaily listed under PTA medications but reported he was not taking these on admission. He has been intolerant to statins in the past. Consider referral to lipid clinic as an outpatient for consideration of PCSK9 inhibitor. -Check echo  Frequent PVCs Accelerated Idioventricular Rhythm Telemetry shows frequent PVCs and ventricular couplets as well as short runs of NSVT around 3-4 beats. Also showed a couple of episode of an accelerated  idioventricular rhythm with rates in the 80s. Overall rates ranging from the 60s to the 80s. -  Potassium 3.2. Being repleted by primary team. - Will check magnesium. - He is not on any AV nodal agents. Will start Toprol-XL 67m daily.  Chronic Diastolic CHF Chest x-ray and chest CTA show improvement in recent pneumonia.  BNP minimally elevated at 118 which is improved from where it was earlier this month.  Most recent echo in 05/2021 showed LVEF of 55-60% with normal wall motion and indeterminate diastolic function, normal RV, and no significant valvular disease. His weight is down about 10lbs from where it was on 10/01/2021. - Does not appear volume overloaded on exam. - Will hold off on any diuretics for now given AKI.  - Suspect dyspnea is more due to underlying pulmonary sarcoidosis and recent CAP.  Hypertension BP mildly elevated at times. - Continue home Hydralazine 164mthree times daily.  Hyperlipidemia Patient has Crestor 2055maily and Zetia 31m105mily listed under PTA medications but reported he was not taking these on admission. He has been intolerant to statins in the past.  - Consider referral to lipid clinic as an outpatient for consideration of PCSK9 inhibitor.  AKI Creatinine 1.60 on admission. Most recently 1.16 on recent discharge. - Improved to 1.25 after IV fluids. - Continue to monitor closely.  Otherwise, per primary team: - Pulmonary sarcoidosis: on chronic prednisone - Recent CAP - Adrenal insufficiency - Uncontrolled type 2 diabetes mellitus - Hypothyroidism - Obstructive sleep apnea  Risk Assessment/Risk Scores:    New York Heart Association (NYHA) Functional Class NYHA Class III-IV   For questions or updates, please contact [facility] Please consult [email or web address] for contact info under    Signed, CallDarreld Mclean-C  10/20/2021 9:23 AM   Patient seen and examined.  Agree with above documentation.  Mr. Coufal Royea 75 o51 male with a history  of CAD status post multiple PCI's, chronic diastolic heart failure, PAD, pulmonary sarcoidosis, adrenal insufficiency, hypertension, anemia, OSA, stage III CKD who we are consulted by Dr. Kc fLupita Leash evaluation of chest pain.  He has a complicated coronary history.  Had DES to LAD and PLA in 2011 and DES x2 to RPD[place]2020.  Heart catheterization 12/2020 showed moderate LAD disease, moderately severe diagonal disease, moderate ramus disease, severe proximal RCA stenosis and moderate mid to distal RCA stenosis.  Medical therapy was recommended but given recurrent chest pain ended up undergoing PCI 01/2021 with DES to proximal RCA, DES to mid RCA, DES to D1.  He had been following with Dr. SkaiMarlou Porch [facility]  Underwent cardiac catheterization 08/2021 which showed nonobstructive CAD.  He presented to the ED yesterday with chest pain and shortness of breath.  Troponins negative x2.  EKG showed sinus rhythm, rate 88, nonspecific intraventricular conduction delay, no ST abnormalities.  CTPA showed no PE but concerning for bronchopneumonia.  On exam, patient is alert and oriented, normal rate, irregular rhythm, no murmurs, lungs CTAB, no LE edema.  Chest pain primarily seems related to coughing, suspect noncardiac chest pain.  Does not appear volume overloaded on exam.  Will update echocardiogram.  Frequent PVCs, will start Toprol-XL 25 mg daily.  ChriDonato Heinz

## 2021-10-20 NOTE — Assessment & Plan Note (Signed)
Continue home antihypertensives 

## 2021-10-20 NOTE — Assessment & Plan Note (Signed)
CBG >500 with normal CO2 and anion gap. Normally on 70/30 Novolog 35units with meal. Forgot to take insulin with his lunch today. -moderate SSI ACHS

## 2021-10-20 NOTE — Hospital Course (Addendum)
76 y.o.m w/ medical history significant of CAD s/p stents, HFpEF, sarcoidosis on chronic prednisone, insulin dependent T2DM,, CKD 3a, hypothyroidism, recent admission 7/28-8/3 for LLL pneumonia/acute on chronic diastolic CHF who presents with worsening shortness of breath and chest pain.  Since discharge patient has been having increasing shortness of breath can barely walk across his house and also has left chest pressure worse at night.No LE edema and thinks he actually lost 5lbs in the past week. Saw PCP last week and advised to go to ED but declined and took Z-pak instead which did not help. Followed up with PCP with persistent symptoms and finally decide to come in.  In the ED,afebrile, normotensive on room. No leukocytosis.  Hemoglobin of 11.1 with baseline of 9-10.Creatinine of 1.6 up from 1.16.  CBG of 527 with normal CO2 and normal anion gap. BNP of 118.  Troponin at 11x2.CTA chest obtained showed no pulmonary embolism.  Previous left lower lobe pneumonia appears to be resolving. He was given IV fluids, DuoNeb and hospitalist was then called for admission. Patient was hospitalized, he had multiple complaints-underwent CT abdomen for further evaluation no acute finding, has been tolerating diet no significant vomiting although does complains of being sick/nauseous at times.  Also seen by cardiology given his significant cardiac history with dyspnea on exertion-troponin has been negative EKG sinus rhythm CTA no PE but-  bronchial wall thickening and near-complete resolution of left lower lobe consolidation with mild residual ground-glass opacities seen in the left lower lobe on today's exam compatible with resolving bronchopneumonia-likely etiology.  Managed with IV steroids in the setting of sarcoid disease on chronic steroids likely etiology, obese, stable afebrile on admission.  Felt to be noncardiac.  He has been ambulatory-walked in the hallway twice, not needing oxygen feels well today and feels  ready for discharge.

## 2021-10-21 ENCOUNTER — Inpatient Hospital Stay (HOSPITAL_COMMUNITY): Payer: Medicare HMO

## 2021-10-21 ENCOUNTER — Ambulatory Visit: Payer: Self-pay | Admitting: Licensed Clinical Social Worker

## 2021-10-21 ENCOUNTER — Other Ambulatory Visit: Payer: Self-pay | Admitting: Student

## 2021-10-21 DIAGNOSIS — I25118 Atherosclerotic heart disease of native coronary artery with other forms of angina pectoris: Secondary | ICD-10-CM | POA: Diagnosis not present

## 2021-10-21 DIAGNOSIS — N179 Acute kidney failure, unspecified: Secondary | ICD-10-CM | POA: Diagnosis not present

## 2021-10-21 DIAGNOSIS — R0609 Other forms of dyspnea: Secondary | ICD-10-CM

## 2021-10-21 DIAGNOSIS — I5032 Chronic diastolic (congestive) heart failure: Secondary | ICD-10-CM | POA: Diagnosis not present

## 2021-10-21 DIAGNOSIS — I493 Ventricular premature depolarization: Secondary | ICD-10-CM

## 2021-10-21 LAB — GLUCOSE, CAPILLARY: Glucose-Capillary: 176 mg/dL — ABNORMAL HIGH (ref 70–99)

## 2021-10-21 LAB — CBC
HCT: 35 % — ABNORMAL LOW (ref 39.0–52.0)
Hemoglobin: 10.8 g/dL — ABNORMAL LOW (ref 13.0–17.0)
MCH: 27.2 pg (ref 26.0–34.0)
MCHC: 30.9 g/dL (ref 30.0–36.0)
MCV: 88.2 fL (ref 80.0–100.0)
Platelets: 304 10*3/uL (ref 150–400)
RBC: 3.97 MIL/uL — ABNORMAL LOW (ref 4.22–5.81)
RDW: 20.1 % — ABNORMAL HIGH (ref 11.5–15.5)
WBC: 14.1 10*3/uL — ABNORMAL HIGH (ref 4.0–10.5)
nRBC: 0 % (ref 0.0–0.2)

## 2021-10-21 LAB — BASIC METABOLIC PANEL
Anion gap: 8 (ref 5–15)
BUN: 25 mg/dL — ABNORMAL HIGH (ref 8–23)
CO2: 28 mmol/L (ref 22–32)
Calcium: 8.7 mg/dL — ABNORMAL LOW (ref 8.9–10.3)
Chloride: 103 mmol/L (ref 98–111)
Creatinine, Ser: 1.1 mg/dL (ref 0.61–1.24)
GFR, Estimated: >60 mL/min (ref >60)
Glucose, Bld: 162 mg/dL — ABNORMAL HIGH (ref 70–99)
Potassium: 3.6 mmol/L (ref 3.5–5.1)
Sodium: 139 mmol/L (ref 135–145)

## 2021-10-21 LAB — ECHOCARDIOGRAM LIMITED
Height: 70 in
Weight: 3232 oz

## 2021-10-21 MED ORDER — ALUMINUM & MAGNESIUM HYDROXIDE 200-200 MG/5ML PO SUSP: 15.0000 mL | Freq: Four times a day (QID) | ORAL | 0 refills | Status: AC | PRN

## 2021-10-21 MED ORDER — PREDNISONE 10 MG PO TABS: ORAL_TABLET | ORAL | 0 refills | Status: DC

## 2021-10-21 MED ORDER — FUROSEMIDE 80 MG PO TABS: 80.0000 mg | ORAL_TABLET | Freq: Every day | ORAL | 0 refills | Status: DC | PRN

## 2021-10-21 NOTE — Patient Outreach (Signed)
  Care Coordination   Initial Visit Note   10/21/2021 Name: [name] MRN: [id number] DOB: [birth year]  [name] is a 76 y.o. year old male who sees Shirline Frees, MD for primary care. I spoke with  [name] by phone today  What matters to the patients health and wellness today?  No needs on today. Recently discharged from ED    Goals Addressed             This Visit's Progress    MSW Care Coordination       Patient recently discharged from hospital. SW reminded patient to follow up with PCP in a week.   Patient stated no additional resources is needed at this time.        SDOH assessments and interventions completed:  Yes     Care Coordination Interventions Activated:  Yes  Care Coordination Interventions:  Yes, provided   Follow up plan: No further intervention required.   Encounter Outcome:  Pt. Visit Completed   Lenor Derrick, MSW  Social Worker [facility]  [phone]

## 2021-10-21 NOTE — Progress Notes (Signed)
Ordered 3 day Zio monitor to help quantify PVC burden per Dr. Gardiner Rhyme. Please see his rounding note from today for more information.  Darreld Mclean, PA-C 10/21/2021 10:03 PM

## 2021-10-21 NOTE — Plan of Care (Signed)
PIV removed. DC paperwork reviewed.   Problem: Education: Goal: Ability to describe self-care measures that may prevent or decrease complications (Diabetes Survival Skills Education) will improve Outcome: Completed/Met Goal: Individualized Educational Video(s) Outcome: Completed/Met   Problem: Coping: Goal: Ability to adjust to condition or change in health will improve Outcome: Completed/Met   Problem: Fluid Volume: Goal: Ability to maintain a balanced intake and output will improve Outcome: Completed/Met   Problem: Health Behavior/Discharge Planning: Goal: Ability to identify and utilize available resources and services will improve Outcome: Completed/Met Goal: Ability to manage health-related needs will improve Outcome: Completed/Met   Problem: Metabolic: Goal: Ability to maintain appropriate glucose levels will improve Outcome: Completed/Met   Problem: Nutritional: Goal: Maintenance of adequate nutrition will improve Outcome: Completed/Met Goal: Progress toward achieving an optimal weight will improve Outcome: Completed/Met   Problem: Skin Integrity: Goal: Risk for impaired skin integrity will decrease Outcome: Completed/Met   Problem: Tissue Perfusion: Goal: Adequacy of tissue perfusion will improve Outcome: Completed/Met   Problem: Education: Goal: Knowledge of General Education information will improve Description: Including pain rating scale, medication(s)/side effects and non-pharmacologic comfort measures Outcome: Completed/Met   Problem: Health Behavior/Discharge Planning: Goal: Ability to manage health-related needs will improve Outcome: Completed/Met   Problem: Clinical Measurements: Goal: Ability to maintain clinical measurements within normal limits will improve Outcome: Completed/Met Goal: Will remain free from infection Outcome: Completed/Met Goal: Diagnostic test results will improve Outcome: Completed/Met Goal: Respiratory complications will  improve Outcome: Completed/Met Goal: Cardiovascular complication will be avoided Outcome: Completed/Met   Problem: Activity: Goal: Risk for activity intolerance will decrease Outcome: Completed/Met   Problem: Nutrition: Goal: Adequate nutrition will be maintained Outcome: Completed/Met   Problem: Coping: Goal: Level of anxiety will decrease Outcome: Completed/Met   Problem: Elimination: Goal: Will not experience complications related to bowel motility Outcome: Completed/Met Goal: Will not experience complications related to urinary retention Outcome: Completed/Met   Problem: Pain Managment: Goal: General experience of comfort will improve Outcome: Completed/Met   Problem: Safety: Goal: Ability to remain free from injury will improve Outcome: Completed/Met   Problem: Skin Integrity: Goal: Risk for impaired skin integrity will decrease Outcome: Completed/Met

## 2021-10-21 NOTE — Progress Notes (Signed)
Echocardiogram 2D Echocardiogram has been performed.  Oneal Deputy Rhianon Zabawa RDCS 10/21/2021, 8:44 AM

## 2021-10-21 NOTE — TOC Initial Note (Signed)
Transition of Care [facility]) - Initial/Assessment Note    Patient Details  Name: [name] MRN: [id number] Date of Birth: 12/24/1945  Transition of Care [facility]) CM/SW Contact:    Leeroy Cha, RN Phone Number: 10/21/2021, 8:28 AM  Clinical Narrative:                  Transition of Care [facility]) Screening Note   Patient Details  Name: [name] Date of Birth: 22-Jul-1945   Transition of Care [facility]) CM/SW Contact:    Leeroy Cha, RN Phone Number: 10/21/2021, 8:28 AM    Transition of Care Department (TOC) has reviewed patient and no TOC needs have been identified at this time. We will continue to monitor patient advancement through interdisciplinary progression rounds. If new patient transition needs arise, please place a TOC consult.    Expected Discharge Plan: Home/Self Care Barriers to Discharge: Continued Medical Work up   Patient Goals and CMS Choice Patient states their goals for this hospitalization and ongoing recovery are:: to go home   Choice offered to / list presented to : Patient  Expected Discharge Plan and Services Expected Discharge Plan: Home/Self Care   Discharge Planning Services: CM Consult   Living arrangements for the past 2 months: Single Family Home                                      Prior Living Arrangements/Services Living arrangements for the past 2 months: Single Family Home Lives with:: Spouse Patient language and need for interpreter reviewed:: Yes Do you feel safe going back to the place where you live?: Yes               Activities of Daily Living Home Assistive Devices/Equipment: Eyeglasses, CPAP, Dentures (specify type) ADL Screening (condition at time of admission) Patient's cognitive ability adequate to safely complete daily activities?: Yes Is the patient deaf or have difficulty hearing?: No Does the patient have difficulty seeing, even when wearing glasses/contacts?: No Does the patient have difficulty  concentrating, remembering, or making decisions?: No Patient able to express need for assistance with ADLs?: Yes Does the patient have difficulty dressing or bathing?: No Independently performs ADLs?: Yes (appropriate for developmental age) Does the patient have difficulty walking or climbing stairs?: No Weakness of Legs: None Weakness of Arms/Hands: None  Permission Sought/Granted                  Emotional Assessment Appearance:: Appears stated age Attitude/Demeanor/Rapport: Engaged Affect (typically observed): Calm Orientation: : Oriented to Self, Oriented to Place, Oriented to  Time, Oriented to Situation Alcohol / Substance Use: Never Used Psych Involvement: No (comment)  Admission diagnosis:  Dehydration [E86.0] AKI (acute kidney injury) ([place]) [N17.9] Patient Active Problem List   Diagnosis Date Noted   HTN (hypertension) 10/20/2021   Chest pain of uncertain etiology    Frequent PVCs    Acute renal failure superimposed on stage 3a chronic kidney disease ([place]) 10/19/2021   Lobar pneumonia ([employer]) 10/07/2021   CAP (community acquired pneumonia) 10/01/2021   Back pain 09/23/2021   Herniated lumbar intervertebral disc 09/23/2021   Lumbar radiculopathy, acute 09/14/2021   Azotemia 09/14/2021   Intractable low back pain 09/13/2021   Acute encephalopathy 09/13/2021   Bandemia 09/13/2021   Acute respiratory failure with hypoxia ([place]) 09/13/2021   DOE (dyspnea on exertion) 12/25/2020   Gastric polyps    GI  bleed 01/01/2020   Osteomyelitis ([place]) 01/01/2020   Charcot's joint, left ankle and foot 11/06/2019   Peripheral arterial disease ([place]) 07/12/2019   Pain in right elbow 10/30/2018   AKI (acute kidney injury) ([place]) 10/03/2018   Hypoalbuminemia 10/03/2018   Hypokalemia 10/03/2018   Hypothyroidism 10/03/2018   Stage 3a chronic kidney disease (CKD) ([place])    Iron deficiency anemia    Normocytic anemia 06/29/2016   Chronic diastolic heart failure ([place]) 01/14/2014    Stable angina ([place]) 01/14/2014   Obesity 01/14/2014   Pulmonary sarcoidosis ([place]) 01/14/2014   Uncontrolled type 2 diabetes mellitus with hyperglycemia, with long-term current use of insulin ([place]) 01/14/2014   Hyperlipidemia 01/14/2014   Coronary atherosclerosis of native coronary artery    Sinusitis, chronic 05/30/2011   OSA (obstructive sleep apnea) 10/09/2008   ADRENAL INSUFFICIENCY, HX OF 02/21/2008   Coronary artery disease with exertional angina ([place]) 11/20/2007   Sarcoidosis 02/20/2007   Diabetes mellitus without complication ([place]) 91/79/1505   GERD 12/20/2006   PCP:  Shirline Frees, MD Pharmacy:   [id number] [place] [place]Phone: [phone]: [phone] [facility] [address] [place]Phone: [phone]: [phone] [place]Phone: [phone]: [phone]    Social Determinants of Health (SDOH) Interventions    Readmission Risk Interventions     No data to display

## 2021-10-21 NOTE — TOC Transition Note (Signed)
Transition of Care [facility]) - CM/SW Discharge Note   Patient Details  Name: [name] MRN: [id number] Date of Birth: 04/11/1945  Transition of Care [facility]) CM/SW Contact:  Leeroy Cha, RN Phone Number: 10/21/2021, 11:41 AM   Clinical Narrative:    Discharged to return home with self care no toc needs present.   Final next level of care: Home/Self Care Barriers to Discharge: Barriers Resolved   Patient Goals and CMS Choice Patient states their goals for this hospitalization and ongoing recovery are:: to go home   Choice offered to / list presented to : Patient  Discharge Placement                       Discharge Plan and Services   Discharge Planning Services: CM Consult                                 Social Determinants of Health (SDOH) Interventions     Readmission Risk Interventions     No data to display

## 2021-10-21 NOTE — Discharge Summary (Signed)
Physician Discharge Summary  [name] HQI:[id number] DOB: Jun 21, 1945 DOA: 10/19/2021  PCP: Shirline Frees, MD  Admit date: 10/19/2021 Discharge date: 10/21/2021 Recommendations for Outpatient Follow-up:  Follow up with PCP in 1 weeks-call for appointment Follow-up with cardiology as outpatient Please obtain BMP/CBC in one week  Discharge Dispo: home Discharge Condition: Stable Code Status:   Code Status: Full Code Diet recommendation:  Diet Order             Diet Carb Modified Fluid consistency: Thin; Room service appropriate? Yes  Diet effective now                    Brief/Interim Summary: 76 y.o.m w/ medical history significant of CAD s/p stents, HFpEF, sarcoidosis on chronic prednisone, insulin dependent T2DM,, CKD 3a, hypothyroidism, recent admission 7/28-8/3 for LLL pneumonia/acute on chronic diastolic CHF who presents with worsening shortness of breath and chest pain.  Since discharge patient has been having increasing shortness of breath can barely walk across his house and also has left chest pressure worse at night.No LE edema and thinks he actually lost 5lbs in the past week. Saw PCP last week and advised to go to ED but declined and took Z-pak instead which did not help. Followed up with PCP with persistent symptoms and finally decide to come in.  In the ED,afebrile, normotensive on room. No leukocytosis.  Hemoglobin of 11.1 with baseline of 9-10.Creatinine of 1.6 up from 1.16.  CBG of 527 with normal CO2 and normal anion gap. BNP of 118.  Troponin at 11x2.CTA chest obtained showed no pulmonary embolism.  Previous left lower lobe pneumonia appears to be resolving. He was given IV fluids, DuoNeb and hospitalist was then called for admission. Patient was hospitalized, he had multiple complaints-underwent CT abdomen for further evaluation no acute finding, has been tolerating diet no significant vomiting although does complains of being sick/nauseous at times.  Also seen by  cardiology given his significant cardiac history with dyspnea on exertion-troponin has been negative EKG sinus rhythm CTA no PE but-  bronchial wall thickening and near-complete resolution of left lower lobe consolidation with mild residual ground-glass opacities seen in the left lower lobe on today's exam compatible with resolving bronchopneumonia-likely etiology.  Managed with IV steroids in the setting of sarcoid disease on chronic steroids likely etiology, obese, stable afebrile on admission.  Felt to be noncardiac.  He has been ambulatory-walked in the hallway twice, not needing oxygen feels well today and feels ready for discharge.   Discharge Diagnoses:  Principal Problem:   Coronary artery disease with exertional angina [facility]) Active Problems:   Sarcoidosis   Chronic diastolic heart failure ([place])   Uncontrolled type 2 diabetes mellitus with hyperglycemia, with long-term current use of insulin ([facility])   AKI (acute kidney injury) ([place])   Hypokalemia   Hypothyroidism   DOE (dyspnea on exertion)   Acute renal failure superimposed on stage 3a chronic kidney disease ([facility])   HTN (hypertension)   Chest pain of uncertain etiology   Frequent PVCs  Shortness of breath Chest pain Coronary artery disease with exertional angina Symptoms likely multifactorial in the setting of resolving pneumonia, multiple medical issues, chronic CHF sarcoidosis.  Symptoms now much improved, will do quick steroid taper and he will resume on his 10 mg prednisone after the taper is done. seen by cardiology given his significant cardiac history with dyspnea on exertion-troponin has been negative EKG sinus rhythm CTA no PE but-  bronchial wall thickening and near-complete resolution  of left lower lobe consolidation with mild residual ground-glass opacities seen in the left lower lobe on today's exam compatible with resolving bronchopneumonia-likely etiology.  Managed with IV steroids in the setting of sarcoid disease on  chronic steroids likely etiology, obese, stable afebrile on admission.  Repeat Echo is Stable "Limited echo for LV function   2. Left ventricular ejection fraction, by estimation, is 55 to 60%. The  left ventricle has normal function. The left ventricle has no regional  wall motion abnormalities.   3. The inferior vena cava is dilated in size with >50% respiratory  variability, suggesting right atrial pressure of 8 mmHg.  Comparison(s): No significant change from prior study. 05/13/2021: LVEF  55-60%.     Nausea/abdominal discomfort: cont PPI, add maalox. CT abdomen no acute finding   JOI:NOMVEHMCNO on hydralazine.   AKI on CKD3a:Creatinine of 1.6 up from 1.16.Has received 1.5L of IV fluid in ED. creatinine improved to baseline continue home meds -Lasix changed to as needed upon discharge Recent Labs  Lab 10/19/21 1717 10/20/21 0411 10/21/21 0519  BUN 35* 25* 25*  CREATININE 1.60* 1.25* 1.10    Hypothyroidism:Continue levothyroxine   Uncontrolled type 2 diabetes mellitus with hyperglycemia, with long-term current use of insulin CBG >500 with normal CO2/anion gap. Normally on 70/30 Novolog tid at home-on twice daily insulin regimen doing well, he is advised to monitor his blood sugar very closely while using steroid.  He is to follow-up with his PCP as well Recent Labs  Lab 10/20/21 1133 10/20/21 1642 10/20/21 2051 10/20/21 2356 10/21/21 0759  GLUCAP 223* 348* 359* 117* 176*     Chronic diastolic heart failure:Appears euvolemic on exam although has AKI so could be over diuresis.  He is on Lasix upon discharge. Per cards take Lasix 80 mg as needed if gains more than 3 pounds in 1 day or 5 pounds in 1 week.cleared for discharge by cardiology.     Sarcoidosis:Questionable whether sacrodosis flare up-feels better on IV steroids we will do quick taper and resume his p.o. prednisone  Hypokalemia repleted   Consults: Resume Subjective: Alert awake and oriented resting comfortably  no new complaints today.  Feels much improved and feels ready for discharge.  Wife is at the bedside.  Discharge Exam: Vitals:   10/20/21 2144 10/21/21 0202  BP:  (!) 150/69  Pulse: 83 68  Resp: 20 18  Temp:  (!) 97.4 F (36.3 C)  SpO2:  98%   General: Pt is alert, awake, not in acute distress Cardiovascular: RRR, S1/S2 +, no rubs, no gallops Respiratory: CTA bilaterally, no wheezing, no rhonchi Abdominal: Soft, NT, ND, bowel sounds + Extremities: no edema, no cyanosis  Discharge Instructions  Discharge Instructions     (HEART FAILURE PATIENTS) Call MD:  Anytime you have any of the following symptoms: 1) 3 pound weight gain in 24 hours or 5 pounds in 1 week 2) shortness of breath, with or without a dry hacking cough 3) swelling in the hands, feet or stomach 4) if you have to sleep on extra pillows at night in order to breathe.   Complete by: As directed    Discharge instructions   Complete by: As directed    Please call call MD or return to ER for similar or worsening recurring problem that brought you to hospital or if any fever,nausea/vomiting,abdominal pain, uncontrolled pain, chest pain,  shortness of breath or any other alarming symptoms.  Please follow-up your doctor as instructed in a week time and call  the office for appointment.  Please avoid alcohol, smoking, or any other illicit substance and maintain healthy habits including taking your regular medications as prescribed.  You were cared for by a hospitalist during your hospital stay. If you have any questions about your discharge medications or the care you received while you were in the hospital after you are discharged, you can call the unit and ask to speak with the hospitalist on call if the hospitalist that took care of you is not available.  Once you are discharged, your primary care physician will handle any further medical issues. Please note that NO REFILLS for any discharge medications will be authorized once  you are discharged, as it is imperative that you return to your primary care physician (or establish a relationship with a primary care physician if you do not have one) for your aftercare needs so that they can reassess your need for medications and monitor your lab values   Increase activity slowly   Complete by: As directed       Allergies as of 10/21/2021       Reactions   Statins Other (See Comments)   Pt says they make him "mean"   Hydrocortisone Nausea Only   Ranexa [ranolazine] Other (See Comments)   Severe weakness/near syncope after 1 dose Hallucinations    Hydrocodone Nausea And Vomiting   Miralax [polyethylene Glycol] Nausea And Vomiting   Z-pak [azithromycin] Other (See Comments)   Raises blood sugar   Zaroxolyn [metolazone] Other (See Comments)   Drained, no energy        Medication List     TAKE these medications    acetaminophen 500 MG tablet Commonly known as: TYLENOL Take 1,000 mg by mouth daily as needed for mild pain (pain).   Albuterol Sulfate 108 (90 Base) MCG/ACT Aepb Commonly known as: ProAir RespiClick Inhale 2 puffs into the lungs every 6 (six) hours as needed. What changed:  when to take this reasons to take this   albuterol (2.5 MG/3ML) 0.083% nebulizer solution Commonly known as: PROVENTIL Take 3 mLs (2.5 mg total) by nebulization every 6 (six) hours as needed for wheezing or shortness of breath. Dx Code D86.0 What changed: Another medication with the same name was changed. Make sure you understand how and when to take each.   aluminum-magnesium hydroxide 200-200 MG/5ML suspension Take 15 mLs by mouth every 6 (six) hours as needed for up to 10 days for indigestion.   aspirin EC 81 MG tablet Take 81 mg by mouth every evening.   clopidogrel 75 MG tablet Commonly known as: PLAVIX Take 1 tablet (75 mg total) by mouth daily. Restart on 09/28/21   CoQ-10 400 MG Caps Take 400 mg by mouth 2 (two) times a week. Sunday, Wednesday   ezetimibe  10 MG tablet Commonly known as: ZETIA Take 10 mg by mouth once a week.   furosemide 80 MG tablet Commonly known as: LASIX Take 1 tablet (80 mg total) by mouth daily as needed for edema (take Lasix 80 mg as needed if gains more than 3 pounds in 1 day or 5 pounds in 1 week). What changed: reasons to take this   gabapentin 100 MG capsule Commonly known as: NEURONTIN Take 1 capsule (100 mg total) by mouth 3 (three) times daily. What changed:  when to take this reasons to take this   hydrALAZINE 10 MG tablet Commonly known as: APRESOLINE Take 1 tablet (10 mg total) by mouth every 8 (eight) hours. What  changed: when to take this   insulin aspart protamine- aspart (70-30) 100 UNIT/ML injection Commonly known as: NOVOLOG MIX 70/30 Inject 0.4 mLs (40 Units total) into the skin with breakfast, with lunch, and with evening meal. What changed: how much to take   levothyroxine 75 MCG tablet Commonly known as: SYNTHROID Take 75 mcg by mouth daily.   lidocaine 5 % Commonly known as: LIDODERM Place 1 patch onto the skin daily as needed for pain.   montelukast 10 MG tablet Commonly known as: SINGULAIR Take 10 mg by mouth daily as needed (allergies).   multivitamin with minerals Tabs tablet Take 1 tablet by mouth daily.   nitroGLYCERIN 0.4 MG SL tablet Commonly known as: NITROSTAT Place 1 tablet (0.4 mg total) under the tongue every 5 (five) minutes as needed for chest pain.   OVER THE COUNTER MEDICATION Take 1 tablet by mouth daily. Omega XL   oxyCODONE 5 MG immediate release tablet Commonly known as: Oxy IR/ROXICODONE Take 1 tablet (5 mg total) by mouth every 4 (four) hours as needed for severe pain or breakthrough pain.   potassium chloride 10 MEQ tablet Commonly known as: KLOR-CON M Take 10 mEq by mouth daily.   predniSONE 10 MG tablet Commonly known as: DELTASONE Take PO 4 tabs daily x 2 days,3 tabs daily x 2 days,2 tabs daily x 2 days,then resume home dose 1 tab  daily What changed:  how much to take how to take this when to take this additional instructions   RABEprazole 20 MG tablet Commonly known as: ACIPHEX Take 20 mg by mouth daily.   rosuvastatin 20 MG tablet Commonly known as: CRESTOR Take 20 mg by mouth daily.   Vitamin D3 75 MCG (3000 UT) Tabs Take 3,000 Units by mouth daily.        Follow-up Information     Shirline Frees, MD Follow up in 1 week(s).   Specialty: Family Medicine Contact information: [place] [address] [phone]         Jerline Pain, MD .   Specialty: Cardiology Contact information: [phone] [place] [address] [phone]                Allergies  Allergen Reactions   Statins Other (See Comments)    Pt says they make him "mean"   Hydrocortisone Nausea Only   Ranexa [Ranolazine] Other (See Comments)    Severe weakness/near syncope after 1 dose Hallucinations    Hydrocodone Nausea And Vomiting   Miralax [Polyethylene Glycol] Nausea And Vomiting   Z-Pak [Azithromycin] Other (See Comments)    Raises blood sugar   Zaroxolyn [Metolazone] Other (See Comments)    Drained, no energy    The results of significant diagnostics from this hospitalization (including imaging, microbiology, ancillary and laboratory) are listed below for reference.    Microbiology: No results found for this or any previous visit (from the past 240 hour(s)).  Procedures/Studies: ECHOCARDIOGRAM LIMITED  Result Date: 10/21/2021    ECHOCARDIOGRAM LIMITED REPORT   Patient Name:   [name] Date of Exam: 10/21/2021 Medical Rec #:  [id number]  Height:       70.0 in Accession #:    [id number] Weight:       202.0 lb Date of Birth:  March 16, 1945  BSA:          2.096 m Patient Age:    15 years   BP:  150/69 mmHg Patient Gender: M          HR:           52 bpm. Exam Location:  Inpatient Procedure: Limited Echo and Color Doppler Indications:    R06.9 DOE  History:         Patient has prior history of Echocardiogram examinations, most                 recent 05/13/2021. CHF, CAD; Risk Factors:Hypertension, Diabetes,                 Dyslipidemia and Sleep Apnea.  Sonographer:    Raquel Sarna Senior RDCS Referring Phys: [id number] Donato Heinz  Sonographer Comments: Limited IMPRESSIONS  1. Limited echo for LV function  2. Left ventricular ejection fraction, by estimation, is 55 to 60%. The left ventricle has normal function. The left ventricle has no regional wall motion abnormalities.  3. The inferior vena cava is dilated in size with >50% respiratory variability, suggesting right atrial pressure of 8 mmHg. Comparison(s): No significant change from prior study. 05/13/2021: LVEF 55-60%. FINDINGS  Left Ventricle: Left ventricular ejection fraction, by estimation, is 55 to 60%. The left ventricle has normal function. The left ventricle has no regional wall motion abnormalities. Pericardium: Trivial pericardial effusion is present. The pericardial effusion is posterior to the left ventricle. Venous: The inferior vena cava is dilated in size with greater than 50% respiratory variability, suggesting right atrial pressure of 8 mmHg. Lyman Bishop MD Electronically signed by Lyman Bishop MD Signature Date/Time: 10/21/2021/10:43:08 AM    Final    CT ABDOMEN PELVIS WO CONTRAST  Result Date: 10/20/2021 CLINICAL DATA:  Acute nonlocalized abdominal pain. EXAM: CT ABDOMEN AND PELVIS WITHOUT CONTRAST TECHNIQUE: Multidetector CT imaging of the abdomen and pelvis was performed following the standard protocol without IV contrast. RADIATION DOSE REDUCTION: This exam was performed according to the departmental dose-optimization program which includes automated exposure control, adjustment of the mA and/or kV according to patient size and/or use of iterative reconstruction technique. COMPARISON:  None Available. FINDINGS: Lower chest: Benign pleuroparenchymal linear thickening at the LEFT lung base.  Hepatobiliary: No focal hepatic lesion. Postcholecystectomy. No biliary dilatation. Pancreas: Pancreas is normal. No ductal dilatation. No pancreatic inflammation. Spleen: Normal spleen Adrenals/urinary tract: Adrenal glands normal. No nephrolithiasis. Ureters normal. High-density excreted contrast within the bladder from CT 1 day prior. Stomach/Bowel: Stomach, small-bowel and cecum normal. Post appendectomy. the colon and rectosigmoid colon are normal. Vascular/Lymphatic: Abdominal aorta is normal caliber with atherosclerotic calcification. There is no retroperitoneal or periportal lymphadenopathy. No pelvic lymphadenopathy. Reproductive: Prostate unremarkable Other: No free fluid. Musculoskeletal: No aggressive osseous lesion. IMPRESSION: 1. No explanation for abdominal pain. 2. Linear benign-appearing scarring at the LEFT lung base. 3. Post cholecystectomy. 4. No bowel obstruction or inflammation. Electronically Signed   By: Suzy Bouchard M.D.   On: 10/20/2021 16:29   CT Angio Chest PE W and/or Wo Contrast  Result Date: 10/19/2021 CLINICAL DATA:  Pulmonary embolus suspected EXAM: CT ANGIOGRAPHY CHEST WITH CONTRAST TECHNIQUE: Multidetector CT imaging of the chest was performed using the standard protocol during bolus administration of intravenous contrast. Multiplanar CT image reconstructions and MIPs were obtained to evaluate the vascular anatomy. RADIATION DOSE REDUCTION: This exam was performed according to the departmental dose-optimization program which includes automated exposure control, adjustment of the mA and/or kV according to patient size and/or use of iterative reconstruction technique. CONTRAST:  62m OMNIPAQUE IOHEXOL 350 MG/ML SOLN COMPARISON:  Chest CTA dated October 01, 2021; chest CT dated May 06, 2019 FINDINGS: Cardiovascular: No evidence of pulmonary embolus, . Normal heart size. No pericardial effusion. Moderate coronary artery calcifications. Normal caliber thoracic aorta with moderate  atherosclerotic disease. Mediastinum/Nodes: Esophagus and thyroid are unremarkable. Calcified right hilar lymph nodes, likely sequela of prior granulomatous infection. No pathologically enlarged lymph nodes seen in the chest. Lungs/Pleura: Central airways are patent. Interval decreased bilateral bronchial wall thickening and near-complete resolution of left lower lobe consolidation with mild residual ground-glass opacities seen in the left lower lobe on today's exam. Subpleural solid pulmonary nodule of the right lower lobe measuring 5 mm in mean diameter on series 6, image 66 and solid pulmonary nodule of the right upper lobe measuring 5 mm in mean diameter on image 76, nodules are stable when compared with 2021 prior exam, no further follow-up is needed given greater than 1 year stability. Upper Abdomen: No acute abnormality. Musculoskeletal: No chest wall abnormality. No acute or significant osseous findings. Review of the MIP images confirms the above findings. IMPRESSION: 1. No evidence of pulmonary embolus. 2. Interval decreased bilateral bronchial wall thickening and near-complete resolution of left lower lobe consolidation with mild residual ground-glass opacities seen in the left lower lobe on today's exam compatible with resolving bronchopneumonia. 3. Coronary artery calcifications and aortic Atherosclerosis (ICD10-I70.0). Electronically Signed   By: Yetta Glassman M.D.   On: 10/19/2021 20:12   DG Chest 2 View  Result Date: 10/19/2021 CLINICAL DATA:  Chest pain and shortness of breath with recent admission for pneumonia. History of sarcoidosis. EXAM: CHEST - 2 VIEW COMPARISON:  10/01/2021 plain film and CT. Remote chest radiograph of 08/28/2019 also reviewed. FINDINGS: Midline trachea. Mild cardiomegaly. No pleural effusion or pneumothorax. Improved bibasilar aeration since 10/01/2021. Residual interstitial thickening and bibasilar scarring are relatively similar to 08/28/2019. IMPRESSION: Improved  bibasilar aeration, with scarring, felt to be similar to 08/28/2019. Basilar predominant interstitial thickening, unchanged and nonspecific. Electronically Signed   By: Abigail Miyamoto M.D.   On: 10/19/2021 16:27   VAS [place] LOWER EXTREMITY VENOUS (DVT) (ONLY [facility] & [facility])  Result Date: 10/01/2021  Lower Venous DVT Study Patient Name:  BALDEMAR DADY  Date of Exam:   10/01/2021 Medical Rec #: [id number]   Accession #:    [id number] Date of Birth: 02-27-1946   Patient Gender: M Patient Age:   23 years Exam Location:  [facility] Procedure:      VAS [place] LOWER EXTREMITY VENOUS (DVT) Referring Phys: Benjamine Mola REES --------------------------------------------------------------------------------  Indications: Edema.  Comparison Study: No previous exam noted. Performing Technologist: Bobetta Lime BS, RVT  Examination Guidelines: A complete evaluation includes B-mode imaging, spectral Doppler, color Doppler, and power Doppler as needed of all accessible portions of each vessel. Bilateral testing is considered an integral part of a complete examination. Limited examinations for reoccurring indications may be performed as noted. The reflux portion of the exam is performed with the patient in reverse Trendelenburg.  +-----+---------------+---------+-----------+----------+--------------+ RIGHTCompressibilityPhasicitySpontaneityPropertiesThrombus Aging +-----+---------------+---------+-----------+----------+--------------+ CFV  Full           Yes      Yes                                 +-----+---------------+---------+-----------+----------+--------------+   +---------+---------------+---------+-----------+----------+--------------+ LEFT     CompressibilityPhasicitySpontaneityPropertiesThrombus Aging +---------+---------------+---------+-----------+----------+--------------+ CFV      Full           Yes      Yes                                  +---------+---------------+---------+-----------+----------+--------------+  SFJ      Full                                                        +---------+---------------+---------+-----------+----------+--------------+ FV Prox  Full                                                        +---------+---------------+---------+-----------+----------+--------------+ FV Mid   Full                                                        +---------+---------------+---------+-----------+----------+--------------+ FV DistalFull                                                        +---------+---------------+---------+-----------+----------+--------------+ PFV      Full                                                        +---------+---------------+---------+-----------+----------+--------------+ POP      Full           Yes      Yes                                 +---------+---------------+---------+-----------+----------+--------------+ PTV      Full                                                        +---------+---------------+---------+-----------+----------+--------------+ PERO     Full                                                        +---------+---------------+---------+-----------+----------+--------------+     Summary: RIGHT: - No evidence of common femoral vein obstruction.  LEFT: - No evidence of deep vein thrombosis in the lower extremity. No indirect evidence of obstruction proximal to the inguinal ligament. - No cystic structure found in the popliteal fossa.  *See table(s) above for measurements and observations. Electronically signed by Jamelle Haring on 10/01/2021 at 2:09:10 PM.    Final    CT Angio Chest PE W/Cm &/Or Wo Cm  Result Date: 10/01/2021 CLINICAL DATA:  76 year old male with suspected pulmonary embolism. EXAM: CT ANGIOGRAPHY CHEST WITH CONTRAST TECHNIQUE: Multidetector CT imaging of the chest was performed using the standard  protocol during  bolus administration of intravenous contrast. Multiplanar CT image reconstructions and MIPs were obtained to evaluate the vascular anatomy. RADIATION DOSE REDUCTION: This exam was performed according to the departmental dose-optimization program which includes automated exposure control, adjustment of the mA and/or kV according to patient size and/or use of iterative reconstruction technique. CONTRAST:  102m OMNIPAQUE IOHEXOL 350 MG/ML SOLN COMPARISON:  Chest CT 05/06/2019. FINDINGS: Cardiovascular: No filling defects within the pulmonary arterial tree to suggest pulmonary embolism. Heart size is normal. There is no significant pericardial fluid, thickening or pericardial calcification. There is aortic atherosclerosis, as well as atherosclerosis of the great vessels of the mediastinum and the coronary arteries, including calcified atherosclerotic plaque in the left anterior descending, left circumflex and right coronary arteries. Mediastinum/Nodes: No pathologically enlarged mediastinal or hilar lymph nodes. Esophagus is unremarkable in appearance. No axillary lymphadenopathy. Lungs/Pleura: Extensive bronchial wall thickening noted throughout the lungs bilaterally. There is also patchy thickening of the peribronchovascular interstitium and extensive peribronchovascular airspace consolidation most severe in the left lower lobe. Small left pleural effusion in the base of the left hemithorax. No right pleural effusion. Upper Abdomen: Aortic atherosclerosis. Status post cholecystectomy. Liver has a shrunken appearance and nodular contour, suggesting underlying cirrhosis. Musculoskeletal: There are no aggressive appearing lytic or blastic lesions noted in the visualized portions of the skeleton. Review of the MIP images confirms the above findings. IMPRESSION: 1. No evidence of pulmonary embolism. 2. Bronchitis with left lower lobe bronchopneumonia. 3. Small parapneumonic pleural effusion in the base of  the left hemithorax. 4. Aortic atherosclerosis, in addition to three-vessel coronary artery disease. Please note that although the presence of coronary artery calcium documents the presence of coronary artery disease, the severity of this disease and any potential stenosis cannot be assessed on this non-gated CT examination. Assessment for potential risk factor modification, dietary therapy or pharmacologic therapy may be warranted, if clinically indicated. Aortic Atherosclerosis (ICD10-I70.0). Electronically Signed   By: DVinnie LangtonM.D.   On: 10/01/2021 07:30   DG Chest Port 1 View  Result Date: 10/01/2021 CLINICAL DATA:  Chest pain and cough. EXAM: PORTABLE CHEST 1 VIEW COMPARISON:  August 28, 2019 FINDINGS: The heart size and mediastinal contours are within normal limits. Mild to moderate severity areas of bibasilar atelectasis and/or infiltrate are noted. There is no evidence of a pleural effusion or pneumothorax. The visualized skeletal structures are unremarkable. IMPRESSION: Mild to moderate severity bibasilar atelectasis and/or infiltrate. Electronically Signed   By: TVirgina NorfolkM.D.   On: 10/01/2021 04:27   DG Lumbar Spine 2-3 Views  Result Date: 09/25/2021 CLINICAL DATA:  Lumbar spine surgery yesterday. EXAM: LUMBAR SPINE - 2-3 VIEW COMPARISON:  MRI lumbar spine without contrast 09/13/2021 FINDINGS: Bilateral pedicle screw and rod fixation is noted. Discectomy and fusion noted. Atherosclerotic calcifications are present in the aorta. IMPRESSION: Status post lumbar fusion without radiographic evidence for complication. Electronically Signed   By: CSan MorelleM.D.   On: 09/25/2021 09:04   DG Spine Portable 1 View  Result Date: 09/24/2021 CLINICAL DATA:  Spine surgery EXAM: PORTABLE SPINE - 1 VIEW COMPARISON:  MRI 09/13/2021 FINDINGS: Single lateral view of the lumbar spine obtained intraoperatively. Posterior lumbar fusion hardware with interbody device at L4 and L5. Linear  instrument overlies the S2 segment. IMPRESSION: Limited lateral intraoperative view of the lumbar spine. Electronically Signed   By: KDonavan FoilM.D.   On: 09/24/2021 21:32   DG O-ARM IMAGE ONLY/NO REPORT  Result Date: 09/24/2021 There is no Radiologist interpretation  for this exam.  DG C-Arm 1-60 Min-No Report  Result Date: 09/24/2021 Fluoroscopy was utilized by the requesting physician.  No radiographic interpretation.    Labs: BNP (last 3 results) Recent Labs    10/01/21 0455 10/19/21 1717  BNP 141.2* 433.2*   Basic Metabolic Panel: Recent Labs  Lab 10/19/21 1717 10/20/21 0411 10/21/21 0519  NA 134* 139 139  K 4.1 3.2* 3.6  CL 100 101 103  CO2 '23 29 28  '$ GLUCOSE 527* 279* 162*  BUN 35* 25* 25*  CREATININE 1.60* 1.25* 1.10  CALCIUM 8.4* 8.3* 8.7*  MG  --  1.9  --    Liver Function Tests: Recent Labs  Lab 10/19/21 1717  AST 20  ALT 18  ALKPHOS 84  BILITOT 0.5  PROT 5.9*  ALBUMIN 3.0*   No results for input(s): "LIPASE", "AMYLASE" in the last 168 hours. No results for input(s): "AMMONIA" in the last 168 hours. CBC: Recent Labs  Lab 10/19/21 1716 10/20/21 0411 10/21/21 0519  WBC 10.4 10.8* 14.1*  NEUTROABS 8.7*  --   --   HGB 11.1* 11.0* 10.8*  HCT 36.7* 36.0* 35.0*  MCV 88.6 88.5 88.2  PLT 321 281 304   Cardiac Enzymes: No results for input(s): "CKTOTAL", "CKMB", "CKMBINDEX", "TROPONINI" in the last 168 hours. BNP: Invalid input(s): "POCBNP" CBG: Recent Labs  Lab 10/20/21 1133 10/20/21 1642 10/20/21 2051 10/20/21 2356 10/21/21 0759  GLUCAP 223* 348* 359* 117* 176*   D-Dimer No results for input(s): "DDIMER" in the last 72 hours. Hgb A1c No results for input(s): "HGBA1C" in the last 72 hours. Lipid Profile No results for input(s): "CHOL", "HDL", "LDLCALC", "TRIG", "CHOLHDL", "LDLDIRECT" in the last 72 hours. Thyroid function studies No results for input(s): "TSH", "T4TOTAL", "T3FREE", "THYROIDAB" in the last 72 hours.  Invalid  input(s): "FREET3" Anemia work up No results for input(s): "VITAMINB12", "FOLATE", "FERRITIN", "TIBC", "IRON", "RETICCTPCT" in the last 72 hours. Urinalysis    Component Value Date/Time   COLORURINE YELLOW 10/19/2021 1656   APPEARANCEUR CLEAR 10/19/2021 1656   LABSPEC 1.017 10/19/2021 1656   PHURINE 5.0 10/19/2021 1656   GLUCOSEU >=500 (A) 10/19/2021 1656   HGBUR NEGATIVE 10/19/2021 1656   BILIRUBINUR NEGATIVE 10/19/2021 1656   KETONESUR NEGATIVE 10/19/2021 1656   PROTEINUR NEGATIVE 10/19/2021 1656   UROBILINOGEN 0.2 11/09/2010 2146   NITRITE NEGATIVE 10/19/2021 1656   LEUKOCYTESUR NEGATIVE 10/19/2021 1656   Sepsis Labs Recent Labs  Lab 10/19/21 1716 10/20/21 0411 10/21/21 0519  WBC 10.4 10.8* 14.1*   Microbiology No results found for this or any previous visit (from the past 240 hour(s)).   Time coordinating discharge: 25 minutes  SIGNED: Antonieta Pert, MD  [facility] 10/21/2021, 11:16 AM  If 7PM-7AM, please contact night-coverage [email or web address]

## 2021-10-21 NOTE — Patient Instructions (Signed)
Visit Information  Instructions:   Patient was given the following information about care management and care coordination services today, agreed to services, and gave verbal consent: 1.care management/care coordination services include personalized support from designated clinical staff supervised by their physician, including individualized plan of care and coordination with other care providers 2. 24/7 contact phone numbers for assistance for urgent and routine care needs. 3. The patient may stop care management/care coordination services at any time by phone call to the office staff.  Patient verbalizes understanding of instructions and care plan provided today and agrees to view in MyChart. Active MyChart status and patient understanding of how to access instructions and care plan via MyChart confirmed with patient.     No further follow up required: .  [name], BSW , MSW Social Worker [facility]  [phone]      

## 2021-10-21 NOTE — Progress Notes (Signed)
Progress Note  Patient Name: [name] Date of Encounter: 10/21/2021  [facility] Cardiologist: Candee Furbish, MD   Subjective   Denies any chest pain, reports dyspnea significantly improved  Inpatient Medications    Scheduled Meds:  aspirin EC  81 mg Oral QPM   clopidogrel  75 mg Oral Daily   enoxaparin (LOVENOX) injection  40 mg Subcutaneous Q24H   hydrALAZINE  10 mg Oral TID   insulin aspart  0-15 Units Subcutaneous TID PC & HS   insulin aspart protamine- aspart  25 Units Subcutaneous BID WC   levothyroxine  75 mcg Oral Daily   methylPREDNISolone (SOLU-MEDROL) injection  40 mg Intravenous Daily   metoprolol succinate  25 mg Oral Daily   pantoprazole  40 mg Oral Daily   Continuous Infusions:  PRN Meds: gabapentin, ipratropium-albuterol   Vital Signs    Vitals:   10/20/21 1838 10/20/21 2049 10/20/21 2144 10/21/21 0202  BP: 137/69 122/75  (!) 150/69  Pulse: 84 82 83 68  Resp: '18 18 20 18  '$ Temp: 98 F (36.7 C) 97.6 F (36.4 C)  (!) 97.4 F (36.3 C)  TempSrc: Oral Oral  Oral  SpO2: 96% 96%  98%  Weight:      Height:        Intake/Output Summary (Last 24 hours) at 10/21/2021 1058 Last data filed at 10/21/2021 1000 Gross per 24 hour  Intake 1420 ml  Output 150 ml  Net 1270 ml      10/19/2021    3:38 PM 10/01/2021    2:58 PM 09/13/2021    5:22 AM  Last 3 Weights  Weight (lbs) 202 lb 213 lb 10 oz 203 lb 9.6 oz  Weight (kg) 91.627 kg 96.9 kg 92.352 kg      Telemetry    Normal sinus rhythm with PVCs- Personally Reviewed  ECG    No new ECG- Personally Reviewed  Physical Exam   GEN: No acute distress.   Neck: No JVD Cardiac: RRR, no murmurs, rubs, or gallops.  Respiratory: Clear to auscultation bilaterally. GI: Soft, nontender, non-distended  MS: No edema; No deformity. Neuro:  Nonfocal  Psych: Normal affect   Labs    High Sensitivity Troponin:   Recent Labs  Lab 10/01/21 0455 10/01/21 0650 10/19/21 1717 10/19/21 1855  TROPONINIHS 64*  40* 11 11     Chemistry Recent Labs  Lab 10/19/21 1717 10/20/21 0411 10/21/21 0519  NA 134* 139 139  K 4.1 3.2* 3.6  CL 100 101 103  CO2 '23 29 28  '$ GLUCOSE 527* 279* 162*  BUN 35* 25* 25*  CREATININE 1.60* 1.25* 1.10  CALCIUM 8.4* 8.3* 8.7*  MG  --  1.9  --   PROT 5.9*  --   --   ALBUMIN 3.0*  --   --   AST 20  --   --   ALT 18  --   --   ALKPHOS 84  --   --   BILITOT 0.5  --   --   GFRNONAA 45* >60 >60  ANIONGAP '11 9 8    '$ Lipids No results for input(s): "CHOL", "TRIG", "HDL", "LABVLDL", "LDLCALC", "CHOLHDL" in the last 168 hours.  Hematology Recent Labs  Lab 10/19/21 1716 10/20/21 0411 10/21/21 0519  WBC 10.4 10.8* 14.1*  RBC 4.14* 4.07* 3.97*  HGB 11.1* 11.0* 10.8*  HCT 36.7* 36.0* 35.0*  MCV 88.6 88.5 88.2  MCH 26.8 27.0 27.2  MCHC 30.2 30.6 30.9  RDW 20.9* 20.7*  20.1*  PLT 321 281 304   Thyroid No results for input(s): "TSH", "FREET4" in the last 168 hours.  BNP Recent Labs  Lab 10/19/21 1717  BNP 118.7*    DDimer No results for input(s): "DDIMER" in the last 168 hours.   Radiology    ECHOCARDIOGRAM LIMITED  Result Date: 10/21/2021    ECHOCARDIOGRAM LIMITED REPORT   Patient Name:   [name] Date of Exam: 10/21/2021 Medical Rec #:  [id number]  Height:       70.0 in Accession #:    [id number] Weight:       202.0 lb Date of Birth:  10-Mar-1945  BSA:          2.096 m Patient Age:    76 years   BP:           150/69 mmHg Patient Gender: M          HR:           52 bpm. Exam Location:  Inpatient Procedure: Limited Echo and Color Doppler Indications:    R06.9 DOE  History:        Patient has prior history of Echocardiogram examinations, most                 recent 05/13/2021. CHF, CAD; Risk Factors:Hypertension, Diabetes,                 Dyslipidemia and Sleep Apnea.  Sonographer:    Raquel Sarna Senior RDCS Referring Phys: [id number] Donato Heinz  Sonographer Comments: Limited IMPRESSIONS  1. Limited echo for LV function  2. Left ventricular ejection fraction, by  estimation, is 55 to 60%. The left ventricle has normal function. The left ventricle has no regional wall motion abnormalities.  3. The inferior vena cava is dilated in size with >50% respiratory variability, suggesting right atrial pressure of 8 mmHg. Comparison(s): No significant change from prior study. 05/13/2021: LVEF 55-60%. FINDINGS  Left Ventricle: Left ventricular ejection fraction, by estimation, is 55 to 60%. The left ventricle has normal function. The left ventricle has no regional wall motion abnormalities. Pericardium: Trivial pericardial effusion is present. The pericardial effusion is posterior to the left ventricle. Venous: The inferior vena cava is dilated in size with greater than 50% respiratory variability, suggesting right atrial pressure of 8 mmHg. Lyman Bishop MD Electronically signed by Lyman Bishop MD Signature Date/Time: 10/21/2021/10:43:08 AM    Final    CT ABDOMEN PELVIS WO CONTRAST  Result Date: 10/20/2021 CLINICAL DATA:  Acute nonlocalized abdominal pain. EXAM: CT ABDOMEN AND PELVIS WITHOUT CONTRAST TECHNIQUE: Multidetector CT imaging of the abdomen and pelvis was performed following the standard protocol without IV contrast. RADIATION DOSE REDUCTION: This exam was performed according to the departmental dose-optimization program which includes automated exposure control, adjustment of the mA and/or kV according to patient size and/or use of iterative reconstruction technique. COMPARISON:  None Available. FINDINGS: Lower chest: Benign pleuroparenchymal linear thickening at the LEFT lung base. Hepatobiliary: No focal hepatic lesion. Postcholecystectomy. No biliary dilatation. Pancreas: Pancreas is normal. No ductal dilatation. No pancreatic inflammation. Spleen: Normal spleen Adrenals/urinary tract: Adrenal glands normal. No nephrolithiasis. Ureters normal. High-density excreted contrast within the bladder from CT 1 day prior. Stomach/Bowel: Stomach, small-bowel and cecum normal.  Post appendectomy. the colon and rectosigmoid colon are normal. Vascular/Lymphatic: Abdominal aorta is normal caliber with atherosclerotic calcification. There is no retroperitoneal or periportal lymphadenopathy. No pelvic lymphadenopathy. Reproductive: Prostate unremarkable Other: No free fluid. Musculoskeletal: No aggressive osseous lesion. IMPRESSION: 1.  No explanation for abdominal pain. 2. Linear benign-appearing scarring at the LEFT lung base. 3. Post cholecystectomy. 4. No bowel obstruction or inflammation. Electronically Signed   By: Suzy Bouchard M.D.   On: 10/20/2021 16:29   CT Angio Chest PE W and/or Wo Contrast  Result Date: 10/19/2021 CLINICAL DATA:  Pulmonary embolus suspected EXAM: CT ANGIOGRAPHY CHEST WITH CONTRAST TECHNIQUE: Multidetector CT imaging of the chest was performed using the standard protocol during bolus administration of intravenous contrast. Multiplanar CT image reconstructions and MIPs were obtained to evaluate the vascular anatomy. RADIATION DOSE REDUCTION: This exam was performed according to the departmental dose-optimization program which includes automated exposure control, adjustment of the mA and/or kV according to patient size and/or use of iterative reconstruction technique. CONTRAST:  30m OMNIPAQUE IOHEXOL 350 MG/ML SOLN COMPARISON:  Chest CTA dated October 01, 2021; chest CT dated May 06, 2019 FINDINGS: Cardiovascular: No evidence of pulmonary embolus, . Normal heart size. No pericardial effusion. Moderate coronary artery calcifications. Normal caliber thoracic aorta with moderate atherosclerotic disease. Mediastinum/Nodes: Esophagus and thyroid are unremarkable. Calcified right hilar lymph nodes, likely sequela of prior granulomatous infection. No pathologically enlarged lymph nodes seen in the chest. Lungs/Pleura: Central airways are patent. Interval decreased bilateral bronchial wall thickening and near-complete resolution of left lower lobe consolidation with mild  residual ground-glass opacities seen in the left lower lobe on today's exam. Subpleural solid pulmonary nodule of the right lower lobe measuring 5 mm in mean diameter on series 6, image 66 and solid pulmonary nodule of the right upper lobe measuring 5 mm in mean diameter on image 76, nodules are stable when compared with 2021 prior exam, no further follow-up is needed given greater than 1 year stability. Upper Abdomen: No acute abnormality. Musculoskeletal: No chest wall abnormality. No acute or significant osseous findings. Review of the MIP images confirms the above findings. IMPRESSION: 1. No evidence of pulmonary embolus. 2. Interval decreased bilateral bronchial wall thickening and near-complete resolution of left lower lobe consolidation with mild residual ground-glass opacities seen in the left lower lobe on today's exam compatible with resolving bronchopneumonia. 3. Coronary artery calcifications and aortic Atherosclerosis (ICD10-I70.0). Electronically Signed   By: LYetta GlassmanM.D.   On: 10/19/2021 20:12   DG Chest 2 View  Result Date: 10/19/2021 CLINICAL DATA:  Chest pain and shortness of breath with recent admission for pneumonia. History of sarcoidosis. EXAM: CHEST - 2 VIEW COMPARISON:  10/01/2021 plain film and CT. Remote chest radiograph of 08/28/2019 also reviewed. FINDINGS: Midline trachea. Mild cardiomegaly. No pleural effusion or pneumothorax. Improved bibasilar aeration since 10/01/2021. Residual interstitial thickening and bibasilar scarring are relatively similar to 08/28/2019. IMPRESSION: Improved bibasilar aeration, with scarring, felt to be similar to 08/28/2019. Basilar predominant interstitial thickening, unchanged and nonspecific. Electronically Signed   By: KAbigail MiyamotoM.D.   On: 10/19/2021 16:27    Cardiac Studies     Patient Profile     76y.o. male  with a history of multivessel CAD s/p multiple PCIs (DES to LAD and PLA in 10/2009, [place], and most  recently DES to proximal RCA, DES to mid RCA, and DES to 1st Diag), chronic diastolic CHF, pulmonary sarcoidosis, PAD with left tibial disease (treated medically), hypertension, hyperlipidemia, adrenal insufficiency on chronic prednisone, anemia with prior GI bleed, obstructive sleep apnea, CKD stage III, and low back pain s/p recent lumbar spine laminectomy and fusion who is being seen 10/20/2021 for the evaluation of chest pain/dyspnea  Assessment &  Plan    Chest Pain and Dyspnea on Exertion CAD Patient has a long history of multivessel CAD with multiple PCIs most recently in 01/2021. Most recent cardiac catheterization at [place] in 08/2021 showed nonobstructive CAD with diffuse small vessel disease but no targets for PCI. He now presents with chest pain and dyspnea after recent admission for CAP, pulmonary sarcoidosis flare, and acute on chronic CHF. EKG shows no acute ischemic changes. High-sensitivity troponin negative x2.   -Currently chest pain-free.  He does describe some exertional chest tightness and dyspnea but this is largely stable and unchanged from his last cardiac catheterization in 08/2021.  His biggest complaint is that he feels like he cannot get over this pneumonia persistent cough.  I do not think his symptoms are due to his CAD or CHF.  We deferred treatment of recent CAP and pulmonary sarcoidosis to primary team. - Continue DAPT with Aspirin and Plavix. - He has Crestor '20mg'$  daily and Zetia '10mg'$  daily listed under PTA medications but reported he was not taking these on admission. He has been intolerant to statins in the past. Consider referral to lipid clinic as an outpatient for consideration of PCSK9 inhibitor. -Echo shows normal systolic function   Frequent PVCs Accelerated Idioventricular Rhythm Telemetry shows frequent PVCs and ventricular couplets as well as short runs of NSVT around 3-4 beats. Also showed a couple of episode of an accelerated idioventricular rhythm with rates  in the 80s. Overall rates ranging from the 60s to the 80s. - He is not on any AV nodal agents. Started Toprol-XL '25mg'$  daily.  Plan Zio patch x 3 days on discharge to quantify PVC burden   Chronic Diastolic CHF Chest x-ray and chest CTA show improvement in recent pneumonia.  BNP minimally elevated at 118 which is improved from where it was earlier this month.  Most recent echo in 05/2021 showed LVEF of 55-60% with normal wall motion and indeterminate diastolic function, normal RV, and no significant valvular disease. His weight is down about 10lbs from where it was on 10/01/2021. - Does not appear volume overloaded on exam.  Diuretics on hold given AKI, resolved with IV fluids.  Would switch lasix to as needed on discharge if gains more than 3 pounds in 1 day or 5 pounds in 1 week - Suspect dyspnea is more due to underlying pulmonary sarcoidosis and recent CAP.   Hypertension BP mildly elevated at times. - Continue home Hydralazine '10mg'$  three times daily.   Hyperlipidemia Patient has Crestor '20mg'$  daily and Zetia '10mg'$  daily listed under PTA medications but reported he was not taking these on admission. He has been intolerant to statins in the past.  - Will refer to lipid clinic as an outpatient for consideration of PCSK9 inhibitor.   AKI Creatinine 1.60 on admission. Most recently 1.16 on recent discharge. - Improved to 1.10 after IV fluids. - Continue to monitor closely.   Otherwise, per primary team: - Pulmonary sarcoidosis: on chronic prednisone - Recent CAP - Adrenal insufficiency - Uncontrolled type 2 diabetes mellitus - Hypothyroidism - Obstructive sleep apnea  [facility] will sign off.   Medication Recommendations: Aspirin 81 mg, Plavix 75 mg, hydralazine 10 mg 3 times daily, Toprol-XL 25 mg.  Monitor daily weights and take Lasix 80 mg as needed if gains more than 3 pounds in 1 day or 5 pounds in 1 week Other recommendations (labs, testing, etc): Zio patch x3 days on discharge  to quantify PVC burden Follow up as an outpatient: Will  schedule follow-up with Dr. Marlou Porch and the pharmacy lipid clinic   For questions or updates, please contact [facility] Please consult [email or web address] for contact info under        Signed, Donato Heinz, MD  10/21/2021, 10:58 AM

## 2021-10-22 ENCOUNTER — Ambulatory Visit: Payer: Medicare HMO

## 2021-10-22 DIAGNOSIS — I493 Ventricular premature depolarization: Secondary | ICD-10-CM

## 2021-10-22 NOTE — Progress Notes (Unsigned)
ZIO XT mailed to pt's home address. 

## 2021-10-27 DIAGNOSIS — E1142 Type 2 diabetes mellitus with diabetic polyneuropathy: Secondary | ICD-10-CM | POA: Diagnosis not present

## 2021-10-27 DIAGNOSIS — D860 Sarcoidosis of lung: Secondary | ICD-10-CM | POA: Diagnosis not present

## 2021-10-27 DIAGNOSIS — E1122 Type 2 diabetes mellitus with diabetic chronic kidney disease: Secondary | ICD-10-CM | POA: Diagnosis not present

## 2021-10-27 DIAGNOSIS — D500 Iron deficiency anemia secondary to blood loss (chronic): Secondary | ICD-10-CM | POA: Diagnosis not present

## 2021-10-27 DIAGNOSIS — I10 Essential (primary) hypertension: Secondary | ICD-10-CM | POA: Diagnosis not present

## 2021-10-27 DIAGNOSIS — I2510 Atherosclerotic heart disease of native coronary artery without angina pectoris: Secondary | ICD-10-CM | POA: Diagnosis not present

## 2021-10-27 DIAGNOSIS — E782 Mixed hyperlipidemia: Secondary | ICD-10-CM | POA: Diagnosis not present

## 2021-10-27 DIAGNOSIS — J189 Pneumonia, unspecified organism: Secondary | ICD-10-CM | POA: Diagnosis not present

## 2021-10-27 DIAGNOSIS — E039 Hypothyroidism, unspecified: Secondary | ICD-10-CM | POA: Diagnosis not present

## 2021-11-11 NOTE — Progress Notes (Deleted)
Cardiology Office Note:    Date:  11/11/2021   ID:  [name], DOB 10-17-1945, MRN [id number]  PCP:  Shirline Frees, MD   [facility] Providers Cardiologist:  Candee Furbish, MD { Click to update primary MD,subspecialty MD or APP then REFRESH:1}    Referring MD: Shirline Frees, MD   Chief Complaint: ***  History of Present Illness:    [name] is a *** 76 y.o. male with a hx of multivessel CAD s/p multiple PCIs (DES to LAD and PLA 10/2009, DES x 2 to RPDA 08/2018, and most recently DES to [facility], DES to [facility] and DES to 1st diag 01/2021), pulmonary sarcoidosis, OSA, chronic HFpEF, PAD with left tibial disease (treated medically), HTN, HLD, adrenal insufficiency on chronic prednisone, anemia, with prior GIB, CKD stage III, and low back pain s/p recent lumbar spine laminectomy.   Cardiac cath 12/2020 revealed diffuse 3-vessel disease with moderate LAD beyond the stented segment, moderately severe diagonal disease, moderate disease less than or equal to 70% of the ramus and 97 about 80% stenosis of the proximal RCA as well as 60% stenosis of the mid to distal RCA. Seen by Dr. Marlou Porch 04/2021 at which time having twisting sharp chest pain at times, felt possibly to be MSK. Noted some improvement in SOB following PCI. Continue medical therapy recommended. Also seen by [facility]. Repeat cardiac cath 08/2021 which showed nonobstructive CAD with diffuse small vessel disease but no targets for PCI.   The following is a summary of 2 recent hospitalizations taken from Dr. Newman Nickels consult note on 10/20/21:  Admitted to [facility] from 09/12/2021 to 09/16/2021 for intractable low back pain.  MRI lumbar spine showed showed  anterolisthesis, disc bulge and right foraminal disc extrusion at L4-5 resulting in mild to moderate spinal canal stenosis and severe bilateral neural foraminal narrowing, greater on the right side, with impingement of the bilateral L4 nerve roots.  Neurosurgery recommended steroid  taper and outpatient surgical evaluation.  Hospitalization was complicated by acute encephalopathy felt to be from pain medications and hypotension felt to be due to adrenal insufficiency, dehydration, and opiates as well as AKI which was also felt to be from hypotension. Readmission 09/22/2021 to 09/25/2021 for recurrent low back pain and was ultimately taken to the OR on 09/24/2021 and underwent L4-L5 only invasive laminectomy and transforaminal lumbar interbody fusion. Tolerated this procedure well and was able to be discharged the following day.  He was then readmitted from 10/01/2021 to 10/07/2021 for community-acquired pneumonia after presenting with cough and chest wall pain.  There was also concern for a sarcoidosis flare and a CHF component.  He was treated with IV antibiotics, steroids, and IV Lasix with improvement.   He returned to the ED on 10/19/2021 for chest pain and shortness of breath. EKG normal sinus rhythm, rate 88 bpm, with PVC and nonspecific IVCD and no acute ST/T changes.  High-sensitivity troponin negative x2. BNP minimally elevated at 118 (improved from 141 during last admission earlier this month). Chest x-ray showed improved bibasilar aeration with scarring felt to be similar to prior imaging in 08/2019 as well as unchanged basilar predominant interstitial thickening.  CTA was negative for PE and showed interval decrease bilateral bronchial wall thickening and near complete resolution of left lower lobe consolidation with mild residual groundglass opacity seen in the left lower lobe compatible with resolving bronchopneumonia.  WBC 10.4, Hgb 11.1, Plts 321. Na 134, Glucose 527, BUN 35, Cr 1.60. Albumin 5.9, AST 20,  ALT 18, Alk Phos 84, Total Bili 0.5.  He is given IV fluids and DuoNeb in the ED and was admitted for further evaluation.  Cardiology consulted for assistance.  10/20/21 consult: No ischemic changes on EKG, negative hs troponin x 2, chest pain free. Frequent PVCs, accelerated  idioventricular rhythm, and short runs of NSVT around 3-4 beats. Hypokalemia at 3.2, started on Toprol XL 25 mg daily. Chest CTA showed improvement in recent pneumonia, BNP minimally elevated. Intolerant of statins in the past, consider referral to lipid clinic. Repeat echo 10/21/21 stable with no significant change from previous echo 05/13/21. Assigned Zio monitor at discharge for evaluation of ectopy.  Today, he is here   Past Medical History:  Diagnosis Date   Adrenal insufficiency ([place])    Allergy    Anemia    Arthritis    feet    Broken foot 09/2019   had to wore a boot. Left    CKD (chronic kidney disease), stage III (HCC)    Coronary artery disease    has stents   Coronary atherosclerosis of native coronary artery    Proximal LAD, posterior lateral stent widely patent-10/12/11   Diabetes mellitus    insulin and pills   Hearing loss    wears hearing aids   History of blood transfusion 06/30/2016   [facility] - 2 units transfused   Hyperlipidemia    Hypertension    OSA (obstructive sleep apnea)    uses VPAC sleep study 2 years done through [facility]. Dr. Marlou Porch arranged study   PVD (peripheral vascular disease) ([place])    Sarcoidosis    Sleep apnea    uses CPAP nightly   Thyroid disease    Type 2 diabetes mellitus ([place])     Past Surgical History:  Procedure Laterality Date   APPENDECTOMY     CATARACT EXTRACTION  2011   bilat   CHOLECYSTECTOMY  01/25/2011   Procedure: LAPAROSCOPIC CHOLECYSTECTOMY WITH INTRAOPERATIVE CHOLANGIOGRAM;  Surgeon: Judieth Keens, DO;  Location: [place];  Service: General;  Laterality: N/A;   COLONOSCOPY     several   COLONOSCOPY WITH PROPOFOL N/A 01/03/2020   Procedure: COLONOSCOPY WITH PROPOFOL;  Surgeon: Gatha Mayer, MD;  Location: [facility];  Service: Endoscopy;  Laterality: N/A;   CORONARY ANGIOPLASTY     most recent 11/2009   CORONARY STENT INTERVENTION N/A 08/07/2018   Procedure: CORONARY STENT INTERVENTION;  Surgeon: Jettie Booze, MD;  Location: [place] [facility];  Service: Cardiovascular;  Laterality: N/A;   CORONARY STENT INTERVENTION N/A 01/18/2021   Procedure: CORONARY STENT INTERVENTION;  Surgeon: Sherren Mocha, MD;  Location: [place] [facility];  Service: Cardiovascular;  Laterality: N/A;   CORONARY STENT PLACEMENT  2009   in LAD and side branch PTCA   ESOPHAGOGASTRODUODENOSCOPY (EGD) WITH PROPOFOL N/A 01/03/2020   Procedure: ESOPHAGOGASTRODUODENOSCOPY (EGD) WITH PROPOFOL;  Surgeon: Gatha Mayer, MD;  Location: [facility];  Service: Endoscopy;  Laterality: N/A;   HEMOSTASIS CLIP PLACEMENT  01/03/2020   Procedure: HEMOSTASIS CLIP PLACEMENT;  Surgeon: Gatha Mayer, MD;  Location: [facility];  Service: Endoscopy;;   INTERCOSTAL NERVE BLOCK  2011, 06/2016   x2. lumbar spine   INTRAVASCULAR PRESSURE WIRE/FFR STUDY N/A 01/18/2021   Procedure: INTRAVASCULAR PRESSURE WIRE/FFR STUDY;  Surgeon: Sherren Mocha, MD;  Location: [place] [facility];  Service: Cardiovascular;  Laterality: N/A;   LEFT HEART CATHETERIZATION WITH CORONARY ANGIOGRAM Bilateral 10/12/2011   Procedure: LEFT HEART CATHETERIZATION WITH CORONARY ANGIOGRAM;  Surgeon:  Candee Furbish, MD;  Location: [facility] LAB;  Service: Cardiovascular;  Laterality: Bilateral;   LEFT HEART CATHETERIZATION WITH CORONARY ANGIOGRAM N/A 04/01/2014   Procedure: LEFT HEART CATHETERIZATION WITH CORONARY ANGIOGRAM;  Surgeon: Candee Furbish, MD;  Location: [facility];  Service: Cardiovascular;  Laterality: N/A;   POLYPECTOMY  01/03/2020   Procedure: POLYPECTOMY;  Surgeon: Gatha Mayer, MD;  Location: [facility];  Service: Endoscopy;;   RIGHT/LEFT HEART CATH AND CORONARY ANGIOGRAPHY N/A 08/07/2018   Procedure: RIGHT/LEFT HEART CATH AND CORONARY ANGIOGRAPHY;  Surgeon: Jettie Booze, MD;  Location: [place] [facility];  Service: Cardiovascular;  Laterality: N/A;   RIGHT/LEFT HEART CATH AND CORONARY ANGIOGRAPHY N/A 12/25/2020   Procedure: RIGHT/LEFT HEART CATH  AND CORONARY ANGIOGRAPHY;  Surgeon: Belva Crome, MD;  Location: [place] [facility];  Service: Cardiovascular;  Laterality: N/A;   TRANSFORAMINAL LUMBAR INTERBODY FUSION W/ MIS 1 LEVEL N/A 09/24/2021   Procedure: LUMBAR FOUR-FIVE MINIMALLY INVASIVE (MIS) TRANSFORAMINAL LUMBAR INTERBODY FUSION (TLIF) WITH METRX;  Surgeon: Judith Part, MD;  Location: [place];  Service: Neurosurgery;  Laterality: N/A;    Current Medications: No outpatient medications have been marked as taking for the 11/22/21 encounter (Appointment) with Ann Maki, Lanice Schwab, NP.     Allergies:   Statins, Hydrocortisone, Ranexa [ranolazine], Hydrocodone, Miralax [polyethylene glycol], Z-pak [azithromycin], and Zaroxolyn [metolazone]   Social History   Socioeconomic History   Marital status: Married    Spouse name: Not on file   Number of children: 3   Years of education: Not on file   Highest education level: Not on file  Occupational History   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   Occupation: MANAGER    Employer: [employer]  Tobacco Use   Smoking status: Never   Smokeless tobacco: Never  Vaping Use   Vaping Use: Never used  Substance and Sexual Activity   Alcohol use: No   Drug use: No   Sexual activity: Not on file  Other Topics Concern   Not on file  Social History Narrative   Not on file   Social Determinants of Health   Financial Resource Strain: Not on file  Food Insecurity: No Food Insecurity (10/21/2021)   Hunger Vital Sign    Worried About Running Out of Food in the Last Year: Never true    Ran Out of Food in the Last Year: Never true  Transportation Needs: No Transportation Needs (10/21/2021)   PRAPARE - Hydrologist (Medical): No    Lack of Transportation (Non-Medical): No  Physical Activity: Not on file  Stress: Not on file  Social Connections: Not on file     Family History: The patient's ***family history includes Anesthesia problems  in his sister; Asthma in his sister; Diabetes in his mother; Heart attack in his father; Kidney cancer in his mother; Kidney disease in his mother; Sarcoidosis in his niece and sister. There is no history of Colon cancer, Colon polyps, Rectal cancer, or Stomach cancer.  ROS:   Please see the history of present illness.    *** All other systems reviewed and are negative.  Labs/Other Studies Reviewed:    The following studies were reviewed today:  Echo 05/13/21  1. Left ventricular ejection fraction, by estimation, is 55 to 60%. The  left ventricle has normal function. The left ventricle has no regional  wall motion abnormalities. Left ventricular diastolic parameters are  indeterminate. Elevated left ventricular  end-diastolic pressure.  2. Right ventricular systolic function is normal. The right ventricular  size is normal.   3. The mitral valve is normal in structure. No evidence of mitral valve  regurgitation. No evidence of mitral stenosis.   4. The aortic valve is tricuspid. Aortic valve regurgitation is not  visualized. Aortic valve sclerosis/calcification is present, without any  evidence of aortic stenosis.   5. The inferior vena cava is normal in size with greater than 50%  respiratory variability, suggesting right atrial pressure of 3 mmHg.      Recent Labs: 09/14/2021: TSH 0.829 10/19/2021: ALT 18; B Natriuretic Peptide 118.7 10/20/2021: Magnesium 1.9 10/21/2021: BUN 25; Creatinine, Ser 1.10; Hemoglobin 10.8; Platelets 304; Potassium 3.6; Sodium 139  Recent Lipid Panel    Component Value Date/Time   CHOL 134 01/26/2021 1012   TRIG 173 (H) 01/26/2021 1012   HDL 28 (L) 01/26/2021 1012   CHOLHDL 4.8 01/26/2021 1012   CHOLHDL 11.4 08/15/2008 0408   VLDL 41 (H) 08/15/2008 0408   LDLCALC 76 01/26/2021 1012     Risk Assessment/Calculations:   {Does this patient have ATRIAL FIBRILLATION?[phone]}       Physical Exam:    VS:  There were no vitals taken for this  visit.    Wt Readings from Last 3 Encounters:  10/19/21 202 lb (91.6 kg)  10/01/21 213 lb 10 oz (96.9 kg)  09/13/21 203 lb 9.6 oz (92.4 kg)     GEN: *** Well nourished, well developed in no acute distress HEENT: Normal NECK: No JVD; No carotid bruits CARDIAC: ***RRR, no murmurs, rubs, gallops RESPIRATORY:  Clear to auscultation without rales, wheezing or rhonchi  ABDOMEN: Soft, non-tender, non-distended MUSCULOSKELETAL:  No edema; No deformity. *** pedal pulses, ***bilaterally SKIN: Warm and dry NEUROLOGIC:  Alert and oriented x 3 PSYCHIATRIC:  Normal affect   EKG:  EKG is *** ordered today.  The ekg ordered today demonstrates ***  No BP recorded.  {Refresh Note OR Click here to enter BP  :1}***    Diagnoses:    No diagnosis found. Assessment and Plan:     CAD Frequent PVCs Chronic HFpEF: LVEF 55-60% on limited echo 10/21/21. Volume   Hypertension: Hyperlipidemia: LDL 76 on 01/26/21 CKD Hypokalemia: K+ 3.6 on 10/21/21 Pulmonary sarcoidosis:   {Are you ordering a CV Procedure (e.g. stress test, cath, DCCV, TEE, etc)?   Press F2        :[id number]}   Disposition:  Medication Adjustments/Labs and Tests Ordered: Current medicines are reviewed at length with the patient today.  Concerns regarding medicines are outlined above.  No orders of the defined types were placed in this encounter.  No orders of the defined types were placed in this encounter.   There are no Patient Instructions on file for this visit.   Signed, Emmaline Life, NP  11/11/2021 10:59 AM    [place]

## 2021-11-16 ENCOUNTER — Ambulatory Visit: Payer: Medicare HMO | Admitting: Internal Medicine

## 2021-11-22 ENCOUNTER — Ambulatory Visit: Payer: Medicare HMO | Admitting: Nurse Practitioner

## 2021-11-23 DIAGNOSIS — D869 Sarcoidosis, unspecified: Secondary | ICD-10-CM | POA: Diagnosis not present

## 2021-11-23 DIAGNOSIS — J019 Acute sinusitis, unspecified: Secondary | ICD-10-CM | POA: Diagnosis not present

## 2021-12-02 DIAGNOSIS — X32XXXD Exposure to sunlight, subsequent encounter: Secondary | ICD-10-CM | POA: Diagnosis not present

## 2021-12-02 DIAGNOSIS — L570 Actinic keratosis: Secondary | ICD-10-CM | POA: Diagnosis not present

## 2021-12-02 DIAGNOSIS — C4442 Squamous cell carcinoma of skin of scalp and neck: Secondary | ICD-10-CM | POA: Diagnosis not present

## 2021-12-02 DIAGNOSIS — Z85828 Personal history of other malignant neoplasm of skin: Secondary | ICD-10-CM | POA: Diagnosis not present

## 2021-12-02 DIAGNOSIS — Z08 Encounter for follow-up examination after completed treatment for malignant neoplasm: Secondary | ICD-10-CM | POA: Diagnosis not present

## 2021-12-06 DIAGNOSIS — N1831 Chronic kidney disease, stage 3a: Secondary | ICD-10-CM | POA: Diagnosis not present

## 2021-12-06 DIAGNOSIS — I25118 Atherosclerotic heart disease of native coronary artery with other forms of angina pectoris: Secondary | ICD-10-CM | POA: Diagnosis not present

## 2021-12-06 DIAGNOSIS — R001 Bradycardia, unspecified: Secondary | ICD-10-CM | POA: Diagnosis not present

## 2021-12-06 DIAGNOSIS — D869 Sarcoidosis, unspecified: Secondary | ICD-10-CM | POA: Diagnosis not present

## 2021-12-06 DIAGNOSIS — I5032 Chronic diastolic (congestive) heart failure: Secondary | ICD-10-CM | POA: Diagnosis not present

## 2021-12-06 DIAGNOSIS — I10 Essential (primary) hypertension: Secondary | ICD-10-CM | POA: Diagnosis not present

## 2021-12-27 ENCOUNTER — Telehealth: Payer: Self-pay | Admitting: Gastroenterology

## 2021-12-27 NOTE — Telephone Encounter (Signed)
I have spoken with the pt and he tells me that he was told he had blood in his stool and not sure he should wait until 11/27.  His appt with Dr Carlean Purl was moved to 11/27 with [facility]  He says he sees no blood, his stools are not dark but they are loose.  He has no other symptoms.  I have asked him to call our office if he develops obvious blood or other symptoms.  He will call to see if we have had any cancellations in the meantime.  He will also go to the ED if he develops bleeding that is significant.    Dr Carlean Purl are there any other recommendations for this pt?

## 2021-12-27 NOTE — Telephone Encounter (Signed)
11/8 afternoon is open - Sheri blocked it to reschedule my 11/14 patients

## 2021-12-27 NOTE — Telephone Encounter (Signed)
Inbound call from patient stating that we had called to reschedule his appointment with Dr. Carlean Purl on 11/14 at 10:30. I advised patient that Dr. Carlean Purl was scheduling in December and he stated that he needed something sooner than December. I offered next appointment with APP and patient stated that he needs to be seen because he is having blood in his stool. Please advise

## 2021-12-27 NOTE — Telephone Encounter (Signed)
The pt has been rescheduled to 01/12/22 at 130 pm with Dr Carlean Purl.  He will call if there are any concerns prior.    Thank you Dr Carlean Purl

## 2021-12-27 NOTE — Progress Notes (Signed)
Office Visit    Patient Name: [name] Date of Encounter: 12/28/2021  PCP:  Shirline Frees, [name]  Cardiologist:  Candee Furbish, MD  Advanced Practice Provider:  No care team member to display Electrophysiologist:  None    Chief Complaint    [name] is a 76 y.o. male with a hx of CAD status post DES to the LAD and PDA August 2011, DES x2 to the R PDA June 2774, grade 1 diastolic dysfunction, pulmonary sarcoidosis, history of GI bleed, adrenal insufficiency on chronic steroids, hypertension, hyperlipidemia, PAD with left tibial disease treated medically, OSA, and CKD presents today for a 79-monthfollow-up appointment.  Past Medical History    Past Medical History:  Diagnosis Date   Adrenal insufficiency ([place]    Allergy    Anemia    Arthritis    feet    Broken foot 09/2019   had to wore a boot. Left    CKD (chronic kidney disease), stage III (HCC)    Coronary artery disease    has stents   Coronary atherosclerosis of native coronary artery    Proximal LAD, posterior lateral stent widely patent-10/12/11   Diabetes mellitus    insulin and pills   Hearing loss    wears hearing aids   History of blood transfusion 06/30/2016   [facility]- 2 units transfused   Hyperlipidemia    Hypertension    OSA (obstructive sleep apnea)    uses VPAC sleep study 2 years done through [facility]. SMarlou Porcharranged study   PVD (peripheral vascular disease) ([place]    Sarcoidosis    Sleep apnea    uses CPAP nightly   Thyroid disease    Type 2 diabetes mellitus ([place]    Past Surgical History:  Procedure Laterality Date   APPENDECTOMY     CATARACT EXTRACTION  2011   bilat   CHOLECYSTECTOMY  01/25/2011   Procedure: LAPAROSCOPIC CHOLECYSTECTOMY WITH INTRAOPERATIVE CHOLANGIOGRAM;  Surgeon: BJudieth Keens DO;  [place] [facility]  Service: General;  Laterality: N/A;   COLONOSCOPY     several   COLONOSCOPY WITH PROPOFOL N/A 01/03/2020   Procedure:  COLONOSCOPY WITH PROPOFOL;  Surgeon: GGatha Mayer MD;  [facility];  Service: Endoscopy;  Laterality: N/A;   CORONARY ANGIOPLASTY     most recent 11/2009   CORONARY STENT INTERVENTION N/A 08/07/2018   Procedure: CORONARY STENT INTERVENTION;  Surgeon: VJettie Booze MD;  [place] [facility];  Service: Cardiovascular;  Laterality: N/A;   CORONARY STENT INTERVENTION N/A 01/18/2021   Procedure: CORONARY STENT INTERVENTION;  Surgeon: CSherren Mocha MD;  [facility];  Service: Cardiovascular;  Laterality: N/A;   CORONARY STENT PLACEMENT  2009   in LAD and side branch PTCA   ESOPHAGOGASTRODUODENOSCOPY (EGD) WITH PROPOFOL N/A 01/03/2020   Procedure: ESOPHAGOGASTRODUODENOSCOPY (EGD) WITH PROPOFOL;  Surgeon: GGatha Mayer MD;  [facility];  Service: Endoscopy;  Laterality: N/A;   HEMOSTASIS CLIP PLACEMENT  01/03/2020   Procedure: HEMOSTASIS CLIP PLACEMENT;  Surgeon: GGatha Mayer MD;  [facility];  Service: Endoscopy;;   INTERCOSTAL NERVE BLOCK  2011, 06/2016   x2. lumbar spine   INTRAVASCULAR PRESSURE WIRE/FFR STUDY N/A 01/18/2021   Procedure: INTRAVASCULAR PRESSURE WIRE/FFR STUDY;  Surgeon: CSherren Mocha MD;  [place] [facility] [place];  Service: Cardiovascular;  Laterality: N/A;   LEFT HEART CATHETERIZATION WITH CORONARY ANGIOGRAM Bilateral 10/12/2011   Procedure:  LEFT HEART CATHETERIZATION WITH CORONARY ANGIOGRAM;  Surgeon: Candee Furbish, MD;  Location[facility] C[facility] LAB;  Service: Cardiovascular;  Laterality: Bilateral;   LEFT HEART CATHETERIZATION WITH CORONARY ANGIOGRAM N/A 04/01/2014   Procedure: LEFT HEART CATHETERIZATION WITH CORONARY ANGIOGRAM;  Surgeon: Candee Furbish, MD;  Location: [facility] C[facility] LAB;  Service: Cardiovascular;  Laterality: N/A;   POLYPECTOMY  01/03/2020   Procedure: POLYPECTOMY;  Surgeon: Craig Mayer, MD;  [facility];  Service: Endoscopy;;   RIGHT/LEFT HEART CATH AND CORONARY ANGIOGRAPHY N/A 08/07/2018    Procedure: RIGHT/LEFT HEART CATH AND CORONARY ANGIOGRAPHY;  Surgeon: Craig Booze, MD;  Location: [place] [facility];  Service: Cardiovascular;  Laterality: N/A;   RIGHT/LEFT HEART CATH AND CORONARY ANGIOGRAPHY N/A 12/25/2020   Procedure: RIGHT/LEFT HEART CATH AND CORONARY ANGIOGRAPHY;  Surgeon: Craig Crome, MD;  Location: [place] [facility];  Service: Cardiovascular;  Laterality: N/A;   TRANSFORAMINAL LUMBAR INTERBODY FUSION W/ MIS 1 LEVEL N/A 09/24/2021   Procedure: LUMBAR FOUR-FIVE MINIMALLY INVASIVE (MIS) TRANSFORAMINAL LUMBAR INTERBODY FUSION (TLIF) WITH METRX;  Surgeon: Craig Part, MD;  Location: [place];  Service: Neurosurgery;  Laterality: N/A;    Allergies  Allergies  Allergen Reactions   Statins Other (See Comments)    Pt says they make him "mean"   Hydrocortisone Nausea Only   Ranexa [Ranolazine] Other (See Comments)    Severe weakness/near syncope after 1 dose Hallucinations    Hydrocodone Nausea And Vomiting   Miralax [Polyethylene Glycol] Nausea And Vomiting   Z-Pak [Azithromycin] Other (See Comments)    Raises blood sugar   Zaroxolyn [Metolazone] Other (See Comments)    Drained, no energy    History of Present Illness    [name] is a 76 y.o. male with a hx of CAD status post DES to the LAD and PDA August 2011, DES x2 to the R PDA June 2992, grade 1 diastolic dysfunction, pulmonary sarcoidosis, history of GI bleed, adrenal insufficiency on chronic steroids, hypertension, hyperlipidemia, PAD with left tibial disease treated medically, OSA, and CKD presents today for a hospital follow-up appointment.   He was seen in the office on 10/2019 with [name] with complaints of dyspnea on exertion and chest pain but was also noted to be Hemoccult positive and undergoing GI work-up. He was admitted with a GI bleed 12/2019 and required transfusion. Endoscopy showed small amount of blood in the duodenal bulb and no active bleeding with 2 small polyps removed.  Colonoscopy with no active bleeding. He was resumed on his aspirin/Plavix.   Later had 2 surgeries on his left foot [facility] for Charcot's foot and osteomyelitis.  Was last seen in the office on 12/2020 with Dr. Marlou [name] and complained of chest pain and dyspnea on exertion for 2 months prior to visit.  He was sent for outpatient cardiac catheterization which noted diffuse three-vessel CAD with moderate LAD disease beyond stented segment, moderate severe diagonal disease, 70% ramus and new 75 to 80% proximal (RFR 0.90)  and 60% mid distal RCA lesion (RFR 0.91).  Recommendations were conservative management with medical therapy along with initiation of SGLT2 therapy.  Notes indicate RCA could be treated although no physiologic evidence to support doing so the day of cath. Patient called back to the office with recurrent chest pain and spoke with Dr. Marlou [name] regarding plan and he was set up for relook outpatient cardiac cath.   Patient underwent cardiac catheterization and subsequently underwent successful two-vessel PCI guided FFR/R [place] with stenting of  severe stenosis of the proximal to mid RCA along with severe stenosis of the first diagonal branch of the LAD.  DES x2 to RCA along with DES x1 to diagonal branch.  Plan for DAPT with ASA/Plavix for at least 6 months but likely longer if patient tolerates well.  We are seeing him today for follow-up after stent placement.  He was seen by me last year 01/26/2021 and he is having some positional dizziness which is also notices during activity. His breathing is better.  He is having some dizziness with activity and positional dizziness.  His blood pressure at home is running 371G to 626R systolic.  He does notice occasionally that his heart beats a little faster during these episodes.  He told me that he had an arrhythmia at the time of his last stent placement but has never worn a monitor.  He has no chest pain and overall feels well and is walking further than he  ever has.  He has been breathing much better ever since his stent placement.  His activity has been somewhat limited due to his foot surgery and walking in a boot.  He stated that he has been dealing with his foot issue for over a year.  His appetite has been picking up and he has been eating a lot of salad and toast with cheese.  He was given a heart healthy diet education packet before he left the hospital.  He plans to do cardiac rehab however [place] has a wait list of 3 months.  He expressed interest in doing his rehab over at [facility].  He states he has done his rehab over there in the past for his heart.  Our office is working on sending a referral there.  We will go ahead and order a fasting lipid panel as well as LFTs today since he has not had those labs in 2 years.  He has been taking his Crestor and tolerating it.  He will continue on dual antiplatelet therapy for at least 6 months.  Today, he states that he currently does not have a CPAP machine and has been off of it for 2 months.  He feels like his shortness of breath is worse due to this.  He also has been taking Lasix 80 mg once a day for his lower extremity edema.  This has been helping.  He states that he had back surgery and still dealing with back spasms but has done some physical therapy on his own.  Patient and his wife are worried about a monitor that he will back in August but they never heard the results from.  No results available in chart.  Will order another ZIO x3 days for PVCs.  Patient also states he is on iron for history of anemia.  We will order some labs to check on this today.  He also endorses shortness of breath for the last several months.  I believe this to be multifactorial.  He had several bouts of pneumonia as well as bronchitis was in the hospital.  He also states that from his back surgery he has been less active as of late so there surely is a component of exercise intolerance involved.  He also has a  history of pulm sarcoidosis.  Recent echocardiogram reviewed and has been stable.  His ischemic equivalent is chest pain and shortness of breath.  He denies chest pain.  Reports no chest pain, pressure, or tightness. No orthopnea, PND. Reports  no palpitations.   EKGs/Labs/Other Studies Reviewed:   The following studies were reviewed today:  Echocardiogram 10/21/2021  IMPRESSIONS     1. Limited echo for LV function   2. Left ventricular ejection fraction, by estimation, is 55 to 60%. The  left ventricle has normal function. The left ventricle has no regional  wall motion abnormalities.   3. The inferior vena cava is dilated in size with >50% respiratory  variability, suggesting right atrial pressure of 8 mmHg.   Comparison(s): No significant change from prior study. 05/13/2021: LVEF  55-60%.   FINDINGS   Left Ventricle: Left ventricular ejection fraction, by estimation, is 55  to 60%. The left ventricle has normal function. The left ventricle has no  regional wall motion abnormalities.   Pericardium: Trivial pericardial effusion is present. The pericardial  effusion is posterior to the left ventricle.   Venous: The inferior vena cava is dilated in size with greater than 50%  respiratory variability, suggesting right atrial pressure of 8 mmHg.   Lyman Bishop MD  Electronically signed by Lyman Bishop MD  Signature Date/Time: 10/21/2021/10:43:08 AM   Cardic Cath with PCI 01/18/21    Prox LAD-1 lesion is 40% stenosed.   Prox LAD-2 lesion is 65% stenosed.   Mid Cx lesion is 50% stenosed.   Mid RCA lesion is 60% stenosed.   Prox RCA lesion is 75% stenosed.   Ost Ramus to Ramus lesion is 70% stenosed.   Ost 1st Diag lesion is 80% stenosed.   Non-stenotic Ost RPDA lesion was previously treated.   Non-stenotic Ost RPDA to RPDA lesion was previously treated.   Non-stenotic RPAV lesion was previously treated.   A drug-eluting stent was successfully placed using a STENT ONYX  FRONTIER 3.5X15.   A drug-eluting stent was successfully placed using a STENT ONYX FRONTIER 3.5X18.   A drug-eluting stent was successfully placed using a STENT ONYX FRONTIER 2.5X18.   Post intervention, there is a 0% residual stenosis.   Post intervention, there is a 0% residual stenosis.   Post intervention, there is a 0% residual stenosis.   Successful two-vessel PCI guided by RFR/FFR with stenting of severe stenoses in the proximal and mid RCA and severe stenosis in the first diagonal branch of the LAD.  All lesions treated with Onyx frontier drug-eluting stents (total of 3 stents implanted).  Patient should be treated with dual antiplatelet therapy with aspirin and clopidogrel for minimum of 6 months, favor long-term therapy if well-tolerated considering his multivessel stenting.  Same-day PCI protocol.    07/27/2018 Echocardiogram   1. The left ventricle has normal systolic function with an ejection  fraction of 60-65%. The cavity size was normal. There is mildly increased  left ventricular wall thickness. Left ventricular diastolic Doppler  parameters are consistent with impaired  relaxation.   2. The right ventricle has normal systolic function. The cavity was  normal. There is no increase in right ventricular wall thickness.   3. The aortic valve is tricuspid. Mild thickening of the aortic valve.  Mild calcification of the aortic valve.   FINDINGS   Left Ventricle: The left ventricle has normal systolic function, with an  ejection fraction of 60-65%. The cavity size was normal. There is mildly  increased left ventricular wall thickness. Left ventricular diastolic  Doppler parameters are consistent  with impaired relaxation. Normal left ventricular filling pressures   Right Ventricle: The right ventricle has normal systolic function. The  cavity was normal. There is no increase in right  ventricular wall  thickness.   Left Atrium: Left atrial size was normal in size.   Right  Atrium: Right atrial size was normal in size. Right atrial pressure  is estimated at 10 mmHg.   Interatrial Septum: No atrial level shunt detected by color flow Doppler.   Pericardium: There is no evidence of pericardial effusion. There is a  pericardial fat pad noted.   Mitral Valve: The mitral valve is normal in structure. Mitral valve  regurgitation is not visualized by color flow Doppler.   Tricuspid Valve: The tricuspid valve is normal in structure. Tricuspid  valve regurgitation is trivial by color flow Doppler.   Aortic Valve: The aortic valve is tricuspid Mild thickening of the aortic  valve. Mild calcification of the aortic valve. Aortic valve regurgitation  was not visualized by color flow Doppler. There is no evidence of aortic  valve stenosis.   Pulmonic Valve: The pulmonic valve was not well visualized. Pulmonic valve  regurgitation is not visualized by color flow Doppler.   Venous: The inferior vena cava measures 1.10 cm, is normal in size with  greater than 50% respiratory variability.   EKG:  EKG was ordered in the hospital on 01/19/2021 and showed normal sinus rhythm with an old right bundle branch block.  Recent Labs: 09/14/2021: TSH 0.829 10/19/2021: ALT 18; B Natriuretic Peptide 118.7 10/20/2021: Magnesium 1.9 10/21/2021: BUN 25; Creatinine, Ser 1.10; Hemoglobin 10.8; Platelets 304; Potassium 3.6; Sodium 139  Recent Lipid Panel    Component Value Date/Time   CHOL 134 01/26/2021 1012   TRIG 173 (H) 01/26/2021 1012   HDL 28 (L) 01/26/2021 1012   CHOLHDL 4.8 01/26/2021 1012   CHOLHDL 11.4 08/15/2008 0408   VLDL 41 (H) 08/15/2008 0408   LDLCALC 76 01/26/2021 1012    Home Medications   Current Meds  Medication Sig   acetaminophen (TYLENOL) 500 MG tablet Take 1,000 mg by mouth daily as needed for mild pain (pain).   albuterol (PROVENTIL) (2.5 MG/3ML) 0.083% nebulizer solution Take 3 mLs (2.5 mg total) by nebulization every 6 (six) hours as needed for  wheezing or shortness of breath. Dx Code D86.0   Albuterol Sulfate (PROAIR RESPICLICK) 606 (90 BASE) MCG/ACT AEPB Inhale 2 puffs into the lungs every 6 (six) hours as needed. (Patient taking differently: Inhale 2 puffs into the lungs as needed (for shortness of breath).)   aspirin EC 81 MG tablet Take 81 mg by mouth every evening.    Cholecalciferol (VITAMIN D3) 3000 UNITS TABS Take 3,000 Units by mouth daily.   clopidogrel (PLAVIX) 75 MG tablet Take 1 tablet (75 mg total) by mouth daily. Restart on 09/28/21   Coenzyme Q10 (COQ-10) 400 MG CAPS Take 400 mg by mouth 2 (two) times a week. Sunday, Wednesday   ezetimibe (ZETIA) 10 MG tablet Take 10 mg by mouth once a week.   furosemide (LASIX) 80 MG tablet Take 1 tablet (80 mg total) by mouth daily as needed for edema (take Lasix 80 mg as needed if gains more than 3 pounds in 1 day or 5 pounds in 1 week).   hydrALAZINE (APRESOLINE) 10 MG tablet Take 1 tablet (10 mg total) by mouth every 8 (eight) hours. (Patient taking differently: Take 10 mg by mouth 3 (three) times daily.)   insulin aspart protamine- aspart (NOVOLOG MIX 70/30) (70-30) 100 UNIT/ML injection Inject 0.4 mLs (40 Units total) into the skin with breakfast, with lunch, and with evening meal. (Patient taking differently: Inject 25-35 Units into the skin  with breakfast, with lunch, and with evening meal.)   levothyroxine (SYNTHROID) 75 MCG tablet Take 75 mcg by mouth daily.   montelukast (SINGULAIR) 10 MG tablet Take 10 mg by mouth daily as needed (allergies).    Multiple Vitamin (MULTIVITAMIN WITH MINERALS) TABS tablet Take 1 tablet by mouth daily.   OVER THE COUNTER MEDICATION Take 1 tablet by mouth daily. Omega XL   potassium chloride (KLOR-CON M) 10 MEQ tablet Take 10 mEq by mouth daily.   predniSONE (DELTASONE) 10 MG tablet Take PO 4 tabs daily x 2 days,3 tabs daily x 2 days,2 tabs daily x 2 days,then resume home dose 1 tab daily (Patient taking differently: Take 10 mg by mouth daily.)    RABEprazole (ACIPHEX) 20 MG tablet Take 20 mg by mouth daily.   rosuvastatin (CRESTOR) 20 MG tablet Take 20 mg by mouth daily.       Physical Exam    VS:  BP 120/70   Pulse 84   Ht '5\' 10"'$  (1.778 m)   Wt 222 lb 12.8 oz (101.1 kg)   SpO2 95%   BMI 31.97 kg/m  , BMI Body mass index is 31.97 kg/m.  Wt Readings from Last 3 Encounters:  12/28/21 222 lb 12.8 oz (101.1 kg)  10/19/21 202 lb (91.6 kg)  10/01/21 213 lb 10 oz (96.9 kg)     GEN: Well nourished, well developed, in no acute distress. HEENT: normal. Neck: Supple, no JVD, carotid bruits, or masses. Cardiac: RRR, no murmurs, rubs, or gallops. No clubbing, cyanosis, 1-2 + pitting bilateral edema.  Radials/PT 2+ and equal bilaterally.  Respiratory:  Respirations regular and unlabored, clear to auscultation bilaterally. GI: Soft, nontender, nondistended. MS: No deformity or atrophy. Skin: Warm and dry, no rash. Neuro:  Strength and sensation are intact. Psych: Normal affect.  Assessment & Plan    Multifactorial shortness of breath/chronic diastolic heart failure-he states that his albuterol does not help with the shortness of breath.  He has been taking 80 of Lasix daily due to lower extremity edema.  This does help.  He has had a few episodes of pneumonia and bronchitis.  He also had recent back surgery where his activity level was severely limited.  There might be a factor of exercise intolerance involved as well.  We will order a BMP due to being on higher dose of Lasix to check his kidneys.  He also has been anemic and has needed iron transfusions.  Most recent echocardiogram reviewed with the patient.  When he had his stents placed he had both chest pain and shortness of breath and this is his ischemic equivalent.  CT scan negative for PE.  CAD status post successful two-vessel PCI with DES x2 to RCA along with DES x1 to diagonal branch-No recurrent chest pain. Continue with DAPT, ASA/Plavix for at least 6 months but likely  longer if the patient tolerates. (He has been on for almost a year) GDMT: statin, asa/plavix. Is not taking a BB or ACEI.  Last stent placement November 2022.  Remains on DAPT.  Will discuss with Dr. Marlou [name] how long he will need to continue.  Palpitations and positional dizziness-3 day Zio for PVCs ordered back in August but no results.  We will need to order new 3-day monitor and will review with patient at next appointment.  Hypertension-well-controlled on current medical regiment.  Continue Norvasc and Cozaar.  Continue to check blood pressure daily at home.  Blood pressures at home have been running 440N-027O systolic.  Hyperlipidemia-continue Crestor.  Last lipid panel from 02/2021 revealed LDL of 33, total cholesterol 105, triglycerides 305.  Updated lipid panel and LFTs ordered today.  If triglycerides remain elevated will need to escalate his medical therapy.  Continue Crestor 20 mg daily and Zetia 10 mg daily.  History of GI bleed-Last year when he was evaluated for stents, found to be anemic, two bleeding polyps and one was taken out. Two iron infusions. No bloody stools or urine. Update CBC.  Follow-up planned with GI November 8th.   PAD-continue current medical treatment  Obesity-reviewed low-sodium, heart healthy diet.  Continue daily exercise with a goal of 30 minutes x 5 days a week. He is somewhat limited due to his foot and wearing a boot for over a year.   OSA without CPAP -will contact Dorthea Jones and see if she can set him up  Disposition: Follow up 4 weeks with Candee Furbish, MD or APP.  Signed, Elgie Collard, PA-C 12/28/2021, 12:06 PM [facility]

## 2021-12-28 ENCOUNTER — Telehealth: Payer: Self-pay | Admitting: *Deleted

## 2021-12-28 ENCOUNTER — Encounter: Payer: Self-pay | Admitting: Physician Assistant

## 2021-12-28 ENCOUNTER — Ambulatory Visit: Payer: Medicare HMO | Attending: Physician Assistant | Admitting: Physician Assistant

## 2021-12-28 ENCOUNTER — Ambulatory Visit (INDEPENDENT_AMBULATORY_CARE_PROVIDER_SITE_OTHER): Payer: Medicare HMO

## 2021-12-28 VITALS — BP 120/70 | HR 84 | Ht 70.0 in | Wt 222.8 lb

## 2021-12-28 DIAGNOSIS — K264 Chronic or unspecified duodenal ulcer with hemorrhage: Secondary | ICD-10-CM | POA: Diagnosis not present

## 2021-12-28 DIAGNOSIS — I2510 Atherosclerotic heart disease of native coronary artery without angina pectoris: Secondary | ICD-10-CM | POA: Diagnosis not present

## 2021-12-28 DIAGNOSIS — I5032 Chronic diastolic (congestive) heart failure: Secondary | ICD-10-CM | POA: Diagnosis not present

## 2021-12-28 DIAGNOSIS — E785 Hyperlipidemia, unspecified: Secondary | ICD-10-CM | POA: Diagnosis not present

## 2021-12-28 DIAGNOSIS — R002 Palpitations: Secondary | ICD-10-CM | POA: Diagnosis not present

## 2021-12-28 DIAGNOSIS — I493 Ventricular premature depolarization: Secondary | ICD-10-CM | POA: Diagnosis not present

## 2021-12-28 DIAGNOSIS — I739 Peripheral vascular disease, unspecified: Secondary | ICD-10-CM | POA: Diagnosis not present

## 2021-12-28 DIAGNOSIS — G4733 Obstructive sleep apnea (adult) (pediatric): Secondary | ICD-10-CM | POA: Diagnosis not present

## 2021-12-28 NOTE — Telephone Encounter (Signed)
Tessa- good morning. this patient says he was suppose to be set up with CPAP and his SOB is worse because he has been without for 2 months. Would you be able to contact him and see where he is in the process? Me- good morning I have no note on him under Dr Radford Pax or myself. I don't know how to help me. Johann Capers- it sounds like something was dropped at his last hospitalization he was suppose to get it set up then but the ball was dropped maybe you could give him a call and see where he is in the process it sounded like he already had a sleep study to me and he was waiting on his CPAP Me- His study was done through [facility]. I can not see the study Sleep study done at [facility] 10/03/2008 with Dr Radford Pax. He has never seen her here. It was recommended he have a titration to treat his 327 apneas at that time. He will need another study at this time. Johann Capers- can he do an at home sleep or does he need to come to sleep lab? Me- Yes can do a home sleep Tessa- we will get with carol when she is here to order. thanks!

## 2021-12-28 NOTE — Patient Instructions (Signed)
Medication Instructions:  Your physician recommends that you continue on your current medications as directed. Please refer to the Current Medication list given to you today.  *If you need a refill on your cardiac medications before your next appointment, please call your pharmacy*   Lab Work: CBC and BMP today Schedule an appointment to have a fasting lipid panel and lft's If you have labs (blood work) drawn today and your tests are completely normal, you will receive your results only by: [place] (if you have MyChart) OR A paper copy in the mail If you have any lab test that is abnormal or we need to change your treatment, we will call you to review the results.   Testing/Procedures: [place]- Long Term Monitor Instructions  Your physician has requested you wear a ZIO patch monitor for 3 days.  This is a single patch monitor. [employer] supplies one patch monitor per enrollment. Additional stickers are not available. Please do not apply patch if you will be having a Nuclear Stress Test,  Echocardiogram, Cardiac CT, MRI, or Chest Xray during the period you would be wearing the  monitor. The patch cannot be worn during these tests. You cannot remove and re-apply the  ZIO XT patch monitor.  Your ZIO patch monitor will be mailed 3 day USPS to your address on file. It may take 3-5 days  to receive your monitor after you have been enrolled.  Once you have received your monitor, please review the enclosed instructions. Your monitor  has already been registered assigning a specific monitor serial # to you.  Billing and Patient Assistance Program Information  We have supplied [employer] with any of your insurance information on file for billing purposes. [employer] offers a sliding scale Patient Assistance Program for patients that do not have  insurance, or whose insurance does not completely cover the cost of the ZIO monitor.  You must apply for the Patient Assistance Program to qualify for  this discounted rate.  To apply, please call [employer] at [phone], select option 4, select option 2, ask to apply for  Patient Assistance Program. Theodore Demark will ask your household income, and how many people  are in your household. They will quote your out-of-pocket cost based on that information.  [employer] will also be able to set up a 85-month interest-free payment plan if needed.  Applying the monitor   Shave hair from upper left chest.  Hold abrader disc by orange tab. Rub abrader in 40 strokes over the upper left chest as  indicated in your monitor instructions.  Clean area with 4 enclosed alcohol pads. Let dry.  Apply patch as indicated in monitor instructions. Patch will be placed under collarbone on left  side of chest with arrow pointing upward.  Rub patch adhesive wings for 2 minutes. Remove white label marked "1". Remove the white  label marked "2". Rub patch adhesive wings for 2 additional minutes.  While looking in a mirror, press and release button in center of patch. A small green light will  flash 3-4 times. This will be your only indicator that the monitor has been turned on.  Do not shower for the first 24 hours. You may shower after the first 24 hours.  Press the button if you feel a symptom. You will hear a small click. Record Date, Time and  Symptom in the Patient Logbook.  When you are ready to remove the patch, follow instructions on the last 2 pages of Patient  Logbook. Stick  patch monitor onto the last page of Patient Logbook.  Place Patient Logbook in the blue and white box. Use locking tab on box and tape box closed  securely. The blue and white box has prepaid postage on it. Please place it in the mailbox as  soon as possible. Your physician should have your test results approximately 7 days after the  monitor has been mailed back to [facility].  Call [place] at [phone] if you have questions regarding  your ZIO XT patch monitor.  Call them immediately if you see an orange light blinking on your  monitor.  If your monitor falls off in less than 4 days, contact our Monitor department at [phone].  If your monitor becomes loose or falls off after 4 days call [employer] at [phone] for  suggestions on securing your monitor    Follow-Up: At [facility], you and your health needs are our priority.  As part of our continuing mission to provide you with exceptional heart care, we have created designated Provider Care Teams.  These Care Teams include your primary Cardiologist (physician) and Advanced Practice Providers (APPs -  Physician Assistants and Nurse Practitioners) who all work together to provide you with the care you need, when you need it.  We recommend signing up for the patient portal called "MyChart".  Sign up information is provided on this After Visit Summary.  MyChart is used to connect with patients for Virtual Visits (Telemedicine).  Patients are able to view lab/test results, encounter notes, upcoming appointments, etc.  Non-urgent messages can be sent to your provider as well.   To learn more about what you can do with MyChart, go to [email or web address].    Your next appointment:   1 month(s)  The format for your next appointment:   In Person  Provider:   Candee Furbish, MD  or APP  Important Information About Sugar

## 2021-12-28 NOTE — Progress Notes (Unsigned)
ZIO XT serial # [id number] from office inventory applied in office.   Dr. Marlou Porch to read.

## 2021-12-29 ENCOUNTER — Telehealth: Payer: Self-pay | Admitting: *Deleted

## 2021-12-29 LAB — CBC
Hematocrit: 37.9 % (ref 37.5–51.0)
Hemoglobin: 11.6 g/dL — ABNORMAL LOW (ref 13.0–17.7)
MCH: 27.5 pg (ref 26.6–33.0)
MCHC: 30.6 g/dL — ABNORMAL LOW (ref 31.5–35.7)
MCV: 90 fL (ref 79–97)
Platelets: 327 10*3/uL (ref 150–450)
RBC: 4.22 x10E6/uL (ref 4.14–5.80)
RDW: 15.7 % — ABNORMAL HIGH (ref 11.6–15.4)
WBC: 10.5 10*3/uL (ref 3.4–10.8)

## 2021-12-29 LAB — BASIC METABOLIC PANEL
BUN/Creatinine Ratio: 16 (ref 10–24)
BUN: 19 mg/dL (ref 8–27)
CO2: 25 mmol/L (ref 20–29)
Calcium: 9 mg/dL (ref 8.6–10.2)
Chloride: 102 mmol/L (ref 96–106)
Creatinine, Ser: 1.17 mg/dL (ref 0.76–1.27)
Glucose: 253 mg/dL — ABNORMAL HIGH (ref 70–99)
Potassium: 4.1 mmol/L (ref 3.5–5.2)
Sodium: 142 mmol/L (ref 134–144)
eGFR: 65 mL/min/{1.73_m2} (ref >59)

## 2021-12-29 NOTE — Telephone Encounter (Signed)
[name] will get him set up with the test and doctor Radford Pax will follow from there.

## 2021-12-29 NOTE — Addendum Note (Signed)
Addended by: Juventino Slovak on: 12/29/2021 05:27 PM   Modules accepted: Orders

## 2021-12-29 NOTE — Telephone Encounter (Signed)
-----   Message from Elgie Collard, [place] sent at 12/28/2021 12:34 PM EDT ----- We will need to get him set up with CPAP.  He has not had a machine for 2 months and was seen today with worsening shortness of breath.  Please let me know what other information you need to get this done.  Thanks.  Elgie Collard, PA-C

## 2021-12-30 ENCOUNTER — Telehealth: Payer: Self-pay | Admitting: *Deleted

## 2021-12-30 NOTE — Telephone Encounter (Signed)
-----   Message from Juventino Slovak, [place] sent at 12/29/2021  5:27 PM EDT ----- Johann Capers saw this patient yesterday and he needs a new sleep study and she ordered for him to have an itamar. I have spoke with patient to discuss this, he does have a smart phone and he agrees with this plan. Stop bang score in and order placed. Thanks, Mindy

## 2021-12-30 NOTE — Telephone Encounter (Signed)
I tried to call the pt to set up a time to come by the office to set up Itamar, but no answer.

## 2021-12-30 NOTE — Telephone Encounter (Signed)
I s/w the pt and he will come in tomorrow for Itamar set up @ 1 pm.

## 2021-12-31 NOTE — Telephone Encounter (Signed)
Pt and his wife came in for me to set up Itamar sleep study. Pt aware to not open the box until he has been called with the PIN#. Pt agreeable to signed waiver. Pt thanked me for the help today.

## 2022-01-11 NOTE — Telephone Encounter (Signed)
Prior Authorization for [facility] sent to [facility] via web portal. Tracking Number . NO PA REQ

## 2022-01-11 NOTE — Telephone Encounter (Signed)
Patient is following up, requesting PIN#. Please advise.

## 2022-01-11 NOTE — Telephone Encounter (Signed)
S/w the pt and he is has been given PIN# [id number]. Called and made the patient aware that he may proceed with the [facility] Sleep Study. PIN # provided to the patient. Patient made aware that he will be contacted after the test has been read with the results and any recommendations. Patient verbalized understanding and thanked me for the call.   Pt states he will do study between tonight and this weekend.

## 2022-01-12 ENCOUNTER — Encounter (HOSPITAL_BASED_OUTPATIENT_CLINIC_OR_DEPARTMENT_OTHER): Payer: Medicare HMO | Admitting: Cardiology

## 2022-01-12 ENCOUNTER — Ambulatory Visit: Payer: Medicare HMO | Admitting: Internal Medicine

## 2022-01-12 ENCOUNTER — Encounter: Payer: Self-pay | Admitting: Internal Medicine

## 2022-01-12 ENCOUNTER — Telehealth: Payer: Self-pay

## 2022-01-12 VITALS — BP 114/60 | HR 66 | Ht 70.0 in | Wt 223.0 lb

## 2022-01-12 DIAGNOSIS — R195 Other fecal abnormalities: Secondary | ICD-10-CM

## 2022-01-12 DIAGNOSIS — Z9861 Coronary angioplasty status: Secondary | ICD-10-CM

## 2022-01-12 DIAGNOSIS — I493 Ventricular premature depolarization: Secondary | ICD-10-CM | POA: Diagnosis not present

## 2022-01-12 DIAGNOSIS — D508 Other iron deficiency anemias: Secondary | ICD-10-CM | POA: Diagnosis not present

## 2022-01-12 DIAGNOSIS — Z8719 Personal history of other diseases of the digestive system: Secondary | ICD-10-CM

## 2022-01-12 DIAGNOSIS — Z7902 Long term (current) use of antithrombotics/antiplatelets: Secondary | ICD-10-CM

## 2022-01-12 DIAGNOSIS — G4733 Obstructive sleep apnea (adult) (pediatric): Secondary | ICD-10-CM | POA: Diagnosis not present

## 2022-01-12 MED ORDER — NA SULFATE-K SULFATE-MG SULF 17.5-3.13-1.6 GM/177ML PO SOLN
1.0000 | Freq: Once | ORAL | 0 refills | Status: AC
Start: 2022-01-12 — End: 2022-01-12

## 2022-01-12 NOTE — Telephone Encounter (Signed)
Preoperative team, patient has upcoming clinic visit.  Please add preoperative cardiac evaluation to appointment notes.  I will defer preoperative cardiac evaluation to provider at that time.  We will remove patient from preoperative pool.  Thank you for your help.  Jossie Ng. Hildred Pharo NP-C     01/12/2022, [address] Office [phone] Fax [phone]

## 2022-01-12 NOTE — Telephone Encounter (Signed)
[facility] Pre-operative Risk Assessment     Request for surgical clearance:     Endoscopy Procedure  What type of surgery is being performed?     EGD & Colonoscopy  When is this surgery scheduled?     02/18/2022  What type of clearance is required ?   Pharmacy  Are there any medications that need to be held prior to surgery and how long? Plavix, 5 days  Practice name and name of physician performing surgery?      [facility]  What is your office phone and fax number?      Phone- [phone]  [phone]  Anesthesia type (None, local, MAC, general) ?       MAC

## 2022-01-12 NOTE — Progress Notes (Signed)
[name] 76 y.o. 04-Aug-1945 [id number]  Assessment & Plan:   Encounter Diagnoses  Name Primary?   Other iron deficiency anemia Yes   Heme + stool    History of gastric polyps    Long term current use of antithrombotics/antiplatelets    S/P PTCA (percutaneous transluminal coronary angioplasty)    Evaluate iron deficiency anemia recurrence and heme positive stools with EGD and colonoscopy.  It would be best to hold his clopidogrel prior.  5 days to reduce the risk of bleeding with any interventions that might occur.  We will clarify with cardiology.  I did explain the real but very rare risk of stent occlusion off of the clopidogrel.  Typical endoscopy and colonoscopy risks also reviewed with the patient and he has reviewed our informed consent and understands and agrees to proceed.  Orders Placed This Encounter  Procedures   Ferritin   CBC with Differential/Platelet   Ambulatory referral to Gastroenterology    CC: Shirline Frees, MD    Subjective:   Chief Complaint: Iron deficiency anemia and blood in stool  HPI 76 year old white man with numerous medical problems that include type 2 diabetes mellitus, obesity, obstructive sleep apnea, CAD status post PTCA on clopidogrel, peripheral vascular disease, hypertension and chronic kidney disease who is here because of recurrent iron deficiency anemia and blood in the stool.  He was hospitalized several times this year, he had back surgery he had pneumonia andAcute kidney injury with hospitalizations in May July and August.  Because of recurrent iron deficiency anemia he had iron infusions x2 at [facility] he tells me.  He has not noted any bleeding other than some dark stools 2 or 3 times lately.  No Pepto-Bismol.  No significant bowel habit changes other than some transient increased gastrocolic reflex this summer but his wife pointed out that was after multiple large doses of antibiotics.  He had coronary artery  stenting again in December 2022.  He follows with Dr. Marlou Porch but did get a second opinion regarding dyspnea and weakness from those [facility] cardiologist who thought it was because of his anemia, his hemoglobin was 9.  Ferritin was 28 in July.  He is not having dysphagia or abdominal pain.  He does "hear a dripping in his stomach like when he had polyp problems in 2021".  Other review of systems notable for undergoing foot debridements to avoid amputation, the heel foot infection question osteomyelitis.  This was done in [place].  He is quite pleased with that result.  Colonoscopy 01/03/20 - A tattoo was seen in the transverse colon. A post-polypectomy scar was found at the tattoo site. - The examination was otherwise normal on direct and retroflexion views. - No specimens collected. EGD 01/03/20 - Two gastric polyps. Resected and retrieved. Clips (MR conditional) were placed.Hyperplastic polyps - Blood in the duodenal bulb. - The examination was otherwise normal.  He does not have a reduced ejection fraction on echocardiogram and review of prior anesthesia notes shows no difficult intubation issues and he knows of no such problem.  He does not use oxygen.  Allergies  Allergen Reactions   Statins Other (See Comments)    Pt says they make him "mean"   Hydrocortisone Nausea Only   Ranexa [Ranolazine] Other (See Comments)    Severe weakness/near syncope after 1 dose Hallucinations    Hydrocodone Nausea And Vomiting   Miralax [Polyethylene Glycol] Nausea And Vomiting   Z-Pak [Azithromycin] Other (See Comments)    Raises  blood sugar   Zaroxolyn [Metolazone] Other (See Comments)    Drained, no energy   No outpatient medications have been marked as taking for the 01/12/22 encounter (Office Visit) with Gatha Mayer, MD.   Past Medical History:  Diagnosis Date   Adrenal insufficiency ([place])    Allergy    Anemia    Arthritis    feet    Broken foot 09/2019   had to wore a boot.  Left    CKD (chronic kidney disease), stage III (HCC)    Coronary artery disease    has stents   Coronary atherosclerosis of native coronary artery    Proximal LAD, posterior lateral stent widely patent-10/12/11   Diabetes mellitus    insulin and pills   Hearing loss    wears hearing aids   History of blood transfusion 06/30/2016   [facility] - 2 units transfused   Hyperlipidemia    Hypertension    OSA (obstructive sleep apnea)    uses VPAC sleep study 2 years done through [place]. Dr. Marlou Porch arranged study   PVD (peripheral vascular disease) ([place])    Sarcoidosis    Sleep apnea    uses CPAP nightly   Thyroid disease    Type 2 diabetes mellitus ([place])    Past Surgical History:  Procedure Laterality Date   APPENDECTOMY     CATARACT EXTRACTION  2011   bilat   CHOLECYSTECTOMY  01/25/2011   Procedure: LAPAROSCOPIC CHOLECYSTECTOMY WITH INTRAOPERATIVE CHOLANGIOGRAM;  Surgeon: Judieth Keens, DO;  Location: [place];  Service: General;  Laterality: N/A;   COLONOSCOPY     several   COLONOSCOPY WITH PROPOFOL N/A 01/03/2020   Procedure: COLONOSCOPY WITH PROPOFOL;  Surgeon: Gatha Mayer, MD;  Location: [facility];  Service: Endoscopy;  Laterality: N/A;   CORONARY ANGIOPLASTY     most recent 11/2009   CORONARY STENT INTERVENTION N/A 08/07/2018   Procedure: CORONARY STENT INTERVENTION;  Surgeon: Jettie Booze, MD;  Location: [place] [facility];  Service: Cardiovascular;  Laterality: N/A;   CORONARY STENT INTERVENTION N/A 01/18/2021   Procedure: CORONARY STENT INTERVENTION;  Surgeon: Sherren Mocha, MD;  Location: [place] [facility];  Service: Cardiovascular;  Laterality: N/A;   CORONARY STENT PLACEMENT  2009   in LAD and side branch PTCA   ESOPHAGOGASTRODUODENOSCOPY (EGD) WITH PROPOFOL N/A 01/03/2020   Procedure: ESOPHAGOGASTRODUODENOSCOPY (EGD) WITH PROPOFOL;  Surgeon: Gatha Mayer, MD;  Location: [facility];  Service: Endoscopy;  Laterality: N/A;   HEMOSTASIS CLIP  PLACEMENT  01/03/2020   Procedure: HEMOSTASIS CLIP PLACEMENT;  Surgeon: Gatha Mayer, MD;  Location: [facility];  Service: Endoscopy;;   INTERCOSTAL NERVE BLOCK  2011, 06/2016   x2. lumbar spine   INTRAVASCULAR PRESSURE WIRE/FFR STUDY N/A 01/18/2021   Procedure: INTRAVASCULAR PRESSURE WIRE/FFR STUDY;  Surgeon: Sherren Mocha, MD;  Location: [place] [facility];  Service: Cardiovascular;  Laterality: N/A;   LEFT HEART CATHETERIZATION WITH CORONARY ANGIOGRAM Bilateral 10/12/2011   Procedure: LEFT HEART CATHETERIZATION WITH CORONARY ANGIOGRAM;  Surgeon: Candee Furbish, MD;  Location: [facility] C[facility] LAB;  Service: Cardiovascular;  Laterality: Bilateral;   LEFT HEART CATHETERIZATION WITH CORONARY ANGIOGRAM N/A 04/01/2014   Procedure: LEFT HEART CATHETERIZATION WITH CORONARY ANGIOGRAM;  Surgeon: Candee Furbish, MD;  Location: [facility] CATH LAB;  Service: Cardiovascular;  Laterality: N/A;   POLYPECTOMY  01/03/2020   Procedure: POLYPECTOMY;  Surgeon: Gatha Mayer, MD;  [facility];  Service: Endoscopy;;   RIGHT/LEFT HEART CATH AND CORONARY ANGIOGRAPHY N/A 08/07/2018  Procedure: RIGHT/LEFT HEART CATH AND CORONARY ANGIOGRAPHY;  Surgeon: Jettie Booze, MD;  Location: [place] [facility];  Service: Cardiovascular;  Laterality: N/A;   RIGHT/LEFT HEART CATH AND CORONARY ANGIOGRAPHY N/A 12/25/2020   Procedure: RIGHT/LEFT HEART CATH AND CORONARY ANGIOGRAPHY;  Surgeon: Belva Crome, MD;  Location: [place] [facility];  Service: Cardiovascular;  Laterality: N/A;   TRANSFORAMINAL LUMBAR INTERBODY FUSION W/ MIS 1 LEVEL N/A 09/24/2021   Procedure: LUMBAR FOUR-FIVE MINIMALLY INVASIVE (MIS) TRANSFORAMINAL LUMBAR INTERBODY FUSION (TLIF) WITH METRX;  Surgeon: Judith Part, MD;  Location: [place];  Service: Neurosurgery;  Laterality: N/A;   Social History   Social History Narrative   Not on file   family history includes Anesthesia problems in his sister; Asthma in his sister; Diabetes in his mother; Heart  attack in his father; Kidney cancer in his mother; Kidney disease in his mother; Sarcoidosis in his niece and sister.   Review of Systems As above  Objective:   Physical Exam '@BP'$  114/60   Pulse 66   Ht '5\' 10"'$  (1.778 m)   Wt 223 lb (101.2 kg)   SpO2 94%   BMI 32.00 kg/m @  General:  Well-developed, well-nourished and in no acute distress - obese and chronically ill Eyes:  anicteric. ENT:   Mouth and posterior pharynx free of lesions.  Lungs: Clear to auscultation bilaterally. Heart:   S1S2, no rubs, murmurs, gallops. Abdomen:  Obese soft, non-tender, no hepatosplenomegaly, hernia, or mass and BS+. RLQ scar Rectal: deferred. Neuro:  A&O x 3.  Psych:  appropriate mood and  Affect.   Data Reviewed: See HPI  52 minutes total time spent on this patient's visit

## 2022-01-12 NOTE — Patient Instructions (Signed)
You have been scheduled for an endoscopy and colonoscopy. Please follow the written instructions given to you at your visit today. Please pick up your prep supplies at the pharmacy within the next 1-3 days. If you use inhalers (even only as needed), please bring them with you on the day of your procedure.   Your provider has requested that you go to the basement level for lab work before leaving today. Press "B" on the elevator. The lab is located at the first door on the left as you exit the elevator.   Due to recent changes in healthcare laws, you may see the results of your imaging and laboratory studies on MyChart before your provider has had a chance to review them.  We understand that in some cases there may be results that are confusing or concerning to you. Not all laboratory results come back in the same time frame and the provider may be waiting for multiple results in order to interpret others.  Please give [place] 48 hours in order for your provider to thoroughly review all the results before contacting the office for clarification of your results.    I appreciate the opportunity to care for you. Silvano Rusk, MD, [facility]

## 2022-01-13 ENCOUNTER — Other Ambulatory Visit (INDEPENDENT_AMBULATORY_CARE_PROVIDER_SITE_OTHER): Payer: Medicare HMO

## 2022-01-13 ENCOUNTER — Ambulatory Visit: Payer: Medicare HMO | Attending: Physician Assistant

## 2022-01-13 DIAGNOSIS — D508 Other iron deficiency anemias: Secondary | ICD-10-CM | POA: Diagnosis not present

## 2022-01-13 DIAGNOSIS — I493 Ventricular premature depolarization: Secondary | ICD-10-CM

## 2022-01-13 DIAGNOSIS — I739 Peripheral vascular disease, unspecified: Secondary | ICD-10-CM

## 2022-01-13 DIAGNOSIS — I5032 Chronic diastolic (congestive) heart failure: Secondary | ICD-10-CM

## 2022-01-13 DIAGNOSIS — I2510 Atherosclerotic heart disease of native coronary artery without angina pectoris: Secondary | ICD-10-CM

## 2022-01-13 DIAGNOSIS — R002 Palpitations: Secondary | ICD-10-CM

## 2022-01-13 DIAGNOSIS — G4733 Obstructive sleep apnea (adult) (pediatric): Secondary | ICD-10-CM

## 2022-01-13 DIAGNOSIS — R195 Other fecal abnormalities: Secondary | ICD-10-CM | POA: Diagnosis not present

## 2022-01-13 LAB — CBC WITH DIFFERENTIAL/PLATELET
Basophils Absolute: 0.1 10*3/uL (ref 0.0–0.1)
Basophils Relative: 1.3 % (ref 0.0–3.0)
Eosinophils Absolute: 0.2 10*3/uL (ref 0.0–0.7)
Eosinophils Relative: 2.3 % (ref 0.0–5.0)
HCT: 35.6 % — ABNORMAL LOW (ref 39.0–52.0)
Hemoglobin: 11.3 g/dL — ABNORMAL LOW (ref 13.0–17.0)
Lymphocytes Relative: 19.1 % (ref 12.0–46.0)
Lymphs Abs: 2 10*3/uL (ref 0.7–4.0)
MCHC: 31.7 g/dL (ref 30.0–36.0)
MCV: 86 fl (ref 78.0–100.0)
Monocytes Absolute: 1 10*3/uL (ref 0.1–1.0)
Monocytes Relative: 9.8 % (ref 3.0–12.0)
Neutro Abs: 7.1 10*3/uL (ref 1.4–7.7)
Neutrophils Relative %: 67.5 % (ref 43.0–77.0)
Platelets: 320 10*3/uL (ref 150.0–400.0)
RBC: 4.14 Mil/uL — ABNORMAL LOW (ref 4.22–5.81)
RDW: 17.8 % — ABNORMAL HIGH (ref 11.5–15.5)
WBC: 10.6 10*3/uL — ABNORMAL HIGH (ref 4.0–10.5)

## 2022-01-13 LAB — FERRITIN: Ferritin: 14.8 ng/mL — ABNORMAL LOW (ref 22.0–322.0)

## 2022-01-13 NOTE — Telephone Encounter (Signed)
Patient is following up, stating he is unable to print a label from the [place] home sleep study box? Would like a call back to discuss.

## 2022-01-13 NOTE — Procedures (Signed)
SLEEP STUDY REPORT Patient Information Study Date: 01/13/22 Patient Name: [name] Patient ID: [id number] Birth Date: 2046/02/18 Age: 76 Gender: Male BMI: 31.9 (W=222 lb, H=5' 10'') Stopbang: 6 Referring Physician: Nicholes Rough, PA  TEST DESCRIPTION: Home sleep apnea testing was completed using the WatchPat, a Type 1 device, utilizing peripheral arterial tonometry (PAT), chest movement, actigraphy, pulse oximetry, pulse rate, body position and snore.  AHI was calculated with apnea and hypopnea using valid sleep time as the denominator. RDI includes apneas, hypopneas, and RERAs.  The data acquired and the scoring of sleep and all associated events were performed in accordance with the recommended standards and specifications as outlined in the AASM Manual for the Scoring of Sleep and Associated Events 2.2.0 (2015).  FINDINGS:  1.  Severe Obstructive Sleep Apnea with AHI 60.1/hr.   2.  No significant Central Sleep Apnea with pAHIc 7.9/hr.  3.  Oxygen desaturations as low as 52%.  4.  Moderate snoring was present. O2 sats were < 88% for 60.6 min.  5.  Total sleep time was 5 hrs and 40 min.  6.  17.5% of total sleep time was spent in REM sleep.   7.  Normal sleep onset latency at 18 min.   8.  Shortened REM sleep onset latency at 53 min.   9.  Total awakenings were 17.  10. Arrhythmia detection: none.  DIAGNOSIS:   Severe Obstructive Sleep Apnea (G47.33) Nocturnal Hypoxemia  RECOMMENDATIONS:   1.  Clinical correlation of these findings is necessary.  The decision to treat obstructive sleep apnea (OSA) is usually based on the presence of apnea symptoms or the presence of associated medical conditions such as Hypertension, Congestive Heart Failure, Atrial Fibrillation or Obesity.  The most common symptoms of OSA are snoring, gasping for breath while sleeping, daytime sleepiness and fatigue.   2.  Initiating apnea therapy is recommended given the presence of symptoms and/or  associated conditions. Recommend proceeding with one of the following:     a.  Auto-CPAP therapy with a pressure range of 5-20cm H2O.     b.  An oral appliance (OA) that can be obtained from certain dentists with expertise in sleep medicine.  These are primarily of use in non-obese patients with mild and moderate disease.     c.  An ENT consultation which may be useful to look for specific causes of obstruction and possible treatment options.     d.  If patient is intolerant to PAP therapy, consider referral to ENT for evaluation for hypoglossal nerve stimulator.   3.  Close follow-up is necessary to ensure success with CPAP or oral appliance therapy for maximum benefit.  4.  A follow-up oximetry study on CPAP is recommended to assess the adequacy of therapy and determine the need for supplemental oxygen or the potential need for Bi-level therapy.  An arterial blood gas to determine the adequacy of baseline ventilation and oxygenation should also be considered.  5.  Healthy sleep recommendations include:  adequate nightly sleep (normal 7-9 hrs/night), avoidance of caffeine after noon and alcohol near bedtime, and maintaining a sleep environment that is cool, dark and quiet.  6.  Weight loss for overweight patients is recommended.  Even modest amounts of weight loss can significantly improve the severity of sleep apnea.  7.  Snoring recommendations include:  weight loss where appropriate, side sleeping, and avoidance of alcohol before bed.  8.  Operation of motor vehicle should be avoided when sleepy.  Signature:   Fransico Him, MD; [facility]; [place], [place] Board of Sleep Medicine Electronically Signed: 01/13/22

## 2022-01-13 NOTE — Telephone Encounter (Signed)
Pt has appt 02/03/22 with Nicholes Rough, PAC

## 2022-01-14 DIAGNOSIS — R051 Acute cough: Secondary | ICD-10-CM | POA: Diagnosis not present

## 2022-01-14 DIAGNOSIS — J40 Bronchitis, not specified as acute or chronic: Secondary | ICD-10-CM | POA: Diagnosis not present

## 2022-01-14 DIAGNOSIS — D509 Iron deficiency anemia, unspecified: Secondary | ICD-10-CM | POA: Diagnosis not present

## 2022-01-14 DIAGNOSIS — I10 Essential (primary) hypertension: Secondary | ICD-10-CM | POA: Diagnosis not present

## 2022-01-14 DIAGNOSIS — E1122 Type 2 diabetes mellitus with diabetic chronic kidney disease: Secondary | ICD-10-CM | POA: Diagnosis not present

## 2022-01-14 DIAGNOSIS — E7800 Pure hypercholesterolemia, unspecified: Secondary | ICD-10-CM | POA: Diagnosis not present

## 2022-01-14 DIAGNOSIS — E1142 Type 2 diabetes mellitus with diabetic polyneuropathy: Secondary | ICD-10-CM | POA: Diagnosis not present

## 2022-01-14 DIAGNOSIS — Z03818 Encounter for observation for suspected exposure to other biological agents ruled out: Secondary | ICD-10-CM | POA: Diagnosis not present

## 2022-01-14 DIAGNOSIS — E039 Hypothyroidism, unspecified: Secondary | ICD-10-CM | POA: Diagnosis not present

## 2022-01-17 NOTE — Telephone Encounter (Signed)
I tried to call the pt. Someone answered the phone though I kept saying hello and no responded. I was calling the pt to let him know he does not need to return the device. He is to follow the instructions that were given to him that he just disposes of the device when he is done. The sleep company obtained all the information through the phone.

## 2022-01-18 ENCOUNTER — Ambulatory Visit: Payer: Medicare HMO | Admitting: Internal Medicine

## 2022-01-18 ENCOUNTER — Telehealth: Payer: Self-pay | Admitting: *Deleted

## 2022-01-18 ENCOUNTER — Telehealth: Payer: Self-pay | Admitting: Cardiology

## 2022-01-18 ENCOUNTER — Other Ambulatory Visit: Payer: Self-pay

## 2022-01-18 DIAGNOSIS — I10 Essential (primary) hypertension: Secondary | ICD-10-CM

## 2022-01-18 DIAGNOSIS — G4733 Obstructive sleep apnea (adult) (pediatric): Secondary | ICD-10-CM

## 2022-01-18 DIAGNOSIS — I2510 Atherosclerotic heart disease of native coronary artery without angina pectoris: Secondary | ICD-10-CM

## 2022-01-18 MED ORDER — IRON (FERROUS SULFATE) 325 (65 FE) MG PO TABS: 325.0000 mg | ORAL_TABLET | Freq: Every day | ORAL | 3 refills | Status: DC

## 2022-01-18 MED ORDER — IRON (FERROUS SULFATE) 325 (65 FE) MG PO TABS: 325.0000 mg | ORAL_TABLET | Freq: Every day | ORAL | 0 refills | Status: DC

## 2022-01-18 NOTE — Telephone Encounter (Signed)
Pt calling to f/u on Zio Monitor results as well as Sleep results. Please advise

## 2022-01-18 NOTE — Telephone Encounter (Signed)
-----   Message from Lauralee Evener, [place] sent at 01/14/2022  8:57 AM EST -----  ----- Message ----- From: Sueanne Margarita, MD Sent: 01/13/2022   4:38 PM EST To: [facility] Sleep Studies  Please let patient know that they have sleep apnea.  Recommend therapeutic CPAP titration ASAP for treatment of patient's sleep disordered breathing.  If unable to perform an in lab titration then initiate ResMed auto CPAP from 4 to 15cm H2O with heated humidity and mask of choice and overnight pulse ox on CPAP.

## 2022-01-18 NOTE — Telephone Encounter (Signed)
The patient has been notified of the result and verbalized understanding.  All questions (if any) were answered. [name], [place] 01/18/2022 3:79 PM    Will precert titration

## 2022-01-18 NOTE — Telephone Encounter (Signed)
The patient has been notified of the result and verbalized understanding.  All questions (if any) were answered. Marolyn Hammock, [place] 01/18/2022 5:40 PM

## 2022-01-19 NOTE — Telephone Encounter (Signed)
See result note.  

## 2022-01-31 ENCOUNTER — Ambulatory Visit: Payer: Medicare HMO | Admitting: Nurse Practitioner

## 2022-01-31 ENCOUNTER — Telehealth: Payer: Self-pay | Admitting: Internal Medicine

## 2022-01-31 NOTE — Telephone Encounter (Signed)
He is on the books for [place] for 02/18/22 Sir.

## 2022-01-31 NOTE — Telephone Encounter (Signed)
Inbound call from patient stating he would prefer to have his procedure done in a hospital setting due to safety precautions he discusses with the provider. Please advise.

## 2022-02-02 NOTE — Progress Notes (Unsigned)
Office Visit    Patient Name: [name] Date of Encounter: 02/03/2022  PCP:  Shirline Frees, MD   [facility]  Cardiologist:  Candee Furbish, MD  Advanced Practice Provider:  No care team member to display Electrophysiologist:  None    Chief Complaint    [name] is a 76 y.o. male with a hx of CAD status post DES to the LAD and PDA August 2011, DES x2 to the R PDA June 2694, grade 1 diastolic dysfunction, pulmonary sarcoidosis, history of GI bleed, adrenal insufficiency on chronic steroids, hypertension, hyperlipidemia, PAD with left tibial disease treated medically, OSA, and CKD presents today for a 27-monthfollow-up appointment.  Past Medical History    Past Medical History:  Diagnosis Date   Adrenal insufficiency ([place]    Allergy    Anemia    Arthritis    feet    Broken foot 09/2019   had to wore a boot. Left    CKD (chronic kidney disease), stage III (HCC)    Coronary artery disease    has stents   Coronary atherosclerosis of native coronary artery    Proximal LAD, posterior lateral stent widely patent-10/12/11   Diabetes mellitus    insulin and pills   Hearing loss    wears hearing aids   History of blood transfusion 06/30/2016   [facility]- [facility] units transfused   Hyperlipidemia    Hypertension    OSA (obstructive sleep apnea)    uses [facility]ou Porcharranged study   PVD (peripheral vascular disease) ([facility]    Sarcoidosis    Sleep apnea    uses CPAP nightly   Thyroid disease    Type 2 diabetes mellitus ([place]    Past Surgical History:  Procedure Laterality Date   APPENDECTOMY     CATARACT EXTRACTION  2011   bilat   CHOLECYSTECTOMY  01/25/2011   Procedure: LAPAROSCOPIC CHOLECYSTECTOMY WITH INTRAOPERATIVE CHOLANGIOGRAM;  Surgeon: BJudieth Keens DO;  [place]  Service: General;  Laterality: N/A;   COLONOSCOPY     several   COLONOSCOPY WITH PROPOFOL N/A 01/03/2020   Procedure:  COLONOSCOPY WITH PROPOFOL;  Surgeon: GGatha Mayer MD;  [facility];  Service: Endoscopy;  Laterality: N/A;   CORONARY ANGIOPLASTY     most recent 11/2009   CORONARY STENT INTERVENTION N/A 08/07/2018   Procedure: CORONARY STENT INTERVENTION;  Surgeon: VJettie Booze MD;  [place];  Service: Cardiovascular;  Laterality: N/A;   CORONARY STENT INTERVENTION N/A 01/18/2021   Procedure: CORONARY STENT INTERVENTION;  Surgeon: CSherren Mocha MD;  [place];  Service: Cardiovascular;  Laterality: N/A;   CORONARY STENT PLACEMENT  2009   in LAD and side branch PTCA   ESOPHAGOGASTRODUODENOSCOPY (EGD) WITH PROPOFOL N/A 01/03/2020   Procedure: ESOPHAGOGASTRODUODENOSCOPY (EGD) WITH PROPOFOL;  Surgeon: GGatha Mayer MD;  [facility];  Service: Endoscopy;  Laterality: N/A;   HEMOSTASIS CLIP PLACEMENT  01/03/2020   Procedure: HEMOSTASIS CLIP PLACEMENT;  Surgeon: GGatha Mayer MD;  [facility];  Service: Endoscopy;;   INTERCOSTAL NERVE BLOCK  2011, 06/2016   x2. lumbar spine   INTRAVASCULAR PRESSURE WIRE/FFR STUDY N/A 01/18/2021   Procedure: INTRAVASCULAR PRESSURE WIRE/FFR STUDY;  Surgeon: CSherren Mocha MD;  [place] [facility];  Service: Cardiovascular;  Laterality: N/A;   LEFT HEART CATHETERIZATION WITH CORONARY ANGIOGRAM Bilateral 10/12/2011   Procedure:  LEFT HEART CATHETERIZATION WITH CORONARY ANGIOGRAM;  Surgeon: Candee Furbish, MD;  Location[facility] C[facility] LAB;  Service: Cardiovascular;  Laterality: Bilateral;   LEFT HEART CATHETERIZATION WITH CORONARY ANGIOGRAM N/A 04/01/2014   Procedure: LEFT HEART CATHETERIZATION WITH CORONARY ANGIOGRAM;  Surgeon: Candee Furbish, MD;  Location: [facility] C[facility] LAB;  Service: Cardiovascular;  Laterality: N/A;   POLYPECTOMY  01/03/2020   Procedure: POLYPECTOMY;  Surgeon: Gatha Mayer, MD;  [facility];  Service: Endoscopy;;   RIGHT/LEFT HEART CATH AND CORONARY ANGIOGRAPHY N/A 08/07/2018    Procedure: RIGHT/LEFT HEART CATH AND CORONARY ANGIOGRAPHY;  Surgeon: Jettie Booze, MD;  Location: [place] [facility];  Service: Cardiovascular;  Laterality: N/A;   RIGHT/LEFT HEART CATH AND CORONARY ANGIOGRAPHY N/A 12/25/2020   Procedure: RIGHT/LEFT HEART CATH AND CORONARY ANGIOGRAPHY;  Surgeon: Belva Crome, MD;  Location: [place] [facility];  Service: Cardiovascular;  Laterality: N/A;   TRANSFORAMINAL LUMBAR INTERBODY FUSION W/ MIS 1 LEVEL N/A 09/24/2021   Procedure: LUMBAR FOUR-FIVE MINIMALLY INVASIVE (MIS) TRANSFORAMINAL LUMBAR INTERBODY FUSION (TLIF) WITH METRX;  Surgeon: Judith Part, MD;  Location: [place];  Service: Neurosurgery;  Laterality: N/A;    Allergies  Allergies  Allergen Reactions   Statins Other (See Comments)    Pt says they make him "mean"   Hydrocortisone Nausea Only   Ranexa [Ranolazine] Other (See Comments)    Severe weakness/near syncope after 1 dose Hallucinations    Hydrocodone Nausea And Vomiting   Miralax [Polyethylene Glycol] Nausea And Vomiting   Z-Pak [Azithromycin] Other (See Comments)    Raises blood sugar   Zaroxolyn [Metolazone] Other (See Comments)    Drained, no energy    History of Present Illness    [name] is a 76 y.o. male with a hx of CAD status post DES to the LAD and PDA August 2011, DES x2 to the R PDA June 2992, grade 1 diastolic dysfunction, pulmonary sarcoidosis, history of GI bleed, adrenal insufficiency on chronic steroids, hypertension, hyperlipidemia, PAD with left tibial disease treated medically, OSA, and CKD presents today for a hospital follow-up appointment.   He was seen in the office on 10/2019 with [name] with complaints of dyspnea on exertion and chest pain but was also noted to be Hemoccult positive and undergoing GI work-up. He was admitted with a GI bleed 12/2019 and required transfusion. Endoscopy showed small amount of blood in the duodenal bulb and no active bleeding with 2 small polyps removed.  Colonoscopy with no active bleeding. He was resumed on his aspirin/Plavix.   Later had 2 surgeries on his left foot [facility] for Charcot's foot and osteomyelitis.  Was last seen in the office on 12/2020 with Dr. Marlou Porch and complained of chest pain and dyspnea on exertion for 2 months prior to visit.  He was sent for outpatient cardiac catheterization which noted diffuse three-vessel CAD with moderate LAD disease beyond stented segment, moderate severe diagonal disease, 70% ramus and new 75 to 80% proximal (RFR 0.90)  and 60% mid distal RCA lesion (RFR 0.91).  Recommendations were conservative management with medical therapy along with initiation of SGLT2 therapy.  Notes indicate RCA could be treated although no physiologic evidence to support doing so the day of cath. Patient called back to the office with recurrent chest pain and spoke with Dr. Marlou Porch regarding plan and he was set up for relook outpatient cardiac cath.   Patient underwent cardiac catheterization and subsequently underwent successful two-vessel PCI guided FFR/R [place] with stenting of  severe stenosis of the proximal to mid RCA along with severe stenosis of the first diagonal branch of the LAD.  DES x2 to RCA along with DES x1 to diagonal branch.  Plan for DAPT with ASA/Plavix for at least 6 months but likely longer if patient tolerates well.  We are seeing him today for follow-up after stent placement.  He was seen by me last year 01/26/2021 and he is having some positional dizziness which is also notices during activity. His breathing is better.  He is having some dizziness with activity and positional dizziness.  His blood pressure at home is running 542H to 062B systolic.  He does notice occasionally that his heart beats a little faster during these episodes.  He told me that he had an arrhythmia at the time of his last stent placement but has never worn a monitor.  He has no chest pain and overall feels well and is walking further than he  ever has.  He has been breathing much better ever since his stent placement.  His activity has been somewhat limited due to his foot surgery and walking in a boot.  He stated that he has been dealing with his foot issue for over a year.  His appetite has been picking up and he has been eating a lot of salad and toast with cheese.  He was given a heart healthy diet education packet before he left the hospital.  He plans to do cardiac rehab however [facility] has a wait list of 3 months.  He expressed interest in doing his rehab over at [facility].  He states he has done his rehab over there in the past for his heart.  Our office is working on sending a referral there.  We will go ahead and order a fasting lipid panel as well as LFTs today since he has not had those labs in 2 years.  He has been taking his Crestor and tolerating it.  He will continue on dual antiplatelet therapy for at least 6 months.  He was seen by me 10/24 and he stated that he did not have a CPAP machine and has been off of it for 2 months.  He feels like his shortness of breath is worse due to this.  He also has been taking Lasix 80 mg once a day for his lower extremity edema.  This has been helping.  He states that he had back surgery and still dealing with back spasms but has done some physical therapy on his own.  Patient and his wife are worried about a monitor that he will back in August but they never heard the results from.  No results available in chart.  Will order another ZIO x3 days for PVCs.  Patient also states he is on iron for history of anemia.  We will order some labs to check on this today.  He also endorses shortness of breath for the last several months.  I believe this to be multifactorial.  He had several bouts of pneumonia as well as bronchitis was in the hospital.  He also states that from his back surgery he has been less active as of late so there surely is a component of exercise intolerance involved.  He  also has a history of pulm sarcoidosis.  Recent echocardiogram reviewed and has been stable.  His ischemic equivalent is chest pain and shortness of breath.  He denies chest pain.  Today, he states he has been  working since aug 2023 to get a CPAP. Diagnosed with severe sleep apnea and recent monitor with frequent PVCs. We discussed his lower ext edema and he deferred an escalation in his diuretics for now. We discussed his recent monitor results and his need for CPAP. He and his wife are very frustrated. Spoke with Gae Bon and I guess CPAP is going through preauth with his insurance which can take several weeks. Otherwise, no chest pains or new SOB.  Reports no chest pain, pressure, or tightness. Reports no palpitations.    EKGs/Labs/Other Studies Reviewed:   The following studies were reviewed today:  Echocardiogram 10/21/2021  IMPRESSIONS     1. Limited echo for LV function   2. Left ventricular ejection fraction, by estimation, is 55 to 60%. The  left ventricle has normal function. The left ventricle has no regional  wall motion abnormalities.   3. The inferior vena cava is dilated in size with >50% respiratory  variability, suggesting right atrial pressure of 8 mmHg.   Comparison(s): No significant change from prior study. 05/13/2021: LVEF  55-60%.   FINDINGS   Left Ventricle: Left ventricular ejection fraction, by estimation, is 55  to 60%. The left ventricle has normal function. The left ventricle has no  regional wall motion abnormalities.   Pericardium: Trivial pericardial effusion is present. The pericardial  effusion is posterior to the left ventricle.   Venous: The inferior vena cava is dilated in size with greater than 50%  respiratory variability, suggesting right atrial pressure of 8 mmHg.   Lyman Bishop MD  Electronically signed by Lyman Bishop MD  Signature Date/Time: 10/21/2021/10:43:08 AM   Cardic Cath with PCI 01/18/21    Prox LAD-1 lesion is 40% stenosed.    Prox LAD-2 lesion is 65% stenosed.   Mid Cx lesion is 50% stenosed.   Mid RCA lesion is 60% stenosed.   Prox RCA lesion is 75% stenosed.   Ost Ramus to Ramus lesion is 70% stenosed.   Ost 1st Diag lesion is 80% stenosed.   Non-stenotic Ost RPDA lesion was previously treated.   Non-stenotic Ost RPDA to RPDA lesion was previously treated.   Non-stenotic RPAV lesion was previously treated.   A drug-eluting stent was successfully placed using a STENT ONYX FRONTIER 3.5X15.   A drug-eluting stent was successfully placed using a STENT ONYX FRONTIER 3.5X18.   A drug-eluting stent was successfully placed using a STENT ONYX FRONTIER 2.5X18.   Post intervention, there is a 0% residual stenosis.   Post intervention, there is a 0% residual stenosis.   Post intervention, there is a 0% residual stenosis.   Successful two-vessel PCI guided by RFR/FFR with stenting of severe stenoses in the proximal and mid RCA and severe stenosis in the first diagonal branch of the LAD.  All lesions treated with Onyx frontier drug-eluting stents (total of 3 stents implanted).  Patient should be treated with dual antiplatelet therapy with aspirin and clopidogrel for minimum of 6 months, favor long-term therapy if well-tolerated considering his multivessel stenting.  Same-day PCI protocol.    07/27/2018 Echocardiogram   1. The left ventricle has normal systolic function with an ejection  fraction of 60-65%. The cavity size was normal. There is mildly increased  left ventricular wall thickness. Left ventricular diastolic Doppler  parameters are consistent with impaired  relaxation.   2. The right ventricle has normal systolic function. The cavity was  normal. There is no increase in right ventricular wall thickness.   3. The aortic valve  is tricuspid. Mild thickening of the aortic valve.  Mild calcification of the aortic valve.   FINDINGS   Left Ventricle: The left ventricle has normal systolic function, with an   ejection fraction of 60-65%. The cavity size was normal. There is mildly  increased left ventricular wall thickness. Left ventricular diastolic  Doppler parameters are consistent  with impaired relaxation. Normal left ventricular filling pressures   Right Ventricle: The right ventricle has normal systolic function. The  cavity was normal. There is no increase in right ventricular wall  thickness.   Left Atrium: Left atrial size was normal in size.   Right Atrium: Right atrial size was normal in size. Right atrial pressure  is estimated at 10 mmHg.   Interatrial Septum: No atrial level shunt detected by color flow Doppler.   Pericardium: There is no evidence of pericardial effusion. There is a  pericardial fat pad noted.   Mitral Valve: The mitral valve is normal in structure. Mitral valve  regurgitation is not visualized by color flow Doppler.   Tricuspid Valve: The tricuspid valve is normal in structure. Tricuspid  valve regurgitation is trivial by color flow Doppler.   Aortic Valve: The aortic valve is tricuspid Mild thickening of the aortic  valve. Mild calcification of the aortic valve. Aortic valve regurgitation  was not visualized by color flow Doppler. There is no evidence of aortic  valve stenosis.   Pulmonic Valve: The pulmonic valve was not well visualized. Pulmonic valve  regurgitation is not visualized by color flow Doppler.   Venous: The inferior vena cava measures 1.10 cm, is normal in size with  greater than 50% respiratory variability.   EKG:  EKG was ordered in the hospital on 01/19/2021 and showed normal sinus rhythm with an old right bundle branch block.  Recent Labs: 09/14/2021: TSH 0.829 10/19/2021: ALT 18; B Natriuretic Peptide 118.7 10/20/2021: Magnesium 1.9 12/28/2021: BUN 19; Creatinine, Ser 1.17; Potassium 4.1; Sodium 142 01/13/2022: Hemoglobin 11.3; Platelets 320.0  Recent Lipid Panel    Component Value Date/Time   CHOL 134 01/26/2021 1012    TRIG 173 (H) 01/26/2021 1012   HDL 28 (L) 01/26/2021 1012   CHOLHDL 4.8 01/26/2021 1012   CHOLHDL 11.4 08/15/2008 0408   VLDL 41 (H) 08/15/2008 0408   LDLCALC 76 01/26/2021 1012    Home Medications   Current Meds  Medication Sig   acetaminophen (TYLENOL) 500 MG tablet Take 1,000 mg by mouth daily as needed for mild pain (pain).   albuterol (PROVENTIL) (2.5 MG/3ML) 0.083% nebulizer solution Take 3 mLs (2.5 mg total) by nebulization every 6 (six) hours as needed for wheezing or shortness of breath. Dx Code D86.0   Albuterol Sulfate (PROAIR RESPICLICK) 209 (90 BASE) MCG/ACT AEPB Inhale 2 puffs into the lungs every 6 (six) hours as needed. (Patient taking differently: Inhale 2 puffs into the lungs as needed (for shortness of breath).)   aspirin EC 81 MG tablet Take 81 mg by mouth every evening.    benzonatate (TESSALON) 200 MG capsule Take 200 mg by mouth as needed for cough.   Cholecalciferol (VITAMIN D3) 3000 UNITS TABS Take 3,000 Units by mouth daily.   clopidogrel (PLAVIX) 75 MG tablet Take 1 tablet (75 mg total) by mouth daily. Restart on 09/28/21   Coenzyme Q10 (COQ-10) 400 MG CAPS Take 400 mg by mouth 2 (two) times a week. Sunday, Wednesday   ezetimibe (ZETIA) 10 MG tablet Take 10 mg by mouth once a week.   furosemide (LASIX) 80  MG tablet Take 1 tablet (80 mg total) by mouth daily as needed for edema (take Lasix 80 mg as needed if gains more than 3 pounds in 1 day or 5 pounds in 1 week). (Patient taking differently: Take 80 mg by mouth daily.)   hydrALAZINE (APRESOLINE) 10 MG tablet Take 1 tablet (10 mg total) by mouth every 8 (eight) hours.   insulin aspart protamine- aspart (NOVOLOG MIX 70/30) (70-30) 100 UNIT/ML injection Inject 0.4 mLs (40 Units total) into the skin with breakfast, with lunch, and with evening meal. (Patient taking differently: Inject 25-35 Units into the skin with breakfast, with lunch, and with evening meal.)   Iron, Ferrous Sulfate, 325 (65 Fe) MG TABS Take 325 mg by  mouth daily.   levothyroxine (SYNTHROID) 75 MCG tablet Take 75 mcg by mouth daily.   metoprolol tartrate (LOPRESSOR) 25 MG tablet Take 0.5 tablets (12.5 mg total) by mouth 2 (two) times daily.   montelukast (SINGULAIR) 10 MG tablet Take 10 mg by mouth daily as needed (allergies).    Multiple Vitamin (MULTIVITAMIN WITH MINERALS) TABS tablet Take 1 tablet by mouth daily.   OVER THE COUNTER MEDICATION Take 1 tablet by mouth daily. Omega XL   oxyCODONE (OXY IR/ROXICODONE) 5 MG immediate release tablet Take 1 tablet (5 mg total) by mouth every 4 (four) hours as needed for severe pain or breakthrough pain.   potassium chloride (KLOR-CON M) 10 MEQ tablet Take 10 mEq by mouth daily.   predniSONE (DELTASONE) 10 MG tablet Take PO 4 tabs daily x 2 days,3 tabs daily x 2 days,2 tabs daily x 2 days,then resume home dose 1 tab daily   RABEprazole (ACIPHEX) 20 MG tablet Take 20 mg by mouth daily.   rosuvastatin (CRESTOR) 20 MG tablet Take 20 mg by mouth daily.       Physical Exam    VS:  BP (!) 130/58   Pulse 86   Ht '5\' 10"'$  (1.778 m)   Wt 223 lb (101.2 kg)   SpO2 96%   BMI 32.00 kg/m  , BMI Body mass index is 32 kg/m.  Wt Readings from Last 3 Encounters:  02/03/22 223 lb (101.2 kg)  01/12/22 223 lb (101.2 kg)  12/28/21 222 lb 12.8 oz (101.1 kg)     GEN: Well nourished, well developed, in no acute distress. HEENT: normal. Neck: Supple, no JVD, carotid bruits, or masses. Cardiac: RRR, no murmurs, rubs, or gallops. No clubbing, cyanosis, 1-2 + pitting bilateral edema.  Radials/PT 2+ and equal bilaterally.  Respiratory:  Respirations regular and unlabored, clear to auscultation bilaterally. GI: Soft, nontender, nondistended. MS: No deformity or atrophy. Skin: Warm and dry, no rash. Neuro:  Strength and sensation are intact. Psych: Normal affect.  Assessment & Plan    Severe sleep apnea with PVC burden -reviewed most recent sleep study, CPAP recommended.  -CPAP going through preauth with  his insurance -discussed with Gae Bon today  Multifactorial shortness of breath/chronic diastolic heart failure-He has been taking 80 of Lasix daily due to lower extremity edema.  This does help.  He has had a few episodes of pneumonia and bronchitis.  He also had recent back surgery where his activity level was severely limited.  There might be a factor of exercise intolerance involved as well.  We will order a BMP due to being on higher dose of Lasix to check his kidneys.  He also has been anemic and has needed iron transfusions.  Most recent echocardiogram reviewed with the patient.  When he had his stents placed he had both chest pain and shortness of breath and this is his ischemic equivalent.  CT scan negative for PE. -continue current dose of lasix, offer metolazone for a few days but patient did not like what it did to his kidneys the last time.  CAD status post successful two-vessel PCI with DES x2 to RCA along with DES x1 to diagonal branch-No recurrent chest pain. Continue with DAPT, ASA/Plavix for at least 6 months but likely longer if the patient tolerates. (He has been on it for a year) GDMT: statin, asa/plavix.  Last stent placement November 2022.  Remains on DAPT.    Palpitations and positional dizziness-zio monitor ordered and showed rare PACs and frequent PVCs (8.3%). Start metoprolol tartrate 12.'5mg'$  BID. Documented BB intolerance form 2016, but unspecified and patient cannot remember.   Hypertension-well-controlled on current medical regiment.  Continue Norvasc and Cozaar.  Continue to check blood pressure daily at home.  Blood pressures at home have been running 935T-017B systolic.   Hyperlipidemia-continue Crestor.  Last lipid panel from 02/2021 revealed LDL of 33, total cholesterol 105, triglycerides 173.  Continue Crestor 20 mg daily and Zetia 10 mg daily.  History of GI bleed-Last year when he was evaluated for stents, found to be anemic, two bleeding polyps and one was taken out.  Two iron infusions. No bloody stools or urine. Update CBC.  Follow-up planned with GI November 8th.   PAD-continue current medical treatment  Obesity-reviewed low-sodium, heart healthy diet.  Continue daily exercise with a goal of 30 minutes x 5 days a week.   Disposition: Follow up 3 months with Candee Furbish, MD or APP.  Signed, Elgie Collard, PA-C 02/03/2022, 12:48 PM [facility]

## 2022-02-03 ENCOUNTER — Ambulatory Visit: Payer: Medicare HMO | Attending: Physician Assistant | Admitting: Physician Assistant

## 2022-02-03 ENCOUNTER — Telehealth: Payer: Self-pay | Admitting: *Deleted

## 2022-02-03 ENCOUNTER — Encounter: Payer: Self-pay | Admitting: Physician Assistant

## 2022-02-03 VITALS — BP 130/58 | HR 86 | Ht 70.0 in | Wt 223.0 lb

## 2022-02-03 DIAGNOSIS — I10 Essential (primary) hypertension: Secondary | ICD-10-CM

## 2022-02-03 DIAGNOSIS — K264 Chronic or unspecified duodenal ulcer with hemorrhage: Secondary | ICD-10-CM

## 2022-02-03 DIAGNOSIS — E785 Hyperlipidemia, unspecified: Secondary | ICD-10-CM

## 2022-02-03 DIAGNOSIS — G4733 Obstructive sleep apnea (adult) (pediatric): Secondary | ICD-10-CM | POA: Diagnosis not present

## 2022-02-03 DIAGNOSIS — I493 Ventricular premature depolarization: Secondary | ICD-10-CM

## 2022-02-03 DIAGNOSIS — I5032 Chronic diastolic (congestive) heart failure: Secondary | ICD-10-CM

## 2022-02-03 DIAGNOSIS — I739 Peripheral vascular disease, unspecified: Secondary | ICD-10-CM | POA: Diagnosis not present

## 2022-02-03 DIAGNOSIS — R002 Palpitations: Secondary | ICD-10-CM | POA: Diagnosis not present

## 2022-02-03 DIAGNOSIS — I2510 Atherosclerotic heart disease of native coronary artery without angina pectoris: Secondary | ICD-10-CM

## 2022-02-03 MED ORDER — METOPROLOL TARTRATE 25 MG PO TABS: 12.5000 mg | ORAL_TABLET | Freq: Two times a day (BID) | ORAL | 3 refills | Status: DC

## 2022-02-03 NOTE — Patient Instructions (Signed)
Medication Instructions:  1.Start metoprolol tartrate 12.5 mg twice a day, this will be 1/2 of a 25 mg tablet twice daily *If you need a refill on your cardiac medications before your next appointment, please call your pharmacy*   Lab Work: None If you have labs (blood work) drawn today and your tests are completely normal, you will receive your results only by: [place] (if you have MyChart) OR A paper copy in the mail If you have any lab test that is abnormal or we need to change your treatment, we will call you to review the results.   Follow-Up: At [facility], you and your health needs are our priority.  As part of our continuing mission to provide you with exceptional heart care, we have created designated Provider Care Teams.  These Care Teams include your primary Cardiologist (physician) and Advanced Practice Providers (APPs -  Physician Assistants and Nurse Practitioners) who all work together to provide you with the care you need, when you need it.  We recommend signing up for the patient portal called "MyChart".  Sign up information is provided on this After Visit Summary.  MyChart is used to connect with patients for Virtual Visits (Telemedicine).  Patients are able to view lab/test results, encounter notes, upcoming appointments, etc.  Non-urgent messages can be sent to your provider as well.   To learn more about what you can do with MyChart, go to [email or web address].    Your next appointment:   3 month(s)  The format for your next appointment:   In Person  Provider:   Candee Furbish, MD    Important Information About Sugar

## 2022-02-03 NOTE — Telephone Encounter (Signed)
Patient states the insurance company says they never received an order for the CPAP.

## 2022-02-03 NOTE — Telephone Encounter (Signed)
[name] prefers to be done at the hospital. He has been added to the hospital list.

## 2022-02-03 NOTE — Telephone Encounter (Signed)
If we do that it will be January or February before I can do the procedure.  That said he is an appropriate candidate for the [place] to the best of my knowledge if he wishes to continue with that.  If he wants to be done at the hospital I will put him on the waiting list and we will sorted out.  He should be aware that that will be more expensive and his insurance company might not cover it as well.

## 2022-02-04 ENCOUNTER — Other Ambulatory Visit: Payer: Self-pay | Admitting: Physician Assistant

## 2022-02-04 DIAGNOSIS — J189 Pneumonia, unspecified organism: Secondary | ICD-10-CM | POA: Diagnosis not present

## 2022-02-04 DIAGNOSIS — R051 Acute cough: Secondary | ICD-10-CM | POA: Diagnosis not present

## 2022-02-08 ENCOUNTER — Other Ambulatory Visit: Payer: Self-pay

## 2022-02-08 NOTE — Telephone Encounter (Signed)
Pt is calling to get update on CPAP machine orders. Requesting return call.

## 2022-02-09 ENCOUNTER — Telehealth: Payer: Self-pay | Admitting: Pharmacy Technician

## 2022-02-09 NOTE — Telephone Encounter (Addendum)
Auth SubmissioN: APPROVED Payer: [employer] Medication & CPT/J Code(s) submitted: MONOFERRIC - [id number] Route of submission (phone, fax, portal):  Phone # Fax # Auth type: Buy/Bill Units/visits requested: x1 Reference number: [id number] Approval from: 02/6522 to 03/07/23  Shirlean Kelly has been denied.  Awaiting new referral for monoferric. Spoke w/Trey RN @ [facility]

## 2022-02-15 ENCOUNTER — Other Ambulatory Visit: Payer: Self-pay

## 2022-02-15 NOTE — Telephone Encounter (Signed)
Patients cpap titration is scheduled for 03/13/22. This test will get the setting for his cpap.

## 2022-02-18 ENCOUNTER — Encounter: Payer: Medicare HMO | Admitting: Internal Medicine

## 2022-02-18 ENCOUNTER — Ambulatory Visit (HOSPITAL_BASED_OUTPATIENT_CLINIC_OR_DEPARTMENT_OTHER): Payer: Medicare HMO | Attending: Cardiology | Admitting: Cardiology

## 2022-02-18 VITALS — Ht 70.0 in | Wt 220.0 lb

## 2022-02-18 DIAGNOSIS — G4733 Obstructive sleep apnea (adult) (pediatric): Secondary | ICD-10-CM | POA: Diagnosis not present

## 2022-02-18 DIAGNOSIS — I493 Ventricular premature depolarization: Secondary | ICD-10-CM | POA: Diagnosis not present

## 2022-02-18 DIAGNOSIS — I10 Essential (primary) hypertension: Secondary | ICD-10-CM | POA: Insufficient documentation

## 2022-02-18 DIAGNOSIS — I2510 Atherosclerotic heart disease of native coronary artery without angina pectoris: Secondary | ICD-10-CM | POA: Insufficient documentation

## 2022-02-21 ENCOUNTER — Telehealth: Payer: Self-pay | Admitting: Cardiology

## 2022-02-21 NOTE — Telephone Encounter (Signed)
Patient states he had his sleep study on Friday, 12/15. He is hopeful to have CPAP equipment ordered before the end of the year if possible because he states he met his deductible.

## 2022-02-21 NOTE — Procedures (Signed)
   Patient Name: [name], [name] Date: 02/18/2022 Gender: Male D.O.B: 1945/06/12 Age (years): 33 Referring Provider: Fransico Him MD, ABSM Height (inches): 70 Interpreting Physician: Fransico Him MD, ABSM Weight (lbs): 220 RPSGT: Jorge Ny BMI: 32 MRN: [id number] Neck Size: 18.00  CLINICAL INFORMATION The patient is referred for a CPAP titration to treat sleep apnea.  SLEEP STUDY TECHNIQUE As per the AASM Manual for the Scoring of Sleep and Associated Events v2.3 (April 2016) with a hypopnea requiring 4% desaturations.  The channels recorded and monitored were frontal, central and occipital EEG, electrooculogram (EOG), submentalis EMG (chin), nasal and oral airflow, thoracic and abdominal wall motion, anterior tibialis EMG, snore microphone, electrocardiogram, and pulse oximetry. Continuous positive airway pressure (CPAP) was initiated at the beginning of the study and titrated to treat sleep-disordered breathing.  MEDICATIONS Medications self-administered by patient taken the night of the study : N/A  TECHNICIAN COMMENTS Comments added by technician: Patient had difficulty initiating sleep. Comments added by scorer: N/A  RESPIRATORY PARAMETERS Optimal PAP Pressure (cm): N/A  AHI at Optimal Pressure (/hr):N/A Overall Minimal O2 (%):77.0  Supine % at Optimal Pressure (%):N/A Minimal O2 at Optimal Pressure (%): 77.0   SLEEP ARCHITECTURE The study was initiated at 10:18:20 PM and ended at 4:57:36 AM.  Sleep onset time was 6.5 minutes and the sleep efficiency was 47.7%. The total sleep time was 190.5 minutes.  The patient spent 2.9% of the night in stage N1 sleep, 59.6% in stage N2 sleep, 0.0% in stage N3 and 37.5% in REM.Stage REM latency was 14.5 minutes  Wake after sleep onset was 202.3. Alpha intrusion was absent. Supine sleep was 100.00%.  CARDIAC DATA The 2 lead EKG demonstrated sinus rhythm. The mean heart rate was 57.4 beats per minute. Other EKG findings  include: PVCs.  LEG MOVEMENT DATA The total Periodic Limb Movements of Sleep (PLMS) were 0. The PLMS index was 0.0. A PLMS index of <15 is considered normal in adults.  IMPRESSIONS - An optimal PAP pressure could not be selected for this patient based on the available study data.  Patient had Pneumonia for 1 week and had a lot of coughing during the study - Central sleep apnea was not noted during this titration (CAI = 3.5/h). - Severe oxygen desaturations were observed during this titration (min O2 = 77.0%). - The patient snored with soft snoring volume during this titration study. - 2-lead EKG demonstrated: PVCs - Clinically significant periodic limb movements were not noted during this study. Arousals associated with PLMs were rare.  DIAGNOSIS - Obstructive Sleep Apnea (G47.33)  RECOMMENDATIONS - Recommend repeat CPAP titration once respiratory illness completely resolved.  May need BiPAP.  - Avoid alcohol, sedatives and other CNS depressants that may worsen sleep apnea and disrupt normal sleep architecture. - Sleep hygiene should be reviewed to assess factors that may improve sleep quality. - Weight management and regular exercise should be initiated or continued.  [Electronically signed] 02/21/2022 07:02 PM  Fransico Him MD, ABSM Diplomate, American Board of Sleep Medicine

## 2022-02-22 ENCOUNTER — Ambulatory Visit (INDEPENDENT_AMBULATORY_CARE_PROVIDER_SITE_OTHER): Payer: Medicare HMO

## 2022-02-22 VITALS — BP 151/74 | HR 52 | Temp 97.5°F | Resp 18

## 2022-02-22 DIAGNOSIS — K921 Melena: Secondary | ICD-10-CM | POA: Diagnosis not present

## 2022-02-22 DIAGNOSIS — D500 Iron deficiency anemia secondary to blood loss (chronic): Secondary | ICD-10-CM

## 2022-02-22 DIAGNOSIS — D508 Other iron deficiency anemias: Secondary | ICD-10-CM

## 2022-02-22 MED ORDER — DIPHENHYDRAMINE HCL 50 MG/ML IJ SOLN
25.0000 mg | Freq: Once | INTRAMUSCULAR | Status: AC
  Administered 2022-02-22: 25 mg via INTRAVENOUS
  Filled 2022-02-22: qty 1

## 2022-02-22 MED ORDER — SODIUM CHLORIDE 0.9 % IV SOLN
1000.0000 mg | Freq: Once | INTRAVENOUS | Status: AC
  Administered 2022-02-22: 1000 mg via INTRAVENOUS
  Filled 2022-02-22: qty 10

## 2022-02-22 MED ORDER — HYDROCORTISONE SOD SUC (PF) 100 MG IJ SOLR: 100.0000 mg | Freq: Once | INTRAMUSCULAR | Status: DC

## 2022-02-22 MED ORDER — ACETAMINOPHEN 325 MG PO TABS
650.0000 mg | ORAL_TABLET | Freq: Once | ORAL | Status: AC
  Administered 2022-02-22: 650 mg via ORAL
  Filled 2022-02-22: qty 2

## 2022-02-22 NOTE — Progress Notes (Signed)
Diagnosis: Iron Deficiency Anemia  Provider:  Marshell Garfinkel MD  Procedure: Infusion  IV Type: Peripheral, IV Location: R Antecubital  Monoferric (Ferric Derisomaltose), Dose: 1000 mg  Infusion Start Time: 0981  Infusion Stop Time: 0935 am  Post Infusion IV Care: Observation period completed and Peripheral IV Discontinued  Discharge: Condition: Good, Destination: Home . AVS provided to patient.   Performed by:  Adelina Mings, LPN

## 2022-02-23 NOTE — Telephone Encounter (Signed)
Patient is calling about his CPAP machine, he would like to get it ordered before the end of the year since his has met his deductible for the year.  

## 2022-03-01 ENCOUNTER — Telehealth: Payer: Self-pay | Admitting: *Deleted

## 2022-03-01 DIAGNOSIS — G4733 Obstructive sleep apnea (adult) (pediatric): Secondary | ICD-10-CM

## 2022-03-01 DIAGNOSIS — I2510 Atherosclerotic heart disease of native coronary artery without angina pectoris: Secondary | ICD-10-CM

## 2022-03-01 DIAGNOSIS — I10 Essential (primary) hypertension: Secondary | ICD-10-CM

## 2022-03-01 NOTE — Telephone Encounter (Signed)
The patient has been notified of the result and verbalized understanding.  All questions (if any) were answered. [name], [place] 03/01/2022 4:29 PM    Patient understand that per Dr Radford Pax, Recommend repeat CPAP titration once respiratory illness completely resolved. May need BiPAP.

## 2022-03-01 NOTE — Telephone Encounter (Signed)
The patient has been notified of the result and verbalized understanding.  All questions (if any) were answered. Marolyn Hammock, [place] 03/01/2022 4:26 PM    Pt is aware and agreeable to cpap/Bipap titration

## 2022-03-01 NOTE — Telephone Encounter (Signed)
-----   Message from Lauralee Evener, [place] sent at 02/22/2022  8:11 AM EST -----  ----- Message ----- From: Sueanne Margarita, MD Sent: 02/21/2022   7:05 PM EST To: Cv Div Sleep Studies  Unsuccessful CPAP titration because patient had Pneumonia that week prior and had a lot of coughing during the study and could not be titrated. Please wait until early January to repeat CPAP titration once he is completely recovered

## 2022-03-10 DIAGNOSIS — D860 Sarcoidosis of lung: Secondary | ICD-10-CM | POA: Diagnosis not present

## 2022-03-10 DIAGNOSIS — J069 Acute upper respiratory infection, unspecified: Secondary | ICD-10-CM | POA: Diagnosis not present

## 2022-03-13 ENCOUNTER — Encounter (HOSPITAL_BASED_OUTPATIENT_CLINIC_OR_DEPARTMENT_OTHER): Payer: Medicare HMO | Admitting: Cardiology

## 2022-03-15 ENCOUNTER — Emergency Department (HOSPITAL_COMMUNITY): Payer: Medicare HMO

## 2022-03-15 ENCOUNTER — Encounter (HOSPITAL_COMMUNITY): Payer: Self-pay

## 2022-03-15 ENCOUNTER — Inpatient Hospital Stay (HOSPITAL_COMMUNITY)
Admission: EM | Admit: 2022-03-15 | Discharge: 2022-03-21 | DRG: 871 | Disposition: A | Discharge status: Home Health | Payer: Medicare HMO | Attending: Internal Medicine | Admitting: Internal Medicine

## 2022-03-15 ENCOUNTER — Other Ambulatory Visit: Payer: Self-pay

## 2022-03-15 DIAGNOSIS — Z8249 Family history of ischemic heart disease and other diseases of the circulatory system: Secondary | ICD-10-CM

## 2022-03-15 DIAGNOSIS — I5033 Acute on chronic diastolic (congestive) heart failure: Secondary | ICD-10-CM | POA: Diagnosis present

## 2022-03-15 DIAGNOSIS — L538 Other specified erythematous conditions: Secondary | ICD-10-CM | POA: Diagnosis not present

## 2022-03-15 DIAGNOSIS — Z6830 Body mass index (BMI) 30.0-30.9, adult: Secondary | ICD-10-CM

## 2022-03-15 DIAGNOSIS — Z841 Family history of disorders of kidney and ureter: Secondary | ICD-10-CM

## 2022-03-15 DIAGNOSIS — Z885 Allergy status to narcotic agent status: Secondary | ICD-10-CM

## 2022-03-15 DIAGNOSIS — E039 Hypothyroidism, unspecified: Secondary | ICD-10-CM | POA: Diagnosis present

## 2022-03-15 DIAGNOSIS — I4891 Unspecified atrial fibrillation: Secondary | ICD-10-CM | POA: Diagnosis not present

## 2022-03-15 DIAGNOSIS — K219 Gastro-esophageal reflux disease without esophagitis: Secondary | ICD-10-CM | POA: Diagnosis present

## 2022-03-15 DIAGNOSIS — Z1152 Encounter for screening for COVID-19: Secondary | ICD-10-CM | POA: Diagnosis not present

## 2022-03-15 DIAGNOSIS — Z7982 Long term (current) use of aspirin: Secondary | ICD-10-CM | POA: Diagnosis not present

## 2022-03-15 DIAGNOSIS — G4733 Obstructive sleep apnea (adult) (pediatric): Secondary | ICD-10-CM | POA: Diagnosis present

## 2022-03-15 DIAGNOSIS — I5032 Chronic diastolic (congestive) heart failure: Secondary | ICD-10-CM | POA: Diagnosis not present

## 2022-03-15 DIAGNOSIS — E876 Hypokalemia: Secondary | ICD-10-CM | POA: Diagnosis present

## 2022-03-15 DIAGNOSIS — R040 Epistaxis: Secondary | ICD-10-CM | POA: Diagnosis present

## 2022-03-15 DIAGNOSIS — R6520 Severe sepsis without septic shock: Secondary | ICD-10-CM | POA: Diagnosis present

## 2022-03-15 DIAGNOSIS — Z79890 Hormone replacement therapy (postmenopausal): Secondary | ICD-10-CM

## 2022-03-15 DIAGNOSIS — D869 Sarcoidosis, unspecified: Secondary | ICD-10-CM | POA: Diagnosis present

## 2022-03-15 DIAGNOSIS — R0789 Other chest pain: Secondary | ICD-10-CM | POA: Diagnosis not present

## 2022-03-15 DIAGNOSIS — I10 Essential (primary) hypertension: Secondary | ICD-10-CM | POA: Diagnosis present

## 2022-03-15 DIAGNOSIS — M5410 Radiculopathy, site unspecified: Secondary | ICD-10-CM | POA: Diagnosis present

## 2022-03-15 DIAGNOSIS — L0390 Cellulitis, unspecified: Secondary | ICD-10-CM | POA: Diagnosis not present

## 2022-03-15 DIAGNOSIS — E1122 Type 2 diabetes mellitus with diabetic chronic kidney disease: Secondary | ICD-10-CM | POA: Diagnosis present

## 2022-03-15 DIAGNOSIS — L03116 Cellulitis of left lower limb: Secondary | ICD-10-CM | POA: Diagnosis not present

## 2022-03-15 DIAGNOSIS — E119 Type 2 diabetes mellitus without complications: Secondary | ICD-10-CM

## 2022-03-15 DIAGNOSIS — R000 Tachycardia, unspecified: Secondary | ICD-10-CM | POA: Diagnosis not present

## 2022-03-15 DIAGNOSIS — Z9841 Cataract extraction status, right eye: Secondary | ICD-10-CM

## 2022-03-15 DIAGNOSIS — E1165 Type 2 diabetes mellitus with hyperglycemia: Secondary | ICD-10-CM | POA: Diagnosis not present

## 2022-03-15 DIAGNOSIS — D649 Anemia, unspecified: Secondary | ICD-10-CM | POA: Diagnosis present

## 2022-03-15 DIAGNOSIS — N1831 Chronic kidney disease, stage 3a: Secondary | ICD-10-CM | POA: Diagnosis present

## 2022-03-15 DIAGNOSIS — M7989 Other specified soft tissue disorders: Secondary | ICD-10-CM | POA: Diagnosis not present

## 2022-03-15 DIAGNOSIS — R918 Other nonspecific abnormal finding of lung field: Secondary | ICD-10-CM | POA: Diagnosis not present

## 2022-03-15 DIAGNOSIS — G8929 Other chronic pain: Secondary | ICD-10-CM | POA: Diagnosis present

## 2022-03-15 DIAGNOSIS — R52 Pain, unspecified: Secondary | ICD-10-CM | POA: Diagnosis not present

## 2022-03-15 DIAGNOSIS — Z9842 Cataract extraction status, left eye: Secondary | ICD-10-CM

## 2022-03-15 DIAGNOSIS — Z794 Long term (current) use of insulin: Secondary | ICD-10-CM

## 2022-03-15 DIAGNOSIS — Z833 Family history of diabetes mellitus: Secondary | ICD-10-CM

## 2022-03-15 DIAGNOSIS — J90 Pleural effusion, not elsewhere classified: Secondary | ICD-10-CM | POA: Diagnosis not present

## 2022-03-15 DIAGNOSIS — E7800 Pure hypercholesterolemia, unspecified: Secondary | ICD-10-CM | POA: Diagnosis not present

## 2022-03-15 DIAGNOSIS — A419 Sepsis, unspecified organism: Secondary | ICD-10-CM | POA: Diagnosis not present

## 2022-03-15 DIAGNOSIS — J189 Pneumonia, unspecified organism: Secondary | ICD-10-CM | POA: Diagnosis not present

## 2022-03-15 DIAGNOSIS — Z7952 Long term (current) use of systemic steroids: Secondary | ICD-10-CM

## 2022-03-15 DIAGNOSIS — Z825 Family history of asthma and other chronic lower respiratory diseases: Secondary | ICD-10-CM

## 2022-03-15 DIAGNOSIS — Z955 Presence of coronary angioplasty implant and graft: Secondary | ICD-10-CM

## 2022-03-15 DIAGNOSIS — M549 Dorsalgia, unspecified: Secondary | ICD-10-CM | POA: Diagnosis present

## 2022-03-15 DIAGNOSIS — Z8639 Personal history of other endocrine, nutritional and metabolic disease: Secondary | ICD-10-CM

## 2022-03-15 DIAGNOSIS — I517 Cardiomegaly: Secondary | ICD-10-CM | POA: Diagnosis not present

## 2022-03-15 DIAGNOSIS — Z7902 Long term (current) use of antithrombotics/antiplatelets: Secondary | ICD-10-CM

## 2022-03-15 DIAGNOSIS — D860 Sarcoidosis of lung: Secondary | ICD-10-CM | POA: Diagnosis not present

## 2022-03-15 DIAGNOSIS — N400 Enlarged prostate without lower urinary tract symptoms: Secondary | ICD-10-CM | POA: Diagnosis not present

## 2022-03-15 DIAGNOSIS — I130 Hypertensive heart and chronic kidney disease with heart failure and stage 1 through stage 4 chronic kidney disease, or unspecified chronic kidney disease: Secondary | ICD-10-CM | POA: Diagnosis not present

## 2022-03-15 DIAGNOSIS — E669 Obesity, unspecified: Secondary | ICD-10-CM | POA: Diagnosis not present

## 2022-03-15 DIAGNOSIS — I2510 Atherosclerotic heart disease of native coronary artery without angina pectoris: Secondary | ICD-10-CM | POA: Diagnosis present

## 2022-03-15 DIAGNOSIS — I70202 Unspecified atherosclerosis of native arteries of extremities, left leg: Secondary | ICD-10-CM | POA: Diagnosis present

## 2022-03-15 DIAGNOSIS — E1151 Type 2 diabetes mellitus with diabetic peripheral angiopathy without gangrene: Secondary | ICD-10-CM | POA: Diagnosis present

## 2022-03-15 DIAGNOSIS — E2740 Unspecified adrenocortical insufficiency: Secondary | ICD-10-CM | POA: Diagnosis not present

## 2022-03-15 DIAGNOSIS — D849 Immunodeficiency, unspecified: Secondary | ICD-10-CM | POA: Diagnosis present

## 2022-03-15 DIAGNOSIS — I491 Atrial premature depolarization: Secondary | ICD-10-CM | POA: Diagnosis not present

## 2022-03-15 DIAGNOSIS — N179 Acute kidney failure, unspecified: Secondary | ICD-10-CM | POA: Diagnosis not present

## 2022-03-15 DIAGNOSIS — R008 Other abnormalities of heart beat: Secondary | ICD-10-CM | POA: Diagnosis not present

## 2022-03-15 DIAGNOSIS — E785 Hyperlipidemia, unspecified: Secondary | ICD-10-CM | POA: Diagnosis present

## 2022-03-15 DIAGNOSIS — Z981 Arthrodesis status: Secondary | ICD-10-CM

## 2022-03-15 DIAGNOSIS — R1110 Vomiting, unspecified: Secondary | ICD-10-CM | POA: Diagnosis not present

## 2022-03-15 DIAGNOSIS — Z888 Allergy status to other drugs, medicaments and biological substances status: Secondary | ICD-10-CM

## 2022-03-15 DIAGNOSIS — J9811 Atelectasis: Secondary | ICD-10-CM | POA: Diagnosis not present

## 2022-03-15 DIAGNOSIS — R0989 Other specified symptoms and signs involving the circulatory and respiratory systems: Secondary | ICD-10-CM | POA: Diagnosis not present

## 2022-03-15 DIAGNOSIS — I739 Peripheral vascular disease, unspecified: Secondary | ICD-10-CM | POA: Diagnosis present

## 2022-03-15 DIAGNOSIS — R079 Chest pain, unspecified: Secondary | ICD-10-CM | POA: Diagnosis not present

## 2022-03-15 LAB — URINALYSIS, ROUTINE W REFLEX MICROSCOPIC
Bacteria, UA: NONE SEEN
Bilirubin Urine: NEGATIVE
Glucose, UA: NEGATIVE mg/dL
Ketones, ur: NEGATIVE mg/dL
Leukocytes,Ua: NEGATIVE
Nitrite: NEGATIVE
Protein, ur: 100 mg/dL — AB
Specific Gravity, Urine: 1.014 (ref 1.005–1.030)
pH: 5 (ref 5.0–8.0)

## 2022-03-15 LAB — COMPREHENSIVE METABOLIC PANEL WITH GFR
ALT: 38 U/L (ref 0–44)
AST: 48 U/L — ABNORMAL HIGH (ref 15–41)
Albumin: 3.1 g/dL — ABNORMAL LOW (ref 3.5–5.0)
Alkaline Phosphatase: 60 U/L (ref 38–126)
Anion gap: 12 (ref 5–15)
BUN: 23 mg/dL (ref 8–23)
CO2: 31 mmol/L (ref 22–32)
Calcium: 9.1 mg/dL (ref 8.9–10.3)
Chloride: 98 mmol/L (ref 98–111)
Creatinine, Ser: 1.51 mg/dL — ABNORMAL HIGH (ref 0.61–1.24)
GFR, Estimated: 48 mL/min — ABNORMAL LOW (ref >60)
Glucose, Bld: 102 mg/dL — ABNORMAL HIGH (ref 70–99)
Potassium: 2.2 mmol/L — CL (ref 3.5–5.1)
Sodium: 141 mmol/L (ref 135–145)
Total Bilirubin: 1 mg/dL (ref 0.3–1.2)
Total Protein: 6.7 g/dL (ref 6.5–8.1)

## 2022-03-15 LAB — CBC WITH DIFFERENTIAL/PLATELET
Abs Immature Granulocytes: 0.64 K/uL — ABNORMAL HIGH (ref 0.00–0.07)
Basophils Absolute: 0.1 K/uL (ref 0.0–0.1)
Basophils Relative: 0 %
Eosinophils Absolute: 0 K/uL (ref 0.0–0.5)
Eosinophils Relative: 0 %
HCT: 45.8 % (ref 39.0–52.0)
Hemoglobin: 14 g/dL (ref 13.0–17.0)
Immature Granulocytes: 2 %
Lymphocytes Relative: 4 %
Lymphs Abs: 1.3 K/uL (ref 0.7–4.0)
MCH: 28 pg (ref 26.0–34.0)
MCHC: 30.6 g/dL (ref 30.0–36.0)
MCV: 91.6 fL (ref 80.0–100.0)
Monocytes Absolute: 1.5 K/uL — ABNORMAL HIGH (ref 0.1–1.0)
Monocytes Relative: 4 %
Neutro Abs: 30.8 K/uL — ABNORMAL HIGH (ref 1.7–7.7)
Neutrophils Relative %: 90 %
Platelets: 305 K/uL (ref 150–400)
RBC: 5 MIL/uL (ref 4.22–5.81)
RDW: 19.4 % — ABNORMAL HIGH (ref 11.5–15.5)
WBC: 34.3 K/uL — ABNORMAL HIGH (ref 4.0–10.5)
nRBC: 0 % (ref 0.0–0.2)

## 2022-03-15 LAB — PROTIME-INR
INR: 1.1 (ref 0.8–1.2)
Prothrombin Time: 14.5 s (ref 11.4–15.2)

## 2022-03-15 LAB — APTT: aPTT: 26 s (ref 24–36)

## 2022-03-15 LAB — RESP PANEL BY RT-PCR (RSV, FLU A&B, COVID)  RVPGX2
Influenza A by PCR: NEGATIVE
Influenza B by PCR: NEGATIVE
Resp Syncytial Virus by PCR: NEGATIVE
SARS Coronavirus 2 by RT PCR: NEGATIVE

## 2022-03-15 LAB — LACTIC ACID, PLASMA
Lactic Acid, Venous: 2.8 mmol/L (ref 0.5–1.9)
Lactic Acid, Venous: 1.7 mmol/L (ref 0.5–1.9)

## 2022-03-15 LAB — CBG MONITORING, ED: Glucose-Capillary: 236 mg/dL — ABNORMAL HIGH (ref 70–99)

## 2022-03-15 LAB — MAGNESIUM: Magnesium: 1.6 mg/dL — ABNORMAL LOW (ref 1.7–2.4)

## 2022-03-15 LAB — POTASSIUM: Potassium: 3.7 mmol/L (ref 3.5–5.1)

## 2022-03-15 MED ORDER — ACETAMINOPHEN 500 MG PO TABS
1000.0000 mg | ORAL_TABLET | Freq: Once | ORAL | Status: AC
  Administered 2022-03-15: 1000 mg via ORAL
  Filled 2022-03-15: qty 2

## 2022-03-15 MED ORDER — METOPROLOL TARTRATE 12.5 MG HALF TABLET
12.5000 mg | ORAL_TABLET | Freq: Two times a day (BID) | ORAL | Status: DC
  Administered 2022-03-16 – 2022-03-21 (×11): 12.5 mg via ORAL
  Filled 2022-03-15 (×11): qty 1

## 2022-03-15 MED ORDER — LEVOTHYROXINE SODIUM 75 MCG PO TABS
75.0000 ug | ORAL_TABLET | Freq: Every day | ORAL | Status: DC
  Administered 2022-03-16 – 2022-03-21 (×6): 75 ug via ORAL
  Filled 2022-03-15 (×6): qty 1

## 2022-03-15 MED ORDER — SENNOSIDES-DOCUSATE SODIUM 8.6-50 MG PO TABS
1.0000 | ORAL_TABLET | Freq: Every evening | ORAL | Status: DC | PRN
  Administered 2022-03-19 – 2022-03-20 (×2): 1 via ORAL
  Filled 2022-03-15 (×2): qty 1

## 2022-03-15 MED ORDER — ONDANSETRON HCL 4 MG/2ML IJ SOLN
4.0000 mg | Freq: Once | INTRAMUSCULAR | Status: AC
  Administered 2022-03-15: 4 mg via INTRAVENOUS
  Filled 2022-03-15: qty 2

## 2022-03-15 MED ORDER — POTASSIUM CHLORIDE 10 MEQ/100ML IV SOLN
10.0000 meq | INTRAVENOUS | Status: DC
  Administered 2022-03-15: 10 meq via INTRAVENOUS
  Filled 2022-03-15: qty 100

## 2022-03-15 MED ORDER — HYDROCORTISONE SOD SUC (PF) 100 MG IJ SOLR
25.0000 mg | Freq: Four times a day (QID) | INTRAMUSCULAR | Status: DC
  Administered 2022-03-15 – 2022-03-16 (×3): 25 mg via INTRAVENOUS
  Filled 2022-03-15 (×3): qty 2

## 2022-03-15 MED ORDER — INSULIN ASPART PROT & ASPART (70-30 MIX) 100 UNIT/ML ~~LOC~~ SUSP
15.0000 [IU] | Freq: Two times a day (BID) | SUBCUTANEOUS | Status: DC
  Administered 2022-03-16: 15 [IU] via SUBCUTANEOUS
  Filled 2022-03-15: qty 10

## 2022-03-15 MED ORDER — VANCOMYCIN HCL IN DEXTROSE 1-5 GM/200ML-% IV SOLN
1000.0000 mg | INTRAVENOUS | Status: AC
  Filled 2022-03-15 (×2): qty 200

## 2022-03-15 MED ORDER — SODIUM CHLORIDE 0.9% FLUSH
3.0000 mL | Freq: Two times a day (BID) | INTRAVENOUS | Status: DC
  Administered 2022-03-15 – 2022-03-21 (×10): 3 mL via INTRAVENOUS

## 2022-03-15 MED ORDER — VANCOMYCIN HCL IN DEXTROSE 1-5 GM/200ML-% IV SOLN
1000.0000 mg | INTRAVENOUS | Status: DC
  Administered 2022-03-16: 1000 mg via INTRAVENOUS
  Filled 2022-03-15 (×3): qty 200

## 2022-03-15 MED ORDER — ACETAMINOPHEN 650 MG RE SUPP: 650.0000 mg | Freq: Four times a day (QID) | RECTAL | Status: DC | PRN

## 2022-03-15 MED ORDER — SODIUM CHLORIDE 0.9 % IV SOLN
2.0000 g | Freq: Two times a day (BID) | INTRAVENOUS | Status: DC
  Administered 2022-03-16 – 2022-03-17 (×3): 2 g via INTRAVENOUS
  Filled 2022-03-15 (×3): qty 12.5

## 2022-03-15 MED ORDER — SODIUM CHLORIDE 0.9 % IV BOLUS (SEPSIS)
1000.0000 mL | Freq: Once | INTRAVENOUS | Status: AC
  Administered 2022-03-15: 1000 mL via INTRAVENOUS

## 2022-03-15 MED ORDER — ENOXAPARIN SODIUM 40 MG/0.4ML IJ SOSY
40.0000 mg | PREFILLED_SYRINGE | INTRAMUSCULAR | Status: DC
  Administered 2022-03-15 – 2022-03-20 (×6): 40 mg via SUBCUTANEOUS
  Filled 2022-03-15 (×6): qty 0.4

## 2022-03-15 MED ORDER — INSULIN ASPART 100 UNIT/ML IJ SOLN
0.0000 [IU] | Freq: Three times a day (TID) | INTRAMUSCULAR | Status: DC
  Administered 2022-03-16: 5 [IU] via SUBCUTANEOUS
  Administered 2022-03-16 (×2): 11 [IU] via SUBCUTANEOUS
  Administered 2022-03-17 (×2): 8 [IU] via SUBCUTANEOUS

## 2022-03-15 MED ORDER — ROSUVASTATIN CALCIUM 20 MG PO TABS
20.0000 mg | ORAL_TABLET | Freq: Every day | ORAL | Status: DC
  Administered 2022-03-15 – 2022-03-20 (×6): 20 mg via ORAL
  Filled 2022-03-15 (×6): qty 1

## 2022-03-15 MED ORDER — VANCOMYCIN HCL IN DEXTROSE 1-5 GM/200ML-% IV SOLN
1000.0000 mg | INTRAVENOUS | Status: AC
  Administered 2022-03-15 (×2): 1000 mg via INTRAVENOUS
  Filled 2022-03-15: qty 200

## 2022-03-15 MED ORDER — ALBUTEROL SULFATE (2.5 MG/3ML) 0.083% IN NEBU
2.5000 mg | INHALATION_SOLUTION | Freq: Four times a day (QID) | RESPIRATORY_TRACT | Status: DC | PRN
  Administered 2022-03-19: 2.5 mg via RESPIRATORY_TRACT
  Filled 2022-03-15: qty 3

## 2022-03-15 MED ORDER — IOHEXOL 350 MG/ML SOLN
75.0000 mL | Freq: Once | INTRAVENOUS | Status: AC | PRN
  Administered 2022-03-15: 75 mL via INTRAVENOUS

## 2022-03-15 MED ORDER — VANCOMYCIN HCL IN DEXTROSE 1-5 GM/200ML-% IV SOLN: 1000.0000 mg | Freq: Once | INTRAVENOUS | Status: DC

## 2022-03-15 MED ORDER — ACETAMINOPHEN 325 MG PO TABS
650.0000 mg | ORAL_TABLET | Freq: Four times a day (QID) | ORAL | Status: DC | PRN
  Administered 2022-03-17: 650 mg via ORAL
  Filled 2022-03-15: qty 2

## 2022-03-15 MED ORDER — HYDROCORTISONE SOD SUC (PF) 100 MG IJ SOLR
100.0000 mg | Freq: Once | INTRAMUSCULAR | Status: AC
  Administered 2022-03-15: 100 mg via INTRAVENOUS
  Filled 2022-03-15: qty 2

## 2022-03-15 MED ORDER — POTASSIUM CHLORIDE CRYS ER 20 MEQ PO TBCR
40.0000 meq | EXTENDED_RELEASE_TABLET | Freq: Once | ORAL | Status: AC
  Administered 2022-03-15: 40 meq via ORAL
  Filled 2022-03-15: qty 2

## 2022-03-15 MED ORDER — PANTOPRAZOLE SODIUM 40 MG PO TBEC
40.0000 mg | DELAYED_RELEASE_TABLET | Freq: Every day | ORAL | Status: DC
  Administered 2022-03-16 – 2022-03-21 (×6): 40 mg via ORAL
  Filled 2022-03-15 (×6): qty 1

## 2022-03-15 MED ORDER — SODIUM CHLORIDE 0.9 % IV SOLN
2.0000 g | Freq: Once | INTRAVENOUS | Status: AC
  Administered 2022-03-15: 2 g via INTRAVENOUS
  Filled 2022-03-15: qty 12.5

## 2022-03-15 MED ORDER — POTASSIUM CHLORIDE 10 MEQ/100ML IV SOLN
10.0000 meq | INTRAVENOUS | Status: AC
  Administered 2022-03-15 (×4): 10 meq via INTRAVENOUS
  Filled 2022-03-15 (×4): qty 100

## 2022-03-15 MED ORDER — MAGNESIUM SULFATE 2 GM/50ML IV SOLN
2.0000 g | Freq: Once | INTRAVENOUS | Status: AC
  Administered 2022-03-15: 2 g via INTRAVENOUS
  Filled 2022-03-15: qty 50

## 2022-03-15 NOTE — Progress Notes (Signed)
Pharmacy Antibiotic Note  [name] is a 77 y.o. male admitted on 03/15/2022 with pneumonia.  Pharmacy has been consulted for Cefepime and Vancomycin dosing.  WBC 34.3, Tmax 103.4, HR 100, RR 33 SCr 1.51 (baseline SCr ~1.1)  Plan: Initiate Cefepime 2g IV q12hr x 7days Initiate loading dose of Vancomycin '2000mg'$  IV x 1, followed by  Vancomycin '1000mg'$  IV q24h (eAUC ~512) for a total of 7 days    > Goal AUC 400-550    > Check vancomycin levels at steady state  Check MRSA PCR Monitor daily CBC, temp, SCr, and for clinical signs of improvement  F/u cultures and de-escalate antibiotics as able    Height: '5\' 10"'$  (177.8 cm) Weight: 97.5 kg (215 lb) IBW/kg (Calculated) : 73  Temp (24hrs), Avg:103.4 F (39.7 C), Min:103.4 F (39.7 C), Max:103.4 F (39.7 C)  Recent Labs  Lab 03/15/22 1225  WBC 34.3*    CrCl cannot be calculated (Patient's most recent lab result is older than the maximum 21 days allowed.).    Allergies  Allergen Reactions   Statins Other (See Comments)    Pt says they make him "mean"   Hydrocortisone Nausea Only   Ranexa [Ranolazine] Other (See Comments)    Severe weakness/near syncope after 1 dose Hallucinations    Hydrocodone Nausea And Vomiting   Miralax [Polyethylene Glycol] Nausea And Vomiting   Z-Pak [Azithromycin] Other (See Comments)    Raises blood sugar   Zaroxolyn [Metolazone] Other (See Comments)    Drained, no energy    Antimicrobials this admission: Cefepime 1/9 >>  Vancomycin 1/9 >>   Dose adjustments this admission: N/A  Microbiology results: 1/9 BCx: pending 1/9 UCx: pending  1/9 MRSA PCR: ordered  Thank you for allowing pharmacy to be a part of this patient's care.  Luisa Hart, PharmD, BCPS Clinical Pharmacist 03/15/2022 11:50 AM   Please refer to [facility] for pharmacy phone number

## 2022-03-15 NOTE — ED Triage Notes (Addendum)
Pt BIB [facility] from home c/o being sick x1 month. Pt woke up at 3am this morning stating he felt terrible and started having emesis episodes. Pt is also c/o generalized weakness and was unable to get him out of bed. Right before EMS arrived pt began having left sided CP described as a tightness with no pain radiation. Pt does endorse SHOB and hematemesis.  Pt did get 1 nitro with EMS.

## 2022-03-15 NOTE — ED Provider Notes (Addendum)
[facility] EMERGENCY DEPARTMENT Provider Note   CSN: [id number] Arrival date & time: 03/15/22  1111     History  Chief Complaint  Patient presents with   Weakness    [name] is a 77 y.o. male.  Is a 77 year old male here with nausea vomiting, body aches, fever, cough, generalized weakness.  He just finished a course of antibiotics for presumed lung infection.  Woke up this morning with fever and chills and nausea and vomiting and chest discomfort.  Patient has history of diabetes, CAD, CKD, high cholesterol.  Denies any pain with urination.  He is have some redness to his left lower leg.  Denies any diarrhea.  Having some abdominal discomfort as well.  The history is provided by the patient.       Home Medications Prior to Admission medications   Medication Sig Start Date End Date Taking? Authorizing Provider  acetaminophen (TYLENOL) 500 MG tablet Take 1,000 mg by mouth daily as needed for mild pain (pain).    [provider]  albuterol (PROVENTIL) (2.5 MG/3ML) 0.083% nebulizer solution Take 3 mLs (2.5 mg total) by nebulization every 6 (six) hours as needed for wheezing or shortness of breath. Dx Code D86.0 07/24/14   Elsie Stain, MD  Albuterol Sulfate (PROAIR RESPICLICK) 409 (90 BASE) MCG/ACT AEPB Inhale 2 puffs into the lungs every 6 (six) hours as needed. Patient taking differently: Inhale 2 puffs into the lungs as needed (for shortness of breath). 07/24/14   Elsie Stain, MD  aspirin EC 81 MG tablet Take 81 mg by mouth every evening.     [provider]  benzonatate (TESSALON) 200 MG capsule Take 200 mg by mouth as needed for cough. 01/14/22   [provider]  Cholecalciferol (VITAMIN D3) 3000 UNITS TABS Take 3,000 Units by mouth daily.    [provider]  clopidogrel (PLAVIX) 75 MG tablet Take 1 tablet (75 mg total) by mouth daily. Restart on 09/28/21 09/25/21   Heath Lark D, DO  Coenzyme Q10 (COQ-10) 400 MG  CAPS Take 400 mg by mouth 2 (two) times a week. Sunday, Wednesday    [provider]  ezetimibe (ZETIA) 10 MG tablet Take 10 mg by mouth once a week. 06/25/21   [provider]  furosemide (LASIX) 80 MG tablet Take 1 tablet (80 mg total) by mouth daily as needed for edema (take Lasix 80 mg as needed if gains more than 3 pounds in 1 day or 5 pounds in 1 week). Patient taking differently: Take 80 mg by mouth daily. 10/21/21   Antonieta Pert, MD  hydrALAZINE (APRESOLINE) 10 MG tablet Take 1 tablet (10 mg total) by mouth every 8 (eight) hours. 09/16/21   Geradine Girt, DO  insulin aspart protamine- aspart (NOVOLOG MIX 70/30) (70-30) 100 UNIT/ML injection Inject 0.4 mLs (40 Units total) into the skin with breakfast, with lunch, and with evening meal. Patient taking differently: Inject 25-35 Units into the skin with breakfast, with lunch, and with evening meal. 09/16/21   Geradine Girt, DO  Iron, Ferrous Sulfate, 325 (65 Fe) MG TABS Take 325 mg by mouth daily. 01/18/22   Gatha Mayer, MD  levothyroxine (SYNTHROID) 75 MCG tablet Take 75 mcg by mouth daily. 02/24/21   [provider]  metoprolol tartrate (LOPRESSOR) 25 MG tablet Take 0.5 tablets (12.5 mg total) by mouth 2 (two) times daily. 02/03/22   Elgie Collard, PA-C  montelukast (SINGULAIR) 10 MG tablet Take  10 mg by mouth daily as needed (allergies).  07/18/18   [provider]  Multiple Vitamin (MULTIVITAMIN WITH MINERALS) TABS tablet Take 1 tablet by mouth daily. 10/08/18   Hongalgi, Lenis Dickinson, MD  nitroGLYCERIN (NITROSTAT) 0.4 MG SL tablet Place 1 tablet (0.4 mg total) under the tongue every 5 (five) minutes as needed for chest pain. 07/01/20 11/13/21  Imogene Burn, PA-C  OVER THE COUNTER MEDICATION Take 1 tablet by mouth daily. Omega XL    [provider]  oxyCODONE (OXY IR/ROXICODONE) 5 MG immediate release tablet Take 1 tablet (5 mg total) by mouth every 4 (four) hours as needed for severe pain or  breakthrough pain. 09/25/21   Nita Sells, MD  potassium chloride (KLOR-CON M) 10 MEQ tablet Take 10 mEq by mouth daily.    [provider]  predniSONE (DELTASONE) 10 MG tablet Take PO 4 tabs daily x 2 days,3 tabs daily x 2 days,2 tabs daily x 2 days,then resume home dose 1 tab daily 10/21/21   Antonieta Pert, MD  RABEprazole (ACIPHEX) 20 MG tablet Take 20 mg by mouth daily. 09/28/21   [provider]  rosuvastatin (CRESTOR) 20 MG tablet Take 1 tablet (20 mg total) by mouth at bedtime. 02/10/22   Elgie Collard, PA-C      Allergies    Statins, Hydrocortisone, Ranexa [ranolazine], Hydrocodone, Miralax [polyethylene glycol], Z-pak [azithromycin], and Zaroxolyn [metolazone]    Review of Systems   Review of Systems  Physical Exam Updated Vital Signs BP (!) 149/60   Pulse 100   Temp (!) 103.4 F (39.7 C) (Rectal)   Resp (!) 33   Ht '5\' 10"'$  (1.778 m)   Wt 97.5 kg   SpO2 94%   BMI 30.85 kg/m  Physical Exam Vitals and nursing note reviewed.  Constitutional:      General: He is not in acute distress.    Appearance: He is well-developed. He is ill-appearing.  HENT:     Head: Normocephalic and atraumatic.  Eyes:     Extraocular Movements: Extraocular movements intact.     Conjunctiva/sclera: Conjunctivae normal.     Pupils: Pupils are equal, round, and reactive to light.  Cardiovascular:     Rate and Rhythm: Normal rate and regular rhythm.     Pulses: Normal pulses.     Heart sounds: No murmur heard. Pulmonary:     Effort: Pulmonary effort is normal.     Comments: Coarse breath sounds throughout Abdominal:     General: There is distension.     Palpations: Abdomen is soft.     Tenderness: There is abdominal tenderness.  Musculoskeletal:        General: No swelling.     Cervical back: Normal range of motion and neck supple.  Skin:    General: Skin is warm and dry.     Capillary Refill: Capillary refill takes less than 2 seconds.     Findings: Erythema (left  lower leg with erythema of the shin area) present.  Neurological:     General: No focal deficit present.     Mental Status: He is alert.  Psychiatric:        Mood and Affect: Mood normal.     ED Results / Procedures / Treatments   Labs (all labs ordered are listed, but only abnormal results are displayed) Labs Reviewed  LACTIC ACID, PLASMA - Abnormal; Notable for the following components:      Result Value   Lactic Acid, Venous 2.8 (*)  All other components within normal limits  COMPREHENSIVE METABOLIC PANEL - Abnormal; Notable for the following components:   Potassium 2.2 (*)    Glucose, Bld 102 (*)    Creatinine, Ser 1.51 (*)    Albumin 3.1 (*)    AST 48 (*)    GFR, Estimated 48 (*)    All other components within normal limits  CBC WITH DIFFERENTIAL/PLATELET - Abnormal; Notable for the following components:   WBC 34.3 (*)    RDW 19.4 (*)    Neutro Abs 30.8 (*)    Monocytes Absolute 1.5 (*)    Abs Immature Granulocytes 0.64 (*)    All other components within normal limits  RESP PANEL BY RT-PCR (RSV, FLU A&B, COVID)  RVPGX2  CULTURE, BLOOD (ROUTINE X 2)  CULTURE, BLOOD (ROUTINE X 2)  URINE CULTURE  PROTIME-INR  APTT  LACTIC ACID, PLASMA  URINALYSIS, ROUTINE W REFLEX MICROSCOPIC  MAGNESIUM    EKG EKG Interpretation  Date/Time:  Tuesday March 15 2022 11:19:11 EST Ventricular Rate:  112 PR Interval:  148 QRS Duration: 135 QT Interval:  365 QTC Calculation: 499 R Axis:   -80 Text Interpretation: Ectopic atrial tachycardia, unifocal Paired ventricular premature complexes Confirmed by Lennice Sites ([id number]) on 03/15/2022 11:35:20 AM  Radiology DG Chest Port 1 View  Result Date: 03/15/2022 CLINICAL DATA:  One-month history of with acute onset emesis and left-sided chest pain EXAM: PORTABLE CHEST 1 VIEW COMPARISON:  Chest radiograph dated 02/04/2022 FINDINGS: Normal lung volumes. Bibasilar patchy opacities. No pleural effusion or pneumothorax. Similar enlarged  cardiomediastinal silhouette. The visualized skeletal structures are unremarkable. Right upper quadrant surgical clips. IMPRESSION: Bibasilar patchy opacities, which may represent atelectasis, aspiration, or pneumonia. Electronically Signed   By: Darrin Nipper M.D.   On: 03/15/2022 12:40    Procedures .Critical Care  Performed by: Lennice Sites, DO Authorized by: Lennice Sites, DO   Critical care provider statement:    Critical care time (minutes):  40   Critical care was necessary to treat or prevent imminent or life-threatening deterioration of the following conditions:  Sepsis   Critical care was time spent personally by me on the following activities:  Blood draw for specimens, development of treatment plan with patient or surrogate, discussions with primary provider, evaluation of patient's response to treatment, examination of patient, obtaining history from patient or surrogate, ordering and performing treatments and interventions, ordering and review of laboratory studies, ordering and review of radiographic studies, pulse oximetry, re-evaluation of patient's condition and review of old charts   I assumed direction of critical care for this patient from another provider in my specialty: no       Medications Ordered in ED Medications  vancomycin (VANCOCIN) IVPB 1000 mg/200 mL premix (1,000 mg Intravenous Not Given 03/15/22 1433)  potassium chloride 10 mEq in 100 mL IVPB (has no administration in time range)  ceFEPIme (MAXIPIME) 2 g in sodium chloride 0.9 % 100 mL IVPB (has no administration in time range)  vancomycin (VANCOCIN) IVPB 1000 mg/200 mL premix (has no administration in time range)  vancomycin (VANCOCIN) IVPB 1000 mg/200 mL premix (1,000 mg Intravenous New Bag/Given 03/15/22 1439)  sodium chloride 0.9 % bolus 1,000 mL (1,000 mLs Intravenous New Bag/Given 03/15/22 1322)  ceFEPIme (MAXIPIME) 2 g in sodium chloride 0.9 % 100 mL IVPB (2 g Intravenous New Bag/Given 03/15/22 1326)   acetaminophen (TYLENOL) tablet 1,000 mg (1,000 mg Oral Given 03/15/22 1326)  ondansetron (ZOFRAN) injection 4 mg (4 mg Intravenous Given 03/15/22 1326)  ED Course/ Medical Decision Making/ A&P                           Medical Decision Making Amount and/or Complexity of Data Reviewed Labs: ordered. Radiology: ordered. ECG/medicine tests: ordered.  Risk OTC drugs. Prescription drug management.   [name] is here with generalized weakness, nausea vomiting, chest discomfort.  Patient arrives with fever 103.4, tachycardia but otherwise unremarkable vitals.  Put on 2 L of oxygen his pulse ox was in the low 90s, upper 80s.  He just finished antibiotics for possible lung infection.  He had illness for the last few weeks.  He started feeling a lot worse overnight with nausea and vomiting and fever and chills.  Started having some left-sided chest pain but no relation to vomiting.  EKG upon arrival shows sinus rhythm.  No obvious ischemic changes.  Code STEMI was activated in the field but was canceled by cardiology team.  EKG does not appear to be consistent with ischemic process and overall given his fever I suspect sepsis process.  He appears to have may be even a cellulitis on his left lower leg but also having abdominal pain and nausea and vomiting as well as some cough and sputum production.  So source of infection could be multiple areas.  Broad-spectrum IV antibiotics started including CBC, blood cultures, lactic acid, fluid bolus, urine studies, COVID and flu testing.  Will likely pursue CT scan abdomen and pelvis.  Patient has history of CKD, CAD, diabetes.  Per my review interpretation of labs patient has a potassium of 2.2 and will start repletion.  Creatinine of 1.5.  Lactic acid 2.8.  White count of 34.  Chest x-ray per my review seems consistent with pneumonia.  However to further evaluate infectious process we will get a CT scan of chest abdomen pelvis given that he is having some nausea  and vomiting and abdominal pain as well.  He was recently treated for pneumonia had some hypoxia and overall suspect pneumonia as a source but could also have a cellulitis on his left lower leg as well.  Overall has been hemodynamically stable throughout my care.  Handed off to oncoming ED staff with patient pending CT images to rule out further infectious process/surgical process.  Anticipate admission afterwards.  Please see their note for further results, evaluation, disposition of the patient.  This chart was dictated using voice recognition software.  Despite best efforts to proofread,  errors can occur which can change the documentation meaning.         Final Clinical Impression(s) / ED Diagnoses Final diagnoses:  Sepsis, due to unspecified organism, unspecified whether acute organ dysfunction present ([place])  Cellulitis, unspecified cellulitis site  Community acquired pneumonia, unspecified laterality    Rx / DC Orders ED Discharge Orders     None         Lennice Sites, DO 03/15/22 [place], [place], DO 03/15/22 1447

## 2022-03-15 NOTE — Progress Notes (Signed)
Mg came back at 1.6 this PM. Ok to replace with 2g IV mg per Dr. Trilby Drummer.  Onnie Boer, PharmD, BCIDP, AAHIVP, CPP Infectious Disease Pharmacist 03/15/2022 9:04 PM

## 2022-03-15 NOTE — ED Notes (Signed)
Critical lactic 2.8. Provider made aware.

## 2022-03-15 NOTE — Progress Notes (Signed)
Elink following Code Sepsis. 

## 2022-03-15 NOTE — H&P (Addendum)
History and Physical   TRUE GARCIAMARTINEZ BSW:[id number] DOB: 03-25-1945 DOA: 03/15/2022  PCP: Shirline Frees, MD   Patient coming from: Home  Chief Complaint: Weakness  HPI: Craig DEVINCENZI is a 77 y.o. male with medical history significant of diabetes, CKD 3, PAD, hypothyroidism, hyperlipidemia, hypertension, CAD, history of adrenal sufficiency, sarcoidosis, OSA, obesity, anemia, GERD, diastolic CHF presenting with weakness.  Patient presenting with weakness and associated nausea, vomiting, body aches, fever, cough.  Patient was recently treated outpatient with a course of antibiotics and steroids for lung infection/pneumonia by PCP.  He recently completed this course.  But he woke this morning with fever and chills and had associated nausea and vomiting this morning.  Also feeling generally weak as above.  He denies chest pain, shortness of breath, abdominal pain, constipation, diarrhea  ED Course: Vital signs in the ED significant for fever to 103.4, blood pressure in the 46K to 599J systolic, heart rate in the 70s to 100s, respiratory rate in the 20s on room air.  Lab workup included CMP with potassium 2.2, creatinine elevated to 1.51 from baseline 1.2, glucose 102, albumin 3.1, AST 48.  CBC with leukocytosis to 34.3.  PT, PTT, INR within normal limits.  Initial lactic acid elevated to 2.8 with repeat pending.  Respiratory panel for flu COVID and RSV negative.  Urinalysis showing hemoglobin and protein only.  Urine culture and blood culture pending.  Magnesium level pending.  Chest x-ray showing basilar patchy opacities.  CT of the chest abdomen pelvis showing no specific cause for patient's presentation, slight right upper lobe groundglass opacities, airway thickening, trace pleural effusion, mild cardiomegaly, old granulomatous disease, mild prostamegaly.  Patient received vancomycin, cefepime in the ED also received Solu-Cortef IV, 1 L of IV fluids, 40 mill equivalents of IV potassium, Zofran.  Review of  Systems: As per HPI otherwise all other systems reviewed and are negative.  Past Medical History:  Diagnosis Date   Adrenal insufficiency ([place])    Allergy    Anemia    Arthritis    feet    Broken foot 09/2019   had to wore a boot. Left    CKD (chronic kidney disease), stage III (HCC)    Coronary artery disease    has stents   Coronary atherosclerosis of native coronary artery    Proximal LAD, posterior lateral stent widely patent-10/12/11   Diabetes mellitus    insulin and pills   Hearing loss    wears hearing aids   History of blood transfusion 06/30/2016   [facility] - 2 units transfused   Hyperlipidemia    Hypertension    OSA (obstructive sleep apnea)    uses VPAC sleep study 2 years done through [place]. Dr. Marlou Porch arranged study   PVD (peripheral vascular disease) ([place])    Sarcoidosis    Sleep apnea    uses CPAP nightly   Thyroid disease    Type 2 diabetes mellitus ([place])     Past Surgical History:  Procedure Laterality Date   APPENDECTOMY     CATARACT EXTRACTION  2011   bilat   CHOLECYSTECTOMY  01/25/2011   Procedure: LAPAROSCOPIC CHOLECYSTECTOMY WITH INTRAOPERATIVE CHOLANGIOGRAM;  Surgeon: Judieth Keens, DO;  Location: [facility] [place];  Service: General;  Laterality: N/A;   COLONOSCOPY     several   COLONOSCOPY WITH PROPOFOL N/A 01/03/2020   Procedure: COLONOSCOPY WITH PROPOFOL;  Surgeon: Gatha Mayer, MD;  Location: [facility];  Service: Endoscopy;  Laterality: N/A;   CORONARY ANGIOPLASTY  most recent 11/2009   CORONARY STENT INTERVENTION N/A 08/07/2018   Procedure: CORONARY STENT INTERVENTION;  Surgeon: Jettie Booze, MD;  Location: [place] [facility];  Service: Cardiovascular;  Laterality: N/A;   CORONARY STENT INTERVENTION N/A 01/18/2021   Procedure: CORONARY STENT INTERVENTION;  Surgeon: Sherren Mocha, MD;  Location: [place] [facility];  Service: Cardiovascular;  Laterality: N/A;   CORONARY STENT PLACEMENT  2009   in LAD and side branch PTCA    ESOPHAGOGASTRODUODENOSCOPY (EGD) WITH PROPOFOL N/A 01/03/2020   Procedure: ESOPHAGOGASTRODUODENOSCOPY (EGD) WITH PROPOFOL;  Surgeon: Gatha Mayer, MD;  Location: [facility];  Service: Endoscopy;  Laterality: N/A;   HEMOSTASIS CLIP PLACEMENT  01/03/2020   Procedure: HEMOSTASIS CLIP PLACEMENT;  Surgeon: Gatha Mayer, MD;  Location: [facility];  Service: Endoscopy;;   INTERCOSTAL NERVE BLOCK  2011, 06/2016   x2. lumbar spine   INTRAVASCULAR PRESSURE WIRE/FFR STUDY N/A 01/18/2021   Procedure: INTRAVASCULAR PRESSURE WIRE/FFR STUDY;  Surgeon: Sherren Mocha, MD;  Location: [place] [facility];  Service: Cardiovascular;  Laterality: N/A;   LEFT HEART CATHETERIZATION WITH CORONARY ANGIOGRAM Bilateral 10/12/2011   Procedure: LEFT HEART CATHETERIZATION WITH CORONARY ANGIOGRAM;  Surgeon: Candee Furbish, MD;  [facility];  Service: Cardiovascular;  Laterality: Bilateral;   LEFT HEART CATHETERIZATION WITH CORONARY ANGIOGRAM N/A 04/01/2014   Procedure: LEFT HEART CATHETERIZATION WITH CORONARY ANGIOGRAM;  Surgeon: Candee Furbish, MD;  Location: [facility] CATH LAB;  Service: Cardiovascular;  Laterality: N/A;   POLYPECTOMY  01/03/2020   Procedure: POLYPECTOMY;  Surgeon: Gatha Mayer, MD;  [facility]: [facility];  Service: Endoscopy;;   RIGHT/LEFT HEART CATH AND CORONARY ANGIOGRAPHY N/A 08/07/2018   Procedure: RIGHT/LEFT HEART CATH AND CORONARY ANGIOGRAPHY;  Surgeon: Jettie Booze, MD;  Location: [place] [facility];  Service: Cardiovascular;  Laterality: N/A;   RIGHT/LEFT HEART CATH AND CORONARY ANGIOGRAPHY N/A 12/25/2020   Procedure: RIGHT/LEFT HEART CATH AND CORONARY ANGIOGRAPHY;  Surgeon: Belva Crome, MD;  Location: [place] [facility];  Service: Cardiovascular;  Laterality: N/A;   TRANSFORAMINAL LUMBAR INTERBODY FUSION W/ MIS 1 LEVEL N/A 09/24/2021   Procedure: LUMBAR FOUR-FIVE MINIMALLY INVASIVE (MIS) TRANSFORAMINAL LUMBAR INTERBODY FUSION (TLIF) WITH METRX;  Surgeon: Judith Part, MD;   Location: [place];  Service: Neurosurgery;  Laterality: N/A;    Social History  reports that he has never smoked. He has never used smokeless tobacco. He reports that he does not drink alcohol and does not use drugs.  Allergies  Allergen Reactions   Statins Other (See Comments)    Pt says they make him "mean"   Hydrocortisone Nausea Only   Ranexa [Ranolazine] Other (See Comments)    Severe weakness/near syncope after 1 dose Hallucinations    Hydrocodone Nausea And Vomiting   Miralax [Polyethylene Glycol] Nausea And Vomiting   Z-Pak [Azithromycin] Other (See Comments)    Raises blood sugar   Zaroxolyn [Metolazone] Other (See Comments)    Drained, no energy    Family History  Problem Relation Age of Onset   Kidney disease Mother    Diabetes Mother    Kidney cancer Mother    Heart attack Father    Asthma Sister    Anesthesia problems Sister        "Kidney's did not wake up"   Sarcoidosis Sister    Sarcoidosis Niece    Colon cancer Neg Hx    Colon polyps Neg Hx    Rectal cancer Neg Hx    Stomach cancer Neg Hx  Reviewed on admission  Prior to Admission medications   Medication Sig Start Date End Date Taking? Authorizing Provider  acetaminophen (TYLENOL) 500 MG tablet Take 1,000 mg by mouth daily as needed for mild pain (pain).    [provider]  albuterol (PROVENTIL) (2.5 MG/3ML) 0.083% nebulizer solution Take 3 mLs (2.5 mg total) by nebulization every 6 (six) hours as needed for wheezing or shortness of breath. Dx Code D86.0 07/24/14   Elsie Stain, MD  Albuterol Sulfate (PROAIR RESPICLICK) 379 (90 BASE) MCG/ACT AEPB Inhale 2 puffs into the lungs every 6 (six) hours as needed. Patient taking differently: Inhale 2 puffs into the lungs as needed (for shortness of breath). 07/24/14   Elsie Stain, MD  aspirin EC 81 MG tablet Take 81 mg by mouth every evening.     [provider]  benzonatate (TESSALON) 200 MG capsule Take 200 mg by mouth as needed for  cough. 01/14/22   [provider]  Cholecalciferol (VITAMIN D3) 3000 UNITS TABS Take 3,000 Units by mouth daily.    [provider]  clopidogrel (PLAVIX) 75 MG tablet Take 1 tablet (75 mg total) by mouth daily. Restart on 09/28/21 09/25/21   Heath Lark D, DO  Coenzyme Q10 (COQ-10) 400 MG CAPS Take 400 mg by mouth 2 (two) times a week. Sunday, Wednesday    [provider]  ezetimibe (ZETIA) 10 MG tablet Take 10 mg by mouth once a week. 06/25/21   [provider]  furosemide (LASIX) 80 MG tablet Take 1 tablet (80 mg total) by mouth daily as needed for edema (take Lasix 80 mg as needed if gains more than 3 pounds in 1 day or 5 pounds in 1 week). Patient taking differently: Take 80 mg by mouth daily. 10/21/21   Antonieta Pert, MD  hydrALAZINE (APRESOLINE) 10 MG tablet Take 1 tablet (10 mg total) by mouth every 8 (eight) hours. 09/16/21   Geradine Girt, DO  insulin aspart protamine- aspart (NOVOLOG MIX 70/30) (70-30) 100 UNIT/ML injection Inject 0.4 mLs (40 Units total) into the skin with breakfast, with lunch, and with evening meal. Patient taking differently: Inject 25-35 Units into the skin with breakfast, with lunch, and with evening meal. 09/16/21   Geradine Girt, DO  Iron, Ferrous Sulfate, 325 (65 Fe) MG TABS Take 325 mg by mouth daily. 01/18/22   Gatha Mayer, MD  levothyroxine (SYNTHROID) 75 MCG tablet Take 75 mcg by mouth daily. 02/24/21   [provider]  metoprolol tartrate (LOPRESSOR) 25 MG tablet Take 0.5 tablets (12.5 mg total) by mouth 2 (two) times daily. 02/03/22   Elgie Collard, PA-C  montelukast (SINGULAIR) 10 MG tablet Take 10 mg by mouth daily as needed (allergies).  07/18/18   [provider]  Multiple Vitamin (MULTIVITAMIN WITH MINERALS) TABS tablet Take 1 tablet by mouth daily. 10/08/18   Hongalgi, Lenis Dickinson, MD  nitroGLYCERIN (NITROSTAT) 0.4 MG SL tablet Place 1 tablet (0.4 mg total) under the tongue every 5 (five) minutes as needed  for chest pain. 07/01/20 11/13/21  Imogene Burn, PA-C  OVER THE COUNTER MEDICATION Take 1 tablet by mouth daily. Omega XL    [provider]  oxyCODONE (OXY IR/ROXICODONE) 5 MG immediate release tablet Take 1 tablet (5 mg total) by mouth every 4 (four) hours as needed for severe pain or breakthrough pain. 09/25/21   Nita Sells, MD  potassium chloride (KLOR-CON M) 10 MEQ tablet Take 10 mEq by mouth daily.  [provider]  predniSONE (DELTASONE) 10 MG tablet Take PO 4 tabs daily x 2 days,3 tabs daily x 2 days,2 tabs daily x 2 days,then resume home dose 1 tab daily 10/21/21   Antonieta Pert, MD  RABEprazole (ACIPHEX) 20 MG tablet Take 20 mg by mouth daily. 09/28/21   [provider]  rosuvastatin (CRESTOR) 20 MG tablet Take 1 tablet (20 mg total) by mouth at bedtime. 02/10/22   Elgie Collard, PA-C    Physical Exam: Vitals:   03/15/22 1530 03/15/22 1600 03/15/22 1645 03/15/22 1745  BP: (!) 93/46 (!) 103/53 (!) 97/56 104/69  Pulse: 87 86 78 82  Resp: 18 (!) 24 (!) 24 (!) 24  Temp:      TempSrc:      SpO2: 92% 98% 96% 95%  Weight:      Height:        Physical Exam Constitutional:      General: He is not in acute distress.    Appearance: Normal appearance. He is obese.  HENT:     Head: Normocephalic and atraumatic.     Mouth/Throat:     Mouth: Mucous membranes are moist.     Pharynx: Oropharynx is clear.  Eyes:     Extraocular Movements: Extraocular movements intact.     Pupils: Pupils are equal, round, and reactive to light.  Cardiovascular:     Rate and Rhythm: Normal rate and regular rhythm.     Pulses: Normal pulses.     Heart sounds: Normal heart sounds.  Pulmonary:     Effort: Pulmonary effort is normal. No respiratory distress.     Breath sounds: Normal breath sounds.  Abdominal:     General: Bowel sounds are normal. There is no distension.     Palpations: Abdomen is soft.     Tenderness: There is no abdominal tenderness.   Musculoskeletal:        General: No swelling or deformity.  Skin:    General: Skin is warm and dry.     Comments: Cellulitic changes left lower extremity.  Neurological:     General: No focal deficit present.     Mental Status: Mental status is at baseline.     Labs on Admission: I have personally reviewed following labs and imaging studies  CBC: Recent Labs  Lab 03/15/22 1225  WBC 34.3*  NEUTROABS 30.8*  HGB 14.0  HCT 45.8  MCV 91.6  PLT 397    Basic Metabolic Panel: Recent Labs  Lab 03/15/22 1225  NA 141  K 2.2*  CL 98  CO2 31  GLUCOSE 102*  BUN 23  CREATININE 1.51*  CALCIUM 9.1    GFR: Estimated Creatinine Clearance: 48.7 mL/min (A) (by C-G formula based on SCr of 1.51 mg/dL (H)).  Liver Function Tests: Recent Labs  Lab 03/15/22 1225  AST 48*  ALT 38  ALKPHOS 60  BILITOT 1.0  PROT 6.7  ALBUMIN 3.1*    Urine analysis:    Component Value Date/Time   COLORURINE YELLOW 03/15/2022 1440   APPEARANCEUR CLEAR 03/15/2022 1440   LABSPEC 1.014 03/15/2022 1440   PHURINE 5.0 03/15/2022 1440   GLUCOSEU NEGATIVE 03/15/2022 1440   HGBUR MODERATE (A) 03/15/2022 1440   BILIRUBINUR NEGATIVE 03/15/2022 1440   KETONESUR NEGATIVE 03/15/2022 1440   PROTEINUR 100 (A) 03/15/2022 1440   UROBILINOGEN 0.2 11/09/2010 2146   NITRITE NEGATIVE 03/15/2022 1440   LEUKOCYTESUR NEGATIVE 03/15/2022 1440    Radiological Exams on Admission: CT CHEST ABDOMEN PELVIS W  CONTRAST  Result Date: 03/15/2022 CLINICAL DATA:  Sepsis.  Emesis.  Weakness. EXAM: CT CHEST, ABDOMEN, AND PELVIS WITH CONTRAST TECHNIQUE: Multidetector CT imaging of the chest, abdomen and pelvis was performed following the standard protocol during bolus administration of intravenous contrast. RADIATION DOSE REDUCTION: This exam was performed according to the departmental dose-optimization program which includes automated exposure control, adjustment of the mA and/or kV according to patient size and/or use of  iterative reconstruction technique. CONTRAST:  23m OMNIPAQUE IOHEXOL 350 MG/ML SOLN COMPARISON:  CT scans from 10/19/2021 and 10/20/2021 FINDINGS: CT CHEST FINDINGS Cardiovascular: Coronary, aortic arch, and branch vessel atherosclerotic vascular disease. Mild cardiomegaly. Mediastinum/Nodes: Small calcified lymph nodes compatible with old granulomatous disease. Bilateral gynecomastia Lungs/Pleura: Trace right pleural effusion. Airway thickening is present, suggesting bronchitis or reactive airways disease. Medial right lower lobe subpleural nodule 7 by 4 mm on image 82 series 5, previously the same on 05/06/2019, considered benign. Mild atelectasis in the posterior basal segments of the lower lobes, right greater than left. The 4 mm subpleural nodule in the right upper lobe on image 77 of series 5, no change from 05/06/2019, considered benign. Mild scarring in the right middle lobe. There is some faint right upper lobe perihilar ground-glass density on image 72 series 5, not present on 10/20/2021, probably from mild alveolitis. Musculoskeletal: Old healed right lower rib fracture. CT ABDOMEN PELVIS FINDINGS Hepatobiliary: Cholecystectomy.  Otherwise unremarkable. Pancreas: Unremarkable Spleen: Unremarkable Adrenals/Urinary Tract: No significant findings. Stomach/Bowel: Unremarkable Vascular/Lymphatic: Atherosclerosis is present, including aortoiliac atherosclerotic disease. Atheromatous plaque at the origin of the celiac trunk and SMA, without occlusion. Reproductive: Borderline prostatomegaly. Other: No supplemental non-categorized findings. Musculoskeletal: Fatty left spermatic cord. Posterolateral rod and pedicle screw fixation at L4-5 bilaterally. IMPRESSION: 1. A specific cause for the patient's sepsis is not identified. 2. Faint right upper lobe perihilar ground-glass density, probably from mild alveolitis. 3. Airway thickening is present, suggesting bronchitis or reactive airways disease. 4. Trace right  pleural effusion. 5. Old healed right lower rib fracture. 6. Old granulomatous disease. 7. Mild cardiomegaly. 8. Atheromatous plaque at the origin of the celiac trunk and SMA, without occlusion. 9. Borderline prostatomegaly. 10. Fatty left spermatic cord. 11. Aortic and coronary atherosclerosis. Aortic Atherosclerosis (ICD10-I70.0). Electronically Signed   By: WVan ClinesM.D.   On: 03/15/2022 15:48   DG Chest Port 1 View  Result Date: 03/15/2022 CLINICAL DATA:  One-month history of with acute onset emesis and left-sided chest pain EXAM: PORTABLE CHEST 1 VIEW COMPARISON:  Chest radiograph dated 02/04/2022 FINDINGS: Normal lung volumes. Bibasilar patchy opacities. No pleural effusion or pneumothorax. Similar enlarged cardiomediastinal silhouette. The visualized skeletal structures are unremarkable. Right upper quadrant surgical clips. IMPRESSION: Bibasilar patchy opacities, which may represent atelectasis, aspiration, or pneumonia. Electronically Signed   By: LDarrin NipperM.D.   On: 03/15/2022 12:40    EKG: Independently reviewed.  Sinus versus ectopic tachycardia at 112 bpm.  PVCs noted.  Significant baseline artifact.  Assessment/Plan Principal Problem:   Sepsis ([place] Active Problems:   Sarcoidosis   Diabetes mellitus without complication (HCC)   GERD   OSA (obstructive sleep apnea)   History of adrenal insufficiency   Coronary atherosclerosis of native coronary artery   Chronic diastolic heart failure (HCC)   Obesity   Pulmonary sarcoidosis (HCC)   Hyperlipidemia   Normocytic anemia   Stage 3a chronic kidney disease (CKD) (HCC)   Hypothyroidism   Peripheral arterial disease (HCC)   HTN (hypertension)   Sepsis > Patient presenting with sepsis given  fever to 103.4, tachycardia, tachypnea, leukocytosis of 34.3. > Source unknown at this time but pulmonary source is possible. > Chest x-ray showed some basilar opacities but this was not demonstrated on CT.  CT showed only slight right  upper lobe groundglass opacities and airway thickening.  CT of the abdomen pelvis did not demonstrate a source either. > Urinalysis with hemoglobin and protein only.  Urine culture and blood culture pending.  No meningismus. > Does have skin changes consistent with left lower extremity cellulitis, as presentation appears out of proportion with the degree of cellulitis. > Started on vancomycin and cefepime in the ED.  Also received dose of Solu-Cortef due to history of adrenal insufficiency in addition to a liter of IV fluids. > Patient was screened to see if viral component could be contributing was negative for flu COVID and RSV, will add on full RVP given no clear infectious source has been identified thus far, though cellulitis is main contender at this point. - Monitor in progressive unit - Continue with IV fluids, but gentle (primarily carrier fluids for his potassium) given history of diastolic CHF - Continue with vancomycin and cefepime - Follow-up urine culture, blood culture - Procalcitonin - Trend fever curve and WBC  AKI on CKD 3a  Hypokalemia > Creatinine elevated to 1.51 from baseline 1.2 in the ED.  Potassium also noted to be 2.2. > Has received a liter of fluids as above.  Also 40 mill equivalents of IV potassium.  Magnesium pending. - Will add additional 60 mEq of IV potassium for total of 100 mEq - Given history of CHF primary fluid source will be carried fluids overnight as he has received a liter. - Follow-up magnesium - Trend renal function and electrolytes  Chronic diastolic CHF > Last echo was in 2023 with EF of 55-60%, limited echo. - Receiving fluids as above - Holding Lasix as above - I's and O's, daily weights  Diabetes > On 70/30 insulin 25-35 units  - Continue 70/30 at 15 units twice daily - SSI  Hyperlipidemia PAD CAD > Status post coronary artery stenting in October 2022. - Continue home rosuvastatin - Currently on Zetia once a week, not reorder -  Continue home aspirin - Appears to be off Plavix, will confirm with pharmacy - Continue home metoprolol starting tomorrow  Hypertension - Holding home Lasix and hydralazine in the setting of low normal blood pressure and sepsis  Adrenal insufficiency > On chronic prednisone - Received 100 mg IV Solu-Cortef in the ED.  - Will continue with 25 mg of IV Solu-Cortef every 6 hours for now.  OSA - Continue home CPAP  GERD - Continue PPI  History of pulmonary sarcoid - Noted, stable on CT today  Obesity - Noted  DVT prophylaxis: Lovenox Code Status:   Full Family Communication:  None on admission, family is aware of admission and was with him earlier per patient.  Attempted to reach wife, Ivin Booty, by phone, but there was no answer. Disposition Plan:   Patient is from:  Home  Anticipated DC to:  Home  Anticipated DC date:  2 to 5 days  Anticipated DC barriers: None  Consults called:  None Admission status:  Inpatient, progressive  Severity of Illness: The appropriate patient status for this patient is INPATIENT. Inpatient status is judged to be reasonable and necessary in order to provide the required intensity of service to ensure the patient's safety. The patient's presenting symptoms, physical exam findings, and initial radiographic and laboratory data  in the context of their chronic comorbidities is felt to place them at high risk for further clinical deterioration. Furthermore, it is not anticipated that the patient will be medically stable for discharge from the hospital within 2 midnights of admission.   * I certify that at the point of admission it is my clinical judgment that the patient will require inpatient hospital care spanning beyond 2 midnights from the point of admission due to high intensity of service, high risk for further deterioration and high frequency of surveillance required.Marcelyn Bruins MD [facility]  How to contact the [facility] Attending or  Consulting provider [place] or covering provider during after hours [place], for this patient?   Check the care team in [facility] and look for a) attending/consulting [facility] provider listed and b) the [facility] team listed Log into [email or web address] and use 's universal password to access. If you do not have the password, please contact the hospital operator. Locate the [facility] provider you are looking for under [facility] and page to a number that you can be directly reached. If you still have difficulty reaching the provider, please page the [facility] (Director on Call) for the Hospitalists listed on amion for assistance.  03/15/2022, 5:57 PM

## 2022-03-15 NOTE — ED Provider Notes (Addendum)
  Physical Exam  BP (!) 93/46   Pulse 87   Temp (!) 100.5 F (38.1 C) (Oral)   Resp 18   Ht '5\' 10"'$  (1.778 m)   Wt 97.5 kg   SpO2 92%   BMI 30.85 kg/m   Physical Exam  Procedures  Procedures  ED Course / MDM    Medical Decision Making Amount and/or Complexity of Data Reviewed Labs: ordered. Radiology: ordered. ECG/medicine tests: ordered.  Risk OTC drugs. Prescription drug management.   Received patient in signout.  Sepsis.  Some mild hypotension.  Lactic acid mildly elevated.  Potential cellulitis on the left lower extremity but has been treated for pneumonia.  Has been on steroids both chronically and recently.  CT scan done to abdominal pain and overall reassuring.  No clear cause found.  Urinalysis still pending.  However will admit to hospital.  Has had cefepime and vancomycin.  Will give stress dose steroids.  Past Medical History:  Diagnosis Date   Adrenal insufficiency ([place])    Allergy    Anemia    Arthritis    feet    Broken foot 09/2019   had to wore a boot. Left    CKD (chronic kidney disease), stage III (HCC)    Coronary artery disease    has stents   Coronary atherosclerosis of native coronary artery    Proximal LAD, posterior lateral stent widely patent-10/12/11   Diabetes mellitus    insulin and pills   Hearing loss    wears hearing aids   History of blood transfusion 06/30/2016   [facility] - 2 units transfused   Hyperlipidemia    Hypertension    OSA (obstructive sleep apnea)    uses VPAC sleep study 2 years done through [place]. Dr. Marlou Porch arranged study   PVD (peripheral vascular disease) ([place])    Sarcoidosis    Sleep apnea    uses CPAP nightly   Thyroid disease    Type 2 diabetes mellitus ([place])    Discussed with family member.  The med list does show hydrocortisone as nausea but according to wife it is hydrocodone.     Davonna Belling, MD 03/15/22 1557    Davonna Belling, MD 03/15/22 [phone]

## 2022-03-16 ENCOUNTER — Inpatient Hospital Stay (HOSPITAL_COMMUNITY): Payer: Medicare HMO

## 2022-03-16 DIAGNOSIS — E669 Obesity, unspecified: Secondary | ICD-10-CM | POA: Diagnosis not present

## 2022-03-16 DIAGNOSIS — J189 Pneumonia, unspecified organism: Secondary | ICD-10-CM

## 2022-03-16 DIAGNOSIS — E1165 Type 2 diabetes mellitus with hyperglycemia: Secondary | ICD-10-CM

## 2022-03-16 DIAGNOSIS — N179 Acute kidney failure, unspecified: Secondary | ICD-10-CM | POA: Diagnosis not present

## 2022-03-16 DIAGNOSIS — Z6830 Body mass index (BMI) 30.0-30.9, adult: Secondary | ICD-10-CM

## 2022-03-16 DIAGNOSIS — I5032 Chronic diastolic (congestive) heart failure: Secondary | ICD-10-CM | POA: Diagnosis not present

## 2022-03-16 DIAGNOSIS — A419 Sepsis, unspecified organism: Secondary | ICD-10-CM | POA: Diagnosis not present

## 2022-03-16 DIAGNOSIS — G4733 Obstructive sleep apnea (adult) (pediatric): Secondary | ICD-10-CM | POA: Diagnosis not present

## 2022-03-16 DIAGNOSIS — L03116 Cellulitis of left lower limb: Secondary | ICD-10-CM | POA: Diagnosis not present

## 2022-03-16 DIAGNOSIS — D860 Sarcoidosis of lung: Secondary | ICD-10-CM | POA: Diagnosis not present

## 2022-03-16 DIAGNOSIS — R6520 Severe sepsis without septic shock: Secondary | ICD-10-CM

## 2022-03-16 DIAGNOSIS — D649 Anemia, unspecified: Secondary | ICD-10-CM

## 2022-03-16 DIAGNOSIS — M7989 Other specified soft tissue disorders: Secondary | ICD-10-CM | POA: Diagnosis not present

## 2022-03-16 DIAGNOSIS — Z794 Long term (current) use of insulin: Secondary | ICD-10-CM

## 2022-03-16 LAB — RESPIRATORY PANEL BY PCR

## 2022-03-16 LAB — COMPREHENSIVE METABOLIC PANEL
ALT: 38 U/L (ref 0–44)
AST: 46 U/L — ABNORMAL HIGH (ref 15–41)
Albumin: 2.4 g/dL — ABNORMAL LOW (ref 3.5–5.0)
Alkaline Phosphatase: 54 U/L (ref 38–126)
Anion gap: 12 (ref 5–15)
BUN: 27 mg/dL — ABNORMAL HIGH (ref 8–23)
CO2: 25 mmol/L (ref 22–32)
Calcium: 7.8 mg/dL — ABNORMAL LOW (ref 8.9–10.3)
Chloride: 97 mmol/L — ABNORMAL LOW (ref 98–111)
Creatinine, Ser: 1.51 mg/dL — ABNORMAL HIGH (ref 0.61–1.24)
GFR, Estimated: 48 mL/min — ABNORMAL LOW (ref >60)
Glucose, Bld: 313 mg/dL — ABNORMAL HIGH (ref 70–99)
Potassium: 4 mmol/L (ref 3.5–5.1)
Sodium: 134 mmol/L — ABNORMAL LOW (ref 135–145)
Total Bilirubin: 0.7 mg/dL (ref 0.3–1.2)
Total Protein: 5.5 g/dL — ABNORMAL LOW (ref 6.5–8.1)

## 2022-03-16 LAB — PROTIME-INR
INR: 1.3 — ABNORMAL HIGH (ref 0.8–1.2)
Prothrombin Time: 16.4 seconds — ABNORMAL HIGH (ref 11.4–15.2)

## 2022-03-16 LAB — CBC
HCT: 38.7 % — ABNORMAL LOW (ref 39.0–52.0)
Hemoglobin: 12.1 g/dL — ABNORMAL LOW (ref 13.0–17.0)
MCH: 28.8 pg (ref 26.0–34.0)
MCHC: 31.3 g/dL (ref 30.0–36.0)
MCV: 92.1 fL (ref 80.0–100.0)
Platelets: 243 10*3/uL (ref 150–400)
RBC: 4.2 MIL/uL — ABNORMAL LOW (ref 4.22–5.81)
RDW: 19.6 % — ABNORMAL HIGH (ref 11.5–15.5)
WBC: 32.2 10*3/uL — ABNORMAL HIGH (ref 4.0–10.5)
nRBC: 0 % (ref 0.0–0.2)

## 2022-03-16 LAB — GLUCOSE, CAPILLARY
Glucose-Capillary: 205 mg/dL — ABNORMAL HIGH (ref 70–99)
Glucose-Capillary: 194 mg/dL — ABNORMAL HIGH (ref 70–99)

## 2022-03-16 LAB — URINE CULTURE

## 2022-03-16 LAB — CBG MONITORING, ED
Glucose-Capillary: 318 mg/dL — ABNORMAL HIGH (ref 70–99)
Glucose-Capillary: 313 mg/dL — ABNORMAL HIGH (ref 70–99)

## 2022-03-16 LAB — CK: Total CK: 151 U/L (ref 49–397)

## 2022-03-16 LAB — PROCALCITONIN: Procalcitonin: 31.56 ng/mL

## 2022-03-16 LAB — CORTISOL-AM, BLOOD: Cortisol - AM: 58.3 ug/dL — ABNORMAL HIGH (ref 6.7–22.6)

## 2022-03-16 LAB — MAGNESIUM: Magnesium: 2 mg/dL (ref 1.7–2.4)

## 2022-03-16 LAB — BRAIN NATRIURETIC PEPTIDE: B Natriuretic Peptide: 1096.7 pg/mL — ABNORMAL HIGH (ref 0.0–100.0)

## 2022-03-16 LAB — SEDIMENTATION RATE: Sed Rate: 40 mm/hr — ABNORMAL HIGH (ref 0–16)

## 2022-03-16 LAB — C-REACTIVE PROTEIN: CRP: 23.9 mg/dL — ABNORMAL HIGH (ref <1.0)

## 2022-03-16 MED ORDER — HYDROCORTISONE SOD SUC (PF) 100 MG IJ SOLR
50.0000 mg | Freq: Two times a day (BID) | INTRAMUSCULAR | Status: DC
  Administered 2022-03-16: 50 mg via INTRAVENOUS
  Filled 2022-03-16 (×2): qty 1

## 2022-03-16 MED ORDER — INSULIN GLARGINE-YFGN 100 UNIT/ML ~~LOC~~ SOLN
15.0000 [IU] | Freq: Two times a day (BID) | SUBCUTANEOUS | Status: DC
  Administered 2022-03-16 – 2022-03-17 (×2): 15 [IU] via SUBCUTANEOUS
  Filled 2022-03-16 (×3): qty 0.15

## 2022-03-16 MED ORDER — INSULIN ASPART 100 UNIT/ML IJ SOLN
6.0000 [IU] | Freq: Three times a day (TID) | INTRAMUSCULAR | Status: DC
  Administered 2022-03-16 – 2022-03-18 (×8): 6 [IU] via SUBCUTANEOUS

## 2022-03-16 MED ORDER — ASPIRIN 81 MG PO TBEC
81.0000 mg | DELAYED_RELEASE_TABLET | Freq: Every evening | ORAL | Status: DC
  Administered 2022-03-16 – 2022-03-20 (×5): 81 mg via ORAL
  Filled 2022-03-16 (×5): qty 1

## 2022-03-16 MED ORDER — INSULIN GLARGINE-YFGN 100 UNIT/ML ~~LOC~~ SOLN: 20.0000 [IU] | Freq: Every day | SUBCUTANEOUS | Status: DC

## 2022-03-16 MED ORDER — MONTELUKAST SODIUM 10 MG PO TABS: 10.0000 mg | ORAL_TABLET | Freq: Every day | ORAL | Status: DC | PRN

## 2022-03-16 MED ORDER — ONDANSETRON HCL 4 MG/2ML IJ SOLN
4.0000 mg | Freq: Four times a day (QID) | INTRAMUSCULAR | Status: DC | PRN
  Administered 2022-03-16: 4 mg via INTRAVENOUS
  Filled 2022-03-16: qty 2

## 2022-03-16 NOTE — Progress Notes (Signed)
PROGRESS NOTE  ANANTH FIALLOS EXN:[id number] DOB: November 07, 1945   PCP: Shirline Frees, MD  Patient is from: Home.  Lives with his wife.  DOA: 03/15/2022 LOS: 1  Chief complaints Chief Complaint  Patient presents with   Weakness     Brief Narrative / Interim history: 77 year old M with PMH of CAD, diastolic CHF, OSA not on CPAP, adrenal insufficiency on prednisone, sarcoidosis, DM-2, CKD-3, PAD, HTN, hypothyroidism, anemia, obesity and GERD presenting with generalized weakness with associated nausea, vomiting, myalgia, fever, productive cough with blood-tinged phlegm and epistaxis for about a week and admitted for severe sepsis in the setting of cellulitis, possible pneumonia and rule out bacteremia.  Recently treated with Z-Pak and steroid by PCP for possible pneumonia.  Patient's wife noted LLE swelling and erythema when EMS arrived.  In ED, febrile to 103.4.  HR 116.  RR 24.  BP normal.  Some brief desaturations to 80s on RA.  WBC 34 with left shift.  K2.2. Cr 1.5 (baseline 1.2).  LA 2.8.  COVID-19, influenza and RSV PCR negative.  CXR with bibasilar patchy opacity.  CT chest/abdomen/pelvis concerning for RUL groundglass and bronchial thickening.  Cultures obtained.  He was started on broad-spectrum antibiotics with vancomycin and cefepime.   Subjective: Seen and examined earlier this morning.  No major events overnight of this morning.  Reports nausea but no emesis.  Last emesis was yesterday.  Emesis was small-volume phlegm without hematemesis.  Had diarrhea about 2 days ago.  Had some nosebleed yesterday.  Still with some productive cough and shortness of breath.  Maintain saturation above 92% on room air when taken off oxygen.   Objective: Vitals:   03/16/22 1200 03/16/22 1230 03/16/22 1259 03/16/22 1300  BP: 103/65 109/60  116/69  Pulse: 63 62  63  Resp: (!) 22 (!) 22  18  Temp:   98.7 F (37.1 C)   TempSrc:      SpO2: 94% 93%  96%  Weight:      Height:         Examination:  GENERAL: No apparent distress.  Nontoxic. HEENT: MMM.  Vision grossly intact.  Hard of hearing. NECK: Supple.  No apparent JVD.  RESP:  No IWOB.  Fair aeration bilaterally.  Crackles at right lung base. CVS:  RRR. Heart sounds normal.  ABD/GI/GU: BS+. Abd soft, NTND.  MSK/EXT:  Moves extremities.  LLE edema with erythema and some increased warmth to touch. SKIN: As above. NEURO: Awake, alert and oriented appropriately.  No apparent focal neuro deficit. PSYCH: Calm. Normal affect.   Procedures:  None  Microbiology summarized: [id number], influenza and RSV PCR nonreactive. Blood cultures NGTD Full RVP pending. Urine culture pending.  Assessment and plan: Principal Problem:   Severe sepsis (HCC) Active Problems:   Sarcoidosis   GERD   OSA (obstructive sleep apnea)   History of adrenal insufficiency   Coronary atherosclerosis of native coronary artery   Chronic diastolic heart failure (HCC)   Obesity   Pulmonary sarcoidosis ([place])   Uncontrolled type 2 diabetes mellitus with hyperglycemia, with long-term current use of insulin (HCC)   Hyperlipidemia   Normocytic anemia   Hypothyroidism   Peripheral arterial disease (HCC)   Acute renal failure superimposed on stage 3a chronic kidney disease (HCC)   HTN (hypertension)   Cellulitis of left lower extremity   Right upper lobe pneumonia  Severe sepsis due to left lower extremity cellulitis and possible RUL pneumonia: POA.  Presents with productive cough with blood-tinged phlegm, nausea,  vomiting, myalgia, fever and chills.  Has significant leukocytosis with tachycardia, tachypnea, lactic acidosis and AKI.  He has acute LLE edema with erythema increased warmth to touch.  CXR raises concern for RUL groundglass opacity and bronchitic changes.  Exam with some crackles in right lung.  Blood cultures NGTD.  Pro-Cal elevated to 32.  WBC remains elevated at 32.  Patient is immunocompromised on steroid for adrenal  insufficiency.  Lactic acidosis resolved. -Continue broad-spectrum antibiotics with vancomycin and cefepime. -Doubt utility for and typical coverage as he recently completed Z-Pak -Change Solu-Cortef from 25 mg 4 times daily to 50 mg twice daily -LE venous Doppler and ABI. -Check left foot and left leg x-ray -Check CRP, ESR and CK -PT/OT eval  AKI on CKD-3A: Likely prerenal in the setting of GI loss and concurrent use of diuretics and acute illness. Recent Labs    10/03/21 0830 10/04/21 0141 10/05/21 0553 10/06/21 0522 10/19/21 1717 10/20/21 0411 10/21/21 0519 12/28/21 1252 03/15/22 1225 03/16/22 0458  BUN 20 25* 22 20 35* 25* 25* 19 23 27*  CREATININE 1.53* 1.50* 1.19 1.16 1.60* 1.25* 1.10 1.17 1.51* 1.51*  -Hold diuretics or nephrotoxic meds -Monitor intake and output -Continue trending  Chronic diastolic CHF: Appears euvolemic on exam except for LLE edema likely from cellulitis.  Limited TTE in 2023 with LVEF of 55 to 60%.  On Lasix 80 mg as needed. -Check BNP -Continue holding Lasix -Monitor respiratory and fluid status   Uncontrolled IDDM-2 with hyperglycemia and CKD-3A: A1c 10% in 09/2021. Recent Labs  Lab 03/15/22 2147 03/16/22 0853 03/16/22 1300  GLUCAP 236* 318* 313*  -Discontinue insulin 70/30.  Has already received the morning dose. -Continue SSI-moderate -Add NovoLog 6 units 3 times daily with meals -Add Semglee 15 units twice daily starting tonight -Recheck hemoglobin A1c  History of CAD PAD/HLD: S/p 3 DES in November 2022. -Continue home aspirin, metoprolol and statin. -Hold Plavix for now while evaluating LLE cellulitis   Hypertension: Normotensive but soft. -Holding home Lasix and hydralazine in the setting of low normal blood pressure and sepsis -Continue metoprolol   Adrenal insufficiency: On prednisone 10 mg daily at home. Received 100 mg IV Solu-Cortef in the ED.  -Continue Solu-Cortef 50 mg twice daily and wean off as appropriate   OSA not  on CPAP-under evaluation for CPAP. -wanted to try CPAP today.  Hypokalemia/hypomagnesemia -Monitor replenish as appropriate  Generalized weakness/physical deconditioning/chronic back pain/radiculopathy -PT/OT eval  Epistaxis: No active bleeding. -Avoid oxygen as able -Saline nose spray   GERD -Continue PPI   History of pulmonary sarcoid: Stable on CT.   Obesity Body mass index is 30.85 kg/m.          DVT prophylaxis:  enoxaparin (LOVENOX) injection 40 mg Start: 03/15/22 1800  Code Status: Full code-confirmed with patient and wife. Family Communication: Updated patient's wife at bedside Level of care: Med-Surg Status is: Inpatient Remains inpatient appropriate because: Severe sepsis in the setting of LLE cellulitis, pneumonia   Final disposition: TBD Consultants:  None  55 minutes with more than 50% spent in reviewing records, counseling patient/family and coordinating care.   Sch Meds:  Scheduled Meds:  enoxaparin (LOVENOX) injection  40 mg Subcutaneous Q24H   hydrocortisone sodium succinate  25 mg Intravenous Q6H   insulin aspart  0-15 Units Subcutaneous TID WC   insulin aspart protamine- aspart  15 Units Subcutaneous BID WC   insulin glargine-yfgn  20 Units Subcutaneous Daily   levothyroxine  75 mcg Oral Q0600  metoprolol tartrate  12.5 mg Oral BID   pantoprazole  40 mg Oral Daily   rosuvastatin  20 mg Oral QHS   sodium chloride flush  3 mL Intravenous Q12H   Continuous Infusions:  ceFEPime (MAXIPIME) IV Stopped (03/16/22 0159)   vancomycin     PRN Meds:.acetaminophen **OR** acetaminophen, albuterol, ondansetron (ZOFRAN) IV, senna-docusate  Antimicrobials: Anti-infectives (From admission, onward)    Start     Dose/Rate Route Frequency Ordered Stop   03/16/22 1500  vancomycin (VANCOCIN) IVPB 1000 mg/200 mL premix        1,000 mg 200 mL/hr over 60 Minutes Intravenous Every 24 hours 03/15/22 1433 03/22/22 1459   03/16/22 0100  ceFEPIme (MAXIPIME) 2  g in sodium chloride 0.9 % 100 mL IVPB        2 g 200 mL/hr over 30 Minutes Intravenous Every 12 hours 03/15/22 1433 03/22/22 0059   03/15/22 1445  vancomycin (VANCOCIN) IVPB 1000 mg/200 mL premix        1,000 mg 200 mL/hr over 60 Minutes Intravenous Every hour 03/15/22 1436 03/15/22 1643   03/15/22 1200  vancomycin (VANCOCIN) IVPB 1000 mg/200 mL premix        1,000 mg 200 mL/hr over 60 Minutes Intravenous Every hour 03/15/22 1148 03/15/22 1359   03/15/22 1145  vancomycin (VANCOCIN) IVPB 1000 mg/200 mL premix  Status:  Discontinued        1,000 mg 200 mL/hr over 60 Minutes Intravenous  Once 03/15/22 1133 03/15/22 1148   03/15/22 1145  ceFEPIme (MAXIPIME) 2 g in sodium chloride 0.9 % 100 mL IVPB        2 g 200 mL/hr over 30 Minutes Intravenous  Once 03/15/22 1133 03/15/22 1453        I have personally reviewed the following labs and images: CBC: Recent Labs  Lab 03/15/22 1225 03/16/22 0458  WBC 34.3* 32.2*  NEUTROABS 30.8*  --   HGB 14.0 12.1*  HCT 45.8 38.7*  MCV 91.6 92.1  PLT 305 243   BMP &GFR Recent Labs  Lab 03/15/22 1225 03/15/22 1700 03/15/22 1930 03/16/22 0458  NA 141  --   --  134*  K 2.2*  --  3.7 4.0  CL 98  --   --  97*  CO2 31  --   --  25  GLUCOSE 102*  --   --  313*  BUN 23  --   --  27*  CREATININE 1.51*  --   --  1.51*  CALCIUM 9.1  --   --  7.8*  MG  --  1.6*  --   --    Estimated Creatinine Clearance: 48.7 mL/min (A) (by C-G formula based on SCr of 1.51 mg/dL (H)). Liver & Pancreas: Recent Labs  Lab 03/15/22 1225 03/16/22 0458  AST 48* 46*  ALT 38 38  ALKPHOS 60 54  BILITOT 1.0 0.7  PROT 6.7 5.5*  ALBUMIN 3.1* 2.4*   No results for input(s): "LIPASE", "AMYLASE" in the last 168 hours. No results for input(s): "AMMONIA" in the last 168 hours. Diabetic: No results for input(s): "HGBA1C" in the last 72 hours. Recent Labs  Lab 03/15/22 2147 03/16/22 0853 03/16/22 1300  GLUCAP 236* 318* 313*   Cardiac Enzymes: No results for  input(s): "CKTOTAL", "CKMB", "CKMBINDEX", "TROPONINI" in the last 168 hours. No results for input(s): "PROBNP" in the last 8760 hours. Coagulation Profile: Recent Labs  Lab 03/15/22 1225 03/16/22 0458  INR 1.1 1.3*   Thyroid Function Tests:  No results for input(s): "TSH", "T4TOTAL", "FREET4", "T3FREE", "THYROIDAB" in the last 72 hours. Lipid Profile: No results for input(s): "CHOL", "HDL", "LDLCALC", "TRIG", "CHOLHDL", "LDLDIRECT" in the last 72 hours. Anemia Panel: No results for input(s): "VITAMINB12", "FOLATE", "FERRITIN", "TIBC", "IRON", "RETICCTPCT" in the last 72 hours. Urine analysis:    Component Value Date/Time   COLORURINE YELLOW 03/15/2022 1440   APPEARANCEUR CLEAR 03/15/2022 1440   LABSPEC 1.014 03/15/2022 1440   PHURINE 5.0 03/15/2022 1440   GLUCOSEU NEGATIVE 03/15/2022 1440   HGBUR MODERATE (A) 03/15/2022 1440   BILIRUBINUR NEGATIVE 03/15/2022 1440   KETONESUR NEGATIVE 03/15/2022 1440   PROTEINUR 100 (A) 03/15/2022 1440   UROBILINOGEN 0.2 11/09/2010 2146   NITRITE NEGATIVE 03/15/2022 1440   LEUKOCYTESUR NEGATIVE 03/15/2022 1440   Sepsis Labs: Invalid input(s): "PROCALCITONIN", "LACTICIDVEN"  Microbiology: Recent Results (from the past 240 hour(s))  Resp panel by RT-PCR (RSV, Flu A&B, Covid) Anterior Nasal Swab     Status: None   Collection Time: 03/15/22 11:33 AM   Specimen: Anterior Nasal Swab  Result Value Ref Range Status   SARS Coronavirus 2 by RT PCR NEGATIVE NEGATIVE Final    Comment: (NOTE) SARS-CoV-2 target nucleic acids are NOT DETECTED.  The SARS-CoV-2 RNA is generally detectable in upper respiratory specimens during the acute phase of infection. The lowest concentration of SARS-CoV-2 viral copies this assay can detect is 138 copies/mL. A negative result does not preclude SARS-Cov-2 infection and should not be used as the sole basis for treatment or other patient management decisions. A negative result may occur with  improper specimen  collection/handling, submission of specimen other than nasopharyngeal swab, presence of viral mutation(s) within the areas targeted by this assay, and inadequate number of viral copies(<138 copies/mL). A negative result must be combined with clinical observations, patient history, and epidemiological information. The expected result is Negative.  Fact Sheet for Patients:  EntrepreneurPulse.com.au  Fact Sheet for Healthcare Providers:  IncredibleEmployment.be  This test is no t yet approved or cleared by the [place] FDA and  has been authorized for detection and/or diagnosis of SARS-CoV-2 by FDA under an Emergency Use Authorization (EUA). This EUA will remain  in effect (meaning this test can be used) for the duration of the COVID-19 declaration under Section 564(b)(1) of the Act, 21 U.S.C.section 360bbb-3(b)(1), unless the authorization is terminated  or revoked sooner.       Influenza A by PCR NEGATIVE NEGATIVE Final   Influenza B by PCR NEGATIVE NEGATIVE Final    Comment: (NOTE) The Xpert Xpress SARS-CoV-2/FLU/RSV plus assay is intended as an aid in the diagnosis of influenza from Nasopharyngeal swab specimens and should not be used as a sole basis for treatment. Nasal washings and aspirates are unacceptable for Xpert Xpress SARS-CoV-2/FLU/RSV testing.  Fact Sheet for Patients: EntrepreneurPulse.com.au  Fact Sheet for Healthcare Providers: IncredibleEmployment.be  This test is not yet approved or cleared by the [place] FDA and has been authorized for detection and/or diagnosis of SARS-CoV-2 by FDA under an Emergency Use Authorization (EUA). This EUA will remain in effect (meaning this test can be used) for the duration of the COVID-19 declaration under Section 564(b)(1) of the Act, 21 U.S.C. section 360bbb-3(b)(1), unless the authorization is terminated or revoked.     Resp Syncytial  Virus by PCR NEGATIVE NEGATIVE Final    Comment: (NOTE) Fact Sheet for Patients: EntrepreneurPulse.com.au  Fact Sheet for Healthcare Providers: IncredibleEmployment.be  This test is not yet approved or cleared by the [place] FDA and has  been authorized for detection and/or diagnosis of SARS-CoV-2 by FDA under an Emergency Use Authorization (EUA). This EUA will remain in effect (meaning this test can be used) for the duration of the COVID-19 declaration under Section 564(b)(1) of the Act, 21 U.S.C. section 360bbb-3(b)(1), unless the authorization is terminated or revoked.  Performed at [facility], [place] [address] [place], [place]  [address]   Blood Culture (routine x 2)     Status: None (Preliminary result)   Collection Time: 03/15/22 12:25 PM   Specimen: BLOOD LEFT FOREARM  Result Value Ref Range Status   Specimen Description BLOOD LEFT FOREARM  Final   Special Requests   Final    BOTTLES DRAWN AEROBIC AND ANAEROBIC Blood Culture results may not be optimal due to an inadequate volume of blood received in culture bottles   Culture   Final    NO GROWTH < 24 HOURS Performed at [facility], [place] [address], [place], [place] [address]    Report Status PENDING  Incomplete  Blood Culture (routine x 2)     Status: None (Preliminary result)   Collection Time: 03/15/22 12:25 PM   Specimen: BLOOD RIGHT FOREARM  Result Value Ref Range Status   Specimen Description BLOOD RIGHT FOREARM  Final   Special Requests   Final    BOTTLES DRAWN AEROBIC AND ANAEROBIC Blood Culture results may not be optimal due to an inadequate volume of blood received in culture bottles   Culture   Final    NO GROWTH < 24 HOURS Performed at [facility], [place] [address], [place], [place] [address]    Report Status PENDING  Incomplete    Radiology Studies: CT CHEST ABDOMEN PELVIS W CONTRAST  Result Date: 03/15/2022 CLINICAL DATA:  Sepsis.  Emesis.   Weakness. EXAM: CT CHEST, ABDOMEN, AND PELVIS WITH CONTRAST TECHNIQUE: Multidetector CT imaging of the chest, abdomen and pelvis was performed following the standard protocol during bolus administration of intravenous contrast. RADIATION DOSE REDUCTION: This exam was performed according to the departmental dose-optimization program which includes automated exposure control, adjustment of the mA and/or kV according to patient size and/or use of iterative reconstruction technique. CONTRAST:  40m OMNIPAQUE IOHEXOL 350 MG/ML SOLN COMPARISON:  CT scans from 10/19/2021 and 10/20/2021 FINDINGS: CT CHEST FINDINGS Cardiovascular: Coronary, aortic arch, and branch vessel atherosclerotic vascular disease. Mild cardiomegaly. Mediastinum/Nodes: Small calcified lymph nodes compatible with old granulomatous disease. Bilateral gynecomastia Lungs/Pleura: Trace right pleural effusion. Airway thickening is present, suggesting bronchitis or reactive airways disease. Medial right lower lobe subpleural nodule 7 by 4 mm on image 82 series 5, previously the same on 05/06/2019, considered benign. Mild atelectasis in the posterior basal segments of the lower lobes, right greater than left. The 4 mm subpleural nodule in the right upper lobe on image 77 of series 5, no change from 05/06/2019, considered benign. Mild scarring in the right middle lobe. There is some faint right upper lobe perihilar ground-glass density on image 72 series 5, not present on 10/20/2021, probably from mild alveolitis. Musculoskeletal: Old healed right lower rib fracture. CT ABDOMEN PELVIS FINDINGS Hepatobiliary: Cholecystectomy.  Otherwise unremarkable. Pancreas: Unremarkable Spleen: Unremarkable Adrenals/Urinary Tract: No significant findings. Stomach/Bowel: Unremarkable Vascular/Lymphatic: Atherosclerosis is present, including aortoiliac atherosclerotic disease. Atheromatous plaque at the origin of the celiac trunk and SMA, without occlusion. Reproductive:  Borderline prostatomegaly. Other: No supplemental non-categorized findings. Musculoskeletal: Fatty left spermatic cord. Posterolateral rod and pedicle screw fixation at L4-5 bilaterally. IMPRESSION: 1. A specific cause for  the patient's sepsis is not identified. 2. Faint right upper lobe perihilar ground-glass density, probably from mild alveolitis. 3. Airway thickening is present, suggesting bronchitis or reactive airways disease. 4. Trace right pleural effusion. 5. Old healed right lower rib fracture. 6. Old granulomatous disease. 7. Mild cardiomegaly. 8. Atheromatous plaque at the origin of the celiac trunk and SMA, without occlusion. 9. Borderline prostatomegaly. 10. Fatty left spermatic cord. 11. Aortic and coronary atherosclerosis. Aortic Atherosclerosis (ICD10-I70.0). Electronically Signed   By: Van Clines M.D.   On: 03/15/2022 15:48      Alasha Mcguinness T. [place]  If 7PM-7AM, please contact night-coverage [email or web address] 03/16/2022, 1:02 PM

## 2022-03-16 NOTE — ED Notes (Signed)
ED TO INPATIENT HANDOFF REPORT  ED Nurse Name and Phone #: Sherryll Burger [phone]  S Name/Age/Gender [name] 77 y.o. male Room/Bed: 036C/036C  Code Status   Code Status: Full Code  Home/SNF/Other Home Patient oriented to: self, place, time, and situation Is this baseline? Yes   Triage Complete: Triage complete  Chief Complaint Sepsis ([place]) [A41.9] Severe sepsis ([place]) [A41.9, R65.20]  Triage Note Pt BIB [facility] from home c/o being sick x1 month. Pt woke up at 3am this morning stating he felt terrible and started having emesis episodes. Pt is also c/o generalized weakness and was unable to get him out of bed. Right before EMS arrived pt began having left sided CP described as a tightness with no pain radiation. Pt does endorse SHOB and hematemesis.  Pt did get 1 nitro with EMS.    Allergies Allergies  Allergen Reactions   Statins Other (See Comments)    Aggression  Pt ok to take crestor '20mg'$  once a week   Hydrocortisone Nausea Only   Ranexa [Ranolazine] Other (See Comments)    Severe weakness/near syncope after 1 dose Hallucinations    Hydrocodone Nausea And Vomiting   Miralax [Polyethylene Glycol] Nausea And Vomiting   Z-Pak [Azithromycin] Other (See Comments)    Raises blood sugar   Zaroxolyn [Metolazone] Other (See Comments)    Drained, no energy    Level of Care/Admitting Diagnosis ED Disposition     ED Disposition  Admit   Condition  --   [place]: [facility] [[id number]]  Level of Care: Med-Surg [16]  May admit patient to [facility] or [facility] if equivalent level of care is available:: No  Covid Evaluation: Confirmed COVID Negative  Diagnosis: Severe sepsis [facility]) [[id number]]  Admitting Physician: Mercy Riding [[id number]]  Attending Physician: Mercy Riding [[id number]]  Certification:: I certify this patient will need inpatient services for at least 2 midnights          B Medical/Surgery History Past Medical History:   Diagnosis Date   Adrenal insufficiency ([place])    Allergy    Anemia    Arthritis    feet    Broken foot 09/2019   had to wore a boot. Left    CKD (chronic kidney disease), stage III (HCC)    Coronary artery disease    has stents   Coronary atherosclerosis of native coronary artery    Proximal LAD, posterior lateral stent widely patent-10/12/11   Diabetes mellitus    insulin and pills   Hearing loss    wears hearing aids   History of blood transfusion 06/30/2016   [facility] - 2 units transfused   Hyperlipidemia    Hypertension    OSA (obstructive sleep apnea)    uses VPAC sleep study 2 years done through [facility]. Dr. Marlou Porch arranged study   PVD (peripheral vascular disease) ([place])    Sarcoidosis    Sleep apnea    uses CPAP nightly   Thyroid disease    Type 2 diabetes mellitus ([place])    Past Surgical History:  Procedure Laterality Date   APPENDECTOMY     CATARACT EXTRACTION  2011   bilat   CHOLECYSTECTOMY  01/25/2011   Procedure: LAPAROSCOPIC CHOLECYSTECTOMY WITH INTRAOPERATIVE CHOLANGIOGRAM;  Surgeon: Judieth Keens, DO;  Location: [facility];  Service: General;  Laterality: N/A;   COLONOSCOPY     several   COLONOSCOPY WITH PROPOFOL N/A 01/03/2020   Procedure: COLONOSCOPY WITH PROPOFOL;  Surgeon:  Gatha Mayer, MD;  Location[facility];  Service: Endoscopy;  Laterality: N/A;   CORONARY ANGIOPLASTY     most recent 11/2009   CORONARY STENT INTERVENTION N/A 08/07/2018   Procedure: CORONARY STENT INTERVENTION;  Surgeon: Jettie Booze, MD;  Location: [facility];  Service: Cardiovascular;  Laterality: N/A;   CORONARY STENT INTERVENTION N/A 01/18/2021   Procedure: CORONARY STENT INTERVENTION;  Surgeon: Sherren Mocha, MD;  Location: [facility];  Service: Cardiovascular;  Laterality: N/A;   CORONARY STENT PLACEMENT  2009   in LAD and side branch PTCA   ESOPHAGOGASTRODUODENOSCOPY (EGD) WITH PROPOFOL N/A 01/03/2020   Procedure: ESOPHAGOGASTRODUODENOSCOPY  (EGD) WITH PROPOFOL;  Surgeon: Gatha Mayer, MD;  [facility];  Service: Endoscopy;  Laterality: N/A;   HEMOSTASIS CLIP PLACEMENT  01/03/2020   Procedure: HEMOSTASIS CLIP PLACEMENT;  Surgeon: Gatha Mayer, MD;  [facility];  Service: Endoscopy;;   INTERCOSTAL NERVE BLOCK  2011, 06/2016   x2. lumbar spine   INTRAVASCULAR PRESSURE WIRE/FFR STUDY N/A 01/18/2021   Procedure: INTRAVASCULAR PRESSURE WIRE/FFR STUDY;  Surgeon: Sherren Mocha, MD;  Location: [facility];  Service: Cardiovascular;  Laterality: N/A;   LEFT HEART CATHETERIZATION WITH CORONARY ANGIOGRAM Bilateral 10/12/2011   Procedure: LEFT HEART CATHETERIZATION WITH CORONARY ANGIOGRAM;  Surgeon: Candee Furbish, MD;  Location: [facility] C[facility];  Service: Cardiovascular;  Laterality: Bilateral;   LEFT HEART CATHETERIZATION WITH CORONARY ANGIOGRAM N/A 04/01/2014   Procedure: LEFT HEART CATHETERIZATION WITH CORONARY ANGIOGRAM;  Surgeon: Candee Furbish, MD;  Location: [facility];  Service: Cardiovascular;  Laterality: N/A;   POLYPECTOMY  01/03/2020   Procedure: POLYPECTOMY;  Surgeon: Gatha Mayer, MD;  [facility];  Service: Endoscopy;;   RIGHT/LEFT HEART CATH AND CORONARY ANGIOGRAPHY N/A 08/07/2018   Procedure: RIGHT/LEFT HEART CATH AND CORONARY ANGIOGRAPHY;  Surgeon: Jettie Booze, MD;  Location: [facility];  Service: Cardiovascular;  Laterality: N/A;   RIGHT/LEFT HEART CATH AND CORONARY ANGIOGRAPHY N/A 12/25/2020   Procedure: RIGHT/LEFT HEART CATH AND CORONARY ANGIOGRAPHY;  Surgeon: Belva Crome, MD;  Location: [facility];  Service: Cardiovascular;  Laterality: N/A;   TRANSFORAMINAL LUMBAR INTERBODY FUSION W/ MIS 1 LEVEL N/A 09/24/2021   Procedure: LUMBAR FOUR-FIVE MINIMALLY INVASIVE (MIS) TRANSFORAMINAL LUMBAR INTERBODY FUSION (TLIF) WITH METRX;  Surgeon: Judith Part, MD;  Location: [facility];  Service: Neurosurgery;  Laterality: N/A;     A IV Location/Drains/Wounds Patient  Lines/Drains/Airways Status     Active Line/Drains/Airways     Name Placement date Placement time Site Days   Peripheral IV 03/15/22 20 G Left;Posterior Forearm 03/15/22  1322  Forearm  1   Peripheral IV 03/15/22 20 G Posterior;Right Forearm 03/15/22  1328  Forearm  1   Peripheral IV 03/16/22 18 G 2.5" Anterior;Left;Proximal;Upper Arm 03/16/22  0413  Arm  less than 1            Intake/Output Last 24 hours  Intake/Output Summary (Last 24 hours) at 03/16/2022 1352 Last data filed at 03/16/2022 0159 Gross per 24 hour  Intake 1440.41 ml  Output --  Net 1440.41 ml    Labs/Imaging Results for orders placed or performed during the hospital encounter of 03/15/22 (from the past 48 hour(s))  Resp panel by RT-PCR (RSV, Flu A&B, Covid) Anterior Nasal Swab     Status: None   Collection Time: 03/15/22 11:33 AM   Specimen: Anterior Nasal Swab  Result Value Ref Range   SARS Coronavirus 2 by  RT PCR NEGATIVE NEGATIVE    Comment: (NOTE) SARS-CoV-2 target nucleic acids are NOT DETECTED.  The SARS-CoV-2 RNA is generally detectable in upper respiratory specimens during the acute phase of infection. The lowest concentration of SARS-CoV-2 viral copies this assay can detect is 138 copies/mL. A negative result does not preclude SARS-Cov-2 infection and should not be used as the sole basis for treatment or other patient management decisions. A negative result may occur with  improper specimen collection/handling, submission of specimen other than nasopharyngeal swab, presence of viral mutation(s) within the areas targeted by this assay, and inadequate number of viral copies(<138 copies/mL). A negative result must be combined with clinical observations, patient history, and epidemiological information. The expected result is Negative.  Fact Sheet for Patients:  EntrepreneurPulse.com.au  Fact Sheet for Healthcare Providers:  IncredibleEmployment.be  This test  is no t yet approved or cleared by the [place] FDA and  has been authorized for detection and/or diagnosis of SARS-CoV-2 by FDA under an Emergency Use Authorization (EUA). This EUA will remain  in effect (meaning this test can be used) for the duration of the COVID-19 declaration under Section 564(b)(1) of the Act, 21 U.S.C.section 360bbb-3(b)(1), unless the authorization is terminated  or revoked sooner.       Influenza A by PCR NEGATIVE NEGATIVE   Influenza B by PCR NEGATIVE NEGATIVE    Comment: (NOTE) The Xpert Xpress SARS-CoV-2/FLU/RSV plus assay is intended as an aid in the diagnosis of influenza from Nasopharyngeal swab specimens and should not be used as a sole basis for treatment. Nasal washings and aspirates are unacceptable for Xpert Xpress SARS-CoV-2/FLU/RSV testing.  Fact Sheet for Patients: EntrepreneurPulse.com.au  Fact Sheet for Healthcare Providers: IncredibleEmployment.be  This test is not yet approved or cleared by the [place] FDA and has been authorized for detection and/or diagnosis of SARS-CoV-2 by FDA under an Emergency Use Authorization (EUA). This EUA will remain in effect (meaning this test can be used) for the duration of the COVID-19 declaration under Section 564(b)(1) of the Act, 21 U.S.C. section 360bbb-3(b)(1), unless the authorization is terminated or revoked.     Resp Syncytial Virus by PCR NEGATIVE NEGATIVE    Comment: (NOTE) Fact Sheet for Patients: EntrepreneurPulse.com.au  Fact Sheet for Healthcare Providers: IncredibleEmployment.be  This test is not yet approved or cleared by the [place] FDA and has been authorized for detection and/or diagnosis of SARS-CoV-2 by FDA under an Emergency Use Authorization (EUA). This EUA will remain in effect (meaning this test can be used) for the duration of the COVID-19 declaration under Section 564(b)(1) of the  Act, 21 U.S.C. section 360bbb-3(b)(1), unless the authorization is terminated or revoked.  Performed at [facility], [place] [address], [place], [place] [address]   Urine Culture     Status: Abnormal   Collection Time: 03/15/22 11:33 AM   Specimen: In/Out Cath Urine  Result Value Ref Range   Specimen Description IN/OUT CATH URINE    Special Requests      NONE Performed at [facility], [place] [address], [place], [place] [address]    Culture MULTIPLE SPECIES PRESENT, SUGGEST RECOLLECTION (A)    Report Status 03/16/2022 FINAL   Lactic acid, plasma     Status: Abnormal   Collection Time: 03/15/22 12:25 PM  Result Value Ref Range   Lactic Acid, Venous 2.8 (HH) 0.5 - 1.9 mmol/L    Comment: CRITICAL RESULT CALLED TO, READ BACK BY AND VERIFIED WITH ASHLEY ELLWANGER RN.'@1438'$  ON  1.9.24 BY TCALDWELL MT. Performed at [facility], [place] [address], [place], [place] [address]   Comprehensive metabolic panel     Status: Abnormal   Collection Time: 03/15/22 12:25 PM  Result Value Ref Range   Sodium 141 135 - 145 mmol/L   Potassium 2.2 (LL) 3.5 - 5.1 mmol/L    Comment: CRITICAL RESULT CALLED TO, READ BACK BY AND VERIFIED WITH KAREN COBB RN.'@1409'$  ON 1.9.24 BY TCALDWELL MT.   Chloride 98 98 - 111 mmol/L   CO2 31 22 - 32 mmol/L   Glucose, Bld 102 (H) 70 - 99 mg/dL    Comment: Glucose reference range applies only to samples taken after fasting for at least 8 hours.   BUN 23 8 - 23 mg/dL   Creatinine, Ser 1.51 (H) 0.61 - 1.24 mg/dL   Calcium 9.1 8.9 - 10.3 mg/dL   Total Protein 6.7 6.5 - 8.1 g/dL   Albumin 3.1 (L) 3.5 - 5.0 g/dL   AST 48 (H) 15 - 41 U/L   ALT 38 0 - 44 U/L   Alkaline Phosphatase 60 38 - 126 U/L   Total Bilirubin 1.0 0.3 - 1.2 mg/dL   GFR, Estimated 48 (L) >60 mL/min    Comment: (NOTE) Calculated using the CKD-EPI Creatinine Equation (2021)    Anion gap 12 5 - 15    Comment: Performed at [facility] [address] [place], [place] [address]  CBC with  Differential     Status: Abnormal   Collection Time: 03/15/22 12:25 PM  Result Value Ref Range   WBC 34.3 (H) 4.0 - 10.5 K/uL   RBC 5.00 4.22 - 5.81 MIL/uL   Hemoglobin 14.0 13.0 - 17.0 g/dL   HCT 45.8 39.0 - 52.0 %   MCV 91.6 80.0 - 100.0 fL   MCH 28.0 26.0 - 34.0 pg   MCHC 30.6 30.0 - 36.0 g/dL   RDW 19.4 (H) 11.5 - 15.5 %   Platelets 305 150 - 400 K/uL   nRBC 0.0 0.0 - 0.2 %   Neutrophils Relative % 90 %   Neutro Abs 30.8 (H) 1.7 - 7.7 K/uL   Lymphocytes Relative 4 %   Lymphs Abs 1.3 0.7 - 4.0 K/uL   Monocytes Relative 4 %   Monocytes Absolute 1.5 (H) 0.1 - 1.0 K/uL   Eosinophils Relative 0 %   Eosinophils Absolute 0.0 0.0 - 0.5 K/uL   Basophils Relative 0 %   Basophils Absolute 0.1 0.0 - 0.1 K/uL   WBC Morphology MILD LEFT SHIFT (1-5% METAS, OCC MYELO, OCC BANDS)    RBC Morphology MORPHOLOGY UNREMARKABLE    Smear Review MORPHOLOGY UNREMARKABLE    Immature Granulocytes 2 %   Abs Immature Granulocytes 0.64 (H) 0.00 - 0.07 K/uL    Comment: Performed at [facility], [place] [address], [place], [place] [address]  Protime-INR     Status: None   Collection Time: 03/15/22 12:25 PM  Result Value Ref Range   Prothrombin Time 14.5 11.4 - 15.2 seconds   INR 1.1 0.8 - 1.2    Comment: (NOTE) INR goal varies based on device and disease states. Performed at [facility], [place] [address] [place], [place] [address]   APTT     Status: None   Collection Time: 03/15/22 12:25 PM  Result Value Ref Range   aPTT 26 24 - 36 seconds    Comment: Performed at [facility] [address],  [place], [place] [address]  Blood Culture (routine x 2)     Status: None (Preliminary result)   Collection Time: 03/15/22 12:25 PM   Specimen: BLOOD LEFT FOREARM  Result Value Ref Range   Specimen Description BLOOD LEFT FOREARM    Special Requests      BOTTLES DRAWN AEROBIC AND ANAEROBIC Blood Culture results may not be optimal due to an inadequate volume of blood received in culture bottles    Culture      NO GROWTH < 24 HOURS Performed at [facility], [place] [address], [place], [place] [address]    Report Status PENDING   Blood Culture (routine x 2)     Status: None (Preliminary result)   Collection Time: 03/15/22 12:25 PM   Specimen: BLOOD RIGHT FOREARM  Result Value Ref Range   Specimen Description BLOOD RIGHT FOREARM    Special Requests      BOTTLES DRAWN AEROBIC AND ANAEROBIC Blood Culture results may not be optimal due to an inadequate volume of blood received in culture bottles   Culture      NO GROWTH < 24 HOURS Performed at [facility], [place] [address] [place], [place] [address]    Report Status PENDING   Lactic acid, plasma     Status: None   Collection Time: 03/15/22  1:33 PM  Result Value Ref Range   Lactic Acid, Venous 1.7 0.5 - 1.9 mmol/L    Comment: Performed at [facility], [place] [address] [place], [place] [address]  Urinalysis, Routine w reflex microscopic     Status: Abnormal   Collection Time: 03/15/22  2:40 PM  Result Value Ref Range   Color, Urine YELLOW YELLOW   APPearance CLEAR CLEAR   Specific Gravity, Urine 1.014 1.005 - 1.030   pH 5.0 5.0 - 8.0   Glucose, UA NEGATIVE NEGATIVE mg/dL   Hgb urine dipstick MODERATE (A) NEGATIVE   Bilirubin Urine NEGATIVE NEGATIVE   Ketones, ur NEGATIVE NEGATIVE mg/dL   Protein, ur 100 (A) NEGATIVE mg/dL   Nitrite NEGATIVE NEGATIVE   Leukocytes,Ua NEGATIVE NEGATIVE   RBC / HPF 0-5 0 - 5 RBC/hpf   WBC, UA 0-5 0 - 5 WBC/hpf   Bacteria, UA NONE SEEN NONE SEEN   Squamous Epithelial / HPF 0-5 0 - 5 /HPF   Mucus PRESENT     Comment: Performed at [facility], [place] [address] [place], [place] [address]  Magnesium     Status: Abnormal   Collection Time: 03/15/22  5:00 PM  Result Value Ref Range   Magnesium 1.6 (L) 1.7 - 2.4 mg/dL    Comment: Performed at [facility] [address], [place], [place] [address]  Potassium     Status: None   Collection Time: 03/15/22  7:30 PM   Result Value Ref Range   Potassium 3.7 3.5 - 5.1 mmol/L    Comment: Performed at [facility] [address], [place], [place] [address]  CBG monitoring, ED     Status: Abnormal   Collection Time: 03/15/22  9:47 PM  Result Value Ref Range   Glucose-Capillary 236 (H) 70 - 99 mg/dL    Comment: Glucose reference range applies only to samples taken after fasting for at least 8 hours.  CBC     Status: Abnormal   Collection Time: 03/16/22  4:58 AM  Result Value Ref Range   WBC 32.2 (H) 4.0 - 10.5 K/uL   RBC  4.20 (L) 4.22 - 5.81 MIL/uL   Hemoglobin 12.1 (L) 13.0 - 17.0 g/dL   HCT 38.7 (L) 39.0 - 52.0 %   MCV 92.1 80.0 - 100.0 fL   MCH 28.8 26.0 - 34.0 pg   MCHC 31.3 30.0 - 36.0 g/dL   RDW 19.6 (H) 11.5 - 15.5 %   Platelets 243 150 - 400 K/uL   nRBC 0.0 0.0 - 0.2 %    Comment: Performed at [facility] [address], [place], [place] [address]  Protime-INR     Status: Abnormal   Collection Time: 03/16/22  4:58 AM  Result Value Ref Range   Prothrombin Time 16.4 (H) 11.4 - 15.2 seconds   INR 1.3 (H) 0.8 - 1.2    Comment: (NOTE) INR goal varies based on device and disease states. Performed at [facility], [place] [address] [place], [place] [address]   Cortisol-am, blood     Status: Abnormal   Collection Time: 03/16/22  4:58 AM  Result Value Ref Range   Cortisol - AM 58.3 (H) 6.7 - 22.6 ug/dL    Comment: RESULT CONFIRMED BY MANUAL DILUTION Performed at [facility], [place] [address], [place], [place] [address]   Procalcitonin     Status: None   Collection Time: 03/16/22  4:58 AM  Result Value Ref Range   Procalcitonin 31.56 ng/mL    Comment:        Interpretation: PCT >= 10 ng/mL: Important systemic inflammatory response, almost exclusively due to severe bacterial sepsis or septic shock. (NOTE)       Sepsis PCT Algorithm           Lower Respiratory Tract                                      Infection PCT Algorithm    ----------------------------      ----------------------------         PCT < 0.25 ng/mL                PCT < 0.10 ng/mL          Strongly encourage             Strongly discourage   discontinuation of antibiotics    initiation of antibiotics    ----------------------------     -----------------------------       PCT 0.25 - 0.50 ng/mL            PCT 0.10 - 0.25 ng/mL               OR       >80% decrease in PCT            Discourage initiation of                                            antibiotics      Encourage discontinuation           of antibiotics    ----------------------------     -----------------------------         PCT >= 0.50 ng/mL              PCT 0.26 - 0.50 ng/mL  AND       <80% decrease in PCT             Encourage initiation of                                             antibiotics       Encourage continuation           of antibiotics    ----------------------------     -----------------------------        PCT >= 0.50 ng/mL                  PCT > 0.50 ng/mL               AND         increase in PCT                  Strongly encourage                                      initiation of antibiotics    Strongly encourage escalation           of antibiotics                                     -----------------------------                                           PCT <= 0.25 ng/mL                                                 OR                                        > 80% decrease in PCT                                      Discontinue / Do not initiate                                             antibiotics  Performed at [facility], [place] [address] [place], [place] [address]   Comprehensive metabolic panel     Status: Abnormal   Collection Time: 03/16/22  4:58 AM  Result Value Ref Range   Sodium 134 (L) 135 - 145 mmol/L   Potassium 4.0 3.5 - 5.1 mmol/L   Chloride 97 (L) 98 - 111 mmol/L   CO2 25 22 - 32 mmol/L   Glucose, Bld 313 (H) 70 - 99 mg/dL    Comment: Glucose  reference range applies only to samples taken after fasting for at least 8 hours.  BUN 27 (H) 8 - 23 mg/dL   Creatinine, Ser 1.51 (H) 0.61 - 1.24 mg/dL   Calcium 7.8 (L) 8.9 - 10.3 mg/dL   Total Protein 5.5 (L) 6.5 - 8.1 g/dL   Albumin 2.4 (L) 3.5 - 5.0 g/dL   AST 46 (H) 15 - 41 U/L   ALT 38 0 - 44 U/L   Alkaline Phosphatase 54 38 - 126 U/L   Total Bilirubin 0.7 0.3 - 1.2 mg/dL   GFR, Estimated 48 (L) >60 mL/min    Comment: (NOTE) Calculated using the CKD-EPI Creatinine Equation (2021)    Anion gap 12 5 - 15    Comment: Performed at [facility], [place] [address] [place], [place] [address]  CBG monitoring, ED     Status: Abnormal   Collection Time: 03/16/22  8:53 AM  Result Value Ref Range   Glucose-Capillary 318 (H) 70 - 99 mg/dL    Comment: Glucose reference range applies only to samples taken after fasting for at least 8 hours.  CBG monitoring, ED     Status: Abnormal   Collection Time: 03/16/22  1:00 PM  Result Value Ref Range   Glucose-Capillary 313 (H) 70 - 99 mg/dL    Comment: Glucose reference range applies only to samples taken after fasting for at least 8 hours.   CT CHEST ABDOMEN PELVIS W CONTRAST  Result Date: 03/15/2022 CLINICAL DATA:  Sepsis.  Emesis.  Weakness. EXAM: CT CHEST, ABDOMEN, AND PELVIS WITH CONTRAST TECHNIQUE: Multidetector CT imaging of the chest, abdomen and pelvis was performed following the standard protocol during bolus administration of intravenous contrast. RADIATION DOSE REDUCTION: This exam was performed according to the departmental dose-optimization program which includes automated exposure control, adjustment of the mA and/or kV according to patient size and/or use of iterative reconstruction technique. CONTRAST:  33m OMNIPAQUE IOHEXOL 350 MG/ML SOLN COMPARISON:  CT scans from 10/19/2021 and 10/20/2021 FINDINGS: CT CHEST FINDINGS Cardiovascular: Coronary, aortic arch, and branch vessel atherosclerotic vascular disease. Mild cardiomegaly.  Mediastinum/Nodes: Small calcified lymph nodes compatible with old granulomatous disease. Bilateral gynecomastia Lungs/Pleura: Trace right pleural effusion. Airway thickening is present, suggesting bronchitis or reactive airways disease. Medial right lower lobe subpleural nodule 7 by 4 mm on image 82 series 5, previously the same on 05/06/2019, considered benign. Mild atelectasis in the posterior basal segments of the lower lobes, right greater than left. The 4 mm subpleural nodule in the right upper lobe on image 77 of series 5, no change from 05/06/2019, considered benign. Mild scarring in the right middle lobe. There is some faint right upper lobe perihilar ground-glass density on image 72 series 5, not present on 10/20/2021, probably from mild alveolitis. Musculoskeletal: Old healed right lower rib fracture. CT ABDOMEN PELVIS FINDINGS Hepatobiliary: Cholecystectomy.  Otherwise unremarkable. Pancreas: Unremarkable Spleen: Unremarkable Adrenals/Urinary Tract: No significant findings. Stomach/Bowel: Unremarkable Vascular/Lymphatic: Atherosclerosis is present, including aortoiliac atherosclerotic disease. Atheromatous plaque at the origin of the celiac trunk and SMA, without occlusion. Reproductive: Borderline prostatomegaly. Other: No supplemental non-categorized findings. Musculoskeletal: Fatty left spermatic cord. Posterolateral rod and pedicle screw fixation at L4-5 bilaterally. IMPRESSION: 1. A specific cause for the patient's sepsis is not identified. 2. Faint right upper lobe perihilar ground-glass density, probably from mild alveolitis. 3. Airway thickening is present, suggesting bronchitis or reactive airways disease. 4. Trace right pleural effusion. 5. Old healed right lower rib fracture. 6. Old granulomatous disease. 7. Mild cardiomegaly. 8. Atheromatous plaque at the origin of the celiac trunk and SMA, without occlusion. 9.  Borderline prostatomegaly. 10. Fatty left spermatic cord. 11. Aortic and coronary  atherosclerosis. Aortic Atherosclerosis (ICD10-I70.0). Electronically Signed   By: Van Clines M.D.   On: 03/15/2022 15:48   DG Chest Port 1 View  Result Date: 03/15/2022 CLINICAL DATA:  One-month history of with acute onset emesis and left-sided chest pain EXAM: PORTABLE CHEST 1 VIEW COMPARISON:  Chest radiograph dated 02/04/2022 FINDINGS: Normal lung volumes. Bibasilar patchy opacities. No pleural effusion or pneumothorax. Similar enlarged cardiomediastinal silhouette. The visualized skeletal structures are unremarkable. Right upper quadrant surgical clips. IMPRESSION: Bibasilar patchy opacities, which may represent atelectasis, aspiration, or pneumonia. Electronically Signed   By: Darrin Nipper M.D.   On: 03/15/2022 12:40    Pending Labs Unresulted Labs (From admission, onward)     Start     Ordered   03/22/22 0500  Creatinine, serum  (enoxaparin (LOVENOX)    CrCl >/= 30 ml/min)  Weekly,   R     Comments: while on enoxaparin therapy    03/15/22 1754   03/17/22 0500  Comprehensive metabolic panel  Tomorrow morning,   R        03/16/22 1321   03/17/22 0500  CK  Tomorrow morning,   R        03/16/22 1321   03/17/22 0500  Phosphorus  Tomorrow morning,   R        03/16/22 1321   03/17/22 0500  Magnesium  Tomorrow morning,   R        03/16/22 1321   03/17/22 0500  CBC with Differential/Platelet  Tomorrow morning,   R        03/16/22 1321   03/17/22 0500  Procalcitonin - Baseline  Tomorrow morning,   R        03/16/22 1321   03/17/22 0500  Procalcitonin  Daily,   R      03/16/22 1321   03/16/22 1301  C-reactive protein  Once,   R        03/16/22 1300   03/16/22 1301  Sedimentation rate  Once,   R        03/16/22 1300   03/16/22 1301  Brain natriuretic peptide  Once,   R        03/16/22 1300   03/16/22 1301  Magnesium  Once,   R        03/16/22 1300   03/16/22 1300  CK  Once,   R        03/16/22 1300   03/16/22 0801  Magnesium  Add-on,   AD        03/16/22 0800   03/15/22 1756   Respiratory (~20 pathogens) panel by PCR  (Respiratory panel by PCR (~20 pathogens, ~24 hr TAT)  w precautions)  Add-on,   AD        03/15/22 1755            Vitals/Pain Today's Vitals   03/16/22 1200 03/16/22 1230 03/16/22 1259 03/16/22 1300  BP: 103/65 109/60  116/69  Pulse: 63 62  63  Resp: (!) 22 (!) 22  18  Temp:   98.7 F (37.1 C)   TempSrc:      SpO2: 94% 93%  96%  Weight:      Height:      PainSc:        Isolation Precautions Droplet precaution  Medications Medications  vancomycin (VANCOCIN) IVPB 1000 mg/200 mL premix (1,000 mg Intravenous Not Given 03/15/22 1442)  ceFEPIme (MAXIPIME) 2 g  in sodium chloride 0.9 % 100 mL IVPB (2 g Intravenous New Bag/Given 03/16/22 1338)  vancomycin (VANCOCIN) IVPB 1000 mg/200 mL premix (has no administration in time range)  metoprolol tartrate (LOPRESSOR) tablet 12.5 mg (12.5 mg Oral Given 03/16/22 0901)  rosuvastatin (CRESTOR) tablet 20 mg (20 mg Oral Given 03/15/22 2120)  levothyroxine (SYNTHROID) tablet 75 mcg (75 mcg Oral Given 03/16/22 0604)  pantoprazole (PROTONIX) EC tablet 40 mg (40 mg Oral Given 03/16/22 0901)  albuterol (PROVENTIL) (2.5 MG/3ML) 0.083% nebulizer solution 2.5 mg (has no administration in time range)  enoxaparin (LOVENOX) injection 40 mg (40 mg Subcutaneous Given 03/15/22 1823)  sodium chloride flush (NS) 0.9 % injection 3 mL (3 mLs Intravenous Given 03/16/22 0907)  acetaminophen (TYLENOL) tablet 650 mg (has no administration in time range)    Or  acetaminophen (TYLENOL) suppository 650 mg (has no administration in time range)  senna-docusate (Senokot-S) tablet 1 tablet (has no administration in time range)  insulin aspart (novoLOG) injection 0-15 Units (11 Units Subcutaneous Given 03/16/22 1334)  ondansetron (ZOFRAN) injection 4 mg (4 mg Intravenous Given 03/16/22 0351)  hydrocortisone sodium succinate (SOLU-CORTEF) 100 MG injection 50 mg (has no administration in time range)  insulin aspart (novoLOG) injection 6  Units (6 Units Subcutaneous Given 03/16/22 1335)  insulin glargine-yfgn (SEMGLEE) injection 15 Units (has no administration in time range)  aspirin EC tablet 81 mg (has no administration in time range)  montelukast (SINGULAIR) tablet 10 mg (has no administration in time range)  sodium chloride 0.9 % bolus 1,000 mL (0 mLs Intravenous Stopped 03/15/22 1453)  ceFEPIme (MAXIPIME) 2 g in sodium chloride 0.9 % 100 mL IVPB (0 g Intravenous Stopped 03/15/22 1453)  acetaminophen (TYLENOL) tablet 1,000 mg (1,000 mg Oral Given 03/15/22 1326)  ondansetron (ZOFRAN) injection 4 mg (4 mg Intravenous Given 03/15/22 1326)  potassium chloride 10 mEq in 100 mL IVPB (0 mEq Intravenous Stopped 03/15/22 1853)  vancomycin (VANCOCIN) IVPB 1000 mg/200 mL premix (0 mg Intravenous Stopped 03/15/22 1643)  iohexol (OMNIPAQUE) 350 MG/ML injection 75 mL (75 mLs Intravenous Contrast Given 03/15/22 1512)  hydrocortisone sodium succinate (SOLU-CORTEF) 100 MG injection 100 mg (100 mg Intravenous Given 03/15/22 1646)  magnesium sulfate IVPB 2 g 50 mL (0 g Intravenous Stopped 03/15/22 2221)  potassium chloride SA (KLOR-CON M) CR tablet 40 mEq (40 mEq Oral Given 03/15/22 2215)    Mobility walks Low fall risk   Focused Assessments    R Recommendations: See Admitting Provider Note  Report given to:   Additional Notes:  cbg 311. 11units novolog given.

## 2022-03-17 ENCOUNTER — Inpatient Hospital Stay (HOSPITAL_COMMUNITY): Payer: Medicare HMO

## 2022-03-17 DIAGNOSIS — I5032 Chronic diastolic (congestive) heart failure: Secondary | ICD-10-CM | POA: Diagnosis not present

## 2022-03-17 DIAGNOSIS — A419 Sepsis, unspecified organism: Secondary | ICD-10-CM | POA: Diagnosis not present

## 2022-03-17 DIAGNOSIS — R6889 Other general symptoms and signs: Secondary | ICD-10-CM

## 2022-03-17 DIAGNOSIS — I70202 Unspecified atherosclerosis of native arteries of extremities, left leg: Secondary | ICD-10-CM

## 2022-03-17 DIAGNOSIS — M7989 Other specified soft tissue disorders: Secondary | ICD-10-CM | POA: Diagnosis not present

## 2022-03-17 DIAGNOSIS — E669 Obesity, unspecified: Secondary | ICD-10-CM | POA: Diagnosis not present

## 2022-03-17 DIAGNOSIS — L538 Other specified erythematous conditions: Secondary | ICD-10-CM | POA: Diagnosis not present

## 2022-03-17 DIAGNOSIS — L03116 Cellulitis of left lower limb: Secondary | ICD-10-CM | POA: Diagnosis not present

## 2022-03-17 DIAGNOSIS — R52 Pain, unspecified: Secondary | ICD-10-CM

## 2022-03-17 DIAGNOSIS — J189 Pneumonia, unspecified organism: Secondary | ICD-10-CM | POA: Diagnosis not present

## 2022-03-17 DIAGNOSIS — R000 Tachycardia, unspecified: Secondary | ICD-10-CM

## 2022-03-17 DIAGNOSIS — N179 Acute kidney failure, unspecified: Secondary | ICD-10-CM | POA: Diagnosis not present

## 2022-03-17 DIAGNOSIS — R008 Other abnormalities of heart beat: Secondary | ICD-10-CM

## 2022-03-17 DIAGNOSIS — G4733 Obstructive sleep apnea (adult) (pediatric): Secondary | ICD-10-CM | POA: Diagnosis not present

## 2022-03-17 DIAGNOSIS — D860 Sarcoidosis of lung: Secondary | ICD-10-CM | POA: Diagnosis not present

## 2022-03-17 DIAGNOSIS — R0989 Other specified symptoms and signs involving the circulatory and respiratory systems: Secondary | ICD-10-CM | POA: Diagnosis not present

## 2022-03-17 DIAGNOSIS — E1165 Type 2 diabetes mellitus with hyperglycemia: Secondary | ICD-10-CM | POA: Diagnosis not present

## 2022-03-17 LAB — GLUCOSE, CAPILLARY
Glucose-Capillary: 281 mg/dL — ABNORMAL HIGH (ref 70–99)
Glucose-Capillary: 297 mg/dL — ABNORMAL HIGH (ref 70–99)
Glucose-Capillary: 302 mg/dL — ABNORMAL HIGH (ref 70–99)
Glucose-Capillary: 295 mg/dL — ABNORMAL HIGH (ref 70–99)

## 2022-03-17 LAB — ECHOCARDIOGRAM LIMITED
Area-P 1/2: 4.24 cm2
Height: 70 in
S' Lateral: 3.9 cm
Single Plane A4C EF: 44.6 %
Weight: 3440 oz

## 2022-03-17 LAB — CBC WITH DIFFERENTIAL/PLATELET
Abs Immature Granulocytes: 0.53 10*3/uL — ABNORMAL HIGH (ref 0.00–0.07)
Basophils Absolute: 0.1 10*3/uL (ref 0.0–0.1)
Basophils Relative: 0 %
Eosinophils Absolute: 0 10*3/uL (ref 0.0–0.5)
Eosinophils Relative: 0 %
HCT: 35 % — ABNORMAL LOW (ref 39.0–52.0)
Hemoglobin: 10.9 g/dL — ABNORMAL LOW (ref 13.0–17.0)
Immature Granulocytes: 2 %
Lymphocytes Relative: 2 %
Lymphs Abs: 0.5 10*3/uL — ABNORMAL LOW (ref 0.7–4.0)
MCH: 28.4 pg (ref 26.0–34.0)
MCHC: 31.1 g/dL (ref 30.0–36.0)
MCV: 91.1 fL (ref 80.0–100.0)
Monocytes Absolute: 0.8 10*3/uL (ref 0.1–1.0)
Monocytes Relative: 3 %
Neutro Abs: 24.7 10*3/uL — ABNORMAL HIGH (ref 1.7–7.7)
Neutrophils Relative %: 93 %
Platelets: 208 10*3/uL (ref 150–400)
RBC: 3.84 MIL/uL — ABNORMAL LOW (ref 4.22–5.81)
RDW: 19.8 % — ABNORMAL HIGH (ref 11.5–15.5)
WBC: 26.6 10*3/uL — ABNORMAL HIGH (ref 4.0–10.5)
nRBC: 0 % (ref 0.0–0.2)

## 2022-03-17 LAB — VAS US ABI WITH/WO TBI

## 2022-03-17 LAB — COMPREHENSIVE METABOLIC PANEL
ALT: 31 U/L (ref 0–44)
AST: 32 U/L (ref 15–41)
Albumin: 2.2 g/dL — ABNORMAL LOW (ref 3.5–5.0)
Alkaline Phosphatase: 63 U/L (ref 38–126)
Anion gap: 10 (ref 5–15)
BUN: 37 mg/dL — ABNORMAL HIGH (ref 8–23)
CO2: 24 mmol/L (ref 22–32)
Calcium: 8.1 mg/dL — ABNORMAL LOW (ref 8.9–10.3)
Chloride: 99 mmol/L (ref 98–111)
Creatinine, Ser: 1.7 mg/dL — ABNORMAL HIGH (ref 0.61–1.24)
GFR, Estimated: 41 mL/min — ABNORMAL LOW (ref >60)
Glucose, Bld: 297 mg/dL — ABNORMAL HIGH (ref 70–99)
Potassium: 4.1 mmol/L (ref 3.5–5.1)
Sodium: 133 mmol/L — ABNORMAL LOW (ref 135–145)
Total Bilirubin: 0.7 mg/dL (ref 0.3–1.2)
Total Protein: 5.7 g/dL — ABNORMAL LOW (ref 6.5–8.1)

## 2022-03-17 LAB — CK: Total CK: 69 U/L (ref 49–397)

## 2022-03-17 LAB — MAGNESIUM: Magnesium: 2.2 mg/dL (ref 1.7–2.4)

## 2022-03-17 LAB — PROCALCITONIN: Procalcitonin: 20.13 ng/mL

## 2022-03-17 LAB — URIC ACID: Uric Acid, Serum: 8.3 mg/dL (ref 3.7–8.6)

## 2022-03-17 LAB — MRSA NEXT GEN BY PCR, NASAL: MRSA by PCR Next Gen: NOT DETECTED

## 2022-03-17 LAB — PHOSPHORUS: Phosphorus: 2.7 mg/dL (ref 2.5–4.6)

## 2022-03-17 MED ORDER — CEFAZOLIN SODIUM-DEXTROSE 2-4 GM/100ML-% IV SOLN: 2.0000 g | Freq: Three times a day (TID) | INTRAVENOUS | Status: DC

## 2022-03-17 MED ORDER — INSULIN GLARGINE-YFGN 100 UNIT/ML ~~LOC~~ SOLN
20.0000 [IU] | Freq: Two times a day (BID) | SUBCUTANEOUS | Status: DC
  Filled 2022-03-17: qty 0.2

## 2022-03-17 MED ORDER — CEFAZOLIN SODIUM-DEXTROSE 2-4 GM/100ML-% IV SOLN
2.0000 g | Freq: Two times a day (BID) | INTRAVENOUS | Status: DC
  Administered 2022-03-17 – 2022-03-18 (×3): 2 g via INTRAVENOUS
  Filled 2022-03-17 (×3): qty 100

## 2022-03-17 MED ORDER — PREDNISONE 10 MG PO TABS
10.0000 mg | ORAL_TABLET | Freq: Every day | ORAL | Status: DC
  Administered 2022-03-18 – 2022-03-21 (×4): 10 mg via ORAL
  Filled 2022-03-17 (×4): qty 1

## 2022-03-17 MED ORDER — INSULIN ASPART 100 UNIT/ML IJ SOLN
0.0000 [IU] | Freq: Three times a day (TID) | INTRAMUSCULAR | Status: DC
  Administered 2022-03-17: 15 [IU] via SUBCUTANEOUS
  Administered 2022-03-18: 7 [IU] via SUBCUTANEOUS
  Administered 2022-03-18: 11 [IU] via SUBCUTANEOUS
  Administered 2022-03-18 – 2022-03-19 (×2): 15 [IU] via SUBCUTANEOUS
  Administered 2022-03-19: 7 [IU] via SUBCUTANEOUS
  Administered 2022-03-19: 11 [IU] via SUBCUTANEOUS
  Administered 2022-03-20: 7 [IU] via SUBCUTANEOUS
  Administered 2022-03-20: 3 [IU] via SUBCUTANEOUS
  Administered 2022-03-20: 15 [IU] via SUBCUTANEOUS

## 2022-03-17 MED ORDER — INSULIN GLARGINE-YFGN 100 UNIT/ML ~~LOC~~ SOLN
25.0000 [IU] | Freq: Two times a day (BID) | SUBCUTANEOUS | Status: DC
  Administered 2022-03-17 – 2022-03-18 (×3): 25 [IU] via SUBCUTANEOUS
  Filled 2022-03-17 (×5): qty 0.25

## 2022-03-17 MED ORDER — INSULIN ASPART 100 UNIT/ML IJ SOLN
0.0000 [IU] | Freq: Every day | INTRAMUSCULAR | Status: DC
  Administered 2022-03-17 – 2022-03-18 (×2): 3 [IU] via SUBCUTANEOUS
  Administered 2022-03-19 – 2022-03-20 (×2): 2 [IU] via SUBCUTANEOUS

## 2022-03-17 MED ORDER — ADULT MULTIVITAMIN W/MINERALS CH
1.0000 | ORAL_TABLET | Freq: Every day | ORAL | Status: DC
  Administered 2022-03-17 – 2022-03-21 (×5): 1 via ORAL
  Filled 2022-03-17 (×5): qty 1

## 2022-03-17 MED ORDER — GLUCERNA SHAKE PO LIQD
237.0000 mL | Freq: Three times a day (TID) | ORAL | Status: DC
  Administered 2022-03-18 – 2022-03-21 (×4): 237 mL via ORAL
  Filled 2022-03-17: qty 237

## 2022-03-17 NOTE — Progress Notes (Signed)
Lower extremity venous bilateral and ABI study completed.   Please see CV Proc for preliminary results.   Darlin Coco, RDMS, RVT

## 2022-03-17 NOTE — Plan of Care (Signed)

## 2022-03-17 NOTE — Progress Notes (Signed)
PROGRESS NOTE  [name] BTD:[id number] DOB: [birth year]   PCP: Shirline Frees, MD  Patient is from: Home.  Lives with his wife.  DOA: 03/15/2022 LOS: 2  Chief complaints Chief Complaint  Patient presents with   Weakness     Brief Narrative / Interim history: 77 year old M with PMH of CAD, diastolic CHF, OSA not on CPAP, adrenal insufficiency on prednisone, sarcoidosis, DM-2, CKD-3, PAD, HTN, hypothyroidism, anemia, obesity and GERD presenting with generalized weakness with associated nausea, vomiting, myalgia, fever, productive cough with blood-tinged phlegm and epistaxis for about a week and admitted for severe sepsis in the setting of cellulitis, possible pneumonia and rule out bacteremia.  Recently treated with Z-Pak and steroid by PCP for possible pneumonia.  Patient's wife noted LLE swelling and erythema when EMS arrived.  In ED, febrile to 103.4.  HR 116.  RR 24.  BP normal.  Some brief desaturations to 80s on RA.  WBC 34 with left shift.  K2.2. Cr 1.5 (baseline 1.2).  LA 2.8.  COVID-19, influenza and RSV PCR negative.  CXR with bibasilar patchy opacity.  CT chest/abdomen/pelvis concerning for RUL groundglass and bronchial thickening.  Cultures obtained.  He was started on broad-spectrum antibiotics with vancomycin and cefepime.   Left foot and left leg x-ray suggestive of cellulitis and chronic changes of Charcot arthropathy but no osteomyelitis or abscess.  LE venous Doppler negative for DVT. ABI nonconclusive on the right and not obtainable on the left.  Vascular surgery consulted.  TTE without significant finding.  Blood cultures NGTD.  Cellulitis and sepsis physiology improving.  Antibiotic de-escalated to IV Ancef.   Subjective: Seen and examined earlier this morning.  No major events overnight of this morning.  No complaints other than some LLE pain.  Denies chest pain, shortness of breath or cough.  Denies GI or UTI symptoms.  LLE redness and pain improved but still  tender.  Objective: Vitals:   03/16/22 2043 03/16/22 2358 03/17/22 0313 03/17/22 0800  BP: 120/65  133/68 139/71  Pulse: 74 72 66 66  Resp: '16 18 18 16  '$ Temp: 98.8 F (37.1 C)  98 F (36.7 C) 98.3 F (36.8 C)  TempSrc: Oral   Oral  SpO2: 94% 94% 94% 96%  Weight:      Height:        Examination:  GENERAL: No apparent distress.  Nontoxic. HEENT: MMM.  Vision and hearing grossly intact.  NECK: Supple.  No apparent JVD.  RESP:  No IWOB.  Fair aeration bilaterally. CVS:  RRR. Heart sounds normal.  ABD/GI/GU: BS+. Abd soft, NTND.  MSK/EXT:  Moves extremities.  LLE edema, erythema and tenderness. SKIN: As above. NEURO: Sleepy but wakes to voice.  Oriented appropriately.  No apparent focal neuro deficit. PSYCH: Calm. Normal affect.   Procedures:  None  Microbiology summarized: [id number], influenza and RSV PCR nonreactive. Blood cultures NGTD Full RVP pending. Urine culture with multiple species.  Assessment and plan: Principal Problem:   Severe sepsis (HCC) Active Problems:   Sarcoidosis   GERD   OSA (obstructive sleep apnea)   History of adrenal insufficiency   Coronary atherosclerosis of native coronary artery   Chronic diastolic heart failure (HCC)   Obesity   Pulmonary sarcoidosis ([place])   Uncontrolled type 2 diabetes mellitus with hyperglycemia, with long-term current use of insulin (HCC)   Hyperlipidemia   Normocytic anemia   Hypothyroidism   Peripheral arterial disease (HCC)   Acute renal failure superimposed on stage 3a chronic kidney disease (  [place])   HTN (hypertension)   Cellulitis of left lower extremity   Right upper lobe pneumonia  Severe sepsis due to left lower extremity cellulitis and possible RUL pneumonia: POA.  Presents with productive cough with blood-tinged phlegm, nausea, vomiting, myalgia, fever, chills, LLE swelling, erythema and tenderness.  Has significant leukocytosis with tachycardia, tachypnea, lactic acidosis and AKI.  CXR raises concern  for RUL groundglass opacity and bronchitic changes.  LLE x-ray without acute finding.  Venous flow probe negative for DVT.  ABI abnormal.  Blood cultures NGTD.  Markedly elevated inflammatory markers.  Pro-Cal 32>> 20.  WBC 34>> 26.  Patient is immunocompromised on steroid for adrenal insufficiency.  Lactic acidosis resolved.  Cellulitis seems to have improved.  Respiratory symptoms resolved. -De-escalate antibiotic to IV Ancef -Doubt utility for and typical coverage as he recently completed Z-Pak -Discontinue IV Solu-Cortef.  Resume home prednisone 10 mg daily. -PT/OT eval  AKI on CKD-3A: Likely prerenal from GI loss and concurrent use of diuretics and acute illness. Cr slightly up. Recent Labs    10/04/21 0141 10/05/21 0553 10/06/21 0522 10/19/21 1717 10/20/21 0411 10/21/21 0519 12/28/21 1252 03/15/22 1225 03/16/22 0458 03/17/22 0317  BUN 25* 22 20 35* 25* 25* 19 23 27* 37*  CREATININE 1.50* 1.19 1.16 1.60* 1.25* 1.10 1.17 1.51* 1.51* 1.70*  -Hold diuretics or nephrotoxic meds -Change vancomycin to IV Ancef. -Continue trending  Chronic diastolic CHF: Appears euvolemic on exam except for LLE edema likely from cellulitis.  Limited TTE with LVEF of 50 to 55% (55-60 previously) and G1 DD.  BNP elevated to 1000 (higher than previous value).  Has no cardiopulmonary symptoms.  No overt fluid overload other than LLE edema from cellulitis. -Continue holding Lasix given his renal function -Monitor respiratory and fluid status   Uncontrolled IDDM-2 with hyperglycemia and CKD-3A: A1c 10% in 09/2021.  On 70/30 at home. Recent Labs  Lab 03/16/22 1300 03/16/22 1614 03/16/22 2053 03/17/22 0850 03/17/22 1220  GLUCAP 313* 205* 194* 281* 297*  -Increase SSI to resistant scale.  Add nightly coverage. -Continue NovoLog 6 units 3 times daily with meals -Increase Semglee from 15 to 20 units twice daily -Recheck hemoglobin A1c  History of CAD/PAD/HLD: S/p 3 DES in November 2022.  No chest pain.   Preliminary on ABI nonconclusive on the right and unobtainable on the left.  -Continue home aspirin, metoprolol and statin. -Vascular surgery consulted. -Hold Plavix pending vascular surgery evaluation   Hypertension: Normotensive -Holding home Lasix and hydralazine in the setting of low normal blood pressure and sepsis -Continue metoprolol   Adrenal insufficiency: On prednisone 10 mg daily at home. -Discontinue IV Solu-Cortef. -Resume home prednisone.   OSA not on CPAP-under evaluation for CPAP. -Trying nightly CPAP here.  Hypokalemia/hypomagnesemia -Monitor replenish as appropriate  Generalized weakness/physical deconditioning/chronic back pain/radiculopathy -PT/OT eval  Epistaxis: No active bleeding. -Avoid oxygen as able -Saline nose spray   GERD -Continue PPI   History of pulmonary sarcoid: Stable on CT.   Obesity Body mass index is 30.85 kg/m.          DVT prophylaxis:  enoxaparin (LOVENOX) injection 40 mg Start: 03/15/22 1800  Code Status: Full code-confirmed with patient and wife. Family Communication: Updated patient's wife at bedside Level of care: Med-Surg Status is: Inpatient Remains inpatient appropriate because: Severe sepsis in the setting of LLE cellulitis, ?pneumonia   Final disposition: TBD Consultants:  Vascular surgery  55 minutes with more than 50% spent in reviewing records, counseling patient/family and coordinating care.  Sch Meds:  Scheduled Meds:  aspirin EC  81 mg Oral QPM   enoxaparin (LOVENOX) injection  40 mg Subcutaneous Q24H   insulin aspart  0-15 Units Subcutaneous TID WC   insulin aspart  6 Units Subcutaneous TID WC   insulin glargine-yfgn  15 Units Subcutaneous BID   levothyroxine  75 mcg Oral Q0600   metoprolol tartrate  12.5 mg Oral BID   pantoprazole  40 mg Oral Daily   [START ON 03/18/2022] predniSONE  10 mg Oral Q breakfast   rosuvastatin  20 mg Oral QHS   sodium chloride flush  3 mL Intravenous Q12H    Continuous Infusions:   ceFAZolin (ANCEF) IV     PRN Meds:.acetaminophen **OR** acetaminophen, albuterol, montelukast, ondansetron (ZOFRAN) IV, senna-docusate  Antimicrobials: Anti-infectives (From admission, onward)    Start     Dose/Rate Route Frequency Ordered Stop   03/17/22 1430  ceFAZolin (ANCEF) IVPB 2g/100 mL premix        2 g 200 mL/hr over 30 Minutes Intravenous Every 8 hours 03/17/22 1337 03/24/22 1359   03/16/22 1500  vancomycin (VANCOCIN) IVPB 1000 mg/200 mL premix  Status:  Discontinued        1,000 mg 200 mL/hr over 60 Minutes Intravenous Every 24 hours 03/15/22 1433 03/17/22 1337   03/16/22 0100  ceFEPIme (MAXIPIME) 2 g in sodium chloride 0.9 % 100 mL IVPB  Status:  Discontinued        2 g 200 mL/hr over 30 Minutes Intravenous Every 12 hours 03/15/22 1433 03/17/22 1337   03/15/22 1445  vancomycin (VANCOCIN) IVPB 1000 mg/200 mL premix        1,000 mg 200 mL/hr over 60 Minutes Intravenous Every hour 03/15/22 1436 03/15/22 1643   03/15/22 1200  vancomycin (VANCOCIN) IVPB 1000 mg/200 mL premix        1,000 mg 200 mL/hr over 60 Minutes Intravenous Every hour 03/15/22 1148 03/15/22 1359   03/15/22 1145  vancomycin (VANCOCIN) IVPB 1000 mg/200 mL premix  Status:  Discontinued        1,000 mg 200 mL/hr over 60 Minutes Intravenous  Once 03/15/22 1133 03/15/22 1148   03/15/22 1145  ceFEPIme (MAXIPIME) 2 g in sodium chloride 0.9 % 100 mL IVPB        2 g 200 mL/hr over 30 Minutes Intravenous  Once 03/15/22 1133 03/15/22 1453        I have personally reviewed the following labs and images: CBC: Recent Labs  Lab 03/15/22 1225 03/16/22 0458 03/17/22 0317  WBC 34.3* 32.2* 26.6*  NEUTROABS 30.8*  --  24.7*  HGB 14.0 12.1* 10.9*  HCT 45.8 38.7* 35.0*  MCV 91.6 92.1 91.1  PLT 305 243 208   BMP &GFR Recent Labs  Lab 03/15/22 1225 03/15/22 1700 03/15/22 1930 03/16/22 0458 03/17/22 0317  NA 141  --   --  134* 133*  K 2.2*  --  3.7 4.0 4.1  CL 98  --   --  97*  99  CO2 31  --   --  25 24  GLUCOSE 102*  --   --  313* 297*  BUN 23  --   --  27* 37*  CREATININE 1.51*  --   --  1.51* 1.70*  CALCIUM 9.1  --   --  7.8* 8.1*  MG  --  1.6*  --  2.0 2.2  PHOS  --   --   --   --  2.7   Estimated Creatinine Clearance:  43.3 mL/min (A) (by C-G formula based on SCr of 1.7 mg/dL (H)). Liver & Pancreas: Recent Labs  Lab 03/15/22 1225 03/16/22 0458 03/17/22 0317  AST 48* 46* 32  ALT 38 38 31  ALKPHOS 60 54 63  BILITOT 1.0 0.7 0.7  PROT 6.7 5.5* 5.7*  ALBUMIN 3.1* 2.4* 2.2*   No results for input(s): "LIPASE", "AMYLASE" in the last 168 hours. No results for input(s): "AMMONIA" in the last 168 hours. Diabetic: No results for input(s): "HGBA1C" in the last 72 hours. Recent Labs  Lab 03/16/22 1300 03/16/22 1614 03/16/22 2053 03/17/22 0850 03/17/22 1220  GLUCAP 313* 205* 194* 281* 297*   Cardiac Enzymes: Recent Labs  Lab 03/16/22 0458 03/17/22 0317  CKTOTAL 151 69   No results for input(s): "PROBNP" in the last 8760 hours. Coagulation Profile: Recent Labs  Lab 03/15/22 1225 03/16/22 0458  INR 1.1 1.3*   Thyroid Function Tests: No results for input(s): "TSH", "T4TOTAL", "FREET4", "T3FREE", "THYROIDAB" in the last 72 hours. Lipid Profile: No results for input(s): "CHOL", "HDL", "LDLCALC", "TRIG", "CHOLHDL", "LDLDIRECT" in the last 72 hours. Anemia Panel: No results for input(s): "VITAMINB12", "FOLATE", "FERRITIN", "TIBC", "IRON", "RETICCTPCT" in the last 72 hours. Urine analysis:    Component Value Date/Time   COLORURINE YELLOW 03/15/2022 1440   APPEARANCEUR CLEAR 03/15/2022 1440   LABSPEC 1.014 03/15/2022 1440   PHURINE 5.0 03/15/2022 1440   GLUCOSEU NEGATIVE 03/15/2022 1440   HGBUR MODERATE (A) 03/15/2022 1440   BILIRUBINUR NEGATIVE 03/15/2022 1440   KETONESUR NEGATIVE 03/15/2022 1440   PROTEINUR 100 (A) 03/15/2022 1440   UROBILINOGEN 0.2 11/09/2010 2146   NITRITE NEGATIVE 03/15/2022 1440   LEUKOCYTESUR NEGATIVE 03/15/2022  1440   Sepsis Labs: Invalid input(s): "PROCALCITONIN", "LACTICIDVEN"  Microbiology: Recent Results (from the past 240 hour(s))  Resp panel by RT-PCR (RSV, Flu A&B, Covid) Anterior Nasal Swab     Status: None   Collection Time: 03/15/22 11:33 AM   Specimen: Anterior Nasal Swab  Result Value Ref Range Status   SARS Coronavirus 2 by RT PCR NEGATIVE NEGATIVE Final    Comment: (NOTE) SARS-CoV-2 target nucleic acids are NOT DETECTED.  The SARS-CoV-2 RNA is generally detectable in upper respiratory specimens during the acute phase of infection. The lowest concentration of SARS-CoV-2 viral copies this assay can detect is 138 copies/mL. A negative result does not preclude SARS-Cov-2 infection and should not be used as the sole basis for treatment or other patient management decisions. A negative result may occur with  improper specimen collection/handling, submission of specimen other than nasopharyngeal swab, presence of viral mutation(s) within the areas targeted by this assay, and inadequate number of viral copies(<138 copies/mL). A negative result must be combined with clinical observations, patient history, and epidemiological information. The expected result is Negative.  Fact Sheet for Patients:  EntrepreneurPulse.com.au  Fact Sheet for Healthcare Providers:  IncredibleEmployment.be  This test is no t yet approved or cleared by the [place] FDA and  has been authorized for detection and/or diagnosis of SARS-CoV-2 by FDA under an Emergency Use Authorization (EUA). This EUA will remain  in effect (meaning this test can be used) for the duration of the COVID-19 declaration under Section 564(b)(1) of the Act, 21 U.S.C.section 360bbb-3(b)(1), unless the authorization is terminated  or revoked sooner.       Influenza A by PCR NEGATIVE NEGATIVE Final   Influenza B by PCR NEGATIVE NEGATIVE Final    Comment: (NOTE) The Xpert Xpress  SARS-CoV-2/FLU/RSV plus assay is intended as an  aid in the diagnosis of influenza from Nasopharyngeal swab specimens and should not be used as a sole basis for treatment. Nasal washings and aspirates are unacceptable for Xpert Xpress SARS-CoV-2/FLU/RSV testing.  Fact Sheet for Patients: EntrepreneurPulse.com.au  Fact Sheet for Healthcare Providers: IncredibleEmployment.be  This test is not yet approved or cleared by the [place] FDA and has been authorized for detection and/or diagnosis of SARS-CoV-2 by FDA under an Emergency Use Authorization (EUA). This EUA will remain in effect (meaning this test can be used) for the duration of the COVID-19 declaration under Section 564(b)(1) of the Act, 21 U.S.C. section 360bbb-3(b)(1), unless the authorization is terminated or revoked.     Resp Syncytial Virus by PCR NEGATIVE NEGATIVE Final    Comment: (NOTE) Fact Sheet for Patients: EntrepreneurPulse.com.au  Fact Sheet for Healthcare Providers: IncredibleEmployment.be  This test is not yet approved or cleared by the [place] FDA and has been authorized for detection and/or diagnosis of SARS-CoV-2 by FDA under an Emergency Use Authorization (EUA). This EUA will remain in effect (meaning this test can be used) for the duration of the COVID-19 declaration under Section 564(b)(1) of the Act, 21 U.S.C. section 360bbb-3(b)(1), unless the authorization is terminated or revoked.  Performed at [facility], [place] [address] [place], [place] [address]   Urine Culture     Status: Abnormal   Collection Time: 03/15/22 11:33 AM   Specimen: In/Out Cath Urine  Result Value Ref Range Status   Specimen Description IN/OUT CATH URINE  Final   Special Requests   Final    NONE Performed at [facility], [place] [address], [place], [place] [address]    Culture MULTIPLE SPECIES PRESENT, SUGGEST RECOLLECTION (A)   Final   Report Status 03/16/2022 FINAL  Final  Respiratory (~20 pathogens) panel by PCR     Status: None   Collection Time: 03/15/22 11:33 AM   Specimen: Nasopharyngeal Swab; Respiratory  Result Value Ref Range Status   Adenovirus NOT DETECTED NOT DETECTED Final   Coronavirus 229E NOT DETECTED NOT DETECTED Final    Comment: (NOTE) The Coronavirus on the Respiratory Panel, DOES NOT test for the novel  Coronavirus (2019 nCoV)    Coronavirus HKU1 NOT DETECTED NOT DETECTED Final   Coronavirus NL63 NOT DETECTED NOT DETECTED Final   Coronavirus OC43 NOT DETECTED NOT DETECTED Final   Metapneumovirus NOT DETECTED NOT DETECTED Final   Rhinovirus / Enterovirus NOT DETECTED NOT DETECTED Final   Influenza A NOT DETECTED NOT DETECTED Final   Influenza B NOT DETECTED NOT DETECTED Final   Parainfluenza Virus 1 NOT DETECTED NOT DETECTED Final   Parainfluenza Virus 2 NOT DETECTED NOT DETECTED Final   Parainfluenza Virus 3 NOT DETECTED NOT DETECTED Final   Parainfluenza Virus 4 NOT DETECTED NOT DETECTED Final   Respiratory Syncytial Virus NOT DETECTED NOT DETECTED Final   Bordetella pertussis NOT DETECTED NOT DETECTED Final   Bordetella Parapertussis NOT DETECTED NOT DETECTED Final   Chlamydophila pneumoniae NOT DETECTED NOT DETECTED Final   Mycoplasma pneumoniae NOT DETECTED NOT DETECTED Final    Comment: Performed at [facility], [place]. [address], [place], [place] [address]  Blood Culture (routine x 2)     Status: None (Preliminary result)   Collection Time: 03/15/22 12:25 PM   Specimen: BLOOD LEFT FOREARM  Result Value Ref Range Status   Specimen Description BLOOD LEFT FOREARM  Final   Special Requests   Final    BOTTLES DRAWN AEROBIC AND  ANAEROBIC Blood Culture results may not be optimal due to an inadequate volume of blood received in culture bottles   Culture   Final    NO GROWTH 2 DAYS Performed at [place] [address] [place], [place] [address]    Report Status  PENDING  Incomplete  Blood Culture (routine x 2)     Status: None (Preliminary result)   Collection Time: 03/15/22 12:25 PM   Specimen: BLOOD RIGHT FOREARM  Result Value Ref Range Status   Specimen Description BLOOD RIGHT FOREARM  Final   Special Requests   Final    BOTTLES DRAWN AEROBIC AND ANAEROBIC Blood Culture results may not be optimal due to an inadequate volume of blood received in culture bottles   Culture   Final    NO GROWTH 2 DAYS Performed at [facility], [place] [address], [place], [place] [address]    Report Status PENDING  Incomplete    Radiology Studies: VAS [place] LOWER EXTREMITY VENOUS (DVT)  Result Date: 03/17/2022  Lower Venous DVT Study Patient Name:  ASSER LUCENA  Date of Exam:   03/17/2022 Medical Rec #: [id number]   Accession #:    [id number] Date of Birth: 06/14/1945   Patient Gender: M Patient Age:   29 years Exam Location:  [facility] Procedure:      VAS [place] LOWER EXTREMITY VENOUS (DVT) Referring Phys: Bretta Bang Kamonte Mcmichen --------------------------------------------------------------------------------  Indications: Pain, swelling, and erythema LT>RT, left leg cellulitis.  Comparison Study: 10-01-2021 Prior left lower extremity venous study was                   negative for DVT. Performing Technologist: Darlin Coco RDMS, RVT  Examination Guidelines: A complete evaluation includes B-mode imaging, spectral Doppler, color Doppler, and power Doppler as needed of all accessible portions of each vessel. Bilateral testing is considered an integral part of a complete examination. Limited examinations for reoccurring indications may be performed as noted. The reflux portion of the exam is performed with the patient in reverse Trendelenburg.  +---------+---------------+---------+-----------+----------+--------------+ RIGHT    CompressibilityPhasicitySpontaneityPropertiesThrombus Aging +---------+---------------+---------+-----------+----------+--------------+ CFV      Full            Yes      Yes                                 +---------+---------------+---------+-----------+----------+--------------+ SFJ      Full                                                        +---------+---------------+---------+-----------+----------+--------------+ FV Prox  Full                                                        +---------+---------------+---------+-----------+----------+--------------+ FV Mid   Full                                                        +---------+---------------+---------+-----------+----------+--------------+  FV DistalFull                                                        +---------+---------------+---------+-----------+----------+--------------+ PFV      Full                                                        +---------+---------------+---------+-----------+----------+--------------+ POP      Full           Yes      Yes                                 +---------+---------------+---------+-----------+----------+--------------+ PTV      Full                                                        +---------+---------------+---------+-----------+----------+--------------+ PERO     Full                                                        +---------+---------------+---------+-----------+----------+--------------+ Gastroc  Full                                                        +---------+---------------+---------+-----------+----------+--------------+   +---------+---------------+---------+-----------+----------+-------------------+ LEFT     CompressibilityPhasicitySpontaneityPropertiesThrombus Aging      +---------+---------------+---------+-----------+----------+-------------------+ CFV      Full           Yes      Yes                                      +---------+---------------+---------+-----------+----------+-------------------+ SFJ      Full                                                              +---------+---------------+---------+-----------+----------+-------------------+ FV Prox  Full                                                             +---------+---------------+---------+-----------+----------+-------------------+ FV Mid   Full                                                             +---------+---------------+---------+-----------+----------+-------------------+  FV DistalFull                                                             +---------+---------------+---------+-----------+----------+-------------------+ PFV      Full                                                             +---------+---------------+---------+-----------+----------+-------------------+ POP      Full           Yes      Yes                                      +---------+---------------+---------+-----------+----------+-------------------+ PTV      Full                                                             +---------+---------------+---------+-----------+----------+-------------------+ PERO     Full                                         Not well visualized                                                       secondary to                                                              overlying                                                                 edema--patent by                                                          color               +---------+---------------+---------+-----------+----------+-------------------+ Gastroc  Full                                                             +---------+---------------+---------+-----------+----------+-------------------+  Summary: RIGHT: - There is no evidence of deep vein thrombosis in the lower extremity.  - No cystic structure found in the popliteal fossa.  LEFT: - There is no evidence of deep vein thrombosis in the lower  extremity.  - No cystic structure found in the popliteal fossa.  *See table(s) above for measurements and observations.    Preliminary    VAS [place] ABI WITH/WO TBI  Result Date: 03/17/2022  LOWER EXTREMITY DOPPLER STUDY Patient Name:  TANK DIFIORE  Date of Exam:   03/17/2022 Medical Rec #: [id number]   Accession #:    [id number] Date of Birth: Nov 27, 1945   Patient Gender: M Patient Age:   71 years Exam Location:  [facility] Procedure:      VAS [place] ABI WITH/WO TBI Referring Phys: Bretta Bang Lewayne Pauley --------------------------------------------------------------------------------  Indications: Decreased pulses on clinical exam, history of PAD with              non-compressible/abnormal ABIs High Risk Factors: Hypertension, Diabetes, coronary artery disease.  Limitations: Today's exam was limited due to left leg pain in the setting of              cellulitis, bandaging to right upper arm. Comparison Study: 07-04-2019 Prior ABI w/ TBI was non-compressible. Right TBI                   0.76 WNL, Left TBI 0.68 Abnormal. Performing Technologist: Darlin Coco RDMS RVT  Examination Guidelines: A complete evaluation includes at minimum, Doppler waveform signals and systolic blood pressure reading at the level of bilateral brachial, anterior tibial, and posterior tibial arteries, when vessel segments are accessible. Bilateral testing is considered an integral part of a complete examination. Photoelectric Plethysmograph (PPG) waveforms and toe systolic pressure readings are included as required and additional duplex testing as needed. Limited examinations for reoccurring indications may be performed as noted.  ABI Findings: +---------+------------------+-----+-----------+--------+ Right    Rt Pressure (mmHg)IndexWaveform   Comment  +---------+------------------+-----+-----------+--------+ Brachial 140                    triphasic           +---------+------------------+-----+-----------+--------+ PTA      255                1.82 multiphasic         +---------+------------------+-----+-----------+--------+ DP       255               1.82 multiphasic         +---------+------------------+-----+-----------+--------+ Great Toe132               0.94 Normal              +---------+------------------+-----+-----------+--------+ +---------+------------------+-----+----------+--------------------------------+ Left     Lt Pressure (mmHg)IndexWaveform  Comment                          +---------+------------------+-----+----------+--------------------------------+ Brachial                        triphasic Bandaging- unable to obtain                                                pressure                         +---------+------------------+-----+----------+--------------------------------+  PTA                             monophasicUnable to obtain pressure                                                  secondary to pain in the setting                                           of cellulitis                    +---------+------------------+-----+----------+--------------------------------+ DP                              biphasic  Unable to obtain pressure                                                  secondary to pain in the setting                                           of cellulitis                    +---------+------------------+-----+----------+--------------------------------+ Great Toe55                0.39 Abnormal                                   +---------+------------------+-----+----------+--------------------------------+ +-------+------------+-----------+----------------+------------+ ABI/TBIToday's ABI Today's TBIPrevious ABI    Previous TBI +-------+------------+-----------+----------------+------------+ Right  [place]          0.94       [place]              0.76         +-------+------------+-----------+----------------+------------+ Left    Unobtainable0.39       1.12 (Calcified)0.68         +-------+------------+-----------+----------------+------------+  Arterial wall calcification precludes accurate ankle pressures and ABIs.  Summary: Right: Resting right ankle-brachial index indicates noncompressible right lower extremity arteries. The right toe-brachial index is normal. Left: The left toe-brachial index is abnormal. Although pressures were not obtained secondary to patient pain in the setting of cellulitis, arterial doppler waveforms at the ankle suggest some component of arterial occlusive disease. Previous examinations appeared calcified/unreliable. *See table(s) above for measurements and observations.     Preliminary    ECHOCARDIOGRAM LIMITED  Result Date: 03/17/2022    ECHOCARDIOGRAM LIMITED REPORT   Patient Name:   EAGLE PITTA Date of Exam: 03/17/2022 Medical Rec #:  [id number]  Height:       70.0 in Accession #:    [id number] Weight:       215.0 lb Date of Birth:  06/05/1945  BSA:          2.152 m Patient Age:    28 years  BP:           133/68 mmHg Patient Gender: M          HR:           65 bpm. Exam Location:  Inpatient Procedure: Limited Echo, Limited Color Doppler and Color Doppler Indications:    other abnormalities of the heart  History:        Patient has prior history of Echocardiogram examinations, most                 recent 10/21/2021. CAD, chronic kidney disease. Sarcoidosis; Risk                 Factors:Sleep Apnea, Diabetes, Dyslipidemia and Hypertension.  Sonographer:    [place] Referring Phys: [id number] Iyanni Hepp T Bion Todorov IMPRESSIONS  1. Diffiicult acoustic windows, endocardium is difficult to see Lateral wall appears mildly hypokinetic. Would consider limited echo with Definity to help delineate. . Left ventricular ejection fraction, by estimation, is 50 to 55%. The left ventricle has low normal function. Left ventricular diastolic parameters are consistent with Grade I diastolic dysfunction (impaired  relaxation).  2. Right ventricular systolic function is low normal. The right ventricular size is normal.  3. Trivial mitral valve regurgitation.  4. The inferior vena cava is dilated in size with <50% respiratory variability, suggesting right atrial pressure of 15 mmHg. FINDINGS  Left Ventricle: Diffiicult acoustic windows, endocardium is difficult to see Lateral wall appears mildly hypokinetic. Would consider limited echo with Definity to help delineate. Left ventricular ejection fraction, by estimation, is 50 to 55%. The left ventricle has low normal function. The left ventricular internal cavity size was normal in size. There is no left ventricular hypertrophy. Left ventricular diastolic parameters are consistent with Grade I diastolic dysfunction (impaired relaxation). Right Ventricle: The right ventricular size is normal. Right ventricular systolic function is low normal. Pericardium: There is no evidence of pericardial effusion. Mitral Valve: There is mild thickening of the mitral valve leaflet(s). Mild mitral annular calcification. Trivial mitral valve regurgitation. Tricuspid Valve: The tricuspid valve is normal in structure. Tricuspid valve regurgitation is trivial. Pulmonic Valve: The pulmonic valve was normal in structure. Pulmonic valve regurgitation is mild. Venous: The inferior vena cava is dilated in size with less than 50% respiratory variability, suggesting right atrial pressure of 15 mmHg. Additional Comments: Spectral Doppler performed. Color Doppler performed.  LEFT VENTRICLE PLAX 2D LVIDd:         5.30 cm      Diastology LVIDs:         3.90 cm      LV e' medial:    4.57 cm/s LV PW:         1.00 cm      LV E/e' medial:  20.7 LV IVS:        0.90 cm      LV e' lateral:   5.77 cm/s LVOT diam:     2.00 cm      LV E/e' lateral: 16.4 LV SV:         58 LV SV Index:   27 LVOT Area:     3.14 cm  LV Volumes (MOD) LV vol d, MOD A4C: 109.0 ml LV vol s, MOD A4C: 60.4 ml LV SV MOD A4C:     109.0 ml IVC IVC  diam: 2.00 cm LEFT ATRIUM         Index LA diam:    3.80 cm 1.77 cm/m  AORTIC VALVE LVOT Vmax:  86.20 cm/s LVOT Vmean:  57.300 cm/s LVOT VTI:    0.186 m  AORTA Ao Asc diam: 3.00 cm MITRAL VALVE MV Area (PHT): 4.24 cm    SHUNTS MV Decel Time: 179 msec    Systemic VTI:  0.19 m MV E velocity: 94.50 cm/s  Systemic Diam: 2.00 cm MV A velocity: 91.90 cm/s MV E/A ratio:  1.03 Dorris Carnes MD Electronically signed by Dorris Carnes MD Signature Date/Time: 03/17/2022/9:09:39 AM    Final       Juanette Urizar T. [place]  If 7PM-7AM, please contact night-coverage [email or web address] 03/17/2022, 1:39 PM

## 2022-03-17 NOTE — Progress Notes (Signed)
Initial Nutrition Assessment  DOCUMENTATION CODES:   Not applicable  INTERVENTION:  Glucerna Shake po TID, each supplement provides 220 kcal and 10 grams of protein MVI with minerals daily  NUTRITION DIAGNOSIS:   Increased nutrient needs related to acute illness as evidenced by estimated needs.  GOAL:   Patient will meet greater than or equal to 90% of their needs  MONITOR:   Weight trends, PO intake, Supplement acceptance, Labs, Skin  REASON FOR ASSESSMENT:   Malnutrition Screening Tool    ASSESSMENT:   Pt admitted from home with c/o weakness, n/v, body aches, fever and cough found to have severe sepsis 2/2 LLE cellulitis. PMH significant for diabetes, CKD 3, PAD, hypothyroidism, HLD, HTN, CAD, adrenal insufficiency, sarcoidosis, anemia, GERD, diastolic CHF  Vascular Surgery consulted- no limb threatening features on evaluation. Plans for OP follow up.    Pt states that his appetite is getting better today. He ate breakfast potatoes and a pancake for breakfast and ate enough to feel full for lunch. States that he was eating well at home up until he became symptomatic on Monday and was experiencing emesis. Breakfast was first meal and are pancake, breakfast potatoes. Had some of his lunch.   Pt reports that he has had a fall and multiple recurrent episodes of PNA within the last 2 years but does not feel as though his appetite has been affected. He takes care of his 60 year old grandson and they love to travel. He goes to bed at 2A and gets up around 12 P and goes to pick up his grandson. He endorses typically eating 2 meals per day.   Pt states that his weight was about 235 lbs and has lost about 20 lbs within the last 6 months. Reviewed weight history. Pt's weight on 02/03/22 was noted to be 101.2 kg. Current admit weight is 97.5 kg. Uncertain whether admit weight is stated or actual.   Medications: SSI 0-20 units TID, SSI 0-5 units qhs, SSI 6 units TID, semglee 20 units BID,  protonix, prednisone, IV abx  Labs: sodium 133, BUN 37, Cr 1.70, GFR 41, HgbA1c 10.0% (10/04/21), CBG's 194-313 x24 hours  NUTRITION - FOCUSED PHYSICAL EXAM:  Flowsheet Row Most Recent Value  Orbital Region No depletion  Upper Arm Region Moderate depletion  Thoracic and Lumbar Region Mild depletion  Buccal Region No depletion  Temple Region No depletion  Clavicle Bone Region No depletion  Clavicle and Acromion Bone Region No depletion  Scapular Bone Region No depletion  Dorsal Hand Mild depletion  Patellar Region Moderate depletion  Anterior Thigh Region Moderate depletion  Posterior Calf Region Mild depletion  Edema (RD Assessment) Mild  [LLE]  Hair Reviewed  Eyes Reviewed  Mouth Reviewed  Skin Other (Comment)  [very dry, flaky]  Nails Reviewed       Diet Order:   Diet Order             Diet heart healthy/carb modified Room service appropriate? Yes; Fluid consistency: Thin  Diet effective now                   EDUCATION NEEDS:   Education needs have been addressed  Skin:  Skin Assessment: Reviewed RN Assessment  Last BM:  unknown  Height:   Ht Readings from Last 1 Encounters:  03/15/22 '5\' 10"'$  (1.778 m)    Weight:   Wt Readings from Last 1 Encounters:  03/15/22 97.5 kg   BMI:  Body mass index is 30.85 kg/m.  Estimated Nutritional Needs:   Kcal:  2100-2300  Protein:  125-140g  Fluid:  >/=2L  Clayborne Dana, RDN, LDN Clinical Nutrition

## 2022-03-17 NOTE — Progress Notes (Signed)
  Echocardiogram 2D Echocardiogram has been performed.  [name] 03/17/2022, 8:38 AM

## 2022-03-17 NOTE — Progress Notes (Signed)
Pt refused cpap

## 2022-03-17 NOTE — Consult Note (Signed)
[facility] [place]  ASSESSMENT / PLAN: [name] is a 77 y.o. male with atherosclerosis of native arteries of left lower extremity. Patient admitted to hospital for severe sepsis; left lower extremity cellulitis which appears to be improving with antibiotics. No limb threatening features seen today on evaluation.   Recommend:  Complete cessation from all tobacco products. Blood glucose control with goal A1c < 7%. Blood pressure control with goal blood pressure < 140/90 mmHg. Lipid reduction therapy with goal LDL-C <100 mg/dL (<70 if symptomatic from PAD).  Aspirin '81mg'$  PO QD.  Atorvastatin 40-'80mg'$  PO QD (or other "high intensity" statin therapy).  Follow up with me in 4 weeks to begin surveillance of lower extremity peripheral arterial disease. Please call for any questions.   CHIEF COMPLAINT: abnormal ABI  HISTORY OF PRESENT ILLNESS: [name] is a 77 y.o. male admitted to the internal medicine service for severe sepsis.  The patient has multiple medical comorbidities see below for details).  Patient presented to the ER febrile to 103 degrees with tachycardia and tachypnea.  He had a profound leukocytosis and mild acute kidney injury.  He was started on empiric antibiotics for left lower extremity cellulitis and possible pneumonia.  An ankle-brachial index was performed and showed low left great toe pressure.  On my evaluation the patient has no left lower extremity complaints.  He does not walk fast or far enough to claudicate.  He does not describe rest pain in his feet.  He has no ulcers about his feet.  Past Medical History:  Diagnosis Date   Adrenal insufficiency ([place])    Allergy    Anemia    Arthritis    feet    Broken foot 09/2019   had to wore a boot. Left    CKD (chronic kidney disease), stage III (HCC)    Coronary artery disease    has stents   Coronary atherosclerosis of native coronary artery    Proximal LAD, posterior lateral stent widely  patent-10/12/11   Diabetes mellitus    insulin and pills   Hearing loss    wears hearing aids   History of blood transfusion 06/30/2016   [facility] - 2 units transfused   Hyperlipidemia    Hypertension    OSA (obstructive sleep apnea)    uses VPAC sleep study 2 years done through [facility]. Dr. Marlou Porch arranged study   PVD (peripheral vascular disease) ([place])    Sarcoidosis    Sleep apnea    uses CPAP nightly   Thyroid disease    Type 2 diabetes mellitus ([place])     Past Surgical History:  Procedure Laterality Date   APPENDECTOMY     CATARACT EXTRACTION  2011   bilat   CHOLECYSTECTOMY  01/25/2011   Procedure: LAPAROSCOPIC CHOLECYSTECTOMY WITH INTRAOPERATIVE CHOLANGIOGRAM;  Surgeon: Judieth Keens, DO;  Location: [facility];  Service: General;  Laterality: N/A;   COLONOSCOPY     several   COLONOSCOPY WITH PROPOFOL N/A 01/03/2020   Procedure: COLONOSCOPY WITH PROPOFOL;  Surgeon: Gatha Mayer, MD;  Location: [facility];  Service: Endoscopy;  Laterality: N/A;   CORONARY ANGIOPLASTY     most recent 11/2009   CORONARY STENT INTERVENTION N/A 08/07/2018   Procedure: CORONARY STENT INTERVENTION;  Surgeon: Jettie Booze, MD;  Location: [place] CV [facility];  Service: Cardiovascular;  Laterality: N/A;   CORONARY STENT INTERVENTION N/A 01/18/2021   Procedure: CORONARY STENT INTERVENTION;  Surgeon: Sherren Mocha, MD;  [facility]: [facility]  INVASIVE CV LAB;  Service: Cardiovascular;  Laterality: N/A;   CORONARY STENT PLACEMENT  2009   in LAD and side branch PTCA   ESOPHAGOGASTRODUODENOSCOPY (EGD) WITH PROPOFOL N/A 01/03/2020   Procedure: ESOPHAGOGASTRODUODENOSCOPY (EGD) WITH PROPOFOL;  Surgeon: Gatha Mayer, MD;  Location: [facility];  Service: Endoscopy;  Laterality: N/A;   HEMOSTASIS CLIP PLACEMENT  01/03/2020   Procedure: HEMOSTASIS CLIP PLACEMENT;  Surgeon: Gatha Mayer, MD;  Location: [facility];  Service: Endoscopy;;   INTERCOSTAL NERVE BLOCK  2011, 06/2016   x2. lumbar spine    INTRAVASCULAR PRESSURE WIRE/FFR STUDY N/A 01/18/2021   Procedure: INTRAVASCULAR PRESSURE WIRE/FFR STUDY;  Surgeon: Sherren Mocha, MD;  Location: [place] CV LAB;  Service: Cardiovascular;  Laterality: N/A;   LEFT HEART CATHETERIZATION WITH CORONARY ANGIOGRAM Bilateral 10/12/2011   Procedure: LEFT HEART CATHETERIZATION WITH CORONARY ANGIOGRAM;  Surgeon: Candee Furbish, MD;  Location: [facility] CATH LAB;  Service: Cardiovascular;  Laterality: Bilateral;   LEFT HEART CATHETERIZATION WITH CORONARY ANGIOGRAM N/A 04/01/2014   Procedure: LEFT HEART CATHETERIZATION WITH CORONARY ANGIOGRAM;  Surgeon: Candee Furbish, MD;  Location: [facility] CATH LAB;  Service: Cardiovascular;  Laterality: N/A;   POLYPECTOMY  01/03/2020   Procedure: POLYPECTOMY;  Surgeon: Gatha Mayer, MD;  [facility];  Service: Endoscopy;;   RIGHT/LEFT HEART CATH AND CORONARY ANGIOGRAPHY N/A 08/07/2018   Procedure: RIGHT/LEFT HEART CATH AND CORONARY ANGIOGRAPHY;  Surgeon: Jettie Booze, MD;  Location: [place] CV LAB;  Service: Cardiovascular;  Laterality: N/A;   RIGHT/LEFT HEART CATH AND CORONARY ANGIOGRAPHY N/A 12/25/2020   Procedure: RIGHT/LEFT HEART CATH AND CORONARY ANGIOGRAPHY;  Surgeon: Belva Crome, MD;  Location: [place] CV LAB;  Service: Cardiovascular;  Laterality: N/A;   TRANSFORAMINAL LUMBAR INTERBODY FUSION W/ MIS 1 LEVEL N/A 09/24/2021   Procedure: LUMBAR FOUR-FIVE MINIMALLY INVASIVE (MIS) TRANSFORAMINAL LUMBAR INTERBODY FUSION (TLIF) WITH METRX;  Surgeon: Judith Part, MD;  Location: [place];  Service: Neurosurgery;  Laterality: N/A;    Family History  Problem Relation Age of Onset   Kidney disease Mother    Diabetes Mother    Kidney cancer Mother    Heart attack Father    Asthma Sister    Anesthesia problems Sister        "Kidney's did not wake up"   Sarcoidosis Sister    Sarcoidosis Niece    Colon cancer Neg Hx    Colon polyps Neg Hx    Rectal cancer Neg Hx    Stomach cancer Neg Hx     Social  History   Socioeconomic History   Marital status: Married    Spouse name: Not on file   Number of children: 3   Years of education: Not on file   Highest education level: Not on file  Occupational History   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   Occupation: MANAGER    Employer: [employer]  Tobacco Use   Smoking status: Never   Smokeless tobacco: Never  Vaping Use   Vaping Use: Never used  Substance and Sexual Activity   Alcohol use: No   Drug use: No   Sexual activity: Not on file  Other Topics Concern   Not on file  Social History Narrative   Not on file   Social Determinants of Health   Financial Resource Strain: Not on file  Food Insecurity: No Food Insecurity (10/21/2021)   Hunger Vital Sign    Worried About Running Out of Food in the Last Year: Never  true    Ran Out of Food in the Last Year: Never true  Transportation Needs: No Transportation Needs (10/21/2021)   PRAPARE - Hydrologist (Medical): No    Lack of Transportation (Non-Medical): No  Physical Activity: Not on file  Stress: Not on file  Social Connections: Not on file  Intimate Partner Violence: Not on file    Allergies  Allergen Reactions   Statins Other (See Comments)    Aggression  Pt ok to take crestor '20mg'$  once a week   Hydrocortisone Nausea Only   Ranexa [Ranolazine] Other (See Comments)    Severe weakness/near syncope after 1 dose Hallucinations    Hydrocodone Nausea And Vomiting   Miralax [Polyethylene Glycol] Nausea And Vomiting   Z-Pak [Azithromycin] Other (See Comments)    Raises blood sugar   Zaroxolyn [Metolazone] Other (See Comments)    Drained, no energy    Current Facility-Administered Medications  Medication Dose Route Frequency Provider Last Rate Last Admin   acetaminophen (TYLENOL) tablet 650 mg  650 mg Oral Q6H PRN Marcelyn Bruins, MD       Or   acetaminophen (TYLENOL) suppository 650 mg  650 mg Rectal Q6H PRN Marcelyn Bruins, MD       albuterol (PROVENTIL) (2.5 MG/3ML) 0.083% nebulizer solution 2.5 mg  2.5 mg Nebulization Q6H PRN Marcelyn Bruins, MD       aspirin EC tablet 81 mg  81 mg Oral QPM Gonfa, Taye T, MD   81 mg at 03/16/22 1721   ceFAZolin (ANCEF) IVPB 2g/100 mL premix  2 g Intravenous Q12H Wendee Beavers T, MD 200 mL/hr at 03/17/22 1417 2 g at 03/17/22 1417   enoxaparin (LOVENOX) injection 40 mg  40 mg Subcutaneous Q24H Marcelyn Bruins, MD   40 mg at 03/16/22 1721   insulin aspart (novoLOG) injection 0-20 Units  0-20 Units Subcutaneous TID WC Gonfa, Taye T, MD       insulin aspart (novoLOG) injection 0-5 Units  0-5 Units Subcutaneous QHS Gonfa, Taye T, MD       insulin aspart (novoLOG) injection 6 Units  6 Units Subcutaneous TID WC Wendee Beavers T, MD   6 Units at 03/17/22 1236   insulin glargine-yfgn (SEMGLEE) injection 20 Units  20 Units Subcutaneous BID Wendee Beavers T, MD       levothyroxine (SYNTHROID) tablet 75 mcg  75 mcg Oral Q0600 Marcelyn Bruins, MD   75 mcg at 03/17/22 0535   metoprolol tartrate (LOPRESSOR) tablet 12.5 mg  12.5 mg Oral BID Marcelyn Bruins, MD   12.5 mg at 03/17/22 0940   montelukast (SINGULAIR) tablet 10 mg  10 mg Oral Daily PRN Wendee Beavers T, MD       ondansetron (ZOFRAN) injection 4 mg  4 mg Intravenous Q6H PRN Kristopher Oppenheim, DO   4 mg at 03/16/22 0351   pantoprazole (PROTONIX) EC tablet 40 mg  40 mg Oral Daily Marcelyn Bruins, MD   40 mg at 03/17/22 0940   [START ON 03/18/2022] predniSONE (DELTASONE) tablet 10 mg  10 mg Oral Q breakfast Wendee Beavers T, MD       rosuvastatin (CRESTOR) tablet 20 mg  20 mg Oral QHS Marcelyn Bruins, MD   20 mg at 03/16/22 2052   senna-docusate (Senokot-S) tablet 1 tablet  1 tablet Oral QHS PRN Marcelyn Bruins, MD       sodium chloride flush (NS) 0.9 % injection 3 mL  3 mL Intravenous Q12H Marcelyn Bruins, MD   3 mL at 03/17/22 0941    PHYSICAL EXAM Vitals:   03/16/22 2043 03/16/22 2358 03/17/22 0313 03/17/22 0800   BP: 120/65  133/68 139/71  Pulse: 74 72 66 66  Resp: '16 18 18 16  '$ Temp: 98.8 F (37.1 C)  98 F (36.7 C) 98.3 F (36.8 C)  TempSrc: Oral   Oral  SpO2: 94% 94% 94% 96%  Weight:      Height:       Elderly, chronically ill man in no acute distress Regular rate and rhythm Unlabored breathing Left lower extremity edematous 2+ from midfoot to knee. Eczematous skin changes across both lower extremities No palpable pedal pulses in either lower extremity  PERTINENT LABORATORY AND RADIOLOGIC DATA  Most recent CBC    Latest Ref Rng & Units 03/17/2022    3:17 AM 03/16/2022    4:58 AM 03/15/2022   12:25 PM  CBC  WBC 4.0 - 10.5 K/uL 26.6  32.2  34.3   Hemoglobin 13.0 - 17.0 g/dL 10.9  12.1  14.0   Hematocrit 39.0 - 52.0 % 35.0  38.7  45.8   Platelets 150 - 400 K/uL 208  243  305      Most recent CMP    Latest Ref Rng & Units 03/17/2022    3:17 AM 03/16/2022    4:58 AM 03/15/2022    7:30 PM  CMP  Glucose 70 - 99 mg/dL 297  313    BUN 8 - 23 mg/dL 37  27    Creatinine 0.61 - 1.24 mg/dL 1.70  1.51    Sodium 135 - 145 mmol/L 133  134    Potassium 3.5 - 5.1 mmol/L 4.1  4.0  3.7   Chloride 98 - 111 mmol/L 99  97    CO2 22 - 32 mmol/L 24  25    Calcium 8.9 - 10.3 mg/dL 8.1  7.8    Total Protein 6.5 - 8.1 g/dL 5.7  5.5    Total Bilirubin 0.3 - 1.2 mg/dL 0.7  0.7    Alkaline Phos 38 - 126 U/L 63  54    AST 15 - 41 U/L 32  46    ALT 0 - 44 U/L 31  38      Renal function Estimated Creatinine Clearance: 43.3 mL/min (A) (by C-G formula based on SCr of 1.7 mg/dL (H)).  Hgb A1c MFr Bld (%)  Date Value  10/04/2021 10.0 (H)    LDL Chol Calc (NIH)  Date Value Ref Range Status  01/26/2021 76 0 - 99 mg/dL Final     +-------+------------+-----------+----------------+------------+  ABI/TBIToday's ABI Today's TBIPrevious ABI    Previous TBI  +-------+------------+-----------+----------------+------------+  Right [place]          0.94                     0.76           +-------+------------+-----------+----------------+------------+  Left  Unobtainable0.39       1.12 (Calcified)0.68          +-------+------------+-----------+----------------+------------+    Yevonne Aline. Stanford Breed, MD FACS [facility] Phone Number: [phone] 03/17/2022 3:12 PM   Total time spent on preparing this encounter including chart review, data review, collecting history, examining the patient, coordinating care for this new patient, 60 minutes.  Portions of this report may have been transcribed using voice recognition software.  Every  effort has been made to ensure accuracy; however, inadvertent computerized transcription errors may still be present.

## 2022-03-17 NOTE — Evaluation (Signed)
Occupational Therapy Evaluation Patient Details Name: [name] MRN: [id number] DOB: 11/12/1945 Today's Date: 03/17/2022   History of Present Illness 77 year old M adm 1/9 with PMH of CAD, diastolic CHF, OSA not on CPAP, adrenal insufficiency on prednisone, sarcoidosis, DM-2, CKD-3, PAD, HTN, hypothyroidism, anemia, obesity and GERD presenting with generalized weakness and admitted for severe sepsis in the setting of cellulitis, possible pneumonia.   Clinical Impression   Patient admitted for the diagnoses above.  PTA he was living at home with his spouse, had recently started walking without an AD after his back surgery, did not need assist with ADL, and participated with some iADL. Primary deficit is cellulitis related pain to his L foot with weight bearing.  OT will follow in the acute setting to address deficits and maximize his functional status for an eventual return home.       Recommendations for follow up therapy are one component of a multi-disciplinary discharge planning process, led by the attending physician.  Recommendations may be updated based on patient status, additional functional criteria and insurance authorization.   Follow Up Recommendations  No OT follow up     Assistance Recommended at Discharge Set up Supervision/Assistance  Patient can return home with the following Assist for transportation    Functional Status Assessment  Patient has had a recent decline in their functional status and demonstrates the ability to make significant improvements in function in a reasonable and predictable amount of time.  Equipment Recommendations  None recommended by OT    Recommendations for Other Services       Precautions / Restrictions Precautions Precautions: Fall Restrictions Weight Bearing Restrictions: No      Mobility Bed Mobility               General bed mobility comments: sitting edge of bed upon arrival    Transfers Overall transfer level:  Needs assistance Equipment used: Rolling walker (2 wheels) Transfers: Sit to/from Stand, Bed to chair/wheelchair/BSC Sit to Stand: Min guard     Step pivot transfers: Supervision     General transfer comment: cues to pust from bed, and not pull from the RW      Balance Overall balance assessment: Needs assistance Sitting-balance support: Feet supported Sitting balance-Leahy Scale: Good     Standing balance support: Reliant on assistive device for balance Standing balance-Leahy Scale: Fair                             ADL either performed or assessed with clinical judgement   ADL       Grooming: Wash/dry hands;Min guard;Standing           Upper Body Dressing : Set up;Sitting   Lower Body Dressing: Min guard;Sit to/from stand   Toilet Transfer: Supervision/safety;Regular Toilet;Rolling walker (2 wheels)                   Vision Baseline Vision/History: 1 Wears glasses Patient Visual Report: No change from baseline       Perception     Praxis      Pertinent Vitals/Pain Pain Assessment Pain Assessment: Faces Faces Pain Scale: Hurts little more Pain Location: L foot with weight bearing Pain Descriptors / Indicators: Tender Pain Intervention(s): Monitored during session     Hand Dominance Right   Extremity/Trunk Assessment Upper Extremity Assessment Upper Extremity Assessment: Overall WFL for tasks assessed   Lower Extremity Assessment Lower Extremity Assessment: Defer to PT evaluation  Cervical / Trunk Assessment Cervical / Trunk Assessment: Kyphotic   Communication Communication Communication: No difficulties   Cognition Arousal/Alertness: Awake/alert Behavior During Therapy: WFL for tasks assessed/performed Overall Cognitive Status: Within Functional Limits for tasks assessed                                       General Comments   VSS on RA    Exercises     Shoulder Instructions      Home Living  Family/patient expects to be discharged to:: Private residence Living Arrangements: Spouse/significant other Available Help at Discharge: Family;Available 24 hours/day Type of Home: House Home Access: Ramped entrance     Home Layout: One level     Bathroom Shower/Tub: Occupational psychologist: Handicapped height Bathroom Accessibility: Yes How Accessible: Accessible via walker Home Equipment: [place] (2 wheels);Rollator (4 wheels);Electric scooter;Wheelchair - manual;BSC/3in1;Grab bars - toilet;Grab bars - tub/shower;Hand held shower head;Shower seat - built in;Shower seat          Prior Functioning/Environment               Mobility Comments: Primarily was using 4WRW, but had recently began walking without an AD. ADLs Comments: Pt independent with bathing and dressing (set up only from wife for dressing). Pt not driving and is retired. Pt's wife taking care of housework and cooking etc.        OT Problem List: Impaired balance (sitting and/or standing);Pain;Decreased strength      OT Treatment/Interventions: Self-care/ADL training;Therapeutic activities;Patient/family education;Balance training;DME and/or AE instruction    OT Goals(Current goals can be found in the care plan section) Acute Rehab OT Goals Patient Stated Goal: Return home OT Goal Formulation: With patient Time For Goal Achievement: 03/31/22 Potential to Achieve Goals: Good ADL Goals Pt Will Perform Grooming: with modified independence;standing Pt Will Perform Lower Body Dressing: with modified independence;sit to/from stand Pt Will Transfer to Toilet: with modified independence;regular height toilet;ambulating  OT Frequency: Min 2X/week    Co-evaluation              AM-PAC OT "6 Clicks" Daily Activity     Outcome Measure Help from another person eating meals?: None Help from another person taking care of personal grooming?: None Help from another person toileting, which  includes using toliet, bedpan, or urinal?: A Little Help from another person bathing (including washing, rinsing, drying)?: A Little Help from another person to put on and taking off regular upper body clothing?: None Help from another person to put on and taking off regular lower body clothing?: A Little 6 Click Score: 21   End of Session Equipment Utilized During Treatment: Rolling walker (2 wheels) Nurse Communication: Mobility status  Activity Tolerance: Patient tolerated treatment well Patient left: in bed;with call bell/phone within reach;with family/visitor present  OT Visit Diagnosis: Unsteadiness on feet (R26.81);Pain Pain - Right/Left: Left Pain - part of body: Ankle and joints of foot                Time: 1600-1620 OT Time Calculation (min): 20 min Charges:  OT General Charges $OT Visit: 1 Visit OT Evaluation $OT Eval Moderate Complexity: 1 Mod  03/17/2022  RP, OTR/L  Acute Rehabilitation Services  Office:  [phone]   Metta Clines 03/17/2022, 4:28 PM

## 2022-03-18 DIAGNOSIS — A419 Sepsis, unspecified organism: Secondary | ICD-10-CM | POA: Diagnosis not present

## 2022-03-18 DIAGNOSIS — R6520 Severe sepsis without septic shock: Secondary | ICD-10-CM | POA: Diagnosis not present

## 2022-03-18 LAB — RENAL FUNCTION PANEL
Albumin: 2.2 g/dL — ABNORMAL LOW (ref 3.5–5.0)
Anion gap: 11 (ref 5–15)
BUN: 30 mg/dL — ABNORMAL HIGH (ref 8–23)
CO2: 25 mmol/L (ref 22–32)
Calcium: 8.2 mg/dL — ABNORMAL LOW (ref 8.9–10.3)
Chloride: 99 mmol/L (ref 98–111)
Creatinine, Ser: 1.58 mg/dL — ABNORMAL HIGH (ref 0.61–1.24)
GFR, Estimated: 45 mL/min — ABNORMAL LOW (ref >60)
Glucose, Bld: 198 mg/dL — ABNORMAL HIGH (ref 70–99)
Phosphorus: 2.1 mg/dL — ABNORMAL LOW (ref 2.5–4.6)
Potassium: 3.2 mmol/L — ABNORMAL LOW (ref 3.5–5.1)
Sodium: 135 mmol/L (ref 135–145)

## 2022-03-18 LAB — LIPID PANEL
Cholesterol: 87 mg/dL (ref 0–200)
HDL: 20 mg/dL — ABNORMAL LOW (ref >40)
LDL Cholesterol: 33 mg/dL (ref 0–99)
Total CHOL/HDL Ratio: 4.4 RATIO
Triglycerides: 172 mg/dL — ABNORMAL HIGH (ref <150)
VLDL: 34 mg/dL (ref 0–40)

## 2022-03-18 LAB — CBC
HCT: 37.5 % — ABNORMAL LOW (ref 39.0–52.0)
Hemoglobin: 11.4 g/dL — ABNORMAL LOW (ref 13.0–17.0)
MCH: 27.6 pg (ref 26.0–34.0)
MCHC: 30.4 g/dL (ref 30.0–36.0)
MCV: 90.8 fL (ref 80.0–100.0)
Platelets: 220 10*3/uL (ref 150–400)
RBC: 4.13 MIL/uL — ABNORMAL LOW (ref 4.22–5.81)
RDW: 19.3 % — ABNORMAL HIGH (ref 11.5–15.5)
WBC: 14.9 10*3/uL — ABNORMAL HIGH (ref 4.0–10.5)
nRBC: 0 % (ref 0.0–0.2)

## 2022-03-18 LAB — PROCALCITONIN: Procalcitonin: 8.8 ng/mL

## 2022-03-18 LAB — BRAIN NATRIURETIC PEPTIDE: B Natriuretic Peptide: 337.5 pg/mL — ABNORMAL HIGH (ref 0.0–100.0)

## 2022-03-18 LAB — GLUCOSE, CAPILLARY
Glucose-Capillary: 210 mg/dL — ABNORMAL HIGH (ref 70–99)
Glucose-Capillary: 282 mg/dL — ABNORMAL HIGH (ref 70–99)
Glucose-Capillary: 310 mg/dL — ABNORMAL HIGH (ref 70–99)
Glucose-Capillary: 273 mg/dL — ABNORMAL HIGH (ref 70–99)

## 2022-03-18 LAB — HEMOGLOBIN A1C
Hgb A1c MFr Bld: 9.4 % — ABNORMAL HIGH (ref 4.8–5.6)
Mean Plasma Glucose: 223 mg/dL

## 2022-03-18 LAB — MAGNESIUM: Magnesium: 2.1 mg/dL (ref 1.7–2.4)

## 2022-03-18 MED ORDER — METOPROLOL TARTRATE 5 MG/5ML IV SOLN: 5.0000 mg | INTRAVENOUS | Status: DC | PRN

## 2022-03-18 MED ORDER — GUAIFENESIN 100 MG/5ML PO LIQD: 5.0000 mL | ORAL | Status: DC | PRN

## 2022-03-18 MED ORDER — CEFAZOLIN SODIUM-DEXTROSE 2-4 GM/100ML-% IV SOLN
2.0000 g | Freq: Three times a day (TID) | INTRAVENOUS | Status: DC
  Administered 2022-03-18 – 2022-03-21 (×9): 2 g via INTRAVENOUS
  Filled 2022-03-18 (×9): qty 100

## 2022-03-18 MED ORDER — TRAZODONE HCL 50 MG PO TABS: 50.0000 mg | ORAL_TABLET | Freq: Every evening | ORAL | Status: DC | PRN

## 2022-03-18 MED ORDER — HYDRALAZINE HCL 20 MG/ML IJ SOLN
10.0000 mg | INTRAMUSCULAR | Status: DC | PRN
  Administered 2022-03-20 (×2): 10 mg via INTRAVENOUS
  Filled 2022-03-18 (×2): qty 1

## 2022-03-18 MED ORDER — POTASSIUM CHLORIDE CRYS ER 20 MEQ PO TBCR
40.0000 meq | EXTENDED_RELEASE_TABLET | Freq: Once | ORAL | Status: AC
  Administered 2022-03-18: 40 meq via ORAL
  Filled 2022-03-18: qty 2

## 2022-03-18 MED ORDER — CLOPIDOGREL BISULFATE 75 MG PO TABS
75.0000 mg | ORAL_TABLET | Freq: Every day | ORAL | Status: DC
  Administered 2022-03-18 – 2022-03-21 (×4): 75 mg via ORAL
  Filled 2022-03-18 (×4): qty 1

## 2022-03-18 MED ORDER — SODIUM CHLORIDE 0.9 % IV SOLN: INTRAVENOUS | Status: DC

## 2022-03-18 NOTE — Plan of Care (Signed)

## 2022-03-18 NOTE — Care Management Important Message (Signed)
Important Message  Patient Details  Name: [name] MRN: [id number] Date of Birth: Feb 20, 1946   Medicare Important Message Given:  Yes     [name] [name] 03/18/2022, 2:15 PM

## 2022-03-18 NOTE — Evaluation (Signed)
Physical Therapy Evaluation Patient Details Name: [name] MRN: [id number] DOB: 08-17-1945 Today's Date: 03/18/2022  History of Present Illness  Pt is 77 yo male admitted 03/15/22 with sepsis due to L LE cellulitis. Pt with hx including CAD, CHF, OSA, adrenal insufficiency, sarcoidosis, DM, CKD, PAD, HTN, hypothyroidism, anemia, obesity, and GERD  Clinical Impression  Pt admitted with above diagnosis. At baseline, pt independent at home.  He typically uses rollator in community but no AD in home.  Reports back surgery 7/23 and was not able to get HHPT b/c readmissions back to hospital then [name] had COVID.  Interested in [place] at d/c.  Today, pt able to ambulate 180' x with RW and supervision.  He was able to tolerate weight on L LE but did fatigue easier than baseline.  Pt expected to progress well.  Pt safe to transfer in room if not connected to IV. Pt currently with functional limitations due to the deficits listed below (see PT Problem List). Pt will benefit from skilled PT to increase their independence and safety with mobility to allow discharge to the venue listed below.          Recommendations for follow up therapy are one component of a multi-disciplinary discharge planning process, led by the attending physician.  Recommendations may be updated based on patient status, additional functional criteria and insurance authorization.  Follow Up Recommendations Home health PT      Assistance Recommended at Discharge Intermittent Supervision/Assistance  Patient can return home with the following  A little help with walking and/or transfers;A little help with bathing/dressing/bathroom;Assistance with cooking/housework;Help with stairs or ramp for entrance    Equipment Recommendations Other (comment);None recommended by PT  Recommendations for Other Services       Functional Status Assessment Patient has had a recent decline in their functional status and demonstrates the ability to make  significant improvements in function in a reasonable and predictable amount of time.     Precautions / Restrictions Precautions Precautions: Fall      Mobility  Bed Mobility Overal bed mobility: Needs Assistance Bed Mobility: Supine to Sit, Sit to Supine     Supine to sit: Supervision Sit to supine: Supervision        Transfers Overall transfer level: Needs assistance Equipment used: Rolling walker (2 wheels) Transfers: Sit to/from Stand Sit to Stand: Supervision           General transfer comment: performed x 3    Ambulation/Gait Ambulation/Gait assistance: Supervision Gait Distance (Feet): 180 Feet (180'x2) Assistive device: Rolling walker (2 wheels) Gait Pattern/deviations: WFL(Within Functional Limits) Gait velocity: decreased     General Gait Details: min cues for RW proximity and posture  Stairs            Wheelchair Mobility    Modified Rankin (Stroke Patients Only)       Balance Overall balance assessment: Needs assistance Sitting-balance support: Feet supported Sitting balance-Leahy Scale: Good     Standing balance support: Bilateral upper extremity supported, No upper extremity supported Standing balance-Leahy Scale: Fair Standing balance comment: RW to ambulate; no AD static                             Pertinent Vitals/Pain      Home Living Family/patient expects to be discharged to:: Private residence Living Arrangements: Spouse/significant other Available Help at Discharge: Family;Available 24 hours/day Type of Home: House Home Access: Ramped entrance  Home Layout: One level Home Equipment: Conservation officer, nature (2 wheels);Rollator (4 wheels);Electric scooter;Wheelchair - manual;BSC/3in1;Grab bars - toilet;Grab bars - tub/shower;Hand held shower head;Shower seat - built in;Shower seat;Hospital bed      Prior Function Prior Level of Function : Needs assist             Mobility Comments: Used cane or no AD  at home and rollator in community for rest breaks ADLs Comments: Pt independent with ADLs and shared IADLs with [name]     Hand Dominance   Dominant Hand: Right    Extremity/Trunk Assessment   Upper Extremity Assessment Upper Extremity Assessment: Overall WFL for tasks assessed    Lower Extremity Assessment Lower Extremity Assessment: Overall WFL for tasks assessed (limited MMT on L LE due to pain)    Cervical / Trunk Assessment Cervical / Trunk Assessment: Normal  Communication   Communication: No difficulties  Cognition                                                General Comments General comments (skin integrity, edema, etc.): VSS    Exercises     Assessment/Plan    PT Assessment Patient needs continued PT services  PT Problem List Decreased strength;Cardiopulmonary status limiting activity;Decreased activity tolerance;Decreased knowledge of use of DME;Decreased balance;Decreased mobility;Decreased knowledge of precautions       PT Treatment Interventions DME instruction;Therapeutic exercise;Gait training;Balance training;Stair training;Functional mobility training;Therapeutic activities;Patient/family education    PT Goals (Current goals can be found in the Care Plan section)  Acute Rehab PT Goals Patient Stated Goal: return home PT Goal Formulation: With patient/family Time For Goal Achievement: 04/01/22 Potential to Achieve Goals: Good    Frequency Min 3X/week     Co-evaluation               AM-PAC PT "6 Clicks" Mobility  Outcome Measure Help needed turning from your back to your side while in a flat bed without using bedrails?: A Little Help needed moving from lying on your back to sitting on the side of a flat bed without using bedrails?: A Little Help needed moving to and from a bed to a chair (including a wheelchair)?: A Little Help needed standing up from a chair using your arms (e.g., wheelchair or bedside chair)?: A  Little Help needed to walk in hospital room?: A Little Help needed climbing 3-5 steps with a railing? : A Little 6 Click Score: 18    End of Session Equipment Utilized During Treatment: Gait belt Activity Tolerance: Patient tolerated treatment well Patient left: in chair;with call bell/phone within reach Nurse Communication: Mobility status PT Visit Diagnosis: Other abnormalities of gait and mobility (R26.89);Muscle weakness (generalized) (M62.81)    Time: 1443-1540 PT Time Calculation (min) (ACUTE ONLY): 34 min   Charges:   PT Evaluation $PT Eval Low Complexity: 1 Low PT Treatments $Gait Training: 8-22 mins        Abran Richard, PT Acute Rehab [facility] [phone]   Karlton Lemon 03/18/2022, 3:31 PM

## 2022-03-18 NOTE — Progress Notes (Signed)
PHARMACY NOTE:  ANTIMICROBIAL RENAL DOSAGE ADJUSTMENT  Current antimicrobial regimen includes a mismatch between antimicrobial dosage and estimated renal function.  As per policy approved by the Pharmacy & Therapeutics and Medical Executive Committees, the antimicrobial dosage will be adjusted accordingly.  Current antimicrobial dosage:  Cefazolin 2g IV q12  Indication: Cellulitis/PNA  Renal Function:  Estimated Creatinine Clearance: 46.6 mL/min (A) (by C-G formula based on SCr of 1.58 mg/dL (H)). '[]'$      On intermittent HD, scheduled: '[]'$      On CRRT    Antimicrobial dosage has been changed to:  cefazolin 2g IV q8  Additional comments:   Onnie Boer, PharmD, BCIDP, AAHIVP, CPP Infectious Disease Pharmacist 03/18/2022 2:14 PM

## 2022-03-18 NOTE — Progress Notes (Signed)
Pt declined CPAP at this time stating he is unable to tolerate. CPAP pulled from pt room at this time.

## 2022-03-18 NOTE — TOC Initial Note (Signed)
Transition of Care [facility]) - Initial/Assessment Note    Patient Details  Name: [name] MRN: [id number] Date of Birth: 1945-11-22  Transition of Care [facility]) CM/SW Contact:    Curlene Labrum, RN Phone Number: 03/18/2022, 9:39 AM  Clinical Narrative:                 CM spoke with the patient and wife at the bedside.  The patient states that he needs a Wheelchair lift for his vehicle.  I explained to the patient that [facility] does not cover for this equipment.  The patient's wife was provided with a number to [facility] to speak with a case manager with the insurance company if needed.  But otherwise, the patient would need to order and privately arrange for this equipment at a later date and company of choice.  TOC CM will continue to follow for needs - No needs determined at this time.  Expected Discharge Plan: Home/Self Care Barriers to Discharge: Continued Medical Work up   Patient Goals and CMS Choice Patient states their goals for this hospitalization and ongoing recovery are:: To get better and return home CMS Medicare.gov Compare Post Acute Care list provided to:: Patient Choice offered to / list presented to : Patient      Expected Discharge Plan and Services   Discharge Planning Services: CM Consult   Living arrangements for the past 2 months: Single Family Home                 DME Arranged:  (DME at home includes electric scooter, RW, Rolator, ramp,)                    Prior Living Arrangements/Services Living arrangements for the past 2 months: Single Family Home Lives with:: Spouse Patient language and need for interpreter reviewed:: Yes Do you feel safe going back to the place where you live?: Yes      Need for Family Participation in Patient Care: Yes (Comment) Care giver support system in place?: Yes (comment)   Criminal Activity/Legal Involvement Pertinent to Current Situation/Hospitalization: No - Comment as needed  Activities of  Daily Living Home Assistive Devices/Equipment: Walker (specify type) ADL Screening (condition at time of admission) Patient's cognitive ability adequate to safely complete daily activities?: Yes Is the patient deaf or have difficulty hearing?: Yes (both ears with hearing aid - L one for now) Does the patient have difficulty seeing, even when wearing glasses/contacts?: Yes (with glasses) Does the patient have difficulty concentrating, remembering, or making decisions?: No Patient able to express need for assistance with ADLs?: Yes Does the patient have difficulty dressing or bathing?: No Independently performs ADLs?: Yes (appropriate for developmental age) Does the patient have difficulty walking or climbing stairs?: No Weakness of Legs: None Weakness of Arms/Hands: None  Permission Sought/Granted Permission sought to share information with : Case Manager, Family Supports Permission granted to share information with : Yes, Verbal Permission Granted        Permission granted to share info w Relationship: wife     Emotional Assessment Appearance:: Appears stated age Attitude/Demeanor/Rapport: Gracious Affect (typically observed): Accepting Orientation: : Oriented to Self, Oriented to Place, Oriented to  Time, Oriented to Situation Alcohol / Substance Use: Not Applicable    Admission diagnosis:  Cellulitis of left lower extremity [L03.116] Sepsis ([place]) [A41.9] Severe sepsis (HCC) [A41.9, R65.20] Cellulitis, unspecified cellulitis site [F74.94] Community acquired pneumonia, unspecified laterality [J18.9] Sepsis, due to unspecified organism, unspecified whether acute  organ dysfunction present [facility]) [A41.9] Patient Active Problem List   Diagnosis Date Noted   Severe sepsis ([place]) 03/16/2022   Right upper lobe pneumonia 03/16/2022   Cellulitis of left lower extremity 03/15/2022   HTN (hypertension) 10/20/2021   Chest pain of uncertain etiology    Frequent PVCs    Acute renal  failure superimposed on stage 3a chronic kidney disease ([place]) 10/19/2021   Lobar pneumonia ([place]) 10/07/2021   CAP (community acquired pneumonia) 10/01/2021   Back pain 09/23/2021   Herniated lumbar intervertebral disc 09/23/2021   Lumbar radiculopathy, acute 09/14/2021   Azotemia 09/14/2021   Intractable low back pain 09/13/2021   Acute encephalopathy 09/13/2021   Bandemia 09/13/2021   Acute respiratory failure with hypoxia ([place]) 09/13/2021   DOE (dyspnea on exertion) 12/25/2020   Gastric polyps    GI bleed 01/01/2020   Osteomyelitis ([place]) 01/01/2020   Charcot's joint, left ankle and foot 11/06/2019   Peripheral arterial disease ([place]) 07/12/2019   Pain in right elbow 10/30/2018   AKI (acute kidney injury) ([place]) 10/03/2018   Hypoalbuminemia 10/03/2018   Hypokalemia 10/03/2018   Hypothyroidism 10/03/2018   Stage 3a chronic kidney disease (CKD) ([place])    Other iron deficiency anemias    Normocytic anemia 06/29/2016   Chronic diastolic heart failure ([place]) 01/14/2014   Stable angina 01/14/2014   Obesity 01/14/2014   Pulmonary sarcoidosis ([place]) 01/14/2014   Uncontrolled type 2 diabetes mellitus with hyperglycemia, with long-term current use of insulin ([place]) 01/14/2014   Hyperlipidemia 01/14/2014   Coronary atherosclerosis of native coronary artery    Sinusitis, chronic 05/30/2011   OSA (obstructive sleep apnea) 10/09/2008   History of adrenal insufficiency 02/21/2008   Coronary artery disease with exertional angina ([place]) 11/20/2007   Sarcoidosis 02/20/2007   GERD 12/20/2006   PCP:  Shirline Frees, MD Pharmacy:   [id number] [place] [address] [id number]: [phone]: [phone] [place] - [address] [place]Phone: [phone]: [phone] [place]Phone: [phone]: [phone]    Social Determinants of Health (SDOH) Social History: SDOH Screenings   Food Insecurity: No Food Insecurity (10/21/2021)  Housing: Low Risk  (10/21/2021)  Transportation Needs: No Transportation Needs (10/21/2021)  Tobacco Use: Low Risk  (03/15/2022)   SDOH Interventions:     Readmission Risk Interventions    03/18/2022    9:07 AM  Readmission Risk Prevention Plan  Transportation Screening Complete  Medication Review ([place] Complete  PCP or Specialist appointment within 3-5 days of discharge Complete  [place]RI or [place]  SW Recovery Care/Counseling Consult Complete  Palliative Care Screening Complete  Skilled Nursing Facility Complete

## 2022-03-18 NOTE — Inpatient Diabetes Management (Signed)
Inpatient Diabetes Program Recommendations  AACE/ADA: New Consensus Statement on Inpatient Glycemic Control (2015)  Target Ranges:  Prepandial:   less than 140 mg/dL      Peak postprandial:   less than 180 mg/dL (1-2 hours)      Critically ill patients:  140 - 180 mg/dL   Lab Results  Component Value Date   GLUCAP 282 (H) 03/18/2022   HGBA1C 9.4 (H) 03/17/2022    Latest Reference Range & Units 03/17/22 12:20 03/17/22 16:29 03/17/22 19:32 03/18/22 08:20 03/18/22 11:53  Glucose-Capillary 70 - 99 mg/dL 297 (H) 302 (H) 295 (H) 210 (H) 282 (H)  (H): Data is abnormally high Review of Glycemic Control  Diabetes history: type 2 Outpatient Diabetes medications: 70/30 insulin 35 units BID Current orders for Inpatient glycemic control: Semglee 25 units BID, Novolog 0-20 units correction scale TID & 0-5 units HS scale, Novolog 6 units TID with meals  Inpatient Diabetes Program Recommendations:   Noted that blood sugars have been greater than 200 mg/dl.  Recommend increasing Novolog meal coverage to 10 units TID if blood sugars continue to be elevated and while on steroids. Started on Glucerna shake meal supplements which could increase blood sugars. May need to adjust Semglee dosage if blood sugars continue to >200 mg/dl.   Harvel Ricks RN BSN CDE Diabetes Coordinator Pager: [phone]  8am-5pm

## 2022-03-18 NOTE — Progress Notes (Signed)
PROGRESS NOTE    [name]  MQK:[id number] DOB: [birth year] DOA: 03/15/2022 PCP: Shirline Frees, MD   Brief Narrative:  77 year old M with PMH of CAD, diastolic CHF, OSA not on CPAP, adrenal insufficiency on prednisone, sarcoidosis, DM-2, CKD-3, PAD, HTN, hypothyroidism, anemia, obesity and GERD presenting with generalized weakness with associated nausea, vomiting, myalgia, fever, productive cough with blood-tinged phlegm and epistaxis for about a week and admitted for severe sepsis in the setting of cellulitis, possible pneumonia and rule out bacteremia. Recently treated with Z-Pak and steroid by PCP for possible pneumonia.  Upon admission there was concerns of right upper lobe pneumonia and left lower extremity cellulitis.  Venous Dopplers was negative for DVT.  Patient started on IV vancomycin and cefepime.  Cultures were drawn which remains negative, antibiotics were de-escalated to IV Ancef.  Vascular was consulted due to concerns of pain and PAD.  At this time recommended conservative management, aspirin and statin and outpatient follow-up.   Assessment & Plan:  Principal Problem:   Severe sepsis ([place]) Active Problems:   Sarcoidosis   GERD   OSA (obstructive sleep apnea)   History of adrenal insufficiency   Coronary atherosclerosis of native coronary artery   Chronic diastolic heart failure (HCC)   Obesity   Pulmonary sarcoidosis ([place])   Uncontrolled type 2 diabetes mellitus with hyperglycemia, with long-term current use of insulin (HCC)   Hyperlipidemia   Normocytic anemia   Hypothyroidism   Peripheral arterial disease (HCC)   Acute renal failure superimposed on stage 3a chronic kidney disease (HCC)   HTN (hypertension)   Cellulitis of left lower extremity   Right upper lobe pneumonia    Severe sepsis due to left lower extremity cellulitis and possible RUL pneumonia: POA.  Sepsis physiology is improving.  Antibiotics have been de-escalated from vancomycin and cefepime to IV  Ancef.  Slowly continues to improve. Solu-Medrol discontinued, on home daily prednisone.  Takes 10 mg orally daily at home.   AKI on CKD-3A: Likely prerenal from GI loss and concurrent use of diuretics and acute illness.  Baseline creatinine 1.1, today 1.58.  Gentle hydration.   Chronic diastolic CHF:  Appears euvolemic on exam except for LLE edema likely from cellulitis.  Limited TTE with LVEF of 50 to 55% (55-60 previously) and G1 DD.  BNP elevated to 1000 (higher than previous value).  He has been third spacing fluid.  Lasix is currently on hold.  Will be getting gentle hydration due to AKI.   Uncontrolled IDDM-2 with hyperglycemia and CKD-3A: A1c 10% in 09/2021.   On 70/30 at home. Currently on sliding scale, Accu-Chek, long-acting insulin.  Adjust as necessary   History of CAD/PAD/HLD: S/p 3 DES in November 2022.   No chest pain.  Preliminary on ABI nonconclusive on the right and unobtainable on the left.  Currently on aspirin, Plavix, statin, metoprolol.   Hypertension: Normotensive Continue metoprolol.  IV as needed.   Adrenal insufficiency:  On prednisone 10 mg daily at home. -Discontinue IV Solu-Cortef. -Resume home prednisone.   OSA not on CPAP-under evaluation for CPAP. -Trying nightly CPAP here.   Hypokalemia/hypomagnesemia Replete as needed   Generalized weakness/physical deconditioning/chronic back pain/radiculopathy -PT/OT eval   Epistaxis:  Very mild, subsided.  Hemoglobin stable.   GERD -Continue PPI   History of pulmonary sarcoid: Stable on CT.   Obesity Body mass index is 30.85 kg/m.   DVT prophylaxis: Lovenox   Code Status: Full code-confirmed with patient and wife. Family Communication: Spouse at bedside Level  of care: Med-Surg Status is: Inpatient Remains inpatient appropriate because: Severe sepsis in the setting of LLE cellulitis, ?pneumonia Continue IV antibiotics.  PT/OT.     Subjective: Seen and been at bedside, still having pain and  discomfort of his left lower extremity.   Examination:  General exam: Appears calm and comfortable  Respiratory system: Clear to auscultation. Respiratory effort normal. Cardiovascular system: S1 & S2 heard, RRR. No JVD, murmurs, rubs, gallops or clicks. No pedal edema. Gastrointestinal system: Abdomen is nondistended, soft and nontender. No organomegaly or masses felt. Normal bowel sounds heard. Central nervous system: Alert and oriented. No focal neurological deficits. Extremities: Symmetric 5 x 5 power. Skin: Left lower extremity swelling and erythema noted.  Dressing in place without any evidence of active bleeding. Psychiatry: Judgement and insight appear normal. Mood & affect appropriate.     Objective: Vitals:   03/17/22 1631 03/17/22 1931 03/18/22 0425 03/18/22 0822  BP: (!) 154/73 (!) 156/70 (!) 148/64 (!) 162/69  Pulse: 68 67 63 71  Resp: 16   16  Temp: 98.3 F (36.8 C) 99 F (37.2 C) 98.3 F (36.8 C) 98.7 F (37.1 C)  TempSrc: Oral Oral Oral   SpO2: 97% 98% 95% 93%  Weight:      Height:        Intake/Output Summary (Last 24 hours) at 03/18/2022 0840 Last data filed at 03/18/2022 0617 Gross per 24 hour  Intake 293.14 ml  Output 1200 ml  Net -906.86 ml   Filed Weights   03/15/22 1123  Weight: 97.5 kg     Data Reviewed:   CBC: Recent Labs  Lab 03/15/22 1225 03/16/22 0458 03/17/22 0317 03/18/22 0412  WBC 34.3* 32.2* 26.6* 14.9*  NEUTROABS 30.8*  --  24.7*  --   HGB 14.0 12.1* 10.9* 11.4*  HCT 45.8 38.7* 35.0* 37.5*  MCV 91.6 92.1 91.1 90.8  PLT 305 243 208 616   Basic Metabolic Panel: Recent Labs  Lab 03/15/22 1225 03/15/22 1700 03/15/22 1930 03/16/22 0458 03/17/22 0317 03/18/22 0412  NA 141  --   --  134* 133* 135  K 2.2*  --  3.7 4.0 4.1 3.2*  CL 98  --   --  97* 99 99  CO2 31  --   --  '25 24 25  '$ GLUCOSE 102*  --   --  313* 297* 198*  BUN 23  --   --  27* 37* 30*  CREATININE 1.51*  --   --  1.51* 1.70* 1.58*  CALCIUM 9.1  --   --   7.8* 8.1* 8.2*  MG  --  1.6*  --  2.0 2.2 2.1  PHOS  --   --   --   --  2.7 2.1*   GFR: Estimated Creatinine Clearance: 46.6 mL/min (A) (by C-G formula based on SCr of 1.58 mg/dL (H)). Liver Function Tests: Recent Labs  Lab 03/15/22 1225 03/16/22 0458 03/17/22 0317 03/18/22 0412  AST 48* 46* 32  --   ALT 38 38 31  --   ALKPHOS 60 54 63  --   BILITOT 1.0 0.7 0.7  --   PROT 6.7 5.5* 5.7*  --   ALBUMIN 3.1* 2.4* 2.2* 2.2*   No results for input(s): "LIPASE", "AMYLASE" in the last 168 hours. No results for input(s): "AMMONIA" in the last 168 hours. Coagulation Profile: Recent Labs  Lab 03/15/22 1225 03/16/22 0458  INR 1.1 1.3*   Cardiac Enzymes: Recent Labs  Lab 03/16/22 0458 03/17/22  0317  CKTOTAL 151 69   BNP (last 3 results) No results for input(s): "PROBNP" in the last 8760 hours. HbA1C: Recent Labs    03/17/22 0316  HGBA1C 9.4*   CBG: Recent Labs  Lab 03/17/22 0850 03/17/22 1220 03/17/22 1629 03/17/22 1932 03/18/22 0820  GLUCAP 281* 297* 302* 295* 210*   Lipid Profile: Recent Labs    03/18/22 0412  CHOL 87  HDL 20*  LDLCALC 33  TRIG 172*  CHOLHDL 4.4   Thyroid Function Tests: No results for input(s): "TSH", "T4TOTAL", "FREET4", "T3FREE", "THYROIDAB" in the last 72 hours. Anemia Panel: No results for input(s): "VITAMINB12", "FOLATE", "FERRITIN", "TIBC", "IRON", "RETICCTPCT" in the last 72 hours. Sepsis Labs: Recent Labs  Lab 03/15/22 1225 03/15/22 1333 03/16/22 0458 03/17/22 0317 03/18/22 0412  PROCALCITON  --   --  31.56 20.13 8.80  LATICACIDVEN 2.8* 1.7  --   --   --     Recent Results (from the past 240 hour(s))  Resp panel by RT-PCR (RSV, Flu A&B, Covid) Anterior Nasal Swab     Status: None   Collection Time: 03/15/22 11:33 AM   Specimen: Anterior Nasal Swab  Result Value Ref Range Status   SARS Coronavirus 2 by RT PCR NEGATIVE NEGATIVE Final    Comment: (NOTE) SARS-CoV-2 target nucleic acids are NOT DETECTED.  The  SARS-CoV-2 RNA is generally detectable in upper respiratory specimens during the acute phase of infection. The lowest concentration of SARS-CoV-2 viral copies this assay can detect is 138 copies/mL. A negative result does not preclude SARS-Cov-2 infection and should not be used as the sole basis for treatment or other patient management decisions. A negative result may occur with  improper specimen collection/handling, submission of specimen other than nasopharyngeal swab, presence of viral mutation(s) within the areas targeted by this assay, and inadequate number of viral copies(<138 copies/mL). A negative result must be combined with clinical observations, patient history, and epidemiological information. The expected result is Negative.  Fact Sheet for Patients:  EntrepreneurPulse.com.au  Fact Sheet for Healthcare Providers:  IncredibleEmployment.be  This test is no t yet approved or cleared by the [place] FDA and  has been authorized for detection and/or diagnosis of SARS-CoV-2 by FDA under an Emergency Use Authorization (EUA). This EUA will remain  in effect (meaning this test can be used) for the duration of the COVID-19 declaration under Section 564(b)(1) of the Act, 21 U.S.C.section 360bbb-3(b)(1), unless the authorization is terminated  or revoked sooner.       Influenza A by PCR NEGATIVE NEGATIVE Final   Influenza B by PCR NEGATIVE NEGATIVE Final    Comment: (NOTE) The Xpert Xpress SARS-CoV-2/FLU/RSV plus assay is intended as an aid in the diagnosis of influenza from Nasopharyngeal swab specimens and should not be used as a sole basis for treatment. Nasal washings and aspirates are unacceptable for Xpert Xpress SARS-CoV-2/FLU/RSV testing.  Fact Sheet for Patients: EntrepreneurPulse.com.au  Fact Sheet for Healthcare Providers: IncredibleEmployment.be  This test is not yet approved or  cleared by the [place] FDA and has been authorized for detection and/or diagnosis of SARS-CoV-2 by FDA under an Emergency Use Authorization (EUA). This EUA will remain in effect (meaning this test can be used) for the duration of the COVID-19 declaration under Section 564(b)(1) of the Act, 21 U.S.C. section 360bbb-3(b)(1), unless the authorization is terminated or revoked.     Resp Syncytial Virus by PCR NEGATIVE NEGATIVE Final    Comment: (NOTE) Fact Sheet for Patients: EntrepreneurPulse.com.au  Fact  Sheet for Healthcare Providers: IncredibleEmployment.be  This test is not yet approved or cleared by the [place] and has been authorized for detection and/or diagnosis of SARS-CoV-2 by FDA under an Emergency Use Authorization (EUA). This EUA will remain in effect (meaning this test can be used) for the duration of the COVID-19 declaration under Section 564(b)(1) of the Act, 21 U.S.C. section 360bbb-3(b)(1), unless the authorization is terminated or revoked.  Performed at [facility], [place] [address], [place], [place] [address]   Urine Culture     Status: Abnormal   Collection Time: 03/15/22 11:33 AM   Specimen: In/Out Cath Urine  Result Value Ref Range Status   Specimen Description IN/OUT CATH URINE  Final   Special Requests   Final    NONE Performed at [facility], [place] [address] [place], [place] [address]    Culture MULTIPLE SPECIES PRESENT, SUGGEST RECOLLECTION (A)  Final   Report Status 03/16/2022 FINAL  Final  Respiratory (~20 pathogens) panel by PCR     Status: None   Collection Time: 03/15/22 11:33 AM   Specimen: Nasopharyngeal Swab; Respiratory  Result Value Ref Range Status   Adenovirus NOT DETECTED NOT DETECTED Final   Coronavirus 229E NOT DETECTED NOT DETECTED Final    Comment: (NOTE) The Coronavirus on the Respiratory Panel, DOES NOT test for the novel  Coronavirus (2019 nCoV)    Coronavirus  HKU1 NOT DETECTED NOT DETECTED Final   Coronavirus NL63 NOT DETECTED NOT DETECTED Final   Coronavirus OC43 NOT DETECTED NOT DETECTED Final   Metapneumovirus NOT DETECTED NOT DETECTED Final   Rhinovirus / Enterovirus NOT DETECTED NOT DETECTED Final   Influenza A NOT DETECTED NOT DETECTED Final   Influenza B NOT DETECTED NOT DETECTED Final   Parainfluenza Virus 1 NOT DETECTED NOT DETECTED Final   Parainfluenza Virus 2 NOT DETECTED NOT DETECTED Final   Parainfluenza Virus 3 NOT DETECTED NOT DETECTED Final   Parainfluenza Virus 4 NOT DETECTED NOT DETECTED Final   Respiratory Syncytial Virus NOT DETECTED NOT DETECTED Final   Bordetella pertussis NOT DETECTED NOT DETECTED Final   Bordetella Parapertussis NOT DETECTED NOT DETECTED Final   Chlamydophila pneumoniae NOT DETECTED NOT DETECTED Final   Mycoplasma pneumoniae NOT DETECTED NOT DETECTED Final    Comment: Performed at [facility], [place] [address], [place], [place] [address]  Blood Culture (routine x 2)     Status: None (Preliminary result)   Collection Time: 03/15/22 12:25 PM   Specimen: BLOOD LEFT FOREARM  Result Value Ref Range Status   Specimen Description BLOOD LEFT FOREARM  Final   Special Requests   Final    BOTTLES DRAWN AEROBIC AND ANAEROBIC Blood Culture results may not be optimal due to an inadequate volume of blood received in culture bottles   Culture   Final    NO GROWTH 2 DAYS Performed at [facility], [place] [address] [place], [place] [address]    Report Status PENDING  Incomplete  Blood Culture (routine x 2)     Status: None (Preliminary result)   Collection Time: 03/15/22 12:25 PM   Specimen: BLOOD RIGHT FOREARM  Result Value Ref Range Status   Specimen Description BLOOD RIGHT FOREARM  Final   Special Requests   Final    BOTTLES DRAWN AEROBIC AND ANAEROBIC Blood Culture results may not be optimal due to an inadequate volume of blood received in culture bottles   Culture   Final  NO GROWTH 2  DAYS Performed at [facility], [place] [address] [place], [place] [address]    Report Status PENDING  Incomplete  MRSA Next Gen by PCR, Nasal     Status: None   Collection Time: 03/17/22  7:17 AM   Specimen: Nasal Mucosa; Nasal Swab  Result Value Ref Range Status   MRSA by PCR Next Gen NOT DETECTED NOT DETECTED Final    Comment: (NOTE) The GeneXpert MRSA Assay (FDA approved for NASAL specimens only), is one component of a comprehensive MRSA colonization surveillance program. It is not intended to diagnose MRSA infection nor to guide or monitor treatment for MRSA infections. Test performance is not FDA approved in patients less than 39 years old. Performed at [facility], [place] [address], [place], [place] [address]          Radiology Studies: VAS [place] ABI WITH/WO TBI  Result Date: 03/17/2022  LOWER EXTREMITY DOPPLER STUDY Patient Name:  [name]  Date of Exam:   03/17/2022 Medical Rec #: [id number]   Accession #:    [id number] Date of Birth: 01-May-1945   Patient Gender: M Patient Age:   48 years Exam Location:  [facility] Procedure:      VAS [place] ABI WITH/WO TBI Referring Phys: Bretta Bang GONFA --------------------------------------------------------------------------------  Indications: Decreased pulses on clinical exam, history of PAD with              non-compressible/abnormal ABIs High Risk Factors: Hypertension, Diabetes, coronary artery disease.  Limitations: Today's exam was limited due to left leg pain in the setting of              cellulitis, bandaging to right upper arm. Comparison Study: 07-04-2019 Prior ABI w/ TBI was non-compressible. Right TBI                   0.76 WNL, Left TBI 0.68 Abnormal. Performing Technologist: Darlin Coco RDMS RVT  Examination Guidelines: A complete evaluation includes at minimum, Doppler waveform signals and systolic blood pressure reading at the level of bilateral brachial, anterior tibial, and posterior tibial arteries, when vessel  segments are accessible. Bilateral testing is considered an integral part of a complete examination. Photoelectric Plethysmograph (PPG) waveforms and toe systolic pressure readings are included as required and additional duplex testing as needed. Limited examinations for reoccurring indications may be performed as noted.  ABI Findings: +---------+------------------+-----+-----------+--------+ Right    Rt Pressure (mmHg)IndexWaveform   Comment  +---------+------------------+-----+-----------+--------+ Brachial 140                    triphasic           +---------+------------------+-----+-----------+--------+ PTA      255               1.82 multiphasic         +---------+------------------+-----+-----------+--------+ DP       255               1.82 multiphasic         +---------+------------------+-----+-----------+--------+ Great Toe132               0.94 Normal              +---------+------------------+-----+-----------+--------+ +---------+------------------+-----+----------+--------------------------------+ Left     Lt Pressure (mmHg)IndexWaveform  Comment                          +---------+------------------+-----+----------+--------------------------------+ Brachial  triphasic Bandaging- unable to obtain                                                pressure                         +---------+------------------+-----+----------+--------------------------------+ PTA                             monophasicUnable to obtain pressure                                                  secondary to pain in the setting                                           of cellulitis                    +---------+------------------+-----+----------+--------------------------------+ DP                              biphasic  Unable to obtain pressure                                                  secondary to pain in the setting                                            of cellulitis                    +---------+------------------+-----+----------+--------------------------------+ Great Toe55                0.39 Abnormal                                   +---------+------------------+-----+----------+--------------------------------+ +-------+------------+-----------+----------------+------------+ ABI/TBIToday's ABI Today's TBIPrevious ABI    Previous TBI +-------+------------+-----------+----------------+------------+ Right  [place]          0.94       [place]              0.76         +-------+------------+-----------+----------------+------------+ Left   Unobtainable0.39       1.12 (Calcified)0.68         +-------+------------+-----------+----------------+------------+ Arterial wall calcification precludes accurate ankle pressures and ABIs.  Summary: Right: Resting right ankle-brachial index indicates noncompressible right lower extremity arteries. The right toe-brachial index is normal. Left: The left toe-brachial index is abnormal. Although pressures were not obtained secondary to patient pain in the setting of cellulitis, arterial doppler waveforms at the ankle suggest some component of arterial occlusive disease. Previous examinations appeared calcified/unreliable. *See table(s) above for measurements and observations.  Electronically signed by Jamelle Haring on 03/17/2022 at 4:48:09 PM.  Final    VAS [place] LOWER EXTREMITY VENOUS (DVT)  Result Date: 03/17/2022  Lower Venous DVT Study Patient Name:  [name]  Date of Exam:   03/17/2022 Medical Rec #: [id number]   Accession #:    [id number] Date of Birth: 28-Aug-1945   Patient Gender: M Patient Age:   59 years Exam Location:  [facility] Procedure:      VAS [place] LOWER EXTREMITY VENOUS (DVT) Referring Phys: Bretta Bang GONFA --------------------------------------------------------------------------------  Indications: Pain, swelling, and erythema LT>RT, left leg  cellulitis.  Comparison Study: 10-01-2021 Prior left lower extremity venous study was                   negative for DVT. Performing Technologist: Darlin Coco RDMS, RVT  Examination Guidelines: A complete evaluation includes B-mode imaging, spectral Doppler, color Doppler, and power Doppler as needed of all accessible portions of each vessel. Bilateral testing is considered an integral part of a complete examination. Limited examinations for reoccurring indications may be performed as noted. The reflux portion of the exam is performed with the patient in reverse Trendelenburg.  +---------+---------------+---------+-----------+----------+--------------+ RIGHT    CompressibilityPhasicitySpontaneityPropertiesThrombus Aging +---------+---------------+---------+-----------+----------+--------------+ CFV      Full           Yes      Yes                                 +---------+---------------+---------+-----------+----------+--------------+ SFJ      Full                                                        +---------+---------------+---------+-----------+----------+--------------+ FV Prox  Full                                                        +---------+---------------+---------+-----------+----------+--------------+ FV Mid   Full                                                        +---------+---------------+---------+-----------+----------+--------------+ FV DistalFull                                                        +---------+---------------+---------+-----------+----------+--------------+ PFV      Full                                                        +---------+---------------+---------+-----------+----------+--------------+ POP      Full           Yes      Yes                                 +---------+---------------+---------+-----------+----------+--------------+  PTV      Full                                                         +---------+---------------+---------+-----------+----------+--------------+ PERO     Full                                                        +---------+---------------+---------+-----------+----------+--------------+ Gastroc  Full                                                        +---------+---------------+---------+-----------+----------+--------------+   +---------+---------------+---------+-----------+----------+-------------------+ LEFT     CompressibilityPhasicitySpontaneityPropertiesThrombus Aging      +---------+---------------+---------+-----------+----------+-------------------+ CFV      Full           Yes      Yes                                      +---------+---------------+---------+-----------+----------+-------------------+ SFJ      Full                                                             +---------+---------------+---------+-----------+----------+-------------------+ FV Prox  Full                                                             +---------+---------------+---------+-----------+----------+-------------------+ FV Mid   Full                                                             +---------+---------------+---------+-----------+----------+-------------------+ FV DistalFull                                                             +---------+---------------+---------+-----------+----------+-------------------+ PFV      Full                                                             +---------+---------------+---------+-----------+----------+-------------------+ POP      Full  Yes      Yes                                      +---------+---------------+---------+-----------+----------+-------------------+ PTV      Full                                                             +---------+---------------+---------+-----------+----------+-------------------+ PERO     Full                                          Not well visualized                                                       secondary to                                                              overlying                                                                 edema--patent by                                                          color               +---------+---------------+---------+-----------+----------+-------------------+ Gastroc  Full                                                             +---------+---------------+---------+-----------+----------+-------------------+     Summary: RIGHT: - There is no evidence of deep vein thrombosis in the lower extremity.  - No cystic structure found in the popliteal fossa.  LEFT: - There is no evidence of deep vein thrombosis in the lower extremity.  - No cystic structure found in the popliteal fossa.  *See table(s) above for measurements and observations. Electronically signed by Jamelle Haring on 03/17/2022 at 4:47:07 PM.    Final    ECHOCARDIOGRAM LIMITED  Result Date: 03/17/2022    ECHOCARDIOGRAM LIMITED REPORT   Patient Name:   [name] Date of Exam: 03/17/2022 Medical Rec #:  [id number]  Height:  70.0 in Accession #:    [id number] Weight:       215.0 lb Date of Birth:  1945-07-27  BSA:          2.152 m Patient Age:    51 years   BP:           133/68 mmHg Patient Gender: M          HR:           65 bpm. Exam Location:  Inpatient Procedure: Limited Echo, Limited Color Doppler and Color Doppler Indications:    other abnormalities of the heart  History:        Patient has prior history of Echocardiogram examinations, most                 recent 10/21/2021. CAD, chronic kidney disease. Sarcoidosis; Risk                 Factors:Sleep Apnea, Diabetes, Dyslipidemia and Hypertension.  Sonographer:    [facility] Referring Phys: [id number] TAYE T GONFA IMPRESSIONS  1. Diffiicult acoustic windows, endocardium is difficult  to see Lateral wall appears mildly hypokinetic. Would consider limited echo with Definity to help delineate. . Left ventricular ejection fraction, by estimation, is 50 to 55%. The left ventricle has low normal function. Left ventricular diastolic parameters are consistent with Grade I diastolic dysfunction (impaired relaxation).  2. Right ventricular systolic function is low normal. The right ventricular size is normal.  3. Trivial mitral valve regurgitation.  4. The inferior vena cava is dilated in size with <50% respiratory variability, suggesting right atrial pressure of 15 mmHg. FINDINGS  Left Ventricle: Diffiicult acoustic windows, endocardium is difficult to see Lateral wall appears mildly hypokinetic. Would consider limited echo with Definity to help delineate. Left ventricular ejection fraction, by estimation, is 50 to 55%. The left ventricle has low normal function. The left ventricular internal cavity size was normal in size. There is no left ventricular hypertrophy. Left ventricular diastolic parameters are consistent with Grade I diastolic dysfunction (impaired relaxation). Right Ventricle: The right ventricular size is normal. Right ventricular systolic function is low normal. Pericardium: There is no evidence of pericardial effusion. Mitral Valve: There is mild thickening of the mitral valve leaflet(s). Mild mitral annular calcification. Trivial mitral valve regurgitation. Tricuspid Valve: The tricuspid valve is normal in structure. Tricuspid valve regurgitation is trivial. Pulmonic Valve: The pulmonic valve was normal in structure. Pulmonic valve regurgitation is mild. Venous: The inferior vena cava is dilated in size with less than 50% respiratory variability, suggesting right atrial pressure of 15 mmHg. Additional Comments: Spectral Doppler performed. Color Doppler performed.  LEFT VENTRICLE PLAX 2D LVIDd:         5.30 cm      Diastology LVIDs:         3.90 cm      LV e' medial:    4.57 cm/s LV PW:          1.00 cm      LV E/e' medial:  20.7 LV IVS:        0.90 cm      LV e' lateral:   5.77 cm/s LVOT diam:     2.00 cm      LV E/e' lateral: 16.4 LV SV:         58 LV SV Index:   27 LVOT Area:     3.14 cm  LV Volumes (MOD) LV vol d, MOD A4C: 109.0 ml LV vol s,  MOD A4C: 60.4 ml LV SV MOD A4C:     109.0 ml IVC IVC diam: 2.00 cm LEFT ATRIUM         Index LA diam:    3.80 cm 1.77 cm/m  AORTIC VALVE LVOT Vmax:   86.20 cm/s LVOT Vmean:  57.300 cm/s LVOT VTI:    0.186 m  AORTA Ao Asc diam: 3.00 cm MITRAL VALVE MV Area (PHT): 4.24 cm    SHUNTS MV Decel Time: 179 msec    Systemic VTI:  0.19 m MV E velocity: 94.50 cm/s  Systemic Diam: 2.00 cm MV A velocity: 91.90 cm/s MV E/A ratio:  1.03 Dorris Carnes MD Electronically signed by Dorris Carnes MD Signature Date/Time: 03/17/2022/9:09:39 AM    Final    DG Foot 2 Views Left  Result Date: 03/16/2022 CLINICAL DATA:  Swelling EXAM: LEFT FOOT - 2 VIEW COMPARISON:  12/24/2019 FINDINGS: Chronic changes of Charcot arthropathy of the midfoot with midfoot collapse. Progressive fusion of the cuboid and cuneiforms. Prior first MTP joint arthrodesis with intact hardware. Similar subluxation of the second toe DIP joint. No evidence of an acute fracture or traumatic dislocation. No definite sites of erosion or aggressive periosteal elevation. Diffuse osseous demineralization. Extensive soft tissue swelling of the lower leg and forefoot. Prominent atherosclerotic vascular calcifications. No soft tissue gas. IMPRESSION: 1. Chronic changes of Charcot arthropathy of the midfoot with midfoot collapse. 2. Extensive soft tissue swelling of the lower leg and forefoot. No soft tissue gas. 3. No definite radiographic evidence of osteomyelitis. Electronically Signed   By: Davina Poke D.O.   On: 03/16/2022 13:45   DG Tibia/Fibula Left  Result Date: 03/16/2022 CLINICAL DATA:  Pain and swelling EXAM: LEFT TIBIA AND FIBULA - 2 VIEW COMPARISON:  12/24/2019 FINDINGS: There is no evidence of fracture  or other focal bone lesions. No subcutaneous gas. Extensive arterial calcifications as before. IMPRESSION: No acute findings. Extensive arterial calcifications. Electronically Signed   By: Lucrezia Europe M.D.   On: 03/16/2022 13:36        Scheduled Meds:  aspirin EC  81 mg Oral QPM   enoxaparin (LOVENOX) injection  40 mg Subcutaneous Q24H   feeding supplement (GLUCERNA SHAKE)  237 mL Oral TID BM   insulin aspart  0-20 Units Subcutaneous TID WC   insulin aspart  0-5 Units Subcutaneous QHS   insulin aspart  6 Units Subcutaneous TID WC   insulin glargine-yfgn  25 Units Subcutaneous BID   levothyroxine  75 mcg Oral Q0600   metoprolol tartrate  12.5 mg Oral BID   multivitamin with minerals  1 tablet Oral Daily   pantoprazole  40 mg Oral Daily   predniSONE  10 mg Oral Q breakfast   rosuvastatin  20 mg Oral QHS   sodium chloride flush  3 mL Intravenous Q12H   Continuous Infusions:   ceFAZolin (ANCEF) IV 2 g (03/17/22 2056)     LOS: 3 days   Time spent= 35 mins    Demarie Hyneman Arsenio Loader, MD [facility]  If 7PM-7AM, please contact night-coverage  03/18/2022, 8:40 AM

## 2022-03-19 DIAGNOSIS — A419 Sepsis, unspecified organism: Secondary | ICD-10-CM | POA: Diagnosis not present

## 2022-03-19 DIAGNOSIS — R6520 Severe sepsis without septic shock: Secondary | ICD-10-CM | POA: Diagnosis not present

## 2022-03-19 LAB — CBC
HCT: 35 % — ABNORMAL LOW (ref 39.0–52.0)
Hemoglobin: 11 g/dL — ABNORMAL LOW (ref 13.0–17.0)
MCH: 28.1 pg (ref 26.0–34.0)
MCHC: 31.4 g/dL (ref 30.0–36.0)
MCV: 89.5 fL (ref 80.0–100.0)
Platelets: 226 10*3/uL (ref 150–400)
RBC: 3.91 MIL/uL — ABNORMAL LOW (ref 4.22–5.81)
RDW: 18.9 % — ABNORMAL HIGH (ref 11.5–15.5)
WBC: 13.1 10*3/uL — ABNORMAL HIGH (ref 4.0–10.5)
nRBC: 0 % (ref 0.0–0.2)

## 2022-03-19 LAB — BASIC METABOLIC PANEL
Anion gap: 10 (ref 5–15)
BUN: 22 mg/dL (ref 8–23)
CO2: 26 mmol/L (ref 22–32)
Calcium: 7.8 mg/dL — ABNORMAL LOW (ref 8.9–10.3)
Chloride: 100 mmol/L (ref 98–111)
Creatinine, Ser: 1.79 mg/dL — ABNORMAL HIGH (ref 0.61–1.24)
GFR, Estimated: 39 mL/min — ABNORMAL LOW (ref >60)
Glucose, Bld: 221 mg/dL — ABNORMAL HIGH (ref 70–99)
Potassium: 3.4 mmol/L — ABNORMAL LOW (ref 3.5–5.1)
Sodium: 136 mmol/L (ref 135–145)

## 2022-03-19 LAB — GLUCOSE, CAPILLARY
Glucose-Capillary: 226 mg/dL — ABNORMAL HIGH (ref 70–99)
Glucose-Capillary: 327 mg/dL — ABNORMAL HIGH (ref 70–99)
Glucose-Capillary: 256 mg/dL — ABNORMAL HIGH (ref 70–99)
Glucose-Capillary: 211 mg/dL — ABNORMAL HIGH (ref 70–99)

## 2022-03-19 LAB — MAGNESIUM: Magnesium: 1.9 mg/dL (ref 1.7–2.4)

## 2022-03-19 MED ORDER — INSULIN GLARGINE-YFGN 100 UNIT/ML ~~LOC~~ SOLN
28.0000 [IU] | Freq: Two times a day (BID) | SUBCUTANEOUS | Status: DC
  Administered 2022-03-19 – 2022-03-21 (×5): 28 [IU] via SUBCUTANEOUS
  Filled 2022-03-19 (×6): qty 0.28

## 2022-03-19 MED ORDER — INSULIN ASPART 100 UNIT/ML IJ SOLN
10.0000 [IU] | Freq: Three times a day (TID) | INTRAMUSCULAR | Status: DC
  Administered 2022-03-19 – 2022-03-21 (×7): 10 [IU] via SUBCUTANEOUS

## 2022-03-19 MED ORDER — POTASSIUM CHLORIDE CRYS ER 20 MEQ PO TBCR
40.0000 meq | EXTENDED_RELEASE_TABLET | Freq: Once | ORAL | Status: AC
  Administered 2022-03-19: 40 meq via ORAL
  Filled 2022-03-19: qty 2

## 2022-03-19 MED ORDER — SODIUM CHLORIDE 0.9 % IV SOLN: INTRAVENOUS | Status: DC

## 2022-03-19 NOTE — Progress Notes (Signed)
Mobility Specialist Progress Note    03/19/22 1652  Mobility  Activity Ambulated with assistance in hallway  Level of Assistance Standby assist, set-up cues, supervision of patient - no hands on  Assistive Device Front wheel walker  Distance Ambulated (ft) 500 ft  Activity Response Tolerated well  $Mobility charge 1 Mobility   Pt received in bed and agreeable. No complaints on walk. Returned to sitting EOB with call bell in reach.    Hildred Alamin Mobility Specialist  Please Psychologist, sport and exercise or Rehab Office at [phone]

## 2022-03-19 NOTE — Progress Notes (Addendum)
PROGRESS NOTE    [name]  GPQ:[id number] DOB: 03-08-1945 DOA: 03/15/2022 PCP: Shirline Frees, MD   Brief Narrative:  77 year old M with PMH of CAD, diastolic CHF, OSA not on CPAP, adrenal insufficiency on prednisone, sarcoidosis, DM-2, CKD-3, PAD, HTN, hypothyroidism, anemia, obesity and GERD presenting with generalized weakness with associated nausea, vomiting, myalgia, fever, productive cough with blood-tinged phlegm and epistaxis for about a week and admitted for severe sepsis in the setting of cellulitis, possible pneumonia and rule out bacteremia. Recently treated with Z-Pak and steroid by PCP for possible pneumonia.  Upon admission there was concerns of right upper lobe pneumonia and left lower extremity cellulitis.  Venous Dopplers was negative for DVT.  Patient started on IV vancomycin and cefepime.  Cultures were drawn which remains negative, antibiotics were de-escalated to IV Ancef.  Vascular was consulted due to concerns of pain and PAD.  At this time recommended conservative management, aspirin and statin and outpatient follow-up.   Assessment & Plan:  Principal Problem:   Severe sepsis ([place]) Active Problems:   Sarcoidosis   GERD   OSA (obstructive sleep apnea)   History of adrenal insufficiency   Coronary atherosclerosis of native coronary artery   Chronic diastolic heart failure (HCC)   Obesity   Pulmonary sarcoidosis ([place])   Uncontrolled type 2 diabetes mellitus with hyperglycemia, with long-term current use of insulin (HCC)   Hyperlipidemia   Normocytic anemia   Hypothyroidism   Peripheral arterial disease (HCC)   Acute renal failure superimposed on stage 3a chronic kidney disease (HCC)   HTN (hypertension)   Cellulitis of left lower extremity   Right upper lobe pneumonia    Severe sepsis due to left lower extremity cellulitis and possible RUL pneumonia: POA.  Sepsis physiology improving, now on IV Ancef.  Swelling is slow to improve. Solu-Medrol discontinued,  on home daily prednisone.  Takes 10 mg orally daily at home.   AKI on CKD-3A:  Likely prerenal from GI loss and concurrent use of diuretics and acute illness.  Baseline creatinine 1.1, today 1.79.  Continue gentle hydration   Chronic diastolic CHF:  Appears euvolemic on exam except for LLE edema likely from cellulitis.  Limited TTE with LVEF of 50 to 55% (55-60 previously) and G1 DD.  BNP elevated to 1000 (higher than previous value).  He has been third spacing fluid.  Lasix is currently on hold.  Will be getting gentle hydration due to AKI.   Uncontrolled IDDM-2 with hyperglycemia and CKD-3A: A1c 10% in 09/2021.   On 70/30 at home. Currently on sliding scale, Accu-Chek, long-acting insulin.  Adjusting Semglee and Premeal insulin   History of CAD/PAD/HLD: S/p 3 DES in November 2022.   No chest pain.  Preliminary on ABI nonconclusive on the right and unobtainable on the left.  Currently on aspirin, Plavix, statin, metoprolol.   Hypertension: Normotensive Continue metoprolol.  IV as needed.   Adrenal insufficiency:  On prednisone 10 mg daily at home.   OSA not on CPAP-under evaluation for CPAP. -Trying nightly CPAP here.   Hypokalemia/hypomagnesemia Replete as needed   Generalized weakness/physical deconditioning/chronic back pain/radiculopathy -PT/OT eval   Epistaxis:  Subsided, hemoglobin stable   GERD -Continue PPI   History of pulmonary sarcoid: Stable on CT.   Obesity Body mass index is 30.85 kg/m.  Hypokalemia - Repletion as needed  PT/OT-home health   DVT prophylaxis: Lovenox  Code Status: Full code-confirmed with patient and wife. Family Communication: Spouse at bedside Level of care: Med-Surg Status is:  Inpatient Remains inpatient appropriate because: Continue IV antibiotics today.   Subjective: Still has left lower extremity swelling and erythema.  Slight improvement compared to yesterday. Examination: Constitutional: Not in acute  distress Respiratory: Clear to auscultation bilaterally Cardiovascular: Normal sinus rhythm, no rubs Abdomen: Nontender nondistended good bowel sounds Musculoskeletal: No edema noted Skin: Left extremity swelling and erythema Neurologic: CN 2-12 grossly intact.  And nonfocal Psychiatric: Normal judgment and insight. Alert and oriented x 3. Normal mood.   Objective: Vitals:   03/18/22 2047 03/18/22 2159 03/19/22 0427 03/19/22 0800  BP: (!) 154/61 (!) 153/71 (!) 154/68 (!) 178/72  Pulse: (!) 59 (!) 58 64 64  Resp: '18 18 18 20  '$ Temp: 98.2 F (36.8 C) 98.2 F (36.8 C) 98.4 F (36.9 C) 98.2 F (36.8 C)  TempSrc: Oral Oral Oral Oral  SpO2:  98% 93% 93%  Weight:      Height:        Intake/Output Summary (Last 24 hours) at 03/19/2022 0802 Last data filed at 03/19/2022 0738 Gross per 24 hour  Intake --  Output 1600 ml  Net -1600 ml   Filed Weights   03/15/22 1123  Weight: 97.5 kg     Data Reviewed:   CBC: Recent Labs  Lab 03/15/22 1225 03/16/22 0458 03/17/22 0317 03/18/22 0412 03/19/22 0232  WBC 34.3* 32.2* 26.6* 14.9* 13.1*  NEUTROABS 30.8*  --  24.7*  --   --   HGB 14.0 12.1* 10.9* 11.4* 11.0*  HCT 45.8 38.7* 35.0* 37.5* 35.0*  MCV 91.6 92.1 91.1 90.8 89.5  PLT 305 243 208 220 341   Basic Metabolic Panel: Recent Labs  Lab 03/15/22 1225 03/15/22 1700 03/15/22 1930 03/16/22 0458 03/17/22 0317 03/18/22 0412 03/19/22 0232  NA 141  --   --  134* 133* 135 136  K 2.2*  --  3.7 4.0 4.1 3.2* 3.4*  CL 98  --   --  97* 99 99 100  CO2 31  --   --  '25 24 25 26  '$ GLUCOSE 102*  --   --  313* 297* 198* 221*  BUN 23  --   --  27* 37* 30* 22  CREATININE 1.51*  --   --  1.51* 1.70* 1.58* 1.79*  CALCIUM 9.1  --   --  7.8* 8.1* 8.2* 7.8*  MG  --  1.6*  --  2.0 2.2 2.1 1.9  PHOS  --   --   --   --  2.7 2.1*  --    GFR: Estimated Creatinine Clearance: 41.1 mL/min (A) (by C-G formula based on SCr of 1.79 mg/dL (H)). Liver Function Tests: Recent Labs  Lab 03/15/22 1225  03/16/22 0458 03/17/22 0317 03/18/22 0412  AST 48* 46* 32  --   ALT 38 38 31  --   ALKPHOS 60 54 63  --   BILITOT 1.0 0.7 0.7  --   PROT 6.7 5.5* 5.7*  --   ALBUMIN 3.1* 2.4* 2.2* 2.2*   No results for input(s): "LIPASE", "AMYLASE" in the last 168 hours. No results for input(s): "AMMONIA" in the last 168 hours. Coagulation Profile: Recent Labs  Lab 03/15/22 1225 03/16/22 0458  INR 1.1 1.3*   Cardiac Enzymes: Recent Labs  Lab 03/16/22 0458 03/17/22 0317  CKTOTAL 151 69   BNP (last 3 results) No results for input(s): "PROBNP" in the last 8760 hours. HbA1C: Recent Labs    03/17/22 0316  HGBA1C 9.4*   CBG: Recent Labs  Lab  03/17/22 1932 03/18/22 0820 03/18/22 1153 03/18/22 1626 03/18/22 2041  GLUCAP 295* 210* 282* 310* 273*   Lipid Profile: Recent Labs    03/18/22 0412  CHOL 87  HDL 20*  LDLCALC 33  TRIG 172*  CHOLHDL 4.4   Thyroid Function Tests: No results for input(s): "TSH", "T4TOTAL", "FREET4", "T3FREE", "THYROIDAB" in the last 72 hours. Anemia Panel: No results for input(s): "VITAMINB12", "FOLATE", "FERRITIN", "TIBC", "IRON", "RETICCTPCT" in the last 72 hours. Sepsis Labs: Recent Labs  Lab 03/15/22 1225 03/15/22 1333 03/16/22 0458 03/17/22 0317 03/18/22 0412  PROCALCITON  --   --  31.56 20.13 8.80  LATICACIDVEN 2.8* 1.7  --   --   --     Recent Results (from the past 240 hour(s))  Resp panel by RT-PCR (RSV, Flu A&B, Covid) Anterior Nasal Swab     Status: None   Collection Time: 03/15/22 11:33 AM   Specimen: Anterior Nasal Swab  Result Value Ref Range Status   SARS Coronavirus 2 by RT PCR NEGATIVE NEGATIVE Final    Comment: (NOTE) SARS-CoV-2 target nucleic acids are NOT DETECTED.  The SARS-CoV-2 RNA is generally detectable in upper respiratory specimens during the acute phase of infection. The lowest concentration of SARS-CoV-2 viral copies this assay can detect is 138 copies/mL. A negative result does not preclude  SARS-Cov-2 infection and should not be used as the sole basis for treatment or other patient management decisions. A negative result may occur with  improper specimen collection/handling, submission of specimen other than nasopharyngeal swab, presence of viral mutation(s) within the areas targeted by this assay, and inadequate number of viral copies(<138 copies/mL). A negative result must be combined with clinical observations, patient history, and epidemiological information. The expected result is Negative.  Fact Sheet for Patients:  EntrepreneurPulse.com.au  Fact Sheet for Healthcare Providers:  IncredibleEmployment.be  This test is no t yet approved or cleared by the [place] FDA and  has been authorized for detection and/or diagnosis of SARS-CoV-2 by FDA under an Emergency Use Authorization (EUA). This EUA will remain  in effect (meaning this test can be used) for the duration of the COVID-19 declaration under Section 564(b)(1) of the Act, 21 U.S.C.section 360bbb-3(b)(1), unless the authorization is terminated  or revoked sooner.       Influenza A by PCR NEGATIVE NEGATIVE Final   Influenza B by PCR NEGATIVE NEGATIVE Final    Comment: (NOTE) The Xpert Xpress SARS-CoV-2/FLU/RSV plus assay is intended as an aid in the diagnosis of influenza from Nasopharyngeal swab specimens and should not be used as a sole basis for treatment. Nasal washings and aspirates are unacceptable for Xpert Xpress SARS-CoV-2/FLU/RSV testing.  Fact Sheet for Patients: EntrepreneurPulse.com.au  Fact Sheet for Healthcare Providers: IncredibleEmployment.be  This test is not yet approved or cleared by the [place] FDA and has been authorized for detection and/or diagnosis of SARS-CoV-2 by FDA under an Emergency Use Authorization (EUA). This EUA will remain in effect (meaning this test can be used) for the duration of  the COVID-19 declaration under Section 564(b)(1) of the Act, 21 U.S.C. section 360bbb-3(b)(1), unless the authorization is terminated or revoked.     Resp Syncytial Virus by PCR NEGATIVE NEGATIVE Final    Comment: (NOTE) Fact Sheet for Patients: EntrepreneurPulse.com.au  Fact Sheet for Healthcare Providers: IncredibleEmployment.be  This test is not yet approved or cleared by the [place] FDA and has been authorized for detection and/or diagnosis of SARS-CoV-2 by FDA under an Emergency Use Authorization (EUA). This EUA  will remain in effect (meaning this test can be used) for the duration of the COVID-19 declaration under Section 564(b)(1) of the Act, 21 U.S.C. section 360bbb-3(b)(1), unless the authorization is terminated or revoked.  Performed at [facility], [place] [address] [place], [place] [address]   Urine Culture     Status: Abnormal   Collection Time: 03/15/22 11:33 AM   Specimen: In/Out Cath Urine  Result Value Ref Range Status   Specimen Description IN/OUT CATH URINE  Final   Special Requests   Final    NONE Performed at [facility], [place] [address] [place], [place] [address]    Culture MULTIPLE SPECIES PRESENT, SUGGEST RECOLLECTION (A)  Final   Report Status 03/16/2022 FINAL  Final  Respiratory (~20 pathogens) panel by PCR     Status: None   Collection Time: 03/15/22 11:33 AM   Specimen: Nasopharyngeal Swab; Respiratory  Result Value Ref Range Status   Adenovirus NOT DETECTED NOT DETECTED Final   Coronavirus 229E NOT DETECTED NOT DETECTED Final    Comment: (NOTE) The Coronavirus on the Respiratory Panel, DOES NOT test for the novel  Coronavirus (2019 nCoV)    Coronavirus HKU1 NOT DETECTED NOT DETECTED Final   Coronavirus NL63 NOT DETECTED NOT DETECTED Final   Coronavirus OC43 NOT DETECTED NOT DETECTED Final   Metapneumovirus NOT DETECTED NOT DETECTED Final   Rhinovirus / Enterovirus NOT DETECTED NOT  DETECTED Final   Influenza A NOT DETECTED NOT DETECTED Final   Influenza B NOT DETECTED NOT DETECTED Final   Parainfluenza Virus 1 NOT DETECTED NOT DETECTED Final   Parainfluenza Virus 2 NOT DETECTED NOT DETECTED Final   Parainfluenza Virus 3 NOT DETECTED NOT DETECTED Final   Parainfluenza Virus 4 NOT DETECTED NOT DETECTED Final   Respiratory Syncytial Virus NOT DETECTED NOT DETECTED Final   Bordetella pertussis NOT DETECTED NOT DETECTED Final   Bordetella Parapertussis NOT DETECTED NOT DETECTED Final   Chlamydophila pneumoniae NOT DETECTED NOT DETECTED Final   Mycoplasma pneumoniae NOT DETECTED NOT DETECTED Final    Comment: Performed at [facility], [place]. [address], [place], [place] [address]  Blood Culture (routine x 2)     Status: None (Preliminary result)   Collection Time: 03/15/22 12:25 PM   Specimen: BLOOD LEFT FOREARM  Result Value Ref Range Status   Specimen Description BLOOD LEFT FOREARM  Final   Special Requests   Final    BOTTLES DRAWN AEROBIC AND ANAEROBIC Blood Culture results may not be optimal due to an inadequate volume of blood received in culture bottles   Culture   Final    NO GROWTH 4 DAYS Performed at [facility], [place] [address] [place], [place] [address]    Report Status PENDING  Incomplete  Blood Culture (routine x 2)     Status: None (Preliminary result)   Collection Time: 03/15/22 12:25 PM   Specimen: BLOOD RIGHT FOREARM  Result Value Ref Range Status   Specimen Description BLOOD RIGHT FOREARM  Final   Special Requests   Final    BOTTLES DRAWN AEROBIC AND ANAEROBIC Blood Culture results may not be optimal due to an inadequate volume of blood received in culture bottles   Culture   Final    NO GROWTH 4 DAYS Performed at [facility], [place] [address], [place], [place] [address]    Report Status PENDING  Incomplete  MRSA Next Gen by PCR, Nasal     Status: None  Collection Time: 03/17/22  7:17 AM   Specimen: Nasal Mucosa; Nasal Swab   Result Value Ref Range Status   MRSA by PCR Next Gen NOT DETECTED NOT DETECTED Final    Comment: (NOTE) The GeneXpert MRSA Assay (FDA approved for NASAL specimens only), is one component of a comprehensive MRSA colonization surveillance program. It is not intended to diagnose MRSA infection nor to guide or monitor treatment for MRSA infections. Test performance is not FDA approved in patients less than 45 years old. Performed at [facility], [place] [address] [place], [place] [address]          Radiology Studies: VAS [place] ABI WITH/WO TBI  Result Date: 03/17/2022  LOWER EXTREMITY DOPPLER STUDY Patient Name:  [name]  Date of Exam:   03/17/2022 Medical Rec #: [id number]   Accession #:    [id number] Date of Birth: 08-23-1945   Patient Gender: M Patient Age:   17 years Exam Location[facility]  [facility] Procedure:      VAS [place] ABI WITH/WO TBI Referring Phys: Bretta Bang GONFA --------------------------------------------------------------------------------  Indications: Decreased pulses on clinical exam, history of PAD with              non-compressible/abnormal ABIs High Risk Factors: Hypertension, Diabetes, coronary artery disease.  Limitations: Today's exam was limited due to left leg pain in the setting of              cellulitis, bandaging to right upper arm. Comparison Study: 07-04-2019 Prior ABI w/ TBI was non-compressible. Right TBI                   0.76 WNL, Left TBI 0.68 Abnormal. Performing Technologist: Darlin Coco RDMS RVT  Examination Guidelines: A complete evaluation includes at minimum, Doppler waveform signals and systolic blood pressure reading at the level of bilateral brachial, anterior tibial, and posterior tibial arteries, when vessel segments are accessible. Bilateral testing is considered an integral part of a complete examination. Photoelectric Plethysmograph (PPG) waveforms and toe systolic pressure readings are included as required and additional duplex testing as needed.  Limited examinations for reoccurring indications may be performed as noted.  ABI Findings: +---------+------------------+-----+-----------+--------+ Right    Rt Pressure (mmHg)IndexWaveform   Comment  +---------+------------------+-----+-----------+--------+ Brachial 140                    triphasic           +---------+------------------+-----+-----------+--------+ PTA      255               1.82 multiphasic         +---------+------------------+-----+-----------+--------+ DP       255               1.82 multiphasic         +---------+------------------+-----+-----------+--------+ Great Toe132               0.94 Normal              +---------+------------------+-----+-----------+--------+ +---------+------------------+-----+----------+--------------------------------+ Left     Lt Pressure (mmHg)IndexWaveform  Comment                          +---------+------------------+-----+----------+--------------------------------+ Brachial                        triphasic Bandaging- unable to obtain  pressure                         +---------+------------------+-----+----------+--------------------------------+ PTA                             monophasicUnable to obtain pressure                                                  secondary to pain in the setting                                           of cellulitis                    +---------+------------------+-----+----------+--------------------------------+ DP                              biphasic  Unable to obtain pressure                                                  secondary to pain in the setting                                           of cellulitis                    +---------+------------------+-----+----------+--------------------------------+ Great Toe55                0.39 Abnormal                                    +---------+------------------+-----+----------+--------------------------------+ +-------+------------+-----------+----------------+------------+ ABI/TBIToday's ABI Today's TBIPrevious ABI    Previous TBI +-------+------------+-----------+----------------+------------+ Right  [place]          0.94       [place]              0.76         +-------+------------+-----------+----------------+------------+ Left   Unobtainable0.39       1.12 (Calcified)0.68         +-------+------------+-----------+----------------+------------+ Arterial wall calcification precludes accurate ankle pressures and ABIs.  Summary: Right: Resting right ankle-brachial index indicates noncompressible right lower extremity arteries. The right toe-brachial index is normal. Left: The left toe-brachial index is abnormal. Although pressures were not obtained secondary to patient pain in the setting of cellulitis, arterial doppler waveforms at the ankle suggest some component of arterial occlusive disease. Previous examinations appeared calcified/unreliable. *See table(s) above for measurements and observations.  Electronically signed by Jamelle Haring on 03/17/2022 at 4:48:09 PM.    Final    VAS [place] LOWER EXTREMITY VENOUS (DVT)  Result Date: 03/17/2022  Lower Venous DVT Study Patient Name:  [name]  Date of Exam:   03/17/2022 Medical Rec #: [id number]   Accession #:    [id number] Date of Birth: [birth year]  Patient Gender: M Patient Age:   36 years Exam Location:  [facility] Procedure:      VAS [place] LOWER EXTREMITY VENOUS (DVT) Referring Phys: TAYE GONFA --------------------------------------------------------------------------------  Indications: Pain, swelling, and erythema LT>RT, left leg cellulitis.  Comparison Study: 10-01-2021 Prior left lower extremity venous study was                   negative for DVT. Performing Technologist: Darlin Coco RDMS, RVT  Examination Guidelines: A complete evaluation includes B-mode imaging,  spectral Doppler, color Doppler, and power Doppler as needed of all accessible portions of each vessel. Bilateral testing is considered an integral part of a complete examination. Limited examinations for reoccurring indications may be performed as noted. The reflux portion of the exam is performed with the patient in reverse Trendelenburg.  +---------+---------------+---------+-----------+----------+--------------+ RIGHT    CompressibilityPhasicitySpontaneityPropertiesThrombus Aging +---------+---------------+---------+-----------+----------+--------------+ CFV      Full           Yes      Yes                                 +---------+---------------+---------+-----------+----------+--------------+ SFJ      Full                                                        +---------+---------------+---------+-----------+----------+--------------+ FV Prox  Full                                                        +---------+---------------+---------+-----------+----------+--------------+ FV Mid   Full                                                        +---------+---------------+---------+-----------+----------+--------------+ FV DistalFull                                                        +---------+---------------+---------+-----------+----------+--------------+ PFV      Full                                                        +---------+---------------+---------+-----------+----------+--------------+ POP      Full           Yes      Yes                                 +---------+---------------+---------+-----------+----------+--------------+ PTV      Full                                                        +---------+---------------+---------+-----------+----------+--------------+  PERO     Full                                                        +---------+---------------+---------+-----------+----------+--------------+ Gastroc   Full                                                        +---------+---------------+---------+-----------+----------+--------------+   +---------+---------------+---------+-----------+----------+-------------------+ LEFT     CompressibilityPhasicitySpontaneityPropertiesThrombus Aging      +---------+---------------+---------+-----------+----------+-------------------+ CFV      Full           Yes      Yes                                      +---------+---------------+---------+-----------+----------+-------------------+ SFJ      Full                                                             +---------+---------------+---------+-----------+----------+-------------------+ FV Prox  Full                                                             +---------+---------------+---------+-----------+----------+-------------------+ FV Mid   Full                                                             +---------+---------------+---------+-----------+----------+-------------------+ FV DistalFull                                                             +---------+---------------+---------+-----------+----------+-------------------+ PFV      Full                                                             +---------+---------------+---------+-----------+----------+-------------------+ POP      Full           Yes      Yes                                      +---------+---------------+---------+-----------+----------+-------------------+ PTV      Full                                                             +---------+---------------+---------+-----------+----------+-------------------+  PERO     Full                                         Not well visualized                                                       secondary to                                                              overlying                                                                  edema--patent by                                                          color               +---------+---------------+---------+-----------+----------+-------------------+ Gastroc  Full                                                             +---------+---------------+---------+-----------+----------+-------------------+     Summary: RIGHT: - There is no evidence of deep vein thrombosis in the lower extremity.  - No cystic structure found in the popliteal fossa.  LEFT: - There is no evidence of deep vein thrombosis in the lower extremity.  - No cystic structure found in the popliteal fossa.  *See table(s) above for measurements and observations. Electronically signed by Jamelle Haring on 03/17/2022 at 4:47:07 PM.    Final    ECHOCARDIOGRAM LIMITED  Result Date: 03/17/2022    ECHOCARDIOGRAM LIMITED REPORT   Patient Name:   [name] Date of Exam: 03/17/2022 Medical Rec #:  [id number]  Height:       70.0 in Accession #:    [id number] Weight:       215.0 lb Date of Birth:  11-Sep-1945  BSA:          2.152 m Patient Age:    32 years   BP:           133/68 mmHg Patient Gender: M          HR:           65 bpm. Exam Location:  Inpatient Procedure: Limited Echo, Limited Color Doppler and Color Doppler Indications:    other abnormalities of the heart  History:  Patient has prior history of Echocardiogram examinations, most                 recent 10/21/2021. CAD, chronic kidney disease. Sarcoidosis; Risk                 Factors:Sleep Apnea, Diabetes, Dyslipidemia and Hypertension.  Sonographer:    [place] Referring Phys: [id number] TAYE T GONFA IMPRESSIONS  1. Diffiicult acoustic windows, endocardium is difficult to see Lateral wall appears mildly hypokinetic. Would consider limited echo with Definity to help delineate. . Left ventricular ejection fraction, by estimation, is 50 to 55%. The left ventricle has low normal function. Left ventricular  diastolic parameters are consistent with Grade I diastolic dysfunction (impaired relaxation).  2. Right ventricular systolic function is low normal. The right ventricular size is normal.  3. Trivial mitral valve regurgitation.  4. The inferior vena cava is dilated in size with <50% respiratory variability, suggesting right atrial pressure of 15 mmHg. FINDINGS  Left Ventricle: Diffiicult acoustic windows, endocardium is difficult to see Lateral wall appears mildly hypokinetic. Would consider limited echo with Definity to help delineate. Left ventricular ejection fraction, by estimation, is 50 to 55%. The left ventricle has low normal function. The left ventricular internal cavity size was normal in size. There is no left ventricular hypertrophy. Left ventricular diastolic parameters are consistent with Grade I diastolic dysfunction (impaired relaxation). Right Ventricle: The right ventricular size is normal. Right ventricular systolic function is low normal. Pericardium: There is no evidence of pericardial effusion. Mitral Valve: There is mild thickening of the mitral valve leaflet(s). Mild mitral annular calcification. Trivial mitral valve regurgitation. Tricuspid Valve: The tricuspid valve is normal in structure. Tricuspid valve regurgitation is trivial. Pulmonic Valve: The pulmonic valve was normal in structure. Pulmonic valve regurgitation is mild. Venous: The inferior vena cava is dilated in size with less than 50% respiratory variability, suggesting right atrial pressure of 15 mmHg. Additional Comments: Spectral Doppler performed. Color Doppler performed.  LEFT VENTRICLE PLAX 2D LVIDd:         5.30 cm      Diastology LVIDs:         3.90 cm      LV e' medial:    4.57 cm/s LV PW:         1.00 cm      LV E/e' medial:  20.7 LV IVS:        0.90 cm      LV e' lateral:   5.77 cm/s LVOT diam:     2.00 cm      LV E/e' lateral: 16.4 LV SV:         58 LV SV Index:   27 LVOT Area:     3.14 cm  LV Volumes (MOD) LV vol d,  MOD A4C: 109.0 ml LV vol s, MOD A4C: 60.4 ml LV SV MOD A4C:     109.0 ml IVC IVC diam: 2.00 cm LEFT ATRIUM         Index LA diam:    3.80 cm 1.77 cm/m  AORTIC VALVE LVOT Vmax:   86.20 cm/s LVOT Vmean:  57.300 cm/s LVOT VTI:    0.186 m  AORTA Ao Asc diam: 3.00 cm MITRAL VALVE MV Area (PHT): 4.24 cm    SHUNTS MV Decel Time: 179 msec    Systemic VTI:  0.19 m MV E velocity: 94.50 cm/s  Systemic Diam: 2.00 cm MV A velocity: 91.90 cm/s MV E/A ratio:  1.03 Dorris Carnes  MD Electronically signed by Dorris Carnes MD Signature Date/Time: 03/17/2022/9:09:39 AM    Final         Scheduled Meds:  aspirin EC  81 mg Oral QPM   clopidogrel  75 mg Oral Daily   enoxaparin (LOVENOX) injection  40 mg Subcutaneous Q24H   feeding supplement (GLUCERNA SHAKE)  237 mL Oral TID BM   insulin aspart  0-20 Units Subcutaneous TID WC   insulin aspart  0-5 Units Subcutaneous QHS   insulin aspart  10 Units Subcutaneous TID WC   insulin glargine-yfgn  28 Units Subcutaneous BID   levothyroxine  75 mcg Oral Q0600   metoprolol tartrate  12.5 mg Oral BID   multivitamin with minerals  1 tablet Oral Daily   pantoprazole  40 mg Oral Daily   predniSONE  10 mg Oral Q breakfast   rosuvastatin  20 mg Oral QHS   sodium chloride flush  3 mL Intravenous Q12H   Continuous Infusions:  sodium chloride      ceFAZolin (ANCEF) IV 2 g (03/19/22 0013)     LOS: 4 days   Time spent= 35 mins    Calder Oblinger Arsenio Loader, MD [facility]  If 7PM-7AM, please contact night-coverage  03/19/2022, 8:02 AM

## 2022-03-20 DIAGNOSIS — R6520 Severe sepsis without septic shock: Secondary | ICD-10-CM | POA: Diagnosis not present

## 2022-03-20 DIAGNOSIS — A419 Sepsis, unspecified organism: Secondary | ICD-10-CM | POA: Diagnosis not present

## 2022-03-20 LAB — BASIC METABOLIC PANEL
Anion gap: 8 (ref 5–15)
BUN: 16 mg/dL (ref 8–23)
CO2: 28 mmol/L (ref 22–32)
Calcium: 7.9 mg/dL — ABNORMAL LOW (ref 8.9–10.3)
Chloride: 102 mmol/L (ref 98–111)
Creatinine, Ser: 1.63 mg/dL — ABNORMAL HIGH (ref 0.61–1.24)
GFR, Estimated: 43 mL/min — ABNORMAL LOW (ref >60)
Glucose, Bld: 149 mg/dL — ABNORMAL HIGH (ref 70–99)
Potassium: 3.3 mmol/L — ABNORMAL LOW (ref 3.5–5.1)
Sodium: 138 mmol/L (ref 135–145)

## 2022-03-20 LAB — GLUCOSE, CAPILLARY
Glucose-Capillary: 125 mg/dL — ABNORMAL HIGH (ref 70–99)
Glucose-Capillary: 312 mg/dL — ABNORMAL HIGH (ref 70–99)
Glucose-Capillary: 235 mg/dL — ABNORMAL HIGH (ref 70–99)
Glucose-Capillary: 208 mg/dL — ABNORMAL HIGH (ref 70–99)

## 2022-03-20 LAB — CBC
HCT: 35.8 % — ABNORMAL LOW (ref 39.0–52.0)
Hemoglobin: 11.1 g/dL — ABNORMAL LOW (ref 13.0–17.0)
MCH: 28.2 pg (ref 26.0–34.0)
MCHC: 31 g/dL (ref 30.0–36.0)
MCV: 90.9 fL (ref 80.0–100.0)
Platelets: 238 10*3/uL (ref 150–400)
RBC: 3.94 MIL/uL — ABNORMAL LOW (ref 4.22–5.81)
RDW: 18.7 % — ABNORMAL HIGH (ref 11.5–15.5)
WBC: 13.7 10*3/uL — ABNORMAL HIGH (ref 4.0–10.5)
nRBC: 0 % (ref 0.0–0.2)

## 2022-03-20 LAB — CULTURE, BLOOD (ROUTINE X 2)
Culture: NO GROWTH
Culture: NO GROWTH

## 2022-03-20 LAB — MAGNESIUM: Magnesium: 1.8 mg/dL (ref 1.7–2.4)

## 2022-03-20 LAB — BRAIN NATRIURETIC PEPTIDE: B Natriuretic Peptide: 778.5 pg/mL — ABNORMAL HIGH (ref 0.0–100.0)

## 2022-03-20 MED ORDER — POTASSIUM CHLORIDE 20 MEQ PO PACK
40.0000 meq | PACK | ORAL | Status: DC
  Administered 2022-03-20: 40 meq via ORAL
  Filled 2022-03-20: qty 2

## 2022-03-20 MED ORDER — EZETIMIBE 10 MG PO TABS
10.0000 mg | ORAL_TABLET | Freq: Every morning | ORAL | Status: DC
  Administered 2022-03-20 – 2022-03-21 (×2): 10 mg via ORAL
  Filled 2022-03-20 (×2): qty 1

## 2022-03-20 MED ORDER — HYDRALAZINE HCL 10 MG PO TABS
10.0000 mg | ORAL_TABLET | Freq: Three times a day (TID) | ORAL | Status: DC
  Administered 2022-03-20 – 2022-03-21 (×4): 10 mg via ORAL
  Filled 2022-03-20 (×6): qty 1

## 2022-03-20 MED ORDER — FUROSEMIDE 10 MG/ML IJ SOLN
20.0000 mg | Freq: Once | INTRAMUSCULAR | Status: AC
  Administered 2022-03-20: 20 mg via INTRAVENOUS
  Filled 2022-03-20: qty 2

## 2022-03-20 MED ORDER — POTASSIUM CHLORIDE CRYS ER 20 MEQ PO TBCR
40.0000 meq | EXTENDED_RELEASE_TABLET | Freq: Once | ORAL | Status: AC
  Administered 2022-03-20: 40 meq via ORAL
  Filled 2022-03-20: qty 2

## 2022-03-20 NOTE — Progress Notes (Signed)
PROGRESS NOTE    [name]  PNT:[id number] DOB: 20-Aug-1945 DOA: 03/15/2022 PCP: Shirline Frees, MD   Brief Narrative:  77 year old M with PMH of CAD, diastolic CHF, OSA not on CPAP, adrenal insufficiency on prednisone, sarcoidosis, DM-2, CKD-3, PAD, HTN, hypothyroidism, anemia, obesity and GERD presenting with generalized weakness with associated nausea, vomiting, myalgia, fever, productive cough with blood-tinged phlegm and epistaxis for about a week and admitted for severe sepsis in the setting of cellulitis, possible pneumonia and rule out bacteremia. Recently treated with Z-Pak and steroid by PCP for possible pneumonia.  Upon admission there was concerns of right upper lobe pneumonia and left lower extremity cellulitis.  Venous Dopplers was negative for DVT.  Patient started on IV vancomycin and cefepime.  Cultures were drawn which remains negative, antibiotics were de-escalated to IV Ancef.  Vascular was consulted due to concerns of pain and PAD.  At this time recommended conservative management, aspirin and statin and outpatient follow-up.   Assessment & Plan:  Principal Problem:   Severe sepsis ([place]) Active Problems:   Sarcoidosis   GERD   OSA (obstructive sleep apnea)   History of adrenal insufficiency   Coronary atherosclerosis of native coronary artery   Chronic diastolic heart failure (HCC)   Obesity   Pulmonary sarcoidosis ([place])   Uncontrolled type 2 diabetes mellitus with hyperglycemia, with long-term current use of insulin (HCC)   Hyperlipidemia   Normocytic anemia   Hypothyroidism   Peripheral arterial disease (HCC)   Acute renal failure superimposed on stage 3a chronic kidney disease (HCC)   HTN (hypertension)   Cellulitis of left lower extremity   Right upper lobe pneumonia    Severe sepsis due to left lower extremity cellulitis and possible RUL pneumonia: POA.  Sepsis physiology resolved, swelling improving.  Continue IV Ancef. Solu-Medrol discontinued, on home  daily prednisone.  Takes 10 mg orally daily at home.   AKI on CKD-3A:  Likely prerenal from GI loss and concurrent use of diuretics and acute illness.  Baseline creatinine 1.1, today 1.63.  Continue gentle hydration   Acute on chronic diastolic CHF:  Echo shows EF 55%, grade 1 DD. BNP elevated with some abnormal breath sounds this morning.  Will give Lasix 20 mg IV    Uncontrolled IDDM-2 with hyperglycemia and CKD-3A: A1c 10% in 09/2021.   On 70/30 at home. Currently on sliding scale, Accu-Chek, long-acting insulin.  Adjusting Semglee and Premeal insulin   History of CAD/PAD/HLD: S/p 3 DES in November 2022.   No chest pain.  Preliminary on ABI nonconclusive on the right and unobtainable on the left.  Currently on aspirin, Plavix, statin, metoprolol.   Hypertension: Uncontrolled Continue metoprolol.  Resume hydralazine IV as needed.   Adrenal insufficiency:  On prednisone 10 mg daily at home.   OSA not on CPAP-under evaluation for CPAP. -Trying nightly CPAP here.   Hypokalemia/hypomagnesemia Replete as needed   Generalized weakness/physical deconditioning/chronic back pain/radiculopathy -PT/OT eval   Epistaxis:  Subsided, hemoglobin stable   GERD -Continue PPI   History of pulmonary sarcoid: Stable on CT.   Obesity Body mass index is 30.85 kg/m.  Hypokalemia - Repletion as needed  PT/OT-home health   DVT prophylaxis: Lovenox  Code Status: Full code-confirmed with patient and wife. Family Communication: Spouse at bedside Level of care: Med-Surg Status is: Inpatient Remains inpatient appropriate because: Continue IV antibiotics today.  Hopefully home tomorrow   Subjective: Feels a little better, still having left lower extremity pain especially with weight on it.  Examination: Constitutional: Not in acute distress Respiratory: Clear to auscultation bilaterally Cardiovascular: Normal sinus rhythm, no rubs Abdomen: Nontender nondistended good bowel  sounds Musculoskeletal: No edema noted Skin: Left lower extremity erythema, warmth Neurologic: CN 2-12 grossly intact.  And nonfocal Psychiatric: Normal judgment and insight. Alert and oriented x 3. Normal mood.   Objective: Vitals:   03/20/22 0437 03/20/22 0532 03/20/22 0546 03/20/22 0727  BP: (!) 189/92  (!) 155/72 (!) 153/93  Pulse: 76  74 76  Resp:    16  Temp:    98.1 F (36.7 C)  TempSrc:    Oral  SpO2:    95%  Weight:  107.9 kg    Height:        Intake/Output Summary (Last 24 hours) at 03/20/2022 0744 Last data filed at 03/19/2022 1859 Gross per 24 hour  Intake 1397.42 ml  Output 700 ml  Net 697.42 ml   Filed Weights   03/15/22 1123 03/20/22 0532  Weight: 97.5 kg 107.9 kg     Data Reviewed:   CBC: Recent Labs  Lab 03/15/22 1225 03/16/22 0458 03/17/22 0317 03/18/22 0412 03/19/22 0232 03/20/22 0250  WBC 34.3* 32.2* 26.6* 14.9* 13.1* 13.7*  NEUTROABS 30.8*  --  24.7*  --   --   --   HGB 14.0 12.1* 10.9* 11.4* 11.0* 11.1*  HCT 45.8 38.7* 35.0* 37.5* 35.0* 35.8*  MCV 91.6 92.1 91.1 90.8 89.5 90.9  PLT 305 243 208 220 226 841   Basic Metabolic Panel: Recent Labs  Lab 03/16/22 0458 03/17/22 0317 03/18/22 0412 03/19/22 0232 03/20/22 0250  NA 134* 133* 135 136 138  K 4.0 4.1 3.2* 3.4* 3.3*  CL 97* 99 99 100 102  CO2 '25 24 25 26 28  '$ GLUCOSE 313* 297* 198* 221* 149*  BUN 27* 37* 30* 22 16  CREATININE 1.51* 1.70* 1.58* 1.79* 1.63*  CALCIUM 7.8* 8.1* 8.2* 7.8* 7.9*  MG 2.0 2.2 2.1 1.9 1.8  PHOS  --  2.7 2.1*  --   --    GFR: Estimated Creatinine Clearance: 47.4 mL/min (A) (by C-G formula based on SCr of 1.63 mg/dL (H)). Liver Function Tests: Recent Labs  Lab 03/15/22 1225 03/16/22 0458 03/17/22 0317 03/18/22 0412  AST 48* 46* 32  --   ALT 38 38 31  --   ALKPHOS 60 54 63  --   BILITOT 1.0 0.7 0.7  --   PROT 6.7 5.5* 5.7*  --   ALBUMIN 3.1* 2.4* 2.2* 2.2*   No results for input(s): "LIPASE", "AMYLASE" in the last 168 hours. No results for  input(s): "AMMONIA" in the last 168 hours. Coagulation Profile: Recent Labs  Lab 03/15/22 1225 03/16/22 0458  INR 1.1 1.3*   Cardiac Enzymes: Recent Labs  Lab 03/16/22 0458 03/17/22 0317  CKTOTAL 151 69   BNP (last 3 results) No results for input(s): "PROBNP" in the last 8760 hours. HbA1C: No results for input(s): "HGBA1C" in the last 72 hours.  CBG: Recent Labs  Lab 03/19/22 0906 03/19/22 1141 03/19/22 1703 03/19/22 2053 03/20/22 0725  GLUCAP 226* 327* 256* 211* 125*   Lipid Profile: Recent Labs    03/18/22 0412  CHOL 87  HDL 20*  LDLCALC 33  TRIG 172*  CHOLHDL 4.4   Thyroid Function Tests: No results for input(s): "TSH", "T4TOTAL", "FREET4", "T3FREE", "THYROIDAB" in the last 72 hours. Anemia Panel: No results for input(s): "VITAMINB12", "FOLATE", "FERRITIN", "TIBC", "IRON", "RETICCTPCT" in the last 72 hours. Sepsis Labs: Recent Labs  Lab  03/15/22 1225 03/15/22 1333 03/16/22 0458 03/17/22 0317 03/18/22 0412  PROCALCITON  --   --  31.56 20.13 8.80  LATICACIDVEN 2.8* 1.7  --   --   --     Recent Results (from the past 240 hour(s))  Resp panel by RT-PCR (RSV, Flu A&B, Covid) Anterior Nasal Swab     Status: None   Collection Time: 03/15/22 11:33 AM   Specimen: Anterior Nasal Swab  Result Value Ref Range Status   SARS Coronavirus 2 by RT PCR NEGATIVE NEGATIVE Final    Comment: (NOTE) SARS-CoV-2 target nucleic acids are NOT DETECTED.  The SARS-CoV-2 RNA is generally detectable in upper respiratory specimens during the acute phase of infection. The lowest concentration of SARS-CoV-2 viral copies this assay can detect is 138 copies/mL. A negative result does not preclude SARS-Cov-2 infection and should not be used as the sole basis for treatment or other patient management decisions. A negative result may occur with  improper specimen collection/handling, submission of specimen other than nasopharyngeal swab, presence of viral mutation(s) within  the areas targeted by this assay, and inadequate number of viral copies(<138 copies/mL). A negative result must be combined with clinical observations, patient history, and epidemiological information. The expected result is Negative.  Fact Sheet for Patients:  EntrepreneurPulse.com.au  Fact Sheet for Healthcare Providers:  IncredibleEmployment.be  This test is no t yet approved or cleared by the [place] FDA and  has been authorized for detection and/or diagnosis of SARS-CoV-2 by FDA under an Emergency Use Authorization (EUA). This EUA will remain  in effect (meaning this test can be used) for the duration of the COVID-19 declaration under Section 564(b)(1) of the Act, 21 U.S.C.section 360bbb-3(b)(1), unless the authorization is terminated  or revoked sooner.       Influenza A by PCR NEGATIVE NEGATIVE Final   Influenza B by PCR NEGATIVE NEGATIVE Final    Comment: (NOTE) The Xpert Xpress SARS-CoV-2/FLU/RSV plus assay is intended as an aid in the diagnosis of influenza from Nasopharyngeal swab specimens and should not be used as a sole basis for treatment. Nasal washings and aspirates are unacceptable for Xpert Xpress SARS-CoV-2/FLU/RSV testing.  Fact Sheet for Patients: EntrepreneurPulse.com.au  Fact Sheet for Healthcare Providers: IncredibleEmployment.be  This test is not yet approved or cleared by the [place] FDA and has been authorized for detection and/or diagnosis of SARS-CoV-2 by FDA under an Emergency Use Authorization (EUA). This EUA will remain in effect (meaning this test can be used) for the duration of the COVID-19 declaration under Section 564(b)(1) of the Act, 21 U.S.C. section 360bbb-3(b)(1), unless the authorization is terminated or revoked.     Resp Syncytial Virus by PCR NEGATIVE NEGATIVE Final    Comment: (NOTE) Fact Sheet for  Patients: EntrepreneurPulse.com.au  Fact Sheet for Healthcare Providers: IncredibleEmployment.be  This test is not yet approved or cleared by the [place] FDA and has been authorized for detection and/or diagnosis of SARS-CoV-2 by FDA under an Emergency Use Authorization (EUA). This EUA will remain in effect (meaning this test can be used) for the duration of the COVID-19 declaration under Section 564(b)(1) of the Act, 21 U.S.C. section 360bbb-3(b)(1), unless the authorization is terminated or revoked.  Performed at [facility], [place] [address] [place], [place] [address]   Urine Culture     Status: Abnormal   Collection Time: 03/15/22 11:33 AM   Specimen: In/Out Cath Urine  Result Value Ref Range Status   Specimen Description IN/OUT CATH URINE  Final   Special Requests   Final    NONE Performed at [facility], [place] [address] [place], [place] [address]    Culture MULTIPLE SPECIES PRESENT, SUGGEST RECOLLECTION (A)  Final   Report Status 03/16/2022 FINAL  Final  Respiratory (~20 pathogens) panel by PCR     Status: None   Collection Time: 03/15/22 11:33 AM   Specimen: Nasopharyngeal Swab; Respiratory  Result Value Ref Range Status   Adenovirus NOT DETECTED NOT DETECTED Final   Coronavirus 229E NOT DETECTED NOT DETECTED Final    Comment: (NOTE) The Coronavirus on the Respiratory Panel, DOES NOT test for the novel  Coronavirus (2019 nCoV)    Coronavirus HKU1 NOT DETECTED NOT DETECTED Final   Coronavirus NL63 NOT DETECTED NOT DETECTED Final   Coronavirus OC43 NOT DETECTED NOT DETECTED Final   Metapneumovirus NOT DETECTED NOT DETECTED Final   Rhinovirus / Enterovirus NOT DETECTED NOT DETECTED Final   Influenza A NOT DETECTED NOT DETECTED Final   Influenza B NOT DETECTED NOT DETECTED Final   Parainfluenza Virus 1 NOT DETECTED NOT DETECTED Final   Parainfluenza Virus 2 NOT DETECTED NOT DETECTED Final   Parainfluenza Virus 3  NOT DETECTED NOT DETECTED Final   Parainfluenza Virus 4 NOT DETECTED NOT DETECTED Final   Respiratory Syncytial Virus NOT DETECTED NOT DETECTED Final   Bordetella pertussis NOT DETECTED NOT DETECTED Final   Bordetella Parapertussis NOT DETECTED NOT DETECTED Final   Chlamydophila pneumoniae NOT DETECTED NOT DETECTED Final   Mycoplasma pneumoniae NOT DETECTED NOT DETECTED Final    Comment: Performed at [facility], [place]. [address] [place], [place] [address]  Blood Culture (routine x 2)     Status: None (Preliminary result)   Collection Time: 03/15/22 12:25 PM   Specimen: BLOOD LEFT FOREARM  Result Value Ref Range Status   Specimen Description BLOOD LEFT FOREARM  Final   Special Requests   Final    BOTTLES DRAWN AEROBIC AND ANAEROBIC Blood Culture results may not be optimal due to an inadequate volume of blood received in culture bottles   Culture   Final    NO GROWTH 4 DAYS Performed at [facility], [place] [address] [place], [place] [address]    Report Status PENDING  Incomplete  Blood Culture (routine x 2)     Status: None (Preliminary result)   Collection Time: 03/15/22 12:25 PM   Specimen: BLOOD RIGHT FOREARM  Result Value Ref Range Status   Specimen Description BLOOD RIGHT FOREARM  Final   Special Requests   Final    BOTTLES DRAWN AEROBIC AND ANAEROBIC Blood Culture results may not be optimal due to an inadequate volume of blood received in culture bottles   Culture   Final    NO GROWTH 4 DAYS Performed at [facility], [place] [address] [place], [place] [address]    Report Status PENDING  Incomplete  MRSA Next Gen by PCR, Nasal     Status: None   Collection Time: 03/17/22  7:17 AM   Specimen: Nasal Mucosa; Nasal Swab  Result Value Ref Range Status   MRSA by PCR Next Gen NOT DETECTED NOT DETECTED Final    Comment: (NOTE) The GeneXpert MRSA Assay (FDA approved for NASAL specimens only), is one component of a comprehensive MRSA colonization  surveillance program. It is not intended to diagnose MRSA infection nor to guide or monitor treatment for MRSA infections. Test performance is not FDA approved in patients less than  13 years old. Performed at [facility], [place] [address], [place], [place] [address]          Radiology Studies: No results found.      Scheduled Meds:  aspirin EC  81 mg Oral QPM   clopidogrel  75 mg Oral Daily   enoxaparin (LOVENOX) injection  40 mg Subcutaneous Q24H   ezetimibe  10 mg Oral q AM   feeding supplement (GLUCERNA SHAKE)  237 mL Oral TID BM   furosemide  20 mg Intravenous Once   hydrALAZINE  10 mg Oral Q8H   insulin aspart  0-20 Units Subcutaneous TID WC   insulin aspart  0-5 Units Subcutaneous QHS   insulin aspart  10 Units Subcutaneous TID WC   insulin glargine-yfgn  28 Units Subcutaneous BID   levothyroxine  75 mcg Oral Q0600   metoprolol tartrate  12.5 mg Oral BID   multivitamin with minerals  1 tablet Oral Daily   pantoprazole  40 mg Oral Daily   potassium chloride  40 mEq Oral Q4H   predniSONE  10 mg Oral Q breakfast   rosuvastatin  20 mg Oral QHS   sodium chloride flush  3 mL Intravenous Q12H   Continuous Infusions:   ceFAZolin (ANCEF) IV 2 g (03/20/22 0056)     LOS: 5 days   Time spent= 35 mins    Emilya Justen Arsenio Loader, MD [facility]  If 7PM-7AM, please contact night-coverage  03/20/2022, 7:44 AM

## 2022-03-20 NOTE — Progress Notes (Signed)
Pt declined the use of CPAP because he does not tolerate our machines, no machine in room.

## 2022-03-21 ENCOUNTER — Other Ambulatory Visit (HOSPITAL_COMMUNITY): Payer: Self-pay

## 2022-03-21 DIAGNOSIS — A419 Sepsis, unspecified organism: Secondary | ICD-10-CM | POA: Diagnosis not present

## 2022-03-21 DIAGNOSIS — R6520 Severe sepsis without septic shock: Secondary | ICD-10-CM | POA: Diagnosis not present

## 2022-03-21 LAB — CBC
HCT: 42.4 % (ref 39.0–52.0)
Hemoglobin: 13.1 g/dL (ref 13.0–17.0)
MCH: 28.1 pg (ref 26.0–34.0)
MCHC: 30.9 g/dL (ref 30.0–36.0)
MCV: 91 fL (ref 80.0–100.0)
Platelets: 235 10*3/uL (ref 150–400)
RBC: 4.66 MIL/uL (ref 4.22–5.81)
RDW: 19.1 % — ABNORMAL HIGH (ref 11.5–15.5)
WBC: 15.6 10*3/uL — ABNORMAL HIGH (ref 4.0–10.5)
nRBC: 0 % (ref 0.0–0.2)

## 2022-03-21 LAB — GLUCOSE, CAPILLARY: Glucose-Capillary: 109 mg/dL — ABNORMAL HIGH (ref 70–99)

## 2022-03-21 LAB — BASIC METABOLIC PANEL
Anion gap: 13 (ref 5–15)
BUN: 16 mg/dL (ref 8–23)
CO2: 26 mmol/L (ref 22–32)
Calcium: 8.3 mg/dL — ABNORMAL LOW (ref 8.9–10.3)
Chloride: 99 mmol/L (ref 98–111)
Creatinine, Ser: 1.6 mg/dL — ABNORMAL HIGH (ref 0.61–1.24)
GFR, Estimated: 44 mL/min — ABNORMAL LOW (ref >60)
Glucose, Bld: 99 mg/dL (ref 70–99)
Potassium: 3.9 mmol/L (ref 3.5–5.1)
Sodium: 138 mmol/L (ref 135–145)

## 2022-03-21 LAB — MAGNESIUM: Magnesium: 1.8 mg/dL (ref 1.7–2.4)

## 2022-03-21 MED ORDER — HYDRALAZINE HCL 25 MG PO TABS
25.0000 mg | ORAL_TABLET | Freq: Three times a day (TID) | ORAL | 0 refills | Status: DC
  Filled 2022-03-21: qty 90, 30d supply, fill #0

## 2022-03-21 MED ORDER — AMOXICILLIN-POT CLAVULANATE 875-125 MG PO TABS
1.0000 | ORAL_TABLET | Freq: Two times a day (BID) | ORAL | 0 refills | Status: AC
  Filled 2022-03-21: qty 28, 14d supply, fill #0

## 2022-03-21 NOTE — TOC Transition Note (Signed)
Transition of Care [facility]) - CM/SW Discharge Note   Patient Details  Name: [name] MRN: [id number] Date of Birth: 1945/07/30  [facility] CM/SW Contact:  Curlene Labrum, RN Phone Number: 03/21/2022, 10:39 AM   Clinical Narrative:    CM met with the patient at the bedside to offer Medicare choice regarding home health agency and he did not have a preference.  HH orders for PT placed.  Alvis Lemmings was called to place referral.   Final next level of care: Home/Self Care Barriers to Discharge: Continued Medical Work up   Patient Goals and CMS Choice CMS Medicare.gov Compare Post Acute Care list provided to:: Patient Choice offered to / list presented to : Patient  Discharge Placement                         Discharge Plan and Services Additional resources added to the After Visit Summary for     Discharge Planning Services: CM Consult            DME Arranged:  (DME at home includes electric scooter, RW, Rolator, ramp,)                    Social Determinants of Health (SDOH) Interventions SDOH Screenings   Food Insecurity: No Food Insecurity (10/21/2021)  Housing: Low Risk  (10/21/2021)  Transportation Needs: No Transportation Needs (10/21/2021)  Tobacco Use: Low Risk  (03/15/2022)     Readmission Risk Interventions    03/18/2022    9:07 AM  Readmission Risk Prevention Plan  Transportation Screening Complete  Medication Review ([place]) Complete  PCP or Specialist appointment within 3-5 days of discharge Complete  HRI or [place] Complete  SW Recovery Care/Counseling Consult Complete  Palliative Care Screening Complete  Skilled Nursing Facility Complete

## 2022-03-21 NOTE — Plan of Care (Signed)
Discharge instructions discussed with patient.  Patient instructed on home medications, restrictions, and follow up appointments. Belongings gathered and sent with patient.

## 2022-03-21 NOTE — Progress Notes (Signed)
Patient discharged. Patient verbalized understanding.

## 2022-03-21 NOTE — Discharge Summary (Signed)
Physician Discharge Summary  [name]:[id number] DOB: 1945/05/27 DOA: 03/15/2022  PCP: Shirline Frees, MD  Admit date: 03/15/2022 Discharge date: 03/21/2022  Admitted From: Home Disposition: Home with health  Recommendations for Outpatient Follow-up:  Follow up with PCP in 1-2 weeks Please obtain BMP/CBC in one week your next doctors visit.  2 weeks of oral Augmentin Hydralazine increased to 25 mg daily. Advised to trach aspirin, Plavix and statin Follow-up outpatient vascular in next 3-4 weeks   Discharge Condition: Stable CODE STATUS: Full code Diet recommendation: Heart healthy, diabetic  Brief/Interim Summary:  77 year old M with PMH of CAD, diastolic CHF, OSA not on CPAP, adrenal insufficiency on prednisone, sarcoidosis, DM-2, CKD-3, PAD, HTN, hypothyroidism, anemia, obesity and GERD presenting with generalized weakness with associated nausea, vomiting, myalgia, fever, productive cough with blood-tinged phlegm and epistaxis for about a week and admitted for severe sepsis in the setting of cellulitis, possible pneumonia and rule out bacteremia. Recently treated with Z-Pak and steroid by PCP for possible pneumonia. Upon admission there was concerns of right upper lobe pneumonia and left lower extremity cellulitis. Venous Dopplers was negative for DVT. Patient started on IV vancomycin and cefepime. Cultures were drawn which remains negative, antibiotics were de-escalated to IV Ancef. Vascular was consulted due to concerns of pain and PAD. At this time recommended conservative management, aspirin and statin and outpatient follow-up.  Over the course of several days patient did well on IV antibiotic.  Cellulitis was improving.  Today patient will be discharged on 2 weeks of oral Augmentin with rest of the recommendations as stated above.    Severe sepsis due to left lower extremity cellulitis and possible RUL pneumonia: POA.  Sepsis physiology resolved, swelling improving.  Transition  to p.o. Augmentin for 2 more weeks. Solu-Medrol discontinued, on home daily prednisone.  Takes 10 mg orally daily at home.   AKI on CKD-3A:  Likely prerenal from GI loss and concurrent use of diuretics and acute illness.  Baseline creatinine 1.1, today 1.60.  Follow-up with outpatient PCP and have repeat blood work done in next 1-2 weeks   Acute on chronic diastolic CHF:  Echo shows EF 55%, grade 1 DD. Did have elevated BNP therefore received 1 dose of IV Lasix.  Today appears to be euvolemic     Uncontrolled IDDM-2 with hyperglycemia and CKD-3A: A1c 10% in 09/2021.   On 70/30 at home. Resume home medication   History of CAD/PAD/HLD: S/p 3 DES in November 2022.   No chest pain.  Preliminary on ABI nonconclusive on the right and unobtainable on the left.  Currently on aspirin, Plavix, statin, metoprolol. Outpatient follow-up with vascular   Hypertension: Uncontrolled Continue metoprolol.  Hydralazine increased to 25 mg    Adrenal insufficiency:  On prednisone 10 mg daily at home.   OSA not on CPAP-under evaluation for CPAP. -Trying nightly CPAP here.   Hypokalemia/hypomagnesemia Replete as needed   Generalized weakness/physical deconditioning/chronic back pain/radiculopathy -PT/OT eval   Epistaxis:  Subsided, hemoglobin stable   GERD -Continue PPI   History of pulmonary sarcoid: Stable on CT.   Obesity Body mass index is 30.85 kg/m.   Hypokalemia - Repletion as needed   PT/OT-home health  Discharge Diagnoses:  Principal Problem:   Severe sepsis ([place]) Active Problems:   Sarcoidosis   GERD   OSA (obstructive sleep apnea)   History of adrenal insufficiency   Coronary atherosclerosis of native coronary artery   Chronic diastolic heart failure (HCC)   Obesity   Pulmonary sarcoidosis (  [place])   Uncontrolled type 2 diabetes mellitus with hyperglycemia, with long-term current use of insulin (HCC)   Hyperlipidemia   Normocytic anemia   Hypothyroidism   Peripheral  arterial disease ([place])   Acute renal failure superimposed on stage 3a chronic kidney disease (HCC)   HTN (hypertension)   Cellulitis of left lower extremity   Right upper lobe pneumonia      Consultations: Vascular  Subjective: Doing well no complaints.  Wife is at bedside.  Discharge Exam: Vitals:   03/20/22 2002 03/21/22 0540  BP: 135/76 (!) 167/74  Pulse: 65 72  Resp: 18 18  Temp: 98.2 F (36.8 C) 98.5 F (36.9 C)  SpO2: 96% 97%   Vitals:   03/20/22 1617 03/20/22 1920 03/20/22 2002 03/21/22 0540  BP: 135/72 (!) 153/71 135/76 (!) 167/74  Pulse: 62 65 65 72  Resp: '16 18 18 18  '$ Temp: (!) 97.4 F (36.3 C) 99 F (37.2 C) 98.2 F (36.8 C) 98.5 F (36.9 C)  TempSrc: Oral Oral Oral Oral  SpO2: 97% 98% 96% 97%  Weight:      Height:        General: Pt is alert, awake, not in acute distress Cardiovascular: RRR, S1/S2 +, no rubs, no gallops Respiratory: CTA bilaterally, no wheezing, no rhonchi Abdominal: Soft, NT, ND, bowel sounds + Extremities: no edema, no cyanosis Left foot some erythema noted but significantly improved.  No warmth, pain or discomfort.  Discharge Instructions  Discharge Instructions     Face-to-face encounter (required for Medicare/Medicaid patients)   Complete by: As directed    I Evoleht Hovatter Chirag Mackinsey Pelland certify that this patient is under my care and that I, or a nurse practitioner or physician's assistant working with me, had a face-to-face encounter that meets the physician face-to-face encounter requirements with this patient on 03/21/2022. The encounter with the patient was in whole, or in part for the following medical condition(s) which is the primary reason for home health care (List medical condition): weakness and LLE cellulitis   The encounter with the patient was in whole, or in part, for the following medical condition, which is the primary reason for home health care: weakness, LLE cellulitis   I certify that, based on my findings, the  following services are medically necessary home health services:  Physical therapy Nursing     Reason for Medically Necessary Home Health Services: Skilled Nursing- Teaching of Disease Process/Symptom Management   My clinical findings support the need for the above services: Unsafe ambulation due to balance issues   Further, I certify that my clinical findings support that this patient is homebound due to: Ambulates short distances less than 300 feet   Home Health   Complete by: As directed    To provide the following care/treatments:  PT OT RN Home Health Aide        Allergies as of 03/21/2022       Reactions   Statins Other (See Comments)   Aggression  Pt ok to take crestor '20mg'$  once a week   Hydrocortisone Nausea Only   Ranexa [ranolazine] Other (See Comments)   Severe weakness/near syncope after 1 dose Hallucinations    Hydrocodone Nausea And Vomiting   Miralax [polyethylene Glycol] Nausea And Vomiting   Z-pak [azithromycin] Other (See Comments)   Raises blood sugar   Zaroxolyn [metolazone] Other (See Comments)   Drained, no energy        Medication List     STOP taking these medications  azithromycin 250 MG tablet Commonly known as: ZITHROMAX       TAKE these medications    acetaminophen 500 MG tablet Commonly known as: TYLENOL Take 1,000 mg by mouth daily as needed for mild pain (pain).   albuterol (2.5 MG/3ML) 0.083% nebulizer solution Commonly known as: PROVENTIL Take 3 mLs (2.5 mg total) by nebulization every 6 (six) hours as needed for wheezing or shortness of breath. Dx Code D86.0   amoxicillin-clavulanate 875-125 MG tablet Commonly known as: AUGMENTIN Take 1 tablet by mouth 2 (two) times daily for 14 days.   aspirin EC 81 MG tablet Take 81 mg by mouth every evening.   clopidogrel 75 MG tablet Commonly known as: PLAVIX Take 1 tablet (75 mg total) by mouth daily. Restart on 09/28/21 What changed:  when to take this additional  instructions   ezetimibe 10 MG tablet Commonly known as: ZETIA Take 10 mg by mouth in the morning.   furosemide 80 MG tablet Commonly known as: LASIX Take 1 tablet (80 mg total) by mouth daily as needed for edema (take Lasix 80 mg as needed if gains more than 3 pounds in 1 day or 5 pounds in 1 week). What changed:  when to take this additional instructions   hydrALAZINE 25 MG tablet Commonly known as: APRESOLINE Take 1 tablet (25 mg total) by mouth 3 (three) times daily. What changed:  medication strength how much to take when to take this   insulin aspart protamine- aspart (70-30) 100 UNIT/ML injection Commonly known as: NOVOLOG MIX 70/30 Inject 0.4 mLs (40 Units total) into the skin with breakfast, with lunch, and with evening meal. What changed:  how much to take when to take this   Iron (Ferrous Sulfate) 325 (65 Fe) MG Tabs Take 325 mg by mouth daily.   levothyroxine 75 MCG tablet Commonly known as: SYNTHROID Take 75 mcg by mouth in the morning.   metoprolol tartrate 25 MG tablet Commonly known as: LOPRESSOR Take 0.5 tablets (12.5 mg total) by mouth 2 (two) times daily. What changed:  how much to take when to take this   montelukast 10 MG tablet Commonly known as: SINGULAIR Take 10 mg by mouth daily as needed (allergies).   nitroGLYCERIN 0.4 MG SL tablet Commonly known as: NITROSTAT Place 1 tablet (0.4 mg total) under the tongue every 5 (five) minutes as needed for chest pain.   oxyCODONE 5 MG immediate release tablet Commonly known as: Oxy IR/ROXICODONE Take 1 tablet (5 mg total) by mouth every 4 (four) hours as needed for severe pain or breakthrough pain.   potassium chloride 10 MEQ tablet Commonly known as: KLOR-CON M Take 10 mEq by mouth in the morning.   predniSONE 10 MG tablet Commonly known as: DELTASONE Take 10 mg by mouth in the morning. Continuous course. What changed: Another medication with the same name was removed. Continue taking this  medication, and follow the directions you see here.   RABEprazole 20 MG tablet Commonly known as: ACIPHEX Take 20 mg by mouth daily as needed (GERD).   rosuvastatin 20 MG tablet Commonly known as: CRESTOR Take 1 tablet (20 mg total) by mouth at bedtime. What changed: when to take this        Follow-up Information     Shirline Frees, MD Follow up in 1 week(s).   Specialty: Family Medicine Contact information: [address] [place] [address] [phone]         Cherre Robins, MD. Schedule  an appointment as soon as possible for a visit in 1 month(s).   Specialties: Vascular Surgery, Interventional Cardiology Contact information: [place] [address] [phone]         Care, [facility] Follow up.   Specialty: Home Health Services Why: [facility] will be providing home health services for PT.  They will call you in the next 24-48 hours to start services. Contact information: [address] [place]  [address] [phone]                Allergies  Allergen Reactions   Statins Other (See Comments)    Aggression  Pt ok to take crestor '20mg'$  once a week   Hydrocortisone Nausea Only   Ranexa [Ranolazine] Other (See Comments)    Severe weakness/near syncope after 1 dose Hallucinations    Hydrocodone Nausea And Vomiting   Miralax [Polyethylene Glycol] Nausea And Vomiting   Z-Pak [Azithromycin] Other (See Comments)    Raises blood sugar   Zaroxolyn [Metolazone] Other (See Comments)    Drained, no energy    You were cared for by a hospitalist during your hospital stay. If you have any questions about your discharge medications or the care you received while you were in the hospital after you are discharged, you can call the unit and asked to speak with the hospitalist on call if the hospitalist that took care of you is not available. Once you are discharged, your primary care physician will handle any further  medical issues. Please note that no refills for any discharge medications will be authorized once you are discharged, as it is imperative that you return to your primary care physician (or establish a relationship with a primary care physician if you do not have one) for your aftercare needs so that they can reassess your need for medications and monitor your lab values.   Procedures/Studies: VAS [place] ABI WITH/WO TBI  Result Date: 03/17/2022  LOWER EXTREMITY DOPPLER STUDY Patient Name:  [name]  Date of Exam:   03/17/2022 Medical Rec #: [id number]   Accession #:    [id number] Date of Birth: [birth year]   Patient Gender: M Patient Age:   77 years Exam Location:  [facility] Procedure:      VAS [place] ABI WITH/WO TBI Referring Phys: Bretta Bang GONFA --------------------------------------------------------------------------------  Indications: Decreased pulses on clinical exam, history of PAD with              non-compressible/abnormal ABIs High Risk Factors: Hypertension, Diabetes, coronary artery disease.  Limitations: Today's exam was limited due to left leg pain in the setting of              cellulitis, bandaging to right upper arm. Comparison Study: 07-04-2019 Prior ABI w/ TBI was non-compressible. Right TBI                   0.76 WNL, Left TBI 0.68 Abnormal. Performing Technologist: Darlin Coco RDMS RVT  Examination Guidelines: A complete evaluation includes at minimum, Doppler waveform signals and systolic blood pressure reading at the level of bilateral brachial, anterior tibial, and posterior tibial arteries, when vessel segments are accessible. Bilateral testing is considered an integral part of a complete examination. Photoelectric Plethysmograph (PPG) waveforms and toe systolic pressure readings are included as required and additional duplex testing as needed. Limited examinations for reoccurring indications may be performed as noted.  ABI Findings:  +---------+------------------+-----+-----------+--------+ Right    Rt Pressure (mmHg)IndexWaveform   Comment  +---------+------------------+-----+-----------+--------+  Brachial 140                    triphasic           +---------+------------------+-----+-----------+--------+ PTA      255               1.82 multiphasic         +---------+------------------+-----+-----------+--------+ DP       255               1.82 multiphasic         +---------+------------------+-----+-----------+--------+ Great Toe132               0.94 Normal              +---------+------------------+-----+-----------+--------+ +---------+------------------+-----+----------+--------------------------------+ Left     Lt Pressure (mmHg)IndexWaveform  Comment                          +---------+------------------+-----+----------+--------------------------------+ Brachial                        triphasic Bandaging- unable to obtain                                                pressure                         +---------+------------------+-----+----------+--------------------------------+ PTA                             monophasicUnable to obtain pressure                                                  secondary to pain in the setting                                           of cellulitis                    +---------+------------------+-----+----------+--------------------------------+ DP                              biphasic  Unable to obtain pressure                                                  secondary to pain in the setting                                           of cellulitis                    +---------+------------------+-----+----------+--------------------------------+ Great Toe55                0.39 Abnormal                                   +---------+------------------+-----+----------+--------------------------------+  +-------+------------+-----------+----------------+------------+  ABI/TBIToday's ABI Today's TBIPrevious ABI    Previous TBI +-------+------------+-----------+----------------+------------+ Right  [place]          0.94       [place]              0.76         +-------+------------+-----------+----------------+------------+ Left   Unobtainable0.39       1.12 (Calcified)0.68         +-------+------------+-----------+----------------+------------+ Arterial wall calcification precludes accurate ankle pressures and ABIs.  Summary: Right: Resting right ankle-brachial index indicates noncompressible right lower extremity arteries. The right toe-brachial index is normal. Left: The left toe-brachial index is abnormal. Although pressures were not obtained secondary to patient pain in the setting of cellulitis, arterial doppler waveforms at the ankle suggest some component of arterial occlusive disease. Previous examinations appeared calcified/unreliable. *See table(s) above for measurements and observations.  Electronically signed by Jamelle Haring on 03/17/2022 at 4:48:09 PM.    Final    VAS [place] LOWER EXTREMITY VENOUS (DVT)  Result Date: 03/17/2022  Lower Venous DVT Study Patient Name:  [name]  Date of Exam:   03/17/2022 Medical Rec #: [id number]   Accession #:    [id number] Date of Birth: [birth year]   Patient Gender: M Patient Age:   34 years Exam Location:  [facility] Procedure:      VAS [place] LOWER EXTREMITY VENOUS (DVT) Referring Phys: Bretta Bang GONFA --------------------------------------------------------------------------------  Indications: Pain, swelling, and erythema LT>RT, left leg cellulitis.  Comparison Study: 10-01-2021 Prior left lower extremity venous study was                   negative for DVT. Performing Technologist: Darlin Coco RDMS, RVT  Examination Guidelines: A complete evaluation includes B-mode imaging, spectral Doppler, color Doppler, and power Doppler as needed of all accessible portions  of each vessel. Bilateral testing is considered an integral part of a complete examination. Limited examinations for reoccurring indications may be performed as noted. The reflux portion of the exam is performed with the patient in reverse Trendelenburg.  +---------+---------------+---------+-----------+----------+--------------+ RIGHT    CompressibilityPhasicitySpontaneityPropertiesThrombus Aging +---------+---------------+---------+-----------+----------+--------------+ CFV      Full           Yes      Yes                                 +---------+---------------+---------+-----------+----------+--------------+ SFJ      Full                                                        +---------+---------------+---------+-----------+----------+--------------+ FV Prox  Full                                                        +---------+---------------+---------+-----------+----------+--------------+ FV Mid   Full                                                        +---------+---------------+---------+-----------+----------+--------------+  FV DistalFull                                                        +---------+---------------+---------+-----------+----------+--------------+ PFV      Full                                                        +---------+---------------+---------+-----------+----------+--------------+ POP      Full           Yes      Yes                                 +---------+---------------+---------+-----------+----------+--------------+ PTV      Full                                                        +---------+---------------+---------+-----------+----------+--------------+ PERO     Full                                                        +---------+---------------+---------+-----------+----------+--------------+ Gastroc  Full                                                         +---------+---------------+---------+-----------+----------+--------------+   +---------+---------------+---------+-----------+----------+-------------------+ LEFT     CompressibilityPhasicitySpontaneityPropertiesThrombus Aging      +---------+---------------+---------+-----------+----------+-------------------+ CFV      Full           Yes      Yes                                      +---------+---------------+---------+-----------+----------+-------------------+ SFJ      Full                                                             +---------+---------------+---------+-----------+----------+-------------------+ FV Prox  Full                                                             +---------+---------------+---------+-----------+----------+-------------------+ FV Mid   Full                                                             +---------+---------------+---------+-----------+----------+-------------------+  FV DistalFull                                                             +---------+---------------+---------+-----------+----------+-------------------+ PFV      Full                                                             +---------+---------------+---------+-----------+----------+-------------------+ POP      Full           Yes      Yes                                      +---------+---------------+---------+-----------+----------+-------------------+ PTV      Full                                                             +---------+---------------+---------+-----------+----------+-------------------+ PERO     Full                                         Not well visualized                                                       secondary to                                                              overlying                                                                 edema--patent by                                                           color               +---------+---------------+---------+-----------+----------+-------------------+ Gastroc  Full                                                             +---------+---------------+---------+-----------+----------+-------------------+  Summary: RIGHT: - There is no evidence of deep vein thrombosis in the lower extremity.  - No cystic structure found in the popliteal fossa.  LEFT: - There is no evidence of deep vein thrombosis in the lower extremity.  - No cystic structure found in the popliteal fossa.  *See table(s) above for measurements and observations. Electronically signed by Jamelle Haring on 03/17/2022 at 4:47:07 PM.    Final    ECHOCARDIOGRAM LIMITED  Result Date: 03/17/2022    ECHOCARDIOGRAM LIMITED REPORT   Patient Name:   [name] Date of Exam: 03/17/2022 Medical Rec #:  [id number]  Height:       70.0 in Accession #:    [id number] Weight:       215.0 lb Date of Birth:  1945/07/09  BSA:          2.152 m Patient Age:    57 years   BP:           133/68 mmHg Patient Gender: M          HR:           65 bpm. Exam Location:  Inpatient Procedure: Limited Echo, Limited Color Doppler and Color Doppler Indications:    other abnormalities of the heart  History:        Patient has prior history of Echocardiogram examinations, most                 recent 10/21/2021. CAD, chronic kidney disease. Sarcoidosis; Risk                 Factors:Sleep Apnea, Diabetes, Dyslipidemia and Hypertension.  Sonographer:    [place] Referring Phys: [id number] TAYE T GONFA IMPRESSIONS  1. Diffiicult acoustic windows, endocardium is difficult to see Lateral wall appears mildly hypokinetic. Would consider limited echo with Definity to help delineate. . Left ventricular ejection fraction, by estimation, is 50 to 55%. The left ventricle has low normal function. Left ventricular diastolic parameters are consistent with Grade I diastolic dysfunction  (impaired relaxation).  2. Right ventricular systolic function is low normal. The right ventricular size is normal.  3. Trivial mitral valve regurgitation.  4. The inferior vena cava is dilated in size with <50% respiratory variability, suggesting right atrial pressure of 15 mmHg. FINDINGS  Left Ventricle: Diffiicult acoustic windows, endocardium is difficult to see Lateral wall appears mildly hypokinetic. Would consider limited echo with Definity to help delineate. Left ventricular ejection fraction, by estimation, is 50 to 55%. The left ventricle has low normal function. The left ventricular internal cavity size was normal in size. There is no left ventricular hypertrophy. Left ventricular diastolic parameters are consistent with Grade I diastolic dysfunction (impaired relaxation). Right Ventricle: The right ventricular size is normal. Right ventricular systolic function is low normal. Pericardium: There is no evidence of pericardial effusion. Mitral Valve: There is mild thickening of the mitral valve leaflet(s). Mild mitral annular calcification. Trivial mitral valve regurgitation. Tricuspid Valve: The tricuspid valve is normal in structure. Tricuspid valve regurgitation is trivial. Pulmonic Valve: The pulmonic valve was normal in structure. Pulmonic valve regurgitation is mild. Venous: The inferior vena cava is dilated in size with less than 50% respiratory variability, suggesting right atrial pressure of 15 mmHg. Additional Comments: Spectral Doppler performed. Color Doppler performed.  LEFT VENTRICLE PLAX 2D LVIDd:         5.30 cm      Diastology LVIDs:         3.90 cm  LV e' medial:    4.57 cm/s LV PW:         1.00 cm      LV E/e' medial:  20.7 LV IVS:        0.90 cm      LV e' lateral:   5.77 cm/s LVOT diam:     2.00 cm      LV E/e' lateral: 16.4 LV SV:         58 LV SV Index:   27 LVOT Area:     3.14 cm  LV Volumes (MOD) LV vol d, MOD A4C: 109.0 ml LV vol s, MOD A4C: 60.4 ml LV SV MOD A4C:     109.0 ml  IVC IVC diam: 2.00 cm LEFT ATRIUM         Index LA diam:    3.80 cm 1.77 cm/m  AORTIC VALVE LVOT Vmax:   86.20 cm/s LVOT Vmean:  57.300 cm/s LVOT VTI:    0.186 m  AORTA Ao Asc diam: 3.00 cm MITRAL VALVE MV Area (PHT): 4.24 cm    SHUNTS MV Decel Time: 179 msec    Systemic VTI:  0.19 m MV E velocity: 94.50 cm/s  Systemic Diam: 2.00 cm MV A velocity: 91.90 cm/s MV E/A ratio:  1.03 Dorris Carnes MD Electronically signed by Dorris Carnes MD Signature Date/Time: 03/17/2022/9:09:39 AM    Final    DG Foot 2 Views Left  Result Date: 03/16/2022 CLINICAL DATA:  Swelling EXAM: LEFT FOOT - 2 VIEW COMPARISON:  12/24/2019 FINDINGS: Chronic changes of Charcot arthropathy of the midfoot with midfoot collapse. Progressive fusion of the cuboid and cuneiforms. Prior first MTP joint arthrodesis with intact hardware. Similar subluxation of the second toe DIP joint. No evidence of an acute fracture or traumatic dislocation. No definite sites of erosion or aggressive periosteal elevation. Diffuse osseous demineralization. Extensive soft tissue swelling of the lower leg and forefoot. Prominent atherosclerotic vascular calcifications. No soft tissue gas. IMPRESSION: 1. Chronic changes of Charcot arthropathy of the midfoot with midfoot collapse. 2. Extensive soft tissue swelling of the lower leg and forefoot. No soft tissue gas. 3. No definite radiographic evidence of osteomyelitis. Electronically Signed   By: Davina Poke D.O.   On: 03/16/2022 13:45   DG Tibia/Fibula Left  Result Date: 03/16/2022 CLINICAL DATA:  Pain and swelling EXAM: LEFT TIBIA AND FIBULA - 2 VIEW COMPARISON:  12/24/2019 FINDINGS: There is no evidence of fracture or other focal bone lesions. No subcutaneous gas. Extensive arterial calcifications as before. IMPRESSION: No acute findings. Extensive arterial calcifications. Electronically Signed   By: Lucrezia Europe M.D.   On: 03/16/2022 13:36   CT CHEST ABDOMEN PELVIS W CONTRAST  Result Date: 03/15/2022 CLINICAL DATA:   Sepsis.  Emesis.  Weakness. EXAM: CT CHEST, ABDOMEN, AND PELVIS WITH CONTRAST TECHNIQUE: Multidetector CT imaging of the chest, abdomen and pelvis was performed following the standard protocol during bolus administration of intravenous contrast. RADIATION DOSE REDUCTION: This exam was performed according to the departmental dose-optimization program which includes automated exposure control, adjustment of the mA and/or kV according to patient size and/or use of iterative reconstruction technique. CONTRAST:  43m OMNIPAQUE IOHEXOL 350 MG/ML SOLN COMPARISON:  CT scans from 10/19/2021 and 10/20/2021 FINDINGS: CT CHEST FINDINGS Cardiovascular: Coronary, aortic [name], and branch vessel atherosclerotic vascular disease. Mild cardiomegaly. Mediastinum/Nodes: Small calcified lymph nodes compatible with old granulomatous disease. Bilateral gynecomastia Lungs/Pleura: Trace right pleural effusion. Airway thickening is present, suggesting bronchitis or reactive airways disease. Medial right lower  lobe subpleural nodule 7 by 4 mm on image 82 series 5, previously the same on 05/06/2019, considered benign. Mild atelectasis in the posterior basal segments of the lower lobes, right greater than left. The 4 mm subpleural nodule in the right upper lobe on image 77 of series 5, no change from 05/06/2019, considered benign. Mild scarring in the right middle lobe. There is some faint right upper lobe perihilar ground-glass density on image 72 series 5, not present on 10/20/2021, probably from mild alveolitis. Musculoskeletal: Old healed right lower rib fracture. CT ABDOMEN PELVIS FINDINGS Hepatobiliary: Cholecystectomy.  Otherwise unremarkable. Pancreas: Unremarkable Spleen: Unremarkable Adrenals/Urinary Tract: No significant findings. Stomach/Bowel: Unremarkable Vascular/Lymphatic: Atherosclerosis is present, including aortoiliac atherosclerotic disease. Atheromatous plaque at the origin of the celiac trunk and SMA, without occlusion.  Reproductive: Borderline prostatomegaly. Other: No supplemental non-categorized findings. Musculoskeletal: Fatty left spermatic cord. Posterolateral rod and pedicle screw fixation at L4-5 bilaterally. IMPRESSION: 1. A specific cause for the patient's sepsis is not identified. 2. Faint right upper lobe perihilar ground-glass density, probably from mild alveolitis. 3. Airway thickening is present, suggesting bronchitis or reactive airways disease. 4. Trace right pleural effusion. 5. Old healed right lower rib fracture. 6. Old granulomatous disease. 7. Mild cardiomegaly. 8. Atheromatous plaque at the origin of the celiac trunk and SMA, without occlusion. 9. Borderline prostatomegaly. 10. Fatty left spermatic cord. 11. Aortic and coronary atherosclerosis. Aortic Atherosclerosis (ICD10-I70.0). Electronically Signed   By: Van Clines M.D.   On: 03/15/2022 15:48   DG Chest Port 1 View  Result Date: 03/15/2022 CLINICAL DATA:  One-month history of with acute onset emesis and left-sided chest pain EXAM: PORTABLE CHEST 1 VIEW COMPARISON:  Chest radiograph dated 02/04/2022 FINDINGS: Normal lung volumes. Bibasilar patchy opacities. No pleural effusion or pneumothorax. Similar enlarged cardiomediastinal silhouette. The visualized skeletal structures are unremarkable. Right upper quadrant surgical clips. IMPRESSION: Bibasilar patchy opacities, which may represent atelectasis, aspiration, or pneumonia. Electronically Signed   By: Darrin Nipper M.D.   On: 03/15/2022 12:40   SLEEP STUDY DOCUMENTS  Result Date: 02/22/2022 Ordered by an unspecified provider.    The results of significant diagnostics from this hospitalization (including imaging, microbiology, ancillary and laboratory) are listed below for reference.     Microbiology: Recent Results (from the past 240 hour(s))  Resp panel by RT-PCR (RSV, Flu A&B, Covid) Anterior Nasal Swab     Status: None   Collection Time: 03/15/22 11:33 AM   Specimen: Anterior  Nasal Swab  Result Value Ref Range Status   SARS Coronavirus 2 by RT PCR NEGATIVE NEGATIVE Final    Comment: (NOTE) SARS-CoV-2 target nucleic acids are NOT DETECTED.  The SARS-CoV-2 RNA is generally detectable in upper respiratory specimens during the acute phase of infection. The lowest concentration of SARS-CoV-2 viral copies this assay can detect is 138 copies/mL. A negative result does not preclude SARS-Cov-2 infection and should not be used as the sole basis for treatment or other patient management decisions. A negative result may occur with  improper specimen collection/handling, submission of specimen other than nasopharyngeal swab, presence of viral mutation(s) within the areas targeted by this assay, and inadequate number of viral copies(<138 copies/mL). A negative result must be combined with clinical observations, patient history, and epidemiological information. The expected result is Negative.  Fact Sheet for Patients:  EntrepreneurPulse.com.au  Fact Sheet for Healthcare Providers:  IncredibleEmployment.be  This test is no t yet approved or cleared by the [place] FDA and  has been authorized for detection and/or diagnosis  of SARS-CoV-2 by FDA under an Emergency Use Authorization (EUA). This EUA will remain  in effect (meaning this test can be used) for the duration of the COVID-19 declaration under Section 564(b)(1) of the Act, 21 U.S.C.section 360bbb-3(b)(1), unless the authorization is terminated  or revoked sooner.       Influenza A by PCR NEGATIVE NEGATIVE Final   Influenza B by PCR NEGATIVE NEGATIVE Final    Comment: (NOTE) The Xpert Xpress SARS-CoV-2/FLU/RSV plus assay is intended as an aid in the diagnosis of influenza from Nasopharyngeal swab specimens and should not be used as a sole basis for treatment. Nasal washings and aspirates are unacceptable for Xpert Xpress SARS-CoV-2/FLU/RSV testing.  Fact Sheet for  Patients: EntrepreneurPulse.com.au  Fact Sheet for Healthcare Providers: IncredibleEmployment.be  This test is not yet approved or cleared by the [place] FDA and has been authorized for detection and/or diagnosis of SARS-CoV-2 by FDA under an Emergency Use Authorization (EUA). This EUA will remain in effect (meaning this test can be used) for the duration of the COVID-19 declaration under Section 564(b)(1) of the Act, 21 U.S.C. section 360bbb-3(b)(1), unless the authorization is terminated or revoked.     Resp Syncytial Virus by PCR NEGATIVE NEGATIVE Final    Comment: (NOTE) Fact Sheet for Patients: EntrepreneurPulse.com.au  Fact Sheet for Healthcare Providers: IncredibleEmployment.be  This test is not yet approved or cleared by the [place] FDA and has been authorized for detection and/or diagnosis of SARS-CoV-2 by FDA under an Emergency Use Authorization (EUA). This EUA will remain in effect (meaning this test can be used) for the duration of the COVID-19 declaration under Section 564(b)(1) of the Act, 21 U.S.C. section 360bbb-3(b)(1), unless the authorization is terminated or revoked.  Performed at [facility], [place] [address] [place], [place]  [address]   Urine Culture     Status: Abnormal   Collection Time: 03/15/22 11:33 AM   Specimen: In/Out Cath Urine  Result Value Ref Range Status   Specimen Description IN/OUT CATH URINE  Final   Special Requests   Final    NONE Performed at [facility], [place] [address], [place], [place] [address]    Culture MULTIPLE SPECIES PRESENT, SUGGEST RECOLLECTION (A)  Final   Report Status 03/16/2022 FINAL  Final  Respiratory (~20 pathogens) panel by PCR     Status: None   Collection Time: 03/15/22 11:33 AM   Specimen: Nasopharyngeal Swab; Respiratory  Result Value Ref Range Status   Adenovirus NOT DETECTED NOT DETECTED Final   Coronavirus  229E NOT DETECTED NOT DETECTED Final    Comment: (NOTE) The Coronavirus on the Respiratory Panel, DOES NOT test for the novel  Coronavirus (2019 nCoV)    Coronavirus HKU1 NOT DETECTED NOT DETECTED Final   Coronavirus NL63 NOT DETECTED NOT DETECTED Final   Coronavirus OC43 NOT DETECTED NOT DETECTED Final   Metapneumovirus NOT DETECTED NOT DETECTED Final   Rhinovirus / Enterovirus NOT DETECTED NOT DETECTED Final   Influenza A NOT DETECTED NOT DETECTED Final   Influenza B NOT DETECTED NOT DETECTED Final   Parainfluenza Virus 1 NOT DETECTED NOT DETECTED Final   Parainfluenza Virus 2 NOT DETECTED NOT DETECTED Final   Parainfluenza Virus 3 NOT DETECTED NOT DETECTED Final   Parainfluenza Virus 4 NOT DETECTED NOT DETECTED Final   Respiratory Syncytial Virus NOT DETECTED NOT DETECTED Final   Bordetella pertussis NOT DETECTED NOT DETECTED Final   Bordetella Parapertussis NOT DETECTED NOT DETECTED Final   Chlamydophila pneumoniae NOT  DETECTED NOT DETECTED Final   Mycoplasma pneumoniae NOT DETECTED NOT DETECTED Final    Comment: Performed at [facility], [place] [address], [place], [place] [address]  Blood Culture (routine x 2)     Status: None   Collection Time: 03/15/22 12:25 PM   Specimen: BLOOD LEFT FOREARM  Result Value Ref Range Status   Specimen Description BLOOD LEFT FOREARM  Final   Special Requests   Final    BOTTLES DRAWN AEROBIC AND ANAEROBIC Blood Culture results may not be optimal due to an inadequate volume of blood received in culture bottles   Culture   Final    NO GROWTH 5 DAYS Performed at [facility], [place] [address], [place], [place] [address]    Report Status 03/20/2022 FINAL  Final  Blood Culture (routine x 2)     Status: None   Collection Time: 03/15/22 12:25 PM   Specimen: BLOOD RIGHT FOREARM  Result Value Ref Range Status   Specimen Description BLOOD RIGHT FOREARM  Final   Special Requests   Final    BOTTLES DRAWN AEROBIC AND ANAEROBIC Blood Culture  results may not be optimal due to an inadequate volume of blood received in culture bottles   Culture   Final    NO GROWTH 5 DAYS Performed at [facility], [place] [address] [place], [place] [address]    Report Status 03/20/2022 FINAL  Final  MRSA Next Gen by PCR, Nasal     Status: None   Collection Time: 03/17/22  7:17 AM   Specimen: Nasal Mucosa; Nasal Swab  Result Value Ref Range Status   MRSA by PCR Next Gen NOT DETECTED NOT DETECTED Final    Comment: (NOTE) The GeneXpert MRSA Assay (FDA approved for NASAL specimens only), is one component of a comprehensive MRSA colonization surveillance program. It is not intended to diagnose MRSA infection nor to guide or monitor treatment for MRSA infections. Test performance is not FDA approved in patients less than 89 years old. Performed at [facility], [place] [address], [place], [place] [address]      Labs: BNP (last 3 results) Recent Labs    03/16/22 0458 03/18/22 0415 03/20/22 0250  BNP 1,096.7* 337.5* 646.8*   Basic Metabolic Panel: Recent Labs  Lab 03/17/22 0317 03/18/22 0412 03/19/22 0232 03/20/22 0250 03/21/22 0454  NA 133* 135 136 138 138  K 4.1 3.2* 3.4* 3.3* 3.9  CL 99 99 100 102 99  CO2 '24 25 26 28 26  '$ GLUCOSE 297* 198* 221* 149* 99  BUN 37* 30* '22 16 16  '$ CREATININE 1.70* 1.58* 1.79* 1.63* 1.60*  CALCIUM 8.1* 8.2* 7.8* 7.9* 8.3*  MG 2.2 2.1 1.9 1.8 1.8  PHOS 2.7 2.1*  --   --   --    Liver Function Tests: Recent Labs  Lab 03/15/22 1225 03/16/22 0458 03/17/22 0317 03/18/22 0412  AST 48* 46* 32  --   ALT 38 38 31  --   ALKPHOS 60 54 63  --   BILITOT 1.0 0.7 0.7  --   PROT 6.7 5.5* 5.7*  --   ALBUMIN 3.1* 2.4* 2.2* 2.2*   No results for input(s): "LIPASE", "AMYLASE" in the last 168 hours. No results for input(s): "AMMONIA" in the last 168 hours. CBC: Recent Labs  Lab 03/15/22 1225 03/16/22 0458 03/17/22 0317 03/18/22 0412 03/19/22 0232 03/20/22 0250 03/21/22 0454  WBC 34.3*   < >  26.6* 14.9* 13.1* 13.7* 15.6*  NEUTROABS 30.8*  --  24.7*  --   --   --   --   HGB 14.0   < > 10.9* 11.4* 11.0* 11.1* 13.1  HCT 45.8   < > 35.0* 37.5* 35.0* 35.8* 42.4  MCV 91.6   < > 91.1 90.8 89.5 90.9 91.0  PLT 305   < > 208 220 226 238 235   < > = values in this interval not displayed.   Cardiac Enzymes: Recent Labs  Lab 03/16/22 0458 03/17/22 0317  CKTOTAL 151 69   BNP: Invalid input(s): "POCBNP" CBG: Recent Labs  Lab 03/20/22 0725 03/20/22 1315 03/20/22 1701 03/20/22 1928 03/21/22 0736  GLUCAP 125* 312* 235* 208* 109*   D-Dimer No results for input(s): "DDIMER" in the last 72 hours. Hgb A1c No results for input(s): "HGBA1C" in the last 72 hours. Lipid Profile No results for input(s): "CHOL", "HDL", "LDLCALC", "TRIG", "CHOLHDL", "LDLDIRECT" in the last 72 hours. Thyroid function studies No results for input(s): "TSH", "T4TOTAL", "T3FREE", "THYROIDAB" in the last 72 hours.  Invalid input(s): "FREET3" Anemia work up No results for input(s): "VITAMINB12", "FOLATE", "FERRITIN", "TIBC", "IRON", "RETICCTPCT" in the last 72 hours. Urinalysis    Component Value Date/Time   COLORURINE YELLOW 03/15/2022 1440   APPEARANCEUR CLEAR 03/15/2022 1440   LABSPEC 1.014 03/15/2022 1440   PHURINE 5.0 03/15/2022 1440   GLUCOSEU NEGATIVE 03/15/2022 1440   HGBUR MODERATE (A) 03/15/2022 1440   BILIRUBINUR NEGATIVE 03/15/2022 1440   KETONESUR NEGATIVE 03/15/2022 1440   PROTEINUR 100 (A) 03/15/2022 1440   UROBILINOGEN 0.2 11/09/2010 2146   NITRITE NEGATIVE 03/15/2022 1440   LEUKOCYTESUR NEGATIVE 03/15/2022 1440   Sepsis Labs Recent Labs  Lab 03/18/22 0412 03/19/22 0232 03/20/22 0250 03/21/22 0454  WBC 14.9* 13.1* 13.7* 15.6*   Microbiology Recent Results (from the past 240 hour(s))  Resp panel by RT-PCR (RSV, Flu A&B, Covid) Anterior Nasal Swab     Status: None   Collection Time: 03/15/22 11:33 AM   Specimen: Anterior Nasal Swab  Result Value Ref Range Status   SARS  Coronavirus 2 by RT PCR NEGATIVE NEGATIVE Final    Comment: (NOTE) SARS-CoV-2 target nucleic acids are NOT DETECTED.  The SARS-CoV-2 RNA is generally detectable in upper respiratory specimens during the acute phase of infection. The lowest concentration of SARS-CoV-2 viral copies this assay can detect is 138 copies/mL. A negative result does not preclude SARS-Cov-2 infection and should not be used as the sole basis for treatment or other patient management decisions. A negative result may occur with  improper specimen collection/handling, submission of specimen other than nasopharyngeal swab, presence of viral mutation(s) within the areas targeted by this assay, and inadequate number of viral copies(<138 copies/mL). A negative result must be combined with clinical observations, patient history, and epidemiological information. The expected result is Negative.  Fact Sheet for Patients:  EntrepreneurPulse.com.au  Fact Sheet for Healthcare Providers:  IncredibleEmployment.be  This test is no t yet approved or cleared by the [place] FDA and  has been authorized for detection and/or diagnosis of SARS-CoV-2 by FDA under an Emergency Use Authorization (EUA). This EUA will remain  in effect (meaning this test can be used) for the duration of the COVID-19 declaration under Section 564(b)(1) of the Act, 21 U.S.C.section 360bbb-3(b)(1), unless the authorization is terminated  or revoked sooner.       Influenza A by PCR NEGATIVE NEGATIVE Final   Influenza B by PCR NEGATIVE NEGATIVE Final    Comment: (NOTE) The Xpert  Xpress SARS-CoV-2/FLU/RSV plus assay is intended as an aid in the diagnosis of influenza from Nasopharyngeal swab specimens and should not be used as a sole basis for treatment. Nasal washings and aspirates are unacceptable for Xpert Xpress SARS-CoV-2/FLU/RSV testing.  Fact Sheet for  Patients: EntrepreneurPulse.com.au  Fact Sheet for Healthcare Providers: IncredibleEmployment.be  This test is not yet approved or cleared by the [place] FDA and has been authorized for detection and/or diagnosis of SARS-CoV-2 by FDA under an Emergency Use Authorization (EUA). This EUA will remain in effect (meaning this test can be used) for the duration of the COVID-19 declaration under Section 564(b)(1) of the Act, 21 U.S.C. section 360bbb-3(b)(1), unless the authorization is terminated or revoked.     Resp Syncytial Virus by PCR NEGATIVE NEGATIVE Final    Comment: (NOTE) Fact Sheet for Patients: EntrepreneurPulse.com.au  Fact Sheet for Healthcare Providers: IncredibleEmployment.be  This test is not yet approved or cleared by the [place] FDA and has been authorized for detection and/or diagnosis of SARS-CoV-2 by FDA under an Emergency Use Authorization (EUA). This EUA will remain in effect (meaning this test can be used) for the duration of the COVID-19 declaration under Section 564(b)(1) of the Act, 21 U.S.C. section 360bbb-3(b)(1), unless the authorization is terminated or revoked.  Performed at [facility], [place] [address] [place], [place] [address]   Urine Culture     Status: Abnormal   Collection Time: 03/15/22 11:33 AM   Specimen: In/Out Cath Urine  Result Value Ref Range Status   Specimen Description IN/OUT CATH URINE  Final   Special Requests   Final    NONE Performed at [facility], [place] [address], [place], [place] [address]    Culture MULTIPLE SPECIES PRESENT, SUGGEST RECOLLECTION (A)  Final   Report Status 03/16/2022 FINAL  Final  Respiratory (~20 pathogens) panel by PCR     Status: None   Collection Time: 03/15/22 11:33 AM   Specimen: Nasopharyngeal Swab; Respiratory  Result Value Ref Range Status   Adenovirus NOT DETECTED NOT DETECTED Final   Coronavirus  229E NOT DETECTED NOT DETECTED Final    Comment: (NOTE) The Coronavirus on the Respiratory Panel, DOES NOT test for the novel  Coronavirus (2019 nCoV)    Coronavirus HKU1 NOT DETECTED NOT DETECTED Final   Coronavirus NL63 NOT DETECTED NOT DETECTED Final   Coronavirus OC43 NOT DETECTED NOT DETECTED Final   Metapneumovirus NOT DETECTED NOT DETECTED Final   Rhinovirus / Enterovirus NOT DETECTED NOT DETECTED Final   Influenza A NOT DETECTED NOT DETECTED Final   Influenza B NOT DETECTED NOT DETECTED Final   Parainfluenza Virus 1 NOT DETECTED NOT DETECTED Final   Parainfluenza Virus 2 NOT DETECTED NOT DETECTED Final   Parainfluenza Virus 3 NOT DETECTED NOT DETECTED Final   Parainfluenza Virus 4 NOT DETECTED NOT DETECTED Final   Respiratory Syncytial Virus NOT DETECTED NOT DETECTED Final   Bordetella pertussis NOT DETECTED NOT DETECTED Final   Bordetella Parapertussis NOT DETECTED NOT DETECTED Final   Chlamydophila pneumoniae NOT DETECTED NOT DETECTED Final   Mycoplasma pneumoniae NOT DETECTED NOT DETECTED Final    Comment: Performed at [facility], [place]. [address], [place], [place] [address]  Blood Culture (routine x 2)     Status: None   Collection Time: 03/15/22 12:25 PM   Specimen: BLOOD LEFT FOREARM  Result Value Ref Range Status   Specimen Description BLOOD LEFT FOREARM  Final   Special Requests   Final  BOTTLES DRAWN AEROBIC AND ANAEROBIC Blood Culture results may not be optimal due to an inadequate volume of blood received in culture bottles   Culture   Final    NO GROWTH 5 DAYS Performed at [facility], [place] [address], [place], [place] [address]    Report Status 03/20/2022 FINAL  Final  Blood Culture (routine x 2)     Status: None   Collection Time: 03/15/22 12:25 PM   Specimen: BLOOD RIGHT FOREARM  Result Value Ref Range Status   Specimen Description BLOOD RIGHT FOREARM  Final   Special Requests   Final    BOTTLES DRAWN AEROBIC AND ANAEROBIC Blood Culture  results may not be optimal due to an inadequate volume of blood received in culture bottles   Culture   Final    NO GROWTH 5 DAYS Performed at [facility], [place] [address], [place], [place] [address]    Report Status 03/20/2022 FINAL  Final  MRSA Next Gen by PCR, Nasal     Status: None   Collection Time: 03/17/22  7:17 AM   Specimen: Nasal Mucosa; Nasal Swab  Result Value Ref Range Status   MRSA by PCR Next Gen NOT DETECTED NOT DETECTED Final    Comment: (NOTE) The GeneXpert MRSA Assay (FDA approved for NASAL specimens only), is one component of a comprehensive MRSA colonization surveillance program. It is not intended to diagnose MRSA infection nor to guide or monitor treatment for MRSA infections. Test performance is not FDA approved in patients less than 30 years old. Performed at [facility], [place] [address] [place], [place] [address]      Time coordinating discharge:  I have spent 35 minutes face to face with the patient and on the ward discussing the patients care, assessment, plan and disposition with other care givers. >50% of the time was devoted counseling the patient about the risks and benefits of treatment/Discharge disposition and coordinating care.   SIGNED:   Damita Lack, MD  [facility] 03/21/2022, 11:02 AM   If 7PM-7AM, please contact night-coverage

## 2022-03-21 NOTE — TOC Transition Note (Signed)
TOC discharge meds left with nurse secretary at nursing station.

## 2022-03-21 NOTE — Progress Notes (Signed)
Occupational Therapy Treatment Patient Details Name: [name] MRN: [id number] DOB: 07-21-1945 Today's Date: 03/21/2022   History of present illness Pt is 77 yo male admitted 03/15/22 with sepsis due to L LE cellulitis. Pt with hx including CAD, CHF, OSA, adrenal insufficiency, sarcoidosis, DM, CKD, PAD, HTN, hypothyroidism, anemia, obesity, and GERD   OT comments  Patient with good progress, and besides continued L foot cellulitis and discomfort, he is very close to his baseline.  No further OT needs exist in the acute setting.  His spouse can provide the needed assist at home.  No post acute OT is anticipated.    Recommendations for follow up therapy are one component of a multi-disciplinary discharge planning process, led by the attending physician.  Recommendations may be updated based on patient status, additional functional criteria and insurance authorization.    Follow Up Recommendations  No OT follow up     Assistance Recommended at Discharge Set up Supervision/Assistance  Patient can return home with the following  A little help with bathing/dressing/bathroom;Assist for transportation;Assistance with cooking/housework   Equipment Recommendations  None recommended by OT    Recommendations for Other Services      Precautions / Restrictions Precautions Precautions: Fall Restrictions Weight Bearing Restrictions: No       Mobility Bed Mobility Overal bed mobility: Modified Independent                  Transfers Overall transfer level: Needs assistance Equipment used: Rolling walker (2 wheels) Transfers: Sit to/from Stand Sit to Stand: Modified independent (Device/Increase time)     Step pivot transfers: Modified independent (Device/Increase time)           Balance Overall balance assessment: Needs assistance Sitting-balance support: Feet supported Sitting balance-Leahy Scale: Good     Standing balance support: Reliant on assistive device for  balance Standing balance-Leahy Scale: Fair                             ADL either performed or assessed with clinical judgement   ADL       Grooming: Wash/dry hands;Standing;Modified independent           Upper Body Dressing : Set up;Sitting   Lower Body Dressing: Min guard;Sit to/from stand;Minimal assistance   Toilet Transfer: Regular Toilet;Rolling walker (2 wheels);Modified Independent                  Extremity/Trunk Assessment Upper Extremity Assessment Upper Extremity Assessment: Overall WFL for tasks assessed   Lower Extremity Assessment Lower Extremity Assessment: Defer to PT evaluation   Cervical / Trunk Assessment Cervical / Trunk Assessment: Normal    Vision Baseline Vision/History: 1 Wears glasses Patient Visual Report: No change from baseline     Perception Perception Perception: Within Functional Limits   Praxis Praxis Praxis: Intact    Cognition Arousal/Alertness: Awake/alert Behavior During Therapy: WFL for tasks assessed/performed Overall Cognitive Status: Within Functional Limits for tasks assessed                                                       General Comments  Increased SOB this date with mobility    Pertinent Vitals/ Pain       Pain Assessment Pain Assessment: Faces Faces Pain Scale: Hurts a little  bit Pain Location: L foot with weight bearing Pain Descriptors / Indicators: Tender Pain Intervention(s): Monitored during session                                                          Frequency           Progress Toward Goals  OT Goals(current goals can now be found in the care plan section)  Progress towards OT goals: Goals met/education completed, patient discharged from OT  Acute Rehab OT Goals OT Goal Formulation: All assessment and education complete, DC therapy Time For Goal Achievement: 03/31/22 Potential to Achieve Goals: Good  Plan All  goals met and education completed, patient discharged from OT services    Co-evaluation                 AM-PAC OT "6 Clicks" Daily Activity     Outcome Measure   Help from another person eating meals?: None Help from another person taking care of personal grooming?: None Help from another person toileting, which includes using toliet, bedpan, or urinal?: None Help from another person bathing (including washing, rinsing, drying)?: A Little Help from another person to put on and taking off regular upper body clothing?: None Help from another person to put on and taking off regular lower body clothing?: A Little 6 Click Score: 22    End of Session Equipment Utilized During Treatment: Rolling walker (2 wheels)  OT Visit Diagnosis: Pain Pain - Right/Left: Left Pain - part of body: Ankle and joints of foot   Activity Tolerance Patient tolerated treatment well   Patient Left in chair;with call bell/phone within reach;with family/visitor present   Nurse Communication Mobility status        Time: 0810-0830 OT Time Calculation (min): 20 min  Charges: OT General Charges $OT Visit: 1 Visit OT Treatments $Self Care/Home Management : 8-22 mins  03/21/2022  RP, OTR/L  Acute Rehabilitation Services  Office:  [phone]   Metta Clines 03/21/2022, 8:37 AM

## 2022-03-24 DIAGNOSIS — J181 Lobar pneumonia, unspecified organism: Secondary | ICD-10-CM | POA: Diagnosis not present

## 2022-03-24 DIAGNOSIS — I130 Hypertensive heart and chronic kidney disease with heart failure and stage 1 through stage 4 chronic kidney disease, or unspecified chronic kidney disease: Secondary | ICD-10-CM | POA: Diagnosis not present

## 2022-03-24 DIAGNOSIS — E1122 Type 2 diabetes mellitus with diabetic chronic kidney disease: Secondary | ICD-10-CM | POA: Diagnosis not present

## 2022-03-24 DIAGNOSIS — I2510 Atherosclerotic heart disease of native coronary artery without angina pectoris: Secondary | ICD-10-CM | POA: Diagnosis not present

## 2022-03-24 DIAGNOSIS — N1831 Chronic kidney disease, stage 3a: Secondary | ICD-10-CM | POA: Diagnosis not present

## 2022-03-24 DIAGNOSIS — L03116 Cellulitis of left lower limb: Secondary | ICD-10-CM | POA: Diagnosis not present

## 2022-03-24 DIAGNOSIS — I5033 Acute on chronic diastolic (congestive) heart failure: Secondary | ICD-10-CM | POA: Diagnosis not present

## 2022-03-24 DIAGNOSIS — D869 Sarcoidosis, unspecified: Secondary | ICD-10-CM | POA: Diagnosis not present

## 2022-03-24 DIAGNOSIS — D631 Anemia in chronic kidney disease: Secondary | ICD-10-CM | POA: Diagnosis not present

## 2022-03-28 DIAGNOSIS — I2510 Atherosclerotic heart disease of native coronary artery without angina pectoris: Secondary | ICD-10-CM | POA: Diagnosis not present

## 2022-03-28 DIAGNOSIS — I5033 Acute on chronic diastolic (congestive) heart failure: Secondary | ICD-10-CM | POA: Diagnosis not present

## 2022-03-28 DIAGNOSIS — E1122 Type 2 diabetes mellitus with diabetic chronic kidney disease: Secondary | ICD-10-CM | POA: Diagnosis not present

## 2022-03-28 DIAGNOSIS — D869 Sarcoidosis, unspecified: Secondary | ICD-10-CM | POA: Diagnosis not present

## 2022-03-28 DIAGNOSIS — N1831 Chronic kidney disease, stage 3a: Secondary | ICD-10-CM | POA: Diagnosis not present

## 2022-03-28 DIAGNOSIS — L03116 Cellulitis of left lower limb: Secondary | ICD-10-CM | POA: Diagnosis not present

## 2022-03-28 DIAGNOSIS — D631 Anemia in chronic kidney disease: Secondary | ICD-10-CM | POA: Diagnosis not present

## 2022-03-28 DIAGNOSIS — J181 Lobar pneumonia, unspecified organism: Secondary | ICD-10-CM | POA: Diagnosis not present

## 2022-03-28 DIAGNOSIS — I130 Hypertensive heart and chronic kidney disease with heart failure and stage 1 through stage 4 chronic kidney disease, or unspecified chronic kidney disease: Secondary | ICD-10-CM | POA: Diagnosis not present

## 2022-03-29 DIAGNOSIS — Z09 Encounter for follow-up examination after completed treatment for conditions other than malignant neoplasm: Secondary | ICD-10-CM | POA: Diagnosis not present

## 2022-03-29 DIAGNOSIS — I5032 Chronic diastolic (congestive) heart failure: Secondary | ICD-10-CM | POA: Diagnosis not present

## 2022-03-29 DIAGNOSIS — I739 Peripheral vascular disease, unspecified: Secondary | ICD-10-CM | POA: Diagnosis not present

## 2022-03-29 DIAGNOSIS — D849 Immunodeficiency, unspecified: Secondary | ICD-10-CM | POA: Diagnosis not present

## 2022-03-29 DIAGNOSIS — N179 Acute kidney failure, unspecified: Secondary | ICD-10-CM | POA: Diagnosis not present

## 2022-03-29 DIAGNOSIS — I700 Atherosclerosis of aorta: Secondary | ICD-10-CM | POA: Diagnosis not present

## 2022-03-29 DIAGNOSIS — L03116 Cellulitis of left lower limb: Secondary | ICD-10-CM | POA: Diagnosis not present

## 2022-03-30 DIAGNOSIS — E11610 Type 2 diabetes mellitus with diabetic neuropathic arthropathy: Secondary | ICD-10-CM | POA: Diagnosis not present

## 2022-03-30 DIAGNOSIS — Z8631 Personal history of diabetic foot ulcer: Secondary | ICD-10-CM | POA: Diagnosis not present

## 2022-03-30 DIAGNOSIS — R600 Localized edema: Secondary | ICD-10-CM | POA: Diagnosis not present

## 2022-03-30 DIAGNOSIS — E1143 Type 2 diabetes mellitus with diabetic autonomic (poly)neuropathy: Secondary | ICD-10-CM | POA: Diagnosis not present

## 2022-03-30 DIAGNOSIS — L03116 Cellulitis of left lower limb: Secondary | ICD-10-CM | POA: Diagnosis not present

## 2022-03-31 DIAGNOSIS — I2510 Atherosclerotic heart disease of native coronary artery without angina pectoris: Secondary | ICD-10-CM | POA: Diagnosis not present

## 2022-03-31 DIAGNOSIS — J181 Lobar pneumonia, unspecified organism: Secondary | ICD-10-CM | POA: Diagnosis not present

## 2022-03-31 DIAGNOSIS — I130 Hypertensive heart and chronic kidney disease with heart failure and stage 1 through stage 4 chronic kidney disease, or unspecified chronic kidney disease: Secondary | ICD-10-CM | POA: Diagnosis not present

## 2022-03-31 DIAGNOSIS — D631 Anemia in chronic kidney disease: Secondary | ICD-10-CM | POA: Diagnosis not present

## 2022-03-31 DIAGNOSIS — N1831 Chronic kidney disease, stage 3a: Secondary | ICD-10-CM | POA: Diagnosis not present

## 2022-03-31 DIAGNOSIS — D869 Sarcoidosis, unspecified: Secondary | ICD-10-CM | POA: Diagnosis not present

## 2022-03-31 DIAGNOSIS — E1122 Type 2 diabetes mellitus with diabetic chronic kidney disease: Secondary | ICD-10-CM | POA: Diagnosis not present

## 2022-03-31 DIAGNOSIS — I5033 Acute on chronic diastolic (congestive) heart failure: Secondary | ICD-10-CM | POA: Diagnosis not present

## 2022-03-31 DIAGNOSIS — L03116 Cellulitis of left lower limb: Secondary | ICD-10-CM | POA: Diagnosis not present

## 2022-04-04 DIAGNOSIS — D869 Sarcoidosis, unspecified: Secondary | ICD-10-CM | POA: Diagnosis not present

## 2022-04-04 DIAGNOSIS — N1831 Chronic kidney disease, stage 3a: Secondary | ICD-10-CM | POA: Diagnosis not present

## 2022-04-04 DIAGNOSIS — I130 Hypertensive heart and chronic kidney disease with heart failure and stage 1 through stage 4 chronic kidney disease, or unspecified chronic kidney disease: Secondary | ICD-10-CM | POA: Diagnosis not present

## 2022-04-04 DIAGNOSIS — L03116 Cellulitis of left lower limb: Secondary | ICD-10-CM | POA: Diagnosis not present

## 2022-04-04 DIAGNOSIS — I5033 Acute on chronic diastolic (congestive) heart failure: Secondary | ICD-10-CM | POA: Diagnosis not present

## 2022-04-04 DIAGNOSIS — E1122 Type 2 diabetes mellitus with diabetic chronic kidney disease: Secondary | ICD-10-CM | POA: Diagnosis not present

## 2022-04-04 DIAGNOSIS — D631 Anemia in chronic kidney disease: Secondary | ICD-10-CM | POA: Diagnosis not present

## 2022-04-04 DIAGNOSIS — I2510 Atherosclerotic heart disease of native coronary artery without angina pectoris: Secondary | ICD-10-CM | POA: Diagnosis not present

## 2022-04-04 DIAGNOSIS — J181 Lobar pneumonia, unspecified organism: Secondary | ICD-10-CM | POA: Diagnosis not present

## 2022-04-06 DIAGNOSIS — D631 Anemia in chronic kidney disease: Secondary | ICD-10-CM | POA: Diagnosis not present

## 2022-04-06 DIAGNOSIS — J181 Lobar pneumonia, unspecified organism: Secondary | ICD-10-CM | POA: Diagnosis not present

## 2022-04-06 DIAGNOSIS — D869 Sarcoidosis, unspecified: Secondary | ICD-10-CM | POA: Diagnosis not present

## 2022-04-06 DIAGNOSIS — L03116 Cellulitis of left lower limb: Secondary | ICD-10-CM | POA: Diagnosis not present

## 2022-04-06 DIAGNOSIS — N1831 Chronic kidney disease, stage 3a: Secondary | ICD-10-CM | POA: Diagnosis not present

## 2022-04-06 DIAGNOSIS — I5033 Acute on chronic diastolic (congestive) heart failure: Secondary | ICD-10-CM | POA: Diagnosis not present

## 2022-04-06 DIAGNOSIS — E1122 Type 2 diabetes mellitus with diabetic chronic kidney disease: Secondary | ICD-10-CM | POA: Diagnosis not present

## 2022-04-06 DIAGNOSIS — I2510 Atherosclerotic heart disease of native coronary artery without angina pectoris: Secondary | ICD-10-CM | POA: Diagnosis not present

## 2022-04-06 DIAGNOSIS — I130 Hypertensive heart and chronic kidney disease with heart failure and stage 1 through stage 4 chronic kidney disease, or unspecified chronic kidney disease: Secondary | ICD-10-CM | POA: Diagnosis not present

## 2022-04-08 ENCOUNTER — Telehealth: Payer: Self-pay | Admitting: Internal Medicine

## 2022-04-08 NOTE — Telephone Encounter (Signed)
Patient on waiting list to have procedures at the hospital.  He has been hospitalized in January for other issues (sepsis).  I want to see him back in the office to check him before we schedule anything.  Please make an appointment this can be next available do not need to use work in spot.

## 2022-04-08 NOTE — Telephone Encounter (Signed)
Left massage for pt to call back.  °

## 2022-04-11 DIAGNOSIS — J181 Lobar pneumonia, unspecified organism: Secondary | ICD-10-CM | POA: Diagnosis not present

## 2022-04-11 DIAGNOSIS — D869 Sarcoidosis, unspecified: Secondary | ICD-10-CM | POA: Diagnosis not present

## 2022-04-11 DIAGNOSIS — I5033 Acute on chronic diastolic (congestive) heart failure: Secondary | ICD-10-CM | POA: Diagnosis not present

## 2022-04-11 DIAGNOSIS — L03116 Cellulitis of left lower limb: Secondary | ICD-10-CM | POA: Diagnosis not present

## 2022-04-11 DIAGNOSIS — I2510 Atherosclerotic heart disease of native coronary artery without angina pectoris: Secondary | ICD-10-CM | POA: Diagnosis not present

## 2022-04-11 DIAGNOSIS — N1831 Chronic kidney disease, stage 3a: Secondary | ICD-10-CM | POA: Diagnosis not present

## 2022-04-11 DIAGNOSIS — D631 Anemia in chronic kidney disease: Secondary | ICD-10-CM | POA: Diagnosis not present

## 2022-04-11 DIAGNOSIS — I130 Hypertensive heart and chronic kidney disease with heart failure and stage 1 through stage 4 chronic kidney disease, or unspecified chronic kidney disease: Secondary | ICD-10-CM | POA: Diagnosis not present

## 2022-04-11 DIAGNOSIS — E1122 Type 2 diabetes mellitus with diabetic chronic kidney disease: Secondary | ICD-10-CM | POA: Diagnosis not present

## 2022-04-11 NOTE — Telephone Encounter (Signed)
Pt made aware of Dr. Carlean Purl recommendations: Pt scheduled for an office visit on 05/04/2022 at 1:30 PM with with Dr. Carlean Purl.    Pt made aware Pt verbalized understanding with all questions answered.

## 2022-04-12 ENCOUNTER — Other Ambulatory Visit: Payer: Self-pay | Admitting: *Deleted

## 2022-04-12 DIAGNOSIS — I739 Peripheral vascular disease, unspecified: Secondary | ICD-10-CM

## 2022-04-13 DIAGNOSIS — N1831 Chronic kidney disease, stage 3a: Secondary | ICD-10-CM | POA: Diagnosis not present

## 2022-04-13 DIAGNOSIS — L03116 Cellulitis of left lower limb: Secondary | ICD-10-CM | POA: Diagnosis not present

## 2022-04-13 DIAGNOSIS — E1122 Type 2 diabetes mellitus with diabetic chronic kidney disease: Secondary | ICD-10-CM | POA: Diagnosis not present

## 2022-04-13 DIAGNOSIS — I5033 Acute on chronic diastolic (congestive) heart failure: Secondary | ICD-10-CM | POA: Diagnosis not present

## 2022-04-13 DIAGNOSIS — I2510 Atherosclerotic heart disease of native coronary artery without angina pectoris: Secondary | ICD-10-CM | POA: Diagnosis not present

## 2022-04-13 DIAGNOSIS — I130 Hypertensive heart and chronic kidney disease with heart failure and stage 1 through stage 4 chronic kidney disease, or unspecified chronic kidney disease: Secondary | ICD-10-CM | POA: Diagnosis not present

## 2022-04-13 DIAGNOSIS — J181 Lobar pneumonia, unspecified organism: Secondary | ICD-10-CM | POA: Diagnosis not present

## 2022-04-13 DIAGNOSIS — D869 Sarcoidosis, unspecified: Secondary | ICD-10-CM | POA: Diagnosis not present

## 2022-04-13 DIAGNOSIS — D631 Anemia in chronic kidney disease: Secondary | ICD-10-CM | POA: Diagnosis not present

## 2022-04-14 DIAGNOSIS — E039 Hypothyroidism, unspecified: Secondary | ICD-10-CM | POA: Diagnosis not present

## 2022-04-14 DIAGNOSIS — E1122 Type 2 diabetes mellitus with diabetic chronic kidney disease: Secondary | ICD-10-CM | POA: Diagnosis not present

## 2022-04-14 DIAGNOSIS — L03116 Cellulitis of left lower limb: Secondary | ICD-10-CM | POA: Diagnosis not present

## 2022-04-14 DIAGNOSIS — D500 Iron deficiency anemia secondary to blood loss (chronic): Secondary | ICD-10-CM | POA: Diagnosis not present

## 2022-04-14 DIAGNOSIS — E782 Mixed hyperlipidemia: Secondary | ICD-10-CM | POA: Diagnosis not present

## 2022-04-14 DIAGNOSIS — E1142 Type 2 diabetes mellitus with diabetic polyneuropathy: Secondary | ICD-10-CM | POA: Diagnosis not present

## 2022-04-14 DIAGNOSIS — I2510 Atherosclerotic heart disease of native coronary artery without angina pectoris: Secondary | ICD-10-CM | POA: Diagnosis not present

## 2022-04-14 DIAGNOSIS — D860 Sarcoidosis of lung: Secondary | ICD-10-CM | POA: Diagnosis not present

## 2022-04-14 DIAGNOSIS — I10 Essential (primary) hypertension: Secondary | ICD-10-CM | POA: Diagnosis not present

## 2022-04-18 DIAGNOSIS — I5033 Acute on chronic diastolic (congestive) heart failure: Secondary | ICD-10-CM | POA: Diagnosis not present

## 2022-04-18 DIAGNOSIS — N1831 Chronic kidney disease, stage 3a: Secondary | ICD-10-CM | POA: Diagnosis not present

## 2022-04-18 DIAGNOSIS — D631 Anemia in chronic kidney disease: Secondary | ICD-10-CM | POA: Diagnosis not present

## 2022-04-18 DIAGNOSIS — L03116 Cellulitis of left lower limb: Secondary | ICD-10-CM | POA: Diagnosis not present

## 2022-04-18 DIAGNOSIS — J181 Lobar pneumonia, unspecified organism: Secondary | ICD-10-CM | POA: Diagnosis not present

## 2022-04-18 DIAGNOSIS — I130 Hypertensive heart and chronic kidney disease with heart failure and stage 1 through stage 4 chronic kidney disease, or unspecified chronic kidney disease: Secondary | ICD-10-CM | POA: Diagnosis not present

## 2022-04-18 DIAGNOSIS — E1122 Type 2 diabetes mellitus with diabetic chronic kidney disease: Secondary | ICD-10-CM | POA: Diagnosis not present

## 2022-04-18 DIAGNOSIS — D869 Sarcoidosis, unspecified: Secondary | ICD-10-CM | POA: Diagnosis not present

## 2022-04-18 DIAGNOSIS — I2510 Atherosclerotic heart disease of native coronary artery without angina pectoris: Secondary | ICD-10-CM | POA: Diagnosis not present

## 2022-04-21 DIAGNOSIS — L03116 Cellulitis of left lower limb: Secondary | ICD-10-CM | POA: Diagnosis not present

## 2022-04-21 DIAGNOSIS — E1122 Type 2 diabetes mellitus with diabetic chronic kidney disease: Secondary | ICD-10-CM | POA: Diagnosis not present

## 2022-04-21 DIAGNOSIS — D869 Sarcoidosis, unspecified: Secondary | ICD-10-CM | POA: Diagnosis not present

## 2022-04-21 DIAGNOSIS — D631 Anemia in chronic kidney disease: Secondary | ICD-10-CM | POA: Diagnosis not present

## 2022-04-21 DIAGNOSIS — I5033 Acute on chronic diastolic (congestive) heart failure: Secondary | ICD-10-CM | POA: Diagnosis not present

## 2022-04-21 DIAGNOSIS — I130 Hypertensive heart and chronic kidney disease with heart failure and stage 1 through stage 4 chronic kidney disease, or unspecified chronic kidney disease: Secondary | ICD-10-CM | POA: Diagnosis not present

## 2022-04-21 DIAGNOSIS — N1831 Chronic kidney disease, stage 3a: Secondary | ICD-10-CM | POA: Diagnosis not present

## 2022-04-21 DIAGNOSIS — J181 Lobar pneumonia, unspecified organism: Secondary | ICD-10-CM | POA: Diagnosis not present

## 2022-04-21 DIAGNOSIS — I2510 Atherosclerotic heart disease of native coronary artery without angina pectoris: Secondary | ICD-10-CM | POA: Diagnosis not present

## 2022-04-25 DIAGNOSIS — N1831 Chronic kidney disease, stage 3a: Secondary | ICD-10-CM | POA: Diagnosis not present

## 2022-04-25 DIAGNOSIS — J181 Lobar pneumonia, unspecified organism: Secondary | ICD-10-CM | POA: Diagnosis not present

## 2022-04-25 DIAGNOSIS — I2510 Atherosclerotic heart disease of native coronary artery without angina pectoris: Secondary | ICD-10-CM | POA: Diagnosis not present

## 2022-04-25 DIAGNOSIS — I130 Hypertensive heart and chronic kidney disease with heart failure and stage 1 through stage 4 chronic kidney disease, or unspecified chronic kidney disease: Secondary | ICD-10-CM | POA: Diagnosis not present

## 2022-04-25 DIAGNOSIS — L03116 Cellulitis of left lower limb: Secondary | ICD-10-CM | POA: Diagnosis not present

## 2022-04-25 DIAGNOSIS — D869 Sarcoidosis, unspecified: Secondary | ICD-10-CM | POA: Diagnosis not present

## 2022-04-25 DIAGNOSIS — D631 Anemia in chronic kidney disease: Secondary | ICD-10-CM | POA: Diagnosis not present

## 2022-04-25 DIAGNOSIS — I5033 Acute on chronic diastolic (congestive) heart failure: Secondary | ICD-10-CM | POA: Diagnosis not present

## 2022-04-25 DIAGNOSIS — E1122 Type 2 diabetes mellitus with diabetic chronic kidney disease: Secondary | ICD-10-CM | POA: Diagnosis not present

## 2022-04-25 NOTE — Progress Notes (Unsigned)
[facility] [place]  ASSESSMENT / PLAN: [name] is a 77 y.o. male with atherosclerosis of native arteries of bilateral lower extremities causing no symptoms.  patients with asymptomatic peripheral arterial disease or claudication have a 1-2% risk of developing chronic limb threatening ischemia, but a 15-30% risk of mortality in the next 5 years. Intervention should only be considered for medically optimized patients with disabling symptoms.   Recommend the following which can slow the progression of atherosclerosis and reduce the risk of major adverse cardiac / limb events:  Complete cessation from all tobacco products. Blood glucose control with goal A1c < 7%. Blood pressure control with goal blood pressure < 140/90 mmHg. Lipid reduction therapy with goal LDL-C <100 mg/dL (<70 if symptomatic from PAD).  Aspirin 43m PO QD.  Atorvastatin 40-80mg PO QD (or other "high intensity" statin therapy).  Follow-up with me in 1 year with repeat ankle-brachial index   CHIEF COMPLAINT: Follow-up from hospital  HISTORY OF PRESENT ILLNESS: [name] a 77y.o. male who presents to clinic for follow-up from recent hospitalization.  The patient had severe cellulitis of his left lower extremity.  Vascular consultation was requested given concern for peripheral arterial disease.  Noninvasive testing confirmed noncompressible tibial arteries, but adequate toe pressures for healing.  The patient returns to clinic and reports continued freedom from any kind of peripheral arterial symptom.  He is walking is much as he likes.  He reports no claudication or rest pain symptoms.  He has no ulcers about his feet.   Past Medical History:  Diagnosis Date   Adrenal insufficiency ([place]    Allergy    Anemia    Arthritis    feet    Broken foot 09/2019   had to wore a boot. Left    CKD (chronic kidney disease), stage III (HCC)    Coronary artery disease    has stents   Coronary  atherosclerosis of native coronary artery    Proximal LAD, posterior lateral stent widely patent-10/12/11   Diabetes mellitus    insulin and pills   Hearing loss    wears hearing aids   History of blood transfusion [facility]30/[facility]   Hyperlipidemia    Hypertension    OSA (obstructive sleep apnea)    [facility] [place] [facility] study   PVD (peripheral vascular disease) ([place]    Sarcoidosis    Sleep apnea    uses CPAP nightly   Thyroid disease    Type 2 diabetes mellitus ([place]     Past Surgical History:  Procedure Laterality Date   APPENDECTOMY     CATARACT EXTRACTION  2011   bilat   CHOLECYSTECTOMY  01/25/2011   Procedure: LAPAROSCOPIC CHOLECYSTECTOMY WITH INTRAOPERATIVE CHOLANGIOGRAM;  Surgeon: BJudieth Keens DO;  [place] [facility]  Service: General;  Laterality: N/A;   COLONOSCOPY     several   COLONOSCOPY WITH PROPOFOL N/A 01/03/2020   Procedure: COLONOSCOPY WITH PROPOFOL;  Surgeon: GGatha Mayer MD;  [facility];  Service: Endoscopy;  Laterality: N/A;   CORONARY ANGIOPLASTY     most recent 11/2009   CORONARY STENT INTERVENTION N/A 08/07/2018   Procedure: CORONARY STENT INTERVENTION;  Surgeon: VJettie Booze MD;  [facility];  Service: Cardiovascular;  Laterality: N/A;   CORONARY STENT INTERVENTION N/A 01/18/2021   Procedure: CORONARY STENT INTERVENTION;  Surgeon: CSherren Mocha MD;  Location: [place] [facility];  Service: Cardiovascular;  Laterality: N/A;   CORONARY STENT PLACEMENT  2009   in LAD and side branch PTCA   ESOPHAGOGASTRODUODENOSCOPY (EGD) WITH PROPOFOL N/A 01/03/2020   Procedure: ESOPHAGOGASTRODUODENOSCOPY (EGD) WITH PROPOFOL;  Surgeon: Gatha Mayer, MD;  [facility];  Service: Endoscopy;  Laterality: N/A;   HEMOSTASIS CLIP PLACEMENT  01/03/2020   Procedure: HEMOSTASIS CLIP PLACEMENT;  Surgeon: Gatha Mayer, MD;  [facility];  Service: Endoscopy;;   INTERCOSTAL NERVE BLOCK  2011, 06/2016   x2. lumbar spine   INTRAVASCULAR PRESSURE WIRE/FFR STUDY N/A 01/18/2021   Procedure: INTRAVASCULAR PRESSURE WIRE/FFR STUDY;  Surgeon: Sherren Mocha, MD;  Location: [place] [facility];  Service: Cardiovascular;  Laterality: N/A;   LEFT HEART CATHETERIZATION WITH CORONARY ANGIOGRAM Bilateral 10/12/2011   Procedure: LEFT HEART CATHETERIZATION WITH CORONARY ANGIOGRAM;  Surgeon: Candee Furbish, MD;  Location: [facility];  Service: Cardiovascular;  Laterality: Bilateral;   LEFT HEART CATHETERIZATION WITH CORONARY ANGIOGRAM N/A 04/01/2014   Procedure: LEFT HEART CATHETERIZATION WITH CORONARY ANGIOGRAM;  Surgeon: Candee Furbish, MD;  Location: [facility] CATH LAB;  Service: Cardiovascular;  Laterality: N/A;   POLYPECTOMY  01/03/2020   Procedure: POLYPECTOMY;  Surgeon: Gatha Mayer, MD;  [facility];  Service: Endoscopy;;   RIGHT/LEFT HEART CATH AND CORONARY ANGIOGRAPHY N/A 08/07/2018   Procedure: RIGHT/LEFT HEART CATH AND CORONARY ANGIOGRAPHY;  Surgeon: Jettie Booze, MD;  Location: [place] [facility];  Service: Cardiovascular;  Laterality: N/A;   RIGHT/LEFT HEART CATH AND CORONARY ANGIOGRAPHY N/A 12/25/2020   Procedure: RIGHT/LEFT HEART CATH AND CORONARY ANGIOGRAPHY;  Surgeon: Belva Crome, MD;  Location: [place] [facility];  Service: Cardiovascular;  Laterality: N/A;   TRANSFORAMINAL LUMBAR INTERBODY FUSION W/ MIS 1 LEVEL N/A 09/24/2021   Procedure: LUMBAR FOUR-FIVE MINIMALLY INVASIVE (MIS) TRANSFORAMINAL LUMBAR INTERBODY FUSION (TLIF) WITH METRX;  Surgeon: Judith Part, MD;  Location: [place];  Service: Neurosurgery;  Laterality: N/A;    Family History  Problem Relation Age of Onset   Kidney disease Mother    Diabetes Mother    Kidney cancer Mother    Heart attack Father    Asthma Sister    Anesthesia problems Sister        "Kidney's did not wake up"   Sarcoidosis Sister    Sarcoidosis Niece    Colon cancer  Neg Hx    Colon polyps Neg Hx    Rectal cancer Neg Hx    Stomach cancer Neg Hx     Social History   Socioeconomic History   Marital status: Married    Spouse name: Not on file   Number of children: 3   Years of education: Not on file   Highest education level: Not on file  Occupational History   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   Occupation: MANAGER    Employer: [employer]  Tobacco Use   Smoking status: Never   Smokeless tobacco: Never  Vaping Use   Vaping Use: Never used  Substance and Sexual Activity   Alcohol use: No   Drug use: No   Sexual activity: Not on file  Other Topics Concern   Not on file  Social History Narrative   Not on file   Social Determinants of Health   Financial Resource Strain: Not on file  Food Insecurity: No Food Insecurity (10/21/2021)   Hunger Vital Sign    Worried About Running Out of Food in the Last  Year: Never true    [place] in the Last Year: Never true  Transportation Needs: No Transportation Needs (10/21/2021)   PRAPARE - Hydrologist (Medical): No    Lack of Transportation (Non-Medical): No  Physical Activity: Not on file  Stress: Not on file  Social Connections: Not on file  Intimate Partner Violence: Not on file    Allergies  Allergen Reactions   Statins Other (See Comments)    Aggression  Pt ok to take crestor 40m once a week   Hydrocortisone Nausea Only   Ranexa [Ranolazine] Other (See Comments)    Severe weakness/near syncope after 1 dose Hallucinations    Hydrocodone Nausea And Vomiting   Miralax [Polyethylene Glycol] Nausea And Vomiting   Z-Pak [Azithromycin] Other (See Comments)    Raises blood sugar   Zaroxolyn [Metolazone] Other (See Comments)    Drained, no energy    Current Outpatient Medications  Medication Sig Dispense Refill   acetaminophen (TYLENOL) 500 MG tablet Take 1,000 mg by mouth daily as needed for mild pain (pain).     albuterol  (PROVENTIL) (2.5 MG/3ML) 0.083% nebulizer solution Take 3 mLs (2.5 mg total) by nebulization every 6 (six) hours as needed for wheezing or shortness of breath. Dx Code D86.0 360 mL 5   aspirin EC 81 MG tablet Take 81 mg by mouth every evening.      clopidogrel (PLAVIX) 75 MG tablet Take 1 tablet (75 mg total) by mouth daily. Restart on 09/28/21 (Patient taking differently: Take 75 mg by mouth in the morning.) 90 tablet 4   ezetimibe (ZETIA) 10 MG tablet Take 10 mg by mouth in the morning.     furosemide (LASIX) 80 MG tablet Take 1 tablet (80 mg total) by mouth daily as needed for edema (take Lasix 80 mg as needed if gains more than 3 pounds in 1 day or 5 pounds in 1 week). (Patient taking differently: Take 80 mg by mouth See admin instructions. Alternate 40 mg (1/2 tablet) and 80 mg every other day in the evening.) 30 tablet 0   hydrALAZINE (APRESOLINE) 25 MG tablet Take 1 tablet (25 mg total) by mouth 3 (three) times daily. 90 tablet 0   insulin aspart protamine- aspart (NOVOLOG MIX 70/30) (70-30) 100 UNIT/ML injection Inject 0.4 mLs (40 Units total) into the skin with breakfast, with lunch, and with evening meal. (Patient taking differently: Inject 35 Units into the skin 2 (two) times daily.)     Iron, Ferrous Sulfate, 325 (65 Fe) MG TABS Take 325 mg by mouth daily. (Patient not taking: Reported on 03/15/2022) 90 tablet 3   levothyroxine (SYNTHROID) 75 MCG tablet Take 75 mcg by mouth in the morning.     metoprolol tartrate (LOPRESSOR) 25 MG tablet Take 0.5 tablets (12.5 mg total) by mouth 2 (two) times daily. (Patient taking differently: Take 25 mg by mouth in the morning.) 90 tablet 3   montelukast (SINGULAIR) 10 MG tablet Take 10 mg by mouth daily as needed (allergies).  (Patient not taking: Reported on 03/15/2022)     nitroGLYCERIN (NITROSTAT) 0.4 MG SL tablet Place 1 tablet (0.4 mg total) under the tongue every 5 (five) minutes as needed for chest pain. 25 tablet 3   oxyCODONE (OXY IR/ROXICODONE) 5 MG  immediate release tablet Take 1 tablet (5 mg total) by mouth every 4 (four) hours as needed for severe pain or breakthrough pain. (Patient not taking: Reported on 03/15/2022) 10 tablet 0  potassium chloride (KLOR-CON M) 10 MEQ tablet Take 10 mEq by mouth in the morning.     predniSONE (DELTASONE) 10 MG tablet Take 10 mg by mouth in the morning. Continuous course.     RABEprazole (ACIPHEX) 20 MG tablet Take 20 mg by mouth daily as needed (GERD).     rosuvastatin (CRESTOR) 20 MG tablet Take 1 tablet (20 mg total) by mouth at bedtime. (Patient taking differently: Take 20 mg by mouth every Sunday.) 90 tablet 3   No current facility-administered medications for this visit.    PHYSICAL EXAM There were no vitals filed for this visit.  Elderly man in no distress Regular rate and rhythm Unlabored breathing Resolving cellulitis of the left lower extremity No palpable pedal pulses   PERTINENT LABORATORY AND RADIOLOGIC DATA  Most recent CBC    Latest Ref Rng & Units 03/21/2022    4:54 AM 03/20/2022    2:50 AM 03/19/2022    2:32 AM  CBC  WBC 4.0 - 10.5 K/uL 15.6  13.7  13.1   Hemoglobin 13.0 - 17.0 g/dL 13.1  11.1  11.0   Hematocrit 39.0 - 52.0 % 42.4  35.8  35.0   Platelets 150 - 400 K/uL 235  238  226      Most recent CMP    Latest Ref Rng & Units 03/21/2022    4:54 AM 03/20/2022    2:50 AM 03/19/2022    2:32 AM  CMP  Glucose 70 - 99 mg/dL 99  149  221   BUN 8 - 23 mg/dL 16  16  22   $ Creatinine 0.61 - 1.24 mg/dL 1.60  1.63  1.79   Sodium 135 - 145 mmol/L 138  138  136   Potassium 3.5 - 5.1 mmol/L 3.9  3.3  3.4   Chloride 98 - 111 mmol/L 99  102  100   CO2 22 - 32 mmol/L 26  28  26   $ Calcium 8.9 - 10.3 mg/dL 8.3  7.9  7.8     Renal function CrCl cannot be calculated (Patient's most recent lab result is older than the maximum 21 days allowed.).  Hgb A1c MFr Bld (%)  Date Value  03/17/2022 9.4 (H)    LDL Chol Calc (NIH)  Date Value Ref Range Status  01/26/2021 76 0 - 99 mg/dL  Final   LDL Cholesterol  Date Value Ref Range Status  03/18/2022 33 0 - 99 mg/dL Final    Comment:           Total Cholesterol/HDL:CHD Risk Coronary Heart Disease Risk Table                     Men   Women  1/2 Average Risk   3.4   3.3  Average Risk       5.0   4.4  2 X Average Risk   9.6   7.1  3 X Average Risk  23.4   11.0        Use the calculated Patient Ratio above and the CHD Risk Table to determine the patient's CHD Risk.        ATP III CLASSIFICATION (LDL):  <100     mg/dL   Optimal  100-129  mg/dL   Near or Above                    Optimal  130-159  mg/dL   Borderline  160-189  mg/dL   High  >  190     mg/dL   Very High Performed at [facility], [place] [address], [place], [place] [address]      +-------+---------------+-----------+---------------+------------+  ABI/TBIToday's ABI    Today's TBIPrevious ABI   Previous TBI  +-------+---------------+-----------+---------------+------------+  Right Noncompressible0.46       Noncompressible0.94          +-------+---------------+-----------+---------------+------------+  Left  Noncompressible0.50       Not obtained   0.39          +-------+---------------+-----------+---------------+------------+     Craig Aline. Stanford Breed, MD [facility] Phone Number: [phone] 04/25/2022 8:17 PM   Total time spent on preparing this encounter including chart review, data review, collecting history, examining the patient, coordinating care for this established patient, 30 minutes.  Portions of this report may have been transcribed using voice recognition software.  Every effort has been made to ensure accuracy; however, inadvertent computerized transcription errors may still be present.

## 2022-04-26 ENCOUNTER — Ambulatory Visit: Payer: Medicare HMO | Admitting: Vascular Surgery

## 2022-04-26 ENCOUNTER — Encounter: Payer: Self-pay | Admitting: Vascular Surgery

## 2022-04-26 ENCOUNTER — Ambulatory Visit (HOSPITAL_COMMUNITY)
Admission: RE | Admit: 2022-04-26 | Discharge: 2022-04-26 | Disposition: A | Discharge status: Home / Self Care | Payer: Medicare HMO | Source: Ambulatory Visit | Attending: Vascular Surgery | Admitting: Vascular Surgery

## 2022-04-26 VITALS — BP 161/73 | HR 53 | Temp 98.6°F | Resp 20 | Ht 70.0 in | Wt 219.0 lb

## 2022-04-26 DIAGNOSIS — I739 Peripheral vascular disease, unspecified: Secondary | ICD-10-CM | POA: Diagnosis not present

## 2022-04-26 DIAGNOSIS — L03116 Cellulitis of left lower limb: Secondary | ICD-10-CM | POA: Diagnosis not present

## 2022-04-26 DIAGNOSIS — E1165 Type 2 diabetes mellitus with hyperglycemia: Secondary | ICD-10-CM | POA: Diagnosis not present

## 2022-04-26 LAB — VAS US ABI WITH/WO TBI

## 2022-04-28 DIAGNOSIS — E1122 Type 2 diabetes mellitus with diabetic chronic kidney disease: Secondary | ICD-10-CM | POA: Diagnosis not present

## 2022-04-28 DIAGNOSIS — L03116 Cellulitis of left lower limb: Secondary | ICD-10-CM | POA: Diagnosis not present

## 2022-04-28 DIAGNOSIS — I2510 Atherosclerotic heart disease of native coronary artery without angina pectoris: Secondary | ICD-10-CM | POA: Diagnosis not present

## 2022-04-28 DIAGNOSIS — I130 Hypertensive heart and chronic kidney disease with heart failure and stage 1 through stage 4 chronic kidney disease, or unspecified chronic kidney disease: Secondary | ICD-10-CM | POA: Diagnosis not present

## 2022-04-28 DIAGNOSIS — D631 Anemia in chronic kidney disease: Secondary | ICD-10-CM | POA: Diagnosis not present

## 2022-04-28 DIAGNOSIS — J181 Lobar pneumonia, unspecified organism: Secondary | ICD-10-CM | POA: Diagnosis not present

## 2022-04-28 DIAGNOSIS — D869 Sarcoidosis, unspecified: Secondary | ICD-10-CM | POA: Diagnosis not present

## 2022-04-28 DIAGNOSIS — I5033 Acute on chronic diastolic (congestive) heart failure: Secondary | ICD-10-CM | POA: Diagnosis not present

## 2022-04-28 DIAGNOSIS — N1831 Chronic kidney disease, stage 3a: Secondary | ICD-10-CM | POA: Diagnosis not present

## 2022-05-03 DIAGNOSIS — D869 Sarcoidosis, unspecified: Secondary | ICD-10-CM | POA: Diagnosis not present

## 2022-05-03 DIAGNOSIS — I130 Hypertensive heart and chronic kidney disease with heart failure and stage 1 through stage 4 chronic kidney disease, or unspecified chronic kidney disease: Secondary | ICD-10-CM | POA: Diagnosis not present

## 2022-05-03 DIAGNOSIS — L03116 Cellulitis of left lower limb: Secondary | ICD-10-CM | POA: Diagnosis not present

## 2022-05-03 DIAGNOSIS — E1122 Type 2 diabetes mellitus with diabetic chronic kidney disease: Secondary | ICD-10-CM | POA: Diagnosis not present

## 2022-05-03 DIAGNOSIS — N1831 Chronic kidney disease, stage 3a: Secondary | ICD-10-CM | POA: Diagnosis not present

## 2022-05-03 DIAGNOSIS — J181 Lobar pneumonia, unspecified organism: Secondary | ICD-10-CM | POA: Diagnosis not present

## 2022-05-03 DIAGNOSIS — D631 Anemia in chronic kidney disease: Secondary | ICD-10-CM | POA: Diagnosis not present

## 2022-05-03 DIAGNOSIS — I2510 Atherosclerotic heart disease of native coronary artery without angina pectoris: Secondary | ICD-10-CM | POA: Diagnosis not present

## 2022-05-03 DIAGNOSIS — I5033 Acute on chronic diastolic (congestive) heart failure: Secondary | ICD-10-CM | POA: Diagnosis not present

## 2022-05-04 ENCOUNTER — Ambulatory Visit: Payer: Medicare HMO | Admitting: Internal Medicine

## 2022-05-11 DIAGNOSIS — I10 Essential (primary) hypertension: Secondary | ICD-10-CM | POA: Diagnosis not present

## 2022-05-11 DIAGNOSIS — E1122 Type 2 diabetes mellitus with diabetic chronic kidney disease: Secondary | ICD-10-CM | POA: Diagnosis not present

## 2022-05-11 DIAGNOSIS — D509 Iron deficiency anemia, unspecified: Secondary | ICD-10-CM | POA: Diagnosis not present

## 2022-05-11 DIAGNOSIS — G4733 Obstructive sleep apnea (adult) (pediatric): Secondary | ICD-10-CM | POA: Diagnosis not present

## 2022-05-11 DIAGNOSIS — E039 Hypothyroidism, unspecified: Secondary | ICD-10-CM | POA: Diagnosis not present

## 2022-05-11 DIAGNOSIS — R600 Localized edema: Secondary | ICD-10-CM | POA: Diagnosis not present

## 2022-05-11 DIAGNOSIS — E7800 Pure hypercholesterolemia, unspecified: Secondary | ICD-10-CM | POA: Diagnosis not present

## 2022-05-11 DIAGNOSIS — E1142 Type 2 diabetes mellitus with diabetic polyneuropathy: Secondary | ICD-10-CM | POA: Diagnosis not present

## 2022-05-14 DIAGNOSIS — H52209 Unspecified astigmatism, unspecified eye: Secondary | ICD-10-CM | POA: Diagnosis not present

## 2022-05-14 DIAGNOSIS — H5203 Hypermetropia, bilateral: Secondary | ICD-10-CM | POA: Diagnosis not present

## 2022-05-14 DIAGNOSIS — H524 Presbyopia: Secondary | ICD-10-CM | POA: Diagnosis not present

## 2022-05-14 DIAGNOSIS — E119 Type 2 diabetes mellitus without complications: Secondary | ICD-10-CM | POA: Diagnosis not present

## 2022-05-21 ENCOUNTER — Other Ambulatory Visit (HOSPITAL_COMMUNITY): Payer: Self-pay

## 2022-05-27 DIAGNOSIS — Z6830 Body mass index (BMI) 30.0-30.9, adult: Secondary | ICD-10-CM | POA: Diagnosis not present

## 2022-05-27 DIAGNOSIS — M5489 Other dorsalgia: Secondary | ICD-10-CM | POA: Diagnosis not present

## 2022-05-27 DIAGNOSIS — M4326 Fusion of spine, lumbar region: Secondary | ICD-10-CM | POA: Diagnosis not present

## 2022-05-31 DIAGNOSIS — R051 Acute cough: Secondary | ICD-10-CM | POA: Diagnosis not present

## 2022-05-31 DIAGNOSIS — J019 Acute sinusitis, unspecified: Secondary | ICD-10-CM | POA: Diagnosis not present

## 2022-05-31 DIAGNOSIS — Z03818 Encounter for observation for suspected exposure to other biological agents ruled out: Secondary | ICD-10-CM | POA: Diagnosis not present

## 2022-05-31 DIAGNOSIS — D869 Sarcoidosis, unspecified: Secondary | ICD-10-CM | POA: Diagnosis not present

## 2022-05-31 DIAGNOSIS — R600 Localized edema: Secondary | ICD-10-CM | POA: Diagnosis not present

## 2022-06-01 ENCOUNTER — Other Ambulatory Visit: Payer: Self-pay | Admitting: Physician Assistant

## 2022-06-01 DIAGNOSIS — M4326 Fusion of spine, lumbar region: Secondary | ICD-10-CM

## 2022-06-17 ENCOUNTER — Encounter: Payer: Self-pay | Admitting: Cardiology

## 2022-06-17 ENCOUNTER — Ambulatory Visit: Payer: Medicare HMO | Attending: Cardiology | Admitting: Cardiology

## 2022-06-17 VITALS — BP 120/60 | HR 55 | Ht 70.0 in | Wt 211.0 lb

## 2022-06-17 DIAGNOSIS — G4733 Obstructive sleep apnea (adult) (pediatric): Secondary | ICD-10-CM

## 2022-06-17 DIAGNOSIS — E785 Hyperlipidemia, unspecified: Secondary | ICD-10-CM | POA: Diagnosis not present

## 2022-06-17 DIAGNOSIS — I10 Essential (primary) hypertension: Secondary | ICD-10-CM

## 2022-06-17 DIAGNOSIS — I2510 Atherosclerotic heart disease of native coronary artery without angina pectoris: Secondary | ICD-10-CM | POA: Diagnosis not present

## 2022-06-17 NOTE — Progress Notes (Signed)
Cardiology Office Note:    Date:  06/17/2022   ID:  [name], DOB [birth year], MRN [id number]  PCP:  Johny Blamer, MD    [facility] Providers Cardiologist:  Donato Schultz, MD     Referring MD: Johny Blamer, MD    History of Present Illness:    [name] is a 77 y.o. male with multivessel coronary artery disease status post multiple percutaneous interventions, DES to LAD and PLA in 2011, DES x 2 to the right posterior descending artery 2020, DES to proximal RCA mid RCA and first diagonal with cardiac catheterization performed in June 2023 unchanged from prior here for follow-up.  Has chronic diastolic heart failure pulmonary sarcoidosis peripheral arterial disease with left tibial disease seen by Dr. Juanetta Gosling, hypertension hyperlipidemia adrenal insufficiency on chronic prednisone, anemia with prior GI bleed, obstructive sleep apnea, chronic kidney disease stage IIIa, lumbar spine fusion.  Prior hospital note from August 2023 reviewed.  Telemetry showed frequent PVCs and short runs of NSVT 3-4 beats.  Had a couple accelerated idioventricular rhythms with rates in the 80s.  Toprol 25 mg was started.    ZIO monitor on 01/12/2022 showed   Normal sinus rhythm with avg HR 77 bpm   Rare atrial tachycardia   Rare PAC's. Frequent PVC's 8.3%.   No atrial fibrillation  Chronic diastolic heart failure was also treated.  BNP previously minimally elevated at 118.  Echocardiogram 2023 showed EF of 60%.  03/2022 - Cellulitis. Hospital visit.   Still having back pain despite surgery.  Earlier broke foot, small wound ended up with infection.  IV antibiotics.  [facility]  Was told here he may need an amputation.  He is Dr. Lenell Antu looked at arterial flow and nothing is significant to warrant surgery or stenting.   Past Medical History:  Diagnosis Date   Adrenal insufficiency    Allergy    Anemia    Arthritis    feet    Broken foot 09/2019   had to wore a boot. Left    CKD  (chronic kidney disease), stage III    Coronary artery disease    has stents   Coronary atherosclerosis of native coronary artery    Proximal LAD, posterior lateral stent widely patent-10/12/11   Diabetes mellitus    insulin and pills   Hearing loss    wears hearing aids   History of blood transfusion 06/30/2016   [facility] - 2 units transfused   Hyperlipidemia    Hypertension    OSA (obstructive sleep apnea)    uses VPAC sleep study 2 years done through [facility]. Dr. Anne Fu arranged study   PVD (peripheral vascular disease)    Sarcoidosis    Sleep apnea    uses CPAP nightly   Thyroid disease    Type 2 diabetes mellitus     Past Surgical History:  Procedure Laterality Date   APPENDECTOMY     CATARACT EXTRACTION  2011   bilat   CHOLECYSTECTOMY  01/25/2011   Procedure: LAPAROSCOPIC CHOLECYSTECTOMY WITH INTRAOPERATIVE CHOLANGIOGRAM;  Surgeon: Rulon Abide, DO;  Location: [facility] OR;  Service: General;  Laterality: N/A;   COLONOSCOPY     several   COLONOSCOPY WITH PROPOFOL N/A 01/03/2020   Procedure: COLONOSCOPY WITH PROPOFOL;  Surgeon: Iva Boop, MD;  Location: [facility] ENDOSCOPY;  Service: Endoscopy;  Laterality: N/A;   CORONARY ANGIOPLASTY     most recent 11/2009   CORONARY PRESSURE/FFR STUDY N/A 01/18/2021   Procedure: INTRAVASCULAR PRESSURE WIRE/FFR  STUDY;  Surgeon: Tonny Bollman, MD;  Location: [facility] INVASIVE CV LAB;  Service: Cardiovascular;  Laterality: N/A;   CORONARY STENT INTERVENTION N/A 08/07/2018   Procedure: CORONARY STENT INTERVENTION;  Surgeon: Corky Crafts, MD;  Location: [facility] INVASIVE CV LAB;  Service: Cardiovascular;  Laterality: N/A;   CORONARY STENT INTERVENTION N/A 01/18/2021   Procedure: CORONARY STENT INTERVENTION;  Surgeon: Tonny Bollman, MD;  Location: [facility] INVASIVE CV LAB;  Service: Cardiovascular;  Laterality: N/A;   CORONARY STENT PLACEMENT  2009   in LAD and side branch PTCA   ESOPHAGOGASTRODUODENOSCOPY (EGD) WITH PROPOFOL N/A 01/03/2020    Procedure: ESOPHAGOGASTRODUODENOSCOPY (EGD) WITH PROPOFOL;  Surgeon: Iva Boop, MD;  Location: [facility]OSCOPY;  Service: Endoscopy;  Laterality: N/A;   HEMOSTASIS CLIP PLACEMENT  01/03/2020   Procedure: HEMOSTASIS CLIP PLACEMENT;  Surgeon: Iva Boop, MD;  Location: [facility] ENDOSCOPY;  Service: Endoscopy;;   INTERCOSTAL NERVE BLOCK  2011, 06/2016   x2. lumbar spine   LEFT HEART CATHETERIZATION WITH CORONARY ANGIOGRAM Bilateral 10/12/2011   Procedure: LEFT HEART CATHETERIZATION WITH CORONARY ANGIOGRAM;  Surgeon: Donato Schultz, MD;  Location: [facility] CATH LAB;  Service: Cardiovascular;  Laterality: Bilateral;   LEFT HEART CATHETERIZATION WITH CORONARY ANGIOGRAM N/A 04/01/2014   Procedure: LEFT HEART CATHETERIZATION WITH CORONARY ANGIOGRAM;  Surgeon: Donato Schultz, MD;  Location: [facility] CATH LAB;  Service: Cardiovascular;  Laterality: N/A;   POLYPECTOMY  01/03/2020   Procedure: POLYPECTOMY;  Surgeon: Iva Boop, MD;  Location: [facility]OSCOPY;  Service: Endoscopy;;   RIGHT/LEFT HEART CATH AND CORONARY ANGIOGRAPHY N/A 08/07/2018   Procedure: RIGHT/LEFT HEART CATH AND CORONARY ANGIOGRAPHY;  Surgeon: Corky Crafts, MD;  Location: [facility] INVASIVE CV LAB;  Service: Cardiovascular;  Laterality: N/A;   RIGHT/LEFT HEART CATH AND CORONARY ANGIOGRAPHY N/A 12/25/2020   Procedure: RIGHT/LEFT HEART CATH AND CORONARY ANGIOGRAPHY;  Surgeon: Lyn Records, MD;  Location: [facility] INVASIVE CV LAB;  Service: Cardiovascular;  Laterality: N/A;   TRANSFORAMINAL LUMBAR INTERBODY FUSION W/ MIS 1 LEVEL N/A 09/24/2021   Procedure: LUMBAR FOUR-FIVE MINIMALLY INVASIVE (MIS) TRANSFORAMINAL LUMBAR INTERBODY FUSION (TLIF) WITH METRX;  Surgeon: Jadene Pierini, MD;  Location: [facility] OR;  Service: Neurosurgery;  Laterality: N/A;    Current Medications: Current Meds  Medication Sig   acetaminophen (TYLENOL) 500 MG tablet Take 1,000 mg by mouth daily as needed for mild pain (pain).   albuterol (PROVENTIL) (2.5 MG/3ML) 0.083% nebulizer solution  Take 3 mLs (2.5 mg total) by nebulization every 6 (six) hours as needed for wheezing or shortness of breath. Dx Code D86.0   aspirin EC 81 MG tablet Take 81 mg by mouth every evening.    clopidogrel (PLAVIX) 75 MG tablet Take 1 tablet (75 mg total) by mouth daily. Restart on 09/28/21   ezetimibe (ZETIA) 10 MG tablet Take 10 mg by mouth in the morning.   furosemide (LASIX) 80 MG tablet Take 1 tablet (80 mg total) by mouth daily as needed for edema (take Lasix 80 mg as needed if gains more than 3 pounds in 1 day or 5 pounds in 1 week).   hydrALAZINE (APRESOLINE) 25 MG tablet Take 1 tablet (25 mg total) by mouth 3 (three) times daily.   insulin aspart protamine- aspart (NOVOLOG MIX 70/30) (70-30) 100 UNIT/ML injection Inject 0.4 mLs (40 Units total) into the skin with breakfast, with lunch, and with evening meal.   Iron, Ferrous Sulfate, 325 (65 Fe) MG TABS Take 325 mg by mouth daily.   levothyroxine (SYNTHROID) 75 MCG tablet Take 75  mcg by mouth in the morning.   metoprolol tartrate (LOPRESSOR) 25 MG tablet Take 0.5 tablets (12.5 mg total) by mouth 2 (two) times daily.   montelukast (SINGULAIR) 10 MG tablet Take 10 mg by mouth daily as needed (allergies).   nitroGLYCERIN (NITROSTAT) 0.4 MG SL tablet Place 1 tablet (0.4 mg total) under the tongue every 5 (five) minutes as needed for chest pain.   potassium chloride (KLOR-CON M) 10 MEQ tablet Take 10 mEq by mouth in the morning.   predniSONE (DELTASONE) 10 MG tablet Take 10 mg by mouth in the morning. Continuous course.   RABEprazole (ACIPHEX) 20 MG tablet Take 20 mg by mouth daily as needed (GERD).   rosuvastatin (CRESTOR) 20 MG tablet Take 1 tablet (20 mg total) by mouth at bedtime. 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   Occupation: MANAGER    Employer: [employer]  Tobacco Use   Smoking status: Never   Smokeless tobacco: Never  Vaping Use   Vaping Use: Never used  Substance and Sexual Activity   Alcohol use: No   Drug use: No   Sexual activity: Not on file  Other Topics Concern   Not on file  Social History Narrative   Not on file   Social Determinants of Health   Financial Resource Strain: Not on file  Food Insecurity: No Food Insecurity (10/21/2021)   Hunger Vital Sign    Worried About Running Out of Food in the Last Year: Never true    Ran Out of Food in the Last Year: Never true  Transportation Needs: No Transportation Needs (10/21/2021)   PRAPARE - Administrator, Civil Service (Medical): No    Lack of Transportation (Non-Medical): No  Physical Activity: Not on file  Stress: Not on file  Social Connections: Not on file     Family History: The patient's family history includes Anesthesia problems in his sister; Asthma in his sister; Diabetes in his mother; Heart attack in his father; Kidney cancer in his mother; Kidney disease in his mother; Sarcoidosis in his niece and sister. There is no history of Colon cancer, Colon polyps, Rectal cancer, or Stomach cancer.  ROS:   Please see the history of present illness.     All other systems reviewed and are negative.  EKGs/Labs/Other Studies Reviewed:    The following studies were reviewed today: Cardiac Studies & Procedures   CARDIAC CATHETERIZATION  CARDIAC CATHETERIZATION 01/18/2021  Narrative   Prox LAD-1 lesion is 40% stenosed.   Prox LAD-2 lesion is 65% stenosed.   Mid Cx lesion is 50% stenosed.   Mid RCA lesion is 60% stenosed.   Prox RCA lesion is 75% stenosed.   Ost Ramus to Ramus  lesion is 70% stenosed.   Ost 1st Diag lesion is 80% stenosed.   Non-stenotic Ost RPDA lesion was previously treated.   Non-stenotic Ost RPDA to RPDA lesion was previously treated.   Non-stenotic RPAV lesion was previously treated.   A drug-eluting stent was successfully placed using a STENT  ONYX FRONTIER 3.5X15.   A drug-eluting stent was successfully placed using a STENT ONYX FRONTIER 3.5X18.   A drug-eluting stent was successfully placed using a STENT ONYX FRONTIER 2.5X18.   Post intervention, there is a 0% residual stenosis.   Post intervention, there is a 0% residual stenosis.   Post intervention, there is a 0% residual stenosis.  Successful two-vessel PCI guided by RFR/FFR with stenting of severe stenoses in the proximal and mid RCA and severe stenosis in the first diagonal branch of the LAD.  All lesions treated with Onyx frontier drug-eluting stents (total of 3 stents implanted).  Patient should be treated with dual antiplatelet therapy with aspirin and clopidogrel for minimum of 6 months, favor long-term therapy if well-tolerated considering his multivessel stenting.  Same-day PCI protocol.  Findings Coronary Findings Diagnostic  Dominance: Right  Left Anterior Descending There is mild diffuse disease throughout the vessel. Prox LAD-1 lesion is 40% stenosed. The lesion was previously treated using a drug eluting stent over 2 years ago. Prox LAD-2 lesion is 65% stenosed.  First Diagonal Branch There is moderate disease in the vessel. Ost 1st Diag lesion is 80% stenosed.  Ramus Intermedius Ost Ramus to Ramus lesion is 70% stenosed.  Left Circumflex Vessel is small. There is mild diffuse disease throughout the vessel. Mid Cx lesion is 50% stenosed.  Right Coronary Artery There is mild diffuse disease throughout the vessel. Prox RCA lesion is 75% stenosed. Mid RCA lesion is 60% stenosed.  Right Posterior Descending Artery Non-stenotic Ost RPDA lesion was previously  treated. Non-stenotic Ost RPDA to RPDA lesion was previously treated.  Right Posterior Atrioventricular Artery Non-stenotic RPAV lesion was previously treated.  Intervention  Ost 1st Diag lesion Stent [place] GUIDE 6FR XBLAD3.5 guide catheter was inserted. Lesion crossed with guidewire using a GUIDEWIRE PRESSURE X 175. Pre-stent angioplasty was performed. A drug-eluting stent was successfully placed using a STENT ONYX FRONTIER 2.5X18. Maximum pressure: 14 atm. Post-stent angioplasty was not performed. Post-Intervention Lesion Assessment The intervention was successful. Pre-interventional TIMI flow is 3. Post-intervention TIMI flow is 3. No complications occurred at this lesion. There is a 0% residual stenosis post intervention.  Prox RCA lesion Stent Pre-stent angioplasty was performed using a BALLN SAPPHIRE 2.5X12. A drug-eluting stent was successfully placed using a STENT ONYX FRONTIER 3.5X15. Maximum pressure: 18 atm. Post-stent angioplasty was performed using a BALLN SAPPHIRE [place] 4.0X12. Maximum pressure:  16 atm. Post-Intervention Lesion Assessment The intervention was successful. Pre-interventional TIMI flow is 3. Post-intervention TIMI flow is 3. No complications occurred at this lesion. There is a 0% residual stenosis post intervention.  Mid RCA lesion Stent CATH VISTA GUIDE 6FR JR4 guide catheter was inserted. Lesion crossed with guidewire using a WIRE COUGAR XT STRL 190CM. Pre-stent angioplasty was performed using a BALLN SAPPHIRE 2.5X12. A drug-eluting stent was successfully placed using a STENT ONYX FRONTIER 3.5X18. Maximum pressure: 18 atm. Post-stent angioplasty was performed using a BALLN SAPPHIRE [place] 4.0X12. Maximum pressure:  14 atm. Post-Intervention Lesion Assessment The intervention was successful. Pre-interventional TIMI flow is 3. Post-intervention TIMI flow is 3. No complications occurred at this lesion. There is a 0% residual stenosis post  intervention.   CARDIAC CATHETERIZATION  CARDIAC CATHETERIZATION 12/25/2020  Narrative CONCLUSIONS: Diffuse three-vessel coronary disease is noted on diagram with moderate LAD beyond the stented segment, moderately severe diagonal, moderate less than or equal to 70% ramus, new 75 to 80% proximal and 60% mid to distal RCA.  Left main is unremarkable. Physiology assessment across the  tandem right coronary lesions did not demonstrate ischemia (RFR 0.91). Low normal to mildly depressed LV function, EF 45 to 50%.  EDP 17 mmHg. Mild pulmonary hypertension with mean pressure 71 mmHg.  Wedge pressure 10 mmHg.  RECOMMENDATIONS:  Given chronic kidney disease, multiple lesions, and no specific target that is clearly a culprit, my approach was more conservative. Recommend consideration of SGLT2 therapy for component of chronic midrange combined systolic and diastolic heart failure. RCA could be treated although no physiologic support noted today. Diagonal is largely unchanged.  Findings Coronary Findings Diagnostic  Dominance: Right  Left Anterior Descending There is mild diffuse disease throughout the vessel. Prox LAD-1 lesion is 40% stenosed. The lesion was previously treated using a drug eluting stent over 2 years ago. Prox LAD-2 lesion is 65% stenosed.  First Diagonal Branch There is moderate disease in the vessel. Ost 1st Diag lesion is 75% stenosed.  Ramus Intermedius Ost Ramus to Ramus lesion is 70% stenosed.  Left Circumflex Vessel is small. There is mild diffuse disease throughout the vessel. Mid Cx lesion is 50% stenosed.  Right Coronary Artery There is mild diffuse disease throughout the vessel. Prox RCA lesion is 75% stenosed. Mid RCA lesion is 60% stenosed.  Right Posterior Descending Artery Non-stenotic Ost RPDA lesion was previously treated. Non-stenotic Ost RPDA to RPDA lesion was previously treated.  Right Posterior Atrioventricular Artery Non-stenotic RPAV  lesion was previously treated.  Intervention  No interventions have been documented.   STRESS TESTS  NM MYOCAR MULTI W/SPECT W 11/09/2010  Narrative *RADIOLOGY REPORT*  Clinical Data:  Chest pain.  Diabetes and hypertension.  Coronary artery disease.  MYOCARDIAL IMAGING WITH SPECT (REST AND PHARMACOLOGIC-STRESS) GATED LEFT VENTRICULAR WALL MOTION STUDY LEFT VENTRICULAR EJECTION FRACTION  Technique:  Standard myocardial SPECT imaging was performed after resting intravenous injection of 10 mCi Tc-98m tetrofosmin. Subsequently, intravenous infusion of Lexiscan was performed under the supervision of the Cardiology staff.  At peak effect of the drug, 30 mCi Tc-32m tetrofosmin was injected intravenously and standard myocardial SPECT  imaging was performed.  Quantitative gated imaging was also performed to evaluate left ventricular wall motion, and estimate left ventricular ejection fraction.  Comparison:  None.  Findings: Quality of the exam is good.  Myocardial SPECT imaging shows no evidence of reversible myocardial perfusion defects to suggest presence of myocardial ischemia.  Fixed inferolateral wall defect is consistent with diaphragmatic attenuation.  Left ventricular wall motion study shows no evidence of regional wall motion abnormality.  Estimated left ventricular ejection fraction is fraction is 56%.  IMPRESSION:  1.  No evidence of pharmacologic induced myocardial ischemia. 2.  Left ventricular wall motion study within normal limits, with calculated ejection fraction of 56%.  Original Report Authenticated By: Danae Orleans, M.D.   ECHOCARDIOGRAM  ECHOCARDIOGRAM LIMITED 03/17/2022  Narrative ECHOCARDIOGRAM LIMITED REPORT    Patient Name:   [name] Date of Exam: 03/17/2022 Medical Rec #:  [id number]  Height:       70.0 in Accession #:    [id number] Weight:       215.0 lb Date of Birth:  1945-12-08  BSA:          2.152 m Patient Age:    76 years   BP:            133/68 mmHg Patient Gender: M          HR:           65 bpm. Exam Location:  Inpatient  Procedure:  Limited Echo, Limited Color Doppler and Color Doppler  Indications:    other abnormalities of the heart  History:        Patient has prior history of Echocardiogram examinations, most recent 10/21/2021. CAD, chronic kidney disease. Sarcoidosis; Risk Factors:Sleep Apnea, Diabetes, Dyslipidemia and Hypertension.  Sonographer:    Delcie Roch RDCS Referring Phys: [id number] TAYE T GONFA  IMPRESSIONS   1. Diffiicult acoustic windows, endocardium is difficult to see Lateral wall appears mildly hypokinetic. Would consider limited echo with Definity to help delineate. . Left ventricular ejection fraction, by estimation, is 50 to 55%. The left ventricle has low normal function. Left ventricular diastolic parameters are consistent with Grade I diastolic dysfunction (impaired relaxation). 2. Right ventricular systolic function is low normal. The right ventricular size is normal. 3. Trivial mitral valve regurgitation. 4. The inferior vena cava is dilated in size with <50% respiratory variability, suggesting right atrial pressure of 15 mmHg.  FINDINGS Left Ventricle: Diffiicult acoustic windows, endocardium is difficult to see Lateral wall appears mildly hypokinetic. Would consider limited echo with Definity to help delineate. Left ventricular ejection fraction, by estimation, is 50 to 55%. The left ventricle has low normal function. The left ventricular internal cavity size was normal in size. There is no left ventricular hypertrophy. Left ventricular diastolic parameters are consistent with Grade I diastolic dysfunction (impaired relaxation).  Right Ventricle: The right ventricular size is normal. Right ventricular systolic function is low normal.  Pericardium: There is no evidence of pericardial effusion.  Mitral Valve: There is mild thickening of the mitral valve leaflet(s). Mild mitral  annular calcification. Trivial mitral valve regurgitation.  Tricuspid Valve: The tricuspid valve is normal in structure. Tricuspid valve regurgitation is trivial.  Pulmonic Valve: The pulmonic valve was normal in structure. Pulmonic valve regurgitation is mild.  Venous: The inferior vena cava is dilated in size with less than 50% respiratory variability, suggesting right atrial pressure of 15 mmHg.  Additional Comments: Spectral Doppler performed. Color Doppler performed.  LEFT VENTRICLE PLAX 2D LVIDd:         5.30 cm      Diastology LVIDs:         3.90 cm      LV e' medial:    4.57 cm/s LV PW:         1.00 cm      LV E/e' medial:  20.7 LV IVS:        0.90 cm      LV e' lateral:   5.77 cm/s LVOT diam:     2.00 cm      LV E/e' lateral: 16.4 LV SV:         58 LV SV Index:   27 LVOT Area:     3.14 cm  LV Volumes (MOD) LV vol d, MOD A4C: 109.0 ml LV vol s, MOD A4C: 60.4 ml LV SV MOD A4C:     109.0 ml  IVC IVC diam: 2.00 cm  LEFT ATRIUM         Index LA diam:    3.80 cm 1.77 cm/m AORTIC VALVE LVOT Vmax:   86.20 cm/s LVOT Vmean:  57.300 cm/s LVOT VTI:    0.186 m  AORTA Ao Asc diam: 3.00 cm  MITRAL VALVE MV Area (PHT): 4.24 cm    SHUNTS MV Decel Time: 179 msec    Systemic VTI:  0.19 m MV E velocity: 94.50 cm/s  Systemic Diam: 2.00 cm MV A velocity: 91.90 cm/s MV E/A ratio:  1.03  Dietrich Pates MD Electronically signed by Dietrich Pates MD Signature Date/Time: 03/17/2022/9:09:39 AM    Final    MONITORS  LONG TERM MONITOR (3-14 DAYS) 01/17/2022  Narrative   Normal sinus rhythm with avg HR 77 bpm   Rare atrial tachycardia   Rare PAC's. Frequent PVC's 8.3%.   No atrial fibrillation   Patch Wear Time:  3 days and 1 hours (2023-10-24T12:33:48-0400 to 2023-10-27T13:52:54-0400)  Patient had a min HR of 49 bpm, max HR of 203 bpm, and avg HR of 77 bpm. Predominant underlying rhythm was Sinus Rhythm. 2 Ventricular Tachycardia runs occurred, the run with the fastest  interval lasting 4 beats with a max rate of 167 bpm (avg 123 bpm); the run with the fastest interval was also the longest. 13 Supraventricular Tachycardia runs occurred, the run with the fastest interval lasting 11 beats with a max rate of 203 bpm, the longest lasting 9.8 secs with an avg rate of 128 bpm. Isolated SVEs were rare (<1.0%), SVE Couplets were rare (<1.0%), and SVE Triplets were rare (<1.0%). Isolated VEs were frequent (8.3%, 27561), VE Couplets were occasional (3.1%, 5082), and VE Triplets were rare (<1.0%, 115). Ventricular Bigeminy and Trigeminy were present.            EKG:  no new  Recent Labs: 09/14/2021: TSH 0.829 03/17/2022: ALT 31 03/20/2022: B Natriuretic Peptide 778.5 03/21/2022: BUN 16; Creatinine, Ser 1.60; Hemoglobin 13.1; Magnesium 1.8; Platelets 235; Potassium 3.9; Sodium 138  Recent Lipid Panel    Component Value Date/Time   CHOL 87 03/18/2022 0412   CHOL 134 01/26/2021 1012   TRIG 172 (H) 03/18/2022 0412   HDL 20 (L) 03/18/2022 0412   HDL 28 (L) 01/26/2021 1012   CHOLHDL 4.4 03/18/2022 0412   VLDL 34 03/18/2022 0412   LDLCALC 33 03/18/2022 0412   LDLCALC 76 01/26/2021 1012     Risk Assessment/Calculations:         STOP-Bang Score:  6       Physical Exam:    VS:  BP 120/60 (BP Location: Left Arm, Patient Position: Sitting, Cuff Size: Normal)   Pulse (!) 55   Ht 5\' 10"  (1.778 m)   Wt 211 lb (95.7 kg)   BMI 30.28 kg/m     Wt Readings from Last 3 Encounters:  06/17/22 211 lb (95.7 kg)  04/26/22 219 lb (99.3 kg)  03/20/22 237 lb 14.4 oz (107.9 kg)     GEN:  Well nourished, well developed in no acute distress HEENT: Normal NECK: No JVD; No carotid bruits LYMPHATICS: No lymphadenopathy CARDIAC: RRR, no murmurs, rubs, gallops RESPIRATORY:  Clear to auscultation without rales, wheezing or rhonchi  ABDOMEN: Soft, non-tender, non-distended MUSCULOSKELETAL: 2+ lower extremity bilateral left greater than right edema; No deformity  SKIN: Warm  and dry NEUROLOGIC:  Alert and oriented x 3 PSYCHIATRIC:  Normal affect   ASSESSMENT:    1. Coronary artery disease involving native heart without angina pectoris, unspecified vessel or lesion type   2. Essential hypertension   3. OSA (obstructive sleep apnea)   4. Hyperlipidemia LDL goal <70    PLAN:    In order of problems listed above:  Coronary artery disease - Last catheterization resulted in 3 stents [facility] at [facility].  He had another catheterization at outside hospital that resulted in medical management.  Continue with dual antiplatelet therapy.  He is still feeling some shortness of breath as well as chest pain.  3 months after the stents were placed he started  to feel worse again.  Continue with aggressive medical management.  Hyperlipidemia - On Zetia as well as Crestor.  LDL 33.  Excellent.  Aggressive.  Sleep study - Still awaiting secondary portion of sleep study.  Has not heard from our sleep department.  Prior history of GI bleed, 2 bleeding polyps removed at 1 point.  2 iron infusions.  CBC currently stable.  Lower extremity edema - Encourage Lasix 80 mg twice a day for the next 3 days  Go back to Lasix 80 mg once a day after that.  45 minutes spent with review of medical records extensive history, discussion with patient.          Medication Adjustments/Labs and Tests Ordered: Current medicines are reviewed at length with the patient today.  Concerns regarding medicines are outlined above.  No orders of the defined types were placed in this encounter.  No orders of the defined types were placed in this encounter.   Patient Instructions  Medication Instructions:  The current medical regimen is effective;  continue present plan and medications.  *If you need a refill on your cardiac medications before your next appointment, please call your pharmacy*  Follow-Up: At [facility], you and your health needs are our priority.  As part of our  continuing mission to provide you with exceptional heart care, we have created designated Provider Care Teams.  These Care Teams include your primary Cardiologist (physician) and Advanced Practice Providers (APPs -  Physician Assistants and Nurse Practitioners) who all work together to provide you with the care you need, when you need it.  We recommend signing up for the patient portal called "MyChart".  Sign up information is provided on this After Visit Summary.  MyChart is used to connect with patients for Virtual Visits (Telemedicine).  Patients are able to view lab/test results, encounter notes, upcoming appointments, etc.  Non-urgent messages can be sent to your provider as well.   To learn more about what you can do with MyChart, go to [email or web address].    Your next appointment:   6 month(s)  Provider:   Jari Favre, PA-C      Then, Donato Schultz, MD will plan to see you again in 1 year(s).       Signed, Donato Schultz, MD  06/17/2022 12:12 PM    [facility]

## 2022-06-17 NOTE — Addendum Note (Signed)
Addended by: Reesa Chew on: 06/17/2022 12:14 PM   Modules accepted: Orders

## 2022-06-17 NOTE — Patient Instructions (Signed)
Medication Instructions:  The current medical regimen is effective;  continue present plan and medications.  *If you need a refill on your cardiac medications before your next appointment, please call your pharmacy*  Follow-Up: At [facility], you and your health needs are our priority.  As part of our continuing mission to provide you with exceptional heart care, we have created designated Provider Care Teams.  These Care Teams include your primary Cardiologist (physician) and Advanced Practice Providers (APPs -  Physician Assistants and Nurse Practitioners) who all work together to provide you with the care you need, when you need it.  We recommend signing up for the patient portal called "MyChart".  Sign up information is provided on this After Visit Summary.  MyChart is used to connect with patients for Virtual Visits (Telemedicine).  Patients are able to view lab/test results, encounter notes, upcoming appointments, etc.  Non-urgent messages can be sent to your provider as well.   To learn more about what you can do with MyChart, go to [email or web address].    Your next appointment:   6 month(s)  Provider:   Jari Favre, PA-C      Then, Donato Schultz, MD will plan to see you again in 1 year(s).

## 2022-06-24 ENCOUNTER — Ambulatory Visit
Admission: RE | Admit: 2022-06-24 | Discharge: 2022-06-24 | Disposition: A | Discharge status: Home / Self Care | Payer: Medicare HMO | Source: Ambulatory Visit | Attending: Physician Assistant | Admitting: Physician Assistant

## 2022-06-24 DIAGNOSIS — M4326 Fusion of spine, lumbar region: Secondary | ICD-10-CM

## 2022-06-24 DIAGNOSIS — R29898 Other symptoms and signs involving the musculoskeletal system: Secondary | ICD-10-CM | POA: Diagnosis not present

## 2022-06-24 DIAGNOSIS — M4316 Spondylolisthesis, lumbar region: Secondary | ICD-10-CM | POA: Diagnosis not present

## 2022-06-24 DIAGNOSIS — M48061 Spinal stenosis, lumbar region without neurogenic claudication: Secondary | ICD-10-CM | POA: Diagnosis not present

## 2022-06-24 DIAGNOSIS — M545 Low back pain, unspecified: Secondary | ICD-10-CM | POA: Diagnosis not present

## 2022-06-29 DIAGNOSIS — E11610 Type 2 diabetes mellitus with diabetic neuropathic arthropathy: Secondary | ICD-10-CM | POA: Diagnosis not present

## 2022-06-29 DIAGNOSIS — R600 Localized edema: Secondary | ICD-10-CM | POA: Diagnosis not present

## 2022-07-01 DIAGNOSIS — M5489 Other dorsalgia: Secondary | ICD-10-CM | POA: Diagnosis not present

## 2022-07-01 DIAGNOSIS — M4326 Fusion of spine, lumbar region: Secondary | ICD-10-CM | POA: Diagnosis not present

## 2022-07-05 DIAGNOSIS — E11610 Type 2 diabetes mellitus with diabetic neuropathic arthropathy: Secondary | ICD-10-CM | POA: Diagnosis not present

## 2022-07-05 DIAGNOSIS — Z8631 Personal history of diabetic foot ulcer: Secondary | ICD-10-CM | POA: Diagnosis not present

## 2022-07-05 DIAGNOSIS — Z1331 Encounter for screening for depression: Secondary | ICD-10-CM | POA: Diagnosis not present

## 2022-07-07 DIAGNOSIS — J40 Bronchitis, not specified as acute or chronic: Secondary | ICD-10-CM | POA: Diagnosis not present

## 2022-07-07 DIAGNOSIS — R058 Other specified cough: Secondary | ICD-10-CM | POA: Diagnosis not present

## 2022-07-12 ENCOUNTER — Encounter: Payer: Self-pay | Admitting: Internal Medicine

## 2022-07-12 ENCOUNTER — Telehealth: Payer: Self-pay

## 2022-07-12 ENCOUNTER — Other Ambulatory Visit (INDEPENDENT_AMBULATORY_CARE_PROVIDER_SITE_OTHER): Payer: Medicare HMO

## 2022-07-12 ENCOUNTER — Ambulatory Visit: Payer: Medicare HMO | Admitting: Internal Medicine

## 2022-07-12 VITALS — BP 136/70 | HR 63 | Ht 70.0 in | Wt 213.0 lb

## 2022-07-12 DIAGNOSIS — Z9861 Coronary angioplasty status: Secondary | ICD-10-CM | POA: Diagnosis not present

## 2022-07-12 DIAGNOSIS — R195 Other fecal abnormalities: Secondary | ICD-10-CM | POA: Diagnosis not present

## 2022-07-12 DIAGNOSIS — D508 Other iron deficiency anemias: Secondary | ICD-10-CM

## 2022-07-12 DIAGNOSIS — Z7902 Long term (current) use of antithrombotics/antiplatelets: Secondary | ICD-10-CM | POA: Diagnosis not present

## 2022-07-12 LAB — CBC WITH DIFFERENTIAL/PLATELET
Basophils Absolute: 0 10*3/uL (ref 0.0–0.1)
Basophils Relative: 0.4 % (ref 0.0–3.0)
Eosinophils Absolute: 0.1 10*3/uL (ref 0.0–0.7)
Eosinophils Relative: 0.6 % (ref 0.0–5.0)
HCT: 41.4 % (ref 39.0–52.0)
Hemoglobin: 14 g/dL (ref 13.0–17.0)
Lymphocytes Relative: 8.3 % — ABNORMAL LOW (ref 12.0–46.0)
Lymphs Abs: 0.8 10*3/uL (ref 0.7–4.0)
MCHC: 33.9 g/dL (ref 30.0–36.0)
MCV: 94.3 fl (ref 78.0–100.0)
Monocytes Absolute: 0.6 10*3/uL (ref 0.1–1.0)
Monocytes Relative: 5.5 % (ref 3.0–12.0)
Neutro Abs: 8.6 10*3/uL — ABNORMAL HIGH (ref 1.4–7.7)
Neutrophils Relative %: 85.2 % — ABNORMAL HIGH (ref 43.0–77.0)
Platelets: 342 10*3/uL (ref 150.0–400.0)
RBC: 4.39 Mil/uL (ref 4.22–5.81)
RDW: 14 % (ref 11.5–15.5)
WBC: 10.1 10*3/uL (ref 4.0–10.5)

## 2022-07-12 LAB — FERRITIN: Ferritin: 120.5 ng/mL (ref 22.0–322.0)

## 2022-07-12 NOTE — Telephone Encounter (Signed)
[facility] Pre-operative Risk Assessment     Request for surgical clearance:     Endoscopy Procedure  What type of surgery is being performed?     ECL  When is this surgery scheduled?     09/01/2022  What type of clearance is required ?   Pharmacy  Are there any medications that need to be held prior to surgery and how long? Plavix, 5 days  Practice name and name of physician performing surgery?      [facility]  What is your office phone and fax number?      Phone- [phone]  Fax- [phone]  Anesthesia type (None, local, MAC, general) ?       MAC

## 2022-07-12 NOTE — Telephone Encounter (Signed)
Dr. Anne Fu,  You saw this patient on 06/17/2022. Will you please comment on medical clearance for endoscopy? Request to hold Plavix for 5 days prior.   Please route your response to [facility] Preop. I will communicate with requesting office once you have given recommendations.   Thank you!  Carlos Levering, NP

## 2022-07-12 NOTE — Progress Notes (Signed)
[name] 76 y.o. 1946/02/01 [id number]  Assessment & Plan:   Encounter Diagnoses  Name Primary?   Other iron deficiency anemia Yes   Heme + stool    Long term current use of antithrombotics/antiplatelets    S/P PTCA (percutaneous transluminal coronary angioplasty)    EGD and colonoscopy scheduled at the hospital.  Lab Results  Component Value Date   WBC 10.1 07/12/2022   HGB 14.0 07/12/2022   HCT 41.4 07/12/2022   MCV 94.3 07/12/2022   PLT 342.0 07/12/2022    Lab Results  Component Value Date   FERRITIN 120.5 07/12/2022   Hold Plavix 5 days prior clarify with cardiology.    The risks and benefits as well as alternatives of endoscopic procedure(s) have been discussed and reviewed. All questions answered. The patient agrees to proceed.   Subjective:   Chief Complaint: Iron deficiency anemia schedule procedures  HPI 77 year old white man with type 2 diabetes mellitus, obesity, obstructive sleep apnea, CAD status post PTCA on clopidogrel, peripheral vascular disease, hypertension and chronic kidney disease who is here because of recurrent iron deficiency anemia and blood in the stool.  I saw him in November 2023, and we had planned to perform EGD and colonoscopy but then he had other issues and he was ill with pneumonia and sepsis in January in the setting of cellulitis.  He is here for follow-up to arrange procedures.  He is feeling somewhat back to normal overall.  He is not on oxygen he still prefers to have his procedures at the hospital due to the safety net and I think that is reasonable.  No obvious bleeding or due to change in bowel habits.  He has been supplementing with iron.  His hemoglobin in January was 13.1.  His ferritin was low last year and has not been rechecked.  He saw Dr. Anne Fu in the April and I have reviewed that note.      Additional information from last year summarized as follows: He was hospitalized several times this year, he had back surgery  he had pneumonia andAcute kidney injury with hospitalizations in May July and August.  Because of recurrent iron deficiency anemia he had iron infusions x2 at [facility] he tells me.  He has not noted any bleeding other than some dark stools 2 or 3 times lately.  No Pepto-Bismol.  No significant bowel habit changes other than some transient increased gastrocolic reflex this summer but his wife pointed out that was after multiple large doses of antibiotics.   He had coronary artery stenting again in December 2022.  He follows with Dr. Anne Fu but did get a second opinion regarding dyspnea and weakness from those [facility] cardiologist who thought it was because of his anemia, his hemoglobin was 9.  Ferritin was 28 in July.  He is not having dysphagia or abdominal pain.  He does "hear a dripping in his stomach like when he had polyp problems in 2021".   Other review of systems notable for undergoing foot debridements to avoid amputation, the heel foot infection question osteomyelitis.  This was done in [place].  He is quite pleased with that result.   Colonoscopy 01/03/20 - A tattoo was seen in the transverse colon. A post-polypectomy scar was found at the tattoo site. - The examination was otherwise normal on direct and retroflexion views. - No specimens collected. EGD 01/03/20 - Two gastric polyps. Resected and retrieved. Clips (MR conditional) were placed.Hyperplastic polyps - Blood in the duodenal  bulb. - The examination was otherwise normal.   He does not have a reduced ejection fraction on echocardiogram and review of prior anesthesia notes shows no difficult intubation issues and he knows of no such problem.  He does not use oxygen. Allergies  Allergen Reactions   Statins Other (See Comments)    Aggression  Pt ok to take crestor 20mg  once a week   Hydrocortisone Nausea Only   Ranexa [Ranolazine] Other (See Comments)    Severe weakness/near syncope after 1 dose Hallucinations     Hydrocodone Nausea And Vomiting   Miralax [Polyethylene Glycol] Nausea And Vomiting   Z-Pak [Azithromycin] Other (See Comments)    Raises blood sugar   Zaroxolyn [Metolazone] Other (See Comments)    Drained, no energy   Current Meds  Medication Sig   acetaminophen (TYLENOL) 500 MG tablet Take 1,000 mg by mouth daily as needed for mild pain (pain).   albuterol (PROVENTIL) (2.5 MG/3ML) 0.083% nebulizer solution Take 3 mLs (2.5 mg total) by nebulization every 6 (six) hours as needed for wheezing or shortness of breath. Dx Code D86.0   aspirin EC 81 MG tablet Take 81 mg by mouth every evening.    clopidogrel (PLAVIX) 75 MG tablet Take 1 tablet (75 mg total) by mouth daily. Restart on 09/28/21   ezetimibe (ZETIA) 10 MG tablet Take 10 mg by mouth in the morning.   furosemide (LASIX) 80 MG tablet Take 1 tablet (80 mg total) by mouth daily as needed for edema (take Lasix 80 mg as needed if gains more than 3 pounds in 1 day or 5 pounds in 1 week).   insulin aspart protamine- aspart (NOVOLOG MIX 70/30) (70-30) 100 UNIT/ML injection Inject 0.4 mLs (40 Units total) into the skin with breakfast, with lunch, and with evening meal.   Iron, Ferrous Sulfate, 325 (65 Fe) MG TABS Take 325 mg by mouth daily.   levothyroxine (SYNTHROID) 75 MCG tablet Take 75 mcg by mouth in the morning.   metoprolol tartrate (LOPRESSOR) 25 MG tablet Take 0.5 tablets (12.5 mg total) by mouth 2 (two) times daily.   montelukast (SINGULAIR) 10 MG tablet Take 10 mg by mouth daily as needed (allergies).   potassium chloride (KLOR-CON M) 10 MEQ tablet Take 10 mEq by mouth in the morning.   predniSONE (DELTASONE) 10 MG tablet Take 10 mg by mouth in the morning. Continuous course.   RABEprazole (ACIPHEX) 20 MG tablet Take 20 mg by mouth daily as needed (GERD).   rosuvastatin (CRESTOR) 20 MG tablet Take 1 tablet (20 mg total) by mouth at bedtime. (Patient taking differently: Take 20 mg by mouth every Sunday.)   Past Medical History:   Diagnosis Date   Adrenal insufficiency (HCC)    Allergy    Anemia    Arthritis    feet    Broken foot 09/2019   had to wore a boot. Left    CKD (chronic kidney disease), stage III (HCC)    Coronary artery disease    has stents   Coronary atherosclerosis of native coronary artery    Proximal LAD, posterior lateral stent widely patent-10/12/11   Diabetes mellitus    insulin and pills   Hearing loss    wears hearing aids   History of blood transfusion 06/30/2016   [facility] - 2 units transfused   Hyperlipidemia    Hypertension    OSA (obstructive sleep apnea)    uses VPAC sleep study 2 years done through [facility]. Dr. Anne Fu arranged  study   PVD (peripheral vascular disease) (HCC)    Sarcoidosis    Sleep apnea    uses CPAP nightly   Thyroid disease    Type 2 diabetes mellitus (HCC)    Past Surgical History:  Procedure Laterality Date   APPENDECTOMY     CATARACT EXTRACTION  2011   bilat   CHOLECYSTECTOMY  01/25/2011   Procedure: LAPAROSCOPIC CHOLECYSTECTOMY WITH INTRAOPERATIVE CHOLANGIOGRAM;  Surgeon: Rulon Abide, DO;  Location: [facility] OR;  Service: General;  Laterality: N/A;   COLONOSCOPY     several   COLONOSCOPY WITH PROPOFOL N/A 01/03/2020   Procedure: COLONOSCOPY WITH PROPOFOL;  Surgeon: Iva Boop, MD;  Location: [facility] ENDOSCOPY;  Service: Endoscopy;  Laterality: N/A;   CORONARY ANGIOPLASTY     most recent 11/2009   CORONARY PRESSURE/FFR STUDY N/A 01/18/2021   Procedure: INTRAVASCULAR PRESSURE WIRE/FFR STUDY;  Surgeon: Tonny Bollman, MD;  Location: [facility] INVASIVE CV LAB;  Service: Cardiovascular;  Laterality: N/A;   CORONARY STENT INTERVENTION N/A 08/07/2018   Procedure: CORONARY STENT INTERVENTION;  Surgeon: Corky Crafts, MD;  Location: [facility] INVASIVE CV LAB;  Service: Cardiovascular;  Laterality: N/A;   CORONARY STENT INTERVENTION N/A 01/18/2021   Procedure: CORONARY STENT INTERVENTION;  Surgeon: Tonny Bollman, MD;  Location: [facility] INVASIVE CV LAB;  Service:  Cardiovascular;  Laterality: N/A;   CORONARY STENT PLACEMENT  2009   in LAD and side branch PTCA   ESOPHAGOGASTRODUODENOSCOPY (EGD) WITH PROPOFOL N/A 01/03/2020   Procedure: ESOPHAGOGASTRODUODENOSCOPY (EGD) WITH PROPOFOL;  Surgeon: Iva Boop, MD;  Location: [facility]COPY;  Service: Endoscopy;  Laterality: N/A;   HEMOSTASIS CLIP PLACEMENT  01/03/2020   Procedure: HEMOSTASIS CLIP PLACEMENT;  Surgeon: Iva Boop, MD;  Location: [facility] ENDOSCOPY;  Service: Endoscopy;;   INTERCOSTAL NERVE BLOCK  2011, 06/2016   x2. lumbar spine   LEFT HEART CATHETERIZATION WITH CORONARY ANGIOGRAM Bilateral 10/12/2011   Procedure: LEFT HEART CATHETERIZATION WITH CORONARY ANGIOGRAM;  Surgeon: Donato Schultz, MD;  Location: [facility] CATH LAB;  Service: Cardiovascular;  Laterality: Bilateral;   LEFT HEART CATHETERIZATION WITH CORONARY ANGIOGRAM N/A 04/01/2014   Procedure: LEFT HEART CATHETERIZATION WITH CORONARY ANGIOGRAM;  Surgeon: Donato Schultz, MD;  Location: [facility] CATH LAB;  Service: Cardiovascular;  Laterality: N/A;   POLYPECTOMY  01/03/2020   Procedure: POLYPECTOMY;  Surgeon: Iva Boop, MD;  Location: [facility] ENDOSCOPY;  Service: Endoscopy;;   RIGHT/LEFT HEART CATH AND CORONARY ANGIOGRAPHY N/A 08/07/2018   Procedure: RIGHT/LEFT HEART CATH AND CORONARY ANGIOGRAPHY;  Surgeon: Corky Crafts, MD;  Location: [facility] INVASIVE CV LAB;  Service: Cardiovascular;  Laterality: N/A;   RIGHT/LEFT HEART CATH AND CORONARY ANGIOGRAPHY N/A 12/25/2020   Procedure: RIGHT/LEFT HEART CATH AND CORONARY ANGIOGRAPHY;  Surgeon: Lyn Records, MD;  Location: [facility] INVASIVE CV LAB;  Service: Cardiovascular;  Laterality: N/A;   TRANSFORAMINAL LUMBAR INTERBODY FUSION W/ MIS 1 LEVEL N/A 09/24/2021   Procedure: LUMBAR FOUR-FIVE MINIMALLY INVASIVE (MIS) TRANSFORAMINAL LUMBAR INTERBODY FUSION (TLIF) WITH METRX;  Surgeon: Jadene Pierini, MD;  Location: [facility] OR;  Service: Neurosurgery;  Laterality: N/A;   Social History   Social History Narrative   Not on  file   family history includes Anesthesia problems in his sister; Asthma in his sister; Diabetes in his mother; Heart attack in his father; Kidney cancer in his mother; Kidney disease in his mother; Sarcoidosis in his niece and sister.   Review of Systems As per HPI  Objective:   Physical Exam BP 136/70   Pulse 63   Ht  5\' 10"  (1.778 m)   Wt 213 lb (96.6 kg)   SpO2 96%   BMI 30.56 kg/m  NAD, chron ill wm Lungs cta Cor NL S1s2 no rmg Abd soft

## 2022-07-12 NOTE — Patient Instructions (Signed)
Your provider has requested that you go to the basement level for lab work before leaving today. Press "B" on the elevator. The lab is located at the first door on the left as you exit the elevator.   You have been scheduled for an endoscopy and colonoscopy. Please follow the written instructions given to you at your visit today. Please pick up your prep supplies at the pharmacy within the next 1-3 days. If you use inhalers (even only as needed), please bring them with you on the day of your procedure.   I appreciate the opportunity to care for you. Carl Gessner, MD, FACG 

## 2022-07-13 NOTE — Telephone Encounter (Signed)
   Name: [name]  DOB: 01-Dec-1945  MRN: [id number]   Primary Cardiologist: Donato Schultz, MD  Chart reviewed as part of pre-operative protocol coverage. [name] was last seen on 06/13/2022 by Dr. Anne Fu.  He was stable at that time.  Therefore, based on ACC/AHA guidelines, the patient would be at acceptable risk for the planned procedure without further cardiovascular testing.   Per office protocol, he may hold Plavix for 5 days prior to procedure and should resume as soon as hemodynamically stable postoperatively.   I will route this recommendation to the requesting party via Epic fax function and remove from pre-op pool. Please call with questions.  Carlos Levering, NP 07/13/2022, 12:03 PM

## 2022-07-14 ENCOUNTER — Encounter (INDEPENDENT_AMBULATORY_CARE_PROVIDER_SITE_OTHER): Payer: Medicare HMO | Admitting: Ophthalmology

## 2022-07-18 NOTE — Telephone Encounter (Signed)
I left a detailed message to call me back to confirm he got my message about holding his plavix 5 days prior to procedure on 09/01/2022.

## 2022-07-21 DIAGNOSIS — E782 Mixed hyperlipidemia: Secondary | ICD-10-CM | POA: Diagnosis not present

## 2022-07-21 DIAGNOSIS — I2510 Atherosclerotic heart disease of native coronary artery without angina pectoris: Secondary | ICD-10-CM | POA: Diagnosis not present

## 2022-07-21 DIAGNOSIS — E039 Hypothyroidism, unspecified: Secondary | ICD-10-CM | POA: Diagnosis not present

## 2022-07-21 DIAGNOSIS — D860 Sarcoidosis of lung: Secondary | ICD-10-CM | POA: Diagnosis not present

## 2022-07-21 DIAGNOSIS — I110 Hypertensive heart disease with heart failure: Secondary | ICD-10-CM | POA: Diagnosis not present

## 2022-07-21 DIAGNOSIS — I5032 Chronic diastolic (congestive) heart failure: Secondary | ICD-10-CM | POA: Diagnosis not present

## 2022-07-21 DIAGNOSIS — Z23 Encounter for immunization: Secondary | ICD-10-CM | POA: Diagnosis not present

## 2022-07-21 DIAGNOSIS — E1142 Type 2 diabetes mellitus with diabetic polyneuropathy: Secondary | ICD-10-CM | POA: Diagnosis not present

## 2022-07-21 DIAGNOSIS — L98491 Non-pressure chronic ulcer of skin of other sites limited to breakdown of skin: Secondary | ICD-10-CM | POA: Diagnosis not present

## 2022-07-21 DIAGNOSIS — I10 Essential (primary) hypertension: Secondary | ICD-10-CM | POA: Diagnosis not present

## 2022-07-24 DIAGNOSIS — J40 Bronchitis, not specified as acute or chronic: Secondary | ICD-10-CM | POA: Diagnosis not present

## 2022-07-24 DIAGNOSIS — Z03818 Encounter for observation for suspected exposure to other biological agents ruled out: Secondary | ICD-10-CM | POA: Diagnosis not present

## 2022-07-24 DIAGNOSIS — R0609 Other forms of dyspnea: Secondary | ICD-10-CM | POA: Diagnosis not present

## 2022-07-24 DIAGNOSIS — R509 Fever, unspecified: Secondary | ICD-10-CM | POA: Diagnosis not present

## 2022-07-24 DIAGNOSIS — R051 Acute cough: Secondary | ICD-10-CM | POA: Diagnosis not present

## 2022-08-01 ENCOUNTER — Emergency Department (HOSPITAL_COMMUNITY): Payer: Medicare HMO

## 2022-08-01 ENCOUNTER — Other Ambulatory Visit: Payer: Self-pay

## 2022-08-01 ENCOUNTER — Inpatient Hospital Stay (HOSPITAL_COMMUNITY)
Admission: EM | Admit: 2022-08-01 | Discharge: 2022-08-05 | DRG: 193 | Disposition: A | Discharge status: Home / Self Care | Payer: Medicare HMO | Attending: Internal Medicine | Admitting: Internal Medicine

## 2022-08-01 ENCOUNTER — Encounter (HOSPITAL_COMMUNITY): Payer: Self-pay | Admitting: Emergency Medicine

## 2022-08-01 DIAGNOSIS — E039 Hypothyroidism, unspecified: Secondary | ICD-10-CM | POA: Diagnosis present

## 2022-08-01 DIAGNOSIS — I130 Hypertensive heart and chronic kidney disease with heart failure and stage 1 through stage 4 chronic kidney disease, or unspecified chronic kidney disease: Secondary | ICD-10-CM | POA: Diagnosis not present

## 2022-08-01 DIAGNOSIS — R062 Wheezing: Secondary | ICD-10-CM | POA: Diagnosis not present

## 2022-08-01 DIAGNOSIS — N1831 Chronic kidney disease, stage 3a: Secondary | ICD-10-CM | POA: Diagnosis present

## 2022-08-01 DIAGNOSIS — E785 Hyperlipidemia, unspecified: Secondary | ICD-10-CM | POA: Diagnosis present

## 2022-08-01 DIAGNOSIS — D84821 Immunodeficiency due to drugs: Secondary | ICD-10-CM | POA: Diagnosis not present

## 2022-08-01 DIAGNOSIS — Z833 Family history of diabetes mellitus: Secondary | ICD-10-CM

## 2022-08-01 DIAGNOSIS — E1122 Type 2 diabetes mellitus with diabetic chronic kidney disease: Secondary | ICD-10-CM | POA: Diagnosis not present

## 2022-08-01 DIAGNOSIS — Z7952 Long term (current) use of systemic steroids: Secondary | ICD-10-CM

## 2022-08-01 DIAGNOSIS — D869 Sarcoidosis, unspecified: Secondary | ICD-10-CM | POA: Diagnosis not present

## 2022-08-01 DIAGNOSIS — Z7902 Long term (current) use of antithrombotics/antiplatelets: Secondary | ICD-10-CM

## 2022-08-01 DIAGNOSIS — H9190 Unspecified hearing loss, unspecified ear: Secondary | ICD-10-CM | POA: Diagnosis present

## 2022-08-01 DIAGNOSIS — Z974 Presence of external hearing-aid: Secondary | ICD-10-CM

## 2022-08-01 DIAGNOSIS — K219 Gastro-esophageal reflux disease without esophagitis: Secondary | ICD-10-CM | POA: Diagnosis present

## 2022-08-01 DIAGNOSIS — E871 Hypo-osmolality and hyponatremia: Secondary | ICD-10-CM | POA: Diagnosis present

## 2022-08-01 DIAGNOSIS — E876 Hypokalemia: Secondary | ICD-10-CM | POA: Diagnosis present

## 2022-08-01 DIAGNOSIS — Z794 Long term (current) use of insulin: Secondary | ICD-10-CM | POA: Diagnosis not present

## 2022-08-01 DIAGNOSIS — I25118 Atherosclerotic heart disease of native coronary artery with other forms of angina pectoris: Secondary | ICD-10-CM | POA: Diagnosis not present

## 2022-08-01 DIAGNOSIS — Z8051 Family history of malignant neoplasm of kidney: Secondary | ICD-10-CM

## 2022-08-01 DIAGNOSIS — Z1152 Encounter for screening for COVID-19: Secondary | ICD-10-CM

## 2022-08-01 DIAGNOSIS — E1165 Type 2 diabetes mellitus with hyperglycemia: Secondary | ICD-10-CM | POA: Diagnosis present

## 2022-08-01 DIAGNOSIS — Z9049 Acquired absence of other specified parts of digestive tract: Secondary | ICD-10-CM

## 2022-08-01 DIAGNOSIS — J9601 Acute respiratory failure with hypoxia: Secondary | ICD-10-CM | POA: Diagnosis not present

## 2022-08-01 DIAGNOSIS — Z79899 Other long term (current) drug therapy: Secondary | ICD-10-CM

## 2022-08-01 DIAGNOSIS — I25119 Atherosclerotic heart disease of native coronary artery with unspecified angina pectoris: Secondary | ICD-10-CM | POA: Diagnosis present

## 2022-08-01 DIAGNOSIS — J189 Pneumonia, unspecified organism: Secondary | ICD-10-CM | POA: Diagnosis not present

## 2022-08-01 DIAGNOSIS — Z825 Family history of asthma and other chronic lower respiratory diseases: Secondary | ICD-10-CM

## 2022-08-01 DIAGNOSIS — Z8249 Family history of ischemic heart disease and other diseases of the circulatory system: Secondary | ICD-10-CM

## 2022-08-01 DIAGNOSIS — R0789 Other chest pain: Secondary | ICD-10-CM | POA: Diagnosis not present

## 2022-08-01 DIAGNOSIS — D860 Sarcoidosis of lung: Secondary | ICD-10-CM | POA: Diagnosis present

## 2022-08-01 DIAGNOSIS — E1151 Type 2 diabetes mellitus with diabetic peripheral angiopathy without gangrene: Secondary | ICD-10-CM | POA: Diagnosis present

## 2022-08-01 DIAGNOSIS — R7989 Other specified abnormal findings of blood chemistry: Secondary | ICD-10-CM | POA: Diagnosis not present

## 2022-08-01 DIAGNOSIS — G4733 Obstructive sleep apnea (adult) (pediatric): Secondary | ICD-10-CM | POA: Diagnosis present

## 2022-08-01 DIAGNOSIS — Z888 Allergy status to other drugs, medicaments and biological substances status: Secondary | ICD-10-CM

## 2022-08-01 DIAGNOSIS — Z7982 Long term (current) use of aspirin: Secondary | ICD-10-CM

## 2022-08-01 DIAGNOSIS — R051 Acute cough: Secondary | ICD-10-CM | POA: Diagnosis not present

## 2022-08-01 DIAGNOSIS — Z955 Presence of coronary angioplasty implant and graft: Secondary | ICD-10-CM

## 2022-08-01 DIAGNOSIS — R079 Chest pain, unspecified: Secondary | ICD-10-CM | POA: Diagnosis not present

## 2022-08-01 DIAGNOSIS — Z841 Family history of disorders of kidney and ureter: Secondary | ICD-10-CM

## 2022-08-01 DIAGNOSIS — Z8639 Personal history of other endocrine, nutritional and metabolic disease: Secondary | ICD-10-CM

## 2022-08-01 DIAGNOSIS — I2489 Other forms of acute ischemic heart disease: Secondary | ICD-10-CM | POA: Diagnosis present

## 2022-08-01 DIAGNOSIS — Z9989 Dependence on other enabling machines and devices: Secondary | ICD-10-CM

## 2022-08-01 DIAGNOSIS — I5032 Chronic diastolic (congestive) heart failure: Secondary | ICD-10-CM | POA: Diagnosis present

## 2022-08-01 DIAGNOSIS — R0602 Shortness of breath: Secondary | ICD-10-CM | POA: Diagnosis not present

## 2022-08-01 DIAGNOSIS — R059 Cough, unspecified: Secondary | ICD-10-CM | POA: Diagnosis not present

## 2022-08-01 DIAGNOSIS — Z881 Allergy status to other antibiotic agents status: Secondary | ICD-10-CM

## 2022-08-01 DIAGNOSIS — Z8701 Personal history of pneumonia (recurrent): Secondary | ICD-10-CM

## 2022-08-01 DIAGNOSIS — Z79890 Hormone replacement therapy (postmenopausal): Secondary | ICD-10-CM

## 2022-08-01 LAB — BASIC METABOLIC PANEL
Anion gap: 13 (ref 5–15)
BUN: 14 mg/dL (ref 8–23)
CO2: 25 mmol/L (ref 22–32)
Calcium: 8.3 mg/dL — ABNORMAL LOW (ref 8.9–10.3)
Chloride: 96 mmol/L — ABNORMAL LOW (ref 98–111)
Creatinine, Ser: 1.01 mg/dL (ref 0.61–1.24)
GFR, Estimated: >60 mL/min (ref >60)
Glucose, Bld: 194 mg/dL — ABNORMAL HIGH (ref 70–99)
Potassium: 3.4 mmol/L — ABNORMAL LOW (ref 3.5–5.1)
Sodium: 134 mmol/L — ABNORMAL LOW (ref 135–145)

## 2022-08-01 LAB — CBC
HCT: 47.3 % (ref 39.0–52.0)
Hemoglobin: 15.1 g/dL (ref 13.0–17.0)
MCH: 31.3 pg (ref 26.0–34.0)
MCHC: 31.9 g/dL (ref 30.0–36.0)
MCV: 97.9 fL (ref 80.0–100.0)
Platelets: 246 10*3/uL (ref 150–400)
RBC: 4.83 MIL/uL (ref 4.22–5.81)
RDW: 14 % (ref 11.5–15.5)
WBC: 13.2 10*3/uL — ABNORMAL HIGH (ref 4.0–10.5)
nRBC: 0 % (ref 0.0–0.2)

## 2022-08-01 LAB — TROPONIN I (HIGH SENSITIVITY): Troponin I (High Sensitivity): 104 ng/L (ref <18)

## 2022-08-01 NOTE — ED Provider Notes (Incomplete)
[place] EMERGENCY DEPARTMENT AT [facility] Provider Note   CSN: [id number] Arrival date & time: 08/01/22  2058     History {Add pertinent medical, surgical, social history, OB history to HPI:1} Chief Complaint  Patient presents with   Cough   Chest Pain    [name] is a 77 y.o. male.  Patient presents to the emergency department for worsening chest pain.  Patient has not been feeling well for a couple of weeks.  He has been seen by his primary doctor and at urgent care.  He was told that he has a pneumonia and is on his second antibiotic for this.  Patient with increased shortness of breath and tightness of his lungs with anterior chest pain tonight.  He reports similar symptoms with recurrent pneumonia in the past.       Home Medications Prior to Admission medications   Medication Sig Start Date End Date Taking? Authorizing Provider  acetaminophen (TYLENOL) 500 MG tablet Take 1,000 mg by mouth daily as needed for mild pain (pain).    [provider]  albuterol (PROVENTIL) (2.5 MG/3ML) 0.083% nebulizer solution Take 3 mLs (2.5 mg total) by nebulization every 6 (six) hours as needed for wheezing or shortness of breath. Dx Code D86.0 07/24/14   Storm Frisk, MD  aspirin EC 81 MG tablet Take 81 mg by mouth every evening.     [provider]  clopidogrel (PLAVIX) 75 MG tablet Take 1 tablet (75 mg total) by mouth daily. Restart on 09/28/21 09/25/21   Maurilio Lovely D, DO  ezetimibe (ZETIA) 10 MG tablet Take 10 mg by mouth in the morning. 06/25/21   [provider]  furosemide (LASIX) 80 MG tablet Take 1 tablet (80 mg total) by mouth daily as needed for edema (take Lasix 80 mg as needed if gains more than 3 pounds in 1 day or 5 pounds in 1 week). 10/21/21   Lanae Boast, MD  hydrALAZINE (APRESOLINE) 25 MG tablet Take 1 tablet (25 mg total) by mouth 3 (three) times daily. 03/21/22 06/17/22  Amin, Loura Halt, MD  insulin aspart protamine- aspart  (NOVOLOG MIX 70/30) (70-30) 100 UNIT/ML injection Inject 0.4 mLs (40 Units total) into the skin with breakfast, with lunch, and with evening meal. 09/16/21   Joseph Art, DO  Iron, Ferrous Sulfate, 325 (65 Fe) MG TABS Take 325 mg by mouth daily. 01/18/22   Iva Boop, MD  levothyroxine (SYNTHROID) 75 MCG tablet Take 75 mcg by mouth in the morning. 02/24/21   [provider]  metoprolol tartrate (LOPRESSOR) 25 MG tablet Take 0.5 tablets (12.5 mg total) by mouth 2 (two) times daily. 02/03/22   Sharlene Dory, PA-C  montelukast (SINGULAIR) 10 MG tablet Take 10 mg by mouth daily as needed (allergies). 07/18/18   [provider]  nitroGLYCERIN (NITROSTAT) 0.4 MG SL tablet Place 1 tablet (0.4 mg total) under the tongue every 5 (five) minutes as needed for chest pain. 07/01/20 06/17/22  Dyann Kief, PA-C  potassium chloride (KLOR-CON M) 10 MEQ tablet Take 10 mEq by mouth in the morning.    [provider]  predniSONE (DELTASONE) 10 MG tablet Take 10 mg by mouth in the morning. Continuous course.    [provider]  RABEprazole (ACIPHEX) 20 MG tablet Take 20 mg by mouth daily as needed (GERD). 09/28/21   [provider]  rosuvastatin (CRESTOR) 20 MG tablet Take 1 tablet (20 mg total) by mouth at  bedtime. Patient taking differently: Take 20 mg by mouth every Sunday. 02/10/22   Sharlene Dory, PA-C      Allergies    Statins, Hydrocortisone, Ranexa [ranolazine], Hydrocodone, Miralax [polyethylene glycol], Z-pak [azithromycin], and Zaroxolyn [metolazone]    Review of Systems   Review of Systems  Physical Exam Updated Vital Signs BP 118/69 (BP Location: Right Arm)   Pulse 95   Temp 98.4 F (36.9 C) (Oral)   Resp 20   Wt 96.6 kg   SpO2 (!) 89%   BMI 30.56 kg/m  Physical Exam Vitals and nursing note reviewed.  Constitutional:      General: He is not in acute distress.    Appearance: He is well-developed.  HENT:     Head: Normocephalic and  atraumatic.     Mouth/Throat:     Mouth: Mucous membranes are moist.  Eyes:     General: Vision grossly intact. Gaze aligned appropriately.     Extraocular Movements: Extraocular movements intact.     Conjunctiva/sclera: Conjunctivae normal.  Cardiovascular:     Rate and Rhythm: Normal rate and regular rhythm.     Pulses: Normal pulses.     Heart sounds: Normal heart sounds, S1 normal and S2 normal. No murmur heard.    No friction rub. No gallop.  Pulmonary:     Effort: Pulmonary effort is normal. Tachypnea present. No respiratory distress.     Breath sounds: Decreased air movement present. Rhonchi present.  Abdominal:     Palpations: Abdomen is soft.     Tenderness: There is no abdominal tenderness. There is no guarding or rebound.     Hernia: No hernia is present.  Musculoskeletal:        General: No swelling.     Cervical back: Full passive range of motion without pain, normal range of motion and neck supple. No pain with movement, spinous process tenderness or muscular tenderness. Normal range of motion.     Right lower leg: No edema.     Left lower leg: No edema.  Skin:    General: Skin is warm and dry.     Capillary Refill: Capillary refill takes less than 2 seconds.     Findings: No ecchymosis, erythema, lesion or wound.  Neurological:     Mental Status: He is alert and oriented to person, place, and time.     GCS: GCS eye subscore is 4. GCS verbal subscore is 5. GCS motor subscore is 6.     Cranial Nerves: Cranial nerves 2-12 are intact.     Sensory: Sensation is intact.     Motor: Motor function is intact. No weakness or abnormal muscle tone.     Coordination: Coordination is intact.  Psychiatric:        Mood and Affect: Mood normal.        Speech: Speech normal.        Behavior: Behavior normal.     ED Results / Procedures / Treatments   Labs (all labs ordered are listed, but only abnormal results are displayed) Labs Reviewed  BASIC METABOLIC PANEL - Abnormal;  Notable for the following components:      Result Value   Sodium 134 (*)    Potassium 3.4 (*)    Chloride 96 (*)    Glucose, Bld 194 (*)    Calcium 8.3 (*)    All other components within normal limits  CBC - Abnormal; Notable for the following components:   WBC 13.2 (*)  All other components within normal limits  TROPONIN I (HIGH SENSITIVITY) - Abnormal; Notable for the following components:   Troponin I (High Sensitivity) 104 (*)    All other components within normal limits  CULTURE, BLOOD (ROUTINE X 2)  CULTURE, BLOOD (ROUTINE X 2)  DIFFERENTIAL  BRAIN NATRIURETIC PEPTIDE  LACTIC ACID, PLASMA  LACTIC ACID, PLASMA  TROPONIN I (HIGH SENSITIVITY)    EKG EKG Interpretation  Date/Time:  Monday Aug 01 2022 23:06:17 EDT Ventricular Rate:  92 PR Interval:  150 QRS Duration: 122 QT Interval:  388 QTC Calculation: 479 R Axis:   -73 Text Interpretation: Normal sinus rhythm Right bundle branch block Left anterior fascicular block *** Bifascicular block *** Minimal voltage criteria for LVH, may be normal variant ( R in aVL ) Abnormal ECG When compared with ECG of 15-Mar-2022 11:19, No significant change since last tracing Confirmed by Gilda Crease [phone]) on 08/01/2022 11:34:01 PM  Radiology DG Chest 2 View  Result Date: 08/01/2022 CLINICAL DATA:  cough, chest pain EXAM: CHEST - 2 VIEW COMPARISON:  Chest x-ray 07/24/2022 FINDINGS: The heart and mediastinal contours are unchanged. Aortic calcification. Persistent right lower lobe airspace opacity. Persistent left mid lung zone airspace opacity. No pulmonary edema. No pleural effusion. No pneumothorax. No acute osseous abnormality. IMPRESSION: 1. Persistent right lower lobe and mid left lung zone airspace opacity suggestive of infection. Followup PA and lateral chest X-ray is recommended in 3-4 weeks following therapy to ensure resolution and exclude underlying malignancy. 2.  Aortic Atherosclerosis (ICD10-I70.0). Electronically  Signed   By: Tish Frederickson M.D.   On: 08/01/2022 21:59    Procedures Procedures  {Document cardiac monitor, telemetry assessment procedure when appropriate:1}  Medications Ordered in ED Medications - No data to display  ED Course/ Medical Decision Making/ A&P   {   Click here for ABCD2, HEART and other calculatorsREFRESH Note before signing :1}                          Medical Decision Making Amount and/or Complexity of Data Reviewed Labs: ordered. Radiology: ordered.   ***  {Document critical care time when appropriate:1} {Document review of labs and clinical decision tools ie heart score, Chads2Vasc2 etc:1}  {Document your independent review of radiology images, and any outside records:1} {Document your discussion with family members, caretakers, and with consultants:1} {Document social determinants of health affecting pt's care:1} {Document your decision making why or why not admission, treatments were needed:1} Final Clinical Impression(s) / ED Diagnoses Final diagnoses:  None    Rx / DC Orders ED Discharge Orders     None

## 2022-08-01 NOTE — ED Triage Notes (Signed)
Pt in with worsened cough and chest tightness. Pt states 2 wks ago, his doctor placed him on abx for "lymphnode infection". Reporting fevers at home, frequent hx of PNA. Sats 89% on RA, placed on O2

## 2022-08-02 ENCOUNTER — Inpatient Hospital Stay (HOSPITAL_COMMUNITY): Payer: Medicare HMO

## 2022-08-02 ENCOUNTER — Emergency Department (HOSPITAL_COMMUNITY): Payer: Medicare HMO

## 2022-08-02 DIAGNOSIS — H9190 Unspecified hearing loss, unspecified ear: Secondary | ICD-10-CM | POA: Diagnosis present

## 2022-08-02 DIAGNOSIS — D84821 Immunodeficiency due to drugs: Secondary | ICD-10-CM | POA: Diagnosis present

## 2022-08-02 DIAGNOSIS — K219 Gastro-esophageal reflux disease without esophagitis: Secondary | ICD-10-CM | POA: Diagnosis present

## 2022-08-02 DIAGNOSIS — J9601 Acute respiratory failure with hypoxia: Secondary | ICD-10-CM | POA: Diagnosis present

## 2022-08-02 DIAGNOSIS — R7989 Other specified abnormal findings of blood chemistry: Secondary | ICD-10-CM | POA: Diagnosis not present

## 2022-08-02 DIAGNOSIS — Z955 Presence of coronary angioplasty implant and graft: Secondary | ICD-10-CM | POA: Diagnosis not present

## 2022-08-02 DIAGNOSIS — I130 Hypertensive heart and chronic kidney disease with heart failure and stage 1 through stage 4 chronic kidney disease, or unspecified chronic kidney disease: Secondary | ICD-10-CM | POA: Diagnosis present

## 2022-08-02 DIAGNOSIS — E785 Hyperlipidemia, unspecified: Secondary | ICD-10-CM | POA: Diagnosis present

## 2022-08-02 DIAGNOSIS — Z8639 Personal history of other endocrine, nutritional and metabolic disease: Secondary | ICD-10-CM | POA: Diagnosis not present

## 2022-08-02 DIAGNOSIS — N1831 Chronic kidney disease, stage 3a: Secondary | ICD-10-CM | POA: Diagnosis present

## 2022-08-02 DIAGNOSIS — E1151 Type 2 diabetes mellitus with diabetic peripheral angiopathy without gangrene: Secondary | ICD-10-CM | POA: Diagnosis present

## 2022-08-02 DIAGNOSIS — Z1152 Encounter for screening for COVID-19: Secondary | ICD-10-CM | POA: Diagnosis not present

## 2022-08-02 DIAGNOSIS — E1165 Type 2 diabetes mellitus with hyperglycemia: Secondary | ICD-10-CM | POA: Diagnosis present

## 2022-08-02 DIAGNOSIS — E876 Hypokalemia: Secondary | ICD-10-CM | POA: Diagnosis present

## 2022-08-02 DIAGNOSIS — D869 Sarcoidosis, unspecified: Secondary | ICD-10-CM | POA: Diagnosis not present

## 2022-08-02 DIAGNOSIS — G4733 Obstructive sleep apnea (adult) (pediatric): Secondary | ICD-10-CM | POA: Diagnosis present

## 2022-08-02 DIAGNOSIS — J189 Pneumonia, unspecified organism: Secondary | ICD-10-CM | POA: Diagnosis present

## 2022-08-02 DIAGNOSIS — Z7902 Long term (current) use of antithrombotics/antiplatelets: Secondary | ICD-10-CM | POA: Diagnosis not present

## 2022-08-02 DIAGNOSIS — D860 Sarcoidosis of lung: Secondary | ICD-10-CM | POA: Diagnosis present

## 2022-08-02 DIAGNOSIS — E1122 Type 2 diabetes mellitus with diabetic chronic kidney disease: Secondary | ICD-10-CM | POA: Diagnosis present

## 2022-08-02 DIAGNOSIS — I25119 Atherosclerotic heart disease of native coronary artery with unspecified angina pectoris: Secondary | ICD-10-CM | POA: Diagnosis present

## 2022-08-02 DIAGNOSIS — Z794 Long term (current) use of insulin: Secondary | ICD-10-CM | POA: Diagnosis not present

## 2022-08-02 DIAGNOSIS — E039 Hypothyroidism, unspecified: Secondary | ICD-10-CM | POA: Diagnosis present

## 2022-08-02 DIAGNOSIS — E871 Hypo-osmolality and hyponatremia: Secondary | ICD-10-CM | POA: Diagnosis present

## 2022-08-02 DIAGNOSIS — R059 Cough, unspecified: Secondary | ICD-10-CM | POA: Diagnosis not present

## 2022-08-02 DIAGNOSIS — I2489 Other forms of acute ischemic heart disease: Secondary | ICD-10-CM | POA: Diagnosis present

## 2022-08-02 DIAGNOSIS — I25118 Atherosclerotic heart disease of native coronary artery with other forms of angina pectoris: Secondary | ICD-10-CM | POA: Diagnosis not present

## 2022-08-02 DIAGNOSIS — R0789 Other chest pain: Secondary | ICD-10-CM | POA: Diagnosis not present

## 2022-08-02 DIAGNOSIS — I5032 Chronic diastolic (congestive) heart failure: Secondary | ICD-10-CM | POA: Diagnosis present

## 2022-08-02 LAB — MAGNESIUM: Magnesium: 1.7 mg/dL (ref 1.7–2.4)

## 2022-08-02 LAB — BASIC METABOLIC PANEL
Anion gap: 10 (ref 5–15)
BUN: 15 mg/dL (ref 8–23)
CO2: 28 mmol/L (ref 22–32)
Calcium: 7.2 mg/dL — ABNORMAL LOW (ref 8.9–10.3)
Chloride: 96 mmol/L — ABNORMAL LOW (ref 98–111)
Creatinine, Ser: 1.05 mg/dL (ref 0.61–1.24)
GFR, Estimated: >60 mL/min (ref >60)
Glucose, Bld: 213 mg/dL — ABNORMAL HIGH (ref 70–99)
Potassium: 3.7 mmol/L (ref 3.5–5.1)
Sodium: 134 mmol/L — ABNORMAL LOW (ref 135–145)

## 2022-08-02 LAB — DIFFERENTIAL
Abs Immature Granulocytes: 0.47 10*3/uL — ABNORMAL HIGH (ref 0.00–0.07)
Basophils Absolute: 0.1 10*3/uL (ref 0.0–0.1)
Basophils Relative: 1 %
Eosinophils Absolute: 0.2 10*3/uL (ref 0.0–0.5)
Eosinophils Relative: 1 %
Immature Granulocytes: 4 %
Lymphocytes Relative: 14 %
Lymphs Abs: 1.8 10*3/uL (ref 0.7–4.0)
Monocytes Absolute: 1.1 10*3/uL — ABNORMAL HIGH (ref 0.1–1.0)
Monocytes Relative: 8 %
Neutro Abs: 9.6 10*3/uL — ABNORMAL HIGH (ref 1.7–7.7)
Neutrophils Relative %: 72 %

## 2022-08-02 LAB — CULTURE, RESPIRATORY W GRAM STAIN

## 2022-08-02 LAB — EXPECTORATED SPUTUM ASSESSMENT W GRAM STAIN, RFLX TO RESP C: Special Requests: NORMAL

## 2022-08-02 LAB — ECHOCARDIOGRAM COMPLETE
AR max vel: 1.97 cm2
AV Peak grad: 12 mmHg
Ao pk vel: 1.73 m/s
Area-P 1/2: 3.08 cm2
S' Lateral: 4.5 cm
Weight: 3407.99 oz

## 2022-08-02 LAB — LACTIC ACID, PLASMA
Lactic Acid, Venous: 1.2 mmol/L (ref 0.5–1.9)
Lactic Acid, Venous: 1.3 mmol/L (ref 0.5–1.9)

## 2022-08-02 LAB — TROPONIN I (HIGH SENSITIVITY): Troponin I (High Sensitivity): 107 ng/L (ref <18)

## 2022-08-02 LAB — CBC
HCT: 40 % (ref 39.0–52.0)
Hemoglobin: 12.9 g/dL — ABNORMAL LOW (ref 13.0–17.0)
MCH: 31 pg (ref 26.0–34.0)
MCHC: 32.3 g/dL (ref 30.0–36.0)
MCV: 96.2 fL (ref 80.0–100.0)
Platelets: 197 10*3/uL (ref 150–400)
RBC: 4.16 MIL/uL — ABNORMAL LOW (ref 4.22–5.81)
RDW: 13.9 % (ref 11.5–15.5)
WBC: 10.7 10*3/uL — ABNORMAL HIGH (ref 4.0–10.5)
nRBC: 0 % (ref 0.0–0.2)

## 2022-08-02 LAB — GLUCOSE, CAPILLARY
Glucose-Capillary: 213 mg/dL — ABNORMAL HIGH (ref 70–99)
Glucose-Capillary: 350 mg/dL — ABNORMAL HIGH (ref 70–99)
Glucose-Capillary: 382 mg/dL — ABNORMAL HIGH (ref 70–99)
Glucose-Capillary: 400 mg/dL — ABNORMAL HIGH (ref 70–99)

## 2022-08-02 LAB — SARS CORONAVIRUS 2 BY RT PCR: SARS Coronavirus 2 by RT PCR: NEGATIVE

## 2022-08-02 LAB — PHOSPHORUS: Phosphorus: 2 mg/dL — ABNORMAL LOW (ref 2.5–4.6)

## 2022-08-02 LAB — PROCALCITONIN: Procalcitonin: <0.1 ng/mL

## 2022-08-02 LAB — BRAIN NATRIURETIC PEPTIDE: B Natriuretic Peptide: 332.4 pg/mL — ABNORMAL HIGH (ref 0.0–100.0)

## 2022-08-02 MED ORDER — ADULT MULTIVITAMIN W/MINERALS CH
1.0000 | ORAL_TABLET | Freq: Every day | ORAL | Status: DC
  Administered 2022-08-02 – 2022-08-04 (×3): 1 via ORAL
  Filled 2022-08-02 (×3): qty 1

## 2022-08-02 MED ORDER — ASPIRIN 81 MG PO TBEC
81.0000 mg | DELAYED_RELEASE_TABLET | Freq: Every evening | ORAL | Status: DC
  Administered 2022-08-02 – 2022-08-04 (×3): 81 mg via ORAL
  Filled 2022-08-02 (×3): qty 1

## 2022-08-02 MED ORDER — BANATROL TF EN LIQD
60.0000 mL | Freq: Two times a day (BID) | ENTERAL | Status: DC
  Administered 2022-08-02 – 2022-08-03 (×3): 60 mL via ORAL
  Filled 2022-08-02 (×6): qty 60

## 2022-08-02 MED ORDER — ACETAMINOPHEN 325 MG PO TABS: 650.0000 mg | ORAL_TABLET | Freq: Four times a day (QID) | ORAL | Status: DC | PRN

## 2022-08-02 MED ORDER — INSULIN ASPART 100 UNIT/ML IJ SOLN
0.0000 [IU] | Freq: Three times a day (TID) | INTRAMUSCULAR | Status: DC
  Administered 2022-08-02: 9 [IU] via SUBCUTANEOUS
  Administered 2022-08-02: 3 [IU] via SUBCUTANEOUS
  Administered 2022-08-02: 7 [IU] via SUBCUTANEOUS
  Administered 2022-08-03 (×2): 3 [IU] via SUBCUTANEOUS

## 2022-08-02 MED ORDER — CLOPIDOGREL BISULFATE 75 MG PO TABS
75.0000 mg | ORAL_TABLET | Freq: Every day | ORAL | Status: DC
  Administered 2022-08-02 – 2022-08-05 (×4): 75 mg via ORAL
  Filled 2022-08-02 (×4): qty 1

## 2022-08-02 MED ORDER — PREDNISONE 10 MG PO TABS
10.0000 mg | ORAL_TABLET | Freq: Every morning | ORAL | Status: DC
  Administered 2022-08-02 – 2022-08-05 (×4): 10 mg via ORAL
  Filled 2022-08-02 (×4): qty 1

## 2022-08-02 MED ORDER — SODIUM CHLORIDE 0.9 % IV SOLN: 2.0000 g | INTRAVENOUS | Status: DC

## 2022-08-02 MED ORDER — IOHEXOL 350 MG/ML SOLN
75.0000 mL | Freq: Once | INTRAVENOUS | Status: AC | PRN
  Administered 2022-08-02: 75 mL via INTRAVENOUS

## 2022-08-02 MED ORDER — EZETIMIBE 10 MG PO TABS
10.0000 mg | ORAL_TABLET | Freq: Every day | ORAL | Status: DC
  Administered 2022-08-02 – 2022-08-05 (×4): 10 mg via ORAL
  Filled 2022-08-02 (×4): qty 1

## 2022-08-02 MED ORDER — SODIUM CHLORIDE 0.9 % IV SOLN
2.0000 g | Freq: Once | INTRAVENOUS | Status: AC
  Administered 2022-08-02: 2 g via INTRAVENOUS
  Filled 2022-08-02: qty 12.5

## 2022-08-02 MED ORDER — GLUCERNA SHAKE PO LIQD
237.0000 mL | Freq: Three times a day (TID) | ORAL | Status: DC
  Administered 2022-08-02 – 2022-08-04 (×6): 237 mL via ORAL

## 2022-08-02 MED ORDER — ROSUVASTATIN CALCIUM 20 MG PO TABS
20.0000 mg | ORAL_TABLET | Freq: Every day | ORAL | Status: DC
  Administered 2022-08-02 – 2022-08-04 (×3): 20 mg via ORAL
  Filled 2022-08-02 (×3): qty 1

## 2022-08-02 MED ORDER — MONTELUKAST SODIUM 10 MG PO TABS: 10.0000 mg | ORAL_TABLET | Freq: Every day | ORAL | Status: DC | PRN

## 2022-08-02 MED ORDER — FUROSEMIDE 10 MG/ML IJ SOLN
40.0000 mg | Freq: Once | INTRAMUSCULAR | Status: AC
  Administered 2022-08-02: 40 mg via INTRAVENOUS
  Filled 2022-08-02: qty 4

## 2022-08-02 MED ORDER — HYDRALAZINE HCL 10 MG PO TABS
10.0000 mg | ORAL_TABLET | Freq: Two times a day (BID) | ORAL | Status: DC
  Administered 2022-08-02 – 2022-08-05 (×7): 10 mg via ORAL
  Filled 2022-08-02 (×9): qty 1

## 2022-08-02 MED ORDER — VANCOMYCIN HCL 2000 MG/400ML IV SOLN: 2000.0000 mg | INTRAVENOUS | Status: DC

## 2022-08-02 MED ORDER — LEVOTHYROXINE SODIUM 75 MCG PO TABS
75.0000 ug | ORAL_TABLET | Freq: Every day | ORAL | Status: DC
  Administered 2022-08-02 – 2022-08-05 (×4): 75 ug via ORAL
  Filled 2022-08-02 (×4): qty 1

## 2022-08-02 MED ORDER — INSULIN ASPART 100 UNIT/ML IJ SOLN
0.0000 [IU] | Freq: Every day | INTRAMUSCULAR | Status: DC
  Administered 2022-08-02: 5 [IU] via SUBCUTANEOUS

## 2022-08-02 MED ORDER — ENOXAPARIN SODIUM 40 MG/0.4ML IJ SOSY
40.0000 mg | PREFILLED_SYRINGE | INTRAMUSCULAR | Status: DC
  Administered 2022-08-02 – 2022-08-04 (×3): 40 mg via SUBCUTANEOUS
  Filled 2022-08-02 (×3): qty 0.4

## 2022-08-02 MED ORDER — PANTOPRAZOLE SODIUM 40 MG PO TBEC
40.0000 mg | DELAYED_RELEASE_TABLET | Freq: Every day | ORAL | Status: DC
  Administered 2022-08-02 – 2022-08-05 (×4): 40 mg via ORAL
  Filled 2022-08-02 (×4): qty 1

## 2022-08-02 MED ORDER — SODIUM CHLORIDE 0.9 % IV SOLN
2.0000 g | Freq: Three times a day (TID) | INTRAVENOUS | Status: DC
  Administered 2022-08-02 – 2022-08-04 (×6): 2 g via INTRAVENOUS
  Filled 2022-08-02 (×6): qty 12.5

## 2022-08-02 MED ORDER — PROCHLORPERAZINE EDISYLATE 10 MG/2ML IJ SOLN: 5.0000 mg | Freq: Four times a day (QID) | INTRAMUSCULAR | Status: DC | PRN

## 2022-08-02 MED ORDER — INSULIN GLARGINE-YFGN 100 UNIT/ML ~~LOC~~ SOLN
40.0000 [IU] | Freq: Every day | SUBCUTANEOUS | Status: DC
  Administered 2022-08-02: 40 [IU] via SUBCUTANEOUS
  Filled 2022-08-02 (×2): qty 0.4

## 2022-08-02 MED ORDER — VANCOMYCIN HCL 2000 MG/400ML IV SOLN
2000.0000 mg | Freq: Once | INTRAVENOUS | Status: AC
  Administered 2022-08-02: 2000 mg via INTRAVENOUS
  Filled 2022-08-02: qty 400

## 2022-08-02 MED ORDER — POTASSIUM CHLORIDE CRYS ER 10 MEQ PO TBCR: 10.0000 meq | EXTENDED_RELEASE_TABLET | Freq: Every morning | ORAL | Status: DC

## 2022-08-02 MED ORDER — POTASSIUM & SODIUM PHOSPHATES 280-160-250 MG PO PACK
1.0000 | PACK | Freq: Three times a day (TID) | ORAL | Status: DC
  Administered 2022-08-02 (×3): 1 via ORAL
  Filled 2022-08-02 (×4): qty 1

## 2022-08-02 MED ORDER — IPRATROPIUM-ALBUTEROL 0.5-2.5 (3) MG/3ML IN SOLN
3.0000 mL | Freq: Four times a day (QID) | RESPIRATORY_TRACT | Status: DC
  Administered 2022-08-02: 3 mL via RESPIRATORY_TRACT
  Filled 2022-08-02: qty 3

## 2022-08-02 MED ORDER — VANCOMYCIN HCL 750 MG/150ML IV SOLN
750.0000 mg | Freq: Two times a day (BID) | INTRAVENOUS | Status: DC
  Administered 2022-08-02 – 2022-08-03 (×2): 750 mg via INTRAVENOUS
  Filled 2022-08-02 (×2): qty 150

## 2022-08-02 MED ORDER — METOPROLOL TARTRATE 12.5 MG HALF TABLET
12.5000 mg | ORAL_TABLET | Freq: Two times a day (BID) | ORAL | Status: DC
  Administered 2022-08-02 – 2022-08-05 (×7): 12.5 mg via ORAL
  Filled 2022-08-02 (×7): qty 1

## 2022-08-02 MED ORDER — ALBUTEROL SULFATE (2.5 MG/3ML) 0.083% IN NEBU
2.5000 mg | INHALATION_SOLUTION | Freq: Four times a day (QID) | RESPIRATORY_TRACT | Status: DC | PRN
  Administered 2022-08-03: 2.5 mg via RESPIRATORY_TRACT
  Filled 2022-08-02: qty 3

## 2022-08-02 NOTE — Hospital Course (Addendum)
[name] is a 77 y.o. male with past medical history of CAD status post PCI, type 2 diabetes, hypertension, peripheral vascular disease, pulmonary sarcoidosis, obstructive sleep apnea presented to hospital with chest pain and dyspnea and cough.  Patient was recently treated for pneumonia with Augmentin and doxycycline.  On this presentation, vitals showed high per tension.  Labs showed leukocytosis, mild hyponatremia at 134, mild hypokalemia at 3.4.  CT angiogram of the chest showed multifocal pneumonia but no PE.  Patient was given broad-spectrum antibiotic and was admitted hospital for further evaluation and treatment.    Assessment and plan  Multifocal pneumonia, POA History of frequent pneumonia, in the setting of pulmonary sarcoidosis on chronic steroid Patient is immunocompromise due to chronic steroids.  Failed outpatient treatment.  BNP slightly elevated.  Procalcitonin pending.  MRSA screen pending.  Continue IV vancomycin and cefepime..  Follow-up sputum culture, blood cultures.  Consider pulmonary consultation.  Controlled with test negative.  Pulmonary sarcoidosis On prednisone at home.  Would benefit from pulmonary consultation.   Acute hypoxic respiratory failure secondary to pneumonia and pulmonary sarcoidosis. On supplemental oxygen.  Continue incentive spirometry early mobilization bronchodilators.   Elevated troponin, suspect demand ischemia in the setting of acute hypoxic respiratory failure Initial troponin 104. No evidence of acute ischemia on twelve-lead EKG.  Check 2D echocardiogram.   Chronic HFpEF 50- 55% Last 2D echo done on 03/17/2022 revealed LVEF 50-55% with grade 1 diastolic dysfunction.  Continue strict intake and output charting, daily weights.  BNP elevated at 332.  Check 2D echocardiogram.   Hyperlipidemia Continue Zetia and Crestor   Essential hypertension On hydralazine 10 mg twice daily at home including Lasix as needed and metoprolol 12.5 twice daily.   Will resume home medications.  Closely monitor.   Coronary artery disease Resume home aspirin, Plavix, Zetia, Crestor.   Hypothyroidism Continue Synthroid   Type 2 diabetes mellitus with hyperglycemia Latest hemoglobin A1c 9.4 on 03/17/2022 continue diabetic diet, sliding scale insulin.  On 70/30 insulin at 40 units 3 times daily.  Will start Semglee 40 units at bedtime.  Hypophosphatemia.  Phosphorus level of 2.0.  Will replace orally.  Mild hypokalemia.  Improved today.  Will continue to replace.

## 2022-08-02 NOTE — Progress Notes (Signed)
PROGRESS NOTE    [name]  YNW:[id number] DOB: [birth year] DOA: 08/01/2022 PCP: Johny Blamer, MD    Brief Narrative:  [name] is a 77 y.o. male with past medical history of CAD status post PCI, type 2 diabetes, hypertension, peripheral vascular disease, pulmonary sarcoidosis, obstructive sleep apnea presented to hospital with chest pain and dyspnea and cough.  Patient was recently treated for pneumonia with Augmentin and doxycycline.  States that he has had multiple episodes of flareup with pneumonia since last year.  On this presentation, vitals showed high per tension.  Labs showed leukocytosis, mild hyponatremia at 134, mild hypokalemia at 3.4.  CT angiogram of the chest showed multifocal pneumonia but no PE.  Patient was given broad-spectrum antibiotic and was admitted hospital for further evaluation and treatment.  Patient stated that he used to follow pulmonary Dr. Delford Field in the past but has not followed up with pulmonary for the last 10 years or so.  Assessment and plan  Multifocal pneumonia, POA History of frequent pneumonia, in the setting of pulmonary sarcoidosis on chronic steroid Patient is immunocompromised due to chronic steroids.  Failed outpatient treatment.  BNP slightly elevated.  Procalcitonin less than 0.10.  MRSA screen pending.  Continue IV vancomycin and cefepime.Marland Kitchen  Sputum culture with gram-negative rods and cocci in chains.  Blood cultures pending..  Used to see pulmonary Dr. Delford Field in the past who has retired so has not had recent follow-up  in 10 years or so..  Communicated with pulmonary for consultation.  On prednisone 10 mg at home will continue for now.  Pulmonary sarcoidosis On prednisone at home.  Would benefit from pulmonary consultation.  Has been on for several years now and has not had follow-up.   Acute hypoxic respiratory failure secondary to pneumonia and pulmonary sarcoidosis. On supplemental oxygen.  Continue incentive spirometry, early mobilization  bronchodilators.  Currently on 1-1/2 L of oxygen by nasal cannula.   Elevated troponin, suspect demand ischemia in the setting of acute hypoxic respiratory failure Initial troponin 104. No evidence of acute ischemia on twelve-lead EKG.  Check 2D echocardiogram.   Chronic HFpEF 50- 55% Last 2D echo done on 03/17/2022 revealed LVEF 50-55% with grade 1 diastolic dysfunction.  Continue strict intake and output charting, daily weights.  BNP elevated at 332.  Check 2D echocardiogram.  On as needed Lasix at home.  Will give one-time dose of IV Lasix today and reassess in AM.   Hyperlipidemia Continue Zetia and Crestor   Essential hypertension On hydralazine 10 mg twice daily at home including Lasix as needed and metoprolol 12.5 twice daily.  Will resume home medications.  Closely monitor.   Coronary artery disease Resume home aspirin, Plavix, Zetia, Crestor.   Hypothyroidism Continue Synthroid   Type 2 diabetes mellitus with hyperglycemia Latest hemoglobin A1c 9.4 on 03/17/2022 continue diabetic diet, sliding scale insulin.  On 70/30 insulin at 40 units 3 times daily.  Will start Semglee 40 units at bedtime.  Hypophosphatemia.  Phosphorus level of 2.0.  Will replace orally.  Check levels in AM.  Mild hypokalemia.  Improved today.  Will continue to replace.    DVT prophylaxis: enoxaparin (LOVENOX) injection 40 mg Start: 08/02/22 1600   Code Status:     Code Status: Full Code  Disposition: Home likely 1 to 2 days  Status is: Inpatient  Remains inpatient appropriate because: Frequent flareups, need for pulmonary evaluation, IV antibiotics,   Family Communication: Spoke with the patient's wife at bedside.  Consultants:  Pulmonary  Procedures:  None  Antimicrobials:  Vancomycin and cefepime  Anti-infectives (From admission, onward)    Start     Dose/Rate Route Frequency Ordered Stop   08/02/22 1800  vancomycin (VANCOREADY) IVPB 750 mg/150 mL        750 mg 150 mL/hr over 60  Minutes Intravenous Every 12 hours 08/02/22 0624     08/02/22 1200  ceFEPIme (MAXIPIME) 2 g in sodium chloride 0.9 % 100 mL IVPB        2 g 200 mL/hr over 30 Minutes Intravenous Every 8 hours 08/02/22 0624     08/02/22 0645  vancomycin (VANCOREADY) IVPB 2000 mg/400 mL  Status:  Discontinued        2,000 mg 200 mL/hr over 120 Minutes Intravenous Every 24 hours 08/02/22 0552 08/02/22 0624   08/02/22 0645  ceFEPIme (MAXIPIME) 2 g in sodium chloride 0.9 % 100 mL IVPB  Status:  Discontinued        2 g 200 mL/hr over 30 Minutes Intravenous Every 24 hours 08/02/22 0552 08/02/22 0624   08/02/22 0415  vancomycin (VANCOREADY) IVPB 2000 mg/400 mL        2,000 mg 200 mL/hr over 120 Minutes Intravenous  Once 08/02/22 0408 08/02/22 0722   08/02/22 0415  ceFEPIme (MAXIPIME) 2 g in sodium chloride 0.9 % 100 mL IVPB        2 g 200 mL/hr over 30 Minutes Intravenous  Once 08/02/22 0408 08/02/22 0501       Subjective: Today, patient was seen and examined at bedside.  Patient states that he feels miserable complains of pain while coughing in the chest is some shortness of breath.  Feels frustrated about his illness.  Has had to have frequent flareups  Objective: Vitals:   08/02/22 0515 08/02/22 0723 08/02/22 0747 08/02/22 1020  BP: (!) 146/89 (!) 158/69  125/63  Pulse: 82 76  61  Resp: 20 18    Temp: 98.6 F (37 C) 99.1 F (37.3 C)  98.4 F (36.9 C)  TempSrc: Oral Oral  Oral  SpO2: 96% 100% 96% 95%  Weight:        Intake/Output Summary (Last 24 hours) at 08/02/2022 1133 Last data filed at 08/02/2022 1036 Gross per 24 hour  Intake 95.97 ml  Output 250 ml  Net -154.03 ml   Filed Weights   08/01/22 2116  Weight: 96.6 kg    Physical Examination: Body mass index is 30.56 kg/m.  General: Obese built, not in obvious distress elderly male, on nasal cannula oxygen HENT:   No scleral pallor or icterus noted. Oral mucosa is moist.  Chest: Coarse breath sounds noted bilaterally, some wheezes  noted.  Diminished breath sounds bilaterally. CVS: S1 &S2 heard. No murmur.  Regular rate and rhythm. Abdomen: Soft, nontender, nondistended.  Bowel sounds are heard.   Extremities: No cyanosis, clubbing or edema.  Peripheral pulses are palpable. Psych: Alert, awake and oriented, normal mood CNS:  No cranial nerve deficits.  Power equal in all extremities.   Skin: Warm and dry.  No rashes noted.  Data Reviewed:   CBC: Recent Labs  Lab 08/01/22 2129 08/02/22 0654  WBC 13.2* 10.7*  NEUTROABS 9.6*  --   HGB 15.1 12.9*  HCT 47.3 40.0  MCV 97.9 96.2  PLT 246 197    Basic Metabolic Panel: Recent Labs  Lab 08/01/22 2129 08/02/22 0654  NA 134* 134*  K 3.4* 3.7  CL 96* 96*  CO2 25 28  GLUCOSE 194*  213*  BUN 14 15  CREATININE 1.01 1.05  CALCIUM 8.3* 7.2*  MG  --  1.7  PHOS  --  2.0*    Liver Function Tests: No results for input(s): "AST", "ALT", "ALKPHOS", "BILITOT", "PROT", "ALBUMIN" in the last 168 hours.   Radiology Studies: CT Angio Chest Pulmonary Embolism (PE) W or WO Contrast  Result Date: 08/02/2022 CLINICAL DATA:  Worsening cough and chest tightness. EXAM: CT ANGIOGRAPHY CHEST WITH CONTRAST TECHNIQUE: Multidetector CT imaging of the chest was performed using the standard protocol during bolus administration of intravenous contrast. Multiplanar CT image reconstructions and MIPs were obtained to evaluate the vascular anatomy. RADIATION DOSE REDUCTION: This exam was performed according to the departmental dose-optimization program which includes automated exposure control, adjustment of the mA and/or kV according to patient size and/or use of iterative reconstruction technique. CONTRAST:  75mL OMNIPAQUE IOHEXOL 350 MG/ML SOLN COMPARISON:  March 15, 2022 FINDINGS: Cardiovascular: There is mild calcification of the thoracic aorta, without evidence of aortic aneurysm. Satisfactory opacification of the pulmonary arteries to the segmental level. No evidence of pulmonary  embolism. Normal heart size with marked severity coronary artery calcification. Coronary artery stent is in place. No pericardial effusion. Mediastinum/Nodes: Small left hilar and right infrahilar calcified lymph nodes are seen. Thyroid gland, trachea, and esophagus demonstrate no significant findings. Lungs/Pleura: A small area of patchy infiltrate is seen within the posterolateral aspect of the left upper lobe. Mild to moderate severity tree-in-bud appearing infiltrates are also seen within the right middle lobe and right lower lobe. Marked severity right middle lobe and right lower lobe peribronchial thickening is also noted. A stable 4 mm subpleural right upper lobe lung nodule is seen (axial CT image 63, CT series 6). A stable 7 mm pleural based lung nodule is seen within the posteromedial aspect of the right lower lobe (axial CT image 68, CT series 6). A 4 mm calcified lung nodule is seen within the posterior aspect of the right lung base. No pleural effusion or pneumothorax is identified Upper Abdomen: Multiple surgical clips are seen within the gallbladder fossa. Musculoskeletal: No chest wall abnormality. No acute or significant osseous findings. Review of the MIP images confirms the above findings. IMPRESSION: 1. No evidence of pulmonary embolism. 2. Mild to moderate severity right middle lobe and right lower lobe tree-in-bud appearing infiltrates which may represent sequelae associated with atypical infection. 3. Mild patchy left upper lobe infiltrate. Follow-up to resolution is recommended to exclude the presence of an underlying neoplastic process. 4. Marked severity right middle lobe and right lower lobe peribronchial thickening likely consistent with marked severity bronchitis. 5. Findings likely consistent with sequelae associated with prior granulomatous disease. 6. Evidence of prior cholecystectomy. 7. Aortic atherosclerosis. Aortic Atherosclerosis (ICD10-I70.0). Electronically Signed   By:  Aram Candela M.D.   On: 08/02/2022 03:17   DG Chest 2 View  Result Date: 08/01/2022 CLINICAL DATA:  cough, chest pain EXAM: CHEST - 2 VIEW COMPARISON:  Chest x-ray 07/24/2022 FINDINGS: The heart and mediastinal contours are unchanged. Aortic calcification. Persistent right lower lobe airspace opacity. Persistent left mid lung zone airspace opacity. No pulmonary edema. No pleural effusion. No pneumothorax. No acute osseous abnormality. IMPRESSION: 1. Persistent right lower lobe and mid left lung zone airspace opacity suggestive of infection. Followup PA and lateral chest X-ray is recommended in 3-4 weeks following therapy to ensure resolution and exclude underlying malignancy. 2.  Aortic Atherosclerosis (ICD10-I70.0). Electronically Signed   By: Tish Frederickson M.D.   On: 08/01/2022 21:59  LOS: 0 days     Joycelyn Das, MD [facility] Available via Epic secure chat 7am-7pm After these hours, please refer to coverage provider listed on [email or web address] 08/02/2022, 11:33 AM

## 2022-08-02 NOTE — ED Notes (Signed)
ED TO INPATIENT HANDOFF REPORT  ED Nurse Name and Phone #:  Theadora Rama RN [phone]  S Name/Age/Gender [name] 77 y.o. male Room/Bed: 025C/025C  Code Status   Code Status: Full Code  Home/SNF/Other Home Patient oriented to: self, place, time, and situation Is this baseline? Yes   Triage Complete: Triage complete  Chief Complaint Multifocal pneumonia [J18.9]  Triage Note Pt in with worsened cough and chest tightness. Pt states 2 wks ago, his doctor placed him on abx for "lymphnode infection". Reporting fevers at home, frequent hx of PNA. Sats 89% on RA, placed on O2   Allergies Allergies  Allergen Reactions   Statins Other (See Comments)    Aggression  Pt ok to take crestor 20mg  once a week   Hydrocortisone Nausea Only   Ranexa [Ranolazine] Other (See Comments)    Severe weakness/near syncope after 1 dose Hallucinations    Doxycycline Diarrhea   Hydrocodone Nausea And Vomiting   Miralax [Polyethylene Glycol] Nausea And Vomiting   Z-Pak [Azithromycin] Other (See Comments)    Raises blood sugar   Zaroxolyn [Metolazone] Other (See Comments)    Drained, no energy    Level of Care/Admitting Diagnosis ED Disposition     ED Disposition  Admit   Condition  --   Comment  Hospital Area: [facility] [[id number]]  Level of Care: Telemetry Medical [104]  May admit patient to [facility] or [facility] if equivalent level of care is available:: No  Covid Evaluation: Asymptomatic - no recent exposure (last 10 days) testing not required  Diagnosis: Multifocal pneumonia [[id number]]  Admitting Physician: Darlin Drop [[id number]]  Attending Physician: Darlin Drop [[id number]]  Certification:: I certify this patient will need inpatient services for at least 2 midnights  Estimated Length of Stay: 2          B Medical/Surgery History Past Medical History:  Diagnosis Date   Adrenal insufficiency (HCC)    Allergy    Anemia    Arthritis    feet    Broken  foot 09/2019   had to wore a boot. Left    CKD (chronic kidney disease), stage III (HCC)    Coronary artery disease    has stents   Coronary atherosclerosis of native coronary artery    Proximal LAD, posterior lateral stent widely patent-10/12/11   Diabetes mellitus    insulin and pills   Hearing loss    wears hearing aids   History of blood transfusion 06/30/2016   [facility] - 2 units transfused   Hyperlipidemia    Hypertension    OSA (obstructive sleep apnea)    uses VPAC sleep study 2 years done through [facility]. Dr. Anne Fu arranged study   PVD (peripheral vascular disease) (HCC)    Sarcoidosis    Sleep apnea    uses CPAP nightly   Thyroid disease    Type 2 diabetes mellitus (HCC)    Past Surgical History:  Procedure Laterality Date   APPENDECTOMY     CATARACT EXTRACTION  2011   bilat   CHOLECYSTECTOMY  01/25/2011   Procedure: LAPAROSCOPIC CHOLECYSTECTOMY WITH INTRAOPERATIVE CHOLANGIOGRAM;  Surgeon: Rulon Abide, DO;  Location: [facility] OR;  Service: General;  Laterality: N/A;   COLONOSCOPY     several   COLONOSCOPY WITH PROPOFOL N/A 01/03/2020   Procedure: COLONOSCOPY WITH PROPOFOL;  Surgeon: Iva Boop, MD;  Location: [facility] ENDOSCOPY;  Service: Endoscopy;  Laterality: N/A;   CORONARY ANGIOPLASTY  most recent 11/2009   CORONARY PRESSURE/FFR STUDY N/A 01/18/2021   Procedure: INTRAVASCULAR PRESSURE WIRE/FFR STUDY;  Surgeon: Tonny Bollman, MD;  Location: [facility] INVASIVE CV LAB;  Service: Cardiovascular;  Laterality: N/A;   CORONARY STENT INTERVENTION N/A 08/07/2018   Procedure: CORONARY STENT INTERVENTION;  Surgeon: Corky Crafts, MD;  Location: [facility] INVASIVE CV LAB;  Service: Cardiovascular;  Laterality: N/A;   CORONARY STENT INTERVENTION N/A 01/18/2021   Procedure: CORONARY STENT INTERVENTION;  Surgeon: Tonny Bollman, MD;  Location: [facility] INVASIVE CV LAB;  Service: Cardiovascular;  Laterality: N/A;   CORONARY STENT PLACEMENT  2009   in LAD and side branch PTCA    ESOPHAGOGASTRODUODENOSCOPY (EGD) WITH PROPOFOL N/A 01/03/2020   Procedure: ESOPHAGOGASTRODUODENOSCOPY (EGD) WITH PROPOFOL;  Surgeon: Iva Boop, MD;  Location: [facility];  Service: Endoscopy;  Laterality: N/A;   HEMOSTASIS CLIP PLACEMENT  01/03/2020   Procedure: HEMOSTASIS CLIP PLACEMENT;  Surgeon: Iva Boop, MD;  Location: [facility];  Service: Endoscopy;;   INTERCOSTAL NERVE BLOCK  2011, 06/2016   x2. lumbar spine   LEFT HEART CATHETERIZATION WITH CORONARY ANGIOGRAM Bilateral 10/12/2011   Procedure: LEFT HEART CATHETERIZATION WITH CORONARY ANGIOGRAM;  Surgeon: Donato Schultz, MD;  Location: [facility] CATH LAB;  Service: Cardiovascular;  Laterality: Bilateral;   LEFT HEART CATHETERIZATION WITH CORONARY ANGIOGRAM N/A 04/01/2014   Procedure: LEFT HEART CATHETERIZATION WITH CORONARY ANGIOGRAM;  Surgeon: Donato Schultz, MD;  Location: [facility] CATH LAB;  Service: Cardiovascular;  Laterality: N/A;   POLYPECTOMY  01/03/2020   Procedure: POLYPECTOMY;  Surgeon: Iva Boop, MD;  Location: [facility];  Service: Endoscopy;;   RIGHT/LEFT HEART CATH AND CORONARY ANGIOGRAPHY N/A 08/07/2018   Procedure: RIGHT/LEFT HEART CATH AND CORONARY ANGIOGRAPHY;  Surgeon: Corky Crafts, MD;  Location: [facility] INVASIVE CV LAB;  Service: Cardiovascular;  Laterality: N/A;   RIGHT/LEFT HEART CATH AND CORONARY ANGIOGRAPHY N/A 12/25/2020   Procedure: RIGHT/LEFT HEART CATH AND CORONARY ANGIOGRAPHY;  Surgeon: Lyn Records, MD;  Location: [facility] INVASIVE CV LAB;  Service: Cardiovascular;  Laterality: N/A;   TRANSFORAMINAL LUMBAR INTERBODY FUSION W/ MIS 1 LEVEL N/A 09/24/2021   Procedure: LUMBAR FOUR-FIVE MINIMALLY INVASIVE (MIS) TRANSFORAMINAL LUMBAR INTERBODY FUSION (TLIF) WITH METRX;  Surgeon: Jadene Pierini, MD;  Location: [facility] OR;  Service: Neurosurgery;  Laterality: N/A;     A IV Location/Drains/Wounds Patient Lines/Drains/Airways Status     Active Line/Drains/Airways     Name Placement date Placement time Site Days    Peripheral IV 08/02/22 20 G Left Antecubital 08/02/22  0110  Antecubital  less than 1   Wound / Incision (Open or Dehisced) 03/17/22 Skin tear Pretibial Left 03/17/22  2100  Pretibial  138            Intake/Output Last 24 hours No intake or output data in the 24 hours ending 08/02/22 0441  Labs/Imaging Results for orders placed or performed during the hospital encounter of 08/01/22 (from the past 48 hour(s))  Basic metabolic panel     Status: Abnormal   Collection Time: 08/01/22  9:29 PM  Result Value Ref Range   Sodium 134 (L) 135 - 145 mmol/L   Potassium 3.4 (L) 3.5 - 5.1 mmol/L   Chloride 96 (L) 98 - 111 mmol/L   CO2 25 22 - 32 mmol/L   Glucose, Bld 194 (H) 70 - 99 mg/dL    Comment: Glucose reference range applies only to samples taken after fasting for at least 8 hours.   BUN 14 8 - 23 mg/dL   Creatinine,  Ser 1.01 0.61 - 1.24 mg/dL   Calcium 8.3 (L) 8.9 - 10.3 mg/dL   GFR, Estimated >16 >10 mL/min    Comment: (NOTE) Calculated using the CKD-EPI Creatinine Equation (2021)    Anion gap 13 5 - 15    Comment: Performed at [facility] Lab, [address] [place], [place] [address]  CBC     Status: Abnormal   Collection Time: 08/01/22  9:29 PM  Result Value Ref Range   WBC 13.2 (H) 4.0 - 10.5 K/uL   RBC 4.83 4.22 - 5.81 MIL/uL   Hemoglobin 15.1 13.0 - 17.0 g/dL   HCT 40.9 81.1 - 91.4 %   MCV 97.9 80.0 - 100.0 fL   MCH 31.3 26.0 - 34.0 pg   MCHC 31.9 30.0 - 36.0 g/dL   RDW 78.2 95.6 - 21.3 %   Platelets 246 150 - 400 K/uL   nRBC 0.0 0.0 - 0.2 %    Comment: Performed at [facility] Lab, [address], [place], [place] [address]  Troponin I (High Sensitivity)     Status: Abnormal   Collection Time: 08/01/22  9:29 PM  Result Value Ref Range   Troponin I (High Sensitivity) 104 (HH) <18 ng/L    Comment: CRITICAL RESULT CALLED TO, READ BACK BY AND VERIFIED WITH ANNA JASPER RN 08/01/22 2224 M KOROLESKI (NOTE) Elevated high sensitivity troponin I (hsTnI) values and  significant  changes across serial measurements may suggest ACS but many other  chronic and acute conditions are known to elevate hsTnI results.  Refer to the "Links" section for chest pain algorithms and additional  guidance. Performed at [facility], [address] [place], [place] [address]   Differential     Status: Abnormal   Collection Time: 08/01/22  9:29 PM  Result Value Ref Range   Neutrophils Relative % 72 %   Neutro Abs 9.6 (H) 1.7 - 7.7 K/uL   Lymphocytes Relative 14 %   Lymphs Abs 1.8 0.7 - 4.0 K/uL   Monocytes Relative 8 %   Monocytes Absolute 1.1 (H) 0.1 - 1.0 K/uL   Eosinophils Relative 1 %   Eosinophils Absolute 0.2 0.0 - 0.5 K/uL   Basophils Relative 1 %   Basophils Absolute 0.1 0.0 - 0.1 K/uL   Immature Granulocytes 4 %   Abs Immature Granulocytes 0.47 (H) 0.00 - 0.07 K/uL    Comment: Performed at [facility] Lab, [address], [place], [place] [address]  Troponin I (High Sensitivity)     Status: Abnormal   Collection Time: 08/01/22 11:23 PM  Result Value Ref Range   Troponin I (High Sensitivity) 107 (HH) <18 ng/L    Comment: CRITICAL VALUE NOTED. VALUE IS CONSISTENT WITH PREVIOUSLY REPORTED/CALLED VALUE (NOTE) Elevated high sensitivity troponin I (hsTnI) values and significant  changes across serial measurements may suggest ACS but many other  chronic and acute conditions are known to elevate hsTnI results.  Refer to the "Links" section for chest pain algorithms and additional  guidance. Performed at [facility], [address], [place], [place] [address]   Brain natriuretic peptide     Status: Abnormal   Collection Time: 08/02/22 12:01 AM  Result Value Ref Range   B Natriuretic Peptide 332.4 (H) 0.0 - 100.0 pg/mL    Comment: Performed at [facility] Lab, [address] [place], [place] [address]  Lactic acid, plasma     Status: None   Collection Time: 08/02/22  12:01 AM  Result Value Ref Range   Lactic Acid, Venous 1.3 0.5 -  1.9 mmol/L    Comment: Performed at [facility] Lab, [address] [place], [place] [address]  SARS Coronavirus 2 by RT PCR (hospital order, performed in [facility] hospital lab) *cepheid single result test* Anterior Nasal Swab     Status: None   Collection Time: 08/02/22 12:15 AM   Specimen: Anterior Nasal Swab  Result Value Ref Range   SARS Coronavirus 2 by RT PCR NEGATIVE NEGATIVE    Comment: Performed at [facility] Lab, [address], [place], [place] [address]  Lactic acid, plasma     Status: None   Collection Time: 08/02/22  3:27 AM  Result Value Ref Range   Lactic Acid, Venous 1.2 0.5 - 1.9 mmol/L    Comment: Performed at [facility] Lab, [address] [place], [place] [address]   CT Angio Chest Pulmonary Embolism (PE) W or WO Contrast  Result Date: 08/02/2022 CLINICAL DATA:  Worsening cough and chest tightness. EXAM: CT ANGIOGRAPHY CHEST WITH CONTRAST TECHNIQUE: Multidetector CT imaging of the chest was performed using the standard protocol during bolus administration of intravenous contrast. Multiplanar CT image reconstructions and MIPs were obtained to evaluate the vascular anatomy. RADIATION DOSE REDUCTION: This exam was performed according to the departmental dose-optimization program which includes automated exposure control, adjustment of the mA and/or kV according to patient size and/or use of iterative reconstruction technique. CONTRAST:  75mL OMNIPAQUE IOHEXOL 350 MG/ML SOLN COMPARISON:  March 15, 2022 FINDINGS: Cardiovascular: There is mild calcification of the thoracic aorta, without evidence of aortic aneurysm. Satisfactory opacification of the pulmonary arteries to the segmental level. No evidence of pulmonary embolism. Normal heart size with marked severity coronary artery calcification. Coronary artery stent is in place. No pericardial effusion. Mediastinum/Nodes: Small left hilar and right infrahilar calcified lymph nodes are seen. Thyroid gland, trachea, and  esophagus demonstrate no significant findings. Lungs/Pleura: A small area of patchy infiltrate is seen within the posterolateral aspect of the left upper lobe. Mild to moderate severity tree-in-bud appearing infiltrates are also seen within the right middle lobe and right lower lobe. Marked severity right middle lobe and right lower lobe peribronchial thickening is also noted. A stable 4 mm subpleural right upper lobe lung nodule is seen (axial CT image 63, CT series 6). A stable 7 mm pleural based lung nodule is seen within the posteromedial aspect of the right lower lobe (axial CT image 68, CT series 6). A 4 mm calcified lung nodule is seen within the posterior aspect of the right lung base. No pleural effusion or pneumothorax is identified Upper Abdomen: Multiple surgical clips are seen within the gallbladder fossa. Musculoskeletal: No chest wall abnormality. No acute or significant osseous findings. Review of the MIP images confirms the above findings. IMPRESSION: 1. No evidence of pulmonary embolism. 2. Mild to moderate severity right middle lobe and right lower lobe tree-in-bud appearing infiltrates which may represent sequelae associated with atypical infection. 3. Mild patchy left upper lobe infiltrate. Follow-up to resolution is recommended to exclude the presence of an underlying neoplastic process. 4. Marked severity right middle lobe and right lower lobe peribronchial thickening likely consistent with marked severity bronchitis. 5. Findings likely consistent with sequelae associated with prior granulomatous disease. 6. Evidence of prior cholecystectomy. 7. Aortic atherosclerosis. Aortic Atherosclerosis (ICD10-I70.0). Electronically Signed   By: Aram Candela M.D.   On: 08/02/2022 03:17   DG Chest 2 View  Result Date: 08/01/2022 CLINICAL DATA:  cough, chest pain EXAM: CHEST - 2 VIEW COMPARISON:  Chest x-ray 07/24/2022 FINDINGS: The heart and mediastinal contours are unchanged. Aortic  calcification. Persistent right lower lobe airspace opacity. Persistent left mid lung zone airspace opacity. No pulmonary edema. No pleural effusion. No pneumothorax. No acute osseous abnormality. IMPRESSION: 1. Persistent right lower lobe and mid left lung zone airspace opacity suggestive of infection. Followup PA and lateral chest X-ray is recommended in 3-4 weeks following therapy to ensure resolution and exclude underlying malignancy. 2.  Aortic Atherosclerosis (ICD10-I70.0). Electronically Signed   By: Tish Frederickson M.D.   On: 08/01/2022 21:59    Pending Labs Unresulted Labs (From admission, onward)     Start     Ordered   08/09/22 0500  Creatinine, serum  (enoxaparin (LOVENOX)    CrCl >/= 30 ml/min)  Weekly,   R     Comments: while on enoxaparin therapy    08/02/22 0422   08/02/22 0500  CBC  Tomorrow morning,   R        08/02/22 0426   08/02/22 0500  Basic metabolic panel  Tomorrow morning,   R        08/02/22 0426   08/02/22 0500  Magnesium  Tomorrow morning,   R        08/02/22 0426   08/02/22 0500  Phosphorus  Tomorrow morning,   R        08/02/22 0426   08/02/22 0437  MRSA Next Gen by PCR, Nasal  Once,   R       Question:  Patient immune status  Answer:  Normal   08/02/22 0436   08/01/22 2347  Culture, blood (Routine X 2) w Reflex to ID Panel  BLOOD CULTURE X 2,   R      08/01/22 2346            Vitals/Pain Today's Vitals   08/01/22 2116 08/02/22 0220 08/02/22 0330 08/02/22 0430  BP:  126/78 (!) 154/77 (!) 163/124  Pulse:  80 99 85  Resp:  (!) 27 (!) 22 17  Temp:  98.7 F (37.1 C)  98.6 F (37 C)  TempSrc:  Oral  Oral  SpO2:  96% 97% 96%  Weight: 96.6 kg     PainSc: 9        Isolation Precautions Airborne and Contact precautions  Medications Medications  vancomycin (VANCOREADY) IVPB 2000 mg/400 mL (has no administration in time range)  ceFEPIme (MAXIPIME) 2 g in sodium chloride 0.9 % 100 mL IVPB (2 g Intravenous New Bag/Given 08/02/22 0431)  enoxaparin  (LOVENOX) injection 40 mg (has no administration in time range)  insulin aspart (novoLOG) injection 0-9 Units (has no administration in time range)  insulin aspart (novoLOG) injection 0-5 Units (has no administration in time range)  ipratropium-albuterol (DUONEB) 0.5-2.5 (3) MG/3ML nebulizer solution 3 mL (has no administration in time range)  iohexol (OMNIPAQUE) 350 MG/ML injection 75 mL (75 mLs Intravenous Contrast Given 08/02/22 0303)    Mobility walks     Focused Assessments Cardiac Assessment Handoff:  Cardiac Rhythm: Normal sinus rhythm Lab Results  Component Value Date   CKTOTAL 69 03/17/2022   CKMB 2.2 11/09/2010   TROPONINI <0.30 11/09/2010   Lab Results  Component Value Date   DDIMER 1.08 (H) 11/09/2010   Does the Patient currently have chest pain? No   , Neuro Assessment Handoff:  Swallow screen pass? Yes  Cardiac Rhythm: Normal sinus rhythm  Neuro Assessment:   Neuro Checks:      Has TPA been given? No If patient is a Neuro Trauma and patient is going to OR before floor call report to 4N Charge nurse: [phone] or [phone]  , Pulmonary Assessment Handoff:  Lung sounds:   O2 Device: Nasal Cannula O2 Flow Rate (L/min): 2 L/min    R Recommendations: See Admitting Provider Note  Report given to:   Additional Notes:

## 2022-08-02 NOTE — Progress Notes (Signed)
Initial Nutrition Assessment  DOCUMENTATION CODES:   Obesity unspecified  INTERVENTION:  - Add Glucerna Shake po TID, each supplement provides 220 kcal and 10 grams of protein  - Add Banatrol BID.   - Add MVI q day.   NUTRITION DIAGNOSIS:   Inadequate oral intake related to nausea, poor appetite as evidenced by per patient/family report.  GOAL:   Patient will meet greater than or equal to 90% of their needs  MONITOR:   PO intake, Supplement acceptance  REASON FOR ASSESSMENT:   Malnutrition Screening Tool    ASSESSMENT:   77 y.o. male admits related to cough, SOB, chest pain, and cough. PMH includes: CKD, DM, HLD, HTN, OSA, PVD, T2DM. Pt is currently receiving medical management related to multifocal pneumonia.  Meds reviewed: sliding scale insulin, semglee, phos nak, prednisone. Labs reviewed: Na low, phos low.   The pt reports that he has had no appetite for the past 3 days. He states that he has had ongoing nausea and loose stools. He states that his constant coughing has caused him to not want to eat. He states that he did try a Glucerna today and really enjoyed it. RD will add Glucerna TID. RD also discussed Banatrol with pt to help with diarrhea. Pt agreed to try. RD will continue to monitor PO intakes.   NUTRITION - FOCUSED PHYSICAL EXAM:  Flowsheet Row Most Recent Value  Orbital Region No depletion  Upper Arm Region No depletion  Thoracic and Lumbar Region No depletion  Buccal Region No depletion  Temple Region No depletion  Clavicle Bone Region No depletion  Clavicle and Acromion Bone Region No depletion  Scapular Bone Region No depletion  Dorsal Hand No depletion  Patellar Region No depletion  Anterior Thigh Region No depletion  Posterior Calf Region No depletion  Edema (RD Assessment) Mild  Hair Reviewed  Eyes Reviewed  Mouth Reviewed  Skin Reviewed  Nails Reviewed       Diet Order:   Diet Order             Diet heart healthy/carb modified  Fluid consistency: Thin  Diet effective now                   EDUCATION NEEDS:   Not appropriate for education at this time  Skin:  Skin Assessment: Reviewed RN Assessment  Last BM:  5/28 - per pt report  Height:   Ht Readings from Last 1 Encounters:  07/12/22 5\' 10"  (1.778 m)    Weight:   Wt Readings from Last 1 Encounters:  08/01/22 96.6 kg    Ideal Body Weight:     BMI:  Body mass index is 30.56 kg/m.  Estimated Nutritional Needs:   Kcal:  1930-2400 kcals  Protein:  95-120 gm  Fluid:  >/= 1.9 L  Bethann Humble, RD, LDN, CNSC.

## 2022-08-02 NOTE — Progress Notes (Signed)
Pharmacy Antibiotic Note  [name] is a 77 y.o. male admitted on 08/01/2022 with pneumonia.  Pharmacy has been consulted for vancomycin and cefepime dosing.  Plan: Vancomycin 2000mg  x1 then 750mg  IV Q12H. Goal AUC 400-550.  Expected AUC 530. Cefepime 2g IV Q8H.  Weight: 96.6 kg (213 lb)  Temp (24hrs), Avg:98.6 F (37 C), Min:98.4 F (36.9 C), Max:98.7 F (37.1 C)  Recent Labs  Lab 08/01/22 2129 08/02/22 0001 08/02/22 0327  WBC 13.2*  --   --   CREATININE 1.01  --   --   LATICACIDVEN  --  1.3 1.2    Estimated Creatinine Clearance: 72.5 mL/min (by C-G formula based on SCr of 1.01 mg/dL).    Allergies  Allergen Reactions   Statins Other (See Comments)    Aggression  Pt ok to take crestor 20mg  once a week   Hydrocortisone Nausea Only   Ranexa [Ranolazine] Other (See Comments)    Severe weakness/near syncope after 1 dose Hallucinations    Doxycycline Diarrhea   Hydrocodone Nausea And Vomiting   Miralax [Polyethylene Glycol] Nausea And Vomiting   Z-Pak [Azithromycin] Other (See Comments)    Raises blood sugar   Zaroxolyn [Metolazone] Other (See Comments)    Drained, no energy    Thank you for allowing pharmacy to be a part of this patient's care.  Vernard Gambles, PharmD, BCPS  08/02/2022 6:25 AM

## 2022-08-02 NOTE — Inpatient Diabetes Management (Signed)
Inpatient Diabetes Program Recommendations  AACE/ADA: New Consensus Statement on Inpatient Glycemic Control (2015)  Target Ranges:  Prepandial:   less than 140 mg/dL      Peak postprandial:   less than 180 mg/dL (1-2 hours)      Critically ill patients:  140 - 180 mg/dL   Lab Results  Component Value Date   GLUCAP 350 (H) 08/02/2022   HGBA1C 9.4 (H) 03/17/2022    Review of Glycemic Control  Latest Reference Range & Units 08/02/22 08:19 08/02/22 12:46  Glucose-Capillary 70 - 99 mg/dL 161 (H) 096 (H)   Diabetes history: DM  Outpatient Diabetes medications:  70/30 40 units bid with meals  Current orders for Inpatient glycemic control:  Semglee 40 units q HS Novolog 0-9 units tid with meals and HS  Inpatient Diabetes Program Recommendations:    May consider adding Novolog meal coverage 6 units tid with meals (hold if patient eats less than 50% or NPO). Will follow.   Thanks,  Beryl Meager, RN, BC-ADM Inpatient Diabetes Coordinator Pager [phone]  (8a-5p)

## 2022-08-02 NOTE — Consult Note (Signed)
NAME:  [name], MRN:  [id number], DOB:  05-07-1945, LOS: 0 ADMISSION DATE:  08/01/2022, CONSULTATION DATE:  08/02/2022  REFERRING MD: Dr. Tyson Babinski, CHIEF COMPLAINT: Recurrent pneumonias  History of Present Illness:  Patient with a history of sarcoidosis was chronically been on steroids Has been on 10 mg of prednisone daily for about 10 years now Has always done poorly all the times he has tried to stop steroids recently  Has been treated for pneumonia at least 7-9 times recently with the last one being about a month ago when he used a course of doxycycline and Augmentin  Diagnosed with sarcoidosis about 20 years ago and he was started on steroids then  He used to see Dr. Danise Mina  His primary doctor has been writing his steroid prescriptions  He has a history of type 2 diabetes, hypertension, peripheral vascular disease, pulmonary sarcoidosis, obstructive sleep apnea, coronary artery disease  He did have leukocytosis, hyponatremia  CT scan of the chest showing multifocal infiltrate with some basal scarring worse on the right  Pertinent  Medical History   Past Medical History:  Diagnosis Date   Adrenal insufficiency (HCC)    Allergy    Anemia    Arthritis    feet    Broken foot 09/2019   had to wore a boot. Left    CKD (chronic kidney disease), stage III (HCC)    Coronary artery disease    has stents   Coronary atherosclerosis of native coronary artery    Proximal LAD, posterior lateral stent widely patent-10/12/11   Diabetes mellitus    insulin and pills   Hearing loss    wears hearing aids   History of blood transfusion 06/30/2016   [facility] - 2 units transfused   Hyperlipidemia    Hypertension    OSA (obstructive sleep apnea)    uses VPAC sleep study 2 years done through [place]. Dr. Anne Fu arranged study   PVD (peripheral vascular disease) (HCC)    Sarcoidosis    Sleep apnea    uses CPAP nightly   Thyroid disease    Type 2 diabetes mellitus (HCC)       Significant Hospital Events: Including procedures, antibiotic start and stop dates in addition to other pertinent events   5/27-CT scan of the chest reviewed by myself showing basal scarring and new nodular changes at the bases bilaterally   Interim History / Subjective:  Feels his breathing feels a tad better, not back to baseline, has had so many infections recently that he worries that is just a recurrent process for him  Objective   Blood pressure 125/63, pulse 61, temperature 98.4 F (36.9 C), temperature source Oral, resp. rate 18, weight 96.6 kg, SpO2 95 %.        Intake/Output Summary (Last 24 hours) at 08/02/2022 1522 Last data filed at 08/02/2022 1036 Gross per 24 hour  Intake 95.97 ml  Output 250 ml  Net -154.03 ml   Filed Weights   08/01/22 2116  Weight: 96.6 kg    Examination: General: Elderly, does not appear to be in distress HENT: Moist oral mucosa Lungs: Coarse breath sounds at bases Cardiovascular: S1-S2 appreciated Abdomen: Soft, bowel sounds appreciated Extremities: No clubbing, no edema Neuro: Alert and oriented x 3 GU:   Resolved Hospital Problem list     Assessment & Plan:  77 year old gentleman with a history of sarcoidosis who has chronically been on steroids for almost 20 years now, for the last 10 years  he has been on 10 mg of prednisone Over the last year and a half has had multiple flares of pneumonia treated with different courses of antibiotics, it would usually improve enough to get to baseline and then have a recurrent infection.  Admitted this time around for pneumonia.  CT scan currently is consistent with having a pneumonia as it is significantly different from the one he had about January of this year with nodular changes at the bases, may have some scarring at the right base  Will suggest to continue antibiotics at present Follow-up on cultures  For his sarcoidosis and treatments, may need to be transitioned off steroids with  the use of possibly methotrexate if it can be tolerated He stated that all the times that he is trying to wean off steroids, as always deteriorated enough to not being able to function at all  Hypoxemic respiratory failure -Oxygen supplementation as needed -I doubt if he needs oxygen long-term  Demand ischemia  Heart failure with preserved ejection fraction -He uses Lasix as needed at home  History of obstructive sleep apnea  At present I think is appropriate to continue antibiotics and de-escalate to Levaquin when he is more stable  I do not believe a bronchoscopy would be beneficial at present although this could be an option of further evaluation if it does not continue to respond well to current antibiotic therapy  Further discussion with him will need to be had with respect to being able to wean him off steroids Will need pulmonary function test at some point when more stable as an outpatient  Thank you for the consultation  Virl Diamond, MD [place] [facility] Pager: See Loretha Stapler

## 2022-08-02 NOTE — Progress Notes (Signed)
Echocardiogram 2D Echocardiogram has been performed.  [name] 08/02/2022, 2:13 PM

## 2022-08-02 NOTE — H&P (Addendum)
History and Physical  [name] HYQ:[id number] DOB: 1945/11/09 DOA: 08/01/2022  Referring physician: Dr. Blinda Leatherwood, EDP  PCP: Johny Blamer, MD  Outpatient Specialists: Cardiology, GI Patient coming from: Home  Chief Complaint: Cough, shortness of breath, chest pain with cough.   HPI: [name] is a 77 y.o. male with medical history significant for coronary artery disease status post PCI on Plavix, type 2 diabetes, hypertension, peripheral vascular disease, pulmonary sarcoidosis on chronic steroid, frequent pneumonias, OSA, who presented to [facility] ED from home due to centrally located chest pain associated with dyspnea and worsening productive cough.  The patient was recently treated for pneumonia outpatient with Augmentin and Doxycycline.  Denies dysphagia to solids or liquids.  Due to persistent symptomatology decided to present to the ED for further evaluation.  In the ED, noted productive cough.  Lab studies remarkable for leukocytosis.  CT angio chest was negative for PE but showed multifocal pneumonia.  The patient was started on empiric broad spectrum IV antibiotics IV Vancomycin and Cefepime.  Admitted by [facility], hospitalist service.  ED Course: Temperature 98.6.  BP 163/124, pulse 85, respiratory 17, saturation 96% on room air.  Lab studies notable for serum sodium 134, potassium 3.4, glucose 194.  WBC 13.2, neutrophil count 9.6.  Monocyte 1.1.  Review of Systems: Review of systems as noted in the HPI. All other systems reviewed and are negative.   Past Medical History:  Diagnosis Date   Adrenal insufficiency (HCC)    Allergy    Anemia    Arthritis    feet    Broken foot 09/2019   had to wore a boot. Left    CKD (chronic kidney disease), stage III (HCC)    Coronary artery disease    has stents   Coronary atherosclerosis of native coronary artery    Proximal LAD, posterior lateral stent widely patent-10/12/11   Diabetes mellitus    insulin and pills   Hearing loss    wears  hearing aids   History of blood transfusion 06/30/2016   [facility] - 2 units transfused   Hyperlipidemia    Hypertension    OSA (obstructive sleep apnea)    uses VPAC sleep study 2 years done through [place]. Dr. Anne Fu arranged study   PVD (peripheral vascular disease) (HCC)    Sarcoidosis    Sleep apnea    uses CPAP nightly   Thyroid disease    Type 2 diabetes mellitus (HCC)    Past Surgical History:  Procedure Laterality Date   APPENDECTOMY     CATARACT EXTRACTION  2011   bilat   CHOLECYSTECTOMY  01/25/2011   Procedure: LAPAROSCOPIC CHOLECYSTECTOMY WITH INTRAOPERATIVE CHOLANGIOGRAM;  Surgeon: Rulon Abide, DO;  Location: [facility] OR;  Service: General;  Laterality: N/A;   COLONOSCOPY     several   COLONOSCOPY WITH PROPOFOL N/A 01/03/2020   Procedure: COLONOSCOPY WITH PROPOFOL;  Surgeon: Iva Boop, MD;  Location: [facility] ENDOSCOPY;  Service: Endoscopy;  Laterality: N/A;   CORONARY ANGIOPLASTY     most recent 11/2009   CORONARY PRESSURE/FFR STUDY N/A 01/18/2021   Procedure: INTRAVASCULAR PRESSURE WIRE/FFR STUDY;  Surgeon: Tonny Bollman, MD;  Location: [facility] INVASIVE CV LAB;  Service: Cardiovascular;  Laterality: N/A;   CORONARY STENT INTERVENTION N/A 08/07/2018   Procedure: CORONARY STENT INTERVENTION;  Surgeon: Corky Crafts, MD;  Location: [facility] INVASIVE CV LAB;  Service: Cardiovascular;  Laterality: N/A;   CORONARY STENT INTERVENTION N/A 01/18/2021   Procedure: CORONARY STENT INTERVENTION;  Surgeon: Excell Seltzer,  Casimiro Needle, MD;  Location: [facility] INVASIVE CV LAB;  Service: Cardiovascular;  Laterality: N/A;   CORONARY STENT PLACEMENT  2009   in LAD and side branch PTCA   ESOPHAGOGASTRODUODENOSCOPY (EGD) WITH PROPOFOL N/A 01/03/2020   Procedure: ESOPHAGOGASTRODUODENOSCOPY (EGD) WITH PROPOFOL;  Surgeon: Iva Boop, MD;  Location: [facility] ENDOSCOPY;  Service: Endoscopy;  Laterality: N/A;   HEMOSTASIS CLIP PLACEMENT  01/03/2020   Procedure: HEMOSTASIS CLIP PLACEMENT;  Surgeon: Iva Boop, MD;  Location: [facility] ENDOSCOPY;  Service: Endoscopy;;   INTERCOSTAL NERVE BLOCK  2011, 06/2016   x2. lumbar spine   LEFT HEART CATHETERIZATION WITH CORONARY ANGIOGRAM Bilateral 10/12/2011   Procedure: LEFT HEART CATHETERIZATION WITH CORONARY ANGIOGRAM;  Surgeon: Donato Schultz, MD;  Location: [facility] CATH LAB;  Service: Cardiovascular;  Laterality: Bilateral;   LEFT HEART CATHETERIZATION WITH CORONARY ANGIOGRAM N/A 04/01/2014   Procedure: LEFT HEART CATHETERIZATION WITH CORONARY ANGIOGRAM;  Surgeon: Donato Schultz, MD;  Location: [facility] CATH LAB;  Service: Cardiovascular;  Laterality: N/A;   POLYPECTOMY  01/03/2020   Procedure: POLYPECTOMY;  Surgeon: Iva Boop, MD;  Location: [facility] ENDOSCOPY;  Service: Endoscopy;;   RIGHT/LEFT HEART CATH AND CORONARY ANGIOGRAPHY N/A 08/07/2018   Procedure: RIGHT/LEFT HEART CATH AND CORONARY ANGIOGRAPHY;  Surgeon: Corky Crafts, MD;  Location: [facility] INVASIVE CV LAB;  Service: Cardiovascular;  Laterality: N/A;   RIGHT/LEFT HEART CATH AND CORONARY ANGIOGRAPHY N/A 12/25/2020   Procedure: RIGHT/LEFT HEART CATH AND CORONARY ANGIOGRAPHY;  Surgeon: Lyn Records, MD;  Location: [facility] INVASIVE CV LAB;  Service: Cardiovascular;  Laterality: N/A;   TRANSFORAMINAL LUMBAR INTERBODY FUSION W/ MIS 1 LEVEL N/A 09/24/2021   Procedure: LUMBAR FOUR-FIVE MINIMALLY INVASIVE (MIS) TRANSFORAMINAL LUMBAR INTERBODY FUSION (TLIF) WITH METRX;  Surgeon: Jadene Pierini, MD;  Location: [facility] OR;  Service: Neurosurgery;  Laterality: N/A;    Social History:  reports that he has never smoked. He has never used smokeless tobacco. He reports that he does not drink alcohol and does not use drugs.   Allergies  Allergen Reactions   Statins Other (See Comments)    Aggression  Pt ok to take crestor 20mg  once a week   Hydrocortisone Nausea Only   Ranexa [Ranolazine] Other (See Comments)    Severe weakness/near syncope after 1 dose Hallucinations    Doxycycline Diarrhea   Hydrocodone Nausea And Vomiting    Miralax [Polyethylene Glycol] Nausea And Vomiting   Z-Pak [Azithromycin] Other (See Comments)    Raises blood sugar   Zaroxolyn [Metolazone] Other (See Comments)    Drained, no energy    Family History  Problem Relation Age of Onset   Kidney disease Mother    Diabetes Mother    Kidney cancer Mother    Heart attack Father    Asthma Sister    Anesthesia problems Sister        "Kidney's did not wake up"   Sarcoidosis Sister    Sarcoidosis Niece    Colon cancer Neg Hx    Colon polyps Neg Hx    Rectal cancer Neg Hx    Stomach cancer Neg Hx       Prior to Admission medications   Medication Sig Start Date End Date Taking? Authorizing Provider  acetaminophen (TYLENOL) 500 MG tablet Take 1,000 mg by mouth daily as needed for mild pain (pain).   Yes [provider]  albuterol (PROVENTIL) (2.5 MG/3ML) 0.083% nebulizer solution Take 3 mLs (2.5 mg total) by nebulization every 6 (six) hours as needed for wheezing or shortness of breath.  Dx Code D86.0 07/24/14  Yes Storm Frisk, MD  amoxicillin-clavulanate (AUGMENTIN) 875-125 MG tablet Take 1 tablet by mouth 2 (two) times daily. 07/27/22  Yes [provider]  aspirin EC 81 MG tablet Take 81 mg by mouth every evening.    Yes [provider]  clopidogrel (PLAVIX) 75 MG tablet Take 1 tablet (75 mg total) by mouth daily. Restart on 09/28/21 09/25/21  Yes Sherryll Burger, Pratik D, DO  ezetimibe (ZETIA) 10 MG tablet Take 10 mg by mouth in the morning. 06/25/21  Yes [provider]  furosemide (LASIX) 80 MG tablet Take 1 tablet (80 mg total) by mouth daily as needed for edema (take Lasix 80 mg as needed if gains more than 3 pounds in 1 day or 5 pounds in 1 week). 10/21/21  Yes Lanae Boast, MD  hydrALAZINE (APRESOLINE) 10 MG tablet Take 10 mg by mouth in the morning and at bedtime.   Yes [provider]  insulin aspart protamine- aspart (NOVOLOG MIX 70/30) (70-30) 100 UNIT/ML injection Inject 0.4 mLs (40 Units total) into  the skin with breakfast, with lunch, and with evening meal. 09/16/21  Yes Vann, Jessica U, DO  Iron, Ferrous Sulfate, 325 (65 Fe) MG TABS Take 325 mg by mouth daily. 01/18/22  Yes Iva Boop, MD  levothyroxine (SYNTHROID) 75 MCG tablet Take 75 mcg by mouth in the morning. 02/24/21  Yes [provider]  metoprolol tartrate (LOPRESSOR) 25 MG tablet Take 0.5 tablets (12.5 mg total) by mouth 2 (two) times daily. 02/03/22  Yes Conte, Tessa N, PA-C  montelukast (SINGULAIR) 10 MG tablet Take 10 mg by mouth daily as needed (allergies). 07/18/18  Yes [provider]  potassium chloride (KLOR-CON M) 10 MEQ tablet Take 10 mEq by mouth in the morning.   Yes [provider]  predniSONE (DELTASONE) 10 MG tablet Take 10 mg by mouth in the morning. Continuous course.   Yes [provider]  RABEprazole (ACIPHEX) 20 MG tablet Take 20 mg by mouth daily as needed (GERD). 09/28/21  Yes [provider]  rosuvastatin (CRESTOR) 20 MG tablet Take 1 tablet (20 mg total) by mouth at bedtime. Patient taking differently: Take 20 mg by mouth every Sunday. 02/10/22  Yes Conte, Tessa N, PA-C  silver sulfADIAZINE (SILVADENE) 1 % cream Apply 1 Application topically daily. Apply to leg 07/21/22  Yes [provider]  nitroGLYCERIN (NITROSTAT) 0.4 MG SL tablet Place 1 tablet (0.4 mg total) under the tongue every 5 (five) minutes as needed for chest pain. Patient not taking: Reported on 08/02/2022 07/01/20 08/02/23  Dyann Kief, PA-C    Physical Exam: BP (!) 154/77   Pulse 99   Temp 98.7 F (37.1 C) (Oral)   Resp (!) 22   Wt 96.6 kg   SpO2 97%   BMI 30.56 kg/m   General: 77 y.o. year-old male well developed well nourished in no acute distress.  Somnolent but arousable to voice.   Cardiovascular: Regular rate and rhythm with no rubs or gallops.  No thyromegaly or JVD noted.  Lower extremity edema bilaterally. Respiratory: Mild rales at bases.  Some wheezing noted. Poor  inspiratory effort. Abdomen: Soft nontender nondistended with normal bowel sounds x4 quadrants. Muskuloskeletal: No cyanosis or clubbing noted bilaterally Neuro: CN II-XII intact, strength, sensation, reflexes Skin: No ulcerative lesions noted or rashes Psychiatry: Mood is appropriate for condition and setting          Labs on Admission:  Basic Metabolic Panel: Recent  Labs  Lab 08/01/22 2129  NA 134*  K 3.4*  CL 96*  CO2 25  GLUCOSE 194*  BUN 14  CREATININE 1.01  CALCIUM 8.3*   Liver Function Tests: No results for input(s): "AST", "ALT", "ALKPHOS", "BILITOT", "PROT", "ALBUMIN" in the last 168 hours. No results for input(s): "LIPASE", "AMYLASE" in the last 168 hours. No results for input(s): "AMMONIA" in the last 168 hours. CBC: Recent Labs  Lab 08/01/22 2129  WBC 13.2*  NEUTROABS 9.6*  HGB 15.1  HCT 47.3  MCV 97.9  PLT 246   Cardiac Enzymes: No results for input(s): "CKTOTAL", "CKMB", "CKMBINDEX", "TROPONINI" in the last 168 hours.  BNP (last 3 results) Recent Labs    03/18/22 0415 03/20/22 0250 08/02/22 0001  BNP 337.5* 778.5* 332.4*    ProBNP (last 3 results) No results for input(s): "PROBNP" in the last 8760 hours.  CBG: No results for input(s): "GLUCAP" in the last 168 hours.  Radiological Exams on Admission: CT Angio Chest Pulmonary Embolism (PE) W or WO Contrast  Result Date: 08/02/2022 CLINICAL DATA:  Worsening cough and chest tightness. EXAM: CT ANGIOGRAPHY CHEST WITH CONTRAST TECHNIQUE: Multidetector CT imaging of the chest was performed using the standard protocol during bolus administration of intravenous contrast. Multiplanar CT image reconstructions and MIPs were obtained to evaluate the vascular anatomy. RADIATION DOSE REDUCTION: This exam was performed according to the departmental dose-optimization program which includes automated exposure control, adjustment of the mA and/or kV according to patient size and/or use of iterative reconstruction  technique. CONTRAST:  75mL OMNIPAQUE IOHEXOL 350 MG/ML SOLN COMPARISON:  March 15, 2022 FINDINGS: Cardiovascular: There is mild calcification of the thoracic aorta, without evidence of aortic aneurysm. Satisfactory opacification of the pulmonary arteries to the segmental level. No evidence of pulmonary embolism. Normal heart size with marked severity coronary artery calcification. Coronary artery stent is in place. No pericardial effusion. Mediastinum/Nodes: Small left hilar and right infrahilar calcified lymph nodes are seen. Thyroid gland, trachea, and esophagus demonstrate no significant findings. Lungs/Pleura: A small area of patchy infiltrate is seen within the posterolateral aspect of the left upper lobe. Mild to moderate severity tree-in-bud appearing infiltrates are also seen within the right middle lobe and right lower lobe. Marked severity right middle lobe and right lower lobe peribronchial thickening is also noted. A stable 4 mm subpleural right upper lobe lung nodule is seen (axial CT image 63, CT series 6). A stable 7 mm pleural based lung nodule is seen within the posteromedial aspect of the right lower lobe (axial CT image 68, CT series 6). A 4 mm calcified lung nodule is seen within the posterior aspect of the right lung base. No pleural effusion or pneumothorax is identified Upper Abdomen: Multiple surgical clips are seen within the gallbladder fossa. Musculoskeletal: No chest wall abnormality. No acute or significant osseous findings. Review of the MIP images confirms the above findings. IMPRESSION: 1. No evidence of pulmonary embolism. 2. Mild to moderate severity right middle lobe and right lower lobe tree-in-bud appearing infiltrates which may represent sequelae associated with atypical infection. 3. Mild patchy left upper lobe infiltrate. Follow-up to resolution is recommended to exclude the presence of an underlying neoplastic process. 4. Marked severity right middle lobe and right lower  lobe peribronchial thickening likely consistent with marked severity bronchitis. 5. Findings likely consistent with sequelae associated with prior granulomatous disease. 6. Evidence of prior cholecystectomy. 7. Aortic atherosclerosis. Aortic Atherosclerosis (ICD10-I70.0). Electronically Signed   By: Aram Candela M.D.   On: 08/02/2022  03:17   DG Chest 2 View  Result Date: 08/01/2022 CLINICAL DATA:  cough, chest pain EXAM: CHEST - 2 VIEW COMPARISON:  Chest x-ray 07/24/2022 FINDINGS: The heart and mediastinal contours are unchanged. Aortic calcification. Persistent right lower lobe airspace opacity. Persistent left mid lung zone airspace opacity. No pulmonary edema. No pleural effusion. No pneumothorax. No acute osseous abnormality. IMPRESSION: 1. Persistent right lower lobe and mid left lung zone airspace opacity suggestive of infection. Followup PA and lateral chest X-ray is recommended in 3-4 weeks following therapy to ensure resolution and exclude underlying malignancy. 2.  Aortic Atherosclerosis (ICD10-I70.0). Electronically Signed   By: Tish Frederickson M.D.   On: 08/01/2022 21:59    EKG: I independently viewed the EKG done and my findings are as followed: Normal sinus rhythm rate of 92.  Nonspecific ST-T changes.  QTc 479.  Assessment/Plan Present on Admission:  Multifocal pneumonia  Principal Problem:   Multifocal pneumonia  Multifocal pneumonia, POA History of frequent pneumonia, in the setting of pulmonary sarcoidosis on chronic steroid Immunocompromised in the setting of chronic steroid use Failed outpatient therapy. Monitor fever curve and WBCs Obtain MRSA screening test and baseline procalcitonin Obtain sputum culture for ID and sensitivities Continue coverage with broad-spectrum IV antibiotics as initiated in the ED, IV vancomycin and cefepime. Consult pulmonary in the morning  Pulmonary sarcoidosis Resume home regimen Continue home prednisone and restart PPI for GI  prophylaxis Consider pulmonary consult in the morning  Acute hypoxic respiratory failure secondary to above Treat underlying conditions Wean off oxygen supplementation as tolerated. Incentive spirometer Early mobilization Bronchodilators  Elevated troponin, suspect demand ischemia in the setting of acute hypoxic respiratory failure Initial troponin 104, trend No evidence of acute ischemia on twelve-lead EKG Follow 2D echo to rule out any cardiac structural abnormalities  Chronic HFpEF 50- 55% Last 2D echo done on 03/17/2022 revealed LVEF 50-55% with grade 1 diastolic dysfunction. Strict I's and O's and daily weight. BNP 332 Follow 2D echo.  Monitor volume status.  Hyperlipidemia Resume home Zetia and Crestor  Essential hypertension Resume home oral antihypertensives Monitor vital signs  Coronary artery disease Resume home aspirin, Plavix, Zetia, Crestor.  Hypothyroidism Resume home levothyroxine  Type 2 diabetes with hyperglycemia Last hemoglobin A1c 9.4 on 03/17/2022 Heart healthy carb modified diet Start insulin sliding scale.     DVT prophylaxis: Subcu Lovenox daily  Code Status: Full code  Family Communication: Updated his wife at bedside.  Disposition Plan: Admitted to telemetry medical unit  Consults called: Consider pulmonary consultation in the morning.  Admission status: Inpatient status   Status is: Inpatient The patient requires at least 2 midnights for further evaluation and treatment of present condition.   Darlin Drop MD [facility] Pager [phone]  If 7PM-7AM, please contact night-coverage [email or web address] Password [facility]  08/02/2022, 4:26 AM

## 2022-08-03 DIAGNOSIS — G4733 Obstructive sleep apnea (adult) (pediatric): Secondary | ICD-10-CM

## 2022-08-03 DIAGNOSIS — D869 Sarcoidosis, unspecified: Secondary | ICD-10-CM

## 2022-08-03 DIAGNOSIS — Z8639 Personal history of other endocrine, nutritional and metabolic disease: Secondary | ICD-10-CM | POA: Diagnosis not present

## 2022-08-03 DIAGNOSIS — K219 Gastro-esophageal reflux disease without esophagitis: Secondary | ICD-10-CM

## 2022-08-03 DIAGNOSIS — I25118 Atherosclerotic heart disease of native coronary artery with other forms of angina pectoris: Secondary | ICD-10-CM

## 2022-08-03 DIAGNOSIS — J189 Pneumonia, unspecified organism: Secondary | ICD-10-CM | POA: Diagnosis not present

## 2022-08-03 LAB — GLUCOSE, CAPILLARY
Glucose-Capillary: 202 mg/dL — ABNORMAL HIGH (ref 70–99)
Glucose-Capillary: 246 mg/dL — ABNORMAL HIGH (ref 70–99)
Glucose-Capillary: 476 mg/dL — ABNORMAL HIGH (ref 70–99)
Glucose-Capillary: 499 mg/dL — ABNORMAL HIGH (ref 70–99)
Glucose-Capillary: 508 mg/dL (ref 70–99)

## 2022-08-03 LAB — BASIC METABOLIC PANEL
Anion gap: 11 (ref 5–15)
BUN: 20 mg/dL (ref 8–23)
CO2: 28 mmol/L (ref 22–32)
Calcium: 8.1 mg/dL — ABNORMAL LOW (ref 8.9–10.3)
Chloride: 97 mmol/L — ABNORMAL LOW (ref 98–111)
Creatinine, Ser: 1.21 mg/dL (ref 0.61–1.24)
GFR, Estimated: >60 mL/min (ref >60)
Glucose, Bld: 206 mg/dL — ABNORMAL HIGH (ref 70–99)
Potassium: 3.3 mmol/L — ABNORMAL LOW (ref 3.5–5.1)
Sodium: 136 mmol/L (ref 135–145)

## 2022-08-03 LAB — CBC
HCT: 43 % (ref 39.0–52.0)
Hemoglobin: 13.8 g/dL (ref 13.0–17.0)
MCH: 30.7 pg (ref 26.0–34.0)
MCHC: 32.1 g/dL (ref 30.0–36.0)
MCV: 95.8 fL (ref 80.0–100.0)
Platelets: 207 10*3/uL (ref 150–400)
RBC: 4.49 MIL/uL (ref 4.22–5.81)
RDW: 13.5 % (ref 11.5–15.5)
WBC: 10.8 10*3/uL — ABNORMAL HIGH (ref 4.0–10.5)
nRBC: 0 % (ref 0.0–0.2)

## 2022-08-03 LAB — MRSA NEXT GEN BY PCR, NASAL: MRSA by PCR Next Gen: NOT DETECTED

## 2022-08-03 LAB — GLUCOSE, RANDOM: Glucose, Bld: 557 mg/dL (ref 70–99)

## 2022-08-03 LAB — CULTURE, RESPIRATORY W GRAM STAIN: Special Requests: NORMAL

## 2022-08-03 LAB — MAGNESIUM: Magnesium: 1.7 mg/dL (ref 1.7–2.4)

## 2022-08-03 LAB — PHOSPHORUS: Phosphorus: 2.9 mg/dL (ref 2.5–4.6)

## 2022-08-03 LAB — SEDIMENTATION RATE: Sed Rate: 64 mm/hr — ABNORMAL HIGH (ref 0–16)

## 2022-08-03 LAB — C-REACTIVE PROTEIN: CRP: 13.6 mg/dL — ABNORMAL HIGH (ref <1.0)

## 2022-08-03 LAB — CULTURE, BLOOD (ROUTINE X 2): Special Requests: ADEQUATE

## 2022-08-03 MED ORDER — INSULIN ASPART 100 UNIT/ML IJ SOLN: 0.0000 [IU] | Freq: Every day | INTRAMUSCULAR | Status: DC

## 2022-08-03 MED ORDER — INSULIN ASPART PROT & ASPART (70-30 MIX) 100 UNIT/ML ~~LOC~~ SUSP
35.0000 [IU] | Freq: Three times a day (TID) | SUBCUTANEOUS | Status: DC
  Administered 2022-08-04 – 2022-08-05 (×5): 35 [IU] via SUBCUTANEOUS
  Filled 2022-08-03: qty 10

## 2022-08-03 MED ORDER — IPRATROPIUM-ALBUTEROL 0.5-2.5 (3) MG/3ML IN SOLN: 3.0000 mL | Freq: Four times a day (QID) | RESPIRATORY_TRACT | Status: DC

## 2022-08-03 MED ORDER — INSULIN ASPART PROT & ASPART (70-30 MIX) 100 UNIT/ML ~~LOC~~ SUSP: 35.0000 [IU] | Freq: Every day | SUBCUTANEOUS | Status: DC

## 2022-08-03 MED ORDER — INSULIN LISPRO PROT & LISPRO (75-25 MIX) 100 UNIT/ML ~~LOC~~ SUSP
35.0000 [IU] | Freq: Three times a day (TID) | SUBCUTANEOUS | Status: DC
  Filled 2022-08-03: qty 10

## 2022-08-03 MED ORDER — INSULIN ASPART PROT & ASPART (70-30 MIX) 100 UNIT/ML ~~LOC~~ SUSP
35.0000 [IU] | Freq: Three times a day (TID) | SUBCUTANEOUS | Status: DC
  Filled 2022-08-03: qty 10

## 2022-08-03 MED ORDER — IPRATROPIUM-ALBUTEROL 0.5-2.5 (3) MG/3ML IN SOLN
3.0000 mL | Freq: Four times a day (QID) | RESPIRATORY_TRACT | Status: DC
  Administered 2022-08-03 – 2022-08-05 (×8): 3 mL via RESPIRATORY_TRACT
  Filled 2022-08-03 (×9): qty 3

## 2022-08-03 MED ORDER — ACETYLCYSTEINE 20 % IN SOLN
2.0000 mL | Freq: Two times a day (BID) | RESPIRATORY_TRACT | Status: DC
  Administered 2022-08-03 – 2022-08-04 (×3): 2 mL via RESPIRATORY_TRACT
  Filled 2022-08-03 (×5): qty 4

## 2022-08-03 MED ORDER — VANCOMYCIN HCL IN DEXTROSE 1-5 GM/200ML-% IV SOLN
1000.0000 mg | INTRAVENOUS | Status: DC
  Administered 2022-08-04: 1000 mg via INTRAVENOUS
  Filled 2022-08-03: qty 200

## 2022-08-03 MED ORDER — INSULIN ASPART 100 UNIT/ML IJ SOLN
0.0000 [IU] | Freq: Three times a day (TID) | INTRAMUSCULAR | Status: DC
  Administered 2022-08-04: 3 [IU] via SUBCUTANEOUS
  Administered 2022-08-04: 15 [IU] via SUBCUTANEOUS
  Administered 2022-08-05: 4 [IU] via SUBCUTANEOUS

## 2022-08-03 MED ORDER — INSULIN ASPART 100 UNIT/ML IJ SOLN: 6.0000 [IU] | Freq: Three times a day (TID) | INTRAMUSCULAR | Status: DC

## 2022-08-03 MED ORDER — INSULIN ASPART 100 UNIT/ML IJ SOLN
20.0000 [IU] | Freq: Once | INTRAMUSCULAR | Status: AC
  Administered 2022-08-03: 20 [IU] via SUBCUTANEOUS

## 2022-08-03 MED ORDER — FUROSEMIDE 10 MG/ML IJ SOLN
40.0000 mg | Freq: Once | INTRAMUSCULAR | Status: AC
  Administered 2022-08-03: 40 mg via INTRAVENOUS
  Filled 2022-08-03: qty 4

## 2022-08-03 MED ORDER — POTASSIUM CHLORIDE CRYS ER 20 MEQ PO TBCR
40.0000 meq | EXTENDED_RELEASE_TABLET | Freq: Once | ORAL | Status: AC
  Administered 2022-08-03: 40 meq via ORAL
  Filled 2022-08-03: qty 2

## 2022-08-03 MED ORDER — ACETYLCYSTEINE 10% NICU INHALATION SOLUTION
2.0000 mL | Freq: Two times a day (BID) | RESPIRATORY_TRACT | Status: DC
  Filled 2022-08-03: qty 2

## 2022-08-03 NOTE — Inpatient Diabetes Management (Signed)
Inpatient Diabetes Program Recommendations  AACE/ADA: New Consensus Statement on Inpatient Glycemic Control (2015)  Target Ranges:  Prepandial:   less than 140 mg/dL      Peak postprandial:   less than 180 mg/dL (1-2 hours)      Critically ill patients:  140 - 180 mg/dL   Lab Results  Component Value Date   GLUCAP 202 (H) 08/03/2022   HGBA1C 9.4 (H) 03/17/2022    Review of Glycemic Control  Latest Reference Range & Units 08/02/22 08:19 08/02/22 12:46 08/02/22 16:24 08/02/22 20:23 08/03/22 07:20  Glucose-Capillary 70 - 99 mg/dL 161 (H) 096 (H) 045 (H) 400 (H) 202 (H)   Diabetes history: DM  Outpatient Diabetes medications:  70/30 40 units bid with meals  Current orders for Inpatient glycemic control:  Semglee 40 units q HS Novolog 0-9 units tid with meals and HS  Glucerna tid between meals PO prednisone 10 mg Daily  Note glucose trends increase after meal intake.  Inpatient Diabetes Program Recommendations:    May consider adding Novolog meal coverage 6 units tid with meals (hold if patient eats less than 50% or NPO). Will follow.   Thanks,  Christena Deem RN, MSN, BC-ADM Inpatient Diabetes Coordinator Team Pager [phone] (8a-5p)

## 2022-08-03 NOTE — Progress Notes (Signed)
NAME:  [name], MRN:  [id number], DOB:  [birth year], LOS: 1 ADMISSION DATE:  08/01/2022, CONSULTATION DATE: 08/02/2022 REFERRING MD: Dr. Tyson Babinski, CHIEF COMPLAINT: Recurrent pneumonias  History of Present Illness:  Patient with a history of sarcoidosis was chronically been on steroids Has been on 10 mg of prednisone daily for about 10 years now Has always done poorly all the times he has tried to stop steroids recently   Has been treated for pneumonia at least 7-9 times recently with the last one being about a month ago when he used a course of doxycycline and Augmentin   Diagnosed with sarcoidosis about 20 years ago and he was started on steroids then   He used to see Dr. Danise Mina   His primary doctor has been writing his steroid prescriptions   He has a history of type 2 diabetes, hypertension, peripheral vascular disease, pulmonary sarcoidosis, obstructive sleep apnea, coronary artery disease   He did have leukocytosis, hyponatremia   CT scan of the chest showing multifocal infiltrate with some basal scarring worse on the right  Pertinent  Medical History   Past Medical History:  Diagnosis Date   Adrenal insufficiency (HCC)    Allergy    Anemia    Arthritis    feet    Broken foot 09/2019   had to wore a boot. Left    CKD (chronic kidney disease), stage III (HCC)    Coronary artery disease    has stents   Coronary atherosclerosis of native coronary artery    Proximal LAD, posterior lateral stent widely patent-10/12/11   Diabetes mellitus    insulin and pills   Hearing loss    wears hearing aids   History of blood transfusion 06/30/2016   [facility] - 2 units transfused   Hyperlipidemia    Hypertension    OSA (obstructive sleep apnea)    uses VPAC sleep study 2 years done through [facility]. Dr. Anne Fu arranged study   PVD (peripheral vascular disease) (HCC)    Sarcoidosis    Sleep apnea    uses CPAP nightly   Thyroid disease    Type 2 diabetes mellitus (HCC)       Significant Hospital Events: Including procedures, antibiotic start and stop dates in addition to other pertinent events   527-CT scan of the chest reviewed by myself showing basal scarring and new nodular changes at the bases bilaterally  Interim History / Subjective:  Feels breathing is better but not back to baseline Still coughing Chest congestion  Objective   Blood pressure (!) 161/73, pulse 72, temperature 98.5 F (36.9 C), resp. rate 20, weight 96.6 kg, SpO2 96 %.        Intake/Output Summary (Last 24 hours) at 08/03/2022 1356 Last data filed at 08/03/2022 1230 Gross per 24 hour  Intake 644.25 ml  Output 1200 ml  Net -555.75 ml   Filed Weights   08/01/22 2116  Weight: 96.6 kg    Examination: General: Elderly, does not appear to be in distress HENT: Moist oral mucosa Lungs: Coarse breath sounds at the bases Cardiovascular: S1-S2 appreciated Abdomen: Soft, bowel sounds appreciated Extremities: No clubbing, no edema Neuro: Alert and oriented x 3 GU:   Resolved Hospital Problem list     Assessment & Plan:  77 year old gentleman with a history of sarcoidosis was chronically been on steroids for almost 20 years now, for the last 10 years been on 10 mg of prednisone Multiple flares of pneumonia recently in the  last year and a half, as always improved after some treatment with antibiotics Admitted this time around for pneumonia  Current CT consistent with a pneumonia may have some scarring at the bases  He seems to be a little bit better with antibiotics however there is concern for chronic infections of fungal infections especially with chronic steroids -Leukocytosis trended better with antibiotics -It is appropriate to continue antibiotics however if improvement is not maintained then a bronchoscopy may need to be considered to rule out no unusual infections in the context of chronic steroid dependence -Requested Fungitell, Aspergillus antibody, ESR,  CRP  For his sarcoidosis, 1 has to consider transitioning him off steroids or addition of steroid sparing drug of which methotrexate would be a good candidate-needs to be monitored He has not always not done well when he tried to wean off steroids in the past  Hypoxemic respiratory failure -Continue oxygen supplementation  Will need pulmonary function test at some point Will need workup prior to initiating methotrexate  He does have a history of obstructive sleep apnea  History of heart failure with preserved ejection fraction -On diuretics  Virl Diamond, MD [facility] Pager: See Loretha Stapler

## 2022-08-03 NOTE — Progress Notes (Signed)
PROGRESS NOTE    [name]  ZOX:[id number] DOB: 01-Jul-1945 DOA: 08/01/2022 PCP: Johny Blamer, MD    Brief Narrative:  [name] is a 77 y.o. male with past medical history of CAD status post PCI, type 2 diabetes, hypertension, peripheral vascular disease, pulmonary sarcoidosis, obstructive sleep apnea presented to hospital with chest pain and dyspnea and cough.  Patient was recently treated for pneumonia with Augmentin and doxycycline.  States that he has had multiple episodes of flareup with pneumonia since last year.  On this presentation, vitals were stable.  Labs showed leukocytosis, mild hyponatremia at 134, mild hypokalemia at 3.4.  CT angiogram of the chest showed multifocal pneumonia but no PE.  Patient was given broad-spectrum antibiotic and was admitted hospital for further evaluation and treatment.  Patient stated that he used to follow pulmonary Dr. Delford Field in the past but has not followed up with pulmonary for the last 10 years or so.  Assessment and plan  Multifocal pneumonia, POA History of frequent pneumonia, in the setting of pulmonary sarcoidosis on chronic steroid Patient is immunocompromised due to chronic steroids.  Failed outpatient treatment.  BNP slightly elevated.  Procalcitonin less than 0.10.  MRSA screen pending.  Continue IV vancomycin and cefepime.  Sputum culture with gram-negative rods and cocci in chains.  Blood cultures negative in 1 day.  Patient used to see pulmonary Dr. Delford Field in the past but had not had a follow-up in 10 years so pulmonary has been consulted at this time. On prednisone 10 mg at home will continue for now.  Still complains of cough without productive sputum and chest discomfort.  Will add incentive spirometry.  Will give 1 dose of IV Lasix again today.  Pulmonary sarcoidosis On prednisone at home.  Pulmonary has been consulted at this time will follow recommendation.   Acute hypoxic respiratory failure secondary to pneumonia and pulmonary  sarcoidosis. On supplemental oxygen.  Continue incentive spirometry, early mobilization bronchodilators.  Currently on room air.   Elevated troponin, suspect demand ischemia in the setting of acute hypoxic respiratory failure Initial troponin 104. No evidence of acute ischemia on twelve-lead EKG. 2D echocardiogram shows LV ejection fraction of 55 to 60%.   Chronic HFpEF 50- 55% Last 2D echo done on 03/17/2022 revealed LVEF 50-55% with grade 1 diastolic dysfunction.  Continue strict intake and output charting, daily weights.  BNP elevated at 332.  Repeated 2D echocardiogram 08/02/2022 with LV ejection fraction of 55 to 60% with grade 1 diastolic dysfunction.  On oral Lasix at home.  Will give IV Lasix again today.     Hyperlipidemia Continue Zetia and Crestor   Essential hypertension On hydralazine, Lasix and metoprolol 12.5 twice daily home. Will continue.    Coronary artery disease Continue aspirin, Plavix, Zetia, Crestor.   Hypothyroidism Continue Synthroid   Type 2 diabetes mellitus with hyperglycemia Latest hemoglobin A1c 9.4 on 03/17/2022 continue diabetic diet, sliding scale insulin.  On 70/30 insulin at 40 units 3 times daily at home.  Has been started on semglee 40 units at bedtime.  Hypophosphatemia.  Phosphorus level of 2.9.  Has been replenished.   Mild hypokalemia.  I will continue to replenish.  Potassium of 3.3.    DVT prophylaxis: enoxaparin (LOVENOX) injection 40 mg Start: 08/02/22 1600   Code Status:     Code Status: Full Code  Disposition: Home likely 1 to 2 days when clinically improved,  Status is: Inpatient  Remains inpatient appropriate because: Frequent flareups, pulmonary follow-up, IV antibiotics,  Family Communication: Spoke with the patient's wife at bedside.  Consultants:  Pulmonary  Procedures:  None  Antimicrobials:  Vancomycin and cefepime  Anti-infectives (From admission, onward)    Start     Dose/Rate Route Frequency Ordered Stop    08/02/22 1800  vancomycin (VANCOREADY) IVPB 750 mg/150 mL        750 mg 150 mL/hr over 60 Minutes Intravenous Every 12 hours 08/02/22 0624     08/02/22 1200  ceFEPIme (MAXIPIME) 2 g in sodium chloride 0.9 % 100 mL IVPB        2 g 200 mL/hr over 30 Minutes Intravenous Every 8 hours 08/02/22 0624     08/02/22 0645  vancomycin (VANCOREADY) IVPB 2000 mg/400 mL  Status:  Discontinued        2,000 mg 200 mL/hr over 120 Minutes Intravenous Every 24 hours 08/02/22 0552 08/02/22 0624   08/02/22 0645  ceFEPIme (MAXIPIME) 2 g in sodium chloride 0.9 % 100 mL IVPB  Status:  Discontinued        2 g 200 mL/hr over 30 Minutes Intravenous Every 24 hours 08/02/22 0552 08/02/22 0624   08/02/22 0415  vancomycin (VANCOREADY) IVPB 2000 mg/400 mL        2,000 mg 200 mL/hr over 120 Minutes Intravenous  Once 08/02/22 0408 08/02/22 0722   08/02/22 0415  ceFEPIme (MAXIPIME) 2 g in sodium chloride 0.9 % 100 mL IVPB        2 g 200 mL/hr over 30 Minutes Intravenous  Once 08/02/22 0408 08/02/22 0501       Subjective: Today, patient was seen and examined at bedside.  Patient complains of cough and chest pain on coughing.  Denies any nausea, vomiting, fever, chills or rigor.    Objective: Vitals:   08/02/22 2024 08/03/22 0018 08/03/22 0522 08/03/22 0750  BP: (!) 159/73 122/66 (!) 145/64 (!) 122/50  Pulse: 64 60 72 73  Resp: 15 15 15 18   Temp: 97.9 F (36.6 C) 97.8 F (36.6 C) 98.4 F (36.9 C) 99.2 F (37.3 C)  TempSrc: Oral Oral Oral   SpO2: 97% 95% 97% 90%  Weight:        Intake/Output Summary (Last 24 hours) at 08/03/2022 0958 Last data filed at 08/03/2022 0750 Gross per 24 hour  Intake 404.25 ml  Output 850 ml  Net -445.75 ml    Filed Weights   08/01/22 2116  Weight: 96.6 kg    Physical Examination: Body mass index is 30.56 kg/m.   General: Obese built, not in obvious distress, elderly male, on nasal cannula oxygen  HENT:   No scleral pallor or icterus noted. Oral mucosa is moist.  Chest:  Diminished breath sounds bilaterally.  Coarse breath sounds noted.   CVS: S1 &S2 heard. No murmur.  Regular rate and rhythm. Abdomen: Soft, nontender, nondistended.  Bowel sounds are heard.   Extremities: No cyanosis, clubbing or edema.  Peripheral pulses are palpable. Psych: Alert, awake and oriented, normal mood CNS:  No cranial nerve deficits.  Power equal in all extremities.   Skin: Warm and dry.  No rashes noted.  Data Reviewed:   CBC: Recent Labs  Lab 08/01/22 2129 08/02/22 0654 08/03/22 0445  WBC 13.2* 10.7* 10.8*  NEUTROABS 9.6*  --   --   HGB 15.1 12.9* 13.8  HCT 47.3 40.0 43.0  MCV 97.9 96.2 95.8  PLT 246 197 207     Basic Metabolic Panel: Recent Labs  Lab 08/01/22 2129 08/02/22 0654 08/03/22 0445  NA 134* 134* 136  K 3.4* 3.7 3.3*  CL 96* 96* 97*  CO2 25 28 28   GLUCOSE 194* 213* 206*  BUN 14 15 20   CREATININE 1.01 1.05 1.21  CALCIUM 8.3* 7.2* 8.1*  MG  --  1.7 1.7  PHOS  --  2.0* 2.9     Liver Function Tests: No results for input(s): "AST", "ALT", "ALKPHOS", "BILITOT", "PROT", "ALBUMIN" in the last 168 hours.   Radiology Studies: ECHOCARDIOGRAM COMPLETE  Result Date: 08/02/2022    ECHOCARDIOGRAM REPORT   Patient Name:   SEAMON BRUEGGEMANN Date of Exam: 08/02/2022 Medical Rec #:  [id number]  Height:       70.0 in Accession #:    [id number] Weight:       213.0 lb Date of Birth:  Sep 07, 1945  BSA:          2.144 m Patient Age:    76 years   BP:           158/68 mmHg Patient Gender: M          HR:           79 bpm. Exam Location:  Inpatient Procedure: 2D Echo, Cardiac Doppler and Color Doppler Indications:    Elevated Troponin  History:        Patient has prior history of Echocardiogram examinations, most                 recent 03/17/2022. CAD and Angina, Arrythmias:PVC,                 Signs/Symptoms:Chest Pain and Dyspnea; Risk                 Factors:Hypertension, Sleep Apnea and Dyslipidemia. CKD, stage                 3.  Sonographer:    Lucendia Herrlich Referring  Phys: [id number] CAROLE N HALL  Sonographer Comments: Technically difficult study due to poor echo windows. The patient did not cooperate for the full exam IMPRESSIONS  1. Left ventricular ejection fraction, by estimation, is 55 to 60%. The left ventricle has normal function. Left ventricular endocardial border not optimally defined to evaluate regional wall motion. Left ventricular diastolic parameters are consistent with Grade I diastolic dysfunction (impaired relaxation).  2. Right ventricular systolic function is normal. The right ventricular size is normal. Tricuspid regurgitation signal is inadequate for assessing PA pressure.  3. The mitral valve is grossly normal. Trivial mitral valve regurgitation. No evidence of mitral stenosis.  4. The aortic valve is tricuspid. Aortic valve regurgitation is not visualized. Aortic valve sclerosis is present, with no evidence of aortic valve stenosis. FINDINGS  Left Ventricle: Left ventricular ejection fraction, by estimation, is 55 to 60%. The left ventricle has normal function. Left ventricular endocardial border not optimally defined to evaluate regional wall motion. The left ventricular internal cavity size was normal in size. There is no left ventricular hypertrophy. Left ventricular diastolic parameters are consistent with Grade I diastolic dysfunction (impaired relaxation). Right Ventricle: The right ventricular size is normal. No increase in right ventricular wall thickness. Right ventricular systolic function is normal. Tricuspid regurgitation signal is inadequate for assessing PA pressure. Left Atrium: Left atrial size was normal in size. Right Atrium: Right atrial size was not well visualized. Pericardium: Trivial pericardial effusion is present. Presence of epicardial fat layer. Mitral Valve: The mitral valve is grossly normal. Trivial mitral valve regurgitation. No evidence of mitral valve stenosis. Tricuspid Valve: The  tricuspid valve is grossly normal.  Tricuspid valve regurgitation is trivial. No evidence of tricuspid stenosis. Aortic Valve: The aortic valve is tricuspid. Aortic valve regurgitation is not visualized. Aortic valve sclerosis is present, with no evidence of aortic valve stenosis. Aortic valve peak gradient measures 12.0 mmHg. Pulmonic Valve: The pulmonic valve was grossly normal. Pulmonic valve regurgitation is not visualized. No evidence of pulmonic stenosis. Aorta: The aortic root and ascending aorta are structurally normal, with no evidence of dilitation. Venous: The inferior vena cava was not well visualized. IAS/Shunts: The interatrial septum was not well visualized.  LEFT VENTRICLE PLAX 2D LVIDd:         5.60 cm   Diastology LVIDs:         4.50 cm   LV e' medial:    5.35 cm/s LV PW:         1.10 cm   LV E/e' medial:  10.6 LV IVS:        1.00 cm   LV e' lateral:   6.39 cm/s LVOT diam:     2.00 cm   LV E/e' lateral: 8.9 LV SV:         72 LV SV Index:   34 LVOT Area:     3.14 cm  RIGHT VENTRICLE RV S prime:     12.50 cm/s TAPSE (M-mode): 1.9 cm LEFT ATRIUM             Index        RIGHT ATRIUM           Index LA diam:        3.50 cm 1.63 cm/m   RA Area:     14.70 cm LA Vol (A2C):   36.6 ml 17.07 ml/m  RA Volume:   37.40 ml  17.44 ml/m LA Vol (A4C):   50.8 ml 23.69 ml/m LA Biplane Vol: 41.5 ml 19.36 ml/m  AORTIC VALVE AV Area (Vmax): 1.97 cm AV Vmax:        173.00 cm/s AV Peak Grad:   12.0 mmHg LVOT Vmax:      108.67 cm/s LVOT Vmean:     72.100 cm/s LVOT VTI:       0.229 m  AORTA Ao Root diam: 3.50 cm Ao Asc diam:  3.20 cm MITRAL VALVE MV Area (PHT): 3.08 cm    SHUNTS MV Decel Time: 246 msec    Systemic VTI:  0.23 m MV E velocity: 56.90 cm/s  Systemic Diam: 2.00 cm MV A velocity: 86.30 cm/s MV E/A ratio:  0.66 Lennie Odor MD Electronically signed by Lennie Odor MD Signature Date/Time: 08/02/2022/2:29:42 PM    Final    CT Angio Chest Pulmonary Embolism (PE) W or WO Contrast  Result Date: 08/02/2022 CLINICAL DATA:  Worsening cough and  chest tightness. EXAM: CT ANGIOGRAPHY CHEST WITH CONTRAST TECHNIQUE: Multidetector CT imaging of the chest was performed using the standard protocol during bolus administration of intravenous contrast. Multiplanar CT image reconstructions and MIPs were obtained to evaluate the vascular anatomy. RADIATION DOSE REDUCTION: This exam was performed according to the departmental dose-optimization program which includes automated exposure control, adjustment of the mA and/or kV according to patient size and/or use of iterative reconstruction technique. CONTRAST:  75mL OMNIPAQUE IOHEXOL 350 MG/ML SOLN COMPARISON:  March 15, 2022 FINDINGS: Cardiovascular: There is mild calcification of the thoracic aorta, without evidence of aortic aneurysm. Satisfactory opacification of the pulmonary arteries to the segmental level. No evidence of pulmonary embolism. Normal heart size with marked severity  coronary artery calcification. Coronary artery stent is in place. No pericardial effusion. Mediastinum/Nodes: Small left hilar and right infrahilar calcified lymph nodes are seen. Thyroid gland, trachea, and esophagus demonstrate no significant findings. Lungs/Pleura: A small area of patchy infiltrate is seen within the posterolateral aspect of the left upper lobe. Mild to moderate severity tree-in-bud appearing infiltrates are also seen within the right middle lobe and right lower lobe. Marked severity right middle lobe and right lower lobe peribronchial thickening is also noted. A stable 4 mm subpleural right upper lobe lung nodule is seen (axial CT image 63, CT series 6). A stable 7 mm pleural based lung nodule is seen within the posteromedial aspect of the right lower lobe (axial CT image 68, CT series 6). A 4 mm calcified lung nodule is seen within the posterior aspect of the right lung base. No pleural effusion or pneumothorax is identified Upper Abdomen: Multiple surgical clips are seen within the gallbladder fossa.  Musculoskeletal: No chest wall abnormality. No acute or significant osseous findings. Review of the MIP images confirms the above findings. IMPRESSION: 1. No evidence of pulmonary embolism. 2. Mild to moderate severity right middle lobe and right lower lobe tree-in-bud appearing infiltrates which may represent sequelae associated with atypical infection. 3. Mild patchy left upper lobe infiltrate. Follow-up to resolution is recommended to exclude the presence of an underlying neoplastic process. 4. Marked severity right middle lobe and right lower lobe peribronchial thickening likely consistent with marked severity bronchitis. 5. Findings likely consistent with sequelae associated with prior granulomatous disease. 6. Evidence of prior cholecystectomy. 7. Aortic atherosclerosis. Aortic Atherosclerosis (ICD10-I70.0). Electronically Signed   By: Aram Candela M.D.   On: 08/02/2022 03:17   DG Chest 2 View  Result Date: 08/01/2022 CLINICAL DATA:  cough, chest pain EXAM: CHEST - 2 VIEW COMPARISON:  Chest x-ray 07/24/2022 FINDINGS: The heart and mediastinal contours are unchanged. Aortic calcification. Persistent right lower lobe airspace opacity. Persistent left mid lung zone airspace opacity. No pulmonary edema. No pleural effusion. No pneumothorax. No acute osseous abnormality. IMPRESSION: 1. Persistent right lower lobe and mid left lung zone airspace opacity suggestive of infection. Followup PA and lateral chest X-ray is recommended in 3-4 weeks following therapy to ensure resolution and exclude underlying malignancy. 2.  Aortic Atherosclerosis (ICD10-I70.0). Electronically Signed   By: Tish Frederickson M.D.   On: 08/01/2022 21:59      LOS: 1 day     Joycelyn Das, MD [facility] Available via Epic secure chat 7am-7pm After these hours, please refer to coverage provider listed on [email or web address] 08/03/2022, 9:58 AM

## 2022-08-03 NOTE — TOC Initial Note (Signed)
Transition of Care [facility]) - Initial/Assessment Note    Patient Details  Name: [name] MRN: [id number] Date of Birth: 01-26-1946  [facility] CM/SW Contact:    Janae Bridgeman, RN Phone Number: 08/03/2022, 2:56 PM  Clinical Narrative:                 Patient was admitted from home with wife, Jasmine December.  The patient was admitted for simple pneumonia.  Patient remains on room air.  No TOC needs determined at this time.  CM will continue to follow the patient.  Patient will likely discharge home with family when medically stable to discharge to home.  PCP follow up - Dr. Johny Blamer.        Patient Goals and CMS Choice            Expected Discharge Plan and Services                                              Prior Living Arrangements/Services                       Activities of Daily Living      Permission Sought/Granted                  Emotional Assessment              Admission diagnosis:  Multifocal pneumonia [J18.9] Patient Active Problem List   Diagnosis Date Noted   Multifocal pneumonia 08/02/2022   Severe sepsis (HCC) 03/16/2022   Right upper lobe pneumonia 03/16/2022   Cellulitis of left lower extremity 03/15/2022   HTN (hypertension) 10/20/2021   Chest pain of uncertain etiology    Frequent PVCs    Acute renal failure superimposed on stage 3a chronic kidney disease (HCC) 10/19/2021   Lobar pneumonia (HCC) 10/07/2021   CAP (community acquired pneumonia) 10/01/2021   Back pain 09/23/2021   Herniated lumbar intervertebral disc 09/23/2021   Lumbar radiculopathy, acute 09/14/2021   Azotemia 09/14/2021   Intractable low back pain 09/13/2021   Acute encephalopathy 09/13/2021   Bandemia 09/13/2021   Acute respiratory failure with hypoxia (HCC) 09/13/2021   DOE (dyspnea on exertion) 12/25/2020   Gastric polyps    GI bleed 01/01/2020   Osteomyelitis (HCC) 01/01/2020   Charcot's joint, left ankle  and foot 11/06/2019   Peripheral arterial disease (HCC) 07/12/2019   Pain in right elbow 10/30/2018   AKI (acute kidney injury) (HCC) 10/03/2018   Hypoalbuminemia 10/03/2018   Hypokalemia 10/03/2018   Hypothyroidism 10/03/2018   Stage 3a chronic kidney disease (CKD) (HCC)    Other iron deficiency anemias    Normocytic anemia 06/29/2016   Chronic diastolic heart failure (HCC) 01/14/2014   Stable angina 01/14/2014   Obesity 01/14/2014   Pulmonary sarcoidosis (HCC) 01/14/2014   Uncontrolled type 2 diabetes mellitus with hyperglycemia, with long-term current use of insulin (HCC) 01/14/2014   Hyperlipidemia 01/14/2014   Coronary atherosclerosis of native coronary artery    Sinusitis, chronic 05/30/2011   OSA (obstructive sleep apnea) 10/09/2008   History of adrenal insufficiency 02/21/2008   Coronary artery disease with exertional angina (HCC) 11/20/2007   Sarcoidosis 02/20/2007   GERD 12/20/2006   PCP:  Johny Blamer, MD Pharmacy:   [id number] - [place], [place] - [address] [place] [address] Phone:  [phone] Fax: [phone]  [facility] - [place], [place] - [address] [place] [address] Phone: [phone] Fax: [phone]     Social Determinants of Health (SDOH) Social History: SDOH Screenings   Food Insecurity: No Food Insecurity (10/21/2021)  Housing: Low Risk  (10/21/2021)  Transportation Needs: No Transportation Needs (10/21/2021)  Tobacco Use: Low Risk  (08/01/2022)   SDOH Interventions:     Readmission Risk Interventions    03/18/2022    9:07 AM  Readmission Risk Prevention Plan  Transportation Screening Complete  Medication Review (RN Care Manager) Complete  PCP or Specialist appointment within 3-5 days of discharge Complete  HRI or Home Care Consult Complete  SW Recovery Care/Counseling Consult Complete  Palliative Care Screening Complete  Skilled Nursing  Facility Complete

## 2022-08-03 NOTE — Progress Notes (Signed)
Pharmacy Antibiotic Note  [name] is a 77 y.o. male admitted on 08/01/2022 with pneumonia.  Pharmacy has been consulted for vancomycin and cefepime dosing.  Scr increase to 1.21. We will adjust vanc slightly. MRSA PCR not done yet.   Plan: Change vanc 1g IV q24  Expected AUC 422, scr 1.21 Cefepime 2g IV Q8H MRSA PCR  Weight: 96.6 kg (213 lb)  Temp (24hrs), Avg:98.3 F (36.8 C), Min:97.8 F (36.6 C), Max:99.2 F (37.3 C)  Recent Labs  Lab 08/01/22 2129 08/02/22 0001 08/02/22 0327 08/02/22 0654 08/03/22 0445  WBC 13.2*  --   --  10.7* 10.8*  CREATININE 1.01  --   --  1.05 1.21  LATICACIDVEN  --  1.3 1.2  --   --      Estimated Creatinine Clearance: 60.5 mL/min (by C-G formula based on SCr of 1.21 mg/dL).    Allergies  Allergen Reactions   Statins Other (See Comments)    Aggression  Pt ok to take crestor 20mg  once a week   Hydrocortisone Nausea Only   Ranexa [Ranolazine] Other (See Comments)    Severe weakness/near syncope after 1 dose Hallucinations    Doxycycline Diarrhea   Hydrocodone Nausea And Vomiting   Miralax [Polyethylene Glycol] Nausea And Vomiting   Z-Pak [Azithromycin] Other (See Comments)    Raises blood sugar   Zaroxolyn [Metolazone] Other (See Comments)    Drained, no energy    5/28 vanc>> 5/28 cefepime>>   5/28 blood>> 5/28 resp>>GNR/GPC on gram stain  Ulyses Southward, PharmD, BCIDP, AAHIVP, CPP Infectious Disease Pharmacist 08/03/2022 9:35 AM

## 2022-08-03 NOTE — Progress Notes (Signed)
Patient hyperglycemic all day despite appropriate coverage. Contacted by nursing POCT 503. Reports patient states only 70/30 works for him. Patient placed on his home dosage of 35units TID. Lantus 40units d/ced

## 2022-08-04 DIAGNOSIS — K219 Gastro-esophageal reflux disease without esophagitis: Secondary | ICD-10-CM | POA: Diagnosis not present

## 2022-08-04 DIAGNOSIS — J189 Pneumonia, unspecified organism: Secondary | ICD-10-CM | POA: Diagnosis not present

## 2022-08-04 DIAGNOSIS — Z8639 Personal history of other endocrine, nutritional and metabolic disease: Secondary | ICD-10-CM | POA: Diagnosis not present

## 2022-08-04 DIAGNOSIS — I25118 Atherosclerotic heart disease of native coronary artery with other forms of angina pectoris: Secondary | ICD-10-CM | POA: Diagnosis not present

## 2022-08-04 LAB — BASIC METABOLIC PANEL
Anion gap: 10 (ref 5–15)
BUN: 17 mg/dL (ref 8–23)
CO2: 28 mmol/L (ref 22–32)
Calcium: 8 mg/dL — ABNORMAL LOW (ref 8.9–10.3)
Chloride: 99 mmol/L (ref 98–111)
Creatinine, Ser: 1.23 mg/dL (ref 0.61–1.24)
GFR, Estimated: >60 mL/min (ref >60)
Glucose, Bld: 123 mg/dL — ABNORMAL HIGH (ref 70–99)
Potassium: 3 mmol/L — ABNORMAL LOW (ref 3.5–5.1)
Sodium: 137 mmol/L (ref 135–145)

## 2022-08-04 LAB — GLUCOSE, CAPILLARY
Glucose-Capillary: 102 mg/dL — ABNORMAL HIGH (ref 70–99)
Glucose-Capillary: 114 mg/dL — ABNORMAL HIGH (ref 70–99)
Glucose-Capillary: 122 mg/dL — ABNORMAL HIGH (ref 70–99)
Glucose-Capillary: 131 mg/dL — ABNORMAL HIGH (ref 70–99)
Glucose-Capillary: 230 mg/dL — ABNORMAL HIGH (ref 70–99)
Glucose-Capillary: 306 mg/dL — ABNORMAL HIGH (ref 70–99)

## 2022-08-04 LAB — CULTURE, BLOOD (ROUTINE X 2): Culture: NO GROWTH

## 2022-08-04 LAB — CULTURE, RESPIRATORY W GRAM STAIN: Culture: NORMAL

## 2022-08-04 MED ORDER — ADULT MULTIVITAMIN W/MINERALS CH: 1.0000 | ORAL_TABLET | Freq: Every day | ORAL | Status: DC

## 2022-08-04 MED ORDER — POTASSIUM CHLORIDE CRYS ER 20 MEQ PO TBCR
40.0000 meq | EXTENDED_RELEASE_TABLET | ORAL | Status: AC
  Administered 2022-08-04 (×2): 40 meq via ORAL
  Filled 2022-08-04 (×2): qty 2

## 2022-08-04 MED ORDER — LEVOFLOXACIN 500 MG PO TABS
500.0000 mg | ORAL_TABLET | Freq: Every day | ORAL | Status: DC
  Administered 2022-08-04 – 2022-08-05 (×2): 500 mg via ORAL
  Filled 2022-08-04 (×2): qty 1

## 2022-08-04 MED ORDER — FUROSEMIDE 10 MG/ML IJ SOLN
40.0000 mg | Freq: Once | INTRAMUSCULAR | Status: AC
  Administered 2022-08-04: 40 mg via INTRAVENOUS
  Filled 2022-08-04: qty 4

## 2022-08-04 NOTE — Progress Notes (Addendum)
PROGRESS NOTE    [name]  ZOX:[id number] DOB: [birth year] DOA: 08/01/2022 PCP: Johny Blamer, MD    Brief Narrative:   [name] is a 77 y.o. male with past medical history of CAD status post PCI, type 2 diabetes, hypertension, peripheral vascular disease, pulmonary sarcoidosis, obstructive sleep apnea presented to hospital with chest pain and dyspnea and cough.  Patient was recently treated for pneumonia with Augmentin and doxycycline.  States that he has had multiple episodes of flareup with pneumonia since last year.  On this presentation, vitals were stable.  Labs showed leukocytosis, mild hyponatremia at 134, mild hypokalemia at 3.4.  CT angiogram of the chest showed multifocal pneumonia but no PE.  Patient was given broad-spectrum antibiotic and was admitted hospital for further evaluation and treatment.  Patient stated that he used to follow pulmonary Dr. Delford Field in the past but has not followed up with pulmonary for the last 10 years or so.  Assessment and plan  Multifocal pneumonia, POA History of frequent pneumonia, in the setting of pulmonary sarcoidosis on chronic steroid Patient is immunocompromised due to chronic steroids.  Failed outpatient treatment.  BNP slightly elevated.  Procalcitonin less than 0.10.  MRSA screen not detected.  Patient was initially on IV vancomycin and cefepime.  Sputum culture with gram-negative rods and cocci in chains.  Blood cultures negative in 2 days.  Patient used to see pulmonary Dr. Delford Field in the past but had not had a follow-up in 10 years so pulmonary been consulted at this time.  Plan is to change antibiotic to Levaquin at this time.  On prednisone 10 mg at home will continue for now.  Continue incentive spirometry.    Pulmonary sarcoidosis On prednisone at home.  Pulmonary has been consulted at this time plan is to transition to methotrexate at some point with outpatient pulmonary follow-up.   Acute hypoxic respiratory failure secondary to  pneumonia and pulmonary sarcoidosis. On room air.  Continue incentive spirometry, early mobilization bronchodilators.  Encouraged ambulation.   Elevated troponin, suspect demand ischemia in the setting of acute hypoxic respiratory failure Initial troponin 104. No evidence of acute ischemia on twelve-lead EKG. 2D echocardiogram shows LV ejection fraction of 55 to 60%.   Chronic HFpEF 50- 55% Last 2D echo done on 03/17/2022 revealed LVEF 50-55% with grade 1 diastolic dysfunction.  Continue strict intake and output charting, daily weights.  Patient is negative balance for 1237 mL.  BNP elevated at 332.  Repeated 2D echocardiogram 08/02/2022 with LV ejection fraction of 55 to 60% with grade 1 diastolic dysfunction.  On oral Lasix at home.  Will again give her 1 dose of IV Lasix today.   Hyperlipidemia Continue Zetia and Crestor   Essential hypertension On hydralazine, Lasix and metoprolol 12.5 twice daily home. Will continue.    Coronary artery disease Continue aspirin, Plavix, Zetia, Crestor.   Hypothyroidism Continue Synthroid   Type 2 diabetes mellitus with hyperglycemia Latest hemoglobin A1c 9.4 on 03/17/2022 continue diabetic diet, sliding scale insulin.  On 70/30 insulin at 40 units 3 times daily at home.  Has been started on 35 units of NovoLog 3 times daily with meals with improvement in blood glucose level.  Patient also on sliding scale insulin.  Hypophosphatemia.  Phosphorus level of 2.9.  Has been replenished.  Mild hypokalemia.   Potassium of 3.0.  Will continue to replenish orally.  Recheck BMP in AM..    DVT prophylaxis: enoxaparin (LOVENOX) injection 40 mg Start: 08/02/22 1600  Code Status:     Code Status: Full Code  Disposition: Home likely 1 to 2 days when clinically improved and okay with pulmonary.,  Status is: Inpatient  Remains inpatient appropriate because: Frequent flareups, pulmonary follow-up, IV antibiotics,   Family Communication: Spoke with the  patient's wife at bedside.  Consultants:  Pulmonary  Procedures:  None  Antimicrobials:  Levaquin starting 08/04/2022  Anti-infectives (From admission, onward)    Start     Dose/Rate Route Frequency Ordered Stop   08/04/22 1200  levofloxacin (LEVAQUIN) tablet 500 mg        500 mg Oral Daily 08/04/22 1108 08/11/22 0959   08/04/22 0200  vancomycin (VANCOCIN) IVPB 1000 mg/200 mL premix  Status:  Discontinued        1,000 mg 200 mL/hr over 60 Minutes Intravenous Every 24 hours 08/03/22 1102 08/04/22 1108   08/02/22 1800  vancomycin (VANCOREADY) IVPB 750 mg/150 mL  Status:  Discontinued        750 mg 150 mL/hr over 60 Minutes Intravenous Every 12 hours 08/02/22 0624 08/03/22 1102   08/02/22 1200  ceFEPIme (MAXIPIME) 2 g in sodium chloride 0.9 % 100 mL IVPB  Status:  Discontinued        2 g 200 mL/hr over 30 Minutes Intravenous Every 8 hours 08/02/22 0624 08/04/22 1108   08/02/22 0645  vancomycin (VANCOREADY) IVPB 2000 mg/400 mL  Status:  Discontinued        2,000 mg 200 mL/hr over 120 Minutes Intravenous Every 24 hours 08/02/22 0552 08/02/22 0624   08/02/22 0645  ceFEPIme (MAXIPIME) 2 g in sodium chloride 0.9 % 100 mL IVPB  Status:  Discontinued        2 g 200 mL/hr over 30 Minutes Intravenous Every 24 hours 08/02/22 0552 08/02/22 0624   08/02/22 0415  vancomycin (VANCOREADY) IVPB 2000 mg/400 mL        2,000 mg 200 mL/hr over 120 Minutes Intravenous  Once 08/02/22 0408 08/02/22 0722   08/02/22 0415  ceFEPIme (MAXIPIME) 2 g in sodium chloride 0.9 % 100 mL IVPB        2 g 200 mL/hr over 30 Minutes Intravenous  Once 08/02/22 0408 08/02/22 0501       Subjective: Today, patient was seen and examined at bedside.  Patient denies any nausea, vomiting, fever, chills or rigor.  Complains of cough and chest discomfort.  States that he could not tolerate the nebulizer too well.  Objective: Vitals:   08/04/22 0717 08/04/22 0814 08/04/22 0900 08/04/22 1134  BP: 124/64     Pulse: (!) 48   64   Resp:      Temp:      TempSrc:      SpO2: 97% 96%  97%  Weight:        Intake/Output Summary (Last 24 hours) at 08/04/2022 1148 Last data filed at 08/04/2022 0500 Gross per 24 hour  Intake 472 ml  Output 1400 ml  Net -928 ml    Filed Weights   08/01/22 2116  Weight: 96.6 kg    Physical Examination: Body mass index is 30.56 kg/m.   General: Elderly male, not in obvious distress, obese, on room air, HENT:   No scleral pallor or icterus noted. Oral mucosa is moist.  Chest: Diminished breath sounds bilaterally.  Coarse breath sounds noted with expiratory wheezing, CVS: S1 &S2 heard. No murmur.  Regular rate and rhythm. Abdomen: Soft, nontender, nondistended.  Bowel sounds are heard.   Extremities: No cyanosis, clubbing  trace edema.  Peripheral pulses are palpable. Psych: Alert, awake and oriented, normal mood CNS:  No cranial nerve deficits.  Power equal in all extremities.   Skin: Warm and dry.  No rashes noted.  Data Reviewed:   CBC: Recent Labs  Lab 08/01/22 2129 08/02/22 0654 08/03/22 0445  WBC 13.2* 10.7* 10.8*  NEUTROABS 9.6*  --   --   HGB 15.1 12.9* 13.8  HCT 47.3 40.0 43.0  MCV 97.9 96.2 95.8  PLT 246 197 207     Basic Metabolic Panel: Recent Labs  Lab 08/01/22 2129 08/02/22 0654 08/03/22 0445 08/03/22 1713 08/04/22 0359  NA 134* 134* 136  --  137  K 3.4* 3.7 3.3*  --  3.0*  CL 96* 96* 97*  --  99  CO2 25 28 28   --  28  GLUCOSE 194* 213* 206* 557* 123*  BUN 14 15 20   --  17  CREATININE 1.01 1.05 1.21  --  1.23  CALCIUM 8.3* 7.2* 8.1*  --  8.0*  MG  --  1.7 1.7  --   --   PHOS  --  2.0* 2.9  --   --      Liver Function Tests: No results for input(s): "AST", "ALT", "ALKPHOS", "BILITOT", "PROT", "ALBUMIN" in the last 168 hours.   Radiology Studies: ECHOCARDIOGRAM COMPLETE  Result Date: 08/02/2022    ECHOCARDIOGRAM REPORT   Patient Name:   ARTHEL CURREN Date of Exam: 08/02/2022 Medical Rec #:  [id number]  Height:       70.0 in Accession  #:    [id number] Weight:       213.0 lb Date of Birth:  08-Dec-1945  BSA:          2.144 m Patient Age:    76 years   BP:           158/68 mmHg Patient Gender: M          HR:           79 bpm. Exam Location:  Inpatient Procedure: 2D Echo, Cardiac Doppler and Color Doppler Indications:    Elevated Troponin  History:        Patient has prior history of Echocardiogram examinations, most                 recent 03/17/2022. CAD and Angina, Arrythmias:PVC,                 Signs/Symptoms:Chest Pain and Dyspnea; Risk                 Factors:Hypertension, Sleep Apnea and Dyslipidemia. CKD, stage                 3.  Sonographer:    Lucendia Herrlich Referring Phys: [id number] CAROLE N HALL  Sonographer Comments: Technically difficult study due to poor echo windows. The patient did not cooperate for the full exam IMPRESSIONS  1. Left ventricular ejection fraction, by estimation, is 55 to 60%. The left ventricle has normal function. Left ventricular endocardial border not optimally defined to evaluate regional wall motion. Left ventricular diastolic parameters are consistent with Grade I diastolic dysfunction (impaired relaxation).  2. Right ventricular systolic function is normal. The right ventricular size is normal. Tricuspid regurgitation signal is inadequate for assessing PA pressure.  3. The mitral valve is grossly normal. Trivial mitral valve regurgitation. No evidence of mitral stenosis.  4. The aortic valve is tricuspid. Aortic valve regurgitation is not visualized. Aortic valve sclerosis  is present, with no evidence of aortic valve stenosis. FINDINGS  Left Ventricle: Left ventricular ejection fraction, by estimation, is 55 to 60%. The left ventricle has normal function. Left ventricular endocardial border not optimally defined to evaluate regional wall motion. The left ventricular internal cavity size was normal in size. There is no left ventricular hypertrophy. Left ventricular diastolic parameters are consistent with Grade  I diastolic dysfunction (impaired relaxation). Right Ventricle: The right ventricular size is normal. No increase in right ventricular wall thickness. Right ventricular systolic function is normal. Tricuspid regurgitation signal is inadequate for assessing PA pressure. Left Atrium: Left atrial size was normal in size. Right Atrium: Right atrial size was not well visualized. Pericardium: Trivial pericardial effusion is present. Presence of epicardial fat layer. Mitral Valve: The mitral valve is grossly normal. Trivial mitral valve regurgitation. No evidence of mitral valve stenosis. Tricuspid Valve: The tricuspid valve is grossly normal. Tricuspid valve regurgitation is trivial. No evidence of tricuspid stenosis. Aortic Valve: The aortic valve is tricuspid. Aortic valve regurgitation is not visualized. Aortic valve sclerosis is present, with no evidence of aortic valve stenosis. Aortic valve peak gradient measures 12.0 mmHg. Pulmonic Valve: The pulmonic valve was grossly normal. Pulmonic valve regurgitation is not visualized. No evidence of pulmonic stenosis. Aorta: The aortic root and ascending aorta are structurally normal, with no evidence of dilitation. Venous: The inferior vena cava was not well visualized. IAS/Shunts: The interatrial septum was not well visualized.  LEFT VENTRICLE PLAX 2D LVIDd:         5.60 cm   Diastology LVIDs:         4.50 cm   LV e' medial:    5.35 cm/s LV PW:         1.10 cm   LV E/e' medial:  10.6 LV IVS:        1.00 cm   LV e' lateral:   6.39 cm/s LVOT diam:     2.00 cm   LV E/e' lateral: 8.9 LV SV:         72 LV SV Index:   34 LVOT Area:     3.14 cm  RIGHT VENTRICLE RV S prime:     12.50 cm/s TAPSE (M-mode): 1.9 cm LEFT ATRIUM             Index        RIGHT ATRIUM           Index LA diam:        3.50 cm 1.63 cm/m   RA Area:     14.70 cm LA Vol (A2C):   36.6 ml 17.07 ml/m  RA Volume:   37.40 ml  17.44 ml/m LA Vol (A4C):   50.8 ml 23.69 ml/m LA Biplane Vol: 41.5 ml 19.36 ml/m   AORTIC VALVE AV Area (Vmax): 1.97 cm AV Vmax:        173.00 cm/s AV Peak Grad:   12.0 mmHg LVOT Vmax:      108.67 cm/s LVOT Vmean:     72.100 cm/s LVOT VTI:       0.229 m  AORTA Ao Root diam: 3.50 cm Ao Asc diam:  3.20 cm MITRAL VALVE MV Area (PHT): 3.08 cm    SHUNTS MV Decel Time: 246 msec    Systemic VTI:  0.23 m MV E velocity: 56.90 cm/s  Systemic Diam: 2.00 cm MV A velocity: 86.30 cm/s MV E/A ratio:  0.66 Lennie Odor MD Electronically signed by Lennie Odor MD Signature Date/Time: 08/02/2022/2:29:42 PM  Final       LOS: 2 days    Joycelyn Das, MD [facility] Available via Epic secure chat 7am-7pm After these hours, please refer to coverage provider listed on [email or web address] 08/04/2022, 11:48 AM

## 2022-08-04 NOTE — Progress Notes (Addendum)
NAME:  [name], MRN:  [id number], DOB:  1945/08/13, LOS: 2 ADMISSION DATE:  08/01/2022, CONSULTATION DATE: 08/02/2022 REFERRING MD: Dr. Tyson Babinski, CHIEF COMPLAINT: Recurrent pneumonias  History of Present Illness:  Patient with a history of sarcoidosis was chronically been on steroids Has been on 10 mg of prednisone daily for about 10 years now Has always done poorly all the times he has tried to stop steroids recently   Has been treated for pneumonia at least 7-9 times recently with the last one being about a month ago when he used a course of doxycycline and Augmentin   Diagnosed with sarcoidosis about 20 years ago and he was started on steroids then   He used to see Dr. Danise Mina   His primary doctor has been writing his steroid prescriptions   He has a history of type 2 diabetes, hypertension, peripheral vascular disease, pulmonary sarcoidosis, obstructive sleep apnea, coronary artery disease   He did have leukocytosis, hyponatremia   CT scan of the chest showing multifocal infiltrate with some basal scarring worse on the right  Pertinent  Medical History   Past Medical History:  Diagnosis Date   Adrenal insufficiency (HCC)    Allergy    Anemia    Arthritis    feet    Broken foot 09/2019   had to wore a boot. Left    CKD (chronic kidney disease), stage III (HCC)    Coronary artery disease    has stents   Coronary atherosclerosis of native coronary artery    Proximal LAD, posterior lateral stent widely patent-10/12/11   Diabetes mellitus    insulin and pills   Hearing loss    wears hearing aids   History of blood transfusion 06/30/2016   [facility] - 2 units transfused   Hyperlipidemia    Hypertension    OSA (obstructive sleep apnea)    uses VPAC sleep study 2 years done through [place]. Dr. Anne Fu arranged study   PVD (peripheral vascular disease) (HCC)    Sarcoidosis    Sleep apnea    uses CPAP nightly   Thyroid disease    Type 2 diabetes mellitus (HCC)       Significant Hospital Events: Including procedures, antibiotic start and stop dates in addition to other pertinent events   527-CT scan of the chest reviewed by myself showing basal scarring and new nodular changes at the bases bilaterally  Interim History / Subjective:  Describes feeling a little bit better Still having coughing fits Mucomyst added yesterday Was able to ambulate a bit yesterday  Objective   Blood pressure 124/64, pulse 64, temperature 98.5 F (36.9 C), temperature source Oral, resp. rate 17, weight 96.6 kg, SpO2 96 %.        Intake/Output Summary (Last 24 hours) at 08/04/2022 1059 Last data filed at 08/04/2022 0500 Gross per 24 hour  Intake 472 ml  Output 1400 ml  Net -928 ml   Filed Weights   08/01/22 2116  Weight: 96.6 kg    Examination: General: Elderly gentleman, does not appear to be in distress HENT: Moist oral mucosa Lungs: Still with coarse breath sounds Cardiovascular: S1-S2 appreciated Abdomen:, Bowel sounds appreciated Extremities: No clubbing, no edema Neuro: Alert and oriented x 3 GU:   CRP 13.6 ESR pending Fungitell results pending Aspergillus antibody testing-result pending  Resolved Hospital Problem list     Assessment & Plan:  77 year old gentleman with a history of sarcoidosis was chronically been on steroids for almost 20  years now, for the last 10 years been on 10 mg of prednisone Multiple flares of pneumonia recently in the last year and a half, as always improved after some treatment with antibiotics Admitted this time around for pneumonia  Current CT consistent with a pneumonia may have some scarring at the bases  Seems a little bit better with antibiotics however concern for chronic infections of fungal infections especially with use of chronic steroids  Did request for Fungitell, Aspergillus antibody, ESR, CRP  Bronchoscopy discussed as an option if he does not continue to improve however it does seem to be  stabilizing  For his chronic history of sarcoidosis that he has been on steroids for over 20 years now, goal will be to try and wean him down in a gradual fashion -Attempt to decrease steroids to about 5 mg left him with significant deconditioning, I think he may have significant muscle weakness from chronic steroids -I encouraged him to continue to try and ambulate as best as able, he does have a bad back that he uses a walker for  As he is being weaned from steroids, may be started on methotrexate with close monitoring  Not having any fevers, no significant leukocytosis, sputum culture unrevealing so far with rare gram-positive , gram-negative -Will change to Levaquin, noted to be intolerance to doxycycline and azithromycin, MRSA PCR negative -Levaquin 500 for 7 days  Hypoxemic respiratory failure -Was able to wean off oxygen  Will need pulmonary function test at some point Will need workup prior to initiating methotrexate  He does have a history of obstructive sleep apnea  History of heart failure with preserved ejection fraction -On diuretics

## 2022-08-05 DIAGNOSIS — J189 Pneumonia, unspecified organism: Secondary | ICD-10-CM | POA: Diagnosis not present

## 2022-08-05 DIAGNOSIS — Z8639 Personal history of other endocrine, nutritional and metabolic disease: Secondary | ICD-10-CM | POA: Diagnosis not present

## 2022-08-05 DIAGNOSIS — K219 Gastro-esophageal reflux disease without esophagitis: Secondary | ICD-10-CM | POA: Diagnosis not present

## 2022-08-05 DIAGNOSIS — I25118 Atherosclerotic heart disease of native coronary artery with other forms of angina pectoris: Secondary | ICD-10-CM | POA: Diagnosis not present

## 2022-08-05 LAB — GLUCOSE, CAPILLARY
Glucose-Capillary: 94 mg/dL (ref 70–99)
Glucose-Capillary: 197 mg/dL — ABNORMAL HIGH (ref 70–99)

## 2022-08-05 LAB — BASIC METABOLIC PANEL
Anion gap: 9 (ref 5–15)
BUN: 16 mg/dL (ref 8–23)
CO2: 26 mmol/L (ref 22–32)
Calcium: 8 mg/dL — ABNORMAL LOW (ref 8.9–10.3)
Chloride: 103 mmol/L (ref 98–111)
Creatinine, Ser: 1.4 mg/dL — ABNORMAL HIGH (ref 0.61–1.24)
GFR, Estimated: 52 mL/min — ABNORMAL LOW (ref >60)
Glucose, Bld: 92 mg/dL (ref 70–99)
Potassium: 3.4 mmol/L — ABNORMAL LOW (ref 3.5–5.1)
Sodium: 138 mmol/L (ref 135–145)

## 2022-08-05 LAB — CULTURE, BLOOD (ROUTINE X 2): Culture: NO GROWTH

## 2022-08-05 LAB — CBC
HCT: 39.1 % (ref 39.0–52.0)
Hemoglobin: 12.7 g/dL — ABNORMAL LOW (ref 13.0–17.0)
MCH: 31.1 pg (ref 26.0–34.0)
MCHC: 32.5 g/dL (ref 30.0–36.0)
MCV: 95.8 fL (ref 80.0–100.0)
Platelets: 221 10*3/uL (ref 150–400)
RBC: 4.08 MIL/uL — ABNORMAL LOW (ref 4.22–5.81)
RDW: 13.5 % (ref 11.5–15.5)
WBC: 13.5 10*3/uL — ABNORMAL HIGH (ref 4.0–10.5)
nRBC: 0 % (ref 0.0–0.2)

## 2022-08-05 LAB — MAGNESIUM: Magnesium: 1.9 mg/dL (ref 1.7–2.4)

## 2022-08-05 MED ORDER — POTASSIUM CHLORIDE CRYS ER 20 MEQ PO TBCR
40.0000 meq | EXTENDED_RELEASE_TABLET | Freq: Once | ORAL | Status: AC
  Administered 2022-08-05: 40 meq via ORAL
  Filled 2022-08-05: qty 2

## 2022-08-05 MED ORDER — LEVOFLOXACIN 500 MG PO TABS: 500.0000 mg | ORAL_TABLET | Freq: Every day | ORAL | 0 refills | Status: AC

## 2022-08-05 NOTE — Care Management Important Message (Signed)
Important Message  Patient Details  Name: [name] MRN: [id number] Date of Birth: 1945/06/09   Medicare Important Message Given:  Yes     Falesha Schommer Stefan Church 08/05/2022, 2:57 PM

## 2022-08-05 NOTE — Discharge Instructions (Signed)
Follow up with your primary care provider in one week.  Follow-up with pulmonary outpatient in 1 to 2 weeks, office to schedule an appointment. Check CBC, BMP, magnesium in the next visit

## 2022-08-05 NOTE — Plan of Care (Signed)

## 2022-08-05 NOTE — Telephone Encounter (Signed)
Left a detailed message to please call me back so I can confirm he got the message about holding his plavix 5 days.

## 2022-08-05 NOTE — Discharge Summary (Signed)
Physician Discharge Summary  [name] UEA:[id number] DOB: 08-18-1945 DOA: 08/01/2022  PCP: Johny Blamer, MD  Admit date: 08/01/2022 Discharge date: 08/05/2022  Admitted From: Home  Discharge disposition: Home.   Recommendations for Outpatient Follow-Up:   Follow up with your primary care provider in one week.  Follow-up with pulmonary outpatient in 1 to 2 weeks, office to schedule an appointment. Check CBC, BMP, magnesium in the next visit   Discharge Diagnosis:   Principal Problem:   Multifocal pneumonia Active Problems:   Sarcoidosis   Coronary artery disease with exertional angina (HCC)   GERD   OSA (obstructive sleep apnea)   History of adrenal insufficiency  Discharge Condition: Improved.  Diet recommendation: Low sodium, heart healthy.  Carbohydrate-modified.    Wound care: None.  Code status: Full.   History of Present Illness:   [name] is a 77 y.o. male with past medical history of CAD status post PCI, type 2 diabetes, hypertension, peripheral vascular disease, pulmonary sarcoidosis, obstructive sleep apnea presented to hospital with chest pain and dyspnea and cough.  Patient was recently treated for pneumonia with Augmentin and doxycycline.  States that he has had multiple episodes of flareup with pneumonia since last year.  On this presentation, vitals were stable.  Labs showed leukocytosis, mild hyponatremia at 134, mild hypokalemia at 3.4.  CT angiogram of the chest showed multifocal pneumonia but no PE.   Patient stated that he used to follow pulmonary Dr. Delford Field in the past but has not followed up with pulmonary for the last 10 years or so. Patient was given broad-spectrum antibiotic and was admitted hospital for further evaluation and treatment.    Hospital Course:   Following conditions were addressed during hospitalization as listed below,  Multifocal pneumonia, POA History of frequent pneumonia, in the setting of pulmonary sarcoidosis on  chronic steroid Patient is immunocompromised due to chronic steroids.  Failed outpatient treatment.  BNP slightly elevated.  Procalcitonin less than 0.10.  MRSA screen not detected.  Patient was initially on IV vancomycin and cefepime.  Sputum culture with gram-negative rods and cocci in chains.  Blood cultures negative in 3 days.  Patient used to see pulmonary Dr. Delford Field in the past but had not had a follow-up in 10 years so pulmonary was consulted during hospitalization.  At this time plan is to continue Levaquin milligrams daily for 7 days on discharge.  On prednisone 10 mg at home will continue for now.  Continue incentive spirometry.  Patient will need to follow-up with pulmonary as outpatient.   Pulmonary sarcoidosis On prednisone at home.  Pulmonary has been consulted at this time plan is to transition to methotrexate at some point with outpatient pulmonary follow-up.  Patient will need to follow-up with pulmonary as outpatient to discern this.   Acute hypoxic respiratory failure secondary to pneumonia and pulmonary sarcoidosis. Resolved.  Currently on room air.  Continue incentive spirometry, antibiotic on discharge with outpatient pulmonary follow-up.  Will complete total 7-day course of Levaquin.   Elevated troponin, suspect demand ischemia in the setting of acute hypoxic respiratory failure Initial troponin 104. No evidence of acute ischemia on twelve-lead EKG. 2D echocardiogram shows LV ejection fraction of 55 to 60%.   Chronic HFpEF 50- 55% Last 2D echo done on 03/17/2022 revealed LVEF 50-55% with grade 1 diastolic dysfunction.  BNP elevated at 332.  Repeat 2D echocardiogram 08/02/2022 with LV ejection fraction of 55 to 60% with grade 1 diastolic dysfunction.  On oral Lasix at  home, will resume on discharge.   Hyperlipidemia Continue Zetia and Crestor   Essential hypertension On hydralazine, Lasix and metoprolol 12.5 twice daily home. Will continue on discharge..     Coronary artery  disease Continue aspirin, Plavix, Zetia, Crestor.   Hypothyroidism Continue Synthroid   Type 2 diabetes mellitus with hyperglycemia Latest hemoglobin A1c 9.4 on 03/17/2022 continue diabetic diet, sliding scale insulin.  On 70/30 insulin at 40 units 3 times daily at home.  Will resume on discharge.   Hypophosphatemia.  Phosphorus level of 2.9.  Has been replenished.   Mild hypokalemia.   Improved after replacement.  Will give additional potassium prior to discharge.  Magnesium 1.9.  Patient is on potassium at home which will be continued on discharge.   Disposition.  At this time, patient is stable for disposition home with outpatient PCP and pulmonary follow-up.  Medical Consultants:   Pulmonary  Procedures:    None Subjective:   Today, patient was seen and examined at bedside.  Feels much improved.  Feels that his breathing is better.  No chest discomfort.  Wants to go home.  Discharge Exam:   Vitals:   08/05/22 1129 08/05/22 1220  BP:  (!) 142/82  Pulse:  64  Resp:  18  Temp:  98.1 F (36.7 C)  SpO2: 95% 93%   Vitals:   08/05/22 0739 08/05/22 0815 08/05/22 1129 08/05/22 1220  BP: (!) 154/70   (!) 142/82  Pulse: 64   64  Resp:    18  Temp: 99.8 F (37.7 C)   98.1 F (36.7 C)  TempSrc:    Oral  SpO2: 93% 94% 95% 93%  Weight:       Body mass index is 30.56 kg/m.  General: Alert awake, not in obvious distress, obese built HENT: pupils equally reacting to light,  No scleral pallor or icterus noted. Oral mucosa is moist.  Chest:    Diminished breath sounds bilaterally.  Coarse breath sounds noted. CVS: S1 &S2 heard. No murmur.  Regular rate and rhythm. Abdomen: Soft, nontender, nondistended.  Bowel sounds are heard.   Extremities: No cyanosis, clubbing, trace edema peripheral pulses are palpable. Psych: Alert, awake and oriented, normal mood CNS:  No cranial nerve deficits.  Power equal in all extremities.   Skin: Warm and dry.  No rashes noted.  The results  of significant diagnostics from this hospitalization (including imaging, microbiology, ancillary and laboratory) are listed below for reference.     Diagnostic Studies:   ECHOCARDIOGRAM COMPLETE  Result Date: 08/02/2022    ECHOCARDIOGRAM REPORT   Patient Name:   [name] Date of Exam: 08/02/2022 Medical Rec #:  [id number]  Height:       70.0 in Accession #:    [id number] Weight:       213.0 lb Date of Birth:  [birth year]  BSA:          2.144 m Patient Age:    76 years   BP:           158/68 mmHg Patient Gender: M          HR:           79 bpm. Exam Location:  Inpatient Procedure: 2D Echo, Cardiac Doppler and Color Doppler Indications:    Elevated Troponin  History:        Patient has prior history of Echocardiogram examinations, most                 recent  03/17/2022. CAD and Angina, Arrythmias:PVC,                 Signs/Symptoms:Chest Pain and Dyspnea; Risk                 Factors:Hypertension, Sleep Apnea and Dyslipidemia. CKD, stage                 3.  Sonographer:    Lucendia Herrlich Referring Phys: [id number] CAROLE N HALL  Sonographer Comments: Technically difficult study due to poor echo windows. The patient did not cooperate for the full exam IMPRESSIONS  1. Left ventricular ejection fraction, by estimation, is 55 to 60%. The left ventricle has normal function. Left ventricular endocardial border not optimally defined to evaluate regional wall motion. Left ventricular diastolic parameters are consistent with Grade I diastolic dysfunction (impaired relaxation).  2. Right ventricular systolic function is normal. The right ventricular size is normal. Tricuspid regurgitation signal is inadequate for assessing PA pressure.  3. The mitral valve is grossly normal. Trivial mitral valve regurgitation. No evidence of mitral stenosis.  4. The aortic valve is tricuspid. Aortic valve regurgitation is not visualized. Aortic valve sclerosis is present, with no evidence of aortic valve stenosis. FINDINGS  Left Ventricle:  Left ventricular ejection fraction, by estimation, is 55 to 60%. The left ventricle has normal function. Left ventricular endocardial border not optimally defined to evaluate regional wall motion. The left ventricular internal cavity size was normal in size. There is no left ventricular hypertrophy. Left ventricular diastolic parameters are consistent with Grade I diastolic dysfunction (impaired relaxation). Right Ventricle: The right ventricular size is normal. No increase in right ventricular wall thickness. Right ventricular systolic function is normal. Tricuspid regurgitation signal is inadequate for assessing PA pressure. Left Atrium: Left atrial size was normal in size. Right Atrium: Right atrial size was not well visualized. Pericardium: Trivial pericardial effusion is present. Presence of epicardial fat layer. Mitral Valve: The mitral valve is grossly normal. Trivial mitral valve regurgitation. No evidence of mitral valve stenosis. Tricuspid Valve: The tricuspid valve is grossly normal. Tricuspid valve regurgitation is trivial. No evidence of tricuspid stenosis. Aortic Valve: The aortic valve is tricuspid. Aortic valve regurgitation is not visualized. Aortic valve sclerosis is present, with no evidence of aortic valve stenosis. Aortic valve peak gradient measures 12.0 mmHg. Pulmonic Valve: The pulmonic valve was grossly normal. Pulmonic valve regurgitation is not visualized. No evidence of pulmonic stenosis. Aorta: The aortic root and ascending aorta are structurally normal, with no evidence of dilitation. Venous: The inferior vena cava was not well visualized. IAS/Shunts: The interatrial septum was not well visualized.  LEFT VENTRICLE PLAX 2D LVIDd:         5.60 cm   Diastology LVIDs:         4.50 cm   LV e' medial:    5.35 cm/s LV PW:         1.10 cm   LV E/e' medial:  10.6 LV IVS:        1.00 cm   LV e' lateral:   6.39 cm/s LVOT diam:     2.00 cm   LV E/e' lateral: 8.9 LV SV:         72 LV SV Index:   34  LVOT Area:     3.14 cm  RIGHT VENTRICLE RV S prime:     12.50 cm/s TAPSE (M-mode): 1.9 cm LEFT ATRIUM             Index  RIGHT ATRIUM           Index LA diam:        3.50 cm 1.63 cm/m   RA Area:     14.70 cm LA Vol (A2C):   36.6 ml 17.07 ml/m  RA Volume:   37.40 ml  17.44 ml/m LA Vol (A4C):   50.8 ml 23.69 ml/m LA Biplane Vol: 41.5 ml 19.36 ml/m  AORTIC VALVE AV Area (Vmax): 1.97 cm AV Vmax:        173.00 cm/s AV Peak Grad:   12.0 mmHg LVOT Vmax:      108.67 cm/s LVOT Vmean:     72.100 cm/s LVOT VTI:       0.229 m  AORTA Ao Root diam: 3.50 cm Ao Asc diam:  3.20 cm MITRAL VALVE MV Area (PHT): 3.08 cm    SHUNTS MV Decel Time: 246 msec    Systemic VTI:  0.23 m MV E velocity: 56.90 cm/s  Systemic Diam: 2.00 cm MV A velocity: 86.30 cm/s MV E/A ratio:  0.66 Lennie Odor MD Electronically signed by Lennie Odor MD Signature Date/Time: 08/02/2022/2:29:42 PM    Final    CT Angio Chest Pulmonary Embolism (PE) W or WO Contrast  Result Date: 08/02/2022 CLINICAL DATA:  Worsening cough and chest tightness. EXAM: CT ANGIOGRAPHY CHEST WITH CONTRAST TECHNIQUE: Multidetector CT imaging of the chest was performed using the standard protocol during bolus administration of intravenous contrast. Multiplanar CT image reconstructions and MIPs were obtained to evaluate the vascular anatomy. RADIATION DOSE REDUCTION: This exam was performed according to the departmental dose-optimization program which includes automated exposure control, adjustment of the mA and/or kV according to patient size and/or use of iterative reconstruction technique. CONTRAST:  75mL OMNIPAQUE IOHEXOL 350 MG/ML SOLN COMPARISON:  March 15, 2022 FINDINGS: Cardiovascular: There is mild calcification of the thoracic aorta, without evidence of aortic aneurysm. Satisfactory opacification of the pulmonary arteries to the segmental level. No evidence of pulmonary embolism. Normal heart size with marked severity coronary artery calcification. Coronary  artery stent is in place. No pericardial effusion. Mediastinum/Nodes: Small left hilar and right infrahilar calcified lymph nodes are seen. Thyroid gland, trachea, and esophagus demonstrate no significant findings. Lungs/Pleura: A small area of patchy infiltrate is seen within the posterolateral aspect of the left upper lobe. Mild to moderate severity tree-in-bud appearing infiltrates are also seen within the right middle lobe and right lower lobe. Marked severity right middle lobe and right lower lobe peribronchial thickening is also noted. A stable 4 mm subpleural right upper lobe lung nodule is seen (axial CT image 63, CT series 6). A stable 7 mm pleural based lung nodule is seen within the posteromedial aspect of the right lower lobe (axial CT image 68, CT series 6). A 4 mm calcified lung nodule is seen within the posterior aspect of the right lung base. No pleural effusion or pneumothorax is identified Upper Abdomen: Multiple surgical clips are seen within the gallbladder fossa. Musculoskeletal: No chest wall abnormality. No acute or significant osseous findings. Review of the MIP images confirms the above findings. IMPRESSION: 1. No evidence of pulmonary embolism. 2. Mild to moderate severity right middle lobe and right lower lobe tree-in-bud appearing infiltrates which may represent sequelae associated with atypical infection. 3. Mild patchy left upper lobe infiltrate. Follow-up to resolution is recommended to exclude the presence of an underlying neoplastic process. 4. Marked severity right middle lobe and right lower lobe peribronchial thickening likely consistent with marked severity bronchitis. 5. Findings  likely consistent with sequelae associated with prior granulomatous disease. 6. Evidence of prior cholecystectomy. 7. Aortic atherosclerosis. Aortic Atherosclerosis (ICD10-I70.0). Electronically Signed   By: Aram Candela M.D.   On: 08/02/2022 03:17   DG Chest 2 View  Result Date:  08/01/2022 CLINICAL DATA:  cough, chest pain EXAM: CHEST - 2 VIEW COMPARISON:  Chest x-ray 07/24/2022 FINDINGS: The heart and mediastinal contours are unchanged. Aortic calcification. Persistent right lower lobe airspace opacity. Persistent left mid lung zone airspace opacity. No pulmonary edema. No pleural effusion. No pneumothorax. No acute osseous abnormality. IMPRESSION: 1. Persistent right lower lobe and mid left lung zone airspace opacity suggestive of infection. Followup PA and lateral chest X-ray is recommended in 3-4 weeks following therapy to ensure resolution and exclude underlying malignancy. 2.  Aortic Atherosclerosis (ICD10-I70.0). Electronically Signed   By: Tish Frederickson M.D.   On: 08/01/2022 21:59     Labs:   Basic Metabolic Panel: Recent Labs  Lab 08/01/22 2129 08/02/22 0654 08/03/22 0445 08/03/22 1713 08/04/22 0359 08/05/22 0608  NA 134* 134* 136  --  137 138  K 3.4* 3.7 3.3*  --  3.0* 3.4*  CL 96* 96* 97*  --  99 103  CO2 25 28 28   --  28 26  GLUCOSE 194* 213* 206* 557* 123* 92  BUN 14 15 20   --  17 16  CREATININE 1.01 1.05 1.21  --  1.23 1.40*  CALCIUM 8.3* 7.2* 8.1*  --  8.0* 8.0*  MG  --  1.7 1.7  --   --  1.9  PHOS  --  2.0* 2.9  --   --   --    GFR Estimated Creatinine Clearance: 52.3 mL/min (A) (by C-G formula based on SCr of 1.4 mg/dL (H)). Liver Function Tests: No results for input(s): "AST", "ALT", "ALKPHOS", "BILITOT", "PROT", "ALBUMIN" in the last 168 hours. No results for input(s): "LIPASE", "AMYLASE" in the last 168 hours. No results for input(s): "AMMONIA" in the last 168 hours. Coagulation profile No results for input(s): "INR", "PROTIME" in the last 168 hours.  CBC: Recent Labs  Lab 08/01/22 2129 08/02/22 0654 08/03/22 0445 08/05/22 0608  WBC 13.2* 10.7* 10.8* 13.5*  NEUTROABS 9.6*  --   --   --   HGB 15.1 12.9* 13.8 12.7*  HCT 47.3 40.0 43.0 39.1  MCV 97.9 96.2 95.8 95.8  PLT 246 197 207 221   Cardiac Enzymes: No results for  input(s): "CKTOTAL", "CKMB", "CKMBINDEX", "TROPONINI" in the last 168 hours. BNP: Invalid input(s): "POCBNP" CBG: Recent Labs  Lab 08/04/22 1214 08/04/22 1643 08/04/22 2237 08/05/22 0734 08/05/22 1233  GLUCAP 306* 131* 122* 94 197*   D-Dimer No results for input(s): "DDIMER" in the last 72 hours. Hgb A1c No results for input(s): "HGBA1C" in the last 72 hours. Lipid Profile No results for input(s): "CHOL", "HDL", "LDLCALC", "TRIG", "CHOLHDL", "LDLDIRECT" in the last 72 hours. Thyroid function studies No results for input(s): "TSH", "T4TOTAL", "T3FREE", "THYROIDAB" in the last 72 hours.  Invalid input(s): "FREET3" Anemia work up No results for input(s): "VITAMINB12", "FOLATE", "FERRITIN", "TIBC", "IRON", "RETICCTPCT" in the last 72 hours. Microbiology Recent Results (from the past 240 hour(s))  Culture, blood (Routine X 2) w Reflex to ID Panel     Status: None (Preliminary result)   Collection Time: 08/02/22 12:01 AM   Specimen: BLOOD  Result Value Ref Range Status   Specimen Description BLOOD SITE NOT SPECIFIED  Final   Special Requests   Final  BOTTLES DRAWN AEROBIC AND ANAEROBIC Blood Culture adequate volume   Culture   Final    NO GROWTH 3 DAYS Performed at [facility], [address] [place], [place] [address]    Report Status PENDING  Incomplete  SARS Coronavirus 2 by RT PCR (hospital order, performed in [facility] hospital lab) *cepheid single result test* Anterior Nasal Swab     Status: None   Collection Time: 08/02/22 12:15 AM   Specimen: Anterior Nasal Swab  Result Value Ref Range Status   SARS Coronavirus 2 by RT PCR NEGATIVE NEGATIVE Final    Comment: Performed at [facility], [address], [place], [place] [address]  Culture, blood (Routine X 2) w Reflex to ID Panel     Status: None (Preliminary result)   Collection Time: 08/02/22  1:05 AM   Specimen: BLOOD RIGHT HAND  Result Value Ref Range Status   Specimen Description BLOOD RIGHT HAND   Final   Special Requests   Final    BOTTLES DRAWN AEROBIC AND ANAEROBIC Blood Culture adequate volume   Culture   Final    NO GROWTH 3 DAYS Performed at [facility], [address], [place], [place] [address]    Report Status PENDING  Incomplete  MRSA Next Gen by PCR, Nasal     Status: None   Collection Time: 08/02/22  4:37 AM   Specimen: Nasal Mucosa; Nasal Swab  Result Value Ref Range Status   MRSA by PCR Next Gen NOT DETECTED NOT DETECTED Final    Comment: (NOTE) The GeneXpert MRSA Assay (FDA approved for NASAL specimens only), is one component of a comprehensive MRSA colonization surveillance program. It is not intended to diagnose MRSA infection nor to guide or monitor treatment for MRSA infections. Test performance is not FDA approved in patients less than 36 years old. Performed at [facility], [address] [place], [place] [address]   Expectorated Sputum Assessment w Gram Stain, Rflx to Resp Cult     Status: None   Collection Time: 08/02/22  5:33 AM   Specimen: Expectorated Sputum  Result Value Ref Range Status   Specimen Description EXPECTORATED SPUTUM  Final   Special Requests Normal  Final   Sputum evaluation   Final    THIS SPECIMEN IS ACCEPTABLE FOR SPUTUM CULTURE Performed at [facility], [address], [place], [place] [address]    Report Status 08/02/2022 FINAL  Final  Culture, Respiratory w Gram Stain     Status: None   Collection Time: 08/02/22  5:33 AM  Result Value Ref Range Status   Specimen Description EXPECTORATED SPUTUM  Final   Special Requests Normal Reflexed from [id number]  Final   Gram Stain   Final    FEW WBC PRESENT,BOTH PMN AND MONONUCLEAR FEW GRAM NEGATIVE RODS RARE GRAM POSITIVE COCCI IN CHAINS    Culture   Final    MODERATE Normal respiratory flora-no Staph aureus or Pseudomonas seen Performed at [facility], [address], [place], [place] [address]    Report Status 08/04/2022 FINAL  Final      Discharge Instructions:   Discharge Instructions     Diet - low sodium heart healthy   Complete by: As directed    Discharge instructions   Complete by: As directed    Follow-up with your primary care provider in 1 week.  Complete the course of antibiotic.  No overexertion.  Follow-up with pulmonary as  outpatient (office to schedule an appointment with you).  Seek medical attention for worsening symptoms.   Increase activity slowly   Complete by: As directed       Allergies as of 08/05/2022       Reactions   Statins Other (See Comments)   Aggression  Pt ok to take crestor 20mg  once a week   Hydrocortisone Nausea Only   Ranexa [ranolazine] Other (See Comments)   Severe weakness/near syncope after 1 dose Hallucinations    Doxycycline Diarrhea   Hydrocodone Nausea And Vomiting   Miralax [polyethylene Glycol] Nausea And Vomiting   Z-pak [azithromycin] Other (See Comments)   Raises blood sugar   Zaroxolyn [metolazone] Other (See Comments)   Drained, no energy        Medication List     STOP taking these medications    amoxicillin-clavulanate 875-125 MG tablet Commonly known as: AUGMENTIN   nitroGLYCERIN 0.4 MG SL tablet Commonly known as: NITROSTAT       TAKE these medications    acetaminophen 500 MG tablet Commonly known as: TYLENOL Take 1,000 mg by mouth daily as needed for mild pain (pain).   albuterol (2.5 MG/3ML) 0.083% nebulizer solution Commonly known as: PROVENTIL Take 3 mLs (2.5 mg total) by nebulization every 6 (six) hours as needed for wheezing or shortness of breath. Dx Code D86.0   aspirin EC 81 MG tablet Take 81 mg by mouth every evening.   clopidogrel 75 MG tablet Commonly known as: PLAVIX Take 1 tablet (75 mg total) by mouth daily. Restart on 09/28/21   ezetimibe 10 MG tablet Commonly known as: ZETIA Take 10 mg by mouth in the morning.   furosemide 80 MG tablet Commonly known as: LASIX Take 1 tablet (80 mg total) by mouth daily  as needed for edema (take Lasix 80 mg as needed if gains more than 3 pounds in 1 day or 5 pounds in 1 week).   hydrALAZINE 10 MG tablet Commonly known as: APRESOLINE Take 10 mg by mouth in the morning and at bedtime.   insulin aspart protamine- aspart (70-30) 100 UNIT/ML injection Commonly known as: NOVOLOG MIX 70/30 Inject 0.4 mLs (40 Units total) into the skin with breakfast, with lunch, and with evening meal.   Iron (Ferrous Sulfate) 325 (65 Fe) MG Tabs Take 325 mg by mouth daily.   levofloxacin 500 MG tablet Commonly known as: LEVAQUIN Take 1 tablet (500 mg total) by mouth daily for 6 days. Start taking on: August 06, 2022   levothyroxine 75 MCG tablet Commonly known as: SYNTHROID Take 75 mcg by mouth in the morning.   metoprolol tartrate 25 MG tablet Commonly known as: LOPRESSOR Take 0.5 tablets (12.5 mg total) by mouth 2 (two) times daily.   montelukast 10 MG tablet Commonly known as: SINGULAIR Take 10 mg by mouth daily as needed (allergies).   potassium chloride 10 MEQ tablet Commonly known as: KLOR-CON M Take 10 mEq by mouth in the morning.   predniSONE 10 MG tablet Commonly known as: DELTASONE Take 10 mg by mouth in the morning. Continuous course.   RABEprazole 20 MG tablet Commonly known as: ACIPHEX Take 20 mg by mouth daily as needed (GERD).   rosuvastatin 20 MG tablet Commonly known as: CRESTOR Take 1 tablet (20 mg total) by mouth at bedtime. What changed: when to take this   silver sulfADIAZINE 1 % cream Commonly known as: SILVADENE Apply 1 Application topically daily. Apply to leg  Follow-up Information     Johny Blamer, MD. Schedule an appointment as soon as possible for a visit.   Specialty: Family Medicine Why: Please schedule an appointment in the next 7-10 days with your primary care provider at the office. Contact information: [address] [place] [address] [phone]         Tomma Lightning, MD. Go  to.   Specialty: Pulmonary Disease Why: when scheduled by the clinic, if you dont hear in few days call office for appointment Contact information: [address] [place] [phone]                  Time coordinating discharge: 39 minutes  Signed:  Klaudia Beirne  [facility] 08/05/2022, 1:18 PM

## 2022-08-06 LAB — ASPERGILLUS ANTIBODY BY IMMUNODIFF
Aspergillus flavus: NEGATIVE
Aspergillus fumigatus, IgG: NEGATIVE
Aspergillus niger: NEGATIVE

## 2022-08-07 LAB — CULTURE, BLOOD (ROUTINE X 2): Special Requests: ADEQUATE

## 2022-08-08 LAB — FUNGITELL BETA-D-GLUCAN
Fungitell Value:: <31.25 pg/mL
Result Name:: NEGATIVE

## 2022-08-10 DIAGNOSIS — Z9189 Other specified personal risk factors, not elsewhere classified: Secondary | ICD-10-CM | POA: Diagnosis not present

## 2022-08-10 DIAGNOSIS — Z8631 Personal history of diabetic foot ulcer: Secondary | ICD-10-CM | POA: Diagnosis not present

## 2022-08-10 DIAGNOSIS — E1143 Type 2 diabetes mellitus with diabetic autonomic (poly)neuropathy: Secondary | ICD-10-CM | POA: Diagnosis not present

## 2022-08-10 DIAGNOSIS — L84 Corns and callosities: Secondary | ICD-10-CM | POA: Diagnosis not present

## 2022-08-10 DIAGNOSIS — E11610 Type 2 diabetes mellitus with diabetic neuropathic arthropathy: Secondary | ICD-10-CM | POA: Diagnosis not present

## 2022-08-11 DIAGNOSIS — D849 Immunodeficiency, unspecified: Secondary | ICD-10-CM | POA: Diagnosis not present

## 2022-08-11 DIAGNOSIS — E1165 Type 2 diabetes mellitus with hyperglycemia: Secondary | ICD-10-CM | POA: Diagnosis not present

## 2022-08-11 DIAGNOSIS — Z09 Encounter for follow-up examination after completed treatment for conditions other than malignant neoplasm: Secondary | ICD-10-CM | POA: Diagnosis not present

## 2022-08-11 DIAGNOSIS — D860 Sarcoidosis of lung: Secondary | ICD-10-CM | POA: Diagnosis not present

## 2022-08-11 DIAGNOSIS — J189 Pneumonia, unspecified organism: Secondary | ICD-10-CM | POA: Diagnosis not present

## 2022-08-11 NOTE — Telephone Encounter (Signed)
Patient calling to confirm message received this morning.

## 2022-08-11 NOTE — Telephone Encounter (Signed)
Left him another detailed message to please confirm he has gotten my message about holding his plavix x 5 days prior to his procedure.

## 2022-08-23 DIAGNOSIS — R944 Abnormal results of kidney function studies: Secondary | ICD-10-CM | POA: Diagnosis not present

## 2022-08-23 NOTE — Progress Notes (Incomplete)
COVID Vaccine Completed: yes  Date of COVID positive in last 90 days:  PCP - Johny Blamer, MD Cardiologist - Donato Schultz, MD LOV 06/17/22  Cardiac clearance by Carlos Levering 07/13/22 in Epic  Chest x-ray - 08/01/22 Epic EKG - 08/01/22 Epic Stress Test - 03/05/14 Epic ECHO - 08/02/22 Epic Cardiac Cath - 08/10/21 CEW Pacemaker/ICD device last checked: Spinal Cord Stimulator:  Bowel Prep -   Sleep Study -  CPAP -   Fasting Blood Sugar -  Checks Blood Sugar _____ times a day  Novolog- take as prescribed before meals day before, no bedtime dose. Do not take morning of surgery unless blood sugar is greater than 220, then take 50% of dose  Last dose of GLP1 agonist-  N/A GLP1 instructions:  N/A   Last dose of SGLT-2 inhibitors-  N/A SGLT-2 instructions: N/A   Blood Thinner Instructions:  Plavix, hold 5 days Aspirin Instructions: ASA 81 Last Dose: 08/26/22   Activity level:  Can go up a flight of stairs and perform activities of daily living without stopping and without symptoms of chest pain or shortness of breath.  Able to exercise without symptoms  Unable to go up a flight of stairs without symptoms of     Anesthesia review: CAD, CHF, PAD, Htn, PVCs, OSA, pulmonary sarcoidosis, DM2,CKD 3, bifasicular block on EKG, stents, recent pneumonia   Patient denies shortness of breath, fever, cough and chest pain at PAT appointment  Patient verbalized understanding of instructions that were given to them at the PAT appointment. Patient was also instructed that they will need to review over the PAT instructions again at home before surgery.

## 2022-08-25 ENCOUNTER — Telehealth: Payer: Self-pay | Admitting: Internal Medicine

## 2022-08-25 NOTE — Telephone Encounter (Signed)
Noted Cancel procedure I will place back on list

## 2022-08-25 NOTE — Telephone Encounter (Signed)
PT has a procedure 6/27 at [facility] and needs to have it cancelled. He has pneumonia.

## 2022-08-25 NOTE — Telephone Encounter (Signed)
Sir wanted you to see this message before I call and cancel the procedure. I will put him back on our hospital list as well.

## 2022-09-01 ENCOUNTER — Encounter (HOSPITAL_COMMUNITY): Admission: RE | Payer: Self-pay | Source: Home / Self Care

## 2022-09-01 ENCOUNTER — Ambulatory Visit (HOSPITAL_COMMUNITY): Admission: RE | Admit: 2022-09-01 | Payer: Medicare HMO | Source: Home / Self Care | Admitting: Internal Medicine

## 2022-09-01 SURGERY — COLONOSCOPY WITH PROPOFOL
Anesthesia: Monitor Anesthesia Care

## 2022-09-28 DIAGNOSIS — Z9189 Other specified personal risk factors, not elsewhere classified: Secondary | ICD-10-CM | POA: Diagnosis not present

## 2022-09-28 DIAGNOSIS — S90822A Blister (nonthermal), left foot, initial encounter: Secondary | ICD-10-CM | POA: Diagnosis not present

## 2022-09-28 DIAGNOSIS — E11610 Type 2 diabetes mellitus with diabetic neuropathic arthropathy: Secondary | ICD-10-CM | POA: Diagnosis not present

## 2022-09-29 ENCOUNTER — Telehealth: Payer: Self-pay | Admitting: Internal Medicine

## 2022-09-29 NOTE — Telephone Encounter (Signed)
Attempted to fax records, however line busy. Will try again at a later time.

## 2022-09-29 NOTE — Telephone Encounter (Signed)
Inbound call from [place] with [facility] requesting o/v notes be faxed over to [phone]. Please advise.   Thank you

## 2022-09-29 NOTE — Telephone Encounter (Signed)
Attempted to fax records again & still reaching busy signal. Will reach out to office in the morning to see if there is a better fax number to send to.

## 2022-09-30 NOTE — Telephone Encounter (Signed)
Fax sent to [facility] PCP.

## 2022-09-30 NOTE — Telephone Encounter (Signed)
Attempted to fax records & still receiving busy line. Contacted office who stated they only have on fax machine. Will try again at a later time.

## 2022-10-10 DIAGNOSIS — S90822A Blister (nonthermal), left foot, initial encounter: Secondary | ICD-10-CM | POA: Diagnosis not present

## 2022-10-18 DIAGNOSIS — S90822D Blister (nonthermal), left foot, subsequent encounter: Secondary | ICD-10-CM | POA: Diagnosis not present

## 2022-10-26 DIAGNOSIS — S90822A Blister (nonthermal), left foot, initial encounter: Secondary | ICD-10-CM | POA: Diagnosis not present

## 2022-10-26 DIAGNOSIS — Z9189 Other specified personal risk factors, not elsewhere classified: Secondary | ICD-10-CM | POA: Diagnosis not present

## 2022-10-26 DIAGNOSIS — E11610 Type 2 diabetes mellitus with diabetic neuropathic arthropathy: Secondary | ICD-10-CM | POA: Diagnosis not present

## 2022-10-26 DIAGNOSIS — Z8631 Personal history of diabetic foot ulcer: Secondary | ICD-10-CM | POA: Diagnosis not present

## 2022-10-26 DIAGNOSIS — E1143 Type 2 diabetes mellitus with diabetic autonomic (poly)neuropathy: Secondary | ICD-10-CM | POA: Diagnosis not present

## 2022-10-31 DIAGNOSIS — E11610 Type 2 diabetes mellitus with diabetic neuropathic arthropathy: Secondary | ICD-10-CM | POA: Diagnosis not present

## 2022-10-31 DIAGNOSIS — E1143 Type 2 diabetes mellitus with diabetic autonomic (poly)neuropathy: Secondary | ICD-10-CM | POA: Diagnosis not present

## 2022-10-31 DIAGNOSIS — Z8631 Personal history of diabetic foot ulcer: Secondary | ICD-10-CM | POA: Diagnosis not present

## 2022-11-03 DIAGNOSIS — N1831 Chronic kidney disease, stage 3a: Secondary | ICD-10-CM | POA: Diagnosis not present

## 2022-11-03 DIAGNOSIS — E039 Hypothyroidism, unspecified: Secondary | ICD-10-CM | POA: Diagnosis not present

## 2022-11-03 DIAGNOSIS — I5032 Chronic diastolic (congestive) heart failure: Secondary | ICD-10-CM | POA: Diagnosis not present

## 2022-11-03 DIAGNOSIS — L98491 Non-pressure chronic ulcer of skin of other sites limited to breakdown of skin: Secondary | ICD-10-CM | POA: Diagnosis not present

## 2022-11-03 DIAGNOSIS — I10 Essential (primary) hypertension: Secondary | ICD-10-CM | POA: Diagnosis not present

## 2022-11-03 DIAGNOSIS — D860 Sarcoidosis of lung: Secondary | ICD-10-CM | POA: Diagnosis not present

## 2022-11-03 DIAGNOSIS — E1142 Type 2 diabetes mellitus with diabetic polyneuropathy: Secondary | ICD-10-CM | POA: Diagnosis not present

## 2022-11-03 DIAGNOSIS — E782 Mixed hyperlipidemia: Secondary | ICD-10-CM | POA: Diagnosis not present

## 2022-11-03 DIAGNOSIS — I2510 Atherosclerotic heart disease of native coronary artery without angina pectoris: Secondary | ICD-10-CM | POA: Diagnosis not present

## 2022-11-22 DIAGNOSIS — H6122 Impacted cerumen, left ear: Secondary | ICD-10-CM | POA: Diagnosis not present

## 2022-11-22 DIAGNOSIS — H6002 Abscess of left external ear: Secondary | ICD-10-CM | POA: Diagnosis not present

## 2022-11-23 DIAGNOSIS — E1143 Type 2 diabetes mellitus with diabetic autonomic (poly)neuropathy: Secondary | ICD-10-CM | POA: Diagnosis not present

## 2022-11-23 DIAGNOSIS — Z8631 Personal history of diabetic foot ulcer: Secondary | ICD-10-CM | POA: Diagnosis not present

## 2022-11-23 DIAGNOSIS — E11610 Type 2 diabetes mellitus with diabetic neuropathic arthropathy: Secondary | ICD-10-CM | POA: Diagnosis not present

## 2022-11-28 ENCOUNTER — Telehealth: Payer: Self-pay | Admitting: Internal Medicine

## 2022-11-28 NOTE — Telephone Encounter (Signed)
Left message for pt to call back  °

## 2022-11-28 NOTE — Telephone Encounter (Signed)
Please explain to patient that if he wishes to pursue hospital EGD/colonoscopy will need to schedule office visit to see me.  He had to cancel more than once due to illnesses and it is not clear to me that risk/benefit ratio favors doing these procedures.

## 2022-11-29 NOTE — Telephone Encounter (Signed)
Left message for pt to call back  °

## 2022-11-30 DIAGNOSIS — H903 Sensorineural hearing loss, bilateral: Secondary | ICD-10-CM | POA: Diagnosis not present

## 2022-11-30 DIAGNOSIS — H6123 Impacted cerumen, bilateral: Secondary | ICD-10-CM | POA: Diagnosis not present

## 2022-11-30 NOTE — Telephone Encounter (Signed)
Left message for pt to call back  °

## 2022-12-01 ENCOUNTER — Other Ambulatory Visit: Payer: Self-pay | Admitting: Physician Assistant

## 2022-12-01 NOTE — Telephone Encounter (Signed)
4th attempt to reach patient & left message for patient to call back. Letter mailed home.

## 2023-01-09 ENCOUNTER — Telehealth: Payer: Self-pay | Admitting: Pharmacy Technician

## 2023-01-09 NOTE — Telephone Encounter (Signed)
Auth Submission: APPROVED Site of care: Site of care: [facility] Payer: [employer] MEDICARE Medication & CPT/J Code(s) submitted: Monoferric (Ferrci derisomaltose) [phone] Route of submission (phone, fax, portal):  Phone # Fax # Auth type: Buy/Bill PB Units/visits requested: X1 (AS NEEDED) Reference number:[id number] Approval from: 03/08/23 to 03/06/24

## 2023-01-27 DIAGNOSIS — E1122 Type 2 diabetes mellitus with diabetic chronic kidney disease: Secondary | ICD-10-CM | POA: Diagnosis not present

## 2023-01-27 DIAGNOSIS — E1142 Type 2 diabetes mellitus with diabetic polyneuropathy: Secondary | ICD-10-CM | POA: Diagnosis not present

## 2023-01-27 DIAGNOSIS — M545 Low back pain, unspecified: Secondary | ICD-10-CM | POA: Diagnosis not present

## 2023-01-27 DIAGNOSIS — D860 Sarcoidosis of lung: Secondary | ICD-10-CM | POA: Diagnosis not present

## 2023-01-27 DIAGNOSIS — Z794 Long term (current) use of insulin: Secondary | ICD-10-CM | POA: Diagnosis not present

## 2023-01-27 DIAGNOSIS — J40 Bronchitis, not specified as acute or chronic: Secondary | ICD-10-CM | POA: Diagnosis not present

## 2023-01-27 DIAGNOSIS — H60332 Swimmer's ear, left ear: Secondary | ICD-10-CM | POA: Diagnosis not present

## 2023-02-09 ENCOUNTER — Other Ambulatory Visit: Payer: Self-pay | Admitting: Physician Assistant

## 2023-02-22 DIAGNOSIS — Z8631 Personal history of diabetic foot ulcer: Secondary | ICD-10-CM | POA: Diagnosis not present

## 2023-02-22 DIAGNOSIS — E1143 Type 2 diabetes mellitus with diabetic autonomic (poly)neuropathy: Secondary | ICD-10-CM | POA: Diagnosis not present

## 2023-02-22 DIAGNOSIS — Z9189 Other specified personal risk factors, not elsewhere classified: Secondary | ICD-10-CM | POA: Diagnosis not present

## 2023-02-22 DIAGNOSIS — E11610 Type 2 diabetes mellitus with diabetic neuropathic arthropathy: Secondary | ICD-10-CM | POA: Diagnosis not present

## 2023-02-25 NOTE — Progress Notes (Signed)
 [facility] Subjective:  Patient ID: Craig Jama Hoard Sr. is a 77 y.o. (DOB Jul 18, 1945) male      Patient presents with  . Follow-up    Left plantar foot check ulcer remains healed he is in his diabetic shoes and inserts. He is doing well overall         History of Present Illness The patient is a 77 year old male who presents for follow-up with a history of diabetic complications, including ulcerations.  He reports that his foot condition has improved significantly, attributing this to the use of a newly designed insert.   Reviewed and updated this visit by provider: Tobacco  Allergies  Meds  Problems  Med Hx  Surg Hx  Fam Hx       Objective:   Vitals:   02/22/23 1032  PainSc: 0-No pain  PainLoc: Foot   Physical Exam   Physical Exam Pt is alert, oriented, in no apparent distress. Hgb A1C on chart. Body Habitus: truncal obesity. Heart:  RRR. Lungs:  Regular, non-labored.   Lower extremity exam: Skin:   No open wounds or signs of infection.   Wound: Nails thickened, elongated and dystrophic bilateral. Pre-ulcerative calluses: Vascular:  Pedal pulses present but diminished. Neurologic:   Decreased vibratory, sharp/dull, and monofilament discrimination. Musculoskeletal:  Deformity amputation stable. Shoegear:  Diabetic shoes, custom orthoses appropriate.        Results     Assessment / Plan:  Assessment 1. Charcot foot due to diabetes mellitus (*) (Primary) -     Ambulatory Referral for DME 2. History of diabetic ulcer of foot -     Ambulatory Referral for DME 3. At risk for diabetic foot ulcer -     Ambulatory Referral for DME 4. Diabetic autonomic neuropathy associated with type 2 diabetes mellitus (*) -     Ambulatory Referral for DME   Assessment & Plan 1. Diabetic foot ulcer. The patient's diabetic foot ulcer is showing signs of improvement with the current treatment regimen. A referral will be initiated for further evaluation  in January 2025 to ensure the inserts are made correctly.   No follow-ups on file. Risks, benefits, and alternatives of the medications and treatment plan prescribed today were discussed, and patient expressed understanding. Plan follow-up as discussed or as needed if any worsening symptoms or change in condition.    I have reviewed the information contained in this note and personally verified its accuracy.  I obtained or reviewed the history of present illness and personally performed the physical exam, and all in office imagining including xray and or ultrasound with documentation of findings in the above note.  [name], DPM  02/25/2023 9:37 AM   Computer technology was used to create visit note. Consent from the patient/caregiver was obtained prior to its use.

## 2023-03-09 DIAGNOSIS — E11319 Type 2 diabetes mellitus with unspecified diabetic retinopathy without macular edema: Secondary | ICD-10-CM | POA: Diagnosis not present

## 2023-03-09 DIAGNOSIS — E11621 Type 2 diabetes mellitus with foot ulcer: Secondary | ICD-10-CM | POA: Diagnosis not present

## 2023-03-09 DIAGNOSIS — E782 Mixed hyperlipidemia: Secondary | ICD-10-CM | POA: Diagnosis not present

## 2023-03-09 DIAGNOSIS — N1831 Chronic kidney disease, stage 3a: Secondary | ICD-10-CM | POA: Diagnosis not present

## 2023-03-09 DIAGNOSIS — E1142 Type 2 diabetes mellitus with diabetic polyneuropathy: Secondary | ICD-10-CM | POA: Diagnosis not present

## 2023-03-09 DIAGNOSIS — I10 Essential (primary) hypertension: Secondary | ICD-10-CM | POA: Diagnosis not present

## 2023-03-09 DIAGNOSIS — H6062 Unspecified chronic otitis externa, left ear: Secondary | ICD-10-CM | POA: Diagnosis not present

## 2023-03-09 DIAGNOSIS — K219 Gastro-esophageal reflux disease without esophagitis: Secondary | ICD-10-CM | POA: Diagnosis not present

## 2023-03-09 DIAGNOSIS — E039 Hypothyroidism, unspecified: Secondary | ICD-10-CM | POA: Diagnosis not present

## 2023-04-27 ENCOUNTER — Telehealth (INDEPENDENT_AMBULATORY_CARE_PROVIDER_SITE_OTHER): Payer: Self-pay | Admitting: Otolaryngology

## 2023-04-27 NOTE — Telephone Encounter (Signed)
Reminder call: Date: 04/28/2023 Status: Sch  Time: 9:30 AM [address] [place], [place] [address]  Left voicemail w/time and location

## 2023-04-28 ENCOUNTER — Institutional Professional Consult (permissible substitution) (INDEPENDENT_AMBULATORY_CARE_PROVIDER_SITE_OTHER): Payer: Medicare HMO

## 2023-05-02 DIAGNOSIS — R197 Diarrhea, unspecified: Secondary | ICD-10-CM | POA: Diagnosis not present

## 2023-05-02 DIAGNOSIS — Z03818 Encounter for observation for suspected exposure to other biological agents ruled out: Secondary | ICD-10-CM | POA: Diagnosis not present

## 2023-05-02 DIAGNOSIS — R5383 Other fatigue: Secondary | ICD-10-CM | POA: Diagnosis not present

## 2023-05-02 DIAGNOSIS — R6883 Chills (without fever): Secondary | ICD-10-CM | POA: Diagnosis not present

## 2023-05-03 DIAGNOSIS — R197 Diarrhea, unspecified: Secondary | ICD-10-CM | POA: Diagnosis not present

## 2023-05-24 DIAGNOSIS — S90426A Blister (nonthermal), unspecified lesser toe(s), initial encounter: Secondary | ICD-10-CM | POA: Diagnosis not present

## 2023-05-24 DIAGNOSIS — E1143 Type 2 diabetes mellitus with diabetic autonomic (poly)neuropathy: Secondary | ICD-10-CM | POA: Diagnosis not present

## 2023-05-24 DIAGNOSIS — Z1330 Encounter for screening examination for mental health and behavioral disorders, unspecified: Secondary | ICD-10-CM | POA: Diagnosis not present

## 2023-05-24 DIAGNOSIS — E11610 Type 2 diabetes mellitus with diabetic neuropathic arthropathy: Secondary | ICD-10-CM | POA: Diagnosis not present

## 2023-05-24 DIAGNOSIS — Z8631 Personal history of diabetic foot ulcer: Secondary | ICD-10-CM | POA: Diagnosis not present

## 2023-06-13 DIAGNOSIS — N1831 Chronic kidney disease, stage 3a: Secondary | ICD-10-CM | POA: Diagnosis not present

## 2023-06-13 DIAGNOSIS — K219 Gastro-esophageal reflux disease without esophagitis: Secondary | ICD-10-CM | POA: Diagnosis not present

## 2023-06-13 DIAGNOSIS — E039 Hypothyroidism, unspecified: Secondary | ICD-10-CM | POA: Diagnosis not present

## 2023-06-13 DIAGNOSIS — D508 Other iron deficiency anemias: Secondary | ICD-10-CM | POA: Diagnosis not present

## 2023-06-13 DIAGNOSIS — I10 Essential (primary) hypertension: Secondary | ICD-10-CM | POA: Diagnosis not present

## 2023-06-13 DIAGNOSIS — E11621 Type 2 diabetes mellitus with foot ulcer: Secondary | ICD-10-CM | POA: Diagnosis not present

## 2023-06-13 DIAGNOSIS — I5032 Chronic diastolic (congestive) heart failure: Secondary | ICD-10-CM | POA: Diagnosis not present

## 2023-06-13 DIAGNOSIS — E11319 Type 2 diabetes mellitus with unspecified diabetic retinopathy without macular edema: Secondary | ICD-10-CM | POA: Diagnosis not present

## 2023-06-13 DIAGNOSIS — E782 Mixed hyperlipidemia: Secondary | ICD-10-CM | POA: Diagnosis not present

## 2023-07-07 ENCOUNTER — Encounter (INDEPENDENT_AMBULATORY_CARE_PROVIDER_SITE_OTHER): Payer: Self-pay

## 2023-07-07 ENCOUNTER — Ambulatory Visit (INDEPENDENT_AMBULATORY_CARE_PROVIDER_SITE_OTHER): Admitting: Otolaryngology

## 2023-07-07 VITALS — BP 151/71 | HR 72 | Ht 70.0 in | Wt 216.0 lb

## 2023-07-07 DIAGNOSIS — H6123 Impacted cerumen, bilateral: Secondary | ICD-10-CM

## 2023-07-07 DIAGNOSIS — H903 Sensorineural hearing loss, bilateral: Secondary | ICD-10-CM

## 2023-07-07 DIAGNOSIS — H608X2 Other otitis externa, left ear: Secondary | ICD-10-CM | POA: Diagnosis not present

## 2023-07-07 MED ORDER — MOMETASONE FUROATE 0.1 % EX CREA
TOPICAL_CREAM | CUTANEOUS | 3 refills | Status: DC
Start: 2023-07-07 — End: 2023-10-10

## 2023-07-09 DIAGNOSIS — H608X2 Other otitis externa, left ear: Secondary | ICD-10-CM | POA: Insufficient documentation

## 2023-07-09 DIAGNOSIS — H6123 Impacted cerumen, bilateral: Secondary | ICD-10-CM | POA: Insufficient documentation

## 2023-07-09 DIAGNOSIS — H903 Sensorineural hearing loss, bilateral: Secondary | ICD-10-CM | POA: Insufficient documentation

## 2023-07-09 NOTE — Progress Notes (Signed)
 Patient ID: [name], male   DOB: [birth year], 78 y.o.   MRN: [id number]  Follow-up: Hearing loss New complaint: Left ear pain and left ear swelling  HPI: The patient is a 78 year old male who presents today complaining of left ear pain and left ear swelling for the past few months.  He has a history of bilateral sensorineural hearing loss.  He wears bilateral hearing aids.  He was recently fitted with a new set of hearing aids.  He was seen by his primary care physician, and was treated with oral and topical antibiotics.  He has noted improvement in his hearing with the new hearing aids.  Currently he denies any otorrhea, vertigo, or recent change in his hearing.  Exam: General: Communicates without difficulty, well nourished, no acute distress. Head: Normocephalic, no evidence injury, no tenderness, facial buttresses intact without stepoff. Face/sinus: No tenderness to palpation and percussion. Facial movement is normal and symmetric. Eyes: PERRL, EOMI. No scleral icterus, conjunctivae clear. Neuro: CN II exam reveals vision grossly intact.  No nystagmus at any point of gaze. Ears: Auricles well formed without lesions.  Bilateral cerumen impaction.  Nose: External evaluation reveals normal support and skin without lesions.  Dorsum is intact.  Anterior rhinoscopy reveals congested mucosa over anterior aspect of inferior turbinates and intact septum.  No purulence noted. Oral:  Oral cavity and oropharynx are intact, symmetric, without erythema or edema.  Mucosa is moist without lesions. Neck: Full range of motion without pain.  There is no significant lymphadenopathy.  No masses palpable.  Thyroid  bed within normal limits to palpation.  Parotid glands and submandibular glands equal bilaterally without mass.  Trachea is midline. Neuro:  CN 2-12 grossly intact.   Procedure: Bilateral cerumen disimpaction Anesthesia: None Description: Under the operating microscope, the cerumen is carefully removed with a  combination of cerumen currette, alligator forceps, and suction catheters.  After the cerumen is removed, the left ear canal is noted to be edematous and eczematous.  Both tympanic membranes are intact and mobile.  No mass, erythema, or lesions. The patient tolerated the procedure well.    Assessment: 1.  Bilateral cerumen impaction. 2.  Chronic eczematous left otitis externa. 3.  Subjectively stable bilateral sensorineural hearing loss.  Plan: 1.  Otomicroscopy with bilateral cerumen disimpaction. 2.  The physical exam findings are reviewed with the patient. 3.  Elocon  cream to treat the chronic eczematous otitis externa. 4.  The patient will return for reevaluation in 1 month.

## 2023-07-14 DIAGNOSIS — Z8631 Personal history of diabetic foot ulcer: Secondary | ICD-10-CM | POA: Diagnosis not present

## 2023-07-14 DIAGNOSIS — L84 Corns and callosities: Secondary | ICD-10-CM | POA: Diagnosis not present

## 2023-07-14 DIAGNOSIS — I739 Peripheral vascular disease, unspecified: Secondary | ICD-10-CM | POA: Diagnosis not present

## 2023-07-14 DIAGNOSIS — E11621 Type 2 diabetes mellitus with foot ulcer: Secondary | ICD-10-CM | POA: Diagnosis not present

## 2023-08-11 ENCOUNTER — Ambulatory Visit (INDEPENDENT_AMBULATORY_CARE_PROVIDER_SITE_OTHER): Admitting: Otolaryngology

## 2023-08-24 ENCOUNTER — Ambulatory Visit: Admitting: Cardiology

## 2023-09-24 ENCOUNTER — Other Ambulatory Visit: Payer: Self-pay | Admitting: Cardiology

## 2023-10-03 DIAGNOSIS — L97529 Non-pressure chronic ulcer of other part of left foot with unspecified severity: Secondary | ICD-10-CM | POA: Diagnosis not present

## 2023-10-06 NOTE — Progress Notes (Signed)
 [facility] Subjective:  Patient ID: Craig Jama Hoard Sr. is a 78 y.o. (DOB February 24, 1946) male      Patient presents with  . Wound Check    Patient reports that about 2 weeks ago he was walking across some gravel and he noticed some blood the next day.  He has been using neosporin and covering with a bandaid.  He is wearing his flat surgical shoe.        History of Present Illness The patient presents for evaluation of a foot problem.  The issue began two Saturdays ago after using new shoes and inserts, identical to those used last year. He suspects the problem was triggered while walking across a gravel parking lot. Upon returning home, he noticed foot discomfort and believes the shoe pad may have worn out. He has been managing with a Band-Aid, but the problem persists. No fever or chills reported.   Reviewed and updated this visit by provider: Tobacco  Allergies  Meds  Problems  Med Hx  Surg Hx  Fam Hx       Objective:  There were no vitals filed for this visit. Physical Exam   Physical Exam Patient is alert, oriented (x3), in no apparent distress. Pain:  Mild   Moderate   Severe  Heart:  RRR. Lungs:  Regular, non-labored.  Lower extremity neurologic and vascular exams unchanged.  Wound: Surrounding tissue callused, otherwise normal without clinical signs of infection (erythema  cellulitis  purulence). Base of the wound:  Granular (red)  Biomembrane (pink)   Marginal epithelialization: yes no Exudate: See measurements. Refer to photos:      Results     Assessment / Plan:  Assessment 1. Ulcer of left foot, unspecified ulcer stage (*) (Primary) Other orders -     cadexomer iodine (IODOSORB) 0.9% gel; Apply topically daily as needed for Wound Care., Starting Tue 10/03/2023, Until Wed 10/02/2024 at 2359, Normal   Assessment & Plan 1. Foot problem: Acute. - Provide tall CAM boot to alleviate pressure and promote healing - Apply Iodosorb to the  affected area - Provide Iodosorb for home use - Band-Aid management insufficient, indicating need for additional care   Follow up 3-4 weeks vogler.. Risks, benefits, and alternatives of the medications and treatment plan prescribed today were discussed, and patient expressed understanding. Plan follow-up as discussed or as needed if any worsening symptoms or change in condition.    I have reviewed the information contained in this note and personally verified its accuracy.  I obtained or reviewed the history of present illness and personally performed the physical exam, and all in office imagining including xray and or ultrasound with documentation of findings in the above note.  [name], DPM  10/06/2023 9:25 PM   Computer technology was used to create visit note. Consent from the patient/caregiver was obtained prior to its use.

## 2023-10-09 ENCOUNTER — Emergency Department (HOSPITAL_COMMUNITY)

## 2023-10-09 ENCOUNTER — Inpatient Hospital Stay (HOSPITAL_COMMUNITY)
Admission: EM | Admit: 2023-10-09 | Discharge: 2023-10-13 | DRG: 638 | Disposition: A | Discharge status: Home / Self Care | Attending: Internal Medicine | Admitting: Internal Medicine

## 2023-10-09 ENCOUNTER — Encounter (HOSPITAL_COMMUNITY): Payer: Self-pay | Admitting: Emergency Medicine

## 2023-10-09 ENCOUNTER — Other Ambulatory Visit: Payer: Self-pay

## 2023-10-09 DIAGNOSIS — Z888 Allergy status to other drugs, medicaments and biological substances status: Secondary | ICD-10-CM

## 2023-10-09 DIAGNOSIS — M19072 Primary osteoarthritis, left ankle and foot: Secondary | ICD-10-CM | POA: Diagnosis present

## 2023-10-09 DIAGNOSIS — W228XXA Striking against or struck by other objects, initial encounter: Secondary | ICD-10-CM | POA: Diagnosis present

## 2023-10-09 DIAGNOSIS — I130 Hypertensive heart and chronic kidney disease with heart failure and stage 1 through stage 4 chronic kidney disease, or unspecified chronic kidney disease: Secondary | ICD-10-CM | POA: Diagnosis not present

## 2023-10-09 DIAGNOSIS — E785 Hyperlipidemia, unspecified: Secondary | ICD-10-CM | POA: Diagnosis not present

## 2023-10-09 DIAGNOSIS — Z7952 Long term (current) use of systemic steroids: Secondary | ICD-10-CM

## 2023-10-09 DIAGNOSIS — Z7982 Long term (current) use of aspirin: Secondary | ICD-10-CM

## 2023-10-09 DIAGNOSIS — G4733 Obstructive sleep apnea (adult) (pediatric): Secondary | ICD-10-CM | POA: Diagnosis not present

## 2023-10-09 DIAGNOSIS — N179 Acute kidney failure, unspecified: Secondary | ICD-10-CM | POA: Diagnosis present

## 2023-10-09 DIAGNOSIS — Z8701 Personal history of pneumonia (recurrent): Secondary | ICD-10-CM

## 2023-10-09 DIAGNOSIS — Z8051 Family history of malignant neoplasm of kidney: Secondary | ICD-10-CM

## 2023-10-09 DIAGNOSIS — M129 Arthropathy, unspecified: Secondary | ICD-10-CM | POA: Diagnosis not present

## 2023-10-09 DIAGNOSIS — M19071 Primary osteoarthritis, right ankle and foot: Secondary | ICD-10-CM | POA: Diagnosis present

## 2023-10-09 DIAGNOSIS — Z79890 Hormone replacement therapy (postmenopausal): Secondary | ICD-10-CM

## 2023-10-09 DIAGNOSIS — M79605 Pain in left leg: Secondary | ICD-10-CM | POA: Diagnosis not present

## 2023-10-09 DIAGNOSIS — T148XXA Other injury of unspecified body region, initial encounter: Secondary | ICD-10-CM | POA: Diagnosis not present

## 2023-10-09 DIAGNOSIS — E7800 Pure hypercholesterolemia, unspecified: Secondary | ICD-10-CM

## 2023-10-09 DIAGNOSIS — E2740 Unspecified adrenocortical insufficiency: Secondary | ICD-10-CM | POA: Diagnosis present

## 2023-10-09 DIAGNOSIS — L03116 Cellulitis of left lower limb: Secondary | ICD-10-CM | POA: Diagnosis not present

## 2023-10-09 DIAGNOSIS — E039 Hypothyroidism, unspecified: Secondary | ICD-10-CM | POA: Diagnosis present

## 2023-10-09 DIAGNOSIS — L0390 Cellulitis, unspecified: Secondary | ICD-10-CM | POA: Diagnosis present

## 2023-10-09 DIAGNOSIS — Z794 Long term (current) use of insulin: Secondary | ICD-10-CM | POA: Diagnosis not present

## 2023-10-09 DIAGNOSIS — D869 Sarcoidosis, unspecified: Secondary | ICD-10-CM | POA: Diagnosis not present

## 2023-10-09 DIAGNOSIS — E11610 Type 2 diabetes mellitus with diabetic neuropathic arthropathy: Secondary | ICD-10-CM | POA: Diagnosis present

## 2023-10-09 DIAGNOSIS — I517 Cardiomegaly: Secondary | ICD-10-CM | POA: Diagnosis not present

## 2023-10-09 DIAGNOSIS — Z8639 Personal history of other endocrine, nutritional and metabolic disease: Secondary | ICD-10-CM

## 2023-10-09 DIAGNOSIS — Z881 Allergy status to other antibiotic agents status: Secondary | ICD-10-CM

## 2023-10-09 DIAGNOSIS — S91302A Unspecified open wound, left foot, initial encounter: Secondary | ICD-10-CM | POA: Diagnosis not present

## 2023-10-09 DIAGNOSIS — E11628 Type 2 diabetes mellitus with other skin complications: Principal | ICD-10-CM | POA: Diagnosis present

## 2023-10-09 DIAGNOSIS — L97509 Non-pressure chronic ulcer of other part of unspecified foot with unspecified severity: Secondary | ICD-10-CM | POA: Diagnosis not present

## 2023-10-09 DIAGNOSIS — E871 Hypo-osmolality and hyponatremia: Secondary | ICD-10-CM | POA: Diagnosis present

## 2023-10-09 DIAGNOSIS — K219 Gastro-esophageal reflux disease without esophagitis: Secondary | ICD-10-CM | POA: Diagnosis not present

## 2023-10-09 DIAGNOSIS — E11621 Type 2 diabetes mellitus with foot ulcer: Secondary | ICD-10-CM | POA: Diagnosis present

## 2023-10-09 DIAGNOSIS — Z79899 Other long term (current) drug therapy: Secondary | ICD-10-CM

## 2023-10-09 DIAGNOSIS — I2510 Atherosclerotic heart disease of native coronary artery without angina pectoris: Secondary | ICD-10-CM | POA: Diagnosis not present

## 2023-10-09 DIAGNOSIS — I5032 Chronic diastolic (congestive) heart failure: Secondary | ICD-10-CM | POA: Diagnosis present

## 2023-10-09 DIAGNOSIS — E876 Hypokalemia: Secondary | ICD-10-CM | POA: Diagnosis present

## 2023-10-09 DIAGNOSIS — L089 Local infection of the skin and subcutaneous tissue, unspecified: Secondary | ICD-10-CM | POA: Diagnosis not present

## 2023-10-09 DIAGNOSIS — M21962 Unspecified acquired deformity of left lower leg: Secondary | ICD-10-CM | POA: Diagnosis present

## 2023-10-09 DIAGNOSIS — L97424 Non-pressure chronic ulcer of left heel and midfoot with necrosis of bone: Secondary | ICD-10-CM

## 2023-10-09 DIAGNOSIS — Z7902 Long term (current) use of antithrombotics/antiplatelets: Secondary | ICD-10-CM

## 2023-10-09 DIAGNOSIS — L0889 Other specified local infections of the skin and subcutaneous tissue: Secondary | ICD-10-CM | POA: Diagnosis not present

## 2023-10-09 DIAGNOSIS — Z841 Family history of disorders of kidney and ureter: Secondary | ICD-10-CM

## 2023-10-09 DIAGNOSIS — E1165 Type 2 diabetes mellitus with hyperglycemia: Secondary | ICD-10-CM | POA: Diagnosis present

## 2023-10-09 DIAGNOSIS — Z01818 Encounter for other preprocedural examination: Secondary | ICD-10-CM | POA: Diagnosis not present

## 2023-10-09 DIAGNOSIS — E66811 Obesity, class 1: Secondary | ICD-10-CM | POA: Diagnosis present

## 2023-10-09 DIAGNOSIS — M14672 Charcot's joint, left ankle and foot: Secondary | ICD-10-CM | POA: Diagnosis not present

## 2023-10-09 DIAGNOSIS — M86172 Other acute osteomyelitis, left ankle and foot: Secondary | ICD-10-CM | POA: Diagnosis not present

## 2023-10-09 DIAGNOSIS — D849 Immunodeficiency, unspecified: Secondary | ICD-10-CM | POA: Diagnosis present

## 2023-10-09 DIAGNOSIS — Z833 Family history of diabetes mellitus: Secondary | ICD-10-CM

## 2023-10-09 DIAGNOSIS — D860 Sarcoidosis of lung: Secondary | ICD-10-CM | POA: Diagnosis present

## 2023-10-09 DIAGNOSIS — E1122 Type 2 diabetes mellitus with diabetic chronic kidney disease: Secondary | ICD-10-CM | POA: Diagnosis present

## 2023-10-09 DIAGNOSIS — M7989 Other specified soft tissue disorders: Secondary | ICD-10-CM | POA: Diagnosis not present

## 2023-10-09 DIAGNOSIS — M79672 Pain in left foot: Secondary | ICD-10-CM | POA: Diagnosis not present

## 2023-10-09 DIAGNOSIS — Z6828 Body mass index (BMI) 28.0-28.9, adult: Secondary | ICD-10-CM

## 2023-10-09 DIAGNOSIS — E860 Dehydration: Secondary | ICD-10-CM | POA: Diagnosis present

## 2023-10-09 DIAGNOSIS — N1831 Chronic kidney disease, stage 3a: Secondary | ICD-10-CM | POA: Diagnosis not present

## 2023-10-09 DIAGNOSIS — Z8249 Family history of ischemic heart disease and other diseases of the circulatory system: Secondary | ICD-10-CM

## 2023-10-09 DIAGNOSIS — L97429 Non-pressure chronic ulcer of left heel and midfoot with unspecified severity: Secondary | ICD-10-CM | POA: Diagnosis present

## 2023-10-09 DIAGNOSIS — E1151 Type 2 diabetes mellitus with diabetic peripheral angiopathy without gangrene: Secondary | ICD-10-CM | POA: Diagnosis present

## 2023-10-09 DIAGNOSIS — M7732 Calcaneal spur, left foot: Secondary | ICD-10-CM | POA: Diagnosis not present

## 2023-10-09 DIAGNOSIS — Z743 Need for continuous supervision: Secondary | ICD-10-CM | POA: Diagnosis not present

## 2023-10-09 DIAGNOSIS — I10 Essential (primary) hypertension: Secondary | ICD-10-CM | POA: Diagnosis not present

## 2023-10-09 DIAGNOSIS — H9193 Unspecified hearing loss, bilateral: Secondary | ICD-10-CM | POA: Diagnosis present

## 2023-10-09 DIAGNOSIS — Z974 Presence of external hearing-aid: Secondary | ICD-10-CM

## 2023-10-09 DIAGNOSIS — Z885 Allergy status to narcotic agent status: Secondary | ICD-10-CM

## 2023-10-09 DIAGNOSIS — Z955 Presence of coronary angioplasty implant and graft: Secondary | ICD-10-CM

## 2023-10-09 DIAGNOSIS — Z9049 Acquired absence of other specified parts of digestive tract: Secondary | ICD-10-CM

## 2023-10-09 DIAGNOSIS — R9431 Abnormal electrocardiogram [ECG] [EKG]: Secondary | ICD-10-CM | POA: Diagnosis not present

## 2023-10-09 LAB — CK: Total CK: 29 U/L — ABNORMAL LOW (ref 49–397)

## 2023-10-09 LAB — CBC WITH DIFFERENTIAL/PLATELET
Abs Immature Granulocytes: 0.17 K/uL — ABNORMAL HIGH (ref 0.00–0.07)
Basophils Absolute: 0.1 K/uL (ref 0.0–0.1)
Basophils Relative: 0 %
Eosinophils Absolute: 0.3 K/uL (ref 0.0–0.5)
Eosinophils Relative: 2 %
HCT: 39.7 % (ref 39.0–52.0)
Hemoglobin: 12.9 g/dL — ABNORMAL LOW (ref 13.0–17.0)
Immature Granulocytes: 1 %
Lymphocytes Relative: 8 %
Lymphs Abs: 1 K/uL (ref 0.7–4.0)
MCH: 31.9 pg (ref 26.0–34.0)
MCHC: 32.5 g/dL (ref 30.0–36.0)
MCV: 98.3 fL (ref 80.0–100.0)
Monocytes Absolute: 1.2 K/uL — ABNORMAL HIGH (ref 0.1–1.0)
Monocytes Relative: 9 %
Neutro Abs: 10.5 K/uL — ABNORMAL HIGH (ref 1.7–7.7)
Neutrophils Relative %: 80 %
Platelets: 192 K/uL (ref 150–400)
RBC: 4.04 MIL/uL — ABNORMAL LOW (ref 4.22–5.81)
RDW: 13.1 % (ref 11.5–15.5)
WBC: 13.3 K/uL — ABNORMAL HIGH (ref 4.0–10.5)
nRBC: 0 % (ref 0.0–0.2)

## 2023-10-09 LAB — URINALYSIS, W/ REFLEX TO CULTURE (INFECTION SUSPECTED)
Bacteria, UA: NONE SEEN
Bilirubin Urine: NEGATIVE
Glucose, UA: >=500 mg/dL — AB
Ketones, ur: NEGATIVE mg/dL
Leukocytes,Ua: NEGATIVE
Nitrite: NEGATIVE
Protein, ur: NEGATIVE mg/dL
Specific Gravity, Urine: 1.012 (ref 1.005–1.030)
pH: 5 (ref 5.0–8.0)

## 2023-10-09 LAB — COMPREHENSIVE METABOLIC PANEL WITH GFR
ALT: 15 U/L (ref 0–44)
AST: 23 U/L (ref 15–41)
Albumin: 2.9 g/dL — ABNORMAL LOW (ref 3.5–5.0)
Alkaline Phosphatase: 54 U/L (ref 38–126)
Anion gap: 15 (ref 5–15)
BUN: 24 mg/dL — ABNORMAL HIGH (ref 8–23)
CO2: 22 mmol/L (ref 22–32)
Calcium: 8.1 mg/dL — ABNORMAL LOW (ref 8.9–10.3)
Chloride: 90 mmol/L — ABNORMAL LOW (ref 98–111)
Creatinine, Ser: 1.58 mg/dL — ABNORMAL HIGH (ref 0.61–1.24)
GFR, Estimated: 45 mL/min — ABNORMAL LOW (ref >60)
Glucose, Bld: 474 mg/dL — ABNORMAL HIGH (ref 70–99)
Potassium: 3.4 mmol/L — ABNORMAL LOW (ref 3.5–5.1)
Sodium: 127 mmol/L — ABNORMAL LOW (ref 135–145)
Total Bilirubin: 1.2 mg/dL (ref 0.0–1.2)
Total Protein: 6.1 g/dL — ABNORMAL LOW (ref 6.5–8.1)

## 2023-10-09 LAB — CBG MONITORING, ED: Glucose-Capillary: 387 mg/dL — ABNORMAL HIGH (ref 70–99)

## 2023-10-09 LAB — BASIC METABOLIC PANEL WITH GFR
Anion gap: 11 (ref 5–15)
BUN: 24 mg/dL — ABNORMAL HIGH (ref 8–23)
CO2: 26 mmol/L (ref 22–32)
Calcium: 8.2 mg/dL — ABNORMAL LOW (ref 8.9–10.3)
Chloride: 95 mmol/L — ABNORMAL LOW (ref 98–111)
Creatinine, Ser: 1.36 mg/dL — ABNORMAL HIGH (ref 0.61–1.24)
GFR, Estimated: 54 mL/min — ABNORMAL LOW (ref >60)
Glucose, Bld: 395 mg/dL — ABNORMAL HIGH (ref 70–99)
Potassium: 2.8 mmol/L — ABNORMAL LOW (ref 3.5–5.1)
Sodium: 132 mmol/L — ABNORMAL LOW (ref 135–145)

## 2023-10-09 LAB — TSH: TSH: 3.437 u[IU]/mL (ref 0.350–4.500)

## 2023-10-09 LAB — MRSA NEXT GEN BY PCR, NASAL: MRSA by PCR Next Gen: NOT DETECTED

## 2023-10-09 LAB — FERRITIN: Ferritin: 339 ng/mL — ABNORMAL HIGH (ref 24–336)

## 2023-10-09 LAB — SODIUM, URINE, RANDOM: Sodium, Ur: 25 mmol/L

## 2023-10-09 LAB — PHOSPHORUS: Phosphorus: 1.5 mg/dL — ABNORMAL LOW (ref 2.5–4.6)

## 2023-10-09 LAB — CREATININE, URINE, RANDOM: Creatinine, Urine: 80 mg/dL

## 2023-10-09 LAB — SEDIMENTATION RATE: Sed Rate: 63 mm/h — ABNORMAL HIGH (ref 0–16)

## 2023-10-09 LAB — MAGNESIUM: Magnesium: 1.8 mg/dL (ref 1.7–2.4)

## 2023-10-09 MED ORDER — LEVOTHYROXINE SODIUM 75 MCG PO TABS
75.0000 ug | ORAL_TABLET | Freq: Every day | ORAL | Status: DC
  Administered 2023-10-10 – 2023-10-13 (×4): 75 ug via ORAL
  Filled 2023-10-09 (×4): qty 1

## 2023-10-09 MED ORDER — FENTANYL CITRATE PF 50 MCG/ML IJ SOSY: 12.5000 ug | PREFILLED_SYRINGE | INTRAMUSCULAR | Status: DC | PRN

## 2023-10-09 MED ORDER — EZETIMIBE 10 MG PO TABS
10.0000 mg | ORAL_TABLET | Freq: Every day | ORAL | Status: DC
  Administered 2023-10-10 – 2023-10-13 (×4): 10 mg via ORAL
  Filled 2023-10-09 (×4): qty 1

## 2023-10-09 MED ORDER — INSULIN ASPART 100 UNIT/ML IJ SOLN
0.0000 [IU] | INTRAMUSCULAR | Status: DC
  Administered 2023-10-09: 9 [IU] via SUBCUTANEOUS
  Administered 2023-10-10: 2 [IU] via SUBCUTANEOUS
  Administered 2023-10-10 (×2): 5 [IU] via SUBCUTANEOUS
  Administered 2023-10-10: 2 [IU] via SUBCUTANEOUS
  Administered 2023-10-10 – 2023-10-11 (×3): 9 [IU] via SUBCUTANEOUS
  Administered 2023-10-11 (×2): 2 [IU] via SUBCUTANEOUS
  Filled 2023-10-09: qty 0.09

## 2023-10-09 MED ORDER — VANCOMYCIN HCL 1500 MG/300ML IV SOLN
1500.0000 mg | Freq: Once | INTRAVENOUS | Status: AC
  Administered 2023-10-09: 1500 mg via INTRAVENOUS
  Filled 2023-10-09: qty 300

## 2023-10-09 MED ORDER — PREDNISONE 10 MG PO TABS
10.0000 mg | ORAL_TABLET | Freq: Every day | ORAL | Status: DC
  Administered 2023-10-10 – 2023-10-13 (×4): 10 mg via ORAL
  Filled 2023-10-09 (×4): qty 1

## 2023-10-09 MED ORDER — VANCOMYCIN HCL 1250 MG/250ML IV SOLN
1250.0000 mg | INTRAVENOUS | Status: DC
  Filled 2023-10-09: qty 250

## 2023-10-09 MED ORDER — METOPROLOL TARTRATE 12.5 MG HALF TABLET
12.5000 mg | ORAL_TABLET | Freq: Two times a day (BID) | ORAL | Status: DC
  Administered 2023-10-10 – 2023-10-13 (×7): 12.5 mg via ORAL
  Filled 2023-10-09 (×7): qty 1

## 2023-10-09 MED ORDER — ACETAMINOPHEN 650 MG RE SUPP: 650.0000 mg | Freq: Four times a day (QID) | RECTAL | Status: DC | PRN

## 2023-10-09 MED ORDER — ONDANSETRON HCL 4 MG PO TABS: 4.0000 mg | ORAL_TABLET | Freq: Four times a day (QID) | ORAL | Status: DC | PRN

## 2023-10-09 MED ORDER — SODIUM CHLORIDE 0.9 % IV SOLN: INTRAVENOUS | Status: AC

## 2023-10-09 MED ORDER — CLOPIDOGREL BISULFATE 75 MG PO TABS
75.0000 mg | ORAL_TABLET | Freq: Every day | ORAL | Status: DC
Start: 2023-10-10 — End: 2023-10-13
  Administered 2023-10-10 – 2023-10-11 (×2): 75 mg via ORAL
  Filled 2023-10-09 (×3): qty 1

## 2023-10-09 MED ORDER — SODIUM CHLORIDE 0.9 % IV SOLN
3.0000 g | Freq: Four times a day (QID) | INTRAVENOUS | Status: DC
  Administered 2023-10-09 – 2023-10-13 (×15): 3 g via INTRAVENOUS
  Filled 2023-10-09 (×17): qty 8

## 2023-10-09 MED ORDER — ALBUTEROL SULFATE (2.5 MG/3ML) 0.083% IN NEBU: 2.5000 mg | INHALATION_SOLUTION | Freq: Four times a day (QID) | RESPIRATORY_TRACT | Status: DC | PRN

## 2023-10-09 MED ORDER — ONDANSETRON HCL 4 MG/2ML IJ SOLN: 4.0000 mg | Freq: Four times a day (QID) | INTRAMUSCULAR | Status: DC | PRN

## 2023-10-09 MED ORDER — VANCOMYCIN HCL IN DEXTROSE 1-5 GM/200ML-% IV SOLN: 1000.0000 mg | Freq: Once | INTRAVENOUS | Status: DC

## 2023-10-09 MED ORDER — MONTELUKAST SODIUM 10 MG PO TABS: 10.0000 mg | ORAL_TABLET | Freq: Every day | ORAL | Status: DC | PRN

## 2023-10-09 MED ORDER — INSULIN NPH (HUMAN) (ISOPHANE) 100 UNIT/ML ~~LOC~~ SUSP
20.0000 [IU] | Freq: Two times a day (BID) | SUBCUTANEOUS | Status: DC
  Administered 2023-10-10 – 2023-10-12 (×5): 20 [IU] via SUBCUTANEOUS
  Filled 2023-10-09: qty 10

## 2023-10-09 MED ORDER — ACETAMINOPHEN 325 MG PO TABS: 650.0000 mg | ORAL_TABLET | Freq: Four times a day (QID) | ORAL | Status: DC | PRN

## 2023-10-09 NOTE — ED Provider Notes (Signed)
 Patient signed out to myself by previous PA Hildegard Loge, please see his note for further detail. Briefly, patient pending workup for cellulitis versus osteomyelitis of left lower extremity.  Past medical history significant for diabetes. Patient was seen outpatient by podiatry with [facility] and prescribed iodosorb gel and given CAM boot.  Patient and wife then state that entire leg became red and painful about 2 days ago.   Vital signs stable, patient afebrile in the emergency department  Physical exam consistent with cellulitis of left lower extremity with ulcer on plantar surface of foot, purulent drainage present, wound will actively drain if pressure is applied  Plan to admit for IV antibiotics once lab work is back, of note x-ray could not rule out osteomyelitis  CBC so showed elevated white blood cell count of 13.3  Spoke with hospitalist (Dr. Silvester), plan to admit to [facility], MRI of left foot ordered without contrast due to renal function, patient started on vancomycin  and Unasyn , talk to Dr. Pierce with podiatry who states he will see patient tomorrow at [facility].   Per podiatry do not hold plavix , admit to [facility], vanc and unasyn , MRI left foot   Most likely diagnosis at this time is diabetic foot ulcer of left foot versus osteomyelitis versus cellulitis, will know more after further imaging.   Craig Terrall FALCON, PA-C 10/10/23 0134    Craig Sid SAILOR, MD 10/12/23 1012

## 2023-10-09 NOTE — ED Provider Notes (Signed)
 [name] EMERGENCY DEPARTMENT AT [facility] Provider Note   CSN: [id number] Arrival date & time: 10/09/23  1228     Patient presents with: Wound Infection   [name] is a 78 y.o. male.   79 year old male presents today for concern of wound to his left foot.  This started 2 weeks ago as a blister after he stepped on something.  He went and saw his podiatrist and was put in a walking boot.  He was also given a cream to apply as needed but he states this has not helped.  2 days ago this became purulent.  He has had worsening erythema up the leg.  Subjective fevers over the past couple days as well.  The history is provided by the patient. No language interpreter was used.       Prior to Admission medications   Medication Sig Start Date End Date Taking? Authorizing Provider  acetaminophen  (TYLENOL ) 500 MG tablet Take 1,000 mg by mouth daily as needed for mild pain (pain).    [provider]  albuterol  (PROVENTIL ) (2.5 MG/3ML) 0.083% nebulizer solution Take 3 mLs (2.5 mg total) by nebulization every 6 (six) hours as needed for wheezing or shortness of breath. Dx Code D86.0 07/24/14   Brien Belvie BRAVO, MD  aspirin  EC 81 MG tablet Take 81 mg by mouth every evening.     [provider]  clopidogrel  (PLAVIX ) 75 MG tablet Take 1 tablet (75 mg total) by mouth daily. Restart on 09/28/21 09/25/21   Maree Bracken D, DO  ezetimibe  (ZETIA ) 10 MG tablet Take 10 mg by mouth in the morning. 06/25/21   [provider]  furosemide  (LASIX ) 80 MG tablet Take 1 tablet (80 mg total) by mouth daily as needed for edema (take Lasix  80 mg as needed if gains more than 3 pounds in 1 day or 5 pounds in 1 week). 10/21/21   Christobal Guadalajara, MD  hydrALAZINE  (APRESOLINE ) 10 MG tablet Take 10 mg by mouth in the morning and at bedtime.    [provider]  insulin  aspart protamine- aspart (NOVOLOG  MIX 70/30) (70-30) 100 UNIT/ML injection Inject 0.4 mLs (40 Units total) into the skin  with breakfast, with lunch, and with evening meal. 09/16/21   Vann, Jessica U, DO  Iron , Ferrous Sulfate , 325 (65 Fe) MG TABS Take 325 mg by mouth daily. 01/18/22   Avram Lupita BRAVO, MD  levothyroxine  (SYNTHROID ) 75 MCG tablet Take 75 mcg by mouth in the morning. 02/24/21   [provider]  metoprolol  tartrate (LOPRESSOR ) 25 MG tablet TAKE 0.5 TABLETS BY MOUTH 2 TIMES DAILY. 02/13/23   Jeffrie Oneil BROCKS, MD  mometasone  (ELOCON ) 0.1 % cream Apply topical daily as needed for pain/itch 07/07/23   Karis Clunes, MD  montelukast  (SINGULAIR ) 10 MG tablet Take 10 mg by mouth daily as needed (allergies). 07/18/18   [provider]  potassium chloride  (KLOR-CON  M) 10 MEQ tablet Take 10 mEq by mouth in the morning.    [provider]  predniSONE  (DELTASONE ) 10 MG tablet Take 10 mg by mouth in the morning. Continuous course.    [provider]  RABEprazole  (ACIPHEX ) 20 MG tablet Take 20 mg by mouth daily as needed (GERD). 09/28/21   [provider]  rosuvastatin  (CRESTOR ) 20 MG tablet TAKE 1 TABLET AT BEDTIME 09/25/23   Jeffrie Oneil BROCKS, MD  silver sulfADIAZINE (SILVADENE) 1 % cream Apply 1 Application topically daily. Apply to leg 07/21/22   [provider]    Allergies: Statins, Hydrocortisone , Ranexa [ranolazine], Doxycycline , Hydrocodone , Miralax  [polyethylene glycol], Z-pak [azithromycin ], and Zaroxolyn  [metolazone ]    Review of Systems  Constitutional:  Positive for fever.  Skin:  Positive for wound.  All other systems reviewed and are negative.   Updated Vital Signs BP 99/75 (BP Location: Right Arm)   Pulse 98   Temp 98.1 F (36.7 C) (Oral)   Resp 16   Ht 5' 10 (1.778 m)   Wt 93 kg   SpO2 96%   BMI 29.41 kg/m   Physical Exam Vitals and nursing note reviewed.  Constitutional:      General: He is not in acute distress.    Appearance: Normal appearance. He is not ill-appearing.  HENT:     Head: Normocephalic and atraumatic.     Nose: Nose normal.   Eyes:     Conjunctiva/sclera: Conjunctivae normal.  Cardiovascular:     Rate and Rhythm: Normal rate and regular rhythm.  Pulmonary:     Effort: Pulmonary effort is normal. No respiratory distress.     Breath sounds: No wheezing.  Musculoskeletal:        General: No deformity. Normal range of motion.     Cervical back: Normal range of motion.     Right lower leg: No edema.     Left lower leg: No edema.  Skin:    Findings: Erythema (Left lower extremity) present. No rash.     Comments: Neurovascularly intact in the left lower extremity.  Wound over the plantar aspect.  See attached image for description.  There is erythema extending up to about the knee.  Neurological:     Mental Status: He is alert.     (all labs ordered are listed, but only abnormal results are displayed) Labs Reviewed  CULTURE, BLOOD (ROUTINE X 2)  CULTURE, BLOOD (ROUTINE X 2)  CBC WITH DIFFERENTIAL/PLATELET  URINALYSIS, W/ REFLEX TO CULTURE (INFECTION SUSPECTED)  COMPREHENSIVE METABOLIC PANEL WITH GFR  I-STAT CG4 LACTIC ACID, ED    EKG: None  Radiology: No results found.   Procedures   Medications Ordered in the ED  vancomycin  (VANCOREADY) IVPB 1500 mg/300 mL (has no administration in time range)    Clinical Course as of 10/09/23 1514  Mon Oct 09, 2023  1438 Metabolic panel resulted.  Shows creatinine of 1.58, glucose level of 474, and hyponatremia with sodium of 127.  Corrected sodium of 133. CBC still pending.  Spoke to lab and they state that the previous sample clotted off and they are needing a new sample.  This has not been sent off yet.  I notified the nurse of this. [AA]  1507 Plan to admit for purulent cellulitis vs. Osteomyelitis, wound on foot x 2 weeks, saw podiatry given cream and put in CAM boot, passed 2 days wound is getting worse, subjective fever, drainage [CH]  1508 Xray shows possible osteo [CH]    Clinical Course User Index [AA] Hildegard Loge, PA-C [CH] Hinnant, Collin F,  PA-C                                 Medical Decision Making Amount and/or Complexity of Data Reviewed Labs: ordered. Radiology: ordered.  Risk Prescription drug management.   Medical Decision Making / ED Course   This patient presents to the ED for concern of wound, this involves an extensive number of treatment options, and is a complaint that carries with  it a high risk of complications and morbidity.  The differential diagnosis includes abscess, osteomyelitis, purulent cellulitis  MDM: 78 year old male presents today for concern of left foot wound.  Present for the past 2 weeks however worse in the past couple days with purulence over the same timeframe.  Endorses subjective fever.  See attached image for description of the wound.  Will obtain labs, provide dose of Vanco and obtain blood cultures.  Metabolic panel with sodium of 872 but this is 133 when corrected for the hyperglycemia.  Nurse to send another CBC because previous one clotted off.  X-ray with evidence of soft tissue swelling with potential bony erosion.  In the end of my shift patient is pending CBC.  Patient will require admission for IV antibiotics.  There is concern for osteomyelitis.  He will require MRI.   Additional history obtained: -Additional history obtained from wife at bedside -External records from outside source obtained and reviewed including: Chart review including previous notes, labs, imaging, consultation notes   Lab Tests: -I ordered, reviewed, and interpreted labs.   The pertinent results include:   Labs Reviewed  CULTURE, BLOOD (ROUTINE X 2)  CULTURE, BLOOD (ROUTINE X 2)  CBC WITH DIFFERENTIAL/PLATELET  URINALYSIS, W/ REFLEX TO CULTURE (INFECTION SUSPECTED)  COMPREHENSIVE METABOLIC PANEL WITH GFR  I-STAT CG4 LACTIC ACID, ED      EKG  EKG Interpretation Date/Time:    Ventricular Rate:    PR Interval:    QRS Duration:    QT Interval:    QTC Calculation:   R Axis:       Text Interpretation:           Imaging Studies ordered: I ordered imaging studies including foot x-ray I independently visualized and interpreted imaging. I agree with the radiologist interpretation   Medicines ordered and prescription drug management: Meds ordered this encounter  Medications   DISCONTD: vancomycin  (VANCOCIN ) IVPB 1000 mg/200 mL premix    Antibiotic Indication::   Cellulitis   vancomycin  (VANCOREADY) IVPB 1500 mg/300 mL    Antibiotic Indication::   Cellulitis    -I have reviewed the patients home medicines and have made adjustments as needed  Reevaluation: After the interventions noted above, I reevaluated the patient and found that they have :stayed the same  Co morbidities that complicate the patient evaluation  Past Medical History:  Diagnosis Date   Adrenal insufficiency (HCC)    Allergy    Anemia    Arthritis    feet    Broken foot 09/2019   had to wore a boot. Left    CKD (chronic kidney disease), stage III (HCC)    Coronary artery disease    has stents   Coronary atherosclerosis of native coronary artery    Proximal LAD, posterior lateral stent widely patent-10/12/11   Diabetes mellitus    insulin  and pills   Hearing loss    wears hearing aids   History of blood transfusion 06/30/2016   [name] - 2 units transfused   Hyperlipidemia    Hypertension    OSA (obstructive sleep apnea)    uses VPAC sleep study 2 years done through [facility]. Dr. Jeffrie arranged study   PVD (peripheral vascular disease) (HCC)    Sarcoidosis    Sleep apnea    uses CPAP nightly   Thyroid  disease    Type 2 diabetes mellitus (HCC)       Dispostion: Signout to oncoming provider at the end of my shift pending CBC and then  to call for admission.    Final diagnoses:  None    ED Discharge Orders     None          Hildegard Loge, PA-C 10/09/23 1514    Elnor Jayson LABOR, DO 10/16/23 2325

## 2023-10-09 NOTE — Assessment & Plan Note (Signed)
 -  Continue prednisone

## 2023-10-09 NOTE — Assessment & Plan Note (Addendum)
-  No longer on CPAP

## 2023-10-09 NOTE — Assessment & Plan Note (Signed)
 Continue Zetia 10mg  daily and Crestor 20mg  daily

## 2023-10-09 NOTE — Assessment & Plan Note (Signed)
 Chronic continue hydralazine  10 mg daily and metoprolol  12.5 mg twice daily

## 2023-10-09 NOTE — Assessment & Plan Note (Addendum)
 Obtain urine electrolytes check TSH   Corrected Na 133

## 2023-10-09 NOTE — Progress Notes (Signed)
 Pharmacy Antibiotic Note  UCHECHUKWU DHAWAN is a 78 y.o. male admitted on 10/09/2023 with wound infection. Pharmacy has been consulted for vancomycin  and Unasyn  dosing.  Plan: -S/p vancomycin  1500 mg. Continue with 1250 mg IV q24h -Unasyn  3 g IV q6h  Height: 5' 10 (177.8 cm) Weight: 93 kg (205 lb) IBW/kg (Calculated) : 73  Temp (24hrs), Avg:98.2 F (36.8 C), Min:98.1 F (36.7 C), Max:98.3 F (36.8 C)  Recent Labs  Lab 10/09/23 1314 10/09/23 1852  WBC  --  13.3*  CREATININE 1.58*  --     Estimated Creatinine Clearance: 44.9 mL/min (A) (by C-G formula based on SCr of 1.58 mg/dL (H)).    Allergies  Allergen Reactions   Statins Other (See Comments)    Aggression  Pt ok to take crestor  20mg  once a week   Hydrocortisone  Nausea Only   Ranexa [Ranolazine] Other (See Comments)    Severe weakness/near syncope after 1 dose Hallucinations    Doxycycline  Diarrhea   Hydrocodone  Nausea And Vomiting   Miralax  [Polyethylene Glycol] Nausea And Vomiting   Z-Pak [Azithromycin ] Other (See Comments)    Raises blood sugar   Zaroxolyn  [Metolazone ] Other (See Comments)    Drained, no energy    Antimicrobials this admission: Unasyn  8/4 >> Vancomycin  8/4 >>  Dose adjustments this admission: NA  Microbiology results: 8/4 BCx: pending 8/4 MRSA PCR: ordered  Thank you for allowing pharmacy to be a part of this patient's care.  Stefano MARLA Bologna, PharmD, BCPS Clinical Pharmacist 10/09/2023 8:24 PM

## 2023-10-09 NOTE — Assessment & Plan Note (Signed)
-  chronic avoid nephrotoxic medications such as NSAIDs, Vanco Zosyn combo,  avoid hypotension, continue to follow renal function Genly rehydrate Order urine electrolytes

## 2023-10-09 NOTE — Assessment & Plan Note (Addendum)
 Order sliding scale Order NPH 20 unit BID And SSI Will benefit from diabetes coordinator consult

## 2023-10-09 NOTE — ED Triage Notes (Signed)
 Patient presents due to infection on his left foot. Since Thursday, he has put a prescription cream on his foot, but says symptoms have worsened. He reports chills and nausea as well. This is similar to when he had cellulitis in the past.

## 2023-10-09 NOTE — Assessment & Plan Note (Signed)
 Stable

## 2023-10-09 NOTE — Subjective & Objective (Signed)
 2 wk hx of left foot wound Blister after he stepped on something, He saw podiatrist was started on a walking boot and a cream  2 days ago his whole foot started to look red now up to the knee  There is purulent discharge  Plain imaging could not rule out osteomyelitis Has been having subjective fevers and drainage Given a  dose of vancomycin 

## 2023-10-09 NOTE — Assessment & Plan Note (Signed)
 May need to hold aspirin  and Plavix  if needs operative intervention continue metoprolol  and Crestor 

## 2023-10-09 NOTE — ED Notes (Signed)
 [facility] called and transport setup

## 2023-10-09 NOTE — Assessment & Plan Note (Signed)
-  admit per  cellulitis protocol will          plain films showed:   no evidence of air possible osteomyelitis   no               foreign   objects           Will obtain MRSA screening,       further antibiotic adjustment pending above results

## 2023-10-09 NOTE — ED Notes (Signed)
 Second blood culture drawn from LEFT wrist.

## 2023-10-09 NOTE — Assessment & Plan Note (Signed)
Avoid fluid over load 

## 2023-10-09 NOTE — H&P (Signed)
 [name] FMW:[id number] DOB: [birth year] DOA: 10/09/2023     PCP: Arloa Elsie SAUNDERS, MD   Outpatient Specialists:   CARDS:  Dr. Oneil Parchment, MD    Patient arrived to ER on 10/09/23 at 1228 Referred by Attending Franklyn Sid SAILOR, MD   Patient coming from:    home Lives  With family    Chief Complaint:   Chief Complaint  Patient presents with   Wound Infection    HPI: [name] is a 78 y.o. male with medical history significant of DM2 , adrenal insufficiency, CKD stage III, CAD status post stents, HLD HTN OSA PVD sarcoidosis OSA hypothyroidism, Charcot right foot secondary to diabetes    Presented with increased pain and swelling redness left foot 2 wk hx of left foot wound Blister after he stepped on something, He saw podiatrist was started on a walking boot and a cream  2 days ago his whole foot started to look red now up to the knee  There is purulent discharge  Plain imaging could not rule out osteomyelitis Has been having subjective fevers and drainage Given a  dose of vancomycin     Patient has known history of Charcot  foot followed by [facility] Reports had n?v all night Had not had any of his meds for the past 4 days  Did not take hsi insulin  for the past 4 days  No CP no SOB  He feeels dehydrated Reports some diarrhea 2 days ago Denies significant ETOH intake   Does not smoke      Regarding pertinent Chronic problems:     Hyperlipidemia -  on statins Crestor  and Zetia  Lipid Panel     Component Value Date/Time   CHOL 87 03/18/2022 0412   CHOL 134 01/26/2021 1012   TRIG 172 (H) 03/18/2022 0412   HDL 20 (L) 03/18/2022 0412   HDL 28 (L) 01/26/2021 1012   CHOLHDL 4.4 03/18/2022 0412   VLDL 34 03/18/2022 0412   LDLCALC 33 03/18/2022 0412   LDLCALC 76 01/26/2021 1012   LABVLDL 30 01/26/2021 1012     HTN on metoprolol  and hydralazine    chronic CHF diastolic - last echo  Recent Results (from the past 56199 hours)  ECHOCARDIOGRAM COMPLETE   Collection  Time: 08/02/22  2:13 PM  Result Value   Weight 3,407.99   BP 125/63   S' Lateral 4.50   AR max vel 1.97   AV Peak grad 12.0   Ao pk vel 1.73   Area-P 1/2 3.08   Est EF 55 - 60%   Narrative      ECHOCARDIOGRAM REPORT       IMPRESSIONS    1. Left ventricular ejection fraction, by estimation, is 55 to 60%. The left ventricle has normal function. Left ventricular endocardial border not optimally defined to evaluate regional wall motion. Left ventricular diastolic parameters are consistent  with Grade I diastolic dysfunction (impaired relaxation).  2. Right ventricular systolic function is normal. The right ventricular size is normal. Tricuspid regurgitation signal is inadequate for assessing PA pressure.  3. The mitral valve is grossly normal. Trivial mitral valve regurgitation. No evidence of mitral stenosis.  4. The aortic valve is tricuspid. Aortic valve regurgitation is not visualized. Aortic valve sclerosis is present, with no evidence of aortic valve stenosis.           CAD  - On Aspirin , statin, betablocker, Plavix                  -  followed by cardiology                - last stent 3 yars ago       DM 2 -  Lab Results  Component Value Date   HGBA1C 9.4 (H) 03/17/2022   on insulin ,    Hypothyroidism:   Lab Results  Component Value Date   TSH 0.829 09/14/2021   T3TOTAL 60 (L) 10/03/2018   on synthroid        Asthma -well   controlled on home inhalers/ nebs                        OSA - noncompliant with CPAP    CKD stage IIIa baseline Cr 1.2 Estimated Creatinine Clearance: 44.9 mL/min (A) (by C-G formula based on SCr of 1.58 mg/dL (H)).  Lab Results  Component Value Date   CREATININE 1.58 (H) 10/09/2023   CREATININE 1.40 (H) 08/05/2022   CREATININE 1.23 08/04/2022   Lab Results  Component Value Date   NA 127 (L) 10/09/2023   CL 90 (L) 10/09/2023   K 3.4 (L) 10/09/2023   CO2 22 10/09/2023   BUN 24 (H) 10/09/2023   CREATININE 1.58 (H) 10/09/2023    GFRNONAA 45 (L) 10/09/2023   CALCIUM  8.1 (L) 10/09/2023   PHOS 2.9 08/03/2022   ALBUMIN 2.9 (L) 10/09/2023   GLUCOSE 474 (H) 10/09/2023        Chronic anemia - baseline hg Hemoglobin & Hematocrit  Recent Labs    10/09/23 1852  HGB 12.9*   Iron /TIBC/Ferritin/ %Sat    Component Value Date/Time   IRON  15 (L) 09/14/2021 0845   TIBC 294 09/14/2021 0845   FERRITIN 120.5 07/12/2022 1219   IRONPCTSAT 5 (L) 09/14/2021 0845   IRONPCTSAT 6 (L) 12/30/2019 1502     While in ER: Clinical Course as of 10/09/23 2006  Mon Oct 09, 2023  1438 Metabolic panel resulted.  Shows creatinine of 1.58, glucose level of 474, and hyponatremia with sodium of 127.  Corrected sodium of 133. CBC still pending.  Spoke to lab and they state that the previous sample clotted off and they are needing a new sample.  This has not been sent off yet.  I notified the nurse of this. [AA]  1507 Plan to admit for purulent cellulitis vs. Osteomyelitis, wound on foot x 2 weeks, saw podiatry given cream and put in CAM boot, passed 2 days wound is getting worse, subjective fever, drainage [CH]  1508 Xray shows possible osteo [CH]    Clinical Course User Index [AA] Hildegard Loge, PA-C [CH] Hinnant, Collin F, PA-C       Lab Orders         Blood culture (routine x 2)         CBC with Differential         Urinalysis, w/ Reflex to Culture (Infection Suspected) -Urine, Clean Catch         Comprehensive metabolic panel         CBC with Differential/Platelet         I-Stat Lactic Acid, ED    Left foot  1. Soft tissue wound along the plantar midfoot with diffuse forefoot soft tissue swelling. Possible osseous erosion along the medial base of the first metatarsal. Osteomyelitis cannot be excluded. 2. Charcot foot.    Following Medications were ordered in ER: Medications  vancomycin  (VANCOREADY) IVPB 1500 mg/300 mL (0 mg Intravenous Stopped 10/09/23 1726)  _______________________________________________________ ER Provider  Called:       DrGOMEZ  They Recommend admit to medicine Transfer to [facility] for MRI Will see in AM     ED Triage Vitals  Encounter Vitals Group     BP 10/09/23 1240 99/75     Girls Systolic BP Percentile --      Girls Diastolic BP Percentile --      Boys Systolic BP Percentile --      Boys Diastolic BP Percentile --      Pulse Rate 10/09/23 1240 98     Resp 10/09/23 1240 16     Temp 10/09/23 1240 98.1 F (36.7 C)     Temp Source 10/09/23 1240 Oral     SpO2 10/09/23 1240 96 %     Weight 10/09/23 1239 205 lb (93 kg)     Height 10/09/23 1239 5' 10 (1.778 m)     Head Circumference --      Peak Flow --      Pain Score 10/09/23 1253 8     Pain Loc --      Pain Education --      Exclude from Growth Chart --   UFJK(75)@     _________________________________________ Significant initial  Findings: Abnormal Labs Reviewed  URINALYSIS, W/ REFLEX TO CULTURE (INFECTION SUSPECTED) - Abnormal; Notable for the following components:      Result Value   Glucose, UA >=500 (*)    Hgb urine dipstick MODERATE (*)    All other components within normal limits  COMPREHENSIVE METABOLIC PANEL WITH GFR - Abnormal; Notable for the following components:   Sodium 127 (*)    Potassium 3.4 (*)    Chloride 90 (*)    Glucose, Bld 474 (*)    BUN 24 (*)    Creatinine, Ser 1.58 (*)    Calcium  8.1 (*)    Total Protein 6.1 (*)    Albumin 2.9 (*)    GFR, Estimated 45 (*)    All other components within normal limits  CBC WITH DIFFERENTIAL/PLATELET - Abnormal; Notable for the following components:   WBC 13.3 (*)    RBC 4.04 (*)    Hemoglobin 12.9 (*)    Neutro Abs 10.5 (*)    Monocytes Absolute 1.2 (*)    Abs Immature Granulocytes 0.17 (*)    All other components within normal limits    ECG: Ordered   The recent clinical data is shown below. Vitals:   10/09/23 1240 10/09/23 1530 10/09/23 1649 10/09/23 1819  BP: 99/75 136/66  134/69  Pulse: 98 77  75  Resp: 16 18  18   Temp: 98.1 F (36.7 C)  98.3 F  (36.8 C) 98.1 F (36.7 C)  TempSrc: Oral   Oral  SpO2: 96% 98%  93%  Weight:      Height:          WBC     Component Value Date/Time   WBC 13.3 (H) 10/09/2023 1852   LYMPHSABS 1.0 10/09/2023 1852   MONOABS 1.2 (H) 10/09/2023 1852   EOSABS 0.3 10/09/2023 1852   BASOSABS 0.1 10/09/2023 1852      UA   no evidence of UTI     Urine analysis:    Component Value Date/Time   COLORURINE YELLOW 10/09/2023 1818   APPEARANCEUR CLEAR 10/09/2023 1818   LABSPEC 1.012 10/09/2023 1818   PHURINE 5.0 10/09/2023 1818   GLUCOSEU >=500 (A) 10/09/2023 1818   HGBUR MODERATE (A) 10/09/2023 1818  BILIRUBINUR NEGATIVE 10/09/2023 1818   KETONESUR NEGATIVE 10/09/2023 1818   PROTEINUR NEGATIVE 10/09/2023 1818   UROBILINOGEN 0.2 11/09/2010 2146   NITRITE NEGATIVE 10/09/2023 1818   LEUKOCYTESUR NEGATIVE 10/09/2023 1818    Results for orders placed or performed during the hospital encounter of 10/09/23  Blood culture (routine x 2)     Status: None (Preliminary result)   Collection Time: 10/09/23  2:30 PM   Specimen: BLOOD LEFT ARM  Result Value Ref Range Status   Specimen Description   Final    BLOOD LEFT ARM Performed at [facility], [address] [place], [place] [address]    Special Requests   Final    BOTTLES DRAWN AEROBIC AND ANAEROBIC Blood Culture results may not be optimal due to an inadequate volume of blood received in culture bottles Performed at [facility], [address], [place], [place] [address]    Culture PENDING  Incomplete   Report Status PENDING  Incomplete    ABX started Antibiotics Given (last 72 hours)     Date/Time Action Medication Dose Rate   10/09/23 1440 New Bag/Given   vancomycin  (VANCOREADY) IVPB 1500 mg/300 mL 1,500 mg 150 mL/hr         __________________________________________________________ Recent Labs  Lab 10/09/23 1314  NA 127*  K 3.4*  CO2 22  GLUCOSE 474*  BUN 24*  CREATININE 1.58*  CALCIUM  8.1*    Cr   Up from baseline see below Lab Results  Component Value Date   CREATININE 1.58 (H) 10/09/2023   CREATININE 1.40 (H) 08/05/2022   CREATININE 1.23 08/04/2022    Recent Labs  Lab 10/09/23 1314  AST 23  ALT 15  ALKPHOS 54  BILITOT 1.2  PROT 6.1*  ALBUMIN 2.9*   Lab Results  Component Value Date   CALCIUM  8.1 (L) 10/09/2023   PHOS 2.9 08/03/2022    Plt: Lab Results  Component Value Date   PLT 192 10/09/2023       Recent Labs  Lab 10/09/23 1852  WBC 13.3*  NEUTROABS 10.5*  HGB 12.9*  HCT 39.7  MCV 98.3  PLT 192    HG/HCT   stable,     Component Value Date/Time   HGB 12.9 (L) 10/09/2023 1852   HGB 11.6 (L) 12/28/2021 1252   HCT 39.7 10/09/2023 1852   HCT 37.9 12/28/2021 1252   MCV 98.3 10/09/2023 1852   MCV 90 12/28/2021 1252     _______________________________________________ Hospitalist was called for admission for   Wound infection     The following Work up has been ordered so far:  Orders Placed This Encounter  Procedures   Blood culture (routine x 2)   DG Foot Complete Left   CBC with Differential   Urinalysis, w/ Reflex to Culture (Infection Suspected) -Urine, Clean Catch   Comprehensive metabolic panel   CBC with Differential/Platelet   Initiate Carrier Fluid Protocol   Consult to hospitalist   I-Stat Lactic Acid, ED   Insert peripheral IV     OTHER Significant initial  Findings:  labs showing:     DM  labs:  HbA1C: No results for input(s): HGBA1C in the last 8760 hours.     CBG (last 3)  No results for input(s): GLUCAP in the last 72 hours.        Cultures:    Component Value Date/Time   SDES  10/09/2023 1430    BLOOD LEFT ARM Performed at [facility], [address], [place], [place] [address]    SPECREQUEST  10/09/2023 1430    BOTTLES DRAWN AEROBIC AND ANAEROBIC Blood Culture results may not be optimal due to an inadequate volume of blood received in culture bottles Performed at [facility],  [address] [place], [place] [address]    CULT PENDING 10/09/2023 1430   REPTSTATUS PENDING 10/09/2023 1430     Radiological Exams on Admission: DG Foot Complete Left Result Date: 10/09/2023 CLINICAL DATA:  Wound infection with worsening pain. EXAM: LEFT FOOT - COMPLETE 3+ VIEW COMPARISON:  None Available. FINDINGS: Soft tissue regularity and thickening along the plantar aspect of the mid and forefoot with forefoot soft tissue swelling. Difficult to exclude a tiny erosion along the medial base of the first metatarsal. Charcot foot changes. Two screws traverse a fused first metatarsophalangeal joint. Plantar calcaneal spur. IMPRESSION: 1. Soft tissue wound along the plantar midfoot with diffuse forefoot soft tissue swelling. Possible osseous erosion along the medial base of the first metatarsal. Osteomyelitis cannot be excluded. 2. Charcot foot. Electronically Signed   By: Newell Eke M.D.   On: 10/09/2023 14:25   _______________________________________________________________________________________________________ Latest  Blood pressure 134/69, pulse 75, temperature 98.1 F (36.7 C), temperature source Oral, resp. rate 18, height 5' 10 (1.778 m), weight 93 kg, SpO2 93%.   Vitals  labs and radiology finding personally reviewed  Review of Systems:    Pertinent positives include:   chills, fatigue, nausea,  Constitutional:  No weight loss, night sweats, Fevers, weight loss  HEENT:  No headaches, Difficulty swallowing,Tooth/dental problems,Sore throat,  No sneezing, itching, ear ache, nasal congestion, post nasal drip,  Cardio-vascular:  No chest pain, Orthopnea, PND, anasarca, dizziness, palpitations.no Bilateral lower extremity swelling  GI:  No heartburn, indigestion, abdominal pain, vomiting, diarrhea, change in bowel habits, loss of appetite, melena, blood in stool, hematemesis Resp:  no shortness of breath at rest. No dyspnea on exertion, No excess mucus, no productive cough,  No non-productive cough, No coughing up of blood.No change in color of mucus.No wheezing. Skin:  no rash or lesions. No jaundice GU:  no dysuria, change in color of urine, no urgency or frequency. No straining to urinate.  No flank pain.  Musculoskeletal:  No joint pain or no joint swelling. No decreased range of motion. No back pain.  Psych:  No change in mood or affect. No depression or anxiety. No memory loss.  Neuro: no localizing neurological complaints, no tingling, no weakness, no double vision, no gait abnormality, no slurred speech, no confusion  All systems reviewed and apart from HOPI all are negative _______________________________________________________________________________________________ Past Medical History:   Past Medical History:  Diagnosis Date   Adrenal insufficiency (HCC)    Allergy    Anemia    Arthritis    feet    Broken foot 09/2019   had to wore a boot. Left    CKD (chronic kidney disease), stage III (HCC)    Coronary artery disease    has stents   Coronary atherosclerosis of native coronary artery    Proximal LAD, posterior lateral stent widely patent-10/12/11   Diabetes mellitus    insulin  and pills   Hearing loss    wears hearing aids   History of blood transfusion 06/30/2016   [name] - 2 units transfused   Hyperlipidemia    Hypertension    OSA (obstructive sleep apnea)    uses VPAC sleep study 2 years done through [facility]. Dr. Jeffrie arranged study   PVD (  peripheral vascular disease) (HCC)    Sarcoidosis    Sleep apnea    uses CPAP nightly   Thyroid  disease    Type 2 diabetes mellitus (HCC)     Past Surgical History:  Procedure Laterality Date   APPENDECTOMY     CATARACT EXTRACTION  2011   bilat   CHOLECYSTECTOMY  01/25/2011   Procedure: LAPAROSCOPIC CHOLECYSTECTOMY WITH INTRAOPERATIVE CHOLANGIOGRAM;  Surgeon: Redell Alm Faith, DO;  Location: [facility] OR;  Service: General;  Laterality: N/A;   COLONOSCOPY     several   COLONOSCOPY  WITH PROPOFOL  N/A 01/03/2020   Procedure: COLONOSCOPY WITH PROPOFOL ;  Surgeon: Avram Lupita BRAVO, MD;  Location: [facility] ENDOSCOPY;  Service: Endoscopy;  Laterality: N/A;   CORONARY ANGIOPLASTY     most recent 11/2009   CORONARY PRESSURE/FFR STUDY N/A 01/18/2021   Procedure: INTRAVASCULAR PRESSURE WIRE/FFR STUDY;  Surgeon: Wonda Sharper, MD;  Location: [facility] INVASIVE CV LAB;  Service: Cardiovascular;  Laterality: N/A;   CORONARY STENT INTERVENTION N/A 08/07/2018   Procedure: CORONARY STENT INTERVENTION;  Surgeon: Dann Candyce RAMAN, MD;  Location: [facility] INVASIVE CV LAB;  Service: Cardiovascular;  Laterality: N/A;   CORONARY STENT INTERVENTION N/A 01/18/2021   Procedure: CORONARY STENT INTERVENTION;  Surgeon: Wonda Sharper, MD;  Location: [facility] INVASIVE CV LAB;  Service: Cardiovascular;  Laterality: N/A;   CORONARY STENT PLACEMENT  2009   in LAD and side branch PTCA   ESOPHAGOGASTRODUODENOSCOPY (EGD) WITH PROPOFOL  N/A 01/03/2020   Procedure: ESOPHAGOGASTRODUODENOSCOPY (EGD) WITH PROPOFOL ;  Surgeon: Avram Lupita BRAVO, MD;  Location: [facility]OSCOPY;  Service: Endoscopy;  Laterality: N/A;   HEMOSTASIS CLIP PLACEMENT  01/03/2020   Procedure: HEMOSTASIS CLIP PLACEMENT;  Surgeon: Avram Lupita BRAVO, MD;  Location: [facility]OSCOPY;  Service: Endoscopy;;   INTERCOSTAL NERVE BLOCK  2011, 06/2016   x2. lumbar spine   LEFT HEART CATHETERIZATION WITH CORONARY ANGIOGRAM Bilateral 10/12/2011   Procedure: LEFT HEART CATHETERIZATION WITH CORONARY ANGIOGRAM;  Surgeon: Oneil Parchment, MD;  Location: [facility] CATH LAB;  Service: Cardiovascular;  Laterality: Bilateral;   LEFT HEART CATHETERIZATION WITH CORONARY ANGIOGRAM N/A 04/01/2014   Procedure: LEFT HEART CATHETERIZATION WITH CORONARY ANGIOGRAM;  Surgeon: Oneil Parchment, MD;  Location: [facility] CATH LAB;  Service: Cardiovascular;  Laterality: N/A;   POLYPECTOMY  01/03/2020   Procedure: POLYPECTOMY;  Surgeon: Avram Lupita BRAVO, MD;  Location: [facility] ENDOSCOPY;  Service: Endoscopy;;   RIGHT/LEFT HEART CATH AND  CORONARY ANGIOGRAPHY N/A 08/07/2018   Procedure: RIGHT/LEFT HEART CATH AND CORONARY ANGIOGRAPHY;  Surgeon: Dann Candyce RAMAN, MD;  Location: [facility] INVASIVE CV LAB;  Service: Cardiovascular;  Laterality: N/A;   RIGHT/LEFT HEART CATH AND CORONARY ANGIOGRAPHY N/A 12/25/2020   Procedure: RIGHT/LEFT HEART CATH AND CORONARY ANGIOGRAPHY;  Surgeon: Claudene Victory ORN, MD;  Location: [facility] INVASIVE CV LAB;  Service: Cardiovascular;  Laterality: N/A;   TRANSFORAMINAL LUMBAR INTERBODY FUSION W/ MIS 1 LEVEL N/A 09/24/2021   Procedure: LUMBAR FOUR-FIVE MINIMALLY INVASIVE (MIS) TRANSFORAMINAL LUMBAR INTERBODY FUSION (TLIF) WITH METRX;  Surgeon: Cheryle Debby LABOR, MD;  Location: [facility] OR;  Service: Neurosurgery;  Laterality: N/A;    Social History:  Ambulatory  walker  wheelchair      reports that he has never smoked. He has never used smokeless tobacco. He reports that he does not drink alcohol and does not use drugs.    Family History:   Family History  Problem Relation Age of Onset   Kidney disease Mother    Diabetes Mother    Kidney cancer Mother    Heart attack Father  Asthma Sister    Anesthesia problems Sister        Kidney's did not wake up   Sarcoidosis Sister    Sarcoidosis Niece    Colon cancer Neg Hx    Colon polyps Neg Hx    Rectal cancer Neg Hx    Stomach cancer Neg Hx    ______________________________________________________________________________________________ Allergies: Allergies  Allergen Reactions   Statins Other (See Comments)    Aggression  Pt ok to take crestor  20mg  once a week   Hydrocortisone  Nausea Only   Ranexa [Ranolazine] Other (See Comments)    Severe weakness/near syncope after 1 dose Hallucinations    Doxycycline  Diarrhea   Hydrocodone  Nausea And Vomiting   Miralax  [Polyethylene Glycol] Nausea And Vomiting   Z-Pak Gabi.Gallon ] Other (See Comments)    Raises blood sugar   Zaroxolyn  [Metolazone ] Other (See Comments)    Drained, no energy     Prior to  Admission medications   Medication Sig Start Date End Date Taking? Authorizing Provider  acetaminophen  (TYLENOL ) 500 MG tablet Take 1,000 mg by mouth daily as needed for mild pain (pain).    [provider]  albuterol  (PROVENTIL ) (2.5 MG/3ML) 0.083% nebulizer solution Take 3 mLs (2.5 mg total) by nebulization every 6 (six) hours as needed for wheezing or shortness of breath. Dx Code D86.0 07/24/14   Brien Belvie BRAVO, MD  aspirin  EC 81 MG tablet Take 81 mg by mouth every evening.     [provider]  clopidogrel  (PLAVIX ) 75 MG tablet Take 1 tablet (75 mg total) by mouth daily. Restart on 09/28/21 09/25/21   Maree Bracken D, DO  ezetimibe  (ZETIA ) 10 MG tablet Take 10 mg by mouth in the morning. 06/25/21   [provider]  furosemide  (LASIX ) 80 MG tablet Take 1 tablet (80 mg total) by mouth daily as needed for edema (take Lasix  80 mg as needed if gains more than 3 pounds in 1 day or 5 pounds in 1 week). 10/21/21   Christobal Guadalajara, MD  hydrALAZINE  (APRESOLINE ) 10 MG tablet Take 10 mg by mouth in the morning and at bedtime.    [provider]  insulin  aspart protamine- aspart (NOVOLOG  MIX 70/30) (70-30) 100 UNIT/ML injection Inject 0.4 mLs (40 Units total) into the skin with breakfast, with lunch, and with evening meal. 09/16/21   Vann, Jessica U, DO  Iron , Ferrous Sulfate , 325 (65 Fe) MG TABS Take 325 mg by mouth daily. 01/18/22   Avram Lupita BRAVO, MD  levothyroxine  (SYNTHROID ) 75 MCG tablet Take 75 mcg by mouth in the morning. 02/24/21   [provider]  metoprolol  tartrate (LOPRESSOR ) 25 MG tablet TAKE 0.5 TABLETS BY MOUTH 2 TIMES DAILY. 02/13/23   Jeffrie Oneil BROCKS, MD  mometasone  (ELOCON ) 0.1 % cream Apply topical daily as needed for pain/itch 07/07/23   Karis Clunes, MD  montelukast  (SINGULAIR ) 10 MG tablet Take 10 mg by mouth daily as needed (allergies). 07/18/18   [provider]  potassium chloride  (KLOR-CON  M) 10 MEQ tablet Take 10 mEq by mouth in the morning.     [provider]  predniSONE  (DELTASONE ) 10 MG tablet Take 10 mg by mouth in the morning. Continuous course.    [provider]  RABEprazole  (ACIPHEX ) 20 MG tablet Take 20 mg by mouth daily as needed (GERD). 09/28/21   [provider]  rosuvastatin  (CRESTOR ) 20 MG tablet TAKE 1 TABLET AT BEDTIME 09/25/23   Jeffrie Oneil BROCKS, MD  silver sulfADIAZINE (SILVADENE) 1 % cream  Apply 1 Application topically daily. Apply to leg 07/21/22   [provider]    ___________________________________________________________________________________________________ Physical Exam:    10/09/2023    6:19 PM 10/09/2023    3:30 PM 10/09/2023   12:40 PM  Vitals with BMI  Systolic 134 136 99  Diastolic 69 66 75  Pulse 75 77 98     1. General:  in No  Acute distress   Chronically ill   -appearing 2. Psychological: Alert and   Oriented 3. Head/ENT: Dry Mucous Membranes                          Head Non traumatic, neck supple                      Poor Dentition 4. SKIN: decreased Skin turgor,  Skin clean Dry         5. Heart: Regular rate and rhythm no  Murmur, no Rub or gallop 6. Lungs:   no wheezes or crackles   7. Abdomen: Soft,  non-tender, Non distended bowel sounds present 8. Lower extremities: no clubbing, cyanosis, no  edema 9. Neurologically Grossly intact, moving all 4 extremities equally   10. MSK: Normal range of motion    Chart has been reviewed  ______________________________________________________________________________________________  Assessment/Plan 78 y.o. male with medical history significant of DM2 , adrenal insufficiency, CKD stage III, CAD status post stents, HLD HTN OSA PVD sarcoidosis OSA hypothyroidism, Charcot right foot secondary to diabetes  Admitted for   Wound infection   Present on Admission:  Acute renal failure superimposed on stage 3a chronic kidney disease (HCC)  Chronic diastolic heart failure (HCC)  Coronary atherosclerosis of  native coronary artery  GERD  HTN (hypertension)  Hyperlipidemia  OSA (obstructive sleep apnea)  Hyponatremia  Cellulitis  Sarcoidosis     Acute renal failure superimposed on stage 3a chronic kidney disease (HCC)  -chronic avoid nephrotoxic medications such as NSAIDs, Vanco Zosyn combo,  avoid hypotension, continue to follow renal function Genly rehydrate Order urine electrolytes  Cellulitis of left lower extremity -admit per  cellulitis protocol will          plain films showed:   no evidence of air possible osteomyelitis   no               foreign   objects           Will obtain MRSA screening,       further antibiotic adjustment pending above results   Chronic diastolic heart failure (HCC) Avoid fluid overload  Coronary atherosclerosis of native coronary artery May need to hold aspirin  and Plavix  if needs operative intervention continue metoprolol  and Crestor   GERD Stable  HTN (hypertension) Chronic continue hydralazine  10 mg daily and metoprolol  12.5 mg twice daily  Hyperlipidemia Continue Zetia  10 mg daily and Crestor  20 mg daily  OSA (obstructive sleep apnea) No longer on CPAP  Uncontrolled type 2 diabetes mellitus with hyperglycemia, with long-term current use of insulin  (HCC) Order sliding scale Order NPH 20 unit BID And SSI Will benefit from diabetes coordinator consult  Hyponatremia Obtain urine electrolytes check TSH   Corrected Na 133  History of adrenal insufficiency Continue prednisone   Sarcoidosis Continue prednisone    Other plan as per orders.  DVT prophylaxis:  SCD      Code Status:    Code Status: Prior FULL CODE   as per patient   I had personally discussed CODE  STATUS with patient and family   ACP   none     Family Communication:   Family  at  Bedside  plan of care was discussed  with   Wife,   Diet  daibetic diet   Disposition Plan:     To home once workup is complete and patient is stable   Following barriers for  discharge:                                                      Electrolytes corrected                              Pain controlled with PO medications                             white count improving able to transition to PO antibiotics                                                       Will need consultants to evaluate patient prior to discharge                                Consult Orders  (From admission, onward)           Start     Ordered   10/09/23 1922  Consult to hospitalist  Once       Provider:  (Not yet assigned)  Question Answer Comment  Place call to: [facility]   Reason for Consult Admit      10/09/23 1921                               Would benefit from PT/OT eval prior to DC  Ordered                                    Diabetes care coordinator                   Consults called: Podiatry   Admission status:  ED Disposition     ED Disposition  Admit   Condition  --   Comment  Hospital [facility] [[id number]]  Level of Care: Progressive [102]  Admit to Progressive based on following criteria: MULTISYSTEM THREATS such as stable sepsis, metabolic/electrolyte imbalance with or without encephalopathy that is responding to early treatment.  May admit patient to [facility] or [name] if equivalent level of care is available:: No  Covid Evaluation: Asymptomatic - no recent exposure (last 10 days) testing not required  Diagnosis: Cellulitis [807680]  Admitting Physician: Louay Myrie [[id number]]  Attending Physician: Terius Jacuinde [[id number]]  Certification:: I certify this patient will need inpatient services for at least 2 midnights  Expected Medical Readiness: 10/12/2023             inpatient     I  Expect 2 midnight stay secondary to severity of patient's current illness need for inpatient interventions justified by the following:     Severe lab/radiological/exam abnormalities including:    Wound  infection    and extensive comorbidities including:    DM2    CHF  CAD    CKD   That are currently affecting medical management.   I expect  patient to be hospitalized for 2 midnights requiring inpatient medical care.  Patient is at high risk for adverse outcome (such as loss of life or disability) if not treated.  Indication for inpatient stay as follows:   Need for operative/procedural  intervention    Need for IV antibiotics, IV fluids,,     Level of care     progressive     Irish Breisch 10/09/2023, 9:18 PM    [facility]     after 2 AM please page floor coverage   If 7AM-7PM, please contact the day team taking care of the patient using [email or web address]

## 2023-10-10 ENCOUNTER — Inpatient Hospital Stay (HOSPITAL_COMMUNITY)

## 2023-10-10 DIAGNOSIS — M86172 Other acute osteomyelitis, left ankle and foot: Secondary | ICD-10-CM | POA: Diagnosis not present

## 2023-10-10 DIAGNOSIS — M79605 Pain in left leg: Secondary | ICD-10-CM | POA: Diagnosis not present

## 2023-10-10 DIAGNOSIS — L03116 Cellulitis of left lower limb: Secondary | ICD-10-CM

## 2023-10-10 DIAGNOSIS — E785 Hyperlipidemia, unspecified: Secondary | ICD-10-CM

## 2023-10-10 DIAGNOSIS — E11621 Type 2 diabetes mellitus with foot ulcer: Secondary | ICD-10-CM

## 2023-10-10 DIAGNOSIS — I2510 Atherosclerotic heart disease of native coronary artery without angina pectoris: Secondary | ICD-10-CM

## 2023-10-10 DIAGNOSIS — D860 Sarcoidosis of lung: Secondary | ICD-10-CM

## 2023-10-10 DIAGNOSIS — L97424 Non-pressure chronic ulcer of left heel and midfoot with necrosis of bone: Secondary | ICD-10-CM

## 2023-10-10 DIAGNOSIS — L97509 Non-pressure chronic ulcer of other part of unspecified foot with unspecified severity: Secondary | ICD-10-CM

## 2023-10-10 DIAGNOSIS — I10 Essential (primary) hypertension: Secondary | ICD-10-CM

## 2023-10-10 LAB — GLUCOSE, CAPILLARY
Glucose-Capillary: 171 mg/dL — ABNORMAL HIGH (ref 70–99)
Glucose-Capillary: 198 mg/dL — ABNORMAL HIGH (ref 70–99)
Glucose-Capillary: 260 mg/dL — ABNORMAL HIGH (ref 70–99)
Glucose-Capillary: 296 mg/dL — ABNORMAL HIGH (ref 70–99)
Glucose-Capillary: 369 mg/dL — ABNORMAL HIGH (ref 70–99)
Glucose-Capillary: 410 mg/dL — ABNORMAL HIGH (ref 70–99)
Glucose-Capillary: 422 mg/dL — ABNORMAL HIGH (ref 70–99)

## 2023-10-10 LAB — COMPREHENSIVE METABOLIC PANEL WITH GFR
ALT: 17 U/L (ref 0–44)
AST: 24 U/L (ref 15–41)
Albumin: 2.4 g/dL — ABNORMAL LOW (ref 3.5–5.0)
Alkaline Phosphatase: 49 U/L (ref 38–126)
Anion gap: 9 (ref 5–15)
BUN: 16 mg/dL (ref 8–23)
CO2: 25 mmol/L (ref 22–32)
Calcium: 8.2 mg/dL — ABNORMAL LOW (ref 8.9–10.3)
Chloride: 102 mmol/L (ref 98–111)
Creatinine, Ser: 1.06 mg/dL (ref 0.61–1.24)
GFR, Estimated: >60 mL/min (ref >60)
Glucose, Bld: 191 mg/dL — ABNORMAL HIGH (ref 70–99)
Potassium: 3.3 mmol/L — ABNORMAL LOW (ref 3.5–5.1)
Sodium: 136 mmol/L (ref 135–145)
Total Bilirubin: 1.1 mg/dL (ref 0.0–1.2)
Total Protein: 5.7 g/dL — ABNORMAL LOW (ref 6.5–8.1)

## 2023-10-10 LAB — BASIC METABOLIC PANEL WITH GFR
Anion gap: 8 (ref 5–15)
BUN: 16 mg/dL (ref 8–23)
CO2: 25 mmol/L (ref 22–32)
Calcium: 8.3 mg/dL — ABNORMAL LOW (ref 8.9–10.3)
Chloride: 98 mmol/L (ref 98–111)
Creatinine, Ser: 1.19 mg/dL (ref 0.61–1.24)
GFR, Estimated: >60 mL/min (ref >60)
Glucose, Bld: 378 mg/dL — ABNORMAL HIGH (ref 70–99)
Potassium: 3.8 mmol/L (ref 3.5–5.1)
Sodium: 131 mmol/L — ABNORMAL LOW (ref 135–145)

## 2023-10-10 LAB — CBC
HCT: 37.4 % — ABNORMAL LOW (ref 39.0–52.0)
Hemoglobin: 12.7 g/dL — ABNORMAL LOW (ref 13.0–17.0)
MCH: 33 pg (ref 26.0–34.0)
MCHC: 34 g/dL (ref 30.0–36.0)
MCV: 97.1 fL (ref 80.0–100.0)
Platelets: 201 K/uL (ref 150–400)
RBC: 3.85 MIL/uL — ABNORMAL LOW (ref 4.22–5.81)
RDW: 13.2 % (ref 11.5–15.5)
WBC: 11.2 K/uL — ABNORMAL HIGH (ref 4.0–10.5)
nRBC: 0 % (ref 0.0–0.2)

## 2023-10-10 LAB — PREALBUMIN: Prealbumin: 9 mg/dL — ABNORMAL LOW (ref 18–38)

## 2023-10-10 LAB — OSMOLALITY: Osmolality: 302 mosm/kg — ABNORMAL HIGH (ref 275–295)

## 2023-10-10 LAB — MAGNESIUM: Magnesium: 1.6 mg/dL — ABNORMAL LOW (ref 1.7–2.4)

## 2023-10-10 LAB — CORTISOL: Cortisol, Plasma: 10.8 ug/dL

## 2023-10-10 LAB — OSMOLALITY, URINE: Osmolality, Ur: 355 mosm/kg (ref 300–900)

## 2023-10-10 LAB — C-REACTIVE PROTEIN: CRP: 20.1 mg/dL — ABNORMAL HIGH (ref <1.0)

## 2023-10-10 LAB — PHOSPHORUS: Phosphorus: 2.1 mg/dL — ABNORMAL LOW (ref 2.5–4.6)

## 2023-10-10 MED ORDER — POTASSIUM CHLORIDE 10 MEQ/100ML IV SOLN
10.0000 meq | INTRAVENOUS | Status: AC
  Administered 2023-10-10 (×2): 10 meq via INTRAVENOUS
  Filled 2023-10-10: qty 100

## 2023-10-10 MED ORDER — GABAPENTIN 100 MG PO CAPS
100.0000 mg | ORAL_CAPSULE | Freq: Every day | ORAL | Status: DC
  Administered 2023-10-10: 100 mg via ORAL
  Filled 2023-10-10 (×3): qty 1

## 2023-10-10 MED ORDER — POTASSIUM PHOSPHATES 15 MMOLE/5ML IV SOLN
15.0000 mmol | Freq: Once | INTRAVENOUS | Status: AC
  Administered 2023-10-10: 15 mmol via INTRAVENOUS
  Filled 2023-10-10: qty 5

## 2023-10-10 MED ORDER — ACETAMINOPHEN 500 MG PO TABS: 1000.0000 mg | ORAL_TABLET | Freq: Three times a day (TID) | ORAL | Status: DC | PRN

## 2023-10-10 MED ORDER — ROSUVASTATIN CALCIUM 20 MG PO TABS
20.0000 mg | ORAL_TABLET | Freq: Every day | ORAL | Status: DC
  Administered 2023-10-10 – 2023-10-12 (×3): 20 mg via ORAL
  Filled 2023-10-10 (×3): qty 1

## 2023-10-10 MED ORDER — PANTOPRAZOLE SODIUM 40 MG PO TBEC
40.0000 mg | DELAYED_RELEASE_TABLET | Freq: Every day | ORAL | Status: DC
  Administered 2023-10-10 – 2023-10-13 (×4): 40 mg via ORAL
  Filled 2023-10-10 (×5): qty 1

## 2023-10-10 MED ORDER — VANCOMYCIN HCL 1750 MG/350ML IV SOLN
1750.0000 mg | INTRAVENOUS | Status: DC
  Filled 2023-10-10: qty 350

## 2023-10-10 MED ORDER — FENTANYL CITRATE PF 50 MCG/ML IJ SOSY: 12.5000 ug | PREFILLED_SYRINGE | INTRAMUSCULAR | Status: DC | PRN

## 2023-10-10 MED ORDER — POTASSIUM CHLORIDE CRYS ER 20 MEQ PO TBCR
40.0000 meq | EXTENDED_RELEASE_TABLET | Freq: Once | ORAL | Status: AC
  Administered 2023-10-10: 40 meq via ORAL
  Filled 2023-10-10: qty 2

## 2023-10-10 MED ORDER — OXYCODONE HCL 5 MG PO TABS
5.0000 mg | ORAL_TABLET | Freq: Four times a day (QID) | ORAL | Status: DC | PRN
  Administered 2023-10-11: 10 mg via ORAL
  Filled 2023-10-10: qty 2

## 2023-10-10 NOTE — Evaluation (Signed)
 Occupational Therapy Evaluation Patient Details Name: [name] MRN: [id number] DOB: Aug 25, 1945 Today's Date: 10/10/2023   History of Present Illness   Pt is a 78 y.o. male admitted with LLE cellulitis. PMH significant for HTN, HLD, CAD with multiple stents, diastolic CHF, GERD, anemia, OSA not on CPAP, PAD, pneumonia, & hypothyroidism.     Clinical Impressions Pt admitted with above diagnosis. Prior to admission, pt lives with spouse and was independent - mod independent with ADLs. Uses a variety of AD for mobility depending on day/situation - at home, typically does not use AD, uses rollator/scooter for community mobility and RW intermittently. Pt appears to be at or near functional baseline on OT evaluation. Pt received sitting EOB upon arrival, donned/doffed L CAM boot and R shoe without LOB, and performed functional STS transfers using RW with CGA for safety + cues for hand placement, mobility using RW short distance to door and back to bed in room without LOB. Do not anticipate OT needs following this hospitalization, OT to complete orders and sign off.      If plan is discharge home, recommend the following:   Assist for transportation;Help with stairs or ramp for entrance;Assistance with cooking/housework;A little help with bathing/dressing/bathroom;A little help with walking and/or transfers     Functional Status Assessment   Patient has not had a recent decline in their functional status     Equipment Recommendations   None recommended by OT      Precautions/Restrictions   Precautions Precautions: Fall Required Braces or Orthoses: Other Brace Other Brace: pt has CAM boot for LLE Restrictions Weight Bearing Restrictions Per Provider Order: No     Mobility Bed Mobility Overal bed mobility: Modified Independent             General bed mobility comments: pt seated EOB upon arrival    Transfers Overall transfer level: Modified independent Equipment  used: Rolling walker (2 wheels) Transfers: Sit to/from Stand Sit to Stand: Contact guard assist           General transfer comment: CGA for safety, no physical assist. Cues for hand placement. Able to stand without use of RW.      Balance Overall balance assessment: Mild deficits observed, not formally tested                                         ADL either performed or assessed with clinical judgement   ADL Overall ADL's : At baseline;Modified independent                                       General ADL Comments: Pt able to don L CAM boot and shoe seated EOB without LOB     Vision Baseline Vision/History: 1 Wears glasses Ability to See in Adequate Light: 0 Adequate Patient Visual Report: No change from baseline              Pertinent Vitals/Pain Pain Assessment Pain Assessment: Faces Faces Pain Scale: Hurts a little bit Pain Location: bottom of foot Pain Descriptors / Indicators: Discomfort, Dull, Grimacing Pain Intervention(s): Limited activity within patient's tolerance, Monitored during session, Repositioned     Extremity/Trunk Assessment Upper Extremity Assessment Upper Extremity Assessment: Overall WFL for tasks assessed;Right hand dominant   Lower Extremity Assessment Lower Extremity Assessment: Defer to  PT evaluation   Cervical / Trunk Assessment Cervical / Trunk Assessment: Kyphotic   Communication Communication Communication: No apparent difficulties   Cognition Arousal: Alert Behavior During Therapy: WFL for tasks assessed/performed Cognition: No apparent impairments                               Following commands: Intact       Cueing  General Comments   Cueing Techniques: Verbal cues  CAM boot donned for mobility           Home Living Family/patient expects to be discharged to:: Private residence Living Arrangements: Spouse/significant other Available Help at Discharge:  Family;Available 24 hours/day Type of Home: House Home Access: Ramped entrance     Home Layout: One level     Bathroom Shower/Tub: Producer, television/film/video: Handicapped height Bathroom Accessibility: Yes   Home Equipment: Agricultural consultant (2 wheels);Rollator (4 wheels);Wheelchair - manual;Electric scooter;BSC/3in1;Grab bars - tub/shower;Grab bars - toilet;Hand held shower head;Shower seat;Shower seat - built in;Hospital bed;Wheelchair - power;Adaptive equipment;Cane - single point Adaptive Equipment: Reacher;Sock aid;Long-handled sponge        Prior Functioning/Environment Prior Level of Function : Independent/Modified Independent             Mobility Comments: no AD inside home, uses rollator/scooter for community mobility, RW occassionally ADLs Comments: independent    OT Problem List: Pain;Decreased strength;Decreased activity tolerance        OT Goals(Current goals can be found in the care plan section)   Acute Rehab OT Goals OT Goal Formulation: All assessment and education complete, DC therapy Potential to Achieve Goals: Good   AM-PAC OT 6 Clicks Daily Activity     Outcome Measure Help from another person eating meals?: None Help from another person taking care of personal grooming?: None Help from another person toileting, which includes using toliet, bedpan, or urinal?: A Little Help from another person bathing (including washing, rinsing, drying)?: A Little Help from another person to put on and taking off regular upper body clothing?: None Help from another person to put on and taking off regular lower body clothing?: None 6 Click Score: 22   End of Session Equipment Utilized During Treatment: Rolling walker (2 wheels) Nurse Communication: Mobility status  Activity Tolerance: Patient tolerated treatment well Patient left: in bed;with call bell/phone within reach;with family/visitor present  OT Visit Diagnosis: Unsteadiness on feet  (R26.81);Other abnormalities of gait and mobility (R26.89);Pain Pain - Right/Left: Left Pain - part of body: Ankle and joints of foot                Time: 8962-8947 OT Time Calculation (min): 15 min Charges:  OT General Charges $OT Visit: 1 Visit OT Evaluation $OT Eval Low Complexity: 1 Low  Walker Sitar L. Quintavia Rogstad, OTR/L  10/10/23, 11:38 AM

## 2023-10-10 NOTE — Consult Note (Addendum)
 Hospital Consult    Reason for Consult:  L foot diabetic ulcer Requesting Physician: [facility] MRN #:  [id number]  History of Present Illness: This is a 78 y.o. male with past medical history significant for type 2 diabetes mellitus, hyperlipidemia, hypertension, CAD with history of stenting, and pulmonary sarcoidosis.  He is being seen in consultation for evaluation of diabetic ulcer on the left plantar foot.  Ulcer began when stepping on a rock while vacationing in [place] 2 weeks ago.  Workup includes ABIs which demonstrates noncompressible tibial vessels.  He has a left toe pressure of 41 mmHg.  He denies claudication.  He is unsure of his last hemoglobin A1c however admittedly believes his diabetes is not well-controlled given his need for steroids to manage pulmonary sarcoidosis.  He is on aspirin , Plavix , statin daily.  He denies tobacco use.  Past Medical History:  Diagnosis Date   Adrenal insufficiency (HCC)    Allergy    Anemia    Arthritis    feet    Broken foot 09/2019   had to wore a boot. Left    CKD (chronic kidney disease), stage III (HCC)    Coronary artery disease    has stents   Coronary atherosclerosis of native coronary artery    Proximal LAD, posterior lateral stent widely patent-10/12/11   Diabetes mellitus    insulin  and pills   Hearing loss    wears hearing aids   History of blood transfusion 06/30/2016   [name] - 2 units transfused   Hyperlipidemia    Hypertension    OSA (obstructive sleep apnea)    uses VPAC sleep study 2 years done through [facility]. Dr. Jeffrie arranged study   PVD (peripheral vascular disease) (HCC)    Sarcoidosis    Sleep apnea    uses CPAP nightly   Thyroid  disease    Type 2 diabetes mellitus [facility] 2)     Past Surgical History:  Procedure Laterality Date   APPENDECTOMY     CATARACT EXTRACTION  2011   bilat   CHOLECYSTECTOMY  01/25/2011   Procedure: LAPAROSCOPIC CHOLECYSTECTOMY WITH INTRAOPERATIVE  CHOLANGIOGRAM;  Surgeon: Redell Alm Faith, DO;  Location: [facility] OR;  Service: General;  Laterality: N/A;   COLONOSCOPY     several   COLONOSCOPY WITH PROPOFOL  N/A 01/03/2020   Procedure: COLONOSCOPY WITH PROPOFOL ;  Surgeon: Avram Lupita BRAVO, MD;  Location: [facility] ENDOSCOPY;  Service: Endoscopy;  Laterality: N/A;   CORONARY ANGIOPLASTY     most recent 11/2009   CORONARY PRESSURE/FFR STUDY N/A 01/18/2021   Procedure: INTRAVASCULAR PRESSURE WIRE/FFR STUDY;  Surgeon: Wonda Sharper, MD;  Location: [facility] INVASIVE CV LAB;  Service: Cardiovascular;  Laterality: N/A;   CORONARY STENT INTERVENTION N/A 08/07/2018   Procedure: CORONARY STENT INTERVENTION;  Surgeon: Dann Candyce RAMAN, MD;  Location: [facility] INVASIVE CV LAB;  Service: Cardiovascular;  Laterality: N/A;   CORONARY STENT INTERVENTION N/A 01/18/2021   Procedure: CORONARY STENT INTERVENTION;  Surgeon: Wonda Sharper, MD;  Location: [facility] INVASIVE CV LAB;  Service: Cardiovascular;  Laterality: N/A;   CORONARY STENT PLACEMENT  2009   in LAD and side branch PTCA   ESOPHAGOGASTRODUODENOSCOPY (EGD) WITH PROPOFOL  N/A 01/03/2020   Procedure: ESOPHAGOGASTRODUODENOSCOPY (EGD) WITH PROPOFOL ;  Surgeon: Avram Lupita BRAVO, MD;  Location: [facility] ENDOSCOPY;  Service: Endoscopy;  Laterality: N/A;   HEMOSTASIS CLIP PLACEMENT  01/03/2020   Procedure: HEMOSTASIS CLIP PLACEMENT;  Surgeon: Avram Lupita BRAVO, MD;  Location: [facility] ENDOSCOPY;  Service: Endoscopy;;   INTERCOSTAL NERVE BLOCK  2011, 06/2016   x2. lumbar spine   LEFT HEART CATHETERIZATION WITH CORONARY ANGIOGRAM Bilateral 10/12/2011   Procedure: LEFT HEART CATHETERIZATION WITH CORONARY ANGIOGRAM;  Surgeon: Oneil Parchment, MD;  Location: [facility] CATH LAB;  Service: Cardiovascular;  Laterality: Bilateral;   LEFT HEART CATHETERIZATION WITH CORONARY ANGIOGRAM N/A 04/01/2014   Procedure: LEFT HEART CATHETERIZATION WITH CORONARY ANGIOGRAM;  Surgeon: Oneil Parchment, MD;  Location: [facility] CATH LAB;  Service: Cardiovascular;  Laterality: N/A;   POLYPECTOMY   01/03/2020   Procedure: POLYPECTOMY;  Surgeon: Avram Lupita BRAVO, MD;  Location: [facility] ENDOSCOPY;  Service: Endoscopy;;   RIGHT/LEFT HEART CATH AND CORONARY ANGIOGRAPHY N/A 08/07/2018   Procedure: RIGHT/LEFT HEART CATH AND CORONARY ANGIOGRAPHY;  Surgeon: Dann Candyce RAMAN, MD;  Location[facility] INVAS[facility] CV LAB;  Service: Cardiovascular;  Laterality: N/A;   RIGHT/LEFT HEART CATH AND CORONARY ANGIOGRAPHY N/A 12/25/2020   Procedure: RIGHT/LEFT HEART CATH AND CORONARY ANGIOGRAPHY;  Surgeon: Claudene Victory ORN, MD;  Location: [facility] INVASIVE CV LAB;  Service: Cardiovascular;  Laterality: N/A;   TRANSFORAMINAL LUMBAR INTERBODY FUSION W/ MIS 1 LEVEL N/A 09/24/2021   Procedure: LUMBAR FOUR-FIVE MINIMALLY INVASIVE (MIS) TRANSFORAMINAL LUMBAR INTERBODY FUSION (TLIF) WITH METRX;  Surgeon: Cheryle Debby LABOR, MD;  Location: [facility] OR;  Service: Neurosurgery;  Laterality: N/A;    Allergies  Allergen Reactions   Statins Other (See Comments)    Aggression  Pt ok to take crestor  20mg  once a week   Hydrocortisone  Nausea Only   Ranexa [Ranolazine] Other (See Comments)    Severe weakness/near syncope after 1 dose Hallucinations    Doxycycline  Diarrhea   Hydrocodone  Nausea And Vomiting   Miralax  [Polyethylene Glycol] Nausea And Vomiting   Z-Pak [Azithromycin ] Other (See Comments)    Raises blood sugar   Zaroxolyn  [Metolazone ] Other (See Comments)    Drained, no energy    Prior to Admission medications   Medication Sig Start Date End Date Taking? Authorizing Provider  acetaminophen  (TYLENOL ) 500 MG tablet Take 1,000 mg by mouth daily as needed for mild pain (pain).    [provider]  albuterol  (PROVENTIL ) (2.5 MG/3ML) 0.083% nebulizer solution Take 3 mLs (2.5 mg total) by nebulization every 6 (six) hours as needed for wheezing or shortness of breath. Dx Code D86.0 07/24/14   Brien Belvie BRAVO, MD  aspirin  EC 81 MG tablet Take 81 mg by mouth every evening.     [provider]  clopidogrel  (PLAVIX ) 75 MG  tablet Take 1 tablet (75 mg total) by mouth daily. Restart on 09/28/21 09/25/21   Maree Bracken D, DO  ezetimibe  (ZETIA ) 10 MG tablet Take 10 mg by mouth in the morning. 06/25/21   [provider]  gabapentin  (NEURONTIN ) 100 MG capsule Take 100 mg by mouth daily. 08/04/23   [provider]  levothyroxine  (SYNTHROID ) 75 MCG tablet Take 75 mcg by mouth in the morning. 02/24/21   [provider]  metoprolol  tartrate (LOPRESSOR ) 25 MG tablet TAKE 0.5 TABLETS BY MOUTH 2 TIMES DAILY. 02/13/23   Parchment Oneil BROCKS, MD  montelukast  (SINGULAIR ) 10 MG tablet Take 10 mg by mouth daily as needed (allergies). 07/18/18   [provider]  NOVOLIN 70/30 (70-30) 100 UNIT/ML injection Inject into the skin. 08/23/23   [provider]  pantoprazole  (PROTONIX ) 40 MG tablet Take 40 mg by mouth daily. 08/10/23   [provider]  potassium chloride  (KLOR-CON  M) 10 MEQ tablet Take 10 mEq by mouth in the morning.    [provider]  potassium chloride  (MICRO-K ) 10  MEQ CR capsule Take 10 mEq by mouth daily. 08/04/23   [provider]  predniSONE  (DELTASONE ) 10 MG tablet Take 10 mg by mouth in the morning. Continuous course.    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   Occupation: MANAGER    Employer: [employer]  Tobacco Use   Smoking status: Never   Smokeless tobacco: Never  Vaping Use   Vaping status: Never Used  Substance and Sexual Activity   Alcohol use: No   Drug use: No   Sexual activity: Not on file  Other Topics Concern   Not on file  Social History Narrative   Not on file   Social Drivers of  Health   Financial Resource Strain: Low Risk  (02/08/2020)   Received from [facility]   Overall Financial Resource Strain (CARDIA)    Difficulty of Paying Living Expenses: Not hard at all  Food Insecurity: No Food Insecurity (10/10/2023)   Hunger Vital Sign    Worried About Running Out of Food in the Last Year: Never true    Ran Out of Food in the Last Year: Never true  Transportation Needs: No Transportation Needs (10/10/2023)   PRAPARE - Administrator, Civil Service (Medical): No    Lack of Transportation (Non-Medical): No  Physical Activity: Not on file  Stress: No Stress Concern Present (08/10/2021)   Received from [facility] of Occupational Health - Occupational Stress Questionnaire    Feeling of Stress : Not at all  Social Connections: Socially Integrated (10/10/2023)   Social Connection and Isolation Panel    Frequency of Communication with Friends and Family: More than three times a week    Frequency of Social Gatherings with Friends and Family: More than three times a week    Attends Religious Services: More than 4 times per year    Active Member of [employer] or Organizations: Yes    Attends Engineer, structural: More than 4 times per year    Marital Status: Married  Catering manager Violence: Not At Risk (10/10/2023)   Humiliation, Afraid, Rape, and Kick questionnaire    Fear of Current or Ex-Partner: No    Emotionally Abused: No    Physically Abused: No    Sexually Abused: No    Family History  Problem Relation Age of Onset   Kidney disease Mother    Diabetes Mother    Kidney cancer Mother    Heart attack Father    Asthma Sister    Anesthesia problems Sister        Kidney's did not wake up   Sarcoidosis Sister    Sarcoidosis Niece    Colon cancer Neg Hx    Colon polyps Neg Hx    Rectal cancer Neg Hx    Stomach cancer Neg Hx     ROS: Otherwise negative unless mentioned in HPI  Physical Examination  Vitals:   10/10/23  1211 10/10/23 1529  BP: (!) 115/57 121/61  Pulse: 74 71  Resp: 19 17  Temp: 98 F (36.7 C)  SpO2: 94% 100%   Body mass index is 28.71 kg/m.  General:  WDWN in NAD Gait: Not observed HENT: WNL, normocephalic Pulmonary: normal non-labored breathing Cardiac: regular Abdomen:  soft, NT/ND, no masses Skin: without rashes Vascular Exam/Pulses: Palpable femoral pulses; absent pedal pulses Extremities: Left plantar foot ulceration with cellulitis tracking up to the knee Musculoskeletal: no muscle wasting or atrophy  Neurologic: A&O X 3;  No focal weakness or paresthesias are detected; speech is fluent/normal Psychiatric:  The pt has Normal affect. Lymph:  Unremarkable  CBC    Component Value Date/Time   WBC 11.2 (H) 10/10/2023 0903   RBC 3.85 (L) 10/10/2023 0903   HGB 12.7 (L) 10/10/2023 0903   HGB 11.6 (L) 12/28/2021 1252   HCT 37.4 (L) 10/10/2023 0903   HCT 37.9 12/28/2021 1252   PLT 201 10/10/2023 0903   PLT 327 12/28/2021 1252   MCV 97.1 10/10/2023 0903   MCV 90 12/28/2021 1252   MCH 33.0 10/10/2023 0903   MCHC 34.0 10/10/2023 0903   RDW 13.2 10/10/2023 0903   RDW 15.7 (H) 12/28/2021 1252   LYMPHSABS 1.0 10/09/2023 1852   MONOABS 1.2 (H) 10/09/2023 1852   EOSABS 0.3 10/09/2023 1852   BASOSABS 0.1 10/09/2023 1852    BMET    Component Value Date/Time   NA 131 (L) 10/10/2023 1438   NA 142 12/28/2021 1252   K 3.8 10/10/2023 1438   CL 98 10/10/2023 1438   CO2 25 10/10/2023 1438   GLUCOSE 378 (H) 10/10/2023 1438   BUN 16 10/10/2023 1438   BUN 19 12/28/2021 1252   CREATININE 1.19 10/10/2023 1438   CREATININE 2.17 (H) 01/09/2020 0940   CALCIUM  8.3 (L) 10/10/2023 1438   GFRNONAA >60 10/10/2023 1438   GFRNONAA 29 (L) 01/09/2020 0940   GFRAA 34 (L) 01/09/2020 0940    COAGS: Lab Results  Component Value Date   INR 1.3 (H) 03/16/2022   INR 1.1 03/15/2022   INR 1.1 10/01/2021     Non-Invasive Vascular Imaging:   ABI/TBIToday's ABIToday's TBIPrevious  ABIPrevious TBI  +-------+-----------+-----------+------------+------------+  Right [place]         0.53       [place]          0.46          +-------+-----------+-----------+------------+------------+  Left  [place]         0.31       [place]          0.50          +-------+-----------+-----------+------------+------------+     ASSESSMENT/PLAN: This is a 78 y.o. male with diabetic foot ulcer and PAD  Mr. Strite is a 78 year old man who developed a left foot ulcer after stepping on a rock 2 weeks ago.  He now has ascending cellulitis up to his knee.  He has been started on IV antibiotics however workup also includes ABI/TBI which demonstrates noncompressible tibial vessels and a toe pressure of 40 mmHg.  On exam he does not have any pedal pulses.  Given the presence of the ulcer as well as his arterial insufficiency he is at risk for limb loss.  We discussed aortogram with left lower extremity runoff and possible intervention.  He would like to discuss this with his wife prior to agreeing.  He was scheduled for debridement with podiatry however would like to return to his podiatrist in [place] to have surgery.  He will continue his aspirin  and Plavix  daily.  On-call vascular surgeon Dr. Sheree will  evaluate the patient later today and provide further treatment plans.   Donnice Sender PA-C [facility] [phone]  I have independently interviewed and examined patient and agree with PA assessment and plan above.  Patient would likely benefit from angiography to evaluate left lower extremity given depressed toe pressure with wound.  At this time he is unsure that he wants to proceed with angiography and ultimately wants to have his podiatrist in [place] evaluate his foot.  I did discuss that he is high risk for more proximal amputation and he demonstrates good understanding.  Damyan Corne C. Sheree, MD [facility] [place] Office: [phone] Pager:  [phone]

## 2023-10-10 NOTE — Inpatient Diabetes Management (Signed)
 Inpatient Diabetes Program Recommendations  AACE/ADA: New Consensus Statement on Inpatient Glycemic Control (2015)  Target Ranges:  Prepandial:   less than 140 mg/dL      Peak postprandial:   less than 180 mg/dL (1-2 hours)      Critically ill patients:  140 - 180 mg/dL   Lab Results  Component Value Date   GLUCAP 198 (H) 10/10/2023   HGBA1C 9.4 (H) 03/17/2022    Latest Reference Range & Units 10/09/23 21:18 10/10/23 00:12 10/10/23 04:08 10/10/23 09:50  Glucose-Capillary 70 - 99 mg/dL 612 (H) 739 (H) 828 (H) 198 (H)  (H): Data is abnormally high  Diabetes history: DM  Outpatient Diabetes medications:  70/30 40 units bid with meals  Current orders for Inpatient glycemic control: NPH 20 units am & hs, Novolog  0-9 units q 4 hrs., Prednisone  10 mg daily  Inpatient Diabetes Program Recommendations:    Patient admitted with cellulitis. Will follow during hospitalization and assist as needed. May need Novolog  meal coverage added.  Thank you, Daelen Belvedere E. Kimaria Struthers, RN, MSN, CDCES  Diabetes Coordinator Inpatient Glycemic Control Team Team Pager [phone] (8am-5pm) 10/10/2023 11:01 AM

## 2023-10-10 NOTE — Progress Notes (Signed)
 ABI has been completed.   Results can be found under chart review under CV PROC. 10/10/2023 4:36 PM Loistine Eberlin RVT, RDMS

## 2023-10-10 NOTE — Progress Notes (Signed)
 TRH ROUNDING NOTE DEMONT LINFORD FMW:[id number]  DOB: 1945/06/07  DOA: 10/09/2023  PCP: Arloa Elsie SAUNDERS, MD  10/10/2023,5:02 AM  LOS: 1 day    Code Status: Full code     from: Home current Dispo: Likely home    78 year old white male Sarcoid on chronic prednisone  he used to see Dr. Brien pulmonary in the past-on last admission 524 it was felt that he would be better off being on methotrexate versus steroids according to pulmonology OSA supposed to be on CPAP-obesity BMI 28 HTN HLD Hypothyroidism-- DM  2 CAD with multiple stents DES to RPDA 2020+ HFpEF-last cath PCI successful two-vessel RCA last EF 08/02/2022 was 55-60%  Has had multiple admissions for cellulitis in the past as well as multiple admissions for pneumonia Has a history of Charcot foot followed by [facility]  Return to [facility] foot swelling discomfort-previously has had cellulitis in the same leg after maybe stepping on something  Plan  Lower extremity cellulitis recurrent Precipitant?  Immunosuppression with prednisone  for his sarcoid-continues on Unasyn  3 g every 6 vancomycin  1.2G every 24 Pain control Tylenol , 1000 Q 8-for severe pain Fentanyl  12.5--adding Oxy IR 5-10 every 6 as needed Add back gabapentin  100 daily  podiatry input pending--- aspirin  Plavix  not discontinued--- defer timing of the same to podiatry    From my perspective, based onm exam might require I&D of the area as well as deep cultures  Pulmonary sarcoidosis Patient cannot apparently come off of steroids-every time he does he is unable to function/walk/do ADLs I do not think he is a candidate for weaning off of steroids--not emergently needs outpatient follow-up with either pulmonary or rheumatoid with regards to [facility] RD's for this indication CC pulmonary  BMI 28 class I obesity, OSA supposed to be on CPAP Will inquire-trial auto titration overnight  CAD with multiple stents DES 2020 PCI 2024  metoprolol  12.5 twice daily Zetia  10-resume Crestor  20 Holding  losartan  as below  Severe hypokalemia, AKI on admission, probable euvolemic hyponatremia additionally Labs still pending from this morning unclear why Potassium replaced with K-Dur overnight Holding losartan , Lasix  80 NO LABS YET 9:46 AM  DM TY 2 -Home meds 70/30 [id number] units 3 times daily-unusual dosing Here currently on sliding scale every 4 hourly and 20 units NPH--- CBGs are ranging 170-260 today  Data Reviewed:   No labs performed?  DVT prophylaxis: SCD  Status is: Inpatient Remains inpatient appropriate because:   Needs inpatient management     Subjective: Looks well feels fair pain is improved in lower extremity States swelling of the leg is gone down Passing good urine-    Objective + exam Vitals:   10/09/23 2229 10/09/23 2322 10/09/23 2343 10/10/23 0406  BP: 134/70 118/64  (!) 143/74  Pulse: 80 74  81  Resp: 17 16  15   Temp: 97.6 F (36.4 C) 98.3 F (36.8 C)  98.2 F (36.8 C)  TempSrc: Oral Oral  Oral  SpO2: 96% 96% 96% 99%  Weight:  90.8 kg    Height:  5' 10 (1.778 m)     Filed Weights   10/09/23 1239 10/09/23 2322  Weight: 93 kg 90.8 kg     Examination: Awake coherent alert no distress looks about stated age Mallampati 4 Neck soft supple no thyromegaly S1-S2 no murmur On monitor sinus rhythm Abdomen soft Foot exam as above in progress note with erythema of lower leg   Scheduled Meds:  clopidogrel   75 mg Oral Daily   ezetimibe   10  mg Oral Daily   gabapentin   100 mg Oral Daily   insulin  aspart  0-9 Units Subcutaneous Q4H   insulin  NPH Human  20 Units Subcutaneous BID AC & HS   levothyroxine   75 mcg Oral Q0600   metoprolol  tartrate  12.5 mg Oral BID   pantoprazole   40 mg Oral Daily   predniSONE   10 mg Oral Q breakfast   rosuvastatin   20 mg Oral QHS   Continuous Infusions:  sodium chloride  100 mL/hr at 10/10/23 0047   ampicillin -sulbactam (UNASYN ) IV 3 g (10/10/23 0429)   potassium PHOSPHATE  IVPB (in mmol)     vancomycin        Time 50 minutes  Jai-Gurmukh Melyna Huron, MD  [facility]

## 2023-10-10 NOTE — Evaluation (Signed)
 Physical Therapy Evaluation Patient Details Name: [name] MRN: [id number] DOB: Aug 17, 1945 Today's Date: 10/10/2023  History of Present Illness  Pt is a 78 y.o. male admitted with LLE cellulitis. PMH significant for HTN, HLD, CAD with multiple stents, diastolic CHF, GERD, anemia, OSA not on CPAP, PAD, pneumonia, & hypothyroidism.  Clinical Impression  Pt admitted with above diagnosis. Pt was able to ambulate with CGA with RW with cues for safety as pt impulsive at times. Pt should progress well and has support at home.  Pt to f/u with MD regarding left CAM boot and determine if this is the best boot for pt given his infection.  This PT also messaged his MD that is following him here in hospital. Will follow acutely and pt is likely close to baseline and can go home with Outpt PT f/u prn.  Has all equipment.  Pt currently with functional limitations due to the deficits listed below (see PT Problem List). Pt will benefit from acute skilled PT to increase their independence and safety with mobility to allow discharge.           If plan is discharge home, recommend the following: A little help with walking and/or transfers;A little help with bathing/dressing/bathroom;Assistance with cooking/housework;Assist for transportation;Help with stairs or ramp for entrance   Can travel by private vehicle        Equipment Recommendations None recommended by PT  Recommendations for Other Services       Functional Status Assessment Patient has had a recent decline in their functional status and demonstrates the ability to make significant improvements in function in a reasonable and predictable amount of time.     Precautions / Restrictions Precautions Precautions: Fall Required Braces or Orthoses: Other Brace Other Brace: pt has CAM boot for LLE Restrictions Weight Bearing Restrictions Per Provider Order: No      Mobility  Bed Mobility Overal bed mobility: Modified Independent              General bed mobility comments: pt seated EOB upon arrival, pt donned shoe and boot on his own    Transfers Overall transfer level: Needs assistance Equipment used: Rolling walker (2 wheels) Transfers: Sit to/from Stand Sit to Stand: Contact guard assist           General transfer comment: CGA for safety, no physical assist. Cues for hand placement.    Ambulation/Gait Ambulation/Gait assistance: Contact guard assist Gait Distance (Feet): 175 Feet Assistive device: Rolling walker (2 wheels) Gait Pattern/deviations: Step-through pattern, Decreased stride length, Decreased step length - right, Decreased step length - left, Decreased weight shift to left, Decreased stance time - left, Trunk flexed, Drifts right/left   Gait velocity interpretation: 1.31 - 2.62 ft/sec, indicative of limited community ambulator   General Gait Details: Pt was able to ambulate with RW with CGA and cues for safety as pt at times needed cues to stay close to RW and slow down as he tends to walk at hurried pace. Pt also occasionally narrows BOS and with boot on needs cues for safety. No LOB during gait just need for cues at times.  Stairs            Wheelchair Mobility     Tilt Bed    Modified Rankin (Stroke Patients Only)       Balance Overall balance assessment: Needs assistance Sitting-balance support: No upper extremity supported, Feet supported Sitting balance-Leahy Scale: Good     Standing balance support: Bilateral upper extremity supported,  During functional activity, Reliant on assistive device for balance Standing balance-Leahy Scale: Poor Standing balance comment: reelies on device and external support and cues                             Pertinent Vitals/Pain Pain Assessment Pain Assessment: Faces Pain Score: 8  Faces Pain Scale: Hurts whole lot Pain Location: bottom of left foot Pain Descriptors / Indicators: Discomfort, Dull, Grimacing Pain Intervention(s):  Limited activity within patient's tolerance, Monitored during session, Repositioned    Home Living Family/patient expects to be discharged to:: Private residence Living Arrangements: Spouse/significant other Available Help at Discharge: Family;Available 24 hours/day Type of Home: House Home Access: Ramped entrance       Home Layout: One level Home Equipment: Agricultural consultant (2 wheels);Rollator (4 wheels);Wheelchair - manual;Electric scooter;BSC/3in1;Grab bars - tub/shower;Grab bars - toilet;Hand held shower head;Shower seat;Shower seat - built in;Hospital bed;Wheelchair - power;Adaptive equipment;Cane - single point      Prior Function Prior Level of Function : Independent/Modified Independent             Mobility Comments: no AD inside home, uses rollator/scooter for community mobility, RW occassionally ADLs Comments: independent     Extremity/Trunk Assessment   Upper Extremity Assessment Upper Extremity Assessment: Defer to OT evaluation    Lower Extremity Assessment Lower Extremity Assessment: LLE deficits/detail LLE: Unable to fully assess due to pain    Cervical / Trunk Assessment Cervical / Trunk Assessment: Kyphotic  Communication   Communication Communication: No apparent difficulties    Cognition Arousal: Alert Behavior During Therapy: WFL for tasks assessed/performed                             Following commands: Intact       Cueing Cueing Techniques: Verbal cues     General Comments General comments (skin integrity, edema, etc.): VSS with O2 staying > 94% on RA.    Exercises     Assessment/Plan    PT Assessment Patient needs continued PT services  PT Problem List Decreased activity tolerance;Decreased balance;Decreased mobility;Decreased knowledge of use of DME;Decreased safety awareness;Decreased knowledge of precautions;Cardiopulmonary status limiting activity       PT Treatment Interventions DME instruction;Gait  training;Functional mobility training;Therapeutic activities;Therapeutic exercise;Balance training;Patient/family education    PT Goals (Current goals can be found in the Care Plan section)  Acute Rehab PT Goals Patient Stated Goal: to go home PT Goal Formulation: With patient Time For Goal Achievement: 10/24/23 Potential to Achieve Goals: Good    Frequency Min 2X/week     Co-evaluation               AM-PAC PT 6 Clicks Mobility  Outcome Measure Help needed turning from your back to your side while in a flat bed without using bedrails?: None Help needed moving from lying on your back to sitting on the side of a flat bed without using bedrails?: None Help needed moving to and from a bed to a chair (including a wheelchair)?: A Little Help needed standing up from a chair using your arms (e.g., wheelchair or bedside chair)?: A Little Help needed to walk in hospital room?: A Little Help needed climbing 3-5 steps with a railing? : Total 6 Click Score: 18    End of Session Equipment Utilized During Treatment: Gait belt Activity Tolerance: Patient tolerated treatment well Patient left: with call bell/phone within reach;with family/visitor present (sitting  on EOB) Nurse Communication: Mobility status PT Visit Diagnosis: Muscle weakness (generalized) (M62.81)    Time: 1007-1030 PT Time Calculation (min) (ACUTE ONLY): 23 min   Charges:   PT Evaluation $PT Eval Moderate Complexity: 1 Mod PT Treatments $Gait Training: 8-22 mins PT General Charges $$ ACUTE PT VISIT: 1 Visit         Zyhir Cappella M,PT Acute Rehab Services [phone]   Stephane JULIANNA Bevel 10/10/2023, 12:07 PM

## 2023-10-10 NOTE — Progress Notes (Signed)
 Pharmacy Antibiotic Note  [name] is a 78 y.o. male admitted on 10/09/2023 with wound infection. Pharmacy has been consulted for vancomycin  and Unasyn  dosing.  Scr now significantly better than at the time of vancomycin  initiation.  Plan: -Increase vancomycin  to 1750 mg IV q 24 hrs. -Unasyn  3 g IV q6h  Height: 5' 10 (177.8 cm) Weight: 90.8 kg (200 lb 1.6 oz) IBW/kg (Calculated) : 73  Temp (24hrs), Avg:98.1 F (36.7 C), Min:97.6 F (36.4 C), Max:98.5 F (36.9 C)  Recent Labs  Lab 10/09/23 1314 10/09/23 1852 10/09/23 2025 10/10/23 0903  WBC  --  13.3*  --  11.2*  CREATININE 1.58*  --  1.36* 1.06    Estimated Creatinine Clearance: 66.1 mL/min (by C-G formula based on SCr of 1.06 mg/dL).    Allergies  Allergen Reactions   Statins Other (See Comments)    Aggression  Pt ok to take crestor  20mg  once a week   Hydrocortisone  Nausea Only   Ranexa [Ranolazine] Other (See Comments)    Severe weakness/near syncope after 1 dose Hallucinations    Doxycycline  Diarrhea   Hydrocodone  Nausea And Vomiting   Miralax  [Polyethylene Glycol] Nausea And Vomiting   Z-Pak [Azithromycin ] Other (See Comments)    Raises blood sugar   Zaroxolyn  [Metolazone ] Other (See Comments)    Drained, no energy    Antimicrobials this admission: Unasyn  8/4 >> Vancomycin  8/4 >>  Dose adjustments this admission: NA  Microbiology results: 8/4 BCx: pending 8/4 MRSA PCR: ordered  Thank you for allowing pharmacy to be a part of this patient's care. Harlene Barlow, [name], BCCP Clinical Pharmacist  10/10/2023 2:04 PM   [facility] pharmacy phone numbers are listed on [email or web address]

## 2023-10-10 NOTE — Consult Note (Signed)
 WOC Nurse Consult Note: patient has been seen by podiatry at [facility] 10/03/2023 for this wound, they prescribed Iodosorb which is not on formulary at [facility]  Reason for Consult: lower extremity wound  Wound type: full thickness L plantar foot likely diabetic ulcer, patient with known charcot foot deformity  Pressure Injury POA: NA  Measurement: see nursing flowsheet  Wound azi:jeezjmd clean  Drainage (amount, consistency, odor) see nursing flowsheet; ED physician reports purulent drainage  Periwound:erythema  Dressing procedure/placement/frequency: Cleanse L plantar foot wound with Betadine, using a Q tip applicator insert a small piece of Betadine soaked gauze to wound bed daily, cover with dry gauze and secure with silicone foam or Kerlix roll gauze whichever is preferred.   Per podiatry note patient has a CAM walker and if has with him should utilize this with walking.    Podiatry has been consulted on this patient and they plan to see him 10/10/2023.  Any wound care orders placed by podiatry supercede wound care orders placed by [facility] RN.    Thank you,    Powell Bar MSN, RN-BC, [employer]

## 2023-10-10 NOTE — Consult Note (Signed)
 PODIATRY CONSULTATION  NAME [name] MRN [id number] DOB 1945/10/14 DOA 10/09/2023   Reason for consult: Left plantar foot ulceration  Attending/Consulting physician: DOROTHA Grimes MD  History of present illness: [name] is a 78 y.o. male with medical history significant of DM2 , adrenal insufficiency, CKD stage III, CAD status post stents, HLD HTN OSA PVD sarcoidosis OSA hypothyroidism, Charcot right foot secondary to diabetes  Presented with increased pain and swelling redness left foot 2 wk hx of left foot wound Blister after he stepped on something, He saw podiatrist was started on a walking boot and a cream  2 days ago his whole foot started to look red now up to the knee  There is purulent discharge  Plain imaging could not rule out osteomyelitis Has been having subjective fevers and drainage Given a  dose of vancomycin     Patient has known history of Charcot  foot followed by [facility] Reports had n?v all night Had not had any of his meds for the past 4 days  Did not take hsi insulin  for the past 4 days   I discussed with patient regarding the history of his left foot wound.  He has a longstanding history of Charcot deformity of the left foot.  He has recently seen Dr. Gunnar with [facility] podiatry.  This was in [place] last week.  He was prescribed Iodosorb ointment and told to follow-up in 3 to 4 weeks.  He reports no imaging was taken at that visit.  He had worsening redness swelling in the left lower extremity and therefore presented to the emergency department at [facility].  Podiatry was consulted for further evaluation.  Patient has seen Dr. Harden in the past and was recommended for a proximal limb amputation and he refused this and preferred to follow-up with [facility].  There has been discussion in the past about Charcot reconstruction the left foot however decision was made to not pursue this and instead proceed with offloading with custom orthotics which the patient does  have.  Past Medical History:  Diagnosis Date   Adrenal insufficiency (HCC)    Allergy    Anemia    Arthritis    feet    Broken foot 09/2019   had to wore a boot. Left    CKD (chronic kidney disease), stage III (HCC)    Coronary artery disease    has stents   Coronary atherosclerosis of native coronary artery    Proximal LAD, posterior lateral stent widely patent-10/12/11   Diabetes mellitus    insulin  and pills   Hearing loss    wears hearing aids   History of blood transfusion 06/30/2016   [name] - 2 units transfused   Hyperlipidemia    Hypertension    OSA (obstructive sleep apnea)    uses VPAC sleep study 2 years done through [place] [facility]. Dr. Jeffrie arranged study   PVD (peripheral vascular disease) (HCC)    Sarcoidosis    Sleep apnea    uses CPAP nightly   Thyroid  disease    Type 2 diabetes mellitus (HCC)        Latest Ref Rng & Units 10/10/2023    9:03 AM 10/09/2023    6:52 PM 08/05/2022    6:08 AM  CBC  WBC 4.0 - 10.5 K/uL 11.2  13.3  13.5   Hemoglobin 13.0 - 17.0 g/dL 87.2  87.0  87.2   Hematocrit 39.0 - 52.0 % 37.4  39.7  39.1   Platelets  150 - 400 K/uL 201  192  221        Latest Ref Rng & Units 10/09/2023    8:25 PM 10/09/2023    1:14 PM 08/05/2022    6:08 AM  BMP  Glucose 70 - 99 mg/dL 604  525  92   BUN 8 - 23 mg/dL 24  24  16    Creatinine 0.61 - 1.24 mg/dL 8.63  8.41  8.59   Sodium 135 - 145 mmol/L 132  127  138   Potassium 3.5 - 5.1 mmol/L 2.8  3.4  3.4   Chloride 98 - 111 mmol/L 95  90  103   CO2 22 - 32 mmol/L 26  22  26    Calcium  8.9 - 10.3 mg/dL 8.2  8.1  8.0       Physical Exam: Lower Extremity Exam  Attention directed the left plantar midfoot/rear foot area underlying the cuboid there is an ulceration with surrounding macerated as well as erythema.  There is drainage from this ulceration that appears to be purulent.  Significant rocker-bottom deformity of the left foot with the ulceration located at the plantar pressure-point.  Nonpalpable  DP and PT pulses though foot does feel warm and CFT less than 3 seconds to digits   Sensation is significantly diminished to light touch in the forefoot         ASSESSMENT/PLAN OF CARE 78 y.o. male with PMHx significant for   DM2 , adrenal insufficiency, CKD stage III, CAD status post stents, HLD HTN OSA PVD sarcoidosis OSA hypothyroidism, Charcot right foot secondary to diabetes  with infected ulceration to the left plantar rear foot secondary to neuropathic pressure ulceration in the setting of rocker-bottom deformity with Charcot.  High concern for underlying osteomyelitis of the cuboid.  WBC 11.2 from 13.3 Hemoglobin A1c pending CRP 20.1 ESR 63  MRI left foot without contrast: Soft tissue ulceration plantar lateral midfoot cellulitis possible underlying phlegmonous change without drainable abscess.  Marrow edema concerning for osteomyelitis in the cuboid with cortical irregularity, Charcot arthropathy with midfoot collapse and rocker-bottom deformity  - Had a discussion with the patient after MRI result came in regarding concern for OM at the plantar cuboid and that I feel this result makes sense given location of wound and cortical changes on MRI. Discussed options in icluding surgical intervention which I do recommend and am happy to provide for him tomorrow. This is not urgent/emergent but will need debridement of the wound and bone biopsy / planing of the plantar bone underlying the wound at site of concern for OM. Would likely also proceed with antibiotic beads and wound vac application. He would need long term NWB following surgery for any chance of the wound to heel. I also discussed limb amputation BKA as a more definitive option for infection control and concern for rocker bottom deformity of the left foot - I am no recommending this however it is an option for him that he may consider. He is at risk for limb loss with any salvage attempt. I would plan to proceed with surgery  tomorrow and discussed my plan with patient in detail. He wishes to discuss further with his wife this afternoon and decide how he wants to proceed.  -  I also discussed his case with his outpatient podiatrist Dr. Gunnar who he saw last week at [facility] as a courtesy, who stated his practice on call surgeon would be available to consult should the patient want to go to [facility]  hospital.  - Continue IV abx broad spectrum pending further culture data - Anticoagulation: Okay to continue at this time   - Wound care: Recommend Betadine gauze dressing to the left foot change daily - WB status: Nonweightbearing to left foot - Will continue to follow   Thank you for the consult.  Please contact me directly with any questions or concerns.           [name], DPM [facility]    [address] [place], [place] [address]                Office [phone]  Fax [phone]

## 2023-10-10 NOTE — Progress Notes (Incomplete)
 Initial Nutrition Assessment  DOCUMENTATION CODES:      INTERVENTION:  ***  NUTRITION DIAGNOSIS:     related to   as evidenced by  .  GOAL:      MONITOR:      REASON FOR ASSESSMENT:   Consult Assessment of nutrition requirement/status  ASSESSMENT:   Pt admitted from home with c/o increased pain and swelling to left foot. PMH significant for DM2, adrenal insufficiency, CKD stage III, CAD s/p stents, HLD, HTN, PVD, hypothyroidism, charcot right foot secondary to diabetes.  On chronic prednisone .   Notably pt with multiple admission for cellulitis in thepast as well as PNA.   NUTRITION - FOCUSED PHYSICAL EXAM:  {RD Focused Exam List:21252}  Diet Order:   Diet Order             Diet Carb Modified Fluid consistency: Thin; Room service appropriate? Yes with Assist  Diet effective now                   EDUCATION NEEDS:      Skin:     Last BM:     Height:   Ht Readings from Last 1 Encounters:  10/09/23 5' 10 (1.778 m)    Weight:   Wt Readings from Last 1 Encounters:  10/09/23 90.8 kg    Ideal Body Weight:     BMI:  Body mass index is 28.71 kg/m.  Estimated Nutritional Needs:   Kcal:     Protein:     Fluid:       ***

## 2023-10-11 DIAGNOSIS — L97424 Non-pressure chronic ulcer of left heel and midfoot with necrosis of bone: Secondary | ICD-10-CM | POA: Diagnosis not present

## 2023-10-11 DIAGNOSIS — I10 Essential (primary) hypertension: Secondary | ICD-10-CM | POA: Diagnosis not present

## 2023-10-11 DIAGNOSIS — N179 Acute kidney failure, unspecified: Secondary | ICD-10-CM | POA: Diagnosis not present

## 2023-10-11 DIAGNOSIS — E7800 Pure hypercholesterolemia, unspecified: Secondary | ICD-10-CM | POA: Diagnosis not present

## 2023-10-11 DIAGNOSIS — L0889 Other specified local infections of the skin and subcutaneous tissue: Secondary | ICD-10-CM

## 2023-10-11 LAB — HEMOGLOBIN A1C
Hgb A1c MFr Bld: 10.6 % — ABNORMAL HIGH (ref 4.8–5.6)
Mean Plasma Glucose: 258 mg/dL

## 2023-10-11 LAB — CBC WITH DIFFERENTIAL/PLATELET
Abs Immature Granulocytes: 0.25 K/uL — ABNORMAL HIGH (ref 0.00–0.07)
Basophils Absolute: 0.1 K/uL (ref 0.0–0.1)
Basophils Relative: 1 %
Eosinophils Absolute: 0.2 K/uL (ref 0.0–0.5)
Eosinophils Relative: 2 %
HCT: 40.2 % (ref 39.0–52.0)
Hemoglobin: 13.4 g/dL (ref 13.0–17.0)
Immature Granulocytes: 2 %
Lymphocytes Relative: 11 %
Lymphs Abs: 1.2 K/uL (ref 0.7–4.0)
MCH: 32.8 pg (ref 26.0–34.0)
MCHC: 33.3 g/dL (ref 30.0–36.0)
MCV: 98.3 fL (ref 80.0–100.0)
Monocytes Absolute: 1 K/uL (ref 0.1–1.0)
Monocytes Relative: 9 %
Neutro Abs: 8.4 K/uL — ABNORMAL HIGH (ref 1.7–7.7)
Neutrophils Relative %: 75 %
Platelets: 238 K/uL (ref 150–400)
RBC: 4.09 MIL/uL — ABNORMAL LOW (ref 4.22–5.81)
RDW: 13.1 % (ref 11.5–15.5)
WBC: 11 K/uL — ABNORMAL HIGH (ref 4.0–10.5)
nRBC: 0 % (ref 0.0–0.2)

## 2023-10-11 LAB — BASIC METABOLIC PANEL WITH GFR
Anion gap: 9 (ref 5–15)
BUN: 17 mg/dL (ref 8–23)
CO2: 26 mmol/L (ref 22–32)
Calcium: 8.6 mg/dL — ABNORMAL LOW (ref 8.9–10.3)
Chloride: 102 mmol/L (ref 98–111)
Creatinine, Ser: 1.45 mg/dL — ABNORMAL HIGH (ref 0.61–1.24)
GFR, Estimated: 50 mL/min — ABNORMAL LOW (ref >60)
Glucose, Bld: 117 mg/dL — ABNORMAL HIGH (ref 70–99)
Potassium: 3.2 mmol/L — ABNORMAL LOW (ref 3.5–5.1)
Sodium: 137 mmol/L (ref 135–145)

## 2023-10-11 LAB — MAGNESIUM: Magnesium: 1.8 mg/dL (ref 1.7–2.4)

## 2023-10-11 LAB — GLUCOSE, CAPILLARY
Glucose-Capillary: 104 mg/dL — ABNORMAL HIGH (ref 70–99)
Glucose-Capillary: 184 mg/dL — ABNORMAL HIGH (ref 70–99)
Glucose-Capillary: 187 mg/dL — ABNORMAL HIGH (ref 70–99)
Glucose-Capillary: 367 mg/dL — ABNORMAL HIGH (ref 70–99)
Glucose-Capillary: 380 mg/dL — ABNORMAL HIGH (ref 70–99)
Glucose-Capillary: 389 mg/dL — ABNORMAL HIGH (ref 70–99)

## 2023-10-11 LAB — PHOSPHORUS: Phosphorus: 2 mg/dL — ABNORMAL LOW (ref 2.5–4.6)

## 2023-10-11 MED ORDER — ZINC SULFATE 220 (50 ZN) MG PO CAPS
220.0000 mg | ORAL_CAPSULE | Freq: Every day | ORAL | Status: DC
  Administered 2023-10-11 – 2023-10-13 (×3): 220 mg via ORAL
  Filled 2023-10-11 (×3): qty 1

## 2023-10-11 MED ORDER — ENSURE MAX PROTEIN PO LIQD
11.0000 [oz_av] | Freq: Every day | ORAL | Status: DC
  Administered 2023-10-11: 237 mL via ORAL
  Filled 2023-10-11 (×3): qty 330

## 2023-10-11 MED ORDER — HYDRALAZINE HCL 25 MG PO TABS: 25.0000 mg | ORAL_TABLET | Freq: Three times a day (TID) | ORAL | Status: DC | PRN

## 2023-10-11 MED ORDER — VITAMIN C 500 MG PO TABS
500.0000 mg | ORAL_TABLET | Freq: Two times a day (BID) | ORAL | Status: DC
  Administered 2023-10-11 – 2023-10-13 (×5): 500 mg via ORAL
  Filled 2023-10-11 (×5): qty 1

## 2023-10-11 MED ORDER — JUVEN PO PACK
1.0000 | PACK | Freq: Two times a day (BID) | ORAL | Status: DC
  Administered 2023-10-11 – 2023-10-13 (×4): 1 via ORAL
  Filled 2023-10-11 (×4): qty 1

## 2023-10-11 MED ORDER — ADULT MULTIVITAMIN W/MINERALS CH
1.0000 | ORAL_TABLET | Freq: Every day | ORAL | Status: DC
  Administered 2023-10-11 – 2023-10-13 (×3): 1 via ORAL
  Filled 2023-10-11 (×3): qty 1

## 2023-10-11 MED ORDER — POTASSIUM CHLORIDE 20 MEQ PO PACK
20.0000 meq | PACK | Freq: Once | ORAL | Status: AC
  Administered 2023-10-11: 20 meq via ORAL
  Filled 2023-10-11: qty 1

## 2023-10-11 MED ORDER — INSULIN ASPART 100 UNIT/ML IJ SOLN
0.0000 [IU] | Freq: Three times a day (TID) | INTRAMUSCULAR | Status: DC
  Administered 2023-10-11 – 2023-10-12 (×3): 9 [IU] via SUBCUTANEOUS

## 2023-10-11 MED ORDER — AMLODIPINE BESYLATE 5 MG PO TABS
5.0000 mg | ORAL_TABLET | Freq: Every day | ORAL | Status: DC
  Administered 2023-10-11 – 2023-10-13 (×3): 5 mg via ORAL
  Filled 2023-10-11 (×3): qty 1

## 2023-10-11 MED ORDER — POTASSIUM CHLORIDE CRYS ER 20 MEQ PO TBCR
60.0000 meq | EXTENDED_RELEASE_TABLET | Freq: Once | ORAL | Status: AC
  Administered 2023-10-11: 60 meq via ORAL
  Filled 2023-10-11: qty 3

## 2023-10-11 MED ORDER — POTASSIUM & SODIUM PHOSPHATES 280-160-250 MG PO PACK
1.0000 | PACK | Freq: Three times a day (TID) | ORAL | Status: DC
  Administered 2023-10-11 – 2023-10-13 (×9): 1 via ORAL
  Filled 2023-10-11 (×11): qty 1

## 2023-10-11 MED ORDER — INSULIN ASPART 100 UNIT/ML IJ SOLN
2.0000 [IU] | Freq: Three times a day (TID) | INTRAMUSCULAR | Status: DC
  Administered 2023-10-11 – 2023-10-12 (×3): 2 [IU] via SUBCUTANEOUS

## 2023-10-11 MED ORDER — LINEZOLID 600 MG PO TABS
600.0000 mg | ORAL_TABLET | Freq: Two times a day (BID) | ORAL | Status: DC
  Administered 2023-10-11 – 2023-10-13 (×5): 600 mg via ORAL
  Filled 2023-10-11 (×6): qty 1

## 2023-10-11 NOTE — Progress Notes (Signed)
 [facility]                                                                               [name], is a 78 y.o. male, DOB - 05-03-1945, FMW:[id number] Admit date - 10/09/2023    Outpatient Primary MD for the patient is Craig Elsie SAUNDERS, MD  LOS - 2  days    Brief summary   [name] is a 78 y.o. male with medical history significant of DM2 , adrenal insufficiency, CKD stage III, CAD status post stents, HLD HTN OSA PVD sarcoidosis OSA hypothyroidism, Charcot right foot , presents with cellulitis of the right foot.    Assessment & Plan    Assessment and Plan:  Left foot infection Patient currently on IV antibiotics, transition to oral linezolid  and IV Unasyn . Vascular surgery and podiatry consulted and have signed off as patient wishes to see his surgeon at [facility]. Pain controlled with Neurontin  Leukocytosis is improving    Uncontrolled hypertension Patient on metoprolol , added Norvasc  5 mg and as needed hydralazine .   Type 2 diabetes mellitus with hyperglycemia CBG (last 3)  Recent Labs    10/11/23 0323 10/11/23 0727 10/11/23 1112  GLUCAP 104* 187* 367*   Resume sliding scale insulin  and NovoLog  and 20 units twice daily    Hypothyroidism Continue with Synthroid  75 mcg daily.   Stage IIIa CKD Creatinine slightly worse than baseline.    Body mass index is 28.71 kg/m. Obesity class I    Hypokalemia,  Replaced.    Pulmonary Sarcoidosis Resume prednisone .   Hyponatremia Resolved   Hypomagnesemia Replaced   Estimated body mass index is 28.71 kg/m as calculated from the following:   Height as of this encounter: 5' 10 (1.778 m).   Weight as of this encounter: 90.8 kg.  Code Status: Full code DVT Prophylaxis:  SCDs Start: 10/09/23 2343   Level of Care: Level of care: Progressive Family Communication: Updated patient's family at bedside  Disposition Plan:     Remains inpatient appropriate: Pending clinical  improvement  Procedures:  None Consultants:   [facility] Podiatry  Antimicrobials:   Anti-infectives (From admission, onward)    Start     Dose/Rate Route Frequency Ordered Stop   10/11/23 1400  vancomycin  (VANCOREADY) IVPB 1750 mg/350 mL  Status:  Discontinued        1,750 mg 175 mL/hr over 120 Minutes Intravenous Every 24 hours 10/10/23 1405 10/11/23 1149   10/11/23 1230  linezolid  (ZYVOX ) tablet 600 mg        600 mg Oral Every 12 hours 10/11/23 1149     10/10/23 1400  vancomycin  (VANCOREADY) IVPB 1250 mg/250 mL  Status:  Discontinued        1,250 mg 166.7 mL/hr over 90 Minutes Intravenous Every 24 hours 10/09/23 2022 10/10/23 1405   10/09/23 2200  Ampicillin -Sulbactam (UNASYN ) 3 g in sodium chloride  0.9 % 100 mL IVPB        3 g 200 mL/hr over 30 Minutes Intravenous Every 6 hours 10/09/23 2022     10/09/23 1330  vancomycin  (VANCOREADY) IVPB 1500 mg/300 mL        1,500 mg 150 mL/hr over  120 Minutes Intravenous  Once 10/09/23 1317 10/09/23 1726   10/09/23 1315  vancomycin  (VANCOCIN ) IVPB 1000 mg/200 mL premix  Status:  Discontinued        1,000 mg 200 mL/hr over 60 Minutes Intravenous  Once 10/09/23 1314 10/09/23 1317        Medications  Scheduled Meds:  clopidogrel   75 mg Oral Daily   ezetimibe   10 mg Oral Daily   gabapentin   100 mg Oral Daily   insulin  aspart  0-9 Units Subcutaneous Q4H   insulin  NPH Human  20 Units Subcutaneous BID AC & HS   levothyroxine   75 mcg Oral Q0600   linezolid   600 mg Oral Q12H   metoprolol  tartrate  12.5 mg Oral BID   pantoprazole   40 mg Oral Daily   potassium & sodium phosphates   1 packet Oral TID WC & HS   predniSONE   10 mg Oral Q breakfast   rosuvastatin   20 mg Oral QHS   Continuous Infusions:  ampicillin -sulbactam (UNASYN ) IV 3 g (10/11/23 0844)   PRN Meds:.acetaminophen , albuterol , fentaNYL  (SUBLIMAZE ) injection, montelukast , ondansetron  **OR** ondansetron  (ZOFRAN ) IV, oxyCODONE     Subjective:   [name] was seen and  examined today. Pt wants to stay one more day and get IV antibiotics.   Objective:   Vitals:   10/11/23 1139 10/11/23 1147 10/11/23 1149 10/11/23 1150  BP: 97/81 (!) 195/91 (!) 172/69 (!) 165/74  Pulse:      Resp:      Temp:      TempSrc:      SpO2:      Weight:      Height:        Intake/Output Summary (Last 24 hours) at 10/11/2023 1217 Last data filed at 10/11/2023 0834 Gross per 24 hour  Intake 240 ml  Output 975 ml  Net -735 ml   Filed Weights   10/09/23 1239 10/09/23 2322  Weight: 93 kg 90.8 kg     Exam General exam: Appears calm and comfortable  Respiratory system: Clear to auscultation. Respiratory effort normal. Cardiovascular system: S1 & S2 heard, RRR. No JVD,  Gastrointestinal system: Abdomen is nondistended, soft and nontender.  Central nervous system: Alert and oriented.  Extremities: Symmetric 5 x 5 power. Skin: No rashes,  Psychiatry: Mood & affect appropriate.     Data Reviewed:  I have personally reviewed following labs and imaging studies   CBC Lab Results  Component Value Date   WBC 11.0 (H) 10/11/2023   RBC 4.09 (L) 10/11/2023   HGB 13.4 10/11/2023   HCT 40.2 10/11/2023   MCV 98.3 10/11/2023   MCH 32.8 10/11/2023   PLT 238 10/11/2023   MCHC 33.3 10/11/2023   RDW 13.1 10/11/2023   LYMPHSABS 1.2 10/11/2023   MONOABS 1.0 10/11/2023   EOSABS 0.2 10/11/2023   BASOSABS 0.1 10/11/2023     Last metabolic panel Lab Results  Component Value Date   NA 137 10/11/2023   K 3.2 (L) 10/11/2023   CL 102 10/11/2023   CO2 26 10/11/2023   BUN 17 10/11/2023   CREATININE 1.45 (H) 10/11/2023   GLUCOSE 117 (H) 10/11/2023   GFRNONAA 50 (L) 10/11/2023   GFRAA 34 (L) 01/09/2020   CALCIUM  8.6 (L) 10/11/2023   PHOS 2.0 (L) 10/11/2023   PROT 5.7 (L) 10/10/2023   ALBUMIN 2.4 (L) 10/10/2023   LABGLOB 3.2 10/06/2018   AGRATIO 1.0 10/06/2018   BILITOT 1.1 10/10/2023   ALKPHOS 49 10/10/2023   AST 24 10/10/2023  ALT 17 10/10/2023   ANIONGAP 9  10/11/2023    CBG (last 3)  Recent Labs    10/11/23 0323 10/11/23 0727 10/11/23 1112  GLUCAP 104* 187* 367*      Coagulation Profile: No results for input(s): INR, PROTIME in the last 168 hours.   Radiology Studies: VAS US  ABI WITH/WO TBI Result Date: 10/10/2023  LOWER EXTREMITY DOPPLER STUDY Patient Name:  HARMON BOMMARITO  Date of Exam:   10/10/2023 Medical Rec #: [id number]   Accession #:    [id number] Date of Birth: 06-02-1945   Patient Gender: M Patient Age:   67 years Exam Location:  [facility] Procedure:      VAS US  ABI WITH/WO TBI Referring Phys: BLEASE DOUTOVA --------------------------------------------------------------------------------  Indications: Ulceration. High Risk Factors: Hypertension, hyperlipidemia, Diabetes, no history of                    smoking, coronary artery disease. Other Factors: CHF, CKD,.  Comparison Study: Previous exam 04/26/2022 Performing Technologist: Leigh Rom RVT, RDMS  Examination Guidelines: A complete evaluation includes at minimum, Doppler waveform signals and systolic blood pressure reading at the level of bilateral brachial, anterior tibial, and posterior tibial arteries, when vessel segments are accessible. Bilateral testing is considered an integral part of a complete examination. Photoelectric Plethysmograph (PPG) waveforms and toe systolic pressure readings are included as required and additional duplex testing as needed. Limited examinations for reoccurring indications may be performed as noted.  ABI Findings: +---------+------------------+-----+-----------+--------+ Right    Rt Pressure (mmHg)IndexWaveform   Comment  +---------+------------------+-----+-----------+--------+ Brachial 130                    triphasic           +---------+------------------+-----+-----------+--------+ PTA                             multiphasic>255     +---------+------------------+-----+-----------+--------+ DP                               multiphasic>255     +---------+------------------+-----+-----------+--------+ Great Toe69                0.53 Abnormal            +---------+------------------+-----+-----------+--------+ +---------+------------------+-----+----------+-------+ Left     Lt Pressure (mmHg)IndexWaveform  Comment +---------+------------------+-----+----------+-------+ Brachial 131                    triphasic         +---------+------------------+-----+----------+-------+ PTA                             monophasic>255    +---------+------------------+-----+----------+-------+ DP                              monophasic>255    +---------+------------------+-----+----------+-------+ Great Toe41                0.31 Abnormal          +---------+------------------+-----+----------+-------+ +-------+-----------+-----------+------------+------------+ ABI/TBIToday's ABIToday's TBIPrevious ABIPrevious TBI +-------+-----------+-----------+------------+------------+ Right  [place]         0.53       [place]          0.46         +-------+-----------+-----------+------------+------------+ Left     0.31       [place]          0.50         +-------+-----------+-----------+------------+------------+  Left TBIs appear decreased.  Summary: Right: Resting right ankle-brachial index indicates noncompressible right lower extremity arteries. The right toe-brachial index is abnormal. Left: Resting left ankle-brachial index indicates noncompressible left lower extremity arteries. The left toe-brachial index is abnormal. *See table(s) above for measurements and observations.  Electronically signed by Penne Colorado MD on 10/10/2023 at 4:42:02 PM.    Final    MR FOOT LEFT WO CONTRAST Result Date: 10/10/2023 CLINICAL DATA:  diabetic foot ulcer, cellulitis of LLE EXAM: MRI OF THE LEFT FOOT WITHOUT CONTRAST TECHNIQUE: Multiplanar, multisequence MR imaging of the left foot was performed. No intravenous contrast was  administered. COMPARISON:  Left foot radiographs dated 10/09/2023. MRI of the left foot dated 01/20/2020. FINDINGS: Bones/Joint/Cartilage Focal soft tissue wound at the lateral plantar midfoot. There is increased T2/STIR hyperintense edema like signal of the plantar margin of the underlying cuboid bone with mild cortical irregularity. No significant corresponding T1 hypointense marrow signal abnormality. These findings are concerning for mild or early acute osteomyelitis versus reactive marrow changes. No convincing marrow signal abnormality identified elsewhere to suggest acute osteomyelitis. Chronic changes of Charcot arthropathy of the midfoot with midfoot collapse. Prior first MTP joint arthrodesis with associated susceptibility artifact. No evidence of acute fracture or dislocation. No joint effusion. Ligaments Chronic Charcot changes at the level of the Lisfranc interval. No evidence of acute ligamentous injury. Muscles and Tendons Diffuse atrophy of the intrinsic musculature of the foot, most compatible with chronic denervation changes. No tenosynovitis. Soft tissue Focal soft tissue wound at the plantar lateral midfoot with soft tissue edema and more patchy areas of T2 hyperintense signal which could reflect phlegmonous change. No drainable abscess. Subcutaneous edema of the visualized foot. No soft tissue mass. IMPRESSION: 1. Focal soft tissue wound at the plantar lateral midfoot with cellulitis and possible underlying phlegmonous change. No drainable abscess. 2. Marrow edema of the plantar margin of the underlying cuboid bone with mild cortical irregularity. No significant corresponding T1 hypointense marrow signal abnormality. These findings are concerning for mild or early acute osteomyelitis versus reactive marrow changes. 3. Chronic changes of Charcot arthropathy of the midfoot with midfoot collapse. Electronically Signed   By: Harrietta Sherry M.D.   On: 10/10/2023 09:48   DG CHEST PORT 1  VIEW Result Date: 10/09/2023 CLINICAL DATA:  Preop. EXAM: PORTABLE CHEST 1 VIEW COMPARISON:  08/01/2022 and CT chest 08/02/2022. FINDINGS: Trachea is midline. Heart is enlarged, stable. Linear scarring in the left upper lobe. Mild basilar atelectasis or scarring. No pleural fluid. IMPRESSION: Mild bibasilar atelectasis or scarring. Electronically Signed   By: Newell Eke M.D.   On: 10/09/2023 21:35   DG Foot Complete Left Result Date: 10/09/2023 CLINICAL DATA:  Wound infection with worsening pain. EXAM: LEFT FOOT - COMPLETE 3+ VIEW COMPARISON:  None Available. FINDINGS: Soft tissue regularity and thickening along the plantar aspect of the mid and forefoot with forefoot soft tissue swelling. Difficult to exclude a tiny erosion along the medial base of the first metatarsal. Charcot foot changes. Two screws traverse a fused first metatarsophalangeal joint. Plantar calcaneal spur. IMPRESSION: 1. Soft tissue wound along the plantar midfoot with diffuse forefoot soft tissue swelling. Possible osseous erosion along the medial base of the first metatarsal. Osteomyelitis cannot be excluded. 2. Charcot foot. Electronically Signed   By: Newell Eke M.D.   On: 10/09/2023  14:25       Elgie Butter M.D. [facility] 10/11/2023, 12:17 PM  Available via Epic secure chat 7am-7pm After 7 pm, please refer to night coverage provider listed on amion.  ;a

## 2023-10-11 NOTE — Progress Notes (Signed)
 Initial Nutrition Assessment  DOCUMENTATION CODES:   Not applicable  INTERVENTION:   -Continue carb modified diet -MVI with minerals daily -500 mg vitamin C  BID -220 mg zinc  sulfate daily x 14 days -1 packet Juven BID, each packet provides 95 calories, 2.5 grams of protein (collagen), and 9.8 grams of carbohydrate (3 grams sugar); also contains 7 grams of L-arginine and L-glutamine, 300 mg vitamin C , 15 mg vitamin E, 1.2 mcg vitamin B-12, 9.5 mg zinc , 200 mg calcium , and 1.5 g  Calcium  Beta-hydroxy-Beta-methylbutyrate to support wound healing  -Ensure Max po daily, each supplement provides 150 kcal and 30 grams of protein.   -RD provided Carbohydrate Counting for People with Diabetes and Plate Method handouts from [facility] Nutrition Care Manual; attached to AVS/ discharge summary  -RD referred pt to [facility] for additional support and reinforcement after discharge  NUTRITION DIAGNOSIS:   Increased nutrient needs related to wound healing as evidenced by estimated needs.  GOAL:   Patient will meet greater than or equal to 90% of their needs  MONITOR:   PO intake, Supplement acceptance  REASON FOR ASSESSMENT:   Consult Assessment of nutrition requirement/status, Wound healing  ASSESSMENT:   Pt admitted from home with c/o increased pain and swelling to left foot. PMH significant for DM2, adrenal insufficiency, CKD stage III, CAD s/p stents, HLD, HTN, PVD, hypothyroidism, charcot right foot secondary to diabetes.  Pt admitted with increased pain and swelling of lt foot; pt with lt foot wound for 2 weeks PTA.   Reviewed I/O's: -875 ml x 24 hours and +351 ml since admission  UOP: 875 ml x 24 hours  Pt unavailable at time of visit. Attempted to speak with pt via call to hospital room phone, however, unable to reach. RD unable to obtain further nutrition-related history or complete nutrition-focused physical exam at this time.     Per H&P, pt developed a blister after he stepped on something. Per [facility] note, pt with full thickness wound to lt plantar foot likely diabetic ulcer, patient with known charcot foot deformity . Noted that has been recommended amputation, however, pt refuses this.   Per podiatry notes, erythema and edema have improves significantly since admission. Pt continues to decline surgery. Plan to follow-up with pt's outpatient podiatrist in [facility].   Case discussed with RN, who reports plan to start an additional day for antibiotics.   Pt currently on a carb modified diet. Noted meal completions 100%.   Reviewed wt hx; pt has experienced a 6% wt loss over the past 3 months, which is not significant for time frame. Suspect wt loss may be related to uncontrolled DM. Suspect prednisone  use may also be contributing to elevated CBGS.   Medications reviewed and include protonix , neurontin , and prednisone .   Lab Results  Component Value Date   HGBA1C 10.6 (H) 10/09/2023   PTA DM medications are 40 units novolin 70/30 BID with meals.   Labs reviewed: K: 3.2, Phos: 2.0, CBGS: 104-140 (inpatient orders for glycemic control are 0-9 units insulin  aspart every 4 hours and 20 units insulin  NPH BID).    Diet Order:   Diet Order             Diet Carb Modified Fluid consistency: Thin; Room service appropriate? Yes with Assist  Diet effective now                   EDUCATION NEEDS:   No education needs have been identified  at this time  Skin:  Skin Assessment: Skin Integrity Issues: Skin Integrity Issues:: Other (Comment) Stage II: - Other: ull thickness wound to lt plantar foot likely diabetic ulcer, patient with known charcot foot deformit  Last BM:  Unknown  Height:   Ht Readings from Last 1 Encounters:  10/09/23 5' 10 (1.778 m)    Weight:   Wt Readings from Last 1 Encounters:  10/09/23 90.8 kg    Ideal Body Weight:  75.5 kg  BMI:  Body mass index is 28.71  kg/m.  Estimated Nutritional Needs:   Kcal:  1900-2100  Protein:  105-120 grams  Fluid:  1.9-2.1 L    Margery ORN, RD, LDN, CDCES Registered Dietitian III Certified Diabetes Care and Education Specialist If unable to reach this RD, please use RD Inpatient group chat on secure chat between hours of 8am-4 pm daily

## 2023-10-11 NOTE — Progress Notes (Signed)
 Mobility Specialist Progress Note:   10/11/23 1153  Mobility  Activity Ambulated with assistance  Level of Assistance Contact guard assist, steadying assist  Assistive Device Front wheel walker  Distance Ambulated (ft) 500 ft  Activity Response Tolerated well  Mobility Referral Yes  Mobility visit 1 Mobility  Mobility Specialist Start Time (ACUTE ONLY) 1130  Mobility Specialist Stop Time (ACUTE ONLY) 1145  Mobility Specialist Time Calculation (min) (ACUTE ONLY) 15 min   Pt received in bed, agreeable to mobility session. At first stand, pt reported dizziness which did subside. BP 97/81 (88). Ambulated in hallway with RW. MinG for safety. Returned pt to room. See BP below, reported results to RN. Left pt in chair, wife at bedside, all needs met.    Sitting in Chair 1 min: BP 195/91 (118) Sitting in Chair 3 min: BP 172/69 (99) Sitting in Chair 5 min: 165/72 (101)  Viera Okonski Mobility Specialist Please contact via Special educational needs teacher or  Rehab office at [phone]

## 2023-10-11 NOTE — Discharge Instructions (Signed)

## 2023-10-11 NOTE — Progress Notes (Addendum)
  Progress Note    10/11/2023 8:20 AM * No surgery found *  Subjective: Left foot feels better  Vitals:   10/11/23 0340 10/11/23 0726  BP: (!) 149/77   Pulse: 62   Resp: 15   Temp: 97.9 F (36.6 C) 97.6 F (36.4 C)  SpO2: 98%     Physical Exam: Awake alert oriented Sitting up eating breakfast  CBC    Component Value Date/Time   WBC 11.0 (H) 10/11/2023 0321   RBC 4.09 (L) 10/11/2023 0321   HGB 13.4 10/11/2023 0321   HGB 11.6 (L) 12/28/2021 1252   HCT 40.2 10/11/2023 0321   HCT 37.9 12/28/2021 1252   PLT 238 10/11/2023 0321   PLT 327 12/28/2021 1252   MCV 98.3 10/11/2023 0321   MCV 90 12/28/2021 1252   MCH 32.8 10/11/2023 0321   MCHC 33.3 10/11/2023 0321   RDW 13.1 10/11/2023 0321   RDW 15.7 (H) 12/28/2021 1252   LYMPHSABS 1.2 10/11/2023 0321   MONOABS 1.0 10/11/2023 0321   EOSABS 0.2 10/11/2023 0321   BASOSABS 0.1 10/11/2023 0321    BMET    Component Value Date/Time   NA 137 10/11/2023 0321   NA 142 12/28/2021 1252   K 3.2 (L) 10/11/2023 0321   CL 102 10/11/2023 0321   CO2 26 10/11/2023 0321   GLUCOSE 117 (H) 10/11/2023 0321   BUN 17 10/11/2023 0321   BUN 19 12/28/2021 1252   CREATININE 1.45 (H) 10/11/2023 0321   CREATININE 2.17 (H) 01/09/2020 0940   CALCIUM  8.6 (L) 10/11/2023 0321   GFRNONAA 50 (L) 10/11/2023 0321   GFRNONAA 29 (L) 01/09/2020 0940   GFRAA 34 (L) 01/09/2020 0940    INR    Component Value Date/Time   INR 1.3 (H) 03/16/2022 0458     Intake/Output Summary (Last 24 hours) at 10/11/2023 0820 Last data filed at 10/11/2023 0334 Gross per 24 hour  Intake --  Output 875 ml  Net -875 ml     Assessment:  78 y.o. male is here with left plantar infection wishes to be evaluated for site by his previous foot Careers adviser.  Patient is adamant did not have any procedures until he has discussed with his primary foot surgeon.  Plan: Patient does not currently wish to proceed with angiography we will be available as needed.  Patient to call  call for follow-up as needed   Madelynn Malson C. Sheree, MD [facility] Office: [phone] Pager: [phone]  10/11/2023 8:20 AM

## 2023-10-11 NOTE — Plan of Care (Signed)
 Went to see pt this AM and discuss surgery here with me. Erythema and edema have improved significantly since admission and wound appears more stable this AM with less maceration.       Patient wishes to leave to follow up with his outpatient podiatrist at [facility] center which is reasonable.   I offered surgery once again and he declines. Aware of the risk associated with this decision including worsening infection, limb loss, sepsis. Discussed precautions to watch for and if they occur to go to ED.  Recommend broad spectrum PO  x 7 days on discharge.   Continue use of offloading post op shoe with cutout at site of ulceration for offloading, reduce WB as able.   Stable for DC today from my standpoint so that he may follow up with podiatrist in [place] or proceed to [facility].   I will sign off at this time.        Marolyn JULIANNA Honour, DPM [facility]                   10/11/2023

## 2023-10-11 NOTE — Progress Notes (Signed)
  Progress Note    10/11/2023 8:18 AM * No surgery found *  Subjective:  resting comfortably in bed   Vitals:   10/11/23 0340 10/11/23 0726  BP: (!) 149/77   Pulse: 62   Resp: 15   Temp: 97.9 F (36.6 C) 97.6 F (36.4 C)  SpO2: 98%    Physical Exam: Cardiac:  regular Lungs:  non labored Extremities:  no palpable pedal pulses in left foot. Left foot dressed Neurologic: alert and oriented  CBC    Component Value Date/Time   WBC 11.0 (H) 10/11/2023 0321   RBC 4.09 (L) 10/11/2023 0321   HGB 13.4 10/11/2023 0321   HGB 11.6 (L) 12/28/2021 1252   HCT 40.2 10/11/2023 0321   HCT 37.9 12/28/2021 1252   PLT 238 10/11/2023 0321   PLT 327 12/28/2021 1252   MCV 98.3 10/11/2023 0321   MCV 90 12/28/2021 1252   MCH 32.8 10/11/2023 0321   MCHC 33.3 10/11/2023 0321   RDW 13.1 10/11/2023 0321   RDW 15.7 (H) 12/28/2021 1252   LYMPHSABS 1.2 10/11/2023 0321   MONOABS 1.0 10/11/2023 0321   EOSABS 0.2 10/11/2023 0321   BASOSABS 0.1 10/11/2023 0321    BMET    Component Value Date/Time   NA 137 10/11/2023 0321   NA 142 12/28/2021 1252   K 3.2 (L) 10/11/2023 0321   CL 102 10/11/2023 0321   CO2 26 10/11/2023 0321   GLUCOSE 117 (H) 10/11/2023 0321   BUN 17 10/11/2023 0321   BUN 19 12/28/2021 1252   CREATININE 1.45 (H) 10/11/2023 0321   CREATININE 2.17 (H) 01/09/2020 0940   CALCIUM  8.6 (L) 10/11/2023 0321   GFRNONAA 50 (L) 10/11/2023 0321   GFRNONAA 29 (L) 01/09/2020 0940   GFRAA 34 (L) 01/09/2020 0940    INR    Component Value Date/Time   INR 1.3 (H) 03/16/2022 0458     Intake/Output Summary (Last 24 hours) at 10/11/2023 0818 Last data filed at 10/11/2023 0334 Gross per 24 hour  Intake --  Output 875 ml  Net -875 ml     Assessment/Plan:  78 y.o. male with L diabetic foot wound. He has non compressible vessels with diminished toe pressures. Angiography was offered to further evaluate/ possibly improve the perfusion to his left foot, but he is adamant about returning to  [place] to have his left foot managed by his Podiatrist there. He says he will have a procedure on his arteries later if he needs it. He states that his main focus is on his foot and keeping his foot. I re explained that with his poor blood flow that he is high risk for his wound to not heal and requiring more proximal amputation and he voiced his understanding. Vascular will be available as needed     Teretha Damme, PA-C [facility] [phone] 10/11/2023 8:18 AM

## 2023-10-12 DIAGNOSIS — L97424 Non-pressure chronic ulcer of left heel and midfoot with necrosis of bone: Secondary | ICD-10-CM | POA: Diagnosis not present

## 2023-10-12 DIAGNOSIS — E7800 Pure hypercholesterolemia, unspecified: Secondary | ICD-10-CM | POA: Diagnosis not present

## 2023-10-12 DIAGNOSIS — I10 Essential (primary) hypertension: Secondary | ICD-10-CM | POA: Diagnosis not present

## 2023-10-12 DIAGNOSIS — N179 Acute kidney failure, unspecified: Secondary | ICD-10-CM | POA: Diagnosis not present

## 2023-10-12 LAB — CBC WITH DIFFERENTIAL/PLATELET
Abs Immature Granulocytes: 0.49 K/uL — ABNORMAL HIGH (ref 0.00–0.07)
Basophils Absolute: 0.1 K/uL (ref 0.0–0.1)
Basophils Relative: 1 %
Eosinophils Absolute: 0.2 K/uL (ref 0.0–0.5)
Eosinophils Relative: 1 %
HCT: 39.2 % (ref 39.0–52.0)
Hemoglobin: 13.1 g/dL (ref 13.0–17.0)
Immature Granulocytes: 4 %
Lymphocytes Relative: 13 %
Lymphs Abs: 1.6 K/uL (ref 0.7–4.0)
MCH: 33.1 pg (ref 26.0–34.0)
MCHC: 33.4 g/dL (ref 30.0–36.0)
MCV: 99 fL (ref 80.0–100.0)
Monocytes Absolute: 0.9 K/uL (ref 0.1–1.0)
Monocytes Relative: 7 %
Neutro Abs: 9.2 K/uL — ABNORMAL HIGH (ref 1.7–7.7)
Neutrophils Relative %: 74 %
Platelets: 260 K/uL (ref 150–400)
RBC: 3.96 MIL/uL — ABNORMAL LOW (ref 4.22–5.81)
RDW: 13 % (ref 11.5–15.5)
WBC: 12.5 K/uL — ABNORMAL HIGH (ref 4.0–10.5)
nRBC: 0 % (ref 0.0–0.2)

## 2023-10-12 LAB — GLUCOSE, CAPILLARY
Glucose-Capillary: 285 mg/dL — ABNORMAL HIGH (ref 70–99)
Glucose-Capillary: 355 mg/dL — ABNORMAL HIGH (ref 70–99)
Glucose-Capillary: 372 mg/dL — ABNORMAL HIGH (ref 70–99)
Glucose-Capillary: 377 mg/dL — ABNORMAL HIGH (ref 70–99)
Glucose-Capillary: 410 mg/dL — ABNORMAL HIGH (ref 70–99)
Glucose-Capillary: 433 mg/dL — ABNORMAL HIGH (ref 70–99)

## 2023-10-12 LAB — BASIC METABOLIC PANEL WITH GFR
Anion gap: 12 (ref 5–15)
BUN: 27 mg/dL — ABNORMAL HIGH (ref 8–23)
CO2: 23 mmol/L (ref 22–32)
Calcium: 8.6 mg/dL — ABNORMAL LOW (ref 8.9–10.3)
Chloride: 100 mmol/L (ref 98–111)
Creatinine, Ser: 1.48 mg/dL — ABNORMAL HIGH (ref 0.61–1.24)
GFR, Estimated: 48 mL/min — ABNORMAL LOW (ref >60)
Glucose, Bld: 380 mg/dL — ABNORMAL HIGH (ref 70–99)
Potassium: 4.6 mmol/L (ref 3.5–5.1)
Sodium: 135 mmol/L (ref 135–145)

## 2023-10-12 MED ORDER — INSULIN ASPART 100 UNIT/ML IJ SOLN
4.0000 [IU] | Freq: Once | INTRAMUSCULAR | Status: AC
  Administered 2023-10-12: 4 [IU] via SUBCUTANEOUS

## 2023-10-12 MED ORDER — INSULIN ASPART 100 UNIT/ML IJ SOLN
4.0000 [IU] | Freq: Three times a day (TID) | INTRAMUSCULAR | Status: DC
  Administered 2023-10-12 – 2023-10-13 (×3): 4 [IU] via SUBCUTANEOUS

## 2023-10-12 MED ORDER — INSULIN NPH (HUMAN) (ISOPHANE) 100 UNIT/ML ~~LOC~~ SUSP
28.0000 [IU] | Freq: Two times a day (BID) | SUBCUTANEOUS | Status: DC
  Administered 2023-10-12 – 2023-10-13 (×2): 28 [IU] via SUBCUTANEOUS
  Filled 2023-10-12: qty 10

## 2023-10-12 MED ORDER — INSULIN ASPART 100 UNIT/ML IJ SOLN
0.0000 [IU] | Freq: Every day | INTRAMUSCULAR | Status: DC
  Administered 2023-10-12: 5 [IU] via SUBCUTANEOUS

## 2023-10-12 MED ORDER — INSULIN ASPART 100 UNIT/ML IJ SOLN
0.0000 [IU] | Freq: Three times a day (TID) | INTRAMUSCULAR | Status: DC
  Administered 2023-10-12: 8 [IU] via SUBCUTANEOUS
  Administered 2023-10-13 (×2): 5 [IU] via SUBCUTANEOUS

## 2023-10-12 NOTE — Progress Notes (Signed)
 [facility]                                                                               [name], is a 78 y.o. male, DOB - 12-31-1945, FMW:[id number] Admit date - 10/09/2023    Outpatient Primary MD for the patient is Arloa Elsie SAUNDERS, MD  LOS - 3  days    Brief summary   [name] is a 78 y.o. male with medical history significant of DM2 , adrenal insufficiency, CKD stage III, CAD status post stents, HLD HTN OSA PVD sarcoidosis OSA hypothyroidism, Charcot right foot , presents with cellulitis of the right foot.    Assessment & Plan    Assessment and Plan:  Left foot infection Patient currently on IV antibiotics, transition to oral linezolid  and IV Unasyn . Vascular surgery and podiatry consulted and have signed off as patient wishes to see his surgeon at [facility]. Pain controlled with Neurontin  Afebrile, with persistent leukocytosis.    Hypertension.  BP parameters are optimal.  Continue with metoprolol  , norvasc  added along with hydralazine  prn.     Type 2 diabetes mellitus with hyperglycemia CBG (last 3)  Recent Labs    10/12/23 0442 10/12/23 0638 10/12/23 1220  GLUCAP 377* 372* 355*   Increased NPH (Novolin N) to 28 units BID and moderate SSI and add 4 units of Novolog  TIDAC.    Hypothyroidism Continue with Synthroid  75 mcg daily.   Stage IIIa CKD Looks like baseline creatinine ranges between 1.2 to 1.5.  Currently at 1.4 and stable.    Body mass index is 28.71 kg/m. Obesity class I    Hypokalemia,  Replaced. Repeat levels wnl.    Pulmonary Sarcoidosis Resume prednisone .   Hyponatremia Resolved   Hypomagnesemia Replaced   Estimated body mass index is 28.71 kg/m as calculated from the following:   Height as of this encounter: 5' 10 (1.778 m).   Weight as of this encounter: 90.8 kg.  Code Status: Full code DVT Prophylaxis:  SCDs Start: 10/09/23 2343   Level of Care: Level of care: Progressive Family  Communication: Updated patient's family at bedside  Disposition Plan:     Remains inpatient appropriate: Pending clinical improvement  Procedures:  None Consultants:   [facility] Podiatry  Antimicrobials:   Anti-infectives (From admission, onward)    Start     Dose/Rate Route Frequency Ordered Stop   10/11/23 1400  vancomycin  (VANCOREADY) IVPB 1750 mg/350 mL  Status:  Discontinued        1,750 mg 175 mL/hr over 120 Minutes Intravenous Every 24 hours 10/10/23 1405 10/11/23 1149   10/11/23 1230  linezolid  (ZYVOX ) tablet 600 mg        600 mg Oral Every 12 hours 10/11/23 1149     10/10/23 1400  vancomycin  (VANCOREADY) IVPB 1250 mg/250 mL  Status:  Discontinued        1,250 mg 166.7 mL/hr over 90 Minutes Intravenous Every 24 hours 10/09/23 2022 10/10/23 1405   10/09/23 2200  Ampicillin -Sulbactam (UNASYN ) 3 g in sodium chloride  0.9 % 100 mL IVPB        3 g 200 mL/hr over 30 Minutes Intravenous Every 6 hours  10/09/23 2022     10/09/23 1330  vancomycin  (VANCOREADY) IVPB 1500 mg/300 mL        1,500 mg 150 mL/hr over 120 Minutes Intravenous  Once 10/09/23 1317 10/09/23 1726   10/09/23 1315  vancomycin  (VANCOCIN ) IVPB 1000 mg/200 mL premix  Status:  Discontinued        1,000 mg 200 mL/hr over 60 Minutes Intravenous  Once 10/09/23 1314 10/09/23 1317        Medications  Scheduled Meds:  amLODipine   5 mg Oral Daily   ascorbic acid   500 mg Oral BID   clopidogrel   75 mg Oral Daily   ezetimibe   10 mg Oral Daily   gabapentin   100 mg Oral Daily   insulin  aspart  0-15 Units Subcutaneous TID WC   insulin  aspart  0-5 Units Subcutaneous QHS   insulin  aspart  4 Units Subcutaneous TID WC   insulin  NPH Human  28 Units Subcutaneous BID AC & HS   levothyroxine   75 mcg Oral Q0600   linezolid   600 mg Oral Q12H   metoprolol  tartrate  12.5 mg Oral BID   multivitamin with minerals  1 tablet Oral Daily   nutrition supplement (JUVEN)  1 packet Oral BID BM   pantoprazole   40 mg Oral Daily    potassium & sodium phosphates   1 packet Oral TID WC & HS   predniSONE   10 mg Oral Q breakfast   Ensure Max Protein  11 oz Oral QHS   rosuvastatin   20 mg Oral QHS   zinc  sulfate (50mg  elemental zinc )  220 mg Oral Daily   Continuous Infusions:  ampicillin -sulbactam (UNASYN ) IV 3 g (10/12/23 1004)   PRN Meds:.acetaminophen , albuterol , fentaNYL  (SUBLIMAZE ) injection, hydrALAZINE , montelukast , ondansetron  **OR** ondansetron  (ZOFRAN ) IV, oxyCODONE     Subjective:   [name] was seen and examined today. No new complaints.   Objective:   Vitals:   10/12/23 0006 10/12/23 0441 10/12/23 0845 10/12/23 1227  BP: 109/79 (!) 154/83 (!) 150/90 (!) 141/72  Pulse: 79 63 (!) 53 (!) 59  Resp: 18 18 16 20   Temp: (!) 97.5 F (36.4 C)  97.6 F (36.4 C) 97.7 F (36.5 C)  TempSrc: Oral Oral Oral Oral  SpO2:  100% 97% 93%  Weight:      Height:        Intake/Output Summary (Last 24 hours) at 10/12/2023 1359 Last data filed at 10/12/2023 1229 Gross per 24 hour  Intake 1520.06 ml  Output 2000 ml  Net -479.94 ml   Filed Weights   10/09/23 1239 10/09/23 2322  Weight: 93 kg 90.8 kg     Exam General exam: Appears calm and comfortable  Respiratory system: Clear to auscultation. Respiratory effort normal. Cardiovascular system: S1 & S2 heard, RRR. No JVD, Gastrointestinal system: Abdomen is nondistended, soft and nontender.  Central nervous system: Alert and oriented. No focal neurological deficits. Extremities: Symmetric 5 x 5 power. Skin: left foot wound bandaged.  Psychiatry: Mood & affect appropriate.      Data Reviewed:  I have personally reviewed following labs and imaging studies   CBC Lab Results  Component Value Date   WBC 12.5 (H) 10/12/2023   RBC 3.96 (L) 10/12/2023   HGB 13.1 10/12/2023   HCT 39.2 10/12/2023   MCV 99.0 10/12/2023   MCH 33.1 10/12/2023   PLT 260 10/12/2023   MCHC 33.4 10/12/2023   RDW 13.0 10/12/2023   LYMPHSABS 1.6 10/12/2023   MONOABS 0.9 10/12/2023    EOSABS 0.2 10/12/2023  BASOSABS 0.1 10/12/2023     Last metabolic panel Lab Results  Component Value Date   NA 135 10/12/2023   K 4.6 10/12/2023   CL 100 10/12/2023   CO2 23 10/12/2023   BUN 27 (H) 10/12/2023   CREATININE 1.48 (H) 10/12/2023   GLUCOSE 380 (H) 10/12/2023   GFRNONAA 48 (L) 10/12/2023   GFRAA 34 (L) 01/09/2020   CALCIUM  8.6 (L) 10/12/2023   PHOS 2.0 (L) 10/11/2023   PROT 5.7 (L) 10/10/2023   ALBUMIN 2.4 (L) 10/10/2023   LABGLOB 3.2 10/06/2018   AGRATIO 1.0 10/06/2018   BILITOT 1.1 10/10/2023   ALKPHOS 49 10/10/2023   AST 24 10/10/2023   ALT 17 10/10/2023   ANIONGAP 12 10/12/2023    CBG (last 3)  Recent Labs    10/12/23 0442 10/12/23 0638 10/12/23 1220  GLUCAP 377* 372* 355*      Coagulation Profile: No results for input(s): INR, PROTIME in the last 168 hours.   Radiology Studies: VAS US  ABI WITH/WO TBI Result Date: 10/10/2023  LOWER EXTREMITY DOPPLER STUDY Patient Name:  [name]  Date of Exam:   10/10/2023 Medical Rec #: [id number]   Accession #:    [id number] Date of Birth: Jan 15, 1946   Patient Gender: M Patient Age:   84 years Exam Location:  [facility] Procedure:      VAS US  ABI WITH/WO TBI Referring Phys: BLEASE DOUTOVA --------------------------------------------------------------------------------  Indications: Ulceration. High Risk Factors: Hypertension, hyperlipidemia, Diabetes, no history of                    smoking, coronary artery disease. Other Factors: CHF, CKD,.  Comparison Study: Previous exam 04/26/2022 Performing Technologist: Leigh Rom RVT, RDMS  Examination Guidelines: A complete evaluation includes at minimum, Doppler waveform signals and systolic blood pressure reading at the level of bilateral brachial, anterior tibial, and posterior tibial arteries, when vessel segments are accessible. Bilateral testing is considered an integral part of a complete examination. Photoelectric Plethysmograph (PPG) waveforms and toe  systolic pressure readings are included as required and additional duplex testing as needed. Limited examinations for reoccurring indications may be performed as noted.  ABI Findings: +---------+------------------+-----+-----------+--------+ Right    Rt Pressure (mmHg)IndexWaveform   Comment  +---------+------------------+-----+-----------+--------+ Brachial 130                    triphasic           +---------+------------------+-----+-----------+--------+ PTA                             multiphasic>255     +---------+------------------+-----+-----------+--------+ DP                              multiphasic>255     +---------+------------------+-----+-----------+--------+ Great Toe69                0.53 Abnormal            +---------+------------------+-----+-----------+--------+ +---------+------------------+-----+----------+-------+ Left     Lt Pressure (mmHg)IndexWaveform  Comment +---------+------------------+-----+----------+-------+ Brachial 131                    triphasic         +---------+------------------+-----+----------+-------+ PTA                             monophasic>255    +---------+------------------+-----+----------+-------+ DP  monophasic>255    +---------+------------------+-----+----------+-------+ Great Toe41                0.31 Abnormal          +---------+------------------+-----+----------+-------+ +-------+-----------+-----------+------------+------------+ ABI/TBIToday's ABIToday's TBIPrevious ABIPrevious TBI +-------+-----------+-----------+------------+------------+ Right  [place]         0.53       [place]          0.46         +-------+-----------+-----------+------------+------------+ Left   [place]         0.31       [place]          0.50         +-------+-----------+-----------+------------+------------+  Left TBIs appear decreased.  Summary: Right: Resting right ankle-brachial index indicates  noncompressible right lower extremity arteries. The right toe-brachial index is abnormal. Left: Resting left ankle-brachial index indicates noncompressible left lower extremity arteries. The left toe-brachial index is abnormal. *See table(s) above for measurements and observations.  Electronically signed by Penne Colorado MD on 10/10/2023 at 4:42:02 PM.    Final        Elgie Butter M.D. [facility] 10/12/2023, 1:59 PM  Available via Epic secure chat 7am-7pm After 7 pm, please refer to night coverage provider listed on amion.  ;a

## 2023-10-12 NOTE — Progress Notes (Signed)
 Physical Therapy Treatment Patient Details Name: [name] MRN: [id number] DOB: [birth year] Today's Date: 10/12/2023   History of Present Illness Pt is a 78 y.o. male admitted with LLE cellulitis. PMH significant for HTN, HLD, CAD with multiple stents, diastolic CHF, GERD, anemia, OSA not on CPAP, PAD, pneumonia, & hypothyroidism.    PT Comments  Pt is progressing well towards goals. Mod I for bed mobility, Sit to stand and gait with RW. Pt most likely will need a little help with a ramp at home at supervision/CGA. If pt continues to progress may DC from acute care hospital physical therapy at next session. Due to pt current functional status, home set up and available assistance at home recommending skilled physical therapy services 3x/week in order to address strength, balance and functional mobility to decrease risk for falls, injury and re-hospitalization.       If plan is discharge home, recommend the following: Assistance with cooking/housework;Assist for transportation;Help with stairs or ramp for entrance     Equipment Recommendations  None recommended by PT       Precautions / Restrictions Precautions Precautions: None Required Braces or Orthoses: Other Brace Other Brace: pt has CAM boot for LLE Restrictions Weight Bearing Restrictions Per Provider Order: No     Mobility  Bed Mobility Overal bed mobility: Modified Independent             General bed mobility comments: pt seated EOB upon arrival/departure, pt donned shoe and boot on his own    Transfers Overall transfer level: Modified independent Equipment used: Rolling walker (2 wheels) Transfers: Sit to/from Stand Sit to Stand: Modified independent (Device/Increase time)        Ambulation/Gait Ambulation/Gait assistance: Modified independent (Device/Increase time) Gait Distance (Feet): 500 Feet Assistive device: Rolling walker (2 wheels) Gait Pattern/deviations: Step-through pattern, Decreased stride  length, Decreased step length - right, Decreased step length - left, Decreased weight shift to left, Decreased stance time - left, Antalgic Gait velocity: slightly decreased Gait velocity interpretation: 1.31 - 2.62 ft/sec, indicative of limited community ambulator   General Gait Details: no LOB pt has antalgic gait pattern due to recent procedure.   Stairs Stairs:  (has ramp at home.)            Balance Overall balance assessment: Modified Independent Sitting-balance support: No upper extremity supported, Feet supported Sitting balance-Leahy Scale: Normal     Standing balance support: Bilateral upper extremity supported, During functional activity, Reliant on assistive device for balance Standing balance-Leahy Scale: Fair      Hotel manager: No apparent difficulties  Cognition Arousal: Alert Behavior During Therapy: WFL for tasks assessed/performed   PT - Cognitive impairments: No apparent impairments   Following commands: Intact      Cueing Cueing Techniques: Verbal cues         Pertinent Vitals/Pain Pain Assessment Pain Assessment: Faces Faces Pain Scale: Hurts a little bit Pain Location: bottom of left foot Pain Descriptors / Indicators: Discomfort, Dull Pain Intervention(s): Monitored during session     PT Goals (current goals can now be found in the care plan section) Acute Rehab PT Goals Patient Stated Goal: to go home PT Goal Formulation: With patient Time For Goal Achievement: 10/24/23 Potential to Achieve Goals: Good Progress towards PT goals: Progressing toward goals    Frequency    Min 2X/week      PT Plan  Continue with current POC        AM-PAC PT 6 Clicks Mobility  Outcome Measure  Help needed turning from your back to your side while in a flat bed without using bedrails?: None Help needed moving from lying on your back to sitting on the side of a flat bed without using bedrails?: None Help needed  moving to and from a bed to a chair (including a wheelchair)?: None Help needed standing up from a chair using your arms (e.g., wheelchair or bedside chair)?: None Help needed to walk in hospital room?: None Help needed climbing 3-5 steps with a railing? : A Little 6 Click Score: 23    End of Session Equipment Utilized During Treatment: Gait belt Activity Tolerance: Patient tolerated treatment well Patient left: with call bell/phone within reach;with family/visitor present;in bed Nurse Communication: Mobility status PT Visit Diagnosis: Muscle weakness (generalized) (M62.81)     Time: 8372-8349 PT Time Calculation (min) (ACUTE ONLY): 23 min  Charges:    $Therapeutic Activity: 23-37 mins PT General Charges $$ ACUTE PT VISIT: 1 Visit                    Dorothyann Maier, DPT, CLT  Acute Rehabilitation Services Office: [phone] (Secure chat preferred)    Dorothyann VEAR Maier 10/12/2023, 5:12 PM

## 2023-10-12 NOTE — Plan of Care (Signed)

## 2023-10-12 NOTE — Progress Notes (Signed)
 Patient had refused Plavix  this am saying he is having surgery next week in [place] and doctors there told him to stop taking his Plavix . DR. Cherlyn was on the floor. MD made aware. Plan of are continues.

## 2023-10-12 NOTE — Plan of Care (Signed)

## 2023-10-12 NOTE — Inpatient Diabetes Management (Signed)
 Inpatient Diabetes Program Recommendations  AACE/ADA: New Consensus Statement on Inpatient Glycemic Control (2015)  Target Ranges:  Prepandial:   less than 140 mg/dL      Peak postprandial:   less than 180 mg/dL (1-2 hours)      Critically ill patients:  140 - 180 mg/dL   Lab Results  Component Value Date   GLUCAP 372 (H) 10/12/2023   HGBA1C 10.6 (H) 10/09/2023    Latest Reference Range & Units 10/11/23 15:43 10/11/23 20:15 10/12/23 00:07 10/12/23 04:42 10/12/23 06:38  Glucose-Capillary 70 - 99 mg/dL 610 (H) 619 (H) 589 (H) 377 (H) 372 (H)  (H): Data is abnormally high Review of Glycemic Control  Diabetes history: DM2 Outpatient Diabetes medications: 70/30 insulin  30-40 units when eating Current orders for Inpatient glycemic control: NPH 20 units BID, Novolog  0-9 units correction scale TID, Novolog  2 units TID with meals  Inpatient Diabetes Program Recommendations:   Noted that blood sugars have been greater than 300 mg/dl.  Recommend increasing NPH to 25 units BID, increase Novolog  to 0-15 units TID, add Novolog  0-5 units HS scale, and increase Novolog  MC to 4 units TID. Titrate dosages as needed.   Marjorie Lunger RN BSN CDE Diabetes Coordinator Pager: [phone]  8am-5pm

## 2023-10-12 NOTE — Progress Notes (Signed)
 Mobility Specialist Progress Note:    10/12/23 1037  Mobility  Activity Ambulated with assistance  Level of Assistance Contact guard assist, steadying assist  Assistive Device Front wheel walker  Distance Ambulated (ft) 500 ft  Activity Response Tolerated well  Mobility Referral Yes  Mobility visit 1 Mobility  Mobility Specialist Start Time (ACUTE ONLY) 1037  Mobility Specialist Stop Time (ACUTE ONLY) 1050  Mobility Specialist Time Calculation (min) (ACUTE ONLY) 13 min   Pt received in bed, agreeable to mobility session. Ambulated in hallway with CAM boot and RW. Tolerated well, asx throughout. All needs met. Dangling on EOB. Wife at bedside.    Dantonio Justen Mobility Specialist Please contact via Special educational needs teacher or  Rehab office at [phone]

## 2023-10-13 DIAGNOSIS — T148XXA Other injury of unspecified body region, initial encounter: Secondary | ICD-10-CM

## 2023-10-13 DIAGNOSIS — I10 Essential (primary) hypertension: Secondary | ICD-10-CM | POA: Diagnosis not present

## 2023-10-13 DIAGNOSIS — L089 Local infection of the skin and subcutaneous tissue, unspecified: Secondary | ICD-10-CM

## 2023-10-13 DIAGNOSIS — D869 Sarcoidosis, unspecified: Secondary | ICD-10-CM | POA: Diagnosis not present

## 2023-10-13 DIAGNOSIS — L03116 Cellulitis of left lower limb: Secondary | ICD-10-CM | POA: Diagnosis not present

## 2023-10-13 LAB — CBC WITH DIFFERENTIAL/PLATELET
Abs Granulocyte: 10.9 K/uL — ABNORMAL HIGH (ref 1.5–6.5)
Abs Immature Granulocytes: 0.81 K/uL — ABNORMAL HIGH (ref 0.00–0.07)
Basophils Absolute: 0.1 K/uL (ref 0.0–0.1)
Basophils Relative: 1 %
Eosinophils Absolute: 0.2 K/uL (ref 0.0–0.5)
Eosinophils Relative: 1 %
HCT: 40.5 % (ref 39.0–52.0)
Hemoglobin: 13.2 g/dL (ref 13.0–17.0)
Immature Granulocytes: 5 %
Lymphocytes Relative: 13 %
Lymphs Abs: 2 K/uL (ref 0.7–4.0)
MCH: 32.4 pg (ref 26.0–34.0)
MCHC: 32.6 g/dL (ref 30.0–36.0)
MCV: 99.3 fL (ref 80.0–100.0)
Monocytes Absolute: 1 K/uL (ref 0.1–1.0)
Monocytes Relative: 6 %
Neutro Abs: 10.9 K/uL — ABNORMAL HIGH (ref 1.7–7.7)
Neutrophils Relative %: 74 %
Platelets: 269 K/uL (ref 150–400)
RBC: 4.08 MIL/uL — ABNORMAL LOW (ref 4.22–5.81)
RDW: 12.9 % (ref 11.5–15.5)
WBC: 14.9 K/uL — ABNORMAL HIGH (ref 4.0–10.5)
nRBC: 0 % (ref 0.0–0.2)

## 2023-10-13 LAB — BASIC METABOLIC PANEL WITH GFR
Anion gap: 12 (ref 5–15)
BUN: 30 mg/dL — ABNORMAL HIGH (ref 8–23)
CO2: 24 mmol/L (ref 22–32)
Calcium: 8.7 mg/dL — ABNORMAL LOW (ref 8.9–10.3)
Chloride: 100 mmol/L (ref 98–111)
Creatinine, Ser: 1.36 mg/dL — ABNORMAL HIGH (ref 0.61–1.24)
GFR, Estimated: 54 mL/min — ABNORMAL LOW (ref >60)
Glucose, Bld: 278 mg/dL — ABNORMAL HIGH (ref 70–99)
Potassium: 4.4 mmol/L (ref 3.5–5.1)
Sodium: 136 mmol/L (ref 135–145)

## 2023-10-13 LAB — GLUCOSE, CAPILLARY
Glucose-Capillary: 221 mg/dL — ABNORMAL HIGH (ref 70–99)
Glucose-Capillary: 240 mg/dL — ABNORMAL HIGH (ref 70–99)
Glucose-Capillary: 265 mg/dL — ABNORMAL HIGH (ref 70–99)
Glucose-Capillary: 332 mg/dL — ABNORMAL HIGH (ref 70–99)

## 2023-10-13 MED ORDER — ASCORBIC ACID 500 MG PO TABS
500.0000 mg | ORAL_TABLET | Freq: Two times a day (BID) | ORAL | 2 refills | Status: AC
Start: 2023-10-13 — End: ?

## 2023-10-13 MED ORDER — LINEZOLID 600 MG PO TABS: 600.0000 mg | ORAL_TABLET | Freq: Two times a day (BID) | ORAL | 0 refills | Status: AC

## 2023-10-13 MED ORDER — EZETIMIBE 10 MG PO TABS: 10.0000 mg | ORAL_TABLET | Freq: Every morning | ORAL | Status: DC

## 2023-10-13 MED ORDER — ENSURE MAX PROTEIN PO LIQD: 11.0000 [oz_av] | Freq: Every day | ORAL | Status: DC

## 2023-10-13 MED ORDER — AMLODIPINE BESYLATE 5 MG PO TABS
5.0000 mg | ORAL_TABLET | Freq: Every day | ORAL | 2 refills | Status: DC
Start: 2023-10-14 — End: 2023-11-17

## 2023-10-13 MED ORDER — ZINC SULFATE 220 (50 ZN) MG PO CAPS: 220.0000 mg | ORAL_CAPSULE | Freq: Every day | ORAL | 2 refills | Status: DC

## 2023-10-13 MED ORDER — JUVEN PO PACK: 1.0000 | PACK | Freq: Two times a day (BID) | ORAL | 1 refills | Status: DC

## 2023-10-13 MED ORDER — AMOXICILLIN-POT CLAVULANATE 875-125 MG PO TABS: 1.0000 | ORAL_TABLET | Freq: Two times a day (BID) | ORAL | 0 refills | Status: AC

## 2023-10-13 MED ORDER — ADULT MULTIVITAMIN W/MINERALS CH: 1.0000 | ORAL_TABLET | Freq: Every day | ORAL | Status: DC

## 2023-10-13 NOTE — Progress Notes (Signed)
   10/13/23 1247  TOC Brief Assessment  Insurance and Status Reviewed  Patient has primary care physician Yes  Home environment has been reviewed home w/ spouse  Prior level of function: self has DME at home  Prior/Current Home Services No current home services  Social Drivers of Health Review SDOH reviewed no interventions necessary  Readmission risk has been reviewed Yes  Transition of care needs no transition of care needs at this time     Pt stable for transition home today with plan for OR on Monday at [facility]- scheduled. Pt has DME at home, will f/u for therapy needs after surgery.  No CM needs noted for discharge.

## 2023-10-13 NOTE — Progress Notes (Signed)
 AVS and education completed. Patient expressed verbal understanding of medication and discharge plan of care. Wife at bedside. PIV's and tele removed by Primary RN. Volunteer called for discharge to [employer].

## 2023-10-13 NOTE — Plan of Care (Signed)
  Problem: Education: Goal: Ability to describe self-care measures that may prevent or decrease complications (Diabetes Survival Skills Education) will improve Outcome: Progressing Goal: Individualized Educational Video(s) Outcome: Progressing   Problem: Coping: Goal: Ability to adjust to condition or change in health will improve Outcome: Progressing   Problem: Fluid Volume: Goal: Ability to maintain a balanced intake and output will improve Outcome: Progressing   Problem: Health Behavior/Discharge Planning: Goal: Ability to identify and utilize available resources and services will improve Outcome: Progressing Goal: Ability to manage health-related needs will improve Outcome: Progressing   Problem: Metabolic: Goal: Ability to maintain appropriate glucose levels will improve Outcome: Progressing   Problem: Nutritional: Goal: Maintenance of adequate nutrition will improve Outcome: Progressing Goal: Progress toward achieving an optimal weight will improve Outcome: Progressing   Problem: Skin Integrity: Goal: Risk for impaired skin integrity will decrease Outcome: Progressing   Problem: Tissue Perfusion: Goal: Adequacy of tissue perfusion will improve Outcome: Progressing   Problem: Education: Goal: Knowledge of General Education information will improve Description: Including pain rating scale, medication(s)/side effects and non-pharmacologic comfort measures Outcome: Progressing   Problem: Health Behavior/Discharge Planning: Goal: Ability to manage health-related needs will improve Outcome: Progressing   Problem: Clinical Measurements: Goal: Ability to maintain clinical measurements within normal limits will improve Outcome: Progressing Goal: Will remain free from infection Outcome: Progressing Goal: Diagnostic test results will improve Outcome: Progressing Goal: Cardiovascular complication will be avoided Outcome: Progressing   Problem: Activity: Goal: Risk  for activity intolerance will decrease Outcome: Progressing   Problem: Nutrition: Goal: Adequate nutrition will be maintained Outcome: Progressing   Problem: Coping: Goal: Level of anxiety will decrease Outcome: Progressing   Problem: Elimination: Goal: Will not experience complications related to bowel motility Outcome: Progressing Goal: Will not experience complications related to urinary retention Outcome: Progressing   Problem: Pain Managment: Goal: General experience of comfort will improve and/or be controlled Outcome: Progressing   Problem: Safety: Goal: Ability to remain free from injury will improve Outcome: Progressing   Problem: Skin Integrity: Goal: Risk for impaired skin integrity will decrease Outcome: Progressing

## 2023-10-13 NOTE — Discharge Summary (Signed)
 Physician Discharge Summary   Patient: [name] MRN: [id number] DOB: August 29, 1945  Admit date:     10/09/2023  Discharge date: 10/13/23  Discharge Physician: Elgie Butter   PCP: Arloa Elsie SAUNDERS, MD   Recommendations at discharge:  Please follow up with Surgeon on Monday for Incision and Drainage.  Please follow up with CBC and BMP on Monday.  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 male with medical history significant of DM2 , adrenal insufficiency, CKD stage III, CAD status post stents, HLD HTN OSA PVD sarcoidosis OSA hypothyroidism, Charcot right foot , presents with cellulitis of the right foot.   Assessment and Plan:    Left foot infection Patient currently on IV antibiotics, transition to oral antibiotics on discharge.  Vascular surgery and podiatry consulted and have signed off as patient wishes to see his surgeon at [facility]. Pain controlled with Neurontin  Afebrile, with persistent leukocytosis. Discussed with the patient that his wbc count is increasing, He is scheduled for OR on Monday at [facility].  Will discharge him on Oral linezolid  and Augmentin  for another 5 days.      Hypertension.  BP parameters are optimal.  Continue with metoprolol  , norvasc  added along with hydralazine  prn.        Type 2 diabetes mellitus with hyperglycemia CBG (last 3)  Recent Labs    10/13/23 0026  10/13/23 0351 10/13/23 0825  GLUCAP 332* 265* 221*   Resume home meds.    Hypothyroidism Continue with Synthroid  75 mcg daily.     Stage IIIa CKD Looks like baseline creatinine ranges between 1.2 to 1.5.  Currently at 1.36 and stable.      Body mass index is 28.71 kg/m. Obesity class I       Hypokalemia,  Replaced. Repeat levels wnl.      Pulmonary Sarcoidosis Resume prednisone .    Hyponatremia Resolved     Hypomagnesemia Replaced   Consultants: vascular surgery. Podiatry. Procedures performed: none.   Disposition: Home Diet recommendation:  Discharge Diet Orders (From admission, onward)     Start     Ordered   10/13/23 0000  Diet - low sodium heart healthy        10/13/23 1202           Carb modified diet DISCHARGE MEDICATION: Allergies as of 10/13/2023       Reactions   Statins Other (See Comments)   Aggression  Pt ok to take crestor  20mg  once a week   Hydrocortisone  Nausea Only   Ranexa [ranolazine] Other (See Comments)   Severe weakness/near syncope after 1 dose Hallucinations    Doxycycline  Diarrhea   Hydrocodone  Nausea And Vomiting   Miralax  [polyethylene Glycol] Nausea And Vomiting   Z-pak [azithromycin ] Other (See Comments)   Raises blood sugar   Zaroxolyn  Chale.Chamberlain ] Other (See Comments)   Drained, no energy        Medication  List     PAUSE taking these medications    clopidogrel  75 MG tablet Wait to take this until your doctor or other care provider tells you to start again. Commonly known as: PLAVIX  Take 1 tablet (75 mg total) by mouth daily. Restart on 09/28/21 What changed: when to take this       TAKE these medications    acetaminophen  500 MG tablet Commonly known as: TYLENOL  Take 1,000 mg by mouth daily as needed for mild pain (pain).   albuterol  (2.5 MG/3ML) 0.083% nebulizer solution Commonly known as: PROVENTIL  Take 3 mLs (2.5 mg total) by nebulization every 6 (six) hours as needed for wheezing or  shortness of breath. Dx Code D86.0   amLODipine  5 MG tablet Commonly known as: NORVASC  Take 1 tablet (5 mg total) by mouth daily. Start taking on: October 14, 2023   amoxicillin -clavulanate 875-125 MG tablet Commonly known as: AUGMENTIN  Take 1 tablet by mouth 2 (two) times daily for 5 days.   ascorbic acid  500 MG tablet Commonly known as: VITAMIN C  Take 1 tablet (500 mg total) by mouth 2 (two) times daily.   ezetimibe  10 MG tablet Commonly known as: ZETIA  Take 1 tablet (10 mg total) by mouth in the morning.   gabapentin  100 MG capsule Commonly known as: NEURONTIN  Take 100 mg by mouth as needed.   levothyroxine  75 MCG tablet Commonly known as: SYNTHROID  Take 75 mcg by mouth in the morning.   linezolid  600 MG tablet Commonly known as: ZYVOX  Take 1 tablet (600 mg total) by mouth every 12 (twelve) hours for 5 days.   metoprolol  tartrate 25 MG tablet Commonly known as: LOPRESSOR  TAKE 0.5 TABLETS BY MOUTH 2 TIMES DAILY. What changed: See the new instructions.   montelukast  10 MG tablet Commonly known as: SINGULAIR  Take 10 mg by mouth daily as needed (allergies).   multivitamin with minerals Tabs tablet Take 1 tablet by mouth daily. Start taking on: October 14, 2023   NovoLIN 70/30 (70-30) 100 UNIT/ML injection Generic drug: insulin  NPH-regular Human Inject 30-40 Units into the skin.   nutrition supplement (JUVEN) Pack Take 1 packet by mouth 2 (two) times daily between meals.   Ensure Max Protein Liqd Take 330 mLs (11 oz total) by mouth at bedtime.   pantoprazole  40 MG tablet Commonly known as: PROTONIX  Take 40 mg by mouth daily.   potassium chloride  10 MEQ CR capsule Commonly known as: MICRO-K  Take 20 mEq by mouth daily.   predniSONE  10 MG tablet Commonly known as: DELTASONE  Take 10 mg by mouth in the morning. Continuous course.   rosuvastatin  20 MG tablet Commonly known as: CRESTOR  TAKE 1 TABLET AT BEDTIME What changed:  when to take this additional  instructions   zinc  sulfate (50mg  elemental zinc ) 220 (50 Zn) MG capsule Take 1 capsule (220 mg total) by mouth daily. Start taking on: October 14, 2023               Discharge Care Instructions  (From admission, onward)           Start     Ordered   10/13/23 0000  Discharge wound care:       Comments: Cleanse L plantar foot wound with Betadine, using a Q tip applicator insert a small piece of Betadine soaked gauze to wound bed daily, cover with dry gauze and secure with silicone foam or Kerlix roll gauze whichever is preferred   10/13/23 1202  Discharge Exam: Filed Weights   10/09/23 1239 10/09/23 2322  Weight: 93 kg 90.8 kg   General exam: Appears calm and comfortable  Respiratory system: Clear to auscultation. Respiratory effort normal. Cardiovascular system: S1 & S2 heard, RRR. No JVD, Gastrointestinal system: Abdomen is nondistended, soft and nontender. Central nervous system: Alert and oriented.  Extremities:  left foot wound bandaged.  Skin: left foot wound bandaged.  Psychiatry: Mood & affect appropriate.    Condition at discharge: fair  The results of significant diagnostics from this hospitalization (including imaging, microbiology, ancillary and laboratory) are listed below for reference.   Imaging Studies: VAS US  ABI WITH/WO TBI Result Date: 10/10/2023  LOWER EXTREMITY DOPPLER STUDY Patient Name:  SLY PARLEE  Date of Exam:   10/10/2023 Medical Rec #: [id number]   Accession #:    [id number] Date of Birth: Mar 02, 1946   Patient Gender: M Patient Age:   16 years Exam Location:  [facility] Procedure:      VAS US  ABI WITH/WO TBI Referring Phys: BLEASE DOUTOVA --------------------------------------------------------------------------------  Indications: Ulceration. High Risk Factors: Hypertension, hyperlipidemia, Diabetes, no history of                    smoking, coronary artery disease. Other Factors: CHF, CKD,.  Comparison Study: Previous  exam 04/26/2022 Performing Technologist: Leigh Rom RVT, RDMS  Examination Guidelines: A complete evaluation includes at minimum, Doppler waveform signals and systolic blood pressure reading at the level of bilateral brachial, anterior tibial, and posterior tibial arteries, when vessel segments are accessible. Bilateral testing is considered an integral part of a complete examination. Photoelectric Plethysmograph (PPG) waveforms and toe systolic pressure readings are included as required and additional duplex testing as needed. Limited examinations for reoccurring indications may be performed as noted.  ABI Findings: +---------+------------------+-----+-----------+--------+ Right    Rt Pressure (mmHg)IndexWaveform   Comment  +---------+------------------+-----+-----------+--------+ Brachial 130                    triphasic           +---------+------------------+-----+-----------+--------+ PTA                             multiphasic>255     +---------+------------------+-----+-----------+--------+ DP                              multiphasic>255     +---------+------------------+-----+-----------+--------+ Great Toe69                0.53 Abnormal            +---------+------------------+-----+-----------+--------+ +---------+------------------+-----+----------+-------+ Left     Lt Pressure (mmHg)IndexWaveform  Comment +---------+------------------+-----+----------+-------+ Brachial 131                    triphasic         +---------+------------------+-----+----------+-------+ PTA                             monophasic>255    +---------+------------------+-----+----------+-------+ DP                              monophasic>255    +---------+------------------+-----+----------+-------+ Great Toe41                0.31 Abnormal          +---------+------------------+-----+----------+-------+ +-------+-----------+-----------+------------+------------+  ABI/TBIToday's ABIToday's TBIPrevious ABIPrevious TBI +-------+-----------+-----------+------------+------------+ Right  [place]         0.53       [place]          0.46         +-------+-----------+-----------+------------+------------+ Left   [place]         0.31       [place]          0.50         +-------+-----------+-----------+------------+------------+  Left TBIs appear decreased.  Summary: Right: Resting right ankle-brachial index indicates noncompressible right lower extremity arteries. The right toe-brachial index is abnormal. Left: Resting left ankle-brachial index indicates noncompressible left lower extremity arteries. The left toe-brachial index is abnormal. *See table(s) above for measurements and observations.  Electronically signed by Penne Colorado MD on 10/10/2023 at 4:42:02 PM.    Final    MR FOOT LEFT WO CONTRAST Result Date: 10/10/2023 CLINICAL DATA:  diabetic foot ulcer, cellulitis of LLE EXAM: MRI OF THE LEFT FOOT WITHOUT CONTRAST TECHNIQUE: Multiplanar, multisequence MR imaging of the left foot was performed. No intravenous contrast was administered. COMPARISON:  Left foot radiographs dated 10/09/2023. MRI of the left foot dated 01/20/2020. FINDINGS: Bones/Joint/Cartilage Focal soft tissue wound at the lateral plantar midfoot. There is increased T2/STIR hyperintense edema like signal of the plantar margin of the underlying cuboid bone with mild cortical irregularity. No significant corresponding T1 hypointense marrow signal abnormality. These findings are concerning for mild or early acute osteomyelitis versus reactive marrow changes. No convincing marrow signal abnormality identified elsewhere to suggest acute osteomyelitis. Chronic changes of Charcot arthropathy of the midfoot with midfoot collapse. Prior first MTP joint arthrodesis with associated susceptibility artifact. No evidence of acute fracture or dislocation. No joint effusion. Ligaments Chronic Charcot changes at the level of the  Lisfranc interval. No evidence of acute ligamentous injury. Muscles and Tendons Diffuse atrophy of the intrinsic musculature of the foot, most compatible with chronic denervation changes. No tenosynovitis. Soft tissue Focal soft tissue wound at the plantar lateral midfoot with soft tissue edema and more patchy areas of T2 hyperintense signal which could reflect phlegmonous change. No drainable abscess. Subcutaneous edema of the visualized foot. No soft tissue mass. IMPRESSION: 1. Focal soft tissue wound at the plantar lateral midfoot with cellulitis and possible underlying phlegmonous change. No drainable abscess. 2. Marrow edema of the plantar margin of the underlying cuboid bone with mild cortical irregularity. No significant corresponding T1 hypointense marrow signal abnormality. These findings are concerning for mild or early acute osteomyelitis versus reactive marrow changes. 3. Chronic changes of Charcot arthropathy of the midfoot with midfoot collapse. Electronically Signed   By: Harrietta Sherry M.D.   On: 10/10/2023 09:48   DG CHEST PORT 1 VIEW Result Date: 10/09/2023 CLINICAL DATA:  Preop. EXAM: PORTABLE CHEST 1 VIEW COMPARISON:  08/01/2022 and CT chest 08/02/2022. FINDINGS: Trachea is midline. Heart is enlarged, stable. Linear scarring in the left upper lobe. Mild basilar atelectasis or scarring. No pleural fluid. IMPRESSION: Mild bibasilar atelectasis or scarring. Electronically Signed   By: Newell Eke M.D.   On: 10/09/2023 21:35   DG Foot Complete Left Result Date: 10/09/2023 CLINICAL DATA:  Wound infection with worsening pain. EXAM: LEFT FOOT - COMPLETE 3+ VIEW COMPARISON:  None Available. FINDINGS: Soft tissue regularity and thickening along the plantar aspect of the mid and forefoot with forefoot soft tissue swelling. Difficult to exclude a tiny erosion along the medial base of the first metatarsal. Charcot foot changes. Two screws  traverse a fused first metatarsophalangeal joint. Plantar  calcaneal spur. IMPRESSION: 1. Soft tissue wound along the plantar midfoot with diffuse forefoot soft tissue swelling. Possible osseous erosion along the medial base of the first metatarsal. Osteomyelitis cannot be excluded. 2. Charcot foot. Electronically Signed   By: Newell Eke M.D.   On: 10/09/2023 14:25    Microbiology: Results for orders placed or performed during the hospital encounter of 10/09/23  Blood culture (routine x 2)     Status: None (Preliminary result)   Collection Time: 10/09/23  1:14 PM   Specimen: BLOOD  Result Value Ref Range Status   Specimen Description   Final    BLOOD BLOOD RIGHT FOREARM Performed at [facility], [address] [place], [place] [address]    Special Requests   Final    BOTTLES DRAWN AEROBIC ONLY Blood Culture results may not be optimal due to an inadequate volume of blood received in culture bottles Performed at [facility], [address], [place], [place] [address]    Culture   Final    NO GROWTH 4 DAYS Performed at [facility], [address], [place], [place] [address]    Report Status PENDING  Incomplete  Blood culture (routine x 2)     Status: None (Preliminary result)   Collection Time: 10/09/23  2:30 PM   Specimen: BLOOD LEFT ARM  Result Value Ref Range Status   Specimen Description   Final    BLOOD LEFT ARM Performed at [facility], [address] [place], [place] [address]    Special Requests   Final    BOTTLES DRAWN AEROBIC AND ANAEROBIC Blood Culture results may not be optimal due to an inadequate volume of blood received in culture bottles Performed at [facility], [address], [place], [place] [address]    Culture   Final    NO GROWTH 4 DAYS Performed at [facility], [address] [place], [place] [address]    Report Status PENDING  Incomplete  MRSA Next Gen by PCR, Nasal     Status: None   Collection Time: 10/09/23  8:25 PM    Specimen: Nasal Mucosa; Nasal Swab  Result Value Ref Range Status   MRSA by PCR Next Gen NOT DETECTED NOT DETECTED Final    Comment: (NOTE) The GeneXpert MRSA Assay (FDA approved for NASAL specimens only), is one component of a comprehensive MRSA colonization surveillance program. It is not intended to diagnose MRSA infection nor to guide or monitor treatment for MRSA infections. Test performance is not FDA approved in patients less than 51 years old. Performed at [facility], [address] [place], [place] [address]     Labs: CBC: Recent Labs  Lab 10/09/23 1852 10/10/23 0903 10/11/23 0321 10/12/23 0318 10/13/23 0328  WBC 13.3* 11.2* 11.0* 12.5* 14.9*  NEUTROABS 10.5*  --  8.4* 9.2* 10.9*  HGB 12.9* 12.7* 13.4 13.1 13.2  HCT 39.7 37.4* 40.2 39.2 40.5  MCV 98.3 97.1 98.3 99.0 99.3  PLT 192 201 238 260 269   Basic Metabolic Panel: Recent Labs  Lab 10/09/23 2025 10/10/23 0903 10/10/23 1438 10/11/23 0321 10/11/23 0734 10/12/23 0318 10/13/23 0328  NA 132* 136 131* 137  --  135 136  K 2.8* 3.3* 3.8 3.2*  --  4.6 4.4  CL 95* 102 98 102  --  100 100  CO2 26 25 25 26   --  23  24  GLUCOSE 395* 191* 378* 117*  --  380* 278*  BUN 24* 16 16 17   --  27* 30*  CREATININE 1.36* 1.06 1.19 1.45*  --  1.48* 1.36*  CALCIUM  8.2* 8.2* 8.3* 8.6*  --  8.6* 8.7*  MG 1.8 1.6*  --   --  1.8  --   --   PHOS 1.5* 2.1*  --   --  2.0*  --   --    Liver Function Tests: Recent Labs  Lab 10/09/23 1314 10/10/23 0903  AST 23 24  ALT 15 17  ALKPHOS 54 49  BILITOT 1.2 1.1  PROT 6.1* 5.7*  ALBUMIN 2.9* 2.4*   CBG: Recent Labs  Lab 10/12/23 1702 10/12/23 2102 10/13/23 0026 10/13/23 0351 10/13/23 0825  GLUCAP 285* 433* 332* 265* 221*    Discharge time spent: 41 minutes.   Signed: Elgie Butter, MD [facility] 10/13/2023

## 2023-10-14 LAB — CULTURE, BLOOD (ROUTINE X 2)
Culture: NO GROWTH
Culture: NO GROWTH

## 2023-10-15 DIAGNOSIS — E876 Hypokalemia: Secondary | ICD-10-CM | POA: Diagnosis not present

## 2023-10-15 DIAGNOSIS — I2510 Atherosclerotic heart disease of native coronary artery without angina pectoris: Secondary | ICD-10-CM | POA: Diagnosis not present

## 2023-10-15 DIAGNOSIS — E669 Obesity, unspecified: Secondary | ICD-10-CM | POA: Diagnosis not present

## 2023-10-15 DIAGNOSIS — L97528 Non-pressure chronic ulcer of other part of left foot with other specified severity: Secondary | ICD-10-CM | POA: Diagnosis not present

## 2023-10-15 DIAGNOSIS — E11610 Type 2 diabetes mellitus with diabetic neuropathic arthropathy: Secondary | ICD-10-CM | POA: Diagnosis not present

## 2023-10-15 DIAGNOSIS — L089 Local infection of the skin and subcutaneous tissue, unspecified: Secondary | ICD-10-CM | POA: Diagnosis not present

## 2023-10-15 DIAGNOSIS — I080 Rheumatic disorders of both mitral and aortic valves: Secondary | ICD-10-CM | POA: Diagnosis not present

## 2023-10-15 DIAGNOSIS — L03116 Cellulitis of left lower limb: Secondary | ICD-10-CM | POA: Diagnosis not present

## 2023-10-15 DIAGNOSIS — I509 Heart failure, unspecified: Secondary | ICD-10-CM | POA: Diagnosis not present

## 2023-10-15 DIAGNOSIS — I70202 Unspecified atherosclerosis of native arteries of extremities, left leg: Secondary | ICD-10-CM | POA: Diagnosis not present

## 2023-10-15 DIAGNOSIS — E039 Hypothyroidism, unspecified: Secondary | ICD-10-CM | POA: Diagnosis not present

## 2023-10-15 DIAGNOSIS — E119 Type 2 diabetes mellitus without complications: Secondary | ICD-10-CM | POA: Diagnosis not present

## 2023-10-15 DIAGNOSIS — I130 Hypertensive heart and chronic kidney disease with heart failure and stage 1 through stage 4 chronic kidney disease, or unspecified chronic kidney disease: Secondary | ICD-10-CM | POA: Diagnosis not present

## 2023-10-15 DIAGNOSIS — E11628 Type 2 diabetes mellitus with other skin complications: Secondary | ICD-10-CM | POA: Diagnosis not present

## 2023-10-15 DIAGNOSIS — E1165 Type 2 diabetes mellitus with hyperglycemia: Secondary | ICD-10-CM | POA: Diagnosis not present

## 2023-10-15 DIAGNOSIS — L02619 Cutaneous abscess of unspecified foot: Secondary | ICD-10-CM | POA: Diagnosis not present

## 2023-10-15 DIAGNOSIS — E1122 Type 2 diabetes mellitus with diabetic chronic kidney disease: Secondary | ICD-10-CM | POA: Diagnosis not present

## 2023-10-15 DIAGNOSIS — E1169 Type 2 diabetes mellitus with other specified complication: Secondary | ICD-10-CM | POA: Diagnosis not present

## 2023-10-15 DIAGNOSIS — G4733 Obstructive sleep apnea (adult) (pediatric): Secondary | ICD-10-CM | POA: Diagnosis not present

## 2023-10-15 DIAGNOSIS — M86172 Other acute osteomyelitis, left ankle and foot: Secondary | ICD-10-CM | POA: Diagnosis not present

## 2023-10-15 DIAGNOSIS — N189 Chronic kidney disease, unspecified: Secondary | ICD-10-CM | POA: Diagnosis not present

## 2023-10-15 DIAGNOSIS — L03119 Cellulitis of unspecified part of limb: Secondary | ICD-10-CM | POA: Diagnosis not present

## 2023-10-15 DIAGNOSIS — I739 Peripheral vascular disease, unspecified: Secondary | ICD-10-CM | POA: Diagnosis not present

## 2023-10-15 DIAGNOSIS — L97529 Non-pressure chronic ulcer of other part of left foot with unspecified severity: Secondary | ICD-10-CM | POA: Diagnosis not present

## 2023-10-15 DIAGNOSIS — R079 Chest pain, unspecified: Secondary | ICD-10-CM | POA: Diagnosis not present

## 2023-10-15 DIAGNOSIS — L02612 Cutaneous abscess of left foot: Secondary | ICD-10-CM | POA: Diagnosis not present

## 2023-10-15 DIAGNOSIS — Z794 Long term (current) use of insulin: Secondary | ICD-10-CM | POA: Diagnosis not present

## 2023-10-15 DIAGNOSIS — D860 Sarcoidosis of lung: Secondary | ICD-10-CM | POA: Diagnosis not present

## 2023-10-15 DIAGNOSIS — E1151 Type 2 diabetes mellitus with diabetic peripheral angiopathy without gangrene: Secondary | ICD-10-CM | POA: Diagnosis not present

## 2023-10-15 DIAGNOSIS — E2740 Unspecified adrenocortical insufficiency: Secondary | ICD-10-CM | POA: Diagnosis not present

## 2023-10-15 DIAGNOSIS — I10 Essential (primary) hypertension: Secondary | ICD-10-CM | POA: Diagnosis not present

## 2023-10-15 DIAGNOSIS — E11621 Type 2 diabetes mellitus with foot ulcer: Secondary | ICD-10-CM | POA: Diagnosis not present

## 2023-10-23 DIAGNOSIS — E11610 Type 2 diabetes mellitus with diabetic neuropathic arthropathy: Secondary | ICD-10-CM | POA: Diagnosis not present

## 2023-10-25 DIAGNOSIS — E1151 Type 2 diabetes mellitus with diabetic peripheral angiopathy without gangrene: Secondary | ICD-10-CM | POA: Diagnosis not present

## 2023-10-25 DIAGNOSIS — L02612 Cutaneous abscess of left foot: Secondary | ICD-10-CM | POA: Diagnosis not present

## 2023-10-25 DIAGNOSIS — L03116 Cellulitis of left lower limb: Secondary | ICD-10-CM | POA: Diagnosis not present

## 2023-10-25 DIAGNOSIS — E1165 Type 2 diabetes mellitus with hyperglycemia: Secondary | ICD-10-CM | POA: Diagnosis not present

## 2023-10-25 DIAGNOSIS — I509 Heart failure, unspecified: Secondary | ICD-10-CM | POA: Diagnosis not present

## 2023-10-25 DIAGNOSIS — N1831 Chronic kidney disease, stage 3a: Secondary | ICD-10-CM | POA: Diagnosis not present

## 2023-10-25 DIAGNOSIS — E1122 Type 2 diabetes mellitus with diabetic chronic kidney disease: Secondary | ICD-10-CM | POA: Diagnosis not present

## 2023-10-25 DIAGNOSIS — E11610 Type 2 diabetes mellitus with diabetic neuropathic arthropathy: Secondary | ICD-10-CM | POA: Diagnosis not present

## 2023-10-25 DIAGNOSIS — I130 Hypertensive heart and chronic kidney disease with heart failure and stage 1 through stage 4 chronic kidney disease, or unspecified chronic kidney disease: Secondary | ICD-10-CM | POA: Diagnosis not present

## 2023-11-01 DIAGNOSIS — S90822D Blister (nonthermal), left foot, subsequent encounter: Secondary | ICD-10-CM | POA: Diagnosis not present

## 2023-11-02 DIAGNOSIS — I509 Heart failure, unspecified: Secondary | ICD-10-CM | POA: Diagnosis not present

## 2023-11-02 DIAGNOSIS — E11610 Type 2 diabetes mellitus with diabetic neuropathic arthropathy: Secondary | ICD-10-CM | POA: Diagnosis not present

## 2023-11-02 DIAGNOSIS — E1151 Type 2 diabetes mellitus with diabetic peripheral angiopathy without gangrene: Secondary | ICD-10-CM | POA: Diagnosis not present

## 2023-11-02 DIAGNOSIS — I130 Hypertensive heart and chronic kidney disease with heart failure and stage 1 through stage 4 chronic kidney disease, or unspecified chronic kidney disease: Secondary | ICD-10-CM | POA: Diagnosis not present

## 2023-11-02 DIAGNOSIS — E1165 Type 2 diabetes mellitus with hyperglycemia: Secondary | ICD-10-CM | POA: Diagnosis not present

## 2023-11-02 DIAGNOSIS — E1122 Type 2 diabetes mellitus with diabetic chronic kidney disease: Secondary | ICD-10-CM | POA: Diagnosis not present

## 2023-11-02 DIAGNOSIS — L03116 Cellulitis of left lower limb: Secondary | ICD-10-CM | POA: Diagnosis not present

## 2023-11-02 DIAGNOSIS — L02612 Cutaneous abscess of left foot: Secondary | ICD-10-CM | POA: Diagnosis not present

## 2023-11-02 DIAGNOSIS — N1831 Chronic kidney disease, stage 3a: Secondary | ICD-10-CM | POA: Diagnosis not present

## 2023-11-04 NOTE — Progress Notes (Signed)
 [facility] Subjective:  Patient ID: Craig Jama Hoard Sr. is a 78 y.o. (DOB Mar 14, 1945) male      Patient presents with  . Follow-up    s/p  LEFT FOOT WOUND DEBRIDEMENT, cuboid plaining, bone biopsy culture 10/18/23. He is nwb and in a cam boot, now has a new spot on his left heel       History of Present Illness The patient presents for evaluation of a heel spot. He has been managing it with a dressing, avoiding weight-bearing, and maintaining cleanliness without antibiotics.   Reviewed and updated this visit by provider: Tobacco  Allergies  Meds  Problems  Med Hx  Surg Hx  Fam Hx       Objective:   Vitals:   11/01/23 1146  PainSc:   6  PainLoc: Foot   Physical Exam   Physical Exam Patient is alert, oriented, and in no apparent distress. Mood is appropriate. Appearance is appropriate. Heart: RRR. Lungs: regular, non-labored. Lower Extremity: Small rubbed spot on heel, blisters present. Sutures intact.        Results     Assessment / Plan:  Assessment 1. Surgical aftercare, musculoskeletal system (Primary)   Assessment & Plan 1. Heel ulceration: Recommend transitioning to a different boot to alleviate heel pressure. Stitches will remain for an extended period. - Wear the new boot at all times except during sleep. - Standing in the boot is allowed, but walking is not recommended. - Clean the area with antibacterial soap once daily. - Apply prescribed cream (SioxC) twice daily. - Wash socks in hot water with Clorox and dry separately.  Follow-up - Follow-up with x-ray in one month.   Follow up for 1 week po 11/08/23 at 915am. Risks, benefits, and alternatives of the medications and treatment plan prescribed today were discussed, and patient expressed understanding. Plan follow-up as discussed or as needed if any worsening symptoms or change in condition.    I have reviewed the information contained in this note and personally verified its  accuracy.  I obtained or reviewed the history of present illness and personally performed the physical exam, and all in office imagining including xray and or ultrasound with documentation of findings in the above note.  [name], DPM  11/04/2023 9:48 PM   Computer technology was used to create visit note. Consent from the patient/caregiver was obtained prior to its use.

## 2023-11-08 NOTE — Progress Notes (Signed)
 [facility] Subjective:  Patient ID: Craig Jama Hoard Sr. is a 78 y.o. (DOB 1945-12-31) male      Patient presents with  . Follow-up     s/p  LEFT FOOT WOUND DEBRIDEMENT, cuboid plaining, bone biopsy culture 10/18/23. He is NWB and wearing a podus boot, sutures in tact         History of Present Illness The patient presents for evaluation of her foot.  She reports increased swelling compared to the previous day. She has been applying cream to the side of her foot but not the heel. She cleaned the area and noticed a protrusion, which she pressed down without topical treatment. She was unable to feel a bump on the bottom of her foot after rubbing it.   Reviewed and updated this visit by provider: Tobacco  Allergies  Meds  Problems  Med Hx  Surg Hx  Fam Hx       Objective:   Vitals:   11/08/23 0905  PainSc:   3  PainLoc: Foot   Physical Exam   Physical Exam Patient is alert, oriented, and in no apparent distress. Mood is appropriate. Appearance is appropriate. Heart: RRR. Lungs: regular, non-labored. Lower Extremity: Foot: Sutures in place.    Results     Assessment / Plan:  Assessment 1. Surgical aftercare, musculoskeletal system (Primary) 2. Diabetic autonomic neuropathy associated with type 2 diabetes mellitus (*) 3. Charcot foot due to diabetes mellitus (*)   Assessment & Plan 1. Post-surgical foot care: Healing progressing well. - Apply prescribed cream (SioxC) twice daily, covering all areas except the incision site. - Wear clean cotton socks. - Continue using the boot for support.   No follow-ups on file. Risks, benefits, and alternatives of the medications and treatment plan prescribed today were discussed, and patient expressed understanding. Plan follow-up as discussed or as needed if any worsening symptoms or change in condition.    I have reviewed the information contained in this note and personally verified its accuracy.  I obtained  or reviewed the history of present illness and personally performed the physical exam, and all in office imagining including xray and or ultrasound with documentation of findings in the above note.  [name], DPM  11/11/2023 3:16 PM   Computer technology was used to create visit note. Consent from the patient/caregiver was obtained prior to its use.

## 2023-11-08 NOTE — Progress Notes (Signed)
 Siox-c cream dispensed to patient Lot # [id number] Exp date: 07/26/24

## 2023-11-09 ENCOUNTER — Other Ambulatory Visit: Payer: Self-pay

## 2023-11-09 ENCOUNTER — Encounter (HOSPITAL_COMMUNITY): Payer: Self-pay | Admitting: *Deleted

## 2023-11-09 ENCOUNTER — Emergency Department (HOSPITAL_COMMUNITY)
Admission: EM | Admit: 2023-11-09 | Discharge: 2023-11-10 | Disposition: A | Discharge status: Other Acute Inpatient Hospital | Attending: Emergency Medicine | Admitting: Emergency Medicine

## 2023-11-09 ENCOUNTER — Emergency Department (HOSPITAL_COMMUNITY)

## 2023-11-09 DIAGNOSIS — I130 Hypertensive heart and chronic kidney disease with heart failure and stage 1 through stage 4 chronic kidney disease, or unspecified chronic kidney disease: Secondary | ICD-10-CM | POA: Diagnosis not present

## 2023-11-09 DIAGNOSIS — L97424 Non-pressure chronic ulcer of left heel and midfoot with necrosis of bone: Secondary | ICD-10-CM | POA: Diagnosis not present

## 2023-11-09 DIAGNOSIS — T50901A Poisoning by unspecified drugs, medicaments and biological substances, accidental (unintentional), initial encounter: Secondary | ICD-10-CM | POA: Diagnosis not present

## 2023-11-09 DIAGNOSIS — Z7902 Long term (current) use of antithrombotics/antiplatelets: Secondary | ICD-10-CM | POA: Diagnosis not present

## 2023-11-09 DIAGNOSIS — X58XXXA Exposure to other specified factors, initial encounter: Secondary | ICD-10-CM | POA: Insufficient documentation

## 2023-11-09 DIAGNOSIS — D869 Sarcoidosis, unspecified: Secondary | ICD-10-CM | POA: Diagnosis not present

## 2023-11-09 DIAGNOSIS — R000 Tachycardia, unspecified: Secondary | ICD-10-CM | POA: Diagnosis not present

## 2023-11-09 DIAGNOSIS — I4891 Unspecified atrial fibrillation: Secondary | ICD-10-CM | POA: Diagnosis not present

## 2023-11-09 DIAGNOSIS — E11621 Type 2 diabetes mellitus with foot ulcer: Secondary | ICD-10-CM | POA: Diagnosis not present

## 2023-11-09 DIAGNOSIS — I739 Peripheral vascular disease, unspecified: Secondary | ICD-10-CM | POA: Diagnosis not present

## 2023-11-09 DIAGNOSIS — T1491XA Suicide attempt, initial encounter: Secondary | ICD-10-CM | POA: Diagnosis not present

## 2023-11-09 DIAGNOSIS — E1122 Type 2 diabetes mellitus with diabetic chronic kidney disease: Secondary | ICD-10-CM | POA: Diagnosis not present

## 2023-11-09 DIAGNOSIS — F4321 Adjustment disorder with depressed mood: Secondary | ICD-10-CM | POA: Diagnosis not present

## 2023-11-09 DIAGNOSIS — Z794 Long term (current) use of insulin: Secondary | ICD-10-CM | POA: Diagnosis not present

## 2023-11-09 DIAGNOSIS — T40422A Poisoning by tramadol, intentional self-harm, initial encounter: Secondary | ICD-10-CM | POA: Insufficient documentation

## 2023-11-09 DIAGNOSIS — D860 Sarcoidosis of lung: Secondary | ICD-10-CM | POA: Insufficient documentation

## 2023-11-09 DIAGNOSIS — R918 Other nonspecific abnormal finding of lung field: Secondary | ICD-10-CM | POA: Diagnosis not present

## 2023-11-09 DIAGNOSIS — I4510 Unspecified right bundle-branch block: Secondary | ICD-10-CM | POA: Diagnosis not present

## 2023-11-09 DIAGNOSIS — I5032 Chronic diastolic (congestive) heart failure: Secondary | ICD-10-CM | POA: Insufficient documentation

## 2023-11-09 DIAGNOSIS — N189 Chronic kidney disease, unspecified: Secondary | ICD-10-CM | POA: Insufficient documentation

## 2023-11-09 DIAGNOSIS — R001 Bradycardia, unspecified: Secondary | ICD-10-CM | POA: Diagnosis not present

## 2023-11-09 DIAGNOSIS — N1831 Chronic kidney disease, stage 3a: Secondary | ICD-10-CM | POA: Insufficient documentation

## 2023-11-09 DIAGNOSIS — E785 Hyperlipidemia, unspecified: Secondary | ICD-10-CM | POA: Insufficient documentation

## 2023-11-09 DIAGNOSIS — G4733 Obstructive sleep apnea (adult) (pediatric): Secondary | ICD-10-CM | POA: Diagnosis not present

## 2023-11-09 DIAGNOSIS — I2510 Atherosclerotic heart disease of native coronary artery without angina pectoris: Secondary | ICD-10-CM | POA: Diagnosis not present

## 2023-11-09 DIAGNOSIS — I517 Cardiomegaly: Secondary | ICD-10-CM | POA: Diagnosis not present

## 2023-11-09 DIAGNOSIS — R45851 Suicidal ideations: Secondary | ICD-10-CM | POA: Diagnosis not present

## 2023-11-09 DIAGNOSIS — I10 Essential (primary) hypertension: Secondary | ICD-10-CM | POA: Diagnosis not present

## 2023-11-09 DIAGNOSIS — T887XXA Unspecified adverse effect of drug or medicament, initial encounter: Secondary | ICD-10-CM | POA: Diagnosis not present

## 2023-11-09 DIAGNOSIS — I25118 Atherosclerotic heart disease of native coronary artery with other forms of angina pectoris: Secondary | ICD-10-CM | POA: Diagnosis not present

## 2023-11-09 DIAGNOSIS — D72829 Elevated white blood cell count, unspecified: Secondary | ICD-10-CM | POA: Insufficient documentation

## 2023-11-09 DIAGNOSIS — E1151 Type 2 diabetes mellitus with diabetic peripheral angiopathy without gangrene: Secondary | ICD-10-CM | POA: Diagnosis not present

## 2023-11-09 DIAGNOSIS — I493 Ventricular premature depolarization: Secondary | ICD-10-CM | POA: Diagnosis not present

## 2023-11-09 DIAGNOSIS — T50904A Poisoning by unspecified drugs, medicaments and biological substances, undetermined, initial encounter: Secondary | ICD-10-CM | POA: Diagnosis not present

## 2023-11-09 LAB — ETHANOL: Alcohol, Ethyl (B): <15 mg/dL (ref <15)

## 2023-11-09 LAB — CBC WITH DIFFERENTIAL/PLATELET
Abs Immature Granulocytes: 0.17 K/uL — ABNORMAL HIGH (ref 0.00–0.07)
Basophils Absolute: 0.1 K/uL (ref 0.0–0.1)
Basophils Relative: 1 %
Eosinophils Absolute: 0.2 K/uL (ref 0.0–0.5)
Eosinophils Relative: 1 %
HCT: 42.4 % (ref 39.0–52.0)
Hemoglobin: 13.9 g/dL (ref 13.0–17.0)
Immature Granulocytes: 1 %
Lymphocytes Relative: 11 %
Lymphs Abs: 1.4 K/uL (ref 0.7–4.0)
MCH: 32.5 pg (ref 26.0–34.0)
MCHC: 32.8 g/dL (ref 30.0–36.0)
MCV: 99.1 fL (ref 80.0–100.0)
Monocytes Absolute: 1 K/uL (ref 0.1–1.0)
Monocytes Relative: 8 %
Neutro Abs: 9.8 K/uL — ABNORMAL HIGH (ref 1.7–7.7)
Neutrophils Relative %: 78 %
Platelets: 331 K/uL (ref 150–400)
RBC: 4.28 MIL/uL (ref 4.22–5.81)
RDW: 13.7 % (ref 11.5–15.5)
WBC: 12.6 K/uL — ABNORMAL HIGH (ref 4.0–10.5)
nRBC: 0 % (ref 0.0–0.2)

## 2023-11-09 LAB — RAPID URINE DRUG SCREEN, HOSP PERFORMED
Amphetamines: NOT DETECTED
Barbiturates: NOT DETECTED
Benzodiazepines: NOT DETECTED
Cocaine: NOT DETECTED
Opiates: NOT DETECTED
Tetrahydrocannabinol: NOT DETECTED

## 2023-11-09 LAB — COMPREHENSIVE METABOLIC PANEL WITH GFR
ALT: 29 U/L (ref 0–44)
AST: 46 U/L — ABNORMAL HIGH (ref 15–41)
Albumin: 3.5 g/dL (ref 3.5–5.0)
Alkaline Phosphatase: 62 U/L (ref 38–126)
Anion gap: 15 (ref 5–15)
BUN: 15 mg/dL (ref 8–23)
CO2: 25 mmol/L (ref 22–32)
Calcium: 10.1 mg/dL (ref 8.9–10.3)
Chloride: 100 mmol/L (ref 98–111)
Creatinine, Ser: 1.03 mg/dL (ref 0.61–1.24)
GFR, Estimated: >60 mL/min (ref >60)
Glucose, Bld: 186 mg/dL — ABNORMAL HIGH (ref 70–99)
Potassium: 3.6 mmol/L (ref 3.5–5.1)
Sodium: 140 mmol/L (ref 135–145)
Total Bilirubin: 1.2 mg/dL (ref 0.0–1.2)
Total Protein: 7.1 g/dL (ref 6.5–8.1)

## 2023-11-09 LAB — ACETAMINOPHEN LEVEL
Acetaminophen (Tylenol), Serum: <10 ug/mL — ABNORMAL LOW (ref 10–30)
Acetaminophen (Tylenol), Serum: <10 ug/mL — ABNORMAL LOW (ref 10–30)

## 2023-11-09 LAB — CBG MONITORING, ED: Glucose-Capillary: 170 mg/dL — ABNORMAL HIGH (ref 70–99)

## 2023-11-09 LAB — SALICYLATE LEVEL: Salicylate Lvl: <7 mg/dL — ABNORMAL LOW (ref 7.0–30.0)

## 2023-11-09 MED ORDER — HYDRALAZINE HCL 20 MG/ML IJ SOLN
10.0000 mg | Freq: Once | INTRAMUSCULAR | Status: AC
  Administered 2023-11-09: 10 mg via INTRAVENOUS
  Filled 2023-11-09: qty 1

## 2023-11-09 MED ORDER — ONDANSETRON HCL 4 MG/2ML IJ SOLN
4.0000 mg | Freq: Once | INTRAMUSCULAR | Status: AC
  Administered 2023-11-09: 4 mg via INTRAVENOUS
  Filled 2023-11-09: qty 2

## 2023-11-09 MED ORDER — NALOXONE HCL 0.4 MG/ML IJ SOLN: 0.4000 mg | Freq: Once | INTRAMUSCULAR | Status: DC

## 2023-11-09 MED ORDER — ONDANSETRON 4 MG PO TBDP: 4.0000 mg | ORAL_TABLET | Freq: Once | ORAL | Status: DC

## 2023-11-09 NOTE — ED Notes (Signed)
 Pt attempted to urinate for sample but unable to. Will retry in 30 minutes.

## 2023-11-09 NOTE — ED Triage Notes (Addendum)
 BIB [facility] from home for Tramadol OD. Took 10-60 tablets (~500mg ) of wife's tramadol. LSN 1400. BP elevated. Has not had his BP meds today. SPO2 was in 80s. Narcan  given PTA. Placed on 4L [place]. HR 120s. Arrives alert, NAD, calm, interactive, appropriate. Endorses SI with OD attempt. Skin W&D. Wife arriving to [facility].

## 2023-11-09 NOTE — ED Notes (Signed)
 CCMD called.

## 2023-11-09 NOTE — ED Provider Notes (Signed)
 [place] EMERGENCY DEPARTMENT AT [facility] Provider Note  CSN: [id number] Arrival date & time: 11/09/23 1631  Chief Complaint(s) Drug Overdose and Suicide Attempt  HPI [name] is a 78 y.o. male with past medical history as below, significant for CKD, CAD, DM, HLD, PVD, left midfoot ulcer and necrosis with recent surgery, pulmonary sarcoid, OSA who presents to the ED with complaint of suicide attempt  Patient is a poor historian, reports that he took an unknown quantity of tramadol tablets in an attempt to kill himself.  Patient took the medication around 90 minutes ago.  He was given Narcan  by EMS with improvement to mental and respiratory status.  Patient reports some nausea, denies any other coingestions, but otherwise does not want to answer any further questions.  Spoke to wife at bedside regarding circumstances of presentation today.  She denies any prior suicide attempt or inclination that he was suicidal.  Saw the cardiologist earlier today, she reports that he seemed be doing well overall.  She thinks he might be depressed secondary to troubles with his left foot.  Also did not take his blood pressure medicine this morning  Past Medical History Past Medical History:  Diagnosis Date   Adrenal insufficiency (HCC)    Allergy    Anemia    Arthritis    feet    Broken foot 09/2019   had to wore a boot. Left    CKD (chronic kidney disease), stage III (HCC)    Coronary artery disease    has stents   Coronary atherosclerosis of native coronary artery    Proximal LAD, posterior lateral stent widely patent-10/12/11   Diabetes mellitus    insulin  and pills   Hearing loss    wears hearing aids   History of blood transfusion 06/30/2016   [name] - 2 units transfused   Hyperlipidemia    Hypertension    OSA (obstructive sleep apnea)    uses VPAC sleep study 2 years done through [place]. Dr. Jeffrie arranged study   PVD (peripheral vascular disease) (HCC)    Sarcoidosis     Sleep apnea    uses CPAP nightly   Thyroid  disease    Type 2 diabetes mellitus [facility])    Patient Active Problem List   Diagnosis Date Noted   Ulcer of left midfoot with necrosis of bone (HCC) 10/10/2023   Hyponatremia 10/09/2023   Cellulitis 10/09/2023   Impacted cerumen of both ears 07/09/2023   Chronic eczematous otitis externa of left ear 07/09/2023   Sensorineural hearing loss, bilateral 07/09/2023   Multifocal pneumonia 08/02/2022   Severe sepsis (HCC) 03/16/2022   Right upper lobe pneumonia 03/16/2022   Cellulitis of left lower extremity 03/15/2022   HTN (hypertension) 10/20/2021   Chest pain of uncertain etiology    Frequent PVCs    Acute renal failure superimposed on stage 3a chronic kidney disease (HCC) 10/19/2021   Lobar pneumonia (HCC) 10/07/2021   CAP (community acquired pneumonia) 10/01/2021   Back pain 09/23/2021   Herniated lumbar intervertebral disc 09/23/2021   Lumbar radiculopathy, acute 09/14/2021   Azotemia 09/14/2021   Intractable low back pain 09/13/2021   Acute encephalopathy 09/13/2021   Bandemia 09/13/2021   Acute respiratory failure with hypoxia (HCC) 09/13/2021   DOE (dyspnea on exertion) 12/25/2020   Gastric polyps    GI bleed 01/01/2020   Osteomyelitis (HCC) 01/01/2020   Charcot's joint, left ankle and foot 11/06/2019   Peripheral arterial disease (HCC) 07/12/2019   Pain in  right elbow 10/30/2018   AKI (acute kidney injury) (HCC) 10/03/2018   Hypoalbuminemia 10/03/2018   Hypokalemia 10/03/2018   Hypothyroidism 10/03/2018   Stage 3a chronic kidney disease (CKD) (HCC)    Other iron  deficiency anemias    Normocytic anemia 06/29/2016   Chronic diastolic heart failure (HCC) 01/14/2014   Stable angina (HCC) 01/14/2014   Obesity 01/14/2014   Pulmonary sarcoidosis (HCC) 01/14/2014   Uncontrolled type 2 diabetes mellitus with hyperglycemia, with long-term current use of insulin  (HCC) 01/14/2014   Hyperlipidemia 01/14/2014   Coronary  atherosclerosis of native coronary artery    Sinusitis, chronic 05/30/2011   OSA (obstructive sleep apnea) 10/09/2008   History of adrenal insufficiency 02/21/2008   Coronary artery disease with exertional angina (HCC) 11/20/2007   Sarcoidosis 02/20/2007   GERD 12/20/2006   Home Medication(s) Prior to Admission medications   Medication Sig Start Date End Date Taking? Authorizing Provider  acetaminophen  (TYLENOL ) 500 MG tablet Take 1,000 mg by mouth daily as needed for mild pain (pain).    [provider]  albuterol  (PROVENTIL ) (2.5 MG/3ML) 0.083% nebulizer solution Take 3 mLs (2.5 mg total) by nebulization every 6 (six) hours as needed for wheezing or shortness of breath. Dx Code D86.0 07/24/14   Brien Belvie BRAVO, MD  amLODipine  (NORVASC ) 5 MG tablet Take 1 tablet (5 mg total) by mouth daily. 10/14/23   Akula, Vijaya, MD  ascorbic acid  (VITAMIN C ) 500 MG tablet Take 1 tablet (500 mg total) by mouth 2 (two) times daily. 10/13/23   Akula, Vijaya, MD  clopidogrel  (PLAVIX ) 75 MG tablet Take 1 tablet (75 mg total) by mouth daily. Restart on 09/28/21 Patient taking differently: Take 75 mg by mouth every evening. Restart on 09/28/21 09/25/21   Maree Bracken D, DO  Ensure Max Protein (ENSURE MAX PROTEIN) LIQD Take 330 mLs (11 oz total) by mouth at bedtime. 10/13/23   Akula, Vijaya, MD  ezetimibe  (ZETIA ) 10 MG tablet Take 1 tablet (10 mg total) by mouth in the morning. 10/13/23   Akula, Vijaya, MD  gabapentin  (NEURONTIN ) 100 MG capsule Take 100 mg by mouth as needed. 08/04/23   [provider]  levothyroxine  (SYNTHROID ) 75 MCG tablet Take 75 mcg by mouth in the morning. 02/24/21   [provider]  metoprolol  tartrate (LOPRESSOR ) 25 MG tablet TAKE 0.5 TABLETS BY MOUTH 2 TIMES DAILY. Patient taking differently: Take 25 mg by mouth daily. 02/13/23   Jeffrie Oneil BROCKS, MD  montelukast  (SINGULAIR ) 10 MG tablet Take 10 mg by mouth daily as needed (allergies). 07/18/18   [provider]   Multiple Vitamin (MULTIVITAMIN WITH MINERALS) TABS tablet Take 1 tablet by mouth daily. 10/14/23   Akula, Vijaya, MD  NOVOLIN 70/30 (70-30) 100 UNIT/ML injection Inject 30-40 Units into the skin. 08/23/23   [provider]  nutrition supplement, JUVEN, (JUVEN) PACK Take 1 packet by mouth 2 (two) times daily between meals. 10/13/23   Akula, Vijaya, MD  pantoprazole  (PROTONIX ) 40 MG tablet Take 40 mg by mouth daily. 08/10/23   [provider]  potassium chloride  (MICRO-K ) 10 MEQ CR capsule Take 20 mEq by mouth daily. 08/04/23   [provider]  predniSONE  (DELTASONE ) 10 MG tablet Take 10 mg by mouth in the morning. Continuous course.    [provider]  rosuvastatin  (CRESTOR ) 20 MG tablet TAKE 1 TABLET AT BEDTIME Patient taking differently: Take 20 mg by mouth once a week. On Sundays 09/25/23   Jeffrie Oneil BROCKS, MD  zinc  sulfate, 50mg  elemental  zinc , 220 (50 Zn) MG capsule Take 1 capsule (220 mg total) by mouth daily. 10/14/23   Cherlyn Labella, MD                                                                                                                                    Past Surgical History Past Surgical History:  Procedure Laterality Date   APPENDECTOMY     CATARACT EXTRACTION  2011   bilat   CHOLECYSTECTOMY  01/25/2011   Procedure: LAPAROSCOPIC CHOLECYSTECTOMY WITH INTRAOPERATIVE CHOLANGIOGRAM;  Surgeon: Redell Alm Faith, DO;  Location: [facility] OR;  Service: General;  Laterality: N/A;   COLONOSCOPY     several   COLONOSCOPY WITH PROPOFOL  N/A 01/03/2020   Procedure: COLONOSCOPY WITH PROPOFOL ;  Surgeon: Avram Lupita BRAVO, MD;  Location: [facility] ENDOSCOPY;  Service: Endoscopy;  Laterality: N/A;   CORONARY ANGIOPLASTY     most recent 11/2009   CORONARY PRESSURE/FFR STUDY N/A 01/18/2021   Procedure: INTRAVASCULAR PRESSURE WIRE/FFR STUDY;  Surgeon: Wonda Sharper, MD;  Location: [facility] INVASIVE CV LAB;  Service: Cardiovascular;  Laterality: N/A;   CORONARY STENT INTERVENTION N/A  08/07/2018   Procedure: CORONARY STENT INTERVENTION;  Surgeon: Dann Candyce RAMAN, MD;  Location: [facility] INVASIVE CV LAB;  Service: Cardiovascular;  Laterality: N/A;   CORONARY STENT INTERVENTION N/A 01/18/2021   Procedure: CORONARY STENT INTERVENTION;  Surgeon: Wonda Sharper, MD;  Location: [facility] INVASIVE CV LAB;  Service: Cardiovascular;  Laterality: N/A;   CORONARY STENT PLACEMENT  2009   in LAD and side branch PTCA   ESOPHAGOGASTRODUODENOSCOPY (EGD) WITH PROPOFOL  N/A 01/03/2020   Procedure: ESOPHAGOGASTRODUODENOSCOPY (EGD) WITH PROPOFOL ;  Surgeon: Avram Lupita BRAVO, MD;  Location: [facility] ENDOSCOPY;  Service: Endoscopy;  Laterality: N/A;   HEMOSTASIS CLIP PLACEMENT  01/03/2020   Procedure: HEMOSTASIS CLIP PLACEMENT;  Surgeon: Avram Lupita BRAVO, MD;  Location: [facility] ENDOSCOPY;  Service: Endoscopy;;   INTERCOSTAL NERVE BLOCK  2011, 06/2016   x2. lumbar spine   LEFT HEART CATHETERIZATION WITH CORONARY ANGIOGRAM Bilateral 10/12/2011   Procedure: LEFT HEART CATHETERIZATION WITH CORONARY ANGIOGRAM;  Surgeon: Oneil Parchment, MD;  Location: [facility] CATH LAB;  Service: Cardiovascular;  Laterality: Bilateral;   LEFT HEART CATHETERIZATION WITH CORONARY ANGIOGRAM N/A 04/01/2014   Procedure: LEFT HEART CATHETERIZATION WITH CORONARY ANGIOGRAM;  Surgeon: Oneil Parchment, MD;  Location: [facility] CATH LAB;  Service: Cardiovascular;  Laterality: N/A;   POLYPECTOMY  01/03/2020   Procedure: POLYPECTOMY;  Surgeon: Avram Lupita BRAVO, MD;  Location: [facility] ENDOSCOPY;  Service: Endoscopy;;   RIGHT/LEFT HEART CATH AND CORONARY ANGIOGRAPHY N/A 08/07/2018   Procedure: RIGHT/LEFT HEART CATH AND CORONARY ANGIOGRAPHY;  Surgeon: Dann Candyce RAMAN, MD;  Location: [facility] INVASIVE CV LAB;  Service: Cardiovascular;  Laterality: N/A;   RIGHT/LEFT HEART CATH AND CORONARY ANGIOGRAPHY N/A 12/25/2020   Procedure: RIGHT/LEFT HEART CATH AND CORONARY ANGIOGRAPHY;  Surgeon: Claudene Victory ORN, MD;  Location: [facility] INVASIVE CV LAB;  Service: Cardiovascular;  Laterality: N/A;  TRANSFORAMINAL LUMBAR INTERBODY FUSION W/ MIS 1 LEVEL N/A 09/24/2021   Procedure: LUMBAR FOUR-FIVE MINIMALLY INVASIVE (MIS) TRANSFORAMINAL LUMBAR INTERBODY FUSION (TLIF) WITH METRX;  Surgeon: Cheryle Debby LABOR, MD;  Location: [facility] OR;  Service: Neurosurgery;  Laterality: N/A;   Family History Family History  Problem Relation Age of Onset   Kidney disease Mother    Diabetes Mother    Kidney cancer Mother    Heart attack Father    Asthma Sister    Anesthesia problems Sister        Kidney's did not wake up   Sarcoidosis Sister    Sarcoidosis Niece    Colon cancer Neg Hx    Colon polyps Neg Hx    Rectal cancer Neg Hx    Stomach cancer Neg Hx     Social History Social History   Tobacco Use   Smoking status: Never   Smokeless tobacco: Never  Vaping Use   Vaping status: Never Used  Substance Use Topics   Alcohol use: No   Drug use: No   Allergies Statins, Hydrocortisone , Ranexa [ranolazine], Doxycycline , Hydrocodone , Miralax  [polyethylene glycol], Z-pak [azithromycin ], and Zaroxolyn  [metolazone ]  Review of Systems A thorough review of systems was obtained and all systems are negative except as noted in the HPI and PMH.   Physical Exam Vital Signs  I have reviewed the triage vital signs BP (!) 158/80   Pulse (!) 102   Temp 98.1 F (36.7 C)   Resp 14   Ht 5' 10 (1.778 m)   Wt 90.7 kg   SpO2 100%   BMI 28.70 kg/m  Physical Exam Vitals and nursing note reviewed.  Constitutional:      General: He is not in acute distress.    Appearance: Normal appearance. He is well-developed. He is not ill-appearing.  HENT:     Head: Normocephalic and atraumatic.     Right Ear: External ear normal.     Left Ear: External ear normal.     Nose: Nose normal.     Mouth/Throat:     Mouth: Mucous membranes are moist.  Eyes:     General: No scleral icterus.       Right eye: No discharge.        Left eye: No discharge.  Cardiovascular:     Rate and Rhythm: Normal rate.  Pulmonary:      Effort: Pulmonary effort is normal. No respiratory distress.     Breath sounds: No stridor.  Abdominal:     General: Abdomen is flat. There is no distension.     Tenderness: There is no guarding.  Musculoskeletal:        General: No deformity.     Cervical back: No rigidity.     Comments: Boot left foot, see photo, wound appears to be healing well to plantar surface   Skin:    General: Skin is warm and dry.     Coloration: Skin is not cyanotic, jaundiced or pale.  Neurological:     Mental Status: He is alert.     Cranial Nerves: No facial asymmetry.     Comments: Moving his extremities spontaneously, withdraws from noxious stimulus, no clonus, pupils 3 mm bilateral.  He is not compliant with neurologic testing  Psychiatric:        Speech: He is noncommunicative.        Behavior: Behavior normal. Behavior is cooperative.     Comments: Will nod yes/no to some questions     ED Results  and Treatments Labs (all labs ordered are listed, but only abnormal results are displayed) Labs Reviewed  COMPREHENSIVE METABOLIC PANEL WITH GFR - Abnormal; Notable for the following components:      Result Value   Glucose, Bld 186 (*)    AST 46 (*)    All other components within normal limits  SALICYLATE LEVEL - Abnormal; Notable for the following components:   Salicylate Lvl <7.0 (*)    All other components within normal limits  ACETAMINOPHEN  LEVEL - Abnormal; Notable for the following components:   Acetaminophen  (Tylenol ), Serum <10 (*)    All other components within normal limits  CBC WITH DIFFERENTIAL/PLATELET - Abnormal; Notable for the following components:   WBC 12.6 (*)    Neutro Abs 9.8 (*)    Abs Immature Granulocytes 0.17 (*)    All other components within normal limits  ACETAMINOPHEN  LEVEL - Abnormal; Notable for the following components:   Acetaminophen  (Tylenol ), Serum <10 (*)    All other components within normal limits  CBG MONITORING, ED - Abnormal; Notable for the  following components:   Glucose-Capillary 170 (*)    All other components within normal limits  ETHANOL  RAPID URINE DRUG SCREEN, HOSP PERFORMED                                                                                                                          Radiology DG Chest Portable 1 View Result Date: 11/09/2023 CLINICAL DATA:  Overdose. EXAM: PORTABLE CHEST 1 VIEW COMPARISON:  10/09/2023 FINDINGS: Stable cardiomegaly. Subsegmental atelectasis or scarring. No pulmonary edema, large pleural effusion or pneumothorax. No acute osseous findings. IMPRESSION: Stable cardiomegaly. Subsegmental atelectasis or scarring. Electronically Signed   By: Andrea Gasman M.D.   On: 11/09/2023 22:54    Pertinent labs & imaging results that were available during my care of the patient were reviewed by me and considered in my medical decision making (see MDM for details).  Medications Ordered in ED Medications  hydrALAZINE  (APRESOLINE ) injection 10 mg (10 mg Intravenous Given 11/09/23 1724)  ondansetron  (ZOFRAN ) injection 4 mg (4 mg Intravenous Given 11/09/23 1723)                                                                                                                                     Procedures .Critical Care  Performed by: Elnor Jayson LABOR, DO Authorized by: Elnor Jayson LABOR,  DO   Critical care provider statement:    Critical care time (minutes):  30   Critical care time was exclusive of:  Separately billable procedures and treating other patients   Critical care was necessary to treat or prevent imminent or life-threatening deterioration of the following conditions:  Toxidrome   Critical care was time spent personally by me on the following activities:  Development of treatment plan with patient or surrogate, discussions with consultants, evaluation of patient's response to treatment, examination of patient, ordering and review of laboratory studies, ordering and review of radiographic  studies, ordering and performing treatments and interventions, pulse oximetry, re-evaluation of patient's condition, review of old charts and obtaining history from patient or surrogate   (including critical care time)  Medical Decision Making / ED Course    Medical Decision Making:    LONNEL GJERDE is a 78 y.o. male with past medical history as below, significant for CKD, CAD, DM, HLD, PVD, left midfoot ulcer and necrosis with recent surgery, pulmonary sarcoid, OSA who presents to the ED with complaint of suicide attempt. The complaint involves an extensive differential diagnosis and also carries with it a high risk of complications and morbidity.  Serious etiology was considered. Ddx includes but is not limited to: Opiate overdose, other ingestion, intoxication, metabolic derangement, hypertensive emergency/crisis, etc.  Complete initial physical exam performed, notably the patient was in no acute distress, he is tachycardic and hypertensive.  Uncooperative with exam and interview.    Reviewed and confirmed nursing documentation for past medical history, family history, social history.  Vital signs reviewed.    Intentional overdose of opiate > - Patient with respiratory and mental depression on EMS arrival, did improve precipitously with Narcan , 1mg  IN - Unknown quantity of tramadol tablets but spouse thinks between 10-20 x50mg  tabs - lab eval here is stable - pt sleepy, per spouse this is typical for him, sleeps most of the day. Pt has prior hx of OSA but has had recent sleep study that was indeterminate per spouse, does not were nighttime o2 any longer; he will transiently desat while sleeping but when awakened has no hypoxia. Pt denies subjective dyspnea or cp, reports overall feels back to normal - pt handoff pending further observation per poison control recommendations, once medically cleared dispo per [facility]. IVC was completed given suicide attempt with ongoing suicidal ideation     Clinical Course as of 11/09/23 2354  Thu Nov 09, 2023  2302 Tramadol OD. Obs till 2400 then psych. IVC'd [CC]    Clinical Course User Index [CC] Jerral Meth, MD                    Additional history obtained: -Additional history obtained from family and ems -External records from outside source obtained and reviewed including: Chart review including previous notes, labs, imaging, consultation notes including  Home meds Prior primary care documentation   Lab Tests: -I ordered, reviewed, and interpreted labs.   The pertinent results include:   Labs Reviewed  COMPREHENSIVE METABOLIC PANEL WITH GFR - Abnormal; Notable for the following components:      Result Value   Glucose, Bld 186 (*)    AST 46 (*)    All other components within normal limits  SALICYLATE LEVEL - Abnormal; Notable for the following components:   Salicylate Lvl <7.0 (*)    All other components within normal limits  ACETAMINOPHEN  LEVEL - Abnormal; Notable for the following components:   Acetaminophen  (Tylenol ), Serum <10 (*)  All other components within normal limits  CBC WITH DIFFERENTIAL/PLATELET - Abnormal; Notable for the following components:   WBC 12.6 (*)    Neutro Abs 9.8 (*)    Abs Immature Granulocytes 0.17 (*)    All other components within normal limits  ACETAMINOPHEN  LEVEL - Abnormal; Notable for the following components:   Acetaminophen  (Tylenol ), Serum <10 (*)    All other components within normal limits  CBG MONITORING, ED - Abnormal; Notable for the following components:   Glucose-Capillary 170 (*)    All other components within normal limits  ETHANOL  RAPID URINE DRUG SCREEN, HOSP PERFORMED    Notable for labs stable  EKG   EKG Interpretation Date/Time:  Thursday November 09 2023 16:40:17 EDT Ventricular Rate:  109 PR Interval:  153 QRS Duration:  161 QT Interval:  355 QTC Calculation: 478 R Axis:   -76  Text Interpretation: Ectopic atrial  tachycardia, unifocal Multiple premature complexes, vent & supraven RBBB and LAFB LVH with secondary repolarization abnormality Similar to prior (1/24), no STEMI Confirmed by Elnor Savant ([id number]) on 11/09/2023 4:55:42 PM         Imaging Studies ordered: I ordered imaging studies including cxr I independently visualized the following imaging with scope of interpretation limited to determining acute life threatening conditions related to emergency care; findings noted above I agree with the radiologist interpretation If any imaging was obtained with contrast I closely monitored patient for any possible adverse reaction a/w contrast administration in the emergency department   Medicines ordered and prescription drug management: Meds ordered this encounter  Medications   hydrALAZINE  (APRESOLINE ) injection 10 mg   DISCONTD: ondansetron  (ZOFRAN -ODT) disintegrating tablet 4 mg   ondansetron  (ZOFRAN ) injection 4 mg   DISCONTD: naloxone  (NARCAN ) injection 0.4 mg    -I have reviewed the patients home medicines and have made adjustments as needed   Consultations Obtained: I requested consultation with the poison control,  and discussed lab and imaging findings as well as pertinent plan - they recommend:  8 hr obs   Cardiac Monitoring: The patient was maintained on a cardiac monitor.  I personally viewed and interpreted the cardiac monitored which showed an underlying rhythm of: sinus tachy > improved Continuous pulse oximetry interpreted by myself, 100% on 1LNC.    Social Determinants of Health:  Diagnosis or treatment significantly limited by social determinants of health: na   Reevaluation: After the interventions noted above, I reevaluated the patient and found that they have improved  Co morbidities that complicate the patient evaluation  Past Medical History:  Diagnosis Date   Adrenal insufficiency (HCC)    Allergy    Anemia    Arthritis    feet    Broken foot 09/2019   had  to wore a boot. Left    CKD (chronic kidney disease), stage III (HCC)    Coronary artery disease    has stents   Coronary atherosclerosis of native coronary artery    Proximal LAD, posterior lateral stent widely patent-10/12/11   Diabetes mellitus    insulin  and pills   Hearing loss    wears hearing aids   History of blood transfusion 06/30/2016   [name] - 2 units transfused   Hyperlipidemia    Hypertension    OSA (obstructive sleep apnea)    uses VPAC sleep study 2 years done through [place]. Dr. Jeffrie arranged study   PVD (peripheral vascular disease) [facility])    Sarcoidosis    Sleep apnea  uses CPAP nightly   Thyroid  disease    Type 2 diabetes mellitus (HCC)       Dispostion: Disposition decision including need for hospitalization was considered, and patient disposition pending at time of sign out.    Final Clinical Impression(s) / ED Diagnoses Final diagnoses:  Suicide attempt (HCC)        Elnor Jayson LABOR, DO 11/09/23 2354

## 2023-11-09 NOTE — ED Notes (Signed)
  IVC PAPERWORK COMPLETED AND ATTACHED TO THE CLIPBOARD  IN BLUE ZONE

## 2023-11-09 NOTE — Progress Notes (Signed)
 Subjective   Patient ID:  Craig Noah Sr. is a 78 y.o. (DOB [birth year]) male    Patient presents with  . Follow-up  CAD, diastolic CHF, hypertension, bradycardia, PVCs, CKD, sarcoidosis, PAD, sleep apnea, anemia/prior GI bleed  HPI: [name] is a 78 year old male who last saw me October 2023.  He has the above chronic problems.  In 2021 he had left foot surgery.  It sounds like he probably had cellulitis back then.  2 years ago he again had cellulitis of his left foot.  Last month he was admitted to the hospital with cellulitis of his left foot again.  This was felt to be related to diabetic foot ulcer.  He says he was at the hospital at [facility] in [place] for 6 days.  He says the physicians at [facility] recommended amputation of his leg.  He ended up leaving there and was admitted at [facility] and stayed there a week.  He underwent an angiogram by Dr. Georgina and he had PTA of the left tibial artery and tibioperoneal trunk.  He also had surgery by podiatry-the operative note says he had debridement and cuboid planing and bone biopsy.  He indicates that he is better.  He is in a walking boot but he is not supposed to be putting much of any weight on the left foot just yet.  At home he gets around with a motorized scooter.  He is in a wheelchair here today.  He feels like the foot is healing and he is being followed closely by podiatry.  While he was in the hospital he did have 1 day where he had a fair amount of angina.  It did go away with nitroglycerin  but he says the chest discomfort was fairly intense at 1 point.  Echo in the hospital was read by Dr. Gretta as showing EF 45 to 50% with subtle hypokinesis of the mid-- apical anterior/lateral segments.  The atria were normal.  There was no valve dysfunction.  The IVC was not enlarged and estimated CVP was 3 mmHg.  It looks like his troponins were elevated but flat.  Since discharge he has done fairly well.  He is worried about this drop in  LV function.  However, I pointed out to him that his previous 2 echoes showed EF of 55 to 60% and 50 to 55% so the drop is really marginal.  CAD: Stable now with no active angina.  He is on single antiplatelet therapy with Plavix .  He is on [place].  He can only tolerate a very low-dose of Crestor -he says higher doses cause mood disorder. Diastolic heart failure: Minimal ankle swelling on the left.  He has had no other signs or symptoms of volume overload. Hypertension: At home systolic pressure has been in 869d to 140 at the highest and 110 at the lowest.  On average, his systolic pressure is in the 120s to low 130s.  He does not like having to take several medications for blood pressure and heart failure but he does tolerate them well. Bradycardia: He apparently was placed on a beta-blocker at 1 point but developed symptomatic bradycardia again and the beta-blocker was eventually stopped.  He did not feel well when he was bradycardic but has done better off of it. PVCs: No symptoms.  No syncope. CKD: No overt symptoms. Sarcoidosis: No overt symptoms.  He remains on low-dose prednisone . PAD: As above.  No claudication or rest pain. Anemia/prior GI bleed: No signs or  symptoms of bleeding recently.  Detailed chart review: August 14 EKG my interpretation: Sinus rhythm with right bundle branch block, left anterior fascicular block and mild nonspecific ST-T changes.  EKG on the 15th showed little change.  Most recent lab: Potassium 4.2 and creatinine 1.39 with estimated GFR 52.  Hemoglobin 12.8 with MCV 99.  Magnesium  on the 15th was 1.8.  On the 14th troponin 44 and 41.  On the 14th calcium  normal and ALT normal.  Blood cultures from the 10th were negative.  White count on admission was 10.6.  Glucose on admission was 279.  Chest x-ray report was done at [facility] on August 4 and was read as mild bibasilar atelectasis or scarring with cardiomegaly.   Reviewed and updated this visit by provider: Tobacco   Allergies  Meds  Problems  Med Hx  Surg Hx  Fam Hx        Review of Systems  Constitutional:  Positive for fatigue. Negative for activity change and unexpected weight change.  Respiratory:  Positive for shortness of breath. Negative for cough.   Cardiovascular:  Positive for chest pain and leg swelling. Negative for palpitations.  Gastrointestinal:  Negative for abdominal pain, diarrhea, nausea and vomiting.  Musculoskeletal:  Positive for arthralgias and gait problem.  Neurological:  Negative for dizziness, syncope and light-headedness.     Objective   Vitals:   11/09/23 1110  BP: (!) 152/86  Pulse: 70  SpO2: 95%     Physical Exam Vitals reviewed.  Constitutional:      General: He is not in acute distress.    Appearance: Normal appearance. He is normal weight. He is not ill-appearing.     Comments: Chronically ill elderly male no acute distress  HENT:     Head: Normocephalic and atraumatic.     Nose: Nose normal.  Eyes:     General: No scleral icterus.    Conjunctiva/sclera: Conjunctivae normal.  Cardiovascular:     Rate and Rhythm: Normal rate and regular rhythm. No extrasystoles are present.    Pulses:          Radial pulses are 2+ on the right side and 2+ on the left side.     Heart sounds: Normal heart sounds, S1 normal and S2 normal. No murmur heard.    No friction rub.  Musculoskeletal:     Right lower leg: No edema.     Left lower leg: No edema.  Pulmonary:     Effort: Pulmonary effort is normal. No respiratory distress.     Breath sounds: Normal breath sounds. No decreased breath sounds, wheezing, rhonchi or rales.  Abdominal:     General: Abdomen is flat. There is no distension.     Palpations: Abdomen is soft. There is no mass.     Tenderness: There is no abdominal tenderness. There is no guarding.  Skin:    General: Skin is warm and dry.     Coloration: Skin is not jaundiced or pale.     Findings: No bruising, erythema or rash.  Neurological:      Mental Status: He is alert and oriented to person, place, and time. Mental status is at baseline.  Psychiatric:        Mood and Affect: Mood normal.        Behavior: Behavior normal.        Thought Content: Thought content normal.        Judgment: Judgment normal.       Assessment and Plan  1. Coronary artery disease involving native coronary artery of native heart with other form of angina pectoris (Primary) 2. Chronic diastolic heart failure (*) 3. Bradycardia 4. Primary hypertension 5. Stage 3a chronic kidney disease (*) 6. PVC's (premature ventricular contractions) 7. PAD (peripheral artery disease) 8. Sarcoidosis    Impression: 1.  CAD.  Overall he is now stable with occasional angina.  He did have recent angina when he was hospitalized with an acute infection of his left foot and required complex PTA of his left lower leg. 2.  Chronic diastolic heart failure.  Most recent echo 3 weeks ago showed an EF in the upper 40s.  He is compensated.  Prior echo May of last year showed EF in the upper 39s.  Before that the echo January of last year showed EF in the low 50s. 3.  Bradycardia-stable.  He cannot tolerate beta-blockers because of the bradycardia. 4.  Hypertension well-controlled with current regimen.  He does not like that he is having to take several medications but I think they are required at this time. 5.  CKD stable. 6.  PVCs stable/asymptomatic. 7.  Infrapopliteal PAD.  He had complex PTA by Dr. Georgina 3 weeks ago involving the left anterior tibial artery and the left tibioperoneal trunk. 8.  Sarcoidosis apparently stable on low-dose prednisone  long-term  Plan: Long discussion with patient and his wife.  I recommend he continue his current regimen of blood pressure medications and that he return to see me in 3 months.  If his blood pressure is very well-controlled then we could consider stopping hydralazine  or backing off on some of his other medications (he feels that  losartan  is the main medication that has helped his blood pressure long-term).  As his LV function is only marginally reduced, I am not inclined to switch his ARB to [facility] but that certainly an option-discussed with him and he agrees to stick with losartan .  No stress test for now but if he has a good bit of angina in the near future we could consider a nuclear stress test.  I would consider a repeat echo in 3 months or so.  He is to continue Zetia , low-dose Crestor , Norvasc , Plavix , as needed Lasix , hydralazine  and losartan .  Total time spent with patient and chart: 65 minutes Follow up in about 3 months (around 02/08/2024).      Patient's Medications  New Prescriptions   No medications on file  Previous Medications   ACETAMINOPHEN  (TYLENOL ) 500 MG TABLET    Take two tablets (1,000 mg dose) by mouth every 4 (four) hours as needed for Pain.   ALBUTEROL  SULFATE HFA (PROVENTIL ,VENTOLIN ,PROAIR ) 108 (90 BASE) MCG/ACT INHALER    Inhale two puffs into the lungs every 6 (six) hours as needed for Wheezing.   AMLODIPINE  BESYLATE (NORVASC ) 5 MG TABLET    Take one tablet (5 mg dose) by mouth daily.   CLOPIDOGREL  BISULFATE (PLAVIX ) 75 MG TABLET    Take one tablet (75 mg dose) by mouth daily.   COENZYME Q10 100 MG CAPSULE    Take four capsules (400 mg dose) by mouth daily.   CYCLOBENZAPRINE (FLEXERIL) 5 MG TABLET    Take one tablet (5 mg dose) by mouth 3 (three) times a day as needed for Muscle spasms.   EZETIMIBE  (ZETIA ) 10 MG TABLET    Take one tablet (10 mg dose) by mouth daily.   FERROUS SULFATE  325 (65 FE) MG TABLET    Take one tablet (325 mg dose) by  mouth with breakfast.   FISH OIL (SEA-OMEGA) 1000 MG CAPS    Take one capsule (1,000 mg dose) by mouth daily.   FUROSEMIDE  (LASIX ) 80 MG TABLET    Take one tablet (80 mg dose) by mouth daily as needed.   GABAPENTIN  (NEURONTIN ) 100 MG CAPSULE    Take one capsule (100 mg dose) by mouth at bedtime.   HYDRALAZINE  HCL (APRESOLINE ) 10 MG TABLET    Take one  tablet (10 mg dose) by mouth every 8 (eight) hours.   INSULIN  NPH-INSULIN  REGULAR (NOVOLIN 70/30) (70-30) 100 UNIT/ML INJECTION    Inject 30-40 units three times a day with meals   LEVOTHYROXINE  SODIUM (SYNTHROID ,LEVOTHROID,LOVOXYL) 50 MCG TABLET    Take one tablet (50 mcg dose) by mouth daily.   LOSARTAN  POTASSIUM (COZAAR ) 100 MG TABLET    Take one tablet (100 mg dose) by mouth daily.   MONTELUKAST  (SINGULAIR ) 10 MG TABLET    Take one tablet (10 mg dose) by mouth at bedtime as needed (ALLERGIES).   NITROGLYCERIN  (NITROSTAT ) 0.4 MG SL TABLET    Place one tablet (0.4 mg dose) under the tongue every 5 (five) minutes as needed for Chest pain.   PANTOPRAZOLE  SODIUM (PROTONIX ) 40 MG TABLET    Take one tablet (40 mg dose) by mouth daily.   PREDNISONE  (DELTASONE ) 10 MG TABLET    Take one tablet (10 mg dose) by mouth daily.   ROSUVASTATIN  CALCIUM  (CRESTOR ) 5 MG TABLET    Take one tablet (5 mg dose) by mouth at bedtime.  Modified Medications   No medications on file  Discontinued Medications   ASPIRIN  81 MG EC TABLET    Take one tablet (81 mg dose) by mouth every evening.   MELOXICAM (MOBIC) 7.5 MG TABLET    Take one tablet (7.5 mg dose) by mouth daily as needed.   METOPROLOL  TARTRATE (LOPRESSOR ) 25 MG TABLET    Take one half tablet (12.5 mg dose) by mouth 2 (two) times daily.      Risks, benefits, and alternatives of the medications and treatment plan prescribed today were discussed, and patient expressed understanding. Plan follow-up as discussed or as needed if any worsening symptoms or change in condition.   A yearly preventative health exam was recommended and current age based recommendations were discussed.

## 2023-11-09 NOTE — ED Notes (Signed)
 IVC CASE#  [id number]

## 2023-11-09 NOTE — ED Provider Notes (Signed)
 Care of patient received from prior provider at 11:02 PM, please see their note for complete H/P and care plan.  Received handoff per ED course.  Clinical Course as of 11/10/23 0427  Thu Nov 09, 2023  2302 Tramadol OD. Obs till 2400 then psych. IVC'd [CC]    Clinical Course User Index [CC] Jerral Meth, MD    Reassessment: Reassessed.  He required repeat dosing of Narcan  due to ongoing somnolence.  Readministered inpatient now well metabolized.  Observed for 90 minutes after administration of Narcan  and he needs no further dosing. Wife stated that he has had substantial quality of life decreases recently and feels this is likely related to his actions today.  He endorsed suicidal ideation to prior team.  Patient placed in a psychiatric hold protocol for further evaluation.     Jerral Meth, MD 11/10/23 [phone]

## 2023-11-10 ENCOUNTER — Inpatient Hospital Stay
Admission: AD | Admit: 2023-11-10 | Discharge: 2023-11-11 | DRG: 177 | Disposition: A | Discharge status: Other Acute Inpatient Hospital | Payer: Self-pay | Source: Intra-hospital

## 2023-11-10 ENCOUNTER — Encounter: Payer: Self-pay | Admitting: Psychiatry

## 2023-11-10 ENCOUNTER — Inpatient Hospital Stay

## 2023-11-10 DIAGNOSIS — E039 Hypothyroidism, unspecified: Secondary | ICD-10-CM | POA: Diagnosis present

## 2023-11-10 DIAGNOSIS — J9601 Acute respiratory failure with hypoxia: Secondary | ICD-10-CM | POA: Diagnosis not present

## 2023-11-10 DIAGNOSIS — T1491XA Suicide attempt, initial encounter: Secondary | ICD-10-CM | POA: Diagnosis not present

## 2023-11-10 DIAGNOSIS — I255 Ischemic cardiomyopathy: Secondary | ICD-10-CM | POA: Diagnosis not present

## 2023-11-10 DIAGNOSIS — R1110 Vomiting, unspecified: Secondary | ICD-10-CM | POA: Diagnosis present

## 2023-11-10 DIAGNOSIS — Z974 Presence of external hearing-aid: Secondary | ICD-10-CM

## 2023-11-10 DIAGNOSIS — Z955 Presence of coronary angioplasty implant and graft: Secondary | ICD-10-CM

## 2023-11-10 DIAGNOSIS — I25118 Atherosclerotic heart disease of native coronary artery with other forms of angina pectoris: Secondary | ICD-10-CM | POA: Diagnosis not present

## 2023-11-10 DIAGNOSIS — Z1152 Encounter for screening for COVID-19: Secondary | ICD-10-CM | POA: Diagnosis not present

## 2023-11-10 DIAGNOSIS — H9190 Unspecified hearing loss, unspecified ear: Secondary | ICD-10-CM | POA: Diagnosis present

## 2023-11-10 DIAGNOSIS — J690 Pneumonitis due to inhalation of food and vomit: Secondary | ICD-10-CM | POA: Diagnosis not present

## 2023-11-10 DIAGNOSIS — I10 Essential (primary) hypertension: Secondary | ICD-10-CM | POA: Diagnosis not present

## 2023-11-10 DIAGNOSIS — Z8249 Family history of ischemic heart disease and other diseases of the circulatory system: Secondary | ICD-10-CM | POA: Diagnosis not present

## 2023-11-10 DIAGNOSIS — E1151 Type 2 diabetes mellitus with diabetic peripheral angiopathy without gangrene: Secondary | ICD-10-CM | POA: Diagnosis present

## 2023-11-10 DIAGNOSIS — N1831 Chronic kidney disease, stage 3a: Secondary | ICD-10-CM | POA: Diagnosis not present

## 2023-11-10 DIAGNOSIS — I214 Non-ST elevation (NSTEMI) myocardial infarction: Secondary | ICD-10-CM | POA: Diagnosis not present

## 2023-11-10 DIAGNOSIS — I493 Ventricular premature depolarization: Secondary | ICD-10-CM | POA: Diagnosis not present

## 2023-11-10 DIAGNOSIS — T40422A Poisoning by tramadol, intentional self-harm, initial encounter: Secondary | ICD-10-CM | POA: Diagnosis not present

## 2023-11-10 DIAGNOSIS — E2740 Unspecified adrenocortical insufficiency: Secondary | ICD-10-CM | POA: Diagnosis present

## 2023-11-10 DIAGNOSIS — I2510 Atherosclerotic heart disease of native coronary artery without angina pectoris: Secondary | ICD-10-CM | POA: Diagnosis not present

## 2023-11-10 DIAGNOSIS — Z79890 Hormone replacement therapy (postmenopausal): Secondary | ICD-10-CM | POA: Diagnosis not present

## 2023-11-10 DIAGNOSIS — Z9049 Acquired absence of other specified parts of digestive tract: Secondary | ICD-10-CM | POA: Diagnosis not present

## 2023-11-10 DIAGNOSIS — Z881 Allergy status to other antibiotic agents status: Secondary | ICD-10-CM | POA: Diagnosis not present

## 2023-11-10 DIAGNOSIS — D631 Anemia in chronic kidney disease: Secondary | ICD-10-CM | POA: Diagnosis present

## 2023-11-10 DIAGNOSIS — F32A Depression, unspecified: Secondary | ICD-10-CM | POA: Diagnosis not present

## 2023-11-10 DIAGNOSIS — J189 Pneumonia, unspecified organism: Secondary | ICD-10-CM | POA: Diagnosis not present

## 2023-11-10 DIAGNOSIS — I429 Cardiomyopathy, unspecified: Secondary | ICD-10-CM | POA: Diagnosis not present

## 2023-11-10 DIAGNOSIS — Z841 Family history of disorders of kidney and ureter: Secondary | ICD-10-CM

## 2023-11-10 DIAGNOSIS — Z9842 Cataract extraction status, left eye: Secondary | ICD-10-CM

## 2023-11-10 DIAGNOSIS — E1122 Type 2 diabetes mellitus with diabetic chronic kidney disease: Secondary | ICD-10-CM | POA: Diagnosis not present

## 2023-11-10 DIAGNOSIS — G4733 Obstructive sleep apnea (adult) (pediatric): Secondary | ICD-10-CM | POA: Diagnosis present

## 2023-11-10 DIAGNOSIS — R0902 Hypoxemia: Secondary | ICD-10-CM

## 2023-11-10 DIAGNOSIS — L89892 Pressure ulcer of other site, stage 2: Secondary | ICD-10-CM | POA: Diagnosis not present

## 2023-11-10 DIAGNOSIS — Z7902 Long term (current) use of antithrombotics/antiplatelets: Secondary | ICD-10-CM

## 2023-11-10 DIAGNOSIS — I130 Hypertensive heart and chronic kidney disease with heart failure and stage 1 through stage 4 chronic kidney disease, or unspecified chronic kidney disease: Secondary | ICD-10-CM | POA: Diagnosis not present

## 2023-11-10 DIAGNOSIS — N183 Chronic kidney disease, stage 3 unspecified: Secondary | ICD-10-CM | POA: Diagnosis not present

## 2023-11-10 DIAGNOSIS — Y929 Unspecified place or not applicable: Secondary | ICD-10-CM | POA: Diagnosis not present

## 2023-11-10 DIAGNOSIS — Z8639 Personal history of other endocrine, nutritional and metabolic disease: Secondary | ICD-10-CM | POA: Diagnosis not present

## 2023-11-10 DIAGNOSIS — R7989 Other specified abnormal findings of blood chemistry: Secondary | ICD-10-CM | POA: Diagnosis not present

## 2023-11-10 DIAGNOSIS — Z8051 Family history of malignant neoplasm of kidney: Secondary | ICD-10-CM

## 2023-11-10 DIAGNOSIS — Z79899 Other long term (current) drug therapy: Secondary | ICD-10-CM

## 2023-11-10 DIAGNOSIS — F4321 Adjustment disorder with depressed mood: Principal | ICD-10-CM | POA: Diagnosis present

## 2023-11-10 DIAGNOSIS — I5022 Chronic systolic (congestive) heart failure: Secondary | ICD-10-CM | POA: Diagnosis present

## 2023-11-10 DIAGNOSIS — D869 Sarcoidosis, unspecified: Secondary | ICD-10-CM | POA: Diagnosis present

## 2023-11-10 DIAGNOSIS — T17908A Unspecified foreign body in respiratory tract, part unspecified causing other injury, initial encounter: Secondary | ICD-10-CM

## 2023-11-10 DIAGNOSIS — E785 Hyperlipidemia, unspecified: Secondary | ICD-10-CM | POA: Diagnosis present

## 2023-11-10 DIAGNOSIS — Z9841 Cataract extraction status, right eye: Secondary | ICD-10-CM

## 2023-11-10 DIAGNOSIS — Z885 Allergy status to narcotic agent status: Secondary | ICD-10-CM

## 2023-11-10 DIAGNOSIS — I5020 Unspecified systolic (congestive) heart failure: Secondary | ICD-10-CM | POA: Diagnosis not present

## 2023-11-10 DIAGNOSIS — G928 Other toxic encephalopathy: Secondary | ICD-10-CM | POA: Diagnosis not present

## 2023-11-10 DIAGNOSIS — E7800 Pure hypercholesterolemia, unspecified: Secondary | ICD-10-CM | POA: Diagnosis not present

## 2023-11-10 DIAGNOSIS — Z833 Family history of diabetes mellitus: Secondary | ICD-10-CM

## 2023-11-10 DIAGNOSIS — G9340 Encephalopathy, unspecified: Secondary | ICD-10-CM | POA: Diagnosis not present

## 2023-11-10 DIAGNOSIS — Z825 Family history of asthma and other chronic lower respiratory diseases: Secondary | ICD-10-CM

## 2023-11-10 DIAGNOSIS — Z888 Allergy status to other drugs, medicaments and biological substances status: Secondary | ICD-10-CM

## 2023-11-10 DIAGNOSIS — I21A9 Other myocardial infarction type: Secondary | ICD-10-CM | POA: Diagnosis not present

## 2023-11-10 DIAGNOSIS — L89622 Pressure ulcer of left heel, stage 2: Secondary | ICD-10-CM | POA: Diagnosis not present

## 2023-11-10 DIAGNOSIS — I5032 Chronic diastolic (congestive) heart failure: Secondary | ICD-10-CM | POA: Diagnosis not present

## 2023-11-10 DIAGNOSIS — I739 Peripheral vascular disease, unspecified: Secondary | ICD-10-CM | POA: Diagnosis not present

## 2023-11-10 LAB — RESP PANEL BY RT-PCR (RSV, FLU A&B, COVID)  RVPGX2
Influenza A by PCR: NEGATIVE
Influenza B by PCR: NEGATIVE
Resp Syncytial Virus by PCR: NEGATIVE
SARS Coronavirus 2 by RT PCR: NEGATIVE

## 2023-11-10 LAB — COMPREHENSIVE METABOLIC PANEL WITH GFR
ALT: 20 U/L (ref 0–44)
AST: 28 U/L (ref 15–41)
Albumin: 3.4 g/dL — ABNORMAL LOW (ref 3.5–5.0)
Alkaline Phosphatase: 56 U/L (ref 38–126)
Anion gap: 10 (ref 5–15)
BUN: 22 mg/dL (ref 8–23)
CO2: 29 mmol/L (ref 22–32)
Calcium: 8.9 mg/dL (ref 8.9–10.3)
Chloride: 98 mmol/L (ref 98–111)
Creatinine, Ser: 1.49 mg/dL — ABNORMAL HIGH (ref 0.61–1.24)
GFR, Estimated: 48 mL/min — ABNORMAL LOW (ref >60)
Glucose, Bld: 256 mg/dL — ABNORMAL HIGH (ref 70–99)
Potassium: 4 mmol/L (ref 3.5–5.1)
Sodium: 137 mmol/L (ref 135–145)
Total Bilirubin: 1.2 mg/dL (ref 0.0–1.2)
Total Protein: 6.7 g/dL (ref 6.5–8.1)

## 2023-11-10 LAB — CBC
HCT: 40.1 % (ref 39.0–52.0)
Hemoglobin: 12.8 g/dL — ABNORMAL LOW (ref 13.0–17.0)
MCH: 32.6 pg (ref 26.0–34.0)
MCHC: 31.9 g/dL (ref 30.0–36.0)
MCV: 102 fL — ABNORMAL HIGH (ref 80.0–100.0)
Platelets: 268 K/uL (ref 150–400)
RBC: 3.93 MIL/uL — ABNORMAL LOW (ref 4.22–5.81)
RDW: 13.7 % (ref 11.5–15.5)
WBC: 17.4 K/uL — ABNORMAL HIGH (ref 4.0–10.5)
nRBC: 0 % (ref 0.0–0.2)

## 2023-11-10 LAB — CBG MONITORING, ED
Glucose-Capillary: 237 mg/dL — ABNORMAL HIGH (ref 70–99)
Glucose-Capillary: 261 mg/dL — ABNORMAL HIGH (ref 70–99)
Glucose-Capillary: 247 mg/dL — ABNORMAL HIGH (ref 70–99)

## 2023-11-10 LAB — GLUCOSE, CAPILLARY: Glucose-Capillary: 247 mg/dL — ABNORMAL HIGH (ref 70–99)

## 2023-11-10 MED ORDER — INSULIN ASPART 100 UNIT/ML IJ SOLN
0.0000 [IU] | Freq: Three times a day (TID) | INTRAMUSCULAR | Status: DC
  Administered 2023-11-11: 8 [IU] via SUBCUTANEOUS
  Filled 2023-11-10: qty 1

## 2023-11-10 MED ORDER — ROSUVASTATIN CALCIUM 5 MG PO TABS: 5.0000 mg | ORAL_TABLET | Freq: Every day | ORAL | Status: DC

## 2023-11-10 MED ORDER — EZETIMIBE 10 MG PO TABS
10.0000 mg | ORAL_TABLET | Freq: Every day | ORAL | Status: DC
  Administered 2023-11-10: 10 mg via ORAL
  Filled 2023-11-10 (×3): qty 1

## 2023-11-10 MED ORDER — EZETIMIBE 10 MG PO TABS: 10.0000 mg | ORAL_TABLET | Freq: Every day | ORAL | Status: DC

## 2023-11-10 MED ORDER — ACETAMINOPHEN 500 MG PO TABS: 500.0000 mg | ORAL_TABLET | Freq: Every day | ORAL | Status: DC | PRN

## 2023-11-10 MED ORDER — AMOXICILLIN-POT CLAVULANATE 875-125 MG PO TABS
1.0000 | ORAL_TABLET | Freq: Two times a day (BID) | ORAL | Status: DC
  Administered 2023-11-10 – 2023-11-11 (×2): 1 via ORAL
  Filled 2023-11-10 (×4): qty 1

## 2023-11-10 MED ORDER — ONDANSETRON 4 MG PO TBDP
4.0000 mg | ORAL_TABLET | Freq: Once | ORAL | Status: AC
  Administered 2023-11-10: 4 mg via ORAL
  Filled 2023-11-10: qty 1

## 2023-11-10 MED ORDER — GABAPENTIN 100 MG PO CAPS: 100.0000 mg | ORAL_CAPSULE | Freq: Every evening | ORAL | Status: DC | PRN

## 2023-11-10 MED ORDER — PANTOPRAZOLE SODIUM 40 MG PO TBEC
40.0000 mg | DELAYED_RELEASE_TABLET | Freq: Every day | ORAL | Status: DC
Start: 2023-11-10 — End: 2023-11-11
  Administered 2023-11-10: 40 mg via ORAL
  Filled 2023-11-10: qty 1

## 2023-11-10 MED ORDER — ROSUVASTATIN CALCIUM 10 MG PO TABS
5.0000 mg | ORAL_TABLET | Freq: Every day | ORAL | Status: DC
Start: 2023-11-10 — End: 2023-11-11
  Administered 2023-11-10: 5 mg via ORAL
  Filled 2023-11-10: qty 1

## 2023-11-10 MED ORDER — ALUM & MAG HYDROXIDE-SIMETH 200-200-20 MG/5ML PO SUSP: 30.0000 mL | ORAL | Status: DC | PRN

## 2023-11-10 MED ORDER — OLANZAPINE 5 MG PO TBDP: 5.0000 mg | ORAL_TABLET | Freq: Three times a day (TID) | ORAL | Status: DC | PRN

## 2023-11-10 MED ORDER — CLOPIDOGREL BISULFATE 75 MG PO TABS: 75.0000 mg | ORAL_TABLET | Freq: Every day | ORAL | Status: DC

## 2023-11-10 MED ORDER — LEVOTHYROXINE SODIUM 75 MCG PO TABS
75.0000 ug | ORAL_TABLET | Freq: Every morning | ORAL | Status: DC
  Administered 2023-11-11: 75 ug via ORAL
  Filled 2023-11-10: qty 1

## 2023-11-10 MED ORDER — OLANZAPINE 10 MG IM SOLR: 5.0000 mg | Freq: Three times a day (TID) | INTRAMUSCULAR | Status: DC | PRN

## 2023-11-10 MED ORDER — MAGNESIUM HYDROXIDE 400 MG/5ML PO SUSP: 30.0000 mL | Freq: Every day | ORAL | Status: DC | PRN

## 2023-11-10 MED ORDER — ONDANSETRON 4 MG PO TBDP
4.0000 mg | ORAL_TABLET | Freq: Two times a day (BID) | ORAL | Status: DC
  Administered 2023-11-10 – 2023-11-11 (×2): 4 mg via ORAL
  Filled 2023-11-10 (×2): qty 1

## 2023-11-10 MED ORDER — CLOPIDOGREL BISULFATE 75 MG PO TABS
75.0000 mg | ORAL_TABLET | Freq: Every day | ORAL | Status: DC
  Administered 2023-11-10: 75 mg via ORAL
  Filled 2023-11-10: qty 1

## 2023-11-10 MED ORDER — MONTELUKAST SODIUM 10 MG PO TABS: 10.0000 mg | ORAL_TABLET | Freq: Every day | ORAL | Status: DC | PRN

## 2023-11-10 MED ORDER — LEVOTHYROXINE SODIUM 75 MCG PO TABS
75.0000 ug | ORAL_TABLET | Freq: Every morning | ORAL | Status: DC
  Filled 2023-11-10: qty 1

## 2023-11-10 MED ORDER — HYDRALAZINE HCL 10 MG PO TABS: 10.0000 mg | ORAL_TABLET | Freq: Four times a day (QID) | ORAL | Status: DC | PRN

## 2023-11-10 MED ORDER — INSULIN ASPART 100 UNIT/ML IJ SOLN: 0.0000 [IU] | Freq: Every day | INTRAMUSCULAR | Status: DC

## 2023-11-10 MED ORDER — HYDRALAZINE HCL 10 MG PO TABS
10.0000 mg | ORAL_TABLET | Freq: Two times a day (BID) | ORAL | Status: DC
  Filled 2023-11-10: qty 1

## 2023-11-10 MED ORDER — VITAMIN C 500 MG PO TABS
500.0000 mg | ORAL_TABLET | Freq: Two times a day (BID) | ORAL | Status: DC
Start: 2023-11-10 — End: 2023-11-11
  Administered 2023-11-10 – 2023-11-11 (×2): 500 mg via ORAL
  Filled 2023-11-10 (×2): qty 1

## 2023-11-10 MED ORDER — INSULIN ASPART 100 UNIT/ML IJ SOLN
0.0000 [IU] | Freq: Three times a day (TID) | INTRAMUSCULAR | Status: DC
  Administered 2023-11-10: 5 [IU] via SUBCUTANEOUS

## 2023-11-10 MED ORDER — AMLODIPINE BESYLATE 5 MG PO TABS: 5.0000 mg | ORAL_TABLET | Freq: Every day | ORAL | Status: DC

## 2023-11-10 MED ORDER — NALOXONE HCL 0.4 MG/ML IJ SOLN
0.4000 mg | Freq: Once | INTRAMUSCULAR | Status: AC
  Administered 2023-11-10: 0.4 mg via INTRAVENOUS
  Filled 2023-11-10: qty 1

## 2023-11-10 MED ORDER — FUROSEMIDE 20 MG PO TABS: 80.0000 mg | ORAL_TABLET | Freq: Once | ORAL | Status: DC

## 2023-11-10 MED ORDER — FUROSEMIDE 20 MG PO TABS: 80.0000 mg | ORAL_TABLET | Freq: Every day | ORAL | Status: DC | PRN

## 2023-11-10 MED ORDER — LOSARTAN POTASSIUM 25 MG PO TABS: 100.0000 mg | ORAL_TABLET | Freq: Every day | ORAL | Status: DC

## 2023-11-10 MED ORDER — INSULIN ASPART 100 UNIT/ML IJ SOLN
0.0000 [IU] | Freq: Every day | INTRAMUSCULAR | Status: DC
  Administered 2023-11-10: 2 [IU] via SUBCUTANEOUS
  Filled 2023-11-10: qty 1

## 2023-11-10 MED ORDER — IPRATROPIUM-ALBUTEROL 0.5-2.5 (3) MG/3ML IN SOLN
3.0000 mL | Freq: Four times a day (QID) | RESPIRATORY_TRACT | Status: AC
  Administered 2023-11-10 – 2023-11-11 (×4): 3 mL via RESPIRATORY_TRACT
  Filled 2023-11-10 (×4): qty 3

## 2023-11-10 MED ORDER — ACETAMINOPHEN 325 MG PO TABS: 650.0000 mg | ORAL_TABLET | Freq: Four times a day (QID) | ORAL | Status: DC | PRN

## 2023-11-10 MED ORDER — HYDRALAZINE HCL 10 MG PO TABS
10.0000 mg | ORAL_TABLET | Freq: Two times a day (BID) | ORAL | Status: DC
  Administered 2023-11-10 – 2023-11-11 (×2): 10 mg via ORAL
  Filled 2023-11-10 (×4): qty 1

## 2023-11-10 MED ORDER — ONDANSETRON 4 MG PO TBDP: 4.0000 mg | ORAL_TABLET | Freq: Four times a day (QID) | ORAL | Status: DC | PRN

## 2023-11-10 MED ORDER — LOSARTAN POTASSIUM 50 MG PO TABS: 100.0000 mg | ORAL_TABLET | Freq: Every day | ORAL | Status: DC

## 2023-11-10 MED ORDER — LOSARTAN POTASSIUM 25 MG PO TABS
100.0000 mg | ORAL_TABLET | Freq: Every day | ORAL | Status: DC
  Administered 2023-11-10: 100 mg via ORAL
  Filled 2023-11-10: qty 4

## 2023-11-10 MED ORDER — FUROSEMIDE 20 MG PO TABS
80.0000 mg | ORAL_TABLET | Freq: Every day | ORAL | Status: AC
  Administered 2023-11-10 – 2023-11-11 (×2): 80 mg via ORAL
  Filled 2023-11-10 (×2): qty 4

## 2023-11-10 MED ORDER — AMLODIPINE BESYLATE 5 MG PO TABS
5.0000 mg | ORAL_TABLET | Freq: Every day | ORAL | Status: DC
  Administered 2023-11-10: 5 mg via ORAL
  Filled 2023-11-10: qty 1

## 2023-11-10 MED ORDER — VITAMIN C 500 MG PO TABS
500.0000 mg | ORAL_TABLET | Freq: Two times a day (BID) | ORAL | Status: DC
  Filled 2023-11-10: qty 1

## 2023-11-10 MED ORDER — PANTOPRAZOLE SODIUM 40 MG PO TBEC: 40.0000 mg | DELAYED_RELEASE_TABLET | Freq: Every day | ORAL | Status: DC

## 2023-11-10 NOTE — Significant Event (Signed)
 Rapid Response Event Note   Reason for Call :  Decreased oxygenation  Initial Focused Assessment:    Per bedside RN- patient was just admitted and was on oxygen  previously- but during transport to the patient's room- he was on room air.  When RN checked vitals- patient's oxygen  was in the 60's on room air.    Interventions:   Patient was placed on 3 liters of oxygen - oxygen  saturation now 96%.  Patient vitals stable and patient alert and oriented. Plan of Care:  Bedside RN stated they would notify the MD of this change.   Event Summary:   MD Notified:  Call Time: Arrival Time: End Time:  Allena JINNY Gunther, RN

## 2023-11-10 NOTE — Progress Notes (Signed)
 Patient admitted to gero psych unit under IVC from [facility] emergency department.  Patient was brought to the ER after an intentional over dose of tramadol. Patient presented lethargic.  VS obtained at beginning of admission process and oxygen  saturation was in the 80's. [facility] made aware and rapid response was called.  Dr. Jadapalle and unit manager also made aware.

## 2023-11-10 NOTE — BH Assessment (Signed)
 Comprehensive Clinical Assessment (CCA) Note  11/10/2023 [name] [id number]  Chief Complaint:  Chief Complaint  Patient presents with   Drug Overdose   Suicide Attempt  Disposition: Per [name] patient is recommended for inpatient admission.Disposition SW to pursue appropriate inpatient options.  The patient demonstrates the following risk factors for suicide: Chronic risk factors for suicide include: previous suicide attempts lastnight. Acute risk factors for suicide include: medical concerns. Protective factors for this patient include: positive social support. Considering these factors, the overall suicide risk at this point appears to be high. Patient is not appropriate for outpatient follow up.   Patient is a 78 year old male  who presents involuntarily to [facility] after an intentional drug overdose. He reportedly took an unknown amount of Tramadol in an attempt to end his life. He states he just wanted to go to sleep. He reports ongoing depression symptoms associated with his recent foot pain. He states this has caused issues with ambulating and moving around. Patient resides in the home with his wife and identifies her as his primary support system.Patient reports anhedonia and worthlessness. Patient denies history of past suicide attempts. Patient denies NSSIB, paranoia, substance abuse, HI, and AVH.  Patient denies history of abuse or trauma. Patient denies current legal problems. Patient is not receiving outpatient therapy or psychiatry services at this time. Patient reports he  takes his medications as prescribed (see MAR) and denies recent medication changes. Patient denies previous inpatient admission's for mental health concerns.Patient denies access to weapons.     Per EDP note [name] is a 78 y.o. male with past medical history as below, significant for CKD, CAD, DM, HLD, PVD, left midfoot ulcer and necrosis with recent surgery, pulmonary sarcoid, OSA who presents to  the ED with complaint of suicide attempt   Patient is a poor historian, reports that he took an unknown quantity of tramadol tablets in an attempt to kill himself.  Patient took the medication around 90 minutes ago.  He was given Narcan  by EMS with improvement to mental and respiratory status.  Patient reports some nausea, denies any other coingestions, but otherwise does not want to answer any further questions.  Spoke to wife at bedside regarding circumstances of presentation today.  She denies any prior suicide attempt or inclination that he was suicidal.  Saw the cardiologist earlier today, she reports that he seemed be doing well overall.  She thinks he might be depressed secondary to troubles with his left foot.  Also did not take his blood pressure medicine this morning  Visit Diagnosis:  Suicide attempt  Adjustment disorder with depressed mood   CCA Screening, Triage and Referral (STR)  Patient Reported Information How did you hear about us ? Legal System  What Is the Reason for Your Visit/Call Today? Patient is a 78 year old male  who presents involuntarily to [facility] after an intentional drug overdose. He reportedly took an unknown amount of Tramadol in an attempt to end his life. He states he just wanted to go to sleep. He reports ongoing depression symptoms associated with his recent foot pain. He states this has caused issues with ambulating and moving around. Patient resides in the home with his wife and identifies her as his primary support system.Patient reports anhedonia and worthlessness. Patient denies history of past suicide attempts. Patient denies NSSIB, paranoia, substance abuse, HI, and AVH.  How Long Has This Been Causing You Problems? 1 wk - 1 month  What Do You Feel  Would Help You the Most Today? Treatment for Depression or other mood problem   Have You Recently Had Any Thoughts About Hurting Yourself? Yes  Are You Planning to Commit Suicide/Harm Yourself At This time?  Yes   Flowsheet Row ED from 11/09/2023 in [facility] ED to Hosp-Admission (Discharged) from 10/09/2023 in [facility] ED to Hosp-Admission (Discharged) from 08/01/2022 in [place] [facility] [place] [facility]  C-SSRS RISK CATEGORY High Risk No Risk No Risk    Have you Recently Had Thoughts About Hurting Someone [name]? No  Are You Planning to Harm Someone at This Time? No  Explanation: n/a   Have You Used Any Alcohol or Drugs in the Past 24 Hours? No  How Long Ago Did You Use Drugs or Alcohol? n/a  What Did You Use and How Much? n/a   Do You Currently Have a Therapist/Psychiatrist? No  Name of Therapist/Psychiatrist:    Have You Been Recently Discharged From Any Office Practice or Programs? No  Explanation of Discharge From Practice/Program: n/a     CCA Screening Triage Referral Assessment Type of Contact: Tele-Assessment  Telemedicine Service Delivery: Telemedicine service delivery: This service was provided via telemedicine using a 2-way, interactive audio and video technology  Is this Initial or Reassessment? Is this Initial or Reassessment?: Initial Assessment  Date Telepsych consult ordered in [facility]:  Date Telepsych consult ordered in [facility]: 11/10/23  Time Telepsych consult ordered in [facility]:  Time Telepsych consult ordered in [facility]: 0428  Location of Assessment: [facility] ED  Provider Location: [facility] Services   Collateral Involvement: IVC paperwork   Does Patient Have a Automotive engineer Guardian? No  Legal Guardian Contact Information: n/a  Copy of Legal Guardianship Form: -- (n/a)  Legal Guardian Notified of Arrival: -- (n/a)  Legal Guardian Notified of Pending Discharge: -- (n/a)  If Minor and Not Living with Parent(s), Who has Custody? n/a  Is CPS involved or ever been involved? Never  Is APS involved or ever been involved? Never   Patient Determined To Be At Risk for Harm To Self or  Others Based on Review of Patient Reported Information or Presenting Complaint? Yes, for Self-Harm  Method: Plan with intent and identified person  Availability of Means: In hand or used  Intent: Clearly intends on inflicting harm that could cause death  Notification Required: No need or identified person  Additional Information for Danger to Others Potential: -- (n/a)  Additional Comments for Danger to Others Potential: n/a  Are There Guns or Other Weapons in Your Home? No  Types of Guns/Weapons: n/a  Are These Weapons Safely Secured?                            -- (n/a)  Who Could Verify You Are Able To Have These Secured: n/a  Do You Have any Outstanding Charges, Pending Court Dates, Parole/Probation? Pt denies  Contacted To Inform of Risk of Harm To Self or Others: Law Enforcement    Does Patient Present under Involuntary Commitment? Yes    [place] of Residence: [place]   Patient Currently Receiving the Following Services: Not Receiving Services   Determination of Need: Urgent (48 hours)   Options For Referral: Inpatient Hospitalization     CCA Biopsychosocial Patient Reported Schizophrenia/Schizoaffective Diagnosis in Past: No   Strengths: Family Support   Mental Health Symptoms Depression:  Fatigue; Worthlessness; Change in energy/activity  Duration of Depressive symptoms: Duration of Depressive Symptoms: N/A   Mania:  None   Anxiety:   None   Psychosis:  None   Duration of Psychotic symptoms:    Trauma:  N/A   Obsessions:  N/A   Compulsions:  N/A   Inattention:  N/A   Hyperactivity/Impulsivity:  N/A   Oppositional/Defiant Behaviors:  N/A   Emotional Irregularity:  Potentially harmful impulsivity   Other Mood/Personality Symptoms:  n/a    Mental Status Exam Appearance and self-care  Stature:  Average   Weight:  Average weight   Clothing:  -- (scrubs)   Grooming:  Normal   Cosmetic use:  None   Posture/gait:  Normal    Motor activity:  Slowed   Sensorium  Attention:  Normal   Concentration:  Normal   Orientation:  X5   Recall/memory:  Normal   Affect and Mood  Affect:  Depressed   Mood:  Depressed   Relating  Eye contact:  Normal   Facial expression:  Depressed   Attitude toward examiner:  Cooperative   Thought and Language  Speech flow: Clear and Coherent   Thought content:  Appropriate to Mood and Circumstances   Preoccupation:  None   Hallucinations:  None   Organization:  Coherent   [employer] of Knowledge:  Average   Intelligence:  Average   Abstraction:  Normal   Judgement:  Impaired; Dangerous   Reality Testing:  Adequate   Insight:  Fair   Decision Making:  Impulsive   Social Functioning  Social Maturity:  Impulsive   Social Judgement:  Normal   Stress  Stressors:  Transitions   Coping Ability:  Human resources officer Deficits:  Self-control; Decision making   Supports:  Family; Support needed     Religion: Religion/Spirituality Are You A Religious Person?: Yes What is Your Religious Affiliation?: Baptist How Might This Affect Treatment?: n/a  Leisure/Recreation: Leisure / Recreation Do You Have Hobbies?: No  Exercise/Diet: Exercise/Diet Do You Exercise?: No Have You Gained or Lost A Significant Amount of Weight in the Past Six Months?: No Do You Follow a Special Diet?: No Do You Have Any Trouble Sleeping?: No   CCA Employment/Education Employment/Work Situation: Employment / Work Situation Employment Situation: Unemployed Patient's Job has Been Impacted by Current Illness: No Has Patient ever Been in Equities trader?: No  Education: Education Is Patient Currently Attending School?: No Last Grade Completed: 10 Did You Product manager?: No Did You Have Any Difficulty At [employer]?: No Patient's Education Has Been Impacted by Current Illness: No   CCA Family/Childhood History Family and Relationship History: Family  history Marital status: Married Number of Years Married: 62 What types of issues is patient dealing with in the relationship?: n/a Additional relationship information: n/a Does patient have children?: Yes How many children?: 2 How is patient's relationship with their children?: n/a  Childhood History:  Childhood History By whom was/is the patient raised?: Other (Comment) (n/a) Did patient suffer any verbal/emotional/physical/sexual abuse as a child?: No Did patient suffer from severe childhood neglect?: No Has patient ever been sexually abused/assaulted/raped as an adolescent or adult?: No Was the patient ever a victim of a crime or a disaster?: No Witnessed domestic violence?: No Has patient been affected by domestic violence as an adult?: No       CCA Substance Use Alcohol/Drug Use: Alcohol / Drug Use Pain Medications: n/a Prescriptions: n/a Over the Counter: n/a History of alcohol / drug use?: No history of  alcohol / drug abuse                         ASAM's:  Six Dimensions of Multidimensional Assessment  Dimension 1:  Acute Intoxication and/or Withdrawal Potential:      Dimension 2:  Biomedical Conditions and Complications:      Dimension 3:  Emotional, Behavioral, or Cognitive Conditions and Complications:     Dimension 4:  Readiness to Change:     Dimension 5:  Relapse, Continued use, or Continued Problem Potential:     Dimension 6:  Recovery/Living Environment:     ASAM Severity Score:    ASAM Recommended Level of Treatment:     Substance use Disorder (SUD)    Recommendations for Services/Supports/Treatments:    Disposition Recommendation per psychiatric provider: We recommend inpatient psychiatric hospitalization after medical hospitalization. Patient has been involuntarily committed on 11/10/23.    DSM5 Diagnoses: Patient Active Problem List   Diagnosis Date Noted   Ulcer of left midfoot with necrosis of bone (HCC) 10/10/2023   Hyponatremia  10/09/2023   Cellulitis 10/09/2023   Impacted cerumen of both ears 07/09/2023   Chronic eczematous otitis externa of left ear 07/09/2023   Sensorineural hearing loss, bilateral 07/09/2023   Multifocal pneumonia 08/02/2022   Severe sepsis (HCC) 03/16/2022   Right upper lobe pneumonia 03/16/2022   Cellulitis of left lower extremity 03/15/2022   HTN (hypertension) 10/20/2021   Chest pain of uncertain etiology    Frequent PVCs    Acute renal failure superimposed on stage 3a chronic kidney disease (HCC) 10/19/2021   Lobar pneumonia (HCC) 10/07/2021   CAP (community acquired pneumonia) 10/01/2021   Back pain 09/23/2021   Herniated lumbar intervertebral disc 09/23/2021   Lumbar radiculopathy, acute 09/14/2021   Azotemia 09/14/2021   Intractable low back pain 09/13/2021   Acute encephalopathy 09/13/2021   Bandemia 09/13/2021   Acute respiratory failure with hypoxia (HCC) 09/13/2021   DOE (dyspnea on exertion) 12/25/2020   Gastric polyps    GI bleed 01/01/2020   Osteomyelitis (HCC) 01/01/2020   Charcot's joint, left ankle and foot 11/06/2019   Peripheral arterial disease (HCC) 07/12/2019   Pain in right elbow 10/30/2018   AKI (acute kidney injury) (HCC) 10/03/2018   Hypoalbuminemia 10/03/2018   Hypokalemia 10/03/2018   Hypothyroidism 10/03/2018   Stage 3a chronic kidney disease (CKD) (HCC)    Other iron  deficiency anemias    Normocytic anemia 06/29/2016   Chronic diastolic heart failure (HCC) 01/14/2014   Stable angina (HCC) 01/14/2014   Obesity 01/14/2014   Pulmonary sarcoidosis (HCC) 01/14/2014   Uncontrolled type 2 diabetes mellitus with hyperglycemia, with long-term current use of insulin  (HCC) 01/14/2014   Hyperlipidemia 01/14/2014   Coronary atherosclerosis of native coronary artery    Sinusitis, chronic 05/30/2011   OSA (obstructive sleep apnea) 10/09/2008   History of adrenal insufficiency 02/21/2008   Coronary artery disease with exertional angina (HCC) 11/20/2007    Sarcoidosis 02/20/2007   GERD 12/20/2006     Referrals to Alternative Service(s): Referred to Alternative Service(s):   Place:   Date:   Time:    Referred to Alternative Service(s):   Place:   Date:   Time:    Referred to Alternative Service(s):   Place:   Date:   Time:    Referred to Alternative Service(s):   Place:   Date:   Time:     Sergio Zawislak C Larine Fielding, LCMHCA

## 2023-11-10 NOTE — ED Notes (Signed)
 Called [place]. Sheriff tx to take IVC pt to [facility]

## 2023-11-10 NOTE — ED Notes (Signed)
 Pt has two sets of hearing aids, one silver case, one black case and a white charging tower at bedside.

## 2023-11-10 NOTE — ED Notes (Signed)
 Two sets of hearing aids, one white charging tower, one silver case, one black case given to [employer]

## 2023-11-10 NOTE — ED Notes (Signed)
 Patient son approaches nurse's station and requests information regarding why patient will be transferred to different facility. Pt's primary nurse explained to son that he has been IVC'd and will be moved to a behavioral health unit within [facility]. Pt son very upset and requesting to have someone talk with him to have this sorted out. EDP, Psych NP, and Charge nurse notified of situation. Primary RN aware. Psych NP at bedside speaking with patient's son.

## 2023-11-10 NOTE — ED Notes (Signed)
 Case Number: [id number] 3 copies of the IVC verified in the [facility] on 11/09/2023 and the IVC documents are set to expire on 11/16/2023.

## 2023-11-10 NOTE — ED Provider Notes (Addendum)
 Patient is medically cleared.  He is awake.  He does not feel hungry does not want to eat.  Vitals are normal.  Patient has been accepted to [place] with Dr. Japapalle.   Ruthe Cornet, DO 11/10/23 [id number]    Ruthe Cornet, DO 11/10/23 1051

## 2023-11-10 NOTE — Tx Team (Signed)
 Initial Treatment Plan 11/10/2023 7:49 PM AZAZEL FRANZE FMW:[id number]    PATIENT STRESSORS: Health problems     PATIENT STRENGTHS: Religious Affiliation  Supportive family/friends    PATIENT IDENTIFIED PROBLEMS: Decline in physical health                     DISCHARGE CRITERIA:  Need for constant or close observation no longer present  PRELIMINARY DISCHARGE PLAN: Return to previous living arrangement  PATIENT/FAMILY INVOLVEMENT: This treatment plan has been presented to and reviewed with the patient, [name].  The patient and family have been given the opportunity to ask questions and make suggestions.  Harriette Greig DASEN, RN 11/10/2023, 7:49 PM

## 2023-11-10 NOTE — Progress Notes (Signed)
 Responded to overhead page of rapid response. On arrival to pt room, pt sitting in wheelchair on [address] [place] at 3 lpm. Was reported pt had dropped his O2 Sat. Pt slow to answer questions, difficult to understand. Noted shaking of arms and body, but was more of a tremor. Pt RN present, PA present, RT present, chaplin present.

## 2023-11-10 NOTE — Consult Note (Signed)
 Initial Consultation Note   Patient: [name] FMW:[id number] DOB: [birth year] PCP: Arloa Elsie SAUNDERS, MD DOA: 11/10/2023 DOS: the patient was seen and examined on 11/10/2023 Primary service: Donnelly Mellow, MD  Referring physician: Dr. Donnelly Reason for consult: Hypoxia  Assessment/Plan:  Acute hypoxic respiratory failure - Clinically suspect aspiration acute as patient was seen vomited x 1 in the hallway and became hypoxic few minutes later. - Right now patient appears to be comfortable on 2 L and pulse ox stable 93-94%, no tachypnea or tachycardia, other vital signs pulses 88, blood pressure 132/78, I discussed the case with nurse, blindness to start patient on antibiotics, continue oxygen  therapy, I also gave patient 1 dose of p.o. Lasix  80 mg today.  As well as DuoNeb treatment.  Monitor pulse ox level 2-3 times in the next 2 to 3 hours, if patient oxygen  level maintains, likely patient can stay in the psych unit otherwise contact hospitalist service for possible transfer to medical floor for further management. - Start Augmentin   Chronic HFrEF HTN -Appears to be euvolemic to mild fluid overload -Review of patient most recent hospitalization records showed most recent echo showed LVEF 45-50% with a septal hypokinesis of the mid to apical anterior/lateral segment - Start p.o. Lasix  80 mg daily x 2, starting tonight, then can switch back to as needed Lasix  - Repeat chest x-ray tomorrow - Resume home BP meds including amlodipine , hydralazine , losartan   History of sarcoidosis - Continue prednisone  10 mg daily  Overdose of tramadol - Past 24 hours window of ingestion - Avoid sedation medications   TRH will continue to follow the patient.  HPI: [name] is a 78 y.o. male with past medical history of chronic HFrEF with most recent echo showed LVEF 45-50%, HTN, PVD, recent left-sided diabetic foot infection cellulitis completed antibiotic treatment, was admitted to psychiatric unit  for tramadol overdose.  Yesterday afternoon, patient took 10-16 tablets of 50 mg tramadol became hypoxic.  Narcan  was given by EMS, patient was hypoxic and tachycardia and stabilized on 4 L.  After monitoring overnight patient admitted to geropsychiatric unit.  This afternoon, patient was seen vomited x 1 in the hallway, about 5-10 minutes later patient became hypoxic and tachycardia, psychiatry unit nurse took patient pulse ox was 70% on room air, but patient did not appear to be cyanotic at this point.  Patient was started on nasal cannula 2 L and saturation improved to 93-94%.  I went to see the patient patient complaining about feeling nausea but no abdominal pain, he admitted vomited x 1 but denied any abdominal pain no diarrhea.  Denied any cough no shortness of breath or chest pain.  Review of Systems: As mentioned in the history of present illness. All other systems reviewed and are negative. Past Medical History:  Diagnosis Date   Adrenal insufficiency (HCC)    Allergy    Anemia    Arthritis    feet    Broken foot 09/2019   had to wore a boot. Left    CKD (chronic kidney disease), stage III (HCC)    Coronary artery disease    has stents   Coronary atherosclerosis of native coronary artery    Proximal LAD, posterior lateral stent widely patent-10/12/11   Diabetes mellitus    insulin  and pills   Hearing loss    wears hearing aids   History of blood transfusion 06/30/2016   [name] - 2 units transfused   Hyperlipidemia    Hypertension  OSA (obstructive sleep apnea)    uses VPAC sleep study 2 years done through [facility]. Dr. Jeffrie arranged study   PVD (peripheral vascular disease) (HCC)    Sarcoidosis    Sleep apnea    uses CPAP nightly   Thyroid  disease    Type 2 diabetes mellitus [facility])    Past Surgical History:  Procedure Laterality Date   APPENDECTOMY     CATARACT EXTRACTION  2011   bilat   CHOLECYSTECTOMY  01/25/2011   Procedure: LAPAROSCOPIC CHOLECYSTECTOMY WITH  INTRAOPERATIVE CHOLANGIOGRAM;  Surgeon: Redell Alm Faith, DO;  Location: [facility] OR;  Service: General;  Laterality: N/A;   COLONOSCOPY     several   COLONOSCOPY WITH PROPOFOL  N/A 01/03/2020   Procedure: COLONOSCOPY WITH PROPOFOL ;  Surgeon: Avram Lupita BRAVO, MD;  Location: [facility] ENDOSCOPY;  Service: Endoscopy;  Laterality: N/A;   CORONARY ANGIOPLASTY     most recent 11/2009   CORONARY PRESSURE/FFR STUDY N/A 01/18/2021   Procedure: INTRAVASCULAR PRESSURE WIRE/FFR STUDY;  Surgeon: Wonda Sharper, MD;  Location: [facility] INVASIVE CV LAB;  Service: Cardiovascular;  Laterality: N/A;   CORONARY STENT INTERVENTION N/A 08/07/2018   Procedure: CORONARY STENT INTERVENTION;  Surgeon: Dann Candyce RAMAN, MD;  Location: [facility] INVASIVE CV LAB;  Service: Cardiovascular;  Laterality: N/A;   CORONARY STENT INTERVENTION N/A 01/18/2021   Procedure: CORONARY STENT INTERVENTION;  Surgeon: Wonda Sharper, MD;  Location: [facility] INVASIVE CV LAB;  Service: Cardiovascular;  Laterality: N/A;   CORONARY STENT PLACEMENT  2009   in LAD and side branch PTCA   ESOPHAGOGASTRODUODENOSCOPY (EGD) WITH PROPOFOL  N/A 01/03/2020   Procedure: ESOPHAGOGASTRODUODENOSCOPY (EGD) WITH PROPOFOL ;  Surgeon: Avram Lupita BRAVO, MD;  Location: [facility]COPY;  Service: Endoscopy;  Laterality: N/A;   HEMOSTASIS CLIP PLACEMENT  01/03/2020   Procedure: HEMOSTASIS CLIP PLACEMENT;  Surgeon: Avram Lupita BRAVO, MD;  Location: [facility]OSCOPY;  Service: Endoscopy;;   INTERCOSTAL NERVE BLOCK  2011, 06/2016   x2. lumbar spine   LEFT HEART CATHETERIZATION WITH CORONARY ANGIOGRAM Bilateral 10/12/2011   Procedure: LEFT HEART CATHETERIZATION WITH CORONARY ANGIOGRAM;  Surgeon: Oneil Jeffrie, MD;  Location: [facility] CATH LAB;  Service: Cardiovascular;  Laterality: Bilateral;   LEFT HEART CATHETERIZATION WITH CORONARY ANGIOGRAM N/A 04/01/2014   Procedure: LEFT HEART CATHETERIZATION WITH CORONARY ANGIOGRAM;  Surgeon: Oneil Jeffrie, MD;  Location: [facility] CATH LAB;  Service: Cardiovascular;  Laterality: N/A;    POLYPECTOMY  01/03/2020   Procedure: POLYPECTOMY;  Surgeon: Avram Lupita BRAVO, MD;  Location: [facility] ENDOSCOPY;  Service: Endoscopy;;   RIGHT/LEFT HEART CATH AND CORONARY ANGIOGRAPHY N/A 08/07/2018   Procedure: RIGHT/LEFT HEART CATH AND CORONARY ANGIOGRAPHY;  Surgeon: Dann Candyce RAMAN, MD;  Location: [facility] INVASIVE CV LAB;  Service: Cardiovascular;  Laterality: N/A;   RIGHT/LEFT HEART CATH AND CORONARY ANGIOGRAPHY N/A 12/25/2020   Procedure: RIGHT/LEFT HEART CATH AND CORONARY ANGIOGRAPHY;  Surgeon: Claudene Victory ORN, MD;  Location: [facility] INVASIVE CV LAB;  Service: Cardiovascular;  Laterality: N/A;   TRANSFORAMINAL LUMBAR INTERBODY FUSION W/ MIS 1 LEVEL N/A 09/24/2021   Procedure: LUMBAR FOUR-FIVE MINIMALLY INVASIVE (MIS) TRANSFORAMINAL LUMBAR INTERBODY FUSION (TLIF) WITH METRX;  Surgeon: Cheryle Debby LABOR, MD;  Location: [facility] OR;  Service: Neurosurgery;  Laterality: N/A;   Social History:  reports that he has never smoked. He has never used smokeless tobacco. He reports that he does not drink alcohol and does not use drugs.  Allergies  Allergen Reactions   Statins Other (See Comments)    Aggression  Pt ok to take crestor  20mg  once a week  Hydrocortisone  Nausea Only   Ranexa [Ranolazine] Other (See Comments)    Severe weakness/near syncope after 1 dose Hallucinations    Doxycycline  Diarrhea   Hydrocodone  Nausea And Vomiting   Miralax  [Polyethylene Glycol] Nausea And Vomiting   Z-Pak [Azithromycin ] Other (See Comments)    Raises blood sugar   Zaroxolyn  [Metolazone ] Other (See Comments)    Drained, no energy    Family History  Problem Relation Age of Onset   Kidney disease Mother    Diabetes Mother    Kidney cancer Mother    Heart attack Father    Asthma Sister    Anesthesia problems Sister        Kidney's did not wake up   Sarcoidosis Sister    Sarcoidosis Niece    Colon cancer Neg Hx    Colon polyps Neg Hx    Rectal cancer Neg Hx    Stomach cancer Neg Hx     Prior to Admission  medications   Medication Sig Start Date End Date Taking? Authorizing Provider  acetaminophen  (TYLENOL ) 500 MG tablet Take 500-1,000 mg by mouth daily as needed for mild pain (pain score 1-3) (pain).    [provider]  albuterol  (ACCUNEB ) 1.25 MG/3ML nebulizer solution INHALE THE CONTENTS OF 1 VIAL VIA NEBULIZER EVERY 8 HOURS AS NEEDED    [provider]  albuterol  (VENTOLIN  HFA) 108 (90 Base) MCG/ACT inhaler 2 puffs as needed Inhalation every 6 hrs 12/22/17   [provider]  amLODipine  (NORVASC ) 5 MG tablet Take 1 tablet (5 mg total) by mouth daily. Patient taking differently: Take 5 mg by mouth at bedtime. 10/14/23   Cherlyn Labella, MD  ascorbic acid  (VITAMIN C ) 500 MG tablet Take 1 tablet (500 mg total) by mouth 2 (two) times daily. 10/13/23   Akula, Vijaya, MD  clopidogrel  (PLAVIX ) 75 MG tablet Take 1 tablet (75 mg total) by mouth daily. Restart on 09/28/21 Patient taking differently: Take 75 mg by mouth at bedtime. 09/25/21   Maree, Pratik D, DO  Coenzyme Q10 100 MG capsule Take 100 mg by mouth at bedtime.    [provider]  ezetimibe  (ZETIA ) 10 MG tablet Take 1 tablet (10 mg total) by mouth in the morning. Patient taking differently: Take 10 mg by mouth at bedtime. 10/13/23   Akula, Vijaya, MD  furosemide  (LASIX ) 80 MG tablet Take 80 mg by mouth daily as needed for edema or fluid. Take one tablet (80mg ) by mouth daily as needed    [provider]  gabapentin  (NEURONTIN ) 100 MG capsule Take 100 mg by mouth at bedtime as needed (pain). 08/04/23   [provider]  hydrALAZINE  (APRESOLINE ) 10 MG tablet Take 10 mg by mouth 2 (two) times daily. 10/20/23   [provider]  levothyroxine  (SYNTHROID ) 75 MCG tablet Take 75 mcg by mouth in the morning. 02/24/21   [provider]  losartan  (COZAAR ) 100 MG tablet Take 100 mg by mouth at bedtime.    [provider]  montelukast  (SINGULAIR ) 10 MG tablet Take 10 mg by mouth daily as  needed (allergies). 07/18/18   [provider]  Multiple Vitamin (MULTIVITAMIN WITH MINERALS) TABS tablet Take 1 tablet by mouth daily. Patient taking differently: Take 1 tablet by mouth at bedtime. 10/14/23   Cherlyn Labella, MD  nitroGLYCERIN  (NITROSTAT ) 0.4 MG SL tablet Place 0.4 mg under the tongue every 5 (five) minutes as needed for chest pain. 10/21/23   [provider]  NOVOLIN 70/30 (70-30) 100 UNIT/ML injection  Inject 30-40 Units into the skin. 08/23/23   [provider]  Omega-3 Fatty Acids (FISH OIL) 1000 MG CAPS Take 1,000 mg by mouth at bedtime.    [provider]  pantoprazole  (PROTONIX ) 40 MG tablet Take 40 mg by mouth at bedtime. 08/10/23   [provider]  potassium chloride  (MICRO-K ) 10 MEQ CR capsule Take 20 mEq by mouth at bedtime. 08/04/23   [provider]  predniSONE  (DELTASONE ) 10 MG tablet Take 10 mg by mouth at bedtime. Continuous course.    [provider]  rosuvastatin  (CRESTOR ) 5 MG tablet Take 5 mg by mouth at bedtime. 10/21/23   [provider]  zinc  sulfate, 50mg  elemental zinc , 220 (50 Zn) MG capsule Take 1 capsule (220 mg total) by mouth daily. Patient taking differently: Take 220 mg by mouth at bedtime. 10/14/23   Cherlyn Labella, MD    Physical Exam: There were no vitals filed for this visit. Eyes: PERRL, lids and conjunctivae normal ENMT: Mucous membranes are moist. Posterior pharynx clear of any exudate or lesions.Normal dentition.  Neck: normal, supple, no masses, no thyromegaly Respiratory: clear to auscultation bilaterally, no wheezing, no crackles. Normal respiratory effort. No accessory muscle use.  Cardiovascular: Regular rate and rhythm, no murmurs / rubs / gallops. No extremity edema. 2+ pedal pulses. No carotid bruits.  Abdomen: no tenderness, no masses palpated. No hepatosplenomegaly. Bowel sounds positive.  Musculoskeletal: no clubbing / cyanosis. No joint deformity upper and lower  extremities. Good ROM, no contractures. Normal muscle tone.  Skin: no rashes, lesions, ulcers. No induration Neurologic: CN 2-12 grossly intact. Sensation intact, DTR normal.  Muscle strength 5/5 on both sides Psychiatric: Normal judgment and insight. Alert and oriented x 3. Normal mood.    Data Reviewed:  Chest x-ray from this afternoon was personally reviewed Echocardiogram from recent hospitalization last month was reviewed as well  Family Communication: Left wife detailed message  Primary team communication: psy team Thank you very much for involving us  in the care of your patient.  Author: Cort ONEIDA Mana, MD 11/10/2023 6:25 PM  For on call review [email or web address].

## 2023-11-10 NOTE — Progress Notes (Signed)
 Pt was accepted to [facility] 11/10/2023  Bed Assignment [id number]  Address: [address] [place], [place], [place] [address]  -[facility] Fax: [phone]  Pt meets inpatient criteria per   Attending Physician will be Dr. Allyn Donnelly COME  Report can be called to: [phone]  Pt can arrive after [facility] WILL UPDATE   Care Team notified:Brook McNichol RN, Jerel Gravely NP,      [place] Saturn[place] Liew, MSW, [facility] 11/10/2023 1:26 PM

## 2023-11-10 NOTE — ED Notes (Addendum)
 Per Luke Sprang, RN- Patient is not medically cleared at this time. Will attach the day shift HS for follow up. Thanks

## 2023-11-10 NOTE — ED Notes (Signed)
 Assuming care of pt at this time. Pt's wife is at bedside. Pt is AO, brought in to room on a stretcher. On 1L of o2. Received 2 sets of hearing aids from previous RN,  one silver case, one black case and a white charging tower in a brown paper bag with pt's labeled. Sitter is at bedside

## 2023-11-10 NOTE — ED Notes (Signed)
 Pt oxygen  saturation dropping to 80%. Placed on 1L nasal canula, MD made aware.

## 2023-11-10 NOTE — ED Notes (Signed)
 [facility] here to take pt to .

## 2023-11-11 ENCOUNTER — Inpatient Hospital Stay

## 2023-11-11 ENCOUNTER — Encounter: Payer: Self-pay | Admitting: Family Medicine

## 2023-11-11 ENCOUNTER — Inpatient Hospital Stay: Admit: 2023-11-11

## 2023-11-11 ENCOUNTER — Inpatient Hospital Stay
Admission: AD | Admit: 2023-11-11 | Discharge: 2023-11-18 | DRG: 280 | Disposition: A | Discharge status: Home Health | Source: Ambulatory Visit | Attending: Internal Medicine | Admitting: Internal Medicine

## 2023-11-11 ENCOUNTER — Encounter (HOSPITAL_COMMUNITY): Payer: Self-pay

## 2023-11-11 DIAGNOSIS — I5020 Unspecified systolic (congestive) heart failure: Secondary | ICD-10-CM | POA: Diagnosis not present

## 2023-11-11 DIAGNOSIS — Z79899 Other long term (current) drug therapy: Secondary | ICD-10-CM

## 2023-11-11 DIAGNOSIS — Y92230 Patient room in hospital as the place of occurrence of the external cause: Secondary | ICD-10-CM | POA: Diagnosis not present

## 2023-11-11 DIAGNOSIS — E2740 Unspecified adrenocortical insufficiency: Secondary | ICD-10-CM | POA: Diagnosis present

## 2023-11-11 DIAGNOSIS — G9340 Encephalopathy, unspecified: Secondary | ICD-10-CM | POA: Diagnosis not present

## 2023-11-11 DIAGNOSIS — J9601 Acute respiratory failure with hypoxia: Secondary | ICD-10-CM | POA: Diagnosis not present

## 2023-11-11 DIAGNOSIS — Z7902 Long term (current) use of antithrombotics/antiplatelets: Secondary | ICD-10-CM

## 2023-11-11 DIAGNOSIS — I21A9 Other myocardial infarction type: Principal | ICD-10-CM | POA: Diagnosis present

## 2023-11-11 DIAGNOSIS — E11649 Type 2 diabetes mellitus with hypoglycemia without coma: Secondary | ICD-10-CM | POA: Diagnosis not present

## 2023-11-11 DIAGNOSIS — I130 Hypertensive heart and chronic kidney disease with heart failure and stage 1 through stage 4 chronic kidney disease, or unspecified chronic kidney disease: Secondary | ICD-10-CM | POA: Diagnosis not present

## 2023-11-11 DIAGNOSIS — I739 Peripheral vascular disease, unspecified: Secondary | ICD-10-CM | POA: Diagnosis not present

## 2023-11-11 DIAGNOSIS — N1831 Chronic kidney disease, stage 3a: Secondary | ICD-10-CM | POA: Diagnosis present

## 2023-11-11 DIAGNOSIS — R2689 Other abnormalities of gait and mobility: Secondary | ICD-10-CM | POA: Diagnosis present

## 2023-11-11 DIAGNOSIS — Y9389 Activity, other specified: Secondary | ICD-10-CM

## 2023-11-11 DIAGNOSIS — S0990XA Unspecified injury of head, initial encounter: Secondary | ICD-10-CM | POA: Diagnosis not present

## 2023-11-11 DIAGNOSIS — I429 Cardiomyopathy, unspecified: Secondary | ICD-10-CM | POA: Diagnosis not present

## 2023-11-11 DIAGNOSIS — E871 Hypo-osmolality and hyponatremia: Secondary | ICD-10-CM | POA: Diagnosis not present

## 2023-11-11 DIAGNOSIS — I255 Ischemic cardiomyopathy: Secondary | ICD-10-CM | POA: Diagnosis present

## 2023-11-11 DIAGNOSIS — Z955 Presence of coronary angioplasty implant and graft: Secondary | ICD-10-CM

## 2023-11-11 DIAGNOSIS — I5042 Chronic combined systolic (congestive) and diastolic (congestive) heart failure: Secondary | ICD-10-CM | POA: Diagnosis present

## 2023-11-11 DIAGNOSIS — Z8639 Personal history of other endocrine, nutritional and metabolic disease: Secondary | ICD-10-CM

## 2023-11-11 DIAGNOSIS — J189 Pneumonia, unspecified organism: Secondary | ICD-10-CM | POA: Diagnosis not present

## 2023-11-11 DIAGNOSIS — F4321 Adjustment disorder with depressed mood: Secondary | ICD-10-CM

## 2023-11-11 DIAGNOSIS — K219 Gastro-esophageal reflux disease without esophagitis: Secondary | ICD-10-CM | POA: Diagnosis present

## 2023-11-11 DIAGNOSIS — Z1152 Encounter for screening for COVID-19: Secondary | ICD-10-CM | POA: Diagnosis not present

## 2023-11-11 DIAGNOSIS — I493 Ventricular premature depolarization: Secondary | ICD-10-CM | POA: Diagnosis not present

## 2023-11-11 DIAGNOSIS — G928 Other toxic encephalopathy: Secondary | ICD-10-CM | POA: Diagnosis present

## 2023-11-11 DIAGNOSIS — E039 Hypothyroidism, unspecified: Secondary | ICD-10-CM | POA: Diagnosis present

## 2023-11-11 DIAGNOSIS — M5116 Intervertebral disc disorders with radiculopathy, lumbar region: Secondary | ICD-10-CM | POA: Diagnosis present

## 2023-11-11 DIAGNOSIS — H9190 Unspecified hearing loss, unspecified ear: Secondary | ICD-10-CM | POA: Diagnosis present

## 2023-11-11 DIAGNOSIS — I2510 Atherosclerotic heart disease of native coronary artery without angina pectoris: Secondary | ICD-10-CM | POA: Diagnosis present

## 2023-11-11 DIAGNOSIS — Z794 Long term (current) use of insulin: Secondary | ICD-10-CM | POA: Diagnosis not present

## 2023-11-11 DIAGNOSIS — R45851 Suicidal ideations: Secondary | ICD-10-CM | POA: Diagnosis present

## 2023-11-11 DIAGNOSIS — Z79890 Hormone replacement therapy (postmenopausal): Secondary | ICD-10-CM

## 2023-11-11 DIAGNOSIS — E876 Hypokalemia: Secondary | ICD-10-CM | POA: Diagnosis not present

## 2023-11-11 DIAGNOSIS — D869 Sarcoidosis, unspecified: Secondary | ICD-10-CM | POA: Diagnosis present

## 2023-11-11 DIAGNOSIS — E1140 Type 2 diabetes mellitus with diabetic neuropathy, unspecified: Secondary | ICD-10-CM | POA: Diagnosis present

## 2023-11-11 DIAGNOSIS — L97529 Non-pressure chronic ulcer of other part of left foot with unspecified severity: Secondary | ICD-10-CM | POA: Diagnosis present

## 2023-11-11 DIAGNOSIS — F32A Depression, unspecified: Secondary | ICD-10-CM | POA: Diagnosis not present

## 2023-11-11 DIAGNOSIS — T40422A Poisoning by tramadol, intentional self-harm, initial encounter: Secondary | ICD-10-CM | POA: Diagnosis present

## 2023-11-11 DIAGNOSIS — E1122 Type 2 diabetes mellitus with diabetic chronic kidney disease: Secondary | ICD-10-CM | POA: Diagnosis present

## 2023-11-11 DIAGNOSIS — W010XXA Fall on same level from slipping, tripping and stumbling without subsequent striking against object, initial encounter: Secondary | ICD-10-CM | POA: Diagnosis not present

## 2023-11-11 DIAGNOSIS — T82855A Stenosis of coronary artery stent, initial encounter: Secondary | ICD-10-CM | POA: Diagnosis present

## 2023-11-11 DIAGNOSIS — E11610 Type 2 diabetes mellitus with diabetic neuropathic arthropathy: Secondary | ICD-10-CM | POA: Diagnosis present

## 2023-11-11 DIAGNOSIS — J984 Other disorders of lung: Secondary | ICD-10-CM | POA: Diagnosis not present

## 2023-11-11 DIAGNOSIS — G4733 Obstructive sleep apnea (adult) (pediatric): Secondary | ICD-10-CM | POA: Diagnosis present

## 2023-11-11 DIAGNOSIS — D860 Sarcoidosis of lung: Secondary | ICD-10-CM | POA: Diagnosis present

## 2023-11-11 DIAGNOSIS — I25118 Atherosclerotic heart disease of native coronary artery with other forms of angina pectoris: Secondary | ICD-10-CM | POA: Diagnosis not present

## 2023-11-11 DIAGNOSIS — Y831 Surgical operation with implant of artificial internal device as the cause of abnormal reaction of the patient, or of later complication, without mention of misadventure at the time of the procedure: Secondary | ICD-10-CM | POA: Diagnosis present

## 2023-11-11 DIAGNOSIS — E785 Hyperlipidemia, unspecified: Secondary | ICD-10-CM | POA: Diagnosis not present

## 2023-11-11 DIAGNOSIS — Z885 Allergy status to narcotic agent status: Secondary | ICD-10-CM

## 2023-11-11 DIAGNOSIS — L89892 Pressure ulcer of other site, stage 2: Secondary | ICD-10-CM | POA: Diagnosis not present

## 2023-11-11 DIAGNOSIS — R7989 Other specified abnormal findings of blood chemistry: Secondary | ICD-10-CM | POA: Diagnosis not present

## 2023-11-11 DIAGNOSIS — I214 Non-ST elevation (NSTEMI) myocardial infarction: Secondary | ICD-10-CM

## 2023-11-11 DIAGNOSIS — Z9151 Personal history of suicidal behavior: Secondary | ICD-10-CM

## 2023-11-11 DIAGNOSIS — R918 Other nonspecific abnormal finding of lung field: Secondary | ICD-10-CM | POA: Diagnosis not present

## 2023-11-11 DIAGNOSIS — D631 Anemia in chronic kidney disease: Secondary | ICD-10-CM | POA: Diagnosis present

## 2023-11-11 DIAGNOSIS — Z841 Family history of disorders of kidney and ureter: Secondary | ICD-10-CM

## 2023-11-11 DIAGNOSIS — Z7982 Long term (current) use of aspirin: Secondary | ICD-10-CM

## 2023-11-11 DIAGNOSIS — I517 Cardiomegaly: Secondary | ICD-10-CM | POA: Diagnosis not present

## 2023-11-11 DIAGNOSIS — L89622 Pressure ulcer of left heel, stage 2: Secondary | ICD-10-CM | POA: Diagnosis not present

## 2023-11-11 DIAGNOSIS — E1165 Type 2 diabetes mellitus with hyperglycemia: Secondary | ICD-10-CM | POA: Diagnosis present

## 2023-11-11 DIAGNOSIS — E1151 Type 2 diabetes mellitus with diabetic peripheral angiopathy without gangrene: Secondary | ICD-10-CM | POA: Diagnosis present

## 2023-11-11 DIAGNOSIS — Z833 Family history of diabetes mellitus: Secondary | ICD-10-CM

## 2023-11-11 DIAGNOSIS — I70202 Unspecified atherosclerosis of native arteries of extremities, left leg: Secondary | ICD-10-CM | POA: Diagnosis present

## 2023-11-11 DIAGNOSIS — Z8051 Family history of malignant neoplasm of kidney: Secondary | ICD-10-CM

## 2023-11-11 DIAGNOSIS — Z95820 Peripheral vascular angioplasty status with implants and grafts: Secondary | ICD-10-CM

## 2023-11-11 DIAGNOSIS — K5900 Constipation, unspecified: Secondary | ICD-10-CM | POA: Diagnosis not present

## 2023-11-11 DIAGNOSIS — J690 Pneumonitis due to inhalation of food and vomit: Principal | ICD-10-CM | POA: Diagnosis present

## 2023-11-11 DIAGNOSIS — E7800 Pure hypercholesterolemia, unspecified: Secondary | ICD-10-CM

## 2023-11-11 DIAGNOSIS — Z888 Allergy status to other drugs, medicaments and biological substances status: Secondary | ICD-10-CM

## 2023-11-11 DIAGNOSIS — J90 Pleural effusion, not elsewhere classified: Secondary | ICD-10-CM | POA: Diagnosis not present

## 2023-11-11 DIAGNOSIS — S00211A Abrasion of right eyelid and periocular area, initial encounter: Secondary | ICD-10-CM | POA: Diagnosis not present

## 2023-11-11 DIAGNOSIS — Z881 Allergy status to other antibiotic agents status: Secondary | ICD-10-CM

## 2023-11-11 DIAGNOSIS — T1491XA Suicide attempt, initial encounter: Secondary | ICD-10-CM | POA: Diagnosis present

## 2023-11-11 DIAGNOSIS — R0602 Shortness of breath: Secondary | ICD-10-CM | POA: Diagnosis present

## 2023-11-11 DIAGNOSIS — E11621 Type 2 diabetes mellitus with foot ulcer: Secondary | ICD-10-CM | POA: Diagnosis present

## 2023-11-11 DIAGNOSIS — Z8249 Family history of ischemic heart disease and other diseases of the circulatory system: Secondary | ICD-10-CM

## 2023-11-11 DIAGNOSIS — Z7952 Long term (current) use of systemic steroids: Secondary | ICD-10-CM

## 2023-11-11 DIAGNOSIS — G8929 Other chronic pain: Secondary | ICD-10-CM | POA: Diagnosis present

## 2023-11-11 DIAGNOSIS — Z825 Family history of asthma and other chronic lower respiratory diseases: Secondary | ICD-10-CM

## 2023-11-11 DIAGNOSIS — R109 Unspecified abdominal pain: Secondary | ICD-10-CM | POA: Diagnosis not present

## 2023-11-11 DIAGNOSIS — I10 Essential (primary) hypertension: Secondary | ICD-10-CM | POA: Diagnosis not present

## 2023-11-11 DIAGNOSIS — J40 Bronchitis, not specified as acute or chronic: Secondary | ICD-10-CM | POA: Diagnosis present

## 2023-11-11 DIAGNOSIS — L8990 Pressure ulcer of unspecified site, unspecified stage: Secondary | ICD-10-CM | POA: Insufficient documentation

## 2023-11-11 DIAGNOSIS — Z9049 Acquired absence of other specified parts of digestive tract: Secondary | ICD-10-CM

## 2023-11-11 LAB — TROPONIN I (HIGH SENSITIVITY)
Troponin I (High Sensitivity): 1170 ng/L (ref <18)
Troponin I (High Sensitivity): 992 ng/L (ref <18)

## 2023-11-11 LAB — LACTIC ACID, PLASMA
Lactic Acid, Venous: 1.4 mmol/L (ref 0.5–1.9)
Lactic Acid, Venous: 1.2 mmol/L (ref 0.5–1.9)

## 2023-11-11 LAB — COMPREHENSIVE METABOLIC PANEL WITH GFR
ALT: 18 U/L (ref 0–44)
AST: 29 U/L (ref 15–41)
Albumin: 3 g/dL — ABNORMAL LOW (ref 3.5–5.0)
Alkaline Phosphatase: 66 U/L (ref 38–126)
Anion gap: 9 (ref 5–15)
BUN: 18 mg/dL (ref 8–23)
CO2: 33 mmol/L — ABNORMAL HIGH (ref 22–32)
Calcium: 8.4 mg/dL — ABNORMAL LOW (ref 8.9–10.3)
Chloride: 97 mmol/L — ABNORMAL LOW (ref 98–111)
Creatinine, Ser: 1.38 mg/dL — ABNORMAL HIGH (ref 0.61–1.24)
GFR, Estimated: 53 mL/min — ABNORMAL LOW (ref >60)
Glucose, Bld: 261 mg/dL — ABNORMAL HIGH (ref 70–99)
Potassium: 3.7 mmol/L (ref 3.5–5.1)
Sodium: 139 mmol/L (ref 135–145)
Total Bilirubin: 1.2 mg/dL (ref 0.0–1.2)
Total Protein: 6.8 g/dL (ref 6.5–8.1)

## 2023-11-11 LAB — CBC
HCT: 40.7 % (ref 39.0–52.0)
Hemoglobin: 12.6 g/dL — ABNORMAL LOW (ref 13.0–17.0)
MCH: 31.7 pg (ref 26.0–34.0)
MCHC: 31 g/dL (ref 30.0–36.0)
MCV: 102.3 fL — ABNORMAL HIGH (ref 80.0–100.0)
Platelets: 267 K/uL (ref 150–400)
RBC: 3.98 MIL/uL — ABNORMAL LOW (ref 4.22–5.81)
RDW: 13.6 % (ref 11.5–15.5)
WBC: 14.3 K/uL — ABNORMAL HIGH (ref 4.0–10.5)
nRBC: 0 % (ref 0.0–0.2)

## 2023-11-11 LAB — GLUCOSE, CAPILLARY
Glucose-Capillary: 298 mg/dL — ABNORMAL HIGH (ref 70–99)
Glucose-Capillary: 260 mg/dL — ABNORMAL HIGH (ref 70–99)
Glucose-Capillary: 298 mg/dL — ABNORMAL HIGH (ref 70–99)
Glucose-Capillary: 252 mg/dL — ABNORMAL HIGH (ref 70–99)
Glucose-Capillary: 202 mg/dL — ABNORMAL HIGH (ref 70–99)

## 2023-11-11 LAB — BLOOD GAS, VENOUS
Acid-Base Excess: 9.7 mmol/L — ABNORMAL HIGH (ref 0.0–2.0)
Bicarbonate: 37.3 mmol/L — ABNORMAL HIGH (ref 20.0–28.0)
O2 Saturation: 97.3 %
Patient temperature: 37
pCO2, Ven: 63 mmHg — ABNORMAL HIGH (ref 44–60)
pH, Ven: 7.38 (ref 7.25–7.43)
pO2, Ven: 75 mmHg — ABNORMAL HIGH (ref 32–45)

## 2023-11-11 LAB — BASIC METABOLIC PANEL WITH GFR
Anion gap: 7 (ref 5–15)
BUN: 20 mg/dL (ref 8–23)
CO2: 32 mmol/L (ref 22–32)
Calcium: 8.6 mg/dL — ABNORMAL LOW (ref 8.9–10.3)
Chloride: 100 mmol/L (ref 98–111)
Creatinine, Ser: 1.28 mg/dL — ABNORMAL HIGH (ref 0.61–1.24)
GFR, Estimated: 58 mL/min — ABNORMAL LOW (ref >60)
Glucose, Bld: 270 mg/dL — ABNORMAL HIGH (ref 70–99)
Potassium: 3.8 mmol/L (ref 3.5–5.1)
Sodium: 139 mmol/L (ref 135–145)

## 2023-11-11 LAB — BLOOD GAS, ARTERIAL
Acid-Base Excess: 11.5 mmol/L — ABNORMAL HIGH (ref 0.0–2.0)
Bicarbonate: 38.7 mmol/L — ABNORMAL HIGH (ref 20.0–28.0)
O2 Saturation: 99.2 %
Patient temperature: 37
pCO2 arterial: 61 mmHg — ABNORMAL HIGH (ref 32–48)
pH, Arterial: 7.41 (ref 7.35–7.45)
pO2, Arterial: 109 mmHg — ABNORMAL HIGH (ref 83–108)

## 2023-11-11 LAB — CK: Total CK: 72 U/L (ref 49–397)

## 2023-11-11 LAB — MAGNESIUM: Magnesium: 1.7 mg/dL (ref 1.7–2.4)

## 2023-11-11 LAB — CORTISOL-AM, BLOOD: Cortisol - AM: 13.7 ug/dL (ref 6.7–22.6)

## 2023-11-11 LAB — BRAIN NATRIURETIC PEPTIDE: B Natriuretic Peptide: 838.2 pg/mL — ABNORMAL HIGH (ref 0.0–100.0)

## 2023-11-11 LAB — TSH: TSH: 3.163 u[IU]/mL (ref 0.350–4.500)

## 2023-11-11 MED ORDER — ACETAMINOPHEN 500 MG PO TABS
1000.0000 mg | ORAL_TABLET | Freq: Once | ORAL | Status: AC
  Administered 2023-11-11: 1000 mg via ORAL
  Filled 2023-11-11: qty 2

## 2023-11-11 MED ORDER — NITROGLYCERIN 0.4 MG SL SUBL: 0.4000 mg | SUBLINGUAL_TABLET | SUBLINGUAL | Status: DC | PRN

## 2023-11-11 MED ORDER — IPRATROPIUM-ALBUTEROL 0.5-2.5 (3) MG/3ML IN SOLN
3.0000 mL | Freq: Four times a day (QID) | RESPIRATORY_TRACT | Status: DC
  Filled 2023-11-11: qty 3

## 2023-11-11 MED ORDER — LACTATED RINGERS IV BOLUS
1000.0000 mL | Freq: Once | INTRAVENOUS | Status: AC
  Administered 2023-11-11: 1000 mL via INTRAVENOUS

## 2023-11-11 MED ORDER — PREDNISONE 10 MG PO TABS: 10.0000 mg | ORAL_TABLET | Freq: Every day | ORAL | Status: DC

## 2023-11-11 MED ORDER — SODIUM CHLORIDE 0.9 % IV SOLN
1.0000 g | INTRAVENOUS | Status: DC
  Filled 2023-11-11: qty 10

## 2023-11-11 MED ORDER — ONDANSETRON HCL 4 MG PO TABS
4.0000 mg | ORAL_TABLET | Freq: Four times a day (QID) | ORAL | Status: DC | PRN
  Administered 2023-11-15: 4 mg via ORAL
  Filled 2023-11-11: qty 1

## 2023-11-11 MED ORDER — ENOXAPARIN SODIUM 40 MG/0.4ML IJ SOSY: 40.0000 mg | PREFILLED_SYRINGE | INTRAMUSCULAR | Status: DC

## 2023-11-11 MED ORDER — SODIUM CHLORIDE 0.9 % IV SOLN
2.0000 g | INTRAVENOUS | Status: DC
  Filled 2023-11-11: qty 20

## 2023-11-11 MED ORDER — ACETAMINOPHEN 325 MG PO TABS
650.0000 mg | ORAL_TABLET | Freq: Four times a day (QID) | ORAL | Status: DC | PRN
  Filled 2023-11-11: qty 2

## 2023-11-11 MED ORDER — CLOPIDOGREL BISULFATE 75 MG PO TABS
75.0000 mg | ORAL_TABLET | Freq: Every day | ORAL | Status: DC
  Administered 2023-11-11 – 2023-11-18 (×7): 75 mg via ORAL
  Filled 2023-11-11 (×9): qty 1

## 2023-11-11 MED ORDER — ACETAMINOPHEN 650 MG RE SUPP: 650.0000 mg | Freq: Four times a day (QID) | RECTAL | Status: DC | PRN

## 2023-11-11 MED ORDER — MAGNESIUM HYDROXIDE 400 MG/5ML PO SUSP: 30.0000 mL | Freq: Every day | ORAL | Status: DC | PRN

## 2023-11-11 MED ORDER — SODIUM CHLORIDE 0.9 % IV SOLN: INTRAVENOUS | Status: DC

## 2023-11-11 MED ORDER — DOXYCYCLINE HYCLATE 100 MG PO TABS: 100.0000 mg | ORAL_TABLET | Freq: Two times a day (BID) | ORAL | Status: DC

## 2023-11-11 MED ORDER — IPRATROPIUM-ALBUTEROL 0.5-2.5 (3) MG/3ML IN SOLN
3.0000 mL | RESPIRATORY_TRACT | Status: DC
  Administered 2023-11-11 – 2023-11-12 (×3): 3 mL via RESPIRATORY_TRACT
  Filled 2023-11-11 (×2): qty 3

## 2023-11-11 MED ORDER — PREDNISONE 10 MG PO TABS
10.0000 mg | ORAL_TABLET | Freq: Every day | ORAL | Status: DC
  Filled 2023-11-11: qty 1

## 2023-11-11 MED ORDER — HEPARIN (PORCINE) 25000 UT/250ML-% IV SOLN
1450.0000 [IU]/h | INTRAVENOUS | Status: AC
  Administered 2023-11-11: 1200 [IU]/h via INTRAVENOUS
  Administered 2023-11-12 – 2023-11-13 (×2): 1450 [IU]/h via INTRAVENOUS
  Filled 2023-11-11 (×3): qty 250

## 2023-11-11 MED ORDER — LEVOTHYROXINE SODIUM 50 MCG PO TABS
50.0000 ug | ORAL_TABLET | Freq: Every day | ORAL | Status: DC
  Administered 2023-11-12 – 2023-11-18 (×7): 50 ug via ORAL
  Filled 2023-11-11 (×7): qty 1

## 2023-11-11 MED ORDER — LEVOFLOXACIN IN D5W 750 MG/150ML IV SOLN
750.0000 mg | INTRAVENOUS | Status: DC
  Administered 2023-11-11 – 2023-11-12 (×2): 750 mg via INTRAVENOUS
  Filled 2023-11-11 (×2): qty 150

## 2023-11-11 MED ORDER — SODIUM CHLORIDE 0.9 % IV SOLN
100.0000 mg | Freq: Two times a day (BID) | INTRAVENOUS | Status: DC
  Administered 2023-11-11: 100 mg via INTRAVENOUS
  Filled 2023-11-11: qty 100

## 2023-11-11 MED ORDER — HYDRALAZINE HCL 10 MG PO TABS
10.0000 mg | ORAL_TABLET | Freq: Two times a day (BID) | ORAL | Status: DC
  Administered 2023-11-12 – 2023-11-18 (×12): 10 mg via ORAL
  Filled 2023-11-11 (×13): qty 1

## 2023-11-11 MED ORDER — INSULIN GLARGINE 100 UNIT/ML ~~LOC~~ SOLN
8.0000 [IU] | Freq: Every day | SUBCUTANEOUS | Status: DC
  Administered 2023-11-11: 8 [IU] via SUBCUTANEOUS
  Filled 2023-11-11: qty 0.08

## 2023-11-11 MED ORDER — AMLODIPINE BESYLATE 5 MG PO TABS
5.0000 mg | ORAL_TABLET | Freq: Every day | ORAL | Status: DC
  Administered 2023-11-12: 5 mg via ORAL
  Filled 2023-11-11: qty 1

## 2023-11-11 MED ORDER — PANTOPRAZOLE SODIUM 40 MG IV SOLR
40.0000 mg | Freq: Every day | INTRAVENOUS | Status: DC
  Administered 2023-11-11 – 2023-11-15 (×5): 40 mg via INTRAVENOUS
  Filled 2023-11-11 (×5): qty 10

## 2023-11-11 MED ORDER — TRAZODONE HCL 50 MG PO TABS
25.0000 mg | ORAL_TABLET | Freq: Every evening | ORAL | Status: DC | PRN
  Administered 2023-11-17: 25 mg via ORAL
  Filled 2023-11-11 (×3): qty 1

## 2023-11-11 MED ORDER — MAGNESIUM SULFATE 2 GM/50ML IV SOLN
2.0000 g | Freq: Once | INTRAVENOUS | Status: AC
  Administered 2023-11-11: 2 g via INTRAVENOUS
  Filled 2023-11-11: qty 50

## 2023-11-11 MED ORDER — ONDANSETRON HCL 4 MG/2ML IJ SOLN
4.0000 mg | Freq: Four times a day (QID) | INTRAMUSCULAR | Status: DC | PRN
  Administered 2023-11-14 – 2023-11-17 (×2): 4 mg via INTRAVENOUS
  Filled 2023-11-11 (×3): qty 2

## 2023-11-11 MED ORDER — SODIUM CHLORIDE 0.9 % IV SOLN
3.0000 g | Freq: Four times a day (QID) | INTRAVENOUS | Status: DC
  Filled 2023-11-11 (×2): qty 8

## 2023-11-11 MED ORDER — INSULIN ASPART 100 UNIT/ML IJ SOLN
0.0000 [IU] | INTRAMUSCULAR | Status: DC
  Administered 2023-11-11: 7 [IU] via SUBCUTANEOUS
  Administered 2023-11-12: 11 [IU] via SUBCUTANEOUS
  Administered 2023-11-12 (×3): 7 [IU] via SUBCUTANEOUS
  Administered 2023-11-12 (×2): 11 [IU] via SUBCUTANEOUS
  Administered 2023-11-12: 7 [IU] via SUBCUTANEOUS
  Administered 2023-11-13: 4 [IU] via SUBCUTANEOUS
  Administered 2023-11-13: 7 [IU] via SUBCUTANEOUS
  Administered 2023-11-13: 15 [IU] via SUBCUTANEOUS
  Administered 2023-11-13: 11 [IU] via SUBCUTANEOUS
  Administered 2023-11-13: 7 [IU] via SUBCUTANEOUS
  Administered 2023-11-14 (×2): 4 [IU] via SUBCUTANEOUS
  Administered 2023-11-14: 7 [IU] via SUBCUTANEOUS
  Filled 2023-11-11 (×17): qty 1

## 2023-11-11 MED ORDER — GUAIFENESIN ER 600 MG PO TB12
600.0000 mg | ORAL_TABLET | Freq: Two times a day (BID) | ORAL | Status: DC
  Administered 2023-11-11 – 2023-11-18 (×13): 600 mg via ORAL
  Filled 2023-11-11 (×14): qty 1

## 2023-11-11 MED ORDER — HEPARIN BOLUS VIA INFUSION
4000.0000 [IU] | Freq: Once | INTRAVENOUS | Status: AC
  Administered 2023-11-11: 4000 [IU] via INTRAVENOUS
  Filled 2023-11-11: qty 4000

## 2023-11-11 MED ORDER — METHYLPREDNISOLONE SODIUM SUCC 40 MG IJ SOLR
40.0000 mg | Freq: Two times a day (BID) | INTRAMUSCULAR | Status: DC
  Administered 2023-11-11 – 2023-11-12 (×2): 40 mg via INTRAVENOUS
  Filled 2023-11-11 (×2): qty 1

## 2023-11-11 NOTE — H&P (Deleted)
 SABRA

## 2023-11-11 NOTE — BHH Suicide Risk Assessment (Signed)
 [facility] Admission Suicide Risk Assessment   Nursing information obtained from:  Patient Demographic factors:  Age 78 or older, Male, Caucasian, Unemployed Current Mental Status:  NA Loss Factors:  Decline in physical health Historical Factors:  NA Risk Reduction Factors:  Positive social support, Living with another person, especially a relative  Principal Problem: Adjustment disorder with depressed mood Diagnosis:  Principal Problem:   Adjustment disorder with depressed mood Active Problems:   Hypoxia   Aspiration into airway  Subjective Data: See HPI  Continued Clinical Symptoms:  Alcohol Use Disorder Identification Test Final Score (AUDIT): 0 The Alcohol Use Disorders Identification Test, Guidelines for Use in Primary Care, Second Edition.  World Science writer [facility]). Score between 0-7:  no or low risk or alcohol related problems. Score between 8-15:  moderate risk of alcohol related problems. Score between 16-19:  high risk of alcohol related problems. Score 20 or above:  warrants further diagnostic evaluation for alcohol dependence and treatment.   CLINICAL FACTORS:   Depression:   Anhedonia Hopelessness Impulsivity Severe Unstable or Poor Therapeutic Relationship Medical Diagnoses and Treatments/Surgeries   Musculoskeletal:    Psychiatric Specialty Exam:  Presentation  Mental status exam - Appearance- casual grooming /dress. Appears sedated often sleep while having conversation. Alert and oriented -only oriented to her his name Behavior - cooperative.   Motor activity-Psychomotor retardation noted. Speech - normal rate rhythm volume and tone.  Mood-  "Depressed" Affect -   congruent to the mood Thought Perception-No auditory and visual hallucinations. Does not appear to be responding to internal stimuli . Thought content -ambivalent-first endorsed having suicidal thoughts and then refused. Thought process -illogical, disorganized Association  limited Memory -limited Fund of knowledge -limited Attention -limited. Insight and judgment  poor Estimated Level of intellectual functioning-average. Estimated level of functioning-average.   Physical Exam: Physical Exam ROS Blood pressure (!) 142/73, pulse 91, temperature (!) 100.6 F (38.1 C), resp. rate 18, height 5' 10 (1.778 m), weight 90.7 kg, SpO2 96%. Body mass index is 28.69 kg/m.   COGNITIVE FEATURES THAT CONTRIBUTE TO RISK:  Loss of executive function    SUICIDE RISK:   Moderate:  Frequent suicidal ideation with limited intensity, and duration, some specificity in terms of plans, no associated intent, good self-control, limited dysphoria/symptomatology, some risk factors present, and identifiable protective factors, including available and accessible social support.  PLAN OF CARE: Continue admission to our behavioral unit and stabilize.  I certify that inpatient services furnished can reasonably be expected to improve the patient's condition.   Desmond Chimera, MD 11/11/2023, 2:26 PM

## 2023-11-11 NOTE — Plan of Care (Signed)
 Patient will not get out of bed    Problem: Education: Goal: Knowledge of General Education information will improve Description: Including pain rating scale, medication(s)/side effects and non-pharmacologic comfort measures Outcome: Not Progressing   Problem: Health Behavior/Discharge Planning: Goal: Ability to manage health-related needs will improve Outcome: Not Progressing   Problem: Clinical Measurements: Goal: Ability to maintain clinical measurements within normal limits will improve Outcome: Not Progressing Goal: Will remain free from infection Outcome: Not Progressing Goal: Diagnostic test results will improve Outcome: Not Progressing Goal: Respiratory complications will improve Outcome: Not Progressing Goal: Cardiovascular complication will be avoided Outcome: Not Progressing   Problem: Activity: Goal: Risk for activity intolerance will decrease Outcome: Not Progressing   Problem: Nutrition: Goal: Adequate nutrition will be maintained Outcome: Not Progressing   Problem: Coping: Goal: Level of anxiety will decrease Outcome: Not Progressing   Problem: Elimination: Goal: Will not experience complications related to bowel motility Outcome: Not Progressing Goal: Will not experience complications related to urinary retention Outcome: Not Progressing   Problem: Pain Managment: Goal: General experience of comfort will improve and/or be controlled Outcome: Not Progressing   Problem: Safety: Goal: Ability to remain free from injury will improve Outcome: Not Progressing   Problem: Skin Integrity: Goal: Risk for impaired skin integrity will decrease Outcome: Not Progressing   Problem: Education: Goal: Ability to make informed decisions regarding treatment will improve Outcome: Not Progressing   Problem: Coping: Goal: Coping ability will improve Outcome: Not Progressing   Problem: Health Behavior/Discharge Planning: Goal: Identification of resources  available to assist in meeting health care needs will improve Outcome: Not Progressing   Problem: Medication: Goal: Compliance with prescribed medication regimen will improve Outcome: Not Progressing   Problem: Self-Concept: Goal: Ability to disclose and discuss suicidal ideas will improve Outcome: Not Progressing Goal: Will verbalize positive feelings about self Outcome: Not Progressing Note: Patient is NA. Patient will NA

## 2023-11-11 NOTE — H&P (Addendum)
 [name] is an 78 y.o. male.  Presented with suspected suicide attempt.   Chief Complaint: Suicide attempt via overdose on 10 to 60 tablets of tramadol.  HPI: [name] is an 78 y.o. male.  With no past psych history and past medical history of  HFrEF  -ejection fraction around 45 to 50% ; CKD; DM:HLD, PVD, left midfoot ulcer and necrosis with recent surgery, pulmonary sarcoid, OSA who presents to the ED on 11/09/23 with complaint of suicide attempt.  Patient was IVC and sent to our behavioral health unit for further care.  Reportedly patient overdosed on tramadol on 11/09/2023.  Patient was medically cleared by the ED team and was sent to our behavioral health unit.  While at the behavioral unit yesterday patient was hypoxic and rapid response was called.  Patient also had an episode of 1 vomiting.  Patient at that time was placed on 2 L of nasal oxygen  which increased patient oxygenation to 9394 which was earlier in 80s reportedly.  Patient also received a DuoNeb treatment and was given Lasix  yesterday and was started on antibiotic therapy by hospitalist team.  Chest was also done and suspected pneumonia.  Nurse reports that patient has been denying any suicidal thoughts to them and denying feeling depressed.  Patient on interview today was quite sedated minimally engaging in the interview.  Patient kept dozing off during the conversation.  But able to tell his name.  Patient endorses that he was feeling depressed and had suicidal thoughts initially.  Then this Clinical research associate again asked the patient said that he was not depressed and not suicidal.  Patient appears confused unable to engage in any meaningful conversation today.  This writer tried to reach out to the patient wife at phone number [phone].  Left toe voicemail to contact us  back.  This Clinical research associate also talked to the hospitalist team and given acutely sedated, confused and had an episode of hypoxia she also agreed to do a CT scan and do an  ABG..  Reviewed patient chart patient has never been seen for any psychiatric issues in the past.    Past Medical History:  Diagnosis Date   Adrenal insufficiency (HCC)    Allergy    Anemia    Arthritis    feet    Broken foot 09/2019   had to wore a boot. Left    CKD (chronic kidney disease), stage III (HCC)    Coronary artery disease    has stents   Coronary atherosclerosis of native coronary artery    Proximal LAD, posterior lateral stent widely patent-10/12/11   Diabetes mellitus    insulin  and pills   Hearing loss    wears hearing aids   History of blood transfusion 06/30/2016   [name] - 2 units transfused   Hyperlipidemia    Hypertension    Hypoxia 11/10/2023   OSA (obstructive sleep apnea)    uses VPAC sleep study 2 years done through [employer]. Dr. Jeffrie arranged study   PVD (peripheral vascular disease) (HCC)    Sarcoidosis    Sleep apnea    uses CPAP nightly   Thyroid  disease    Type 2 diabetes mellitus [facility])     Past Surgical History:  Procedure Laterality Date   APPENDECTOMY     CATARACT EXTRACTION  2011   bilat   CHOLECYSTECTOMY  01/25/2011   Procedure: LAPAROSCOPIC CHOLECYSTECTOMY WITH INTRAOPERATIVE CHOLANGIOGRAM;  Surgeon: Redell Alm Faith, DO;  Location: [facility] OR;  Service: General;  Laterality: N/A;   COLONOSCOPY     several   COLONOSCOPY WITH PROPOFOL  N/A 01/03/2020   Procedure: COLONOSCOPY WITH PROPOFOL ;  Surgeon: Avram Lupita BRAVO, MD;  Location: [facility] ENDOSCOPY;  Service: Endoscopy;  Laterality: N/A;   CORONARY ANGIOPLASTY     most recent 11/2009   CORONARY PRESSURE/FFR STUDY N/A 01/18/2021   Procedure: INTRAVASCULAR PRESSURE WIRE/FFR STUDY;  Surgeon: Wonda Sharper, MD;  Location: [facility] INVASIVE CV LAB;  Service: Cardiovascular;  Laterality: N/A;   CORONARY STENT INTERVENTION N/A 08/07/2018   Procedure: CORONARY STENT INTERVENTION;  Surgeon: Dann Candyce RAMAN, MD;  Location: [facility] INVASIVE CV LAB;  Service: Cardiovascular;  Laterality: N/A;   CORONARY  STENT INTERVENTION N/A 01/18/2021   Procedure: CORONARY STENT INTERVENTION;  Surgeon: Wonda Sharper, MD;  Location: [facility] INVASIVE CV LAB;  Service: Cardiovascular;  Laterality: N/A;   CORONARY STENT PLACEMENT  2009   in LAD and side branch PTCA   ESOPHAGOGASTRODUODENOSCOPY (EGD) WITH PROPOFOL  N/A 01/03/2020   Procedure: ESOPHAGOGASTRODUODENOSCOPY (EGD) WITH PROPOFOL ;  Surgeon: Avram Lupita BRAVO, MD;  Location: [facility] ENDOSCOPY;  Service: Endoscopy;  Laterality: N/A;   HEMOSTASIS CLIP PLACEMENT  01/03/2020   Procedure: HEMOSTASIS CLIP PLACEMENT;  Surgeon: Avram Lupita BRAVO, MD;  Location: [facility] ENDOSCOPY;  Service: Endoscopy;;   INTERCOSTAL NERVE BLOCK  2011, 06/2016   x2. lumbar spine   LEFT HEART CATHETERIZATION WITH CORONARY ANGIOGRAM Bilateral 10/12/2011   Procedure: LEFT HEART CATHETERIZATION WITH CORONARY ANGIOGRAM;  Surgeon: Oneil Parchment, MD;  Location: [facility] CATH LAB;  Service: Cardiovascular;  Laterality: Bilateral;   LEFT HEART CATHETERIZATION WITH CORONARY ANGIOGRAM N/A 04/01/2014   Procedure: LEFT HEART CATHETERIZATION WITH CORONARY ANGIOGRAM;  Surgeon: Oneil Parchment, MD;  Location: [facility] CATH LAB;  Service: Cardiovascular;  Laterality: N/A;   POLYPECTOMY  01/03/2020   Procedure: POLYPECTOMY;  Surgeon: Avram Lupita BRAVO, MD;  Location: [facility] ENDOSCOPY;  Service: Endoscopy;;   RIGHT/LEFT HEART CATH AND CORONARY ANGIOGRAPHY N/A 08/07/2018   Procedure: RIGHT/LEFT HEART CATH AND CORONARY ANGIOGRAPHY;  Surgeon: Dann Candyce RAMAN, MD;  Location: [facility] INVASIVE CV LAB;  Service: Cardiovascular;  Laterality: N/A;   RIGHT/LEFT HEART CATH AND CORONARY ANGIOGRAPHY N/A 12/25/2020   Procedure: RIGHT/LEFT HEART CATH AND CORONARY ANGIOGRAPHY;  Surgeon: Claudene Victory ORN, MD;  Location: [facility] INVASIVE CV LAB;  Service: Cardiovascular;  Laterality: N/A;   TRANSFORAMINAL LUMBAR INTERBODY FUSION W/ MIS 1 LEVEL N/A 09/24/2021   Procedure: LUMBAR FOUR-FIVE MINIMALLY INVASIVE (MIS) TRANSFORAMINAL LUMBAR INTERBODY FUSION (TLIF) WITH METRX;   Surgeon: Cheryle Debby LABOR, MD;  Location: [facility] OR;  Service: Neurosurgery;  Laterality: N/A;    Family History  Problem Relation Age of Onset   Kidney disease Mother    Diabetes Mother    Kidney cancer Mother    Heart attack Father    Asthma Sister    Anesthesia problems Sister        Kidney's did not wake up   Sarcoidosis Sister    Sarcoidosis Niece    Colon cancer Neg Hx    Colon polyps Neg Hx    Rectal cancer Neg Hx    Stomach cancer Neg Hx    Social History:  reports that he has never smoked. He has never used smokeless tobacco. He reports that he does not drink alcohol and does not use drugs.  Allergies:  Allergies  Allergen Reactions   Statins Other (See Comments)    Aggression  Pt ok to take crestor  20mg  once a week   Hydrocortisone  Nausea Only   Ranexa [Ranolazine] Other (See  Comments)    Severe weakness/near syncope after 1 dose Hallucinations    Doxycycline  Diarrhea   Hydrocodone  Nausea And Vomiting   Miralax  [Polyethylene Glycol] Nausea And Vomiting   Z-Pak Gratian.Goldmann ] Other (See Comments)    Raises blood sugar   Zaroxolyn  [Metolazone ] Other (See Comments)    Drained, no energy    Medications Prior to Admission  Medication Sig Dispense Refill   acetaminophen  (TYLENOL ) 500 MG tablet Take 500-1,000 mg by mouth daily as needed for mild pain (pain score 1-3) (pain).     albuterol  (ACCUNEB ) 1.25 MG/3ML nebulizer solution INHALE THE CONTENTS OF 1 VIAL VIA NEBULIZER EVERY 8 HOURS AS NEEDED     albuterol  (VENTOLIN  HFA) 108 (90 Base) MCG/ACT inhaler 2 puffs as needed Inhalation every 6 hrs     amLODipine  (NORVASC ) 5 MG tablet Take 1 tablet (5 mg total) by mouth daily. (Patient taking differently: Take 5 mg by mouth at bedtime.) 30 tablet 2   ascorbic acid  (VITAMIN C ) 500 MG tablet Take 1 tablet (500 mg total) by mouth 2 (two) times daily. 30 tablet 2   [Paused] clopidogrel  (PLAVIX ) 75 MG tablet Take 1 tablet (75 mg total) by mouth daily. Restart on 09/28/21  (Patient taking differently: Take 75 mg by mouth at bedtime.) 90 tablet 4   Coenzyme Q10 100 MG capsule Take 100 mg by mouth at bedtime.     ezetimibe  (ZETIA ) 10 MG tablet Take 1 tablet (10 mg total) by mouth in the morning. (Patient taking differently: Take 10 mg by mouth at bedtime.)     furosemide  (LASIX ) 80 MG tablet Take 80 mg by mouth daily as needed for edema or fluid. Take one tablet (80mg ) by mouth daily as needed     gabapentin  (NEURONTIN ) 100 MG capsule Take 100 mg by mouth at bedtime as needed (pain).     hydrALAZINE  (APRESOLINE ) 10 MG tablet Take 10 mg by mouth 2 (two) times daily.     levothyroxine  (SYNTHROID ) 75 MCG tablet Take 75 mcg by mouth in the morning.     losartan  (COZAAR ) 100 MG tablet Take 100 mg by mouth at bedtime.     montelukast  (SINGULAIR ) 10 MG tablet Take 10 mg by mouth daily as needed (allergies).     Multiple Vitamin (MULTIVITAMIN WITH MINERALS) TABS tablet Take 1 tablet by mouth daily. (Patient taking differently: Take 1 tablet by mouth at bedtime.)     nitroGLYCERIN  (NITROSTAT ) 0.4 MG SL tablet Place 0.4 mg under the tongue every 5 (five) minutes as needed for chest pain.     NOVOLIN 70/30 (70-30) 100 UNIT/ML injection Inject 30-40 Units into the skin.     Omega-3 Fatty Acids (FISH OIL) 1000 MG CAPS Take 1,000 mg by mouth at bedtime.     pantoprazole  (PROTONIX ) 40 MG tablet Take 40 mg by mouth at bedtime.     potassium chloride  (MICRO-K ) 10 MEQ CR capsule Take 20 mEq by mouth at bedtime.     predniSONE  (DELTASONE ) 10 MG tablet Take 10 mg by mouth at bedtime. Continuous course.     rosuvastatin  (CRESTOR ) 5 MG tablet Take 5 mg by mouth at bedtime.     zinc  sulfate, 50mg  elemental zinc , 220 (50 Zn) MG capsule Take 1 capsule (220 mg total) by mouth daily. (Patient taking differently: Take 220 mg by mouth at bedtime.) 30 capsule 2    Results for orders placed or performed during the hospital encounter of 11/10/23 (from the past 48 hours)  CBC  Status: Abnormal    Collection Time: 11/10/23  7:34 PM  Result Value Ref Range   WBC 17.4 (H) 4.0 - 10.5 K/uL   RBC 3.93 (L) 4.22 - 5.81 MIL/uL   Hemoglobin 12.8 (L) 13.0 - 17.0 g/dL   HCT 59.8 60.9 - 47.9 %   MCV 102.0 (H) 80.0 - 100.0 fL   MCH 32.6 26.0 - 34.0 pg   MCHC 31.9 30.0 - 36.0 g/dL   RDW 86.2 88.4 - 84.4 %   Platelets 268 150 - 400 K/uL   nRBC 0.0 0.0 - 0.2 %    Comment: Performed at [facility], [address], [place], [place] [address]  Comprehensive metabolic panel     Status: Abnormal   Collection Time: 11/10/23  7:34 PM  Result Value Ref Range   Sodium 137 135 - 145 mmol/L   Potassium 4.0 3.5 - 5.1 mmol/L   Chloride 98 98 - 111 mmol/L   CO2 29 22 - 32 mmol/L   Glucose, Bld 256 (H) 70 - 99 mg/dL    Comment: Glucose reference range applies only to samples taken after fasting for at least 8 hours.   BUN 22 8 - 23 mg/dL   Creatinine, Ser 8.50 (H) 0.61 - 1.24 mg/dL   Calcium  8.9 8.9 - 10.3 mg/dL   Total Protein 6.7 6.5 - 8.1 g/dL   Albumin 3.4 (L) 3.5 - 5.0 g/dL   AST 28 15 - 41 U/L   ALT 20 0 - 44 U/L   Alkaline Phosphatase 56 38 - 126 U/L   Total Bilirubin 1.2 0.0 - 1.2 mg/dL   GFR, Estimated 48 (L) >60 mL/min    Comment: (NOTE) Calculated using the CKD-EPI Creatinine Equation (2021)    Anion gap 10 5 - 15    Comment: Performed at [facility], [address] [place], [place] [address]  Glucose, capillary     Status: Abnormal   Collection Time: 11/10/23  8:00 PM  Result Value Ref Range   Glucose-Capillary 247 (H) 70 - 99 mg/dL    Comment: Glucose reference range applies only to samples taken after fasting for at least 8 hours.  Glucose, capillary     Status: Abnormal   Collection Time: 11/11/23  7:35 AM  Result Value Ref Range   Glucose-Capillary 298 (H) 70 - 99 mg/dL    Comment: Glucose reference range applies only to samples taken after fasting for at least 8 hours.  CBC     Status: Abnormal   Collection Time: 11/11/23  7:49 AM  Result Value Ref  Range   WBC 14.3 (H) 4.0 - 10.5 K/uL   RBC 3.98 (L) 4.22 - 5.81 MIL/uL   Hemoglobin 12.6 (L) 13.0 - 17.0 g/dL   HCT 59.2 60.9 - 47.9 %   MCV 102.3 (H) 80.0 - 100.0 fL   MCH 31.7 26.0 - 34.0 pg   MCHC 31.0 30.0 - 36.0 g/dL   RDW 86.3 88.4 - 84.4 %   Platelets 267 150 - 400 K/uL   nRBC 0.0 0.0 - 0.2 %    Comment: Performed at [facility], [address], [place], [place] [address]  Basic metabolic panel with GFR     Status: Abnormal   Collection Time: 11/11/23  7:49 AM  Result Value Ref Range   Sodium 139 135 - 145 mmol/L   Potassium 3.8 3.5 - 5.1 mmol/L   Chloride 100 98 - 111 mmol/L   CO2 32 22 -  32 mmol/L   Glucose, Bld 270 (H) 70 - 99 mg/dL    Comment: Glucose reference range applies only to samples taken after fasting for at least 8 hours.   BUN 20 8 - 23 mg/dL   Creatinine, Ser 8.71 (H) 0.61 - 1.24 mg/dL   Calcium  8.6 (L) 8.9 - 10.3 mg/dL   GFR, Estimated 58 (L) >60 mL/min    Comment: (NOTE) Calculated using the CKD-EPI Creatinine Equation (2021)    Anion gap 7 5 - 15    Comment: Performed at [facility], [address] [place], [place] [address]  Glucose, capillary     Status: Abnormal   Collection Time: 11/11/23 11:06 AM  Result Value Ref Range   Glucose-Capillary 260 (H) 70 - 99 mg/dL    Comment: Glucose reference range applies only to samples taken after fasting for at least 8 hours.   DG Chest 1 View Result Date: 11/11/2023 CLINICAL DATA:  Congestive heart failure. EXAM: CHEST  1 VIEW COMPARISON:  November 10, 2023. FINDINGS: Stable cardiomegaly. Mild right basilar subsegmental atelectasis or infiltrate is noted. Left lung is clear. Bony thorax is unremarkable. IMPRESSION: Mild right basilar subsegmental atelectasis or infiltrate. Electronically Signed   By: Lynwood Landy Raddle M.D.   On: 11/11/2023 09:10   DG Chest 1 View Result Date: 11/10/2023 CLINICAL DATA:  Pneumonia, vomiting, shortness of breath EXAM: CHEST  1 VIEW COMPARISON:  11/09/2023  FINDINGS: Cardiomegaly. Bilateral lower lobe airspace opacities, right greater than left concerning for pneumonia. No visible effusions. No acute bony abnormality. IMPRESSION: Bilateral lower lobe airspace opacities, right greater than left concerning for pneumonia. This is progressed since prior study. Electronically Signed   By: Franky Crease M.D.   On: 11/10/2023 18:12   DG Chest Portable 1 View Result Date: 11/09/2023 CLINICAL DATA:  Overdose. EXAM: PORTABLE CHEST 1 VIEW COMPARISON:  10/09/2023 FINDINGS: Stable cardiomegaly. Subsegmental atelectasis or scarring. No pulmonary edema, large pleural effusion or pneumothorax. No acute osseous findings. IMPRESSION: Stable cardiomegaly. Subsegmental atelectasis or scarring. Electronically Signed   By: Andrea Gasman M.D.   On: 11/09/2023 22:54    Psychiatric Specialty Exam:   Presentation  Mental status exam - Appearance- casual grooming byrd. Appears sedated often sleep while having conversation. Alert and oriented -only oriented to her his name and kept dosing off during conversation.  Behavior - cooperative.   Motor activity-Psychomotor retardation noted. Speech - normal rate rhythm volume and tone.  Mood-  Unable to evaluate - Pt reports depression and then denies - confused  Affect -   congruent to the mood Thought Perception-No auditory and visual hallucinations. Does not appear to be responding to internal stimuli . Thought content -ambivalent-first endorsed having suicidal thoughts and then refused. Thought process -illogical, disorganized Association limited Memory -limited Fund of knowledge -limited Attention -limited. Insight and judgment  poor Estimated Level of intellectual functioning-average. Estimated level of functioning-average.  Review of Systems  Blood pressure (!) 142/73, pulse 91, temperature (!) 100.6 F (38.1 C), resp. rate 18, height 5' 10 (1.778 m), weight 90.7 kg, SpO2 96%. Physical Exam    Assessment/Plan  78 year old gentleman who has no past psych history and has multiple medical issues presented to the hospital ED after suspected suicide attempt by overdose on tramadol.  At this time patient appears to be quite confused unable to engage in any meaningful conversation.    Diagnosis- Depression, Unspecified. -Suspected suicide attempt  Encephalopathy-appreciate hospitalist recommendation.  Will do CT scan and ABG. ?? Side  effect of tramadol OD / ?? Delirium   Pneumonia/acute hypoxic respiratory failure-on 2 L of nasal cannula.  Titrate as needed to maintain oxygen  saturations above 92.  Started on antibiotic  Tried reaching out to the patient family-unable to reach out to the family.  Will try to reach out to them again.  Assessment/Plan  1.    Safety and Monitoring:   --  Involuntary admission to inpatient psychiatric unit for safety, stabilization and treatment -- Daily contact with patient to assess and evaluate symptoms and progress in treatment -- Patient's case to be discussed in multi-disciplinary team meeting -- Observation Level : q15 minute checks -- Vital signs:  q12 hours -- Precautions: suicide   2. Psychiatric Diagnoses and Treatment: Will avoid starting any psychotropic medication at this time.  Hold off all psychotropic medication   --  The risks/benefits/side-effects/alternatives to this medication were discussed in detail with the patient and time was given for questions. The patient consents to medication trial. -- Metabolic profile and EKG monitoring obtained while on an atypical antipsychotic  (BMI: 0.00, QTC: 0.00, HbA1c: 0.00, Lipid panel: 0.00) -- Encouraged patient to participate in unit milieu and in scheduled group therapies -- Short Term Goals: Ability to identify changes in lifestyle to reduce recurrence of condition will improve, Ability to verbalize feelings will improve, Ability to disclose and discuss suicidal ideas, Ability to  demonstrate self-control will improve, Ability to identify and develop effective coping behaviors will improve, Ability to maintain clinical measurements within normal limits will improve, Compliance with prescribed medications will improve, and Ability to identify triggers associated with substance abuse/mental health issues will improve -- Long Term Goals: Improvement in symptoms so as ready for discharge        3. Medical Issues Being Addressed:    Encephalopathy-appreciate hospitalist recommendation.  Will do CT scan and ABG. Will continue to monitor closely  Pneumonia/acute hypoxic respiratory failure-on 2 L of nasal cannula.  Titrate as needed to maintain oxygen  saturations above 92.  Started on antibiotic/Augmentin . Chest x-ray-mild right basilar subsegmental atelectasis or infiltrates.  Continue home medication              4. Discharge Planning:   -- Social work and case management to assist with discharge planning and identification of hospital follow-up needs prior to discharge -- Estimated LOS: 5-7 days -- Discharge Concerns: Need to establish a safety plan; Medication compliance and effectiveness -- Discharge Goals: Return home with outpatient referrals for mental health follow-up including medication management/psychotherapy   Jaelle Campanile, MD 11/11/2023, 2:40 PM   Addendum  Around 4:00 p.m. this writer got a message from the hospital staff that patient's family is trying to reach out to us .  Patient's daughter-in-law Ms. Christina at phone [phone].   This Clinical research associate called patient's daughter-in-[name] patient's daughter-in-[name] was very angry and upset as patient was transferred to [facility].  She reports that the cardiologist from the [facility] has told the patient  amputation of his leg leading to all these depression and suicidal thoughts. Patient daughter-in-[name] demanded patient to be transferred to another facility  as she felt that we are not giving him adequate care.    She feels that yesterday the rapid response was called and felt her father hypoxia was secondary to  inadequate medical care and questioned about him later receiving Lasix .  This Clinical research associate again  assured the family that patient is being followed by hospitalist team and was seen yesterday and today by our hospitalist team and this  Clinical research associate has personally discussed care with both the hospitalist.  This Clinical research associate also discussed that we are planning to do CT scan;  ABG  as per this Clinical research associate discussion with the hospitalist team.  Patient's family still continues to feel that  patient is not getting adequate care and wanted patient to be transferred asap.

## 2023-11-11 NOTE — B[facility] Counselor (Signed)
 CSW attempted to complete PSA with the patient. Upon entry to patient's room, patient was alert, made eye contact, and answered to his name being called. As CSW started to ask PSA questions, the patient closed his eyes and would not respond any further. Per nursing report, this has been patient's presentation since admission. CSW will continue attempts to complete PSA.

## 2023-11-11 NOTE — Progress Notes (Signed)
   11/11/23 0400  Spiritual Encounters  Type of Visit Initial  Care provided to: Pt not available  Conversation partners present during encounter Nurse  Referral source Code page  Reason for visit Code  OnCall Visit Yes   Chaplain responded to rapid response page. Upon arrival patient was being cared for by medical team. Chaplain checked in with care team and no needs were present. Chaplain is available for follow up as needed.

## 2023-11-11 NOTE — Significant Event (Incomplete)
 CROSS COVER NOTE  NAME: [name] MRN: [id number] DOB : November 12, 1945 ATTENDING PHYSICIAN: Lawence Madison LABOR, MD    Date of Service   11/11/2023   HPI/Events of Note   Rapid response called secondary to twitching   Patient was transferred from geripsych unit for medical admit due to pneumonia.  Per family, patient was taken to [facility] after apparent intentional overdose of tramadol after finding out the vascular procedure on his left leg would not be enough to save the foot and would need amputation. From [facility] he was admitted to to geripsych here. Per wife, patient had a productive cough then without fever.  Rapid response was called in geripsych  9/5. PNA on xray and started on amoxicillin  for suspected aspiration pneumonia.  Due to what is described as decreased mental status chenge today with patient seeming to be quite sedated and and minimally engaged in psych discharge summary, patient transferred and admitted here to med tele floor for further work up and treatment  CT head IMPRESSION: 1. No evidence of acute intracranial abnormality. 2. Mild chronic small vessel ischemic disease.  Latest Reference Range & Units 11/11/23 07:49  Sodium 135 - 145 mmol/L 139  Potassium 3.5 - 5.1 mmol/L 3.8  Chloride 98 - 111 mmol/L 100  CO2 22 - 32 mmol/L 32  Glucose 70 - 99 mg/dL 729 (H)  BUN 8 - 23 mg/dL 20  Creatinine 9.38 - 8.75 mg/dL 8.71 (H)  Calcium  8.9 - 10.3 mg/dL 8.6 (L)  Anion gap 5 - 15  7  (H): Data is abnormally high (L): Data is abnormally low  Latest Reference Range & Units 11/11/23 15:34  pH, Ven 7.25 - 7.43  7.38  pCO2, Ven 44 - 60 mmHg 63 (H)  pO2, Ven 32 - 45 mmHg 75 (H)  Acid-Base Excess 0.0 - 2.0 mmol/L 9.7 (H)  Bicarbonate 20.0 - 28.0 mmol/L 37.3 (H)  O2 Saturation % 97.3  Patient temperature  37.0  Collection site  VENOUS  (H): Data is abnormally high  PMH hypertension (losartan , hydralazine  and amlodipine ), OSA (outpatient work up  with mixed results therefore did not qualify for home CPAP), sarcoidosis, adrenal insufficiency (daily pred), CKD 3A, diastolic CHF (most recent echo per outpatient cardiology notes EF 45-50%), CAD (proximal LAD, , PAD complex PTA 4 weeks ago, on plavix , does not tolerate aspirin )), bradycardia (cannot tolerate beta blockers), adrenal insufficiency, IDDM(novolin 70/30 2 or 3 times a day depending on intake), Hearing loss (significant), hyperlipidemia, hypothyroidism,. Anemia (not compliant currently with iron  supplementation therapy), lumbar spne DDD with radiculopathy L4-5 fusion 09/2021, chronic eczematous otitis externa, charcot foot left foot wound debridement, diabetic neuropathy, iron  deficiency anemia, heme + stools 07/2022 f/ EGD and colonoscopy planned but not followed up per chart review (on daily protonix )  Interventions   Assessment/Plan:    11/11/2023    8:28 PM 11/11/2023    6:37 PM 11/11/2023    9:45 AM  Vitals with BMI  Systolic 134 158 864  Diastolic 84 100 65  Pulse 95 99 94   TEMP                      99.2 EKG SR PVCs RBBB and LAFB not new  Neuro - hard of hearing - general weakness no focal deficits ,face symmetrical tongue midline PERRL Lungs - rhonchi thoughout; sats improved 97 on 4L Samson CV S1S2 no edema MUSC - hard brace left  foot - SKIN - DRY mucous memebranes; bruise left hip area, non painful, left foot surgical debridement with staples still intact per nurse report and pressure are on left heel   Latest Reference Range & Units 11/11/23 21:46  pH, Arterial 7.35 - 7.45  7.41  pCO2 arterial 32 - 48 mmHg 61 (H)  pO2, Arterial 83 - 108 mmHg 109 (H)  Acid-Base Excess 0.0 - 2.0 mmol/L 11.5 (H)  Bicarbonate 20.0 - 28.0 mmol/L 38.7 (H)  O2 Saturation % 99.2  Patient temperature  37.0  Collection site  RIGHT RADIAL  Allens test (pass/fail) PASS  PASS  (H): Data is abnormally high  Latest Reference Range & Units 11/11/23 20:16  Sodium 135 - 145 mmol/L 139  Potassium 3.5 -  5.1 mmol/L 3.7  Chloride 98 - 111 mmol/L 97 (L)  CO2 22 - 32 mmol/L 33 (H)  Glucose 70 - 99 mg/dL 738 (H)  BUN 8 - 23 mg/dL 18  Creatinine 9.38 - 8.75 mg/dL 8.61 (H)  Calcium  8.9 - 10.3 mg/dL 8.4 (L)  Anion gap 5 - 15  9  Magnesium  1.7 - 2.4 mg/dL 1.7  Alkaline Phosphatase 38 - 126 U/L 66  Albumin 3.5 - 5.0 g/dL 3.0 (L)  AST 15 - 41 U/L 29  ALT 0 - 44 U/L 18  Total Protein 6.5 - 8.1 g/dL 6.8  Total Bilirubin 0.0 - 1.2 mg/dL 1.2  GFR, Estimated >39 mL/min 53 (L)  (L): Data is abnormally low (H): Data is abnormally high   Latest Reference Range & Units 11/11/23 20:16 11/11/23 22:44  Troponin I (High Sensitivity) <18 ng/L 1,170 (HH) 992 (HH)  (HH): Data is critically high  Latest Reference Range & Units 11/11/23 21:59  CK Total 49 - 397 U/L 72    Latest Reference Range & Units 11/11/23 20:16  B Natriuretic Peptide 0.0 - 100.0 pg/mL 838.2 (H)  (H): Data is abnormally high  Latest Reference Range & Units 11/11/23 20:16 11/11/23 22:44  Lactic Acid, Venous 0.5 - 1.9 mmol/L 1.4 1.2    Latest Reference Range & Units 11/11/23 20:16  D-Dimer, Quant 0.00 - 0.50 ug/mL-FEU 1.48 (H)  (H): Data is abnormally high   Pneumonia Levaquin  (family preference) - monitor CK and QT Duonebs Solumedrol 40 mg IV q 12 - will need slow taper and remain on 10 mg daily secondary to sarcoidosis  Lactic normal Low grade fever +leukocytosis  Urine for strept and legionella ordered  Elevated Troponin and BNP No chest pain  initiated heparin  therapy and remains on plavix , patient cannot take ASA  Troponin downtrending Repeat EKG without without STE unchanged from prior - qtc prolongation 506 Clinical exam initially appeared dry ? Alternative source of heart strain ECHO ordered Cardiology consult per day team  Elevated D dimer  Recent vascular procedure, not on oral anticoagulation  Moderate risk per Wells score for PE - CTA chest IMPRESSION: 1. No evidence of pulmonary embolism. 2. Mild right  pleural effusion and tiny left effusion. 3. Peribronchial opacities, compatible with bronchitis/bronchiolitis.         Erminio LITTIE Cone NP [facility] Cross Cover 7pm-7am - check amion for availability Pager [phone]

## 2023-11-11 NOTE — Plan of Care (Signed)
   Problem: Education: Goal: Knowledge of General Education information will improve Description: Including pain rating scale, medication(s)/side effects and non-pharmacologic comfort measures Outcome: Progressing   Problem: Clinical Measurements: Goal: Respiratory complications will improve Outcome: Progressing   Problem: Coping: Goal: Level of anxiety will decrease Outcome: Progressing

## 2023-11-11 NOTE — Discharge Summary (Signed)
 Physician Discharge Summary Note  Patient:  [name] is an 78 y.o., male MRN:  [id number] DOB:  02-08-1946 Patient phone:  [phone] (home)  Patient address:   [address] [place] [address],    Date of Admission:  11/10/2023 Date of Discharge:  11/11/2023  Reason for Admission:   Suicide attempt via overdose on 10 to 60 tablets of tramadol.   Principal Problem: Adjustment disorder with depressed mood Discharge Diagnoses: Principal Problem:   Adjustment disorder with depressed mood Active Problems:   Hypoxia   Aspiration into airway   Past Psychiatric History:  none reported  Past Medical History:  Past Medical History:  Diagnosis Date   Adrenal insufficiency (HCC)    Allergy    Anemia    Arthritis    feet    Broken foot 09/2019   had to wore a boot. Left    CKD (chronic kidney disease), stage III (HCC)    Coronary artery disease    has stents   Coronary atherosclerosis of native coronary artery    Proximal LAD, posterior lateral stent widely patent-10/12/11   Diabetes mellitus    insulin  and pills   Hearing loss    wears hearing aids   History of blood transfusion 06/30/2016   [name] - 2 units transfused   Hyperlipidemia    Hypertension    Hypoxia 11/10/2023   OSA (obstructive sleep apnea)    uses VPAC sleep study 2 years done through [place]. Dr. Jeffrie arranged study   PVD (peripheral vascular disease) (HCC)    Sarcoidosis    Sleep apnea    uses CPAP nightly   Thyroid  disease    Type 2 diabetes mellitus [facility])     Past Surgical History:  Procedure Laterality Date   APPENDECTOMY     CATARACT EXTRACTION  2011   bilat   CHOLECYSTECTOMY  01/25/2011   Procedure: LAPAROSCOPIC CHOLECYSTECTOMY WITH INTRAOPERATIVE CHOLANGIOGRAM;  Surgeon: Redell Alm Faith, DO;  Location: [facility] OR;  Service: General;  Laterality: N/A;   COLONOSCOPY     several   COLONOSCOPY WITH PROPOFOL  N/A 01/03/2020   Procedure: COLONOSCOPY WITH PROPOFOL ;  Surgeon: Avram Lupita BRAVO, MD;  Location: [facility] ENDOSCOPY;  Service: Endoscopy;  Laterality: N/A;   CORONARY ANGIOPLASTY     most recent 11/2009   CORONARY PRESSURE/FFR STUDY N/A 01/18/2021   Procedure: INTRAVASCULAR PRESSURE WIRE/FFR STUDY;  Surgeon: Wonda Sharper, MD;  Location: [facility] INVASIVE CV LAB;  Service: Cardiovascular;  Laterality: N/A;   CORONARY STENT INTERVENTION N/A 08/07/2018   Procedure: CORONARY STENT INTERVENTION;  Surgeon: Dann Candyce RAMAN, MD;  Location: [facility] INVASIVE CV LAB;  Service: Cardiovascular;  Laterality: N/A;   CORONARY STENT INTERVENTION N/A 01/18/2021   Procedure: CORONARY STENT INTERVENTION;  Surgeon: Wonda Sharper, MD;  Location: [facility] INVASIVE CV LAB;  Service: Cardiovascular;  Laterality: N/A;   CORONARY STENT PLACEMENT  2009   in LAD and side branch PTCA   ESOPHAGOGASTRODUODENOSCOPY (EGD) WITH PROPOFOL  N/A 01/03/2020   Procedure: ESOPHAGOGASTRODUODENOSCOPY (EGD) WITH PROPOFOL ;  Surgeon: Avram Lupita BRAVO, MD;  Location: [facility] ENDOSCOPY;  Service: Endoscopy;  Laterality: N/A;   HEMOSTASIS CLIP PLACEMENT  01/03/2020   Procedure: HEMOSTASIS CLIP PLACEMENT;  Surgeon: Avram Lupita BRAVO, MD;  Location: [facility] ENDOSCOPY;  Service: Endoscopy;;   INTERCOSTAL NERVE BLOCK  2011, 06/2016   x2. lumbar spine   LEFT HEART CATHETERIZATION WITH CORONARY ANGIOGRAM Bilateral 10/12/2011   Procedure: LEFT HEART CATHETERIZATION WITH CORONARY ANGIOGRAM;  Surgeon: Oneil Jeffrie, MD;  Location: [facility]  CATH LAB;  Service: Cardiovascular;  Laterality: Bilateral;   LEFT HEART CATHETERIZATION WITH CORONARY ANGIOGRAM N/A 04/01/2014   Procedure: LEFT HEART CATHETERIZATION WITH CORONARY ANGIOGRAM;  Surgeon: Oneil Parchment, MD;  Location: [facility] CATH LAB;  Service: Cardiovascular;  Laterality: N/A;   POLYPECTOMY  01/03/2020   Procedure: POLYPECTOMY;  Surgeon: Avram Lupita BRAVO, MD;  Location: [facility] ENDOSCOPY;  Service: Endoscopy;;   RIGHT/LEFT HEART CATH AND CORONARY ANGIOGRAPHY N/A 08/07/2018   Procedure: RIGHT/LEFT HEART CATH AND CORONARY  ANGIOGRAPHY;  Surgeon: Dann Candyce RAMAN, MD;  Location: [facility] INVASIVE CV LAB;  Service: Cardiovascular;  Laterality: N/A;   RIGHT/LEFT HEART CATH AND CORONARY ANGIOGRAPHY N/A 12/25/2020   Procedure: RIGHT/LEFT HEART CATH AND CORONARY ANGIOGRAPHY;  Surgeon: Claudene Victory ORN, MD;  Location: [facility] INVASIVE CV LAB;  Service: Cardiovascular;  Laterality: N/A;   TRANSFORAMINAL LUMBAR INTERBODY FUSION W/ MIS 1 LEVEL N/A 09/24/2021   Procedure: LUMBAR FOUR-FIVE MINIMALLY INVASIVE (MIS) TRANSFORAMINAL LUMBAR INTERBODY FUSION (TLIF) WITH METRX;  Surgeon: Cheryle Debby LABOR, MD;  Location: [facility] OR;  Service: Neurosurgery;  Laterality: N/A;   Family History:  Family History  Problem Relation Age of Onset   Kidney disease Mother    Diabetes Mother    Kidney cancer Mother    Heart attack Father    Asthma Sister    Anesthesia problems Sister        Kidney's did not wake up   Sarcoidosis Sister    Sarcoidosis Niece    Colon cancer Neg Hx    Colon polyps Neg Hx    Rectal cancer Neg Hx    Stomach cancer Neg Hx    Family Psychiatric  History:  unknown. 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   Occupation: MANAGER    Employer: [employer]  Tobacco Use   Smoking status: Never   Smokeless tobacco: Never  Vaping Use   Vaping status: Never Used  Substance and Sexual Activity   Alcohol use: No   Drug use: No   Sexual activity: Not on file  Other Topics Concern   Not on file  Social History Narrative   Not on file   Social Drivers of Health   Financial Resource Strain: Low Risk  (02/08/2020)   Received from  [facility]   Overall Financial Resource Strain (CARDIA)    Difficulty of Paying Living Expenses: Not hard at all  Food Insecurity: No Food Insecurity (11/10/2023)   Hunger Vital Sign    Worried About Running Out of Food in the Last Year: Never true    Ran Out of Food in the Last Year: Never true  Transportation Needs: No Transportation Needs (11/10/2023)   PRAPARE - Administrator, Civil Service (Medical): No    Lack of Transportation (Non-Medical): No  Physical Activity: Not on file  Stress: No Stress Concern Present (10/15/2023)   Received from [facility] - Occupational Stress Questionnaire    Do you feel stress - tense, restless, nervous, or anxious, or  unable to sleep at night because your mind is troubled all the time - these days?: Not at all  Social Connections: Moderately Integrated (11/10/2023)   Social Connection and Isolation Panel    Frequency of Communication with Friends and Family: Once a week    Frequency of Social Gatherings with Friends and Family: Once a week    Attends Religious Services: 1 to 4 times per year    Active Member of [employer] or Organizations: Yes    Attends Banker Meetings: More than 4 times per year    Marital Status: Married    Hospital Course:   CORDALE MANERA is an 78 y.o. male.  With no past psych history and past medical history of  HFrEF  -ejection fraction around 45 to 50% ; CKD; DM:HLD, PVD, left midfoot ulcer and necrosis with recent surgery, pulmonary sarcoid, OSA who presents to the ED on 11/09/23 with complaint of suicide attempt.  Patient was IVC and sent to our behavioral health unit for further care.  Reportedly patient overdosed on 10 to 60 tablets of tramadol.    Patient was medically cleared by the ED team and was sent to our behavioral health unit.  While at the behavioral unit yesterday patient was hypoxic and rapid response was called.  Patient also had an episode of 1  vomiting.  Patient at that time was placed on 2 L of nasal oxygen  which increased patient oxygenation to 93- 94 % which was earlier in 80s reportedly.  Patient also received a DuoNeb treatment and was given Lasix  yesterday and was started on antibiotic therapy by hospitalist team.  Chest was also done and suspected pneumonia.    On 11/11/2023: Nurse reports that patient has been denying any suicidal thoughts to them and denying feeling depressed.  Patient on interview was quite sedated minimally engaging in the interview.  Patient kept dozing off during the conversation.  But able to tell his name.  Patient endorses that he was feeling depressed and had suicidal thoughts initially.  Then this Clinical research associate again asked the patient said that he was not depressed and not suicidal.  Patient appears confused unable to engage in any meaningful conversation today.  Appreciate our hospitalist team recommendation,   patient was started on antibiotics.   Further testing CT scan and ABG was also discussed  and potential transferred to Medicine care.   Later hospitalist team  decided to accept the patient under the care and patient was transferred to Medicine.  Psychiatry will continue to follow patient on medical floor for ongoing care.    Physical Findings: AIMS:  , ,  ,  ,  ,  ,   CIWA:    COWS:     Musculoskeletal:    Psychiatric Specialty Exam: Mental status exam - Appearance- casual grooming /dress. Appears sedated often sleep while having conversation. Alert and oriented -only oriented to her his name and kept dosing off during conversation.  Behavior - cooperative.   Motor activity-Psychomotor retardation noted. Speech - normal rate rhythm volume and tone.  Mood-  Unable to evaluate - Pt reports depression and then denies - confused  Affect -   congruent to the mood Thought Perception-No auditory and visual hallucinations. Does not appear to be responding to internal stimuli . Thought content  -ambivalent-first endorsed having suicidal thoughts and then refused. Thought process -illogical, disorganized Association limited Memory -limited Fund of knowledge -limited Attention -limited. Insight and judgment  poor Estimated Level of intellectual functioning-average.  Estimated level of functioning-average.   Physical Exam: Physical Exam ROS Blood pressure (!) 142/73, pulse 91, temperature (!) 100.6 F (38.1 C), resp. rate 18, height 5' 10 (1.778 m), weight 90.7 kg, SpO2 96%. Body mass index is 28.69 kg/m.   Social History   Tobacco Use  Smoking Status Never  Smokeless Tobacco Never   Tobacco Cessation:  N/A, patient does not currently use tobacco products   Blood Alcohol level:  Lab Results  Component Value Date   [facility] <15 11/09/2023    Metabolic Disorder Labs:  Lab Results  Component Value Date   HGBA1C 10.6 (H) 10/09/2023   MPG 258 10/09/2023   MPG 223 03/17/2022   No results found for: PROLACTIN Lab Results  Component Value Date   CHOL 87 03/18/2022   TRIG 172 (H) 03/18/2022   HDL 20 (L) 03/18/2022   CHOLHDL 4.4 03/18/2022   VLDL 34 03/18/2022   LDLCALC 33 03/18/2022   LDLCALC 76 01/26/2021    See Psychiatric Specialty Exam and Suicide Risk Assessment completed by Attending Physician prior to discharge.  Discharge destination:   other patient was transferred to inpatient medicine sitting.  Is patient on multiple antipsychotic therapies at discharge:  No   Has Patient had three or more failed trials of antipsychotic monotherapy by history:  No  Recommended Plan for Multiple Antipsychotic Therapies: NA   Allergies as of 11/11/2023       Reactions   Statins Other (See Comments)   Aggression  Pt ok to take crestor  20mg  once a week   Hydrocortisone  Nausea Only   Ranexa [ranolazine] Other (See Comments)   Severe weakness/near syncope after 1 dose Hallucinations    Doxycycline  Diarrhea   Hydrocodone  Nausea And Vomiting   Miralax   [polyethylene Glycol] Nausea And Vomiting   Z-pak [azithromycin ] Other (See Comments)   Raises blood sugar   Zaroxolyn  [metolazone ] Other (See Comments)   Drained, no energy        Medication List     TAKE these medications      Indication  acetaminophen  500 MG tablet Commonly known as: TYLENOL  Take 500-1,000 mg by mouth daily as needed for mild pain (pain score 1-3) (pain).    albuterol  1.25 MG/3ML nebulizer solution Commonly known as: ACCUNEB  INHALE THE CONTENTS OF 1 VIAL VIA NEBULIZER EVERY 8 HOURS AS NEEDED    albuterol  108 (90 Base) MCG/ACT inhaler Commonly known as: VENTOLIN  HFA 2 puffs as needed Inhalation every 6 hrs    amLODipine  5 MG tablet Commonly known as: NORVASC  Take 1 tablet (5 mg total) by mouth daily. What changed: when to take this    ascorbic acid  500 MG tablet Commonly known as: VITAMIN C  Take 1 tablet (500 mg total) by mouth 2 (two) times daily.    clopidogrel  75 MG tablet Commonly known as: PLAVIX  Take 1 tablet (75 mg total) by mouth daily. Restart on 09/28/21    Coenzyme Q10 100 MG capsule Take 100 mg by mouth at bedtime.    ezetimibe  10 MG tablet Commonly known as: ZETIA  Take 1 tablet (10 mg total) by mouth in the morning. What changed: when to take this    Fish Oil 1000 MG Caps Take 1,000 mg by mouth at bedtime.    furosemide  80 MG tablet Commonly known as: LASIX  Take 80 mg by mouth daily as needed for edema or fluid. Take one tablet (80mg ) by mouth daily as needed    gabapentin  100 MG capsule Commonly known as: NEURONTIN  Take  100 mg by mouth at bedtime as needed (pain).    hydrALAZINE  10 MG tablet Commonly known as: APRESOLINE  Take 10 mg by mouth 2 (two) times daily.    levothyroxine  75 MCG tablet Commonly known as: SYNTHROID  Take 75 mcg by mouth in the morning.    losartan  100 MG tablet Commonly known as: COZAAR  Take 100 mg by mouth at bedtime.    montelukast  10 MG tablet Commonly known as: SINGULAIR  Take 10 mg by  mouth daily as needed (allergies).    multivitamin with minerals Tabs tablet Take 1 tablet by mouth daily. What changed: when to take this    nitroGLYCERIN  0.4 MG SL tablet Commonly known as: NITROSTAT  Place 0.4 mg under the tongue every 5 (five) minutes as needed for chest pain.    NovoLIN 70/30 (70-30) 100 UNIT/ML injection Generic drug: insulin  NPH-regular Human Inject 30-40 Units into the skin.    pantoprazole  40 MG tablet Commonly known as: PROTONIX  Take 40 mg by mouth at bedtime.    potassium chloride  10 MEQ CR capsule Commonly known as: MICRO-K  Take 20 mEq by mouth at bedtime.    predniSONE  10 MG tablet Commonly known as: DELTASONE  Take 10 mg by mouth at bedtime. Continuous course.    rosuvastatin  5 MG tablet Commonly known as: CRESTOR  Take 5 mg by mouth at bedtime.    zinc  sulfate (50mg  elemental zinc ) 220 (50 Zn) MG capsule Take 1 capsule (220 mg total) by mouth daily. What changed: when to take this          Follow-up recommendations:        At this time patient was transferred from Psychiatric Services to medicine.  Psychiatry will continue to follow him.    Signed: Leonia Heatherly, MD 11/11/2023, 5:09 PM

## 2023-11-11 NOTE — Progress Notes (Signed)
 Patient was awake lying in bed at the onset of the shift, visiting with his wife at his bedside. Calm and cooperative with all aspect of care this shift. Vital signs assessed at various intervals throughout the shift, all noted WNL. Compliant with scheduled medications as ordered. Denied SI/HI/SIB. Verbally contracted for safety. Denied AH/VH. Patient requires assistance to/from the bedside commode. Gait is unsteady. Dressing and foot boot remains in place on left foot. No complaints voiced at this time. No distress noted/ reported at this time. Slept on/off throughout the night. Remains in bed resting, rise/ fall of chest noted. All orders maintained as written.

## 2023-11-11 NOTE — [facility] Suicide Risk Assessment (Signed)
 [facility] Discharge Suicide Risk Assessment   Principal Problem: Adjustment disorder with depressed mood Discharge Diagnoses: Principal Problem:   Adjustment disorder with depressed mood Active Problems:   Hypoxia   Aspiration into airway   Total Time spent with patient: 1 hour  Musculoskeletal: Psychiatric Specialty Exam:   Presentation  Mental status exam - Appearance- casual grooming /dress. Appears sedated often sleep while having conversation. Alert and oriented -only oriented to her his name Behavior - cooperative.   Motor activity-Psychomotor retardation noted. Speech - normal rate rhythm volume and tone.  Mood-  "Depressed" Affect -   congruent to the mood Thought Perception-No auditory and visual hallucinations. Does not appear to be responding to internal stimuli . Thought content -ambivalent-first endorsed having suicidal thoughts and then refused. Thought process -illogical, disorganized Association limited Memory -limited Fund of knowledge -limited Attention -limited. Insight and judgment  poor Estimated Level of intellectual functioning-average. Estimated level of functioning-average.    Physical Exam: Physical Exam ROS Blood pressure (!) 142/73, pulse 91, temperature (!) 100.6 F (38.1 C), resp. rate 18, height 5' 10 (1.778 m), weight 90.7 kg, SpO2 96%. Body mass index is 28.69 kg/m.  Mental Status Per Nursing Assessment::   On Admission:  NA  Demographic Factors:  Male and Age 78 or older  Loss Factors: Decline in physical health  Historical Factors: Impulsivity  Risk Reduction Factors:   Sense of responsibility to family, Religious beliefs about death, Positive social support, and Positive therapeutic relationship  Continued Clinical Symptoms:  Medical Diagnoses and Treatments/Surgeries  Cognitive Features That Contribute To Risk:  Closed-mindedness and Polarized thinking    Suicide Risk:  Moderate:  Frequent suicidal ideation with limited  intensity, and duration, some specificity in terms of plans, no associated intent, good self-control, limited dysphoria/symptomatology, some risk factors present, and identifiable protective factors, including available and accessible social support.    Plan Of Care/Follow-up recommendations:  Patient was transferred to the medical unit.     Desmond Chimera, MD 11/11/2023, 4:58 PM

## 2023-11-11 NOTE — H&P (Signed)
 History and Physical    Patient: [name] FMW:[id number] DOB: January 28, 1946 DOA: 11/11/2023 DOS: the patient was seen and examined on 11/11/2023 PCP: Arloa Elsie SAUNDERS, MD  Patient [facility]: [facility]  Chief Complaint: No chief complaint on file.  HPI: [name] is a 78 y.o. male with medical history significant of adrenal insufficiency, chronic kidney disease stage III, heart failure with reduced ejection fraction EF of 45 to 50%, coronary artery disease with lesion to LAD, PAD complex stenting done recently 4 weeks ago, history of beta-blockers insulin -dependent diabetes, chronic hearing loss, hyperlipidemia, hypothyroidism, iron  deficiency anemia, lumbar degenerative disc disease with radiculopathy, chronic eczema, history of Charcot foot of the left, history of diabetic neuropathy obstructive sleep apnea and sarcoidosis, essential hypertension who was admitted 2 days ago to inpatient psychiatric unit with suicide ideation.  Patient was involuntarily committed after suicide attempt for which he took 10 to 60 tablets of tramadol.  Patient was being evaluated and apparently has denied suicidal thoughts.  Today however patient became hypoxic.  Rapid response was called yesterday when he became hypoxic.  He had 1 episode of vomiting.  Was placed on 2 L of oxygen .  Also started on DuoNeb and Lasix .  Patient was started on empiric antibiotics therapy for possible aspiration pneumonia.  Workup was done including chest x-ray that suspected the pneumonia.  Due to concern for patient's multitude of medical problems he was transferred to acute medical care for management of the suspected aspiration pneumonia  Review of Systems: As mentioned in the history of present illness. All other systems reviewed and are negative. Past Medical History:  Diagnosis Date   Adrenal insufficiency (HCC)    Allergy    Anemia    Arthritis    feet    Broken foot 09/2019   had to wore a boot. Left    CKD (chronic kidney disease),  stage III (HCC)    Coronary artery disease    has stents   Coronary atherosclerosis of native coronary artery    Proximal LAD, posterior lateral stent widely patent-10/12/11   Diabetes mellitus    insulin  and pills   Hearing loss    wears hearing aids   History of blood transfusion 06/30/2016   [name] - 2 units transfused   Hyperlipidemia    Hypertension    Hypoxia 11/10/2023   OSA (obstructive sleep apnea)    uses VPAC sleep study 2 years done through [place]. Dr. Jeffrie arranged study   PVD (peripheral vascular disease) (HCC)    Sarcoidosis    Sleep apnea    uses CPAP nightly   Thyroid  disease    Type 2 diabetes mellitus [facility])    Past Surgical History:  Procedure Laterality Date   APPENDECTOMY     CATARACT EXTRACTION  2011   bilat   CHOLECYSTECTOMY  01/25/2011   Procedure: LAPAROSCOPIC CHOLECYSTECTOMY WITH INTRAOPERATIVE CHOLANGIOGRAM;  Surgeon: Redell Alm Faith, DO;  Location: [facility];  Service: General;  Laterality: N/A;   COLONOSCOPY     several   COLONOSCOPY WITH PROPOFOL  N/A 01/03/2020   Procedure: COLONOSCOPY WITH PROPOFOL ;  Surgeon: Avram Lupita BRAVO, MD;  Location: [facility] ENDOSCOPY;  Service: Endoscopy;  Laterality: N/A;   CORONARY ANGIOPLASTY     most recent 11/2009   CORONARY PRESSURE/FFR STUDY N/A 01/18/2021   Procedure: INTRAVASCULAR PRESSURE WIRE/FFR STUDY;  Surgeon: Wonda Sharper, MD;  Location: [facility] INVASIVE CV LAB;  Service: Cardiovascular;  Laterality: N/A;   CORONARY STENT INTERVENTION N/A 08/07/2018  Procedure: CORONARY STENT INTERVENTION;  Surgeon: Dann Candyce RAMAN, MD;  Location: [facility] INVASIVE CV LAB;  Service: Cardiovascular;  Laterality: N/A;   CORONARY STENT INTERVENTION N/A 01/18/2021   Procedure: CORONARY STENT INTERVENTION;  Surgeon: Wonda Sharper, MD;  Location: [facility] INVASIVE CV LAB;  Service: Cardiovascular;  Laterality: N/A;   CORONARY STENT PLACEMENT  2009   in LAD and side branch PTCA   ESOPHAGOGASTRODUODENOSCOPY (EGD) WITH PROPOFOL  N/A  01/03/2020   Procedure: ESOPHAGOGASTRODUODENOSCOPY (EGD) WITH PROPOFOL ;  Surgeon: Avram Lupita BRAVO, MD;  Location: [facility] ENDOSCOPY;  Service: Endoscopy;  Laterality: N/A;   HEMOSTASIS CLIP PLACEMENT  01/03/2020   Procedure: HEMOSTASIS CLIP PLACEMENT;  Surgeon: Avram Lupita BRAVO, MD;  Location: [facility] ENDOSCOPY;  Service: Endoscopy;;   INTERCOSTAL NERVE BLOCK  2011, 06/2016   x2. lumbar spine   LEFT HEART CATHETERIZATION WITH CORONARY ANGIOGRAM Bilateral 10/12/2011   Procedure: LEFT HEART CATHETERIZATION WITH CORONARY ANGIOGRAM;  Surgeon: Oneil Parchment, MD;  Location: [facility] CATH LAB;  Service: Cardiovascular;  Laterality: Bilateral;   LEFT HEART CATHETERIZATION WITH CORONARY ANGIOGRAM N/A 04/01/2014   Procedure: LEFT HEART CATHETERIZATION WITH CORONARY ANGIOGRAM;  Surgeon: Oneil Parchment, MD;  Location: [facility] CATH LAB;  Service: Cardiovascular;  Laterality: N/A;   POLYPECTOMY  01/03/2020   Procedure: POLYPECTOMY;  Surgeon: Avram Lupita BRAVO, MD;  Location: [facility] ENDOSCOPY;  Service: Endoscopy;;   RIGHT/LEFT HEART CATH AND CORONARY ANGIOGRAPHY N/A 08/07/2018   Procedure: RIGHT/LEFT HEART CATH AND CORONARY ANGIOGRAPHY;  Surgeon: Dann Candyce RAMAN, MD;  Location: [facility] INVASIVE CV LAB;  Service: Cardiovascular;  Laterality: N/A;   RIGHT/LEFT HEART CATH AND CORONARY ANGIOGRAPHY N/A 12/25/2020   Procedure: RIGHT/LEFT HEART CATH AND CORONARY ANGIOGRAPHY;  Surgeon: Claudene Victory ORN, MD;  Location: [facility] INVASIVE CV LAB;  Service: Cardiovascular;  Laterality: N/A;   TRANSFORAMINAL LUMBAR INTERBODY FUSION W/ MIS 1 LEVEL N/A 09/24/2021   Procedure: LUMBAR FOUR-FIVE MINIMALLY INVASIVE (MIS) TRANSFORAMINAL LUMBAR INTERBODY FUSION (TLIF) WITH METRX;  Surgeon: Cheryle Debby LABOR, MD;  Location: [facility] OR;  Service: Neurosurgery;  Laterality: N/A;   Social History:  reports that he has never smoked. He has never used smokeless tobacco. He reports that he does not drink alcohol and does not use drugs.  Allergies  Allergen Reactions   Statins Other  (See Comments)    Aggression  Pt ok to take crestor  20mg  once a week   Hydrocortisone  Nausea Only   Ranexa [Ranolazine] Other (See Comments)    Severe weakness/near syncope after 1 dose Hallucinations    Doxycycline  Diarrhea   Hydrocodone  Nausea And Vomiting   Miralax  [Polyethylene Glycol] Nausea And Vomiting   Z-Pak [Azithromycin ] Other (See Comments)    Raises blood sugar   Zaroxolyn  [Metolazone ] Other (See Comments)    Drained, no energy    Family History  Problem Relation Age of Onset   Kidney disease Mother    Diabetes Mother    Kidney cancer Mother    Heart attack Father    Asthma Sister    Anesthesia problems Sister        Kidney's did not wake up   Sarcoidosis Sister    Sarcoidosis Niece    Colon cancer Neg Hx    Colon polyps Neg Hx    Rectal cancer Neg Hx    Stomach cancer Neg Hx     Prior to Admission medications   Medication Sig Start Date End Date Taking? Authorizing Provider  acetaminophen  (TYLENOL ) 500 MG tablet Take 500-1,000 mg by mouth daily as needed for mild pain (pain  score 1-3) (pain).    [provider]  albuterol  (ACCUNEB ) 1.25 MG/3ML nebulizer solution INHALE THE CONTENTS OF 1 VIAL VIA NEBULIZER EVERY 8 HOURS AS NEEDED    [provider]  albuterol  (VENTOLIN  HFA) 108 (90 Base) MCG/ACT inhaler 2 puffs as needed Inhalation every 6 hrs 12/22/17   [provider]  amLODipine  (NORVASC ) 5 MG tablet Take 1 tablet (5 mg total) by mouth daily. Patient taking differently: Take 5 mg by mouth at bedtime. 10/14/23   Akula, Vijaya, MD  ascorbic acid  (VITAMIN C ) 500 MG tablet Take 1 tablet (500 mg total) by mouth 2 (two) times daily. 10/13/23   Cherlyn Labella, MD  clopidogrel  (PLAVIX ) 75 MG tablet Take 1 tablet (75 mg total) by mouth daily. Restart on 09/28/21 Patient taking differently: Take 75 mg by mouth at bedtime. 09/25/21   Maree, Pratik D, DO  Coenzyme Q10 100 MG capsule Take 100 mg by mouth at bedtime.    [provider]   ezetimibe  (ZETIA ) 10 MG tablet Take 1 tablet (10 mg total) by mouth in the morning. Patient taking differently: Take 10 mg by mouth at bedtime. 10/13/23   Akula, Vijaya, MD  furosemide  (LASIX ) 80 MG tablet Take 80 mg by mouth daily as needed for edema or fluid. Take one tablet (80mg ) by mouth daily as needed    [provider]  gabapentin  (NEURONTIN ) 100 MG capsule Take 100 mg by mouth at bedtime as needed (pain). 08/04/23   [provider]  hydrALAZINE  (APRESOLINE ) 10 MG tablet Take 10 mg by mouth 2 (two) times daily. 10/20/23   [provider]  levothyroxine  (SYNTHROID ) 75 MCG tablet Take 75 mcg by mouth in the morning. 02/24/21   [provider]  losartan  (COZAAR ) 100 MG tablet Take 100 mg by mouth at bedtime.    [provider]  montelukast  (SINGULAIR ) 10 MG tablet Take 10 mg by mouth daily as needed (allergies). 07/18/18   [provider]  Multiple Vitamin (MULTIVITAMIN WITH MINERALS) TABS tablet Take 1 tablet by mouth daily. Patient taking differently: Take 1 tablet by mouth at bedtime. 10/14/23   Akula, Vijaya, MD  nitroGLYCERIN  (NITROSTAT ) 0.4 MG SL tablet Place 0.4 mg under the tongue every 5 (five) minutes as needed for chest pain. 10/21/23   [provider]  NOVOLIN 70/30 (70-30) 100 UNIT/ML injection Inject 30-40 Units into the skin. 08/23/23   [provider]  Omega-3 Fatty Acids (FISH OIL) 1000 MG CAPS Take 1,000 mg by mouth at bedtime.    [provider]  pantoprazole  (PROTONIX ) 40 MG tablet Take 40 mg by mouth at bedtime. 08/10/23   [provider]  potassium chloride  (MICRO-K ) 10 MEQ CR capsule Take 20 mEq by mouth at bedtime. 08/04/23   [provider]  predniSONE  (DELTASONE ) 10 MG tablet Take 10 mg by mouth at bedtime. Continuous course.    [provider]  rosuvastatin  (CRESTOR ) 5 MG tablet Take 5 mg by mouth at bedtime. 10/21/23   [provider]  zinc  sulfate, 50mg   elemental zinc , 220 (50 Zn) MG capsule Take 1 capsule (220 mg total) by mouth daily. Patient taking differently: Take 220 mg by mouth at bedtime. 10/14/23   Cherlyn Labella, MD    Physical Exam: Vitals:   11/11/23 2047 11/11/23 2325  SpO2: 98% 98%   Constitutional: Chronically ill looking, weak, NAD, calm, comfortable Eyes: PERRL, lids and conjunctivae normal ENMT: Mucous membranes are moist. Posterior pharynx clear of any exudate or lesions.Normal  dentition.  Neck: normal, supple, no masses, no thyromegaly Respiratory: Decreased air entry mild scattered wheezes or rhonchi bilaterally normal respiratory effort. No accessory muscle use.  Cardiovascular: Regular rate and rhythm, no murmurs / rubs / gallops. No extremity edema. 2+ pedal pulses. No carotid bruits.  Abdomen: no tenderness, no masses palpated. No hepatosplenomegaly. Bowel sounds positive.  Musculoskeletal: Good range of motion, no joint swelling or tenderness, Skin: no rashes, lesions, ulcers. No induration Neurologic: CN 2-12 grossly intact. Sensation intact, DTR normal. Strength 5/5 in all 4.  Psychiatric: Withdrawn, depressed, no agitation, awake and alert  Data Reviewed:  Temperature 93, blood pressure 150/69, pulse 99, respiratory rate of 18 oxygen  sat 91% on 2 L.  White count is 14.3 hemoglobin 12.6 sodium 139 chloride 97 CO2 33 BUN 18 creatinine 1.38.  Calcium  8.4.  Glucose is 261 BNP 838.  ABG showed a pH of 7.4 pCO2 of 61 pO2 109, total CK 72 initial troponin is 1170.  Lactic acid 1.4 glucose 216 head CT without contrast is negative.  Chest x-ray showed mild right basilar atelectasis and infiltrate  Assessment and Plan:  #1 aspiration pneumonia: Patient will be admitted.  Transferred to medical service.  Initiate IV antibiotics.  I am starting Levaquin  due to patient's allergies.  May obtain blood cultures if patient spikes a fever.  Continue oxygen  and titrating down.  #2 suicidal ideation and suicide attempt: Continue  psychiatric care in consultation.  Continue close monitoring.  #3 type 2 diabetes with hyperglycemia: Check hemoglobin A1c.  We will initiate sliding scale insulin   #4 leukocytosis: Most likely secondary to anemia.  Continue close monitor  #5 coronary artery disease: Troponin is markedly elevated.  Will trend troponins.  CK is normal so more than likely not from rhabdomyolysis.  Patient will need CT angiogram to rule out PE.  Heparin  was started empirically.  Obtain echocardiogram  #6 essential hypertension: Continue blood pressure medications.  Currently on amlodipine , hydralazine   #7 pulmonary sarcoidosis: Currently on steroids.  Will continue  #8 history of adrenal insufficiency: Continue steroids.  #9 obstructive sleep apnea: On CPAP.  Continue  #10 GERD: Continue PPIs  #11 hyperlipidemia: Continue statin  #12 chronic kidney disease stage IIIa: Renal function at baseline.  Continue to monitor    Advance Care Planning:   Code Status: Full Code   Consults: Psychiatry  Family Communication: Daughter  Severity of Illness: The appropriate patient status for this patient is INPATIENT. Inpatient status is judged to be reasonable and necessary in order to provide the required intensity of service to ensure the patient's safety. The patient's presenting symptoms, physical exam findings, and initial radiographic and laboratory data in the context of their chronic comorbidities is felt to place them at high risk for further clinical deterioration. Furthermore, it is not anticipated that the patient will be medically stable for discharge from the hospital within 2 midnights of admission.   * I certify that at the point of admission it is my clinical judgment that the patient will require inpatient hospital care spanning beyond 2 midnights from the point of admission due to high intensity of service, high risk for further deterioration and high frequency of surveillance  required.*  AuthorBETHA SIM KNOLL, MD 11/11/2023 11:52 PM  For on call review [email or web address].

## 2023-11-11 NOTE — Progress Notes (Signed)
 PT Cancellation Note  Patient Details Name: [name] MRN: [id number] DOB: 05/06/1945   Cancelled Treatment:    Reason Eval/Treat Not Completed: Patient's level of consciousness Pt sleeping on arrival, difficult to rouse.  He briefly opened his eyes, stated that he normally uses a 4WW and fell back asleep.  PT woke him again and he reported he was too tired to do anything, fell back asleep.  One final attempt and he did briefly wake to essentially decline working with PT today and back to sleep.  Further attempts aborted, will try to see pt at a later date when appropriate and willing to partcipate.  [name], DPT 11/11/2023, 3:47 PM

## 2023-11-11 NOTE — Progress Notes (Addendum)
 PROGRESS NOTE    [name]  FMW:[id number] DOB: 23-Nov-1945 DOA: 11/10/2023 PCP: Arloa Elsie SAUNDERS, MD  No chief complaint on file.   Hospital Course:  [name] is a 78 y.o. male with past medical history of chronic HFrEF, DM2 , adrenal insufficiency, CKD stage III, CAD status post stents, HLD, HTN, OSA, PVD sarcoidosis, OSA, hypothyroidism, Charcot right foot was recently admitted for left foot cellulitis underwent left foot debridement 08/13 presented to the ED due to suicidal attempt by taking 10 to 16 tablets of 50 mg of tramadol.  Admitted to psychiatric unit for tramadol overdose  Patient had 1 episode of vomiting in the hallway, was found to be hypoxic 5 to 10 minutes later, tachycardic, saturating 70% on room air, rapid response was called.  Hospital medicine consulted for further management.   Subjective: Patient was examined at bedside, new to me today.  Barely interactive, did not talk or answer any questions.  Discussed with psychiatrist on the floor.  Spiked low-grade fever this morning, suspect likely due to aspiration pneumonia, requiring supplemental oxygen  Tried calling spouse, left voicemail. Called daughter-in-law Tawni, [phone] and discussed updates. Will transfer to medical floor for further management   Objective: Vitals:   11/10/23 2138 11/10/23 2203 11/11/23 0145 11/11/23 0730  BP: (!) 141/76  (!) 129/100 (!) 142/73  Pulse:   92 91  Resp:   18 18  Temp:  (!) 96 F (35.6 C) (!) 97 F (36.1 C) (!) 100.6 F (38.1 C)  TempSrc:      SpO2:   98% 96%  Weight:      Height:        Intake/Output Summary (Last 24 hours) at 11/11/2023 1030 Last data filed at 11/10/2023 2100 Gross per 24 hour  Intake --  Output 125 ml  Net -125 ml   Filed Weights   11/10/23 1937  Weight: 90.7 kg    Examination:  General: calm, comfortable, non-cooperative Neck: normal, supple, no masses, no thyromegaly Respiratory: clear to auscultation bilaterally, no wheezing, no  crackles. Normal respiratory effort Cardiovascular: Regular rate and rhythm, no murmurs / rubs / gallops. No extremity edema. 2+ pedal pulses Abdomen: no tenderness, no masses palpated. No hepatosplenomegaly. Bowel sounds positive.  Musculoskeletal: no clubbing / cyanosis. No joint deformity upper and lower extremities. Good ROM, no contractures. Normal muscle tone.  Skin: no rashes, lesions, ulcers. No induration Neurologic: CN 2-12 grossly intact. Sensation intact, DTR normal.  Muscle strength 5/5 on both sides  Assessment & Plan:  Principal Problem:   Adjustment disorder with depressed mood Active Problems:   Hypoxia   Aspiration into airway  Suicidal attempt Acute toxic encephalopathy due to tramadol overdose - s/p Narcan  in ER after suicide attempt overdose on 10-60 tabs of tramadol - VBG with pCO2 of 63 - Will rule out other etiology, CT head pending - TSH, Ammonia, UA, B12 - Avoid sedation medications - On IVC, 1:1 sitter - Psychiatry following  Acute hypoxic respiratory failure Aspiration pneumonia - Suspect aspiration acute as patient was found hypoxic after having episode of emesis.  - Leukocytosis improving, low-grade fever this morning - COVID/flu/RSV negative - CXR with bilateral lower lobe airspace opacities, R > left - Change po Augmentin  to IV Ceftriaxone , doxycycline  (reported allergy diarrhea), Qtc prolonged - wean Oxygen  as able - Duonebs prn - RVP pending, incentive spirometry - Send blood cultures  CKD stage3 - Cr inc 1.49 -> 1.28  - Monitor Cr   Chronic HFrEF CAD -  Appears to be euvolemic to mild fluid overload - Echo LVEF 45-50% with a septal hypokinesis of the mid to apical anterior/lateral segment - s/p po Lasix  80 mg daily x 2, switch back to Lasix  80mg  prn - Plavix , statin  HTN - Continue amlodipine , hydralazine , losartan   HLD - Zetia   Diabetes mellitus 2 - Hb A1c pending - ?  On insulin  Novolin 30 to 40 units daily - Lantus  8u, SSI   - Monitor blood glucose and titrate as needed  S/p left foot wound debridement Recent left foot cellulitis - Completed antibiotics - Recommend NWB left foot at all times - Follow-up with podiatry outpatient  Normocytic anemia -Monitor Hb intermittently   History of sarcoidosis - Continue prednisone  10 mg daily   Hypothyroidism Continue levothyroxine   PT/OT eval -- Transfer to medical floor for further management  DVT prophylaxis: SCD   Code Status: Full Code Disposition:  TBD  Consultants:  Treatment Team:  Consulting Physician: [facility] Team 10, MD Consulting Physician: Jerelene Critchley, MD  Procedures:  None  Antimicrobials:  Anti-infectives (From admission, onward)    Start     Dose/Rate Route Frequency Ordered Stop   11/10/23 1815  amoxicillin -clavulanate (AUGMENTIN ) 875-125 MG per tablet 1 tablet        1 tablet Oral Every 12 hours 11/10/23 1805         Data Reviewed: I have personally reviewed following labs and imaging studies CBC: Recent Labs  Lab 11/09/23 1650 11/10/23 1934 11/11/23 0749  WBC 12.6* 17.4* 14.3*  NEUTROABS 9.8*  --   --   HGB 13.9 12.8* 12.6*  HCT 42.4 40.1 40.7  MCV 99.1 102.0* 102.3*  PLT 331 268 267   Basic Metabolic Panel: Recent Labs  Lab 11/09/23 1650 11/10/23 1934 11/11/23 0749  NA 140 137 139  K 3.6 4.0 3.8  CL 100 98 100  CO2 25 29 32  GLUCOSE 186* 256* 270*  BUN 15 22 20   CREATININE 1.03 1.49* 1.28*  CALCIUM  10.1 8.9 8.6*   GFR: Estimated Creatinine Clearance: 54.8 mL/min (A) (by C-G formula based on SCr of 1.28 mg/dL (H)). Liver Function Tests: Recent Labs  Lab 11/09/23 1650 11/10/23 1934  AST 46* 28  ALT 29 20  ALKPHOS 62 56  BILITOT 1.2 1.2  PROT 7.1 6.7  ALBUMIN 3.5 3.4*   CBG: Recent Labs  Lab 11/10/23 1010 11/10/23 1210 11/10/23 1315 11/10/23 2000 11/11/23 0735  GLUCAP 237* 261* 247* 247* 298*    Recent Results (from the past 240 hours)  Resp panel by RT-PCR (RSV, Flu A&B,  Covid) Anterior Nasal Swab     Status: None   Collection Time: 11/10/23 10:55 AM   Specimen: Anterior Nasal Swab  Result Value Ref Range Status   SARS Coronavirus 2 by RT PCR NEGATIVE NEGATIVE Final   Influenza A by PCR NEGATIVE NEGATIVE Final   Influenza B by PCR NEGATIVE NEGATIVE Final    Comment: (NOTE) The Xpert Xpress SARS-CoV-2/FLU/RSV plus assay is intended as an aid in the diagnosis of influenza from Nasopharyngeal swab specimens and should not be used as a sole basis for treatment. Nasal washings and aspirates are unacceptable for Xpert Xpress SARS-CoV-2/FLU/RSV testing.  Fact Sheet for Patients: BloggerCourse.com  Fact Sheet for Healthcare Providers: SeriousBroker.it  This test is not yet approved or cleared by the [place]  FDA and has been authorized for detection and/or diagnosis of SARS-CoV-2 by FDA under an Emergency Use Authorization (EUA). This EUA will remain in effect (meaning  this test can be used) for the duration of the COVID-19 declaration under Section 564(b)(1) of the Act, 21 U.S.C. section 360bbb-3(b)(1), unless the authorization is terminated or revoked.     Resp Syncytial Virus by PCR NEGATIVE NEGATIVE Final    Comment: (NOTE) Fact Sheet for Patients: BloggerCourse.com  Fact Sheet for Healthcare Providers: SeriousBroker.it  This test is not yet approved or cleared by the [place]  FDA and has been authorized for detection and/or diagnosis of SARS-CoV-2 by FDA under an Emergency Use Authorization (EUA). This EUA will remain in effect (meaning this test can be used) for the duration of the COVID-19 declaration under Section 564(b)(1) of the Act, 21 U.S.C. section 360bbb-3(b)(1), unless the authorization is terminated or revoked.  Performed at [facility], [address], [place], [place] [address]      Radiology Studies: DG Chest  1 View Result Date: 11/11/2023 CLINICAL DATA:  Congestive heart failure. EXAM: CHEST  1 VIEW COMPARISON:  November 10, 2023. FINDINGS: Stable cardiomegaly. Mild right basilar subsegmental atelectasis or infiltrate is noted. Left lung is clear. Bony thorax is unremarkable. IMPRESSION: Mild right basilar subsegmental atelectasis or infiltrate. Electronically Signed   By: Lynwood Landy Raddle M.D.   On: 11/11/2023 09:10   DG Chest 1 View Result Date: 11/10/2023 CLINICAL DATA:  Pneumonia, vomiting, shortness of breath EXAM: CHEST  1 VIEW COMPARISON:  11/09/2023 FINDINGS: Cardiomegaly. Bilateral lower lobe airspace opacities, right greater than left concerning for pneumonia. No visible effusions. No acute bony abnormality. IMPRESSION: Bilateral lower lobe airspace opacities, right greater than left concerning for pneumonia. This is progressed since prior study. Electronically Signed   By: Franky Crease M.D.   On: 11/10/2023 18:12   DG Chest Portable 1 View Result Date: 11/09/2023 CLINICAL DATA:  Overdose. EXAM: PORTABLE CHEST 1 VIEW COMPARISON:  10/09/2023 FINDINGS: Stable cardiomegaly. Subsegmental atelectasis or scarring. No pulmonary edema, large pleural effusion or pneumothorax. No acute osseous findings. IMPRESSION: Stable cardiomegaly. Subsegmental atelectasis or scarring. Electronically Signed   By: Andrea Gasman M.D.   On: 11/09/2023 22:54    Scheduled Meds:  amLODipine   5 mg Oral QHS   amoxicillin -clavulanate  1 tablet Oral Q12H   ascorbic acid   500 mg Oral BID   clopidogrel   75 mg Oral QHS   ezetimibe   10 mg Oral QHS   hydrALAZINE   10 mg Oral BID   insulin  aspart  0-15 Units Subcutaneous TID WC   insulin  aspart  0-5 Units Subcutaneous QHS   ipratropium-albuterol   3 mL Nebulization Q6H   levothyroxine   75 mcg Oral q AM   losartan   100 mg Oral QHS   ondansetron   4 mg Oral Q12H   pantoprazole   40 mg Oral QHS   rosuvastatin   5 mg Oral QHS   Continuous Infusions:   LOS: 1 day  MDM: Patient is  high risk for one or more organ failure.  They necessitate ongoing hospitalization for continued IV therapies and subsequent lab monitoring. Total time spent interpreting labs and vitals, reviewing the medical record, coordinating care amongst consultants and care team members, directly assessing and discussing care with the patient and/or family: 85 min  Laree Lock, MD [facility]  To contact the attending physician between 7A-7P please use Epic Chat. To contact the covering physician during after hours 7P-7A, please review Amion.  11/11/2023, 10:30 AM   *This document has been created with the assistance of dictation software. Please excuse typographical errors. *

## 2023-11-11 NOTE — Progress Notes (Signed)
   11/11/23 1100  Psych Admission Type (Psych Patients Only)  Admission Status Involuntary  Psychosocial Assessment  Patient Complaints None  Eye Contact Fair  Facial Expression Worried  Affect Appropriate to circumstance  Speech Slow  Interaction Assertive  Motor Activity Other (Comment) (unable to get up)  Appearance/Hygiene In scrubs  Behavior Characteristics Cooperative;Appropriate to situation  Mood Sullen  Thought Process  Coherency WDL  Content WDL  Delusions None reported or observed  Perception WDL  Hallucination None reported or observed  Judgment WDL  Confusion Mild  Danger to Self  Current suicidal ideation? Denies  Danger to Others  Danger to Others None reported or observed

## 2023-11-11 NOTE — Progress Notes (Signed)
  Chaplain On-Call responded to Rapid Response notification at 1947 hours.  The patient was receiving bedside care from the medical team. Family members were receiving a reassuring update from Pensions consultant.  No spiritual needs were expressed. Chaplain assured Staff of availability as needed.  Chaplain Bebe Ardean EMERSON Hershal., [facility]

## 2023-11-11 NOTE — Progress Notes (Signed)
 Patients son called with a very nasty attitude and also began cursing at this nurse. He stated that he wants his dad somewhere [facility] safe and that he doesn't feel like his father is in a place where he is safe.

## 2023-11-11 NOTE — Group Note (Signed)
 Date:  11/11/2023 Time:  10:39 AM  Group Topic/Focus:  Goals Group:   The focus of this group is to help patients establish daily goals to achieve during treatment and discuss how the patient can incorporate goal setting into their daily lives to aide in recovery. We also talked about healthy eating and healthy lifestyle choices.     Participation Level:  Did Not Attend  Participation Quality:    Affect:    Cognitive:    Insight:   Engagement in Group:    Modes of Intervention:    Additional Comments:    [name] 11/11/2023, 10:39 AM

## 2023-11-11 NOTE — Progress Notes (Signed)
 PHARMACY - ANTICOAGULATION CONSULT NOTE  Pharmacy Consult for Heparin  Infusion Indication: chest pain/ACS  Allergies  Allergen Reactions   Statins Other (See Comments)    Aggression  Pt ok to take crestor  20mg  once a week   Hydrocortisone  Nausea Only   Ranexa [Ranolazine] Other (See Comments)    Severe weakness/near syncope after 1 dose Hallucinations    Doxycycline  Diarrhea   Hydrocodone  Nausea And Vomiting   Miralax  [Polyethylene Glycol] Nausea And Vomiting   Z-Pak [Azithromycin ] Other (See Comments)    Raises blood sugar   Zaroxolyn  [Metolazone ] Other (See Comments)    Drained, no energy    Patient Measurements:    Vital Signs: Temp: 99.1 F (37.3 C) (09/06 2028) Temp Source: Temporal (09/06 1837) BP: 134/84 (09/06 2028) Pulse Rate: 95 (09/06 2028)  Labs: Recent Labs    11/09/23 1650 11/10/23 1934 11/11/23 0749 11/11/23 2016  HGB 13.9 12.8* 12.6*  --   HCT 42.4 40.1 40.7  --   PLT 331 268 267  --   CREATININE 1.03 1.49* 1.28* 1.38*  TROPONINIHS  --   --   --  1,170*    Estimated Creatinine Clearance: 50.8 mL/min (A) (by C-G formula based on SCr of 1.38 mg/dL (H)).   Medical History: Past Medical History:  Diagnosis Date   Adrenal insufficiency (HCC)    Allergy    Anemia    Arthritis    feet    Broken foot 09/2019   had to wore a boot. Left    CKD (chronic kidney disease), stage III (HCC)    Coronary artery disease    has stents   Coronary atherosclerosis of native coronary artery    Proximal LAD, posterior lateral stent widely patent-10/12/11   Diabetes mellitus    insulin  and pills   Hearing loss    wears hearing aids   History of blood transfusion 06/30/2016   [name] - 2 units transfused   Hyperlipidemia    Hypertension    Hypoxia 11/10/2023   OSA (obstructive sleep apnea)    uses VPAC sleep study 2 years done through [facility]. Dr. Jeffrie arranged study   PVD (peripheral vascular disease) (HCC)    Sarcoidosis    Sleep apnea    uses  CPAP nightly   Thyroid  disease    Type 2 diabetes mellitus [facility])    Assessment: Patient is a 78yo male admitted from Behavioral Medicine Unit. Patient with elevated troponin. Pharmacy consulted for Heparin  dosing. No prior anticoagulants noted in history.  Goal of Therapy:  Heparin  level 0.3-0.7 units/ml Monitor platelets by anticoagulation protocol: Yes   Plan:  Give 4000 units bolus x 1 Start heparin  infusion at 1200 units/hr Check anti-Xa level in 8 hours and daily while on heparin  Continue to monitor H&H and platelets  Olam Fritter, PharmD, BCPS 11/11/2023 9:40 PM

## 2023-11-12 ENCOUNTER — Inpatient Hospital Stay (HOSPITAL_COMMUNITY)
Admission: AD | Admit: 2023-11-12 | Discharge: 2023-11-12 | Disposition: A | Discharge status: Home / Self Care | Source: Intra-hospital | Attending: Acute Care | Admitting: Acute Care

## 2023-11-12 ENCOUNTER — Inpatient Hospital Stay

## 2023-11-12 DIAGNOSIS — L8990 Pressure ulcer of unspecified site, unspecified stage: Secondary | ICD-10-CM | POA: Insufficient documentation

## 2023-11-12 DIAGNOSIS — I25118 Atherosclerotic heart disease of native coronary artery with other forms of angina pectoris: Secondary | ICD-10-CM | POA: Diagnosis not present

## 2023-11-12 DIAGNOSIS — E785 Hyperlipidemia, unspecified: Secondary | ICD-10-CM | POA: Diagnosis not present

## 2023-11-12 DIAGNOSIS — I493 Ventricular premature depolarization: Secondary | ICD-10-CM

## 2023-11-12 DIAGNOSIS — J90 Pleural effusion, not elsewhere classified: Secondary | ICD-10-CM | POA: Diagnosis not present

## 2023-11-12 DIAGNOSIS — I5020 Unspecified systolic (congestive) heart failure: Secondary | ICD-10-CM | POA: Diagnosis not present

## 2023-11-12 DIAGNOSIS — I214 Non-ST elevation (NSTEMI) myocardial infarction: Secondary | ICD-10-CM

## 2023-11-12 DIAGNOSIS — R918 Other nonspecific abnormal finding of lung field: Secondary | ICD-10-CM | POA: Diagnosis not present

## 2023-11-12 DIAGNOSIS — I2510 Atherosclerotic heart disease of native coronary artery without angina pectoris: Secondary | ICD-10-CM

## 2023-11-12 DIAGNOSIS — R7989 Other specified abnormal findings of blood chemistry: Secondary | ICD-10-CM | POA: Diagnosis not present

## 2023-11-12 DIAGNOSIS — I10 Essential (primary) hypertension: Secondary | ICD-10-CM

## 2023-11-12 DIAGNOSIS — J690 Pneumonitis due to inhalation of food and vomit: Secondary | ICD-10-CM | POA: Diagnosis not present

## 2023-11-12 LAB — CBC
HCT: 41 % (ref 39.0–52.0)
Hemoglobin: 12.9 g/dL — ABNORMAL LOW (ref 13.0–17.0)
MCH: 32.3 pg (ref 26.0–34.0)
MCHC: 31.5 g/dL (ref 30.0–36.0)
MCV: 102.5 fL — ABNORMAL HIGH (ref 80.0–100.0)
Platelets: 269 K/uL (ref 150–400)
RBC: 4 MIL/uL — ABNORMAL LOW (ref 4.22–5.81)
RDW: 13.2 % (ref 11.5–15.5)
WBC: 14.3 K/uL — ABNORMAL HIGH (ref 4.0–10.5)
nRBC: 0 % (ref 0.0–0.2)

## 2023-11-12 LAB — GLUCOSE, CAPILLARY
Glucose-Capillary: 208 mg/dL — ABNORMAL HIGH (ref 70–99)
Glucose-Capillary: 236 mg/dL — ABNORMAL HIGH (ref 70–99)
Glucose-Capillary: 234 mg/dL — ABNORMAL HIGH (ref 70–99)
Glucose-Capillary: 273 mg/dL — ABNORMAL HIGH (ref 70–99)
Glucose-Capillary: 230 mg/dL — ABNORMAL HIGH (ref 70–99)
Glucose-Capillary: 300 mg/dL — ABNORMAL HIGH (ref 70–99)
Glucose-Capillary: 276 mg/dL — ABNORMAL HIGH (ref 70–99)

## 2023-11-12 LAB — ECHOCARDIOGRAM COMPLETE
Area-P 1/2: 5.62 cm2
S' Lateral: 4.5 cm

## 2023-11-12 LAB — COMPREHENSIVE METABOLIC PANEL WITH GFR
ALT: 20 U/L (ref 0–44)
AST: 28 U/L (ref 15–41)
Albumin: 3 g/dL — ABNORMAL LOW (ref 3.5–5.0)
Alkaline Phosphatase: 69 U/L (ref 38–126)
Anion gap: 11 (ref 5–15)
BUN: 16 mg/dL (ref 8–23)
CO2: 32 mmol/L (ref 22–32)
Calcium: 8.4 mg/dL — ABNORMAL LOW (ref 8.9–10.3)
Chloride: 96 mmol/L — ABNORMAL LOW (ref 98–111)
Creatinine, Ser: 1.33 mg/dL — ABNORMAL HIGH (ref 0.61–1.24)
GFR, Estimated: 55 mL/min — ABNORMAL LOW (ref >60)
Glucose, Bld: 237 mg/dL — ABNORMAL HIGH (ref 70–99)
Potassium: 3.8 mmol/L (ref 3.5–5.1)
Sodium: 139 mmol/L (ref 135–145)
Total Bilirubin: 1.4 mg/dL — ABNORMAL HIGH (ref 0.0–1.2)
Total Protein: 7 g/dL (ref 6.5–8.1)

## 2023-11-12 LAB — URINALYSIS, ROUTINE W REFLEX MICROSCOPIC
Bilirubin Urine: NEGATIVE
Glucose, UA: 50 mg/dL — AB
Hgb urine dipstick: NEGATIVE
Ketones, ur: NEGATIVE mg/dL
Leukocytes,Ua: NEGATIVE
Nitrite: NEGATIVE
Protein, ur: NEGATIVE mg/dL
Specific Gravity, Urine: 1.009 (ref 1.005–1.030)
pH: 5 (ref 5.0–8.0)

## 2023-11-12 LAB — HEPARIN LEVEL (UNFRACTIONATED)
Heparin Unfractionated: 0.19 [IU]/mL — ABNORMAL LOW (ref 0.30–0.70)
Heparin Unfractionated: 0.57 [IU]/mL (ref 0.30–0.70)

## 2023-11-12 LAB — STREP PNEUMONIAE URINARY ANTIGEN: Strep Pneumo Urinary Antigen: NEGATIVE

## 2023-11-12 LAB — MAGNESIUM: Magnesium: 2 mg/dL (ref 1.7–2.4)

## 2023-11-12 LAB — CORTISOL: Cortisol, Plasma: 10.3 ug/dL

## 2023-11-12 LAB — D-DIMER, QUANTITATIVE: D-Dimer, Quant: 1.48 ug{FEU}/mL — ABNORMAL HIGH (ref 0.00–0.50)

## 2023-11-12 LAB — PHOSPHORUS
Phosphorus: 1.2 mg/dL — ABNORMAL LOW (ref 2.5–4.6)
Phosphorus: 2.5 mg/dL (ref 2.5–4.6)

## 2023-11-12 LAB — HIV ANTIBODY (ROUTINE TESTING W REFLEX): HIV Screen 4th Generation wRfx: NONREACTIVE

## 2023-11-12 LAB — TROPONIN I (HIGH SENSITIVITY): Troponin I (High Sensitivity): 753 ng/L (ref <18)

## 2023-11-12 MED ORDER — ASPIRIN 81 MG PO TBEC
81.0000 mg | DELAYED_RELEASE_TABLET | Freq: Every day | ORAL | Status: DC
  Administered 2023-11-12 – 2023-11-18 (×6): 81 mg via ORAL
  Filled 2023-11-12 (×8): qty 1

## 2023-11-12 MED ORDER — ISOSORBIDE MONONITRATE ER 30 MG PO TB24
30.0000 mg | ORAL_TABLET | Freq: Every day | ORAL | Status: DC
  Administered 2023-11-12 – 2023-11-18 (×7): 30 mg via ORAL
  Filled 2023-11-12 (×8): qty 1

## 2023-11-12 MED ORDER — IOHEXOL 350 MG/ML SOLN
75.0000 mL | Freq: Once | INTRAVENOUS | Status: AC | PRN
  Administered 2023-11-12: 75 mL via INTRAVENOUS

## 2023-11-12 MED ORDER — POTASSIUM PHOSPHATES 15 MMOLE/5ML IV SOLN
30.0000 mmol | Freq: Once | INTRAVENOUS | Status: AC
  Administered 2023-11-12: 30 mmol via INTRAVENOUS
  Filled 2023-11-12: qty 10

## 2023-11-12 MED ORDER — ROSUVASTATIN CALCIUM 5 MG PO TABS
5.0000 mg | ORAL_TABLET | Freq: Every day | ORAL | Status: DC
  Administered 2023-11-12 – 2023-11-17 (×6): 5 mg via ORAL
  Filled 2023-11-12 (×6): qty 1

## 2023-11-12 MED ORDER — ALBUTEROL SULFATE (2.5 MG/3ML) 0.083% IN NEBU
2.5000 mg | INHALATION_SOLUTION | Freq: Four times a day (QID) | RESPIRATORY_TRACT | Status: DC | PRN
  Administered 2023-11-13: 2.5 mg via RESPIRATORY_TRACT
  Filled 2023-11-12 (×2): qty 3

## 2023-11-12 MED ORDER — METOPROLOL SUCCINATE ER 25 MG PO TB24
25.0000 mg | ORAL_TABLET | Freq: Every day | ORAL | Status: DC
  Administered 2023-11-13 – 2023-11-18 (×5): 25 mg via ORAL
  Filled 2023-11-12 (×7): qty 1

## 2023-11-12 MED ORDER — IPRATROPIUM-ALBUTEROL 0.5-2.5 (3) MG/3ML IN SOLN
3.0000 mL | Freq: Two times a day (BID) | RESPIRATORY_TRACT | Status: DC
  Administered 2023-11-12: 3 mL via RESPIRATORY_TRACT
  Filled 2023-11-12: qty 3

## 2023-11-12 MED ORDER — HEPARIN BOLUS VIA INFUSION
2700.0000 [IU] | Freq: Once | INTRAVENOUS | Status: AC
  Administered 2023-11-12: 2700 [IU] via INTRAVENOUS
  Filled 2023-11-12: qty 2700

## 2023-11-12 MED ORDER — EZETIMIBE 10 MG PO TABS
10.0000 mg | ORAL_TABLET | Freq: Every day | ORAL | Status: DC
  Administered 2023-11-12 – 2023-11-18 (×7): 10 mg via ORAL
  Filled 2023-11-12 (×9): qty 1

## 2023-11-12 MED ORDER — PERFLUTREN LIPID MICROSPHERE
1.0000 mL | INTRAVENOUS | Status: AC | PRN
  Administered 2023-11-12: 3 mL via INTRAVENOUS

## 2023-11-12 MED ORDER — PREDNISONE 10 MG PO TABS
10.0000 mg | ORAL_TABLET | Freq: Every day | ORAL | Status: DC
  Administered 2023-11-12 – 2023-11-17 (×6): 10 mg via ORAL
  Filled 2023-11-12 (×6): qty 1

## 2023-11-12 MED ORDER — SACUBITRIL-VALSARTAN 24-26 MG PO TABS
1.0000 | ORAL_TABLET | Freq: Two times a day (BID) | ORAL | Status: DC
  Administered 2023-11-13 – 2023-11-18 (×10): 1 via ORAL
  Filled 2023-11-12 (×11): qty 1

## 2023-11-12 MED ORDER — MONTELUKAST SODIUM 10 MG PO TABS: 10.0000 mg | ORAL_TABLET | Freq: Every day | ORAL | Status: DC | PRN

## 2023-11-12 MED ORDER — INSULIN GLARGINE 100 UNIT/ML ~~LOC~~ SOLN
5.0000 [IU] | Freq: Every day | SUBCUTANEOUS | Status: DC
  Administered 2023-11-12 – 2023-11-13 (×2): 5 [IU] via SUBCUTANEOUS
  Filled 2023-11-12 (×2): qty 0.05

## 2023-11-12 NOTE — Consult Note (Signed)
 Cardiology Consultation   Patient ID: [name] MRN: [id number]; DOB: [birth year]  Admit date: 11/11/2023 Date of Consult: 11/12/2023  PCP:  Arloa Elsie SAUNDERS, MD   [facility] Providers Cardiologist:  Oneil Parchment, MD      Patient Profile: [name] is a 78 y.o. male with a hx of CAD s/p multiple PCIs , HFmrEF, pulmonary sarcoidosis, HNT, PAD, HLD, adrenal insufficiency on chronic prednisone , chronic hearing loss, anemia with prior GI bleed, CKD stage who is being seen 11/12/2023 for the evaluation of elevated troponin at the request of Dr. Jerelene.  History of Present Illness: [name] has a h/o CAD with DES to LAD and PLA in 2011, DESx2 right posterior dedscending artery 2020, DES to pRCA mRCA and 1st diag 01/2021. Most recent LHC 08/2021 at [facility] showed nonobstructive CAD and no interventions were performed.   H/o HFpEF with echo dating back to 2015 showing LVEF 55-60%. Heart monitor in 10/2021 showed frequent PVCS, 8.3% burden and toprol  was started. Echo 03/2022 showed LVEF 50-55%. Echo 07/2022 showed LVEF 55-60%, G1DD.   The patient was last seen 06/2022 and was stable from a cardiac perspective. He then switched to [facility] cardiology. Echo 10/2023 showed LVEF 45-50%, subtle HK of mid to apical anterior/lat segments. This was in the setting of admission for cellulitis of the foot and PAD. Plan was for stress test if angina developed.   The patient was admitted 11/10/23 for suicide ideation. He was involuntarily committed after suicide attempt for which he took 10-60 tablets of tramadol. On my interview, he said it was an accident.  On 9/6 the patient became hypoxic and rapid response was called. He had 1 episode fo vomiting and was placed on 2L O2. He was given duonebs and lasix  and started on antibiotics. CXR included suspected PNA. He was transferred to acute medical care.   HS trop peaked to 1170 and was he was started on IV heparin .EKG showed NSR with PVCs, no ischemic changes.  Cardiology was asked to see. No chest pain was reported. Patient is not functional at baseline, he is practically wheelchair bound.   HS trop 107>[id number]>992>753 Scr 1.33, BUN 16 Albumin 3 WBC 14.3 Hgb 12.9   Past Medical History:  Diagnosis Date   Adrenal insufficiency (HCC)    Allergy    Anemia    Arthritis    feet    Broken foot 09/2019   had to wore a boot. Left    CKD (chronic kidney disease), stage III (HCC)    Coronary artery disease    has stents   Coronary atherosclerosis of native coronary artery    Proximal LAD, posterior lateral stent widely patent-10/12/11   Diabetes mellitus    insulin  and pills   Hearing loss    wears hearing aids   History of blood transfusion 06/30/2016   [name] - 2 units transfused   Hyperlipidemia    Hypertension    Hypoxia 11/10/2023   OSA (obstructive sleep apnea)    uses VPAC sleep study 2 years done through [place]. Dr. Parchment arranged study   PVD (peripheral vascular disease) (HCC)    Sarcoidosis    Sleep apnea    uses CPAP nightly   Thyroid  disease    Type 2 diabetes mellitus [facility])     Past Surgical History:  Procedure Laterality Date   APPENDECTOMY     CATARACT EXTRACTION  2011   bilat   CHOLECYSTECTOMY  01/25/2011   Procedure: LAPAROSCOPIC CHOLECYSTECTOMY WITH  INTRAOPERATIVE CHOLANGIOGRAM;  Surgeon: Redell Alm Faith, DO;  Location: [facility] OR;  Service: General;  Laterality: N/A;   COLONOSCOPY     several   COLONOSCOPY WITH PROPOFOL  N/A 01/03/2020   Procedure: COLONOSCOPY WITH PROPOFOL ;  Surgeon: Avram Lupita BRAVO, MD;  Location: [facility] ENDOSCOPY;  Service: Endoscopy;  Laterality: N/A;   CORONARY ANGIOPLASTY     most recent 11/2009   CORONARY PRESSURE/FFR STUDY N/A 01/18/2021   Procedure: INTRAVASCULAR PRESSURE WIRE/FFR STUDY;  Surgeon: Wonda Sharper, MD;  Location: [facility] INVASIVE CV LAB;  Service: Cardiovascular;  Laterality: N/A;   CORONARY STENT INTERVENTION N/A 08/07/2018   Procedure: CORONARY STENT INTERVENTION;  Surgeon:  Dann Candyce RAMAN, MD;  Location: [facility] INVASIVE CV LAB;  Service: Cardiovascular;  Laterality: N/A;   CORONARY STENT INTERVENTION N/A 01/18/2021   Procedure: CORONARY STENT INTERVENTION;  Surgeon: Wonda Sharper, MD;  Location: [facility] INVASIVE CV LAB;  Service: Cardiovascular;  Laterality: N/A;   CORONARY STENT PLACEMENT  2009   in LAD and side branch PTCA   ESOPHAGOGASTRODUODENOSCOPY (EGD) WITH PROPOFOL  N/A 01/03/2020   Procedure: ESOPHAGOGASTRODUODENOSCOPY (EGD) WITH PROPOFOL ;  Surgeon: Avram Lupita BRAVO, MD;  Location: [facility] ENDOSCOPY;  Service: Endoscopy;  Laterality: N/A;   HEMOSTASIS CLIP PLACEMENT  01/03/2020   Procedure: HEMOSTASIS CLIP PLACEMENT;  Surgeon: Avram Lupita BRAVO, MD;  Location: [facility]OSCOPY;  Service: Endoscopy;;   INTERCOSTAL NERVE BLOCK  2011, 06/2016   x2. lumbar spine   LEFT HEART CATHETERIZATION WITH CORONARY ANGIOGRAM Bilateral 10/12/2011   Procedure: LEFT HEART CATHETERIZATION WITH CORONARY ANGIOGRAM;  Surgeon: Oneil Parchment, MD;  Location: [facility] CATH LAB;  Service: Cardiovascular;  Laterality: Bilateral;   LEFT HEART CATHETERIZATION WITH CORONARY ANGIOGRAM N/A 04/01/2014   Procedure: LEFT HEART CATHETERIZATION WITH CORONARY ANGIOGRAM;  Surgeon: Oneil Parchment, MD;  Location: [facility] CATH LAB;  Service: Cardiovascular;  Laterality: N/A;   POLYPECTOMY  01/03/2020   Procedure: POLYPECTOMY;  Surgeon: Avram Lupita BRAVO, MD;  Location: [facility] ENDOSCOPY;  Service: Endoscopy;;   RIGHT/LEFT HEART CATH AND CORONARY ANGIOGRAPHY N/A 08/07/2018   Procedure: RIGHT/LEFT HEART CATH AND CORONARY ANGIOGRAPHY;  Surgeon: Dann Candyce RAMAN, MD;  Location: [facility] INVASIVE CV LAB;  Service: Cardiovascular;  Laterality: N/A;   RIGHT/LEFT HEART CATH AND CORONARY ANGIOGRAPHY N/A 12/25/2020   Procedure: RIGHT/LEFT HEART CATH AND CORONARY ANGIOGRAPHY;  Surgeon: Claudene Victory ORN, MD;  Location: [facility] INVASIVE CV LAB;  Service: Cardiovascular;  Laterality: N/A;   TRANSFORAMINAL LUMBAR INTERBODY FUSION W/ MIS 1 LEVEL N/A 09/24/2021    Procedure: LUMBAR FOUR-FIVE MINIMALLY INVASIVE (MIS) TRANSFORAMINAL LUMBAR INTERBODY FUSION (TLIF) WITH METRX;  Surgeon: Cheryle Debby LABOR, MD;  Location: [facility] OR;  Service: Neurosurgery;  Laterality: N/A;     Home Medications:  Prior to Admission medications   Medication Sig Start Date End Date Taking? Authorizing Provider  acetaminophen  (TYLENOL ) 500 MG tablet Take 500-1,000 mg by mouth daily as needed for mild pain (pain score 1-3) (pain).    [provider]  albuterol  (ACCUNEB ) 1.25 MG/3ML nebulizer solution INHALE THE CONTENTS OF 1 VIAL VIA NEBULIZER EVERY 8 HOURS AS NEEDED    [provider]  albuterol  (VENTOLIN  HFA) 108 (90 Base) MCG/ACT inhaler 2 puffs as needed Inhalation every 6 hrs 12/22/17   [provider]  amLODipine  (NORVASC ) 5 MG tablet Take 1 tablet (5 mg total) by mouth daily. Patient taking differently: Take 5 mg by mouth at bedtime. 10/14/23   Akula, Vijaya, MD  ascorbic acid  (VITAMIN C ) 500 MG tablet Take 1 tablet (500 mg total) by mouth 2 (  two) times daily. 10/13/23   Akula, Vijaya, MD  clopidogrel  (PLAVIX ) 75 MG tablet Take 1 tablet (75 mg total) by mouth daily. Restart on 09/28/21 Patient taking differently: Take 75 mg by mouth at bedtime. 09/25/21   Maree, Pratik D, DO  Coenzyme Q10 100 MG capsule Take 100 mg by mouth at bedtime.    [provider]  ezetimibe  (ZETIA ) 10 MG tablet Take 1 tablet (10 mg total) by mouth in the morning. Patient taking differently: Take 10 mg by mouth at bedtime. 10/13/23   Akula, Vijaya, MD  furosemide  (LASIX ) 80 MG tablet Take 80 mg by mouth daily as needed for edema or fluid. Take one tablet (80mg ) by mouth daily as needed    [provider]  gabapentin  (NEURONTIN ) 100 MG capsule Take 100 mg by mouth at bedtime as needed (pain). 08/04/23   [provider]  hydrALAZINE  (APRESOLINE ) 10 MG tablet Take 10 mg by mouth 2 (two) times daily. 10/20/23   [provider]  levothyroxine  (SYNTHROID ) 75  MCG tablet Take 75 mcg by mouth in the morning. 02/24/21   [provider]  losartan  (COZAAR ) 100 MG tablet Take 100 mg by mouth at bedtime.    [provider]  montelukast  (SINGULAIR ) 10 MG tablet Take 10 mg by mouth daily as needed (allergies). 07/18/18   [provider]  Multiple Vitamin (MULTIVITAMIN WITH MINERALS) TABS tablet Take 1 tablet by mouth daily. Patient taking differently: Take 1 tablet by mouth at bedtime. 10/14/23   Akula, Vijaya, MD  nitroGLYCERIN  (NITROSTAT ) 0.4 MG SL tablet Place 0.4 mg under the tongue every 5 (five) minutes as needed for chest pain. 10/21/23   [provider]  NOVOLIN 70/30 (70-30) 100 UNIT/ML injection Inject 30-40 Units into the skin. 08/23/23   [provider]  Omega-3 Fatty Acids (FISH OIL) 1000 MG CAPS Take 1,000 mg by mouth at bedtime.    [provider]  pantoprazole  (PROTONIX ) 40 MG tablet Take 40 mg by mouth at bedtime. 08/10/23   [provider]  potassium chloride  (MICRO-K ) 10 MEQ CR capsule Take 20 mEq by mouth at bedtime. 08/04/23   [provider]  predniSONE  (DELTASONE ) 10 MG tablet Take 10 mg by mouth at bedtime. Continuous course.    [provider]  rosuvastatin  (CRESTOR ) 5 MG tablet Take 5 mg by mouth at bedtime. 10/21/23   [provider]  zinc  sulfate, 50mg  elemental zinc , 220 (50 Zn) MG capsule Take 1 capsule (220 mg total) by mouth daily. Patient taking differently: Take 220 mg by mouth at bedtime. 10/14/23   Akula, Vijaya, MD    Scheduled Meds:  amLODipine   5 mg Oral Daily   clopidogrel   75 mg Oral Daily   ezetimibe   10 mg Oral Daily   guaiFENesin   600 mg Oral BID   hydrALAZINE   10 mg Oral BID   insulin  aspart  0-20 Units Subcutaneous Q4H   ipratropium-albuterol   3 mL Nebulization BID   levothyroxine   50 mcg Oral Q0600   methylPREDNISolone  (SOLU-MEDROL ) injection  40 mg Intravenous Q12H   pantoprazole  (PROTONIX ) IV  40 mg Intravenous QHS    rosuvastatin   5 mg Oral QHS   Continuous Infusions:  heparin  1,450 Units/hr (11/12/23 0834)   levofloxacin  (LEVAQUIN ) IV 750 mg (11/11/23 2224)   potassium PHOSPHATE  IVPB (in mmol) 30 mmol (11/12/23 0843)   PRN Meds: acetaminophen  **OR** acetaminophen , albuterol , magnesium  hydroxide, nitroGLYCERIN , ondansetron  **OR** ondansetron  (ZOFRAN ) IV, traZODone   Allergies:    Allergies  Allergen Reactions   Statins Other (See Comments)    Aggression  Pt ok to take crestor  20mg  once a week   Hydrocortisone  Nausea Only   Ranexa [Ranolazine] Other (See Comments)    Severe weakness/near syncope after 1 dose Hallucinations    Doxycycline  Diarrhea   Hydrocodone  Nausea And Vomiting   Miralax  [Polyethylene Glycol] Nausea And Vomiting   Z-Pak [Azithromycin ] Other (See Comments)    Raises blood sugar   Zaroxolyn  [Metolazone ] Other (See Comments)    Drained, no energy    Social History:   Social History   Socioeconomic History   Marital status: Married    Spouse name: Not on file   Number of children: 3   Years of education: Not on file   Highest education level: Not on file  Occupational History   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   Occupation: MANAGER    Employer: [employer]  Tobacco Use   Smoking status: Never   Smokeless tobacco: Never  Vaping Use   Vaping status: Never Used  Substance and Sexual Activity   Alcohol use: No   Drug use: No   Sexual activity: Not on file  Other Topics Concern   Not on file  Social History Narrative   Not on file   Social Drivers of Health   Financial Resource Strain: Low Risk  (02/08/2020)   Received from [facility]   Overall Financial Resource Strain (CARDIA)    Difficulty of Paying Living Expenses: Not hard at all  Food Insecurity: No Food Insecurity (11/12/2023)   Hunger Vital Sign    Worried About Running Out of Food in the Last Year: Never true    Ran Out of Food in the Last Year: Never true  Transportation  Needs: No Transportation Needs (11/12/2023)   PRAPARE - Administrator, Civil Service (Medical): No    Lack of Transportation (Non-Medical): No  Physical Activity: Not on file  Stress: No Stress Concern Present (10/15/2023)   Received from [facility] - Occupational Stress Questionnaire    Do you feel stress - tense, restless, nervous, or anxious, or unable to sleep at night because your mind is troubled all the time - these days?: Not at all  Social Connections: Moderately Integrated (11/12/2023)   Social Connection and Isolation Panel    Frequency of Communication with Friends and Family: Once a week    Frequency of Social Gatherings with Friends and Family: Once a week    Attends Religious Services: 1 to 4 times per year    Active Member of [employer] or Organizations: Yes    Attends Engineer, structural: More than 4 times per year    Marital Status: Married  Catering manager Violence: Not At Risk (11/12/2023)   Humiliation, Afraid, Rape, and Kick questionnaire    Fear of Current or Ex-Partner: No    Emotionally Abused: No    Physically Abused: No    Sexually Abused: No    Family History:    Family History  Problem Relation Age of Onset   Kidney disease Mother    Diabetes Mother    Kidney cancer Mother    Heart attack Father    Asthma Sister    Anesthesia problems Sister        Kidney's did not wake up   Sarcoidosis Sister    Sarcoidosis Niece    Colon cancer Neg Hx    Colon  polyps Neg Hx    Rectal cancer Neg Hx    Stomach cancer Neg Hx      ROS:  Please see the history of present illness.   All other ROS reviewed and negative.     Physical Exam/Data: Vitals:   11/11/23 2325 11/12/23 0318 11/12/23 0407 11/12/23 0751  BP:   134/80   Pulse:   93 82  Resp:   16 16  Temp:   99.8 F (37.7 C)   TempSrc:   Oral   SpO2: 98% 98% 99%     Intake/Output Summary (Last 24 hours) at 11/12/2023 0928 Last data filed  at 11/12/2023 9370 Gross per 24 hour  Intake 563.2 ml  Output 550 ml  Net 13.2 ml      11/10/2023    7:37 PM 11/09/2023    4:48 PM 10/09/2023   11:22 PM  Last 3 Weights  Weight (lbs) 199 lb 15.3 oz 200 lb 200 lb 1.6 oz  Weight (kg) 90.7 kg 90.719 kg 90.765 kg     There is no height or weight on file to calculate BMI.  General:  Well nourished, well developed, in no acute distress HEENT: normal Neck: no JVD Vascular: No carotid bruits; Distal pulses 2+ bilaterally Cardiac:  normal S1, S2; RRR; no murmur  Lungs: + rhonchi or rales on 2LO2 Abd: soft, nontender, no hepatomegaly  Ext: no edema Musculoskeletal:  No deformities, BUE and BLE strength normal and equal Skin: warm and dry  Neuro:  CNs 2-12 intact, no focal abnormalities noted Psych:  Normal affect   EKG:  The EKG was personally reviewed and demonstrates:  9/6 SR, RBBB, LAFB, PVCs TELE: NSR HR 90s, PVCs  Relevant CV Studies:  Echo 07/2022 1. Left ventricular ejection fraction, by estimation, is 55 to 60%. The  left ventricle has normal function. Left ventricular endocardial border  not optimally defined to evaluate regional wall motion. Left ventricular  diastolic parameters are consistent  with Grade I diastolic dysfunction (impaired relaxation).   2. Right ventricular systolic function is normal. The right ventricular  size is normal. Tricuspid regurgitation signal is inadequate for assessing  PA pressure.   3. The mitral valve is grossly normal. Trivial mitral valve  regurgitation. No evidence of mitral stenosis.   4. The aortic valve is tricuspid. Aortic valve regurgitation is not  visualized. Aortic valve sclerosis is present, with no evidence of aortic  valve stenosis.   Limited echo 03/2022  1. Diffiicult acoustic windows, endocardium is difficult to see Lateral  wall appears mildly hypokinetic. Would consider limited echo with Definity   to help delineate. . Left ventricular ejection fraction, by estimation, is   50 to 55%. The left ventricle  has low normal function. Left ventricular diastolic parameters are  consistent with Grade I diastolic dysfunction (impaired relaxation).   2. Right ventricular systolic function is low normal. The right  ventricular size is normal.   3. Trivial mitral valve regurgitation.   4. The inferior vena cava is dilated in size with <50% respiratory  variability, suggesting right atrial pressure of 15 mmHg.   Heart monitor 01/2022   Normal sinus rhythm with avg HR 77 bpm   Rare atrial tachycardia   Rare PAC's. Frequent PVC's 8.3%.   No atrial fibrillation    Limited echo 10/2021   1. Limited echo for LV function   2. Left ventricular ejection fraction, by estimation, is 55 to 60%. The  left ventricle has normal function. The left ventricle  has no regional  wall motion abnormalities.   3. The inferior vena cava is dilated in size with >50% respiratory  variability, suggesting right atrial pressure of 8 mmHg.   Comparison(s): No significant change from prior study. 05/13/2021: LVEF  55-60%.   Echo 09/2021 1. Left ventricular ejection fraction, by estimation, is 55 to 60%. The  left ventricle has normal function. The left ventricle has no regional  wall motion abnormalities. Left ventricular diastolic parameters are  indeterminate. Elevated left ventricular  end-diastolic pressure.   2. Right ventricular systolic function is normal. The right ventricular  size is normal.   3. The mitral valve is normal in structure. No evidence of mitral valve  regurgitation. No evidence of mitral stenosis.   4. The aortic valve is tricuspid. Aortic valve regurgitation is not  visualized. Aortic valve sclerosis/calcification is present, without any  evidence of aortic stenosis.   5. The inferior vena cava is normal in size with greater than 50%  respiratory variability, suggesting right atrial pressure of 3 mmHg.   LHC 08/2021 NOvant Impression  Prox Cx to Mid Cx lesion  is 50% stenosed.   Prox LAD lesion is 50% stenosed.   Prox LAD to Mid LAD lesion is 15% stenosed.   1st Diag-1 lesion is 40% stenosed.   1st Diag-2 lesion is 20% stenosed.   RPAV lesion is 15% stenosed.   Prox RCA lesion is 20% stenosed.   Mid RCA lesion is 20% stenosed.  The imaging is on file and stored in a permanent location.   DIAGNOSTIC CARDIAC CATHETERIZATION REPORT ___________________________________________________________________________ _ Findings: -  Nonobstructive coronary artery disease, diffuse small vessel disease, no target for PCI - Left ventricular filling pressures (LVEDP = 20 mm Hg). - Left ventricular contractile function (estimated LVEF = 50).  Recommendations: -Optimize antiheart failure medication -Evaluate for anemia, optimize hemoglobin, consider repeat evaluation by GI given his history of polyps and GI bleeding  Heart cath 01/2021   Prox LAD-1 lesion is 40% stenosed.   Prox LAD-2 lesion is 65% stenosed.   Mid Cx lesion is 50% stenosed.   Mid RCA lesion is 60% stenosed.   Prox RCA lesion is 75% stenosed.   Ost Ramus to Ramus lesion is 70% stenosed.   Ost 1st Diag lesion is 80% stenosed.   Non-stenotic Ost RPDA lesion was previously treated.   Non-stenotic Ost RPDA to RPDA lesion was previously treated.   Non-stenotic RPAV lesion was previously treated.   A drug-eluting stent was successfully placed using a STENT ONYX FRONTIER 3.5X15.   A drug-eluting stent was successfully placed using a STENT ONYX FRONTIER 3.5X18.   A drug-eluting stent was successfully placed using a STENT ONYX FRONTIER 2.5X18.   Post intervention, there is a 0% residual stenosis.   Post intervention, there is a 0% residual stenosis.   Post intervention, there is a 0% residual stenosis.   Successful two-vessel PCI guided by RFR/FFR with stenting of severe stenoses in the proximal and mid RCA and severe stenosis in the first diagonal branch of the LAD.  All lesions treated with  Onyx frontier drug-eluting stents (total of 3 stents implanted).  Patient should be treated with dual antiplatelet therapy with aspirin  and clopidogrel  for minimum of 6 months, favor long-term therapy if well-tolerated considering his multivessel stenting.  Same-day PCI protocol.    Heart cath 12/2020 CONCLUSIONS: Diffuse three-vessel coronary disease is noted on diagram with moderate LAD beyond the stented segment, moderately severe diagonal, moderate less than or equal  to 70% ramus, new 75 to 80% proximal and 60% mid to distal RCA.  Left main is unremarkable. Physiology assessment across the tandem right coronary lesions did not demonstrate ischemia (RFR 0.91). Low normal to mildly depressed LV function, EF 45 to 50%.  EDP 17 mmHg. Mild pulmonary hypertension with mean pressure 71 mmHg.  Wedge pressure 10 mmHg.   RECOMMENDATIONS:   Given chronic kidney disease, multiple lesions, and no specific target that is clearly a culprit, my approach was more conservative. Recommend consideration of SGLT2 therapy for component of chronic midrange combined systolic and diastolic heart failure. RCA could be treated although no physiologic support noted today. Diagonal is largely unchanged.     Echo 07/2018 1. The left ventricle has normal systolic function with an ejection  fraction of 60-65%. The cavity size was normal. There is mildly increased  left ventricular wall thickness. Left ventricular diastolic Doppler  parameters are consistent with impaired  relaxation.   2. The right ventricle has normal systolic function. The cavity was  normal. There is no increase in right ventricular wall thickness.   3. The aortic valve is tricuspid. Mild thickening of the aortic valve.  Mild calcification of the aortic valve.   Laboratory Data: High Sensitivity Troponin:   Recent Labs  Lab 11/11/23 2016 11/11/23 2244 11/12/23 0434  TROPONINIHS 1,170* 992* 753*     Chemistry Recent Labs  Lab  11/11/23 0749 11/11/23 2016 11/12/23 0434  NA 139 139 139  K 3.8 3.7 3.8  CL 100 97* 96*  CO2 32 33* 32  GLUCOSE 270* 261* 237*  BUN 20 18 16   CREATININE 1.28* 1.38* 1.33*  CALCIUM  8.6* 8.4* 8.4*  MG  --  1.7  --   GFRNONAA 58* 53* 55*  ANIONGAP 7 9 11     Recent Labs  Lab 11/10/23 1934 11/11/23 2016 11/12/23 0434  PROT 6.7 6.8 7.0  ALBUMIN 3.4* 3.0* 3.0*  AST 28 29 28   ALT 20 18 20   ALKPHOS 56 66 69  BILITOT 1.2 1.2 1.4*   Lipids No results for input(s): CHOL, TRIG, HDL, LABVLDL, LDLCALC, CHOLHDL in the last 168 hours.  Hematology Recent Labs  Lab 11/10/23 1934 11/11/23 0749 11/12/23 0434  WBC 17.4* 14.3* 14.3*  RBC 3.93* 3.98* 4.00*  HGB 12.8* 12.6* 12.9*  HCT 40.1 40.7 41.0  MCV 102.0* 102.3* 102.5*  MCH 32.6 31.7 32.3  MCHC 31.9 31.0 31.5  RDW 13.7 13.6 13.2  PLT 268 267 269   Thyroid   Recent Labs  Lab 11/11/23 0749  TSH 3.163    BNP Recent Labs  Lab 11/11/23 2016  BNP 838.2*    DDimer  Recent Labs  Lab 11/11/23 2016  DDIMER 1.48*    Radiology/Studies:  DG Chest Port 1 View Result Date: 11/12/2023 CLINICAL DATA:  [id number].  Follow-up exam. EXAM: PORTABLE CHEST 1 VIEW COMPARISON:  Portable chest yesterday at 7:06 a.m. FINDINGS: 6:03 a.m. There is patchy airspace disease in both lung bases. Overall aeration is unchanged with remaining lungs clear. Minimal pleural effusions. Stable cardiomegaly. No vascular congestion is seen. Stable mediastinum and osseous structures. IMPRESSION: 1. No significant change in patchy airspace disease in both lung bases. 2. Stable cardiomegaly. 3. Minimal pleural effusions. Electronically Signed   By: Francis Quam M.D.   On: 11/12/2023 07:56   CT Angio Chest Pulmonary Embolism (PE) W or WO Contrast Result Date: 11/12/2023 EXAM: CTA of the Chest with contrast for PE 11/12/2023 05:42:36 AM TECHNIQUE: CTA of the chest was  performed after the administration of intravenous contrast. Multiplanar reformatted images  are provided for review. MIP images are provided for review. Automated exposure control, iterative reconstruction, and/or weight based adjustment of the mA/kV was utilized to reduce the radiation dose to as low as reasonably achievable. COMPARISON: CT of the chest dated 08/02/2022. CLINICAL HISTORY: Pulmonary embolism (PE) suspected, low to intermediate prob, positive D-dimer. FINDINGS: PULMONARY ARTERIES: Pulmonary arteries are adequately opacified for evaluation. No pulmonary embolism. Main pulmonary artery is normal in caliber. MEDIASTINUM: The heart and pericardium demonstrate no acute abnormality. There is moderate calcific coronary artery disease. There is mild-to-moderate calcific atheromatous disease within the aortic arch. LYMPH NODES: No mediastinal, hilar or axillary lymphadenopathy. LUNGS AND PLEURA: There are dependent ground-glass opacities within the lungs bilaterally. There are streaky peribronchovascular opacities within the lower lobes bilaterally. There is a mild right pleural effusion and a tiny left effusion. UPPER ABDOMEN: The patient is status post cholecystectomy. SOFT TISSUES AND BONES: No acute bone or soft tissue abnormality. IMPRESSION: 1. No evidence of pulmonary embolism. 2. Mild right pleural effusion and tiny left effusion. 3. Peribronchial opacities, compatible with bronchitis/bronchiolitis. Electronically signed by: Evalene Coho MD 11/12/2023 05:52 AM EDT RP Workstation: [id number]   CT HEAD WO CONTRAST ( ) Result Date: 11/11/2023 CLINICAL DATA:  Encephalopathy. EXAM: CT HEAD WITHOUT CONTRAST TECHNIQUE: Contiguous axial images were obtained from the base of the skull through the vertex without intravenous contrast. RADIATION DOSE REDUCTION: This exam was performed according to the departmental dose-optimization program which includes automated exposure control, adjustment of the mA and/or kV according to patient size and/or use of iterative reconstruction technique.  COMPARISON:  None Available. FINDINGS: Brain: There is no evidence of an acute infarct, intracranial hemorrhage, mass, midline shift, or extra-axial fluid collection. There is mild cerebral atrophy. Cerebral white matter hypodensities are nonspecific but compatible with mild chronic small vessel ischemic disease. Vascular: Calcified atherosclerosis at the skull base. No hyperdense vessel. Skull: No acute fracture or suspicious lesion. Sinuses/Orbits: Small volume secretions in the right sphenoid sinus. Small bilateral mastoid effusions. Bilateral cataract extraction. Other: None. IMPRESSION: 1. No evidence of acute intracranial abnormality. 2. Mild chronic small vessel ischemic disease. Electronically Signed   By: Dasie Hamburg M.D.   On: 11/11/2023 19:05   DG Chest 1 View Result Date: 11/11/2023 CLINICAL DATA:  Congestive heart failure. EXAM: CHEST  1 VIEW COMPARISON:  November 10, 2023. FINDINGS: Stable cardiomegaly. Mild right basilar subsegmental atelectasis or infiltrate is noted. Left lung is clear. Bony thorax is unremarkable. IMPRESSION: Mild right basilar subsegmental atelectasis or infiltrate. Electronically Signed   By: Lynwood Landy Raddle M.D.   On: 11/11/2023 09:10   DG Chest 1 View Result Date: 11/10/2023 CLINICAL DATA:  Pneumonia, vomiting, shortness of breath EXAM: CHEST  1 VIEW COMPARISON:  11/09/2023 FINDINGS: Cardiomegaly. Bilateral lower lobe airspace opacities, right greater than left concerning for pneumonia. No visible effusions. No acute bony abnormality. IMPRESSION: Bilateral lower lobe airspace opacities, right greater than left concerning for pneumonia. This is progressed since prior study. Electronically Signed   By: Franky Crease M.D.   On: 11/10/2023 18:12   DG Chest Portable 1 View Result Date: 11/09/2023 CLINICAL DATA:  Overdose. EXAM: PORTABLE CHEST 1 VIEW COMPARISON:  10/09/2023 FINDINGS: Stable cardiomegaly. Subsegmental atelectasis or scarring. No pulmonary edema, large pleural  effusion or pneumothorax. No acute osseous findings. IMPRESSION: Stable cardiomegaly. Subsegmental atelectasis or scarring. Electronically Signed   By: Andrea Gasman M.D.   On: 11/09/2023 22:54  Assessment and Plan:  Elevated troponin CAD with multiple prior PCIs - admitted involuntarily for suicidal ideation. On 9/6 he developed hypoxia and rapid response was called. Suspicion for PNA. HS trop was elevated and he was started on IV heparin  - no chest pain reported - HS trop trending down - echo ordered - continue IV heparin  x 48 hours - plavix  75mg  daily, zetia  10mg  daily, crestor  5mg  daily - last cath in 2023 showed nonobstructive CAD - recent echo at [facility] showed LVEF 45-50% and he was having occasional chest pain - repeat echo pending, but would likely benefit from ischemic evaluation. He is not the best cath candidate given current psych issues and CKD  PNA Pulmonary sarcoidosis - abx per IM  HFmrEF - recent echo at [facility] 10/2023 showed LVEF 45-50%. It was in the setting of hospitalization for PAD and cellulitis - plan was for stress test if he developed chest pain - appears euvolemic - PTA losartan  held - echo pending  HTN - BP stable - amlodipine  5mg  daily - hydral 10mg  BID  HLD - statin therapy  PVCs - prior heart monitor with 8/3% burden -PTA not on BB  Suicidal ideation - per psych    For questions or updates, please contact [facility] Please consult [email or web address] for contact info under    Signed, Jian Hodgman VEAR Fishman, PA-C  11/12/2023 9:28 AM

## 2023-11-12 NOTE — Progress Notes (Signed)
 Responded to overhead page for rapid response. Erminio Cone NP at bedside. Patient with tremor and weakness. Family at bedside. Orders place by MD and Erminio Cone. IV team responded to place IVs so that fluids and IV antibiotics could be initiated.

## 2023-11-12 NOTE — Consult Note (Signed)
 [facility] Psychiatric Consult Initial  Patient Name: .[name]  MRN: [id number]  DOB: 1945-11-29  Consult Order details:  Orders (From admission, onward)     Start     Ordered   11/12/23 0928  IP CONSULT TO PSYCHIATRY       Ordering Provider: Jerelene Critchley, MD  Provider:  (Not yet assigned)  Question Answer Comment  Location [facility]   Reason for Consult? Patient here with Suicidal attempt, Kindly follow, Thank you      11/12/23 0928             Mode of Visit: In person    Psychiatry Consult Evaluation  Service Date: November 12, 2023 LOS:  LOS: 1 day  Chief Complaint Suspected unintentional drug overdose  Primary Psychiatric Diagnoses  Unintentional drug overdose Adjustment disorder    Assessment  [name] is a 78 y.o. male admitted: Medically [name] is an 78 y.o. male with no past psych history and past medical history of  HFrEF  -ejection fraction around 45 to 50% ; CKD; DM:HLD, PVD, left midfoot ulcer and necrosis with recent surgery, pulmonary sarcoid, OSA who presents to the ED on 11/09/23 with complaint of suicide attempt.  Patient was IVC and sent to our behavioral health unit for further care.  Reportedly patient overdosed on tramadol on 11/09/2023.   Patient was medically cleared by the ED team and was sent to our behavioral health unit.  While at the behavioral unit on 11/10/23 patient was hypoxic and rapid response was called.  Patient also had one episode of vomiting.  Patient at that time was placed on 2 L of nasal oxygen  which increased patient oxygenation to 93-94 which was earlier in 80s reportedly.  Patient also received a DuoNeb treatment and was given Lasix  yesterday and was started on antibiotic therapy by hospitalist team.  Chest XR was also done and suspected pneumonia.   Nurse reports that patient has been denying any suicidal thoughts to them and denying feeling depressed.    11/11/23: Patient on interview today was  quite sedated minimally engaging in the interview.  Patient kept dozing off during the conversation.  But able to tell his name.  Patient endorses that he was feeling depressed and had suicidal thoughts initially.  Then this Clinical research associate again asked the patient said that he was not depressed and not suicidal.  Patient appears confused unable to engage in any meaningful conversation today. Attending physician discussed case with the hospitalist team and given acutely sedated, confused and had an episode of hypoxia she also agreed to do a CT scan and do an ABG. Reviewed patient chart patient has never been seen for any psychiatric issues in the past.  11/12/23: Patient denies that this incident was a suicide attempt, states he took wife's medication by mistake thinking that it was his pain medication. Patient denies SI/HI/plan and denies hallucinations. Patient has no prior psychiatric history.  Wife at bedside, she states she does not believe this was a suicide attempt.   We suspect this was most likely an unintentional drug overdose. We will continue to follow up at this time.   Diagnoses:  Active Hospital problems: Principal Problem:   Aspiration pneumonia (HCC) Active Problems:   Sarcoidosis   Coronary artery disease with exertional angina (HCC)   GERD   OSA (obstructive sleep apnea)   History of adrenal insufficiency   Pulmonary sarcoidosis (HCC)   Uncontrolled type 2 diabetes mellitus with hyperglycemia,  with long-term current use of insulin  (HCC)   Hyperlipidemia   Stage 3a chronic kidney disease (CKD) (HCC)   Suicidal behavior with attempted self-injury (HCC)   Non-ST elevation (NSTEMI) myocardial infarction (HCC)   HFrEF (heart failure with reduced ejection fraction) (HCC)   Pressure injury of skin    Plan   ## Psychiatric Medication Recommendations:  None at this time  ## Medical Decision Making Capacity: Not specifically addressed in this encounter  ## Further Work-up:   -- most  recent EKG on 11/11/23 had QtC of 507 -- Pertinent labwork reviewed earlier this admission includes: blood gas, CMP, BNP, CK, troponin, CBC, heparin  studies, cortisol, HIV screen, UA, urine toxicology screen   ## Disposition:-- We will continue to follow up at this time.  ## Behavioral / Environmental: -Utilize compassion and acknowledge the patient's experiences while setting clear and realistic expectations for care.    ## Safety and Observation Level:  - Based on my clinical evaluation, I estimate the patient to be at low risk of self harm in the current setting. - At this time, we recommend  1:1 Observation. This decision is based on my review of the chart including patient's history and current presentation, interview of the patient, mental status examination, and consideration of suicide risk including evaluating suicidal ideation, plan, intent, suicidal or self-harm behaviors, risk factors, and protective factors. This judgment is based on our ability to directly address suicide risk, implement suicide prevention strategies, and develop a safety plan while the patient is in the clinical setting. Please contact our team if there is a concern that risk level has changed.   Suicide Risk Assessment: Patient has following modifiable risk factors for suicide: pain, medical illness (ie new dx of cancer), which we are addressing by continuing to follow up. Patient has following non-modifiable or demographic risk factors for suicide: male gender Patient has the following protective factors against suicide: Supportive family  Thank you for this consult request. Recommendations have been communicated to the primary team.  We will continue to follow up at this time.   Tichina Koebel LITTIE Lukes, PA-C       History of Present Illness  Relevant Aspects of [facility]   Patient Report:  Patient denies suicide attempt when he took the pills, states he thought he was taking his pain medication but  accidentally took his wife's medication instead. Patient denies SI/HI/plan and denies hallucinations.  Discussed with patient's wife at bedside, she does not believe this was a suicide attempt and does not have any psychiatric concerns at this time. She reports patient was doing very well 30-40 minutes prior to this incident. She reports their son came to their home to check on the patient while she was out after patient did not answer two phone calls placed by son, and that is how patient was found after taking the medication. Patient's wife states it is not unusual for their son to stop by their home to check in.  Patient and family do not want psychotropic medication.   Psych ROS:  Depression: situational  Anxiety:  denies  Mania (lifetime and current): denies  Psychosis: (lifetime and current): denies   Collateral information:  Discussed with patient's wife at bedside. She does not believe patient attempted suicide and denies psychiatric concerns at this time.     Psychiatric and Social History  Psychiatric History:  Information collected from the patient, patient's wife, and chart review.   Prev Dx/Sx: none Current Psych Provider: unknown Home Meds (  current): none Previous Med Trials: unknown  Therapy: unknown   Prior Psych Hospitalization: none  Prior Self Harm: none  Family Psych History: unknown  Family Hx suicide: unknown   Social History:   Educational Hx: unknown  Occupational Hx: unknown  Legal Hx: unknown  Living Situation: lives with wife  Spiritual Hx: unknown  Access to weapons/lethal means: unknown       Exam Findings  Physical Exam: Reviewed and agree with the physical exam findings conducted by the medical provider.  Vital Signs:  Temp:  [97.3 F (36.3 C)-99.8 F (37.7 C)] 99.8 F (37.7 C) (09/07 0407) Pulse Rate:  [82-99] 82 (09/07 0751) Resp:  [16] 16 (09/07 0751) BP: (134-158)/(80-100) 134/80 (09/07 0407) SpO2:  [97 %-100 %] 99 % (09/07  0407) Blood pressure 134/80, pulse 82, temperature 99.8 F (37.7 C), temperature source Oral, resp. rate 16, SpO2 99%. There is no height or weight on file to calculate BMI.    Mental Status Exam: General Appearance: Casual  Orientation:  Full (Time, Place, and Person)  Memory:  Immediate;   Fair Recent;   Fair Remote;   Fair  Concentration:  Concentration: Fair and Attention Span: Fair  Recall:  Fair  Attention  Fair  Eye Contact:  Fair  Speech:  Slow  Language:  Good  Volume:  Normal  Mood: appropriate   Affect:  Appropriate  Thought Process:  Coherent  Thought Content:  WDL  Suicidal Thoughts:  No  Homicidal Thoughts:  No  Judgement:  Intact  Insight:  Fair   Psychomotor Activity:  Normal  Akathisia:  No  Fund of Knowledge:  Good      Assets:  Architect Housing Social Support  Cognition:  WNL  ADL's:  Intact  AIMS (if indicated):        Other History   These have been pulled in through the EMR, reviewed, and updated if appropriate.  Family History:  The patient's family history includes Anesthesia problems in his sister; Asthma in his sister; Diabetes in his mother; Heart attack in his father; Kidney cancer in his mother; Kidney disease in his mother; Sarcoidosis in his niece and sister.  Medical History: Past Medical History:  Diagnosis Date   Adrenal insufficiency (HCC)    Allergy    Anemia    Arthritis    feet    Broken foot 09/2019   had to wore a boot. Left    CKD (chronic kidney disease), stage III (HCC)    Coronary artery disease    has stents   Coronary atherosclerosis of native coronary artery    Proximal LAD, posterior lateral stent widely patent-10/12/11   Diabetes mellitus    insulin  and pills   Hearing loss    wears hearing aids   History of blood transfusion 06/30/2016   [name] - 2 units transfused   Hyperlipidemia    Hypertension    Hypoxia 11/10/2023   OSA (obstructive sleep apnea)     uses VPAC sleep study 2 years done through [place]. Dr. Jeffrie arranged study   PVD (peripheral vascular disease) (HCC)    Sarcoidosis    Sleep apnea    uses CPAP nightly   Thyroid  disease    Type 2 diabetes mellitus [facility])     Surgical History: Past Surgical History:  Procedure Laterality Date   APPENDECTOMY     CATARACT EXTRACTION  2011   bilat   CHOLECYSTECTOMY  01/25/2011   Procedure: LAPAROSCOPIC CHOLECYSTECTOMY WITH INTRAOPERATIVE  CHOLANGIOGRAM;  Surgeon: Redell Alm Faith, DO;  Location: [facility] OR;  Service: General;  Laterality: N/A;   COLONOSCOPY     several   COLONOSCOPY WITH PROPOFOL  N/A 01/03/2020   Procedure: COLONOSCOPY WITH PROPOFOL ;  Surgeon: Avram Lupita BRAVO, MD;  Location: [facility] ENDOSCOPY;  Service: Endoscopy;  Laterality: N/A;   CORONARY ANGIOPLASTY     most recent 11/2009   CORONARY PRESSURE/FFR STUDY N/A 01/18/2021   Procedure: INTRAVASCULAR PRESSURE WIRE/FFR STUDY;  Surgeon: Wonda Sharper, MD;  Location: [facility] INVASIVE CV LAB;  Service: Cardiovascular;  Laterality: N/A;   CORONARY STENT INTERVENTION N/A 08/07/2018   Procedure: CORONARY STENT INTERVENTION;  Surgeon: Dann Candyce RAMAN, MD;  Location: [facility] INVASIVE CV LAB;  Service: Cardiovascular;  Laterality: N/A;   CORONARY STENT INTERVENTION N/A 01/18/2021   Procedure: CORONARY STENT INTERVENTION;  Surgeon: Wonda Sharper, MD;  Location: [facility] INVASIVE CV LAB;  Service: Cardiovascular;  Laterality: N/A;   CORONARY STENT PLACEMENT  2009   in LAD and side branch PTCA   ESOPHAGOGASTRODUODENOSCOPY (EGD) WITH PROPOFOL  N/A 01/03/2020   Procedure: ESOPHAGOGASTRODUODENOSCOPY (EGD) WITH PROPOFOL ;  Surgeon: Avram Lupita BRAVO, MD;  Location: [facility];  Service: Endoscopy;  Laterality: N/A;   HEMOSTASIS CLIP PLACEMENT  01/03/2020   Procedure: HEMOSTASIS CLIP PLACEMENT;  Surgeon: Avram Lupita BRAVO, MD;  Location: [facility]OSCOPY;  Service: Endoscopy;;   INTERCOSTAL NERVE BLOCK  2011, 06/2016   x2. lumbar spine   LEFT HEART CATHETERIZATION  WITH CORONARY ANGIOGRAM Bilateral 10/12/2011   Procedure: LEFT HEART CATHETERIZATION WITH CORONARY ANGIOGRAM;  Surgeon: Oneil Parchment, MD;  Location: [facility] CATH LAB;  Service: Cardiovascular;  Laterality: Bilateral;   LEFT HEART CATHETERIZATION WITH CORONARY ANGIOGRAM N/A 04/01/2014   Procedure: LEFT HEART CATHETERIZATION WITH CORONARY ANGIOGRAM;  Surgeon: Oneil Parchment, MD;  Location: [facility] CATH LAB;  Service: Cardiovascular;  Laterality: N/A;   POLYPECTOMY  01/03/2020   Procedure: POLYPECTOMY;  Surgeon: Avram Lupita BRAVO, MD;  Location: [facility]OSCOPY;  Service: Endoscopy;;   RIGHT/LEFT HEART CATH AND CORONARY ANGIOGRAPHY N/A 08/07/2018   Procedure: RIGHT/LEFT HEART CATH AND CORONARY ANGIOGRAPHY;  Surgeon: Dann Candyce RAMAN, MD;  Location: [facility] INVASIVE CV LAB;  Service: Cardiovascular;  Laterality: N/A;   RIGHT/LEFT HEART CATH AND CORONARY ANGIOGRAPHY N/A 12/25/2020   Procedure: RIGHT/LEFT HEART CATH AND CORONARY ANGIOGRAPHY;  Surgeon: Claudene Victory ORN, MD;  Location: [facility] INVASIVE CV LAB;  Service: Cardiovascular;  Laterality: N/A;   TRANSFORAMINAL LUMBAR INTERBODY FUSION W/ MIS 1 LEVEL N/A 09/24/2021   Procedure: LUMBAR FOUR-FIVE MINIMALLY INVASIVE (MIS) TRANSFORAMINAL LUMBAR INTERBODY FUSION (TLIF) WITH METRX;  Surgeon: Cheryle Debby LABOR, MD;  Location: [facility] OR;  Service: Neurosurgery;  Laterality: N/A;     Medications:   Current Facility-Administered Medications:    acetaminophen  (TYLENOL ) tablet 650 mg, 650 mg, Oral, Q6H PRN **OR** acetaminophen  (TYLENOL ) suppository 650 mg, 650 mg, Rectal, Q6H PRN, Mansy, Jan A, MD   albuterol  (PROVENTIL ) (2.5 MG/3ML) 0.083% nebulizer solution 2.5 mg, 2.5 mg, Nebulization, Q6H PRN, Ponnala, Shruthi, MD   aspirin  EC tablet 81 mg, 81 mg, Oral, Daily, Raford Riggs, MD, 81 mg at 11/12/23 1439   clopidogrel  (PLAVIX ) tablet 75 mg, 75 mg, Oral, Daily, Jesus America, NP, 75 mg at 11/12/23 9166   ezetimibe  (ZETIA ) tablet 10 mg, 10 mg, Oral, Daily, Jesus America, NP, 10 mg  at 11/12/23 9166   guaiFENesin  (MUCINEX ) 12 hr tablet 600 mg, 600 mg, Oral, BID, Mansy, Jan A, MD, 600 mg at 11/12/23 9166   heparin  ADULT infusion 100 units/mL (25000 units/250mL), 1,450 Units/hr,  Intravenous, Continuous, Suzann Allean LABOR, RPH, Last Rate: 14.5 mL/hr at 11/12/23 1710, 1,450 Units/hr at 11/12/23 1710   hydrALAZINE  (APRESOLINE ) tablet 10 mg, 10 mg, Oral, BID, Jesus America, NP, 10 mg at 11/12/23 9166   insulin  aspart (novoLOG ) injection 0-20 Units, 0-20 Units, Subcutaneous, Q4H, Jesus America, NP, 7 Units at 11/12/23 1641   ipratropium-albuterol  (DUONEB) 0.5-2.5 (3) MG/3ML nebulizer solution 3 mL, 3 mL, Nebulization, BID, Ponnala, Shruthi, MD   isosorbide  mononitrate (IMDUR ) 24 hr tablet 30 mg, 30 mg, Oral, Daily, Raford Riggs, MD, 30 mg at 11/12/23 1439   levofloxacin  (LEVAQUIN ) IVPB 750 mg, 750 mg, Intravenous, Q24H, Garba, Mohammad L, MD, Stopped at 11/11/23 2354   levothyroxine  (SYNTHROID ) tablet 50 mcg, 50 mcg, Oral, Q0600, Jesus America, NP, 50 mcg at 11/12/23 0551   magnesium  hydroxide (MILK OF MAGNESIA) suspension 30 mL, 30 mL, Oral, Daily PRN, Mansy, Jan A, MD   methylPREDNISolone  sodium succinate  (SOLU-MEDROL ) 40 mg/mL injection 40 mg, 40 mg, Intravenous, Q12H, Jesus America, NP, 40 mg at 11/12/23 0833   [START ON 11/13/2023] metoprolol  succinate (TOPROL -XL) 24 hr tablet 25 mg, 25 mg, Oral, Daily, Raford Riggs, MD   montelukast  (SINGULAIR ) tablet 10 mg, 10 mg, Oral, Daily PRN, Ponnala, Shruthi, MD   nitroGLYCERIN  (NITROSTAT ) SL tablet 0.4 mg, 0.4 mg, Sublingual, Q5 min PRN, Jesus America, NP   ondansetron  (ZOFRAN ) tablet 4 mg, 4 mg, Oral, Q6H PRN **OR** ondansetron  (ZOFRAN ) injection 4 mg, 4 mg, Intravenous, Q6H PRN, Mansy, Jan A, MD   pantoprazole  (PROTONIX ) injection 40 mg, 40 mg, Intravenous, QHS, Jesus America, NP, 40 mg at 11/11/23 2240   predniSONE  (DELTASONE ) tablet 10 mg, 10 mg, Oral, QHS, Ponnala, Shruthi, MD   rosuvastatin  (CRESTOR )  tablet 5 mg, 5 mg, Oral, QHS, Jesus America, NP   NOREEN ON 11/13/2023] sacubitril -valsartan  (ENTRESTO ) 24-26 mg per tablet, 1 tablet, Oral, BID, Raford Riggs, MD   traZODone  (DESYREL ) tablet 25 mg, 25 mg, Oral, QHS PRN, Mansy, Madison LABOR, MD  Allergies: Allergies  Allergen Reactions   Statins Other (See Comments)    Aggression  Pt ok to take crestor  20mg  once a week   Hydrocortisone  Nausea Only   Ranexa [Ranolazine] Other (See Comments)    Severe weakness/near syncope after 1 dose Hallucinations    Doxycycline  Diarrhea   Hydrocodone  Nausea And Vomiting   Miralax  [Polyethylene Glycol] Nausea And Vomiting   Z-Pak [Azithromycin ] Other (See Comments)    Raises blood sugar   Zaroxolyn  [Metolazone ] Other (See Comments)    Drained, no energy    Maricsa Sammons LITTIE Lukes, PA-C

## 2023-11-12 NOTE — Progress Notes (Addendum)
 PROGRESS NOTE    [name]  FMW:[id number] DOB: 04/23/1945 DOA: 11/11/2023 PCP: Arloa Elsie SAUNDERS, MD  No chief complaint on file.   Hospital Course:  [name] is a 78 y.o. male with medical history significant of adrenal insufficiency, chronic kidney disease stage III, heart failure with reduced ejection fraction EF of 45 to 50%, coronary artery disease with lesion to LAD, PAD complex stenting done recently 4 weeks ago, history of beta-blockers insulin -dependent diabetes, chronic hearing loss, hyperlipidemia, hypothyroidism, iron  deficiency anemia, lumbar degenerative disc disease with radiculopathy, chronic eczema, history of Charcot foot of the left, history of diabetic neuropathy obstructive sleep apnea and sarcoidosis, essential hypertension who was admitted  to inpatient psychiatric unit with suicide ideation.  Patient was involuntarily committed after suicide attempt for which he took 10 to 60 tablets of tramadol.  Patient was being evaluated and apparently has denied suicidal thoughts.    However patient became hypoxic and Rapid response was called 09/05  when he became hypoxic.  He had 1 episode of vomiting.  Was placed on 2 L of oxygen .  Also started on DuoNeb and Lasix .  Patient was started on empiric antibiotics therapy for possible aspiration pneumonia.  Workup was done including chest x-ray that suspected the pneumonia.  Due to concern for patient's multitude of medical problems he was transferred to acute medical care for management of the suspected aspiration pneumonia. Hospital course as below  Subjective: Patient was examined at the bedside, spouse Reena present. Mental status is significantly improved, states he feels better.  Complain of being tired, otherwise no other symptoms.  Discussed plan of care with patient and spouse at the bedside  Had rapid response yesterday due to twitching, workup revealed elevated troponin, seen by cardiology and on heparin   drip   Objective: Vitals:   11/11/23 2325 11/12/23 0318 11/12/23 0407 11/12/23 0751  BP:   134/80   Pulse:   93 82  Resp:   16 16  Temp:   99.8 F (37.7 C)   TempSrc:   Oral   SpO2: 98% 98% 99%     Intake/Output Summary (Last 24 hours) at 11/12/2023 1727 Last data filed at 11/12/2023 1710 Gross per 24 hour  Intake 1468.19 ml  Output 550 ml  Net 918.19 ml   There were no vitals filed for this visit.  Examination: Constitutional: Chronically ill looking, weak, NAD, calm, comfortable Eyes: PERRL, lids and conjunctivae normal ENMT: Mucous membranes are moist. Posterior pharynx clear of any exudate or lesions.Normal dentition.  Neck: normal, supple, no masses, no thyromegaly Respiratory: Decreased air entry mild scattered wheezes or rhonchi bilaterally normal respiratory effort. No accessory muscle use.  Cardiovascular: Regular rate and rhythm, no murmurs / rubs / gallops. No extremity edema. 2+ pedal pulses. No carotid bruits.  Abdomen: no tenderness, no masses palpated. No hepatosplenomegaly. Bowel sounds positive.  Musculoskeletal: Good range of motion, no joint swelling or tenderness, Skin: no rashes, lesions, ulcers. No induration Neurologic: CN 2-12 grossly intact. Sensation intact, DTR normal. Strength 5/5 in all 4.  Psychiatric: Withdrawn, depressed, no agitation, awake and alert  Assessment & Plan:  Principal Problem:   Aspiration pneumonia (HCC) Active Problems:   Sarcoidosis   Coronary artery disease with exertional angina (HCC)   GERD   OSA (obstructive sleep apnea)   History of adrenal insufficiency   Pulmonary sarcoidosis (HCC)   Uncontrolled type 2 diabetes mellitus with hyperglycemia, with long-term current use of insulin  (HCC)   Hyperlipidemia   Stage  3a chronic kidney disease (CKD) (HCC)   Suicidal behavior with attempted self-injury (HCC)   Non-ST elevation (NSTEMI) myocardial infarction (HCC)   HFrEF (heart failure with reduced ejection fraction) (HCC)    Pressure injury of skin  Suicidal attempt Acute toxic encephalopathy due to tramadol overdose - s/p Narcan  in ER after suicide attempt overdose on 10-60 tabs of tramadol - VBG with pCO2 of 63, likely due to tramadol - Mental status improving - On IVC, 1:1 sitter - Psychiatry following, appreciate recs   Acute hypoxic respiratory failure - Suspect due to tramadol overdose and aspiration pneumonia - Wean O2 as able  Aspiration pneumonia - Suspect aspiration acute as patient was found hypoxic after having episode of emesis.  - Leukocytosis improving, low-grade fever - COVID/flu/RSV negative - CXR with bilateral lower lobe airspace opacities, R > left - Change po Augmentin  to IV Levaquin . - Will obtain blood cultures if patient continues to spike fever - Duonebs prn  NSTEMI CAD - Troponin elevated, now downtrending, reports having angina at times with exertion - CTA negative for PE - Echo shows EF 35 to 45% with wall motion abnormalities - On IV heparin  gtt. for 48 hours - Add aspirin  to Plavix , DAPT for 1 year, Zetia , statin - Add Imdur  30 mg daily, metoprolol  succinate 25 mg daily, recommend adding SGLT2 prior to discharge - Seen by cardiology, appreciate recs, patient and spouse would not like to have LHC at this time   Chronic HFmrEF - Appears to be euvolemic to mild fluid overload - Echo LVEF 45-50% with a septal hypokinesis of the mid to apical anterior/lateral segment - On Lasix  prn  CKD stage3 - Cr at baseline  Hypophosphatemia - Monitor and replete as needed   HTN - Continue hydralazine , Imdur , Entresto , metoprolol  - Hold amlodipine , start Entresto  instead of losartan    HLD - Zetia , statin   Diabetes mellitus 2 - Hb A1c pending - Lantus  5u, SSI  - Monitor blood glucose and titrate as needed   S/p left foot wound debridement Recent left foot cellulitis - Completed antibiotics - Standing in the boot is allowed, but walking is not recommended  -  Follow-up with podiatry outpatient   Normocytic anemia -Monitor Hb intermittently   History of sarcoidosis - Continue prednisone  10 mg daily  Hx adrenal insufficiency: Continue prednisone    Hypothyroidism Continue levothyroxine    PT/OT eval  DVT prophylaxis: Heparin  gtt   Code Status: Full Code Disposition:  Pending  Consultants:  Treatment Team:  Consulting Physician: Perla Evalene PARAS, MD Psychiatry  Procedures:  None  Antimicrobials:  Anti-infectives (From admission, onward)    Start     Dose/Rate Route Frequency Ordered Stop   11/11/23 2200  doxycycline  (VIBRA -TABS) tablet 100 mg  Status:  Discontinued        100 mg Oral Every 12 hours 11/11/23 1927 11/11/23 1959   11/11/23 2200  cefTRIAXone  (ROCEPHIN ) 2 g in sodium chloride  0.9 % 100 mL IVPB  Status:  Discontinued        2 g 200 mL/hr over 30 Minutes Intravenous Every 24 hours 11/11/23 1953 11/11/23 2007   11/11/23 2200  doxycycline  (VIBRAMYCIN ) 100 mg in sodium chloride  0.9 % 250 mL IVPB  Status:  Discontinued        100 mg 125 mL/hr over 120 Minutes Intravenous Every 12 hours 11/11/23 1957 11/11/23 2130   11/11/23 2200  levofloxacin  (LEVAQUIN ) IVPB 750 mg        750 mg 100 mL/hr over 90 Minutes  Intravenous Every 24 hours 11/11/23 2015 11/16/23 2159   11/11/23 2100  Ampicillin -Sulbactam (UNASYN ) 3 g in sodium chloride  0.9 % 100 mL IVPB  Status:  Discontinued        3 g 200 mL/hr over 30 Minutes Intravenous Every 6 hours 11/11/23 2007 11/11/23 2130   11/11/23 2015  cefTRIAXone  (ROCEPHIN ) 1 g in sodium chloride  0.9 % 100 mL IVPB  Status:  Discontinued        1 g 200 mL/hr over 30 Minutes Intravenous Every 24 hours 11/11/23 1927 11/11/23 1955       Data Reviewed: I have personally reviewed following labs and imaging studies CBC: Recent Labs  Lab 11/09/23 1650 11/10/23 1934 11/11/23 0749 11/12/23 0434  WBC 12.6* 17.4* 14.3* 14.3*  NEUTROABS 9.8*  --   --   --   HGB 13.9 12.8* 12.6* 12.9*  HCT 42.4 40.1  40.7 41.0  MCV 99.1 102.0* 102.3* 102.5*  PLT 331 268 267 269   Basic Metabolic Panel: Recent Labs  Lab 11/09/23 1650 11/10/23 1934 11/11/23 0749 11/11/23 2016 11/12/23 0434  NA 140 137 139 139 139  K 3.6 4.0 3.8 3.7 3.8  CL 100 98 100 97* 96*  CO2 25 29 32 33* 32  GLUCOSE 186* 256* 270* 261* 237*  BUN 15 22 20 18 16   CREATININE 1.03 1.49* 1.28* 1.38* 1.33*  CALCIUM  10.1 8.9 8.6* 8.4* 8.4*  MG  --   --   --  1.7 2.0  PHOS  --   --   --   --  1.2*   GFR: Estimated Creatinine Clearance: 52.7 mL/min (A) (by C-G formula based on SCr of 1.33 mg/dL (H)). Liver Function Tests: Recent Labs  Lab 11/09/23 1650 11/10/23 1934 11/11/23 2016 11/12/23 0434  AST 46* 28 29 28   ALT 29 20 18 20   ALKPHOS 62 56 66 69  BILITOT 1.2 1.2 1.2 1.4*  PROT 7.1 6.7 6.8 7.0  ALBUMIN 3.5 3.4* 3.0* 3.0*   CBG: Recent Labs  Lab 11/12/23 0112 11/12/23 0402 11/12/23 0831 11/12/23 1119 11/12/23 1617  GLUCAP 208* 236* 234* 273* 230*    Recent Results (from the past 240 hours)  Resp panel by RT-PCR (RSV, Flu A&B, Covid) Anterior Nasal Swab     Status: None   Collection Time: 11/10/23 10:55 AM   Specimen: Anterior Nasal Swab  Result Value Ref Range Status   SARS Coronavirus 2 by RT PCR NEGATIVE NEGATIVE Final   Influenza A by PCR NEGATIVE NEGATIVE Final   Influenza B by PCR NEGATIVE NEGATIVE Final    Comment: (NOTE) The Xpert Xpress SARS-CoV-2/FLU/RSV plus assay is intended as an aid in the diagnosis of influenza from Nasopharyngeal swab specimens and should not be used as a sole basis for treatment. Nasal washings and aspirates are unacceptable for Xpert Xpress SARS-CoV-2/FLU/RSV testing.  Fact Sheet for Patients: BloggerCourse.com  Fact Sheet for Healthcare Providers: SeriousBroker.it  This test is not yet approved or cleared by the [place]  FDA and has been authorized for detection and/or diagnosis of SARS-CoV-2 by FDA under an  Emergency Use Authorization (EUA). This EUA will remain in effect (meaning this test can be used) for the duration of the COVID-19 declaration under Section 564(b)(1) of the Act, 21 U.S.C. section 360bbb-3(b)(1), unless the authorization is terminated or revoked.     Resp Syncytial Virus by PCR NEGATIVE NEGATIVE Final    Comment: (NOTE) Fact Sheet for Patients: BloggerCourse.com  Fact Sheet for Healthcare Providers: SeriousBroker.it  This  test is not yet approved or cleared by the [place]  FDA and has been authorized for detection and/or diagnosis of SARS-CoV-2 by FDA under an Emergency Use Authorization (EUA). This EUA will remain in effect (meaning this test can be used) for the duration of the COVID-19 declaration under Section 564(b)(1) of the Act, 21 U.S.C. section 360bbb-3(b)(1), unless the authorization is terminated or revoked.  Performed at [facility], [address] [place], [place] [address]      Radiology Studies: ECHOCARDIOGRAM COMPLETE Result Date: 11/12/2023    ECHOCARDIOGRAM REPORT   Patient Name:   [name] Date of Exam: 11/12/2023 Medical Rec #:  [id number]  Height:       70.0 in Accession #:    [id number] Weight:       200.0 lb Date of Birth:  1945/12/19  BSA:          2.087 m Patient Age:    77 years   BP:           134/80 mmHg Patient Gender: M          HR:           91 bpm. Exam Location:  [facility] Procedure: 2D Echo, Cardiac Doppler, Color Doppler and Intracardiac            Opacification Agent (Both Spectral and Color Flow Doppler were            utilized during procedure). Indications:     CHF  History:         Patient has prior history of Echocardiogram examinations, most                  recent 07/13/2022. CAD; Risk Factors:Dyslipidemia, Hypertension,                  Sleep Apnea and Diabetes. CKD STAGE 3.  Sonographer:     Philomena Daring Referring Phys:  [id number] BRENDA MORRISON Diagnosing Phys: Annabella Scarce MD IMPRESSIONS  1. Left ventricular ejection fraction, by estimation, is 35 to 40%. The left ventricle has moderately decreased function. The left ventricle demonstrates regional wall motion abnormalities (see scoring diagram/findings for description). Left ventricular  diastolic parameters are consistent with Grade I diastolic dysfunction (impaired relaxation). Elevated left ventricular end-diastolic pressure.  2. Right ventricular systolic function is normal. The right ventricular size is normal. There is normal pulmonary artery systolic pressure.  3. The mitral valve is normal in structure. No evidence of mitral valve regurgitation. No evidence of mitral stenosis.  4. The aortic valve is tricuspid. There is mild calcification of the aortic valve. There is mild thickening of the aortic valve. Aortic valve regurgitation is not visualized. No aortic stenosis is present.  5. The inferior vena cava is normal in size with greater than 50% respiratory variability, suggesting right atrial pressure of 3 mmHg. Comparison(s): A prior study was performed on 07/2022. Changes from prior study are noted. Compared with echo 07/2022, systolic dysfunction is new. FINDINGS  Left Ventricle: Left ventricular ejection fraction, by estimation, is 35 to 40%. The left ventricle has moderately decreased function. The left ventricle demonstrates regional wall motion abnormalities. The left ventricular internal cavity size was normal in size. There is no left ventricular hypertrophy. Left ventricular diastolic parameters are consistent with Grade I diastolic dysfunction (impaired relaxation). Elevated left ventricular end-diastolic pressure.  LV Wall Scoring: The entire anterior wall, mid and distal anterior septum, inferior septum, posterior wall, and apex are hypokinetic. The  antero-lateral wall, entire inferior wall, basal anteroseptal segment, and apical lateral segment are normal. Right Ventricle: The right ventricular size is  normal. No increase in right ventricular wall thickness. Right ventricular systolic function is normal. There is normal pulmonary artery systolic pressure. The tricuspid regurgitant velocity is 1.24 m/s, and  with an assumed right atrial pressure of 3 mmHg, the estimated right ventricular systolic pressure is 9.2 mmHg. Left Atrium: Left atrial size was normal in size. Right Atrium: Right atrial size was normal in size. Pericardium: There is no evidence of pericardial effusion. Mitral Valve: The mitral valve is normal in structure. No evidence of mitral valve regurgitation. No evidence of mitral valve stenosis. Tricuspid Valve: The tricuspid valve is normal in structure. Tricuspid valve regurgitation is trivial. No evidence of tricuspid stenosis. Aortic Valve: The aortic valve is tricuspid. There is mild calcification of the aortic valve. There is mild thickening of the aortic valve. Aortic valve regurgitation is not visualized. No aortic stenosis is present. Pulmonic Valve: The pulmonic valve was normal in structure. Pulmonic valve regurgitation is mild. No evidence of pulmonic stenosis. Aorta: The aortic root is normal in size and structure. Venous: The inferior vena cava is normal in size with greater than 50% respiratory variability, suggesting right atrial pressure of 3 mmHg. IAS/Shunts: No atrial level shunt detected by color flow Doppler.  LEFT VENTRICLE PLAX 2D LVIDd:         5.60 cm   Diastology LVIDs:         4.50 cm   LV e' medial:    0.04 cm/s LV PW:         1.20 cm   LV E/e' medial:  15.8 LV IVS:        1.00 cm   LV e' lateral:   0.07 cm/s LVOT diam:     1.80 cm   LV E/e' lateral: 9.7 LV SV:         44 LV SV Index:   21 LVOT Area:     2.54 cm  RIGHT VENTRICLE             IVC RV S prime:     13.90 cm/s  IVC diam: 1.50 cm TAPSE (M-mode): 2.1 cm LEFT ATRIUM             Index        RIGHT ATRIUM           Index LA diam:        3.70 cm 1.77 cm/m   RA Area:     12.80 cm LA Vol (A2C):   45.0 ml 21.56 ml/m   RA Volume:   29.40 ml  14.09 ml/m LA Vol (A4C):   59.7 ml 28.60 ml/m LA Biplane Vol: 56.0 ml 26.83 ml/m  AORTIC VALVE LVOT Vmax:   104.00 cm/s LVOT Vmean:  68.200 cm/s LVOT VTI:    0.171 m  AORTA Ao Root diam: 2.70 cm MITRAL VALVE               TRICUSPID VALVE MV Area (PHT): 5.62 cm    TR Peak grad:   6.2 mmHg MV Decel Time: 135 msec    TR Vmax:        124.00 cm/s MV E velocity: 0.69 cm/s MV A velocity: 97.20 cm/s  SHUNTS MV E/A ratio:  0.01        Systemic VTI:  0.17 m  Systemic Diam: 1.80 cm Annabella Scarce MD Electronically signed by Annabella Scarce MD Signature Date/Time: 11/12/2023/10:26:41 AM    Final    DG Chest Port 1 View Result Date: 11/12/2023 CLINICAL DATA:  [id number].  Follow-up exam. EXAM: PORTABLE CHEST 1 VIEW COMPARISON:  Portable chest yesterday at 7:06 a.m. FINDINGS: 6:03 a.m. There is patchy airspace disease in both lung bases. Overall aeration is unchanged with remaining lungs clear. Minimal pleural effusions. Stable cardiomegaly. No vascular congestion is seen. Stable mediastinum and osseous structures. IMPRESSION: 1. No significant change in patchy airspace disease in both lung bases. 2. Stable cardiomegaly. 3. Minimal pleural effusions. Electronically Signed   By: Francis Quam M.D.   On: 11/12/2023 07:56   CT Angio Chest Pulmonary Embolism (PE) W or WO Contrast Result Date: 11/12/2023 EXAM: CTA of the Chest with contrast for PE 11/12/2023 05:42:36 AM TECHNIQUE: CTA of the chest was performed after the administration of intravenous contrast. Multiplanar reformatted images are provided for review. MIP images are provided for review. Automated exposure control, iterative reconstruction, and/or weight based adjustment of the mA/kV was utilized to reduce the radiation dose to as low as reasonably achievable. COMPARISON: CT of the chest dated 08/02/2022. CLINICAL HISTORY: Pulmonary embolism (PE) suspected, low to intermediate prob, positive D-dimer. FINDINGS:  PULMONARY ARTERIES: Pulmonary arteries are adequately opacified for evaluation. No pulmonary embolism. Main pulmonary artery is normal in caliber. MEDIASTINUM: The heart and pericardium demonstrate no acute abnormality. There is moderate calcific coronary artery disease. There is mild-to-moderate calcific atheromatous disease within the aortic arch. LYMPH NODES: No mediastinal, hilar or axillary lymphadenopathy. LUNGS AND PLEURA: There are dependent ground-glass opacities within the lungs bilaterally. There are streaky peribronchovascular opacities within the lower lobes bilaterally. There is a mild right pleural effusion and a tiny left effusion. UPPER ABDOMEN: The patient is status post cholecystectomy. SOFT TISSUES AND BONES: No acute bone or soft tissue abnormality. IMPRESSION: 1. No evidence of pulmonary embolism. 2. Mild right pleural effusion and tiny left effusion. 3. Peribronchial opacities, compatible with bronchitis/bronchiolitis. Electronically signed by: Evalene Coho MD 11/12/2023 05:52 AM EDT RP Workstation: [id number]   CT HEAD WO CONTRAST ( ) Result Date: 11/11/2023 CLINICAL DATA:  Encephalopathy. EXAM: CT HEAD WITHOUT CONTRAST TECHNIQUE: Contiguous axial images were obtained from the base of the skull through the vertex without intravenous contrast. RADIATION DOSE REDUCTION: This exam was performed according to the departmental dose-optimization program which includes automated exposure control, adjustment of the mA and/or kV according to patient size and/or use of iterative reconstruction technique. COMPARISON:  None Available. FINDINGS: Brain: There is no evidence of an acute infarct, intracranial hemorrhage, mass, midline shift, or extra-axial fluid collection. There is mild cerebral atrophy. Cerebral white matter hypodensities are nonspecific but compatible with mild chronic small vessel ischemic disease. Vascular: Calcified atherosclerosis at the skull base. No hyperdense vessel. Skull:  No acute fracture or suspicious lesion. Sinuses/Orbits: Small volume secretions in the right sphenoid sinus. Small bilateral mastoid effusions. Bilateral cataract extraction. Other: None. IMPRESSION: 1. No evidence of acute intracranial abnormality. 2. Mild chronic small vessel ischemic disease. Electronically Signed   By: Dasie Hamburg M.D.   On: 11/11/2023 19:05   DG Chest 1 View Result Date: 11/11/2023 CLINICAL DATA:  Congestive heart failure. EXAM: CHEST  1 VIEW COMPARISON:  November 10, 2023. FINDINGS: Stable cardiomegaly. Mild right basilar subsegmental atelectasis or infiltrate is noted. Left lung is clear. Bony thorax is unremarkable. IMPRESSION: Mild right basilar subsegmental atelectasis or infiltrate. Electronically Signed   By: Lynwood  Landy Raddle M.D.   On: 11/11/2023 09:10   DG Chest 1 View Result Date: 11/10/2023 CLINICAL DATA:  Pneumonia, vomiting, shortness of breath EXAM: CHEST  1 VIEW COMPARISON:  11/09/2023 FINDINGS: Cardiomegaly. Bilateral lower lobe airspace opacities, right greater than left concerning for pneumonia. No visible effusions. No acute bony abnormality. IMPRESSION: Bilateral lower lobe airspace opacities, right greater than left concerning for pneumonia. This is progressed since prior study. Electronically Signed   By: Franky Crease M.D.   On: 11/10/2023 18:12    Scheduled Meds:  aspirin  EC  81 mg Oral Daily   clopidogrel   75 mg Oral Daily   ezetimibe   10 mg Oral Daily   guaiFENesin   600 mg Oral BID   hydrALAZINE   10 mg Oral BID   insulin  aspart  0-20 Units Subcutaneous Q4H   ipratropium-albuterol   3 mL Nebulization BID   isosorbide  mononitrate  30 mg Oral Daily   levothyroxine   50 mcg Oral Q0600   methylPREDNISolone  (SOLU-MEDROL ) injection  40 mg Intravenous Q12H   [START ON 11/13/2023] metoprolol  succinate  25 mg Oral Daily   pantoprazole  (PROTONIX ) IV  40 mg Intravenous QHS   rosuvastatin   5 mg Oral QHS   [START ON 11/13/2023] sacubitril -valsartan   1 tablet Oral BID    Continuous Infusions:  heparin  1,450 Units/hr (11/12/23 1710)   levofloxacin  (LEVAQUIN ) IV Stopped (11/11/23 2354)     LOS: 1 day  MDM: Patient is high risk for one or more organ failure.  They necessitate ongoing hospitalization for continued IV therapies and subsequent lab monitoring. Total time spent interpreting labs and vitals, reviewing the medical record, coordinating care amongst consultants and care team members, directly assessing and discussing care with the patient and/or family: 55 min  Laree Lock, MD [facility]  To contact the attending physician between 7A-7P please use Epic Chat. To contact the covering physician during after hours 7P-7A, please review Amion.  11/12/2023, 5:27 PM   *This document has been created with the assistance of dictation software. Please excuse typographical errors. *

## 2023-11-12 NOTE — Plan of Care (Signed)

## 2023-11-12 NOTE — Progress Notes (Signed)
 PHARMACY - ANTICOAGULATION CONSULT NOTE  Pharmacy Consult for Heparin  Infusion Indication: chest pain/ACS  Allergies  Allergen Reactions   Statins Other (See Comments)    Aggression  Pt ok to take crestor  20mg  once a week   Hydrocortisone  Nausea Only   Ranexa [Ranolazine] Other (See Comments)    Severe weakness/near syncope after 1 dose Hallucinations    Doxycycline  Diarrhea   Hydrocodone  Nausea And Vomiting   Miralax  [Polyethylene Glycol] Nausea And Vomiting   Z-Pak [Azithromycin ] Other (See Comments)    Raises blood sugar   Zaroxolyn  [Metolazone ] Other (See Comments)    Drained, no energy    Patient Measurements:    Vital Signs: Pulse Rate: 82 (09/07 0751)  Labs: Recent Labs    11/10/23 1934 11/11/23 0749 11/11/23 2016 11/11/23 2159 11/11/23 2244 11/12/23 0434 11/12/23 0700 11/12/23 1630  HGB 12.8* 12.6*  --   --   --  12.9*  --   --   HCT 40.1 40.7  --   --   --  41.0  --   --   PLT 268 267  --   --   --  269  --   --   HEPARINUNFRC  --   --   --   --   --   --  0.19* 0.57  CREATININE 1.49* 1.28* 1.38*  --   --  1.33*  --   --   CKTOTAL  --   --   --  72  --   --   --   --   TROPONINIHS  --   --  1,170*  --  992* 753*  --   --     Estimated Creatinine Clearance: 52.7 mL/min (A) (by C-G formula based on SCr of 1.33 mg/dL (H)).   Medical History: Past Medical History:  Diagnosis Date   Adrenal insufficiency (HCC)    Allergy    Anemia    Arthritis    feet    Broken foot 09/2019   had to wore a boot. Left    CKD (chronic kidney disease), stage III (HCC)    Coronary artery disease    has stents   Coronary atherosclerosis of native coronary artery    Proximal LAD, posterior lateral stent widely patent-10/12/11   Diabetes mellitus    insulin  and pills   Hearing loss    wears hearing aids   History of blood transfusion 06/30/2016   [name] - 2 units transfused   Hyperlipidemia    Hypertension    Hypoxia 11/10/2023   OSA (obstructive sleep  apnea)    uses VPAC sleep study 2 years done through [place]. Dr. Jeffrie arranged study   PVD (peripheral vascular disease) (HCC)    Sarcoidosis    Sleep apnea    uses CPAP nightly   Thyroid  disease    Type 2 diabetes mellitus [facility])    Assessment: Patient is a 78yo male admitted from [facility]. Patient with elevated troponin. Pharmacy consulted for Heparin  dosing. No prior anticoagulants noted in history.  9/7 0700 HL 0.19,  Subtherapeutic 9/7 1630 HL 0.57,  Therapeutic x1  Goal of Therapy:  Heparin  level 0.3-0.7 units/ml Monitor platelets by anticoagulation protocol: Yes   Plan:  Continue heparin  infusion at 1450 units/hr Check anti-Xa level in 8 hours to confirm and daily while on heparin  Continue to monitor H&H and platelets daily  Thank you for involving pharmacy in this patient's care.   Damien Napoleon, PharmD Clinical Pharmacist  11/12/2023 5:31 PM

## 2023-11-12 NOTE — Progress Notes (Signed)
 PHARMACY - ANTICOAGULATION CONSULT NOTE  Pharmacy Consult for Heparin  Infusion Indication: chest pain/ACS  Allergies  Allergen Reactions   Statins Other (See Comments)    Aggression  Pt ok to take crestor  20mg  once a week   Hydrocortisone  Nausea Only   Ranexa [Ranolazine] Other (See Comments)    Severe weakness/near syncope after 1 dose Hallucinations    Doxycycline  Diarrhea   Hydrocodone  Nausea And Vomiting   Miralax  [Polyethylene Glycol] Nausea And Vomiting   Z-Pak [Azithromycin ] Other (See Comments)    Raises blood sugar   Zaroxolyn  [Metolazone ] Other (See Comments)    Drained, no energy    Patient Measurements:    Vital Signs: Temp: 99.8 F (37.7 C) (09/07 0407) Temp Source: Oral (09/07 0407) BP: 134/80 (09/07 0407) Pulse Rate: 93 (09/07 0407)  Labs: Recent Labs    11/10/23 1934 11/11/23 0749 11/11/23 2016 11/11/23 2159 11/11/23 2244 11/12/23 0434 11/12/23 0700  HGB 12.8* 12.6*  --   --   --  12.9*  --   HCT 40.1 40.7  --   --   --  41.0  --   PLT 268 267  --   --   --  269  --   HEPARINUNFRC  --   --   --   --   --   --  0.19*  CREATININE 1.49* 1.28* 1.38*  --   --  1.33*  --   CKTOTAL  --   --   --  72  --   --   --   TROPONINIHS  --   --  1,170*  --  992* 753*  --     Estimated Creatinine Clearance: 52.7 mL/min (A) (by C-G formula based on SCr of 1.33 mg/dL (H)).   Medical History: Past Medical History:  Diagnosis Date   Adrenal insufficiency (HCC)    Allergy    Anemia    Arthritis    feet    Broken foot 09/2019   had to wore a boot. Left    CKD (chronic kidney disease), stage III (HCC)    Coronary artery disease    has stents   Coronary atherosclerosis of native coronary artery    Proximal LAD, posterior lateral stent widely patent-10/12/11   Diabetes mellitus    insulin  and pills   Hearing loss    wears hearing aids   History of blood transfusion 06/30/2016   [name] - 2 units transfused   Hyperlipidemia    Hypertension     Hypoxia 11/10/2023   OSA (obstructive sleep apnea)    uses VPAC sleep study 2 years done through [place]. Dr. Jeffrie arranged study   PVD (peripheral vascular disease) (HCC)    Sarcoidosis    Sleep apnea    uses CPAP nightly   Thyroid  disease    Type 2 diabetes mellitus [facility])    Assessment: Patient is a 78yo male admitted from Behavioral Medicine Unit. Patient with elevated troponin. Pharmacy consulted for Heparin  dosing. No prior anticoagulants noted in history.  9/7 0700 HL 0.19,  Subtherapeutic   Goal of Therapy:  Heparin  level 0.3-0.7 units/ml Monitor platelets by anticoagulation protocol: Yes   Plan:  9/7 0700 HL 0.19,  Subtherapeutic Will order 2700 units bolus x 1 increase heparin  infusion to 1450 units/hr Check anti-Xa level in 8 hours and daily while on heparin  Continue to monitor H&H and platelets daily  Allean Haas PharmD Clinical Pharmacist 11/12/2023

## 2023-11-13 ENCOUNTER — Inpatient Hospital Stay

## 2023-11-13 ENCOUNTER — Other Ambulatory Visit (HOSPITAL_COMMUNITY): Payer: Self-pay

## 2023-11-13 ENCOUNTER — Telehealth (HOSPITAL_COMMUNITY): Payer: Self-pay | Admitting: Pharmacy Technician

## 2023-11-13 DIAGNOSIS — S0990XA Unspecified injury of head, initial encounter: Secondary | ICD-10-CM | POA: Diagnosis not present

## 2023-11-13 DIAGNOSIS — I214 Non-ST elevation (NSTEMI) myocardial infarction: Secondary | ICD-10-CM | POA: Diagnosis not present

## 2023-11-13 DIAGNOSIS — J690 Pneumonitis due to inhalation of food and vomit: Secondary | ICD-10-CM | POA: Diagnosis not present

## 2023-11-13 DIAGNOSIS — I25118 Atherosclerotic heart disease of native coronary artery with other forms of angina pectoris: Secondary | ICD-10-CM | POA: Diagnosis not present

## 2023-11-13 LAB — GLUCOSE, CAPILLARY
Glucose-Capillary: 205 mg/dL — ABNORMAL HIGH (ref 70–99)
Glucose-Capillary: 163 mg/dL — ABNORMAL HIGH (ref 70–99)
Glucose-Capillary: 308 mg/dL — ABNORMAL HIGH (ref 70–99)
Glucose-Capillary: 253 mg/dL — ABNORMAL HIGH (ref 70–99)
Glucose-Capillary: 219 mg/dL — ABNORMAL HIGH (ref 70–99)
Glucose-Capillary: 251 mg/dL — ABNORMAL HIGH (ref 70–99)

## 2023-11-13 LAB — BASIC METABOLIC PANEL WITH GFR
Anion gap: 9 (ref 5–15)
BUN: 26 mg/dL — ABNORMAL HIGH (ref 8–23)
CO2: 31 mmol/L (ref 22–32)
Calcium: 8.1 mg/dL — ABNORMAL LOW (ref 8.9–10.3)
Chloride: 91 mmol/L — ABNORMAL LOW (ref 98–111)
Creatinine, Ser: 1.52 mg/dL — ABNORMAL HIGH (ref 0.61–1.24)
GFR, Estimated: 47 mL/min — ABNORMAL LOW (ref >60)
Glucose, Bld: 194 mg/dL — ABNORMAL HIGH (ref 70–99)
Potassium: 3.5 mmol/L (ref 3.5–5.1)
Sodium: 131 mmol/L — ABNORMAL LOW (ref 135–145)

## 2023-11-13 LAB — LIPID PANEL
Cholesterol: 165 mg/dL (ref 0–200)
HDL: 34 mg/dL — ABNORMAL LOW (ref >40)
LDL Cholesterol: 108 mg/dL — ABNORMAL HIGH (ref 0–99)
Total CHOL/HDL Ratio: 4.9 ratio
Triglycerides: 117 mg/dL (ref <150)
VLDL: 23 mg/dL (ref 0–40)

## 2023-11-13 LAB — HEPARIN LEVEL (UNFRACTIONATED): Heparin Unfractionated: 0.37 [IU]/mL (ref 0.30–0.70)

## 2023-11-13 LAB — MAGNESIUM: Magnesium: 2 mg/dL (ref 1.7–2.4)

## 2023-11-13 LAB — CBC
HCT: 37.1 % — ABNORMAL LOW (ref 39.0–52.0)
Hemoglobin: 12.2 g/dL — ABNORMAL LOW (ref 13.0–17.0)
MCH: 32.6 pg (ref 26.0–34.0)
MCHC: 32.9 g/dL (ref 30.0–36.0)
MCV: 99.2 fL (ref 80.0–100.0)
Platelets: 249 K/uL (ref 150–400)
RBC: 3.74 MIL/uL — ABNORMAL LOW (ref 4.22–5.81)
RDW: 13.2 % (ref 11.5–15.5)
WBC: 19.3 K/uL — ABNORMAL HIGH (ref 4.0–10.5)
nRBC: 0 % (ref 0.0–0.2)

## 2023-11-13 LAB — PHOSPHORUS: Phosphorus: 2.3 mg/dL — ABNORMAL LOW (ref 2.5–4.6)

## 2023-11-13 MED ORDER — LEVOFLOXACIN IN D5W 500 MG/100ML IV SOLN
500.0000 mg | INTRAVENOUS | Status: AC
  Administered 2023-11-13 – 2023-11-15 (×3): 500 mg via INTRAVENOUS
  Filled 2023-11-13 (×3): qty 100

## 2023-11-13 MED ORDER — POTASSIUM & SODIUM PHOSPHATES 280-160-250 MG PO PACK
1.0000 | PACK | Freq: Three times a day (TID) | ORAL | Status: AC
  Administered 2023-11-13 (×3): 1 via ORAL
  Filled 2023-11-13 (×3): qty 1

## 2023-11-13 MED ORDER — POTASSIUM CHLORIDE CRYS ER 20 MEQ PO TBCR
40.0000 meq | EXTENDED_RELEASE_TABLET | Freq: Once | ORAL | Status: AC
  Administered 2023-11-13: 40 meq via ORAL
  Filled 2023-11-13: qty 2

## 2023-11-13 NOTE — Progress Notes (Signed)
 Heart Failure Stewardship Pharmacy Note  PCP: Arloa Elsie SAUNDERS, MD PCP-Cardiologist: Oneil Parchment, MD  HPI: [name] is a 78 y.o. male with adrenal insufficiency, chronic kidney disease stage III, heart failure with reduced ejection fraction, coronary artery disease, PAD, insulin -dependent diabetes, chronic hearing loss, hyperlipidemia, hypothyroidism, iron  deficiency anemia, lumbar degenerative disc disease with radiculopathy, chronic eczema, history of Charcot foot of the left, history of diabetic neuropathy, obstructive sleep apnea and sarcoidosis, essential hypertension who presented with suicidal ideation. On admission, BNP was 838.2, AM cortisol 13.7, UDS negative, lactate 1.4, HS-troponin was 1170 > 992 > 753, and TSH 3.163. On 11/10/23, patient became hypoxic and experienced vomiting. Chest x-ray noted bilateral lower lobe airspace opacities, right greater than left concerning for pneumonia suspected to be aspiration PNA.   Pertinent cardiac history: CAD with DES to LAD and PLA in 2011, DESx2 right posterior dedscending artery in 08/2018, DES to pRCA mRCA and 1st diag in 01/2021. Most recent LHC 08/2021 at [facility] showed nonobstructive CAD and no interventions were performed. TTE 11/2023 with newly reduced LVEF to 35-40% with G1DD.   Pertinent Lab Values: Creat  Date Value Ref Range Status  01/09/2020 2.17 (H) 0.70 - 1.18 mg/dL Final    Comment:    For patients >65 years of age, the reference limit for Creatinine is approximately 13% higher for people identified as African-American. .    Creatinine, Ser  Date Value Ref Range Status  11/13/2023 1.52 (H) 0.61 - 1.24 mg/dL Final   BUN  Date Value Ref Range Status  11/13/2023 26 (H) 8 - 23 mg/dL Final  89/75/7976 19 8 - 27 mg/dL Final   Potassium  Date Value Ref Range Status  11/13/2023 3.5 3.5 - 5.1 mmol/L Final   Sodium  Date Value Ref Range Status  11/13/2023 131 (L) 135 - 145 mmol/L Final    Comment:    REPEATED TO VERIFY  AB  12/28/2021 142 134 - 144 mmol/L Final   B Natriuretic Peptide  Date Value Ref Range Status  11/11/2023 838.2 (H) 0.0 - 100.0 pg/mL Final    Comment:    Performed at [facility], [address] [place], [place] [address]   Magnesium   Date Value Ref Range Status  11/13/2023 2.0 1.7 - 2.4 mg/dL Final    Comment:    Performed at [facility], [address] [place], [place] [address]   Hgb A1c MFr Bld  Date Value Ref Range Status  10/09/2023 10.6 (H) 4.8 - 5.6 % Final    Comment:    (NOTE)         Prediabetes: 5.7 - 6.4         Diabetes: >6.4         Glycemic control for adults with diabetes: <7.0    TSH  Date Value Ref Range Status  11/11/2023 3.163 0.350 - 4.500 uIU/mL Final    Comment:    Performed by a 3rd Generation assay with a functional sensitivity of <=0.01 uIU/mL. Performed at [facility], [address] [place], [place] [address]   07/27/2018 6.200 (H) 0.450 - 4.500 uIU/mL Final   LDH  Date Value Ref Range Status  11/13/2019 181 98 - 192 U/L Final    Comment:    Performed at [facility], [address] [place], [place] [address]    Vital Signs:  Temp:  [96.3 F (35.7 C)-98.2 F (36.8 C)] 98.2 F (36.8 C) (09/08 0758) Pulse Rate:  [  89-102] 93 (09/08 0758) Cardiac Rhythm: Normal sinus rhythm (09/08 0700) Resp:  [16-17] 17 (09/08 0758) BP: (101-123)/(68-72) 120/72 (09/08 0758) SpO2:  [93 %-100 %] 93 % (09/08 0758) FiO2 (%):  [28 %] 28 % (09/07 2312)  Intake/Output Summary (Last 24 hours) at 11/13/2023 9177 Last data filed at 11/13/2023 9662 Gross per 24 hour  Intake 1056.08 ml  Output 900 ml  Net 156.08 ml   Current Heart Failure Medications:  Loop diuretic: none Beta-Blocker: metoprolol  succinate 25 mg daily ACEI/ARB/ARNI: Entresto  24-26 mg BID MRA: none SGLT2i: none Other: hydralazine  10 mg BID  Prior to admission Heart Failure Medications:  Loop diuretic: furosemide  80 mg  prn Beta-Blocker: none ACEI/ARB/ARNI: losartan  100 mg daily MRA: none SGLT2i: none Other: hydralazine  10 mg BID, amlodipine   Assessment: 1. Acute combined systolic and diastolic heart failure (LVEF 35-40%) with G1DD, due to ICM. NYHA class II-III symptoms.  -Symptoms: Reports feeling well overall. Denies any symptoms at this time. -Volume: Appears to be euvolemic. No indication for loop diuretics at this time.  -Hemodynamics: BP stable and HR ~90.  -BB: Continue metoprolol  succinate 25 mg daily. -ACEI/ARB/ARNI: Continue Entresto  24-26 mg BID. -MRA: Can consider adding when creatinine is trending down if BP stable. -SGLT2i: WBC trending up, possibly due to steroids which were decreased today. Would consider adding Jardiance tomorrow if improving. Jardiance is significantly cheaper for this patient than [place]. -PTA amlodipine  stopped. Would not resume at discharge. Plan: 1) Medication changes recommended at this time: -None at this time. [name] is the preferred SGLT2i for this patient due to cost.  2) Patient assistance: -Generic Entresto  copay is $47.00, [name] copay is $253.82, Jardiance copay is $47.00   3) Education: - Patient has been educated on current HF medications and potential additions to HF medication regimen - Patient verbalizes understanding that over the next few months, these medication doses may change and more medications may be added to optimize HF regimen - Patient has been educated on basic disease state pathophysiology and goals of therapy  Medication Assistance / Insurance Benefits Check: Does the patient have prescription insurance?    Type of insurance plan:  Does the patient qualify for medication assistance through manufacturers or grants? Pending   Outpatient Pharmacy: [facility] to admission [facility]      Please do not hesitate to reach out with questions or concerns,  Jaun Bash, PharmD, CPP, BCPS, [facility] Heart Failure Pharmacist   Phone - [phone] 11/13/2023 11:49 AM

## 2023-11-13 NOTE — Inpatient Diabetes Management (Signed)
 Inpatient Diabetes Program Recommendations  AACE/ADA: New Consensus Statement on Inpatient Glycemic Control   Target Ranges:  Prepandial:   less than 140 mg/dL      Peak postprandial:   less than 180 mg/dL (1-2 hours)      Critically ill patients:  140 - 180 mg/dL    Latest Reference Range & Units 11/13/23 03:09 11/13/23 07:37 11/13/23 11:42  Glucose-Capillary 70 - 99 mg/dL 794 (H) 836 (H) 691 (H)    Latest Reference Range & Units 11/12/23 08:31 11/12/23 11:19 11/12/23 16:17 11/12/23 19:58 11/12/23 23:23  Glucose-Capillary 70 - 99 mg/dL 765 (H) 726 (H) 769 (H) 300 (H) 276 (H)   Review of Glycemic Control  Diabetes history: DM2 Outpatient Diabetes medications: 70/30 30-40 units (frequency depends on how many times he eats a day); Prednisone  10 mg QHS  Current orders for Inpatient glycemic control: Lantus  5 units daily, Novolog  0-290 units Q4H; Prednisone  10 mg QAM  Inpatient Diabetes Program Recommendations:    Insulin : Noted steroids changed from Solumedrol to Prednisone . If post prandial glucose remains consistently over 180 mg/dl, please consider ordering Novolog  4 units TID with meals for meal coverage if patient eats at least 50% of meals.  Thanks, Earnie Gainer, RN, MSN, CDCES Diabetes Coordinator Inpatient Diabetes Program [phone] (Team Pager from 8am to 5pm)

## 2023-11-13 NOTE — Progress Notes (Signed)
 Heart Failure Nurse Navigator Progress Note  PCP: Arloa Elsie SAUNDERS, MD PCP-Cardiologist: Crawford Augusto DASEN, MD Admission Diagnosis: Drug Overdose and Suicide Attempt/Aspiration Pneumonia Admitted from: [facility] to [facility]- Transfer to [facility]  Presentation:   [name] presented due to transfer from [facility]. Pt has history of adrenal insufficiency, chronic kidney disease stage III, heart failure with reduced ejection fraction, CAD with lesion to LAD, PAD complex stenting done 4 weeks ago, diabetes, chronic hearing loss, hyperlipidemia, hypothyroidism, iron  deficiency anemia, lumbar degenerative disc disease with radiculopathy, chronic eczema, Charcot left foot, diabetic neuropathy, obstructive sleep apnea and sarcoidosis, essential hypertension.  Patient had been admitted inpatient for suicide ideation (took unknown quantity of tramadol tablets in an attempt to kill himself).  He was given Narcan  by EMS with improvement of mental and respiratory status. He denied suicidal thoughts. Wife thought his circumstance might be depressed secondary to troubles with his left foot.  While at [facility] he became hypoxic and rapid response called.  1 episode of vomiting.  Placed on 2 L [place] and given Duoneb and Lasix .  Started on empiric antibiotic therapy for possible aspiration pneumonia.  HS-Troponin 753. BNP 838.2. Chest x-ray: 11/09/23 Stable cardiomegaly. Subsegmental atelectasis or scarring. Transferred to [facility].  ECHO/ LVEF: 35-40%  Clinical Course:  Past Medical History:  Diagnosis Date   Adrenal insufficiency (HCC)    Allergy    Anemia    Arthritis    feet    Broken foot 09/2019   had to wore a boot. Left    CKD (chronic kidney disease), stage III (HCC)    Coronary artery disease    has stents   Coronary atherosclerosis of native coronary artery    Proximal LAD, posterior lateral stent widely patent-10/12/11   Diabetes mellitus    insulin  and pills   Hearing loss    wears hearing  aids   History of blood transfusion 06/30/2016   [name] - 2 units transfused   Hyperlipidemia    Hypertension    Hypoxia 11/10/2023   OSA (obstructive sleep apnea)    uses VPAC sleep study 2 years done through [place]. Dr. 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   Occupation: MANAGER    Employer: [employer]  Tobacco Use   Smoking status: Never   Smokeless tobacco: Never  Vaping Use   Vaping status: Never Used  Substance and Sexual Activity   Alcohol use: No   Drug use: No   Sexual activity: Not on file  Other Topics Concern   Not on file  Social History Narrative   Not on file   Social Drivers of Health   Financial Resource Strain: Low Risk  (02/08/2020)   Received from [facility]   Overall Financial Resource Strain (CARDIA)    Difficulty of Paying Living Expenses: Not hard at all  Food Insecurity: No Food Insecurity (11/12/2023)   Hunger Vital Sign    Worried About Running Out of Food  in the Last Year: Never true    Ran Out of Food in the Last Year: Never true  Transportation Needs: No Transportation Needs (11/12/2023)   PRAPARE - Administrator, Civil Service (Medical): No    Lack of Transportation (Non-Medical): No  Physical Activity: Not on file  Stress: No Stress Concern Present (10/15/2023)   Received from [facility] - Occupational Stress Questionnaire    Do you feel stress - tense, restless, nervous, or anxious, or unable to sleep at night because your mind is troubled all the time - these days?:  Not at all  Social Connections: Moderately Integrated (11/12/2023)   Social Connection and Isolation Panel    Frequency of Communication with Friends and Family: Once a week    Frequency of Social Gatherings with Friends and Family: Once a week    Attends Religious Services: 1 to 4 times per year    Active Member of [employer] or Organizations: Yes    Attends Engineer, structural: More than 4 times per year    Marital Status: Married   Water engineer and Provision:  Detailed education and instructions provided on heart failure disease management including the following:  Signs and symptoms of Heart Failure When to call the physician Importance of daily weights Low sodium diet Fluid restriction Medication management Anticipated future follow-up appointments  Patient education given on each of the above topics.  Patient acknowledges understanding via teach back method and acceptance of all instructions.  Education Materials:  Living Better With Heart Failure Booklet, HF zone tool, & Daily Weight Tracker Tool.  Patient has scale at home: Yes Patient has pill box at home: Yes.  But doesn't use all the time because his medications have been fluctuating.    High Risk Criteria for Readmission and/or Poor Patient Outcomes: Heart failure hospital admissions (last 6 months): 0  No Show rate: 1% Difficult social situation: Hearing impaired.  Wears Hearing Aids Demonstrates medication adherence: Yes Primary Language: English Literacy level: Reading, Writing, & Comprehension  Barriers of Care:   Hearing Impaired-Wears Hearing Aids  Considerations/Referrals:   Referral made to Heart Failure Pharmacist Stewardship: Yes Referral made to Heart Failure CSW/NCM TOC: No Referral made to Heart & Vascular TOC clinic: Yes.  11/24/23 @ 10:30 [facility]  Items for Follow-up on DC/TOC: Daily Weights Diet & Fluid Restrictions Continued Heart Failure Education  Charmaine Pines,  RN, BSN [facility] Heart Failure Navigator Secure Chat Only

## 2023-11-13 NOTE — Progress Notes (Signed)
 PROGRESS NOTE    [name]  FMW:[id number] DOB: 10/29/1945 DOA: 11/11/2023 PCP: Arloa Elsie SAUNDERS, MD  No chief complaint on file.   Hospital Course:  [name] is a 78 y.o. male with medical history significant of adrenal insufficiency, chronic kidney disease stage III, heart failure with reduced ejection fraction EF of 45 to 50%, coronary artery disease with lesion to LAD, PAD complex stenting done recently 4 weeks ago, history of beta-blockers insulin -dependent diabetes, chronic hearing loss, hyperlipidemia, hypothyroidism, iron  deficiency anemia, lumbar degenerative disc disease with radiculopathy, chronic eczema, history of Charcot foot of the left, history of diabetic neuropathy obstructive sleep apnea and sarcoidosis, essential hypertension who was admitted  to inpatient psychiatric unit with suicide ideation.  Patient was involuntarily committed after suicide attempt for which he took 10 to 60 tablets of tramadol.  Patient was being evaluated and apparently has denied suicidal thoughts.    However patient became hypoxic and Rapid response was called 09/05  when he became hypoxic.  He had 1 episode of vomiting.  Was placed on 2 L of oxygen .  Also started on DuoNeb and Lasix .  Patient was started on empiric antibiotics therapy for possible aspiration pneumonia.  Workup was done including chest x-ray that suspected the pneumonia.  Due to concern for patient's multitude of medical problems he was transferred to acute medical care for management of the suspected aspiration pneumonia. Hospital course as below  Subjective: Patient was examined at the bedside, son Redell present States he feels much better, denies any new complaints today.  States has been having poor p.o. intake with water .  By psychiatry, off 1:1  Objective: Vitals:   11/12/23 2312 11/13/23 0305 11/13/23 0758 11/13/23 1545  BP:  101/68 120/72 (!) 119/51  Pulse: (!) 102 92 93 (!) 47  Resp:  16 17 16   Temp:  (!) 96.3 F (35.7  C) 98.2 F (36.8 C) 97.8 F (36.6 C)  TempSrc:  Axillary Oral Oral  SpO2: 94% 100% 93% 95%    Intake/Output Summary (Last 24 hours) at 11/13/2023 1627 Last data filed at 11/13/2023 1517 Gross per 24 hour  Intake 1215.19 ml  Output 1300 ml  Net -84.81 ml   There were no vitals filed for this visit.  Examination: Constitutional: weak, NAD, calm, comfortable Neck: normal, supple, no masses, no thyromegaly Respiratory: CTAB, normal respiratory effort. No accessory muscle use Cardiovascular: Regular rate and rhythm, no murmurs / rubs / gallops, no lower extremity edema Abdomen: no tenderness, no masses palpated. No hepatosplenomegaly. Bowel sounds positive.  Musculoskeletal: Good range of motion, no joint swelling or tenderness, Skin: no rashes, lesions, ulcers. No induration Neurologic: CN 2-12 grossly intact. Sensation intact, DTR normal. Strength 5/5 in all 4.  Psychiatric: Awake, alert, cooperative  Assessment & Plan:  Principal Problem:   Aspiration pneumonia (HCC) Active Problems:   Sarcoidosis   Coronary artery disease with exertional angina (HCC)   GERD   OSA (obstructive sleep apnea)   History of adrenal insufficiency   Pulmonary sarcoidosis (HCC)   Uncontrolled type 2 diabetes mellitus with hyperglycemia, with long-term current use of insulin  (HCC)   Hyperlipidemia   Stage 3a chronic kidney disease (CKD) (HCC)   Suicidal behavior with attempted self-injury (HCC)   Non-ST elevation (NSTEMI) myocardial infarction (HCC)   HFrEF (heart failure with reduced ejection fraction) (HCC)   Pressure injury of skin  Suicidal attempt vs Unintentional overdose Acute toxic encephalopathy due to tramadol overdose - s/p Narcan  in ER after suicide  attempt overdose on 10-60 tabs of tramadol - VBG with pCO2 of 63, likely due to tramadol - Mental status back to baseline - Off 1:1, cleared by Psychiatry, appreciate recs   Acute hypoxic respiratory failure - resolved - Suspect due to  tramadol overdose and aspiration pneumonia  Aspiration pneumonia - Suspect aspiration acute as patient was found hypoxic after having episode of emesis.  - Leukocytosis (was on steroids) - COVID/flu/RSV negative - CXR with bilateral lower lobe airspace opacities, R > left - Change po Augmentin  to IV Levaquin . - Will obtain blood cultures if patient continues to spike fever - Duonebs prn  NSTEMI CAD - Troponin elevated, now downtrending, reports having angina at times with exertion - CTA negative for PE - Echo showed EF 35 to 40% with hypokinesis of the anterior wall, mid and distal anterior septum, inferior septum, posterior wall, and apex, G1 DD, elevated LVEDP  - On IV heparin  gtt. for 48 hours - Add aspirin  to Plavix , DAPT for 1 year, Zetia , statin - Add Imdur  30 mg daily, metoprolol  succinate 25 mg daily - Seen by cardiology, appreciate recs, patient and spouse would not like to have LHC at this time, will consider after recovery from acute phase   Chronic HFmrEF - Appears to be euvolemic to mild fluid overload - Echo LVEF 45-50% with a septal hypokinesis of the mid to apical anterior/lateral segment - Appears euvolemic on exam - Started Entresto , metoprolol . recommend adding SGLT2 prior to discharge  CKD stage3 - Cr at baseline 1.2-1.5  Hypophosphatemia Hypokalemia - Monitor and replete as needed  Mild hyponatremia -Corrected sodium 133 - Monitor   HTN - Continue hydralazine , Imdur , Entresto , metoprolol  - Hold amlodipine , start Entresto  instead of losartan    HLD - Zetia , statin   Diabetes mellitus 2 - Hb A1c 10.6 (08/25) - Reports taking 70/30 at home 25u ? TID - Resume 70/30 tomorrow, SSI  - Monitor blood glucose and titrate as needed   S/p left foot wound debridement Recent left foot cellulitis - Completed antibiotics - Standing in the boot is allowed, but walking is not recommended  - Follow-up with podiatry outpatient   Normocytic anemia -Monitor Hb  intermittently   History of sarcoidosis - Continue prednisone  10 mg daily  Hx adrenal insufficiency: Continue prednisone    Hypothyroidism Continue levothyroxine    PT/OT rec Home PT/OT  DVT prophylaxis: Heparin  gtt   Code Status: Full Code Disposition:  Pending  Consultants:  Treatment Team:  Consulting Physician: Gollan, Timothy J, MD Psychiatry  Procedures:  None  Antimicrobials:  Anti-infectives (From admission, onward)    Start     Dose/Rate Route Frequency Ordered Stop   11/13/23 2200  levofloxacin  (LEVAQUIN ) IVPB 500 mg        500 mg 100 mL/hr over 60 Minutes Intravenous Every 24 hours 11/13/23 0737 11/16/23 2159   11/11/23 2200  doxycycline  (VIBRA -TABS) tablet 100 mg  Status:  Discontinued        100 mg Oral Every 12 hours 11/11/23 1927 11/11/23 1959   11/11/23 2200  cefTRIAXone  (ROCEPHIN ) 2 g in sodium chloride  0.9 % 100 mL IVPB  Status:  Discontinued        2 g 200 mL/hr over 30 Minutes Intravenous Every 24 hours 11/11/23 1953 11/11/23 2007   11/11/23 2200  doxycycline  (VIBRAMYCIN ) 100 mg in sodium chloride  0.9 % 250 mL IVPB  Status:  Discontinued        100 mg 125 mL/hr over 120 Minutes Intravenous Every 12 hours 11/11/23  1957 11/11/23 2130   11/11/23 2200  levofloxacin  (LEVAQUIN ) IVPB 750 mg  Status:  Discontinued        750 mg 100 mL/hr over 90 Minutes Intravenous Every 24 hours 11/11/23 2015 11/13/23 0737   11/11/23 2100  Ampicillin -Sulbactam (UNASYN ) 3 g in sodium chloride  0.9 % 100 mL IVPB  Status:  Discontinued        3 g 200 mL/hr over 30 Minutes Intravenous Every 6 hours 11/11/23 2007 11/11/23 2130   11/11/23 2015  cefTRIAXone  (ROCEPHIN ) 1 g in sodium chloride  0.9 % 100 mL IVPB  Status:  Discontinued        1 g 200 mL/hr over 30 Minutes Intravenous Every 24 hours 11/11/23 1927 11/11/23 1955       Data Reviewed: I have personally reviewed following labs and imaging studies CBC: Recent Labs  Lab 11/09/23 1650 11/10/23 1934 11/11/23 0749  11/12/23 0434 11/13/23 0321  WBC 12.6* 17.4* 14.3* 14.3* 19.3*  NEUTROABS 9.8*  --   --   --   --   HGB 13.9 12.8* 12.6* 12.9* 12.2*  HCT 42.4 40.1 40.7 41.0 37.1*  MCV 99.1 102.0* 102.3* 102.5* 99.2  PLT 331 268 267 269 249   Basic Metabolic Panel: Recent Labs  Lab 11/10/23 1934 11/11/23 0749 11/11/23 2016 11/12/23 0434 11/12/23 1939 11/13/23 0321  NA 137 139 139 139  --  131*  K 4.0 3.8 3.7 3.8  --  3.5  CL 98 100 97* 96*  --  91*  CO2 29 32 33* 32  --  31  GLUCOSE 256* 270* 261* 237*  --  194*  BUN 22 20 18 16   --  26*  CREATININE 1.49* 1.28* 1.38* 1.33*  --  1.52*  CALCIUM  8.9 8.6* 8.4* 8.4*  --  8.1*  MG  --   --  1.7 2.0  --  2.0  PHOS  --   --   --  1.2* 2.5 2.3*   GFR: Estimated Creatinine Clearance: 46.1 mL/min (A) (by C-G formula based on SCr of 1.52 mg/dL (H)). Liver Function Tests: Recent Labs  Lab 11/09/23 1650 11/10/23 1934 11/11/23 2016 11/12/23 0434  AST 46* 28 29 28   ALT 29 20 18 20   ALKPHOS 62 56 66 69  BILITOT 1.2 1.2 1.2 1.4*  PROT 7.1 6.7 6.8 7.0  ALBUMIN 3.5 3.4* 3.0* 3.0*   CBG: Recent Labs  Lab 11/12/23 2323 11/13/23 0309 11/13/23 0737 11/13/23 1142 11/13/23 1547  GLUCAP 276* 205* 163* 308* 253*    Recent Results (from the past 240 hours)  Resp panel by RT-PCR (RSV, Flu A&B, Covid) Anterior Nasal Swab     Status: None   Collection Time: 11/10/23 10:55 AM   Specimen: Anterior Nasal Swab  Result Value Ref Range Status   SARS Coronavirus 2 by RT PCR NEGATIVE NEGATIVE Final   Influenza A by PCR NEGATIVE NEGATIVE Final   Influenza B by PCR NEGATIVE NEGATIVE Final    Comment: (NOTE) The Xpert Xpress SARS-CoV-2/FLU/RSV plus assay is intended as an aid in the diagnosis of influenza from Nasopharyngeal swab specimens and should not be used as a sole basis for treatment. Nasal washings and aspirates are unacceptable for Xpert Xpress SARS-CoV-2/FLU/RSV testing.  Fact Sheet for  Patients: BloggerCourse.com  Fact Sheet for Healthcare Providers: SeriousBroker.it  This test is not yet approved or cleared by the [place]  FDA and has been authorized for detection and/or diagnosis of SARS-CoV-2 by FDA under an Emergency Use Authorization (EUA).  This EUA will remain in effect (meaning this test can be used) for the duration of the COVID-19 declaration under Section 564(b)(1) of the Act, 21 U.S.C. section 360bbb-3(b)(1), unless the authorization is terminated or revoked.     Resp Syncytial Virus by PCR NEGATIVE NEGATIVE Final    Comment: (NOTE) Fact Sheet for Patients: BloggerCourse.com  Fact Sheet for Healthcare Providers: SeriousBroker.it  This test is not yet approved or cleared by the [place]  FDA and has been authorized for detection and/or diagnosis of SARS-CoV-2 by FDA under an Emergency Use Authorization (EUA). This EUA will remain in effect (meaning this test can be used) for the duration of the COVID-19 declaration under Section 564(b)(1) of the Act, 21 U.S.C. section 360bbb-3(b)(1), unless the authorization is terminated or revoked.  Performed at [facility], [address], [place], [place] [address]      Radiology Studies: ECHOCARDIOGRAM COMPLETE Result Date: 11/12/2023    ECHOCARDIOGRAM REPORT   Patient Name:   SKYLEN SPIERING Date of Exam: 11/12/2023 Medical Rec #:  [id number]  Height:       70.0 in Accession #:    [id number] Weight:       200.0 lb Date of Birth:  09/04/1945  BSA:          2.087 m Patient Age:    77 years   BP:           134/80 mmHg Patient Gender: M          HR:           91 bpm. Exam Location:  [facility] Procedure: 2D Echo, Cardiac Doppler, Color Doppler and Intracardiac            Opacification Agent (Both Spectral and Color Flow Doppler were            utilized during procedure). Indications:     CHF  History:         Patient  has prior history of Echocardiogram examinations, most                  recent 07/13/2022. CAD; Risk Factors:Dyslipidemia, Hypertension,                  Sleep Apnea and Diabetes. CKD STAGE 3.  Sonographer:     Philomena Daring Referring Phys:  [id number] BRENDA MORRISON Diagnosing Phys: Annabella Scarce MD IMPRESSIONS  1. Left ventricular ejection fraction, by estimation, is 35 to 40%. The left ventricle has moderately decreased function. The left ventricle demonstrates regional wall motion abnormalities (see scoring diagram/findings for description). Left ventricular  diastolic parameters are consistent with Grade I diastolic dysfunction (impaired relaxation). Elevated left ventricular end-diastolic pressure.  2. Right ventricular systolic function is normal. The right ventricular size is normal. There is normal pulmonary artery systolic pressure.  3. The mitral valve is normal in structure. No evidence of mitral valve regurgitation. No evidence of mitral stenosis.  4. The aortic valve is tricuspid. There is mild calcification of the aortic valve. There is mild thickening of the aortic valve. Aortic valve regurgitation is not visualized. No aortic stenosis is present.  5. The inferior vena cava is normal in size with greater than 50% respiratory variability, suggesting right atrial pressure of 3 mmHg. Comparison(s): A prior study was performed on 07/2022. Changes from prior study are noted. Compared with echo 07/2022, systolic dysfunction is new. FINDINGS  Left Ventricle: Left ventricular ejection fraction, by estimation, is 35 to 40%. The left ventricle has  moderately decreased function. The left ventricle demonstrates regional wall motion abnormalities. The left ventricular internal cavity size was normal in size. There is no left ventricular hypertrophy. Left ventricular diastolic parameters are consistent with Grade I diastolic dysfunction (impaired relaxation). Elevated left ventricular end-diastolic pressure.  LV Wall  Scoring: The entire anterior wall, mid and distal anterior septum, inferior septum, posterior wall, and apex are hypokinetic. The antero-lateral wall, entire inferior wall, basal anteroseptal segment, and apical lateral segment are normal. Right Ventricle: The right ventricular size is normal. No increase in right ventricular wall thickness. Right ventricular systolic function is normal. There is normal pulmonary artery systolic pressure. The tricuspid regurgitant velocity is 1.24 m/s, and  with an assumed right atrial pressure of 3 mmHg, the estimated right ventricular systolic pressure is 9.2 mmHg. Left Atrium: Left atrial size was normal in size. Right Atrium: Right atrial size was normal in size. Pericardium: There is no evidence of pericardial effusion. Mitral Valve: The mitral valve is normal in structure. No evidence of mitral valve regurgitation. No evidence of mitral valve stenosis. Tricuspid Valve: The tricuspid valve is normal in structure. Tricuspid valve regurgitation is trivial. No evidence of tricuspid stenosis. Aortic Valve: The aortic valve is tricuspid. There is mild calcification of the aortic valve. There is mild thickening of the aortic valve. Aortic valve regurgitation is not visualized. No aortic stenosis is present. Pulmonic Valve: The pulmonic valve was normal in structure. Pulmonic valve regurgitation is mild. No evidence of pulmonic stenosis. Aorta: The aortic root is normal in size and structure. Venous: The inferior vena cava is normal in size with greater than 50% respiratory variability, suggesting right atrial pressure of 3 mmHg. IAS/Shunts: No atrial level shunt detected by color flow Doppler.  LEFT VENTRICLE PLAX 2D LVIDd:         5.60 cm   Diastology LVIDs:         4.50 cm   LV e' medial:    0.04 cm/s LV PW:         1.20 cm   LV E/e' medial:  15.8 LV IVS:        1.00 cm   LV e' lateral:   0.07 cm/s LVOT diam:     1.80 cm   LV E/e' lateral: 9.7 LV SV:         44 LV SV Index:   21  LVOT Area:     2.54 cm  RIGHT VENTRICLE             IVC RV S prime:     13.90 cm/s  IVC diam: 1.50 cm TAPSE (M-mode): 2.1 cm LEFT ATRIUM             Index        RIGHT ATRIUM           Index LA diam:        3.70 cm 1.77 cm/m   RA Area:     12.80 cm LA Vol (A2C):   45.0 ml 21.56 ml/m  RA Volume:   29.40 ml  14.09 ml/m LA Vol (A4C):   59.7 ml 28.60 ml/m LA Biplane Vol: 56.0 ml 26.83 ml/m  AORTIC VALVE LVOT Vmax:   104.00 cm/s LVOT Vmean:  68.200 cm/s LVOT VTI:    0.171 m  AORTA Ao Root diam: 2.70 cm MITRAL VALVE               TRICUSPID VALVE MV Area (PHT): 5.62 cm    TR Peak  grad:   6.2 mmHg MV Decel Time: 135 msec    TR Vmax:        124.00 cm/s MV E velocity: 0.69 cm/s MV A velocity: 97.20 cm/s  SHUNTS MV E/A ratio:  0.01        Systemic VTI:  0.17 m                            Systemic Diam: 1.80 cm Annabella Scarce MD Electronically signed by Annabella Scarce MD Signature Date/Time: 11/12/2023/10:26:41 AM    Final    DG Chest Port 1 View Result Date: 11/12/2023 CLINICAL DATA:  [id number].  Follow-up exam. EXAM: PORTABLE CHEST 1 VIEW COMPARISON:  Portable chest yesterday at 7:06 a.m. FINDINGS: 6:03 a.m. There is patchy airspace disease in both lung bases. Overall aeration is unchanged with remaining lungs clear. Minimal pleural effusions. Stable cardiomegaly. No vascular congestion is seen. Stable mediastinum and osseous structures. IMPRESSION: 1. No significant change in patchy airspace disease in both lung bases. 2. Stable cardiomegaly. 3. Minimal pleural effusions. Electronically Signed   By: Francis Quam M.D.   On: 11/12/2023 07:56   CT Angio Chest Pulmonary Embolism (PE) W or WO Contrast Result Date: 11/12/2023 EXAM: CTA of the Chest with contrast for PE 11/12/2023 05:42:36 AM TECHNIQUE: CTA of the chest was performed after the administration of intravenous contrast. Multiplanar reformatted images are provided for review. MIP images are provided for review. Automated exposure control, iterative  reconstruction, and/or weight based adjustment of the mA/kV was utilized to reduce the radiation dose to as low as reasonably achievable. COMPARISON: CT of the chest dated 08/02/2022. CLINICAL HISTORY: Pulmonary embolism (PE) suspected, low to intermediate prob, positive D-dimer. FINDINGS: PULMONARY ARTERIES: Pulmonary arteries are adequately opacified for evaluation. No pulmonary embolism. Main pulmonary artery is normal in caliber. MEDIASTINUM: The heart and pericardium demonstrate no acute abnormality. There is moderate calcific coronary artery disease. There is mild-to-moderate calcific atheromatous disease within the aortic arch. LYMPH NODES: No mediastinal, hilar or axillary lymphadenopathy. LUNGS AND PLEURA: There are dependent ground-glass opacities within the lungs bilaterally. There are streaky peribronchovascular opacities within the lower lobes bilaterally. There is a mild right pleural effusion and a tiny left effusion. UPPER ABDOMEN: The patient is status post cholecystectomy. SOFT TISSUES AND BONES: No acute bone or soft tissue abnormality. IMPRESSION: 1. No evidence of pulmonary embolism. 2. Mild right pleural effusion and tiny left effusion. 3. Peribronchial opacities, compatible with bronchitis/bronchiolitis. Electronically signed by: Evalene Coho MD 11/12/2023 05:52 AM EDT RP Workstation: [id number]   CT HEAD WO CONTRAST ( ) Result Date: 11/11/2023 CLINICAL DATA:  Encephalopathy. EXAM: CT HEAD WITHOUT CONTRAST TECHNIQUE: Contiguous axial images were obtained from the base of the skull through the vertex without intravenous contrast. RADIATION DOSE REDUCTION: This exam was performed according to the departmental dose-optimization program which includes automated exposure control, adjustment of the mA and/or kV according to patient size and/or use of iterative reconstruction technique. COMPARISON:  None Available. FINDINGS: Brain: There is no evidence of an acute infarct, intracranial  hemorrhage, mass, midline shift, or extra-axial fluid collection. There is mild cerebral atrophy. Cerebral white matter hypodensities are nonspecific but compatible with mild chronic small vessel ischemic disease. Vascular: Calcified atherosclerosis at the skull base. No hyperdense vessel. Skull: No acute fracture or suspicious lesion. Sinuses/Orbits: Small volume secretions in the right sphenoid sinus. Small bilateral mastoid effusions. Bilateral cataract extraction. Other: None. IMPRESSION: 1. No evidence of acute intracranial abnormality. 2.  Mild chronic small vessel ischemic disease. Electronically Signed   By: Dasie Hamburg M.D.   On: 11/11/2023 19:05    Scheduled Meds:  aspirin  EC  81 mg Oral Daily   clopidogrel   75 mg Oral Daily   ezetimibe   10 mg Oral Daily   guaiFENesin   600 mg Oral BID   hydrALAZINE   10 mg Oral BID   insulin  aspart  0-20 Units Subcutaneous Q4H   isosorbide  mononitrate  30 mg Oral Daily   levothyroxine   50 mcg Oral Q0600   metoprolol  succinate  25 mg Oral Daily   pantoprazole  (PROTONIX ) IV  40 mg Intravenous QHS   potassium & sodium phosphates   1 packet Oral TID WC & HS   predniSONE   10 mg Oral QHS   rosuvastatin   5 mg Oral QHS   sacubitril -valsartan   1 tablet Oral BID   Continuous Infusions:  heparin  1,450 Units/hr (11/13/23 1517)   levofloxacin  (LEVAQUIN ) IV       LOS: 2 days  MDM: Patient is high risk for one or more organ failure.  They necessitate ongoing hospitalization for continued IV therapies and subsequent lab monitoring. Total time spent interpreting labs and vitals, reviewing the medical record, coordinating care amongst consultants and care team members, directly assessing and discussing care with the patient and/or family: 55 min  Laree Lock, MD [facility]  To contact the attending physician between 7A-7P please use Epic Chat. To contact the covering physician during after hours 7P-7A, please review Amion.  11/13/2023, 4:27 PM   *This  document has been created with the assistance of dictation software. Please excuse typographical errors. *

## 2023-11-13 NOTE — Plan of Care (Signed)

## 2023-11-13 NOTE — Progress Notes (Signed)
 PHARMACY - ANTICOAGULATION CONSULT NOTE  Pharmacy Consult for Heparin  Infusion Indication: chest pain/ACS  Allergies  Allergen Reactions   Statins Other (See Comments)    Aggression  Pt ok to take crestor  20mg  once a week   Hydrocortisone  Nausea Only   Ranexa [Ranolazine] Other (See Comments)    Severe weakness/near syncope after 1 dose Hallucinations    Doxycycline  Diarrhea   Hydrocodone  Nausea And Vomiting   Miralax  [Polyethylene Glycol] Nausea And Vomiting   Z-Pak [Azithromycin ] Other (See Comments)    Raises blood sugar   Zaroxolyn  [Metolazone ] Other (See Comments)    Drained, no energy    Patient Measurements:    Vital Signs: Temp: 98.2 F (36.8 C) (09/07 1906) Temp Source: Oral (09/07 1906) BP: 123/72 (09/07 1906) Pulse Rate: 102 (09/07 2312)  Labs: Recent Labs    11/10/23 1934 11/11/23 0749 11/11/23 2016 11/11/23 2159 11/11/23 2244 11/12/23 0434 11/12/23 0700 11/12/23 1630 11/13/23 0118  HGB 12.8* 12.6*  --   --   --  12.9*  --   --   --   HCT 40.1 40.7  --   --   --  41.0  --   --   --   PLT 268 267  --   --   --  269  --   --   --   HEPARINUNFRC  --   --   --   --   --   --  0.19* 0.57 0.37  CREATININE 1.49* 1.28* 1.38*  --   --  1.33*  --   --   --   CKTOTAL  --   --   --  72  --   --   --   --   --   TROPONINIHS  --   --  1,170*  --  992* 753*  --   --   --     Estimated Creatinine Clearance: 52.7 mL/min (A) (by C-G formula based on SCr of 1.33 mg/dL (H)).   Medical History: Past Medical History:  Diagnosis Date   Adrenal insufficiency (HCC)    Allergy    Anemia    Arthritis    feet    Broken foot 09/2019   had to wore a boot. Left    CKD (chronic kidney disease), stage III (HCC)    Coronary artery disease    has stents   Coronary atherosclerosis of native coronary artery    Proximal LAD, posterior lateral stent widely patent-10/12/11   Diabetes mellitus    insulin  and pills   Hearing loss    wears hearing aids   History of blood  transfusion 06/30/2016   [name] - 2 units transfused   Hyperlipidemia    Hypertension    Hypoxia 11/10/2023   OSA (obstructive sleep apnea)    uses VPAC sleep study 2 years done through [place]. Dr. Jeffrie arranged study   PVD (peripheral vascular disease) (HCC)    Sarcoidosis    Sleep apnea    uses CPAP nightly   Thyroid  disease    Type 2 diabetes mellitus [facility])    Assessment: Patient is a 78yo male admitted from [facility]. Patient with elevated troponin. Pharmacy consulted for Heparin  dosing. No prior anticoagulants noted in history.  9/7 0700 HL 0.19,  Subtherapeutic 9/7 1630 HL 0.57,  Therapeutic x 1 9/8 0118 HL 0.37,  therapeutic x 2  Goal of Therapy:  Heparin  level 0.3-0.7 units/ml Monitor platelets by anticoagulation protocol: Yes  Plan:  Continue heparin  infusion at 1450 units/hr Recheck HL at 1600 to reconfirm, then daily Continue to monitor H&H and platelets daily  Thank you for involving pharmacy in this patient's care.   Rankin CANDIE Dills, PharmD, [facility] 11/13/2023 2:19 AM

## 2023-11-13 NOTE — Progress Notes (Signed)
 Rounding Note   Patient Name: [name] Date of Encounter: 11/13/2023  [facility] Cardiologist: Oneil Parchment, MD  Subjective Patient reports feeling well without chest pain and shortness of breath. He would like to recover from acute illness and have ischemic evaluation as outpatient.   Scheduled Meds:  aspirin  EC  81 mg Oral Daily   clopidogrel   75 mg Oral Daily   ezetimibe   10 mg Oral Daily   guaiFENesin   600 mg Oral BID   hydrALAZINE   10 mg Oral BID   insulin  aspart  0-20 Units Subcutaneous Q4H   insulin  glargine  5 Units Subcutaneous Daily   isosorbide  mononitrate  30 mg Oral Daily   levothyroxine   50 mcg Oral Q0600   metoprolol  succinate  25 mg Oral Daily   pantoprazole  (PROTONIX ) IV  40 mg Intravenous QHS   potassium & sodium phosphates   1 packet Oral TID WC & HS   predniSONE   10 mg Oral QHS   rosuvastatin   5 mg Oral QHS   sacubitril -valsartan   1 tablet Oral BID   Continuous Infusions:  heparin  1,450 Units/hr (11/13/23 0809)   levofloxacin  (LEVAQUIN ) IV     PRN Meds: acetaminophen  **OR** acetaminophen , albuterol , magnesium  hydroxide, montelukast , nitroGLYCERIN , ondansetron  **OR** ondansetron  (ZOFRAN ) IV, traZODone    Vital Signs  Vitals:   11/12/23 1906 11/12/23 2312 11/13/23 0305 11/13/23 0758  BP: 123/72  101/68 120/72  Pulse: 89 (!) 102 92 93  Resp: 17  16 17   Temp: 98.2 F (36.8 C)  (!) 96.3 F (35.7 C) 98.2 F (36.8 C)  TempSrc: Oral  Axillary Oral  SpO2: 93% 94% 100% 93%    Intake/Output Summary (Last 24 hours) at 11/13/2023 1407 Last data filed at 11/13/2023 0900 Gross per 24 hour  Intake 1046.08 ml  Output 1300 ml  Net -253.92 ml      11/10/2023    7:37 PM 11/09/2023    4:48 PM 10/09/2023   11:22 PM  Last 3 Weights  Weight (lbs)  200 lb 200 lb 1.6 oz  Weight (kg)  90.719 kg 90.765 kg     Information is confidential and restricted. Go to Review Flowsheets to unlock data.      Telemetry Sinus rhythm with frequent PACs/PVCs - Personally  Reviewed  Physical Exam  GEN: No acute distress.   Neck: Unable to assess JVD due to body habitus Cardiac: RRR, no murmurs, rubs, or gallops.  Respiratory: Clear to auscultation bilaterally. GI: Soft, nontender, non-distended  MS: No edema; No deformity. Neuro:  Nonfocal  Psych: Normal affect   Labs High Sensitivity Troponin:   Recent Labs  Lab 11/11/23 2016 11/11/23 2244 11/12/23 0434  TROPONINIHS 1,170* 992* 753*     Chemistry Recent Labs  Lab 11/10/23 1934 11/11/23 0749 11/11/23 2016 11/12/23 0434 11/13/23 0321  NA 137   < > 139 139 131*  K 4.0   < > 3.7 3.8 3.5  CL 98   < > 97* 96* 91*  CO2 29   < > 33* 32 31  GLUCOSE 256*   < > 261* 237* 194*  BUN 22   < > 18 16 26*  CREATININE 1.49*   < > 1.38* 1.33* 1.52*  CALCIUM  8.9   < > 8.4* 8.4* 8.1*  MG  --   --  1.7 2.0 2.0  PROT 6.7  --  6.8 7.0  --   ALBUMIN 3.4*  --  3.0* 3.0*  --   AST 28  --  29 28  --   ALT 20  --  18 20  --   ALKPHOS 56  --  66 69  --   BILITOT 1.2  --  1.2 1.4*  --   GFRNONAA 48*   < > 53* 55* 47*  ANIONGAP 10   < > 9 11 9    < > = values in this interval not displayed.    Lipids  Recent Labs  Lab 11/13/23 0321  CHOL 165  TRIG 117  HDL 34*  LDLCALC 108*  CHOLHDL 4.9    Hematology Recent Labs  Lab 11/11/23 0749 11/12/23 0434 11/13/23 0321  WBC 14.3* 14.3* 19.3*  RBC 3.98* 4.00* 3.74*  HGB 12.6* 12.9* 12.2*  HCT 40.7 41.0 37.1*  MCV 102.3* 102.5* 99.2  MCH 31.7 32.3 32.6  MCHC 31.0 31.5 32.9  RDW 13.6 13.2 13.2  PLT 267 269 249   Thyroid   Recent Labs  Lab 11/11/23 0749  TSH 3.163    BNP Recent Labs  Lab 11/11/23 2016  BNP 838.2*    DDimer  Recent Labs  Lab 11/11/23 2016  DDIMER 1.48*     Radiology  DG Chest Port 1 View Result Date: 11/12/2023 IMPRESSION: 1. No significant change in patchy airspace disease in both lung bases. 2. Stable cardiomegaly. 3. Minimal pleural effusions. Electronically Signed   By: Francis Quam M.D.   On: 11/12/2023 07:56   CT  Angio Chest Pulmonary Embolism (PE) W or WO Contrast Result Date: 11/12/2023 MPRESSION: 1. No evidence of pulmonary embolism. 2. Mild right pleural effusion and tiny left effusion. 3. Peribronchial opacities, compatible with bronchitis/bronchiolitis. Electronically signed by: Evalene Coho MD 11/12/2023 05:52 AM EDT RP Workstation: [id number]   CT HEAD WO CONTRAST ( ) Result Date: 11/11/2023 IMPRESSION: 1. No evidence of acute intracranial abnormality. 2. Mild chronic small vessel ischemic disease. Electronically Signed   By: Dasie Hamburg M.D.   On: 11/11/2023 19:05   Cardiac Studies  11/12/2023 Echo complete 1. Left ventricular ejection fraction, by estimation, is 35 to 40%. The  left ventricle has moderately decreased function. The left ventricle  demonstrates regional wall motion abnormalities (see scoring  diagram/findings for description). Left ventricular   diastolic parameters are consistent with Grade I diastolic dysfunction  (impaired relaxation). Elevated left ventricular end-diastolic pressure.   2. Right ventricular systolic function is normal. The right ventricular  size is normal. There is normal pulmonary artery systolic pressure.   3. The mitral valve is normal in structure. No evidence of mitral valve  regurgitation. No evidence of mitral stenosis.   4. The aortic valve is tricuspid. There is mild calcification of the  aortic valve. There is mild thickening of the aortic valve. Aortic valve  regurgitation is not visualized. No aortic stenosis is present.   5. The inferior vena cava is normal in size with greater than 50%  respiratory variability, suggesting right atrial pressure of 3 mmHg.   08/2021 LHC Prox Cx to Mid Cx lesion is 50% stenosed.   Prox LAD lesion is 50% stenosed.   Prox LAD to Mid LAD lesion is 15% stenosed.   1st Diag-1 lesion is 40% stenosed.   1st Diag-2 lesion is 20% stenosed.   RPAV lesion is 15% stenosed.   Prox RCA lesion is 20% stenosed.   Mid  RCA lesion is 20% stenosed.   Patient Profile   78 y.o. male  with a hx of CAD s/p multiple PCIs , HFmrEF, pulmonary sarcoidosis, HNT, PAD, HLD, adrenal  insufficiency on chronic prednisone , chronic hearing loss, anemia with prior GI bleed, CKD stage who is being seen for ongoing management of NSTEMI.   Assessment & Plan   NSTEMI - Patient admitted involuntarily for suicidal ideation. On 9/6 he developed hypoxia and rapid response was called. Suspicion for PNA. Troponin elevated to 1170 and patient was started on IV heparin .  - Chest pain reported - Echo showed EF 35 to 40% with hypokinesis of the anterior wall, mid and distal anterior septum, inferior septum, posterior wall, and apex, G1 DD, elevated LVEDP - Continue IV heparin  for 48 hours - Continue DAPT with ASA and Plavix  for 1 year - Continue Zetia , metoprolol , and rosuvastatin . He reportedly did not tolerate higher dosing of statins.  - Again discussed our recommendation for cardiac catheterization given elevated troponin and reduced EF. Patient and his wife agree that they would like to allow him to recover from acute issues prior to considering ischemic evaluation.   HFrEF - Echo this admission with reduced EF 35-40%, previously 45-50% - Appears euvolemic on exam - Started on Entresto  and metoprolol  this admission, continue to follow renal function - Consider addition of SGLT2i prior to discharge - Will hold off on addition of MRA at this time. Could consider addition if renal function improves.   Suicidal ideation/attempt - Ongoing management per IM and psychiatry  ?Aspiration PNA Pulmonary sarcoidosis - Ongoing management per IM  For questions or updates, please contact [facility] Please consult [email or web address] for contact info under     Signed, Lesley LITTIE Maffucci, PA-C  11/13/2023, 2:07 PM

## 2023-11-13 NOTE — Progress Notes (Signed)
 Patient had a fall when tried to transfer unassisted from bedside commode and lost his balance, no LOC, hit his head and has right eye abrasion.  No changes in mental status, vital signs stable  CT head as patient is on IV heparin  gtt.

## 2023-11-13 NOTE — Progress Notes (Addendum)
 This RN alerted by Physical therapy that the patient had fallen. Upon assessment, patient noted to have a bleeding cut at his right eyebrow. He was alert and oriented to his baseline. He denied any pain. His vital signs were stable. With the assistance of the charge nurse and mobility team, the patient was placed on the bed with a lift. No other injuries noted at this time. Prior to falling, patient was on the bedside commode with the call bell within reach and his wife at bedside. He verbalized attempting to transfer independently when he lost his footing and fell. Patient again reminded to not attempt transfers independently and to call for help. He verbalized understanding. Provider notified, see new orders. Patient left in a locked and low bed, call bell in reach, and bed alarm on. Will continue to monitor.

## 2023-11-13 NOTE — Progress Notes (Signed)
 [place] [facility] Consult Follow-up  Patient Name: .[name]  MRN: [id number]  DOB: Nov 14, 1945  Consult Order details:  Orders (From admission, onward)     Start     Ordered   11/12/23 0928  IP CONSULT TO PSYCHIATRY       Ordering Provider: Jerelene Critchley, MD  Provider:  (Not yet assigned)  Question Answer Comment  Location [facility]   Reason for Consult? Patient here with Suicidal attempt, Kindly follow, Thank you      11/12/23 0928             Mode of Visit: In person    Psychiatry Consult Evaluation  Service Date: November 13, 2023 LOS:  LOS: 2 days  Chief Complaint overdose on pain meds  Primary Psychiatric Diagnoses  Adjustment disorder with emotional disturbance   Assessment  [name] is a 78 y.o. male admitted: MedicallyTroy L [name] is a 77 y.o. male with medical history significant of adrenal insufficiency, chronic kidney disease stage III, heart failure with reduced ejection fraction EF of 45 to 50%, coronary artery disease with lesion to LAD, PAD complex stenting done recently 4 weeks ago, history of beta-blockers insulin -dependent diabetes, chronic hearing loss, hyperlipidemia, hypothyroidism, iron  deficiency anemia, lumbar degenerative disc disease with radiculopathy, chronic eczema, history of Charcot foot of the left, history of diabetic neuropathy obstructive sleep apnea and sarcoidosis, essential hypertension who was admitted  to inpatient psychiatric unit after overdose..  Patient was involuntarily committed after overdose on pain meds for which he took 10 to 60 tablets of tramadol.  Patient was being evaluated and apparently has denied suicidal thoughts.     However patient became hypoxic and Rapid response was called 09/05  when he became hypoxic.  He had 1 episode of vomiting.  Was placed on 2 L of oxygen .  Also started on DuoNeb and Lasix .  Patient was started on empiric antibiotics therapy for possible aspiration pneumonia.   Workup was done including chest x-ray that suspected the pneumonia.  Due to concern for patient's multitude of medical problems he was transferred to acute medical care for management of the suspected aspiration pneumonia. Psychiatry was consulted to follow up on the medical floor.  On assessment patient consistently denies SI/HI/plan and his family members including his wife and son feel very comfortable taking patient home.  They persistently state that patient took the overdose out of frustration regarding chronic pain and they deny it as a suicide attempt including the patient.  Patient remains future oriented and has all vacation plans discussed with the family.  He is willing to follow-up with outpatient mental health services if needed.  Patient is psychiatrically cleared for discharge.  Patient is currently not on any IVC     Diagnoses:  Active Hospital problems: Principal Problem:   Aspiration pneumonia (HCC) Active Problems:   Sarcoidosis   Coronary artery disease with exertional angina (HCC)   GERD   OSA (obstructive sleep apnea)   History of adrenal insufficiency   Pulmonary sarcoidosis (HCC)   Uncontrolled type 2 diabetes mellitus with hyperglycemia, with long-term current use of insulin  (HCC)   Hyperlipidemia   Stage 3a chronic kidney disease (CKD) (HCC)   Suicidal behavior with attempted self-injury (HCC)   Non-ST elevation (NSTEMI) myocardial infarction (HCC)   HFrEF (heart failure with reduced ejection fraction) (HCC)   Pressure injury of skin    Plan   ## Psychiatric Medication Recommendations:  No indication for any psychotropic medications  ##  Medical Decision Making Capacity: Not specifically addressed in this encounter  ## Further Work-up:   No additional work up needed   ## Disposition:-- There are no psychiatric contraindications to discharge at this time  ## Behavioral / Environmental: -Utilize compassion and acknowledge the patient's experiences while  setting clear and realistic expectations for care.    ## Safety and Observation Level:  - Based on my clinical evaluation, I estimate the patient to be at LOW risk of self harm in the current setting. - At this time, we recommend  routine. This decision is based on my review of the chart including patient's history and current presentation, interview of the patient, mental status examination, and consideration of suicide risk including evaluating suicidal ideation, plan, intent, suicidal or self-harm behaviors, risk factors, and protective factors. This judgment is based on our ability to directly address suicide risk, implement suicide prevention strategies, and develop a safety plan while the patient is in the clinical setting. Please contact our team if there is a concern that risk level has changed.  CSSR Risk Category:C-SSRS RISK CATEGORY: High Risk  Suicide Risk Assessment: Patient has following modifiable risk factors for suicide: lack of access to outpatient mental health resources, which we are addressing by recommending outpatient MH follow up  Patient has following non-modifiable or demographic risk factors for suicide: male gender Patient has the following protective factors against suicide: Supportive family  Thank you for this consult request. Recommendations have been communicated to the primary team.  We will sign off at this time.   Rosey Eide, MD       History of Present Illness  Relevant Aspects of [facility]   Patient Report:  On interview patient is awake, alert, oriented x 3.  He made multiple statements that the overdose was not a suicide attempt and that he was frustrated about his chronic pain.  His wife and son had multiple concerns about the whole transfer to [facility] process and the IVC process.  Patient and family was met by this provider and administrative team, nurse manager Medford Doom.  We explained the IVC process and the transportation by [facility].   As patient consistently denied SI/HI/plan and consistently maintains safe behaviors patient and family were informed of psychiatrically clearing him for discharge after medical stabilization.  Son wanted to have a copy of IVC papers and family was informed about risk-management looking into it as IVC papers or legal documents and not part of patient's medical records.  Patient talked extensively about his recent plans for Labor Day weekend to go to [facility] and how he ended up in the hospital.  He talks about different seasons in different places he has been to and wants to be.  Patient's family have no safety concerns at this time and they want to take patient home.  They deny having any access to guns.  Psych ROS:  Depression: Denies Anxiety: Denies Mania (lifetime and current): None reported Psychosis: (lifetime and current): Patient not responding to internal stimuli  Collateral information:  Wife and son were at bedside    Psychiatric and Social History  Psychiatric History:  Information collected from patient and chart  Prev Dx/Sx: None reported Current Psych Provider: None reported Home Meds (current): None reported Previous Med Trials: Unknown Therapy: Unknown  Prior Psych Hospitalization: None reported Prior Self Harm: None reported Prior Violence: Denies  Family Psych History: Denies Family Hx suicide: Denies  Social History:  Lives with family including his wife Access to  weapons/lethal means: Denies  Substance History Alcohol: Social drinking  Tobacco: Denies Illicit drugs: Denies Prescription drug abuse: Denies Rehab hx: Denies  Exam Findings  Physical Exam: Reviewed and agree with the physical exam findings conducted by the medical provider Vital Signs:  Temp:  [96.3 F (35.7 C)-98.2 F (36.8 C)] 97.5 F (36.4 C) (09/08 1956) Pulse Rate:  [47-93] 68 (09/08 1651) Resp:  [16-18] 18 (09/08 1956) BP: (101-120)/(51-78) 117/58 (09/08 1956) SpO2:  [93  %-100 %] 97 % (09/08 1956) Blood pressure (!) 117/58, pulse 68, temperature (!) 97.5 F (36.4 C), temperature source Oral, resp. rate 18, SpO2 97%. There is no height or weight on file to calculate BMI.    Mental Status Exam: General Appearance: Casual  Orientation:  Full (Time, Place, and Person)  Memory:  Immediate;   Fair Recent;   Fair Remote;   Fair  Concentration:  Concentration: Fair and Attention Span: Fair  Recall:  Fair  Attention  Fair  Eye Contact:  Fair  Speech:  Clear and Coherent  Language:  Fair  Volume:  Normal  Mood: fine  Affect:  Congruent  Thought Process:  Coherent  Thought Content:  Logical  Suicidal Thoughts:  No  Homicidal Thoughts:  No  Judgement:  Fair  Insight:  Fair  Psychomotor Activity:  Normal  Akathisia:  No  Fund of Knowledge:  Fair      Assets:  Communication Skills Desire for Improvement  Cognition:  WNL  ADL's:  Intact  AIMS (if indicated):        Other History   These have been pulled in through the EMR, reviewed, and updated if appropriate.  Family History:  The patient's family history includes Anesthesia problems in his sister; Asthma in his sister; Diabetes in his mother; Heart attack in his father; Kidney cancer in his mother; Kidney disease in his mother; Sarcoidosis in his niece and sister.  Medical History: Past Medical History:  Diagnosis Date   Adrenal insufficiency (HCC)    Allergy    Anemia    Arthritis    feet    Broken foot 09/2019   had to wore a boot. Left    CKD (chronic kidney disease), stage III (HCC)    Coronary artery disease    has stents   Coronary atherosclerosis of native coronary artery    Proximal LAD, posterior lateral stent widely patent-10/12/11   Diabetes mellitus    insulin  and pills   Hearing loss    wears hearing aids   History of blood transfusion 06/30/2016   [name] - 2 units transfused   Hyperlipidemia    Hypertension    Hypoxia 11/10/2023   OSA (obstructive sleep apnea)     uses VPAC sleep study 2 years done through [place]. Dr. Jeffrie arranged study   PVD (peripheral vascular disease) (HCC)    Sarcoidosis    Sleep apnea    uses CPAP nightly   Thyroid  disease    Type 2 diabetes mellitus [facility])     Surgical History: Past Surgical History:  Procedure Laterality Date   APPENDECTOMY     CATARACT EXTRACTION  2011   bilat   CHOLECYSTECTOMY  01/25/2011   Procedure: LAPAROSCOPIC CHOLECYSTECTOMY WITH INTRAOPERATIVE CHOLANGIOGRAM;  Surgeon: Redell Alm Faith, DO;  Location: [facility] OR;  Service: General;  Laterality: N/A;   COLONOSCOPY     several   COLONOSCOPY WITH PROPOFOL  N/A 01/03/2020   Procedure: COLONOSCOPY WITH PROPOFOL ;  Surgeon: Avram Lupita BRAVO, MD;  Location: [facility];  Service: Endoscopy;  Laterality: N/A;   CORONARY ANGIOPLASTY     most recent 11/2009   CORONARY PRESSURE/FFR STUDY N/A 01/18/2021   Procedure: INTRAVASCULAR PRESSURE WIRE/FFR STUDY;  Surgeon: Wonda Sharper, MD;  Location: [facility] INVASIVE CV LAB;  Service: Cardiovascular;  Laterality: N/A;   CORONARY STENT INTERVENTION N/A 08/07/2018   Procedure: CORONARY STENT INTERVENTION;  Surgeon: Dann Candyce RAMAN, MD;  Location: [facility] INVASIVE CV LAB;  Service: Cardiovascular;  Laterality: N/A;   CORONARY STENT INTERVENTION N/A 01/18/2021   Procedure: CORONARY STENT INTERVENTION;  Surgeon: Wonda Sharper, MD;  Location: [facility] INVASIVE CV LAB;  Service: Cardiovascular;  Laterality: N/A;   CORONARY STENT PLACEMENT  2009   in LAD and side branch PTCA   ESOPHAGOGASTRODUODENOSCOPY (EGD) WITH PROPOFOL  N/A 01/03/2020   Procedure: ESOPHAGOGASTRODUODENOSCOPY (EGD) WITH PROPOFOL ;  Surgeon: Avram Lupita BRAVO, MD;  Location: [facility] ENDOSCOPY;  Service: Endoscopy;  Laterality: N/A;   HEMOSTASIS CLIP PLACEMENT  01/03/2020   Procedure: HEMOSTASIS CLIP PLACEMENT;  Surgeon: Avram Lupita BRAVO, MD;  Location: [facility] ENDOSCOPY;  Service: Endoscopy;;   INTERCOSTAL NERVE BLOCK  2011, 06/2016   x2. lumbar spine   LEFT HEART  CATHETERIZATION WITH CORONARY ANGIOGRAM Bilateral 10/12/2011   Procedure: LEFT HEART CATHETERIZATION WITH CORONARY ANGIOGRAM;  Surgeon: Oneil Parchment, MD;  Location: [facility] CATH LAB;  Service: Cardiovascular;  Laterality: Bilateral;   LEFT HEART CATHETERIZATION WITH CORONARY ANGIOGRAM N/A 04/01/2014   Procedure: LEFT HEART CATHETERIZATION WITH CORONARY ANGIOGRAM;  Surgeon: Oneil Parchment, MD;  Location: [facility] CATH LAB;  Service: Cardiovascular;  Laterality: N/A;   POLYPECTOMY  01/03/2020   Procedure: POLYPECTOMY;  Surgeon: Avram Lupita BRAVO, MD;  Location: [facility] ENDOSCOPY;  Service: Endoscopy;;   RIGHT/LEFT HEART CATH AND CORONARY ANGIOGRAPHY N/A 08/07/2018   Procedure: RIGHT/LEFT HEART CATH AND CORONARY ANGIOGRAPHY;  Surgeon: Dann Candyce RAMAN, MD;  Location: [facility] INVASIVE CV LAB;  Service: Cardiovascular;  Laterality: N/A;   RIGHT/LEFT HEART CATH AND CORONARY ANGIOGRAPHY N/A 12/25/2020   Procedure: RIGHT/LEFT HEART CATH AND CORONARY ANGIOGRAPHY;  Surgeon: Claudene Victory ORN, MD;  Location: [facility] INVASIVE CV LAB;  Service: Cardiovascular;  Laterality: N/A;   TRANSFORAMINAL LUMBAR INTERBODY FUSION W/ MIS 1 LEVEL N/A 09/24/2021   Procedure: LUMBAR FOUR-FIVE MINIMALLY INVASIVE (MIS) TRANSFORAMINAL LUMBAR INTERBODY FUSION (TLIF) WITH METRX;  Surgeon: Cheryle Debby LABOR, MD;  Location: [facility] OR;  Service: Neurosurgery;  Laterality: N/A;     Medications:   Current Facility-Administered Medications:    acetaminophen  (TYLENOL ) tablet 650 mg, 650 mg, Oral, Q6H PRN **OR** acetaminophen  (TYLENOL ) suppository 650 mg, 650 mg, Rectal, Q6H PRN, Mansy, Jan A, MD   albuterol  (PROVENTIL ) (2.5 MG/3ML) 0.083% nebulizer solution 2.5 mg, 2.5 mg, Nebulization, Q6H PRN, Ponnala, Shruthi, MD, 2.5 mg at 11/13/23 0822   aspirin  EC tablet 81 mg, 81 mg, Oral, Daily, Raford Riggs, MD, 81 mg at 11/13/23 0807   clopidogrel  (PLAVIX ) tablet 75 mg, 75 mg, Oral, Daily, Jesus America, NP, 75 mg at 11/13/23 9183   ezetimibe  (ZETIA ) tablet 10 mg, 10 mg,  Oral, Daily, Jesus America, NP, 10 mg at 11/13/23 9188   guaiFENesin  (MUCINEX ) 12 hr tablet 600 mg, 600 mg, Oral, BID, Mansy, Jan A, MD, 600 mg at 11/13/23 2133   hydrALAZINE  (APRESOLINE ) tablet 10 mg, 10 mg, Oral, BID, Jesus America, NP, 10 mg at 11/13/23 2133   insulin  aspart (novoLOG ) injection 0-20 Units, 0-20 Units, Subcutaneous, Q4H, Morrison, Brenda, NP, 7 Units at 11/13/23 2223   isosorbide  mononitrate (IMDUR ) 24 hr tablet 30 mg, 30 mg, Oral, Daily,  Raford Riggs, MD, 30 mg at 11/13/23 9193   levofloxacin  (LEVAQUIN ) IVPB 500 mg, 500 mg, Intravenous, Q24H, Niels Kayla FALCON, [facility], Last Rate: 100 mL/hr at 11/13/23 2209, 500 mg at 11/13/23 2209   levothyroxine  (SYNTHROID ) tablet 50 mcg, 50 mcg, Oral, Q0600, Jesus America, NP, 50 mcg at 11/13/23 9444   magnesium  hydroxide (MILK OF MAGNESIA) suspension 30 mL, 30 mL, Oral, Daily PRN, Mansy, Jan A, MD   metoprolol  succinate (TOPROL -XL) 24 hr tablet 25 mg, 25 mg, Oral, Daily, Raford Riggs, MD, 25 mg at 11/13/23 9193   montelukast  (SINGULAIR ) tablet 10 mg, 10 mg, Oral, Daily PRN, Ponnala, Shruthi, MD   nitroGLYCERIN  (NITROSTAT ) SL tablet 0.4 mg, 0.4 mg, Sublingual, Q5 min PRN, Jesus America, NP   ondansetron  (ZOFRAN ) tablet 4 mg, 4 mg, Oral, Q6H PRN **OR** ondansetron  (ZOFRAN ) injection 4 mg, 4 mg, Intravenous, Q6H PRN, Mansy, Jan A, MD   pantoprazole  (PROTONIX ) injection 40 mg, 40 mg, Intravenous, QHS, Jesus America, NP, 40 mg at 11/13/23 2133   predniSONE  (DELTASONE ) tablet 10 mg, 10 mg, Oral, QHS, Ponnala, Shruthi, MD, 10 mg at 11/13/23 2133   rosuvastatin  (CRESTOR ) tablet 5 mg, 5 mg, Oral, QHS, Morrison, Brenda, NP, 5 mg at 11/13/23 2133   sacubitril -valsartan  (ENTRESTO ) 24-26 mg per tablet, 1 tablet, Oral, BID, Raford Riggs, MD, 1 tablet at 11/13/23 2133   traZODone  (DESYREL ) tablet 25 mg, 25 mg, Oral, QHS PRN, Mansy, Madison LABOR, MD  Allergies: Allergies  Allergen Reactions   Statins Other (See Comments)     Aggression  Pt ok to take crestor  20mg  once a week   Hydrocortisone  Nausea Only   Ranexa [Ranolazine] Other (See Comments)    Severe weakness/near syncope after 1 dose Hallucinations    Doxycycline  Diarrhea   Hydrocodone  Nausea And Vomiting   Miralax  [Polyethylene Glycol] Nausea And Vomiting   Z-Pak [Azithromycin ] Other (See Comments)    Raises blood sugar   Zaroxolyn  [Metolazone ] Other (See Comments)    Drained, no energy    Allyn Foil, MD

## 2023-11-13 NOTE — Progress Notes (Signed)
 PHARMACY NOTE:  ANTIMICROBIAL RENAL DOSAGE ADJUSTMENT  Current antimicrobial regimen includes a mismatch between antimicrobial dosage and estimated renal function.  As per policy approved by the Pharmacy & Therapeutics and Medical Executive Committees, the antimicrobial dosage will be adjusted accordingly.  Current antimicrobial dosage:  Levofloxacin  750 mg IV q24h  Indication: PNA  Renal Function:  Estimated Creatinine Clearance: 46.1 mL/min (A) (by C-G formula based on SCr of 1.52 mg/dL (H)).    Antimicrobial dosage has been changed to:  Levofloxacin  500 mg IV every 24 hours  Additional comments:   Thank you for allowing pharmacy to be a part of this patient's care.  [name], [facility] 11/13/2023 7:35 AM

## 2023-11-13 NOTE — Evaluation (Signed)
 Physical Therapy Evaluation Patient Details Name: [name] MRN: [id number] DOB: Jul 20, 1945 Today's Date: 11/13/2023  History of Present Illness  Pt is a 78 y.o. male with L foot cellulitis s/p recent debridement with Dr. Gunnar at [facility] who was admitted recently to inpatient psychiatric unit with suicide ideation. He was involuntarily committed after suicide attempt for which he took 10 to 60 tablets of tramadol. Rapid response was called on 9/6 when he became hypoxic and pt was transferred to acute medical care for management of the suspected aspiration pneumonia. MD assessment also includes NSTEMI.  PMH of adrenal insufficiency, CKD3, heart failure with reduced ejection fraction EF of 45 to 50%, CAD with lesion to LAD, PAD complex stenting done recently 4 weeks ago, history of beta-blockers insulin -dependent diabetes, chronic hearing loss, HLD, hypothyroidism, iron  deficiency anemia, lumbar degenerative disc disease with radiculopathy, chronic eczema, history of Charcot foot of the left, history of diabetic neuropathy, HTN, OSA, and sarcoidosis   Clinical Impression  Pt was pleasant and motivated to participate during the session and put forth good effort throughout. Pt education provided on proper sequencing with the RW during SPT from chair to bed to minimize WB through the L foot during the process.  Pt able to unweight himself during the transfer well and was close to being able to complete the transfer with essentially NWB through the L foot with cuing. Pt required min increased time and effort along with the bed rails during sit to sup but no physical assist needed.  Pt reported no adverse symptoms during the session and was left with needs in place and nursing in the room.  Pt will benefit from continued PT services upon discharge to safely address deficits listed in patient problem list for decreased caregiver assistance and eventual return to PLOF.          If plan is discharge  home, recommend the following: A little help with walking and/or transfers;A little help with bathing/dressing/bathroom;Assistance with cooking/housework;Help with stairs or ramp for entrance;Assist for transportation   Can travel by private vehicle        Equipment Recommendations None recommended by PT  Recommendations for Other Services       Functional Status Assessment Patient has had a recent decline in their functional status and demonstrates the ability to make significant improvements in function in a reasonable and predictable amount of time.     Precautions / Restrictions Precautions Precautions: Fall Recall of Precautions/Restrictions: Intact Restrictions Weight Bearing Restrictions Per Provider Order: Yes Other Position/Activity Restrictions: Per orders ok for pt to stand on LLE but walking not recommended; pt stated that podiatry instructed pt to have boot donned to L foot at all times      Mobility  Bed Mobility Overal bed mobility: Modified Independent             General bed mobility comments: Min increased time and effort only    Transfers Overall transfer level: Needs assistance Equipment used: Rolling walker (2 wheels) Transfers: Sit to/from Stand Sit to Stand: Contact guard assist           General transfer comment: Min to mod verbal and visual cues for sequencing with the RW prior to and during SPT from chair to bed to minimize WB through the LLE    Ambulation/Gait               General Gait Details: N/A  Stairs  Wheelchair Mobility     Tilt Bed    Modified Rankin (Stroke Patients Only)       Balance Overall balance assessment: Needs assistance   Sitting balance-Leahy Scale: Normal     Standing balance support: Bilateral upper extremity supported, Reliant on assistive device for balance, During functional activity Standing balance-Leahy Scale: Fair                               Pertinent  Vitals/Pain Pain Assessment Pain Assessment: No/denies pain    Home Living Family/patient expects to be discharged to:: Private residence Living Arrangements: Spouse/significant other Available Help at Discharge: Family;Available 24 hours/day Type of Home: House Home Access: Ramped entrance       Home Layout: One level Home Equipment: Agricultural consultant (2 wheels);Rollator (4 wheels);Wheelchair - manual;Electric scooter;BSC/3in1;Grab bars - tub/shower;Grab bars - toilet;Hand held shower head;Shower seat;Shower seat - built in;Hospital bed;Wheelchair - power;Adaptive equipment;Cane - single point Additional Comments: has car lift for scooter    Prior Function Prior Level of Function : Independent/Modified Independent             Mobility Comments: Ind amb in the home without an AD, uses various AD for community access depending on the distance including SPC, rollator, or electric scooter ADLs Comments: independent; reports that at last podiatry appt he was told to wear boot and to WB through the L foot for standing/transfers only, advised not to ambulate     Extremity/Trunk Assessment   Upper Extremity Assessment Upper Extremity Assessment: Overall WFL for tasks assessed    Lower Extremity Assessment Lower Extremity Assessment: Generalized weakness;LLE deficits/detail LLE Deficits / Details: CAM Boot in place for transfers d/t recent debridement LLE: Unable to fully assess due to immobilization       Communication   Communication Communication: No apparent difficulties    Cognition Arousal: Alert Behavior During Therapy: WFL for tasks assessed/performed   PT - Cognitive impairments: No apparent impairments                         Following commands: Intact       Cueing Cueing Techniques: Verbal cues, Visual cues     General Comments General comments (skin integrity, edema, etc.): sp02 of 90-92% on RA throughout session, HR up to 110    Exercises      Assessment/Plan    PT Assessment Patient needs continued PT services  PT Problem List Decreased strength;Decreased activity tolerance;Decreased balance;Decreased mobility;Decreased knowledge of use of DME;Decreased knowledge of precautions       PT Treatment Interventions DME instruction;Gait training;Functional mobility training;Therapeutic activities;Therapeutic exercise;Balance training;Patient/family education    PT Goals (Current goals can be found in the Care Plan section)  Acute Rehab PT Goals Patient Stated Goal: To get back to walking PT Goal Formulation: With patient Time For Goal Achievement: 11/26/23 Potential to Achieve Goals: Good    Frequency Min 2X/week     Co-evaluation               AM-PAC PT 6 Clicks Mobility  Outcome Measure Help needed turning from your back to your side while in a flat bed without using bedrails?: A Little Help needed moving from lying on your back to sitting on the side of a flat bed without using bedrails?: A Little Help needed moving to and from a bed to a chair (including a wheelchair)?: A Little Help needed standing  up from a chair using your arms (e.g., wheelchair or bedside chair)?: A Little Help needed to walk in hospital room?: Total Help needed climbing 3-5 steps with a railing? : Total 6 Click Score: 14    End of Session Equipment Utilized During Treatment: Gait belt Activity Tolerance: Patient tolerated treatment well Patient left: in bed;with call bell/phone within reach;with bed alarm set;with family/visitor present;with nursing/sitter in room Nurse Communication: Mobility status;Weight bearing status PT Visit Diagnosis: Unsteadiness on feet (R26.81);Difficulty in walking, not elsewhere classified (R26.2);Muscle weakness (generalized) (M62.81)    Time: 8996-8972 PT Time Calculation (min) (ACUTE ONLY): 24 min   Charges:   PT Evaluation $PT Eval Moderate Complexity: 1 Mod PT Treatments $Therapeutic Activity:  8-22 mins PT General Charges $$ ACUTE PT VISIT: 1 Visit    D. Scott Yolani Vo PT, DPT 11/13/23, 10:49 AM

## 2023-11-13 NOTE — Care Management Important Message (Signed)
 Important Message  Patient Details  Name: [name] MRN: [id number] Date of Birth: Jul 09, 1945   Important Message Given:  Yes - Medicare IM     Rojelio SHAUNNA Rattler 11/13/2023, 3:02 PM

## 2023-11-13 NOTE — Progress Notes (Signed)
 Per provider, okay to discontinue 1:1 sitter at this time. Charge RN made aware.

## 2023-11-13 NOTE — Evaluation (Signed)
 Occupational Therapy Evaluation Patient Details Name: KAMSIYOCHUKWU SPICKLER MRN: [id number] DOB: 1945/11/25 Today's Date: 11/13/2023   History of Present Illness   Pt is a 78 y.o. male who was admitted 4 days ago to inpatient psychiatric unit with suicide ideation. He was involuntarily committed after suicide attempt for which he took 10 to 60 tablets of tramadol. Rapid response was called on 9/6 when he became hypoxic and pt was transferred to acute medical care for management of the suspected aspiration pneumonia. PMH of adrenal insufficiency, CKD3, heart failure with reduced ejection fraction EF of 45 to 50%, CAD with lesion to LAD, PAD complex stenting done recently 4 weeks ago, history of beta-blockers insulin -dependent diabetes, chronic hearing loss, HLD, hypothyroidism, iron  deficiency anemia, lumbar degenerative disc disease with radiculopathy, chronic eczema, history of Charcot foot of the left, history of diabetic neuropathy, HTN, OSA, and sarcoidosis     Clinical Impressions Pt was seen for OT evaluation this date. PTA, pt resides in a one level home with ramped entrance with his wife. He was all necessary DME and is MOD I/IND with intermittent use of AD as needed for back pain, L foot wounds. He has a Public house manager. MOD I/IND with ADL performance. Pt presents with deficits in strength, balance, activity tolerance and pain management, affecting safe and optimal ADL completion. Pt currently requires MOD I for bed mobility. He demo LB dressing with set up seated at EOB to don R shoe. He demo STS from EOB to RW with cues for hand placement and Min A to steady to prevent posterior LOB. Pt required Min A for stand pivot to recliner using RW with cues for reliance on UE support and RLE to offload weight from LLE. Pt/wife report at last podiatry appt he was cleared for standing/pivots in the boot with no walking.  Pt would benefit from skilled OT services to address noted impairments and functional  limitations to maximize safety and independence while minimizing future risk of falls, injury, and readmission. Do anticipate the need for follow up OT services upon acute hospital DC.      If plan is discharge home, recommend the following:   A little help with walking and/or transfers;A little help with bathing/dressing/bathroom;Assistance with cooking/housework;Assist for transportation;Help with stairs or ramp for entrance     Functional Status Assessment   Patient has had a recent decline in their functional status and demonstrates the ability to make significant improvements in function in a reasonable and predictable amount of time.     Equipment Recommendations   None recommended by OT     Recommendations for Other Services         Precautions/Restrictions   Precautions Precautions: Fall Recall of Precautions/Restrictions: Intact Restrictions Weight Bearing Restrictions Per Provider Order: Yes Other Position/Activity Restrictions: MD here recommended NWB, however per last podiatry note on 9/3 with [facility] pt is WBAT in L cam boot for standing/transfers only and no walking recommended     Mobility Bed Mobility Overal bed mobility: Modified Independent             General bed mobility comments: increased time/effort with HOB low, no physical assist needed    Transfers Overall transfer level: Needs assistance Equipment used: Rolling walker (2 wheels) Transfers: Sit to/from Stand Sit to Stand: Min assist           General transfer comment: Min A for steadying and cues for hand placement to stand from EOB to RW then able to SPT to  recliner using RW with Min/CGA      Balance Overall balance assessment: Needs assistance Sitting-balance support: Feet supported, Single extremity supported Sitting balance-Leahy Scale: Good     Standing balance support: Bilateral upper extremity supported, Reliant on assistive device for balance Standing  balance-Leahy Scale: Fair Standing balance comment: heavy UE support on RW to offload weight from L foot; CGA external support                           ADL either performed or assessed with clinical judgement   ADL Overall ADL's : Needs assistance/impaired                 Upper Body Dressing : Supervision/safety;Bed level Upper Body Dressing Details (indicate cue type and reason): change out gowns Lower Body Dressing: Set up;Sitting/lateral leans Lower Body Dressing Details (indicate cue type and reason): to don R shoe Toilet Transfer: Stand-pivot;Rolling walker (2 wheels);Contact guard assist;Minimal assistance Toilet Transfer Details (indicate cue type and reason): simulated to recliner via SPT                 Vision         Perception         Praxis         Pertinent Vitals/Pain Pain Assessment Pain Assessment: Faces Faces Pain Scale: Hurts a little bit Pain Location: L foot Pain Descriptors / Indicators: Sore Pain Intervention(s): Monitored during session, Repositioned     Extremity/Trunk Assessment Upper Extremity Assessment Upper Extremity Assessment: Overall WFL for tasks assessed   Lower Extremity Assessment Lower Extremity Assessment: Generalized weakness;LLE deficits/detail LLE Deficits / Details: CAM Boot in place for transfers d/t recent debridement       Communication Communication Communication: No apparent difficulties   Cognition Arousal: Alert Behavior During Therapy: WFL for tasks assessed/performed Cognition: No apparent impairments                               Following commands: Intact       Cueing  General Comments   Cueing Techniques: Verbal cues  sp02 of 90-92% on RA throughout session, HR up to 110   Exercises Other Exercises Other Exercises: Edu on role of OT in acute setting, DC recommendations.   Shoulder Instructions      Home Living Family/patient expects to be discharged to::  Private residence Living Arrangements: Spouse/significant other Available Help at Discharge: Family;Available 24 hours/day Type of Home: House Home Access: Ramped entrance     Home Layout: One level     Bathroom Shower/Tub: Producer, television/film/video: Handicapped height Bathroom Accessibility: Yes   Home Equipment: Agricultural consultant (2 wheels);Rollator (4 wheels);Wheelchair - manual;Electric scooter;BSC/3in1;Grab bars - tub/shower;Grab bars - toilet;Hand held shower head;Shower seat;Shower seat - built in;Hospital bed;Wheelchair - power;Adaptive equipment;Cane - single point Adaptive Equipment: Reacher;Sock aid;Long-handled sponge        Prior Functioning/Environment Prior Level of Function : Independent/Modified Independent             Mobility Comments: no AD inside home, uses rollator/scooter for community mobility, RW occassionally ADLs Comments: independent; reports that at last podiatry appt he is to wear boot for standing/transfers only    OT Problem List: Decreased strength;Decreased activity tolerance;Impaired balance (sitting and/or standing);Pain   OT Treatment/Interventions: Self-care/ADL training;Therapeutic exercise;Patient/family education;Balance training;Therapeutic activities;DME and/or AE instruction      OT Goals(Current goals can be  found in the care plan section)   Acute Rehab OT Goals Patient Stated Goal: return home OT Goal Formulation: With patient Time For Goal Achievement: 11/27/23 Potential to Achieve Goals: Good ADL Goals Pt Will Perform Lower Body Bathing: with modified independence;sitting/lateral leans;sit to/from stand Pt Will Perform Lower Body Dressing: with modified independence;sit to/from stand;sitting/lateral leans Pt Will Transfer to Toilet: with modified independence;stand pivot transfer   OT Frequency:  Min 2X/week    Co-evaluation              AM-PAC OT 6 Clicks Daily Activity     Outcome Measure Help from  another person eating meals?: None Help from another person taking care of personal grooming?: None Help from another person toileting, which includes using toliet, bedpan, or urinal?: A Little Help from another person bathing (including washing, rinsing, drying)?: A Little Help from another person to put on and taking off regular upper body clothing?: None Help from another person to put on and taking off regular lower body clothing?: A Little 6 Click Score: 21   End of Session Equipment Utilized During Treatment: Gait belt;Rolling walker (2 wheels) Nurse Communication: Mobility status  Activity Tolerance: Patient tolerated treatment well Patient left: in chair;with call bell/phone within reach;with chair alarm set;with family/visitor present;with nursing/sitter in room  OT Visit Diagnosis: Other abnormalities of gait and mobility (R26.89);Muscle weakness (generalized) (M62.81)                Time: 9199-9180 OT Time Calculation (min): 19 min Charges:  OT General Charges $OT Visit: 1 Visit OT Evaluation $OT Eval Low Complexity: 1 Low Coron Rossano, OTR/L 11/13/23, 9:14 AM  Hesston Hitchens E Levent Kornegay 11/13/2023, 9:10 AM

## 2023-11-13 NOTE — Telephone Encounter (Signed)
 Patient Product/process development scientist completed.    The patient is insured through [employer]. Patient has Medicare and is not eligible for a copay card, but may be able to apply for patient assistance or Medicare RX Payment Plan (Patient Must reach out to their plan, if eligible for payment plan), if available.    Ran test claim for xacubitril-valsartan  24-26 mg and the current 30 day co-pay is $47.00.  Ran test claim for Farxiga 10 mg and the current 30 day co-pay is $253.82.  Ran test claim for Jardiance 10 mg and the current 30 day co-pay is $47.00.  This test claim was processed through [facility]- copay amounts may vary at other pharmacies due to pharmacy/plan contracts, or as the patient moves through the different stages of their insurance plan.     Reyes Sharps, CPHT Pharmacy Technician III Certified Patient Advocate [facility] Patient Advocate Team Direct Number: [phone]  Fax: [phone]

## 2023-11-14 DIAGNOSIS — I25118 Atherosclerotic heart disease of native coronary artery with other forms of angina pectoris: Secondary | ICD-10-CM | POA: Diagnosis not present

## 2023-11-14 DIAGNOSIS — I429 Cardiomyopathy, unspecified: Secondary | ICD-10-CM | POA: Diagnosis not present

## 2023-11-14 DIAGNOSIS — J690 Pneumonitis due to inhalation of food and vomit: Secondary | ICD-10-CM | POA: Diagnosis not present

## 2023-11-14 DIAGNOSIS — R7989 Other specified abnormal findings of blood chemistry: Secondary | ICD-10-CM | POA: Diagnosis not present

## 2023-11-14 DIAGNOSIS — I214 Non-ST elevation (NSTEMI) myocardial infarction: Secondary | ICD-10-CM | POA: Diagnosis not present

## 2023-11-14 LAB — CBC
HCT: 35 % — ABNORMAL LOW (ref 39.0–52.0)
Hemoglobin: 11.9 g/dL — ABNORMAL LOW (ref 13.0–17.0)
MCH: 32.9 pg (ref 26.0–34.0)
MCHC: 34 g/dL (ref 30.0–36.0)
MCV: 96.7 fL (ref 80.0–100.0)
Platelets: 245 K/uL (ref 150–400)
RBC: 3.62 MIL/uL — ABNORMAL LOW (ref 4.22–5.81)
RDW: 13.2 % (ref 11.5–15.5)
WBC: 12.5 K/uL — ABNORMAL HIGH (ref 4.0–10.5)
nRBC: 0 % (ref 0.0–0.2)

## 2023-11-14 LAB — GLUCOSE, CAPILLARY
Glucose-Capillary: 162 mg/dL — ABNORMAL HIGH (ref 70–99)
Glucose-Capillary: 195 mg/dL — ABNORMAL HIGH (ref 70–99)
Glucose-Capillary: 209 mg/dL — ABNORMAL HIGH (ref 70–99)
Glucose-Capillary: 225 mg/dL — ABNORMAL HIGH (ref 70–99)
Glucose-Capillary: 225 mg/dL — ABNORMAL HIGH (ref 70–99)
Glucose-Capillary: 244 mg/dL — ABNORMAL HIGH (ref 70–99)
Glucose-Capillary: 297 mg/dL — ABNORMAL HIGH (ref 70–99)
Glucose-Capillary: 309 mg/dL — ABNORMAL HIGH (ref 70–99)

## 2023-11-14 LAB — MAGNESIUM: Magnesium: 1.9 mg/dL (ref 1.7–2.4)

## 2023-11-14 LAB — BASIC METABOLIC PANEL WITH GFR
Anion gap: 10 (ref 5–15)
BUN: 31 mg/dL — ABNORMAL HIGH (ref 8–23)
CO2: 30 mmol/L (ref 22–32)
Calcium: 7.7 mg/dL — ABNORMAL LOW (ref 8.9–10.3)
Chloride: 96 mmol/L — ABNORMAL LOW (ref 98–111)
Creatinine, Ser: 1.41 mg/dL — ABNORMAL HIGH (ref 0.61–1.24)
GFR, Estimated: 51 mL/min — ABNORMAL LOW (ref >60)
Glucose, Bld: 147 mg/dL — ABNORMAL HIGH (ref 70–99)
Potassium: 3.2 mmol/L — ABNORMAL LOW (ref 3.5–5.1)
Sodium: 136 mmol/L (ref 135–145)

## 2023-11-14 LAB — LEGIONELLA PNEUMOPHILA SEROGP 1 UR AG: L. pneumophila Serogp 1 Ur Ag: NEGATIVE

## 2023-11-14 LAB — PHOSPHORUS: Phosphorus: 2.8 mg/dL (ref 2.5–4.6)

## 2023-11-14 MED ORDER — INSULIN ASPART PROT & ASPART (70-30 MIX) 100 UNIT/ML ~~LOC~~ SUSP
20.0000 [IU] | Freq: Two times a day (BID) | SUBCUTANEOUS | Status: DC
  Administered 2023-11-14 (×2): 20 [IU] via SUBCUTANEOUS
  Filled 2023-11-14: qty 10

## 2023-11-14 MED ORDER — SODIUM CHLORIDE 0.9 % IV SOLN
12.5000 mg | Freq: Four times a day (QID) | INTRAVENOUS | Status: DC | PRN
  Administered 2023-11-14: 12.5 mg via INTRAVENOUS
  Filled 2023-11-14: qty 12.5

## 2023-11-14 MED ORDER — POTASSIUM CHLORIDE 20 MEQ PO PACK
40.0000 meq | PACK | Freq: Once | ORAL | Status: AC
  Administered 2023-11-14: 40 meq via ORAL
  Filled 2023-11-14: qty 2

## 2023-11-14 MED ORDER — MAGNESIUM SULFATE IN D5W 1-5 GM/100ML-% IV SOLN
1.0000 g | Freq: Once | INTRAVENOUS | Status: AC
  Administered 2023-11-14: 1 g via INTRAVENOUS
  Filled 2023-11-14: qty 100

## 2023-11-14 MED ORDER — POTASSIUM CHLORIDE 20 MEQ PO PACK: 40.0000 meq | PACK | Freq: Once | ORAL | Status: DC

## 2023-11-14 NOTE — TOC Initial Note (Signed)
 Transition of Care [facility]) - Initial/Assessment Note    Patient Details  Name: [name] MRN: [id number] Date of Birth: 10-02-1945  [facility] CM/SW Contact:    Corean ONEIDA Haddock, RN Phone Number: 11/14/2023, 11:01 AM  Clinical Narrative:                  Admitted for: Suicidal attempt vs Unintentional overdose Acute toxic encephalopathy due to tramadol overdose Admitted from: home with wife PCP: [facility]  Current home health/prior home health/DME:Home Equipment: Agricultural consultant (2 wheels);Rollator (4 wheels);Wheelchair - manual;Electric scooter;BSC/3in1;Grab bars - tub/shower;Grab bars - toilet;Hand held shower head;Shower seat;Shower seat - built in;Hospital bed;Wheelchair - power;Adaptive equipment;Cane - single point Additional Comments: has car lift for scooter   Therapy recommending HH Patient has been cleared by psych Met with patient and wife at bedside.  Patient states that he is already active with [facility].  Confirmed with [place]  at [facility] that patient is active with PT and OT.  Will need resumption orders.  MD notified         Patient Goals and CMS Choice            Expected Discharge Plan and Services                                              Prior Living Arrangements/Services                       Activities of Daily Living      Permission Sought/Granted                  Emotional Assessment              Admission diagnosis:  Suicidal ideation [R45.851] Encephalopathy [G93.40] Aspiration pneumonia (HCC) [J69.0] Patient Active Problem List   Diagnosis Date Noted   Non-ST elevation (NSTEMI) myocardial infarction (HCC) 11/12/2023   HFrEF (heart failure with reduced ejection fraction) (HCC) 11/12/2023   Pressure injury of skin 11/12/2023   Suicidal behavior with attempted self-injury (HCC) 11/11/2023   Aspiration pneumonia (HCC) 11/11/2023   Adjustment disorder with depressed mood 11/10/2023    Hypoxia 11/10/2023   Aspiration into airway 11/10/2023   Ulcer of left midfoot with necrosis of bone (HCC) 10/10/2023   Hyponatremia 10/09/2023   Cellulitis 10/09/2023   Impacted cerumen of both ears 07/09/2023   Chronic eczematous otitis externa of left ear 07/09/2023   Sensorineural hearing loss, bilateral 07/09/2023   Multifocal pneumonia 08/02/2022   Severe sepsis (HCC) 03/16/2022   Right upper lobe pneumonia 03/16/2022   Cellulitis of left lower extremity 03/15/2022   HTN (hypertension) 10/20/2021   Chest pain of uncertain etiology    Frequent PVCs    Acute renal failure superimposed on stage 3a chronic kidney disease (HCC) 10/19/2021   Lobar pneumonia (HCC) 10/07/2021   CAP (community acquired pneumonia) 10/01/2021   Back pain 09/23/2021   Herniated lumbar intervertebral disc 09/23/2021   Lumbar radiculopathy, acute 09/14/2021   Azotemia 09/14/2021   Intractable low back pain 09/13/2021   Acute encephalopathy 09/13/2021   Bandemia 09/13/2021   Acute respiratory failure with hypoxia (HCC) 09/13/2021   DOE (dyspnea on exertion) 12/25/2020   Gastric polyps    GI bleed 01/01/2020   Osteomyelitis (HCC) 01/01/2020   Charcot's joint, left ankle and foot 11/06/2019   Peripheral arterial  disease (HCC) 07/12/2019   Pain in right elbow 10/30/2018   AKI (acute kidney injury) (HCC) 10/03/2018   Hypoalbuminemia 10/03/2018   Hypokalemia 10/03/2018   Hypothyroidism 10/03/2018   Stage 3a chronic kidney disease (CKD) (HCC)    Other iron  deficiency anemias    Normocytic anemia 06/29/2016   Chronic diastolic heart failure (HCC) 01/14/2014   Stable angina (HCC) 01/14/2014   Obesity 01/14/2014   Pulmonary sarcoidosis (HCC) 01/14/2014   Uncontrolled type 2 diabetes mellitus with hyperglycemia, with long-term current use of insulin  (HCC) 01/14/2014   Hyperlipidemia 01/14/2014   Coronary atherosclerosis of native coronary artery    Sinusitis, chronic 05/30/2011   OSA (obstructive sleep  apnea) 10/09/2008   History of adrenal insufficiency 02/21/2008   Coronary artery disease with exertional angina (HCC) 11/20/2007   Sarcoidosis 02/20/2007   GERD 12/20/2006   PCP:  Arloa Elsie SAUNDERS, MD Pharmacy:   [id number] - [place], [place] - [address] [place] [address] Phone: [phone] Fax: [phone]  [facility] - [address] [place] - [address] [place] [address] Phone: [phone] Fax: [phone]  [facility] [address] [place] [address] Phone: [phone] Fax: [phone]     Social Drivers of Health (SDOH) Social History: SDOH Screenings   Food Insecurity: No Food Insecurity (11/12/2023)  Housing: Low Risk  (11/12/2023)  Transportation Needs: No Transportation Needs (11/12/2023)  Utilities: Not At Risk (11/12/2023)  Alcohol Screen: Low Risk  (11/10/2023)  Financial Resource Strain: Low Risk  (02/08/2020)   Received from [facility]  Social Connections: Moderately Integrated (11/12/2023)  Stress: No Stress Concern Present (10/15/2023)   Received from [facility]  Tobacco Use: Low Risk  (11/10/2023)   SDOH Interventions:     Readmission Risk Interventions    03/18/2022    9:07 AM  Readmission Risk Prevention Plan  Transportation Screening Complete  Medication Review (RN Care Manager) Complete  PCP or Specialist appointment within 3-5 days of discharge Complete  HRI or Home Care Consult Complete  SW Recovery Care/Counseling Consult Complete  Palliative Care Screening Complete  Skilled Nursing Facility Complete

## 2023-11-14 NOTE — Plan of Care (Signed)

## 2023-11-14 NOTE — Progress Notes (Signed)
 Physical Therapy Treatment Patient Details Name: [name] MRN: [id number] DOB: November 30, 1945 Today's Date: 11/14/2023   History of Present Illness Pt is a 78 y.o. male with L foot cellulitis s/p recent debridement with Dr. Gunnar at [facility] who was admitted recently to inpatient psychiatric unit with suicide ideation. He was involuntarily committed after suicide attempt for which he took 10 to 60 tablets of tramadol. Rapid response was called on 9/6 when he became hypoxic and pt was transferred to acute medical care for management of the suspected aspiration pneumonia. MD assessment also includes NSTEMI.  PMH of adrenal insufficiency, CKD3, heart failure with reduced ejection fraction EF of 45 to 50%, CAD with lesion to LAD, PAD complex stenting done recently 4 weeks ago, history of beta-blockers insulin -dependent diabetes, chronic hearing loss, HLD, hypothyroidism, iron  deficiency anemia, lumbar degenerative disc disease with radiculopathy, chronic eczema, history of Charcot foot of the left, history of diabetic neuropathy, HTN, OSA, and sarcoidosis    PT Comments  Pt was pleasant and motivated to participate during the session and put forth good effort throughout. Pt required no physical assistance with bed mobility tasks or to come to standing using a trial of a sara stedy lift.  Pt given verbal and visual cues on proper use of sara stedy and to focus on limiting WB through LLE as able.  Pt put forth good effort with below therex with manual resistance provided as able to pt's tolerance.  Pt will benefit from continued PT services upon discharge to safely address deficits listed in patient problem list for decreased caregiver assistance and eventual return to PLOF.      If plan is discharge home, recommend the following: A little help with walking and/or transfers;A little help with bathing/dressing/bathroom;Assistance with cooking/housework;Help with stairs or ramp for entrance;Assist for  transportation   Can travel by private vehicle        Equipment Recommendations  None recommended by PT    Recommendations for Other Services       Precautions / Restrictions Precautions Precautions: Fall Recall of Precautions/Restrictions: Intact Restrictions Weight Bearing Restrictions Per Provider Order: Yes Other Position/Activity Restrictions: Per orders ok for pt to stand on LLE but walking not recommended; pt stated that podiatry instructed pt to have boot donned to L foot at all times     Mobility  Bed Mobility Overal bed mobility: Modified Independent             General bed mobility comments: Min increased time and effort only    Transfers Overall transfer level: Needs assistance   Transfers: Sit to/from Stand Sit to Stand: From elevated surface, Via lift equipment           General transfer comment: Pt able to come to standing using sara stedy lift with good eccentric and concentric control and stability Transfer via Lift Equipment: Stedy  Ambulation/Gait               General Gait Details: N/A   Stairs             Wheelchair Mobility     Tilt Bed    Modified Rankin (Stroke Patients Only)       Balance                                            Communication Communication Communication: No apparent difficulties Factors  Affecting Communication: Hearing impaired  Cognition Arousal: Alert Behavior During Therapy: WFL for tasks assessed/performed   PT - Cognitive impairments: No apparent impairments                         Following commands: Intact      Cueing Cueing Techniques: Verbal cues, Visual cues  Exercises Total Joint Exercises Towel Squeeze: Strengthening, Both, 10 reps, 15 reps Long Arc Quad: Strengthening, Left, 10 reps, 15 reps (with manual resistance) Knee Flexion: Strengthening, Left, 10 reps, 15 reps (with manual resistance) Marching in Standing: Strengthening, Left, 10  reps, Seated, 15 reps (with manual resistance)    General Comments        Pertinent Vitals/Pain Pain Assessment Pain Assessment: 0-10 Pain Score: 5  Pain Location: back Pain Descriptors / Indicators: Sore Pain Intervention(s): Repositioned, Monitored during session    Home Living                          Prior Function            PT Goals (current goals can now be found in the care plan section) Progress towards PT goals: Progressing toward goals    Frequency    Min 2X/week      PT Plan      Co-evaluation              AM-PAC PT 6 Clicks Mobility   Outcome Measure  Help needed turning from your back to your side while in a flat bed without using bedrails?: A Little Help needed moving from lying on your back to sitting on the side of a flat bed without using bedrails?: A Little Help needed moving to and from a bed to a chair (including a wheelchair)?: A Little Help needed standing up from a chair using your arms (e.g., wheelchair or bedside chair)?: A Little Help needed to walk in hospital room?: Total Help needed climbing 3-5 steps with a railing? : Total 6 Click Score: 14    End of Session Equipment Utilized During Treatment: Gait belt Activity Tolerance: Patient tolerated treatment well Patient left: in chair;with call bell/phone within reach;with chair alarm set Nurse Communication: Mobility status;Weight bearing status PT Visit Diagnosis: Unsteadiness on feet (R26.81);Difficulty in walking, not elsewhere classified (R26.2);Muscle weakness (generalized) (M62.81)     Time: 8865-8841 PT Time Calculation (min) (ACUTE ONLY): 24 min  Charges:    $Therapeutic Exercise: 8-22 mins $Therapeutic Activity: 8-22 mins PT General Charges $$ ACUTE PT VISIT: 1 Visit                    D. Scott Trexton Escamilla PT, DPT 11/14/23, 12:15 PM

## 2023-11-14 NOTE — Progress Notes (Signed)
 Heart Failure Stewardship Pharmacy Note  PCP: Arloa Elsie SAUNDERS, MD PCP-Cardiologist: Oneil Parchment, MD  HPI: [name] is a 78 y.o. male with adrenal insufficiency, chronic kidney disease stage III, heart failure with reduced ejection fraction, coronary artery disease, PAD, insulin -dependent diabetes, chronic hearing loss, hyperlipidemia, hypothyroidism, iron  deficiency anemia, lumbar degenerative disc disease with radiculopathy, chronic eczema, history of Charcot foot of the left, history of diabetic neuropathy, obstructive sleep apnea and sarcoidosis, essential hypertension who presented with suicidal ideation. On admission, BNP was 838.2, AM cortisol 13.7, UDS negative, lactate 1.4, HS-troponin was 1170 > 992 > 753, and TSH 3.163. On 11/10/23, patient became hypoxic and experienced vomiting. Chest x-ray noted bilateral lower lobe airspace opacities, right greater than left concerning for pneumonia suspected to be aspiration PNA.   Pertinent cardiac history: CAD with DES to LAD and PLA in 2011, DESx2 right posterior dedscending artery in 08/2018, DES to pRCA mRCA and 1st diag in 01/2021. Most recent LHC 08/2021 at [facility] showed nonobstructive CAD and no interventions were performed. TTE 11/2023 with newly reduced LVEF to 35-40% with G1DD.   Pertinent Lab Values: Creat  Date Value Ref Range Status  01/09/2020 2.17 (H) 0.70 - 1.18 mg/dL Final    Comment:    For patients >57 years of age, the reference limit for Creatinine is approximately 13% higher for people identified as African-American. .    Creatinine, Ser  Date Value Ref Range Status  11/14/2023 1.41 (H) 0.61 - 1.24 mg/dL Final   BUN  Date Value Ref Range Status  11/14/2023 31 (H) 8 - 23 mg/dL Final  89/75/7976 19 8 - 27 mg/dL Final   Potassium  Date Value Ref Range Status  11/14/2023 3.2 (L) 3.5 - 5.1 mmol/L Final   Sodium  Date Value Ref Range Status  11/14/2023 136 135 - 145 mmol/L Final  12/28/2021 142 134 - 144 mmol/L  Final   B Natriuretic Peptide  Date Value Ref Range Status  11/11/2023 838.2 (H) 0.0 - 100.0 pg/mL Final    Comment:    Performed at [facility], [address] [place], [place] [address]   Magnesium   Date Value Ref Range Status  11/14/2023 1.9 1.7 - 2.4 mg/dL Final    Comment:    Performed at [facility], [address] [place], [place] [address]   Hgb A1c MFr Bld  Date Value Ref Range Status  10/09/2023 10.6 (H) 4.8 - 5.6 % Final    Comment:    (NOTE)         Prediabetes: 5.7 - 6.4         Diabetes: >6.4         Glycemic control for adults with diabetes: <7.0    TSH  Date Value Ref Range Status  11/11/2023 3.163 0.350 - 4.500 uIU/mL Final    Comment:    Performed by a 3rd Generation assay with a functional sensitivity of <=0.01 uIU/mL. Performed at [facility], [address] [place], [place] [address]   07/27/2018 6.200 (H) 0.450 - 4.500 uIU/mL Final   LDH  Date Value Ref Range Status  11/13/2019 181 98 - 192 U/L Final    Comment:    Performed at [facility], [address], [place], [place] [address]    Vital Signs:  Temp:  [97.5 F (36.4 C)-97.8 F (36.6 C)] 97.8 F (36.6 C) (09/09 0858) Pulse Rate:  [47-68] 58 (09/09 0858) Cardiac Rhythm: Normal sinus rhythm (09/09  9296) Resp:  [16-18] 18 (09/09 0214) BP: (113-132)/(51-78) 132/66 (09/09 0858) SpO2:  [95 %-97 %] 95 % (09/09 0858)  Intake/Output Summary (Last 24 hours) at 11/14/2023 1043 Last data filed at 11/14/2023 9380 Gross per 24 hour  Intake 369.37 ml  Output 400 ml  Net -30.63 ml   Current Heart Failure Medications:  Loop diuretic: none Beta-Blocker: metoprolol  succinate 25 mg daily ACEI/ARB/ARNI: Entresto  24-26 mg BID MRA: none SGLT2i: none Other: hydralazine  10 mg BID  Prior to admission Heart Failure Medications:  Loop diuretic: furosemide  80 mg prn Beta-Blocker: none ACEI/ARB/ARNI: losartan  100 mg daily MRA: none SGLT2i:  none Other: hydralazine  10 mg BID, amlodipine   Assessment: 1. Acute combined systolic and diastolic heart failure (LVEF 35-40%) with G1DD, due to ICM. NYHA class II-III symptoms.  -Symptoms: Reports feeling well overall. Denies any symptoms. Fell and hit head yesterday, but CT showed no acute abnormalities. -Volume: Appears to be euvolemic. No indication for loop diuretics at this time.  -Hemodynamics: BP stable and HR ~90.  -BB: Continue metoprolol  succinate 25 mg daily. -ACEI/ARB/ARNI: Continue Entresto  24-26 mg BID. -MRA: Can consider adding when creatinine is trending down if BP stable. -SGLT2i: WBC trending down after steroid decrease. Would consider adding Jardiance as Jardiance is significantly cheaper for this patient than [place]. -PTA amlodipine  stopped. Would not resume at discharge.  Plan: 1) Medication changes recommended at this time: - Consider starting Jardiance 10 mg daily  2) Patient assistance: -Generic Entresto  copay is $47.00, Doreen copay is $253.82, Jardiance copay is $47.00   3) Education: - Patient has been educated on current HF medications and potential additions to HF medication regimen - Patient verbalizes understanding that over the next few months, these medication doses may change and more medications may be added to optimize HF regimen - Patient has been educated on basic disease state pathophysiology and goals of therapy  Medication Assistance / Insurance Benefits Check: Does the patient have prescription insurance? Prescription Insurance: Medicare  Type of insurance plan:  Does the patient qualify for medication assistance through manufacturers or grants? Pending   Outpatient Pharmacy: Prior to admission outpatient pharmacy: [facility]      Please do not hesitate to reach out with questions or concerns,  Jaun Bash, PharmD, CPP, BCPS, [facility] Heart Failure Pharmacist  Phone - [phone] 11/14/2023 10:43 AM

## 2023-11-14 NOTE — Progress Notes (Signed)
 PROGRESS NOTE    [name]  FMW:[id number] DOB: 04-07-1945 DOA: 11/11/2023 PCP: Arloa Elsie SAUNDERS, MD  No chief complaint on file.   Hospital Course:  [name] is a 78 y.o. male with medical history significant of adrenal insufficiency, chronic kidney disease stage III, heart failure with reduced ejection fraction EF of 45 to 50%, coronary artery disease with lesion to LAD, PAD complex stenting done recently 4 weeks ago, history of beta-blockers insulin -dependent diabetes, chronic hearing loss, hyperlipidemia, hypothyroidism, iron  deficiency anemia, lumbar degenerative disc disease with radiculopathy, chronic eczema, history of Charcot foot of the left, history of diabetic neuropathy obstructive sleep apnea and sarcoidosis, essential hypertension who was admitted  to inpatient psychiatric unit with suicide ideation.  Patient was involuntarily committed after suicide attempt for which he took 10 to 60 tablets of tramadol.  Patient was being evaluated and apparently has denied suicidal thoughts.    However patient became hypoxic and Rapid response was called 09/05  when he became hypoxic.  He had 1 episode of vomiting.  Was placed on 2 L of oxygen .  Also started on DuoNeb and Lasix .  Patient was started on empiric antibiotics therapy for possible aspiration pneumonia.  Workup was done including chest x-ray that suspected the pneumonia.  Due to concern for patient's multitude of medical problems he was transferred to acute medical care for management of the suspected aspiration pneumonia. Hospital course as below  Subjective: Patient was examined at the bedside, states he had an episode of emesis overnight, is anxious about going home.  Also discussed with the cardiologist about considering ischemic eval while in the hospital, yet to make decision. Called son Redell [phone]) provided updates and answered all questions  Objective: Vitals:   11/13/23 1651 11/13/23 1956 11/14/23 0214 11/14/23  0858  BP: 113/78 (!) 117/58 (!) 129/52 132/66  Pulse: 68  68 (!) 58  Resp: 18 18 18    Temp:  (!) 97.5 F (36.4 C) 97.6 F (36.4 C) 97.8 F (36.6 C)  TempSrc:  Oral Oral Oral  SpO2: 96% 97% 95% 95%    Intake/Output Summary (Last 24 hours) at 11/14/2023 1638 Last data filed at 11/14/2023 1431 Gross per 24 hour  Intake 440.26 ml  Output 400 ml  Net 40.26 ml   There were no vitals filed for this visit.  Examination: Constitutional: weak, NAD, calm, comfortable Neck: normal, supple, no masses, no thyromegaly Respiratory: CTAB, normal respiratory effort. No accessory muscle use Cardiovascular: Regular rate and rhythm, no murmurs / rubs / gallops, no lower extremity edema Abdomen: no tenderness, no masses palpated. No hepatosplenomegaly. Bowel sounds positive.  Musculoskeletal: Good range of motion, no joint swelling or tenderness, Skin: no rashes, lesions, ulcers. No induration Neurologic: CN 2-12 grossly intact. Sensation intact, DTR normal. Strength 5/5 in all 4.  Psychiatric: Awake, alert, cooperative  Assessment & Plan:  Principal Problem:   Aspiration pneumonia (HCC) Active Problems:   Sarcoidosis   Coronary artery disease with exertional angina (HCC)   GERD   OSA (obstructive sleep apnea)   History of adrenal insufficiency   Pulmonary sarcoidosis (HCC)   Uncontrolled type 2 diabetes mellitus with hyperglycemia, with long-term current use of insulin  (HCC)   Hyperlipidemia   Stage 3a chronic kidney disease (CKD) (HCC)   Suicidal behavior with attempted self-injury (HCC)   Non-ST elevation (NSTEMI) myocardial infarction (HCC)   HFrEF (heart failure with reduced ejection fraction) (HCC)   Pressure injury of skin   Suicidal attempt vs Unintentional overdose  Acute toxic encephalopathy due to tramadol overdose - s/p Narcan  in ER after suicide attempt overdose on 10-60 tabs of tramadol - Mental status back to baseline - Off 1:1, cleared by Psychiatry, appreciate recs    Acute hypoxic respiratory failure - resolved - Suspect due to tramadol overdose and aspiration pneumonia  Aspiration pneumonia - Suspect aspiration acute as patient was found hypoxic after having episode of emesis.  - Leukocytosis (was on steroids) improving - CXR with bilateral lower lobe airspace opacities, R > left - On IV Levaquin   NSTEMI CAD - Troponin elevated, downtrending, reports having angina at times with exertion - CTA negative for PE - Echo showed EF 35 to 40% with hypokinesis of the anterior wall, mid and distal anterior septum, inferior septum, posterior wall, and apex, G1 DD, elevated LVEDP  - was on IV heparin  gtt. for 48 hours - Add aspirin  to Plavix , DAPT for 1 year, Zetia , statin - On Imdur  30 mg daily, metoprolol  succinate 25 mg daily - Seen by cardiology, appreciate recs, initially patient and spouse would like to have LHC at a later time when patient is more stable.  At this time considering doing workup while admitted   Chronic HFmrEF - Echo LVEF 45-50% with a septal hypokinesis of the mid to apical anterior/lateral segment - Appears euvolemic on exam - Started Entresto , metoprolol . recommend adding SGLT2 prior to discharge  CKD stage3 - Cr at baseline 1.2-1.5  Hypophosphatemia Hypokalemia - Monitor and replete as needed   HTN - Continue hydralazine , Imdur , Entresto , metoprolol  - Hold amlodipine , start Entresto  instead of losartan    HLD - Zetia , statin   Diabetes mellitus 2 - Hb A1c 10.6 (08/25) - Reports taking 70/30 at home 35u ? TID - Start Novolin 70/30 20 units bid - Monitor blood glucose and titrate as needed   S/p left foot wound debridement Recent left foot cellulitis - Standing in the boot is allowed, but walking is not recommended  - Follow-up with podiatry outpatient   Normocytic anemia -Monitor Hb intermittently   History of sarcoidosis - Continue prednisone  10 mg daily  Hx adrenal insufficiency: Continue prednisone     Hypothyroidism Continue levothyroxine   S/p fall on 09/08 Mechanical fall when patient tried to transfer unassisted, hit his head Stable, CT head negative   PT/OT rec Home PT/OT  DVT prophylaxis: Heparin  gtt   Code Status: Full Code Disposition:  Pending  Consultants:  Treatment Team:  Consulting Physician: Gollan, Timothy J, MD Psychiatry  Procedures:  None  Antimicrobials:  Anti-infectives (From admission, onward)    Start     Dose/Rate Route Frequency Ordered Stop   11/13/23 2200  levofloxacin  (LEVAQUIN ) IVPB 500 mg        500 mg 100 mL/hr over 60 Minutes Intravenous Every 24 hours 11/13/23 0737 11/16/23 2159   11/11/23 2200  doxycycline  (VIBRA -TABS) tablet 100 mg  Status:  Discontinued        100 mg Oral Every 12 hours 11/11/23 1927 11/11/23 1959   11/11/23 2200  cefTRIAXone  (ROCEPHIN ) 2 g in sodium chloride  0.9 % 100 mL IVPB  Status:  Discontinued        2 g 200 mL/hr over 30 Minutes Intravenous Every 24 hours 11/11/23 1953 11/11/23 2007   11/11/23 2200  doxycycline  (VIBRAMYCIN ) 100 mg in sodium chloride  0.9 % 250 mL IVPB  Status:  Discontinued        100 mg 125 mL/hr over 120 Minutes Intravenous Every 12 hours 11/11/23 1957 11/11/23 2130   11/11/23  2200  levofloxacin  (LEVAQUIN ) IVPB 750 mg  Status:  Discontinued        750 mg 100 mL/hr over 90 Minutes Intravenous Every 24 hours 11/11/23 2015 11/13/23 0737   11/11/23 2100  Ampicillin -Sulbactam (UNASYN ) 3 g in sodium chloride  0.9 % 100 mL IVPB  Status:  Discontinued        3 g 200 mL/hr over 30 Minutes Intravenous Every 6 hours 11/11/23 2007 11/11/23 2130   11/11/23 2015  cefTRIAXone  (ROCEPHIN ) 1 g in sodium chloride  0.9 % 100 mL IVPB  Status:  Discontinued        1 g 200 mL/hr over 30 Minutes Intravenous Every 24 hours 11/11/23 1927 11/11/23 1955       Data Reviewed: I have personally reviewed following labs and imaging studies CBC: Recent Labs  Lab 11/09/23 1650 11/10/23 1934 11/11/23 0749 11/12/23 0434  11/13/23 0321 11/14/23 0319  WBC 12.6* 17.4* 14.3* 14.3* 19.3* 12.5*  NEUTROABS 9.8*  --   --   --   --   --   HGB 13.9 12.8* 12.6* 12.9* 12.2* 11.9*  HCT 42.4 40.1 40.7 41.0 37.1* 35.0*  MCV 99.1 102.0* 102.3* 102.5* 99.2 96.7  PLT 331 268 267 269 249 245   Basic Metabolic Panel: Recent Labs  Lab 11/11/23 0749 11/11/23 2016 11/12/23 0434 11/12/23 1939 11/13/23 0321 11/14/23 0319  NA 139 139 139  --  131* 136  K 3.8 3.7 3.8  --  3.5 3.2*  CL 100 97* 96*  --  91* 96*  CO2 32 33* 32  --  31 30  GLUCOSE 270* 261* 237*  --  194* 147*  BUN 20 18 16   --  26* 31*  CREATININE 1.28* 1.38* 1.33*  --  1.52* 1.41*  CALCIUM  8.6* 8.4* 8.4*  --  8.1* 7.7*  MG  --  1.7 2.0  --  2.0 1.9  PHOS  --   --  1.2* 2.5 2.3* 2.8   GFR: Estimated Creatinine Clearance: 49.7 mL/min (A) (by C-G formula based on SCr of 1.41 mg/dL (H)). Liver Function Tests: Recent Labs  Lab 11/09/23 1650 11/10/23 1934 11/11/23 2016 11/12/23 0434  AST 46* 28 29 28   ALT 29 20 18 20   ALKPHOS 62 56 66 69  BILITOT 1.2 1.2 1.2 1.4*  PROT 7.1 6.7 6.8 7.0  ALBUMIN 3.5 3.4* 3.0* 3.0*   CBG: Recent Labs  Lab 11/14/23 0213 11/14/23 0640 11/14/23 0737 11/14/23 0831 11/14/23 1122  GLUCAP 162* 225* 225* 195* 244*    Recent Results (from the past 240 hours)  Resp panel by RT-PCR (RSV, Flu A&B, Covid) Anterior Nasal Swab     Status: None   Collection Time: 11/10/23 10:55 AM   Specimen: Anterior Nasal Swab  Result Value Ref Range Status   SARS Coronavirus 2 by RT PCR NEGATIVE NEGATIVE Final   Influenza A by PCR NEGATIVE NEGATIVE Final   Influenza B by PCR NEGATIVE NEGATIVE Final    Comment: (NOTE) The Xpert Xpress SARS-CoV-2/FLU/RSV plus assay is intended as an aid in the diagnosis of influenza from Nasopharyngeal swab specimens and should not be used as a sole basis for treatment. Nasal washings and aspirates are unacceptable for Xpert Xpress SARS-CoV-2/FLU/RSV testing.  Fact Sheet for  Patients: BloggerCourse.com  Fact Sheet for Healthcare Providers: SeriousBroker.it  This test is not yet approved or cleared by the [place]  FDA and has been authorized for detection and/or diagnosis of SARS-CoV-2 by FDA under an Emergency Use Authorization (EUA).  This EUA will remain in effect (meaning this test can be used) for the duration of the COVID-19 declaration under Section 564(b)(1) of the Act, 21 U.S.C. section 360bbb-3(b)(1), unless the authorization is terminated or revoked.     Resp Syncytial Virus by PCR NEGATIVE NEGATIVE Final    Comment: (NOTE) Fact Sheet for Patients: BloggerCourse.com  Fact Sheet for Healthcare Providers: SeriousBroker.it  This test is not yet approved or cleared by the [place]  FDA and has been authorized for detection and/or diagnosis of SARS-CoV-2 by FDA under an Emergency Use Authorization (EUA). This EUA will remain in effect (meaning this test can be used) for the duration of the COVID-19 declaration under Section 564(b)(1) of the Act, 21 U.S.C. section 360bbb-3(b)(1), unless the authorization is terminated or revoked.  Performed at [facility], [address], [place], [place] [address]      Radiology Studies: CT HEAD WO CONTRAST ( ) Result Date: 11/13/2023 EXAM: CT HEAD WITHOUT CONTRAST 11/13/2023 06:45:29 PM TECHNIQUE: CT of the head was performed without the administration of intravenous contrast. Automated exposure control, iterative reconstruction, and/or weight based adjustment of the mA/kV was utilized to reduce the radiation dose to as low as reasonably achievable. COMPARISON: CT head 11/11/23 CLINICAL HISTORY: Fall, head injury on Heparin  gtt. FINDINGS: BRAIN AND VENTRICLES: No acute hemorrhage. No evidence of acute infarct. No hydrocephalus. No extra-axial collection. No mass effect or midline shift. Nonspecific  hypoattenuation in the periventricular and subcortical white matter, most likely representing chronic small vessel disease. ORBITS: Bilateral lens replacement. SINUSES: Small bilateral mastoid effusions similar to prior. Mucosal thickening in the right sphenoid sinus slightly increased from prior. SOFT TISSUES AND SKULL: No acute soft tissue abnormality. No skull fracture. VASCULATURE: Atherosclerosis involving the carotid siphons and intracranial vertebral arteries. IMPRESSION: 1. No acute intracranial abnormality related to the fall and head injury. 2. Mild chronic small vessel disease. 3. Mucosal thickening in the right sphenoid sinus, slightly increased from prior. Electronically signed by: Donnice Mania MD 11/13/2023 09:11 PM EDT RP Workstation: [id number]    Scheduled Meds:  aspirin  EC  81 mg Oral Daily   clopidogrel   75 mg Oral Daily   ezetimibe   10 mg Oral Daily   guaiFENesin   600 mg Oral BID   hydrALAZINE   10 mg Oral BID   insulin  aspart protamine- aspart  20 Units Subcutaneous BID WC   isosorbide  mononitrate  30 mg Oral Daily   levothyroxine   50 mcg Oral Q0600   metoprolol  succinate  25 mg Oral Daily   pantoprazole  (PROTONIX ) IV  40 mg Intravenous QHS   predniSONE   10 mg Oral QHS   rosuvastatin   5 mg Oral QHS   sacubitril -valsartan   1 tablet Oral BID   Continuous Infusions:  levofloxacin  (LEVAQUIN ) IV Stopped (11/13/23 2312)   promethazine  (PHENERGAN ) injection (IM or IVPB) Stopped (11/14/23 0459)     LOS: 3 days  MDM: Patient is high risk for one or more organ failure.  They necessitate ongoing hospitalization for continued IV therapies and subsequent lab monitoring. Total time spent interpreting labs and vitals, reviewing the medical record, coordinating care amongst consultants and care team members, directly assessing and discussing care with the patient and/or family: 55 min  Laree Lock, MD [facility]  To contact the attending physician between 7A-7P please use  Epic Chat. To contact the covering physician during after hours 7P-7A, please review Amion.  11/14/2023, 4:38 PM   *This document has been created with the assistance of dictation software. Please  excuse typographical errors. *

## 2023-11-14 NOTE — Progress Notes (Signed)
 Mobility Specialist - Progress Note   11/14/23 1131  Mobility  Activity Stood at bedside;Pivoted/transferred to/from [facility]  Level of Assistance Minimal assist, patient does 75% or more (MinA +2)  Assistive Device Front wheel walker  Distance Ambulated (ft) 2 ft  Activity Response Tolerated well  Mobility visit 1 Mobility  Mobility Specialist Start Time (ACUTE ONLY) [id number]  Mobility Specialist Stop Time (ACUTE ONLY) 1000  Mobility Specialist Time Calculation (min) (ACUTE ONLY) 4 min   Pt transferred to the [facility] MinA +2-- remains able to follow weight bearing restrictions. Pt left seated on the [facility] with call bell within reach, mat present, family member at bedside and RN notified. Pt educated multiple times to utilize call bell when ready to return to bed.  America Silvan Mobility Specialist 11/14/23 11:37 AM

## 2023-11-14 NOTE — Progress Notes (Signed)
 Rounding Note   Patient Name: [name] Date of Encounter: 11/14/2023  [facility] Cardiologist: Oneil Parchment, MD  Subjective Patient and his wife have questions regarding his elevated troponin and if it is safe for him to be evaluated as outpatient. He continues to deny chest pain and shortness of breath.   Scheduled Meds:  aspirin  EC  81 mg Oral Daily   clopidogrel   75 mg Oral Daily   ezetimibe   10 mg Oral Daily   guaiFENesin   600 mg Oral BID   hydrALAZINE   10 mg Oral BID   insulin  aspart protamine- aspart  20 Units Subcutaneous BID WC   isosorbide  mononitrate  30 mg Oral Daily   levothyroxine   50 mcg Oral Q0600   metoprolol  succinate  25 mg Oral Daily   pantoprazole  (PROTONIX ) IV  40 mg Intravenous QHS   predniSONE   10 mg Oral QHS   rosuvastatin   5 mg Oral QHS   sacubitril -valsartan   1 tablet Oral BID   Continuous Infusions:  levofloxacin  (LEVAQUIN ) IV Stopped (11/13/23 2312)   promethazine  (PHENERGAN ) injection (IM or IVPB) Stopped (11/14/23 0459)   PRN Meds: acetaminophen  **OR** acetaminophen , albuterol , magnesium  hydroxide, montelukast , nitroGLYCERIN , ondansetron  **OR** ondansetron  (ZOFRAN ) IV, promethazine  (PHENERGAN ) injection (IM or IVPB), traZODone    Vital Signs  Vitals:   11/13/23 1651 11/13/23 1956 11/14/23 0214 11/14/23 0858  BP: 113/78 (!) 117/58 (!) 129/52 132/66  Pulse: 68  68 (!) 58  Resp: 18 18 18    Temp:  (!) 97.5 F (36.4 C) 97.6 F (36.4 C) 97.8 F (36.6 C)  TempSrc:  Oral Oral Oral  SpO2: 96% 97% 95% 95%    Intake/Output Summary (Last 24 hours) at 11/14/2023 1513 Last data filed at 11/14/2023 1431 Gross per 24 hour  Intake 609.37 ml  Output 400 ml  Net 209.37 ml      11/10/2023    7:37 PM 11/09/2023    4:48 PM 10/09/2023   11:22 PM  Last 3 Weights  Weight (lbs)  200 lb 200 lb 1.6 oz  Weight (kg)  90.719 kg 90.765 kg     Information is confidential and restricted. Go to Review Flowsheets to unlock data.      Telemetry Sinus  rhythm with frequent PACs/PVCs - Personally Reviewed  Physical Exam  GEN: No acute distress.   Neck: Unable to assess JVD due to body habitus Cardiac: RRR, no murmurs, rubs, or gallops.  Respiratory: Clear to auscultation bilaterally. GI: Soft, nontender, non-distended  MS: No edema; No deformity. Neuro:  Nonfocal  Psych: Normal affect   Labs High Sensitivity Troponin:   Recent Labs  Lab 11/11/23 2016 11/11/23 2244 11/12/23 0434  TROPONINIHS 1,170* 992* 753*     Chemistry Recent Labs  Lab 11/10/23 1934 11/11/23 0749 11/11/23 2016 11/12/23 0434 11/13/23 0321 11/14/23 0319  NA 137   < > 139 139 131* 136  K 4.0   < > 3.7 3.8 3.5 3.2*  CL 98   < > 97* 96* 91* 96*  CO2 29   < > 33* 32 31 30  GLUCOSE 256*   < > 261* 237* 194* 147*  BUN 22   < > 18 16 26* 31*  CREATININE 1.49*   < > 1.38* 1.33* 1.52* 1.41*  CALCIUM  8.9   < > 8.4* 8.4* 8.1* 7.7*  MG  --    < > 1.7 2.0 2.0 1.9  PROT 6.7  --  6.8 7.0  --   --   ALBUMIN  3.4*  --  3.0* 3.0*  --   --   AST 28  --  29 28  --   --   ALT 20  --  18 20  --   --   ALKPHOS 56  --  66 69  --   --   BILITOT 1.2  --  1.2 1.4*  --   --   GFRNONAA 48*   < > 53* 55* 47* 51*  ANIONGAP 10   < > 9 11 9 10    < > = values in this interval not displayed.    Lipids  Recent Labs  Lab 11/13/23 0321  CHOL 165  TRIG 117  HDL 34*  LDLCALC 108*  CHOLHDL 4.9    Hematology Recent Labs  Lab 11/12/23 0434 11/13/23 0321 11/14/23 0319  WBC 14.3* 19.3* 12.5*  RBC 4.00* 3.74* 3.62*  HGB 12.9* 12.2* 11.9*  HCT 41.0 37.1* 35.0*  MCV 102.5* 99.2 96.7  MCH 32.3 32.6 32.9  MCHC 31.5 32.9 34.0  RDW 13.2 13.2 13.2  PLT 269 249 245   Thyroid   Recent Labs  Lab 11/11/23 0749  TSH 3.163    BNP Recent Labs  Lab 11/11/23 2016  BNP 838.2*    DDimer  Recent Labs  Lab 11/11/23 2016  DDIMER 1.48*     Radiology  DG Chest Port 1 View Result Date: 11/12/2023 IMPRESSION: 1. No significant change in patchy airspace disease in both lung  bases. 2. Stable cardiomegaly. 3. Minimal pleural effusions. Electronically Signed   By: Francis Quam M.D.   On: 11/12/2023 07:56   CT Angio Chest Pulmonary Embolism (PE) W or WO Contrast Result Date: 11/12/2023 MPRESSION: 1. No evidence of pulmonary embolism. 2. Mild right pleural effusion and tiny left effusion. 3. Peribronchial opacities, compatible with bronchitis/bronchiolitis. Electronically signed by: Evalene Coho MD 11/12/2023 05:52 AM EDT RP Workstation: [id number]   CT HEAD WO CONTRAST ( ) Result Date: 11/11/2023 IMPRESSION: 1. No evidence of acute intracranial abnormality. 2. Mild chronic small vessel ischemic disease. Electronically Signed   By: Dasie Hamburg M.D.   On: 11/11/2023 19:05   Cardiac Studies  11/12/2023 Echo complete 1. Left ventricular ejection fraction, by estimation, is 35 to 40%. The  left ventricle has moderately decreased function. The left ventricle  demonstrates regional wall motion abnormalities (see scoring  diagram/findings for description). Left ventricular   diastolic parameters are consistent with Grade I diastolic dysfunction  (impaired relaxation). Elevated left ventricular end-diastolic pressure.   2. Right ventricular systolic function is normal. The right ventricular  size is normal. There is normal pulmonary artery systolic pressure.   3. The mitral valve is normal in structure. No evidence of mitral valve  regurgitation. No evidence of mitral stenosis.   4. The aortic valve is tricuspid. There is mild calcification of the  aortic valve. There is mild thickening of the aortic valve. Aortic valve  regurgitation is not visualized. No aortic stenosis is present.   5. The inferior vena cava is normal in size with greater than 50%  respiratory variability, suggesting right atrial pressure of 3 mmHg.   08/2021 LHC Prox Cx to Mid Cx lesion is 50% stenosed.   Prox LAD lesion is 50% stenosed.   Prox LAD to Mid LAD lesion is 15% stenosed.   1st  Diag-1 lesion is 40% stenosed.   1st Diag-2 lesion is 20% stenosed.   RPAV lesion is 15% stenosed.   Prox RCA lesion is 20% stenosed.  Mid RCA lesion is 20% stenosed.   Patient Profile   78 y.o. male  with a hx of CAD s/p multiple PCIs , HFmrEF, pulmonary sarcoidosis, HNT, PAD, HLD, adrenal insufficiency on chronic prednisone , chronic hearing loss, anemia with prior GI bleed, CKD stage who is being seen for ongoing management of NSTEMI.   Assessment & Plan   NSTEMI - Patient admitted involuntarily for suicidal ideation. On 9/6 he developed hypoxia and rapid response was called. Suspicion for PNA. Troponin elevated to 1170 and patient was started on IV heparin .  - Chest pain reported - Echo showed EF 35 to 40% with hypokinesis of the anterior wall, mid and distal anterior septum, inferior septum, posterior wall, and apex, G1 DD, elevated LVEDP - Completed 48 hours of IV heparin  - Continue DAPT with ASA and Plavix  for 1 year - Continue Zetia , metoprolol , and rosuvastatin . He reportedly did not tolerate higher dosing of statins.  - Recommendation for cardiac catheterization given elevated troponin and reduced EF has been discussed multiple times over the course of hospitalization. Patient and his wife agree that they would like to allow him to recover from acute issues prior to considering ischemic evaluation, although they are wanting to further discuss this now that he is more stable. Will defer to MD.   HFrEF - Echo this admission with reduced EF 35-40%, previously 45-50% - Appears euvolemic on exam - Started on Entresto  and metoprolol  this admission, continue to follow renal function - Consider addition of SGLT2i prior to discharge - Will hold off on addition of MRA at this time. Could consider addition if renal function improves.   Suicidal ideation/attempt - Ongoing management per IM and psychiatry  ?Aspiration PNA Pulmonary sarcoidosis - Ongoing management per IM  For questions  or updates, please contact [facility] Please consult [email or web address] for contact info under     Signed, Lesley LITTIE Maffucci, PA-C  11/14/2023, 3:13 PM

## 2023-11-15 DIAGNOSIS — I5020 Unspecified systolic (congestive) heart failure: Secondary | ICD-10-CM | POA: Diagnosis not present

## 2023-11-15 DIAGNOSIS — I739 Peripheral vascular disease, unspecified: Secondary | ICD-10-CM | POA: Diagnosis not present

## 2023-11-15 DIAGNOSIS — J690 Pneumonitis due to inhalation of food and vomit: Secondary | ICD-10-CM | POA: Diagnosis not present

## 2023-11-15 DIAGNOSIS — I255 Ischemic cardiomyopathy: Secondary | ICD-10-CM | POA: Diagnosis not present

## 2023-11-15 DIAGNOSIS — I214 Non-ST elevation (NSTEMI) myocardial infarction: Secondary | ICD-10-CM | POA: Diagnosis not present

## 2023-11-15 LAB — BASIC METABOLIC PANEL WITH GFR
Anion gap: 9 (ref 5–15)
BUN: 22 mg/dL (ref 8–23)
CO2: 29 mmol/L (ref 22–32)
Calcium: 8 mg/dL — ABNORMAL LOW (ref 8.9–10.3)
Chloride: 103 mmol/L (ref 98–111)
Creatinine, Ser: 1.08 mg/dL (ref 0.61–1.24)
GFR, Estimated: >60 mL/min (ref >60)
Glucose, Bld: 204 mg/dL — ABNORMAL HIGH (ref 70–99)
Potassium: 4.1 mmol/L (ref 3.5–5.1)
Sodium: 141 mmol/L (ref 135–145)

## 2023-11-15 LAB — GLUCOSE, CAPILLARY
Glucose-Capillary: 195 mg/dL — ABNORMAL HIGH (ref 70–99)
Glucose-Capillary: 320 mg/dL — ABNORMAL HIGH (ref 70–99)
Glucose-Capillary: 411 mg/dL — ABNORMAL HIGH (ref 70–99)
Glucose-Capillary: 125 mg/dL — ABNORMAL HIGH (ref 70–99)
Glucose-Capillary: 89 mg/dL (ref 70–99)

## 2023-11-15 LAB — GLUCOSE, RANDOM: Glucose, Bld: 412 mg/dL — ABNORMAL HIGH (ref 70–99)

## 2023-11-15 LAB — CBC
HCT: 39.5 % (ref 39.0–52.0)
Hemoglobin: 13.1 g/dL (ref 13.0–17.0)
MCH: 32.3 pg (ref 26.0–34.0)
MCHC: 33.2 g/dL (ref 30.0–36.0)
MCV: 97.3 fL (ref 80.0–100.0)
Platelets: 245 K/uL (ref 150–400)
RBC: 4.06 MIL/uL — ABNORMAL LOW (ref 4.22–5.81)
RDW: 13.3 % (ref 11.5–15.5)
WBC: 10.8 K/uL — ABNORMAL HIGH (ref 4.0–10.5)
nRBC: 0 % (ref 0.0–0.2)

## 2023-11-15 MED ORDER — INSULIN ASPART PROT & ASPART (70-30 MIX) 100 UNIT/ML ~~LOC~~ SUSP
30.0000 [IU] | Freq: Two times a day (BID) | SUBCUTANEOUS | Status: DC
Start: 2023-11-15 — End: 2023-11-16
  Administered 2023-11-15 (×2): 30 [IU] via SUBCUTANEOUS
  Filled 2023-11-15: qty 10

## 2023-11-15 MED ORDER — INSULIN ASPART 100 UNIT/ML IJ SOLN
0.0000 [IU] | Freq: Three times a day (TID) | INTRAMUSCULAR | Status: DC
  Administered 2023-11-15: 2 [IU] via SUBCUTANEOUS
  Administered 2023-11-16 (×2): 8 [IU] via SUBCUTANEOUS
  Administered 2023-11-17: 5 [IU] via SUBCUTANEOUS
  Administered 2023-11-17: 3 [IU] via SUBCUTANEOUS
  Administered 2023-11-17: 15 [IU] via SUBCUTANEOUS
  Administered 2023-11-18: 8 [IU] via SUBCUTANEOUS
  Filled 2023-11-15 (×8): qty 1

## 2023-11-15 MED ORDER — SPIRONOLACTONE 12.5 MG HALF TABLET
12.5000 mg | ORAL_TABLET | Freq: Every day | ORAL | Status: DC
  Administered 2023-11-15 – 2023-11-18 (×3): 12.5 mg via ORAL
  Filled 2023-11-15 (×4): qty 1

## 2023-11-15 MED ORDER — INSULIN ASPART 100 UNIT/ML IJ SOLN
15.0000 [IU] | Freq: Once | INTRAMUSCULAR | Status: AC
  Administered 2023-11-15: 15 [IU] via SUBCUTANEOUS

## 2023-11-15 MED ORDER — GERHARDT'S BUTT CREAM
TOPICAL_CREAM | Freq: Two times a day (BID) | CUTANEOUS | Status: DC
  Filled 2023-11-15: qty 60

## 2023-11-15 NOTE — Plan of Care (Signed)

## 2023-11-15 NOTE — Progress Notes (Signed)
 Rounding Note   Patient Name: [name] Date of Encounter: 11/15/2023  [facility] Cardiologist: Oneil Parchment, MD  Subjective Patient reports some discomfort in his chest today and tight breathing. He has now decided he would like to undergo cardiac catheterization to have a full workup completed prior to discharge.   Scheduled Meds:  aspirin  EC  81 mg Oral Daily   clopidogrel   75 mg Oral Daily   ezetimibe   10 mg Oral Daily   Gerhardt's butt cream   Topical BID   guaiFENesin   600 mg Oral BID   hydrALAZINE   10 mg Oral BID   insulin  aspart  0-15 Units Subcutaneous TID WC   insulin  aspart protamine- aspart  30 Units Subcutaneous BID WC   isosorbide  mononitrate  30 mg Oral Daily   levothyroxine   50 mcg Oral Q0600   metoprolol  succinate  25 mg Oral Daily   pantoprazole  (PROTONIX ) IV  40 mg Intravenous QHS   predniSONE   10 mg Oral QHS   rosuvastatin   5 mg Oral QHS   sacubitril -valsartan   1 tablet Oral BID   spironolactone   12.5 mg Oral Daily   Continuous Infusions:  levofloxacin  (LEVAQUIN ) IV 500 mg (11/14/23 2105)   promethazine  (PHENERGAN ) injection (IM or IVPB) Stopped (11/14/23 0459)   PRN Meds: acetaminophen  **OR** acetaminophen , albuterol , magnesium  hydroxide, montelukast , nitroGLYCERIN , ondansetron  **OR** ondansetron  (ZOFRAN ) IV, promethazine  (PHENERGAN ) injection (IM or IVPB), traZODone    Vital Signs  Vitals:   11/14/23 0858 11/14/23 1923 11/15/23 0343 11/15/23 0739  BP: 132/66 (!) 128/59 (!) 151/56 (!) 140/77  Pulse: (!) 58 66 71 82  Resp:  16 20 16   Temp: 97.8 F (36.6 C) 98.4 F (36.9 C) 98.6 F (37 C) 98.1 F (36.7 C)  TempSrc: Oral Oral Oral Oral  SpO2: 95% 95% 96% 97%    Intake/Output Summary (Last 24 hours) at 11/15/2023 1213 Last data filed at 11/15/2023 1008 Gross per 24 hour  Intake 960 ml  Output 200 ml  Net 760 ml      11/10/2023    7:37 PM 11/09/2023    4:48 PM 10/09/2023   11:22 PM  Last 3 Weights  Weight (lbs)  200 lb 200 lb 1.6  oz  Weight (kg)  90.719 kg 90.765 kg     Information is confidential and restricted. Go to Review Flowsheets to unlock data.      Telemetry Sinus rhythm with frequent PACs/PVCs - Personally Reviewed  Physical Exam  GEN: No acute distress.   Neck: Unable to assess JVD due to body habitus Cardiac: RRR, no murmurs, rubs, or gallops.  Respiratory: Clear to auscultation bilaterally. GI: Soft, nontender, non-distended  MS: No edema; No deformity. Neuro:  Nonfocal  Psych: Normal affect   Labs High Sensitivity Troponin:   Recent Labs  Lab 11/11/23 2016 11/11/23 2244 11/12/23 0434  TROPONINIHS 1,170* 992* 753*     Chemistry Recent Labs  Lab 11/10/23 1934 11/11/23 0749 11/11/23 2016 11/12/23 0434 11/13/23 0321 11/14/23 0319 11/15/23 0326  NA 137   < > 139 139 131* 136 141  K 4.0   < > 3.7 3.8 3.5 3.2* 4.1  CL 98   < > 97* 96* 91* 96* 103  CO2 29   < > 33* 32 31 30 29   GLUCOSE 256*   < > 261* 237* 194* 147* 204*  BUN 22   < > 18 16 26* 31* 22  CREATININE 1.49*   < > 1.38* 1.33* 1.52* 1.41* 1.08  CALCIUM  8.9   < > 8.4* 8.4* 8.1* 7.7* 8.0*  MG  --    < > 1.7 2.0 2.0 1.9  --   PROT 6.7  --  6.8 7.0  --   --   --   ALBUMIN 3.4*  --  3.0* 3.0*  --   --   --   AST 28  --  29 28  --   --   --   ALT 20  --  18 20  --   --   --   ALKPHOS 56  --  66 69  --   --   --   BILITOT 1.2  --  1.2 1.4*  --   --   --   GFRNONAA 48*   < > 53* 55* 47* 51* >60  ANIONGAP 10   < > 9 11 9 10 9    < > = values in this interval not displayed.    Lipids  Recent Labs  Lab 11/13/23 0321  CHOL 165  TRIG 117  HDL 34*  LDLCALC 108*  CHOLHDL 4.9    Hematology Recent Labs  Lab 11/13/23 0321 11/14/23 0319 11/15/23 0326  WBC 19.3* 12.5* 10.8*  RBC 3.74* 3.62* 4.06*  HGB 12.2* 11.9* 13.1  HCT 37.1* 35.0* 39.5  MCV 99.2 96.7 97.3  MCH 32.6 32.9 32.3  MCHC 32.9 34.0 33.2  RDW 13.2 13.2 13.3  PLT 249 245 245   Thyroid   Recent Labs  Lab 11/11/23 0749  TSH 3.163    BNP Recent Labs   Lab 11/11/23 2016  BNP 838.2*    DDimer  Recent Labs  Lab 11/11/23 2016  DDIMER 1.48*     Radiology  DG Chest Port 1 View Result Date: 11/12/2023 IMPRESSION: 1. No significant change in patchy airspace disease in both lung bases. 2. Stable cardiomegaly. 3. Minimal pleural effusions. Electronically Signed   By: Francis Quam M.D.   On: 11/12/2023 07:56   CT Angio Chest Pulmonary Embolism (PE) W or WO Contrast Result Date: 11/12/2023 MPRESSION: 1. No evidence of pulmonary embolism. 2. Mild right pleural effusion and tiny left effusion. 3. Peribronchial opacities, compatible with bronchitis/bronchiolitis. Electronically signed by: Evalene Coho MD 11/12/2023 05:52 AM EDT RP Workstation: [id number]   CT HEAD WO CONTRAST ( ) Result Date: 11/11/2023 IMPRESSION: 1. No evidence of acute intracranial abnormality. 2. Mild chronic small vessel ischemic disease. Electronically Signed   By: Dasie Hamburg M.D.   On: 11/11/2023 19:05   Cardiac Studies  11/12/2023 Echo complete 1. Left ventricular ejection fraction, by estimation, is 35 to 40%. The  left ventricle has moderately decreased function. The left ventricle  demonstrates regional wall motion abnormalities (see scoring  diagram/findings for description). Left ventricular   diastolic parameters are consistent with Grade I diastolic dysfunction  (impaired relaxation). Elevated left ventricular end-diastolic pressure.   2. Right ventricular systolic function is normal. The right ventricular  size is normal. There is normal pulmonary artery systolic pressure.   3. The mitral valve is normal in structure. No evidence of mitral valve  regurgitation. No evidence of mitral stenosis.   4. The aortic valve is tricuspid. There is mild calcification of the  aortic valve. There is mild thickening of the aortic valve. Aortic valve  regurgitation is not visualized. No aortic stenosis is present.   5. The inferior vena cava is normal in size with  greater than 50%  respiratory variability, suggesting right atrial pressure of 3 mmHg.  08/2021 LHC Prox Cx to Mid Cx lesion is 50% stenosed.   Prox LAD lesion is 50% stenosed.   Prox LAD to Mid LAD lesion is 15% stenosed.   1st Diag-1 lesion is 40% stenosed.   1st Diag-2 lesion is 20% stenosed.   RPAV lesion is 15% stenosed.   Prox RCA lesion is 20% stenosed.   Mid RCA lesion is 20% stenosed.   Patient Profile   78 y.o. male  with a hx of CAD s/p multiple PCIs , HFmrEF, pulmonary sarcoidosis, HNT, PAD, HLD, adrenal insufficiency on chronic prednisone , chronic hearing loss, anemia with prior GI bleed, CKD stage who is being seen for ongoing management of NSTEMI.   Assessment & Plan   NSTEMI - Patient admitted involuntarily for suicidal ideation. On 9/6 he developed hypoxia and rapid response was called. Suspicion for PNA. Troponin elevated to 1170 and patient was started on IV heparin .  - No chest pain reported - Echo showed EF 35 to 40% with hypokinesis of the anterior wall, mid and distal anterior septum, inferior septum, posterior wall, and apex, G1 DD, elevated LVEDP - Completed 48 hours of IV heparin  - Continue DAPT with ASA and Plavix  for 1 year - Continue Zetia , metoprolol , and rosuvastatin . He reportedly did not tolerate higher dosing of statins.  - Recommendation for cardiac catheterization given elevated troponin and reduced EF has been discussed multiple times over the course of hospitalization and previously deferred due to patient wishes to focus on recovering from other acute issues.  - Discussed again with patient today and he would like to proceed with cardiac catheterization which is tentatively scheduled for Friday Informed Consent   Shared Decision Making/Informed Consent The risks [stroke (1 in 1000), death (1 in 1000), kidney failure [usually temporary] (1 in 500), bleeding (1 in 200), allergic reaction [possibly serious] (1 in 200)], benefits (diagnostic support and  management of coronary artery disease) and alternatives of a cardiac catheterization were discussed in detail with Mr. Gregory and he is willing to proceed.     HFrEF - Echo this admission with reduced EF 35-40%, previously 45-50% - Appears euvolemic on exam - Started on Entresto  and metoprolol  this admission - Start spironolactone  12.5 mg daily - Consider addition of SGLT2i prior to discharge  Suicidal ideation/attempt - Ongoing management per IM and psychiatry  ?Aspiration PNA Pulmonary sarcoidosis - Ongoing management per IM  For questions or updates, please contact [facility] Please consult [email or web address] for contact info under     Signed, Lesley LITTIE Maffucci, PA-C  11/15/2023, 12:13 PM

## 2023-11-15 NOTE — Progress Notes (Signed)
 Physical Therapy Treatment Patient Details Name: [name] MRN: [id number] DOB: 11/14/1945 Today's Date: 11/15/2023   History of Present Illness Pt is a 78 y.o. male with L foot cellulitis s/p recent debridement with Dr. Gunnar at [facility] who was admitted recently to inpatient psychiatric unit with suicide ideation. He was involuntarily committed after suicide attempt for which he took 10 to 60 tablets of tramadol. Rapid response was called on 9/6 when he became hypoxic and pt was transferred to acute medical care for management of the suspected aspiration pneumonia. MD assessment also includes NSTEMI.  PMH of adrenal insufficiency, CKD3, heart failure with reduced ejection fraction EF of 45 to 50%, CAD with lesion to LAD, PAD complex stenting done recently 4 weeks ago, history of beta-blockers insulin -dependent diabetes, chronic hearing loss, HLD, hypothyroidism, iron  deficiency anemia, lumbar degenerative disc disease with radiculopathy, chronic eczema, history of Charcot foot of the left, history of diabetic neuropathy, HTN, OSA, and sarcoidosis    PT Comments  Pt was long sitting in bed upon arrival. He is alert and or and agreeable to session. Spouse present throughout and very supportive. Pt agreeable to session and OOB activity. I don't want to go home until after I have my cardiac cath. Pt was able to lateral transfer form EOB to recliner without physical assistance. He then proceeded to stand 1 x from recliner to RW with CGA for safety with vcs for technique improvements. Overall pt tolerated session well. He was educated on importance of continuing to perform ther ex throughout the day to prevent weakness. DC recs remain appropriate. Acute PT will continue to follow per current POC.    If plan is discharge home, recommend the following: A little help with walking and/or transfers;A little help with bathing/dressing/bathroom;Assistance with cooking/housework;Help with stairs or ramp for  entrance;Assist for transportation     Equipment Recommendations  None recommended by PT       Precautions / Restrictions Precautions Precautions: Fall Recall of Precautions/Restrictions: Intact Restrictions Weight Bearing Restrictions Per Provider Order: Yes     Mobility  Bed Mobility Overal bed mobility: Modified Independent   Transfers Overall transfer level: Needs assistance Equipment used: Rolling walker (2 wheels) Transfers: Sit to/from Stand, Bed to chair/wheelchair/BSC Sit to Stand: Contact guard assist, Supervision (from recliner)  General transfer comment: pt performed lateral scoot form EOB to recliner without physical assistance. He then stood 1 x form recliner to RW with CGA. PRAFO  applied throughotu session    Ambulation/Gait  General Gait Details: N/A per orders, pt is not to ambulate to allow proper foot healing    Balance Overall balance assessment: Needs assistance Sitting-balance support: Feet supported Sitting balance-Leahy Scale: Good     Standing balance support: Bilateral upper extremity supported, During functional activity, Reliant on assistive device for balance Standing balance-Leahy Scale: Fair      Hotel manager: No apparent difficulties  Cognition Arousal: Alert Behavior During Therapy: WFL for tasks assessed/performed   PT - Cognitive impairments: No apparent impairments   PT - Cognition Comments: Pt is A and O x 3. supportive spouse at bedside. Both still planning to return home at DC Following commands: Intact      Cueing Cueing Techniques: Verbal cues, Visual cues     General Comments General comments (skin integrity, edema, etc.): Author educated on importance of continueing routine ther ex to prevent weakness while decreasing caregiver needs      Pertinent Vitals/Pain Pain Assessment Pain Assessment: No/denies pain Pain Score:  0-No pain Pain Descriptors / Indicators: Sore Pain Intervention(s):  Limited activity within patient's tolerance, Monitored during session, Repositioned, Premedicated before session     PT Goals (current goals can now be found in the care plan section) Acute Rehab PT Goals Patient Stated Goal: go home Progress towards PT goals: Progressing toward goals    Frequency    Min 2X/week       AM-PAC PT 6 Clicks Mobility   Outcome Measure  Help needed turning from your back to your side while in a flat bed without using bedrails?: A Little Help needed moving from lying on your back to sitting on the side of a flat bed without using bedrails?: A Little Help needed moving to and from a bed to a chair (including a wheelchair)?: A Little Help needed standing up from a chair using your arms (e.g., wheelchair or bedside chair)?: A Little Help needed to walk in hospital room?: A Little Help needed climbing 3-5 steps with a railing? : A Little 6 Click Score: 18    End of Session   Activity Tolerance: Patient tolerated treatment well Patient left: in chair;with call bell/phone within reach;with chair alarm set Nurse Communication: Mobility status;Weight bearing status PT Visit Diagnosis: Unsteadiness on feet (R26.81);Difficulty in walking, not elsewhere classified (R26.2);Muscle weakness (generalized) (M62.81)     Time: 9166-9141 PT Time Calculation (min) (ACUTE ONLY): 25 min  Charges:    $Therapeutic Exercise: 8-22 mins $Therapeutic Activity: 8-22 mins PT General Charges $$ ACUTE PT VISIT: 1 Visit                    Rankin Essex PTA 11/15/23, 12:26 PM

## 2023-11-15 NOTE — Progress Notes (Signed)
 Occupational Therapy Treatment Patient Details Name: [name] MRN: [id number] DOB: 1945-11-29 Today's Date: 11/15/2023   History of present illness Pt is a 78 y.o. male with L foot cellulitis s/p recent debridement with Dr. Gunnar at [facility] who was admitted recently to inpatient psychiatric unit with suicide ideation. He was involuntarily committed after suicide attempt for which he took 10 to 60 tablets of tramadol. Rapid response was called on 9/6 when he became hypoxic and pt was transferred to acute medical care for management of the suspected aspiration pneumonia. MD assessment also includes NSTEMI.  PMH of adrenal insufficiency, CKD3, heart failure with reduced ejection fraction EF of 45 to 50%, CAD with lesion to LAD, PAD complex stenting done recently 4 weeks ago, history of beta-blockers insulin -dependent diabetes, chronic hearing loss, HLD, hypothyroidism, iron  deficiency anemia, lumbar degenerative disc disease with radiculopathy, chronic eczema, history of Charcot foot of the left, history of diabetic neuropathy, HTN, OSA, and sarcoidosis   OT comments  Pt seen for OT session this date focusing on seated ADL performance. Pt impulsive during session, requiring cues for redirection as pt attempts to don sock in bed while tangled in bed sheet, experiencing loss of balance backwards while in bed requiring MIN A to correct. Once seated statically at EOB, pt is able to perform seated figure four to don R sock and grooming tasks with setup. Pt reports having necessary DME at home and has been practicing functional SPT transfers with L PRAFO boot donned at all times, declines further mobility or ADL performance as he recently returned to bed. Discharge recommendation appropriate, OT will continue to follow.       If plan is discharge home, recommend the following:  A little help with walking and/or transfers;A little help with bathing/dressing/bathroom;Assistance with  cooking/housework;Assist for transportation;Help with stairs or ramp for entrance   Equipment Recommendations  None recommended by OT       Precautions / Restrictions Precautions Precautions: Fall Recall of Precautions/Restrictions: Intact Restrictions Weight Bearing Restrictions Per Provider Order: Yes Other Position/Activity Restrictions: Per orders ok for pt to stand on LLE but walking not recommended; pt stated that podiatry instructed pt to have boot donned to L foot at all times       Mobility Bed Mobility Overal bed mobility: Modified Independent             General bed mobility comments: Min increased time and effort only    Transfers Overall transfer level: Needs assistance                 General transfer comment: lateral scoot transfer towards EOB without assist; PRAFO boot donned LLE throughout     Balance Overall balance assessment: Needs assistance Sitting-balance support: Feet supported Sitting balance-Leahy Scale: Good Sitting balance - Comments: supervision for sitting balance                                   ADL either performed or assessed with clinical judgement   ADL Overall ADL's : Needs assistance/impaired     Grooming: Wash/dry hands;Wash/dry face;Sitting Grooming Details (indicate cue type and reason): supervision for seated balance, setup for grooming tasks             Lower Body Dressing: Sitting/lateral leans;Minimal assistance Lower Body Dressing Details (indicate cue type and reason): pt attempts to don R sock before scooting forward to EOB, looses balance posteriorally, requiring MIN  A to correct, pt tangled in bedsheets/does not wait for therapist to setup environment for success, min vcs to redirect. once pt is scooted forward to sit at EOB and balance stable, pt is able to perform donning R sock using seated figure for               General ADL Comments: seated EOB ADL performance      Communication Communication Communication: No apparent difficulties   Cognition Arousal: Alert Behavior During Therapy: WFL for tasks assessed/performed               OT - Cognition Comments: poor safety awareness and insight into deficits                 Following commands: Intact        Cueing   Cueing Techniques: Verbal cues, Visual cues        General Comments Boot donned on LLE throughout session    Pertinent Vitals/ Pain       Pain Assessment Pain Assessment: No/denies pain Pain Score: 0-No pain   Frequency  Min 2X/week        Progress Toward Goals  OT Goals(current goals can now be found in the care plan section)  Progress towards OT goals: Progressing toward goals  Acute Rehab OT Goals OT Goal Formulation: With patient Time For Goal Achievement: 11/27/23 Potential to Achieve Goals: Good ADL Goals Pt Will Perform Lower Body Bathing: with modified independence;sitting/lateral leans;sit to/from stand Pt Will Perform Lower Body Dressing: with modified independence;sit to/from stand;sitting/lateral leans Pt Will Transfer to Toilet: with modified independence;stand pivot transfer  Plan      AM-PAC OT 6 Clicks Daily Activity     Outcome Measure   Help from another person eating meals?: None Help from another person taking care of personal grooming?: None Help from another person toileting, which includes using toliet, bedpan, or urinal?: A Little Help from another person bathing (including washing, rinsing, drying)?: A Little Help from another person to put on and taking off regular upper body clothing?: None Help from another person to put on and taking off regular lower body clothing?: A Little 6 Click Score: 21    End of Session    OT Visit Diagnosis: Other abnormalities of gait and mobility (R26.89);Muscle weakness (generalized) (M62.81)   Activity Tolerance Patient tolerated treatment well   Patient Left in bed;with call  bell/phone within reach;with bed alarm set;with nursing/sitter in room   Nurse Communication Mobility status        Time: 8551-8494 OT Time Calculation (min): 17 min  Charges: OT General Charges $OT Visit: 1 Visit OT Treatments $Self Care/Home Management : 8-22 mins  Aariona Momon L. Judyth Demarais, OTR/L  11/15/23, 3:15 PM

## 2023-11-15 NOTE — Inpatient Diabetes Management (Signed)
 Inpatient Diabetes Program Recommendations  AACE/ADA: New Consensus Statement on Inpatient Glycemic Control   Target Ranges:  Prepandial:   less than 140 mg/dL      Peak postprandial:   less than 180 mg/dL (1-2 hours)      Critically ill patients:  140 - 180 mg/dL    Latest Reference Range & Units 11/14/23 08:31 11/14/23 11:22 11/14/23 16:52 11/14/23 19:26 11/14/23 23:35 11/15/23 03:44 11/15/23 07:42  Glucose-Capillary 70 - 99 mg/dL 804 (H) 755 (H) 690 (H) 297 (H) 209 (H) 195 (H) 320 (H)   Review of Glycemic Control  Diabetes history: DM2 Outpatient Diabetes medications: 70/30 30-40 units (frequency depends on how many times he eats a day); Prednisone  10 mg QHS  Current orders for Inpatient glycemic control: 70/30 20 units BID; Prednisone  10 mg QHS   Inpatient Diabetes Program Recommendations:    Insulin : CBG have ranged from 195-320 mg/dl over past 24 hours; CBG 320 mg/dl this morning.  Please consider increasing 70/30 to 30 units BID and adding back SSI Novolog  0-15 units TID with meals.  Thanks, Earnie Gainer, RN, MSN, CDCES Diabetes Coordinator Inpatient Diabetes Program [phone] (Team Pager from 8am to 5pm)

## 2023-11-15 NOTE — Progress Notes (Signed)
 Heart Failure Stewardship Pharmacy Note  PCP: Arloa Elsie SAUNDERS, MD PCP-Cardiologist: Oneil Parchment, MD  HPI: [name] is a 78 y.o. male with adrenal insufficiency, chronic kidney disease stage III, heart failure with reduced ejection fraction, coronary artery disease, PAD, insulin -dependent diabetes, chronic hearing loss, hyperlipidemia, hypothyroidism, iron  deficiency anemia, lumbar degenerative disc disease with radiculopathy, chronic eczema, history of Charcot foot of the left, history of diabetic neuropathy, obstructive sleep apnea and sarcoidosis, essential hypertension who presented with suicidal ideation. On admission, BNP was 838.2, AM cortisol 13.7, UDS negative, lactate 1.4, HS-troponin was 1170 > 992 > 753, and TSH 3.163. On 11/10/23, patient became hypoxic and experienced vomiting. Chest x-ray noted bilateral lower lobe airspace opacities, right greater than left concerning for pneumonia suspected to be aspiration PNA.   Pertinent cardiac history: CAD with DES to LAD and PLA in 2011, DESx2 right posterior dedscending artery in 08/2018, DES to pRCA mRCA and 1st diag in 01/2021. Most recent LHC 08/2021 at [facility] showed nonobstructive CAD and no interventions were performed. TTE 11/2023 with newly reduced LVEF to 35-40% with G1DD.   Pertinent Lab Values: Creat  Date Value Ref Range Status  01/09/2020 2.17 (H) 0.70 - 1.18 mg/dL Final    Comment:    For patients >74 years of age, the reference limit for Creatinine is approximately 13% higher for people identified as African-American. .    Creatinine, Ser  Date Value Ref Range Status  11/15/2023 1.08 0.61 - 1.24 mg/dL Final   BUN  Date Value Ref Range Status  11/15/2023 22 8 - 23 mg/dL Final  89/75/7976 19 8 - 27 mg/dL Final   Potassium  Date Value Ref Range Status  11/15/2023 4.1 3.5 - 5.1 mmol/L Final   Sodium  Date Value Ref Range Status  11/15/2023 141 135 - 145 mmol/L Final  12/28/2021 142 134 - 144 mmol/L Final   B  Natriuretic Peptide  Date Value Ref Range Status  11/11/2023 838.2 (H) 0.0 - 100.0 pg/mL Final    Comment:    Performed at [facility], [address] [place], [place] [address]   Magnesium   Date Value Ref Range Status  11/14/2023 1.9 1.7 - 2.4 mg/dL Final    Comment:    Performed at [facility], [address] [place], [place] [address]   Hgb A1c MFr Bld  Date Value Ref Range Status  10/09/2023 10.6 (H) 4.8 - 5.6 % Final    Comment:    (NOTE)         Prediabetes: 5.7 - 6.4         Diabetes: >6.4         Glycemic control for adults with diabetes: <7.0    TSH  Date Value Ref Range Status  11/11/2023 3.163 0.350 - 4.500 uIU/mL Final    Comment:    Performed by a 3rd Generation assay with a functional sensitivity of <=0.01 uIU/mL. Performed at [facility], [address] [place], [place] [address]   07/27/2018 6.200 (H) 0.450 - 4.500 uIU/mL Final   LDH  Date Value Ref Range Status  11/13/2019 181 98 - 192 U/L Final    Comment:    Performed at [facility], [address], [place], [place] [address]    Vital Signs:  Temp:  [97.8 F (36.6 C)-98.6 F (37 C)] 98.6 F (37 C) (09/10 0343) Pulse Rate:  [58-71] 71 (09/10 0343) Cardiac Rhythm: Normal sinus rhythm;Bundle branch block (09/09 1900)  Resp:  [16-20] 20 (09/10 0343) BP: (128-151)/(56-66) 151/56 (09/10 0343) SpO2:  [95 %-96 %] 96 % (09/10 0343)  Intake/Output Summary (Last 24 hours) at 11/15/2023 0737 Last data filed at 11/15/2023 0345 Gross per 24 hour  Intake 720 ml  Output 200 ml  Net 520 ml   Current Heart Failure Medications:  Loop diuretic: none Beta-Blocker: metoprolol  succinate 25 mg daily ACEI/ARB/ARNI: Entresto  24-26 mg BID MRA: none SGLT2i: none Other: hydralazine  10 mg BID  Prior to admission Heart Failure Medications:  Loop diuretic: furosemide  80 mg prn Beta-Blocker: none ACEI/ARB/ARNI: losartan  100 mg daily MRA: none SGLT2i:  none Other: hydralazine  10 mg BID, amlodipine   Assessment: 1. Acute combined systolic and diastolic heart failure (LVEF 35-40%) with G1DD, due to ICM. NYHA class II-III symptoms.  -Symptoms: Reports feeling well overall. Denies any symptoms. Patient attempted to discuss catheterization, but this conversation was deferred to cardiology. -Volume: Appears to be euvolemic. Creatinine has returned t baseline. Patient voiced frustration with past use of diuretics. No indication for loop diuretics at this time.  -Hemodynamics: BP is elevated today and HR 70-80s.  -BB: Continue metoprolol  succinate 25 mg daily.   -ACEI/ARB/ARNI: Currently on Entresto  24-26 mg BID. Would consider increasing if BP stable after adding spironolactone  and Jardiance. -MRA: Can consider adding spironolactone  12.5 mg today now that creatinine is improved and K stable. -SGLT2i: WBC trending down after steroid decrease. Would consider adding Jardiance as Jardiance is significantly cheaper for this patient than [place]. -PTA amlodipine  stopped. Would not resume at discharge.  Plan: 1) Medication changes recommended at this time: - Consider starting Jardiance 10 mg daily - Consider adding spironolactone  12.5 mg daily tomorrow if renal function is stable  2) Patient assistance: -Generic Entresto  copay is $47.00, Doreen copay is $253.82, Jardiance copay is $47.00   3) Education: - Patient has been educated on current HF medications and potential additions to HF medication regimen - Patient verbalizes understanding that over the next few months, these medication doses may change and more medications may be added to optimize HF regimen - Patient has been educated on basic disease state pathophysiology and goals of therapy  Medication Assistance / Insurance Benefits Check: Does the patient have prescription insurance? Prescription Insurance: Medicare  Type of insurance plan:  Does the patient qualify for medication assistance  through manufacturers or grants? Pending   Outpatient Pharmacy: Prior to admission outpatient pharmacy[facility]      Please do not hesitate to reach out with questions or concerns,  Jaun Bash, PharmD, CPP, BCPS, [facility] Heart Failure Pharmacist  Phone - [phone] 11/15/2023 7:37 AM

## 2023-11-15 NOTE — Progress Notes (Addendum)
 Progress Note    TAHSIN [name]  FMW:[id number] DOB: [birth year]  DOA: 11/11/2023 PCP: Arloa Elsie SAUNDERS, MD      Brief Narrative:    Medical records reviewed and are as summarized below:  [name] is a 78 y.o. male with medical history significant of adrenal insufficiency, chronic kidney disease stage III, heart failure with midrange ejection fraction EF of 45 to 50%, coronary artery disease with lesion to LAD, PAD complex stenting done recently 4 weeks prior to admission, history of beta-blockers insulin -dependent diabetes, chronic hearing loss, hyperlipidemia, hypothyroidism, iron  deficiency anemia, lumbar degenerative disc disease with radiculopathy, chronic eczema, history of Charcot foot of the left, history of diabetic neuropathy, obstructive sleep apnea, sarcoidosis, essential hypertension.  He presented to [facility] ED on 11/09/2023 and was subsequently admitted to [facility] behavioral health unit on 11/11/2023 because of suicidal ideation.  Apparently, he had taken about 10 to 60 tablets of tramadol.  He was placed under involuntary commitment.   On 11/10/2023, rapid response was called at the [facility] psychiatric unit because of hypoxia and fever.  He was found to have pneumonia.       Assessment/Plan:   Principal Problem:   Aspiration pneumonia (HCC) Active Problems:   Sarcoidosis   Coronary artery disease with exertional angina (HCC)   GERD   OSA (obstructive sleep apnea)   History of adrenal insufficiency   Pulmonary sarcoidosis (HCC)   Uncontrolled type 2 diabetes mellitus with hyperglycemia, with long-term current use of insulin  (HCC)   Hyperlipidemia   Stage 3a chronic kidney disease (CKD) (HCC)   Suicidal behavior with attempted self-injury (HCC)   Non-ST elevation (NSTEMI) myocardial infarction (HCC)   HFrEF (heart failure with reduced ejection fraction) (HCC)   Pressure injury of skin   Elevated troponin   Cardiomyopathy (HCC)   Reported suicidal attempt  versus unintentional overdose: Apparently, he had taken about 10 to 60 tablets of tramadol prior to admission. He has been cleared by psychiatrist for discharge.   Pneumonia, probable aspiration: Chest x-ray with bilateral lower airspace opacities.  Continue Levaquin .  Plan to complete antibiotics today.   Acute NSTEMI, history of CAD and PVD: Troponin 1,170, 992, 753. He has decided to proceed with left heart cath. Continue aspirin , Plavix  and statin.  Follow-up with cardiologist. S/p treatment with IV heparin  drip Recent stent to left lower extremity on 10/17/2023 at [facility] health and was discharged on aspirin  and Plavix  Recent left foot wound debridement for diabetic foot ulcer on 10/18/2023 and has completed Keflex.   HFrEF, probably acute: 2D echo showed EF estimated at 35 to 45%. Continue Entresto  spironolactone  Previous 2D echo in August 2025 showed EF estimated at 45 to 50%. History of chronic HFpEF   Type II DM with hyperglycemia: Glucose up to 411.  Increase NovoLog  Mix 70/30 from 20 units to 30 units 3 times daily.  Add sliding scale insulin .  Monitor glucose levels closely. He said he takes 25 to 40 units of NovoLog  mix 2 times or 3 times a day depending on his glucose levels and how many times he eats in a day.   S/p mechanical fall on 11/13/2023.  CT head negative. PT recommended home health therapy.   Comorbidities include adrenal insufficiency and sarcoidosis on prednisone , CKD stage IIIa, hypertension, hyperlipidemia, normocytic anemia, hypothyroidism     Diet Order             Diet heart healthy/carb modified Room service appropriate? Yes; Fluid consistency:  Thin  Diet effective now                                  Consultants: Neurologist Psychiatrist  Procedures: None    Medications:    aspirin  EC  81 mg Oral Daily   clopidogrel   75 mg Oral Daily   ezetimibe   10 mg Oral Daily   Gerhardt's butt cream   Topical BID    guaiFENesin   600 mg Oral BID   hydrALAZINE   10 mg Oral BID   insulin  aspart  0-15 Units Subcutaneous TID WC   insulin  aspart protamine- aspart  30 Units Subcutaneous BID WC   isosorbide  mononitrate  30 mg Oral Daily   levothyroxine   50 mcg Oral Q0600   metoprolol  succinate  25 mg Oral Daily   pantoprazole  (PROTONIX ) IV  40 mg Intravenous QHS   predniSONE   10 mg Oral QHS   rosuvastatin   5 mg Oral QHS   sacubitril -valsartan   1 tablet Oral BID   spironolactone   12.5 mg Oral Daily   Continuous Infusions:  levofloxacin  (LEVAQUIN ) IV 500 mg (11/14/23 2105)   promethazine  (PHENERGAN ) injection (IM or IVPB) Stopped (11/14/23 0459)     Anti-infectives (From admission, onward)    Start     Dose/Rate Route Frequency Ordered Stop   11/13/23 2200  levofloxacin  (LEVAQUIN ) IVPB 500 mg        500 mg 100 mL/hr over 60 Minutes Intravenous Every 24 hours 11/13/23 0737 11/16/23 2159   11/11/23 2200  doxycycline  (VIBRA -TABS) tablet 100 mg  Status:  Discontinued        100 mg Oral Every 12 hours 11/11/23 1927 11/11/23 1959   11/11/23 2200  cefTRIAXone  (ROCEPHIN ) 2 g in sodium chloride  0.9 % 100 mL IVPB  Status:  Discontinued        2 g 200 mL/hr over 30 Minutes Intravenous Every 24 hours 11/11/23 1953 11/11/23 2007   11/11/23 2200  doxycycline  (VIBRAMYCIN ) 100 mg in sodium chloride  0.9 % 250 mL IVPB  Status:  Discontinued        100 mg 125 mL/hr over 120 Minutes Intravenous Every 12 hours 11/11/23 1957 11/11/23 2130   11/11/23 2200  levofloxacin  (LEVAQUIN ) IVPB 750 mg  Status:  Discontinued        750 mg 100 mL/hr over 90 Minutes Intravenous Every 24 hours 11/11/23 2015 11/13/23 0737   11/11/23 2100  Ampicillin -Sulbactam (UNASYN ) 3 g in sodium chloride  0.9 % 100 mL IVPB  Status:  Discontinued        3 g 200 mL/hr over 30 Minutes Intravenous Every 6 hours 11/11/23 2007 11/11/23 2130   11/11/23 2015  cefTRIAXone  (ROCEPHIN ) 1 g in sodium chloride  0.9 % 100 mL IVPB  Status:  Discontinued        1  g 200 mL/hr over 30 Minutes Intravenous Every 24 hours 11/11/23 1927 11/11/23 1955              Family Communication/Anticipated D/C date and plan/Code Status   DVT prophylaxis:      Code Status: Full Code  Family Communication: Plan discussed with his wife at the bedside Disposition Plan: Plan to discharge home   Status is: Inpatient Remains inpatient appropriate because: NSTEMI, pneumonia       Subjective:   Interval events noted.  He complains of shortness of breath even at rest.  He does not move around much.  Objective:  Vitals:   11/14/23 0858 11/14/23 1923 11/15/23 0343 11/15/23 0739  BP: 132/66 (!) 128/59 (!) 151/56 (!) 140/77  Pulse: (!) 58 66 71 82  Resp:  16 20 16   Temp: 97.8 F (36.6 C) 98.4 F (36.9 C) 98.6 F (37 C) 98.1 F (36.7 C)  TempSrc: Oral Oral Oral Oral  SpO2: 95% 95% 96% 97%   No data found.   Intake/Output Summary (Last 24 hours) at 11/15/2023 1234 Last data filed at 11/15/2023 1008 Gross per 24 hour  Intake 960 ml  Output 200 ml  Net 760 ml   There were no vitals filed for this visit.  Exam:  GEN: NAD SKIN: Warm and dry EYES: No pallor or icterus ENT: MMM CV: RRR PULM: CTA B ABD: soft, ND, NT, +BS CNS: AAO x 3, non focal EXT: Left foot wound.  Mild left foot edema.  No tenderness      Data Reviewed:   I have personally reviewed following labs and imaging studies:  Labs: Labs show the following:   Basic Metabolic Panel: Recent Labs  Lab 11/11/23 2016 11/12/23 0434 11/12/23 1939 11/13/23 0321 11/14/23 0319 11/15/23 0326  NA 139 139  --  131* 136 141  K 3.7 3.8  --  3.5 3.2* 4.1  CL 97* 96*  --  91* 96* 103  CO2 33* 32  --  31 30 29   GLUCOSE 261* 237*  --  194* 147* 204*  BUN 18 16  --  26* 31* 22  CREATININE 1.38* 1.33*  --  1.52* 1.41* 1.08  CALCIUM  8.4* 8.4*  --  8.1* 7.7* 8.0*  MG 1.7 2.0  --  2.0 1.9  --   PHOS  --  1.2* 2.5 2.3* 2.8  --    GFR Estimated Creatinine Clearance: 64.9  mL/min (by C-G formula based on SCr of 1.08 mg/dL). Liver Function Tests: Recent Labs  Lab 11/09/23 1650 11/10/23 1934 11/11/23 2016 11/12/23 0434  AST 46* 28 29 28   ALT 29 20 18 20   ALKPHOS 62 56 66 69  BILITOT 1.2 1.2 1.2 1.4*  PROT 7.1 6.7 6.8 7.0  ALBUMIN 3.5 3.4* 3.0* 3.0*   No results for input(s): LIPASE, AMYLASE in the last 168 hours. No results for input(s): AMMONIA in the last 168 hours. Coagulation profile No results for input(s): INR, PROTIME in the last 168 hours.  CBC: Recent Labs  Lab 11/09/23 1650 11/10/23 1934 11/11/23 0749 11/12/23 0434 11/13/23 0321 11/14/23 0319 11/15/23 0326  WBC 12.6*   < > 14.3* 14.3* 19.3* 12.5* 10.8*  NEUTROABS 9.8*  --   --   --   --   --   --   HGB 13.9   < > 12.6* 12.9* 12.2* 11.9* 13.1  HCT 42.4   < > 40.7 41.0 37.1* 35.0* 39.5  MCV 99.1   < > 102.3* 102.5* 99.2 96.7 97.3  PLT 331   < > 267 269 249 245 245   < > = values in this interval not displayed.   Cardiac Enzymes: Recent Labs  Lab 11/11/23 2159  CKTOTAL 72   BNP (last 3 results) No results for input(s): PROBNP in the last 8760 hours. CBG: Recent Labs  Lab 11/14/23 1926 11/14/23 2335 11/15/23 0344 11/15/23 0742 11/15/23 1136  GLUCAP 297* 209* 195* 320* 411*   D-Dimer: No results for input(s): DDIMER in the last 72 hours. Hgb A1c: No results for input(s): HGBA1C in the last 72 hours. Lipid Profile: Recent Labs  11/13/23 0321  CHOL 165  HDL 34*  LDLCALC 108*  TRIG 117  CHOLHDL 4.9   Thyroid  function studies: No results for input(s): TSH, T4TOTAL, T3FREE, THYROIDAB in the last 72 hours.  Invalid input(s): FREET3 Anemia work up: No results for input(s): VITAMINB12, FOLATE, FERRITIN, TIBC, IRON , RETICCTPCT in the last 72 hours. Sepsis Labs: Recent Labs  Lab 11/11/23 2016 11/11/23 2244 11/12/23 0434 11/13/23 0321 11/14/23 0319 11/15/23 0326  WBC  --   --  14.3* 19.3* 12.5* 10.8*  LATICACIDVEN 1.4  1.2  --   --   --   --     Microbiology Recent Results (from the past 240 hours)  Resp panel by RT-PCR (RSV, Flu A&B, Covid) Anterior Nasal Swab     Status: None   Collection Time: 11/10/23 10:55 AM   Specimen: Anterior Nasal Swab  Result Value Ref Range Status   SARS Coronavirus 2 by RT PCR NEGATIVE NEGATIVE Final   Influenza A by PCR NEGATIVE NEGATIVE Final   Influenza B by PCR NEGATIVE NEGATIVE Final    Comment: (NOTE) The Xpert Xpress SARS-CoV-2/FLU/RSV plus assay is intended as an aid in the diagnosis of influenza from Nasopharyngeal swab specimens and should not be used as a sole basis for treatment. Nasal washings and aspirates are unacceptable for Xpert Xpress SARS-CoV-2/FLU/RSV testing.  Fact Sheet for Patients: BloggerCourse.com  Fact Sheet for Healthcare Providers: SeriousBroker.it  This test is not yet approved or cleared by the [place]  FDA and has been authorized for detection and/or diagnosis of SARS-CoV-2 by FDA under an Emergency Use Authorization (EUA). This EUA will remain in effect (meaning this test can be used) for the duration of the COVID-19 declaration under Section 564(b)(1) of the Act, 21 U.S.C. section 360bbb-3(b)(1), unless the authorization is terminated or revoked.     Resp Syncytial Virus by PCR NEGATIVE NEGATIVE Final    Comment: (NOTE) Fact Sheet for Patients: BloggerCourse.com  Fact Sheet for Healthcare Providers: SeriousBroker.it  This test is not yet approved or cleared by the [place]  FDA and has been authorized for detection and/or diagnosis of SARS-CoV-2 by FDA under an Emergency Use Authorization (EUA). This EUA will remain in effect (meaning this test can be used) for the duration of the COVID-19 declaration under Section 564(b)(1) of the Act, 21 U.S.C. section 360bbb-3(b)(1), unless the authorization is terminated  or revoked.  Performed at [facility], [address] [place], [place] [address]     Procedures and diagnostic studies:  CT HEAD WO CONTRAST ( ) Result Date: 11/13/2023 EXAM: CT HEAD WITHOUT CONTRAST 11/13/2023 06:45:29 PM TECHNIQUE: CT of the head was performed without the administration of intravenous contrast. Automated exposure control, iterative reconstruction, and/or weight based adjustment of the mA/kV was utilized to reduce the radiation dose to as low as reasonably achievable. COMPARISON: CT head 11/11/23 CLINICAL HISTORY: Fall, head injury on Heparin  gtt. FINDINGS: BRAIN AND VENTRICLES: No acute hemorrhage. No evidence of acute infarct. No hydrocephalus. No extra-axial collection. No mass effect or midline shift. Nonspecific hypoattenuation in the periventricular and subcortical white matter, most likely representing chronic small vessel disease. ORBITS: Bilateral lens replacement. SINUSES: Small bilateral mastoid effusions similar to prior. Mucosal thickening in the right sphenoid sinus slightly increased from prior. SOFT TISSUES AND SKULL: No acute soft tissue abnormality. No skull fracture. VASCULATURE: Atherosclerosis involving the carotid siphons and intracranial vertebral arteries. IMPRESSION: 1. No acute intracranial abnormality related to the fall and head injury. 2. Mild chronic small vessel disease. 3.  Mucosal thickening in the right sphenoid sinus, slightly increased from prior. Electronically signed by: Donnice Mania MD 11/13/2023 09:11 PM EDT RP Workstation: [id number]               LOS: 4 days   Isobelle Tuckett  [facility]   Pager on [email or web address]. If 7PM-7AM, please contact night-coverage at [email or web address]     11/15/2023, 12:34 PM

## 2023-11-15 NOTE — Consult Note (Signed)
 WOC Nurse Consult Note: Reason for Consult:moisture associated skin damage to scrotum  History debridement left foot by podiatry.  Current treatment:   - Apply prescribed cream (SioxC) twice daily, covering all areas except the incision site. - Wear clean cotton socks. - Continue using the boot for support.  Wound type: MASD to scrotum Pressure Injury POA: NA Measurement: 1cm x 1.5 cm x 0.1 cm lesion to posterior scrotum.  No edema noted.  Appears euvolemic throughout.  Wound bed: pink and moist  Drainage (amount, consistency, odor) scant weeping Periwound: intact Dressing procedure/placement/frequency: Cleanse perineal and scrotal area with soap and water  and pat dry. Apply Gerhardts paste to scrotum twice daily and PRN soilage. May cover scrotum with vaseline gauze and kerlix if needed.  Will not follow at this time.  Please re-consult if needed.  Darice Cooley MSN, RN, FNP-BC CWON Wound, Ostomy, Continence Nurse Outpatient [facility] [phone] Work cell phone:  [phone]

## 2023-11-16 ENCOUNTER — Inpatient Hospital Stay

## 2023-11-16 DIAGNOSIS — J690 Pneumonitis due to inhalation of food and vomit: Secondary | ICD-10-CM | POA: Diagnosis not present

## 2023-11-16 DIAGNOSIS — I214 Non-ST elevation (NSTEMI) myocardial infarction: Secondary | ICD-10-CM | POA: Diagnosis not present

## 2023-11-16 LAB — GLUCOSE, CAPILLARY
Glucose-Capillary: 125 mg/dL — ABNORMAL HIGH (ref 70–99)
Glucose-Capillary: 188 mg/dL — ABNORMAL HIGH (ref 70–99)
Glucose-Capillary: 255 mg/dL — ABNORMAL HIGH (ref 70–99)
Glucose-Capillary: 265 mg/dL — ABNORMAL HIGH (ref 70–99)
Glucose-Capillary: 284 mg/dL — ABNORMAL HIGH (ref 70–99)
Glucose-Capillary: 51 mg/dL — ABNORMAL LOW (ref 70–99)
Glucose-Capillary: 95 mg/dL (ref 70–99)

## 2023-11-16 MED ORDER — FLEET ENEMA RE ENEM
1.0000 | ENEMA | Freq: Once | RECTAL | Status: AC
  Administered 2023-11-16: 1 via RECTAL

## 2023-11-16 MED ORDER — PANTOPRAZOLE SODIUM 40 MG PO TBEC
40.0000 mg | DELAYED_RELEASE_TABLET | Freq: Every day | ORAL | Status: DC
  Administered 2023-11-16 – 2023-11-17 (×2): 40 mg via ORAL
  Filled 2023-11-16 (×2): qty 1

## 2023-11-16 MED ORDER — BISACODYL 10 MG RE SUPP
10.0000 mg | Freq: Once | RECTAL | Status: AC
  Administered 2023-11-16: 10 mg via RECTAL
  Filled 2023-11-16: qty 1

## 2023-11-16 MED ORDER — SENNOSIDES-DOCUSATE SODIUM 8.6-50 MG PO TABS
1.0000 | ORAL_TABLET | Freq: Every day | ORAL | Status: DC
  Administered 2023-11-17: 1 via ORAL
  Filled 2023-11-16 (×2): qty 1

## 2023-11-16 MED ORDER — INSULIN ASPART PROT & ASPART (70-30 MIX) 100 UNIT/ML ~~LOC~~ SUSP
25.0000 [IU] | Freq: Two times a day (BID) | SUBCUTANEOUS | Status: DC
Start: 2023-11-16 — End: 2023-11-17
  Administered 2023-11-16: 25 [IU] via SUBCUTANEOUS

## 2023-11-16 NOTE — Progress Notes (Addendum)
 Progress Note    [name]  FMW:[id number] DOB: [birth year]  DOA: 11/11/2023 PCP: Arloa Elsie SAUNDERS, MD      Brief Narrative:    Medical records reviewed and are as summarized below:  [name] is a 78 y.o. male with medical history significant of adrenal insufficiency, chronic kidney disease stage III, heart failure with midrange ejection fraction EF of 45 to 50%, coronary artery disease with lesion to LAD, PAD complex stenting done recently 4 weeks prior to admission, history of beta-blockers insulin -dependent diabetes, chronic hearing loss, hyperlipidemia, hypothyroidism, iron  deficiency anemia, lumbar degenerative disc disease with radiculopathy, chronic eczema, history of Charcot foot of the left, history of diabetic neuropathy, obstructive sleep apnea, sarcoidosis, essential hypertension.  He presented to [facility] ED on 11/09/2023 and was subsequently admitted to [facility] behavioral health unit on 11/11/2023 because of suicidal ideation.  Apparently, he had taken about 10 to 60 tablets of tramadol.  He was placed under involuntary commitment.   On 11/10/2023, rapid response was called at the [facility] psychiatric unit because of hypoxia and fever.  He was found to have pneumonia.       Assessment/Plan:   Principal Problem:   Aspiration pneumonia (HCC) Active Problems:   Sarcoidosis   Coronary artery disease with exertional angina (HCC)   GERD   OSA (obstructive sleep apnea)   History of adrenal insufficiency   Pulmonary sarcoidosis (HCC)   Uncontrolled type 2 diabetes mellitus with hyperglycemia, with long-term current use of insulin  (HCC)   Hyperlipidemia   Stage 3a chronic kidney disease (CKD) (HCC)   Suicidal behavior with attempted self-injury (HCC)   Non-ST elevation (NSTEMI) myocardial infarction (HCC)   HFrEF (heart failure with reduced ejection fraction) (HCC)   Pressure injury of skin   Elevated troponin   Cardiomyopathy (HCC)   Reported suicidal attempt  versus unintentional overdose: Apparently, he had taken about 10 to 60 tablets of tramadol prior to admission. He has been cleared by psychiatrist for discharge.   Pneumonia, probable aspiration: Chest x-ray with bilateral lower airspace opacities.  Completed Levaquin  on 11/15/2023.     Acute NSTEMI, history of CAD and PVD: Troponin 1,170, 992, 753. Plan for left heart cath tomorrow. Continue aspirin , Plavix  and statin.  Follow-up with cardiologist. S/p treatment with IV heparin  drip Recent stent to left lower extremity on 10/17/2023 at [facility] health and was discharged on aspirin  and Plavix  Recent left foot wound debridement for diabetic foot ulcer on 10/18/2023 and has completed Keflex.   HFrEF, probably acute: 2D echo showed EF estimated at 35 to 45%. Continue Entresto  spironolactone  Previous 2D echo in August 2025 showed EF estimated at 45 to 50%. History of chronic HFpEF   Type II DM with hyperglycemia, hypoglycemia: Glucose dropped to 51 overnight.  Decrease NovoLog  Mix 70/30 from 30 units to 25 units twice daily.  Continue NovoLog  sliding scale.  Monitor glucose closely and adjust insulin  accordingly. He said he takes 25 to 40 units of NovoLog  mix 2 times or 3 times a day depending on his glucose levels and how many times he eats in a day.   Constipation: No relief with Dulcolax suppository.  He he requested an enema which has been ordered.   S/p mechanical fall on 11/13/2023.  CT head negative. PT recommended home health therapy.   Comorbidities include adrenal insufficiency and sarcoidosis on prednisone , CKD stage IIIa, hypertension, hyperlipidemia, normocytic anemia, hypothyroidism     Diet Order  Diet heart healthy/carb modified Room service appropriate? Yes; Fluid consistency: Thin  Diet effective now                                  Consultants: Neurologist Psychiatrist  Procedures: None    Medications:    aspirin  EC  81 mg  Oral Daily   bisacodyl   10 mg Rectal Once   clopidogrel   75 mg Oral Daily   ezetimibe   10 mg Oral Daily   Gerhardt's butt cream   Topical BID   guaiFENesin   600 mg Oral BID   hydrALAZINE   10 mg Oral BID   insulin  aspart  0-15 Units Subcutaneous TID WC   insulin  aspart protamine- aspart  25 Units Subcutaneous BID WC   isosorbide  mononitrate  30 mg Oral Daily   levothyroxine   50 mcg Oral Q0600   metoprolol  succinate  25 mg Oral Daily   pantoprazole   40 mg Oral QHS   predniSONE   10 mg Oral QHS   rosuvastatin   5 mg Oral QHS   sacubitril -valsartan   1 tablet Oral BID   senna-docusate  1 tablet Oral QHS   spironolactone   12.5 mg Oral Daily   Continuous Infusions:  promethazine  (PHENERGAN ) injection (IM or IVPB) Stopped (11/14/23 0459)     Anti-infectives (From admission, onward)    Start     Dose/Rate Route Frequency Ordered Stop   11/13/23 2200  levofloxacin  (LEVAQUIN ) IVPB 500 mg        500 mg 100 mL/hr over 60 Minutes Intravenous Every 24 hours 11/13/23 0737 11/15/23 2205   11/11/23 2200  doxycycline  (VIBRA -TABS) tablet 100 mg  Status:  Discontinued        100 mg Oral Every 12 hours 11/11/23 1927 11/11/23 1959   11/11/23 2200  cefTRIAXone  (ROCEPHIN ) 2 g in sodium chloride  0.9 % 100 mL IVPB  Status:  Discontinued        2 g 200 mL/hr over 30 Minutes Intravenous Every 24 hours 11/11/23 1953 11/11/23 2007   11/11/23 2200  doxycycline  (VIBRAMYCIN ) 100 mg in sodium chloride  0.9 % 250 mL IVPB  Status:  Discontinued        100 mg 125 mL/hr over 120 Minutes Intravenous Every 12 hours 11/11/23 1957 11/11/23 2130   11/11/23 2200  levofloxacin  (LEVAQUIN ) IVPB 750 mg  Status:  Discontinued        750 mg 100 mL/hr over 90 Minutes Intravenous Every 24 hours 11/11/23 2015 11/13/23 0737   11/11/23 2100  Ampicillin -Sulbactam (UNASYN ) 3 g in sodium chloride  0.9 % 100 mL IVPB  Status:  Discontinued        3 g 200 mL/hr over 30 Minutes Intravenous Every 6 hours 11/11/23 2007 11/11/23 2130    11/11/23 2015  cefTRIAXone  (ROCEPHIN ) 1 g in sodium chloride  0.9 % 100 mL IVPB  Status:  Discontinued        1 g 200 mL/hr over 30 Minutes Intravenous Every 24 hours 11/11/23 1927 11/11/23 1955              Family Communication/Anticipated D/C date and plan/Code Status   DVT prophylaxis:      Code Status: Full Code  Family Communication: Plan discussed with his wife at the bedside Disposition Plan: Plan to discharge home   Status is: Inpatient Remains inpatient appropriate because: NSTEMI, pneumonia       Subjective:   Interval events noted.  He complains of constipation.  No abdominal pain, shortness of breath or chest pain.  His wife is at the bedside.  Blood sugar dropped overnight into the 50s  Objective:    Vitals:   11/15/23 1450 11/15/23 1929 11/16/23 0338 11/16/23 0759  BP: 126/62 134/85 (!) 118/52 138/60  Pulse: 67 77 63 69  Resp: 17 18 18 12   Temp: 98.2 F (36.8 C) 97.9 F (36.6 C) 98.1 F (36.7 C) 98.3 F (36.8 C)  TempSrc: Oral Oral  Oral  SpO2: 95% 98% 97% 96%   No data found.   Intake/Output Summary (Last 24 hours) at 11/16/2023 1024 Last data filed at 11/16/2023 0900 Gross per 24 hour  Intake 680 ml  Output --  Net 680 ml   There were no vitals filed for this visit.  Exam:  GEN: NAD SKIN: Warm and dry EYES: No pallor or icterus ENT: MMM CV: RRR PULM: CTA B ABD: soft, ND, NT, +BS CNS: AAO x 3, non focal EXT: Left foot wound    Data Reviewed:   I have personally reviewed following labs and imaging studies:  Labs: Labs show the following:   Basic Metabolic Panel: Recent Labs  Lab 11/11/23 2016 11/12/23 0434 11/12/23 1939 11/13/23 0321 11/14/23 0319 11/15/23 0326 11/15/23 1207  NA 139 139  --  131* 136 141  --   K 3.7 3.8  --  3.5 3.2* 4.1  --   CL 97* 96*  --  91* 96* 103  --   CO2 33* 32  --  31 30 29   --   GLUCOSE 261* 237*  --  194* 147* 204* 412*  BUN 18 16  --  26* 31* 22  --   CREATININE 1.38* 1.33*   --  1.52* 1.41* 1.08  --   CALCIUM  8.4* 8.4*  --  8.1* 7.7* 8.0*  --   MG 1.7 2.0  --  2.0 1.9  --   --   PHOS  --  1.2* 2.5 2.3* 2.8  --   --    GFR Estimated Creatinine Clearance: 64.9 mL/min (by C-G formula based on SCr of 1.08 mg/dL). Liver Function Tests: Recent Labs  Lab 11/09/23 1650 11/10/23 1934 11/11/23 2016 11/12/23 0434  AST 46* 28 29 28   ALT 29 20 18 20   ALKPHOS 62 56 66 69  BILITOT 1.2 1.2 1.2 1.4*  PROT 7.1 6.7 6.8 7.0  ALBUMIN 3.5 3.4* 3.0* 3.0*   No results for input(s): LIPASE, AMYLASE in the last 168 hours. No results for input(s): AMMONIA in the last 168 hours. Coagulation profile No results for input(s): INR, PROTIME in the last 168 hours.  CBC: Recent Labs  Lab 11/09/23 1650 11/10/23 1934 11/11/23 0749 11/12/23 0434 11/13/23 0321 11/14/23 0319 11/15/23 0326  WBC 12.6*   < > 14.3* 14.3* 19.3* 12.5* 10.8*  NEUTROABS 9.8*  --   --   --   --   --   --   HGB 13.9   < > 12.6* 12.9* 12.2* 11.9* 13.1  HCT 42.4   < > 40.7 41.0 37.1* 35.0* 39.5  MCV 99.1   < > 102.3* 102.5* 99.2 96.7 97.3  PLT 331   < > 267 269 249 245 245   < > = values in this interval not displayed.   Cardiac Enzymes: Recent Labs  Lab 11/11/23 2159  CKTOTAL 72   BNP (last 3 results) No results for input(s): PROBNP in the last 8760 hours. CBG: Recent Labs  Lab 11/15/23  1927 11/16/23 0008 11/16/23 0046 11/16/23 0334 11/16/23 0733  GLUCAP 89 51* 95 255* 265*   D-Dimer: No results for input(s): DDIMER in the last 72 hours. Hgb A1c: No results for input(s): HGBA1C in the last 72 hours. Lipid Profile: No results for input(s): CHOL, HDL, LDLCALC, TRIG, CHOLHDL, LDLDIRECT in the last 72 hours.  Thyroid  function studies: No results for input(s): TSH, T4TOTAL, T3FREE, THYROIDAB in the last 72 hours.  Invalid input(s): FREET3 Anemia work up: No results for input(s): VITAMINB12, FOLATE, FERRITIN, TIBC, IRON , RETICCTPCT in  the last 72 hours. Sepsis Labs: Recent Labs  Lab 11/11/23 2016 11/11/23 2244 11/12/23 0434 11/13/23 0321 11/14/23 0319 11/15/23 0326  WBC  --   --  14.3* 19.3* 12.5* 10.8*  LATICACIDVEN 1.4 1.2  --   --   --   --     Microbiology Recent Results (from the past 240 hours)  Resp panel by RT-PCR (RSV, Flu A&B, Covid) Anterior Nasal Swab     Status: None   Collection Time: 11/10/23 10:55 AM   Specimen: Anterior Nasal Swab  Result Value Ref Range Status   SARS Coronavirus 2 by RT PCR NEGATIVE NEGATIVE Final   Influenza A by PCR NEGATIVE NEGATIVE Final   Influenza B by PCR NEGATIVE NEGATIVE Final    Comment: (NOTE) The Xpert Xpress SARS-CoV-2/FLU/RSV plus assay is intended as an aid in the diagnosis of influenza from Nasopharyngeal swab specimens and should not be used as a sole basis for treatment. Nasal washings and aspirates are unacceptable for Xpert Xpress SARS-CoV-2/FLU/RSV testing.  Fact Sheet for Patients: BloggerCourse.com  Fact Sheet for Healthcare Providers: SeriousBroker.it  This test is not yet approved or cleared by the [place]  FDA and has been authorized for detection and/or diagnosis of SARS-CoV-2 by FDA under an Emergency Use Authorization (EUA). This EUA will remain in effect (meaning this test can be used) for the duration of the COVID-19 declaration under Section 564(b)(1) of the Act, 21 U.S.C. section 360bbb-3(b)(1), unless the authorization is terminated or revoked.     Resp Syncytial Virus by PCR NEGATIVE NEGATIVE Final    Comment: (NOTE) Fact Sheet for Patients: BloggerCourse.com  Fact Sheet for Healthcare Providers: SeriousBroker.it  This test is not yet approved or cleared by the [place]  FDA and has been authorized for detection and/or diagnosis of SARS-CoV-2 by FDA under an Emergency Use Authorization (EUA). This EUA will remain in  effect (meaning this test can be used) for the duration of the COVID-19 declaration under Section 564(b)(1) of the Act, 21 U.S.C. section 360bbb-3(b)(1), unless the authorization is terminated or revoked.  Performed at [facility], [address], [place], [place] [address]     Procedures and diagnostic studies:  No results found.              LOS: 5 days   Emorie Mcfate  [facility]   Pager on [email or web address]. If 7PM-7AM, please contact night-coverage at [email or web address]     11/16/2023, 10:24 AM

## 2023-11-16 NOTE — H&P (View-Only) (Signed)
 Rounding Note   Patient Name: [name] Date of Encounter: 11/16/2023  [facility] Cardiologist: Oneil Parchment, MD   Subjective Laying in bed, comfortable no complaints Denies active chest pain or shortness of breath No family at the bedside Working with OT, PT, balance issues    Scheduled Meds:  aspirin  EC  81 mg Oral Daily   bisacodyl   10 mg Rectal Once   clopidogrel   75 mg Oral Daily   ezetimibe   10 mg Oral Daily   Gerhardt's butt cream   Topical BID   guaiFENesin   600 mg Oral BID   hydrALAZINE   10 mg Oral BID   insulin  aspart  0-15 Units Subcutaneous TID WC   insulin  aspart protamine- aspart  25 Units Subcutaneous BID WC   isosorbide  mononitrate  30 mg Oral Daily   levothyroxine   50 mcg Oral Q0600   metoprolol  succinate  25 mg Oral Daily   pantoprazole   40 mg Oral QHS   predniSONE   10 mg Oral QHS   rosuvastatin   5 mg Oral QHS   sacubitril -valsartan   1 tablet Oral BID   senna-docusate  1 tablet Oral QHS   spironolactone   12.5 mg Oral Daily   Continuous Infusions:  promethazine  (PHENERGAN ) injection (IM or IVPB) Stopped (11/14/23 0459)   PRN Meds: acetaminophen  **OR** acetaminophen , albuterol , magnesium  hydroxide, montelukast , nitroGLYCERIN , ondansetron  **OR** ondansetron  (ZOFRAN ) IV, promethazine  (PHENERGAN ) injection (IM or IVPB), traZODone    Vital Signs  Vitals:   11/15/23 1450 11/15/23 1929 11/16/23 0338 11/16/23 0759  BP: 126/62 134/85 (!) 118/52 138/60  Pulse: 67 77 63 69  Resp: 17 18 18 12   Temp: 98.2 F (36.8 C) 97.9 F (36.6 C) 98.1 F (36.7 C) 98.3 F (36.8 C)  TempSrc: Oral Oral  Oral  SpO2: 95% 98% 97% 96%    Intake/Output Summary (Last 24 hours) at 11/16/2023 1222 Last data filed at 11/16/2023 0900 Gross per 24 hour  Intake 680 ml  Output --  Net 680 ml      11/10/2023    7:37 PM 11/09/2023    4:48 PM 10/09/2023   11:22 PM  Last 3 Weights  Weight (lbs)  200 lb 200 lb 1.6 oz  Weight (kg)  90.719 kg 90.765 kg     Information is  confidential and restricted. Go to Review Flowsheets to unlock data.      Telemetry Normal sinus rhythm- Personally Reviewed  ECG   - Personally Reviewed  Physical Exam  GEN: No acute distress.   Neck: No JVD Cardiac: RRR, no murmurs, rubs, or gallops.  Respiratory: Clear to auscultation bilaterally. GI: Soft, nontender, non-distended  MS: No edema; No deformity. Neuro:  Nonfocal  Psych: Normal affect   Labs High Sensitivity Troponin:   Recent Labs  Lab 11/11/23 2016 11/11/23 2244 11/12/23 0434  TROPONINIHS 1,170* 992* 753*     Chemistry Recent Labs  Lab 11/10/23 1934 11/11/23 0749 11/11/23 2016 11/12/23 0434 11/13/23 0321 11/14/23 0319 11/15/23 0326 11/15/23 1207  NA 137   < > 139 139 131* 136 141  --   K 4.0   < > 3.7 3.8 3.5 3.2* 4.1  --   CL 98   < > 97* 96* 91* 96* 103  --   CO2 29   < > 33* 32 31 30 29   --   GLUCOSE 256*   < > 261* 237* 194* 147* 204* 412*  BUN 22   < > 18 16 26* 31* 22  --  CREATININE 1.49*   < > 1.38* 1.33* 1.52* 1.41* 1.08  --   CALCIUM  8.9   < > 8.4* 8.4* 8.1* 7.7* 8.0*  --   MG  --    < > 1.7 2.0 2.0 1.9  --   --   PROT 6.7  --  6.8 7.0  --   --   --   --   ALBUMIN 3.4*  --  3.0* 3.0*  --   --   --   --   AST 28  --  29 28  --   --   --   --   ALT 20  --  18 20  --   --   --   --   ALKPHOS 56  --  66 69  --   --   --   --   BILITOT 1.2  --  1.2 1.4*  --   --   --   --   GFRNONAA 48*   < > 53* 55* 47* 51* >60  --   ANIONGAP 10   < > 9 11 9 10 9   --    < > = values in this interval not displayed.    Lipids  Recent Labs  Lab 11/13/23 0321  CHOL 165  TRIG 117  HDL 34*  LDLCALC 108*  CHOLHDL 4.9    Hematology Recent Labs  Lab 11/13/23 0321 11/14/23 0319 11/15/23 0326  WBC 19.3* 12.5* 10.8*  RBC 3.74* 3.62* 4.06*  HGB 12.2* 11.9* 13.1  HCT 37.1* 35.0* 39.5  MCV 99.2 96.7 97.3  MCH 32.6 32.9 32.3  MCHC 32.9 34.0 33.2  RDW 13.2 13.2 13.3  PLT 249 245 245   Thyroid   Recent Labs  Lab 11/11/23 0749  TSH 3.163     BNP Recent Labs  Lab 11/11/23 2016  BNP 838.2*    DDimer  Recent Labs  Lab 11/11/23 2016  DDIMER 1.48*     Radiology  No results found.  Cardiac Studies   Patient Profile   78 y.o. male  with a hx of CAD s/p multiple PCIs , HFmrEF, pulmonary sarcoidosis, HNT, PAD, HLD, adrenal insufficiency on chronic prednisone , chronic hearing loss, anemia with prior GI bleed, CKD stage who is being seen for ongoing management of NSTEMI.   Assessment & Plan  Non-STEMI - November 09, 2023 overdosed on undetermined number of tramadol Had symptoms of hypoxia, tachycardia, hypertensive,  kidney disease -Echocardiogram with ejection fraction 35 to 40% with regional wall motion abnormality anterior wall, mid and distal anterior septum, inferior septum,  posterior wall, and apex are hypokinetic.  - EF down from 55 to 60% in May 2024 -Troponin greater than 1100 at peak -Initially declined catheterization until yesterday, was placed on the schedule for tomorrow, n.p.o. at 11 AM He continues to be in agreement with the plan Continue aspirin  Plavix  Zetia  Crestor    Cardiomyopathy,  ischemic On Entresto , metoprolol  succinate, spironolactone , Imdur  - Consider addition of SGLT2 inhibitor   Suicidal ideation/attempt Tramadol overdose on arrival September 4 Seen by psychiatry, diagnosed with unintentional drug overdose, adjustment disorder   Hypoxia/fever Found to have bronchitis/pneumonia CT chest with peribronchial opacities compatible with bronchitis   Mechanical fall November 13, 2023, CT head negative Gait instability exacerbated by left foot wound from diabetic ulcer, status post debridement October 18, 2023, completed Keflex   PAD Stent to left lower extremity October 17, 2023 at [place] [facility] discharged on aspirin  Plavix   For questions or updates,  please contact [facility] Please consult [email or web address] for contact info under     Signed, Zamya Culhane, MD  11/16/2023,  12:22 PM

## 2023-11-16 NOTE — Progress Notes (Signed)
 Heart Failure Stewardship Pharmacy Note  PCP: Arloa Elsie SAUNDERS, MD PCP-Cardiologist: Oneil Parchment, MD  HPI: [name] is a 78 y.o. male with adrenal insufficiency, chronic kidney disease stage III, heart failure with reduced ejection fraction, coronary artery disease, PAD, insulin -dependent diabetes, chronic hearing loss, hyperlipidemia, hypothyroidism, iron  deficiency anemia, lumbar degenerative disc disease with radiculopathy, chronic eczema, history of Charcot foot of the left, history of diabetic neuropathy, obstructive sleep apnea and sarcoidosis, essential hypertension who presented with suicidal ideation. On admission, BNP was 838.2, AM cortisol 13.7, UDS negative, lactate 1.4, HS-troponin was 1170 > 992 > 753, and TSH 3.163. On 11/10/23, patient became hypoxic and experienced vomiting. Chest x-ray noted bilateral lower lobe airspace opacities, right greater than left concerning for pneumonia suspected to be aspiration PNA.   Pertinent cardiac history: CAD with DES to LAD and PLA in 2011, DESx2 right posterior dedscending artery in 08/2018, DES to pRCA mRCA and 1st diag in 01/2021. Most recent LHC 08/2021 at [facility] showed nonobstructive CAD and no interventions were performed. TTE 11/2023 with newly reduced LVEF to 35-40% with G1DD.   Pertinent Lab Values: Creat  Date Value Ref Range Status  01/09/2020 2.17 (H) 0.70 - 1.18 mg/dL Final    Comment:    For patients >32 years of age, the reference limit for Creatinine is approximately 13% higher for people identified as African-American. .    Creatinine, Ser  Date Value Ref Range Status  11/15/2023 1.08 0.61 - 1.24 mg/dL Final   BUN  Date Value Ref Range Status  11/15/2023 22 8 - 23 mg/dL Final  89/75/7976 19 8 - 27 mg/dL Final   Potassium  Date Value Ref Range Status  11/15/2023 4.1 3.5 - 5.1 mmol/L Final   Sodium  Date Value Ref Range Status  11/15/2023 141 135 - 145 mmol/L Final  12/28/2021 142 134 - 144 mmol/L Final   B  Natriuretic Peptide  Date Value Ref Range Status  11/11/2023 838.2 (H) 0.0 - 100.0 pg/mL Final    Comment:    Performed at [facility], [address] [place], [place] [address]   Magnesium   Date Value Ref Range Status  11/14/2023 1.9 1.7 - 2.4 mg/dL Final    Comment:    Performed at [facility], [address] [place], [place] [address]   Hgb A1c MFr Bld  Date Value Ref Range Status  10/09/2023 10.6 (H) 4.8 - 5.6 % Final    Comment:    (NOTE)         Prediabetes: 5.7 - 6.4         Diabetes: >6.4         Glycemic control for adults with diabetes: <7.0    TSH  Date Value Ref Range Status  11/11/2023 3.163 0.350 - 4.500 uIU/mL Final    Comment:    Performed by a 3rd Generation assay with a functional sensitivity of <=0.01 uIU/mL. Performed at [facility], [address] [place], [place] [address]   07/27/2018 6.200 (H) 0.450 - 4.500 uIU/mL Final   LDH  Date Value Ref Range Status  11/13/2019 181 98 - 192 U/L Final    Comment:    Performed at [facility], [address] [place], [place] [address]    Vital Signs:  Temp:  [97.9 F (36.6 C)-98.3 F (36.8 C)] 98.3 F (36.8 C) (09/11 0759) Pulse Rate:  [63-77] 69 (09/11 0759) Cardiac Rhythm: Other (Comment);Bundle branch block (09/11 0730) Resp:  [  12-18] 12 (09/11 0759) BP: (118-138)/(52-85) 138/60 (09/11 0759) SpO2:  [95 %-98 %] 96 % (09/11 0759)  Intake/Output Summary (Last 24 hours) at 11/16/2023 1100 Last data filed at 11/16/2023 0900 Gross per 24 hour  Intake 680 ml  Output --  Net 680 ml   Current Heart Failure Medications:  Loop diuretic: none Beta-Blocker: metoprolol  succinate 25 mg daily ACEI/ARB/ARNI: Entresto  24-26 mg BID MRA: none SGLT2i: none Other: hydralazine  10 mg BID  Prior to admission Heart Failure Medications:  Loop diuretic: furosemide  80 mg prn Beta-Blocker: none ACEI/ARB/ARNI: losartan  100 mg daily MRA: none SGLT2i:  none Other: hydralazine  10 mg BID, amlodipine   Assessment: 1. Acute combined systolic and diastolic heart failure (LVEF 35-40%) with G1DD, due to ICM. NYHA class II-III symptoms.  -Symptoms: Reports feeling well overall. Reports an episode of nausea overnight. -Volume: Appears to be euvolemic. Creatinine has returned to baseline, though not checked today. No indication for loop diuretics at this time.  -Hemodynamics: BP is elevated today and HR 70-80s.  -BB: Continue metoprolol  succinate 25 mg daily.   -ACEI/ARB/ARNI: Currently on Entresto  24-26 mg BID. Would consider increasing if BP stable after adding spironolactone  and Jardiance. -MRA: Continue spironolactone  12.5 mg daily -SGLT2i: WBC trending down after steroid decrease. Would consider adding Jardiance as Jardiance is significantly cheaper for this patient than [place]. -PTA amlodipine  stopped. Would not resume at discharge.  Plan: 1) Medication changes recommended at this time: - Consider starting Jardiance 10 mg daily   2) Patient assistance: -Generic Entresto  copay is $47.00, Doreen copay is $253.82, Jardiance copay is $47.00   3) Education: - Patient has been educated on current HF medications and potential additions to HF medication regimen - Patient verbalizes understanding that over the next few months, these medication doses may change and more medications may be added to optimize HF regimen - Patient has been educated on basic disease state pathophysiology and goals of therapy  Medication Assistance / Insurance Benefits Check: Does the patient have prescription insurance? Prescription Insurance: Medicare  Type of insurance plan:  Does the patient qualify for medication assistance through manufacturers or grants? Pending   Outpatient Pharmacy: Prior to admission outpatient pharmacy: [facility]      Please do not hesitate to reach out with questions or concerns,  Jaun Bash, PharmD, CPP, BCPS, [facility] Heart Failure  Pharmacist  Phone - [phone] 11/16/2023 11:00 AM

## 2023-11-16 NOTE — Progress Notes (Signed)
 Mobility Specialist - Progress Note   11/16/23 1208  Mobility  Activity Pivoted/transferred to/from [facility]  Level of Assistance Standby assist, set-up cues, supervision of patient - no hands on  Assistive Device None (recliner)  Activity Response Tolerated well  Mobility visit 1 Mobility  Mobility Specialist Start Time (ACUTE ONLY) 1200  Mobility Specialist Stop Time (ACUTE ONLY) 1205  Mobility Specialist Time Calculation (min) (ACUTE ONLY) 5 min   Pt supine upon entry, utilizing RA. Pt agreeable to transfer to the recliner this date. Pt transferred to the recliner via scoot/slide transfer MinG-- no hands on assist required. Pt left seated with alarm set and needs within reach. RN and NT notified.  America Silvan Mobility Specialist 11/16/23 12:11 PM

## 2023-11-16 NOTE — Plan of Care (Signed)

## 2023-11-16 NOTE — Progress Notes (Signed)
 Rounding Note   Patient Name: [name] Date of Encounter: 11/16/2023  [facility] Cardiologist: Oneil Parchment, MD   Subjective Laying in bed, comfortable no complaints Denies active chest pain or shortness of breath No family at the bedside Working with OT, PT, balance issues    Scheduled Meds:  aspirin  EC  81 mg Oral Daily   bisacodyl   10 mg Rectal Once   clopidogrel   75 mg Oral Daily   ezetimibe   10 mg Oral Daily   Gerhardt's butt cream   Topical BID   guaiFENesin   600 mg Oral BID   hydrALAZINE   10 mg Oral BID   insulin  aspart  0-15 Units Subcutaneous TID WC   insulin  aspart protamine- aspart  25 Units Subcutaneous BID WC   isosorbide  mononitrate  30 mg Oral Daily   levothyroxine   50 mcg Oral Q0600   metoprolol  succinate  25 mg Oral Daily   pantoprazole   40 mg Oral QHS   predniSONE   10 mg Oral QHS   rosuvastatin   5 mg Oral QHS   sacubitril -valsartan   1 tablet Oral BID   senna-docusate  1 tablet Oral QHS   spironolactone   12.5 mg Oral Daily   Continuous Infusions:  promethazine  (PHENERGAN ) injection (IM or IVPB) Stopped (11/14/23 0459)   PRN Meds: acetaminophen  **OR** acetaminophen , albuterol , magnesium  hydroxide, montelukast , nitroGLYCERIN , ondansetron  **OR** ondansetron  (ZOFRAN ) IV, promethazine  (PHENERGAN ) injection (IM or IVPB), traZODone    Vital Signs  Vitals:   11/15/23 1450 11/15/23 1929 11/16/23 0338 11/16/23 0759  BP: 126/62 134/85 (!) 118/52 138/60  Pulse: 67 77 63 69  Resp: 17 18 18 12   Temp: 98.2 F (36.8 C) 97.9 F (36.6 C) 98.1 F (36.7 C) 98.3 F (36.8 C)  TempSrc: Oral Oral  Oral  SpO2: 95% 98% 97% 96%    Intake/Output Summary (Last 24 hours) at 11/16/2023 1222 Last data filed at 11/16/2023 0900 Gross per 24 hour  Intake 680 ml  Output --  Net 680 ml      11/10/2023    7:37 PM 11/09/2023    4:48 PM 10/09/2023   11:22 PM  Last 3 Weights  Weight (lbs)  200 lb 200 lb 1.6 oz  Weight (kg)  90.719 kg 90.765 kg     Information is  confidential and restricted. Go to Review Flowsheets to unlock data.      Telemetry Normal sinus rhythm- Personally Reviewed  ECG   - Personally Reviewed  Physical Exam  GEN: No acute distress.   Neck: No JVD Cardiac: RRR, no murmurs, rubs, or gallops.  Respiratory: Clear to auscultation bilaterally. GI: Soft, nontender, non-distended  MS: No edema; No deformity. Neuro:  Nonfocal  Psych: Normal affect   Labs High Sensitivity Troponin:   Recent Labs  Lab 11/11/23 2016 11/11/23 2244 11/12/23 0434  TROPONINIHS 1,170* 992* 753*     Chemistry Recent Labs  Lab 11/10/23 1934 11/11/23 0749 11/11/23 2016 11/12/23 0434 11/13/23 0321 11/14/23 0319 11/15/23 0326 11/15/23 1207  NA 137   < > 139 139 131* 136 141  --   K 4.0   < > 3.7 3.8 3.5 3.2* 4.1  --   CL 98   < > 97* 96* 91* 96* 103  --   CO2 29   < > 33* 32 31 30 29   --   GLUCOSE 256*   < > 261* 237* 194* 147* 204* 412*  BUN 22   < > 18 16 26* 31* 22  --  CREATININE 1.49*   < > 1.38* 1.33* 1.52* 1.41* 1.08  --   CALCIUM  8.9   < > 8.4* 8.4* 8.1* 7.7* 8.0*  --   MG  --    < > 1.7 2.0 2.0 1.9  --   --   PROT 6.7  --  6.8 7.0  --   --   --   --   ALBUMIN 3.4*  --  3.0* 3.0*  --   --   --   --   AST 28  --  29 28  --   --   --   --   ALT 20  --  18 20  --   --   --   --   ALKPHOS 56  --  66 69  --   --   --   --   BILITOT 1.2  --  1.2 1.4*  --   --   --   --   GFRNONAA 48*   < > 53* 55* 47* 51* >60  --   ANIONGAP 10   < > 9 11 9 10 9   --    < > = values in this interval not displayed.    Lipids  Recent Labs  Lab 11/13/23 0321  CHOL 165  TRIG 117  HDL 34*  LDLCALC 108*  CHOLHDL 4.9    Hematology Recent Labs  Lab 11/13/23 0321 11/14/23 0319 11/15/23 0326  WBC 19.3* 12.5* 10.8*  RBC 3.74* 3.62* 4.06*  HGB 12.2* 11.9* 13.1  HCT 37.1* 35.0* 39.5  MCV 99.2 96.7 97.3  MCH 32.6 32.9 32.3  MCHC 32.9 34.0 33.2  RDW 13.2 13.2 13.3  PLT 249 245 245   Thyroid   Recent Labs  Lab 11/11/23 0749  TSH 3.163     BNP Recent Labs  Lab 11/11/23 2016  BNP 838.2*    DDimer  Recent Labs  Lab 11/11/23 2016  DDIMER 1.48*     Radiology  No results found.  Cardiac Studies   Patient Profile   78 y.o. male  with a hx of CAD s/p multiple PCIs , HFmrEF, pulmonary sarcoidosis, HNT, PAD, HLD, adrenal insufficiency on chronic prednisone , chronic hearing loss, anemia with prior GI bleed, CKD stage who is being seen for ongoing management of NSTEMI.   Assessment & Plan  Non-STEMI - November 09, 2023 overdosed on undetermined number of tramadol Had symptoms of hypoxia, tachycardia, hypertensive,  kidney disease -Echocardiogram with ejection fraction 35 to 40% with regional wall motion abnormality anterior wall, mid and distal anterior septum, inferior septum,  posterior wall, and apex are hypokinetic.  - EF down from 55 to 60% in May 2024 -Troponin greater than 1100 at peak -Initially declined catheterization until yesterday, was placed on the schedule for tomorrow, n.p.o. at 11 AM He continues to be in agreement with the plan Continue aspirin  Plavix  Zetia  Crestor    Cardiomyopathy,  ischemic On Entresto , metoprolol  succinate, spironolactone , Imdur  - Consider addition of SGLT2 inhibitor   Suicidal ideation/attempt Tramadol overdose on arrival September 4 Seen by psychiatry, diagnosed with unintentional drug overdose, adjustment disorder   Hypoxia/fever Found to have bronchitis/pneumonia CT chest with peribronchial opacities compatible with bronchitis   Mechanical fall November 13, 2023, CT head negative Gait instability exacerbated by left foot wound from diabetic ulcer, status post debridement October 18, 2023, completed Keflex   PAD Stent to left lower extremity October 17, 2023 at [facility] discharged on aspirin  Plavix   For questions or updates,  please contact [facility] Please consult [email or web address] for contact info under     Signed, Zamya Culhane, MD  11/16/2023,  12:22 PM

## 2023-11-17 ENCOUNTER — Other Ambulatory Visit: Payer: Self-pay

## 2023-11-17 ENCOUNTER — Encounter: Payer: Self-pay | Admitting: Cardiovascular Disease

## 2023-11-17 ENCOUNTER — Encounter
Admission: AD | Disposition: A | Discharge status: Home Health | Payer: Self-pay | Source: Ambulatory Visit | Attending: Internal Medicine

## 2023-11-17 DIAGNOSIS — I214 Non-ST elevation (NSTEMI) myocardial infarction: Secondary | ICD-10-CM | POA: Diagnosis not present

## 2023-11-17 DIAGNOSIS — I2510 Atherosclerotic heart disease of native coronary artery without angina pectoris: Secondary | ICD-10-CM | POA: Diagnosis not present

## 2023-11-17 DIAGNOSIS — I255 Ischemic cardiomyopathy: Secondary | ICD-10-CM | POA: Diagnosis not present

## 2023-11-17 DIAGNOSIS — J690 Pneumonitis due to inhalation of food and vomit: Secondary | ICD-10-CM | POA: Diagnosis not present

## 2023-11-17 DIAGNOSIS — I739 Peripheral vascular disease, unspecified: Secondary | ICD-10-CM | POA: Diagnosis not present

## 2023-11-17 HISTORY — PX: LEFT HEART CATH AND CORONARY ANGIOGRAPHY: CATH118249

## 2023-11-17 LAB — GLUCOSE, CAPILLARY
Glucose-Capillary: 282 mg/dL — ABNORMAL HIGH (ref 70–99)
Glucose-Capillary: 270 mg/dL — ABNORMAL HIGH (ref 70–99)
Glucose-Capillary: 218 mg/dL — ABNORMAL HIGH (ref 70–99)
Glucose-Capillary: 390 mg/dL — ABNORMAL HIGH (ref 70–99)
Glucose-Capillary: 293 mg/dL — ABNORMAL HIGH (ref 70–99)

## 2023-11-17 SURGERY — LEFT HEART CATH AND CORONARY ANGIOGRAPHY
Anesthesia: Moderate Sedation

## 2023-11-17 MED ORDER — FENTANYL CITRATE (PF) 100 MCG/2ML IJ SOLN
INTRAMUSCULAR | Status: DC | PRN
  Administered 2023-11-17: 25 ug via INTRAVENOUS

## 2023-11-17 MED ORDER — INSULIN ASPART PROT & ASPART (70-30 MIX) 100 UNIT/ML ~~LOC~~ SUSP
25.0000 [IU] | Freq: Two times a day (BID) | SUBCUTANEOUS | Status: DC
Start: 2023-11-17 — End: 2023-11-18
  Administered 2023-11-17 – 2023-11-18 (×2): 25 [IU] via SUBCUTANEOUS
  Filled 2023-11-17 (×2): qty 10

## 2023-11-17 MED ORDER — SODIUM CHLORIDE 0.9% FLUSH: 3.0000 mL | INTRAVENOUS | Status: DC | PRN

## 2023-11-17 MED ORDER — HEPARIN (PORCINE) IN NACL 1000-0.9 UT/500ML-% IV SOLN
INTRAVENOUS | Status: DC | PRN
  Administered 2023-11-17: 1000 mL

## 2023-11-17 MED ORDER — ISOSORBIDE MONONITRATE ER 30 MG PO TB24: 30.0000 mg | ORAL_TABLET | Freq: Every day | ORAL | 0 refills | Status: AC

## 2023-11-17 MED ORDER — SODIUM CHLORIDE 0.9 % IV SOLN
INTRAVENOUS | Status: DC
  Administered 2023-11-17: 250 mL via INTRAVENOUS

## 2023-11-17 MED ORDER — FENTANYL CITRATE (PF) 100 MCG/2ML IJ SOLN
INTRAMUSCULAR | Status: AC
  Filled 2023-11-17: qty 2

## 2023-11-17 MED ORDER — SODIUM CHLORIDE 0.9% FLUSH
3.0000 mL | Freq: Two times a day (BID) | INTRAVENOUS | Status: DC
Start: 2023-11-17 — End: 2023-11-18
  Administered 2023-11-17 – 2023-11-18 (×3): 3 mL via INTRAVENOUS

## 2023-11-17 MED ORDER — GERHARDT'S BUTT CREAM: 1.0000 | TOPICAL_CREAM | Freq: Two times a day (BID) | CUTANEOUS | Status: AC

## 2023-11-17 MED ORDER — EZETIMIBE 10 MG PO TABS: 10.0000 mg | ORAL_TABLET | Freq: Every day | ORAL | Status: AC

## 2023-11-17 MED ORDER — ASPIRIN 81 MG PO CHEW
81.0000 mg | CHEWABLE_TABLET | ORAL | Status: AC
  Administered 2023-11-17: 81 mg via ORAL

## 2023-11-17 MED ORDER — SODIUM CHLORIDE 0.9 % IV SOLN: 250.0000 mL | INTRAVENOUS | Status: DC | PRN

## 2023-11-17 MED ORDER — SACUBITRIL-VALSARTAN 24-26 MG PO TABS: 1.0000 | ORAL_TABLET | Freq: Two times a day (BID) | ORAL | 0 refills | Status: AC

## 2023-11-17 MED ORDER — LIDOCAINE HCL 1 % IJ SOLN
INTRAMUSCULAR | Status: AC
  Filled 2023-11-17: qty 20

## 2023-11-17 MED ORDER — IOHEXOL 300 MG/ML  SOLN
INTRAMUSCULAR | Status: DC | PRN
  Administered 2023-11-17: 43 mL

## 2023-11-17 MED ORDER — ADULT MULTIVITAMIN W/MINERALS CH: 1.0000 | ORAL_TABLET | Freq: Every day | ORAL | Status: AC

## 2023-11-17 MED ORDER — HEPARIN SODIUM (PORCINE) 1000 UNIT/ML IJ SOLN
INTRAMUSCULAR | Status: AC
  Filled 2023-11-17: qty 10

## 2023-11-17 MED ORDER — METOPROLOL SUCCINATE ER 25 MG PO TB24: 25.0000 mg | ORAL_TABLET | Freq: Every day | ORAL | 0 refills | Status: AC

## 2023-11-17 MED ORDER — HEPARIN SODIUM (PORCINE) 1000 UNIT/ML IJ SOLN
INTRAMUSCULAR | Status: DC | PRN
  Administered 2023-11-17: 4500 [IU] via INTRAVENOUS

## 2023-11-17 MED ORDER — HEPARIN (PORCINE) IN NACL 1000-0.9 UT/500ML-% IV SOLN
INTRAVENOUS | Status: AC
  Filled 2023-11-17: qty 1000

## 2023-11-17 MED ORDER — LIDOCAINE HCL (PF) 1 % IJ SOLN
INTRAMUSCULAR | Status: DC | PRN
  Administered 2023-11-17: 2 mL

## 2023-11-17 MED ORDER — ASPIRIN 81 MG PO TBEC: 81.0000 mg | DELAYED_RELEASE_TABLET | Freq: Every day | ORAL | Status: AC

## 2023-11-17 MED ORDER — VERAPAMIL HCL 2.5 MG/ML IV SOLN
INTRAVENOUS | Status: DC | PRN
  Administered 2023-11-17: 2.5 mg via INTRA_ARTERIAL

## 2023-11-17 MED ORDER — ZINC SULFATE 220 (50 ZN) MG PO CAPS: 220.0000 mg | ORAL_CAPSULE | Freq: Every day | ORAL | Status: AC

## 2023-11-17 MED ORDER — VERAPAMIL HCL 2.5 MG/ML IV SOLN
INTRAVENOUS | Status: AC
  Filled 2023-11-17: qty 2

## 2023-11-17 MED ORDER — AMLODIPINE BESYLATE 5 MG PO TABS: 5.0000 mg | ORAL_TABLET | Freq: Every day | ORAL | Status: AC

## 2023-11-17 MED ORDER — FREE WATER
500.0000 mL | Freq: Once | Status: AC
  Administered 2023-11-17: 500 mL via ORAL

## 2023-11-17 MED ORDER — MIDAZOLAM HCL 2 MG/2ML IJ SOLN
INTRAMUSCULAR | Status: DC | PRN
  Administered 2023-11-17: 1 mg via INTRAVENOUS

## 2023-11-17 MED ORDER — MIDAZOLAM HCL 2 MG/2ML IJ SOLN
INTRAMUSCULAR | Status: AC
  Filled 2023-11-17: qty 2

## 2023-11-17 MED ORDER — SPIRONOLACTONE 25 MG PO TABS: 12.5000 mg | ORAL_TABLET | Freq: Every day | ORAL | 0 refills | Status: AC

## 2023-11-17 MED ORDER — ASPIRIN 81 MG PO CHEW
CHEWABLE_TABLET | ORAL | Status: AC
Start: 2023-11-17 — End: 2023-11-17
  Filled 2023-11-17: qty 1

## 2023-11-17 SURGICAL SUPPLY — 8 items
CATH INFINITI AMBI 5FR JK (CATHETERS) IMPLANT
DEVICE RAD TR BAND REGULAR (VASCULAR PRODUCTS) IMPLANT
DRAPE BRACHIAL (DRAPES) IMPLANT
GLIDESHEATH SLEND SS 6F .021 (SHEATH) IMPLANT
GUIDEWIRE INQWIRE 1.5J.035X260 (WIRE) IMPLANT
PACK CARDIAC CATH (CUSTOM PROCEDURE TRAY) ×1 IMPLANT
SET ATX-X65L (MISCELLANEOUS) IMPLANT
STATION PROTECTION PRESSURIZED (MISCELLANEOUS) IMPLANT

## 2023-11-17 NOTE — Interval H&P Note (Signed)
 History and Physical Interval Note:  11/17/2023 10:53 AM  [name]  has presented today for surgery, with the diagnosis of elevated troponin, heart failure with reduced ejection fraction.  The various methods of treatment have been discussed with the patient and family. After consideration of risks, benefits and other options for treatment, the patient has consented to  Procedure(s): LEFT HEART CATH AND CORONARY ANGIOGRAPHY (N/A) as a surgical intervention.  The patient's history has been reviewed, patient examined, no change in status, stable for surgery.  I have reviewed the patient's chart and labs.  Questions were answered to the patient's satisfaction.     [name]

## 2023-11-17 NOTE — Progress Notes (Signed)
 Progress Note    [name]  FMW:[id number] DOB: 06/09/1945  DOA: 11/11/2023 PCP: Arloa Elsie SAUNDERS, MD      Brief Narrative:    Medical records reviewed and are as summarized below:  [name] is a 78 y.o. male with medical history significant of adrenal insufficiency, chronic kidney disease stage III, heart failure with midrange ejection fraction EF of 45 to 50%, coronary artery disease with lesion to LAD, PAD complex stenting done recently 4 weeks prior to admission, history of beta-blockers insulin -dependent diabetes, chronic hearing loss, hyperlipidemia, hypothyroidism, iron  deficiency anemia, lumbar degenerative disc disease with radiculopathy, chronic eczema, history of Charcot foot of the left, history of diabetic neuropathy, obstructive sleep apnea, sarcoidosis, essential hypertension.  He presented to [facility] ED on 11/09/2023 and was subsequently admitted to [facility] behavioral health unit on 11/11/2023 because of suicidal ideation.  Apparently, he had taken about 10 to 60 tablets of tramadol.  He was placed under involuntary commitment.   On 11/10/2023, rapid response was called at the [facility] psychiatric unit because of hypoxia and fever.  He was found to have pneumonia.       Assessment/Plan:   Principal Problem:   Aspiration pneumonia (HCC) Active Problems:   Sarcoidosis   Coronary artery disease with exertional angina (HCC)   GERD   OSA (obstructive sleep apnea)   History of adrenal insufficiency   Pulmonary sarcoidosis (HCC)   Uncontrolled type 2 diabetes mellitus with hyperglycemia, with long-term current use of insulin  (HCC)   Hyperlipidemia   Stage 3a chronic kidney disease (CKD) (HCC)   Suicidal behavior with attempted self-injury (HCC)   Non-ST elevation (NSTEMI) myocardial infarction (HCC)   HFrEF (heart failure with reduced ejection fraction) (HCC)   Pressure injury of skin   Elevated troponin   Cardiomyopathy (HCC)   Reported suicidal attempt  versus unintentional overdose: Apparently, he had taken about 10 to 60 tablets of tramadol prior to admission. He has been cleared by psychiatrist for discharge.   Pneumonia, probable aspiration: Chest x-ray with bilateral lower airspace opacities.  Completed Levaquin  on 11/15/2023.     Acute NSTEMI, history of CAD and PVD: Troponin 1,170, 992, 753. S/p left heart cath on 11/17/2023. Medical therapy was recommended.  Continue aspirin , Plavix  and Crestor . Recent stent to left lower extremity on 10/17/2023 at [facility] and was discharged on aspirin  and Plavix  Recent left foot wound debridement for diabetic foot ulcer on 10/18/2023 and has completed Keflex.   Left heart cath report as follows:    Prox LAD-2 lesion is 65% stenosed.   Mid Cx lesion is 50% stenosed.   Ost 1st Diag lesion is 100% stenosed.   Ost Ramus to Ramus lesion is 60% stenosed.   Prox LAD-1 lesion is 65% stenosed.   Non-stenotic Mid RCA lesion was previously treated.   Non-stenotic Prox RCA lesion was previously treated.   Non-stenotic Ost RPDA lesion was previously treated.   Non-stenotic Ost RPDA to RPDA lesion was previously treated.   Non-stenotic RPAV lesion was previously treated.   1.  Widely patent RCA stents with no significant restenosis.  Patent LAD stent with borderline significant in-stent restenosis.  Occluded diagonal stent is new compared to most recent catheterization.  Stable moderate left circumflex and ramus disease. 2.  Left ventricular angiography was not performed.  EF was moderately reduced by echo.  Normal left ventricular end-diastolic pressure.   Recommendations: Non-STEMI could have been related to diagonal stent occlusion versus supply/demand  ischemia in the setting of underlying coronary artery disease.  The diagonal gets collaterals from the RCA. Recommend medical therapy for now. If the patient has refractory angina, FFR guided PCI to the LAD can be considered.   HFrEF, probably  acute: 2D echo showed EF estimated at 35 to 45%. Continue Entresto , spironolactone , Toprol -XL and isosorbide  mononitrate Previous 2D echo in August 2025 showed EF estimated at 45 to 50%. History of chronic HFpEF   Type II DM with hyperglycemia, hypoglycemia: Glucose levels are stable, little on the high side.  Continue NovoLog  Mix 5 units twice daily and NovoLog  as needed for hyperglycemia.  Monitor glucose closely and adjust insulin  accordingly. He said he takes 25 to 40 units of NovoLog  mix 2 times or 3 times a day depending on his glucose levels and how many times he eats in a day.   Constipation: Improved status post manual disimpaction.  Continue laxatives as needed.  No acute abnormality or obstruction on abdominal x-ray but there was evidence of moderate stool burden.    S/p mechanical fall on 11/13/2023.  CT head negative. PT recommended home health therapy.   Comorbidities include adrenal insufficiency and sarcoidosis on prednisone , CKD stage IIIa, hypertension, hyperlipidemia, normocytic anemia, hypothyroidism   Discussed disposition plans.  Discussed all new discharge medications.  I explained to the patient that he was medically stable for discharge today and he was okay for discharge from cardiologist's standpoint.  However, he was concerned about going home today.  He said he was told not to use his right hand (postcardiac cath) for anything until 12 PM tomorrow.  He also had recent surgery on the left foot and will have difficulty getting around without using his right hand for his scooter or walker.  He insisted on staying in the hospital overnight and can have somebody pick him up from the hospital tomorrow. Discharge has been canceled. Plan discussed with patient and his wife at the bedside.    Diet Order             Diet heart healthy/carb modified Room service appropriate? Yes; Fluid consistency: Thin  Diet effective now           Diet - low sodium heart healthy                                   Consultants: Neurologist Psychiatrist  Procedures: None    Medications:    aspirin  EC  81 mg Oral Daily   clopidogrel   75 mg Oral Daily   ezetimibe   10 mg Oral Daily   free water   500 mL Oral Once   Gerhardt's butt cream   Topical BID   guaiFENesin   600 mg Oral BID   hydrALAZINE   10 mg Oral BID   insulin  aspart  0-15 Units Subcutaneous TID WC   insulin  aspart protamine- aspart  25 Units Subcutaneous BID WC   isosorbide  mononitrate  30 mg Oral Daily   levothyroxine   50 mcg Oral Q0600   metoprolol  succinate  25 mg Oral Daily   pantoprazole   40 mg Oral QHS   predniSONE   10 mg Oral QHS   rosuvastatin   5 mg Oral QHS   sacubitril -valsartan   1 tablet Oral BID   senna-docusate  1 tablet Oral QHS   sodium chloride  flush  3 mL Intravenous Q12H   spironolactone   12.5 mg Oral Daily   Continuous Infusions:  sodium chloride   promethazine  (PHENERGAN ) injection (IM or IVPB) Stopped (11/14/23 0459)     Anti-infectives (From admission, onward)    Start     Dose/Rate Route Frequency Ordered Stop   11/13/23 2200  levofloxacin  (LEVAQUIN ) IVPB 500 mg        500 mg 100 mL/hr over 60 Minutes Intravenous Every 24 hours 11/13/23 0737 11/15/23 2205   11/11/23 2200  doxycycline  (VIBRA -TABS) tablet 100 mg  Status:  Discontinued        100 mg Oral Every 12 hours 11/11/23 1927 11/11/23 1959   11/11/23 2200  cefTRIAXone  (ROCEPHIN ) 2 g in sodium chloride  0.9 % 100 mL IVPB  Status:  Discontinued        2 g 200 mL/hr over 30 Minutes Intravenous Every 24 hours 11/11/23 1953 11/11/23 2007   11/11/23 2200  doxycycline  (VIBRAMYCIN ) 100 mg in sodium chloride  0.9 % 250 mL IVPB  Status:  Discontinued        100 mg 125 mL/hr over 120 Minutes Intravenous Every 12 hours 11/11/23 1957 11/11/23 2130   11/11/23 2200  levofloxacin  (LEVAQUIN ) IVPB 750 mg  Status:  Discontinued        750 mg 100 mL/hr over 90 Minutes Intravenous Every 24 hours 11/11/23 2015  11/13/23 0737   11/11/23 2100  Ampicillin -Sulbactam (UNASYN ) 3 g in sodium chloride  0.9 % 100 mL IVPB  Status:  Discontinued        3 g 200 mL/hr over 30 Minutes Intravenous Every 6 hours 11/11/23 2007 11/11/23 2130   11/11/23 2015  cefTRIAXone  (ROCEPHIN ) 1 g in sodium chloride  0.9 % 100 mL IVPB  Status:  Discontinued        1 g 200 mL/hr over 30 Minutes Intravenous Every 24 hours 11/11/23 1927 11/11/23 1955              Family Communication/Anticipated D/C date and plan/Code Status   DVT prophylaxis:      Code Status: Full Code  Family Communication: Plan discussed with his wife at the bedside Disposition Plan: Plan to discharge home   Status is: Inpatient Remains inpatient appropriate because: NSTEMI, pneumonia       Subjective:   Interval events noted.  He complains of constipation.  No abdominal pain, shortness of breath or chest pain.  His wife is at the bedside.  Blood sugar dropped overnight into the 50s  Objective:    Vitals:   11/17/23 1230 11/17/23 1245 11/17/23 1300 11/17/23 1330  BP: 136/74 (!) 140/68 (!) 132/58 (!) 145/72  Pulse: 66 (!) 59 (!) 56 66  Resp:  16 16 18   Temp:    98 F (36.7 C)  TempSrc:    Oral  SpO2: 98% 93% 95% 97%   No data found.   Intake/Output Summary (Last 24 hours) at 11/17/2023 1423 Last data filed at 11/16/2023 1900 Gross per 24 hour  Intake 240 ml  Output --  Net 240 ml   There were no vitals filed for this visit.  Exam:  GEN: NAD SKIN: Warm and dry EYES: No pallor or icterus ENT: MMM CV: RRR PULM: CTA B ABD: soft, ND, NT, +BS CNS: AAO x 3, non focal EXT: Left foot wound    Data Reviewed:   I have personally reviewed following labs and imaging studies:  Labs: Labs show the following:   Basic Metabolic Panel: Recent Labs  Lab 11/11/23 2016 11/12/23 0434 11/12/23 1939 11/13/23 0321 11/14/23 0319 11/15/23 0326 11/15/23 1207  NA 139 139  --  131*  136 141  --   K 3.7 3.8  --  3.5 3.2* 4.1   --   CL 97* 96*  --  91* 96* 103  --   CO2 33* 32  --  31 30 29   --   GLUCOSE 261* 237*  --  194* 147* 204* 412*  BUN 18 16  --  26* 31* 22  --   CREATININE 1.38* 1.33*  --  1.52* 1.41* 1.08  --   CALCIUM  8.4* 8.4*  --  8.1* 7.7* 8.0*  --   MG 1.7 2.0  --  2.0 1.9  --   --   PHOS  --  1.2* 2.5 2.3* 2.8  --   --    GFR Estimated Creatinine Clearance: 64.9 mL/min (by C-G formula based on SCr of 1.08 mg/dL). Liver Function Tests: Recent Labs  Lab 11/10/23 1934 11/11/23 2016 11/12/23 0434  AST 28 29 28   ALT 20 18 20   ALKPHOS 56 66 69  BILITOT 1.2 1.2 1.4*  PROT 6.7 6.8 7.0  ALBUMIN 3.4* 3.0* 3.0*   No results for input(s): LIPASE, AMYLASE in the last 168 hours. No results for input(s): AMMONIA in the last 168 hours. Coagulation profile No results for input(s): INR, PROTIME in the last 168 hours.  CBC: Recent Labs  Lab 11/11/23 0749 11/12/23 0434 11/13/23 0321 11/14/23 0319 11/15/23 0326  WBC 14.3* 14.3* 19.3* 12.5* 10.8*  HGB 12.6* 12.9* 12.2* 11.9* 13.1  HCT 40.7 41.0 37.1* 35.0* 39.5  MCV 102.3* 102.5* 99.2 96.7 97.3  PLT 267 269 249 245 245   Cardiac Enzymes: Recent Labs  Lab 11/11/23 2159  CKTOTAL 72   BNP (last 3 results) No results for input(s): PROBNP in the last 8760 hours. CBG: Recent Labs  Lab 11/16/23 1806 11/16/23 2205 11/17/23 0814 11/17/23 1023 11/17/23 1338  GLUCAP 125* 188* 282* 270* 218*   D-Dimer: No results for input(s): DDIMER in the last 72 hours. Hgb A1c: No results for input(s): HGBA1C in the last 72 hours. Lipid Profile: No results for input(s): CHOL, HDL, LDLCALC, TRIG, CHOLHDL, LDLDIRECT in the last 72 hours.  Thyroid  function studies: No results for input(s): TSH, T4TOTAL, T3FREE, THYROIDAB in the last 72 hours.  Invalid input(s): FREET3 Anemia work up: No results for input(s): VITAMINB12, FOLATE, FERRITIN, TIBC, IRON , RETICCTPCT in the last 72 hours. Sepsis  Labs: Recent Labs  Lab 11/11/23 2016 11/11/23 2244 11/12/23 0434 11/13/23 0321 11/14/23 0319 11/15/23 0326  WBC  --   --  14.3* 19.3* 12.5* 10.8*  LATICACIDVEN 1.4 1.2  --   --   --   --     Microbiology Recent Results (from the past 240 hours)  Resp panel by RT-PCR (RSV, Flu A&B, Covid) Anterior Nasal Swab     Status: None   Collection Time: 11/10/23 10:55 AM   Specimen: Anterior Nasal Swab  Result Value Ref Range Status   SARS Coronavirus 2 by RT PCR NEGATIVE NEGATIVE Final   Influenza A by PCR NEGATIVE NEGATIVE Final   Influenza B by PCR NEGATIVE NEGATIVE Final    Comment: (NOTE) The Xpert Xpress SARS-CoV-2/FLU/RSV plus assay is intended as an aid in the diagnosis of influenza from Nasopharyngeal swab specimens and should not be used as a sole basis for treatment. Nasal washings and aspirates are unacceptable for Xpert Xpress SARS-CoV-2/FLU/RSV testing.  Fact Sheet for Patients: BloggerCourse.com  Fact Sheet for Healthcare Providers: SeriousBroker.it  This test is not yet approved or cleared by the [place] FDA and has been authorized for detection and/or diagnosis of SARS-CoV-2 by FDA under an Emergency Use Authorization (EUA). This EUA will remain in effect (meaning this test can be used) for the duration of the COVID-19 declaration under Section 564(b)(1) of the Act, 21 U.S.C. section 360bbb-3(b)(1), unless the authorization is terminated or revoked.     Resp Syncytial Virus by PCR NEGATIVE NEGATIVE Final    Comment: (NOTE) Fact Sheet for Patients: BloggerCourse.com  Fact Sheet for Healthcare Providers: SeriousBroker.it  This test is not yet approved or cleared by the [place]  FDA and has been authorized for detection and/or diagnosis of SARS-CoV-2 by FDA under an Emergency Use Authorization (EUA). This EUA will remain in effect (meaning this test  can be used) for the duration of the COVID-19 declaration under Section 564(b)(1) of the Act, 21 U.S.C. section 360bbb-3(b)(1), unless the authorization is terminated or revoked.  Performed at [facility], [address], [place], [place] [address]     Procedures and diagnostic studies:  CARDIAC CATHETERIZATION Result Date: 11/17/2023   Prox LAD-2 lesion is 65% stenosed.   Mid Cx lesion is 50% stenosed.   Ost 1st Diag lesion is 100% stenosed.   Ost Ramus to Ramus lesion is 60% stenosed.   Prox LAD-1 lesion is 65% stenosed.   Non-stenotic Mid RCA lesion was previously treated.   Non-stenotic Prox RCA lesion was previously treated.   Non-stenotic Ost RPDA lesion was previously treated.   Non-stenotic Ost RPDA to RPDA lesion was previously treated.   Non-stenotic RPAV lesion was previously treated. 1.  Widely patent RCA stents with no significant restenosis.  Patent LAD stent with borderline significant in-stent restenosis.  Occluded diagonal stent is new compared to most recent catheterization.  Stable moderate left circumflex and ramus disease. 2.  Left ventricular angiography was not performed.  EF was moderately reduced by echo.  Normal left ventricular end-diastolic pressure. Recommendations: Non-STEMI could have been related to diagonal stent occlusion versus supply/demand ischemia in the setting of underlying coronary artery disease.  The diagonal gets collaterals from the RCA. Recommend medical therapy for now. If the patient has refractory angina, FFR guided PCI to the LAD can be considered.   DG Abd 1 View Result Date: 11/16/2023 CLINICAL DATA:  Abdomen pain constipation EXAM: ABDOMEN - 1 VIEW COMPARISON:  CT 03/15/2022 FINDINGS: Surgical clips in the right upper quadrant. Nonobstructed gas pattern with moderate stool in the colon. Hardware in the lower lumbar spine. Vascular calcifications. IMPRESSION: Nonobstructed gas pattern with moderate stool in the colon. Electronically Signed    By: Luke Bun M.D.   On: 11/16/2023 17:05                LOS: 6 days   Nicholis Stepanek  [facility]   Pager on [email or web address]. If 7PM-7AM, please contact night-coverage at [email or web address]     11/17/2023, 2:23 PM

## 2023-11-17 NOTE — Progress Notes (Signed)
 Heart Failure Stewardship Pharmacy Note  PCP: Arloa Elsie SAUNDERS, MD PCP-Cardiologist: Oneil Parchment, MD  HPI: [name] is a 78 y.o. male with adrenal insufficiency, chronic kidney disease stage III, heart failure with reduced ejection fraction, coronary artery disease, PAD, insulin -dependent diabetes, chronic hearing loss, hyperlipidemia, hypothyroidism, iron  deficiency anemia, lumbar degenerative disc disease with radiculopathy, chronic eczema, history of Charcot foot of the left, history of diabetic neuropathy, obstructive sleep apnea and sarcoidosis, essential hypertension who presented with suicidal ideation. On admission, BNP was 838.2, AM cortisol 13.7, UDS negative, lactate 1.4, HS-troponin was 1170 > 992 > 753, and TSH 3.163. On 11/10/23, patient became hypoxic and experienced vomiting. Chest x-ray noted bilateral lower lobe airspace opacities, right greater than left concerning for pneumonia suspected to be aspiration PNA.   Pertinent cardiac history: CAD with DES to LAD and PLA in 2011, DESx2 right posterior dedscending artery in 08/2018, DES to pRCA mRCA and 1st diag in 01/2021. Most recent LHC 08/2021 at [facility] showed nonobstructive CAD and no interventions were performed. TTE 11/2023 with newly reduced LVEF to 35-40% with G1DD. LHC 11/2023 showed occluded diagonal stent.  Pertinent Lab Values: Creat  Date Value Ref Range Status  01/09/2020 2.17 (H) 0.70 - 1.18 mg/dL Final    Comment:    For patients >72 years of age, the reference limit for Creatinine is approximately 13% higher for people identified as African-American. .    Creatinine, Ser  Date Value Ref Range Status  11/15/2023 1.08 0.61 - 1.24 mg/dL Final   BUN  Date Value Ref Range Status  11/15/2023 22 8 - 23 mg/dL Final  89/75/7976 19 8 - 27 mg/dL Final   Potassium  Date Value Ref Range Status  11/15/2023 4.1 3.5 - 5.1 mmol/L Final   Sodium  Date Value Ref Range Status  11/15/2023 141 135 - 145 mmol/L Final   12/28/2021 142 134 - 144 mmol/L Final   B Natriuretic Peptide  Date Value Ref Range Status  11/11/2023 838.2 (H) 0.0 - 100.0 pg/mL Final    Comment:    Performed at [facility], [address] [place], [place] [address]   Magnesium   Date Value Ref Range Status  11/14/2023 1.9 1.7 - 2.4 mg/dL Final    Comment:    Performed at [facility], [address] [place], [place] [address]   Hgb A1c MFr Bld  Date Value Ref Range Status  10/09/2023 10.6 (H) 4.8 - 5.6 % Final    Comment:    (NOTE)         Prediabetes: 5.7 - 6.4         Diabetes: >6.4         Glycemic control for adults with diabetes: <7.0    TSH  Date Value Ref Range Status  11/11/2023 3.163 0.350 - 4.500 uIU/mL Final    Comment:    Performed by a 3rd Generation assay with a functional sensitivity of <=0.01 uIU/mL. Performed at [facility], [address] [place], [place] [address]   07/27/2018 6.200 (H) 0.450 - 4.500 uIU/mL Final   LDH  Date Value Ref Range Status  11/13/2019 181 98 - 192 U/L Final    Comment:    Performed at [facility], [address] [place], [place] [address]    Vital Signs:  Temp:  [97.7 F (36.5 C)-98.4 F (36.9 C)] 98.2 F (36.8 C) (09/12 1138) Pulse Rate:  [64-80] 66 (09/12 1200) Cardiac Rhythm: Normal sinus  rhythm (09/12 1200) Resp:  [14-19] 16 (09/12 1200) BP: (115-132)/(50-87) 132/54 (09/12 1200) SpO2:  [95 %-97 %] 97 % (09/12 1200)  Intake/Output Summary (Last 24 hours) at 11/17/2023 1210 Last data filed at 11/16/2023 1900 Gross per 24 hour  Intake 480 ml  Output --  Net 480 ml   Current Heart Failure Medications:  Loop diuretic: none Beta-Blocker: metoprolol  succinate 25 mg daily ACEI/ARB/ARNI: Entresto  24-26 mg BID MRA: none SGLT2i: none Other: hydralazine  10 mg BID  Prior to admission Heart Failure Medications:  Loop diuretic: furosemide  80 mg prn Beta-Blocker: none ACEI/ARB/ARNI: losartan  100 mg  daily MRA: none SGLT2i: none Other: hydralazine  10 mg BID, amlodipine   Assessment: 1. Acute combined systolic and diastolic heart failure (LVEF 35-40%) with G1DD, due to ICM. NYHA class II-III symptoms.  -Symptoms: Reports feeling well overall. Underwent LHC today showing occluded diagonal.  -Volume: Appears to be euvolemic. Creatinine has returned to baseline, though not checked today. No indication for loop diuretics at this time.  -Hemodynamics: BP is elevated today and HR 70-80s.  -BB: Continue metoprolol  succinate 25 mg daily.   -ACEI/ARB/ARNI: Currently on Entresto  24-26 mg BID. Would consider increasing if BP stable after adding spironolactone  and Jardiance. -MRA: Continue spironolactone  12.5 mg daily -SGLT2i: WBC trending down after steroid decrease. Would consider adding Jardiance as Jardiance is significantly cheaper for this patient than [place]. -PTA amlodipine  stopped. Would not resume at discharge.  Plan: 1) Medication changes recommended at this time: - Consider starting Jardiance 10 mg daily   2) Patient assistance: -Generic Entresto  copay is $47.00, Doreen copay is $253.82, Jardiance copay is $47.00   3) Education: - Patient has been educated on current HF medications and potential additions to HF medication regimen - Patient verbalizes understanding that over the next few months, these medication doses may change and more medications may be added to optimize HF regimen - Patient has been educated on basic disease state pathophysiology and goals of therapy  Medication Assistance / Insurance Benefits Check: Does the patient have prescription insurance? Prescription Insurance: Medicare  Type of insurance plan:  Does the patient qualify for medication assistance through manufacturers or grants? Pending   Outpatient Pharmacy: Prior to admission outpatient pharmacy: [facility]      Please do not hesitate to reach out with questions or concerns,  Jaun Bash, PharmD, CPP,  BCPS, [facility] Heart Failure Pharmacist  Phone - [phone] 11/17/2023 12:10 PM

## 2023-11-17 NOTE — TOC Progression Note (Signed)
 Transition of Care [facility]) - Progression Note    Patient Details  Name: [name] MRN: [id number] Date of Birth: 1945/05/06  [facility] CM/SW Contact  Corean ONEIDA Haddock, RN Phone Number: 11/17/2023, 10:55 AM  Clinical Narrative:     Plan remains for patient to return home with resumption of home health through [facility] at discharge Cardiac cath today                      Expected Discharge Plan and Services                                               Social Drivers of Health (SDOH) Interventions SDOH Screenings   Food Insecurity: No Food Insecurity (11/12/2023)  Housing: Low Risk  (11/12/2023)  Transportation Needs: No Transportation Needs (11/12/2023)  Utilities: Not At Risk (11/12/2023)  Alcohol Screen: Low Risk  (11/10/2023)  Financial Resource Strain: Low Risk  (02/08/2020)   Received from [facility]  Social Connections: Moderately Integrated (11/12/2023)  Stress: No Stress Concern Present (10/15/2023)   Received from [facility]  Tobacco Use: Low Risk  (11/10/2023)    Readmission Risk Interventions    11/14/2023   11:04 AM 03/18/2022    9:07 AM  Readmission Risk Prevention Plan  Transportation Screening Complete Complete  Medication Review [employer]) Complete Complete  PCP or Specialist appointment within 3-5 days of discharge  Complete  HRI or Home Care Consult Complete Complete  SW Recovery Care/Counseling Consult Complete Complete  Palliative Care Screening  Complete  Skilled Nursing Facility Not Applicable Complete

## 2023-11-17 NOTE — Progress Notes (Signed)
 Physical Therapy Treatment Patient Details Name: [name] MRN: [id number] DOB: [birth year] Today's Date: 11/17/2023   History of Present Illness Pt is a 78 y.o. male with L foot cellulitis s/p recent debridement with Dr. Gunnar at [facility] who was admitted recently to inpatient psychiatric unit with suicide ideation. He was involuntarily committed after suicide attempt for which he took 10 to 60 tablets of tramadol. Rapid response was called on 9/6 when he became hypoxic and pt was transferred to acute medical care for management of the suspected aspiration pneumonia. MD assessment also includes NSTEMI.  PMH of adrenal insufficiency, CKD3, heart failure with reduced ejection fraction EF of 45 to 50%, CAD with lesion to LAD, PAD complex stenting done recently 4 weeks ago, history of beta-blockers insulin -dependent diabetes, chronic hearing loss, HLD, hypothyroidism, iron  deficiency anemia, lumbar degenerative disc disease with radiculopathy, chronic eczema, history of Charcot foot of the left, history of diabetic neuropathy, HTN, OSA, and sarcoidosis    PT Comments  Pt declined mobility this session due to fatigue from cardiac cath procedure but agreed to below therex and put forth good effort throughout the session.  Pt reported no adverse symptoms during the session with SpO2 and HR WNL throughout on room air.  Pt will benefit from continued PT services upon discharge to safely address deficits listed in patient problem list for decreased caregiver assistance and eventual return to PLOF.      If plan is discharge home, recommend the following: A little help with walking and/or transfers;A little help with bathing/dressing/bathroom;Assistance with cooking/housework;Help with stairs or ramp for entrance;Assist for transportation   Can travel by private vehicle        Equipment Recommendations  None recommended by PT    Recommendations for Other Services       Precautions / Restrictions  Precautions Precautions: Fall Recall of Precautions/Restrictions: Intact Restrictions Weight Bearing Restrictions Per Provider Order: Yes Other Position/Activity Restrictions: Per orders ok for pt to stand on LLE but walking not recommended; pt stated that podiatry instructed pt to have boot donned to L foot at all times  R wrist cardiac cath 11/16/23    Mobility  Bed Mobility               General bed mobility comments: Pt declined mobility this session due to fatigue from cardiac cath this date but agreed to below supine therex    Transfers                        Ambulation/Gait                   Stairs             Wheelchair Mobility     Tilt Bed    Modified Rankin (Stroke Patients Only)       Balance                                            Communication Communication Factors Affecting Communication: Hearing impaired  Cognition Arousal: Alert Behavior During Therapy: WFL for tasks assessed/performed   PT - Cognitive impairments: No apparent impairments                         Following commands: Intact      Cueing Cueing Techniques: Verbal cues,  Tactile cues  Exercises Total Joint Exercises Ankle Circles/Pumps: Strengthening, Right, 5 reps, 10 reps (With manual resistance) Quad Sets: Strengthening, Both, 5 reps, 10 reps Gluteal Sets: Strengthening, Both, 5 reps, 10 reps Towel Squeeze: Strengthening, Both, 10 reps Short Arc Quad: Strengthening, Both, 5 reps, 10 reps (With manual resistance) Heel Slides: Strengthening, Right, 10 reps (With manual resistance) Hip ABduction/ADduction: Strengthening, Both, 10 reps (With manual resistance) Straight Leg Raises: AROM, Strengthening, Both, 10 reps Other Exercises Other Exercises: RLE supine leg press with manual resistance x 10 Other Exercises: HEP education for RLE APs and BLE QS and GS x 10 each every 1-2 hours daily    General Comments         Pertinent Vitals/Pain Pain Assessment Pain Assessment: No/denies pain    Home Living                          Prior Function            PT Goals (current goals can now be found in the care plan section) Progress towards PT goals: PT to reassess next treatment    Frequency    Min 2X/week      PT Plan      Co-evaluation              AM-PAC PT 6 Clicks Mobility   Outcome Measure  Help needed turning from your back to your side while in a flat bed without using bedrails?: A Little Help needed moving from lying on your back to sitting on the side of a flat bed without using bedrails?: A Little Help needed moving to and from a bed to a chair (including a wheelchair)?: A Little Help needed standing up from a chair using your arms (e.g., wheelchair or bedside chair)?: A Little Help needed to walk in hospital room?: A Little Help needed climbing 3-5 steps with a railing? : A Little 6 Click Score: 18    End of Session   Activity Tolerance: Patient tolerated treatment well Patient left: in bed;with call bell/phone within reach;with bed alarm set;with family/visitor present Nurse Communication: Mobility status;Weight bearing status PT Visit Diagnosis: Unsteadiness on feet (R26.81);Difficulty in walking, not elsewhere classified (R26.2);Muscle weakness (generalized) (M62.81)     Time: 8391-8371 PT Time Calculation (min) (ACUTE ONLY): 20 min  Charges:    $Therapeutic Exercise: 8-22 mins PT General Charges $$ ACUTE PT VISIT: 1 Visit                     D. Glendia Bertin PT, DPT 11/17/23, 5:21 PM

## 2023-11-17 NOTE — Discharge Summary (Incomplete)
 Physician Discharge Summary   Patient: [name] MRN: [id number] DOB: 16-Mar-1945  Admit date:     11/11/2023  Discharge date: {dischdate:26783}  Discharge Physician: AIDA CHO   PCP: Arloa Elsie SAUNDERS, MD   Recommendations at discharge:  {Tip this will not be part of the note when signed- Example include specific recommendations for outpatient follow-up, pending tests to follow-up on. (Optional):26781}  ***  Discharge Diagnoses: Principal Problem:   Aspiration pneumonia (HCC) Active Problems:   Sarcoidosis   Coronary artery disease with exertional angina (HCC)   GERD   OSA (obstructive sleep apnea)   History of adrenal insufficiency   Pulmonary sarcoidosis (HCC)   Uncontrolled type 2 diabetes mellitus with hyperglycemia, with long-term current use of insulin  (HCC)   Hyperlipidemia   Stage 3a chronic kidney disease (CKD) (HCC)   Suicidal behavior with attempted self-injury (HCC)   Non-ST elevation (NSTEMI) myocardial infarction (HCC)   HFrEF (heart failure with reduced ejection fraction) (HCC)   Pressure injury of skin   Elevated troponin   Cardiomyopathy (HCC)  Resolved Problems:   * No resolved hospital problems. [facility] Course: No notes on file  Assessment and Plan: No notes have been filed under this hospital service. Service: Hospitalist     {Tip this will not be part of the note when signed There is no height or weight on file to calculate BMI. , ,  Active Pressure Injury/Wound(s)     None          (Optional):26781}  {(NOTE) Pain control PDMP Statment (Optional):26782} Consultants: *** Procedures performed: ***  Disposition: {Plan; Disposition:26390} Diet recommendation:  Discharge Diet Orders (From admission, onward)     Start     Ordered   11/17/23 0000  Diet - low sodium heart healthy        11/17/23 1359           {Diet_Plan:26776} DISCHARGE MEDICATION: Allergies as of 11/17/2023       Reactions   Statins Other (See  Comments)   Aggression  Pt ok to take crestor  20mg  once a week   Hydrocortisone  Nausea Only   Ranexa [ranolazine] Other (See Comments)   Severe weakness/near syncope after 1 dose Hallucinations    Doxycycline  Diarrhea   Hydrocodone  Nausea And Vomiting   Miralax  [polyethylene Glycol] Nausea And Vomiting   Z-pak [azithromycin ] Other (See Comments)   Raises blood sugar   Zaroxolyn  [metolazone ] Other (See Comments)   Drained, no energy        Medication List     STOP taking these medications    losartan  100 MG tablet Commonly known as: COZAAR        TAKE these medications    acetaminophen  500 MG tablet Commonly known as: TYLENOL  Take 500-1,000 mg by mouth daily as needed for mild pain (pain score 1-3) (pain).   albuterol  1.25 MG/3ML nebulizer solution Commonly known as: ACCUNEB  INHALE THE CONTENTS OF 1 VIAL VIA NEBULIZER EVERY 8 HOURS AS NEEDED   albuterol  108 (90 Base) MCG/ACT inhaler Commonly known as: VENTOLIN  HFA 2 puffs as needed Inhalation every 6 hrs   amLODipine  5 MG tablet Commonly known as: NORVASC  Take 1 tablet (5 mg total) by mouth at bedtime.   ascorbic acid  500 MG tablet Commonly known as: VITAMIN C  Take 1 tablet (500 mg total) by mouth 2 (two) times daily.   aspirin  EC 81 MG tablet Take 1 tablet (81 mg total) by mouth daily. Swallow whole. Start taking on: November 18, 2023  clopidogrel  75 MG tablet Commonly known as: PLAVIX  Take 1 tablet (75 mg total) by mouth daily. Restart on 09/28/21 What changed:  when to take this additional instructions   Coenzyme Q10 100 MG capsule Take 100 mg by mouth at bedtime.   ezetimibe  10 MG tablet Commonly known as: ZETIA  Take 1 tablet (10 mg total) by mouth at bedtime.   Fish Oil 1000 MG Caps Take 1,000 mg by mouth at bedtime.   furosemide  80 MG tablet Commonly known as: LASIX  Take 80 mg by mouth daily as needed for edema or fluid. Take one tablet (80mg ) by mouth daily as needed   gabapentin  100  MG capsule Commonly known as: NEURONTIN  Take 100 mg by mouth at bedtime as needed (pain).   Gerhardt's butt cream Crea Apply 1 Application topically 2 (two) times daily for 7 days.   hydrALAZINE  10 MG tablet Commonly known as: APRESOLINE  Take 10 mg by mouth 2 (two) times daily.   isosorbide  mononitrate 30 MG 24 hr tablet Commonly known as: IMDUR  Take 1 tablet (30 mg total) by mouth daily.   levothyroxine  75 MCG tablet Commonly known as: SYNTHROID  Take 75 mcg by mouth in the morning.   metoprolol  succinate 25 MG 24 hr tablet Commonly known as: TOPROL -XL Take 1 tablet (25 mg total) by mouth daily.   montelukast  10 MG tablet Commonly known as: SINGULAIR  Take 10 mg by mouth daily as needed (allergies).   multivitamin with minerals Tabs tablet Take 1 tablet by mouth at bedtime.   nitroGLYCERIN  0.4 MG SL tablet Commonly known as: NITROSTAT  Place 0.4 mg under the tongue every 5 (five) minutes as needed for chest pain.   NovoLIN 70/30 (70-30) 100 UNIT/ML injection Generic drug: insulin  NPH-regular Human Inject 30-40 Units into the skin.   pantoprazole  40 MG tablet Commonly known as: PROTONIX  Take 40 mg by mouth at bedtime.   potassium chloride  10 MEQ CR capsule Commonly known as: MICRO-K  Take 20 mEq by mouth at bedtime.   predniSONE  10 MG tablet Commonly known as: DELTASONE  Take 10 mg by mouth at bedtime. Continuous course.   rosuvastatin  5 MG tablet Commonly known as: CRESTOR  Take 5 mg by mouth at bedtime.   sacubitril -valsartan  24-26 MG Commonly known as: ENTRESTO  Take 1 tablet by mouth 2 (two) times daily.   spironolactone  25 MG tablet Commonly known as: ALDACTONE  Take 0.5 tablets (12.5 mg total) by mouth daily.   zinc  sulfate (50mg  elemental zinc ) 220 (50 Zn) MG capsule Take 1 capsule (220 mg total) by mouth at bedtime.               Discharge Care Instructions  (From admission, onward)           Start     Ordered   11/17/23 0000  Discharge  wound care:       Comments: Wound care  Every day Comments: Cleanse perineal and scrotal area with soap and water  and pat dry. Apply Gerhardts paste to scrotum twice daily and PRN soilage. May cover scrotum with vaseline gauze and kerlix if needed.   11/17/23 1359            Follow-up Information     [facility]. Go on 11/24/2023.   Specialty: Cardiology Why: Hospital Follow-Up 11/24/23 @ 10:30 Please bring all medciations to follow-up appointment [facility], [address] Second Floor Free Valet Parking at the Advertising account planner information: [address] [place]  [address]  [phone]               Discharge Exam: There were no vitals filed for this visit. ***  Condition at discharge: {DC Condition:26389}  The results of significant diagnostics from this hospitalization (including imaging, microbiology, ancillary and laboratory) are listed below for reference.   Imaging Studies: CARDIAC CATHETERIZATION Result Date: 11/17/2023   Prox LAD-2 lesion is 65% stenosed.   Mid Cx lesion is 50% stenosed.   Ost 1st Diag lesion is 100% stenosed.   Ost Ramus to Ramus lesion is 60% stenosed.   Prox LAD-1 lesion is 65% stenosed.   Non-stenotic Mid RCA lesion was previously treated.   Non-stenotic Prox RCA lesion was previously treated.   Non-stenotic Ost RPDA lesion was previously treated.   Non-stenotic Ost RPDA to RPDA lesion was previously treated.   Non-stenotic RPAV lesion was previously treated. 1.  Widely patent RCA stents with no significant restenosis.  Patent LAD stent with borderline significant in-stent restenosis.  Occluded diagonal stent is new compared to most recent catheterization.  Stable moderate left circumflex and ramus disease. 2.  Left ventricular angiography was not performed.  EF was moderately reduced by echo.  Normal left ventricular end-diastolic pressure. Recommendations: Non-STEMI  could have been related to diagonal stent occlusion versus supply/demand ischemia in the setting of underlying coronary artery disease.  The diagonal gets collaterals from the RCA. Recommend medical therapy for now. If the patient has refractory angina, FFR guided PCI to the LAD can be considered.   DG Abd 1 View Result Date: 11/16/2023 CLINICAL DATA:  Abdomen pain constipation EXAM: ABDOMEN - 1 VIEW COMPARISON:  CT 03/15/2022 FINDINGS: Surgical clips in the right upper quadrant. Nonobstructed gas pattern with moderate stool in the colon. Hardware in the lower lumbar spine. Vascular calcifications. IMPRESSION: Nonobstructed gas pattern with moderate stool in the colon. Electronically Signed   By: Luke Bun M.D.   On: 11/16/2023 17:05   CT HEAD WO CONTRAST ( ) Result Date: 11/13/2023 EXAM: CT HEAD WITHOUT CONTRAST 11/13/2023 06:45:29 PM TECHNIQUE: CT of the head was performed without the administration of intravenous contrast. Automated exposure control, iterative reconstruction, and/or weight based adjustment of the mA/kV was utilized to reduce the radiation dose to as low as reasonably achievable. COMPARISON: CT head 11/11/23 CLINICAL HISTORY: Fall, head injury on Heparin  gtt. FINDINGS: BRAIN AND VENTRICLES: No acute hemorrhage. No evidence of acute infarct. No hydrocephalus. No extra-axial collection. No mass effect or midline shift. Nonspecific hypoattenuation in the periventricular and subcortical white matter, most likely representing chronic small vessel disease. ORBITS: Bilateral lens replacement. SINUSES: Small bilateral mastoid effusions similar to prior. Mucosal thickening in the right sphenoid sinus slightly increased from prior. SOFT TISSUES AND SKULL: No acute soft tissue abnormality. No skull fracture. VASCULATURE: Atherosclerosis involving the carotid siphons and intracranial vertebral arteries. IMPRESSION: 1. No acute intracranial abnormality related to the fall and head injury. 2. Mild  chronic small vessel disease. 3. Mucosal thickening in the right sphenoid sinus, slightly increased from prior. Electronically signed by: Donnice Mania MD 11/13/2023 09:11 PM EDT RP Workstation: [id number]   ECHOCARDIOGRAM COMPLETE Result Date: 11/12/2023    ECHOCARDIOGRAM REPORT   Patient Name:   [name] Date of Exam: 11/12/2023 Medical Rec #:  [id number]  Height:       70.0 in Accession #:    [id number] Weight:       200.0 lb Date of Birth:  [birth year]  BSA:  2.087 m Patient Age:    77 years   BP:           134/80 mmHg Patient Gender: M          HR:           91 bpm. Exam Location:  [facility] Procedure: 2D Echo, Cardiac Doppler, Color Doppler and Intracardiac            Opacification Agent (Both Spectral and Color Flow Doppler were            utilized during procedure). Indications:     CHF  History:         Patient has prior history of Echocardiogram examinations, most                  recent 07/13/2022. CAD; Risk Factors:Dyslipidemia, Hypertension,                  Sleep Apnea and Diabetes. CKD STAGE 3.  Sonographer:     Philomena Daring Referring Phys:  [id number] BRENDA MORRISON Diagnosing Phys: Annabella Scarce MD IMPRESSIONS  1. Left ventricular ejection fraction, by estimation, is 35 to 40%. The left ventricle has moderately decreased function. The left ventricle demonstrates regional wall motion abnormalities (see scoring diagram/findings for description). Left ventricular  diastolic parameters are consistent with Grade I diastolic dysfunction (impaired relaxation). Elevated left ventricular end-diastolic pressure.  2. Right ventricular systolic function is normal. The right ventricular size is normal. There is normal pulmonary artery systolic pressure.  3. The mitral valve is normal in structure. No evidence of mitral valve regurgitation. No evidence of mitral stenosis.  4. The aortic valve is tricuspid. There is mild calcification of the aortic valve. There is mild thickening of the aortic valve. Aortic valve  regurgitation is not visualized. No aortic stenosis is present.  5. The inferior vena cava is normal in size with greater than 50% respiratory variability, suggesting right atrial pressure of 3 mmHg. Comparison(s): A prior study was performed on 07/2022. Changes from prior study are noted. Compared with echo 07/2022, systolic dysfunction is new. FINDINGS  Left Ventricle: Left ventricular ejection fraction, by estimation, is 35 to 40%. The left ventricle has moderately decreased function. The left ventricle demonstrates regional wall motion abnormalities. The left ventricular internal cavity size was normal in size. There is no left ventricular hypertrophy. Left ventricular diastolic parameters are consistent with Grade I diastolic dysfunction (impaired relaxation). Elevated left ventricular end-diastolic pressure.  LV Wall Scoring: The entire anterior wall, mid and distal anterior septum, inferior septum, posterior wall, and apex are hypokinetic. The antero-lateral wall, entire inferior wall, basal anteroseptal segment, and apical lateral segment are normal. Right Ventricle: The right ventricular size is normal. No increase in right ventricular wall thickness. Right ventricular systolic function is normal. There is normal pulmonary artery systolic pressure. The tricuspid regurgitant velocity is 1.24 m/s, and  with an assumed right atrial pressure of 3 mmHg, the estimated right ventricular systolic pressure is 9.2 mmHg. Left Atrium: Left atrial size was normal in size. Right Atrium: Right atrial size was normal in size. Pericardium: There is no evidence of pericardial effusion. Mitral Valve: The mitral valve is normal in structure. No evidence of mitral valve regurgitation. No evidence of mitral valve stenosis. Tricuspid Valve: The tricuspid valve is normal in structure. Tricuspid valve regurgitation is trivial. No evidence of tricuspid stenosis. Aortic Valve: The aortic valve is tricuspid. There is mild calcification  of the aortic valve. There is mild thickening of  the aortic valve. Aortic valve regurgitation is not visualized. No aortic stenosis is present. Pulmonic Valve: The pulmonic valve was normal in structure. Pulmonic valve regurgitation is mild. No evidence of pulmonic stenosis. Aorta: The aortic root is normal in size and structure. Venous: The inferior vena cava is normal in size with greater than 50% respiratory variability, suggesting right atrial pressure of 3 mmHg. IAS/Shunts: No atrial level shunt detected by color flow Doppler.  LEFT VENTRICLE PLAX 2D LVIDd:         5.60 cm   Diastology LVIDs:         4.50 cm   LV e' medial:    0.04 cm/s LV PW:         1.20 cm   LV E/e' medial:  15.8 LV IVS:        1.00 cm   LV e' lateral:   0.07 cm/s LVOT diam:     1.80 cm   LV E/e' lateral: 9.7 LV SV:         44 LV SV Index:   21 LVOT Area:     2.54 cm  RIGHT VENTRICLE             IVC RV S prime:     13.90 cm/s  IVC diam: 1.50 cm TAPSE (M-mode): 2.1 cm LEFT ATRIUM             Index        RIGHT ATRIUM           Index LA diam:        3.70 cm 1.77 cm/m   RA Area:     12.80 cm LA Vol (A2C):   45.0 ml 21.56 ml/m  RA Volume:   29.40 ml  14.09 ml/m LA Vol (A4C):   59.7 ml 28.60 ml/m LA Biplane Vol: 56.0 ml 26.83 ml/m  AORTIC VALVE LVOT Vmax:   104.00 cm/s LVOT Vmean:  68.200 cm/s LVOT VTI:    0.171 m  AORTA Ao Root diam: 2.70 cm MITRAL VALVE               TRICUSPID VALVE MV Area (PHT): 5.62 cm    TR Peak grad:   6.2 mmHg MV Decel Time: 135 msec    TR Vmax:        124.00 cm/s MV E velocity: 0.69 cm/s MV A velocity: 97.20 cm/s  SHUNTS MV E/A ratio:  0.01        Systemic VTI:  0.17 m                            Systemic Diam: 1.80 cm Annabella Scarce MD Electronically signed by Annabella Scarce MD Signature Date/Time: 11/12/2023/10:26:41 AM    Final    DG Chest Port 1 View Result Date: 11/12/2023 CLINICAL DATA:  [id number].  Follow-up exam. EXAM: PORTABLE CHEST 1 VIEW COMPARISON:  Portable chest yesterday at 7:06 a.m. FINDINGS: 6:03  a.m. There is patchy airspace disease in both lung bases. Overall aeration is unchanged with remaining lungs clear. Minimal pleural effusions. Stable cardiomegaly. No vascular congestion is seen. Stable mediastinum and osseous structures. IMPRESSION: 1. No significant change in patchy airspace disease in both lung bases. 2. Stable cardiomegaly. 3. Minimal pleural effusions. Electronically Signed   By: Francis Quam M.D.   On: 11/12/2023 07:56   CT Angio Chest Pulmonary Embolism (PE) W or WO Contrast Result Date: 11/12/2023 EXAM: CTA of the Chest with contrast  for PE 11/12/2023 05:42:36 AM TECHNIQUE: CTA of the chest was performed after the administration of intravenous contrast. Multiplanar reformatted images are provided for review. MIP images are provided for review. Automated exposure control, iterative reconstruction, and/or weight based adjustment of the mA/kV was utilized to reduce the radiation dose to as low as reasonably achievable. COMPARISON: CT of the chest dated 08/02/2022. CLINICAL HISTORY: Pulmonary embolism (PE) suspected, low to intermediate prob, positive D-dimer. FINDINGS: PULMONARY ARTERIES: Pulmonary arteries are adequately opacified for evaluation. No pulmonary embolism. Main pulmonary artery is normal in caliber. MEDIASTINUM: The heart and pericardium demonstrate no acute abnormality. There is moderate calcific coronary artery disease. There is mild-to-moderate calcific atheromatous disease within the aortic arch. LYMPH NODES: No mediastinal, hilar or axillary lymphadenopathy. LUNGS AND PLEURA: There are dependent ground-glass opacities within the lungs bilaterally. There are streaky peribronchovascular opacities within the lower lobes bilaterally. There is a mild right pleural effusion and a tiny left effusion. UPPER ABDOMEN: The patient is status post cholecystectomy. SOFT TISSUES AND BONES: No acute bone or soft tissue abnormality. IMPRESSION: 1. No evidence of pulmonary embolism. 2.  Mild right pleural effusion and tiny left effusion. 3. Peribronchial opacities, compatible with bronchitis/bronchiolitis. Electronically signed by: Evalene Coho MD 11/12/2023 05:52 AM EDT RP Workstation: [id number]   CT HEAD WO CONTRAST ( ) Result Date: 11/11/2023 CLINICAL DATA:  Encephalopathy. EXAM: CT HEAD WITHOUT CONTRAST TECHNIQUE: Contiguous axial images were obtained from the base of the skull through the vertex without intravenous contrast. RADIATION DOSE REDUCTION: This exam was performed according to the departmental dose-optimization program which includes automated exposure control, adjustment of the mA and/or kV according to patient size and/or use of iterative reconstruction technique. COMPARISON:  None Available. FINDINGS: Brain: There is no evidence of an acute infarct, intracranial hemorrhage, mass, midline shift, or extra-axial fluid collection. There is mild cerebral atrophy. Cerebral white matter hypodensities are nonspecific but compatible with mild chronic small vessel ischemic disease. Vascular: Calcified atherosclerosis at the skull base. No hyperdense vessel. Skull: No acute fracture or suspicious lesion. Sinuses/Orbits: Small volume secretions in the right sphenoid sinus. Small bilateral mastoid effusions. Bilateral cataract extraction. Other: None. IMPRESSION: 1. No evidence of acute intracranial abnormality. 2. Mild chronic small vessel ischemic disease. Electronically Signed   By: Dasie Hamburg M.D.   On: 11/11/2023 19:05   DG Chest 1 View Result Date: 11/11/2023 CLINICAL DATA:  Congestive heart failure. EXAM: CHEST  1 VIEW COMPARISON:  November 10, 2023. FINDINGS: Stable cardiomegaly. Mild right basilar subsegmental atelectasis or infiltrate is noted. Left lung is clear. Bony thorax is unremarkable. IMPRESSION: Mild right basilar subsegmental atelectasis or infiltrate. Electronically Signed   By: Lynwood Landy Raddle M.D.   On: 11/11/2023 09:10   DG Chest 1 View Result Date:  11/10/2023 CLINICAL DATA:  Pneumonia, vomiting, shortness of breath EXAM: CHEST  1 VIEW COMPARISON:  11/09/2023 FINDINGS: Cardiomegaly. Bilateral lower lobe airspace opacities, right greater than left concerning for pneumonia. No visible effusions. No acute bony abnormality. IMPRESSION: Bilateral lower lobe airspace opacities, right greater than left concerning for pneumonia. This is progressed since prior study. Electronically Signed   By: Franky Crease M.D.   On: 11/10/2023 18:12   DG Chest Portable 1 View Result Date: 11/09/2023 CLINICAL DATA:  Overdose. EXAM: PORTABLE CHEST 1 VIEW COMPARISON:  10/09/2023 FINDINGS: Stable cardiomegaly. Subsegmental atelectasis or scarring. No pulmonary edema, large pleural effusion or pneumothorax. No acute osseous findings. IMPRESSION: Stable cardiomegaly. Subsegmental atelectasis or scarring. Electronically Signed   By: Andrea Gasman  M.D.   On: 11/09/2023 22:54    Microbiology: Results for orders placed or performed during the hospital encounter of 11/09/23  Resp panel by RT-PCR (RSV, Flu A&B, Covid) Anterior Nasal Swab     Status: None   Collection Time: 11/10/23 10:55 AM   Specimen: Anterior Nasal Swab  Result Value Ref Range Status   SARS Coronavirus 2 by RT PCR NEGATIVE NEGATIVE Final   Influenza A by PCR NEGATIVE NEGATIVE Final   Influenza B by PCR NEGATIVE NEGATIVE Final    Comment: (NOTE) The Xpert Xpress SARS-CoV-2/FLU/RSV plus assay is intended as an aid in the diagnosis of influenza from Nasopharyngeal swab specimens and should not be used as a sole basis for treatment. Nasal washings and aspirates are unacceptable for Xpert Xpress SARS-CoV-2/FLU/RSV testing.  Fact Sheet for Patients: BloggerCourse.com  Fact Sheet for Healthcare Providers: SeriousBroker.it  This test is not yet approved or cleared by the [place]  FDA and has been authorized for detection and/or diagnosis of SARS-CoV-2  by FDA under an Emergency Use Authorization (EUA). This EUA will remain in effect (meaning this test can be used) for the duration of the COVID-19 declaration under Section 564(b)(1) of the Act, 21 U.S.C. section 360bbb-3(b)(1), unless the authorization is terminated or revoked.     Resp Syncytial Virus by PCR NEGATIVE NEGATIVE Final    Comment: (NOTE) Fact Sheet for Patients: BloggerCourse.com  Fact Sheet for Healthcare Providers: SeriousBroker.it  This test is not yet approved or cleared by the [place]  FDA and has been authorized for detection and/or diagnosis of SARS-CoV-2 by FDA under an Emergency Use Authorization (EUA). This EUA will remain in effect (meaning this test can be used) for the duration of the COVID-19 declaration under Section 564(b)(1) of the Act, 21 U.S.C. section 360bbb-3(b)(1), unless the authorization is terminated or revoked.  Performed at [facility], [address] [place], [place] [address]     Labs: CBC: Recent Labs  Lab 11/11/23 [phone] 11/12/23 0434 11/13/23 0321 11/14/23 0319 11/15/23 0326  WBC 14.3* 14.3* 19.3* 12.5* 10.8*  HGB 12.6* 12.9* 12.2* 11.9* 13.1  HCT 40.7 41.0 37.1* 35.0* 39.5  MCV 102.3* 102.5* 99.2 96.7 97.3  PLT 267 269 249 245 245   Basic Metabolic Panel: Recent Labs  Lab 11/11/23 2016 11/12/23 0434 11/12/23 1939 11/13/23 0321 11/14/23 0319 11/15/23 0326 11/15/23 1207  NA 139 139  --  131* 136 141  --   K 3.7 3.8  --  3.5 3.2* 4.1  --   CL 97* 96*  --  91* 96* 103  --   CO2 33* 32  --  31 30 29   --   GLUCOSE 261* 237*  --  194* 147* 204* 412*  BUN 18 16  --  26* 31* 22  --   CREATININE 1.38* 1.33*  --  1.52* 1.41* 1.08  --   CALCIUM  8.4* 8.4*  --  8.1* 7.7* 8.0*  --   MG 1.7 2.0  --  2.0 1.9  --   --   PHOS  --  1.2* 2.5 2.3* 2.8  --   --    Liver Function Tests: Recent Labs  Lab 11/10/23 1934 11/11/23 2016 11/12/23 0434  AST 28 29 28   ALT  20 18 20   ALKPHOS 56 66 69  BILITOT 1.2 1.2 1.4*  PROT 6.7 6.8 7.0  ALBUMIN 3.4* 3.0* 3.0*   CBG: Recent Labs  Lab 11/16/23 1806 11/16/23 2205 11/17/23 0814 11/17/23 1023 11/17/23  1338  GLUCAP 125* 188* 282* 270* 218*    Discharge time spent: {LESS THAN/GREATER THAN:26388} 30 minutes.  Signed: AIDA CHO, MD [facility] 11/17/2023

## 2023-11-17 NOTE — Plan of Care (Signed)
  Problem: Education: Goal: Knowledge of General Education information will improve Description: Including pain rating scale, medication(s)/side effects and non-pharmacologic comfort measures Outcome: Progressing   Problem: Health Behavior/Discharge Planning: Goal: Ability to manage health-related needs will improve Outcome: Progressing   Problem: Clinical Measurements: Goal: Ability to maintain clinical measurements within normal limits will improve Outcome: Progressing Goal: Will remain free from infection Outcome: Progressing Goal: Diagnostic test results will improve Outcome: Progressing Goal: Respiratory complications will improve Outcome: Progressing Goal: Cardiovascular complication will be avoided Outcome: Progressing   Problem: Nutrition: Goal: Adequate nutrition will be maintained Outcome: Progressing   Problem: Activity: Goal: Risk for activity intolerance will decrease Outcome: Progressing   Problem: Coping: Goal: Level of anxiety will decrease Outcome: Progressing   Problem: Elimination: Goal: Will not experience complications related to bowel motility Outcome: Progressing Goal: Will not experience complications related to urinary retention Outcome: Progressing   Problem: Skin Integrity: Goal: Risk for impaired skin integrity will decrease Outcome: Progressing   Problem: Activity: Goal: Ability to tolerate increased activity will improve Outcome: Progressing

## 2023-11-17 NOTE — Progress Notes (Signed)
 Rounding Note   Patient Name: [name] Date of Encounter: 11/17/2023  [facility] Cardiologist: Oneil Parchment, MD   Subjective  Cardiac catheterization showed occluded diagonal stent with right to left collaterals.  RCA stents were patent with no significant restenosis.  The LAD had moderate in-stent restenosis.  Medical therapy is recommended for now.    Scheduled Meds:  [MAR Hold] aspirin  EC  81 mg Oral Daily   [MAR Hold] clopidogrel   75 mg Oral Daily   [MAR Hold] ezetimibe   10 mg Oral Daily   free water   500 mL Oral Once   [MAR Hold] Gerhardt's butt cream   Topical BID   [MAR Hold] guaiFENesin   600 mg Oral BID   [MAR Hold] hydrALAZINE   10 mg Oral BID   [MAR Hold] insulin  aspart  0-15 Units Subcutaneous TID WC   [MAR Hold] insulin  aspart protamine- aspart  25 Units Subcutaneous BID WC   [MAR Hold] isosorbide  mononitrate  30 mg Oral Daily   [MAR Hold] levothyroxine   50 mcg Oral Q0600   [MAR Hold] metoprolol  succinate  25 mg Oral Daily   [MAR Hold] pantoprazole   40 mg Oral QHS   [MAR Hold] predniSONE   10 mg Oral QHS   [MAR Hold] rosuvastatin   5 mg Oral QHS   [MAR Hold] sacubitril -valsartan   1 tablet Oral BID   [MAR Hold] senna-docusate  1 tablet Oral QHS   sodium chloride  flush  3 mL Intravenous Q12H   [MAR Hold] spironolactone   12.5 mg Oral Daily   Continuous Infusions:  [START ON 11/18/2023] sodium chloride  250 mL (11/17/23 1048)   sodium chloride      [MAR Hold] promethazine  (PHENERGAN ) injection (IM or IVPB) Stopped (11/14/23 0459)   PRN Meds: sodium chloride , [MAR Hold] acetaminophen  **OR** [MAR Hold] acetaminophen , [MAR Hold] albuterol , fentaNYL , Heparin  (Porcine) in NaCl, heparin  sodium (porcine), iohexol , lidocaine  (PF), [MAR Hold] magnesium  hydroxide, midazolam , [MAR Hold] montelukast , [MAR Hold] nitroGLYCERIN , [MAR Hold] ondansetron  **OR** [MAR Hold] ondansetron  (ZOFRAN ) IV, [MAR Hold] promethazine  (PHENERGAN ) injection (IM or IVPB), sodium chloride  flush,  [MAR Hold] traZODone , verapamil    Vital Signs  Vitals:   11/17/23 1017 11/17/23 1138 11/17/23 1145 11/17/23 1200  BP: 123/87 (!) 117/59 (!) 128/54 (!) 132/54  Pulse:  64 67 66  Resp: 19 18  16   Temp: 98.4 F (36.9 C) 98.2 F (36.8 C)    TempSrc: Oral Oral    SpO2: 95% 95% 96% 97%    Intake/Output Summary (Last 24 hours) at 11/17/2023 1229 Last data filed at 11/16/2023 1900 Gross per 24 hour  Intake 480 ml  Output --  Net 480 ml      11/10/2023    7:37 PM 11/09/2023    4:48 PM 10/09/2023   11:22 PM  Last 3 Weights  Weight (lbs)  200 lb 200 lb 1.6 oz  Weight (kg)  90.719 kg 90.765 kg     Information is confidential and restricted. Go to Review Flowsheets to unlock data.      Telemetry Normal sinus rhythm- Personally Reviewed  ECG   - Personally Reviewed  Physical Exam  GEN: No acute distress.   Neck: No JVD Cardiac: RRR, no murmurs, rubs, or gallops.  Respiratory: Clear to auscultation bilaterally. GI: Soft, nontender, non-distended  MS: No edema; No deformity. Neuro:  Nonfocal  Psych: Normal affect   Labs High Sensitivity Troponin:   Recent Labs  Lab 11/11/23 2016 11/11/23 2244 11/12/23 0434  TROPONINIHS 1,170* 992* 753*     Chemistry  Recent Labs  Lab 11/10/23 1934 11/11/23 0749 11/11/23 2016 11/12/23 0434 11/13/23 0321 11/14/23 0319 11/15/23 0326 11/15/23 1207  NA 137   < > 139 139 131* 136 141  --   K 4.0   < > 3.7 3.8 3.5 3.2* 4.1  --   CL 98   < > 97* 96* 91* 96* 103  --   CO2 29   < > 33* 32 31 30 29   --   GLUCOSE 256*   < > 261* 237* 194* 147* 204* 412*  BUN 22   < > 18 16 26* 31* 22  --   CREATININE 1.49*   < > 1.38* 1.33* 1.52* 1.41* 1.08  --   CALCIUM  8.9   < > 8.4* 8.4* 8.1* 7.7* 8.0*  --   MG  --    < > 1.7 2.0 2.0 1.9  --   --   PROT 6.7  --  6.8 7.0  --   --   --   --   ALBUMIN 3.4*  --  3.0* 3.0*  --   --   --   --   AST 28  --  29 28  --   --   --   --   ALT 20  --  18 20  --   --   --   --   ALKPHOS 56  --  66 69  --   --   --    --   BILITOT 1.2  --  1.2 1.4*  --   --   --   --   GFRNONAA 48*   < > 53* 55* 47* 51* >60  --   ANIONGAP 10   < > 9 11 9 10 9   --    < > = values in this interval not displayed.    Lipids  Recent Labs  Lab 11/13/23 0321  CHOL 165  TRIG 117  HDL 34*  LDLCALC 108*  CHOLHDL 4.9    Hematology Recent Labs  Lab 11/13/23 0321 11/14/23 0319 11/15/23 0326  WBC 19.3* 12.5* 10.8*  RBC 3.74* 3.62* 4.06*  HGB 12.2* 11.9* 13.1  HCT 37.1* 35.0* 39.5  MCV 99.2 96.7 97.3  MCH 32.6 32.9 32.3  MCHC 32.9 34.0 33.2  RDW 13.2 13.2 13.3  PLT 249 245 245   Thyroid   Recent Labs  Lab 11/11/23 0749  TSH 3.163    BNP Recent Labs  Lab 11/11/23 2016  BNP 838.2*    DDimer  Recent Labs  Lab 11/11/23 2016  DDIMER 1.48*     Radiology  CARDIAC CATHETERIZATION Result Date: 11/17/2023   Prox LAD-2 lesion is 65% stenosed.   Mid Cx lesion is 50% stenosed.   Ost 1st Diag lesion is 100% stenosed.   Ost Ramus to Ramus lesion is 60% stenosed.   Prox LAD-1 lesion is 65% stenosed.   Non-stenotic Mid RCA lesion was previously treated.   Non-stenotic Prox RCA lesion was previously treated.   Non-stenotic Ost RPDA lesion was previously treated.   Non-stenotic Ost RPDA to RPDA lesion was previously treated.   Non-stenotic RPAV lesion was previously treated. 1.  Widely patent RCA stents with no significant restenosis.  Patent LAD stent with borderline significant in-stent restenosis.  Occluded diagonal stent is new compared to most recent catheterization.  Stable moderate left circumflex and ramus disease. 2.  Left ventricular angiography was not performed.  EF was moderately reduced by echo.  Normal left  ventricular end-diastolic pressure. Recommendations: Non-STEMI could have been related to diagonal stent occlusion versus supply/demand ischemia in the setting of underlying coronary artery disease.  The diagonal gets collaterals from the RCA. Recommend medical therapy for now. If the patient has refractory  angina, FFR guided PCI to the LAD can be considered.   DG Abd 1 View Result Date: 11/16/2023 CLINICAL DATA:  Abdomen pain constipation EXAM: ABDOMEN - 1 VIEW COMPARISON:  CT 03/15/2022 FINDINGS: Surgical clips in the right upper quadrant. Nonobstructed gas pattern with moderate stool in the colon. Hardware in the lower lumbar spine. Vascular calcifications. IMPRESSION: Nonobstructed gas pattern with moderate stool in the colon. Electronically Signed   By: Luke Bun M.D.   On: 11/16/2023 17:05    Cardiac Studies   Patient Profile   78 y.o. male  with a hx of CAD s/p multiple PCIs , HFmrEF, pulmonary sarcoidosis, HNT, PAD, HLD, adrenal insufficiency on chronic prednisone , chronic hearing loss, anemia with prior GI bleed, CKD stage who is being seen for ongoing management of NSTEMI.   Assessment & Plan  Non-STEMI - November 09, 2023 overdosed on undetermined number of tramadol Had symptoms of hypoxia, tachycardia, hypertensive,  kidney disease -Echocardiogram with ejection fraction 35 to 40% with regional wall motion abnormality anterior wall, mid and distal anterior septum, inferior septum,  posterior wall, and apex are hypokinetic.  - EF down from 55 to 60% in May 2024 -Troponin greater than 1100 at peak - Cardiac catheterization showed occluded diagonal stent with right to left collaterals.  Recommend medical therapy for now.  He does not have borderline restenosis in the proximal LAD stent and this will be managed medically for now.  If he has refractory angina, FFR guided PCI can be considered.   Cardiomyopathy,  ischemic On Entresto , metoprolol  succinate, spironolactone , Imdur  - Consider addition of SGLT2 inhibitor as an outpatient. His LVEDP was normal and no need for loop diuretic.   Suicidal ideation/attempt Tramadol overdose on arrival September 4 Seen by psychiatry, diagnosed with unintentional drug overdose, adjustment disorder   Hypoxia/fever Found to have  bronchitis/pneumonia CT chest with peribronchial opacities compatible with bronchitis   Mechanical fall November 13, 2023, CT head negative Gait instability exacerbated by left foot wound from diabetic ulcer, status post debridement October 18, 2023, completed Keflex   PAD Stent to left lower extremity October 17, 2023 at [facility] discharged on aspirin  Plavix   For questions or updates, please contact [facility] Please consult [email or web address] for contact info under     Signed, Deatrice Cage, MD  11/17/2023, 12:29 PM

## 2023-11-17 NOTE — Progress Notes (Signed)
 OT Cancellation Note  Patient Details Name: DARCY CORDNER MRN: [id number] DOB: 07-09-1945   Cancelled Treatment:    Reason Eval/Treat Not Completed: Patient at procedure or test/ unavailable. Pt off unit for cardiac cath, OT will follow up and see as appropriate.  Nyheim Seufert L. Kehlani Vancamp, OTR/L  11/17/23, 10:33 AM

## 2023-11-18 DIAGNOSIS — J690 Pneumonitis due to inhalation of food and vomit: Secondary | ICD-10-CM | POA: Diagnosis not present

## 2023-11-18 LAB — GLUCOSE, CAPILLARY: Glucose-Capillary: 295 mg/dL — ABNORMAL HIGH (ref 70–99)

## 2023-11-18 NOTE — Progress Notes (Signed)
 Patient and wife given d/c instructions. Patient and wife aware of medicine changes, follow up appts. PIVs removed, patient transported to personal vehicle and helped into car.

## 2023-11-18 NOTE — Discharge Summary (Signed)
 Physician Discharge Summary   Patient: [name] MRN: [id number] DOB: [birth year]  Admit date:     11/11/2023  Discharge date: 11/18/23  Discharge Physician: AIDA CHO   PCP: Arloa Elsie SAUNDERS, MD   Recommendations at discharge:   Follow-up with PCP in 1 week Follow-up with cardiologist as scheduled  Discharge Diagnoses: Principal Problem:   Aspiration pneumonia (HCC) Active Problems:   Sarcoidosis   Coronary artery disease with exertional angina (HCC)   GERD   OSA (obstructive sleep apnea)   History of adrenal insufficiency   Pulmonary sarcoidosis (HCC)   Uncontrolled type 2 diabetes mellitus with hyperglycemia, with long-term current use of insulin  (HCC)   Hyperlipidemia   Stage 3a chronic kidney disease (CKD) (HCC)   Suicidal behavior with attempted self-injury (HCC)   Non-ST elevation (NSTEMI) myocardial infarction (HCC)   HFrEF (heart failure with reduced ejection fraction) (HCC)   Pressure injury of skin   Elevated troponin   Cardiomyopathy (HCC)  Resolved Problems:   * No resolved hospital problems. *  Hospital Course:  [name] is a 78 y.o. male with medical history significant of adrenal insufficiency, chronic kidney disease stage III, heart failure with midrange ejection fraction EF of 45 to 50%, coronary artery disease with lesion to LAD, PAD complex stenting done recently 4 weeks prior to admission, history of beta-blockers insulin -dependent diabetes, chronic hearing loss, hyperlipidemia, hypothyroidism, iron  deficiency anemia, lumbar degenerative disc disease with radiculopathy, chronic eczema, history of Charcot foot of the left, history of diabetic neuropathy, obstructive sleep apnea, sarcoidosis, essential hypertension.  He presented to [facility] ED on 11/09/2023 and was subsequently admitted to [facility] behavioral health unit on 11/11/2023 because of suicidal ideation.  Apparently, he had taken about 10 to 60 tablets of tramadol.  He was placed under  involuntary commitment.     On 11/10/2023, rapid response was called at the [facility] psychiatric unit because of hypoxia and fever.  He was found to have pneumonia.   Assessment and Plan:  Reported suicidal attempt versus unintentional overdose: Apparently, he had taken about 10 to 60 tablets of tramadol prior to admission. He has been cleared by psychiatrist for discharge.     Pneumonia, probable aspiration: Chest x-ray with bilateral lower airspace opacities.  Completed Levaquin  on 11/15/2023.       Acute NSTEMI, history of CAD and PVD: Troponin 1,170, 992, 753. S/p left heart cath on 11/17/2023. Medical therapy was recommended.  Continue aspirin , Plavix  and Crestor . Recent stent to left lower extremity on 10/17/2023 at [facility] health and was discharged on aspirin  and Plavix  Recent left foot wound debridement for diabetic foot ulcer on 10/18/2023 and has completed Keflex.     Left heart cath report as follows:     Prox LAD-2 lesion is 65% stenosed.   Mid Cx lesion is 50% stenosed.   Ost 1st Diag lesion is 100% stenosed.   Ost Ramus to Ramus lesion is 60% stenosed.   Prox LAD-1 lesion is 65% stenosed.   Non-stenotic Mid RCA lesion was previously treated.   Non-stenotic Prox RCA lesion was previously treated.   Non-stenotic Ost RPDA lesion was previously treated.   Non-stenotic Ost RPDA to RPDA lesion was previously treated.   Non-stenotic RPAV lesion was previously treated.   1.  Widely patent RCA stents with no significant restenosis.  Patent LAD stent with borderline significant in-stent restenosis.  Occluded diagonal stent is new compared to most recent catheterization.  Stable moderate left circumflex and ramus disease.  2.  Left ventricular angiography was not performed.  EF was moderately reduced by echo.  Normal left ventricular end-diastolic pressure.   Recommendations: Non-STEMI could have been related to diagonal stent occlusion versus supply/demand ischemia in the setting of  underlying coronary artery disease.  The diagonal gets collaterals from the RCA. Recommend medical therapy for now. If the patient has refractory angina, FFR guided PCI to the LAD can be considered.     HFrEF, probably acute: 2D echo showed EF estimated at 35 to 45%. Continue Entresto , spironolactone , Toprol -XL and isosorbide  mononitrate Previous 2D echo in August 2025 showed EF estimated at 45 to 50%. History of chronic HFpEF     Type II DM with hyperglycemia, hypoglycemia: Glucose levels are stable, little on the high side.  Continue NovoLog  Mix 5 units twice daily and NovoLog  as needed for hyperglycemia.  Monitor glucose closely and adjust insulin  accordingly. He said he takes 25 to 40 units of NovoLog  mix 2 times or 3 times a day depending on his glucose levels and how many times he eats in a day.     Constipation: Improved status post manual disimpaction.  Continue laxatives as needed.  No acute abnormality or obstruction on abdominal x-ray but there was evidence of moderate stool burden.      S/p mechanical fall on 11/13/2023.  CT head negative. PT recommended home health therapy.     Comorbidities include adrenal insufficiency and sarcoidosis on prednisone , CKD stage IIIa, hypertension, hyperlipidemia, normocytic anemia, hypothyroidism     He feels better and he is deemed stable for discharge to home today.  Discharge plan was discussed with patient and wife at the bedside.         Consultants: Cardiologist, psychiatrist Procedures performed: Left heart cath Disposition: Home Diet recommendation:  Discharge Diet Orders (From admission, onward)     Start     Ordered   11/17/23 0000  Diet - low sodium heart healthy        11/17/23 1359           Cardiac and Carb modified diet DISCHARGE MEDICATION: Allergies as of 11/18/2023       Reactions   Statins Other (See Comments)   Aggression  Pt ok to take crestor  20mg  once a week   Hydrocortisone  Nausea Only    Ranexa [ranolazine] Other (See Comments)   Severe weakness/near syncope after 1 dose Hallucinations    Doxycycline  Diarrhea   Hydrocodone  Nausea And Vomiting   Miralax  [polyethylene Glycol] Nausea And Vomiting   Z-pak [azithromycin ] Other (See Comments)   Raises blood sugar   Zaroxolyn  [metolazone ] Other (See Comments)   Drained, no energy        Medication List     STOP taking these medications    losartan  100 MG tablet Commonly known as: COZAAR        TAKE these medications    acetaminophen  500 MG tablet Commonly known as: TYLENOL  Take 500-1,000 mg by mouth daily as needed for mild pain (pain score 1-3) (pain).   albuterol  1.25 MG/3ML nebulizer solution Commonly known as: ACCUNEB  INHALE THE CONTENTS OF 1 VIAL VIA NEBULIZER EVERY 8 HOURS AS NEEDED   albuterol  108 (90 Base) MCG/ACT inhaler Commonly known as: VENTOLIN  HFA 2 puffs as needed Inhalation every 6 hrs   amLODipine  5 MG tablet Commonly known as: NORVASC  Take 1 tablet (5 mg total) by mouth at bedtime.   ascorbic acid  500 MG tablet Commonly known as: VITAMIN C  Take 1 tablet (500 mg  total) by mouth 2 (two) times daily.   aspirin  EC 81 MG tablet Take 1 tablet (81 mg total) by mouth daily. Swallow whole.   clopidogrel  75 MG tablet Commonly known as: PLAVIX  Take 1 tablet (75 mg total) by mouth daily. Restart on 09/28/21 What changed:  when to take this additional instructions   Coenzyme Q10 100 MG capsule Take 100 mg by mouth at bedtime.   ezetimibe  10 MG tablet Commonly known as: ZETIA  Take 1 tablet (10 mg total) by mouth at bedtime.   Fish Oil 1000 MG Caps Take 1,000 mg by mouth at bedtime.   furosemide  80 MG tablet Commonly known as: LASIX  Take 80 mg by mouth daily as needed for edema or fluid. Take one tablet (80mg ) by mouth daily as needed   gabapentin  100 MG capsule Commonly known as: NEURONTIN  Take 100 mg by mouth at bedtime as needed (pain).   Gerhardt's butt cream Crea Apply 1  Application topically 2 (two) times daily for 7 days.   hydrALAZINE  10 MG tablet Commonly known as: APRESOLINE  Take 10 mg by mouth 2 (two) times daily.   isosorbide  mononitrate 30 MG 24 hr tablet Commonly known as: IMDUR  Take 1 tablet (30 mg total) by mouth daily.   levothyroxine  75 MCG tablet Commonly known as: SYNTHROID  Take 75 mcg by mouth in the morning.   metoprolol  succinate 25 MG 24 hr tablet Commonly known as: TOPROL -XL Take 1 tablet (25 mg total) by mouth daily.   montelukast  10 MG tablet Commonly known as: SINGULAIR  Take 10 mg by mouth daily as needed (allergies).   multivitamin with minerals Tabs tablet Take 1 tablet by mouth at bedtime.   nitroGLYCERIN  0.4 MG SL tablet Commonly known as: NITROSTAT  Place 0.4 mg under the tongue every 5 (five) minutes as needed for chest pain.   NovoLIN 70/30 (70-30) 100 UNIT/ML injection Generic drug: insulin  NPH-regular Human Inject 30-40 Units into the skin.   pantoprazole  40 MG tablet Commonly known as: PROTONIX  Take 40 mg by mouth at bedtime.   potassium chloride  10 MEQ CR capsule Commonly known as: MICRO-K  Take 20 mEq by mouth at bedtime.   predniSONE  10 MG tablet Commonly known as: DELTASONE  Take 10 mg by mouth at bedtime. Continuous course.   rosuvastatin  5 MG tablet Commonly known as: CRESTOR  Take 5 mg by mouth at bedtime.   sacubitril -valsartan  24-26 MG Commonly known as: ENTRESTO  Take 1 tablet by mouth 2 (two) times daily.   spironolactone  25 MG tablet Commonly known as: ALDACTONE  Take 0.5 tablets (12.5 mg total) by mouth daily.   zinc  sulfate (50mg  elemental zinc ) 220 (50 Zn) MG capsule Take 1 capsule (220 mg total) by mouth at bedtime.               Discharge Care Instructions  (From admission, onward)           Start     Ordered   11/17/23 0000  Discharge wound care:       Comments: Wound care  Every day Comments: Cleanse perineal and scrotal area with soap and water  and pat dry.  Apply Gerhardts paste to scrotum twice daily and PRN soilage. May cover scrotum with vaseline gauze and kerlix if needed.   11/17/23 1359            Follow-up Information     [facility]. Go on 11/24/2023.   Specialty: Cardiology Why: Hospital Follow-Up 11/24/23 @ 10:30 Please bring all medciations to  follow-up appointment [facility], [address] Second Floor Free Valet Parking at the door Contact information: [address] [place]  [address] [phone]               Discharge Exam:  GEN: NAD SKIN: Warm and dry EYES: No pallor or icterus ENT: MMM CV: RRR PULM: CTA B ABD: soft, ND, NT, +BS CNS: AAO x 3, non focal EXT: Left foot wound.  Mild left foot swelling   Condition at discharge: good  The results of significant diagnostics from this hospitalization (including imaging, microbiology, ancillary and laboratory) are listed below for reference.   Imaging Studies: CARDIAC CATHETERIZATION Result Date: 11/17/2023   Prox LAD-2 lesion is 65% stenosed.   Mid Cx lesion is 50% stenosed.   Ost 1st Diag lesion is 100% stenosed.   Ost Ramus to Ramus lesion is 60% stenosed.   Prox LAD-1 lesion is 65% stenosed.   Non-stenotic Mid RCA lesion was previously treated.   Non-stenotic Prox RCA lesion was previously treated.   Non-stenotic Ost RPDA lesion was previously treated.   Non-stenotic Ost RPDA to RPDA lesion was previously treated.   Non-stenotic RPAV lesion was previously treated. 1.  Widely patent RCA stents with no significant restenosis.  Patent LAD stent with borderline significant in-stent restenosis.  Occluded diagonal stent is new compared to most recent catheterization.  Stable moderate left circumflex and ramus disease. 2.  Left ventricular angiography was not performed.  EF was moderately reduced by echo.  Normal left ventricular end-diastolic pressure. Recommendations: Non-STEMI could  have been related to diagonal stent occlusion versus supply/demand ischemia in the setting of underlying coronary artery disease.  The diagonal gets collaterals from the RCA. Recommend medical therapy for now. If the patient has refractory angina, FFR guided PCI to the LAD can be considered.   DG Abd 1 View Result Date: 11/16/2023 CLINICAL DATA:  Abdomen pain constipation EXAM: ABDOMEN - 1 VIEW COMPARISON:  CT 03/15/2022 FINDINGS: Surgical clips in the right upper quadrant. Nonobstructed gas pattern with moderate stool in the colon. Hardware in the lower lumbar spine. Vascular calcifications. IMPRESSION: Nonobstructed gas pattern with moderate stool in the colon. Electronically Signed   By: Luke Bun M.D.   On: 11/16/2023 17:05   CT HEAD WO CONTRAST ( ) Result Date: 11/13/2023 EXAM: CT HEAD WITHOUT CONTRAST 11/13/2023 06:45:29 PM TECHNIQUE: CT of the head was performed without the administration of intravenous contrast. Automated exposure control, iterative reconstruction, and/or weight based adjustment of the mA/kV was utilized to reduce the radiation dose to as low as reasonably achievable. COMPARISON: CT head 11/11/23 CLINICAL HISTORY: Fall, head injury on Heparin  gtt. FINDINGS: BRAIN AND VENTRICLES: No acute hemorrhage. No evidence of acute infarct. No hydrocephalus. No extra-axial collection. No mass effect or midline shift. Nonspecific hypoattenuation in the periventricular and subcortical white matter, most likely representing chronic small vessel disease. ORBITS: Bilateral lens replacement. SINUSES: Small bilateral mastoid effusions similar to prior. Mucosal thickening in the right sphenoid sinus slightly increased from prior. SOFT TISSUES AND SKULL: No acute soft tissue abnormality. No skull fracture. VASCULATURE: Atherosclerosis involving the carotid siphons and intracranial vertebral arteries. IMPRESSION: 1. No acute intracranial abnormality related to the fall and head injury. 2. Mild chronic  small vessel disease. 3. Mucosal thickening in the right sphenoid sinus, slightly increased from prior. Electronically signed by: Donnice Mania MD 11/13/2023 09:11 PM EDT RP Workstation: [id number]   ECHOCARDIOGRAM COMPLETE Result Date: 11/12/2023    ECHOCARDIOGRAM REPORT  Patient Name:   [name] Date of Exam: 11/12/2023 Medical Rec #:  [id number]  Height:       70.0 in Accession #:    [id number] Weight:       200.0 lb Date of Birth:  12-15-1945  BSA:          2.087 m Patient Age:    77 years   BP:           134/80 mmHg Patient Gender: M          HR:           91 bpm. Exam Location:  [facility] Procedure: 2D Echo, Cardiac Doppler, Color Doppler and Intracardiac            Opacification Agent (Both Spectral and Color Flow Doppler were            utilized during procedure). Indications:     CHF  History:         Patient has prior history of Echocardiogram examinations, most                  recent 07/13/2022. CAD; Risk Factors:Dyslipidemia, Hypertension,                  Sleep Apnea and Diabetes. CKD STAGE 3.  Sonographer:     Philomena Daring Referring Phys:  [id number] BRENDA MORRISON Diagnosing Phys: Annabella Scarce MD IMPRESSIONS  1. Left ventricular ejection fraction, by estimation, is 35 to 40%. The left ventricle has moderately decreased function. The left ventricle demonstrates regional wall motion abnormalities (see scoring diagram/findings for description). Left ventricular  diastolic parameters are consistent with Grade I diastolic dysfunction (impaired relaxation). Elevated left ventricular end-diastolic pressure.  2. Right ventricular systolic function is normal. The right ventricular size is normal. There is normal pulmonary artery systolic pressure.  3. The mitral valve is normal in structure. No evidence of mitral valve regurgitation. No evidence of mitral stenosis.  4. The aortic valve is tricuspid. There is mild calcification of the aortic valve. There is mild thickening of the aortic valve. Aortic valve  regurgitation is not visualized. No aortic stenosis is present.  5. The inferior vena cava is normal in size with greater than 50% respiratory variability, suggesting right atrial pressure of 3 mmHg. Comparison(s): A prior study was performed on 07/2022. Changes from prior study are noted. Compared with echo 07/2022, systolic dysfunction is new. FINDINGS  Left Ventricle: Left ventricular ejection fraction, by estimation, is 35 to 40%. The left ventricle has moderately decreased function. The left ventricle demonstrates regional wall motion abnormalities. The left ventricular internal cavity size was normal in size. There is no left ventricular hypertrophy. Left ventricular diastolic parameters are consistent with Grade I diastolic dysfunction (impaired relaxation). Elevated left ventricular end-diastolic pressure.  LV Wall Scoring: The entire anterior wall, mid and distal anterior septum, inferior septum, posterior wall, and apex are hypokinetic. The antero-lateral wall, entire inferior wall, basal anteroseptal segment, and apical lateral segment are normal. Right Ventricle: The right ventricular size is normal. No increase in right ventricular wall thickness. Right ventricular systolic function is normal. There is normal pulmonary artery systolic pressure. The tricuspid regurgitant velocity is 1.24 m/s, and  with an assumed right atrial pressure of 3 mmHg, the estimated right ventricular systolic pressure is 9.2 mmHg. Left Atrium: Left atrial size was normal in size. Right Atrium: Right atrial size was normal in size. Pericardium: There is no evidence of pericardial effusion. Mitral Valve: The mitral  valve is normal in structure. No evidence of mitral valve regurgitation. No evidence of mitral valve stenosis. Tricuspid Valve: The tricuspid valve is normal in structure. Tricuspid valve regurgitation is trivial. No evidence of tricuspid stenosis. Aortic Valve: The aortic valve is tricuspid. There is mild calcification  of the aortic valve. There is mild thickening of the aortic valve. Aortic valve regurgitation is not visualized. No aortic stenosis is present. Pulmonic Valve: The pulmonic valve was normal in structure. Pulmonic valve regurgitation is mild. No evidence of pulmonic stenosis. Aorta: The aortic root is normal in size and structure. Venous: The inferior vena cava is normal in size with greater than 50% respiratory variability, suggesting right atrial pressure of 3 mmHg. IAS/Shunts: No atrial level shunt detected by color flow Doppler.  LEFT VENTRICLE PLAX 2D LVIDd:         5.60 cm   Diastology LVIDs:         4.50 cm   LV e' medial:    0.04 cm/s LV PW:         1.20 cm   LV E/e' medial:  15.8 LV IVS:        1.00 cm   LV e' lateral:   0.07 cm/s LVOT diam:     1.80 cm   LV E/e' lateral: 9.7 LV SV:         44 LV SV Index:   21 LVOT Area:     2.54 cm  RIGHT VENTRICLE             IVC RV S prime:     13.90 cm/s  IVC diam: 1.50 cm TAPSE (M-mode): 2.1 cm LEFT ATRIUM             Index        RIGHT ATRIUM           Index LA diam:        3.70 cm 1.77 cm/m   RA Area:     12.80 cm LA Vol (A2C):   45.0 ml 21.56 ml/m  RA Volume:   29.40 ml  14.09 ml/m LA Vol (A4C):   59.7 ml 28.60 ml/m LA Biplane Vol: 56.0 ml 26.83 ml/m  AORTIC VALVE LVOT Vmax:   104.00 cm/s LVOT Vmean:  68.200 cm/s LVOT VTI:    0.171 m  AORTA Ao Root diam: 2.70 cm MITRAL VALVE               TRICUSPID VALVE MV Area (PHT): 5.62 cm    TR Peak grad:   6.2 mmHg MV Decel Time: 135 msec    TR Vmax:        124.00 cm/s MV E velocity: 0.69 cm/s MV A velocity: 97.20 cm/s  SHUNTS MV E/A ratio:  0.01        Systemic VTI:  0.17 m                            Systemic Diam: 1.80 cm Annabella Scarce MD Electronically signed by Annabella Scarce MD Signature Date/Time: 11/12/2023/10:26:41 AM    Final    DG Chest Port 1 View Result Date: 11/12/2023 CLINICAL DATA:  [id number].  Follow-up exam. EXAM: PORTABLE CHEST 1 VIEW COMPARISON:  Portable chest yesterday at 7:06 a.m. FINDINGS: 6:03  a.m. There is patchy airspace disease in both lung bases. Overall aeration is unchanged with remaining lungs clear. Minimal pleural effusions. Stable cardiomegaly. No vascular congestion is seen. Stable mediastinum and osseous  structures. IMPRESSION: 1. No significant change in patchy airspace disease in both lung bases. 2. Stable cardiomegaly. 3. Minimal pleural effusions. Electronically Signed   By: Francis Quam M.D.   On: 11/12/2023 07:56   CT Angio Chest Pulmonary Embolism (PE) W or WO Contrast Result Date: 11/12/2023 EXAM: CTA of the Chest with contrast for PE 11/12/2023 05:42:36 AM TECHNIQUE: CTA of the chest was performed after the administration of intravenous contrast. Multiplanar reformatted images are provided for review. MIP images are provided for review. Automated exposure control, iterative reconstruction, and/or weight based adjustment of the mA/kV was utilized to reduce the radiation dose to as low as reasonably achievable. COMPARISON: CT of the chest dated 08/02/2022. CLINICAL HISTORY: Pulmonary embolism (PE) suspected, low to intermediate prob, positive D-dimer. FINDINGS: PULMONARY ARTERIES: Pulmonary arteries are adequately opacified for evaluation. No pulmonary embolism. Main pulmonary artery is normal in caliber. MEDIASTINUM: The heart and pericardium demonstrate no acute abnormality. There is moderate calcific coronary artery disease. There is mild-to-moderate calcific atheromatous disease within the aortic arch. LYMPH NODES: No mediastinal, hilar or axillary lymphadenopathy. LUNGS AND PLEURA: There are dependent ground-glass opacities within the lungs bilaterally. There are streaky peribronchovascular opacities within the lower lobes bilaterally. There is a mild right pleural effusion and a tiny left effusion. UPPER ABDOMEN: The patient is status post cholecystectomy. SOFT TISSUES AND BONES: No acute bone or soft tissue abnormality. IMPRESSION: 1. No evidence of pulmonary embolism. 2.  Mild right pleural effusion and tiny left effusion. 3. Peribronchial opacities, compatible with bronchitis/bronchiolitis. Electronically signed by: Evalene Coho MD 11/12/2023 05:52 AM EDT RP Workstation: [id number]   CT HEAD WO CONTRAST ( ) Result Date: 11/11/2023 CLINICAL DATA:  Encephalopathy. EXAM: CT HEAD WITHOUT CONTRAST TECHNIQUE: Contiguous axial images were obtained from the base of the skull through the vertex without intravenous contrast. RADIATION DOSE REDUCTION: This exam was performed according to the departmental dose-optimization program which includes automated exposure control, adjustment of the mA and/or kV according to patient size and/or use of iterative reconstruction technique. COMPARISON:  None Available. FINDINGS: Brain: There is no evidence of an acute infarct, intracranial hemorrhage, mass, midline shift, or extra-axial fluid collection. There is mild cerebral atrophy. Cerebral white matter hypodensities are nonspecific but compatible with mild chronic small vessel ischemic disease. Vascular: Calcified atherosclerosis at the skull base. No hyperdense vessel. Skull: No acute fracture or suspicious lesion. Sinuses/Orbits: Small volume secretions in the right sphenoid sinus. Small bilateral mastoid effusions. Bilateral cataract extraction. Other: None. IMPRESSION: 1. No evidence of acute intracranial abnormality. 2. Mild chronic small vessel ischemic disease. Electronically Signed   By: Dasie Hamburg M.D.   On: 11/11/2023 19:05   DG Chest 1 View Result Date: 11/11/2023 CLINICAL DATA:  Congestive heart failure. EXAM: CHEST  1 VIEW COMPARISON:  November 10, 2023. FINDINGS: Stable cardiomegaly. Mild right basilar subsegmental atelectasis or infiltrate is noted. Left lung is clear. Bony thorax is unremarkable. IMPRESSION: Mild right basilar subsegmental atelectasis or infiltrate. Electronically Signed   By: Lynwood Landy Raddle M.D.   On: 11/11/2023 09:10   DG Chest 1 View Result Date:  11/10/2023 CLINICAL DATA:  Pneumonia, vomiting, shortness of breath EXAM: CHEST  1 VIEW COMPARISON:  11/09/2023 FINDINGS: Cardiomegaly. Bilateral lower lobe airspace opacities, right greater than left concerning for pneumonia. No visible effusions. No acute bony abnormality. IMPRESSION: Bilateral lower lobe airspace opacities, right greater than left concerning for pneumonia. This is progressed since prior study. Electronically Signed   By: Franky Crease M.D.   On: 11/10/2023  18:12   DG Chest Portable 1 View Result Date: 11/09/2023 CLINICAL DATA:  Overdose. EXAM: PORTABLE CHEST 1 VIEW COMPARISON:  10/09/2023 FINDINGS: Stable cardiomegaly. Subsegmental atelectasis or scarring. No pulmonary edema, large pleural effusion or pneumothorax. No acute osseous findings. IMPRESSION: Stable cardiomegaly. Subsegmental atelectasis or scarring. Electronically Signed   By: Andrea Gasman M.D.   On: 11/09/2023 22:54    Microbiology: Results for orders placed or performed during the hospital encounter of 11/09/23  Resp panel by RT-PCR (RSV, Flu A&B, Covid) Anterior Nasal Swab     Status: None   Collection Time: 11/10/23 10:55 AM   Specimen: Anterior Nasal Swab  Result Value Ref Range Status   SARS Coronavirus 2 by RT PCR NEGATIVE NEGATIVE Final   Influenza A by PCR NEGATIVE NEGATIVE Final   Influenza B by PCR NEGATIVE NEGATIVE Final    Comment: (NOTE) The Xpert Xpress SARS-CoV-2/FLU/RSV plus assay is intended as an aid in the diagnosis of influenza from Nasopharyngeal swab specimens and should not be used as a sole basis for treatment. Nasal washings and aspirates are unacceptable for Xpert Xpress SARS-CoV-2/FLU/RSV testing.  Fact Sheet for Patients: BloggerCourse.com  Fact Sheet for Healthcare Providers: SeriousBroker.it  This test is not yet approved or cleared by the [place]  FDA and has been authorized for detection and/or diagnosis of SARS-CoV-2  by FDA under an Emergency Use Authorization (EUA). This EUA will remain in effect (meaning this test can be used) for the duration of the COVID-19 declaration under Section 564(b)(1) of the Act, 21 U.S.C. section 360bbb-3(b)(1), unless the authorization is terminated or revoked.     Resp Syncytial Virus by PCR NEGATIVE NEGATIVE Final    Comment: (NOTE) Fact Sheet for Patients: BloggerCourse.com  Fact Sheet for Healthcare Providers: SeriousBroker.it  This test is not yet approved or cleared by the [place]  FDA and has been authorized for detection and/or diagnosis of SARS-CoV-2 by FDA under an Emergency Use Authorization (EUA). This EUA will remain in effect (meaning this test can be used) for the duration of the COVID-19 declaration under Section 564(b)(1) of the Act, 21 U.S.C. section 360bbb-3(b)(1), unless the authorization is terminated or revoked.  Performed at [facility], [address], [place], [place] [address]     Labs: CBC: Recent Labs  Lab 11/12/23 0434 11/13/23 0321 11/14/23 0319 11/15/23 0326  WBC 14.3* 19.3* 12.5* 10.8*  HGB 12.9* 12.2* 11.9* 13.1  HCT 41.0 37.1* 35.0* 39.5  MCV 102.5* 99.2 96.7 97.3  PLT 269 249 245 245   Basic Metabolic Panel: Recent Labs  Lab 11/11/23 2016 11/12/23 0434 11/12/23 1939 11/13/23 0321 11/14/23 0319 11/15/23 0326 11/15/23 1207  NA 139 139  --  131* 136 141  --   K 3.7 3.8  --  3.5 3.2* 4.1  --   CL 97* 96*  --  91* 96* 103  --   CO2 33* 32  --  31 30 29   --   GLUCOSE 261* 237*  --  194* 147* 204* 412*  BUN 18 16  --  26* 31* 22  --   CREATININE 1.38* 1.33*  --  1.52* 1.41* 1.08  --   CALCIUM  8.4* 8.4*  --  8.1* 7.7* 8.0*  --   MG 1.7 2.0  --  2.0 1.9  --   --   PHOS  --  1.2* 2.5 2.3* 2.8  --   --    Liver Function Tests: Recent Labs  Lab 11/11/23 2016 11/12/23  0434  AST 29 28  ALT 18 20  ALKPHOS 66 69  BILITOT 1.2 1.4*  PROT 6.8 7.0   ALBUMIN 3.0* 3.0*   CBG: Recent Labs  Lab 11/17/23 1023 11/17/23 1338 11/17/23 1650 11/17/23 1946 11/18/23 0809  GLUCAP 270* 218* 390* 293* 295*    Discharge time spent: greater than 30 minutes.  Signed: AIDA CHO, MD [facility] 11/18/2023

## 2023-11-18 NOTE — Plan of Care (Signed)

## 2023-11-22 DIAGNOSIS — E11610 Type 2 diabetes mellitus with diabetic neuropathic arthropathy: Secondary | ICD-10-CM | POA: Diagnosis not present

## 2023-11-22 DIAGNOSIS — E1165 Type 2 diabetes mellitus with hyperglycemia: Secondary | ICD-10-CM | POA: Diagnosis not present

## 2023-11-22 DIAGNOSIS — L03116 Cellulitis of left lower limb: Secondary | ICD-10-CM | POA: Diagnosis not present

## 2023-11-22 DIAGNOSIS — E1122 Type 2 diabetes mellitus with diabetic chronic kidney disease: Secondary | ICD-10-CM | POA: Diagnosis not present

## 2023-11-22 DIAGNOSIS — N1831 Chronic kidney disease, stage 3a: Secondary | ICD-10-CM | POA: Diagnosis not present

## 2023-11-22 DIAGNOSIS — L02612 Cutaneous abscess of left foot: Secondary | ICD-10-CM | POA: Diagnosis not present

## 2023-11-22 DIAGNOSIS — E1151 Type 2 diabetes mellitus with diabetic peripheral angiopathy without gangrene: Secondary | ICD-10-CM | POA: Diagnosis not present

## 2023-11-22 DIAGNOSIS — I509 Heart failure, unspecified: Secondary | ICD-10-CM | POA: Diagnosis not present

## 2023-11-22 DIAGNOSIS — I130 Hypertensive heart and chronic kidney disease with heart failure and stage 1 through stage 4 chronic kidney disease, or unspecified chronic kidney disease: Secondary | ICD-10-CM | POA: Diagnosis not present

## 2023-11-23 ENCOUNTER — Telehealth: Payer: Self-pay | Admitting: Family

## 2023-11-23 DIAGNOSIS — I739 Peripheral vascular disease, unspecified: Secondary | ICD-10-CM | POA: Diagnosis not present

## 2023-11-23 NOTE — Telephone Encounter (Signed)
 Called to confirm/remind patient of their appointment at [facility] on 11/23/23.   Appointment:   [] Confirmed  [x] Left mess   [] No answer/No voice mail  [] VM Full/unable to leave message  [] Phone not in service  Patient reminded to bring all medications and/or complete list.  Confirmed patient has transportation. Gave directions, instructed to utilize valet parking.

## 2023-11-24 ENCOUNTER — Encounter: Admitting: Family

## 2023-11-24 DIAGNOSIS — I5042 Chronic combined systolic (congestive) and diastolic (congestive) heart failure: Secondary | ICD-10-CM | POA: Diagnosis not present

## 2023-11-24 DIAGNOSIS — E11610 Type 2 diabetes mellitus with diabetic neuropathic arthropathy: Secondary | ICD-10-CM | POA: Diagnosis not present

## 2023-11-24 DIAGNOSIS — I130 Hypertensive heart and chronic kidney disease with heart failure and stage 1 through stage 4 chronic kidney disease, or unspecified chronic kidney disease: Secondary | ICD-10-CM | POA: Diagnosis not present

## 2023-11-24 DIAGNOSIS — L03116 Cellulitis of left lower limb: Secondary | ICD-10-CM | POA: Diagnosis not present

## 2023-11-24 DIAGNOSIS — E1122 Type 2 diabetes mellitus with diabetic chronic kidney disease: Secondary | ICD-10-CM | POA: Diagnosis not present

## 2023-11-24 DIAGNOSIS — E1140 Type 2 diabetes mellitus with diabetic neuropathy, unspecified: Secondary | ICD-10-CM | POA: Diagnosis not present

## 2023-11-24 DIAGNOSIS — E1151 Type 2 diabetes mellitus with diabetic peripheral angiopathy without gangrene: Secondary | ICD-10-CM | POA: Diagnosis not present

## 2023-11-24 DIAGNOSIS — L02612 Cutaneous abscess of left foot: Secondary | ICD-10-CM | POA: Diagnosis not present

## 2023-11-28 ENCOUNTER — Ambulatory Visit: Admitting: Cardiology

## 2023-11-28 DIAGNOSIS — I2510 Atherosclerotic heart disease of native coronary artery without angina pectoris: Secondary | ICD-10-CM | POA: Diagnosis not present

## 2023-11-28 DIAGNOSIS — E039 Hypothyroidism, unspecified: Secondary | ICD-10-CM | POA: Diagnosis not present

## 2023-11-28 DIAGNOSIS — E11621 Type 2 diabetes mellitus with foot ulcer: Secondary | ICD-10-CM | POA: Diagnosis not present

## 2023-11-28 DIAGNOSIS — I5022 Chronic systolic (congestive) heart failure: Secondary | ICD-10-CM | POA: Diagnosis not present

## 2023-11-28 DIAGNOSIS — K219 Gastro-esophageal reflux disease without esophagitis: Secondary | ICD-10-CM | POA: Diagnosis not present

## 2023-11-28 DIAGNOSIS — D860 Sarcoidosis of lung: Secondary | ICD-10-CM | POA: Diagnosis not present

## 2023-11-28 DIAGNOSIS — E782 Mixed hyperlipidemia: Secondary | ICD-10-CM | POA: Diagnosis not present

## 2023-11-28 DIAGNOSIS — I10 Essential (primary) hypertension: Secondary | ICD-10-CM | POA: Diagnosis not present

## 2023-12-04 ENCOUNTER — Encounter: Payer: Self-pay | Admitting: Cardiovascular Disease

## 2023-12-04 ENCOUNTER — Encounter: Payer: Self-pay | Admitting: Cardiology

## 2023-12-04 ENCOUNTER — Telehealth: Payer: Self-pay | Admitting: Cardiovascular Disease

## 2023-12-04 NOTE — Telephone Encounter (Signed)
 Patient contacted 3x to schedule with no success. Will send letter

## 2023-12-04 NOTE — Telephone Encounter (Signed)
-----   Message from Deatrice Cage sent at 11/17/2023 12:34 PM EDT ----- [facility] follow-up requested with Dr. Jeffrie or APP within 2 weeks.

## 2023-12-05 DIAGNOSIS — I5032 Chronic diastolic (congestive) heart failure: Secondary | ICD-10-CM | POA: Diagnosis not present

## 2023-12-05 DIAGNOSIS — I739 Peripheral vascular disease, unspecified: Secondary | ICD-10-CM | POA: Diagnosis not present

## 2023-12-05 DIAGNOSIS — N1831 Chronic kidney disease, stage 3a: Secondary | ICD-10-CM | POA: Diagnosis not present

## 2023-12-05 DIAGNOSIS — I10 Essential (primary) hypertension: Secondary | ICD-10-CM | POA: Diagnosis not present

## 2023-12-05 DIAGNOSIS — G4733 Obstructive sleep apnea (adult) (pediatric): Secondary | ICD-10-CM | POA: Diagnosis not present

## 2023-12-05 DIAGNOSIS — I25118 Atherosclerotic heart disease of native coronary artery with other forms of angina pectoris: Secondary | ICD-10-CM | POA: Diagnosis not present

## 2023-12-07 DIAGNOSIS — L02612 Cutaneous abscess of left foot: Secondary | ICD-10-CM | POA: Diagnosis not present

## 2023-12-07 DIAGNOSIS — L03116 Cellulitis of left lower limb: Secondary | ICD-10-CM | POA: Diagnosis not present

## 2023-12-07 DIAGNOSIS — E1122 Type 2 diabetes mellitus with diabetic chronic kidney disease: Secondary | ICD-10-CM | POA: Diagnosis not present

## 2023-12-07 DIAGNOSIS — I130 Hypertensive heart and chronic kidney disease with heart failure and stage 1 through stage 4 chronic kidney disease, or unspecified chronic kidney disease: Secondary | ICD-10-CM | POA: Diagnosis not present

## 2023-12-07 DIAGNOSIS — E11610 Type 2 diabetes mellitus with diabetic neuropathic arthropathy: Secondary | ICD-10-CM | POA: Diagnosis not present

## 2023-12-07 DIAGNOSIS — E1140 Type 2 diabetes mellitus with diabetic neuropathy, unspecified: Secondary | ICD-10-CM | POA: Diagnosis not present

## 2023-12-07 DIAGNOSIS — I5042 Chronic combined systolic (congestive) and diastolic (congestive) heart failure: Secondary | ICD-10-CM | POA: Diagnosis not present

## 2023-12-07 DIAGNOSIS — J690 Pneumonitis due to inhalation of food and vomit: Secondary | ICD-10-CM | POA: Diagnosis not present

## 2023-12-07 DIAGNOSIS — E1151 Type 2 diabetes mellitus with diabetic peripheral angiopathy without gangrene: Secondary | ICD-10-CM | POA: Diagnosis not present

## 2023-12-08 ENCOUNTER — Other Ambulatory Visit: Payer: Self-pay | Admitting: Cardiology

## 2023-12-13 DIAGNOSIS — I493 Ventricular premature depolarization: Secondary | ICD-10-CM | POA: Diagnosis not present

## 2023-12-13 DIAGNOSIS — I5022 Chronic systolic (congestive) heart failure: Secondary | ICD-10-CM | POA: Diagnosis not present

## 2023-12-13 DIAGNOSIS — D869 Sarcoidosis, unspecified: Secondary | ICD-10-CM | POA: Diagnosis not present

## 2023-12-13 DIAGNOSIS — I25118 Atherosclerotic heart disease of native coronary artery with other forms of angina pectoris: Secondary | ICD-10-CM | POA: Diagnosis not present

## 2023-12-13 DIAGNOSIS — R001 Bradycardia, unspecified: Secondary | ICD-10-CM | POA: Diagnosis not present

## 2023-12-13 DIAGNOSIS — I10 Essential (primary) hypertension: Secondary | ICD-10-CM | POA: Diagnosis not present

## 2023-12-13 DIAGNOSIS — I739 Peripheral vascular disease, unspecified: Secondary | ICD-10-CM | POA: Diagnosis not present

## 2023-12-13 DIAGNOSIS — R600 Localized edema: Secondary | ICD-10-CM | POA: Diagnosis not present

## 2023-12-13 DIAGNOSIS — N1831 Chronic kidney disease, stage 3a: Secondary | ICD-10-CM | POA: Diagnosis not present

## 2023-12-13 NOTE — Progress Notes (Signed)
 Subjective   Patient ID:  Craig Steinberger Sr. is a 78 y.o. (DOB October 23, 1945) male CC: CAD, PAD, DCHF/LE edema, bradycardia, HTN, CKD, PVC's, sarcoidosis    HPI: [name] is a 78 year old male who saw me in September.  He was recently admitted to the hospital in [place] and he ruled in for an MI.  He underwent an angiogram and was found to have mild to moderate diffuse disease.  He did not require intervention.  He has done well since then from an anginal point of view but he has had tremendous lower extremity edema.  He says when he was admitted both legs were tremendously swollen but especially the left leg.  Since discharge leg swelling has been an issue.  His left leg has been tremendously swollen and his right leg moderately swollen.  He was recently taken off of amlodipine  and Entresto  and placed back on losartan  and he thinks that has helped some.  He has been taking Lasix  40 mg daily but it really has not making him urinate much.  He has bumped up the Lasix  to 80 mg every few days and that has seemed to make an effect.  He still has a lot of edema but he says he has definitely improved over the past week or so.  PAD: No evidence of cellulitis.  No rest pain.  No claudication but he is really not ambulating. Bradycardia: He was started on low-dose beta-blocker and surprisingly he has tolerated it well with no adverse effects.  He says his watch says his heart rate has been between Hypertension: Blood pressure 134/76 and has not had hypotensive symptoms. CKD: No symptoms.  I can tell his renal function has been adequate recently. PVCs: No specific symptoms.  No syncope.  Some of the records suggest he did have pneumonia when he was hospitalized.  I see quite a volume of lab.  August 16 potassium 4.2 and creatinine 1.3.  Hemoglobin 12.8 with MCV 99.  September 10 potassium 4.1 and creatinine 1.08.  LDL 108, HDL 13 and MCV normal.  Echo showed EF in upper 30s with normal CVP and no valve disease.   BNP was 838 with normal less than 100.  Troponin 1170, 992 and 753   Reviewed and updated this visit by provider: None        Review of Systems  Constitutional:  Positive for fatigue. Negative for activity change and unexpected weight change.  Respiratory:  Negative for cough and shortness of breath.   Cardiovascular:  Positive for leg swelling. Negative for chest pain and palpitations.  Gastrointestinal:  Negative for abdominal pain, diarrhea, nausea and vomiting.  Neurological:  Negative for dizziness, syncope and light-headedness.     Objective   Vitals:   12/13/23 1125  BP: 134/76  Patient Position: Sitting  Pulse: 56  Height: 5' 10 (1.778 m)  Weight: Comment: unable to weigh  SpO2: 97%     Physical Exam Vitals reviewed.  Constitutional:      General: He is not in acute distress.    Appearance: Normal appearance. He is normal weight. He is not ill-appearing.  HENT:     Head: Normocephalic and atraumatic.     Nose: Nose normal.  Eyes:     General: No scleral icterus.    Conjunctiva/sclera: Conjunctivae normal.  Cardiovascular:     Rate and Rhythm: Normal rate and regular rhythm. No extrasystoles are present.    Pulses:  Radial pulses are 2+ on the right side and 2+ on the left side.     Heart sounds: Normal heart sounds, S1 normal and S2 normal. No murmur heard.    No friction rub.     Comments: Feet are warm and pink Musculoskeletal:     Right lower leg: 2+ Pitting Edema present.     Left lower leg: 3+ Pitting Edema present.  Pulmonary:     Effort: Pulmonary effort is normal. No respiratory distress.     Breath sounds: Normal breath sounds. No decreased breath sounds, wheezing, rhonchi or rales.  Abdominal:     General: Abdomen is flat. There is no distension.     Palpations: Abdomen is soft. There is no mass.     Tenderness: There is no abdominal tenderness. There is no guarding.  Skin:    General: Skin is warm and dry.     Coloration: Skin is  not jaundiced or pale.     Findings: No bruising, erythema or rash.  Neurological:     Mental Status: He is alert and oriented to person, place, and time. Mental status is at baseline.  Psychiatric:        Mood and Affect: Mood normal.        Behavior: Behavior normal.        Thought Content: Thought content normal.        Judgment: Judgment normal.       Assessment and Plan  1. Coronary artery disease involving native coronary artery of native heart with other form of angina pectoris (Primary) -     CBC; Future -     Comprehensive Metabolic Panel; Future 2. Primary hypertension 3. Stage 3a chronic kidney disease (*) 4. PAD (peripheral artery disease) 5. Bradycardia 6. PVC's (premature ventricular contractions) 7. Sarcoidosis 8. Chronic systolic CHF (congestive heart failure) (*) -     CBC; Future -     Comprehensive Metabolic Panel; Future 9. Bilateral leg edema -     CBC; Future -     Comprehensive Metabolic Panel; Future    Impression: 1.  CAD.  He had a recent small NSTEMI possibly a type II MI.  Angiogram in [place] showed moderate LAD disease (in-stent restenosis reportedly), an occluded diagonal, moderate ramus intermedius disease, moderate circumflex disease and mild right coronary disease.  He is doing well now with no angina.  He did not require any intervention when he had the angiogram in [place].  Reportedly he did have pneumonia at that time. 2.  Severe left greater than right leg swelling.  I suspect amlodipine  played a big role in the swelling.  He is better but he has required fairly large doses of Lasix  to get some improvement.  I cannot rule out some component of heart failure but he has no other signs or symptoms of volume overload. 3.  Chronic systolic CHF.  Echo at the [facility] 1 month ago showed EF 35 to 40%.  At the time of discharge his ARB was stopped and he was placed on Entresto .  Entresto  was stopped last week due to swelling and the fact  that he could not urinate.  I doubt the Entresto  was the culprit but I think the best option at this point is to stay off Entresto  for now. 4.  PAD-he had complex intervention on his left leg within the past few months.  He seems to be doing well. 5.  Sarcoidosis chronic and on low-dose steroids-stable best I can  tell. 6.  History of bradycardia.  My previous note said that he was intolerant of beta-blocker but he is tolerating low-dose Toprol  at bedtime now and I think it is reasonable to continue that. 7.  History of CKD-his last creatinine looked great-1.08 September 10.  Potassium was 4.1 that day.  H&H were normal that day. 8.  Hypertension fairly well-controlled with current regimen. 9.  PVCs-stable and asymptomatic  Plan: Continue to alternate Lasix  40 mg 1 day then 80 the next day.  If that regimen does not improve the leg swelling then he is to call us  and I would switch him to Demadex.  We could even consider metolazone  if needed.  Continue low-dose spironolactone  for now pending further lab work.  Continue low-dose statin (he has had problems with statins before but seems to tolerate low-dose Crestor  well now).  He is due to have an echo repeated in the next week or 2 and to see me at the end of this month.  Depending upon how he does, we could consider an SGLT inhibitor cautiously. Follow up in about 3 weeks (around 01/03/2024).    Total time spent with patient and chart: 68 minutes  Patient's Medications  New Prescriptions   No medications on file  Previous Medications   ACETAMINOPHEN  (TYLENOL ) 500 MG TABLET    Take two tablets (1,000 mg dose) by mouth every 4 (four) hours as needed for Pain.   ALBUTEROL  SULFATE HFA (PROVENTIL ,VENTOLIN ,PROAIR ) 108 (90 BASE) MCG/ACT INHALER    Inhale two puffs into the lungs every 6 (six) hours as needed for Wheezing.   ASPIRIN  (ECOTRIN LOW DOSE) EC TABLET    Take one tablet (81 mg dose) by mouth daily.   CLOPIDOGREL  BISULFATE (PLAVIX ) 75 MG TABLET     Take one tablet (75 mg dose) by mouth daily.   COENZYME Q10 100 MG CAPSULE    Take four capsules (400 mg dose) by mouth daily.   CYCLOBENZAPRINE (FLEXERIL) 5 MG TABLET    Take one tablet (5 mg dose) by mouth 3 (three) times a day as needed for Muscle spasms.   EZETIMIBE  (ZETIA ) 10 MG TABLET    Take one tablet (10 mg dose) by mouth daily.   FISH OIL (SEA-OMEGA) 1000 MG CAPS    Take one capsule (1,000 mg dose) by mouth daily.   FUROSEMIDE  (LASIX ) 80 MG TABLET    Take one tablet (80 mg dose) by mouth daily as needed.   GABAPENTIN  (NEURONTIN ) 100 MG CAPSULE    Take one capsule (100 mg dose) by mouth at bedtime.   INSULIN  NPH-INSULIN  REGULAR (NOVOLIN 70/30) (70-30) 100 UNIT/ML INJECTION    Inject 30-40 units three times a day with meals   LEVOTHYROXINE  SODIUM (SYNTHROID ,LEVOTHROID,LEVOXYL ) 75 MCG TABLET    Take one tablet (75 mcg dose) by mouth daily at 6 (six) am.   LOSARTAN  POTASSIUM (COZAAR ) 50 MG TABLET    Take one tablet (50 mg dose) by mouth 2 (two) times daily.   METOPROLOL  SUCCINATE (TOPROL -XL) 25 MG 24 HR TABLET    Take one tablet (25 mg dose) by mouth at bedtime.   MONTELUKAST  (SINGULAIR ) 10 MG TABLET    Take one tablet (10 mg dose) by mouth at bedtime as needed (ALLERGIES).   MULTIPLE VITAMINS-MINERALS (MULTI VITAMIN/MINERALS) TABS    Take 1 tablet by mouth at bedtime.   NITROGLYCERIN  (NITROSTAT ) 0.4 MG SL TABLET    Place one tablet (0.4 mg dose) under the tongue every 5 (five) minutes as needed  for Chest pain.   PANTOPRAZOLE  SODIUM (PROTONIX ) 40 MG TABLET    Take one tablet (40 mg dose) by mouth daily.   POTASSIUM CHLORIDE  (MICRO-K ) 10 MEQ CR CAPSULE    Take two capsules (20 mEq dose) by mouth.   PREDNISONE  (DELTASONE ) 10 MG TABLET    Take one tablet (10 mg dose) by mouth daily.   ROSUVASTATIN  CALCIUM  (CRESTOR ) 5 MG TABLET    Take one tablet (5 mg dose) by mouth at bedtime.   SPIRONOLACTONE  (ALDACTONE ) 25 MG TABLET    Take one half tablet (12.5 mg dose) by mouth daily.   TRUE METRIX BLOOD  GLUCOSE TEST TEST STRIP    SMARTSIG:Via Meter  Modified Medications   No medications on file  Discontinued Medications   HYDRALAZINE  HCL (APRESOLINE ) 10 MG TABLET    Take one tablet (10 mg dose) by mouth every 8 (eight) hours.   ZINC  SULFATE (ZINCATE) 220 (50 ZN) MG CAPSULE    Take one capsule (220 mg dose) by mouth.      Risks, benefits, and alternatives of the medications and treatment plan prescribed today were discussed, and patient expressed understanding. Plan follow-up as discussed or as needed if any worsening symptoms or change in condition.   A yearly preventative health exam was recommended and current age based recommendations were discussed.

## 2023-12-19 DIAGNOSIS — I5022 Chronic systolic (congestive) heart failure: Secondary | ICD-10-CM | POA: Diagnosis not present

## 2023-12-19 DIAGNOSIS — I25118 Atherosclerotic heart disease of native coronary artery with other forms of angina pectoris: Secondary | ICD-10-CM | POA: Diagnosis not present

## 2023-12-29 DIAGNOSIS — I2510 Atherosclerotic heart disease of native coronary artery without angina pectoris: Secondary | ICD-10-CM | POA: Diagnosis not present

## 2023-12-29 DIAGNOSIS — I10 Essential (primary) hypertension: Secondary | ICD-10-CM | POA: Diagnosis not present

## 2023-12-29 DIAGNOSIS — E782 Mixed hyperlipidemia: Secondary | ICD-10-CM | POA: Diagnosis not present

## 2023-12-29 DIAGNOSIS — E039 Hypothyroidism, unspecified: Secondary | ICD-10-CM | POA: Diagnosis not present

## 2023-12-29 DIAGNOSIS — I5022 Chronic systolic (congestive) heart failure: Secondary | ICD-10-CM | POA: Diagnosis not present

## 2023-12-29 DIAGNOSIS — K219 Gastro-esophageal reflux disease without esophagitis: Secondary | ICD-10-CM | POA: Diagnosis not present

## 2023-12-29 DIAGNOSIS — E11621 Type 2 diabetes mellitus with foot ulcer: Secondary | ICD-10-CM | POA: Diagnosis not present

## 2023-12-29 DIAGNOSIS — B356 Tinea cruris: Secondary | ICD-10-CM | POA: Diagnosis not present

## 2023-12-29 DIAGNOSIS — D860 Sarcoidosis of lung: Secondary | ICD-10-CM | POA: Diagnosis not present

## 2024-01-02 DIAGNOSIS — R001 Bradycardia, unspecified: Secondary | ICD-10-CM | POA: Diagnosis not present

## 2024-01-02 DIAGNOSIS — I5022 Chronic systolic (congestive) heart failure: Secondary | ICD-10-CM | POA: Diagnosis not present

## 2024-01-02 DIAGNOSIS — I25118 Atherosclerotic heart disease of native coronary artery with other forms of angina pectoris: Secondary | ICD-10-CM | POA: Diagnosis not present

## 2024-01-02 DIAGNOSIS — I739 Peripheral vascular disease, unspecified: Secondary | ICD-10-CM | POA: Diagnosis not present

## 2024-01-02 DIAGNOSIS — I10 Essential (primary) hypertension: Secondary | ICD-10-CM | POA: Diagnosis not present

## 2024-01-02 DIAGNOSIS — N1831 Chronic kidney disease, stage 3a: Secondary | ICD-10-CM | POA: Diagnosis not present

## 2024-01-02 DIAGNOSIS — I493 Ventricular premature depolarization: Secondary | ICD-10-CM | POA: Diagnosis not present

## 2024-01-08 DIAGNOSIS — N1831 Chronic kidney disease, stage 3a: Secondary | ICD-10-CM | POA: Diagnosis not present

## 2024-01-08 DIAGNOSIS — Z1339 Encounter for screening examination for other mental health and behavioral disorders: Secondary | ICD-10-CM | POA: Diagnosis not present

## 2024-01-08 DIAGNOSIS — G4733 Obstructive sleep apnea (adult) (pediatric): Secondary | ICD-10-CM | POA: Diagnosis not present

## 2024-01-08 DIAGNOSIS — I739 Peripheral vascular disease, unspecified: Secondary | ICD-10-CM | POA: Diagnosis not present

## 2024-01-08 DIAGNOSIS — I25118 Atherosclerotic heart disease of native coronary artery with other forms of angina pectoris: Secondary | ICD-10-CM | POA: Diagnosis not present

## 2024-01-08 DIAGNOSIS — G4719 Other hypersomnia: Secondary | ICD-10-CM | POA: Diagnosis not present

## 2024-01-08 DIAGNOSIS — I5032 Chronic diastolic (congestive) heart failure: Secondary | ICD-10-CM | POA: Diagnosis not present

## 2024-01-08 DIAGNOSIS — I10 Essential (primary) hypertension: Secondary | ICD-10-CM | POA: Diagnosis not present

## 2024-01-10 DIAGNOSIS — I739 Peripheral vascular disease, unspecified: Secondary | ICD-10-CM | POA: Diagnosis not present

## 2024-01-22 DIAGNOSIS — I10 Essential (primary) hypertension: Secondary | ICD-10-CM | POA: Diagnosis not present

## 2024-01-22 DIAGNOSIS — J069 Acute upper respiratory infection, unspecified: Secondary | ICD-10-CM | POA: Diagnosis not present

## 2024-01-25 DIAGNOSIS — G4733 Obstructive sleep apnea (adult) (pediatric): Secondary | ICD-10-CM | POA: Diagnosis not present

## 2024-01-25 DIAGNOSIS — R0683 Snoring: Secondary | ICD-10-CM | POA: Diagnosis not present

## 2024-02-06 DIAGNOSIS — E876 Hypokalemia: Secondary | ICD-10-CM | POA: Diagnosis not present

## 2024-02-08 DIAGNOSIS — R0683 Snoring: Secondary | ICD-10-CM | POA: Diagnosis not present

## 2024-02-08 DIAGNOSIS — G4733 Obstructive sleep apnea (adult) (pediatric): Secondary | ICD-10-CM | POA: Diagnosis not present

## 2024-02-08 DIAGNOSIS — Z7952 Long term (current) use of systemic steroids: Secondary | ICD-10-CM | POA: Diagnosis not present

## 2024-02-08 DIAGNOSIS — D860 Sarcoidosis of lung: Secondary | ICD-10-CM | POA: Diagnosis not present

## 2024-02-08 DIAGNOSIS — G4710 Hypersomnia, unspecified: Secondary | ICD-10-CM | POA: Diagnosis not present

## 2024-02-10 DIAGNOSIS — I255 Ischemic cardiomyopathy: Secondary | ICD-10-CM | POA: Diagnosis not present

## 2024-02-10 DIAGNOSIS — R079 Chest pain, unspecified: Secondary | ICD-10-CM | POA: Diagnosis not present

## 2024-02-10 DIAGNOSIS — I25118 Atherosclerotic heart disease of native coronary artery with other forms of angina pectoris: Secondary | ICD-10-CM | POA: Diagnosis not present

## 2024-02-10 DIAGNOSIS — I214 Non-ST elevation (NSTEMI) myocardial infarction: Secondary | ICD-10-CM | POA: Diagnosis not present

## 2024-02-10 DIAGNOSIS — R7989 Other specified abnormal findings of blood chemistry: Secondary | ICD-10-CM | POA: Diagnosis not present

## 2024-02-10 DIAGNOSIS — J189 Pneumonia, unspecified organism: Secondary | ICD-10-CM | POA: Diagnosis not present

## 2024-02-10 DIAGNOSIS — I5032 Chronic diastolic (congestive) heart failure: Secondary | ICD-10-CM | POA: Diagnosis not present

## 2024-02-10 DIAGNOSIS — E039 Hypothyroidism, unspecified: Secondary | ICD-10-CM | POA: Diagnosis not present

## 2024-02-10 DIAGNOSIS — J101 Influenza due to other identified influenza virus with other respiratory manifestations: Secondary | ICD-10-CM | POA: Diagnosis not present

## 2024-02-12 DIAGNOSIS — I498 Other specified cardiac arrhythmias: Secondary | ICD-10-CM | POA: Diagnosis not present

## 2024-02-12 DIAGNOSIS — I452 Bifascicular block: Secondary | ICD-10-CM | POA: Diagnosis not present

## 2024-02-12 DIAGNOSIS — I214 Non-ST elevation (NSTEMI) myocardial infarction: Secondary | ICD-10-CM | POA: Diagnosis not present

## 2024-02-12 DIAGNOSIS — I2510 Atherosclerotic heart disease of native coronary artery without angina pectoris: Secondary | ICD-10-CM | POA: Diagnosis not present

## 2024-02-12 DIAGNOSIS — T82855A Stenosis of coronary artery stent, initial encounter: Secondary | ICD-10-CM | POA: Diagnosis not present

## 2024-02-12 DIAGNOSIS — I491 Atrial premature depolarization: Secondary | ICD-10-CM | POA: Diagnosis not present

## 2024-02-13 DIAGNOSIS — I214 Non-ST elevation (NSTEMI) myocardial infarction: Secondary | ICD-10-CM | POA: Diagnosis not present

## 2024-02-13 DIAGNOSIS — J189 Pneumonia, unspecified organism: Secondary | ICD-10-CM | POA: Diagnosis not present

## 2024-02-13 DIAGNOSIS — B9689 Other specified bacterial agents as the cause of diseases classified elsewhere: Secondary | ICD-10-CM | POA: Diagnosis not present
